# Patient Record
Sex: Male | Born: 1955 | Race: White | Hispanic: No | Smoking: Never smoker
Health system: Southern US, Community
[De-identification: ages and names within clinical notes are randomized; demographics above are authoritative.]

## PROBLEM LIST (undated history)

## (undated) DIAGNOSIS — E119 Type 2 diabetes mellitus without complications: Secondary | ICD-10-CM

## (undated) DIAGNOSIS — K649 Unspecified hemorrhoids: Secondary | ICD-10-CM

## (undated) DIAGNOSIS — I10 Essential (primary) hypertension: Secondary | ICD-10-CM

## (undated) DIAGNOSIS — K219 Gastro-esophageal reflux disease without esophagitis: Secondary | ICD-10-CM

## (undated) DIAGNOSIS — S82244A Nondisplaced spiral fracture of shaft of right tibia, initial encounter for closed fracture: Secondary | ICD-10-CM

## (undated) DIAGNOSIS — F329 Major depressive disorder, single episode, unspecified: Secondary | ICD-10-CM

## (undated) DIAGNOSIS — F32A Depression, unspecified: Secondary | ICD-10-CM

## (undated) DIAGNOSIS — D369 Benign neoplasm, unspecified site: Secondary | ICD-10-CM

## (undated) DIAGNOSIS — D509 Iron deficiency anemia, unspecified: Secondary | ICD-10-CM

## (undated) DIAGNOSIS — H3530 Unspecified macular degeneration: Secondary | ICD-10-CM

## (undated) DIAGNOSIS — C61 Malignant neoplasm of prostate: Secondary | ICD-10-CM

## (undated) DIAGNOSIS — M109 Gout, unspecified: Secondary | ICD-10-CM

## (undated) HISTORY — PX: INGUINAL HERNIA REPAIR: SUR1180

## (undated) HISTORY — DX: Nondisplaced spiral fracture of shaft of right tibia, initial encounter for closed fracture: S82.244A

## (undated) HISTORY — DX: Iron deficiency anemia, unspecified: D50.9

## (undated) HISTORY — DX: Malignant neoplasm of prostate: C61

## (undated) HISTORY — DX: Major depressive disorder, single episode, unspecified: F32.9

## (undated) HISTORY — DX: Benign neoplasm, unspecified site: D36.9

## (undated) HISTORY — DX: Depression, unspecified: F32.A

## (undated) HISTORY — DX: Unspecified hemorrhoids: K64.9

## (undated) HISTORY — DX: Unspecified macular degeneration: H35.30

## (undated) HISTORY — PX: PROSTATE BIOPSY: SHX241

## (undated) HISTORY — DX: Gastro-esophageal reflux disease without esophagitis: K21.9

---

## 1979-03-19 HISTORY — PX: OTHER SURGICAL HISTORY: SHX169

## 1997-07-14 ENCOUNTER — Other Ambulatory Visit: Admission: RE | Admit: 1997-07-14 | Discharge: 1997-07-14 | Payer: Self-pay | Admitting: Family Medicine

## 1997-08-26 ENCOUNTER — Emergency Department (HOSPITAL_COMMUNITY): Admission: EM | Admit: 1997-08-26 | Discharge: 1997-08-26 | Payer: Self-pay

## 1998-02-07 ENCOUNTER — Emergency Department (HOSPITAL_COMMUNITY): Admission: EM | Admit: 1998-02-07 | Discharge: 1998-02-07 | Payer: Self-pay | Admitting: Emergency Medicine

## 1999-05-02 ENCOUNTER — Encounter: Admission: RE | Admit: 1999-05-02 | Discharge: 1999-05-02 | Payer: Self-pay | Admitting: Internal Medicine

## 1999-12-04 ENCOUNTER — Encounter: Admission: RE | Admit: 1999-12-04 | Discharge: 1999-12-04 | Payer: Self-pay | Admitting: Internal Medicine

## 1999-12-24 ENCOUNTER — Encounter: Payer: Self-pay | Admitting: Gastroenterology

## 1999-12-24 ENCOUNTER — Ambulatory Visit (HOSPITAL_COMMUNITY): Admission: RE | Admit: 1999-12-24 | Discharge: 1999-12-24 | Payer: Self-pay | Admitting: Gastroenterology

## 2000-01-12 ENCOUNTER — Emergency Department (HOSPITAL_COMMUNITY): Admission: EM | Admit: 2000-01-12 | Discharge: 2000-01-12 | Payer: Self-pay | Admitting: *Deleted

## 2000-04-01 ENCOUNTER — Inpatient Hospital Stay (HOSPITAL_COMMUNITY): Admission: EM | Admit: 2000-04-01 | Discharge: 2000-04-03 | Payer: Self-pay | Admitting: Internal Medicine

## 2000-04-02 ENCOUNTER — Encounter: Payer: Self-pay | Admitting: Internal Medicine

## 2000-06-17 ENCOUNTER — Encounter: Payer: Self-pay | Admitting: Family Medicine

## 2000-06-17 ENCOUNTER — Encounter: Admission: RE | Admit: 2000-06-17 | Discharge: 2000-06-17 | Payer: Self-pay | Admitting: Family Medicine

## 2000-08-14 ENCOUNTER — Ambulatory Visit (HOSPITAL_COMMUNITY): Admission: RE | Admit: 2000-08-14 | Discharge: 2000-08-14 | Payer: Self-pay | Admitting: Internal Medicine

## 2000-09-29 ENCOUNTER — Emergency Department (HOSPITAL_COMMUNITY): Admission: EM | Admit: 2000-09-29 | Discharge: 2000-09-29 | Payer: Self-pay | Admitting: Emergency Medicine

## 2000-09-30 ENCOUNTER — Emergency Department (HOSPITAL_COMMUNITY): Admission: EM | Admit: 2000-09-30 | Discharge: 2000-09-30 | Payer: Self-pay | Admitting: Emergency Medicine

## 2000-11-11 ENCOUNTER — Ambulatory Visit (HOSPITAL_COMMUNITY): Admission: RE | Admit: 2000-11-11 | Discharge: 2000-11-11 | Payer: Self-pay | Admitting: Internal Medicine

## 2000-11-11 ENCOUNTER — Encounter: Payer: Self-pay | Admitting: Internal Medicine

## 2001-03-29 ENCOUNTER — Encounter: Payer: Self-pay | Admitting: Emergency Medicine

## 2001-03-29 ENCOUNTER — Emergency Department (HOSPITAL_COMMUNITY): Admission: EM | Admit: 2001-03-29 | Discharge: 2001-03-29 | Payer: Self-pay | Admitting: Emergency Medicine

## 2001-05-26 ENCOUNTER — Emergency Department (HOSPITAL_COMMUNITY): Admission: EM | Admit: 2001-05-26 | Discharge: 2001-05-26 | Payer: Self-pay

## 2001-06-01 ENCOUNTER — Emergency Department (HOSPITAL_COMMUNITY): Admission: EM | Admit: 2001-06-01 | Discharge: 2001-06-01 | Payer: Self-pay | Admitting: Emergency Medicine

## 2001-08-09 ENCOUNTER — Inpatient Hospital Stay (HOSPITAL_COMMUNITY): Admission: EM | Admit: 2001-08-09 | Discharge: 2001-08-11 | Payer: Self-pay | Admitting: Emergency Medicine

## 2001-08-09 ENCOUNTER — Encounter: Payer: Self-pay | Admitting: Emergency Medicine

## 2001-10-29 ENCOUNTER — Emergency Department (HOSPITAL_COMMUNITY): Admission: EM | Admit: 2001-10-29 | Discharge: 2001-10-29 | Payer: Self-pay | Admitting: Emergency Medicine

## 2001-11-24 ENCOUNTER — Emergency Department (HOSPITAL_COMMUNITY): Admission: EM | Admit: 2001-11-24 | Discharge: 2001-11-24 | Payer: Self-pay | Admitting: Emergency Medicine

## 2001-11-24 ENCOUNTER — Encounter: Payer: Self-pay | Admitting: Emergency Medicine

## 2001-12-07 ENCOUNTER — Encounter: Payer: Self-pay | Admitting: Emergency Medicine

## 2001-12-07 ENCOUNTER — Emergency Department (HOSPITAL_COMMUNITY): Admission: EM | Admit: 2001-12-07 | Discharge: 2001-12-07 | Payer: Self-pay | Admitting: Emergency Medicine

## 2002-01-06 ENCOUNTER — Emergency Department (HOSPITAL_COMMUNITY): Admission: EM | Admit: 2002-01-06 | Discharge: 2002-01-06 | Payer: Self-pay | Admitting: Emergency Medicine

## 2002-05-05 ENCOUNTER — Emergency Department (HOSPITAL_COMMUNITY): Admission: EM | Admit: 2002-05-05 | Discharge: 2002-05-05 | Payer: Self-pay | Admitting: Emergency Medicine

## 2002-09-04 ENCOUNTER — Encounter: Payer: Self-pay | Admitting: Emergency Medicine

## 2002-09-04 ENCOUNTER — Emergency Department (HOSPITAL_COMMUNITY): Admission: EM | Admit: 2002-09-04 | Discharge: 2002-09-04 | Payer: Self-pay | Admitting: Emergency Medicine

## 2002-10-20 ENCOUNTER — Emergency Department (HOSPITAL_COMMUNITY): Admission: EM | Admit: 2002-10-20 | Discharge: 2002-10-20 | Payer: Self-pay | Admitting: Emergency Medicine

## 2002-12-08 ENCOUNTER — Emergency Department (HOSPITAL_COMMUNITY): Admission: EM | Admit: 2002-12-08 | Discharge: 2002-12-08 | Payer: Self-pay | Admitting: Emergency Medicine

## 2002-12-09 ENCOUNTER — Encounter: Payer: Self-pay | Admitting: Emergency Medicine

## 2002-12-09 ENCOUNTER — Emergency Department (HOSPITAL_COMMUNITY): Admission: AD | Admit: 2002-12-09 | Discharge: 2002-12-09 | Payer: Self-pay | Admitting: Emergency Medicine

## 2003-01-20 ENCOUNTER — Encounter: Admission: RE | Admit: 2003-01-20 | Discharge: 2003-01-20 | Payer: Self-pay | Admitting: Family Medicine

## 2003-01-24 ENCOUNTER — Encounter: Admission: RE | Admit: 2003-01-24 | Discharge: 2003-01-24 | Payer: Self-pay | Admitting: Family Medicine

## 2004-09-06 ENCOUNTER — Ambulatory Visit: Payer: Self-pay | Admitting: Nurse Practitioner

## 2005-10-25 ENCOUNTER — Emergency Department (HOSPITAL_COMMUNITY): Admission: EM | Admit: 2005-10-25 | Discharge: 2005-10-25 | Payer: Self-pay | Admitting: Emergency Medicine

## 2006-04-24 ENCOUNTER — Emergency Department (HOSPITAL_COMMUNITY): Admission: EM | Admit: 2006-04-24 | Discharge: 2006-04-24 | Payer: Self-pay | Admitting: Emergency Medicine

## 2007-06-18 ENCOUNTER — Ambulatory Visit: Payer: Self-pay | Admitting: Family Medicine

## 2007-06-18 LAB — CONVERTED CEMR LAB
ALT: 45 units/L (ref 0–53)
AST: 27 units/L (ref 0–37)
Albumin: 4.8 g/dL (ref 3.5–5.2)
Alkaline Phosphatase: 89 units/L (ref 39–117)
BUN: 17 mg/dL (ref 6–23)
Basophils Absolute: 0 10*3/uL (ref 0.0–0.1)
Basophils Relative: 0 % (ref 0–1)
CO2: 21 meq/L (ref 19–32)
Calcium: 9.9 mg/dL (ref 8.4–10.5)
Chlamydia, Swab/Urine, PCR: NEGATIVE
Chloride: 101 meq/L (ref 96–112)
Creatinine, Ser: 0.96 mg/dL (ref 0.40–1.50)
Eosinophils Absolute: 0.2 10*3/uL (ref 0.0–0.7)
Eosinophils Relative: 2 % (ref 0–5)
GC Probe Amp, Urine: NEGATIVE
Glucose, Bld: 105 mg/dL — ABNORMAL HIGH (ref 70–99)
HCT: 52 % (ref 39.0–52.0)
Hemoglobin: 17.9 g/dL — ABNORMAL HIGH (ref 13.0–17.0)
Lymphocytes Relative: 25 % (ref 12–46)
Lymphs Abs: 2 10*3/uL (ref 0.7–4.0)
MCHC: 34.4 g/dL (ref 30.0–36.0)
MCV: 85.1 fL (ref 78.0–100.0)
Monocytes Absolute: 0.7 10*3/uL (ref 0.1–1.0)
Monocytes Relative: 9 % (ref 3–12)
Neutro Abs: 5.1 10*3/uL (ref 1.7–7.7)
Neutrophils Relative %: 64 % (ref 43–77)
PSA: 4.29 ng/mL — ABNORMAL HIGH (ref 0.10–4.00)
Platelets: 269 10*3/uL (ref 150–400)
Potassium: 3.9 meq/L (ref 3.5–5.3)
RBC: 6.11 M/uL — ABNORMAL HIGH (ref 4.22–5.81)
RDW: 13.2 % (ref 11.5–15.5)
Sodium: 137 meq/L (ref 135–145)
Total Bilirubin: 0.7 mg/dL (ref 0.3–1.2)
Total Protein: 7.8 g/dL (ref 6.0–8.3)
WBC: 8 10*3/uL (ref 4.0–10.5)

## 2008-04-18 ENCOUNTER — Inpatient Hospital Stay (HOSPITAL_COMMUNITY): Admission: EM | Admit: 2008-04-18 | Discharge: 2008-04-26 | Payer: Self-pay | Admitting: Emergency Medicine

## 2008-08-18 ENCOUNTER — Ambulatory Visit: Payer: Self-pay | Admitting: Family Medicine

## 2008-08-18 LAB — CONVERTED CEMR LAB
BUN: 18 mg/dL (ref 6–23)
CO2: 25 meq/L (ref 19–32)
Calcium: 9.7 mg/dL (ref 8.4–10.5)
Chloride: 103 meq/L (ref 96–112)
Creatinine, Ser: 0.95 mg/dL (ref 0.40–1.50)
Glucose, Bld: 130 mg/dL — ABNORMAL HIGH (ref 70–99)
Microalb, Ur: 2.18 mg/dL — ABNORMAL HIGH (ref 0.00–1.89)
PSA: 4.6 ng/mL — ABNORMAL HIGH (ref 0.10–4.00)
Potassium: 3.9 meq/L (ref 3.5–5.3)
Sodium: 142 meq/L (ref 135–145)
Vit D, 25-Hydroxy: 56 ng/mL (ref 30–89)

## 2009-07-14 ENCOUNTER — Ambulatory Visit: Payer: Self-pay | Admitting: Family Medicine

## 2009-08-16 DIAGNOSIS — C61 Malignant neoplasm of prostate: Secondary | ICD-10-CM

## 2009-08-16 HISTORY — DX: Malignant neoplasm of prostate: C61

## 2009-09-07 ENCOUNTER — Ambulatory Visit: Payer: Self-pay | Admitting: Family Medicine

## 2010-04-07 ENCOUNTER — Encounter: Payer: Self-pay | Admitting: Family Medicine

## 2010-07-03 LAB — BASIC METABOLIC PANEL
BUN: 10 mg/dL (ref 6–23)
BUN: 9 mg/dL (ref 6–23)
CO2: 29 mEq/L (ref 19–32)
Calcium: 8.9 mg/dL (ref 8.4–10.5)
Chloride: 103 mEq/L (ref 96–112)
Glucose, Bld: 103 mg/dL — ABNORMAL HIGH (ref 70–99)
Glucose, Bld: 95 mg/dL (ref 70–99)
Potassium: 3.9 mEq/L (ref 3.5–5.1)
Potassium: 4 mEq/L (ref 3.5–5.1)
Sodium: 135 mEq/L (ref 135–145)

## 2010-07-03 LAB — URINALYSIS, ROUTINE W REFLEX MICROSCOPIC
Bilirubin Urine: NEGATIVE
Glucose, UA: NEGATIVE mg/dL
Hgb urine dipstick: NEGATIVE
Ketones, ur: NEGATIVE mg/dL
Nitrite: NEGATIVE
Protein, ur: NEGATIVE mg/dL
Specific Gravity, Urine: 1.027 (ref 1.005–1.030)
Urobilinogen, UA: 0.2 mg/dL (ref 0.0–1.0)
pH: 5.5 (ref 5.0–8.0)

## 2010-07-03 LAB — POCT I-STAT, CHEM 8
BUN: 23 mg/dL (ref 6–23)
Calcium, Ion: 1.02 mmol/L — ABNORMAL LOW (ref 1.12–1.32)
Chloride: 107 mEq/L (ref 96–112)
Creatinine, Ser: 0.9 mg/dL (ref 0.4–1.5)
Glucose, Bld: 109 mg/dL — ABNORMAL HIGH (ref 70–99)
HCT: 56 % — ABNORMAL HIGH (ref 39.0–52.0)
Hemoglobin: 19 g/dL — ABNORMAL HIGH (ref 13.0–17.0)
Potassium: 3.5 mEq/L (ref 3.5–5.1)
Sodium: 138 mEq/L (ref 135–145)
TCO2: 23 mmol/L (ref 0–100)

## 2010-07-03 LAB — COMPREHENSIVE METABOLIC PANEL
ALT: 29 U/L (ref 0–53)
AST: 20 U/L (ref 0–37)
Albumin: 3 g/dL — ABNORMAL LOW (ref 3.5–5.2)
Alkaline Phosphatase: 61 U/L (ref 39–117)
BUN: 12 mg/dL (ref 6–23)
CO2: 25 mEq/L (ref 19–32)
Calcium: 8.3 mg/dL — ABNORMAL LOW (ref 8.4–10.5)
Chloride: 106 mEq/L (ref 96–112)
Creatinine, Ser: 0.89 mg/dL (ref 0.4–1.5)
GFR calc Af Amer: >60 mL/min (ref >60)
GFR calc non Af Amer: >60 mL/min (ref >60)
Glucose, Bld: 89 mg/dL (ref 70–99)
Potassium: 3.6 mEq/L (ref 3.5–5.1)
Sodium: 138 mEq/L (ref 135–145)
Total Bilirubin: 0.9 mg/dL (ref 0.3–1.2)
Total Protein: 5.5 g/dL — ABNORMAL LOW (ref 6.0–8.3)

## 2010-07-03 LAB — ETHANOL: Alcohol, Ethyl (B): 6 mg/dL (ref 0–10)

## 2010-07-03 LAB — CBC
HCT: 42.8 % (ref 39.0–52.0)
HCT: 51.8 % (ref 39.0–52.0)
Hemoglobin: 14.4 g/dL (ref 13.0–17.0)
Hemoglobin: 17.6 g/dL — ABNORMAL HIGH (ref 13.0–17.0)
MCHC: 33.8 g/dL (ref 30.0–36.0)
MCHC: 34 g/dL (ref 30.0–36.0)
MCV: 83.4 fL (ref 78.0–100.0)
MCV: 84.3 fL (ref 78.0–100.0)
Platelets: 215 10*3/uL (ref 150–400)
Platelets: 220 10*3/uL (ref 150–400)
RBC: 5.08 MIL/uL (ref 4.22–5.81)
RBC: 6.21 MIL/uL — ABNORMAL HIGH (ref 4.22–5.81)
RDW: 13.9 % (ref 11.5–15.5)
RDW: 14.4 % (ref 11.5–15.5)
WBC: 6.6 10*3/uL (ref 4.0–10.5)
WBC: 6.6 10*3/uL (ref 4.0–10.5)

## 2010-07-03 LAB — PROTIME-INR
INR: 0.9 (ref 0.00–1.49)
Prothrombin Time: 12.7 seconds (ref 11.6–15.2)

## 2010-07-03 LAB — TYPE AND SCREEN
ABO/RH(D): O POS
Antibody Screen: NEGATIVE

## 2010-07-03 LAB — POCT CARDIAC MARKERS
CKMB, poc: 3.6 ng/mL (ref 1.0–8.0)
Myoglobin, poc: 71.4 ng/mL (ref 12–200)
Troponin i, poc: <0.05 ng/mL (ref 0.00–0.09)

## 2010-07-03 LAB — RETICULOCYTES
RBC.: 5.26 MIL/uL (ref 4.22–5.81)
Retic Count, Absolute: 68.4 10*3/uL (ref 19.0–186.0)
Retic Ct Pct: 1.3 % (ref 0.4–3.1)

## 2010-07-03 LAB — RAPID URINE DRUG SCREEN, HOSP PERFORMED
Amphetamines: NOT DETECTED
Barbiturates: NOT DETECTED
Benzodiazepines: POSITIVE — AB
Cocaine: NOT DETECTED
Opiates: NOT DETECTED
Tetrahydrocannabinol: NOT DETECTED

## 2010-07-03 LAB — LIPID PANEL
Cholesterol: 156 mg/dL (ref 0–200)
HDL: 25 mg/dL — ABNORMAL LOW (ref >39)
LDL Cholesterol: 105 mg/dL — ABNORMAL HIGH (ref 0–99)
Total CHOL/HDL Ratio: 6.2 RATIO
Triglycerides: 129 mg/dL (ref <150)
VLDL: 26 mg/dL (ref 0–40)

## 2010-07-03 LAB — T4, FREE: Free T4: 0.85 ng/dL — ABNORMAL LOW (ref 0.89–1.80)

## 2010-07-03 LAB — ABO/RH: ABO/RH(D): O POS
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
## 2010-07-31 NOTE — Discharge Summary (Signed)
[name], [name]                  ACCOUNT NO.:  [id number]   MEDICAL RECORD NO.:  [id number]          PATIENT TYPE:  INP   LOCATION:  3030                         FACILITY:  [facility]   PHYSICIAN:  Monte Fantasia, MD  DATE OF BIRTH:  [birth year]   DATE OF ADMISSION:  04/18/2008  DATE OF DISCHARGE:                               DISCHARGE SUMMARY   ADDENDUM:  Please refer to the prior discharge summary dictated on  April 22, 2008.  This is an addendum to the same.   PRIMARY CARE PHYSICIAN:  Unassigned.   DISCHARGE DIAGNOSES:  1. Syncope.  2. Status post fall.  3. Post-concussion disorder.  4. Chronic headaches.  5. History of polio of the right leg and spasticity of the left leg.  6. Dizziness secondary to post-concussion.  7. Memory dysfunction .  8. Bipolar affective disorder.  9. Bleeding secondary to concussion.   DISCHARGE MEDICATIONS:  1. Metoprolol 50 mg p.o. b.i.d.  2. Benicar 40 mg p.o. daily.  3. Vicodin 5/325 mg 2 tablets p.o. q.i.d. p.r.n. same.  4. Hold Ativan 1-2 mg p.o. t.i.d. p.r.n. agitation.   HOSPITAL COURSE:  Please refer to the prior discharge summary dictated  on April 22, 2008 for the course during the hospital stay.  This is an  addendum to the same.  Over the past 3 days, the patient has been  awaiting for skilled nursing home placement.  The patient received  physical therapy and speech therapy.  The patient's the mobility and  pain has improved over the past 3 days.  The patient at present is  medically stable to be discharged to a skilled nursing facility and we  will discharge the same.   PHYSICAL EXAMINATION:  VITAL SIGNS:  Temperature 97.6, pulse of 51,  respirations 20, blood pressure 119/72.  NECK:  Supple.  HEENT:  Pupils are equal and reacting to light.  No pallor, no  lymphadenopathy.  RESPIRATORY:  Air entry is bilaterally equal.  No  rales or rhonchi.  CARDIOVASCULAR:  S1 and S2, regular rate and rhythm.  ABDOMEN:  Soft.  No  guarding.  No rigidity.  No tenderness.  EXTREMITIES:  No edema of feet.   LABORATORY DATA:  No new labs.   DIAGNOSTICS:  Please refer to the prior discharge summary dictated for  the same.   DISPOSITION:  The patient is stable to be discharged to the skilled  nursing facility.  As per the Neurology recommendations, would avoid  narcotics long term and use of nonsteroidal anti-inflammatory agents.  Recommend heating pad for lower back pain control and physical therapy  with rehab.  The patient is medically stable to be discharged to the  nursing home and is awaiting discharge.      Monte Fantasia, MD  Electronically Signed     MP/MEDQ  D:  04/26/2008  T:  04/26/2008  Job:  [id number]
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
## 2010-08-03 NOTE — Cardiovascular Report (Signed)
[place]. [facility]  Patient:    [name], [name]                    MRN: [id number] Proc. Date: 04/03/00 Adm. Date:  [id number] Disc. Date: [id number] Attending:  Gwynneth Aliment CC:         Velna Hatchet, M.D.  CP aboratory  Madaline Savage, M.D.   Cardiac Catheterization  PROCEDURE:  Retrograde central aortic catheterization, selective coronary angiography by Judkins technique, left ventricular angiogram by RAO and LAO projections, subselective left internal mammary artery, abdominal aortic angiogram, mid stream pulmonary artery projection, hand injection, Perclose 6 French arterial closure.  DESCRIPTION OF PROCEDURE:  The above procedure was done with the patient in the postabsorptive state after 5 mg of Valium p.o. premedication at [facility] lab #1.  The RFA was entered with a single anterior puncture using 1% Xylocaine as anesthesia and modified Seldinger technique with an 18 thin-walled needle and insertion of a short 6 French side-arm sheath.  Versed 2 mg were given for sedation.  The patient was quite talkative but cooperative and oriented throughout the procedure which he tolerated well.  Catheters were 6 French 4 cm taper, Cordis preformed coronary and pigtail catheters using Hexabrix dye throughout the procedure.  LV angiogram was done in the RAO and LAO projection at 25 cc, 14 cc/sec., 20 cc at 12 cc/sec., respectively, with pullback pressure of the CA showing no gradient across the aortic valve.  Subselective LIMA was done with the right coronary catheter. Hand injection above the level of the renal arteries was done with the pigtail catheter.  The catheter was removed.  Side-arm sheath was flushed. Arteriotomy was closed successfully with a 6 [place] single staged Perclose device.  The patient was transferred to the holding area for postoperative care in stable condition and will be transferred back to [facility] for postoperative care and  monitoring.  He tolerated the procedure well.  PRESSURES:  LV:  130/0, LVEDP 14-16 mmHg. CA:  130/90 mmHg.  There was no gradient across the aortic valve on catheter pullback.  FLUOROSCOPY:  Fluoroscopy did not reveal any coronary ______ intracardiac or valvular calcification.  The LIMA was widely patent with normal subclavian and normal antegrade left vertebral flow.  The renal arteries were single bilaterally and normal and the suprarenal and infrarenal aorta appeared normal.  LEFT VENTRICULAR ANGIOGRAM:  LV angiogram in the RAO and LAO projections showed a vigorously contracting ventricle with EF greater than 55%.  No segmental wall motion abnormality with ______ to speech and mild angiographic LVH.  There was mitral regurgitation.  The main left coronary was normal.  The left anterior descending artery was widely patent, smooth and normal, slightly tapered in the distal third.  There was one area of approximately 30% concentric narrowing of the LAD just beyond the second septal perforator branch near the junction of the mid LAD.  This was used in multiple projections, did not change after nitroglycerin and was considered a mild narrowing with normal flow.  The large first diagonal branch was widely patent, smooth, and normal, bifurcated and arose before the first septal perforator.  The circumflex was nondominant, of moderate size, comprised of two large tortuous marginal branches that were normal and normal atrial and ABG branch.  The right coronary was a large dominant vessel that was widely patent, smooth and normal throughout its course with normal PDA and PLA.  DISCUSSION:  A 55 year old, single, disabled gentleman who has  a history of anxiety and depression, macular degeneration and systemic hypertension.  He is a non smoker.  He was admitted to the hospital with chest pain without myocardial infarction.  There was strong risk factors including a strong family  history of coronary disease, along with the patients hypertension.  He was seen in consultation by Dr. Elsie Lincoln who recommended diagnostic catheterization for further evaluation.  Fortunately, the patient has essentially normal coronary arteries and LV and he is a non smoker.  He does have some very minor disease in the junction of the proximal and mid-third of the LAD where there is some mild concentric narrowing that does not appear to be hemodynamically significant, but may be early atherosclerotic lesion.  I would recommend medical therapy of this.  In view of this, and his hypertension and the patients resting tachycardia, I would use beta blockade therapy if possible and careful attention to a lipid-lowering therapy.  CATHETERIZATION DIAGNOSES: 1. Chest pain, etiology not determined. 2. Possible gastroesophageal reflux disease. 3. Anxiety and depression. 4. Systemic hypertension. 5. Essentially normal coronary arteries (mild left anterior descending    disease). 6. Possible manic depressive disorder. 7. Lipid status to be determined. DD:  04/03/00 TD:  04/04/00 Job: [id number]

## 2010-08-03 NOTE — Cardiovascular Report (Signed)
[place]. [facility]  Patient:    [name], [name] Visit Number: [id number] MRN: [id number]          Service Type: MED Location: [phone] Attending Physician:  Netta Cedars Dictated by:   Delrae Rend, M.D. Proc. Date: 08/11/01 Admit Date:  08/09/2001 Discharge Date: 08/11/2001   CC:         Cala Bradford R. Renae Gloss, M.D.   Cardiac Catheterization  REFERRING PHYSICIAN: Merlene Laughter. Renae Gloss, M.D.  PROCEDURES PERFORMED: 1. Left ventriculography. 2. Selective right and left coronary arteriography. 3. Bilateral selective renal arteriography. 4. Right femoral angiography and closure of the right femoral artery access    with Perclose.  INDICATIONS: The patient is a 55 year old gentleman with a known history of coronary artery disease with 30% mid LAD stenosis, presents to the hospital with chest pain. The chest pain was suggestive of unstable angina. Given this, he was admitted to the hospital and brought back to the cardiac catheterization lab to evaluate his coronary anatomy.  Renal arteriography was performed to evaluate for renovascular hypertension as he had uncontrolled hypertension in spite of aggressive medical therapy.  HEMODYNAMIC DATA: The LV pressures were 154/6 with an end-diastolic pressure of 15 mmHg. The central aortic pressure 142/96 with a mean of 119 mmHg. There was no significant pressure gradient across the aortic valve.  LEFT VENTRICLE:  The left ventricular systolic function is preserved at 60%. There is no wall motion abnormalities. There is no mitral regurgitation.  CORONARY ARTERIES:  Right coronary artery: Right coronary artery is a large caliber vessel and dominant vessel. It gives origin to a large PLV and a larger PDA branch. They are disease free.  Left main coronary artery: Left main coronary artery is a large caliber vessel. It is a long segment vessel and gives origin to LAD and circumflex coronary  artery.  Left anterior descending artery. Left anterior descending artery is a large caliber vessel.  It gives origin to a very large diagonal #1, which acts likes an optional LAD. The LAD entered the apex. It gives origin to two large septal perforators. Just after the origin of the optional LAD (diagonal #1, there is a 20-30% smooth narrowing noted, which may represent negative remodeling). There is no obvious plaque noted.  Circumflex coronary artery:  Circumflex coronary artery is a nondominant vessel; however, a large caliber vessel. It gives origin to a very large obtuse marginal #1 and obtuse marginal #2 and continues in the AV groove circumflex, which is very small.  Right and left selective renal arteriography. The renal arteriography revealed widely patent renal arteries with no evidence of renal artery stenosis.  Right femoral angiography.  Good arterial axis site.  The artery axis site was attempted to close with Perclose, but there was inability to properly close, and hence the procedure was unsuccessful. Manual pressure was held in the cardiac catheterization lab. Adequate hemostasis was obtained. The patient was transferred to the ward in stable condition.  TECHNIQUE OF THE PROCEDURE: Under the usual sterile precautions using a 6 French left femoral artery access, a 6 [place] multipurpose B2 catheter was advanced in the ascending aorta over a 0.035 mm J wire. The catheter was gently advanced in the left ventricle and left ventricular pressures were monitored. Hand contrast injections of the left ventricle was performed both in LAO and RAO projections. The catheter was flushed with saline, pulled back in the ascending aorta and pressure gradient of the ______ valve was monitored. The right  coronary artery was selective engaged and arteriography was performed. In a similar fashion, the left main coronary artery was selectively engaged and arteriography was performed. Then  the catheter was pulled back in the abdominal aorta. Selective right and left renal arteriography was performed and the catheter was pulled out of the body in the usual fashion. Right femoral angiography was performed through the arterial axis sheath, and after confirming the good axis, Perclose was attempted. Because of inability to close, manual pressure was held. Adequate hemostasis obtained. The patient was transferred to the ward in stable condition. Dictated by:   Delrae Rend, M.D. Attending Physician:  Netta Cedars DD:  08/11/01 TD:  08/12/01 Job: [id number]

## 2010-08-03 NOTE — Discharge Summary (Signed)
[name]. [facility]  Patient:    [name], [name] Visit Number: [id number] MRN: [id number]          Service Type: MED Location: [phone] Attending Physician:  Netta Cedars Dictated by:   Halford Decamp Delanna Ahmadi, R.N., N.P. Admit Date:  08/09/2001 Discharge Date: 08/11/2001   CC:         Cala Bradford R. Renae Gloss, M.D.   Discharge Summary  HISTORY OF PRESENT ILLNESS:  The patient is a 55 year old white single male who came to the emergency room by EMS because of chest pain. He stated he awakened yesterday with soreness in his chest and felt maybe he slept wrong and thus he went about his usual day. He drank some coffee at home, went to [facility] and to [place]. He was watching the race. He felt his chest suddenly become heavy, sharp with the pain. It was worse with increased respirations. He had no radiation. He did not have any symptoms of diaphoresis, shortness of breath or nausea or vomiting. He has had this discomfort off and on but not this bad.  He walked around in [employer]. He had no change, so 911 was called and the paramedics came and gave him sublingual nitroglycerin x 2 and he had decreased pain to 1/2; he rated it 9/10 initially. Thus he was seen in the emergency room by Dr. Arna Snipe who thought he should be admitted, ruled out for an MI, and with possible catheterization. He continued to have some intermittent chest pain, thus it was decided that he should undergo cardiac catheterization on Aug 11, 2001. He underwent catheterization. He was found only to have a 30% LAD, unchanged from a catheterization on March 29, 2000. Thus he was discharged to home.  LABORATORY DATA:  His hemoglobin was 13.6, hematocrit 39.8, WBCs 5.8, platelets 247. Sodium 137, potassium 4.2, BUN 8, glucose 105, creatinine 0.9. CK-MBs were negative x 2, troponin was negative x 2. Total cholesterol was 180, triglycerides 380, HDL 30, LDL 74. Urine was negative  for any growth.  DISCHARGE DIAGNOSES 1. Chest pain, not ischemic coronary artery disease related, status post    catheterization with only a very mild 30% LAD stenosis, unchanged from one    year ago. 2. Hypertension. 3. Anxiety, depression, bipolar disorder. 4. Macular degeneration. 5. Hyperlipidemia. 6. Premature family history of heart disease.  DISCHARGE MEDICATIONS 1. Accupril 60 mg 1 q.d. 2. Metoprolol 50 mg b.i.d. 3. Depakote 250 mg b.i.d. 4. Aspirin 81 mg 1 q.d. 5. Vioxx 25 mg q.d. x 10 days. 6. Prevacid 30 mg q.d.  DISCHARGE INSTRUCTIONS:  Activity as tolerated.  FOLLOWUP:  He is to follow up with the nurse practitioner in the office to check his groin. Otherwise he can follow up with his primary care doctor. There is no need for cardiology follow up on a long-term basis, because he basically has no significant coronary artery disease at this time. Dictated by:   Halford Decamp Delanna Ahmadi, R.N., N.P. Attending Physician:  Netta Cedars DD:  09/02/01 TD:  09/03/01 Job: [id number]

## 2010-08-03 NOTE — Discharge Summary (Signed)
[facility]  Patient:    [name], [name]                    MRN: [id number] Adm. Date:  04/01/00 Disc. Date: 04/03/00 Attending:  Ruta Hinds CC:         Lenise Herald, M.D.                           Discharge Summary  CHIEF COMPLAINT:  "I have chest pain."  HISTORY OF PRESENT ILLNESS:  This is a 55 year old male with a past medical history significant for hypertension, depression, and anxiety who presented to the office with substernal sharp stabbing chest pain. He states that at time it radiates to the epigastric area. Associated symptoms include diaphoresis and nausea. There is on shortness of breath or palpitations. The patient denied prior workup for his complaint. He denies previous history of coronary artery disease and angina. The pain is currently 5/10, no relief with Aleve, however, or Aleve with BC powder. There are no aggravating factors. The patient was transferred to [facility] for chest pain.  PAST MEDICAL HISTORY:  Anxiety, depression, hypertension.  PAST SURGICAL HISTORY:  Left ankle surgery 1982, umbilical hernia repair at age 66.  MEDICATIONS:  Norvasc 10 q.d., Depakote 250 b.i.d., Aceon 4 q.d., and Xanax 0.5 mg p.o. b.i.d.  ALLERGIES:  No known allergies.  FAMILY HISTORY:  Significant for father at 37 who had an MI at 80 and a CABG at 63. Hypertension, diabetes. Mother deceased of lung cancer. One brother and two sisters, their health is unknown.  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 Denies tobacco use. He has a history of alcohol abuse; however, he stopped drinking about 12 years ago and he denied illicit drug use.  REVIEW OF SYSTEMS:  Negative for headache, visual disturbances, ear or mouth pain, upper extremity or lower extremity weakness, or paresthesias. Abdominal pain, nausea, vomiting, diarrhea, constipation.  PHYSICAL EXAMINATION:  VITAL  SIGNS:  Temperature 99.1, heart rate 108, height 5 foot 6, ______ pounds. Blood pressure on the left 148/102, on the right 152/100. Respiratory rate of 18.  GENERAL:  Well-developed male appears agitated.  HEENT:  Normocephalic, atraumatic. ______ clear. Clear tympanic membranes, full extraocular movement without nystagmus. No oral pharyngeal lesions.  NECK:  Supple. No JVD. Normal carotid upstrokes without carotid bruits.  CHEST:  Clear to auscultation, no wheezes.  CARDIOVASCULAR:  Tachycardic without murmur. Normal S1 and S2. Questionable S4.  ABDOMEN:  Soft, nontender, nondistended with normal abdominal bowel sounds. No bruits appreciated.  RECTAL/GU:  Deferred.  EXTREMITIES:  No edema. Distal pulses palpable.  NEUROLOGIC:  He was awake and alert, oriented x 3, however, he appeared agitated. Cranial nerves II-XII are grossly intact. He moves all extremities. Had a normal gait and speaks with a lisp.  LABORATORY DATA:  Initially it EKG of normal sinus rhythm at 74 beats per minute. No acute ST/T wave changes. Initial labs in the hospital showed a cholesterol of 211, triglycerides 157. HDL 138, LDL of 142. CK 165 with an MB of 3.6. Negative troponin. White count 8.7, hemoglobin 16.5, hematocrit 45.2, platelets 312. Normal BMP and normal LFTs.  PLAN:  Admit this 55 year old male with cardiac risk factors including family history and high cholesterol with a sedentary lifestyle and uncontrolled hypertension. Telemetry to rule out myocardial infarction. He was admitted to telemetry. Cardiology was consulted to see the  patient. He was started on aspirin and a Beta blocker to decrease his heart rate. For his depression and  anxiety, Depakote and Xanax was continued. Hypertension was uncontrolled on admission. Norvasc and Zestril were continued. Norvasc 10 q.d. and Zestril 20 q.d. Lopressor was added to help control the blood pressure and the heart rate. On the next day, January  16, the patient was comfortable without chest pain overnight. He was then seen by cardiology who recommended that the patient have a cardiac cath. It was scheduled for January 17 and the patient was found to have mild coronary artery disease that could be medically managed. He was discharged on the 17th in stable condition. He was to follow-up with Dr. Allyne Gee on January 24 at 11:15 a.m. and cardiology as needed. He was discharged in stable condition.  DISCHARGE MEDICATIONS: 1. Depakote 250 p.o. b.i.d. 2. Xanax 0.5 mg p.o. b.i.d. 3. Lopressor 50 mg p.o. b.i.d. 4. Norvasc 10 mg p.o. q.d. 5. Zestril 20 mg p.o. q.d. 6. Nexium 40 mg p.o. q.d. 7. Aspirin 81 mg p.o. q.d. 8. Lipitor was added for his hypercholesterolemia at 10 mg p.o. q.d.  DISCHARGE DIAGNOSES: 1. Chest pain. 2. Urinary retention. 3. Hypertension. 4. Depression. 5. Anxiety. 6. Hyperlipidemia. DD:  05/14/00 TD:  05/15/00 Job: [id number]

## 2011-04-02 DIAGNOSIS — C61 Malignant neoplasm of prostate: Secondary | ICD-10-CM | POA: Diagnosis not present

## 2011-04-02 DIAGNOSIS — F329 Major depressive disorder, single episode, unspecified: Secondary | ICD-10-CM | POA: Diagnosis not present

## 2011-04-02 DIAGNOSIS — F411 Generalized anxiety disorder: Secondary | ICD-10-CM | POA: Diagnosis not present

## 2011-04-02 DIAGNOSIS — Z0389 Encounter for observation for other suspected diseases and conditions ruled out: Secondary | ICD-10-CM | POA: Diagnosis not present

## 2011-04-02 DIAGNOSIS — I10 Essential (primary) hypertension: Secondary | ICD-10-CM | POA: Diagnosis not present

## 2011-04-03 DIAGNOSIS — Z0000 Encounter for general adult medical examination without abnormal findings: Secondary | ICD-10-CM | POA: Diagnosis not present

## 2011-04-03 DIAGNOSIS — E559 Vitamin D deficiency, unspecified: Secondary | ICD-10-CM | POA: Diagnosis not present

## 2011-04-03 DIAGNOSIS — C61 Malignant neoplasm of prostate: Secondary | ICD-10-CM | POA: Diagnosis not present

## 2011-04-03 DIAGNOSIS — F411 Generalized anxiety disorder: Secondary | ICD-10-CM | POA: Diagnosis not present

## 2011-04-03 DIAGNOSIS — Z79899 Other long term (current) drug therapy: Secondary | ICD-10-CM | POA: Diagnosis not present

## 2011-04-03 DIAGNOSIS — I10 Essential (primary) hypertension: Secondary | ICD-10-CM | POA: Diagnosis not present

## 2011-04-18 DIAGNOSIS — L570 Actinic keratosis: Secondary | ICD-10-CM | POA: Diagnosis not present

## 2011-04-18 DIAGNOSIS — L259 Unspecified contact dermatitis, unspecified cause: Secondary | ICD-10-CM | POA: Diagnosis not present

## 2011-04-18 DIAGNOSIS — L219 Seborrheic dermatitis, unspecified: Secondary | ICD-10-CM | POA: Diagnosis not present

## 2011-04-18 DIAGNOSIS — D235 Other benign neoplasm of skin of trunk: Secondary | ICD-10-CM | POA: Diagnosis not present

## 2011-06-24 DIAGNOSIS — I739 Peripheral vascular disease, unspecified: Secondary | ICD-10-CM | POA: Diagnosis not present

## 2011-06-24 DIAGNOSIS — L84 Corns and callosities: Secondary | ICD-10-CM | POA: Diagnosis not present

## 2011-06-24 DIAGNOSIS — L608 Other nail disorders: Secondary | ICD-10-CM | POA: Diagnosis not present

## 2011-06-26 DIAGNOSIS — C61 Malignant neoplasm of prostate: Secondary | ICD-10-CM | POA: Diagnosis not present

## 2011-06-26 DIAGNOSIS — N529 Male erectile dysfunction, unspecified: Secondary | ICD-10-CM | POA: Diagnosis not present

## 2011-07-10 DIAGNOSIS — C61 Malignant neoplasm of prostate: Secondary | ICD-10-CM | POA: Diagnosis not present

## 2011-07-10 DIAGNOSIS — Z09 Encounter for follow-up examination after completed treatment for conditions other than malignant neoplasm: Secondary | ICD-10-CM | POA: Diagnosis not present

## 2011-07-12 DIAGNOSIS — R972 Elevated prostate specific antigen [PSA]: Secondary | ICD-10-CM | POA: Diagnosis not present

## 2011-07-18 DIAGNOSIS — N529 Male erectile dysfunction, unspecified: Secondary | ICD-10-CM | POA: Diagnosis not present

## 2011-07-18 DIAGNOSIS — N401 Enlarged prostate with lower urinary tract symptoms: Secondary | ICD-10-CM | POA: Diagnosis not present

## 2011-07-18 DIAGNOSIS — R339 Retention of urine, unspecified: Secondary | ICD-10-CM | POA: Diagnosis not present

## 2011-07-18 DIAGNOSIS — C61 Malignant neoplasm of prostate: Secondary | ICD-10-CM | POA: Diagnosis not present

## 2011-08-29 DIAGNOSIS — D485 Neoplasm of uncertain behavior of skin: Secondary | ICD-10-CM | POA: Diagnosis not present

## 2011-08-29 DIAGNOSIS — L570 Actinic keratosis: Secondary | ICD-10-CM | POA: Diagnosis not present

## 2011-09-25 DIAGNOSIS — R21 Rash and other nonspecific skin eruption: Secondary | ICD-10-CM | POA: Diagnosis not present

## 2011-09-27 DIAGNOSIS — L570 Actinic keratosis: Secondary | ICD-10-CM | POA: Diagnosis not present

## 2011-09-27 DIAGNOSIS — L299 Pruritus, unspecified: Secondary | ICD-10-CM | POA: Diagnosis not present

## 2011-09-27 DIAGNOSIS — L909 Atrophic disorder of skin, unspecified: Secondary | ICD-10-CM | POA: Diagnosis not present

## 2011-09-27 DIAGNOSIS — L919 Hypertrophic disorder of the skin, unspecified: Secondary | ICD-10-CM | POA: Diagnosis not present

## 2011-09-30 DIAGNOSIS — L608 Other nail disorders: Secondary | ICD-10-CM | POA: Diagnosis not present

## 2011-09-30 DIAGNOSIS — I739 Peripheral vascular disease, unspecified: Secondary | ICD-10-CM | POA: Diagnosis not present

## 2011-09-30 DIAGNOSIS — L84 Corns and callosities: Secondary | ICD-10-CM | POA: Diagnosis not present

## 2011-11-19 DIAGNOSIS — R972 Elevated prostate specific antigen [PSA]: Secondary | ICD-10-CM | POA: Diagnosis not present

## 2011-11-22 DIAGNOSIS — N529 Male erectile dysfunction, unspecified: Secondary | ICD-10-CM | POA: Diagnosis not present

## 2011-11-22 DIAGNOSIS — C61 Malignant neoplasm of prostate: Secondary | ICD-10-CM | POA: Diagnosis not present

## 2011-11-22 DIAGNOSIS — N401 Enlarged prostate with lower urinary tract symptoms: Secondary | ICD-10-CM | POA: Diagnosis not present

## 2011-12-06 DIAGNOSIS — D485 Neoplasm of uncertain behavior of skin: Secondary | ICD-10-CM | POA: Diagnosis not present

## 2011-12-06 DIAGNOSIS — L570 Actinic keratosis: Secondary | ICD-10-CM | POA: Diagnosis not present

## 2011-12-26 DIAGNOSIS — C61 Malignant neoplasm of prostate: Secondary | ICD-10-CM | POA: Diagnosis not present

## 2011-12-26 DIAGNOSIS — Z23 Encounter for immunization: Secondary | ICD-10-CM | POA: Diagnosis not present

## 2011-12-26 DIAGNOSIS — D649 Anemia, unspecified: Secondary | ICD-10-CM | POA: Diagnosis not present

## 2011-12-26 DIAGNOSIS — M549 Dorsalgia, unspecified: Secondary | ICD-10-CM | POA: Diagnosis not present

## 2011-12-26 DIAGNOSIS — I10 Essential (primary) hypertension: Secondary | ICD-10-CM | POA: Diagnosis not present

## 2012-01-02 DIAGNOSIS — L84 Corns and callosities: Secondary | ICD-10-CM | POA: Diagnosis not present

## 2012-01-02 DIAGNOSIS — I739 Peripheral vascular disease, unspecified: Secondary | ICD-10-CM | POA: Diagnosis not present

## 2012-01-02 DIAGNOSIS — L608 Other nail disorders: Secondary | ICD-10-CM | POA: Diagnosis not present

## 2012-01-03 DIAGNOSIS — C61 Malignant neoplasm of prostate: Secondary | ICD-10-CM | POA: Diagnosis not present

## 2012-01-03 DIAGNOSIS — Z09 Encounter for follow-up examination after completed treatment for conditions other than malignant neoplasm: Secondary | ICD-10-CM | POA: Diagnosis not present

## 2012-01-03 DIAGNOSIS — Z808 Family history of malignant neoplasm of other organs or systems: Secondary | ICD-10-CM | POA: Diagnosis not present

## 2012-01-03 DIAGNOSIS — F411 Generalized anxiety disorder: Secondary | ICD-10-CM | POA: Diagnosis not present

## 2012-01-03 DIAGNOSIS — I10 Essential (primary) hypertension: Secondary | ICD-10-CM | POA: Diagnosis not present

## 2012-01-03 DIAGNOSIS — Z79899 Other long term (current) drug therapy: Secondary | ICD-10-CM | POA: Diagnosis not present

## 2012-01-03 DIAGNOSIS — F329 Major depressive disorder, single episode, unspecified: Secondary | ICD-10-CM | POA: Diagnosis not present

## 2012-01-10 DIAGNOSIS — D485 Neoplasm of uncertain behavior of skin: Secondary | ICD-10-CM | POA: Diagnosis not present

## 2012-01-10 DIAGNOSIS — L570 Actinic keratosis: Secondary | ICD-10-CM | POA: Diagnosis not present

## 2012-01-23 DIAGNOSIS — I10 Essential (primary) hypertension: Secondary | ICD-10-CM | POA: Diagnosis not present

## 2012-01-23 DIAGNOSIS — F329 Major depressive disorder, single episode, unspecified: Secondary | ICD-10-CM | POA: Diagnosis not present

## 2012-01-23 DIAGNOSIS — R51 Headache: Secondary | ICD-10-CM | POA: Diagnosis not present

## 2012-01-23 DIAGNOSIS — C61 Malignant neoplasm of prostate: Secondary | ICD-10-CM | POA: Diagnosis not present

## 2012-01-24 DIAGNOSIS — C61 Malignant neoplasm of prostate: Secondary | ICD-10-CM | POA: Diagnosis not present

## 2012-01-24 DIAGNOSIS — M549 Dorsalgia, unspecified: Secondary | ICD-10-CM | POA: Diagnosis not present

## 2012-01-24 DIAGNOSIS — D649 Anemia, unspecified: Secondary | ICD-10-CM | POA: Diagnosis not present

## 2012-01-24 DIAGNOSIS — Z6832 Body mass index (BMI) 32.0-32.9, adult: Secondary | ICD-10-CM | POA: Diagnosis not present

## 2012-01-28 ENCOUNTER — Telehealth: Payer: Self-pay | Admitting: Internal Medicine

## 2012-01-28 NOTE — Telephone Encounter (Signed)
I scheduled this patient with Dr. Russella Dar, but apparently he saw Dr. Juanda Chance back in 2002. He is on the schedule for tomorrow. Is there any way he can see the P.A around the same time?

## 2012-01-28 NOTE — Telephone Encounter (Signed)
[name], this patient has to be called and see if he wants to see an extender instead. Cannot just switch him without his approval.

## 2012-01-28 NOTE — Telephone Encounter (Signed)
I spoke with Maralyn Sago at Dr. Gwenette Greet office she says its okay to schedule with the PA. Will it be the same time? I will have to notify the patient if not.

## 2012-01-28 NOTE — Telephone Encounter (Signed)
[name], he is in for tomorrow at 2:30 PM with Willette Cluster, NP

## 2012-01-29 ENCOUNTER — Encounter: Payer: Self-pay | Admitting: *Deleted

## 2012-01-29 ENCOUNTER — Ambulatory Visit: Payer: Self-pay | Admitting: Nurse Practitioner

## 2012-02-12 ENCOUNTER — Ambulatory Visit: Payer: Self-pay | Admitting: Physician Assistant

## 2012-02-12 ENCOUNTER — Encounter: Payer: Self-pay | Admitting: Physician Assistant

## 2012-02-17 ENCOUNTER — Encounter: Payer: Self-pay | Admitting: Physician Assistant

## 2012-02-17 ENCOUNTER — Ambulatory Visit (INDEPENDENT_AMBULATORY_CARE_PROVIDER_SITE_OTHER): Payer: Medicare Other | Admitting: Physician Assistant

## 2012-02-17 VITALS — BP 110/68 | HR 84 | Ht 64.25 in | Wt 202.2 lb

## 2012-02-17 DIAGNOSIS — C61 Malignant neoplasm of prostate: Secondary | ICD-10-CM | POA: Diagnosis not present

## 2012-02-17 DIAGNOSIS — F329 Major depressive disorder, single episode, unspecified: Secondary | ICD-10-CM | POA: Insufficient documentation

## 2012-02-17 DIAGNOSIS — K649 Unspecified hemorrhoids: Secondary | ICD-10-CM | POA: Diagnosis not present

## 2012-02-17 DIAGNOSIS — Z8612 Personal history of poliomyelitis: Secondary | ICD-10-CM | POA: Insufficient documentation

## 2012-02-17 DIAGNOSIS — K625 Hemorrhage of anus and rectum: Secondary | ICD-10-CM

## 2012-02-17 DIAGNOSIS — D509 Iron deficiency anemia, unspecified: Secondary | ICD-10-CM | POA: Diagnosis not present

## 2012-02-17 DIAGNOSIS — I10 Essential (primary) hypertension: Secondary | ICD-10-CM | POA: Insufficient documentation

## 2012-02-17 MED ORDER — MOVIPREP 100 G PO SOLR: 1.0000 | Freq: Once | ORAL | Status: AC

## 2012-02-17 NOTE — Progress Notes (Signed)
Subjective:    Patient ID: [name], male    DOB: [birth year], 56 y.o.   MRN: [id number]  HPI [name] is a 56 year old white male referred today by [facility]Dr Bouska's office for evaluation of microcytic anemia. He is known remotely to Dr. Lina Sar from colonoscopy done in May of 2002 which was a normal exam with exception of internal and external hemorrhoids. Patient states that he also believes that he had a colonoscopy in 2007 while he was living in [place], and may have had upper endoscopy at that same time period On further questioning he believes he was anemic at that time and received an iron infusion , by  his description. He does not think that anything was found on either of these procedures. He was recently evaluated for complaints of intermittent rectal bleeding and had labs done in October of 2013 showing a hemoglobin of 8.6, repeat labs 01/27/2012 showing hemoglobin of 8.9 hematocrit of 31 MCV of 58 platelet count within normal limits WBC 7.6. He has been placed on iron supplementation. He was told that he had prostate cancer about a year and a half ago, he says this is early stage and he has not had any treatment as yet  Because  he has been delaying it. He has some abdominal pain changes in his bowel habits, no nausea vomiting heartburn indigestion or dysphagia. His appetite has been good and his weight has been stable area He has not noted any melena but has seen mild amount of bright red blood on the tissue and occasionally with dripping into the commode with the bowel movement. He says he has hemorrhoids that are generally not painful. He takes a baby aspirin once daily denies any other NSAID use   Review of Systems  Constitutional: Negative.   HENT: Negative.   Eyes: Negative.   Cardiovascular: Negative.   Gastrointestinal: Positive for anal bleeding.  Genitourinary: Negative.   Musculoskeletal: Positive for gait problem.  Neurological: Negative.     Hematological: Negative.   Psychiatric/Behavioral: Negative.    Outpatient Prescriptions Prior to Visit  Medication Sig Dispense Refill  . divalproex (DEPAKOTE) 500 MG DR tablet Take 500 mg by mouth 3 (three) times daily. Take 1 tab      . famotidine (PEPCID) 20 MG tablet Take 20 mg by mouth 2 (two) times daily. Take 1 tab as needed.      Marland Kitchen albuterol (PROVENTIL HFA;VENTOLIN HFA) 108 (90 BASE) MCG/ACT inhaler Inhale 2 puffs into the lungs every 4 (four) hours as needed.      . Diltiazem HCl Coated Beads (DILTIAZEM HCL ER COATED BEADS PO) Take 1 tablet by mouth.      . fluticasone (VERAMYST) 27.5 MCG/SPRAY nasal spray Place 2 sprays into the nose daily.      . metoprolol succinate (TOPROL-XL) 25 MG 24 hr tablet Take 25 mg by mouth daily.        No Known Allergies    Patient Active Problem List  Diagnosis  . Microcytic anemia  . Hemorrhoids  . Prostate cancer  . H/O post-polio syndrome  . HTN (hypertension)  . Depressive disorder   History  Substance Use Topics  . Smoking status: Never Smoker   . Smokeless tobacco: Never Used  . Alcohol Use: Yes     Comment: Occasional     Objective:   Physical Exam Well-developed obese white male in no acute distress. Blood pressure 110/68 pulse 84 height 5 foot 4 weight 202. HEENT; nontraumatic  normocephalic EOMI PERRLA sclera anicteric, Supple no JVD, Cardiovascular; regular rate and rhythm with S1-S2 no murmur or gallop, Pulmonary; clear bilaterally, Abdomen; soft nontender nondistended bowel sounds are active there is no palpable mass or hepatosplenomegaly him Rectal ;exam not done, Extremities; no clubbing cyanosis or edema skin warm and dry, Psych;affect odd  but appropriate       Assessment & Plan:  #25 56 year old male with significant microcytic anemia, probably iron deficient. Etiology is not clear, will need to rule out occult GI malignancy, AVMs, malabsorptive process. #2 low volume hematochezia; likely secondary previously  documented internal and external  hemorrhoids, and unlikely to be the cause of his anemia #3 hypertension #4 prostate cancer-no treatment to date #5 depressive disorder  Plan; check iron studies today Continue fergon 325 mg  twice daily Schedule for colonoscopy and upper endoscopy with possible small bowel biopsies with Dr. Lina Sar, procedures were discussed in detail with the patient and he is agreeable to proceed. Further workup pending findings of above

## 2012-02-17 NOTE — Patient Instructions (Addendum)
You have been scheduled for an endoscopy and colonoscopy with propofol. Please follow the written instructions given to you at your visit today. Please pick up your prep at the pharmacy within the next 1-3 days. If you use inhalers (even only as needed) or a CPAP machine, please bring them with you on the day of your procedure. 

## 2012-02-17 NOTE — Progress Notes (Signed)
Reviewed, will proceed with EGD/colon and depending on the esults, will consider SBCE.

## 2012-02-18 ENCOUNTER — Telehealth: Payer: Self-pay | Admitting: Internal Medicine

## 2012-02-18 NOTE — Telephone Encounter (Signed)
Spoke with patient and reviewed.instructions for procedure. He understands that he must have someone with him the entire procedure.

## 2012-02-19 ENCOUNTER — Other Ambulatory Visit: Payer: Self-pay | Admitting: *Deleted

## 2012-02-19 ENCOUNTER — Telehealth: Payer: Self-pay | Admitting: *Deleted

## 2012-02-19 DIAGNOSIS — D649 Anemia, unspecified: Secondary | ICD-10-CM

## 2012-02-19 NOTE — Telephone Encounter (Signed)
Spoke to the pt on his cell phone.  I explained for him to go to the lab for blood work at 1:00 PM. Then after the lab draw he is to go to the 4th floor to be there for 1:30 PM.  I also went over the prep one more time in detail.  He had more questions.  I think he understood.

## 2012-02-19 NOTE — Telephone Encounter (Signed)
Called home number [phone]. Number has been disconnected. Called mobile number, [phone] , voice mail not set up yet.

## 2012-02-20 ENCOUNTER — Other Ambulatory Visit (INDEPENDENT_AMBULATORY_CARE_PROVIDER_SITE_OTHER): Payer: Medicare Other

## 2012-02-20 ENCOUNTER — Ambulatory Visit (AMBULATORY_SURGERY_CENTER): Payer: Medicare Other | Admitting: Internal Medicine

## 2012-02-20 ENCOUNTER — Encounter: Payer: Self-pay | Admitting: Internal Medicine

## 2012-02-20 VITALS — BP 146/93 | HR 71 | Temp 98.1°F | Resp 15 | Ht 64.0 in | Wt 202.0 lb

## 2012-02-20 DIAGNOSIS — D126 Benign neoplasm of colon, unspecified: Secondary | ICD-10-CM

## 2012-02-20 DIAGNOSIS — I10 Essential (primary) hypertension: Secondary | ICD-10-CM | POA: Diagnosis not present

## 2012-02-20 DIAGNOSIS — D509 Iron deficiency anemia, unspecified: Secondary | ICD-10-CM | POA: Diagnosis not present

## 2012-02-20 DIAGNOSIS — K625 Hemorrhage of anus and rectum: Secondary | ICD-10-CM

## 2012-02-20 DIAGNOSIS — K219 Gastro-esophageal reflux disease without esophagitis: Secondary | ICD-10-CM | POA: Diagnosis not present

## 2012-02-20 DIAGNOSIS — F411 Generalized anxiety disorder: Secondary | ICD-10-CM | POA: Diagnosis not present

## 2012-02-20 DIAGNOSIS — D1330 Benign neoplasm of unspecified part of small intestine: Secondary | ICD-10-CM

## 2012-02-20 DIAGNOSIS — K2100 Gastro-esophageal reflux disease with esophagitis, without bleeding: Secondary | ICD-10-CM

## 2012-02-20 DIAGNOSIS — D649 Anemia, unspecified: Secondary | ICD-10-CM

## 2012-02-20 DIAGNOSIS — K319 Disease of stomach and duodenum, unspecified: Secondary | ICD-10-CM | POA: Diagnosis not present

## 2012-02-20 LAB — FERRITIN: Ferritin: 55.5 ng/mL (ref 22.0–322.0)

## 2012-02-20 MED ORDER — SODIUM CHLORIDE 0.9 % IV SOLN: 500.0000 mL | INTRAVENOUS | Status: DC

## 2012-02-20 NOTE — Progress Notes (Signed)
1536 pleased report to [facility]

## 2012-02-20 NOTE — Op Note (Signed)
[facility] [address] [place], [address]   ENDOSCOPY PROCEDURE REPORT  PATIENT: [name], [name]  MR#: [id number] BIRTHDATE: 18-Jun-1955 , 56  yrs. old GENDER: Male ENDOSCOPIST: Hart Carwin, MD REFERRED BY:  Kellie Shropshire, M.D. PROCEDURE DATE:  02/20/2012 PROCEDURE:  EGD w/ biopsy ASA CLASS:     Class II INDICATIONS:  Iron deficiency anemia.   Epigastric pain. MEDICATIONS: MAC sedation, administered by CRNA and Propofol (Diprivan) 120 mg IV TOPICAL ANESTHETIC: none  DESCRIPTION OF PROCEDURE: After the risks benefits and alternatives of the procedure were thoroughly explained, informed consent was obtained.  The LB GIF-H180 [id number] endoscope was introduced through the mouth and advanced to the second portion of the duodenum. Without limitations.  The instrument was slowly withdrawn as the mucosa was fully examined.      there were several linear erosions at the GE junction and slight increase in the fibrous tissue. There was no definite esophageal stricture. biopsies were taken from esophageal erosions .Endo scope passed into the stomach without difficulty. There was marked antral gastritis with the mucosal hemorrhages and coffee ground specks covering the mucosa . Pyloric antrum  was normal . Biopsies  were taken from gastric antrum to rule out H. pyloric. Duodenal biopsies were taken to rule out villous atrophy      Retroflexed views revealed no abnormalities.     The scope was then withdrawn from the patient and the procedure completed.  COMPLICATIONS: There were no complications. ENDOSCOPIC IMPRESSION: grade 1 esophagitis Hemorrhagic gastritis possible source of bleeding status post biopsies Normal duodenum status post biopsies to rule out villous atrophy RECOMMENDATIONS: Await pathology results colonoscopy PPI Iron supplement avoid NSAID's  REPEAT EXAM: no recall  eSigned:  Hart Carwin, MD 02/20/2012 3:40 PM   CC:  PATIENT NAME:   Jiovanny, Burdell MR#: [id number]

## 2012-02-20 NOTE — Progress Notes (Signed)
No complaints noted in the recovery room. Maw  Patient did not experience any of the following events: a burn prior to discharge; a fall within the facility; wrong site/side/patient/procedure/implant event; or a hospital transfer or hospital admission upon discharge from the facility. (G8907) Patient did not have preoperative order for IV antibiotic SSI prophylaxis. (G8918)  

## 2012-02-20 NOTE — Patient Instructions (Addendum)
Handouts were given to your care partner on gastritis, esophagitis and polyps.  Per Dr. Juanda Chance continue your acid reducing medication and iron supplement.  Avoid NSAID, aspirin, aspirin products or any anti-inflammatroy medication.  You may resume your other current medication today.  The thrid floor nurse for Dr. Juanda Chance will call you to set up an appointment for a small bowel capsule endoscopy.  Please call if any questions or concerns.    YOU HAD AN ENDOSCOPIC PROCEDURE TODAY AT THE [facility]: Refer to the procedure report that was given to you for any specific questions about what was found during the examination.  If the procedure report does not answer your questions, please call your gastroenterologist to clarify.  If you requested that your care partner not be given the details of your procedure findings, then the procedure report has been included in a sealed envelope for you to review at your convenience later.  YOU SHOULD EXPECT: Some feelings of bloating in the abdomen. Passage of more gas than usual.  Walking can help get rid of the air that was put into your GI tract during the procedure and reduce the bloating. If you had a lower endoscopy (such as a colonoscopy or flexible sigmoidoscopy) you may notice spotting of blood in your stool or on the toilet paper. If you underwent a bowel prep for your procedure, then you may not have a normal bowel movement for a few days.  DIET: Your first meal following the procedure should be a light meal and then it is ok to progress to your normal diet.  A half-sandwich or bowl of soup is an example of a good first meal.  Heavy or fried foods are harder to digest and may make you feel nauseous or bloated.  Likewise meals heavy in dairy and vegetables can cause extra gas to form and this can also increase the bloating.  Drink plenty of fluids but you should avoid alcoholic beverages for 24 hours.  ACTIVITY: Your care partner should take you home  directly after the procedure.  You should plan to take it easy, moving slowly for the rest of the day.  You can resume normal activity the day after the procedure however you should NOT DRIVE or use heavy machinery for 24 hours (because of the sedation medicines used during the test).    SYMPTOMS TO REPORT IMMEDIATELY: A gastroenterologist can be reached at any hour.  During normal business hours, 8:30 AM to 5:00 PM Monday through Friday, call [phone].  After hours and on weekends, please call the GI answering service at [phone] who will take a message and have the physician on call contact you.   Following lower endoscopy (colonoscopy or flexible sigmoidoscopy):  Excessive amounts of blood in the stool  Significant tenderness or worsening of abdominal pains  Swelling of the abdomen that is new, acute  Fever of 100F or higher  Following upper endoscopy (EGD)  Vomiting of blood or coffee ground material  New chest pain or pain under the shoulder blades  Painful or persistently difficult swallowing  New shortness of breath  Fever of 100F or higher  Black, tarry-looking stools  FOLLOW UP: If any biopsies were taken you will be contacted by phone or by letter within the next 1-3 weeks.  Call your gastroenterologist if you have not heard about the biopsies in 3 weeks.  Our staff will call the home number listed on your records the next business day following your  procedure to check on you and address any questions or concerns that you may have at that time regarding the information given to you following your procedure. This is a courtesy call and so if there is no answer at the home number and we have not heard from you through the emergency physician on call, we will assume that you have returned to your regular daily activities without incident.  SIGNATURES/CONFIDENTIALITY: You and/or your care partner have signed paperwork which will be entered into your electronic medical  record.  These signatures attest to the fact that that the information above on your After Visit Summary has been reviewed and is understood.  Full responsibility of the confidentiality of this discharge information lies with you and/or your care-partner.

## 2012-02-20 NOTE — Op Note (Signed)
[facility] [address] [place], [address]   COLONOSCOPY PROCEDURE REPORT  PATIENT: [name], [name]  MR#: [id number] BIRTHDATE: 24-Jan-1956 , 56  yrs. old GENDER: Male ENDOSCOPIST: Hart Carwin, MD REFERRED BY:  Kellie Shropshire, M.D. PROCEDURE DATE:  02/20/2012 PROCEDURE:   Colonoscopy with snare polypectomy ASA CLASS:   Class II INDICATIONS:Iron Deficiency Anemia and last colon 2002- hemorrhoids.  MEDICATIONS: MAC sedation, administered by CRNA, Propofol (Diprivan), and Propofol (Diprivan) 220 mg IV  DESCRIPTION OF PROCEDURE:   After the risks and benefits and of the procedure were explained, informed consent was obtained.  A digital rectal exam revealed no abnormalities of the rectum.    The LB CF-H180AL [id number]  endoscope was introduced through the anus and advanced to the cecum, which was identified by both the appendix and ileocecal valve .  The quality of the prep was good, using MoviPrep .  The instrument was then slowly withdrawn as the colon was fully examined.     COLON FINDINGS: A smooth sessile polyp ranging between 5-27mm in size was found in the descending colon.  A polypectomy was performed with a cold snare.  The resection was complete and the polyp tissue was completely retrieved.     Retroflexed views revealed no abnormalities.     The scope was then withdrawn from the patient and the procedure completed.  COMPLICATIONS: There were no complications. ENDOSCOPIC IMPRESSION: Sessile polyp ranging between 5-26mm in size was found in the descending colon; polypectomy was performed with a cold snare  RECOMMENDATIONS: Await pathology results iron supplements SBCE to r/o small bowl source of bleeding   REPEAT EXAM: In 5 year(s)  for Colonoscopy.  cc:  _______________________________ eSignedHart Carwin, MD 02/20/2012 3:45 PM

## 2012-02-21 ENCOUNTER — Telehealth: Payer: Self-pay | Admitting: *Deleted

## 2012-02-21 NOTE — Telephone Encounter (Signed)
Unable to reach patient to schedule SBCE. Will try again later.

## 2012-02-21 NOTE — Telephone Encounter (Signed)
  Follow up Call-  Call back number 02/20/2012  Post procedure Call Back phone  # [phone]  Permission to leave phone message Yes     Patient questions:  Do you have a fever, pain , or abdominal swelling? no Pain Score  0 *  Have you tolerated food without any problems? yes  Have you been able to return to your normal activities? yes  Do you have any questions about your discharge instructions: Diet   no Medications  no Follow up visit  no  Do you have questions or concerns about your Care? no  Actions: * If pain score is 4 or above: No action needed, pain <4.

## 2012-02-24 NOTE — Telephone Encounter (Signed)
Home number disconnected. Unable to reach patient at cell number.

## 2012-02-25 ENCOUNTER — Encounter: Payer: Self-pay | Admitting: Internal Medicine

## 2012-02-25 NOTE — Telephone Encounter (Signed)
Spoke with patient and scheduled for nurse visit on 02/28/12 at 2:00 PM.

## 2012-02-28 ENCOUNTER — Telehealth: Payer: Self-pay | Admitting: *Deleted

## 2012-02-28 ENCOUNTER — Other Ambulatory Visit: Payer: Self-pay | Admitting: *Deleted

## 2012-02-28 DIAGNOSIS — D649 Anemia, unspecified: Secondary | ICD-10-CM

## 2012-02-28 NOTE — Telephone Encounter (Signed)
Patient arrived for capsule endoscopy teaching. Patient given verbal and written instructions. Patient verbalizes understanding of procedure and pre for procedure. Handouts given. Patient scheduled for procedure on 03/09/12 at 8:30 AM.

## 2012-03-04 ENCOUNTER — Telehealth: Payer: Self-pay | Admitting: Internal Medicine

## 2012-03-04 NOTE — Telephone Encounter (Signed)
Patient wants to reschedule after Christmas. Rescheduled to 03/16/12.

## 2012-03-13 ENCOUNTER — Encounter (HOSPITAL_COMMUNITY): Payer: Self-pay | Admitting: Emergency Medicine

## 2012-03-13 ENCOUNTER — Emergency Department (HOSPITAL_COMMUNITY)
Admission: EM | Admit: 2012-03-13 | Discharge: 2012-03-13 | Disposition: A | Discharge status: Home / Self Care | Payer: Medicare Other | Attending: Emergency Medicine | Admitting: Emergency Medicine

## 2012-03-13 DIAGNOSIS — C61 Malignant neoplasm of prostate: Secondary | ICD-10-CM | POA: Diagnosis not present

## 2012-03-13 DIAGNOSIS — K219 Gastro-esophageal reflux disease without esophagitis: Secondary | ICD-10-CM | POA: Insufficient documentation

## 2012-03-13 DIAGNOSIS — H3530 Unspecified macular degeneration: Secondary | ICD-10-CM | POA: Insufficient documentation

## 2012-03-13 DIAGNOSIS — Z8659 Personal history of other mental and behavioral disorders: Secondary | ICD-10-CM | POA: Diagnosis not present

## 2012-03-13 DIAGNOSIS — S00451A Superficial foreign body of right ear, initial encounter: Secondary | ICD-10-CM

## 2012-03-13 DIAGNOSIS — Z79899 Other long term (current) drug therapy: Secondary | ICD-10-CM | POA: Insufficient documentation

## 2012-03-13 DIAGNOSIS — Z9889 Other specified postprocedural states: Secondary | ICD-10-CM | POA: Diagnosis not present

## 2012-03-13 DIAGNOSIS — T169XXA Foreign body in ear, unspecified ear, initial encounter: Secondary | ICD-10-CM | POA: Insufficient documentation

## 2012-03-13 DIAGNOSIS — I10 Essential (primary) hypertension: Secondary | ICD-10-CM | POA: Insufficient documentation

## 2012-03-13 DIAGNOSIS — Y93E8 Activity, other personal hygiene: Secondary | ICD-10-CM | POA: Insufficient documentation

## 2012-03-13 DIAGNOSIS — D649 Anemia, unspecified: Secondary | ICD-10-CM | POA: Insufficient documentation

## 2012-03-13 DIAGNOSIS — IMO0002 Reserved for concepts with insufficient information to code with codable children: Secondary | ICD-10-CM | POA: Insufficient documentation

## 2012-03-13 DIAGNOSIS — Y929 Unspecified place or not applicable: Secondary | ICD-10-CM | POA: Insufficient documentation

## 2012-03-13 MED ORDER — CIPROFLOXACIN-DEXAMETHASONE 0.3-0.1 % OT SUSP
4.0000 [drp] | Freq: Once | OTIC | Status: AC
  Administered 2012-03-13: 4 [drp] via OTIC
  Filled 2012-03-13: qty 7.5

## 2012-03-13 NOTE — ED Provider Notes (Signed)
History     CSN: [id number]  Arrival date & time 03/13/12  4098   First MD Initiated Contact with Patient 03/13/12 [phone]      No chief complaint on file.   (Consider location/radiation/quality/duration/timing/severity/associated sxs/prior treatment) HPI  56 year old male presents for evaluation of R ear discomfort.  Pt reports he was using Q-tip to clean his R ear and noticed that the tip broke off in ear.  This happened 3 days ago.  Denies no significant pain, hearing changes, discharge or rash.  No fever, headache, jaw pain, or trouble swallowing.  Does c/o mild irritation to R ear only.    Past Medical History  Diagnosis Date  . Anemia   . Prostate cancer 08/2009  . GERD (gastroesophageal reflux disease)   . Macular degeneration   . Hypertension   . Depression     Past Surgical History  Procedure Date  . Prostate biopsy     x 2  . Left ankle joint fusion 1981  . Inguinal hernia repair     Family History  Problem Relation Age of Onset  . Lung cancer Mother     Deceased  . Throat cancer Brother   . Pancreatic cancer Father   . Heart disease Father     Deceased  . Lung cancer Maternal Uncle     nephew  . Diabetes Other     History  Substance Use Topics  . Smoking status: Never Smoker   . Smokeless tobacco: Never Used  . Alcohol Use: Yes     Comment: Occasional      Review of Systems  Constitutional: Negative for fever.  HENT: Negative for hearing loss and ear pain.   Skin: Negative for rash.  Neurological: Negative for numbness.    Allergies  Review of patient's allergies indicates no known allergies.  Home Medications   Current Outpatient Rx  Name  Route  Sig  Dispense  Refill  . ALPRAZOLAM 1 MG PO TABS   Oral   Take 1 mg by mouth at bedtime as needed.          Marland Kitchen BENICAR HCT 40-25 MG PO TABS   Oral   Take 1 tablet by mouth every morning.          Marland Kitchen DIVALPROEX SODIUM 500 MG PO TBEC   Oral   Take 500 mg by mouth 3 (three) times  daily. Take 1 tab         . FAMOTIDINE 20 MG PO TABS   Oral   Take 20 mg by mouth 2 (two) times daily. Take 1 tab as needed.         Marland Kitchen FERROUS GLUCONATE 325 MG PO TABS   Oral   Take 325 mg by mouth 2 (two) times daily.         Marland Kitchen HYDROCODONE-ACETAMINOPHEN 5-500 MG PO TABS   Oral   Take 1 tablet by mouth as needed.          Marland Kitchen METOPROLOL TARTRATE 50 MG PO TABS   Oral   Take 50 mg by mouth daily.           BP 131/84  Pulse 61  Temp 98 F (36.7 C) (Oral)  Resp 20  SpO2 100%  Physical Exam  Nursing note and vitals reviewed. Constitutional: He is oriented to person, place, and time. He appears well-developed and well-nourished.  HENT:  Head: Normocephalic and atraumatic.  Left Ear: External ear normal.  Nose: Nose normal.  Mouth/Throat: Oropharynx is clear and moist. No oropharyngeal exudate.       R ear: cotton ball of Q-tip noted in ear canal. TM obscured by fb.  No tenderness to ear lobe.  No discharge.    Eyes: Conjunctivae normal are normal.  Neck: Neck supple.  Neurological: He is alert and oriented to person, place, and time.  Skin: No rash noted.  Psychiatric: He has a normal mood and affect.    ED Course  FOREIGN BODY REMOVAL Date/Time: 03/13/2012 10:21 AM Performed by: Fayrene Helper Authorized by: Fayrene Helper Consent: Verbal consent obtained. Risks and benefits: risks, benefits and alternatives were discussed Consent given by: patient Patient understanding: patient states understanding of the procedure being performed Patient consent: the patient's understanding of the procedure matches consent given Patient identity confirmed: verbally with patient Body area: ear Location details: right ear Localization method: ENT speculum Removal mechanism: forceps Complexity: simple 1 objects recovered. Objects recovered: cotton tip (Q-tip) Post-procedure assessment: foreign body removed Patient tolerance: Patient tolerated the procedure well with no  immediate complications.   (including critical care time)  Labs Reviewed - No data to display No results found.   No diagnosis found.  1. fb in R ear  MDM  Pt has cotton ball from Q-tip in his R ear canal.  I was able to remove it succesfully using forcep.  Pt tolerates procedure well. TM visualized and intact.  Mild irritation noted to ear canal without edema.  ciprodex ordered.  Recommend refrain from using Qtip to clean ear.    BP 131/84  Pulse 61  Temp 98 F (36.7 C) (Oral)  Resp 20  SpO2 100%  I have reviewed nursing notes and vital signs.   I reviewed available ER/hospitalization records thought the EMR         Fayrene Helper, [place] 03/13/12 1024

## 2012-03-13 NOTE — ED Notes (Signed)
Since this am Pt has part of Q-Tip stuck in right ear. No Pain. Pt describes as "irritating". Pt also dx with prostate cancer 1.5 years ago.

## 2012-03-14 NOTE — ED Provider Notes (Signed)
Medical screening examination/treatment/procedure(s) were performed by non-physician practitioner and as supervising physician I was immediately available for consultation/collaboration.   Micheale Schlack E Jakalyn Kratky, MD 03/14/12 0733 

## 2012-03-16 ENCOUNTER — Ambulatory Visit (INDEPENDENT_AMBULATORY_CARE_PROVIDER_SITE_OTHER): Payer: Medicare Other | Admitting: Gastroenterology

## 2012-03-16 DIAGNOSIS — D509 Iron deficiency anemia, unspecified: Secondary | ICD-10-CM

## 2012-03-17 NOTE — Progress Notes (Signed)
Patient here for capsule endoscopy.  Lot # [id number] exp 03/2013.  Capsule ID [id number].

## 2012-03-31 ENCOUNTER — Telehealth: Payer: Self-pay | Admitting: Internal Medicine

## 2012-03-31 DIAGNOSIS — D649 Anemia, unspecified: Secondary | ICD-10-CM

## 2012-03-31 NOTE — Telephone Encounter (Signed)
Patient was instructed to take Miralax 105 grams(7 capfuls) in 32 oz. Of gator aid at 6 PM night before procedure. He states he took 7 tablespoons of Miralax in 32 oz. Gator aid. He is asking if there is anything he can do besides the SBCE again. Please, advise.

## 2012-03-31 NOTE — Telephone Encounter (Signed)
His SBCE was inadequate due to poor prep. To much bowl content in distal small bowl. Only proxima SB visualized. Do you know what prep they have to take? He will have to double prep unless he admits that he did not take the prep.like he was supposed to.

## 2012-03-31 NOTE — Telephone Encounter (Signed)
I think it would be acceptable for [place] to wait with the SBCE and follow his CHC and iron level monthly x 6 months. If he maintains his blood count, we may not have to do SBCE.Marland Kitchen Please check CBC, Fe, TIBC 4 weeks and monthly x 6 months. OV 6 months.

## 2012-03-31 NOTE — Telephone Encounter (Signed)
Patient is asking about his capsule endo results. Please, advise.

## 2012-04-01 NOTE — Telephone Encounter (Signed)
Labs in EPIC. Patient notified of Dr. Regino Schultze recommendations. Patient states that after he thought about it he thinks he would rather do the SBCE again just to get it over with. Please, advise.

## 2012-04-01 NOTE — Telephone Encounter (Signed)
OK, so reschedule the SBCE with complete prep

## 2012-04-02 DIAGNOSIS — C61 Malignant neoplasm of prostate: Secondary | ICD-10-CM | POA: Diagnosis not present

## 2012-04-02 NOTE — Telephone Encounter (Signed)
Scheduled patient for SBCE teaching on 04/06/12 and procedure on 04/13/12.

## 2012-04-03 DIAGNOSIS — C61 Malignant neoplasm of prostate: Secondary | ICD-10-CM | POA: Diagnosis not present

## 2012-04-03 DIAGNOSIS — I10 Essential (primary) hypertension: Secondary | ICD-10-CM | POA: Diagnosis not present

## 2012-04-03 DIAGNOSIS — F411 Generalized anxiety disorder: Secondary | ICD-10-CM | POA: Diagnosis not present

## 2012-04-03 DIAGNOSIS — F329 Major depressive disorder, single episode, unspecified: Secondary | ICD-10-CM | POA: Diagnosis not present

## 2012-04-03 DIAGNOSIS — Z09 Encounter for follow-up examination after completed treatment for conditions other than malignant neoplasm: Secondary | ICD-10-CM | POA: Diagnosis not present

## 2012-04-03 DIAGNOSIS — Z808 Family history of malignant neoplasm of other organs or systems: Secondary | ICD-10-CM | POA: Diagnosis not present

## 2012-04-06 ENCOUNTER — Telehealth: Payer: Self-pay | Admitting: Internal Medicine

## 2012-04-06 NOTE — Telephone Encounter (Signed)
Spoke with patient and rescheduled him to 04/07/12 for capsule teaching.

## 2012-04-07 ENCOUNTER — Ambulatory Visit (INDEPENDENT_AMBULATORY_CARE_PROVIDER_SITE_OTHER): Payer: Medicare Other | Admitting: Physician Assistant

## 2012-04-07 DIAGNOSIS — D500 Iron deficiency anemia secondary to blood loss (chronic): Secondary | ICD-10-CM

## 2012-04-08 NOTE — Progress Notes (Signed)
Late entry   04/07/12  Pt here for teaching for the Capsule Endo. Pt had one schedule previously, but did not do the correct prep. Today, he was given the correct prep and provided 7 packs of Miralax, plus an extra one per Dr Juanda Chance, for him to use. Discussed prep instructions again, several times and pt stated understanding with mixing the solution. We discussed the diet and prep instructions several times and each time pt interrupted me with discussions of cancer. Each time I tried to reassure pt we do not know if he has cancer, his other procedures did not reveal any cause of bleeding. After 30 minutes of discussion, pt was able to agree and tell me of the prep instructions and he was instructed to call us for any questions.

## 2012-04-09 DIAGNOSIS — F411 Generalized anxiety disorder: Secondary | ICD-10-CM | POA: Diagnosis not present

## 2012-04-09 DIAGNOSIS — M545 Low back pain, unspecified: Secondary | ICD-10-CM | POA: Diagnosis not present

## 2012-04-09 DIAGNOSIS — Z79899 Other long term (current) drug therapy: Secondary | ICD-10-CM | POA: Diagnosis not present

## 2012-04-09 DIAGNOSIS — E559 Vitamin D deficiency, unspecified: Secondary | ICD-10-CM | POA: Diagnosis not present

## 2012-04-09 DIAGNOSIS — Z0000 Encounter for general adult medical examination without abnormal findings: Secondary | ICD-10-CM | POA: Diagnosis not present

## 2012-04-09 DIAGNOSIS — I10 Essential (primary) hypertension: Secondary | ICD-10-CM | POA: Diagnosis not present

## 2012-04-13 ENCOUNTER — Ambulatory Visit (INDEPENDENT_AMBULATORY_CARE_PROVIDER_SITE_OTHER): Payer: Medicare Other | Admitting: Gastroenterology

## 2012-04-13 DIAGNOSIS — D500 Iron deficiency anemia secondary to blood loss (chronic): Secondary | ICD-10-CM | POA: Diagnosis not present

## 2012-04-13 NOTE — Progress Notes (Signed)
Pt in this am and stated he did the prep correct this time. We had provided him with 8 packs of Miralax to be mixed in 32 oz of Gatorade. He states he had BM's and thinks everything worked this time. He denies eating or drinking anything since Midnight last night. Pt swalowed the Capsule, LOT [id number], w/o difficulty and lay on his side while bing monitored for 30 minutes. He was given discharge instructions with diet and he stated understanding. Pt arrived back 8 hours later and stated he had no problems. He was discharged home and computer module was connected to the computer.

## 2012-04-20 ENCOUNTER — Encounter: Payer: Self-pay | Admitting: Internal Medicine

## 2012-04-22 DIAGNOSIS — R972 Elevated prostate specific antigen [PSA]: Secondary | ICD-10-CM | POA: Diagnosis not present

## 2012-04-22 DIAGNOSIS — C61 Malignant neoplasm of prostate: Secondary | ICD-10-CM | POA: Diagnosis not present

## 2012-05-04 ENCOUNTER — Telehealth: Payer: Self-pay | Admitting: *Deleted

## 2012-05-04 DIAGNOSIS — D649 Anemia, unspecified: Secondary | ICD-10-CM

## 2012-05-04 NOTE — Telephone Encounter (Signed)
Message copied by Daphine Deutscher on Mon May 04, 2012  9:35 AM ------      Message from: Hart Carwin      Created: Thu Apr 30, 2012  3:51 PM      Regarding: SBCE       Hello Rene Kocher. Could you, please, call Mr Grunder, I could not get hold of him .His SBCE on 1/27 showed  Gastritis and duodenitis- the same finding as on EGD, He ought to be taking his Pepcid and Iron. He needs to have a CBC , the one on 04/01/2012 was canceled?, may be he had one at his PCP, but he needs a follow up CBC to make sure his blood count has corrected. ------

## 2012-05-04 NOTE — Telephone Encounter (Signed)
Patient given results and recommendations. He will continue medications and will come for labs next.

## 2012-05-07 DIAGNOSIS — L608 Other nail disorders: Secondary | ICD-10-CM | POA: Diagnosis not present

## 2012-05-07 DIAGNOSIS — L84 Corns and callosities: Secondary | ICD-10-CM | POA: Diagnosis not present

## 2012-05-07 DIAGNOSIS — I739 Peripheral vascular disease, unspecified: Secondary | ICD-10-CM | POA: Diagnosis not present

## 2012-05-12 DIAGNOSIS — D485 Neoplasm of uncertain behavior of skin: Secondary | ICD-10-CM | POA: Diagnosis not present

## 2012-05-12 DIAGNOSIS — D1801 Hemangioma of skin and subcutaneous tissue: Secondary | ICD-10-CM | POA: Diagnosis not present

## 2012-05-12 DIAGNOSIS — L570 Actinic keratosis: Secondary | ICD-10-CM | POA: Diagnosis not present

## 2012-05-14 ENCOUNTER — Telehealth: Payer: Self-pay | Admitting: *Deleted

## 2012-05-14 NOTE — Telephone Encounter (Signed)
Spoke with patient and he will come for labs. He has had a foot problem and tooth problem but he will come soon.

## 2012-05-14 NOTE — Telephone Encounter (Signed)
Message copied by Daphine Deutscher on Thu May 14, 2012 10:27 AM ------      Message from: Daphine Deutscher      Created: Mon May 04, 2012  9:40 AM       Did patient have CBC for DB ------

## 2012-05-21 ENCOUNTER — Encounter: Payer: Self-pay | Admitting: *Deleted

## 2012-05-21 ENCOUNTER — Telehealth: Payer: Self-pay | Admitting: *Deleted

## 2012-05-21 NOTE — Telephone Encounter (Signed)
Patient has not had repeat CBC requested by Dr. Juanda Chance. Patient has been called twice. Letter mailed to patient requesting he come for labs.

## 2012-06-10 ENCOUNTER — Other Ambulatory Visit: Payer: Self-pay | Admitting: *Deleted

## 2012-06-10 ENCOUNTER — Other Ambulatory Visit (INDEPENDENT_AMBULATORY_CARE_PROVIDER_SITE_OTHER): Payer: Medicare Other

## 2012-06-10 DIAGNOSIS — D649 Anemia, unspecified: Secondary | ICD-10-CM | POA: Diagnosis not present

## 2012-06-10 LAB — CBC WITH DIFFERENTIAL/PLATELET
Basophils Relative: 0.6 % (ref 0.0–3.0)
Eosinophils Absolute: 0.2 10*3/uL (ref 0.0–0.7)
Eosinophils Relative: 2.5 % (ref 0.0–5.0)
Hemoglobin: 15.5 g/dL (ref 13.0–17.0)
Lymphocytes Relative: 15.2 % (ref 12.0–46.0)
MCHC: 34 g/dL (ref 30.0–36.0)
Monocytes Relative: 8.3 % (ref 3.0–12.0)
Neutro Abs: 5.4 10*3/uL (ref 1.4–7.7)
Neutrophils Relative %: 73.4 % (ref 43.0–77.0)
RBC: 5.34 Mil/uL (ref 4.22–5.81)
WBC: 7.4 10*3/uL (ref 4.5–10.5)

## 2012-06-17 DIAGNOSIS — I10 Essential (primary) hypertension: Secondary | ICD-10-CM | POA: Diagnosis not present

## 2012-06-17 DIAGNOSIS — F411 Generalized anxiety disorder: Secondary | ICD-10-CM | POA: Diagnosis not present

## 2012-06-17 DIAGNOSIS — Z808 Family history of malignant neoplasm of other organs or systems: Secondary | ICD-10-CM | POA: Diagnosis not present

## 2012-06-17 DIAGNOSIS — Z09 Encounter for follow-up examination after completed treatment for conditions other than malignant neoplasm: Secondary | ICD-10-CM | POA: Diagnosis not present

## 2012-06-17 DIAGNOSIS — C61 Malignant neoplasm of prostate: Secondary | ICD-10-CM | POA: Diagnosis not present

## 2012-06-17 DIAGNOSIS — F329 Major depressive disorder, single episode, unspecified: Secondary | ICD-10-CM | POA: Diagnosis not present

## 2012-08-17 DIAGNOSIS — L84 Corns and callosities: Secondary | ICD-10-CM | POA: Diagnosis not present

## 2012-08-17 DIAGNOSIS — L608 Other nail disorders: Secondary | ICD-10-CM | POA: Diagnosis not present

## 2012-08-17 DIAGNOSIS — I739 Peripheral vascular disease, unspecified: Secondary | ICD-10-CM | POA: Diagnosis not present

## 2012-08-19 DIAGNOSIS — N401 Enlarged prostate with lower urinary tract symptoms: Secondary | ICD-10-CM | POA: Diagnosis not present

## 2012-08-19 DIAGNOSIS — C61 Malignant neoplasm of prostate: Secondary | ICD-10-CM | POA: Diagnosis not present

## 2012-09-10 ENCOUNTER — Encounter: Payer: Self-pay | Admitting: *Deleted

## 2012-09-10 ENCOUNTER — Telehealth: Payer: Self-pay | Admitting: *Deleted

## 2012-09-10 NOTE — Telephone Encounter (Signed)
Unable to reach patient at either number in chart. Letter mailed to patient.

## 2012-09-10 NOTE — Telephone Encounter (Signed)
Unable to reach patient. Home number disconnected. Cell number busy.

## 2012-09-10 NOTE — Telephone Encounter (Signed)
Message copied by Daphine Deutscher on Thu Sep 10, 2012  9:28 AM ------      Message from: Daphine Deutscher      Created: Wed Jun 10, 2012  4:00 PM       Call and remind patient due for CBC, Iron panel on 09/14/12 DB ------

## 2012-09-14 DIAGNOSIS — L253 Unspecified contact dermatitis due to other chemical products: Secondary | ICD-10-CM | POA: Diagnosis not present

## 2012-09-14 DIAGNOSIS — L819 Disorder of pigmentation, unspecified: Secondary | ICD-10-CM | POA: Diagnosis not present

## 2012-09-14 DIAGNOSIS — D239 Other benign neoplasm of skin, unspecified: Secondary | ICD-10-CM | POA: Diagnosis not present

## 2012-09-14 DIAGNOSIS — L570 Actinic keratosis: Secondary | ICD-10-CM | POA: Diagnosis not present

## 2012-09-22 ENCOUNTER — Telehealth: Payer: Self-pay | Admitting: *Deleted

## 2012-09-22 NOTE — Telephone Encounter (Signed)
Message copied by Daphine Deutscher on Tue Sep 22, 2012  9:07 AM ------      Message from: Daphine Deutscher      Created: Thu Sep 10, 2012  2:11 PM       Did patient have labs for DB. Letter mailed ------

## 2012-09-22 NOTE — Telephone Encounter (Signed)
Spoke with patient and he states he got our letter and he will come for lab work.

## 2012-10-02 ENCOUNTER — Telehealth: Payer: Self-pay | Admitting: *Deleted

## 2012-10-02 NOTE — Telephone Encounter (Signed)
Patient is due for repeat labs. Spoke with patient and mailed him a letter requesting he come for labs. Patient has not had labs done.

## 2012-10-08 DIAGNOSIS — L570 Actinic keratosis: Secondary | ICD-10-CM | POA: Diagnosis not present

## 2012-10-08 DIAGNOSIS — D485 Neoplasm of uncertain behavior of skin: Secondary | ICD-10-CM | POA: Diagnosis not present

## 2012-10-22 ENCOUNTER — Telehealth: Payer: Self-pay | Admitting: Internal Medicine

## 2012-10-22 ENCOUNTER — Other Ambulatory Visit (INDEPENDENT_AMBULATORY_CARE_PROVIDER_SITE_OTHER): Payer: Medicare Other

## 2012-10-22 DIAGNOSIS — D649 Anemia, unspecified: Secondary | ICD-10-CM

## 2012-10-22 LAB — CBC WITH DIFFERENTIAL/PLATELET
Basophils Relative: 0.9 % (ref 0.0–3.0)
Eosinophils Absolute: 0.2 10*3/uL (ref 0.0–0.7)
HCT: 42.9 % (ref 39.0–52.0)
Hemoglobin: 13.3 g/dL (ref 13.0–17.0)
Lymphocytes Relative: 17.6 % (ref 12.0–46.0)
Lymphs Abs: 1.3 10*3/uL (ref 0.7–4.0)
MCHC: 31.1 g/dL (ref 30.0–36.0)
Monocytes Relative: 7 % (ref 3.0–12.0)
Neutro Abs: 5.4 10*3/uL (ref 1.4–7.7)
RBC: 5.88 Mil/uL — ABNORMAL HIGH (ref 4.22–5.81)
RDW: 16 % — ABNORMAL HIGH (ref 11.5–14.6)

## 2012-10-22 LAB — IBC PANEL: Saturation Ratios: 6 % — ABNORMAL LOW (ref 20.0–50.0)

## 2012-10-23 ENCOUNTER — Other Ambulatory Visit: Payer: Self-pay | Admitting: *Deleted

## 2012-10-23 DIAGNOSIS — D509 Iron deficiency anemia, unspecified: Secondary | ICD-10-CM

## 2012-10-23 MED ORDER — OMEPRAZOLE 40 MG PO CPDR: 40.0000 mg | DELAYED_RELEASE_CAPSULE | Freq: Every day | ORAL | Status: DC

## 2012-10-23 NOTE — Telephone Encounter (Signed)
See results note. 

## 2012-10-29 ENCOUNTER — Encounter: Payer: Self-pay | Admitting: *Deleted

## 2012-10-30 ENCOUNTER — Encounter (HOSPITAL_COMMUNITY): Payer: Self-pay | Admitting: Emergency Medicine

## 2012-10-30 ENCOUNTER — Emergency Department (HOSPITAL_COMMUNITY)
Admission: EM | Admit: 2012-10-30 | Discharge: 2012-10-30 | Disposition: A | Discharge status: Home / Self Care | Payer: Medicare Other | Attending: Emergency Medicine | Admitting: Emergency Medicine

## 2012-10-30 DIAGNOSIS — C61 Malignant neoplasm of prostate: Secondary | ICD-10-CM | POA: Insufficient documentation

## 2012-10-30 DIAGNOSIS — K047 Periapical abscess without sinus: Secondary | ICD-10-CM | POA: Diagnosis not present

## 2012-10-30 DIAGNOSIS — K0381 Cracked tooth: Secondary | ICD-10-CM | POA: Insufficient documentation

## 2012-10-30 DIAGNOSIS — Z79899 Other long term (current) drug therapy: Secondary | ICD-10-CM | POA: Insufficient documentation

## 2012-10-30 DIAGNOSIS — H3530 Unspecified macular degeneration: Secondary | ICD-10-CM | POA: Diagnosis not present

## 2012-10-30 DIAGNOSIS — K029 Dental caries, unspecified: Secondary | ICD-10-CM | POA: Insufficient documentation

## 2012-10-30 DIAGNOSIS — R609 Edema, unspecified: Secondary | ICD-10-CM | POA: Insufficient documentation

## 2012-10-30 DIAGNOSIS — K219 Gastro-esophageal reflux disease without esophagitis: Secondary | ICD-10-CM | POA: Diagnosis not present

## 2012-10-30 MED ORDER — HYDROCODONE-ACETAMINOPHEN 5-325 MG PO TABS: 1.0000 | ORAL_TABLET | ORAL | Status: DC | PRN

## 2012-10-30 MED ORDER — PENICILLIN V POTASSIUM 500 MG PO TABS: 500.0000 mg | ORAL_TABLET | Freq: Four times a day (QID) | ORAL | Status: DC

## 2012-10-30 NOTE — ED Provider Notes (Signed)
CSN: [id number]     Arrival date & time 10/30/12  1213 History     First MD Initiated Contact with Patient 10/30/12 1235     Chief Complaint  Patient presents with  . Dental Pain   (Consider location/radiation/quality/duration/timing/severity/associated sxs/prior Treatment) Patient is a 57 y.o. male presenting with tooth pain. The history is provided by the patient and medical records.  Dental Pain  Patient presents to the ED for right lower dental pain and abscess. Patient states he broke his right lower molar approximately 3 years ago, and has had intermittent pain and abscesses since then. Patient states 2 days ago pain has gotten more intense, worse with chewing on affected side.  Pt states there has been some right sided facial swelling which has improved somewhat. Patient denies any difficulty breathing or swallowing.  Patient does have a dentist he sees regularly, has not contacted them yet.  Has been taking OTC ibuprofen for pain with mild relief.  Denies any numbness or paresthesias of face. No recent fevers, sweats, or chills.  Past Medical History  Diagnosis Date  . Microcytic anemia   . Prostate cancer 08/2009  . GERD (gastroesophageal reflux disease)   . Macular degeneration   . Hypertension   . Depression   . Hemorrhoids   . Tubular adenoma    Past Surgical History  Procedure Laterality Date  . Prostate biopsy      x 2  . Left ankle joint fusion  1981  . Inguinal hernia repair     Family History  Problem Relation Age of Onset  . Lung cancer Mother     Deceased  . Throat cancer Brother   . Pancreatic cancer Father   . Heart disease Father     Deceased  . Lung cancer Maternal Uncle     nephew  . Diabetes Other    History  Substance Use Topics  . Smoking status: Never Smoker   . Smokeless tobacco: Never Used  . Alcohol Use: Yes     Comment: Occasional    Review of Systems  HENT: Positive for dental problem.   All other systems reviewed and are  negative.    Allergies  Review of patient's allergies indicates no known allergies.  Home Medications   Current Outpatient Rx  Name  Route  Sig  Dispense  Refill  . ALPRAZolam (XANAX) 1 MG tablet   Oral   Take 1 mg by mouth at bedtime as needed.          Marland Kitchen BENICAR HCT 40-25 MG per tablet   Oral   Take 1 tablet by mouth every morning.          . divalproex (DEPAKOTE) 500 MG DR tablet   Oral   Take 500 mg by mouth 3 (three) times daily. Take 1 tab         . ferrous gluconate (FERGON) 325 MG tablet   Oral   Take 325 mg by mouth 2 (two) times daily.         Marland Kitchen HYDROcodone-acetaminophen (VICODIN) 5-500 MG per tablet   Oral   Take 1 tablet by mouth as needed.          . metoprolol (LOPRESSOR) 50 MG tablet   Oral   Take 50 mg by mouth daily.         Marland Kitchen omeprazole (PRILOSEC) 40 MG capsule   Oral   Take 1 capsule (40 mg total) by mouth daily.   90 capsule  3    BP 139/79  Pulse 74  Temp(Src) 99.4 F (37.4 C) (Oral)  Resp 18  SpO2 100%  Physical Exam  Nursing note and vitals reviewed. Constitutional: He is oriented to person, place, and time. He appears well-developed and well-nourished. No distress.  HENT:  Head: Normocephalic and atraumatic.  Mouth/Throat: Uvula is midline, oropharynx is clear and moist and mucous membranes are normal. No oral lesions. No trismus in the jaw. Abnormal dentition. Dental abscesses and dental caries present. No oropharyngeal exudate, posterior oropharyngeal edema, posterior oropharyngeal erythema or tonsillar abscesses.    Teeth largely in poor dentition,  Right lower molar broken and flush with gum, surrounding gingiva discolored and swollen consistent with dental abscess, handling secretions appropriately, no trismus  Eyes: Conjunctivae and EOM are normal. Pupils are equal, round, and reactive to light.  Neck: Normal range of motion. Neck supple.  Cardiovascular: Normal rate, regular rhythm and normal heart sounds.     Pulmonary/Chest: Effort normal and breath sounds normal. No respiratory distress. He has no wheezes.  Musculoskeletal: Normal range of motion.  Neurological: He is alert and oriented to person, place, and time.  Skin: Skin is warm and dry. He is not diaphoretic.  Psychiatric: He has a normal mood and affect.    ED Course   Procedures (including critical care time)  Labs Reviewed - No data to display No results found. 1. Abscess, dental     MDM   Right lower molar broken with dental abscess.  Patient without airway compromise, handling secretions appropriately, NAD. Patient has a personal to dentist he will followup with. Rx penicillin and Vicodin. Discussed plan with patient and he agreed. Return precautions advised.  Garlon Hatchet, PA-C 10/30/12 1348

## 2012-10-30 NOTE — ED Notes (Signed)
Per pt broke lower right tooth 3 years ago, increased pain since yesterday, thinks it is abscessed

## 2012-11-01 NOTE — ED Provider Notes (Signed)
Medical screening examination/treatment/procedure(s) were performed by non-physician practitioner and as supervising physician I was immediately available for consultation/collaboration.    Yoona Ishii D Trustin Chapa, MD 11/01/12 0353 

## 2012-11-10 DIAGNOSIS — D485 Neoplasm of uncertain behavior of skin: Secondary | ICD-10-CM | POA: Diagnosis not present

## 2012-11-10 DIAGNOSIS — L570 Actinic keratosis: Secondary | ICD-10-CM | POA: Diagnosis not present

## 2012-11-19 ENCOUNTER — Telehealth: Payer: Self-pay | Admitting: *Deleted

## 2012-11-19 ENCOUNTER — Encounter: Payer: Self-pay | Admitting: *Deleted

## 2012-11-19 NOTE — Telephone Encounter (Signed)
Message copied by Daphine Deutscher on Thu Nov 19, 2012  8:21 AM ------      Message from: Daphine Deutscher      Created: Thu Nov 12, 2012  9:32 AM       Did patient do stool cards for DB ------

## 2012-11-19 NOTE — Telephone Encounter (Signed)
Patient has not completed stool cards. Letter mailed.

## 2012-11-20 ENCOUNTER — Ambulatory Visit: Payer: Medicare Other | Admitting: Internal Medicine

## 2012-12-07 ENCOUNTER — Telehealth: Payer: Self-pay | Admitting: *Deleted

## 2012-12-07 NOTE — Telephone Encounter (Signed)
Message copied by Daphine Deutscher on Mon Dec 07, 2012  8:37 AM ------      Message from: Daphine Deutscher      Created: Thu Nov 19, 2012  8:22 AM       Did patient do stool cards for DB ------

## 2012-12-07 NOTE — Telephone Encounter (Signed)
Patient was given hemoccult cards to complete. Did not receive them back. Patient was mailed a letter to remind him to complete the cards or call for [place] to mail new cards. Patient has not completed or contacted [place].

## 2012-12-15 ENCOUNTER — Telehealth: Payer: Self-pay | Admitting: Internal Medicine

## 2012-12-15 ENCOUNTER — Ambulatory Visit: Payer: Medicare Other | Admitting: Internal Medicine

## 2012-12-15 NOTE — Telephone Encounter (Signed)
Unable to reach patient. Will try again later. 

## 2012-12-16 NOTE — Telephone Encounter (Signed)
Unable to reach patient. Will try again later. 

## 2012-12-17 NOTE — Telephone Encounter (Signed)
Unable to reach patient.

## 2012-12-21 NOTE — Telephone Encounter (Signed)
Unable to reach patient and no way to leave a message.

## 2012-12-28 DIAGNOSIS — Z79899 Other long term (current) drug therapy: Secondary | ICD-10-CM | POA: Diagnosis not present

## 2012-12-28 DIAGNOSIS — I10 Essential (primary) hypertension: Secondary | ICD-10-CM | POA: Diagnosis not present

## 2012-12-28 DIAGNOSIS — F411 Generalized anxiety disorder: Secondary | ICD-10-CM | POA: Diagnosis not present

## 2012-12-28 DIAGNOSIS — M545 Low back pain, unspecified: Secondary | ICD-10-CM | POA: Diagnosis not present

## 2012-12-30 DIAGNOSIS — Z808 Family history of malignant neoplasm of other organs or systems: Secondary | ICD-10-CM | POA: Diagnosis not present

## 2012-12-30 DIAGNOSIS — I10 Essential (primary) hypertension: Secondary | ICD-10-CM | POA: Diagnosis not present

## 2012-12-30 DIAGNOSIS — F329 Major depressive disorder, single episode, unspecified: Secondary | ICD-10-CM | POA: Diagnosis not present

## 2012-12-30 DIAGNOSIS — C61 Malignant neoplasm of prostate: Secondary | ICD-10-CM | POA: Diagnosis not present

## 2013-01-05 ENCOUNTER — Ambulatory Visit (INDEPENDENT_AMBULATORY_CARE_PROVIDER_SITE_OTHER): Payer: Medicare Other | Admitting: Internal Medicine

## 2013-01-05 ENCOUNTER — Encounter: Payer: Self-pay | Admitting: Internal Medicine

## 2013-01-05 ENCOUNTER — Other Ambulatory Visit (INDEPENDENT_AMBULATORY_CARE_PROVIDER_SITE_OTHER): Payer: Medicare Other

## 2013-01-05 VITALS — BP 110/60 | HR 72 | Ht 65.0 in | Wt 208.2 lb

## 2013-01-05 DIAGNOSIS — R195 Other fecal abnormalities: Secondary | ICD-10-CM | POA: Diagnosis not present

## 2013-01-05 DIAGNOSIS — D509 Iron deficiency anemia, unspecified: Secondary | ICD-10-CM

## 2013-01-05 LAB — CBC WITH DIFFERENTIAL/PLATELET
Eosinophils Relative: 4.6 % (ref 0.0–5.0)
HCT: 49 % (ref 39.0–52.0)
Hemoglobin: 16.2 g/dL (ref 13.0–17.0)
Lymphs Abs: 1.4 10*3/uL (ref 0.7–4.0)
MCV: 80.2 fl (ref 78.0–100.0)
Monocytes Absolute: 1.3 10*3/uL — ABNORMAL HIGH (ref 0.1–1.0)
Monocytes Relative: 17 % — ABNORMAL HIGH (ref 3.0–12.0)
Neutro Abs: 4.6 10*3/uL (ref 1.4–7.7)
WBC: 7.7 10*3/uL (ref 4.5–10.5)

## 2013-01-05 MED ORDER — FUSION PLUS PO CAPS: 1.0000 | ORAL_CAPSULE | Freq: Every day | ORAL | Status: DC

## 2013-01-05 MED ORDER — HYDROCORTISONE ACETATE 25 MG RE SUPP: 25.0000 mg | Freq: Every day | RECTAL | Status: DC

## 2013-01-05 NOTE — Progress Notes (Signed)
[name] 1956/01/18 MRN [id number]  History of Present Illness:  This is a 57 year old white male with a history of severe iron deficiency anemia. His last appointment with [place] was in December 2013. He had a complete GI evaluation for GI blood loss in 2002 and again in December 2013 which included an upper endoscopy which showed gastritis, a negative colonoscopy except for hemorrhoids and a negative small bowel capsule endoscopy in January 2014. He was briefly on iron supplements but discontinued it. His last hemoglobin in August 2014 was 13.3, hematocrit 42.9 with an MCV of 77 and an iron saturation of 6%. He since then has restarted iron . He denies any heartburn or abdominal pain but has small volume rectal bleeding which is bright red blood. He usually has blood with bowel movements. He also has some staining on his underpants. He has prostate cancer.    Past Medical History  Diagnosis Date  . Microcytic anemia   . Prostate cancer 08/2009  . GERD (gastroesophageal reflux disease)   . Macular degeneration   . Hypertension   . Depression   . Hemorrhoids   . Tubular adenoma    Past Surgical History  Procedure Laterality Date  . Prostate biopsy      x 2  . Left ankle joint fusion  1981  . Inguinal hernia repair      reports that he has never smoked. He has never used smokeless tobacco. He reports that he drinks alcohol. He reports that he does not use illicit drugs. family history includes Diabetes in his other; Heart disease in his father; Lung cancer in his maternal uncle and mother; Pancreatic cancer in his father; Throat cancer in his brother. No Known Allergies      Review of Systems: Negative for dysphagia abdominal pain weight loss  The remainder of the 10 point ROS is negative except as outlined in H&P   Physical Exam: General appearance  Well developed, in no distress. Eyes- non icteric. HEENT nontraumatic, normocephalic. Mouth no lesions, tongue papillated, no  cheilosis. Neck supple without adenopathy, thyroid not enlarged, no carotid bruits, no JVD. Lungs Clear to auscultation bilaterally. Cor normal S1, normal S2, regular rhythm, no murmur,  quiet precordium. Abdomen: Obese soft nontender with normoactive bowel sounds. No tympany. Rectal: Soft Hemoccult positive stool. Extremities no pedal edema. Skin no lesions. Neurological alert and oriented x 3. Psychological normal mood and affect.  Assessment and Plan:  Problem #13 57 year old white male with a negative GI workup for GI blood loss. He is having low-volume hematochezia likely from hemorrhoids. He will resume his iron supplements and stay on them indefinitely. We will check his iron studies and CBC today. If the iron remains low, he will be scheduled for an iron infusion. I will consider doing a CT scan of the abdomen as part of the evaluation of the iron deficiency anemia before considering repeat endoscopic studies which would be his third GI work up.   01/05/2013 Lina Sar

## 2013-01-05 NOTE — Patient Instructions (Signed)
We have sent the following medications to your pharmacy for you to pick up at your convenience: Anusol HC -Insert 1 suppository into rectum every night x 12 nights. Fusion Plus-Take 1 capsule by mouth once daily (this is an iron supplement)  Your physician has requested that you go to the basement for the following lab work before leaving today: CBC, IBC  CC: Dr Renae Gloss

## 2013-01-08 DIAGNOSIS — L538 Other specified erythematous conditions: Secondary | ICD-10-CM | POA: Diagnosis not present

## 2013-01-08 DIAGNOSIS — L259 Unspecified contact dermatitis, unspecified cause: Secondary | ICD-10-CM | POA: Diagnosis not present

## 2013-01-08 DIAGNOSIS — L570 Actinic keratosis: Secondary | ICD-10-CM | POA: Diagnosis not present

## 2013-01-12 DIAGNOSIS — L988 Other specified disorders of the skin and subcutaneous tissue: Secondary | ICD-10-CM | POA: Diagnosis not present

## 2013-01-12 DIAGNOSIS — B351 Tinea unguium: Secondary | ICD-10-CM | POA: Diagnosis not present

## 2013-01-12 DIAGNOSIS — D2370 Other benign neoplasm of skin of unspecified lower limb, including hip: Secondary | ICD-10-CM | POA: Diagnosis not present

## 2013-01-12 DIAGNOSIS — M79609 Pain in unspecified limb: Secondary | ICD-10-CM | POA: Diagnosis not present

## 2013-02-08 DIAGNOSIS — C61 Malignant neoplasm of prostate: Secondary | ICD-10-CM | POA: Diagnosis not present

## 2013-02-08 DIAGNOSIS — N401 Enlarged prostate with lower urinary tract symptoms: Secondary | ICD-10-CM | POA: Diagnosis not present

## 2013-02-25 DIAGNOSIS — B356 Tinea cruris: Secondary | ICD-10-CM | POA: Diagnosis not present

## 2013-02-25 DIAGNOSIS — F341 Dysthymic disorder: Secondary | ICD-10-CM | POA: Diagnosis not present

## 2013-02-25 DIAGNOSIS — I10 Essential (primary) hypertension: Secondary | ICD-10-CM | POA: Diagnosis not present

## 2013-02-25 DIAGNOSIS — D649 Anemia, unspecified: Secondary | ICD-10-CM | POA: Diagnosis not present

## 2013-04-19 DIAGNOSIS — F411 Generalized anxiety disorder: Secondary | ICD-10-CM | POA: Diagnosis not present

## 2013-04-19 DIAGNOSIS — Z79899 Other long term (current) drug therapy: Secondary | ICD-10-CM | POA: Diagnosis not present

## 2013-04-19 DIAGNOSIS — M545 Low back pain, unspecified: Secondary | ICD-10-CM | POA: Diagnosis not present

## 2013-04-19 DIAGNOSIS — C61 Malignant neoplasm of prostate: Secondary | ICD-10-CM | POA: Diagnosis not present

## 2013-04-19 DIAGNOSIS — E559 Vitamin D deficiency, unspecified: Secondary | ICD-10-CM | POA: Diagnosis not present

## 2013-04-19 DIAGNOSIS — I10 Essential (primary) hypertension: Secondary | ICD-10-CM | POA: Diagnosis not present

## 2013-04-19 DIAGNOSIS — Z0000 Encounter for general adult medical examination without abnormal findings: Secondary | ICD-10-CM | POA: Diagnosis not present

## 2013-05-18 DIAGNOSIS — B351 Tinea unguium: Secondary | ICD-10-CM | POA: Diagnosis not present

## 2013-05-18 DIAGNOSIS — L988 Other specified disorders of the skin and subcutaneous tissue: Secondary | ICD-10-CM | POA: Diagnosis not present

## 2013-05-18 DIAGNOSIS — L84 Corns and callosities: Secondary | ICD-10-CM | POA: Diagnosis not present

## 2013-05-18 DIAGNOSIS — M79609 Pain in unspecified limb: Secondary | ICD-10-CM | POA: Diagnosis not present

## 2013-05-18 DIAGNOSIS — I739 Peripheral vascular disease, unspecified: Secondary | ICD-10-CM | POA: Diagnosis not present

## 2013-06-01 DIAGNOSIS — R972 Elevated prostate specific antigen [PSA]: Secondary | ICD-10-CM | POA: Diagnosis not present

## 2013-06-04 DIAGNOSIS — L570 Actinic keratosis: Secondary | ICD-10-CM | POA: Diagnosis not present

## 2013-06-04 DIAGNOSIS — L219 Seborrheic dermatitis, unspecified: Secondary | ICD-10-CM | POA: Diagnosis not present

## 2013-06-04 DIAGNOSIS — D485 Neoplasm of uncertain behavior of skin: Secondary | ICD-10-CM | POA: Diagnosis not present

## 2013-06-08 DIAGNOSIS — R21 Rash and other nonspecific skin eruption: Secondary | ICD-10-CM | POA: Diagnosis not present

## 2013-06-08 DIAGNOSIS — N529 Male erectile dysfunction, unspecified: Secondary | ICD-10-CM | POA: Diagnosis not present

## 2013-06-08 DIAGNOSIS — Z0000 Encounter for general adult medical examination without abnormal findings: Secondary | ICD-10-CM | POA: Diagnosis not present

## 2013-06-08 DIAGNOSIS — I10 Essential (primary) hypertension: Secondary | ICD-10-CM | POA: Diagnosis not present

## 2013-06-08 DIAGNOSIS — F341 Dysthymic disorder: Secondary | ICD-10-CM | POA: Diagnosis not present

## 2013-06-08 DIAGNOSIS — A630 Anogenital (venereal) warts: Secondary | ICD-10-CM | POA: Diagnosis not present

## 2013-06-08 DIAGNOSIS — Z7251 High risk heterosexual behavior: Secondary | ICD-10-CM | POA: Diagnosis not present

## 2013-07-07 DIAGNOSIS — N401 Enlarged prostate with lower urinary tract symptoms: Secondary | ICD-10-CM | POA: Diagnosis not present

## 2013-07-07 DIAGNOSIS — C61 Malignant neoplasm of prostate: Secondary | ICD-10-CM | POA: Diagnosis not present

## 2013-07-07 DIAGNOSIS — N529 Male erectile dysfunction, unspecified: Secondary | ICD-10-CM | POA: Diagnosis not present

## 2013-08-11 ENCOUNTER — Encounter (HOSPITAL_COMMUNITY): Payer: Self-pay | Admitting: Emergency Medicine

## 2013-08-11 ENCOUNTER — Emergency Department (HOSPITAL_COMMUNITY)
Admission: EM | Admit: 2013-08-11 | Discharge: 2013-08-11 | Disposition: A | Discharge status: Home / Self Care | Payer: Medicare Other | Attending: Emergency Medicine | Admitting: Emergency Medicine

## 2013-08-11 DIAGNOSIS — Z79899 Other long term (current) drug therapy: Secondary | ICD-10-CM | POA: Diagnosis not present

## 2013-08-11 DIAGNOSIS — D509 Iron deficiency anemia, unspecified: Secondary | ICD-10-CM | POA: Insufficient documentation

## 2013-08-11 DIAGNOSIS — K219 Gastro-esophageal reflux disease without esophagitis: Secondary | ICD-10-CM | POA: Insufficient documentation

## 2013-08-11 DIAGNOSIS — Z8669 Personal history of other diseases of the nervous system and sense organs: Secondary | ICD-10-CM | POA: Insufficient documentation

## 2013-08-11 DIAGNOSIS — F3289 Other specified depressive episodes: Secondary | ICD-10-CM | POA: Diagnosis not present

## 2013-08-11 DIAGNOSIS — I10 Essential (primary) hypertension: Secondary | ICD-10-CM | POA: Diagnosis not present

## 2013-08-11 DIAGNOSIS — F329 Major depressive disorder, single episode, unspecified: Secondary | ICD-10-CM | POA: Diagnosis not present

## 2013-08-11 DIAGNOSIS — Z765 Malingerer [conscious simulation]: Secondary | ICD-10-CM | POA: Diagnosis not present

## 2013-08-11 DIAGNOSIS — K029 Dental caries, unspecified: Secondary | ICD-10-CM | POA: Diagnosis not present

## 2013-08-11 DIAGNOSIS — K047 Periapical abscess without sinus: Secondary | ICD-10-CM | POA: Diagnosis not present

## 2013-08-11 DIAGNOSIS — Z8546 Personal history of malignant neoplasm of prostate: Secondary | ICD-10-CM | POA: Diagnosis not present

## 2013-08-11 MED ORDER — PENICILLIN V POTASSIUM 500 MG PO TABS: 500.0000 mg | ORAL_TABLET | Freq: Four times a day (QID) | ORAL | Status: DC

## 2013-08-11 MED ORDER — HYDROCODONE-ACETAMINOPHEN 5-325 MG PO TABS
1.0000 | ORAL_TABLET | Freq: Once | ORAL | Status: AC
  Administered 2013-08-11: 1 via ORAL
  Filled 2013-08-11: qty 1

## 2013-08-11 MED ORDER — PENICILLIN V POTASSIUM 500 MG PO TABS
500.0000 mg | ORAL_TABLET | Freq: Once | ORAL | Status: AC
  Administered 2013-08-11: 500 mg via ORAL
  Filled 2013-08-11: qty 1

## 2013-08-11 MED ORDER — HYDROCODONE-ACETAMINOPHEN 5-325 MG PO TABS: 1.0000 | ORAL_TABLET | Freq: Four times a day (QID) | ORAL | Status: DC | PRN

## 2013-08-11 NOTE — ED Notes (Signed)
Pt c/o dental pain for week but gotten progressively worse in the past couple of days.  Pt states that he needs hydrocodone but stronger than 5-325 bc "they just dont help my pain".

## 2013-08-11 NOTE — ED Provider Notes (Addendum)
CSN: [id number]     Arrival date & time 08/11/13  0254 History   First MD Initiated Contact with Patient 08/11/13 0831     Chief Complaint  Patient presents with  . Dental Pain     (Consider location/radiation/quality/duration/timing/severity/associated sxs/prior Treatment) HPI Patient reports he has a broken tooth on his right lower molars that has been that way for almost a year. He was seen in the ED last August when it got infected. He states over the past 3 days he started getting some puffiness of the face and swelling and yesterday the tooth got painful. He states chewing and cold liquids make the pain worse. Air does not make it  hurt more. Nothing makes it to feel better. He denies fever, difficulty breathing or swallowing. He states he has a Pharmacist, [facility] [address]. that he has not seen for a couple years. He states he is saving money for possible crown.  PCP Dr Karlton Lemon  Past Medical History  Diagnosis Date  . Microcytic anemia   . Prostate cancer 08/2009  . GERD (gastroesophageal reflux disease)   . Macular degeneration   . Hypertension   . Depression   . Hemorrhoids   . Tubular adenoma    Past Surgical History  Procedure Laterality Date  . Prostate biopsy      x 2  . Left ankle joint fusion  1981  . Inguinal hernia repair     Family History  Problem Relation Age of Onset  . Lung cancer Mother     Deceased  . Throat cancer Brother   . Pancreatic cancer Father   . Heart disease Father     Deceased  . Lung cancer Maternal Uncle     nephew  . Diabetes Other    History  Substance Use Topics  . Smoking status: Never Smoker   . Smokeless tobacco: Never Used  . Alcohol Use: Yes     Comment: Occasional    Review of Systems  All other systems reviewed and are negative.     Allergies  Review of patient's allergies indicates no known allergies.  Home Medications   Prior to Admission medications   Medication Sig Start Date End Date Taking? Authorizing  Provider  ALPRAZolam Duanne Moron) 1 MG tablet Take 1 mg by mouth at bedtime as needed.  01/17/12   Historical Provider, MD  BENICAR HCT 40-25 MG per tablet Take 1 tablet by mouth every morning.  12/30/11   Historical Provider, MD  divalproex (DEPAKOTE) 500 MG DR tablet Take 500 mg by mouth 3 (three) times daily. Take 1 tab    Historical Provider, MD  ferrous gluconate (FERGON) 325 MG tablet Take 325 mg by mouth 2 (two) times daily.    Historical Provider, MD  HYDROcodone-acetaminophen (NORCO/VICODIN) 5-325 MG per tablet Take 1 tablet by mouth every 4 (four) hours as needed for pain. 10/30/12   Larene Pickett, PA-C  HYDROcodone-acetaminophen (NORCO/VICODIN) 5-325 MG per tablet Take 1-2 tablets by mouth every 6 (six) hours as needed for moderate pain. 08/11/13   Janice Norrie, MD  HYDROcodone-acetaminophen (VICODIN) 5-500 MG per tablet Take 1 tablet by mouth as needed.  01/23/12   Historical Provider, MD  hydrocortisone (ANUSOL-HC) 25 MG suppository Place 1 suppository (25 mg total) rectally at bedtime. 01/05/13   Lafayette Dragon, MD  hydrOXYzine (ATARAX/VISTARIL) 25 MG tablet  01/05/13   Historical Provider, MD  Iron-FA-B Cmp-C-Biot-Probiotic (FUSION PLUS) CAPS Take 1 capsule by mouth daily. 01/05/13  Lafayette Dragon, MD  metoprolol (LOPRESSOR) 50 MG tablet Take 50 mg by mouth daily.    Historical Provider, MD  omeprazole (PRILOSEC) 40 MG capsule Take 1 capsule (40 mg total) by mouth daily. 10/23/12   Lafayette Dragon, MD  Phenyleph-CPM-DM-Aspirin (ALKA-SELTZER PLUS COLD & COUGH PO) Take by mouth. Dissolve in water    Historical Provider, MD   BP 142/101  Pulse 60  Temp(Src) 97.7 F (36.5 C) (Oral)  Resp 20  SpO2 96%  Vital signs normal except hypertension  Physical Exam  Nursing note and vitals reviewed. Constitutional: He is oriented to person, place, and time. He appears well-developed and well-nourished.  Non-toxic appearance. He does not appear ill. No distress.  HENT:  Head: Normocephalic and  atraumatic.  Right Ear: External ear normal.  Left Ear: External ear normal.  Nose: Nose normal. No mucosal edema or rhinorrhea.  Mouth/Throat: Oropharynx is clear and moist and mucous membranes are normal. No dental abscesses or uvula swelling.    Eyes: Conjunctivae and EOM are normal. Pupils are equal, round, and reactive to light.  Neck: Normal range of motion and full passive range of motion without pain. Neck supple.  Pulmonary/Chest: Effort normal and breath sounds normal. No respiratory distress. He has no rhonchi. He exhibits no crepitus.  Abdominal: Soft. Normal appearance and bowel sounds are normal.  Musculoskeletal: Normal range of motion. He exhibits no edema and no tenderness.  Moves all extremities well.   Lymphadenopathy:    He has no cervical adenopathy.  Neurological: He is alert and oriented to person, place, and time. He has normal strength. No cranial nerve deficit.  Skin: Skin is warm, dry and intact. No rash noted. No erythema. No pallor.  Psychiatric: He has a normal mood and affect. His speech is normal and behavior is normal. His mood appears not anxious.    ED Course  Procedures (including critical care time)  Medications  penicillin v potassium (VEETID) tablet 500 mg (500 mg Oral Given 08/11/13 0903)  HYDROcodone-acetaminophen (NORCO/VICODIN) 5-325 MG per tablet 1 tablet (1 tablet Oral Given 08/11/13 0903)    Patient was advised that too much of the tooth is gone to consider having a crown done. Because some much the tooth is gone he may need oral surgery to extract the tooth by excising gums. We discussed that he needed definitive treatment of his tooth, antibiotics were only temporary solution for the acute infection.  Nurse reports patient is very insistent on getting 7.5 mg of hydrocodone. He was explained to several times that he got in the 5 mg in the ED in August and November when he was seen. This prompted me to look at the [facility].  Patient has been getting #80 hydrocodone 5/325 on a monthly basis by his PCP. His last prescription was filled May 23 for #80 tablets which is only 4 days ago.  Pt has already left the ED, hopefully his pharmacy will call back so the prescription can be cancelled and he can be arrested.   Labs Review Labs Reviewed - No data to display  Imaging Review No results found.   EKG Interpretation None      MDM   Final diagnoses:  Abscessed tooth  Dental caries  Drug-seeking behavior   Discharge Medication List as of 08/11/2013  8:51 AM    START taking these medications   Details  !! HYDROcodone-acetaminophen (NORCO/VICODIN) 5-325 MG per tablet Take 1-2 tablets by mouth every 6 (six) hours as  needed for moderate pain., Starting 08/11/2013, Until Discontinued, Print     !! - Potential duplicate medications found. Please discuss with provider.    Pen VK 500 mg QID # 40  Plan discharge   Rolland Porter, MD, Alanson Aly, MD 08/11/13 [place], MD 08/11/13 2130  Janice Norrie, MD 08/11/13 0930

## 2013-08-11 NOTE — ED Notes (Signed)
Notified MD that pt continues to be upset about amount of meds.  Explained to pt that we are not increasing dose.  Pt states he needed meds until antibiotics finished.  Made statement that he was not doing to have the tooth taken out.  That once antibiotic kicks in, he does not have problems with his tooth for a while.   Notified Dr. Tomi Bamberger.

## 2013-08-11 NOTE — ED Notes (Signed)
Pt questioning amount of dosage of vicodin.  Pt wanted 7.5 mg hydrocodone.  Also questioned quantity.  Stated that the meds do not do anything at that low of a dose.

## 2013-08-11 NOTE — Discharge Instructions (Signed)
Warmth to your face.  Take the penicillin until gone. Take the hydrocodone for pain with ibuprofen 600 mg 4 times a day. Your tooth has almost decayed the whole crown (solid portion of the tooth) to the roots. You will need to see an oral surgeon, most likely, to have it removed Dental Abscess A dental abscess is a collection of infected fluid (pus) from a bacterial infection in the inner part of the tooth (pulp). It usually occurs at the end of the tooth's root.  CAUSES   Severe tooth decay.  Trauma to the tooth that allows bacteria to enter into the pulp, such as a broken or chipped tooth. SYMPTOMS   Severe pain in and around the infected tooth.  Swelling and redness around the abscessed tooth or in the mouth or face.  Tenderness.  Pus drainage.  Bad breath.  Bitter taste in the mouth.  Difficulty swallowing.  Difficulty opening the mouth.  Nausea.  Vomiting.  Chills.  Swollen neck glands. DIAGNOSIS   A medical and dental history will be taken.  An examination will be performed by tapping on the abscessed tooth.  X-rays may be taken of the tooth to identify the abscess. TREATMENT The goal of treatment is to eliminate the infection. You may be prescribed antibiotic medicine to stop the infection from spreading. A root canal may be performed to save the tooth. If the tooth cannot be saved, it may be pulled (extracted) and the abscess may be drained.  HOME CARE INSTRUCTIONS  Only take over-the-counter or prescription medicines for pain, fever, or discomfort as directed by your caregiver.  Rinse your mouth (gargle) often with salt water ( tsp salt in 8 oz [250 ml] of warm water) to relieve pain or swelling.  Do not drive after taking pain medicine (narcotics).  Do not apply heat to the outside of your face.  Return to your dentist for further treatment as directed. SEEK MEDICAL CARE IF:  Your pain is not helped by medicine.  Your pain is getting worse instead  of better. SEEK IMMEDIATE MEDICAL CARE IF:  You have a fever or persistent symptoms for more than 2 3 days.  You have a fever and your symptoms suddenly get worse.  You have chills or a very bad headache.  You have problems breathing or swallowing.  You have trouble opening your mouth.  You have swelling in the neck or around the eye. Document Released: 03/04/2005 Document Revised: 11/27/2011 Document Reviewed: 06/12/2010 [facility] Patient Information 2014 [place], [place].  Dental Caries  Dental caries (also called tooth decay) is the most common oral disease. It can occur at any age, but is more common in children and young adults.  HOW DENTAL CARIES DEVELOPS  The process of decay begins when bacteria and foods (particularly sugars and starches) combine in your mouth to produce plaque. Plaque is a substance that sticks to the hard, outer surface of a tooth (enamel). The bacteria in plaque produce acids that attack enamel. These acids may also attack the root surface of a tooth (cementum) if it is exposed. Repeated attacks dissolve these surfaces and create holes in the tooth (cavities). If left untreated, the acids destroy the other layers of the tooth.  RISK FACTORS  Frequent sipping of sugary beverages.   Frequent snacking on sugary and starchy foods, especially those that easily get stuck in the teeth.   Poor oral hygiene.   Dry mouth.   Substance abuse such as methamphetamine abuse.   Broken  or poor-fitting dental restorations.   Eating disorders.   Gastroesophageal reflux disease (GERD).   Certain radiation treatments to the head and neck. SYMPTOMS In the early stages of dental caries, symptoms are seldom present. Sometimes white, chalky areas may be seen on the enamel or other tooth layers. In later stages, symptoms may include:  Pits and holes on the enamel.  Toothache after sweet, hot, or cold foods or drinks are consumed.  Pain around the  tooth.  Swelling around the tooth. DIAGNOSIS  Most of the time, dental caries is detected during a regular dental checkup. A diagnosis is made after a thorough medical and dental history is taken and the surfaces of your teeth are checked for signs of dental caries. Sometimes special instruments, such as lasers, are used to check for dental caries. Dental X-ray exams may be taken so that areas not visible to the eye (such as between the contact areas of the teeth) can be checked for cavities.  TREATMENT  If dental caries is in its early stages, it may be reversed with a fluoride treatment or an application of a remineralizing agent at the dental office. Thorough brushing and flossing at home is needed to aid these treatments. If it is in its later stages, treatment depends on the location and extent of tooth destruction:   If a small area of the tooth has been destroyed, the destroyed area will be removed and cavities will be filled with a material such as gold, silver amalgam, or composite resin.   If a large area of the tooth has been destroyed, the destroyed area will be removed and a cap (crown) will be fitted over the remaining tooth structure.   If the center part of the tooth (pulp) is affected, a procedure called a root canal will be needed before a filling or crown can be placed.   If most of the tooth has been destroyed, the tooth may need to be pulled (extracted). HOME CARE INSTRUCTIONS You can prevent, stop, or reverse dental caries at home by practicing good oral hygiene. Good oral hygiene includes:  Thoroughly cleaning your teeth at least twice a day with a toothbrush and dental floss.   Using a fluoride toothpaste. A fluoride mouth rinse may also be used if recommended by your dentist or health care provider.   Restricting the amount of sugary and starchy foods and sugary liquids you consume.   Avoiding frequent snacking on these foods and sipping of these liquids.    Keeping regular visits with a dentist for checkups and cleanings. PREVENTION   Practice good oral hygiene.  Consider a dental sealant. A dental sealant is a coating material that is applied by your dentist to the pits and grooves of teeth. The sealant prevents food from being trapped in them. It may protect the teeth for several years.  Ask about fluoride supplements if you live in a community without fluorinated water or with water that has a low fluoride content. Use fluoride supplements as directed by your dentist or health care provider.  Allow fluoride varnish applications to teeth if directed by your dentist or health care provider. Document Released: 11/24/2001 Document Revised: 11/04/2012 Document Reviewed: 03/06/2012 [facility] Patient Information 2014 [place]. Marland Kitchen   Return to the ED if you get a fever, have difficulty swallowing or breathing.

## 2013-09-03 DIAGNOSIS — B351 Tinea unguium: Secondary | ICD-10-CM | POA: Diagnosis not present

## 2013-09-03 DIAGNOSIS — L608 Other nail disorders: Secondary | ICD-10-CM | POA: Diagnosis not present

## 2013-09-03 DIAGNOSIS — M79609 Pain in unspecified limb: Secondary | ICD-10-CM | POA: Diagnosis not present

## 2013-09-03 DIAGNOSIS — L84 Corns and callosities: Secondary | ICD-10-CM | POA: Diagnosis not present

## 2013-09-03 DIAGNOSIS — I739 Peripheral vascular disease, unspecified: Secondary | ICD-10-CM | POA: Diagnosis not present

## 2013-10-11 DIAGNOSIS — I10 Essential (primary) hypertension: Secondary | ICD-10-CM | POA: Diagnosis not present

## 2013-10-11 DIAGNOSIS — M545 Low back pain, unspecified: Secondary | ICD-10-CM | POA: Diagnosis not present

## 2013-10-11 DIAGNOSIS — Z79899 Other long term (current) drug therapy: Secondary | ICD-10-CM | POA: Diagnosis not present

## 2013-10-11 DIAGNOSIS — F411 Generalized anxiety disorder: Secondary | ICD-10-CM | POA: Diagnosis not present

## 2013-12-02 DIAGNOSIS — L608 Other nail disorders: Secondary | ICD-10-CM | POA: Diagnosis not present

## 2013-12-02 DIAGNOSIS — I739 Peripheral vascular disease, unspecified: Secondary | ICD-10-CM | POA: Diagnosis not present

## 2013-12-02 DIAGNOSIS — L84 Corns and callosities: Secondary | ICD-10-CM | POA: Diagnosis not present

## 2013-12-09 DIAGNOSIS — M545 Low back pain, unspecified: Secondary | ICD-10-CM | POA: Diagnosis not present

## 2013-12-09 DIAGNOSIS — E669 Obesity, unspecified: Secondary | ICD-10-CM | POA: Diagnosis not present

## 2013-12-09 DIAGNOSIS — I10 Essential (primary) hypertension: Secondary | ICD-10-CM | POA: Diagnosis not present

## 2013-12-09 DIAGNOSIS — R51 Headache: Secondary | ICD-10-CM | POA: Diagnosis not present

## 2013-12-09 DIAGNOSIS — R7309 Other abnormal glucose: Secondary | ICD-10-CM | POA: Diagnosis not present

## 2014-01-05 DIAGNOSIS — E668 Other obesity: Secondary | ICD-10-CM | POA: Diagnosis not present

## 2014-01-05 DIAGNOSIS — I10 Essential (primary) hypertension: Secondary | ICD-10-CM | POA: Diagnosis not present

## 2014-01-05 DIAGNOSIS — F341 Dysthymic disorder: Secondary | ICD-10-CM | POA: Diagnosis not present

## 2014-01-05 DIAGNOSIS — F411 Generalized anxiety disorder: Secondary | ICD-10-CM | POA: Diagnosis not present

## 2014-01-28 DIAGNOSIS — N528 Other male erectile dysfunction: Secondary | ICD-10-CM | POA: Diagnosis not present

## 2014-01-28 DIAGNOSIS — N400 Enlarged prostate without lower urinary tract symptoms: Secondary | ICD-10-CM | POA: Diagnosis not present

## 2014-01-28 DIAGNOSIS — C61 Malignant neoplasm of prostate: Secondary | ICD-10-CM | POA: Diagnosis not present

## 2014-02-01 DIAGNOSIS — L570 Actinic keratosis: Secondary | ICD-10-CM | POA: Diagnosis not present

## 2014-02-01 DIAGNOSIS — D485 Neoplasm of uncertain behavior of skin: Secondary | ICD-10-CM | POA: Diagnosis not present

## 2014-02-01 DIAGNOSIS — D239 Other benign neoplasm of skin, unspecified: Secondary | ICD-10-CM | POA: Diagnosis not present

## 2014-02-04 DIAGNOSIS — Z634 Disappearance and death of family member: Secondary | ICD-10-CM | POA: Diagnosis not present

## 2014-02-04 DIAGNOSIS — F411 Generalized anxiety disorder: Secondary | ICD-10-CM | POA: Diagnosis not present

## 2014-02-04 DIAGNOSIS — Z79899 Other long term (current) drug therapy: Secondary | ICD-10-CM | POA: Diagnosis not present

## 2014-02-04 DIAGNOSIS — I10 Essential (primary) hypertension: Secondary | ICD-10-CM | POA: Diagnosis not present

## 2014-02-25 DIAGNOSIS — C61 Malignant neoplasm of prostate: Secondary | ICD-10-CM | POA: Diagnosis not present

## 2014-03-23 DIAGNOSIS — C61 Malignant neoplasm of prostate: Secondary | ICD-10-CM | POA: Diagnosis not present

## 2014-03-24 DIAGNOSIS — E119 Type 2 diabetes mellitus without complications: Secondary | ICD-10-CM | POA: Diagnosis not present

## 2014-03-24 DIAGNOSIS — F411 Generalized anxiety disorder: Secondary | ICD-10-CM | POA: Diagnosis not present

## 2014-03-24 DIAGNOSIS — E668 Other obesity: Secondary | ICD-10-CM | POA: Diagnosis not present

## 2014-03-24 DIAGNOSIS — I10 Essential (primary) hypertension: Secondary | ICD-10-CM | POA: Diagnosis not present

## 2014-04-15 DIAGNOSIS — N401 Enlarged prostate with lower urinary tract symptoms: Secondary | ICD-10-CM | POA: Diagnosis not present

## 2014-04-15 DIAGNOSIS — C61 Malignant neoplasm of prostate: Secondary | ICD-10-CM | POA: Diagnosis not present

## 2014-04-20 DIAGNOSIS — R300 Dysuria: Secondary | ICD-10-CM | POA: Diagnosis not present

## 2014-04-20 DIAGNOSIS — C61 Malignant neoplasm of prostate: Secondary | ICD-10-CM | POA: Diagnosis not present

## 2014-04-20 DIAGNOSIS — K4020 Bilateral inguinal hernia, without obstruction or gangrene, not specified as recurrent: Secondary | ICD-10-CM | POA: Diagnosis not present

## 2014-04-26 DIAGNOSIS — I10 Essential (primary) hypertension: Secondary | ICD-10-CM | POA: Diagnosis not present

## 2014-04-26 DIAGNOSIS — E668 Other obesity: Secondary | ICD-10-CM | POA: Diagnosis not present

## 2014-04-26 DIAGNOSIS — E119 Type 2 diabetes mellitus without complications: Secondary | ICD-10-CM | POA: Diagnosis not present

## 2014-04-26 DIAGNOSIS — F411 Generalized anxiety disorder: Secondary | ICD-10-CM | POA: Diagnosis not present

## 2014-04-26 DIAGNOSIS — Z5181 Encounter for therapeutic drug level monitoring: Secondary | ICD-10-CM | POA: Diagnosis not present

## 2014-04-26 DIAGNOSIS — Z125 Encounter for screening for malignant neoplasm of prostate: Secondary | ICD-10-CM | POA: Diagnosis not present

## 2014-04-26 DIAGNOSIS — Z79899 Other long term (current) drug therapy: Secondary | ICD-10-CM | POA: Diagnosis not present

## 2014-04-26 DIAGNOSIS — M545 Low back pain: Secondary | ICD-10-CM | POA: Diagnosis not present

## 2014-04-26 DIAGNOSIS — Z0000 Encounter for general adult medical examination without abnormal findings: Secondary | ICD-10-CM | POA: Diagnosis not present

## 2014-05-16 DIAGNOSIS — D492 Neoplasm of unspecified behavior of bone, soft tissue, and skin: Secondary | ICD-10-CM | POA: Diagnosis not present

## 2014-05-16 DIAGNOSIS — I739 Peripheral vascular disease, unspecified: Secondary | ICD-10-CM | POA: Diagnosis not present

## 2014-05-16 DIAGNOSIS — L603 Nail dystrophy: Secondary | ICD-10-CM | POA: Diagnosis not present

## 2014-05-27 DIAGNOSIS — F411 Generalized anxiety disorder: Secondary | ICD-10-CM | POA: Diagnosis not present

## 2014-05-27 DIAGNOSIS — E668 Other obesity: Secondary | ICD-10-CM | POA: Diagnosis not present

## 2014-05-27 DIAGNOSIS — E119 Type 2 diabetes mellitus without complications: Secondary | ICD-10-CM | POA: Diagnosis not present

## 2014-05-27 DIAGNOSIS — Z79899 Other long term (current) drug therapy: Secondary | ICD-10-CM | POA: Diagnosis not present

## 2014-05-27 DIAGNOSIS — I10 Essential (primary) hypertension: Secondary | ICD-10-CM | POA: Diagnosis not present

## 2014-06-27 DIAGNOSIS — I10 Essential (primary) hypertension: Secondary | ICD-10-CM | POA: Diagnosis not present

## 2014-06-27 DIAGNOSIS — F411 Generalized anxiety disorder: Secondary | ICD-10-CM | POA: Diagnosis not present

## 2014-06-27 DIAGNOSIS — Z79899 Other long term (current) drug therapy: Secondary | ICD-10-CM | POA: Diagnosis not present

## 2014-06-27 DIAGNOSIS — M545 Low back pain: Secondary | ICD-10-CM | POA: Diagnosis not present

## 2014-06-30 DIAGNOSIS — L309 Dermatitis, unspecified: Secondary | ICD-10-CM | POA: Diagnosis not present

## 2014-08-11 DIAGNOSIS — I10 Essential (primary) hypertension: Secondary | ICD-10-CM | POA: Diagnosis not present

## 2014-08-11 DIAGNOSIS — F064 Anxiety disorder due to known physiological condition: Secondary | ICD-10-CM | POA: Diagnosis not present

## 2014-08-11 DIAGNOSIS — Z5181 Encounter for therapeutic drug level monitoring: Secondary | ICD-10-CM | POA: Diagnosis not present

## 2014-08-11 DIAGNOSIS — E119 Type 2 diabetes mellitus without complications: Secondary | ICD-10-CM | POA: Diagnosis not present

## 2014-08-11 DIAGNOSIS — Z79899 Other long term (current) drug therapy: Secondary | ICD-10-CM | POA: Diagnosis not present

## 2014-08-11 DIAGNOSIS — F329 Major depressive disorder, single episode, unspecified: Secondary | ICD-10-CM | POA: Diagnosis not present

## 2014-08-25 DIAGNOSIS — B351 Tinea unguium: Secondary | ICD-10-CM | POA: Diagnosis not present

## 2014-08-25 DIAGNOSIS — I739 Peripheral vascular disease, unspecified: Secondary | ICD-10-CM | POA: Diagnosis not present

## 2014-08-25 DIAGNOSIS — M79609 Pain in unspecified limb: Secondary | ICD-10-CM | POA: Diagnosis not present

## 2014-08-25 DIAGNOSIS — L84 Corns and callosities: Secondary | ICD-10-CM | POA: Diagnosis not present

## 2014-09-08 DIAGNOSIS — E119 Type 2 diabetes mellitus without complications: Secondary | ICD-10-CM | POA: Diagnosis not present

## 2014-09-08 DIAGNOSIS — F419 Anxiety disorder, unspecified: Secondary | ICD-10-CM | POA: Diagnosis not present

## 2014-09-08 DIAGNOSIS — I10 Essential (primary) hypertension: Secondary | ICD-10-CM | POA: Diagnosis not present

## 2014-09-08 DIAGNOSIS — Z79899 Other long term (current) drug therapy: Secondary | ICD-10-CM | POA: Diagnosis not present

## 2014-09-08 DIAGNOSIS — M545 Low back pain: Secondary | ICD-10-CM | POA: Diagnosis not present

## 2014-09-08 DIAGNOSIS — C61 Malignant neoplasm of prostate: Secondary | ICD-10-CM | POA: Diagnosis not present

## 2014-09-08 DIAGNOSIS — Z5181 Encounter for therapeutic drug level monitoring: Secondary | ICD-10-CM | POA: Diagnosis not present

## 2014-09-08 DIAGNOSIS — F411 Generalized anxiety disorder: Secondary | ICD-10-CM | POA: Diagnosis not present

## 2014-10-06 DIAGNOSIS — Z7982 Long term (current) use of aspirin: Secondary | ICD-10-CM | POA: Diagnosis not present

## 2014-10-06 DIAGNOSIS — I10 Essential (primary) hypertension: Secondary | ICD-10-CM | POA: Diagnosis not present

## 2014-10-06 DIAGNOSIS — K088 Other specified disorders of teeth and supporting structures: Secondary | ICD-10-CM | POA: Diagnosis not present

## 2014-10-06 DIAGNOSIS — F411 Generalized anxiety disorder: Secondary | ICD-10-CM | POA: Diagnosis not present

## 2014-11-09 DIAGNOSIS — K08409 Partial loss of teeth, unspecified cause, unspecified class: Secondary | ICD-10-CM | POA: Diagnosis not present

## 2014-11-09 DIAGNOSIS — I10 Essential (primary) hypertension: Secondary | ICD-10-CM | POA: Diagnosis not present

## 2014-11-09 DIAGNOSIS — F411 Generalized anxiety disorder: Secondary | ICD-10-CM | POA: Diagnosis not present

## 2014-11-09 DIAGNOSIS — M545 Low back pain: Secondary | ICD-10-CM | POA: Diagnosis not present

## 2014-11-17 DIAGNOSIS — I739 Peripheral vascular disease, unspecified: Secondary | ICD-10-CM | POA: Diagnosis not present

## 2014-11-17 DIAGNOSIS — L84 Corns and callosities: Secondary | ICD-10-CM | POA: Diagnosis not present

## 2014-11-17 DIAGNOSIS — L603 Nail dystrophy: Secondary | ICD-10-CM | POA: Diagnosis not present

## 2014-12-08 DIAGNOSIS — Z79899 Other long term (current) drug therapy: Secondary | ICD-10-CM | POA: Diagnosis not present

## 2014-12-08 DIAGNOSIS — F411 Generalized anxiety disorder: Secondary | ICD-10-CM | POA: Diagnosis not present

## 2014-12-08 DIAGNOSIS — H6121 Impacted cerumen, right ear: Secondary | ICD-10-CM | POA: Diagnosis not present

## 2014-12-08 DIAGNOSIS — M545 Low back pain: Secondary | ICD-10-CM | POA: Diagnosis not present

## 2014-12-08 DIAGNOSIS — I10 Essential (primary) hypertension: Secondary | ICD-10-CM | POA: Diagnosis not present

## 2014-12-08 DIAGNOSIS — E119 Type 2 diabetes mellitus without complications: Secondary | ICD-10-CM | POA: Diagnosis not present

## 2014-12-08 DIAGNOSIS — J069 Acute upper respiratory infection, unspecified: Secondary | ICD-10-CM | POA: Diagnosis not present

## 2015-01-10 DIAGNOSIS — K08409 Partial loss of teeth, unspecified cause, unspecified class: Secondary | ICD-10-CM | POA: Diagnosis not present

## 2015-01-10 DIAGNOSIS — E119 Type 2 diabetes mellitus without complications: Secondary | ICD-10-CM | POA: Diagnosis not present

## 2015-01-10 DIAGNOSIS — I10 Essential (primary) hypertension: Secondary | ICD-10-CM | POA: Diagnosis not present

## 2015-01-10 DIAGNOSIS — F411 Generalized anxiety disorder: Secondary | ICD-10-CM | POA: Diagnosis not present

## 2015-05-01 DIAGNOSIS — D2372 Other benign neoplasm of skin of left lower limb, including hip: Secondary | ICD-10-CM | POA: Diagnosis not present

## 2015-05-01 DIAGNOSIS — B353 Tinea pedis: Secondary | ICD-10-CM | POA: Diagnosis not present

## 2015-05-24 DIAGNOSIS — Z9114 Patient's other noncompliance with medication regimen: Secondary | ICD-10-CM | POA: Diagnosis not present

## 2015-05-24 DIAGNOSIS — K0889 Other specified disorders of teeth and supporting structures: Secondary | ICD-10-CM | POA: Diagnosis not present

## 2015-05-24 DIAGNOSIS — E119 Type 2 diabetes mellitus without complications: Secondary | ICD-10-CM | POA: Diagnosis not present

## 2015-05-24 DIAGNOSIS — Z9111 Patient's noncompliance with dietary regimen: Secondary | ICD-10-CM | POA: Diagnosis not present

## 2015-05-26 DIAGNOSIS — D2372 Other benign neoplasm of skin of left lower limb, including hip: Secondary | ICD-10-CM | POA: Diagnosis not present

## 2015-06-15 DIAGNOSIS — N529 Male erectile dysfunction, unspecified: Secondary | ICD-10-CM | POA: Diagnosis not present

## 2015-06-15 DIAGNOSIS — N401 Enlarged prostate with lower urinary tract symptoms: Secondary | ICD-10-CM | POA: Diagnosis not present

## 2015-06-15 DIAGNOSIS — N138 Other obstructive and reflux uropathy: Secondary | ICD-10-CM | POA: Diagnosis not present

## 2015-06-15 DIAGNOSIS — C61 Malignant neoplasm of prostate: Secondary | ICD-10-CM | POA: Diagnosis not present

## 2015-07-03 DIAGNOSIS — C61 Malignant neoplasm of prostate: Secondary | ICD-10-CM | POA: Diagnosis not present

## 2015-07-05 DIAGNOSIS — N454 Abscess of epididymis or testis: Secondary | ICD-10-CM | POA: Diagnosis not present

## 2015-07-06 DIAGNOSIS — L304 Erythema intertrigo: Secondary | ICD-10-CM | POA: Diagnosis not present

## 2015-07-06 DIAGNOSIS — L570 Actinic keratosis: Secondary | ICD-10-CM | POA: Diagnosis not present

## 2015-07-11 DIAGNOSIS — N50819 Testicular pain, unspecified: Secondary | ICD-10-CM | POA: Diagnosis not present

## 2015-07-11 DIAGNOSIS — M549 Dorsalgia, unspecified: Secondary | ICD-10-CM | POA: Diagnosis not present

## 2015-07-11 DIAGNOSIS — N453 Epididymo-orchitis: Secondary | ICD-10-CM | POA: Diagnosis not present

## 2015-07-31 DIAGNOSIS — L603 Nail dystrophy: Secondary | ICD-10-CM | POA: Diagnosis not present

## 2015-07-31 DIAGNOSIS — L84 Corns and callosities: Secondary | ICD-10-CM | POA: Diagnosis not present

## 2015-07-31 DIAGNOSIS — I739 Peripheral vascular disease, unspecified: Secondary | ICD-10-CM | POA: Diagnosis not present

## 2015-08-01 DIAGNOSIS — C61 Malignant neoplasm of prostate: Secondary | ICD-10-CM | POA: Diagnosis not present

## 2015-08-01 DIAGNOSIS — N138 Other obstructive and reflux uropathy: Secondary | ICD-10-CM | POA: Diagnosis not present

## 2015-08-01 DIAGNOSIS — N401 Enlarged prostate with lower urinary tract symptoms: Secondary | ICD-10-CM | POA: Diagnosis not present

## 2015-08-07 DIAGNOSIS — L649 Androgenic alopecia, unspecified: Secondary | ICD-10-CM | POA: Diagnosis not present

## 2015-08-07 DIAGNOSIS — L821 Other seborrheic keratosis: Secondary | ICD-10-CM | POA: Diagnosis not present

## 2015-08-07 DIAGNOSIS — L814 Other melanin hyperpigmentation: Secondary | ICD-10-CM | POA: Diagnosis not present

## 2015-08-07 DIAGNOSIS — L298 Other pruritus: Secondary | ICD-10-CM | POA: Diagnosis not present

## 2015-08-07 DIAGNOSIS — L570 Actinic keratosis: Secondary | ICD-10-CM | POA: Diagnosis not present

## 2015-08-07 DIAGNOSIS — L245 Irritant contact dermatitis due to other chemical products: Secondary | ICD-10-CM | POA: Diagnosis not present

## 2015-08-15 DIAGNOSIS — C61 Malignant neoplasm of prostate: Secondary | ICD-10-CM | POA: Diagnosis not present

## 2015-10-02 DIAGNOSIS — N138 Other obstructive and reflux uropathy: Secondary | ICD-10-CM | POA: Diagnosis not present

## 2015-10-02 DIAGNOSIS — C61 Malignant neoplasm of prostate: Secondary | ICD-10-CM | POA: Diagnosis not present

## 2015-10-02 DIAGNOSIS — N401 Enlarged prostate with lower urinary tract symptoms: Secondary | ICD-10-CM | POA: Diagnosis not present

## 2015-10-02 DIAGNOSIS — N529 Male erectile dysfunction, unspecified: Secondary | ICD-10-CM | POA: Diagnosis not present

## 2015-10-23 DIAGNOSIS — L84 Corns and callosities: Secondary | ICD-10-CM | POA: Diagnosis not present

## 2015-10-23 DIAGNOSIS — L603 Nail dystrophy: Secondary | ICD-10-CM | POA: Diagnosis not present

## 2015-10-23 DIAGNOSIS — I739 Peripheral vascular disease, unspecified: Secondary | ICD-10-CM | POA: Diagnosis not present

## 2015-10-27 DIAGNOSIS — E119 Type 2 diabetes mellitus without complications: Secondary | ICD-10-CM | POA: Diagnosis not present

## 2015-10-27 DIAGNOSIS — Z79899 Other long term (current) drug therapy: Secondary | ICD-10-CM | POA: Diagnosis not present

## 2015-10-27 DIAGNOSIS — I10 Essential (primary) hypertension: Secondary | ICD-10-CM | POA: Diagnosis not present

## 2015-10-27 DIAGNOSIS — K0381 Cracked tooth: Secondary | ICD-10-CM | POA: Diagnosis not present

## 2015-10-27 DIAGNOSIS — Z9119 Patient's noncompliance with other medical treatment and regimen: Secondary | ICD-10-CM | POA: Diagnosis not present

## 2015-11-01 DIAGNOSIS — H3553 Other dystrophies primarily involving the sensory retina: Secondary | ICD-10-CM | POA: Diagnosis not present

## 2015-11-14 DIAGNOSIS — D225 Melanocytic nevi of trunk: Secondary | ICD-10-CM | POA: Diagnosis not present

## 2015-11-14 DIAGNOSIS — L280 Lichen simplex chronicus: Secondary | ICD-10-CM | POA: Diagnosis not present

## 2016-01-04 DIAGNOSIS — K0381 Cracked tooth: Secondary | ICD-10-CM | POA: Diagnosis not present

## 2016-01-04 DIAGNOSIS — L84 Corns and callosities: Secondary | ICD-10-CM | POA: Diagnosis not present

## 2016-01-04 DIAGNOSIS — E119 Type 2 diabetes mellitus without complications: Secondary | ICD-10-CM | POA: Diagnosis not present

## 2016-01-04 DIAGNOSIS — C61 Malignant neoplasm of prostate: Secondary | ICD-10-CM | POA: Diagnosis not present

## 2016-01-04 DIAGNOSIS — I10 Essential (primary) hypertension: Secondary | ICD-10-CM | POA: Diagnosis not present

## 2016-01-05 DIAGNOSIS — C61 Malignant neoplasm of prostate: Secondary | ICD-10-CM | POA: Diagnosis not present

## 2016-01-05 DIAGNOSIS — I10 Essential (primary) hypertension: Secondary | ICD-10-CM | POA: Diagnosis not present

## 2016-01-05 DIAGNOSIS — E119 Type 2 diabetes mellitus without complications: Secondary | ICD-10-CM | POA: Diagnosis not present

## 2016-01-05 DIAGNOSIS — K0381 Cracked tooth: Secondary | ICD-10-CM | POA: Diagnosis not present

## 2016-01-25 DIAGNOSIS — H548 Legal blindness, as defined in USA: Secondary | ICD-10-CM | POA: Diagnosis not present

## 2016-01-25 DIAGNOSIS — H3589 Other specified retinal disorders: Secondary | ICD-10-CM | POA: Diagnosis not present

## 2016-01-25 DIAGNOSIS — H3553 Other dystrophies primarily involving the sensory retina: Secondary | ICD-10-CM | POA: Diagnosis not present

## 2016-02-20 ENCOUNTER — Emergency Department (HOSPITAL_COMMUNITY)
Admission: EM | Admit: 2016-02-20 | Discharge: 2016-02-21 | Disposition: A | Discharge status: Home / Self Care | Payer: Medicare Other | Attending: Emergency Medicine | Admitting: Emergency Medicine

## 2016-02-20 ENCOUNTER — Encounter (HOSPITAL_COMMUNITY): Payer: Self-pay | Admitting: Emergency Medicine

## 2016-02-20 DIAGNOSIS — Z79899 Other long term (current) drug therapy: Secondary | ICD-10-CM | POA: Diagnosis not present

## 2016-02-20 DIAGNOSIS — I10 Essential (primary) hypertension: Secondary | ICD-10-CM | POA: Insufficient documentation

## 2016-02-20 DIAGNOSIS — R10812 Left upper quadrant abdominal tenderness: Secondary | ICD-10-CM | POA: Diagnosis not present

## 2016-02-20 DIAGNOSIS — Z8546 Personal history of malignant neoplasm of prostate: Secondary | ICD-10-CM | POA: Diagnosis not present

## 2016-02-20 DIAGNOSIS — Z5321 Procedure and treatment not carried out due to patient leaving prior to being seen by health care provider: Secondary | ICD-10-CM | POA: Diagnosis not present

## 2016-02-20 DIAGNOSIS — K2970 Gastritis, unspecified, without bleeding: Secondary | ICD-10-CM | POA: Diagnosis not present

## 2016-02-20 DIAGNOSIS — R1084 Generalized abdominal pain: Secondary | ICD-10-CM | POA: Diagnosis not present

## 2016-02-20 LAB — CBC
HCT: 51.6 % (ref 39.0–52.0)
Hemoglobin: 17.6 g/dL — ABNORMAL HIGH (ref 13.0–17.0)
MCH: 29.3 pg (ref 26.0–34.0)
MCHC: 34.1 g/dL (ref 30.0–36.0)
MCV: 85.9 fL (ref 78.0–100.0)
PLATELETS: 213 10*3/uL (ref 150–400)
RBC: 6.01 MIL/uL — ABNORMAL HIGH (ref 4.22–5.81)
RDW: 12.9 % (ref 11.5–15.5)
WBC: 11.6 10*3/uL — ABNORMAL HIGH (ref 4.0–10.5)

## 2016-02-20 NOTE — ED Notes (Signed)
Patient continues to be irritated with current plan of care.  Patient raising his voice with RN after multiple attempts to redirect and asking the patient to lower his voice.  Patient is currently naked and refusing to get dressed or go to lobby.  Pt states "I could have stayed at home and hurt if you are going to make me wait out there".  This RN apologized and attempted to explain attempts already made for patient's care (blood, vitals, etc.).  Pt continued to refuse to go to lobby.  This RN asked if patient could at least get dressed, patient yelled stating he could not fit in his clothes.  This RN left room to obtain scrubs, call charge RN and security

## 2016-02-20 NOTE — ED Notes (Signed)
Attempted to provide patient with scrubs.  Patient fully dressed when RN came back.  Pt upset that security is present.  This RN attempted to explain security is always paged when a patient yells at staff.  Patient continues to raise his voice and is rude to security, this RN and other triage staff.  Charge RN spoke to pt and pt went to lobby.

## 2016-02-20 NOTE — ED Triage Notes (Signed)
Per EMS:  Pt presents to ED with mid and lower centralized abdominal pain that radiates to his left side and flank.  Pt tender to palpation in mid abdomen with some distention noted.  Pt sts historical norm for him.  Pt denies any nausea, vomiting, blood in stool/urine, difficulty with bowel movements or changes in urination.  Pt being short and frustrated with RN in triage.  Pt sts he wants a taxi called and does not want to be taken to the waiting room.  Pt unhappy that phlebotomist asked him to move his arm for blood draw.

## 2016-02-20 NOTE — ED Notes (Addendum)
Charge RN called to triage room for patient, per report from triage nurse patient was raising voice and refusing to comply with triage team requests. Pt upset that triage RN had called security, states that he isn't a trouble maker. States that he is hurting so bad that he cannot sit or move. He is upset that triage staff was asking him to move in order to triage him. Pt stating multiple times that he is not a trouble maker and does not need security to be called. Pt agreed that he raised his voice at triage staff but did so because he was hurting. This RN informed pt that staff had called security out of concern for their safety because he had raised his voice. Pt informed that he can be seen in department but he would have to be willing to wait in lobby to be seen. Pt states he's hurting too bad to wait and wants to go home. Offered wheelchair. Pt refused, escorted to front desk and assisted patient in calling a cab. This RN apologized multiple times throughout this exchange with patient for miscommunication and offered to escort patient to be seen by MD; pt insisting that he needs to go home and sit.

## 2016-02-21 LAB — COMPREHENSIVE METABOLIC PANEL
ALK PHOS: 86 U/L (ref 38–126)
ALT: 40 U/L (ref 17–63)
ANION GAP: 13 (ref 5–15)
AST: 29 U/L (ref 15–41)
Albumin: 4.2 g/dL (ref 3.5–5.0)
BILIRUBIN TOTAL: 0.7 mg/dL (ref 0.3–1.2)
BUN: 14 mg/dL (ref 6–20)
CALCIUM: 10 mg/dL (ref 8.9–10.3)
CHLORIDE: 101 mmol/L (ref 101–111)
CO2: 24 mmol/L (ref 22–32)
Creatinine, Ser: 1.12 mg/dL (ref 0.61–1.24)
GFR calc Af Amer: >60 mL/min (ref >60)
GLUCOSE: 245 mg/dL — AB (ref 65–99)
POTASSIUM: 4.2 mmol/L (ref 3.5–5.1)
Sodium: 138 mmol/L (ref 135–145)
Total Protein: 7.7 g/dL (ref 6.5–8.1)

## 2016-02-21 LAB — LIPASE, BLOOD: Lipase: 18 U/L (ref 11–51)

## 2016-02-21 NOTE — ED Notes (Addendum)
Pt called hospital back and requested to speak to administrator regarding his care tonight. Charge RN spoke to patient again and apologized for earlier event. Pt insists he is no trouble maker and staff had no right to call security on him. RN attempted to explain that staff had the right to call security if they did not feel safe. Pt verbally states that he did not feel that this was necessary and had concerns regarding professionalism of staff at triage. Explained procedure to patient again and apologized for confusion. Pt seemed content with this and apologized for raising his voice to staff. States that he will be glad to return if he needs to be seen again.

## 2016-03-13 DIAGNOSIS — L84 Corns and callosities: Secondary | ICD-10-CM | POA: Diagnosis not present

## 2016-03-13 DIAGNOSIS — I739 Peripheral vascular disease, unspecified: Secondary | ICD-10-CM | POA: Diagnosis not present

## 2016-03-13 DIAGNOSIS — L603 Nail dystrophy: Secondary | ICD-10-CM | POA: Diagnosis not present

## 2016-03-22 ENCOUNTER — Inpatient Hospital Stay (HOSPITAL_COMMUNITY): Payer: Medicare Other

## 2016-03-22 ENCOUNTER — Emergency Department (HOSPITAL_COMMUNITY): Payer: Medicare Other

## 2016-03-22 ENCOUNTER — Encounter (HOSPITAL_COMMUNITY): Payer: Self-pay

## 2016-03-22 ENCOUNTER — Inpatient Hospital Stay (HOSPITAL_COMMUNITY)
Admission: EM | Admit: 2016-03-22 | Discharge: 2016-03-26 | DRG: 563 | Disposition: A | Discharge status: Skilled Nursing Facility | Payer: Medicare Other | Attending: Orthopedic Surgery | Admitting: Orthopedic Surgery

## 2016-03-22 DIAGNOSIS — T148XXA Other injury of unspecified body region, initial encounter: Secondary | ICD-10-CM | POA: Diagnosis not present

## 2016-03-22 DIAGNOSIS — Z7984 Long term (current) use of oral hypoglycemic drugs: Secondary | ICD-10-CM | POA: Diagnosis not present

## 2016-03-22 DIAGNOSIS — Z8249 Family history of ischemic heart disease and other diseases of the circulatory system: Secondary | ICD-10-CM | POA: Diagnosis not present

## 2016-03-22 DIAGNOSIS — M109 Gout, unspecified: Secondary | ICD-10-CM | POA: Diagnosis present

## 2016-03-22 DIAGNOSIS — W000XXA Fall on same level due to ice and snow, initial encounter: Secondary | ICD-10-CM | POA: Diagnosis present

## 2016-03-22 DIAGNOSIS — S89101A Unspecified physeal fracture of lower end of right tibia, initial encounter for closed fracture: Secondary | ICD-10-CM | POA: Diagnosis not present

## 2016-03-22 DIAGNOSIS — Z833 Family history of diabetes mellitus: Secondary | ICD-10-CM | POA: Diagnosis not present

## 2016-03-22 DIAGNOSIS — S82246A Nondisplaced spiral fracture of shaft of unspecified tibia, initial encounter for closed fracture: Secondary | ICD-10-CM | POA: Diagnosis not present

## 2016-03-22 DIAGNOSIS — Z8546 Personal history of malignant neoplasm of prostate: Secondary | ICD-10-CM | POA: Diagnosis not present

## 2016-03-22 DIAGNOSIS — M6281 Muscle weakness (generalized): Secondary | ICD-10-CM | POA: Diagnosis not present

## 2016-03-22 DIAGNOSIS — F329 Major depressive disorder, single episode, unspecified: Secondary | ICD-10-CM | POA: Diagnosis present

## 2016-03-22 DIAGNOSIS — Z801 Family history of malignant neoplasm of trachea, bronchus and lung: Secondary | ICD-10-CM

## 2016-03-22 DIAGNOSIS — S89301A Unspecified physeal fracture of lower end of right fibula, initial encounter for closed fracture: Secondary | ICD-10-CM | POA: Diagnosis not present

## 2016-03-22 DIAGNOSIS — G8929 Other chronic pain: Secondary | ICD-10-CM | POA: Diagnosis not present

## 2016-03-22 DIAGNOSIS — K5900 Constipation, unspecified: Secondary | ICD-10-CM | POA: Diagnosis not present

## 2016-03-22 DIAGNOSIS — R51 Headache: Secondary | ICD-10-CM | POA: Diagnosis not present

## 2016-03-22 DIAGNOSIS — I10 Essential (primary) hypertension: Secondary | ICD-10-CM | POA: Diagnosis not present

## 2016-03-22 DIAGNOSIS — Z79899 Other long term (current) drug therapy: Secondary | ICD-10-CM

## 2016-03-22 DIAGNOSIS — Z01818 Encounter for other preprocedural examination: Secondary | ICD-10-CM | POA: Diagnosis not present

## 2016-03-22 DIAGNOSIS — S82391A Other fracture of lower end of right tibia, initial encounter for closed fracture: Secondary | ICD-10-CM | POA: Diagnosis not present

## 2016-03-22 DIAGNOSIS — S82244A Nondisplaced spiral fracture of shaft of right tibia, initial encounter for closed fracture: Secondary | ICD-10-CM | POA: Diagnosis not present

## 2016-03-22 DIAGNOSIS — H3530 Unspecified macular degeneration: Secondary | ICD-10-CM | POA: Diagnosis present

## 2016-03-22 DIAGNOSIS — Z9181 History of falling: Secondary | ICD-10-CM | POA: Diagnosis not present

## 2016-03-22 DIAGNOSIS — Z800 Family history of malignant neoplasm of digestive organs: Secondary | ICD-10-CM

## 2016-03-22 DIAGNOSIS — Z808 Family history of malignant neoplasm of other organs or systems: Secondary | ICD-10-CM

## 2016-03-22 DIAGNOSIS — S82244D Nondisplaced spiral fracture of shaft of right tibia, subsequent encounter for closed fracture with routine healing: Secondary | ICD-10-CM | POA: Diagnosis not present

## 2016-03-22 DIAGNOSIS — E119 Type 2 diabetes mellitus without complications: Secondary | ICD-10-CM | POA: Diagnosis not present

## 2016-03-22 DIAGNOSIS — M25571 Pain in right ankle and joints of right foot: Secondary | ICD-10-CM | POA: Diagnosis not present

## 2016-03-22 DIAGNOSIS — K219 Gastro-esophageal reflux disease without esophagitis: Secondary | ICD-10-CM | POA: Diagnosis present

## 2016-03-22 DIAGNOSIS — M1000 Idiopathic gout, unspecified site: Secondary | ICD-10-CM | POA: Diagnosis not present

## 2016-03-22 DIAGNOSIS — S82201A Unspecified fracture of shaft of right tibia, initial encounter for closed fracture: Secondary | ICD-10-CM | POA: Diagnosis present

## 2016-03-22 DIAGNOSIS — R278 Other lack of coordination: Secondary | ICD-10-CM | POA: Diagnosis not present

## 2016-03-22 DIAGNOSIS — R262 Difficulty in walking, not elsewhere classified: Secondary | ICD-10-CM

## 2016-03-22 DIAGNOSIS — R531 Weakness: Secondary | ICD-10-CM | POA: Diagnosis not present

## 2016-03-22 DIAGNOSIS — F411 Generalized anxiety disorder: Secondary | ICD-10-CM | POA: Diagnosis not present

## 2016-03-22 DIAGNOSIS — M79604 Pain in right leg: Secondary | ICD-10-CM | POA: Diagnosis not present

## 2016-03-22 DIAGNOSIS — S82831A Other fracture of upper and lower end of right fibula, initial encounter for closed fracture: Secondary | ICD-10-CM | POA: Diagnosis not present

## 2016-03-22 DIAGNOSIS — S82112A Displaced fracture of left tibial spine, initial encounter for closed fracture: Secondary | ICD-10-CM | POA: Diagnosis not present

## 2016-03-22 LAB — CBC WITH DIFFERENTIAL/PLATELET
BASOS ABS: 0 10*3/uL (ref 0.0–0.1)
Basophils Relative: 0 %
EOS PCT: 0 %
Eosinophils Absolute: 0 10*3/uL (ref 0.0–0.7)
HCT: 51.8 % (ref 39.0–52.0)
Hemoglobin: 17.9 g/dL — ABNORMAL HIGH (ref 13.0–17.0)
LYMPHS PCT: 13 %
Lymphs Abs: 2.2 10*3/uL (ref 0.7–4.0)
MCH: 29.4 pg (ref 26.0–34.0)
MCHC: 34.6 g/dL (ref 30.0–36.0)
MCV: 85.2 fL (ref 78.0–100.0)
MONO ABS: 0.6 10*3/uL (ref 0.1–1.0)
Monocytes Relative: 4 %
Neutro Abs: 13.9 10*3/uL — ABNORMAL HIGH (ref 1.7–7.7)
Neutrophils Relative %: 83 %
PLATELETS: 263 10*3/uL (ref 150–400)
RBC: 6.08 MIL/uL — ABNORMAL HIGH (ref 4.22–5.81)
RDW: 13.1 % (ref 11.5–15.5)
WBC: 16.8 10*3/uL — ABNORMAL HIGH (ref 4.0–10.5)

## 2016-03-22 LAB — BASIC METABOLIC PANEL
ANION GAP: 14 (ref 5–15)
BUN: 17 mg/dL (ref 6–20)
CALCIUM: 10 mg/dL (ref 8.9–10.3)
CO2: 22 mmol/L (ref 22–32)
CREATININE: 1.05 mg/dL (ref 0.61–1.24)
Chloride: 102 mmol/L (ref 101–111)
GFR calc Af Amer: >60 mL/min (ref >60)
GLUCOSE: 219 mg/dL — AB (ref 65–99)
Potassium: 4.3 mmol/L (ref 3.5–5.1)
Sodium: 138 mmol/L (ref 135–145)

## 2016-03-22 MED ORDER — METOPROLOL TARTRATE 50 MG PO TABS
50.0000 mg | ORAL_TABLET | Freq: Every day | ORAL | Status: DC
  Administered 2016-03-23 – 2016-03-26 (×4): 50 mg via ORAL
  Filled 2016-03-22 (×3): qty 1

## 2016-03-22 MED ORDER — INSULIN ASPART 100 UNIT/ML ~~LOC~~ SOLN
0.0000 [IU] | Freq: Three times a day (TID) | SUBCUTANEOUS | Status: DC
  Administered 2016-03-23 (×3): 3 [IU] via SUBCUTANEOUS
  Administered 2016-03-24: 2 [IU] via SUBCUTANEOUS
  Administered 2016-03-24 – 2016-03-25 (×3): 3 [IU] via SUBCUTANEOUS
  Administered 2016-03-25: 2 [IU] via SUBCUTANEOUS
  Administered 2016-03-25: 3 [IU] via SUBCUTANEOUS
  Administered 2016-03-26 (×2): 2 [IU] via SUBCUTANEOUS

## 2016-03-22 MED ORDER — ASPIRIN EC 325 MG PO TBEC
325.0000 mg | DELAYED_RELEASE_TABLET | Freq: Every day | ORAL | Status: DC
  Administered 2016-03-23 – 2016-03-26 (×4): 325 mg via ORAL
  Filled 2016-03-22 (×4): qty 1

## 2016-03-22 MED ORDER — METHOCARBAMOL 500 MG PO TABS
500.0000 mg | ORAL_TABLET | Freq: Four times a day (QID) | ORAL | Status: DC | PRN
  Administered 2016-03-22 – 2016-03-26 (×11): 500 mg via ORAL
  Filled 2016-03-22 (×11): qty 1

## 2016-03-22 MED ORDER — OXYCODONE HCL 5 MG PO TABS
5.0000 mg | ORAL_TABLET | ORAL | Status: DC | PRN
  Administered 2016-03-22 – 2016-03-26 (×19): 10 mg via ORAL
  Filled 2016-03-22 (×19): qty 2

## 2016-03-22 MED ORDER — SODIUM CHLORIDE 0.9 % IV SOLN
INTRAVENOUS | Status: DC
  Administered 2016-03-22: 22:00:00 via INTRAVENOUS

## 2016-03-22 MED ORDER — MORPHINE SULFATE (PF) 2 MG/ML IV SOLN
0.5000 mg | INTRAVENOUS | Status: DC | PRN
  Administered 2016-03-22 – 2016-03-24 (×9): 0.5 mg via INTRAVENOUS
  Filled 2016-03-22 (×9): qty 1

## 2016-03-22 MED ORDER — IRBESARTAN 300 MG PO TABS
300.0000 mg | ORAL_TABLET | Freq: Every day | ORAL | Status: DC
  Administered 2016-03-23 – 2016-03-26 (×4): 300 mg via ORAL
  Filled 2016-03-22 (×4): qty 1

## 2016-03-22 MED ORDER — METHOCARBAMOL 1000 MG/10ML IJ SOLN
500.0000 mg | Freq: Four times a day (QID) | INTRAVENOUS | Status: DC | PRN
  Filled 2016-03-22: qty 5

## 2016-03-22 MED ORDER — HYDROCHLOROTHIAZIDE 25 MG PO TABS
25.0000 mg | ORAL_TABLET | Freq: Every day | ORAL | Status: DC
  Administered 2016-03-23 – 2016-03-26 (×4): 25 mg via ORAL
  Filled 2016-03-22 (×4): qty 1

## 2016-03-22 MED ORDER — OLMESARTAN MEDOXOMIL-HCTZ 40-25 MG PO TABS: 1.0000 | ORAL_TABLET | Freq: Every day | ORAL | Status: DC

## 2016-03-22 MED ORDER — METFORMIN HCL 500 MG PO TABS
1000.0000 mg | ORAL_TABLET | Freq: Two times a day (BID) | ORAL | Status: DC
  Administered 2016-03-23 – 2016-03-26 (×6): 1000 mg via ORAL
  Filled 2016-03-22 (×6): qty 2

## 2016-03-22 MED ORDER — HYDROMORPHONE HCL 2 MG/ML IJ SOLN
0.5000 mg | Freq: Once | INTRAMUSCULAR | Status: AC
  Administered 2016-03-22: 0.5 mg via INTRAVENOUS
  Filled 2016-03-22: qty 1

## 2016-03-22 MED ORDER — HYDROMORPHONE HCL 2 MG/ML IJ SOLN
1.0000 mg | Freq: Once | INTRAMUSCULAR | Status: AC
  Administered 2016-03-22: 1 mg via INTRAVENOUS
  Filled 2016-03-22: qty 1

## 2016-03-22 NOTE — ED Triage Notes (Signed)
[facility]- pt slipped on ice injuring his right ankle. Pt reports severe pain. Some bruising and slight deformity noted. Pt unsure of LOC. Pt alert and oriented on arrival. 161mcg of fentanyl given PTA.

## 2016-03-22 NOTE — ED Provider Notes (Signed)
[facility] DEPT Provider Note   CSN: [id number] Arrival date & time: 03/22/16  1628     History   Chief Complaint Chief Complaint  Patient presents with  . Fall  . Ankle Injury    HPI [name] is a 61 y.o. male.  HPI Patient presents to the emergency department after ice today and reports severe pain in his right lower leg and ankle. He does report that he struck the back of his head without loss of consciousness. Patient denies neck pain. No chest pain shortness breath. Denies weakness of his arms or legs. She reports severe pain in his right lower leg is unable to stand or bear weight on his right leg. Pain is moderate to severe in severity at this time are worse with movement and palpation. He has a externally rotated   Past Medical History:  Diagnosis Date  . Depression   . GERD (gastroesophageal reflux disease)   . Hemorrhoids   . Hypertension   . Macular degeneration   . Microcytic anemia   . Prostate cancer ([place]) 08/2009  . Tubular adenoma     Patient Active Problem List   Diagnosis Date Noted  . Right tibial fracture 03/22/2016  . Microcytic anemia 02/17/2012  . Hemorrhoids 02/17/2012  . Prostate cancer ([place]) 02/17/2012  . 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 HTN (hypertension) 02/17/2012  . Depressive disorder 02/17/2012    Past Surgical History:  Procedure Laterality Date  . INGUINAL HERNIA REPAIR    . Left ankle joint fusion  1981  . PROSTATE BIOPSY     x 2       Home Medications    Prior to Admission medications   Medication Sig Start Date End Date Taking? Authorizing Provider  acetaminophen (TYLENOL) 325 MG tablet Take 325 mg by mouth every 6 (six) hours as needed.    Historical Provider, MD  ALPRAZolam Duanne Moron) 1 MG tablet Take 1 mg by mouth at bedtime as needed.  01/17/12   Historical Provider, MD  BENICAR HCT 40-25 MG per tablet Take 1 tablet by mouth every morning.  12/30/11   Historical Provider, MD  divalproex (DEPAKOTE) 500 MG  DR tablet Take 500 mg by mouth 3 (three) times daily.     Historical Provider, MD  ferrous gluconate (FERGON) 325 MG tablet Take 325 mg by mouth 2 (two) times daily.    Historical Provider, MD  HYDROcodone-acetaminophen (NORCO/VICODIN) 5-325 MG per tablet Take 1-2 tablets by mouth every 6 (six) hours as needed for moderate pain. 08/11/13   Rolland Porter, MD  HYDROcodone-acetaminophen (NORCO/VICODIN) 5-325 MG per tablet Take 1 tablet by mouth every 6 (six) hours as needed for moderate pain (gets # 80 monthly from Dr Ezequiel Essex).    Historical Provider, MD  metoprolol (LOPRESSOR) 50 MG tablet Take 50 mg by mouth daily.    Historical Provider, MD  penicillin v potassium (VEETID) 500 MG tablet Take 1 tablet (500 mg total) by mouth 4 (four) times daily. 08/11/13   Rolland Porter, MD    Family History Family History  Problem Relation Age of Onset  . Lung cancer Mother     Deceased  . Throat cancer Brother   . Pancreatic cancer Father   . Heart disease Father     Deceased  . Lung cancer Maternal Uncle     nephew  . Diabetes Other     Social History Social History  Substance Use Topics  . Smoking status: Never Smoker  . Smokeless  tobacco: Never Used  . Alcohol use Yes     Comment: Occasional     Allergies   Patient has no known allergies.   Review of Systems Review of Systems  All other systems reviewed and are negative.    Physical Exam Updated Vital Signs BP 156/99   Pulse 93   Temp 97.6 F (36.4 C) (Oral)   Resp 16   SpO2 93%   Physical Exam  Constitutional: He is oriented to person, place, and time. He appears well-developed and well-nourished.  HENT:  Head: Normocephalic and atraumatic.  Eyes: EOM are normal. Pupils are equal, round, and reactive to light.  Neck: Normal range of motion.  C-spine nontender.  Cardiovascular: Normal rate.   Pulmonary/Chest: Effort normal.  Abdominal: He exhibits no distension.  Musculoskeletal:  Pain with range of motion of the right ankle  and top old deformity of the right distal tibia. Normal pulses the right foot. Compartments of the right lower extremity are soft. Full range of motion of her knee and right hip.  Neurological: He is alert and oriented to person, place, and time.  Psychiatric: He has a normal mood and affect.  Nursing note and vitals reviewed.    ED Treatments / Results  Labs (all labs ordered are listed, but only abnormal results are displayed) Labs Reviewed  CBC WITH DIFFERENTIAL/PLATELET  BASIC METABOLIC PANEL    EKG  EKG Interpretation  Date/Time:  Friday March 22 2016 19:28:01 EST Ventricular Rate:  96 PR Interval:    QRS Duration: 112 QT Interval:  265 QTC Calculation: 335 R Axis:   -6 Text Interpretation:  Sinus rhythm Inferior infarct, age indeterminate Lateral leads are also involved Baseline wander in lead(s) V2 V3 V4 V5 V6 No significant change was found Confirmed by Coriann Brouhard  MD, Lennette Bihari ([id number]) on 03/22/2016 8:12:19 PM       Radiology Dg Tibia/fibula Right  Result Date: 03/22/2016 CLINICAL DATA:  Right lower leg injury and pain due to a slip and fall on ice today. Initial encounter. EXAM: RIGHT TIBIA AND FIBULA - 2 VIEW COMPARISON:  None. FINDINGS: The patient has a spiral fracture of the distal diaphysis of the right fibula with slight lateral displacement of the distal fragment. Oblique fracture of the distal diaphysis of the fibula shows slight posterior displacement of the distal fragment. No other acute bony or joint abnormality. Atherosclerosis noted. IMPRESSION: Acute distal diaphyseal fractures of the tibia and fibula. Electronically Signed   By: Inge Rise M.D.   On: 03/22/2016 18:43   Dg Ankle Complete Right  Result Date: 03/22/2016 CLINICAL DATA:  Right ankle pain due to an injury secondary to a slip and fall on ice today. Initial encounter. EXAM: RIGHT ANKLE - COMPLETE 3+ VIEW COMPARISON:  None. FINDINGS: The patient has a spiral fracture through the distal diaphysis of  the tibia. The fracture shows slight lateral displacement of the distal fragment. There is also an oblique fracture of the distal diaphysis of the fibula with slight posterior displacement of the distal fragment. IMPRESSION: Acute distal diaphyseal fractures of the right tibia and fibula as above. Electronically Signed   By: Inge Rise M.D.   On: 03/22/2016 18:41   Ct Head Wo Contrast  Result Date: 03/22/2016 CLINICAL DATA:  [place] [address] and hit head today.  Headache. EXAM: CT HEAD WITHOUT CONTRAST TECHNIQUE: Contiguous axial images were obtained from the base of the skull through the vertex without intravenous contrast. COMPARISON:  CT head 04/18/2008 FINDINGS: Brain: Moderate atrophy  with progression. Chronic microvascular ischemic changes throughout the white matter with progression. Negative for acute infarct.  Negative for acute hemorrhage or mass. Vascular: No hyperdense vessel or unexpected calcification. Skull: Negative for fracture. Sinuses/Orbits: Negative Other: None IMPRESSION: No acute intracranial abnormality Atrophy and chronic microvascular ischemia change have progressed since 2010. Electronically Signed   By: Franchot Gallo M.D.   On: 03/22/2016 18:19    Procedures Procedures (including critical care time)   ++++++++++++++++++++++++++++++++++++++++++  SPLINT APPLICATION Authorized by: Hoy Morn Consent: Verbal consent obtained. Risks and benefits: risks, benefits and alternatives were discussed Consent given by: patient Splint applied by: orthopedic technician Location details: right lower leg Splint type: Short leg with stirrup  Supplies used: Ortho-Glass  Post-procedure: The splinted body part was neurovascularly unchanged following the procedure. Patient tolerance: Patient tolerated the procedure well with no immediate complications.  ++++++++++++++++++++++++++++++++++++++++++++++++    Medications Ordered in ED Medications  HYDROmorphone (DILAUDID) injection 1 mg  (1 mg Intravenous Given 03/22/16 1722)  HYDROmorphone (DILAUDID) injection 0.5 mg (0.5 mg Intravenous Given 03/22/16 2001)     Initial Impression / Assessment and Plan / ED Course  I have reviewed the triage vital signs and the nursing notes.  Pertinent labs & imaging results that were available during my care of the patient were reviewed by me and considered in my medical decision making (see chart for details).  Clinical Course     I spoke with [name] regarding the patient's fracture independent fact patient has no transportation and lives upstairs to be admitted to the hospital overnight for plan for operative care tomorrow. Splinted. No other injury. Pain treated. Preoperative labs obtained.  Final Clinical Impressions(s) / ED Diagnoses   Final diagnoses:  Nondisplaced spiral fracture of shaft of unspecified tibia, initial encounter for closed fracture    New Prescriptions New Prescriptions   No medications on file     Jola Schmidt, MD 03/22/16 2013

## 2016-03-22 NOTE — Progress Notes (Signed)
Orthopedic Tech Progress Note Patient Details:  [name] 12/03/1955 [id number]  Ortho Devices Type of Ortho Device: Short leg splint Ortho Device/Splint Location: Applied Short leg Splint to pt Right Leg/Ankle.  Doctors Assisted.  Doctor stated pt will be admitted and no longer needs crutches.  pt is scheduled for admission and surgery.  Pt tolerated well after application of splint.  Ortho Device/Splint Interventions: Application   Kristopher Oppenheim 03/22/2016, 8:13 PM

## 2016-03-23 ENCOUNTER — Encounter (HOSPITAL_COMMUNITY): Payer: Self-pay

## 2016-03-23 DIAGNOSIS — S82244A Nondisplaced spiral fracture of shaft of right tibia, initial encounter for closed fracture: Secondary | ICD-10-CM

## 2016-03-23 DIAGNOSIS — S82246A Nondisplaced spiral fracture of shaft of unspecified tibia, initial encounter for closed fracture: Secondary | ICD-10-CM

## 2016-03-23 LAB — GLUCOSE, CAPILLARY
Glucose-Capillary: 189 mg/dL — ABNORMAL HIGH (ref 65–99)
Glucose-Capillary: 183 mg/dL — ABNORMAL HIGH (ref 65–99)
Glucose-Capillary: 156 mg/dL — ABNORMAL HIGH (ref 65–99)
Glucose-Capillary: 185 mg/dL — ABNORMAL HIGH (ref 65–99)

## 2016-03-23 NOTE — H&P (Signed)
[name] is an 61 y.o. male.   Chief Complaint: Right leg pain HPI: Patient is a 61 year old gentleman who had a ground-level fall when he slipped on ice sustaining a twisting spiral fracture to the right distal tibia  Past Medical History:  Diagnosis Date  . Depression   . GERD (gastroesophageal reflux disease)   . Hemorrhoids   . Hypertension   . Macular degeneration   . Microcytic anemia   . Prostate cancer ([place]) 08/2009  . Tubular adenoma     Past Surgical History:  Procedure Laterality Date  . INGUINAL HERNIA REPAIR    . Left ankle joint fusion  1981  . PROSTATE BIOPSY     x 2    Family History  Problem Relation Age of Onset  . Lung cancer Mother     Deceased  . Throat cancer Brother   . Pancreatic cancer Father   . Heart disease Father     Deceased  . Lung cancer Maternal Uncle     nephew  . Diabetes Other    Social History:  reports that he has never smoked. He has never used smokeless tobacco. He reports that he drinks alcohol. He reports that he does not use drugs.  Allergies: No Known Allergies  Medications Prior to Admission  Medication Sig Dispense Refill  . metFORMIN (GLUCOPHAGE) 1000 MG tablet Take 1,000 mg by mouth 2 (two) times daily with a meal.    . metoprolol (LOPRESSOR) 50 MG tablet Take 50 mg by mouth daily.    Marland Kitchen olmesartan-hydrochlorothiazide (BENICAR HCT) 40-25 MG tablet Take 1 tablet by mouth daily.    Marland Kitchen HYDROcodone-acetaminophen (NORCO/VICODIN) 5-325 MG per tablet Take 1-2 tablets by mouth every 6 (six) hours as needed for moderate pain. (Patient not taking: Reported on 03/22/2016) 12 tablet 0    Results for orders placed or performed during the hospital encounter of 03/22/16 (from the past 48 hour(s))  CBC with Differential/Platelet     Status: Abnormal   Collection Time: 03/22/16  7:05 PM  Result Value Ref Range   WBC 16.8 (H) 4.0 - 10.5 K/uL   RBC 6.08 (H) 4.22 - 5.81 MIL/uL   Hemoglobin 17.9 (H) 13.0 - 17.0 g/dL   HCT 51.8 39.0 -  52.0 %   MCV 85.2 78.0 - 100.0 fL   MCH 29.4 26.0 - 34.0 pg   MCHC 34.6 30.0 - 36.0 g/dL   RDW 13.1 11.5 - 15.5 %   Platelets 263 150 - 400 K/uL   Neutrophils Relative % 83 %   Neutro Abs 13.9 (H) 1.7 - 7.7 K/uL   Lymphocytes Relative 13 %   Lymphs Abs 2.2 0.7 - 4.0 K/uL   Monocytes Relative 4 %   Monocytes Absolute 0.6 0.1 - 1.0 K/uL   Eosinophils Relative 0 %   Eosinophils Absolute 0.0 0.0 - 0.7 K/uL   Basophils Relative 0 %   Basophils Absolute 0.0 0.0 - 0.1 K/uL  Basic metabolic panel     Status: Abnormal   Collection Time: 03/22/16  7:05 PM  Result Value Ref Range   Sodium 138 135 - 145 mmol/L   Potassium 4.3 3.5 - 5.1 mmol/L   Chloride 102 101 - 111 mmol/L   CO2 22 22 - 32 mmol/L   Glucose, Bld 219 (H) 65 - 99 mg/dL   BUN 17 6 - 20 mg/dL   Creatinine, Ser 1.05 0.61 - 1.24 mg/dL   Calcium 10.0 8.9 - 10.3 mg/dL   GFR  calc non Af Amer >60 >60 mL/min   GFR calc Af Amer >60 >60 mL/min    Comment: (NOTE) The eGFR has been calculated using the CKD EPI equation. This calculation has not been validated in all clinical situations. eGFR's persistently <60 mL/min signify possible Chronic Kidney Disease.    Anion gap 14 5 - 15   Dg Chest 2 View  Result Date: 03/22/2016 CLINICAL DATA:  61 year old male under preoperative evaluation prior to tibial surgery. EXAM: CHEST  2 VIEW COMPARISON:  Chest x-ray 04/18/2008. FINDINGS: Lung volumes are normal. No consolidative airspace disease. No pleural effusions. No pneumothorax. No pulmonary nodule or mass noted. Pulmonary vasculature and the cardiomediastinal silhouette are within normal limits. IMPRESSION: No radiographic evidence of acute cardiopulmonary disease. Electronically Signed   By: Vinnie Langton M.D.   On: 03/22/2016 21:14   Dg Tibia/fibula Right  Result Date: 03/22/2016 CLINICAL DATA:  Right lower leg injury and pain due to a slip and fall on ice today. Initial encounter. EXAM: RIGHT TIBIA AND FIBULA - 2 VIEW COMPARISON:  None.  FINDINGS: The patient has a spiral fracture of the distal diaphysis of the right fibula with slight lateral displacement of the distal fragment. Oblique fracture of the distal diaphysis of the fibula shows slight posterior displacement of the distal fragment. No other acute bony or joint abnormality. Atherosclerosis noted. IMPRESSION: Acute distal diaphyseal fractures of the tibia and fibula. Electronically Signed   By: Inge Rise M.D.   On: 03/22/2016 18:43   Dg Ankle Complete Right  Result Date: 03/22/2016 CLINICAL DATA:  Right ankle pain due to an injury secondary to a slip and fall on ice today. Initial encounter. EXAM: RIGHT ANKLE - COMPLETE 3+ VIEW COMPARISON:  None. FINDINGS: The patient has a spiral fracture through the distal diaphysis of the tibia. The fracture shows slight lateral displacement of the distal fragment. There is also an oblique fracture of the distal diaphysis of the fibula with slight posterior displacement of the distal fragment. IMPRESSION: Acute distal diaphyseal fractures of the right tibia and fibula as above. Electronically Signed   By: Inge Rise M.D.   On: 03/22/2016 18:41   Ct Head Wo Contrast  Result Date: 03/22/2016 CLINICAL DATA:  [place] [address] and hit head today.  Headache. EXAM: CT HEAD WITHOUT CONTRAST TECHNIQUE: Contiguous axial images were obtained from the base of the skull through the vertex without intravenous contrast. COMPARISON:  CT head 04/18/2008 FINDINGS: Brain: Moderate atrophy with progression. Chronic microvascular ischemic changes throughout the white matter with progression. Negative for acute infarct.  Negative for acute hemorrhage or mass. Vascular: No hyperdense vessel or unexpected calcification. Skull: Negative for fracture. Sinuses/Orbits: Negative Other: None IMPRESSION: No acute intracranial abnormality Atrophy and chronic microvascular ischemia change have progressed since 2010. Electronically Signed   By: Franchot Gallo M.D.   On: 03/22/2016  18:19    Review of Systems  All other systems reviewed and are negative.   Blood pressure 133/66, pulse 83, temperature 98.1 F (36.7 C), temperature source Oral, resp. rate 18, SpO2 90 %. Physical Exam  On examination patient is alert oriented no adenopathy well-dressed normal affect normal respiratory effort he cannot weight-bear on the right leg. There is no open wound. No open fracture. Foot is neurovascularly intact. Radiographs shows a nondisplaced spiral fracture at the junction the middle and distal third right tibia. Alignment is straight in both AP and lateral planes with no varus or valgus malalignment. Assessment/Plan Assessment: Nondisplaced spiral right distal tibia fracture.  Plan: Discussed with the patient that we can proceed with nonoperative treatment. Will have patient work with physical therapy with nonweightbearing on the right. Patient may require internal fixation if displacement develops.  Newt Minion, MD 03/23/2016, 7:36 AM

## 2016-03-23 NOTE — Evaluation (Signed)
Physical Therapy Evaluation Patient Details Name: [name] MRN: [id number] DOB: 08/07/1955 Today's Date: 03/23/2016   History of Present Illness  61 yo male admitted on 03/22/16 following a fall on the ice resulting in a right tibial fracture. Surgeon has decided not to proceed with a surgical repair. PMH significant for GERD, Depression, HTN, Macular degeneration, Prostate CA.  Clinical Impression  Pt presents with the above diagnosis for PT evaluation. Prior to admission, pt was living alone and was completely independent with all ADLs and IADLs. Pt is agitated and impulsive this session requiring increased cues and reminders to wait for clinician before trying to move. Pt wants to stand up at chair and informed him that he cannot stand without assistance at this time due to his high risk for falls. Pt will benefit from being seen acutely in order to address below deficits and will benefit from SNF placement due to high risk for falls and to maintain NWB on RLE.     Follow Up Recommendations SNF    Equipment Recommendations  None recommended by PT;Other (comment) (defer to next venue)    Recommendations for Other Services       Precautions / Restrictions Precautions Precautions: Fall Precaution Comments: Pt fell prior to surgery and does not listen well to instructions and is impulsive.   Restrictions Weight Bearing Restrictions: Yes RLE Weight Bearing: Non weight bearing      Mobility  Bed Mobility Overal bed mobility: Needs Assistance Bed Mobility: Supine to Sit     Supine to sit: HOB elevated;Min assist     General bed mobility comments: relies on railings and requires Min A to bring RLE EOB  Transfers Overall transfer level: Needs assistance Equipment used: Rolling walker (2 wheeled) Transfers: Sit to/from Stand Sit to Stand: Min assist         General transfer comment: Min A from EOB.   Ambulation/Gait Ambulation/Gait assistance: Min assist Ambulation  Distance (Feet): 15 Feet Assistive device: Rolling walker (2 wheeled) Gait Pattern/deviations: Step-to pattern Gait velocity: decreased Gait velocity interpretation: Below normal speed for age/gender General Gait Details: hop to pattern on RLE. Pt performs short distance gait to chair, stands for about 1 minute and then performs gait to bathroom and back to EOB with cues for sequencing.   Stairs            Wheelchair Mobility    Modified Rankin (Stroke Patients Only)       Balance Overall balance assessment: Needs assistance Sitting-balance support: No upper extremity supported;Feet supported Sitting balance-Leahy Scale: Good Sitting balance - Comments: sitting EOB with no back support   Standing balance support: Bilateral upper extremity supported Standing balance-Leahy Scale: Poor Standing balance comment: relies on Rw for stability in standing                             Pertinent Vitals/Pain Pain Assessment: 0-10 Pain Score: 8  Pain Location: right ankle Pain Descriptors / Indicators: Sharp Pain Intervention(s): Monitored during session;Repositioned    Home Living Family/patient expects to be discharged to:: Skilled nursing facility Living Arrangements: Alone                    Prior Function Level of Independence: Independent         Comments: was completely independent, not working at this time.      Hand Dominance   Dominant Hand: Right    Extremity/Trunk Assessment  Upper Extremity Assessment Upper Extremity Assessment: Overall WFL for tasks assessed    Lower Extremity Assessment Lower Extremity Assessment: RLE deficits/detail RLE Deficits / Details: grossly 3/5 knee and hip.  RLE: Unable to fully assess due to immobilization       Communication   Communication: No difficulties  Cognition Arousal/Alertness: Awake/alert Behavior During Therapy: Agitated;Impulsive Overall Cognitive Status: Within Functional Limits for  tasks assessed                 General Comments: pt is agitated and moves before clinician can give instructions.     General Comments General comments (skin integrity, edema, etc.): pt is impulsive and moves at his own pace regardless of instructions. Pt moved before clincian could maneuver IV and popped off cap of IV. Rn notified. Pt does not want to sit initially and insists on standing up.     Exercises     Assessment/Plan    PT Assessment Patient needs continued PT services  PT Problem List Decreased strength;Decreased activity tolerance;Decreased balance;Decreased mobility;Decreased knowledge of use of DME          PT Treatment Interventions DME instruction;Gait training;Functional mobility training;Therapeutic activities;Therapeutic exercise;Balance training    PT Goals (Current goals can be found in the Care Plan section)  Acute Rehab PT Goals Patient Stated Goal: to stand up without help PT Goal Formulation: With patient Time For Goal Achievement: 03/30/16 Potential to Achieve Goals: Good    Frequency Min 3X/week   Barriers to discharge        Co-evaluation               End of Session Equipment Utilized During Treatment: Gait belt Activity Tolerance: Patient tolerated treatment well Patient left: in bed;with call bell/phone within reach;with bed alarm set Nurse Communication: Mobility status         Time: [id number] PT Time Calculation (min) (ACUTE ONLY): 37 min   Charges:   PT Evaluation $PT Eval Moderate Complexity: 1 Procedure PT Treatments $Gait Training: 8-22 mins   PT G Codes:        Scheryl Marten PT, DPT  [phone]  03/23/2016, 4:49 PM

## 2016-03-23 NOTE — Progress Notes (Signed)
Patient ID: [name], male   DOB: 10/31/1955, 61 y.o.   MRN: [id number] Patient with difficulty with physical therapy. Patient will need discharge to skilled nursing in order to maintain nonweightbearing on the right lower extremity. Discussed that with weightbearing or with falling patient could displace the fracture and require surgical intervention. With patient's diabetes he is an increased risk of complications with surgery.

## 2016-03-24 LAB — GLUCOSE, CAPILLARY
Glucose-Capillary: 156 mg/dL — ABNORMAL HIGH (ref 65–99)
Glucose-Capillary: 155 mg/dL — ABNORMAL HIGH (ref 65–99)
Glucose-Capillary: 145 mg/dL — ABNORMAL HIGH (ref 65–99)
Glucose-Capillary: 131 mg/dL — ABNORMAL HIGH (ref 65–99)

## 2016-03-24 LAB — HEMOGLOBIN A1C
HEMOGLOBIN A1C: 8.2 % — AB (ref 4.8–5.6)
Hgb A1c MFr Bld: 8.2 % — ABNORMAL HIGH (ref 4.8–5.6)
MEAN PLASMA GLUCOSE: 189 mg/dL
Mean Plasma Glucose: 189 mg/dL

## 2016-03-24 MED ORDER — BISACODYL 10 MG RE SUPP
10.0000 mg | Freq: Every day | RECTAL | Status: DC | PRN
  Administered 2016-03-24 – 2016-03-25 (×2): 10 mg via RECTAL
  Filled 2016-03-24 (×2): qty 1

## 2016-03-24 MED ORDER — SENNOSIDES-DOCUSATE SODIUM 8.6-50 MG PO TABS
1.0000 | ORAL_TABLET | Freq: Every evening | ORAL | Status: DC | PRN
  Administered 2016-03-24: 1 via ORAL
  Filled 2016-03-24: qty 1

## 2016-03-24 MED ORDER — POLYETHYLENE GLYCOL 3350 17 G PO PACK: 17.0000 g | PACK | Freq: Every day | ORAL | Status: DC | PRN

## 2016-03-24 MED ORDER — DOCUSATE SODIUM 100 MG PO CAPS
100.0000 mg | ORAL_CAPSULE | Freq: Two times a day (BID) | ORAL | Status: DC
  Administered 2016-03-24 – 2016-03-26 (×5): 100 mg via ORAL
  Filled 2016-03-24 (×5): qty 1

## 2016-03-24 NOTE — Clinical Social Work Note (Signed)
Clinical Social Work Assessment  Patient Details  Name: [name] MRN: [id number] Date of Birth: Mar 11, 1956  Date of referral:  03/24/16               Reason for consult:  Facility Placement                Permission sought to share information with:  Family Supports Permission granted to share information::  Yes, Verbal Permission Granted  Name::     Shona Simpson  Relationship::  sister  Contact Information:  [phone]  Housing/Transportation Living arrangements for the past 2 months:  Apartment Source of Information:  Patient Patient Interpreter Needed:  None Criminal Activity/Legal Involvement Pertinent to Current Situation/Hospitalization:  No - Comment as needed Significant Relationships:  Siblings Lives with:  Self Do you feel safe going back to the place where you live?  Yes Need for family participation in patient care:  Yes (Comment)  Care giving concerns:  No family/friends at bedside.  Patient does not express concerns on family behalf at this time.   Social Worker assessment / plan:  Holiday representative met with patient at bedside to offer support and discuss patient needs at discharge.  Patient states that he lives at home alone in a second story apartment.  Patient has not had a previous stay in rehab, but is agreeable.  CSW to initiate SNF search and follow up with patient regarding available bed offers.  Patient plans to update family and friends of discharge plans.  Patient plans to transport by ambulance to facility at time of discharge.  CSW remains available for support and to facilitate patient discharge needs.  Employment status:  Disabled (Comment on whether or not currently receiving Disability) Insurance information:  Medicare, Medicaid In [place] PT Recommendations:  [place] / Referral to community resources:  [place]  Patient/Family's Response to care:  Patient verbalized understanding of CSW role and  appreciation of support and involvement.  Patient is agreeable with ST-SNF placement and is open to facilities - would like to discuss bed offers with MD prior to making a decision.  Patient/Family's Understanding of and Emotional Response to Diagnosis, Current Treatment, and Prognosis:  Patient understanding of injury and limitations with return home at discharge.  Patient with good optimism and room for continued progress.    Emotional Assessment Appearance:  Appears older than stated age Attitude/Demeanor/Rapport:  Inconsistent, Guarded Affect (typically observed):  Accepting, Guarded, Calm Orientation:  Oriented to Self, Oriented to Situation, Oriented to Place, Oriented to  Time Alcohol / Substance use:  Not Applicable Psych involvement (Current and /or in the community):  No (Comment)  Discharge Needs  Concerns to be addressed:  Discharge Planning Concerns Readmission within the last 30 days:  No Current discharge risk:  Dependent with Mobility Barriers to Discharge:  Continued Medical Work up  [employer], [place]

## 2016-03-24 NOTE — Progress Notes (Signed)
Patient ID: [name], male   DOB: 07/01/1955, 61 y.o.   MRN: [id number] Patient is not safe with physical therapy will require discharge to skilled nursing. Anticipate possible discharge on Monday to skilled nursing.

## 2016-03-24 NOTE — Clinical Social Work Placement (Addendum)
   CLINICAL SOCIAL WORK PLACEMENT  NOTE  Date:  03/24/2016  Patient Details  Name: [name] MRN: [id number] Date of Birth: 10-Jul-1955  Clinical Social Work is seeking post-discharge placement for this patient at the [place] level of care (*CSW will initial, date and re-position this form in  chart as items are completed):  Yes   Patient/family provided with [place] Work Department's list of facilities offering this level of care within the geographic area requested by the patient (or if unable, by the patient's family).  Yes   Patient/family informed of their freedom to choose among providers that offer the needed level of care, that participate in Medicare, Medicaid or managed care program needed by the patient, have an available bed and are willing to accept the patient.  Yes   Patient/family informed of [place]'s ownership interest in [facility] and [facility], as well as of the fact that they are under no obligation to receive care at these facilities.  PASRR submitted to EDS on       PASRR number received on       Existing PASRR number confirmed on 03/24/16     FL2 transmitted to all facilities in geographic area requested by pt/family on 03/24/16     FL2 transmitted to all facilities within larger geographic area on       Patient informed that his/her managed care company has contracts with or will negotiate with certain facilities, including the following:            Patient/family informed of bed offers received.  Patient [facility] bed [facility]     Physician recommends and patient chooses bed at      Patient to be transferred to [facility] on 03/26/16.  Patient to be transferred to facility by [facility]     Patient family notified on 03/26/16 of transfer.  Name of family member notified:        PHYSICIAN Please sign FL2     Additional Comment:   Barbette Or, [place],  [place]

## 2016-03-24 NOTE — NC FL2 (Signed)
[place] LEVEL OF CARE SCREENING TOOL     IDENTIFICATION  Patient Name: [name] Birthdate: 10-30-1955 Sex: male Admission Date (Current Location): 03/22/2016  [facility] and [place] Number:  Herbalist and Address:  [facility], [place] [address] [place], [place] [address]      Provider Number: [id number]  Attending Physician Name and Address:  Newt Minion, MD  Relative Name and Phone Number:       Current Level of Care: Hospital Recommended Level of Care: [place] Prior Approval Number:    Date Approved/Denied:   PASRR Number: [id number]  Discharge Plan: SNF    Current Diagnoses: Patient Active Problem List   Diagnosis Date Noted  . Nondisplaced spiral fracture of shaft of unspecified tibia, initial encounter for closed fracture   . Right tibial fracture 03/22/2016  . Microcytic anemia 02/17/2012  . Hemorrhoids 02/17/2012  . Prostate cancer ([place]) 02/17/2012  . 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 HTN (hypertension) 02/17/2012  . Depressive disorder 02/17/2012    Orientation RESPIRATION BLADDER Height & Weight     Self, Time, Situation, Place  Normal Continent Weight:   Height:     BEHAVIORAL SYMPTOMS/MOOD NEUROLOGICAL BOWEL NUTRITION STATUS      Continent Diet (Carb Modified / Thin Liquid)  AMBULATORY STATUS COMMUNICATION OF NEEDS Skin   Limited Assist Verbally Normal                       Personal Care Assistance Level of Assistance  Bathing, Feeding, Dressing Bathing Assistance: Limited assistance Feeding assistance: Independent Dressing Assistance: Limited assistance     Functional Limitations Info  Sight, Hearing, Speech Sight Info: Adequate Hearing Info: Adequate Speech Info: Adequate    SPECIAL CARE FACTORS FREQUENCY  PT (By licensed PT), OT (By licensed OT)     PT Frequency: 3x week OT Frequency: 3x week            Contractures Contractures Info: Not present     Additional Factors Info  Code Status, Allergies, Insulin Sliding Scale Code Status Info: Full Code Allergies Info: No Known Allergies   Insulin Sliding Scale Info: Novolog 3x times daily with meals       Current Medications (03/24/2016):  This is the current hospital active medication list Current Facility-Administered Medications  Medication Dose Route Frequency Provider Last Rate Last Dose  . 0.9 %  sodium chloride infusion   Intravenous Continuous Newt Minion, MD 10 mL/hr at 03/22/16 2226    . aspirin EC tablet 325 mg  325 mg Oral Daily Newt Minion, MD   325 mg at 03/24/16 0805  . bisacodyl (DULCOLAX) suppository 10 mg  10 mg Rectal Daily PRN Newt Minion, MD      . docusate sodium (COLACE) capsule 100 mg  100 mg Oral BID Newt Minion, MD      . irbesartan (AVAPRO) tablet 300 mg  300 mg Oral Daily Newt Minion, MD   300 mg at 03/24/16 0804   And  . hydrochlorothiazide (HYDRODIURIL) tablet 25 mg  25 mg Oral Daily Newt Minion, MD   25 mg at 03/24/16 0804  . insulin aspart (novoLOG) injection 0-15 Units  0-15 Units Subcutaneous TID WC Newt Minion, MD   3 Units at 03/24/16 [phone]  . metFORMIN (GLUCOPHAGE) tablet 1,000 mg  1,000 mg Oral BID WC Newt Minion, MD   1,000 mg at 03/24/16  0805  . methocarbamol (ROBAXIN) tablet 500 mg  500 mg Oral Q6H PRN Newt Minion, MD   500 mg at 03/24/16 [id number]   Or  . methocarbamol (ROBAXIN) 500 mg in dextrose 5 % 50 mL IVPB  500 mg Intravenous Q6H PRN Newt Minion, MD      . metoprolol (LOPRESSOR) tablet 50 mg  50 mg Oral Daily Newt Minion, MD   50 mg at 03/24/16 0804  . morphine 2 MG/ML injection 0.5 mg  0.5 mg Intravenous Q2H PRN Newt Minion, MD   0.5 mg at 03/23/16 2004  . oxyCODONE (Oxy IR/ROXICODONE) immediate release tablet 5-10 mg  5-10 mg Oral Q4H PRN Newt Minion, MD   10 mg at 03/24/16 1020  . polyethylene glycol (MIRALAX / GLYCOLAX) packet 17 g  17 g Oral Daily PRN Newt Minion, MD      . senna-docusate (Senokot-S) tablet 1  tablet  1 tablet Oral QHS PRN Newt Minion, MD         Discharge Medications: Please see discharge summary for a list of discharge medications.  Relevant Imaging Results:  Relevant Lab Results:   Additional Information SSN [id number]   Barbette Or, [place]

## 2016-03-25 LAB — GLUCOSE, CAPILLARY
GLUCOSE-CAPILLARY: 182 mg/dL — AB (ref 65–99)
Glucose-Capillary: 151 mg/dL — ABNORMAL HIGH (ref 65–99)
Glucose-Capillary: 141 mg/dL — ABNORMAL HIGH (ref 65–99)
Glucose-Capillary: 140 mg/dL — ABNORMAL HIGH (ref 65–99)

## 2016-03-25 LAB — URIC ACID: Uric Acid, Serum: 8 mg/dL — ABNORMAL HIGH (ref 4.4–7.6)

## 2016-03-25 LAB — SURGICAL PCR SCREEN
MRSA, PCR: NEGATIVE
STAPHYLOCOCCUS AUREUS: NEGATIVE

## 2016-03-25 MED ORDER — ASPIRIN 325 MG PO TBEC: 325.0000 mg | DELAYED_RELEASE_TABLET | Freq: Every day | ORAL | 0 refills | Status: DC

## 2016-03-25 MED ORDER — OXYCODONE-ACETAMINOPHEN 5-325 MG PO TABS: 1.0000 | ORAL_TABLET | ORAL | 0 refills | Status: DC | PRN

## 2016-03-25 MED ORDER — METHOCARBAMOL 500 MG PO TABS: 500.0000 mg | ORAL_TABLET | Freq: Four times a day (QID) | ORAL | 0 refills | Status: DC | PRN

## 2016-03-25 NOTE — Discharge Summary (Signed)
Discharge Diagnoses:  Active Problems:   Right tibial fracture   Nondisplaced spiral fracture of shaft of unspecified tibia, initial encounter for closed fracture   Surgeries:  on     Consultants: Treatment Team:  Newt Minion, MD  Discharged Condition: Improved  Hospital Course: [name] is an 61 y.o. male who was admitted 03/22/2016 with a chief complaint of right leg pain status post slipping on the ice, with a final diagnosis of a nondisplaced spiral fracture shaft right tibia.  Patient underwent closed treatment with immobilization..    Patient was given perioperative antibiotics: Anti-infectives    None    .  Patient was given sequential compression devices, early ambulation, and aspirin for DVT prophylaxis.  Recent vital signs: Patient Vitals for the past 24 hrs:  BP Temp Temp src Pulse Resp SpO2  03/25/16 0524 (!) 104/45 98.2 F (36.8 C) Oral 71 16 96 %  03/24/16 2039 121/69 98.7 F (37.1 C) Oral 68 - 97 %  03/24/16 1722 (!) 108/59 - - - - -  03/24/16 1157 118/73 97.8 F (36.6 C) Oral 65 17 98 %  03/24/16 0801 127/75 - - 84 - 98 %  03/24/16 0644 121/75 97.9 F (36.6 C) - 65 18 98 %  .  Recent laboratory studies: No results found.  Discharge Medications:   Allergies as of 03/25/2016   No Known Allergies     Medication List    STOP taking these medications   HYDROcodone-acetaminophen 5-325 MG tablet Commonly known as:  NORCO/VICODIN     TAKE these medications   aspirin 325 MG EC tablet Take 1 tablet (325 mg total) by mouth daily.   metFORMIN 1000 MG tablet Commonly known as:  GLUCOPHAGE Take 1,000 mg by mouth 2 (two) times daily with a meal.   methocarbamol 500 MG tablet Commonly known as:  ROBAXIN Take 1 tablet (500 mg total) by mouth every 6 (six) hours as needed for muscle spasms.   metoprolol 50 MG tablet Commonly known as:  LOPRESSOR Take 50 mg by mouth daily.   olmesartan-hydrochlorothiazide 40-25 MG tablet Commonly known as:  BENICAR  HCT Take 1 tablet by mouth daily.   oxyCODONE-acetaminophen 5-325 MG tablet Commonly known as:  PERCOCET/ROXICET Take 1 tablet by mouth every 4 (four) hours as needed for severe pain.       Diagnostic Studies: Dg Chest 2 View  Result Date: 03/22/2016 CLINICAL DATA:  61 year old male under preoperative evaluation prior to tibial surgery. EXAM: CHEST  2 VIEW COMPARISON:  Chest x-ray 04/18/2008. FINDINGS: Lung volumes are normal. No consolidative airspace disease. No pleural effusions. No pneumothorax. No pulmonary nodule or mass noted. Pulmonary vasculature and the cardiomediastinal silhouette are within normal limits. IMPRESSION: No radiographic evidence of acute cardiopulmonary disease. Electronically Signed   By: Vinnie Langton M.D.   On: 03/22/2016 21:14   Dg Tibia/fibula Right  Result Date: 03/22/2016 CLINICAL DATA:  Right lower leg injury and pain due to a slip and fall on ice today. Initial encounter. EXAM: RIGHT TIBIA AND FIBULA - 2 VIEW COMPARISON:  None. FINDINGS: The patient has a spiral fracture of the distal diaphysis of the right fibula with slight lateral displacement of the distal fragment. Oblique fracture of the distal diaphysis of the fibula shows slight posterior displacement of the distal fragment. No other acute bony or joint abnormality. Atherosclerosis noted. IMPRESSION: Acute distal diaphyseal fractures of the tibia and fibula. Electronically Signed   By: Inge Rise M.D.   On:  03/22/2016 18:43   Dg Ankle Complete Right  Result Date: 03/22/2016 CLINICAL DATA:  Right ankle pain due to an injury secondary to a slip and fall on ice today. Initial encounter. EXAM: RIGHT ANKLE - COMPLETE 3+ VIEW COMPARISON:  None. FINDINGS: The patient has a spiral fracture through the distal diaphysis of the tibia. The fracture shows slight lateral displacement of the distal fragment. There is also an oblique fracture of the distal diaphysis of the fibula with slight posterior displacement  of the distal fragment. IMPRESSION: Acute distal diaphyseal fractures of the right tibia and fibula as above. Electronically Signed   By: Inge Rise M.D.   On: 03/22/2016 18:41   Ct Head Wo Contrast  Result Date: 03/22/2016 CLINICAL DATA:  [place] and hit head today.  Headache. EXAM: CT HEAD WITHOUT CONTRAST TECHNIQUE: Contiguous axial images were obtained from the base of the skull through the vertex without intravenous contrast. COMPARISON:  CT head 04/18/2008 FINDINGS: Brain: Moderate atrophy with progression. Chronic microvascular ischemic changes throughout the white matter with progression. Negative for acute infarct.  Negative for acute hemorrhage or mass. Vascular: No hyperdense vessel or unexpected calcification. Skull: Negative for fracture. Sinuses/Orbits: Negative Other: None IMPRESSION: No acute intracranial abnormality Atrophy and chronic microvascular ischemia change have progressed since 2010. Electronically Signed   By: Franchot Gallo M.D.   On: 03/22/2016 18:19    Patient benefited maximally from their hospital stay and there were no complications.     Disposition: 01-Home or Self Care Discharge Instructions    Call MD / Call 911    Complete by:  As directed    If you experience chest pain or shortness of breath, CALL 911 and be transported to the hospital emergency room.  If you develope a fever above 101 F, pus (white drainage) or increased drainage or redness at the wound, or calf pain, call your surgeon's office.   Constipation Prevention    Complete by:  As directed    Drink plenty of fluids.  Prune juice may be helpful.  You may use a stool softener, such as Colace (over the counter) 100 mg twice a day.  Use MiraLax (over the counter) for constipation as needed.   Diet - low sodium heart healthy    Complete by:  As directed    Elevate operative extremity    Complete by:  As directed    Increase activity slowly as tolerated    Complete by:  As directed    Non weight  bearing    Complete by:  As directed    Laterality:  right   Extremity:  Lower     Follow-up Information    Newt Minion, MD Follow up in 2 week(s).   Specialty:  Orthopedic Surgery Contact information: [place] [address] [phone]            Signed: Newt Minion 03/25/2016, 6:42 AM

## 2016-03-25 NOTE — Progress Notes (Signed)
Patient complains of pain in the great toe on the left foot. MD notifed

## 2016-03-25 NOTE — Progress Notes (Signed)
Physical Therapy Treatment Patient Details Name: [name] MRN: [id number] DOB: [birth year] Today's Date: 03/25/2016    History of Present Illness 61 yo male admitted on 03/22/16 following a fall on the ice resulting in a right tibial fracture. Surgeon has decided not to proceed with a surgical repair. PMH significant for GERD, Depression, HTN, Macular degeneration, Prostate CA.    PT Comments    Patient limited by c/o pain and tolerated short gait distance in room. Pt educated on benefits of OOB mobility and encouraged to increase mobility and sitting OOB in recliner throughout the day. Continue to progress as tolerated with anticipated d/c to SNF for further skilled PT services.    Follow Up Recommendations  SNF     Equipment Recommendations  None recommended by PT;Other (comment) (defer to next venue)    Recommendations for Other Services       Precautions / Restrictions Precautions Precautions: Fall Precaution Comments: pt continues to be impulsive and follows instructions ~50% time Restrictions Weight Bearing Restrictions: Yes RLE Weight Bearing: Non weight bearing    Mobility  Bed Mobility Overal bed mobility: Needs Assistance Bed Mobility: Supine to Sit     Supine to sit: HOB elevated;Min assist     General bed mobility comments: relies on railings and requires Min A to bring RLE EOB  Transfers Overall transfer level: Needs assistance Equipment used: Rolling walker (2 wheeled) Transfers: Sit to/from Stand Sit to Stand: Min assist         General transfer comment: assist to lower R LE when coming into standing and to weight shift when transitioning hands to RW  Ambulation/Gait Ambulation/Gait assistance: Min assist Ambulation Distance (Feet): 16 Feet Assistive device: Rolling walker (2 wheeled) Gait Pattern/deviations: Step-to pattern Gait velocity: decreased   General Gait Details: slow gait; cues for safe use of AD as pt tends to lean on RW when  fatigued; cues for proximity of RW and sequencing; pt declined further mobility despite encouragement   Stairs            Wheelchair Mobility    Modified Rankin (Stroke Patients Only)       Balance Overall balance assessment: Needs assistance Sitting-balance support: No upper extremity supported;Feet supported Sitting balance-Leahy Scale: Good Sitting balance - Comments: sitting EOB with no back support   Standing balance support: Bilateral upper extremity supported Standing balance-Leahy Scale: Poor Standing balance comment: relies on Rw for stability in standing                    Cognition Arousal/Alertness: Awake/alert Behavior During Therapy: Agitated;Impulsive Overall Cognitive Status: No family/caregiver present to determine baseline cognitive functioning                 General Comments: pt becomes agitated when given cues and very much likes to do things his own way    Exercises      General Comments General comments (skin integrity, edema, etc.): encouraged pt to sit up and OOB bed in recliner and pt agreed to sit up until his linens are changed--NT aware; pt unwilling to sit back in chair and/or elevate bilat LE despite education and encouragement      Pertinent Vitals/Pain Pain Assessment: Faces Faces Pain Scale: Hurts little more Pain Location: right ankle Pain Descriptors / Indicators: Aching;Guarding;Throbbing Pain Intervention(s): Limited activity within patient's tolerance;Monitored during session;Premedicated before session;Repositioned    Home Living  Prior Function            PT Goals (current goals can now be found in the care plan section) Acute Rehab PT Goals Patient Stated Goal: go to rehab Progress towards PT goals: Progressing toward goals    Frequency    Min 3X/week      PT Plan Current plan remains appropriate    Co-evaluation             End of Session Equipment  Utilized During Treatment: Gait belt Activity Tolerance: Patient limited by pain Patient left: in chair;with call bell/phone within reach;Other (comment) (no chair alarms present on unit--RN aware)     Time: [id number] PT Time Calculation (min) (ACUTE ONLY): 34 min  Charges:  $Gait Training: 8-22 mins $Therapeutic Activity: 8-22 mins                    G Codes:      [place] April, PTA Pager: [phone]   03/25/2016, 3:35 PM

## 2016-03-26 DIAGNOSIS — S82244D Nondisplaced spiral fracture of shaft of right tibia, subsequent encounter for closed fracture with routine healing: Secondary | ICD-10-CM | POA: Diagnosis not present

## 2016-03-26 DIAGNOSIS — K5900 Constipation, unspecified: Secondary | ICD-10-CM | POA: Diagnosis not present

## 2016-03-26 DIAGNOSIS — E119 Type 2 diabetes mellitus without complications: Secondary | ICD-10-CM | POA: Diagnosis not present

## 2016-03-26 DIAGNOSIS — S82112A Displaced fracture of left tibial spine, initial encounter for closed fracture: Secondary | ICD-10-CM | POA: Diagnosis not present

## 2016-03-26 DIAGNOSIS — S82246A Nondisplaced spiral fracture of shaft of unspecified tibia, initial encounter for closed fracture: Secondary | ICD-10-CM | POA: Diagnosis not present

## 2016-03-26 DIAGNOSIS — F411 Generalized anxiety disorder: Secondary | ICD-10-CM | POA: Diagnosis not present

## 2016-03-26 DIAGNOSIS — I10 Essential (primary) hypertension: Secondary | ICD-10-CM | POA: Diagnosis not present

## 2016-03-26 DIAGNOSIS — S82209A Unspecified fracture of shaft of unspecified tibia, initial encounter for closed fracture: Secondary | ICD-10-CM | POA: Diagnosis not present

## 2016-03-26 DIAGNOSIS — B379 Candidiasis, unspecified: Secondary | ICD-10-CM | POA: Diagnosis not present

## 2016-03-26 DIAGNOSIS — Z9181 History of falling: Secondary | ICD-10-CM | POA: Diagnosis not present

## 2016-03-26 DIAGNOSIS — R278 Other lack of coordination: Secondary | ICD-10-CM | POA: Diagnosis not present

## 2016-03-26 DIAGNOSIS — G8929 Other chronic pain: Secondary | ICD-10-CM | POA: Diagnosis not present

## 2016-03-26 DIAGNOSIS — R531 Weakness: Secondary | ICD-10-CM | POA: Diagnosis not present

## 2016-03-26 DIAGNOSIS — M1000 Idiopathic gout, unspecified site: Secondary | ICD-10-CM | POA: Diagnosis not present

## 2016-03-26 DIAGNOSIS — H3530 Unspecified macular degeneration: Secondary | ICD-10-CM | POA: Diagnosis not present

## 2016-03-26 DIAGNOSIS — R21 Rash and other nonspecific skin eruption: Secondary | ICD-10-CM | POA: Diagnosis not present

## 2016-03-26 DIAGNOSIS — M6281 Muscle weakness (generalized): Secondary | ICD-10-CM | POA: Diagnosis not present

## 2016-03-26 LAB — GLUCOSE, CAPILLARY
Glucose-Capillary: 140 mg/dL — ABNORMAL HIGH (ref 65–99)
Glucose-Capillary: 150 mg/dL — ABNORMAL HIGH (ref 65–99)

## 2016-03-26 MED ORDER — COLCHICINE 0.6 MG PO TABS
0.6000 mg | ORAL_TABLET | Freq: Two times a day (BID) | ORAL | Status: DC
  Administered 2016-03-26: 0.6 mg via ORAL
  Filled 2016-03-26: qty 1

## 2016-03-26 MED ORDER — COLCHICINE 0.6 MG PO CAPS: 0.6000 mg | ORAL_CAPSULE | Freq: Two times a day (BID) | ORAL | 1 refills | Status: DC | PRN

## 2016-03-26 MED ORDER — ALLOPURINOL 100 MG PO TABS: 100.0000 mg | ORAL_TABLET | Freq: Two times a day (BID) | ORAL | 3 refills | Status: DC

## 2016-03-26 MED ORDER — COLCHICINE 0.6 MG PO TABS: 0.6000 mg | ORAL_TABLET | Freq: Two times a day (BID) | ORAL | Status: DC

## 2016-03-26 NOTE — Plan of Care (Signed)
Problem: Pain Managment: Goal: General experience of comfort will improve Outcome: Progressing Denies pain this shift  Problem: Physical Regulation: Goal: Will remain free from infection Outcome: Progressing No s/s of infection noted  Problem: Tissue Perfusion: Goal: Risk factors for ineffective tissue perfusion will decrease Outcome: Progressing Denies S/S of DVT  Problem: Activity: Goal: Risk for activity intolerance will decrease Outcome: Progressing Tolerates activities well  Problem: Bowel/Gastric: Goal: Will not experience complications related to bowel motility Outcome: Progressing No gastric or bowel issues reported this shift, patient had a BM on 03/25/2016

## 2016-03-26 NOTE — Progress Notes (Signed)
Patient ID: [name], male   DOB: 1955-08-16, 60 y.o.   MRN: [id number] Patient with redness and pain left great toe. Uric acid 8.0. Patient with acute gout. Orders written for colchicine as an inpatient and orders written for colchicine and allopurinol for discharge. Plan for discharge to skilled nursing today. History and physical and orders updated

## 2016-03-26 NOTE — Clinical Social Work Note (Signed)
Clinical Social Worker facilitated patient discharge including contacting patient family and facility to confirm patient discharge plans.  Clinical information faxed to facility and family agreeable with plan.  CSW arranged ambulance transport via [place] to [facility].  RN to call for report prior to discharge.  Clinical Social Worker will sign off for now as social work intervention is no longer needed. Please consult [place] again if new need arises.  [address], [place]

## 2016-03-26 NOTE — Progress Notes (Signed)
Reviewed discharge instructions/medications with patient.  Answered his questions.  Called report to [facility] and was connected to voicemail.  I left a message, however will try calling them again shortly.  Patient's transport has been set up for 2pm.

## 2016-03-26 NOTE — Discharge Summary (Signed)
Discharge Diagnoses:  Active Problems:   Right tibial fracture   Nondisplaced spiral fracture of shaft of unspecified tibia, initial encounter for closed fracture  Acute gout left great toe  Surgeries:  Patient treated with closed reduction right tibia and fibular fracture   Consultants: Treatment Team:  Newt Minion, MD  Discharged Condition: Improved  Hospital Course: [name] is an 61 y.o. male who was admitted 03/22/2016 with a chief complaint of right leg pain., with a final diagnosis of a nondisplaced right tibial fracture..  Patient underwent closed treatment for the right tibia fracture. During his hospital stay developed left great toe pain uric acid was obtained and this was 8.0. Patient was started on colchicine and was discharged with prescriptions for colchicine and allopurinol.  Patient was given perioperative antibiotics:  Anti-infectives    None    .  Patient was given sequential compression devices, early ambulation, and aspirin for DVT prophylaxis.  Recent vital signs:  Patient Vitals for the past 24 hrs:  BP Temp Temp src Pulse Resp SpO2  03/25/16 2023 110/61 98.7 F (37.1 C) Oral 87 16 96 %  03/25/16 1300 112/72 97.5 F (36.4 C) Oral 87 16 98 %  03/25/16 1051 118/75 97.7 F (36.5 C) Oral 95 16 -  .  Recent laboratory studies: No results found.  Discharge Medications:   Allergies as of 03/26/2016   No Known Allergies     Medication List    STOP taking these medications   HYDROcodone-acetaminophen 5-325 MG tablet Commonly known as:  NORCO/VICODIN     TAKE these medications   allopurinol 100 MG tablet Commonly known as:  ZYLOPRIM Take 1 tablet (100 mg total) by mouth 2 (two) times daily.   aspirin 325 MG EC tablet Take 1 tablet (325 mg total) by mouth daily.   Colchicine 0.6 MG Caps Take 0.6 mg by mouth 2 (two) times daily as needed.   colchicine 0.6 MG tablet Take 1 tablet (0.6 mg total) by mouth 2 (two) times daily.   metFORMIN 1000  MG tablet Commonly known as:  GLUCOPHAGE Take 1,000 mg by mouth 2 (two) times daily with a meal.   methocarbamol 500 MG tablet Commonly known as:  ROBAXIN Take 1 tablet (500 mg total) by mouth every 6 (six) hours as needed for muscle spasms.   metoprolol 50 MG tablet Commonly known as:  LOPRESSOR Take 50 mg by mouth daily.   olmesartan-hydrochlorothiazide 40-25 MG tablet Commonly known as:  BENICAR HCT Take 1 tablet by mouth daily.   oxyCODONE-acetaminophen 5-325 MG tablet Commonly known as:  PERCOCET/ROXICET Take 1 tablet by mouth every 4 (four) hours as needed for severe pain.       Diagnostic Studies: Dg Chest 2 View  Result Date: 03/22/2016 CLINICAL DATA:  61 year old male under preoperative evaluation prior to tibial surgery. EXAM: CHEST  2 VIEW COMPARISON:  Chest x-ray 04/18/2008. FINDINGS: Lung volumes are normal. No consolidative airspace disease. No pleural effusions. No pneumothorax. No pulmonary nodule or mass noted. Pulmonary vasculature and the cardiomediastinal silhouette are within normal limits. IMPRESSION: No radiographic evidence of acute cardiopulmonary disease. Electronically Signed   By: Vinnie Langton M.D.   On: 03/22/2016 21:14   Dg Tibia/fibula Right  Result Date: 03/22/2016 CLINICAL DATA:  Right lower leg injury and pain due to a slip and fall on ice today. Initial encounter. EXAM: RIGHT TIBIA AND FIBULA - 2 VIEW COMPARISON:  None. FINDINGS: The patient has a spiral fracture of the distal  diaphysis of the right fibula with slight lateral displacement of the distal fragment. Oblique fracture of the distal diaphysis of the fibula shows slight posterior displacement of the distal fragment. No other acute bony or joint abnormality. Atherosclerosis noted. IMPRESSION: Acute distal diaphyseal fractures of the tibia and fibula. Electronically Signed   By: Inge Rise M.D.   On: 03/22/2016 18:43   Dg Ankle Complete Right  Result Date: 03/22/2016 CLINICAL DATA:   Right ankle pain due to an injury secondary to a slip and fall on ice today. Initial encounter. EXAM: RIGHT ANKLE - COMPLETE 3+ VIEW COMPARISON:  None. FINDINGS: The patient has a spiral fracture through the distal diaphysis of the tibia. The fracture shows slight lateral displacement of the distal fragment. There is also an oblique fracture of the distal diaphysis of the fibula with slight posterior displacement of the distal fragment. IMPRESSION: Acute distal diaphyseal fractures of the right tibia and fibula as above. Electronically Signed   By: Inge Rise M.D.   On: 03/22/2016 18:41   Ct Head Wo Contrast  Result Date: 03/22/2016 CLINICAL DATA:  [place] and hit head today.  Headache. EXAM: CT HEAD WITHOUT CONTRAST TECHNIQUE: Contiguous axial images were obtained from the base of the skull through the vertex without intravenous contrast. COMPARISON:  CT head 04/18/2008 FINDINGS: Brain: Moderate atrophy with progression. Chronic microvascular ischemic changes throughout the white matter with progression. Negative for acute infarct.  Negative for acute hemorrhage or mass. Vascular: No hyperdense vessel or unexpected calcification. Skull: Negative for fracture. Sinuses/Orbits: Negative Other: None IMPRESSION: No acute intracranial abnormality Atrophy and chronic microvascular ischemia change have progressed since 2010. Electronically Signed   By: Franchot Gallo M.D.   On: 03/22/2016 18:19    Patient benefited maximally from their hospital stay and there were no complications.     Disposition: 01-Home or Self Care Discharge Instructions    Call MD / Call 911    Complete by:  As directed    If you experience chest pain or shortness of breath, CALL 911 and be transported to the hospital emergency room.  If you develope a fever above 101 F, pus (white drainage) or increased drainage or redness at the wound, or calf pain, call your surgeon's office.   Call MD / Call 911    Complete by:  As directed     If you experience chest pain or shortness of breath, CALL 911 and be transported to the hospital emergency room.  If you develope a fever above 101 F, pus (white drainage) or increased drainage or redness at the wound, or calf pain, call your surgeon's office.   Constipation Prevention    Complete by:  As directed    Drink plenty of fluids.  Prune juice may be helpful.  You may use a stool softener, such as Colace (over the counter) 100 mg twice a day.  Use MiraLax (over the counter) for constipation as needed.   Constipation Prevention    Complete by:  As directed    Drink plenty of fluids.  Prune juice may be helpful.  You may use a stool softener, such as Colace (over the counter) 100 mg twice a day.  Use MiraLax (over the counter) for constipation as needed.   Diet - low sodium heart healthy    Complete by:  As directed    Diet - low sodium heart healthy    Complete by:  As directed    Elevate operative extremity  Complete by:  As directed    Increase activity slowly as tolerated    Complete by:  As directed    Increase activity slowly as tolerated    Complete by:  As directed    Non weight bearing    Complete by:  As directed    Laterality:  right   Extremity:  Lower     Follow-up Information    Newt Minion, MD Follow up in 2 week(s).   Specialty:  Orthopedic Surgery Contact information: [place] [address] [phone]            Signed: Newt Minion 03/26/2016, 6:56 AM

## 2016-03-26 NOTE — Care Management Important Message (Signed)
Important Message  Patient Details  Name: [name] MRN: [id number] Date of Birth: [birth year]   Medicare Important Message Given:  Yes    [name] [name] 03/26/2016, 11:05 AM

## 2016-03-27 DIAGNOSIS — E119 Type 2 diabetes mellitus without complications: Secondary | ICD-10-CM | POA: Diagnosis not present

## 2016-03-27 DIAGNOSIS — I10 Essential (primary) hypertension: Secondary | ICD-10-CM | POA: Diagnosis not present

## 2016-04-01 DIAGNOSIS — R21 Rash and other nonspecific skin eruption: Secondary | ICD-10-CM | POA: Diagnosis not present

## 2016-04-17 ENCOUNTER — Ambulatory Visit (INDEPENDENT_AMBULATORY_CARE_PROVIDER_SITE_OTHER): Payer: Medicare Other | Admitting: Orthopedic Surgery

## 2016-04-17 ENCOUNTER — Ambulatory Visit (INDEPENDENT_AMBULATORY_CARE_PROVIDER_SITE_OTHER): Payer: No Typology Code available for payment source

## 2016-04-17 DIAGNOSIS — S82246A Nondisplaced spiral fracture of shaft of unspecified tibia, initial encounter for closed fracture: Secondary | ICD-10-CM

## 2016-04-17 NOTE — Progress Notes (Addendum)
Office Visit Note   Patient: [name]           Date of Birth: 08-13-1955           MRN: [id number] Visit Date: 04/17/2016              Requested by: No referring provider defined for this encounter. PCP: Pcp Not In System   Assessment & Plan: Visit Diagnoses:  1. Nondisplaced spiral fracture of shaft of unspecified tibia, initial encounter for closed fracture     Plan: Apply short leg cast today continue nonweightbearing on the right follow-up in the office in 3 weeks with repeat 2 view radiographs of the right tibia and fibula out of plaster.  Follow-Up Instructions: Return in about 3 weeks (around 05/08/2016).   Orders:  Orders Placed This Encounter  Procedures  . XR Tibia/Fibula Right   No orders of the defined types were placed in this encounter.     Procedures: No procedures performed   Clinical Data: No additional findings.   Subjective: No chief complaint on file.   Patient is here for follow up status post closed reduction of right tibia/fibular fracture on 03/22/2016. He states that he is doing much better. He is nonweightbearing in the splint. He describes "awful" pain x first two weeks, but that has gotten better. In the hospital, his uric acid was elevated and he was put on Colchicine. Patient is taking Oxycodone 5/325 and Methacarbamol for pain. He presently resides at [facility].     Review of Systems   Objective: Vital Signs: There were no vitals taken for this visit.  Physical Exam on examination patient is alert oriented no adenopathy well-dressed, affect normal respiratory effort is nonweightbearing on the right. Examination the skin is intact there is no blisters no skin breakdown no open wounds no signs of infection.  Ortho Exam  Specialty Comments:  No specialty comments available.  Imaging: Xr Tibia/fibula Right  Result Date: 04/17/2016 Two-view radiographs of the right tibia and fibula show stable alignment in both AP and  lateral planes there is no extension in the lateral view no shortening in the AP view there is some mild callus formation.    PMFS History: Patient Active Problem List   Diagnosis Date Noted  . Nondisplaced spiral fracture of shaft of unspecified tibia, initial encounter for closed fracture   . Right tibial fracture 03/22/2016  . Microcytic anemia 02/17/2012  . Hemorrhoids 02/17/2012  . Prostate cancer ([place]) 02/17/2012  . 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 HTN (hypertension) 02/17/2012  . Depressive disorder 02/17/2012   Past Medical History:  Diagnosis Date  . Depression   . GERD (gastroesophageal reflux disease)   . Hemorrhoids   . Hypertension   . Macular degeneration   . Microcytic anemia   . Prostate cancer ([place]) 08/2009  . Tubular adenoma     Family History  Problem Relation Age of Onset  . Lung cancer Mother     Deceased  . Throat cancer Brother   . Pancreatic cancer Father   . Heart disease Father     Deceased  . Lung cancer Maternal Uncle     nephew  . Diabetes Other     Past Surgical History:  Procedure Laterality Date  . INGUINAL HERNIA REPAIR    . Left ankle joint fusion  1981  . PROSTATE BIOPSY     x 2   Social History   Occupational History  . Not on file.  Social History Main Topics  . Smoking status: Never Smoker  . Smokeless tobacco: Never Used  . Alcohol use Yes     Comment: Occasional  . Drug use: No  . Sexual activity: Not Currently

## 2016-04-25 DIAGNOSIS — E119 Type 2 diabetes mellitus without complications: Secondary | ICD-10-CM | POA: Diagnosis not present

## 2016-04-25 DIAGNOSIS — B379 Candidiasis, unspecified: Secondary | ICD-10-CM | POA: Diagnosis not present

## 2016-04-25 DIAGNOSIS — I10 Essential (primary) hypertension: Secondary | ICD-10-CM | POA: Diagnosis not present

## 2016-04-25 DIAGNOSIS — S82209A Unspecified fracture of shaft of unspecified tibia, initial encounter for closed fracture: Secondary | ICD-10-CM | POA: Diagnosis not present

## 2016-05-08 ENCOUNTER — Ambulatory Visit (INDEPENDENT_AMBULATORY_CARE_PROVIDER_SITE_OTHER): Payer: Medicare Other | Admitting: Orthopedic Surgery

## 2016-05-16 ENCOUNTER — Telehealth (INDEPENDENT_AMBULATORY_CARE_PROVIDER_SITE_OTHER): Payer: Self-pay | Admitting: Orthopedic Surgery

## 2016-05-16 NOTE — Telephone Encounter (Signed)
Pt requested a call back to discuss his cast. He wants to put weight on it since it feels better.  [phone]

## 2016-05-17 NOTE — Telephone Encounter (Signed)
I called and advised patient against walking. He needs to be nonweightbearing with cast until he get repeat xrays feeling better or not. He expressed understanding.

## 2016-05-21 ENCOUNTER — Telehealth (INDEPENDENT_AMBULATORY_CARE_PROVIDER_SITE_OTHER): Payer: Self-pay | Admitting: *Deleted

## 2016-05-21 NOTE — Telephone Encounter (Signed)
Pt has an appt tomorrow but is calling asking about a form for more rehab.

## 2016-05-21 NOTE — Telephone Encounter (Signed)
Will discuss with MD at appointment tomorrow.

## 2016-05-22 ENCOUNTER — Encounter (INDEPENDENT_AMBULATORY_CARE_PROVIDER_SITE_OTHER): Payer: Self-pay | Admitting: Orthopedic Surgery

## 2016-05-22 ENCOUNTER — Ambulatory Visit (INDEPENDENT_AMBULATORY_CARE_PROVIDER_SITE_OTHER): Payer: Medicare Other | Admitting: Orthopedic Surgery

## 2016-05-22 ENCOUNTER — Ambulatory Visit (INDEPENDENT_AMBULATORY_CARE_PROVIDER_SITE_OTHER): Payer: Medicare Other

## 2016-05-22 ENCOUNTER — Ambulatory Visit (INDEPENDENT_AMBULATORY_CARE_PROVIDER_SITE_OTHER): Payer: Self-pay

## 2016-05-22 DIAGNOSIS — S82241G Displaced spiral fracture of shaft of right tibia, subsequent encounter for closed fracture with delayed healing: Secondary | ICD-10-CM

## 2016-05-22 DIAGNOSIS — M79661 Pain in right lower leg: Secondary | ICD-10-CM | POA: Diagnosis not present

## 2016-05-22 DIAGNOSIS — M25571 Pain in right ankle and joints of right foot: Secondary | ICD-10-CM

## 2016-05-22 MED ORDER — HYDROCODONE-ACETAMINOPHEN 5-325 MG PO TABS: 1.0000 | ORAL_TABLET | Freq: Four times a day (QID) | ORAL | 0 refills | Status: DC | PRN

## 2016-05-22 NOTE — Progress Notes (Signed)
Office Visit Note   Patient: [name]           Date of Birth: 06/04/1955           MRN: [id number] Visit Date: 05/22/2016              Requested by: No referring provider defined for this encounter. PCP: Pcp Not In System  Chief Complaint  Patient presents with  . Right Ankle - Follow-up    HPI: Patient is a 61 y.o male who presents today for follow up right leg fracture. Patient had short leg cast removed in office. His medical doctor is working on placement to another rehab facility where he can also get physical therapy. He complains of tightness. He is requesting prescription of hydrocodone. Maxcine Ham, RT  Patient has a history of polio involving the right lower extremity his leg is small and atrophic. Patient also has type 2 diabetes and hypertension.  Assessment & Plan: Visit Diagnoses:  1. Pain in right ankle and joints of right foot   2. Pain in right lower leg   3. Closed displaced spiral fracture of shaft of right tibia with delayed healing     Plan: The fracture site is stable. And there is no gross motion at the fracture site. Discussed with patient is increased risk of the wound not healing due to his severe peripheral vascular disease discussed the risk of potential amputation if we proceed with surgery. Patient states he would like to continue with casting plan for 3 additional weeks of short leg cast follow-up with 2 view radiographs of the right tibia out of plaster. Prescription provided for Vicodin for pain.  Follow-Up Instructions: Return in about 3 weeks (around 06/12/2016).   Ortho Exam Examination there is no gross clinical varus alignment. Patient does have external rotation of right lower extremity which she states has been chronic secondary to his polio. The leg is thin and atrophic. He does not have a palpable pulse. The fracture site is nontender to palpation. ROS: Complete review of systems negative except as mentioned in the history  present illness Imaging: Xr Tibia/fibula Right  Result Date: 05/22/2016 2 view radiographs of the right tibia and ankle show some varus displacement. Patient states that he has been weightbearing. There is also displacement of the medial malleolar fracture as well. Radiographs do show calcification of popliteal vessels all the way down to the ankle.   Labs: Lab Results  Component Value Date   HGBA1C 8.2 (H) 03/23/2016   HGBA1C 8.2 (H) 03/23/2016   LABURIC 8.0 (H) 03/25/2016    Orders:  Orders Placed This Encounter  Procedures  . XR Tibia/Fibula Right   Meds ordered this encounter  Medications  . HYDROcodone-acetaminophen (NORCO/VICODIN) 5-325 MG tablet    Sig: Take 1 tablet by mouth every 6 (six) hours as needed for moderate pain.    Dispense:  30 tablet    Refill:  0     Procedures: No procedures performed  Clinical Data: No additional findings.  Subjective: Review of Systems  Objective: Vital Signs: There were no vitals taken for this visit.  Specialty Comments:  No specialty comments available.  PMFS History: Patient Active Problem List   Diagnosis Date Noted  . Nondisplaced spiral fracture of shaft of unspecified tibia, initial encounter for closed fracture   . Right tibial fracture 03/22/2016  . Microcytic anemia 02/17/2012  . Hemorrhoids 02/17/2012  . Prostate cancer ([place]) 02/17/2012  . H/O post-polio syndrome  02/17/2012  . HTN (hypertension) 02/17/2012  . Depressive disorder 02/17/2012   Past Medical History:  Diagnosis Date  . Depression   . GERD (gastroesophageal reflux disease)   . Hemorrhoids   . Hypertension   . Macular degeneration   . Microcytic anemia   . Prostate cancer ([place]) 08/2009  . Tubular adenoma     Family History  Problem Relation Age of Onset  . Lung cancer Mother     Deceased  . Throat cancer Brother   . Pancreatic cancer Father   . Heart disease Father     Deceased  . Lung cancer Maternal Uncle     nephew  .  Diabetes Other     Past Surgical History:  Procedure Laterality Date  . INGUINAL HERNIA REPAIR    . Left ankle joint fusion  1981  . PROSTATE BIOPSY     x 2   Social History   Occupational History  . Not on file.   Social History Main Topics  . Smoking status: Never Smoker  . Smokeless tobacco: Never Used  . Alcohol use Yes     Comment: Occasional  . Drug use: No  . Sexual activity: Not Currently

## 2016-05-29 ENCOUNTER — Non-Acute Institutional Stay (SKILLED_NURSING_FACILITY): Payer: Medicare Other | Admitting: Adult Health

## 2016-05-29 ENCOUNTER — Telehealth (INDEPENDENT_AMBULATORY_CARE_PROVIDER_SITE_OTHER): Payer: Self-pay | Admitting: *Deleted

## 2016-05-29 ENCOUNTER — Encounter: Payer: Self-pay | Admitting: Adult Health

## 2016-05-29 DIAGNOSIS — S82255D Nondisplaced comminuted fracture of shaft of left tibia, subsequent encounter for closed fracture with routine healing: Secondary | ICD-10-CM | POA: Diagnosis not present

## 2016-05-29 DIAGNOSIS — E119 Type 2 diabetes mellitus without complications: Secondary | ICD-10-CM | POA: Diagnosis not present

## 2016-05-29 DIAGNOSIS — S82244S Nondisplaced spiral fracture of shaft of right tibia, sequela: Secondary | ICD-10-CM

## 2016-05-29 DIAGNOSIS — E1169 Type 2 diabetes mellitus with other specified complication: Secondary | ICD-10-CM | POA: Diagnosis not present

## 2016-05-29 DIAGNOSIS — E785 Hyperlipidemia, unspecified: Secondary | ICD-10-CM

## 2016-05-29 DIAGNOSIS — I10 Essential (primary) hypertension: Secondary | ICD-10-CM | POA: Diagnosis not present

## 2016-05-29 NOTE — Telephone Encounter (Signed)
Called and advised pt is non weight bearing.

## 2016-05-29 NOTE — Progress Notes (Signed)
Location:   [facility] Room Number: [place] of Service:  SNF (31)   CODE STATUS: Full Code  No Known Allergies   Chief Complaint  Patient presents with  . Acute Visit    follow up transfer     HPI:  He has had a right tib/fib fracture in Jan of this year. He was seen by ortho yesterday; and his fracture is not improving. He is here for short term rehab. His goal is to return home after he completes his therapy. He does have ankle pain. He has had numerous medications stopped per the documentation of his medications; from his fl2 form.    Past Medical History:  Diagnosis Date  . Depression   . GERD (gastroesophageal reflux disease)   . Hemorrhoids   . Hypertension   . Macular degeneration   . Microcytic anemia   . Prostate cancer ([place]) 08/2009  . Tubular adenoma     Past Surgical History:  Procedure Laterality Date  . INGUINAL HERNIA REPAIR    . Left ankle joint fusion  1981  . PROSTATE BIOPSY     x 2    Social History   Social History  . Marital status: Divorced    Spouse name: N/A  . Number of children: 0  . Years of education: N/A   Occupational History  . Not on file.   Social History Main Topics  . Smoking status: Never Smoker  . Smokeless tobacco: Never Used  . Alcohol use Yes     Comment: Occasional  . Drug use: No  . Sexual activity: Not Currently   Other Topics Concern  . Not on file   Social History Narrative  . No narrative on file   Family History  Problem Relation Age of Onset  . Lung cancer Mother     Deceased  . Throat cancer Brother   . Pancreatic cancer Father   . Heart disease Father     Deceased  . Lung cancer Maternal Uncle     nephew  . Diabetes Other       VITAL SIGNS BP 128/72   Pulse 68   Temp 98.8 F (37.1 C)   Resp 16   Ht 5\' 6"  (1.676 m)   SpO2 98%   Patient's Medications  New Prescriptions   No medications on file  Previous Medications   ASPIRIN EC 81 MG TABLET    Take 81 mg by  mouth daily.   ATORVASTATIN (LIPITOR) 20 MG TABLET    Take 20 mg by mouth daily.   METFORMIN (GLUCOPHAGE) 1000 MG TABLET    Take 1,000 mg by mouth 2 (two) times daily with a meal.   METOPROLOL (LOPRESSOR) 50 MG TABLET    Take 50 mg by mouth daily.   MULTIPLE VITAMIN (MULTIVITAMIN) TABLET    Take 1 tablet by mouth daily.   OLMESARTAN-HYDROCHLOROTHIAZIDE (BENICAR HCT) 40-25 MG TABLET    Take 1 tablet by mouth daily.  Modified Medications   No medications on file  Discontinued Medications     SIGNIFICANT DIAGNOSTIC EXAMS  03-22-16: right ankle x-ray: Acute distal diaphyseal fractures of the right tibia and fibula as  Above.  03-22-16: right tibia/fibula x-ray: Acute distal diaphyseal fractures of the tibia and fibula.  04-17-16: right tibia/fibula x-ray: Two-view radiographs of the right tibia and fibula show stable alignment  in both AP and lateral planes there is no extension in the lateral view no  shortening in the AP view  there is some mild callus formation.  05-22-16: right tibia/fibula x-ray: 2 view radiographs of the right tibia and ankle show some varus  displacement. Patient states that he has been weightbearing. There is also  displacement of the medial malleolar fracture as well. Radiographs do show  calcification of popliteal vessels all the way down to the ankle.   LABS REVIEWED:   03-22-16: wbc 16.8; hgb 17.9; hct 51.8; mcv 85.2; plt 263; glucose 219; bun 17; creat 1.05; k+ 4.3; na++ 138; hgb a1c 8.2 03-25-16: uric acid 8.0   Review of Systems  Constitutional: Negative for malaise/fatigue.  Respiratory: Negative for cough and shortness of breath.   Cardiovascular: Negative for chest pain, palpitations and leg swelling.  Gastrointestinal: Negative for abdominal pain, constipation and heartburn.  Musculoskeletal: Positive for joint pain. Negative for back pain and myalgias.       Right ankle pain   Skin: Negative.   Neurological: Negative for dizziness.  Psychiatric/Behavioral:  The patient is not nervous/anxious.     Physical Exam  Constitutional: He is oriented to person, place, and time. No distress.  Eyes: Conjunctivae are normal.  Neck: Neck supple. No JVD present. No thyromegaly present.  Cardiovascular: Normal rate, regular rhythm and intact distal pulses.   Respiratory: Effort normal and breath sounds normal. No respiratory distress. He has no wheezes.  GI: Soft. Bowel sounds are normal. He exhibits no distension. There is no tenderness.  Musculoskeletal: He exhibits no edema.  Able to move all extremities  Right ankle in hard cast   Lymphadenopathy:    He has no cervical adenopathy.  Neurological: He is alert and oriented to person, place, and time.  Skin: Skin is warm and dry. He is not diaphoretic.  Psychiatric: He has a normal mood and affect.      ASSESSMENT/ PLAN:  1. Hypertension: will continue lopressor 50 mg daily benicar hct 40/25 mg daily and asa 81 mg daily   2. Dyslipidemia: will continue lipitor 20 mg daily   3. Diabetes: hgb a1c 8.2; will continue metformin 1 gm twice daily   4. Spiral fracture of shaft of tibia: is followed by ortho. Will continue therapy as directed.  Will begin vicodin 5/325 mg 1 or 2 tabs every 6 hours as needed. Will monitor   Time spent with patient   50 minutes >50% time spent counseling; reviewing medical record; tests; labs; and developing future plan of care   MD is aware of resident's narcotic use and is in agreement with current plan of care. We will attempt to wean resident as apropriate   Ok Edwards NP [facility]  Contact [phone] Monday through Friday 8am- 5pm  After hours call [phone]

## 2016-05-29 NOTE — Telephone Encounter (Signed)
James calling for weight baring status. CB: [phone] Ok to leave message on voice mail

## 2016-05-30 DIAGNOSIS — S82255D Nondisplaced comminuted fracture of shaft of left tibia, subsequent encounter for closed fracture with routine healing: Secondary | ICD-10-CM | POA: Diagnosis not present

## 2016-05-31 ENCOUNTER — Encounter: Payer: Self-pay | Admitting: Internal Medicine

## 2016-05-31 ENCOUNTER — Non-Acute Institutional Stay (SKILLED_NURSING_FACILITY): Payer: Medicare Other | Admitting: Internal Medicine

## 2016-05-31 DIAGNOSIS — E1169 Type 2 diabetes mellitus with other specified complication: Secondary | ICD-10-CM

## 2016-05-31 DIAGNOSIS — F329 Major depressive disorder, single episode, unspecified: Secondary | ICD-10-CM

## 2016-05-31 DIAGNOSIS — B372 Candidiasis of skin and nail: Secondary | ICD-10-CM | POA: Diagnosis not present

## 2016-05-31 DIAGNOSIS — I10 Essential (primary) hypertension: Secondary | ICD-10-CM

## 2016-05-31 DIAGNOSIS — E785 Hyperlipidemia, unspecified: Secondary | ICD-10-CM | POA: Diagnosis not present

## 2016-05-31 DIAGNOSIS — S82255D Nondisplaced comminuted fracture of shaft of left tibia, subsequent encounter for closed fracture with routine healing: Secondary | ICD-10-CM | POA: Diagnosis not present

## 2016-05-31 DIAGNOSIS — F32A Depression, unspecified: Secondary | ICD-10-CM

## 2016-05-31 DIAGNOSIS — E119 Type 2 diabetes mellitus without complications: Secondary | ICD-10-CM | POA: Diagnosis not present

## 2016-05-31 DIAGNOSIS — C61 Malignant neoplasm of prostate: Secondary | ICD-10-CM | POA: Diagnosis not present

## 2016-05-31 DIAGNOSIS — S82246A Nondisplaced spiral fracture of shaft of unspecified tibia, initial encounter for closed fracture: Secondary | ICD-10-CM

## 2016-05-31 NOTE — Progress Notes (Signed)
Patient ID: [name], male   DOB: 11-14-1955, 61 y.o.   MRN: [id number]  Provider:  Rexene Edison. Mariea Clonts, D.O., C.M.D. Location:  [place] Room Number: [place] of Service:  SNF (31)  PCP: [name] Patient Care Team: [name] as PCP - General (General Practice)  Extended Emergency Contact Information Primary Emergency Contact: Clonch,Janet  [place] Phone: [phone] Relation: Sister  Code Status: full code Goals of Care: Advanced Directive information Advanced Directives 05/31/2016  Does Patient Have a Medical Advance Directive? No  Would patient like information on creating a medical advance directive? No - Patient declined    Chief Complaint  Patient presents with  . New Admit To SNF    HPI: Patient is a 61 y.o. male seen today for admission to SNF at [facility]. He has a h/o depression, anxiety, gerd, hemorrhoids, macular dystrophy/Stangardt's disease (poor vision), cataracts, prostate cancer (considering xrt vs surgery but needs f/u with his urologist), microcytic anemia, cataracts, DMII, htn, hyperlipidemia and obesity.  He was admitted here after hospitalization for right leg fracture (distal diaphysis of tib-fib).  He is in a cast as per [name].  He remains nonweightbearing and is using a walker to get around the facility or his wheelchair for longer distances.  He's doing PT, OT.  He was started on hydrocodone for his pain and reports needing two tablets at times when his pain is bad first thing in the am or after his leg is hanging down for a while.    He also admits to significant anxiety at times that interferes with his sleep.  He was on lorazepam at one time and would like a bedtime dose to help with his nerves and sleep.  He also has a rash in his groin and on his scrotum which is itchy, tender and raw.    His diabetes has been under good control, he reports, but hba1c was 8.2 last check in Jan.    RE: his  prostate cancer, he's had an increase in the number of positive areas for cancer on his latest biopsy, but has not yet followed up due to his leg fracture--plans to go back and consider prostatectomy vs. XRT.    BP is well controlled with benicar hct and lopressor 50mg  daily.  Lipids have been at goal with lipitor.    Re: his vision, he reports that his ophthalmologist at [facility] has plans to perform a procedure on his eyes that may restore his vision.    Past Medical History:  Diagnosis Date  . Depression   . GERD (gastroesophageal reflux disease)   . Hemorrhoids   . Hypertension   . Macular degeneration   . Microcytic anemia   . Prostate cancer ([place]) 08/2009  . Tubular adenoma    Past Surgical History:  Procedure Laterality Date  . INGUINAL HERNIA REPAIR    . Left ankle joint fusion  1981  . PROSTATE BIOPSY     x 2    Social History   Social History  . Marital status: Divorced    Spouse name: N/A  . Number of children: 0  . Years of education: N/A   Social History Main Topics  . Smoking status: Never Smoker  . Smokeless tobacco: Never Used  . Alcohol use Yes     Comment: Occasional  . Drug use: No  . Sexual activity: Not Currently   Other Topics Concern  . None   Social  History Narrative  . None    reports that he has never smoked. He has never used smokeless tobacco. He reports that he drinks alcohol. He reports that he does not use drugs.  Functional Status Survey:  was living independently before his fall on ice outside of his apt, has to go to the second floor at his home  Family History  Problem Relation Age of Onset  . Lung cancer Mother     Deceased  . Throat cancer Brother   . Pancreatic cancer Father   . Heart disease Father     Deceased  . Lung cancer Maternal Uncle     nephew  . Diabetes Other     Health Maintenance  Topic Date Due  . PNEUMOCOCCAL POLYSACCHARIDE VACCINE (1) 01/21/1956  . FOOT EXAM  01/21/1964  . OPHTHALMOLOGY EXAM   01/21/1964  . INFLUENZA VACCINE  05/29/2017 (Originally 10/17/2015)  . TETANUS/TDAP  05/29/2017 (Originally 01/20/1973)  . Hepatitis C Screening  05/29/2017 (Originally [birth year])  . HIV Screening  05/29/2017 (Originally 01/20/1969)  . HEMOGLOBIN A1C  09/20/2016  . COLONOSCOPY  02/19/2017    No Known Allergies  Allergies as of 05/31/2016   No Known Allergies     Medication List       Accurate as of 05/31/16  4:16 PM. Always use your most recent med list.          aspirin EC 81 MG tablet Take 81 mg by mouth daily.   atorvastatin 20 MG tablet Commonly known as:  LIPITOR Take 20 mg by mouth daily.   HYDROcodone-acetaminophen 5-325 MG tablet Commonly known as:  NORCO/VICODIN Take 1-2 tablets by mouth every 6 (six) hours as needed for moderate pain.   metFORMIN 1000 MG tablet Commonly known as:  GLUCOPHAGE Take 1,000 mg by mouth 2 (two) times daily with a meal.   metoprolol 50 MG tablet Commonly known as:  LOPRESSOR Take 50 mg by mouth daily.   multivitamin tablet Take 1 tablet by mouth daily.   olmesartan-hydrochlorothiazide 40-25 MG tablet Commonly known as:  BENICAR HCT Take 1 tablet by mouth daily.       Review of Systems  Constitutional: Negative for chills, fever and malaise/fatigue.  HENT: Negative for hearing loss.   Eyes: Positive for blurred vision.  Respiratory: Negative for cough and shortness of breath.   Cardiovascular: Negative for chest pain, palpitations, orthopnea and leg swelling.  Gastrointestinal: Negative for abdominal pain, blood in stool, constipation, diarrhea, melena, nausea and vomiting.  Genitourinary: Negative for dysuria.       Prostate cancer  Musculoskeletal: Positive for falls.       S/p right tib/fib distal fx in cast  Skin: Positive for itching and rash.       Bilateral groin  Neurological: Negative for dizziness, tingling, sensory change, loss of consciousness and weakness.  Endo/Heme/Allergies: Does not bruise/bleed easily.    Psychiatric/Behavioral: Negative for depression and memory loss. The patient is nervous/anxious and has insomnia.     Vitals:   05/31/16 1501  BP: (!) 144/88  Pulse: 68  Resp: 16  Temp: 98.8 F (37.1 C)  TempSrc: Oral  SpO2: 98%  Height: 5\' 6"  (1.676 m)   There is no height or weight on file to calculate BMI. Physical Exam  Constitutional: He is oriented to person, place, and time. He appears well-developed and well-nourished. No distress.  HENT:  Head: Normocephalic and atraumatic.  Right Ear: External ear normal.  Left Ear: External ear normal.  Nose:  Nose normal.  Mouth/Throat: Oropharynx is clear and moist.  Eyes: Conjunctivae and EOM are normal. Pupils are equal, round, and reactive to light. No scleral icterus.  Neck: Normal range of motion. Neck supple. No JVD present. No thyromegaly present.  Cardiovascular: Normal rate, regular rhythm, normal heart sounds and intact distal pulses.   Pulmonary/Chest: Effort normal and breath sounds normal. No respiratory distress.  Abdominal: Soft. Bowel sounds are normal. He exhibits no distension. There is no tenderness.  Musculoskeletal: Normal range of motion. He exhibits no tenderness.  Right lower leg in cast  Lymphadenopathy:    He has no cervical adenopathy.  Neurological: He is alert and oriented to person, place, and time. No cranial nerve deficit.  Skin: Skin is warm and dry. There is erythema.  Excoriation, erythema of groin and scrotum  Psychiatric: He has a normal mood and affect.    Labs reviewed: Basic Metabolic Panel:  Recent Labs  02/20/16 2313 03/22/16 1905  NA 138 138  K 4.2 4.3  CL 101 102  CO2 24 22  GLUCOSE 245* 219*  BUN 14 17  CREATININE 1.12 1.05  CALCIUM 10.0 10.0   Liver Function Tests:  Recent Labs  02/20/16 2313  AST 29  ALT 40  ALKPHOS 86  BILITOT 0.7  PROT 7.7  ALBUMIN 4.2    Recent Labs  02/20/16 2313  LIPASE 18   No results for input(s): AMMONIA in the last 8760  hours. CBC:  Recent Labs  02/20/16 2313 03/22/16 1905  WBC 11.6* 16.8*  NEUTROABS  --  13.9*  HGB 17.6* 17.9*  HCT 51.6 51.8  MCV 85.9 85.2  PLT 213 263   Cardiac Enzymes: No results for input(s): CKTOTAL, CKMB, CKMBINDEX, TROPONINI in the last 8760 hours. BNP: Invalid input(s): POCBNP Lab Results  Component Value Date   HGBA1C 8.2 (H) 03/23/2016  No results found for: VITAMINB12 No results found for: FOLATE Lab Results  Component Value Date   IRON 54 01/05/2013   FERRITIN 55.5 02/20/2012    Imaging and Procedures obtained prior to SNF admission: Dg Chest 2 View  Result Date: 03/22/2016 CLINICAL DATA:  61 year old male under preoperative evaluation prior to tibial surgery. EXAM: CHEST  2 VIEW COMPARISON:  Chest x-ray 04/18/2008. FINDINGS: Lung volumes are normal. No consolidative airspace disease. No pleural effusions. No pneumothorax. No pulmonary nodule or mass noted. Pulmonary vasculature and the cardiomediastinal silhouette are within normal limits. IMPRESSION: No radiographic evidence of acute cardiopulmonary disease. Electronically Signed   By: Vinnie Langton M.D.   On: 03/22/2016 21:14   Dg Tibia/fibula Right  Result Date: 03/22/2016 CLINICAL DATA:  Right lower leg injury and pain due to a slip and fall on ice today. Initial encounter. EXAM: RIGHT TIBIA AND FIBULA - 2 VIEW COMPARISON:  None. FINDINGS: The patient has a spiral fracture of the distal diaphysis of the right fibula with slight lateral displacement of the distal fragment. Oblique fracture of the distal diaphysis of the fibula shows slight posterior displacement of the distal fragment. No other acute bony or joint abnormality. Atherosclerosis noted. IMPRESSION: Acute distal diaphyseal fractures of the tibia and fibula. Electronically Signed   By: Inge Rise M.D.   On: 03/22/2016 18:43   Dg Ankle Complete Right  Result Date: 03/22/2016 CLINICAL DATA:  Right ankle pain due to an injury secondary to a slip  and fall on ice today. Initial encounter. EXAM: RIGHT ANKLE - COMPLETE 3+ VIEW COMPARISON:  None. FINDINGS: The patient has a spiral fracture  through the distal diaphysis of the tibia. The fracture shows slight lateral displacement of the distal fragment. There is also an oblique fracture of the distal diaphysis of the fibula with slight posterior displacement of the distal fragment. IMPRESSION: Acute distal diaphyseal fractures of the right tibia and fibula as above. Electronically Signed   By: Inge Rise M.D.   On: 03/22/2016 18:41   Ct Head Wo Contrast  Result Date: 03/22/2016 CLINICAL DATA:  [place] and hit head today.  Headache. EXAM: CT HEAD WITHOUT CONTRAST TECHNIQUE: Contiguous axial images were obtained from the base of the skull through the vertex without intravenous contrast. COMPARISON:  CT head 04/18/2008 FINDINGS: Brain: Moderate atrophy with progression. Chronic microvascular ischemic changes throughout the white matter with progression. Negative for acute infarct.  Negative for acute hemorrhage or mass. Vascular: No hyperdense vessel or unexpected calcification. Skull: Negative for fracture. Sinuses/Orbits: Negative Other: None IMPRESSION: No acute intracranial abnormality Atrophy and chronic microvascular ischemia change have progressed since 2010. Electronically Signed   By: Franchot Gallo M.D.   On: 03/22/2016 18:19    Assessment/Plan 1. Nondisplaced spiral fracture of shaft of unspecified tibia, initial encounter for closed fracture -continue on hydrocodone -cont PT  -keep f/u with [name] at the end of the month when hopefully he will be able to bear weight again  2. Type 2 diabetes mellitus without complication, without long-term current use of insulin (HCC) -cont metformin 1 g po bid with meals  3. Dyslipidemia associated with type 2 diabetes mellitus ([place]) -cont lipitor therapy  4. Benign hypertension -cont benicar-hct, lopressor  5. Prostate cancer [facility]) -f/u with  urology after he leaves rehab   6. Depressive disorder -begin lorazepam 0.5mg  po qhs prn anxiety and to help him sleep as well (reports he is lying awake thinking about things and being anxious)  7. Candidal skin infection -begin nystatin cream qid for 14 days  Family/ staff Communication: discussed with NP and pt  Labs/tests ordered:  No new at this time  Larosa Rhines L. Anaaya Fuster, D.O. [facility] [address], [place] [address] Cell Phone (Mon-Fri 8am-5pm):  [phone] On Call:  [phone] & follow prompts after 5pm & weekends Office Phone:  [phone] Office Fax:  [phone]

## 2016-06-03 DIAGNOSIS — S82255D Nondisplaced comminuted fracture of shaft of left tibia, subsequent encounter for closed fracture with routine healing: Secondary | ICD-10-CM | POA: Diagnosis not present

## 2016-06-04 ENCOUNTER — Ambulatory Visit (INDEPENDENT_AMBULATORY_CARE_PROVIDER_SITE_OTHER): Payer: Medicare Other | Admitting: Orthopedic Surgery

## 2016-06-04 DIAGNOSIS — S82255D Nondisplaced comminuted fracture of shaft of left tibia, subsequent encounter for closed fracture with routine healing: Secondary | ICD-10-CM | POA: Diagnosis not present

## 2016-06-05 DIAGNOSIS — S82255D Nondisplaced comminuted fracture of shaft of left tibia, subsequent encounter for closed fracture with routine healing: Secondary | ICD-10-CM | POA: Diagnosis not present

## 2016-06-06 DIAGNOSIS — S82255D Nondisplaced comminuted fracture of shaft of left tibia, subsequent encounter for closed fracture with routine healing: Secondary | ICD-10-CM | POA: Diagnosis not present

## 2016-06-07 DIAGNOSIS — S82255D Nondisplaced comminuted fracture of shaft of left tibia, subsequent encounter for closed fracture with routine healing: Secondary | ICD-10-CM | POA: Diagnosis not present

## 2016-06-10 DIAGNOSIS — S82255D Nondisplaced comminuted fracture of shaft of left tibia, subsequent encounter for closed fracture with routine healing: Secondary | ICD-10-CM | POA: Diagnosis not present

## 2016-06-11 DIAGNOSIS — S82255D Nondisplaced comminuted fracture of shaft of left tibia, subsequent encounter for closed fracture with routine healing: Secondary | ICD-10-CM | POA: Diagnosis not present

## 2016-06-12 ENCOUNTER — Ambulatory Visit (INDEPENDENT_AMBULATORY_CARE_PROVIDER_SITE_OTHER): Payer: Medicare Other | Admitting: Family

## 2016-06-12 ENCOUNTER — Ambulatory Visit (INDEPENDENT_AMBULATORY_CARE_PROVIDER_SITE_OTHER): Payer: Medicare Other | Admitting: Orthopedic Surgery

## 2016-06-12 DIAGNOSIS — S82255D Nondisplaced comminuted fracture of shaft of left tibia, subsequent encounter for closed fracture with routine healing: Secondary | ICD-10-CM | POA: Diagnosis not present

## 2016-06-13 ENCOUNTER — Ambulatory Visit (INDEPENDENT_AMBULATORY_CARE_PROVIDER_SITE_OTHER): Payer: No Typology Code available for payment source

## 2016-06-13 ENCOUNTER — Ambulatory Visit (INDEPENDENT_AMBULATORY_CARE_PROVIDER_SITE_OTHER): Payer: Medicare Other | Admitting: Orthopedic Surgery

## 2016-06-13 DIAGNOSIS — S82241G Displaced spiral fracture of shaft of right tibia, subsequent encounter for closed fracture with delayed healing: Secondary | ICD-10-CM

## 2016-06-13 DIAGNOSIS — S82255D Nondisplaced comminuted fracture of shaft of left tibia, subsequent encounter for closed fracture with routine healing: Secondary | ICD-10-CM | POA: Diagnosis not present

## 2016-06-13 DIAGNOSIS — S82246A Nondisplaced spiral fracture of shaft of unspecified tibia, initial encounter for closed fracture: Secondary | ICD-10-CM

## 2016-06-13 NOTE — Progress Notes (Signed)
Office Visit Note   Patient: [name]           Date of Birth: 1955/05/26           MRN: [id number] Visit Date: 06/13/2016              Requested by: [name] [address] [place], [place] [address] PCP: [name]  Chief Complaint  Patient presents with  . Right Leg - Fracture      HPI: Patient has delayed union of the right tib-fib fracture with peripheral vascular disease. Patient presents after follow-up in a short leg cast.  Assessment & Plan: Visit Diagnoses:  1. Nondisplaced spiral fracture of shaft of unspecified tibia, initial encounter for closed fracture   2. Closed displaced spiral fracture of shaft of right tibia with delayed healing     Plan: We'll place him back in a short leg cast for 3 weeks he may be touchdown weightbearing follow-up in 3 weeks with repeat 2 view radiographs of the right tib-fib at which time we will place him in a fracture boot. At follow-up patient will need to aggressively start working on ankle range of motion.  Follow-Up Instructions: Return in about 3 weeks (around 07/04/2016).   Ortho Exam  Patient is alert, oriented, no adenopathy, well-dressed, normal affect, normal respiratory effort. Patient is ambulating in a wheelchair. Examination the fracture site is nontender to palpation there is no gross motion with attempted distraction. Is no redness no cellulitis there is no tenting of the skin no deformity.  Imaging: Xr Tibia/fibula Right  Result Date: 06/13/2016 Two-view radiographs of the right tibia and fibula shows interval healing he still has calcification of the vessels in his leg there is small amount of callus formation at the fracture sites.   Labs: Lab Results  Component Value Date   HGBA1C 8.2 (H) 03/23/2016   HGBA1C 8.2 (H) 03/23/2016   LABURIC 8.0 (H) 03/25/2016    Orders:  Orders Placed This Encounter  Procedures  . XR Tibia/Fibula Right   No orders of the defined types were placed in  this encounter.    Procedures: No procedures performed  Clinical Data: No additional findings.  ROS:  All other systems negative, except as noted in the HPI. Review of Systems  Objective: Vital Signs: There were no vitals taken for this visit.  Specialty Comments:  No specialty comments available.  PMFS History: Patient Active Problem List   Diagnosis Date Noted  . Type 2 diabetes mellitus without complication, without long-term current use of insulin ([place]) 05/29/2016  . Dyslipidemia associated with type 2 diabetes mellitus ([place]) 05/29/2016  . Nondisplaced spiral fracture of shaft of unspecified tibia, initial encounter for closed fracture   . Right tibial fracture 03/22/2016  . Microcytic anemia 02/17/2012  . Prostate cancer ([place]) 02/17/2012  . 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 Benign hypertension 02/17/2012  . Depressive disorder 02/17/2012   Past Medical History:  Diagnosis Date  . Depression   . GERD (gastroesophageal reflux disease)   . Hemorrhoids   . Hypertension   . Macular degeneration   . Microcytic anemia   . Prostate cancer ([place]) 08/2009  . Tubular adenoma     Family History  Problem Relation Age of Onset  . Lung cancer Mother     Deceased  . Throat cancer Brother   . Pancreatic cancer Father   . Heart disease Father     Deceased  . Lung cancer Maternal Uncle  nephew  . Diabetes Other     Past Surgical History:  Procedure Laterality Date  . INGUINAL HERNIA REPAIR    . Left ankle joint fusion  1981  . PROSTATE BIOPSY     x 2   Social History   Occupational History  . Not on file.   Social History Main Topics  . Smoking status: Never Smoker  . Smokeless tobacco: Never Used  . Alcohol use Yes     Comment: Occasional  . Drug use: No  . Sexual activity: Not Currently

## 2016-06-14 DIAGNOSIS — S82255D Nondisplaced comminuted fracture of shaft of left tibia, subsequent encounter for closed fracture with routine healing: Secondary | ICD-10-CM | POA: Diagnosis not present

## 2016-06-17 DIAGNOSIS — S82255D Nondisplaced comminuted fracture of shaft of left tibia, subsequent encounter for closed fracture with routine healing: Secondary | ICD-10-CM | POA: Diagnosis not present

## 2016-06-18 DIAGNOSIS — S82255D Nondisplaced comminuted fracture of shaft of left tibia, subsequent encounter for closed fracture with routine healing: Secondary | ICD-10-CM | POA: Diagnosis not present

## 2016-06-19 DIAGNOSIS — S82255D Nondisplaced comminuted fracture of shaft of left tibia, subsequent encounter for closed fracture with routine healing: Secondary | ICD-10-CM | POA: Diagnosis not present

## 2016-06-20 DIAGNOSIS — S82255D Nondisplaced comminuted fracture of shaft of left tibia, subsequent encounter for closed fracture with routine healing: Secondary | ICD-10-CM | POA: Diagnosis not present

## 2016-06-21 DIAGNOSIS — F419 Anxiety disorder, unspecified: Secondary | ICD-10-CM | POA: Diagnosis not present

## 2016-06-21 DIAGNOSIS — F329 Major depressive disorder, single episode, unspecified: Secondary | ICD-10-CM | POA: Diagnosis not present

## 2016-06-21 DIAGNOSIS — S82255D Nondisplaced comminuted fracture of shaft of left tibia, subsequent encounter for closed fracture with routine healing: Secondary | ICD-10-CM | POA: Diagnosis not present

## 2016-06-24 DIAGNOSIS — S82255D Nondisplaced comminuted fracture of shaft of left tibia, subsequent encounter for closed fracture with routine healing: Secondary | ICD-10-CM | POA: Diagnosis not present

## 2016-06-25 ENCOUNTER — Encounter: Payer: Self-pay | Admitting: Adult Health

## 2016-06-25 ENCOUNTER — Non-Acute Institutional Stay (SKILLED_NURSING_FACILITY): Payer: Medicare Other | Admitting: Adult Health

## 2016-06-25 DIAGNOSIS — E119 Type 2 diabetes mellitus without complications: Secondary | ICD-10-CM

## 2016-06-25 DIAGNOSIS — S82255D Nondisplaced comminuted fracture of shaft of left tibia, subsequent encounter for closed fracture with routine healing: Secondary | ICD-10-CM | POA: Diagnosis not present

## 2016-06-25 DIAGNOSIS — E785 Hyperlipidemia, unspecified: Secondary | ICD-10-CM

## 2016-06-25 DIAGNOSIS — I10 Essential (primary) hypertension: Secondary | ICD-10-CM

## 2016-06-25 DIAGNOSIS — E1169 Type 2 diabetes mellitus with other specified complication: Secondary | ICD-10-CM | POA: Diagnosis not present

## 2016-06-25 DIAGNOSIS — S82246A Nondisplaced spiral fracture of shaft of unspecified tibia, initial encounter for closed fracture: Secondary | ICD-10-CM | POA: Diagnosis not present

## 2016-06-25 NOTE — Progress Notes (Signed)
Location:   [place] Room Number: 120 A Place of Service:  SNF (31)   CODE STATUS: Full Code  No Known Allergies  Chief Complaint  Patient presents with  . Medical Management of Chronic Issues    1 month follow up    HPI:  He is a short term resident of this facility being seen for the management of his chronic illnesses. His blood pressure is elevated and will require further treatment. He has had behavioral issues; getting physically inappropriate with staff members. We have had a discussion about being discharged as he could possibly continue his therapy at home. He has assured the staff here that he will not act out any further.     Past Medical History:  Diagnosis Date  . Depression   . GERD (gastroesophageal reflux disease)   . Hemorrhoids   . Hypertension   . Macular degeneration   . Microcytic anemia   . Prostate cancer ([place]) 08/2009  . Tubular adenoma     Past Surgical History:  Procedure Laterality Date  . INGUINAL HERNIA REPAIR    . Left ankle joint fusion  1981  . PROSTATE BIOPSY     x 2    Social History   Social History  . Marital status: Divorced    Spouse name: N/A  . Number of children: 0  . Years of education: N/A   Occupational History  . Not on file.   Social History Main Topics  . Smoking status: Never Smoker  . Smokeless tobacco: Never Used  . Alcohol use Yes     Comment: Occasional  . Drug use: No  . Sexual activity: Not Currently   Other Topics Concern  . Not on file   Social History Narrative  . No narrative on file   Family History  Problem Relation Age of Onset  . Lung cancer Mother     Deceased  . Throat cancer Brother   . Pancreatic cancer Father   . Heart disease Father     Deceased  . Lung cancer Maternal Uncle     nephew  . Diabetes Other       VITAL SIGNS BP (!) 179/100   Pulse 70   Temp 97.9 F (36.6 C)   Resp 16   Ht 5\' 6"  (1.676 m)   SpO2 98%   Patient's Medications  New  Prescriptions   No medications on file  Previous Medications   ASPIRIN EC 81 MG TABLET    Take 81 mg by mouth daily.   ATORVASTATIN (LIPITOR) 20 MG TABLET    Take 20 mg by mouth daily.   HYDROCODONE-ACETAMINOPHEN (NORCO/VICODIN) 5-325 MG TABLET    Take 1-2 tablets by mouth every 6 (six) hours as needed for moderate pain.   LORAZEPAM (ATIVAN) 0.5 MG TABLET    Take 0.5 mg by mouth daily as needed for anxiety.   LOSARTAN-HYDROCHLOROTHIAZIDE (HYZAAR) 100-25 MG TABLET    Take 1 tablet by mouth daily.   METFORMIN (GLUCOPHAGE) 1000 MG TABLET    Take 1,000 mg by mouth 2 (two) times daily with a meal.   METOPROLOL (LOPRESSOR) 50 MG TABLET    Take 50 mg by mouth daily.   MULTIPLE VITAMIN (MULTIVITAMIN) TABLET    Take 1 tablet by mouth daily.   SERTRALINE (ZOLOFT) 25 MG TABLET    Take 1 tablet by mouth in the evening for anxiety   SERTRALINE (ZOLOFT) 50 MG TABLET    Take 1 tablet by mouth  in the evening for anxiety  Modified Medications   No medications on file  Discontinued Medications   OLMESARTAN-HYDROCHLOROTHIAZIDE (BENICAR HCT) 40-25 MG TABLET    Take 1 tablet by mouth daily.     SIGNIFICANT DIAGNOSTIC EXAMS  03-22-16: right ankle x-ray: Acute distal diaphyseal fractures of the right tibia and fibula as  Above.  03-22-16: right tibia/fibula x-ray: Acute distal diaphyseal fractures of the tibia and fibula.  04-17-16: right tibia/fibula x-ray: Two-view radiographs of the right tibia and fibula show stable alignment  in both AP and lateral planes there is no extension in the lateral view no  shortening in the AP view there is some mild callus formation.  05-22-16: right tibia/fibula x-ray: 2 view radiographs of the right tibia and ankle show some varus  displacement. Patient states that he has been weightbearing. There is also  displacement of the medial malleolar fracture as well. Radiographs do show  calcification of popliteal vessels all the way down to the ankle.   LABS REVIEWED:   03-22-16: wbc  16.8; hgb 17.9; hct 51.8; mcv 85.2; plt 263; glucose 219; bun 17; creat 1.05; k+ 4.3; na++ 138; hgb a1c 8.2 03-25-16: uric acid 8.0   Review of Systems  Constitutional: Negative for malaise/fatigue.  Respiratory: Negative for cough and shortness of breath.   Cardiovascular: Negative for chest pain, palpitations and leg swelling.  Gastrointestinal: Negative for abdominal pain, constipation and heartburn.  Musculoskeletal: Positive for joint pain. Negative for back pain and myalgias.       Right ankle pain   Skin: Negative.   Neurological: Negative for dizziness.  Psychiatric/Behavioral: The patient is not nervous/anxious.     Physical Exam  Constitutional: He is oriented to person, place, and time. No distress.  Eyes: Conjunctivae are normal.  Neck: Neck supple. No JVD present. No thyromegaly present.  Cardiovascular: Normal rate, regular rhythm and intact distal pulses.   Respiratory: Effort normal and breath sounds normal. No respiratory distress. He has no wheezes.  GI: Soft. Bowel sounds are normal. He exhibits no distension. There is no tenderness.  Musculoskeletal: He exhibits no edema.  Able to move all extremities  Right ankle in hard cast   Lymphadenopathy:    He has no cervical adenopathy.  Neurological: He is alert and oriented to person, place, and time.  Skin: Skin is warm and dry. He is not diaphoretic.  Psychiatric: He has a normal mood and affect.      ASSESSMENT/ PLAN:  1. Hypertension: will continue lopressor 50 mg daily benicar hct 40/25 mg daily and asa 81 mg daily   Will begin norvasc 5 mg daily and will monitor his status.   2. Dyslipidemia: will continue lipitor 20 mg daily   3. Diabetes: hgb a1c 8.2; will continue metformin 1 gm twice daily   4. Spiral fracture of shaft of tibia: is followed by ortho. Will continue therapy as directed.  Will continue  vicodin 5/325 mg 1 or 2 tabs every 6 hours as needed. Will monitor   Will check hgb a1c      Ok Edwards NP [facility]  Contact [phone] Monday through Friday 8am- 5pm  After hours call [phone]

## 2016-06-26 DIAGNOSIS — S82255D Nondisplaced comminuted fracture of shaft of left tibia, subsequent encounter for closed fracture with routine healing: Secondary | ICD-10-CM | POA: Diagnosis not present

## 2016-06-26 LAB — HEMOGLOBIN A1C
HEMOGLOBIN A1C: 7.6
Hemoglobin A1C: 7.6

## 2016-06-27 DIAGNOSIS — S82255D Nondisplaced comminuted fracture of shaft of left tibia, subsequent encounter for closed fracture with routine healing: Secondary | ICD-10-CM | POA: Diagnosis not present

## 2016-06-28 DIAGNOSIS — S82255D Nondisplaced comminuted fracture of shaft of left tibia, subsequent encounter for closed fracture with routine healing: Secondary | ICD-10-CM | POA: Diagnosis not present

## 2016-07-01 ENCOUNTER — Other Ambulatory Visit: Payer: Self-pay | Admitting: *Deleted

## 2016-07-01 DIAGNOSIS — S82255D Nondisplaced comminuted fracture of shaft of left tibia, subsequent encounter for closed fracture with routine healing: Secondary | ICD-10-CM | POA: Diagnosis not present

## 2016-07-01 MED ORDER — HYDROCODONE-ACETAMINOPHEN 5-325 MG PO TABS: ORAL_TABLET | ORAL | 0 refills | Status: DC

## 2016-07-01 NOTE — Telephone Encounter (Signed)
[facility] #[phone] Fax:[phone]  

## 2016-07-02 DIAGNOSIS — S82255D Nondisplaced comminuted fracture of shaft of left tibia, subsequent encounter for closed fracture with routine healing: Secondary | ICD-10-CM | POA: Diagnosis not present

## 2016-07-03 ENCOUNTER — Ambulatory Visit (INDEPENDENT_AMBULATORY_CARE_PROVIDER_SITE_OTHER): Payer: Medicare Other | Admitting: Orthopedic Surgery

## 2016-07-03 DIAGNOSIS — S82255D Nondisplaced comminuted fracture of shaft of left tibia, subsequent encounter for closed fracture with routine healing: Secondary | ICD-10-CM | POA: Diagnosis not present

## 2016-07-03 DIAGNOSIS — F419 Anxiety disorder, unspecified: Secondary | ICD-10-CM | POA: Diagnosis not present

## 2016-07-03 DIAGNOSIS — F329 Major depressive disorder, single episode, unspecified: Secondary | ICD-10-CM | POA: Diagnosis not present

## 2016-07-04 ENCOUNTER — Encounter (INDEPENDENT_AMBULATORY_CARE_PROVIDER_SITE_OTHER): Payer: Self-pay | Admitting: Orthopedic Surgery

## 2016-07-04 ENCOUNTER — Ambulatory Visit (INDEPENDENT_AMBULATORY_CARE_PROVIDER_SITE_OTHER): Payer: Medicare Other | Admitting: Orthopedic Surgery

## 2016-07-04 ENCOUNTER — Ambulatory Visit (INDEPENDENT_AMBULATORY_CARE_PROVIDER_SITE_OTHER): Payer: Medicare Other

## 2016-07-04 ENCOUNTER — Ambulatory Visit (INDEPENDENT_AMBULATORY_CARE_PROVIDER_SITE_OTHER): Payer: Medicare Other | Admitting: Family

## 2016-07-04 VITALS — Ht 66.0 in | Wt 190.0 lb

## 2016-07-04 DIAGNOSIS — S82244D Nondisplaced spiral fracture of shaft of right tibia, subsequent encounter for closed fracture with routine healing: Secondary | ICD-10-CM | POA: Diagnosis not present

## 2016-07-04 DIAGNOSIS — S82255D Nondisplaced comminuted fracture of shaft of left tibia, subsequent encounter for closed fracture with routine healing: Secondary | ICD-10-CM | POA: Diagnosis not present

## 2016-07-04 NOTE — Progress Notes (Signed)
Office Visit Note   Patient: [name]           Date of Birth: Jul 24, 1955           MRN: [id number] Visit Date: 07/04/2016              Requested by: [name] [address] [place], [place] [address] PCP: [name]  Chief Complaint  Patient presents with  . Right Leg - Follow-up    Right nondisplaced spiral tib-fib fracture      HPI: The patient is a 61 year old gentleman who presents today in follow-up for spiral tib-fib fracture on the right. He has been in a cast for the last 3 weeks. States he has been nonweightbearing. Is residing in skilled nursing. Is nonweightbearing in a wheelchair. No complaints of pain or irritation from the cast.  Assessment & Plan: Visit Diagnoses:  1. Closed nondisplaced spiral fracture of shaft of right tibia with routine healing, subsequent encounter     Plan: We'll place him in a cam walking boot today. We'll be nonweightbearing in the fracture boot. Follow-up in office in 3 more weeks with repeat radiographs.  Follow-Up Instructions: Return in about 3 weeks (around 07/25/2016).   Ortho Exam  Patient is alert, oriented, no adenopathy, well-dressed, normal affect, normal respiratory effort. Examination of the right lower extremity. There is atrophy. The skin is intact there is no erythema no swelling.  Imaging: No results found.  Labs: Lab Results  Component Value Date   HGBA1C 7.6 06/26/2016   HGBA1C 7.6 06/26/2016   HGBA1C 8.2 (H) 03/23/2016   LABURIC 8.0 (H) 03/25/2016    Orders:  Orders Placed This Encounter  Procedures  . XR Tibia/Fibula Right   No orders of the defined types were placed in this encounter.    Procedures: No procedures performed  Clinical Data: No additional findings.  ROS:  All other systems negative, except as noted in the HPI. Review of Systems  Constitutional: Negative for chills and fever.  Cardiovascular: Negative for leg swelling.    Objective: Vital Signs: Ht 5'  6" (1.676 m)   Wt 190 lb (86.2 kg)   BMI 30.67 kg/m   Specialty Comments:  No specialty comments available.  PMFS History: Patient Active Problem List   Diagnosis Date Noted  . Type 2 diabetes mellitus without complication, without long-term current use of insulin ([place]) 05/29/2016  . Dyslipidemia associated with type 2 diabetes mellitus ([place]) 05/29/2016  . Nondisplaced spiral fracture of shaft of unspecified tibia, initial encounter for closed fracture   . Right tibial fracture 03/22/2016  . Microcytic anemia 02/17/2012  . Prostate cancer ([place]) 02/17/2012  . 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 Benign hypertension 02/17/2012  . Depressive disorder 02/17/2012   Past Medical History:  Diagnosis Date  . Depression   . GERD (gastroesophageal reflux disease)   . Hemorrhoids   . Hypertension   . Macular degeneration   . Microcytic anemia   . Prostate cancer ([place]) 08/2009  . Tubular adenoma     Family History  Problem Relation Age of Onset  . Lung cancer Mother     Deceased  . Throat cancer Brother   . Pancreatic cancer Father   . Heart disease Father     Deceased  . Lung cancer Maternal Uncle     nephew  . Diabetes Other     Past Surgical History:  Procedure Laterality Date  . INGUINAL HERNIA REPAIR    . Left  ankle joint fusion  1981  . PROSTATE BIOPSY     x 2   Social History   Occupational History  . Not on file.   Social History Main Topics  . Smoking status: Never Smoker  . Smokeless tobacco: Never Used  . Alcohol use Yes     Comment: Occasional  . Drug use: No  . Sexual activity: Not Currently

## 2016-07-05 DIAGNOSIS — S82255D Nondisplaced comminuted fracture of shaft of left tibia, subsequent encounter for closed fracture with routine healing: Secondary | ICD-10-CM | POA: Diagnosis not present

## 2016-07-08 DIAGNOSIS — S82255D Nondisplaced comminuted fracture of shaft of left tibia, subsequent encounter for closed fracture with routine healing: Secondary | ICD-10-CM | POA: Diagnosis not present

## 2016-07-09 DIAGNOSIS — S82255D Nondisplaced comminuted fracture of shaft of left tibia, subsequent encounter for closed fracture with routine healing: Secondary | ICD-10-CM | POA: Diagnosis not present

## 2016-07-10 DIAGNOSIS — S82255D Nondisplaced comminuted fracture of shaft of left tibia, subsequent encounter for closed fracture with routine healing: Secondary | ICD-10-CM | POA: Diagnosis not present

## 2016-07-11 ENCOUNTER — Telehealth (INDEPENDENT_AMBULATORY_CARE_PROVIDER_SITE_OTHER): Payer: Self-pay | Admitting: Orthopedic Surgery

## 2016-07-11 NOTE — Telephone Encounter (Signed)
Pt called and wanted to know if he can start walking a little earlier and if so would it do any major damage. Requested a returned call please.  [phone]

## 2016-07-12 NOTE — Telephone Encounter (Signed)
IC patient and LMVM to continue NWB in fx boot as directed at last visit.

## 2016-07-17 ENCOUNTER — Telehealth (INDEPENDENT_AMBULATORY_CARE_PROVIDER_SITE_OTHER): Payer: Self-pay | Admitting: Family

## 2016-07-17 NOTE — Telephone Encounter (Signed)
Patient called asked if he can put any weight on his Rt leg since he has the walking boot. Patient said he is still non-weight bearing... Patient is concerned about the Fx line in his leg. The number to contact patient is [phone]

## 2016-07-17 NOTE — Telephone Encounter (Signed)
I called and spoke with patient to advise that he is to remain nonweightbearing until his follow up appointment on 07/25/16. Weightbearing can cause worsening of his current fracture and to please remain with the boot on nonweightbearing. Patient expressed understanding.

## 2016-07-24 ENCOUNTER — Encounter: Payer: Self-pay | Admitting: Adult Health

## 2016-07-24 ENCOUNTER — Non-Acute Institutional Stay (SKILLED_NURSING_FACILITY): Payer: Medicare Other | Admitting: Adult Health

## 2016-07-24 DIAGNOSIS — I10 Essential (primary) hypertension: Secondary | ICD-10-CM

## 2016-07-24 DIAGNOSIS — S82244S Nondisplaced spiral fracture of shaft of right tibia, sequela: Secondary | ICD-10-CM

## 2016-07-24 DIAGNOSIS — F329 Major depressive disorder, single episode, unspecified: Secondary | ICD-10-CM | POA: Diagnosis not present

## 2016-07-24 DIAGNOSIS — E785 Hyperlipidemia, unspecified: Secondary | ICD-10-CM

## 2016-07-24 DIAGNOSIS — E1169 Type 2 diabetes mellitus with other specified complication: Secondary | ICD-10-CM | POA: Diagnosis not present

## 2016-07-24 DIAGNOSIS — E1165 Type 2 diabetes mellitus with hyperglycemia: Secondary | ICD-10-CM | POA: Diagnosis not present

## 2016-07-24 DIAGNOSIS — F32A Depression, unspecified: Secondary | ICD-10-CM

## 2016-07-24 NOTE — Progress Notes (Signed)
Location:   [name] Room Number: 120 A Place of Service:  SNF (31)   CODE STATUS: Full Code  No Known Allergies  Chief Complaint  Patient presents with  . Medical Management of Chronic Issues    1 month follow up    HPI:  He is a short term resident of this facility being seen for the management of his chorinc illnesses. His cast is off and he is now in a camboot. He tells me that he is feeling good and has no complaints. There are no nursing concerns at this time.    Past Medical History:  Diagnosis Date  . Depression   . GERD (gastroesophageal reflux disease)   . Hemorrhoids   . Hypertension   . Macular degeneration   . Microcytic anemia   . Prostate cancer ([place]) 08/2009  . Tubular adenoma     Past Surgical History:  Procedure Laterality Date  . CYSTOSCOPY WITH RETROGRADE PYELOGRAM, URETEROSCOPY AND STENT PLACEMENT Right 07/31/2016   Procedure: CYSTOSCOPY WITH RIGHT  RETROGRADE PYELOGRAM, URETEROSCOPY;  Surgeon: [name], MD;  Location: [facility];  Service: Urology;  Laterality: Right;  . INGUINAL HERNIA REPAIR    . Left ankle joint fusion  1981  . PROSTATE BIOPSY     x 2  . URETERAL REIMPLANTION  07/31/2016   Procedure: URETERAL REIMPLANT, right boari flap, right psoas hitch;  Surgeon: [name], MD;  Location: [facility];  Service: Urology;;    Social History   Social History  . Marital status: Divorced    Spouse name: N/A  . Number of children: 0  . Years of education: N/A   Occupational History  . Not on file.   Social History Main Topics  . Smoking status: Never Smoker  . Smokeless tobacco: Never Used  . Alcohol use Yes     Comment: Occasional  . Drug use: No  . Sexual activity: Not Currently   Other Topics Concern  . Not on file   Social History Narrative  . No narrative on file   Family History  Problem Relation Age of Onset  . Lung cancer Mother        Deceased  . Throat cancer Brother   . Pancreatic  cancer Father   . Heart disease Father        Deceased  . Lung cancer Maternal Uncle        nephew  . Diabetes Other       VITAL SIGNS BP (!) 151/100   Pulse (!) 104   Temp 98 F (36.7 C)   Resp 20   Ht 5\' 6"  (1.676 m)   Wt 190 lb (86.2 kg)   SpO2 97%   BMI 30.67 kg/m   Patient's Medications  New Prescriptions   No medications on file  Previous Medications   AMLODIPINE (NORVASC) 5 MG TABLET    Take 5 mg by mouth daily.   ASPIRIN EC 81 MG TABLET    Take 81 mg by mouth daily.   ATORVASTATIN (LIPITOR) 20 MG TABLET    Take 20 mg by mouth daily.   HYDROCODONE-ACETAMINOPHEN (NORCO/VICODIN) 5-325 MG TABLET    Take one to two tablets by mouth every 6 hours as needed for pain   LORAZEPAM (ATIVAN) 0.5 MG TABLET    Take 0.5 mg by mouth at bedtime as needed for anxiety.    LOSARTAN-HYDROCHLOROTHIAZIDE (HYZAAR) 100-25 MG TABLET    Take 1 tablet by mouth daily.  METFORMIN (GLUCOPHAGE) 1000 MG TABLET    Take 1,000 mg by mouth 2 (two) times daily with a meal.   METOPROLOL (LOPRESSOR) 50 MG TABLET    Take 50 mg by mouth daily.   MULTIPLE VITAMIN (MULTIVITAMIN) TABLET    Take 1 tablet by mouth daily.   SERTRALINE (ZOLOFT) 50 MG TABLET    Take 1 tablet by mouth in the evening for anxiety  Modified Medications   No medications on file  Discontinued Medications   SERTRALINE (ZOLOFT) 25 MG TABLET    Take 1 tablet by mouth in the evening for anxiety     SIGNIFICANT DIAGNOSTIC EXAMS  03-22-16: right ankle x-ray: Acute distal diaphyseal fractures of the right tibia and fibula as  Above.  03-22-16: right tibia/fibula x-ray: Acute distal diaphyseal fractures of the tibia and fibula.  04-17-16: right tibia/fibula x-ray: Two-view radiographs of the right tibia and fibula show stable alignment  in both AP and lateral planes there is no extension in the lateral view no  shortening in the AP view there is some mild callus formation.  05-22-16: right tibia/fibula x-ray: 2 view radiographs of the right  tibia and ankle show some varus  displacement. Patient states that he has been weightbearing. There is also  displacement of the medial malleolar fracture as well. Radiographs do show  calcification of popliteal vessels all the way down to the ankle.   LABS REVIEWED:   03-22-16: wbc 16.8; hgb 17.9; hct 51.8; mcv 85.2; plt 263; glucose 219; bun 17; creat 1.05; k+ 4.3; na++ 138; hgb a1c 8.2 03-25-16: uric acid 8.0 06-26-16: hgb a1c 7.6    Review of Systems  Constitutional: Negative for malaise/fatigue.  Respiratory: Negative for cough and shortness of breath.   Cardiovascular: Negative for chest pain, palpitations and leg swelling.  Gastrointestinal: Negative for abdominal pain, constipation and heartburn.  Musculoskeletal: Positive for joint pain. Negative for back pain and myalgias.       Right ankle pain is management    Skin: Negative.   Neurological: Negative for dizziness.  Psychiatric/Behavioral: The patient is not nervous/anxious.     Physical Exam  Constitutional: He is oriented to person, place, and time. No distress.  Eyes: Conjunctivae are normal.  Neck: Neck supple. No JVD present. No thyromegaly present.  Cardiovascular: Normal rate, regular rhythm and intact distal pulses.   Respiratory: Effort normal and breath sounds normal. No respiratory distress. He has no wheezes.  GI: Soft. Bowel sounds are normal. He exhibits no distension. There is no tenderness.  Musculoskeletal: He exhibits no edema.  Able to move all extremities  Right ankle in camboot  Lymphadenopathy:    He has no cervical adenopathy.  Neurological: He is alert and oriented to person, place, and time.  Skin: Skin is warm and dry. He is not diaphoretic.  Psychiatric: He has a normal mood and affect.      ASSESSMENT/ PLAN:  1. Hypertension: will continue lopressor 50 mg daily benicar hct 40/25 mg daily and asa 81 mg daily   norvasc  5 mg daily and will monitor his status.   2. Dyslipidemia: will  continue lipitor 20 mg daily   3. Diabetes: hgb a1c 8.2; will continue metformin 1 gm twice daily   4. Spiral fracture of shaft of tibia: is followed by ortho. Will continue therapy as directed.  Will continue  vicodin 5/325 mg 1 or 2 tabs every 6 hours as needed. Will monitor   5. Depression will continue zoloft 50 mg  daily   6. 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 We will attempt to wean resident as apropriate     Ok Edwards NP [facility]  Contact [phone] Monday through Friday 8am- 5pm  After hours call [phone]

## 2016-07-25 ENCOUNTER — Ambulatory Visit (INDEPENDENT_AMBULATORY_CARE_PROVIDER_SITE_OTHER): Payer: Medicare Other

## 2016-07-25 ENCOUNTER — Encounter (INDEPENDENT_AMBULATORY_CARE_PROVIDER_SITE_OTHER): Payer: Self-pay | Admitting: Orthopedic Surgery

## 2016-07-25 ENCOUNTER — Ambulatory Visit (INDEPENDENT_AMBULATORY_CARE_PROVIDER_SITE_OTHER): Payer: Medicare Other | Admitting: Orthopedic Surgery

## 2016-07-25 VITALS — Ht 66.0 in | Wt 190.0 lb

## 2016-07-25 DIAGNOSIS — M79604 Pain in right leg: Secondary | ICD-10-CM | POA: Diagnosis not present

## 2016-07-25 DIAGNOSIS — S82244D Nondisplaced spiral fracture of shaft of right tibia, subsequent encounter for closed fracture with routine healing: Secondary | ICD-10-CM | POA: Diagnosis not present

## 2016-07-25 NOTE — Progress Notes (Signed)
Office Visit Note   Patient: [name]           Date of Birth: 13-Sep-1955           MRN: [id number] Visit Date: 07/25/2016              Requested by: [name] [address] [place], [place] [address] PCP: [name]  Chief Complaint  Patient presents with  . Right Leg - Follow-up, Fracture      HPI: Patient presents in follow-up status post spiral nondisplaced right tib-fib fracture. Patient has no complaints with weight-bearing with his fracture boot at this time he is using a walker.  Assessment & Plan: Visit Diagnoses:  1. Pain in right leg   2. Closed nondisplaced spiral fracture of shaft of right tibia with routine healing, subsequent encounter     Plan: Recommended he slowly increase his weightbearing as tolerated using his walker. If he has increased pain he needs to back off and go back to touchdown weightbearing.  Follow-up in 4 weeks with repeat 2 view radiographs of the right tibia and fibula.  Follow-Up Instructions: Return in about 4 weeks (around 08/22/2016).   Ortho Exam  Patient is alert, oriented, no adenopathy, well-dressed, normal affect, normal respiratory effort. Examination patient is currently ambulating with a walker touchdown weightbearing. Examination the fracture site is nontender to palpation there is no skin breakdown no cellulitis no open wounds.  Imaging: Xr Tibia/fibula Right  Result Date: 07/25/2016 2 view radiographs of the right tibia and fibula shows no further displacement there is callus around the fracture site. The fracture lines are less defined showing interval healing.   Labs: Lab Results  Component Value Date   HGBA1C 7.6 06/26/2016   HGBA1C 7.6 06/26/2016   HGBA1C 8.2 (H) 03/23/2016   LABURIC 8.0 (H) 03/25/2016    Orders:  Orders Placed This Encounter  Procedures  . XR Tibia/Fibula Right   No orders of the defined types were placed in this encounter.    Procedures: No procedures  performed  Clinical Data: No additional findings.  ROS:  All other systems negative, except as noted in the HPI. Review of Systems  Objective: Vital Signs: Ht 5\' 6"  (1.676 m)   Wt 190 lb (86.2 kg)   BMI 30.67 kg/m   Specialty Comments:  No specialty comments available.  PMFS History: Patient Active Problem List   Diagnosis Date Noted  . Type 2 diabetes mellitus without complication, without long-term current use of insulin ([place]) 05/29/2016  . Dyslipidemia associated with type 2 diabetes mellitus ([place]) 05/29/2016  . Closed nondisplaced spiral fracture of shaft of right tibia   . Right tibial fracture 03/22/2016  . Microcytic anemia 02/17/2012  . Prostate cancer ([place]) 02/17/2012  . 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 Benign hypertension 02/17/2012  . Depressive disorder 02/17/2012   Past Medical History:  Diagnosis Date  . Depression   . GERD (gastroesophageal reflux disease)   . Hemorrhoids   . Hypertension   . Macular degeneration   . Microcytic anemia   . Prostate cancer ([place]) 08/2009  . Tubular adenoma     Family History  Problem Relation Age of Onset  . Lung cancer Mother        Deceased  . Throat cancer Brother   . Pancreatic cancer Father   . Heart disease Father        Deceased  . Lung cancer Maternal Uncle  nephew  . Diabetes Other     Past Surgical History:  Procedure Laterality Date  . INGUINAL HERNIA REPAIR    . Left ankle joint fusion  1981  . PROSTATE BIOPSY     x 2   Social History   Occupational History  . Not on file.   Social History Main Topics  . Smoking status: Never Smoker  . Smokeless tobacco: Never Used  . Alcohol use Yes     Comment: Occasional  . Drug use: No  . Sexual activity: Not Currently

## 2016-07-26 ENCOUNTER — Telehealth (INDEPENDENT_AMBULATORY_CARE_PROVIDER_SITE_OTHER): Payer: Self-pay | Admitting: Orthopedic Surgery

## 2016-07-26 DIAGNOSIS — S82255D Nondisplaced comminuted fracture of shaft of left tibia, subsequent encounter for closed fracture with routine healing: Secondary | ICD-10-CM | POA: Diagnosis not present

## 2016-07-26 NOTE — Telephone Encounter (Signed)
[name] (PT) with [facility] called needing clarification on PT orders. The number to contact Sharyn Lull is [phone]

## 2016-07-26 NOTE — Telephone Encounter (Signed)
Called and sw PT to advise that he can be weightbearing with walker and if he is having pain then to decrease activity and go back to touch down weightbearing .

## 2016-07-29 DIAGNOSIS — S82255D Nondisplaced comminuted fracture of shaft of left tibia, subsequent encounter for closed fracture with routine healing: Secondary | ICD-10-CM | POA: Diagnosis not present

## 2016-07-30 DIAGNOSIS — S82255D Nondisplaced comminuted fracture of shaft of left tibia, subsequent encounter for closed fracture with routine healing: Secondary | ICD-10-CM | POA: Diagnosis not present

## 2016-07-31 ENCOUNTER — Emergency Department (HOSPITAL_COMMUNITY): Payer: Medicare Other

## 2016-07-31 ENCOUNTER — Inpatient Hospital Stay (HOSPITAL_COMMUNITY)
Admission: EM | Admit: 2016-07-31 | Discharge: 2016-08-08 | DRG: 660 | Disposition: A | Discharge status: Skilled Nursing Facility | Payer: Medicare Other | Attending: Urology | Admitting: Urology

## 2016-07-31 ENCOUNTER — Observation Stay (HOSPITAL_COMMUNITY): Payer: Medicare Other

## 2016-07-31 ENCOUNTER — Encounter (HOSPITAL_COMMUNITY): Payer: Self-pay | Admitting: Nurse Practitioner

## 2016-07-31 ENCOUNTER — Observation Stay (HOSPITAL_COMMUNITY): Payer: Medicare Other | Admitting: Anesthesiology

## 2016-07-31 ENCOUNTER — Encounter (HOSPITAL_COMMUNITY)
Admission: EM | Disposition: A | Discharge status: Skilled Nursing Facility | Payer: Self-pay | Source: Home / Self Care | Attending: Urology

## 2016-07-31 DIAGNOSIS — E1165 Type 2 diabetes mellitus with hyperglycemia: Secondary | ICD-10-CM | POA: Diagnosis present

## 2016-07-31 DIAGNOSIS — E669 Obesity, unspecified: Secondary | ICD-10-CM | POA: Diagnosis not present

## 2016-07-31 DIAGNOSIS — I10 Essential (primary) hypertension: Secondary | ICD-10-CM | POA: Diagnosis present

## 2016-07-31 DIAGNOSIS — C61 Malignant neoplasm of prostate: Secondary | ICD-10-CM | POA: Diagnosis not present

## 2016-07-31 DIAGNOSIS — Z79899 Other long term (current) drug therapy: Secondary | ICD-10-CM

## 2016-07-31 DIAGNOSIS — Y838 Other surgical procedures as the cause of abnormal reaction of the patient, or of later complication, without mention of misadventure at the time of the procedure: Secondary | ICD-10-CM | POA: Diagnosis not present

## 2016-07-31 DIAGNOSIS — N200 Calculus of kidney: Secondary | ICD-10-CM

## 2016-07-31 DIAGNOSIS — S3719XA Other injury of ureter, initial encounter: Secondary | ICD-10-CM | POA: Diagnosis not present

## 2016-07-31 DIAGNOSIS — R935 Abnormal findings on diagnostic imaging of other abdominal regions, including retroperitoneum: Secondary | ICD-10-CM

## 2016-07-31 DIAGNOSIS — D72829 Elevated white blood cell count, unspecified: Secondary | ICD-10-CM | POA: Diagnosis not present

## 2016-07-31 DIAGNOSIS — D509 Iron deficiency anemia, unspecified: Secondary | ICD-10-CM | POA: Diagnosis present

## 2016-07-31 DIAGNOSIS — Z6832 Body mass index (BMI) 32.0-32.9, adult: Secondary | ICD-10-CM

## 2016-07-31 DIAGNOSIS — N202 Calculus of kidney with calculus of ureter: Secondary | ICD-10-CM | POA: Diagnosis not present

## 2016-07-31 DIAGNOSIS — N135 Crossing vessel and stricture of ureter without hydronephrosis: Secondary | ICD-10-CM | POA: Diagnosis not present

## 2016-07-31 DIAGNOSIS — Z833 Family history of diabetes mellitus: Secondary | ICD-10-CM

## 2016-07-31 DIAGNOSIS — R1031 Right lower quadrant pain: Secondary | ICD-10-CM | POA: Diagnosis not present

## 2016-07-31 DIAGNOSIS — D62 Acute posthemorrhagic anemia: Secondary | ICD-10-CM | POA: Diagnosis not present

## 2016-07-31 DIAGNOSIS — K625 Hemorrhage of anus and rectum: Secondary | ICD-10-CM | POA: Diagnosis present

## 2016-07-31 DIAGNOSIS — K9189 Other postprocedural complications and disorders of digestive system: Secondary | ICD-10-CM | POA: Diagnosis not present

## 2016-07-31 DIAGNOSIS — N2889 Other specified disorders of kidney and ureter: Secondary | ICD-10-CM | POA: Diagnosis not present

## 2016-07-31 DIAGNOSIS — N201 Calculus of ureter: Secondary | ICD-10-CM | POA: Insufficient documentation

## 2016-07-31 DIAGNOSIS — K219 Gastro-esophageal reflux disease without esophagitis: Secondary | ICD-10-CM | POA: Diagnosis present

## 2016-07-31 DIAGNOSIS — N209 Urinary calculus, unspecified: Secondary | ICD-10-CM | POA: Diagnosis present

## 2016-07-31 DIAGNOSIS — Z8249 Family history of ischemic heart disease and other diseases of the circulatory system: Secondary | ICD-10-CM

## 2016-07-31 DIAGNOSIS — K567 Ileus, unspecified: Secondary | ICD-10-CM | POA: Diagnosis not present

## 2016-07-31 DIAGNOSIS — Z801 Family history of malignant neoplasm of trachea, bronchus and lung: Secondary | ICD-10-CM

## 2016-07-31 DIAGNOSIS — H3530 Unspecified macular degeneration: Secondary | ICD-10-CM | POA: Diagnosis present

## 2016-07-31 DIAGNOSIS — N1330 Unspecified hydronephrosis: Secondary | ICD-10-CM

## 2016-07-31 DIAGNOSIS — R1030 Lower abdominal pain, unspecified: Secondary | ICD-10-CM | POA: Diagnosis not present

## 2016-07-31 DIAGNOSIS — Y92234 Operating room of hospital as the place of occurrence of the external cause: Secondary | ICD-10-CM | POA: Diagnosis not present

## 2016-07-31 DIAGNOSIS — N132 Hydronephrosis with renal and ureteral calculous obstruction: Secondary | ICD-10-CM | POA: Diagnosis not present

## 2016-07-31 DIAGNOSIS — Z8546 Personal history of malignant neoplasm of prostate: Secondary | ICD-10-CM

## 2016-07-31 DIAGNOSIS — E785 Hyperlipidemia, unspecified: Secondary | ICD-10-CM | POA: Diagnosis present

## 2016-07-31 DIAGNOSIS — R112 Nausea with vomiting, unspecified: Secondary | ICD-10-CM | POA: Diagnosis not present

## 2016-07-31 DIAGNOSIS — K5900 Constipation, unspecified: Secondary | ICD-10-CM | POA: Diagnosis present

## 2016-07-31 DIAGNOSIS — Z7984 Long term (current) use of oral hypoglycemic drugs: Secondary | ICD-10-CM

## 2016-07-31 DIAGNOSIS — Z808 Family history of malignant neoplasm of other organs or systems: Secondary | ICD-10-CM

## 2016-07-31 DIAGNOSIS — K649 Unspecified hemorrhoids: Secondary | ICD-10-CM | POA: Diagnosis present

## 2016-07-31 DIAGNOSIS — K922 Gastrointestinal hemorrhage, unspecified: Secondary | ICD-10-CM | POA: Diagnosis not present

## 2016-07-31 DIAGNOSIS — Z7982 Long term (current) use of aspirin: Secondary | ICD-10-CM

## 2016-07-31 DIAGNOSIS — S3710XD Unspecified injury of ureter, subsequent encounter: Secondary | ICD-10-CM

## 2016-07-31 DIAGNOSIS — F329 Major depressive disorder, single episode, unspecified: Secondary | ICD-10-CM | POA: Diagnosis present

## 2016-07-31 DIAGNOSIS — R05 Cough: Secondary | ICD-10-CM | POA: Diagnosis not present

## 2016-07-31 DIAGNOSIS — Z800 Family history of malignant neoplasm of digestive organs: Secondary | ICD-10-CM

## 2016-07-31 HISTORY — PX: CYSTOSCOPY WITH RETROGRADE PYELOGRAM, URETEROSCOPY AND STENT PLACEMENT: SHX5789

## 2016-07-31 HISTORY — DX: Hemorrhage of anus and rectum: K62.5

## 2016-07-31 HISTORY — PX: URETERAL REIMPLANTION: SHX2611

## 2016-07-31 LAB — CBC
HEMATOCRIT: 41.2 % (ref 39.0–52.0)
HEMATOCRIT: 44.5 % (ref 39.0–52.0)
HEMOGLOBIN: 13.6 g/dL (ref 13.0–17.0)
HEMOGLOBIN: 15.8 g/dL (ref 13.0–17.0)
MCH: 30.3 pg (ref 26.0–34.0)
MCH: 28.5 pg (ref 26.0–34.0)
MCHC: 35.5 g/dL (ref 30.0–36.0)
MCHC: 33 g/dL (ref 30.0–36.0)
MCV: 85.2 fL (ref 78.0–100.0)
MCV: 86.4 fL (ref 78.0–100.0)
PLATELETS: 288 10*3/uL (ref 150–400)
Platelets: 249 10*3/uL (ref 150–400)
RBC: 5.22 MIL/uL (ref 4.22–5.81)
RBC: 4.77 MIL/uL (ref 4.22–5.81)
RDW: 13.7 % (ref 11.5–15.5)
RDW: 13.7 % (ref 11.5–15.5)
WBC: 12.9 10*3/uL — AB (ref 4.0–10.5)
WBC: 9.1 10*3/uL (ref 4.0–10.5)

## 2016-07-31 LAB — COMPREHENSIVE METABOLIC PANEL
ALT: 43 U/L (ref 17–63)
AST: 34 U/L (ref 15–41)
Albumin: 4.2 g/dL (ref 3.5–5.0)
Alkaline Phosphatase: 95 U/L (ref 38–126)
Anion gap: 13 (ref 5–15)
BUN: 22 mg/dL — ABNORMAL HIGH (ref 6–20)
CHLORIDE: 100 mmol/L — AB (ref 101–111)
CO2: 23 mmol/L (ref 22–32)
Calcium: 9.8 mg/dL (ref 8.9–10.3)
Creatinine, Ser: 1.22 mg/dL (ref 0.61–1.24)
Glucose, Bld: 253 mg/dL — ABNORMAL HIGH (ref 65–99)
POTASSIUM: 3.8 mmol/L (ref 3.5–5.1)
SODIUM: 136 mmol/L (ref 135–145)
Total Bilirubin: 0.6 mg/dL (ref 0.3–1.2)
Total Protein: 8.1 g/dL (ref 6.5–8.1)

## 2016-07-31 LAB — BASIC METABOLIC PANEL
ANION GAP: 8 (ref 5–15)
BUN: 20 mg/dL (ref 6–20)
CHLORIDE: 105 mmol/L (ref 101–111)
CO2: 25 mmol/L (ref 22–32)
Calcium: 8.8 mg/dL — ABNORMAL LOW (ref 8.9–10.3)
Creatinine, Ser: 1.37 mg/dL — ABNORMAL HIGH (ref 0.61–1.24)
GFR calc Af Amer: >60 mL/min (ref >60)
GFR, EST NON AFRICAN AMERICAN: 54 mL/min — AB (ref >60)
GLUCOSE: 148 mg/dL — AB (ref 65–99)
POTASSIUM: 3.8 mmol/L (ref 3.5–5.1)
SODIUM: 138 mmol/L (ref 135–145)

## 2016-07-31 LAB — URINALYSIS, ROUTINE W REFLEX MICROSCOPIC
BACTERIA UA: NONE SEEN
BILIRUBIN URINE: NEGATIVE
Glucose, UA: >=500 mg/dL — AB
HGB URINE DIPSTICK: NEGATIVE
Ketones, ur: NEGATIVE mg/dL
LEUKOCYTES UA: NEGATIVE
NITRITE: NEGATIVE
PH: 6 (ref 5.0–8.0)
Protein, ur: NEGATIVE mg/dL
SPECIFIC GRAVITY, URINE: 1.016 (ref 1.005–1.030)

## 2016-07-31 LAB — APTT: aPTT: 33 seconds (ref 24–36)

## 2016-07-31 LAB — POC OCCULT BLOOD, ED: Fecal Occult Bld: POSITIVE — AB

## 2016-07-31 LAB — PROTIME-INR
INR: 0.94
Prothrombin Time: 12.6 seconds (ref 11.4–15.2)

## 2016-07-31 LAB — ABO/RH: ABO/RH(D): O POS

## 2016-07-31 LAB — GLUCOSE, CAPILLARY
GLUCOSE-CAPILLARY: 133 mg/dL — AB (ref 65–99)
GLUCOSE-CAPILLARY: 134 mg/dL — AB (ref 65–99)

## 2016-07-31 LAB — TYPE AND SCREEN
ABO/RH(D): O POS
Antibody Screen: NEGATIVE

## 2016-07-31 LAB — CBG MONITORING, ED: GLUCOSE-CAPILLARY: 212 mg/dL — AB (ref 65–99)

## 2016-07-31 LAB — SURGICAL PCR SCREEN
MRSA, PCR: NEGATIVE
Staphylococcus aureus: NEGATIVE

## 2016-07-31 SURGERY — CYSTOURETEROSCOPY, WITH RETROGRADE PYELOGRAM AND STENT INSERTION
Anesthesia: General | Site: Ureter | Laterality: Right

## 2016-07-31 MED ORDER — SUGAMMADEX SODIUM 200 MG/2ML IV SOLN
INTRAVENOUS | Status: AC
  Filled 2016-07-31: qty 2

## 2016-07-31 MED ORDER — LACTATED RINGERS IV SOLN
INTRAVENOUS | Status: DC | PRN
  Administered 2016-07-31 (×2): via INTRAVENOUS

## 2016-07-31 MED ORDER — HYDROMORPHONE HCL 1 MG/ML IJ SOLN
0.5000 mg | Freq: Once | INTRAMUSCULAR | Status: AC
  Administered 2016-07-31: 0.5 mg via INTRAVENOUS
  Filled 2016-07-31: qty 1

## 2016-07-31 MED ORDER — KETOROLAC TROMETHAMINE 30 MG/ML IJ SOLN
INTRAMUSCULAR | Status: AC
  Filled 2016-07-31: qty 1

## 2016-07-31 MED ORDER — ONDANSETRON HCL 4 MG/2ML IJ SOLN
INTRAMUSCULAR | Status: AC
  Filled 2016-07-31: qty 2

## 2016-07-31 MED ORDER — INSULIN ASPART 100 UNIT/ML ~~LOC~~ SOLN
0.0000 [IU] | SUBCUTANEOUS | Status: DC
  Administered 2016-07-31 (×2): 1 [IU] via SUBCUTANEOUS
  Administered 2016-07-31: 3 [IU] via SUBCUTANEOUS
  Administered 2016-08-01: 5 [IU] via SUBCUTANEOUS
  Filled 2016-07-31: qty 1

## 2016-07-31 MED ORDER — FENTANYL CITRATE (PF) 100 MCG/2ML IJ SOLN
INTRAMUSCULAR | Status: AC
  Filled 2016-07-31: qty 2

## 2016-07-31 MED ORDER — ACETAMINOPHEN 650 MG RE SUPP: 650.0000 mg | Freq: Four times a day (QID) | RECTAL | Status: DC | PRN

## 2016-07-31 MED ORDER — ONDANSETRON HCL 4 MG/2ML IJ SOLN
4.0000 mg | Freq: Once | INTRAMUSCULAR | Status: AC
  Administered 2016-07-31: 4 mg via INTRAVENOUS
  Filled 2016-07-31: qty 2

## 2016-07-31 MED ORDER — PROPOFOL 10 MG/ML IV BOLUS
INTRAVENOUS | Status: AC
  Filled 2016-07-31: qty 20

## 2016-07-31 MED ORDER — ROCURONIUM BROMIDE 50 MG/5ML IV SOSY
PREFILLED_SYRINGE | INTRAVENOUS | Status: AC
  Filled 2016-07-31: qty 5

## 2016-07-31 MED ORDER — CEFAZOLIN SODIUM-DEXTROSE 2-4 GM/100ML-% IV SOLN
INTRAVENOUS | Status: AC
  Filled 2016-07-31: qty 100

## 2016-07-31 MED ORDER — SODIUM CHLORIDE 0.9 % IV BOLUS (SEPSIS)
500.0000 mL | Freq: Once | INTRAVENOUS | Status: AC
  Administered 2016-07-31: 500 mL via INTRAVENOUS

## 2016-07-31 MED ORDER — PROMETHAZINE HCL 25 MG/ML IJ SOLN: 6.2500 mg | INTRAMUSCULAR | Status: DC | PRN

## 2016-07-31 MED ORDER — FENTANYL CITRATE (PF) 250 MCG/5ML IJ SOLN
INTRAMUSCULAR | Status: AC
  Filled 2016-07-31: qty 5

## 2016-07-31 MED ORDER — SODIUM CHLORIDE 0.9 % IV SOLN
INTRAVENOUS | Status: DC | PRN
  Administered 2016-07-31: 20:00:00

## 2016-07-31 MED ORDER — MEPERIDINE HCL 50 MG/ML IJ SOLN: 6.2500 mg | INTRAMUSCULAR | Status: DC | PRN

## 2016-07-31 MED ORDER — PHENYLEPHRINE HCL 10 MG/ML IJ SOLN
INTRAMUSCULAR | Status: DC | PRN
  Administered 2016-07-31 (×2): 40 ug via INTRAVENOUS

## 2016-07-31 MED ORDER — SODIUM CHLORIDE 0.9 % IV SOLN
INTRAVENOUS | Status: DC
  Administered 2016-07-31 – 2016-08-02 (×5): via INTRAVENOUS

## 2016-07-31 MED ORDER — CEFAZOLIN SODIUM-DEXTROSE 2-3 GM-% IV SOLR
INTRAVENOUS | Status: DC | PRN
  Administered 2016-07-31: 2 g via INTRAVENOUS

## 2016-07-31 MED ORDER — CEFTRIAXONE SODIUM 1 G IJ SOLR
1.0000 g | Freq: Once | INTRAMUSCULAR | Status: AC
  Administered 2016-07-31: 1 g via INTRAVENOUS
  Filled 2016-07-31: qty 10

## 2016-07-31 MED ORDER — ONDANSETRON HCL 4 MG PO TABS: 4.0000 mg | ORAL_TABLET | Freq: Four times a day (QID) | ORAL | Status: DC | PRN

## 2016-07-31 MED ORDER — DEXTROSE 5 % IV SOLN
INTRAVENOUS | Status: AC
  Filled 2016-07-31: qty 10

## 2016-07-31 MED ORDER — 0.9 % SODIUM CHLORIDE (POUR BTL) OPTIME
TOPICAL | Status: DC | PRN
  Administered 2016-07-31: 3000 mL

## 2016-07-31 MED ORDER — ACETAMINOPHEN 325 MG PO TABS: 650.0000 mg | ORAL_TABLET | Freq: Four times a day (QID) | ORAL | Status: DC | PRN

## 2016-07-31 MED ORDER — PROPOFOL 10 MG/ML IV BOLUS
INTRAVENOUS | Status: DC | PRN
  Administered 2016-07-31: 100 mg via INTRAVENOUS
  Administered 2016-07-31: 200 mg via INTRAVENOUS

## 2016-07-31 MED ORDER — MIDAZOLAM HCL 5 MG/5ML IJ SOLN
INTRAMUSCULAR | Status: DC | PRN
  Administered 2016-07-31: 2 mg via INTRAVENOUS

## 2016-07-31 MED ORDER — FENTANYL CITRATE (PF) 100 MCG/2ML IJ SOLN
25.0000 ug | Freq: Once | INTRAMUSCULAR | Status: DC
  Filled 2016-07-31: qty 2

## 2016-07-31 MED ORDER — FENTANYL CITRATE (PF) 100 MCG/2ML IJ SOLN
INTRAMUSCULAR | Status: DC | PRN
  Administered 2016-07-31 (×4): 100 ug via INTRAVENOUS
  Administered 2016-07-31: 50 ug via INTRAVENOUS

## 2016-07-31 MED ORDER — HYDRALAZINE HCL 20 MG/ML IJ SOLN: 10.0000 mg | INTRAMUSCULAR | Status: DC | PRN

## 2016-07-31 MED ORDER — ONDANSETRON HCL 4 MG/2ML IJ SOLN
4.0000 mg | Freq: Four times a day (QID) | INTRAMUSCULAR | Status: DC | PRN
  Administered 2016-07-31 (×2): 4 mg via INTRAVENOUS
  Filled 2016-07-31: qty 2

## 2016-07-31 MED ORDER — MIDAZOLAM HCL 2 MG/2ML IJ SOLN
INTRAMUSCULAR | Status: AC
  Filled 2016-07-31: qty 2

## 2016-07-31 MED ORDER — PHENYLEPHRINE 40 MCG/ML (10ML) SYRINGE FOR IV PUSH (FOR BLOOD PRESSURE SUPPORT)
PREFILLED_SYRINGE | INTRAVENOUS | Status: DC | PRN
  Administered 2016-07-31: 80 ug via INTRAVENOUS

## 2016-07-31 MED ORDER — FENTANYL CITRATE (PF) 100 MCG/2ML IJ SOLN
50.0000 ug | Freq: Once | INTRAMUSCULAR | Status: AC
  Administered 2016-07-31: 50 ug via INTRAVENOUS
  Filled 2016-07-31: qty 2

## 2016-07-31 MED ORDER — BUPIVACAINE LIPOSOME 1.3 % IJ SUSP
20.0000 mL | INTRAMUSCULAR | Status: DC
  Filled 2016-07-31: qty 20

## 2016-07-31 MED ORDER — ROCURONIUM BROMIDE 10 MG/ML (PF) SYRINGE
PREFILLED_SYRINGE | INTRAVENOUS | Status: DC | PRN
  Administered 2016-07-31: 20 mg via INTRAVENOUS
  Administered 2016-07-31: 10 mg via INTRAVENOUS
  Administered 2016-07-31: 30 mg via INTRAVENOUS
  Administered 2016-07-31: 20 mg via INTRAVENOUS

## 2016-07-31 MED ORDER — KETOROLAC TROMETHAMINE 30 MG/ML IJ SOLN
30.0000 mg | Freq: Once | INTRAMUSCULAR | Status: AC
  Administered 2016-07-31: 30 mg via INTRAVENOUS
  Filled 2016-07-31: qty 1

## 2016-07-31 MED ORDER — HYDROMORPHONE HCL 1 MG/ML IJ SOLN
INTRAMUSCULAR | Status: AC
Start: 2016-07-31 — End: 2016-08-01
  Filled 2016-07-31: qty 0.5

## 2016-07-31 MED ORDER — SODIUM CHLORIDE 0.9 % IV BOLUS (SEPSIS)
1000.0000 mL | Freq: Once | INTRAVENOUS | Status: AC
  Administered 2016-07-31: 1000 mL via INTRAVENOUS

## 2016-07-31 MED ORDER — FENTANYL CITRATE (PF) 100 MCG/2ML IJ SOLN
25.0000 ug | INTRAMUSCULAR | Status: DC | PRN
  Administered 2016-07-31 (×4): 25 ug via INTRAVENOUS
  Filled 2016-07-31 (×3): qty 2

## 2016-07-31 MED ORDER — HYDROMORPHONE HCL 1 MG/ML IJ SOLN
0.2500 mg | INTRAMUSCULAR | Status: DC | PRN
  Administered 2016-08-01 (×2): 0.5 mg via INTRAVENOUS

## 2016-07-31 MED ORDER — KETOROLAC TROMETHAMINE 30 MG/ML IJ SOLN: 30.0000 mg | Freq: Once | INTRAMUSCULAR | Status: DC | PRN

## 2016-07-31 MED ORDER — BUPIVACAINE LIPOSOME 1.3 % IJ SUSP
INTRAMUSCULAR | Status: DC | PRN
  Administered 2016-07-31: 20 mL

## 2016-07-31 MED ORDER — SUCCINYLCHOLINE CHLORIDE 200 MG/10ML IV SOSY
PREFILLED_SYRINGE | INTRAVENOUS | Status: DC | PRN
  Administered 2016-07-31: 180 mg via INTRAVENOUS

## 2016-07-31 MED ORDER — LIDOCAINE 2% (20 MG/ML) 5 ML SYRINGE
INTRAMUSCULAR | Status: DC | PRN
  Administered 2016-07-31: 50 mg via INTRAVENOUS

## 2016-07-31 MED ORDER — METOPROLOL TARTRATE 5 MG/5ML IV SOLN
5.0000 mg | Freq: Three times a day (TID) | INTRAVENOUS | Status: DC
  Administered 2016-07-31 – 2016-08-01 (×2): 5 mg via INTRAVENOUS
  Filled 2016-07-31 (×2): qty 5

## 2016-07-31 SURGICAL SUPPLY — 90 items
BAG URINE DRAINAGE (UROLOGICAL SUPPLIES) ×3 IMPLANT
BAG URO CATCHER STRL LF (MISCELLANEOUS) ×3 IMPLANT
BASKET DAKOTA 1.9FR 11X120 (BASKET) IMPLANT
BASKET ZERO TIP NITINOL 2.4FR (BASKET) IMPLANT
BLADE EXTENDED COATED 6.5IN (ELECTRODE) ×3 IMPLANT
BLADE HEX COATED 2.75 (ELECTRODE) ×3 IMPLANT
BSKT STON RTRVL ZERO TP 2.4FR (BASKET)
CATH FOLEY 2WAY SLVR  5CC 18FR (CATHETERS) ×1
CATH FOLEY 2WAY SLVR 5CC 18FR (CATHETERS) ×2 IMPLANT
CATH FOLEY LATEX FREE 16FR (CATHETERS) ×1 IMPLANT
CATH IMAGER II 65CM (CATHETERS) IMPLANT
CATH URET 5FR 28IN OPEN ENDED (CATHETERS) ×3 IMPLANT
CHLORAPREP W/TINT 26ML (MISCELLANEOUS) ×3 IMPLANT
CLIP LIGATING HEM O LOK PURPLE (MISCELLANEOUS) ×8 IMPLANT
CLIP LIGATING HEMO O LOK GREEN (MISCELLANEOUS) ×8 IMPLANT
CLIP TI LARGE 6 (CLIP) IMPLANT
CLOTH BEACON ORANGE TIMEOUT ST (SAFETY) ×3 IMPLANT
COVER SURGICAL LIGHT HANDLE (MISCELLANEOUS) ×3 IMPLANT
DECANTER SPIKE VIAL GLASS SM (MISCELLANEOUS) IMPLANT
DEVICE SECURE STRAP 25 ABSORB (INSTRUMENTS) ×1 IMPLANT
DISSECTOR ROUND CHERRY 3/8 STR (MISCELLANEOUS) ×2 IMPLANT
DRAIN CHANNEL 10F 3/8 F FF (DRAIN) ×3 IMPLANT
DRAIN CHANNEL 19F RND (DRAIN) ×1 IMPLANT
DRAPE LAPAROSCOPIC ABDOMINAL (DRAPES) ×1 IMPLANT
DRAPE LAPAROTOMY TRNSV 102X78 (DRAPE) ×2 IMPLANT
DRAPE WARM FLUID 44X44 (DRAPE) ×3 IMPLANT
ELECT LIGASURE SHORT 9 REUSE (ELECTRODE) ×1 IMPLANT
ELECT REM PT RETURN 15FT ADLT (MISCELLANEOUS) ×3 IMPLANT
EVACUATOR SILICONE 100CC (DRAIN) ×4 IMPLANT
EXTRACTOR STONE NITINOL NGAGE (UROLOGICAL SUPPLIES) IMPLANT
GAUZE SPONGE 4X4 12PLY STRL (GAUZE/BANDAGES/DRESSINGS) ×3 IMPLANT
GAUZE SPONGE 4X4 16PLY XRAY LF (GAUZE/BANDAGES/DRESSINGS) ×3 IMPLANT
GLOVE BIOGEL M STRL SZ7.5 (GLOVE) ×7 IMPLANT
GOWN STRL REUS W/TWL LRG LVL3 (GOWN DISPOSABLE) ×3 IMPLANT
GOWN STRL REUS W/TWL XL LVL3 (GOWN DISPOSABLE) ×6 IMPLANT
GUIDEWIRE ANG ZIPWIRE 038X150 (WIRE) IMPLANT
GUIDEWIRE STR DUAL SENSOR (WIRE) ×7 IMPLANT
IV SOD CHL 0.9% 1000ML (IV SOLUTION) ×3 IMPLANT
KIT BASIN OR (CUSTOM PROCEDURE TRAY) ×3 IMPLANT
LOOP VESSEL MAXI BLUE (MISCELLANEOUS) IMPLANT
MANIFOLD NEPTUNE II (INSTRUMENTS) ×3 IMPLANT
NEEDLE HYPO 22GX1.5 SAFETY (NEEDLE) ×1 IMPLANT
NS IRRIG 1000ML POUR BTL (IV SOLUTION) ×7 IMPLANT
PACK CYSTO (CUSTOM PROCEDURE TRAY) ×3 IMPLANT
PACK GENERAL/GYN (CUSTOM PROCEDURE TRAY) ×3 IMPLANT
PLUG CATH AND CAP STER (CATHETERS) ×3 IMPLANT
SET IRRIG Y TYPE TUR BLADDER L (SET/KITS/TRAYS/PACK) ×3 IMPLANT
SHEATH ACCESS URETERAL 24CM (SHEATH) IMPLANT
SHEATH ACCESS URETERAL 38CM (SHEATH) IMPLANT
SHEATH ACCESS URETERAL 54CM (SHEATH) IMPLANT
SPONGE LAP 18X18 X RAY DECT (DISPOSABLE) ×5 IMPLANT
SPONGE LAP 4X18 X RAY DECT (DISPOSABLE) IMPLANT
STAPLER VISISTAT 35W (STAPLE) ×1 IMPLANT
STENT URET 6FRX24 CONTOUR (STENTS) ×1 IMPLANT
STRIP CLOSURE SKIN 1/2X4 (GAUZE/BANDAGES/DRESSINGS) IMPLANT
SUCTION FRAZIER HANDLE 10FR (MISCELLANEOUS)
SUCTION TUBE FRAZIER 10FR DISP (MISCELLANEOUS) IMPLANT
SUT CHROMIC 0 UR 5 27 (SUTURE) IMPLANT
SUT CHROMIC 2 0 UR 5 27 (SUTURE) IMPLANT
SUT CHROMIC 3 0 SH 27 (SUTURE) ×1 IMPLANT
SUT ETHILON 4 0 PS 2 18 (SUTURE) IMPLANT
SUT PDS AB 1 CTX 36 (SUTURE) ×6 IMPLANT
SUT SILK 0 (SUTURE)
SUT SILK 0 30XBRD TIE 6 (SUTURE) IMPLANT
SUT SILK 2 0 (SUTURE)
SUT SILK 2 0 SH CR/8 (SUTURE) ×1 IMPLANT
SUT SILK 2-0 18XBRD TIE 12 (SUTURE) IMPLANT
SUT SILK 3 0 12 30 (SUTURE) IMPLANT
SUT SILK 3 0 SH CR/8 (SUTURE) ×1 IMPLANT
SUT VIC AB 0 CT1 27 (SUTURE)
SUT VIC AB 0 CT1 27XBRD ANTBC (SUTURE) IMPLANT
SUT VIC AB 2-0 CT2 27 (SUTURE) IMPLANT
SUT VIC AB 2-0 SH 27 (SUTURE) ×12
SUT VIC AB 2-0 SH 27X BRD (SUTURE) IMPLANT
SUT VIC AB 3-0 SH 27 (SUTURE) ×15
SUT VIC AB 3-0 SH 27XBRD (SUTURE) IMPLANT
SUT VIC AB 4-0 RB1 27 (SUTURE) ×12
SUT VIC AB 4-0 RB1 27XBRD (SUTURE) IMPLANT
SUT VIC AB 4-0 SH 18 (SUTURE) IMPLANT
SYR 10ML LL (SYRINGE) ×3 IMPLANT
SYR CONTROL 10ML LL (SYRINGE) ×1 IMPLANT
SYRINGE IRR TOOMEY STRL 70CC (SYRINGE) ×1 IMPLANT
TAPE CLOTH SURG 4X10 WHT LF (GAUZE/BANDAGES/DRESSINGS) ×1 IMPLANT
TOWEL BLUE STERILE X RAY DET (MISCELLANEOUS) ×3 IMPLANT
TOWEL OR 17X26 10 PK STRL BLUE (TOWEL DISPOSABLE) ×3 IMPLANT
TUBE FEEDING 5FR 36IN KANGAROO (TUBING) IMPLANT
TUBING CONNECTING 10 (TUBING) ×3 IMPLANT
WATER STERILE IRR 1500ML POUR (IV SOLUTION) ×2 IMPLANT
WIRE COONS/BENSON .038X145CM (WIRE) IMPLANT
YANKAUER SUCT BULB TIP 10FT TU (MISCELLANEOUS) ×1 IMPLANT

## 2016-07-31 NOTE — ED Notes (Signed)
MADE FIRST REQUEST FOR A URINE SAMPLE, PT UNABLE TO PROVIDE ONE AT THIS TIME

## 2016-07-31 NOTE — Consult Note (Signed)
I have been asked to see the patient by Dr. Gean Birchwood, for evaluation and management of obstructing right sided ureteral stones.  History of present illness: 61 year old male presented to the emergency department with right-sided abdominal pain and blood per rectum.He has associated nausea and vomiting and pain rating to the right flank. In the ER there was no noticeable bleeding. The patient does have a history of both internal and external hemorrhoids. His hemoglobin was stable. His vital signs are stable. He did undergo a CT scan which demonstrated 3 right sided midureteral stones.The patient had a mildly elevated white blood cell count and his creatinine was normal. A urinalysis has not been sent at this time.  The patient is being admitted for observation by the hospitalist service for GI bleed, we will consult and for further management and evaluation of his mid ureteral stones.  Review of systems: A 12 point comprehensive review of systems was obtained and is negative unless otherwise stated in the history of present illness.  Patient Active Problem List   Diagnosis Date Noted  . Rectal bleeding 07/31/2016  . Uncontrolled type 2 diabetes mellitus with hyperglycemia ([place]) 07/31/2016  . Urolithiasis 07/31/2016  . Right ureteral stone   . Type 2 diabetes mellitus without complication, without long-term current use of insulin ([place]) 05/29/2016  . Dyslipidemia associated with type 2 diabetes mellitus ([place]) 05/29/2016  . Closed nondisplaced spiral fracture of shaft of right tibia   . Right tibial fracture 03/22/2016  . Microcytic anemia 02/17/2012  . Prostate cancer ([place]) 02/17/2012  . 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 Benign hypertension 02/17/2012  . Depressive disorder 02/17/2012    No current facility-administered medications on file prior to encounter.    Current Outpatient Prescriptions on File Prior to Encounter  Medication Sig Dispense Refill  . amLODipine (NORVASC)  5 MG tablet Take 5 mg by mouth daily.    Marland Kitchen aspirin EC 81 MG tablet Take 81 mg by mouth daily.    Marland Kitchen atorvastatin (LIPITOR) 20 MG tablet Take 20 mg by mouth daily.    Marland Kitchen HYDROcodone-acetaminophen (NORCO/VICODIN) 5-325 MG tablet Take one to two tablets by mouth every 6 hours as needed for pain 240 tablet 0  . LORazepam (ATIVAN) 0.5 MG tablet Take 0.5 mg by mouth at bedtime as needed for anxiety.     Marland Kitchen losartan-hydrochlorothiazide (HYZAAR) 100-25 MG tablet Take 1 tablet by mouth daily.    . metFORMIN (GLUCOPHAGE) 1000 MG tablet Take 1,000 mg by mouth 2 (two) times daily with a meal.    . metoprolol (LOPRESSOR) 50 MG tablet Take 50 mg by mouth daily.    . Multiple Vitamin (MULTIVITAMIN) tablet Take 1 tablet by mouth daily.    . sertraline (ZOLOFT) 50 MG tablet Take 1 tablet by mouth in the evening for anxiety      Past Medical History:  Diagnosis Date  . Depression   . GERD (gastroesophageal reflux disease)   . Hemorrhoids   . Hypertension   . Macular degeneration   . Microcytic anemia   . Prostate cancer ([place]) 08/2009  . Tubular adenoma     Past Surgical History:  Procedure Laterality Date  . INGUINAL HERNIA REPAIR    . Left ankle joint fusion  1981  . PROSTATE BIOPSY     x 2    Social History  Substance Use Topics  . Smoking status: Never Smoker  . Smokeless tobacco: Never Used  . Alcohol use Yes     Comment: Occasional  Family History  Problem Relation Age of Onset  . Lung cancer Mother        Deceased  . Throat cancer Brother   . Pancreatic cancer Father   . Heart disease Father        Deceased  . Lung cancer Maternal Uncle        nephew  . Diabetes Other     PE: Vitals:   07/31/16 0800 07/31/16 0910 07/31/16 0930 07/31/16 0939  BP: (!) 137/96 (!) 150/98 132/78 136/85  Pulse: 95 85 89 92  Resp: 19 14  16   Temp:  97.6 F (36.4 C)    TempSrc:  Oral  Oral  SpO2: 91% 94% 93% 95%  Weight:      Height:       Patient appears to be in no acute distress   patient is alert and oriented x3 Atraumatic normocephalic head No cervical or supraclavicular lymphadenopathy appreciated No increased work of breathing, no audible wheezes/rhonchi Regular sinus rhythm/rate Abdomen is soft, nontender, nondistended, Right-sided lower abdominal pain and flank pain Lower extremities are symmetric without appreciable edema Grossly neurologically intact No identifiable skin lesions   Recent Labs  07/31/16 0037  WBC 12.9*  HGB 15.8  HCT 44.5    Recent Labs  07/31/16 0037  NA 136  K 3.8  CL 100*  CO2 23  GLUCOSE 253*  BUN 22*  CREATININE 1.22  CALCIUM 9.8    Recent Labs  07/31/16 0129  INR 0.94   No results for input(s): LABURIN in the last 72 hours. Results for orders placed or performed during the hospital encounter of 03/22/16  Surgical PCR screen     Status: None   Collection Time: 03/25/16 12:16 PM  Result Value Ref Range Status   MRSA, PCR NEGATIVE NEGATIVE Final   Staphylococcus aureus NEGATIVE NEGATIVE Final    Comment:        The Xpert SA Assay (FDA approved for NASAL specimens in patients over 35 years of age), is one component of a comprehensive surveillance program.  Test performance has been validated by [facility] for patients greater than or equal to 42 year old. It is not intended to diagnose infection nor to guide or monitor treatment.     Imaging: I have independently reviewed the patient's CT scan demonstrating 3 small ureteral stones causing severe proximal hydroureteronephrosis.  Imp: The patient has symptomatic obstructing right ureteral stones. He also initially presented with GI bleed. Our plan at this point is to monitor his hemoglobin in the morning, and treat his stones this afternoon if his hemoglobin remains stable.  Recommendations: I discussed the treatment options with the patient including conservative management, or proceeding to the operating room for definitive stone management. The  patient's pain is severe and he is anxious to get relief. As such, we'll plan to proceed to the operating room for cystoscopy, right retrograde pyelogram, right ureteroscopy, laser lithotripsy and stent placement. I discussed the surgery with the patient detail including the risks and the benefits, he agreed to proceed.  We will follow-up the patient's hemoglobin and as long as it stable, we will plan to do the patient around 2:00 PM. The patient will need a urinalysis performed today prior.   Louis Meckel W

## 2016-07-31 NOTE — Progress Notes (Signed)
   Follow Up Note  HPI: 61 year old male with past mental history of hypertension and diabetes mellitus presented to the emergency room with complains of rectal bleeding. His had this before felt to be second to hemorrhoids and possibly diverticulosis. Hemoglobin on admission stable however CT scan of abdomen and pelvis noted right-sided hydronephrosis with 3 ureteric stones. Seen by urology who plan to take patient this afternoon for stent placement. UA unrevealing Pt admitted earlier this morning.  Seen after arrived to floor.    Exam: CV: Regular rate and rhythm, S1-S2 Lungs: Clear auscultation bilaterally Abd: Soft, nontender, nontender, positive bowel sounds Ext: No clubbing or cyanosis or edema  Present on Admission: . Rectal bleeding: Follow-up hemoglobin at 13. Initial hemoglobin likely from hemoconcentration. Suspect hemorrhoidal or even diverticular. Stable. No reason for GI consult at this time. Repeat hemoglobin the morning. No further bleeding. . Benign hypertension . Uncontrolled type 2 diabetes mellitus with hyperglycemia ([place]): Nothing by mouth plus sliding-scale . Urolithiasis with right-sided hydronephrosis: Seen by urology. Plan for stent placement. Urinalysis unrevealing   Disposition: If patient continues to be stable, anticipate discharge tomorrow

## 2016-07-31 NOTE — ED Notes (Signed)
Patient transported to CT 

## 2016-07-31 NOTE — ED Notes (Signed)
ADMISSION RN KIM PRESENT  

## 2016-07-31 NOTE — ED Notes (Signed)
DR Louis Meckel UROLOGY PRESENT TO EVALUATE THIS PT. DR. FEELS HIS CONCERN APPEARS TO BE COMING FROM HIS KIDNEY STONE AND DR. HERRICK PLANS TO PERFORM A PROCEDURE THIS AFTERNOON. PT IS NOW NPO

## 2016-07-31 NOTE — ED Notes (Signed)
Patient has internal and external hemorrhoids assessed on occult sampling.

## 2016-07-31 NOTE — ED Provider Notes (Signed)
[facility] DEPT Provider Note   CSN: [id number] Arrival date & time: 07/31/16  4098  By signing my name below, I, Collene Leyden, attest that this documentation has been prepared under the direction and in the presence of Rolland Porter, MD. Electronically Signed: Collene Leyden, [place]. 07/31/16. 1:09 AM.  Time seen 01:00 AM  History   Chief Complaint Chief Complaint  Patient presents with  . Abdominal Pain  . Rectal Bleeding    HPI Comments: [name] is a 61 y.o. male with a history of GERD, HTN, DM, and prostate cancer, who presents to the Emergency Department by ambulance, complaining of sudden-onset, constant RLQ abdominal pain that began yesterday evening, May 15 at 6 PM. Patient was brought in from [facility] after complaining to staff of abdominal pain and rectal bleeding. Patient states he noticed that he was bleeding from his rectum x2 episodes, began having progressively worsening abdominal pain prior to this episode. Patient reports progressively worsening abdominal pain. Patient has not had similar symptoms in the past. Patient reports associated mild back pain, nausea, and vomiting x1 episode. No modifying factors indicated. Patient currently ambulates with a walker. Patient denies any abdominal surgery history or blood thinner use. Patient denies any dysuria, hematuria, fever, chills, numbness, or weakness. He has never had this pain before. States he can't get comfortable. Nothing makes the pain worse, nothing makes it feel better.  EMS vital signs: BP 158/72, HR 100, 94%RA, cbg 258.   The history is provided by the patient. No language interpreter was used.    PCP [name]   Past Medical History:  Diagnosis Date  . Depression   . GERD (gastroesophageal reflux disease)   . Hemorrhoids   . Hypertension   . Macular degeneration   . Microcytic anemia   . Prostate cancer ([place]) 08/2009  . Tubular adenoma     Patient Active Problem List   Diagnosis Date Noted  . Type 2 diabetes mellitus without complication, without long-term current use of insulin ([place]) 05/29/2016  . Dyslipidemia associated with type 2 diabetes mellitus ([place]) 05/29/2016  . Closed nondisplaced spiral fracture of shaft of right tibia   . Right tibial fracture 03/22/2016  . Microcytic anemia 02/17/2012  . Prostate cancer ([place]) 02/17/2012  . 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 Benign hypertension 02/17/2012  . Depressive disorder 02/17/2012    Past Surgical History:  Procedure Laterality Date  . INGUINAL HERNIA REPAIR    . Left ankle joint fusion  1981  . PROSTATE BIOPSY     x 2       Home Medications    Prior to Admission medications   Medication Sig Start Date End Date Taking? Authorizing Provider  amLODipine (NORVASC) 5 MG tablet Take 5 mg by mouth daily.   Yes [provider]  aspirin EC 81 MG tablet Take 81 mg by mouth daily.   Yes [provider]  atorvastatin (LIPITOR) 20 MG tablet Take 20 mg by mouth daily.   Yes [provider]  HYDROcodone-acetaminophen (NORCO/VICODIN) 5-325 MG tablet Take one to two tablets by mouth every 6 hours as needed for pain 07/01/16  Yes Reed, Tiffany L, DO  LORazepam (ATIVAN) 0.5 MG tablet Take 0.5 mg by mouth at bedtime as needed for anxiety.    Yes [provider]  losartan-hydrochlorothiazide (HYZAAR) 100-25 MG tablet Take 1 tablet by mouth daily.   Yes [provider]  metFORMIN (GLUCOPHAGE) 1000 MG tablet Take 1,000 mg by mouth 2 (  two) times daily with a meal.   Yes [provider]  metoprolol (LOPRESSOR) 50 MG tablet Take 50 mg by mouth daily.   Yes [provider]  Multiple Vitamin (MULTIVITAMIN) tablet Take 1 tablet by mouth daily.   Yes [provider]  sertraline (ZOLOFT) 50 MG tablet Take 1 tablet by mouth in the evening for anxiety 06/27/16  Yes [provider]    Family History Family History  Problem Relation Age of  Onset  . Lung cancer Mother        Deceased  . Throat cancer Brother   . Pancreatic cancer Father   . Heart disease Father        Deceased  . Lung cancer Maternal Uncle        nephew  . Diabetes Other     Social History Social History  Substance Use Topics  . Smoking status: Never Smoker  . Smokeless tobacco: Never Used  . Alcohol use Yes     Comment: Occasional  Patient has been in a rehabilitation facility 5 weeks after a fall in January when he broke his leg in 3 places. He states he's currently in a walker. He usually lives home alone.   Allergies   Patient has no known allergies.   Review of Systems Review of Systems  Constitutional: Negative for chills and fever.  Gastrointestinal: Positive for abdominal pain, anal bleeding, nausea and vomiting.  Genitourinary: Negative for dysuria and hematuria.  Neurological: Negative for weakness and numbness.  All other systems reviewed and are negative.    Physical Exam Updated Vital Signs BP (!) 141/91 (BP Location: Right Arm)   Pulse (!) 103   Temp 98 F (36.7 C) (Oral)   Resp 20   Ht 5\' 6"  (1.676 m)   Wt 190 lb (86.2 kg)   SpO2 99%   BMI 30.67 kg/m   Vital signs normal except for tachycardia   Physical Exam  Constitutional: He is oriented to person, place, and time. He appears well-developed and well-nourished.  Non-toxic appearance. He does not appear ill. No distress.  Uncomfortable appearing.  HENT:  Head: Normocephalic and atraumatic.  Right Ear: External ear normal.  Left Ear: External ear normal.  Nose: Nose normal. No mucosal edema or rhinorrhea.  Mouth/Throat: Oropharynx is clear and moist and mucous membranes are normal. No dental abscesses or uvula swelling.  Eyes: Conjunctivae and EOM are normal. Pupils are equal, round, and reactive to light.  Neck: Normal range of motion and full passive range of motion without pain. Neck supple.  Cardiovascular: Normal rate, regular rhythm and normal heart  sounds.  Exam reveals no gallop and no friction rub.   No murmur heard. Pulmonary/Chest: Effort normal and breath sounds normal. No respiratory distress. He has no wheezes. He has no rhonchi. He has no rales. He exhibits no tenderness and no crepitus.  Abdominal: Soft. Normal appearance and bowel sounds are normal. He exhibits no distension. There is tenderness. There is no rebound and no guarding.  RLQ tenderness.   Musculoskeletal: Normal range of motion. He exhibits no edema or tenderness.  Moves all extremities well.   Neurological: He is alert and oriented to person, place, and time. He has normal strength. No cranial nerve deficit.  Skin: Skin is warm, dry and intact. No rash noted. No erythema. No pallor.  Psychiatric: He has a normal mood and affect. His speech is normal and behavior is normal. His mood appears not anxious.  Nursing note and  vitals reviewed.    ED Treatments / Results    Results for orders placed or performed during the hospital encounter of 07/31/16  Comprehensive metabolic panel  Result Value Ref Range   Sodium 136 135 - 145 mmol/L   Potassium 3.8 3.5 - 5.1 mmol/L   Chloride 100 (L) 101 - 111 mmol/L   CO2 23 22 - 32 mmol/L   Glucose, Bld 253 (H) 65 - 99 mg/dL   BUN 22 (H) 6 - 20 mg/dL   Creatinine, Ser 1.22 0.61 - 1.24 mg/dL   Calcium 9.8 8.9 - 10.3 mg/dL   Total Protein 8.1 6.5 - 8.1 g/dL   Albumin 4.2 3.5 - 5.0 g/dL   AST 34 15 - 41 U/L   ALT 43 17 - 63 U/L   Alkaline Phosphatase 95 38 - 126 U/L   Total Bilirubin 0.6 0.3 - 1.2 mg/dL   GFR calc non Af Amer >60 >60 mL/min   GFR calc Af Amer >60 >60 mL/min   Anion gap 13 5 - 15  CBC  Result Value Ref Range   WBC 12.9 (H) 4.0 - 10.5 K/uL   RBC 5.22 4.22 - 5.81 MIL/uL   Hemoglobin 15.8 13.0 - 17.0 g/dL   HCT 44.5 39.0 - 52.0 %   MCV 85.2 78.0 - 100.0 fL   MCH 30.3 26.0 - 34.0 pg   MCHC 35.5 30.0 - 36.0 g/dL   RDW 13.7 11.5 - 15.5 %   Platelets 288 150 - 400 K/uL  Protime-INR  Result Value Ref  Range   Prothrombin Time 12.6 11.4 - 15.2 seconds   INR 0.94   APTT  Result Value Ref Range   aPTT 33 24 - 36 seconds  POC occult blood, ED  Result Value Ref Range   Fecal Occult Bld POSITIVE (A) NEGATIVE  Type and screen [place]  Result Value Ref Range   ABO/RH(D) O POS    Antibody Screen NEG    Sample Expiration 08/03/2016   ABO/Rh  Result Value Ref Range   ABO/RH(D) O POS    Laboratory interpretation all normal except + Hemoccult without anemia, elevated BUN consistent with GI bleed    EKG  EKG Interpretation None       Radiology Ct Renal Stone Study  Result Date: 07/31/2016 CLINICAL DATA:  Right lower quadrant pain radiating into right flank. Rectal bleeding tonight. EXAM: CT ABDOMEN AND PELVIS WITHOUT CONTRAST TECHNIQUE: Multidetector CT imaging of the abdomen and pelvis was performed following the standard protocol without IV contrast. COMPARISON:  None. FINDINGS: Lower chest: Bronchial thickening with areas of bronchial filling in the right bronchus intermedius with atelectasis and tree in bud opacities in the right lower and right middle lobes. Heart is normal in size. Coronary artery calcifications. Hepatobiliary: Decreased hepatic density consistent with steatosis. Focal fatty sparing adjacent gallbladder fossa. No evidence of focal lesion. Gallbladder physiologically distended, no calcified stone. No biliary dilatation. Pancreas: Parenchymal atrophy. No ductal dilatation or inflammation. Spleen: Normal in size without focal abnormality. Adrenals/Urinary Tract: Moderate right hydronephrosis. There are 2 obstructing stones in the mid ureter measuring 4 and 3 mm respectively at the level of L3-L4. Aim punctate stone is in the ureter slightly more distally. Additional punctate nonobstructing stone in the lower right kidney. Mild right hydronephrosis. Cyst in the lower pole the right kidney measured approximately 4.1 cm. Probable additional cortical cyst  in the upper kidney. No left hydronephrosis. No left urolithiasis. Exophytic cyst from the lower  left kidney measures 5 cm. The adrenal glands are normal. Urinary bladder is physiologically distended, no definite wall thickening. Stomach/Bowel: Stomach is decompressed. No small bowel inflammation, wall thickening or obstruction. The appendix is normal. Small to moderate stool burden throughout the colon. Vascular/Lymphatic: Mild aortic atherosclerosis without aneurysm. Few prominent porta hepatis nodes are likely reactive. Reproductive: Prostate gland is enlarged measuring 5.7 x 5.0 cm causing mass effect on the bladder base. Other: Calcified over density in the central pelvis may be calcified lymph node or sequela of prior fat necrosis. There is fat within both inguinal canals. Tiny fat containing umbilical hernia. Musculoskeletal: There are no acute or suspicious osseous abnormalities. IMPRESSION: 1. Right hydronephrosis. There are 3 stones in the right mid ureter, 2 adjacent measuring 4 and 3 mm, with a slightly more distal punctate stone. 2. Additional nonobstructing right renal calculi. 3. Hepatic steatosis. 4. Lung base evaluation demonstrates bronchial thickening of the right bronchus intermedius, lower and middle lobes. Associated atelectasis and tree in bud opacities. Findings suggest bronchial filling process, aspiration or mucoid plugging with adjacent postobstructive atelectasis and bronchiolitis. 5. Additional incidental findings include bilateral renal cysts and enlarged prostate gland. Aortic atherosclerosis and coronary artery calcifications. Electronically Signed   By: Jeb Levering M.D.   On: 07/31/2016 03:17    Procedures Procedures (including critical care time)  Medications Ordered in ED Medications  0.9 %  sodium chloride infusion ( Intravenous New Bag/Given 07/31/16 0311)  fentaNYL (SUBLIMAZE) injection 50 mcg (50 mcg Intravenous Given 07/31/16 0146)  sodium chloride 0.9 % bolus  1,000 mL (0 mLs Intravenous Stopped 07/31/16 0245)  sodium chloride 0.9 % bolus 500 mL (0 mLs Intravenous Stopped 07/31/16 0215)  ondansetron (ZOFRAN) injection 4 mg (4 mg Intravenous Given 07/31/16 0142)  ketorolac (TORADOL) 30 MG/ML injection 30 mg (30 mg Intravenous Given 07/31/16 0422)  fentaNYL (SUBLIMAZE) injection 50 mcg (50 mcg Intravenous Given 07/31/16 0425)     Initial Impression / Assessment and Plan / ED Course  I have reviewed the triage vital signs and the nursing notes.  Pertinent labs & imaging results that were available during my care of the patient were reviewed by me and considered in my medical decision making (see chart for details).      DIAGNOSTIC STUDIES: Oxygen Saturation is 99% on RA, normal by my interpretation.    COORDINATION OF CARE: 1:08 AM Discussed treatment plan with pt at bedside and pt agreed to plan, which includes pain medication and a Renal CT scan of the abdomen and pelvis. Patient is acting like he has a stone, he appears to have difficulty lying still. The pain starts in his right lower quadrant and radiates into his back. He's had vomiting twice. However this would not explain the rectal bleeding. He is not on a blood thinner and he's never been told he had diverticulosis.  Nurses report patient had one episode of rectal bleeding while in the ED.   After reviewing his CT scan he was given an additional dose of fentanyl and ketorolac 30 mg IV.  5 AM I talked to the patient about his test results. Right now his pain seems to be controlled. We discussed being admitted for observation to make sure the rectal bleeding resolves. Patient is agreeable. Patient's pain is currently controlled.  05:09 AM Dr Hal Hope, hospitalist, will see patient.    Final Clinical Impressions(s) / ED Diagnoses   Final diagnoses:  Rectal bleeding  Right ureteral stone    Plan admission  Rolland Porter,  MD, FACEP   I personally performed the services described in  this documentation, which was scribed in my presence. The recorded information has been reviewed and considered.  Rolland Porter, MD, Barbette Or, MD 07/31/16 [phone]

## 2016-07-31 NOTE — ED Triage Notes (Signed)
Pt brought in via EMS from [facility] facility. Per ems patient notified staff there that he was having some abdominal pain with some rectal bleeding. Per staff he had RLQ with palpation. Per patient he takes metformin bid and last dose was at 7pm. Denies pain except for palpation. Is verfied with staff not on blood thinners. Denies any recent abdominal injuries. VS: 158/72, 100HR, 20, 94%RA, cbg 258.

## 2016-07-31 NOTE — Anesthesia Preprocedure Evaluation (Addendum)
Anesthesia Evaluation  Patient identified by MRN, date of birth, ID band Patient awake    Reviewed: Allergy & Precautions, NPO status , Patient's Chart, lab work & pertinent test results, reviewed documented beta blocker date and time   Airway Mallampati: III       Dental no notable dental hx.    Pulmonary    Pulmonary exam normal        Cardiovascular hypertension, Pt. on medications and Pt. on home beta blockers Normal cardiovascular exam Rhythm:Regular Rate:Normal     Neuro/Psych PSYCHIATRIC DISORDERS negative neurological ROS     GI/Hepatic   Endo/Other  diabetes, Type 2  Renal/GU      Musculoskeletal   Abdominal Normal abdominal exam  (+)   Peds  Hematology   Anesthesia Other Findings   Reproductive/Obstetrics                            Anesthesia Physical Anesthesia Plan  ASA: II  Anesthesia Plan: General   Post-op Pain Management:    Induction: Intravenous  Airway Management Planned: Oral ETT  Additional Equipment:   Intra-op Plan:   Post-operative Plan: Extubation in OR  Informed Consent: I have reviewed the patients History and Physical, chart, labs and discussed the procedure including the risks, benefits and alternatives for the proposed anesthesia with the patient or authorized representative who has indicated his/her understanding and acceptance.     Plan Discussed with: CRNA and Surgeon  Anesthesia Plan Comments:         Anesthesia Quick Evaluation

## 2016-07-31 NOTE — Progress Notes (Signed)
Formal note to follow.  Patient with severe right flank/adominal pain and some rectal bleeding.  CT scan demonstrated three obstructing stones in the right ureter.  He has a history of internal and external hemorrhoids.  Hbg in ED was normal.  VS otherwise normal.  Plan to trend hemoglobin this AM and if stable with no bleeding proceed this PM to the OR for removal of stones.  Patient should remain NPO.

## 2016-07-31 NOTE — H&P (Signed)
History and Physical    [name] CHE:[id number] DOB: 1956-03-05 DOA: 07/31/2016  PCP: [name]  Patient coming from: Home.  Chief Complaint: Rectal bleeding.  HPI: [name] is a 61 y.o. male with history of hypertension, hyperlipidemia, diabetes mellitus type 2 presents to the ER with complaints of rectal bleeding. Patient states since last evening patient had 3 episodes of frank rectal bleeding. Patient also started developing right flank pain radiating to the groin at the same time. Has been having some nausea. Denies any chest pain or shortness of breath.   ED Course: In the ER patient had one small episode of rectal bleeding. CT renal stone study shows right-sided hydronephrosis with 3 ureteric stones. Stool for occult blood was positive. Patient is mildly tachycardic. Patient is being admitted for further management of rectal bleeding and ureteric stones.  Review of Systems: As per HPI, rest all negative.   Past Medical History:  Diagnosis Date  . Depression   . GERD (gastroesophageal reflux disease)   . Hemorrhoids   . Hypertension   . Macular degeneration   . Microcytic anemia   . Prostate cancer ([place]) 08/2009  . Tubular adenoma     Past Surgical History:  Procedure Laterality Date  . INGUINAL HERNIA REPAIR    . Left ankle joint fusion  1981  . PROSTATE BIOPSY     x 2     reports that he has never smoked. He has never used smokeless tobacco. He reports that he drinks alcohol. He reports that he does not use drugs.  No Known Allergies  Family History  Problem Relation Age of Onset  . Lung cancer Mother        Deceased  . Throat cancer Brother   . Pancreatic cancer Father   . Heart disease Father        Deceased  . Lung cancer Maternal Uncle        nephew  . Diabetes Other     Prior to Admission medications   Medication Sig Start Date End Date Taking? Authorizing Provider  amLODipine (NORVASC) 5 MG tablet Take 5 mg by mouth daily.   Yes  [provider]  aspirin EC 81 MG tablet Take 81 mg by mouth daily.   Yes [provider]  atorvastatin (LIPITOR) 20 MG tablet Take 20 mg by mouth daily.   Yes [provider]  HYDROcodone-acetaminophen (NORCO/VICODIN) 5-325 MG tablet Take one to two tablets by mouth every 6 hours as needed for pain 07/01/16  Yes Reed, Tiffany L, DO  LORazepam (ATIVAN) 0.5 MG tablet Take 0.5 mg by mouth at bedtime as needed for anxiety.    Yes [provider]  losartan-hydrochlorothiazide (HYZAAR) 100-25 MG tablet Take 1 tablet by mouth daily.   Yes [provider]  metFORMIN (GLUCOPHAGE) 1000 MG tablet Take 1,000 mg by mouth 2 (two) times daily with a meal.   Yes [provider]  metoprolol (LOPRESSOR) 50 MG tablet Take 50 mg by mouth daily.   Yes [provider]  Multiple Vitamin (MULTIVITAMIN) tablet Take 1 tablet by mouth daily.   Yes [provider]  sertraline (ZOLOFT) 50 MG tablet Take 1 tablet by mouth in the evening for anxiety 06/27/16  Yes [provider]    Physical Exam: Vitals:   07/31/16 0100 07/31/16 0225 07/31/16 0300 07/31/16 0500  BP: 133/83 122/84 138/86 (!) 154/89  Pulse: (!) 102 (!) 101 (!) 101 (!) 102  Resp: 17 18 (!)  21 13  Temp:      TempSrc:      SpO2: 94% 96% 92% 94%  Weight:      Height:          Constitutional: Moderately built and nourished. Vitals:   07/31/16 0100 07/31/16 0225 07/31/16 0300 07/31/16 0500  BP: 133/83 122/84 138/86 (!) 154/89  Pulse: (!) 102 (!) 101 (!) 101 (!) 102  Resp: 17 18 (!) 21 13  Temp:      TempSrc:      SpO2: 94% 96% 92% 94%  Weight:      Height:       Eyes: Anicteric no pallor. ENMT: No discharge from the ears eyes nose and mouth. Neck: No JVD appreciated no mass felt. Respiratory: No rhonchi or crepitations. Cardiovascular: S1 and S2 heard no murmurs appreciated. Abdomen: Soft nontender bowel sounds present. Musculoskeletal: No edema. No joint  effusion. Skin: No rash. Skin is warm. Neurologic: Alert awake oriented to time place and person. Moves all extremities. Psychiatric: Appears normal. Normal affect.   Labs on Admission: I have personally reviewed following labs and imaging studies  CBC:  Recent Labs Lab 07/31/16 0037  WBC 12.9*  HGB 15.8  HCT 44.5  MCV 85.2  PLT 287   Basic Metabolic Panel:  Recent Labs Lab 07/31/16 0037  NA 136  K 3.8  CL 100*  CO2 23  GLUCOSE 253*  BUN 22*  CREATININE 1.22  CALCIUM 9.8   GFR: Estimated Creatinine Clearance: 64.6 mL/min (by C-G formula based on SCr of 1.22 mg/dL). Liver Function Tests:  Recent Labs Lab 07/31/16 0037  AST 34  ALT 43  ALKPHOS 95  BILITOT 0.6  PROT 8.1  ALBUMIN 4.2   No results for input(s): LIPASE, AMYLASE in the last 168 hours. No results for input(s): AMMONIA in the last 168 hours. Coagulation Profile:  Recent Labs Lab 07/31/16 0129  INR 0.94   Cardiac Enzymes: No results for input(s): CKTOTAL, CKMB, CKMBINDEX, TROPONINI in the last 168 hours. BNP (last 3 results) No results for input(s): PROBNP in the last 8760 hours. HbA1C: No results for input(s): HGBA1C in the last 72 hours. CBG: No results for input(s): GLUCAP in the last 168 hours. Lipid Profile: No results for input(s): CHOL, HDL, LDLCALC, TRIG, CHOLHDL, LDLDIRECT in the last 72 hours. Thyroid Function Tests: No results for input(s): TSH, T4TOTAL, FREET4, T3FREE, THYROIDAB in the last 72 hours. Anemia Panel: No results for input(s): VITAMINB12, FOLATE, FERRITIN, TIBC, IRON, RETICCTPCT in the last 72 hours. Urine analysis:    Component Value Date/Time   COLORURINE YELLOW 04/18/2008 [place] 04/18/2008 1153   LABSPEC 1.027 04/18/2008 1153   PHURINE 5.5 04/18/2008 1153   GLUCOSEU NEGATIVE 04/18/2008 1153   HGBUR NEGATIVE 04/18/2008 1153   BILIRUBINUR NEGATIVE 04/18/2008 1153   KETONESUR NEGATIVE 04/18/2008 1153   PROTEINUR NEGATIVE 04/18/2008 1153    UROBILINOGEN 0.2 04/18/2008 1153   NITRITE NEGATIVE 04/18/2008 1153   LEUKOCYTESUR  04/18/2008 1153    NEGATIVE MICROSCOPIC NOT DONE ON URINES WITH NEGATIVE PROTEIN, BLOOD, LEUKOCYTES, NITRITE, OR GLUCOSE <1000 mg/dL.   Sepsis Labs: @LABRCNTIP (procalcitonin:4,lacticidven:4) )No results found for this or any previous visit (from the past 240 hour(s)).   Radiological Exams on Admission: Ct Renal Stone Study  Result Date: 07/31/2016 CLINICAL DATA:  Right lower quadrant pain radiating into right flank. Rectal bleeding tonight. EXAM: CT ABDOMEN AND PELVIS WITHOUT CONTRAST TECHNIQUE: Multidetector CT imaging of the abdomen and pelvis was performed following the  standard protocol without IV contrast. COMPARISON:  None. FINDINGS: Lower chest: Bronchial thickening with areas of bronchial filling in the right bronchus intermedius with atelectasis and tree in bud opacities in the right lower and right middle lobes. Heart is normal in size. Coronary artery calcifications. Hepatobiliary: Decreased hepatic density consistent with steatosis. Focal fatty sparing adjacent gallbladder fossa. No evidence of focal lesion. Gallbladder physiologically distended, no calcified stone. No biliary dilatation. Pancreas: Parenchymal atrophy. No ductal dilatation or inflammation. Spleen: Normal in size without focal abnormality. Adrenals/Urinary Tract: Moderate right hydronephrosis. There are 2 obstructing stones in the mid ureter measuring 4 and 3 mm respectively at the level of L3-L4. Aim punctate stone is in the ureter slightly more distally. Additional punctate nonobstructing stone in the lower right kidney. Mild right hydronephrosis. Cyst in the lower pole the right kidney measured approximately 4.1 cm. Probable additional cortical cyst in the upper kidney. No left hydronephrosis. No left urolithiasis. Exophytic cyst from the lower left kidney measures 5 cm. The adrenal glands are normal. Urinary bladder is physiologically  distended, no definite wall thickening. Stomach/Bowel: Stomach is decompressed. No small bowel inflammation, wall thickening or obstruction. The appendix is normal. Small to moderate stool burden throughout the colon. Vascular/Lymphatic: Mild aortic atherosclerosis without aneurysm. Few prominent porta hepatis nodes are likely reactive. Reproductive: Prostate gland is enlarged measuring 5.7 x 5.0 cm causing mass effect on the bladder base. Other: Calcified over density in the central pelvis may be calcified lymph node or sequela of prior fat necrosis. There is fat within both inguinal canals. Tiny fat containing umbilical hernia. Musculoskeletal: There are no acute or suspicious osseous abnormalities. IMPRESSION: 1. Right hydronephrosis. There are 3 stones in the right mid ureter, 2 adjacent measuring 4 and 3 mm, with a slightly more distal punctate stone. 2. Additional nonobstructing right renal calculi. 3. Hepatic steatosis. 4. Lung base evaluation demonstrates bronchial thickening of the right bronchus intermedius, lower and middle lobes. Associated atelectasis and tree in bud opacities. Findings suggest bronchial filling process, aspiration or mucoid plugging with adjacent postobstructive atelectasis and bronchiolitis. 5. Additional incidental findings include bilateral renal cysts and enlarged prostate gland. Aortic atherosclerosis and coronary artery calcifications. Electronically Signed   By: Jeb Levering M.D.   On: 07/31/2016 03:17    Assessment/Plan Active Problems:   Benign hypertension   Rectal bleeding   Uncontrolled type 2 diabetes mellitus with hyperglycemia ([place])   Urolithiasis    1. Rectal bleeding - likely lower GI. Patient states he does not take any aspirin (though mentioned in his medication list) or any NSAIDs. Patient has had EGD and colonoscopy in 2013 by Dr. Olevia Perches which was showing sessile polyp in the colonoscopy and hemorrhagic gastritis in the EGD. Will keep patient nothing  by mouth and check frequent CBCs. Type and screen. Consult gastroenterologist in a.m. 2. Right-sided multiple ureteric stones with hydronephrosis - UA is pending. I have discussed with Dr. Louis Meckel urologist on call who will be seeing patient in consult. If the UA shows signs of infection patient will need to be on antibiotics. Patient will be continued on IV fluids. Pain relieving medications. 3. Diabetes mellitus type 2 with hyperglycemia - since patient is nothing by mouth I will keep patient on sliding scale coverage. Closely follow CBG trends. 4. Hypertension - I have placed patient on scheduled dose of IV metoprolol since patient takes oral metoprolol.. On IV hydralazine for systolic blood pressure more than 160. 5. Acute blood loss anemia - follow CBC closely. 6. Hyperlipidemia -  continue statins when patient can take orally.  Since patient's CT scan was showing abnormalities in the chest I have ordered a two-view chest x-ray. Patient at this time is not in respiratory distress.   DVT prophylaxis: SCDs. Code Status: Full code.  Family Communication: Discussed with patient.  Disposition Plan: Home.  Consults called: Urologist.  Admission status: Observation.    Rise Patience MD [facility] Pager [phone].  If 7PM-7AM, please contact night-coverage [email or web address] Password [facility]  07/31/2016, 6:27 AM

## 2016-07-31 NOTE — Clinical Social Work Note (Signed)
Clinical Social Work Assessment  Patient Details  Name: [name] MRN: [id number] Date of Birth: Mar 14, 1956  Date of referral:  07/31/16               Reason for consult:  Discharge Planning                Permission sought to share information with:  Chartered certified accountant granted to share information::  Yes, Verbal Permission Granted  Name::        Agency::  [facility]  Relationship::     Contact Information:     Housing/Transportation Living arrangements for the past 2 months:  [place] (has been in rehab x 5weeks, prior to that living independently) Source of Information:  Patient Patient Interpreter Needed:  None Criminal Activity/Legal Involvement Pertinent to Current Situation/Hospitalization:  No - Comment as needed Significant Relationships:  Warehouse manager, Siblings Lives with:  Self Do you feel safe going back to the place where you live?  Yes Need for family participation in patient care:  No (Coment)  Care giving concerns: pt from [facility] SNF. Reports he has been in rehab x 5 weeks, was admitted from community after leg injury for rehab. States he is working with PT there and making gradual improvement. Is receiving assistance with bathing/ dressing, eats independently. Hopes to return at [place] to continue rehab prior to eventually going back home.    Social Worker assessment / plan: CSW consulted as pt is from SNF. Met with pt at bedside, explained role. Pt receptive and plans to return to [place] to continue rehab at [place] contacted facility. Will provide information including FL2 through the [facility].   Plan: return to [facility] SNF.   Employment status:  Retired Forensic scientist:  Information systems manager, Medicaid In [place] PT Recommendations:  Not assessed at this time Information / Referral to community resources:     Patient/Family's Response to care:  Pt appreciative of care.  Patient/Family's Understanding of  and Emotional Response to Diagnosis, Current Treatment, and Prognosis:  Pt demonstrates adequate understanding of status and plan. States he is encouraged with the progress he has made in rehab and hopes to continue and return home soon.   Emotional Assessment Appearance:  Appears stated age Attitude/Demeanor/Rapport:   (pleasant) Affect (typically observed):  Calm Orientation:  Oriented to Self, Oriented to Place, Oriented to  Time, Oriented to Situation Alcohol / Substance use:  Not Applicable Psych involvement (Current and /or in the community):  No (Comment)  Discharge Needs  Concerns to be addressed:  Discharge Planning Concerns Readmission within the last 30 days:  No Current discharge risk:  None Barriers to Discharge:  Continued Medical Work up   [employer], LCSW 07/31/2016, 1:45 PM  [phone]

## 2016-07-31 NOTE — Anesthesia Procedure Notes (Signed)
Procedure Name: Intubation Date/Time: 07/31/2016 7:55 PM Performed by: Anne Fu Pre-anesthesia Checklist: Patient identified, Emergency Drugs available, Suction available, Patient being monitored and Timeout performed Patient Re-evaluated:Patient Re-evaluated prior to inductionOxygen Delivery Method: Circle system utilized Preoxygenation: Pre-oxygenation with 100% oxygen Intubation Type: IV induction Ventilation: Mask ventilation without difficulty Laryngoscope Size: Mac and 4 Grade View: Grade I Tube type: Oral Tube size: 7.5 mm Number of attempts: 1 Airway Equipment and Method: Stylet Placement Confirmation: ETT inserted through vocal cords under direct vision,  positive ETCO2,  CO2 detector and breath sounds checked- equal and bilateral Secured at: 24 cm Tube secured with: Tape Dental Injury: Teeth and Oropharynx as per pre-operative assessment

## 2016-08-01 ENCOUNTER — Encounter (HOSPITAL_COMMUNITY): Payer: Self-pay | Admitting: Urology

## 2016-08-01 DIAGNOSIS — H3530 Unspecified macular degeneration: Secondary | ICD-10-CM | POA: Diagnosis present

## 2016-08-01 DIAGNOSIS — E785 Hyperlipidemia, unspecified: Secondary | ICD-10-CM | POA: Diagnosis not present

## 2016-08-01 DIAGNOSIS — Z8249 Family history of ischemic heart disease and other diseases of the circulatory system: Secondary | ICD-10-CM | POA: Diagnosis not present

## 2016-08-01 DIAGNOSIS — E669 Obesity, unspecified: Secondary | ICD-10-CM | POA: Diagnosis not present

## 2016-08-01 DIAGNOSIS — Y838 Other surgical procedures as the cause of abnormal reaction of the patient, or of later complication, without mention of misadventure at the time of the procedure: Secondary | ICD-10-CM | POA: Diagnosis not present

## 2016-08-01 DIAGNOSIS — F411 Generalized anxiety disorder: Secondary | ICD-10-CM | POA: Diagnosis not present

## 2016-08-01 DIAGNOSIS — D72829 Elevated white blood cell count, unspecified: Secondary | ICD-10-CM | POA: Diagnosis not present

## 2016-08-01 DIAGNOSIS — Z800 Family history of malignant neoplasm of digestive organs: Secondary | ICD-10-CM | POA: Diagnosis not present

## 2016-08-01 DIAGNOSIS — S3710XD Unspecified injury of ureter, subsequent encounter: Secondary | ICD-10-CM | POA: Diagnosis not present

## 2016-08-01 DIAGNOSIS — K5900 Constipation, unspecified: Secondary | ICD-10-CM | POA: Diagnosis present

## 2016-08-01 DIAGNOSIS — K625 Hemorrhage of anus and rectum: Secondary | ICD-10-CM | POA: Diagnosis not present

## 2016-08-01 DIAGNOSIS — K9189 Other postprocedural complications and disorders of digestive system: Secondary | ICD-10-CM | POA: Diagnosis not present

## 2016-08-01 DIAGNOSIS — N132 Hydronephrosis with renal and ureteral calculous obstruction: Secondary | ICD-10-CM | POA: Diagnosis not present

## 2016-08-01 DIAGNOSIS — N139 Obstructive and reflux uropathy, unspecified: Secondary | ICD-10-CM | POA: Diagnosis not present

## 2016-08-01 DIAGNOSIS — I10 Essential (primary) hypertension: Secondary | ICD-10-CM | POA: Diagnosis not present

## 2016-08-01 DIAGNOSIS — K219 Gastro-esophageal reflux disease without esophagitis: Secondary | ICD-10-CM | POA: Diagnosis present

## 2016-08-01 DIAGNOSIS — K922 Gastrointestinal hemorrhage, unspecified: Secondary | ICD-10-CM | POA: Diagnosis not present

## 2016-08-01 DIAGNOSIS — S3719XA Other injury of ureter, initial encounter: Secondary | ICD-10-CM | POA: Diagnosis not present

## 2016-08-01 DIAGNOSIS — K567 Ileus, unspecified: Secondary | ICD-10-CM | POA: Diagnosis not present

## 2016-08-01 DIAGNOSIS — Z6832 Body mass index (BMI) 32.0-32.9, adult: Secondary | ICD-10-CM | POA: Diagnosis not present

## 2016-08-01 DIAGNOSIS — C61 Malignant neoplasm of prostate: Secondary | ICD-10-CM | POA: Diagnosis not present

## 2016-08-01 DIAGNOSIS — F329 Major depressive disorder, single episode, unspecified: Secondary | ICD-10-CM | POA: Diagnosis present

## 2016-08-01 DIAGNOSIS — N201 Calculus of ureter: Secondary | ICD-10-CM | POA: Diagnosis not present

## 2016-08-01 DIAGNOSIS — E119 Type 2 diabetes mellitus without complications: Secondary | ICD-10-CM | POA: Diagnosis not present

## 2016-08-01 DIAGNOSIS — D62 Acute posthemorrhagic anemia: Secondary | ICD-10-CM | POA: Diagnosis not present

## 2016-08-01 DIAGNOSIS — N200 Calculus of kidney: Secondary | ICD-10-CM | POA: Diagnosis not present

## 2016-08-01 DIAGNOSIS — Z801 Family history of malignant neoplasm of trachea, bronchus and lung: Secondary | ICD-10-CM | POA: Diagnosis not present

## 2016-08-01 DIAGNOSIS — S82255D Nondisplaced comminuted fracture of shaft of left tibia, subsequent encounter for closed fracture with routine healing: Secondary | ICD-10-CM | POA: Diagnosis not present

## 2016-08-01 DIAGNOSIS — Y92234 Operating room of hospital as the place of occurrence of the external cause: Secondary | ICD-10-CM | POA: Diagnosis not present

## 2016-08-01 DIAGNOSIS — Z8546 Personal history of malignant neoplasm of prostate: Secondary | ICD-10-CM | POA: Diagnosis not present

## 2016-08-01 DIAGNOSIS — E1165 Type 2 diabetes mellitus with hyperglycemia: Secondary | ICD-10-CM | POA: Diagnosis not present

## 2016-08-01 DIAGNOSIS — K649 Unspecified hemorrhoids: Secondary | ICD-10-CM | POA: Diagnosis present

## 2016-08-01 DIAGNOSIS — D509 Iron deficiency anemia, unspecified: Secondary | ICD-10-CM | POA: Diagnosis not present

## 2016-08-01 DIAGNOSIS — Z833 Family history of diabetes mellitus: Secondary | ICD-10-CM | POA: Diagnosis not present

## 2016-08-01 DIAGNOSIS — Z808 Family history of malignant neoplasm of other organs or systems: Secondary | ICD-10-CM | POA: Diagnosis not present

## 2016-08-01 LAB — CBC
HEMATOCRIT: 42.4 % (ref 39.0–52.0)
Hemoglobin: 14 g/dL (ref 13.0–17.0)
MCH: 29 pg (ref 26.0–34.0)
MCHC: 33 g/dL (ref 30.0–36.0)
MCV: 87.8 fL (ref 78.0–100.0)
Platelets: 251 10*3/uL (ref 150–400)
RBC: 4.83 MIL/uL (ref 4.22–5.81)
RDW: 14 % (ref 11.5–15.5)
WBC: 18.2 10*3/uL — ABNORMAL HIGH (ref 4.0–10.5)

## 2016-08-01 LAB — GLUCOSE, CAPILLARY
GLUCOSE-CAPILLARY: 176 mg/dL — AB (ref 65–99)
GLUCOSE-CAPILLARY: 177 mg/dL — AB (ref 65–99)
GLUCOSE-CAPILLARY: 261 mg/dL — AB (ref 65–99)
GLUCOSE-CAPILLARY: 284 mg/dL — AB (ref 65–99)
Glucose-Capillary: 255 mg/dL — ABNORMAL HIGH (ref 65–99)
Glucose-Capillary: 266 mg/dL — ABNORMAL HIGH (ref 65–99)
Glucose-Capillary: 174 mg/dL — ABNORMAL HIGH (ref 65–99)

## 2016-08-01 LAB — BASIC METABOLIC PANEL
Anion gap: 14 (ref 5–15)
BUN: 17 mg/dL (ref 6–20)
CHLORIDE: 104 mmol/L (ref 101–111)
CO2: 19 mmol/L — AB (ref 22–32)
CREATININE: 1.4 mg/dL — AB (ref 0.61–1.24)
Calcium: 8.3 mg/dL — ABNORMAL LOW (ref 8.9–10.3)
GFR calc Af Amer: >60 mL/min (ref >60)
GFR calc non Af Amer: 52 mL/min — ABNORMAL LOW (ref >60)
Glucose, Bld: 315 mg/dL — ABNORMAL HIGH (ref 65–99)
POTASSIUM: 4.4 mmol/L (ref 3.5–5.1)
Sodium: 137 mmol/L (ref 135–145)

## 2016-08-01 LAB — HIV ANTIBODY (ROUTINE TESTING W REFLEX): HIV Screen 4th Generation wRfx: NONREACTIVE

## 2016-08-01 MED ORDER — SUGAMMADEX SODIUM 200 MG/2ML IV SOLN
INTRAVENOUS | Status: DC | PRN
  Administered 2016-08-01: 200 mg via INTRAVENOUS

## 2016-08-01 MED ORDER — METFORMIN HCL 500 MG PO TABS
1000.0000 mg | ORAL_TABLET | Freq: Two times a day (BID) | ORAL | Status: DC
  Administered 2016-08-01: 1000 mg via ORAL
  Filled 2016-08-01 (×2): qty 2

## 2016-08-01 MED ORDER — OXYCODONE HCL 5 MG PO TABS
5.0000 mg | ORAL_TABLET | ORAL | Status: DC | PRN
  Administered 2016-08-01 – 2016-08-02 (×2): 5 mg via ORAL
  Filled 2016-08-01 (×2): qty 1

## 2016-08-01 MED ORDER — DIPHENHYDRAMINE HCL 12.5 MG/5ML PO ELIX: 12.5000 mg | ORAL_SOLUTION | Freq: Four times a day (QID) | ORAL | Status: DC | PRN

## 2016-08-01 MED ORDER — SERTRALINE HCL 50 MG PO TABS
50.0000 mg | ORAL_TABLET | Freq: Every day | ORAL | Status: DC
  Administered 2016-08-01 – 2016-08-06 (×6): 50 mg via ORAL
  Filled 2016-08-01 (×6): qty 1

## 2016-08-01 MED ORDER — ASPIRIN EC 81 MG PO TBEC
81.0000 mg | DELAYED_RELEASE_TABLET | Freq: Every day | ORAL | Status: DC
  Administered 2016-08-01 – 2016-08-08 (×8): 81 mg via ORAL
  Filled 2016-08-01 (×8): qty 1

## 2016-08-01 MED ORDER — ONDANSETRON HCL 4 MG/2ML IJ SOLN: 4.0000 mg | Freq: Four times a day (QID) | INTRAMUSCULAR | Status: DC | PRN

## 2016-08-01 MED ORDER — DIPHENHYDRAMINE HCL 50 MG/ML IJ SOLN: 12.5000 mg | Freq: Four times a day (QID) | INTRAMUSCULAR | Status: DC | PRN

## 2016-08-01 MED ORDER — HEPARIN SODIUM (PORCINE) 5000 UNIT/ML IJ SOLN
5000.0000 [IU] | Freq: Three times a day (TID) | INTRAMUSCULAR | Status: DC
  Administered 2016-08-01 – 2016-08-08 (×22): 5000 [IU] via SUBCUTANEOUS
  Filled 2016-08-01 (×22): qty 1

## 2016-08-01 MED ORDER — HYDROMORPHONE HCL 1 MG/ML IJ SOLN
INTRAMUSCULAR | Status: AC
  Filled 2016-08-01: qty 0.5

## 2016-08-01 MED ORDER — FENTANYL CITRATE (PF) 100 MCG/2ML IJ SOLN
50.0000 ug | INTRAMUSCULAR | Status: DC | PRN
  Administered 2016-08-01 (×2): 50 ug via INTRAVENOUS
  Filled 2016-08-01 (×2): qty 2

## 2016-08-01 MED ORDER — LORAZEPAM 0.5 MG PO TABS
0.5000 mg | ORAL_TABLET | Freq: Every evening | ORAL | Status: DC | PRN
  Administered 2016-08-01: 0.5 mg via ORAL
  Filled 2016-08-01: qty 1

## 2016-08-01 MED ORDER — LOSARTAN POTASSIUM 50 MG PO TABS
100.0000 mg | ORAL_TABLET | Freq: Every day | ORAL | Status: DC
  Administered 2016-08-01 – 2016-08-08 (×8): 100 mg via ORAL
  Filled 2016-08-01 (×8): qty 2

## 2016-08-01 MED ORDER — NALOXONE HCL 0.4 MG/ML IJ SOLN: 0.4000 mg | INTRAMUSCULAR | Status: DC | PRN

## 2016-08-01 MED ORDER — AMLODIPINE BESYLATE 5 MG PO TABS
5.0000 mg | ORAL_TABLET | Freq: Every day | ORAL | Status: DC
  Administered 2016-08-02 – 2016-08-08 (×7): 5 mg via ORAL
  Filled 2016-08-01 (×7): qty 1

## 2016-08-01 MED ORDER — BELLADONNA ALKALOIDS-OPIUM 16.2-60 MG RE SUPP
1.0000 | Freq: Every day | RECTAL | Status: DC
  Administered 2016-08-01: 1 via RECTAL

## 2016-08-01 MED ORDER — METOPROLOL TARTRATE 50 MG PO TABS
50.0000 mg | ORAL_TABLET | Freq: Every day | ORAL | Status: DC
  Administered 2016-08-01 – 2016-08-07 (×7): 50 mg via ORAL
  Filled 2016-08-01 (×6): qty 1
  Filled 2016-08-01: qty 2

## 2016-08-01 MED ORDER — ACETAMINOPHEN 10 MG/ML IV SOLN
INTRAVENOUS | Status: AC
  Filled 2016-08-01: qty 100

## 2016-08-01 MED ORDER — ONDANSETRON HCL 4 MG/2ML IJ SOLN
4.0000 mg | Freq: Four times a day (QID) | INTRAMUSCULAR | Status: DC | PRN
  Administered 2016-08-04: 4 mg via INTRAVENOUS

## 2016-08-01 MED ORDER — HYDROMORPHONE 1 MG/ML IV SOLN
INTRAVENOUS | Status: DC
  Administered 2016-08-01: 2.7 mg via INTRAVENOUS
  Administered 2016-08-01: 3.2 mg via INTRAVENOUS
  Administered 2016-08-01: 25 mg via INTRAVENOUS
  Administered 2016-08-01: 0.9 mg via INTRAVENOUS
  Administered 2016-08-02: 2.4 mg via INTRAVENOUS
  Administered 2016-08-02: 25 mg via INTRAVENOUS
  Administered 2016-08-02: 2.1 mg via INTRAVENOUS
  Administered 2016-08-02: 3 mg via INTRAVENOUS
  Administered 2016-08-02: 2.1 mg via INTRAVENOUS
  Administered 2016-08-02: 3.6 mg via INTRAVENOUS
  Administered 2016-08-02: 2.4 mg via INTRAVENOUS
  Administered 2016-08-03: 2.7 mg via INTRAVENOUS
  Administered 2016-08-03 (×2): 0.9 mg via INTRAVENOUS
  Administered 2016-08-03: 2.1 mg via INTRAVENOUS
  Administered 2016-08-03: 2.4 mg via INTRAVENOUS
  Administered 2016-08-03: 1.5 mg via INTRAVENOUS
  Administered 2016-08-04: 3 mg via INTRAVENOUS
  Administered 2016-08-04: 0.6 mg via INTRAVENOUS
  Administered 2016-08-04: 1.5 mg via INTRAVENOUS
  Filled 2016-08-01 (×2): qty 25

## 2016-08-01 MED ORDER — BELLADONNA ALKALOIDS-OPIUM 16.2-60 MG RE SUPP
RECTAL | Status: AC
  Filled 2016-08-01: qty 1

## 2016-08-01 MED ORDER — DIPHENHYDRAMINE HCL 50 MG/ML IJ SOLN
12.5000 mg | Freq: Four times a day (QID) | INTRAMUSCULAR | Status: DC | PRN
  Administered 2016-08-03: 12.5 mg via INTRAVENOUS
  Filled 2016-08-01: qty 1

## 2016-08-01 MED ORDER — INSULIN ASPART 100 UNIT/ML ~~LOC~~ SOLN
0.0000 [IU] | SUBCUTANEOUS | Status: DC
  Administered 2016-08-01 (×2): 3 [IU] via SUBCUTANEOUS
  Administered 2016-08-02 (×2): 2 [IU] via SUBCUTANEOUS
  Administered 2016-08-02 (×3): 3 [IU] via SUBCUTANEOUS
  Administered 2016-08-02: 5 [IU] via SUBCUTANEOUS
  Administered 2016-08-03: 2 [IU] via SUBCUTANEOUS
  Administered 2016-08-03: 5 [IU] via SUBCUTANEOUS
  Administered 2016-08-03 (×3): 2 [IU] via SUBCUTANEOUS
  Administered 2016-08-03 – 2016-08-04 (×2): 3 [IU] via SUBCUTANEOUS
  Administered 2016-08-04 (×2): 2 [IU] via SUBCUTANEOUS
  Administered 2016-08-04: 5 [IU] via SUBCUTANEOUS
  Administered 2016-08-05: 3 [IU] via SUBCUTANEOUS
  Administered 2016-08-05 (×4): 2 [IU] via SUBCUTANEOUS
  Administered 2016-08-06 (×3): 3 [IU] via SUBCUTANEOUS
  Administered 2016-08-06: 2 [IU] via SUBCUTANEOUS
  Administered 2016-08-06: 3 [IU] via SUBCUTANEOUS
  Administered 2016-08-06 – 2016-08-07 (×3): 2 [IU] via SUBCUTANEOUS
  Administered 2016-08-07: 3 [IU] via SUBCUTANEOUS
  Administered 2016-08-07: 2 [IU] via SUBCUTANEOUS
  Administered 2016-08-08: 3 [IU] via SUBCUTANEOUS
  Administered 2016-08-08 (×2): 2 [IU] via SUBCUTANEOUS

## 2016-08-01 MED ORDER — OXYCODONE HCL 5 MG PO TABS
5.0000 mg | ORAL_TABLET | ORAL | Status: DC | PRN
  Administered 2016-08-01: 5 mg via ORAL
  Filled 2016-08-01: qty 1

## 2016-08-01 MED ORDER — INSULIN GLARGINE 100 UNIT/ML ~~LOC~~ SOLN
12.0000 [IU] | Freq: Every day | SUBCUTANEOUS | Status: DC
  Administered 2016-08-01 – 2016-08-07 (×7): 12 [IU] via SUBCUTANEOUS
  Filled 2016-08-01 (×8): qty 0.12

## 2016-08-01 MED ORDER — INSULIN ASPART 100 UNIT/ML ~~LOC~~ SOLN
0.0000 [IU] | SUBCUTANEOUS | Status: DC
  Administered 2016-08-01: 5 [IU] via SUBCUTANEOUS
  Administered 2016-08-01: 2 [IU] via SUBCUTANEOUS

## 2016-08-01 MED ORDER — SODIUM CHLORIDE 0.9% FLUSH: 9.0000 mL | INTRAVENOUS | Status: DC | PRN

## 2016-08-01 MED ORDER — ACETAMINOPHEN 10 MG/ML IV SOLN
INTRAVENOUS | Status: DC | PRN
  Administered 2016-08-01: 1000 mg via INTRAVENOUS

## 2016-08-01 MED ORDER — LOSARTAN POTASSIUM-HCTZ 100-25 MG PO TABS: 1.0000 | ORAL_TABLET | Freq: Every day | ORAL | Status: DC

## 2016-08-01 MED ORDER — HYDROCHLOROTHIAZIDE 25 MG PO TABS
25.0000 mg | ORAL_TABLET | Freq: Every day | ORAL | Status: DC
  Administered 2016-08-01 – 2016-08-08 (×8): 25 mg via ORAL
  Filled 2016-08-01 (×7): qty 1

## 2016-08-01 NOTE — NC FL2 (Signed)
[place] LEVEL OF CARE SCREENING TOOL     IDENTIFICATION  Patient Name: [name] Birthdate: Mar 09, 1956 Sex: male Admission Date (Current Location): 07/31/2016  [facility] and [place] Number:  Herbalist and Address:  [facility],  [facility] [place], [place]      Provider Number: [id number]  Attending Physician Name and Address:  [name], MD  Relative Name and Phone Number:       Current Level of Care: Hospital Recommended Level of Care: [place] Prior Approval Number:    Date Approved/Denied:   PASRR Number: [id number]  Discharge Plan: SNF    Current Diagnoses: Patient Active Problem List   Diagnosis Date Noted  . Nephrolithiasis 08/01/2016  . Ureteral trauma, subsequent encounter 08/01/2016  . Rectal bleeding 07/31/2016  . Uncontrolled type 2 diabetes mellitus with hyperglycemia ([place]) 07/31/2016  . Urolithiasis 07/31/2016  . Right ureteral stone   . Type 2 diabetes mellitus without complication, without long-term current use of insulin ([place]) 05/29/2016  . Dyslipidemia associated with type 2 diabetes mellitus ([place]) 05/29/2016  . Closed nondisplaced spiral fracture of shaft of right tibia   . Right tibial fracture 03/22/2016  . Microcytic anemia 02/17/2012  . Prostate cancer ([place]) 02/17/2012  . 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 Benign hypertension 02/17/2012  . Depressive disorder 02/17/2012    Orientation RESPIRATION BLADDER Height & Weight     Self, Time, Situation, Place  O2 (3L) Indwelling catheter Weight: 190 lb (86.2 kg) Height:  5\' 6"  (167.6 cm)  BEHAVIORAL SYMPTOMS/MOOD NEUROLOGICAL BOWEL NUTRITION STATUS      Incontinent Diet (see DC summary)  AMBULATORY STATUS COMMUNICATION OF NEEDS Skin   Extensive Assist Verbally Surgical wounds                       Personal Care Assistance Level of Assistance  Bathing, Feeding, Dressing Bathing Assistance: Limited  assistance Feeding assistance: Independent Dressing Assistance: Limited assistance     Functional Limitations Info  Sight, Hearing, Speech Sight Info: Adequate Hearing Info: Adequate Speech Info: Adequate    SPECIAL CARE FACTORS FREQUENCY  PT (By licensed PT), OT (By licensed OT)     PT Frequency: 5x OT Frequency: 5x            Contractures Contractures Info: Not present    Additional Factors Info  Code Status, Allergies Code Status Info: full Allergies Info: nka           Current Medications (08/01/2016):  This is the current hospital active medication list Current Facility-Administered Medications  Medication Dose Route Frequency Provider Last Rate Last Dose  . 0.9 %  sodium chloride infusion   Intravenous Continuous Rolland Porter, MD 100 mL/hr at 08/01/16 1006    . amLODipine (NORVASC) tablet 5 mg  5 mg Oral Daily [name], MD      . aspirin EC tablet 81 mg  81 mg Oral Daily [name], MD   81 mg at 08/01/16 0956  . diphenhydrAMINE (BENADRYL) injection 12.5 mg  12.5 mg Intravenous Q6H PRN [name], MD       Or  . diphenhydrAMINE (BENADRYL) 12.5 MG/5ML elixir 12.5 mg  12.5 mg Oral Q6H PRN [name], MD      . diphenhydrAMINE (BENADRYL) injection 12.5 mg  12.5 mg Intravenous Q6H PRN [name], MD       Or  . diphenhydrAMINE (BENADRYL) 12.5 MG/5ML elixir  12.5 mg  12.5 mg Oral Q6H PRN [name], MD      . heparin injection 5,000 Units  5,000 Units Subcutaneous Q8H [name], MD   5,000 Units at 08/01/16 [phone]  . hydrALAZINE (APRESOLINE) injection 10 mg  10 mg Intravenous Q4H PRN Rise Patience, MD      . losartan (COZAAR) tablet 100 mg  100 mg Oral Daily [name], MD       And  . hydrochlorothiazide (HYDRODIURIL) tablet 25 mg  25 mg Oral Daily [name], MD      . HYDROmorphone (DILAUDID) 1 mg/mL PCA injection   Intravenous Q4H [name], MD   25 mg at 08/01/16 [phone]   . insulin aspart (novoLOG) injection 0-15 Units  0-15 Units Subcutaneous Q4H Annita Brod, MD      . LORazepam (ATIVAN) tablet 0.5 mg  0.5 mg Oral QHS PRN [name], MD      . metoprolol tartrate (LOPRESSOR) tablet 50 mg  50 mg Oral Daily [name], MD   50 mg at 08/01/16 0955  . naloxone [facility]) injection 0.4 mg  0.4 mg Intravenous PRN [name], MD       And  . sodium chloride flush (NS) 0.9 % injection 9 mL  9 mL Intravenous PRN [name], MD      . naloxone [facility]) injection 0.4 mg  0.4 mg Intravenous PRN [name], MD       And  . sodium chloride flush (NS) 0.9 % injection 9 mL  9 mL Intravenous PRN [name], MD      . ondansetron [facility]) tablet 4 mg  4 mg Oral Q6H PRN Rise Patience, MD       Or  . ondansetron [facility]) injection 4 mg  4 mg Intravenous Q6H PRN Rise Patience, MD   4 mg at 07/31/16 2312  . ondansetron (ZOFRAN) injection 4 mg  4 mg Intravenous Q6H PRN [name], MD      . oxyCODONE (Oxy IR/ROXICODONE) immediate release tablet 5-10 mg  5-10 mg Oral Q4H PRN [name], MD      . sertraline (ZOLOFT) tablet 50 mg  50 mg Oral Daily [name], MD   50 mg at 08/01/16 0347     Discharge Medications: Please see discharge summary for a list of discharge medications.  Relevant Imaging Results:  Relevant Lab Results:   Additional Information SSN [id number]  Nila Nephew, LCSW

## 2016-08-01 NOTE — Care Management Note (Signed)
Case Management Note  Patient Details  Name: [name] MRN: [id number] Date of Birth: 03-May-1955  Subjective/Objective:             hydroureteronephrosis with obstructing kidney stones       Action/Plan: Date:  Aug 01, 2016  Chart reviewed for concurrent status and case management needs.  Will continue to follow patient progress.  Discharge Planning: following for needs  Expected discharge date: [id number]  Velva Harman, BSN, [place], [place]   Expected Discharge Date:   (unknown)               Expected Discharge Plan:  Home/Self Care  In-House Referral:     Discharge planning Services  CM Consult  Post Acute Care Choice:    Choice offered to:     DME Arranged:    DME Agency:     HH Arranged:    [facility] Agency:     Status of Service:  Completed, signed off  If discussed at [employer] of Stay Meetings, dates discussed:    Additional Comments:  Leeroy Cha, RN 08/01/2016, 9:07 AM

## 2016-08-01 NOTE — Progress Notes (Signed)
PROGRESS NOTE  [name] XHB:[id number] DOB: 1956/01/21 DOA: 07/31/2016 PCP: [name]  HPI/Recap of past 24 hours:  HPI: 61 year old male with past mental history of hypertension and diabetes mellitus presented to the emergency room with complains of rectal bleeding. His had this before felt to be second to hemorrhoids and possibly diverticulosis. Hemoglobin on admission stable however CT scan of abdomen and pelvis noted right-sided hydronephrosis with 3 ureteric stones.  Patient was seen by urology who took patient for cystoscopy with planned stent placement. Unfortunately, it was discovered during a procedure that patient had avulsed ureter and required open emergency surgery to repair. Following, patient transferred to step down unit.  This morning, patient complains of some abdominal pain and soreness, mildly managed by PCA pump. Otherwise, he is stable, alert and oriented. Does not complain of shortness of breath, but taking deep breaths does cause abdominal pain.  Assessment/Plan: Active Problems:   Rectal bleeding in patient with history of Microcytic anemia: Hemoglobin stable. Likely hemorrhoidal   Benign hypertension: Stable    Uncontrolled type 2 diabetes mellitus with hyperglycemia [facility]): Appreciate diabetes coordinator help. We'll add Lantus and increase NovoLog.    Nephrolithiasis causing right-sided hydronephrosis and then Ureteral trauma, subsequent encounter: Stones removed. Ureter repaired. Patient overall stable. Appreciate everything urology has done. Continue Foley. Diet slowly advanced as per urology  Leukocytosis: Likely stress margination in the setting of emergency surgery. No fever currently. Not on antibiotics currently. Repeat labs in the morning. Monitor for fever  Code Status: Full code   Family Communication: Left message with sister   Disposition Plan: Improving. Transfer to floor    Consultants:  Urology   Procedures:  Initial cystoscopy with  plans for stent placement, changed to emergency surgery with ureter repair done 5/16   Antimicrobials:  IV Rocephin 15/16   DVT prophylaxis:  SCDs   Objective: Vitals:   08/01/16 1000 08/01/16 1100 08/01/16 1200 08/01/16 1206  BP: 116/64 136/65    Pulse:      Resp: 17 17 18    Temp:    98.4 F (36.9 C)  TempSrc:    Oral  SpO2: 94% 94% 96%   Weight:      Height:        Intake/Output Summary (Last 24 hours) at 08/01/16 1311 Last data filed at 08/01/16 0842  Gross per 24 hour  Intake             3920 ml  Output             2270 ml  Net             1650 ml   Filed Weights   07/31/16 0036  Weight: 86.2 kg (190 lb)    Exam:   General:  Alert and oriented 3, mild distress from pain   Cardiovascular: Regular rate and rhythm, S1-S2   Respiratory: Moderate inspiratory effort, otherwise clear to auscultation bilaterally   Abdomen: Soft, distended, wound covered with a drain, few bowel sounds   Musculoskeletal: No clubbing or cyanosis or edema   Skin: Other than side of abdominal surgery, no skin breaks, tears or lesions  Psychiatry: Patient is appropriate, no evidence of psychoses    Data Reviewed: CBC:  Recent Labs Lab 07/31/16 0037 07/31/16 1154 08/01/16 0324  WBC 12.9* 9.1 18.2*  HGB 15.8 13.6 14.0  HCT 44.5 41.2 42.4  MCV 85.2 86.4 87.8  PLT 288 249 810   Basic Metabolic Panel:  Recent Labs Lab 07/31/16  0037 07/31/16 1154 08/01/16 0324  NA 136 138 137  K 3.8 3.8 4.4  CL 100* 105 104  CO2 23 25 19*  GLUCOSE 253* 148* 315*  BUN 22* 20 17  CREATININE 1.22 1.37* 1.40*  CALCIUM 9.8 8.8* 8.3*   GFR: Estimated Creatinine Clearance: 56.3 mL/min (A) (by C-G formula based on SCr of 1.4 mg/dL (H)). Liver Function Tests:  Recent Labs Lab 07/31/16 0037  AST 34  ALT 43  ALKPHOS 95  BILITOT 0.6  PROT 8.1  ALBUMIN 4.2   No results for input(s): LIPASE, AMYLASE in the last 168 hours. No results for input(s): AMMONIA in the last 168  hours. Coagulation Profile:  Recent Labs Lab 07/31/16 0129  INR 0.94   Cardiac Enzymes: No results for input(s): CKTOTAL, CKMB, CKMBINDEX, TROPONINI in the last 168 hours. BNP (last 3 results) No results for input(s): PROBNP in the last 8760 hours. HbA1C: No results for input(s): HGBA1C in the last 72 hours. CBG:  Recent Labs Lab 07/31/16 1655 08/01/16 0101 08/01/16 0640 08/01/16 0736 08/01/16 0937  GLUCAP 134* 284* 261* 255* 266*   Lipid Profile: No results for input(s): CHOL, HDL, LDLCALC, TRIG, CHOLHDL, LDLDIRECT in the last 72 hours. Thyroid Function Tests: No results for input(s): TSH, T4TOTAL, FREET4, T3FREE, THYROIDAB in the last 72 hours. Anemia Panel: No results for input(s): VITAMINB12, FOLATE, FERRITIN, TIBC, IRON, RETICCTPCT in the last 72 hours. Urine analysis:    Component Value Date/Time   COLORURINE YELLOW 07/31/2016 0625   APPEARANCEUR CLEAR 07/31/2016 0625   LABSPEC 1.016 07/31/2016 0625   PHURINE 6.0 07/31/2016 0625   GLUCOSEU >=500 (A) 07/31/2016 0625   HGBUR NEGATIVE 07/31/2016 0625   BILIRUBINUR NEGATIVE 07/31/2016 0625   KETONESUR NEGATIVE 07/31/2016 0625   PROTEINUR NEGATIVE 07/31/2016 0625   UROBILINOGEN 0.2 04/18/2008 1153   NITRITE NEGATIVE 07/31/2016 0625   LEUKOCYTESUR NEGATIVE 07/31/2016 0625   Sepsis Labs: @LABRCNTIP (procalcitonin:4,lacticidven:4)  ) Recent Results (from the past 240 hour(s))  Surgical PCR screen     Status: None   Collection Time: 07/31/16  1:25 PM  Result Value Ref Range Status   MRSA, PCR NEGATIVE NEGATIVE Final   Staphylococcus aureus NEGATIVE NEGATIVE Final    Comment:        The Xpert SA Assay (FDA approved for NASAL specimens in patients over 2 years of age), is one component of a comprehensive surveillance program.  Test performance has been validated by [facility] for patients greater than or equal to 51 year old. It is not intended to diagnose infection nor to guide or monitor treatment.        Studies: Dg C-arm 1-60 Min-no Report  Result Date: 07/31/2016 Fluoroscopy was utilized by the requesting physician.  No radiographic interpretation.    Scheduled Meds: . aspirin EC  81 mg Oral Daily  . heparin  5,000 Units Subcutaneous Q8H  . HYDROmorphone      . HYDROmorphone   Intravenous Q4H  . insulin aspart  0-9 Units Subcutaneous Q4H  . metFORMIN  1,000 mg Oral BID WC  . metoprolol tartrate  5 mg Intravenous Q8H  . metoprolol tartrate  50 mg Oral Daily  . sertraline  50 mg Oral Daily    Continuous Infusions: . sodium chloride 100 mL/hr at 08/01/16 1006     LOS: 0 days    Annita Brod, MD [facility] Pager [phone]  If 7PM-7AM, please contact night-coverage [email or web address] Password [email or web address] 08/01/2016, 1:11 PM

## 2016-08-01 NOTE — Progress Notes (Signed)
Urology Inpatient Progress Report  Rectal bleeding [K62.5] Abnormal CT of the abdomen [R93.5] Right ureteral stone [N20.1]  Procedure(s): CYSTOSCOPY WITH RIGHT  RETROGRADE PYELOGRAM, URETEROSCOPY URETERAL REIMPLANT, right boari flap, right psoas hitch  1 Day Post-Op   Intv/Subj: Emergent exploratory surgery and ureteral reconstruction for avulsed ureter at time of ureterscopy. Stable overnight Complaining of abdominal pain, otherwise doing well. Denies nausea   Active Problems:   Benign hypertension   Rectal bleeding   Uncontrolled type 2 diabetes mellitus with hyperglycemia ([place])   Urolithiasis   Nephrolithiasis  Current Facility-Administered Medications  Medication Dose Route Frequency Provider Last Rate Last Dose  . 0.9 %  sodium chloride infusion   Intravenous Continuous Rolland Porter, MD 100 mL/hr at 08/01/16 1006    . acetaminophen (OFIRMEV) 10 MG/ML IV           . aspirin EC tablet 81 mg  81 mg Oral Daily [name], MD   81 mg at 08/01/16 0956  . diphenhydrAMINE (BENADRYL) injection 12.5 mg  12.5 mg Intravenous Q6H PRN [name], MD       Or  . diphenhydrAMINE (BENADRYL) 12.5 MG/5ML elixir 12.5 mg  12.5 mg Oral Q6H PRN [name], MD      . diphenhydrAMINE (BENADRYL) injection 12.5 mg  12.5 mg Intravenous Q6H PRN [name], MD       Or  . diphenhydrAMINE (BENADRYL) 12.5 MG/5ML elixir 12.5 mg  12.5 mg Oral Q6H PRN [name], MD      . heparin injection 5,000 Units  5,000 Units Subcutaneous Q8H [name], MD   5,000 Units at 08/01/16 [phone]  . hydrALAZINE (APRESOLINE) injection 10 mg  10 mg Intravenous Q4H PRN Rise Patience, MD      . HYDROmorphone (DILAUDID) 1 MG/ML injection           . HYDROmorphone (DILAUDID) 1 MG/ML injection           . HYDROmorphone (DILAUDID) 1 mg/mL PCA injection   Intravenous Q4H [name], MD   25 mg at 08/01/16 [phone]  . insulin aspart (novoLOG) injection 0-9 Units  0-9  Units Subcutaneous Q4H [name], MD   5 Units at 08/01/16 [phone]  . LORazepam (ATIVAN) tablet 0.5 mg  0.5 mg Oral QHS PRN [name], MD      . metFORMIN (GLUCOPHAGE) tablet 1,000 mg  1,000 mg Oral BID WC [name], MD   1,000 mg at 08/01/16 0801  . metoprolol tartrate (LOPRESSOR) injection 5 mg  5 mg Intravenous Q8H Rise Patience, MD   5 mg at 08/01/16 [phone]  . metoprolol tartrate (LOPRESSOR) tablet 50 mg  50 mg Oral Daily [name], MD   50 mg at 08/01/16 0955  . naloxone [facility]) injection 0.4 mg  0.4 mg Intravenous PRN [name], MD       And  . sodium chloride flush (NS) 0.9 % injection 9 mL  9 mL Intravenous PRN [name], MD      . naloxone [facility]) injection 0.4 mg  0.4 mg Intravenous PRN [name], MD       And  . sodium chloride flush (NS) 0.9 % injection 9 mL  9 mL Intravenous PRN [name], MD      . ondansetron [facility]) tablet 4 mg  4 mg Oral Q6H PRN Rise Patience, MD       Or  . ondansetron (  ZOFRAN) injection 4 mg  4 mg Intravenous Q6H PRN Rise Patience, MD   4 mg at 07/31/16 2312  . ondansetron (ZOFRAN) injection 4 mg  4 mg Intravenous Q6H PRN [name], MD      . ondansetron [facility]) injection 4 mg  4 mg Intravenous Q6H PRN [name], MD      . oxyCODONE (Oxy IR/ROXICODONE) immediate release tablet 5 mg  5 mg Oral Q4H PRN Gardiner Barefoot, NP   5 mg at 08/01/16 0801  . sertraline (ZOLOFT) tablet 50 mg  50 mg Oral Daily [name], MD   50 mg at 08/01/16 0956     Objective: Vital: Vitals:   08/01/16 0842 08/01/16 0900 08/01/16 0955 08/01/16 1000  BP:  122/69 (!) 144/95 116/64  Pulse:   89   Resp: (!) 21 18  17   Temp:      TempSrc:      SpO2: 95% 94%  94%  Weight:      Height:       I/Os: I/O last 3 completed shifts: In: 3920 [I.V.:3920] Out: 1150 [Urine:700; Drains:50; Blood:400]  Physical Exam:  General: Patient is in no apparent  distress Lungs: Normal respiratory effort, chest expands symmetrically. GI: Incision is dressed -  c/d/i. Abdomen is soft JP drain with serosanguinous drainage Foley: blood tinged urine Ext: lower extremities symmetric  Lab Results:  Recent Labs  07/31/16 0037 07/31/16 1154 08/01/16 0324  WBC 12.9* 9.1 18.2*  HGB 15.8 13.6 14.0  HCT 44.5 41.2 42.4    Recent Labs  07/31/16 0037 07/31/16 1154 08/01/16 0324  NA 136 138 137  K 3.8 3.8 4.4  CL 100* 105 104  CO2 23 25 19*  GLUCOSE 253* 148* 315*  BUN 22* 20 17  CREATININE 1.22 1.37* 1.40*  CALCIUM 9.8 8.8* 8.3*    Recent Labs  07/31/16 0129  INR 0.94   No results for input(s): LABURIN in the last 72 hours. Results for orders placed or performed during the hospital encounter of 07/31/16  Surgical PCR screen     Status: None   Collection Time: 07/31/16  1:25 PM  Result Value Ref Range Status   MRSA, PCR NEGATIVE NEGATIVE Final   Staphylococcus aureus NEGATIVE NEGATIVE Final    Comment:        The Xpert SA Assay (FDA approved for NASAL specimens in patients over 33 years of age), is one component of a comprehensive surveillance program.  Test performance has been validated by [facility] for patients greater than or equal to 48 year old. It is not intended to diagnose infection nor to guide or monitor treatment.     Studies/Results: Dg Chest 2 View  Result Date: 07/31/2016 CLINICAL DATA:  Cough.  Abnormal CT. EXAM: CHEST  2 VIEW COMPARISON:  CT 07/31/2016.  Chest x-ray 03/22/2016. FINDINGS: Mediastinum hilar structures normal. Right base atelectasis and infiltrate. Mild left base subsegmental atelectasis and or scarring. No significant pleural effusion. No pneumothorax. Cardiomegaly. No pulmonary venous congestion. No acute bony abnormality. IMPRESSION: Right base atelectasis and infiltrate. This is new from 03/22/2016. Findings correlate with CT findings of 07/31/2016. Electronically Signed   By: Marcello Moores   Register   On: 07/31/2016 07:02   Dg C-arm 1-60 Min-no Report  Result Date: 07/31/2016 Fluoroscopy was utilized by the requesting physician.  No radiographic interpretation.   Ct Renal Stone Study  Result Date: 07/31/2016 CLINICAL DATA:  Right lower quadrant pain radiating into right flank. Rectal  bleeding tonight. EXAM: CT ABDOMEN AND PELVIS WITHOUT CONTRAST TECHNIQUE: Multidetector CT imaging of the abdomen and pelvis was performed following the standard protocol without IV contrast. COMPARISON:  None. FINDINGS: Lower chest: Bronchial thickening with areas of bronchial filling in the right bronchus intermedius with atelectasis and tree in bud opacities in the right lower and right middle lobes. Heart is normal in size. Coronary artery calcifications. Hepatobiliary: Decreased hepatic density consistent with steatosis. Focal fatty sparing adjacent gallbladder fossa. No evidence of focal lesion. Gallbladder physiologically distended, no calcified stone. No biliary dilatation. Pancreas: Parenchymal atrophy. No ductal dilatation or inflammation. Spleen: Normal in size without focal abnormality. Adrenals/Urinary Tract: Moderate right hydronephrosis. There are 2 obstructing stones in the mid ureter measuring 4 and 3 mm respectively at the level of L3-L4. Aim punctate stone is in the ureter slightly more distally. Additional punctate nonobstructing stone in the lower right kidney. Mild right hydronephrosis. Cyst in the lower pole the right kidney measured approximately 4.1 cm. Probable additional cortical cyst in the upper kidney. No left hydronephrosis. No left urolithiasis. Exophytic cyst from the lower left kidney measures 5 cm. The adrenal glands are normal. Urinary bladder is physiologically distended, no definite wall thickening. Stomach/Bowel: Stomach is decompressed. No small bowel inflammation, wall thickening or obstruction. The appendix is normal. Small to moderate stool burden throughout the colon.  Vascular/Lymphatic: Mild aortic atherosclerosis without aneurysm. Few prominent porta hepatis nodes are likely reactive. Reproductive: Prostate gland is enlarged measuring 5.7 x 5.0 cm causing mass effect on the bladder base. Other: Calcified over density in the central pelvis may be calcified lymph node or sequela of prior fat necrosis. There is fat within both inguinal canals. Tiny fat containing umbilical hernia. Musculoskeletal: There are no acute or suspicious osseous abnormalities. IMPRESSION: 1. Right hydronephrosis. There are 3 stones in the right mid ureter, 2 adjacent measuring 4 and 3 mm, with a slightly more distal punctate stone. 2. Additional nonobstructing right renal calculi. 3. Hepatic steatosis. 4. Lung base evaluation demonstrates bronchial thickening of the right bronchus intermedius, lower and middle lobes. Associated atelectasis and tree in bud opacities. Findings suggest bronchial filling process, aspiration or mucoid plugging with adjacent postobstructive atelectasis and bronchiolitis. 5. Additional incidental findings include bilateral renal cysts and enlarged prostate gland. Aortic atherosclerosis and coronary artery calcifications. Electronically Signed   By: Jeb Levering M.D.   On: 07/31/2016 03:17    Assessment: Procedure(s): CYSTOSCOPY WITH RIGHT  RETROGRADE PYELOGRAM, URETEROSCOPY URETERAL REIMPLANT, right boari flap, right psoas hitch, 1 Day Post-Op  doing well.  Plan: PCA for pain along with scheduled IV tylenol, avoid toradol for now, continue to monitor rectal bleeding Clear liquid diet  Resume home meds as able, sliding scale insulin, Hydrazine PRN Aggressive bowel regimen given history of constipation/hemorrhoids/rectal bleeding PT/OT SQH, PPI, SSI Patient likely to be in the hospital for the next 4-5 days.    Louis Meckel, MD Urology 08/01/2016, 10:36 AM

## 2016-08-01 NOTE — Progress Notes (Signed)
Inpatient Diabetes Program Recommendations  AACE/ADA: New Consensus Statement on Inpatient Glycemic Control (2015)  Target Ranges:  Prepandial:   less than 140 mg/dL      Peak postprandial:   less than 180 mg/dL (1-2 hours)      Critically ill patients:  140 - 180 mg/dL   Lab Results  Component Value Date   GLUCAP 266 (H) 08/01/2016   HGBA1C 7.6 06/26/2016   HGBA1C 7.6 06/26/2016    Review of Glycemic Control  Diabetes history: DM2 Outpatient Diabetes medications: metformin 1000 mg bid Current orders for Inpatient glycemic control: Novolog 0-9 units Q4H, metformin 1000 mg bid  AG - 14. CO2 - 19 Needs basal insulin. Urine glucose - > 500  Needs tight control for healing.  Inpatient Diabetes Program Recommendations:    Add Lantus 12 units Q24H Increase Novolog to 0-15 units Q4H while CL or NPO  Will follow closely.  Thank you. Lorenda Peck, RD, LDN, CDE Inpatient Diabetes Coordinator [phone]

## 2016-08-01 NOTE — Anesthesia Postprocedure Evaluation (Addendum)
Anesthesia Post Note  Patient: LONGINO TREFZ  Procedure(s) Performed: Procedure(s) (LRB): CYSTOSCOPY WITH RIGHT  RETROGRADE PYELOGRAM, URETEROSCOPY (Right) URETERAL REIMPLANT  Patient location during evaluation: PACU Anesthesia Type: General Level of consciousness: awake and patient uncooperative Pain management: pain level controlled Vital Signs Assessment: post-procedure vital signs reviewed and stable Cardiovascular status: stable Postop Assessment: no signs of nausea or vomiting Anesthetic complications: no        Last Vitals:  Vitals:   07/31/16 1616 08/01/16 0034  BP: (!) 152/92   Pulse: 81   Resp: 18   Temp: 37 C 37.2 C    Last Pain:  Vitals:   07/31/16 1735  TempSrc:   PainSc: 7    Pain Goal: Patients Stated Pain Goal: 3 (07/31/16 1735)               Latiesha Harada JR,JOHN Mateo Flow

## 2016-08-01 NOTE — Op Note (Addendum)
Preoperative diagnosis:  1. Right ureteral stones  2. Right ureteral avulsion  Postoperative diagnosis:  1. same   Procedure: 1. Right retrograde pyelogram 2. Right ureteroscopy 3. Right boari flap, right psoas hitch  Surgeon: [name], MD Assistant: Raynelle Bring, MD  Anesthesia: General  Complications:  Distal ureteral avulsion - treated with emergent right boari flap/psoas hitch  Intraoperative findings:  #1 Right retrograde pyelogram demonstrated a filling defect within the mid to proximal ureter consistent with the patient's stones. The patient had mild hydroureteronephrosis. #2: The patient had an obstructing prostate. The prostate was very firm and made cannulating the right ureteral orifice challenging. Further, the patient had a very narrow mid ureter. When performing ureteroscopy with the rigid ureteroscope in the mid/proximal ureter in attempt to locate the stones I noted a sudden pop. At this point I had made the decision to place a stent and plan for follow-up ureteroscopy in the near future. Upon removing the scope, I was unable to get the scope out the patient's urethra.  I pushed the patient's penis into his scrotum and noted that there was ureter on the end of the scope. At this point, I quickly realized that the distal ureter had been avulsed. #3: A 5-6 cm Boari flap was created with a psoas hitch which allowed enough length to connect the proximal ureter into the bladder. #4: 24cm x 17F JJ stent in right ureter  EBL: 400  Specimens: None  Indication: [name] is a 61 y.o. patient who presented to the emergency department with right abdominal pain and some mild rectal bleeding. He was admitted to the hospital and is hemoglobin trended for bleeding. He was noted to be stable. In the emergency department however, he did note severe abdominal pain and a CT scan was obtained demonstrating 3 obstructing ureteral stones..  After reviewing the management options  for treatment, he elected to proceed with the above surgical procedure(s). We have discussed the potential benefits and risks of the procedure, side effects of the proposed treatment, the likelihood of the patient achieving the goals of the procedure, and any potential problems that might occur during the procedure or recuperation. Informed consent has been obtained.  Description of procedure:  The patient was taken to the operating room and general anesthesia was induced.  The patient was placed in the dorsal lithotomy position, prepped and draped in the usual sterile fashion, and preoperative antibiotics were administered. A preoperative time-out was performed.   A 21 French 30 cystoscope was then gently passed to the patient's urethra and into the bladder. A 360 cystoscopic evaluation was then performed and no significant outer volume noted. The prostate was noted to be obstructing with a high median bar. Ureters were orthotopic.  I then used a 5 [place] open-ended ureteral catheter and performed a retrograde pyelogram in the right collecting system with the above findings. I then advanced a 0.038 sensor wire through the open-ended catheter and advanced it up into the right renal pelvis. I then exchanged the cystoscope for a rigid ureteroscope. The ureteroscope was advanced into the patient's bladder under visual guidance. I then used a second sensor wire to cannulate the right ureteral orifice and a railroad the scope into the patient's distal ureter. Once into the ureter I was able to get the scope into the patient's mid ureter, although was tight. I continued to apply pressure to the scope to advance into the proximal ureter so that I could get access to the stones.  At this point is when I felt a pop. I did make a decision at this point that I would place a stent and come back once the ureter and been dilated. However, upon removing the ureteroscope and noted that the distal ureter had avulsed and was  around the ureteroscope.  I then spoke to the patient's sister who is listed as an emergency contact explained the circumstances. After going over the circumstances, the sister agreed to allow me to proceed with emergent ureteral reconstruction. The patient was then repositioned in the supine position, his abdomen which shaved, and he was prepped and draped in the routine sterile fashion. Dr. Alinda Money then joined the surgery as it was necessary for him to assist me to help facilitate the surgery. I then made a midline incision from several centimeters below the xiphoid process around the umbilicus down to the pubic bone. The incision was taken down to the fascia with cautery. The abdomen was entered in the lower midline and the urachus was identified. We then entered the abdomen in the upper midline and took this down around the umbilicus. We then isolated the urachus and took this down to the bladder sidewalls. At this point we placed a Bookwalter retractor and small body wall retractors to retract the incision open. The patient's intestines were then packed away to the patient's left and the retroperitoneum was opened. The colon was reflected medially and the kidney was dissected out with Rose fashion. The proximal holes ureter was identified and marked with a chromic stitch. The ureter was then dissected proximally to the renal pelvis. We then continued dissecting the retroperitoneal space down to the so as muscle and carried this down to the pelvis and into the bladder. We then freed the bladder and opened up the space of Retzius as well as the lateral pedicles of the bladder. The superior vesical arteries were not taken. This point we inserted a three-way Foley catheter and instilled approximately 300 mL of normal saline into the patient's bladder identifying the bladder margins. Realizing that with mobilizing the bladder and with a Boari flap we would be able to connect the bladder to the urethra. As such, we  made a approximately 6 cm times 2 cm Boari flap on the anterior/dome of the bladder wall. This was pexied to the so as muscle. We then tunneled the ureter through the bladder flap and secured it to the flap with 4 oh Vicryls in an interrupted fashion after it was widely spatulated.  A 24cm x 6 F JJ stent was then advanced into the right renal pelvis using a sensor wire.  The wire was removed and the distal end of the stent was pushed into the bladder.  We then closed the Boari flap with 3-0 Vicryl in a running fashion along with the cystotomy. We closed these in 2 layers with the second layer being a 2-0 Vicryl. We then filled the bladder up and noted a small leak at the junction of the Boari flap and the cystotomy which was reinforced. There was also a leak noted within the Boari flap which was reinforced. Again filling up the bladder these leaks had largely resolved. We did place a third layer of suture closing the perivesical fat as well as the Tamera Stands does down over the Boari flap.  We then unpacked the bowel and inspected all the intestines to ensure there were no skin ache or retraction injuries. We then placed a 19 [place] Blake drain in the right  lower quadrant placing the drain around the ureterovesical junction. I put in a final 18 French  Foley catheter. I closed the fascia with 0.0 PDS starting at both in the venous incision and pulling it to the middle. Experal was injected into the incision. The wound was then irrigated and the skin closed with staples.  At the end of the case all sponges, laps, needles, and instruments had been accounted for, the count was correct times 2.  The patient was extubated and returned the PACU in stable condition.  [name], M.D.

## 2016-08-01 NOTE — Transfer of Care (Signed)
Immediate Anesthesia Transfer of Care Note  Patient: [name]  Procedure(s) Performed: Procedure(s): CYSTOSCOPY WITH RIGHT  RETROGRADE PYELOGRAM, URETEROSCOPY (Right) URETERAL REIMPLANT  Patient Location: PACU  Anesthesia Type:General  Level of Consciousness:  sedated, patient cooperative and responds to stimulation  Airway & Oxygen Therapy:Patient Spontanous Breathing and Patient connected to face mask oxgen  Post-op Assessment:  Report given to PACU RN and Post -op Vital signs reviewed and stable  Post vital signs:  Reviewed and stable  Last Vitals:  Vitals:   07/31/16 1022 07/31/16 1616  BP: (!) 154/92 (!) 152/92  Pulse: 93 81  Resp: 17 18  Temp: 84.7 C 37 C    Complications: No apparent anesthesia complications

## 2016-08-02 DIAGNOSIS — I10 Essential (primary) hypertension: Secondary | ICD-10-CM

## 2016-08-02 DIAGNOSIS — N200 Calculus of kidney: Secondary | ICD-10-CM

## 2016-08-02 DIAGNOSIS — S3710XD Unspecified injury of ureter, subsequent encounter: Secondary | ICD-10-CM

## 2016-08-02 LAB — BASIC METABOLIC PANEL
Anion gap: 8 (ref 5–15)
BUN: 12 mg/dL (ref 6–20)
CHLORIDE: 106 mmol/L (ref 101–111)
CO2: 25 mmol/L (ref 22–32)
CREATININE: 0.99 mg/dL (ref 0.61–1.24)
Calcium: 7.6 mg/dL — ABNORMAL LOW (ref 8.9–10.3)
GFR calc Af Amer: >60 mL/min (ref >60)
GFR calc non Af Amer: >60 mL/min (ref >60)
GLUCOSE: 148 mg/dL — AB (ref 65–99)
POTASSIUM: 3.3 mmol/L — AB (ref 3.5–5.1)
SODIUM: 139 mmol/L (ref 135–145)

## 2016-08-02 LAB — CBC
HCT: 37.8 % — ABNORMAL LOW (ref 39.0–52.0)
HEMOGLOBIN: 11.9 g/dL — AB (ref 13.0–17.0)
MCH: 28.8 pg (ref 26.0–34.0)
MCHC: 31.5 g/dL (ref 30.0–36.0)
MCV: 91.5 fL (ref 78.0–100.0)
Platelets: 236 10*3/uL (ref 150–400)
RBC: 4.13 MIL/uL — AB (ref 4.22–5.81)
RDW: 14.1 % (ref 11.5–15.5)
WBC: 10 10*3/uL (ref 4.0–10.5)

## 2016-08-02 LAB — GLUCOSE, CAPILLARY
GLUCOSE-CAPILLARY: 209 mg/dL — AB (ref 65–99)
Glucose-Capillary: 184 mg/dL — ABNORMAL HIGH (ref 65–99)
Glucose-Capillary: 164 mg/dL — ABNORMAL HIGH (ref 65–99)
Glucose-Capillary: 125 mg/dL — ABNORMAL HIGH (ref 65–99)
Glucose-Capillary: 183 mg/dL — ABNORMAL HIGH (ref 65–99)

## 2016-08-02 MED ORDER — POTASSIUM CHLORIDE CRYS ER 20 MEQ PO TBCR
40.0000 meq | EXTENDED_RELEASE_TABLET | Freq: Once | ORAL | Status: AC
  Administered 2016-08-02: 40 meq via ORAL
  Filled 2016-08-02: qty 2

## 2016-08-02 MED ORDER — KETOROLAC TROMETHAMINE 15 MG/ML IJ SOLN
15.0000 mg | Freq: Four times a day (QID) | INTRAMUSCULAR | Status: DC | PRN
  Administered 2016-08-02: 15 mg via INTRAVENOUS
  Filled 2016-08-02: qty 1

## 2016-08-02 MED ORDER — POTASSIUM CHLORIDE 20 MEQ PO PACK: 40.0000 meq | PACK | Freq: Two times a day (BID) | ORAL | Status: DC

## 2016-08-02 MED ORDER — FUROSEMIDE 10 MG/ML IJ SOLN
20.0000 mg | Freq: Once | INTRAMUSCULAR | Status: AC
  Administered 2016-08-02: 20 mg via INTRAVENOUS
  Filled 2016-08-02: qty 2

## 2016-08-02 MED ORDER — POTASSIUM CHLORIDE 2 MEQ/ML IV SOLN: INTRAVENOUS | Status: DC

## 2016-08-02 MED ORDER — BISACODYL 10 MG RE SUPP
10.0000 mg | Freq: Once | RECTAL | Status: AC
  Administered 2016-08-02: 10 mg via RECTAL
  Filled 2016-08-02: qty 1

## 2016-08-02 MED ORDER — KCL IN DEXTROSE-NACL 20-5-0.45 MEQ/L-%-% IV SOLN
INTRAVENOUS | Status: DC
  Administered 2016-08-02 (×2): via INTRAVENOUS
  Filled 2016-08-02 (×3): qty 1000

## 2016-08-02 MED ORDER — DOCUSATE SODIUM 100 MG PO CAPS
100.0000 mg | ORAL_CAPSULE | Freq: Two times a day (BID) | ORAL | Status: DC
  Administered 2016-08-02 (×2): 100 mg via ORAL
  Filled 2016-08-02 (×2): qty 1

## 2016-08-02 MED ORDER — ACETAMINOPHEN 10 MG/ML IV SOLN
1000.0000 mg | Freq: Four times a day (QID) | INTRAVENOUS | Status: AC
  Administered 2016-08-02 – 2016-08-03 (×4): 1000 mg via INTRAVENOUS
  Filled 2016-08-02 (×4): qty 100

## 2016-08-02 MED ORDER — BISACODYL 10 MG RE SUPP: 10.0000 mg | Freq: Every day | RECTAL | Status: DC | PRN

## 2016-08-02 MED ORDER — OXYCODONE HCL 5 MG PO TABS
5.0000 mg | ORAL_TABLET | ORAL | Status: DC | PRN
  Administered 2016-08-03 (×2): 5 mg via ORAL
  Administered 2016-08-06 (×2): 10 mg via ORAL
  Administered 2016-08-07 (×4): 15 mg via ORAL
  Administered 2016-08-07: 5 mg via ORAL
  Administered 2016-08-08: 15 mg via ORAL
  Administered 2016-08-08: 10 mg via ORAL
  Filled 2016-08-02: qty 3
  Filled 2016-08-02: qty 1
  Filled 2016-08-02 (×2): qty 2
  Filled 2016-08-02: qty 1
  Filled 2016-08-02: qty 3
  Filled 2016-08-02: qty 2
  Filled 2016-08-02: qty 3
  Filled 2016-08-02: qty 2
  Filled 2016-08-02 (×2): qty 3

## 2016-08-02 NOTE — Progress Notes (Signed)
Urology Inpatient Progress Report  Rectal bleeding [K62.5] Abnormal CT of the abdomen [R93.5] Right ureteral stone [N20.1]  Procedure(s): CYSTOSCOPY WITH RIGHT  RETROGRADE PYELOGRAM, URETEROSCOPY URETERAL REIMPLANT, right boari flap, right psoas hitch  2 Days Post-Op   Intv/Subj: Transferred to the floor  Complaining of abdominal pain Tolerating clear liquids without nausea - no flatus. Has not been out of bed.   Active Problems:   Microcytic anemia   Benign hypertension   Rectal bleeding   Uncontrolled type 2 diabetes mellitus with hyperglycemia ([place])   Urolithiasis   Nephrolithiasis   Ureteral trauma, subsequent encounter  Current Facility-Administered Medications  Medication Dose Route Frequency Provider Last Rate Last Dose  . acetaminophen (OFIRMEV) IV 1,000 mg  1,000 mg Intravenous Q6H [name], MD   Stopped at 08/02/16 1125  . amLODipine (NORVASC) tablet 5 mg  5 mg Oral Daily [name], MD   5 mg at 08/02/16 1113  . aspirin EC tablet 81 mg  81 mg Oral Daily [name], MD   81 mg at 08/02/16 1114  . bisacodyl (DULCOLAX) suppository 10 mg  10 mg Rectal Daily PRN [name], MD      . dextrose 5 % and 0.45 % NaCl with KCl 20 mEq/L infusion   Intravenous Continuous [name], MD 100 mL/hr at 08/02/16 0849    . diphenhydrAMINE (BENADRYL) injection 12.5 mg  12.5 mg Intravenous Q6H PRN [name], MD       Or  . diphenhydrAMINE (BENADRYL) 12.5 MG/5ML elixir 12.5 mg  12.5 mg Oral Q6H PRN [name], MD      . diphenhydrAMINE (BENADRYL) injection 12.5 mg  12.5 mg Intravenous Q6H PRN [name], MD       Or  . diphenhydrAMINE (BENADRYL) 12.5 MG/5ML elixir 12.5 mg  12.5 mg Oral Q6H PRN [name], MD      . docusate sodium (COLACE) capsule 100 mg  100 mg Oral BID [name], MD   100 mg at 08/02/16 1113  . heparin injection 5,000 Units  5,000 Units Subcutaneous Q8H [name],  MD   5,000 Units at 08/02/16 0998  . hydrALAZINE (APRESOLINE) injection 10 mg  10 mg Intravenous Q4H PRN Rise Patience, MD      . losartan (COZAAR) tablet 100 mg  100 mg Oral Daily [name], MD   100 mg at 08/02/16 1112   And  . hydrochlorothiazide (HYDRODIURIL) tablet 25 mg  25 mg Oral Daily [name], MD   25 mg at 08/02/16 1113  . HYDROmorphone (DILAUDID) 1 mg/mL PCA injection   Intravenous Q4H [name], MD   25 mg at 08/01/16 [phone]  . insulin aspart (novoLOG) injection 0-15 Units  0-15 Units Subcutaneous Q4H Annita Brod, MD   3 Units at 08/02/16 1303  . insulin glargine (LANTUS) injection 12 Units  12 Units Subcutaneous QHS [name], MD   12 Units at 08/01/16 2157  . ketorolac (TORADOL) 15 MG/ML injection 15 mg  15 mg Intravenous Q6H PRN [name], MD   15 mg at 08/02/16 1304  . LORazepam (ATIVAN) tablet 0.5 mg  0.5 mg Oral QHS PRN [name], MD   0.5 mg at 08/01/16 1954  . metoprolol tartrate (LOPRESSOR) tablet 50 mg  50 mg Oral Daily [name], MD   50 mg at 08/02/16 1113  . naloxone [facility]) injection 0.4 mg  0.4  mg Intravenous PRN [name], MD       And  . sodium chloride flush (NS) 0.9 % injection 9 mL  9 mL Intravenous PRN [name], MD      . naloxone [facility]) injection 0.4 mg  0.4 mg Intravenous PRN [name], MD       And  . sodium chloride flush (NS) 0.9 % injection 9 mL  9 mL Intravenous PRN [name], MD      . ondansetron [facility]) tablet 4 mg  4 mg Oral Q6H PRN Rise Patience, MD       Or  . ondansetron [facility]) injection 4 mg  4 mg Intravenous Q6H PRN Rise Patience, MD   4 mg at 07/31/16 2312  . ondansetron (ZOFRAN) injection 4 mg  4 mg Intravenous Q6H PRN [name], MD      . oxyCODONE (Oxy IR/ROXICODONE) immediate release tablet 5-15 mg  5-15 mg Oral Q4H PRN [name], MD      . sertraline (ZOLOFT) tablet 50 mg  50 mg  Oral Daily [name], MD   50 mg at 08/02/16 1113     Objective: Vital: Vitals:   08/02/16 0407 08/02/16 0800 08/02/16 0945 08/02/16 1200  BP: 113/67  127/66   Pulse: 95  (!) 102   Resp: 12 12  12   Temp: 98.3 F (36.8 C)  98.3 F (36.8 C)   TempSrc: Oral  Oral   SpO2: 91% 93% (!) 86% 95%  Weight: 95.6 kg (210 lb 12.2 oz)     Height:       I/Os: I/O last 3 completed shifts: In: 4050 [I.V.:4050] Out: 2975 [Urine:2450; Drains:125; Blood:400]  Physical Exam:  General: Patient is in no apparent distress Lungs: Normal respiratory effort, chest expands symmetrically. GI: Incision is dressed -  c/d/i. Abdomen is soft JP drain with serosanguinous drainage Foley: blood tinged urine Ext: lower extremities symmetric  Lab Results:  Recent Labs  07/31/16 1154 08/01/16 0324 08/02/16 0551  WBC 9.1 18.2* 10.0  HGB 13.6 14.0 11.9*  HCT 41.2 42.4 37.8*    Recent Labs  07/31/16 1154 08/01/16 0324 08/02/16 0551  NA 138 137 139  K 3.8 4.4 3.3*  CL 105 104 106  CO2 25 19* 25  GLUCOSE 148* 315* 148*  BUN 20 17 12   CREATININE 1.37* 1.40* 0.99  CALCIUM 8.8* 8.3* 7.6*    Recent Labs  07/31/16 0129  INR 0.94   No results for input(s): LABURIN in the last 72 hours. Results for orders placed or performed during the hospital encounter of 07/31/16  Surgical PCR screen     Status: None   Collection Time: 07/31/16  1:25 PM  Result Value Ref Range Status   MRSA, PCR NEGATIVE NEGATIVE Final   Staphylococcus aureus NEGATIVE NEGATIVE Final    Comment:        The Xpert SA Assay (FDA approved for NASAL specimens in patients over 71 years of age), is one component of a comprehensive surveillance program.  Test performance has been validated by [facility] for patients greater than or equal to 80 year old. It is not intended to diagnose infection nor to guide or monitor treatment.     Studies/Results: Dg C-arm 1-60 Min-no Report  Result Date:  07/31/2016 Fluoroscopy was utilized by the requesting physician.  No radiographic interpretation.    Assessment: Procedure(s): CYSTOSCOPY WITH RIGHT  RETROGRADE PYELOGRAM, URETEROSCOPY URETERAL REIMPLANT, right boari flap,  right psoas hitch, 2 Days Post-Op  doing well.  Plan: Urine was slightly more bloody this AM - gave him 20mg  IV lasix to flush out bladder/catheter. Restarted IV APAP and added Toradol for better pain control. Needs to be out of bed to chair and working with PT. Advanced diet to regular diet as tolerated. Switched to MIVF and repleted K. Started patient on aggressive bowel regimen given his history of severe constipation. Patient on most HTN/DM home medications as well as PRN hyrdralazine and SSI. SQH, ASA and PPI  Will be here over the weekend most likely.  Hopefully he should return to [facility].    Louis Meckel, MD Urology 08/02/2016, 1:11 PM

## 2016-08-02 NOTE — Progress Notes (Signed)
PROGRESS NOTE    [name]  XHB:[id number] DOB: [birth year] DOA: 07/31/2016 PCP: [name]     Brief Narrative:  61 y/o man admitted to the hospital on 5/16 from home due to rectal bleeding. CT scan showed right-sided hydronephrosis and 3 ureter stones. Seen by GU who performed a ureteroscopy with initial plans for stent placement, however ended up requiring emergent exploratory surgery and ureteral reconstruction for avulsed ureter at time of ureteroscopy. Is on PCA pump for pain management.   Assessment & Plan:   Active Problems:   Microcytic anemia   Benign hypertension   Rectal bleeding   Uncontrolled type 2 diabetes mellitus with hyperglycemia ([facility])   Urolithiasis   Nephrolithiasis   Ureteral trauma, subsequent encounter   Rectal Bleeding -No further bleeding since admission. -Hb has been stable.  Nephrolithiasis with Right Sided Hydronephrosis -With ureter trauma and avulsion at time of ureteroscopy s/p repair. -Appreciate urology assistance. -Remains on PCA for pain management.  HTN -BP low normal. -Continue BB, ARB, HCTZ.  DM II -Fair control on current regimen. -Continue to monitor and adjust regimen as needed.   DVT prophylaxis: SQ heparin Code Status: full code Family Communication: patient only Disposition Plan: as per urology; no active medical issues.  Consultants:   Urology, Dr. Louis Meckel  Procedures:   As above  Antimicrobials:  Anti-infectives    Start     Dose/Rate Route Frequency Ordered Stop   07/31/16 2153  dextrose 5 % with cefTRIAXone (ROCEPHIN) ADS Med  Status:  Discontinued    Comments:  Cochran, Ron   : cabinet override      07/31/16 2153 08/01/16 0050   07/31/16 2130  cefTRIAXone (ROCEPHIN) 1 g in dextrose 5 % 50 mL IVPB     1 g 120 mL/hr over 30 Minutes Intravenous  Once 07/31/16 2120 07/31/16 2211   07/31/16 1937  ceFAZolin (ANCEF) 2-4 GM/100ML-% IVPB    Comments:  Delena Bali   : cabinet override      07/31/16  1937 08/01/16 0744       Subjective: Still with some abdominal pain  Objective: Vitals:   08/02/16 0800 08/02/16 0945 08/02/16 1200 08/02/16 1359  BP:  127/66  (!) 97/59  Pulse:  (!) 102  63  Resp: 12  12   Temp:  98.3 F (36.8 C)  97.5 F (36.4 C)  TempSrc:  Oral  Oral  SpO2: 93% (!) 86% 95% 96%  Weight:      Height:        Intake/Output Summary (Last 24 hours) at 08/02/16 1454 Last data filed at 08/02/16 0800  Gross per 24 hour  Intake             1650 ml  Output             1215 ml  Net              435 ml   Filed Weights   07/31/16 0036 08/02/16 0407  Weight: 86.2 kg (190 lb) 95.6 kg (210 lb 12.2 oz)    Examination:  General exam: Alert, awake, oriented x 3 Respiratory system: Clear to auscultation. Respiratory effort normal. Cardiovascular system:RRR. No murmurs, rubs, gallops. Gastrointestinal system: Abdomen is distended, soft and nontender. No organomegaly or masses felt. Normal bowel sounds heard. Central nervous system: Alert and oriented. No focal neurological deficits. Extremities: No C/C/E, +pedal pulses Skin: No rashes, lesions or ulcers Psychiatry: Judgement and insight appear normal. Mood & affect appropriate.  Data Reviewed: I have personally reviewed following labs and imaging studies  CBC:  Recent Labs Lab 07/31/16 0037 07/31/16 1154 08/01/16 0324 08/02/16 0551  WBC 12.9* 9.1 18.2* 10.0  HGB 15.8 13.6 14.0 11.9*  HCT 44.5 41.2 42.4 37.8*  MCV 85.2 86.4 87.8 91.5  PLT 288 249 251 010   Basic Metabolic Panel:  Recent Labs Lab 07/31/16 0037 07/31/16 1154 08/01/16 0324 08/02/16 0551  NA 136 138 137 139  K 3.8 3.8 4.4 3.3*  CL 100* 105 104 106  CO2 23 25 19* 25  GLUCOSE 253* 148* 315* 148*  BUN 22* 20 17 12   CREATININE 1.22 1.37* 1.40* 0.99  CALCIUM 9.8 8.8* 8.3* 7.6*   GFR: Estimated Creatinine Clearance: 83.7 mL/min (by C-G formula based on SCr of 0.99 mg/dL). Liver Function Tests:  Recent Labs Lab 07/31/16 0037   AST 34  ALT 43  ALKPHOS 95  BILITOT 0.6  PROT 8.1  ALBUMIN 4.2   No results for input(s): LIPASE, AMYLASE in the last 168 hours. No results for input(s): AMMONIA in the last 168 hours. Coagulation Profile:  Recent Labs Lab 07/31/16 0129  INR 0.94   Cardiac Enzymes: No results for input(s): CKTOTAL, CKMB, CKMBINDEX, TROPONINI in the last 168 hours. BNP (last 3 results) No results for input(s): PROBNP in the last 8760 hours. HbA1C: No results for input(s): HGBA1C in the last 72 hours. CBG:  Recent Labs Lab 08/01/16 2002 08/01/16 2357 08/02/16 0405 08/02/16 0745 08/02/16 1145  GLUCAP 177* 176* 164* 125* 183*   Lipid Profile: No results for input(s): CHOL, HDL, LDLCALC, TRIG, CHOLHDL, LDLDIRECT in the last 72 hours. Thyroid Function Tests: No results for input(s): TSH, T4TOTAL, FREET4, T3FREE, THYROIDAB in the last 72 hours. Anemia Panel: No results for input(s): VITAMINB12, FOLATE, FERRITIN, TIBC, IRON, RETICCTPCT in the last 72 hours. Urine analysis:    Component Value Date/Time   COLORURINE YELLOW 07/31/2016 0625   APPEARANCEUR CLEAR 07/31/2016 0625   LABSPEC 1.016 07/31/2016 0625   PHURINE 6.0 07/31/2016 0625   GLUCOSEU >=500 (A) 07/31/2016 0625   HGBUR NEGATIVE 07/31/2016 0625   BILIRUBINUR NEGATIVE 07/31/2016 0625   KETONESUR NEGATIVE 07/31/2016 0625   PROTEINUR NEGATIVE 07/31/2016 0625   UROBILINOGEN 0.2 04/18/2008 1153   NITRITE NEGATIVE 07/31/2016 0625   LEUKOCYTESUR NEGATIVE 07/31/2016 0625   Sepsis Labs: @LABRCNTIP (procalcitonin:4,lacticidven:4)  ) Recent Results (from the past 240 hour(s))  Surgical PCR screen     Status: None   Collection Time: 07/31/16  1:25 PM  Result Value Ref Range Status   MRSA, PCR NEGATIVE NEGATIVE Final   Staphylococcus aureus NEGATIVE NEGATIVE Final    Comment:        The Xpert SA Assay (FDA approved for NASAL specimens in patients over 2 years of age), is one component of a comprehensive  surveillance program.  Test performance has been validated by [facility] for patients greater than or equal to 5 year old. It is not intended to diagnose infection nor to guide or monitor treatment.          Radiology Studies: Dg C-arm 1-60 Min-no Report  Result Date: 07/31/2016 Fluoroscopy was utilized by the requesting physician.  No radiographic interpretation.        Scheduled Meds: . amLODipine  5 mg Oral Daily  . aspirin EC  81 mg Oral Daily  . docusate sodium  100 mg Oral BID  . heparin  5,000 Units Subcutaneous Q8H  . losartan  100 mg Oral Daily  And  . hydrochlorothiazide  25 mg Oral Daily  . HYDROmorphone   Intravenous Q4H  . insulin aspart  0-15 Units Subcutaneous Q4H  . insulin glargine  12 Units Subcutaneous QHS  . metoprolol tartrate  50 mg Oral Daily  . sertraline  50 mg Oral Daily   Continuous Infusions: . acetaminophen Stopped (08/02/16 1125)  . dextrose 5 % and 0.45 % NaCl with KCl 20 mEq/L 100 mL/hr at 08/02/16 0849     LOS: 1 day    Time spent: 25 minutes. Greater than 50% of this time was spent in direct contact with the patient coordinating care.     Lelon Frohlich, MD [facility] Pager [phone]  If 7PM-7AM, please contact night-coverage [email or web address] Password [email or web address] 08/02/2016, 2:54 PM

## 2016-08-02 NOTE — Evaluation (Signed)
Physical Therapy Evaluation Patient Details Name: [name] MRN: [id number] DOB: Aug 11, 1955 Today's Date: 08/02/2016   History of Present Illness  61 yo male admitted with rectal bleeding. S/P R pyelogram, ureteroscopy, ureteral reconstruction (boari flap, psoas hitch) 08/01/16. Hx of R tib/fib fx 03/2016, HTN, DM, mac deg, prostate cancer.     Clinical Impression  On eval, pt required Mod assist for mobility. Mod encouragement for participation on today. He was able to pivot from bed to recliner. Pt declined ambulation on today due to weakness and pain. He refused to wear CAM boot on R LE for transfer to recliner (despite max encouragement and explanation that he needs to adhere to ortho MD's recommendations). He did agree to wear if for ambulation once he progresses with mobility. Recommend return to SNF for continued PT at discharge. Will follow.     Follow Up Recommendations SNF    Equipment Recommendations  None recommended by PT    Recommendations for Other Services       Precautions / Restrictions Precautions Precautions: Fall Precaution Comments: R jp drain; abdominal incision Required Braces or Orthoses: Other Brace/Splint Other Brace/Splint: R CAM boot-per Dr Sharol Given progress note 07/25/16-can slowly increase WB as tolerated with boot and RW      Mobility  Bed Mobility Overal bed mobility: Needs Assistance Bed Mobility: Supine to Sit     Supine to sit: HOB elevated;Mod assist     General bed mobility comments: Assist for trunk and LEs. Mod multimodal cues for technique and encouragement. Utilized bedpad to aid with scooting, positioning at EOB. Increased time.   Transfers Overall transfer level: Needs assistance   Transfers: Squat Pivot Transfers;Sit to/from Stand Sit to Stand: Min assist;From elevated surface   Squat pivot transfers: Min assist     General transfer comment: Stood x 1 with RW for ~5 seconds before pt sat back down. Encouragement required for pt  to proceed with transfer. He declined to stand fully and preferred to partially stand and hold on to recliner armrests to pivot.   Ambulation/Gait             General Gait Details: NT-pt unwilling to attempt on today due to pain  Stairs            Wheelchair Mobility    Modified Rankin (Stroke Patients Only)       Balance Overall balance assessment: Needs assistance;History of Falls           Standing balance-Leahy Scale: Poor                               Pertinent Vitals/Pain Pain Assessment: Faces Faces Pain Scale: Hurts whole lot Pain Location: abdomen Pain Descriptors / Indicators: Operative site guarding;Grimacing;Tender;Discomfort Pain Intervention(s): PCA encouraged    Home Living Family/patient expects to be discharged to:: Skilled nursing facility                      Prior Function Level of Independence: Needs assistance   Gait / Transfers Assistance Needed: ambulatory with CAM boot and RW           Hand Dominance        Extremity/Trunk Assessment   Upper Extremity Assessment Upper Extremity Assessment: Generalized weakness    Lower Extremity Assessment Lower Extremity Assessment: Generalized weakness (note R LE external rotation)    Cervical / Trunk Assessment Cervical / Trunk Assessment: Normal  Communication  Communication: No difficulties  Cognition Arousal/Alertness: Awake/alert Behavior During Therapy: WFL for tasks assessed/performed Overall Cognitive Status: No family/caregiver present to determine baseline cognitive functioning                                 General Comments: appears somewhat confused      General Comments      Exercises     Assessment/Plan    PT Assessment Patient needs continued PT services  PT Problem List Decreased strength;Decreased mobility;Decreased activity tolerance;Decreased balance;Decreased knowledge of use of DME;Pain       PT Treatment  Interventions DME instruction;Gait training;Therapeutic activities;Therapeutic exercise;Patient/family education;Balance training;Functional mobility training    PT Goals (Current goals can be found in the Care Plan section)  Acute Rehab PT Goals Patient Stated Goal: less pain PT Goal Formulation: With patient Time For Goal Achievement: 08/16/16 Potential to Achieve Goals: Good    Frequency Min 3X/week   Barriers to discharge        Co-evaluation               AM-PAC PT "6 Clicks" Daily Activity  Outcome Measure Difficulty turning over in bed (including adjusting bedclothes, sheets and blankets)?: A Little Difficulty moving from lying on back to sitting on the side of the bed? : A Little Difficulty sitting down on and standing up from a chair with arms (e.g., wheelchair, bedside commode, etc,.)?: A Little Help needed moving to and from a bed to chair (including a wheelchair)?: A Little Help needed walking in hospital room?: A Lot Help needed climbing 3-5 steps with a railing? : A Lot 6 Click Score: 16    End of Session Equipment Utilized During Treatment: Oxygen (pt refused to wear CAM boot despite Max encouragement from therapist) Activity Tolerance: Patient limited by fatigue;Patient limited by pain Patient left: in chair;with call bell/phone within reach;with chair alarm set   PT Visit Diagnosis: Muscle weakness (generalized) (M62.81);Difficulty in walking, not elsewhere classified (R26.2)    Time: 9937-1696 PT Time Calculation (min) (ACUTE ONLY): 43 min   Charges:   PT Evaluation $PT Eval Low Complexity: 1 Procedure PT Treatments $Therapeutic Activity: 8-22 mins   PT G Codes:          Weston Anna, MPT Pager: [phone]

## 2016-08-02 NOTE — Progress Notes (Signed)
2 MG of PCA IV dilaudid wasted with Tye Savoy, RN.  Pt urine bright red, MD aware.  Will continue to monitor closely.

## 2016-08-03 LAB — BASIC METABOLIC PANEL
Anion gap: 6 (ref 5–15)
BUN: 13 mg/dL (ref 6–20)
CO2: 30 mmol/L (ref 22–32)
Calcium: 8.1 mg/dL — ABNORMAL LOW (ref 8.9–10.3)
Chloride: 100 mmol/L — ABNORMAL LOW (ref 101–111)
Creatinine, Ser: 1.24 mg/dL (ref 0.61–1.24)
Glucose, Bld: 165 mg/dL — ABNORMAL HIGH (ref 65–99)
POTASSIUM: 4.4 mmol/L (ref 3.5–5.1)
SODIUM: 136 mmol/L (ref 135–145)

## 2016-08-03 LAB — GLUCOSE, CAPILLARY
GLUCOSE-CAPILLARY: 141 mg/dL — AB (ref 65–99)
GLUCOSE-CAPILLARY: 149 mg/dL — AB (ref 65–99)
GLUCOSE-CAPILLARY: 149 mg/dL — AB (ref 65–99)
Glucose-Capillary: 132 mg/dL — ABNORMAL HIGH (ref 65–99)
Glucose-Capillary: 170 mg/dL — ABNORMAL HIGH (ref 65–99)
Glucose-Capillary: 208 mg/dL — ABNORMAL HIGH (ref 65–99)

## 2016-08-03 LAB — CBC
HCT: 37.2 % — ABNORMAL LOW (ref 39.0–52.0)
Hemoglobin: 11.5 g/dL — ABNORMAL LOW (ref 13.0–17.0)
MCH: 28.5 pg (ref 26.0–34.0)
MCHC: 30.9 g/dL (ref 30.0–36.0)
MCV: 92.3 fL (ref 78.0–100.0)
PLATELETS: 232 10*3/uL (ref 150–400)
RBC: 4.03 MIL/uL — AB (ref 4.22–5.81)
RDW: 13.7 % (ref 11.5–15.5)
WBC: 10.1 10*3/uL (ref 4.0–10.5)

## 2016-08-03 MED ORDER — ACETAMINOPHEN 500 MG PO TABS
1000.0000 mg | ORAL_TABLET | Freq: Three times a day (TID) | ORAL | Status: DC
  Administered 2016-08-03 – 2016-08-08 (×15): 1000 mg via ORAL
  Filled 2016-08-03 (×15): qty 2

## 2016-08-03 MED ORDER — SODIUM CHLORIDE 0.9 % IV SOLN
INTRAVENOUS | Status: DC
  Administered 2016-08-03 – 2016-08-04 (×2): via INTRAVENOUS

## 2016-08-03 MED ORDER — SENNOSIDES-DOCUSATE SODIUM 8.6-50 MG PO TABS
1.0000 | ORAL_TABLET | Freq: Two times a day (BID) | ORAL | Status: DC
  Administered 2016-08-03 – 2016-08-08 (×11): 1 via ORAL
  Filled 2016-08-03 (×11): qty 1

## 2016-08-03 NOTE — Progress Notes (Signed)
PROGRESS NOTE    [name]  SNK:[id number] DOB: December 07, 1955 DOA: 07/31/2016 PCP: [name]     Brief Narrative:  61 y/o man admitted to the hospital on 5/16 from home due to rectal bleeding. CT scan showed right-sided hydronephrosis and 3 ureter stones. Seen by GU who performed a ureteroscopy with initial plans for stent placement, however ended up requiring emergent exploratory surgery and ureteral reconstruction for avulsed ureter at time of ureteroscopy. Is on PCA pump for pain management. Still in significant pain.   Assessment & Plan:   Active Problems:   Microcytic anemia   Benign hypertension   Rectal bleeding   Uncontrolled type 2 diabetes mellitus with hyperglycemia (HCC)   Urolithiasis   Nephrolithiasis   Ureteral trauma, subsequent encounter   Rectal Bleeding -No further bleeding since admission. -Hb has been stable.  Nephrolithiasis with Right Sided Hydronephrosis -With ureter trauma and avulsion at time of ureteroscopy s/p repair. -Appreciate urology assistance. -Remains on PCA for pain management. Still with significant pain. Will try and transition off PCA pump over next 1-2 days.  HTN -BP low normal. -Continue BB, ARB, HCTZ.  DM II -Fair control on current regimen. -Continue to monitor and adjust regimen as needed.   DVT prophylaxis: SQ heparin Code Status: full code Family Communication: patient only Disposition Plan: as per urology; no active medical issues.  Consultants:   Urology  Procedures:   As above  Antimicrobials:  Anti-infectives    Start     Dose/Rate Route Frequency Ordered Stop   07/31/16 2153  dextrose 5 % with cefTRIAXone (ROCEPHIN) ADS Med  Status:  Discontinued    Comments:  Cochran, Ron   : cabinet override      07/31/16 2153 08/01/16 0050   07/31/16 2130  cefTRIAXone (ROCEPHIN) 1 g in dextrose 5 % 50 mL IVPB     1 g 120 mL/hr over 30 Minutes Intravenous  Once 07/31/16 2120 07/31/16 2211   07/31/16 1937   ceFAZolin (ANCEF) 2-4 GM/100ML-% IVPB    Comments:  Delena Bali   : cabinet override      07/31/16 1937 08/01/16 0744       Subjective: Sitting in chair, irritated, with significant abdominal pain  Objective: Vitals:   08/03/16 0444 08/03/16 0445 08/03/16 0821 08/03/16 1200  BP:  114/66    Pulse:  68    Resp:  12 12 13   Temp:  98.5 F (36.9 C)    TempSrc:  Oral    SpO2:  98% 95% 95%  Weight: 96.8 kg (213 lb 6.5 oz)     Height:        Intake/Output Summary (Last 24 hours) at 08/03/16 1351 Last data filed at 08/03/16 1120  Gross per 24 hour  Intake          2858.34 ml  Output             1500 ml  Net          1358.34 ml   Filed Weights   07/31/16 0036 08/02/16 0407 08/03/16 0444  Weight: 86.2 kg (190 lb) 95.6 kg (210 lb 12.2 oz) 96.8 kg (213 lb 6.5 oz)    Examination:  General exam: Alert, awake, oriented x 3 Respiratory system: Clear to auscultation. Respiratory effort normal. Cardiovascular system:RRR. No murmurs, rubs, gallops. Gastrointestinal system: Abdomen is distended, soft and tender to palpation. No organomegaly or masses felt. Normal bowel sounds heard. Central nervous system: Alert and oriented. No focal neurological deficits. Extremities:  No C/C/E, +pedal pulses Skin: No rashes, lesions or ulcers Psychiatry: Judgement and insight appear normal. Mood & affect appropriate.       Data Reviewed: I have personally reviewed following labs and imaging studies  CBC:  Recent Labs Lab 07/31/16 0037 07/31/16 1154 08/01/16 0324 08/02/16 0551 08/03/16 0536  WBC 12.9* 9.1 18.2* 10.0 10.1  HGB 15.8 13.6 14.0 11.9* 11.5*  HCT 44.5 41.2 42.4 37.8* 37.2*  MCV 85.2 86.4 87.8 91.5 92.3  PLT 288 249 251 236 628   Basic Metabolic Panel:  Recent Labs Lab 07/31/16 0037 07/31/16 1154 08/01/16 0324 08/02/16 0551 08/03/16 0536  NA 136 138 137 139 136  K 3.8 3.8 4.4 3.3* 4.4  CL 100* 105 104 106 100*  CO2 23 25 19* 25 30  GLUCOSE 253* 148* 315*  148* 165*  BUN 22* 20 17 12 13   CREATININE 1.22 1.37* 1.40* 0.99 1.24  CALCIUM 9.8 8.8* 8.3* 7.6* 8.1*   GFR: Estimated Creatinine Clearance: 67.3 mL/min (by C-G formula based on SCr of 1.24 mg/dL). Liver Function Tests:  Recent Labs Lab 07/31/16 0037  AST 34  ALT 43  ALKPHOS 95  BILITOT 0.6  PROT 8.1  ALBUMIN 4.2   No results for input(s): LIPASE, AMYLASE in the last 168 hours. No results for input(s): AMMONIA in the last 168 hours. Coagulation Profile:  Recent Labs Lab 07/31/16 0129  INR 0.94   Cardiac Enzymes: No results for input(s): CKTOTAL, CKMB, CKMBINDEX, TROPONINI in the last 168 hours. BNP (last 3 results) No results for input(s): PROBNP in the last 8760 hours. HbA1C: No results for input(s): HGBA1C in the last 72 hours. CBG:  Recent Labs Lab 08/02/16 1758 08/03/16 0105 08/03/16 0500 08/03/16 0735 08/03/16 1157  GLUCAP 209* 141* 149* 132* 149*   Lipid Profile: No results for input(s): CHOL, HDL, LDLCALC, TRIG, CHOLHDL, LDLDIRECT in the last 72 hours. Thyroid Function Tests: No results for input(s): TSH, T4TOTAL, FREET4, T3FREE, THYROIDAB in the last 72 hours. Anemia Panel: No results for input(s): VITAMINB12, FOLATE, FERRITIN, TIBC, IRON, RETICCTPCT in the last 72 hours. Urine analysis:    Component Value Date/Time   COLORURINE YELLOW 07/31/2016 0625   APPEARANCEUR CLEAR 07/31/2016 0625   LABSPEC 1.016 07/31/2016 0625   PHURINE 6.0 07/31/2016 0625   GLUCOSEU >=500 (A) 07/31/2016 0625   HGBUR NEGATIVE 07/31/2016 0625   BILIRUBINUR NEGATIVE 07/31/2016 0625   KETONESUR NEGATIVE 07/31/2016 0625   PROTEINUR NEGATIVE 07/31/2016 0625   UROBILINOGEN 0.2 04/18/2008 1153   NITRITE NEGATIVE 07/31/2016 0625   LEUKOCYTESUR NEGATIVE 07/31/2016 0625   Sepsis Labs: @LABRCNTIP (procalcitonin:4,lacticidven:4)  ) Recent Results (from the past 240 hour(s))  Surgical PCR screen     Status: None   Collection Time: 07/31/16  1:25 PM  Result Value Ref Range  Status   MRSA, PCR NEGATIVE NEGATIVE Final   Staphylococcus aureus NEGATIVE NEGATIVE Final    Comment:        The Xpert SA Assay (FDA approved for NASAL specimens in patients over 72 years of age), is one component of a comprehensive surveillance program.  Test performance has been validated by [facility] for patients greater than or equal to 15 year old. It is not intended to diagnose infection nor to guide or monitor treatment.          Radiology Studies: No results found.      Scheduled Meds: . acetaminophen  1,000 mg Oral Q8H  . amLODipine  5 mg Oral Daily  . aspirin EC  81 mg Oral Daily  . heparin  5,000 Units Subcutaneous Q8H  . losartan  100 mg Oral Daily   And  . hydrochlorothiazide  25 mg Oral Daily  . HYDROmorphone   Intravenous Q4H  . insulin aspart  0-15 Units Subcutaneous Q4H  . insulin glargine  12 Units Subcutaneous QHS  . metoprolol tartrate  50 mg Oral Daily  . senna-docusate  1 tablet Oral BID  . sertraline  50 mg Oral Daily   Continuous Infusions: . sodium chloride 50 mL/hr at 08/03/16 0818     LOS: 2 days    Time spent: 25 minutes. Greater than 50% of this time was spent in direct contact with the patient coordinating care.     Lelon Frohlich, MD [facility] Pager [phone]  If 7PM-7AM, please contact night-coverage [email or web address] Password [email or web address] 08/03/2016, 1:51 PM

## 2016-08-03 NOTE — Progress Notes (Signed)
3 Days Post-Op   Subjective/Chief Complaint:  1 - Right Ureteral Stone / Ureteral Injury - s/p RIGHT ureteroscopy then ureteral reconstruction with bladder flap 08/01/16 for obstructing ureteral stone.   2 - Pain Control - PCA initially post-op with scheduled thylenl / ketorolac.   3 - Post-op Ileus - initially npo, then advanced to clears. Carb modified diet 5/19 as tolerating clears and some flatus.   4 - Disposition / Rehab - pt independent at baseline. Has been slow to ambulate post-op. PT eval ordered 5/19.   Today "[name]" is stable. JP output remins low. Still some abd distension and pain. Has only been out of bed once since surgery 2 days ago.    Objective: Vital signs in last 24 hours: Temp:  [97.5 F (36.4 C)-98.5 F (36.9 C)] 98.5 F (36.9 C) (05/19 0445) Pulse Rate:  [60-102] 68 (05/19 0445) Resp:  [11-18] 12 (05/19 0445) BP: (97-132)/(57-70) 114/66 (05/19 0445) SpO2:  [86 %-98 %] 98 % (05/19 0445) Weight:  [96.8 kg (213 lb 6.5 oz)] 96.8 kg (213 lb 6.5 oz) (05/19 0444) Last BM Date: 08/01/16  Intake/Output from previous day: 05/18 0701 - 05/19 0700 In: 3178.3 [P.O.:660; I.V.:2118.3; IV Piggyback:400] Out: 1410 [LKGMW:1027; Drains:40] Intake/Output this shift: No intake/output data recorded.  General appearance: alert, cooperative and appears stated age Eyes: negative Nose: Nares normal. Septum midline. Mucosa normal. No drainage or sinus tenderness. Throat: lips, mucosa, and tongue normal; teeth and gums normal Neck: supple, symmetrical, trachea midline Back: symmetric, no curvature. ROM normal. No CVA tenderness. Resp: non-labored on minimal [place] O2 Cardio: Nl rate GI: moderate truncal obesity. NO rebound / guarding.  Male genitalia: normal, foley in place wtih thin tea-colored urine.  Extremities: extremities normal, atraumatic, no cyanosis or edema Pulses: 2+ and symmetric Skin: Skin color, texture, turgor normal. No rashes or lesions Lymph nodes: Cervical,  supraclavicular, and axillary nodes normal. Incision/Wound: midline incision c/d/i with staples. JP with minimal serosanguinous output.   Lab Results:   Recent Labs  08/02/16 0551 08/03/16 0536  WBC 10.0 10.1  HGB 11.9* 11.5*  HCT 37.8* 37.2*  PLT 236 232   BMET  Recent Labs  08/01/16 0324 08/02/16 0551  NA 137 139  K 4.4 3.3*  CL 104 106  CO2 19* 25  GLUCOSE 315* 148*  BUN 17 12  CREATININE 1.40* 0.99  CALCIUM 8.3* 7.6*   PT/INR No results for input(s): LABPROT, INR in the last 72 hours. ABG No results for input(s): PHART, HCO3 in the last 72 hours.  Invalid input(s): PCO2, PO2  Studies/Results: No results found.  Anti-infectives: Anti-infectives    Start     Dose/Rate Route Frequency Ordered Stop   07/31/16 2153  dextrose 5 % with cefTRIAXone (ROCEPHIN) ADS Med  Status:  Discontinued    Comments:  Cochran, Ron   : cabinet override      07/31/16 2153 08/01/16 0050   07/31/16 2130  cefTRIAXone (ROCEPHIN) 1 g in dextrose 5 % 50 mL IVPB     1 g 120 mL/hr over 30 Minutes Intravenous  Once 07/31/16 2120 07/31/16 2211   07/31/16 1937  ceFAZolin (ANCEF) 2-4 GM/100ML-% IVPB    Comments:  Delena Bali   : cabinet override      07/31/16 1937 08/01/16 0744      Assessment/Plan:  1 - Right Ureteral Stone / Ureteral Injury - doing well POD 2. Remain in house with current tubes / drains.   2 - Pain Control - continue PCA  for now, add scheduedl tylenol to reduce opiod req.   3 - Post-op Ileus - adv to carb modified.   4 - Disposition / Rehab - PT eval today as he may even require HHPT or rehab stay given current functional capacity.   [facility], Sandar Krinke 08/03/2016

## 2016-08-04 LAB — GLUCOSE, CAPILLARY
GLUCOSE-CAPILLARY: 121 mg/dL — AB (ref 65–99)
GLUCOSE-CAPILLARY: 143 mg/dL — AB (ref 65–99)
GLUCOSE-CAPILLARY: 203 mg/dL — AB (ref 65–99)
GLUCOSE-CAPILLARY: 86 mg/dL (ref 65–99)
Glucose-Capillary: 103 mg/dL — ABNORMAL HIGH (ref 65–99)
Glucose-Capillary: 176 mg/dL — ABNORMAL HIGH (ref 65–99)

## 2016-08-04 MED ORDER — MORPHINE SULFATE ER 30 MG PO TBCR
75.0000 mg | EXTENDED_RELEASE_TABLET | Freq: Two times a day (BID) | ORAL | Status: DC
  Administered 2016-08-04 – 2016-08-05 (×4): 75 mg via ORAL
  Filled 2016-08-04 (×4): qty 1

## 2016-08-04 MED ORDER — HYDROMORPHONE HCL 1 MG/ML IJ SOLN
0.5000 mg | INTRAMUSCULAR | Status: DC | PRN
Start: 2016-08-04 — End: 2016-08-04
  Administered 2016-08-04: 0.5 mg via INTRAVENOUS
  Filled 2016-08-04: qty 0.5

## 2016-08-04 MED ORDER — BISACODYL 10 MG RE SUPP
10.0000 mg | Freq: Once | RECTAL | Status: AC
  Administered 2016-08-04: 10 mg via RECTAL
  Filled 2016-08-04: qty 1

## 2016-08-04 MED ORDER — MORPHINE SULFATE 15 MG PO TABS: 15.0000 mg | ORAL_TABLET | ORAL | Status: DC | PRN

## 2016-08-04 MED ORDER — POLYETHYLENE GLYCOL 3350 17 G PO PACK
17.0000 g | PACK | Freq: Every day | ORAL | Status: DC
  Administered 2016-08-04 – 2016-08-05 (×2): 17 g via ORAL
  Filled 2016-08-04 (×2): qty 1

## 2016-08-04 NOTE — Progress Notes (Signed)
Wasted 83ml of dilaudid for PCA discontinuation. Witnessed by Gar Ponto, RN.  Barbee Shropshire. Brigitte Pulse, RN

## 2016-08-04 NOTE — Progress Notes (Signed)
4 Days Post-Op   Subjective/Chief Complaint:  1 - Right Ureteral Stone / Ureteral Injury - s/p RIGHT ureteroscopy then ureteral reconstruction with bladder flap 08/01/16 for obstructing ureteral stone.   2 - Pain Control - PCA initially post-op with scheduled thylenl / ketorolac. PCA DC'd 5/20.   3 - Post-op Ileus - initially npo, then advanced to clears. Carb modified diet 5/19 as tolerating clears and some flatus.   4 - Disposition / Rehab - pt independent at baseline. Has been slow to ambulate post-op. PT eval ordered 5/19.   Today "[name]" is stable. JP output remins low. Tollerating carb mod diet w/o emesis. Feel like he needs some help to have BM.    Objective: Vital signs in last 24 hours: Temp:  [97.2 F (36.2 C)-98 F (36.7 C)] 97.8 F (36.6 C) (05/20 0427) Pulse Rate:  [60-70] 60 (05/20 0427) Resp:  [10-18] 18 (05/20 0428) BP: (112-130)/(63-80) 112/64 (05/20 0427) SpO2:  [94 %-98 %] 98 % (05/20 0428) Weight:  [97.1 kg (214 lb 1.1 oz)] 97.1 kg (214 lb 1.1 oz) (05/20 0433) Last BM Date: 08/01/16  Intake/Output from previous day: 05/19 0701 - 05/20 0700 In: 805 [P.O.:120; I.V.:685] Out: 2536 [UYQIH:4742; Drains:70] Intake/Output this shift: No intake/output data recorded.  General appearance: alert, cooperative and appears stated age Eyes: negative Nose: Nares normal. Septum midline. Mucosa normal. No drainage or sinus tenderness. Throat: lips, mucosa, and tongue normal; teeth and gums normal Neck: supple, symmetrical, trachea midline Back: symmetric, no curvature. ROM normal. No CVA tenderness. Resp: non-labored on minimal [place] O2 Cardio: Nl rate GI: soft, non-tender; bowel sounds normal; no masses,  no organomegaly Male genitalia: normal, catheter in place wtih tea colored urine that is non-foul.  Extremities: extremities normal, atraumatic, no cyanosis or edema Pulses: 2+ and symmetric Skin: Skin color, texture, turgor normal. No rashes or lesions Lymph nodes:  Cervical, supraclavicular, and axillary nodes normal. Neurologic: Grossly normal Incision/Wound: midline incision c/d/i with staples. [place] abd distension / obesity, no rebound / guarding. JP with scant serosanguinous output.   Lab Results:   Recent Labs  08/02/16 0551 08/03/16 0536  WBC 10.0 10.1  HGB 11.9* 11.5*  HCT 37.8* 37.2*  PLT 236 232   BMET  Recent Labs  08/02/16 0551 08/03/16 0536  NA 139 136  K 3.3* 4.4  CL 106 100*  CO2 25 30  GLUCOSE 148* 165*  BUN 12 13  CREATININE 0.99 1.24  CALCIUM 7.6* 8.1*   PT/INR No results for input(s): LABPROT, INR in the last 72 hours. ABG No results for input(s): PHART, HCO3 in the last 72 hours.  Invalid input(s): PCO2, PO2  Studies/Results: No results found.  Anti-infectives: Anti-infectives    Start     Dose/Rate Route Frequency Ordered Stop   07/31/16 2153  dextrose 5 % with cefTRIAXone (ROCEPHIN) ADS Med  Status:  Discontinued    Comments:  Cochran, [name]   : cabinet override      07/31/16 2153 08/01/16 0050   07/31/16 2130  cefTRIAXone (ROCEPHIN) 1 g in dextrose 5 % 50 mL IVPB     1 g 120 mL/hr over 30 Minutes Intravenous  Once 07/31/16 2120 07/31/16 2211   07/31/16 1937  ceFAZolin (ANCEF) 2-4 GM/100ML-% IVPB    Comments:  [name]   : cabinet override      07/31/16 1937 08/01/16 0744      Assessment/Plan:  1 - Right Ureteral Stone / Ureteral Injury - doing well POD 3. Remain in  house with current tubes / drains.   2 - Pain Control - DC PCA, transition to PRN's and continue scheduedl tylenol to reduce opiod req.   3 - Post-op Ileus - Continue carb mod diet and proph senna. Ducolax SSP x 1 today.   4 - Disposition / Rehab - PT eval today as he may even require HHPT or rehab stay given current functional capacity.   [facility], [name] 08/04/2016

## 2016-08-04 NOTE — Progress Notes (Signed)
PROGRESS NOTE    KENDEL Black  GUR:[id number] DOB: February 27, 1956 DOA: 07/31/2016 PCP: [name]     Brief Narrative:  61 y/o man admitted to the hospital on 5/16 from home due to rectal bleeding. CT scan showed right-sided hydronephrosis and 3 ureter stones. Seen by GU who performed a ureteroscopy with initial plans for stent placement, however ended up requiring emergent exploratory surgery and ureteral reconstruction for avulsed ureter at time of ureteroscopy. GU has D/C'd PCA pump. Will transition over to PO pain meds based on PCA requirements.  Assessment & Plan:   Active Problems:   Microcytic anemia   Benign hypertension   Rectal bleeding   Uncontrolled type 2 diabetes mellitus with hyperglycemia (HCC)   Urolithiasis   Nephrolithiasis   Ureteral trauma, subsequent encounter   Rectal Bleeding -No further bleeding since admission. -Hb has been stable.  Nephrolithiasis with Right Sided Hydronephrosis -With ureter trauma and avulsion at time of ureteroscopy s/p repair. -Appreciate urology assistance. -Will transition to PO pain meds for ease of administration and facilitation of DC. -Used 11.4 mg of IV hydromorphone via PCA past 24 hours, which is equivalent to 171 mg of PO Morphine. Will start MS Contin 75 mg BID with MSIR 15 mg q 3 hrs PRN for breakthru pain. -Will add miralax to bisacodyl to augment bowel regimen.  HTN -Good control. -Continue BB, ARB, HCTZ.  DM II -Fair control on current regimen. -Continue to monitor and adjust regimen as needed.   DVT prophylaxis: SQ heparin Code Status: full code Family Communication: patient only Disposition Plan: as per urology; no active medical issues.  Consultants:   Urology  Procedures:   As above  Antimicrobials:  Anti-infectives    Start     Dose/Rate Route Frequency Ordered Stop   07/31/16 2153  dextrose 5 % with cefTRIAXone (ROCEPHIN) ADS Med  Status:  Discontinued    Comments:  Cochran, Ron   : cabinet  override      07/31/16 2153 08/01/16 0050   07/31/16 2130  cefTRIAXone (ROCEPHIN) 1 g in dextrose 5 % 50 mL IVPB     1 g 120 mL/hr over 30 Minutes Intravenous  Once 07/31/16 2120 07/31/16 2211   07/31/16 1937  ceFAZolin (ANCEF) 2-4 GM/100ML-% IVPB    Comments:  Delena Bali   : cabinet override      07/31/16 1937 08/01/16 0744       Subjective: In bed, still complains of significant abdominal pain. Last BM was 3 days ago.  Objective: Vitals:   08/04/16 0427 08/04/16 0428 08/04/16 0433 08/04/16 0822  BP: 112/64     Pulse: 60     Resp: 18 18  15   Temp: 97.8 F (36.6 C)     TempSrc: Oral     SpO2: 98% 98%  98%  Weight:   97.1 kg (214 lb 1.1 oz)   Height:        Intake/Output Summary (Last 24 hours) at 08/04/16 1114 Last data filed at 08/04/16 0428  Gross per 24 hour  Intake              805 ml  Output             1445 ml  Net             -640 ml   Filed Weights   08/02/16 0407 08/03/16 0444 08/04/16 0433  Weight: 95.6 kg (210 lb 12.2 oz) 96.8 kg (213 lb 6.5 oz) 97.1 kg (214 lb  1.1 oz)    Examination:  General exam: Alert, awake, oriented x 3 Respiratory system: Clear to auscultation. Respiratory effort normal. Cardiovascular system:RRR. No murmurs, rubs, gallops. Gastrointestinal system: Abdomen is distended, soft and nontender. Hypoactive bowel sounds. Central nervous system: Alert and oriented. No focal neurological deficits. Extremities: No C/C/E, +pedal pulses Skin: No rashes, lesions or ulcers Psychiatry: Judgement and insight appear normal. Mood & affect appropriate.        Data Reviewed: I have personally reviewed following labs and imaging studies  CBC:  Recent Labs Lab 07/31/16 0037 07/31/16 1154 08/01/16 0324 08/02/16 0551 08/03/16 0536  WBC 12.9* 9.1 18.2* 10.0 10.1  HGB 15.8 13.6 14.0 11.9* 11.5*  HCT 44.5 41.2 42.4 37.8* 37.2*  MCV 85.2 86.4 87.8 91.5 92.3  PLT 288 249 251 236 102   Basic Metabolic Panel:  Recent Labs Lab  07/31/16 0037 07/31/16 1154 08/01/16 0324 08/02/16 0551 08/03/16 0536  NA 136 138 137 139 136  K 3.8 3.8 4.4 3.3* 4.4  CL 100* 105 104 106 100*  CO2 23 25 19* 25 30  GLUCOSE 253* 148* 315* 148* 165*  BUN 22* 20 17 12 13   CREATININE 1.22 1.37* 1.40* 0.99 1.24  CALCIUM 9.8 8.8* 8.3* 7.6* 8.1*   GFR: Estimated Creatinine Clearance: 67.4 mL/min (by C-G formula based on SCr of 1.24 mg/dL). Liver Function Tests:  Recent Labs Lab 07/31/16 0037  AST 34  ALT 43  ALKPHOS 95  BILITOT 0.6  PROT 8.1  ALBUMIN 4.2   No results for input(s): LIPASE, AMYLASE in the last 168 hours. No results for input(s): AMMONIA in the last 168 hours. Coagulation Profile:  Recent Labs Lab 07/31/16 0129  INR 0.94   Cardiac Enzymes: No results for input(s): CKTOTAL, CKMB, CKMBINDEX, TROPONINI in the last 168 hours. BNP (last 3 results) No results for input(s): PROBNP in the last 8760 hours. HbA1C: No results for input(s): HGBA1C in the last 72 hours. CBG:  Recent Labs Lab 08/03/16 1622 08/03/16 2001 08/04/16 0014 08/04/16 0431 08/04/16 0743  GLUCAP 170* 208* 143* 86 103*   Lipid Profile: No results for input(s): CHOL, HDL, LDLCALC, TRIG, CHOLHDL, LDLDIRECT in the last 72 hours. Thyroid Function Tests: No results for input(s): TSH, T4TOTAL, FREET4, T3FREE, THYROIDAB in the last 72 hours. Anemia Panel: No results for input(s): VITAMINB12, FOLATE, FERRITIN, TIBC, IRON, RETICCTPCT in the last 72 hours. Urine analysis:    Component Value Date/Time   COLORURINE YELLOW 07/31/2016 0625   APPEARANCEUR CLEAR 07/31/2016 0625   LABSPEC 1.016 07/31/2016 0625   PHURINE 6.0 07/31/2016 0625   GLUCOSEU >=500 (A) 07/31/2016 0625   HGBUR NEGATIVE 07/31/2016 0625   BILIRUBINUR NEGATIVE 07/31/2016 0625   KETONESUR NEGATIVE 07/31/2016 0625   PROTEINUR NEGATIVE 07/31/2016 0625   UROBILINOGEN 0.2 04/18/2008 1153   NITRITE NEGATIVE 07/31/2016 0625   LEUKOCYTESUR NEGATIVE 07/31/2016 0625   Sepsis  Labs: @LABRCNTIP (procalcitonin:4,lacticidven:4)  ) Recent Results (from the past 240 hour(s))  Surgical PCR screen     Status: None   Collection Time: 07/31/16  1:25 PM  Result Value Ref Range Status   MRSA, PCR NEGATIVE NEGATIVE Final   Staphylococcus aureus NEGATIVE NEGATIVE Final    Comment:        The Xpert SA Assay (FDA approved for NASAL specimens in patients over 65 years of age), is one component of a comprehensive surveillance program.  Test performance has been validated by [facility] for patients greater than or equal to 84 year old. It is  not intended to diagnose infection nor to guide or monitor treatment.          Radiology Studies: No results found.      Scheduled Meds: . acetaminophen  1,000 mg Oral Q8H  . amLODipine  5 mg Oral Daily  . aspirin EC  81 mg Oral Daily  . heparin  5,000 Units Subcutaneous Q8H  . losartan  100 mg Oral Daily   And  . hydrochlorothiazide  25 mg Oral Daily  . insulin aspart  0-15 Units Subcutaneous Q4H  . insulin glargine  12 Units Subcutaneous QHS  . metoprolol tartrate  50 mg Oral Daily  . morphine  75 mg Oral Q12H  . polyethylene glycol  17 g Oral Daily  . senna-docusate  1 tablet Oral BID  . sertraline  50 mg Oral Daily   Continuous Infusions:    LOS: 3 days    Time spent: 25 minutes. Greater than 50% of this time was spent in direct contact with the patient coordinating care.     Lelon Frohlich, MD [facility] Pager [phone]  If 7PM-7AM, please contact night-coverage [email or web address] Password [email or web address] 08/04/2016, 11:14 AM

## 2016-08-05 LAB — GLUCOSE, CAPILLARY
GLUCOSE-CAPILLARY: 180 mg/dL — AB (ref 65–99)
GLUCOSE-CAPILLARY: 150 mg/dL — AB (ref 65–99)
GLUCOSE-CAPILLARY: 155 mg/dL — AB (ref 65–99)
Glucose-Capillary: 121 mg/dL — ABNORMAL HIGH (ref 65–99)
Glucose-Capillary: 127 mg/dL — ABNORMAL HIGH (ref 65–99)
Glucose-Capillary: 143 mg/dL — ABNORMAL HIGH (ref 65–99)
Glucose-Capillary: 143 mg/dL — ABNORMAL HIGH (ref 65–99)
Glucose-Capillary: 148 mg/dL — ABNORMAL HIGH (ref 65–99)

## 2016-08-05 LAB — CREATININE, FLUID (PLEURAL, PERITONEAL, JP DRAINAGE): Creat, Fluid: 23.3 mg/dL

## 2016-08-05 MED ORDER — BISACODYL 10 MG RE SUPP
10.0000 mg | Freq: Two times a day (BID) | RECTAL | Status: DC
  Administered 2016-08-05 – 2016-08-07 (×4): 10 mg via RECTAL
  Filled 2016-08-05 (×4): qty 1

## 2016-08-05 NOTE — Care Management Important Message (Signed)
Important Message  Patient Details  Name: [name] MRN: [id number] Date of Birth: 08-01-1955   Medicare Important Message Given:  Yes    Kerin Salen 08/05/2016, 10:32 AMImportant Message  Patient Details  Name: [name] MRN: [id number] Date of Birth: [birth year]   Medicare Important Message Given:  Yes    Kerin Salen 08/05/2016, 10:32 AM

## 2016-08-05 NOTE — Progress Notes (Signed)
PROGRESS NOTE    [name]  HYI:[id number] DOB: 06-Oct-1955 DOA: 07/31/2016 PCP: [name]     Brief Narrative:  61 y/o man admitted to the hospital on 5/16 from home due to rectal bleeding. CT scan showed right-sided hydronephrosis and 3 ureter stones. Seen by GU who performed a ureteroscopy with initial plans for stent placement, however ended up requiring emergent exploratory surgery and ureteral reconstruction for avulsed ureter at time of ureteroscopy. Pain well controlled on current regimen. GU to probably remove JP drain today.  Assessment & Plan:   Active Problems:   Microcytic anemia   Benign hypertension   Rectal bleeding   Uncontrolled type 2 diabetes mellitus with hyperglycemia (HCC)   Urolithiasis   Nephrolithiasis   Ureteral trauma, subsequent encounter   Rectal Bleeding -No further bleeding since admission. -Hb has been stable.  Nephrolithiasis with Right Sided Hydronephrosis -With ureter trauma and avulsion at time of ureteroscopy s/p repair. -Appreciate urology assistance. -Has been transitioned to PO pain meds based on PCA requirements: MS COntin 75 mg BID. Has not required any MSIR past 24 hours, so will not further adjust MS Contin today. -Will add miralax to bisacodyl to augment bowel regimen. -Per GU, foley to remain in place for 2 weeks.  HTN -Good control. -Continue BB, ARB, HCTZ.  DM II -Fair control on current regimen. -Continue to monitor and adjust regimen as needed.   DVT prophylaxis: SQ heparin Code Status: full code Family Communication: patient only Disposition Plan: anticipate DC SNF in 24 hours.  Consultants:   Urology  Procedures:   As above  Antimicrobials:  Anti-infectives    Start     Dose/Rate Route Frequency Ordered Stop   07/31/16 2153  dextrose 5 % with cefTRIAXone (ROCEPHIN) ADS Med  Status:  Discontinued    Comments:  Cochran, Ron   : cabinet override      07/31/16 2153 08/01/16 0050   07/31/16 2130   cefTRIAXone (ROCEPHIN) 1 g in dextrose 5 % 50 mL IVPB     1 g 120 mL/hr over 30 Minutes Intravenous  Once 07/31/16 2120 07/31/16 2211   07/31/16 1937  ceFAZolin (ANCEF) 2-4 GM/100ML-% IVPB    Comments:  Delena Bali   : cabinet override      07/31/16 1937 08/01/16 0744       Subjective: In bed, pain is much improved.   Objective: Vitals:   08/04/16 0822 08/04/16 1524 08/04/16 2005 08/05/16 0425  BP:  129/74 (!) 112/58 113/60  Pulse:  70 73 79  Resp: 15 18 18 18   Temp:  97.7 F (36.5 C) 98 F (36.7 C) 97.8 F (36.6 C)  TempSrc:  Oral Oral Oral  SpO2: 98% 98% 95% 96%  Weight:    98.3 kg (216 lb 11.4 oz)  Height:        Intake/Output Summary (Last 24 hours) at 08/05/16 1122 Last data filed at 08/05/16 0428  Gross per 24 hour  Intake                0 ml  Output             1445 ml  Net            -1445 ml   Filed Weights   08/03/16 0444 08/04/16 0433 08/05/16 0425  Weight: 96.8 kg (213 lb 6.5 oz) 97.1 kg (214 lb 1.1 oz) 98.3 kg (216 lb 11.4 oz)    Examination:  General exam: Alert, awake, oriented x  3 Respiratory system: Clear to auscultation. Respiratory effort normal. Cardiovascular system:RRR. No murmurs, rubs, gallops. Gastrointestinal system: Abdomen is nondistended, soft and slightly tender to palpation. . Normal bowel sounds heard. Central nervous system: Alert and oriented. No focal neurological deficits. Extremities: No C/C/E, +pedal pulses Skin: No rashes, lesions or ulcers Psychiatry: Judgement and insight appear normal. Mood & affect appropriate.         Data Reviewed: I have personally reviewed following labs and imaging studies  CBC:  Recent Labs Lab 07/31/16 0037 07/31/16 1154 08/01/16 0324 08/02/16 0551 08/03/16 0536  WBC 12.9* 9.1 18.2* 10.0 10.1  HGB 15.8 13.6 14.0 11.9* 11.5*  HCT 44.5 41.2 42.4 37.8* 37.2*  MCV 85.2 86.4 87.8 91.5 92.3  PLT 288 249 251 236 423   Basic Metabolic Panel:  Recent Labs Lab 07/31/16 0037  07/31/16 1154 08/01/16 0324 08/02/16 0551 08/03/16 0536  NA 136 138 137 139 136  K 3.8 3.8 4.4 3.3* 4.4  CL 100* 105 104 106 100*  CO2 23 25 19* 25 30  GLUCOSE 253* 148* 315* 148* 165*  BUN 22* 20 17 12 13   CREATININE 1.22 1.37* 1.40* 0.99 1.24  CALCIUM 9.8 8.8* 8.3* 7.6* 8.1*   GFR: Estimated Creatinine Clearance: 67.8 mL/min (by C-G formula based on SCr of 1.24 mg/dL). Liver Function Tests:  Recent Labs Lab 07/31/16 0037  AST 34  ALT 43  ALKPHOS 95  BILITOT 0.6  PROT 8.1  ALBUMIN 4.2   No results for input(s): LIPASE, AMYLASE in the last 168 hours. No results for input(s): AMMONIA in the last 168 hours. Coagulation Profile:  Recent Labs Lab 07/31/16 0129  INR 0.94   Cardiac Enzymes: No results for input(s): CKTOTAL, CKMB, CKMBINDEX, TROPONINI in the last 168 hours. BNP (last 3 results) No results for input(s): PROBNP in the last 8760 hours. HbA1C: No results for input(s): HGBA1C in the last 72 hours. CBG:  Recent Labs Lab 08/04/16 1730 08/04/16 2008 08/05/16 0052 08/05/16 0422 08/05/16 0811  GLUCAP 203* 176* 121* 150* 143*   Lipid Profile: No results for input(s): CHOL, HDL, LDLCALC, TRIG, CHOLHDL, LDLDIRECT in the last 72 hours. Thyroid Function Tests: No results for input(s): TSH, T4TOTAL, FREET4, T3FREE, THYROIDAB in the last 72 hours. Anemia Panel: No results for input(s): VITAMINB12, FOLATE, FERRITIN, TIBC, IRON, RETICCTPCT in the last 72 hours. Urine analysis:    Component Value Date/Time   COLORURINE YELLOW 07/31/2016 0625   APPEARANCEUR CLEAR 07/31/2016 0625   LABSPEC 1.016 07/31/2016 0625   PHURINE 6.0 07/31/2016 0625   GLUCOSEU >=500 (A) 07/31/2016 0625   HGBUR NEGATIVE 07/31/2016 0625   BILIRUBINUR NEGATIVE 07/31/2016 0625   KETONESUR NEGATIVE 07/31/2016 0625   PROTEINUR NEGATIVE 07/31/2016 0625   UROBILINOGEN 0.2 04/18/2008 1153   NITRITE NEGATIVE 07/31/2016 0625   LEUKOCYTESUR NEGATIVE 07/31/2016 0625   Sepsis  Labs: @LABRCNTIP (procalcitonin:4,lacticidven:4)  ) Recent Results (from the past 240 hour(s))  Surgical PCR screen     Status: None   Collection Time: 07/31/16  1:25 PM  Result Value Ref Range Status   MRSA, PCR NEGATIVE NEGATIVE Final   Staphylococcus aureus NEGATIVE NEGATIVE Final    Comment:        The Xpert SA Assay (FDA approved for NASAL specimens in patients over 13 years of age), is one component of a comprehensive surveillance program.  Test performance has been validated by [facility] for patients greater than or equal to 3 year old. It is not intended to diagnose infection nor to  guide or monitor treatment.          Radiology Studies: No results found.      Scheduled Meds: . acetaminophen  1,000 mg Oral Q8H  . amLODipine  5 mg Oral Daily  . aspirin EC  81 mg Oral Daily  . bisacodyl  10 mg Rectal BID  . heparin  5,000 Units Subcutaneous Q8H  . losartan  100 mg Oral Daily   And  . hydrochlorothiazide  25 mg Oral Daily  . insulin aspart  0-15 Units Subcutaneous Q4H  . insulin glargine  12 Units Subcutaneous QHS  . metoprolol tartrate  50 mg Oral Daily  . morphine  75 mg Oral Q12H  . polyethylene glycol  17 g Oral Daily  . senna-docusate  1 tablet Oral BID  . sertraline  50 mg Oral Daily   Continuous Infusions:    LOS: 4 days    Time spent: 25 minutes. Greater than 50% of this time was spent in direct contact with the patient coordinating care.     Lelon Frohlich, MD [facility] Pager [phone]  If 7PM-7AM, please contact night-coverage [email or web address] Password [email or web address] 08/05/2016, 11:22 AM

## 2016-08-05 NOTE — Progress Notes (Signed)
Urology Inpatient Progress Report  Rectal bleeding [K62.5] Abnormal CT of the abdomen [R93.5] Right ureteral stone [N20.1]  Procedure(s): CYSTOSCOPY WITH RIGHT  RETROGRADE PYELOGRAM, URETEROSCOPY URETERAL REIMPLANT, right boari flap, right psoas hitch  5 Days Post-Op   Intv/Subj: Doing well Mild nausea - tolerating small amts of carb controlled diet Passing gas, no bowel movement since surgery Able to get up and move around - not yet independently Pain well controlled on oral medications   Active Problems:   Microcytic anemia   Benign hypertension   Rectal bleeding   Uncontrolled type 2 diabetes mellitus with hyperglycemia ([place])   Urolithiasis   Nephrolithiasis   Ureteral trauma, subsequent encounter  Current Facility-Administered Medications  Medication Dose Route Frequency Provider Last Rate Last Dose  . acetaminophen (TYLENOL) tablet 1,000 mg  1,000 mg Oral Q8H Alexis Frock, MD   1,000 mg at 08/05/16 0539  . amLODipine (NORVASC) tablet 5 mg  5 mg Oral Daily [name], MD   5 mg at 08/04/16 0830  . aspirin EC tablet 81 mg  81 mg Oral Daily [name], MD   81 mg at 08/04/16 7673  . heparin injection 5,000 Units  5,000 Units Subcutaneous Q8H [name], MD   5,000 Units at 08/05/16 [phone]  . hydrALAZINE (APRESOLINE) injection 10 mg  10 mg Intravenous Q4H PRN Rise Patience, MD      . losartan (COZAAR) tablet 100 mg  100 mg Oral Daily [name], MD   100 mg at 08/04/16 0831   And  . hydrochlorothiazide (HYDRODIURIL) tablet 25 mg  25 mg Oral Daily [name], MD   25 mg at 08/04/16 0830  . insulin aspart (novoLOG) injection 0-15 Units  0-15 Units Subcutaneous Q4H Annita Brod, MD   2 Units at 08/05/16 0509  . insulin glargine (LANTUS) injection 12 Units  12 Units Subcutaneous QHS [name], MD   12 Units at 08/04/16 2133  . LORazepam (ATIVAN) tablet 0.5 mg  0.5 mg Oral QHS PRN [name], MD   0.5  mg at 08/01/16 1954  . metoprolol tartrate (LOPRESSOR) tablet 50 mg  50 mg Oral Daily [name], MD   50 mg at 08/04/16 0830  . morphine (MS CONTIN) 12 hr tablet 75 mg  75 mg Oral Q12H Isaac Bliss, Rayford Halsted, MD   75 mg at 08/04/16 2149  . morphine (MSIR) tablet 15 mg  15 mg Oral Q3H PRN Isaac Bliss, Rayford Halsted, MD      . naloxone [facility]) injection 0.4 mg  0.4 mg Intravenous PRN [name], MD       And  . sodium chloride flush (NS) 0.9 % injection 9 mL  9 mL Intravenous PRN [name], MD      . ondansetron [facility]) tablet 4 mg  4 mg Oral Q6H PRN Rise Patience, MD       Or  . ondansetron [facility]) injection 4 mg  4 mg Intravenous Q6H PRN Rise Patience, MD   4 mg at 07/31/16 2312  . ondansetron (ZOFRAN) injection 4 mg  4 mg Intravenous Q6H PRN [name], MD   4 mg at 08/04/16 0855  . oxyCODONE (Oxy IR/ROXICODONE) immediate release tablet 5-15 mg  5-15 mg Oral Q4H PRN [name], MD   5 mg at 08/03/16 1922  . polyethylene glycol (MIRALAX / GLYCOLAX) packet 17 g  17 g Oral Daily Isaac Bliss, [place]  Y, MD   17 g at 08/04/16 1751  . senna-docusate (Senokot-S) tablet 1 tablet  1 tablet Oral BID Alexis Frock, MD   1 tablet at 08/04/16 2133  . sertraline (ZOLOFT) tablet 50 mg  50 mg Oral Daily [name], MD   50 mg at 08/04/16 0831     Objective: Vital: Vitals:   08/04/16 0822 08/04/16 1524 08/04/16 2005 08/05/16 0425  BP:  129/74 (!) 112/58 113/60  Pulse:  70 73 79  Resp: 15 18 18 18   Temp:  97.7 F (36.5 C) 98 F (36.7 C) 97.8 F (36.6 C)  TempSrc:  Oral Oral Oral  SpO2: 98% 98% 95% 96%  Weight:    98.3 kg (216 lb 11.4 oz)  Height:       I/Os: I/O last 3 completed shifts: In: 81 [I.V.:685] Out: 1610 [Urine:1700; Drains:150]  Physical Exam:  General: Patient is in no apparent distress Lungs: Normal respiratory effort, chest expands symmetrically. GI: Incision is dressed -  c/d/i. Abdomen is  soft JP drain with serosanguinous drainage Foley: blood tinged urine Ext: lower extremities symmetric  Lab Results:  Recent Labs  08/03/16 0536  WBC 10.1  HGB 11.5*  HCT 37.2*    Recent Labs  08/03/16 0536  NA 136  K 4.4  CL 100*  CO2 30  GLUCOSE 165*  BUN 13  CREATININE 1.24  CALCIUM 8.1*   No results for input(s): LABPT, INR in the last 72 hours. No results for input(s): LABURIN in the last 72 hours. Results for orders placed or performed during the hospital encounter of 07/31/16  Surgical PCR screen     Status: None   Collection Time: 07/31/16  1:25 PM  Result Value Ref Range Status   MRSA, PCR NEGATIVE NEGATIVE Final   Staphylococcus aureus NEGATIVE NEGATIVE Final    Comment:        The Xpert SA Assay (FDA approved for NASAL specimens in patients over 93 years of age), is one component of a comprehensive surveillance program.  Test performance has been validated by [facility] for patients greater than or equal to 22 year old. It is not intended to diagnose infection nor to guide or monitor treatment.     Studies/Results: No results found.  Assessment: Procedure(s): CYSTOSCOPY WITH RIGHT  RETROGRADE PYELOGRAM, URETEROSCOPY URETERAL REIMPLANT, right boari flap, right psoas hitch, 5 Days Post-Op  doing well.  Plan: Needs to be up and out of bed most of the day - continue to work with PT and staff to get more ambulatory. Sending JP for creatinine - would like to remove drain prior to discharge. Foley will remain for ~ 2 weeks. Labs tomorrow. Appreciate hospitalist helping out with chronic medical problems that are currently well managed.  Likely home in 2 days.    Louis Meckel, MD Urology 08/05/2016, 7:54 AM

## 2016-08-05 NOTE — Progress Notes (Signed)
CSW following for disposition/DC planning. Pt from [place] for short term rehab and plan to return at DC. PT eval 5/18 recommends pt return to SNF at DC for continued therapy.  Contacted facility with update. Prepared to have pt return when ready.  Will continue following to assist with DC.  Sharren Bridge, MSW, LCSW Clinical Social Work 08/05/2016 [phone]

## 2016-08-06 ENCOUNTER — Encounter (HOSPITAL_COMMUNITY): Payer: Self-pay | Admitting: Urology

## 2016-08-06 LAB — GLUCOSE, CAPILLARY
Glucose-Capillary: 123 mg/dL — ABNORMAL HIGH (ref 65–99)
Glucose-Capillary: 171 mg/dL — ABNORMAL HIGH (ref 65–99)
Glucose-Capillary: 171 mg/dL — ABNORMAL HIGH (ref 65–99)
Glucose-Capillary: 198 mg/dL — ABNORMAL HIGH (ref 65–99)
Glucose-Capillary: 167 mg/dL — ABNORMAL HIGH (ref 65–99)

## 2016-08-06 LAB — CBC
HEMATOCRIT: 35.8 % — AB (ref 39.0–52.0)
HEMOGLOBIN: 11.7 g/dL — AB (ref 13.0–17.0)
MCH: 29.4 pg (ref 26.0–34.0)
MCHC: 32.7 g/dL (ref 30.0–36.0)
MCV: 89.9 fL (ref 78.0–100.0)
Platelets: 326 10*3/uL (ref 150–400)
RBC: 3.98 MIL/uL — AB (ref 4.22–5.81)
RDW: 13.6 % (ref 11.5–15.5)
WBC: 8.8 10*3/uL (ref 4.0–10.5)

## 2016-08-06 LAB — BASIC METABOLIC PANEL
ANION GAP: 10 (ref 5–15)
BUN: 17 mg/dL (ref 6–20)
CALCIUM: 8.8 mg/dL — AB (ref 8.9–10.3)
CO2: 27 mmol/L (ref 22–32)
Chloride: 101 mmol/L (ref 101–111)
Creatinine, Ser: 1.41 mg/dL — ABNORMAL HIGH (ref 0.61–1.24)
GFR calc Af Amer: >60 mL/min (ref >60)
GFR, EST NON AFRICAN AMERICAN: 52 mL/min — AB (ref >60)
Glucose, Bld: 132 mg/dL — ABNORMAL HIGH (ref 65–99)
POTASSIUM: 3.4 mmol/L — AB (ref 3.5–5.1)
Sodium: 138 mmol/L (ref 135–145)

## 2016-08-06 MED ORDER — POTASSIUM CHLORIDE 20 MEQ/15ML (10%) PO SOLN
20.0000 meq | Freq: Two times a day (BID) | ORAL | Status: AC
  Administered 2016-08-06 (×2): 20 meq via ORAL
  Filled 2016-08-06 (×3): qty 15

## 2016-08-06 MED ORDER — SODIUM CHLORIDE 0.45 % IV SOLN
INTRAVENOUS | Status: DC
  Administered 2016-08-06 – 2016-08-07 (×3): via INTRAVENOUS

## 2016-08-06 MED ORDER — POLYETHYLENE GLYCOL 3350 17 G PO PACK
34.0000 g | PACK | Freq: Every day | ORAL | Status: DC
  Administered 2016-08-07 – 2016-08-08 (×2): 34 g via ORAL
  Filled 2016-08-06 (×3): qty 2

## 2016-08-06 NOTE — Consult Note (Signed)
   [facility] CM Inpatient Consult   08/06/2016  [name] 12/28/1955 [id number]     Patient screened for potential [facility] Care Management services. Chart reviewed. Noted current discharge plan is to return to SNF.  There are no identifiable [facility] Care Management needs at this time. If patient's post hospital needs change, please place a [facility] Care Management consult. For questions please contact:  Marthenia Rolling, [place], RN,BSN [facility] Liaison [phone]

## 2016-08-06 NOTE — Progress Notes (Signed)
Urology Inpatient Progress Report  Rectal bleeding [K62.5] Abnormal CT of the abdomen [R93.5] Right ureteral stone [N20.1]  Procedure(s): CYSTOSCOPY WITH RIGHT  RETROGRADE PYELOGRAM, URETEROSCOPY URETERAL REIMPLANT, right boari flap, right psoas hitch  6 Days Post-Op   Intv/Subj: Eating small amounts - without nausea, passing gas. No pain Walking some with PT   Active Problems:   Microcytic anemia   Benign hypertension   Rectal bleeding   Uncontrolled type 2 diabetes mellitus with hyperglycemia ([place])   Urolithiasis   Nephrolithiasis   Ureteral trauma, subsequent encounter  Current Facility-Administered Medications  Medication Dose Route Frequency Provider Last Rate Last Dose  . 0.45 % sodium chloride infusion   Intravenous Continuous [name], MD      . acetaminophen (TYLENOL) tablet 1,000 mg  1,000 mg Oral Marella Chimes, MD   1,000 mg at 08/06/16 0525  . amLODipine (NORVASC) tablet 5 mg  5 mg Oral Daily [name], MD   5 mg at 08/05/16 0857  . aspirin EC tablet 81 mg  81 mg Oral Daily [name], MD   81 mg at 08/05/16 1610  . bisacodyl (DULCOLAX) suppository 10 mg  10 mg Rectal BID [name], MD   10 mg at 08/05/16 2126  . heparin injection 5,000 Units  5,000 Units Subcutaneous Q8H [name], MD   5,000 Units at 08/06/16 9604  . hydrALAZINE (APRESOLINE) injection 10 mg  10 mg Intravenous Q4H PRN Rise Patience, MD      . losartan (COZAAR) tablet 100 mg  100 mg Oral Daily [name], MD   100 mg at 08/05/16 5409   And  . hydrochlorothiazide (HYDRODIURIL) tablet 25 mg  25 mg Oral Daily [name], MD   25 mg at 08/05/16 0858  . insulin aspart (novoLOG) injection 0-15 Units  0-15 Units Subcutaneous Q4H Annita Brod, MD   2 Units at 08/06/16 0524  . insulin glargine (LANTUS) injection 12 Units  12 Units Subcutaneous QHS [name], MD   12 Units at 08/05/16 2135  . LORazepam (ATIVAN)  tablet 0.5 mg  0.5 mg Oral QHS PRN [name], MD   0.5 mg at 08/01/16 1954  . metoprolol tartrate (LOPRESSOR) tablet 50 mg  50 mg Oral Daily [name], MD   50 mg at 08/05/16 0857  . naloxone [facility]) injection 0.4 mg  0.4 mg Intravenous PRN [name], MD       And  . sodium chloride flush (NS) 0.9 % injection 9 mL  9 mL Intravenous PRN [name], MD      . ondansetron [facility]) tablet 4 mg  4 mg Oral Q6H PRN Rise Patience, MD       Or  . ondansetron [facility]) injection 4 mg  4 mg Intravenous Q6H PRN Rise Patience, MD   4 mg at 07/31/16 2312  . ondansetron (ZOFRAN) injection 4 mg  4 mg Intravenous Q6H PRN [name], MD   4 mg at 08/04/16 0855  . oxyCODONE (Oxy IR/ROXICODONE) immediate release tablet 5-15 mg  5-15 mg Oral Q4H PRN [name], MD   5 mg at 08/03/16 1922  . polyethylene glycol (MIRALAX / GLYCOLAX) packet 34 g  34 g Oral Daily [name], MD      . potassium chloride 20 MEQ/15ML (10%) solution 20 mEq  20 mEq Oral BID [name], MD      .  senna-docusate (Senokot-S) tablet 1 tablet  1 tablet Oral BID Alexis Frock, MD   1 tablet at 08/05/16 2126  . sertraline (ZOLOFT) tablet 50 mg  50 mg Oral Daily [name], MD   50 mg at 08/05/16 0857     Objective: Vital: Vitals:   08/05/16 0425 08/05/16 1703 08/05/16 2048 08/06/16 0450  BP: 113/60 (!) 103/58 121/62 122/70  Pulse: 79 63 76 79  Resp: 18 18 17 18   Temp: 97.8 F (36.6 C) 98.9 F (37.2 C) 97.5 F (36.4 C) 97.5 F (36.4 C)  TempSrc: Oral Oral Oral Oral  SpO2: 96% 94% 90% 90%  Weight: 98.3 kg (216 lb 11.4 oz)   92.6 kg (204 lb 2.3 oz)  Height:       I/Os: I/O last 3 completed shifts: In: -  Out: 665 [Urine:525; Drains:140]  Physical Exam:  General: Patient is in no apparent distress Lungs: Normal respiratory effort, chest expands symmetrically. GI: c/d/i. Abdomen is softly distended JP drain with serosanguinous  drainage Foley: blood tinged urine Ext: lower extremities symmetric  Lab Results:  Recent Labs  08/06/16 0502  WBC 8.8  HGB 11.7*  HCT 35.8*    Recent Labs  08/06/16 0502  NA 138  K 3.4*  CL 101  CO2 27  GLUCOSE 132*  BUN 17  CREATININE 1.41*  CALCIUM 8.8*   No results for input(s): LABPT, INR in the last 72 hours. No results for input(s): LABURIN in the last 72 hours. Results for orders placed or performed during the hospital encounter of 07/31/16  Surgical PCR screen     Status: None   Collection Time: 07/31/16  1:25 PM  Result Value Ref Range Status   MRSA, PCR NEGATIVE NEGATIVE Final   Staphylococcus aureus NEGATIVE NEGATIVE Final    Comment:        The Xpert SA Assay (FDA approved for NASAL specimens in patients over 36 years of age), is one component of a comprehensive surveillance program.  Test performance has been validated by [facility] for patients greater than or equal to 47 year old. It is not intended to diagnose infection nor to guide or monitor treatment.     Studies/Results: No results found.  Assessment: Procedure(s): CYSTOSCOPY WITH RIGHT  RETROGRADE PYELOGRAM, URETEROSCOPY URETERAL REIMPLANT, right boari flap, right psoas hitch, 6 Days Post-Op  doing well. No intake recorded for past two days, although he is eating small amts. JP drain demonstrates on-going urinary tract leak, will need to stay in for short term, likely to be discharged with drain. No bowel movement since admission - narcotic ileus?  Plan: Restarted IVF Double miralax Discontinued MS contin, placed patient on Oxycodone PRN Continue with aggressive PT Home with Foley and JP drain. Appreciate the help with chronic medical issues from hospitalist. Likely home in 2 days.    Randy Meckel, MD Urology 08/06/2016, 7:53 AM

## 2016-08-06 NOTE — Progress Notes (Signed)
PT Cancellation Note  Patient Details Name: [name] MRN: [id number] DOB: 1955/07/29   Cancelled Treatment:     pt declined all efforts for any OOB activity using multiple reasons.  Pt has been evaluated and plans to return to SNF.   Rica Koyanagi  PTA [facility]  Acute  Rehab Pager      [phone]

## 2016-08-06 NOTE — Progress Notes (Signed)
PROGRESS NOTE    [name]  ZTI:[id number] DOB: Jul 19, 1955 DOA: 07/31/2016 PCP: [name]     Brief Narrative:  61 y/o man admitted to the hospital on 5/16 from home due to rectal bleeding. CT scan showed right-sided hydronephrosis and 3 ureter stones. Seen by GU who performed a ureteroscopy with initial plans for stent placement, however ended up requiring emergent exploratory surgery and ureteral reconstruction for avulsed ureter at time of ureteroscopy.   Assessment & Plan:   Active Problems:   Microcytic anemia   Benign hypertension   Rectal bleeding   Uncontrolled type 2 diabetes mellitus with hyperglycemia ([facility])   Urolithiasis   Nephrolithiasis   Ureteral trauma, subsequent encounter   Rectal Bleeding -No further bleeding since admission. -Hb has been stable.  Nephrolithiasis with Right Sided Hydronephrosis -With ureter trauma and avulsion at time of ureteroscopy s/p repair. -Appreciate urology assistance. -GU has changed pain regimen today. Will defer further management of post-op pain to them. -Will add miralax to bisacodyl to augment bowel regimen. -Per GU, foley to remain in place for 2 weeks.  HTN -Good control. -Continue BB, ARB, HCTZ.  DM II -Fair control on current regimen. -Continue to monitor and adjust regimen as needed.   DVT prophylaxis: SQ heparin Code Status: full code Family Communication: patient only Disposition Plan: anticipate DC SNF in 24-48 hours.  Consultants:   Urology  Procedures:   As above  Antimicrobials:  Anti-infectives    Start     Dose/Rate Route Frequency Ordered Stop   07/31/16 2153  dextrose 5 % with cefTRIAXone (ROCEPHIN) ADS Med  Status:  Discontinued    Comments:  Cochran, Ron   : cabinet override      07/31/16 2153 08/01/16 0050   07/31/16 2130  cefTRIAXone (ROCEPHIN) 1 g in dextrose 5 % 50 mL IVPB     1 g 120 mL/hr over 30 Minutes Intravenous  Once 07/31/16 2120 07/31/16 2211   07/31/16 1937   ceFAZolin (ANCEF) 2-4 GM/100ML-% IVPB    Comments:  Delena Bali   : cabinet override      07/31/16 1937 08/01/16 0744       Subjective: In bed, pain is much improved.   Objective: Vitals:   08/05/16 0425 08/05/16 1703 08/05/16 2048 08/06/16 0450  BP: 113/60 (!) 103/58 121/62 122/70  Pulse: 79 63 76 79  Resp: 18 18 17 18   Temp: 97.8 F (36.6 C) 98.9 F (37.2 C) 97.5 F (36.4 C) 97.5 F (36.4 C)  TempSrc: Oral Oral Oral Oral  SpO2: 96% 94% 90% 90%  Weight: 98.3 kg (216 lb 11.4 oz)   92.6 kg (204 lb 2.3 oz)  Height:        Intake/Output Summary (Last 24 hours) at 08/06/16 1456 Last data filed at 08/06/16 1235  Gross per 24 hour  Intake              180 ml  Output              710 ml  Net             -530 ml   Filed Weights   08/04/16 0433 08/05/16 0425 08/06/16 0450  Weight: 97.1 kg (214 lb 1.1 oz) 98.3 kg (216 lb 11.4 oz) 92.6 kg (204 lb 2.3 oz)    Examination:  General exam: Alert, awake, oriented x 3 Respiratory system: Clear to auscultation. Respiratory effort normal. Cardiovascular system:RRR. No murmurs, rubs, gallops. Gastrointestinal system: Abdomen is nondistended, soft  and slightly tender to palpation. No organomegaly or masses felt. Normal bowel sounds heard. Central nervous system: Alert and oriented. No focal neurological deficits. Extremities: No C/C/E, +pedal pulses Skin: No rashes, lesions or ulcers Psychiatry: Judgement and insight appear normal. Mood & affect appropriate.       Data Reviewed: I have personally reviewed following labs and imaging studies  CBC:  Recent Labs Lab 07/31/16 1154 08/01/16 0324 08/02/16 0551 08/03/16 0536 08/06/16 0502  WBC 9.1 18.2* 10.0 10.1 8.8  HGB 13.6 14.0 11.9* 11.5* 11.7*  HCT 41.2 42.4 37.8* 37.2* 35.8*  MCV 86.4 87.8 91.5 92.3 89.9  PLT 249 251 236 232 250   Basic Metabolic Panel:  Recent Labs Lab 07/31/16 1154 08/01/16 0324 08/02/16 0551 08/03/16 0536 08/06/16 0502  NA 138 137 139  136 138  K 3.8 4.4 3.3* 4.4 3.4*  CL 105 104 106 100* 101  CO2 25 19* 25 30 27   GLUCOSE 148* 315* 148* 165* 132*  BUN 20 17 12 13 17   CREATININE 1.37* 1.40* 0.99 1.24 1.41*  CALCIUM 8.8* 8.3* 7.6* 8.1* 8.8*   GFR: Estimated Creatinine Clearance: 57.9 mL/min (A) (by C-G formula based on SCr of 1.41 mg/dL (H)). Liver Function Tests:  Recent Labs Lab 07/31/16 0037  AST 34  ALT 43  ALKPHOS 95  BILITOT 0.6  PROT 8.1  ALBUMIN 4.2   No results for input(s): LIPASE, AMYLASE in the last 168 hours. No results for input(s): AMMONIA in the last 168 hours. Coagulation Profile:  Recent Labs Lab 07/31/16 0129  INR 0.94   Cardiac Enzymes: No results for input(s): CKTOTAL, CKMB, CKMBINDEX, TROPONINI in the last 168 hours. BNP (last 3 results) No results for input(s): PROBNP in the last 8760 hours. HbA1C: No results for input(s): HGBA1C in the last 72 hours. CBG:  Recent Labs Lab 08/05/16 2032 08/05/16 2332 08/06/16 0356 08/06/16 0835 08/06/16 1155  GLUCAP 127* 143* 123* 171* 171*   Lipid Profile: No results for input(s): CHOL, HDL, LDLCALC, TRIG, CHOLHDL, LDLDIRECT in the last 72 hours. Thyroid Function Tests: No results for input(s): TSH, T4TOTAL, FREET4, T3FREE, THYROIDAB in the last 72 hours. Anemia Panel: No results for input(s): VITAMINB12, FOLATE, FERRITIN, TIBC, IRON, RETICCTPCT in the last 72 hours. Urine analysis:    Component Value Date/Time   COLORURINE YELLOW 07/31/2016 0625   APPEARANCEUR CLEAR 07/31/2016 0625   LABSPEC 1.016 07/31/2016 0625   PHURINE 6.0 07/31/2016 0625   GLUCOSEU >=500 (A) 07/31/2016 0625   HGBUR NEGATIVE 07/31/2016 0625   BILIRUBINUR NEGATIVE 07/31/2016 0625   KETONESUR NEGATIVE 07/31/2016 0625   PROTEINUR NEGATIVE 07/31/2016 0625   UROBILINOGEN 0.2 04/18/2008 1153   NITRITE NEGATIVE 07/31/2016 0625   LEUKOCYTESUR NEGATIVE 07/31/2016 0625   Sepsis Labs: @LABRCNTIP (procalcitonin:4,lacticidven:4)  ) Recent Results (from the past  240 hour(s))  Surgical PCR screen     Status: None   Collection Time: 07/31/16  1:25 PM  Result Value Ref Range Status   MRSA, PCR NEGATIVE NEGATIVE Final   Staphylococcus aureus NEGATIVE NEGATIVE Final    Comment:        The Xpert SA Assay (FDA approved for NASAL specimens in patients over 66 years of age), is one component of a comprehensive surveillance program.  Test performance has been validated by [facility] for patients greater than or equal to 14 year old. It is not intended to diagnose infection nor to guide or monitor treatment.          Radiology Studies: No results  found.      Scheduled Meds: . acetaminophen  1,000 mg Oral Q8H  . amLODipine  5 mg Oral Daily  . aspirin EC  81 mg Oral Daily  . bisacodyl  10 mg Rectal BID  . heparin  5,000 Units Subcutaneous Q8H  . losartan  100 mg Oral Daily   And  . hydrochlorothiazide  25 mg Oral Daily  . insulin aspart  0-15 Units Subcutaneous Q4H  . insulin glargine  12 Units Subcutaneous QHS  . metoprolol tartrate  50 mg Oral Daily  . polyethylene glycol  34 g Oral Daily  . potassium chloride  20 mEq Oral BID  . senna-docusate  1 tablet Oral BID  . sertraline  50 mg Oral Daily   Continuous Infusions: . sodium chloride 100 mL/hr at 08/06/16 0918     LOS: 5 days    Time spent: 25 minutes. Greater than 50% of this time was spent in direct contact with the patient coordinating care.     Lelon Frohlich, MD [facility] Pager [phone]  If 7PM-7AM, please contact night-coverage [email or web address] Password [facility] 08/06/2016, 2:56 PM

## 2016-08-07 DIAGNOSIS — D509 Iron deficiency anemia, unspecified: Secondary | ICD-10-CM

## 2016-08-07 LAB — GLUCOSE, CAPILLARY
GLUCOSE-CAPILLARY: 104 mg/dL — AB (ref 65–99)
GLUCOSE-CAPILLARY: 127 mg/dL — AB (ref 65–99)
GLUCOSE-CAPILLARY: 133 mg/dL — AB (ref 65–99)
GLUCOSE-CAPILLARY: 137 mg/dL — AB (ref 65–99)
GLUCOSE-CAPILLARY: 171 mg/dL — AB (ref 65–99)
Glucose-Capillary: 114 mg/dL — ABNORMAL HIGH (ref 65–99)
Glucose-Capillary: 123 mg/dL — ABNORMAL HIGH (ref 65–99)

## 2016-08-07 LAB — BASIC METABOLIC PANEL
ANION GAP: 12 (ref 5–15)
BUN: 14 mg/dL (ref 6–20)
CO2: 24 mmol/L (ref 22–32)
Calcium: 8.7 mg/dL — ABNORMAL LOW (ref 8.9–10.3)
Chloride: 99 mmol/L — ABNORMAL LOW (ref 101–111)
Creatinine, Ser: 1.09 mg/dL (ref 0.61–1.24)
GFR calc Af Amer: >60 mL/min (ref >60)
GLUCOSE: 151 mg/dL — AB (ref 65–99)
POTASSIUM: 4.1 mmol/L (ref 3.5–5.1)
SODIUM: 135 mmol/L (ref 135–145)

## 2016-08-07 LAB — CBC
HEMATOCRIT: 36.3 % — AB (ref 39.0–52.0)
HEMOGLOBIN: 12.2 g/dL — AB (ref 13.0–17.0)
MCH: 29.5 pg (ref 26.0–34.0)
MCHC: 33.6 g/dL (ref 30.0–36.0)
MCV: 87.7 fL (ref 78.0–100.0)
Platelets: 313 10*3/uL (ref 150–400)
RBC: 4.14 MIL/uL — ABNORMAL LOW (ref 4.22–5.81)
RDW: 13.5 % (ref 11.5–15.5)
WBC: 10.4 10*3/uL (ref 4.0–10.5)

## 2016-08-07 MED ORDER — METOPROLOL TARTRATE 5 MG/5ML IV SOLN: 5.0000 mg | Freq: Four times a day (QID) | INTRAVENOUS | Status: DC | PRN

## 2016-08-07 MED ORDER — METOPROLOL TARTRATE 50 MG PO TABS
50.0000 mg | ORAL_TABLET | Freq: Every day | ORAL | Status: DC
  Administered 2016-08-08: 50 mg via ORAL
  Filled 2016-08-07: qty 1

## 2016-08-07 MED ORDER — SODIUM CHLORIDE 0.9 % IV SOLN
INTRAVENOUS | Status: DC
  Administered 2016-08-07 (×2): via INTRAVENOUS

## 2016-08-07 NOTE — Progress Notes (Signed)
Urology Inpatient Progress Report  Rectal bleeding [K62.5] Abnormal CT of the abdomen [R93.5] Right ureteral stone [N20.1]  Procedure(s): CYSTOSCOPY WITH RIGHT  RETROGRADE PYELOGRAM, URETEROSCOPY URETERAL REIMPLANT, right boari flap, right psoas hitch  7 Days Post-Op   Intv/Subj: Bowel movements x 2 Slow to move around He is complaining abdominal pain Denies nausea  Active Problems:   Microcytic anemia   Benign hypertension   Rectal bleeding   Uncontrolled type 2 diabetes mellitus with hyperglycemia ([place])   Urolithiasis   Nephrolithiasis   Ureteral trauma, subsequent encounter  Current Facility-Administered Medications  Medication Dose Route Frequency Provider Last Rate Last Dose  . 0.45 % sodium chloride infusion   Intravenous Continuous [name], MD 100 mL/hr at 08/07/16 0415    . acetaminophen (TYLENOL) tablet 1,000 mg  1,000 mg Oral Q8H Alexis Frock, MD   1,000 mg at 08/07/16 0422  . amLODipine (NORVASC) tablet 5 mg  5 mg Oral Daily [name], MD   5 mg at 08/06/16 1015  . aspirin EC tablet 81 mg  81 mg Oral Daily [name], MD   81 mg at 08/06/16 1015  . bisacodyl (DULCOLAX) suppository 10 mg  10 mg Rectal BID [name], MD   10 mg at 08/06/16 1015  . heparin injection 5,000 Units  5,000 Units Subcutaneous Q8H [name], MD   5,000 Units at 08/07/16 0426  . hydrALAZINE (APRESOLINE) injection 10 mg  10 mg Intravenous Q4H PRN Rise Patience, MD      . losartan (COZAAR) tablet 100 mg  100 mg Oral Daily [name], MD   100 mg at 08/06/16 1015   And  . hydrochlorothiazide (HYDRODIURIL) tablet 25 mg  25 mg Oral Daily [name], MD   25 mg at 08/06/16 1015  . insulin aspart (novoLOG) injection 0-15 Units  0-15 Units Subcutaneous Q4H Annita Brod, MD   3 Units at 08/06/16 2126  . insulin glargine (LANTUS) injection 12 Units  12 Units Subcutaneous QHS [name], MD   12 Units at  08/06/16 2204  . LORazepam (ATIVAN) tablet 0.5 mg  0.5 mg Oral QHS PRN [name], MD   0.5 mg at 08/01/16 1954  . metoprolol tartrate (LOPRESSOR) tablet 50 mg  50 mg Oral Daily [name], MD   50 mg at 08/06/16 1015  . naloxone [facility]) injection 0.4 mg  0.4 mg Intravenous PRN [name], MD       And  . sodium chloride flush (NS) 0.9 % injection 9 mL  9 mL Intravenous PRN [name], MD      . ondansetron [facility]) tablet 4 mg  4 mg Oral Q6H PRN Rise Patience, MD       Or  . ondansetron [facility]) injection 4 mg  4 mg Intravenous Q6H PRN Rise Patience, MD   4 mg at 07/31/16 2312  . ondansetron (ZOFRAN) injection 4 mg  4 mg Intravenous Q6H PRN [name], MD   4 mg at 08/04/16 0855  . oxyCODONE (Oxy IR/ROXICODONE) immediate release tablet 5-15 mg  5-15 mg Oral Q4H PRN [name], MD   5 mg at 08/07/16 0422  . polyethylene glycol (MIRALAX / GLYCOLAX) packet 34 g  34 g Oral Daily [name], MD      . senna-docusate (Senokot-S) tablet 1 tablet  1 tablet Oral BID Alexis Frock, MD   1 tablet at 08/06/16  2204  . sertraline (ZOLOFT) tablet 50 mg  50 mg Oral Daily [name], MD   50 mg at 08/06/16 1015     Objective: Vital: Vitals:   08/05/16 2048 08/06/16 0450 08/06/16 2055 08/07/16 0416  BP: 121/62 122/70 136/69 (!) 155/79  Pulse: 76 79 63 72  Resp: 17 18 17 16   Temp: 97.5 F (36.4 C) 97.5 F (36.4 C) 98 F (36.7 C) 98.3 F (36.8 C)  TempSrc: Oral Oral Oral Oral  SpO2: 90% 90% 95% 95%  Weight:  92.6 kg (204 lb 2.3 oz)  92.6 kg (204 lb 2.3 oz)  Height:       I/Os: I/O last 3 completed shifts: In: 21 [P.O.:380; I.V.:2070] Out: 1575 [Urine:1350; Drains:225]  Physical Exam:  General: Patient is in no apparent distress Lungs: Normal respiratory effort, chest expands symmetrically. GI: c/d/i. Abdomen is soft, appropriately tender JP drain with serosanguinous drainage Foley: straw colored Ext:  lower extremities symmetric  Lab Results:  Recent Labs  08/06/16 0502  WBC 8.8  HGB 11.7*  HCT 35.8*    Recent Labs  08/06/16 0502  NA 138  K 3.4*  CL 101  CO2 27  GLUCOSE 132*  BUN 17  CREATININE 1.41*  CALCIUM 8.8*   No results for input(s): LABPT, INR in the last 72 hours. No results for input(s): LABURIN in the last 72 hours. Results for orders placed or performed during the hospital encounter of 07/31/16  Surgical PCR screen     Status: None   Collection Time: 07/31/16  1:25 PM  Result Value Ref Range Status   MRSA, PCR NEGATIVE NEGATIVE Final   Staphylococcus aureus NEGATIVE NEGATIVE Final    Comment:        The Xpert SA Assay (FDA approved for NASAL specimens in patients over 42 years of age), is one component of a comprehensive surveillance program.  Test performance has been validated by [name] for patients greater than or equal to 56 year old. It is not intended to diagnose infection nor to guide or monitor treatment.     Studies/Results: No results found.  Assessment: Procedure(s): CYSTOSCOPY WITH RIGHT  RETROGRADE PYELOGRAM, URETEROSCOPY URETERAL REIMPLANT, right boari flap, right psoas hitch, 7 Days Post-Op  doing well.  JP drain demonstrates on-going urinary tract leak, will need to stay in for short term, likely to be discharged with drain.  Progressing towards discharge.  Plan: Continue IVF Double miralax Oxycodone PRN Continue with aggressive PT Home with Foley and JP drain. Appreciate the help with chronic medical issues from hospitalist. Plan for discharge tomorrow.   Louis Meckel, MD Urology 08/07/2016, 7:14 AM

## 2016-08-07 NOTE — Progress Notes (Signed)
[facility] PROGRESS NOTE    Progress Note  [name]  XTG:[id number] DOB: 10-20-1955 DOA: 07/31/2016 PCP: [name]     Brief Narrative:   [name] is an 61 y.o. male admitted to the hospital on 516 due to rectal bleeding, CT scan showed right-sided hydronephrosis and 3 ureteral stones, seen by urology who performed a ureteroscopy with initial plans for stent placement, however ended up requiring emergent exploratory surgery and ureteral reconstruction for avulsed ureter at time of ureteroscopy  Assessment/Plan:   Rectal bleeding: No further bleeding since admission his hemoglobin has remained stable.  Nephrolithiasis with right-sided hydronephrosis: Status post repair to the recent trauma. Pain regimen management per urology. We'll continue MiraLAX to avoid constipation. Per urology Foley needs to remain in place for 2 weeks Will go home with a JP drain. Change fluids to normal saline and sodium and chloride is trending down  Essential hypertension: Good control continue beta blocker, ARB and HCTZ.  Diabetes mellitus type 2: Good control continue sliding scale insulin plus long-acting.  Essential hypertension: Good control continue current regimen. Next  Diabetes mellitus type 2: Fair control, continue to titrate as needed.    DVT prophylaxis: lovenox Family Communication:none Disposition Plan/Barrier to D/C: home in am Code Status:     Code Status Orders        Start     Ordered   08/01/16 0156  Full code  Continuous     08/01/16 0155    Code Status History    Date Active Date Inactive Code Status Order ID Comments User Context   07/31/2016  6:27 AM 08/01/2016  1:55 AM Full Code [id number]  Rise Patience, MD ED   03/22/2016 10:13 PM 03/26/2016  6:39 PM Full Code [id number]  Newt Minion, MD Inpatient    Advance Directive Documentation     Most Recent Value  Type of Advance Directive  Out of facility DNR (pink MOST or yellow form)    Pre-existing out of facility DNR order (yellow form or pink MOST form)  Pink MOST form placed in chart (order not valid for inpatient use)  "MOST" Form in Place?  -        IV Access:    Peripheral IV   Procedures and diagnostic studies:   No results found.   Medical Consultants:    None.  Anti-Infectives:  None  Subjective:    [name] no new complaints he relates his abdominal pain is improved.  Objective:    Vitals:   08/05/16 2048 08/06/16 0450 08/06/16 2055 08/07/16 0416  BP: 121/62 122/70 136/69 (!) 155/79  Pulse: 76 79 63 72  Resp: 17 18 17 16   Temp: 97.5 F (36.4 C) 97.5 F (36.4 C) 98 F (36.7 C) 98.3 F (36.8 C)  TempSrc: Oral Oral Oral Oral  SpO2: 90% 90% 95% 95%  Weight:  92.6 kg (204 lb 2.3 oz)  92.6 kg (204 lb 2.3 oz)  Height:        Intake/Output Summary (Last 24 hours) at 08/07/16 0924 Last data filed at 08/07/16 0600  Gross per 24 hour  Intake             2390 ml  Output             1125 ml  Net             1265 ml   Filed Weights   08/05/16 0425 08/06/16 0450 08/07/16 0416  Weight: 98.3  kg (216 lb 11.4 oz) 92.6 kg (204 lb 2.3 oz) 92.6 kg (204 lb 2.3 oz)    Exam: General exam: In no acute distress Respiratory system: good Air movement clear to auscultation. Cardiovascular system: Rate and rhythm with positive S1-S2 Gastrointestinal system: Soft nontender positive bowel sounds Central nervous system: Weekly and oriented 3 Extremities: No pedal edema Skin: No rashes or ulcerations.   Data Reviewed:    Labs: Basic Metabolic Panel:  Recent Labs Lab 07/31/16 1154 08/01/16 0324 08/02/16 0551 08/03/16 0536 08/06/16 0502  NA 138 137 139 136 138  K 3.8 4.4 3.3* 4.4 3.4*  CL 105 104 106 100* 101  CO2 25 19* 25 30 27   GLUCOSE 148* 315* 148* 165* 132*  BUN 20 17 12 13 17   CREATININE 1.37* 1.40* 0.99 1.24 1.41*  CALCIUM 8.8* 8.3* 7.6* 8.1* 8.8*   GFR Estimated Creatinine Clearance: 57.9 mL/min (A) (by C-G formula  based on SCr of 1.41 mg/dL (H)). Liver Function Tests: No results for input(s): AST, ALT, ALKPHOS, BILITOT, PROT, ALBUMIN in the last 168 hours. No results for input(s): LIPASE, AMYLASE in the last 168 hours. No results for input(s): AMMONIA in the last 168 hours. Coagulation profile No results for input(s): INR, PROTIME in the last 168 hours.  CBC:  Recent Labs Lab 07/31/16 1154 08/01/16 0324 08/02/16 0551 08/03/16 0536 08/06/16 0502  WBC 9.1 18.2* 10.0 10.1 8.8  HGB 13.6 14.0 11.9* 11.5* 11.7*  HCT 41.2 42.4 37.8* 37.2* 35.8*  MCV 86.4 87.8 91.5 92.3 89.9  PLT 249 251 236 232 326   Cardiac Enzymes: No results for input(s): CKTOTAL, CKMB, CKMBINDEX, TROPONINI in the last 168 hours. BNP (last 3 results) No results for input(s): PROBNP in the last 8760 hours. CBG:  Recent Labs Lab 08/06/16 1706 08/06/16 2008 08/07/16 0003 08/07/16 0331 08/07/16 0741  GLUCAP 198* 167* 114* 104* 123*   D-Dimer: No results for input(s): DDIMER in the last 72 hours. Hgb A1c: No results for input(s): HGBA1C in the last 72 hours. Lipid Profile: No results for input(s): CHOL, HDL, LDLCALC, TRIG, CHOLHDL, LDLDIRECT in the last 72 hours. Thyroid function studies: No results for input(s): TSH, T4TOTAL, T3FREE, THYROIDAB in the last 72 hours.  Invalid input(s): FREET3 Anemia work up: No results for input(s): VITAMINB12, FOLATE, FERRITIN, TIBC, IRON, RETICCTPCT in the last 72 hours. Sepsis Labs:  Recent Labs Lab 08/01/16 0324 08/02/16 0551 08/03/16 0536 08/06/16 0502  WBC 18.2* 10.0 10.1 8.8   Microbiology Recent Results (from the past 240 hour(s))  Surgical PCR screen     Status: None   Collection Time: 07/31/16  1:25 PM  Result Value Ref Range Status   MRSA, PCR NEGATIVE NEGATIVE Final   Staphylococcus aureus NEGATIVE NEGATIVE Final    Comment:        The Xpert SA Assay (FDA approved for NASAL specimens in patients over 18 years of age), is one component of a comprehensive  surveillance program.  Test performance has been validated by [facility] for patients greater than or equal to 26 year old. It is not intended to diagnose infection nor to guide or monitor treatment.      Medications:   . acetaminophen  1,000 mg Oral Q8H  . amLODipine  5 mg Oral Daily  . aspirin EC  81 mg Oral Daily  . bisacodyl  10 mg Rectal BID  . heparin  5,000 Units Subcutaneous Q8H  . losartan  100 mg Oral Daily   And  .  hydrochlorothiazide  25 mg Oral Daily  . insulin aspart  0-15 Units Subcutaneous Q4H  . insulin glargine  12 Units Subcutaneous QHS  . metoprolol tartrate  50 mg Oral Daily  . polyethylene glycol  34 g Oral Daily  . senna-docusate  1 tablet Oral BID  . sertraline  50 mg Oral Daily   Continuous Infusions: . sodium chloride 100 mL/hr at 08/07/16 0415    Time spent: 25 min   LOS: 6 days   Charlynne Cousins  [facility] Pager [phone]  *Please refer to [email or web address], password [email or web address] to get updated schedule on who will round on this patient, as hospitalists switch teams weekly. If 7PM-7AM, please contact night-coverage at [email or web address], password [email or web address] for any overnight needs.  08/07/2016, 9:24 AM

## 2016-08-07 NOTE — Progress Notes (Signed)
PT Cancellation Note  Patient Details Name: [name] MRN: [id number] DOB: [birth year]   Cancelled Treatment:     pt declined any OOB activity.  Pt plans to return to SNF.   Rica Koyanagi  PTA [facility]  Acute  Rehab Pager      [phone]

## 2016-08-08 DIAGNOSIS — S82244S Nondisplaced spiral fracture of shaft of right tibia, sequela: Secondary | ICD-10-CM | POA: Diagnosis not present

## 2016-08-08 DIAGNOSIS — N139 Obstructive and reflux uropathy, unspecified: Secondary | ICD-10-CM | POA: Diagnosis not present

## 2016-08-08 DIAGNOSIS — S3710XA Unspecified injury of ureter, initial encounter: Secondary | ICD-10-CM | POA: Diagnosis not present

## 2016-08-08 DIAGNOSIS — E1169 Type 2 diabetes mellitus with other specified complication: Secondary | ICD-10-CM | POA: Diagnosis not present

## 2016-08-08 DIAGNOSIS — F418 Other specified anxiety disorders: Secondary | ICD-10-CM | POA: Diagnosis not present

## 2016-08-08 DIAGNOSIS — F419 Anxiety disorder, unspecified: Secondary | ICD-10-CM | POA: Diagnosis not present

## 2016-08-08 DIAGNOSIS — E785 Hyperlipidemia, unspecified: Secondary | ICD-10-CM | POA: Diagnosis not present

## 2016-08-08 DIAGNOSIS — R112 Nausea with vomiting, unspecified: Secondary | ICD-10-CM | POA: Diagnosis not present

## 2016-08-08 DIAGNOSIS — N3000 Acute cystitis without hematuria: Secondary | ICD-10-CM | POA: Diagnosis not present

## 2016-08-08 DIAGNOSIS — R509 Fever, unspecified: Secondary | ICD-10-CM | POA: Diagnosis not present

## 2016-08-08 DIAGNOSIS — F411 Generalized anxiety disorder: Secondary | ICD-10-CM | POA: Diagnosis not present

## 2016-08-08 DIAGNOSIS — R2689 Other abnormalities of gait and mobility: Secondary | ICD-10-CM | POA: Diagnosis not present

## 2016-08-08 DIAGNOSIS — N200 Calculus of kidney: Secondary | ICD-10-CM | POA: Diagnosis not present

## 2016-08-08 DIAGNOSIS — N390 Urinary tract infection, site not specified: Secondary | ICD-10-CM | POA: Diagnosis not present

## 2016-08-08 DIAGNOSIS — E119 Type 2 diabetes mellitus without complications: Secondary | ICD-10-CM | POA: Diagnosis not present

## 2016-08-08 DIAGNOSIS — E1165 Type 2 diabetes mellitus with hyperglycemia: Secondary | ICD-10-CM | POA: Diagnosis not present

## 2016-08-08 DIAGNOSIS — F329 Major depressive disorder, single episode, unspecified: Secondary | ICD-10-CM | POA: Diagnosis not present

## 2016-08-08 DIAGNOSIS — S82255D Nondisplaced comminuted fracture of shaft of left tibia, subsequent encounter for closed fracture with routine healing: Secondary | ICD-10-CM | POA: Diagnosis not present

## 2016-08-08 DIAGNOSIS — L03311 Cellulitis of abdominal wall: Secondary | ICD-10-CM | POA: Diagnosis not present

## 2016-08-08 DIAGNOSIS — C61 Malignant neoplasm of prostate: Secondary | ICD-10-CM | POA: Diagnosis not present

## 2016-08-08 DIAGNOSIS — K625 Hemorrhage of anus and rectum: Secondary | ICD-10-CM | POA: Diagnosis not present

## 2016-08-08 DIAGNOSIS — D509 Iron deficiency anemia, unspecified: Secondary | ICD-10-CM | POA: Diagnosis not present

## 2016-08-08 DIAGNOSIS — I10 Essential (primary) hypertension: Secondary | ICD-10-CM | POA: Diagnosis not present

## 2016-08-08 DIAGNOSIS — S3710XD Unspecified injury of ureter, subsequent encounter: Secondary | ICD-10-CM | POA: Diagnosis not present

## 2016-08-08 DIAGNOSIS — N201 Calculus of ureter: Secondary | ICD-10-CM | POA: Diagnosis not present

## 2016-08-08 DIAGNOSIS — Z8612 Personal history of poliomyelitis: Secondary | ICD-10-CM | POA: Diagnosis not present

## 2016-08-08 LAB — GLUCOSE, CAPILLARY
GLUCOSE-CAPILLARY: 118 mg/dL — AB (ref 65–99)
Glucose-Capillary: 126 mg/dL — ABNORMAL HIGH (ref 65–99)
Glucose-Capillary: 169 mg/dL — ABNORMAL HIGH (ref 65–99)

## 2016-08-08 MED ORDER — ACETAMINOPHEN 500 MG PO TABS: 1000.0000 mg | ORAL_TABLET | Freq: Four times a day (QID) | ORAL | 0 refills | Status: DC | PRN

## 2016-08-08 MED ORDER — SENNOSIDES-DOCUSATE SODIUM 8.6-50 MG PO TABS: 1.0000 | ORAL_TABLET | Freq: Every day | ORAL | 99 refills | Status: DC

## 2016-08-08 MED ORDER — HYDROCODONE-ACETAMINOPHEN 5-325 MG PO TABS: ORAL_TABLET | ORAL | 0 refills | Status: DC

## 2016-08-08 MED ORDER — OXYCODONE HCL 5 MG PO TABS: 5.0000 mg | ORAL_TABLET | ORAL | 0 refills | Status: DC | PRN

## 2016-08-08 MED ORDER — POLYETHYLENE GLYCOL 3350 17 G PO PACK: 17.0000 g | PACK | Freq: Every day | ORAL | 99 refills | Status: DC

## 2016-08-08 NOTE — Discharge Instructions (Signed)
JP drain instructions: The drain should be "charged" or kept on suction.  It should be emptied every shift.  The volumes should be recorded daily.  The volume diary will be reviewed at his follow-up appointment and used to assess whether the drain can be removed.   Activity:  You are encouraged to ambulate frequently (about every hour during waking hours) to help prevent blood clots from forming in your legs or lungs.  However, you should not engage in any heavy lifting (> 10-15 lbs), strenuous activity, or straining.   Diet: You should advance your diet as instructed by your physician.  It will be normal to have some bloating, nausea, and abdominal discomfort intermittently.   Prescriptions:  You will be provided a prescription for pain medication to take as needed.  If your pain is not severe enough to require the prescription pain medication, you may take extra strength Tylenol instead which will have less side effects.  You should also take a prescribed stool softener to avoid straining with bowel movements as the prescription pain medication may constipate you.   Incisions: You may remove your dressing bandages 48 hours after surgery if not removed in the hospital.  You will either have some small staples or special tissue glue at each of the incision sites. Once the bandages are removed (if present), the incisions may stay open to air.  You may start showering (but not soaking or bathing in water) the 2nd day after surgery and the incisions simply need to be patted dry after the shower.  No additional care is needed.   What to call us about: You should call the office [phone]) if you develop fever > 101 or develop persistent vomiting. Activity:  You are encouraged to ambulate frequently (about every hour during waking hours) to help prevent blood clots from forming in your legs or lungs.  However, you should not engage in any heavy lifting (> 10-15 lbs), strenuous activity, or  straining.    Foley Catheter Care, Adult A soft, flexible tube (Foley catheter) has been placed in your bladder. This may be done to temporarily help with urine drainage after an operation or to relieve blockage from an enlarged prostate gland. HOME CARE INSTRUCTIONS  If you are going home with a Foley catheter in place, follow these instructions: Taking Care of the Catheter:  Keep the area where the catheter leaves your body clean.  Attach the catheter to the leg so there is no tension on the catheter.  Keep the drainage bag below the level of the bladder, but keep it OFF the floor.  Do not take long soaking baths.   Wash your hands before touching ANYTHING related to the catheter or bag.  Using mild soap and warm water on a washcloth:  Clean the area closest to the catheter insertion site using a circular motion around the catheter.  Clean the catheter itself by wiping AWAY from the insertion site for several inches down the tube.  NEVER wipe upward as this could sweep bacteria up into the urethra (tube in your body that normally drains the bladder) and cause infection. Taking Care of the Drainage Bags:  Two drainage bags will be taken home: a large overnight drainage bag, and a smaller leg bag which fits underneath clothing.  It is okay to wear the overnight bag at any time, but NEVER wear the smaller leg bag at night.  Keep the drainage bag well below the level of your bladder. This  prevents backflow of urine into the bladder and allows the urine to drain freely.  Anchor the tubing to your leg to prevent pulling or tension on the catheter. Use tape or a leg strap provided by the hospital.  Empty the drainage bag when it is  to  full. Wash your hands before and after touching the bag.  Periodically check the tubing for kinks to make sure there is no pressure on the tubing which could restrict the flow of urine. Changing the Drainage Bags:  Cleanse both ends of the clean  bag with alcohol before changing.  Pinch off the rubber catheter to avoid urine spillage during the disconnection.  Disconnect the dirty bag and connect the clean one.  Empty the dirty bag carefully to avoid a urine spill.  Attach the new bag to the leg with tape or a leg strap. Cleaning the Drainage Bags:  Whenever a drainage bag is disconnected, it must be cleaned quickly so it is ready for the next use.  Wash the bag in warm, soapy water.  Rinse the bag thoroughly with warm water.  Soak the bag for 30 minutes in a solution of white vinegar and water (1 cup vinegar to 1 quart warm water).  Rinse with warm water. SEEK MEDICAL CARE IF:   Some pain develops in the kidney (lower back) area.  The urine is cloudy or smells bad.  There is some blood in the urine.  The catheter becomes clogged and/or there is no urine drainage. SEEK IMMEDIATE MEDICAL CARE IF:   You have moderate or severe pain in the kidney region.  You start to throw up (vomit).  Blood fills the tube.  Worsening belly (abdominal) pain develops.  You have a fever. MAKE SURE YOU:   Understand these instructions.  Will watch your condition.  Will get help right away if you are not doing well or get worse.  Call [facility] if you have any questions or concerns: [phone]

## 2016-08-08 NOTE — Progress Notes (Addendum)
Bed alarm sounded, 2 RN's ran to room.  Pt found out of bed sitting on the floor.  Pt states he did not hit his head.  He stated he was going to walk to the sink.  Nero check negative.  Notified MD (Dr. Venetia Constable).  Pt does complain of increased pain to JP site, drain is still in place.  Will notify family and put in A SZP.BP (!) 170/74 (BP Location: Left Arm)   Pulse 74   Temp 97.5 F (36.4 C) (Oral)   Resp 19   Ht 5\' 6"  (1.676 m)   Wt 91.7 kg (202 lb 2.6 oz)   SpO2 97%   BMI 32.63 kg/m    Notified pt sister Shona Simpson.

## 2016-08-08 NOTE — Progress Notes (Signed)
Urology Inpatient Progress Report  Rectal bleeding [K62.5] Abnormal CT of the abdomen [R93.5] Right ureteral stone [N20.1]  Procedure(s): CYSTOSCOPY WITH RIGHT  RETROGRADE PYELOGRAM, URETEROSCOPY URETERAL REIMPLANT, right boari flap, right psoas hitch  8 Days Post-Op   Intv/Subj: Slipped to floor from bedside - no injuries. Tolerating food without nausea. Complaining of abdominal pain and weakness. Several bowel movements in past 24 hours. Slightly confused - not sleeping well.  Active Problems:   Microcytic anemia   Benign hypertension   Rectal bleeding   Uncontrolled type 2 diabetes mellitus with hyperglycemia ([place])   Urolithiasis   Nephrolithiasis   Ureteral trauma, subsequent encounter  Current Facility-Administered Medications  Medication Dose Route Frequency Provider Last Rate Last Dose  . acetaminophen (TYLENOL) tablet 1,000 mg  1,000 mg Oral Q8H Alexis Frock, MD   1,000 mg at 08/08/16 0438  . amLODipine (NORVASC) tablet 5 mg  5 mg Oral Daily [name], MD   5 mg at 08/07/16 9381  . aspirin EC tablet 81 mg  81 mg Oral Daily [name], MD   81 mg at 08/07/16 0175  . bisacodyl (DULCOLAX) suppository 10 mg  10 mg Rectal BID [name], MD   10 mg at 08/07/16 2126  . heparin injection 5,000 Units  5,000 Units Subcutaneous Q8H [name], MD   5,000 Units at 08/08/16 1025  . hydrALAZINE (APRESOLINE) injection 10 mg  10 mg Intravenous Q4H PRN Rise Patience, MD      . losartan (COZAAR) tablet 100 mg  100 mg Oral Daily [name], MD   100 mg at 08/07/16 8527   And  . hydrochlorothiazide (HYDRODIURIL) tablet 25 mg  25 mg Oral Daily [name], MD   25 mg at 08/07/16 7824  . insulin aspart (novoLOG) injection 0-15 Units  0-15 Units Subcutaneous Q4H Annita Brod, MD   2 Units at 08/08/16 0037  . insulin glargine (LANTUS) injection 12 Units  12 Units Subcutaneous QHS [name], MD   12 Units at  08/07/16 2126  . LORazepam (ATIVAN) tablet 0.5 mg  0.5 mg Oral QHS PRN [name], MD   0.5 mg at 08/01/16 1954  . metoprolol tartrate (LOPRESSOR) tablet 50 mg  50 mg Oral Daily Charlynne Cousins, MD      . naloxone [facility]) injection 0.4 mg  0.4 mg Intravenous PRN [name], MD       And  . sodium chloride flush (NS) 0.9 % injection 9 mL  9 mL Intravenous PRN [name], MD      . ondansetron [facility]) tablet 4 mg  4 mg Oral Q6H PRN Rise Patience, MD       Or  . ondansetron [facility]) injection 4 mg  4 mg Intravenous Q6H PRN Rise Patience, MD   4 mg at 07/31/16 2312  . ondansetron (ZOFRAN) injection 4 mg  4 mg Intravenous Q6H PRN [name], MD   4 mg at 08/04/16 0855  . oxyCODONE (Oxy IR/ROXICODONE) immediate release tablet 5-15 mg  5-15 mg Oral Q4H PRN [name], MD   15 mg at 08/08/16 0439  . polyethylene glycol (MIRALAX / GLYCOLAX) packet 34 g  34 g Oral Daily [name], MD   34 g at 08/07/16 1731  . senna-docusate (Senokot-S) tablet 1 tablet  1 tablet Oral BID Alexis Frock, MD   1 tablet at 08/07/16 2125  . sertraline (ZOLOFT) tablet  50 mg  50 mg Oral Daily [name], MD   50 mg at 08/06/16 1015     Objective: Vital: Vitals:   08/07/16 2111 08/08/16 0544 08/08/16 0811 08/08/16 0841  BP: (!) 143/74 131/71 (!) 170/74 (!) 143/64  Pulse: 65 70 74 83  Resp: 18  19 19   Temp: 98.6 F (37 C) 97.6 F (36.4 C) 97.5 F (36.4 C) 99.3 F (37.4 C)  TempSrc: Oral Oral Oral Oral  SpO2: 95% 93% 97% 96%  Weight:  91.7 kg (202 lb 2.6 oz)    Height:       I/Os: I/O last 3 completed shifts: In: 2700 [P.O.:200; I.V.:2500] Out: 3995 [Urine:3850; Drains:145]  Physical Exam:  General: Patient is in no apparent distress Lungs: Normal respiratory effort, chest expands symmetrically. GI: c/d/i. Abdomen is soft, appropriately tender JP drain with serosanguinous drainage Foley: straw colored Ext: lower  extremities symmetric  Lab Results:  Recent Labs  08/06/16 0502 08/07/16 0908  WBC 8.8 10.4  HGB 11.7* 12.2*  HCT 35.8* 36.3*    Recent Labs  08/06/16 0502 08/07/16 0908  NA 138 135  K 3.4* 4.1  CL 101 99*  CO2 27 24  GLUCOSE 132* 151*  BUN 17 14  CREATININE 1.41* 1.09  CALCIUM 8.8* 8.7*   No results for input(s): LABPT, INR in the last 72 hours. No results for input(s): LABURIN in the last 72 hours. Results for orders placed or performed during the hospital encounter of 07/31/16  Surgical PCR screen     Status: None   Collection Time: 07/31/16  1:25 PM  Result Value Ref Range Status   MRSA, PCR NEGATIVE NEGATIVE Final   Staphylococcus aureus NEGATIVE NEGATIVE Final    Comment:        The Xpert SA Assay (FDA approved for NASAL specimens in patients over 63 years of age), is one component of a comprehensive surveillance program.  Test performance has been validated by [facility] for patients greater than or equal to 58 year old. It is not intended to diagnose infection nor to guide or monitor treatment.     Studies/Results: No results found.  Assessment: Procedure(s): CYSTOSCOPY WITH RIGHT  RETROGRADE PYELOGRAM, URETEROSCOPY URETERAL REIMPLANT, right boari flap, right psoas hitch, 8 Days Post-Op  doing well.  JP drain demonstrates on-going urinary tract leak, will need to stay in for short term, likely to be discharged with drain.  Plan: Discharge back to his SNF today.  Will need continued PT and Skilled nursing.    Louis Meckel, MD Urology 08/08/2016, 9:02 AM

## 2016-08-08 NOTE — Progress Notes (Signed)
[facility] PROGRESS NOTE    Progress Note  [name]  NUU:[id number] DOB: 05/08/1955 DOA: 07/31/2016 PCP: [name]     Brief Narrative:   [name] is an 61 y.o. male admitted to the hospital on 516 due to rectal bleeding, CT scan showed right-sided hydronephrosis and 3 ureteral stones, seen by urology who performed a ureteroscopy with initial plans for stent placement, however ended up requiring emergent exploratory surgery and ureteral reconstruction for avulsed ureter at time of ureteroscopy  Assessment/Plan:   Rectal bleeding: Mild streaks of blood in stool, this is likely hemorrhoidal bleeding.  Nephrolithiasis with right-sided hydronephrosis: Further management per urology.  Essential hypertension: Good control continue beta blocker, ARB and HCTZ.  Diabetes mellitus type 2: Good control continue sliding scale insulin plus long-acting.  Essential hypertension: Good control continue current regimen. Next  Diabetes mellitus type 2: Fair control, continue to titrate as needed.    DVT prophylaxis: lovenox Family Communication:none Disposition Plan/Barrier to D/C: home today Code Status:     Code Status Orders        Start     Ordered   08/01/16 0156  Full code  Continuous     08/01/16 0155    Code Status History    Date Active Date Inactive Code Status Order ID Comments User Context   07/31/2016  6:27 AM 08/01/2016  1:55 AM Full Code [id number]  Rise Patience, MD ED   03/22/2016 10:13 PM 03/26/2016  6:39 PM Full Code [id number]  Newt Minion, MD Inpatient    Advance Directive Documentation     Most Recent Value  Type of Advance Directive  Out of facility DNR (pink MOST or yellow form)  Pre-existing out of facility DNR order (yellow form or pink MOST form)  Pink MOST form placed in chart (order not valid for inpatient use)  "MOST" Form in Place?  -        IV Access:    Peripheral IV   Procedures and diagnostic studies:   No  results found.   Medical Consultants:    None.  Anti-Infectives:  None  Subjective:    [name] no new complains he relates his abdominal pain is improved.  Objective:    Vitals:   08/07/16 2111 08/08/16 0544 08/08/16 0811 08/08/16 0841  BP: (!) 143/74 131/71 (!) 170/74 (!) 143/64  Pulse: 65 70 74 83  Resp: 18  19 19   Temp: 98.6 F (37 C) 97.6 F (36.4 C) 97.5 F (36.4 C) 99.3 F (37.4 C)  TempSrc: Oral Oral Oral Oral  SpO2: 95% 93% 97% 96%  Weight:  91.7 kg (202 lb 2.6 oz)    Height:        Intake/Output Summary (Last 24 hours) at 08/08/16 1112 Last data filed at 08/08/16 0942  Gross per 24 hour  Intake             1100 ml  Output             3130 ml  Net            -2030 ml   Filed Weights   08/06/16 0450 08/07/16 0416 08/08/16 0544  Weight: 92.6 kg (204 lb 2.3 oz) 92.6 kg (204 lb 2.3 oz) 91.7 kg (202 lb 2.6 oz)    Exam: General exam: In no acute distress Respiratory system:  good air movement encouraged auscultation Cardiovascular system: Regular rate and rhythm positive S1-S2 Gastrointestinal system: Bowel sounds soft nondistended  Central nervous system: Awake alert and oriented 3 Extremities: No pedal edema.   Data Reviewed:    Labs: Basic Metabolic Panel:  Recent Labs Lab 08/02/16 0551 08/03/16 0536 08/06/16 0502 08/07/16 0908  NA 139 136 138 135  K 3.3* 4.4 3.4* 4.1  CL 106 100* 101 99*  CO2 25 30 27 24   GLUCOSE 148* 165* 132* 151*  BUN 12 13 17 14   CREATININE 0.99 1.24 1.41* 1.09  CALCIUM 7.6* 8.1* 8.8* 8.7*   GFR Estimated Creatinine Clearance: 74.5 mL/min (by C-G formula based on SCr of 1.09 mg/dL). Liver Function Tests: No results for input(s): AST, ALT, ALKPHOS, BILITOT, PROT, ALBUMIN in the last 168 hours. No results for input(s): LIPASE, AMYLASE in the last 168 hours. No results for input(s): AMMONIA in the last 168 hours. Coagulation profile No results for input(s): INR, PROTIME in the last 168  hours.  CBC:  Recent Labs Lab 08/02/16 0551 08/03/16 0536 08/06/16 0502 08/07/16 0908  WBC 10.0 10.1 8.8 10.4  HGB 11.9* 11.5* 11.7* 12.2*  HCT 37.8* 37.2* 35.8* 36.3*  MCV 91.5 92.3 89.9 87.7  PLT 236 232 326 313   Cardiac Enzymes: No results for input(s): CKTOTAL, CKMB, CKMBINDEX, TROPONINI in the last 168 hours. BNP (last 3 results) No results for input(s): PROBNP in the last 8760 hours. CBG:  Recent Labs Lab 08/07/16 1608 08/07/16 2030 08/07/16 2358 08/08/16 0356 08/08/16 0820  GLUCAP 133* 171* 137* 118* 126*   D-Dimer: No results for input(s): DDIMER in the last 72 hours. Hgb A1c: No results for input(s): HGBA1C in the last 72 hours. Lipid Profile: No results for input(s): CHOL, HDL, LDLCALC, TRIG, CHOLHDL, LDLDIRECT in the last 72 hours. Thyroid function studies: No results for input(s): TSH, T4TOTAL, T3FREE, THYROIDAB in the last 72 hours.  Invalid input(s): FREET3 Anemia work up: No results for input(s): VITAMINB12, FOLATE, FERRITIN, TIBC, IRON, RETICCTPCT in the last 72 hours. Sepsis Labs:  Recent Labs Lab 08/02/16 0551 08/03/16 0536 08/06/16 0502 08/07/16 0908  WBC 10.0 10.1 8.8 10.4   Microbiology Recent Results (from the past 240 hour(s))  Surgical PCR screen     Status: None   Collection Time: 07/31/16  1:25 PM  Result Value Ref Range Status   MRSA, PCR NEGATIVE NEGATIVE Final   Staphylococcus aureus NEGATIVE NEGATIVE Final    Comment:        The Xpert SA Assay (FDA approved for NASAL specimens in patients over 39 years of age), is one component of a comprehensive surveillance program.  Test performance has been validated by [facility] for patients greater than or equal to 8 year old. It is not intended to diagnose infection nor to guide or monitor treatment.      Medications:   . acetaminophen  1,000 mg Oral Q8H  . amLODipine  5 mg Oral Daily  . aspirin EC  81 mg Oral Daily  . bisacodyl  10 mg Rectal BID  . heparin   5,000 Units Subcutaneous Q8H  . losartan  100 mg Oral Daily   And  . hydrochlorothiazide  25 mg Oral Daily  . insulin aspart  0-15 Units Subcutaneous Q4H  . insulin glargine  12 Units Subcutaneous QHS  . metoprolol tartrate  50 mg Oral Daily  . polyethylene glycol  34 g Oral Daily  . senna-docusate  1 tablet Oral BID  . sertraline  50 mg Oral Daily   Continuous Infusions:   Time spent: 15 min   LOS: 7 days  Charlynne Cousins  [facility] Pager [phone]  *Please refer to [email or web address], password [email or web address] to get updated schedule on who will round on this patient, as hospitalists switch teams weekly. If 7PM-7AM, please contact night-coverage at [email or web address], password [email or web address] for any overnight needs.  08/08/2016, 11:12 AM

## 2016-08-08 NOTE — Progress Notes (Signed)
LCSW following for discharge planning/disposition:  Pt returning to [place] rehab at [facility].  Patient will transport by [place] completed medical necessity form and called for transportation.   Family notified regarding discharge - pt declines and states he will inform friends/family All information sent to facility by [place] Report number for RN:  [phone]  No other needs at this time   Plan: DC to [place].  Sharren Bridge, MSW, LCSW Clinical Social Work 08/08/2016 [phone]

## 2016-08-08 NOTE — Discharge Summary (Signed)
Date of admission: 07/31/2016  Date of discharge: 08/08/2016  Admission diagnosis:  #1) right obstructing ureteral stones #2) rectal bleeding #3) Diabetes mellitus #4) Constipation  Discharge diagnosis:  As above Right ureteral injury, requiring emergent reconstruction, with stent placement Deconditioning   Secondary diagnoses:  Patient Active Problem List   Diagnosis Date Noted  . Nephrolithiasis 08/01/2016  . Ureteral trauma, subsequent encounter 08/01/2016  . Rectal bleeding 07/31/2016  . Uncontrolled type 2 diabetes mellitus with hyperglycemia ([place]) 07/31/2016  . Urolithiasis 07/31/2016  . Right ureteral stone   . Type 2 diabetes mellitus without complication, without long-term current use of insulin ([place]) 05/29/2016  . Dyslipidemia associated with type 2 diabetes mellitus ([place]) 05/29/2016  . Closed nondisplaced spiral fracture of shaft of right tibia   . Right tibial fracture 03/22/2016  . Microcytic anemia 02/17/2012  . Prostate cancer ([place]) 02/17/2012  . 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 Benign hypertension 02/17/2012  . Depressive disorder 02/17/2012    Procedures performed: Procedure(s): CYSTOSCOPY WITH RIGHT  RETROGRADE PYELOGRAM, URETEROSCOPY URETERAL REIMPLANT, right boari flap, right psoas hitch, right ureteral stent placement  History and Physical: For full details, please see admission history and physical. Briefly, [name] is a 61 y.o. year old patient with obstructing right ureteral stones and some minor rectal bleeding.  He was taking to the operating room for ureteroscopy and stone removal and intraoperatively had a ureteral avulsion injury.  He subsequently had emergent reconstruction.   Hospital Course: The patient was admitted to the hospital from the ED.  Serial hemoglobins were done initially ensuring that his rectal bleeding was clinically insignificant.  Late on HD#1 we was taken to the OR for removal of his stones.  The surgery was  complicated by a ureteral injury and the ureter ureter was subsequently reconstructed emergently.  Patient tolerated the procedure well.  He was then transferred to the floor after an uneventful PACU stay.  His hospital course was uncomplicated, albeit slow.  He was slow to start moving and had a mild post-operative ileus.  His recovery was otherwise uneventful.    On POD# 8 he had met discharge criteria: was eating a regular diet, was up and ambulating with assistance,  pain was well controlled on oral pain medications, and he was deemed appropriate for return back to his skilled nursing facility.  He refused to work with PT the last several days in the hospital but was up and walking around his room with the assistance of the nursing staff. He was eating a carb modified diet without significant nausea/vomitting.  His blood glucose was well controlled with his home regimen. His blood pressure remained WNL and did not require adjustments. He was started Miralax and Senna for his constipation issues.  He was having daily bowel movements at the time of discharge.  He is being discharged with a foley catheter and JP drain.  The foley catheter will likely be removed at his first follow-up visit in one week along with his staples.  His JP drain will likely be removed sometime after the catheter.  Instructions for drain care have been provided.   Laboratory values:   Recent Labs  08/06/16 0502 08/07/16 0908  WBC 8.8 10.4  HGB 11.7* 12.2*  HCT 35.8* 36.3*    Recent Labs  08/06/16 0502 08/07/16 0908  NA 138 135  K 3.4* 4.1  CL 101 99*  CO2 27 24  GLUCOSE 132* 151*  BUN 17 14  CREATININE 1.41* 1.09  CALCIUM  8.8* 8.7*   No results for input(s): LABPT, INR in the last 72 hours. No results for input(s): LABURIN in the last 72 hours. Results for orders placed or performed during the hospital encounter of 07/31/16  Surgical PCR screen     Status: None   Collection Time: 07/31/16  1:25 PM   Result Value Ref Range Status   MRSA, PCR NEGATIVE NEGATIVE Final   Staphylococcus aureus NEGATIVE NEGATIVE Final    Comment:        The Xpert SA Assay (FDA approved for NASAL specimens in patients over 20 years of age), is one component of a comprehensive surveillance program.  Test performance has been validated by [facility] for patients greater than or equal to 50 year old. It is not intended to diagnose infection nor to guide or monitor treatment.     Disposition: Home  Discharge instruction: The patient was instructed to be ambulatory but told to refrain from heavy lifting, strenuous activity, or driving.    Discharge medications:  Allergies as of 08/08/2016   No Known Allergies     Medication List    TAKE these medications   acetaminophen 500 MG tablet Commonly known as:  TYLENOL Take 2 tablets (1,000 mg total) by mouth every 6 (six) hours as needed.   amLODipine 5 MG tablet Commonly known as:  NORVASC Take 5 mg by mouth daily.   aspirin EC 81 MG tablet Take 81 mg by mouth daily.   atorvastatin 20 MG tablet Commonly known as:  LIPITOR Take 20 mg by mouth daily.   HYDROcodone-acetaminophen 5-325 MG tablet Commonly known as:  NORCO/VICODIN Take one to two tablets by mouth every 4 hours as needed for pain What changed:  additional instructions   LORazepam 0.5 MG tablet Commonly known as:  ATIVAN Take 0.5 mg by mouth at bedtime as needed for anxiety.   losartan-hydrochlorothiazide 100-25 MG tablet Commonly known as:  HYZAAR Take 1 tablet by mouth daily.   metFORMIN 1000 MG tablet Commonly known as:  GLUCOPHAGE Take 1,000 mg by mouth 2 (two) times daily with a meal.   metoprolol tartrate 50 MG tablet Commonly known as:  LOPRESSOR Take 50 mg by mouth daily.   multivitamin tablet Take 1 tablet by mouth daily.   oxyCODONE 5 MG immediate release tablet Commonly known as:  Oxy IR/ROXICODONE Take 1 tablet (5 mg total) by mouth every 4 (four) hours  as needed for moderate pain or breakthrough pain.   polyethylene glycol packet Commonly known as:  MIRALAX / GLYCOLAX Take 17 g by mouth daily. Stop taking if having more than 2 soft/loose bowel movements daily.   senna-docusate 8.6-50 MG tablet Commonly known as:  Senokot-S Take 1 tablet by mouth at bedtime.   sertraline 50 MG tablet Commonly known as:  ZOLOFT Take 1 tablet by mouth in the evening for anxiety       Followup:   Contact information for follow-up providers    [name], MD. Go in 1 week(s).   Specialty:  Urology Contact information: [place] [address] [phone]            Contact information for after-discharge care    Destination    [facility] Follow up.   Specialty:  [place] information: [address] [place] [phone]

## 2016-08-09 ENCOUNTER — Other Ambulatory Visit: Payer: Self-pay

## 2016-08-09 ENCOUNTER — Non-Acute Institutional Stay (SKILLED_NURSING_FACILITY): Payer: Medicare Other | Admitting: Adult Health

## 2016-08-09 ENCOUNTER — Encounter: Payer: Self-pay | Admitting: Adult Health

## 2016-08-09 DIAGNOSIS — S82244S Nondisplaced spiral fracture of shaft of right tibia, sequela: Secondary | ICD-10-CM

## 2016-08-09 DIAGNOSIS — E119 Type 2 diabetes mellitus without complications: Secondary | ICD-10-CM

## 2016-08-09 DIAGNOSIS — E1169 Type 2 diabetes mellitus with other specified complication: Secondary | ICD-10-CM | POA: Diagnosis not present

## 2016-08-09 DIAGNOSIS — Z8612 Personal history of poliomyelitis: Secondary | ICD-10-CM

## 2016-08-09 DIAGNOSIS — E785 Hyperlipidemia, unspecified: Secondary | ICD-10-CM | POA: Diagnosis not present

## 2016-08-09 DIAGNOSIS — N201 Calculus of ureter: Secondary | ICD-10-CM | POA: Diagnosis not present

## 2016-08-09 DIAGNOSIS — S3710XD Unspecified injury of ureter, subsequent encounter: Secondary | ICD-10-CM | POA: Diagnosis not present

## 2016-08-09 DIAGNOSIS — I10 Essential (primary) hypertension: Secondary | ICD-10-CM

## 2016-08-09 MED ORDER — HYDROCODONE-ACETAMINOPHEN 5-325 MG PO TABS: ORAL_TABLET | ORAL | 0 refills | Status: DC

## 2016-08-09 NOTE — Telephone Encounter (Signed)
Refill request from [facility]

## 2016-08-09 NOTE — Progress Notes (Signed)
Location:   [place] Room Number: 132 A Place of Service:  SNF (31)   CODE STATUS: Full Code  No Known Allergies  Chief Complaint  Patient presents with  . Hospitalization Follow-up    hospital follow up    HPI:  He had been hospitalized for a right tibia/fibula fracture. He had returned to the hospital for rectal bleeding. He did have a ureteroscopy and stone removal and intraoperatively had a ureteral avulsion injury.  He did have a post ileus. He is here for short term rehab with his goal to return back home.    Past Medical History:  Diagnosis Date  . Depression   . GERD (gastroesophageal reflux disease)   . Hemorrhoids   . Hypertension   . Macular degeneration   . Microcytic anemia   . Prostate cancer ([place]) 08/2009  . Tubular adenoma     Past Surgical History:  Procedure Laterality Date  . CYSTOSCOPY WITH RETROGRADE PYELOGRAM, URETEROSCOPY AND STENT PLACEMENT Right 07/31/2016   Procedure: CYSTOSCOPY WITH RIGHT  RETROGRADE PYELOGRAM, URETEROSCOPY;  Surgeon: [name], MD;  Location: [facility]S;  Service: Urology;  Laterality: Right;  . INGUINAL HERNIA REPAIR    . Left ankle joint fusion  1981  . PROSTATE BIOPSY     x 2  . URETERAL REIMPLANTION  07/31/2016   Procedure: URETERAL REIMPLANT, right boari flap, right psoas hitch;  Surgeon: [name], MD;  Location: [facility];  Service: Urology;;    Social History   Social History  . Marital status: Divorced    Spouse name: N/A  . Number of children: 0  . Years of education: N/A   Occupational History  . Not on file.   Social History Main Topics  . Smoking status: Never Smoker  . Smokeless tobacco: Never Used  . Alcohol use Yes     Comment: Occasional  . Drug use: No  . Sexual activity: Not Currently   Other Topics Concern  . Not on file   Social History Narrative  . No narrative on file   Family History  Problem Relation Age of Onset  . Lung cancer Mother        Deceased   . Throat cancer Brother   . Pancreatic cancer Father   . Heart disease Father        Deceased  . Lung cancer Maternal Uncle        nephew  . Diabetes Other       VITAL SIGNS BP (!) 177/97   Pulse 68   Ht 5\' 6"  (1.676 m)   Wt 202 lb 4.8 oz (91.8 kg)   BMI 32.65 kg/m     Patient's Medications  New Prescriptions   No medications on file  Previous Medications   ACETAMINOPHEN (TYLENOL) 500 MG TABLET    Take 2 tablets (1,000 mg total) by mouth every 6 (six) hours as needed.   AMLODIPINE (NORVASC) 5 MG TABLET    Take 5 mg by mouth daily.   ASPIRIN EC 81 MG TABLET    Take 81 mg by mouth daily.   ATORVASTATIN (LIPITOR) 20 MG TABLET    Take 20 mg by mouth daily.   HYDROCODONE-ACETAMINOPHEN (NORCO/VICODIN) 5-325 MG TABLET    Take one  tablet by mouth every 4 hours as needed for pain   LORAZEPAM (ATIVAN) 0.5 MG TABLET    Take 0.5 mg by mouth at bedtime as needed for anxiety.    LOSARTAN-HYDROCHLOROTHIAZIDE (HYZAAR) 100-25 MG  TABLET    Take 1 tablet by mouth daily.   METFORMIN (GLUCOPHAGE) 1000 MG TABLET    Take 1,000 mg by mouth 2 (two) times daily with a meal.   METOPROLOL (LOPRESSOR) 50 MG TABLET    Take 50 mg by mouth daily.   MULTIPLE VITAMIN (MULTIVITAMIN) TABLET    Take 1 tablet by mouth daily.   OXYCODONE (OXY IR/ROXICODONE) 5 MG IMMEDIATE RELEASE TABLET    Take 1 tablet (5 mg total) by mouth every 4 (four) hours as needed for moderate pain or breakthrough pain.   POLYETHYLENE GLYCOL (MIRALAX / GLYCOLAX) PACKET    Take 17 g by mouth daily. Stop taking if having more than 2 soft/loose bowel movements daily.   SENNA-DOCUSATE (SENOKOT-S) 8.6-50 MG TABLET    Take 1 tablet by mouth at bedtime.  Modified Medications   No medications on file  Discontinued Medications   SERTRALINE (ZOLOFT) 50 MG TABLET    Take 1 tablet by mouth in the evening for anxiety     SIGNIFICANT DIAGNOSTIC EXAMS  03-22-16: right ankle x-ray: Acute distal diaphyseal fractures of the right tibia and fibula as   Above.  03-22-16: right tibia/fibula x-ray: Acute distal diaphyseal fractures of the tibia and fibula.  04-17-16: right tibia/fibula x-ray: Two-view radiographs of the right tibia and fibula show stable alignment  in both AP and lateral planes there is no extension in the lateral view no  shortening in the AP view there is some mild callus formation.  05-22-16: right tibia/fibula x-ray: 2 view radiographs of the right tibia and ankle show some varus  displacement. Patient states that he has been weightbearing. There is also  displacement of the medial malleolar fracture as well. Radiographs do show  calcification of popliteal vessels all the way down to the ankle.  07-31-16: chest x-ray: Right base atelectasis and infiltrate. This is new from 03/22/2016. Findings correlate with CT findings of 07/31/2016.  07-31-16: ct of abdomen and pelvis: renal study: 1. Right hydronephrosis. There are 3 stones in the right mid ureter, 2 adjacent measuring 4 and 3 mm, with a slightly more distal punctate stone. 2. Additional nonobstructing right renal calculi. 3. Hepatic steatosis. 4. Lung base evaluation demonstrates bronchial thickening of the right bronchus intermedius, lower and middle lobes. Associated atelectasis and tree in bud opacities. Findings suggest bronchial filling process, aspiration or mucoid plugging with adjacent postobstructive atelectasis and bronchiolitis. 5. Additional incidental findings include bilateral renal cysts and enlarged prostate gland. Aortic atherosclerosis and coronary artery calcifications.     LABS REVIEWED:   03-22-16: wbc 16.8; hgb 17.9; hct 51.8; mcv 85.2; plt 263; glucose 219; bun 17; creat 1.05; k+ 4.3; na++ 138; hgb a1c 8.2 03-25-16: uric acid 8.0 06-26-16: hgb a1c 7.6  07-31-16: wbc 12.9; hgb 15.8; hct 44.5; mcv 85.2; plt 288; glucose 253; bun 22; creat 1.22; k+ 3.8; na++ 136; ca 9.8; liver normal albumin 4.2; HIV: nr; FOBT +  08-02-16: wbc 10; hgb 1.9; hct 37,8; mcv 91.5;  plt 236; glucose 148; bun 12; creat 0.99; k+ 3.3; na+ +139; na 7.6 08-06-16: wbc 8.8; hgb 11.7 ;hct 35.8; mcv 89.9; plt 326; glucose 132; bun 17; creat 1.41 ;k+ 3.4 ;na++ 138; ca 8.8 08-07-16: wbc 10.4; hgb 12.2; hct 36.3 ;mcv 87.7; plt 313; glucose 151; bun 14; creat 1.09; k+ 4.1 ;na++ 135; ca 8.7    Review of Systems  Constitutional: Negative for malaise/fatigue.  Respiratory: Negative for cough and shortness of breath.   Cardiovascular: Negative for chest  pain, palpitations and leg swelling.  Gastrointestinal: Negative for abdominal pain, constipation and heartburn.  Musculoskeletal:       Has post surgical pain and has soreness present.   Skin: Negative.        Has abdominal incision Has drain  Neurological: Negative for dizziness.  Psychiatric/Behavioral: The patient is not nervous/anxious.      Physical Exam  Constitutional: He is oriented to person, place, and time. He appears well-developed and well-nourished. No distress.  Eyes: Conjunctivae are normal.  Neck: Neck supple. No JVD present. No thyromegaly present.  Cardiovascular: Normal rate, regular rhythm and intact distal pulses.   Respiratory: Effort normal and breath sounds normal. No respiratory distress. He has no wheezes.  GI: Soft. Bowel sounds are normal. He exhibits distension. There is tenderness.  JP drain with bloody drainage  Slight distention present  Has tenderness around incisional area   Genitourinary:  Genitourinary Comments: Has foley   Musculoskeletal: He exhibits no edema.  Able to move all extremities   Lymphadenopathy:    He has no cervical adenopathy.  Neurological: He is alert and oriented to person, place, and time.  Skin: Skin is warm and dry. He is not diaphoretic.  Abdominal incision line staples in place without signs of infection present.   Psychiatric: He has a normal mood and affect.      ASSESSMENT/ PLAN:  1. Hypertension:b/p177/97  will continue lopressor 50 mg daily hyzaar  100/25 mg daily asa 81 mg daily   Will begin norvasc 10 mg daily and will have nursing check blood pressure tqice daily for one week and report  2. Dyslipidemia: will continue lipitor 20 mg daily   3. Diabetes: hgb a1c 87.6; will continue metformin 1 gm twice daily   4. Spiral fracture of shaft of tibia: is followed by ortho. Will continue therapy as directed. Will continue oxycodone 5 mg every 4 hours as needed   5. Depression will continue ativan 0.5 mg nightly as needed for anxiety    6. 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 Constipation: did have a mild post ileus: will continue miralax daily and senna s nightly   8. Right ureteral stone status post ureteral trauma: has JP drain;  He will follow up with urology; will continue oxycodone 5 mg every 4 hours as needed   Will need to follow up with Dr. Louis Meckel (urology) in one week Has appointment with [name] on 08-22-16.  Will stop the vicodin; he as oxycodone   Time spent with patient  50   minutes >50% time spent counseling; reviewing medical record; tests; labs; and developing future plan of care    MD is aware of resident's narcotic use and is in agreement with current plan of care. We will attempt to wean resident as apropriate     Ok Edwards NP [facility]  Contact [phone] Monday through Friday 8am- 5pm  After hours call [phone]

## 2016-08-15 DIAGNOSIS — S3710XA Unspecified injury of ureter, initial encounter: Secondary | ICD-10-CM | POA: Diagnosis not present

## 2016-08-19 ENCOUNTER — Encounter: Payer: Self-pay | Admitting: Internal Medicine

## 2016-08-19 ENCOUNTER — Non-Acute Institutional Stay (SKILLED_NURSING_FACILITY): Payer: Medicare Other | Admitting: Internal Medicine

## 2016-08-19 DIAGNOSIS — S82244S Nondisplaced spiral fracture of shaft of right tibia, sequela: Secondary | ICD-10-CM | POA: Diagnosis not present

## 2016-08-19 DIAGNOSIS — I10 Essential (primary) hypertension: Secondary | ICD-10-CM

## 2016-08-19 DIAGNOSIS — S3710XD Unspecified injury of ureter, subsequent encounter: Secondary | ICD-10-CM

## 2016-08-19 DIAGNOSIS — E1169 Type 2 diabetes mellitus with other specified complication: Secondary | ICD-10-CM | POA: Diagnosis not present

## 2016-08-19 DIAGNOSIS — C61 Malignant neoplasm of prostate: Secondary | ICD-10-CM

## 2016-08-19 DIAGNOSIS — E1165 Type 2 diabetes mellitus with hyperglycemia: Secondary | ICD-10-CM

## 2016-08-19 DIAGNOSIS — F418 Other specified anxiety disorders: Secondary | ICD-10-CM | POA: Diagnosis not present

## 2016-08-19 DIAGNOSIS — E785 Hyperlipidemia, unspecified: Secondary | ICD-10-CM | POA: Diagnosis not present

## 2016-08-19 NOTE — Progress Notes (Signed)
Patient ID: [name], male   DOB: [birth year], 61 y.o.   MRN: [id number]    HISTORY AND PHYSICAL   DATE: 08/19/2016  Location:    [facility] Room Number: 132 A Place of Service: SNF (31)   Extended Emergency Contact Information Primary Emergency Contact: Clonch,Janet  [place] Phone: [phone] Relation: Sister  Advanced Directive information Does Patient Have a Medical Advance Directive?: Yes, Type of Advance Directive: Out of facility DNR (pink MOST or yellow form), Pre-existing out of facility DNR order (yellow form or pink MOST form): Pink MOST form placed in chart (order not valid for inpatient use) (Attempt CPR), Does patient want to make changes to medical advance directive?: No - Patient declined  Chief Complaint  Patient presents with  . Readmit To SNF    Readmission    HPI:  61 yo male seen today as a readmission into SNF following hospital stay for rectal bleeding, urolithiasis, nephrolithiasis, ureteral trauma, microcytic anemia, HTN, DM, depression, hx prostate CA. He presented to the ED with rectal bleeding. CT stone study revealed right hydronephrosis with 3 stones in ureter. Urology consulted for right ureteral stone removal. While in the OR, ureteral stone removal complicated by ureteral injury and he had to have emergent ureteral reconstruction. He experienced mild post op ileus. CXR showed right basilar infiltrate. Pain controlled with OxyIR and norco. Cr peaked 1.41-->1.09; glucose peaked at 315; K dropped to 3.3-->4.1; Hgb dropped to 11.9-->12.2; WBC peaked at 18.2K-->10.4K. He presents to SNF for short term rehab.  Today he reports increased anxiety and difficulty sleeping. He takes lorazepam 0.5mg  qHS. No longer has bladder spasms since catheter removed by Urology last week. He is scheduled to have JP drain pulled this week by urology. Urine stream still split but no dysuria, hematuria or flank pain. He has abdominal discomfort  but controlled with meds. No f/c. No N/V. No nursing concerns.  Hypertension - suboptimally controlled on lopressor 50 mg daily; hyzaar 100/25 mg daily; amlodipine 10mg  daily. He also takes ASA 81 mg daily. meds adjusted 4 days ago     Dyslipidemia - stable on lipitor 20 mg daily   DM - borderline controlled. A1c 7.6%. No low BS reactions. He takes metformin 1 gm twice daily. He has hyperlipidemia and takes statin  Spiral fracture of shaft of right tibia -  followed by ortho. Pain controlled on oxycodone 5 mg every 4 hours prn. He is tolerating therapy  Depression/anxiety - anxiety uncontrolled on ativan 0.5 mg nightly as needed  Post-polio syndrome - stable   Constipation - stable on miralax daily and senna s nightly   Hx prostate CA - followed by urology  Past Medical History:  Diagnosis Date  . Depression   . GERD (gastroesophageal reflux disease)   . Hemorrhoids   . Hypertension   . Macular degeneration   . Microcytic anemia   . Prostate cancer ([place]) 08/2009  . Tubular adenoma     Past Surgical History:  Procedure Laterality Date  . CYSTOSCOPY WITH RETROGRADE PYELOGRAM, URETEROSCOPY AND STENT PLACEMENT Right 07/31/2016   Procedure: CYSTOSCOPY WITH RIGHT  RETROGRADE PYELOGRAM, URETEROSCOPY;  Surgeon: [name], MD;  Location: [facility];  Service: Urology;  Laterality: Right;  . INGUINAL HERNIA REPAIR    . Left ankle joint fusion  1981  . PROSTATE BIOPSY     x 2  . URETERAL REIMPLANTION  07/31/2016   Procedure: URETERAL REIMPLANT, right boari flap, right psoas hitch;  Surgeon: [name], MD;  Location: [facility];  Service: Urology;;    Patient Care Team: [name] as PCP - General (General Practice)  Social History   Social History  . Marital status: Divorced    Spouse name: N/A  . Number of children: 0  . Years of education: N/A   Occupational History  . Not on file.   Social History Main Topics  . Smoking status: Never Smoker  . Smokeless  tobacco: Never Used  . Alcohol use Yes     Comment: Occasional  . Drug use: No  . Sexual activity: Not Currently   Other Topics Concern  . Not on file   Social History Narrative  . No narrative on file     reports that he has never smoked. He has never used smokeless tobacco. He reports that he drinks alcohol. He reports that he does not use drugs.  Family History  Problem Relation Age of Onset  . Lung cancer Mother        Deceased  . Throat cancer Brother   . Pancreatic cancer Father   . Heart disease Father        Deceased  . Lung cancer Maternal Uncle        nephew  . Diabetes Other    Family Status  Relation Status  . Mother (Not Specified)  . Brother (Not Specified)  . Father (Not Specified)  . Mat Uncle (Not Specified)  . Other (Not Specified)    Immunization History  Administered Date(s) Administered  . Influenza Split 12/04/2009  . PPD Test 05/29/2016    No Known Allergies  Medications: Patient's Medications  New Prescriptions   No medications on file  Previous Medications   ACETAMINOPHEN (TYLENOL) 500 MG TABLET    Take 2 tablets (1,000 mg total) by mouth every 6 (six) hours as needed.   AMLODIPINE (NORVASC) 10 MG TABLET    Take 10 mg by mouth daily.    ASPIRIN EC 81 MG TABLET    Take 81 mg by mouth daily.   ATORVASTATIN (LIPITOR) 20 MG TABLET    Take 20 mg by mouth daily.   LORAZEPAM (ATIVAN) 0.5 MG TABLET    Take 0.5 mg by mouth at bedtime.    LOSARTAN-HYDROCHLOROTHIAZIDE (HYZAAR) 100-25 MG TABLET    Take 1 tablet by mouth daily.   METFORMIN (GLUCOPHAGE) 1000 MG TABLET    Take 1,000 mg by mouth 2 (two) times daily with a meal.   METOPROLOL (LOPRESSOR) 50 MG TABLET    Take 50 mg by mouth daily.   MULTIPLE VITAMIN (MULTIVITAMIN) TABLET    Take 1 tablet by mouth daily.   OXYCODONE (OXY IR/ROXICODONE) 5 MG IMMEDIATE RELEASE TABLET    Take 1 tablet (5 mg total) by mouth every 4 (four) hours as needed for moderate pain or breakthrough pain.    POLYETHYLENE GLYCOL (MIRALAX / GLYCOLAX) PACKET    Take 17 g by mouth daily. Stop taking if having more than 2 soft/loose bowel movements daily.   SENNA-DOCUSATE (SENOKOT-S) 8.6-50 MG TABLET    Take 1 tablet by mouth at bedtime.  Modified Medications   No medications on file  Discontinued Medications   HYDROCODONE-ACETAMINOPHEN (NORCO/VICODIN) 5-325 MG TABLET    Take one  tablet by mouth every 4 hours as needed for pain    Review of Systems  Musculoskeletal: Positive for gait problem.  Skin: Positive for wound.  Psychiatric/Behavioral: Positive for sleep disturbance. The patient is nervous/anxious.  All other systems reviewed and are negative.   Vitals:   08/19/16 1157  BP: (!) 166/84  Pulse: 80  Resp: 18  Temp: 98.6 F (37 C)  TempSrc: Oral  SpO2: 98%  Weight: 198 lb (89.8 kg)  Height: 5\' 6"  (1.676 m)   Body mass index is 31.96 kg/m.  Physical Exam  Constitutional: He is oriented to person, place, and time. He appears well-developed and well-nourished.  Sitting up in bed in NAD, looks tired  HENT:  Mouth/Throat: Oropharynx is clear and moist.  Eyes: Pupils are equal, round, and reactive to light. No scleral icterus.  Neck: Neck supple. No JVD present. Carotid bruit is not present. No thyromegaly present.  Cardiovascular: Normal rate, regular rhythm and intact distal pulses.  Exam reveals no gallop and no friction rub.   Murmur (1/6 SEM) heard. Distal REL boot intact. No LLE edema or calf TTP. Distal pulses intact  Pulmonary/Chest: Effort normal and breath sounds normal. He has no wheezes. He has no rales. He exhibits no tenderness.  Abdominal: Soft. Bowel sounds are normal. He exhibits distension. He exhibits no abdominal bruit, no pulsatile midline mass and no mass. There is no hepatomegaly. There is no tenderness. There is no rebound and no guarding.  Obese; midline surgical incision intact without dehiscence, redness, d/c or secondary signs of infection. Min TTP. Steri  strips intact. JP drain intact in RLQ with serosanguinous fluid in bulb. No secondary signs of infection at insertion site of JP drain  Musculoskeletal: He exhibits edema.  RLE distal boot intact with FROM toes and excellent capillary refill.   Lymphadenopathy:    He has no cervical adenopathy.  Neurological: He is alert and oriented to person, place, and time. He has normal reflexes.  Skin: Skin is warm and dry. No rash noted.  Psychiatric: His behavior is normal. Judgment and thought content normal. His mood appears anxious.     Labs reviewed: Admission on 07/31/2016, Discharged on 08/08/2016  No results displayed because visit has over 200 results.  CBC Latest Ref Rng & Units 08/07/2016 08/06/2016 08/03/2016  WBC 4.0 - 10.5 K/uL 10.4 8.8 10.1  Hemoglobin 13.0 - 17.0 g/dL 12.2(L) 11.7(L) 11.5(L)  Hematocrit 39.0 - 52.0 % 36.3(L) 35.8(L) 37.2(L)  Platelets 150 - 400 K/uL 313 326 232   CMP Latest Ref Rng & Units 08/07/2016 08/06/2016 08/03/2016  Glucose 65 - 99 mg/dL 151(H) 132(H) 165(H)  BUN 6 - 20 mg/dL 14 17 13   Creatinine 0.61 - 1.24 mg/dL 1.09 1.41(H) 1.24  Sodium 135 - 145 mmol/L 135 138 136  Potassium 3.5 - 5.1 mmol/L 4.1 3.4(L) 4.4  Chloride 101 - 111 mmol/L 99(L) 101 100(L)  CO2 22 - 32 mmol/L 24 27 30   Calcium 8.9 - 10.3 mg/dL 8.7(L) 8.8(L) 8.1(L)  Total Protein 6.5 - 8.1 g/dL - - -  Total Bilirubin 0.3 - 1.2 mg/dL - - -  Alkaline Phos 38 - 126 U/L - - -  AST 15 - 41 U/L - - -  ALT 17 - 63 U/L - - -     Abstract on 06/28/2016  Component Date Value Ref Range Status  . Hemoglobin A1C 06/26/2016 7.6   Final  Nursing Home on 06/25/2016  Component Date Value Ref Range Status  . Hemoglobin A1C 06/26/2016 7.6   Final    Dg Chest 2 View  Result Date: 07/31/2016 CLINICAL DATA:  Cough.  Abnormal CT. EXAM: CHEST  2 VIEW COMPARISON:  CT 07/31/2016.  Chest x-ray 03/22/2016.  FINDINGS: Mediastinum hilar structures normal. Right base atelectasis and infiltrate. Mild left base  subsegmental atelectasis and or scarring. No significant pleural effusion. No pneumothorax. Cardiomegaly. No pulmonary venous congestion. No acute bony abnormality. IMPRESSION: Right base atelectasis and infiltrate. This is new from 03/22/2016. Findings correlate with CT findings of 07/31/2016. Electronically Signed   By: Marcello Moores  Register   On: 07/31/2016 07:02   Dg C-arm 1-60 Min-no Report  Result Date: 07/31/2016 Fluoroscopy was utilized by the requesting physician.  No radiographic interpretation.   Ct Renal Stone Study  Result Date: 07/31/2016 CLINICAL DATA:  Right lower quadrant pain radiating into right flank. Rectal bleeding tonight. EXAM: CT ABDOMEN AND PELVIS WITHOUT CONTRAST TECHNIQUE: Multidetector CT imaging of the abdomen and pelvis was performed following the standard protocol without IV contrast. COMPARISON:  None. FINDINGS: Lower chest: Bronchial thickening with areas of bronchial filling in the right bronchus intermedius with atelectasis and tree in bud opacities in the right lower and right middle lobes. Heart is normal in size. Coronary artery calcifications. Hepatobiliary: Decreased hepatic density consistent with steatosis. Focal fatty sparing adjacent gallbladder fossa. No evidence of focal lesion. Gallbladder physiologically distended, no calcified stone. No biliary dilatation. Pancreas: Parenchymal atrophy. No ductal dilatation or inflammation. Spleen: Normal in size without focal abnormality. Adrenals/Urinary Tract: Moderate right hydronephrosis. There are 2 obstructing stones in the mid ureter measuring 4 and 3 mm respectively at the level of L3-L4. Aim punctate stone is in the ureter slightly more distally. Additional punctate nonobstructing stone in the lower right kidney. Mild right hydronephrosis. Cyst in the lower pole the right kidney measured approximately 4.1 cm. Probable additional cortical cyst in the upper kidney. No left hydronephrosis. No left urolithiasis. Exophytic  cyst from the lower left kidney measures 5 cm. The adrenal glands are normal. Urinary bladder is physiologically distended, no definite wall thickening. Stomach/Bowel: Stomach is decompressed. No small bowel inflammation, wall thickening or obstruction. The appendix is normal. Small to moderate stool burden throughout the colon. Vascular/Lymphatic: Mild aortic atherosclerosis without aneurysm. Few prominent porta hepatis nodes are likely reactive. Reproductive: Prostate gland is enlarged measuring 5.7 x 5.0 cm causing mass effect on the bladder base. Other: Calcified over density in the central pelvis may be calcified lymph node or sequela of prior fat necrosis. There is fat within both inguinal canals. Tiny fat containing umbilical hernia. Musculoskeletal: There are no acute or suspicious osseous abnormalities. IMPRESSION: 1. Right hydronephrosis. There are 3 stones in the right mid ureter, 2 adjacent measuring 4 and 3 mm, with a slightly more distal punctate stone. 2. Additional nonobstructing right renal calculi. 3. Hepatic steatosis. 4. Lung base evaluation demonstrates bronchial thickening of the right bronchus intermedius, lower and middle lobes. Associated atelectasis and tree in bud opacities. Findings suggest bronchial filling process, aspiration or mucoid plugging with adjacent postobstructive atelectasis and bronchiolitis. 5. Additional incidental findings include bilateral renal cysts and enlarged prostate gland. Aortic atherosclerosis and coronary artery calcifications. Electronically Signed   By: Jeb Levering M.D.   On: 07/31/2016 03:17   Xr Tibia/fibula Right  Result Date: 07/25/2016 2 view radiographs of the right tibia and fibula shows no further displacement there is callus around the fracture site. The fracture lines are less defined showing interval healing.    Assessment/Plan   ICD-10-CM   1. Situational anxiety - moderate exacerbation F41.8   2. Essential hypertension -  suboptimally controlled I10   3. Uncontrolled type 2 diabetes mellitus with hyperglycemia, without long-term current use of insulin (HCC) E11.65  4. Ureteral trauma, subsequent encounter S37.10XD    s/p emergent reconstruction  5. Closed nondisplaced spiral fracture of shaft of right tibia, sequela S82.244S   6. Dyslipidemia associated with type 2 diabetes mellitus (HCC) E11.69    E78.5   7. Prostate cancer ([place]) C61      Increase lorazepam to 1.5 tabs qhs x 7 days then reduce back down to 1 tab qhs to better control anxiety  Cont other meds as ordered  F/u with Ortho as scheduled  Wound care as ordered  JP drain care as indicated  BP and HR check BID x 2 weeks and record  F/u with specialists as scheduled  GOAL: short term rehab and d/c home when medically appropriate. Communicated with pt and nursing.  Will follow  Gola Bribiesca S. Perlie Gold  [facility] [address] [place], [place] [address] [phone] Cell (Monday-Friday 8 AM - 5 PM) [phone] After 5 PM and follow prompts

## 2016-08-21 ENCOUNTER — Encounter: Payer: Self-pay | Admitting: Adult Health

## 2016-08-21 ENCOUNTER — Non-Acute Institutional Stay (SKILLED_NURSING_FACILITY): Payer: Medicare Other | Admitting: Adult Health

## 2016-08-21 DIAGNOSIS — N390 Urinary tract infection, site not specified: Secondary | ICD-10-CM

## 2016-08-21 DIAGNOSIS — R509 Fever, unspecified: Secondary | ICD-10-CM

## 2016-08-21 DIAGNOSIS — L03311 Cellulitis of abdominal wall: Secondary | ICD-10-CM | POA: Diagnosis not present

## 2016-08-21 NOTE — Progress Notes (Signed)
Location:   [facility] Room Number: 132 A Place of Service:  SNF (31)   CODE STATUS: Full Code  No Known Allergies  Chief Complaint  Patient presents with  . Acute Visit    Fever    HPI:  He has a temp of 102. He denies any cough or shortness of breath. He complains of  Dysuria and his JP  site is inflamed. He tells me that he is feeling bad all over. He tells me that his appetite has decreased.   Past Medical History:  Diagnosis Date  . Depression   . GERD (gastroesophageal reflux disease)   . Hemorrhoids   . Hypertension   . Macular degeneration   . Microcytic anemia   . Prostate cancer ([place]) 08/2009  . Tubular adenoma     Past Surgical History:  Procedure Laterality Date  . CYSTOSCOPY WITH RETROGRADE PYELOGRAM, URETEROSCOPY AND STENT PLACEMENT Right 07/31/2016   Procedure: CYSTOSCOPY WITH RIGHT  RETROGRADE PYELOGRAM, URETEROSCOPY;  Surgeon: [name], MD;  Location: [facility];  Service: Urology;  Laterality: Right;  . INGUINAL HERNIA REPAIR    . Left ankle joint fusion  1981  . PROSTATE BIOPSY     x 2  . URETERAL REIMPLANTION  07/31/2016   Procedure: URETERAL REIMPLANT, right boari flap, right psoas hitch;  Surgeon: [name], MD;  Location: [facility];  Service: Urology;;    Social History   Social History  . Marital status: Divorced    Spouse name: N/A  . Number of children: 0  . Years of education: N/A   Occupational History  . Not on file.   Social History Main Topics  . Smoking status: Never Smoker  . Smokeless tobacco: Never Used  . Alcohol use Yes     Comment: Occasional  . Drug use: No  . Sexual activity: Not Currently   Other Topics Concern  . Not on file   Social History Narrative  . No narrative on file   Family History  Problem Relation Age of Onset  . Lung cancer Mother        Deceased  . Throat cancer Brother   . Pancreatic cancer Father   . Heart disease Father        Deceased  . Lung cancer  Maternal Uncle        nephew  . Diabetes Other       VITAL SIGNS BP 138/80   Pulse 82   Temp (!) 102 F (38.9 C)   Resp 17   Ht 5\' 6"  (1.676 m)   Wt 198 lb (89.8 kg)   SpO2 98%   BMI 31.96 kg/m   Patient's Medications  New Prescriptions   No medications on file  Previous Medications   ACETAMINOPHEN (TYLENOL) 500 MG TABLET    Take 500 mg by mouth every 6 (six) hours as needed for mild pain or moderate pain.   AMLODIPINE (NORVASC) 10 MG TABLET    Take 10 mg by mouth daily.    ASPIRIN EC 81 MG TABLET    Take 81 mg by mouth daily.   ATORVASTATIN (LIPITOR) 20 MG TABLET    Take 20 mg by mouth daily.   LORAZEPAM (ATIVAN) 0.5 MG TABLET    Give 1 tablet by mouth every 12 hours as needed and 1.5 tablet by mouth at bedtime for anxiety for 7 days   LOSARTAN-HYDROCHLOROTHIAZIDE (HYZAAR) 100-25 MG TABLET    Take 1 tablet by mouth daily.  METFORMIN (GLUCOPHAGE) 1000 MG TABLET    Take 1,000 mg by mouth 2 (two) times daily with a meal.   METOPROLOL (LOPRESSOR) 50 MG TABLET    Take 50 mg by mouth daily.   MULTIPLE VITAMIN (MULTIVITAMIN) TABLET    Take 1 tablet by mouth daily.   OXYCODONE (OXY IR/ROXICODONE) 5 MG IMMEDIATE RELEASE TABLET    Take 1 tablet (5 mg total) by mouth every 4 (four) hours as needed for moderate pain or breakthrough pain.   POLYETHYLENE GLYCOL (MIRALAX / GLYCOLAX) PACKET    Take 17 g by mouth daily. Stop taking if having more than 2 soft/loose bowel movements daily.   SENNA-DOCUSATE (SENOKOT-S) 8.6-50 MG TABLET    Take 1 tablet by mouth at bedtime.  Modified Medications   No medications on file  Discontinued Medications   ACETAMINOPHEN (TYLENOL) 500 MG TABLET    Take 2 tablets (1,000 mg total) by mouth every 6 (six) hours as needed.     SIGNIFICANT DIAGNOSTIC EXAMS  03-22-16: right ankle x-ray: Acute distal diaphyseal fractures of the right tibia and fibula as  Above.  03-22-16: right tibia/fibula x-ray: Acute distal diaphyseal fractures of the tibia and  fibula.  04-17-16: right tibia/fibula x-ray: Two-view radiographs of the right tibia and fibula show stable alignment  in both AP and lateral planes there is no extension in the lateral view no  shortening in the AP view there is some mild callus formation.  05-22-16: right tibia/fibula x-ray: 2 view radiographs of the right tibia and ankle show some varus  displacement. Patient states that he has been weightbearing. There is also  displacement of the medial malleolar fracture as well. Radiographs do show  calcification of popliteal vessels all the way down to the ankle.  07-31-16: chest x-ray: Right base atelectasis and infiltrate. This is new from 03/22/2016. Findings correlate with CT findings of 07/31/2016.  07-31-16: ct of abdomen and pelvis: renal study: 1. Right hydronephrosis. There are 3 stones in the right mid ureter, 2 adjacent measuring 4 and 3 mm, with a slightly more distal punctate stone. 2. Additional nonobstructing right renal calculi. 3. Hepatic steatosis. 4. Lung base evaluation demonstrates bronchial thickening of the right bronchus intermedius, lower and middle lobes. Associated atelectasis and tree in bud opacities. Findings suggest bronchial filling process, aspiration or mucoid plugging with adjacent postobstructive atelectasis and bronchiolitis. 5. Additional incidental findings include bilateral renal cysts and enlarged prostate gland. Aortic atherosclerosis and coronary artery calcifications.     LABS REVIEWED:   03-22-16: wbc 16.8; hgb 17.9; hct 51.8; mcv 85.2; plt 263; glucose 219; bun 17; creat 1.05; k+ 4.3; na++ 138; hgb a1c 8.2 03-25-16: uric acid 8.0 06-26-16: hgb a1c 7.6  07-31-16: wbc 12.9; hgb 15.8; hct 44.5; mcv 85.2; plt 288; glucose 253; bun 22; creat 1.22; k+ 3.8; na++ 136; ca 9.8; liver normal albumin 4.2; HIV: nr; FOBT +  08-02-16: wbc 10; hgb 1.9; hct 37,8; mcv 91.5; plt 236; glucose 148; bun 12; creat 0.99; k+ 3.3; na+ +139; na 7.6 08-06-16: wbc 8.8; hgb 11.7  ;hct 35.8; mcv 89.9; plt 326; glucose 132; bun 17; creat 1.41 ;k+ 3.4 ;na++ 138; ca 8.8 08-07-16: wbc 10.4; hgb 12.2; hct 36.3 ;mcv 87.7; plt 313; glucose 151; bun 14; creat 1.09; k+ 4.1 ;na++ 135; ca 8.7    Review of Systems  Constitutional: Negative for malaise/fatigue.  Respiratory: Negative for cough and shortness of breath.   Cardiovascular: Negative for chest pain, palpitations and leg swelling.  Gastrointestinal: Negative  for abdominal pain, constipation and heartburn.  Has dysuria and decreased urin output  Musculoskeletal:       Has post surgical pain and has soreness present.   Skin: Negative.        Has abdominal incision Has drain  Neurological: Negative for dizziness.  Psychiatric/Behavioral: The patient is not nervous/anxious.      Physical Exam  Constitutional: He is oriented to person, place, and time. He appears well-developed and well-nourished. No distress.  Eyes: Conjunctivae are normal.  Neck: Neck supple. No JVD present. No thyromegaly present.  Cardiovascular: Normal rate, regular rhythm and intact distal pulses.   Respiratory: Effort normal and breath sounds normal. No respiratory distress. He has no wheezes.  GI: Soft. Bowel sounds are normal. He exhibits distension. There is tenderness.  JP drain with inflammation present.   Slight distention present  Has tenderness around incisional area   Genitourinary:  Genitourinary Comments: Has foley   Musculoskeletal: He exhibits no edema.  Able to move all extremities   Lymphadenopathy:    He has no cervical adenopathy.  Neurological: He is alert and oriented to person, place, and time.  Skin: Skin is warm and dry. He is not diaphoretic.  Abdominal incision line  without signs of infection present.   Psychiatric: He has a normal mood and affect.      ASSESSMENT/ PLAN:  1. Fever 2. UTI 3. JP site inflammation  Will get cbc cmp ua/c&s Will begin keflex 500 mg twice daily for 2 weeks with  florastor Will monitor    MD is aware of resident's narcotic use and is in agreement with current plan of care. We will attempt to wean resident as apropriate     Ok Edwards NP [facility]  Contact [phone] Monday through Friday 8am- 5pm  After hours call [phone]

## 2016-08-22 ENCOUNTER — Ambulatory Visit (INDEPENDENT_AMBULATORY_CARE_PROVIDER_SITE_OTHER): Payer: Medicare Other | Admitting: Orthopedic Surgery

## 2016-08-22 DIAGNOSIS — S3710XA Unspecified injury of ureter, initial encounter: Secondary | ICD-10-CM | POA: Diagnosis not present

## 2016-08-22 DIAGNOSIS — N200 Calculus of kidney: Secondary | ICD-10-CM | POA: Diagnosis not present

## 2016-08-22 LAB — HEPATIC FUNCTION PANEL
ALT: 19 (ref 10–40)
AST: 17 (ref 14–40)
Alkaline Phosphatase: 131 — AB (ref 25–125)
BILIRUBIN, TOTAL: 0.6

## 2016-08-22 LAB — BASIC METABOLIC PANEL
BUN: 20 (ref 4–21)
Creatinine: 1.5 — AB (ref 0.6–1.3)
GLUCOSE: 159
POTASSIUM: 3.7 (ref 3.4–5.3)
SODIUM: 138 (ref 137–147)

## 2016-08-22 LAB — CBC AND DIFFERENTIAL
HEMATOCRIT: 39 — AB (ref 41–53)
HEMOGLOBIN: 12.7 — AB (ref 13.5–17.5)
NEUTROS ABS: 11
Platelets: 361 (ref 150–399)
WBC: 13.3

## 2016-08-23 NOTE — Addendum Note (Signed)
Addendum  created 08/23/16 1241 by Lyn Hollingshead, MD   Sign clinical note

## 2016-08-27 DIAGNOSIS — R2689 Other abnormalities of gait and mobility: Secondary | ICD-10-CM | POA: Diagnosis not present

## 2016-08-28 ENCOUNTER — Non-Acute Institutional Stay (SKILLED_NURSING_FACILITY): Payer: Medicare Other | Admitting: Adult Health

## 2016-08-28 ENCOUNTER — Encounter: Payer: Self-pay | Admitting: Adult Health

## 2016-08-28 DIAGNOSIS — S82244S Nondisplaced spiral fracture of shaft of right tibia, sequela: Secondary | ICD-10-CM | POA: Diagnosis not present

## 2016-08-28 DIAGNOSIS — E119 Type 2 diabetes mellitus without complications: Secondary | ICD-10-CM | POA: Diagnosis not present

## 2016-08-28 DIAGNOSIS — I10 Essential (primary) hypertension: Secondary | ICD-10-CM | POA: Diagnosis not present

## 2016-08-28 DIAGNOSIS — F329 Major depressive disorder, single episode, unspecified: Secondary | ICD-10-CM | POA: Diagnosis not present

## 2016-08-28 DIAGNOSIS — F419 Anxiety disorder, unspecified: Secondary | ICD-10-CM | POA: Diagnosis not present

## 2016-08-28 DIAGNOSIS — E785 Hyperlipidemia, unspecified: Secondary | ICD-10-CM | POA: Diagnosis not present

## 2016-08-28 DIAGNOSIS — E1169 Type 2 diabetes mellitus with other specified complication: Secondary | ICD-10-CM | POA: Diagnosis not present

## 2016-08-28 NOTE — Progress Notes (Signed)
Location:   [facility] Room Number: 132 A Place of Service:  SNF (31)    CODE STATUS: Full Code  No Known Allergies  Chief Complaint  Patient presents with  . Discharge Note    Discharging to Home    HPI:  He is being discharged to home with home health for pt/ot/rn/cna. He will need a standard wheelchair, 3:1 commode, and tub bench. He will need his prescriptions written and will need to follow up with his medical provider.  He was originally hospitalized for a right tibia fracture. He returned to the hospital for renal stone removal with complications requiring surgical intervention. He was admitted to this facility for short term rehab.   Past Medical History:  Diagnosis Date  . Depression   . GERD (gastroesophageal reflux disease)   . Hemorrhoids   . Hypertension   . Macular degeneration   . Microcytic anemia   . Prostate cancer ([place]) 08/2009  . Tubular adenoma     Past Surgical History:  Procedure Laterality Date  . CYSTOSCOPY WITH RETROGRADE PYELOGRAM, URETEROSCOPY AND STENT PLACEMENT Right 07/31/2016   Procedure: CYSTOSCOPY WITH RIGHT  RETROGRADE PYELOGRAM, URETEROSCOPY;  Surgeon: [name], MD;  Location: [facility];  Service: Urology;  Laterality: Right;  . INGUINAL HERNIA REPAIR    . Left ankle joint fusion  1981  . PROSTATE BIOPSY     x 2  . URETERAL REIMPLANTION  07/31/2016   Procedure: URETERAL REIMPLANT, right boari flap, right psoas hitch;  Surgeon: [name], MD;  Location: [facility];  Service: Urology;;    Social History   Social History  . Marital status: Divorced    Spouse name: N/A  . Number of children: 0  . Years of education: N/A   Occupational History  . Not on file.   Social History Main Topics  . Smoking status: Never Smoker  . Smokeless tobacco: Never Used  . Alcohol use Yes     Comment: Occasional  . Drug use: No  . Sexual activity: Not Currently   Other Topics Concern  . Not on file   Social  History Narrative  . No narrative on file   Family History  Problem Relation Age of Onset  . Lung cancer Mother        Deceased  . Throat cancer Brother   . Pancreatic cancer Father   . Heart disease Father        Deceased  . Lung cancer Maternal Uncle        nephew  . Diabetes Other     VITAL SIGNS BP 128/78   Pulse 78   Temp 98.7 F (37.1 C)   Resp 18   Ht 5\' 6"  (1.676 m)   Wt 192 lb 12.8 oz (87.5 kg)   SpO2 95%   BMI 31.12 kg/m   Patient's Medications  New Prescriptions   No medications on file  Previous Medications   ACETAMINOPHEN (TYLENOL) 500 MG TABLET    Take 500 mg by mouth every 6 (six) hours as needed for mild pain or moderate pain.   AMLODIPINE (NORVASC) 10 MG TABLET    Take 10 mg by mouth daily.    ASPIRIN EC 81 MG TABLET    Take 81 mg by mouth daily.   ATORVASTATIN (LIPITOR) 20 MG TABLET    Take 20 mg by mouth daily.   HYDROCODONE-ACETAMINOPHEN (NORCO/VICODIN) 5-325 MG TABLET    Take 1 tablet by mouth every 6 (six) hours  as needed for moderate pain (Per Dr. Brett Fairy).   LEVOFLOXACIN (LEVAQUIN) 500 MG TABLET    Take 500 mg by mouth daily.   LORAZEPAM (ATIVAN) 0.5 MG TABLET    Give 0.5 mg nightly and every 12 hours as needed    LOSARTAN-HYDROCHLOROTHIAZIDE (HYZAAR) 100-25 MG TABLET    Take 1 tablet by mouth daily.   METFORMIN (GLUCOPHAGE) 1000 MG TABLET    Take 1,000 mg by mouth 2 (two) times daily with a meal.   METOPROLOL (LOPRESSOR) 50 MG TABLET    Take 50 mg by mouth daily.   MULTIPLE VITAMIN (MULTIVITAMIN) TABLET    Take 1 tablet by mouth daily.   POLYETHYLENE GLYCOL (MIRALAX / GLYCOLAX) PACKET    Take 17 g by mouth daily. Stop taking if having more than 2 soft/loose bowel movements daily.   PROMETHAZINE (PHENERGAN) 25 MG TABLET    Take 25 mg by mouth every 8 (eight) hours as needed for nausea or vomiting.   SENNA-DOCUSATE (SENOKOT-S) 8.6-50 MG TABLET    Take 1 tablet by mouth at bedtime.   SERTRALINE (ZOLOFT) 25 MG TABLET    Take 25 mg by mouth daily.  Give 1 tablet by mouth daily until 09-04-16 09-04-16 Give 2 (50 mg) tablets by mouth daily  Modified Medications   No medications on file  Discontinued Medications   OXYCODONE (OXY IR/ROXICODONE) 5 MG IMMEDIATE RELEASE TABLET    Take 1 tablet (5 mg total) by mouth every 4 (four) hours as needed for moderate pain or breakthrough pain.     SIGNIFICANT DIAGNOSTIC EXAMS   03-22-16: right ankle x-ray: Acute distal diaphyseal fractures of the right tibia and fibula as  Above.  03-22-16: right tibia/fibula x-ray: Acute distal diaphyseal fractures of the tibia and fibula.  04-17-16: right tibia/fibula x-ray: Two-view radiographs of the right tibia and fibula show stable alignment  in both AP and lateral planes there is no extension in the lateral view no  shortening in the AP view there is some mild callus formation.  05-22-16: right tibia/fibula x-ray: 2 view radiographs of the right tibia and ankle show some varus  displacement. Patient states that he has been weightbearing. There is also  displacement of the medial malleolar fracture as well. Radiographs do show  calcification of popliteal vessels all the way down to the ankle.  07-31-16: chest x-ray: Right base atelectasis and infiltrate. This is new from 03/22/2016. Findings correlate with CT findings of 07/31/2016.  07-31-16: ct of abdomen and pelvis: renal study: 1. Right hydronephrosis. There are 3 stones in the right mid ureter, 2 adjacent measuring 4 and 3 mm, with a slightly more distal punctate stone. 2. Additional nonobstructing right renal calculi. 3. Hepatic steatosis. 4. Lung base evaluation demonstrates bronchial thickening of the right bronchus intermedius, lower and middle lobes. Associated atelectasis and tree in bud opacities. Findings suggest bronchial filling process, aspiration or mucoid plugging with adjacent postobstructive atelectasis and bronchiolitis. 5. Additional incidental findings include bilateral renal cysts and enlarged  prostate gland. Aortic atherosclerosis and coronary artery calcifications.     LABS REVIEWED:   03-22-16: wbc 16.8; hgb 17.9; hct 51.8; mcv 85.2; plt 263; glucose 219; bun 17; creat 1.05; k+ 4.3; na++ 138; hgb a1c 8.2 03-25-16: uric acid 8.0 06-26-16: hgb a1c 7.6  07-31-16: wbc 12.9; hgb 15.8; hct 44.5; mcv 85.2; plt 288; glucose 253; bun 22; creat 1.22; k+ 3.8; na++ 136; ca 9.8; liver normal albumin 4.2; HIV: nr; FOBT +  08-02-16: wbc 10; hgb 1.9;  hct 37,8; mcv 91.5; plt 236; glucose 148; bun 12; creat 0.99; k+ 3.3; na+ +139; na 7.6 08-06-16: wbc 8.8; hgb 11.7 ;hct 35.8; mcv 89.9; plt 326; glucose 132; bun 17; creat 1.41 ;k+ 3.4 ;na++ 138; ca 8.8 08-07-16: wbc 10.4; hgb 12.2; hct 36.3 ;mcv 87.7; plt 313; glucose 151; bun 14; creat 1.09; k+ 4.1 ;na++ 135; ca 8.7    Review of Systems  Constitutional: Negative for malaise/fatigue.  Respiratory: Negative for cough and shortness of breath.   Cardiovascular: Negative for chest pain, palpitations and leg swelling.  Gastrointestinal: Negative for abdominal pain, constipation and heartburn.  Musculoskeletal:       Has post surgical pain and has soreness present.   Skin: Negative.        Has abdominal incisionn neurological: Negative for dizziness.  Psychiatric/Behavioral: The patient is not nervous/anxious.      Physical Exam  Constitutional: He is oriented to person, place, and time. He appears well-developed and well-nourished. No distress.  Eyes: Conjunctivae are normal.  Neck: Neck supple. No JVD present. No thyromegaly present.  Cardiovascular: Normal rate, regular rhythm and intact distal pulses.   Respiratory: Effort normal and breath sounds normal. No respiratory distress. He has no wheezes.  GI: Soft. Bowel sounds are normal. He exhibits distension. There is tenderness.   Slight distention present  Has mild  tenderness around incisional area   Genitourinary:  Genitourinary Comments: foley has been removed Musculoskeletal: He exhibits  no edema.  Able to move all extremities   Lymphadenopathy:    He has no cervical adenopathy.  Neurological: He is alert and oriented to person, place, and time.  Skin: Skin is warm and dry. He is not diaphoretic.  Abdominal incision line  without signs of infection present.   Psychiatric: He has a normal mood and affect.     ASSESSMENT/ PLAN:  Patient is being discharged with the following home health services:  Pt/ot/rn/cna: to evaluate and treat as indicated for gait balance strength adl training medication management and adl care   Patient is being discharged with the following durable medical equipment:  3:1 commode tub bench. He required a standard wheelchair with cushion anti-tippers; leg rests and brake extensions  In order to allow him to maintain his current level of independence with his ald's which cannot be achieved with a walker; he can self propel  Patient has been advised to f/u with their PCP in 1-2 weeks to bring them up to date on their rehab stay.  Social services at facility was responsible for arranging this appointment.  Pt was provided with a 30 day supply of prescriptions for medications and refills must be obtained from their PCP.  For controlled substances, a more limited supply may be provided adequate until PCP appointment only. #30 vicodin 5/325 mg tabs #30 ativan 0.5 mg tabs    Time spent with patient  45  minutes >50% time spent counseling; reviewing medical record; tests; labs; and developing future plan of care   Ok Edwards NP [facility]  Contact [phone] Monday through Friday 8am- 5pm  After hours call [phone]

## 2016-08-29 DIAGNOSIS — R2689 Other abnormalities of gait and mobility: Secondary | ICD-10-CM | POA: Diagnosis not present

## 2016-08-30 ENCOUNTER — Other Ambulatory Visit: Payer: Self-pay

## 2016-08-30 NOTE — Patient Outreach (Signed)
[facility]) Care Management  08/30/2016  [name] January 19, 1956 [id number]     Transition of Care Referral  Referral Date: 08/29/16 Referral Source: [place] [facility] Referral Reason: "needs SW" Date of Admission: unknown Diagnosis:  Right tibia fracture, renal stone removal s/p surgery Date of Discharge: 08/30/16 Facility: [facility]: Medicare & Medicaid    Outreach attempt # 1 to patient. No answer. RN CM left HIPAA compliant message along with contact info.       Plan: RN CM will make outreach attempt to patient within one business day if no return call from patient.   Enzo Montgomery, RN,BSN,CCM [place] Management Telephonic Care Management Coordinator Direct Phone: [phone] Toll Free: [phone] Fax: [phone]

## 2016-08-31 DIAGNOSIS — Z48816 Encounter for surgical aftercare following surgery on the genitourinary system: Secondary | ICD-10-CM | POA: Diagnosis not present

## 2016-08-31 DIAGNOSIS — E1165 Type 2 diabetes mellitus with hyperglycemia: Secondary | ICD-10-CM | POA: Diagnosis not present

## 2016-08-31 DIAGNOSIS — S3710XD Unspecified injury of ureter, subsequent encounter: Secondary | ICD-10-CM | POA: Diagnosis not present

## 2016-08-31 DIAGNOSIS — I2510 Atherosclerotic heart disease of native coronary artery without angina pectoris: Secondary | ICD-10-CM | POA: Diagnosis not present

## 2016-08-31 DIAGNOSIS — S82244D Nondisplaced spiral fracture of shaft of right tibia, subsequent encounter for closed fracture with routine healing: Secondary | ICD-10-CM | POA: Diagnosis not present

## 2016-08-31 DIAGNOSIS — I10 Essential (primary) hypertension: Secondary | ICD-10-CM | POA: Diagnosis not present

## 2016-09-02 ENCOUNTER — Other Ambulatory Visit: Payer: Self-pay

## 2016-09-02 ENCOUNTER — Encounter: Payer: Self-pay | Admitting: *Deleted

## 2016-09-02 ENCOUNTER — Other Ambulatory Visit: Payer: Self-pay | Admitting: *Deleted

## 2016-09-02 NOTE — Patient Outreach (Signed)
[facility]) Care Management  09/02/2016  TRAVEION RUDDOCK 1955-03-29 [id number]   CSW made an initial attempt to try and contact patient today to perform phone assessment, as well as assess and assist with social needs and services, without success.  A HIPAA compliant message was left for patient on voicemail.  CSW is currently awaiting a return call. CSW will make a second outreach attempt within the two weeks, when CSW returns to the office from vacation, if CSW does not receive a return call from patient in the meantime. Nat Christen, BSW, MSW, LCSW  Licensed Education officer, [facility]  Mailing [place] [address], [place], [place] [address] Physical Address-[address], [place], [place] [address] Toll Free Main # [phone] Fax # [phone] Cell # [phone]  Office # [phone] [email or web address]

## 2016-09-02 NOTE — Patient Outreach (Signed)
[facility]) Care Management  09/02/2016  [name] 21-Dec-1955 [id number]    Transition of Care Referral  Referral Date: 08/29/16 Referral Source: [place] [facility] Referral Reason: "needs SW" Date of Admission: unknown Diagnosis:  Right tibia fracture, renal stone removal s/p surgery Date of Discharge: 08/30/16 Facility: [facility]: Medicare & Medicaid   Outreach attempt #2 to patient. Spoke with patient. States he is feeling bad and just "threw up" his breakfast. He requested that RN CM call him back another day.     Plan: RN CM will make outreach attempt to patient within three business days.   Enzo Montgomery, RN,BSN,CCM [place] Management Telephonic Care Management Coordinator Direct Phone: [phone] Toll Free: [phone] Fax: [phone]

## 2016-09-03 ENCOUNTER — Other Ambulatory Visit: Payer: Self-pay

## 2016-09-03 NOTE — Patient Outreach (Signed)
[facility]) Care Management  09/03/2016  TREMON SAINVIL 01-Apr-1955 [id number]   Transition of Care Referral  Referral Date: 08/29/16 Referral Source: [place] [facility] Referral Reason: "needs SW" Date of Admission: unknown Diagnosis: Right tibia fracture, renal stone removal s/p surgery Date of Discharge: 08/30/16 Facility: [facility]: Medicare & Medicaid     Outreach attempt #3 to patient. No answer at present and RN CM unable to leave message.    Plan: RN CM will send unsuccessful outreach letter to patient and close case if no response within 10 business days.   Enzo Montgomery, RN,BSN,CCM [place] Management Telephonic Care Management Coordinator Direct Phone: [phone] Toll Free: [phone] Fax: [phone] '

## 2016-09-04 ENCOUNTER — Other Ambulatory Visit: Payer: Self-pay

## 2016-09-04 DIAGNOSIS — I2510 Atherosclerotic heart disease of native coronary artery without angina pectoris: Secondary | ICD-10-CM | POA: Diagnosis not present

## 2016-09-04 DIAGNOSIS — E1165 Type 2 diabetes mellitus with hyperglycemia: Secondary | ICD-10-CM | POA: Diagnosis not present

## 2016-09-04 DIAGNOSIS — Z48816 Encounter for surgical aftercare following surgery on the genitourinary system: Secondary | ICD-10-CM | POA: Diagnosis not present

## 2016-09-04 DIAGNOSIS — I10 Essential (primary) hypertension: Secondary | ICD-10-CM | POA: Diagnosis not present

## 2016-09-04 DIAGNOSIS — S82244D Nondisplaced spiral fracture of shaft of right tibia, subsequent encounter for closed fracture with routine healing: Secondary | ICD-10-CM | POA: Diagnosis not present

## 2016-09-04 DIAGNOSIS — S3710XD Unspecified injury of ureter, subsequent encounter: Secondary | ICD-10-CM | POA: Diagnosis not present

## 2016-09-04 NOTE — Patient Outreach (Signed)
[facility]) Care Management  09/04/2016  [name] Feb 03, 1956 [id number]   Transition of Care Referral  Referral Date: 08/29/16 Referral Source: [place] [facility] Referral Reason: "needs SW" Date of Admission: unknown Diagnosis: Right tibia fracture, renal stone removal s/p surgery Date of Discharge: 08/30/16 Facility: [facility]: Medicare & Medicaid   Incoming all from staff at [facility]. She advised that she received call from nurse form [facility] who had been out to see patient. She reported that patient was declining and not doing well, BP low and patient barely able to walk. HHRN recommended that patient be readmitted to facility. She states she needs [facility] to fill out FL2 so that they can take patient back. Advised that RN CM along with [facility] SW have both been trying to reach patient without success and patient is currently not enrolled in [facility]. Based on patient's declining status and urgency in getting placed advised her that she should contact PCP office and provided with contact number to MD office.    Plan: RN CM will provide update to assigned [facility] SW via in basket message.   Enzo Montgomery, RN,BSN,CCM [place] Management Telephonic Care Management Coordinator Direct Phone: [phone] Toll Free: [phone] Fax: [phone]

## 2016-09-06 ENCOUNTER — Inpatient Hospital Stay (HOSPITAL_COMMUNITY)
Admission: EM | Admit: 2016-09-06 | Discharge: 2016-09-11 | DRG: 690 | Disposition: A | Discharge status: Skilled Nursing Facility | Payer: Medicare Other | Attending: Urology | Admitting: Urology

## 2016-09-06 ENCOUNTER — Emergency Department (HOSPITAL_COMMUNITY): Payer: Medicare Other

## 2016-09-06 ENCOUNTER — Other Ambulatory Visit: Payer: Self-pay | Admitting: *Deleted

## 2016-09-06 ENCOUNTER — Encounter (HOSPITAL_COMMUNITY): Payer: Self-pay | Admitting: *Deleted

## 2016-09-06 DIAGNOSIS — B952 Enterococcus as the cause of diseases classified elsewhere: Secondary | ICD-10-CM | POA: Diagnosis present

## 2016-09-06 DIAGNOSIS — Z8612 Personal history of poliomyelitis: Secondary | ICD-10-CM | POA: Diagnosis not present

## 2016-09-06 DIAGNOSIS — N3289 Other specified disorders of bladder: Secondary | ICD-10-CM | POA: Diagnosis present

## 2016-09-06 DIAGNOSIS — N12 Tubulo-interstitial nephritis, not specified as acute or chronic: Secondary | ICD-10-CM

## 2016-09-06 DIAGNOSIS — N390 Urinary tract infection, site not specified: Secondary | ICD-10-CM | POA: Diagnosis present

## 2016-09-06 DIAGNOSIS — K219 Gastro-esophageal reflux disease without esophagitis: Secondary | ICD-10-CM | POA: Diagnosis present

## 2016-09-06 DIAGNOSIS — K625 Hemorrhage of anus and rectum: Secondary | ICD-10-CM | POA: Diagnosis not present

## 2016-09-06 DIAGNOSIS — S3710XD Unspecified injury of ureter, subsequent encounter: Secondary | ICD-10-CM | POA: Diagnosis not present

## 2016-09-06 DIAGNOSIS — N139 Obstructive and reflux uropathy, unspecified: Secondary | ICD-10-CM | POA: Diagnosis present

## 2016-09-06 DIAGNOSIS — Z8546 Personal history of malignant neoplasm of prostate: Secondary | ICD-10-CM | POA: Diagnosis not present

## 2016-09-06 DIAGNOSIS — H3530 Unspecified macular degeneration: Secondary | ICD-10-CM | POA: Diagnosis present

## 2016-09-06 DIAGNOSIS — F329 Major depressive disorder, single episode, unspecified: Secondary | ICD-10-CM | POA: Diagnosis not present

## 2016-09-06 DIAGNOSIS — M6281 Muscle weakness (generalized): Secondary | ICD-10-CM | POA: Diagnosis not present

## 2016-09-06 DIAGNOSIS — I10 Essential (primary) hypertension: Secondary | ICD-10-CM | POA: Diagnosis present

## 2016-09-06 DIAGNOSIS — N10 Acute tubulo-interstitial nephritis: Secondary | ICD-10-CM | POA: Diagnosis not present

## 2016-09-06 DIAGNOSIS — S82201D Unspecified fracture of shaft of right tibia, subsequent encounter for closed fracture with routine healing: Secondary | ICD-10-CM | POA: Diagnosis not present

## 2016-09-06 DIAGNOSIS — E1165 Type 2 diabetes mellitus with hyperglycemia: Secondary | ICD-10-CM | POA: Diagnosis not present

## 2016-09-06 DIAGNOSIS — D509 Iron deficiency anemia, unspecified: Secondary | ICD-10-CM | POA: Diagnosis not present

## 2016-09-06 DIAGNOSIS — Z79899 Other long term (current) drug therapy: Secondary | ICD-10-CM

## 2016-09-06 DIAGNOSIS — N281 Cyst of kidney, acquired: Secondary | ICD-10-CM | POA: Diagnosis not present

## 2016-09-06 DIAGNOSIS — R627 Adult failure to thrive: Secondary | ICD-10-CM | POA: Diagnosis present

## 2016-09-06 DIAGNOSIS — R1031 Right lower quadrant pain: Secondary | ICD-10-CM | POA: Diagnosis not present

## 2016-09-06 DIAGNOSIS — E1169 Type 2 diabetes mellitus with other specified complication: Secondary | ICD-10-CM | POA: Diagnosis not present

## 2016-09-06 DIAGNOSIS — R109 Unspecified abdominal pain: Secondary | ICD-10-CM

## 2016-09-06 DIAGNOSIS — R2681 Unsteadiness on feet: Secondary | ICD-10-CM | POA: Diagnosis not present

## 2016-09-06 DIAGNOSIS — F339 Major depressive disorder, recurrent, unspecified: Secondary | ICD-10-CM | POA: Diagnosis not present

## 2016-09-06 DIAGNOSIS — E785 Hyperlipidemia, unspecified: Secondary | ICD-10-CM | POA: Diagnosis not present

## 2016-09-06 DIAGNOSIS — N200 Calculus of kidney: Secondary | ICD-10-CM | POA: Diagnosis not present

## 2016-09-06 HISTORY — DX: Urinary tract infection, site not specified: N39.0

## 2016-09-06 LAB — CBC WITH DIFFERENTIAL/PLATELET
Basophils Absolute: 0 10*3/uL (ref 0.0–0.1)
Basophils Relative: 0 %
Eosinophils Absolute: 0.1 10*3/uL (ref 0.0–0.7)
Eosinophils Relative: 1 %
HCT: 37.7 % — ABNORMAL LOW (ref 39.0–52.0)
Hemoglobin: 12.6 g/dL — ABNORMAL LOW (ref 13.0–17.0)
Lymphocytes Relative: 9 %
Lymphs Abs: 1.1 10*3/uL (ref 0.7–4.0)
MCH: 28.2 pg (ref 26.0–34.0)
MCHC: 33.4 g/dL (ref 30.0–36.0)
MCV: 84.3 fL (ref 78.0–100.0)
Monocytes Absolute: 1 10*3/uL (ref 0.1–1.0)
Monocytes Relative: 8 %
Neutro Abs: 10.3 10*3/uL — ABNORMAL HIGH (ref 1.7–7.7)
Neutrophils Relative %: 82 %
Platelets: 328 10*3/uL (ref 150–400)
RBC: 4.47 MIL/uL (ref 4.22–5.81)
RDW: 13.2 % (ref 11.5–15.5)
WBC: 12.5 10*3/uL — ABNORMAL HIGH (ref 4.0–10.5)

## 2016-09-06 LAB — URINALYSIS, ROUTINE W REFLEX MICROSCOPIC
Bilirubin Urine: NEGATIVE
Glucose, UA: NEGATIVE mg/dL
Ketones, ur: 20 mg/dL — AB
Nitrite: NEGATIVE
Protein, ur: 30 mg/dL — AB
Specific Gravity, Urine: 1.015 (ref 1.005–1.030)
pH: 5 (ref 5.0–8.0)

## 2016-09-06 LAB — COMPREHENSIVE METABOLIC PANEL
ALT: 18 U/L (ref 17–63)
AST: 19 U/L (ref 15–41)
Albumin: 3.5 g/dL (ref 3.5–5.0)
Alkaline Phosphatase: 108 U/L (ref 38–126)
Anion gap: 14 (ref 5–15)
BUN: 46 mg/dL — ABNORMAL HIGH (ref 6–20)
CO2: 20 mmol/L — ABNORMAL LOW (ref 22–32)
Calcium: 8.8 mg/dL — ABNORMAL LOW (ref 8.9–10.3)
Chloride: 105 mmol/L (ref 101–111)
Creatinine, Ser: 2.25 mg/dL — ABNORMAL HIGH (ref 0.61–1.24)
GFR calc Af Amer: 34 mL/min — ABNORMAL LOW (ref >60)
GFR calc non Af Amer: 30 mL/min — ABNORMAL LOW (ref >60)
Glucose, Bld: 114 mg/dL — ABNORMAL HIGH (ref 65–99)
Potassium: 3.9 mmol/L (ref 3.5–5.1)
Sodium: 139 mmol/L (ref 135–145)
Total Bilirubin: 0.7 mg/dL (ref 0.3–1.2)
Total Protein: 8 g/dL (ref 6.5–8.1)

## 2016-09-06 MED ORDER — ACETAMINOPHEN 325 MG PO TABS
650.0000 mg | ORAL_TABLET | ORAL | Status: DC | PRN
  Filled 2016-09-06: qty 2

## 2016-09-06 MED ORDER — ZOLPIDEM TARTRATE 5 MG PO TABS
5.0000 mg | ORAL_TABLET | Freq: Every evening | ORAL | Status: DC | PRN
  Administered 2016-09-09: 5 mg via ORAL
  Filled 2016-09-06 (×2): qty 1

## 2016-09-06 MED ORDER — DEXTROSE 5 % IV SOLN: 1.0000 g | INTRAVENOUS | Status: DC

## 2016-09-06 MED ORDER — ONDANSETRON HCL 4 MG/2ML IJ SOLN
4.0000 mg | INTRAMUSCULAR | Status: DC | PRN
  Administered 2016-09-06 – 2016-09-10 (×4): 4 mg via INTRAVENOUS
  Filled 2016-09-06 (×4): qty 2

## 2016-09-06 MED ORDER — BELLADONNA ALKALOIDS-OPIUM 16.2-60 MG RE SUPP
1.0000 | Freq: Four times a day (QID) | RECTAL | Status: DC | PRN
  Administered 2016-09-10 (×2): 1 via RECTAL
  Filled 2016-09-06 (×2): qty 1

## 2016-09-06 MED ORDER — DIPHENHYDRAMINE HCL 12.5 MG/5ML PO ELIX: 12.5000 mg | ORAL_SOLUTION | Freq: Four times a day (QID) | ORAL | Status: DC | PRN

## 2016-09-06 MED ORDER — VANCOMYCIN HCL IN DEXTROSE 1-5 GM/200ML-% IV SOLN
1000.0000 mg | INTRAVENOUS | Status: DC
  Administered 2016-09-07 – 2016-09-08 (×2): 1000 mg via INTRAVENOUS
  Filled 2016-09-06 (×2): qty 200

## 2016-09-06 MED ORDER — OXYCODONE-ACETAMINOPHEN 5-325 MG PO TABS
1.0000 | ORAL_TABLET | ORAL | Status: DC | PRN
  Administered 2016-09-07: 1 via ORAL
  Administered 2016-09-07 – 2016-09-09 (×8): 2 via ORAL
  Administered 2016-09-09: 1 via ORAL
  Administered 2016-09-10 (×3): 2 via ORAL
  Administered 2016-09-11: 1 via ORAL
  Filled 2016-09-06 (×5): qty 2
  Filled 2016-09-06: qty 1
  Filled 2016-09-06 (×3): qty 2
  Filled 2016-09-06: qty 1
  Filled 2016-09-06 (×5): qty 2

## 2016-09-06 MED ORDER — DIPHENHYDRAMINE HCL 50 MG/ML IJ SOLN: 12.5000 mg | Freq: Four times a day (QID) | INTRAMUSCULAR | Status: DC | PRN

## 2016-09-06 MED ORDER — SODIUM CHLORIDE 0.9 % IV BOLUS (SEPSIS)
1000.0000 mL | Freq: Once | INTRAVENOUS | Status: AC
  Administered 2016-09-06: 1000 mL via INTRAVENOUS

## 2016-09-06 MED ORDER — PIPERACILLIN-TAZOBACTAM 3.375 G IVPB: 3.3750 g | Freq: Three times a day (TID) | INTRAVENOUS | Status: DC

## 2016-09-06 MED ORDER — SODIUM CHLORIDE 0.9 % IV SOLN
INTRAVENOUS | Status: DC
  Administered 2016-09-06 – 2016-09-09 (×8): via INTRAVENOUS

## 2016-09-06 MED ORDER — VANCOMYCIN HCL IN DEXTROSE 1-5 GM/200ML-% IV SOLN
1000.0000 mg | Freq: Once | INTRAVENOUS | Status: AC
  Administered 2016-09-06: 1000 mg via INTRAVENOUS
  Filled 2016-09-06: qty 200

## 2016-09-06 MED ORDER — PIPERACILLIN-TAZOBACTAM 3.375 G IVPB
3.3750 g | Freq: Three times a day (TID) | INTRAVENOUS | Status: DC
Start: 2016-09-07 — End: 2016-09-09
  Administered 2016-09-07 – 2016-09-09 (×9): 3.375 g via INTRAVENOUS
  Filled 2016-09-06 (×10): qty 50

## 2016-09-06 MED ORDER — HYDROMORPHONE HCL 1 MG/ML IJ SOLN
0.5000 mg | INTRAMUSCULAR | Status: DC | PRN
  Administered 2016-09-06: 1 mg via INTRAVENOUS
  Administered 2016-09-06: 0.5 mg via INTRAVENOUS
  Administered 2016-09-07 (×5): 1 mg via INTRAVENOUS
  Filled 2016-09-06 (×7): qty 1

## 2016-09-06 MED ORDER — MORPHINE SULFATE (PF) 4 MG/ML IV SOLN
4.0000 mg | Freq: Once | INTRAVENOUS | Status: AC
  Administered 2016-09-06: 4 mg via INTRAVENOUS
  Filled 2016-09-06: qty 1

## 2016-09-06 MED ORDER — PIPERACILLIN-TAZOBACTAM 3.375 G IVPB 30 MIN
3.3750 g | Freq: Once | INTRAVENOUS | Status: AC
  Administered 2016-09-06: 3.375 g via INTRAVENOUS
  Filled 2016-09-06: qty 50

## 2016-09-06 NOTE — H&P (Signed)
Urology Admission H&P  Chief Complaint: abdominal pain  History of Present Illness: Mr Binette is a 61yo with a  Hx of nephrolithiasis who is s/p R URS complicated by ureteral avulsion requiring right ureteral reimplant with Boari flap and psoas hitch. Since discharge had has had progressively worsening right flank pain with associated urinary urgency, frequency, and dysuria. He has been having fevers to 101 at home. For the past week he has had worsening nausea, vomiting a fatigue. He presented to the ER and a CT scan was obtained which showed right perinephric stranding consistent with pyelonephritis. No abscess or urinoma. He has presumed fat necrosis under his rectus. Right ureteral stent in good position. Creatinine is elevated at 2.25. WBC 12.5. UA shows many bacteria.   Past Medical History:  Diagnosis Date  . Depression   . GERD (gastroesophageal reflux disease)   . Hemorrhoids   . Hypertension   . Macular degeneration   . Microcytic anemia   . Prostate cancer ([place]) 08/2009  . Tubular adenoma    Past Surgical History:  Procedure Laterality Date  . CYSTOSCOPY WITH RETROGRADE PYELOGRAM, URETEROSCOPY AND STENT PLACEMENT Right 07/31/2016   Procedure: CYSTOSCOPY WITH RIGHT  RETROGRADE PYELOGRAM, URETEROSCOPY;  Surgeon: [name], MD;  Location: [facility];  Service: Urology;  Laterality: Right;  . INGUINAL HERNIA REPAIR    . Left ankle joint fusion  1981  . PROSTATE BIOPSY     x 2  . URETERAL REIMPLANTION  07/31/2016   Procedure: URETERAL REIMPLANT, right boari flap, right psoas hitch;  Surgeon: [name], MD;  Location: [facility];  Service: Urology;;    Home Medications:   (Not in a hospital admission) Allergies: No Known Allergies  Family History  Problem Relation Age of Onset  . Lung cancer Mother        Deceased  . Throat cancer Brother   . Pancreatic cancer Father   . Heart disease Father        Deceased  . Lung cancer Maternal Uncle        nephew  .  Diabetes Other    Social History:  reports that he has never smoked. He has never used smokeless tobacco. He reports that he drinks alcohol. He reports that he does not use drugs.  Review of Systems  Constitutional: Positive for chills, fever and malaise/fatigue.  Gastrointestinal: Positive for nausea and vomiting.  Genitourinary: Positive for dysuria, flank pain, frequency and urgency.  Neurological: Positive for weakness.  All other systems reviewed and are negative.   Physical Exam:  Vital signs in last 24 hours: Temp:  [97.9 F (36.6 C)-98.2 F (36.8 C)] 97.9 F (36.6 C) (06/22 1737) Pulse Rate:  [88-103] 88 (06/22 1737) Resp:  [14-18] 14 (06/22 1737) BP: (101-122)/(63-76) 122/76 (06/22 1737) SpO2:  [95 %-98 %] 98 % (06/22 1737) Physical Exam  Constitutional: He is oriented to person, place, and time. He appears well-developed and well-nourished.  HENT:  Head: Normocephalic and atraumatic.  Eyes: EOM are normal. Pupils are equal, round, and reactive to light.  Neck: Normal range of motion. No thyromegaly present.  Cardiovascular: Normal rate and regular rhythm.   Respiratory: Effort normal and breath sounds normal.  GI: Soft. He exhibits distension. He exhibits no mass. There is tenderness. There is no rebound and no guarding.  Genitourinary: Testes normal and penis normal.  Musculoskeletal: Normal range of motion. He exhibits no edema.  Neurological: He is alert and oriented to person, place, and time.  Skin: Skin is warm and dry.  Psychiatric: He has a normal mood and affect. His behavior is normal. Judgment and thought content normal.    Laboratory Data:  Results for orders placed or performed during the hospital encounter of 09/06/16 (from the past 24 hour(s))  CBC with Differential     Status: Abnormal   Collection Time: 09/06/16  3:40 PM  Result Value Ref Range   WBC 12.5 (H) 4.0 - 10.5 K/uL   RBC 4.47 4.22 - 5.81 MIL/uL   Hemoglobin 12.6 (L) 13.0 - 17.0 g/dL    HCT 37.7 (L) 39.0 - 52.0 %   MCV 84.3 78.0 - 100.0 fL   MCH 28.2 26.0 - 34.0 pg   MCHC 33.4 30.0 - 36.0 g/dL   RDW 13.2 11.5 - 15.5 %   Platelets 328 150 - 400 K/uL   Neutrophils Relative % 82 %   Neutro Abs 10.3 (H) 1.7 - 7.7 K/uL   Lymphocytes Relative 9 %   Lymphs Abs 1.1 0.7 - 4.0 K/uL   Monocytes Relative 8 %   Monocytes Absolute 1.0 0.1 - 1.0 K/uL   Eosinophils Relative 1 %   Eosinophils Absolute 0.1 0.0 - 0.7 K/uL   Basophils Relative 0 %   Basophils Absolute 0.0 0.0 - 0.1 K/uL  Comprehensive metabolic panel     Status: Abnormal   Collection Time: 09/06/16  3:40 PM  Result Value Ref Range   Sodium 139 135 - 145 mmol/L   Potassium 3.9 3.5 - 5.1 mmol/L   Chloride 105 101 - 111 mmol/L   CO2 20 (L) 22 - 32 mmol/L   Glucose, Bld 114 (H) 65 - 99 mg/dL   BUN 46 (H) 6 - 20 mg/dL   Creatinine, Ser 2.25 (H) 0.61 - 1.24 mg/dL   Calcium 8.8 (L) 8.9 - 10.3 mg/dL   Total Protein 8.0 6.5 - 8.1 g/dL   Albumin 3.5 3.5 - 5.0 g/dL   AST 19 15 - 41 U/L   ALT 18 17 - 63 U/L   Alkaline Phosphatase 108 38 - 126 U/L   Total Bilirubin 0.7 0.3 - 1.2 mg/dL   GFR calc non Af Amer 30 (L) >60 mL/min   GFR calc Af Amer 34 (L) >60 mL/min   Anion gap 14 5 - 15  Urinalysis, Routine w reflex microscopic     Status: Abnormal   Collection Time: 09/06/16  6:03 PM  Result Value Ref Range   Color, Urine YELLOW YELLOW   APPearance CLOUDY (A) CLEAR   Specific Gravity, Urine 1.015 1.005 - 1.030   pH 5.0 5.0 - 8.0   Glucose, UA NEGATIVE NEGATIVE mg/dL   Hgb urine dipstick LARGE (A) NEGATIVE   Bilirubin Urine NEGATIVE NEGATIVE   Ketones, ur 20 (A) NEGATIVE mg/dL   Protein, ur 30 (A) NEGATIVE mg/dL   Nitrite NEGATIVE NEGATIVE   Leukocytes, UA LARGE (A) NEGATIVE   RBC / HPF TOO NUMEROUS TO COUNT 0 - 5 RBC/hpf   WBC, UA TOO NUMEROUS TO COUNT 0 - 5 WBC/hpf   Bacteria, UA MANY (A) NONE SEEN   Squamous Epithelial / LPF 0-5 (A) NONE SEEN   Mucous PRESENT    No results found for this or any previous visit  (from the past 240 hour(s)). Creatinine:  Recent Labs  09/06/16 1540  CREATININE 2.25*   Baseline Creatinine: 1.5  Impression/Assessment:  60yo with pyelonephritis  Plan:  1. Pt will be admitted for broad spectrum antibiotics pending urine culture  Nicolette Bang 09/06/2016, 7:14 PM

## 2016-09-06 NOTE — ED Provider Notes (Signed)
[facility] DEPT Provider Note   CSN: [id number] Arrival date & time: 09/06/16  1421     History   Chief Complaint Chief Complaint  Patient presents with  . Abdominal Pain    HPI [name] is a 61 y.o. male.  HPI   61 year old male presented today with complaints of abdominal pain nausea and vomiting.  Patient notes that he was diagnosed with kidney stones and underwent procedure to have them removed.  He notes the procedure was complicated with the ureteral injury requiring emergent reconstruction with stent placement.  Patient notes that shortly after the surgery he was feeling well and was discharged on 08/08/2016.  He notes after being home for approximately one week he started to develop right lower abdominal discomfort with nausea and vomiting.  He notes that over the last week he is unable to tolerate p.o. with frequent episodes of vomiting.  Patient denies any recent fevers, reports he still passing gas.  Patient feels extremely tired.  Patient also reports that he has had burning with urination, and very small urine output.  Past Medical History:  Diagnosis Date  . Depression   . GERD (gastroesophageal reflux disease)   . Hemorrhoids   . Hypertension   . Macular degeneration   . Microcytic anemia   . Prostate cancer ([place]) 08/2009  . Tubular adenoma     Patient Active Problem List   Diagnosis Date Noted  . Nephrolithiasis 08/01/2016  . Ureteral trauma, subsequent encounter 08/01/2016  . Rectal bleeding 07/31/2016  . Uncontrolled type 2 diabetes mellitus with hyperglycemia ([place]) 07/31/2016  . Urolithiasis 07/31/2016  . Right ureteral stone   . Type 2 diabetes mellitus without complication, without long-term current use of insulin ([place]) 05/29/2016  . Dyslipidemia associated with type 2 diabetes mellitus ([place]) 05/29/2016  . Closed nondisplaced spiral fracture of shaft of right tibia   . Right tibial fracture 03/22/2016  . Microcytic anemia 02/17/2012  .  Prostate cancer ([place]) 02/17/2012  . 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 Benign hypertension 02/17/2012  . Depressive disorder 02/17/2012    Past Surgical History:  Procedure Laterality Date  . CYSTOSCOPY WITH RETROGRADE PYELOGRAM, URETEROSCOPY AND STENT PLACEMENT Right 07/31/2016   Procedure: CYSTOSCOPY WITH RIGHT  RETROGRADE PYELOGRAM, URETEROSCOPY;  Surgeon: [name], MD;  Location: [facility];  Service: Urology;  Laterality: Right;  . INGUINAL HERNIA REPAIR    . Left ankle joint fusion  1981  . PROSTATE BIOPSY     x 2  . URETERAL REIMPLANTION  07/31/2016   Procedure: URETERAL REIMPLANT, right boari flap, right psoas hitch;  Surgeon: [name], MD;  Location: [facility];  Service: Urology;;       Home Medications    Prior to Admission medications   Medication Sig Start Date End Date Taking? Authorizing Provider  acetaminophen (TYLENOL) 500 MG tablet Take 500 mg by mouth every 6 (six) hours as needed for mild pain or moderate pain.    [provider]  amLODipine (NORVASC) 10 MG tablet Take 10 mg by mouth daily.     [provider]  aspirin EC 81 MG tablet Take 81 mg by mouth daily.    [provider]  atorvastatin (LIPITOR) 20 MG tablet Take 20 mg by mouth daily.    [provider]  HYDROcodone-acetaminophen (NORCO/VICODIN) 5-325 MG tablet Take 1 tablet by mouth every 6 (six) hours as needed for moderate pain (Per Dr. Brett Fairy).    [provider]  LORazepam (ATIVAN)  0.5 MG tablet Give 1 tablet by mouth every 12 hours as needed and 1.5 tablet by mouth at bedtime for anxiety for 7 days    [provider]  losartan-hydrochlorothiazide (HYZAAR) 100-25 MG tablet Take 1 tablet by mouth daily.    [provider]  metFORMIN (GLUCOPHAGE) 1000 MG tablet Take 1,000 mg by mouth 2 (two) times daily with a meal.    [provider]  metoprolol (LOPRESSOR) 50 MG tablet Take 50 mg by mouth daily.    [provider]  Multiple Vitamin (MULTIVITAMIN) tablet Take 1 tablet by mouth daily.    [provider]  polyethylene glycol (MIRALAX / GLYCOLAX) packet Take 17 g by mouth daily. Stop taking if having more than 2 soft/loose bowel movements daily. 08/08/16   [name], MD  promethazine (PHENERGAN) 25 MG tablet Take 25 mg by mouth every 8 (eight) hours as needed for nausea or vomiting.    [provider]  senna-docusate (SENOKOT-S) 8.6-50 MG tablet Take 1 tablet by mouth at bedtime. 08/08/16   [name], MD  sertraline (ZOLOFT) 25 MG tablet Take 25 mg by mouth daily. Give 1 tablet by mouth daily until 09-04-16 09-04-16 Give 2 (50 mg) tablets by mouth daily    [provider]    Family History Family History  Problem Relation Age of Onset  . Lung cancer Mother        Deceased  . Throat cancer Brother   . Pancreatic cancer Father   . Heart disease Father        Deceased  . Lung cancer Maternal Uncle        nephew  . Diabetes Other     Social History Social History  Substance Use Topics  . Smoking status: Never Smoker  . Smokeless tobacco: Never Used  . Alcohol use Yes     Comment: Occasional     Allergies   Patient has no known allergies.   Review of Systems Review of Systems  All other systems reviewed and are negative.    Physical Exam Updated Vital Signs BP 122/76 (BP Location: Left Arm)   Pulse 88   Temp 97.9 F (36.6 C) (Oral)   Resp 14   SpO2 98%   Physical Exam  Constitutional: He is oriented to person, place, and time. He appears well-developed and well-nourished.  HENT:  Head: Normocephalic and atraumatic.  Eyes: Conjunctivae are normal. Pupils are equal, round, and reactive to light. Right eye exhibits no discharge. Left eye exhibits no discharge. No scleral icterus.  Neck: Normal range of motion. No JVD present. No tracheal deviation present.  Pulmonary/Chest: Effort normal. No stridor.  Abdominal:  Ventral  incision clean with no signs of surrounding infection.  Tenderness to palpation over the incision and right lower mid abdomen  Neurological: He is alert and oriented to person, place, and time. Coordination normal.  Psychiatric: He has a normal mood and affect. His behavior is normal. Judgment and thought content normal.  Nursing note and vitals reviewed.    ED Treatments / Results  Labs (all labs ordered are listed, but only abnormal results are displayed) Labs Reviewed  CBC WITH DIFFERENTIAL/PLATELET - Abnormal; Notable for the following:       Result Value   WBC 12.5 (*)    Hemoglobin 12.6 (*)    HCT 37.7 (*)    Neutro Abs 10.3 (*)    All other components within normal limits  COMPREHENSIVE METABOLIC PANEL -  Abnormal; Notable for the following:    CO2 20 (*)    Glucose, Bld 114 (*)    BUN 46 (*)    Creatinine, Ser 2.25 (*)    Calcium 8.8 (*)    GFR calc non Af Amer 30 (*)    GFR calc Af Amer 34 (*)    All other components within normal limits  URINALYSIS, ROUTINE W REFLEX MICROSCOPIC - Abnormal; Notable for the following:    APPearance CLOUDY (*)    Hgb urine dipstick LARGE (*)    Ketones, ur 20 (*)    Protein, ur 30 (*)    Leukocytes, UA LARGE (*)    Bacteria, UA MANY (*)    Squamous Epithelial / LPF 0-5 (*)    All other components within normal limits  URINE CULTURE    EKG  EKG Interpretation None       Radiology Ct Abdomen Pelvis Wo Contrast  Result Date: 09/06/2016 CLINICAL DATA:  Nausea, vomiting and worsening abdominal pain today, had 3 kidney stones removed 1 month ago, RIGHT-side pain worse with palpation, has a 4-5 inch incision in the mid abdomen ; history prostate cancer, GERD, hypertension, prior RIGHT ureteral reimplantation EXAM: CT ABDOMEN AND PELVIS WITHOUT CONTRAST TECHNIQUE: Multidetector CT imaging of the abdomen and pelvis was performed following the standard protocol without IV contrast. Sagittal and coronal MPR images reconstructed from axial  data set. Oral contrast was not administered COMPARISON:  08/22/2016 FINDINGS: Lower chest: Mild peribronchial thickening at lower lungs. Minimal RIGHT basilar atelectasis. Hepatobiliary: Gallbladder and liver unremarkable Pancreas: Normal appearance Spleen: Normal appearance Adrenals/Urinary Tract: Adrenal glands normal appearance. Large cyst inferior pole LEFT kidney 4.9 x 4.3 cm image 45. Large cyst inferior pole RIGHT kidney 4.9 x 4.2 cm image 47. Small nonobstructing RIGHT renal calculi. RIGHT ureteral stent extends from upper pole renal collecting system into urinary bladder. Persistent mild RIGHT hydronephrosis and ureteral dilatation. Thickening of the wall of the mid RIGHT ureter which could reflect prior instrumentation or surgery/reimplantation. Mild surrounding edema at this portion of the mid ureter also noted. Bladder unremarkable. Stomach/Bowel: Mild edematous changes are seen adjacent to the appendix though the epicenter of this process appears more related to the RIGHT ureter, question secondary changes. Appendix normal caliber. Stomach and bowel loops otherwise unremarkable. Vascular/Lymphatic: Atherosclerotic calcifications aorta without aneurysm. No adenopathy. Minimal coronary trial calcification. Reproductive: Prostatic enlargement gland 5.2 x 4.5 cm image 86. Other: BILATERAL inguinal hernias containing fat. No free air or free fluid. Persistent area of infiltrative change involving the fat in the anterior RIGHT mid abdomen, of could be related to prior surgery or fat necrosis, area 4.8 x 1.8 cm image 61 extending at least 7 cm length. Musculoskeletal: Unremarkable IMPRESSION: Persistent mild RIGHT hydronephrosis and ureteral dilatation despite ureteral stent. Persistent thickening of the mid RIGHT ureter with surrounding infiltrative changes question related to prior instrumentation or surgery/reimplantation unchanged. Area potential fat necrosis in the anterior RIGHT mid abdomen. BILATERAL  renal cysts and nonobstructing RIGHT renal calculi. BILATERAL inguinal hernias. Prostatic enlargement. Electronically Signed   By: Lavonia Dana M.D.   On: 09/06/2016 17:29    Procedures Procedures (including critical care time)  Medications Ordered in ED Medications  morphine 4 MG/ML injection 4 mg (not administered)  sodium chloride 0.9 % bolus 1,000 mL (1,000 mLs Intravenous New Bag/Given 09/06/16 1546)     Initial Impression / Assessment and Plan / ED Course  I have reviewed the triage vital signs and the nursing notes.  Pertinent labs & imaging results that were available during my care of the patient were reviewed by me and considered in my medical decision making (see chart for details).      Final Clinical Impressions(s) / ED Diagnoses   Final diagnoses:  Pyelonephritis    Labs: Urinalysis, urine culture, CBC, CMP  Imaging: CT abdomen pelvis without contrast  Consults: Urology  Therapeutics: Vancomycin, Zosyn, morphine  Discharge Meds:   Assessment/Plan: 61 year old male presents today with complaints of abdominal pain.  He is status post urologic procedure for kidney stones.  Patient had to have reconstructive surgery complains of worsening abdominal pain nausea vomiting.  Patient's workup here consistent with urinary tract infection question pyelonephritis.  He has an AK I2.25 from 1.5.  Neurology was consulted who would evaluate the patient here in the ED and admit for ongoing management.    New Prescriptions New Prescriptions   No medications on file     Francee Gentile 09/06/16 1925    Charlesetta Shanks, MD 09/27/16 [phone]

## 2016-09-06 NOTE — Progress Notes (Signed)
Pharmacy Antibiotic Note  [name] is a 61 y.o. male admitted on 09/06/2016 with abdominal pian with nausea and vomiting.  Recent kidney stone removal with complications.  Pharmacy has been consulted for vancomycin and zosyn dosing.  Plan: Zosyn 3.375g IV Q8H infused over 4hrs. Vancomycin 1gm x 1 then continue same dose q24h Daily Scr Follow cultures and clinical course   Weight: 192 lb (87.1 kg)  Temp (24hrs), Avg:98.1 F (36.7 C), Min:97.9 F (36.6 C), Max:98.2 F (36.8 C)   Recent Labs Lab 09/06/16 1540  WBC 12.5*  CREATININE 2.25*    Estimated Creatinine Clearance: 35.2 mL/min (A) (by C-G formula based on SCr of 2.25 mg/dL (H)).    No Known Allergies  Antimicrobials this admission: 6/22 ceftriaxone x 1 6/22 vanc >> 6/22 zosyn >> Microbiology results:  6/22 UCx: sent  Thank you for allowing pharmacy to be a part of this patient's care.  Dolly Rias RPh 09/06/2016, 7:49 PM Pager [phone]

## 2016-09-06 NOTE — ED Notes (Signed)
Bed: WA07 Expected date:  Expected time:  Means of arrival:  Comments: EMS/n/v 

## 2016-09-06 NOTE — Progress Notes (Signed)
Patient has been accepted to [facility] if stable for discharge.  Kingsley Spittle, LCSWA Clinical Social Worker [phone]

## 2016-09-06 NOTE — Patient Outreach (Signed)
[facility]) Care Management  09/06/2016  [name] 1955-07-25 [id number]   CSW made a second attempt to try and contact patient today to perform phone assessment, as well as assess and assist with social needs and services, without success.  A HIPAA compliant message was left for patient on voicemail.  CSW is currently awaiting a return call. CSW will make a third and final outreach attempt within the next two weeks, upon CSW's return to the office from vacation, if CSW does not receive a return call from patient in the meantime. After thorough review of patient's EMR (Electronic Medical Record) in [facility], CSW noted that patient presented to the [facility] Emergency Department today with complaints of abdominal pain.  Patient's discharge plan will be to go to [facility], [place], where patient will receive short-term rehabilitative services, per patient's request.  CSW will continue to follow along. [name], BSW, MSW, LCSW  Licensed Education officer, [facility]  [address], [place], [place] [address] Physical Address-[address], [place],  [address] Toll Free Main # [phone] Fax # [phone] Cell # [phone]  Office # [phone] [name]

## 2016-09-06 NOTE — ED Triage Notes (Signed)
Per EMS, pt has had nausea, emesis, worsening pain today. Pt had 3 kidney stones removed 1 month ago. Pt has 4-5 inch incision to mid abdomen. Pain is located center to right side. Pain is worse with palpation. Pt received 4mg  zofran and 500cc NS.     CBG 117 BP 115/76 HR 110

## 2016-09-06 NOTE — Consult Note (Signed)
   [name] CM Inpatient Consult   09/06/2016  JGUADALUPE OPIELA 1955/04/07 [id number]    Made aware of patient outreach by [place]. Please see patient outreach notes for further details under chart review tab.  Patient is currently in ED at [facility]. Chart reviewed and noted Mr. Mcginty discharge plan is SNF when stable.  Confirmed dc plans with Junie Panning, ED LCSW.  Marthenia Rolling, MSN-Ed, RN,BSN [facility] Liaison [phone]

## 2016-09-07 LAB — CBC
HEMATOCRIT: 35.2 % — AB (ref 39.0–52.0)
HEMOGLOBIN: 11.3 g/dL — AB (ref 13.0–17.0)
MCH: 27.4 pg (ref 26.0–34.0)
MCHC: 32.1 g/dL (ref 30.0–36.0)
MCV: 85.2 fL (ref 78.0–100.0)
Platelets: 293 10*3/uL (ref 150–400)
RBC: 4.13 MIL/uL — ABNORMAL LOW (ref 4.22–5.81)
RDW: 13.1 % (ref 11.5–15.5)
WBC: 10.9 10*3/uL — ABNORMAL HIGH (ref 4.0–10.5)

## 2016-09-07 LAB — BASIC METABOLIC PANEL
Anion gap: 12 (ref 5–15)
BUN: 33 mg/dL — ABNORMAL HIGH (ref 6–20)
CHLORIDE: 103 mmol/L (ref 101–111)
CO2: 24 mmol/L (ref 22–32)
Calcium: 8.4 mg/dL — ABNORMAL LOW (ref 8.9–10.3)
Creatinine, Ser: 1.98 mg/dL — ABNORMAL HIGH (ref 0.61–1.24)
GFR calc Af Amer: 40 mL/min — ABNORMAL LOW (ref >60)
GFR calc non Af Amer: 34 mL/min — ABNORMAL LOW (ref >60)
GLUCOSE: 134 mg/dL — AB (ref 65–99)
Potassium: 3.4 mmol/L — ABNORMAL LOW (ref 3.5–5.1)
Sodium: 139 mmol/L (ref 135–145)

## 2016-09-07 MED ORDER — PHENAZOPYRIDINE HCL 200 MG PO TABS: 200.0000 mg | ORAL_TABLET | Freq: Three times a day (TID) | ORAL | Status: DC

## 2016-09-07 MED ORDER — POLYETHYLENE GLYCOL 3350 17 G PO PACK
17.0000 g | PACK | Freq: Every day | ORAL | Status: DC
  Administered 2016-09-07 – 2016-09-10 (×4): 17 g via ORAL
  Filled 2016-09-07 (×3): qty 1

## 2016-09-07 MED ORDER — PHENAZOPYRIDINE HCL 200 MG PO TABS
200.0000 mg | ORAL_TABLET | Freq: Three times a day (TID) | ORAL | Status: AC | PRN
  Administered 2016-09-07 – 2016-09-08 (×3): 200 mg via ORAL
  Filled 2016-09-07 (×3): qty 1

## 2016-09-07 MED ORDER — HYDROMORPHONE HCL 1 MG/ML IJ SOLN
0.5000 mg | INTRAMUSCULAR | Status: DC | PRN
  Administered 2016-09-07 – 2016-09-08 (×3): 1 mg via INTRAVENOUS
  Administered 2016-09-08 (×3): 0.5 mg via INTRAVENOUS
  Administered 2016-09-09 – 2016-09-10 (×6): 1 mg via INTRAVENOUS
  Filled 2016-09-07 (×3): qty 0.5
  Filled 2016-09-07 (×6): qty 1
  Filled 2016-09-07: qty 0.5
  Filled 2016-09-07 (×5): qty 1

## 2016-09-07 NOTE — Progress Notes (Signed)
Patient has continuously called out for prn pain meds IV Dilaudid. Does not want po pain meds, refuses them. At times has been yelling out in hall for "nurse". Giving meds per prn schedule. Eulas Post, RN

## 2016-09-08 LAB — CREATININE, SERUM
Creatinine, Ser: 1.44 mg/dL — ABNORMAL HIGH (ref 0.61–1.24)
GFR calc Af Amer: 59 mL/min — ABNORMAL LOW (ref >60)
GFR, EST NON AFRICAN AMERICAN: 51 mL/min — AB (ref >60)

## 2016-09-08 NOTE — Progress Notes (Addendum)
Subjective: Patient reports he feels much better. Flank pain and dysuria much improved. Urine cx growing > 100k unidentified organism. On Vanc /Zosyn. No trouble voiding. + flatus and BM last night.   Objective: Vital signs in last 24 hours: Temp:  [97.5 F (36.4 C)-97.7 F (36.5 C)] 97.5 F (36.4 C) (06/24 0630) Pulse Rate:  [76-94] 76 (06/24 0633) Resp:  [17-20] 18 (06/24 1601) BP: (132-142)/(79-97) 132/79 (06/24 0932) SpO2:  [94 %-96 %] 96 % (06/24 3557)  Intake/Output from previous day: 06/23 0701 - 06/24 0700 In: 3465 [P.O.:240; I.V.:2875; IV Piggyback:350] Out: 1600 [Urine:1600] Intake/Output this shift: Total I/O In: 220 [P.O.:220] Out: 250 [Urine:250]  Physical Exam:  NAD - sitting up in bed eating lunch (tuna fish sandwich) Alert and O x 3 CV - RRResp - regular effort and depth Abd - soft, distended, non-tender, good BS, incision well-healed  GU - condom cath - urine light pink   Lab Results:  Recent Labs  09/06/16 1540 09/07/16 0514  HGB 12.6* 11.3*  HCT 37.7* 35.2*   BMET  Recent Labs  09/06/16 1540 09/07/16 0514 09/08/16 0514  NA 139 139  --   K 3.9 3.4*  --   CL 105 103  --   CO2 20* 24  --   GLUCOSE 114* 134*  --   BUN 46* 33*  --   CREATININE 2.25* 1.98* 1.44*  CALCIUM 8.8* 8.4*  --    No results for input(s): LABPT, INR in the last 72 hours. No results for input(s): LABURIN in the last 72 hours. Results for orders placed or performed during the hospital encounter of 09/06/16  Urine culture     Status: Abnormal (Preliminary result)   Collection Time: 09/06/16  6:03 PM  Result Value Ref Range Status   Specimen Description URINE, CLEAN CATCH  Final   Special Requests NONE  Final   Culture (A)  Final    >=100,000 COLONIES/mL UNIDENTIFIED ORGANISM IDENTIFICATION TO FOLLOW Performed at [facility], [address], [place],  [address]    Report Status PENDING  Incomplete    Studies/Results: Ct Abdomen Pelvis Wo  Contrast  Result Date: 09/06/2016 CLINICAL DATA:  Nausea, vomiting and worsening abdominal pain today, had 3 kidney stones removed 1 month ago, RIGHT-side pain worse with palpation, has a 4-5 inch incision in the mid abdomen ; history prostate cancer, GERD, hypertension, prior RIGHT ureteral reimplantation EXAM: CT ABDOMEN AND PELVIS WITHOUT CONTRAST TECHNIQUE: Multidetector CT imaging of the abdomen and pelvis was performed following the standard protocol without IV contrast. Sagittal and coronal MPR images reconstructed from axial data set. Oral contrast was not administered COMPARISON:  08/22/2016 FINDINGS: Lower chest: Mild peribronchial thickening at lower lungs. Minimal RIGHT basilar atelectasis. Hepatobiliary: Gallbladder and liver unremarkable Pancreas: Normal appearance Spleen: Normal appearance Adrenals/Urinary Tract: Adrenal glands normal appearance. Large cyst inferior pole LEFT kidney 4.9 x 4.3 cm image 45. Large cyst inferior pole RIGHT kidney 4.9 x 4.2 cm image 47. Small nonobstructing RIGHT renal calculi. RIGHT ureteral stent extends from upper pole renal collecting system into urinary bladder. Persistent mild RIGHT hydronephrosis and ureteral dilatation. Thickening of the wall of the mid RIGHT ureter which could reflect prior instrumentation or surgery/reimplantation. Mild surrounding edema at this portion of the mid ureter also noted. Bladder unremarkable. Stomach/Bowel: Mild edematous changes are seen adjacent to the appendix though the epicenter of this process appears more related to the RIGHT ureter, question secondary changes. Appendix normal caliber. Stomach and bowel loops  otherwise unremarkable. Vascular/Lymphatic: Atherosclerotic calcifications aorta without aneurysm. No adenopathy. Minimal coronary trial calcification. Reproductive: Prostatic enlargement gland 5.2 x 4.5 cm image 86. Other: BILATERAL inguinal hernias containing fat. No free air or free fluid. Persistent area of  infiltrative change involving the fat in the anterior RIGHT mid abdomen, of could be related to prior surgery or fat necrosis, area 4.8 x 1.8 cm image 61 extending at least 7 cm length. Musculoskeletal: Unremarkable IMPRESSION: Persistent mild RIGHT hydronephrosis and ureteral dilatation despite ureteral stent. Persistent thickening of the mid RIGHT ureter with surrounding infiltrative changes question related to prior instrumentation or surgery/reimplantation unchanged. Area potential fat necrosis in the anterior RIGHT mid abdomen. BILATERAL renal cysts and nonobstructing RIGHT renal calculi. BILATERAL inguinal hernias. Prostatic enlargement. Electronically Signed   By: Lavonia Dana M.D.   On: 09/06/2016 17:29    Assessment/Plan: S/p Right ureteral reimplant with boari flap and now UTI - afebrile with better Cr and WBC today -cont vanc, zosyn - urine Cx growing > 100K enterococcus - spoke to pharmacy - we'll keep both abx for now and wean when sensitivities come back  -discussed foley to max drain - pt declined -on regular diet -pt also reports he has a "score of 6" prostate Ca for several years followed in [place] and wanted to discuss management options. Will notify Dr. Louis Meckel.    LOS: 2 days   Curlie Sittner 09/08/2016, 1:10 PM

## 2016-09-08 NOTE — Progress Notes (Signed)
Nutrition Brief Note  Patient identified on the Malnutrition Screening Tool (MST) Report  Suspect weight from 6/22 an error as the weight recorded earlier on 6/22 was 192 lb (87.1kg). Pt eating 100% of meals today.   Wt Readings from Last 15 Encounters:  09/06/16 179 lb 0.2 oz (81.2 kg)  08/28/16 192 lb 12.8 oz (87.5 kg)  08/21/16 198 lb (89.8 kg)  08/19/16 198 lb (89.8 kg)  08/09/16 202 lb 4.8 oz (91.8 kg)  08/08/16 202 lb 2.6 oz (91.7 kg)  07/25/16 190 lb (86.2 kg)  07/24/16 190 lb (86.2 kg)  07/04/16 190 lb (86.2 kg)  02/20/16 190 lb (86.2 kg)  01/05/13 208 lb 4 oz (94.5 kg)  02/20/12 202 lb (91.6 kg)  02/17/12 202 lb 4 oz (91.7 kg)    Body mass index is 28.89 kg/m. Patient meets criteria for overweight based on current BMI.   Current diet order is regular, patient is consuming approximately 100% of meals at this time. Labs and medications reviewed.   No nutrition interventions warranted at this time. If nutrition issues arise, please consult RD.   Randy Bibles, MS, RD, LDN Pager: [phone] After Hours Pager: [phone]

## 2016-09-08 NOTE — Progress Notes (Signed)
Patient has been using his call bell to call out at least every 30 minutes this shift- at times patient calls out every 3 minutes. Patient continuously asking for pain medication and Pyridium but appears to be resting comfortably in bed. Pain medication given as allowed when requested. VSS. C/o his right abdominal pain is radiating to his right flank and his pain remains between 7-9. Began yelling "nurse!" loudly into the hallway when RN did not respond quickly enough. Pt reminded that RN has 4-5 other patients to care for and cannot be in his room constantly. Patient complained that the secretary was "sarcastic" and asked why the RN is "tormenting him." Also told RN, "If you want to throw me out of here, just do it." RN was calm and reassuring. Pt apologizes for "not being patient" repetitively but continues to press call bell constantly. RN makes goals with patient such as "I will be back in 30 minutes" but patient cannot wait that long without calling out and asking for RN.

## 2016-09-09 ENCOUNTER — Other Ambulatory Visit: Payer: Self-pay

## 2016-09-09 LAB — CREATININE, SERUM
CREATININE: 1.31 mg/dL — AB (ref 0.61–1.24)
GFR calc Af Amer: >60 mL/min (ref >60)
GFR, EST NON AFRICAN AMERICAN: 57 mL/min — AB (ref >60)

## 2016-09-09 LAB — URINE CULTURE: Culture: >=100000 — AB

## 2016-09-09 MED ORDER — KETOROLAC TROMETHAMINE 15 MG/ML IJ SOLN
15.0000 mg | Freq: Four times a day (QID) | INTRAMUSCULAR | Status: AC
  Administered 2016-09-09 (×3): 15 mg via INTRAVENOUS
  Filled 2016-09-09 (×3): qty 1

## 2016-09-09 MED ORDER — TAMSULOSIN HCL 0.4 MG PO CAPS
0.4000 mg | ORAL_CAPSULE | Freq: Every day | ORAL | Status: DC
  Administered 2016-09-09 – 2016-09-11 (×3): 0.4 mg via ORAL
  Filled 2016-09-09 (×3): qty 1

## 2016-09-09 MED ORDER — AMOXICILLIN-POT CLAVULANATE 875-125 MG PO TABS
1.0000 | ORAL_TABLET | Freq: Two times a day (BID) | ORAL | Status: DC
  Administered 2016-09-09 – 2016-09-11 (×4): 1 via ORAL
  Filled 2016-09-09 (×4): qty 1

## 2016-09-09 NOTE — Evaluation (Signed)
Physical Therapy Evaluation Patient Details Name: [name] MRN: [id number] DOB: June 17, 1955 Today's Date: 09/09/2016   History of Present Illness  61 y/o male admitted with pyleonephritis.  PMH includes: ureteral reconstructions (boari flap, psoas hitch) in 5/18, R tib/fib fx 1/18, HTN, DM, mac deg, prostate CA  Clinical Impression  Pt admitted with above diagnosis. Pt currently with functional limitations due to the deficits listed below (see PT Problem List). Pt will benefit from skilled PT to increase their independence and safety with mobility to allow discharge to the venue listed below.  Pt participated minimally in PT eval, only agreeing to roll in the bed due to pain (pt did have pain meds prior to session) and perform LE therex.  Pt would like to go home, but question pt's insight into safety and ability to care for self due to short term memory deficits noted during evaluation.  At this time recommend SNF, but will continue to assess.  Pt did state that he hopes he can get all his antibiotics in the hospital, because if he has to take them at home he may forget to take them.   Follow Up Recommendations SNF    Equipment Recommendations  None recommended by PT    Recommendations for Other Services       Precautions / Restrictions Precautions Precautions: Fall Required Braces or Orthoses: Other Brace/Splint Other Brace/Splint: CAM boot  Restrictions Weight Bearing Restrictions: No Other Position/Activity Restrictions: Pt with CAM boot in room.  Pt states he sometimes wears it with gait.  He was unable to clarify its use.      Mobility  Bed Mobility Overal bed mobility: Needs Assistance Bed Mobility: Rolling Rolling: Supervision         General bed mobility comments: Rolled with S and rails with difficulty getting L arm across body to A with rolling. Pt refused to sit EOB.  Transfers                 General transfer comment: Pt refused  Ambulation/Gait                Stairs            Wheelchair Mobility    Modified Rankin (Stroke Patients Only)       Balance                                             Pertinent Vitals/Pain Pain Assessment: 0-10 Pain Score: 7  Pain Location: R stomach and back Pain Descriptors / Indicators: Sharp;Aching Pain Intervention(s): Premedicated before session    [place] expects to be discharged to:: Private residence Living Arrangements: Alone Available Help at Discharge: Friend(s);Available PRN/intermittently Type of Home: Apartment Home Access: Stairs to enter   Entrance Stairs-Number of Steps: 2 Home Layout: One level Home Equipment: Walker - 2 wheels      Prior Function Level of Independence: Independent               Hand Dominance        Extremity/Trunk Assessment        Lower Extremity Assessment Lower Extremity Assessment: Overall WFL for tasks assessed (limited assessment)       Communication   Communication: No difficulties  Cognition Arousal/Alertness: Awake/alert Behavior During Therapy: WFL for tasks assessed/performed Overall Cognitive Status: No family/caregiver present to determine baseline cognitive functioning  Area of Impairment: Memory;Problem solving;Safety/judgement                     Memory: Decreased short-term memory   Safety/Judgement: Decreased awareness of deficits;Decreased awareness of safety     General Comments: Pt with short term memory deficits and decreased insight and understanding.      General Comments      Exercises General Exercises - Lower Extremity Ankle Circles/Pumps: AROM;Both Heel Slides: AROM;Both;10 reps Straight Leg Raises: Both;5 reps   Assessment/Plan    PT Assessment Patient needs continued PT services  PT Problem List Decreased activity tolerance;Decreased mobility;Decreased cognition       PT Treatment Interventions DME instruction;Gait  training;Functional mobility training;Therapeutic activities;Therapeutic exercise;Balance training    PT Goals (Current goals can be found in the Care Plan section)  Acute Rehab PT Goals Patient Stated Goal: to go home PT Goal Formulation: With patient Time For Goal Achievement: 09/23/16 Potential to Achieve Goals: Good    Frequency Min 3X/week   Barriers to discharge Decreased caregiver support      Co-evaluation               AM-PAC PT "6 Clicks" Daily Activity  Outcome Measure Difficulty turning over in bed (including adjusting bedclothes, sheets and blankets)?: A Little Difficulty moving from lying on back to sitting on the side of the bed? : Total Difficulty sitting down on and standing up from a chair with arms (e.g., wheelchair, bedside commode, etc,.)?: Total Help needed moving to and from a bed to chair (including a wheelchair)?: Total Help needed walking in hospital room?: Total Help needed climbing 3-5 steps with a railing? : Total 6 Click Score: 8    End of Session   Activity Tolerance: Patient limited by pain Patient left: in bed Nurse Communication: Mobility status PT Visit Diagnosis: Other abnormalities of gait and mobility (R26.89)    Time: 0277-4128 PT Time Calculation (min) (ACUTE ONLY): 19 min   Charges:   PT Evaluation $PT Eval Low Complexity: 1 Procedure     PT G Codes:        [name], [place] Pager [phone] 09/09/2016   Galen Manila 09/09/2016, 11:19 AM

## 2016-09-09 NOTE — Patient Outreach (Signed)
[facility]) Care Management  09/09/2016  TADAN SHILL Oct 10, 1955 [id number]   Transition of Care Referral  Referral Date: 08/29/16 Referral Source: [facility] Referral Reason: "needs SW" Date of Admission: unknown Diagnosis: Right tibia fracture, renal stone removal s/p surgery Date of Discharge: 08/30/16 Facility: [address]: Medicare & Medicaid    Noted in patient's record patient readmitted to hospital on 09/06/16. Discharge plans is for patient to go to [facility], [place]. [facility] telephonic nurses do not follow patient in SNF. RN CM will close case out at this time. [facility] CSW will continue to follow patient.     Plan: RN CM will notify assigned [facility] SW that RN CM closing out case.    Enzo Montgomery, RN,BSN,CCM [place] [facility] Telephonic Care Management Coordinator Direct Phone: [phone] Toll Free: [phone] Fax: [phone]

## 2016-09-09 NOTE — Progress Notes (Signed)
Urology Inpatient Progress Report  Pyelonephritis [N12] Abdominal pain [R10.9]  Intv/Subj: No acute events overnight. Continues to have pain - requesting pain medications frequently Complaining of urgency and weak stream - limited mobility due to h/o fractured right leg and recent surgery, managed by condom catheter.  Active Problems:   Pyelonephritis  Current Facility-Administered Medications  Medication Dose Route Frequency Provider Last Rate Last Dose  . 0.9 %  sodium chloride infusion   Intravenous Continuous Cleon Gustin, MD 125 mL/hr at 09/09/16 0129    . acetaminophen (TYLENOL) tablet 650 mg  650 mg Oral Q4H PRN Cleon Gustin, MD      . diphenhydrAMINE (BENADRYL) injection 12.5-25 mg  12.5-25 mg Intravenous Q6H PRN Cleon Gustin, MD       Or  . diphenhydrAMINE (BENADRYL) 12.5 MG/5ML elixir 12.5-25 mg  12.5-25 mg Oral Q6H PRN Cleon Gustin, MD      . HYDROmorphone (DILAUDID) injection 0.5-1 mg  0.5-1 mg Intravenous Q2H PRN Cleon Gustin, MD   1 mg at 09/08/16 2018  . ondansetron (ZOFRAN) injection 4 mg  4 mg Intravenous Q4H PRN Cleon Gustin, MD   4 mg at 09/07/16 0735  . opium-belladonna (B&O SUPPRETTES) 16.2-60 MG suppository 1 suppository  1 suppository Rectal Q6H PRN Cleon Gustin, MD      . oxyCODONE-acetaminophen (PERCOCET/ROXICET) 5-325 MG per tablet 1-2 tablet  1-2 tablet Oral Q4H PRN Cleon Gustin, MD   2 tablet at 09/09/16 0129  . phenazopyridine (PYRIDIUM) tablet 200 mg  200 mg Oral TID PRN Cleon Gustin, MD   200 mg at 09/08/16 2114  . piperacillin-tazobactam (ZOSYN) IVPB 3.375 g  3.375 g Intravenous Q8H McKenzie, Candee Furbish, MD   Stopped at 09/09/16 [phone]  . polyethylene glycol (MIRALAX / GLYCOLAX) packet 17 g  17 g Oral Daily Cleon Gustin, MD   17 g at 09/08/16 1133  . tamsulosin (FLOMAX) capsule 0.4 mg  0.4 mg Oral Daily [name], MD      . vancomycin (VANCOCIN) IVPB 1000 mg/200 mL premix  1,000 mg  Intravenous Q24H Cleon Gustin, MD   Stopped at 09/08/16 2202  . zolpidem (AMBIEN) tablet 5 mg  5 mg Oral QHS PRN,MR X 1 McKenzie, Candee Furbish, MD   5 mg at 09/09/16 0010     Objective: Vital: Vitals:   09/08/16 0633 09/08/16 1324 09/08/16 2105 09/09/16 0525  BP: 132/79 140/78 (!) 149/81 (!) 165/94  Pulse: 76 70 83 93  Resp: 18 17 18 18   Temp: 97.5 F (36.4 C) 97.5 F (36.4 C) 98.7 F (37.1 C) 97.9 F (36.6 C)  TempSrc: Oral Oral Oral Oral  SpO2: 96% 98% 97% 99%  Weight:      Height:       I/Os: I/O last 3 completed shifts: In: 7472.5 [P.O.:460; I.V.:6312.5; IV Piggyback:700] Out: 2300 [Urine:2300]  Physical Exam:  General: Patient is in no apparent distress Lungs: Normal respiratory effort, chest expands symmetrically. GI: Incisions are c/d/i. the abdomen is soft and nontender without mass. Foley: straw colored  Ext: lower extremities symmetric  Lab Results:  Recent Labs  09/06/16 1540 09/07/16 0514  WBC 12.5* 10.9*  HGB 12.6* 11.3*  HCT 37.7* 35.2*    Recent Labs  09/06/16 1540 09/07/16 0514 09/08/16 0514 09/09/16 0727  NA 139 139  --   --   K 3.9 3.4*  --   --   CL 105 103  --   --  CO2 20* 24  --   --   GLUCOSE 114* 134*  --   --   BUN 46* 33*  --   --   CREATININE 2.25* 1.98* 1.44* 1.31*  CALCIUM 8.8* 8.4*  --   --    No results for input(s): LABPT, INR in the last 72 hours. No results for input(s): LABURIN in the last 72 hours. Results for orders placed or performed during the hospital encounter of 09/06/16  Urine culture     Status: Abnormal (Preliminary result)   Collection Time: 09/06/16  6:03 PM  Result Value Ref Range Status   Specimen Description URINE, CLEAN CATCH  Final   Special Requests NONE  Final   Culture (A)  Final    >=100,000 COLONIES/mL ENTEROCOCCUS FAECALIS SUSCEPTIBILITIES TO FOLLOW Performed at [facility], [address], [place], [place] [id number]    Report Status PENDING  Incomplete     Studies/Results: CT scan shows no urinoma or fluid collection  Assessment: Readmitted with pyelonephritis,   but improving.  Plan: Continue IV abx until sensitivities return - hopefully we can transition him this PM. Started flomax, continue condom catheter PRN PT/OT while inhouse Return to SNF Will remove stent after completing at least one week of ABX.  I will reschedule his appointment.   Louis Meckel, MD Urology 09/09/2016, 8:03 AM

## 2016-09-09 NOTE — Progress Notes (Signed)
PT Cancellation Note  Patient Details Name: [name] MRN: [id number] DOB: [birth year]   Cancelled Treatment:    Reason Eval/Treat Not Completed: Patient declined, no reason specified. Pt initially agreed to sit EOB, but then declined.  RN in to give meds, will check back.  Pt adamant that he wants to go home instead of SNF.  Explained purpose of PT and need to assess mobility to make sure he was safe if he wanted to go home.  Pt did not appear too receptive to what PT was saying.     Galen Manila 09/09/2016, 9:43 AM

## 2016-09-10 LAB — CBC
HCT: 35.1 % — ABNORMAL LOW (ref 39.0–52.0)
Hemoglobin: 11.5 g/dL — ABNORMAL LOW (ref 13.0–17.0)
MCH: 27.8 pg (ref 26.0–34.0)
MCHC: 32.8 g/dL (ref 30.0–36.0)
MCV: 84.8 fL (ref 78.0–100.0)
PLATELETS: 355 10*3/uL (ref 150–400)
RBC: 4.14 MIL/uL — ABNORMAL LOW (ref 4.22–5.81)
RDW: 13.5 % (ref 11.5–15.5)
WBC: 9 10*3/uL (ref 4.0–10.5)

## 2016-09-10 LAB — BASIC METABOLIC PANEL
Anion gap: 10 (ref 5–15)
BUN: 12 mg/dL (ref 6–20)
CO2: 22 mmol/L (ref 22–32)
CREATININE: 1.42 mg/dL — AB (ref 0.61–1.24)
Calcium: 8.3 mg/dL — ABNORMAL LOW (ref 8.9–10.3)
Chloride: 114 mmol/L — ABNORMAL HIGH (ref 101–111)
GFR, EST AFRICAN AMERICAN: 60 mL/min — AB (ref >60)
GFR, EST NON AFRICAN AMERICAN: 51 mL/min — AB (ref >60)
Glucose, Bld: 127 mg/dL — ABNORMAL HIGH (ref 65–99)
Potassium: 3.4 mmol/L — ABNORMAL LOW (ref 3.5–5.1)
SODIUM: 146 mmol/L — AB (ref 135–145)

## 2016-09-10 MED ORDER — MIRABEGRON ER 25 MG PO TB24
50.0000 mg | ORAL_TABLET | Freq: Every day | ORAL | Status: DC
  Administered 2016-09-10 – 2016-09-11 (×2): 50 mg via ORAL
  Filled 2016-09-10 (×2): qty 2

## 2016-09-10 MED ORDER — ACETAMINOPHEN 500 MG PO TABS: 1000.0000 mg | ORAL_TABLET | Freq: Four times a day (QID) | ORAL | Status: DC | PRN

## 2016-09-10 NOTE — Clinical Social Work Note (Signed)
Clinical Social Work Assessment  Patient Details  Name: [name] MRN: [id number] Date of Birth: [birth year]  Date of referral:  09/10/16               Reason for consult:  Facility Placement                Permission sought to share information with:  Facility Art therapist granted to share information::  Yes, Verbal Permission Granted  Name::        Agency::     Relationship::     Contact Information:     Housing/Transportation Living arrangements for the past 2 months:  Apartment, [place] (Patient discharged home from [facility] on 08/28/16.) Source of Information:  Patient, Facility Patient Interpreter Needed:  None Criminal Activity/Legal Involvement Pertinent to Current Situation/Hospitalization:  No - Comment as needed Significant Relationships:  Siblings, Friend Lives with:  Self, Facility Resident Do you feel safe going back to the place where you live?  Yes Need for family participation in patient care:  No (Coment)  Care giving concerns: PT recommending SNF for ST rehab. Per chart review patient discharged from [facility] SNF on 08/28/16.   Social Worker assessment / plan: Per chart review, patient accepted to [facility] SNF from the community. CSW contacted and confirmed patient's bed at [facility]. Staff member Tammy reported that patient was accepted from community and that the facility already had a FL2 and didn't need a new one. CSW and staff from SNF spoke with patient at bedside. CSW and patient discussed PT recommendation for SNF. Patient agreeable to SNF for ST rehab. CSW will continue to follow and assist with discharging planning.    Employment status:    Insurance information:  Medicare PT Recommendations:  [facility] / Referral to community resources:  [place]  Patient/Family's Response to care: Patient agreeable to SNF for ST rehab. Patient appreciative of SW assistance.    Patient/Family's Understanding of and Emotional Response to Diagnosis, Current Treatment, and Prognosis:  Patient reported that his goal is to return home after ST rehab at [facility]. Patient hopeful that he can return home and live independently. Patient verbalized understanding of plan to discharge to SNF for ST rehab.   Emotional Assessment Appearance:    Attitude/Demeanor/Rapport:    Affect (typically observed):  Calm Orientation:  Oriented to Self, Oriented to Place, Oriented to  Time, Oriented to Situation Alcohol / Substance use:  Not Applicable Psych involvement (Current and /or in the community):  No (Comment)  Discharge Needs  Concerns to be addressed:  No discharge needs identified Readmission within the last 30 days:  No Current discharge risk:  None Barriers to Discharge:  No Barriers Identified   Burnis Medin, LCSW 09/10/2016, 1:29 PM

## 2016-09-10 NOTE — Consult Note (Addendum)
   [facility] CM Inpatient Consult   09/10/2016  [name] 1955-10-22 [id number]    Attempts have been made by [place] Management team to reach patient prior to admit.   Please see patient outreach for further details.   Chart reviewed. Also spoke with inpatient LCSW to confirm discharge plans remains SNF- [facility] at discharge.   Spoke with [name] to discuss [place] Management program. He is agreeable and written consent obtained. Discussed that he will have follow up from [facility] at SNF. He endorses his plan will be for short term stay at SNF.   Confirmed best contact number as [phone].  [name] lives alone. Friend takes him to MD appointments. He denies having any concerns with obtaining medications.  Will alert [place] LCSW of [name] pending dc for short term to [facility].   [facility] Care Management packet and contact information provided.   Marthenia Rolling, MSN-Ed, RN,BSN [facility] Liaison [phone]

## 2016-09-10 NOTE — Care Management Important Message (Signed)
Important Message  Patient Details  Name: [name] MRN: [id number] Date of Birth: [birth year]   Medicare Important Message Given:  Yes    Kerin Salen 09/10/2016, 11:07 AM

## 2016-09-10 NOTE — Progress Notes (Addendum)
Urology Inpatient Progress Report  Pyelonephritis [N12] Abdominal pain [R10.9]  Intv/Subj: No acute events overnight. Not feeling well today - complaining of spasms and dysuria Had some nausea throughout day yesterday Refusing to participate in PT Slept poorly  Active Problems:   Pyelonephritis  Current Facility-Administered Medications  Medication Dose Route Frequency Provider Last Rate Last Dose  . acetaminophen (TYLENOL) tablet 1,000 mg  1,000 mg Oral Q6H PRN [name], MD      . amoxicillin-clavulanate (AUGMENTIN) 875-125 MG per tablet 1 tablet  1 tablet Oral Q12H [name], MD   1 tablet at 09/09/16 2113  . diphenhydrAMINE (BENADRYL) injection 12.5-25 mg  12.5-25 mg Intravenous Q6H PRN Cleon Gustin, MD       Or  . diphenhydrAMINE (BENADRYL) 12.5 MG/5ML elixir 12.5-25 mg  12.5-25 mg Oral Q6H PRN Cleon Gustin, MD      . HYDROmorphone (DILAUDID) injection 0.5-1 mg  0.5-1 mg Intravenous Q2H PRN Cleon Gustin, MD   1 mg at 09/09/16 2313  . mirabegron ER (MYRBETRIQ) tablet 50 mg  50 mg Oral Daily [name], MD      . ondansetron [facility]) injection 4 mg  4 mg Intravenous Q4H PRN Cleon Gustin, MD   4 mg at 09/07/16 0735  . opium-belladonna (B&O SUPPRETTES) 16.2-60 MG suppository 1 suppository  1 suppository Rectal Q6H PRN Cleon Gustin, MD      . oxyCODONE-acetaminophen (PERCOCET/ROXICET) 5-325 MG per tablet 1-2 tablet  1-2 tablet Oral Q4H PRN Cleon Gustin, MD   2 tablet at 09/10/16 0323  . polyethylene glycol (MIRALAX / GLYCOLAX) packet 17 g  17 g Oral Daily Cleon Gustin, MD   17 g at 09/09/16 0349  . tamsulosin (FLOMAX) capsule 0.4 mg  0.4 mg Oral Daily [name], MD   0.4 mg at 09/09/16 1791  . zolpidem (AMBIEN) tablet 5 mg  5 mg Oral QHS PRN,MR X 1 McKenzie, Candee Furbish, MD   5 mg at 09/09/16 0010     Objective: Vital: Vitals:   09/09/16 0525 09/09/16 1425 09/09/16 2136 09/10/16 0500  BP: (!)  165/94 (!) 159/81 138/80 (!) 154/84  Pulse: 93 90 70 72  Resp: 18 18 17 16   Temp: 97.9 F (36.6 C) 98.1 F (36.7 C) 97.8 F (36.6 C) 97.6 F (36.4 C)  TempSrc: Oral Oral Oral Oral  SpO2: 99% 98% 97% 98%  Weight:      Height:       I/Os: I/O last 3 completed shifts: In: 4800 [I.V.:4500; IV Piggyback:300] Out: 5056 [Urine:1650]  Physical Exam:  General: Patient is in no apparent distress Lungs: Normal respiratory effort, chest expands symmetrically. GI: Incisions are c/d/i. the abdomen is soft and nontender without mass. Foley: straw colored  Ext: lower extremities symmetric  Lab Results:  Recent Labs  09/10/16 0445  WBC 9.0  HGB 11.5*  HCT 35.1*    Recent Labs  09/08/16 0514 09/09/16 0727 09/10/16 0445  NA  --   --  146*  K  --   --  3.4*  CL  --   --  114*  CO2  --   --  22  GLUCOSE  --   --  127*  BUN  --   --  12  CREATININE 1.44* 1.31* 1.42*  CALCIUM  --   --  8.3*   No results for input(s): LABPT, INR in the last 72 hours. No results for input(s): LABURIN in  the last 72 hours. Results for orders placed or performed during the hospital encounter of 09/06/16  Urine culture     Status: Abnormal   Collection Time: 09/06/16  6:03 PM  Result Value Ref Range Status   Specimen Description URINE, CLEAN CATCH  Final   Special Requests NONE  Final   Culture >=100,000 COLONIES/mL ENTEROCOCCUS FAECALIS (A)  Final   Report Status 09/09/2016 FINAL  Final   Organism ID, Bacteria ENTEROCOCCUS FAECALIS (A)  Final      Susceptibility   Enterococcus faecalis - MIC*    AMPICILLIN <=2 SENSITIVE Sensitive     LEVOFLOXACIN >=8 RESISTANT Resistant     NITROFURANTOIN <=16 SENSITIVE Sensitive     VANCOMYCIN 1 SENSITIVE Sensitive     * >=100,000 COLONIES/mL ENTEROCOCCUS FAECALIS    Studies/Results: CT scan shows no urinoma or fluid collection  Assessment: Readmitted with pyelonephritis,   but improving. Bladder spasms and dysuria.  Plan: Transitioned to  Augmentin PT/OT while inhouse - told patient that he needs to participate for appropriate evaluation Started Myrbetriq for spasms - has B&O suppository Flomax for obstructive voiding symptoms Not likely to discharge patient today - given questions around tolerance of abx, poorly controlled pain/spasm and potential for bounce back.  Instead will try and optimize for discharge tomorrow. Return to SNF Will remove stent after completing at least one week of ABX.  I will reschedule his appointment.   Louis Meckel, MD Urology 09/10/2016, 7:54 AM

## 2016-09-11 DIAGNOSIS — S3710XA Unspecified injury of ureter, initial encounter: Secondary | ICD-10-CM | POA: Diagnosis not present

## 2016-09-11 DIAGNOSIS — Z8612 Personal history of poliomyelitis: Secondary | ICD-10-CM | POA: Diagnosis not present

## 2016-09-11 DIAGNOSIS — N209 Urinary calculus, unspecified: Secondary | ICD-10-CM | POA: Diagnosis not present

## 2016-09-11 DIAGNOSIS — M6281 Muscle weakness (generalized): Secondary | ICD-10-CM | POA: Diagnosis not present

## 2016-09-11 DIAGNOSIS — E785 Hyperlipidemia, unspecified: Secondary | ICD-10-CM | POA: Diagnosis not present

## 2016-09-11 DIAGNOSIS — E1165 Type 2 diabetes mellitus with hyperglycemia: Secondary | ICD-10-CM | POA: Diagnosis not present

## 2016-09-11 DIAGNOSIS — R2681 Unsteadiness on feet: Secondary | ICD-10-CM | POA: Diagnosis not present

## 2016-09-11 DIAGNOSIS — N10 Acute tubulo-interstitial nephritis: Secondary | ICD-10-CM | POA: Diagnosis not present

## 2016-09-11 DIAGNOSIS — F339 Major depressive disorder, recurrent, unspecified: Secondary | ICD-10-CM | POA: Diagnosis not present

## 2016-09-11 DIAGNOSIS — S3710XD Unspecified injury of ureter, subsequent encounter: Secondary | ICD-10-CM | POA: Diagnosis not present

## 2016-09-11 DIAGNOSIS — N200 Calculus of kidney: Secondary | ICD-10-CM | POA: Diagnosis not present

## 2016-09-11 DIAGNOSIS — Z8546 Personal history of malignant neoplasm of prostate: Secondary | ICD-10-CM | POA: Diagnosis not present

## 2016-09-11 DIAGNOSIS — E1169 Type 2 diabetes mellitus with other specified complication: Secondary | ICD-10-CM | POA: Diagnosis not present

## 2016-09-11 DIAGNOSIS — D509 Iron deficiency anemia, unspecified: Secondary | ICD-10-CM | POA: Diagnosis not present

## 2016-09-11 DIAGNOSIS — K625 Hemorrhage of anus and rectum: Secondary | ICD-10-CM | POA: Diagnosis not present

## 2016-09-11 DIAGNOSIS — R7309 Other abnormal glucose: Secondary | ICD-10-CM | POA: Diagnosis not present

## 2016-09-11 DIAGNOSIS — N12 Tubulo-interstitial nephritis, not specified as acute or chronic: Secondary | ICD-10-CM | POA: Diagnosis not present

## 2016-09-11 DIAGNOSIS — F329 Major depressive disorder, single episode, unspecified: Secondary | ICD-10-CM | POA: Diagnosis not present

## 2016-09-11 DIAGNOSIS — S82201D Unspecified fracture of shaft of right tibia, subsequent encounter for closed fracture with routine healing: Secondary | ICD-10-CM | POA: Diagnosis not present

## 2016-09-11 DIAGNOSIS — I10 Essential (primary) hypertension: Secondary | ICD-10-CM | POA: Diagnosis not present

## 2016-09-11 MED ORDER — MIRABEGRON ER 50 MG PO TB24: 50.0000 mg | ORAL_TABLET | Freq: Every day | ORAL | 0 refills | Status: DC

## 2016-09-11 MED ORDER — AMOXICILLIN-POT CLAVULANATE 875-125 MG PO TABS: 1.0000 | ORAL_TABLET | Freq: Two times a day (BID) | ORAL | 0 refills | Status: AC

## 2016-09-11 MED ORDER — HYDROCODONE-ACETAMINOPHEN 5-325 MG PO TABS: 1.0000 | ORAL_TABLET | Freq: Four times a day (QID) | ORAL | 0 refills | Status: DC | PRN

## 2016-09-11 MED ORDER — TAMSULOSIN HCL 0.4 MG PO CAPS: 0.4000 mg | ORAL_CAPSULE | Freq: Every day | ORAL | 0 refills | Status: DC

## 2016-09-11 NOTE — Discharge Instructions (Signed)
Patient should participate fully in physical therapy, he has no restrictions from surgical standpoint.  Take all new medications as prescribed.  Contact Dr. Carlton Adam office for concerns of infection, worsening pain, hematuria or any additional questions or concerns.

## 2016-09-11 NOTE — Discharge Summary (Signed)
Date of admission: 09/06/2016  Date of discharge: 09/11/2016  Admission diagnosis: complicated UTI, failure to thrive, nausea/vomitting  Discharge diagnosis: as above  Secondary diagnoses:  Patient Active Problem List   Diagnosis Date Noted  . Pyelonephritis 09/06/2016  . Nephrolithiasis 08/01/2016  . Ureteral trauma, subsequent encounter 08/01/2016  . Rectal bleeding 07/31/2016  . Uncontrolled type 2 diabetes mellitus with hyperglycemia ([place]) 07/31/2016  . Urolithiasis 07/31/2016  . Right ureteral stone   . Type 2 diabetes mellitus without complication, without long-term current use of insulin ([place]) 05/29/2016  . Dyslipidemia associated with type 2 diabetes mellitus ([place]) 05/29/2016  . Closed nondisplaced spiral fracture of shaft of right tibia   . Right tibial fracture 03/22/2016  . Microcytic anemia 02/17/2012  . Prostate cancer ([place]) 02/17/2012  . 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 Benign hypertension 02/17/2012  . Depressive disorder 02/17/2012    Procedures performed:   History and Physical: For full details, please see admission history and physical. Briefly, [name] is a 61 y.o. year old patient with right ureteral Reconstruction due to ureteral injury during ureteroscopy for obstructing ureteral stones on 08/01/16. His postoperative course is been relatively unremarkable, but he did over the last several weeks to develop worsening urinary tract symptoms with associated lower abdominal pain. He was subjectively febrile at his skilled nursing facility, there were no fevers documented in the emergency department. He was admitted for antibiotics and rehydration.   Hospital Course: The patient was admitted to the hospital from the emergency department and placed on broad-spectrum antibiotics. A urine culture was sent and returned with enterococcus. Once the split sensitivities of the culture returned he was placed on Augmentin. He was monitored for 36 hours after being  placed on Augmentin with no subsequent fevers.  Throughout the patient's hospitalization he was having some lower abdominal pain with some bladder spasms. He was having frequent urination and some urge incontinence. He was started on myrbetriq as well as Flomax for his symptoms.  In addition, the patient was able to tolerate his meals without significant nausea and vomiting. The symptoms seem to have resolved over the course of his hospitalization.  The patient did have a PT evaluation, although it was limited because the patient was unwilling to work with them in any meaningful way. Laboratory values:   Recent Labs  09/10/16 0445  WBC 9.0  HGB 11.5*  HCT 35.1*    Recent Labs  09/09/16 0727 09/10/16 0445  NA  --  146*  K  --  3.4*  CL  --  114*  CO2  --  22  GLUCOSE  --  127*  BUN  --  12  CREATININE 1.31* 1.42*  CALCIUM  --  8.3*   No results for input(s): LABPT, INR in the last 72 hours. No results for input(s): LABURIN in the last 72 hours. Results for orders placed or performed during the hospital encounter of 09/06/16  Urine culture     Status: Abnormal   Collection Time: 09/06/16  6:03 PM  Result Value Ref Range Status   Specimen Description URINE, CLEAN CATCH  Final   Special Requests NONE  Final   Culture >=100,000 COLONIES/mL ENTEROCOCCUS FAECALIS (A)  Final   Report Status 09/09/2016 FINAL  Final   Organism ID, Bacteria ENTEROCOCCUS FAECALIS (A)  Final      Susceptibility   Enterococcus faecalis - MIC*    AMPICILLIN <=2 SENSITIVE Sensitive     LEVOFLOXACIN >=8 RESISTANT Resistant  NITROFURANTOIN <=16 SENSITIVE Sensitive     VANCOMYCIN 1 SENSITIVE Sensitive     * >=100,000 COLONIES/mL ENTEROCOCCUS FAECALIS    Disposition: Home  Discharge instruction: The patient was instructed to be ambulatory but told to refrain from heavy lifting, strenuous activity, or driving.   Discharge medications: Allergies as of 09/11/2016   No Known Allergies      Medication List    STOP taking these medications   levofloxacin 500 MG tablet Commonly known as:  LEVAQUIN     TAKE these medications   acetaminophen 500 MG tablet Commonly known as:  TYLENOL Take 500 mg by mouth every 6 (six) hours as needed for mild pain or moderate pain.   amLODipine 10 MG tablet Commonly known as:  NORVASC Take 10 mg by mouth daily.   amoxicillin-clavulanate 875-125 MG tablet Commonly known as:  AUGMENTIN Take 1 tablet by mouth every 12 (twelve) hours.   aspirin EC 81 MG tablet Take 81 mg by mouth daily.   atorvastatin 20 MG tablet Commonly known as:  LIPITOR Take 20 mg by mouth daily.   HYDROcodone-acetaminophen 5-325 MG tablet Commonly known as:  NORCO/VICODIN Take 1-2 tablets by mouth every 6 (six) hours as needed for moderate pain (Per Dr. Brett Fairy). What changed:  how much to take   LORazepam 0.5 MG tablet Commonly known as:  ATIVAN Take 0.5 mg by mouth 2 (two) times daily as needed for anxiety.   losartan-hydrochlorothiazide 100-25 MG tablet Commonly known as:  HYZAAR Take 1 tablet by mouth daily.   metFORMIN 1000 MG tablet Commonly known as:  GLUCOPHAGE Take 1,000 mg by mouth 2 (two) times daily with a meal.   metoprolol tartrate 25 MG tablet Commonly known as:  LOPRESSOR Take 50 mg by mouth daily.   mirabegron ER 50 MG Tb24 tablet Commonly known as:  MYRBETRIQ Take 1 tablet (50 mg total) by mouth daily.   multivitamin tablet Take 1 tablet by mouth daily.   polyethylene glycol packet Commonly known as:  MIRALAX / GLYCOLAX Take 17 g by mouth daily. Stop taking if having more than 2 soft/loose bowel movements daily.   promethazine 25 MG tablet Commonly known as:  PHENERGAN Take 25 mg by mouth every 8 (eight) hours as needed for nausea or vomiting.   senna-docusate 8.6-50 MG tablet Commonly known as:  Senokot-S Take 1 tablet by mouth at bedtime.   sertraline 25 MG tablet Commonly known as:  ZOLOFT Take 50 mg by mouth  daily.   tamsulosin 0.4 MG Caps capsule Commonly known as:  FLOMAX Take 1 capsule (0.4 mg total) by mouth daily.       Followup:  Follow-up Information    [name], MD On 09/16/2016.   Specialty:  Urology Why:  Stent removal - patient's facility will be contacted with the time of appointment, likely around 12pm. Contact information: [place] [address] [phone]

## 2016-09-11 NOTE — Progress Notes (Signed)
Urology Inpatient Progress Report  Pyelonephritis [N12] Abdominal pain [R10.9]  Intv/Subj: No acute events overnight. Still having trouble making it to the bathroom ontime Feels lousy - nauseous, but tolerating food.  Active Problems:   Pyelonephritis  Current Facility-Administered Medications  Medication Dose Route Frequency Provider Last Rate Last Dose  . acetaminophen (TYLENOL) tablet 1,000 mg  1,000 mg Oral Q6H PRN [name], MD      . amoxicillin-clavulanate (AUGMENTIN) 875-125 MG per tablet 1 tablet  1 tablet Oral Q12H [name], MD   1 tablet at 09/10/16 2141  . diphenhydrAMINE (BENADRYL) injection 12.5-25 mg  12.5-25 mg Intravenous Q6H PRN Cleon Gustin, MD       Or  . diphenhydrAMINE (BENADRYL) 12.5 MG/5ML elixir 12.5-25 mg  12.5-25 mg Oral Q6H PRN Cleon Gustin, MD      . HYDROmorphone (DILAUDID) injection 0.5-1 mg  0.5-1 mg Intravenous Q2H PRN Cleon Gustin, MD   1 mg at 09/10/16 2141  . mirabegron ER (MYRBETRIQ) tablet 50 mg  50 mg Oral Daily [name], MD   50 mg at 09/10/16 1000  . ondansetron (ZOFRAN) injection 4 mg  4 mg Intravenous Q4H PRN Cleon Gustin, MD   4 mg at 09/10/16 1637  . opium-belladonna (B&O SUPPRETTES) 16.2-60 MG suppository 1 suppository  1 suppository Rectal Q6H PRN Cleon Gustin, MD   1 suppository at 09/10/16 1515  . oxyCODONE-acetaminophen (PERCOCET/ROXICET) 5-325 MG per tablet 1-2 tablet  1-2 tablet Oral Q4H PRN Cleon Gustin, MD   2 tablet at 09/10/16 1515  . polyethylene glycol (MIRALAX / GLYCOLAX) packet 17 g  17 g Oral Daily Cleon Gustin, MD   17 g at 09/10/16 1000  . tamsulosin (FLOMAX) capsule 0.4 mg  0.4 mg Oral Daily [name], MD   0.4 mg at 09/10/16 0853  . zolpidem (AMBIEN) tablet 5 mg  5 mg Oral QHS PRN,MR X 1 McKenzie, Candee Furbish, MD   5 mg at 09/09/16 0010     Objective: Vital: Vitals:   09/10/16 0500 09/10/16 1500 09/10/16 2104 09/11/16 0452  BP:  (!) 154/84 135/77 (!) 142/80 (!) 161/90  Pulse: 72 85 64 77  Resp: 16 18 18 18   Temp: 97.6 F (36.4 C) 97.8 F (36.6 C) 97.7 F (36.5 C) 97.9 F (36.6 C)  TempSrc: Oral Oral Oral Oral  SpO2: 98% 98% 97% 99%  Weight:      Height:       I/Os: I/O last 3 completed shifts: In: 3057.5 [P.O.:120; I.V.:2937.5] Out: 5638 [Urine:1650]  Physical Exam:  General: Patient is in no apparent distress Lungs: Normal respiratory effort, chest expands symmetrically. GI: Incisions are c/d/i. the abdomen is soft and nontender without mass. Ext: lower extremities symmetric  Lab Results:  Recent Labs  09/10/16 0445  WBC 9.0  HGB 11.5*  HCT 35.1*    Recent Labs  09/09/16 0727 09/10/16 0445  NA  --  146*  K  --  3.4*  CL  --  114*  CO2  --  22  GLUCOSE  --  127*  BUN  --  12  CREATININE 1.31* 1.42*  CALCIUM  --  8.3*   No results for input(s): LABPT, INR in the last 72 hours. No results for input(s): LABURIN in the last 72 hours. Results for orders placed or performed during the hospital encounter of 09/06/16  Urine culture     Status: Abnormal   Collection Time: 09/06/16  6:03 PM  Result Value Ref Range Status   Specimen Description URINE, CLEAN CATCH  Final   Special Requests NONE  Final   Culture >=100,000 COLONIES/mL ENTEROCOCCUS FAECALIS (A)  Final   Report Status 09/09/2016 FINAL  Final   Organism ID, Bacteria ENTEROCOCCUS FAECALIS (A)  Final      Susceptibility   Enterococcus faecalis - MIC*    AMPICILLIN <=2 SENSITIVE Sensitive     LEVOFLOXACIN >=8 RESISTANT Resistant     NITROFURANTOIN <=16 SENSITIVE Sensitive     VANCOMYCIN 1 SENSITIVE Sensitive     * >=100,000 COLONIES/mL ENTEROCOCCUS FAECALIS    Studies/Results: CT scan shows no urinoma or fluid collection  Assessment: Readmitted with complicated UTI and failure to thrive,   but improving. Bladder spasms and dysuria.  Plan: I am planning to discharge Tirth back to his SNF. F/u to be scheduled on  Monday   Louis Meckel, MD Urology 09/11/2016, 5:59 AM

## 2016-09-11 NOTE — Progress Notes (Signed)
Patient accepted to [facility] from community. Patient will discharge to [facility]. Patient reported that his friend (Mel Laurin Coder [phone]) will pick him up and take him to SNF. CSW contacted patient's friend and confirmed that he will transport patient to SNF, patient's friend reported that he would pick patient up in 20-30 minutes. Patient's RN can call report to [phone], patient going to room 158B. CSW signing off, no other needs identified at this time. Please consult if new needs arise.   Abundio Miu, [place] Social Worker [facility] Cell#: [phone]

## 2016-09-11 NOTE — Progress Notes (Addendum)
PT discharged with friend to [facility]. Educated friend to give DC envelope with medication list to nurse at [facility]. Verbalized understanding. Pt apologetic for earlier behavior or name calling to staff. Pt left with no s/sx distress. Pain is 3/10 and tolerable. Left with all belongings.  Called and gave report to nurse [place] at [facility]

## 2016-09-16 DIAGNOSIS — S3710XA Unspecified injury of ureter, initial encounter: Secondary | ICD-10-CM | POA: Diagnosis not present

## 2016-09-17 ENCOUNTER — Other Ambulatory Visit: Payer: Self-pay | Admitting: *Deleted

## 2016-09-17 DIAGNOSIS — N12 Tubulo-interstitial nephritis, not specified as acute or chronic: Secondary | ICD-10-CM | POA: Diagnosis not present

## 2016-09-17 DIAGNOSIS — R7309 Other abnormal glucose: Secondary | ICD-10-CM | POA: Diagnosis not present

## 2016-09-17 DIAGNOSIS — N209 Urinary calculus, unspecified: Secondary | ICD-10-CM | POA: Diagnosis not present

## 2016-09-17 DIAGNOSIS — E1165 Type 2 diabetes mellitus with hyperglycemia: Secondary | ICD-10-CM | POA: Diagnosis not present

## 2016-09-19 ENCOUNTER — Encounter: Payer: Self-pay | Admitting: *Deleted

## 2016-09-19 NOTE — Patient Outreach (Signed)
[facility]) Care Management  09/19/2016  [name] 08-24-1955 [id number]   CSW was able to meet with patient today at [facility], [place] where patient currently resides to receive short-term rehabilitative services.  CSW introduced self, explained role and types of services provided through [facility].  CSW was able to obtain two HIPAA compliant identifiers from patient, which included patient's name and date of birth. Patient did not appear to be in good spirits today, not wanting to meet with CSW or have CSW complete the assessment with him. Patient was somewhat argumentative, indicating that he simply plans to return home to live in his apartment at time of discharge from the skilled facility. CSW agreed to assist patient with discharge planning needs, but patient did not appear to be receptive. CSW will meet with patient for a routine visit on Thursday, July 19th, at which time CSW will assist patient with deciding upon a home health agency of choice, for both home health services, as well as durable medical equipment.  Patient voiced understanding and was agreeable to this plan. Nat Christen, BSW, MSW, LCSW  Licensed Education officer, [facility]  M[address], [place], [place] [address] Physical Address-[address], [place], [place] [address] Toll Free Main # [phone] Fax # [phone] Cell # [phone]  Office # [phone] [email or web address]

## 2016-09-25 DIAGNOSIS — E785 Hyperlipidemia, unspecified: Secondary | ICD-10-CM | POA: Diagnosis not present

## 2016-09-25 DIAGNOSIS — S82201D Unspecified fracture of shaft of right tibia, subsequent encounter for closed fracture with routine healing: Secondary | ICD-10-CM | POA: Diagnosis not present

## 2016-09-25 DIAGNOSIS — Z7984 Long term (current) use of oral hypoglycemic drugs: Secondary | ICD-10-CM | POA: Diagnosis not present

## 2016-09-25 DIAGNOSIS — F339 Major depressive disorder, recurrent, unspecified: Secondary | ICD-10-CM | POA: Diagnosis not present

## 2016-09-25 DIAGNOSIS — I10 Essential (primary) hypertension: Secondary | ICD-10-CM | POA: Diagnosis not present

## 2016-09-25 DIAGNOSIS — R32 Unspecified urinary incontinence: Secondary | ICD-10-CM | POA: Diagnosis not present

## 2016-09-25 DIAGNOSIS — Z9181 History of falling: Secondary | ICD-10-CM | POA: Diagnosis not present

## 2016-09-25 DIAGNOSIS — E119 Type 2 diabetes mellitus without complications: Secondary | ICD-10-CM | POA: Diagnosis not present

## 2016-09-25 DIAGNOSIS — Z8546 Personal history of malignant neoplasm of prostate: Secondary | ICD-10-CM | POA: Diagnosis not present

## 2016-09-25 DIAGNOSIS — Z8512 Personal history of malignant neoplasm of trachea: Secondary | ICD-10-CM | POA: Diagnosis not present

## 2016-09-25 DIAGNOSIS — D509 Iron deficiency anemia, unspecified: Secondary | ICD-10-CM | POA: Diagnosis not present

## 2016-09-25 DIAGNOSIS — Z7982 Long term (current) use of aspirin: Secondary | ICD-10-CM | POA: Diagnosis not present

## 2016-09-30 ENCOUNTER — Emergency Department (HOSPITAL_COMMUNITY)
Admission: EM | Admit: 2016-09-30 | Discharge: 2016-10-01 | Disposition: A | Discharge status: Home / Self Care | Payer: Medicare Other | Attending: Emergency Medicine | Admitting: Emergency Medicine

## 2016-09-30 ENCOUNTER — Emergency Department (HOSPITAL_COMMUNITY): Payer: Medicare Other

## 2016-09-30 ENCOUNTER — Encounter (HOSPITAL_COMMUNITY): Payer: Self-pay | Admitting: Obstetrics and Gynecology

## 2016-09-30 DIAGNOSIS — R319 Hematuria, unspecified: Secondary | ICD-10-CM | POA: Insufficient documentation

## 2016-09-30 DIAGNOSIS — N134 Hydroureter: Secondary | ICD-10-CM | POA: Insufficient documentation

## 2016-09-30 DIAGNOSIS — Z87442 Personal history of urinary calculi: Secondary | ICD-10-CM | POA: Insufficient documentation

## 2016-09-30 DIAGNOSIS — N131 Hydronephrosis with ureteral stricture, not elsewhere classified: Secondary | ICD-10-CM | POA: Diagnosis not present

## 2016-09-30 DIAGNOSIS — Z79899 Other long term (current) drug therapy: Secondary | ICD-10-CM | POA: Diagnosis not present

## 2016-09-30 DIAGNOSIS — E119 Type 2 diabetes mellitus without complications: Secondary | ICD-10-CM | POA: Diagnosis not present

## 2016-09-30 DIAGNOSIS — N201 Calculus of ureter: Secondary | ICD-10-CM

## 2016-09-30 DIAGNOSIS — I10 Essential (primary) hypertension: Secondary | ICD-10-CM | POA: Diagnosis not present

## 2016-09-30 DIAGNOSIS — Z7984 Long term (current) use of oral hypoglycemic drugs: Secondary | ICD-10-CM | POA: Diagnosis not present

## 2016-09-30 DIAGNOSIS — R100 Acute abdomen: Secondary | ICD-10-CM | POA: Diagnosis not present

## 2016-09-30 DIAGNOSIS — N200 Calculus of kidney: Secondary | ICD-10-CM | POA: Diagnosis not present

## 2016-09-30 DIAGNOSIS — Z8546 Personal history of malignant neoplasm of prostate: Secondary | ICD-10-CM | POA: Insufficient documentation

## 2016-09-30 DIAGNOSIS — R1031 Right lower quadrant pain: Secondary | ICD-10-CM | POA: Diagnosis present

## 2016-09-30 DIAGNOSIS — R52 Pain, unspecified: Secondary | ICD-10-CM | POA: Diagnosis not present

## 2016-09-30 LAB — URINALYSIS, ROUTINE W REFLEX MICROSCOPIC
Bacteria, UA: NONE SEEN
Bilirubin Urine: NEGATIVE
GLUCOSE, UA: NEGATIVE mg/dL
Ketones, ur: NEGATIVE mg/dL
NITRITE: NEGATIVE
PH: 5 (ref 5.0–8.0)
PROTEIN: 30 mg/dL — AB
Specific Gravity, Urine: 1.01 (ref 1.005–1.030)
Squamous Epithelial / HPF: NONE SEEN

## 2016-09-30 LAB — CBC WITH DIFFERENTIAL/PLATELET
BASOS ABS: 0 10*3/uL (ref 0.0–0.1)
BASOS PCT: 0 %
EOS ABS: 0.3 10*3/uL (ref 0.0–0.7)
Eosinophils Relative: 3 %
HCT: 29.6 % — ABNORMAL LOW (ref 39.0–52.0)
HEMOGLOBIN: 10.2 g/dL — AB (ref 13.0–17.0)
Lymphocytes Relative: 15 %
Lymphs Abs: 1.6 10*3/uL (ref 0.7–4.0)
MCH: 27.7 pg (ref 26.0–34.0)
MCHC: 34.5 g/dL (ref 30.0–36.0)
MCV: 80.4 fL (ref 78.0–100.0)
Monocytes Absolute: 1.1 10*3/uL — ABNORMAL HIGH (ref 0.1–1.0)
Monocytes Relative: 10 %
NEUTROS PCT: 72 %
Neutro Abs: 7.9 10*3/uL — ABNORMAL HIGH (ref 1.7–7.7)
Platelets: 336 10*3/uL (ref 150–400)
RBC: 3.68 MIL/uL — AB (ref 4.22–5.81)
RDW: 14.1 % (ref 11.5–15.5)
WBC: 10.9 10*3/uL — AB (ref 4.0–10.5)

## 2016-09-30 LAB — BASIC METABOLIC PANEL
ANION GAP: 13 (ref 5–15)
BUN: 23 mg/dL — ABNORMAL HIGH (ref 6–20)
CALCIUM: 8.6 mg/dL — AB (ref 8.9–10.3)
CO2: 25 mmol/L (ref 22–32)
CREATININE: 1.91 mg/dL — AB (ref 0.61–1.24)
Chloride: 102 mmol/L (ref 101–111)
GFR calc non Af Amer: 36 mL/min — ABNORMAL LOW (ref >60)
GFR, EST AFRICAN AMERICAN: 42 mL/min — AB (ref >60)
Glucose, Bld: 163 mg/dL — ABNORMAL HIGH (ref 65–99)
Potassium: 3.2 mmol/L — ABNORMAL LOW (ref 3.5–5.1)
SODIUM: 140 mmol/L (ref 135–145)

## 2016-09-30 MED ORDER — ONDANSETRON HCL 4 MG/2ML IJ SOLN
4.0000 mg | Freq: Once | INTRAMUSCULAR | Status: AC
  Administered 2016-09-30: 4 mg via INTRAVENOUS
  Filled 2016-09-30: qty 2

## 2016-09-30 MED ORDER — MORPHINE SULFATE (PF) 4 MG/ML IV SOLN
4.0000 mg | Freq: Once | INTRAVENOUS | Status: AC
  Administered 2016-09-30: 4 mg via INTRAVENOUS
  Filled 2016-09-30: qty 1

## 2016-09-30 MED ORDER — SODIUM CHLORIDE 0.9 % IV BOLUS (SEPSIS)
1000.0000 mL | Freq: Once | INTRAVENOUS | Status: AC
  Administered 2016-09-30: 1000 mL via INTRAVENOUS

## 2016-09-30 NOTE — ED Notes (Signed)
Bed: WA02 Expected date:  Expected time:  Means of arrival:  Comments: EMS 61 yo male from home-possible UTI/kidney stone-right flank pain since 1300-hx of same-dark urine with burning

## 2016-10-01 ENCOUNTER — Other Ambulatory Visit: Payer: Self-pay | Admitting: *Deleted

## 2016-10-01 DIAGNOSIS — E119 Type 2 diabetes mellitus without complications: Secondary | ICD-10-CM | POA: Diagnosis not present

## 2016-10-01 DIAGNOSIS — F339 Major depressive disorder, recurrent, unspecified: Secondary | ICD-10-CM | POA: Diagnosis not present

## 2016-10-01 DIAGNOSIS — D509 Iron deficiency anemia, unspecified: Secondary | ICD-10-CM | POA: Diagnosis not present

## 2016-10-01 DIAGNOSIS — E785 Hyperlipidemia, unspecified: Secondary | ICD-10-CM | POA: Diagnosis not present

## 2016-10-01 DIAGNOSIS — I10 Essential (primary) hypertension: Secondary | ICD-10-CM | POA: Diagnosis not present

## 2016-10-01 DIAGNOSIS — N201 Calculus of ureter: Secondary | ICD-10-CM | POA: Diagnosis not present

## 2016-10-01 DIAGNOSIS — S82201D Unspecified fracture of shaft of right tibia, subsequent encounter for closed fracture with routine healing: Secondary | ICD-10-CM | POA: Diagnosis not present

## 2016-10-01 MED ORDER — HYDROCODONE-ACETAMINOPHEN 5-325 MG PO TABS
2.0000 | ORAL_TABLET | Freq: Once | ORAL | Status: AC
  Administered 2016-10-01: 2 via ORAL
  Filled 2016-10-01: qty 2

## 2016-10-01 MED ORDER — SODIUM CHLORIDE 0.9 % IV BOLUS (SEPSIS)
1000.0000 mL | Freq: Once | INTRAVENOUS | Status: AC
  Administered 2016-10-01: 1000 mL via INTRAVENOUS

## 2016-10-01 MED ORDER — HYDROCODONE-ACETAMINOPHEN 5-325 MG PO TABS: 1.0000 | ORAL_TABLET | ORAL | 0 refills | Status: DC | PRN

## 2016-10-01 NOTE — ED Provider Notes (Signed)
[facility] DEPT Provider Note   CSN: [id number] Arrival date & time: 09/30/16  1928     History   Chief Complaint Chief Complaint  Patient presents with  . Flank Pain    HPI JR MILLIRON is a 61 y.o. male.  HPI  61 year old male with a history of prior ureteral stones with recent complicated course including recent cystoscopy that then turned into a full laparotomy with ureteral replacement subsequently followed by pyelonephritis presents with left flank pain. He states this started at noon today. He states it feels similar to prior kidney stones. His prior issues were on the right side. This pain is essentially constant although it does seem to get better and worse and he can't find a comfortable position. The pain is a severe, 9/10. It is mostly in his back. He vomited once when it first started, no vomiting since. He has noticed blood in his urine. He's had difficulty urinating ever since the original procedure but this has not been new or different. No dysuria. Denies any fevers.  Past Medical History:  Diagnosis Date  . Depression   . GERD (gastroesophageal reflux disease)   . Hemorrhoids   . Hypertension   . Macular degeneration   . Microcytic anemia   . Prostate cancer ([place]) 08/2009  . Tubular adenoma     Patient Active Problem List   Diagnosis Date Noted  . Complicated urinary tract infection 09/06/2016  . Nephrolithiasis 08/01/2016  . Ureteral trauma, subsequent encounter 08/01/2016  . Rectal bleeding 07/31/2016  . Uncontrolled type 2 diabetes mellitus with hyperglycemia ([place]) 07/31/2016  . Urolithiasis 07/31/2016  . Right ureteral stone   . Type 2 diabetes mellitus without complication, without long-term current use of insulin ([facility]) 05/29/2016  . Dyslipidemia associated with type 2 diabetes mellitus ([place]) 05/29/2016  . Closed nondisplaced spiral fracture of shaft of right tibia   . Right tibial fracture 03/22/2016  . Microcytic anemia 02/17/2012  .  Prostate cancer ([facility]) 02/17/2012  . 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 Benign hypertension 02/17/2012  . Depressive disorder 02/17/2012    Past Surgical History:  Procedure Laterality Date  . CYSTOSCOPY WITH RETROGRADE PYELOGRAM, URETEROSCOPY AND STENT PLACEMENT Right 07/31/2016   Procedure: CYSTOSCOPY WITH RIGHT  RETROGRADE PYELOGRAM, URETEROSCOPY;  Surgeon: [name], MD;  Location: [facility];  Service: Urology;  Laterality: Right;  . INGUINAL HERNIA REPAIR    . Left ankle joint fusion  1981  . PROSTATE BIOPSY     x 2  . URETERAL REIMPLANTION  07/31/2016   Procedure: URETERAL REIMPLANT, right boari flap, right psoas hitch;  Surgeon: [name], MD;  Location: [facility];  Service: Urology;;       Home Medications    Prior to Admission medications   Medication Sig Start Date End Date Taking? Authorizing Provider  aspirin EC 81 MG tablet Take 81 mg by mouth daily.   Yes [provider]  LORazepam (ATIVAN) 0.5 MG tablet Take 0.5-1 mg by mouth 2 (two) times daily as needed for anxiety.    Yes [provider]  metFORMIN (GLUCOPHAGE) 1000 MG tablet Take 1,000 mg by mouth 2 (two) times daily with a meal.   Yes [provider]  metoprolol tartrate (LOPRESSOR) 50 MG tablet Take 50 mg by mouth 2 (two) times daily.   Yes [provider]  Multiple Vitamin (MULTIVITAMIN) tablet Take 1 tablet by mouth daily.   Yes [provider]  olmesartan-hydrochlorothiazide (BENICAR HCT) 40-25 MG tablet Take  1 tablet by mouth daily.   Yes [provider]  polyethylene glycol (MIRALAX / GLYCOLAX) packet Take 17 g by mouth daily. Stop taking if having more than 2 soft/loose bowel movements daily. Patient taking differently: Take 17 g by mouth daily as needed for moderate constipation. Stop taking if having more than 2 soft/loose bowel movements daily. 08/08/16  Yes [name], MD  tamsulosin (FLOMAX) 0.4 MG CAPS capsule Take 1 capsule  (0.4 mg total) by mouth daily. 09/11/16  Yes [name], MD  acetaminophen (TYLENOL) 500 MG tablet Take 500 mg by mouth every 6 (six) hours as needed for mild pain or moderate pain.    [provider]  HYDROcodone-acetaminophen (NORCO) 5-325 MG tablet Take 1-2 tablets by mouth every 4 (four) hours as needed for severe pain. 10/01/16   Sherwood Gambler, MD  mirabegron ER (MYRBETRIQ) 50 MG TB24 tablet Take 1 tablet (50 mg total) by mouth daily. Patient not taking: Reported on 09/30/2016 09/11/16   [name], MD  promethazine (PHENERGAN) 25 MG tablet Take 25 mg by mouth every 8 (eight) hours as needed for nausea or vomiting.    [provider]  senna-docusate (SENOKOT-S) 8.6-50 MG tablet Take 1 tablet by mouth at bedtime. Patient not taking: Reported on 09/30/2016 08/08/16   [name], MD    Family History Family History  Problem Relation Age of Onset  . Lung cancer Mother        Deceased  . Throat cancer Brother   . Pancreatic cancer Father   . Heart disease Father        Deceased  . Lung cancer Maternal Uncle        nephew  . Diabetes Other     Social History Social History  Substance Use Topics  . Smoking status: Never Smoker  . Smokeless tobacco: Never Used  . Alcohol use Yes     Comment: Occasional     Allergies   Patient has no known allergies.   Review of Systems Review of Systems  Constitutional: Negative for fever.  Gastrointestinal: Positive for abdominal pain. Negative for nausea.  Genitourinary: Positive for flank pain and hematuria. Negative for dysuria.  All other systems reviewed and are negative.    Physical Exam Updated Vital Signs BP (!) 151/91   Pulse 81   Temp 98.5 F (36.9 C) (Oral)   Resp 18   SpO2 98%   Physical Exam  Constitutional: He is oriented to person, place, and time. He appears well-developed and well-nourished. He appears distressed (in pain).  HENT:  Head: Normocephalic and atraumatic.    Right Ear: External ear normal.  Left Ear: External ear normal.  Nose: Nose normal.  Eyes: Right eye exhibits no discharge. Left eye exhibits no discharge.  Neck: Neck supple.  Cardiovascular: Normal rate, regular rhythm and normal heart sounds.   Pulmonary/Chest: Effort normal and breath sounds normal.  Abdominal: Soft. He exhibits no distension. There is no tenderness.  Left cva tenderness Healed laparotomy scar  Musculoskeletal: He exhibits no edema.  Neurological: He is alert and oriented to person, place, and time.  Skin: Skin is warm and dry.  Nursing note and vitals reviewed.    ED Treatments / Results  Labs (all labs ordered are listed, but only abnormal results are displayed) Labs Reviewed  URINALYSIS, ROUTINE W REFLEX MICROSCOPIC - Abnormal; Notable for the following:       Result Value   APPearance HAZY (*)  Hgb urine dipstick LARGE (*)    Protein, ur 30 (*)    Leukocytes, UA LARGE (*)    All other components within normal limits  BASIC METABOLIC PANEL - Abnormal; Notable for the following:    Potassium 3.2 (*)    Glucose, Bld 163 (*)    BUN 23 (*)    Creatinine, Ser 1.91 (*)    Calcium 8.6 (*)    GFR calc non Af Amer 36 (*)    GFR calc Af Amer 42 (*)    All other components within normal limits  CBC WITH DIFFERENTIAL/PLATELET - Abnormal; Notable for the following:    WBC 10.9 (*)    RBC 3.68 (*)    Hemoglobin 10.2 (*)    HCT 29.6 (*)    Neutro Abs 7.9 (*)    Monocytes Absolute 1.1 (*)    All other components within normal limits  URINE CULTURE    EKG  EKG Interpretation None       Radiology Ct Renal Stone Study  Result Date: 09/30/2016 CLINICAL DATA:  Status post right ureteral reimplantation 07/31/2016 with boari flap and psoas hitch. Patient presents with acute left flank pain. EXAM: CT ABDOMEN AND PELVIS WITHOUT CONTRAST TECHNIQUE: Multidetector CT imaging of the abdomen and pelvis was performed following the standard protocol without IV  contrast. COMPARISON:  09/06/2016 unenhanced CT abdomen/pelvis. FINDINGS: Lower chest: No acute abnormality at the lung bases. Hepatobiliary: Diffuse hepatic steatosis. No definite liver surface irregularity. No discrete liver mass. Normal gallbladder with no radiopaque cholelithiasis. No biliary ductal dilatation. Pancreas: Heterogeneous fatty infiltration of the otherwise normal pancreas, with no pancreatic mass or duct dilation. Spleen: Normal size. No mass. Adrenals/Urinary Tract: Normal adrenals. Obstructing 2 mm distal left ureteral stone (located approximately 1.5 cm above the left ureterovesical junction) with mild left hydroureteronephrosis. No additional left renal or left ureteral stones. Interval removal of the right nephroureteral stent. Mild to moderate right hydroureteronephrosis to the level of the right ureterovesical re- implantation site, mildly worsened since 09/06/2016. Nonobstructing 5 mm lower right renal stone. No additional right renal stones. No right ureteral stones. Stable simple 4.6 cm lower right renal cyst. Stable simple 4.4 cm lower left renal cyst. Stable simple 1.2 cm posterior upper left renal cyst. No new contour deforming renal lesions. Postsurgical changes from right ureteral reimplantation with slightly decreased fat stranding surrounding the right bladder flap. Stable mild diffuse bladder wall thickening. Tiny 3 mm dependent bladder stone. Stomach/Bowel: Grossly normal stomach. Normal caliber small bowel with no small bowel wall thickening. Normal appendix. Normal large bowel with no diverticulosis, large bowel wall thickening or pericolonic fat stranding. Vascular/Lymphatic: Atherosclerotic nonaneurysmal abdominal aorta. No pathologically enlarged lymph nodes in the abdomen or pelvis. Reproductive: Stable mildly enlarged prostate. Other: Decreased focus of fat necrosis in the ventral right abdominal wall measuring 3.4 x 1.4 cm, previously 4.8 x 1.9 cm. No pneumoperitoneum. No  ascites. No focal fluid collection. Musculoskeletal: No aggressive appearing focal osseous lesions. Mild thoracolumbar spondylosis. IMPRESSION: 1. New obstructing 2 mm distal left ureteral stone with mild left hydroureteronephrosis. 2. Interval removal of right nephroureteral stent. Mild to moderate right hydroureteronephrosis is mildly worsened in the interval. Otherwise improving postsurgical fat stranding surrounding the right bladder flap. 3. Nonobstructing 5 mm lower right renal stone. Solitary layering 3 mm bladder stone. 4. Stable mild prostatomegaly. 5. Focus of fat necrosis in the ventral right abdominal wall is decreased in size . 6. Diffuse hepatic steatosis. 7.  Aortic Atherosclerosis (ICD10-I70.0). Electronically Signed  By: Ilona Sorrel M.D.   On: 09/30/2016 22:11    Procedures Procedures (including critical care time)  Medications Ordered in ED Medications  morphine 4 MG/ML injection 4 mg (4 mg Intravenous Given 09/30/16 2154)  sodium chloride 0.9 % bolus 1,000 mL (0 mLs Intravenous Stopped 09/30/16 2332)  ondansetron (ZOFRAN) injection 4 mg (4 mg Intravenous Given 09/30/16 2154)  morphine 4 MG/ML injection 4 mg (4 mg Intravenous Given 09/30/16 2327)  HYDROcodone-acetaminophen (NORCO/VICODIN) 5-325 MG per tablet 2 tablet (2 tablets Oral Given 10/01/16 0010)  sodium chloride 0.9 % bolus 1,000 mL (1,000 mLs Intravenous New Bag/Given 10/01/16 0010)     Initial Impression / Assessment and Plan / ED Course  I have reviewed the triage vital signs and the nursing notes.  Pertinent labs & imaging results that were available during my care of the patient were reviewed by me and considered in my medical decision making (see chart for details).     Patient's workup shows a distal left ureteral stone of 2 mm. His pain is getting better with IV morphine. His creatinine on discharge with last hospitalization was around 1.4. Today it is 1.9. I discussed his CT findings with urology on call, Dr.  Pilar Jarvis. He reviewed the patient's chart. Given his pain is improving, he feels it is stable for him to go home despite the increased creatinine and the essentially bilateral hydronephrosis. However his right Hydro is not obstructive necessarily and thus he should be okay. He is not vomiting. His pain is better. He is comfortable going home and will be given a strainer. He is R [place] on [employer]. I will hold off on NSAIDs given the worsening renal function. However he is still urinating plenty in the ER. Will discharge home with strict return precautions, follow up with his urologist, Dr. Louis Meckel this week. No UTI symptoms, urine will be sent for culture.  Final Clinical Impressions(s) / ED Diagnoses   Final diagnoses:  Left ureteral stone    New Prescriptions New Prescriptions   HYDROCODONE-ACETAMINOPHEN (NORCO) 5-325 MG TABLET    Take 1-2 tablets by mouth every 4 (four) hours as needed for severe pain.     Sherwood Gambler, MD 10/01/16 [phone]

## 2016-10-01 NOTE — Patient Outreach (Signed)
[facility]) Care Management  10/01/2016  [name] Nov 21, 1955 [id number]   CSW noted that patient presented to the emergency department at [facility] on Monday, July 16th with complaints of severe right Flank Pain.  Patient was found to have a left urethral stone, was given intravenous pain medications and has since been discharged home, with strict follow-up orders.  CSW will make contact with patient within the next 48 hours to assess and assist with possible discharge planning needs and services. Nat Christen, BSW, MSW, LCSW  Licensed Education officer, environmental Health System  Mailing [place] [address] [place], [place] [address] Physical Address-[address], [place], [place] [address] Toll Free Main # [phone] Fax # [phone] Cell # [phone]  Office # [phone] [email or web address]

## 2016-10-02 ENCOUNTER — Other Ambulatory Visit: Payer: Self-pay | Admitting: *Deleted

## 2016-10-02 DIAGNOSIS — S82201D Unspecified fracture of shaft of right tibia, subsequent encounter for closed fracture with routine healing: Secondary | ICD-10-CM | POA: Diagnosis not present

## 2016-10-02 DIAGNOSIS — F339 Major depressive disorder, recurrent, unspecified: Secondary | ICD-10-CM | POA: Diagnosis not present

## 2016-10-02 DIAGNOSIS — E785 Hyperlipidemia, unspecified: Secondary | ICD-10-CM | POA: Diagnosis not present

## 2016-10-02 DIAGNOSIS — E119 Type 2 diabetes mellitus without complications: Secondary | ICD-10-CM | POA: Diagnosis not present

## 2016-10-02 DIAGNOSIS — D509 Iron deficiency anemia, unspecified: Secondary | ICD-10-CM | POA: Diagnosis not present

## 2016-10-02 DIAGNOSIS — I10 Essential (primary) hypertension: Secondary | ICD-10-CM | POA: Diagnosis not present

## 2016-10-02 LAB — URINE CULTURE

## 2016-10-02 NOTE — Patient Outreach (Signed)
[facility]) Care Management  10/02/2016  RANDIE TALLARICO 27-Jun-1955 [id number]   CSW made an attempt to try and contact patient today to follow-up regarding social work services and resources; however, patient was unavailable at the time of CSW's call.  A HIPAA compliant message was left for patient on voicemail.  CSW is currently awaiting a return call.  CSW will make a second outreach attempt within the next week, if CSW does not receive a return call from patient in the meantime. Nat Christen, BSW, MSW, LCSW  Licensed Education officer, environmental Health System  Mailing [place] [address] [place], [place] [address] Physical Address-[address], [place], [place] [address] Toll Free Main # [phone] Fax # [phone] Cell # [phone]  Office # [phone] [email or web address]

## 2016-10-03 ENCOUNTER — Ambulatory Visit: Payer: Self-pay | Admitting: *Deleted

## 2016-10-03 DIAGNOSIS — S82201D Unspecified fracture of shaft of right tibia, subsequent encounter for closed fracture with routine healing: Secondary | ICD-10-CM | POA: Diagnosis not present

## 2016-10-03 DIAGNOSIS — D509 Iron deficiency anemia, unspecified: Secondary | ICD-10-CM | POA: Diagnosis not present

## 2016-10-03 DIAGNOSIS — F339 Major depressive disorder, recurrent, unspecified: Secondary | ICD-10-CM | POA: Diagnosis not present

## 2016-10-03 DIAGNOSIS — E785 Hyperlipidemia, unspecified: Secondary | ICD-10-CM | POA: Diagnosis not present

## 2016-10-03 DIAGNOSIS — E119 Type 2 diabetes mellitus without complications: Secondary | ICD-10-CM | POA: Diagnosis not present

## 2016-10-03 DIAGNOSIS — I10 Essential (primary) hypertension: Secondary | ICD-10-CM | POA: Diagnosis not present

## 2016-10-08 ENCOUNTER — Encounter: Payer: Self-pay | Admitting: *Deleted

## 2016-10-08 ENCOUNTER — Other Ambulatory Visit: Payer: Self-pay | Admitting: *Deleted

## 2016-10-08 DIAGNOSIS — F339 Major depressive disorder, recurrent, unspecified: Secondary | ICD-10-CM | POA: Diagnosis not present

## 2016-10-08 DIAGNOSIS — I10 Essential (primary) hypertension: Secondary | ICD-10-CM | POA: Diagnosis not present

## 2016-10-08 DIAGNOSIS — S82201D Unspecified fracture of shaft of right tibia, subsequent encounter for closed fracture with routine healing: Secondary | ICD-10-CM | POA: Diagnosis not present

## 2016-10-08 DIAGNOSIS — D509 Iron deficiency anemia, unspecified: Secondary | ICD-10-CM | POA: Diagnosis not present

## 2016-10-08 DIAGNOSIS — E119 Type 2 diabetes mellitus without complications: Secondary | ICD-10-CM | POA: Diagnosis not present

## 2016-10-08 DIAGNOSIS — E785 Hyperlipidemia, unspecified: Secondary | ICD-10-CM | POA: Diagnosis not present

## 2016-10-08 NOTE — Patient Outreach (Signed)
[facility]) Care Management  10/08/2016  [name] October 24, 1955 [id number]   CSW was able to make contact with patient today to follow-up regarding social work services and resources.  Patient was able to satisfy two HIPAA compliant identifiers, which included name an date of birth.  Patient reports that he is getting along well at home, stating "I ain't perfect, but I can sure try". Patient is receiving home health physical therapy, occupational therapy, nursing and an aide and all durable medical equipment has been ordered and already delivered to patient's home.  Patient reports being able to afford his prescription medications, perform activities of daily living independently and that he is taking his medications exactly as prescribed.  Patient denied wanting to complete Advanced Directives (Living Will and [place] documents) and reports having transportation to and from all physician appointments.  Patient denies having any additional social work needs at present. CSW will perform a case closure on patient, as all goals of treatment have been met from social work standpoint and no additional social work needs have been identified at this time.  CSW will fax an update to patient's Primary Care Physician, Dr. [name] to ensure that they are aware of CSW's involvement with patient's plan of care.  CSW will submit a case closure request to Verlon Setting, Care Management Assistant with [facility], in the form of an [employer].   Nat Christen, BSW, MSW, LCSW  Licensed Education officer, environmental Health System  Mailing [place] [address], [place], [place] [address] Physical Address[address], [place], [place] [address] Toll Free Main # [phone] Fax # [phone] Cell # [phone]  Office # [phone] [email or web address]

## 2016-10-08 NOTE — Patient Outreach (Signed)
[facility]) Care Management  10/08/2016  [name] 1956/02/28 [id number]   CSW made a second attempt to try and contact patient today to perform phone assessment, as well as assess and assist with social needs and services, without success.  A HIPAA compliant message was left for patient on voicemail.  CSW is currently awaiting a return call. CSW will make a third and final outreach attempt within the next week, if CSW does not receive a return call from patient in the meantime. Nat Christen, BSW, MSW, LCSW  Licensed Education officer, environmental Health System  Mailing [place] [address] [place], [place] [address] Physical Address[address], [place],  [address] Toll Free Main # [phone] Fax # [phone] Cell # [phone]  Office # [phone] [email or web address]

## 2016-10-09 ENCOUNTER — Ambulatory Visit: Payer: Self-pay | Admitting: *Deleted

## 2016-10-09 DIAGNOSIS — F339 Major depressive disorder, recurrent, unspecified: Secondary | ICD-10-CM | POA: Diagnosis not present

## 2016-10-09 DIAGNOSIS — S82201D Unspecified fracture of shaft of right tibia, subsequent encounter for closed fracture with routine healing: Secondary | ICD-10-CM | POA: Diagnosis not present

## 2016-10-09 DIAGNOSIS — D509 Iron deficiency anemia, unspecified: Secondary | ICD-10-CM | POA: Diagnosis not present

## 2016-10-09 DIAGNOSIS — I10 Essential (primary) hypertension: Secondary | ICD-10-CM | POA: Diagnosis not present

## 2016-10-09 DIAGNOSIS — E119 Type 2 diabetes mellitus without complications: Secondary | ICD-10-CM | POA: Diagnosis not present

## 2016-10-09 DIAGNOSIS — E785 Hyperlipidemia, unspecified: Secondary | ICD-10-CM | POA: Diagnosis not present

## 2016-10-11 DIAGNOSIS — F339 Major depressive disorder, recurrent, unspecified: Secondary | ICD-10-CM | POA: Diagnosis not present

## 2016-10-11 DIAGNOSIS — I10 Essential (primary) hypertension: Secondary | ICD-10-CM | POA: Diagnosis not present

## 2016-10-11 DIAGNOSIS — E119 Type 2 diabetes mellitus without complications: Secondary | ICD-10-CM | POA: Diagnosis not present

## 2016-10-11 DIAGNOSIS — S82201D Unspecified fracture of shaft of right tibia, subsequent encounter for closed fracture with routine healing: Secondary | ICD-10-CM | POA: Diagnosis not present

## 2016-10-11 DIAGNOSIS — D509 Iron deficiency anemia, unspecified: Secondary | ICD-10-CM | POA: Diagnosis not present

## 2016-10-11 DIAGNOSIS — E785 Hyperlipidemia, unspecified: Secondary | ICD-10-CM | POA: Diagnosis not present

## 2016-10-15 ENCOUNTER — Ambulatory Visit: Payer: Self-pay | Admitting: *Deleted

## 2016-10-15 DIAGNOSIS — S82201D Unspecified fracture of shaft of right tibia, subsequent encounter for closed fracture with routine healing: Secondary | ICD-10-CM | POA: Diagnosis not present

## 2016-10-15 DIAGNOSIS — I10 Essential (primary) hypertension: Secondary | ICD-10-CM | POA: Diagnosis not present

## 2016-10-15 DIAGNOSIS — E119 Type 2 diabetes mellitus without complications: Secondary | ICD-10-CM | POA: Diagnosis not present

## 2016-10-15 DIAGNOSIS — D509 Iron deficiency anemia, unspecified: Secondary | ICD-10-CM | POA: Diagnosis not present

## 2016-10-15 DIAGNOSIS — F339 Major depressive disorder, recurrent, unspecified: Secondary | ICD-10-CM | POA: Diagnosis not present

## 2016-10-15 DIAGNOSIS — E785 Hyperlipidemia, unspecified: Secondary | ICD-10-CM | POA: Diagnosis not present

## 2016-10-16 ENCOUNTER — Ambulatory Visit (INDEPENDENT_AMBULATORY_CARE_PROVIDER_SITE_OTHER): Payer: Medicare Other | Admitting: Orthopedic Surgery

## 2016-10-17 DIAGNOSIS — E785 Hyperlipidemia, unspecified: Secondary | ICD-10-CM | POA: Diagnosis not present

## 2016-10-17 DIAGNOSIS — D509 Iron deficiency anemia, unspecified: Secondary | ICD-10-CM | POA: Diagnosis not present

## 2016-10-17 DIAGNOSIS — F339 Major depressive disorder, recurrent, unspecified: Secondary | ICD-10-CM | POA: Diagnosis not present

## 2016-10-17 DIAGNOSIS — S82201D Unspecified fracture of shaft of right tibia, subsequent encounter for closed fracture with routine healing: Secondary | ICD-10-CM | POA: Diagnosis not present

## 2016-10-17 DIAGNOSIS — I10 Essential (primary) hypertension: Secondary | ICD-10-CM | POA: Diagnosis not present

## 2016-10-17 DIAGNOSIS — E119 Type 2 diabetes mellitus without complications: Secondary | ICD-10-CM | POA: Diagnosis not present

## 2016-10-18 DIAGNOSIS — D509 Iron deficiency anemia, unspecified: Secondary | ICD-10-CM | POA: Diagnosis not present

## 2016-10-18 DIAGNOSIS — F339 Major depressive disorder, recurrent, unspecified: Secondary | ICD-10-CM | POA: Diagnosis not present

## 2016-10-18 DIAGNOSIS — E119 Type 2 diabetes mellitus without complications: Secondary | ICD-10-CM | POA: Diagnosis not present

## 2016-10-18 DIAGNOSIS — E785 Hyperlipidemia, unspecified: Secondary | ICD-10-CM | POA: Diagnosis not present

## 2016-10-18 DIAGNOSIS — S82201D Unspecified fracture of shaft of right tibia, subsequent encounter for closed fracture with routine healing: Secondary | ICD-10-CM | POA: Diagnosis not present

## 2016-10-18 DIAGNOSIS — I10 Essential (primary) hypertension: Secondary | ICD-10-CM | POA: Diagnosis not present

## 2016-10-21 ENCOUNTER — Ambulatory Visit (INDEPENDENT_AMBULATORY_CARE_PROVIDER_SITE_OTHER): Payer: Medicare Other | Admitting: Orthopedic Surgery

## 2016-10-23 ENCOUNTER — Ambulatory Visit (INDEPENDENT_AMBULATORY_CARE_PROVIDER_SITE_OTHER): Payer: Medicare Other

## 2016-10-23 ENCOUNTER — Encounter (INDEPENDENT_AMBULATORY_CARE_PROVIDER_SITE_OTHER): Payer: Self-pay | Admitting: Orthopedic Surgery

## 2016-10-23 ENCOUNTER — Ambulatory Visit (INDEPENDENT_AMBULATORY_CARE_PROVIDER_SITE_OTHER): Payer: Medicare Other | Admitting: Orthopedic Surgery

## 2016-10-23 VITALS — Ht 66.0 in | Wt 179.0 lb

## 2016-10-23 DIAGNOSIS — S82244S Nondisplaced spiral fracture of shaft of right tibia, sequela: Secondary | ICD-10-CM

## 2016-10-23 DIAGNOSIS — S82201D Unspecified fracture of shaft of right tibia, subsequent encounter for closed fracture with routine healing: Secondary | ICD-10-CM | POA: Diagnosis not present

## 2016-10-23 DIAGNOSIS — F339 Major depressive disorder, recurrent, unspecified: Secondary | ICD-10-CM | POA: Diagnosis not present

## 2016-10-23 DIAGNOSIS — D509 Iron deficiency anemia, unspecified: Secondary | ICD-10-CM | POA: Diagnosis not present

## 2016-10-23 DIAGNOSIS — E785 Hyperlipidemia, unspecified: Secondary | ICD-10-CM | POA: Diagnosis not present

## 2016-10-23 DIAGNOSIS — E119 Type 2 diabetes mellitus without complications: Secondary | ICD-10-CM | POA: Diagnosis not present

## 2016-10-23 DIAGNOSIS — I10 Essential (primary) hypertension: Secondary | ICD-10-CM | POA: Diagnosis not present

## 2016-10-23 MED ORDER — HYDROCODONE-ACETAMINOPHEN 5-325 MG PO TABS: 1.0000 | ORAL_TABLET | Freq: Four times a day (QID) | ORAL | 0 refills | Status: DC | PRN

## 2016-10-23 NOTE — Progress Notes (Signed)
Office Visit Note   Patient: [name]           Date of Birth: [birth year]           MRN: [id number] Visit Date: 10/23/2016              Requested by: Jonathon Jordan, MD [place], [place] [address] PCP: Jonathon Jordan, MD  Chief Complaint  Patient presents with  . Right Leg - Fracture    Right tibia spiral fracture      HPI: Patient is a 61 year old gentleman who is about 7 months status post a closed nondisplaced tib-fib fracture on the right. Patient states he's been hospitalized 3 times in the past 6 months he most recently has been hospitalized for a kidney stone which patient states he had to undergo emergent surgery with an extensive open procedure. The patient states he has not been using his walker. He states he does have pain with walking with the fracture boot he states he does have physical therapy coming to his house.  Assessment & Plan: Visit Diagnoses:  1. Closed nondisplaced spiral fracture of shaft of right tibia, sequela     Plan: We will have him continue with the fracture boot until he is asymptomatic. Discussed if there is a nonunion we would need to consider intramedullary nail fixation. He may advance to a hiking boot once he is asymptomatic in the fracture boot. We will provide a prescription for Vicodin for breakthrough pain patient states he has ulcerative disease and has been having increasing pain with using nonsteroidal anti-inflammatories.  Two-view radiographs of the right tibia and fibula at follow-up.  Follow-Up Instructions: Return in about 4 weeks (around 11/20/2016).   Ortho Exam  Patient is alert, oriented, no adenopathy, well-dressed, normal affect, normal respiratory effort. Examination patient has an antalgic gait he has tenderness to palpation over the tibia. There is no skin breakdown no signs of infection he has good circulation with no ischemic changes.  Imaging: Xr Tibia/fibula Right  Result Date:  10/23/2016 2 view radiographs of the right tibia and fibula show some persistent lucency with a delayed union of the nondisplaced tibia and fibular fracture.   Labs: Lab Results  Component Value Date   HGBA1C 7.6 06/26/2016   HGBA1C 7.6 06/26/2016   HGBA1C 8.2 (H) 03/23/2016   LABURIC 8.0 (H) 03/25/2016   REPTSTATUS 10/02/2016 FINAL 09/30/2016   CULT MULTIPLE SPECIES PRESENT, SUGGEST RECOLLECTION (A) 09/30/2016   LABORGA ENTEROCOCCUS FAECALIS (A) 09/06/2016    Orders:  Orders Placed This Encounter  Procedures  . XR Tibia/Fibula Right   No orders of the defined types were placed in this encounter.    Procedures: No procedures performed  Clinical Data: No additional findings.  ROS:  All other systems negative, except as noted in the HPI. Review of Systems  Objective: Vital Signs: Ht 5\' 6"  (1.676 m)   Wt 179 lb (81.2 kg)   BMI 28.89 kg/m   Specialty Comments:  No specialty comments available.  PMFS History: Patient Active Problem List   Diagnosis Date Noted  . Complicated urinary tract infection 09/06/2016  . Nephrolithiasis 08/01/2016  . Ureteral trauma, subsequent encounter 08/01/2016  . Rectal bleeding 07/31/2016  . Uncontrolled type 2 diabetes mellitus with hyperglycemia ([place]) 07/31/2016  . Urolithiasis 07/31/2016  . Right ureteral stone   . Type 2 diabetes mellitus without complication, without long-term current use of insulin ([place]) 05/29/2016  . Dyslipidemia associated with type 2  diabetes mellitus ([facility]) 05/29/2016  . Closed nondisplaced spiral fracture of shaft of right tibia   . Right tibial fracture 03/22/2016  . Microcytic anemia 02/17/2012  . Prostate cancer ([place] [facility]) 02/17/2012  . 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 Benign hypertension 02/17/2012  . Depressive disorder 02/17/2012   Past Medical History:  Diagnosis Date  . Depression   . GERD (gastroesophageal reflux disease)   . Hemorrhoids   . Hypertension   . Macular degeneration   .  Microcytic anemia   . Prostate cancer ([place]) 08/2009  . Tubular adenoma     Family History  Problem Relation Age of Onset  . Lung cancer Mother        Deceased  . Throat cancer Brother   . Pancreatic cancer Father   . Heart disease Father        Deceased  . Lung cancer Maternal Uncle        nephew  . Diabetes Other     Past Surgical History:  Procedure Laterality Date  . CYSTOSCOPY WITH RETROGRADE PYELOGRAM, URETEROSCOPY AND STENT PLACEMENT Right 07/31/2016   Procedure: CYSTOSCOPY WITH RIGHT  RETROGRADE PYELOGRAM, URETEROSCOPY;  Surgeon: [name], MD;  Location: [facility];  Service: Urology;  Laterality: Right;  . INGUINAL HERNIA REPAIR    . Left ankle joint fusion  1981  . PROSTATE BIOPSY     x 2  . URETERAL REIMPLANTION  07/31/2016   Procedure: URETERAL REIMPLANT, right boari flap, right psoas hitch;  Surgeon: [name], MD;  Location: [facility];  Service: Urology;;   Social History   Occupational History  . Not on file.   Social History Main Topics  . Smoking status: Never Smoker  . Smokeless tobacco: Never Used  . Alcohol use Yes     Comment: Occasional  . Drug use: No  . Sexual activity: Not Currently

## 2016-10-29 DIAGNOSIS — I10 Essential (primary) hypertension: Secondary | ICD-10-CM | POA: Diagnosis not present

## 2016-10-29 DIAGNOSIS — S82201D Unspecified fracture of shaft of right tibia, subsequent encounter for closed fracture with routine healing: Secondary | ICD-10-CM | POA: Diagnosis not present

## 2016-10-29 DIAGNOSIS — E785 Hyperlipidemia, unspecified: Secondary | ICD-10-CM | POA: Diagnosis not present

## 2016-10-29 DIAGNOSIS — D509 Iron deficiency anemia, unspecified: Secondary | ICD-10-CM | POA: Diagnosis not present

## 2016-10-29 DIAGNOSIS — E119 Type 2 diabetes mellitus without complications: Secondary | ICD-10-CM | POA: Diagnosis not present

## 2016-10-29 DIAGNOSIS — F339 Major depressive disorder, recurrent, unspecified: Secondary | ICD-10-CM | POA: Diagnosis not present

## 2016-10-30 DIAGNOSIS — S82201D Unspecified fracture of shaft of right tibia, subsequent encounter for closed fracture with routine healing: Secondary | ICD-10-CM | POA: Diagnosis not present

## 2016-10-30 DIAGNOSIS — F339 Major depressive disorder, recurrent, unspecified: Secondary | ICD-10-CM | POA: Diagnosis not present

## 2016-10-30 DIAGNOSIS — D509 Iron deficiency anemia, unspecified: Secondary | ICD-10-CM | POA: Diagnosis not present

## 2016-10-30 DIAGNOSIS — I10 Essential (primary) hypertension: Secondary | ICD-10-CM | POA: Diagnosis not present

## 2016-10-30 DIAGNOSIS — E119 Type 2 diabetes mellitus without complications: Secondary | ICD-10-CM | POA: Diagnosis not present

## 2016-10-30 DIAGNOSIS — E785 Hyperlipidemia, unspecified: Secondary | ICD-10-CM | POA: Diagnosis not present

## 2016-11-01 DIAGNOSIS — E119 Type 2 diabetes mellitus without complications: Secondary | ICD-10-CM | POA: Diagnosis not present

## 2016-11-01 DIAGNOSIS — I10 Essential (primary) hypertension: Secondary | ICD-10-CM | POA: Diagnosis not present

## 2016-11-01 DIAGNOSIS — F339 Major depressive disorder, recurrent, unspecified: Secondary | ICD-10-CM | POA: Diagnosis not present

## 2016-11-01 DIAGNOSIS — S82201D Unspecified fracture of shaft of right tibia, subsequent encounter for closed fracture with routine healing: Secondary | ICD-10-CM | POA: Diagnosis not present

## 2016-11-01 DIAGNOSIS — E785 Hyperlipidemia, unspecified: Secondary | ICD-10-CM | POA: Diagnosis not present

## 2016-11-01 DIAGNOSIS — D509 Iron deficiency anemia, unspecified: Secondary | ICD-10-CM | POA: Diagnosis not present

## 2016-11-06 DIAGNOSIS — D509 Iron deficiency anemia, unspecified: Secondary | ICD-10-CM | POA: Diagnosis not present

## 2016-11-06 DIAGNOSIS — F339 Major depressive disorder, recurrent, unspecified: Secondary | ICD-10-CM | POA: Diagnosis not present

## 2016-11-06 DIAGNOSIS — S82201D Unspecified fracture of shaft of right tibia, subsequent encounter for closed fracture with routine healing: Secondary | ICD-10-CM | POA: Diagnosis not present

## 2016-11-06 DIAGNOSIS — E785 Hyperlipidemia, unspecified: Secondary | ICD-10-CM | POA: Diagnosis not present

## 2016-11-06 DIAGNOSIS — I10 Essential (primary) hypertension: Secondary | ICD-10-CM | POA: Diagnosis not present

## 2016-11-06 DIAGNOSIS — E119 Type 2 diabetes mellitus without complications: Secondary | ICD-10-CM | POA: Diagnosis not present

## 2016-11-08 DIAGNOSIS — E119 Type 2 diabetes mellitus without complications: Secondary | ICD-10-CM | POA: Diagnosis not present

## 2016-11-08 DIAGNOSIS — D509 Iron deficiency anemia, unspecified: Secondary | ICD-10-CM | POA: Diagnosis not present

## 2016-11-08 DIAGNOSIS — I10 Essential (primary) hypertension: Secondary | ICD-10-CM | POA: Diagnosis not present

## 2016-11-08 DIAGNOSIS — E785 Hyperlipidemia, unspecified: Secondary | ICD-10-CM | POA: Diagnosis not present

## 2016-11-08 DIAGNOSIS — F339 Major depressive disorder, recurrent, unspecified: Secondary | ICD-10-CM | POA: Diagnosis not present

## 2016-11-08 DIAGNOSIS — S82201D Unspecified fracture of shaft of right tibia, subsequent encounter for closed fracture with routine healing: Secondary | ICD-10-CM | POA: Diagnosis not present

## 2016-11-12 DIAGNOSIS — S82201D Unspecified fracture of shaft of right tibia, subsequent encounter for closed fracture with routine healing: Secondary | ICD-10-CM | POA: Diagnosis not present

## 2016-11-12 DIAGNOSIS — E785 Hyperlipidemia, unspecified: Secondary | ICD-10-CM | POA: Diagnosis not present

## 2016-11-12 DIAGNOSIS — I10 Essential (primary) hypertension: Secondary | ICD-10-CM | POA: Diagnosis not present

## 2016-11-12 DIAGNOSIS — D509 Iron deficiency anemia, unspecified: Secondary | ICD-10-CM | POA: Diagnosis not present

## 2016-11-12 DIAGNOSIS — F339 Major depressive disorder, recurrent, unspecified: Secondary | ICD-10-CM | POA: Diagnosis not present

## 2016-11-12 DIAGNOSIS — E119 Type 2 diabetes mellitus without complications: Secondary | ICD-10-CM | POA: Diagnosis not present

## 2016-11-20 ENCOUNTER — Ambulatory Visit (INDEPENDENT_AMBULATORY_CARE_PROVIDER_SITE_OTHER): Payer: Medicare Other | Admitting: Orthopedic Surgery

## 2016-11-21 DIAGNOSIS — I10 Essential (primary) hypertension: Secondary | ICD-10-CM | POA: Diagnosis not present

## 2016-11-21 DIAGNOSIS — D509 Iron deficiency anemia, unspecified: Secondary | ICD-10-CM | POA: Diagnosis not present

## 2016-11-21 DIAGNOSIS — E119 Type 2 diabetes mellitus without complications: Secondary | ICD-10-CM | POA: Diagnosis not present

## 2016-11-21 DIAGNOSIS — S82201D Unspecified fracture of shaft of right tibia, subsequent encounter for closed fracture with routine healing: Secondary | ICD-10-CM | POA: Diagnosis not present

## 2016-11-21 DIAGNOSIS — F339 Major depressive disorder, recurrent, unspecified: Secondary | ICD-10-CM | POA: Diagnosis not present

## 2016-11-21 DIAGNOSIS — E785 Hyperlipidemia, unspecified: Secondary | ICD-10-CM | POA: Diagnosis not present

## 2016-11-22 DIAGNOSIS — E785 Hyperlipidemia, unspecified: Secondary | ICD-10-CM | POA: Diagnosis not present

## 2016-11-22 DIAGNOSIS — F339 Major depressive disorder, recurrent, unspecified: Secondary | ICD-10-CM | POA: Diagnosis not present

## 2016-11-22 DIAGNOSIS — I10 Essential (primary) hypertension: Secondary | ICD-10-CM | POA: Diagnosis not present

## 2016-11-22 DIAGNOSIS — E119 Type 2 diabetes mellitus without complications: Secondary | ICD-10-CM | POA: Diagnosis not present

## 2016-11-22 DIAGNOSIS — D509 Iron deficiency anemia, unspecified: Secondary | ICD-10-CM | POA: Diagnosis not present

## 2016-11-22 DIAGNOSIS — S82201D Unspecified fracture of shaft of right tibia, subsequent encounter for closed fracture with routine healing: Secondary | ICD-10-CM | POA: Diagnosis not present

## 2016-12-02 DIAGNOSIS — N200 Calculus of kidney: Secondary | ICD-10-CM | POA: Diagnosis not present

## 2016-12-02 DIAGNOSIS — I10 Essential (primary) hypertension: Secondary | ICD-10-CM | POA: Diagnosis not present

## 2016-12-02 DIAGNOSIS — M79604 Pain in right leg: Secondary | ICD-10-CM | POA: Diagnosis not present

## 2016-12-02 DIAGNOSIS — E119 Type 2 diabetes mellitus without complications: Secondary | ICD-10-CM | POA: Diagnosis not present

## 2016-12-04 DIAGNOSIS — B351 Tinea unguium: Secondary | ICD-10-CM | POA: Diagnosis not present

## 2016-12-04 DIAGNOSIS — L03032 Cellulitis of left toe: Secondary | ICD-10-CM | POA: Diagnosis not present

## 2016-12-04 DIAGNOSIS — L03031 Cellulitis of right toe: Secondary | ICD-10-CM | POA: Diagnosis not present

## 2016-12-05 ENCOUNTER — Ambulatory Visit (INDEPENDENT_AMBULATORY_CARE_PROVIDER_SITE_OTHER): Payer: Medicare Other

## 2016-12-05 ENCOUNTER — Encounter (INDEPENDENT_AMBULATORY_CARE_PROVIDER_SITE_OTHER): Payer: Self-pay | Admitting: Orthopedic Surgery

## 2016-12-05 ENCOUNTER — Ambulatory Visit (INDEPENDENT_AMBULATORY_CARE_PROVIDER_SITE_OTHER): Payer: Medicare Other | Admitting: Orthopedic Surgery

## 2016-12-05 DIAGNOSIS — M79604 Pain in right leg: Secondary | ICD-10-CM

## 2016-12-05 DIAGNOSIS — S82244S Nondisplaced spiral fracture of shaft of right tibia, sequela: Secondary | ICD-10-CM | POA: Diagnosis not present

## 2016-12-05 MED ORDER — HYDROCODONE-ACETAMINOPHEN 5-325 MG PO TABS: 1.0000 | ORAL_TABLET | Freq: Two times a day (BID) | ORAL | 0 refills | Status: DC | PRN

## 2016-12-05 NOTE — Progress Notes (Signed)
Office Visit Note   Patient: [name]           Date of Birth: 05-Sep-1955           MRN: [id number] Visit Date: 12/05/2016              Requested by: Jonathon Jordan, MD [place], [place] [address] PCP: Jonathon Jordan, MD  Chief Complaint  Patient presents with  . Right Leg - Follow-up      HPI: Patient presents for follow-up status post closed nondisplaced spiral fracture of the right tibia and fibula. Patient has ambulated with regular shoewear around the house without any significant symptoms he states most of time he uses the fracture boot.  Assessment & Plan: Visit Diagnoses:  1. Pain in right leg   2. Closed nondisplaced spiral fracture of shaft of right tibia, sequela     Plan: Patient was given a note that he can use ground-level housing instead of walking up a flight of stairs he is given a prescription for Vicodin for pain he will continue to increase his activities as tolerated.  Follow-Up Instructions: Return if symptoms worsen or fail to improve.   Ortho Exam  Patient is alert, oriented, no adenopathy, well-dressed, normal affect, normal respiratory effort. Examination patient's fracture site is nontender to palpation and is able to weight-bear with an antalgic gait does limp but does not have pain with walking.  Imaging: Xr Tibia/fibula Right  Result Date: 12/05/2016 Two-view radiographs of the right tibia shows good callus formation with good interval healing no complicating features.  No images are attached to the encounter.  Labs: Lab Results  Component Value Date   HGBA1C 7.6 06/26/2016   HGBA1C 7.6 06/26/2016   HGBA1C 8.2 (H) 03/23/2016   LABURIC 8.0 (H) 03/25/2016   REPTSTATUS 10/02/2016 FINAL 09/30/2016   CULT MULTIPLE SPECIES PRESENT, SUGGEST RECOLLECTION (A) 09/30/2016   LABORGA ENTEROCOCCUS FAECALIS (A) 09/06/2016    Orders:  Orders Placed This Encounter  Procedures  . XR Tibia/Fibula Right   Meds  ordered this encounter  Medications  . HYDROcodone-acetaminophen (NORCO/VICODIN) 5-325 MG tablet    Sig: Take 1 tablet by mouth 2 (two) times daily as needed for moderate pain.    Dispense:  20 tablet    Refill:  0     Procedures: No procedures performed  Clinical Data: No additional findings.  ROS:  All other systems negative, except as noted in the HPI. Review of Systems  Objective: Vital Signs: There were no vitals taken for this visit.  Specialty Comments:  No specialty comments available.  PMFS History: Patient Active Problem List   Diagnosis Date Noted  . Complicated urinary tract infection 09/06/2016  . Nephrolithiasis 08/01/2016  . Ureteral trauma, subsequent encounter 08/01/2016  . Rectal bleeding 07/31/2016  . Uncontrolled type 2 diabetes mellitus with hyperglycemia ([place]) 07/31/2016  . Urolithiasis 07/31/2016  . Right ureteral stone   . Type 2 diabetes mellitus without complication, without long-term current use of insulin ([place]) 05/29/2016  . Dyslipidemia associated with type 2 diabetes mellitus ([place]) 05/29/2016  . Closed nondisplaced spiral fracture of shaft of right tibia   . Right tibial fracture 03/22/2016  . Microcytic anemia 02/17/2012  . Prostate cancer ([place]) 02/17/2012  . 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 Benign hypertension 02/17/2012  . Depressive disorder 02/17/2012   Past Medical History:  Diagnosis Date  . Depression   . GERD (gastroesophageal reflux disease)   . Hemorrhoids   .  Hypertension   . Macular degeneration   . Microcytic anemia   . Prostate cancer ([place]) 08/2009  . Tubular adenoma     Family History  Problem Relation Age of Onset  . Lung cancer Mother        Deceased  . Throat cancer Brother   . Pancreatic cancer Father   . Heart disease Father        Deceased  . Lung cancer Maternal Uncle        nephew  . Diabetes Other     Past Surgical History:  Procedure Laterality Date  . CYSTOSCOPY WITH RETROGRADE  PYELOGRAM, URETEROSCOPY AND STENT PLACEMENT Right 07/31/2016   Procedure: CYSTOSCOPY WITH RIGHT  RETROGRADE PYELOGRAM, URETEROSCOPY;  Surgeon: [name], MD;  Location: [facility];  Service: Urology;  Laterality: Right;  . INGUINAL HERNIA REPAIR    . Left ankle joint fusion  1981  . PROSTATE BIOPSY     x 2  . URETERAL REIMPLANTION  07/31/2016   Procedure: URETERAL REIMPLANT, right boari flap, right psoas hitch;  Surgeon: [name], MD;  Location: [facility];  Service: Urology;;   Social History   Occupational History  . Not on file.   Social History Main Topics  . Smoking status: Never Smoker  . Smokeless tobacco: Never Used  . Alcohol use Yes     Comment: Occasional  . Drug use: No  . Sexual activity: Not Currently

## 2016-12-18 ENCOUNTER — Ambulatory Visit (INDEPENDENT_AMBULATORY_CARE_PROVIDER_SITE_OTHER): Payer: Medicare Other | Admitting: Orthopedic Surgery

## 2016-12-30 DIAGNOSIS — R8290 Unspecified abnormal findings in urine: Secondary | ICD-10-CM | POA: Diagnosis not present

## 2016-12-30 DIAGNOSIS — N138 Other obstructive and reflux uropathy: Secondary | ICD-10-CM | POA: Diagnosis not present

## 2016-12-30 DIAGNOSIS — C61 Malignant neoplasm of prostate: Secondary | ICD-10-CM | POA: Diagnosis not present

## 2016-12-30 DIAGNOSIS — N529 Male erectile dysfunction, unspecified: Secondary | ICD-10-CM | POA: Diagnosis not present

## 2016-12-30 DIAGNOSIS — N401 Enlarged prostate with lower urinary tract symptoms: Secondary | ICD-10-CM | POA: Diagnosis not present

## 2017-01-28 ENCOUNTER — Emergency Department (HOSPITAL_COMMUNITY)
Admission: EM | Admit: 2017-01-28 | Discharge: 2017-01-28 | Disposition: A | Discharge status: Home / Self Care | Payer: Medicare Other | Attending: Emergency Medicine | Admitting: Emergency Medicine

## 2017-01-28 ENCOUNTER — Encounter (HOSPITAL_COMMUNITY): Payer: Self-pay

## 2017-01-28 ENCOUNTER — Other Ambulatory Visit: Payer: Self-pay

## 2017-01-28 ENCOUNTER — Emergency Department (HOSPITAL_COMMUNITY): Payer: Medicare Other

## 2017-01-28 ENCOUNTER — Telehealth (INDEPENDENT_AMBULATORY_CARE_PROVIDER_SITE_OTHER): Payer: Self-pay | Admitting: Orthopedic Surgery

## 2017-01-28 DIAGNOSIS — I10 Essential (primary) hypertension: Secondary | ICD-10-CM | POA: Diagnosis not present

## 2017-01-28 DIAGNOSIS — S82201A Unspecified fracture of shaft of right tibia, initial encounter for closed fracture: Secondary | ICD-10-CM | POA: Diagnosis not present

## 2017-01-28 DIAGNOSIS — Z7984 Long term (current) use of oral hypoglycemic drugs: Secondary | ICD-10-CM | POA: Insufficient documentation

## 2017-01-28 DIAGNOSIS — W19XXXA Unspecified fall, initial encounter: Secondary | ICD-10-CM

## 2017-01-28 DIAGNOSIS — M79604 Pain in right leg: Secondary | ICD-10-CM | POA: Diagnosis not present

## 2017-01-28 DIAGNOSIS — E119 Type 2 diabetes mellitus without complications: Secondary | ICD-10-CM | POA: Insufficient documentation

## 2017-01-28 DIAGNOSIS — G8911 Acute pain due to trauma: Secondary | ICD-10-CM | POA: Diagnosis not present

## 2017-01-28 DIAGNOSIS — Z7982 Long term (current) use of aspirin: Secondary | ICD-10-CM | POA: Diagnosis not present

## 2017-01-28 DIAGNOSIS — Z8546 Personal history of malignant neoplasm of prostate: Secondary | ICD-10-CM | POA: Insufficient documentation

## 2017-01-28 DIAGNOSIS — S8990XA Unspecified injury of unspecified lower leg, initial encounter: Secondary | ICD-10-CM | POA: Diagnosis not present

## 2017-01-28 MED ORDER — HYDROCODONE-ACETAMINOPHEN 5-325 MG PO TABS: 2.0000 | ORAL_TABLET | ORAL | 0 refills | Status: DC | PRN

## 2017-01-28 MED ORDER — OXYCODONE-ACETAMINOPHEN 5-325 MG PO TABS
1.0000 | ORAL_TABLET | Freq: Once | ORAL | Status: AC
  Administered 2017-01-28: 1 via ORAL
  Filled 2017-01-28: qty 1

## 2017-01-28 NOTE — ED Triage Notes (Signed)
Patient BIB [facility] from restaurant - patient slipped on water and fell. Patient broke his right ankle in January and was wearing his boot PRN for support. Patient is complaining today to Right ankle 8/10. Patient has history of HTN, DM. Patient denies LOC. Patient denies neck pain.   EMS BP170/90, CBG 173, HR 65, O2:98% RA

## 2017-01-28 NOTE — Telephone Encounter (Signed)
Patient returned call asked for a call back states the call got disconnected.

## 2017-01-28 NOTE — Telephone Encounter (Signed)
Patient is currently sitting in the waiting room at the hospital, he said he was at [employer] this morning and steped in a puddle of water walking in and did a split and injured his previously broken leg. He said he has been waiting for a few hours now for an xray and was wondering if there was anything you could do to speed up the process of getting him back. CB # [phone]

## 2017-01-28 NOTE — Telephone Encounter (Signed)
I called and spoke with patient once. I had placed him on hold to have [name] look over his xrays. He phone number now says he is not accepting calls at this time. Per MD, xrays look fine, no evidence of new fracture. Advised to place in fx boot and have in follow up in the office in 1-2 weeks. Weightbearing as tolerated in boot, can use crutches if needed. Tried calling hospital but operator was busy numerous times.

## 2017-01-28 NOTE — ED Provider Notes (Signed)
[facility] DEPT Provider Note   CSN: [id number] Arrival date & time: 01/28/17  1307     History   Chief Complaint Chief Complaint  Patient presents with  . Fall  . Ankle Pain    right    HPI [name] is a 61 y.o. male.  HPI  Mr. Zielke is a 61yo with a history of hypertension, type 2 diabetes, depression, prostate cancer who presents the emergency department for evaluation of right lower leg pain.  Per record, patient sustained a closed, nondisplaced spiral fracture of the right tibia 03/2016.  It was treated nonsurgically with splinting and closed reduction. Patient states that on his last checkup September, 2018 with [name] he was told that healing was nearly complete and he no longer needed to wear his boot.  Since that time he has been wearing his boot intermittently for comfort purposes.  States that today his right foot slipped out in front of him on a puddle of water at [employer].  He fell backwards onto his buttocks.  Denies hitting his head or loss of consciousness.  Since that time he reports moderate pain over the medial aspect of the left lower leg which is constant and "throbbing" in nature.  His symptoms are worsened with weightbearing, or moving the ankle in any direction. He has not taken any over-the-counter medications for his pain.  Denies numbness, weakness, fever, injury elsewhere.  Past Medical History:  Diagnosis Date  . Depression   . GERD (gastroesophageal reflux disease)   . Hemorrhoids   . Hypertension   . Macular degeneration   . Microcytic anemia   . Prostate cancer ([place]) 08/2009  . Tubular adenoma     Patient Active Problem List   Diagnosis Date Noted  . Complicated urinary tract infection 09/06/2016  . Nephrolithiasis 08/01/2016  . Ureteral trauma, subsequent encounter 08/01/2016  . Rectal bleeding 07/31/2016  . Uncontrolled type 2 diabetes mellitus with hyperglycemia ([place]) 07/31/2016  . Urolithiasis  07/31/2016  . Right ureteral stone   . Type 2 diabetes mellitus without complication, without long-term current use of insulin ([place]) 05/29/2016  . Dyslipidemia associated with type 2 diabetes mellitus ([place]) 05/29/2016  . Closed nondisplaced spiral fracture of shaft of right tibia   . Right tibial fracture 03/22/2016  . Microcytic anemia 02/17/2012  . Prostate cancer ([place]) 02/17/2012  . 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 Benign hypertension 02/17/2012  . Depressive disorder 02/17/2012    Past Surgical History:  Procedure Laterality Date  . INGUINAL HERNIA REPAIR    . Left ankle joint fusion  1981  . PROSTATE BIOPSY     x 2       Home Medications    Prior to Admission medications   Medication Sig Start Date End Date Taking? Authorizing Provider  acetaminophen (TYLENOL) 500 MG tablet Take 500 mg by mouth every 6 (six) hours as needed for mild pain or moderate pain.    [provider]  aspirin EC 81 MG tablet Take 81 mg by mouth daily.    [provider]  HYDROcodone-acetaminophen (NORCO) 5-325 MG tablet Take 1 tablet by mouth every 6 (six) hours as needed for severe pain. 10/23/16   Newt Minion, MD  HYDROcodone-acetaminophen (NORCO/VICODIN) 5-325 MG tablet Take 1 tablet by mouth 2 (two) times daily as needed for moderate pain. 12/05/16   Newt Minion, MD  HYDROcodone-acetaminophen (NORCO/VICODIN) 5-325 MG tablet Take 2 tablets every 4 (four) hours as needed  by mouth. 01/28/17   Glyn Ade, PA-C  LORazepam (ATIVAN) 0.5 MG tablet Take 0.5-1 mg by mouth 2 (two) times daily as needed for anxiety.     [provider]  metFORMIN (GLUCOPHAGE) 1000 MG tablet Take 1,000 mg by mouth 2 (two) times daily with a meal.    [provider]  metoprolol tartrate (LOPRESSOR) 50 MG tablet Take 50 mg by mouth 2 (two) times daily.    [provider]  mirabegron ER (MYRBETRIQ) 50 MG TB24 tablet Take 1 tablet (50 mg total) by mouth daily. 09/11/16    [name], MD  Multiple Vitamin (MULTIVITAMIN) tablet Take 1 tablet by mouth daily.    [provider]  olmesartan-hydrochlorothiazide (BENICAR HCT) 40-25 MG tablet Take 1 tablet by mouth daily.    [provider]  polyethylene glycol (MIRALAX / GLYCOLAX) packet Take 17 g by mouth daily. Stop taking if having more than 2 soft/loose bowel movements daily. Patient taking differently: Take 17 g by mouth daily as needed for moderate constipation. Stop taking if having more than 2 soft/loose bowel movements daily. 08/08/16   [name], MD  promethazine (PHENERGAN) 25 MG tablet Take 25 mg by mouth every 8 (eight) hours as needed for nausea or vomiting.    [provider]  senna-docusate (SENOKOT-S) 8.6-50 MG tablet Take 1 tablet by mouth at bedtime. 08/08/16   [name], MD  tamsulosin (FLOMAX) 0.4 MG CAPS capsule Take 1 capsule (0.4 mg total) by mouth daily. 09/11/16   [name], MD    Family History Family History  Problem Relation Age of Onset  . Lung cancer Mother        Deceased  . Throat cancer Brother   . Pancreatic cancer Father   . Heart disease Father        Deceased  . Lung cancer Maternal Uncle        nephew  . Diabetes Other     Social History Social History   Tobacco Use  . Smoking status: Never Smoker  . Smokeless tobacco: Never Used  Substance Use Topics  . Alcohol use: Yes    Comment: Occasional  . Drug use: No     Allergies   Patient has no known allergies.   Review of Systems Review of Systems  Constitutional: Negative for fever.  Musculoskeletal: Positive for arthralgias (right medial lower leg) and gait problem (painful). Negative for joint swelling.  Skin: Negative for color change and wound.  Neurological: Negative for weakness and numbness.     Physical Exam Updated Vital Signs BP (!) 150/100 (BP Location: Left Arm)   Pulse 74   Temp 98.3 F (36.8 C) (Oral)   Resp 20   SpO2 100%     Physical Exam  Constitutional: He is oriented to person, place, and time. He appears well-developed and well-nourished. No distress.  HENT:  Head: Normocephalic and atraumatic.  Eyes: Right eye exhibits no discharge. Left eye exhibits no discharge.  Pulmonary/Chest: Effort normal. No respiratory distress.  Musculoskeletal:  Right medial shin with mild tenderness over the distal aspect. No deformity appreciated. No overlying erythema, edema, ecchymosis, warmth. No break in skin. No tenderness over the lateral or medial malleolus. Full active ROM of the knee and ankle joint. No pain to fifth metatarsal area or navicular region. Achilles intact. Good pedal pulse and cap refill of toes. Sensation grossly intact.   Neurological: He is alert and oriented to person, place, and time.  Coordination normal.  Skin: Skin is warm and dry. Capillary refill takes less than 2 seconds. He is not diaphoretic.  Psychiatric: He has a normal mood and affect. His behavior is normal.  Nursing note and vitals reviewed.    ED Treatments / Results  Labs (all labs ordered are listed, but only abnormal results are displayed) Labs Reviewed - No data to display  EKG  EKG Interpretation None       Radiology Dg Ankle Complete Right  Result Date: 01/28/2017 CLINICAL DATA:  Fall.  Prior ankle fracture. EXAM: RIGHT ANKLE - COMPLETE 3+ VIEW COMPARISON:  12/05/2016 FINDINGS: Mildly comminuted distal tibial diaphyseal fracture with at least some regions of nonunion. There is some faint linear lucency along the upper margin of the original fracture, less of wide than before and probably due to healing response. On the frontal projection this seems to raise the possibility of an acute superimposed nondisplaced fracture along the upper margin of the original fracture plane, but given the underlying nonunion I am not convinced that there is an acute superimposed fracture. Healing fracture of the distal fibular diaphysis  with some bony bridging. Bony demineralization.  Vascular calcifications. IMPRESSION: 1. Further healing response of distal tibial diaphyseal fracture with at least some nonunion along the comminuted fracture site. Linear low-density along the upper margin of the original fracture site is probably related to the original fracture rather than an acute re- fracture. If the patient has a significant change in ability to weight bear then CT may be warranted to differentiate acute nondisplaced re- fracture from nonunion. 2. The fibular fracture demonstrates bony bridging and expected healing response without nonunion. 3. Vascular calcification. Electronically Signed   By: Van Clines M.D.   On: 01/28/2017 15:25    Procedures Procedures (including critical care time)  Medications Ordered in ED Medications  oxyCODONE-acetaminophen (PERCOCET/ROXICET) 5-325 MG per tablet 1 tablet (1 tablet Oral Given 01/28/17 1551)     Initial Impression / Assessment and Plan / ED Course  I have reviewed the triage vital signs and the nursing notes.  Pertinent labs & imaging results that were available during my care of the patient were reviewed by me and considered in my medical decision making (see chart for details).      Xray reveals healing response to previous distal tibial fracture, no obvious new fracture. Patient is able to bear weight on this foot despite pain. Will have patient continue to wear the boot until he can get a follow up appointment with his orthopedist, [name] for potential need for follow up imaging. Leg is neurovascularly intact. Have sent him home with a short course of Norco, checked the [place] database and he has not had a script for this since 9/20. Discussed that this medicine can make him drowsy and he should avoid driving, working or drinking alcohol while taking it. Counselled him on RICE protocol and patient agrees. His blood pressure was elevated in the ER today, counseled him to  have this rechecked at his primary care office.  Discussed return precautions.  Patient agrees and voiced understanding to the above plan.  Discussed patient with Dr. Roderic Palau who agrees with plan.  Final Clinical Impressions(s) / ED Diagnoses   Final diagnoses:  Fall, initial encounter  Right leg pain    ED Discharge Orders        Ordered    HYDROcodone-acetaminophen (NORCO/VICODIN) 5-325 MG tablet  Every 4 hours PRN     01/28/17 1612  Glyn Ade, PA-C 01/28/17 1635    Milton Ferguson, MD 01/28/17 2317

## 2017-01-28 NOTE — Discharge Instructions (Signed)
These call and schedule appointment with [name] for follow-up on your fall today.  Continue to wear the boot on her right leg until you see [name].  Elevate your right leg and apply ice when you get home.   I wrote you a prescription for pain medicine for the next several days.  This medicine can make you drowsy, please do not drive while taking this medicine.  Return to the emergency department if you develop numbness in which you can no longer feel your feet or toes or have any new or worsening symptoms.

## 2017-01-29 ENCOUNTER — Telehealth (INDEPENDENT_AMBULATORY_CARE_PROVIDER_SITE_OTHER): Payer: Self-pay | Admitting: Orthopedic Surgery

## 2017-01-29 NOTE — Telephone Encounter (Signed)
error 

## 2017-01-29 NOTE — Telephone Encounter (Signed)
Patient was called back three times yesterday. Message already complete.

## 2017-01-31 ENCOUNTER — Telehealth (INDEPENDENT_AMBULATORY_CARE_PROVIDER_SITE_OTHER): Payer: Self-pay | Admitting: Orthopedic Surgery

## 2017-01-31 NOTE — Telephone Encounter (Signed)
Pt just needs some advice on what he is allowed to do as far as walking since he fell and reinjured his leg.   Please call patient back to assist    # [phone]

## 2017-01-31 NOTE — Telephone Encounter (Signed)
I called and lm on vm to advise that he should limit weight bearing on limb until eval in office especially if having pain. To call with any questions.

## 2017-02-10 ENCOUNTER — Ambulatory Visit (INDEPENDENT_AMBULATORY_CARE_PROVIDER_SITE_OTHER): Payer: Medicare Other | Admitting: Orthopedic Surgery

## 2017-02-18 ENCOUNTER — Ambulatory Visit (INDEPENDENT_AMBULATORY_CARE_PROVIDER_SITE_OTHER): Payer: Medicare Other | Admitting: Orthopedic Surgery

## 2017-02-19 ENCOUNTER — Ambulatory Visit (INDEPENDENT_AMBULATORY_CARE_PROVIDER_SITE_OTHER): Payer: Medicare Other | Admitting: Family

## 2017-03-17 ENCOUNTER — Ambulatory Visit (INDEPENDENT_AMBULATORY_CARE_PROVIDER_SITE_OTHER): Payer: Medicare Other | Admitting: Family

## 2017-03-20 ENCOUNTER — Encounter: Payer: Self-pay | Admitting: Gastroenterology

## 2017-03-24 ENCOUNTER — Ambulatory Visit (INDEPENDENT_AMBULATORY_CARE_PROVIDER_SITE_OTHER): Payer: Medicare Other | Admitting: Family

## 2017-05-09 ENCOUNTER — Telehealth (INDEPENDENT_AMBULATORY_CARE_PROVIDER_SITE_OTHER): Payer: Self-pay | Admitting: Orthopedic Surgery

## 2017-05-09 NOTE — Telephone Encounter (Signed)
I called and sw pt. He was questioning if he possibly could have re injured his ankle after a fall and if swelling and pain could just mean that he sprained his ankle. I advised pt that we would have to evaluate in the office to know conclusively what has happened. Offered appts and pt will come in 3/4 at 1:45

## 2017-05-09 NOTE — Telephone Encounter (Signed)
Patient would like to ask you a question.  Please call patient.  CB#[phone].  Thank  You.

## 2017-05-19 ENCOUNTER — Ambulatory Visit (INDEPENDENT_AMBULATORY_CARE_PROVIDER_SITE_OTHER): Payer: Medicare Other | Admitting: Orthopedic Surgery

## 2017-05-26 DIAGNOSIS — C61 Malignant neoplasm of prostate: Secondary | ICD-10-CM | POA: Diagnosis not present

## 2017-06-04 ENCOUNTER — Ambulatory Visit (INDEPENDENT_AMBULATORY_CARE_PROVIDER_SITE_OTHER): Payer: Medicare Other | Admitting: Orthopedic Surgery

## 2017-06-04 DIAGNOSIS — C61 Malignant neoplasm of prostate: Secondary | ICD-10-CM | POA: Diagnosis not present

## 2017-06-04 DIAGNOSIS — N529 Male erectile dysfunction, unspecified: Secondary | ICD-10-CM | POA: Diagnosis not present

## 2017-06-04 DIAGNOSIS — N401 Enlarged prostate with lower urinary tract symptoms: Secondary | ICD-10-CM | POA: Diagnosis not present

## 2017-06-04 DIAGNOSIS — R8290 Unspecified abnormal findings in urine: Secondary | ICD-10-CM | POA: Diagnosis not present

## 2017-06-04 DIAGNOSIS — N138 Other obstructive and reflux uropathy: Secondary | ICD-10-CM | POA: Diagnosis not present

## 2017-06-11 DIAGNOSIS — C61 Malignant neoplasm of prostate: Secondary | ICD-10-CM | POA: Diagnosis not present

## 2017-06-20 ENCOUNTER — Telehealth (INDEPENDENT_AMBULATORY_CARE_PROVIDER_SITE_OTHER): Payer: Self-pay | Admitting: Orthopedic Surgery

## 2017-06-20 DIAGNOSIS — I10 Essential (primary) hypertension: Secondary | ICD-10-CM | POA: Diagnosis not present

## 2017-06-20 DIAGNOSIS — E119 Type 2 diabetes mellitus without complications: Secondary | ICD-10-CM | POA: Diagnosis not present

## 2017-06-20 DIAGNOSIS — E559 Vitamin D deficiency, unspecified: Secondary | ICD-10-CM | POA: Diagnosis not present

## 2017-06-20 DIAGNOSIS — F411 Generalized anxiety disorder: Secondary | ICD-10-CM | POA: Diagnosis not present

## 2017-06-20 DIAGNOSIS — R369 Urethral discharge, unspecified: Secondary | ICD-10-CM | POA: Diagnosis not present

## 2017-06-20 NOTE — Telephone Encounter (Signed)
Patient called wanting to ask you a few questions, wouldn't go into much detail. Just whenever you get a chance give him a call at [phone] Thank you

## 2017-06-23 NOTE — Telephone Encounter (Signed)
I tried calling the pt and I could hear the pt talking but he was not responding to me he was talking to someone else. I will hold message and try and reach pt again later.

## 2017-06-24 NOTE — Telephone Encounter (Signed)
I called and sw pt and he states that he has been having pain in his ankle and difficulty walking and that he has been wearing his fx boot and that it has not been hurting as badly. I advised the pt that he should come in for an appt and this was sch for Thursday of next week 9:15

## 2017-07-03 ENCOUNTER — Ambulatory Visit (INDEPENDENT_AMBULATORY_CARE_PROVIDER_SITE_OTHER): Payer: Medicare Other | Admitting: Orthopedic Surgery

## 2017-07-03 ENCOUNTER — Ambulatory Visit (INDEPENDENT_AMBULATORY_CARE_PROVIDER_SITE_OTHER): Payer: Medicare Other

## 2017-07-03 ENCOUNTER — Encounter (INDEPENDENT_AMBULATORY_CARE_PROVIDER_SITE_OTHER): Payer: Self-pay | Admitting: Orthopedic Surgery

## 2017-07-03 DIAGNOSIS — S82244S Nondisplaced spiral fracture of shaft of right tibia, sequela: Secondary | ICD-10-CM

## 2017-07-03 DIAGNOSIS — M79604 Pain in right leg: Secondary | ICD-10-CM

## 2017-07-03 NOTE — Progress Notes (Signed)
Office Visit Note   Patient: [name]           Date of Birth: 1955-10-02           MRN: [id number] Visit Date: 07/03/2017              Requested by: Jonathon Jordan, MD [place], [place] [address] PCP: Jonathon Jordan, MD  Chief Complaint  Patient presents with  . Right Leg - Pain      HPI: Patient is a 62 year old gentleman who presents status post a slipping injury on black ice back in January.  He states that he was at [employer] he stepped on black ice sustained a sprain to the medial gastrocnemius muscle of the right leg.  Patient is concerned that his leg may be weaker due to the fracture and is concerned that the fracture may have been injured by the slipping.  Assessment & Plan: Visit Diagnoses:  1. Pain in right leg   2. Closed nondisplaced spiral fracture of shaft of right tibia, sequela     Plan: Radiographs shows excellent healing his fracture site is actually stronger than the rest of his leg he has no restrictions at this time.  Patient was given instructions for exercising on a stationary bike to help improve his aerobic function and improve his microcirculation.  Discussed that the sprain of the gastrocnemius muscle is healed nicely and requires no intervention.  Follow-Up Instructions: Return if symptoms worsen or fail to improve.   Ortho Exam  Patient is alert, oriented, no adenopathy, well-dressed, normal affect, normal respiratory effort. Examination patient has an antalgic gait he is got good pulses he has good ankle and range of motion of the knee.  He is minimally tender to palpation over the muscular tendinous junction of the gastrocnemius muscle medial head consistent with a previous sprain.  The tibia is nontender to palpation.  There are no skin breakdown no lesions.  Imaging: Xr Tibia/fibula Right  Result Date: 07/03/2017 2 view radiographs of the right tibia and fibula shows calcification of the anterior tibial  artery the fracture site is well healed with good callus formation with no complicating features no malalignment.  No images are attached to the encounter.  Labs: Lab Results  Component Value Date   HGBA1C 7.6 06/26/2016   HGBA1C 7.6 06/26/2016   HGBA1C 8.2 (H) 03/23/2016   LABURIC 8.0 (H) 03/25/2016   REPTSTATUS 10/02/2016 FINAL 09/30/2016   CULT MULTIPLE SPECIES PRESENT, SUGGEST RECOLLECTION (A) 09/30/2016   LABORGA ENTEROCOCCUS FAECALIS (A) 09/06/2016    @LABSALLVALUES (HGBA1)@  There is no height or weight on file to calculate BMI.  Orders:  Orders Placed This Encounter  Procedures  . XR Tibia/Fibula Right   No orders of the defined types were placed in this encounter.    Procedures: No procedures performed  Clinical Data: No additional findings.  ROS:  All other systems negative, except as noted in the HPI. Review of Systems  Objective: Vital Signs: There were no vitals taken for this visit.  Specialty Comments:  No specialty comments available.  PMFS History: Patient Active Problem List   Diagnosis Date Noted  . Complicated urinary tract infection 09/06/2016  . Nephrolithiasis 08/01/2016  . Ureteral trauma, subsequent encounter 08/01/2016  . Rectal bleeding 07/31/2016  . Uncontrolled type 2 diabetes mellitus with hyperglycemia ([place]) 07/31/2016  . Urolithiasis 07/31/2016  . Right ureteral stone   . Type 2 diabetes mellitus without complication, without long-term current  use of insulin ([place]) 05/29/2016  . Dyslipidemia associated with type 2 diabetes mellitus ([place]) 05/29/2016  . Closed nondisplaced spiral fracture of shaft of right tibia   . Right tibial fracture 03/22/2016  . Microcytic anemia 02/17/2012  . Prostate cancer ([place]) 02/17/2012  . 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 Benign hypertension 02/17/2012  . Depressive disorder 02/17/2012   Past Medical History:  Diagnosis Date  . Depression   . GERD (gastroesophageal reflux disease)   .  Hemorrhoids   . Hypertension   . Macular degeneration   . Microcytic anemia   . Prostate cancer ([place]) 08/2009  . Tubular adenoma     Family History  Problem Relation Age of Onset  . Lung cancer Mother        Deceased  . Throat cancer Brother   . Pancreatic cancer Father   . Heart disease Father        Deceased  . Lung cancer Maternal Uncle        nephew  . Diabetes Other     Past Surgical History:  Procedure Laterality Date  . CYSTOSCOPY WITH RETROGRADE PYELOGRAM, URETEROSCOPY AND STENT PLACEMENT Right 07/31/2016   Procedure: CYSTOSCOPY WITH RIGHT  RETROGRADE PYELOGRAM, URETEROSCOPY;  Surgeon: [name], MD;  Location: [facility];  Service: Urology;  Laterality: Right;  . INGUINAL HERNIA REPAIR    . Left ankle joint fusion  1981  . PROSTATE BIOPSY     x 2  . URETERAL REIMPLANTION  07/31/2016   Procedure: URETERAL REIMPLANT, right boari flap, right psoas hitch;  Surgeon: [name], MD;  Location: [facility];  Service: Urology;;   Social History   Occupational History  . Not on file  Tobacco Use  . Smoking status: Never Smoker  . Smokeless tobacco: Never Used  Substance and Sexual Activity  . Alcohol use: Yes    Comment: Occasional  . Drug use: No  . Sexual activity: Not Currently

## 2017-07-14 DIAGNOSIS — C61 Malignant neoplasm of prostate: Secondary | ICD-10-CM | POA: Diagnosis not present

## 2017-07-14 DIAGNOSIS — R8290 Unspecified abnormal findings in urine: Secondary | ICD-10-CM | POA: Diagnosis not present

## 2017-07-14 DIAGNOSIS — N401 Enlarged prostate with lower urinary tract symptoms: Secondary | ICD-10-CM | POA: Diagnosis not present

## 2017-07-14 DIAGNOSIS — N138 Other obstructive and reflux uropathy: Secondary | ICD-10-CM | POA: Diagnosis not present

## 2017-07-15 DIAGNOSIS — E1142 Type 2 diabetes mellitus with diabetic polyneuropathy: Secondary | ICD-10-CM | POA: Diagnosis not present

## 2017-07-22 DIAGNOSIS — N138 Other obstructive and reflux uropathy: Secondary | ICD-10-CM | POA: Diagnosis not present

## 2017-07-22 DIAGNOSIS — C61 Malignant neoplasm of prostate: Secondary | ICD-10-CM | POA: Diagnosis not present

## 2017-07-22 DIAGNOSIS — Z192 Hormone resistant malignancy status: Secondary | ICD-10-CM | POA: Diagnosis not present

## 2017-07-22 DIAGNOSIS — N401 Enlarged prostate with lower urinary tract symptoms: Secondary | ICD-10-CM | POA: Diagnosis not present

## 2017-07-22 DIAGNOSIS — N529 Male erectile dysfunction, unspecified: Secondary | ICD-10-CM | POA: Diagnosis not present

## 2017-09-29 DIAGNOSIS — E119 Type 2 diabetes mellitus without complications: Secondary | ICD-10-CM | POA: Diagnosis not present

## 2017-09-29 DIAGNOSIS — I10 Essential (primary) hypertension: Secondary | ICD-10-CM | POA: Diagnosis not present

## 2017-09-29 DIAGNOSIS — H6123 Impacted cerumen, bilateral: Secondary | ICD-10-CM | POA: Diagnosis not present

## 2017-09-29 DIAGNOSIS — C61 Malignant neoplasm of prostate: Secondary | ICD-10-CM | POA: Diagnosis not present

## 2017-10-07 ENCOUNTER — Telehealth (INDEPENDENT_AMBULATORY_CARE_PROVIDER_SITE_OTHER): Payer: Self-pay | Admitting: Orthopedic Surgery

## 2017-10-07 NOTE — Telephone Encounter (Signed)
Talked with patient and patient stated that he has an appointment on 10/15/2017 to see [name] for ankle pain.  Advised patient to call back if ankle does not get any better.

## 2017-10-07 NOTE — Telephone Encounter (Signed)
Patient called and would like someone to give him a call.  CB#[phone].  Thank you.

## 2017-10-11 ENCOUNTER — Other Ambulatory Visit: Payer: Self-pay

## 2017-10-11 ENCOUNTER — Encounter (HOSPITAL_COMMUNITY): Payer: Self-pay | Admitting: Emergency Medicine

## 2017-10-11 ENCOUNTER — Emergency Department (HOSPITAL_COMMUNITY): Payer: Medicare Other

## 2017-10-11 ENCOUNTER — Emergency Department (HOSPITAL_COMMUNITY)
Admission: EM | Admit: 2017-10-11 | Discharge: 2017-10-11 | Disposition: A | Discharge status: Home / Self Care | Payer: Medicare Other | Attending: Emergency Medicine | Admitting: Emergency Medicine

## 2017-10-11 DIAGNOSIS — Z7982 Long term (current) use of aspirin: Secondary | ICD-10-CM | POA: Insufficient documentation

## 2017-10-11 DIAGNOSIS — D075 Carcinoma in situ of prostate: Secondary | ICD-10-CM | POA: Insufficient documentation

## 2017-10-11 DIAGNOSIS — Z7984 Long term (current) use of oral hypoglycemic drugs: Secondary | ICD-10-CM | POA: Insufficient documentation

## 2017-10-11 DIAGNOSIS — R52 Pain, unspecified: Secondary | ICD-10-CM | POA: Diagnosis not present

## 2017-10-11 DIAGNOSIS — M25571 Pain in right ankle and joints of right foot: Secondary | ICD-10-CM | POA: Diagnosis not present

## 2017-10-11 DIAGNOSIS — E119 Type 2 diabetes mellitus without complications: Secondary | ICD-10-CM | POA: Diagnosis not present

## 2017-10-11 DIAGNOSIS — I10 Essential (primary) hypertension: Secondary | ICD-10-CM | POA: Insufficient documentation

## 2017-10-11 MED ORDER — HYDROCODONE-ACETAMINOPHEN 5-325 MG PO TABS
1.0000 | ORAL_TABLET | Freq: Once | ORAL | Status: AC
  Administered 2017-10-11: 1 via ORAL
  Filled 2017-10-11: qty 1

## 2017-10-11 MED ORDER — DICLOFENAC SODIUM 50 MG PO TBEC: 50.0000 mg | DELAYED_RELEASE_TABLET | Freq: Two times a day (BID) | ORAL | 0 refills | Status: DC

## 2017-10-11 MED ORDER — KETOROLAC TROMETHAMINE 15 MG/ML IJ SOLN
15.0000 mg | Freq: Once | INTRAMUSCULAR | Status: AC
  Administered 2017-10-11: 15 mg via INTRAMUSCULAR
  Filled 2017-10-11: qty 1

## 2017-10-11 NOTE — ED Provider Notes (Signed)
[facility] DEPT Provider Note   CSN: [id number] Arrival date & time: 10/11/17  1306     History   Chief Complaint Chief Complaint  Patient presents with  . Ankle Pain    R    HPI [name] is a 62 y.o. male who presents to the ED with right ankle pain. Patient reports that he broke his lower leg January 2018 and has had pain off and on since then. Patient reports that he twisted his ankle about 3 weeks ago but did not think he hurt it that bad. He has an appointment for f/u with [name] (orthopedic) in 4 days. Patient reports that over the past fever days the pain in the right ankle has increased and it is painful to bear weight. Patient reports this is the same pain and some place as when he saw [name] in the past.  HPI  Past Medical History:  Diagnosis Date  . Depression   . GERD (gastroesophageal reflux disease)   . Hemorrhoids   . Hypertension   . Macular degeneration   . Microcytic anemia   . Prostate cancer ([place]) 08/2009  . Tubular adenoma     Patient Active Problem List   Diagnosis Date Noted  . Complicated urinary tract infection 09/06/2016  . Nephrolithiasis 08/01/2016  . Ureteral trauma, subsequent encounter 08/01/2016  . Rectal bleeding 07/31/2016  . Uncontrolled type 2 diabetes mellitus with hyperglycemia ([place]) 07/31/2016  . Urolithiasis 07/31/2016  . Right ureteral stone   . Type 2 diabetes mellitus without complication, without long-term current use of insulin ([place]) 05/29/2016  . Dyslipidemia associated with type 2 diabetes mellitus ([place]) 05/29/2016  . Closed nondisplaced spiral fracture of shaft of right tibia   . Right tibial fracture 03/22/2016  . Microcytic anemia 02/17/2012  . Prostate cancer ([place]) 02/17/2012  . 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 Benign hypertension 02/17/2012  . Depressive disorder 02/17/2012    Past Surgical History:  Procedure Laterality Date  . CYSTOSCOPY WITH RETROGRADE PYELOGRAM,  URETEROSCOPY AND STENT PLACEMENT Right 07/31/2016   Procedure: CYSTOSCOPY WITH RIGHT  RETROGRADE PYELOGRAM, URETEROSCOPY;  Surgeon: [name], MD;  Location: [facility];  Service: Urology;  Laterality: Right;  . INGUINAL HERNIA REPAIR    . Left ankle joint fusion  1981  . PROSTATE BIOPSY     x 2  . URETERAL REIMPLANTION  07/31/2016   Procedure: URETERAL REIMPLANT, right boari flap, right psoas hitch;  Surgeon: [name], MD;  Location: [facility];  Service: Urology;;        Home Medications    Prior to Admission medications   Medication Sig Start Date End Date Taking? Authorizing Provider  acetaminophen (TYLENOL) 500 MG tablet Take 500 mg by mouth every 6 (six) hours as needed for mild pain or moderate pain.    [provider]  aspirin EC 81 MG tablet Take 81 mg by mouth daily.    [provider]  diclofenac (VOLTAREN) 50 MG EC tablet Take 1 tablet (50 mg total) by mouth 2 (two) times daily. 10/11/17   [name], NP  HYDROcodone-acetaminophen (NORCO/VICODIN) 5-325 MG tablet Take 2 tablets every 4 (four) hours as needed by mouth. 01/28/17   [name], [name], PA-C  LORazepam (ATIVAN) 0.5 MG tablet Take 0.5-1 mg by mouth 2 (two) times daily as needed for anxiety.     [provider]  metFORMIN (GLUCOPHAGE) 1000 MG tablet Take 1,000 mg by mouth 2 (two)  times daily with a meal.    [provider]  metoprolol tartrate (LOPRESSOR) 50 MG tablet Take 50 mg by mouth 2 (two) times daily.    [provider]  mirabegron ER (MYRBETRIQ) 50 MG TB24 tablet Take 1 tablet (50 mg total) by mouth daily. 09/11/16   [name], MD  Multiple Vitamin (MULTIVITAMIN) tablet Take 1 tablet by mouth daily.    [provider]  olmesartan-hydrochlorothiazide (BENICAR HCT) 40-25 MG tablet Take 1 tablet by mouth daily.    [provider]  polyethylene glycol (MIRALAX / GLYCOLAX) packet Take 17 g by mouth daily. Stop taking if having more  than 2 soft/loose bowel movements daily. Patient taking differently: Take 17 g by mouth daily as needed for moderate constipation. Stop taking if having more than 2 soft/loose bowel movements daily. 08/08/16   [name], MD  promethazine (PHENERGAN) 25 MG tablet Take 25 mg by mouth every 8 (eight) hours as needed for nausea or vomiting.    [provider]  senna-docusate (SENOKOT-S) 8.6-50 MG tablet Take 1 tablet by mouth at bedtime. 08/08/16   [name], MD  tamsulosin (FLOMAX) 0.4 MG CAPS capsule Take 1 capsule (0.4 mg total) by mouth daily. 09/11/16   [name], MD    Family History Family History  Problem Relation Age of Onset  . Lung cancer Mother        Deceased  . Throat cancer Brother   . Pancreatic cancer Father   . Heart disease Father        Deceased  . Lung cancer Maternal Uncle        nephew  . Diabetes Other     Social History Social History   Tobacco Use  . Smoking status: Never Smoker  . Smokeless tobacco: Never Used  Substance Use Topics  . Alcohol use: Yes    Comment: Occasional  . Drug use: No     Allergies   Patient has no known allergies.   Review of Systems Review of Systems  Musculoskeletal: Positive for arthralgias.       Right ankle pain  All other systems reviewed and are negative.    Physical Exam Updated Vital Signs BP (!) 153/106 (BP Location: Right Arm)   Pulse 72   Temp 98.1 F (36.7 C) (Oral)   SpO2 98%   Physical Exam  Constitutional: He appears well-developed and well-nourished. No distress.  HENT:  Head: Normocephalic.  Eyes: EOM are normal.  Neck: Neck supple.  Cardiovascular: Normal rate.  Pulmonary/Chest: Effort normal.  Musculoskeletal:       Right ankle: He exhibits normal range of motion and normal pulse. Swelling: minimal. Tenderness. Medial malleolus tenderness found.  There is an area of ecchymosis to the medial aspect of the right ankle. The area is tender with palpation.  Pedal pulse 2+, adequate circulation. Pain with rang of motion of the ankle.  There is no calf tenderness or upper leg tenderness. There is no erythema or increased warmth.  Neurological: He is alert.  Skin: Skin is warm and dry.  Psychiatric: He has a normal mood and affect.  Nursing note and vitals reviewed.    ED Treatments / Results  Labs (all labs ordered are listed, but only abnormal results are displayed) Labs Reviewed - No data to display  Radiology Dg Ankle Complete Right  Result Date: 10/11/2017 CLINICAL DATA:  Pain for 1 week. History of fracture 1 and half years ago. EXAM: RIGHT ANKLE -  COMPLETE 3+ VIEW COMPARISON:  July 03, 2017 FINDINGS: There is a healed fracture in the distal tibial metaphysis, similar in the interval. There is a healed fracture of the distal fibular diaphysis, also similar in the interval. No acute fracture is seen. Vascular calcifications are noted. No other acute abnormalities. IMPRESSION: Healed fractures of the distal tibia and fibula. No acute fracture noted. Electronically Signed   By: Dorise Bullion III M.D   On: 10/11/2017 14:09    Procedures Procedures (including critical care time)  Medications Ordered in ED Medications  HYDROcodone-acetaminophen (NORCO/VICODIN) 5-325 MG per tablet 1 tablet (1 tablet Oral Given 10/11/17 1449)  ketorolac (TORADOL) 15 MG/ML injection 15 mg (15 mg Intramuscular Given 10/11/17 1450)   Dr. Sedonia Small in to see the patient. Patient concerned about possible DVT. Dr. Sedonia Small discussed with the patient signs and symptoms to look for.   Initial Impression / Assessment and Plan / ED Course  I have reviewed the triage vital signs and the nursing notes. Offered patient ASO but he declined. Offered ace wrap and patient states he will do that at home. Patient reports he just wants something for pain until he sees [name]. Patient stable for d/c without focal neuro deficits and no signs of DVT at this time.   Final Clinical  Impressions(s) / ED Diagnoses   Final diagnoses:  Right ankle pain, unspecified chronicity    ED Discharge Orders        Ordered    diclofenac (VOLTAREN) 50 MG EC tablet  2 times daily     10/11/17 1501       Janit Bern [place], [place] 10/11/17 1505    Maudie Flakes, MD 10/11/17 1635

## 2017-10-11 NOTE — Discharge Instructions (Addendum)
Keep your appointment with [name]. Return here as needed.

## 2017-10-11 NOTE — ED Notes (Signed)
Pt refused ASO ankle, offered ace wrap, patient refused.

## 2017-10-11 NOTE — ED Triage Notes (Signed)
Pt with R ankle injury 3 weeks ago, pt states swelling and pain has worsened over the past several days.

## 2017-10-15 ENCOUNTER — Ambulatory Visit (INDEPENDENT_AMBULATORY_CARE_PROVIDER_SITE_OTHER): Payer: Medicare Other | Admitting: Orthopedic Surgery

## 2017-10-21 ENCOUNTER — Ambulatory Visit (INDEPENDENT_AMBULATORY_CARE_PROVIDER_SITE_OTHER): Payer: Medicare Other | Admitting: Orthopedic Surgery

## 2017-10-29 DIAGNOSIS — H6123 Impacted cerumen, bilateral: Secondary | ICD-10-CM | POA: Diagnosis not present

## 2017-11-03 DIAGNOSIS — E1151 Type 2 diabetes mellitus with diabetic peripheral angiopathy without gangrene: Secondary | ICD-10-CM | POA: Diagnosis not present

## 2017-11-03 DIAGNOSIS — I739 Peripheral vascular disease, unspecified: Secondary | ICD-10-CM | POA: Diagnosis not present

## 2017-11-03 DIAGNOSIS — L84 Corns and callosities: Secondary | ICD-10-CM | POA: Diagnosis not present

## 2017-11-03 DIAGNOSIS — L603 Nail dystrophy: Secondary | ICD-10-CM | POA: Diagnosis not present

## 2017-11-07 DIAGNOSIS — H900 Conductive hearing loss, bilateral: Secondary | ICD-10-CM | POA: Insufficient documentation

## 2017-11-07 DIAGNOSIS — Z7289 Other problems related to lifestyle: Secondary | ICD-10-CM | POA: Diagnosis not present

## 2017-11-07 DIAGNOSIS — H938X3 Other specified disorders of ear, bilateral: Secondary | ICD-10-CM | POA: Diagnosis not present

## 2017-11-07 DIAGNOSIS — L299 Pruritus, unspecified: Secondary | ICD-10-CM | POA: Diagnosis not present

## 2017-11-07 DIAGNOSIS — H6123 Impacted cerumen, bilateral: Secondary | ICD-10-CM | POA: Diagnosis not present

## 2017-11-26 DIAGNOSIS — L570 Actinic keratosis: Secondary | ICD-10-CM | POA: Diagnosis not present

## 2017-11-26 DIAGNOSIS — L821 Other seborrheic keratosis: Secondary | ICD-10-CM | POA: Diagnosis not present

## 2017-11-26 DIAGNOSIS — D485 Neoplasm of uncertain behavior of skin: Secondary | ICD-10-CM | POA: Diagnosis not present

## 2017-11-26 DIAGNOSIS — C4442 Squamous cell carcinoma of skin of scalp and neck: Secondary | ICD-10-CM | POA: Diagnosis not present

## 2017-12-22 DIAGNOSIS — Z85828 Personal history of other malignant neoplasm of skin: Secondary | ICD-10-CM | POA: Diagnosis not present

## 2017-12-22 DIAGNOSIS — C4442 Squamous cell carcinoma of skin of scalp and neck: Secondary | ICD-10-CM | POA: Diagnosis not present

## 2017-12-25 ENCOUNTER — Encounter: Payer: Self-pay | Admitting: Nurse Practitioner

## 2017-12-25 DIAGNOSIS — H6123 Impacted cerumen, bilateral: Secondary | ICD-10-CM

## 2017-12-30 ENCOUNTER — Telehealth: Payer: Self-pay

## 2017-12-30 NOTE — Telephone Encounter (Signed)
Patient stated he has another doctors appointment on the day of his appointment here so he would like to reschedule it for a month out. Appt cancelled/rescheduled. YRL,RMA

## 2017-12-31 ENCOUNTER — Ambulatory Visit: Payer: Medicare Other | Admitting: Nurse Practitioner

## 2018-01-30 ENCOUNTER — Ambulatory Visit: Payer: Self-pay | Admitting: Nurse Practitioner

## 2018-03-02 ENCOUNTER — Telehealth: Payer: Self-pay

## 2018-03-02 NOTE — Telephone Encounter (Signed)
Returned pt call and rescheduled his appointment as requested due to his leg being injured. YRL,RMA

## 2018-03-04 ENCOUNTER — Ambulatory Visit: Payer: Medicare Other | Admitting: Nurse Practitioner

## 2018-03-22 ENCOUNTER — Other Ambulatory Visit: Payer: Self-pay | Admitting: Nurse Practitioner

## 2018-03-26 ENCOUNTER — Encounter: Payer: Self-pay | Admitting: Nurse Practitioner

## 2018-03-26 ENCOUNTER — Ambulatory Visit (INDEPENDENT_AMBULATORY_CARE_PROVIDER_SITE_OTHER): Payer: Medicare Other | Admitting: Nurse Practitioner

## 2018-03-26 VITALS — BP 140/82 | HR 76 | Temp 97.7°F | Ht 64.2 in | Wt 202.6 lb

## 2018-03-26 DIAGNOSIS — I129 Hypertensive chronic kidney disease with stage 1 through stage 4 chronic kidney disease, or unspecified chronic kidney disease: Secondary | ICD-10-CM | POA: Diagnosis not present

## 2018-03-26 DIAGNOSIS — N183 Chronic kidney disease, stage 3 unspecified: Secondary | ICD-10-CM | POA: Insufficient documentation

## 2018-03-26 DIAGNOSIS — I10 Essential (primary) hypertension: Secondary | ICD-10-CM | POA: Insufficient documentation

## 2018-03-26 DIAGNOSIS — N289 Disorder of kidney and ureter, unspecified: Secondary | ICD-10-CM | POA: Diagnosis not present

## 2018-03-26 DIAGNOSIS — R3915 Urgency of urination: Secondary | ICD-10-CM | POA: Diagnosis not present

## 2018-03-26 DIAGNOSIS — E1121 Type 2 diabetes mellitus with diabetic nephropathy: Secondary | ICD-10-CM | POA: Diagnosis not present

## 2018-03-26 LAB — POCT UA - MICROALBUMIN
Albumin/Creatinine Ratio, Urine, POC: 300
CREATININE, POC: 200 mg/dL
MICROALBUMIN (UR) POC: 150 mg/L

## 2018-03-26 LAB — POCT URINALYSIS DIPSTICK
Bilirubin, UA: NEGATIVE
Glucose, UA: POSITIVE — AB
Ketones, UA: NEGATIVE
Nitrite, UA: NEGATIVE
Protein, UA: POSITIVE — AB
Spec Grav, UA: 1.02 (ref 1.010–1.025)
Urobilinogen, UA: 0.2 E.U./dL
pH, UA: 5.5 (ref 5.0–8.0)

## 2018-03-26 MED ORDER — OLMESARTAN MEDOXOMIL 40 MG PO TABS: 40.0000 mg | ORAL_TABLET | Freq: Every day | ORAL | 11 refills | Status: DC

## 2018-03-26 NOTE — Progress Notes (Addendum)
Subjective:     Patient ID: [name] , male    DOB: 1955-08-18 , 63 y.o.   MRN: [id number]   Chief Complaint  Patient presents with  . Diabetes  . Hypertension    HPI  Diabetes  He presents for his follow-up diabetic visit. He has type 2 diabetes mellitus. His disease course has been worsening. Pertinent negatives for hypoglycemia include no confusion, dizziness, headaches or pallor. Pertinent negatives for diabetes include no blurred vision, no chest pain, no polydipsia, no polyphagia and no polyuria. Symptoms are worsening. Risk factors for coronary artery disease include male sex, obesity and sedentary lifestyle. Current diabetic treatment includes oral agent (monotherapy). He is compliant with treatment most of the time. He is following a generally unhealthy diet. He has not had a previous visit with a dietitian. He rarely participates in exercise. His overall blood glucose range is 180-200 mg/dl. An ACE inhibitor/angiotensin II receptor blocker is not being taken. Eye exam is not current (he plans to go this Spring).  Hypertension  This is a chronic problem. The current episode started more than 1 year ago. The problem is unchanged. Pertinent negatives include no anxiety, blurred vision, chest pain, headaches or palpitations. There are no associated agents to hypertension. Risk factors for coronary artery disease include sedentary lifestyle, obesity and male gender. The current treatment provides no improvement. There is no history of angina.     Past Medical History:  Diagnosis Date  . Depression   . GERD (gastroesophageal reflux disease)   . Hemorrhoids   . Hypertension   . Macular degeneration   . Microcytic anemia   . Prostate cancer ([place]) 08/2009  . Tubular adenoma      Family History  Problem Relation Age of Onset  . Lung cancer Mother        Deceased  . Throat cancer Brother   . Pancreatic cancer Father   . Heart disease Father        Deceased  . Lung cancer  Maternal Uncle        nephew  . Diabetes Other      Current Outpatient Medications:  .  acetaminophen (TYLENOL) 500 MG tablet, Take 500 mg by mouth every 6 (six) hours as needed for mild pain or moderate pain., Disp: , Rfl:  .  aspirin EC 81 MG tablet, Take 81 mg by mouth daily., Disp: , Rfl:  .  atorvastatin (LIPITOR) 20 MG tablet, Take 20 mg by mouth daily., Disp: , Rfl:  .  diclofenac (VOLTAREN) 50 MG EC tablet, Take 1 tablet (50 mg total) by mouth 2 (two) times daily., Disp: 15 tablet, Rfl: 0 .  linagliptin (TRADJENTA) 5 MG TABS tablet, Take 5 mg by mouth daily., Disp: , Rfl:  .  metFORMIN (GLUCOPHAGE) 1000 MG tablet, Take 1,000 mg by mouth 2 (two) times daily with a meal., Disp: , Rfl:  .  metoprolol tartrate (LOPRESSOR) 50 MG tablet, Take 50 mg by mouth 2 (two) times daily., Disp: , Rfl:  .  Multiple Vitamin (MULTIVITAMIN) tablet, Take 1 tablet by mouth daily., Disp: , Rfl:  .  olmesartan-hydrochlorothiazide (BENICAR HCT) 40-25 MG tablet, Take 1 tablet by mouth daily., Disp: , Rfl:  .  polyethylene glycol (MIRALAX / GLYCOLAX) packet, Take 17 g by mouth daily. Stop taking if having more than 2 soft/loose bowel movements daily. (Patient taking differently: Take 17 g by mouth daily as needed for moderate constipation. Stop taking if having more than 2 soft/loose bowel  movements daily.), Disp: 30 packet, Rfl: PRN .  zolpidem (AMBIEN) 5 MG tablet, Take 5 mg by mouth at bedtime., Disp: , Rfl:  .  mirabegron ER (MYRBETRIQ) 50 MG TB24 tablet, Take 1 tablet (50 mg total) by mouth daily. (Patient not taking: Reported on 03/26/2018), Disp: 30 tablet, Rfl: 0   No Known Allergies   Review of Systems  Constitutional: Negative.   Eyes: Negative for blurred vision and photophobia.  Respiratory: Negative.  Negative for cough.   Cardiovascular: Negative.  Negative for chest pain, palpitations and leg swelling.  Endocrine: Negative for polydipsia, polyphagia and polyuria.  Genitourinary: Positive for  urgency. Negative for frequency, hematuria, penile pain, penile swelling and scrotal swelling.  Skin: Negative for pallor.  Neurological: Negative for dizziness and headaches.  Psychiatric/Behavioral: Negative for confusion.     Today's Vitals   03/26/18 0912  BP: 140/82  Pulse: 76  Temp: 97.7 F (36.5 C)  TempSrc: Oral  SpO2: 97%  Weight: 202 lb 9.6 oz (91.9 kg)  Height: 5' 4.2" (1.631 m)  PainSc: 0-No pain   Body mass index is 34.56 kg/m.   Objective:  Physical Exam Vitals signs reviewed.  Constitutional:      Appearance: Normal appearance.  Cardiovascular:     Rate and Rhythm: Normal rate.     Pulses: Normal pulses.     Heart sounds: Normal heart sounds. No murmur.  Pulmonary:     Effort: Pulmonary effort is normal.     Breath sounds: Normal breath sounds.  Musculoskeletal:        General: No tenderness.  Skin:    General: Skin is warm and dry.  Neurological:     Mental Status: He is alert.         Assessment And Plan:     1. Essential hypertension . B/P is controlled.  . CMP ordered to check renal function.  . The importance of regular exercise and dietary modification was stressed to the patient.  . Stressed importance of losing ten percent of her body weight to help with B/P control.  . The weight loss would help with decreasing cardiac and cancer risk as well.  - olmesartan (BENICAR) 40 MG tablet; Take 1 tablet (40 mg total) by mouth daily.  Dispense: 30 tablet; Refill: 11 - BMP8+eGFR  2. Type 2 diabetes mellitus with diabetic nephropathy, without long-term current use of insulin (HCC)  Chronic, fair control  Continue with current medications he is to take tradjenta and hold the metformin for now due to his kidney disease  Encouraged to limit intake of sugary foods and drinks  Encouraged to increase physical activity to 150 minutes per week - Hemoglobin A1c - Lipid Profile  3. Urinary urgency  Will check for UTI - small leukocytes, glucose  and protein is present in urine  I will send for urine culture  Advised him to discuss with Urology further about this concern he feels has been occurring since he had his recent surgery on his bladder for the kidney stones  4. Nephropathy  Being followed by Nephrology  5. Chronic kidney disease (CKD), stage III (moderate) ([place])  Being followed by Nephrology  Encouraged to stay well hydrated and avoid NSAIDs      [name], [name]

## 2018-03-27 LAB — LIPID PANEL
CHOLESTEROL TOTAL: 150 mg/dL (ref 100–199)
Chol/HDL Ratio: 4.3 ratio (ref 0.0–5.0)
HDL: 35 mg/dL — ABNORMAL LOW (ref >39)
LDL Calculated: 81 mg/dL (ref 0–99)
TRIGLYCERIDES: 172 mg/dL — AB (ref 0–149)
VLDL Cholesterol Cal: 34 mg/dL (ref 5–40)

## 2018-03-27 LAB — BMP8+EGFR
BUN/Creatinine Ratio: 16 (ref 10–24)
BUN: 24 mg/dL (ref 8–27)
CO2: 24 mmol/L (ref 20–29)
Calcium: 9.7 mg/dL (ref 8.6–10.2)
Chloride: 101 mmol/L (ref 96–106)
Creatinine, Ser: 1.51 mg/dL — ABNORMAL HIGH (ref 0.76–1.27)
GFR calc Af Amer: 56 mL/min/{1.73_m2} — ABNORMAL LOW (ref >59)
GFR calc non Af Amer: 48 mL/min/{1.73_m2} — ABNORMAL LOW (ref >59)
GLUCOSE: 273 mg/dL — AB (ref 65–99)
Potassium: 5.3 mmol/L — ABNORMAL HIGH (ref 3.5–5.2)
Sodium: 140 mmol/L (ref 134–144)

## 2018-03-27 LAB — HEMOGLOBIN A1C
ESTIMATED AVERAGE GLUCOSE: 194 mg/dL
Hgb A1c MFr Bld: 8.4 % — ABNORMAL HIGH (ref 4.8–5.6)

## 2018-04-14 ENCOUNTER — Encounter: Payer: Self-pay | Admitting: Nurse Practitioner

## 2018-04-21 DIAGNOSIS — L821 Other seborrheic keratosis: Secondary | ICD-10-CM | POA: Diagnosis not present

## 2018-04-21 DIAGNOSIS — L570 Actinic keratosis: Secondary | ICD-10-CM | POA: Diagnosis not present

## 2018-04-21 DIAGNOSIS — Z85828 Personal history of other malignant neoplasm of skin: Secondary | ICD-10-CM | POA: Diagnosis not present

## 2018-04-21 DIAGNOSIS — L918 Other hypertrophic disorders of the skin: Secondary | ICD-10-CM | POA: Diagnosis not present

## 2018-05-01 DIAGNOSIS — L600 Ingrowing nail: Secondary | ICD-10-CM | POA: Diagnosis not present

## 2018-05-01 DIAGNOSIS — M71572 Other bursitis, not elsewhere classified, left ankle and foot: Secondary | ICD-10-CM | POA: Diagnosis not present

## 2018-06-24 ENCOUNTER — Ambulatory Visit: Payer: Medicare Other | Admitting: Nurse Practitioner

## 2018-07-08 ENCOUNTER — Ambulatory Visit: Payer: Medicare Other

## 2018-07-14 ENCOUNTER — Other Ambulatory Visit: Payer: Self-pay | Admitting: Nurse Practitioner

## 2018-07-20 ENCOUNTER — Telehealth: Payer: Self-pay | Admitting: Nurse Practitioner

## 2018-07-20 NOTE — Telephone Encounter (Signed)
I left a message asking the patient to call me at [phone] to schedule AWV with [place]. VDM (DD) °

## 2018-07-28 ENCOUNTER — Telehealth: Payer: Self-pay | Admitting: Nurse Practitioner

## 2018-07-28 NOTE — Telephone Encounter (Signed)
I left a message asking the patient to call me at [phone] to schedule appointment. VDM (DD)

## 2018-08-25 ENCOUNTER — Telehealth: Payer: Self-pay

## 2018-08-25 ENCOUNTER — Other Ambulatory Visit: Payer: Self-pay

## 2018-08-25 ENCOUNTER — Ambulatory Visit (INDEPENDENT_AMBULATORY_CARE_PROVIDER_SITE_OTHER): Payer: Medicare Other

## 2018-08-25 VITALS — Ht 65.5 in | Wt 194.0 lb

## 2018-08-25 DIAGNOSIS — Z0000 Encounter for general adult medical examination without abnormal findings: Secondary | ICD-10-CM | POA: Diagnosis not present

## 2018-08-25 DIAGNOSIS — E1121 Type 2 diabetes mellitus with diabetic nephropathy: Secondary | ICD-10-CM | POA: Diagnosis not present

## 2018-08-25 NOTE — Patient Instructions (Signed)
[name] , Thank you for taking time to come for your Medicare Wellness Visit. I appreciate your ongoing commitment to your health goals. Please review the following plan we discussed and let me know if I can assist you in the future.   Screening recommendations/referrals: Colonoscopy: 02/2012 Recommended yearly ophthalmology/optometry visit for glaucoma screening and checkup Recommended yearly dental visit for hygiene and checkup  Vaccinations: Influenza vaccine: declines Pneumococcal vaccine: declines Tdap vaccine: declines Shingles vaccine: discussed    Advanced directives: copy in chart  Conditions/risks identified: obesity  Next appointment:   Preventive Care 40-64 Years, Male Preventive care refers to lifestyle choices and visits with your health care provider that can promote health and wellness. What does preventive care include?  A yearly physical exam. This is also called an annual well check.  Dental exams once or twice a year.  Routine eye exams. Ask your health care provider how often you should have your eyes checked.  Personal lifestyle choices, including:  Daily care of your teeth and gums.  Regular physical activity.  Eating a healthy diet.  Avoiding tobacco and drug use.  Limiting alcohol use.  Practicing safe sex.  Taking low-dose aspirin every day starting at age 76. What happens during an annual well check? The services and screenings done by your health care provider during your annual well check will depend on your age, overall health, lifestyle risk factors, and family history of disease. Counseling  Your health care provider may ask you questions about your:  Alcohol use.  Tobacco use.  Drug use.  Emotional well-being.  Home and relationship well-being.  Sexual activity.  Eating habits.  Work and work Statistician. Screening  You may have the following tests or measurements:  Height, weight, and BMI.  Blood pressure.   Lipid and cholesterol levels. These may be checked every 5 years, or more frequently if you are over 27 years old.  Skin check.  Lung cancer screening. You may have this screening every year starting at age 1 if you have a 30-pack-year history of smoking and currently smoke or have quit within the past 15 years.  Fecal occult blood test (FOBT) of the stool. You may have this test every year starting at age 59.  Flexible sigmoidoscopy or colonoscopy. You may have a sigmoidoscopy every 5 years or a colonoscopy every 10 years starting at age 33.  Prostate cancer screening. Recommendations will vary depending on your family history and other risks.  Hepatitis C blood test.  Hepatitis B blood test.  Sexually transmitted disease (STD) testing.  Diabetes screening. This is done by checking your blood sugar (glucose) after you have not eaten for a while (fasting). You may have this done every 1-3 years. Discuss your test results, treatment options, and if necessary, the need for more tests with your health care provider. Vaccines  Your health care provider may recommend certain vaccines, such as:  Influenza vaccine. This is recommended every year.  Tetanus, diphtheria, and acellular pertussis (Tdap, Td) vaccine. You may need a Td booster every 10 years.  Zoster vaccine. You may need this after age 84.  Pneumococcal 13-valent conjugate (PCV13) vaccine. You may need this if you have certain conditions and have not been vaccinated.  Pneumococcal polysaccharide (PPSV23) vaccine. You may need one or two doses if you smoke cigarettes or if you have certain conditions. Talk to your health care provider about which screenings and vaccines you need and how often you need them. This information is not  intended to replace advice given to you by your health care provider. Make sure you discuss any questions you have with your health care provider. Document Released: 03/31/2015 Document Revised:  11/22/2015 Document Reviewed: 01/03/2015 Elsevier Interactive Patient Education  2017 [place] Prevention in the Home Falls can cause injuries. They can happen to people of all ages. There are many things you can do to make your home safe and to help prevent falls. What can I do on the outside of my home?  Regularly fix the edges of walkways and driveways and fix any cracks.  Remove anything that might make you trip as you walk through a door, such as a raised step or threshold.  Trim any bushes or trees on the path to your home.  Use bright outdoor lighting.  Clear any walking paths of anything that might make someone trip, such as rocks or tools.  Regularly check to see if handrails are loose or broken. Make sure that both sides of any steps have handrails.  Any raised decks and porches should have guardrails on the edges.  Have any leaves, snow, or ice cleared regularly.  Use sand or salt on walking paths during winter.  Clean up any spills in your garage right away. This includes oil or grease spills. What can I do in the bathroom?  Use night lights.  Install grab bars by the toilet and in the tub and shower. Do not use towel bars as grab bars.  Use non-skid mats or decals in the tub or shower.  If you need to sit down in the shower, use a plastic, non-slip stool.  Keep the floor dry. Clean up any water that spills on the floor as soon as it happens.  Remove soap buildup in the tub or shower regularly.  Attach bath mats securely with double-sided non-slip rug tape.  Do not have throw rugs and other things on the floor that can make you trip. What can I do in the bedroom?  Use night lights.  Make sure that you have a light by your bed that is easy to reach.  Do not use any sheets or blankets that are too big for your bed. They should not hang down onto the floor.  Have a firm chair that has side arms. You can use this for support while you get  dressed.  Do not have throw rugs and other things on the floor that can make you trip. What can I do in the kitchen?  Clean up any spills right away.  Avoid walking on wet floors.  Keep items that you use a lot in easy-to-reach places.  If you need to reach something above you, use a strong step stool that has a grab bar.  Keep electrical cords out of the way.  Do not use floor polish or wax that makes floors slippery. If you must use wax, use non-skid floor wax.  Do not have throw rugs and other things on the floor that can make you trip. What can I do with my stairs?  Do not leave any items on the stairs.  Make sure that there are handrails on both sides of the stairs and use them. Fix handrails that are broken or loose. Make sure that handrails are as long as the stairways.  Check any carpeting to make sure that it is firmly attached to the stairs. Fix any carpet that is loose or worn.  Avoid having throw rugs at the  top or bottom of the stairs. If you do have throw rugs, attach them to the floor with carpet tape.  Make sure that you have a light switch at the top of the stairs and the bottom of the stairs. If you do not have them, ask someone to add them for you. What else can I do to help prevent falls?  Wear shoes that:  Do not have high heels.  Have rubber bottoms.  Are comfortable and fit you well.  Are closed at the toe. Do not wear sandals.  If you use a stepladder:  Make sure that it is fully opened. Do not climb a closed stepladder.  Make sure that both sides of the stepladder are locked into place.  Ask someone to hold it for you, if possible.  Clearly mark and make sure that you can see:  Any grab bars or handrails.  First and last steps.  Where the edge of each step is.  Use tools that help you move around (mobility aids) if they are needed. These include:  Canes.  Walkers.  Scooters.  Crutches.  Turn on the lights when you go into a  dark area. Replace any light bulbs as soon as they burn out.  Set up your furniture so you have a clear path. Avoid moving your furniture around.  If any of your floors are uneven, fix them.  If there are any pets around you, be aware of where they are.  Review your medicines with your doctor. Some medicines can make you feel dizzy. This can increase your chance of falling. Ask your doctor what other things that you can do to help prevent falls. This information is not intended to replace advice given to you by your health care provider. Make sure you discuss any questions you have with your health care provider. Document Released: 12/29/2008 Document Revised: 08/10/2015 Document Reviewed: 04/08/2014 Elsevier Interactive Patient Education  2017 [employer].

## 2018-08-25 NOTE — Telephone Encounter (Signed)
Patient was supposed to have a 3 month f/u for his diabetes but he is unable to come to the office due to transportation issues. I have scheduled pt for virtual so he can get a refill on Ambien and in the meantime pt is to find out if the [facility] bus is running so we can schedule him an appointment to come into the office. YRL,RMA

## 2018-08-25 NOTE — Progress Notes (Addendum)
Subjective:   [name] is a 63 y.o. male who presents for Medicare Annual/Subsequent preventive examination.  This visit type was conducted due to national recommendations for restrictions regarding the COVID-19 Pandemic (e.g. social distancing). This format is felt to be most appropriate for this patient at this time. All issues noted in this document were discussed and addressed. No physical exam was performed (except for noted visual exam findings with Video Visits). This patient, [name], has given permission to perform this visit via telephone. Vital signs may be absent or patient reported.  Patient location:  At home  Nurse location:  [place] office     Review of Systems:  n/a Cardiac Risk Factors include: advanced age (>14men, >2 women);male gender;hypertension;diabetes mellitus;obesity (BMI >30kg/m2)     Objective:    Vitals: Ht 5' 5.5" (1.664 m) Comment: per patient  Wt 194 lb (88 kg) Comment: per patient  BMI 31.79 kg/m   Body mass index is 31.79 kg/m.  Advanced Directives 08/25/2018 10/11/2017 09/30/2016 09/19/2016 09/06/2016 08/28/2016 08/21/2016  Does Patient Have a Medical Advance Directive? Yes No No No No Yes Yes  Type of Advance Directive Out of facility DNR (pink MOST or yellow form) - - Out of facility DNR (pink MOST or yellow form) - Out of facility DNR (pink MOST or yellow form) Out of facility DNR (pink MOST or yellow form)  Does patient want to make changes to medical advance directive? (No Data) - No - Patient declined No - Patient declined No - Patient declined No - Patient declined No - Patient declined  Would patient like information on creating a medical advance directive? - No - Patient declined - No - Patient declined No - Patient declined - -  Pre-existing out of facility DNR order (yellow form or pink MOST form) - - - Pink MOST form placed in chart (order not valid for inpatient use) - Pink MOST form placed in chart (order not valid for inpatient use)  Pink MOST form placed in chart (order not valid for inpatient use)    Tobacco Social History   Tobacco Use  Smoking Status Never Smoker  Smokeless Tobacco Never Used     Counseling given: Not Answered   Clinical Intake:  Pre-visit preparation completed: Yes  Pain : No/denies pain Pain Score: 0-No pain     Nutritional Status: BMI > 30  Obese Nutritional Risks: None Diabetes: Yes CBG done?: No Did pt. bring in CBG monitor from home?: No  How often do you need to have someone help you when you read instructions, pamphlets, or other written materials from your doctor or pharmacy?: 1 - Never What is the last grade level you completed in school?: 10th grade  Interpreter Needed?: No  Information entered by :: NAllen LPN  Past Medical History:  Diagnosis Date  . Depression   . GERD (gastroesophageal reflux disease)   . Hemorrhoids   . Hypertension   . Macular degeneration   . Microcytic anemia   . Prostate cancer ([place]) 08/2009  . Tubular adenoma    Past Surgical History:  Procedure Laterality Date  . CYSTOSCOPY WITH RETROGRADE PYELOGRAM, URETEROSCOPY AND STENT PLACEMENT Right 07/31/2016   Procedure: CYSTOSCOPY WITH RIGHT  RETROGRADE PYELOGRAM, URETEROSCOPY;  Surgeon: [name], MD;  Location: [facility];  Service: Urology;  Laterality: Right;  . INGUINAL HERNIA REPAIR    . Left ankle joint fusion  1981  . PROSTATE BIOPSY     x 2  .  URETERAL REIMPLANTION  07/31/2016   Procedure: URETERAL REIMPLANT, right boari flap, right psoas hitch;  Surgeon: [name], MD;  Location: [facility];  Service: Urology;;   Family History  Problem Relation Age of Onset  . Lung cancer Mother        Deceased  . Throat cancer Brother   . Pancreatic cancer Father   . Heart disease Father        Deceased  . Lung cancer Maternal Uncle        nephew  . Diabetes Other    Social History   Socioeconomic History  . Marital status: Divorced    Spouse name: Not on file  .  Number of children: 0  . Years of education: Not on file  . Highest education level: Not on file  Occupational History  . Occupation: retired  Scientific laboratory technician  . Financial resource strain: Not on file  . Food insecurity:    Worry: Never true    Inability: Never true  . Transportation needs:    Medical: Yes    Non-medical: Yes  Tobacco Use  . Smoking status: Never Smoker  . Smokeless tobacco: Never Used  Substance and Sexual Activity  . Alcohol use: Yes    Comment: beer 2-3 times a week  . Drug use: No  . Sexual activity: Yes  Lifestyle  . Physical activity:    Days per week: 7 days    Minutes per session: 10 min  . Stress: Not at all  Relationships  . Social connections:    Talks on phone: Not on file    Gets together: Not on file    Attends religious service: Not on file    Active member of club or organization: Not on file    Attends meetings of clubs or organizations: Not on file    Relationship status: Not on file  Other Topics Concern  . Not on file  Social History Narrative  . Not on file    Outpatient Encounter Medications as of 08/25/2018  Medication Sig  . acetaminophen (TYLENOL) 500 MG tablet Take 500 mg by mouth every 6 (six) hours as needed for mild pain or moderate pain.  Marland Kitchen aspirin EC 81 MG tablet Take 81 mg by mouth daily.  Marland Kitchen atorvastatin (LIPITOR) 20 MG tablet TAKE 1 TABLET BY ORAL ROUTE EVERY DAY ** ONLY 30 DAY SUPPLY UNTIL MAKES APPT  . diclofenac (VOLTAREN) 50 MG EC tablet Take 1 tablet (50 mg total) by mouth 2 (two) times daily.  . metoprolol tartrate (LOPRESSOR) 50 MG tablet TAKE 1 TABLET BY MOUTH EVERY DAY  . Multiple Vitamin (MULTIVITAMIN) tablet Take 1 tablet by mouth daily.  Marland Kitchen olmesartan (BENICAR) 40 MG tablet Take 1 tablet (40 mg total) by mouth daily.  . polyethylene glycol (MIRALAX / GLYCOLAX) packet Take 17 g by mouth daily. Stop taking if having more than 2 soft/loose bowel movements daily. (Patient taking differently: Take 17 g by mouth daily  as needed for moderate constipation. Stop taking if having more than 2 soft/loose bowel movements daily.)  . TRADJENTA 5 MG TABS tablet TAKE 1 TABLET BY MOUTH EVERY DAY  . zolpidem (AMBIEN) 5 MG tablet Take 5 mg by mouth at bedtime.  . mirabegron ER (MYRBETRIQ) 50 MG TB24 tablet Take 1 tablet (50 mg total) by mouth daily. (Patient not taking: Reported on 03/26/2018)  . olmesartan-hydrochlorothiazide (BENICAR HCT) 40-25 MG tablet TAKE 1 TABLET BY MOUTH EVERY DAY **ONLY 30 DAY SUPPLY UNTIL APPT (  Patient not taking: Reported on 08/25/2018)   No facility-administered encounter medications on file as of 08/25/2018.     Activities of Daily Living In your present state of health, do you have any difficulty performing the following activities: 08/25/2018  Hearing? N  Vision? Y  Comment things look blurry  Difficulty concentrating or making decisions? N  Walking or climbing stairs? Y  Comment has aide that helps him climb stairs  Dressing or bathing? N  Doing errands, shopping? Y  Comment issues due Copywriter, advertising and eating ? Y  Comment has an aide that prepares  Using the Toilet? N  In the past six months, have you accidently leaked urine? Y  Do you have problems with loss of bowel control? N  Managing your Medications? N  Managing your Finances? N  Housekeeping or managing your Housekeeping? N  Some recent data might be hidden    Patient Care Team: [name], [name] as PCP - General (General Practice)   Assessment:   This is a routine wellness examination for [name].  Exercise Activities and Dietary recommendations Current Exercise Habits: Home exercise routine, Type of exercise: walking, Time (Minutes): 10, Frequency (Times/Week): 7, Weekly Exercise (Minutes/Week): 70  Goals    . Patient Stated     Needs to see urologist       Fall Risk Fall Risk  08/25/2018 03/26/2018 09/19/2016  Falls in the past year? 0 0 Yes  Number falls in past yr: - - 2 or more  Injury with Fall?  - - Yes  Risk Factor Category  - - High Fall Risk  Risk for fall due to : Impaired vision;Medication side effect - History of fall(s);Impaired balance/gait;Impaired mobility  Follow up Education provided;Falls prevention discussed - Education provided;Falls prevention discussed   Is the patient's home free of loose throw rugs in walkways, pet beds, electrical cords, etc?   yes      Grab bars in the bathroom? yes      Handrails on the stairs?   yes      Adequate lighting?   yes  Timed Get Up and Go Performed: n/a  Depression Screen PHQ 2/9 Scores 08/25/2018 03/26/2018 09/19/2016  PHQ - 2 Score 0 0 0  PHQ- 9 Score 0 - -    Cognitive Function     6CIT Screen 08/25/2018  What Year? 0 points  What month? 0 points  What time? 0 points  Count back from 20 0 points  Months in reverse 0 points  Repeat phrase 0 points  Total Score 0    Immunization History  Administered Date(s) Administered  . Influenza Split 12/04/2009  . PPD Test 05/29/2016    Qualifies for Shingles Vaccine? yes  Screening Tests Health Maintenance  Topic Date Due  . Hepatitis C Screening  1955-10-10  . FOOT EXAM  01/21/1964  . OPHTHALMOLOGY EXAM  01/21/1964  . COLONOSCOPY  02/19/2017  . TETANUS/TDAP  08/25/2019 (Originally 01/20/1973)  . HEMOGLOBIN A1C  09/24/2018  . INFLUENZA VACCINE  10/17/2018  . HIV Screening  Completed   Cancer Screenings: Lung: Low Dose CT Chest recommended if Age 27-80 years, 30 pack-year currently smoking OR have quit w/in 15years. Patient does not qualify. Colorectal: up to date  Additional Screenings: Hepatitis C Screening:due      Plan:   Needs to see urologist. Doesn't get vaccines.  I have personally reviewed and noted the following in the patient's chart:   . Medical and social history . Use  of alcohol, tobacco or illicit drugs  . Current medications and supplements . Functional ability and status . Nutritional status . Physical activity . Advanced directives . List  of other physicians . Hospitalizations, surgeries, and ER visits in previous 12 months . Vitals . Screenings to include cognitive, depression, and falls . Referrals and appointments  In addition, I have reviewed and discussed with patient certain preventive protocols, quality metrics, and best practice recommendations. A written personalized care plan for preventive services as well as general preventive health recommendations were provided to patient.     Kellie Simmering, LPN  09/18/4035

## 2018-08-26 ENCOUNTER — Encounter: Payer: Self-pay | Admitting: Nurse Practitioner

## 2018-08-26 ENCOUNTER — Other Ambulatory Visit: Payer: Self-pay

## 2018-08-26 ENCOUNTER — Telehealth: Payer: Self-pay

## 2018-08-26 ENCOUNTER — Ambulatory Visit (INDEPENDENT_AMBULATORY_CARE_PROVIDER_SITE_OTHER): Payer: Medicare Other | Admitting: Nurse Practitioner

## 2018-08-26 VITALS — Wt 194.0 lb

## 2018-08-26 DIAGNOSIS — I10 Essential (primary) hypertension: Secondary | ICD-10-CM | POA: Diagnosis not present

## 2018-08-26 DIAGNOSIS — E1121 Type 2 diabetes mellitus with diabetic nephropathy: Secondary | ICD-10-CM

## 2018-08-26 DIAGNOSIS — R3915 Urgency of urination: Secondary | ICD-10-CM | POA: Diagnosis not present

## 2018-08-26 DIAGNOSIS — G4700 Insomnia, unspecified: Secondary | ICD-10-CM

## 2018-08-26 MED ORDER — TRAZODONE HCL 50 MG PO TABS: 50.0000 mg | ORAL_TABLET | Freq: Every day | ORAL | 2 refills | Status: DC

## 2018-08-26 NOTE — Progress Notes (Signed)
Virtual Visit via Telephone   This visit type was conducted due to national recommendations for restrictions regarding the COVID-19 Pandemic (e.g. social distancing) in an effort to limit this patient's exposure and mitigate transmission in our community.  Patients identity confirmed using two different identifiers.  This format is felt to be most appropriate for this patient at this time.  All issues noted in this document were discussed and addressed.  No physical exam was performed (except for noted visual exam findings with Video Visits).    Date:  09/06/2018   ID:  [name], DOB September 07, 1955, MRN [id number]  Patient Location:  Home - spoke with [name]  Provider location:   Office    Chief Complaint:  Diabetes follow up  History of Present Illness:    [name] is a 63 y.o. male who presents via video conferencing for a telehealth visit today.    The patient does not have symptoms concerning for COVID-19 infection (fever, chills, cough, or new shortness of breath).   Insomnia   Diabetes  He presents for his follow-up diabetic visit. He has type 2 diabetes mellitus. His disease course has been worsening. There are no hypoglycemic associated symptoms. Pertinent negatives for hypoglycemia include no dizziness. There are no diabetic associated symptoms. There are no diabetic complications. Risk factors for coronary artery disease include obesity, sedentary lifestyle and male sex. Current diabetic treatment includes oral agent (monotherapy). He is compliant with treatment most of the time. When asked about meal planning, he reported none. He has not had a previous visit with a dietitian. He rarely participates in exercise. (Blood sugars have been in the 150-190 ) He does not see a podiatrist. Urinary Frequency   This is a chronic problem. The current episode started more than 1 year ago. He is not sexually active. There is no history of pyelonephritis. Associated symptoms  include frequency and urgency. Pertinent negatives include no nausea. He has tried nothing for the symptoms. There is no history of catheterization or recurrent UTIs.     Past Medical History:  Diagnosis Date   Depression    GERD (gastroesophageal reflux disease)    Hemorrhoids    Hypertension    Macular degeneration    Microcytic anemia    Prostate cancer ([place]) 08/2009   Tubular adenoma    Past Surgical History:  Procedure Laterality Date   CYSTOSCOPY WITH RETROGRADE PYELOGRAM, URETEROSCOPY AND STENT PLACEMENT Right 07/31/2016   Procedure: CYSTOSCOPY WITH RIGHT  RETROGRADE PYELOGRAM, URETEROSCOPY;  Surgeon: [name], MD;  Location: [facility];  Service: Urology;  Laterality: Right;   INGUINAL HERNIA REPAIR     Left ankle joint fusion  1981   PROSTATE BIOPSY     x 2   URETERAL REIMPLANTION  07/31/2016   Procedure: URETERAL REIMPLANT, right boari flap, right psoas hitch;  Surgeon: [name], MD;  Location: [facility];  Service: Urology;;     Current Meds  Medication Sig   acetaminophen (TYLENOL) 500 MG tablet Take 500 mg by mouth every 6 (six) hours as needed for mild pain or moderate pain.   aspirin EC 81 MG tablet Take 81 mg by mouth daily.   atorvastatin (LIPITOR) 20 MG tablet TAKE 1 TABLET BY ORAL ROUTE EVERY DAY ** ONLY 30 DAY SUPPLY UNTIL MAKES APPT   diclofenac (VOLTAREN) 50 MG EC tablet Take 1 tablet (50 mg total) by mouth 2 (two) times daily.   metoprolol tartrate (LOPRESSOR) 50 MG tablet TAKE  1 TABLET BY MOUTH EVERY DAY   Multiple Vitamin (MULTIVITAMIN) tablet Take 1 tablet by mouth daily.   olmesartan-hydrochlorothiazide (BENICAR HCT) 40-25 MG tablet TAKE 1 TABLET BY MOUTH EVERY DAY **ONLY 30 DAY SUPPLY UNTIL APPT   polyethylene glycol (MIRALAX / GLYCOLAX) packet Take 17 g by mouth daily. Stop taking if having more than 2 soft/loose bowel movements daily. (Patient taking differently: Take 17 g by mouth daily as needed for moderate  constipation. Stop taking if having more than 2 soft/loose bowel movements daily.)   TRADJENTA 5 MG TABS tablet TAKE 1 TABLET BY MOUTH EVERY DAY   zolpidem (AMBIEN) 5 MG tablet Take 5 mg by mouth at bedtime.     Allergies:   Patient has no known allergies.   Social History   Tobacco Use   Smoking status: Never Smoker   Smokeless tobacco: Never Used  Substance Use Topics   Alcohol use: Yes    Comment: beer 2-3 times a week   Drug use: No     Family Hx: The patient's family history includes Diabetes in an other family member; Heart disease in his father; Lung cancer in his maternal uncle and mother; Pancreatic cancer in his father; Throat cancer in his brother.  ROS:   Please see the history of present illness.    Review of Systems  Constitutional: Negative.   Respiratory: Negative.  Negative for cough.   Cardiovascular: Negative.   Gastrointestinal: Negative for nausea.  Genitourinary: Positive for frequency and urgency.  Neurological: Negative for dizziness and tingling.  Psychiatric/Behavioral: The patient has insomnia.     All other systems reviewed and are negative.   Labs/Other Tests and Data Reviewed:    Recent Labs: 09/03/2018: ALT 27; BUN 54; Creatinine, Ser 2.23; Potassium 5.3; Sodium 130   Recent Lipid Panel Lab Results  Component Value Date/Time   CHOL 150 03/26/2018 09:53 AM   TRIG 172 (H) 03/26/2018 09:53 AM   HDL 35 (L) 03/26/2018 09:53 AM   CHOLHDL 4.3 03/26/2018 09:53 AM   CHOLHDL 6.2 04/19/2008 04:30 AM   LDLCALC 81 03/26/2018 09:53 AM    Wt Readings from Last 3 Encounters:  09/03/18 199 lb 12.8 oz (90.6 kg)  08/26/18 194 lb (88 kg)  08/25/18 194 lb (88 kg)     Exam:    Vital Signs:  Wt 194 lb (88 kg)    BMI 31.79 kg/m     Physical Exam  Constitutional: He is oriented to person, place, and time. No distress.  Neurological: He is alert and oriented to person, place, and time.  Psychiatric: Mood, memory, affect and judgment normal.      ASSESSMENT & PLAN:    1. Essential hypertension  No blood pressure this visit.  CMP ordered to check renal function.   The importance of regular exercise and dietary modification was stressed to the patient.  - CMP14 + Anion Gap; Future  2. Type 2 diabetes mellitus with diabetic nephropathy, without long-term current use of insulin (HCC)  Chronic, poorly controlled  Continue with current medications  Encouraged to limit intake of sugary foods and drinks - Hemoglobin A1c; Future - CMP14 + Anion Gap; Future  3. Insomnia, unspecified type  Chronic,   Will try him on trazodone instead of Ambien - traZODone (DESYREL) 50 MG tablet; Take 1 tablet (50 mg total) by mouth at bedtime.  Dispense: 30 tablet; Refill: 2  4. Urinary urgency  Will have him to get a urine sample when he comes to  the office  He is being followed by Urology     COVID-19 Education: The signs and symptoms of COVID-19 were discussed with the patient and how to seek care for testing (follow up with PCP or arrange E-visit).  The importance of social distancing was discussed today.  Patient Risk:   After full review of this patients clinical status, I feel that they are at least moderate risk at this time.  Time:   Today, I have spent 12.36 minutes/ seconds with the patient with telehealth technology discussing above diagnoses.     Medication Adjustments/Labs and Tests Ordered: Current medicines are reviewed at length with the patient today.  Concerns regarding medicines are outlined above.   Tests Ordered: Orders Placed This Encounter  Procedures   Hemoglobin A1c   CMP14 + Anion Gap    Medication Changes: Meds ordered this encounter  Medications   traZODone (DESYREL) 50 MG tablet    Sig: Take 1 tablet (50 mg total) by mouth at bedtime.    Dispense:  30 tablet    Refill:  2    Disposition:  Follow up in 3 month(s)  Signed, [name], [name]

## 2018-08-26 NOTE — Telephone Encounter (Signed)
08/26/2018 Spoke with Ivonne Andrew, a representative at [facility]. She will send an email today to have the patient's assessment updated in the next 24 hours. Ivonne Andrew said to have the patient call within the next day or so to schedule transportation. Spoke with patient and relayed information. MA

## 2018-08-28 ENCOUNTER — Telehealth: Payer: Self-pay

## 2018-08-28 NOTE — Telephone Encounter (Signed)
08/28/2018 Called [name] to see if he was able to contact the [facility]. Left message on patient's voicemail. MA

## 2018-09-02 ENCOUNTER — Telehealth: Payer: Self-pay

## 2018-09-02 NOTE — Telephone Encounter (Signed)
09/01/2018 Spoke with [name] his assessment has been updated in the system and he understands he will need to call 3 days in advance to schedule transportation to his appointments .MA

## 2018-09-03 ENCOUNTER — Encounter: Payer: Self-pay | Admitting: Nurse Practitioner

## 2018-09-03 ENCOUNTER — Ambulatory Visit (INDEPENDENT_AMBULATORY_CARE_PROVIDER_SITE_OTHER): Payer: Medicare Other | Admitting: Nurse Practitioner

## 2018-09-03 ENCOUNTER — Other Ambulatory Visit: Payer: Medicare Other | Admitting: Nurse Practitioner

## 2018-09-03 ENCOUNTER — Other Ambulatory Visit: Payer: Self-pay

## 2018-09-03 VITALS — BP 122/80 | HR 71 | Temp 97.6°F | Ht 64.2 in | Wt 199.8 lb

## 2018-09-03 DIAGNOSIS — R32 Unspecified urinary incontinence: Secondary | ICD-10-CM

## 2018-09-03 DIAGNOSIS — R300 Dysuria: Secondary | ICD-10-CM | POA: Diagnosis not present

## 2018-09-03 DIAGNOSIS — E1121 Type 2 diabetes mellitus with diabetic nephropathy: Secondary | ICD-10-CM | POA: Diagnosis not present

## 2018-09-03 DIAGNOSIS — R350 Frequency of micturition: Secondary | ICD-10-CM

## 2018-09-03 DIAGNOSIS — I10 Essential (primary) hypertension: Secondary | ICD-10-CM | POA: Diagnosis not present

## 2018-09-03 LAB — POCT URINALYSIS DIPSTICK
Bilirubin, UA: NEGATIVE
Glucose, UA: POSITIVE — AB
Ketones, UA: NEGATIVE
Nitrite, UA: NEGATIVE
Protein, UA: POSITIVE — AB
Spec Grav, UA: 1.015 (ref 1.010–1.025)
Urobilinogen, UA: 0.2 E.U./dL
pH, UA: 5.5 (ref 5.0–8.0)

## 2018-09-03 MED ORDER — CIPROFLOXACIN HCL 500 MG PO TABS: 500.0000 mg | ORAL_TABLET | Freq: Two times a day (BID) | ORAL | 0 refills | Status: AC

## 2018-09-03 MED ORDER — MIRABEGRON ER 25 MG PO TB24: 25.0000 mg | ORAL_TABLET | Freq: Every day | ORAL | 2 refills | Status: DC

## 2018-09-03 NOTE — Progress Notes (Signed)
Subjective:     Patient ID: [name] , male    DOB: 11-10-1955 , 63 y.o.   MRN: [id number]   Chief Complaint  Patient presents with  . Dysuria    patient states he has had the urge and freuqency to urinate and when he does it burns a lot. it has been going on for the past month    HPI  Dysuria  This is a recurrent problem. The current episode started in the past 7 days. The problem occurs every urination. The quality of the pain is described as burning. There has been no fever. He is not sexually active. There is no history of pyelonephritis. Associated symptoms include frequency, hematuria and hesitancy. Pertinent negatives include no chills or discharge. He has tried nothing for the symptoms. His past medical history is significant for recurrent UTIs. There is no history of catheterization.     Past Medical History:  Diagnosis Date  . Depression   . GERD (gastroesophageal reflux disease)   . Hemorrhoids   . Hypertension   . Macular degeneration   . Microcytic anemia   . Prostate cancer ([place]) 08/2009  . Tubular adenoma      Family History  Problem Relation Age of Onset  . Lung cancer Mother        Deceased  . Throat cancer Brother   . Pancreatic cancer Father   . Heart disease Father        Deceased  . Lung cancer Maternal Uncle        nephew  . Diabetes Other      Current Outpatient Medications:  .  acetaminophen (TYLENOL) 500 MG tablet, Take 500 mg by mouth every 6 (six) hours as needed for mild pain or moderate pain., Disp: , Rfl:  .  aspirin EC 81 MG tablet, Take 81 mg by mouth daily., Disp: , Rfl:  .  atorvastatin (LIPITOR) 20 MG tablet, TAKE 1 TABLET BY ORAL ROUTE EVERY DAY ** ONLY 30 DAY SUPPLY UNTIL MAKES APPT, Disp: 90 tablet, Rfl: 1 .  diclofenac (VOLTAREN) 50 MG EC tablet, Take 1 tablet (50 mg total) by mouth 2 (two) times daily., Disp: 15 tablet, Rfl: 0 .  metoprolol tartrate (LOPRESSOR) 50 MG tablet, TAKE 1 TABLET BY MOUTH EVERY DAY, Disp: 90 tablet,  Rfl: 1 .  mirabegron ER (MYRBETRIQ) 50 MG TB24 tablet, Take 1 tablet (50 mg total) by mouth daily. (Patient not taking: Reported on 03/26/2018), Disp: 30 tablet, Rfl: 0 .  Multiple Vitamin (MULTIVITAMIN) tablet, Take 1 tablet by mouth daily., Disp: , Rfl:  .  olmesartan-hydrochlorothiazide (BENICAR HCT) 40-25 MG tablet, TAKE 1 TABLET BY MOUTH EVERY DAY **ONLY 30 DAY SUPPLY UNTIL APPT, Disp: 90 tablet, Rfl: 1 .  polyethylene glycol (MIRALAX / GLYCOLAX) packet, Take 17 g by mouth daily. Stop taking if having more than 2 soft/loose bowel movements daily. (Patient taking differently: Take 17 g by mouth daily as needed for moderate constipation. Stop taking if having more than 2 soft/loose bowel movements daily.), Disp: 30 packet, Rfl: PRN .  TRADJENTA 5 MG TABS tablet, TAKE 1 TABLET BY MOUTH EVERY DAY, Disp: 90 tablet, Rfl: 0 .  traZODone (DESYREL) 50 MG tablet, Take 1 tablet (50 mg total) by mouth at bedtime., Disp: 30 tablet, Rfl: 2 .  zolpidem (AMBIEN) 5 MG tablet, Take 5 mg by mouth at bedtime., Disp: , Rfl:    No Known Allergies   Review of Systems  Constitutional: Negative for chills.  Eyes: Negative.   Respiratory: Negative.   Cardiovascular: Negative.  Negative for chest pain, palpitations and leg swelling.  Endocrine: Negative for polydipsia, polyphagia and polyuria.  Genitourinary: Positive for dysuria, frequency, hematuria and hesitancy.  Neurological: Negative for dizziness and headaches.     Today's Vitals   09/03/18 0853  BP: 122/80  Pulse: 71  Temp: 97.6 F (36.4 C)  TempSrc: Oral  Weight: 199 lb 12.8 oz (90.6 kg)  Height: 5' 4.2" (1.631 m)  PainSc: 0-No pain   Body mass index is 34.08 kg/m.   Objective:  Physical Exam Constitutional:      Appearance: Normal appearance.  Cardiovascular:     Rate and Rhythm: Normal rate and regular rhythm.  Pulmonary:     Effort: Pulmonary effort is normal.     Breath sounds: Normal breath sounds.  Genitourinary:    Comments: His  pants are saturated with urine Skin:    General: Skin is warm and dry.     Capillary Refill: Capillary refill takes less than 2 seconds.  Neurological:     General: No focal deficit present.     Mental Status: He is alert.  Psychiatric:        Mood and Affect: Mood normal.        Behavior: Behavior normal.        Thought Content: Thought content normal.        Judgment: Judgment normal.         Assessment And Plan:     1. Dysuria  Positive blood in urine and small white cells, will treat empirically for UTI while pending urine culture - POCT Urinalysis Dipstick (81002) - ciprofloxacin (CIPRO) 500 MG tablet; Take 1 tablet (500 mg total) by mouth 2 (two) times daily for 10 days.  Dispense: 10 tablet; Refill: 0  2. Urinary frequency   will be treating for possible UTI - Culture, Urine - mirabegron ER (MYRBETRIQ) 25 MG TB24 tablet; Take 1 tablet (25 mg total) by mouth daily.  Dispense: 30 tablet; Refill: 2  3. Urinary incontinence, unspecified type  He had been on myrbetriq in the past and stopped taking due to an improvement  He is scheduled to see the urologist in 6 weeks, will restart his myrbetriq and advised him to not discontinue unless a provider advises him to do so.    One sample given to him in the office as well.  - mirabegron ER (MYRBETRIQ) 25 MG TB24 tablet; Take 1 tablet (25 mg total) by mouth daily.  Dispense: 30 tablet; Refill: 2   [name], [name]    THE PATIENT IS ENCOURAGED TO PRACTICE SOCIAL DISTANCING DUE TO THE COVID-19 PANDEMIC.

## 2018-09-04 LAB — CMP14 + ANION GAP
ALT: 27 IU/L (ref 0–44)
AST: 16 IU/L (ref 0–40)
Albumin/Globulin Ratio: 1.4 (ref 1.2–2.2)
Albumin: 4.3 g/dL (ref 3.8–4.8)
Alkaline Phosphatase: 178 IU/L — ABNORMAL HIGH (ref 39–117)
Anion Gap: 16 mmol/L (ref 10.0–18.0)
BUN/Creatinine Ratio: 24 (ref 10–24)
BUN: 54 mg/dL — ABNORMAL HIGH (ref 8–27)
Bilirubin Total: 0.3 mg/dL (ref 0.0–1.2)
CO2: 18 mmol/L — ABNORMAL LOW (ref 20–29)
Calcium: 9.8 mg/dL (ref 8.6–10.2)
Chloride: 96 mmol/L (ref 96–106)
Creatinine, Ser: 2.23 mg/dL — ABNORMAL HIGH (ref 0.76–1.27)
GFR calc Af Amer: 35 mL/min/{1.73_m2} — ABNORMAL LOW (ref >59)
GFR calc non Af Amer: 30 mL/min/{1.73_m2} — ABNORMAL LOW (ref >59)
Globulin, Total: 3 g/dL (ref 1.5–4.5)
Glucose: 421 mg/dL — ABNORMAL HIGH (ref 65–99)
Potassium: 5.3 mmol/L — ABNORMAL HIGH (ref 3.5–5.2)
Sodium: 130 mmol/L — ABNORMAL LOW (ref 134–144)
Total Protein: 7.3 g/dL (ref 6.0–8.5)

## 2018-09-04 LAB — HEMOGLOBIN A1C
Est. average glucose Bld gHb Est-mCnc: 243 mg/dL
Hgb A1c MFr Bld: 10.1 % — ABNORMAL HIGH (ref 4.8–5.6)

## 2018-09-21 ENCOUNTER — Other Ambulatory Visit: Payer: Self-pay | Admitting: Nurse Practitioner

## 2018-09-21 ENCOUNTER — Telehealth: Payer: Self-pay

## 2018-09-21 DIAGNOSIS — N183 Chronic kidney disease, stage 3 unspecified: Secondary | ICD-10-CM

## 2018-09-21 NOTE — Progress Notes (Signed)
He needs to allow some time for the myrbetriq and I am referring him to a kidney specialist.

## 2018-09-21 NOTE — Telephone Encounter (Signed)
Patient called stating he is still having problems with his bladder and he would like some medicine.  RETURNED PT CALL AND LEFT HIM A V/M TO CALL OFFICE I WANTED TO NOTIFY HIM OF HIS LABS AND ALSO SEE IF HE SCHEDULED AN APPOINTMENT WITH HIS UROLOGIST. Lonia Mad

## 2018-09-24 DIAGNOSIS — L603 Nail dystrophy: Secondary | ICD-10-CM | POA: Diagnosis not present

## 2018-09-24 DIAGNOSIS — L84 Corns and callosities: Secondary | ICD-10-CM | POA: Diagnosis not present

## 2018-09-24 DIAGNOSIS — I739 Peripheral vascular disease, unspecified: Secondary | ICD-10-CM | POA: Diagnosis not present

## 2018-09-24 DIAGNOSIS — E1151 Type 2 diabetes mellitus with diabetic peripheral angiopathy without gangrene: Secondary | ICD-10-CM | POA: Diagnosis not present

## 2018-09-25 ENCOUNTER — Other Ambulatory Visit: Payer: Self-pay

## 2018-09-25 DIAGNOSIS — R350 Frequency of micturition: Secondary | ICD-10-CM

## 2018-09-25 DIAGNOSIS — R32 Unspecified urinary incontinence: Secondary | ICD-10-CM

## 2018-09-25 MED ORDER — MIRABEGRON ER 25 MG PO TB24: 25.0000 mg | ORAL_TABLET | Freq: Every day | ORAL | 1 refills | Status: DC

## 2018-10-02 DIAGNOSIS — N138 Other obstructive and reflux uropathy: Secondary | ICD-10-CM | POA: Diagnosis not present

## 2018-10-02 DIAGNOSIS — N401 Enlarged prostate with lower urinary tract symptoms: Secondary | ICD-10-CM | POA: Diagnosis not present

## 2018-10-02 DIAGNOSIS — C61 Malignant neoplasm of prostate: Secondary | ICD-10-CM | POA: Diagnosis not present

## 2018-10-02 DIAGNOSIS — N529 Male erectile dysfunction, unspecified: Secondary | ICD-10-CM | POA: Diagnosis not present

## 2018-10-02 DIAGNOSIS — N3941 Urge incontinence: Secondary | ICD-10-CM | POA: Diagnosis not present

## 2018-10-02 DIAGNOSIS — R8290 Unspecified abnormal findings in urine: Secondary | ICD-10-CM | POA: Diagnosis not present

## 2018-10-05 ENCOUNTER — Ambulatory Visit: Payer: Medicare Other | Admitting: Nurse Practitioner

## 2018-10-07 ENCOUNTER — Other Ambulatory Visit: Payer: Self-pay | Admitting: Nurse Practitioner

## 2018-10-09 ENCOUNTER — Telehealth: Payer: Self-pay

## 2018-10-09 NOTE — Telephone Encounter (Signed)
I called pt to notify him Dr.Sanford his Kidney Doctor wants him to stop taking diclofenac. Pt was out at the moment I advised him to give me a call back so I can tell him exactly which medication he needs to stop taking because he was unsure. YRL,RMA

## 2018-10-10 ENCOUNTER — Other Ambulatory Visit: Payer: Self-pay | Admitting: Nurse Practitioner

## 2018-10-12 ENCOUNTER — Encounter: Payer: Self-pay | Admitting: Nurse Practitioner

## 2018-10-12 ENCOUNTER — Ambulatory Visit (INDEPENDENT_AMBULATORY_CARE_PROVIDER_SITE_OTHER): Payer: Medicare Other | Admitting: Nurse Practitioner

## 2018-10-12 ENCOUNTER — Other Ambulatory Visit: Payer: Self-pay

## 2018-10-12 VITALS — BP 124/80 | HR 78 | Temp 98.2°F | Ht 64.2 in | Wt 195.2 lb

## 2018-10-12 DIAGNOSIS — R32 Unspecified urinary incontinence: Secondary | ICD-10-CM

## 2018-10-12 NOTE — Progress Notes (Signed)
Subjective:     Patient ID: [name] , male    DOB: [birth year] , 63 y.o.   MRN: [id number]   Chief Complaint  Patient presents with  . MED CHECK    HPI  Here to follow up with the myrbetriq - feels like doing better than before.  Urologist also put him on rapoflo. He is to follow up with urology in 1 month.    He has an appt with nephrologist on Friday.      Past Medical History:  Diagnosis Date  . Depression   . GERD (gastroesophageal reflux disease)   . Hemorrhoids   . Hypertension   . Macular degeneration   . Microcytic anemia   . Prostate cancer ([place]) 08/2009  . Tubular adenoma      Family History  Problem Relation Age of Onset  . Lung cancer Mother        Deceased  . Throat cancer Brother   . Pancreatic cancer Father   . Heart disease Father        Deceased  . Lung cancer Maternal Uncle        nephew  . Diabetes Other      Current Outpatient Medications:  .  acetaminophen (TYLENOL) 500 MG tablet, Take 500 mg by mouth every 6 (six) hours as needed for mild pain or moderate pain., Disp: , Rfl:  .  aspirin EC 81 MG tablet, Take 81 mg by mouth daily., Disp: , Rfl:  .  atorvastatin (LIPITOR) 20 MG tablet, TAKE 1 TABLET BY MOUTH EVERY DAY, Disp: 90 tablet, Rfl: 1 .  metoprolol tartrate (LOPRESSOR) 50 MG tablet, TAKE 1 TABLET BY MOUTH EVERY DAY, Disp: 90 tablet, Rfl: 1 .  mirabegron ER (MYRBETRIQ) 25 MG TB24 tablet, Take 1 tablet (25 mg total) by mouth daily., Disp: 30 tablet, Rfl: 1 .  Multiple Vitamin (MULTIVITAMIN) tablet, Take 1 tablet by mouth daily., Disp: , Rfl:  .  olmesartan-hydrochlorothiazide (BENICAR HCT) 40-25 MG tablet, TAKE 1 TABLET BY MOUTH EVERY DAY **ONLY 30 DAY SUPPLY UNTIL APPT, Disp: 90 tablet, Rfl: 1 .  TRADJENTA 5 MG TABS tablet, TAKE 1 TABLET BY MOUTH EVERY DAY, Disp: 90 tablet, Rfl: 0 .  traZODone (DESYREL) 50 MG tablet, Take 1 tablet (50 mg total) by mouth at bedtime., Disp: 30 tablet, Rfl: 2 .  polyethylene glycol (MIRALAX / GLYCOLAX)  packet, Take 17 g by mouth daily. Stop taking if having more than 2 soft/loose bowel movements daily. (Patient not taking: Reported on 10/12/2018), Disp: 30 packet, Rfl: PRN   No Known Allergies   Review of Systems  Constitutional: Negative.   Respiratory: Negative.   Cardiovascular: Negative.   Skin: Negative.   Neurological: Negative for dizziness and headaches.     Today's Vitals   10/12/18 1145  BP: 124/80  Pulse: 78  Temp: 98.2 F (36.8 C)  TempSrc: Oral  Weight: 195 lb 3.2 oz (88.5 kg)  Height: 5' 4.2" (1.631 m)  PainSc: 0-No pain   Body mass index is 33.3 kg/m.   Objective:  Physical Exam Vitals signs reviewed.  Constitutional:      Appearance: Normal appearance.  Cardiovascular:     Rate and Rhythm: Normal rate and regular rhythm.     Pulses: Normal pulses.     Heart sounds: Normal heart sounds. No murmur.  Pulmonary:     Effort: Pulmonary effort is normal.     Breath sounds: Normal breath sounds.  Skin:    General: Skin  is warm and dry.     Capillary Refill: Capillary refill takes less than 2 seconds.  Neurological:     General: No focal deficit present.     Mental Status: He is alert and oriented to person, place, and time.  Psychiatric:        Mood and Affect: Mood normal.        Behavior: Behavior normal.        Thought Content: Thought content normal.        Judgment: Judgment normal.         Assessment And Plan:     1. Urinary incontinence, unspecified type  Doing well on myrbetriq, the urologist also added rapaflo  He has a follow up in 1 month with urology and will repeat PSA at that time.   [name], [name]    THE PATIENT IS ENCOURAGED TO PRACTICE SOCIAL DISTANCING DUE TO THE COVID-19 PANDEMIC.

## 2018-10-12 NOTE — Patient Instructions (Signed)
Drink at least 6 - 16 oz bottles of water a day, if out in sun need to drink more

## 2018-10-16 DIAGNOSIS — N390 Urinary tract infection, site not specified: Secondary | ICD-10-CM | POA: Diagnosis not present

## 2018-10-16 DIAGNOSIS — I129 Hypertensive chronic kidney disease with stage 1 through stage 4 chronic kidney disease, or unspecified chronic kidney disease: Secondary | ICD-10-CM | POA: Diagnosis not present

## 2018-10-16 DIAGNOSIS — Z87442 Personal history of urinary calculi: Secondary | ICD-10-CM | POA: Diagnosis not present

## 2018-10-16 DIAGNOSIS — N189 Chronic kidney disease, unspecified: Secondary | ICD-10-CM | POA: Diagnosis not present

## 2018-10-16 DIAGNOSIS — D631 Anemia in chronic kidney disease: Secondary | ICD-10-CM | POA: Diagnosis not present

## 2018-10-16 DIAGNOSIS — N183 Chronic kidney disease, stage 3 (moderate): Secondary | ICD-10-CM | POA: Diagnosis not present

## 2018-10-30 ENCOUNTER — Other Ambulatory Visit: Payer: Self-pay | Admitting: Nephrology

## 2018-10-30 DIAGNOSIS — N183 Chronic kidney disease, stage 3 unspecified: Secondary | ICD-10-CM

## 2018-11-17 ENCOUNTER — Other Ambulatory Visit: Payer: Self-pay | Admitting: Nurse Practitioner

## 2018-11-17 DIAGNOSIS — R350 Frequency of micturition: Secondary | ICD-10-CM

## 2018-11-17 DIAGNOSIS — R32 Unspecified urinary incontinence: Secondary | ICD-10-CM

## 2018-11-27 ENCOUNTER — Other Ambulatory Visit: Payer: Self-pay

## 2018-11-27 ENCOUNTER — Encounter (HOSPITAL_COMMUNITY): Payer: Self-pay

## 2018-11-27 ENCOUNTER — Emergency Department (HOSPITAL_COMMUNITY): Payer: Medicare Other

## 2018-11-27 ENCOUNTER — Emergency Department (HOSPITAL_COMMUNITY)
Admission: EM | Admit: 2018-11-27 | Discharge: 2018-11-27 | Disposition: A | Discharge status: Home / Self Care | Payer: Medicare Other | Attending: Emergency Medicine | Admitting: Emergency Medicine

## 2018-11-27 DIAGNOSIS — Z7982 Long term (current) use of aspirin: Secondary | ICD-10-CM | POA: Insufficient documentation

## 2018-11-27 DIAGNOSIS — Z8546 Personal history of malignant neoplasm of prostate: Secondary | ICD-10-CM | POA: Insufficient documentation

## 2018-11-27 DIAGNOSIS — I129 Hypertensive chronic kidney disease with stage 1 through stage 4 chronic kidney disease, or unspecified chronic kidney disease: Secondary | ICD-10-CM | POA: Diagnosis not present

## 2018-11-27 DIAGNOSIS — E119 Type 2 diabetes mellitus without complications: Secondary | ICD-10-CM | POA: Diagnosis not present

## 2018-11-27 DIAGNOSIS — Z79899 Other long term (current) drug therapy: Secondary | ICD-10-CM | POA: Insufficient documentation

## 2018-11-27 DIAGNOSIS — M109 Gout, unspecified: Secondary | ICD-10-CM

## 2018-11-27 DIAGNOSIS — R609 Edema, unspecified: Secondary | ICD-10-CM | POA: Diagnosis not present

## 2018-11-27 DIAGNOSIS — M79672 Pain in left foot: Secondary | ICD-10-CM | POA: Diagnosis not present

## 2018-11-27 DIAGNOSIS — N183 Chronic kidney disease, stage 3 (moderate): Secondary | ICD-10-CM | POA: Diagnosis not present

## 2018-11-27 DIAGNOSIS — R52 Pain, unspecified: Secondary | ICD-10-CM | POA: Diagnosis not present

## 2018-11-27 DIAGNOSIS — M7989 Other specified soft tissue disorders: Secondary | ICD-10-CM | POA: Diagnosis not present

## 2018-11-27 DIAGNOSIS — R5381 Other malaise: Secondary | ICD-10-CM | POA: Diagnosis not present

## 2018-11-27 HISTORY — DX: Type 2 diabetes mellitus without complications: E11.9

## 2018-11-27 HISTORY — DX: Gout, unspecified: M10.9

## 2018-11-27 HISTORY — DX: Essential (primary) hypertension: I10

## 2018-11-27 MED ORDER — TRAMADOL HCL 50 MG PO TABS
50.0000 mg | ORAL_TABLET | Freq: Once | ORAL | Status: AC
  Administered 2018-11-27: 50 mg via ORAL
  Filled 2018-11-27: qty 1

## 2018-11-27 MED ORDER — TRAMADOL HCL 50 MG PO TABS: 50.0000 mg | ORAL_TABLET | Freq: Four times a day (QID) | ORAL | 0 refills | Status: DC | PRN

## 2018-11-27 MED ORDER — PREDNISONE 20 MG PO TABS: 20.0000 mg | ORAL_TABLET | Freq: Every day | ORAL | 0 refills | Status: DC

## 2018-11-27 NOTE — Discharge Instructions (Signed)
Return here as needed. Follow up with your doctor. °

## 2018-11-27 NOTE — ED Triage Notes (Signed)
Pt arrives [facility] for injury/pain to left foot. Pt reports that his foot has been hurting for 2-6 days

## 2018-12-01 NOTE — ED Provider Notes (Signed)
[facility] DEPT Provider Note   CSN: [id number] Arrival date & time: 11/27/18  0908     History   Chief Complaint Chief Complaint  Patient presents with  . Foot Injury    Left    HPI [name] is a 63 y.o. male.     HPI Patient presents to the emergency department with left foot pain over the great toe region mainly.  Patient states the pain started about 4 to 5 days ago.  The patient states that he did not take any medications prior to arrival for his symptoms.  The patient states that nothing seems to make the condition better but certain movements and palpation make the pain worse.  Patient states that he has had a history of gout in the past.  Patient denies any leg swelling, nausea, vomiting, fever, wounds, back pain or numbness. Past Medical History:  Diagnosis Date  . Depression   . Diabetes mellitus without complication ([place])   . GERD (gastroesophageal reflux disease)   . Gout    right foot  . Hemorrhoids   . Hypertension   . Hypertension   . Macular degeneration   . Microcytic anemia   . Prostate cancer ([place]) 08/2009  . Tubular adenoma     Patient Active Problem List   Diagnosis Date Noted  . Insomnia 08/26/2018  . Nephropathy 03/26/2018  . Urinary urgency 03/26/2018  . Type 2 diabetes mellitus with diabetic nephropathy, without long-term current use of insulin ([place]) 03/26/2018  . Essential hypertension 03/26/2018  . Chronic kidney disease (CKD), stage III (moderate) ([place]) 03/26/2018  . Impacted cerumen of both ears 12/25/2017  . Complicated urinary tract infection 09/06/2016  . Rectal bleeding 07/31/2016  . Uncontrolled type 2 diabetes mellitus with hyperglycemia ([place]) 07/31/2016  . Type 2 diabetes mellitus without complication, without long-term current use of insulin ([place]) 05/29/2016  . Dyslipidemia associated with type 2 diabetes mellitus ([place]) 05/29/2016  . Closed nondisplaced spiral fracture of shaft of right tibia    . Right tibial fracture 03/22/2016  . Microcytic anemia 02/17/2012  . Prostate cancer ([place]) 02/17/2012  . 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 Benign hypertension 02/17/2012  . Depressive disorder 02/17/2012    Past Surgical History:  Procedure Laterality Date  . CYSTOSCOPY WITH RETROGRADE PYELOGRAM, URETEROSCOPY AND STENT PLACEMENT Right 07/31/2016   Procedure: CYSTOSCOPY WITH RIGHT  RETROGRADE PYELOGRAM, URETEROSCOPY;  Surgeon: [name], MD;  Location: [facility];  Service: Urology;  Laterality: Right;  . INGUINAL HERNIA REPAIR    . Left ankle joint fusion  1981  . PROSTATE BIOPSY     x 2  . URETERAL REIMPLANTION  07/31/2016   Procedure: URETERAL REIMPLANT, right boari flap, right psoas hitch;  Surgeon: [name], MD;  Location: [facility];  Service: Urology;;        Home Medications    Prior to Admission medications   Medication Sig Start Date End Date Taking? Authorizing Provider  acetaminophen (TYLENOL) 500 MG tablet Take 500 mg by mouth every 6 (six) hours as needed for mild pain or moderate pain.   Yes [provider]  aspirin EC 81 MG tablet Take 81 mg by mouth daily.   Yes [provider]  atorvastatin (LIPITOR) 20 MG tablet TAKE 1 TABLET BY MOUTH EVERY DAY Patient taking differently: Take 20 mg by mouth daily.  10/12/18  Yes [name], [name]  metoprolol tartrate (LOPRESSOR) 50 MG tablet TAKE 1 TABLET BY MOUTH EVERY DAY  Patient taking differently: Take 50 mg by mouth daily. (BETA BLOCKER) 10/12/18  Yes [name], [name]  Multiple Vitamin (MULTIVITAMIN) tablet Take 1 tablet by mouth daily.   Yes [provider]  MYRBETRIQ 25 MG TB24 tablet TAKE 1 TABLET BY MOUTH EVERY DAY Patient taking differently: Take 25 mg by mouth daily.  11/17/18  Yes [name], [name]  olmesartan-hydrochlorothiazide (BENICAR HCT) 40-25 MG tablet TAKE 1 TABLET BY MOUTH EVERY DAY **ONLY 30 DAY SUPPLY UNTIL APPT Patient taking differently: Take 1 tablet by  mouth daily.  10/12/18  Yes [name], [name]  TRADJENTA 5 MG TABS tablet TAKE 1 TABLET BY MOUTH EVERY DAY Patient taking differently: Take 5 mg by mouth daily.  10/07/18  Yes [name], [name]  traZODone (DESYREL) 50 MG tablet Take 1 tablet (50 mg total) by mouth at bedtime. 08/26/18  Yes [name], [name]  polyethylene glycol (MIRALAX / GLYCOLAX) packet Take 17 g by mouth daily. Stop taking if having more than 2 soft/loose bowel movements daily. Patient not taking: Reported on 10/12/2018 08/08/16   [name], MD  predniSONE (DELTASONE) 20 MG tablet Take 1 tablet (20 mg total) by mouth daily. 11/27/18   Alecxis Baltzell, Harrell Gave, PA-C  traMADol (ULTRAM) 50 MG tablet Take 1 tablet (50 mg total) by mouth every 6 (six) hours as needed for severe pain. 11/27/18   Dalia Heading, PA-C    Family History Family History  Problem Relation Age of Onset  . Lung cancer Mother        Deceased  . Throat cancer Brother   . Pancreatic cancer Father   . Heart disease Father        Deceased  . Lung cancer Maternal Uncle        nephew  . Diabetes Other     Social History Social History   Tobacco Use  . Smoking status: Never Smoker  . Smokeless tobacco: Never Used  Substance Use Topics  . Alcohol use: Yes    Comment: beer 2-3 times a week  . Drug use: No     Allergies   Patient has no known allergies.   Review of Systems Review of Systems  All other systems negative except as documented in the HPI. All pertinent positives and negatives as reviewed in the HPI. Physical Exam Updated Vital Signs BP (!) 160/110   Pulse 69   Temp 98.7 F (37.1 C) (Oral)   Resp 18   SpO2 100%   Physical Exam Vitals signs and nursing note reviewed.  Constitutional:      General: He is not in acute distress.    Appearance: He is well-developed.  HENT:     Head: Normocephalic and atraumatic.  Eyes:     Pupils: Pupils are equal, round, and reactive to light.  Pulmonary:     Effort: Pulmonary  effort is normal.  Musculoskeletal:       Feet:  Skin:    General: Skin is warm and dry.  Neurological:     Mental Status: He is alert and oriented to person, place, and time.      ED Treatments / Results  Labs (all labs ordered are listed, but only abnormal results are displayed) Labs Reviewed - No data to display  EKG None  Radiology No results found.  Procedures Procedures (including critical care time)  Medications Ordered in ED Medications  traMADol (ULTRAM) tablet 50 mg (50 mg Oral Given 11/27/18 1224)     Initial Impression / Assessment and  Plan / ED Course  I have reviewed the triage vital signs and the nursing notes.  Pertinent labs & imaging results that were available during my care of the patient were reviewed by me and considered in my medical decision making (see chart for details).        I feel that with the patient significant tenderness increased warmth and slight redness there is most likely a gout flare that is occurring.  Patient is advised this could be cellulitis but seems more likely gout based on his severe pain with just light palpation.  Final Clinical Impressions(s) / ED Diagnoses   Final diagnoses:  Acute gout involving toe of left foot, unspecified cause    ED Discharge Orders         Ordered    traMADol (ULTRAM) 50 MG tablet  Every 6 hours PRN     11/27/18 1212    predniSONE (DELTASONE) 20 MG tablet  Daily     11/27/18 1212           Dalia Heading, PA-C 12/01/18 [place], Wenda Overland, MD 12/01/18 1024

## 2018-12-02 ENCOUNTER — Ambulatory Visit: Payer: Medicare Other | Admitting: Family

## 2018-12-04 DIAGNOSIS — C61 Malignant neoplasm of prostate: Secondary | ICD-10-CM | POA: Diagnosis not present

## 2018-12-04 DIAGNOSIS — N401 Enlarged prostate with lower urinary tract symptoms: Secondary | ICD-10-CM | POA: Diagnosis not present

## 2018-12-04 DIAGNOSIS — N529 Male erectile dysfunction, unspecified: Secondary | ICD-10-CM | POA: Diagnosis not present

## 2018-12-04 DIAGNOSIS — N138 Other obstructive and reflux uropathy: Secondary | ICD-10-CM | POA: Diagnosis not present

## 2018-12-04 DIAGNOSIS — R8290 Unspecified abnormal findings in urine: Secondary | ICD-10-CM | POA: Diagnosis not present

## 2018-12-14 ENCOUNTER — Inpatient Hospital Stay: Admission: RE | Admit: 2018-12-14 | Payer: Medicare Other | Source: Ambulatory Visit

## 2018-12-21 ENCOUNTER — Other Ambulatory Visit: Payer: Self-pay | Admitting: Nurse Practitioner

## 2018-12-21 DIAGNOSIS — G4700 Insomnia, unspecified: Secondary | ICD-10-CM

## 2018-12-23 ENCOUNTER — Telehealth: Payer: Self-pay

## 2018-12-23 NOTE — Telephone Encounter (Signed)
Patient called stating he needed a call back about his appointment I have returned pt call and left him a v/m to call the office. YRL,RMA

## 2018-12-28 DIAGNOSIS — N200 Calculus of kidney: Secondary | ICD-10-CM | POA: Diagnosis not present

## 2018-12-28 DIAGNOSIS — R351 Nocturia: Secondary | ICD-10-CM | POA: Diagnosis not present

## 2018-12-28 DIAGNOSIS — C61 Malignant neoplasm of prostate: Secondary | ICD-10-CM | POA: Diagnosis not present

## 2018-12-28 DIAGNOSIS — N401 Enlarged prostate with lower urinary tract symptoms: Secondary | ICD-10-CM | POA: Diagnosis not present

## 2018-12-29 ENCOUNTER — Other Ambulatory Visit: Payer: Medicare Other

## 2019-01-05 ENCOUNTER — Other Ambulatory Visit: Payer: Self-pay | Admitting: Nurse Practitioner

## 2019-01-06 ENCOUNTER — Other Ambulatory Visit: Payer: Medicare Other

## 2019-01-12 ENCOUNTER — Ambulatory Visit: Payer: Medicare Other | Admitting: Nurse Practitioner

## 2019-01-12 DIAGNOSIS — L570 Actinic keratosis: Secondary | ICD-10-CM | POA: Diagnosis not present

## 2019-01-12 DIAGNOSIS — Z85828 Personal history of other malignant neoplasm of skin: Secondary | ICD-10-CM | POA: Diagnosis not present

## 2019-01-12 DIAGNOSIS — L918 Other hypertrophic disorders of the skin: Secondary | ICD-10-CM | POA: Diagnosis not present

## 2019-01-12 DIAGNOSIS — L738 Other specified follicular disorders: Secondary | ICD-10-CM | POA: Diagnosis not present

## 2019-01-12 DIAGNOSIS — D2261 Melanocytic nevi of right upper limb, including shoulder: Secondary | ICD-10-CM | POA: Diagnosis not present

## 2019-01-12 DIAGNOSIS — L218 Other seborrheic dermatitis: Secondary | ICD-10-CM | POA: Diagnosis not present

## 2019-01-12 DIAGNOSIS — D1801 Hemangioma of skin and subcutaneous tissue: Secondary | ICD-10-CM | POA: Diagnosis not present

## 2019-01-12 DIAGNOSIS — D225 Melanocytic nevi of trunk: Secondary | ICD-10-CM | POA: Diagnosis not present

## 2019-01-12 DIAGNOSIS — L821 Other seborrheic keratosis: Secondary | ICD-10-CM | POA: Diagnosis not present

## 2019-01-12 DIAGNOSIS — L298 Other pruritus: Secondary | ICD-10-CM | POA: Diagnosis not present

## 2019-01-14 ENCOUNTER — Ambulatory Visit: Payer: Self-pay | Admitting: Internal Medicine

## 2019-01-15 DIAGNOSIS — N183 Chronic kidney disease, stage 3 unspecified: Secondary | ICD-10-CM | POA: Diagnosis not present

## 2019-01-15 DIAGNOSIS — Z87442 Personal history of urinary calculi: Secondary | ICD-10-CM | POA: Diagnosis not present

## 2019-01-15 DIAGNOSIS — I129 Hypertensive chronic kidney disease with stage 1 through stage 4 chronic kidney disease, or unspecified chronic kidney disease: Secondary | ICD-10-CM | POA: Diagnosis not present

## 2019-01-15 DIAGNOSIS — D631 Anemia in chronic kidney disease: Secondary | ICD-10-CM | POA: Diagnosis not present

## 2019-01-19 ENCOUNTER — Other Ambulatory Visit: Payer: Self-pay | Admitting: Nurse Practitioner

## 2019-01-19 DIAGNOSIS — G4700 Insomnia, unspecified: Secondary | ICD-10-CM

## 2019-01-19 NOTE — Telephone Encounter (Signed)
Please refill patient's med

## 2019-01-20 ENCOUNTER — Ambulatory Visit: Payer: Medicare Other | Admitting: Nurse Practitioner

## 2019-01-21 ENCOUNTER — Ambulatory Visit: Payer: Self-pay | Admitting: Internal Medicine

## 2019-01-21 ENCOUNTER — Other Ambulatory Visit: Payer: Self-pay | Admitting: Nurse Practitioner

## 2019-01-21 DIAGNOSIS — R350 Frequency of micturition: Secondary | ICD-10-CM

## 2019-01-21 DIAGNOSIS — R32 Unspecified urinary incontinence: Secondary | ICD-10-CM

## 2019-01-25 ENCOUNTER — Ambulatory Visit
Admission: RE | Admit: 2019-01-25 | Discharge: 2019-01-25 | Disposition: A | Discharge status: Home / Self Care | Payer: Medicare Other | Source: Ambulatory Visit | Attending: Nephrology | Admitting: Nephrology

## 2019-01-25 DIAGNOSIS — N189 Chronic kidney disease, unspecified: Secondary | ICD-10-CM | POA: Diagnosis not present

## 2019-01-25 DIAGNOSIS — N183 Chronic kidney disease, stage 3 unspecified: Secondary | ICD-10-CM

## 2019-01-27 ENCOUNTER — Other Ambulatory Visit: Payer: Self-pay | Admitting: Urology

## 2019-01-27 DIAGNOSIS — C61 Malignant neoplasm of prostate: Secondary | ICD-10-CM

## 2019-02-02 ENCOUNTER — Ambulatory Visit (INDEPENDENT_AMBULATORY_CARE_PROVIDER_SITE_OTHER): Payer: Medicare Other | Admitting: Nurse Practitioner

## 2019-02-02 ENCOUNTER — Other Ambulatory Visit: Payer: Self-pay

## 2019-02-02 ENCOUNTER — Encounter: Payer: Self-pay | Admitting: Nurse Practitioner

## 2019-02-02 VITALS — BP 110/76 | HR 70 | Temp 98.4°F | Ht 64.2 in | Wt 197.0 lb

## 2019-02-02 DIAGNOSIS — E1121 Type 2 diabetes mellitus with diabetic nephropathy: Secondary | ICD-10-CM

## 2019-02-02 DIAGNOSIS — N1832 Chronic kidney disease, stage 3b: Secondary | ICD-10-CM | POA: Diagnosis not present

## 2019-02-02 DIAGNOSIS — I10 Essential (primary) hypertension: Secondary | ICD-10-CM

## 2019-02-02 MED ORDER — RYBELSUS 7 MG PO TABS: 1.0000 | ORAL_TABLET | Freq: Every day | ORAL | 0 refills | Status: DC

## 2019-02-02 MED ORDER — OLMESARTAN MEDOXOMIL-HCTZ 40-25 MG PO TABS: 1.0000 | ORAL_TABLET | Freq: Every day | ORAL | 1 refills | Status: DC

## 2019-02-02 NOTE — Progress Notes (Signed)
Subjective:     Patient ID: [name] , male    DOB: 07/21/1955 , 63 y.o.   MRN: [id number]   Chief Complaint  Patient presents with  . Diabetes    patient's blood sugar is 226    HPI  Diabetes He presents for his follow-up diabetic visit. He has type 2 diabetes mellitus. His disease course has been stable. There are no hypoglycemic associated symptoms. Pertinent negatives for hypoglycemia include no dizziness, headaches or nervousness/anxiousness. Pertinent negatives for diabetes include no chest pain, no polydipsia, no polyphagia and no polyuria. There are no hypoglycemic complications. Symptoms are worsening. There are no diabetic complications. Risk factors for coronary artery disease include obesity, male sex, sedentary lifestyle and diabetes mellitus. Current diabetic treatment includes oral agent (dual therapy). He is compliant with treatment all of the time. He is following a generally unhealthy diet. When asked about meal planning, he reported none. He has not had a previous visit with a dietitian. (226 today in office)     Past Medical History:  Diagnosis Date  . Depression   . Diabetes mellitus without complication ([facility])   . GERD (gastroesophageal reflux disease)   . Gout    right foot  . Hemorrhoids   . Hypertension   . Hypertension   . Macular degeneration   . Microcytic anemia   . Prostate cancer ([facility]) 08/2009  . Tubular adenoma      Family History  Problem Relation Age of Onset  . Lung cancer Mother        Deceased  . Throat cancer Brother   . Pancreatic cancer Father   . Heart disease Father        Deceased  . Lung cancer Maternal Uncle        nephew  . Diabetes Other      Current Outpatient Medications:  .  acetaminophen (TYLENOL) 500 MG tablet, Take 500 mg by mouth every 6 (six) hours as needed for mild pain or moderate pain., Disp: , Rfl:  .  aspirin EC 81 MG tablet, Take 81 mg by mouth daily., Disp: , Rfl:  .  atorvastatin (LIPITOR) 20 MG  tablet, TAKE 1 TABLET BY MOUTH EVERY DAY (Patient taking differently: Take 20 mg by mouth daily. ), Disp: 90 tablet, Rfl: 1 .  metoprolol tartrate (LOPRESSOR) 50 MG tablet, TAKE 1 TABLET BY MOUTH EVERY DAY (Patient taking differently: Take 50 mg by mouth daily. (BETA BLOCKER)), Disp: 90 tablet, Rfl: 1 .  Multiple Vitamin (MULTIVITAMIN) tablet, Take 1 tablet by mouth daily., Disp: , Rfl:  .  MYRBETRIQ 25 MG TB24 tablet, TAKE 1 TABLET BY MOUTH EVERY DAY, Disp: 30 tablet, Rfl: 1 .  olmesartan-hydrochlorothiazide (BENICAR HCT) 40-25 MG tablet, TAKE 1 TABLET BY MOUTH EVERY DAY **ONLY 30 DAY SUPPLY UNTIL APPT (Patient taking differently: Take 1 tablet by mouth daily. ), Disp: 90 tablet, Rfl: 1 .  TRADJENTA 5 MG TABS tablet, TAKE 1 TABLET BY MOUTH EVERY DAY, Disp: 90 tablet, Rfl: 0 .  traMADol (ULTRAM) 50 MG tablet, Take 1 tablet (50 mg total) by mouth every 6 (six) hours as needed for severe pain., Disp: 15 tablet, Rfl: 0 .  traZODone (DESYREL) 50 MG tablet, TAKE 1 TABLET BY MOUTH EVERYDAY AT BEDTIME, Disp: 30 tablet, Rfl: 0   No Known Allergies   Review of Systems  Constitutional: Negative.   Respiratory: Negative.   Cardiovascular: Negative for chest pain, palpitations and leg swelling.  Endocrine: Negative for polydipsia, polyphagia and   polyuria.  Neurological: Negative for dizziness and headaches.  Psychiatric/Behavioral: Negative.  The patient is not nervous/anxious.      Today's Vitals   02/02/19 0941  BP: 110/76  Pulse: 70  Temp: 98.4 F (36.9 C)  TempSrc: Oral  Weight: 197 lb (89.4 kg)  Height: 5' 4.2" (1.631 m)  PainSc: 6   PainLoc: Back   Body mass index is 33.6 kg/m.   Objective:  Physical Exam Constitutional:      Appearance: Normal appearance.  Cardiovascular:     Rate and Rhythm: Normal rate and regular rhythm.     Pulses: Normal pulses.     Heart sounds: Normal heart sounds. No murmur.  Pulmonary:     Effort: Pulmonary effort is normal. No respiratory distress.      Breath sounds: Normal breath sounds.  Skin:    Capillary Refill: Capillary refill takes less than 2 seconds.  Neurological:     General: No focal deficit present.     Mental Status: He is alert and oriented to person, place, and time.  Psychiatric:        Mood and Affect: Mood normal.        Behavior: Behavior normal.        Thought Content: Thought content normal.        Judgment: Judgment normal.         Assessment And Plan:     1. Stage 3b chronic kidney disease  Likely related to poorly controlled diabetes - CCM Nurse - Semaglutide (RYBELSUS) 7 MG TABS; Take 1 tablet by mouth daily. Take 30 minutes before breakfast  Dispense: 30 tablet; Refill: 0  2. Type 2 diabetes mellitus with diabetic nephropathy, without long-term current use of insulin ([facility])  Chronic, poorly controlled  Will start him on Rybelsus  I will also refer him to CCM Nurse to help with education of disease process - CCM Nurse - Hemoglobin A1c - CMP14+EGFR - Semaglutide (RYBELSUS) 7 MG TABS; Take 1 tablet by mouth daily. Take 30 minutes before breakfast  Dispense: 30 tablet; Refill: 0    , [name]    THE PATIENT IS ENCOURAGED TO PRACTICE SOCIAL DISTANCING DUE TO THE COVID-19 PANDEMIC.   

## 2019-02-03 LAB — CMP14+EGFR
ALT: 39 IU/L (ref 0–44)
AST: 23 IU/L (ref 0–40)
Albumin/Globulin Ratio: 1.4 (ref 1.2–2.2)
Albumin: 4.2 g/dL (ref 3.8–4.8)
Alkaline Phosphatase: 162 IU/L — ABNORMAL HIGH (ref 39–117)
BUN/Creatinine Ratio: 19 (ref 10–24)
BUN: 32 mg/dL — ABNORMAL HIGH (ref 8–27)
Bilirubin Total: 0.3 mg/dL (ref 0.0–1.2)
CO2: 23 mmol/L (ref 20–29)
Calcium: 9.5 mg/dL (ref 8.6–10.2)
Chloride: 101 mmol/L (ref 96–106)
Creatinine, Ser: 1.69 mg/dL — ABNORMAL HIGH (ref 0.76–1.27)
GFR calc Af Amer: 48 mL/min/{1.73_m2} — ABNORMAL LOW (ref >59)
GFR calc non Af Amer: 42 mL/min/{1.73_m2} — ABNORMAL LOW (ref >59)
Globulin, Total: 2.9 g/dL (ref 1.5–4.5)
Glucose: 193 mg/dL — ABNORMAL HIGH (ref 65–99)
Potassium: 4.8 mmol/L (ref 3.5–5.2)
Sodium: 138 mmol/L (ref 134–144)
Total Protein: 7.1 g/dL (ref 6.0–8.5)

## 2019-02-03 LAB — HEMOGLOBIN A1C
Est. average glucose Bld gHb Est-mCnc: 223 mg/dL
Hgb A1c MFr Bld: 9.4 % — ABNORMAL HIGH (ref 4.8–5.6)

## 2019-02-08 DIAGNOSIS — L84 Corns and callosities: Secondary | ICD-10-CM | POA: Diagnosis not present

## 2019-02-08 DIAGNOSIS — I739 Peripheral vascular disease, unspecified: Secondary | ICD-10-CM | POA: Diagnosis not present

## 2019-02-08 DIAGNOSIS — L603 Nail dystrophy: Secondary | ICD-10-CM | POA: Diagnosis not present

## 2019-02-08 DIAGNOSIS — E1151 Type 2 diabetes mellitus with diabetic peripheral angiopathy without gangrene: Secondary | ICD-10-CM | POA: Diagnosis not present

## 2019-02-09 ENCOUNTER — Ambulatory Visit: Payer: Self-pay

## 2019-02-09 DIAGNOSIS — N1832 Chronic kidney disease, stage 3b: Secondary | ICD-10-CM

## 2019-02-09 DIAGNOSIS — E1121 Type 2 diabetes mellitus with diabetic nephropathy: Secondary | ICD-10-CM

## 2019-02-09 DIAGNOSIS — I10 Essential (primary) hypertension: Secondary | ICD-10-CM

## 2019-02-09 NOTE — Chronic Care Management (AMB) (Signed)
  Chronic Care Management   Outreach Note  02/09/2019 Name: [name] MRN: [id number] DOB: [birth year]  Referred by: [name], [name] Reason for referral : Care Coordination   An unsuccessful telephone outreach was attempted today. The patient was referred to the case management team by for assistance with care management and care coordination.   Follow Up Plan: A HIPPA compliant phone message was left for the patient providing contact information and requesting a return call.  The care management team will reach out to the patient again over the next 21 days.   [name], BSW, CDP Social Worker, Certified Dementia Practitioner [place] [facility] [phone]

## 2019-02-22 ENCOUNTER — Other Ambulatory Visit: Payer: Self-pay

## 2019-02-22 ENCOUNTER — Ambulatory Visit
Admission: RE | Admit: 2019-02-22 | Discharge: 2019-02-22 | Disposition: A | Discharge status: Home / Self Care | Payer: Medicare Other | Source: Ambulatory Visit | Attending: Urology | Admitting: Urology

## 2019-02-22 DIAGNOSIS — C61 Malignant neoplasm of prostate: Secondary | ICD-10-CM | POA: Diagnosis not present

## 2019-02-22 MED ORDER — GADOBENATE DIMEGLUMINE 529 MG/ML IV SOLN
9.0000 mL | Freq: Once | INTRAVENOUS | Status: AC | PRN
  Administered 2019-02-22: 9 mL via INTRAVENOUS

## 2019-02-24 ENCOUNTER — Ambulatory Visit: Payer: Self-pay

## 2019-02-24 DIAGNOSIS — E1121 Type 2 diabetes mellitus with diabetic nephropathy: Secondary | ICD-10-CM

## 2019-02-24 DIAGNOSIS — N1832 Chronic kidney disease, stage 3b: Secondary | ICD-10-CM

## 2019-02-24 NOTE — Chronic Care Management (AMB) (Signed)
  Chronic Care Management   Outreach Note  02/24/2019 Name: [name] MRN: [id number] DOB: 09-Oct-1955  Referred by: [name], [name] Reason for referral : Care Coordination   A second unsuccessful telephone outreach was attempted today. The patient was referred to the case management team for assistance with care management and care coordination.   Follow Up Plan: A HIPPA compliant phone message was left for the patient providing contact information and requesting a return call.  The care management team will reach out to the patient again over the next 14 days.   [name], BSW, CDP Social Worker, Certified Dementia Practitioner [place] [facility] [place] [phone]

## 2019-03-05 ENCOUNTER — Ambulatory Visit: Payer: Self-pay

## 2019-03-05 DIAGNOSIS — I10 Essential (primary) hypertension: Secondary | ICD-10-CM

## 2019-03-05 DIAGNOSIS — N1832 Chronic kidney disease, stage 3b: Secondary | ICD-10-CM

## 2019-03-05 DIAGNOSIS — E1121 Type 2 diabetes mellitus with diabetic nephropathy: Secondary | ICD-10-CM

## 2019-03-05 NOTE — Chronic Care Management (AMB) (Signed)
  Care Management Note   [name] is a 63 y.o. year old male who is a primary care patient of [name], [place] . The CM team was consulted for assistance with care coordination.   Review of patient status, including review of consultants reports, rand collaboration with appropriate care team members and the patient's provider was performed as part of comprehensive patient evaluation and provision of care management services. Telephone outreach to patient today to introduce CM services.   SW placed a successful outbound call to the patient to discuss recent referral to CM program. SW did not conduct an SDOH screen today as the patient stated "I will wait for the nurse to call me and if I need something I will have her tell you".  Outpatient Encounter Medications as of 03/05/2019  Medication Sig  . acetaminophen (TYLENOL) 500 MG tablet Take 500 mg by mouth every 6 (six) hours as needed for mild pain or moderate pain.  Marland Kitchen aspirin EC 81 MG tablet Take 81 mg by mouth daily.  Marland Kitchen atorvastatin (LIPITOR) 20 MG tablet TAKE 1 TABLET BY MOUTH EVERY DAY (Patient taking differently: Take 20 mg by mouth daily. )  . metoprolol tartrate (LOPRESSOR) 50 MG tablet TAKE 1 TABLET BY MOUTH EVERY DAY (Patient taking differently: Take 50 mg by mouth daily. (BETA BLOCKER))  . Multiple Vitamin (MULTIVITAMIN) tablet Take 1 tablet by mouth daily.  Marland Kitchen MYRBETRIQ 25 MG TB24 tablet TAKE 1 TABLET BY MOUTH EVERY DAY  . olmesartan-hydrochlorothiazide (BENICAR HCT) 40-25 MG tablet Take 1 tablet by mouth daily.  . Semaglutide (RYBELSUS) 7 MG TABS Take 1 tablet by mouth daily. Take 30 minutes before breakfast  . TRADJENTA 5 MG TABS tablet TAKE 1 TABLET BY MOUTH EVERY DAY  . traMADol (ULTRAM) 50 MG tablet Take 1 tablet (50 mg total) by mouth every 6 (six) hours as needed for severe pain.  . traZODone (DESYREL) 50 MG tablet TAKE 1 TABLET BY MOUTH EVERYDAY AT BEDTIME   No facility-administered encounter medications on file as of  03/05/2019.    I reached out to [name] by phone today.   Mr. Nickolson was given information about Chronic Care Management services today including:  1. CCM service includes personalized support from designated clinical staff supervised by his physician, including individualized plan of care and coordination with other care providers 2. 24/7 contact phone numbers for assistance for urgent and routine care needs. 3. Service will only be billed when office clinical staff spend 20 minutes or more in a month to coordinate care. 4. Only one practitioner may furnish and bill the service in a calendar month. 5. The patient may stop CCM services at any time (effective at the end of the month) by phone call to the office staff. 6. The patient will be responsible for cost sharing (co-pay) of up to 20% of the service fee (after annual deductible is met).   Patient agreed to services and verbal consent obtained.    Follow Up Plan: Collaboration with RN Case Manager regarding enrollment. RN Case Manager will contact the patient over the next 6 weeks.   [name], BSW, CDP Social Worker, Certified Dementia Practitioner [place] [facility] [phone]

## 2019-03-19 DIAGNOSIS — N401 Enlarged prostate with lower urinary tract symptoms: Secondary | ICD-10-CM | POA: Diagnosis present

## 2019-03-25 ENCOUNTER — Other Ambulatory Visit: Payer: Self-pay

## 2019-03-25 ENCOUNTER — Telehealth (INDEPENDENT_AMBULATORY_CARE_PROVIDER_SITE_OTHER): Payer: Medicare Other | Admitting: Nurse Practitioner

## 2019-03-25 ENCOUNTER — Encounter: Payer: Self-pay | Admitting: Nurse Practitioner

## 2019-03-25 DIAGNOSIS — R0989 Other specified symptoms and signs involving the circulatory and respiratory systems: Secondary | ICD-10-CM

## 2019-03-25 MED ORDER — GUAIFENESIN ER 600 MG PO TB12: 600.0000 mg | ORAL_TABLET | Freq: Two times a day (BID) | ORAL | 2 refills | Status: AC

## 2019-03-25 NOTE — Progress Notes (Signed)
Virtual Visit via Telephone   This visit type was conducted due to national recommendations for restrictions regarding the COVID-19 Pandemic (e.g. social distancing) in an effort to limit this patient's exposure and mitigate transmission in our community.  Due to his co-morbid illnesses, this patient is at least at moderate risk for complications without adequate follow up.  This format is felt to be most appropriate for this patient at this time.  All issues noted in this document were discussed and addressed.  A limited physical exam was performed with this format.    This visit type was conducted due to national recommendations for restrictions regarding the COVID-19 Pandemic (e.g. social distancing) in an effort to limit this patient's exposure and mitigate transmission in our community.  Patients identity confirmed using two different identifiers.  This format is felt to be most appropriate for this patient at this time.  All issues noted in this document were discussed and addressed.  No physical exam was performed (except for noted visual exam findings with Video Visits).    Date:  04/02/2019   ID:  [name], DOB 1955-07-17, MRN [id number]  Patient Location:  Home - spoke with [name]  Provider location:   Office    Chief Complaint:  Wheezing last week  History of Present Illness:    [name] is a 64 y.o. male who presents via video conferencing for a telehealth visit today.    The patient does have symptoms concerning for COVID-19 infection (fever, chills, cough, or new shortness of breath).   He feels like it is clearing up now.  No fever.  He is taking triaminic.  He has chest congestion.  Denies shortness of breath.  He is still eating okay.    Cough This is a new problem. The current episode started in the past 7 days. The cough is non-productive. Associated symptoms include nasal congestion. Pertinent negatives include no chest pain, chills, ear congestion,  fever, headaches or sore throat. He has tried nothing for the symptoms. The treatment provided no relief. There is no history of asthma.  Diabetes He presents for his follow-up diabetic visit. He has type 2 diabetes mellitus. His disease course has been stable. Pertinent negatives for hypoglycemia include no dizziness or headaches. Pertinent negatives for diabetes include no chest pain.     Past Medical History:  Diagnosis Date  . Closed nondisplaced spiral fracture of shaft of right tibia   . Complicated urinary tract infection 09/06/2016  . Depression   . Diabetes mellitus without complication ([place])   . GERD (gastroesophageal reflux disease)   . Gout    right foot  . Hemorrhoids   . Hypertension   . Hypertension   . Macular degeneration   . Microcytic anemia   . Prostate cancer ([place]) 08/2009  . Rectal bleeding 07/31/2016  . Tubular adenoma    Past Surgical History:  Procedure Laterality Date  . CYSTOSCOPY WITH RETROGRADE PYELOGRAM, URETEROSCOPY AND STENT PLACEMENT Right 07/31/2016   Procedure: CYSTOSCOPY WITH RIGHT  RETROGRADE PYELOGRAM, URETEROSCOPY;  Surgeon: [name], MD;  Location: [facility];  Service: Urology;  Laterality: Right;  . INGUINAL HERNIA REPAIR    . Left ankle joint fusion  1981  . PROSTATE BIOPSY     x 2  . URETERAL REIMPLANTION  07/31/2016   Procedure: URETERAL REIMPLANT, right boari flap, right psoas hitch;  Surgeon: [name], MD;  Location: [facility];  Service: Urology;;     Current  Meds  Medication Sig  . acetaminophen (TYLENOL) 500 MG tablet Take 500 mg by mouth every 6 (six) hours as needed for mild pain or moderate pain.  Marland Kitchen aspirin EC 81 MG tablet Take 81 mg by mouth daily.  . metoprolol tartrate (LOPRESSOR) 50 MG tablet TAKE 1 TABLET BY MOUTH EVERY DAY (Patient taking differently: Take 50 mg by mouth daily. (BETA BLOCKER))  . Multiple Vitamin (MULTIVITAMIN) tablet Take 1 tablet by mouth daily.  Marland Kitchen MYRBETRIQ 25 MG TB24 tablet TAKE 1 TABLET  BY MOUTH EVERY DAY  . olmesartan-hydrochlorothiazide (BENICAR HCT) 40-25 MG tablet Take 1 tablet by mouth daily.  . Semaglutide (RYBELSUS) 7 MG TABS Take 1 tablet by mouth daily. Take 30 minutes before breakfast  . traMADol (ULTRAM) 50 MG tablet Take 1 tablet (50 mg total) by mouth every 6 (six) hours as needed for severe pain.  . traZODone (DESYREL) 50 MG tablet TAKE 1 TABLET BY MOUTH EVERYDAY AT BEDTIME  . [DISCONTINUED] atorvastatin (LIPITOR) 20 MG tablet TAKE 1 TABLET BY MOUTH EVERY DAY (Patient taking differently: Take 20 mg by mouth daily. )  . [DISCONTINUED] TRADJENTA 5 MG TABS tablet TAKE 1 TABLET BY MOUTH EVERY DAY     Allergies:   Patient has no known allergies.   Social History   Tobacco Use  . Smoking status: Never Smoker  . Smokeless tobacco: Never Used  Substance Use Topics  . Alcohol use: Yes    Comment: beer 2-3 times a week  . Drug use: No     Family Hx: The patient's family history includes Diabetes in an other family member; Heart disease in his father; Lung cancer in his maternal uncle and mother; Pancreatic cancer in his father; Throat cancer in his brother.  ROS:   Please see the history of present illness.    Review of Systems  Constitutional: Negative for chills and fever.  HENT: Negative for sore throat.   Respiratory: Positive for cough.   Cardiovascular: Negative for chest pain.  Neurological: Negative for dizziness and headaches.  Psychiatric/Behavioral: Negative.     All other systems reviewed and are negative.   Labs/Other Tests and Data Reviewed:    Recent Labs: 02/02/2019: ALT 39; BUN 32; Creatinine, Ser 1.69; Potassium 4.8; Sodium 138   Recent Lipid Panel Lab Results  Component Value Date/Time   CHOL 150 03/26/2018 09:53 AM   TRIG 172 (H) 03/26/2018 09:53 AM   HDL 35 (L) 03/26/2018 09:53 AM   CHOLHDL 4.3 03/26/2018 09:53 AM   CHOLHDL 6.2 04/19/2008 04:30 AM   LDLCALC 81 03/26/2018 09:53 AM    Wt Readings from Last 3 Encounters:    02/02/19 197 lb (89.4 kg)  10/12/18 195 lb 3.2 oz (88.5 kg)  09/03/18 199 lb 12.8 oz (90.6 kg)     Exam:    Vital Signs:  There were no vitals taken for this visit.    Physical Exam  Constitutional: He is oriented to person, place, and time. No distress.  Pulmonary/Chest: No respiratory distress.  Neurological: He is alert and oriented to person, place, and time.  Psychiatric: Mood, memory, affect and judgment normal.    ASSESSMENT & PLAN:    1. Chest congestion  Has had chest congestion for more than 2 weeks, he feels is clearing up  Will call in mucinex to use as needed  If symptoms worsen or shortness of breath he is advised to seek ER medical attention. - guaiFENesin (MUCINEX) 600 MG 12 hr tablet; Take 1 tablet (  600 mg total) by mouth 2 (two) times daily.  Dispense: 60 tablet; Refill: 2    COVID-19 Education: The signs and symptoms of COVID-19 were discussed with the patient and how to seek care for testing (follow up with PCP or arrange E-visit).  The importance of social distancing was discussed today.  Patient Risk:   After full review of this patients clinical status, I feel that they are at least moderate risk at this time.  Time:   Today, I have spent 10 minutes/ seconds with the patient with telehealth technology discussing above diagnoses.     Medication Adjustments/Labs and Tests Ordered: Current medicines are reviewed at length with the patient today.  Concerns regarding medicines are outlined above.   Tests Ordered: No orders of the defined types were placed in this encounter.   Medication Changes: Meds ordered this encounter  Medications  . guaiFENesin (MUCINEX) 600 MG 12 hr tablet    Sig: Take 1 tablet (600 mg total) by mouth 2 (two) times daily.    Dispense:  60 tablet    Refill:  2    Disposition:  Follow up prn  Signed, [name], [name]

## 2019-03-30 ENCOUNTER — Other Ambulatory Visit: Payer: Self-pay | Admitting: Nurse Practitioner

## 2019-03-31 ENCOUNTER — Other Ambulatory Visit: Payer: Self-pay | Admitting: Nurse Practitioner

## 2019-04-02 ENCOUNTER — Encounter: Payer: Self-pay | Admitting: Nurse Practitioner

## 2019-04-07 ENCOUNTER — Other Ambulatory Visit: Payer: Self-pay | Admitting: Nurse Practitioner

## 2019-04-07 DIAGNOSIS — E1121 Type 2 diabetes mellitus with diabetic nephropathy: Secondary | ICD-10-CM

## 2019-04-07 DIAGNOSIS — N1832 Chronic kidney disease, stage 3b: Secondary | ICD-10-CM

## 2019-04-13 ENCOUNTER — Other Ambulatory Visit: Payer: Self-pay | Admitting: Nurse Practitioner

## 2019-04-16 ENCOUNTER — Other Ambulatory Visit: Payer: Self-pay | Admitting: Nurse Practitioner

## 2019-04-16 DIAGNOSIS — R32 Unspecified urinary incontinence: Secondary | ICD-10-CM

## 2019-04-16 DIAGNOSIS — R350 Frequency of micturition: Secondary | ICD-10-CM

## 2019-04-19 ENCOUNTER — Telehealth: Payer: Self-pay

## 2019-04-22 ENCOUNTER — Other Ambulatory Visit: Payer: Self-pay

## 2019-04-22 ENCOUNTER — Ambulatory Visit (INDEPENDENT_AMBULATORY_CARE_PROVIDER_SITE_OTHER): Payer: Medicare (Managed Care)

## 2019-04-22 ENCOUNTER — Telehealth: Payer: Medicare Other

## 2019-04-22 DIAGNOSIS — N1832 Chronic kidney disease, stage 3b: Secondary | ICD-10-CM

## 2019-04-22 DIAGNOSIS — I10 Essential (primary) hypertension: Secondary | ICD-10-CM

## 2019-04-22 DIAGNOSIS — E1121 Type 2 diabetes mellitus with diabetic nephropathy: Secondary | ICD-10-CM

## 2019-04-26 NOTE — Chronic Care Management (AMB) (Signed)
Chronic Care Management   Initial Visit Note  04/22/2019 Name: [name] MRN: [id number] DOB: Aug 11, 1955  Referred by: [name], [name] Reason for referral : Chronic Care Management (1st Attempt [place] Telephone )   [name] is a 64 y.o. year old male who is a primary care patient of [name], [place]. The CCM team was consulted for assistance with chronic disease management and care coordination needs related to HTN, DMII and CKD Stage III  Review of patient status, including review of consultants reports, relevant laboratory and other test results, and collaboration with appropriate care team members and the patient's provider was performed as part of comprehensive patient evaluation and provision of chronic care management services.    SDOH (Social Determinants of Health) screening performed today: None. See Care Plan for related entries.   Placed outbound initial CCM RN CM call to patient to assess for CCM RN CM needs and a care plan was established.   Medications: Outpatient Encounter Medications as of 04/22/2019  Medication Sig  . acetaminophen (TYLENOL) 500 MG tablet Take 500 mg by mouth every 6 (six) hours as needed for mild pain or moderate pain.  Marland Kitchen aspirin EC 81 MG tablet Take 81 mg by mouth daily.  Marland Kitchen atorvastatin (LIPITOR) 20 MG tablet TAKE 1 TABLET BY MOUTH EVERY DAY  . guaiFENesin (MUCINEX) 600 MG 12 hr tablet Take 1 tablet (600 mg total) by mouth 2 (two) times daily.  . metoprolol tartrate (LOPRESSOR) 50 MG tablet TAKE 1 TABLET BY MOUTH EVERY DAY  . Multiple Vitamin (MULTIVITAMIN) tablet Take 1 tablet by mouth daily.  Marland Kitchen MYRBETRIQ 25 MG TB24 tablet TAKE 1 TABLET BY MOUTH EVERY DAY  . olmesartan-hydrochlorothiazide (BENICAR HCT) 40-25 MG tablet Take 1 tablet by mouth daily.  . RYBELSUS 7 MG TABS TAKE 1 TABLET BY MOUTH DAILY. TAKE 30 MINUTES BEFORE BREAKFAST  . TRADJENTA 5 MG TABS tablet TAKE 1 TABLET BY MOUTH EVERY DAY  . traMADol (ULTRAM) 50 MG tablet Take 1  tablet (50 mg total) by mouth every 6 (six) hours as needed for severe pain.  . traZODone (DESYREL) 50 MG tablet TAKE 1 TABLET BY MOUTH EVERYDAY AT BEDTIME   No facility-administered encounter medications on file as of 04/22/2019.     Objective:  Lab Results  Component Value Date   HGBA1C 9.4 (H) 02/02/2019   HGBA1C 10.1 (H) 09/03/2018   HGBA1C 8.4 (H) 03/26/2018   Lab Results  Component Value Date   MICROALBUR 150 03/26/2018   LDLCALC 81 03/26/2018   CREATININE 1.69 (H) 02/02/2019   BP Readings from Last 3 Encounters:  02/02/19 110/76  11/27/18 (!) 160/110  10/12/18 124/80    Goals Addressed      Patient Stated   . "I would like to keep my BP under good control" (pt-stated)       Current Barriers:  Marland Kitchen Knowledge Deficits related to disease process and Self Health management of HTN . Chronic Disease Management support and education needs related to HTN, DMII, CKDIII  Nurse Case Manager Clinical Goal(s):  Marland Kitchen Over the next 90 days, patient will work with the [place] CM and PCP to address needs related to disease education and support to maintain BP <130/80  Interventions:  . Evaluation of current treatment plan related to Hypertension and patient's adherence to plan as established by provider. . Provided education to patient re: target BP <130/80; discussed importance of self monitoring BP at home to check for consistency with  BP; education provided about the importance of restricting Sodium and taking BP medications exactly as prescribed; education provided related to how uncontrolled HTN may cause kidney damage  . Reviewed medications with patient and discussed indication, dosage and frequency of BP meds and patient is adhering to taking his medications exactly as prescribed w/o missed doses . Discussed plans with patient for ongoing care management follow up and provided patient with direct contact information for care management team . Advised patient, providing education and  rationale, to monitor blood pressure daily and record, calling the CCM team and or PCP for findings outside established parameters.  . Provided patient with printed educational materials related to Why Should I Restrict Sodium? Life's Simple 7  Patient Self Care Activities:  . Self administers medications as prescribed . Attends all scheduled provider appointments . Calls pharmacy for medication refills . Calls provider office for new concerns or questions   Initial goal documentation     . "I would like to lower my A1C" (pt-stated)       Current Barriers:  Marland Kitchen Knowledge Deficits related to disease process and Self Health management of DM . Chronic Disease Management support and education needs related to DMII, HTN, CKDIII  Nurse Case Manager Clinical Goal(s):  Marland Kitchen Over the next 90 days, patient will work with the [place] CM and PCP to address needs related to disease management and support for improved Self Health management of Diabetes . Over the next 90 days, patient will lower his A1C <9.4  Interventions:  . Evaluation of current treatment plan related to Diabetes and patient's adherence to plan as established by provider. . Provided education to patient re: target A1C of <7.0; reviewed and discussed most recent A1C of 9.4 obtained on 02/02/19; education provided on how to achieve this goal, including medication adherence, diet adherence, implementation of daily/weekly exercise . Reviewed medications with patient and discussed indication , dosage and frequency of diabetic medications and patient verbalizes understanding, he reports having good adherence without missed doses . Collaborated with Lottie Dawson Pharm D via in basket message requesting outreach to patient to review his current diabetic regimen to ensure no further recommendations are needed . Discussed plans with patient for ongoing care management follow up and provided patient with direct contact information for care management  team . Provided patient with printed educational materials related to Diabetes Disease Management with Meal Planning, Counting Carbs, Diabetes Zone Safety Tool, signs/symptoms of hypo/hyperglycemia . Advised patient, providing education and rationale, to check cbg daily before meals and record, calling the PCP and or CCM team for findings outside established parameters.    Patient Self Care Activities:  . Attends all scheduled provider appointments . Calls pharmacy for medication refills . Calls provider office for new concerns or questions   Initial goal documentation     . "I would like to save my kidneys" (pt-stated)       Current Barriers:  Marland Kitchen Knowledge Deficits related to disease process and Self Health management of CKD . Chronic Disease Management support and education needs related to CKDIII, HTN, DMII  Nurse Case Manager Clinical Goal(s):  Marland Kitchen Over the next 90 days, patient will work with CCM RN and Nephrologist to address needs related to disease education and support to improve Self Health management of CKD  Interventions:  . Evaluation of current treatment plan related to CKD and patient's adherence to plan as established by provider. . Provided education to patient re: potential complications from uncontrolled diabetes and  High Blood Pressure resulting in kidney damage; educated patient on the stages of CKD and determined patient is being following by a Nephrologist for management and monitoring of this condition  . Discussed plans with patient for ongoing care management follow up and provided patient with direct contact information for care management team . Provided patient with printed educational materials related to the stages of Chronic Kidney Disease  Patient Self Care Activities:  . Self administers medications as prescribed . Attends all scheduled provider appointments . Calls pharmacy for medication refills . Calls provider office for new concerns or questions   Initial goal documentation        Plan:   Telephone follow up appointment with care management team member scheduled for: 06/04/19  Barb Merino, RN, BSN, CCM Care Management Coordinator [place] [facility]  Direct Phone: [phone]

## 2019-04-26 NOTE — Patient Instructions (Signed)
Visit Information  Goals Addressed      Patient Stated   . "I would like to keep my BP under good control" (pt-stated)       Current Barriers:  Marland Kitchen Knowledge Deficits related to disease process and Self Health management of HTN . Chronic Disease Management support and education needs related to HTN, DMII, CKDIII  Nurse Case Manager Clinical Goal(s):  Marland Kitchen Over the next 90 days, patient will work with the [place] CM and PCP to address needs related to disease education and support to maintain BP <130/80  Interventions:  . Evaluation of current treatment plan related to Hypertension and patient's adherence to plan as established by provider. . Provided education to patient re: target BP <130/80; discussed importance of self monitoring BP at home to check for consistency with BP; education provided about the importance of restricting Sodium and taking BP medications exactly as prescribed; education provided related to how uncontrolled HTN may cause kidney damage  . Reviewed medications with patient and discussed indication, dosage and frequency of BP meds and patient is adhering to taking his medications exactly as prescribed w/o missed doses . Discussed plans with patient for ongoing care management follow up and provided patient with direct contact information for care management team . Advised patient, providing education and rationale, to monitor blood pressure daily and record, calling the CCM team and or PCP for findings outside established parameters.  . Provided patient with printed educational materials related to Why Should I Restrict Sodium? Life's Simple 7  Patient Self Care Activities:  . Self administers medications as prescribed . Attends all scheduled provider appointments . Calls pharmacy for medication refills . Calls provider office for new concerns or questions   Initial goal documentation     . "I would like to lower my A1C" (pt-stated)       Current Barriers:   Marland Kitchen Knowledge Deficits related to disease process and Self Health management of DM . Chronic Disease Management support and education needs related to DMII, HTN, CKDIII  Nurse Case Manager Clinical Goal(s):  Marland Kitchen Over the next 90 days, patient will work with the [place] CM and PCP to address needs related to disease management and support for improved Self Health management of Diabetes . Over the next 90 days, patient will lower his A1C <9.4  Interventions:  . Evaluation of current treatment plan related to Diabetes and patient's adherence to plan as established by provider. . Provided education to patient re: target A1C of <7.0; reviewed and discussed most recent A1C of 9.4 obtained on 02/02/19; education provided on how to achieve this goal, including medication adherence, diet adherence, implementation of daily/weekly exercise . Reviewed medications with patient and discussed indication , dosage and frequency of diabetic medications and patient verbalizes understanding, he reports having good adherence without missed doses . Collaborated with Lottie Dawson Pharm D via in basket message requesting outreach to patient to review his current diabetic regimen to ensure no further recommendations are needed . Discussed plans with patient for ongoing care management follow up and provided patient with direct contact information for care management team . Provided patient with printed educational materials related to Diabetes Disease Management with Meal Planning, Counting Carbs, Diabetes Zone Safety Tool, signs/symptoms of hypo/hyperglycemia . Advised patient, providing education and rationale, to check cbg daily before meals and record, calling the PCP and or CCM team for findings outside established parameters.    Patient Self Care Activities:  . Attends all scheduled provider  appointments . Calls pharmacy for medication refills . Calls provider office for new concerns or questions   Initial goal  documentation     . "I would like to save my kidneys" (pt-stated)       Current Barriers:  Marland Kitchen Knowledge Deficits related to disease process and Self Health management of CKD . Chronic Disease Management support and education needs related to CKDIII, HTN, DMII  Nurse Case Manager Clinical Goal(s):  Marland Kitchen Over the next 90 days, patient will work with CCM RN and Nephrologist to address needs related to disease education and support to improve Self Health management of CKD  Interventions:  . Evaluation of current treatment plan related to CKD and patient's adherence to plan as established by provider. . Provided education to patient re: potential complications from uncontrolled diabetes and High Blood Pressure resulting in kidney damage; educated patient on the stages of CKD and determined patient is being following by a Nephrologist for management and monitoring of this condition  . Discussed plans with patient for ongoing care management follow up and provided patient with direct contact information for care management team . Provided patient with printed educational materials related to the stages of Chronic Kidney Disease  Patient Self Care Activities:  . Self administers medications as prescribed . Attends all scheduled provider appointments . Calls pharmacy for medication refills . Calls provider office for new concerns or questions  Initial goal documentation        The patient verbalized understanding of instructions provided today and declined a print copy of patient instruction materials.   Telephone follow up appointment with care management team member scheduled for: 06/04/19  Barb Merino, RN, BSN, CCM Care Management Coordinator [place] [facility]  Direct Phone: [phone]

## 2019-05-03 ENCOUNTER — Ambulatory Visit: Payer: Self-pay | Admitting: Pharmacist

## 2019-05-03 DIAGNOSIS — E1121 Type 2 diabetes mellitus with diabetic nephropathy: Secondary | ICD-10-CM

## 2019-05-05 ENCOUNTER — Other Ambulatory Visit: Payer: Self-pay

## 2019-05-05 ENCOUNTER — Ambulatory Visit (INDEPENDENT_AMBULATORY_CARE_PROVIDER_SITE_OTHER): Payer: Medicare (Managed Care) | Admitting: Nurse Practitioner

## 2019-05-05 ENCOUNTER — Encounter: Payer: Self-pay | Admitting: Nurse Practitioner

## 2019-05-05 VITALS — BP 122/84 | HR 66 | Temp 98.6°F | Ht 64.2 in | Wt 197.2 lb

## 2019-05-05 DIAGNOSIS — E1121 Type 2 diabetes mellitus with diabetic nephropathy: Secondary | ICD-10-CM

## 2019-05-05 DIAGNOSIS — I10 Essential (primary) hypertension: Secondary | ICD-10-CM

## 2019-05-05 DIAGNOSIS — Z1159 Encounter for screening for other viral diseases: Secondary | ICD-10-CM

## 2019-05-05 DIAGNOSIS — N529 Male erectile dysfunction, unspecified: Secondary | ICD-10-CM | POA: Diagnosis not present

## 2019-05-05 DIAGNOSIS — I129 Hypertensive chronic kidney disease with stage 1 through stage 4 chronic kidney disease, or unspecified chronic kidney disease: Secondary | ICD-10-CM

## 2019-05-05 DIAGNOSIS — L299 Pruritus, unspecified: Secondary | ICD-10-CM

## 2019-05-05 DIAGNOSIS — N1832 Chronic kidney disease, stage 3b: Secondary | ICD-10-CM

## 2019-05-05 DIAGNOSIS — Z1211 Encounter for screening for malignant neoplasm of colon: Secondary | ICD-10-CM

## 2019-05-05 MED ORDER — SILDENAFIL CITRATE 20 MG PO TABS: ORAL_TABLET | ORAL | 0 refills | Status: DC

## 2019-05-05 MED ORDER — RYBELSUS 7 MG PO TABS: 1.0000 | ORAL_TABLET | Freq: Every day | ORAL | 3 refills | Status: DC

## 2019-05-05 NOTE — Progress Notes (Signed)
Subjective:     Patient ID: [name] , male    DOB: April 23, 1955 , 64 y.o.   MRN: [id number]   Chief Complaint  Patient presents with  . Diabetes    HPI  Reports having a problem with initiating intercourse and maintaining   Diabetes He presents for his follow-up diabetic visit. He has type 2 diabetes mellitus. His disease course has been stable. There are no hypoglycemic associated symptoms. Pertinent negatives for hypoglycemia include no dizziness, headaches or nervousness/anxiousness. Pertinent negatives for diabetes include no chest pain, no polydipsia, no polyphagia and no polyuria. There are no hypoglycemic complications. Symptoms are worsening. There are no diabetic complications. Risk factors for coronary artery disease include obesity, male sex, sedentary lifestyle and diabetes mellitus. Current diabetic treatment includes oral agent (dual therapy) (tolerating Rybelsus and Tradjenta well. ). He is compliant with treatment all of the time. He is following a generally unhealthy diet. When asked about meal planning, he reported none. He has not had a previous visit with a dietitian. He rarely participates in exercise. (Average blood sugar of 180's) An ACE inhibitor/angiotensin II receptor blocker is being taken. He does not see a podiatrist.Eye exam is not current.      Past Medical History:  Diagnosis Date  . Closed nondisplaced spiral fracture of shaft of right tibia   . Complicated urinary tract infection 09/06/2016  . Depression   . Diabetes mellitus without complication ([place])   . GERD (gastroesophageal reflux disease)   . Gout    right foot  . Hemorrhoids   . Hypertension   . Hypertension   . Macular degeneration   . Microcytic anemia   . Prostate cancer ([place]) 08/2009  . Rectal bleeding 07/31/2016  . Tubular adenoma      Family History  Problem Relation Age of Onset  . Lung cancer Mother        Deceased  . Throat cancer Brother   . Pancreatic cancer Father   .  Heart disease Father        Deceased  . Lung cancer Maternal Uncle        nephew  . Diabetes Other      Current Outpatient Medications:  .  acetaminophen (TYLENOL) 500 MG tablet, Take 500 mg by mouth every 6 (six) hours as needed for mild pain or moderate pain., Disp: , Rfl:  .  atorvastatin (LIPITOR) 20 MG tablet, TAKE 1 TABLET BY MOUTH EVERY DAY, Disp: 90 tablet, Rfl: 1 .  guaiFENesin (MUCINEX) 600 MG 12 hr tablet, Take 1 tablet (600 mg total) by mouth 2 (two) times daily., Disp: 60 tablet, Rfl: 2 .  metoprolol tartrate (LOPRESSOR) 50 MG tablet, TAKE 1 TABLET BY MOUTH EVERY DAY, Disp: 90 tablet, Rfl: 1 .  Multiple Vitamin (MULTIVITAMIN) tablet, Take 1 tablet by mouth daily., Disp: , Rfl:  .  MYRBETRIQ 25 MG TB24 tablet, TAKE 1 TABLET BY MOUTH EVERY DAY, Disp: 30 tablet, Rfl: 1 .  olmesartan-hydrochlorothiazide (BENICAR HCT) 40-25 MG tablet, Take 1 tablet by mouth daily., Disp: 90 tablet, Rfl: 1 .  RYBELSUS 7 MG TABS, TAKE 1 TABLET BY MOUTH DAILY. TAKE 30 MINUTES BEFORE BREAKFAST, Disp: 30 tablet, Rfl: 0 .  TRADJENTA 5 MG TABS tablet, TAKE 1 TABLET BY MOUTH EVERY DAY, Disp: 90 tablet, Rfl: 0 .  traMADol (ULTRAM) 50 MG tablet, Take 1 tablet (50 mg total) by mouth every 6 (six) hours as needed for severe pain., Disp: 15 tablet, Rfl: 0 .  traZODone (  DESYREL) 50 MG tablet, TAKE 1 TABLET BY MOUTH EVERYDAY AT BEDTIME, Disp: 90 tablet, Rfl: 1 .  aspirin EC 81 MG tablet, Take 81 mg by mouth daily., Disp: , Rfl:    No Known Allergies   Review of Systems  Constitutional: Negative.   Respiratory: Negative.  Negative for cough.   Cardiovascular: Negative for chest pain, palpitations and leg swelling.  Endocrine: Negative for polydipsia, polyphagia and polyuria.  Neurological: Negative for dizziness and headaches.  Psychiatric/Behavioral: Negative.  The patient is not nervous/anxious.      Today's Vitals   05/05/19 0931  BP: 122/84  Pulse: 66  Temp: 98.6 F (37 C)  TempSrc: Oral  Weight:  197 lb 3.2 oz (89.4 kg)  Height: 5' 4.2" (1.631 m)   Body mass index is 33.64 kg/m.   Objective:  Physical Exam Constitutional:      General: He is not in acute distress.    Appearance: Normal appearance. He is obese.  HENT:     Right Ear: Tympanic membrane, ear canal and external ear normal. There is no impacted cerumen.     Left Ear: Tympanic membrane, ear canal and external ear normal. There is no impacted cerumen.  Cardiovascular:     Rate and Rhythm: Normal rate and regular rhythm.     Pulses: Normal pulses.     Heart sounds: Normal heart sounds. No murmur.  Pulmonary:     Effort: Pulmonary effort is normal. No respiratory distress.     Breath sounds: Normal breath sounds.  Skin:    Capillary Refill: Capillary refill takes less than 2 seconds.  Neurological:     General: No focal deficit present.     Mental Status: He is alert and oriented to person, place, and time.  Psychiatric:        Mood and Affect: Mood normal.        Behavior: Behavior normal.        Thought Content: Thought content normal.        Judgment: Judgment normal.         Assessment And Plan:     1. Type 2 diabetes mellitus with diabetic nephropathy, without long-term current use of insulin (HCC)  Reports he is tolerating rybelsus well  Will check HgbA1c. - Lipid panel - Hemoglobin A1c - Semaglutide (RYBELSUS) 7 MG TABS; Take 1 tablet by mouth daily. Take 30 minutes before breakfast  Dispense: 30 tablet; Refill: 3  2. Stage 3b chronic kidney disease  Continue avoiding medications to affect kidney functions  Stay well hydrated with water daily  - CMP14+EGFR - Hemoglobin A1c - Semaglutide (RYBELSUS) 7 MG TABS; Take 1 tablet by mouth daily. Take 30 minutes before breakfast  Dispense: 30 tablet; Refill: 3  3. Essential hypertension . B/P is controlled.  . CMP ordered to check renal function.  . The importance of regular exercise and dietary modification was stressed to the patient.  -  CMP14+EGFR  4. Encounter for hepatitis C screening test for low risk patient  Will check for Hepatitis C screening due to being born between the years 48-1967 - Hepatitis C antibody  5. Erectile dysfunction, unspecified erectile dysfunction type  Will send for sildenafil pending insurance approval  Discussed if has chest pain he is to inform me or another provider - sildenafil (REVATIO) 20 MG tablet; Take 1 tablet by mouth daily  Dispense: 30 tablet; Refill: 0  6. Ear itching  No abnormalities noted in his ear  Encouraged to use mineral  oil to ear as  needed  7. Encounter for screening colonoscopy  According to USPTF Colorectal cancer Screening guidelines. Colonoscopy is recommended every 10 years, starting at age 64years.  Will refer to GI for colon cancer screening. - Ambulatory referral to Gastroenterology'        [name], [name]    THE PATIENT IS ENCOURAGED TO PRACTICE SOCIAL DISTANCING DUE TO THE COVID-19 PANDEMIC.

## 2019-05-06 ENCOUNTER — Other Ambulatory Visit: Payer: Self-pay | Admitting: Nurse Practitioner

## 2019-05-06 DIAGNOSIS — E1121 Type 2 diabetes mellitus with diabetic nephropathy: Secondary | ICD-10-CM

## 2019-05-06 DIAGNOSIS — N1832 Chronic kidney disease, stage 3b: Secondary | ICD-10-CM

## 2019-05-06 LAB — LIPID PANEL
Chol/HDL Ratio: 4.1 ratio (ref 0.0–5.0)
Cholesterol, Total: 147 mg/dL (ref 100–199)
HDL: 36 mg/dL — ABNORMAL LOW (ref >39)
LDL Chol Calc (NIH): 75 mg/dL (ref 0–99)
Triglycerides: 215 mg/dL — ABNORMAL HIGH (ref 0–149)
VLDL Cholesterol Cal: 36 mg/dL (ref 5–40)

## 2019-05-06 LAB — CMP14+EGFR
ALT: 46 IU/L — ABNORMAL HIGH (ref 0–44)
AST: 27 IU/L (ref 0–40)
Albumin/Globulin Ratio: 1.4 (ref 1.2–2.2)
Albumin: 4.2 g/dL (ref 3.8–4.8)
Alkaline Phosphatase: 159 IU/L — ABNORMAL HIGH (ref 39–117)
BUN/Creatinine Ratio: 16 (ref 10–24)
BUN: 27 mg/dL (ref 8–27)
Bilirubin Total: 0.4 mg/dL (ref 0.0–1.2)
CO2: 22 mmol/L (ref 20–29)
Calcium: 9.4 mg/dL (ref 8.6–10.2)
Chloride: 98 mmol/L (ref 96–106)
Creatinine, Ser: 1.68 mg/dL — ABNORMAL HIGH (ref 0.76–1.27)
GFR calc Af Amer: 49 mL/min/{1.73_m2} — ABNORMAL LOW (ref >59)
GFR calc non Af Amer: 42 mL/min/{1.73_m2} — ABNORMAL LOW (ref >59)
Globulin, Total: 3 g/dL (ref 1.5–4.5)
Glucose: 316 mg/dL — ABNORMAL HIGH (ref 65–99)
Potassium: 4.4 mmol/L (ref 3.5–5.2)
Sodium: 137 mmol/L (ref 134–144)
Total Protein: 7.2 g/dL (ref 6.0–8.5)

## 2019-05-06 LAB — HEMOGLOBIN A1C
Est. average glucose Bld gHb Est-mCnc: 217 mg/dL
Hgb A1c MFr Bld: 9.2 % — ABNORMAL HIGH (ref 4.8–5.6)

## 2019-05-06 LAB — HEPATITIS C ANTIBODY: Hep C Virus Ab: 0.1 s/co ratio (ref 0.0–0.9)

## 2019-05-06 MED ORDER — RYBELSUS 14 MG PO TABS: 1.0000 | ORAL_TABLET | Freq: Every day | ORAL | 3 refills | Status: DC

## 2019-05-06 NOTE — Progress Notes (Signed)
I am increasing his Rybelsus not Trulicity.

## 2019-05-06 NOTE — Progress Notes (Signed)
No I meant to say Rybelsus

## 2019-05-10 ENCOUNTER — Telehealth: Payer: Self-pay

## 2019-05-10 ENCOUNTER — Ambulatory Visit: Payer: Self-pay | Admitting: Pharmacist

## 2019-05-10 DIAGNOSIS — E1121 Type 2 diabetes mellitus with diabetic nephropathy: Secondary | ICD-10-CM

## 2019-05-10 DIAGNOSIS — I10 Essential (primary) hypertension: Secondary | ICD-10-CM

## 2019-05-10 NOTE — Telephone Encounter (Signed)
Patient's PA for sildenafil has been submitted through covermymeds and we are waiting for the determination from his ins. YRL,RMA

## 2019-05-10 NOTE — Progress Notes (Signed)
  Chronic Care Management   Outreach Note  05/03/2019 Name: [name] MRN: [id number] DOB: Jul 25, 1955  Referred by: [name], [name] Reason for referral : Chronic Care Management   An unsuccessful telephone outreach was attempted today. The patient was referred to the case management team for assistance with care management and care coordination.   Follow Up Plan: A HIPPA compliant phone message was left for the patient providing contact information and requesting a return call.  The care management team will reach out to the patient again over the next 7 days.   SIGNATURE Regina Eck, PharmD, BCPS Clinical Pharmacist, [facility] [place]: [phone]

## 2019-05-10 NOTE — Telephone Encounter (Signed)
Patient called stating the pharmacy said he sildenafil needs a PA I advised him that it has been submitted and we are just waiting for a response from his insurance. YRL,RMA

## 2019-05-11 DIAGNOSIS — N138 Other obstructive and reflux uropathy: Secondary | ICD-10-CM | POA: Diagnosis not present

## 2019-05-11 DIAGNOSIS — N529 Male erectile dysfunction, unspecified: Secondary | ICD-10-CM | POA: Diagnosis not present

## 2019-05-11 DIAGNOSIS — N401 Enlarged prostate with lower urinary tract symptoms: Secondary | ICD-10-CM | POA: Diagnosis not present

## 2019-05-11 DIAGNOSIS — C61 Malignant neoplasm of prostate: Secondary | ICD-10-CM | POA: Diagnosis not present

## 2019-05-11 DIAGNOSIS — R8290 Unspecified abnormal findings in urine: Secondary | ICD-10-CM | POA: Diagnosis not present

## 2019-05-12 ENCOUNTER — Telehealth: Payer: Self-pay

## 2019-05-12 NOTE — Telephone Encounter (Signed)
Patient's sildenafil has been denied he needs to schedule an appointment with his urologist to see if they can get the medication covered. I have left  Pt a v/m to call the office [facility]

## 2019-05-13 NOTE — Telephone Encounter (Signed)
Patient called in and stated a PA needs to be processed for sildenafil advised patient of information below, patient has a appt with Urologist next week.

## 2019-05-14 NOTE — Patient Instructions (Signed)
Visit Information  Goals Addressed            This Visit's Progress     Patient Stated   . "I would like to lower my A1C" (pt-stated)       Current Barriers:  Marland Kitchen Knowledge Deficits related to disease process and Self Health management of DM . Chronic Disease Management support and education needs related to DMII, HTN, CKDIII  Nurse Case Manager Clinical Goal(s):  Marland Kitchen Over the next 90 days, patient will work with the [place] CM and PCP to address needs related to disease management and support for improved Self Health management of Diabetes . Over the next 90 days, patient will lower his A1C <9.4  Interventions:  . Evaluation of current treatment plan related to Diabetes and patient's adherence to plan as established by provider. . Provided education to patient re: target A1C of <7.0; reviewed and discussed most recent A1C of 9.4 obtained on 02/02/19; education provided on how to achieve this goal, including medication adherence, diet adherence, implementation of daily/weekly exercise . Reviewed medications with patient and discussed indication , dosage and frequency of diabetic medications and patient verbalizes understanding, he reports having good adherence without missed doses . Collaborated with Lottie Dawson Pharm D via in basket message requesting outreach to patient to review his current diabetic regimen to ensure no further recommendations are needed . Discussed plans with patient for ongoing care management follow up and provided patient with direct contact information for care management team . Provided patient with printed educational materials related to Diabetes Disease Management with Meal Planning, Counting Carbs, Diabetes Zone Safety Tool, signs/symptoms of hypo/hyperglycemia . Advised patient, providing education and rationale, to check cbg daily before meals and record, calling the PCP and or CCM team for findings outside established parameters.    PharmD  interventions: Completed call with patient on 05/10/19 -Patient started on GLP-1 BY MOUTH (Rybelsus).  He is to take this with a full glass of water in the AM 2-3 hrs separate from other medications.  -Patient is not checking Bgs at home.  I strongly encouraged patient to check Bgs at home in order for [place] to be able to adjust his medications for optimal outcomes -Metformin has been discontinued by nephrologist due to kidneys -He remains on Tradjenta for diabetes (this medication does not require renal adjustments)  Patient Self Care Activities:  . Attends all scheduled provider appointments . Calls pharmacy for medication refills . Calls provider office for new concerns or questions   Please see past updates related to this goal by clicking on the "Past Updates" button in the selected goal         The patient verbalized understanding of instructions provided today and declined a print copy of patient instruction materials.   The care management team will reach out to the patient again over the next 30 days.   SIGNATURE Regina Eck, PharmD, BCPS Clinical Pharmacist, [facility] [place]: [phone]

## 2019-05-14 NOTE — Progress Notes (Signed)
Chronic Care Management   Visit Note  05/10/2019 Name: [name] MRN: [id number] DOB: Aug 05, 1955  Referred by: Randy Brine, [name] Reason for referral : Chronic Care Management (Diabetes)   [name] is a 64 y.o. year old male who is a primary care patient of [name], [place]. The CCM team was consulted for assistance with chronic disease management and care coordination needs related to DMII  Review of patient status, including review of consultants reports, relevant laboratory and other test results, and collaboration with appropriate care team members and the patient's provider was performed as part of comprehensive patient evaluation and provision of chronic care management services.    SDOH (Social Determinants of Health) assessments performed: No See Care Plan activities for detailed interventions related to SDOH)     Medications: Outpatient Encounter Medications as of 05/10/2019  Medication Sig  . acetaminophen (TYLENOL) 500 MG tablet Take 500 mg by mouth every 6 (six) hours as needed for mild pain or moderate pain.  Marland Kitchen aspirin EC 81 MG tablet Take 81 mg by mouth daily.  Marland Kitchen atorvastatin (LIPITOR) 20 MG tablet TAKE 1 TABLET BY MOUTH EVERY DAY  . guaiFENesin (MUCINEX) 600 MG 12 hr tablet Take 1 tablet (600 mg total) by mouth 2 (two) times daily.  . metoprolol tartrate (LOPRESSOR) 50 MG tablet TAKE 1 TABLET BY MOUTH EVERY DAY  . Multiple Vitamin (MULTIVITAMIN) tablet Take 1 tablet by mouth daily.  Marland Kitchen MYRBETRIQ 25 MG TB24 tablet TAKE 1 TABLET BY MOUTH EVERY DAY  . olmesartan-hydrochlorothiazide (BENICAR HCT) 40-25 MG tablet Take 1 tablet by mouth daily.  . Semaglutide (RYBELSUS) 14 MG TABS Take 1 tablet by mouth daily. 30 minutes before breakfast  . sildenafil (REVATIO) 20 MG tablet Take 1 tablet by mouth daily  . TRADJENTA 5 MG TABS tablet TAKE 1 TABLET BY MOUTH EVERY DAY  . traMADol (ULTRAM) 50 MG tablet Take 1 tablet (50 mg total) by mouth every 6 (six) hours as needed for  severe pain.  . traZODone (DESYREL) 50 MG tablet TAKE 1 TABLET BY MOUTH EVERYDAY AT BEDTIME   No facility-administered encounter medications on file as of 05/10/2019.     Objective:   Goals Addressed            This Visit's Progress     Patient Stated   . "I would like to lower my A1C" (pt-stated)       Current Barriers:  Marland Kitchen Knowledge Deficits related to disease process and Self Health management of DM . Chronic Disease Management support and education needs related to DMII, HTN, CKDIII  Nurse Case Manager Clinical Goal(s):  Marland Kitchen Over the next 90 days, patient will work with the [place] CM and PCP to address needs related to disease management and support for improved Self Health management of Diabetes . Over the next 90 days, patient will lower his A1C <9.4  Interventions:  . Evaluation of current treatment plan related to Diabetes and patient's adherence to plan as established by provider. . Provided education to patient re: target A1C of <7.0; reviewed and discussed most recent A1C of 9.4 obtained on 02/02/19; education provided on how to achieve this goal, including medication adherence, diet adherence, implementation of daily/weekly exercise . Reviewed medications with patient and discussed indication , dosage and frequency of diabetic medications and patient verbalizes understanding, he reports having good adherence without missed doses . Collaborated with [name] Pharm D via in basket message requesting outreach to patient to review his  current diabetic regimen to ensure no further recommendations are needed . Discussed plans with patient for ongoing care management follow up and provided patient with direct contact information for care management team . Provided patient with printed educational materials related to Diabetes Disease Management with Meal Planning, Counting Carbs, Diabetes Zone Safety Tool, signs/symptoms of hypo/hyperglycemia . Advised patient, providing  education and rationale, to check cbg daily before meals and record, calling the PCP and or CCM team for findings outside established parameters.    PharmD interventions: Completed call with patient on 05/10/19 -Patient started on GLP-1 BY MOUTH (Rybelsus).  He is to take this with a full glass of water in the AM 2-3 hrs separate from other medications.  -Patient is not checking Bgs at home.  I strongly encouraged patient to check Bgs at home in order for [name] to be able to adjust his medications for optimal outcomes -Metformin has been discontinued by nephrologist due to kidneys -He remains on Tradjenta for diabetes (this medication does not require renal adjustments)  Patient Self Care Activities:  . Attends all scheduled provider appointments . Calls pharmacy for medication refills . Calls provider office for new concerns or questions   Please see past updates related to this goal by clicking on the "Past Updates" button in the selected goal          Plan:   The care management team will reach out to the patient again over the next 30 days.   Provider Signature Regina Eck, PharmD, BCPS Clinical Pharmacist, [facility] [place]: [phone]

## 2019-06-04 ENCOUNTER — Telehealth: Payer: Self-pay

## 2019-06-07 DIAGNOSIS — N130 Hydronephrosis with ureteropelvic junction obstruction: Secondary | ICD-10-CM | POA: Diagnosis not present

## 2019-06-11 DIAGNOSIS — L649 Androgenic alopecia, unspecified: Secondary | ICD-10-CM | POA: Diagnosis not present

## 2019-06-11 DIAGNOSIS — L298 Other pruritus: Secondary | ICD-10-CM | POA: Diagnosis not present

## 2019-06-11 DIAGNOSIS — L308 Other specified dermatitis: Secondary | ICD-10-CM | POA: Diagnosis not present

## 2019-06-11 DIAGNOSIS — L570 Actinic keratosis: Secondary | ICD-10-CM | POA: Diagnosis not present

## 2019-06-11 DIAGNOSIS — L304 Erythema intertrigo: Secondary | ICD-10-CM | POA: Diagnosis not present

## 2019-06-11 DIAGNOSIS — Z85828 Personal history of other malignant neoplasm of skin: Secondary | ICD-10-CM | POA: Diagnosis not present

## 2019-06-12 ENCOUNTER — Other Ambulatory Visit: Payer: Self-pay | Admitting: Nurse Practitioner

## 2019-06-12 DIAGNOSIS — N529 Male erectile dysfunction, unspecified: Secondary | ICD-10-CM

## 2019-06-14 ENCOUNTER — Other Ambulatory Visit: Payer: Self-pay | Admitting: Nurse Practitioner

## 2019-06-14 DIAGNOSIS — R32 Unspecified urinary incontinence: Secondary | ICD-10-CM

## 2019-06-14 DIAGNOSIS — R350 Frequency of micturition: Secondary | ICD-10-CM

## 2019-06-29 ENCOUNTER — Other Ambulatory Visit: Payer: Self-pay | Admitting: Nurse Practitioner

## 2019-07-02 ENCOUNTER — Other Ambulatory Visit: Payer: Self-pay | Admitting: Nurse Practitioner

## 2019-07-02 DIAGNOSIS — N529 Male erectile dysfunction, unspecified: Secondary | ICD-10-CM

## 2019-07-02 NOTE — Telephone Encounter (Signed)
Sildenafil refill  

## 2019-07-12 ENCOUNTER — Other Ambulatory Visit: Payer: Self-pay | Admitting: Nurse Practitioner

## 2019-07-14 ENCOUNTER — Other Ambulatory Visit: Payer: Self-pay | Admitting: Adult Health

## 2019-07-14 ENCOUNTER — Other Ambulatory Visit (HOSPITAL_COMMUNITY): Payer: Self-pay | Admitting: Adult Health

## 2019-07-14 ENCOUNTER — Telehealth: Payer: Self-pay | Admitting: Nurse Practitioner

## 2019-07-14 DIAGNOSIS — N1330 Unspecified hydronephrosis: Secondary | ICD-10-CM

## 2019-07-14 NOTE — Chronic Care Management (AMB) (Signed)
  Care Management   Note  07/14/2019 Name: [name] MRN: [id number] DOB: [birth year]  [name] is a 63 y.o. year old male who is a primary care patient of [name], [place] and is actively engaged with the care management team. I reached out to [name] by phone today to assist with scheduling an initial visit with the Pharmacist in response to referral sent by [employer] health plan.  Follow up plan: Telephone appointment with care management team member scheduled for:07/22/2019.  [place], [place] [address] Direct Dial: [phone] [email or web address]: [email or web address]

## 2019-07-15 ENCOUNTER — Telehealth: Payer: Self-pay | Admitting: Nurse Practitioner

## 2019-07-15 NOTE — Chronic Care Management (AMB) (Signed)
  Care Management   Note  07/15/2019 Name: [name] MRN: [id number] DOB: October 13, 1955  [name] is a 64 y.o. year old male who is a primary care patient of [name], [place] and is actively engaged with the care management team. I reached out to [name] by phone today to assist with re-scheduling an initial visit with the Pharmacist  Follow up plan: Telephone appointment with care management team member scheduled for:07/27/2019  [place], [place] Management  [place], [place] [address] Direct Dial: [email or web address] Website: [email or web address]

## 2019-07-20 ENCOUNTER — Other Ambulatory Visit: Payer: Self-pay | Admitting: Nurse Practitioner

## 2019-07-20 DIAGNOSIS — N529 Male erectile dysfunction, unspecified: Secondary | ICD-10-CM

## 2019-07-22 ENCOUNTER — Telehealth: Payer: Medicare (Managed Care)

## 2019-07-26 ENCOUNTER — Encounter: Payer: Self-pay | Admitting: Gastroenterology

## 2019-07-27 ENCOUNTER — Other Ambulatory Visit: Payer: Self-pay

## 2019-07-27 ENCOUNTER — Ambulatory Visit: Payer: Medicare (Managed Care)

## 2019-07-27 DIAGNOSIS — E1121 Type 2 diabetes mellitus with diabetic nephropathy: Secondary | ICD-10-CM

## 2019-07-27 DIAGNOSIS — I10 Essential (primary) hypertension: Secondary | ICD-10-CM

## 2019-07-27 NOTE — Chronic Care Management (AMB) (Signed)
Chronic Care Management Pharmacy  Name: [name]  MRN: [id number] DOB: 1955/09/05  Chief Complaint/ HPI  [name],  64 y.o. , male presents for their Initial CCM visit with the clinical pharmacist via telephone due to COVID-19 Pandemic.  PCP : [name], [name]  Their chronic conditions include: Hypertension, Diabetes, CKD, Dyslipidemia, Depressive disorder, Insomnia   Office Visits: 05/05/19 OV: Follow up diabetic visit. Labs ordered (HgbA1c, Lipid panel, CMP14+EGFR). Recommended to remain hydrated to help kidney function. Diet and exercise education provided. Started sildenafil '20mg'$  daily for erectile dysfunction.  03/25/19 Telemed visit: Presented for wheezing/non-productive cough over the past week. Started Mucinex as needed for chest congestion  02/02/19 OV: Follow up diabetic visit. BG was 226 in office. Diabetes is poorly controlled, started on Rybelsus '7mg'$  daily. Labs ordered (HgbA1c, CMP14+EGFR). Referred to CCM RN for diabetes education. Stage 3b CKD  Consult Visit: 05/12/19 Telephone call: Sildenafil PA was denied by the insurance  05/11/19 OV w/ Urology w/ Dr. 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 Restart/ Continue alfuzosin '10mg'$  daily. PSA (13.03 H)and urine culture ordered. Follow up in 4 months.Previously on Rapaflo for LUTS. Myrbetriq started for urgency and urge incontinence.   CCM Encounters: 05/10/19 PharmD: Encouraged pt to check BG at home. Metformin was d/c by nephrologist due to kidney function. Counseled pt on correct administration of Rybelsus.   04/22/19 RN: Care plan established for chronic disease states.   03/05/19 SW: CCM consent obtained  Medications: Outpatient Encounter Medications as of 07/27/2019  Medication Sig  . acetaminophen (TYLENOL) 500 MG tablet Take 500 mg by mouth every 6 (six) hours as needed for mild pain or moderate pain.  Marland Kitchen aspirin EC 81 MG tablet Take 81 mg by  mouth daily.  Marland Kitchen atorvastatin (LIPITOR) 20 MG tablet TAKE 1 TABLET BY MOUTH EVERY DAY  . guaiFENesin (MUCINEX) 600 MG 12 hr tablet Take 1 tablet (600 mg total) by mouth 2 (two) times daily.  . metoprolol tartrate (LOPRESSOR) 50 MG tablet TAKE 1 TABLET BY MOUTH EVERY DAY  . Multiple Vitamin (MULTIVITAMIN) tablet Take 1 tablet by mouth daily.  Marland Kitchen olmesartan-hydrochlorothiazide (BENICAR HCT) 40-25 MG tablet TAKE 1 TABLET BY MOUTH EVERY DAY  . Semaglutide (RYBELSUS) 14 MG TABS Take 1 tablet by mouth daily. 30 minutes before breakfast  . sildenafil (REVATIO) 20 MG tablet TAKE 1 TABLET BY MOUTH EVERY DAY (PRIOR AUTHORIZATION DENIED)  . TRADJENTA 5 MG TABS tablet TAKE 1 TABLET BY MOUTH EVERY DAY  . traMADol (ULTRAM) 50 MG tablet Take 1 tablet (50 mg total) by mouth every 6 (six) hours as needed for severe pain.  . traZODone (DESYREL) 50 MG tablet TAKE 1 TABLET BY MOUTH EVERYDAY AT BEDTIME  . [DISCONTINUED] MYRBETRIQ 25 MG TB24 tablet TAKE 1 TABLET BY MOUTH EVERY DAY   No facility-administered encounter medications on file as of 07/27/2019.   Current Diagnosis/Assessment: SDOH Interventions     Most Recent Value  SDOH Interventions  Financial Strain Interventions  Other (Comment) [Will help with patient assistance as needed]      Goals Addressed            This Visit's Progress   . Pharmacy Care Plan       CARE PLAN ENTRY  Current Barriers:  . Chronic Disease Management support, education, and care coordination needs related to Hypertension, Hyperlipidemia, Diabetes, and Chronic Kidney Disease   Hypertension . Pharmacist Clinical Goal(s): o Over the next 90 days,  patient will work with PharmD and providers to maintain BP goal <130/80 . Current regimen:  o Metoprolol tartrate '50mg'$  daily o Olmesartan/ HCTZ 40/'25mg'$  daily . Interventions: o Provided dietary and exercise recommendations . Patient self care activities - Over the next 90 days, patient will: o Check BP weekly, document, and  provide at future appointments o Ensure daily salt intake < 2300 mg/day o Choose low-sodium/no-sodium canned goods o Exercise 30 minutes 5 times weekly  Diabetes . Pharmacist Clinical Goal(s): o Over the next 90 days, patient will work with PharmD and providers to achieve A1c goal <7% . Current regimen:  o Rybelsus '14mg'$  daily 30 minutes before breakfast o Tradjenta '5mg'$  daily . Interventions: o Discuss with PCP changing Tradjenta to [place] for additional kidney protective benefits and blood glucose lower . Patient self care activities - Over the next 90 days, patient will: o Check blood sugar once daily, document, and provide at future appointments o Contact provider with any episodes of hypoglycemia  Medication management . Pharmacist Clinical Goal(s): o Over the next 90 days, patient will work with PharmD and providers to achieve optimal medication adherence . Current [facility] . Interventions o Comprehensive medication review performed. o Continue current medication management strategy . Patient self care activities - Over the next 90 days, patient will: o Focus on medication adherence by using pill box o Consider medication synchronization and pill packaging program as outlined be PharmD o Take medications as prescribed o Report any questions or concerns to PharmD and/or provider(s)  Initial goal documentation        Diabetes   Recent Relevant Labs: Lab Results  Component Value Date/Time   HGBA1C 9.2 (H) 05/05/2019 10:28 AM   HGBA1C 9.4 (H) 02/02/2019 10:40 AM   HGBA1C 7.6 06/26/2016 12:00 AM   HGBA1C 7.6 06/26/2016 12:00 AM   MICROALBUR 150 03/26/2018 11:57 AM   MICROALBUR 2.18 (H) 08/18/2008 09:21 PM   Kidney Function Lab Results  Component Value Date/Time   CREATININE 1.68 (H) 05/05/2019 10:28 AM   CREATININE 1.69 (H) 02/02/2019 10:40 AM   GFRNONAA 42 (L) 05/05/2019 10:28 AM   GFRAA 49 (L) 05/05/2019 10:28 AM  Stage 3b CKD   Checking BG: Twice  Weekly  Recent FBG Readings: 180-200 Patient has failed these meds in past: Metformin Patient is currently uncontrolled on the following medications:  -Rybelsus '14mg'$  daily 30 minutes before breakfast -Tradjenta '5mg'$  daily  Last diabetic Foot exam: Not on file Last diabetic Eye exam: 06/17/19, no retinopathy, macular atrophy  We discussed:  -Diet extensively  Breakfast: cereal, egg sandwich, coffee, bacon  Other meals: Pork chop, broccoli, brown rice, mixed vegetables,  turkey/lettuce/tomato sandwich, canned soup  Pt reports drinking 3-4 bottles of water daily, drinks no sweet tea  Recommend pt increase water consumption to 64oz daily -Exercise extensively  Pt reports exercising a couple days a week  Recommend 30 minutes of moderate- intensity exercise 5 times weekly  Silver Sneakers at the [facility] would be a good option -Pt reports losing 8-9 lbs after starting Rybelsus -Pt agreed to check BG daily for the next 4 weeks -Consider Farxiga as a replacement for Palmer -Continue current medications  -Discuss changing Tradjenta to [place] for additional kidney protective benefits and BG lowering  Hypertension   Office blood pressures are  BP Readings from Last 3 Encounters:  05/05/19 122/84  02/02/19 110/76  11/27/18 (!) 160/110   Patient has failed these meds in the past: Amlodipine, diltiazem ER, losartan/HCTZ, Metoprolol succinate,  olmesartan Patient is currently controlled on the following medications:  -Metoprolol tartrate '50mg'$  daily -Olmesartan/ HCTZ 40/'25mg'$  daily  Patient checks BP at home infrequently  Patient home BP readings are ranging: 120/84  We discussed: -Diet and exercise extensively  Pt states that he does not add extra salt to food  Recommend pt use low sodium/no sodium added canned goods -Pt states that he has been unable to obtain one of his BP meds from [facility] and has been out for a few days. Offered to call [facility] to coordinate refill, but pt stated he  would call  Plan -Continue current medications   Hyperlipidemia   Lipid Panel     Component Value Date/Time   CHOL 147 05/05/2019 1028   TRIG 215 (H) 05/05/2019 1028   HDL 36 (L) 05/05/2019 1028   CHOLHDL 4.1 05/05/2019 1028   CHOLHDL 6.2 04/19/2008 0430   VLDL 26 04/19/2008 0430   LDLCALC 75 05/05/2019 1028   LABVLDL 36 05/05/2019 1028     The 10-year ASCVD risk score Mikey Bussing DC Jr., et al., 2013) is: 33.6%   Values used to calculate the score:     Age: 13 years     Sex: Male     Is Non-Hispanic African American: No     Diabetic: Yes     Tobacco smoker: No     Systolic Blood Pressure: 014 mmHg     Is BP treated: Yes     HDL Cholesterol: 36 mg/dL     Total Cholesterol: 147 mg/dL   Patient has failed these meds in past: N/A Patient is currently uncontrolled on the following medications:  -Atorvastatin '20mg'$  daily -Aspirin 81 mg daily  We discussed:   -Diet and exercise extensively  Plan -Continue current medications  Insomnia   Patient has failed these meds in past: Zolpidem Patient is currently controlled on the following medications:  -Trazodone '50mg'$  daily at bedtime  We discussed: N/A   Plan -Continue current medications  Overactive Bladder    Patient has failed these meds in past: N/A Patient is currently controlled on the following medications:  -Myrbetriq '25mg'$  daily  We discussed: N/A  Plan -Continue current medications   Vaccines   Plan Will review vaccine history at follow up  Medication Management   [facility] Uses pill box? No - Will discuss at follow up Pt endorses 100% compliance  We discussed:  -Importance of medication adherence -Will perform comprehensive medication review at follow up, pt did not have medications with him today  Plan -Continue current medication management strategy   Follow up: 1 month phone visit  Jannette Fogo, PharmD Clinical Pharmacist [facility] [phone]

## 2019-07-28 ENCOUNTER — Ambulatory Visit (HOSPITAL_COMMUNITY)
Admission: RE | Admit: 2019-07-28 | Discharge: 2019-07-28 | Disposition: A | Discharge status: Home / Self Care | Payer: Medicare (Managed Care) | Source: Ambulatory Visit | Attending: Adult Health | Admitting: Adult Health

## 2019-07-28 DIAGNOSIS — N1330 Unspecified hydronephrosis: Secondary | ICD-10-CM | POA: Diagnosis not present

## 2019-07-28 MED ORDER — TECHNETIUM TC 99M MERTIATIDE
5.0200 | Freq: Once | INTRAVENOUS | Status: AC
  Administered 2019-07-28: 5.02 via INTRAVENOUS

## 2019-07-28 MED ORDER — FUROSEMIDE 10 MG/ML IJ SOLN: 45.0000 mg | Freq: Once | INTRAMUSCULAR | Status: AC

## 2019-07-28 MED ORDER — FUROSEMIDE 10 MG/ML IJ SOLN
INTRAMUSCULAR | Status: AC
  Administered 2019-07-28: 45 mg via INTRAVENOUS
  Filled 2019-07-28: qty 8

## 2019-08-02 ENCOUNTER — Other Ambulatory Visit (HOSPITAL_COMMUNITY): Payer: Self-pay | Admitting: Urology

## 2019-08-02 DIAGNOSIS — N130 Hydronephrosis with ureteropelvic junction obstruction: Secondary | ICD-10-CM

## 2019-08-02 DIAGNOSIS — N200 Calculus of kidney: Secondary | ICD-10-CM

## 2019-08-09 ENCOUNTER — Ambulatory Visit (HOSPITAL_COMMUNITY): Payer: Medicare (Managed Care)

## 2019-08-12 ENCOUNTER — Other Ambulatory Visit: Payer: Self-pay | Admitting: Nurse Practitioner

## 2019-08-12 DIAGNOSIS — N529 Male erectile dysfunction, unspecified: Secondary | ICD-10-CM

## 2019-08-13 ENCOUNTER — Other Ambulatory Visit: Payer: Self-pay | Admitting: Nurse Practitioner

## 2019-08-13 DIAGNOSIS — R32 Unspecified urinary incontinence: Secondary | ICD-10-CM

## 2019-08-13 DIAGNOSIS — R350 Frequency of micturition: Secondary | ICD-10-CM

## 2019-08-15 NOTE — Patient Instructions (Signed)
Visit Information  Goals Addressed            This Visit's Progress   . Pharmacy Care Plan       CARE PLAN ENTRY  Current Barriers:  . Chronic Disease Management support, education, and care coordination needs related to Hypertension, Hyperlipidemia, Diabetes, and Chronic Kidney Disease   Hypertension . Pharmacist Clinical Goal(s): o Over the next 90 days, patient will work with PharmD and providers to maintain BP goal <130/80 . Current regimen:  o Metoprolol tartrate 50mg  daily o Olmesartan/ HCTZ 40/25mg  daily . Interventions: o Provided dietary and exercise recommendations . Patient self care activities - Over the next 90 days, patient will: o Check BP weekly, document, and provide at future appointments o Ensure daily salt intake < 2300 mg/day o Choose low-sodium/no-sodium canned goods o Exercise 30 minutes 5 times weekly  Diabetes . Pharmacist Clinical Goal(s): o Over the next 90 days, patient will work with PharmD and providers to achieve A1c goal <7% . Current regimen:  o Rybelsus 14mg  daily 30 minutes before breakfast o Tradjenta 5mg  daily . Interventions: o Discuss with PCP changing Tradjenta to [place] for additional kidney protective benefits and blood glucose lower . Patient self care activities - Over the next 90 days, patient will: o Check blood sugar once daily, document, and provide at future appointments o Contact provider with any episodes of hypoglycemia  Medication management . Pharmacist Clinical Goal(s): o Over the next 90 days, patient will work with PharmD and providers to achieve optimal medication adherence . [facility] . Interventions o Comprehensive medication review performed. o Continue current medication management strategy . Patient self care activities - Over the next 90 days, patient will: o Focus on medication adherence by using pill box o Consider medication synchronization and pill packaging program as outlined be  PharmD o Take medications as prescribed o Report any questions or concerns to PharmD and/or provider(s)  Initial goal documentation        [name] was given information about Chronic Care Management services today including:  1. CCM service includes personalized support from designated clinical staff supervised by his physician, including individualized plan of care and coordination with other care providers 2. 24/7 contact phone numbers for assistance for urgent and routine care needs. 3. Standard insurance, coinsurance, copays and deductibles apply for chronic care management only during months in which we provide at least 20 minutes of these services. Most insurances cover these services at 100%, however patients may be responsible for any copay, coinsurance and/or deductible if applicable. This service may help you avoid the need for more expensive face-to-face services. 4. Only one practitioner may furnish and bill the service in a calendar month. 5. The patient may stop CCM services at any time (effective at the end of the month) by phone call to the office staff.  Patient agreed to services and verbal consent obtained.   The patient verbalized understanding of instructions provided today and agreed to receive a mailed copy of patient instruction and/or educational materials. Telephone follow up appointment with pharmacy team member scheduled for: 09/09/19 @ 3:45 PM  Jannette Fogo, PharmD Clinical Pharmacist [facility] [phone]  Diabetes Mellitus and Nutrition, Adult When you have diabetes (diabetes mellitus), it is very important to have healthy eating habits because your blood sugar (glucose) levels are greatly affected by what you eat and drink. Eating healthy foods in the appropriate amounts, at about the same times every day, can help you:  Control your blood glucose.  Lower your risk of heart disease.  Improve your blood pressure.  Reach  or maintain a healthy weight. Every person with diabetes is different, and each person has different needs for a meal plan. Your health care provider may recommend that you work with a diet and nutrition specialist (dietitian) to make a meal plan that is best for you. Your meal plan may vary depending on factors such as:  The calories you need.  The medicines you take.  Your weight.  Your blood glucose, blood pressure, and cholesterol levels.  Your activity level.  Other health conditions you have, such as heart or kidney disease. How do carbohydrates affect me? Carbohydrates, also called carbs, affect your blood glucose level more than any other type of food. Eating carbs naturally raises the amount of glucose in your blood. Carb counting is a method for keeping track of how many carbs you eat. Counting carbs is important to keep your blood glucose at a healthy level, especially if you use insulin or take certain oral diabetes medicines. It is important to know how many carbs you can safely have in each meal. This is different for every person. Your dietitian can help you calculate how many carbs you should have at each meal and for each snack. Foods that contain carbs include:  Bread, cereal, rice, pasta, and crackers.  Potatoes and corn.  Peas, beans, and lentils.  Milk and yogurt.  Fruit and juice.  Desserts, such as cakes, cookies, ice cream, and candy. How does alcohol affect me? Alcohol can cause a sudden decrease in blood glucose (hypoglycemia), especially if you use insulin or take certain oral diabetes medicines. Hypoglycemia can be a life-threatening condition. Symptoms of hypoglycemia (sleepiness, dizziness, and confusion) are similar to symptoms of having too much alcohol. If your health care provider says that alcohol is safe for you, follow these guidelines:  Limit alcohol intake to no more than 1 drink per day for nonpregnant women and 2 drinks per day for men. One  drink equals 12 oz of beer, 5 oz of wine, or 1 oz of hard liquor.  Do not drink on an empty stomach.  Keep yourself hydrated with water, diet soda, or unsweetened iced tea.  Keep in mind that regular soda, juice, and other mixers may contain a lot of sugar and must be counted as carbs. What are tips for following this plan?  Reading food labels  Start by checking the serving size on the "Nutrition Facts" label of packaged foods and drinks. The amount of calories, carbs, fats, and other nutrients listed on the label is based on one serving of the item. Many items contain more than one serving per package.  Check the total grams (g) of carbs in one serving. You can calculate the number of servings of carbs in one serving by dividing the total carbs by 15. For example, if a food has 30 g of total carbs, it would be equal to 2 servings of carbs.  Check the number of grams (g) of saturated and trans fats in one serving. Choose foods that have low or no amount of these fats.  Check the number of milligrams (mg) of salt (sodium) in one serving. Most people should limit total sodium intake to less than 2,300 mg per day.  Always check the nutrition information of foods labeled as "low-fat" or "nonfat". These foods may be higher in added sugar or refined carbs and should be avoided.  Talk  to your dietitian to identify your daily goals for nutrients listed on the label. Shopping  Avoid buying canned, premade, or processed foods. These foods tend to be high in fat, sodium, and added sugar.  Shop around the outside edge of the grocery store. This includes fresh fruits and vegetables, bulk grains, fresh meats, and fresh dairy. Cooking  Use low-heat cooking methods, such as baking, instead of high-heat cooking methods like deep frying.  Cook using healthy oils, such as olive, canola, or sunflower oil.  Avoid cooking with butter, cream, or high-fat meats. Meal planning  Eat meals and snacks  regularly, preferably at the same times every day. Avoid going long periods of time without eating.  Eat foods high in fiber, such as fresh fruits, vegetables, beans, and whole grains. Talk to your dietitian about how many servings of carbs you can eat at each meal.  Eat 4-6 ounces (oz) of lean protein each day, such as lean meat, chicken, fish, eggs, or tofu. One oz of lean protein is equal to: ? 1 oz of meat, chicken, or fish. ? 1 egg. ?  cup of tofu.  Eat some foods each day that contain healthy fats, such as avocado, nuts, seeds, and fish. Lifestyle  Check your blood glucose regularly.  Exercise regularly as told by your health care provider. This may include: ? 150 minutes of moderate-intensity or vigorous-intensity exercise each week. This could be brisk walking, biking, or water aerobics. ? Stretching and doing strength exercises, such as yoga or weightlifting, at least 2 times a week.  Take medicines as told by your health care provider.  Do not use any products that contain nicotine or tobacco, such as cigarettes and e-cigarettes. If you need help quitting, ask your health care provider.  Work with a Social worker or diabetes educator to identify strategies to manage stress and any emotional and social challenges. Questions to ask a health care provider  Do I need to meet with a diabetes educator?  Do I need to meet with a dietitian?  What number can I call if I have questions?  When are the best times to check my blood glucose? Where to find more information:  American Diabetes Association: diabetes.org  Academy of Nutrition and Dietetics: www.eatright.[employer] of Diabetes and Digestive and Kidney Diseases (NIH): [employer] Summary  A healthy meal plan will help you control your blood glucose and maintain a healthy lifestyle.  Working with a diet and nutrition specialist (dietitian) can help you make a meal plan that is best for you.  Keep in  mind that carbohydrates (carbs) and alcohol have immediate effects on your blood glucose levels. It is important to count carbs and to use alcohol carefully. This information is not intended to replace advice given to you by your health care provider. Make sure you discuss any questions you have with your health care provider. Document Revised: 02/14/2017 Document Reviewed: 04/08/2016 Elsevier Patient Education  2020 [employer].

## 2019-08-17 ENCOUNTER — Telehealth: Payer: Self-pay

## 2019-08-18 ENCOUNTER — Ambulatory Visit (HOSPITAL_COMMUNITY): Admission: RE | Admit: 2019-08-18 | Payer: Medicare (Managed Care) | Source: Ambulatory Visit

## 2019-08-18 ENCOUNTER — Other Ambulatory Visit: Payer: Self-pay | Admitting: Nurse Practitioner

## 2019-08-18 ENCOUNTER — Encounter (HOSPITAL_COMMUNITY): Payer: Self-pay

## 2019-08-18 DIAGNOSIS — N529 Male erectile dysfunction, unspecified: Secondary | ICD-10-CM

## 2019-08-26 ENCOUNTER — Ambulatory Visit (INDEPENDENT_AMBULATORY_CARE_PROVIDER_SITE_OTHER): Payer: Medicare (Managed Care) | Admitting: Nurse Practitioner

## 2019-08-26 ENCOUNTER — Other Ambulatory Visit: Payer: Self-pay

## 2019-08-26 ENCOUNTER — Ambulatory Visit (INDEPENDENT_AMBULATORY_CARE_PROVIDER_SITE_OTHER): Payer: Medicare (Managed Care)

## 2019-08-26 ENCOUNTER — Encounter: Payer: Self-pay | Admitting: Nurse Practitioner

## 2019-08-26 VITALS — BP 132/90 | HR 76 | Temp 98.3°F | Ht 66.0 in | Wt 190.8 lb

## 2019-08-26 VITALS — BP 132/90 | HR 76 | Temp 98.3°F | Ht 66.0 in | Wt 190.7 lb

## 2019-08-26 DIAGNOSIS — E1121 Type 2 diabetes mellitus with diabetic nephropathy: Secondary | ICD-10-CM

## 2019-08-26 DIAGNOSIS — I10 Essential (primary) hypertension: Secondary | ICD-10-CM | POA: Diagnosis not present

## 2019-08-26 DIAGNOSIS — N529 Male erectile dysfunction, unspecified: Secondary | ICD-10-CM

## 2019-08-26 DIAGNOSIS — N1831 Chronic kidney disease, stage 3a: Secondary | ICD-10-CM | POA: Diagnosis not present

## 2019-08-26 DIAGNOSIS — Z0000 Encounter for general adult medical examination without abnormal findings: Secondary | ICD-10-CM | POA: Diagnosis not present

## 2019-08-26 DIAGNOSIS — C61 Malignant neoplasm of prostate: Secondary | ICD-10-CM

## 2019-08-26 DIAGNOSIS — R21 Rash and other nonspecific skin eruption: Secondary | ICD-10-CM

## 2019-08-26 LAB — POCT URINALYSIS DIPSTICK
Bilirubin, UA: NEGATIVE
Blood, UA: NEGATIVE
Glucose, UA: POSITIVE — AB
Ketones, UA: NEGATIVE
Leukocytes, UA: NEGATIVE
Nitrite, UA: NEGATIVE
Protein, UA: POSITIVE — AB
Spec Grav, UA: 1.025 (ref 1.010–1.025)
Urobilinogen, UA: 0.2 E.U./dL
pH, UA: 5 (ref 5.0–8.0)

## 2019-08-26 LAB — POCT UA - MICROALBUMIN
Creatinine, POC: 200 mg/dL
Microalbumin Ur, POC: 80 mg/L

## 2019-08-26 MED ORDER — FLUCONAZOLE 100 MG PO TABS: 100.0000 mg | ORAL_TABLET | Freq: Every day | ORAL | 0 refills | Status: DC

## 2019-08-26 MED ORDER — NYSTATIN 100000 UNIT/GM EX POWD: 1.0000 "application " | Freq: Three times a day (TID) | CUTANEOUS | 0 refills | Status: DC

## 2019-08-26 NOTE — Progress Notes (Signed)
This visit occurred during the SARS-CoV-2 public health emergency.  Safety protocols were in place, including screening questions prior to the visit, additional usage of staff PPE, and extensive cleaning of exam room while observing appropriate contact time as indicated for disinfecting solutions.  Subjective:     Patient ID: [name] , male    DOB: 1955-09-22 , 64 y.o.   MRN: [id number]   Chief Complaint  Patient presents with  . Diabetes    HPI   Wt Readings from Last 3 Encounters: 08/26/19 : 190 lb 11.2 oz (86.5 kg) 08/26/19 : 190 lb 12.8 oz (86.5 kg) 05/05/19 : 197 lb 3.2 oz (89.4 kg)  He is planning to go to [facility] - [phone] and fax [phone]    Diabetes He presents for his follow-up diabetic visit. He has type 2 diabetes mellitus. His disease course has been stable. There are no hypoglycemic associated symptoms. Pertinent negatives for hypoglycemia include no dizziness, headaches or nervousness/anxiousness. Pertinent negatives for diabetes include no chest pain, no fatigue, no polydipsia, no polyphagia and no polyuria. There are no hypoglycemic complications. Symptoms are worsening. There are no diabetic complications. Risk factors for coronary artery disease include obesity, male sex, sedentary lifestyle and diabetes mellitus. Current diabetic treatment includes oral agent (dual therapy) (tolerating Rybelsus and Tradjenta well. ). He is compliant with treatment all of the time. He is following a generally unhealthy diet. When asked about meal planning, he reported none. He has not had a previous visit with a dietitian. He rarely participates in exercise. His home blood glucose trend is decreasing steadily. (Average blood sugar of 120 - 140) An ACE inhibitor/angiotensin II receptor blocker is being taken. He does not see a podiatrist.Eye exam is not current.     Past Medical History:  Diagnosis Date  . Closed nondisplaced spiral fracture of shaft of right tibia    . Complicated urinary tract infection 09/06/2016  . Depression   . Diabetes mellitus without complication ([place])   . GERD (gastroesophageal reflux disease)   . Gout    right foot  . Hemorrhoids   . Hypertension   . Hypertension   . Macular degeneration   . Microcytic anemia   . Prostate cancer ([place]) 08/2009  . Rectal bleeding 07/31/2016  . Tubular adenoma      Family History  Problem Relation Age of Onset  . Lung cancer Mother        Deceased  . Throat cancer Brother   . Pancreatic cancer Father   . Heart disease Father        Deceased  . Lung cancer Maternal Uncle        nephew  . Diabetes Other      Current Outpatient Medications:  .  acetaminophen (TYLENOL) 500 MG tablet, Take 500 mg by mouth every 6 (six) hours as needed for mild pain or moderate pain., Disp: , Rfl:  .  aspirin EC 81 MG tablet, Take 81 mg by mouth daily., Disp: , Rfl:  .  atorvastatin (LIPITOR) 20 MG tablet, TAKE 1 TABLET BY MOUTH EVERY DAY, Disp: 90 tablet, Rfl: 1 .  guaiFENesin (MUCINEX) 600 MG 12 hr tablet, Take 1 tablet (600 mg total) by mouth 2 (two) times daily., Disp: 60 tablet, Rfl: 2 .  metoprolol tartrate (LOPRESSOR) 50 MG tablet, TAKE 1 TABLET BY MOUTH EVERY DAY, Disp: 90 tablet, Rfl: 1 .  Multiple Vitamin (MULTIVITAMIN) tablet, Take 1 tablet by mouth daily., Disp: , Rfl:  .  MYRBETRIQ 25 MG TB24 tablet, TAKE 1 TABLET BY MOUTH EVERY DAY, Disp: 90 tablet, Rfl: 1 .  olmesartan-hydrochlorothiazide (BENICAR HCT) 40-25 MG tablet, TAKE 1 TABLET BY MOUTH EVERY DAY, Disp: 90 tablet, Rfl: 1 .  Semaglutide (RYBELSUS) 14 MG TABS, Take 1 tablet by mouth daily. 30 minutes before breakfast, Disp: 30 tablet, Rfl: 3 .  sildenafil (REVATIO) 20 MG tablet, TAKE 1 TABLET BY MOUTH EVERY DAY (PRIOR AUTHORIZATION DENIED), Disp: 30 tablet, Rfl: 0 .  TRADJENTA 5 MG TABS tablet, TAKE 1 TABLET BY MOUTH EVERY DAY, Disp: 90 tablet, Rfl: 0 .  traMADol (ULTRAM) 50 MG tablet, Take 1 tablet (50 mg total) by mouth every 6 (six)  hours as needed for severe pain., Disp: 15 tablet, Rfl: 0 .  traZODone (DESYREL) 50 MG tablet, TAKE 1 TABLET BY MOUTH EVERYDAY AT BEDTIME, Disp: 90 tablet, Rfl: 1   No Known Allergies   Review of Systems  Constitutional: Negative for fatigue.  Respiratory: Negative.   Cardiovascular: Negative for chest pain, palpitations and leg swelling.  Endocrine: Negative for polydipsia, polyphagia and polyuria.  Skin:       Reporting a rash to his right groin arear  Neurological: Negative for dizziness and headaches.  Psychiatric/Behavioral: Negative for agitation. The patient is not nervous/anxious.      Today's Vitals   08/26/19 1418  BP: 132/90  Pulse: 76  Temp: 98.3 F (36.8 C)  TempSrc: Oral  Weight: 190 lb 11.2 oz (86.5 kg)  Height: 5\' 6"  (1.676 m)   Body mass index is 30.78 kg/m.   Objective:  Physical Exam Vitals reviewed.  Constitutional:      General: He is not in acute distress.    Appearance: Normal appearance. He is obese.  Cardiovascular:     Rate and Rhythm: Normal rate and regular rhythm.     Pulses: Normal pulses.     Heart sounds: Normal heart sounds. No murmur heard.   Pulmonary:     Effort: Pulmonary effort is normal. No respiratory distress.     Breath sounds: Normal breath sounds.  Musculoskeletal:        General: No swelling.  Skin:    General: Skin is warm and dry.     Capillary Refill: Capillary refill takes less than 2 seconds.     Findings: No rash (no obvious rash to the groin however his groin area does have erythema present throughout).  Neurological:     General: No focal deficit present.     Mental Status: He is alert and oriented to person, place, and time.  Psychiatric:        Mood and Affect: Mood normal.        Behavior: Behavior normal.        Thought Content: Thought content normal.        Judgment: Judgment normal.         Assessment And Plan:     1. Type 2 diabetes mellitus with diabetic nephropathy, without long-term current  use of insulin (HCC)  chronic,   He is tolerating rybelsus and tradjenta well  Will check his HgbA1c today  2. Essential hypertension  Chronic, fair control  Continue with current medications  3. Stage 3b chronic kidney disease  Encouraged to stay well hydrated and to avoid NSAIDs  4. Erectile dysfunction, unspecified erectile dysfunction type  Refill of sildenafil   5. Rash and nonspecific skin eruption  Erythema present to groin area, no vesicles  Will treat with diflucan and nystatin  powder to use due to sweating  - nystatin (NYSTATIN) powder; Apply 1 application topically 3 (three) times daily.  Dispense: 15 g; Refill: 0 - fluconazole (DIFLUCAN) 100 MG tablet; Take 1 tablet (100 mg total) by mouth daily. Take 1 tablet by mouth now repeat in 5 days  Dispense: 2 tablet; Refill: 0  6. Prostate cancer [facility])  He would like to be seen at [facility] for a second opinion - Ambulatory referral to Urology  [name], [name]    THE PATIENT IS ENCOURAGED TO PRACTICE SOCIAL DISTANCING DUE TO THE COVID-19 [place].

## 2019-08-26 NOTE — Patient Instructions (Signed)
[name] , Thank you for taking time to come for your Medicare Wellness Visit. I appreciate your ongoing commitment to your health goals. Please review the following plan we discussed and let me know if I can assist you in the future.   Screening recommendations/referrals: Colonoscopy: having in July Recommended yearly ophthalmology/optometry visit for glaucoma screening and checkup Recommended yearly dental visit for hygiene and checkup  Vaccinations: Influenza vaccine: decline Pneumococcal vaccine: decline Tdap vaccine: decline Shingles vaccine: decline    Advanced directives: copy in chart  Conditions/risks identified: obesity  Next appointment:   Preventive Care 67 Years and Older, Male Preventive care refers to lifestyle choices and visits with your health care provider that can promote health and wellness. What does preventive care include?  A yearly physical exam. This is also called an annual well check.  Dental exams once or twice a year.  Routine eye exams. Ask your health care provider how often you should have your eyes checked.  Personal lifestyle choices, including:  Daily care of your teeth and gums.  Regular physical activity.  Eating a healthy diet.  Avoiding tobacco and drug use.  Limiting alcohol use.  Practicing safe sex.  Taking low doses of aspirin every day.  Taking vitamin and mineral supplements as recommended by your health care provider. What happens during an annual well check? The services and screenings done by your health care provider during your annual well check will depend on your age, overall health, lifestyle risk factors, and family history of disease. Counseling  Your health care provider may ask you questions about your:  Alcohol use.  Tobacco use.  Drug use.  Emotional well-being.  Home and relationship well-being.  Sexual activity.  Eating habits.  History of falls.  Memory and ability to understand  (cognition).  Work and work Statistician. Screening  You may have the following tests or measurements:  Height, weight, and BMI.  Blood pressure.  Lipid and cholesterol levels. These may be checked every 5 years, or more frequently if you are over 67 years old.  Skin check.  Lung cancer screening. You may have this screening every year starting at age 63 if you have a 30-pack-year history of smoking and currently smoke or have quit within the past 15 years.  Fecal occult blood test (FOBT) of the stool. You may have this test every year starting at age 11.  Flexible sigmoidoscopy or colonoscopy. You may have a sigmoidoscopy every 5 years or a colonoscopy every 10 years starting at age 55.  Prostate cancer screening. Recommendations will vary depending on your family history and other risks.  Hepatitis C blood test.  Hepatitis B blood test.  Sexually transmitted disease (STD) testing.  Diabetes screening. This is done by checking your blood sugar (glucose) after you have not eaten for a while (fasting). You may have this done every 1-3 years.  Abdominal aortic aneurysm (AAA) screening. You may need this if you are a current or former smoker.  Osteoporosis. You may be screened starting at age 54 if you are at high risk. Talk with your health care provider about your test results, treatment options, and if necessary, the need for more tests. Vaccines  Your health care provider may recommend certain vaccines, such as:  Influenza vaccine. This is recommended every year.  Tetanus, diphtheria, and acellular pertussis (Tdap, Td) vaccine. You may need a Td booster every 10 years.  Zoster vaccine. You may need this after age 54.  Pneumococcal 13-valent conjugate (  PCV13) vaccine. One dose is recommended after age 70.  Pneumococcal polysaccharide (PPSV23) vaccine. One dose is recommended after age 59. Talk to your health care provider about which screenings and vaccines you need and  how often you need them. This information is not intended to replace advice given to you by your health care provider. Make sure you discuss any questions you have with your health care provider. Document Released: 03/31/2015 Document Revised: 11/22/2015 Document Reviewed: 01/03/2015 Elsevier Interactive Patient Education  2017 [place] Prevention in the Home Falls can cause injuries. They can happen to people of all ages. There are many things you can do to make your home safe and to help prevent falls. What can I do on the outside of my home?  Regularly fix the edges of walkways and driveways and fix any cracks.  Remove anything that might make you trip as you walk through a door, such as a raised step or threshold.  Trim any bushes or trees on the path to your home.  Use bright outdoor lighting.  Clear any walking paths of anything that might make someone trip, such as rocks or tools.  Regularly check to see if handrails are loose or broken. Make sure that both sides of any steps have handrails.  Any raised decks and porches should have guardrails on the edges.  Have any leaves, snow, or ice cleared regularly.  Use sand or salt on walking paths during winter.  Clean up any spills in your garage right away. This includes oil or grease spills. What can I do in the bathroom?  Use night lights.  Install grab bars by the toilet and in the tub and shower. Do not use towel bars as grab bars.  Use non-skid mats or decals in the tub or shower.  If you need to sit down in the shower, use a plastic, non-slip stool.  Keep the floor dry. Clean up any water that spills on the floor as soon as it happens.  Remove soap buildup in the tub or shower regularly.  Attach bath mats securely with double-sided non-slip rug tape.  Do not have throw rugs and other things on the floor that can make you trip. What can I do in the bedroom?  Use night lights.  Make sure that you  have a light by your bed that is easy to reach.  Do not use any sheets or blankets that are too big for your bed. They should not hang down onto the floor.  Have a firm chair that has side arms. You can use this for support while you get dressed.  Do not have throw rugs and other things on the floor that can make you trip. What can I do in the kitchen?  Clean up any spills right away.  Avoid walking on wet floors.  Keep items that you use a lot in easy-to-reach places.  If you need to reach something above you, use a strong step stool that has a grab bar.  Keep electrical cords out of the way.  Do not use floor polish or wax that makes floors slippery. If you must use wax, use non-skid floor wax.  Do not have throw rugs and other things on the floor that can make you trip. What can I do with my stairs?  Do not leave any items on the stairs.  Make sure that there are handrails on both sides of the stairs and use them. Fix handrails that  are broken or loose. Make sure that handrails are as long as the stairways.  Check any carpeting to make sure that it is firmly attached to the stairs. Fix any carpet that is loose or worn.  Avoid having throw rugs at the top or bottom of the stairs. If you do have throw rugs, attach them to the floor with carpet tape.  Make sure that you have a light switch at the top of the stairs and the bottom of the stairs. If you do not have them, ask someone to add them for you. What else can I do to help prevent falls?  Wear shoes that:  Do not have high heels.  Have rubber bottoms.  Are comfortable and fit you well.  Are closed at the toe. Do not wear sandals.  If you use a stepladder:  Make sure that it is fully opened. Do not climb a closed stepladder.  Make sure that both sides of the stepladder are locked into place.  Ask someone to hold it for you, if possible.  Clearly mark and make sure that you can see:  Any grab bars or  handrails.  First and last steps.  Where the edge of each step is.  Use tools that help you move around (mobility aids) if they are needed. These include:  Canes.  Walkers.  Scooters.  Crutches.  Turn on the lights when you go into a dark area. Replace any light bulbs as soon as they burn out.  Set up your furniture so you have a clear path. Avoid moving your furniture around.  If any of your floors are uneven, fix them.  If there are any pets around you, be aware of where they are.  Review your medicines with your doctor. Some medicines can make you feel dizzy. This can increase your chance of falling. Ask your doctor what other things that you can do to help prevent falls. This information is not intended to replace advice given to you by your health care provider. Make sure you discuss any questions you have with your health care provider. Document Released: 12/29/2008 Document Revised: 08/10/2015 Document Reviewed: 04/08/2014 Elsevier Interactive Patient Education  2017 [employer].

## 2019-08-26 NOTE — Progress Notes (Signed)
This visit occurred during the SARS-CoV-2 public health emergency.  Safety protocols were in place, including screening questions prior to the visit, additional usage of staff PPE, and extensive cleaning of exam room while observing appropriate contact time as indicated for disinfecting solutions.  Subjective:   CLOYDE OREGEL is a 64 y.o. male who presents for Medicare Annual/Subsequent preventive examination.  Review of Systems:  n/a Cardiac Risk Factors include: advanced age (>18men, >68 women);diabetes mellitus;hypertension;male gender;obesity (BMI >30kg/m2);sedentary lifestyle     Objective:    Vitals: BP 132/90 (BP Location: Left Arm, Patient Position: Sitting, Cuff Size: Normal)   Pulse 76   Temp 98.3 F (36.8 C) (Oral)   Ht 5\' 6"  (1.676 m)   Wt 190 lb 12.8 oz (86.5 kg)   SpO2 97%   BMI 30.80 kg/m   Body mass index is 30.8 kg/m.  Advanced Directives 08/26/2019 08/25/2018 10/11/2017 09/30/2016 09/19/2016 09/06/2016 08/28/2016  Does Patient Have a Medical Advance Directive? Yes Yes No No No No Yes  Type of Advance Directive Out of facility DNR (pink MOST or yellow form) Out of facility DNR (pink MOST or yellow form) - - Out of facility DNR (pink MOST or yellow form) - Out of facility DNR (pink MOST or yellow form)  Does patient want to make changes to medical advance directive? - (No Data) - No - Patient declined No - Patient declined No - Patient declined No - Patient declined  Would patient like information on creating a medical advance directive? - - No - Patient declined - No - Patient declined No - Patient declined -  Pre-existing out of facility DNR order (yellow form or pink MOST form) - - - - Pink MOST form placed in chart (order not valid for inpatient use) - Pink MOST form placed in chart (order not valid for inpatient use)    Tobacco Social History   Tobacco Use  Smoking Status Never Smoker  Smokeless Tobacco Never Used     Counseling given: Not Answered   Clinical  Intake:  Pre-visit preparation completed: Yes  Pain : 0-10 Pain Score: 5  Pain Type: Chronic pain Pain Location: Back Pain Orientation: Lower, Right Pain Descriptors / Indicators: Dull Pain Onset: More than a month ago Pain Frequency: Constant Pain Relieving Factors: heating pad helps  Pain Relieving Factors: heating pad helps  Nutritional Status: BMI > 30  Obese Nutritional Risks: None Diabetes: Yes  How often do you need to have someone help you when you read instructions, pamphlets, or other written materials from your doctor or pharmacy?: 1 - Never What is the last grade level you completed in school?: 10th grade  Interpreter Needed?: No  Information entered by :: NAllen LPN  Past Medical History:  Diagnosis Date  . Closed nondisplaced spiral fracture of shaft of right tibia   . Complicated urinary tract infection 09/06/2016  . Depression   . Diabetes mellitus without complication ([place])   . GERD (gastroesophageal reflux disease)   . Gout    right foot  . Hemorrhoids   . Hypertension   . Hypertension   . Macular degeneration   . Microcytic anemia   . Prostate cancer ([place]) 08/2009  . Rectal bleeding 07/31/2016  . Tubular adenoma    Past Surgical History:  Procedure Laterality Date  . CYSTOSCOPY WITH RETROGRADE PYELOGRAM, URETEROSCOPY AND STENT PLACEMENT Right 07/31/2016   Procedure: CYSTOSCOPY WITH RIGHT  RETROGRADE PYELOGRAM, URETEROSCOPY;  Surgeon: [name], MD;  Location: [facility];  Service:  Urology;  Laterality: Right;  . INGUINAL HERNIA REPAIR    . Left ankle joint fusion  1981  . PROSTATE BIOPSY     x 2  . URETERAL REIMPLANTION  07/31/2016   Procedure: URETERAL REIMPLANT, right boari flap, right psoas hitch;  Surgeon: [name], MD;  Location: [facility];  Service: Urology;;   Family History  Problem Relation Age of Onset  . Lung cancer Mother        Deceased  . Throat cancer Brother   . Pancreatic cancer Father   . Heart disease Father         Deceased  . Lung cancer Maternal Uncle        nephew  . Diabetes Other    Social History   Socioeconomic History  . Marital status: Divorced    Spouse name: Not on file  . Number of children: 0  . Years of education: Not on file  . Highest education level: Not on file  Occupational History  . Occupation: retired  Tobacco Use  . Smoking status: Never Smoker  . Smokeless tobacco: Never Used  Vaping Use  . Vaping Use: Never used  Substance and Sexual Activity  . Alcohol use: Yes    Comment: ocassionally  . Drug use: No  . Sexual activity: Yes  Other Topics Concern  . Not on file  Social History Narrative  . Not on file   Social Determinants of Health   Financial Resource Strain: Low Risk   . Difficulty of Paying Living Expenses: Not hard at all  Food Insecurity: No Food Insecurity  . Worried About Charity fundraiser in the Last Year: Never true  . Ran Out of Food in the Last Year: Never true  Transportation Needs: No Transportation Needs  . Lack of Transportation (Medical): No  . Lack of Transportation (Non-Medical): No  Physical Activity: Insufficiently Active  . Days of Exercise per Week: 3 days  . Minutes of Exercise per Session: 10 min  Stress: No Stress Concern Present  . Feeling of Stress : Only a little  Social Connections:   . Frequency of Communication with Friends and Family:   . Frequency of Social Gatherings with Friends and Family:   . Attends Religious Services:   . Active Member of Clubs or Organizations:   . Attends Archivist Meetings:   Marland Kitchen Marital Status:     Outpatient Encounter Medications as of 08/26/2019  Medication Sig  . acetaminophen (TYLENOL) 500 MG tablet Take 500 mg by mouth every 6 (six) hours as needed for mild pain or moderate pain.  Marland Kitchen aspirin EC 81 MG tablet Take 81 mg by mouth daily.  Marland Kitchen atorvastatin (LIPITOR) 20 MG tablet TAKE 1 TABLET BY MOUTH EVERY DAY  . guaiFENesin (MUCINEX) 600 MG 12 hr tablet Take 1 tablet  (600 mg total) by mouth 2 (two) times daily.  . metoprolol tartrate (LOPRESSOR) 50 MG tablet TAKE 1 TABLET BY MOUTH EVERY DAY  . Multiple Vitamin (MULTIVITAMIN) tablet Take 1 tablet by mouth daily.  Marland Kitchen MYRBETRIQ 25 MG TB24 tablet TAKE 1 TABLET BY MOUTH EVERY DAY  . olmesartan-hydrochlorothiazide (BENICAR HCT) 40-25 MG tablet TAKE 1 TABLET BY MOUTH EVERY DAY  . Semaglutide (RYBELSUS) 14 MG TABS Take 1 tablet by mouth daily. 30 minutes before breakfast  . sildenafil (REVATIO) 20 MG tablet TAKE 1 TABLET BY MOUTH EVERY DAY (PRIOR AUTHORIZATION DENIED)  . TRADJENTA 5 MG TABS tablet TAKE 1 TABLET BY MOUTH EVERY  DAY  . traMADol (ULTRAM) 50 MG tablet Take 1 tablet (50 mg total) by mouth every 6 (six) hours as needed for severe pain.  . traZODone (DESYREL) 50 MG tablet TAKE 1 TABLET BY MOUTH EVERYDAY AT BEDTIME   No facility-administered encounter medications on file as of 08/26/2019.    Activities of Daily Living In your present state of health, do you have any difficulty performing the following activities: 08/26/2019  Hearing? N  Vision? Y  Comment macular degeneration  Difficulty concentrating or making decisions? N  Walking or climbing stairs? Y  Dressing or bathing? Y  Comment getting out of bath tub  Doing errands, shopping? N  Preparing Food and eating ? Y  Comment has an Engineer, production  Using the Toilet? N  In the past six months, have you accidently leaked urine? Y  Do you have problems with loss of bowel control? N  Managing your Medications? Y  Comment aide sets up, but knows what they are  Managing your Finances? N  Housekeeping or managing your Housekeeping? Y  Comment has an aide  Some recent data might be hidden    Patient Care Team: [name], [place] as PCP - General ([place]) Rex Kras, Claudette Stapler, RN as Case Manager [place], Kennieth Francois, [facility] (Pharmacist)   Assessment:   This is a routine wellness examination for [place].  Exercise Activities and Dietary  recommendations Current Exercise Habits: Home exercise routine, Type of exercise: walking, Time (Minutes): 10, Frequency (Times/Week): 3, Weekly Exercise (Minutes/Week): 30  Goals    .  "I would like to keep my BP under good control" (pt-stated)      Current Barriers:  Marland Kitchen Knowledge Deficits related to disease process and Self Health management of HTN . Chronic Disease Management support and education needs related to HTN, DMII, CKDIII  Nurse Case Manager Clinical Goal(s):  Marland Kitchen Over the next 90 days, patient will work with the  CM and PCP to address needs related to disease education and support to maintain BP <130/80  Interventions:  . Evaluation of current treatment plan related to Hypertension and patient's adherence to plan as established by provider. . Provided education to patient re: target BP <130/80; discussed importance of self monitoring BP at home to check for consistency with BP; education provided about the importance of restricting Sodium and taking BP medications exactly as prescribed; education provided related to how uncontrolled HTN may cause kidney damage  . Reviewed medications with patient and discussed indication, dosage and frequency of BP meds and patient is adhering to taking his medications exactly as prescribed w/o missed doses . Discussed plans with patient for ongoing care management follow up and provided patient with direct contact information for care management team . Advised patient, providing education and rationale, to monitor blood pressure daily and record, calling the CCM team and or PCP for findings outside established parameters.  . Provided patient with printed educational materials related to Why Should I Restrict Sodium? Life's Simple 7  Patient Self Care Activities:  . Self administers medications as prescribed . Attends all scheduled provider appointments . Calls pharmacy for medication refills . Calls provider office for new concerns or  questions   Initial goal documentation     .  "I would like to lower my A1C" (pt-stated)      Current Barriers:  Marland Kitchen Knowledge Deficits related to disease process and Self Health management of DM . Chronic Disease Management support and education needs related to DMII, HTN, CKDIII  Nurse Case Manager Clinical Goal(s):  Marland Kitchen Over the next 90 days, patient will work with the [place] CM and PCP to address needs related to disease management and support for improved Self Health management of Diabetes . Over the next 90 days, patient will lower his A1C <9.4  Interventions:  . Evaluation of current treatment plan related to Diabetes and patient's adherence to plan as established by provider. . Provided education to patient re: target A1C of <7.0; reviewed and discussed most recent A1C of 9.4 obtained on 02/02/19; education provided on how to achieve this goal, including medication adherence, diet adherence, implementation of daily/weekly exercise . Reviewed medications with patient and discussed indication , dosage and frequency of diabetic medications and patient verbalizes understanding, he reports having good adherence without missed doses . Collaborated with Lottie Dawson Pharm D via in basket message requesting outreach to patient to review his current diabetic regimen to ensure no further recommendations are needed . Discussed plans with patient for ongoing care management follow up and provided patient with direct contact information for care management team . Provided patient with printed educational materials related to Diabetes Disease Management with Meal Planning, Counting Carbs, Diabetes Zone Safety Tool, signs/symptoms of hypo/hyperglycemia . Advised patient, providing education and rationale, to check cbg daily before meals and record, calling the PCP and or CCM team for findings outside established parameters.    PharmD interventions: Completed call with patient on 05/10/19 -Patient  started on GLP-1 BY MOUTH (Rybelsus).  He is to take this with a full glass of water in the AM 2-3 hrs separate from other medications.  -Patient is not checking Bgs at home.  I strongly encouraged patient to check Bgs at home in order for [place] to be able to adjust his medications for optimal outcomes -Metformin has been discontinued by nephrologist due to kidneys -He remains on Tradjenta for diabetes (this medication does not require renal adjustments)  Patient Self Care Activities:  . Attends all scheduled provider appointments . Calls pharmacy for medication refills . Calls provider office for new concerns or questions   Please see past updates related to this goal by clicking on the "Past Updates" button in the selected goal      .  "I would like to save my kidneys" (pt-stated)      Current Barriers:  Marland Kitchen Knowledge Deficits related to disease process and Self Health management of CKD . Chronic Disease Management support and education needs related to CKDIII, HTN, DMII  Nurse Case Manager Clinical Goal(s):  Marland Kitchen Over the next 90 days, patient will work with CCM RN and Nephrologist to address needs related to disease education and support to improve Self Health management of CKD  Interventions:  . Evaluation of current treatment plan related to CKD and patient's adherence to plan as established by provider. . Provided education to patient re: potential complications from uncontrolled diabetes and High Blood Pressure resulting in kidney damage; educated patient on the stages of CKD and determined patient is being following by a Nephrologist for management and monitoring of this condition  . Discussed plans with patient for ongoing care management follow up and provided patient with direct contact information for care management team . Provided patient with printed educational materials related to the stages of Chronic Kidney Disease  Patient Self Care Activities:  . Self administers  medications as prescribed . Attends all scheduled provider appointments . Calls pharmacy for medication refills . Calls provider office for new concerns or questions  Initial goal documentation     .  Patient Stated      Needs to see urologist    .  Patient Stated      08/26/2019, would like to save kidney    .  Pharmacy Care Plan      CARE PLAN ENTRY  Current Barriers:  . Chronic Disease Management support, education, and care coordination needs related to Hypertension, Hyperlipidemia, Diabetes, and Chronic Kidney Disease   Hypertension . Pharmacist Clinical Goal(s): o Over the next 90 days, patient will work with PharmD and providers to maintain BP goal <130/80 . Current regimen:  o Metoprolol tartrate 50mg  daily o Olmesartan/ HCTZ 40/25mg  daily . Interventions: o Provided dietary and exercise recommendations . Patient self care activities - Over the next 90 days, patient will: o Check BP weekly, document, and provide at future appointments o Ensure daily salt intake < 2300 mg/day o Choose low-sodium/no-sodium canned goods o Exercise 30 minutes 5 times weekly  Diabetes . Pharmacist Clinical Goal(s): o Over the next 90 days, patient will work with PharmD and providers to achieve A1c goal <7% . Current regimen:  o Rybelsus 14mg  daily 30 minutes before breakfast o Tradjenta 5mg  daily . Interventions: o Discuss with PCP changing Tradjenta to [place] for additional kidney protective benefits and blood glucose lower . Patient self care activities - Over the next 90 days, patient will: o Check blood sugar once daily, document, and provide at future appointments o Contact provider with any episodes of hypoglycemia  Medication management . Pharmacist Clinical Goal(s): o Over the next 90 days, patient will work with PharmD and providers to achieve optimal medication adherence . Current pharmacy: [facility] . Interventions o Comprehensive medication review performed. o Continue  current medication management strategy . Patient self care activities - Over the next 90 days, patient will: o Focus on medication adherence by using pill box o Consider medication synchronization and pill packaging program as outlined be PharmD o Take medications as prescribed o Report any questions or concerns to PharmD and/or provider(s)  Initial goal documentation        Fall Risk Fall Risk  08/26/2019 02/02/2019 10/12/2018 08/25/2018 03/26/2018  Falls in the past year? 1 0 0 0 0  Comment leg gives away - - - -  Number falls in past yr: 1 - - - -  Injury with Fall? 0 - - - -  Risk Factor Category  - - - - -  Risk for fall due to : History of fall(s);Medication side effect - - Impaired vision;Medication side effect -  Follow up Falls evaluation completed;Education provided;Falls prevention discussed - - Education provided;Falls prevention discussed -   Is the patient's home free of loose throw rugs in walkways, pet beds, electrical cords, etc?   yes      Grab bars in the bathroom? yes      Handrails on the stairs?   yes      Adequate lighting?   yes  Timed Get Up and Go Performed: n/a  Depression Screen PHQ 2/9 Scores 08/26/2019 02/02/2019 10/12/2018 08/25/2018  PHQ - 2 Score 2 0 4 0  PHQ- 9 Score 4 - 7 0    Cognitive Function     6CIT Screen 08/26/2019 08/25/2018  What Year? 0 points 0 points  What month? 0 points 0 points  What time? 0 points 0 points  Count back from 20 0 points 0 points  Months in reverse 0 points 0 points  Repeat phrase  2 points 0 points  Total Score 2 0    Immunization History  Administered Date(s) Administered  . Influenza Split 12/04/2009  . PPD Test 05/29/2016    Qualifies for Shingles Vaccine? yes  Screening Tests Health Maintenance  Topic Date Due  . OPHTHALMOLOGY EXAM  Never done  . COVID-19 Vaccine (1) Never done  . COLONOSCOPY  02/19/2017  . PNA vac Low Risk Adult (1 of 2 - PCV13) 05/04/2020 (Originally 01/21/2019)  . TETANUS/TDAP   08/25/2020 (Originally 01/20/1975)  . INFLUENZA VACCINE  10/17/2019  . HEMOGLOBIN A1C  11/02/2019  . FOOT EXAM  05/04/2020  . Hepatitis C Screening  Completed  . HIV Screening  Completed   Cancer Screenings: Lung: Low Dose CT Chest recommended if Age 53-80 years, 30 pack-year currently smoking OR have quit w/in 15years. Patient does not qualify. Colorectal: 02/2012  Additional Screenings:  Hepatitis C Screening:05/05/2019      Plan:    Patient would like to get a second opinion about his kidneys.  I have personally reviewed and noted the following in the patient's chart:   . Medical and social history . Use of alcohol, tobacco or illicit drugs  . Current medications and supplements . Functional ability and status . Nutritional status . Physical activity . Advanced directives . List of other physicians . Hospitalizations, surgeries, and ER visits in previous 12 months . Vitals . Screenings to include cognitive, depression, and falls . Referrals and appointments  In addition, I have reviewed and discussed with patient certain preventive protocols, quality metrics, and best practice recommendations. A written personalized care plan for preventive services as well as general preventive health recommendations were provided to patient.     Kellie Simmering, LPN  [id number]

## 2019-08-27 LAB — CMP14+EGFR
ALT: 26 IU/L (ref 0–44)
AST: 19 IU/L (ref 0–40)
Albumin/Globulin Ratio: 1.4 (ref 1.2–2.2)
Albumin: 4.3 g/dL (ref 3.8–4.8)
Alkaline Phosphatase: 129 IU/L — ABNORMAL HIGH (ref 48–121)
BUN/Creatinine Ratio: 20 (ref 10–24)
BUN: 35 mg/dL — ABNORMAL HIGH (ref 8–27)
Bilirubin Total: 0.4 mg/dL (ref 0.0–1.2)
CO2: 17 mmol/L — ABNORMAL LOW (ref 20–29)
Calcium: 9.7 mg/dL (ref 8.6–10.2)
Chloride: 104 mmol/L (ref 96–106)
Creatinine, Ser: 1.76 mg/dL — ABNORMAL HIGH (ref 0.76–1.27)
GFR calc Af Amer: 46 mL/min/{1.73_m2} — ABNORMAL LOW (ref >59)
GFR calc non Af Amer: 40 mL/min/{1.73_m2} — ABNORMAL LOW (ref >59)
Globulin, Total: 3.1 g/dL (ref 1.5–4.5)
Glucose: 188 mg/dL — ABNORMAL HIGH (ref 65–99)
Potassium: 4.8 mmol/L (ref 3.5–5.2)
Sodium: 138 mmol/L (ref 134–144)
Total Protein: 7.4 g/dL (ref 6.0–8.5)

## 2019-08-27 LAB — LIPID PANEL
Chol/HDL Ratio: 5.5 ratio — ABNORMAL HIGH (ref 0.0–5.0)
Cholesterol, Total: 187 mg/dL (ref 100–199)
HDL: 34 mg/dL — ABNORMAL LOW (ref >39)
LDL Chol Calc (NIH): 106 mg/dL — ABNORMAL HIGH (ref 0–99)
Triglycerides: 270 mg/dL — ABNORMAL HIGH (ref 0–149)
VLDL Cholesterol Cal: 47 mg/dL — ABNORMAL HIGH (ref 5–40)

## 2019-08-27 LAB — HEMOGLOBIN A1C
Est. average glucose Bld gHb Est-mCnc: 177 mg/dL
Hgb A1c MFr Bld: 7.8 % — ABNORMAL HIGH (ref 4.8–5.6)

## 2019-09-09 ENCOUNTER — Ambulatory Visit: Payer: Self-pay

## 2019-09-09 ENCOUNTER — Other Ambulatory Visit: Payer: Self-pay | Admitting: Nurse Practitioner

## 2019-09-09 ENCOUNTER — Other Ambulatory Visit: Payer: Self-pay

## 2019-09-09 DIAGNOSIS — N529 Male erectile dysfunction, unspecified: Secondary | ICD-10-CM

## 2019-09-09 DIAGNOSIS — E1121 Type 2 diabetes mellitus with diabetic nephropathy: Secondary | ICD-10-CM

## 2019-09-09 DIAGNOSIS — N1831 Chronic kidney disease, stage 3a: Secondary | ICD-10-CM

## 2019-09-09 DIAGNOSIS — I10 Essential (primary) hypertension: Secondary | ICD-10-CM

## 2019-09-09 NOTE — Telephone Encounter (Signed)
Sildenafil refill  

## 2019-09-09 NOTE — Chronic Care Management (AMB) (Signed)
Chronic Care Management Pharmacy  Name: [name]  MRN: [id number] DOB: [birth year]  Chief Complaint/ HPI  [name],  64 y.o. , male presents for their Follow-Up CCM visit with the clinical pharmacist via telephone due to COVID-19 Pandemic.  PCP : [name], [name]  Their chronic conditions include: Hypertension, Diabetes, CKD, Dyslipidemia, Depressive disorder, Insomnia   Office Visits: 08/26/19 AWV and OV: Presented for DM follow up. Pt reported rash to right groin area. Pt tolerating Rybelsus and Tradjenta well. Labs ordered (HgbA1c). Pt encouraged to stay well hydrated and avoid NSAIDs. Sildenafil refilled. Started nystatin powder and oral fluconazole '100mg'$  daily for 5 days for erythema present to groin area. Referred to [facility] for second opinion regarding prostate cancer.   05/05/19 OV: Follow up diabetic visit. Labs ordered (HgbA1c, Lipid panel, CMP14+EGFR). Recommended to remain hydrated to help kidney function. Diet and exercise education provided. Started sildenafil '20mg'$  daily for erectile dysfunction.  03/25/19 Telemed visit: Presented for wheezing/non-productive cough over the past week. Started Mucinex as needed for chest congestion  02/02/19 OV: Follow up diabetic visit. BG was 226 in office. Diabetes is poorly controlled, started on Rybelsus '7mg'$  daily. Labs ordered (HgbA1c, CMP14+EGFR). Referred to CCM RN for diabetes education. Stage 3b CKD  Consult Visits: 09/07/19 Dermatology OV w/ Dr. Shaaron Adler: Presented as new pt for itching and burning on chest/back/groin. Pt has been going to tanning bed TIW which he thinks has helped. Used gold bond powder on groin area. Uses Ivory soap and Tide Free and Clear detergent. Decrease light therapy to 5 minutes since pt is very tan. Started gabapentin '100mg'$  at bedtime for itching and burning.  05/12/19 Telephone call: Sildenafil PA was denied by the insurance  05/11/19 OV w/ Urology w/ Dr. 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 Restart/ Continue alfuzosin '10mg'$  daily. PSA (13.03 H)and urine culture ordered. Follow up in 4 months.Previously on Rapaflo for LUTS. Myrbetriq started for urgency and urge incontinence.   CCM Encounters: 05/10/19 PharmD: Encouraged pt to check BG at home. Metformin was d/c by nephrologist due to kidney function. Counseled pt on correct administration of Rybelsus.   04/22/19 RN: Care plan established for chronic disease states.   03/05/19 SW: CCM consent obtained  Medications: No facility-administered encounter medications on file as of 09/09/2019.   Outpatient Encounter Medications as of 09/09/2019  Medication Sig  . acetaminophen (TYLENOL) 500 MG tablet Take 500 mg by mouth every 6 (six) hours as needed for mild pain or moderate pain.  Marland Kitchen aspirin EC 81 MG tablet Take 81 mg by mouth daily.  . fluconazole (DIFLUCAN) 100 MG tablet Take 1 tablet (100 mg total) by mouth daily. Take 1 tablet by mouth now repeat in 5 days (Patient not taking: Reported on 09/28/2019)  . guaiFENesin (MUCINEX) 600 MG 12 hr tablet Take 1 tablet (600 mg total) by mouth 2 (two) times daily. (Patient not taking: Reported on 09/28/2019)  . metoprolol tartrate (LOPRESSOR) 50 MG tablet TAKE 1 TABLET BY MOUTH EVERY DAY  . Multiple Vitamin (MULTIVITAMIN) tablet Take 1 tablet by mouth daily.  Marland Kitchen MYRBETRIQ 25 MG TB24 tablet TAKE 1 TABLET BY MOUTH EVERY DAY  . nystatin (NYSTATIN) powder Apply 1 application topically 3 (three) times daily.  Marland Kitchen olmesartan-hydrochlorothiazide (BENICAR HCT) 40-25 MG tablet TAKE 1 TABLET BY MOUTH EVERY DAY  . traMADol (ULTRAM) 50 MG tablet Take 1 tablet (50 mg total) by mouth every 6 (six) hours as needed for  severe pain. (Patient not taking: Reported on 09/28/2019)  . traZODone (DESYREL) 50 MG tablet TAKE 1 TABLET BY MOUTH EVERYDAY AT BEDTIME (Patient not taking: Reported on 09/28/2019)  . [DISCONTINUED] atorvastatin (LIPITOR) 20 MG tablet TAKE 1  TABLET BY MOUTH EVERY DAY  . [DISCONTINUED] Semaglutide (RYBELSUS) 14 MG TABS Take 1 tablet by mouth daily. 30 minutes before breakfast  . [DISCONTINUED] sildenafil (REVATIO) 20 MG tablet TAKE 1 TABLET BY MOUTH EVERY DAY (PRIOR AUTHORIZATION DENIED)  . [DISCONTINUED] TRADJENTA 5 MG TABS tablet TAKE 1 TABLET BY MOUTH EVERY DAY   Current Diagnosis/Assessment: SDOH Interventions     Most Recent Value  SDOH Interventions  Financial Strain Interventions Intervention Not Indicated      Goals Addressed            This Visit's Progress   . Pharmacy Care Plan       CARE PLAN ENTRY  Current Barriers:  . Chronic Disease Management support, education, and care coordination needs related to Hypertension, Hyperlipidemia, Diabetes, and Chronic Kidney Disease   Hypertension . Pharmacist Clinical Goal(s): o Over the next 90 days, patient will work with PharmD and providers to maintain BP goal <130/80 . Current regimen:  o Metoprolol tartrate '50mg'$  daily o Olmesartan/ HCTZ 40/'25mg'$  daily . Interventions: o Provided dietary and exercise recommendations . Patient self care activities - Over the next 90 days, patient will: o Check BP weekly, document, and provide at future appointments o Ensure daily salt intake < 2300 mg/day o Choose low-sodium/no-sodium canned goods o Exercise 30 minutes 5 times weekly  Diabetes . Pharmacist Clinical Goal(s): o Over the next 90 days, patient will work with PharmD and providers to achieve A1c goal <7% . Current regimen:  o Rybelsus '14mg'$  daily 30 minutes before breakfast o Tradjenta '5mg'$  daily . Interventions: o Discussed with PCP changing Tradjenta to [place] for additional kidney protective benefits and blood glucose lowering. PCP to discuss with patient o Advised patient to check blood sugar first thing in the morning before breakfast . Patient self care activities - Over the next 90 days, patient will: o Check blood sugar once daily, document, and  provide at future appointments o Contact provider with any episodes of hypoglycemia o Exercise 30 minutes 5 times weekly  Hyperlipidemia Lab Results  Component Value Date/Time   LDLCALC 106 (H) 08/26/2019 03:42 PM   . Pharmacist Clinical Goal(s): o Over the next 90 days, patient will work with PharmD and providers to achieve LDL goal < 70 . Current regimen:  o Atorvastatin '20mg'$  daily . Interventions: o Provided dietary and exercise recommendations o Discuss increasing atorvastatin '20mg'$  to atorvastatin '40mg'$  daily with PCP due to LDL >100 and ASCVD risk . Patient self care activities - Over the next 90 days, patient will: o Take cholesterol medication daily as directed o Exercise 30 minutes 5 times weekly  Medication management . Pharmacist Clinical Goal(s): o Over the next 90 days, patient will work with PharmD and providers to achieve optimal medication adherence . [facility] . Interventions o Comprehensive medication review performed. o Continue current medication management strategy . Patient self care activities - Over the next 90 days, patient will: o Focus on medication adherence by using pill box o Consider medication synchronization and pill packaging program as outlined be PharmD o Take medications as prescribed o Report any questions or concerns to PharmD and/or provider(s)  Please see past updates related to this goal by clicking on the "Past Updates" button in the selected goal  Diabetes   Recent Relevant Labs: Lab Results  Component Value Date/Time   HGBA1C 7.8 (H) 08/26/2019 03:42 PM   HGBA1C 9.2 (H) 05/05/2019 10:28 AM   HGBA1C 7.6 06/26/2016 12:00 AM   HGBA1C 7.6 06/26/2016 12:00 AM   MICROALBUR 80 08/26/2019 03:15 PM   MICROALBUR 150 03/26/2018 11:57 AM   Kidney Function Lab Results  Component Value Date/Time   CREATININE 1.62 (H) 10/03/2019 01:59 AM   CREATININE 1.71 (H) 10/02/2019 07:10 PM   GFRNONAA 44 (L) 10/03/2019 01:59  AM   GFRAA 51 (L) 10/03/2019 01:59 AM  Stage 3b CKD   Checking BG: Every other day  Recent Random BG Readings: 180s Patient has failed these meds in past: Metformin Patient is currently uncontrolled but improved on the following medications:   Rybelsus '14mg'$  daily 30 minutes before breakfast  Tradjenta '5mg'$  daily  Last diabetic Foot exam: Not on file Last diabetic Eye exam: 06/17/19, no retinopathy, macular atrophy  We discussed:   Diet extensively  Has cut way back on sweets  Exercise extensively  Pt reports walking 3 days a week for 10-15 minutes  Recommend 30 minutes of moderate- intensity exercise 5 times weekly  Discussed gradually increasing exercise  Pt states his new insurance has fitness benefit  Pt HgbA1c improved on Tradjenta and Rybelsus  Take BG first thing in the morning before eating  Plan -Continue current medications  -Would still consider Farxiga instead of Tradjenta for kidney protective benefits and BG lowering.Have discussed with PCP via in basket message. PCP may address at office visit  Hypertension   Office blood pressures are  BP Readings from Last 3 Encounters:  10/03/19 139/90  08/26/19 132/90  08/26/19 132/90   Patient has failed these meds in the past: Amlodipine, diltiazem ER, losartan/HCTZ, Metoprolol succinate, olmesartan Patient is currently controlled on the following medications:   Metoprolol tartrate '50mg'$  daily  Olmesartan/ HCTZ 40/'25mg'$  daily  Patient checks BP at home infrequently  Patient home BP readings are ranging: 130-160/80-90  Plan -Continue current medications   Hyperlipidemia   Lipid Panel     Component Value Date/Time   CHOL 187 08/26/2019 1542   TRIG 270 (H) 08/26/2019 1542   HDL 34 (L) 08/26/2019 1542   CHOLHDL 5.5 (H) 08/26/2019 1542   CHOLHDL 6.2 04/19/2008 0430   VLDL 26 04/19/2008 0430   LDLCALC 106 (H) 08/26/2019 1542   LABVLDL 47 (H) 08/26/2019 1542     The 10-year ASCVD risk score Mikey Bussing  DC Jr., et al., 2013) is: 35%   Values used to calculate the score:     Age: 41 years     Sex: Male     Is Non-Hispanic African American: No     Diabetic: Yes     Tobacco smoker: No     Systolic Blood Pressure: 841 mmHg     Is BP treated: Yes     HDL Cholesterol: 34 mg/dL     Total Cholesterol: 187 mg/dL   Patient has failed these meds in past: N/A Patient is currently uncontrolled on the following medications:  -Atorvastatin '20mg'$  daily -Aspirin 81 mg daily  We discussed:    Diet and exercise extensively  Exercising and decreasing red meat can decrease LDL  Plan -Continue current medications  -Consider increasing atorvastatin to '40mg'$  daily to help reduce LDL  Insomnia   Patient has failed these meds in past: Zolpidem Patient is currently controlled on the following medications:   Trazodone '50mg'$  daily at bedtime  We discussed:  Has been sleeping pretty good  Only takes trazodone as needed when he feels restlessness  Plan -Continue current medications  Overactive Bladder    Patient has failed these meds in past: N/A Patient is currently controlled on the following medications:   Myrbetriq '25mg'$  daily  We discussed:   Myrbetriq is helping. Bladder is able to get full before he goes to the bathroom. Doesn't have to go frequently anymore.   Plan -Continue current medications   CKD   Kidney Function Lab Results  Component Value Date/Time   CREATININE 1.62 (H) 10/03/2019 01:59 AM   CREATININE 1.71 (H) 10/02/2019 07:10 PM   GFRNONAA 44 (L) 10/03/2019 01:59 AM   GFRAA 51 (L) 10/03/2019 01:59 AM   K 4.9 10/03/2019 01:59 AM   K 5.1 10/02/2019 07:10 PM   Patient has failed these meds in past:  Patient is currently uncontrolled on the following medications: N/A  We discussed:  Pt states that he has 15% function in right kidney, but that doctors did not recommend surgery to remove scar tissue because of minimum benefit  Pt states that left kidney is doing  good  Plan Consider starting Farxiga for CKD and also diabetes  Medication Management   [facility] Uses pill box? No - Pt has them organized in cabinet and that works for him Pt endorses 100% compliance  We discussed:   Importance of medication adherence  Pt has new insurance beginning 09/16/19, will perform medication cost review at next follow up  Benefits of medication synchronization and adherence packaging  Plan -Continue current medication management strategy   Follow up: 2 month phone visit  Jannette Fogo, PharmD Clinical Pharmacist [facility] [phone]

## 2019-09-13 ENCOUNTER — Telehealth: Payer: Self-pay

## 2019-09-13 ENCOUNTER — Other Ambulatory Visit: Payer: Self-pay

## 2019-09-13 ENCOUNTER — Ambulatory Visit (INDEPENDENT_AMBULATORY_CARE_PROVIDER_SITE_OTHER): Payer: Medicare (Managed Care)

## 2019-09-13 DIAGNOSIS — I10 Essential (primary) hypertension: Secondary | ICD-10-CM

## 2019-09-13 DIAGNOSIS — E1121 Type 2 diabetes mellitus with diabetic nephropathy: Secondary | ICD-10-CM | POA: Diagnosis not present

## 2019-09-13 DIAGNOSIS — N1831 Chronic kidney disease, stage 3a: Secondary | ICD-10-CM

## 2019-09-17 NOTE — Chronic Care Management (AMB) (Signed)
Chronic Care Management   Follow Up Note   10/14/2019 Name: [name] MRN: [id number] DOB: November 10, 1955  Referred by: Randy Brine, [name] Reason for referral : Chronic Care Management (FU  RNCM Telephone Follow up-patient ed mats)   [name] is a 64 y.o. year old male who is a primary care patient of [name], [place]. The CCM team was consulted for assistance with chronic disease management and care coordination needs.    Review of patient status, including review of consultants reports, relevant laboratory and other test results, and collaboration with appropriate care team members and the patient's provider was performed as part of comprehensive patient evaluation and provision of chronic care management services.    SDOH (Social Determinants of Health) assessments performed: Yes - see care plan  See Care Plan activities for detailed interventions related to [place])   Placed outbound call to patient for a CCM RN CM follow up call.     Outpatient Encounter Medications as of 09/13/2019  Medication Sig  . acetaminophen (TYLENOL) 500 MG tablet Take 500 mg by mouth every 6 (six) hours as needed for mild pain or moderate pain.  Marland Kitchen aspirin EC 81 MG tablet Take 81 mg by mouth daily.  Marland Kitchen atorvastatin (LIPITOR) 20 MG tablet TAKE 1 TABLET BY MOUTH EVERY DAY  . fluconazole (DIFLUCAN) 100 MG tablet Take 1 tablet (100 mg total) by mouth daily. Take 1 tablet by mouth now repeat in 5 days  . guaiFENesin (MUCINEX) 600 MG 12 hr tablet Take 1 tablet (600 mg total) by mouth 2 (two) times daily.  . metoprolol tartrate (LOPRESSOR) 50 MG tablet TAKE 1 TABLET BY MOUTH EVERY DAY  . Multiple Vitamin (MULTIVITAMIN) tablet Take 1 tablet by mouth daily.  Marland Kitchen MYRBETRIQ 25 MG TB24 tablet TAKE 1 TABLET BY MOUTH EVERY DAY  . nystatin (NYSTATIN) powder Apply 1 application topically 3 (three) times daily.  Marland Kitchen olmesartan-hydrochlorothiazide (BENICAR HCT) 40-25 MG tablet TAKE 1 TABLET BY MOUTH EVERY DAY  . Semaglutide  (RYBELSUS) 14 MG TABS Take 1 tablet by mouth daily. 30 minutes before breakfast  . sildenafil (REVATIO) 20 MG tablet TAKE 1 TABLET BY MOUTH EVERY DAY (PRIOR AUTHORIZATION DENIED)  . TRADJENTA 5 MG TABS tablet TAKE 1 TABLET BY MOUTH EVERY DAY  . traMADol (ULTRAM) 50 MG tablet Take 1 tablet (50 mg total) by mouth every 6 (six) hours as needed for severe pain.  . traZODone (DESYREL) 50 MG tablet TAKE 1 TABLET BY MOUTH EVERYDAY AT BEDTIME   No facility-administered encounter medications on file as of 09/13/2019.     Objective:  Lab Results  Component Value Date   HGBA1C 7.8 (H) 08/26/2019   HGBA1C 9.2 (H) 05/05/2019   HGBA1C 9.4 (H) 02/02/2019   Lab Results  Component Value Date   MICROALBUR 80 08/26/2019   LDLCALC 106 (H) 08/26/2019   CREATININE 1.76 (H) 08/26/2019   BP Readings from Last 3 Encounters:  08/26/19 132/90  08/26/19 132/90  05/05/19 122/84    Goals Addressed      Patient Stated   .  "I need help getting into an assisted living facility" (pt-stated)        [place] (see longitudinal plan of care for additional care plan information)  Current Barriers:  Marland Kitchen Knowledge Deficits related to process for admission into an ALF . Chronic Disease Management support and education needs related to CKD III, DM II, HTN  Nurse Case Manager Clinical Goal(s):  Marland Kitchen Over the next  90 days, patient will work with embedded BSW and PCP to address needs related to assistance for admission into an ALF  CCM RN CM Interventions:  . Inter-disciplinary care team collaboration (see longitudinal plan of care) . Determined patient would like to move into an ALF and would like assistance from the embedded BSW and PCP . Collaborated with embedded BSW [name] regarding patient's request for assistance with admission into an ALF . Discussed plans with patient for ongoing care management follow up and provided patient with direct contact information for care management team  Patient Self  Care Activities:  . Self administers medications as prescribed . Attends all scheduled provider appointments . Calls pharmacy for medication refills . Calls provider office for new concerns or questions  Initial goal documentation     .  "I would like to lower my A1C" (pt-stated)   On track     Current Barriers:  Marland Kitchen Knowledge Deficits related to disease process and Self Health management of DM . Chronic Disease Management support and education needs related to DMII, HTN, CKDIII  Nurse Case Manager Clinical Goal(s):  Marland Kitchen Over the next 90 days, patient will work with the [place] CM and PCP to address needs related to disease management and support for improved Self Health management of Diabetes . Over the next 90 days, patient will lower his A1C <9.4  CCM RN CM Interventions:  09/14/19 call completed with patient  . Evaluation of current treatment plan related to Diabetes and patient's adherence to plan as established by provider. . Provided education to patient re: A1c is down from 9.4 to 7.8 % obtained on 08/26/19; Reinforced the target A1C is <7.0; Education provided on how to achieve this goal, including medication adherence, diet adherence, implementation of daily/weekly exercise . Reviewed medications with patient and discussed indication , dosage and frequency of diabetic medications and patient verbalizes understanding, he reports having good adherence without missed doses . Sent Pharm D referral for evaluation of medication management and adherence for chronic care conditions . Advised patient, providing education and rationale, to check cbg daily before meals and record, calling the PCP and or CCM team for findings outside established parameters . Discussed plans with patient for ongoing care management follow up and provided patient with direct contact information for care management team  PharmD interventions: Completed call with patient on 05/10/19 -Patient started on GLP-1 BY MOUTH  (Rybelsus).  He is to take this with a full glass of water in the AM 2-3 hrs separate from other medications.  -Patient is not checking Bgs at home.  I strongly encouraged patient to check Bgs at home in order for [place] to be able to adjust his medications for optimal outcomes -Metformin has been discontinued by nephrologist due to kidneys -He remains on Tradjenta for diabetes (this medication does not require renal adjustments)  Patient Self Care Activities:  . Attends all scheduled provider appointments . Calls pharmacy for medication refills . Calls provider office for new concerns or questions  Please see past updates related to this goal by clicking on the "Past Updates" button in the selected goal      .  "I would like to save my kidneys" (pt-stated)   On track     Current Barriers:  Marland Kitchen Knowledge Deficits related to disease process and Self Health management of CKD . Chronic Disease Management support and education needs related to CKDIII, HTN, DMII  Nurse Case Manager Clinical Goal(s):  Marland Kitchen Over the next  90 days, patient will work with CCM RN and Nephrologist to address needs related to disease education and support to improve Self Health management of CKD  CCM RN CM Interventions:  09/14/19 call completed with patient  . Evaluation of current treatment plan related to CKD and patient's adherence to plan as established by provider. . Reinforced education provided to patient re: potential complications from uncontrolled diabetes and High Blood Pressure resulting in kidney damage; educated patient on the stages of CKD and determined patient is being following by a Nephrologist for management and monitoring of this condition  . 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 Determined patient is making efforts by  adhering to his prescribed treatment plan to improve the Self Health management of his CKD and has lowered his A1c  . Discussed plans with patient for ongoing care management follow up and provided patient with direct contact information for care management team . Provided patient with printed educational materials related to [place] for Kidney patients; Living with Diabetes and Kidney Disease; Eating Right with Kidney Disease  Patient Self Care Activities:  . Self administers medications as prescribed . Attends all scheduled provider appointments . Calls pharmacy for medication refills . Calls provider office for new concerns or questions  Please see past updates related to this goal by clicking on the "Past Updates" button in the selected goal         Plan:   Telephone follow up appointment with care management team member scheduled for: 10/28/19  Barb Merino, RN, BSN, CCM Care Management Coordinator [place] [facility]  Direct Phone: [phone]

## 2019-09-17 NOTE — Patient Instructions (Signed)
Visit Information  Goals Addressed      Patient Stated   .  "I need help getting into an assisted living facility" (pt-stated)        [name] (see longitudinal plan of care for additional care plan information)  Current Barriers:  Marland Kitchen Knowledge Deficits related to process for admission into an ALF . Chronic Disease Management support and education needs related to CKD III, DM II, HTN  Nurse Case Manager Clinical Goal(s):  Marland Kitchen Over the next 90 days, patient will work with embedded BSW and PCP to address needs related to assistance for admission into an ALF  CCM RN CM Interventions:  . Inter-disciplinary care team collaboration (see longitudinal plan of care) . Determined patient would like to move into an ALF and would like assistance from the embedded BSW and PCP . Collaborated with embedded BSW [name] regarding patient's request for assistance with admission into an ALF . Discussed plans with patient for ongoing care management follow up and provided patient with direct contact information for care management team  Patient Self Care Activities:  . Self administers medications as prescribed . Attends all scheduled provider appointments . Calls pharmacy for medication refills . Calls provider office for new concerns or questions  Initial goal documentation     .  "I would like to lower my A1C" (pt-stated)   On track     Current Barriers:  Marland Kitchen Knowledge Deficits related to disease process and Self Health management of DM . Chronic Disease Management support and education needs related to DMII, HTN, CKDIII  Nurse Case Manager Clinical Goal(s):  Marland Kitchen Over the next 90 days, patient will work with the [place] CM and PCP to address needs related to disease management and support for improved Self Health management of Diabetes . Over the next 90 days, patient will lower his A1C <9.4  CCM RN CM Interventions:  09/14/19 call completed with patient  . Evaluation of current  treatment plan related to Diabetes and patient's adherence to plan as established by provider. . Provided education to patient re: A1c is down from 9.4 to 7.8 % obtained on 08/26/19; Reinforced the target A1C is <7.0; Education provided on how to achieve this goal, including medication adherence, diet adherence, implementation of daily/weekly exercise . Reviewed medications with patient and discussed indication , dosage and frequency of diabetic medications and patient verbalizes understanding, he reports having good adherence without missed doses . Sent Pharm D referral for evaluation of medication management and adherence for chronic care conditions . Advised patient, providing education and rationale, to check cbg daily before meals and record, calling the PCP and or CCM team for findings outside established parameters . Discussed plans with patient for ongoing care management follow up and provided patient with direct contact information for care management team  PharmD interventions: Completed call with patient on 05/10/19 -Patient started on GLP-1 BY MOUTH (Rybelsus).  He is to take this with a full glass of water in the AM 2-3 hrs separate from other medications.  -Patient is not checking Bgs at home.  I strongly encouraged patient to check Bgs at home in order for [place] to be able to adjust his medications for optimal outcomes -Metformin has been discontinued by nephrologist due to kidneys -He remains on Tradjenta for diabetes (this medication does not require renal adjustments)  Patient Self Care Activities:  . Attends all scheduled provider appointments . Calls pharmacy for medication refills . Calls provider office for new  concerns or questions   Please see past updates related to this goal by clicking on the "Past Updates" button in the selected goal      .  "I would like to save my kidneys" (pt-stated)   On track     Current Barriers:  Marland Kitchen Knowledge Deficits related to disease process  and Self Health management of CKD . Chronic Disease Management support and education needs related to CKDIII, HTN, DMII  Nurse Case Manager Clinical Goal(s):  Marland Kitchen Over the next 90 days, patient will work with CCM RN and Nephrologist to address needs related to disease education and support to improve Self Health management of CKD  CCM RN CM Interventions:  09/14/19 call completed with patient  . Evaluation of current treatment plan related to CKD and patient's adherence to plan as established by provider. . Reinforced education provided to patient re: potential complications from uncontrolled diabetes and High Blood Pressure resulting in kidney damage; educated patient on the stages of CKD and determined patient is being following by a Nephrologist for management and monitoring of this condition  . 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 Determined patient is making efforts by adhering to his prescribed treatment plan to improve the Self Health management of his CKD and has lowered his A1c  . Discussed plans with patient for ongoing care management follow up and provided patient with direct contact information for care management team . Provided patient with printed educational materials related to [place] for Kidney patients; Living with Diabetes and Kidney Disease; Eating Right with Kidney Disease  Patient Self Care Activities:  . Self administers medications as prescribed . Attends all scheduled provider appointments . Calls pharmacy for medication refills . Calls provider office for new concerns or questions  Please see past updates related to this goal by clicking on the "Past Updates" button in the selected goal        Patient verbalizes understanding of instructions provided today.   Telephone  follow up appointment with care management team member scheduled for: 10/28/19  Barb Merino, RN, BSN, CCM Care Management Coordinator [facility]  Direct Phone: [phone]

## 2019-09-18 DIAGNOSIS — Z23 Encounter for immunization: Secondary | ICD-10-CM | POA: Diagnosis not present

## 2019-09-21 ENCOUNTER — Telehealth: Payer: Self-pay | Admitting: *Deleted

## 2019-09-21 NOTE — Chronic Care Management (AMB) (Signed)
°  Chronic Care Management   Note  09/21/2019 Name: [name] MRN: [id number] DOB: [birth year]  [name] is a 64 y.o. year old male who is a primary care patient of [name], [place] and is actively engaged with the care management team. I reached out to [name] by phone today to assist with scheduling a follow up visit with the Pharmacist for mid August.   Follow up plan: Unsuccessful telephone outreach attempt made. A HIPPA compliant phone message was left for the patient providing contact information and requesting a return call. The care management team will reach out to the patient again over the next 7 days. If patient returns call to provider office, please advise to call [place] at [phone].  [place], [place] [address] Direct Dial: [phone] [email or web address]: [email or web address]

## 2019-09-22 ENCOUNTER — Other Ambulatory Visit: Payer: Self-pay

## 2019-09-22 ENCOUNTER — Ambulatory Visit (INDEPENDENT_AMBULATORY_CARE_PROVIDER_SITE_OTHER): Payer: Medicare Other

## 2019-09-22 ENCOUNTER — Telehealth: Payer: Medicare Other

## 2019-09-22 ENCOUNTER — Ambulatory Visit: Payer: Self-pay

## 2019-09-22 DIAGNOSIS — E1121 Type 2 diabetes mellitus with diabetic nephropathy: Secondary | ICD-10-CM | POA: Diagnosis not present

## 2019-09-22 DIAGNOSIS — N1831 Chronic kidney disease, stage 3a: Secondary | ICD-10-CM

## 2019-09-22 DIAGNOSIS — I10 Essential (primary) hypertension: Secondary | ICD-10-CM

## 2019-09-22 NOTE — Patient Instructions (Signed)
Social Worker Visit Information  Goals we discussed today:  Goals Addressed              This Visit's Progress     Patient Stated   .  "I need help getting into an assisted living facility" (pt-stated)   On track     [name] (see longitudinal plan of care for additional care plan information)  Current Barriers:  Marland Kitchen Knowledge Deficits related to process for admission into an ALF . Chronic Disease Management support and education needs related to CKD III, DM II, HTN  Nurse Case Manager Clinical Goal(s):  Marland Kitchen Over the next 90 days, patient will work with embedded BSW and PCP to address needs related to assistance for admission into an ALF  CCM SW Interventions: Completed 09/22/19 . Collaboration with RN Care Manager who reports patient interested in being placed in an ALF . Performed chart review to note patient has dual coverage . Successful outbound call placed to the patient who reports interest in [facility] o Patient reports he has contacted this facility to confirm beds are available o Patient reports he was informed he would have to received COVID 19 vaccine in order to move into facility; patient received first dose of Moderna Friday 7/2 and has second vaccine scheduled for 7/31 . Advised the patient SW would outreach [facility] to discuss next steps for the patient to be placed . Successful outbound call placed to [facility], spoke with Stanton Kidney who requests FL-2 and updated H&P be faxed for review to [phone] . Collaboration with the patients primary provider to request assistance with paperwork  o Initiated Fl-2 form for provider completion . Scheduled follow up to the patient over the next 3 days  CCM RN CM Interventions:  . Inter-disciplinary care team collaboration (see longitudinal plan of care) . Determined patient would like to move into an ALF and would like assistance from the embedded BSW and PCP . Collaborated with embedded BSW [name]  regarding patient's request for assistance with admission into an ALF . Discussed plans with patient for ongoing care management follow up and provided patient with direct contact information for care management team  Patient Self Care Activities:  . Self administers medications as prescribed . Attends all scheduled provider appointments . Calls pharmacy for medication refills . Calls provider office for new concerns or questions  Please see past updates related to this goal by clicking on the "Past Updates" button in the selected goal        Follow Up Plan: SW will follow up with patient by phone over the next 3 days.   [name], BSW, CDP Social Worker, Certified Dementia Practitioner [place] [facility] [phone]

## 2019-09-22 NOTE — Chronic Care Management (AMB) (Signed)
Chronic Care Management   Follow Up Note   09/22/2019 Name: [name] MRN: [id number] DOB: 04/24/1955  Referred by: [name], [name] Reason for referral : Chronic Care Management (FU RN CM Call )   [name] is a 64 y.o. year old male who is a primary care patient of [name], [place]. The CCM team was consulted for assistance with chronic disease management and care coordination needs.    Review of patient status, including review of consultants reports, relevant laboratory and other test results, and collaboration with appropriate care team members and the patient's provider was performed as part of comprehensive patient evaluation and provision of chronic care management services.    SDOH (Social Determinants of Health) assessments performed: No See Care Plan activities for detailed interventions related to [place])   Placed outbound CCM RN CM call to patient to follow up on his c/o nausea, vomiting and severe fatigue.     Outpatient Encounter Medications as of 09/22/2019  Medication Sig  . acetaminophen (TYLENOL) 500 MG tablet Take 500 mg by mouth every 6 (six) hours as needed for mild pain or moderate pain.  Marland Kitchen aspirin EC 81 MG tablet Take 81 mg by mouth daily.  Marland Kitchen atorvastatin (LIPITOR) 20 MG tablet TAKE 1 TABLET BY MOUTH EVERY DAY  . fluconazole (DIFLUCAN) 100 MG tablet Take 1 tablet (100 mg total) by mouth daily. Take 1 tablet by mouth now repeat in 5 days  . guaiFENesin (MUCINEX) 600 MG 12 hr tablet Take 1 tablet (600 mg total) by mouth 2 (two) times daily.  . metoprolol tartrate (LOPRESSOR) 50 MG tablet TAKE 1 TABLET BY MOUTH EVERY DAY  . Multiple Vitamin (MULTIVITAMIN) tablet Take 1 tablet by mouth daily.  Marland Kitchen MYRBETRIQ 25 MG TB24 tablet TAKE 1 TABLET BY MOUTH EVERY DAY  . nystatin (NYSTATIN) powder Apply 1 application topically 3 (three) times daily.  Marland Kitchen olmesartan-hydrochlorothiazide (BENICAR HCT) 40-25 MG tablet TAKE 1 TABLET BY MOUTH EVERY DAY  . Semaglutide (RYBELSUS)  14 MG TABS Take 1 tablet by mouth daily. 30 minutes before breakfast  . sildenafil (REVATIO) 20 MG tablet TAKE 1 TABLET BY MOUTH EVERY DAY (PRIOR AUTHORIZATION DENIED)  . TRADJENTA 5 MG TABS tablet TAKE 1 TABLET BY MOUTH EVERY DAY  . traMADol (ULTRAM) 50 MG tablet Take 1 tablet (50 mg total) by mouth every 6 (six) hours as needed for severe pain.  . traZODone (DESYREL) 50 MG tablet TAKE 1 TABLET BY MOUTH EVERYDAY AT BEDTIME   No facility-administered encounter medications on file as of 09/22/2019.     Objective:  Lab Results  Component Value Date   HGBA1C 7.8 (H) 08/26/2019   HGBA1C 9.2 (H) 05/05/2019   HGBA1C 9.4 (H) 02/02/2019   Lab Results  Component Value Date   MICROALBUR 80 08/26/2019   LDLCALC 106 (H) 08/26/2019   CREATININE 1.76 (H) 08/26/2019   BP Readings from Last 3 Encounters:  08/26/19 132/90  08/26/19 132/90  05/05/19 122/84    Goals Addressed      Patient Stated   .  "to feel better and stop vomiting" (pt-stated)        [place] (see longitudinal plan of care for additional care plan information)  Current Barriers:  Marland Kitchen Knowledge Deficits related to diagnosis and treatment of acute symptoms of nausea, vomiting and fatigue . Chronic Disease Management support and education needs related to CKD III, DM II, HTN  Nurse Case Manager Clinical Goal(s):  Marland Kitchen Over the next  7 days, patient will work with the CCM team and PCP to address needs related to acute symptoms of nausea, vomiting and fatigue  CCM RN CM Interventions:  09/22/19 call completed with patient  . Inter-disciplinary care team collaboration (see longitudinal plan of care) . Evaluation of current treatment plan related to nausea, vomiting and fatigue and patient's adherence to plan as established by provider . Determined patient received his 1st COVID 19 vaccine on 09/17/19 and by the following day he developed nausea, intermittent vomiting, decreased appetite and severe fatigue, these symptoms have  been persistent over the past 5 days, patient states, "I just don't feel good at all" . Advised patient to go to the nearest ED for evaluation and treatment of nausea, vomiting and fatigue . Provided education to patient re: potential complications secondary to uncontrolled and untreated persistent vomiting . Determined patient will ask his friend who is stopping by to visit to take him to the ED . Collaborated with PCP provider [name], [name] regarding patient's c/o persistent nausea, vomiting and fatigue x 5 days; Advised Mr. Guidice reports these symptoms started after receiving his 1st COVID 19 vaccine  . Discussed plans with patient for ongoing care management follow up and provided patient with direct contact information for care management team  Patient Self Care Activities:  . Self administers medications as prescribed . Attends all scheduled provider appointments . Calls pharmacy for medication refills . Calls provider office for new concerns or questions  Initial goal documentation       Plan:   Telephone follow up appointment with care management team member scheduled for: 09/23/19  Barb Merino, RN, BSN, CCM Care Management Coordinator [facility] [facility]  Direct Phone: [phone]

## 2019-09-22 NOTE — Chronic Care Management (AMB) (Signed)
Chronic Care Management    Social Work Follow Up Note  09/22/2019 Name: [name] MRN: [id number] DOB: 1956-01-10  [name] is a 63 y.o. year old male who is a primary care patient of [name], [place]. The CCM team was consulted for assistance with care coordination.   Review of patient status, including review of consultants reports, other relevant assessments, and collaboration with appropriate care team members and the patient's provider was performed as part of comprehensive patient evaluation and provision of chronic care management services.    SDOH (Social Determinants of Health) assessments performed: No    Outpatient Encounter Medications as of 09/22/2019  Medication Sig  . acetaminophen (TYLENOL) 500 MG tablet Take 500 mg by mouth every 6 (six) hours as needed for mild pain or moderate pain.  Marland Kitchen aspirin EC 81 MG tablet Take 81 mg by mouth daily.  Marland Kitchen atorvastatin (LIPITOR) 20 MG tablet TAKE 1 TABLET BY MOUTH EVERY DAY  . fluconazole (DIFLUCAN) 100 MG tablet Take 1 tablet (100 mg total) by mouth daily. Take 1 tablet by mouth now repeat in 5 days  . guaiFENesin (MUCINEX) 600 MG 12 hr tablet Take 1 tablet (600 mg total) by mouth 2 (two) times daily.  . metoprolol tartrate (LOPRESSOR) 50 MG tablet TAKE 1 TABLET BY MOUTH EVERY DAY  . Multiple Vitamin (MULTIVITAMIN) tablet Take 1 tablet by mouth daily.  Marland Kitchen MYRBETRIQ 25 MG TB24 tablet TAKE 1 TABLET BY MOUTH EVERY DAY  . nystatin (NYSTATIN) powder Apply 1 application topically 3 (three) times daily.  Marland Kitchen olmesartan-hydrochlorothiazide (BENICAR HCT) 40-25 MG tablet TAKE 1 TABLET BY MOUTH EVERY DAY  . Semaglutide (RYBELSUS) 14 MG TABS Take 1 tablet by mouth daily. 30 minutes before breakfast  . sildenafil (REVATIO) 20 MG tablet TAKE 1 TABLET BY MOUTH EVERY DAY (PRIOR AUTHORIZATION DENIED)  . TRADJENTA 5 MG TABS tablet TAKE 1 TABLET BY MOUTH EVERY DAY  . traMADol (ULTRAM) 50 MG tablet Take 1 tablet (50 mg total) by mouth every 6 (six)  hours as needed for severe pain.  . traZODone (DESYREL) 50 MG tablet TAKE 1 TABLET BY MOUTH EVERYDAY AT BEDTIME   No facility-administered encounter medications on file as of 09/22/2019.     Goals Addressed              This Visit's Progress     Patient Stated   .  "I need help getting into an assisted living facility" (pt-stated)   On track     [place] (see longitudinal plan of care for additional care plan information)  Current Barriers:  Marland Kitchen Knowledge Deficits related to process for admission into an ALF . Chronic Disease Management support and education needs related to CKD III, DM II, HTN  Nurse Case Manager Clinical Goal(s):  Marland Kitchen Over the next 90 days, patient will work with embedded BSW and PCP to address needs related to assistance for admission into an ALF  CCM SW Interventions: Completed 09/22/19 . Collaboration with RN Care Manager who reports patient interested in being placed in an ALF . Performed chart review to note patient has dual coverage . Successful outbound call placed to the patient who reports interest in [facility] o Patient reports he has contacted this facility to confirm beds are available o Patient reports he was informed he would have to received COVID 19 vaccine in order to move into facility; patient received first dose of Moderna Friday 7/2 and has second vaccine scheduled for 7/31 .  Advised the patient SW would outreach [facility] to discuss next steps for the patient to be placed . Successful outbound call placed to [facility], spoke with Stanton Kidney who requests FL-2 and updated H&P be faxed for review to [phone] . Collaboration with the patients primary provider to request assistance with paperwork  o Initiated Fl-2 form for provider completion . Scheduled follow up to the patient over the next 3 days  CCM RN CM Interventions:  . Inter-disciplinary care team collaboration (see longitudinal plan of care) . Determined patient would  like to move into an ALF and would like assistance from the embedded BSW and PCP . Collaborated with embedded BSW [name] regarding patient's request for assistance with admission into an ALF . Discussed plans with patient for ongoing care management follow up and provided patient with direct contact information for care management team  Patient Self Care Activities:  . Self administers medications as prescribed . Attends all scheduled provider appointments . Calls pharmacy for medication refills . Calls provider office for new concerns or questions  Please see past updates related to this goal by clicking on the "Past Updates" button in the selected goal          Follow Up Plan: Patient reports concern with nausea and vomiting since receiving COVID 19 vaccine. SW collaborated with PCP and Consulting civil engineer who plans to contact the patient. SW will follow up with the patient to discuss placement progress over the next 3 days.   [name], BSW, CDP Social Worker, Certified Dementia Practitioner [place] [facility] [place] [facility] [phone]  Total time spent performing care coordination and/or care management activities with the patient by phone or face to face = 45 minutes.

## 2019-09-22 NOTE — Patient Instructions (Signed)
Visit Information  Goals Addressed      Patient Stated   .  "to feel better and stop vomiting" (pt-stated)        CARE PLAN ENTRY (see longitudinal plan of care for additional care plan information)  Current Barriers:  Marland Kitchen Knowledge Deficits related to diagnosis and treatment of acute symptoms of nausea, vomiting and fatigue . Chronic Disease Management support and education needs related to CKD III, DM II, HTN  Nurse Case Manager Clinical Goal(s):  Marland Kitchen Over the next 7 days, patient will work with the CCM team and PCP to address needs related to acute symptoms of nausea, vomiting and fatigue  CCM RN CM Interventions:  09/22/19 call completed with patient  . Inter-disciplinary care team collaboration (see longitudinal plan of care) . Evaluation of current treatment plan related to nausea, vomiting and fatigue and patient's adherence to plan as established by provider . Determined patient received his 1st COVID 19 vaccine on 09/17/19 and by the following day he developed nausea, intermittent vomiting, decreased appetite and severe fatigue, these symptoms have been persistent over the past 5 days, patient states, "I just don't feel good at all" . Advised patient to go to the nearest ED for evaluation and treatment of nausea, vomiting and fatigue . Provided education to patient re: potential complications secondary to uncontrolled and untreated persistent vomiting . Determined patient will ask his friend who is stopping by to visit to take him to the ED . Collaborated with PCP provider [name], [name] regarding patient's c/o persistent nausea, vomiting and fatigue x 5 days; Advised Mr. Beitler reports these symptoms started after receiving his 1st COVID 19 vaccine  . Discussed plans with patient for ongoing care management follow up and provided patient with direct contact information for care management team  Patient Self Care Activities:  . Self administers medications as prescribed . Attends all  scheduled provider appointments . Calls pharmacy for medication refills . Calls provider office for new concerns or questions  Initial goal documentation       Patient verbalizes understanding of instructions provided today.   Telephone follow up appointment with care management team member scheduled for: 09/23/19  Barb Merino, RN, BSN, CCM Care Management Coordinator [place] [facility]  Direct Phone: [phone]

## 2019-09-23 ENCOUNTER — Telehealth: Payer: Medicare Other

## 2019-09-23 ENCOUNTER — Other Ambulatory Visit: Payer: Self-pay | Admitting: Nurse Practitioner

## 2019-09-23 ENCOUNTER — Ambulatory Visit: Payer: Self-pay

## 2019-09-23 DIAGNOSIS — E1121 Type 2 diabetes mellitus with diabetic nephropathy: Secondary | ICD-10-CM

## 2019-09-23 DIAGNOSIS — N1831 Chronic kidney disease, stage 3a: Secondary | ICD-10-CM

## 2019-09-23 DIAGNOSIS — I10 Essential (primary) hypertension: Secondary | ICD-10-CM

## 2019-09-23 NOTE — Chronic Care Management (AMB) (Signed)
  Chronic Care Management   Note  09/23/2019 Name: CLIF SERIO MRN: [id number] DOB: 01/23/1956  VEDANSH KERSTETTER is a 64 y.o. year old male who is a primary care patient of [name], [place] and is actively engaged with the care management team. I reached out to [name] by phone today to assist with scheduling a follow up visit with the Pharmacist.  Follow up plan: Telephone appointment with care management team member scheduled for: 11/04/2019.  [place], [place] [address] Direct Dial: [phone] [email or web address]: [email or web address]

## 2019-09-23 NOTE — Chronic Care Management (AMB) (Signed)
  Chronic Care Management   Outreach Note  09/23/2019 Name: [name] MRN: [id number] DOB: Jan 11, 1956  Referred by: [name], [name] Reason for referral : Chronic Care Management (FU RN CM Call )   An unsuccessful telephone outreach was attempted today. The patient was referred to the case management team for assistance with care management and care coordination.   Follow Up Plan: Telephone follow up appointment with care management team member scheduled for: 09/24/19  Barb Merino, RN, BSN, CCM Care Management Coordinator [place] [facility]  Direct Phone: [phone]

## 2019-09-24 ENCOUNTER — Encounter: Payer: Medicare (Managed Care) | Admitting: Gastroenterology

## 2019-09-24 ENCOUNTER — Ambulatory Visit: Payer: Medicare Other

## 2019-09-24 DIAGNOSIS — N1831 Chronic kidney disease, stage 3a: Secondary | ICD-10-CM

## 2019-09-24 DIAGNOSIS — E1121 Type 2 diabetes mellitus with diabetic nephropathy: Secondary | ICD-10-CM

## 2019-09-24 DIAGNOSIS — I10 Essential (primary) hypertension: Secondary | ICD-10-CM

## 2019-09-24 NOTE — Chronic Care Management (AMB) (Signed)
Chronic Care Management    Social Work Follow Up Note  09/24/2019 Name: [name] MRN: [id number] DOB: 08-Feb-1956  [name] is a 64 y.o. year old male who is a primary care patient of [name], [name]. The CCM team was consulted for assistance with care coordination.   Review of patient status, including review of consultants reports, other relevant assessments, and collaboration with appropriate care team members and the patient's provider was performed as part of comprehensive patient evaluation and provision of chronic care management services.    SDOH (Social Determinants of Health) assessments performed: No    Outpatient Encounter Medications as of 09/24/2019  Medication Sig  . acetaminophen (TYLENOL) 500 MG tablet Take 500 mg by mouth every 6 (six) hours as needed for mild pain or moderate pain.  Marland Kitchen aspirin EC 81 MG tablet Take 81 mg by mouth daily.  Marland Kitchen atorvastatin (LIPITOR) 20 MG tablet TAKE 1 TABLET BY MOUTH EVERY DAY  . fluconazole (DIFLUCAN) 100 MG tablet Take 1 tablet (100 mg total) by mouth daily. Take 1 tablet by mouth now repeat in 5 days  . guaiFENesin (MUCINEX) 600 MG 12 hr tablet Take 1 tablet (600 mg total) by mouth 2 (two) times daily.  . metoprolol tartrate (LOPRESSOR) 50 MG tablet TAKE 1 TABLET BY MOUTH EVERY DAY  . Multiple Vitamin (MULTIVITAMIN) tablet Take 1 tablet by mouth daily.  Marland Kitchen MYRBETRIQ 25 MG TB24 tablet TAKE 1 TABLET BY MOUTH EVERY DAY  . nystatin (NYSTATIN) powder Apply 1 application topically 3 (three) times daily.  Marland Kitchen olmesartan-hydrochlorothiazide (BENICAR HCT) 40-25 MG tablet TAKE 1 TABLET BY MOUTH EVERY DAY  . RYBELSUS 14 MG TABS TAKE 1 TABLET BY MOUTH DAILY. 30 MINUTES BEFORE BREAKFAST  . sildenafil (REVATIO) 20 MG tablet TAKE 1 TABLET BY MOUTH EVERY DAY (PRIOR AUTHORIZATION DENIED)  . TRADJENTA 5 MG TABS tablet TAKE 1 TABLET BY MOUTH EVERY DAY  . traMADol (ULTRAM) 50 MG tablet Take 1 tablet (50 mg total) by mouth every 6 (six) hours as needed  for severe pain.  . traZODone (DESYREL) 50 MG tablet TAKE 1 TABLET BY MOUTH EVERYDAY AT BEDTIME   No facility-administered encounter medications on file as of 09/24/2019.     Goals Addressed              This Visit's Progress     Patient Stated   .  "I need help getting into an assisted living facility" (pt-stated)        [place] (see longitudinal plan of care for additional care plan information)  Current Barriers:  Marland Kitchen Knowledge Deficits related to process for admission into an ALF . Chronic Disease Management support and education needs related to CKD III, DM II, HTN  Nurse Case Manager Clinical Goal(s):  Marland Kitchen Over the next 90 days, patient will work with embedded BSW and PCP to address needs related to assistance for admission into an ALF  CCM SW Interventions: Completed 09/24/19 . Successful outbound call placed to the patient to advise paperwork has been sent to provider for completion . Determined the patient is no longer wanting to move into [facility] and is considering SNF versus ALF . Educated the patient on the difference in levels of care . Determined the patient feels he is most appropriate for ALF but would rather look elsewhere . Advised the patient SW would send patient Fl2 to local ALF who accept special assistance to seek bed offer . Collaboration with patient primary provider  to request completed paperwork be sent directly to SW to send out seeking bed offers . SW to continue to follow  CCM RN CM Interventions:  . Inter-disciplinary care team collaboration (see longitudinal plan of care) . Determined patient would like to move into an ALF and would like assistance from the embedded BSW and PCP . Collaborated with embedded BSW [name] regarding patient's request for assistance with admission into an ALF . Discussed plans with patient for ongoing care management follow up and provided patient with direct contact information for care management  team  Patient Self Care Activities:  . Self administers medications as prescribed . Attends all scheduled provider appointments . Calls pharmacy for medication refills . Calls provider office for new concerns or questions  Please see past updates related to this goal by clicking on the "Past Updates" button in the selected goal      .  "to feel better and stop vomiting" (pt-stated)        [place] (see longitudinal plan of care for additional care plan information)  Current Barriers:  Marland Kitchen Knowledge Deficits related to diagnosis and treatment of acute symptoms of nausea, vomiting and fatigue . Chronic Disease Management support and education needs related to CKD III, DM II, HTN  Nurse Case Manager Clinical Goal(s):  Marland Kitchen Over the next 7 days, patient will work with the CCM team and PCP to address needs related to acute symptoms of nausea, vomiting and fatigue  CCM RN CM Interventions:  09/22/19 call completed with patient  . Inter-disciplinary care team collaboration (see longitudinal plan of care) . Evaluation of current treatment plan related to nausea, vomiting and fatigue and patient's adherence to plan as established by provider . Determined patient received his 1st COVID 19 vaccine on 09/17/19 and by the following day he developed nausea, intermittent vomiting, decreased appetite and severe fatigue, these symptoms have been persistent over the past 5 days, patient states, "I just don't feel good at all" . Advised patient to go to the nearest ED for evaluation and treatment of nausea, vomiting and fatigue . Provided education to patient re: potential complications secondary to uncontrolled and untreated persistent vomiting . Determined patient will ask his friend who is stopping by to visit to take him to the ED . Collaborated with PCP provider Randy Brine, [name] regarding patient's c/o persistent nausea, vomiting and fatigue x 5 days; Advised [name] reports these symptoms started  after receiving his 1st COVID 19 vaccine  . Discussed plans with patient for ongoing care management follow up and provided patient with direct contact information for care management team   CCM SW Interventions: Completed 09/24/19 . Successful outbound call placed to the patient to assess progression of patient goal . Determined the patient has yet to be seen in ED but "may go soon if congestion does not improve" . Patient reports his friend took him to [facility] today where he was able to speak with the pharmacist o The pharmacist informed the patient to check with his medical doctor to determine if the second COVID 19 vaccine would be appropriate to take . Encouraged the patient to seek medical care either by urgent care or ED over the weekend if cough/congestion worsen or do not improve  Patient Self Care Activities:  . Self administers medications as prescribed . Attends all scheduled provider appointments . Calls pharmacy for medication refills . Calls provider office for new concerns or questions  Please see past updates related to this  goal by clicking on the "Past Updates" button in the selected goal          Follow Up Plan: SW will follow up with patient by phone over the next 5 days.   [name], BSW, CDP Social Worker, Certified Dementia Practitioner [place] [facility] [place] [facility] [phone]  Total time spent performing care coordination and/or care management activities with the patient by phone or face to face = 40 minutes.

## 2019-09-24 NOTE — Patient Instructions (Signed)
Social Worker Visit Information  Goals we discussed today:  Goals Addressed              This Visit's Progress     Patient Stated     "I need help getting into an assisted living facility" (pt-stated)        CARE PLAN ENTRY (see longitudinal plan of care for additional care plan information)  Current Barriers:   Knowledge Deficits related to process for admission into an ALF  Chronic Disease Management support and education needs related to CKD III, DM II, HTN  Nurse Case Manager Clinical Goal(s):   Over the next 90 days, patient will work with embedded BSW and PCP to address needs related to assistance for admission into an ALF  CCM SW Interventions: Completed 09/24/19  Successful outbound call placed to the patient to advise paperwork has been sent to provider for completion  Determined the patient is no longer wanting to move into [facility] and is considering SNF versus ALF  Educated the patient on the difference in levels of care  Determined the patient feels he is most appropriate for ALF but would rather look elsewhere  Advised the patient SW would send patient Fl2 to local ALF who accept special assistance to seek bed offer  Collaboration with patient primary provider to request completed paperwork be sent directly to SW to send out seeking bed offers  SW to continue to follow  CCM RN CM Interventions:   Inter-disciplinary care team collaboration (see longitudinal plan of care)  Determined patient would like to move into an ALF and would like assistance from the embedded BSW and PCP  Collaborated with embedded BSW [name] regarding patient's request for assistance with admission into an ALF  Discussed plans with patient for ongoing care management follow up and provided patient with direct contact information for care management team  Patient Self Care Activities:   Self administers medications as prescribed  Attends all scheduled provider  appointments  Calls pharmacy for medication refills  Calls provider office for new concerns or questions  Please see past updates related to this goal by clicking on the "Past Updates" button in the selected goal        "to feel better and stop vomiting" (pt-stated)        CARE PLAN ENTRY (see longitudinal plan of care for additional care plan information)  Current Barriers:   Knowledge Deficits related to diagnosis and treatment of acute symptoms of nausea, vomiting and fatigue  Chronic Disease Management support and education needs related to CKD III, DM II, HTN  Nurse Case Manager Clinical Goal(s):   Over the next 7 days, patient will work with the CCM team and PCP to address needs related to acute symptoms of nausea, vomiting and fatigue  CCM RN CM Interventions:  09/22/19 call completed with patient   Inter-disciplinary care team collaboration (see longitudinal plan of care)  Evaluation of current treatment plan related to nausea, vomiting and fatigue and patient's adherence to plan as established by provider  Determined patient received his 1st COVID 19 vaccine on 09/17/19 and by the following day he developed nausea, intermittent vomiting, decreased appetite and severe fatigue, these symptoms have been persistent over the past 5 days, patient states, "I just don't feel good at all"  Advised patient to go to the nearest ED for evaluation and treatment of nausea, vomiting and fatigue  Provided education to patient re: potential complications secondary to uncontrolled and untreated persistent vomiting  Determined patient will ask his friend who is stopping by to visit to take him to the ED  Collaborated with PCP provider [name], [name] regarding patient's c/o persistent nausea, vomiting and fatigue x 5 days; Advised Mr. Gillentine reports these symptoms started after receiving his 1st COVID 19 vaccine   Discussed plans with patient for ongoing care management follow up and  provided patient with direct contact information for care management team   CCM SW Interventions: Completed 09/24/19  Successful outbound call placed to the patient to assess progression of patient goal  Determined the patient has yet to be seen in ED but "may go soon if congestion does not improve"  Patient reports his friend took him to [facility] today where he was able to speak with the pharmacist o The pharmacist informed the patient to check with his medical doctor to determine if the second COVID 19 vaccine would be appropriate to take  Encouraged the patient to seek medical care either by urgent care or ED over the weekend if cough/congestion worsen or do not improve  Patient Self Care Activities:   Self administers medications as prescribed  Attends all scheduled provider appointments  Calls pharmacy for medication refills  Calls provider office for new concerns or questions  Please see past updates related to this goal by clicking on the "Past Updates" button in the selected goal          Materials Provided: Verbal education about levels of care provided by phone  Follow Up Plan: SW will follow up with patient by phone over the next 5 days.   [name], BSW, CDP Social Worker, Certified Dementia Practitioner [place] [facility] [place] [facility] [phone]

## 2019-09-28 ENCOUNTER — Emergency Department (HOSPITAL_COMMUNITY): Payer: Medicare Other

## 2019-09-28 ENCOUNTER — Other Ambulatory Visit: Payer: Self-pay

## 2019-09-28 ENCOUNTER — Encounter (HOSPITAL_COMMUNITY): Payer: Self-pay | Admitting: Emergency Medicine

## 2019-09-28 ENCOUNTER — Ambulatory Visit: Payer: Self-pay

## 2019-09-28 ENCOUNTER — Inpatient Hospital Stay (HOSPITAL_COMMUNITY)
Admission: EM | Admit: 2019-09-28 | Discharge: 2019-10-05 | DRG: 177 | Disposition: A | Discharge status: Home Health | Payer: Medicare Other | Attending: Internal Medicine | Admitting: Internal Medicine

## 2019-09-28 ENCOUNTER — Inpatient Hospital Stay (HOSPITAL_COMMUNITY): Payer: Medicare Other

## 2019-09-28 DIAGNOSIS — E1169 Type 2 diabetes mellitus with other specified complication: Secondary | ICD-10-CM | POA: Diagnosis present

## 2019-09-28 DIAGNOSIS — E785 Hyperlipidemia, unspecified: Secondary | ICD-10-CM | POA: Diagnosis present

## 2019-09-28 DIAGNOSIS — N200 Calculus of kidney: Secondary | ICD-10-CM | POA: Diagnosis not present

## 2019-09-28 DIAGNOSIS — K219 Gastro-esophageal reflux disease without esophagitis: Secondary | ICD-10-CM | POA: Diagnosis present

## 2019-09-28 DIAGNOSIS — E871 Hypo-osmolality and hyponatremia: Secondary | ICD-10-CM | POA: Diagnosis present

## 2019-09-28 DIAGNOSIS — I129 Hypertensive chronic kidney disease with stage 1 through stage 4 chronic kidney disease, or unspecified chronic kidney disease: Secondary | ICD-10-CM | POA: Diagnosis present

## 2019-09-28 DIAGNOSIS — E1122 Type 2 diabetes mellitus with diabetic chronic kidney disease: Secondary | ICD-10-CM | POA: Diagnosis present

## 2019-09-28 DIAGNOSIS — M160 Bilateral primary osteoarthritis of hip: Secondary | ICD-10-CM | POA: Diagnosis not present

## 2019-09-28 DIAGNOSIS — J1282 Pneumonia due to coronavirus disease 2019: Secondary | ICD-10-CM | POA: Diagnosis not present

## 2019-09-28 DIAGNOSIS — R109 Unspecified abdominal pain: Secondary | ICD-10-CM

## 2019-09-28 DIAGNOSIS — R6883 Chills (without fever): Secondary | ICD-10-CM | POA: Diagnosis present

## 2019-09-28 DIAGNOSIS — N1330 Unspecified hydronephrosis: Secondary | ICD-10-CM

## 2019-09-28 DIAGNOSIS — I10 Essential (primary) hypertension: Secondary | ICD-10-CM | POA: Diagnosis not present

## 2019-09-28 DIAGNOSIS — E872 Acidosis: Secondary | ICD-10-CM | POA: Diagnosis not present

## 2019-09-28 DIAGNOSIS — R112 Nausea with vomiting, unspecified: Secondary | ICD-10-CM | POA: Diagnosis not present

## 2019-09-28 DIAGNOSIS — R001 Bradycardia, unspecified: Secondary | ICD-10-CM | POA: Diagnosis not present

## 2019-09-28 DIAGNOSIS — E1121 Type 2 diabetes mellitus with diabetic nephropathy: Secondary | ICD-10-CM | POA: Diagnosis present

## 2019-09-28 DIAGNOSIS — N401 Enlarged prostate with lower urinary tract symptoms: Secondary | ICD-10-CM | POA: Diagnosis present

## 2019-09-28 DIAGNOSIS — M109 Gout, unspecified: Secondary | ICD-10-CM | POA: Diagnosis not present

## 2019-09-28 DIAGNOSIS — N1832 Chronic kidney disease, stage 3b: Secondary | ICD-10-CM | POA: Diagnosis present

## 2019-09-28 DIAGNOSIS — N179 Acute kidney failure, unspecified: Secondary | ICD-10-CM

## 2019-09-28 DIAGNOSIS — R29810 Facial weakness: Secondary | ICD-10-CM | POA: Diagnosis not present

## 2019-09-28 DIAGNOSIS — Z808 Family history of malignant neoplasm of other organs or systems: Secondary | ICD-10-CM

## 2019-09-28 DIAGNOSIS — Z7982 Long term (current) use of aspirin: Secondary | ICD-10-CM

## 2019-09-28 DIAGNOSIS — J9601 Acute respiratory failure with hypoxia: Secondary | ICD-10-CM | POA: Diagnosis not present

## 2019-09-28 DIAGNOSIS — E875 Hyperkalemia: Secondary | ICD-10-CM | POA: Diagnosis not present

## 2019-09-28 DIAGNOSIS — K5900 Constipation, unspecified: Secondary | ICD-10-CM | POA: Diagnosis not present

## 2019-09-28 DIAGNOSIS — Z801 Family history of malignant neoplasm of trachea, bronchus and lung: Secondary | ICD-10-CM

## 2019-09-28 DIAGNOSIS — U071 COVID-19: Secondary | ICD-10-CM | POA: Diagnosis not present

## 2019-09-28 DIAGNOSIS — N1831 Chronic kidney disease, stage 3a: Secondary | ICD-10-CM | POA: Diagnosis not present

## 2019-09-28 DIAGNOSIS — E1165 Type 2 diabetes mellitus with hyperglycemia: Secondary | ICD-10-CM | POA: Diagnosis present

## 2019-09-28 DIAGNOSIS — Z743 Need for continuous supervision: Secondary | ICD-10-CM | POA: Diagnosis not present

## 2019-09-28 DIAGNOSIS — Z436 Encounter for attention to other artificial openings of urinary tract: Secondary | ICD-10-CM | POA: Diagnosis not present

## 2019-09-28 DIAGNOSIS — Z7984 Long term (current) use of oral hypoglycemic drugs: Secondary | ICD-10-CM

## 2019-09-28 DIAGNOSIS — R000 Tachycardia, unspecified: Secondary | ICD-10-CM

## 2019-09-28 DIAGNOSIS — E860 Dehydration: Secondary | ICD-10-CM | POA: Diagnosis not present

## 2019-09-28 DIAGNOSIS — R1031 Right lower quadrant pain: Secondary | ICD-10-CM | POA: Diagnosis not present

## 2019-09-28 DIAGNOSIS — I700 Atherosclerosis of aorta: Secondary | ICD-10-CM | POA: Diagnosis not present

## 2019-09-28 DIAGNOSIS — R0902 Hypoxemia: Secondary | ICD-10-CM | POA: Diagnosis not present

## 2019-09-28 DIAGNOSIS — N189 Chronic kidney disease, unspecified: Secondary | ICD-10-CM | POA: Diagnosis present

## 2019-09-28 DIAGNOSIS — N281 Cyst of kidney, acquired: Secondary | ICD-10-CM | POA: Diagnosis not present

## 2019-09-28 DIAGNOSIS — R4781 Slurred speech: Secondary | ICD-10-CM | POA: Diagnosis not present

## 2019-09-28 DIAGNOSIS — E1151 Type 2 diabetes mellitus with diabetic peripheral angiopathy without gangrene: Secondary | ICD-10-CM | POA: Diagnosis not present

## 2019-09-28 DIAGNOSIS — Z8249 Family history of ischemic heart disease and other diseases of the circulatory system: Secondary | ICD-10-CM

## 2019-09-28 DIAGNOSIS — N320 Bladder-neck obstruction: Secondary | ICD-10-CM | POA: Diagnosis not present

## 2019-09-28 DIAGNOSIS — N3289 Other specified disorders of bladder: Secondary | ICD-10-CM | POA: Diagnosis not present

## 2019-09-28 DIAGNOSIS — Z800 Family history of malignant neoplasm of digestive organs: Secondary | ICD-10-CM

## 2019-09-28 DIAGNOSIS — Z8546 Personal history of malignant neoplasm of prostate: Secondary | ICD-10-CM

## 2019-09-28 DIAGNOSIS — R531 Weakness: Secondary | ICD-10-CM | POA: Diagnosis not present

## 2019-09-28 DIAGNOSIS — Z79899 Other long term (current) drug therapy: Secondary | ICD-10-CM

## 2019-09-28 DIAGNOSIS — H3530 Unspecified macular degeneration: Secondary | ICD-10-CM | POA: Diagnosis not present

## 2019-09-28 HISTORY — DX: Chronic kidney disease, unspecified: N17.9

## 2019-09-28 HISTORY — DX: Acute kidney failure, unspecified: N18.9

## 2019-09-28 LAB — CBC WITH DIFFERENTIAL/PLATELET
Abs Immature Granulocytes: 0.05 10*3/uL (ref 0.00–0.07)
Basophils Absolute: 0 10*3/uL (ref 0.0–0.1)
Basophils Relative: 0 %
Eosinophils Absolute: 0.1 10*3/uL (ref 0.0–0.5)
Eosinophils Relative: 1 %
HCT: 52 % (ref 39.0–52.0)
Hemoglobin: 17.1 g/dL — ABNORMAL HIGH (ref 13.0–17.0)
Immature Granulocytes: 1 %
Lymphocytes Relative: 8 %
Lymphs Abs: 0.6 10*3/uL — ABNORMAL LOW (ref 0.7–4.0)
MCH: 27.4 pg (ref 26.0–34.0)
MCHC: 32.9 g/dL (ref 30.0–36.0)
MCV: 83.3 fL (ref 80.0–100.0)
Monocytes Absolute: 0.6 10*3/uL (ref 0.1–1.0)
Monocytes Relative: 7 %
Neutro Abs: 6.2 10*3/uL (ref 1.7–7.7)
Neutrophils Relative %: 83 %
Platelets: 208 10*3/uL (ref 150–400)
RBC: 6.24 MIL/uL — ABNORMAL HIGH (ref 4.22–5.81)
RDW: 13.2 % (ref 11.5–15.5)
WBC: 7.5 10*3/uL (ref 4.0–10.5)
nRBC: 0 % (ref 0.0–0.2)

## 2019-09-28 LAB — HIV ANTIBODY (ROUTINE TESTING W REFLEX): HIV Screen 4th Generation wRfx: NONREACTIVE

## 2019-09-28 LAB — CBG MONITORING, ED
Glucose-Capillary: 122 mg/dL — ABNORMAL HIGH (ref 70–99)
Glucose-Capillary: 124 mg/dL — ABNORMAL HIGH (ref 70–99)

## 2019-09-28 LAB — COMPREHENSIVE METABOLIC PANEL
ALT: 26 U/L (ref 0–44)
AST: 19 U/L (ref 15–41)
Albumin: 3.3 g/dL — ABNORMAL LOW (ref 3.5–5.0)
Alkaline Phosphatase: 100 U/L (ref 38–126)
Anion gap: 17 — ABNORMAL HIGH (ref 5–15)
BUN: 59 mg/dL — ABNORMAL HIGH (ref 8–23)
CO2: 14 mmol/L — ABNORMAL LOW (ref 22–32)
Calcium: 9.2 mg/dL (ref 8.9–10.3)
Chloride: 101 mmol/L (ref 98–111)
Creatinine, Ser: 2.12 mg/dL — ABNORMAL HIGH (ref 0.61–1.24)
GFR calc Af Amer: 37 mL/min — ABNORMAL LOW (ref >60)
GFR calc non Af Amer: 32 mL/min — ABNORMAL LOW (ref >60)
Glucose, Bld: 208 mg/dL — ABNORMAL HIGH (ref 70–99)
Potassium: 4.4 mmol/L (ref 3.5–5.1)
Sodium: 132 mmol/L — ABNORMAL LOW (ref 135–145)
Total Bilirubin: 1.5 mg/dL — ABNORMAL HIGH (ref 0.3–1.2)
Total Protein: 7.9 g/dL (ref 6.5–8.1)

## 2019-09-28 LAB — TROPONIN I (HIGH SENSITIVITY)
Troponin I (High Sensitivity): 8 ng/L (ref <18)
Troponin I (High Sensitivity): 9 ng/L (ref <18)

## 2019-09-28 LAB — URINALYSIS, ROUTINE W REFLEX MICROSCOPIC
Bilirubin Urine: NEGATIVE
Glucose, UA: 50 mg/dL — AB
Ketones, ur: 5 mg/dL — AB
Leukocytes,Ua: NEGATIVE
Nitrite: NEGATIVE
Protein, ur: 30 mg/dL — AB
RBC / HPF: >50 RBC/hpf — ABNORMAL HIGH (ref 0–5)
Specific Gravity, Urine: 1.014 (ref 1.005–1.030)
pH: 5 (ref 5.0–8.0)

## 2019-09-28 LAB — CBC
HCT: 53 % — ABNORMAL HIGH (ref 39.0–52.0)
Hemoglobin: 17.3 g/dL — ABNORMAL HIGH (ref 13.0–17.0)
MCH: 27.2 pg (ref 26.0–34.0)
MCHC: 32.6 g/dL (ref 30.0–36.0)
MCV: 83.5 fL (ref 80.0–100.0)
Platelets: 201 10*3/uL (ref 150–400)
RBC: 6.35 MIL/uL — ABNORMAL HIGH (ref 4.22–5.81)
RDW: 13.2 % (ref 11.5–15.5)
WBC: 7.4 10*3/uL (ref 4.0–10.5)
nRBC: 0 % (ref 0.0–0.2)

## 2019-09-28 LAB — LACTATE DEHYDROGENASE: LDH: 138 U/L (ref 98–192)

## 2019-09-28 LAB — LACTIC ACID, PLASMA: Lactic Acid, Venous: 1.2 mmol/L (ref 0.5–1.9)

## 2019-09-28 LAB — CREATININE, SERUM
Creatinine, Ser: 2.08 mg/dL — ABNORMAL HIGH (ref 0.61–1.24)
GFR calc Af Amer: 38 mL/min — ABNORMAL LOW (ref >60)
GFR calc non Af Amer: 32 mL/min — ABNORMAL LOW (ref >60)

## 2019-09-28 LAB — SARS CORONAVIRUS 2 BY RT PCR (HOSPITAL ORDER, PERFORMED IN ~~LOC~~ HOSPITAL LAB): SARS Coronavirus 2: POSITIVE — AB

## 2019-09-28 LAB — PROTIME-INR
INR: 1 (ref 0.8–1.2)
Prothrombin Time: 13.1 seconds (ref 11.4–15.2)

## 2019-09-28 LAB — RAPID URINE DRUG SCREEN, HOSP PERFORMED
Amphetamines: NOT DETECTED
Barbiturates: NOT DETECTED
Benzodiazepines: NOT DETECTED
Cocaine: NOT DETECTED
Opiates: NOT DETECTED
Tetrahydrocannabinol: NOT DETECTED

## 2019-09-28 LAB — PROCALCITONIN: Procalcitonin: 0.13 ng/mL

## 2019-09-28 LAB — PHOSPHORUS: Phosphorus: 2.7 mg/dL (ref 2.5–4.6)

## 2019-09-28 LAB — TSH: TSH: 0.146 u[IU]/mL — ABNORMAL LOW (ref 0.350–4.500)

## 2019-09-28 LAB — HEPATITIS B SURFACE ANTIGEN: Hepatitis B Surface Ag: NONREACTIVE

## 2019-09-28 LAB — D-DIMER, QUANTITATIVE: D-Dimer, Quant: 0.89 ug/mL-FEU — ABNORMAL HIGH (ref 0.00–0.50)

## 2019-09-28 LAB — BRAIN NATRIURETIC PEPTIDE: B Natriuretic Peptide: 31.6 pg/mL (ref 0.0–100.0)

## 2019-09-28 LAB — MAGNESIUM: Magnesium: 2.6 mg/dL — ABNORMAL HIGH (ref 1.7–2.4)

## 2019-09-28 LAB — FIBRINOGEN: Fibrinogen: 792 mg/dL — ABNORMAL HIGH (ref 210–475)

## 2019-09-28 LAB — ETHANOL: Alcohol, Ethyl (B): <10 mg/dL (ref <10)

## 2019-09-28 LAB — FERRITIN: Ferritin: 240 ng/mL (ref 24–336)

## 2019-09-28 LAB — LIPASE, BLOOD: Lipase: 30 U/L (ref 11–51)

## 2019-09-28 LAB — C-REACTIVE PROTEIN: CRP: 4.3 mg/dL — ABNORMAL HIGH (ref <1.0)

## 2019-09-28 MED ORDER — INSULIN ASPART 100 UNIT/ML ~~LOC~~ SOLN
0.0000 [IU] | Freq: Every day | SUBCUTANEOUS | Status: DC
  Administered 2019-09-30 – 2019-10-04 (×3): 2 [IU] via SUBCUTANEOUS

## 2019-09-28 MED ORDER — ACETAMINOPHEN 650 MG RE SUPP: 650.0000 mg | Freq: Four times a day (QID) | RECTAL | Status: DC | PRN

## 2019-09-28 MED ORDER — SODIUM CHLORIDE 0.9 % IV BOLUS
500.0000 mL | Freq: Once | INTRAVENOUS | Status: AC
  Administered 2019-09-28: 500 mL via INTRAVENOUS

## 2019-09-28 MED ORDER — INSULIN ASPART 100 UNIT/ML ~~LOC~~ SOLN
0.0000 [IU] | Freq: Three times a day (TID) | SUBCUTANEOUS | Status: DC
  Administered 2019-09-28: 2 [IU] via SUBCUTANEOUS
  Administered 2019-09-29: 3 [IU] via SUBCUTANEOUS
  Administered 2019-09-29 – 2019-09-30 (×2): 2 [IU] via SUBCUTANEOUS
  Administered 2019-09-30 – 2019-10-01 (×3): 3 [IU] via SUBCUTANEOUS
  Administered 2019-10-01: 6 [IU] via SUBCUTANEOUS
  Administered 2019-10-02: 5 [IU] via SUBCUTANEOUS
  Administered 2019-10-02: 3 [IU] via SUBCUTANEOUS
  Administered 2019-10-02: 5 [IU] via SUBCUTANEOUS
  Administered 2019-10-03: 3 [IU] via SUBCUTANEOUS
  Administered 2019-10-03: 8 [IU] via SUBCUTANEOUS
  Administered 2019-10-03: 3 [IU] via SUBCUTANEOUS
  Administered 2019-10-04: 5 [IU] via SUBCUTANEOUS
  Administered 2019-10-04 (×2): 2 [IU] via SUBCUTANEOUS
  Administered 2019-10-05 (×2): 3 [IU] via SUBCUTANEOUS

## 2019-09-28 MED ORDER — ACETAMINOPHEN 325 MG PO TABS
650.0000 mg | ORAL_TABLET | Freq: Four times a day (QID) | ORAL | Status: DC | PRN
  Administered 2019-09-28 – 2019-10-04 (×7): 650 mg via ORAL
  Filled 2019-09-28 (×8): qty 2

## 2019-09-28 MED ORDER — ONDANSETRON HCL 4 MG PO TABS
4.0000 mg | ORAL_TABLET | Freq: Four times a day (QID) | ORAL | Status: DC | PRN
  Filled 2019-09-28: qty 1

## 2019-09-28 MED ORDER — ONDANSETRON HCL 4 MG/2ML IJ SOLN
4.0000 mg | Freq: Four times a day (QID) | INTRAMUSCULAR | Status: DC | PRN
  Administered 2019-09-28 – 2019-10-05 (×14): 4 mg via INTRAVENOUS
  Filled 2019-09-28 (×13): qty 2

## 2019-09-28 MED ORDER — SODIUM CHLORIDE 0.9 % IV BOLUS
1000.0000 mL | Freq: Once | INTRAVENOUS | Status: AC
  Administered 2019-09-28: 1000 mL via INTRAVENOUS

## 2019-09-28 MED ORDER — ZINC SULFATE 220 (50 ZN) MG PO CAPS
220.0000 mg | ORAL_CAPSULE | Freq: Every day | ORAL | Status: DC
  Administered 2019-09-28 – 2019-10-05 (×8): 220 mg via ORAL
  Filled 2019-09-28 (×8): qty 1

## 2019-09-28 MED ORDER — SODIUM CHLORIDE 0.9 % IV SOLN
1.0000 g | INTRAVENOUS | Status: DC
  Administered 2019-09-28: 1 g via INTRAVENOUS
  Filled 2019-09-28: qty 10

## 2019-09-28 MED ORDER — SODIUM CHLORIDE 0.9 % IV SOLN: INTRAVENOUS | Status: DC

## 2019-09-28 MED ORDER — SODIUM CHLORIDE 0.9 % IV SOLN
100.0000 mg | Freq: Every day | INTRAVENOUS | Status: AC
  Administered 2019-09-29 – 2019-10-02 (×4): 100 mg via INTRAVENOUS
  Filled 2019-09-28 (×5): qty 20

## 2019-09-28 MED ORDER — ENOXAPARIN SODIUM 40 MG/0.4ML ~~LOC~~ SOLN
40.0000 mg | SUBCUTANEOUS | Status: DC
  Administered 2019-09-28 – 2019-10-03 (×6): 40 mg via SUBCUTANEOUS
  Filled 2019-09-28 (×6): qty 0.4

## 2019-09-28 MED ORDER — SODIUM CHLORIDE 0.9 % IV SOLN
200.0000 mg | Freq: Once | INTRAVENOUS | Status: AC
  Administered 2019-09-28: 200 mg via INTRAVENOUS
  Filled 2019-09-28: qty 40

## 2019-09-28 MED ORDER — GUAIFENESIN-DM 100-10 MG/5ML PO SYRP
10.0000 mL | ORAL_SOLUTION | ORAL | Status: DC | PRN
  Administered 2019-09-29 – 2019-10-02 (×10): 10 mL via ORAL
  Filled 2019-09-28 (×11): qty 10

## 2019-09-28 MED ORDER — HYDRALAZINE HCL 20 MG/ML IJ SOLN
10.0000 mg | Freq: Four times a day (QID) | INTRAMUSCULAR | Status: DC | PRN
  Administered 2019-09-28 – 2019-10-02 (×4): 10 mg via INTRAVENOUS
  Filled 2019-09-28 (×5): qty 1

## 2019-09-28 MED ORDER — ASPIRIN EC 81 MG PO TBEC
81.0000 mg | DELAYED_RELEASE_TABLET | Freq: Every day | ORAL | Status: DC
  Administered 2019-09-28 – 2019-10-05 (×8): 81 mg via ORAL
  Filled 2019-09-28 (×8): qty 1

## 2019-09-28 MED ORDER — ATORVASTATIN CALCIUM 10 MG PO TABS
20.0000 mg | ORAL_TABLET | Freq: Every day | ORAL | Status: DC
  Administered 2019-09-29 – 2019-10-05 (×7): 20 mg via ORAL
  Filled 2019-09-28 (×7): qty 2

## 2019-09-28 MED ORDER — ASCORBIC ACID 500 MG PO TABS
500.0000 mg | ORAL_TABLET | Freq: Every day | ORAL | Status: DC
  Administered 2019-09-28 – 2019-10-05 (×8): 500 mg via ORAL
  Filled 2019-09-28 (×8): qty 1

## 2019-09-28 MED ORDER — DEXAMETHASONE SODIUM PHOSPHATE 10 MG/ML IJ SOLN: 6.0000 mg | INTRAMUSCULAR | Status: DC

## 2019-09-28 MED ORDER — HYDROCOD POLST-CPM POLST ER 10-8 MG/5ML PO SUER
5.0000 mL | Freq: Two times a day (BID) | ORAL | Status: DC | PRN
  Administered 2019-09-29 – 2019-09-30 (×2): 5 mL via ORAL
  Filled 2019-09-28 (×3): qty 5

## 2019-09-28 MED ORDER — METOPROLOL TARTRATE 50 MG PO TABS
50.0000 mg | ORAL_TABLET | Freq: Every day | ORAL | Status: DC
  Administered 2019-09-29 – 2019-10-05 (×7): 50 mg via ORAL
  Filled 2019-09-28: qty 2
  Filled 2019-09-28 (×6): qty 1

## 2019-09-28 NOTE — ED Provider Notes (Signed)
[name]   CSN: [id number] Arrival date & time: 09/28/19  1210     History Chief Complaint  Patient presents with  . Emesis  . Nausea    [name] is a 64 y.o. male.  65 year old male with prior medical history as detailed below presents for evaluation of weakness with associated nausea and vomiting.  Patient reports poor p.o. intake over the last 3 to 4 days.  Patient reports multiple rounds of emesis.  He denies bloody emesis.  He denies bloody stool.  He does report history of chronic renal insufficiency.  He is concerned that his current state is affecting his kidneys.  He is complaining of feeling dehydrated.  The history is provided by the patient.  Emesis Severity:  Moderate Timing:  Constant Progression:  Unchanged Chronicity:  New Relieved by:  Nothing Worsened by:  Nothing Ineffective treatments:  None tried Associated symptoms: no abdominal pain        Past Medical History:  Diagnosis Date  . Closed nondisplaced spiral fracture of shaft of right tibia   . Complicated urinary tract infection 09/06/2016  . Depression   . Diabetes mellitus without complication ([place])   . GERD (gastroesophageal reflux disease)   . Gout    right foot  . Hemorrhoids   . Hypertension   . Hypertension   . Macular degeneration   . Microcytic anemia   . Prostate cancer ([place]) 08/2009  . Rectal bleeding 07/31/2016  . Tubular adenoma     Patient Active Problem List   Diagnosis Date Noted  . Acute on chronic kidney failure ([place]) 09/28/2019  . Hyponatremia 09/28/2019  . Sinus tachycardia 09/28/2019  . Insomnia 08/26/2018  . Nephropathy 03/26/2018  . Urinary urgency 03/26/2018  . Type 2 diabetes mellitus with diabetic nephropathy, without long-term current use of insulin ([place]) 03/26/2018  . Essential hypertension 03/26/2018  . Chronic kidney disease (CKD), stage III (moderate) 03/26/2018  . Conductive hearing loss,  bilateral 11/07/2017  . Uncontrolled type 2 diabetes mellitus with hyperglycemia ([place]) 07/31/2016  . Dyslipidemia associated with type 2 diabetes mellitus ([place]) 05/29/2016  . Microcytic anemia 02/17/2012  . 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 Benign hypertension 02/17/2012  . Depressive disorder 02/17/2012    Past Surgical History:  Procedure Laterality Date  . CYSTOSCOPY WITH RETROGRADE PYELOGRAM, URETEROSCOPY AND STENT PLACEMENT Right 07/31/2016   Procedure: CYSTOSCOPY WITH RIGHT  RETROGRADE PYELOGRAM, URETEROSCOPY;  Surgeon: [name], MD;  Location: [facility];  Service: Urology;  Laterality: Right;  . INGUINAL HERNIA REPAIR    . Left ankle joint fusion  1981  . PROSTATE BIOPSY     x 2  . URETERAL REIMPLANTION  07/31/2016   Procedure: URETERAL REIMPLANT, right boari flap, right psoas hitch;  Surgeon: [name], MD;  Location: [facility];  Service: Urology;;       Family History  Problem Relation Age of Onset  . Lung cancer Mother        Deceased  . Throat cancer Brother   . Pancreatic cancer Father   . Heart disease Father        Deceased  . Lung cancer Maternal Uncle        nephew  . Diabetes Other     Social History   Tobacco Use  . Smoking status: Never Smoker  . Smokeless tobacco: Never Used  Vaping Use  . Vaping Use: Never used  Substance Use Topics  . Alcohol use:  Yes    Comment: ocassionally  . Drug use: No    Home Medications Prior to Admission medications   Medication Sig Start Date End Date Taking? Authorizing Provider  acetaminophen (TYLENOL) 500 MG tablet Take 500 mg by mouth every 6 (six) hours as needed for mild pain or moderate pain.   Yes [provider]  aspirin EC 81 MG tablet Take 81 mg by mouth daily.   Yes [provider]  atorvastatin (LIPITOR) 20 MG tablet TAKE 1 TABLET BY MOUTH EVERY DAY 03/31/19  Yes [name], [name]  metoprolol tartrate (LOPRESSOR) 50 MG tablet TAKE 1 TABLET BY MOUTH EVERY DAY 04/13/19   Yes [name], [name]  Multiple Vitamin (MULTIVITAMIN) tablet Take 1 tablet by mouth daily.   Yes [provider]  MYRBETRIQ 25 MG TB24 tablet TAKE 1 TABLET BY MOUTH EVERY DAY 08/13/19  Yes [name], [name]  nystatin (NYSTATIN) powder Apply 1 application topically 3 (three) times daily. 08/26/19  Yes [name], [name]  olmesartan-hydrochlorothiazide (BENICAR HCT) 40-25 MG tablet TAKE 1 TABLET BY MOUTH EVERY DAY 07/12/19  Yes [name], [name]  RYBELSUS 14 MG TABS TAKE 1 TABLET BY MOUTH DAILY. [place] 09/23/19  Yes [name], [name]  sildenafil (REVATIO) 20 MG tablet TAKE 1 TABLET BY MOUTH EVERY DAY (PRIOR AUTHORIZATION DENIED) 09/09/19  Yes [name], [name]  TRADJENTA 5 MG TABS tablet TAKE 1 TABLET BY MOUTH EVERY DAY 09/23/19  Yes [name], [name]  fluconazole (DIFLUCAN) 100 MG tablet Take 1 tablet (100 mg total) by mouth daily. Take 1 tablet by mouth now repeat in 5 days Patient not taking: Reported on 09/28/2019 08/26/19   [name], [name]  guaiFENesin (MUCINEX) 600 MG 12 hr tablet Take 1 tablet (600 mg total) by mouth 2 (two) times daily. Patient not taking: Reported on 09/28/2019 03/25/19 03/24/20  [name], [name]  traMADol (ULTRAM) 50 MG tablet Take 1 tablet (50 mg total) by mouth every 6 (six) hours as needed for severe pain. Patient not taking: Reported on 09/28/2019 11/27/18   Dalia Heading, PA-C  traZODone (DESYREL) 50 MG tablet TAKE 1 TABLET BY MOUTH EVERYDAY AT BEDTIME Patient not taking: Reported on 09/28/2019 02/03/19   [name], [name]    Allergies    Patient has no known allergies.  Review of Systems   Review of Systems  Gastrointestinal: Positive for vomiting. Negative for abdominal pain.  All other systems reviewed and are negative.   Physical Exam Updated Vital Signs BP (!) 150/96   Pulse (!) 113   Temp 98 F (36.7 C) (Oral)   Resp 14   Ht 5\' 6"  (1.676 m)   Wt 77.1 kg   SpO2 95%   BMI 27.44 kg/m   Physical Exam Vitals and  nursing Black reviewed.  Constitutional:      General: He is not in acute distress.    Appearance: He is well-developed.  HENT:     Head: Normocephalic and atraumatic.     Mouth/Throat:     Mouth: Mucous membranes are dry.  Eyes:     Conjunctiva/sclera: Conjunctivae normal.     Pupils: Pupils are equal, round, and reactive to light.  Cardiovascular:     Rate and Rhythm: Normal rate and regular rhythm.     Heart sounds: Normal heart sounds.  Pulmonary:     Effort: Pulmonary effort is normal. No respiratory distress.     Breath sounds: Normal breath sounds.  Abdominal:     General:  There is no distension.     Palpations: Abdomen is soft.     Tenderness: There is no abdominal tenderness.  Musculoskeletal:        General: No deformity. Normal range of motion.     Cervical back: Normal range of motion and neck supple.  Skin:    General: Skin is warm and dry.  Neurological:     General: No focal deficit present.     Mental Status: He is alert and oriented to person, place, and time.     ED Results / Procedures / Treatments   Labs (all labs ordered are listed, but only abnormal results are displayed) Labs Reviewed  COMPREHENSIVE METABOLIC PANEL - Abnormal; Notable for the following components:      Result Value   Sodium 132 (*)    CO2 14 (*)    Glucose, Bld 208 (*)    BUN 59 (*)    Creatinine, Ser 2.12 (*)    Albumin 3.3 (*)    Total Bilirubin 1.5 (*)    GFR calc non Af Amer 32 (*)    GFR calc Af Amer 37 (*)    Anion gap 17 (*)    All other components within normal limits  CBC WITH DIFFERENTIAL/PLATELET - Abnormal; Notable for the following components:   RBC 6.24 (*)    Hemoglobin 17.1 (*)    Lymphs Abs 0.6 (*)    All other components within normal limits  SARS CORONAVIRUS 2 BY RT PCR (HOSPITAL ORDER, [facility])  ETHANOL  PROTIME-INR  URINALYSIS, ROUTINE W REFLEX MICROSCOPIC  RAPID URINE DRUG SCREEN, HOSP PERFORMED  HIV ANTIBODY (ROUTINE  TESTING W REFLEX)  CBC  CREATININE, SERUM  MAGNESIUM  PHOSPHORUS  TSH  URINALYSIS, ROUTINE W REFLEX MICROSCOPIC  LIPASE, BLOOD  LACTIC ACID, PLASMA  LACTIC ACID, PLASMA    EKG EKG Interpretation  Date/Time:  Tuesday September 28 2019 12:16:23 EDT Ventricular Rate:  115 PR Interval:    QRS Duration: 81 QT Interval:  301 QTC Calculation: 417 R Axis:   -22 Text Interpretation: Sinus tachycardia Borderline left axis deviation Consider anterior infarct Confirmed by Dene Gentry [phone]) on 09/28/2019 12:21:49 PM   Radiology CT Head Wo Contrast  Result Date: 09/28/2019 CLINICAL DATA:  Altered mental status with nausea and vomiting EXAM: CT HEAD WITHOUT CONTRAST TECHNIQUE: Contiguous axial images were obtained from the base of the skull through the vertex without intravenous contrast. COMPARISON:  March 22, 2016 FINDINGS: Brain: Moderate diffuse atrophy is stable. Prominence of the cisterna magna is an anatomic variant. There is no intracranial mass, hemorrhage, extra-axial fluid collection, or midline shift. There is small vessel disease in the centra semiovale bilaterally, stable from prior study. No acute infarct evident. Vascular: No hyperdense vessel. There is calcification in each distal vertebral and carotid siphon region. Skull: Bony calvarium appears intact. Sinuses/Orbits: There is opacification in the left and to a lesser extent right sphenoid sinus regions. There is opacification in multiple ethmoid air cells. There is also mucosal thickening in visualized maxillary antra, more on the left than the right. There is mucosal thickening in the inferior left frontal sinus. Orbits appear symmetric bilaterally. Other: Mastoid air cells are clear. IMPRESSION: Stable atrophy with periventricular small vessel disease. No acute infarct evident. No appreciable mass or hemorrhage. There are foci of arterial vascular calcification. There are multiple foci of paranasal sinus disease. Electronically  Signed   By: Lowella Grip III M.D.   On: 09/28/2019 13:02  DG Chest Port 1 View  Result Date: 09/28/2019 CLINICAL DATA:  Weakness EXAM: PORTABLE CHEST 1 VIEW COMPARISON:  2018 FINDINGS: Low lung volumes. Patchy density at the right lung base. Mild chronic interstitial prominence. No pleural effusion or pneumothorax. Heart size is normal for portable technique. IMPRESSION: Right lung base patchy atelectasis/consolidation. Patchy density was also present on 2018 study therefore scarring is also a consideration. Electronically Signed   By: Macy Mis M.D.   On: 09/28/2019 12:57    Procedures Procedures (including critical care time)  Medications Ordered in ED Medications  sodium chloride 0.9 % bolus 1,000 mL (has no administration in time range)  enoxaparin (LOVENOX) injection 40 mg (has no administration in time range)  acetaminophen (TYLENOL) tablet 650 mg (has no administration in time range)    Or  acetaminophen (TYLENOL) suppository 650 mg (has no administration in time range)  0.9 %  sodium chloride infusion (has no administration in time range)  ondansetron (ZOFRAN) tablet 4 mg (has no administration in time range)    Or  ondansetron (ZOFRAN) injection 4 mg (has no administration in time range)  aspirin EC tablet 81 mg (has no administration in time range)  atorvastatin (LIPITOR) tablet 20 mg (has no administration in time range)  metoprolol tartrate (LOPRESSOR) tablet 50 mg (has no administration in time range)  insulin aspart (novoLOG) injection 0-15 Units (has no administration in time range)  insulin aspart (novoLOG) injection 0-5 Units (has no administration in time range)  sodium chloride 0.9 % bolus 500 mL (500 mLs Intravenous New Bag/Given 09/28/19 1230)    ED Course  I have reviewed the triage vital signs and the nursing notes.  Pertinent labs & imaging results that were available during my care of the patient were reviewed by me and considered in my medical  decision making (see chart for details).    MDM Rules/Calculators/A&P                          MDM  Screen complete  [name] was evaluated in Emergency Department on 09/28/2019 for the symptoms described in the history of present illness. He was evaluated in the context of the global COVID-19 pandemic, which necessitated consideration that the patient might be at risk for infection with the SARS-CoV-2 virus that causes COVID-19. Institutional protocols and algorithms that pertain to the evaluation of patients at risk for COVID-19 are in a state of rapid change based on information released by regulatory bodies including the CDC and federal and state organizations. These policies and algorithms were followed during the patient's care in the ED.  Patient is presented for evaluation of nausea, vomiting, and suspected dehydration.  Exam suggest dehydration.  Work-up reveals acute kidney injury.  Patient will require admission for further IV fluids and additional work-up and treatment.  Hospitalist service is aware of case and will evaluate for admission.   Final Clinical Impression(s) / ED Diagnoses Final diagnoses:  AKI (acute kidney injury) [facility])    Rx / DC Orders ED Discharge Orders    None       Valarie Merino, MD 09/28/19 1431

## 2019-09-28 NOTE — Patient Instructions (Signed)
Social Worker Visit Information  Goals we discussed today:  Goals Addressed              This Visit's Progress     Patient Stated   .  "I need help getting into an assisted living facility" (pt-stated)        [place] (see longitudinal plan of care for additional care plan information)  Current Barriers:  Marland Kitchen Knowledge Deficits related to process for admission into an ALF . Chronic Disease Management support and education needs related to CKD III, DM II, HTN  Nurse Case Manager Clinical Goal(s):  Marland Kitchen Over the next 90 days, patient will work with embedded BSW and PCP to address needs related to assistance for admission into an ALF  CCM SW Interventions: Completed 09/28/19 . Collaboration with patient primary care provider who reports she has yet to complete Fl2 at this time . SW advised patient PCP SW has been provided an updated Fl2 form from the state of [place]  . Provided updated Fl2 form to patient primary care provider for completion  Completed 09/24/19 . Successful outbound call placed to the patient to advise paperwork has been sent to provider for completion . Determined the patient is no longer wanting to move into [facility] and is considering SNF versus ALF . Educated the patient on the difference in levels of care . Determined the patient feels he is most appropriate for ALF but would rather look elsewhere . Advised the patient SW would send patient Fl2 to local ALF who accept special assistance to seek bed offer . Collaboration with patient primary provider to request completed paperwork be sent directly to SW to send out seeking bed offers . SW to continue to follow  CCM RN CM Interventions:  . Inter-disciplinary care team collaboration (see longitudinal plan of care) . Determined patient would like to move into an ALF and would like assistance from the embedded BSW and PCP . Collaborated with embedded BSW [name] regarding patient's request for assistance  with admission into an ALF . Discussed plans with patient for ongoing care management follow up and provided patient with direct contact information for care management team  Patient Self Care Activities:  . Self administers medications as prescribed . Attends all scheduled provider appointments . Calls pharmacy for medication refills . Calls provider office for new concerns or questions  Please see past updates related to this goal by clicking on the "Past Updates" button in the selected goal          Follow Up Plan: SW will follow up with patient by phone over the next two weeks.  [name], BSW, CDP Social Worker, Certified Dementia Practitioner [facility] [phone]

## 2019-09-28 NOTE — ED Notes (Signed)
Patient transported to CT 

## 2019-09-28 NOTE — ED Triage Notes (Signed)
Pt BIB [facility] from home. Per EMS, pt received covid shot 5 days ago and has not felt right since. EMS noted horizontal gaze and left sided facial droop. VSS. NAD.

## 2019-09-28 NOTE — H&P (Addendum)
History and Physical    [name] BSJ:[id number] DOB: March 01, 1956 DOA: 09/28/2019  PCP: [name], [name]  Patient coming from: Home I have personally briefly reviewed patient's old medical records in [place]  Chief Complaint: " I am feeling bad since 1 week"  HPI: [name] is a 64 y.o. male with medical history significant of hypertension, hyperlipidemia, diabetes mellitus, GERD presents to emergency department with generalized weakness, nausea and vomiting since 1 week.  Patient tells me that his symptoms started 1 week ago with generalized weakness, lethargic, nausea and vomiting, lightheadedness, dizziness and unable to keep anything down.  Patient tells me that he is not feeling well, has no energy to walk.  He tells me that he has vomited more than 10 times in 1 week which is nonbloody and he is unable to keep anything down.  Reports difficulty in urinating, lower abdominal and right flank pain, chills, dry cough as well.  No dysuria, hematuria, foul-smelling urine.  Has chronic back pain.  Denies headache, blurry vision, slurred speech, numbness tingling sensation in bilateral upper or lower extremities, diarrhea, melena, or weight changes.  He lives alone.  No history of smoking, alcohol, illicit drug use.  ED Course: Upon arrival to ED: Patient tachycardic, afebrile with no leukocytosis, CMP shows worsening kidney function and sodium of 132, anion gap: 72, blood glucose: 208, ethanol, INR: WNL, patient tested positive for COVID-19.  CT head was obtained as there was a concern for facial drop and lower extremity weakness which came back negative.  Chest x-ray shows right lung base patchy atelectasis/consolidation.  Patchy density was also present on 2018 study therefore scaring is also a consideration.  Patient received 1500 cc IV fluid bolus in ED.   Review of Systems: As per HPI otherwise negative.    Past Medical History:  Diagnosis Date  . Closed nondisplaced  spiral fracture of shaft of right tibia   . Complicated urinary tract infection 09/06/2016  . Depression   . Diabetes mellitus without complication ([place])   . GERD (gastroesophageal reflux disease)   . Gout    right foot  . Hemorrhoids   . Hypertension   . Hypertension   . Macular degeneration   . Microcytic anemia   . Prostate cancer ([place]) 08/2009  . Rectal bleeding 07/31/2016  . Tubular adenoma     Past Surgical History:  Procedure Laterality Date  . CYSTOSCOPY WITH RETROGRADE PYELOGRAM, URETEROSCOPY AND STENT PLACEMENT Right 07/31/2016   Procedure: CYSTOSCOPY WITH RIGHT  RETROGRADE PYELOGRAM, URETEROSCOPY;  Surgeon: [name], MD;  Location: [facility];  Service: Urology;  Laterality: Right;  . INGUINAL HERNIA REPAIR    . Left ankle joint fusion  1981  . PROSTATE BIOPSY     x 2  . URETERAL REIMPLANTION  07/31/2016   Procedure: URETERAL REIMPLANT, right boari flap, right psoas hitch;  Surgeon: [name], MD;  Location: [facility];  Service: Urology;;     reports that he has never smoked. He has never used smokeless tobacco. He reports current alcohol use. He reports that he does not use drugs.  No Known Allergies  Family History  Problem Relation Age of Onset  . Lung cancer Mother        Deceased  . Throat cancer Brother   . Pancreatic cancer Father   . Heart disease Father        Deceased  . Lung cancer Maternal Uncle  nephew  . Diabetes Other     Prior to Admission medications   Medication Sig Start Date End Date Taking? Authorizing Provider  acetaminophen (TYLENOL) 500 MG tablet Take 500 mg by mouth every 6 (six) hours as needed for mild pain or moderate pain.   Yes [provider]  aspirin EC 81 MG tablet Take 81 mg by mouth daily.   Yes [provider]  atorvastatin (LIPITOR) 20 MG tablet TAKE 1 TABLET BY MOUTH EVERY DAY 03/31/19  Yes [name], [name]  metoprolol tartrate (LOPRESSOR) 50 MG tablet TAKE 1 TABLET BY MOUTH EVERY  DAY 04/13/19  Yes [name], [name]  Multiple Vitamin (MULTIVITAMIN) tablet Take 1 tablet by mouth daily.   Yes [provider]  MYRBETRIQ 25 MG TB24 tablet TAKE 1 TABLET BY MOUTH EVERY DAY 08/13/19  Yes [name], [name]  nystatin (NYSTATIN) powder Apply 1 application topically 3 (three) times daily. 08/26/19  Yes [name], [name]  olmesartan-hydrochlorothiazide (BENICAR HCT) 40-25 MG tablet TAKE 1 TABLET BY MOUTH EVERY DAY 07/12/19  Yes [name], [name]  RYBELSUS 14 MG TABS TAKE 1 TABLET BY MOUTH DAILY. [place] 09/23/19  Yes [name], [name]  sildenafil (REVATIO) 20 MG tablet TAKE 1 TABLET BY MOUTH EVERY DAY (PRIOR AUTHORIZATION DENIED) 09/09/19  Yes [name], [name]  TRADJENTA 5 MG TABS tablet TAKE 1 TABLET BY MOUTH EVERY DAY 09/23/19  Yes [name], [name]  fluconazole (DIFLUCAN) 100 MG tablet Take 1 tablet (100 mg total) by mouth daily. Take 1 tablet by mouth now repeat in 5 days Patient not taking: Reported on 09/28/2019 08/26/19   [name], [name]  guaiFENesin (MUCINEX) 600 MG 12 hr tablet Take 1 tablet (600 mg total) by mouth 2 (two) times daily. Patient not taking: Reported on 09/28/2019 03/25/19 03/24/20  [name], [name]  traMADol (ULTRAM) 50 MG tablet Take 1 tablet (50 mg total) by mouth every 6 (six) hours as needed for severe pain. Patient not taking: Reported on 09/28/2019 11/27/18   Dalia Heading, PA-C  traZODone (DESYREL) 50 MG tablet TAKE 1 TABLET BY MOUTH EVERYDAY AT BEDTIME Patient not taking: Reported on 09/28/2019 02/03/19   [name], [name]    Physical Exam: Vitals:   09/28/19 1230 09/28/19 1315 09/28/19 1345 09/28/19 1402  BP: (!) 146/93 137/82 (!) 146/83 (!) 150/96  Pulse: (!) 134 (!) 119 (!) 109 (!) 113  Resp: (!) 22 (!) 21 16 14   Temp:      TempSrc:      SpO2: 96% 95% 96% 95%  Weight:      Height:        Constitutional: NAD, calm, comfortable appears weak and lethargic, on room air Eyes: PERRL, lids and conjunctivae  normal ENMT: Mucous membranes are moist. Posterior pharynx clear of any exudate or lesions.Normal dentition.  Neck: normal, supple, no masses, no thyromegaly Respiratory: clear to auscultation bilaterally, no wheezing, no crackles. Normal respiratory effort. No accessory muscle use.  Cardiovascular: Regular rate and rhythm, no murmurs / rubs / gallops. No extremity edema. 2+ pedal pulses. No carotid bruits.  Abdomen: Right flank and lower abdominal tenderness positive, no masses palpated. No hepatosplenomegaly. Bowel sounds positive.  Musculoskeletal: no clubbing / cyanosis. No joint deformity upper and lower extremities. Good ROM, no contractures. Normal muscle tone.  Skin: no rashes, lesions, ulcers. No induration Neurologic: CN 2-12 grossly intact. Sensation intact, DTR normal. Strength 5/5 in all 4.  Psychiatric: Normal judgment and insight. Alert and oriented x 3.  Normal mood.    Labs on Admission: I have personally reviewed following labs and imaging studies  CBC: Recent Labs  Lab 09/28/19 1220  WBC 7.5  NEUTROABS 6.2  HGB 17.1*  HCT 52.0  MCV 83.3  PLT 277   Basic Metabolic Panel: Recent Labs  Lab 09/28/19 1220  NA 132*  K 4.4  CL 101  CO2 14*  GLUCOSE 208*  BUN 59*  CREATININE 2.12*  CALCIUM 9.2   GFR: Estimated Creatinine Clearance: 34 mL/min (A) (by C-G formula based on SCr of 2.12 mg/dL (H)). Liver Function Tests: Recent Labs  Lab 09/28/19 1220  AST 19  ALT 26  ALKPHOS 100  BILITOT 1.5*  PROT 7.9  ALBUMIN 3.3*   No results for input(s): LIPASE, AMYLASE in the last 168 hours. No results for input(s): AMMONIA in the last 168 hours. Coagulation Profile: Recent Labs  Lab 09/28/19 1220  INR 1.0   Cardiac Enzymes: No results for input(s): CKTOTAL, CKMB, CKMBINDEX, TROPONINI in the last 168 hours. BNP (last 3 results) No results for input(s): PROBNP in the last 8760 hours. HbA1C: No results for input(s): HGBA1C in the last 72 hours. CBG: No  results for input(s): GLUCAP in the last 168 hours. Lipid Profile: No results for input(s): CHOL, HDL, LDLCALC, TRIG, CHOLHDL, LDLDIRECT in the last 72 hours. Thyroid Function Tests: No results for input(s): TSH, T4TOTAL, FREET4, T3FREE, THYROIDAB in the last 72 hours. Anemia Panel: No results for input(s): VITAMINB12, FOLATE, FERRITIN, TIBC, IRON, RETICCTPCT in the last 72 hours. Urine analysis:    Component Value Date/Time   COLORURINE YELLOW 09/30/2016 2108   APPEARANCEUR HAZY (A) 09/30/2016 2108   LABSPEC 1.010 09/30/2016 2108   PHURINE 5.0 09/30/2016 2108   GLUCOSEU NEGATIVE 09/30/2016 2108   HGBUR LARGE (A) 09/30/2016 2108   BILIRUBINUR negative 08/26/2019 1516   KETONESUR NEGATIVE 09/30/2016 2108   PROTEINUR Positive (A) 08/26/2019 1516   PROTEINUR 30 (A) 09/30/2016 2108   UROBILINOGEN 0.2 08/26/2019 1516   UROBILINOGEN 0.2 04/18/2008 1153   NITRITE negative 08/26/2019 1516   NITRITE NEGATIVE 09/30/2016 2108   LEUKOCYTESUR Negative 08/26/2019 1516    Radiological Exams on Admission: CT Head Wo Contrast  Result Date: 09/28/2019 CLINICAL DATA:  Altered mental status with nausea and vomiting EXAM: CT HEAD WITHOUT CONTRAST TECHNIQUE: Contiguous axial images were obtained from the base of the skull through the vertex without intravenous contrast. COMPARISON:  March 22, 2016 FINDINGS: Brain: Moderate diffuse atrophy is stable. Prominence of the cisterna magna is an anatomic variant. There is no intracranial mass, hemorrhage, extra-axial fluid collection, or midline shift. There is small vessel disease in the centra semiovale bilaterally, stable from prior study. No acute infarct evident. Vascular: No hyperdense vessel. There is calcification in each distal vertebral and carotid siphon region. Skull: Bony calvarium appears intact. Sinuses/Orbits: There is opacification in the left and to a lesser extent right sphenoid sinus regions. There is opacification in multiple ethmoid air cells.  There is also mucosal thickening in visualized maxillary antra, more on the left than the right. There is mucosal thickening in the inferior left frontal sinus. Orbits appear symmetric bilaterally. Other: Mastoid air cells are clear. IMPRESSION: Stable atrophy with periventricular small vessel disease. No acute infarct evident. No appreciable mass or hemorrhage. There are foci of arterial vascular calcification. There are multiple foci of paranasal sinus disease. Electronically Signed   By: Lowella Grip III M.D.   On: 09/28/2019 13:02   DG Chest [facility]  Result Date: 09/28/2019 CLINICAL DATA:  Weakness EXAM: PORTABLE CHEST 1 VIEW COMPARISON:  2018 FINDINGS: Low lung volumes. Patchy density at the right lung base. Mild chronic interstitial prominence. No pleural effusion or pneumothorax. Heart size is normal for portable technique. IMPRESSION: Right lung base patchy atelectasis/consolidation. Patchy density was also present on 2018 study therefore scarring is also a consideration. Electronically Signed   By: Macy Mis M.D.   On: 09/28/2019 12:57    EKG: Independently reviewed.  Sinus tachycardia, borderline left axis deviation.  No ST elevation or depression noted.  Assessment/Plan Principal Problem:   Acute on chronic kidney failure (HCC) Active Problems:   Dyslipidemia associated with type 2 diabetes mellitus (HCC)   Type 2 diabetes mellitus with diabetic nephropathy, without long-term current use of insulin (HCC)   Essential hypertension   Hyponatremia   Sinus tachycardia   Acute on chronic kidney failure: -Secondary to dehydration due to nausea and vomiting since 1 week -Creatinine: 2.12, GFR: 32 (baseline creatinine: 1.76, GFR: 40) -Lipase, lactic acid: WNL -Patient received 1500 cc of IV fluid bolus in the ED. -Admit patient on the floor. -Hold nephrotoxic medication.  We will keep him n.p.o.  Advance diet as tolerated.  Zofran as needed for nausea and vomiting -Monitor  vitals closely  Right flank/lower abdominal pain with urinary symptoms: -Patient reports difficulty in passing urine-we will get bladder scan and in and out cath.  Check UA and UC. -Empirically start on IV Rocephin.  Consider changing antibiotics based on culture result. -We will get CT abdomen/pelvis to check for pyelonephritis  COVID-19 infection: -Patient is maintaining oxygen saturation more than 94% on room air.  Reviewed chest x-ray. -Start patient on remdesivir as per pharmacy.   -Check inflammatory markers.  Repeat inflammatory markers tomorrow AM. -Start on antitussive, p.o. vitamins -Consult PT/OT for generalized weakness -On fall precautions  Sinus tachycardia: -Likely secondary to dehydration -On telemetry.  Check TSH  Type 2 diabetes mellitus: Check A1c -Hold Tradjenta and Rybelsus -Start on sliding scale insulin and monitor blood sugar closely  Hypertension: Continue metoprolol -Hold olmesartan-HCTZ due to AKI.  Monitor blood pressure closely -Hydralazine as needed for blood pressure more than 160/100.  Hyponatremia: Sodium of 132 -Sodium correction for hyperglycemia is 134 -Repeat BMP tomorrow a.m.  DVT prophylaxis: Lovenox/SCD Code Status: Full code  family Communication: None present at bedside.  Plan of care discussed with patient in length and he verbalized understanding and agreed with it. Disposition Plan: Home and 2 to 3 days Consults called: None Admission status: Inpatient   Mckinley Jewel MD [facility]  If 7PM-7AM, please contact night-coverage [email or web address] Password [facility]  09/28/2019, 2:23 PM

## 2019-09-28 NOTE — Chronic Care Management (AMB) (Signed)
Chronic Care Management    Social Work Follow Up Note  09/28/2019 Name: [name] MRN: [id number] DOB: [birth year]  [name] is a 64 y.o. year old male who is a primary care patient of [name], [place]. The CCM team was consulted for assistance with care coordination.   Review of patient status, including review of consultants reports, other relevant assessments, and collaboration with appropriate care team members and the patient's provider was performed as part of comprehensive patient evaluation and provision of chronic care management services.    SDOH (Social Determinants of Health) assessments performed: No    Facility-Administered Encounter Medications as of 09/28/2019  Medication  . acetaminophen (TYLENOL) tablet 650 mg   Or  . acetaminophen (TYLENOL) suppository 650 mg  . aspirin EC tablet 81 mg  . [START ON 09/29/2019] atorvastatin (LIPITOR) tablet 20 mg  . cefTRIAXone (ROCEPHIN) 1 g in sodium chloride 0.9 % 100 mL IVPB  . enoxaparin (LOVENOX) injection 40 mg  . insulin aspart (novoLOG) injection 0-15 Units  . insulin aspart (novoLOG) injection 0-5 Units  . [START ON 09/29/2019] metoprolol tartrate (LOPRESSOR) tablet 50 mg  . ondansetron (ZOFRAN) tablet 4 mg   Or  . ondansetron (ZOFRAN) injection 4 mg   Outpatient Encounter Medications as of 09/28/2019  Medication Sig  . acetaminophen (TYLENOL) 500 MG tablet Take 500 mg by mouth every 6 (six) hours as needed for mild pain or moderate pain.  Marland Kitchen aspirin EC 81 MG tablet Take 81 mg by mouth daily.  Marland Kitchen atorvastatin (LIPITOR) 20 MG tablet TAKE 1 TABLET BY MOUTH EVERY DAY  . fluconazole (DIFLUCAN) 100 MG tablet Take 1 tablet (100 mg total) by mouth daily. Take 1 tablet by mouth now repeat in 5 days (Patient not taking: Reported on 09/28/2019)  . guaiFENesin (MUCINEX) 600 MG 12 hr tablet Take 1 tablet (600 mg total) by mouth 2 (two) times daily. (Patient not taking: Reported on 09/28/2019)  . metoprolol tartrate (LOPRESSOR) 50  MG tablet TAKE 1 TABLET BY MOUTH EVERY DAY  . Multiple Vitamin (MULTIVITAMIN) tablet Take 1 tablet by mouth daily.  Marland Kitchen MYRBETRIQ 25 MG TB24 tablet TAKE 1 TABLET BY MOUTH EVERY DAY  . nystatin (NYSTATIN) powder Apply 1 application topically 3 (three) times daily.  Marland Kitchen olmesartan-hydrochlorothiazide (BENICAR HCT) 40-25 MG tablet TAKE 1 TABLET BY MOUTH EVERY DAY  . RYBELSUS 14 MG TABS TAKE 1 TABLET BY MOUTH DAILY. 30 MINUTES BEFORE BREAKFAST  . sildenafil (REVATIO) 20 MG tablet TAKE 1 TABLET BY MOUTH EVERY DAY (PRIOR AUTHORIZATION DENIED)  . TRADJENTA 5 MG TABS tablet TAKE 1 TABLET BY MOUTH EVERY DAY  . traMADol (ULTRAM) 50 MG tablet Take 1 tablet (50 mg total) by mouth every 6 (six) hours as needed for severe pain. (Patient not taking: Reported on 09/28/2019)  . traZODone (DESYREL) 50 MG tablet TAKE 1 TABLET BY MOUTH EVERYDAY AT BEDTIME (Patient not taking: Reported on 09/28/2019)     Goals Addressed              This Visit's Progress     Patient Stated   .  "I need help getting into an assisted living facility" (pt-stated)        [place] (see longitudinal plan of care for additional care plan information)  Current Barriers:  Marland Kitchen Knowledge Deficits related to process for admission into an ALF . Chronic Disease Management support and education needs related to CKD III, DM II, HTN  Nurse Case Manager Clinical Goal(s):  .  Over the next 90 days, patient will work with embedded BSW and PCP to address needs related to assistance for admission into an ALF  CCM SW Interventions: Completed 09/28/19 . Collaboration with patient primary care provider who reports she has yet to complete Fl2 at this time . SW advised patient PCP SW has been provided an updated Fl2 form from the state of [place]  . Provided updated Fl2 form to patient primary care provider for completion  Completed 09/24/19 . Successful outbound call placed to the patient to advise paperwork has been sent to provider for  completion . Determined the patient is no longer wanting to [facility] into [facility] and is considering SNF versus ALF . Educated the patient on the difference in levels of care . Determined the patient feels he is most appropriate for ALF but would rather look elsewhere . Advised the patient SW would send patient Fl2 to local ALF who accept special assistance to seek bed offer . Collaboration with patient primary provider to request completed paperwork be sent directly to SW to send out seeking bed offers . SW to continue to follow  CCM RN CM Interventions:  . Inter-disciplinary care team collaboration (see longitudinal plan of care) . Determined patient would like to move into an ALF and would like assistance from the embedded BSW and PCP . Collaborated with embedded BSW [name] regarding patient's request for assistance with admission into an ALF . Discussed plans with patient for ongoing care management follow up and provided patient with direct contact information for care management team  Patient Self Care Activities:  . Self administers medications as prescribed . Attends all scheduled provider appointments . Calls pharmacy for medication refills . Calls provider office for new concerns or questions  Please see past updates related to this goal by clicking on the "Past Updates" button in the selected goal          Follow Up Plan: SW will follow up with patient by phone over the next two weeks.   [name], BSW, CDP Social Worker, Certified Dementia Practitioner [place] [facility] [place] [facility] [phone]  Total time spent performing care coordination and/or care management activities with the patient by phone or face to face = 7 minutes.

## 2019-09-29 ENCOUNTER — Encounter (HOSPITAL_COMMUNITY): Payer: Self-pay | Admitting: Internal Medicine

## 2019-09-29 ENCOUNTER — Telehealth: Payer: Medicare Other

## 2019-09-29 DIAGNOSIS — I10 Essential (primary) hypertension: Secondary | ICD-10-CM

## 2019-09-29 DIAGNOSIS — E871 Hypo-osmolality and hyponatremia: Secondary | ICD-10-CM | POA: Diagnosis not present

## 2019-09-29 DIAGNOSIS — N179 Acute kidney failure, unspecified: Secondary | ICD-10-CM

## 2019-09-29 DIAGNOSIS — N1831 Chronic kidney disease, stage 3a: Secondary | ICD-10-CM | POA: Diagnosis not present

## 2019-09-29 DIAGNOSIS — J1282 Pneumonia due to coronavirus disease 2019: Secondary | ICD-10-CM | POA: Diagnosis present

## 2019-09-29 DIAGNOSIS — U071 COVID-19: Secondary | ICD-10-CM | POA: Diagnosis present

## 2019-09-29 LAB — CBG MONITORING, ED
Glucose-Capillary: 100 mg/dL — ABNORMAL HIGH (ref 70–99)
Glucose-Capillary: 147 mg/dL — ABNORMAL HIGH (ref 70–99)

## 2019-09-29 LAB — COMPREHENSIVE METABOLIC PANEL
ALT: 24 U/L (ref 0–44)
AST: 17 U/L (ref 15–41)
Albumin: 3.1 g/dL — ABNORMAL LOW (ref 3.5–5.0)
Alkaline Phosphatase: 94 U/L (ref 38–126)
Anion gap: 14 (ref 5–15)
BUN: 48 mg/dL — ABNORMAL HIGH (ref 8–23)
CO2: 19 mmol/L — ABNORMAL LOW (ref 22–32)
Calcium: 9 mg/dL (ref 8.9–10.3)
Chloride: 106 mmol/L (ref 98–111)
Creatinine, Ser: 1.76 mg/dL — ABNORMAL HIGH (ref 0.61–1.24)
GFR calc Af Amer: 46 mL/min — ABNORMAL LOW (ref >60)
GFR calc non Af Amer: 40 mL/min — ABNORMAL LOW (ref >60)
Glucose, Bld: 94 mg/dL (ref 70–99)
Potassium: 5 mmol/L (ref 3.5–5.1)
Sodium: 139 mmol/L (ref 135–145)
Total Bilirubin: 0.6 mg/dL (ref 0.3–1.2)
Total Protein: 7.3 g/dL (ref 6.5–8.1)

## 2019-09-29 LAB — CBC
HCT: 50.1 % (ref 39.0–52.0)
Hemoglobin: 16.4 g/dL (ref 13.0–17.0)
MCH: 27.4 pg (ref 26.0–34.0)
MCHC: 32.7 g/dL (ref 30.0–36.0)
MCV: 83.6 fL (ref 80.0–100.0)
Platelets: 205 10*3/uL (ref 150–400)
RBC: 5.99 MIL/uL — ABNORMAL HIGH (ref 4.22–5.81)
RDW: 13.2 % (ref 11.5–15.5)
WBC: 6.3 10*3/uL (ref 4.0–10.5)
nRBC: 0 % (ref 0.0–0.2)

## 2019-09-29 LAB — D-DIMER, QUANTITATIVE: D-Dimer, Quant: 1.18 ug/mL-FEU — ABNORMAL HIGH (ref 0.00–0.50)

## 2019-09-29 LAB — C-REACTIVE PROTEIN: CRP: 3.9 mg/dL — ABNORMAL HIGH (ref <1.0)

## 2019-09-29 LAB — GLUCOSE, CAPILLARY
Glucose-Capillary: 151 mg/dL — ABNORMAL HIGH (ref 70–99)
Glucose-Capillary: 154 mg/dL — ABNORMAL HIGH (ref 70–99)

## 2019-09-29 LAB — FERRITIN: Ferritin: 254 ng/mL (ref 24–336)

## 2019-09-29 LAB — MAGNESIUM: Magnesium: 2.4 mg/dL (ref 1.7–2.4)

## 2019-09-29 LAB — PHOSPHORUS: Phosphorus: 2.8 mg/dL (ref 2.5–4.6)

## 2019-09-29 NOTE — ED Notes (Signed)
Patient given gingerale and Tylenol for nausea and pain.

## 2019-09-29 NOTE — ED Notes (Signed)
Report given to [facility] nurse

## 2019-09-29 NOTE — Progress Notes (Signed)
ASANI MCBURNEY [id number] Admission Data: 09/29/2019 3:55 PM Attending Provider: Jennye Boroughs, MD  WGN:FAOZH, [name], [place] Consults/ Treatment Team:   MONTANA FASSNACHT is a 64 y.o. male patient admitted from ED awake, alert  & orientated  X 3,  Full Code, VSS - Blood pressure 136/79, pulse 69, temperature 99.7 F (37.6 C), temperature source Axillary, resp. rate 17, height 5\' 6"  (1.676 m), weight 77.1 kg, SpO2 97 %., O2  no c/o shortness of breath, no c/o chest pain, no distress noted. Tele # 21 placed and pt is currently running:normal sinus rhythm.   IV site WDL:  wrist right, condition patent and no redness and left, condition patent and no redness and antecubital right, condition patent and no redness and left, condition patent and no redness with a transparent dsg that's clean dry and intact.  Allergies:  No Known Allergies   Past Medical History:  Diagnosis Date  . Closed nondisplaced spiral fracture of shaft of right tibia   . Complicated urinary tract infection 09/06/2016  . Depression   . Diabetes mellitus without complication ([place])   . GERD (gastroesophageal reflux disease)   . Gout    right foot  . Hemorrhoids   . Hypertension   . Hypertension   . Macular degeneration   . Microcytic anemia   . Prostate cancer ([place]) 08/2009  . Rectal bleeding 07/31/2016  . Tubular adenoma      Pt orientation to unit, room and routine. Information packet given to patient/family and safety video watched.  Admission INP armband ID verified with patient/family, and in place. SR up x 2, fall risk assessment complete with Patient and family verbalizing understanding of risks associated with falls. Pt verbalizes an understanding of how to use the call bell and to call for help before getting out of bed.  Skin, clean-dry- intact without evidence of bruising, or skin tears.   No evidence of skin break down noted on exam.    Will cont to monitor and assist as needed.  Viktorya Arguijo Shelda Pal,  RN 09/29/2019 3:55 PM

## 2019-09-29 NOTE — ED Notes (Signed)
Patient now with chills and nausea.

## 2019-09-29 NOTE — Progress Notes (Addendum)
Progress Note    [name]  PYP:[id number] DOB: 03-06-1956  DOA: 09/28/2019 PCP: [name], [name]      Brief Narrative:    Medical records reviewed and are as summarized below:  [name] is a 64 y.o. male  with medical history significant of  BPH with obstruction, prostate cancer on active surveillance, hypertension, hyperlipidemia, diabetes mellitus, GERD presented to emergency department with generalized weakness, nausea and vomiting for about 1 week duration.      Assessment/Plan:   Principal Problem:   Pneumonia due to COVID-19 virus Active Problems:   Dyslipidemia associated with type 2 diabetes mellitus ([place])   Type 2 diabetes mellitus with diabetic nephropathy, without long-term current use of insulin (HCC)   Essential hypertension   Acute on chronic kidney failure (HCC)   Hyponatremia   Sinus tachycardia  COVID-19 infection/pneumonia: Continue IV remdesivir.  He is tolerating room air.  No steroids for now.  Antiemetics as needed for nausea and vomiting.  He said he got 1 dose of [place] Covid vaccine 2 weeks prior to admission and was scheduled to have his second dose on October 16, 2019.  AKI on CKD stage IIIa with metabolic acidosis: Bicarbonate level is improving.  Monitor creatinine closely.  Prostate cancer, BPH with obstruction, moderate to severe right hydronephrosis, moderate to severe prostatic enlargement with indentation of bladder base: Patient said he is aware of these chronic medical problems when he follows up with a urologist as an outpatient.  Hyponatremia: Improved  No evidence of UTI: Discontinue IV Rocephin.  Body mass index is 27.44 kg/m.  Diet Order            Diet Carb Modified Fluid consistency: Thin; Room service appropriate? Yes  Diet effective now                       Medications:   . vitamin C  500 mg Oral Daily  . aspirin EC  81 mg Oral Daily  . atorvastatin  20 mg Oral Daily  . enoxaparin (LOVENOX)  injection  40 mg Subcutaneous Q24H  . insulin aspart  0-15 Units Subcutaneous TID WC  . insulin aspart  0-5 Units Subcutaneous QHS  . metoprolol tartrate  50 mg Oral Daily  . zinc sulfate  220 mg Oral Daily   Continuous Infusions: . cefTRIAXone (ROCEPHIN)  IV Stopped (09/28/19 1626)  . remdesivir 100 mg in NS 100 mL 100 mg (09/29/19 1132)     Anti-infectives (From admission, onward)   Start     Dose/Rate Route Frequency Ordered Stop   09/29/19 1000  remdesivir 100 mg in sodium chloride 0.9 % 100 mL IVPB     Discontinue    "Followed by" Linked Group Details   100 mg 200 mL/hr over 30 Minutes Intravenous Daily 09/28/19 1624 10/03/19 0959   09/28/19 1630  remdesivir 200 mg in sodium chloride 0.9% 250 mL IVPB       "Followed by" Linked Group Details   200 mg 580 mL/hr over 30 Minutes Intravenous Once 09/28/19 1624 09/28/19 1915   09/28/19 1500  cefTRIAXone (ROCEPHIN) 1 g in sodium chloride 0.9 % 100 mL IVPB     Discontinue     1 g 200 mL/hr over 30 Minutes Intravenous Every 24 hours 09/28/19 1448               Family Communication/Anticipated D/C date and plan/Code Status   DVT prophylaxis: enoxaparin (LOVENOX) injection  40 mg Start: 09/28/19 1430 SCDs Start: 09/28/19 1417     Code Status: Full Code  Family Communication: Plan discussed with patient Disposition Plan:    Status is: Inpatient  Remains inpatient appropriate because:IV treatments appropriate due to intensity of illness or inability to take PO and Inpatient level of care appropriate due to severity of illness   Dispo: The patient is from: Home              Anticipated d/c is to: Home              Anticipated d/c date is: 3 days              Patient currently is not medically stable to d/c.           Subjective:   C/o cough with congestion, generalized weakness and fatigue.  No shortness of breath, abdominal pain or pelvic pain.  He is able to produce urine without any problems.  Objective:     Vitals:   09/29/19 0700 09/29/19 0900 09/29/19 0935 09/29/19 1130  BP: (!) 133/94 (!) 147/94  (!) 144/94  Pulse: 77 77  70  Resp: 19 19  16   Temp:   (!) 97.5 F (36.4 C)   TempSrc:   Oral   SpO2: 95% 95%  96%  Weight:      Height:       No data found.   Intake/Output Summary (Last 24 hours) at 09/29/2019 1208 Last data filed at 09/28/2019 1915 Gross per 24 hour  Intake 1850 ml  Output 400 ml  Net 1450 ml   Filed Weights   09/28/19 1214  Weight: 77.1 kg    Exam:  GEN: NAD SKIN: No rash EYES: EOMI ENT: MMM CV: RRR PULM: Basilar rales. no wheezing heard ABD: soft, obese, NT, +BS CNS: AAO x 3, non focal EXT: No edema or tenderness   Data Reviewed:   I have personally reviewed following labs and imaging studies:  Labs: Labs show the following:   Basic Metabolic Panel: Recent Labs  Lab 09/28/19 1220 09/28/19 1432 09/29/19 0436  NA 132*  --  139  K 4.4  --  5.0  CL 101  --  106  CO2 14*  --  19*  GLUCOSE 208*  --  94  BUN 59*  --  48*  CREATININE 2.12* 2.08* 1.76*  CALCIUM 9.2  --  9.0  MG  --  2.6* 2.4  PHOS  --  2.7 2.8   GFR Estimated Creatinine Clearance: 40.9 mL/min (A) (by C-G formula based on SCr of 1.76 mg/dL (H)). Liver Function Tests: Recent Labs  Lab 09/28/19 1220 09/29/19 0436  AST 19 17  ALT 26 24  ALKPHOS 100 94  BILITOT 1.5* 0.6  PROT 7.9 7.3  ALBUMIN 3.3* 3.1*   Recent Labs  Lab 09/28/19 1432  LIPASE 30   No results for input(s): AMMONIA in the last 168 hours. Coagulation profile Recent Labs  Lab 09/28/19 1220  INR 1.0    CBC: Recent Labs  Lab 09/28/19 1220 09/28/19 1432 09/29/19 0436  WBC 7.5 7.4 6.3  NEUTROABS 6.2  --   --   HGB 17.1* 17.3* 16.4  HCT 52.0 53.0* 50.1  MCV 83.3 83.5 83.6  PLT 208 201 205   Cardiac Enzymes: No results for input(s): CKTOTAL, CKMB, CKMBINDEX, TROPONINI in the last 168 hours. BNP (last 3 results) No results for input(s): PROBNP in the last 8760 hours. CBG: Recent  Labs  Lab 09/28/19 1711 09/28/19 2206 09/29/19 0827 09/29/19 1130  GLUCAP 122* 124* 100* 147*   D-Dimer: Recent Labs    09/28/19 1700 09/29/19 0436  DDIMER 0.89* 1.18*   Hgb A1c: No results for input(s): HGBA1C in the last 72 hours. Lipid Profile: No results for input(s): CHOL, HDL, LDLCALC, TRIG, CHOLHDL, LDLDIRECT in the last 72 hours. Thyroid function studies: Recent Labs    09/28/19 1432  TSH 0.146*   Anemia work up: Recent Labs    09/28/19 1700 09/29/19 0436  FERRITIN 240 254   Sepsis Labs: Recent Labs  Lab 09/28/19 1220 09/28/19 1432 09/28/19 1700 09/29/19 0436  PROCALCITON  --   --  0.13  --   WBC 7.5 7.4  --  6.3  LATICACIDVEN  --  1.2  --   --     Microbiology Recent Results (from the past 240 hour(s))  SARS Coronavirus 2 by RT PCR (hospital order, performed in [place] hospital lab) Nasopharyngeal Nasopharyngeal Swab     Status: Abnormal   Collection Time: 09/28/19 12:20 PM   Specimen: Nasopharyngeal Swab  Result Value Ref Range Status   SARS Coronavirus 2 POSITIVE (A) NEGATIVE Final    Comment: RESULT CALLED TO, READ BACK BY AND VERIFIED WITH: RN M. COCHRANE [id number] FCP (NOTE) SARS-CoV-2 target nucleic acids are DETECTED  SARS-CoV-2 RNA is generally detectable in upper respiratory specimens  during the acute phase of infection.  Positive results are indicative  of the presence of the identified virus, but do not rule out bacterial infection or co-infection with other pathogens not detected by the test.  Clinical correlation with patient history and  other diagnostic information is necessary to determine patient infection status.  The expected result is negative.  Fact Sheet for Patients:   StrictlyIdeas.no   Fact Sheet for Healthcare Providers:   BankingDealers.co.za    This test is not yet approved or cleared by the [place] FDA and  has been authorized for detection and/or  diagnosis of SARS-CoV-2 by FDA under an Emergency Use Authorization (EUA).  This EUA will remain in effect (meaning this test c an be used) for the duration of  the COVID-19 declaration under Section 564(b)(1) of the Act, 21 U.S.C. section 360-bbb-3(b)(1), unless the authorization is terminated or revoked sooner.  Performed at [facility], [place] [address], [place], [place] [address]     Procedures and diagnostic studies:  CT ABDOMEN PELVIS WO CONTRAST  Result Date: 09/28/2019 CLINICAL DATA:  Flank pain, nausea, acute renal failure EXAM: CT ABDOMEN AND PELVIS WITHOUT CONTRAST TECHNIQUE: Multidetector CT imaging of the abdomen and pelvis was performed following the standard protocol without IV contrast. COMPARISON:  09/30/2016 FINDINGS: Lower chest: There are asymmetric areas of peripheral ground-glass infiltrate and pulmonary consolidation within the visualized lung bases most in keeping with changes of atypical infection and suggestive of acute COVID-19 pneumonia. No pleural effusion. Moderate to severe coronary artery calcification. Global cardiac size within normal limits. Hepatobiliary: Liver and gallbladder are unremarkable. Pancreas: Unremarkable. Spleen: Unremarkable. Adrenals/Urinary Tract: The adrenal glands are unremarkable. The kidneys are normal in position. Surgical changes of right psoas hitch are identified. Since the prior examination, there has developed marked asymmetric right renal cortical atrophy. Moderate to severe right hydronephrosis and hydroureter is seen extending to the level of the ureteral anastomosis within the right retroperitoneum. 3 mm nonobstructing calculus is seen within the lower pole of the right kidney. Multiple simple cortical cysts are for identified within  the left kidney. No renal or ureteral calculi on the left. No hydronephrosis on the left. The bladder is mildly thick wall suggesting changes of chronic bladder outlet obstruction. Stomach/Bowel: The  large and small bowel are unremarkable. Appendix normal. The stomach is unremarkable. No free intraperitoneal gas or fluid. Vascular/Lymphatic: There is no pathologic abdominal or pelvic lymphadenopathy. The abdominal vasculature is age-appropriate on this noncontrast examination. Reproductive: The prostate gland is moderate to severely enlarged with indentation of the base of the bladder. Other: Small bilateral fat containing inguinal hernias are present. The rectum is unremarkable. Musculoskeletal: No acute bone abnormality. IMPRESSION: Bibasilar pulmonary infiltrates most in keeping with atypical infection and most suggestive of acute COVID-19 pneumonia. Surgical changes of right psoas hitch with apparent obstruction at the surgical anastomosis and moderate to severe right hydronephrosis. This is likely long-standing with interval development of a severe right renal cortical atrophy. Moderate to severe prosthetic enlargement with indentation of the bladder base. Chronic bladder outlet obstruction suspected with mild circumferential bladder wall thickening noted. Minimal nonobstructing right nephrolithiasis. Electronically Signed   By: Fidela Salisbury MD   On: 09/28/2019 18:05   CT Head Wo Contrast  Result Date: 09/28/2019 CLINICAL DATA:  Altered mental status with nausea and vomiting EXAM: CT HEAD WITHOUT CONTRAST TECHNIQUE: Contiguous axial images were obtained from the base of the skull through the vertex without intravenous contrast. COMPARISON:  March 22, 2016 FINDINGS: Brain: Moderate diffuse atrophy is stable. Prominence of the cisterna magna is an anatomic variant. There is no intracranial mass, hemorrhage, extra-axial fluid collection, or midline shift. There is small vessel disease in the centra semiovale bilaterally, stable from prior study. No acute infarct evident. Vascular: No hyperdense vessel. There is calcification in each distal vertebral and carotid siphon region. Skull: Bony calvarium  appears intact. Sinuses/Orbits: There is opacification in the left and to a lesser extent right sphenoid sinus regions. There is opacification in multiple ethmoid air cells. There is also mucosal thickening in visualized maxillary antra, more on the left than the right. There is mucosal thickening in the inferior left frontal sinus. Orbits appear symmetric bilaterally. Other: Mastoid air cells are clear. IMPRESSION: Stable atrophy with periventricular small vessel disease. No acute infarct evident. No appreciable mass or hemorrhage. There are foci of arterial vascular calcification. There are multiple foci of paranasal sinus disease. Electronically Signed   By: Lowella Grip III M.D.   On: 09/28/2019 13:02   DG Chest Port 1 View  Result Date: 09/28/2019 CLINICAL DATA:  Weakness EXAM: PORTABLE CHEST 1 VIEW COMPARISON:  2018 FINDINGS: Low lung volumes. Patchy density at the right lung base. Mild chronic interstitial prominence. No pleural effusion or pneumothorax. Heart size is normal for portable technique. IMPRESSION: Right lung base patchy atelectasis/consolidation. Patchy density was also present on 2018 study therefore scarring is also a consideration. Electronically Signed   By: Macy Mis M.D.   On: 09/28/2019 12:57               LOS: 1 day   Tashi Andujo  [facility]     09/29/2019, 12:08 PM

## 2019-09-29 NOTE — ED Notes (Signed)
Patient complaining of back pain at this time.  He states that he would like some tylenol for the pain.

## 2019-09-29 NOTE — ED Notes (Signed)
Pt appears to be resting comfortable.  No signs of distress at this time.

## 2019-09-30 ENCOUNTER — Other Ambulatory Visit: Payer: Self-pay | Admitting: Nurse Practitioner

## 2019-09-30 DIAGNOSIS — J1282 Pneumonia due to Coronavirus disease 2019: Secondary | ICD-10-CM | POA: Diagnosis not present

## 2019-09-30 DIAGNOSIS — I10 Essential (primary) hypertension: Secondary | ICD-10-CM | POA: Diagnosis not present

## 2019-09-30 DIAGNOSIS — U071 COVID-19: Secondary | ICD-10-CM | POA: Diagnosis not present

## 2019-09-30 LAB — GLUCOSE, CAPILLARY
Glucose-Capillary: 134 mg/dL — ABNORMAL HIGH (ref 70–99)
Glucose-Capillary: 95 mg/dL (ref 70–99)
Glucose-Capillary: 176 mg/dL — ABNORMAL HIGH (ref 70–99)
Glucose-Capillary: 220 mg/dL — ABNORMAL HIGH (ref 70–99)

## 2019-09-30 LAB — URINE CULTURE: Culture: NO GROWTH

## 2019-09-30 LAB — D-DIMER, QUANTITATIVE: D-Dimer, Quant: 0.97 ug/mL-FEU — ABNORMAL HIGH (ref 0.00–0.50)

## 2019-09-30 LAB — MAGNESIUM: Magnesium: 2.3 mg/dL (ref 1.7–2.4)

## 2019-09-30 LAB — C-REACTIVE PROTEIN: CRP: 2.5 mg/dL — ABNORMAL HIGH (ref <1.0)

## 2019-09-30 LAB — PHOSPHORUS: Phosphorus: 3 mg/dL (ref 2.5–4.6)

## 2019-09-30 LAB — FERRITIN: Ferritin: 257 ng/mL (ref 24–336)

## 2019-09-30 MED ORDER — DEXAMETHASONE SODIUM PHOSPHATE 10 MG/ML IJ SOLN
6.0000 mg | INTRAMUSCULAR | Status: DC
  Administered 2019-09-30 – 2019-10-01 (×2): 6 mg via INTRAVENOUS
  Filled 2019-09-30 (×2): qty 1

## 2019-09-30 MED ORDER — HYDROCORTISONE ACETATE 25 MG RE SUPP
25.0000 mg | Freq: Two times a day (BID) | RECTAL | Status: DC
  Administered 2019-09-30 – 2019-10-04 (×6): 25 mg via RECTAL
  Filled 2019-09-30 (×7): qty 1

## 2019-09-30 NOTE — Progress Notes (Signed)
Requested hemorrhoid cream from provider

## 2019-09-30 NOTE — Evaluation (Signed)
Physical Therapy Evaluation Patient Details Name: [name] MRN: [id number] DOB: Jul 16, 1955 Today's Date: 09/30/2019   History of Present Illness  [name] is a 64 y.o. male with medical history significant of BPH with obstruction, prostate cancer on active surveillance, hypertension, hyperlipidemia, diabetes mellitus, GERD presented to emergency department with generalized weakness, nausea and vomiting for about 1 week duration.Pt dx with COVID 19 with acute hypoxic respiratory failure, AKI on CKD stage III, and hyponatremia.    Clinical Impression  Pt is unsteady on his feet requiring min assist for several LOB during gait.  He refused to use the RW for stability and does not have his R AFO with him that I could find.  He reports stumbles at baseline and is willing to try a round of home therapy if he qualifies to help with gait training and reducing fall risk.   PT to follow acutely for deficits listed below.      Follow Up Recommendations Home health PT    Equipment Recommendations  Rolling walker with 5" wheels    Recommendations for Other Services       Precautions / Restrictions Precautions Precautions: Fall Precaution Comments: R foot drop, h/o falls Required Braces or Orthoses: Other Brace Other Brace: Reports he has an AFO, but I cannot find it in his room.  Restrictions Weight Bearing Restrictions: No      Mobility  Bed Mobility Overal bed mobility: Modified Independent                Transfers Overall transfer level: Needs assistance   Transfers: Sit to/from Stand Sit to Stand: Min guard         General transfer comment: Min guard assist mostly for balance.   Ambulation/Gait Ambulation/Gait assistance: Min assist Gait Distance (Feet): 40 Feet (20'x2 with seated rest d/t reports of weakness)   Gait Pattern/deviations: Step-through pattern;Decreased dorsiflexion - right;Steppage     General Gait Details: Pt with unsteady gait pattern,  compenasting for lack of DF R ankle, unstable at his right knee (which AFO likely helps with).  He did not want to use a walker when I offered it for more stability.  Stairs            Wheelchair Mobility    Modified Rankin (Stroke Patients Only)       Balance Overall balance assessment: Needs assistance Sitting-balance support: Feet supported;No upper extremity supported Sitting balance-Leahy Scale: Fair     Standing balance support: Single extremity supported Standing balance-Leahy Scale: Poor Standing balance comment: needs external support in standing.                              Pertinent Vitals/Pain Pain Assessment: No/denies pain    Home Living Family/patient expects to be discharged to:: Private residence Living Arrangements: Alone Available Help at Discharge: Personal care attendant;Friend(s);Available PRN/intermittently (4xs/wk 3 hours per day) Type of Home: Apartment Home Access: Level entry     Home Layout: Two level Home Equipment: Grab bars - tub/shower;Shower seat      Prior Function Level of Independence: Needs assistance   Gait / Transfers Assistance Needed: reports independent ambulation, h/o stumbles and one recent fall     Comments: likes to swim in his friend's pool     Hand Dominance   Dominant Hand: Right    Extremity/Trunk Assessment   Upper Extremity Assessment Upper Extremity Assessment: Defer to OT evaluation    Lower  Extremity Assessment Lower Extremity Assessment: RLE deficits/detail RLE Deficits / Details: at baseline R LE has foot drop for which he wears and AFO.  AFO not present at time of eval, but would have likely made him a bit more stable on his feet. Pt reports foot drop is from back issues, not stroke    Cervical / Trunk Assessment Cervical / Trunk Assessment: Other exceptions Cervical / Trunk Exceptions: h/o low back issues leading to R foot drop  Communication   Communication: No difficulties   Cognition Arousal/Alertness: Awake/alert Behavior During Therapy: Flat affect Overall Cognitive Status: No family/caregiver present to determine baseline cognitive functioning                                 General Comments: I am not sure of pt's baseline, however, he is slow to process, does not make great eye contact.       General Comments General comments (skin integrity, edema, etc.): Despite reporting weakness and dizziness VSS throughout on RA during gait and during seated rest between walks.  BP was mildly elevated, not low. 150s/70s    Exercises     Assessment/Plan    PT Assessment Patient needs continued PT services  PT Problem List Decreased strength;Decreased activity tolerance;Decreased balance;Decreased mobility;Decreased cognition;Decreased knowledge of use of DME;Decreased safety awareness;Decreased knowledge of precautions;Cardiopulmonary status limiting activity       PT Treatment Interventions DME instruction;Gait training;Stair training;Functional mobility training;Therapeutic activities;Therapeutic exercise;Balance training;Patient/family education;Cognitive remediation    PT Goals (Current goals can be found in the Care Plan section)  Acute Rehab PT Goals Patient Stated Goal: to go back home when he can PT Goal Formulation: With patient Time For Goal Achievement: 10/14/19 Potential to Achieve Goals: Good    Frequency Min 3X/week   Barriers to discharge        Co-evaluation               AM-PAC PT "6 Clicks" Mobility  Outcome Measure Help needed turning from your back to your side while in a flat bed without using bedrails?: None Help needed moving from lying on your back to sitting on the side of a flat bed without using bedrails?: None Help needed moving to and from a bed to a chair (including a wheelchair)?: A Little Help needed standing up from a chair using your arms (e.g., wheelchair or bedside chair)?: A Little Help needed  to walk in hospital room?: A Little Help needed climbing 3-5 steps with a railing? : A Little 6 Click Score: 20    End of Session   Activity Tolerance: Patient limited by fatigue Patient left: in bed;with call bell/phone within reach;with bed alarm set Nurse Communication: Mobility status PT Visit Diagnosis: Muscle weakness (generalized) (M62.81);Difficulty in walking, not elsewhere classified (R26.2)    Time: 8889-1694 PT Time Calculation (min) (ACUTE ONLY): 35 min   Charges:   PT Evaluation $PT Eval Moderate Complexity: 1 Mod PT Treatments $Gait Training: 8-22 mins      Verdene Lennert, PT, DPT  Acute Rehabilitation [phone] pager [phone]) [phone] office

## 2019-09-30 NOTE — Progress Notes (Addendum)
Occupational Therapy Evaluation Patient Details Name: [name] MRN: [id number] DOB: 01-16-1956 Today's Date: 09/30/2019    History of Present Illness [name] is a 64 y.o. male with medical history significant of BPH with obstruction, prostate cancer on active surveillance, hypertension, hyperlipidemia, diabetes mellitus, GERD presented to emergency department with generalized weakness, nausea and vomiting for about 1 week duration.   Clinical Impression   Patient lives in an apartment alone and has assist from aide 4days/week.  Gets help with home management and shower transfers.  Today patient presenting with decreased cognition, strength, and balance.  Patient is generally "foggy" and processing is slow.  Required cueing for sequencing and using items correctly during ADLs.  Patient on RA and SpO2 remained >95. Performed UB ADLs with min assist and LB with mod.  Patient able to stand with min guard but walking is unsteady, would likely benefit from RW.  Patient needing more assist than he currently has at home to be safe.  Will continue to follow with OT acutely to address the deficits listed below.    Follow Up Recommendations  SNF;Supervision/Assistance - 24 hour;Other (comment) (If patient can recieve more assist at home then [facility])    Equipment Recommendations  Other (comment) (RW)    Recommendations for Other Services       Precautions / Restrictions        Mobility Bed Mobility               General bed mobility comments: sitting EOB on arrival  Transfers Overall transfer level: Needs assistance Equipment used: 1 person hand held assist Transfers: Sit to/from Stand Sit to Stand: Min guard              Balance Overall balance assessment: Needs assistance Sitting-balance support: No upper extremity supported;Feet supported Sitting balance-Leahy Scale: Fair     Standing balance support: Single extremity supported Standing balance-Leahy Scale:  Poor Standing balance comment: Reaching for surfaces and using one hand assist from therapist                           ADL either performed or assessed with clinical judgement   ADL Overall ADL's : Needs assistance/impaired Eating/Feeding: Independent;Sitting   Grooming: Minimal assistance;Cueing for sequencing;Cueing for safety;Standing Grooming Details (indicate cue type and reason): Supervision for standing but needing assist with sequencing and figuring out how to use tools/items Upper Body Bathing: Minimal assistance;Standing   Lower Body Bathing: Moderate assistance;Sit to/from stand   Upper Body Dressing : Minimal assistance;Sitting   Lower Body Dressing: Moderate assistance;Sit to/from stand Lower Body Dressing Details (indicate cue type and reason): Limited ROM L knee and not able to don sock Toilet Transfer: Minimal assistance;Ambulation;Regular Toilet   Toileting- Clothing Manipulation and Hygiene: Minimal assistance;Sitting/lateral lean       Functional mobility during ADLs: Minimal assistance General ADL Comments: Unsteady with mobility and cognition limiting independence     Vision Patient Visual Report: No change from baseline       Perception     Praxis      Pertinent Vitals/Pain Pain Assessment: Faces Faces Pain Scale: Hurts little more Pain Location: Chronic back pain Pain Descriptors / Indicators: Aching;Discomfort Pain Intervention(s): Limited activity within patient's tolerance;Monitored during session;Repositioned     Hand Dominance Right   Extremity/Trunk Assessment Upper Extremity Assessment Upper Extremity Assessment: Generalized weakness   Lower Extremity Assessment Lower Extremity Assessment: Generalized weakness  Communication Communication Communication: No difficulties   Cognition Arousal/Alertness: Awake/alert Behavior During Therapy: Flat affect Overall Cognitive Status: No family/caregiver present to  determine baseline cognitive functioning Area of Impairment: Memory;Following commands;Problem solving;Safety/judgement;Awareness                     Memory: Decreased short-term memory Following Commands: Follows one step commands with increased time Safety/Judgement: Decreased awareness of safety Awareness: Intellectual Problem Solving: Slow processing;Decreased initiation;Difficulty sequencing;Requires verbal cues;Requires tactile cues General Comments: Very foggy. Slow processing, difficulty answering simple home set up/history questions.  Often chnged answers when asked multiple times or stted he was not sure and then later answered.  Unsure how to get soap out of bottle.  Rubbed soap bottle on washcloth like it was a bar of soap   General Comments  During bathing at sink patient had bleeding from hemrhoids, significant enough that it was dripping on floor.  RN alerted    Exercises     Shoulder Instructions      Home Living Family/patient expects to be discharged to:: Private residence Living Arrangements: Alone Available Help at Discharge: Personal care attendant (4 days/week, 3hours/day) Type of Home: Apartment Home Access: Level entry     Home Layout: Two level Alternate Level Stairs-Number of Steps: 2 flights ~20 steps Alternate Level Stairs-Rails: Left Bathroom Shower/Tub: Tub/shower unit         Home Equipment: Grab bars - tub/shower;Shower seat          Prior Functioning/Environment Level of Independence: Needs assistance    ADL's / Homemaking Assistance Needed: Aide helps with home management and getting in/out of shower            OT Problem List: Decreased strength;Decreased activity tolerance;Impaired balance (sitting and/or standing);Decreased cognition;Decreased safety awareness;Decreased knowledge of use of DME or AE;Pain      OT Treatment/Interventions: Self-care/ADL training;Therapeutic exercise;Energy conservation;DME and/or AE  instruction;Therapeutic activities;Cognitive remediation/compensation;Patient/family education;Balance training    OT Goals(Current goals can be found in the care plan section) Acute Rehab OT Goals Patient Stated Goal: to get clean OT Goal Formulation: With patient Time For Goal Achievement: 10/14/19 Potential to Achieve Goals: Good  OT Frequency: Min 2X/week   Barriers to D/C: Decreased caregiver support  Only has aide 3 hours/day       Co-evaluation              AM-PAC OT "6 Clicks" Daily Activity     Outcome Measure Help from another person eating meals?: None Help from another person taking care of personal grooming?: A Little Help from another person toileting, which includes using toliet, bedpan, or urinal?: A Little Help from another person bathing (including washing, rinsing, drying)?: A Lot Help from another person to put on and taking off regular upper body clothing?: A Little Help from another person to put on and taking off regular lower body clothing?: A Lot 6 Click Score: 17   End of Session Nurse Communication: Mobility status;Other (comment) (bleeding)  Activity Tolerance: Patient tolerated treatment well Patient left: in bed;with bed alarm set;with call bell/phone within reach  OT Visit Diagnosis: Unsteadiness on feet (R26.81);Muscle weakness (generalized) (M62.81);Other symptoms and signs involving cognitive function;Pain Pain - part of body:  (back)                Time: 2440-1027 OT Time Calculation (min): 31 min Charges:  OT General Charges $OT Visit: 1 Visit OT Evaluation $OT Eval Moderate Complexity: 1 Mod OT Treatments $Self Care/Home Management :  8-22 mins  August Luz, OTR/L   Phylliss Bob 09/30/2019, 9:34 AM

## 2019-09-30 NOTE — Progress Notes (Addendum)
Progress Note    IBRAHEEM VORIS  DSK:[id number] DOB: March 20, 1955  DOA: 09/28/2019 PCP: [name], [name]      Brief Narrative:    Medical records reviewed and are as summarized below:  JEBIDIAH BAGGERLY is a 64 y.o. male  with medical history significant of  BPH with obstruction, prostate cancer on active surveillance, hypertension, hyperlipidemia, diabetes mellitus, GERD presented to emergency department with generalized weakness, nausea and vomiting for about 1 week duration.      Assessment/Plan:   Principal Problem:   Pneumonia due to COVID-19 virus Active Problems:   Dyslipidemia associated with type 2 diabetes mellitus ([place])   Type 2 diabetes mellitus with diabetic nephropathy, without long-term current use of insulin ([facility])   Essential hypertension   Acute on chronic kidney failure ([facility])   Hyponatremia   Sinus tachycardia  COVID-19 infection/pneumonia with acute hypoxemic respiratory failure: Start IV dexamethasone.  Continue IV remdesivir.  Antiemetics as needed for nausea and vomiting.  Oxygen via nasal cannula as needed.  He said he got 1 dose of [place] Covid vaccine 2 weeks prior to admission and was scheduled to have his second dose on October 16, 2019.  AKI on CKD stage IIIa with metabolic acidosis: Bicarbonate level is improving.  Repeat BMP tomorrow.  Prostate cancer, BPH with obstruction, moderate to severe right hydronephrosis, moderate to severe prostatic enlargement with indentation of bladder base: Patient said he is aware of these chronic medical problems when he follows up with a urologist as an outpatient.  Hyponatremia: Improved  No evidence of UTI: Rocephin was discontinued on 09/29/2019.  Body mass index is 28.15 kg/m.  Diet Order            Diet Carb Modified Fluid consistency: Thin; Room service appropriate? Yes  Diet effective now                       Medications:   . vitamin C  500 mg Oral Daily  . aspirin EC  81 mg Oral Daily   . atorvastatin  20 mg Oral Daily  . dexamethasone (DECADRON) injection  6 mg Intravenous Q24H  . enoxaparin (LOVENOX) injection  40 mg Subcutaneous Q24H  . insulin aspart  0-15 Units Subcutaneous TID WC  . insulin aspart  0-5 Units Subcutaneous QHS  . metoprolol tartrate  50 mg Oral Daily  . zinc sulfate  220 mg Oral Daily   Continuous Infusions: . remdesivir 100 mg in NS 100 mL 100 mg (09/30/19 0820)     Anti-infectives (From admission, onward)   Start     Dose/Rate Route Frequency Ordered Stop   09/29/19 1000  remdesivir 100 mg in sodium chloride 0.9 % 100 mL IVPB     Discontinue    "Followed by" Linked Group Details   100 mg 200 mL/hr over 30 Minutes Intravenous Daily 09/28/19 1624 10/03/19 0959   09/28/19 1630  remdesivir 200 mg in sodium chloride 0.9% 250 mL IVPB       "Followed by" Linked Group Details   200 mg 580 mL/hr over 30 Minutes Intravenous Once 09/28/19 1624 09/28/19 1915   09/28/19 1500  cefTRIAXone (ROCEPHIN) 1 g in sodium chloride 0.9 % 100 mL IVPB  Status:  Discontinued        1 g 200 mL/hr over 30 Minutes Intravenous Every 24 hours 09/28/19 1448 09/29/19 1209             Family Communication/Anticipated D/C date  and plan/Code Status   DVT prophylaxis: enoxaparin (LOVENOX) injection 40 mg Start: 09/28/19 1430 SCDs Start: 09/28/19 1417     Code Status: Full Code  Family Communication: Plan discussed with patient Disposition Plan:    Status is: Inpatient  Remains inpatient appropriate because:IV treatments appropriate due to intensity of illness or inability to take PO and Inpatient level of care appropriate due to severity of illness   Dispo: The patient is from: Home              Anticipated d/c is to: Home              Anticipated d/c date is: 3 days              Patient currently is not medically stable to d/c.           Subjective:   He complains of cough, fatigue and generalized weakness.  No shortness of breath, chest pain  or abdominal pain.  Oxygen saturation has been dropping intermittently into the upper 80s, down to 86% on room air especially with mild activity or while he is talking.  Objective:    Vitals:   09/29/19 1537 09/29/19 2047 09/30/19 0409 09/30/19 0800  BP: 136/79 129/87 (!) 145/90   Pulse: 69 70 66   Resp: 17 17 12    Temp: 99.7 F (37.6 C) 99.2 F (37.3 C) 99.5 F (37.5 C) 98 F (36.7 C)  TempSrc: Axillary Oral Oral Oral  SpO2: 97% 95% 98%   Weight:   79.1 kg   Height:   5\' 6"  (1.676 m)    No data found.   Intake/Output Summary (Last 24 hours) at 09/30/2019 1107 Last data filed at 09/30/2019 0411 Gross per 24 hour  Intake 100 ml  Output 550 ml  Net -450 ml   Filed Weights   09/28/19 1214 09/30/19 0409  Weight: 77.1 kg 79.1 kg    Exam:  GEN: NAD SKIN: No rash EYES: EOMI ENT: MMM CV: RRR PULM: Basilar rales. no wheezing heard ABD: soft, obese, NT, +BS CNS: AAO x 3, non focal EXT: No edema or tenderness   Data Reviewed:   I have personally reviewed following labs and imaging studies:  Labs: Labs show the following:   Basic Metabolic Panel: Recent Labs  Lab 09/28/19 1220 09/28/19 1432 09/29/19 0436 09/30/19 0352  NA 132*  --  139  --   K 4.4  --  5.0  --   CL 101  --  106  --   CO2 14*  --  19*  --   GLUCOSE 208*  --  94  --   BUN 59*  --  48*  --   CREATININE 2.12* 2.08* 1.76*  --   CALCIUM 9.2  --  9.0  --   MG  --  2.6* 2.4 2.3  PHOS  --  2.7 2.8 3.0   GFR Estimated Creatinine Clearance: 41.4 mL/min (A) (by C-G formula based on SCr of 1.76 mg/dL (H)). Liver Function Tests: Recent Labs  Lab 09/28/19 1220 09/29/19 0436  AST 19 17  ALT 26 24  ALKPHOS 100 94  BILITOT 1.5* 0.6  PROT 7.9 7.3  ALBUMIN 3.3* 3.1*   Recent Labs  Lab 09/28/19 1432  LIPASE 30   No results for input(s): AMMONIA in the last 168 hours. Coagulation profile Recent Labs  Lab 09/28/19 1220  INR 1.0    CBC: Recent Labs  Lab 09/28/19 1220 09/28/19 1432  09/29/19  0436  WBC 7.5 7.4 6.3  NEUTROABS 6.2  --   --   HGB 17.1* 17.3* 16.4  HCT 52.0 53.0* 50.1  MCV 83.3 83.5 83.6  PLT 208 201 205   Cardiac Enzymes: No results for input(s): CKTOTAL, CKMB, CKMBINDEX, TROPONINI in the last 168 hours. BNP (last 3 results) No results for input(s): PROBNP in the last 8760 hours. CBG: Recent Labs  Lab 09/29/19 0827 09/29/19 1130 09/29/19 1604 09/29/19 2044 09/30/19 0752  GLUCAP 100* 147* 151* 154* 134*   D-Dimer: Recent Labs    09/29/19 0436 09/30/19 0352  DDIMER 1.18* 0.97*   Hgb A1c: No results for input(s): HGBA1C in the last 72 hours. Lipid Profile: No results for input(s): CHOL, HDL, LDLCALC, TRIG, CHOLHDL, LDLDIRECT in the last 72 hours. Thyroid function studies: Recent Labs    09/28/19 1432  TSH 0.146*   Anemia work up: Recent Labs    09/29/19 0436 09/30/19 0352  FERRITIN 254 257   Sepsis Labs: Recent Labs  Lab 09/28/19 1220 09/28/19 1432 09/28/19 1700 09/29/19 0436  PROCALCITON  --   --  0.13  --   WBC 7.5 7.4  --  6.3  LATICACIDVEN  --  1.2  --   --     Microbiology Recent Results (from the past 240 hour(s))  SARS Coronavirus 2 by RT PCR (hospital order, performed in [place] hospital lab) Nasopharyngeal Nasopharyngeal Swab     Status: Abnormal   Collection Time: 09/28/19 12:20 PM   Specimen: Nasopharyngeal Swab  Result Value Ref Range Status   SARS Coronavirus 2 POSITIVE (A) NEGATIVE Final    Comment: RESULT CALLED TO, READ BACK BY AND VERIFIED WITH: RN M. COCHRANE [id number] FCP (NOTE) SARS-CoV-2 target nucleic acids are DETECTED  SARS-CoV-2 RNA is generally detectable in upper respiratory specimens  during the acute phase of infection.  Positive results are indicative  of the presence of the identified virus, but do not rule out bacterial infection or co-infection with other pathogens not detected by the test.  Clinical correlation with patient history and  other diagnostic information is  necessary to determine patient infection status.  The expected result is negative.  Fact Sheet for Patients:   StrictlyIdeas.no   Fact Sheet for Healthcare Providers:   BankingDealers.co.za    This test is not yet approved or cleared by the [place] FDA and  has been authorized for detection and/or diagnosis of SARS-CoV-2 by FDA under an Emergency Use Authorization (EUA).  This EUA will remain in effect (meaning this test c an be used) for the duration of  the COVID-19 declaration under Section 564(b)(1) of the Act, 21 U.S.C. section 360-bbb-3(b)(1), unless the authorization is terminated or revoked sooner.  Performed at [facility], [place] [address], [place], [place] [address]   Culture, Urine     Status: None   Collection Time: 09/28/19  4:25 PM   Specimen: Urine, Catheterized  Result Value Ref Range Status   Specimen Description URINE, CATHETERIZED  Final   Special Requests NONE  Final   Culture   Final    NO GROWTH Performed at [facility], [address] [place], [place] [address]    Report Status 09/30/2019 FINAL  Final    Procedures and diagnostic studies:  CT ABDOMEN PELVIS WO CONTRAST  Result Date: 09/28/2019 CLINICAL DATA:  Flank pain, nausea, acute renal failure EXAM: CT ABDOMEN AND PELVIS WITHOUT CONTRAST TECHNIQUE: Multidetector CT imaging of the abdomen and pelvis  was performed following the standard protocol without IV contrast. COMPARISON:  09/30/2016 FINDINGS: Lower chest: There are asymmetric areas of peripheral ground-glass infiltrate and pulmonary consolidation within the visualized lung bases most in keeping with changes of atypical infection and suggestive of acute COVID-19 pneumonia. No pleural effusion. Moderate to severe coronary artery calcification. Global cardiac size within normal limits. Hepatobiliary: Liver and gallbladder are unremarkable. Pancreas: Unremarkable. Spleen: Unremarkable.  Adrenals/Urinary Tract: The adrenal glands are unremarkable. The kidneys are normal in position. Surgical changes of right psoas hitch are identified. Since the prior examination, there has developed marked asymmetric right renal cortical atrophy. Moderate to severe right hydronephrosis and hydroureter is seen extending to the level of the ureteral anastomosis within the right retroperitoneum. 3 mm nonobstructing calculus is seen within the lower pole of the right kidney. Multiple simple cortical cysts are for identified within the left kidney. No renal or ureteral calculi on the left. No hydronephrosis on the left. The bladder is mildly thick wall suggesting changes of chronic bladder outlet obstruction. Stomach/Bowel: The large and small bowel are unremarkable. Appendix normal. The stomach is unremarkable. No free intraperitoneal gas or fluid. Vascular/Lymphatic: There is no pathologic abdominal or pelvic lymphadenopathy. The abdominal vasculature is age-appropriate on this noncontrast examination. Reproductive: The prostate gland is moderate to severely enlarged with indentation of the base of the bladder. Other: Small bilateral fat containing inguinal hernias are present. The rectum is unremarkable. Musculoskeletal: No acute bone abnormality. IMPRESSION: Bibasilar pulmonary infiltrates most in keeping with atypical infection and most suggestive of acute COVID-19 pneumonia. Surgical changes of right psoas hitch with apparent obstruction at the surgical anastomosis and moderate to severe right hydronephrosis. This is likely long-standing with interval development of a severe right renal cortical atrophy. Moderate to severe prosthetic enlargement with indentation of the bladder base. Chronic bladder outlet obstruction suspected with mild circumferential bladder wall thickening noted. Minimal nonobstructing right nephrolithiasis. Electronically Signed   By: Fidela Salisbury MD   On: 09/28/2019 18:05   CT Head Wo  Contrast  Result Date: 09/28/2019 CLINICAL DATA:  Altered mental status with nausea and vomiting EXAM: CT HEAD WITHOUT CONTRAST TECHNIQUE: Contiguous axial images were obtained from the base of the skull through the vertex without intravenous contrast. COMPARISON:  March 22, 2016 FINDINGS: Brain: Moderate diffuse atrophy is stable. Prominence of the cisterna magna is an anatomic variant. There is no intracranial mass, hemorrhage, extra-axial fluid collection, or midline shift. There is small vessel disease in the centra semiovale bilaterally, stable from prior study. No acute infarct evident. Vascular: No hyperdense vessel. There is calcification in each distal vertebral and carotid siphon region. Skull: Bony calvarium appears intact. Sinuses/Orbits: There is opacification in the left and to a lesser extent right sphenoid sinus regions. There is opacification in multiple ethmoid air cells. There is also mucosal thickening in visualized maxillary antra, more on the left than the right. There is mucosal thickening in the inferior left frontal sinus. Orbits appear symmetric bilaterally. Other: Mastoid air cells are clear. IMPRESSION: Stable atrophy with periventricular small vessel disease. No acute infarct evident. No appreciable mass or hemorrhage. There are foci of arterial vascular calcification. There are multiple foci of paranasal sinus disease. Electronically Signed   By: Lowella Grip III M.D.   On: 09/28/2019 13:02   DG Chest Port 1 View  Result Date: 09/28/2019 CLINICAL DATA:  Weakness EXAM: PORTABLE CHEST 1 VIEW COMPARISON:  2018 FINDINGS: Low lung volumes. Patchy density at the right lung base. Mild chronic interstitial  prominence. No pleural effusion or pneumothorax. Heart size is normal for portable technique. IMPRESSION: Right lung base patchy atelectasis/consolidation. Patchy density was also present on 2018 study therefore scarring is also a consideration. Electronically Signed   By: Macy Mis M.D.   On: 09/28/2019 12:57               LOS: 2 days   Linkin Vizzini  [facility]     09/30/2019, 11:07 AM

## 2019-09-30 NOTE — Progress Notes (Signed)
Patient is resting in bed at this time.  Gave suppository as well as insulin coverage.

## 2019-09-30 NOTE — Progress Notes (Signed)
PT was in room with patient getting him to ambulate to the door.  Patient states that he feels weak and his legs.  Patient complaining of feeling nauseous at this time as well.  Patient sitting on side of bed with tray table in front of him

## 2019-10-01 DIAGNOSIS — J1282 Pneumonia due to Coronavirus disease 2019: Secondary | ICD-10-CM | POA: Diagnosis not present

## 2019-10-01 DIAGNOSIS — R000 Tachycardia, unspecified: Secondary | ICD-10-CM

## 2019-10-01 DIAGNOSIS — U071 COVID-19: Secondary | ICD-10-CM | POA: Diagnosis not present

## 2019-10-01 DIAGNOSIS — I10 Essential (primary) hypertension: Secondary | ICD-10-CM | POA: Diagnosis not present

## 2019-10-01 LAB — GLUCOSE, CAPILLARY
Glucose-Capillary: 273 mg/dL — ABNORMAL HIGH (ref 70–99)
Glucose-Capillary: 198 mg/dL — ABNORMAL HIGH (ref 70–99)
Glucose-Capillary: 202 mg/dL — ABNORMAL HIGH (ref 70–99)
Glucose-Capillary: 194 mg/dL — ABNORMAL HIGH (ref 70–99)

## 2019-10-01 LAB — CBC WITH DIFFERENTIAL/PLATELET
Abs Immature Granulocytes: 0.11 10*3/uL — ABNORMAL HIGH (ref 0.00–0.07)
Basophils Absolute: 0 10*3/uL (ref 0.0–0.1)
Basophils Relative: 0 %
Eosinophils Absolute: 0 10*3/uL (ref 0.0–0.5)
Eosinophils Relative: 0 %
HCT: 56.1 % — ABNORMAL HIGH (ref 39.0–52.0)
Hemoglobin: 18.2 g/dL — ABNORMAL HIGH (ref 13.0–17.0)
Immature Granulocytes: 1 %
Lymphocytes Relative: 7 %
Lymphs Abs: 0.7 10*3/uL (ref 0.7–4.0)
MCH: 27 pg (ref 26.0–34.0)
MCHC: 32.4 g/dL (ref 30.0–36.0)
MCV: 83.4 fL (ref 80.0–100.0)
Monocytes Absolute: 0.6 10*3/uL (ref 0.1–1.0)
Monocytes Relative: 6 %
Neutro Abs: 9.7 10*3/uL — ABNORMAL HIGH (ref 1.7–7.7)
Neutrophils Relative %: 86 %
Platelets: 334 10*3/uL (ref 150–400)
RBC: 6.73 MIL/uL — ABNORMAL HIGH (ref 4.22–5.81)
RDW: 13.6 % (ref 11.5–15.5)
WBC: 11.1 10*3/uL — ABNORMAL HIGH (ref 4.0–10.5)
nRBC: 0 % (ref 0.0–0.2)

## 2019-10-01 LAB — BASIC METABOLIC PANEL
Anion gap: 13 (ref 5–15)
BUN: 46 mg/dL — ABNORMAL HIGH (ref 8–23)
CO2: 14 mmol/L — ABNORMAL LOW (ref 22–32)
Calcium: 9.6 mg/dL (ref 8.9–10.3)
Chloride: 108 mmol/L (ref 98–111)
Creatinine, Ser: 1.81 mg/dL — ABNORMAL HIGH (ref 0.61–1.24)
GFR calc Af Amer: 44 mL/min — ABNORMAL LOW (ref >60)
GFR calc non Af Amer: 38 mL/min — ABNORMAL LOW (ref >60)
Glucose, Bld: 263 mg/dL — ABNORMAL HIGH (ref 70–99)
Potassium: 5.7 mmol/L — ABNORMAL HIGH (ref 3.5–5.1)
Sodium: 135 mmol/L (ref 135–145)

## 2019-10-01 LAB — MAGNESIUM: Magnesium: 2 mg/dL (ref 1.7–2.4)

## 2019-10-01 LAB — PHOSPHORUS: Phosphorus: 3.6 mg/dL (ref 2.5–4.6)

## 2019-10-01 LAB — C-REACTIVE PROTEIN: CRP: 1.3 mg/dL — ABNORMAL HIGH (ref <1.0)

## 2019-10-01 LAB — D-DIMER, QUANTITATIVE: D-Dimer, Quant: 0.92 ug/mL-FEU — ABNORMAL HIGH (ref 0.00–0.50)

## 2019-10-01 LAB — FERRITIN: Ferritin: 285 ng/mL (ref 24–336)

## 2019-10-01 MED ORDER — SODIUM ZIRCONIUM CYCLOSILICATE 10 G PO PACK
10.0000 g | PACK | Freq: Two times a day (BID) | ORAL | Status: AC
  Administered 2019-10-01 (×2): 10 g via ORAL
  Filled 2019-10-01 (×2): qty 1

## 2019-10-01 MED ORDER — SODIUM BICARBONATE 650 MG PO TABS
1300.0000 mg | ORAL_TABLET | Freq: Three times a day (TID) | ORAL | Status: DC
  Administered 2019-10-01 – 2019-10-05 (×12): 1300 mg via ORAL
  Filled 2019-10-01 (×12): qty 2

## 2019-10-01 NOTE — Progress Notes (Signed)
   10/01/19 1955  Assess: MEWS Score  Temp 97.8 F (36.6 C)  BP (!) 113/59  Pulse Rate 70  ECG Heart Rate 70  Resp 20  Level of Consciousness Alert  SpO2 94 %  O2 Device Room Air  Patient Activity (if Appropriate) In bed  Assess: MEWS Score  MEWS Temp 0  MEWS Systolic 0  MEWS Pulse 0  MEWS RR 0  MEWS LOC 0  MEWS Score 0  MEWS Score Color Green  Assess: if the MEWS score is Yellow or Red  Were vital signs taken at a resting state? Yes  Focused Assessment Documented focused assessment  Early Detection of Sepsis Score *See Row Information* Low  MEWS guidelines implemented *See Row Information* No, other (Comment) (no acute changes)  Document  Progress note created (see row info) Yes

## 2019-10-01 NOTE — Plan of Care (Signed)
  Problem: Education: Goal: Ability to describe self-care measures that may prevent or decrease complications (Diabetes Survival Skills Education) will improve Outcome: Progressing Goal: Individualized Educational Video(s) Outcome: Progressing   Problem: Coping: Goal: Ability to adjust to condition or change in health will improve Outcome: Progressing   Problem: Fluid Volume: Goal: Ability to maintain a balanced intake and output will improve Outcome: Progressing   Problem: Health Behavior/Discharge Planning: Goal: Ability to identify and utilize available resources and services will improve Outcome: Progressing Goal: Ability to manage health-related needs will improve Outcome: Progressing   Problem: Metabolic: Goal: Ability to maintain appropriate glucose levels will improve Outcome: Progressing   Problem: Nutritional: Goal: Maintenance of adequate nutrition will improve Outcome: Progressing Goal: Progress toward achieving an optimal weight will improve Outcome: Progressing   Problem: Skin Integrity: Goal: Risk for impaired skin integrity will decrease Outcome: Progressing   Problem: Tissue Perfusion: Goal: Adequacy of tissue perfusion will improve Outcome: Progressing   Problem: Education: Goal: Knowledge of General Education information will improve Description: Including pain rating scale, medication(s)/side effects and non-pharmacologic comfort measures Outcome: Progressing   Problem: Health Behavior/Discharge Planning: Goal: Ability to manage health-related needs will improve Outcome: Progressing   Problem: Clinical Measurements: Goal: Ability to maintain clinical measurements within normal limits will improve Outcome: Progressing Goal: Will remain free from infection Outcome: Progressing Goal: Diagnostic test results will improve Outcome: Progressing Goal: Respiratory complications will improve Outcome: Progressing Goal: Cardiovascular complication will  be avoided Outcome: Progressing   Problem: Activity: Goal: Risk for activity intolerance will decrease Outcome: Progressing   Problem: Nutrition: Goal: Adequate nutrition will be maintained Outcome: Progressing   Problem: Coping: Goal: Level of anxiety will decrease Outcome: Progressing   Problem: Elimination: Goal: Will not experience complications related to bowel motility Outcome: Progressing Goal: Will not experience complications related to urinary retention Outcome: Progressing   Problem: Pain Managment: Goal: General experience of comfort will improve Outcome: Progressing   Problem: Safety: Goal: Ability to remain free from injury will improve Outcome: Progressing   Problem: Skin Integrity: Goal: Risk for impaired skin integrity will decrease Outcome: Progressing   Problem: Education: Goal: Knowledge of risk factors and measures for prevention of condition will improve Outcome: Progressing   Problem: Coping: Goal: Psychosocial and spiritual needs will be supported Outcome: Progressing   Problem: Respiratory: Goal: Will maintain a patent airway Outcome: Progressing Goal: Complications related to the disease process, condition or treatment will be avoided or minimized Outcome: Progressing   

## 2019-10-01 NOTE — Progress Notes (Signed)
Patient is resting in bed in no acute distress.   Patient watching television at this time.

## 2019-10-01 NOTE — Progress Notes (Signed)
Progress Note    ORRIN YURKOVICH  VEL:[id number] DOB: 1955-04-10  DOA: 09/28/2019 PCP: [name], [name]      Brief Narrative:    Medical records reviewed and are as summarized below:  SHALON COUNCILMAN is a 64 y.o. male  with medical history significant of  BPH with obstruction, prostate cancer on active surveillance, hypertension, hyperlipidemia, diabetes mellitus, GERD presented to emergency department with generalized weakness, nausea and vomiting for about 1 week duration.      Assessment/Plan:   Principal Problem:   Pneumonia due to COVID-19 virus Active Problems:   Dyslipidemia associated with type 2 diabetes mellitus ([place])   Type 2 diabetes mellitus with diabetic nephropathy, without long-term current use of insulin (HCC)   Essential hypertension   Acute on chronic kidney failure (HCC)   Hyponatremia   Sinus tachycardia  COVID-19 infection/pneumonia with acute hypoxemic respiratory failure: Continue IV dexamethasone and IV remdesivir.  Antiemetics as needed for nausea and vomiting.  Oxygen via nasal cannula as needed (patient has intermittent hypoxemia with oxygen saturation dropping to 87% on room air).  He said he got 1 dose of [place] Covid vaccine 2 weeks prior to admission and was scheduled to have his second dose on October 16, 2019.  Sinus tachycardia: This is due to underlying infection.  Continue to monitor.  CKD stage IIIb with metabolic acidosis, hyperkalemia: Treat with sodium bicarbonate and Lokelma.  Repeat BMP tomorrow.  Chart review shows  CKD stage 3B rather than AKI.  Outpatient follow-up with nephrologist.  Prostate cancer, BPH with obstruction, moderate to severe right hydronephrosis, moderate to severe prostatic enlargement with indentation of bladder base: Patient said he is aware of these chronic medical problems and he follows up with a urologist as an outpatient.  Hyponatremia: Improved  No evidence of UTI: Rocephin was discontinued on  09/29/2019.  Body mass index is 28.68 kg/m.  Diet Order            Diet Carb Modified Fluid consistency: Thin; Room service appropriate? Yes  Diet effective now                       Medications:   . vitamin C  500 mg Oral Daily  . aspirin EC  81 mg Oral Daily  . atorvastatin  20 mg Oral Daily  . dexamethasone (DECADRON) injection  6 mg Intravenous Q24H  . enoxaparin (LOVENOX) injection  40 mg Subcutaneous Q24H  . hydrocortisone  25 mg Rectal BID  . insulin aspart  0-15 Units Subcutaneous TID WC  . insulin aspart  0-5 Units Subcutaneous QHS  . metoprolol tartrate  50 mg Oral Daily  . sodium bicarbonate  1,300 mg Oral TID  . sodium zirconium cyclosilicate  10 g Oral BID  . zinc sulfate  220 mg Oral Daily   Continuous Infusions: . remdesivir 100 mg in NS 100 mL 100 mg (10/01/19 0917)     Anti-infectives (From admission, onward)   Start     Dose/Rate Route Frequency Ordered Stop   09/29/19 1000  remdesivir 100 mg in sodium chloride 0.9 % 100 mL IVPB     Discontinue    "Followed by" Linked Group Details   100 mg 200 mL/hr over 30 Minutes Intravenous Daily 09/28/19 1624 10/03/19 0959   09/28/19 1630  remdesivir 200 mg in sodium chloride 0.9% 250 mL IVPB       "Followed by" Linked Group Details   200 mg 580  mL/hr over 30 Minutes Intravenous Once 09/28/19 1624 09/28/19 1915   09/28/19 1500  cefTRIAXone (ROCEPHIN) 1 g in sodium chloride 0.9 % 100 mL IVPB  Status:  Discontinued        1 g 200 mL/hr over 30 Minutes Intravenous Every 24 hours 09/28/19 1448 09/29/19 1209             Family Communication/Anticipated D/C date and plan/Code Status   DVT prophylaxis: enoxaparin (LOVENOX) injection 40 mg Start: 09/28/19 1430 SCDs Start: 09/28/19 1417     Code Status: Full Code  Family Communication: Plan discussed with patient Disposition Plan:    Status is: Inpatient  Remains inpatient appropriate because:IV treatments appropriate due to intensity of  illness or inability to take PO and Inpatient level of care appropriate due to severity of illness   Dispo: The patient is from: Home              Anticipated d/c is to: Home              Anticipated d/c date is: 3 days              Patient currently is not medically stable to d/c.           Subjective:   He said he had a rough night.  He had coughing spells overnight and that kept him awake.  He feels congested.  He still feels weak and tired.  Objective:     Vitals:   09/30/19 2110 10/01/19 0513 10/01/19 0536 10/01/19 0545  BP: (!) 141/87 (!) 165/108 (!) 162/111 (!) 166/104  Pulse: 72 90 82 95  Resp: 19 16 15 18   Temp: 98.1 F (36.7 C) 97.9 F (36.6 C)    TempSrc: Oral Oral    SpO2: 92% 94% 96% 96%  Weight:  80.6 kg    Height:       No data found.   Intake/Output Summary (Last 24 hours) at 10/01/2019 1222 Last data filed at 10/01/2019 1012 Gross per 24 hour  Intake 600 ml  Output 475 ml  Net 125 ml   Filed Weights   09/28/19 1214 09/30/19 0409 10/01/19 0513  Weight: 77.1 kg 79.1 kg 80.6 kg    Exam:  GEN: No acute distress SKIN: No rash EYES: EOMI ENT: MMM CV: RRR PULM: Basilar rales. no wheezing heard ABD: soft, obese, NT, +BS CNS: AAO x 3, non focal EXT: No edema or tenderness   Data Reviewed:   I have personally reviewed following labs and imaging studies:  Labs: Labs show the following:   Basic Metabolic Panel: Recent Labs  Lab 09/28/19 1220 09/28/19 1220 09/28/19 1432 09/29/19 0436 09/30/19 0352 10/01/19 0519 10/01/19 0918  NA 132*  --   --  139  --   --  135  K 4.4   < >  --  5.0  --   --  5.7*  CL 101  --   --  106  --   --  108  CO2 14*  --   --  19*  --   --  14*  GLUCOSE 208*  --   --  94  --   --  263*  BUN 59*  --   --  48*  --   --  46*  CREATININE 2.12*  --  2.08* 1.76*  --   --  1.81*  CALCIUM 9.2  --   --  9.0  --   --  9.6  MG  --   --  2.6* 2.4 2.3 2.0  --   PHOS  --   --  2.7 2.8 3.0 3.6  --    < > = values  in this interval not displayed.   GFR Estimated Creatinine Clearance: 40.6 mL/min (A) (by C-G formula based on SCr of 1.81 mg/dL (H)). Liver Function Tests: Recent Labs  Lab 09/28/19 1220 09/29/19 0436  AST 19 17  ALT 26 24  ALKPHOS 100 94  BILITOT 1.5* 0.6  PROT 7.9 7.3  ALBUMIN 3.3* 3.1*   Recent Labs  Lab 09/28/19 1432  LIPASE 30   No results for input(s): AMMONIA in the last 168 hours. Coagulation profile Recent Labs  Lab 09/28/19 1220  INR 1.0    CBC: Recent Labs  Lab 09/28/19 1220 09/28/19 1432 09/29/19 0436 10/01/19 0918  WBC 7.5 7.4 6.3 11.1*  NEUTROABS 6.2  --   --  9.7*  HGB 17.1* 17.3* 16.4 18.2*  HCT 52.0 53.0* 50.1 56.1*  MCV 83.3 83.5 83.6 83.4  PLT 208 201 205 334   Cardiac Enzymes: No results for input(s): CKTOTAL, CKMB, CKMBINDEX, TROPONINI in the last 168 hours. BNP (last 3 results) No results for input(s): PROBNP in the last 8760 hours. CBG: Recent Labs  Lab 09/30/19 1247 09/30/19 1726 09/30/19 2107 10/01/19 0733 10/01/19 1140  GLUCAP 95 176* 220* 273* 198*   D-Dimer: Recent Labs    09/30/19 0352 10/01/19 0519  DDIMER 0.97* 0.92*   Hgb A1c: No results for input(s): HGBA1C in the last 72 hours. Lipid Profile: No results for input(s): CHOL, HDL, LDLCALC, TRIG, CHOLHDL, LDLDIRECT in the last 72 hours. Thyroid function studies: Recent Labs    09/28/19 1432  TSH 0.146*   Anemia work up: Recent Labs    09/30/19 0352 10/01/19 0519  FERRITIN 257 285   Sepsis Labs: Recent Labs  Lab 09/28/19 1220 09/28/19 1432 09/28/19 1700 09/29/19 0436 10/01/19 0918  PROCALCITON  --   --  0.13  --   --   WBC 7.5 7.4  --  6.3 11.1*  LATICACIDVEN  --  1.2  --   --   --     Microbiology Recent Results (from the past 240 hour(s))  SARS Coronavirus 2 by RT PCR (hospital order, performed in [facility] hospital lab) Nasopharyngeal Nasopharyngeal Swab     Status: Abnormal   Collection Time: 09/28/19 12:20 PM   Specimen:  Nasopharyngeal Swab  Result Value Ref Range Status   SARS Coronavirus 2 POSITIVE (A) NEGATIVE Final    Comment: RESULT CALLED TO, READ BACK BY AND VERIFIED WITH: RN M. COCHRANE [id number] FCP (NOTE) SARS-CoV-2 target nucleic acids are DETECTED  SARS-CoV-2 RNA is generally detectable in upper respiratory specimens  during the acute phase of infection.  Positive results are indicative  of the presence of the identified virus, but do not rule out bacterial infection or co-infection with other pathogens not detected by the test.  Clinical correlation with patient history and  other diagnostic information is necessary to determine patient infection status.  The expected result is negative.  Fact Sheet for Patients:   StrictlyIdeas.no   Fact Sheet for Healthcare Providers:   BankingDealers.co.za    This test is not yet approved or cleared by the [place] FDA and  has been authorized for detection and/or diagnosis of SARS-CoV-2 by FDA under an Emergency Use Authorization (EUA).  This EUA will remain in effect (meaning this test c an be used)  for the duration of  the COVID-19 declaration under Section 564(b)(1) of the Act, 21 U.S.C. section 360-bbb-3(b)(1), unless the authorization is terminated or revoked sooner.  Performed at [facility], [place] [address], [place], [place] [address]   Culture, Urine     Status: None   Collection Time: 09/28/19  4:25 PM   Specimen: Urine, Catheterized  Result Value Ref Range Status   Specimen Description URINE, CATHETERIZED  Final   Special Requests NONE  Final   Culture   Final    NO GROWTH Performed at [facility], [address], [place], [place] [address]    Report Status 09/30/2019 FINAL  Final    Procedures and diagnostic studies:  No results found.             LOS: 3 days   Herman Fiero  [facility]     10/01/2019, 12:22 PM

## 2019-10-02 ENCOUNTER — Inpatient Hospital Stay (HOSPITAL_COMMUNITY): Payer: Medicare Other

## 2019-10-02 DIAGNOSIS — J1282 Pneumonia due to coronavirus disease 2019: Secondary | ICD-10-CM

## 2019-10-02 DIAGNOSIS — U071 COVID-19: Principal | ICD-10-CM

## 2019-10-02 LAB — CBC
HCT: 53.3 % — ABNORMAL HIGH (ref 39.0–52.0)
Hemoglobin: 17.2 g/dL — ABNORMAL HIGH (ref 13.0–17.0)
MCH: 27.2 pg (ref 26.0–34.0)
MCHC: 32.3 g/dL (ref 30.0–36.0)
MCV: 84.2 fL (ref 80.0–100.0)
Platelets: 287 10*3/uL (ref 150–400)
RBC: 6.33 MIL/uL — ABNORMAL HIGH (ref 4.22–5.81)
RDW: 13.6 % (ref 11.5–15.5)
WBC: 11 10*3/uL — ABNORMAL HIGH (ref 4.0–10.5)
nRBC: 0 % (ref 0.0–0.2)

## 2019-10-02 LAB — URINALYSIS, ROUTINE W REFLEX MICROSCOPIC
Bacteria, UA: NONE SEEN
Bilirubin Urine: NEGATIVE
Glucose, UA: >=500 mg/dL — AB
Hgb urine dipstick: NEGATIVE
Ketones, ur: NEGATIVE mg/dL
Leukocytes,Ua: NEGATIVE
Nitrite: NEGATIVE
Protein, ur: 30 mg/dL — AB
Specific Gravity, Urine: 1.02 (ref 1.005–1.030)
pH: 5 (ref 5.0–8.0)

## 2019-10-02 LAB — D-DIMER, QUANTITATIVE: D-Dimer, Quant: 1.08 ug/mL-FEU — ABNORMAL HIGH (ref 0.00–0.50)

## 2019-10-02 LAB — COMPREHENSIVE METABOLIC PANEL
ALT: 90 U/L — ABNORMAL HIGH (ref 0–44)
AST: 39 U/L (ref 15–41)
Albumin: 3.2 g/dL — ABNORMAL LOW (ref 3.5–5.0)
Alkaline Phosphatase: 97 U/L (ref 38–126)
Anion gap: 11 (ref 5–15)
BUN: 50 mg/dL — ABNORMAL HIGH (ref 8–23)
CO2: 19 mmol/L — ABNORMAL LOW (ref 22–32)
Calcium: 9.7 mg/dL (ref 8.9–10.3)
Chloride: 109 mmol/L (ref 98–111)
Creatinine, Ser: 1.71 mg/dL — ABNORMAL HIGH (ref 0.61–1.24)
GFR calc Af Amer: 48 mL/min — ABNORMAL LOW (ref >60)
GFR calc non Af Amer: 41 mL/min — ABNORMAL LOW (ref >60)
Glucose, Bld: 222 mg/dL — ABNORMAL HIGH (ref 70–99)
Potassium: 5.1 mmol/L (ref 3.5–5.1)
Sodium: 139 mmol/L (ref 135–145)
Total Bilirubin: 0.6 mg/dL (ref 0.3–1.2)
Total Protein: 7.1 g/dL (ref 6.5–8.1)

## 2019-10-02 LAB — GLUCOSE, CAPILLARY
Glucose-Capillary: 163 mg/dL — ABNORMAL HIGH (ref 70–99)
Glucose-Capillary: 227 mg/dL — ABNORMAL HIGH (ref 70–99)
Glucose-Capillary: 241 mg/dL — ABNORMAL HIGH (ref 70–99)
Glucose-Capillary: 244 mg/dL — ABNORMAL HIGH (ref 70–99)

## 2019-10-02 LAB — LIPASE, BLOOD: Lipase: 41 U/L (ref 11–51)

## 2019-10-02 LAB — PROTIME-INR
INR: 1.1 (ref 0.8–1.2)
Prothrombin Time: 14.2 seconds (ref 11.4–15.2)

## 2019-10-02 LAB — TYPE AND SCREEN
ABO/RH(D): O POS
Antibody Screen: NEGATIVE

## 2019-10-02 LAB — BASIC METABOLIC PANEL
Anion gap: 13 (ref 5–15)
BUN: 50 mg/dL — ABNORMAL HIGH (ref 8–23)
CO2: 19 mmol/L — ABNORMAL LOW (ref 22–32)
Calcium: 9.9 mg/dL (ref 8.9–10.3)
Chloride: 106 mmol/L (ref 98–111)
Creatinine, Ser: 1.82 mg/dL — ABNORMAL HIGH (ref 0.61–1.24)
GFR calc Af Amer: 44 mL/min — ABNORMAL LOW (ref >60)
GFR calc non Af Amer: 38 mL/min — ABNORMAL LOW (ref >60)
Glucose, Bld: 155 mg/dL — ABNORMAL HIGH (ref 70–99)
Potassium: 4.7 mmol/L (ref 3.5–5.1)
Sodium: 138 mmol/L (ref 135–145)

## 2019-10-02 LAB — PROCALCITONIN: Procalcitonin: <0.1 ng/mL

## 2019-10-02 LAB — C-REACTIVE PROTEIN: CRP: 0.7 mg/dL (ref <1.0)

## 2019-10-02 LAB — LACTIC ACID, PLASMA: Lactic Acid, Venous: 1.1 mmol/L (ref 0.5–1.9)

## 2019-10-02 LAB — BRAIN NATRIURETIC PEPTIDE: B Natriuretic Peptide: 58.9 pg/mL (ref 0.0–100.0)

## 2019-10-02 LAB — MAGNESIUM: Magnesium: 1.9 mg/dL (ref 1.7–2.4)

## 2019-10-02 MED ORDER — PANTOPRAZOLE SODIUM 40 MG IV SOLR
40.0000 mg | Freq: Two times a day (BID) | INTRAVENOUS | Status: DC
  Administered 2019-10-02 – 2019-10-05 (×6): 40 mg via INTRAVENOUS
  Filled 2019-10-02 (×6): qty 40

## 2019-10-02 MED ORDER — OXYCODONE-ACETAMINOPHEN 5-325 MG PO TABS
1.0000 | ORAL_TABLET | ORAL | Status: DC | PRN
  Administered 2019-10-04 – 2019-10-05 (×5): 1 via ORAL
  Filled 2019-10-02 (×5): qty 1

## 2019-10-02 MED ORDER — POLYETHYLENE GLYCOL 3350 17 G PO PACK
17.0000 g | PACK | Freq: Two times a day (BID) | ORAL | Status: DC
  Administered 2019-10-02 – 2019-10-04 (×5): 17 g via ORAL
  Filled 2019-10-02 (×5): qty 1

## 2019-10-02 MED ORDER — HYDROMORPHONE HCL 1 MG/ML IJ SOLN: 1.0000 mg | INTRAMUSCULAR | Status: DC | PRN

## 2019-10-02 MED ORDER — BISACODYL 10 MG RE SUPP
10.0000 mg | Freq: Every day | RECTAL | Status: DC
  Administered 2019-10-03 – 2019-10-04 (×3): 10 mg via RECTAL
  Filled 2019-10-02 (×3): qty 1

## 2019-10-02 MED ORDER — LACTATED RINGERS IV SOLN: INTRAVENOUS | Status: DC

## 2019-10-02 MED ORDER — ALUM & MAG HYDROXIDE-SIMETH 200-200-20 MG/5ML PO SUSP
30.0000 mL | Freq: Three times a day (TID) | ORAL | Status: AC
  Administered 2019-10-02 – 2019-10-03 (×3): 30 mL via ORAL
  Filled 2019-10-02 (×3): qty 30

## 2019-10-02 MED ORDER — MORPHINE SULFATE (PF) 2 MG/ML IV SOLN: 2.0000 mg | INTRAVENOUS | Status: DC | PRN

## 2019-10-02 MED ORDER — SODIUM CHLORIDE 0.9 % IV SOLN
1.0000 g | INTRAVENOUS | Status: DC
  Administered 2019-10-02 – 2019-10-04 (×3): 1 g via INTRAVENOUS
  Filled 2019-10-02 (×3): qty 10

## 2019-10-02 MED ORDER — DOCUSATE SODIUM 100 MG PO CAPS
200.0000 mg | ORAL_CAPSULE | Freq: Two times a day (BID) | ORAL | Status: DC
  Administered 2019-10-02 – 2019-10-04 (×5): 200 mg via ORAL
  Filled 2019-10-02 (×5): qty 2

## 2019-10-02 MED ORDER — DEXAMETHASONE SODIUM PHOSPHATE 4 MG/ML IJ SOLN
4.0000 mg | INTRAMUSCULAR | Status: DC
  Administered 2019-10-02: 4 mg via INTRAVENOUS
  Filled 2019-10-02: qty 1

## 2019-10-02 NOTE — Progress Notes (Signed)
Transport taking patient to CT

## 2019-10-02 NOTE — Progress Notes (Addendum)
Progress Note    [name]  ERD:[id number] DOB: 1955/09/20  DOA: 09/28/2019 PCP: [name], [name]      Brief Narrative:    Medical records reviewed and are as summarized below:  [name] is a 64 y.o. male  with medical history significant of  BPH with obstruction, prostate cancer on active surveillance, hypertension, hyperlipidemia, diabetes mellitus, GERD presented to emergency department with generalized weakness, nausea and vomiting for about 1 week duration.   Assessment/Plan:   COVID-19 infection/pneumonia with acute hypoxemic respiratory failure: He has mild to moderate disease, got 1 dose of the Modern[place] than a vaccine few weeks ago, he has been started on steroids and remdesivir which will be continued and finish the course.  Advance activity.  Encouraged to sit up in chair use I-S and flutter valve for pulmonary toiletry.  Monitor inflammatory markers if stable likely discharge on 10/03/2019.    Recent Labs  Lab 09/28/19 1220 09/28/19 1432 09/28/19 1700 09/29/19 0436 09/30/19 0352 10/01/19 0519 10/02/19 0831  CRP  --   --  4.3* 3.9* 2.5* 1.3* 0.7  DDIMER  --   --  0.89* 1.18* 0.97* 0.92* 1.08*  BNP  --   --  31.6  --   --   --  58.9  PROCALCITON  --   --  0.13  --   --   --   --   LATICACIDVEN  --  1.2  --   --   --   --   --   SARSCOV2NAA POSITIVE*  --   --   --   --   --   --        Sinus tachycardia: This is due to underlying infection.  Continue to monitor.  CKD stage IIIb with metabolic acidosis, hyperkalemia: No function now close to baseline creatinine of around 1.9-2, hyperkalemia has resolved outpatient follow-up with PCP and urology.  Prostate cancer, BPH with obstruction, moderate to severe right hydronephrosis, moderate to severe prostatic enlargement with indentation of bladder base: Patient said he is aware of these chronic medical problems and he follows up with a urologist as an outpatient.   No evidence of UTI: Rocephin was  discontinued on 09/29/2019.   Addendum  4.45 pm - has developed R. Sided Abd pain over the last hour, bladder scan stable, some tenderness as well, repeat CT stat, Urology may need to see, have paged on call.  CT reviewed with Radiology and Dr Alyson Ingles [facility] - Urology will see, GI Rx also ordered, may need IR for Neph.tube, will have Urology opine.    Body mass index is 28.68 kg/m.  Diet Order            Diet Carb Modified Fluid consistency: Thin; Room service appropriate? Yes  Diet effective now                  Medications:   . vitamin C  500 mg Oral Daily  . aspirin EC  81 mg Oral Daily  . atorvastatin  20 mg Oral Daily  . dexamethasone (DECADRON) injection  4 mg Intravenous Q24H  . enoxaparin (LOVENOX) injection  40 mg Subcutaneous Q24H  . hydrocortisone  25 mg Rectal BID  . insulin aspart  0-15 Units Subcutaneous TID WC  . insulin aspart  0-5 Units Subcutaneous QHS  . metoprolol tartrate  50 mg Oral Daily  . sodium bicarbonate  1,300 mg Oral TID  . zinc sulfate  220  mg Oral Daily   Continuous Infusions:    Anti-infectives (From admission, onward)   Start     Dose/Rate Route Frequency Ordered Stop   09/29/19 1000  remdesivir 100 mg in sodium chloride 0.9 % 100 mL IVPB       "Followed by" Linked Group Details   100 mg 200 mL/hr over 30 Minutes Intravenous Daily 09/28/19 1624 10/02/19 1120   09/28/19 1630  remdesivir 200 mg in sodium chloride 0.9% 250 mL IVPB       "Followed by" Linked Group Details   200 mg 580 mL/hr over 30 Minutes Intravenous Once 09/28/19 1624 09/28/19 1915   09/28/19 1500  cefTRIAXone (ROCEPHIN) 1 g in sodium chloride 0.9 % 100 mL IVPB  Status:  Discontinued        1 g 200 mL/hr over 30 Minutes Intravenous Every 24 hours 09/28/19 1448 09/29/19 1209       Family Communication/Anticipated D/C date and plan/Code Status   DVT prophylaxis: enoxaparin (LOVENOX) injection 40 mg Start: 09/28/19 1430 SCDs Start: 09/28/19 1417     Code  Status: Full Code  Family Communication: Plan discussed with patient Disposition Plan:    Status is: Inpatient  Remains inpatient appropriate because:IV treatments appropriate due to intensity of illness or inability to take PO and Inpatient level of care appropriate due to severity of illness   Dispo: The patient is from: Home              Anticipated d/c is to: Home              Anticipated d/c date is: 3 days              Patient currently is not medically stable to d/c.   Subjective:   Patient in bed, appears comfortable, denies any headache, no fever, no chest pain or pressure, no shortness of breath , no abdominal pain. No focal weakness.   Objective:     Vitals:   10/01/19 0545 10/01/19 1323 10/01/19 1955 10/02/19 0513  BP: (!) 166/104 99/62 (!) 113/59 129/78  Pulse: 95 77 70 64  Resp: 18 18 20 18   Temp:  98.2 F (36.8 C) 97.8 F (36.6 C) 97.9 F (36.6 C)  TempSrc:  Oral Oral Oral  SpO2: 96% 92% 94% 94%  Weight:      Height:       No data found.   Intake/Output Summary (Last 24 hours) at 10/02/2019 1204 Last data filed at 10/02/2019 1100 Gross per 24 hour  Intake 720 ml  Output 875 ml  Net -155 ml   Filed Weights   09/28/19 1214 09/30/19 0409 10/01/19 0513  Weight: 77.1 kg 79.1 kg 80.6 kg    Exam:  Awake Alert, No new F.N deficits, Normal affect [place].AT,PERRAL Supple Neck,No JVD, No cervical lymphadenopathy appriciated.  Symmetrical Chest wall movement, Good air movement bilaterally, CTAB RRR,No Gallops, Rubs or new Murmurs, No Parasternal Heave +ve B.Sounds, Abd Soft, No tenderness, No organomegaly appriciated, No rebound - guarding or rigidity. No Cyanosis, Clubbing or edema, No new Rash or bruise    Data Reviewed:   I have personally reviewed following labs and imaging studies:  Labs: Labs show the following:   Basic Metabolic Panel: Recent Labs  Lab 09/28/19 1220 09/28/19 1220 09/28/19 1432 09/29/19 0436 09/29/19 0436  09/30/19 0352 10/01/19 0519 10/01/19 0918 10/02/19 0831  NA 132*  --   --  139  --   --   --  135 138  K 4.4   < >  --  5.0   < >  --   --  5.7* 4.7  CL 101  --   --  106  --   --   --  108 106  CO2 14*  --   --  19*  --   --   --  14* 19*  GLUCOSE 208*  --   --  94  --   --   --  263* 155*  BUN 59*  --   --  48*  --   --   --  46* 50*  CREATININE 2.12*  --  2.08* 1.76*  --   --   --  1.81* 1.82*  CALCIUM 9.2  --   --  9.0  --   --   --  9.6 9.9  MG  --   --  2.6* 2.4  --  2.3 2.0  --  1.9  PHOS  --   --  2.7 2.8  --  3.0 3.6  --   --    < > = values in this interval not displayed.   GFR Estimated Creatinine Clearance: 40.4 mL/min (A) (by C-G formula based on SCr of 1.82 mg/dL (H)). Liver Function Tests: Recent Labs  Lab 09/28/19 1220 09/29/19 0436  AST 19 17  ALT 26 24  ALKPHOS 100 94  BILITOT 1.5* 0.6  PROT 7.9 7.3  ALBUMIN 3.3* 3.1*   Recent Labs  Lab 09/28/19 1432  LIPASE 30   No results for input(s): AMMONIA in the last 168 hours. Coagulation profile Recent Labs  Lab 09/28/19 1220  INR 1.0    CBC: Recent Labs  Lab 09/28/19 1220 09/28/19 1432 09/29/19 0436 10/01/19 0918  WBC 7.5 7.4 6.3 11.1*  NEUTROABS 6.2  --   --  9.7*  HGB 17.1* 17.3* 16.4 18.2*  HCT 52.0 53.0* 50.1 56.1*  MCV 83.3 83.5 83.6 83.4  PLT 208 201 205 334   Cardiac Enzymes: No results for input(s): CKTOTAL, CKMB, CKMBINDEX, TROPONINI in the last 168 hours. BNP (last 3 results) No results for input(s): PROBNP in the last 8760 hours. CBG: Recent Labs  Lab 10/01/19 1140 10/01/19 1616 10/01/19 2115 10/02/19 0727 10/02/19 1144  GLUCAP 198* 202* 194* 163* 227*   D-Dimer: Recent Labs    10/01/19 0519 10/02/19 0831  DDIMER 0.92* 1.08*   Hgb A1c: No results for input(s): HGBA1C in the last 72 hours. Lipid Profile: No results for input(s): CHOL, HDL, LDLCALC, TRIG, CHOLHDL, LDLDIRECT in the last 72 hours. Thyroid function studies: No results for input(s): TSH, T4TOTAL,  T3FREE, THYROIDAB in the last 72 hours.  Invalid input(s): FREET3 Anemia work up: Recent Labs    09/30/19 0352 10/01/19 0519  FERRITIN 257 285   Sepsis Labs: Recent Labs  Lab 09/28/19 1220 09/28/19 1432 09/28/19 1700 09/29/19 0436 10/01/19 0918  PROCALCITON  --   --  0.13  --   --   WBC 7.5 7.4  --  6.3 11.1*  LATICACIDVEN  --  1.2  --   --   --     Microbiology Recent Results (from the past 240 hour(s))  SARS Coronavirus 2 by RT PCR (hospital order, performed in [facility] hospital lab) Nasopharyngeal Nasopharyngeal Swab     Status: Abnormal   Collection Time: 09/28/19 12:20 PM   Specimen: Nasopharyngeal Swab  Result Value Ref Range Status   SARS Coronavirus 2 POSITIVE (A) NEGATIVE Final  Comment: RESULT CALLED TO, READ BACK BY AND VERIFIED WITH: RN M. COCHRANE [id number] FCP (NOTE) SARS-CoV-2 target nucleic acids are DETECTED  SARS-CoV-2 RNA is generally detectable in upper respiratory specimens  during the acute phase of infection.  Positive results are indicative  of the presence of the identified virus, but do not rule out bacterial infection or co-infection with other pathogens not detected by the test.  Clinical correlation with patient history and  other diagnostic information is necessary to determine patient infection status.  The expected result is negative.  Fact Sheet for Patients:   StrictlyIdeas.no   Fact Sheet for Healthcare Providers:   BankingDealers.co.za    This test is not yet approved or cleared by the [place] FDA and  has been authorized for detection and/or diagnosis of SARS-CoV-2 by FDA under an Emergency Use Authorization (EUA).  This EUA will remain in effect (meaning this test c an be used) for the duration of  the COVID-19 declaration under Section 564(b)(1) of the Act, 21 U.S.C. section 360-bbb-3(b)(1), unless the authorization is terminated or revoked sooner.  Performed at  [facility], [place] [address], [place], [place] [address]   Culture, Urine     Status: None   Collection Time: 09/28/19  4:25 PM   Specimen: Urine, Catheterized  Result Value Ref Range Status   Specimen Description URINE, CATHETERIZED  Final   Special Requests NONE  Final   Culture   Final    NO GROWTH Performed at [facility], [address] [place], [place] [address]    Report Status 09/30/2019 FINAL  Final    Procedures and diagnostic studies:  Signature  Lala Lund M.D on 10/02/2019 at 12:04 PM   -  To page go to [email or web address]     '

## 2019-10-02 NOTE — Consult Note (Signed)
Urology Consult  Referring physician: Dr. Candiss Norse Reason for referral: Right hydronephrosis and right flank pain  Chief Complaint: right flank pain  History of Present Illness: [name] is a 64yo with a hx of DMII, Nephrolithiasis admitted with COVID on 7/13. At the time of admission he was having mild right flank pain. The pain resolved until this afternoon when the right flank pain became worse. He has a hx of a right ureteral reimplant with psoas hitch for a right ureteral avulsion in 2018. He has had decline of his right renal function and CT on admission showed right cortical atrophy with moderate right hydroureteronephrosis to the ureteral anastomosis. He underwent renogram in 07/2019 which showed 12% function of his right kidney. Currently he has sharp, intermittent, moderate nonraditing right flank pain with no other associated symptoms. No exacerbating events. Pain improves with narcotics. UA was not concerning for infection. No fevers  Past Medical History:  Diagnosis Date  . Closed nondisplaced spiral fracture of shaft of right tibia   . Complicated urinary tract infection 09/06/2016  . Depression   . Diabetes mellitus without complication ([place])   . GERD (gastroesophageal reflux disease)   . Gout    right foot  . Hemorrhoids   . Hypertension   . Hypertension   . Macular degeneration   . Microcytic anemia   . Prostate cancer ([place]) 08/2009  . Rectal bleeding 07/31/2016  . Tubular adenoma    Past Surgical History:  Procedure Laterality Date  . CYSTOSCOPY WITH RETROGRADE PYELOGRAM, URETEROSCOPY AND STENT PLACEMENT Right 07/31/2016   Procedure: CYSTOSCOPY WITH RIGHT  RETROGRADE PYELOGRAM, URETEROSCOPY;  Surgeon: [name], MD;  Location: [facility];  Service: Urology;  Laterality: Right;  . INGUINAL HERNIA REPAIR    . Left ankle joint fusion  1981  . PROSTATE BIOPSY     x 2  . URETERAL REIMPLANTION  07/31/2016   Procedure: URETERAL REIMPLANT, right boari flap, right psoas  hitch;  Surgeon: [name], MD;  Location: [facility];  Service: Urology;;    Medications: I have reviewed the patient's current medications. Allergies: No Known Allergies  Family History  Problem Relation Age of Onset  . Lung cancer Mother        Deceased  . Throat cancer Brother   . Pancreatic cancer Father   . Heart disease Father        Deceased  . Lung cancer Maternal Uncle        nephew  . Diabetes Other    Social History:  reports that he has never smoked. He has never used smokeless tobacco. He reports current alcohol use. He reports that he does not use drugs.  Review of Systems  Respiratory: Positive for cough and shortness of breath.   Genitourinary: Positive for flank pain.  All other systems reviewed and are negative.   Physical Exam:  Vital signs in last 24 hours: Temp:  [97.9 F (36.6 C)-98.7 F (37.1 C)] 97.9 F (36.6 C) (07/17 1926) Pulse Rate:  [64-74] 70 (07/17 1926) Resp:  [16-19] 19 (07/17 1926) BP: (129-157)/(73-93) 145/93 (07/17 1926) SpO2:  [90 %-100 %] 90 % (07/17 1926) Physical Exam Vitals reviewed.  Constitutional:      Appearance: Normal appearance.  HENT:     Head: Normocephalic and atraumatic.     Nose: Nose normal.  Eyes:     Extraocular Movements: Extraocular movements intact.     Pupils: Pupils are equal, round, and reactive to light.  Cardiovascular:  Rate and Rhythm: Normal rate and regular rhythm.  Pulmonary:     Effort: Pulmonary effort is normal. No respiratory distress.  Abdominal:     General: Abdomen is flat. There is no distension.  Musculoskeletal:        General: No swelling. Normal range of motion.     Cervical back: Normal range of motion and neck supple.  Skin:    General: Skin is warm and dry.  Neurological:     General: No focal deficit present.     Mental Status: He is alert and oriented to person, place, and time.  Psychiatric:        Mood and Affect: Mood normal.        Behavior: Behavior  normal.        Thought Content: Thought content normal.        Judgment: Judgment normal.     Laboratory Data:  Results for orders placed or performed during the hospital encounter of 09/28/19 (from the past 72 hour(s))  C-reactive protein     Status: Abnormal   Collection Time: 09/30/19  3:52 AM  Result Value Ref Range   CRP 2.5 (H) <1.0 mg/dL    Comment: Performed at [facility], [place] [address] [place], [place] [address]  D-dimer, quantitative (not at [facility])     Status: Abnormal   Collection Time: 09/30/19  3:52 AM  Result Value Ref Range   D-Dimer, Quant 0.97 (H) 0.00 - 0.50 ug/mL-FEU    Comment: (NOTE) At the manufacturer cut-off of 0.50 ug/mL FEU, this assay has been documented to exclude PE with a sensitivity and negative predictive value of 97 to 99%.  At this time, this assay has not been approved by the FDA to exclude DVT/VTE. Results should be correlated with clinical presentation. Performed at [facility], [place] [address], [place], [place] [address]   Ferritin     Status: None   Collection Time: 09/30/19  3:52 AM  Result Value Ref Range   Ferritin 257 24 - 336 ng/mL    Comment: Performed at [facility], [place] [address] [place], [place] [address]  Magnesium     Status: None   Collection Time: 09/30/19  3:52 AM  Result Value Ref Range   Magnesium 2.3 1.7 - 2.4 mg/dL    Comment: Performed at [place] [address], [place], [place] [address]  Phosphorus     Status: None   Collection Time: 09/30/19  3:52 AM  Result Value Ref Range   Phosphorus 3.0 2.5 - 4.6 mg/dL    Comment: Performed at [place] [address] [place], [place] [address]  Glucose, capillary     Status: Abnormal   Collection Time: 09/30/19  7:52 AM  Result Value Ref Range   Glucose-Capillary 134 (H) 70 - 99 mg/dL    Comment: Glucose reference range applies only to samples taken after fasting for at least 8 hours.  Glucose, capillary     Status: None    Collection Time: 09/30/19 12:47 PM  Result Value Ref Range   Glucose-Capillary 95 70 - 99 mg/dL    Comment: Glucose reference range applies only to samples taken after fasting for at least 8 hours.  Glucose, capillary     Status: Abnormal   Collection Time: 09/30/19  5:26 PM  Result Value Ref Range   Glucose-Capillary 176 (H) 70 - 99 mg/dL    Comment: Glucose reference range applies  only to samples taken after fasting for at least 8 hours.  Glucose, capillary     Status: Abnormal   Collection Time: 09/30/19  9:07 PM  Result Value Ref Range   Glucose-Capillary 220 (H) 70 - 99 mg/dL    Comment: Glucose reference range applies only to samples taken after fasting for at least 8 hours.  C-reactive protein     Status: Abnormal   Collection Time: 10/01/19  5:19 AM  Result Value Ref Range   CRP 1.3 (H) <1.0 mg/dL    Comment: Performed at [place] [address] [place], [place] [address]  D-dimer, quantitative (not at [facility])     Status: Abnormal   Collection Time: 10/01/19  5:19 AM  Result Value Ref Range   D-Dimer, Quant 0.92 (H) 0.00 - 0.50 ug/mL-FEU    Comment: (NOTE) At the manufacturer cut-off of 0.50 ug/mL FEU, this assay has been documented to exclude PE with a sensitivity and negative predictive value of 97 to 99%.  At this time, this assay has not been approved by the FDA to exclude DVT/VTE. Results should be correlated with clinical presentation. Performed at [facility], [place] [address], [place], [place] [address]   Ferritin     Status: None   Collection Time: 10/01/19  5:19 AM  Result Value Ref Range   Ferritin 285 24 - 336 ng/mL    Comment: Performed at [facility], [place] [address] [place] [address] [place], [place] [address]  Magnesium     Status: None   Collection Time: 10/01/19  5:19 AM  Result Value Ref Range   Magnesium 2.0 1.7 - 2.4 mg/dL    Comment: Performed at [place] [address] [place], [place] [address]  Phosphorus     Status: None    Collection Time: 10/01/19  5:19 AM  Result Value Ref Range   Phosphorus 3.6 2.5 - 4.6 mg/dL    Comment: Performed at [facility] [address] [place], [place] [address]  Glucose, capillary     Status: Abnormal   Collection Time: 10/01/19  7:33 AM  Result Value Ref Range   Glucose-Capillary 273 (H) 70 - 99 mg/dL    Comment: Glucose reference range applies only to samples taken after fasting for at least 8 hours.  Basic metabolic panel     Status: Abnormal   Collection Time: 10/01/19  9:18 AM  Result Value Ref Range   Sodium 135 135 - 145 mmol/L   Potassium 5.7 (H) 3.5 - 5.1 mmol/L   Chloride 108 98 - 111 mmol/L   CO2 14 (L) 22 - 32 mmol/L   Glucose, Bld 263 (H) 70 - 99 mg/dL    Comment: Glucose reference range applies only to samples taken after fasting for at least 8 hours.   BUN 46 (H) 8 - 23 mg/dL   Creatinine, Ser 1.81 (H) 0.61 - 1.24 mg/dL   Calcium 9.6 8.9 - 10.3 mg/dL   GFR calc non Af Amer 38 (L) >60 mL/min   GFR calc Af Amer 44 (L) >60 mL/min   Anion gap 13 5 - 15    Comment: Performed at [facility] [address] [place],  [address]  CBC with Differential/Platelet     Status: Abnormal   Collection Time: 10/01/19  9:18 AM  Result Value Ref Range   WBC 11.1 (H) 4.0 - 10.5 K/uL   RBC 6.73 (H) 4.22 - 5.81 MIL/uL  Hemoglobin 18.2 (H) 13.0 - 17.0 g/dL   HCT 56.1 (H) 39 - 52 %   MCV 83.4 80.0 - 100.0 fL   MCH 27.0 26.0 - 34.0 pg   MCHC 32.4 30.0 - 36.0 g/dL   RDW 13.6 11.5 - 15.5 %   Platelets 334 150 - 400 K/uL   nRBC 0.0 0.0 - 0.2 %   Neutrophils Relative % 86 %   Neutro Abs 9.7 (H) 1.7 - 7.7 K/uL   Lymphocytes Relative 7 %   Lymphs Abs 0.7 0.7 - 4.0 K/uL   Monocytes Relative 6 %   Monocytes Absolute 0.6 0 - 1 K/uL   Eosinophils Relative 0 %   Eosinophils Absolute 0.0 0 - 0 K/uL   Basophils Relative 0 %   Basophils Absolute 0.0 0 - 0 K/uL   Immature Granulocytes 1 %   Abs Immature Granulocytes 0.11 (H) 0.00 - 0.07 K/uL    Comment:  Performed at [facility] [address] [place], [place] [address]  Glucose, capillary     Status: Abnormal   Collection Time: 10/01/19 11:40 AM  Result Value Ref Range   Glucose-Capillary 198 (H) 70 - 99 mg/dL    Comment: Glucose reference range applies only to samples taken after fasting for at least 8 hours.  Glucose, capillary     Status: Abnormal   Collection Time: 10/01/19  4:16 PM  Result Value Ref Range   Glucose-Capillary 202 (H) 70 - 99 mg/dL    Comment: Glucose reference range applies only to samples taken after fasting for at least 8 hours.  Glucose, capillary     Status: Abnormal   Collection Time: 10/01/19  9:15 PM  Result Value Ref Range   Glucose-Capillary 194 (H) 70 - 99 mg/dL    Comment: Glucose reference range applies only to samples taken after fasting for at least 8 hours.  Glucose, capillary     Status: Abnormal   Collection Time: 10/02/19  7:27 AM  Result Value Ref Range   Glucose-Capillary 163 (H) 70 - 99 mg/dL    Comment: Glucose reference range applies only to samples taken after fasting for at least 8 hours.  C-reactive protein     Status: None   Collection Time: 10/02/19  8:31 AM  Result Value Ref Range   CRP 0.7 <1.0 mg/dL    Comment: Performed at [facility], [place] [address], [place], [place] [address]  D-dimer, quantitative (not at [facility])     Status: Abnormal   Collection Time: 10/02/19  8:31 AM  Result Value Ref Range   D-Dimer, Quant 1.08 (H) 0.00 - 0.50 ug/mL-FEU    Comment: (NOTE) At the manufacturer cut-off of 0.50 ug/mL FEU, this assay has been documented to exclude PE with a sensitivity and negative predictive value of 97 to 99%.  At this time, this assay has not been approved by the FDA to exclude DVT/VTE. Results should be correlated with clinical presentation. Performed at [facility], [place] [address], [place], [place] [address]   Magnesium     Status: None   Collection Time: 10/02/19  8:31 AM  Result Value Ref  Range   Magnesium 1.9 1.7 - 2.4 mg/dL    Comment: Performed at [facility] [address] [place], [place] [address]  Brain natriuretic peptide     Status: None   Collection Time: 10/02/19  8:31 AM  Result Value Ref Range   B Natriuretic Peptide 58.9  0.0 - 100.0 pg/mL    Comment: Performed at [facility], [place] [address], [place], [place] [address]  Basic metabolic panel     Status: Abnormal   Collection Time: 10/02/19  8:31 AM  Result Value Ref Range   Sodium 138 135 - 145 mmol/L   Potassium 4.7 3.5 - 5.1 mmol/L   Chloride 106 98 - 111 mmol/L   CO2 19 (L) 22 - 32 mmol/L   Glucose, Bld 155 (H) 70 - 99 mg/dL    Comment: Glucose reference range applies only to samples taken after fasting for at least 8 hours.   BUN 50 (H) 8 - 23 mg/dL   Creatinine, Ser 1.82 (H) 0.61 - 1.24 mg/dL   Calcium 9.9 8.9 - 10.3 mg/dL   GFR calc non Af Amer 38 (L) >60 mL/min   GFR calc Af Amer 44 (L) >60 mL/min   Anion gap 13 5 - 15    Comment: Performed at [facility] [place] [address] [place], [place] [address]  Glucose, capillary     Status: Abnormal   Collection Time: 10/02/19 11:44 AM  Result Value Ref Range   Glucose-Capillary 227 (H) 70 - 99 mg/dL    Comment: Glucose reference range applies only to samples taken after fasting for at least 8 hours.  Glucose, capillary     Status: Abnormal   Collection Time: 10/02/19  3:56 PM  Result Value Ref Range   Glucose-Capillary 241 (H) 70 - 99 mg/dL    Comment: Glucose reference range applies only to samples taken after fasting for at least 8 hours.  CBC     Status: Abnormal   Collection Time: 10/02/19  7:10 PM  Result Value Ref Range   WBC 11.0 (H) 4.0 - 10.5 K/uL   RBC 6.33 (H) 4.22 - 5.81 MIL/uL   Hemoglobin 17.2 (H) 13.0 - 17.0 g/dL   HCT 53.3 (H) 39 - 52 %   MCV 84.2 80.0 - 100.0 fL   MCH 27.2 26.0 - 34.0 pg   MCHC 32.3 30.0 - 36.0 g/dL   RDW 13.6 11.5 - 15.5 %   Platelets 287 150 - 400 K/uL   nRBC 0.0 0.0 - 0.2 %    Comment:  Performed at [facility], [place] [address] [place],  [address]  Comprehensive metabolic panel     Status: Abnormal   Collection Time: 10/02/19  7:10 PM  Result Value Ref Range   Sodium 139 135 - 145 mmol/L   Potassium 5.1 3.5 - 5.1 mmol/L   Chloride 109 98 - 111 mmol/L   CO2 19 (L) 22 - 32 mmol/L   Glucose, Bld 222 (H) 70 - 99 mg/dL    Comment: Glucose reference range applies only to samples taken after fasting for at least 8 hours.   BUN 50 (H) 8 - 23 mg/dL   Creatinine, Ser 1.71 (H) 0.61 - 1.24 mg/dL   Calcium 9.7 8.9 - 10.3 mg/dL   Total Protein 7.1 6.5 - 8.1 g/dL   Albumin 3.2 (L) 3.5 - 5.0 g/dL   AST 39 15 - 41 U/L   ALT 90 (H) 0 - 44 U/L   Alkaline Phosphatase 97 38 - 126 U/L   Total Bilirubin 0.6 0.3 - 1.2 mg/dL   GFR calc non Af Amer 41 (L) >60 mL/min   GFR calc Af Amer 48 (L) >60 mL/min   Anion gap 11 5 - 15    Comment: Performed  at [facility], [place] [address], [place], [place] [address]  Lipase, blood     Status: None   Collection Time: 10/02/19  7:10 PM  Result Value Ref Range   Lipase 41 11 - 51 U/L    Comment: Performed at [facility], [place] [address], [place], [place] [address]  Protime-INR     Status: None   Collection Time: 10/02/19  7:10 PM  Result Value Ref Range   Prothrombin Time 14.2 11.4 - 15.2 seconds   INR 1.1 0.8 - 1.2    Comment: (NOTE) INR goal varies based on device and disease states. Performed at [facility], [place] [address] [place], [place] [address]   Type and screen     Status: None   Collection Time: 10/02/19  7:18 PM  Result Value Ref Range   ABO/RH(D) O POS    Antibody Screen NEG    Sample Expiration      10/05/2019,2359 Performed at [facility], [place] [address] [place], [place] [address]   Lactic acid, plasma     Status: None   Collection Time: 10/02/19  7:33 PM  Result Value Ref Range   Lactic Acid, Venous 1.1 0.5 - 1.9 mmol/L    Comment: Performed at [facility] [place] [address], [place],  [address]  Procalcitonin - Baseline     Status: None   Collection Time: 10/02/19  7:33 PM  Result Value Ref Range   Procalcitonin <0.10 ng/mL    Comment:        Interpretation: PCT (Procalcitonin) <= 0.5 ng/mL: Systemic infection (sepsis) is not likely. Local bacterial infection is possible. (NOTE)       Sepsis PCT Algorithm           Lower Respiratory Tract                                      Infection PCT Algorithm    ----------------------------     ----------------------------         PCT < 0.25 ng/mL                PCT < 0.10 ng/mL          Strongly encourage             Strongly discourage   discontinuation of antibiotics    initiation of antibiotics    ----------------------------     -----------------------------       PCT 0.25 - 0.50 ng/mL            PCT 0.10 - 0.25 ng/mL               OR       >80% decrease in PCT            Discourage initiation of                                            antibiotics      Encourage discontinuation           of antibiotics    ----------------------------     -----------------------------         PCT >= 0.50 ng/mL  PCT 0.26 - 0.50 ng/mL               AND        <80% decrease in PCT             Encourage initiation of                                             antibiotics       Encourage continuation           of antibiotics    ----------------------------     -----------------------------        PCT >= 0.50 ng/mL                  PCT > 0.50 ng/mL               AND         increase in PCT                  Strongly encourage                                      initiation of antibiotics    Strongly encourage escalation           of antibiotics                                     -----------------------------                                           PCT <= 0.25 ng/mL                                                 OR                                        > 80% decrease in PCT                                       Discontinue / Do not initiate                                             antibiotics  Performed at [facility], [address], [place], [place] [address]   Urinalysis, Routine w reflex microscopic     Status: Abnormal   Collection Time: 10/02/19  8:12 PM  Result Value Ref Range   Color, Urine YELLOW YELLOW   APPearance CLOUDY (A) CLEAR   Specific Gravity, Urine 1.020 1.005 - 1.030   pH 5.0 5.0 - 8.0   Glucose, UA >=500 (A) NEGATIVE mg/dL   Hgb urine dipstick  NEGATIVE NEGATIVE   Bilirubin Urine NEGATIVE NEGATIVE   Ketones, ur NEGATIVE NEGATIVE mg/dL   Protein, ur 30 (A) NEGATIVE mg/dL   Nitrite NEGATIVE NEGATIVE   Leukocytes,Ua NEGATIVE NEGATIVE   WBC, UA 0-5 0 - 5 WBC/hpf   Bacteria, UA NONE SEEN NONE SEEN   Mucus PRESENT    Uric Acid Crys, UA PRESENT     Comment: Performed at [facility] [address] [place], [place] [address]  Glucose, capillary     Status: Abnormal   Collection Time: 10/02/19  9:35 PM  Result Value Ref Range   Glucose-Capillary 244 (H) 70 - 99 mg/dL    Comment: Glucose reference range applies only to samples taken after fasting for at least 8 hours.   Recent Results (from the past 240 hour(s))  SARS Coronavirus 2 by RT PCR (hospital order, performed in [facility] hospital lab) Nasopharyngeal Nasopharyngeal Swab     Status: Abnormal   Collection Time: 09/28/19 12:20 PM   Specimen: Nasopharyngeal Swab  Result Value Ref Range Status   SARS Coronavirus 2 POSITIVE (A) NEGATIVE Final    Comment: RESULT CALLED TO, READ BACK BY AND VERIFIED WITH: RN M. COCHRANE [id number] FCP (NOTE) SARS-CoV-2 target nucleic acids are DETECTED  SARS-CoV-2 RNA is generally detectable in upper respiratory specimens  during the acute phase of infection.  Positive results are indicative  of the presence of the identified virus, but do not rule out bacterial infection or co-infection with other pathogens not detected by the test.  Clinical  correlation with patient history and  other diagnostic information is necessary to determine patient infection status.  The expected result is negative.  Fact Sheet for Patients:   StrictlyIdeas.no   Fact Sheet for Healthcare Providers:   BankingDealers.co.za    This test is not yet approved or cleared by the [place] FDA and  has been authorized for detection and/or diagnosis of SARS-CoV-2 by FDA under an Emergency Use Authorization (EUA).  This EUA will remain in effect (meaning this test c an be used) for the duration of  the COVID-19 declaration under Section 564(b)(1) of the Act, 21 U.S.C. section 360-bbb-3(b)(1), unless the authorization is terminated or revoked sooner.  Performed at [facility], [place] [address] [place], [place] [address]   Culture, Urine     Status: None   Collection Time: 09/28/19  4:25 PM   Specimen: Urine, Catheterized  Result Value Ref Range Status   Specimen Description URINE, CATHETERIZED  Final   Special Requests NONE  Final   Culture   Final    NO GROWTH Performed at [facility], [address], [place],  [address]    Report Status 09/30/2019 FINAL  Final   Creatinine: Recent Labs    09/28/19 1220 09/28/19 1432 09/29/19 0436 10/01/19 0918 10/02/19 0831 10/02/19 1910  CREATININE 2.12* 2.08* 1.76* 1.81* 1.82* 1.71*   Baseline Creatinine: 2  Impression/Assessment:  63yo with right chronically obstructed kindey, right flank pain  Plan:  1. 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 Since he has had a ureteral reimplant with psoas hitch retorgrade ureteral stent placement would not be possible. If the patient continues to have significant flank pain in the morning we will pursue right nephrostomy tube placement.   Nicolette Bang 10/02/2019, 9:42 PM

## 2019-10-02 NOTE — Progress Notes (Signed)
X ray at bedside.  Contacted CT and they are sending someone at this time to take patient to have abdomen CT

## 2019-10-03 DIAGNOSIS — J1282 Pneumonia due to Coronavirus disease 2019: Secondary | ICD-10-CM | POA: Diagnosis not present

## 2019-10-03 DIAGNOSIS — U071 COVID-19: Secondary | ICD-10-CM | POA: Diagnosis not present

## 2019-10-03 LAB — D-DIMER, QUANTITATIVE: D-Dimer, Quant: 0.75 ug/mL-FEU — ABNORMAL HIGH (ref 0.00–0.50)

## 2019-10-03 LAB — URINE CULTURE: Culture: <10000 — AB

## 2019-10-03 LAB — BASIC METABOLIC PANEL
Anion gap: 12 (ref 5–15)
BUN: 49 mg/dL — ABNORMAL HIGH (ref 8–23)
CO2: 22 mmol/L (ref 22–32)
Calcium: 9.4 mg/dL (ref 8.9–10.3)
Chloride: 103 mmol/L (ref 98–111)
Creatinine, Ser: 1.62 mg/dL — ABNORMAL HIGH (ref 0.61–1.24)
GFR calc Af Amer: 51 mL/min — ABNORMAL LOW (ref >60)
GFR calc non Af Amer: 44 mL/min — ABNORMAL LOW (ref >60)
Glucose, Bld: 145 mg/dL — ABNORMAL HIGH (ref 70–99)
Potassium: 4.9 mmol/L (ref 3.5–5.1)
Sodium: 137 mmol/L (ref 135–145)

## 2019-10-03 LAB — GLUCOSE, CAPILLARY
Glucose-Capillary: 159 mg/dL — ABNORMAL HIGH (ref 70–99)
Glucose-Capillary: 182 mg/dL — ABNORMAL HIGH (ref 70–99)
Glucose-Capillary: 228 mg/dL — ABNORMAL HIGH (ref 70–99)
Glucose-Capillary: 154 mg/dL — ABNORMAL HIGH (ref 70–99)

## 2019-10-03 LAB — MAGNESIUM: Magnesium: 1.8 mg/dL (ref 1.7–2.4)

## 2019-10-03 LAB — CBC
HCT: 51.8 % (ref 39.0–52.0)
Hemoglobin: 16.8 g/dL (ref 13.0–17.0)
MCH: 27.8 pg (ref 26.0–34.0)
MCHC: 32.4 g/dL (ref 30.0–36.0)
MCV: 85.6 fL (ref 80.0–100.0)
Platelets: 299 10*3/uL (ref 150–400)
RBC: 6.05 MIL/uL — ABNORMAL HIGH (ref 4.22–5.81)
RDW: 13.7 % (ref 11.5–15.5)
WBC: 11.5 10*3/uL — ABNORMAL HIGH (ref 4.0–10.5)
nRBC: 0 % (ref 0.0–0.2)

## 2019-10-03 LAB — C-REACTIVE PROTEIN: CRP: 0.7 mg/dL (ref <1.0)

## 2019-10-03 LAB — PROCALCITONIN: Procalcitonin: <0.1 ng/mL

## 2019-10-03 MED ORDER — AMLODIPINE BESYLATE 10 MG PO TABS
10.0000 mg | ORAL_TABLET | Freq: Every day | ORAL | Status: DC
  Administered 2019-10-03 – 2019-10-05 (×3): 10 mg via ORAL
  Filled 2019-10-03 (×3): qty 1

## 2019-10-03 MED ORDER — LACTATED RINGERS IV SOLN: INTRAVENOUS | Status: AC

## 2019-10-03 NOTE — Plan of Care (Signed)
  Problem: Education: Goal: Ability to describe self-care measures that may prevent or decrease complications (Diabetes Survival Skills Education) will improve Outcome: Progressing Goal: Individualized Educational Video(s) Outcome: Progressing   Problem: Coping: Goal: Ability to adjust to condition or change in health will improve Outcome: Progressing   Problem: Fluid Volume: Goal: Ability to maintain a balanced intake and output will improve Outcome: Progressing   Problem: Health Behavior/Discharge Planning: Goal: Ability to identify and utilize available resources and services will improve Outcome: Progressing Goal: Ability to manage health-related needs will improve Outcome: Progressing   Problem: Metabolic: Goal: Ability to maintain appropriate glucose levels will improve Outcome: Progressing   Problem: Nutritional: Goal: Maintenance of adequate nutrition will improve Outcome: Progressing Goal: Progress toward achieving an optimal weight will improve Outcome: Progressing   Problem: Skin Integrity: Goal: Risk for impaired skin integrity will decrease Outcome: Progressing   Problem: Tissue Perfusion: Goal: Adequacy of tissue perfusion will improve Outcome: Progressing   Problem: Education: Goal: Knowledge of General Education information will improve Description: Including pain rating scale, medication(s)/side effects and non-pharmacologic comfort measures Outcome: Progressing   Problem: Health Behavior/Discharge Planning: Goal: Ability to manage health-related needs will improve Outcome: Progressing   Problem: Clinical Measurements: Goal: Ability to maintain clinical measurements within normal limits will improve Outcome: Progressing Goal: Will remain free from infection Outcome: Progressing Goal: Diagnostic test results will improve Outcome: Progressing Goal: Respiratory complications will improve Outcome: Progressing Goal: Cardiovascular complication will  be avoided Outcome: Progressing   Problem: Activity: Goal: Risk for activity intolerance will decrease Outcome: Progressing   Problem: Nutrition: Goal: Adequate nutrition will be maintained Outcome: Progressing   Problem: Coping: Goal: Level of anxiety will decrease Outcome: Progressing   Problem: Elimination: Goal: Will not experience complications related to bowel motility Outcome: Progressing Goal: Will not experience complications related to urinary retention Outcome: Progressing   Problem: Pain Managment: Goal: General experience of comfort will improve Outcome: Progressing   Problem: Safety: Goal: Ability to remain free from injury will improve Outcome: Progressing   Problem: Skin Integrity: Goal: Risk for impaired skin integrity will decrease Outcome: Progressing   Problem: Education: Goal: Knowledge of risk factors and measures for prevention of condition will improve Outcome: Progressing   Problem: Coping: Goal: Psychosocial and spiritual needs will be supported Outcome: Progressing   Problem: Respiratory: Goal: Will maintain a patent airway Outcome: Progressing Goal: Complications related to the disease process, condition or treatment will be avoided or minimized Outcome: Progressing   

## 2019-10-03 NOTE — Progress Notes (Signed)
Progress Note    [name]  JSH:[id number] DOB: 03/29/1955  DOA: 09/28/2019 PCP: [name], [name]      Brief Narrative:    Medical records reviewed and are as summarized below:  [name] is a 64 y.o. male  with medical history significant of  BPH with obstruction, prostate cancer on active surveillance, hypertension, hyperlipidemia, diabetes mellitus, GERD presented to emergency department with generalized weakness, nausea and vomiting for about 1 week duration.   Assessment/Plan:   COVID-19 infection/pneumonia with acute hypoxemic respiratory failure: He has mild to moderate disease, got 1 dose of the Moderna than a vaccine few weeks ago, he will finish his steroids and Remdesivir course.  Advance activity.  Encouraged to sit up in chair use I-S and flutter valve for pulmonary toiletry.  Monitor inflammatory markers if stable likely discharge on 10/03/2019.    Recent Labs  Lab 09/28/19 1220 09/28/19 1432 09/28/19 1700 09/28/19 1700 09/29/19 0436 09/30/19 0352 10/01/19 0519 10/02/19 0831 10/02/19 1933 10/03/19 0159  CRP  --   --  4.3*   < > 3.9* 2.5* 1.3* 0.7  --  0.7  DDIMER  --   --  0.89*   < > 1.18* 0.97* 0.92* 1.08*  --  0.75*  BNP  --   --  31.6  --   --   --   --  58.9  --   --   PROCALCITON  --   --  0.13  --   --   --   --   --  <0.10 <0.10  LATICACIDVEN  --  1.2  --   --   --   --   --   --  1.1  --   SARSCOV2NAA POSITIVE*  --   --   --   --   --   --   --   --   --    < > = values in this interval not displayed.       Sinus tachycardia: This is due to underlying infection.  Continue to monitor.  CKD stage IIIb with metabolic acidosis, hyperkalemia: No function now close to baseline creatinine of around 1.9-2, hyperkalemia has resolved outpatient follow-up with PCP and urology.  Prostate cancer, BPH with obstruction, moderate to severe right hydronephrosis, moderate to severe prostatic enlargement with indentation of bladder base: Now with right  lower abdominal pain and discomfort starting evening of 10/02/2019, patient states off and has been going on intermittently for several weeks.  Urology consulted who recommends right-sided nephrostomy tube, IR will place it likely Sunday or Monday.  Empiric Rocephin till then will be continued.  Continue pain control.  Constipation.  Placed on bowel regimen.  Hypertension.  Pain control and blood pressure medication Norvasc added to beta-blocker.   Body mass index is 28.47 kg/m.  Diet Order            Diet clear liquid Room service appropriate? Yes; Fluid consistency: Thin  Diet effective now                  Medications:   . alum & mag hydroxide-simeth  30 mL Oral TID  . vitamin C  500 mg Oral Daily  . aspirin EC  81 mg Oral Daily  . atorvastatin  20 mg Oral Daily  . bisacodyl  10 mg Rectal Daily  . dexamethasone (DECADRON) injection  4 mg Intravenous Q24H  . docusate sodium  200 mg Oral BID  .  enoxaparin (LOVENOX) injection  40 mg Subcutaneous Q24H  . hydrocortisone  25 mg Rectal BID  . insulin aspart  0-15 Units Subcutaneous TID WC  . insulin aspart  0-5 Units Subcutaneous QHS  . metoprolol tartrate  50 mg Oral Daily  . pantoprazole (PROTONIX) IV  40 mg Intravenous Q12H  . polyethylene glycol  17 g Oral BID  . sodium bicarbonate  1,300 mg Oral TID  . zinc sulfate  220 mg Oral Daily   Continuous Infusions: . cefTRIAXone (ROCEPHIN)  IV Stopped (10/02/19 1900)  . lactated ringers 75 mL/hr at 10/03/19 0700     Anti-infectives (From admission, onward)   Start     Dose/Rate Route Frequency Ordered Stop   10/02/19 1800  cefTRIAXone (ROCEPHIN) 1 g in sodium chloride 0.9 % 100 mL IVPB     Discontinue     1 g 200 mL/hr over 30 Minutes Intravenous Every 24 hours 10/02/19 1732     09/29/19 1000  remdesivir 100 mg in sodium chloride 0.9 % 100 mL IVPB       "Followed by" Linked Group Details   100 mg 200 mL/hr over 30 Minutes Intravenous Daily 09/28/19 1624 10/02/19 1900    09/28/19 1630  remdesivir 200 mg in sodium chloride 0.9% 250 mL IVPB       "Followed by" Linked Group Details   200 mg 580 mL/hr over 30 Minutes Intravenous Once 09/28/19 1624 09/28/19 1915   09/28/19 1500  cefTRIAXone (ROCEPHIN) 1 g in sodium chloride 0.9 % 100 mL IVPB  Status:  Discontinued        1 g 200 mL/hr over 30 Minutes Intravenous Every 24 hours 09/28/19 1448 09/29/19 1209       Family Communication/Anticipated D/C date and plan/Code Status   DVT prophylaxis: enoxaparin (LOVENOX) injection 40 mg Start: 09/28/19 1430 SCDs Start: 09/28/19 1417     Code Status: Full Code  Family Communication: Plan discussed with patient Disposition Plan:    Status is: Inpatient  Remains inpatient appropriate because:IV treatments appropriate due to intensity of illness or inability to take PO and Inpatient level of care appropriate due to severity of illness   Dispo: The patient is from: Home              Anticipated d/c is to: Home              Anticipated d/c date is: 3 days              Patient currently is not medically stable to d/c.  Consults.  Urology, IR   Subjective:   Patient in bed, appears comfortable, denies any headache, no fever, no chest pain or pressure, no shortness of breath , continues to have some right lower abdominal discomfort and abdominal pain. No focal weakness.   Objective:     Vitals:   10/02/19 2144 10/03/19 0422 10/03/19 0426 10/03/19 0517  BP: 130/90   (!) 156/86  Pulse: 69 71 69 64  Resp: 16 16 17 15   Temp: 98 F (36.7 C)   97.7 F (36.5 C)  TempSrc: Oral   Oral  SpO2: 92% (!) 86% 95% 95%  Weight:    80 kg  Height:       No data found.   Intake/Output Summary (Last 24 hours) at 10/03/2019 1029 Last data filed at 10/03/2019 0952 Gross per 24 hour  Intake 3160.21 ml  Output 900 ml  Net 2260.21 ml   Filed Weights   09/30/19 0409  10/01/19 0513 10/03/19 0517  Weight: 79.1 kg 80.6 kg 80 kg    Exam:  Awake Alert, No new F.N  deficits, Normal affect [place].AT,PERRAL Supple Neck,No JVD, No cervical lymphadenopathy appriciated.  Symmetrical Chest wall movement, Good air movement bilaterally, CTAB RRR,No Gallops, Rubs or new Murmurs, No Parasternal Heave +ve B.Sounds, Abd Soft, Mild R lower tenderness, No organomegaly appriciated, No rebound - guarding or rigidity. No Cyanosis, Clubbing or edema, No new Rash or bruise   Data Reviewed:   I have personally reviewed following labs and imaging studies:  Labs: Labs show the following:   Basic Metabolic Panel: Recent Labs  Lab 09/28/19 1220 09/28/19 1432 09/29/19 0436 09/29/19 0436 09/30/19 0352 10/01/19 0519 10/01/19 0918 10/01/19 0918 10/02/19 0831 10/02/19 0831 10/02/19 1910 10/03/19 0159  NA   < >  --  139  --   --   --  135  --  138  --  139 137  K   < >  --  5.0   < >  --   --  5.7*   < > 4.7   < > 5.1 4.9  CL   < >  --  106  --   --   --  108  --  106  --  109 103  CO2   < >  --  19*  --   --   --  14*  --  19*  --  19* 22  GLUCOSE   < >  --  94  --   --   --  263*  --  155*  --  222* 145*  BUN   < >  --  48*  --   --   --  46*  --  50*  --  50* 49*  CREATININE   < > 2.08* 1.76*  --   --   --  1.81*  --  1.82*  --  1.71* 1.62*  CALCIUM   < >  --  9.0  --   --   --  9.6  --  9.9  --  9.7 9.4  MG   < > 2.6* 2.4  --  2.3 2.0  --   --  1.9  --   --  1.8  PHOS  --  2.7 2.8  --  3.0 3.6  --   --   --   --   --   --    < > = values in this interval not displayed.   GFR Estimated Creatinine Clearance: 45.2 mL/min (A) (by C-G formula based on SCr of 1.62 mg/dL (H)). Liver Function Tests: Recent Labs  Lab 09/28/19 1220 09/29/19 0436 10/02/19 1910  AST 19 17 39  ALT 26 24 90*  ALKPHOS 100 94 97  BILITOT 1.5* 0.6 0.6  PROT 7.9 7.3 7.1  ALBUMIN 3.3* 3.1* 3.2*   Recent Labs  Lab 09/28/19 1432 10/02/19 1910  LIPASE 30 41   No results for input(s): AMMONIA in the last 168 hours. Coagulation profile Recent Labs  Lab 09/28/19 1220  10/02/19 1910  INR 1.0 1.1    CBC: Recent Labs  Lab 09/28/19 1220 09/28/19 1220 09/28/19 1432 09/29/19 0436 10/01/19 0918 10/02/19 1910 10/03/19 0159  WBC 7.5   < > 7.4 6.3 11.1* 11.0* 11.5*  NEUTROABS 6.2  --   --   --  9.7*  --   --   HGB 17.1*   < > 17.3* 16.4  18.2* 17.2* 16.8  HCT 52.0   < > 53.0* 50.1 56.1* 53.3* 51.8  MCV 83.3   < > 83.5 83.6 83.4 84.2 85.6  PLT 208   < > 201 205 334 287 299   < > = values in this interval not displayed.   Cardiac Enzymes: No results for input(s): CKTOTAL, CKMB, CKMBINDEX, TROPONINI in the last 168 hours. BNP (last 3 results) No results for input(s): PROBNP in the last 8760 hours. CBG: Recent Labs  Lab 10/02/19 0727 10/02/19 1144 10/02/19 1556 10/02/19 2135 10/03/19 0726  GLUCAP 163* 227* 241* 244* 159*   D-Dimer: Recent Labs    10/02/19 0831 10/03/19 0159  DDIMER 1.08* 0.75*   Hgb A1c: No results for input(s): HGBA1C in the last 72 hours. Lipid Profile: No results for input(s): CHOL, HDL, LDLCALC, TRIG, CHOLHDL, LDLDIRECT in the last 72 hours. Thyroid function studies: No results for input(s): TSH, T4TOTAL, T3FREE, THYROIDAB in the last 72 hours.  Invalid input(s): FREET3 Anemia work up: Recent Labs    10/01/19 [address]   Sepsis Labs: Recent Labs  Lab 09/28/19 1432 09/28/19 1432 09/28/19 1700 09/29/19 0436 10/01/19 0918 10/02/19 1910 10/02/19 1933 10/03/19 0159  PROCALCITON  --   --  0.13  --   --   --  <0.10 <0.10  WBC 7.4   < >  --  6.3 11.1* 11.0*  --  11.5*  LATICACIDVEN 1.2  --   --   --   --   --  1.1  --    < > = values in this interval not displayed.    Microbiology Recent Results (from the past 240 hour(s))  SARS Coronavirus 2 by RT PCR (hospital order, performed in [facility] hospital lab) Nasopharyngeal Nasopharyngeal Swab     Status: Abnormal   Collection Time: 09/28/19 12:20 PM   Specimen: Nasopharyngeal Swab  Result Value Ref Range Status   SARS Coronavirus 2 POSITIVE (A)  NEGATIVE Final    Comment: RESULT CALLED TO, READ BACK BY AND VERIFIED WITH: RN M. COCHRANE [id number] FCP (NOTE) SARS-CoV-2 target nucleic acids are DETECTED  SARS-CoV-2 RNA is generally detectable in upper respiratory specimens  during the acute phase of infection.  Positive results are indicative  of the presence of the identified virus, but do not rule out bacterial infection or co-infection with other pathogens not detected by the test.  Clinical correlation with patient history and  other diagnostic information is necessary to determine patient infection status.  The expected result is negative.  Fact Sheet for Patients:   StrictlyIdeas.no   Fact Sheet for Healthcare Providers:   BankingDealers.co.za    This test is not yet approved or cleared by the [place] FDA and  has been authorized for detection and/or diagnosis of SARS-CoV-2 by FDA under an Emergency Use Authorization (EUA).  This EUA will remain in effect (meaning this test c an be used) for the duration of  the COVID-19 declaration under Section 564(b)(1) of the Act, 21 U.S.C. section 360-bbb-3(b)(1), unless the authorization is terminated or revoked sooner.  Performed at [facility], [place] [address], [place], [place] [address]   Culture, Urine     Status: None   Collection Time: 09/28/19  4:25 PM   Specimen: Urine, Catheterized  Result Value Ref Range Status   Specimen Description URINE, CATHETERIZED  Final   Special Requests NONE  Final   Culture   Final    NO GROWTH Performed  at [facility], [place] [address], [place], [place] [address]    Report Status 09/30/2019 FINAL  Final    Procedures and diagnostic studies:  Signature  Lala Lund M.D on 10/03/2019 at 10:29 AM   -  To page go to [email or web address]

## 2019-10-03 NOTE — Progress Notes (Signed)
Patient had a small hard BM with some separate blood noted. Patient does have hemorrhoids. Residue of the suppositories noted in [facility]. Patient still does not want Soap Suds Enema at this time, states he will try to have another BM in an hour or so. Will continue to monitor.

## 2019-10-03 NOTE — Progress Notes (Signed)
   10/02/19 1921  Assess: MEWS Score  ECG Heart Rate (!) 157  Level of Consciousness Alert  Assess: MEWS Score  MEWS Temp 0  MEWS Systolic 0  MEWS Pulse 3  MEWS RR 0  MEWS LOC 0  MEWS Score 3  MEWS Score Color Yellow  Assess: if the MEWS score is Yellow or Red  Were vital signs taken at a resting state? Yes  Focused Assessment Documented focused assessment  Early Detection of Sepsis Score *See Row Information* Low  MEWS guidelines implemented *See Row Information* No, vital signs rechecked  Notify: Provider  Provider Wardell Honour, NP  Date Provider Notified 10/02/19  Time Provider Notified 1940  Notification Type Page  Notification Reason Other (Comment) (15 sec SVT per tele. Patient asymptomatic.)  Response No new orders   Patient denies chest pain, denies SOB. Patient resting in bed, no distress noted. Will continue to monitor.

## 2019-10-03 NOTE — Patient Instructions (Addendum)
Visit Information  Goals Addressed            This Visit's Progress   . Pharmacy Care Plan       CARE PLAN ENTRY  Current Barriers:  . Chronic Disease Management support, education, and care coordination needs related to Hypertension, Hyperlipidemia, Diabetes, and Chronic Kidney Disease   Hypertension . Pharmacist Clinical Goal(s): o Over the next 90 days, patient will work with PharmD and providers to maintain BP goal <130/80 . Current regimen:  o Metoprolol tartrate 50mg  daily o Olmesartan/ HCTZ 40/25mg  daily . Interventions: o Provided dietary and exercise recommendations . Patient self care activities - Over the next 90 days, patient will: o Check BP weekly, document, and provide at future appointments o Ensure daily salt intake < 2300 mg/day o Choose low-sodium/no-sodium canned goods o Exercise 30 minutes 5 times weekly  Diabetes . Pharmacist Clinical Goal(s): o Over the next 90 days, patient will work with PharmD and providers to achieve A1c goal <7% . Current regimen:  o Rybelsus 14mg  daily 30 minutes before breakfast o Tradjenta 5mg  daily . Interventions: o Discussed with PCP changing Tradjenta to [place] for additional kidney protective benefits and blood glucose lowering. PCP to discuss with patient o Advised patient to check blood sugar first thing in the morning before breakfast . Patient self care activities - Over the next 90 days, patient will: o Check blood sugar once daily, document, and provide at future appointments o Contact provider with any episodes of hypoglycemia o Exercise 30 minutes 5 times weekly  Hyperlipidemia Lab Results  Component Value Date/Time   LDLCALC 106 (H) 08/26/2019 03:42 PM   . Pharmacist Clinical Goal(s): o Over the next 90 days, patient will work with PharmD and providers to achieve LDL goal < 70 . Current regimen:  o Atorvastatin 20mg  daily . Interventions: o Provided dietary and exercise recommendations o Discuss  increasing atorvastatin 20mg  to atorvastatin 40mg  daily with PCP due to LDL >100 and ASCVD risk . Patient self care activities - Over the next 90 days, patient will: o Take cholesterol medication daily as directed o Exercise 30 minutes 5 times weekly  Medication management . Pharmacist Clinical Goal(s): o Over the next 90 days, patient will work with PharmD and providers to achieve optimal medication adherence . Current pharmacy: [facility] . Interventions o Comprehensive medication review performed. o Continue current medication management strategy . Patient self care activities - Over the next 90 days, patient will: o Focus on medication adherence by using pill box o Consider medication synchronization and pill packaging program as outlined be PharmD o Take medications as prescribed o Report any questions or concerns to PharmD and/or provider(s)  Please see past updates related to this goal by clicking on the "Past Updates" button in the selected goal         The patient verbalized understanding of instructions provided today and agreed to receive a mailed copy of patient instruction and/or educational materials.  Telephone follow up appointment with pharmacy team member scheduled for: 11/04/19 @ 1 PM  Jannette Fogo, PharmD Clinical Pharmacist [facility] [phone]    Chronic Kidney Disease, Adult Chronic kidney disease (CKD) occurs when the kidneys become damaged slowly over a long period of time. The kidneys are a pair of organs that do many important jobs in the body, including:  Removing waste and extra fluid from the blood to make urine.  Making hormones that maintain the amount of fluid in tissues and blood vessels.  Maintaining the right amount of fluids and chemicals in the body. A small amount of kidney damage may not cause problems, but a large amount of damage may make it hard or impossible for the kidneys to work the way they should. If  steps are not taken to slow down kidney damage or to stop it from getting worse, the kidneys may stop working permanently (end-stage renal disease or ESRD). Most of the time, CKD does not go away, but it can often be controlled. People who have CKD are usually able to live normal lives. What are the causes? The most common causes of this condition are diabetes and high blood pressure (hypertension). Other causes include:  Heart and blood vessel (cardiovascular) disease.  Kidney diseases, such as: ? Glomerulonephritis. ? Interstitial nephritis. ? Polycystic kidney disease. ? Renal vascular disease.  Diseases that affect the immune system.  Genetic diseases.  Medicines that damage the kidneys, such as anti-inflammatory medicines.  Being around or being in contact with poisonous (toxic) substances.  A kidney or urinary infection that occurs again and again (recurs).  Vasculitis. This is swelling or inflammation of the blood vessels.  A problem with urine flow that may be caused by: ? Cancer. ? Having kidney stones more than one time. ? An enlarged prostate, in males. What increases the risk? You are more likely to develop this condition if you:  Are older than age 39.  Are male.  Are African-American, Hispanic, Asian, [place], or American [place].  Are a current or former smoker.  Are obese.  Have a family history of kidney disease or failure.  Often take medicines that are damaging to the kidneys. What are the signs or symptoms? Symptoms of this condition include:  Swelling (edema) of the face, legs, ankles, or feet.  Tiredness (lethargy) and having less energy.  Nausea or vomiting.  Confusion or trouble concentrating.  Problems with urination, such as: ? Painful or burning feeling during urination. ? Decreased urine production. ? Frequent urination, especially at night. ? Bloody urine.  Muscle twitches and cramps, especially in the  legs.  Shortness of breath.  Weakness.  Loss of appetite.  Metallic taste in the mouth.  Trouble sleeping.  Dry, itchy skin.  A low blood count (anemia).  Pale lining of the eyelids and surface of the eye (conjunctiva). Symptoms develop slowly and may not be obvious until the kidney damage becomes severe. It is possible to have kidney disease for years without having any symptoms. How is this diagnosed? This condition may be diagnosed based on:  Blood tests.  Urine tests.  Imaging tests, such as an ultrasound or CT scan.  A test in which a sample of tissue is removed from the kidneys to be examined under a microscope (kidney biopsy). These test results will help your health care provider determine how serious the CKD is. How is this treated? There is no cure for most cases of this condition, but treatment usually relieves symptoms and prevents or slows the progression of the disease. Treatment may include:  Making diet changes, which may require you to avoid alcohol, salty foods (sodium), and foods that are high in potassium, calcium, and protein.  Medicines: ? To lower blood pressure. ? To control blood glucose. ? To relieve anemia. ? To relieve swelling. ? To protect your bones. ? To improve the balance of electrolytes in your blood.  Removing toxic waste from the body through types of dialysis, if the kidneys can no longer do  their job (kidney failure).  Managing any other conditions that are causing your CKD or making it worse. Follow these instructions at home: Medicines  Take over-the-counter and prescription medicines only as told by your health care provider. The dose of some medicines that you take may need to be adjusted.  Do not take any new medicines unless approved by your health care provider. Many medicines can worsen your kidney damage.  Do not take any vitamin and mineral supplements unless approved by your health care provider. Many nutritional  supplements can worsen your kidney damage. General instructions  Follow your prescribed diet as told by your health care provider.  Do not use any products that contain nicotine or tobacco, such as cigarettes and e-cigarettes. If you need help quitting, ask your health care provider.  Monitor and track your blood pressure at home. Report changes in your blood pressure as told by your health care provider.  If you are being treated for diabetes, monitor and track your blood sugar (blood glucose) levels as told by your health care provider.  Maintain a healthy weight. If you need help with this, ask your health care provider.  Start or continue an exercise plan. Exercise at least 30 minutes a day, 5 days a week.  Keep your immunizations up to date as told by your health care provider.  Keep all follow-up visits as told by your health care provider. This is important. Where to find more information  American Association of Kidney Patients: BombTimer.gl  National Kidney Foundation: www.kidney.Bureau: https://mathis.com/  Life Options Rehabilitation Program: www.lifeoptions.org and www.kidneyschool.org Contact a health care provider if:  Your symptoms get worse.  You develop new symptoms. Get help right away if:  You develop symptoms of ESRD, which include: ? Headaches. ? Numbness in the hands or feet. ? Easy bruising. ? Frequent hiccups. ? Chest pain. ? Shortness of breath. ? Lack of menstruation, in women.  You have a fever.  You have decreased urine production.  You have pain or bleeding when you urinate. Summary  Chronic kidney disease (CKD) occurs when the kidneys become damaged slowly over a long period of time.  The most common causes of this condition are diabetes and high blood pressure (hypertension).  There is no cure for most cases of this condition, but treatment usually relieves symptoms and prevents or slows the progression of the disease.  Treatment may include a combination of medicines and lifestyle changes. This information is not intended to replace advice given to you by your health care provider. Make sure you discuss any questions you have with your health care provider. Document Revised: 02/14/2017 Document Reviewed: 04/11/2016 Elsevier Patient Education  2020 [employer].

## 2019-10-03 NOTE — Progress Notes (Signed)
   Dr Cain Saupe has spoken to Dr Earleen Newport about this pt IR aware of R PCN need  Covid + 09/28/19  Rt flank pain He has a hx of a right ureteral reimplant with psoas hitch for a right ureteral avulsion in 2018. He has had decline of his right renal function and CT on admission showed right cortical atrophy with moderate right hydroureteronephrosis to the ureteral anastomosis  CT yesterday:  IMPRESSION: 1. No acute finding in the abdomen or pelvis on a noncontrast scan. 2. Chronic right renal atrophy with moderate/severe right hydronephrosis. There is severe distal hydroureter extending from the bladder up to the surgical anastomosis at the psoas hitch, also similar to prior. 3. Nonobstructing small calculus in the inferior right kidney. 4. Bilateral right greater than left pulmonary opacities in the lung bases, decreased from prior.  Request for non emergent R PCN Will likely be performed in IR early next week Dr Alyson Ingles aware and agreeable  IR PA will see pt Mon or Tues for procedure

## 2019-10-03 NOTE — Progress Notes (Signed)
This RN attempted to administer Dulcolax suppository and Soap Suds Enema several times earlier. Patient wanted to attempt to have BM before medications given, then patient stated that his buttocks area was too sore for suppositories and enema. Patient took Miralax and Colace as ordered. Patient repeatedly stating "maybe later" for suppository and enema. Patient now agrees to suppository. Dulcolax and Anusol suppositories administered. Patient resting in bed. Patient wants to wait and see if suppository works before given enema. Will continue to monitor.

## 2019-10-04 ENCOUNTER — Inpatient Hospital Stay (HOSPITAL_COMMUNITY): Payer: Medicare Other

## 2019-10-04 DIAGNOSIS — J1282 Pneumonia due to Coronavirus disease 2019: Secondary | ICD-10-CM | POA: Diagnosis not present

## 2019-10-04 DIAGNOSIS — U071 COVID-19: Secondary | ICD-10-CM | POA: Diagnosis not present

## 2019-10-04 LAB — CBC WITH DIFFERENTIAL/PLATELET
Abs Immature Granulocytes: 0.09 10*3/uL — ABNORMAL HIGH (ref 0.00–0.07)
Basophils Absolute: 0 10*3/uL (ref 0.0–0.1)
Basophils Relative: 0 %
Eosinophils Absolute: 0.2 10*3/uL (ref 0.0–0.5)
Eosinophils Relative: 2 %
HCT: 50.6 % (ref 39.0–52.0)
Hemoglobin: 16.6 g/dL (ref 13.0–17.0)
Immature Granulocytes: 1 %
Lymphocytes Relative: 14 %
Lymphs Abs: 1.6 10*3/uL (ref 0.7–4.0)
MCH: 28 pg (ref 26.0–34.0)
MCHC: 32.8 g/dL (ref 30.0–36.0)
MCV: 85.3 fL (ref 80.0–100.0)
Monocytes Absolute: 0.9 10*3/uL (ref 0.1–1.0)
Monocytes Relative: 9 %
Neutro Abs: 8.1 10*3/uL — ABNORMAL HIGH (ref 1.7–7.7)
Neutrophils Relative %: 74 %
Platelets: 271 10*3/uL (ref 150–400)
RBC: 5.93 MIL/uL — ABNORMAL HIGH (ref 4.22–5.81)
RDW: 13.4 % (ref 11.5–15.5)
WBC: 10.9 10*3/uL — ABNORMAL HIGH (ref 4.0–10.5)
nRBC: 0 % (ref 0.0–0.2)

## 2019-10-04 LAB — COMPREHENSIVE METABOLIC PANEL
ALT: 77 U/L — ABNORMAL HIGH (ref 0–44)
AST: 30 U/L (ref 15–41)
Albumin: 2.9 g/dL — ABNORMAL LOW (ref 3.5–5.0)
Alkaline Phosphatase: 83 U/L (ref 38–126)
Anion gap: 9 (ref 5–15)
BUN: 41 mg/dL — ABNORMAL HIGH (ref 8–23)
CO2: 25 mmol/L (ref 22–32)
Calcium: 9.2 mg/dL (ref 8.9–10.3)
Chloride: 104 mmol/L (ref 98–111)
Creatinine, Ser: 1.75 mg/dL — ABNORMAL HIGH (ref 0.61–1.24)
GFR calc Af Amer: 46 mL/min — ABNORMAL LOW (ref >60)
GFR calc non Af Amer: 40 mL/min — ABNORMAL LOW (ref >60)
Glucose, Bld: 142 mg/dL — ABNORMAL HIGH (ref 70–99)
Potassium: 5 mmol/L (ref 3.5–5.1)
Sodium: 138 mmol/L (ref 135–145)
Total Bilirubin: 0.9 mg/dL (ref 0.3–1.2)
Total Protein: 6.4 g/dL — ABNORMAL LOW (ref 6.5–8.1)

## 2019-10-04 LAB — GLUCOSE, CAPILLARY
Glucose-Capillary: 146 mg/dL — ABNORMAL HIGH (ref 70–99)
Glucose-Capillary: 244 mg/dL — ABNORMAL HIGH (ref 70–99)
Glucose-Capillary: 133 mg/dL — ABNORMAL HIGH (ref 70–99)
Glucose-Capillary: 234 mg/dL — ABNORMAL HIGH (ref 70–99)

## 2019-10-04 LAB — C-REACTIVE PROTEIN: CRP: 0.6 mg/dL (ref <1.0)

## 2019-10-04 LAB — D-DIMER, QUANTITATIVE: D-Dimer, Quant: 0.58 ug/mL-FEU — ABNORMAL HIGH (ref 0.00–0.50)

## 2019-10-04 LAB — BRAIN NATRIURETIC PEPTIDE: B Natriuretic Peptide: 66.7 pg/mL (ref 0.0–100.0)

## 2019-10-04 LAB — PROCALCITONIN: Procalcitonin: 0.1 ng/mL

## 2019-10-04 LAB — MAGNESIUM: Magnesium: 1.6 mg/dL — ABNORMAL LOW (ref 1.7–2.4)

## 2019-10-04 MED ORDER — MAGNESIUM SULFATE 2 GM/50ML IV SOLN
2.0000 g | Freq: Once | INTRAVENOUS | Status: AC
  Administered 2019-10-04: 2 g via INTRAVENOUS
  Filled 2019-10-04: qty 50

## 2019-10-04 MED ORDER — MIDAZOLAM HCL 2 MG/2ML IJ SOLN
INTRAMUSCULAR | Status: AC
  Filled 2019-10-04: qty 4

## 2019-10-04 MED ORDER — FENTANYL CITRATE (PF) 100 MCG/2ML IJ SOLN
INTRAMUSCULAR | Status: AC
  Filled 2019-10-04: qty 4

## 2019-10-04 MED ORDER — HYDRALAZINE HCL 50 MG PO TABS
50.0000 mg | ORAL_TABLET | Freq: Three times a day (TID) | ORAL | Status: DC
  Administered 2019-10-04 – 2019-10-05 (×2): 50 mg via ORAL
  Filled 2019-10-04 (×4): qty 1

## 2019-10-04 NOTE — Sedation Documentation (Signed)
After speaking with NP and Dr Annamaria Boots, pt has decided to abort procedure at this time. He would like to discuss options with his urologist.

## 2019-10-04 NOTE — Progress Notes (Signed)
Occupational Therapy Treatment Patient Details Name: [name] MRN: [id number] DOB: 24-Aug-1955 Today's Date: 10/04/2019    History of present illness [name] is a 64 y.o. male with medical history significant of BPH with obstruction, prostate cancer on active surveillance, hypertension, hyperlipidemia, diabetes mellitus, GERD presented to emergency department with generalized weakness, nausea and vomiting for about 1 week duration.Pt dx with COVID 19 with acute hypoxic respiratory failure, AKI on CKD stage III, and hyponatremia.     OT comments  Patient requesting assist with bath on arrival.  Able to stand with min guard and walk to sink with one hand assist.  Patient still leaning and reaching for surfaces and not wanting to use RW.  Completed full body wash up at sink with min assist for UB and mod assist for LB.  Patient still needing significant verbal cueing for safety and sequencing.  Patient is very repetitive and asks for assistance with simple things (like rinsing wash cloth).  Seems unsure about his movements and how to complete task.  Will continue to follow with OT acutely to address the deficits listed below.    Follow Up Recommendations  Supervision/Assistance - 24 hour;Other (comment);SNF (If patient can recieve more assist at home then [facility])    Equipment Recommendations       Recommendations for Other Services      Precautions / Restrictions Precautions Precautions: Fall Precaution Comments: R foot drop, h/o falls Required Braces or Orthoses: Other Brace Other Brace: reports he has AFO        Mobility Bed Mobility Overal bed mobility: Modified Independent                Transfers Overall transfer level: Needs assistance Equipment used: None Transfers: Sit to/from Stand Sit to Stand: Min guard              Balance Overall balance assessment: Needs assistance Sitting-balance support: Feet supported;No upper extremity supported Sitting  balance-Leahy Scale: Fair     Standing balance support: Single extremity supported Standing balance-Leahy Scale: Poor Standing balance comment: needs external support in standing.                            ADL either performed or assessed with clinical judgement   ADL Overall ADL's : Needs assistance/impaired     Grooming: Minimal assistance;Cueing for sequencing;Cueing for safety;Standing Grooming Details (indicate cue type and reason): Supervision for standing but needing assist with sequencing and figuring out how to use tools/items Upper Body Bathing: Minimal assistance;Standing   Lower Body Bathing: Moderate assistance;Sit to/from stand   Upper Body Dressing : Minimal assistance;Sitting                   Functional mobility during ADLs: Minimal assistance General ADL Comments: Unsteady with mobility and cognition limiting independence     Vision       Perception     Praxis      Cognition Arousal/Alertness: Awake/alert Behavior During Therapy: Flat affect Overall Cognitive Status: No family/caregiver present to determine baseline cognitive functioning Area of Impairment: Memory;Following commands;Problem solving;Safety/judgement;Awareness                     Memory: Decreased short-term memory Following Commands: Follows one step commands with increased time Safety/Judgement: Decreased awareness of safety;Decreased awareness of deficits Awareness: Intellectual Problem Solving: Slow processing;Decreased initiation;Difficulty sequencing;Requires verbal cues;Requires tactile cues General Comments: Patient still very foggy.  Unsure of baseline and if this is normal.  Poor safety awareness and problem solving skills.  Needs max verbal cueing for sequencing and safety.        Exercises     Shoulder Instructions       General Comments      Pertinent Vitals/ Pain       Pain Assessment: No/denies pain  Home Living                                           Prior Functioning/Environment              Frequency  Min 2X/week        Progress Toward Goals  OT Goals(current goals can now be found in the care plan section)  Progress towards OT goals: Progressing toward goals  Acute Rehab OT Goals Patient Stated Goal: To wash up OT Goal Formulation: With patient Time For Goal Achievement: 10/14/19 Potential to Achieve Goals: Good  Plan Discharge plan remains appropriate    Co-evaluation                 AM-PAC OT "6 Clicks" Daily Activity     Outcome Measure   Help from another person eating meals?: None Help from another person taking care of personal grooming?: A Little Help from another person toileting, which includes using toliet, bedpan, or urinal?: A Little Help from another person bathing (including washing, rinsing, drying)?: A Lot Help from another person to put on and taking off regular upper body clothing?: A Little Help from another person to put on and taking off regular lower body clothing?: A Lot 6 Click Score: 17    End of Session    OT Visit Diagnosis: Unsteadiness on feet (R26.81);Muscle weakness (generalized) (M62.81);Other symptoms and signs involving cognitive function   Activity Tolerance Patient tolerated treatment well   Patient Left in bed;with bed alarm set;with call bell/phone within reach   Nurse Communication Mobility status        Time: 0076-2263 OT Time Calculation (min): 26 min  Charges: OT General Charges $OT Visit: 1 Visit OT Treatments $Self Care/Home Management : 23-37 mins  August Luz, OTR/L    Phylliss Bob 10/04/2019, 11:29 AM

## 2019-10-04 NOTE — Progress Notes (Signed)
Spoke with pt RN. Informed her that we would like to do this pt procedure this afternoon after 3p if IR schedule allows. Informed her of new orders from radiology PA; NPO now and hold lovenox. She verbalized understanding and was appreciative of call.

## 2019-10-04 NOTE — Progress Notes (Signed)
Physical Therapy Treatment Patient Details Name: [name] MRN: [id number] DOB: 1956-03-03 Today's Date: 10/04/2019    History of Present Illness [name] is a 64 y.o. male with medical history significant of BPH with obstruction, prostate cancer on active surveillance, hypertension, hyperlipidemia, diabetes mellitus, GERD presented to emergency department with generalized weakness, nausea and vomiting for about 1 week duration.Pt dx with COVID 19 with acute hypoxic respiratory failure, AKI on CKD stage III, and hyponatremia.      PT Comments    Patient continues to move slowly, in part due to lack of his Rt AFO for foot drop and knee support. His vital signs were stable on room air while walking 100 Ft with min-guard to min assist. Initiated exercises he can do between sessions. Will benefit from an exercise handout.     Follow Up Recommendations  Home health PT     Equipment Recommendations  Rolling walker with 5" wheels    Recommendations for Other Services       Precautions / Restrictions Precautions Precautions: Fall Precaution Comments: R foot drop, h/o falls Required Braces or Orthoses: Other Brace Other Brace: reports he has AFO     Mobility  Bed Mobility Overal bed mobility: Modified Independent             General bed mobility comments: sitting EOB on arrival  Transfers Overall transfer level: Needs assistance Equipment used: None Transfers: Sit to/from Stand Sit to Stand: Min guard         General transfer comment: Min guard assist for safety with lines/tubes and balance    Ambulation/Gait Ambulation/Gait assistance: Min guard Gait Distance (Feet): 100 Feet Assistive device: IV Pole;None Gait Pattern/deviations: Step-through pattern;Decreased dorsiflexion - right;Shuffle Gait velocity: decr   General Gait Details: No AFO found in room. Pt reaching for handrail in hallway and offered to hold onto IV pole. Assisted pt with steering IV pole.  Pt dragging/sliding rt foot on floor   Stairs             Wheelchair Mobility    Modified Rankin (Stroke Patients Only)       Balance Overall balance assessment: Needs assistance Sitting-balance support: Feet supported;No upper extremity supported Sitting balance-Leahy Scale: Fair     Standing balance support: No upper extremity supported Standing balance-Leahy Scale: Fair Standing balance comment: needs external support for dynamic standing activities                            Cognition Arousal/Alertness: Awake/alert Behavior During Therapy: Flat affect Overall Cognitive Status: No family/caregiver present to determine baseline cognitive functioning Area of Impairment: Following commands;Problem solving                     Memory: Decreased short-term memory Following Commands: Follows one step commands with increased time Safety/Judgement: Decreased awareness of safety;Decreased awareness of deficits Awareness: Intellectual Problem Solving: Slow processing;Decreased initiation;Requires verbal cues General Comments: Patient still very foggy.  Unsure of baseline       Exercises General Exercises - Lower Extremity Ankle Circles/Pumps: AROM;Both;5 reps Long Arc Quad: AROM;Both;5 reps Hip Flexion/Marching: AROM;Both;5 reps;Seated Other Exercises Other Exercises: sit to stand with slow descent x 5 reps    General Comments General comments (skin integrity, edema, etc.): pt to go for either ureter stent or drain this pm      Pertinent Vitals/Pain Pain Assessment: Faces Faces Pain Scale: Hurts little more Pain Location: Chronic  back pain Pain Descriptors / Indicators: Aching;Discomfort Pain Intervention(s): Limited activity within patient's tolerance;Monitored during session    Home Living                      Prior Function            PT Goals (current goals can now be found in the care plan section) Acute Rehab PT  Goals Patient Stated Goal: keep his strength up Time For Goal Achievement: 10/14/19 Potential to Achieve Goals: Good Progress towards PT goals: Progressing toward goals    Frequency    Min 3X/week      PT Plan Current plan remains appropriate    Co-evaluation              AM-PAC PT "6 Clicks" Mobility   Outcome Measure  Help needed turning from your back to your side while in a flat bed without using bedrails?: None Help needed moving from lying on your back to sitting on the side of a flat bed without using bedrails?: None Help needed moving to and from a bed to a chair (including a wheelchair)?: A Little Help needed standing up from a chair using your arms (e.g., wheelchair or bedside chair)?: A Little Help needed to walk in hospital room?: A Little Help needed climbing 3-5 steps with a railing? : A Little 6 Click Score: 20    End of Session   Activity Tolerance: Patient limited by fatigue Patient left: with call bell/phone within reach;in chair;with nursing/sitter in room Nurse Communication: Mobility status PT Visit Diagnosis: Muscle weakness (generalized) (M62.81);Difficulty in walking, not elsewhere classified (R26.2)     Time: 0981-1914 PT Time Calculation (min) (ACUTE ONLY): 42 min  Charges:  $Gait Training: 8-22 mins $Therapeutic Exercise: 23-37 mins                      Arby Barrette, PT Pager [phone]    Rexanne Mano 10/04/2019, 1:53 PM

## 2019-10-04 NOTE — Consult Note (Signed)
Addendum: 7.19.21 after direct discussion with patient regarding nephrostomy tube placement the Patient declines procedure at this time. Patient states that he was in discussion with his primary urologist as an OP about possible stent placement. Patient would prefer to further evaluation other options that do not consist of an the use of an external bag. Patient made aware of possible risks associated with declining the procedure at this time. Team made aware.   Chief Complaint: Right sided hydronephrosis with decline in kidney function and right flank pain. Request is for Nephrostomy tube placement - right.  Referring Physician(s): Dr, Thera Flake  Supervising Physician: Daryll Brod  Patient Status: [facility] [address] - In-pt  History of Present Illness: [name] is a 65 y.o. male History of Prostate cancer s/p cystoscopy with right ureter reimplantment, right boari flap and right psoas hitch on 5.16.2018, chronic right hydronephrosis, BPH, HTN, HLD, DM. Presented to this facility with weakness, nausea, vomiting and RUQ pain x 1 week. Found to be COVID +  with acute hypoxic respiratory failure and AKI. CT abd pelvis from 7.17.21 shows moderate to severe right hydronephrosis with hydroureter extending from the bladder to the surgical anastomosis. Renogram from May 2021 shows 12% function of the right kidney. Team is requesting nephrostomy tube placement right sided   Past Medical History:  Diagnosis Date   Closed nondisplaced spiral fracture of shaft of right tibia    Complicated urinary tract infection 09/06/2016   Depression    Diabetes mellitus without complication (HCC)    GERD (gastroesophageal reflux disease)    Gout    right foot   Hemorrhoids    Hypertension    Hypertension    Macular degeneration    Microcytic anemia    Prostate cancer ([place]) 08/2009   Rectal bleeding 07/31/2016   Tubular adenoma     Past Surgical History:  Procedure Laterality Date    CYSTOSCOPY WITH RETROGRADE PYELOGRAM, URETEROSCOPY AND STENT PLACEMENT Right 07/31/2016   Procedure: CYSTOSCOPY WITH RIGHT  RETROGRADE PYELOGRAM, URETEROSCOPY;  Surgeon: [name], MD;  Location: [facility];  Service: Urology;  Laterality: Right;   INGUINAL HERNIA REPAIR     Left ankle joint fusion  1981   PROSTATE BIOPSY     x 2   URETERAL REIMPLANTION  07/31/2016   Procedure: URETERAL REIMPLANT, right boari flap, right psoas hitch;  Surgeon: [name], MD;  Location: [facility];  Service: Urology;;    Allergies: Patient has no known allergies.  Medications: Prior to Admission medications   Medication Sig Start Date End Date Taking? Authorizing Provider  acetaminophen (TYLENOL) 500 MG tablet Take 500 mg by mouth every 6 (six) hours as needed for mild pain or moderate pain.   Yes [provider]  aspirin EC 81 MG tablet Take 81 mg by mouth daily.   Yes [provider]  metoprolol tartrate (LOPRESSOR) 50 MG tablet TAKE 1 TABLET BY MOUTH EVERY DAY 04/13/19  Yes [name], [name]  Multiple Vitamin (MULTIVITAMIN) tablet Take 1 tablet by mouth daily.   Yes [provider]  MYRBETRIQ 25 MG TB24 tablet TAKE 1 TABLET BY MOUTH EVERY DAY 08/13/19  Yes [name], [name]  nystatin (NYSTATIN) powder Apply 1 application topically 3 (three) times daily. 08/26/19  Yes [name], [name]  olmesartan-hydrochlorothiazide (BENICAR HCT) 40-25 MG tablet TAKE 1 TABLET BY MOUTH EVERY DAY 07/12/19  Yes [name], [name]  RYBELSUS 14 MG TABS TAKE 1 TABLET BY MOUTH DAILY. [place] 09/23/19  Yes [name], [name]  sildenafil (REVATIO) 20 MG tablet TAKE 1 TABLET BY MOUTH EVERY DAY (PRIOR AUTHORIZATION DENIED) 09/09/19  Yes [name], [name]  TRADJENTA 5 MG TABS tablet TAKE 1 TABLET BY MOUTH EVERY DAY 09/23/19  Yes [name], [name]  atorvastatin (LIPITOR) 20 MG tablet TAKE 1 TABLET BY MOUTH EVERY DAY 09/30/19   [name], [name]  fluconazole (DIFLUCAN) 100 MG  tablet Take 1 tablet (100 mg total) by mouth daily. Take 1 tablet by mouth now repeat in 5 days Patient not taking: Reported on 09/28/2019 08/26/19   [name], [name]  guaiFENesin (MUCINEX) 600 MG 12 hr tablet Take 1 tablet (600 mg total) by mouth 2 (two) times daily. Patient not taking: Reported on 09/28/2019 03/25/19 03/24/20  [name], [name]  traMADol (ULTRAM) 50 MG tablet Take 1 tablet (50 mg total) by mouth every 6 (six) hours as needed for severe pain. Patient not taking: Reported on 09/28/2019 11/27/18   Dalia Heading, PA-C  traZODone (DESYREL) 50 MG tablet TAKE 1 TABLET BY MOUTH EVERYDAY AT BEDTIME Patient not taking: Reported on 09/28/2019 02/03/19   [name], [name]     Family History  Problem Relation Age of Onset   Lung cancer Mother        Deceased   Throat cancer Brother    Pancreatic cancer Father    Heart disease Father        Deceased   Lung cancer Maternal Uncle        nephew   Diabetes Other     Social History   Socioeconomic History   Marital status: Divorced    Spouse name: Not on file   Number of children: 0   Years of education: Not on file   Highest education level: Not on file  Occupational History   Occupation: retired  Tobacco Use   Smoking status: Never Smoker   Smokeless tobacco: Never Used  Scientific laboratory technician Use: Never used  Substance and Sexual Activity   Alcohol use: Yes    Comment: ocassionally   Drug use: No   Sexual activity: Yes  Other Topics Concern   Not on file  Social History Narrative   Not on file   Social Determinants of Health   Financial Resource Strain: Low Risk    Difficulty of Paying Living Expenses: Not very hard  Food Insecurity: No Food Insecurity   Worried About Charity fundraiser in the Last Year: Never true   [place] in the Last Year: Never true  Transportation Needs: No Transportation Needs   Lack of Transportation (Medical): No   Lack of Transportation (Non-Medical):  No  Physical Activity: Insufficiently Active   Days of Exercise per Week: 3 days   Minutes of Exercise per Session: 10 min  Stress: No Stress Concern Present   Feeling of Stress : Only a little  Social Connections:    Frequency of Communication with Friends and Family:    Frequency of Social Gatherings with Friends and Family:    Attends Religious Services:    Active Member of Clubs or Organizations:    Attends Archivist Meetings:    Marital Status:     Review of Systems: A 12 point ROS discussed and pertinent positives are indicated in the HPI above.  All other systems are negative.  Review of Systems  Constitutional: Negative for fever.  HENT: Negative for congestion.   Respiratory: Negative for cough and shortness of breath.  Cardiovascular: Negative for chest pain.  Gastrointestinal: Positive for abdominal pain ( right flank).  Neurological: Negative for headaches.  Psychiatric/Behavioral: Negative for agitation, behavioral problems, confusion and hallucinations.    Vital Signs: BP 121/90    Pulse 69    Temp 98.3 F (36.8 C) (Oral)    Resp 12    Ht 5\' 6"  (1.676 m)    Wt 176 lb 5.9 oz (80 kg)    SpO2 94%    BMI 28.47 kg/m   Physical Exam Vitals and nursing note reviewed.  Constitutional:      Appearance: He is well-developed.  HENT:     Head: Normocephalic.  Pulmonary:     Effort: Pulmonary effort is normal.  Musculoskeletal:        General: Normal range of motion.     Cervical back: Normal range of motion.  Skin:    General: Skin is dry.  Neurological:     Mental Status: He is alert and oriented to person, place, and time.     Imaging: CT ABDOMEN PELVIS WO CONTRAST  Result Date: 10/02/2019 CLINICAL DATA:  Right-sided abdominal pain. History of chronic right hydronephrosis. EXAM: CT ABDOMEN AND PELVIS WITHOUT CONTRAST TECHNIQUE: Multidetector CT imaging of the abdomen and pelvis was performed following the standard protocol without IV  contrast. COMPARISON:  None. FINDINGS: Lower chest: Bilateral right greater than left heterogeneous opacities, decreased from prior. Evaluation of the abdominal viscera somewhat limited by the lack of IV contrast. Hepatobiliary: No focal liver abnormality is seen. No gallstones, gallbladder wall thickening, or biliary dilatation. Pancreas: Unremarkable. No surrounding inflammatory changes. Spleen: Normal in size without focal abnormality. Adrenals/Urinary Tract: Adrenal glands are unremarkable. Asymmetric right renal atrophy. There is moderate/severe right hydronephrosis similar to prior. There is severe distal hydroureter extending from the bladder up to the surgical anastomosis at the psoas hitch, also similar to prior. Nonobstructing small calculus in the inferior right kidney. No left hydronephrosis or renal calculi. Multiple simple cysts within the left kidney. The urinary bladder is mildly thick-walled similar to prior. Stomach/Bowel: Stomach is within normal limits. Appendix appears normal. No evidence of bowel wall thickening, distention, or inflammatory changes. Stool is noted throughout the colon. There is retained enteric contrast within the colon. Vascular/Lymphatic: Mild aortic atherosclerosis without aneurysm. Vascular patency cannot be assessed in the absence of IV contrast. No lymphadenopathy. Reproductive: The prostate is enlarged. Other: Small bilateral fat containing inguinal hernias. Musculoskeletal: No acute or significant osseous findings. IMPRESSION: 1. No acute finding in the abdomen or pelvis on a noncontrast scan. 2. Chronic right renal atrophy with moderate/severe right hydronephrosis. There is severe distal hydroureter extending from the bladder up to the surgical anastomosis at the psoas hitch, also similar to prior. 3. Nonobstructing small calculus in the inferior right kidney. 4. Bilateral right greater than left pulmonary opacities in the lung bases, decreased from prior. 5. Enlarged  prostate. 6. Aortic atherosclerosis. Aortic Atherosclerosis (ICD10-I70.0). These results were discussed by telephone at the time of interpretation on 10/02/2019 at 6:50 pm to provider [facility]. Electronically Signed   By: Audie Pinto M.D.   On: 10/02/2019 18:57   CT ABDOMEN PELVIS WO CONTRAST  Result Date: 09/28/2019 CLINICAL DATA:  Flank pain, nausea, acute renal failure EXAM: CT ABDOMEN AND PELVIS WITHOUT CONTRAST TECHNIQUE: Multidetector CT imaging of the abdomen and pelvis was performed following the standard protocol without IV contrast. COMPARISON:  09/30/2016 FINDINGS: Lower chest: There are asymmetric areas of peripheral ground-glass infiltrate and pulmonary consolidation within the  visualized lung bases most in keeping with changes of atypical infection and suggestive of acute COVID-19 pneumonia. No pleural effusion. Moderate to severe coronary artery calcification. Global cardiac size within normal limits. Hepatobiliary: Liver and gallbladder are unremarkable. Pancreas: Unremarkable. Spleen: Unremarkable. Adrenals/Urinary Tract: The adrenal glands are unremarkable. The kidneys are normal in position. Surgical changes of right psoas hitch are identified. Since the prior examination, there has developed marked asymmetric right renal cortical atrophy. Moderate to severe right hydronephrosis and hydroureter is seen extending to the level of the ureteral anastomosis within the right retroperitoneum. 3 mm nonobstructing calculus is seen within the lower pole of the right kidney. Multiple simple cortical cysts are for identified within the left kidney. No renal or ureteral calculi on the left. No hydronephrosis on the left. The bladder is mildly thick wall suggesting changes of chronic bladder outlet obstruction. Stomach/Bowel: The large and small bowel are unremarkable. Appendix normal. The stomach is unremarkable. No free intraperitoneal gas or fluid. Vascular/Lymphatic: There is no pathologic  abdominal or pelvic lymphadenopathy. The abdominal vasculature is age-appropriate on this noncontrast examination. Reproductive: The prostate gland is moderate to severely enlarged with indentation of the base of the bladder. Other: Small bilateral fat containing inguinal hernias are present. The rectum is unremarkable. Musculoskeletal: No acute bone abnormality. IMPRESSION: Bibasilar pulmonary infiltrates most in keeping with atypical infection and most suggestive of acute COVID-19 pneumonia. Surgical changes of right psoas hitch with apparent obstruction at the surgical anastomosis and moderate to severe right hydronephrosis. This is likely long-standing with interval development of a severe right renal cortical atrophy. Moderate to severe prosthetic enlargement with indentation of the bladder base. Chronic bladder outlet obstruction suspected with mild circumferential bladder wall thickening noted. Minimal nonobstructing right nephrolithiasis. Electronically Signed   By: Fidela Salisbury MD   On: 09/28/2019 18:05   CT Head Wo Contrast  Result Date: 09/28/2019 CLINICAL DATA:  Altered mental status with nausea and vomiting EXAM: CT HEAD WITHOUT CONTRAST TECHNIQUE: Contiguous axial images were obtained from the base of the skull through the vertex without intravenous contrast. COMPARISON:  March 22, 2016 FINDINGS: Brain: Moderate diffuse atrophy is stable. Prominence of the cisterna magna is an anatomic variant. There is no intracranial mass, hemorrhage, extra-axial fluid collection, or midline shift. There is small vessel disease in the centra semiovale bilaterally, stable from prior study. No acute infarct evident. Vascular: No hyperdense vessel. There is calcification in each distal vertebral and carotid siphon region. Skull: Bony calvarium appears intact. Sinuses/Orbits: There is opacification in the left and to a lesser extent right sphenoid sinus regions. There is opacification in multiple ethmoid air  cells. There is also mucosal thickening in visualized maxillary antra, more on the left than the right. There is mucosal thickening in the inferior left frontal sinus. Orbits appear symmetric bilaterally. Other: Mastoid air cells are clear. IMPRESSION: Stable atrophy with periventricular small vessel disease. No acute infarct evident. No appreciable mass or hemorrhage. There are foci of arterial vascular calcification. There are multiple foci of paranasal sinus disease. Electronically Signed   By: Lowella Grip III M.D.   On: 09/28/2019 13:02   DG Chest Port 1 View  Result Date: 09/28/2019 CLINICAL DATA:  Weakness EXAM: PORTABLE CHEST 1 VIEW COMPARISON:  2018 FINDINGS: Low lung volumes. Patchy density at the right lung base. Mild chronic interstitial prominence. No pleural effusion or pneumothorax. Heart size is normal for portable technique. IMPRESSION: Right lung base patchy atelectasis/consolidation. Patchy density was also present on 2018 study therefore  scarring is also a consideration. Electronically Signed   By: Macy Mis M.D.   On: 09/28/2019 12:57   DG Abd Portable 1V  Result Date: 10/02/2019 CLINICAL DATA:  Abdominal pain, nausea and emesis EXAM: PORTABLE ABDOMEN - 1 VIEW COMPARISON:  CT 09/28/2019 FINDINGS: Paucity of mid abdominal bowel gas is nonspecific. Air and stool projects over the colon with some high density colonic material which could reflect some inspissation or ingestion of high attenuation material such as bismuth containing products. No evidence of free air though limited assessment on this supine only film. Patchy opacities again seen in the lung bases, partially included in the level of imaging. Telemetry leads overlie the abdomen. No acute or suspicious osseous abnormalities. Mild degenerative changes in the spine, hips and pelvis. IMPRESSION: 1. Paucity of mid abdominal bowel gas is nonspecific. No definitive high-grade obstructive pattern. 2. High density colonic  material which could reflect some inspissation or ingestion of high attenuation material such as bismuth containing products. Correlate for constipation. Electronically Signed   By: Lovena Le M.D.   On: 10/02/2019 17:36    Labs:  CBC: Recent Labs    10/01/19 0918 10/02/19 1910 10/03/19 0159 10/04/19 0426  WBC 11.1* 11.0* 11.5* 10.9*  HGB 18.2* 17.2* 16.8 16.6  HCT 56.1* 53.3* 51.8 50.6  PLT 334 287 299 271    COAGS: Recent Labs    09/28/19 1220 10/02/19 1910  INR 1.0 1.1    BMP: Recent Labs    10/02/19 0831 10/02/19 1910 10/03/19 0159 10/04/19 0426  NA 138 139 137 138  K 4.7 5.1 4.9 5.0  CL 106 109 103 104  CO2 19* 19* 22 25  GLUCOSE 155* 222* 145* 142*  BUN 50* 50* 49* 41*  CALCIUM 9.9 9.7 9.4 9.2  CREATININE 1.82* 1.71* 1.62* 1.75*  GFRNONAA 38* 41* 44* 40*  GFRAA 44* 48* 51* 46*    LIVER FUNCTION TESTS: Recent Labs    09/28/19 1220 09/29/19 0436 10/02/19 1910 10/04/19 0426  BILITOT 1.5* 0.6 0.6 0.9  AST 19 17 39 30  ALT 26 24 90* 77*  ALKPHOS 100 94 97 83  PROT 7.9 7.3 7.1 6.4*  ALBUMIN 3.3* 3.1* 3.2* 2.9*    Assessment and Plan:  64 y.o, male inpatient. History of Prostate cancer s/p cystoscopy with right ureter reimplantment, right boari flap and right psoas hitch on 5.16.2018, chronic right hydronephrosis, BPH, HTN, HLD, DM. Presented to this facility with weakness, nausea, vomiting and RUQ pain x 1 week. Found to be COVID +  with acute hypoxic respiratory failure and AKI. CT abd pelvis from 7.17.21 shows moderate to severe right hydronephrosis with hydroureter extending from the bladder to the surgical anastomosis. Renogram from May 2021 shows 12% function of the right kidney. Team is requesting nephrostomy tube placement right sided  WBC is 10.9, BUN 41, Cr 1.75 All labs and medications are within acceptable parameters.  Patient is afebrile.   Thank you for this interesting consult.  I greatly enjoyed meeting [name] and look forward  to participating in their care.  A copy of this report was sent to the requesting provider on this date.  Electronically Signed: Jacqualine Mau, NP 10/04/2019, 4:54 PM   I spent a total of 40 Minutes  in face to face in clinical consultation, greater than 50% of which was counseling/coordinating care for nephrostomy tube placement right sided

## 2019-10-04 NOTE — Progress Notes (Signed)
Progress Note    [name]  JOI:[id number] DOB: 03/20/1955  DOA: 09/28/2019 PCP: [name], [name]    Brief Narrative:    Medical records reviewed and are as summarized below:  [name] is a 64 y.o. male  with medical history significant of  BPH with obstruction, prostate cancer on active surveillance, hypertension, hyperlipidemia, diabetes mellitus, GERD presented to emergency department with generalized weakness, nausea and vomiting for about 1 week duration.   Assessment/Plan:   COVID-19 infection/pneumonia with acute hypoxemic respiratory failure: He has mild to moderate disease, got 1 dose of the Moderna than a vaccine few weeks ago, he will finish his steroids and Remdesivir course.  Advance activity.  Encouraged to sit up in chair use I-S and flutter valve for pulmonary toiletry.  Monitor inflammatory markers if stable likely discharge on 10/03/2019.    Recent Labs  Lab 09/28/19 1220 09/28/19 1432 09/28/19 1700 09/29/19 0436 09/30/19 0352 10/01/19 0519 10/02/19 0831 10/02/19 1933 10/03/19 0159 10/04/19 0426  CRP  --   --  4.3*   < > 2.5* 1.3* 0.7  --  0.7 0.6  DDIMER  --   --  0.89*   < > 0.97* 0.92* 1.08*  --  0.75* 0.58*  BNP  --   --  31.6  --   --   --  58.9  --   --  66.7  PROCALCITON  --   --  0.13  --   --   --   --  <0.10 <0.10 0.10  LATICACIDVEN  --  1.2  --   --   --   --   --  1.1  --   --   SARSCOV2NAA POSITIVE*  --   --   --   --   --   --   --   --   --    < > = values in this interval not displayed.       Sinus tachycardia: This is due to underlying infection.  Continue to monitor.  CKD stage IIIb with metabolic acidosis, hyperkalemia: No function now close to baseline creatinine of around 1.9-2, hyperkalemia has resolved outpatient follow-up with PCP and urology.  Prostate cancer, BPH with obstruction, moderate to severe right hydronephrosis, moderate to severe prostatic enlargement with indentation of bladder base: Now with right lower  abdominal pain and discomfort starting evening of 10/02/2019, patient states off and has been going on intermittently for several weeks.  Urology consulted who recommends right-sided nephrostomy tube, IR will place likely on 10/04/2019 afternoon.  Empiric Rocephin till then will be continued.  Continue pain control.  Constipation.  Placed on bowel regimen.  Hypertension.  Pain control, on Norvasc and beta-blocker added hydralazine also for better control.   Body mass index is 28.47 kg/m.  Diet Order            DIET SOFT Room service appropriate? Yes; Fluid consistency: Thin  Diet effective now                  Medications:   . amLODipine  10 mg Oral Daily  . vitamin C  500 mg Oral Daily  . aspirin EC  81 mg Oral Daily  . atorvastatin  20 mg Oral Daily  . bisacodyl  10 mg Rectal Daily  . docusate sodium  200 mg Oral BID  . enoxaparin (LOVENOX) injection  40 mg Subcutaneous Q24H  . hydrALAZINE  50 mg Oral Q8H  .  hydrocortisone  25 mg Rectal BID  . insulin aspart  0-15 Units Subcutaneous TID WC  . insulin aspart  0-5 Units Subcutaneous QHS  . metoprolol tartrate  50 mg Oral Daily  . pantoprazole (PROTONIX) IV  40 mg Intravenous Q12H  . polyethylene glycol  17 g Oral BID  . sodium bicarbonate  1,300 mg Oral TID  . zinc sulfate  220 mg Oral Daily   Continuous Infusions: . cefTRIAXone (ROCEPHIN)  IV Stopped (10/03/19 1815)  . lactated ringers 50 mL/hr at 10/04/19 0654     Anti-infectives (From admission, onward)   Start     Dose/Rate Route Frequency Ordered Stop   10/02/19 1800  cefTRIAXone (ROCEPHIN) 1 g in sodium chloride 0.9 % 100 mL IVPB     Discontinue     1 g 200 mL/hr over 30 Minutes Intravenous Every 24 hours 10/02/19 1732     09/29/19 1000  remdesivir 100 mg in sodium chloride 0.9 % 100 mL IVPB       "Followed by" Linked Group Details   100 mg 200 mL/hr over 30 Minutes Intravenous Daily 09/28/19 1624 10/02/19 1900   09/28/19 1630  remdesivir 200 mg in sodium  chloride 0.9% 250 mL IVPB       "Followed by" Linked Group Details   200 mg 580 mL/hr over 30 Minutes Intravenous Once 09/28/19 1624 09/28/19 1915   09/28/19 1500  cefTRIAXone (ROCEPHIN) 1 g in sodium chloride 0.9 % 100 mL IVPB  Status:  Discontinued        1 g 200 mL/hr over 30 Minutes Intravenous Every 24 hours 09/28/19 1448 09/29/19 1209       Family Communication/Anticipated D/C date and plan/Code Status   DVT prophylaxis: enoxaparin (LOVENOX) injection 40 mg Start: 09/28/19 1430 SCDs Start: 09/28/19 1417     Code Status: Full Code  Family Communication: Plan discussed with patient Disposition Plan:    Status is: Inpatient  Remains inpatient appropriate because:IV treatments appropriate due to intensity of illness or inability to take PO and Inpatient level of care appropriate due to severity of illness   Dispo: The patient is from: Home              Anticipated d/c is to: Home              Anticipated d/c date is: 3 days              Patient currently is not medically stable to d/c.  Consults.  Urology, IR   Subjective:   Patient in bed, appears comfortable, denies any headache, no fever, no chest pain or pressure, no shortness of breath , mild R. sided abdominal pain. No focal weakness.  Objective:     Vitals:   10/03/19 1342 10/03/19 2042 10/04/19 0610 10/04/19 0801  BP: 139/90 115/79 106/87 (!) 156/108  Pulse: 65 (!) 58 77 91  Resp: 20 20 13 16   Temp: (!) 97.5 F (36.4 C) 97.8 F (36.6 C) 98.1 F (36.7 C) 98.2 F (36.8 C)  TempSrc: Oral Oral Oral Oral  SpO2: 98% 96% 94% 96%  Weight:      Height:       No data found.   Intake/Output Summary (Last 24 hours) at 10/04/2019 0848 Last data filed at 10/04/2019 0744 Gross per 24 hour  Intake 1460 ml  Output 2825 ml  Net -1365 ml   Filed Weights   09/30/19 0409 10/01/19 0513 10/03/19 0517  Weight: 79.1 kg 80.6 kg 80  kg    Exam:  Awake Alert, No new F.N deficits, Normal  affect [place].AT,PERRAL Supple Neck,No JVD, No cervical lymphadenopathy appriciated.  Symmetrical Chest wall movement, Good air movement bilaterally, CTAB RRR,No Gallops, Rubs or new Murmurs, No Parasternal Heave +ve B.Sounds, Abd Soft, No tenderness, No organomegaly appriciated, No rebound - guarding or rigidity. No Cyanosis, Clubbing or edema, No new Rash or bruise   Data Reviewed:   I have personally reviewed following labs and imaging studies:  Labs: Labs show the following:   Basic Metabolic Panel: Recent Labs  Lab 09/28/19 1220 09/28/19 1432 09/29/19 0436 09/29/19 0436 09/30/19 0352 10/01/19 0519 10/01/19 0918 10/01/19 0918 10/02/19 0831 10/02/19 0831 10/02/19 1910 10/02/19 1910 10/03/19 0159 10/04/19 0426  NA   < >  --  139   < >  --   --  135  --  138  --  139  --  137 138  K   < >  --  5.0   < >  --   --  5.7*   < > 4.7   < > 5.1   < > 4.9 5.0  CL   < >  --  106   < >  --   --  108  --  106  --  109  --  103 104  CO2   < >  --  19*   < >  --   --  14*  --  19*  --  19*  --  22 25  GLUCOSE   < >  --  94   < >  --   --  263*  --  155*  --  222*  --  145* 142*  BUN   < >  --  48*   < >  --   --  46*  --  50*  --  50*  --  49* 41*  CREATININE   < > 2.08* 1.76*   < >  --   --  1.81*  --  1.82*  --  1.71*  --  1.62* 1.75*  CALCIUM   < >  --  9.0   < >  --   --  9.6  --  9.9  --  9.7  --  9.4 9.2  MG   < > 2.6* 2.4   < > 2.3 2.0  --   --  1.9  --   --   --  1.8 1.6*  PHOS  --  2.7 2.8  --  3.0 3.6  --   --   --   --   --   --   --   --    < > = values in this interval not displayed.   GFR Estimated Creatinine Clearance: 41.8 mL/min (A) (by C-G formula based on SCr of 1.75 mg/dL (H)). Liver Function Tests: Recent Labs  Lab 09/28/19 1220 09/29/19 0436 10/02/19 1910 10/04/19 0426  AST 19 17 39 30  ALT 26 24 90* 77*  ALKPHOS 100 94 97 83  BILITOT 1.5* 0.6 0.6 0.9  PROT 7.9 7.3 7.1 6.4*  ALBUMIN 3.3* 3.1* 3.2* 2.9*   Recent Labs  Lab 09/28/19 1432  10/02/19 1910  LIPASE 30 41   No results for input(s): AMMONIA in the last 168 hours. Coagulation profile Recent Labs  Lab 09/28/19 1220 10/02/19 1910  INR 1.0 1.1    CBC: Recent Labs  Lab 09/28/19 1220 09/28/19 1432  09/29/19 0436 10/01/19 0918 10/02/19 1910 10/03/19 0159 10/04/19 0426  WBC 7.5   < > 6.3 11.1* 11.0* 11.5* 10.9*  NEUTROABS 6.2  --   --  9.7*  --   --  8.1*  HGB 17.1*   < > 16.4 18.2* 17.2* 16.8 16.6  HCT 52.0   < > 50.1 56.1* 53.3* 51.8 50.6  MCV 83.3   < > 83.6 83.4 84.2 85.6 85.3  PLT 208   < > 205 334 287 299 271   < > = values in this interval not displayed.   Cardiac Enzymes: No results for input(s): CKTOTAL, CKMB, CKMBINDEX, TROPONINI in the last 168 hours. BNP (last 3 results) No results for input(s): PROBNP in the last 8760 hours. CBG: Recent Labs  Lab 10/03/19 0726 10/03/19 1140 10/03/19 1606 10/03/19 2043 10/04/19 0752  GLUCAP 159* 182* 228* 154* 146*   D-Dimer: Recent Labs    10/03/19 0159 10/04/19 0426  DDIMER 0.75* 0.58*   Hgb A1c: No results for input(s): HGBA1C in the last 72 hours. Lipid Profile: No results for input(s): CHOL, HDL, LDLCALC, TRIG, CHOLHDL, LDLDIRECT in the last 72 hours. Thyroid function studies: No results for input(s): TSH, T4TOTAL, T3FREE, THYROIDAB in the last 72 hours.  Invalid input(s): FREET3 Anemia work up: No results for input(s): VITAMINB12, FOLATE, FERRITIN, TIBC, IRON, RETICCTPCT in the last 72 hours. Sepsis Labs: Recent Labs  Lab 09/28/19 1432 09/28/19 1700 09/29/19 0436 10/01/19 0918 10/02/19 1910 10/02/19 1933 10/03/19 0159 10/04/19 0426  PROCALCITON  --  0.13  --   --   --  <0.10 <0.10 0.10  WBC 7.4  --    < > 11.1* 11.0*  --  11.5* 10.9*  LATICACIDVEN 1.2  --   --   --   --  1.1  --   --    < > = values in this interval not displayed.    Microbiology Recent Results (from the past 240 hour(s))  SARS Coronavirus 2 by RT PCR (hospital order, performed in [facility] hospital  lab) Nasopharyngeal Nasopharyngeal Swab     Status: Abnormal   Collection Time: 09/28/19 12:20 PM   Specimen: Nasopharyngeal Swab  Result Value Ref Range Status   SARS Coronavirus 2 POSITIVE (A) NEGATIVE Final    Comment: RESULT CALLED TO, READ BACK BY AND VERIFIED WITH: RN M. COCHRANE [id number] FCP (NOTE) SARS-CoV-2 target nucleic acids are DETECTED  SARS-CoV-2 RNA is generally detectable in upper respiratory specimens  during the acute phase of infection.  Positive results are indicative  of the presence of the identified virus, but do not rule out bacterial infection or co-infection with other pathogens not detected by the test.  Clinical correlation with patient history and  other diagnostic information is necessary to determine patient infection status.  The expected result is negative.  Fact Sheet for Patients:   StrictlyIdeas.no   Fact Sheet for Healthcare Providers:   BankingDealers.co.za    This test is not yet approved or cleared by the [place] FDA and  has been authorized for detection and/or diagnosis of SARS-CoV-2 by FDA under an Emergency Use Authorization (EUA).  This EUA will remain in effect (meaning this test c an be used) for the duration of  the COVID-19 declaration under Section 564(b)(1) of the Act, 21 U.S.C. section 360-bbb-3(b)(1), unless the authorization is terminated or revoked sooner.  Performed at [facility], [place] [address], [place], [place] [address]   Culture, Urine  Status: None   Collection Time: 09/28/19  4:25 PM   Specimen: Urine, Catheterized  Result Value Ref Range Status   Specimen Description URINE, CATHETERIZED  Final   Special Requests NONE  Final   Culture   Final    NO GROWTH Performed at [facility], [address], [place], [place] [address]    Report Status 09/30/2019 FINAL  Final  Culture, Urine     Status: Abnormal   Collection Time: 10/02/19  6:32 PM    Specimen: Urine, Random  Result Value Ref Range Status   Specimen Description URINE, RANDOM  Final   Special Requests NONE  Final   Culture (A)  Final    <10,000 COLONIES/mL INSIGNIFICANT GROWTH Performed at [facility], [place] [address], [place],  [address]    Report Status 10/03/2019 FINAL  Final    Procedures and diagnostic studies:  Signature  Lala Lund M.D on 10/04/2019 at 8:48 AM   -  To page go to [email or web address]

## 2019-10-04 NOTE — Plan of Care (Signed)
  Problem: Education: Goal: Ability to describe self-care measures that may prevent or decrease complications (Diabetes Survival Skills Education) will improve Outcome: Progressing Goal: Individualized Educational Video(s) Outcome: Progressing   Problem: Coping: Goal: Ability to adjust to condition or change in health will improve Outcome: Progressing   Problem: Fluid Volume: Goal: Ability to maintain a balanced intake and output will improve Outcome: Progressing   Problem: Health Behavior/Discharge Planning: Goal: Ability to identify and utilize available resources and services will improve Outcome: Progressing Goal: Ability to manage health-related needs will improve Outcome: Progressing   Problem: Metabolic: Goal: Ability to maintain appropriate glucose levels will improve Outcome: Progressing   Problem: Nutritional: Goal: Maintenance of adequate nutrition will improve Outcome: Progressing Goal: Progress toward achieving an optimal weight will improve Outcome: Progressing   Problem: Skin Integrity: Goal: Risk for impaired skin integrity will decrease Outcome: Progressing   Problem: Tissue Perfusion: Goal: Adequacy of tissue perfusion will improve Outcome: Progressing   Problem: Education: Goal: Knowledge of General Education information will improve Description: Including pain rating scale, medication(s)/side effects and non-pharmacologic comfort measures Outcome: Progressing   Problem: Health Behavior/Discharge Planning: Goal: Ability to manage health-related needs will improve Outcome: Progressing   Problem: Clinical Measurements: Goal: Ability to maintain clinical measurements within normal limits will improve Outcome: Progressing Goal: Will remain free from infection Outcome: Progressing Goal: Diagnostic test results will improve Outcome: Progressing Goal: Respiratory complications will improve Outcome: Progressing Goal: Cardiovascular complication will  be avoided Outcome: Progressing   Problem: Activity: Goal: Risk for activity intolerance will decrease Outcome: Progressing   Problem: Nutrition: Goal: Adequate nutrition will be maintained Outcome: Progressing   Problem: Coping: Goal: Level of anxiety will decrease Outcome: Progressing   Problem: Elimination: Goal: Will not experience complications related to bowel motility Outcome: Progressing Goal: Will not experience complications related to urinary retention Outcome: Progressing   Problem: Pain Managment: Goal: General experience of comfort will improve Outcome: Progressing   Problem: Safety: Goal: Ability to remain free from injury will improve Outcome: Progressing   Problem: Skin Integrity: Goal: Risk for impaired skin integrity will decrease Outcome: Progressing   Problem: Education: Goal: Knowledge of risk factors and measures for prevention of condition will improve Outcome: Progressing   Problem: Coping: Goal: Psychosocial and spiritual needs will be supported Outcome: Progressing   Problem: Respiratory: Goal: Will maintain a patent airway Outcome: Progressing Goal: Complications related to the disease process, condition or treatment will be avoided or minimized Outcome: Progressing   

## 2019-10-05 ENCOUNTER — Inpatient Hospital Stay (HOSPITAL_COMMUNITY): Payer: Medicare Other

## 2019-10-05 DIAGNOSIS — J1282 Pneumonia due to Coronavirus disease 2019: Secondary | ICD-10-CM | POA: Diagnosis not present

## 2019-10-05 DIAGNOSIS — U071 COVID-19: Secondary | ICD-10-CM | POA: Diagnosis not present

## 2019-10-05 LAB — CBC WITH DIFFERENTIAL/PLATELET
Abs Immature Granulocytes: 0.07 10*3/uL (ref 0.00–0.07)
Basophils Absolute: 0.1 10*3/uL (ref 0.0–0.1)
Basophils Relative: 0 %
Eosinophils Absolute: 0.2 10*3/uL (ref 0.0–0.5)
Eosinophils Relative: 2 %
HCT: 51.3 % (ref 39.0–52.0)
Hemoglobin: 16.6 g/dL (ref 13.0–17.0)
Immature Granulocytes: 1 %
Lymphocytes Relative: 12 %
Lymphs Abs: 1.4 10*3/uL (ref 0.7–4.0)
MCH: 27.9 pg (ref 26.0–34.0)
MCHC: 32.4 g/dL (ref 30.0–36.0)
MCV: 86.2 fL (ref 80.0–100.0)
Monocytes Absolute: 1 10*3/uL (ref 0.1–1.0)
Monocytes Relative: 9 %
Neutro Abs: 8.5 10*3/uL — ABNORMAL HIGH (ref 1.7–7.7)
Neutrophils Relative %: 76 %
Platelets: 276 10*3/uL (ref 150–400)
RBC: 5.95 MIL/uL — ABNORMAL HIGH (ref 4.22–5.81)
RDW: 13.5 % (ref 11.5–15.5)
WBC: 11.2 10*3/uL — ABNORMAL HIGH (ref 4.0–10.5)
nRBC: 0 % (ref 0.0–0.2)

## 2019-10-05 LAB — COMPREHENSIVE METABOLIC PANEL
ALT: 53 U/L — ABNORMAL HIGH (ref 0–44)
AST: 18 U/L (ref 15–41)
Albumin: 3 g/dL — ABNORMAL LOW (ref 3.5–5.0)
Alkaline Phosphatase: 86 U/L (ref 38–126)
Anion gap: 7 (ref 5–15)
BUN: 29 mg/dL — ABNORMAL HIGH (ref 8–23)
CO2: 30 mmol/L (ref 22–32)
Calcium: 9.1 mg/dL (ref 8.9–10.3)
Chloride: 99 mmol/L (ref 98–111)
Creatinine, Ser: 1.69 mg/dL — ABNORMAL HIGH (ref 0.61–1.24)
GFR calc Af Amer: 48 mL/min — ABNORMAL LOW (ref >60)
GFR calc non Af Amer: 42 mL/min — ABNORMAL LOW (ref >60)
Glucose, Bld: 195 mg/dL — ABNORMAL HIGH (ref 70–99)
Potassium: 4.6 mmol/L (ref 3.5–5.1)
Sodium: 136 mmol/L (ref 135–145)
Total Bilirubin: 0.8 mg/dL (ref 0.3–1.2)
Total Protein: 6.5 g/dL (ref 6.5–8.1)

## 2019-10-05 LAB — MAGNESIUM: Magnesium: 2 mg/dL (ref 1.7–2.4)

## 2019-10-05 LAB — D-DIMER, QUANTITATIVE: D-Dimer, Quant: 0.59 ug/mL-FEU — ABNORMAL HIGH (ref 0.00–0.50)

## 2019-10-05 LAB — BRAIN NATRIURETIC PEPTIDE: B Natriuretic Peptide: 39 pg/mL (ref 0.0–100.0)

## 2019-10-05 LAB — C-REACTIVE PROTEIN: CRP: 1.4 mg/dL — ABNORMAL HIGH (ref <1.0)

## 2019-10-05 LAB — GLUCOSE, CAPILLARY
Glucose-Capillary: 169 mg/dL — ABNORMAL HIGH (ref 70–99)
Glucose-Capillary: 189 mg/dL — ABNORMAL HIGH (ref 70–99)

## 2019-10-05 MED ORDER — CIPROFLOXACIN IN D5W 400 MG/200ML IV SOLN: 400.0000 mg | Freq: Once | INTRAVENOUS | Status: DC

## 2019-10-05 MED ORDER — MIDAZOLAM HCL 2 MG/2ML IJ SOLN
INTRAMUSCULAR | Status: AC
  Filled 2019-10-05: qty 2

## 2019-10-05 MED ORDER — FENTANYL CITRATE (PF) 100 MCG/2ML IJ SOLN
INTRAMUSCULAR | Status: AC
  Filled 2019-10-05: qty 2

## 2019-10-05 MED ORDER — ALUM & MAG HYDROXIDE-SIMETH 200-200-20 MG/5ML PO SUSP
30.0000 mL | Freq: Once | ORAL | Status: AC
  Administered 2019-10-05: 30 mL via ORAL
  Filled 2019-10-05: qty 30

## 2019-10-05 MED ORDER — ENOXAPARIN SODIUM 40 MG/0.4ML ~~LOC~~ SOLN: 40.0000 mg | SUBCUTANEOUS | Status: DC

## 2019-10-05 MED ORDER — TRAMADOL HCL 50 MG PO TABS: 50.0000 mg | ORAL_TABLET | Freq: Four times a day (QID) | ORAL | 0 refills | Status: DC | PRN

## 2019-10-05 NOTE — TOC Initial Note (Signed)
Transition of Care [facility]) - Initial/Assessment Note    Patient Details  Name: [name] MRN: [id number] Date of Birth: January 24, 1956  [facility] CM/SW Contact:    Curlene Labrum, RN Phone Number: 10/05/2019, 4:42 PM  Clinical Narrative:                 Case management spoke with the patient on the phone and his primary nurse for transitions plans for home - patient lives alone but is having a friend check on him at home provide transportation.  I offered the patient home health choice for PT and patient did not have a preference - Alvis Lemmings was called and the patient was set up with [facility] PT.  Discharged instructions were noted.  No other needs at this time.  I called the sister and she will followup with the patient as well for care needs.  Expected Discharge Plan: [place] Barriers to Discharge: No Barriers Identified   Patient Goals and CMS Choice Patient states their goals for this hospitalization and ongoing recovery are:: Plans to go home. CMS Medicare.gov Compare Post Acute Care list provided to:: Patient Choice offered to / list presented to : Patient  Expected Discharge Plan and Services Expected Discharge Plan: [place]   Discharge Planning Services: CM Consult Post Acute Care Choice: [place] arrangements for the past 2 months: Apartment Expected Discharge Date: 10/05/19                         [facility] Arranged: PT HH Agency: [place] Date [place]: 10/05/19 Time [place]: [id number] Representative spoke with at [place]: Tommi Rumps  Prior Living Arrangements/Services Living arrangements for the past 2 months: Apartment Lives with:: Self Patient language and need for interpreter reviewed:: Yes Do you feel safe going back to the place where you live?: Yes      Need for Family Participation in Patient Care: Yes (Comment) Care giver support system in place?: Yes  (comment) Current home services:  (has rolling walker at home.) Criminal Activity/Legal Involvement Pertinent to Current Situation/Hospitalization: No - Comment as needed  Activities of Daily Living Home Assistive Devices/Equipment: None ADL Screening (condition at time of admission) Patient's cognitive ability adequate to safely complete daily activities?: Yes Is the patient deaf or have difficulty hearing?: No Does the patient have difficulty seeing, even when wearing glasses/contacts?: No Does the patient have difficulty concentrating, remembering, or making decisions?: No Patient able to express need for assistance with ADLs?: Yes Does the patient have difficulty dressing or bathing?: No Independently performs ADLs?: Yes (appropriate for developmental age) Does the patient have difficulty walking or climbing stairs?: Yes Weakness of Legs: Both Weakness of Arms/Hands: None  Permission Sought/Granted Permission sought to share information with : Case Manager       Permission granted to share info w AGENCY: home health  Permission granted to share info w Relationship: sister     Emotional Assessment Appearance:: Appears older than stated age Attitude/Demeanor/Rapport: Gracious Affect (typically observed): Accepting Orientation: : Oriented to Self, Oriented to Place, Oriented to  Time, Oriented to Situation Alcohol / Substance Use: Not Applicable Psych Involvement: No (comment)  Admission diagnosis:  Acute on chronic kidney failure (HCC) [N17.9, N18.9] AKI (acute kidney injury) ([place]) [N17.9] Patient Active Problem List   Diagnosis Date Noted  . Pneumonia due to COVID-19 virus 09/29/2019  . Acute on chronic  kidney failure ([place]) 09/28/2019  . Hyponatremia 09/28/2019  . Sinus tachycardia 09/28/2019  . Insomnia 08/26/2018  . Nephropathy 03/26/2018  . Urinary urgency 03/26/2018  . Type 2 diabetes mellitus with diabetic nephropathy, without long-term current use of insulin  ([place]) 03/26/2018  . Essential hypertension 03/26/2018  . Chronic kidney disease (CKD), stage III (moderate) 03/26/2018  . Conductive hearing loss, bilateral 11/07/2017  . Uncontrolled type 2 diabetes mellitus with hyperglycemia ([place]) 07/31/2016  . Dyslipidemia associated with type 2 diabetes mellitus ([place]) 05/29/2016  . Microcytic anemia 02/17/2012  . 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 Benign hypertension 02/17/2012  . Depressive disorder 02/17/2012   PCP:  [name], [name] Pharmacy:   [id number] - [place], [place] [address] Phone: [phone] Fax: [phone]     Social Determinants of Health ([place]) Interventions    Readmission Risk Interventions Readmission Risk Prevention Plan 10/05/2019  Transportation Screening Complete  PCP or Specialist Appt within 5-7 Days Complete  Home Care Screening Complete  Medication Review (RN CM) Complete  Some recent data might be hidden

## 2019-10-05 NOTE — Progress Notes (Signed)
Progress Note    [name]  TWS:[id number] DOB: Jul 24, 1955  DOA: 09/28/2019 PCP: [name], [name]    Brief Narrative:    Medical records reviewed and are as summarized below:  [name] is a 64 y.o. male  with medical history significant of  BPH with obstruction, prostate cancer on active surveillance, hypertension, hyperlipidemia, diabetes mellitus, GERD presented to emergency department with generalized weakness, nausea and vomiting for about 1 week duration.   Assessment/Plan:   COVID-19 infection/pneumonia with acute hypoxemic respiratory failure: He has mild to moderate disease, got 1 dose of the Moderna than a vaccine few weeks ago, he will finish his steroids and Remdesivir course.  Advance activity.  Encouraged to sit up in chair use I-S and flutter valve for pulmonary toiletry.  Monitor inflammatory markers if stable likely discharge on 10/03/2019.    Recent Labs  Lab 09/28/19 1220 09/28/19 1432 09/28/19 1700 09/29/19 0436 10/01/19 0519 10/02/19 0831 10/02/19 1933 10/03/19 0159 10/04/19 0426 10/05/19 0209  CRP  --   --  4.3*   < > 1.3* 0.7  --  0.7 0.6 1.4*  DDIMER  --   --  0.89*   < > 0.92* 1.08*  --  0.75* 0.58* 0.59*  BNP  --   --  31.6  --   --  58.9  --   --  66.7 39.0  PROCALCITON  --   --  0.13  --   --   --  <0.10 <0.10 0.10  --   LATICACIDVEN  --  1.2  --   --   --   --  1.1  --   --   --   SARSCOV2NAA POSITIVE*  --   --   --   --   --   --   --   --   --    < > = values in this interval not displayed.       Sinus tachycardia: This is due to underlying infection.  Continue to monitor.  CKD stage IIIb with metabolic acidosis, hyperkalemia: No function now close to baseline creatinine of around 1.9-2, hyperkalemia has resolved outpatient follow-up with PCP and urology.  Prostate cancer, BPH with obstruction, moderate to severe right hydronephrosis, moderate to severe prostatic enlargement with indentation of bladder base: Now with right lower  abdominal pain and discomfort starting evening of 10/02/2019, patient states off and has been going on intermittently for several weeks.  Urology consulted who recommends right-sided nephrostomy tube, was scheduled for nephrostomy tube placement on 10/04/2019 but he refused the procedure, now has changed his mind again and will get it placed on 10/05/2019.  Have talked to IR myself..  Empiric Rocephin till then will be continued.  Continue pain control.  Constipation.  Placed on bowel regimen.  Hypertension.  Pain control, on Norvasc and beta-blocker added hydralazine also for better control.   Body mass index is 28.47 kg/m.  Diet Order            Diet NPO time specified  Diet effective now                  Medications:   . amLODipine  10 mg Oral Daily  . vitamin C  500 mg Oral Daily  . aspirin EC  81 mg Oral Daily  . atorvastatin  20 mg Oral Daily  . bisacodyl  10 mg Rectal Daily  . docusate sodium  200 mg Oral BID  .  enoxaparin (LOVENOX) injection  40 mg Subcutaneous Q24H  . hydrALAZINE  50 mg Oral Q8H  . insulin aspart  0-15 Units Subcutaneous TID WC  . insulin aspart  0-5 Units Subcutaneous QHS  . metoprolol tartrate  50 mg Oral Daily  . pantoprazole (PROTONIX) IV  40 mg Intravenous Q12H  . polyethylene glycol  17 g Oral BID  . sodium bicarbonate  1,300 mg Oral TID  . zinc sulfate  220 mg Oral Daily   Continuous Infusions: . cefTRIAXone (ROCEPHIN)  IV Stopped (10/04/19 2055)     Anti-infectives (From admission, onward)   Start     Dose/Rate Route Frequency Ordered Stop   10/02/19 1800  cefTRIAXone (ROCEPHIN) 1 g in sodium chloride 0.9 % 100 mL IVPB     Discontinue     1 g 200 mL/hr over 30 Minutes Intravenous Every 24 hours 10/02/19 1732     09/29/19 1000  remdesivir 100 mg in sodium chloride 0.9 % 100 mL IVPB       "Followed by" Linked Group Details   100 mg 200 mL/hr over 30 Minutes Intravenous Daily 09/28/19 1624 10/02/19 1900   09/28/19 1630  remdesivir 200 mg  in sodium chloride 0.9% 250 mL IVPB       "Followed by" Linked Group Details   200 mg 580 mL/hr over 30 Minutes Intravenous Once 09/28/19 1624 09/28/19 1915   09/28/19 1500  cefTRIAXone (ROCEPHIN) 1 g in sodium chloride 0.9 % 100 mL IVPB  Status:  Discontinued        1 g 200 mL/hr over 30 Minutes Intravenous Every 24 hours 09/28/19 1448 09/29/19 1209       Family Communication/Anticipated D/C date and plan/Code Status   DVT prophylaxis: enoxaparin (LOVENOX) injection 40 mg Start: 09/28/19 1430 SCDs Start: 09/28/19 1417     Code Status: Full Code  Family Communication: Plan discussed with patient Disposition Plan:    Status is: Inpatient  Remains inpatient appropriate because:IV treatments appropriate due to intensity of illness or inability to take PO and Inpatient level of care appropriate due to severity of illness   Dispo: The patient is from: Home              Anticipated d/c is to: Home              Anticipated d/c date is: 3 days              Patient currently is not medically stable to d/c.  Consults.  Urology, IR   Subjective:   Patient in bed, appears comfortable, denies any headache, no fever, no chest pain or pressure, no shortness of breath , is having right-sided abdominal pain and now wants to get the nephrostomy tube placed. No focal weakness.   Objective:     Vitals:   10/04/19 1600 10/04/19 1722 10/04/19 2015 10/05/19 0409  BP: 120/81 120/81 115/71 110/73  Pulse: 69 72 64 (!) 59  Resp: 19 11 20 14   Temp: 98.4 F (36.9 C)  97.9 F (36.6 C) 97.6 F (36.4 C)  TempSrc: Oral   Oral  SpO2: 95% 97% 99% 94%  Weight:      Height:       No data found.   Intake/Output Summary (Last 24 hours) at 10/05/2019 0948 Last data filed at 10/05/2019 0117 Gross per 24 hour  Intake 100 ml  Output 770 ml  Net -670 ml   Filed Weights   09/30/19 0409 10/01/19 0513 10/03/19 0517  Weight: 79.1 kg 80.6 kg 80 kg    Exam:  Awake Alert, No new F.N deficits,  Normal affect [place].AT,PERRAL Supple Neck,No JVD, No cervical lymphadenopathy appriciated.  Symmetrical Chest wall movement, Good air movement bilaterally, CTAB RRR,No Gallops, Rubs or new Murmurs, No Parasternal Heave +ve B.Sounds, Abd Soft, No tenderness, No organomegaly appriciated, No rebound - guarding or rigidity. No Cyanosis, Clubbing or edema, No new Rash or bruise   Data Reviewed:   I have personally reviewed following labs and imaging studies:  Labs: Labs show the following:   Basic Metabolic Panel: Recent Labs  Lab 09/28/19 1220 09/28/19 1432 09/29/19 0436 09/29/19 0436 09/30/19 0352 09/30/19 0352 10/01/19 0519 10/01/19 0918 10/02/19 0831 10/02/19 0831 10/02/19 1910 10/02/19 1910 10/03/19 0159 10/03/19 0159 10/04/19 0426 10/05/19 0209  NA   < >  --  139  --   --   --   --    < > 138  --  139  --  137  --  138 136  K   < >  --  5.0  --   --   --   --    < > 4.7   < > 5.1   < > 4.9   < > 5.0 4.6  CL   < >  --  106  --   --   --   --    < > 106  --  109  --  103  --  104 99  CO2   < >  --  19*  --   --   --   --    < > 19*  --  19*  --  22  --  25 30  GLUCOSE   < >  --  94  --   --   --   --    < > 155*  --  222*  --  145*  --  142* 195*  BUN   < >  --  48*  --   --   --   --    < > 50*  --  50*  --  49*  --  41* 29*  CREATININE   < > 2.08* 1.76*  --   --   --   --    < > 1.82*  --  1.71*  --  1.62*  --  1.75* 1.69*  CALCIUM   < >  --  9.0  --   --   --   --    < > 9.9  --  9.7  --  9.4  --  9.2 9.1  MG   < > 2.6* 2.4   < > 2.3   < > 2.0  --  1.9  --   --   --  1.8  --  1.6* 2.0  PHOS  --  2.7 2.8  --  3.0  --  3.6  --   --   --   --   --   --   --   --   --    < > = values in this interval not displayed.   GFR Estimated Creatinine Clearance: 43.3 mL/min (A) (by C-G formula based on SCr of 1.69 mg/dL (H)). Liver Function Tests: Recent Labs  Lab 09/28/19 1220 09/29/19 0436 10/02/19 1910 10/04/19 0426 10/05/19 0209  AST 19 17 39 30 18  ALT 26 24 90* 77*  53*  ALKPHOS 100  94 97 83 86  BILITOT 1.5* 0.6 0.6 0.9 0.8  PROT 7.9 7.3 7.1 6.4* 6.5  ALBUMIN 3.3* 3.1* 3.2* 2.9* 3.0*   Recent Labs  Lab 09/28/19 1432 10/02/19 1910  LIPASE 30 41   No results for input(s): AMMONIA in the last 168 hours. Coagulation profile Recent Labs  Lab 09/28/19 1220 10/02/19 1910  INR 1.0 1.1    CBC: Recent Labs  Lab 09/28/19 1220 09/28/19 1432 10/01/19 0918 10/02/19 1910 10/03/19 0159 10/04/19 0426 10/05/19 0209  WBC 7.5   < > 11.1* 11.0* 11.5* 10.9* 11.2*  NEUTROABS 6.2  --  9.7*  --   --  8.1* 8.5*  HGB 17.1*   < > 18.2* 17.2* 16.8 16.6 16.6  HCT 52.0   < > 56.1* 53.3* 51.8 50.6 51.3  MCV 83.3   < > 83.4 84.2 85.6 85.3 86.2  PLT 208   < > 334 287 299 271 276   < > = values in this interval not displayed.   Cardiac Enzymes: No results for input(s): CKTOTAL, CKMB, CKMBINDEX, TROPONINI in the last 168 hours. BNP (last 3 results) No results for input(s): PROBNP in the last 8760 hours. CBG: Recent Labs  Lab 10/04/19 0752 10/04/19 1227 10/04/19 1659 10/04/19 2034 10/05/19 0741  GLUCAP 146* 244* 133* 234* 169*   D-Dimer: Recent Labs    10/04/19 0426 10/05/19 0209  DDIMER 0.58* 0.59*   Hgb A1c: No results for input(s): HGBA1C in the last 72 hours. Lipid Profile: No results for input(s): CHOL, HDL, LDLCALC, TRIG, CHOLHDL, LDLDIRECT in the last 72 hours. Thyroid function studies: No results for input(s): TSH, T4TOTAL, T3FREE, THYROIDAB in the last 72 hours.  Invalid input(s): FREET3 Anemia work up: No results for input(s): VITAMINB12, FOLATE, FERRITIN, TIBC, IRON, RETICCTPCT in the last 72 hours. Sepsis Labs: Recent Labs  Lab 09/28/19 1432 09/28/19 1700 09/29/19 0436 10/02/19 1910 10/02/19 1933 10/03/19 0159 10/04/19 0426 10/05/19 0209  PROCALCITON  --  0.13  --   --  <0.10 <0.10 0.10  --   WBC 7.4  --    < > 11.0*  --  11.5* 10.9* 11.2*  LATICACIDVEN 1.2  --   --   --  1.1  --   --   --    < > = values in this  interval not displayed.    Microbiology Recent Results (from the past 240 hour(s))  SARS Coronavirus 2 by RT PCR (hospital order, performed in [facility] hospital lab) Nasopharyngeal Nasopharyngeal Swab     Status: Abnormal   Collection Time: 09/28/19 12:20 PM   Specimen: Nasopharyngeal Swab  Result Value Ref Range Status   SARS Coronavirus 2 POSITIVE (A) NEGATIVE Final    Comment: RESULT CALLED TO, READ BACK BY AND VERIFIED WITH: RN M. COCHRANE [id number] FCP (NOTE) SARS-CoV-2 target nucleic acids are DETECTED  SARS-CoV-2 RNA is generally detectable in upper respiratory specimens  during the acute phase of infection.  Positive results are indicative  of the presence of the identified virus, but do not rule out bacterial infection or co-infection with other pathogens not detected by the test.  Clinical correlation with patient history and  other diagnostic information is necessary to determine patient infection status.  The expected result is negative.  Fact Sheet for Patients:   StrictlyIdeas.no   Fact Sheet for Healthcare Providers:   BankingDealers.co.za    This test is not yet approved or cleared by the [place] FDA and  has been authorized  for detection and/or diagnosis of SARS-CoV-2 by FDA under an Emergency Use Authorization (EUA).  This EUA will remain in effect (meaning this test c an be used) for the duration of  the COVID-19 declaration under Section 564(b)(1) of the Act, 21 U.S.C. section 360-bbb-3(b)(1), unless the authorization is terminated or revoked sooner.  Performed at [facility], [place] [address] [place], [place] [address]   Culture, Urine     Status: None   Collection Time: 09/28/19  4:25 PM   Specimen: Urine, Catheterized  Result Value Ref Range Status   Specimen Description URINE, CATHETERIZED  Final   Special Requests NONE  Final   Culture   Final    NO GROWTH Performed at [facility], [address] [place], [place] [address]    Report Status 09/30/2019 FINAL  Final  Culture, Urine     Status: Abnormal   Collection Time: 10/02/19  6:32 PM   Specimen: Urine, Random  Result Value Ref Range Status   Specimen Description URINE, RANDOM  Final   Special Requests NONE  Final   Culture (A)  Final    <10,000 COLONIES/mL INSIGNIFICANT GROWTH Performed at [facility], [place] [address], [place], [place] [address]    Report Status 10/03/2019 FINAL  Final    Procedures and diagnostic studies:  Signature  Lala Lund M.D on 10/05/2019 at 9:48 AM   -  To page go to [email or web address]

## 2019-10-05 NOTE — Discharge Summary (Signed)
[name] OHY:[id number] DOB: Jul 11, 1955 DOA: 09/28/2019  PCP: [name], [name]  Admit date: 09/28/2019  Discharge date: 10/05/2019  Admitted From: Home   Disposition:  Home   Recommendations for Outpatient Follow-up:   Follow up with PCP in 1-2 weeks  PCP Please obtain BMP/CBC, 2 view CXR in 1week,  (see Discharge instructions)   PCP Please follow up on the following pending results:    Home Health: None   Equipment/Devices: None  Consultations: Urology, IR Discharge Condition: Stable    CODE STATUS: Full    Diet Recommendation: Heart Healthy Low Acrb    Chief Complaint  Patient presents with  . Emesis  . Nausea     Brief history of present illness from the day of admission and additional interim summary    [name] is a 64 y.o. male with medical history significant of BPH with obstruction, prostate cancer on active surveillance, hypertension, hyperlipidemia, diabetes mellitus, GERD presented to emergency department with generalized weakness, nausea and vomiting for about 1 week duration.                                                                 Hospital Course  COVID-19 infection/pneumonia with acute hypoxemic respiratory failure: He had mild to moderate disease, got 1 dose of the Moderna vaccine few weeks ago, he has finished his steroids and Remdesivir course.    Is completely symptom-free on room air and will be discharged home with PCP follow-up.    Recent Labs  Lab 09/28/19 1700 09/28/19 1700 09/29/19 0436 09/29/19 0436 09/30/19 0352 09/30/19 0352 10/01/19 0519 10/02/19 0831 10/02/19 1933 10/03/19 0159 10/04/19 0426 10/05/19 0209  CRP 4.3*   < > 3.9*   < > 2.5*   < > 1.3* 0.7  --  0.7 0.6 1.4*  DDIMER 0.89*   < > 1.18*   < > 0.97*   < > 0.92* 1.08*  --  0.75* 0.58*  0.59*  FERRITIN 240  --  254  --  257  --  285  --   --   --   --   --   BNP 31.6  --   --   --   --   --   --  58.9  --   --  66.7 39.0  PROCALCITON 0.13  --   --   --   --   --   --   --  <0.10 <0.10 0.10  --    < > = values in this interval not displayed.    Hepatic Function Latest Ref Rng & Units 10/05/2019 10/04/2019 10/02/2019  Total Protein 6.5 - 8.1 g/dL 6.5 6.4(L) 7.1  Albumin 3.5 - 5.0 g/dL 3.0(L) 2.9(L) 3.2(L)  AST 15 - 41 U/L 18 30 39  ALT 0 - 44 U/L 53(H) 77(H) 90(H)  Alk Phosphatase 38 - 126 U/L 86 83 97  Total Bilirubin 0.3 - 1.2 mg/dL 0.8 0.9 0.6    Prostate cancer, BPH with obstruction, moderate to severe right hydronephrosis, moderate to severe prostatic enlargement with indentation of bladder base: Now with right lower abdominal pain and discomfort starting evening of 10/02/2019, patient states off and has been going on intermittently for several weeks.    [facility] IR were consulted and he was scheduled for nephrostomy tube placement by IR placed unfortunately both times patient refused the procedure at the last moment.  I discussed his case with his urologist Dr. Headache again who said that patient has a longstanding history of changing his mind and the last time and not cooperating with recommendations.  He recommended that he discharged the patient home on some pain medications and have him follow with urology in a week in the office.  My suspicion is patient will be back with pain to the hospital pretty soon.  I have warned the patient that this can result in disability, infection and death, he has [facility] all responsibility for his decision.  Constipation.  Placed on bowel regimen and now better..  Hypertension.    Continue home regimen.   Discharge diagnosis     Principal Problem:   Pneumonia due to COVID-19 virus Active Problems:   Dyslipidemia associated with type 2 diabetes mellitus (HCC)   Type 2 diabetes mellitus with diabetic nephropathy, without long-term  current use of insulin (HCC)   Essential hypertension   Acute on chronic kidney failure (HCC)   Hyponatremia   Sinus tachycardia    Discharge instructions    Discharge Instructions    Discharge instructions   Complete by: As directed    Follow with Primary MD [name], [name] and your Urologist in 7 days   Get CBC, CMP  checked next visit within 1 week by Primary MD    Activity: As tolerated with Full fall precautions use walker/cane & assistance as needed  Disposition Home   Diet: Heart Healthy  Low Carb  Special Instructions: If you have smoked or chewed Tobacco  in the last 2 yrs please stop smoking, stop any regular Alcohol  and or any Recreational drug use.  On your next visit with your primary care physician please Get Medicines reviewed and adjusted.  Please request your Prim.MD to go over all Hospital Tests and Procedure/Radiological results at the follow up, please get all Hospital records sent to your Prim MD by signing hospital release before you go home.  If you experience worsening of your admission symptoms, develop shortness of breath, life threatening emergency, suicidal or homicidal thoughts you must seek medical attention immediately by calling 911 or calling your MD immediately  if symptoms less severe.  You Must read complete instructions/literature along with all the possible adverse reactions/side effects for all the Medicines you take and that have been prescribed to you. Take any new Medicines after you have completely understood and accpet all the possible adverse reactions/side effects.   Increase activity slowly   Complete by: As directed    MyChart COVID-19 home monitoring program   Complete by: Oct 05, 2019    Is the patient willing to use the [facility] for home monitoring?: Yes   Temperature monitoring   Complete by: Oct 05, 2019    After how many days would you like to receive a notification of this patient's flowsheet entries?: 1  Discharge Medications   Allergies as of 10/05/2019   No Known Allergies     Medication List    STOP taking these medications   olmesartan-hydrochlorothiazide 40-25 MG tablet Commonly known as: BENICAR HCT     TAKE these medications   acetaminophen 500 MG tablet Commonly known as: TYLENOL Take 500 mg by mouth every 6 (six) hours as needed for mild pain or moderate pain.   aspirin EC 81 MG tablet Take 81 mg by mouth daily.   atorvastatin 20 MG tablet Commonly known as: LIPITOR TAKE 1 TABLET BY MOUTH EVERY DAY   fluconazole 100 MG tablet Commonly known as: Diflucan Take 1 tablet (100 mg total) by mouth daily. Take 1 tablet by mouth now repeat in 5 days   guaiFENesin 600 MG 12 hr tablet Commonly known as: Mucinex Take 1 tablet (600 mg total) by mouth 2 (two) times daily.   metoprolol tartrate 50 MG tablet Commonly known as: LOPRESSOR TAKE 1 TABLET BY MOUTH EVERY DAY   multivitamin tablet Take 1 tablet by mouth daily.   Myrbetriq 25 MG Tb24 tablet Generic drug: mirabegron ER TAKE 1 TABLET BY MOUTH EVERY DAY   nystatin powder Commonly known as: nystatin Apply 1 application topically 3 (three) times daily.   Rybelsus 14 MG Tabs Generic drug: Semaglutide TAKE 1 TABLET BY MOUTH DAILY. 30 MINUTES BEFORE BREAKFAST   sildenafil 20 MG tablet Commonly known as: REVATIO TAKE 1 TABLET BY MOUTH EVERY DAY (PRIOR AUTHORIZATION DENIED)   Tradjenta 5 MG Tabs tablet Generic drug: linagliptin TAKE 1 TABLET BY MOUTH EVERY DAY   traMADol 50 MG tablet Commonly known as: ULTRAM Take 1 tablet (50 mg total) by mouth every 6 (six) hours as needed for severe pain.   traZODone 50 MG tablet Commonly known as: DESYREL TAKE 1 TABLET BY MOUTH EVERYDAY AT BEDTIME        Follow-up Information    [name], [name]. Schedule an appointment as soon as possible for a visit in 1 week(s).   Specialty: General Practice Contact information: [address] [place] [address] [phone]        [name], MD. Schedule an appointment as soon as possible for a visit in 5 day(s).   Specialty: Urology Contact information: [place] [address] [phone]               Major procedures and Radiology Reports - PLEASE review detailed and final reports thoroughly  -       CT ABDOMEN PELVIS WO CONTRAST  Result Date: 10/02/2019 CLINICAL DATA:  Right-sided abdominal pain. History of chronic right hydronephrosis. EXAM: CT ABDOMEN AND PELVIS WITHOUT CONTRAST TECHNIQUE: Multidetector CT imaging of the abdomen and pelvis was performed following the standard protocol without IV contrast. COMPARISON:  None. FINDINGS: Lower chest: Bilateral right greater than left heterogeneous opacities, decreased from prior. Evaluation of the abdominal viscera somewhat limited by the lack of IV contrast. Hepatobiliary: No focal liver abnormality is seen. No gallstones, gallbladder wall thickening, or biliary dilatation. Pancreas: Unremarkable. No surrounding inflammatory changes. Spleen: Normal in size without focal abnormality. Adrenals/Urinary Tract: Adrenal glands are unremarkable. Asymmetric right renal atrophy. There is moderate/severe right hydronephrosis similar to prior. There is severe distal hydroureter extending from the bladder up to the surgical anastomosis at the psoas hitch, also similar to prior. Nonobstructing small calculus in the inferior right kidney. No left hydronephrosis or renal calculi. Multiple simple cysts within the left kidney. The urinary  bladder is mildly thick-walled similar to prior. Stomach/Bowel: Stomach is within normal limits. Appendix appears normal. No evidence of bowel wall thickening, distention, or inflammatory changes. Stool is noted throughout the colon. There is retained enteric contrast within the colon. Vascular/Lymphatic: Mild aortic atherosclerosis without aneurysm. Vascular patency cannot be assessed in the  absence of IV contrast. No lymphadenopathy. Reproductive: The prostate is enlarged. Other: Small bilateral fat containing inguinal hernias. Musculoskeletal: No acute or significant osseous findings. IMPRESSION: 1. No acute finding in the abdomen or pelvis on a noncontrast scan. 2. Chronic right renal atrophy with moderate/severe right hydronephrosis. There is severe distal hydroureter extending from the bladder up to the surgical anastomosis at the psoas hitch, also similar to prior. 3. Nonobstructing small calculus in the inferior right kidney. 4. Bilateral right greater than left pulmonary opacities in the lung bases, decreased from prior. 5. Enlarged prostate. 6. Aortic atherosclerosis. Aortic Atherosclerosis (ICD10-I70.0). These results were discussed by telephone at the time of interpretation on 10/02/2019 at 6:50 pm to provider [facility]. Electronically Signed   By: Audie Pinto M.D.   On: 10/02/2019 18:57   CT ABDOMEN PELVIS WO CONTRAST  Result Date: 09/28/2019 CLINICAL DATA:  Flank pain, nausea, acute renal failure EXAM: CT ABDOMEN AND PELVIS WITHOUT CONTRAST TECHNIQUE: Multidetector CT imaging of the abdomen and pelvis was performed following the standard protocol without IV contrast. COMPARISON:  09/30/2016 FINDINGS: Lower chest: There are asymmetric areas of peripheral ground-glass infiltrate and pulmonary consolidation within the visualized lung bases most in keeping with changes of atypical infection and suggestive of acute COVID-19 pneumonia. No pleural effusion. Moderate to severe coronary artery calcification. Global cardiac size within normal limits. Hepatobiliary: Liver and gallbladder are unremarkable. Pancreas: Unremarkable. Spleen: Unremarkable. Adrenals/Urinary Tract: The adrenal glands are unremarkable. The kidneys are normal in position. Surgical changes of right psoas hitch are identified. Since the prior examination, there has developed marked asymmetric right renal cortical  atrophy. Moderate to severe right hydronephrosis and hydroureter is seen extending to the level of the ureteral anastomosis within the right retroperitoneum. 3 mm nonobstructing calculus is seen within the lower pole of the right kidney. Multiple simple cortical cysts are for identified within the left kidney. No renal or ureteral calculi on the left. No hydronephrosis on the left. The bladder is mildly thick wall suggesting changes of chronic bladder outlet obstruction. Stomach/Bowel: The large and small bowel are unremarkable. Appendix normal. The stomach is unremarkable. No free intraperitoneal gas or fluid. Vascular/Lymphatic: There is no pathologic abdominal or pelvic lymphadenopathy. The abdominal vasculature is age-appropriate on this noncontrast examination. Reproductive: The prostate gland is moderate to severely enlarged with indentation of the base of the bladder. Other: Small bilateral fat containing inguinal hernias are present. The rectum is unremarkable. Musculoskeletal: No acute bone abnormality. IMPRESSION: Bibasilar pulmonary infiltrates most in keeping with atypical infection and most suggestive of acute COVID-19 pneumonia. Surgical changes of right psoas hitch with apparent obstruction at the surgical anastomosis and moderate to severe right hydronephrosis. This is likely long-standing with interval development of a severe right renal cortical atrophy. Moderate to severe prosthetic enlargement with indentation of the bladder base. Chronic bladder outlet obstruction suspected with mild circumferential bladder wall thickening noted. Minimal nonobstructing right nephrolithiasis. Electronically Signed   By: Fidela Salisbury MD   On: 09/28/2019 18:05   CT Head Wo Contrast  Result Date: 09/28/2019 CLINICAL DATA:  Altered mental status with nausea and vomiting EXAM: CT HEAD WITHOUT CONTRAST TECHNIQUE: Contiguous axial images were obtained from  the base of the skull through the vertex without  intravenous contrast. COMPARISON:  March 22, 2016 FINDINGS: Brain: Moderate diffuse atrophy is stable. Prominence of the cisterna magna is an anatomic variant. There is no intracranial mass, hemorrhage, extra-axial fluid collection, or midline shift. There is small vessel disease in the centra semiovale bilaterally, stable from prior study. No acute infarct evident. Vascular: No hyperdense vessel. There is calcification in each distal vertebral and carotid siphon region. Skull: Bony calvarium appears intact. Sinuses/Orbits: There is opacification in the left and to a lesser extent right sphenoid sinus regions. There is opacification in multiple ethmoid air cells. There is also mucosal thickening in visualized maxillary antra, more on the left than the right. There is mucosal thickening in the inferior left frontal sinus. Orbits appear symmetric bilaterally. Other: Mastoid air cells are clear. IMPRESSION: Stable atrophy with periventricular small vessel disease. No acute infarct evident. No appreciable mass or hemorrhage. There are foci of arterial vascular calcification. There are multiple foci of paranasal sinus disease. Electronically Signed   By: Lowella Grip III M.D.   On: 09/28/2019 13:02   DG Chest Port 1 View  Result Date: 09/28/2019 CLINICAL DATA:  Weakness EXAM: PORTABLE CHEST 1 VIEW COMPARISON:  2018 FINDINGS: Low lung volumes. Patchy density at the right lung base. Mild chronic interstitial prominence. No pleural effusion or pneumothorax. Heart size is normal for portable technique. IMPRESSION: Right lung base patchy atelectasis/consolidation. Patchy density was also present on 2018 study therefore scarring is also a consideration. Electronically Signed   By: Macy Mis M.D.   On: 09/28/2019 12:57   DG Abd Portable 1V  Result Date: 10/02/2019 CLINICAL DATA:  Abdominal pain, nausea and emesis EXAM: PORTABLE ABDOMEN - 1 VIEW COMPARISON:  CT 09/28/2019 FINDINGS: Paucity of mid abdominal  bowel gas is nonspecific. Air and stool projects over the colon with some high density colonic material which could reflect some inspissation or ingestion of high attenuation material such as bismuth containing products. No evidence of free air though limited assessment on this supine only film. Patchy opacities again seen in the lung bases, partially included in the level of imaging. Telemetry leads overlie the abdomen. No acute or suspicious osseous abnormalities. Mild degenerative changes in the spine, hips and pelvis. IMPRESSION: 1. Paucity of mid abdominal bowel gas is nonspecific. No definitive high-grade obstructive pattern. 2. High density colonic material which could reflect some inspissation or ingestion of high attenuation material such as bismuth containing products. Correlate for constipation. Electronically Signed   By: Lovena Le M.D.   On: 10/02/2019 17:36    Micro Results     Recent Results (from the past 240 hour(s))  SARS Coronavirus 2 by RT PCR (hospital order, performed in [facility] hospital lab) Nasopharyngeal Nasopharyngeal Swab     Status: Abnormal   Collection Time: 09/28/19 12:20 PM   Specimen: Nasopharyngeal Swab  Result Value Ref Range Status   SARS Coronavirus 2 POSITIVE (A) NEGATIVE Final    Comment: RESULT CALLED TO, READ BACK BY AND VERIFIED WITH: RN M. COCHRANE [id number] FCP (NOTE) SARS-CoV-2 target nucleic acids are DETECTED  SARS-CoV-2 RNA is generally detectable in upper respiratory specimens  during the acute phase of infection.  Positive results are indicative  of the presence of the identified virus, but do not rule out bacterial infection or co-infection with other pathogens not detected by the test.  Clinical correlation with patient history and  other diagnostic information is necessary to determine patient infection status.  The expected result is negative.  Fact Sheet for Patients:   StrictlyIdeas.no   Fact Sheet  for Healthcare Providers:   BankingDealers.co.za    This test is not yet approved or cleared by the [place] FDA and  has been authorized for detection and/or diagnosis of SARS-CoV-2 by FDA under an Emergency Use Authorization (EUA).  This EUA will remain in effect (meaning this test c an be used) for the duration of  the COVID-19 declaration under Section 564(b)(1) of the Act, 21 U.S.C. section 360-bbb-3(b)(1), unless the authorization is terminated or revoked sooner.  Performed at [facility], [place] [address], [place], [place] [address]   Culture, Urine     Status: None   Collection Time: 09/28/19  4:25 PM   Specimen: Urine, Catheterized  Result Value Ref Range Status   Specimen Description URINE, CATHETERIZED  Final   Special Requests NONE  Final   Culture   Final    NO GROWTH Performed at [facility], [address] [place], [place] [address]    Report Status 09/30/2019 FINAL  Final  Culture, Urine     Status: Abnormal   Collection Time: 10/02/19  6:32 PM   Specimen: Urine, Random  Result Value Ref Range Status   Specimen Description URINE, RANDOM  Final   Special Requests NONE  Final   Culture (A)  Final    <10,000 COLONIES/mL INSIGNIFICANT GROWTH Performed at [facility], [place] [address] [place], [place] [address]    Report Status 10/03/2019 FINAL  Final    Today   Subjective    Lennin Osmond today has no headache,no chest pain, mild Abd pain, no new weakness tingling or numbness, feels much better wants to go home today.     Objective   Blood pressure 120/71, pulse 66, temperature 98 F (36.7 C), temperature source Oral, resp. rate 16, height _0  (1.676 m), weight 80 kg, SpO2 92%   Intake/Output Summary (Last 24 hours) at 10/05/2019 1507 Last data filed at 10/05/2019 1300 Gross per 24 hour  Intake 220 ml  Output 320 ml  Net -100 ml    Exam  Awake Alert, No new F.N deficits,   [place].AT,PERRAL Supple Neck,No  JVD, No cervical lymphadenopathy appriciated.  Symmetrical Chest wall movement, Good air movement bilaterally, CTAB RRR,No Gallops,Rubs or new Murmurs, No Parasternal Heave +ve B.Sounds, Abd Soft, Non tender, No organomegaly appriciated, No rebound -guarding or rigidity. No Cyanosis, Clubbing or edema, No new Rash or bruise   Data Review   CBC w Diff:  Lab Results  Component Value Date   WBC 11.2 (H) 10/05/2019   HGB 16.6 10/05/2019   HCT 51.3 10/05/2019   PLT 276 10/05/2019   LYMPHOPCT 12 10/05/2019   MONOPCT 9 10/05/2019   EOSPCT 2 10/05/2019   BASOPCT 0 10/05/2019    CMP:  Lab Results  Component Value Date   NA 136 10/05/2019   NA 138 08/26/2019   K 4.6 10/05/2019   CL 99 10/05/2019   CO2 30 10/05/2019   BUN 29 (H) 10/05/2019   BUN 35 (H) 08/26/2019   CREATININE 1.69 (H) 10/05/2019   GLU 159 08/22/2016   PROT 6.5 10/05/2019   PROT 7.4 08/26/2019   ALBUMIN 3.0 (L) 10/05/2019   ALBUMIN 4.3 08/26/2019   BILITOT 0.8 10/05/2019   BILITOT 0.4 08/26/2019   ALKPHOS 86 10/05/2019   AST 18 10/05/2019   ALT 53 (H) 10/05/2019  .   Total  Time in preparing paper work, data evaluation and todays exam - 29 minutes  Lala Lund M.D on 10/05/2019 at 3:07 PM  [facility]   Office  [phone]

## 2019-10-05 NOTE — Plan of Care (Signed)
  Problem: Clinical Measurements: Goal: Respiratory complications will improve Outcome: Progressing   

## 2019-10-05 NOTE — Discharge Instructions (Signed)
Follow with Primary MD [name], [name] and your Urologist in 7 days   Get CBC, CMP  checked next visit within 1 week by Primary MD    Activity: As tolerated with Full fall precautions use walker/cane & assistance as needed  Disposition Home   Diet: Heart Healthy  Low Carb  Special Instructions: If you have smoked or chewed Tobacco  in the last 2 yrs please stop smoking, stop any regular Alcohol  and or any Recreational drug use.  On your next visit with your primary care physician please Get Medicines reviewed and adjusted.  Please request your Prim.MD to go over all Hospital Tests and Procedure/Radiological results at the follow up, please get all Hospital records sent to your Prim MD by signing hospital release before you go home.  If you experience worsening of your admission symptoms, develop shortness of breath, life threatening emergency, suicidal or homicidal thoughts you must seek medical attention immediately by calling 911 or calling your MD immediately  if symptoms less severe.  You Must read complete instructions/literature along with all the possible adverse reactions/side effects for all the Medicines you take and that have been prescribed to you. Take any new Medicines after you have completely understood and accpet all the possible adverse reactions/side effects.

## 2019-10-05 NOTE — Progress Notes (Addendum)
Addendum 7.20.21. Patient refuses to sign the consent if he may have to have a nephrostomy tube with drainage bag. Attending at bedside during the discussion with the Patient.   After further evaluation and discussion with Team the Patient is now amenable to procedure.   IR consulted for possible Double JJ possible nephrostomy tube - right. Case has been reviewed and procedure approved by Dr. Vernard Gambles.  Patient tentatively scheduled for 7.20.21.  Team instructed to: Keep Patient to be NPO after midnight Hold prophylactic anticoagulation 24 hours prior to scheduled procedure.   IR will call patient when ready.

## 2019-10-06 ENCOUNTER — Telehealth: Payer: Self-pay

## 2019-10-06 NOTE — Telephone Encounter (Signed)
Transition Care Management Follow-up Telephone Call  Date of discharge and from where: [facility]  How have you been since you were released from the hospital? Feeling as bad  Any questions or concerns? Yes   Items Reviewed:  Did the pt receive and understand the discharge instructions provided? Yes   Medications obtained and verified? Yes   Any new allergies since your discharge? No   Dietary orders reviewed? Yes  Do you have support at home? No   Functional Questionnaire: (I = Independent and D = Dependent) ADLs: I  Bathing/Dressing- I  Meal Prep- I  Eating- I  Maintaining continence- I  Transferring/Ambulation- I  Managing Meds- I  Follow up appointments reviewed:   PCP Hospital f/u appt confirmed? Yes  Scheduled to see [name] on 10/07/2019 @ 10:30..  Are transportation arrangements needed? No   If their condition worsens, is the pt aware to call PCP or go to the Emergency Dept.? Yes  Was the patient provided with contact information for the PCP's office or ED? Yes  Was to pt encouraged to call back with questions or concerns? Yes

## 2019-10-07 ENCOUNTER — Telehealth (INDEPENDENT_AMBULATORY_CARE_PROVIDER_SITE_OTHER): Payer: Medicare Other | Admitting: Nurse Practitioner

## 2019-10-07 ENCOUNTER — Encounter: Payer: Self-pay | Admitting: Nurse Practitioner

## 2019-10-07 ENCOUNTER — Other Ambulatory Visit: Payer: Self-pay | Admitting: Nurse Practitioner

## 2019-10-07 ENCOUNTER — Other Ambulatory Visit: Payer: Self-pay

## 2019-10-07 DIAGNOSIS — R109 Unspecified abdominal pain: Secondary | ICD-10-CM | POA: Diagnosis not present

## 2019-10-07 DIAGNOSIS — N179 Acute kidney failure, unspecified: Secondary | ICD-10-CM | POA: Diagnosis not present

## 2019-10-07 DIAGNOSIS — I10 Essential (primary) hypertension: Secondary | ICD-10-CM | POA: Diagnosis not present

## 2019-10-07 DIAGNOSIS — E1121 Type 2 diabetes mellitus with diabetic nephropathy: Secondary | ICD-10-CM | POA: Diagnosis not present

## 2019-10-07 DIAGNOSIS — U071 COVID-19: Secondary | ICD-10-CM | POA: Diagnosis not present

## 2019-10-07 DIAGNOSIS — J1282 Pneumonia due to coronavirus disease 2019: Secondary | ICD-10-CM

## 2019-10-07 DIAGNOSIS — N1831 Chronic kidney disease, stage 3a: Secondary | ICD-10-CM

## 2019-10-07 MED ORDER — HYDROCODONE-ACETAMINOPHEN 5-325 MG PO TABS: 1.0000 | ORAL_TABLET | Freq: Four times a day (QID) | ORAL | 0 refills | Status: DC | PRN

## 2019-10-07 NOTE — Progress Notes (Signed)
Virtual Visit via Telephone   This visit type was conducted due to national recommendations for restrictions regarding the COVID-19 Pandemic (e.g. social distancing) in an effort to limit this patient's exposure and mitigate transmission in our community.  Due to his co-morbid illnesses, this patient is at least at moderate risk for complications without adequate follow up.  This format is felt to be most appropriate for this patient at this time.  All issues noted in this document were discussed and addressed.  A limited physical exam was performed with this format.    This visit type was conducted due to national recommendations for restrictions regarding the COVID-19 Pandemic (e.g. social distancing) in an effort to limit this patient's exposure and mitigate transmission in our community.  Patients identity confirmed using two different identifiers.  This format is felt to be most appropriate for this patient at this time.  All issues noted in this document were discussed and addressed.  No physical exam was performed (except for noted visual exam findings with Video Visits).    Date:  10/08/2019   ID:  MARCELLIUS MONTAGNA, DOB 06-Jan-1956, MRN [id number]  Patient Location:  Home - spoke with [name]  Provider location:   Office    Chief Complaint:  Hospital admission follow up for Covid Pneumonia  History of Present Illness:    VINOD MIKESELL is a 64 y.o. male who presents via video conferencing for a telehealth visit today.    The patient does not have symptoms concerning for COVID-19 infection (fever, chills, cough, or new shortness of breath).   Telephone visit for hospital admission on 7/13-7/20 after presenting to the emergency department with a one week history of generalized weakness, nausea and vomiting.  He was found to have Covid Pneumonia treated with steroids and remdesivir.  According to his discharge summary he had mild disease, he had his 1st Moderna vaccine about one week  prior to being sick.  He was also found to have a moderate to severe hydronephrosis in which Urology wanted to place a nephrostomy tube however the patient refused.  He states "I will call my Urologist to see what he wants to do because he has said there is not much they can do to fix my kidney, I only have 10% function." Per the hospital notes there was concern if he did not have the nephrostomy tube placed he would be back at the hospital.    Patient continues to have low back pain near his kidney area. He had been taking pain medications while in the hospital.    He does report he is tired and fatigued. He would like to have PT to come out and evaluate for strengthening.        Past Medical History:  Diagnosis Date  . Closed nondisplaced spiral fracture of shaft of right tibia   . Complicated urinary tract infection 09/06/2016  . Depression   . Diabetes mellitus without complication ([place])   . GERD (gastroesophageal reflux disease)   . Gout    right foot  . Hemorrhoids   . Hypertension   . Hypertension   . Macular degeneration   . Microcytic anemia   . Prostate cancer ([place]) 08/2009  . Rectal bleeding 07/31/2016  . Tubular adenoma    Past Surgical History:  Procedure Laterality Date  . CYSTOSCOPY WITH RETROGRADE PYELOGRAM, URETEROSCOPY AND STENT PLACEMENT Right 07/31/2016   Procedure: CYSTOSCOPY WITH RIGHT  RETROGRADE PYELOGRAM, URETEROSCOPY;  Surgeon: [name], MD;  Location: [facility];  Service: Urology;  Laterality: Right;  . INGUINAL HERNIA REPAIR    . Left ankle joint fusion  1981  . PROSTATE BIOPSY     x 2  . URETERAL REIMPLANTION  07/31/2016   Procedure: URETERAL REIMPLANT, right boari flap, right psoas hitch;  Surgeon: [name], MD;  Location: [facility];  Service: Urology;;     Current Meds  Medication Sig  . acetaminophen (TYLENOL) 500 MG tablet Take 500 mg by mouth every 6 (six) hours as needed for mild pain or moderate pain.  Marland Kitchen aspirin EC 81 MG tablet  Take 81 mg by mouth daily.  Marland Kitchen atorvastatin (LIPITOR) 20 MG tablet TAKE 1 TABLET BY MOUTH EVERY DAY  . fluconazole (DIFLUCAN) 100 MG tablet Take 1 tablet (100 mg total) by mouth daily. Take 1 tablet by mouth now repeat in 5 days  . guaiFENesin (MUCINEX) 600 MG 12 hr tablet Take 1 tablet (600 mg total) by mouth 2 (two) times daily.  . metoprolol tartrate (LOPRESSOR) 50 MG tablet TAKE 1 TABLET BY MOUTH EVERY DAY  . Multiple Vitamin (MULTIVITAMIN) tablet Take 1 tablet by mouth daily.  Marland Kitchen MYRBETRIQ 25 MG TB24 tablet TAKE 1 TABLET BY MOUTH EVERY DAY  . nystatin (NYSTATIN) powder Apply 1 application topically 3 (three) times daily.  . RYBELSUS 14 MG TABS TAKE 1 TABLET BY MOUTH DAILY. 30 MINUTES BEFORE BREAKFAST  . sildenafil (REVATIO) 20 MG tablet TAKE 1 TABLET BY MOUTH EVERY DAY (PRIOR AUTHORIZATION DENIED)  . TRADJENTA 5 MG TABS tablet TAKE 1 TABLET BY MOUTH EVERY DAY  . traMADol (ULTRAM) 50 MG tablet Take 1 tablet (50 mg total) by mouth every 6 (six) hours as needed for severe pain.  . traZODone (DESYREL) 50 MG tablet TAKE 1 TABLET BY MOUTH EVERYDAY AT BEDTIME     Allergies:   Patient has no known allergies.   Social History   Tobacco Use  . Smoking status: Never Smoker  . Smokeless tobacco: Never Used  Vaping Use  . Vaping Use: Never used  Substance Use Topics  . Alcohol use: Yes    Comment: ocassionally  . Drug use: No     Family Hx: The patient's family history includes Diabetes in an other family member; Heart disease in his father; Lung cancer in his maternal uncle and mother; Pancreatic cancer in his father; Throat cancer in his brother.  ROS:   Please see the history of present illness.    Review of Systems  Constitutional: Positive for malaise/fatigue.  Respiratory: Negative.   Cardiovascular: Negative.   Gastrointestinal: Negative for abdominal pain, diarrhea, nausea and vomiting.  Neurological: Negative for dizziness and headaches.  Psychiatric/Behavioral: Negative.       All other systems reviewed and are negative.   Labs/Other Tests and Data Reviewed:    Recent Labs: 09/28/2019: TSH 0.146 10/05/2019: ALT 53; B Natriuretic Peptide 39.0; BUN 29; Creatinine, Ser 1.69; Hemoglobin 16.6; Magnesium 2.0; Platelets 276; Potassium 4.6; Sodium 136   Recent Lipid Panel Lab Results  Component Value Date/Time   CHOL 187 08/26/2019 03:42 PM   TRIG 270 (H) 08/26/2019 03:42 PM   HDL 34 (L) 08/26/2019 03:42 PM   CHOLHDL 5.5 (H) 08/26/2019 03:42 PM   CHOLHDL 6.2 04/19/2008 04:30 AM   LDLCALC 106 (H) 08/26/2019 03:42 PM    Wt Readings from Last 3 Encounters:  10/03/19 176 lb 5.9 oz (80 kg)  08/26/19 190 lb 11.2 oz (86.5 kg)  08/26/19 190 lb 12.8 oz (  86.5 kg)     Exam:    Vital Signs:  There were no vitals taken for this visit.    Physical Exam Pulmonary:     Effort: No respiratory distress.  Skin:    Capillary Refill: Capillary refill takes less than 2 seconds.  Neurological:     General: No focal deficit present.     Mental Status: He is oriented to person, place, and time.  Psychiatric:        Mood and Affect: Mood normal.        Behavior: Behavior normal.        Thought Content: Thought content normal.        Judgment: Judgment normal.     ASSESSMENT & PLAN:     1. Pneumonia due to COVID-19 virus TCM Performed. A member of the clinical team spoke with the patient upon dischare. Discharge summary was reviewed in full detail during the visit. Meds reconciled and compared to discharge meds. Medication list is updated and reviewed with the patient.  Greater than 50% face to face time was spent in counseling an coordination of care.  All questions were answered to the satisfaction of the patient.   Treated with steroids and remdisivir He is feeling better.   Will order PT for strength and endurance Will have [facility] to contact patient to visit Will Recheck CXR on Aug 9th - Ambulatory referral to [facility] with Differential/Platelet;  Future  2. Type 2 diabetes mellitus with diabetic nephropathy, without long-term current use of insulin (HCC)  Chronic, patient reports is doing well  3. Essential hypertension  Chronic, no blood pressure this visit, this is a telephone visit  4. Right flank pain  Likely related to his hydronephrosis  I have advised him to contact his Urologist to see what the next steps are for him  I have provided a limited amount of norco  Reviewed PDMP - HYDROcodone-acetaminophen (NORCO) 5-325 MG tablet; Take 1 tablet by mouth every 6 (six) hours as needed for moderate pain.  Dispense: 30 tablet; Refill: 0  5. Acute renal failure superimposed on stage 3a chronic kidney disease, unspecified acute renal failure type ([place])  Will recheck kidney functions  He is encouraged to stay well hydrated with water - BMP8+eGFR; Future    COVID-19 Education: The signs and symptoms of COVID-19 were discussed with the patient and how to seek care for testing (follow up with PCP or arrange E-visit).  The importance of social distancing was discussed today.  Patient Risk:   After full review of this patients clinical status, I feel that they are at least moderate risk at this time.  Time:   Today, I have spent 20 minutes/ seconds with the patient with telehealth technology discussing above diagnoses.     Medication Adjustments/Labs and Tests Ordered: Current medicines are reviewed at length with the patient today.  Concerns regarding medicines are outlined above.   Tests Ordered: Orders Placed This Encounter  Procedures  . BMP8+eGFR  . CBC with Differential/Platelet  . Ambulatory referral to Home Health    Medication Changes: Meds ordered this encounter  Medications  . HYDROcodone-acetaminophen (NORCO) 5-325 MG tablet    Sig: Take 1 tablet by mouth every 6 (six) hours as needed for moderate pain.    Dispense:  30 tablet    Refill:  0    Disposition:  Follow up prn  I personally spent 30  minutes face-to-face and non-face-to-face in the care of this  patient, which includes all pre-, intra-, and post visit time on the date of service.  Signed, [name], [name]

## 2019-10-10 ENCOUNTER — Other Ambulatory Visit: Payer: Self-pay | Admitting: Nurse Practitioner

## 2019-10-10 DIAGNOSIS — N529 Male erectile dysfunction, unspecified: Secondary | ICD-10-CM

## 2019-10-11 ENCOUNTER — Encounter: Payer: Self-pay | Admitting: Gastroenterology

## 2019-10-11 ENCOUNTER — Telehealth: Payer: Self-pay

## 2019-10-11 ENCOUNTER — Ambulatory Visit: Payer: Medicare Other

## 2019-10-11 DIAGNOSIS — E1121 Type 2 diabetes mellitus with diabetic nephropathy: Secondary | ICD-10-CM

## 2019-10-11 DIAGNOSIS — N1832 Chronic kidney disease, stage 3b: Secondary | ICD-10-CM

## 2019-10-11 DIAGNOSIS — I10 Essential (primary) hypertension: Secondary | ICD-10-CM | POA: Diagnosis not present

## 2019-10-11 DIAGNOSIS — N1831 Chronic kidney disease, stage 3a: Secondary | ICD-10-CM | POA: Diagnosis not present

## 2019-10-11 NOTE — Telephone Encounter (Signed)
Can we call to see if we need to make a referral?

## 2019-10-11 NOTE — Chronic Care Management (AMB) (Signed)
Chronic Care Management    Social Work Follow Up Note  10/11/2019 Name: [name] MRN: [id number] DOB: February 02, 1956  [name] is a 64 y.o. year old male who is a primary care patient of [name], [place]. The CCM team was consulted for assistance with care coordination.   Review of patient status, including review of consultants reports, other relevant assessments, and collaboration with appropriate care team members and the patient's provider was performed as part of comprehensive patient evaluation and provision of chronic care management services.    SDOH (Social Determinants of Health) assessments performed: No    Outpatient Encounter Medications as of 10/11/2019  Medication Sig   acetaminophen (TYLENOL) 500 MG tablet Take 500 mg by mouth every 6 (six) hours as needed for mild pain or moderate pain.   aspirin EC 81 MG tablet Take 81 mg by mouth daily.   atorvastatin (LIPITOR) 20 MG tablet TAKE 1 TABLET BY MOUTH EVERY DAY   fluconazole (DIFLUCAN) 100 MG tablet Take 1 tablet (100 mg total) by mouth daily. Take 1 tablet by mouth now repeat in 5 days   guaiFENesin (MUCINEX) 600 MG 12 hr tablet Take 1 tablet (600 mg total) by mouth 2 (two) times daily.   HYDROcodone-acetaminophen (NORCO) 5-325 MG tablet Take 1 tablet by mouth every 6 (six) hours as needed for moderate pain.   metoprolol tartrate (LOPRESSOR) 50 MG tablet TAKE 1 TABLET BY MOUTH EVERY DAY   Multiple Vitamin (MULTIVITAMIN) tablet Take 1 tablet by mouth daily.   MYRBETRIQ 25 MG TB24 tablet TAKE 1 TABLET BY MOUTH EVERY DAY   nystatin (NYSTATIN) powder Apply 1 application topically 3 (three) times daily.   RYBELSUS 14 MG TABS TAKE 1 TABLET BY MOUTH DAILY. 30 MINUTES BEFORE BREAKFAST   sildenafil (REVATIO) 20 MG tablet TAKE 1 TABLET BY MOUTH EVERY DAY (PRIOR AUTHORIZATION DENIED)   TRADJENTA 5 MG TABS tablet TAKE 1 TABLET BY MOUTH EVERY DAY   traMADol (ULTRAM) 50 MG tablet Take 1 tablet (50 mg total) by mouth  every 6 (six) hours as needed for severe pain.   traZODone (DESYREL) 50 MG tablet TAKE 1 TABLET BY MOUTH EVERYDAY AT BEDTIME   No facility-administered encounter medications on file as of 10/11/2019.     Goals Addressed              This Visit's Progress     Patient Stated     "I need help getting into an assisted living facility" (pt-stated)        CARE PLAN ENTRY (see longitudinal plan of care for additional care plan information)  Current Barriers:   Knowledge Deficits related to process for admission into an ALF  Chronic Disease Management support and education needs related to CKD III, DM II, HTN  Nurse Case Manager Clinical Goal(s):   Over the next 90 days, patient will work with embedded BSW and PCP to address needs related to assistance for admission into an ALF  CCM SW Interventions: Completed 10/11/19  Collaboration with Glenna Durand, LPN who indicates patient would like a return call from [place] regarding goal progression  Performed chart review to note patient recently hospitalized 7/13 - 7/20 due to COVID Pneumonia and AKI  Patient participated in a virtaul office visit with [name], [name] on 7/22 for a post hospital follow up o PCP ordered home health and Remote Health Services  Successful outbound call placed to the patient  o The patient reports he has been in contact  with both home health and remote health with visits planned later in the week  Reviewed patients continued desire for ALF placement o The patient reports he is hoping to be placed in the next 1-2 months  Discussed process involved to locate a bed including physician paperwork and admission requirements such as TB skin testing and possible need for COVID 19 testing  Advised the patient SW has yet to receive completed Fl2 form from the patients provider due to the provider awaiting to complete until hospital follow up visit was complete  Collaboration with [name], [name] to request  update once Fl2 is completed o Advised the patient SW will fax out completed document to surrounding ALF's in [place] to locate a bed offer o Patient reports once a bed offer is made he would like his friend to tour the facility prior to accepting the bed offer  Scheduled follow up over the next 10 days to assess goal progression  CCM RN CM Interventions:   Inter-disciplinary care team collaboration (see longitudinal plan of care)  Determined patient would like to move into an ALF and would like assistance from the embedded BSW and PCP  Collaborated with embedded BSW [name] regarding patient's request for assistance with admission into an ALF  Discussed plans with patient for ongoing care management follow up and provided patient with direct contact information for care management team  Patient Self Care Activities:   Self administers medications as prescribed  Attends all scheduled provider appointments  Calls pharmacy for medication refills  Calls provider office for new concerns or questions  Please see past updates related to this goal by clicking on the "Past Updates" button in the selected goal          Follow Up Plan: SW will follow up with patient by phone over the next 10 days.   [name], BSW, CDP Social Worker, Certified Dementia Practitioner [place] [facility] [place] [facility] [phone]  Total time spent performing care coordination and/or care management activities with the patient by phone or face to face = 23 minutes.

## 2019-10-11 NOTE — Telephone Encounter (Signed)
Cindy from [facility] called stating they are unable to draw blood on patient due to him not being a [facility] paitent [phone] [facility]

## 2019-10-11 NOTE — Telephone Encounter (Signed)
She said she doesn't know she is unsure of how it works. YL,RMA

## 2019-10-11 NOTE — Patient Instructions (Signed)
Social Worker Visit Information  Goals we discussed today:  Goals Addressed              This Visit's Progress     Patient Stated     "I need help getting into an assisted living facility" (pt-stated)        CARE PLAN ENTRY (see longitudinal plan of care for additional care plan information)  Current Barriers:   Knowledge Deficits related to process for admission into an ALF  Chronic Disease Management support and education needs related to CKD III, DM II, HTN  Nurse Case Manager Clinical Goal(s):   Over the next 90 days, patient will work with embedded BSW and PCP to address needs related to assistance for admission into an ALF  CCM SW Interventions: Completed 10/11/19  Collaboration with Glenna Durand, LPN who indicates patient would like a return call from [place] regarding goal progression  Performed chart review to note patient recently hospitalized 7/13 - 7/20 due to COVID Pneumonia and AKI  Patient participated in a virtaul office visit with [name], [name] on 7/22 for a post hospital follow up o PCP ordered home health and Remote Health Services  Successful outbound call placed to the patient  o The patient reports he has been in contact with both home health and remote health with visits planned later in the week  Reviewed patients continued desire for ALF placement o The patient reports he is hoping to be placed in the next 1-2 months  Discussed process involved to locate a bed including physician paperwork and admission requirements such as TB skin testing and possible need for COVID 19 testing  Advised the patient SW has yet to receive completed Fl2 form from the patients provider due to the provider awaiting to complete until hospital follow up visit was complete  Collaboration with [name], [name] to request update once Fl2 is completed o Advised the patient SW will fax out completed document to surrounding ALF's in [place] to locate a bed  offer o Patient reports once a bed offer is made he would like his friend to tour the facility prior to accepting the bed offer  Scheduled follow up over the next 10 days to assess goal progression  CCM RN CM Interventions:   Inter-disciplinary care team collaboration (see longitudinal plan of care)  Determined patient would like to move into an ALF and would like assistance from the embedded BSW and PCP  Collaborated with embedded BSW [name] regarding patient's request for assistance with admission into an ALF  Discussed plans with patient for ongoing care management follow up and provided patient with direct contact information for care management team  Patient Self Care Activities:   Self administers medications as prescribed  Attends all scheduled provider appointments  Calls pharmacy for medication refills  Calls provider office for new concerns or questions  Please see past updates related to this goal by clicking on the "Past Updates" button in the selected goal          Materials Provided: Verbal education about placement process provided by phone  Follow Up Plan: SW will follow up with patient by phone over the next 10 days.   [name], BSW, CDP Social Worker, Certified Dementia Practitioner [place] [facility] [place] [facility] [phone]

## 2019-10-19 ENCOUNTER — Other Ambulatory Visit: Payer: Self-pay

## 2019-10-19 ENCOUNTER — Ambulatory Visit: Payer: Medicare Other

## 2019-10-19 DIAGNOSIS — E1121 Type 2 diabetes mellitus with diabetic nephropathy: Secondary | ICD-10-CM

## 2019-10-19 DIAGNOSIS — N1832 Chronic kidney disease, stage 3b: Secondary | ICD-10-CM

## 2019-10-19 NOTE — Chronic Care Management (AMB) (Signed)
Chronic Care Management    Social Work Follow Up Note  10/19/2019 Name: [name] MRN: [id number] DOB: April 25, 1955  [name] is a 64 y.o. year old male who is a primary care patient of [name], [place]. The CCM team was consulted for assistance with care coordination.   Review of patient status, including review of consultants reports, other relevant assessments, and collaboration with appropriate care team members and the patient's provider was performed as part of comprehensive patient evaluation and provision of chronic care management services.    SDOH (Social Determinants of Health) assessments performed: No    Outpatient Encounter Medications as of 10/19/2019  Medication Sig  . acetaminophen (TYLENOL) 500 MG tablet Take 500 mg by mouth every 6 (six) hours as needed for mild pain or moderate pain.  Marland Kitchen aspirin EC 81 MG tablet Take 81 mg by mouth daily.  Marland Kitchen atorvastatin (LIPITOR) 20 MG tablet TAKE 1 TABLET BY MOUTH EVERY DAY  . fluconazole (DIFLUCAN) 100 MG tablet Take 1 tablet (100 mg total) by mouth daily. Take 1 tablet by mouth now repeat in 5 days  . guaiFENesin (MUCINEX) 600 MG 12 hr tablet Take 1 tablet (600 mg total) by mouth 2 (two) times daily.  Marland Kitchen HYDROcodone-acetaminophen (NORCO) 5-325 MG tablet Take 1 tablet by mouth every 6 (six) hours as needed for moderate pain.  . metoprolol tartrate (LOPRESSOR) 50 MG tablet TAKE 1 TABLET BY MOUTH EVERY DAY  . Multiple Vitamin (MULTIVITAMIN) tablet Take 1 tablet by mouth daily.  Marland Kitchen MYRBETRIQ 25 MG TB24 tablet TAKE 1 TABLET BY MOUTH EVERY DAY  . nystatin (NYSTATIN) powder Apply 1 application topically 3 (three) times daily.  . RYBELSUS 14 MG TABS TAKE 1 TABLET BY MOUTH DAILY. 30 MINUTES BEFORE BREAKFAST  . sildenafil (REVATIO) 20 MG tablet TAKE 1 TABLET BY MOUTH EVERY DAY (PRIOR AUTHORIZATION DENIED)  . TRADJENTA 5 MG TABS tablet TAKE 1 TABLET BY MOUTH EVERY DAY  . traMADol (ULTRAM) 50 MG tablet Take 1 tablet (50 mg total) by mouth  every 6 (six) hours as needed for severe pain.  . traZODone (DESYREL) 50 MG tablet TAKE 1 TABLET BY MOUTH EVERYDAY AT BEDTIME   No facility-administered encounter medications on file as of 10/19/2019.     Goals Addressed              This Visit's Progress     Patient Stated   .  "I need help getting into an assisted living facility" (pt-stated)        [place] (see longitudinal plan of care for additional care plan information)  Current Barriers:  Marland Kitchen Knowledge Deficits related to process for admission into an ALF . Chronic Disease Management support and education needs related to CKD III, DM II, HTN  Nurse Case Manager Clinical Goal(s):  Marland Kitchen Over the next 90 days, patient will work with embedded BSW and PCP to address needs related to assistance for admission into an ALF  CCM SW Interventions: Completed 10/19/19 . Provided the patients primary provider, [name], [name] with updated Fl2 form for review and signature . Scheduled follow up over the next week to begin seeking bed offers once Fl2 completed  Completed 10/11/19 . Collaboration with Glenna Durand, LPN who indicates patient would like a return call from [facility] regarding goal progression . Performed chart review to note patient recently hospitalized 7/13 - 7/20 due to COVID Pneumonia and AKI . Patient participated in a virtaul office visit with [name],  [name] on 7/22 for a post hospital follow up o PCP ordered home health and Remote Health Services . Successful outbound call placed to the patient  o The patient reports he has been in contact with both home health and remote health with visits planned later in the week . Reviewed patients continued desire for ALF placement o The patient reports he is hoping to be placed in the next 1-2 months . Discussed process involved to locate a bed including physician paperwork and admission requirements such as TB skin testing and possible need for COVID 19 testing . Advised the  patient SW has yet to receive completed Fl2 form from the patients provider due to the provider awaiting to complete until hospital follow up visit was complete . Collaboration with [name], [name] to request update once Fl2 is completed o Advised the patient SW will fax out completed document to surrounding ALF's in [place] to locate a bed offer o Patient reports once a bed offer is made he would like his friend to tour the facility prior to accepting the bed offer . Scheduled follow up over the next 10 days to assess goal progression  CCM RN CM Interventions:  . Inter-disciplinary care team collaboration (see longitudinal plan of care) . Determined patient would like to move into an ALF and would like assistance from the embedded BSW and PCP . Collaborated with embedded BSW [name] regarding patient's request for assistance with admission into an ALF . Discussed plans with patient for ongoing care management follow up and provided patient with direct contact information for care management team  Patient Self Care Activities:  . Self administers medications as prescribed . Attends all scheduled provider appointments . Calls pharmacy for medication refills . Calls provider office for new concerns or questions  Please see past updates related to this goal by clicking on the "Past Updates" button in the selected goal          Follow Up Plan: SW will follow up over the next week to begin seeking plaement options for the patient.   [name], BSW, CDP Social Worker, Certified Dementia Practitioner [place] [facility] [place] [facility] [phone]  Total time spent performing care coordination and/or care management activities with the patient by phone or face to face = 15 minutes.

## 2019-10-19 NOTE — Patient Instructions (Signed)
Social Worker Visit Information  Goals we discussed today:  Goals Addressed              This Visit's Progress     Patient Stated     "I need help getting into an assisted living facility" (pt-stated)        [facility] (see longitudinal plan of care for additional care plan information)  Current Barriers:   Knowledge Deficits related to process for admission into an ALF  Chronic Disease Management support and education needs related to CKD III, DM II, HTN  Nurse Case Manager Clinical Goal(s):   Over the next 90 days, patient will work with embedded BSW and PCP to address needs related to assistance for admission into an ALF  CCM SW Interventions: Completed 10/19/19  Provided the patients primary provider, [name], [name] with updated Fl2 form for review and signature  Scheduled follow up over the next week to begin seeking bed offers once Fl2 completed  Completed 10/11/19  Collaboration with Glenna Durand, LPN who indicates patient would like a return call from [place] regarding goal progression  Performed chart review to note patient recently hospitalized 7/13 - 7/20 due to COVID Pneumonia and AKI  Patient participated in a virtaul office visit with [name], [name] on 7/22 for a post hospital follow up o PCP ordered home health and Remote Health Services  Successful outbound call placed to the patient  o The patient reports he has been in contact with both home health and remote health with visits planned later in the week  Reviewed patients continued desire for ALF placement o The patient reports he is hoping to be placed in the next 1-2 months  Discussed process involved to locate a bed including physician paperwork and admission requirements such as TB skin testing and possible need for COVID 19 testing  Advised the patient SW has yet to receive completed Fl2 form from the patients provider due to the provider awaiting to complete until hospital follow up  visit was complete  Collaboration with [name], [name] to request update once Fl2 is completed o Advised the patient SW will fax out completed document to surrounding ALF's in [place] to locate a bed offer o Patient reports once a bed offer is made he would like his friend to tour the facility prior to accepting the bed offer  Scheduled follow up over the next 10 days to assess goal progression  CCM RN CM Interventions:   Inter-disciplinary care team collaboration (see longitudinal plan of care)  Determined patient would like to move into an ALF and would like assistance from the embedded BSW and PCP  Collaborated with embedded BSW [name] regarding patient's request for assistance with admission into an ALF  Discussed plans with patient for ongoing care management follow up and provided patient with direct contact information for care management team  Patient Self Care Activities:   Self administers medications as prescribed  Attends all scheduled provider appointments  Calls pharmacy for medication refills  Calls provider office for new concerns or questions  Please see past updates related to this goal by clicking on the "Past Updates" button in the selected goal          Follow Up Plan: SW will follow up over the next week to begin seeking placement options.  [name], BSW, CDP Social Worker, Certified Dementia Practitioner [place] [facility] [place] [facility] [phone]

## 2019-10-22 ENCOUNTER — Telehealth: Payer: Self-pay | Admitting: *Deleted

## 2019-10-22 NOTE — Chronic Care Management (AMB) (Signed)
  Chronic Care Management   Note  10/22/2019 Name: [name] MRN: [id number] DOB: 07/14/1955  [name] is a 64 y.o. year old male who is a primary care patient of [name], [place] and is actively engaged with the care management team. I reached out to [name] by phone today to assist with re-scheduling a follow up visit with the Pharmacist  Follow up plan: Telephone appointment with care management team member scheduled for: 11/05/2019  [place] [place] [facility]  [place], [place] [address] Direct Dial: [email or web address] Website: [email or web address]

## 2019-10-24 DIAGNOSIS — E785 Hyperlipidemia, unspecified: Secondary | ICD-10-CM | POA: Diagnosis not present

## 2019-10-24 DIAGNOSIS — K219 Gastro-esophageal reflux disease without esophagitis: Secondary | ICD-10-CM | POA: Diagnosis not present

## 2019-10-24 DIAGNOSIS — E1169 Type 2 diabetes mellitus with other specified complication: Secondary | ICD-10-CM | POA: Diagnosis not present

## 2019-10-24 DIAGNOSIS — M109 Gout, unspecified: Secondary | ICD-10-CM | POA: Diagnosis not present

## 2019-10-24 DIAGNOSIS — K5900 Constipation, unspecified: Secondary | ICD-10-CM | POA: Diagnosis not present

## 2019-10-24 DIAGNOSIS — E871 Hypo-osmolality and hyponatremia: Secondary | ICD-10-CM | POA: Diagnosis not present

## 2019-10-24 DIAGNOSIS — J1282 Pneumonia due to coronavirus disease 2019: Secondary | ICD-10-CM | POA: Diagnosis not present

## 2019-10-24 DIAGNOSIS — Z79899 Other long term (current) drug therapy: Secondary | ICD-10-CM | POA: Diagnosis not present

## 2019-10-24 DIAGNOSIS — I129 Hypertensive chronic kidney disease with stage 1 through stage 4 chronic kidney disease, or unspecified chronic kidney disease: Secondary | ICD-10-CM | POA: Diagnosis not present

## 2019-10-24 DIAGNOSIS — U071 COVID-19: Secondary | ICD-10-CM | POA: Diagnosis not present

## 2019-10-24 DIAGNOSIS — E1140 Type 2 diabetes mellitus with diabetic neuropathy, unspecified: Secondary | ICD-10-CM | POA: Diagnosis not present

## 2019-10-24 DIAGNOSIS — E1122 Type 2 diabetes mellitus with diabetic chronic kidney disease: Secondary | ICD-10-CM | POA: Diagnosis not present

## 2019-10-24 DIAGNOSIS — N1832 Chronic kidney disease, stage 3b: Secondary | ICD-10-CM | POA: Diagnosis not present

## 2019-10-24 DIAGNOSIS — D509 Iron deficiency anemia, unspecified: Secondary | ICD-10-CM | POA: Diagnosis not present

## 2019-10-26 ENCOUNTER — Ambulatory Visit (INDEPENDENT_AMBULATORY_CARE_PROVIDER_SITE_OTHER): Payer: Medicare Other

## 2019-10-26 DIAGNOSIS — U071 COVID-19: Secondary | ICD-10-CM | POA: Diagnosis not present

## 2019-10-26 DIAGNOSIS — I10 Essential (primary) hypertension: Secondary | ICD-10-CM

## 2019-10-26 DIAGNOSIS — E1169 Type 2 diabetes mellitus with other specified complication: Secondary | ICD-10-CM | POA: Diagnosis not present

## 2019-10-26 DIAGNOSIS — J1282 Pneumonia due to coronavirus disease 2019: Secondary | ICD-10-CM | POA: Diagnosis not present

## 2019-10-26 DIAGNOSIS — E785 Hyperlipidemia, unspecified: Secondary | ICD-10-CM | POA: Diagnosis not present

## 2019-10-26 DIAGNOSIS — E1140 Type 2 diabetes mellitus with diabetic neuropathy, unspecified: Secondary | ICD-10-CM | POA: Diagnosis not present

## 2019-10-26 DIAGNOSIS — D509 Iron deficiency anemia, unspecified: Secondary | ICD-10-CM | POA: Diagnosis not present

## 2019-10-26 DIAGNOSIS — E1122 Type 2 diabetes mellitus with diabetic chronic kidney disease: Secondary | ICD-10-CM | POA: Diagnosis not present

## 2019-10-26 DIAGNOSIS — K5900 Constipation, unspecified: Secondary | ICD-10-CM | POA: Diagnosis not present

## 2019-10-26 DIAGNOSIS — N1832 Chronic kidney disease, stage 3b: Secondary | ICD-10-CM | POA: Diagnosis not present

## 2019-10-26 DIAGNOSIS — K219 Gastro-esophageal reflux disease without esophagitis: Secondary | ICD-10-CM | POA: Diagnosis not present

## 2019-10-26 DIAGNOSIS — E871 Hypo-osmolality and hyponatremia: Secondary | ICD-10-CM | POA: Diagnosis not present

## 2019-10-26 DIAGNOSIS — M109 Gout, unspecified: Secondary | ICD-10-CM | POA: Diagnosis not present

## 2019-10-26 DIAGNOSIS — Z79899 Other long term (current) drug therapy: Secondary | ICD-10-CM | POA: Diagnosis not present

## 2019-10-26 DIAGNOSIS — I129 Hypertensive chronic kidney disease with stage 1 through stage 4 chronic kidney disease, or unspecified chronic kidney disease: Secondary | ICD-10-CM | POA: Diagnosis not present

## 2019-10-26 DIAGNOSIS — E1121 Type 2 diabetes mellitus with diabetic nephropathy: Secondary | ICD-10-CM

## 2019-10-26 NOTE — Chronic Care Management (AMB) (Signed)
Chronic Care Management    Social Work Follow Up Note  10/26/2019 Name: [name] MRN: [id number] DOB: April 20, 1955  [name] is a 64 y.o. year old male who is a primary care patient of [name], [place]. The CCM team was consulted for assistance with care coordination.   Review of patient status, including review of consultants reports, other relevant assessments, and collaboration with appropriate care team members and the patient's provider was performed as part of comprehensive patient evaluation and provision of chronic care management services.      Outpatient Encounter Medications as of 10/26/2019  Medication Sig  . acetaminophen (TYLENOL) 500 MG tablet Take 500 mg by mouth every 6 (six) hours as needed for mild pain or moderate pain.  Marland Kitchen aspirin EC 81 MG tablet Take 81 mg by mouth daily.  Marland Kitchen atorvastatin (LIPITOR) 20 MG tablet TAKE 1 TABLET BY MOUTH EVERY DAY  . fluconazole (DIFLUCAN) 100 MG tablet Take 1 tablet (100 mg total) by mouth daily. Take 1 tablet by mouth now repeat in 5 days  . guaiFENesin (MUCINEX) 600 MG 12 hr tablet Take 1 tablet (600 mg total) by mouth 2 (two) times daily.  Marland Kitchen HYDROcodone-acetaminophen (NORCO) 5-325 MG tablet Take 1 tablet by mouth every 6 (six) hours as needed for moderate pain.  . metoprolol tartrate (LOPRESSOR) 50 MG tablet TAKE 1 TABLET BY MOUTH EVERY DAY  . Multiple Vitamin (MULTIVITAMIN) tablet Take 1 tablet by mouth daily.  Marland Kitchen MYRBETRIQ 25 MG TB24 tablet TAKE 1 TABLET BY MOUTH EVERY DAY  . nystatin (NYSTATIN) powder Apply 1 application topically 3 (three) times daily.  . RYBELSUS 14 MG TABS TAKE 1 TABLET BY MOUTH DAILY. 30 MINUTES BEFORE BREAKFAST  . sildenafil (REVATIO) 20 MG tablet TAKE 1 TABLET BY MOUTH EVERY DAY (PRIOR AUTHORIZATION DENIED)  . TRADJENTA 5 MG TABS tablet TAKE 1 TABLET BY MOUTH EVERY DAY  . traMADol (ULTRAM) 50 MG tablet Take 1 tablet (50 mg total) by mouth every 6 (six) hours as needed for severe pain.  . traZODone  (DESYREL) 50 MG tablet TAKE 1 TABLET BY MOUTH EVERYDAY AT BEDTIME   No facility-administered encounter medications on file as of 10/26/2019.     Goals Addressed              This Visit's Progress     Patient Stated   .  "I need help getting into an assisted living facility" (pt-stated)   On track     [place] (see longitudinal plan of care for additional care plan information)  Current Barriers:  Marland Kitchen Knowledge Deficits related to process for admission into an ALF . Chronic Disease Management support and education needs related to CKD III, DM II, HTN  Nurse Case Manager Clinical Goal(s):  Marland Kitchen Over the next 90 days, patient will work with embedded BSW and PCP to address needs related to assistance for admission into an ALF  CCM SW Interventions: Completed 10/26/19 . Obtained completed Fl2 from provider . Collaboration with all [facility] ALF's that accept special assistance Medicaid to seek a bed offer . Inbound call received from [facility] with [place] [phone]) who reports open bed available at this time o Advised [name] SW would review opportunity with the patient to assess interest . Inbound call received from [employer] with [facility] [phone]) who reports ability to make a bed offer o Patient able to come in for a tour with a negative COVID test o  Advised Mrs. Melony Overly SW will review bed offer with the patient to assess interest . Unsuccessful outbound call placed to the patient, unable to leave a voice message due to mailbox being full . Planned follow up with the patient on 10/27/19  CCM RN CM Interventions:  . Inter-disciplinary care team collaboration (see longitudinal plan of care) . Determined patient would like to move into an ALF and would like assistance from the embedded BSW and PCP . Collaborated with embedded BSW [name] regarding patient's request for assistance with admission into an ALF . Discussed  plans with patient for ongoing care management follow up and provided patient with direct contact information for care management team  Patient Self Care Activities:  . Self administers medications as prescribed . Attends all scheduled provider appointments . Calls pharmacy for medication refills . Calls provider office for new concerns or questions  Please see past updates related to this goal by clicking on the "Past Updates" button in the selected goal          Follow Up Plan: SW will follow up with patient by phone over the next day.  [name], BSW, CDP Social Worker, Certified Dementia Practitioner [place] [facility] [place] [facility] [phone]  Total time spent performing care coordination and/or care management activities with the patient by phone or face to face = 35 minutes.

## 2019-10-26 NOTE — Patient Instructions (Signed)
Social Worker Visit Information  Goals we discussed today:  Goals Addressed              This Visit's Progress     Patient Stated   .  "I need help getting into an assisted living facility" (pt-stated)   On track     [place] (see longitudinal plan of care for additional care plan information)  Current Barriers:  Marland Kitchen Knowledge Deficits related to process for admission into an ALF . Chronic Disease Management support and education needs related to CKD III, DM II, HTN  Nurse Case Manager Clinical Goal(s):  Marland Kitchen Over the next 90 days, patient will work with embedded BSW and PCP to address needs related to assistance for admission into an ALF  CCM SW Interventions: Completed 10/26/19 . Obtained completed Fl2 from provider . Collaboration with all [facility] ALF's that accept special assistance Medicaid to seek a bed offer . Inbound call received from [facility] with [place] [phone]) who reports open bed available at this time o Advised [name] SW would review opportunity with the patient to assess interest . Inbound call received from [employer] with [facility] [phone]) who reports ability to make a bed offer o Patient able to come in for a tour with a negative COVID test o Advised Mrs. Melony Overly SW will review bed offer with the patient to assess interest . Unsuccessful outbound call placed to the patient, unable to leave a voice message due to mailbox being full . Planned follow up with the patient on 10/27/19  Completed 10/19/19 . Provided the patients primary provider, [name], [name] with updated Fl2 form for review and signature . Scheduled follow up over the next week to begin seeking bed offers once Fl2 completed  Completed 10/11/19 . Collaboration with Glenna Durand, LPN who indicates patient would like a return call from [facility] regarding goal progression . Performed chart review to note patient recently hospitalized 7/13 -  7/20 due to COVID Pneumonia and AKI . Patient participated in a virtaul office visit with [name], [name] on 7/22 for a post hospital follow up o PCP ordered home health and Remote Health Services . Successful outbound call placed to the patient  o The patient reports he has been in contact with both home health and remote health with visits planned later in the week . Reviewed patients continued desire for ALF placement o The patient reports he is hoping to be placed in the next 1-2 months . Discussed process involved to locate a bed including physician paperwork and admission requirements such as TB skin testing and possible need for COVID 19 testing . Advised the patient SW has yet to receive completed Fl2 form from the patients provider due to the provider awaiting to complete until hospital follow up visit was complete . Collaboration with [name], [name] to request update once Fl2 is completed o Advised the patient SW will fax out completed document to surrounding ALF's in [place] to locate a bed offer o Patient reports once a bed offer is made he would like his friend to tour the facility prior to accepting the bed offer . Scheduled follow up over the next 10 days to assess goal progression  CCM RN CM Interventions:  . Inter-disciplinary care team collaboration (see longitudinal plan of care) . Determined patient would like to move into an ALF and would like assistance from the embedded BSW and PCP . Collaborated with embedded BSW  [name] regarding patient's request for assistance with admission into an ALF . Discussed plans with patient for ongoing care management follow up and provided patient with direct contact information for care management team  Patient Self Care Activities:  . Self administers medications as prescribed . Attends all scheduled provider appointments . Calls pharmacy for medication refills . Calls provider office for new concerns or  questions  Please see past updates related to this goal by clicking on the "Past Updates" button in the selected goal          Materials Provided: No. Patient not reached.  Follow Up Plan: SW will follow up with patient by phone over the next day.   [name], BSW, CDP Social Worker, Certified Dementia Practitioner [place] [facility] [place] [facility] [phone]

## 2019-10-27 ENCOUNTER — Ambulatory Visit: Payer: Medicare Other

## 2019-10-27 DIAGNOSIS — N1832 Chronic kidney disease, stage 3b: Secondary | ICD-10-CM

## 2019-10-27 DIAGNOSIS — E1121 Type 2 diabetes mellitus with diabetic nephropathy: Secondary | ICD-10-CM

## 2019-10-27 DIAGNOSIS — I10 Essential (primary) hypertension: Secondary | ICD-10-CM

## 2019-10-27 NOTE — Chronic Care Management (AMB) (Signed)
Chronic Care Management    Social Work Follow Up Note  10/27/2019 Name: [name] MRN: [id number] DOB: [birth year]  [name] is a 64 y.o. year old male who is a primary care patient of [name], [place]. The CCM team was consulted for assistance with care coordination.   Review of patient status, including review of consultants reports, other relevant assessments, and collaboration with appropriate care team members and the patient's provider was performed as part of comprehensive patient evaluation and provision of chronic care management services.    SDOH (Social Determinants of Health) assessments performed: No    Outpatient Encounter Medications as of 10/27/2019  Medication Sig  . acetaminophen (TYLENOL) 500 MG tablet Take 500 mg by mouth every 6 (six) hours as needed for mild pain or moderate pain.  Marland Kitchen aspirin EC 81 MG tablet Take 81 mg by mouth daily.  Marland Kitchen atorvastatin (LIPITOR) 20 MG tablet TAKE 1 TABLET BY MOUTH EVERY DAY  . fluconazole (DIFLUCAN) 100 MG tablet Take 1 tablet (100 mg total) by mouth daily. Take 1 tablet by mouth now repeat in 5 days  . guaiFENesin (MUCINEX) 600 MG 12 hr tablet Take 1 tablet (600 mg total) by mouth 2 (two) times daily.  Marland Kitchen HYDROcodone-acetaminophen (NORCO) 5-325 MG tablet Take 1 tablet by mouth every 6 (six) hours as needed for moderate pain.  . metoprolol tartrate (LOPRESSOR) 50 MG tablet TAKE 1 TABLET BY MOUTH EVERY DAY  . Multiple Vitamin (MULTIVITAMIN) tablet Take 1 tablet by mouth daily.  Marland Kitchen MYRBETRIQ 25 MG TB24 tablet TAKE 1 TABLET BY MOUTH EVERY DAY  . nystatin (NYSTATIN) powder Apply 1 application topically 3 (three) times daily.  . RYBELSUS 14 MG TABS TAKE 1 TABLET BY MOUTH DAILY. 30 MINUTES BEFORE BREAKFAST  . sildenafil (REVATIO) 20 MG tablet TAKE 1 TABLET BY MOUTH EVERY DAY (PRIOR AUTHORIZATION DENIED)  . TRADJENTA 5 MG TABS tablet TAKE 1 TABLET BY MOUTH EVERY DAY  . traMADol (ULTRAM) 50 MG tablet Take 1 tablet (50 mg total) by mouth  every 6 (six) hours as needed for severe pain.  . traZODone (DESYREL) 50 MG tablet TAKE 1 TABLET BY MOUTH EVERYDAY AT BEDTIME   No facility-administered encounter medications on file as of 10/27/2019.     Goals Addressed              This Visit's Progress     Patient Stated   .  "I need help getting into an assisted living facility" (pt-stated)        [place] (see longitudinal plan of care for additional care plan information)  Current Barriers:  Marland Kitchen Knowledge Deficits related to process for admission into an ALF . Chronic Disease Management support and education needs related to CKD III, DM II, HTN  Nurse Case Manager Clinical Goal(s):  Marland Kitchen Over the next 90 days, patient will work with embedded BSW and PCP to address needs related to assistance for admission into an ALF  CCM SW Interventions: Completed 10/27/19 . Successful outbound call placed to the patient to review bed offers . Discussed patient has received a bed offer at both [place] and [place] . Determined the patient is interested in contacting [place] to request more information about their community o Provided the patient with contact information . Discussed the patient has yet to receive second dose of COVID vaccine and is not sure he will based on receiving one dose and recovering from [place] he  feels a second dose is not needed o Advised the patient SW is unable to confirm placement will be available without him being fully vaccinated o Collaboration with [name], [name] for further guidance . Collaboration with RN Care Manager [place] to discuss patient current bed offers o Discussed concern surrounding star ratings of both facilities . Successful outbound call placed to the patient to review star ratings and deficiencies on last county monitoring visit o Determined the patient prefers to move into an ALF that is 3 stars or greater o The patient reports he  is currently on the wait list at [place] patient states he is not interested in [facility] due to neighborhood the community is in . SW will follow up with the patient over the next week  CCM RN CM Interventions:  . Inter-disciplinary care team collaboration (see longitudinal plan of care) . Determined patient would like to move into an ALF and would like assistance from the embedded BSW and PCP . Collaborated with embedded BSW [name] regarding patient's request for assistance with admission into an ALF . Discussed plans with patient for ongoing care management follow up and provided patient with direct contact information for care management team  Patient Self Care Activities:  . Self administers medications as prescribed . Attends all scheduled provider appointments . Calls pharmacy for medication refills . Calls provider office for new concerns or questions  Please see past updates related to this goal by clicking on the "Past Updates" button in the selected goal          Follow Up Plan: SW will follow up with patient by phone over the next week.   [name], BSW, CDP Social Worker, Certified Dementia Practitioner [place] [facility] [place] [facility] [phone]  Total time spent performing care coordination and/or care management activities with the patient by phone or face to face = 30 minutes.

## 2019-10-27 NOTE — Patient Instructions (Signed)
Social Worker Visit Information  Goals we discussed today:  Goals Addressed              This Visit's Progress     Patient Stated   .  "I need help getting into an assisted living facility" (pt-stated)        [place] (see longitudinal plan of care for additional care plan information)  Current Barriers:  Marland Kitchen Knowledge Deficits related to process for admission into an ALF . Chronic Disease Management support and education needs related to CKD III, DM II, HTN  Nurse Case Manager Clinical Goal(s):  Marland Kitchen Over the next 90 days, patient will work with embedded BSW and PCP to address needs related to assistance for admission into an ALF  CCM SW Interventions: Completed 10/27/19 . Successful outbound call placed to the patient to review bed offers . Discussed patient has received a bed offer at both [place] and [place] . Determined the patient is interested in contacting [place] to request more information about their community o Provided the patient with contact information . Discussed the patient has yet to receive second dose of COVID vaccine and is not sure he will based on receiving one dose and recovering from [place] he feels a second dose is not needed o Advised the patient SW is unable to confirm placement will be available without him being fully vaccinated o Collaboration with [name], [name] for further guidance . Collaboration with RN Care Manager [place] to discuss patient current bed offers o Discussed concern surrounding star ratings of both facilities . Successful outbound call placed to the patient to review star ratings and deficiencies on last county monitoring visit o Determined the patient prefers to move into an ALF that is 3 stars or greater o The patient reports he is currently on the wait list at [facility] patient states he is not interested in [facility] due to neighborhood the community is  in . SW will follow up with the patient over the next week  CCM RN CM Interventions:  . Inter-disciplinary care team collaboration (see longitudinal plan of care) . Determined patient would like to move into an ALF and would like assistance from the embedded BSW and PCP . Collaborated with embedded BSW [name] regarding patient's request for assistance with admission into an ALF . Discussed plans with patient for ongoing care management follow up and provided patient with direct contact information for care management team  Patient Self Care Activities:  . Self administers medications as prescribed . Attends all scheduled provider appointments . Calls pharmacy for medication refills . Calls provider office for new concerns or questions  Please see past updates related to this goal by clicking on the "Past Updates" button in the selected goal         Follow Up Plan: SW will follow up with patient by phone over the next week   [name], BSW, CDP Social Worker, Certified Dementia Practitioner [place] [facility] [place] [facility] [phone]

## 2019-10-28 ENCOUNTER — Telehealth: Payer: Medicare Other

## 2019-10-28 DIAGNOSIS — E785 Hyperlipidemia, unspecified: Secondary | ICD-10-CM | POA: Diagnosis not present

## 2019-10-28 DIAGNOSIS — J1282 Pneumonia due to coronavirus disease 2019: Secondary | ICD-10-CM | POA: Diagnosis not present

## 2019-10-28 DIAGNOSIS — Z79899 Other long term (current) drug therapy: Secondary | ICD-10-CM | POA: Diagnosis not present

## 2019-10-28 DIAGNOSIS — E1169 Type 2 diabetes mellitus with other specified complication: Secondary | ICD-10-CM | POA: Diagnosis not present

## 2019-10-28 DIAGNOSIS — U071 COVID-19: Secondary | ICD-10-CM | POA: Diagnosis not present

## 2019-10-28 DIAGNOSIS — K219 Gastro-esophageal reflux disease without esophagitis: Secondary | ICD-10-CM | POA: Diagnosis not present

## 2019-10-28 DIAGNOSIS — K5900 Constipation, unspecified: Secondary | ICD-10-CM | POA: Diagnosis not present

## 2019-10-28 DIAGNOSIS — E1140 Type 2 diabetes mellitus with diabetic neuropathy, unspecified: Secondary | ICD-10-CM | POA: Diagnosis not present

## 2019-10-28 DIAGNOSIS — E1122 Type 2 diabetes mellitus with diabetic chronic kidney disease: Secondary | ICD-10-CM | POA: Diagnosis not present

## 2019-10-28 DIAGNOSIS — E871 Hypo-osmolality and hyponatremia: Secondary | ICD-10-CM | POA: Diagnosis not present

## 2019-10-28 DIAGNOSIS — N1832 Chronic kidney disease, stage 3b: Secondary | ICD-10-CM | POA: Diagnosis not present

## 2019-10-28 DIAGNOSIS — D509 Iron deficiency anemia, unspecified: Secondary | ICD-10-CM | POA: Diagnosis not present

## 2019-10-28 DIAGNOSIS — M109 Gout, unspecified: Secondary | ICD-10-CM | POA: Diagnosis not present

## 2019-10-28 DIAGNOSIS — I129 Hypertensive chronic kidney disease with stage 1 through stage 4 chronic kidney disease, or unspecified chronic kidney disease: Secondary | ICD-10-CM | POA: Diagnosis not present

## 2019-10-29 ENCOUNTER — Ambulatory Visit: Payer: Medicare Other

## 2019-10-29 DIAGNOSIS — L603 Nail dystrophy: Secondary | ICD-10-CM | POA: Diagnosis not present

## 2019-10-29 DIAGNOSIS — L84 Corns and callosities: Secondary | ICD-10-CM | POA: Diagnosis not present

## 2019-10-29 DIAGNOSIS — I739 Peripheral vascular disease, unspecified: Secondary | ICD-10-CM | POA: Diagnosis not present

## 2019-10-29 DIAGNOSIS — E1151 Type 2 diabetes mellitus with diabetic peripheral angiopathy without gangrene: Secondary | ICD-10-CM | POA: Diagnosis not present

## 2019-10-29 DIAGNOSIS — N1832 Chronic kidney disease, stage 3b: Secondary | ICD-10-CM

## 2019-10-29 DIAGNOSIS — E1121 Type 2 diabetes mellitus with diabetic nephropathy: Secondary | ICD-10-CM

## 2019-10-29 DIAGNOSIS — I10 Essential (primary) hypertension: Secondary | ICD-10-CM

## 2019-10-29 NOTE — Patient Instructions (Signed)
Social Worker Visit Information  Goals we discussed today:  Goals Addressed              This Visit's Progress     Patient Stated     "I need help getting into an assisted living facility" (pt-stated)   Not on track     [name] (see longitudinal plan of care for additional care plan information)  Current Barriers:   Knowledge Deficits related to process for admission into an ALF  Chronic Disease Management support and education needs related to CKD III, DM II, HTN  Nurse Case Manager Clinical Goal(s):   Over the next 90 days, patient will work with embedded BSW and PCP to address needs related to assistance for admission into an ALF  CCM SW Interventions: Completed 10/29/19  Collaboration with patient primary provider who reports one dose of Moderna is approximately 80% effective and recommendation for patient to receive second Moderna dose of COVID 19 vaccine to better protect the patient prior to placement  Successful outbound call placed to the patient to relay information including approximate efficacy and recommendation for the patient to receive the second dose prior to placement  Determined the patient is not considering a second dose at this time due to side effects from first vaccine and the concern that two doses are not 100% effective in preventing COVID 19 infection o SW discussed the patient's reaction may have been due to vaccine and/or the COVID 19 infection the patient contracted shortly after vaccination o Advised the patient that due to plans to move into an ALF, risk of infection would be higher in communal living due to inability to social distance  Advised the patient SW is unclear if an ALF will allow placement with only one dose of the vaccine o Patient reported understanding and still reports plan to decline second dose at this time  Discussed the patient still only has two current bed offers from [facility] and [facility] o The patient is choosing to decline both bed offers due to star rating and neighborhood facilities are located in o The patient reports he is on a waiting list for [facility] which is his first choice  Scheduled follow up call over the next week to reach out to communities to confirm receipt of Fl2 and inquire bed availability  Collaboration with Randy Brine, [name] and [name] to inform on plan  Completed 10/27/19  Successful outbound call placed to the patient to review bed offers  Discussed patient has received a bed offer at both [place] and [place] the patient is interested in contacting [place] to request more information about their community o Provided the patient with contact information  Discussed the patient has yet to receive second dose of COVID vaccine and is not sure he will based on receiving one dose and recovering from [name] he feels a second dose is not needed o Advised the patient SW is unable to confirm placement will be available without him being fully vaccinated o Collaboration with Randy Brine, [name] for further guidance  Collaboration with [name] to discuss patient current bed offers o Discussed concern surrounding star ratings of both facilities  Successful outbound call placed to the patient to review star ratings and deficiencies on last county monitoring visit o Determined the patient prefers to move into an ALF that is 3 stars or greater o  The patient reports he is currently on the wait list at [place] patient states he is not interested in [facility] due to neighborhood the community is in  SW will follow up with the patient over the next week  CCM RN CM Interventions:   Inter-disciplinary care team collaboration (see longitudinal plan of care)  Determined patient would like to move into an ALF and would like assistance  from the embedded BSW and PCP  Collaborated with embedded BSW [name] regarding patient's request for assistance with admission into an ALF  Discussed plans with patient for ongoing care management follow up and provided patient with direct contact information for care management team  Patient Self Care Activities:   Self administers medications as prescribed  Attends all scheduled provider appointments  Calls pharmacy for medication refills  Calls provider office for new concerns or questions  Please see past updates related to this goal by clicking on the "Past Updates" button in the selected goal          Materials Provided: Verbal education about covid 19 vaccine and placement provided by phone  Follow Up Plan: SW will follow up with placement options over the next week,   [name], BSW, CDP Social Worker, Certified Dementia Practitioner [place] [facility] [place] [facility] [phone]

## 2019-10-29 NOTE — Chronic Care Management (AMB) (Signed)
Chronic Care Management    Social Work Follow Up Note  10/29/2019 Name: [name] MRN: [id number] DOB: 11-Aug-1955  [name] is a 64 y.o. year old male who is a primary care patient of [name], [place]. The CCM team was consulted for assistance with care coordination.   Review of patient status, including review of consultants reports, other relevant assessments, and collaboration with appropriate care team members and the patient's provider was performed as part of comprehensive patient evaluation and provision of chronic care management services.    SDOH (Social Determinants of Health) assessments performed: No    Outpatient Encounter Medications as of 10/29/2019  Medication Sig  . acetaminophen (TYLENOL) 500 MG tablet Take 500 mg by mouth every 6 (six) hours as needed for mild pain or moderate pain.  Marland Kitchen aspirin EC 81 MG tablet Take 81 mg by mouth daily.  Marland Kitchen atorvastatin (LIPITOR) 20 MG tablet TAKE 1 TABLET BY MOUTH EVERY DAY  . fluconazole (DIFLUCAN) 100 MG tablet Take 1 tablet (100 mg total) by mouth daily. Take 1 tablet by mouth now repeat in 5 days  . guaiFENesin (MUCINEX) 600 MG 12 hr tablet Take 1 tablet (600 mg total) by mouth 2 (two) times daily.  Marland Kitchen HYDROcodone-acetaminophen (NORCO) 5-325 MG tablet Take 1 tablet by mouth every 6 (six) hours as needed for moderate pain.  . metoprolol tartrate (LOPRESSOR) 50 MG tablet TAKE 1 TABLET BY MOUTH EVERY DAY  . Multiple Vitamin (MULTIVITAMIN) tablet Take 1 tablet by mouth daily.  Marland Kitchen MYRBETRIQ 25 MG TB24 tablet TAKE 1 TABLET BY MOUTH EVERY DAY  . nystatin (NYSTATIN) powder Apply 1 application topically 3 (three) times daily.  . RYBELSUS 14 MG TABS TAKE 1 TABLET BY MOUTH DAILY. 30 MINUTES BEFORE BREAKFAST  . sildenafil (REVATIO) 20 MG tablet TAKE 1 TABLET BY MOUTH EVERY DAY (PRIOR AUTHORIZATION DENIED)  . TRADJENTA 5 MG TABS tablet TAKE 1 TABLET BY MOUTH EVERY DAY  . traMADol (ULTRAM) 50 MG tablet Take 1 tablet (50 mg total) by mouth  every 6 (six) hours as needed for severe pain.  . traZODone (DESYREL) 50 MG tablet TAKE 1 TABLET BY MOUTH EVERYDAY AT BEDTIME   No facility-administered encounter medications on file as of 10/29/2019.     Goals Addressed              This Visit's Progress     Patient Stated   .  "I need help getting into an assisted living facility" (pt-stated)   Not on track     [place] (see longitudinal plan of care for additional care plan information)  Current Barriers:  Marland Kitchen Knowledge Deficits related to process for admission into an ALF . Chronic Disease Management support and education needs related to CKD III, DM II, HTN  Nurse Case Manager Clinical Goal(s):  Marland Kitchen Over the next 90 days, patient will work with embedded BSW and PCP to address needs related to assistance for admission into an ALF  CCM SW Interventions: Completed 10/29/19 . Collaboration with patient primary provider who reports one dose of Moderna is approximately 80% effective and recommendation for patient to receive second Moderna dose of COVID 19 vaccine to better protect the patient prior to placement . Successful outbound call placed to the patient to relay information including approximate efficacy and recommendation for the patient to receive the second dose prior to placement . Determined the patient is not considering a second dose at this time due to side effects from  first vaccine and the concern that two doses are not 100% effective in preventing COVID 19 infection o SW discussed the patient's reaction may have been due to vaccine and/or the COVID 19 infection the patient contracted shortly after vaccination o Advised the patient that due to plans to move into an ALF, risk of infection would be higher in communal living due to inability to social distance . Advised the patient SW is unclear if an ALF will allow placement with only one dose of the vaccine o Patient reported understanding and still reports plan to  decline second dose at this time . Discussed the patient still only has two current bed offers from [facility] and [facility] o The patient is choosing to decline both bed offers due to star rating and neighborhood facilities are located in o The patient reports he is on a waiting list for [facility] which is his first choice . Scheduled follow up call over the next week to reach out to communities to confirm receipt of Fl2 and inquire bed availability . Collaboration with [name], [name] and [place] to inform on plan  Completed 10/27/19 . Successful outbound call placed to the patient to review bed offers . Discussed patient has received a bed offer at both [place] and [place] . Determined the patient is interested in contacting [place] to request more information about their community o Provided the patient with contact information . Discussed the patient has yet to receive second dose of COVID vaccine and is not sure he will based on receiving one dose and recovering from [place] he feels a second dose is not needed o Advised the patient SW is unable to confirm placement will be available without him being fully vaccinated o Collaboration with [name], [name] for further guidance . Collaboration with RN Care Manager [place] to discuss patient current bed offers o Discussed concern surrounding star ratings of both facilities . Successful outbound call placed to the patient to review star ratings and deficiencies on last county monitoring visit o Determined the patient prefers to move into an ALF that is 3 stars or greater o The patient reports he is currently on the wait list at [facility] patient states he is not interested in [facility] due to neighborhood the community is in . SW will follow up with the patient over the next week  CCM RN CM Interventions:  . Inter-disciplinary  care team collaboration (see longitudinal plan of care) . Determined patient would like to move into an ALF and would like assistance from the embedded BSW and PCP . Collaborated with embedded BSW [name] regarding patient's request for assistance with admission into an ALF . Discussed plans with patient for ongoing care management follow up and provided patient with direct contact information for care management team  Patient Self Care Activities:  . Self administers medications as prescribed . Attends all scheduled provider appointments . Calls pharmacy for medication refills . Calls provider office for new concerns or questions  Please see past updates related to this goal by clicking on the "Past Updates" button in the selected goal          Follow Up Plan: SW will follow up with placement options over the next week.   [name], BSW, CDP Social Worker, Certified Dementia Practitioner [place] / [place] [facility] [phone]  Total time spent performing care coordination and/or  care management activities with the patient by phone or face to face = 25 minutes.

## 2019-11-01 DIAGNOSIS — M109 Gout, unspecified: Secondary | ICD-10-CM | POA: Diagnosis not present

## 2019-11-01 DIAGNOSIS — E1122 Type 2 diabetes mellitus with diabetic chronic kidney disease: Secondary | ICD-10-CM | POA: Diagnosis not present

## 2019-11-01 DIAGNOSIS — J1282 Pneumonia due to coronavirus disease 2019: Secondary | ICD-10-CM | POA: Diagnosis not present

## 2019-11-01 DIAGNOSIS — K5900 Constipation, unspecified: Secondary | ICD-10-CM | POA: Diagnosis not present

## 2019-11-01 DIAGNOSIS — K219 Gastro-esophageal reflux disease without esophagitis: Secondary | ICD-10-CM | POA: Diagnosis not present

## 2019-11-01 DIAGNOSIS — D509 Iron deficiency anemia, unspecified: Secondary | ICD-10-CM | POA: Diagnosis not present

## 2019-11-01 DIAGNOSIS — N1832 Chronic kidney disease, stage 3b: Secondary | ICD-10-CM | POA: Diagnosis not present

## 2019-11-01 DIAGNOSIS — E871 Hypo-osmolality and hyponatremia: Secondary | ICD-10-CM | POA: Diagnosis not present

## 2019-11-01 DIAGNOSIS — I129 Hypertensive chronic kidney disease with stage 1 through stage 4 chronic kidney disease, or unspecified chronic kidney disease: Secondary | ICD-10-CM | POA: Diagnosis not present

## 2019-11-01 DIAGNOSIS — E785 Hyperlipidemia, unspecified: Secondary | ICD-10-CM | POA: Diagnosis not present

## 2019-11-01 DIAGNOSIS — U071 COVID-19: Secondary | ICD-10-CM | POA: Diagnosis not present

## 2019-11-01 DIAGNOSIS — Z79899 Other long term (current) drug therapy: Secondary | ICD-10-CM | POA: Diagnosis not present

## 2019-11-01 DIAGNOSIS — E1140 Type 2 diabetes mellitus with diabetic neuropathy, unspecified: Secondary | ICD-10-CM | POA: Diagnosis not present

## 2019-11-01 DIAGNOSIS — E1169 Type 2 diabetes mellitus with other specified complication: Secondary | ICD-10-CM | POA: Diagnosis not present

## 2019-11-03 ENCOUNTER — Ambulatory Visit: Payer: Medicare Other

## 2019-11-03 DIAGNOSIS — E1121 Type 2 diabetes mellitus with diabetic nephropathy: Secondary | ICD-10-CM

## 2019-11-03 DIAGNOSIS — E871 Hypo-osmolality and hyponatremia: Secondary | ICD-10-CM | POA: Diagnosis not present

## 2019-11-03 DIAGNOSIS — E1122 Type 2 diabetes mellitus with diabetic chronic kidney disease: Secondary | ICD-10-CM | POA: Diagnosis not present

## 2019-11-03 DIAGNOSIS — J1282 Pneumonia due to coronavirus disease 2019: Secondary | ICD-10-CM | POA: Diagnosis not present

## 2019-11-03 DIAGNOSIS — E1140 Type 2 diabetes mellitus with diabetic neuropathy, unspecified: Secondary | ICD-10-CM | POA: Diagnosis not present

## 2019-11-03 DIAGNOSIS — E1169 Type 2 diabetes mellitus with other specified complication: Secondary | ICD-10-CM | POA: Diagnosis not present

## 2019-11-03 DIAGNOSIS — N1832 Chronic kidney disease, stage 3b: Secondary | ICD-10-CM | POA: Diagnosis not present

## 2019-11-03 DIAGNOSIS — K219 Gastro-esophageal reflux disease without esophagitis: Secondary | ICD-10-CM | POA: Diagnosis not present

## 2019-11-03 DIAGNOSIS — I129 Hypertensive chronic kidney disease with stage 1 through stage 4 chronic kidney disease, or unspecified chronic kidney disease: Secondary | ICD-10-CM | POA: Diagnosis not present

## 2019-11-03 DIAGNOSIS — K5900 Constipation, unspecified: Secondary | ICD-10-CM | POA: Diagnosis not present

## 2019-11-03 DIAGNOSIS — U071 COVID-19: Secondary | ICD-10-CM | POA: Diagnosis not present

## 2019-11-03 DIAGNOSIS — E785 Hyperlipidemia, unspecified: Secondary | ICD-10-CM | POA: Diagnosis not present

## 2019-11-03 DIAGNOSIS — Z79899 Other long term (current) drug therapy: Secondary | ICD-10-CM | POA: Diagnosis not present

## 2019-11-03 DIAGNOSIS — M109 Gout, unspecified: Secondary | ICD-10-CM | POA: Diagnosis not present

## 2019-11-03 DIAGNOSIS — D509 Iron deficiency anemia, unspecified: Secondary | ICD-10-CM | POA: Diagnosis not present

## 2019-11-03 NOTE — Patient Instructions (Signed)
Social Worker Visit Information  Goals we discussed today:  Goals Addressed              This Visit's Progress     Patient Stated   .  "I need help getting into an assisted living facility" (pt-stated)        [place] (see longitudinal plan of care for additional care plan information)  Current Barriers:  Marland Kitchen Knowledge Deficits related to process for admission into an ALF . Chronic Disease Management support and education needs related to CKD III, DM II, HTN  Nurse Case Manager Clinical Goal(s):  Marland Kitchen Over the next 90 days, patient will work with embedded BSW and PCP to address needs related to assistance for admission into an ALF  CCM SW Interventions: Completed 11/03/19 . Successful outbound call placed to the patient to advise no other bed offers have been made at this time . Discussed opportunity to stay in the home and await a bed a [facility], which the patient previously identified as his top choice o The patient reports he is not sure if he would like to wait on placement at [place] patient indicates he is interested in [place] . Successful outbound call placed to [place] with Joycelyn Schmid who confirms SA beds are still available o Unfortunately, tours are not permitted at this time in person but [name] reports she is happy to participate in a zoom call o Provided [name] with the patients contact number to discuss a zoom tour . Scheduled follow up call over the next week to assess goal progression  Completed 10/29/19 . Collaboration with patient primary provider who reports one dose of Moderna is approximately 80% effective and recommendation for patient to receive second Moderna dose of COVID 19 vaccine to better protect the patient prior to placement . Successful outbound call placed to the patient to relay information including approximate efficacy and recommendation for the patient to  receive the second dose prior to placement . Determined the patient is not considering a second dose at this time due to side effects from first vaccine and the concern that two doses are not 100% effective in preventing COVID 19 infection o SW discussed the patient's reaction may have been due to vaccine and/or the COVID 19 infection the patient contracted shortly after vaccination o Advised the patient that due to plans to move into an ALF, risk of infection would be higher in communal living due to inability to social distance . Advised the patient SW is unclear if an ALF will allow placement with only one dose of the vaccine o Patient reported understanding and still reports plan to decline second dose at this time . Discussed the patient still only has two current bed offers from [facility] and [facility] o The patient is choosing to decline both bed offers due to star rating and neighborhood facilities are located in o The patient reports he is on a waiting list for [facility] which is his first choice . Scheduled follow up call over the next week to reach out to communities to confirm receipt of Fl2 and inquire bed availability . Collaboration with [name], [name] and [place] to inform on plan  Completed 10/27/19 . Successful outbound call placed to the patient to review bed offers . Discussed patient has received a bed offer at both [facility] and  [facility] . Determined the patient is interested in contacting [place] to request more information about their community o Provided the patient with contact information . Discussed the patient has yet to receive second dose of COVID vaccine and is not sure he will based on receiving one dose and recovering from [place] he feels a second dose is not needed o Advised the patient SW is unable to confirm placement will be available without him being fully  vaccinated o Collaboration with [name], [name] for further guidance . Collaboration with RN Care Manager [place] to discuss patient current bed offers o Discussed concern surrounding star ratings of both facilities . Successful outbound call placed to the patient to review star ratings and deficiencies on last county monitoring visit o Determined the patient prefers to move into an ALF that is 3 stars or greater o The patient reports he is currently on the wait list at [facility] patient states he is not interested in [facility] due to neighborhood the community is in . SW will follow up with the patient over the next week  CCM RN CM Interventions:  . Inter-disciplinary care team collaboration (see longitudinal plan of care) . Determined patient would like to move into an ALF and would like assistance from the embedded BSW and PCP . Collaborated with embedded BSW [name] regarding patient's request for assistance with admission into an ALF . Discussed plans with patient for ongoing care management follow up and provided patient with direct contact information for care management team  Patient Self Care Activities:  . Self administers medications as prescribed . Attends all scheduled provider appointments . Calls pharmacy for medication refills . Calls provider office for new concerns or questions  Please see past updates related to this goal by clicking on the "Past Updates" button in the selected goal          Follow Up Plan: SW will follow up with patient by phone over the next week.   [name], BSW, CDP Social Worker, Certified Dementia Practitioner [place] [facility] [phone]

## 2019-11-03 NOTE — Chronic Care Management (AMB) (Signed)
Chronic Care Management    Social Work Follow Up Note  11/03/2019 Name: [name] MRN: [id number] DOB: 1956-02-15  [name] is a 64 y.o. year old male who is a primary care patient of [name], [name]. The CCM team was consulted for assistance with care coordination.   Review of patient status, including review of consultants reports, other relevant assessments, and collaboration with appropriate care team members and the patient's provider was performed as part of comprehensive patient evaluation and provision of chronic care management services.    SDOH (Social Determinants of Health) assessments performed: No    Outpatient Encounter Medications as of 11/03/2019  Medication Sig   acetaminophen (TYLENOL) 500 MG tablet Take 500 mg by mouth every 6 (six) hours as needed for mild pain or moderate pain.   aspirin EC 81 MG tablet Take 81 mg by mouth daily.   atorvastatin (LIPITOR) 20 MG tablet TAKE 1 TABLET BY MOUTH EVERY DAY   fluconazole (DIFLUCAN) 100 MG tablet Take 1 tablet (100 mg total) by mouth daily. Take 1 tablet by mouth now repeat in 5 days   guaiFENesin (MUCINEX) 600 MG 12 hr tablet Take 1 tablet (600 mg total) by mouth 2 (two) times daily.   HYDROcodone-acetaminophen (NORCO) 5-325 MG tablet Take 1 tablet by mouth every 6 (six) hours as needed for moderate pain.   metoprolol tartrate (LOPRESSOR) 50 MG tablet TAKE 1 TABLET BY MOUTH EVERY DAY   Multiple Vitamin (MULTIVITAMIN) tablet Take 1 tablet by mouth daily.   MYRBETRIQ 25 MG TB24 tablet TAKE 1 TABLET BY MOUTH EVERY DAY   nystatin (NYSTATIN) powder Apply 1 application topically 3 (three) times daily.   RYBELSUS 14 MG TABS TAKE 1 TABLET BY MOUTH DAILY. 30 MINUTES BEFORE BREAKFAST   sildenafil (REVATIO) 20 MG tablet TAKE 1 TABLET BY MOUTH EVERY DAY (PRIOR AUTHORIZATION DENIED)   TRADJENTA 5 MG TABS tablet TAKE 1 TABLET BY MOUTH EVERY DAY   traMADol (ULTRAM) 50 MG tablet Take 1 tablet (50 mg total) by mouth  every 6 (six) hours as needed for severe pain.   traZODone (DESYREL) 50 MG tablet TAKE 1 TABLET BY MOUTH EVERYDAY AT BEDTIME   No facility-administered encounter medications on file as of 11/03/2019.     Goals Addressed              This Visit's Progress     Patient Stated     "I need help getting into an assisted living facility" (pt-stated)        [facility] (see longitudinal plan of care for additional care plan information)  Current Barriers:   Knowledge Deficits related to process for admission into an ALF  Chronic Disease Management support and education needs related to CKD III, DM II, HTN  Nurse Case Manager Clinical Goal(s):   Over the next 90 days, patient will work with embedded BSW and PCP to address needs related to assistance for admission into an ALF  CCM SW Interventions: Completed 11/03/19  Successful outbound call placed to the patient to advise no other bed offers have been made at this time  Discussed opportunity to stay in the home and await a bed a [facility], which the patient previously identified as his top choice o The patient reports he is not sure if he would like to wait on placement at [place] patient indicates he is interested in [place]  Successful outbound call placed to [employer]  of [place] with [facility] who confirms SA beds are still available o Unfortunately, tours are not permitted at this time in person but [name] reports she is happy to participate in a zoom call o Provided [name] with the patients contact number to discuss a zoom tour  Scheduled follow up call over the next week to assess goal progression  Completed 10/29/19  Collaboration with patient primary provider who reports one dose of Moderna is approximately 80% effective and recommendation for patient to receive second Moderna dose of COVID 19 vaccine to better protect the patient prior to  placement  Successful outbound call placed to the patient to relay information including approximate efficacy and recommendation for the patient to receive the second dose prior to placement  Determined the patient is not considering a second dose at this time due to side effects from first vaccine and the concern that two doses are not 100% effective in preventing COVID 19 infection o SW discussed the patient's reaction may have been due to vaccine and/or the COVID 19 infection the patient contracted shortly after vaccination o Advised the patient that due to plans to move into an ALF, risk of infection would be higher in communal living due to inability to social distance  Advised the patient SW is unclear if an ALF will allow placement with only one dose of the vaccine o Patient reported understanding and still reports plan to decline second dose at this time  Discussed the patient still only has two current bed offers from [facility] and [place] o The patient is choosing to decline both bed offers due to star rating and neighborhood facilities are located in o The patient reports he is on a waiting list for [facility] which is his first choice  Scheduled follow up call over the next week to reach out to communities to confirm receipt of Fl2 and inquire bed availability  Collaboration with Randy Brine, [name] and [name] to inform on plan  Completed 10/27/19  Successful outbound call placed to the patient to review bed offers  Discussed patient has received a bed offer at both [place] and [place] the patient is interested in contacting [place] to request more information about their community o Provided the patient with contact information  Discussed the patient has yet to receive second dose of COVID vaccine and is not sure he will based on receiving one dose and recovering from  [name] he feels a second dose is not needed o Advised the patient SW is unable to confirm placement will be available without him being fully vaccinated o Collaboration with Randy Brine, [name] for further guidance  Collaboration with [place] to discuss patient current bed offers o Discussed concern surrounding star ratings of both facilities  Successful outbound call placed to the patient to review star ratings and deficiencies on last county monitoring visit o Determined the patient prefers to move into an ALF that is 3 stars or greater o The patient reports he is currently on the wait list at [place] patient states he is not interested in [place] due to neighborhood the community is in  SW will follow up with the patient over the next week  CCM RN CM Interventions:   Inter-disciplinary care team collaboration (see longitudinal plan of care)  Determined patient would like to move into an ALF  and would like assistance from the embedded BSW and PCP  Collaborated with embedded [place] regarding patient's request for assistance with admission into an ALF  Discussed plans with patient for ongoing care management follow up and provided patient with direct contact information for care management team  Patient Self Care Activities:   Self administers medications as prescribed  Attends all scheduled provider appointments  Calls pharmacy for medication refills  Calls provider office for new concerns or questions  Please see past updates related to this goal by clicking on the "Past Updates" button in the selected goal          Follow Up Plan: SW will follow up with patient by phone over the next week.   [name], BSW, CDP Social Worker, Certified Dementia Practitioner [place] [facility] [place] [facility] [phone]  Total time spent performing care coordination and/or care management activities with the patient by phone or face to  face = 14 minutes.

## 2019-11-04 ENCOUNTER — Telehealth: Payer: Medicare Other

## 2019-11-05 ENCOUNTER — Other Ambulatory Visit: Payer: Self-pay

## 2019-11-05 ENCOUNTER — Ambulatory Visit: Payer: Medicare Other

## 2019-11-05 DIAGNOSIS — E1121 Type 2 diabetes mellitus with diabetic nephropathy: Secondary | ICD-10-CM

## 2019-11-05 DIAGNOSIS — I10 Essential (primary) hypertension: Secondary | ICD-10-CM

## 2019-11-05 DIAGNOSIS — N130 Hydronephrosis with ureteropelvic junction obstruction: Secondary | ICD-10-CM | POA: Diagnosis not present

## 2019-11-05 NOTE — Chronic Care Management (AMB) (Signed)
Chronic Care Management Pharmacy  Name: [name]  MRN: [id number] DOB: 27-Dec-1955  Chief Complaint/ HPI  [name],  64 y.o. , male presents for their Follow-Up CCM visit with the clinical pharmacist via telephone due to COVID-19 Pandemic. Today's call was cut short due to another appointment patient had. Will contact follow up again in 2 weeks.  PCP : Randy Brine, [name]  Their chronic conditions include: Hypertension, Diabetes, CKD, Dyslipidemia, Depressive disorder, Insomnia   Office Visits: 10/07/19 TV: Pt recently discharged from hospital after COVID pneumonia. Also found to have moderate to severe hydronephrosis, but refused nephrostomy tube. Ordered PT for strength and endurance. Have remote health contact pt. Recheck chest Xray August 9th. Advised pt to contact Urologist. Provided limited amount of Norco for right flank pain. Encouraged to stay well hydrated.   08/26/19 AWV and OV: Presented for DM follow up. Pt reported rash to right groin area. Pt tolerating Rybelsus and Tradjenta well. Labs ordered (HgbA1c). Pt encouraged to stay well hydrated and avoid NSAIDs. Sildenafil refilled. Started nystatin powder and oral fluconazole 100mg  daily for 5 days for erythema present to groin area. Referred to [facility] for second opinion regarding prostate cancer.   05/05/19 OV: Follow up diabetic visit. Labs ordered (HgbA1c, Lipid panel, CMP14+EGFR). Recommended to remain hydrated to help kidney function. Diet and exercise education provided. Started sildenafil 20mg  daily for erectile dysfunction.  03/25/19 Telemed visit: Presented for wheezing/non-productive cough over the past week. Started Mucinex as needed for chest congestion  02/02/19 OV: Follow up diabetic visit. BG was 226 in office. Diabetes is poorly controlled, started on Rybelsus 7mg  daily. Labs ordered (HgbA1c, CMP14+EGFR). Referred to CCM RN for diabetes education. Stage 3b CKD  Consult Visits: 09/28/19-10/05/19 ED  admission: Presented with weakness, nausea and vomiting for about 1 week. Found to have mild/moderate COVID/pneumonia. Completed steroids and Remdesivir course. Discharged symptom free. Pt refused nephrostomy tube placement in hospital. F/U with PCP and Urologist in 7 days.   09/07/19 Dermatology OV w/ Dr. Shaaron Black: Presented as new pt for itching and burning on chest/back/groin. Pt has been going to tanning bed TIW which he thinks has helped. Used gold bond powder on groin area. Uses Ivory soap and Tide Free and Clear detergent. Decrease light therapy to 5 minutes since pt is very tan. Started gabapentin 100mg  at bedtime for itching and burning.  05/12/19 Telephone call: Sildenafil PA was denied by the insurance  05/11/19 OV w/ Urology w/ Dr. 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 Restart/ Continue alfuzosin 10mg  daily. PSA (13.03 H)and urine culture ordered. Follow up in 4 months.Previously on Rapaflo for LUTS. Myrbetriq started for urgency and urge incontinence.   CCM Encounters: 11/03/19 SW: No bed offers have been made at this time. Advised pt he could stay in home and wait for bed at [facility]. Pt states interested in [facility]. SW contact [facility], not currently doing tours in person, but willing to do a zoom call with patient.   10/29/19 SW: Per PCP, recommend pt get 2nd dose of Moderna COVID vaccine; pt states he is not considering at this time due to side effects from the first shot. ALF may not allow placement if not fully vaccinated. Pt declined bed offer from [facility] and [facility] right now. On waiting list for [facility] (1st choice).  10/27/19 SW: Reviewed current bed offers with [name] [place] [facility])  09/24/19 SW:  Contacted pt to notify of paperwork sent to provider for [facility] placement. Pt states he no longer wants to move into [place]. Pt would like to  consider other ALFs.   09/22/19 RN and SW: Pt reported nausea, vomiting and fatigue for past 5 days after receiving first COVID vaccine. Collaborate/notify PCP. Outreach to [facility] assisted living facility to discuss steps for placement. Collaborate with PCP for completion of paperwork.   09/13/19 RN: Evaluated current treatment plans with patient. Provided pt education  05/10/19 PharmD: Encouraged pt to check BG at home. Metformin was d/c by nephrologist due to kidney function. Counseled pt on correct administration of Rybelsus.   04/22/19 RN: Care plan established for chronic disease states.   03/05/19 SW: CCM consent obtained  Medications: Outpatient Encounter Medications as of 11/05/2019  Medication Sig  . alfuzosin (UROXATRAL) 10 MG 24 hr tablet Take 10 mg by mouth every morning.  Marland Kitchen atorvastatin (LIPITOR) 20 MG tablet TAKE 1 TABLET BY MOUTH EVERY DAY  . metoprolol tartrate (LOPRESSOR) 50 MG tablet TAKE 1 TABLET BY MOUTH EVERY DAY  . MYRBETRIQ 25 MG TB24 tablet TAKE 1 TABLET BY MOUTH EVERY DAY  . olmesartan-hydrochlorothiazide (BENICAR HCT) 40-25 MG tablet Take 1 tablet by mouth daily.  . RYBELSUS 14 MG TABS TAKE 1 TABLET BY MOUTH DAILY. 30 MINUTES BEFORE BREAKFAST  . sildenafil (REVATIO) 20 MG tablet TAKE 1 TABLET BY MOUTH EVERY DAY (PRIOR AUTHORIZATION DENIED)  . TRADJENTA 5 MG TABS tablet TAKE 1 TABLET BY MOUTH EVERY DAY  . traZODone (DESYREL) 50 MG tablet TAKE 1 TABLET BY MOUTH EVERYDAY AT BEDTIME  . acetaminophen (TYLENOL) 500 MG tablet Take 500 mg by mouth every 6 (six) hours as needed for mild pain or moderate pain.  Marland Kitchen aspirin EC 81 MG tablet Take 81 mg by mouth daily.  . fluconazole (DIFLUCAN) 100 MG tablet Take 1 tablet (100 mg total) by mouth daily. Take 1 tablet by mouth now repeat in 5 days  . gabapentin (NEURONTIN) 100 MG capsule Take 100 mg by mouth at bedtime.  Marland Kitchen guaiFENesin (MUCINEX) 600 MG 12 hr tablet Take 1 tablet (600 mg total) by mouth 2 (two) times daily.  Marland Kitchen  HYDROcodone-acetaminophen (NORCO) 5-325 MG tablet Take 1 tablet by mouth every 6 (six) hours as needed for moderate pain.  . Multiple Vitamin (MULTIVITAMIN) tablet Take 1 tablet by mouth daily.  Marland Kitchen nystatin (NYSTATIN) powder Apply 1 application topically 3 (three) times daily.  . traMADol (ULTRAM) 50 MG tablet Take 1 tablet (50 mg total) by mouth every 6 (six) hours as needed for severe pain.   No facility-administered encounter medications on file as of 11/05/2019.   Current Diagnosis/Assessment: SDOH Interventions     Most Recent Value  SDOH Interventions  Financial Strain Interventions Intervention Not Indicated      Goals Addressed            This Visit's Progress   . Pharmacy Care Plan       CARE PLAN ENTRY  Current Barriers:  . Chronic Disease Management support, education, and care coordination needs related to Hypertension, Hyperlipidemia, Diabetes, and Chronic Kidney Disease   Hypertension . Pharmacist Clinical Goal(s): o Over the next 90 days, patient will work with PharmD and providers to maintain BP goal <130/80 . Current regimen:  o Metoprolol tartrate 50mg  daily o Olmesartan/ HCTZ 40/25mg  daily . Interventions: o Provided dietary and exercise recommendations . Patient self care activities - Over the next 90 days, patient will: o Check  BP weekly, document, and provide at future appointments o Ensure daily salt intake < 2300 mg/day o Choose low-sodium/no-sodium canned goods o Exercise 30 minutes 5 times weekly  Diabetes . Pharmacist Clinical Goal(s): o Over the next 90 days, patient will work with PharmD and providers to achieve A1c goal <7% . Current regimen:  o Rybelsus 14mg  daily 30 minutes before breakfast o Tradjenta 5mg  daily . Interventions: o Provided dietary and exercise recommendations o Advised patient to check blood sugar first thing in the morning before breakfast . Patient self care activities - Over the next 90 days, patient will: o Check  blood sugar once daily, document, and provide at future appointments o Contact provider with any episodes of hypoglycemia o Exercise 30 minutes 5 times weekly  Hyperlipidemia Lab Results  Component Value Date/Time   LDLCALC 106 (H) 08/26/2019 03:42 PM   . Pharmacist Clinical Goal(s): o Over the next 90 days, patient will work with PharmD and providers to achieve LDL goal < 70 . Current regimen:  o Atorvastatin 20mg  daily . Interventions: o Provided dietary and exercise recommendations o Will discuss increasing atorvastatin 20mg  to atorvastatin 40mg  daily with PCP due to LDL >100 and ASCVD risk . Patient self care activities - Over the next 90 days, patient will: o Take cholesterol medication daily as directed o Exercise 30 minutes 5 times weekly  Medication management . Pharmacist Clinical Goal(s): o Over the next 90 days, patient will work with PharmD and providers to achieve optimal medication adherence . [facility] . Interventions o Comprehensive medication review performed. o Continue current medication management strategy o Call cut short, will follow up in 2 weeks . Patient self care activities - Over the next 90 days, patient will: o Focus on medication adherence by using pill box o Consider medication synchronization and pill packaging program as outlined be PharmD o Take medications as prescribed o Report any questions or concerns to PharmD and/or provider(s)  Please see past updates related to this goal by clicking on the "Past Updates" button in the selected goal         Diabetes   Recent Relevant Labs: Lab Results  Component Value Date/Time   HGBA1C 7.8 (H) 08/26/2019 03:42 PM   HGBA1C 9.2 (H) 05/05/2019 10:28 AM   HGBA1C 7.6 06/26/2016 12:00 AM   HGBA1C 7.6 06/26/2016 12:00 AM   MICROALBUR 80 08/26/2019 03:15 PM   MICROALBUR 150 03/26/2018 11:57 AM   Kidney Function Lab Results  Component Value Date/Time   CREATININE 1.69 (H) 10/05/2019  02:09 AM   CREATININE 1.75 (H) 10/04/2019 04:26 AM   GFRNONAA 42 (L) 10/05/2019 02:09 AM   GFRAA 48 (L) 10/05/2019 02:09 AM  Stage 3b CKD   Checking BG: Every other day  Recent Random BG Readings: 120-160 Patient has failed these meds in past: Metformin Patient is currently uncontrolled but improved on the following medications:   Rybelsus 14mg  daily 30 minutes before breakfast  Tradjenta 5mg  daily  Last diabetic Foot exam: Not on file Last diabetic Eye exam: 06/17/19, no retinopathy, macular atrophy  We discussed:   Pt back on Rybelsus and Tradjenta after hospital discharge  Doesn't check BG every day, but checks at various times during the day  No BG to provide today, due to not being at home, but provided a range  Diet extensively  Has cut way back on sweets  Pt HgbA1c improved on Tradjenta and Rybelsus  Take BG first thing in the morning before eating  Call cut short due to pt having an another appointment  Plan Continue current medications  Discuss BG log, diet, and exercise in more depth at follow up  Hypertension   Office blood pressures are  BP Readings from Last 3 Encounters:  10/05/19 120/71  08/26/19 132/90  08/26/19 132/90   Patient has failed these meds in the past: Amlodipine, diltiazem ER, losartan/HCTZ, Metoprolol succinate, olmesartan Patient is currently controlled on the following medications:   Metoprolol tartrate 50mg  daily  Olmesartan/ HCTZ 40/25mg  daily  Patient checks BP at home infrequently  Patient home BP readings are ranging: 110/82 (last time it was taken by home health aide)  We discussed:  Pt says he does not have any trouble with his blood pressure now  Plan Continue current medications   Hyperlipidemia   LDL goal <70  Lipid Panel     Component Value Date/Time   CHOL 187 08/26/2019 1542   TRIG 270 (H) 08/26/2019 1542   HDL 34 (L) 08/26/2019 1542   CHOLHDL 5.5 (H) 08/26/2019 1542   CHOLHDL 6.2 04/19/2008 0430     VLDL 26 04/19/2008 0430   LDLCALC 106 (H) 08/26/2019 1542   LABVLDL 47 (H) 08/26/2019 1542     The 10-year ASCVD risk score Mikey Bussing DC Jr., et al., 2013) is: 31%   Values used to calculate the score:     Age: 75 years     Sex: Male     Is Non-Hispanic African American: No     Diabetic: Yes     Tobacco smoker: No     Systolic Blood Pressure: 213 mmHg     Is BP treated: Yes     HDL Cholesterol: 34 mg/dL     Total Cholesterol: 187 mg/dL   Patient has failed these meds in past: N/A Patient is currently uncontrolled on the following medications:   Atorvastatin 20mg  daily  Aspirin 81 mg daily  Plan Continue current medications  Discuss cholesterol at next follow up visit Consider increasing atorvastatin to 40mg  daily to help reduce LDL  Insomnia   Patient has failed these meds in past: Zolpidem Patient is currently controlled on the following medications:   Trazodone 50mg  daily at bedtime  We discussed:   Has been sleeping pretty good  Only takes trazodone as needed when he feels restlessness  Plan Continue current medications   BPH   Patient has failed these meds in past: Flomax Patient is currently controlled on the following medications:   Alfuzosin 10mg  every morning  We discussed:    Prescribed by urologist  Plan Continue current medications  Neuropathy   Patient has failed these meds in past: N/A Patient is currently controlled on the following medications:   Gabapentin 100mg  at bedtime  Plan Continue current medications  Overactive Bladder    Patient has failed these meds in past: N/A Patient is currently controlled on the following medications:   Myrbetriq 25mg  daily  We discussed:   Myrbetriq is helping. Bladder is able to get full before he goes to the bathroom. Doesn't have to go frequently anymore.   Plan Continue current medications   CKD   Kidney Function Lab Results  Component Value Date/Time   CREATININE 1.69 (H)  10/05/2019 02:09 AM   CREATININE 1.75 (H) 10/04/2019 04:26 AM   GFRNONAA 42 (L) 10/05/2019 02:09 AM   GFRAA 48 (L) 10/05/2019 02:09 AM   K 4.6 10/05/2019 02:09 AM   K 5.0 10/04/2019 04:26 AM   Patient has failed these  meds in past:  Patient is currently uncontrolled on the following medications: N/A  We discussed:  Pt declined nephrostomy in hospital, wanted to see his regular nephrologist  Pt has appointment with urologist today  Pt states that he has 15% function in right kidney, but that doctors did not   Pt states that left kidney is doing good, but right kidney function is minimal  Pt states that plan is not to perform surgery due to minimal benefit and pt can live with only 1 kidney  Pt is limiting coffee to a couple of cups as week as recommended by his doctor  Plan Continue care with Nephrologist  Vaccines   Reviewed and discussed patient's vaccination history.    Immunization History  Administered Date(s) Administered  . Influenza Split 12/04/2009  . PPD Test 05/29/2016   We discussed:  Pt has only had 1 dose of COVID vaccine and soon after got sick with COVID and was hospitalized  Pt mentioned he thought he might have caught COVID from the vaccine but that his doctors told him this was not possible  Reaffirmed that COVID vaccine did not give pt COVID  Plan Discuss at follow up visit  Medication Management   [facility] Uses pill box? No - Pt has them organized in cabinet and that works for him Pt endorses 100% compliance  We discussed:   Importance of medication adherence  Plan Continue current medication management strategy  Review new insurance and pharmacy preferred status, pt would benefit from adherence packaging  Follow up: 2 week phone visit  Jannette Fogo, PharmD Clinical Pharmacist [facility] [phone]

## 2019-11-05 NOTE — Patient Instructions (Addendum)
Visit Information  Goals Addressed            This Visit's Progress   . Pharmacy Care Plan       CARE PLAN ENTRY  Current Barriers:  . Chronic Disease Management support, education, and care coordination needs related to Hypertension, Hyperlipidemia, Diabetes, and Chronic Kidney Disease   Hypertension . Pharmacist Clinical Goal(s): o Over the next 90 days, patient will work with PharmD and providers to maintain BP goal <130/80 . Current regimen:  o Metoprolol tartrate 50mg  daily o Olmesartan/ HCTZ 40/25mg  daily . Interventions: o Provided dietary and exercise recommendations . Patient self care activities - Over the next 90 days, patient will: o Check BP weekly, document, and provide at future appointments o Ensure daily salt intake < 2300 mg/day o Choose low-sodium/no-sodium canned goods o Exercise 30 minutes 5 times weekly  Diabetes . Pharmacist Clinical Goal(s): o Over the next 90 days, patient will work with PharmD and providers to achieve A1c goal <7% . Current regimen:  o Rybelsus 14mg  daily 30 minutes before breakfast o Tradjenta 5mg  daily . Interventions: o Provided dietary and exercise recommendations o Advised patient to check blood sugar first thing in the morning before breakfast . Patient self care activities - Over the next 90 days, patient will: o Check blood sugar once daily, document, and provide at future appointments o Contact provider with any episodes of hypoglycemia o Exercise 30 minutes 5 times weekly  Hyperlipidemia Lab Results  Component Value Date/Time   LDLCALC 106 (H) 08/26/2019 03:42 PM   . Pharmacist Clinical Goal(s): o Over the next 90 days, patient will work with PharmD and providers to achieve LDL goal < 70 . Current regimen:  o Atorvastatin 20mg  daily . Interventions: o Provided dietary and exercise recommendations o Will discuss increasing atorvastatin 20mg  to atorvastatin 40mg  daily with PCP due to LDL >100 and ASCVD  risk . Patient self care activities - Over the next 90 days, patient will: o Take cholesterol medication daily as directed o Exercise 30 minutes 5 times weekly  Medication management . Pharmacist Clinical Goal(s): o Over the next 90 days, patient will work with PharmD and providers to achieve optimal medication adherence . Current pharmacy: [facility] . Interventions o Comprehensive medication review performed. o Continue current medication management strategy o Call cut short, will follow up in 2 weeks . Patient self care activities - Over the next 90 days, patient will: o Focus on medication adherence by using pill box o Consider medication synchronization and pill packaging program as outlined be PharmD o Take medications as prescribed o Report any questions or concerns to PharmD and/or provider(s)  Please see past updates related to this goal by clicking on the "Past Updates" button in the selected goal         The patient verbalized understanding of instructions provided today and agreed to receive a mailed copy of patient instruction and/or educational materials.  Telephone follow up appointment with pharmacy team member scheduled for: 11/19/19 @ 11:30 AM  Jannette Fogo, PharmD Clinical Pharmacist [facility] [phone]   Diabetes Mellitus and Nutrition, Adult When you have diabetes (diabetes mellitus), it is very important to have healthy eating habits because your blood sugar (glucose) levels are greatly affected by what you eat and drink. Eating healthy foods in the appropriate amounts, at about the same times every day, can help you:  Control your blood glucose.  Lower your risk of heart disease.  Improve your blood pressure.  Reach or maintain a healthy weight. Every person with diabetes is different, and each person has different needs for a meal plan. Your health care provider may recommend that you work with a diet and nutrition specialist  (dietitian) to make a meal plan that is best for you. Your meal plan may vary depending on factors such as:  The calories you need.  The medicines you take.  Your weight.  Your blood glucose, blood pressure, and cholesterol levels.  Your activity level.  Other health conditions you have, such as heart or kidney disease. How do carbohydrates affect me? Carbohydrates, also called carbs, affect your blood glucose level more than any other type of food. Eating carbs naturally raises the amount of glucose in your blood. Carb counting is a method for keeping track of how many carbs you eat. Counting carbs is important to keep your blood glucose at a healthy level, especially if you use insulin or take certain oral diabetes medicines. It is important to know how many carbs you can safely have in each meal. This is different for every person. Your dietitian can help you calculate how many carbs you should have at each meal and for each snack. Foods that contain carbs include:  Bread, cereal, rice, pasta, and crackers.  Potatoes and corn.  Peas, beans, and lentils.  Milk and yogurt.  Fruit and juice.  Desserts, such as cakes, cookies, ice cream, and candy. How does alcohol affect me? Alcohol can cause a sudden decrease in blood glucose (hypoglycemia), especially if you use insulin or take certain oral diabetes medicines. Hypoglycemia can be a life-threatening condition. Symptoms of hypoglycemia (sleepiness, dizziness, and confusion) are similar to symptoms of having too much alcohol. If your health care provider says that alcohol is safe for you, follow these guidelines:  Limit alcohol intake to no more than 1 drink per day for nonpregnant women and 2 drinks per day for men. One drink equals 12 oz of beer, 5 oz of wine, or 1 oz of hard liquor.  Do not drink on an empty stomach.  Keep yourself hydrated with water, diet soda, or unsweetened iced tea.  Keep in mind that regular soda,  juice, and other mixers may contain a lot of sugar and must be counted as carbs. What are tips for following this plan?  Reading food labels  Start by checking the serving size on the "Nutrition Facts" label of packaged foods and drinks. The amount of calories, carbs, fats, and other nutrients listed on the label is based on one serving of the item. Many items contain more than one serving per package.  Check the total grams (g) of carbs in one serving. You can calculate the number of servings of carbs in one serving by dividing the total carbs by 15. For example, if a food has 30 g of total carbs, it would be equal to 2 servings of carbs.  Check the number of grams (g) of saturated and trans fats in one serving. Choose foods that have low or no amount of these fats.  Check the number of milligrams (mg) of salt (sodium) in one serving. Most people should limit total sodium intake to less than 2,300 mg per day.  Always check the nutrition information of foods labeled as "low-fat" or "nonfat". These foods may be higher in added sugar or refined carbs and should be avoided.  Talk to your dietitian to identify your daily goals for nutrients listed on the label. Shopping  Avoid  buying canned, premade, or processed foods. These foods tend to be high in fat, sodium, and added sugar.  Shop around the outside edge of the grocery store. This includes fresh fruits and vegetables, bulk grains, fresh meats, and fresh dairy. Cooking  Use low-heat cooking methods, such as baking, instead of high-heat cooking methods like deep frying.  Cook using healthy oils, such as olive, canola, or sunflower oil.  Avoid cooking with butter, cream, or high-fat meats. Meal planning  Eat meals and snacks regularly, preferably at the same times every day. Avoid going long periods of time without eating.  Eat foods high in fiber, such as fresh fruits, vegetables, beans, and whole grains. Talk to your dietitian about  how many servings of carbs you can eat at each meal.  Eat 4-6 ounces (oz) of lean protein each day, such as lean meat, chicken, fish, eggs, or tofu. One oz of lean protein is equal to: ? 1 oz of meat, chicken, or fish. ? 1 egg. ?  cup of tofu.  Eat some foods each day that contain healthy fats, such as avocado, nuts, seeds, and fish. Lifestyle  Check your blood glucose regularly.  Exercise regularly as told by your health care provider. This may include: ? 150 minutes of moderate-intensity or vigorous-intensity exercise each week. This could be brisk walking, biking, or water aerobics. ? Stretching and doing strength exercises, such as yoga or weightlifting, at least 2 times a week.  Take medicines as told by your health care provider.  Do not use any products that contain nicotine or tobacco, such as cigarettes and e-cigarettes. If you need help quitting, ask your health care provider.  Work with a Social worker or diabetes educator to identify strategies to manage stress and any emotional and social challenges. Questions to ask a health care provider  Do I need to meet with a diabetes educator?  Do I need to meet with a dietitian?  What number can I call if I have questions?  When are the best times to check my blood glucose? Where to find more information:  American Diabetes Association: diabetes.org  Academy of Nutrition and Dietetics: www.eatright.[employer] of Diabetes and Digestive and Kidney Diseases (NIH): [employer] Summary  A healthy meal plan will help you control your blood glucose and maintain a healthy lifestyle.  Working with a diet and nutrition specialist (dietitian) can help you make a meal plan that is best for you.  Keep in mind that carbohydrates (carbs) and alcohol have immediate effects on your blood glucose levels. It is important to count carbs and to use alcohol carefully. This information is not intended to replace advice given  to you by your health care provider. Make sure you discuss any questions you have with your health care provider. Document Revised: 02/14/2017 Document Reviewed: 04/08/2016 Elsevier Patient Education  2020 [employer].

## 2019-11-09 DIAGNOSIS — E1140 Type 2 diabetes mellitus with diabetic neuropathy, unspecified: Secondary | ICD-10-CM | POA: Diagnosis not present

## 2019-11-09 DIAGNOSIS — E1122 Type 2 diabetes mellitus with diabetic chronic kidney disease: Secondary | ICD-10-CM | POA: Diagnosis not present

## 2019-11-09 DIAGNOSIS — E785 Hyperlipidemia, unspecified: Secondary | ICD-10-CM | POA: Diagnosis not present

## 2019-11-09 DIAGNOSIS — K5900 Constipation, unspecified: Secondary | ICD-10-CM | POA: Diagnosis not present

## 2019-11-09 DIAGNOSIS — E1169 Type 2 diabetes mellitus with other specified complication: Secondary | ICD-10-CM | POA: Diagnosis not present

## 2019-11-09 DIAGNOSIS — Z79899 Other long term (current) drug therapy: Secondary | ICD-10-CM | POA: Diagnosis not present

## 2019-11-09 DIAGNOSIS — U071 COVID-19: Secondary | ICD-10-CM | POA: Diagnosis not present

## 2019-11-09 DIAGNOSIS — K219 Gastro-esophageal reflux disease without esophagitis: Secondary | ICD-10-CM | POA: Diagnosis not present

## 2019-11-09 DIAGNOSIS — N1832 Chronic kidney disease, stage 3b: Secondary | ICD-10-CM | POA: Diagnosis not present

## 2019-11-09 DIAGNOSIS — I129 Hypertensive chronic kidney disease with stage 1 through stage 4 chronic kidney disease, or unspecified chronic kidney disease: Secondary | ICD-10-CM | POA: Diagnosis not present

## 2019-11-09 DIAGNOSIS — E871 Hypo-osmolality and hyponatremia: Secondary | ICD-10-CM | POA: Diagnosis not present

## 2019-11-09 DIAGNOSIS — M109 Gout, unspecified: Secondary | ICD-10-CM | POA: Diagnosis not present

## 2019-11-09 DIAGNOSIS — D509 Iron deficiency anemia, unspecified: Secondary | ICD-10-CM | POA: Diagnosis not present

## 2019-11-09 DIAGNOSIS — J1282 Pneumonia due to coronavirus disease 2019: Secondary | ICD-10-CM | POA: Diagnosis not present

## 2019-11-10 ENCOUNTER — Other Ambulatory Visit: Payer: Self-pay | Admitting: Nurse Practitioner

## 2019-11-10 ENCOUNTER — Ambulatory Visit: Payer: Medicare Other

## 2019-11-10 DIAGNOSIS — N1832 Chronic kidney disease, stage 3b: Secondary | ICD-10-CM

## 2019-11-10 DIAGNOSIS — E1121 Type 2 diabetes mellitus with diabetic nephropathy: Secondary | ICD-10-CM

## 2019-11-10 DIAGNOSIS — N529 Male erectile dysfunction, unspecified: Secondary | ICD-10-CM

## 2019-11-10 DIAGNOSIS — I10 Essential (primary) hypertension: Secondary | ICD-10-CM

## 2019-11-10 NOTE — Patient Instructions (Signed)
Social Worker Visit Information  Goals we discussed today:  Goals Addressed              This Visit's Progress     Patient Stated   .  "I need help getting into an assisted living facility" (pt-stated)   On track     [facility] (see longitudinal plan of care for additional care plan information)  Current Barriers:  Marland Kitchen Knowledge Deficits related to process for admission into an ALF . Chronic Disease Management support and education needs related to CKD III, DM II, HTN  Nurse Case Manager Clinical Goal(s):  Marland Kitchen Over the next 90 days, patient will work with embedded BSW and PCP to address needs related to assistance for admission into an ALF  CCM SW Interventions: Completed 11/10/19 . Successful outbound call placed to the patient to assess goal progression . Determined the patient has been in contact with [facility] and was informed he may enter the facility to complete a tour o The patient reports his back is painful this week bu the hopes to visit the community over the next week to complete the tour . Scheduled follow up call over the next two weeks to assess goal progression  Completed 11/03/19 . Successful outbound call placed to the patient to advise no other bed offers have been made at this time . Discussed opportunity to stay in the home and await a bed a [facility], which the patient previously identified as his top choice o The patient reports he is not sure if he would like to wait on placement at [facility] patient indicates he is interested in [place] . Successful outbound call placed to [place] with Joycelyn Schmid who confirms SA beds are still available o Unfortunately, tours are not permitted at this time in person but [name] reports she is happy to participate in a zoom call o Provided [name] with the patients contact number to discuss a zoom tour . Scheduled follow  up call over the next week to assess goal progression  Completed 10/29/19 . Collaboration with patient primary provider who reports one dose of Moderna is approximately 80% effective and recommendation for patient to receive second Moderna dose of COVID 19 vaccine to better protect the patient prior to placement . Successful outbound call placed to the patient to relay information including approximate efficacy and recommendation for the patient to receive the second dose prior to placement . Determined the patient is not considering a second dose at this time due to side effects from first vaccine and the concern that two doses are not 100% effective in preventing COVID 19 infection o SW discussed the patient's reaction may have been due to vaccine and/or the COVID 19 infection the patient contracted shortly after vaccination o Advised the patient that due to plans to move into an ALF, risk of infection would be higher in communal living due to inability to social distance . Advised the patient SW is unclear if an ALF will allow placement with only one dose of the vaccine o Patient reported understanding and still reports plan to decline second dose at this time . Discussed the patient still only has two current bed offers from [facility] and [facility] o The patient is choosing to decline both bed offers due to star rating and neighborhood facilities are located in o The patient reports he is on  a waiting list for [facility] which is his first choice . Scheduled follow up call over the next week to reach out to communities to confirm receipt of [facility] and inquire bed availability . Collaboration with [name], [name] and [place] to inform on plan  Completed 10/27/19 . Successful outbound call placed to the patient to review bed offers . Discussed patient has received a bed offer at both [facility] and [facility] . Determined the  patient is interested in contacting [place] to request more information about their community o Provided the patient with contact information . Discussed the patient has yet to receive second dose of COVID vaccine and is not sure he will based on receiving one dose and recovering from [place] he feels a second dose is not needed o Advised the patient SW is unable to confirm placement will be available without him being fully vaccinated o Collaboration with [name], [name] for further guidance . Collaboration with RN Care Manager [place] to discuss patient current bed offers o Discussed concern surrounding star ratings of both facilities . Successful outbound call placed to the patient to review star ratings and deficiencies on last county monitoring visit o Determined the patient prefers to move into an ALF that is 3 stars or greater o The patient reports he is currently on the wait list at [facility] patient states he is not interested in [facility] due to neighborhood the community is in . SW will follow up with the patient over the next week  CCM RN CM Interventions:  . Inter-disciplinary care team collaboration (see longitudinal plan of care) . Determined patient would like to move into an ALF and would like assistance from the embedded BSW and PCP . Collaborated with embedded BSW [name] regarding patient's request for assistance with admission into an ALF . Discussed plans with patient for ongoing care management follow up and provided patient with direct contact information for care management team  Patient Self Care Activities:  . Self administers medications as prescribed . Attends all scheduled provider appointments . Calls pharmacy for medication refills . Calls provider office for new concerns or questions  Please see past updates related to this goal by clicking on the "Past Updates" button in the selected goal          Follow  Up Plan: SW will follow up with patient by phone over the next two weeks.   [name], BSW, CDP Social Worker, Certified Dementia Practitioner [place] [facility] [place] Management [phone]

## 2019-11-10 NOTE — Chronic Care Management (AMB) (Signed)
Chronic Care Management    Social Work Follow Up Note  11/10/2019 Name: [name] MRN: [id number] DOB: Oct 18, 1955  [name] is a 64 y.o. year old male who is a primary care patient of [name], [place]. The CCM team was consulted for assistance with care coordination.   Review of patient status, including review of consultants reports, other relevant assessments, and collaboration with appropriate care team members and the patient's provider was performed as part of comprehensive patient evaluation and provision of chronic care management services.    SDOH (Social Determinants of Health) assessments performed: No    Outpatient Encounter Medications as of 11/10/2019  Medication Sig  . acetaminophen (TYLENOL) 500 MG tablet Take 500 mg by mouth every 6 (six) hours as needed for mild pain or moderate pain.  Marland Kitchen alfuzosin (UROXATRAL) 10 MG 24 hr tablet Take 10 mg by mouth every morning.  Marland Kitchen aspirin EC 81 MG tablet Take 81 mg by mouth daily.  Marland Kitchen atorvastatin (LIPITOR) 20 MG tablet TAKE 1 TABLET BY MOUTH EVERY DAY  . fluconazole (DIFLUCAN) 100 MG tablet Take 1 tablet (100 mg total) by mouth daily. Take 1 tablet by mouth now repeat in 5 days  . gabapentin (NEURONTIN) 100 MG capsule Take 100 mg by mouth at bedtime.  Marland Kitchen guaiFENesin (MUCINEX) 600 MG 12 hr tablet Take 1 tablet (600 mg total) by mouth 2 (two) times daily.  Marland Kitchen HYDROcodone-acetaminophen (NORCO) 5-325 MG tablet Take 1 tablet by mouth every 6 (six) hours as needed for moderate pain.  . metoprolol tartrate (LOPRESSOR) 50 MG tablet TAKE 1 TABLET BY MOUTH EVERY DAY  . Multiple Vitamin (MULTIVITAMIN) tablet Take 1 tablet by mouth daily.  Marland Kitchen MYRBETRIQ 25 MG TB24 tablet TAKE 1 TABLET BY MOUTH EVERY DAY  . nystatin (NYSTATIN) powder Apply 1 application topically 3 (three) times daily.  Marland Kitchen olmesartan-hydrochlorothiazide (BENICAR HCT) 40-25 MG tablet Take 1 tablet by mouth daily.  . RYBELSUS 14 MG TABS TAKE 1 TABLET BY MOUTH DAILY. 30 MINUTES  BEFORE BREAKFAST  . sildenafil (REVATIO) 20 MG tablet TAKE 1 TABLET BY MOUTH EVERY DAY (PRIOR AUTHORIZATION DENIED)  . TRADJENTA 5 MG TABS tablet TAKE 1 TABLET BY MOUTH EVERY DAY  . traMADol (ULTRAM) 50 MG tablet Take 1 tablet (50 mg total) by mouth every 6 (six) hours as needed for severe pain.  . traZODone (DESYREL) 50 MG tablet TAKE 1 TABLET BY MOUTH EVERYDAY AT BEDTIME   No facility-administered encounter medications on file as of 11/10/2019.     Goals Addressed              This Visit's Progress     Patient Stated   .  "I need help getting into an assisted living facility" (pt-stated)   On track     [name] (see longitudinal plan of care for additional care plan information)  Current Barriers:  Marland Kitchen Knowledge Deficits related to process for admission into an ALF . Chronic Disease Management support and education needs related to CKD III, DM II, HTN  Nurse Case Manager Clinical Goal(s):  Marland Kitchen Over the next 90 days, patient will work with embedded BSW and PCP to address needs related to assistance for admission into an ALF  CCM SW Interventions: Completed 11/10/19 . Successful outbound call placed to the patient to assess goal progression . Determined the patient has been in contact with [facility] and was informed he may enter the facility to complete a tour o The  patient reports his back is painful this week bu the hopes to visit the community over the next week to complete the tour . Scheduled follow up call over the next two weeks to assess goal progression  Completed 11/03/19 . Successful outbound call placed to the patient to advise no other bed offers have been made at this time . Discussed opportunity to stay in the home and await a bed a [facility], which the patient previously identified as his top choice o The patient reports he is not sure if he would like to wait on placement at [facility] patient indicates he is interested in  [place] . Successful outbound call placed to [facility] with [name] who confirms SA beds are still available o Unfortunately, tours are not permitted at this time in person but Mrs. [name] reports she is happy to participate in a zoom call o Provided [name] with the patients contact number to discuss a zoom tour . Scheduled follow up call over the next week to assess goal progression  Completed 10/29/19 . Collaboration with patient primary provider who reports one dose of [name] is approximately 80% effective and recommendation for patient to receive second [name] dose of COVID 19 vaccine to better protect the patient prior to placement . Successful outbound call placed to the patient to relay information including approximate efficacy and recommendation for the patient to receive the second dose prior to placement . Determined the patient is not considering a second dose at this time due to side effects from first vaccine and the concern that two doses are not 100% effective in preventing COVID 19 infection o SW discussed the patient's reaction may have been due to vaccine and/or the COVID 19 infection the patient contracted shortly after vaccination o Advised the patient that due to plans to move into an ALF, risk of infection would be higher in communal living due to inability to social distance . Advised the patient SW is unclear if an ALF will allow placement with only one dose of the vaccine o Patient reported understanding and still reports plan to decline second dose at this time . Discussed the patient still only has two current bed offers from [facility] and [facility] o The patient is choosing to decline both bed offers due to star rating and neighborhood facilities are located in o The patient reports he is on a waiting list for [facility] which is his first choice . Scheduled follow up call over  the next week to reach out to communities to confirm receipt of Fl[facility] and inquire bed availability . Collaboration with Randy Brine, [name] and [place] to inform on plan  Completed 10/27/19 . Successful outbound call placed to the patient to review bed offers . Discussed patient has received a bed offer at both [facility] and [facility] . Determined the patient is interested in contacting [facility] to request more information about their community o Provided the patient with contact information . Discussed the patient has yet to receive second dose of COVID vaccine and is not sure he will based on receiving one dose and recovering from [place] he feels a second dose is not needed o Advised the patient SW is unable to confirm placement will be available without him being fully vaccinated o Collaboration with Randy Brine, [name] for further guidance . Collaboration with RN  Care Manager [place] to discuss patient current bed offers o Discussed concern surrounding star ratings of both facilities . Successful outbound call placed to the patient to review star ratings and deficiencies on last county monitoring visit o Determined the patient prefers to move into an ALF that is 3 stars or greater o The patient reports he is currently on the wait list at [facility] patient states he is not interested in [facility] due to neighborhood the community is in . SW will follow up with the patient over the next week  CCM RN CM Interventions:  . Inter-disciplinary care team collaboration (see longitudinal plan of care) . Determined patient would like to move into an ALF and would like assistance from the embedded BSW and PCP . Collaborated with embedded BSW [name] regarding patient's request for assistance with admission into an ALF . Discussed plans with patient for ongoing care management follow up and provided patient with direct  contact information for care management team  Patient Self Care Activities:  . Self administers medications as prescribed . Attends all scheduled provider appointments . Calls pharmacy for medication refills . Calls provider office for new concerns or questions  Please see past updates related to this goal by clicking on the "Past Updates" button in the selected goal          Follow Up Plan: SW will follow up with patient by phone over the next two weeks.   [name], BSW, CDP Social Worker, Certified Dementia Practitioner [place] [facility] [place] Management [phone]  Total time spent performing care coordination and/or care management activities with the patient by phone or face to face = 10 minutes.

## 2019-11-11 ENCOUNTER — Ambulatory Visit: Payer: Medicare Other | Admitting: Orthopedic Surgery

## 2019-11-11 DIAGNOSIS — N1832 Chronic kidney disease, stage 3b: Secondary | ICD-10-CM | POA: Diagnosis not present

## 2019-11-11 DIAGNOSIS — M109 Gout, unspecified: Secondary | ICD-10-CM | POA: Diagnosis not present

## 2019-11-11 DIAGNOSIS — K5900 Constipation, unspecified: Secondary | ICD-10-CM | POA: Diagnosis not present

## 2019-11-11 DIAGNOSIS — Z79899 Other long term (current) drug therapy: Secondary | ICD-10-CM | POA: Diagnosis not present

## 2019-11-11 DIAGNOSIS — K219 Gastro-esophageal reflux disease without esophagitis: Secondary | ICD-10-CM | POA: Diagnosis not present

## 2019-11-11 DIAGNOSIS — E871 Hypo-osmolality and hyponatremia: Secondary | ICD-10-CM | POA: Diagnosis not present

## 2019-11-11 DIAGNOSIS — E1169 Type 2 diabetes mellitus with other specified complication: Secondary | ICD-10-CM | POA: Diagnosis not present

## 2019-11-11 DIAGNOSIS — U071 COVID-19: Secondary | ICD-10-CM | POA: Diagnosis not present

## 2019-11-11 DIAGNOSIS — E785 Hyperlipidemia, unspecified: Secondary | ICD-10-CM | POA: Diagnosis not present

## 2019-11-11 DIAGNOSIS — E1140 Type 2 diabetes mellitus with diabetic neuropathy, unspecified: Secondary | ICD-10-CM | POA: Diagnosis not present

## 2019-11-11 DIAGNOSIS — D509 Iron deficiency anemia, unspecified: Secondary | ICD-10-CM | POA: Diagnosis not present

## 2019-11-11 DIAGNOSIS — J1282 Pneumonia due to coronavirus disease 2019: Secondary | ICD-10-CM | POA: Diagnosis not present

## 2019-11-11 DIAGNOSIS — E1122 Type 2 diabetes mellitus with diabetic chronic kidney disease: Secondary | ICD-10-CM | POA: Diagnosis not present

## 2019-11-11 DIAGNOSIS — I129 Hypertensive chronic kidney disease with stage 1 through stage 4 chronic kidney disease, or unspecified chronic kidney disease: Secondary | ICD-10-CM | POA: Diagnosis not present

## 2019-11-12 NOTE — Chronic Care Management (AMB) (Signed)
Chronic Care Management Pharmacy  Name: [name]  MRN: [id number] DOB: 1955/10/02  Chief Complaint/ HPI  [name],  64 y.o. , male presents for their Follow-Up CCM visit with the clinical pharmacist via telephone due to COVID-19 Pandemic.   PCP : [name], [name]  Their chronic conditions include: Hypertension, Diabetes, CKD, Dyslipidemia, Depressive disorder, Insomnia   Office Visits: 10/07/19 TV: Pt recently discharged from hospital after COVID pneumonia. Also found to have moderate to severe hydronephrosis, but refused nephrostomy tube. Ordered PT for strength and endurance. Have remote health contact pt. Recheck chest Xray August 9th. Advised pt to contact Urologist. Provided limited amount of Norco for right flank pain. Encouraged to stay well hydrated.   08/26/19 AWV and OV: Presented for DM follow up. Pt reported rash to right groin area. Pt tolerating Rybelsus and Tradjenta well. Labs ordered (HgbA1c). Pt encouraged to stay well hydrated and avoid NSAIDs. Sildenafil refilled. Started nystatin powder and oral fluconazole 144m daily for 5 days for erythema present to groin area. [facility].   05/05/19 OV: Follow up diabetic visit. Labs ordered (HgbA1c, Lipid panel, CMP14+EGFR). Recommended to remain hydrated to help kidney function. Diet and exercise education provided. Started sildenafil 254mdaily for erectile dysfunction.  03/25/19 Telemed visit: Presented for wheezing/non-productive cough over the past week. Started Mucinex as needed for chest congestion  02/02/19 OV: Follow up diabetic visit. BG was 226 in office. Diabetes is poorly controlled, started on Rybelsus 52m39maily. Labs ordered (HgbA1c, CMP14+EGFR). Referred to CCM RN for diabetes education. Stage 3b CKD  Consult Visits: 09/28/19-10/05/19 ED admission: Presented with weakness, nausea and vomiting for about 1 week. Found to have mild/moderate  COVID/pneumonia. Completed steroids and Remdesivir course. Discharged symptom free. Pt refused nephrostomy tube placement in hospital. F/U with PCP and Urologist in 7 days.   09/07/19 Dermatology OV w/ Dr. StrShaaron Adlerresented as new pt for itching and burning on chest/back/groin. Pt has been going to tanning bed TIW which he thinks has helped. Used gold bond powder on groin area. Uses Ivory soap and Tide Free and Clear detergent. Decrease light therapy to 5 minutes since pt is very tan. Started gabapentin 100m69m bedtime for itching and burning.  05/12/19 Telephone call: Sildenafil PA was denied by the insurance  05/11/19 OV w/ Urology w/ Dr. 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 Restart/ Continue alfuzosin 10mg3mly. PSA (13.03 H)and urine culture ordered. Follow up in 4 months.Previously on Rapaflo for LUTS. Myrbetriq started for urgency and urge incontinence.   CCM Encounters: 11/03/19 SW: No bed offers have been made at this time. Advised pt he could stay in home and [facility], not currently doing tours in person, but willing to do a zoom call with patient.   10/29/19 SW: Per PCP, recommend pt get 2nd dose of Moderna COVID vaccine; pt states he is not considering at this time due to side effects from the first shot. ALF may not allow placement if not fully vaccinated. [facility]  10/27/19 SW: Reviewed [facility] SW: Contacted pt to notify of [facility]. Pt states he no longer  wants to move into [facility]. Pt would like to consider other ALFs.   09/22/19 RN and SW: Pt reported nausea, vomiting and fatigue for past 5 days  after receiving first COVID vaccine. Collaborate/notify PCP. Outreach to [facility] assisted [facility] facility to discuss steps for placement. Collaborate with PCP for completion of paperwork.   09/13/19 RN: Evaluated current treatment plans with patient. Provided pt education  05/10/19 PharmD: Encouraged pt to check BG at home. Metformin was d/c by nephrologist due to kidney function. Counseled pt on correct administration of Rybelsus.   04/22/19 RN: Care plan established for chronic disease states.   03/05/19 SW: CCM consent obtained  Medications: Outpatient Encounter Medications as of 11/19/2019  Medication Sig  . acetaminophen (TYLENOL) 500 MG tablet Take 500 mg by mouth every 6 (six) hours as needed for mild pain or moderate pain.  . alfuzosin (UROXATRAL) 10 MG 24 hr tablet Take 10 mg by mouth every morning.  . aspirin EC 81 MG tablet Take 81 mg by mouth daily.  . atorvastatin (LIPITOR) 20 MG tablet TAKE 1 TABLET BY MOUTH EVERY DAY  . fluconazole (DIFLUCAN) 100 MG tablet Take 1 tablet (100 mg total) by mouth daily. Take 1 tablet by mouth now repeat in 5 days  . gabapentin (NEURONTIN) 100 MG capsule Take 100 mg by mouth at bedtime.  . guaiFENesin (MUCINEX) 600 MG 12 hr tablet Take 1 tablet (600 mg total) by mouth 2 (two) times daily.  . HYDROcodone-acetaminophen (NORCO) 5-325 MG tablet Take 1 tablet by mouth every 6 (six) hours as needed for moderate pain.  . metoprolol tartrate (LOPRESSOR) 50 MG tablet TAKE 1 TABLET BY MOUTH EVERY DAY  . Multiple Vitamin (MULTIVITAMIN) tablet Take 1 tablet by mouth daily.  . MYRBETRIQ 25 MG TB24 tablet TAKE 1 TABLET BY MOUTH EVERY DAY  . nystatin (NYSTATIN) powder Apply 1 application topically 3 (three) times daily.  . olmesartan-hydrochlorothiazide (BENICAR HCT) 40-25 MG tablet Take 1 tablet by mouth daily.  . RYBELSUS 14 MG TABS TAKE 1 TABLET BY MOUTH DAILY. 30 MINUTES BEFORE BREAKFAST  . sildenafil (REVATIO) 20 MG tablet TAKE 1 TABLET BY MOUTH EVERY  DAY (PRIOR AUTHORIZATION DENIED)  . TRADJENTA 5 MG TABS tablet TAKE 1 TABLET BY MOUTH EVERY DAY  . traMADol (ULTRAM) 50 MG tablet Take 1 tablet (50 mg total) by mouth every 6 (six) hours as needed for severe pain.  . traZODone (DESYREL) 50 MG tablet TAKE 1 TABLET BY MOUTH EVERYDAY AT BEDTIME   No facility-administered encounter medications on file as of 11/19/2019.   Current Diagnosis/Assessment: SDOH Interventions     Most Recent Value  SDOH Interventions  Financial Strain Interventions Intervention Not Indicated      Goals Addressed            This Visit's Progress   . Pharmacy Care Plan       CARE PLAN ENTRY  Current Barriers:  . Chronic Disease Management support, education, and care coordination needs related to Hypertension, Hyperlipidemia, Diabetes, and Chronic Kidney Disease   Hypertension . Pharmacist Clinical Goal(s): o Over the next 90 days, patient will work with PharmD and providers to maintain BP goal <130/80 . Current regimen:  o Metoprolol tartrate 50mg daily o Olmesartan/ HCTZ 40/25mg daily . Interventions: o Provided dietary and exercise recommendations . Patient self care activities - Over the next 90 days, patient will: o Check BP weekly, document, and provide at future appointments o Ensure daily salt intake < 2300 mg/day o Choose   low-sodium/no-sodium canned goods o Exercise 30 minutes 5 times weekly  Diabetes . Pharmacist Clinical Goal(s): o Over the next 90 days, patient will work with PharmD and providers to achieve A1c goal <7% . Current regimen:  o Rybelsus 14mg daily 30 minutes before breakfast o Tradjenta 5mg daily . Interventions: o Provided dietary and exercise recommendations - Discussed diet and exercise in detail o Advised patient to check blood sugar first thing in the morning before breakfast o Discussed appropriate goal for Hemoglobin A1c less than 7% . Patient self care activities - Over the next 90 days, patient will: o Check  blood sugar once daily, document, and provide at future appointments o Contact provider with any episodes of hypoglycemia o Increase exercise with goal of 30 minutes 5 times weekly o Increase water intake to 64 ounces daily  Hyperlipidemia Lab Results  Component Value Date/Time   LDLCALC 106 (H) 08/26/2019 03:42 PM   . Pharmacist Clinical Goal(s): o Over the next 90 days, patient will work with PharmD and providers to achieve LDL goal < 70 . Current regimen:  o Atorvastatin 20mg daily . Interventions: o Provided dietary and exercise recommendations o Recommend to PCP increasing atorvastatin 20mg to atorvastatin 40mg daily due to LDL >100, ASCVD risk, diabetes, and CKD . Patient self care activities - Over the next 90 days, patient will: o Take cholesterol medication daily as directed o Increase exercise with goal of 30 minutes 5 times weekly  Medication management . Pharmacist Clinical Goal(s): o Over the next 90 days, patient will work with PharmD and providers to achieve optimal medication adherence . [facility] . Interventions o Comprehensive medication review performed. o Continue current medication management strategy o Discussed medication synchronization , adherence packaging and delivery available with [facility] pharmacy . Patient self care activities - Over the next 90 days, patient will: o Focus on medication adherence by using pill box o Consider medication synchronization and pill packaging program as outlined be PharmD and notify PharmD if interested o Take medications as prescribed o Report any questions or concerns to PharmD and/or provider(s)  Please see past updates related to this goal by clicking on the "Past Updates" button in the selected goal         Diabetes   Recent Relevant Labs: Lab Results  Component Value Date/Time   HGBA1C 7.8 (H) 08/26/2019 03:42 PM   HGBA1C 9.2 (H) 05/05/2019 10:28 AM   HGBA1C 7.6 06/26/2016 12:00 AM   HGBA1C  7.6 06/26/2016 12:00 AM   MICROALBUR 80 08/26/2019 03:15 PM   MICROALBUR 150 03/26/2018 11:57 AM   Kidney Function Lab Results  Component Value Date/Time   CREATININE 1.69 (H) 10/05/2019 02:09 AM   CREATININE 1.75 (H) 10/04/2019 04:26 AM   GFRNONAA 42 (L) 10/05/2019 02:09 AM   GFRAA 48 (L) 10/05/2019 02:09 AM  Stage 3b CKD   Checking BG: Daily  Recent Random BG Readings: 120-130 Patient has failed these meds in past: Metformin Patient is currently uncontrolled but improved on the following medications:   Rybelsus 14mg daily 30 minutes before breakfast  Tradjenta 5mg daily  Last diabetic Foot exam: Not on file Last diabetic Eye exam: 06/17/19, no retinopathy, macular atrophy  We discussed:  . Diet extensively o Scrambled eggs, whole wheat toast, and turkey bacon o Sandwiches, peanut butter and crackers o Sugar-free wafers and sherbet o Chicken,beef, vegetables, brown rice o Drinks 3-4 bottles of water daily (Recommend pt drink 64 ounces of water daily) . Exercise extensively o   Pt has been able to walk some (short walks) 20-30 minutes - Limited due to back pain . Goal HgbA1c less than 7%  Plan Continue current medications   Hypertension   Office blood pressures are  BP Readings from Last 3 Encounters:  10/05/19 120/71  08/26/19 132/90  08/26/19 132/90   Patient has failed these meds in the past: Amlodipine, diltiazem ER, losartan/HCTZ, Metoprolol succinate, olmesartan Patient is currently controlled on the following medications:   Metoprolol tartrate 20m daily  Olmesartan/ HCTZ 40/268mdaily  Patient checks BP at home infrequently  Patient home BP readings are ranging: 110/74 (last time it was taken by home health aide)  We discussed:  Pt said BP has been good  Plan Continue current medications   Hyperlipidemia   LDL goal <70  Lipid Panel     Component Value Date/Time   CHOL 187 08/26/2019 1542   TRIG 270 (H) 08/26/2019 1542   HDL 34 (L)  08/26/2019 1542   CHOLHDL 5.5 (H) 08/26/2019 1542   CHOLHDL 6.2 04/19/2008 0430   VLDL 26 04/19/2008 0430   LDLCALC 106 (H) 08/26/2019 1542   LABVLDL 47 (H) 08/26/2019 1542     The 10-year ASCVD risk score (GMikey BussingC Jr., et al., 2013) is: 33.5%   Values used to calculate the score:     Age: [age]     Sex: Male     Is Non-Hispanic African American: No     Diabetic: Yes     Tobacco smoker: No     Systolic Blood Pressure: 13939mHg     Is BP treated: Yes     HDL Cholesterol: 34 mg/dL     Total Cholesterol: 187 mg/dL   Patient has failed these meds in past: N/A Patient is currently uncontrolled on the following medications:   Atorvastatin 204maily  Aspirin 81 mg daily  We discussed: . Diet and exercise extensively . Goals for LDL (<70), HDL (>40) and triglycerides (<150) . Possibility of increasing to atorvastatin 88m36mily if PCP approves to reduce LDL  Plan Continue current medications  Recommend increasing to atorvastatin 88mg41mly to PCP  Insomnia   Patient has failed these meds in past: Zolpidem Patient is currently controlled on the following medications:   Trazodone 50mg 25my at bedtime  Plan Continue current medications   BPH   Patient has failed these meds in past: Flomax Patient is currently controlled on the following medications:   Alfuzosin 10mg e44m morning  Plan Continue current medications  Neuropathy   Patient has failed these meds in past: N/A Patient is currently controlled on the following medications:   Gabapentin 100mg at35mtime  Plan Continue current medications  Overactive Bladder    Patient has failed these meds in past: N/A Patient is currently controlled on the following medications:   Myrbetriq 25mg dai67mPlan Continue current medications   CKD   Kidney Function Lab Results  Component Value Date/Time   CREATININE 1.69 (H) 10/05/2019 02:09 AM   CREATININE 1.75 (H) 10/04/2019 04:26 AM   GFRNONAA 42 (L)  10/05/2019 02:09 AM   GFRAA 48 (L) 10/05/2019 02:09 AM   K 4.6 10/05/2019 02:09 AM   K 5.0 10/04/2019 04:26 AM   Plan Continue care with Nephrologist  Vaccines   Reviewed and discussed patient's vaccination history.  No [facility] records available aside from 2013 influenza vaccine.  Immunization History  Administered Date(s) Administered  . Influenza Split 12/04/2009  . PPD Test 05/29/2016  Plan Discuss at follow up visit  Medication Management   [facility] Uses pill box? No - Pt has them organized in cabinet and that works for him Pt endorses 100% compliance  We discussed:   Importance of medication adherence  Pt interested in [facility] pharmacy; states he will let us know after Labor Day about pharmacy  Plan Continue current medication management strategy  Discuss [facility] pharmacy services at follow up visit  Follow up: 8 week phone visit   , PharmD Clinical Pharmacist [facility] [phone]              

## 2019-11-15 DIAGNOSIS — M109 Gout, unspecified: Secondary | ICD-10-CM | POA: Diagnosis not present

## 2019-11-15 DIAGNOSIS — E1122 Type 2 diabetes mellitus with diabetic chronic kidney disease: Secondary | ICD-10-CM | POA: Diagnosis not present

## 2019-11-15 DIAGNOSIS — K219 Gastro-esophageal reflux disease without esophagitis: Secondary | ICD-10-CM | POA: Diagnosis not present

## 2019-11-15 DIAGNOSIS — K5900 Constipation, unspecified: Secondary | ICD-10-CM | POA: Diagnosis not present

## 2019-11-15 DIAGNOSIS — I129 Hypertensive chronic kidney disease with stage 1 through stage 4 chronic kidney disease, or unspecified chronic kidney disease: Secondary | ICD-10-CM | POA: Diagnosis not present

## 2019-11-15 DIAGNOSIS — E871 Hypo-osmolality and hyponatremia: Secondary | ICD-10-CM | POA: Diagnosis not present

## 2019-11-15 DIAGNOSIS — E785 Hyperlipidemia, unspecified: Secondary | ICD-10-CM | POA: Diagnosis not present

## 2019-11-15 DIAGNOSIS — U071 COVID-19: Secondary | ICD-10-CM | POA: Diagnosis not present

## 2019-11-15 DIAGNOSIS — E1169 Type 2 diabetes mellitus with other specified complication: Secondary | ICD-10-CM | POA: Diagnosis not present

## 2019-11-15 DIAGNOSIS — Z79899 Other long term (current) drug therapy: Secondary | ICD-10-CM | POA: Diagnosis not present

## 2019-11-15 DIAGNOSIS — E1140 Type 2 diabetes mellitus with diabetic neuropathy, unspecified: Secondary | ICD-10-CM | POA: Diagnosis not present

## 2019-11-15 DIAGNOSIS — J1282 Pneumonia due to coronavirus disease 2019: Secondary | ICD-10-CM | POA: Diagnosis not present

## 2019-11-15 DIAGNOSIS — N1832 Chronic kidney disease, stage 3b: Secondary | ICD-10-CM | POA: Diagnosis not present

## 2019-11-15 DIAGNOSIS — D509 Iron deficiency anemia, unspecified: Secondary | ICD-10-CM | POA: Diagnosis not present

## 2019-11-18 ENCOUNTER — Telehealth: Payer: Self-pay

## 2019-11-18 NOTE — Chronic Care Management (AMB) (Signed)
Chronic Care Management Pharmacy Assistant   Name: [name]  MRN: [id number] DOB: September 02, 1955  Reason for Encounter: Follow up  PCP : [name], [name]  Allergies:  No Known Allergies  Medications: Outpatient Encounter Medications as of 11/18/2019  Medication Sig  . acetaminophen (TYLENOL) 500 MG tablet Take 500 mg by mouth every 6 (six) hours as needed for mild pain or moderate pain.  Marland Kitchen alfuzosin (UROXATRAL) 10 MG 24 hr tablet Take 10 mg by mouth every morning.  Marland Kitchen aspirin EC 81 MG tablet Take 81 mg by mouth daily.  Marland Kitchen atorvastatin (LIPITOR) 20 MG tablet TAKE 1 TABLET BY MOUTH EVERY DAY  . fluconazole (DIFLUCAN) 100 MG tablet Take 1 tablet (100 mg total) by mouth daily. Take 1 tablet by mouth now repeat in 5 days  . gabapentin (NEURONTIN) 100 MG capsule Take 100 mg by mouth at bedtime.  Marland Kitchen guaiFENesin (MUCINEX) 600 MG 12 hr tablet Take 1 tablet (600 mg total) by mouth 2 (two) times daily.  Marland Kitchen HYDROcodone-acetaminophen (NORCO) 5-325 MG tablet Take 1 tablet by mouth every 6 (six) hours as needed for moderate pain.  . metoprolol tartrate (LOPRESSOR) 50 MG tablet TAKE 1 TABLET BY MOUTH EVERY DAY  . Multiple Vitamin (MULTIVITAMIN) tablet Take 1 tablet by mouth daily.  Marland Kitchen MYRBETRIQ 25 MG TB24 tablet TAKE 1 TABLET BY MOUTH EVERY DAY  . nystatin (NYSTATIN) powder Apply 1 application topically 3 (three) times daily.  Marland Kitchen olmesartan-hydrochlorothiazide (BENICAR HCT) 40-25 MG tablet Take 1 tablet by mouth daily.  . RYBELSUS 14 MG TABS TAKE 1 TABLET BY MOUTH DAILY. 30 MINUTES BEFORE BREAKFAST  . sildenafil (REVATIO) 20 MG tablet TAKE 1 TABLET BY MOUTH EVERY DAY (PRIOR AUTHORIZATION DENIED)  . TRADJENTA 5 MG TABS tablet TAKE 1 TABLET BY MOUTH EVERY DAY  . traMADol (ULTRAM) 50 MG tablet Take 1 tablet (50 mg total) by mouth every 6 (six) hours as needed for severe pain.  . traZODone (DESYREL) 50 MG tablet TAKE 1 TABLET BY MOUTH EVERYDAY AT BEDTIME   No facility-administered encounter medications on  file as of 11/18/2019.    Current Diagnosis: Patient Active Problem List   Diagnosis Date Noted  . Pneumonia due to COVID-19 virus 09/29/2019  . Acute on chronic kidney failure ([place]) 09/28/2019  . Hyponatremia 09/28/2019  . Sinus tachycardia 09/28/2019  . Insomnia 08/26/2018  . Nephropathy 03/26/2018  . Urinary urgency 03/26/2018  . Type 2 diabetes mellitus with diabetic nephropathy, without long-term current use of insulin ([place]) 03/26/2018  . Essential hypertension 03/26/2018  . Chronic kidney disease (CKD), stage III (moderate) 03/26/2018  . Conductive hearing loss, bilateral 11/07/2017  . Uncontrolled type 2 diabetes mellitus with hyperglycemia ([place]) 07/31/2016  . Dyslipidemia associated with type 2 diabetes mellitus ([place]) 05/29/2016  . Microcytic anemia 02/17/2012  . 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 Benign hypertension 02/17/2012  . Depressive disorder 02/17/2012    Follow-Up:  Coordination of Enhanced Pharmacy Services and Pharmacist Review- Spoke with patient, called to remind of follow up call with Pharmacist on 11/19/2019 at 1130 am. Asked patient to have all his medication near during visit and inquired if he was having any trouble getting his medications. Patient states he wasn't and has his friend pick his medications up at the pharmacy and his insurance [facility] pays for his medications. Inquired if he would like to have his medications brought out to his home [facility] and he would not have to worry about sending anyone out or  even worry about his refills or running out of medications. Patient states he is considering changing to this program, he will think about it further. Jannette Fogo, CPP advised. Onboard sheet will be prepped in case patient decides to commit.  [name], [place] Pharmacist Assistant [phone]

## 2019-11-19 ENCOUNTER — Ambulatory Visit: Payer: Self-pay

## 2019-11-19 ENCOUNTER — Other Ambulatory Visit: Payer: Self-pay

## 2019-11-19 DIAGNOSIS — E785 Hyperlipidemia, unspecified: Secondary | ICD-10-CM | POA: Diagnosis not present

## 2019-11-19 DIAGNOSIS — Z79899 Other long term (current) drug therapy: Secondary | ICD-10-CM | POA: Diagnosis not present

## 2019-11-19 DIAGNOSIS — E871 Hypo-osmolality and hyponatremia: Secondary | ICD-10-CM | POA: Diagnosis not present

## 2019-11-19 DIAGNOSIS — E1140 Type 2 diabetes mellitus with diabetic neuropathy, unspecified: Secondary | ICD-10-CM | POA: Diagnosis not present

## 2019-11-19 DIAGNOSIS — J1282 Pneumonia due to coronavirus disease 2019: Secondary | ICD-10-CM | POA: Diagnosis not present

## 2019-11-19 DIAGNOSIS — E1122 Type 2 diabetes mellitus with diabetic chronic kidney disease: Secondary | ICD-10-CM | POA: Diagnosis not present

## 2019-11-19 DIAGNOSIS — I10 Essential (primary) hypertension: Secondary | ICD-10-CM

## 2019-11-19 DIAGNOSIS — K219 Gastro-esophageal reflux disease without esophagitis: Secondary | ICD-10-CM | POA: Diagnosis not present

## 2019-11-19 DIAGNOSIS — E1169 Type 2 diabetes mellitus with other specified complication: Secondary | ICD-10-CM | POA: Diagnosis not present

## 2019-11-19 DIAGNOSIS — K5900 Constipation, unspecified: Secondary | ICD-10-CM | POA: Diagnosis not present

## 2019-11-19 DIAGNOSIS — U071 COVID-19: Secondary | ICD-10-CM | POA: Diagnosis not present

## 2019-11-19 DIAGNOSIS — M109 Gout, unspecified: Secondary | ICD-10-CM | POA: Diagnosis not present

## 2019-11-19 DIAGNOSIS — I129 Hypertensive chronic kidney disease with stage 1 through stage 4 chronic kidney disease, or unspecified chronic kidney disease: Secondary | ICD-10-CM | POA: Diagnosis not present

## 2019-11-19 DIAGNOSIS — N1832 Chronic kidney disease, stage 3b: Secondary | ICD-10-CM | POA: Diagnosis not present

## 2019-11-19 DIAGNOSIS — E1121 Type 2 diabetes mellitus with diabetic nephropathy: Secondary | ICD-10-CM

## 2019-11-19 DIAGNOSIS — D509 Iron deficiency anemia, unspecified: Secondary | ICD-10-CM | POA: Diagnosis not present

## 2019-11-23 ENCOUNTER — Ambulatory Visit: Payer: Medicare Other

## 2019-11-23 DIAGNOSIS — I10 Essential (primary) hypertension: Secondary | ICD-10-CM

## 2019-11-23 DIAGNOSIS — E1121 Type 2 diabetes mellitus with diabetic nephropathy: Secondary | ICD-10-CM

## 2019-11-23 DIAGNOSIS — N1832 Chronic kidney disease, stage 3b: Secondary | ICD-10-CM

## 2019-11-23 NOTE — Patient Instructions (Signed)
Social Worker Visit Information  Goals we discussed today:  Goals Addressed              This Visit's Progress     Patient Stated   .  "I need help getting into an assisted living facility" (pt-stated)   Not on track     [place] [place] (see longitudinal plan of care for additional care plan information)  Current Barriers:  Marland Kitchen Knowledge Deficits related to process for admission into an ALF . Chronic Disease Management support and education needs related to CKD III, DM II, HTN  Nurse Case Manager Clinical Goal(s):  Marland Kitchen Over the next 90 days, patient will work with embedded BSW and PCP to address needs related to assistance for admission into an ALF  CCM SW Interventions: Completed 11/23/19 . Successful outbound call placed to the patient to assess goal progression . Determined the patient has yet to [place] but states he "hopes to go Thursday" . Scheduled follow up call over the next two weeks . Advised patient to contact SW directly as needed prior to next scheduled call  Completed 11/10/19 . Successful outbound call placed to the patient to assess goal progression . Determined the patient has been in contact with [facility] and was informed he may enter the facility to complete a tour o The patient reports his back is painful this week bu the hopes to visit the community over the next week to complete the tour . Scheduled follow up call over the next two weeks to assess goal progression  Completed 11/03/19 . Successful outbound call placed to the patient to advise no other bed offers have been made at this time . Discussed opportunity to stay in the home and await a bed a [facility], which the patient previously identified as his top choice o The patient reports he is not sure if he would like to wait on placement at [facility] patient indicates he is interested in [facility] . Successful outbound call  placed to [place] with Joycelyn Schmid who confirms SA beds are still available o Unfortunately, tours are not permitted at this time in person but [name] reports she is happy to participate in a zoom call o Provided [name] with the patients contact number to discuss a zoom tour . Scheduled follow up call over the next week to assess goal progression  Completed 10/29/19 . Collaboration with patient primary provider who reports one dose of Moderna is approximately 80% effective and recommendation for patient to receive second Moderna dose of COVID 19 vaccine to better protect the patient prior to placement . Successful outbound call placed to the patient to relay information including approximate efficacy and recommendation for the patient to receive the second dose prior to placement . Determined the patient is not considering a second dose at this time due to side effects from first vaccine and the concern that two doses are not 100% effective in preventing COVID 19 infection o SW discussed the patient's reaction may have been due to vaccine and/or the COVID 19 infection the patient contracted shortly after vaccination o Advised the patient that due to plans to move into an ALF, risk of infection would be higher in communal living due to inability to social distance . Advised the patient SW is unclear if an ALF will allow placement with only one dose of the vaccine o Patient  reported understanding and still reports plan to decline second dose at this time . Discussed the patient still only has two current bed offers from [facility] and [facility] o The patient is choosing to decline both bed offers due to star rating and neighborhood facilities are located in o The patient reports he is on a waiting list for [facility] which is his first choice . Scheduled follow up call over the next week to reach out to communities to confirm receipt of  Fl2 and inquire bed availability . Collaboration with [name], [name] and [place] to inform on plan  Completed 10/27/19 . Successful outbound call placed to the patient to review bed offers . Discussed patient has received a bed offer at both [facility] and [facility] . Determined the patient is interested in contacting [place] to request more information about their community o Provided the patient with contact information . Discussed the patient has yet to receive second dose of COVID vaccine and is not sure he will based on receiving one dose and recovering from [place] he feels a second dose is not needed o Advised the patient SW is unable to confirm placement will be available without him being fully vaccinated o Collaboration with [name], [name] for further guidance . Collaboration with RN Care Manager [place] to discuss patient current bed offers o Discussed concern surrounding star ratings of both facilities . Successful outbound call placed to the patient to review star ratings and deficiencies on last county monitoring visit o Determined the patient prefers to move into an ALF that is 3 stars or greater o The patient reports he is currently on the wait list at [facility] patient states he is not interested in [facility] due to neighborhood the community is in . SW will follow up with the patient over the next week  CCM RN CM Interventions:  . Inter-disciplinary care team collaboration (see longitudinal plan of care) . Determined patient would like to move into an ALF and would like assistance from the embedded BSW and PCP . Collaborated with embedded BSW [name] regarding patient's request for assistance with admission into an ALF . Discussed plans with patient for ongoing care management follow up and provided patient with direct contact information for care management team  Patient Self Care  Activities:  . Self administers medications as prescribed . Attends all scheduled provider appointments . Calls pharmacy for medication refills . Calls provider office for new concerns or questions  Please see past updates related to this goal by clicking on the "Past Updates" button in the selected goal          Follow Up Plan: SW will follow up with patient by phone over the next two weeks.   [name], BSW, CDP Social Worker, Certified Dementia Practitioner [place] / [place] Management [phone]

## 2019-11-23 NOTE — Chronic Care Management (AMB) (Signed)
Chronic Care Management    Social Work Follow Up Note  11/23/2019 Name: [name] MRN: [id number] DOB: [birth year]  [name] is a 64 y.o. year old male who is a primary care patient of [name], [place]. The CCM team was consulted for assistance with care coordination.   Review of patient status, including review of consultants reports, other relevant assessments, and collaboration with appropriate care team members and the patient's provider was performed as part of comprehensive patient evaluation and provision of chronic care management services.    SDOH (Social Determinants of Health) assessments performed: No    Outpatient Encounter Medications as of 11/23/2019  Medication Sig  . acetaminophen (TYLENOL) 500 MG tablet Take 500 mg by mouth every 6 (six) hours as needed for mild pain or moderate pain.  [name] alfuzosin (UROXATRAL) 10 MG 24 hr tablet Take 10 mg by mouth every morning.  [name] aspirin EC 81 MG tablet Take 81 mg by mouth daily.  [name] atorvastatin (LIPITOR) 20 MG tablet TAKE 1 TABLET BY MOUTH EVERY DAY  . fluconazole (DIFLUCAN) 100 MG tablet Take 1 tablet (100 mg total) by mouth daily. Take 1 tablet by mouth now repeat in 5 days  . gabapentin (NEURONTIN) 100 MG capsule Take 100 mg by mouth at bedtime.  [name] guaiFENesin (MUCINEX) 600 MG 12 hr tablet Take 1 tablet (600 mg total) by mouth 2 (two) times daily.  [name] HYDROcodone-acetaminophen (NORCO) 5-325 MG tablet Take 1 tablet by mouth every 6 (six) hours as needed for moderate pain.  . metoprolol tartrate (LOPRESSOR) 50 MG tablet TAKE 1 TABLET BY MOUTH EVERY DAY  . Multiple Vitamin (MULTIVITAMIN) tablet Take 1 tablet by mouth daily.  [name] MYRBETRIQ 25 MG TB24 tablet TAKE 1 TABLET BY MOUTH EVERY DAY  . nystatin (NYSTATIN) powder Apply 1 application topically 3 (three) times daily.  [name] olmesartan-hydrochlorothiazide (BENICAR HCT) 40-25 MG tablet Take 1 tablet by mouth daily.  . RYBELSUS 14 MG TABS TAKE 1 TABLET BY MOUTH DAILY. 30 MINUTES BEFORE  BREAKFAST  . sildenafil (REVATIO) 20 MG tablet TAKE 1 TABLET BY MOUTH EVERY DAY (PRIOR AUTHORIZATION DENIED)  . TRADJENTA 5 MG TABS tablet TAKE 1 TABLET BY MOUTH EVERY DAY  . traMADol (ULTRAM) 50 MG tablet Take 1 tablet (50 mg total) by mouth every 6 (six) hours as needed for severe pain.  . traZODone (DESYREL) 50 MG tablet TAKE 1 TABLET BY MOUTH EVERYDAY AT BEDTIME   No facility-administered encounter medications on file as of 11/23/2019.     Goals Addressed              This Visit's Progress     Patient Stated   .  "I need help getting into an assisted living facility" (pt-stated)   Not on track     [place] (see longitudinal plan of care for additional care plan information)  Current Barriers:  [name] Knowledge Deficits related to process for admission into an ALF . Chronic Disease Management support and education needs related to CKD III, DM II, HTN  Nurse Case Manager Clinical Goal(s):  [name] Over the next 90 days, patient will work with embedded BSW and PCP to address needs related to assistance for admission into an ALF  CCM SW Interventions: Completed 11/23/19 . Successful outbound call placed to the patient to assess goal progression . Determined the patient has yet to [place] but states he "hopes to go Thursday" . Scheduled follow up call over the  next two weeks . Advised patient to contact SW directly as needed prior to next scheduled call  Completed 11/10/19 . Successful outbound call placed to the patient to assess goal progression . Determined the patient has been in contact with [facility] and was informed he may enter the facility to complete a tour o The patient reports his back is painful this week bu the hopes to visit the community over the next week to complete the tour . Scheduled follow up call over the next two weeks to assess goal progression  Completed 11/03/19 . Successful outbound call placed to the patient to  advise no other bed offers have been made at this time . Discussed opportunity to stay in the home and await a bed a [facility], which the patient previously identified as his top choice o The patient reports he is not sure if he would like to wait on placement at [facility] patient indicates he is interested in [place] . Successful outbound call placed to [place] with Joycelyn Schmid who confirms SA beds are still available o Unfortunately, tours are not permitted at this time in person but [name] reports she is happy to participate in a zoom call o Provided [name] with the patients contact number to discuss a zoom tour . Scheduled follow up call over the next week to assess goal progression  Completed 10/29/19 . Collaboration with patient primary provider who reports one dose of Moderna is approximately 80% effective and recommendation for patient to receive second Moderna dose of COVID 19 vaccine to better protect the patient prior to placement . Successful outbound call placed to the patient to relay information including approximate efficacy and recommendation for the patient to receive the second dose prior to placement . Determined the patient is not considering a second dose at this time due to side effects from first vaccine and the concern that two doses are not 100% effective in preventing COVID 19 infection o SW discussed the patient's reaction may have been due to vaccine and/or the COVID 19 infection the patient contracted shortly after vaccination o Advised the patient that due to plans to move into an ALF, risk of infection would be higher in communal living due to inability to social distance . Advised the patient SW is unclear if an ALF will allow placement with only one dose of the vaccine o Patient reported understanding and still reports plan to decline second dose at this time . Discussed the patient  still only has two current bed offers from [facility] and [facility] o The patient is choosing to decline both bed offers due to star rating and neighborhood facilities are located in o The patient reports he is on a waiting list for [facility] which is his first choice . Scheduled follow up call over the next week to reach out to communities to confirm receipt of Fl2 and inquire bed availability . Collaboration with [name], [name] and [place] to inform on plan  Completed 10/27/19 . Successful outbound call placed to the patient to review bed offers . Discussed patient has received a bed offer at both [facility] and [facility] . Determined the patient is interested in contacting [facility] to request more information about their community o Provided the patient with contact information . Discussed the patient has yet to receive  second dose of COVID vaccine and is not sure he will based on receiving one dose and recovering from [place] he feels a second dose is not needed o Advised the patient SW is unable to confirm placement will be available without him being fully vaccinated o Collaboration with [name], [name] for further guidance . Collaboration with RN Care Manager [place] to discuss patient current bed offers o Discussed concern surrounding star ratings of both facilities . Successful outbound call placed to the patient to review star ratings and deficiencies on last county monitoring visit o Determined the patient prefers to move into an ALF that is 3 stars or greater o The patient reports he is currently on the wait list at [facility] patient states he is not interested in [facility] due to neighborhood the community is in . SW will follow up with the patient over the next week  CCM RN CM Interventions:  . Inter-disciplinary care team collaboration (see longitudinal plan of  care) . Determined patient would like to move into an ALF and would like assistance from the embedded BSW and PCP . Collaborated with embedded BSW [name] regarding patient's request for assistance with admission into an ALF . Discussed plans with patient for ongoing care management follow up and provided patient with direct contact information for care management team  Patient Self Care Activities:  . Self administers medications as prescribed . Attends all scheduled provider appointments . Calls pharmacy for medication refills . Calls provider office for new concerns or questions  Please see past updates related to this goal by clicking on the "Past Updates" button in the selected goal          Follow Up Plan: SW will follow up with patient by phone over the next two weeks.   [name], BSW, CDP Social Worker, Certified Dementia Practitioner [place] / [place] Management [phone]  Total time spent performing care coordination and/or care management activities with the patient by phone or face to face = 6 minutes.

## 2019-11-24 DIAGNOSIS — U071 COVID-19: Secondary | ICD-10-CM | POA: Diagnosis not present

## 2019-11-24 DIAGNOSIS — H3553 Other dystrophies primarily involving the sensory retina: Secondary | ICD-10-CM | POA: Diagnosis not present

## 2019-11-24 DIAGNOSIS — E1122 Type 2 diabetes mellitus with diabetic chronic kidney disease: Secondary | ICD-10-CM | POA: Diagnosis not present

## 2019-11-24 DIAGNOSIS — Z79899 Other long term (current) drug therapy: Secondary | ICD-10-CM | POA: Diagnosis not present

## 2019-11-24 DIAGNOSIS — D509 Iron deficiency anemia, unspecified: Secondary | ICD-10-CM | POA: Diagnosis not present

## 2019-11-24 DIAGNOSIS — K219 Gastro-esophageal reflux disease without esophagitis: Secondary | ICD-10-CM | POA: Diagnosis not present

## 2019-11-24 DIAGNOSIS — E871 Hypo-osmolality and hyponatremia: Secondary | ICD-10-CM | POA: Diagnosis not present

## 2019-11-24 DIAGNOSIS — M109 Gout, unspecified: Secondary | ICD-10-CM | POA: Diagnosis not present

## 2019-11-24 DIAGNOSIS — E1140 Type 2 diabetes mellitus with diabetic neuropathy, unspecified: Secondary | ICD-10-CM | POA: Diagnosis not present

## 2019-11-24 DIAGNOSIS — H541132 Blindness right eye category 3, low vision left eye category 2: Secondary | ICD-10-CM | POA: Diagnosis not present

## 2019-11-24 DIAGNOSIS — N1832 Chronic kidney disease, stage 3b: Secondary | ICD-10-CM | POA: Diagnosis not present

## 2019-11-24 DIAGNOSIS — K5900 Constipation, unspecified: Secondary | ICD-10-CM | POA: Diagnosis not present

## 2019-11-24 DIAGNOSIS — E785 Hyperlipidemia, unspecified: Secondary | ICD-10-CM | POA: Diagnosis not present

## 2019-11-24 DIAGNOSIS — I129 Hypertensive chronic kidney disease with stage 1 through stage 4 chronic kidney disease, or unspecified chronic kidney disease: Secondary | ICD-10-CM | POA: Diagnosis not present

## 2019-11-24 DIAGNOSIS — E1169 Type 2 diabetes mellitus with other specified complication: Secondary | ICD-10-CM | POA: Diagnosis not present

## 2019-11-24 DIAGNOSIS — J1282 Pneumonia due to coronavirus disease 2019: Secondary | ICD-10-CM | POA: Diagnosis not present

## 2019-11-30 DIAGNOSIS — E1140 Type 2 diabetes mellitus with diabetic neuropathy, unspecified: Secondary | ICD-10-CM | POA: Diagnosis not present

## 2019-11-30 DIAGNOSIS — U071 COVID-19: Secondary | ICD-10-CM | POA: Diagnosis not present

## 2019-11-30 DIAGNOSIS — N1832 Chronic kidney disease, stage 3b: Secondary | ICD-10-CM | POA: Diagnosis not present

## 2019-11-30 DIAGNOSIS — I129 Hypertensive chronic kidney disease with stage 1 through stage 4 chronic kidney disease, or unspecified chronic kidney disease: Secondary | ICD-10-CM | POA: Diagnosis not present

## 2019-11-30 DIAGNOSIS — E1122 Type 2 diabetes mellitus with diabetic chronic kidney disease: Secondary | ICD-10-CM | POA: Diagnosis not present

## 2019-11-30 DIAGNOSIS — E871 Hypo-osmolality and hyponatremia: Secondary | ICD-10-CM | POA: Diagnosis not present

## 2019-11-30 DIAGNOSIS — K219 Gastro-esophageal reflux disease without esophagitis: Secondary | ICD-10-CM | POA: Diagnosis not present

## 2019-11-30 DIAGNOSIS — E785 Hyperlipidemia, unspecified: Secondary | ICD-10-CM | POA: Diagnosis not present

## 2019-11-30 DIAGNOSIS — E1169 Type 2 diabetes mellitus with other specified complication: Secondary | ICD-10-CM | POA: Diagnosis not present

## 2019-11-30 DIAGNOSIS — M109 Gout, unspecified: Secondary | ICD-10-CM | POA: Diagnosis not present

## 2019-11-30 DIAGNOSIS — Z79899 Other long term (current) drug therapy: Secondary | ICD-10-CM | POA: Diagnosis not present

## 2019-11-30 DIAGNOSIS — D509 Iron deficiency anemia, unspecified: Secondary | ICD-10-CM | POA: Diagnosis not present

## 2019-11-30 DIAGNOSIS — J1282 Pneumonia due to coronavirus disease 2019: Secondary | ICD-10-CM | POA: Diagnosis not present

## 2019-11-30 DIAGNOSIS — K5900 Constipation, unspecified: Secondary | ICD-10-CM | POA: Diagnosis not present

## 2019-12-01 DIAGNOSIS — Z85828 Personal history of other malignant neoplasm of skin: Secondary | ICD-10-CM | POA: Diagnosis not present

## 2019-12-01 DIAGNOSIS — L280 Lichen simplex chronicus: Secondary | ICD-10-CM | POA: Diagnosis not present

## 2019-12-01 DIAGNOSIS — L219 Seborrheic dermatitis, unspecified: Secondary | ICD-10-CM | POA: Diagnosis not present

## 2019-12-02 ENCOUNTER — Telehealth: Payer: Self-pay

## 2019-12-02 ENCOUNTER — Ambulatory Visit: Payer: Medicare (Managed Care) | Admitting: Nurse Practitioner

## 2019-12-02 ENCOUNTER — Encounter: Payer: Self-pay | Admitting: Nurse Practitioner

## 2019-12-02 NOTE — Telephone Encounter (Signed)
Mel Almond with [facility] called requesting an order for pt to have OT. He is needing assistance with showering and getting in and out of the shower please fax order to [phone] if you have any questions you can call me at [phone]. YL,RMA

## 2019-12-03 DIAGNOSIS — N183 Chronic kidney disease, stage 3 unspecified: Secondary | ICD-10-CM | POA: Diagnosis not present

## 2019-12-03 DIAGNOSIS — E1121 Type 2 diabetes mellitus with diabetic nephropathy: Secondary | ICD-10-CM | POA: Diagnosis not present

## 2019-12-03 DIAGNOSIS — N179 Acute kidney failure, unspecified: Secondary | ICD-10-CM | POA: Diagnosis not present

## 2019-12-06 ENCOUNTER — Telehealth: Payer: Self-pay

## 2019-12-06 ENCOUNTER — Telehealth: Payer: Medicare Other

## 2019-12-06 DIAGNOSIS — K219 Gastro-esophageal reflux disease without esophagitis: Secondary | ICD-10-CM | POA: Diagnosis not present

## 2019-12-06 DIAGNOSIS — Z79899 Other long term (current) drug therapy: Secondary | ICD-10-CM | POA: Diagnosis not present

## 2019-12-06 DIAGNOSIS — E871 Hypo-osmolality and hyponatremia: Secondary | ICD-10-CM | POA: Diagnosis not present

## 2019-12-06 DIAGNOSIS — N1832 Chronic kidney disease, stage 3b: Secondary | ICD-10-CM | POA: Diagnosis not present

## 2019-12-06 DIAGNOSIS — M109 Gout, unspecified: Secondary | ICD-10-CM | POA: Diagnosis not present

## 2019-12-06 DIAGNOSIS — I129 Hypertensive chronic kidney disease with stage 1 through stage 4 chronic kidney disease, or unspecified chronic kidney disease: Secondary | ICD-10-CM | POA: Diagnosis not present

## 2019-12-06 DIAGNOSIS — E785 Hyperlipidemia, unspecified: Secondary | ICD-10-CM | POA: Diagnosis not present

## 2019-12-06 DIAGNOSIS — E1122 Type 2 diabetes mellitus with diabetic chronic kidney disease: Secondary | ICD-10-CM | POA: Diagnosis not present

## 2019-12-06 DIAGNOSIS — E1140 Type 2 diabetes mellitus with diabetic neuropathy, unspecified: Secondary | ICD-10-CM | POA: Diagnosis not present

## 2019-12-06 DIAGNOSIS — K5900 Constipation, unspecified: Secondary | ICD-10-CM | POA: Diagnosis not present

## 2019-12-06 DIAGNOSIS — D509 Iron deficiency anemia, unspecified: Secondary | ICD-10-CM | POA: Diagnosis not present

## 2019-12-06 DIAGNOSIS — J1282 Pneumonia due to coronavirus disease 2019: Secondary | ICD-10-CM | POA: Diagnosis not present

## 2019-12-06 DIAGNOSIS — U071 COVID-19: Secondary | ICD-10-CM | POA: Diagnosis not present

## 2019-12-06 DIAGNOSIS — E1169 Type 2 diabetes mellitus with other specified complication: Secondary | ICD-10-CM | POA: Diagnosis not present

## 2019-12-06 NOTE — Telephone Encounter (Signed)
Patient stated he is doing PT once a week but he doesn't feel it is really working he is still having pain. He wants to know if you think he needs to go to an inpatient therapy or something a little more aggressive for a couple of weeks. YL,RMA

## 2019-12-06 NOTE — Telephone Encounter (Cosign Needed)
  Chronic Care Management   Outreach Note  12/06/2019 Name: [name] MRN: [id number] DOB: Dec 14, 1955  Referred by: [name], [name] Reason for referral : Chronic Care Management (FU RN CM Call )   An unsuccessful telephone outreach was attempted today. The patient was referred to the case management team for assistance with care management and care coordination.   Follow Up Plan: A HIPAA compliant phone message was left for the patient providing contact information and requesting a return call.  Telephone follow up appointment with care management team member scheduled for: 01/11/20  Barb Merino, RN, BSN, CCM Care Management Coordinator [place] [facility]  Direct Phone: [phone]

## 2019-12-07 ENCOUNTER — Ambulatory Visit: Payer: Medicare Other

## 2019-12-07 DIAGNOSIS — N261 Atrophy of kidney (terminal): Secondary | ICD-10-CM | POA: Diagnosis not present

## 2019-12-07 DIAGNOSIS — N138 Other obstructive and reflux uropathy: Secondary | ICD-10-CM | POA: Diagnosis not present

## 2019-12-07 DIAGNOSIS — E1121 Type 2 diabetes mellitus with diabetic nephropathy: Secondary | ICD-10-CM

## 2019-12-07 DIAGNOSIS — I10 Essential (primary) hypertension: Secondary | ICD-10-CM

## 2019-12-07 DIAGNOSIS — N1832 Chronic kidney disease, stage 3b: Secondary | ICD-10-CM

## 2019-12-07 NOTE — Telephone Encounter (Signed)
I feel like he needs to see a specialist such as an orthopedic if he is still having pain and they can make a decision about therapy

## 2019-12-08 NOTE — Chronic Care Management (AMB) (Signed)
Chronic Care Management    Social Work Follow Up Note  12/07/2019 Name: [name] MRN: [id number] DOB: 10-Sep-1955  [name] is a 64 y.o. year old male who is a primary care patient of [name], [place]. The CCM team was consulted for assistance with care coordination.   Review of patient status, including review of consultants reports, other relevant assessments, and collaboration with appropriate care team members and the patient's provider was performed as part of comprehensive patient evaluation and provision of chronic care management services.    SDOH (Social Determinants of Health) assessments performed: No    Outpatient Encounter Medications as of 12/07/2019  Medication Sig   acetaminophen (TYLENOL) 500 MG tablet Take 500 mg by mouth every 6 (six) hours as needed for mild pain or moderate pain.   alfuzosin (UROXATRAL) 10 MG 24 hr tablet Take 10 mg by mouth every morning.   aspirin EC 81 MG tablet Take 81 mg by mouth daily.   atorvastatin (LIPITOR) 20 MG tablet TAKE 1 TABLET BY MOUTH EVERY DAY   fluconazole (DIFLUCAN) 100 MG tablet Take 1 tablet (100 mg total) by mouth daily. Take 1 tablet by mouth now repeat in 5 days   gabapentin (NEURONTIN) 100 MG capsule Take 100 mg by mouth at bedtime.   guaiFENesin (MUCINEX) 600 MG 12 hr tablet Take 1 tablet (600 mg total) by mouth 2 (two) times daily.   HYDROcodone-acetaminophen (NORCO) 5-325 MG tablet Take 1 tablet by mouth every 6 (six) hours as needed for moderate pain.   metoprolol tartrate (LOPRESSOR) 50 MG tablet TAKE 1 TABLET BY MOUTH EVERY DAY   Multiple Vitamin (MULTIVITAMIN) tablet Take 1 tablet by mouth daily.   MYRBETRIQ 25 MG TB24 tablet TAKE 1 TABLET BY MOUTH EVERY DAY   nystatin (NYSTATIN) powder Apply 1 application topically 3 (three) times daily.   olmesartan-hydrochlorothiazide (BENICAR HCT) 40-25 MG tablet Take 1 tablet by mouth daily.   RYBELSUS 14 MG TABS TAKE 1 TABLET BY MOUTH DAILY. 30 MINUTES  BEFORE BREAKFAST   sildenafil (REVATIO) 20 MG tablet TAKE 1 TABLET BY MOUTH EVERY DAY (PRIOR AUTHORIZATION DENIED)   TRADJENTA 5 MG TABS tablet TAKE 1 TABLET BY MOUTH EVERY DAY   traMADol (ULTRAM) 50 MG tablet Take 1 tablet (50 mg total) by mouth every 6 (six) hours as needed for severe pain.   traZODone (DESYREL) 50 MG tablet TAKE 1 TABLET BY MOUTH EVERYDAY AT BEDTIME   No facility-administered encounter medications on file as of 12/07/2019.     Goals Addressed              This Visit's Progress     Patient Stated     "I need help getting into an assisted living facility" (pt-stated)   Not on track     [place] (see longitudinal plan of care for additional care plan information)  Current Barriers:   Knowledge Deficits related to process for admission into an ALF  Chronic Disease Management support and education needs related to CKD III, DM II, HTN  Nurse Case Manager Clinical Goal(s):   Over the next 90 days, patient will work with embedded BSW and PCP to address needs related to assistance for admission into an ALF  CCM SW Interventions: Completed 12/07/19  Successful outbound call placed to the patient to assess goal progression  Determined the patient has yet to complete or scheduled a tour at [facility] o "I may go later next week"  Assessed  for barriers to tour completion; no barriers indicated at this time  Discussed the patient remains on the wait list for [facility] which is his top choice  Scheduled follow up call over the next month; SW will plan to close goal during next call if the patient has not yet completed the tour due to inability to progress   Completed 11/23/19  Successful outbound call placed to the patient to assess goal progression  Determined the patient has yet to [place] but states he "hopes to go Thursday"  Scheduled follow up call over the next two weeks  Advised patient to contact  SW directly as needed prior to next scheduled call  Completed 11/10/19  Successful outbound call placed to the patient to assess goal progression  Determined the patient has been in contact with [place] and was informed he may enter the facility to complete a tour o The patient reports his back is painful this week bu the hopes to visit the community over the next week to complete the tour  Scheduled follow up call over the next two weeks to assess goal progression  Completed 11/03/19  Successful outbound call placed to the patient to advise no other bed offers have been made at this time  Discussed opportunity to stay in the home and await a bed a [facility], which the patient previously identified as his top choice o The patient reports he is not sure if he would like to wait on placement at [place] patient indicates he is interested in [place]  Successful outbound call placed to [place] with Joycelyn Schmid who confirms SA beds are still available o Unfortunately, tours are not permitted at this time in person but [name] reports she is happy to participate in a zoom call o Provided [name] with the patients contact number to discuss a zoom tour  Scheduled follow up call over the next week to assess goal progression  Completed 10/29/19  Collaboration with patient primary provider who reports one dose of Moderna is approximately 80% effective and recommendation for patient to receive second Moderna dose of COVID 19 vaccine to better protect the patient prior to placement  Successful outbound call placed to the patient to relay information including approximate efficacy and recommendation for the patient to receive the second dose prior to placement  Determined the patient is not considering a second dose at this time due to side effects from first vaccine and the concern that two doses  are not 100% effective in preventing COVID 19 infection o SW discussed the patient's reaction may have been due to vaccine and/or the COVID 19 infection the patient contracted shortly after vaccination o Advised the patient that due to plans to move into an ALF, risk of infection would be higher in communal living due to inability to social distance  Advised the patient SW is unclear if an ALF will allow placement with only one dose of the vaccine o Patient reported understanding and still reports plan to decline second dose at this time  Discussed the patient still only has two current bed offers from [facility] and [place] o The patient is choosing to decline both bed offers due to star rating and neighborhood facilities are located in o The patient reports he is on a waiting list for [facility] which is his first choice  Scheduled follow  up call over the next week to reach out to communities to confirm receipt of Fl2 and inquire bed availability  Collaboration with [name], [name] and [place] to inform on plan  Completed 10/27/19  Successful outbound call placed to the patient to review bed offers  Discussed patient has received a bed offer at both [place] and [place] the patient is interested in contacting [place] to request more information about their community o Provided the patient with contact information  Discussed the patient has yet to receive second dose of COVID vaccine and is not sure he will based on receiving one dose and recovering from [place] he feels a second dose is not needed o Advised the patient SW is unable to confirm placement will be available without him being fully vaccinated o Collaboration with [name], [name] for further guidance  Collaboration with [place] to discuss patient current bed offers o Discussed concern surrounding  star ratings of both facilities  Successful outbound call placed to the patient to review star ratings and deficiencies on last county monitoring visit o Determined the patient prefers to move into an ALF that is 3 stars or greater o The patient reports he is currently on the wait list at [place] patient states he is not interested in [place] due to neighborhood the community is in  SW will follow up with the patient over the next week  CCM RN CM Interventions:   Inter-disciplinary care team collaboration (see longitudinal plan of care)  Determined patient would like to move into an ALF and would like assistance from the embedded BSW and PCP  Collaborated with embedded BSW [name] regarding patient's request for assistance with admission into an ALF  Discussed plans with patient for ongoing care management follow up and provided patient with direct contact information for care management team  Patient Self Care Activities:   Self administers medications as prescribed  Attends all scheduled provider appointments  Calls pharmacy for medication refills  Calls provider office for new concerns or questions  Please see past updates related to this goal by clicking on the "Past Updates" button in the selected goal        COMPLETED: "to feel better and stop vomiting" (pt-stated)        CARE PLAN ENTRY (see longitudinal plan of care for additional care plan information)  Current Barriers:   Knowledge Deficits related to diagnosis and treatment of acute symptoms of nausea, vomiting and fatigue  Chronic Disease Management support and education needs related to CKD III, DM II, HTN  Nurse Case Manager Clinical Goal(s):   Over the next 7 days, patient will work with the CCM team and PCP to address needs related to acute symptoms of nausea, vomiting and fatigue  12/07/19- Goal closed; patient no longer symptomatic  CCM RN CM Interventions:  09/22/19 call  completed with patient   Inter-disciplinary care team collaboration (see longitudinal plan of care)  Evaluation of current treatment plan related to nausea, vomiting and fatigue and patient's adherence to plan as established by provider  Determined patient received his 1st COVID 19 vaccine on 09/17/19 and by the following day he developed nausea, intermittent vomiting, decreased appetite and severe fatigue, these symptoms have been persistent over the past 5 days, patient states, "I just don't feel good at all"  Advised patient to go to the nearest ED for evaluation  and treatment of nausea, vomiting and fatigue  Provided education to patient re: potential complications secondary to uncontrolled and untreated persistent vomiting  Determined patient will ask his friend who is stopping by to visit to take him to the ED  Collaborated with PCP provider [name], [name] regarding patient's c/o persistent nausea, vomiting and fatigue x 5 days; Advised Mr. Mckinnon reports these symptoms started after receiving his 1st COVID 19 vaccine   Discussed plans with patient for ongoing care management follow up and provided patient with direct contact information for care management team   CCM SW Interventions: Completed 09/24/19  Successful outbound call placed to the patient to assess progression of patient goal  Determined the patient has yet to be seen in ED but "may go soon if congestion does not improve"  Patient reports his friend took him to [facility] today where he was able to speak with the pharmacist o The pharmacist informed the patient to check with his medical doctor to determine if the second COVID 19 vaccine would be appropriate to take  Encouraged the patient to seek medical care either by urgent care or ED over the weekend if cough/congestion worsen or do not improve  Patient Self Care Activities:   Self administers medications as prescribed  Attends all scheduled provider  appointments  Calls pharmacy for medication refills  Calls provider office for new concerns or questions  Please see past updates related to this goal by clicking on the "Past Updates" button in the selected goal          Follow Up Plan: SW will follow up with patient by phone over the next month.   [name], BSW, CDP Social Worker, Certified Dementia Practitioner [place] [facility] [place] [facility] [phone]  Total time spent performing care coordination and/or care management activities with the patient by phone or face to face = 10 minutes.

## 2019-12-08 NOTE — Telephone Encounter (Signed)
Patient is ok with you placing the referral he use to go see Dr.Duda [facility]

## 2019-12-08 NOTE — Patient Instructions (Signed)
Social Worker Visit Information  Goals we discussed today:  Goals Addressed              This Visit's Progress     Patient Stated     "I need help getting into an assisted living facility" (pt-stated)   Not on track     [place] (see longitudinal plan of care for additional care plan information)  Current Barriers:   Knowledge Deficits related to process for admission into an ALF  Chronic Disease Management support and education needs related to CKD III, DM II, HTN  Nurse Case Manager Clinical Goal(s):   Over the next 90 days, patient will work with embedded BSW and PCP to address needs related to assistance for admission into an ALF  CCM SW Interventions: Completed 12/07/19  Successful outbound call placed to the patient to assess goal progression  Determined the patient has yet to complete or scheduled a tour at [facility] o "I may go later next week"  Assessed for barriers to tour completion; no barriers indicated at this time  Discussed the patient remains on the wait list for [facility] which is his top choice  Scheduled follow up call over the next month; SW will plan to close goal during next call if the patient has not yet completed the tour due to inability to progress   Completed 11/23/19  Successful outbound call placed to the patient to assess goal progression  Determined the patient has yet to [place] but states he "hopes to go Thursday"  Scheduled follow up call over the next two weeks  Advised patient to contact SW directly as needed prior to next scheduled call  Completed 11/10/19  Successful outbound call placed to the patient to assess goal progression  Determined the patient has been in contact with [place] and was informed he may enter the facility to complete a tour o The patient reports his back is painful this week bu the hopes to visit the community over the next week  to complete the tour  Scheduled follow up call over the next two weeks to assess goal progression  Completed 11/03/19  Successful outbound call placed to the patient to advise no other bed offers have been made at this time  Discussed opportunity to stay in the home and await a bed a [facility], which the patient previously identified as his top choice o The patient reports he is not sure if he would like to wait on placement at [place] patient indicates he is interested in [place]  Successful outbound call placed to [place] with Joycelyn Schmid who confirms SA beds are still available o Unfortunately, tours are not permitted at this time in person but [name] reports she is happy to participate in a zoom call o Provided [name] with the patients contact number to discuss a zoom tour  Scheduled follow up call over the next week to assess goal progression  Completed 10/29/19  Collaboration with patient primary provider who reports one dose of Moderna is approximately 80% effective and recommendation for patient to receive second Moderna dose of COVID 19 vaccine to better protect the patient prior to placement  Successful outbound call placed to the patient to relay information including approximate efficacy and recommendation for the patient to receive the second dose prior to placement  Determined the patient is not  considering a second dose at this time due to side effects from first vaccine and the concern that two doses are not 100% effective in preventing COVID 19 infection o SW discussed the patient's reaction may have been due to vaccine and/or the COVID 19 infection the patient contracted shortly after vaccination o Advised the patient that due to plans to move into an ALF, risk of infection would be higher in communal living due to inability to social distance  Advised the patient SW is unclear if an  ALF will allow placement with only one dose of the vaccine o Patient reported understanding and still reports plan to decline second dose at this time  Discussed the patient still only has two current bed offers from [facility] and [place] o The patient is choosing to decline both bed offers due to star rating and neighborhood facilities are located in o The patient reports he is on a waiting list for [facility] which is his first choice  Scheduled follow up call over the next week to reach out to communities to confirm receipt of Fl2 and inquire bed availability  Collaboration with [name], [name] and [place] to inform on plan  Completed 10/27/19  Successful outbound call placed to the patient to review bed offers  Discussed patient has received a bed offer at both [place] and [place] the patient is interested in contacting [place] to request more information about their community o Provided the patient with contact information  Discussed the patient has yet to receive second dose of COVID vaccine and is not sure he will based on receiving one dose and recovering from [place] he feels a second dose is not needed o Advised the patient SW is unable to confirm placement will be available without him being fully vaccinated o Collaboration with [name], [name] for further guidance  Collaboration with [place] to discuss patient current bed offers o Discussed concern surrounding star ratings of both facilities  Successful outbound call placed to the patient to review star ratings and deficiencies on last county monitoring visit o Determined the patient prefers to move into an ALF that is 3 stars or greater o The patient reports he is currently on the wait list at [place] patient states he is not interested in [place] due to neighborhood the  community is in  SW will follow up with the patient over the next week  CCM RN CM Interventions:   Inter-disciplinary care team collaboration (see longitudinal plan of care)  Determined patient would like to move into an ALF and would like assistance from the embedded BSW and PCP  Collaborated with embedded BSW [name] regarding patient's request for assistance with admission into an ALF  Discussed plans with patient for ongoing care management follow up and provided patient with direct contact information for care management team  Patient Self Care Activities:   Self administers medications as prescribed  Attends all scheduled provider appointments  Calls pharmacy for medication refills  Calls provider office for new concerns or questions  Please see past updates related to this goal by clicking on the "Past Updates" button in the selected goal        COMPLETED: "to feel better and stop vomiting" (pt-stated)        CARE PLAN ENTRY (see longitudinal plan of care for additional care plan information)  Current Barriers:   Knowledge Deficits related to diagnosis and treatment of acute symptoms of nausea, vomiting and fatigue  Chronic Disease Management support and education needs related to CKD III, DM II, HTN  Nurse Case Manager Clinical Goal(s):   Over the next 7 days, patient will work with the CCM team and PCP to address needs related to acute symptoms of nausea, vomiting and fatigue  12/07/19- Goal closed; patient no longer symptomatic  CCM RN CM Interventions:  09/22/19 call completed with patient   Inter-disciplinary care team collaboration (see longitudinal plan of care)  Evaluation of current treatment plan related to nausea, vomiting and fatigue and patient's adherence to plan as established by provider  Determined patient received his 1st COVID 19 vaccine on 09/17/19 and by the following day he developed nausea, intermittent vomiting, decreased appetite  and severe fatigue, these symptoms have been persistent over the past 5 days, patient states, "I just don't feel good at all"  Advised patient to go to the nearest ED for evaluation and treatment of nausea, vomiting and fatigue  Provided education to patient re: potential complications secondary to uncontrolled and untreated persistent vomiting  Determined patient will ask his friend who is stopping by to visit to take him to the ED  Collaborated with PCP provider [name], [name] regarding patient's c/o persistent nausea, vomiting and fatigue x 5 days; Advised Mr. Bounds reports these symptoms started after receiving his 1st COVID 19 vaccine   Discussed plans with patient for ongoing care management follow up and provided patient with direct contact information for care management team   CCM SW Interventions: Completed 09/24/19  Successful outbound call placed to the patient to assess progression of patient goal  Determined the patient has yet to be seen in ED but "may go soon if congestion does not improve"  Patient reports his friend took him to [facility] today where he was able to speak with the pharmacist o The pharmacist informed the patient to check with his medical doctor to determine if the second COVID 19 vaccine would be appropriate to take  Encouraged the patient to seek medical care either by urgent care or ED over the weekend if cough/congestion worsen or do not improve  Patient Self Care Activities:   Self administers medications as prescribed  Attends all scheduled provider appointments  Calls pharmacy for medication refills  Calls provider office for new concerns or questions  Please see past updates related to this goal by clicking on the "Past Updates" button in the selected goal          Follow Up Plan: SW will follow up with patient by phone over the next month.   [name], BSW, CDP Social Worker, Certified Dementia Practitioner [place] [facility] [place]  [facility] [phone]

## 2019-12-10 NOTE — Patient Instructions (Addendum)
Visit Information  Goals Addressed            This Visit's Progress   . Pharmacy Care Plan       CARE PLAN ENTRY  Current Barriers:  . Chronic Disease Management support, education, and care coordination needs related to Hypertension, Hyperlipidemia, Diabetes, and Chronic Kidney Disease   Hypertension . Pharmacist Clinical Goal(s): o Over the next 90 days, patient will work with PharmD and providers to maintain BP goal <130/80 . Current regimen:  o Metoprolol tartrate 50mg  daily o Olmesartan/ HCTZ 40/25mg  daily . Interventions: o Provided dietary and exercise recommendations . Patient self care activities - Over the next 90 days, patient will: o Check BP weekly, document, and provide at future appointments o Ensure daily salt intake < 2300 mg/day o Choose low-sodium/no-sodium canned goods o Exercise 30 minutes 5 times weekly  Diabetes . Pharmacist Clinical Goal(s): o Over the next 90 days, patient will work with PharmD and providers to achieve A1c goal <7% . Current regimen:  o Rybelsus 14mg  daily 30 minutes before breakfast o Tradjenta 5mg  daily . Interventions: o Provided dietary and exercise recommendations - Discussed diet and exercise in detail o Advised patient to check blood sugar first thing in the morning before breakfast o Discussed appropriate goal for Hemoglobin A1c less than 7% . Patient self care activities - Over the next 90 days, patient will: o Check blood sugar once daily, document, and provide at future appointments o Contact provider with any episodes of hypoglycemia o Increase exercise with goal of 30 minutes 5 times weekly o Increase water intake to 64 ounces daily  Hyperlipidemia Lab Results  Component Value Date/Time   LDLCALC 106 (H) 08/26/2019 03:42 PM   . Pharmacist Clinical Goal(s): o Over the next 90 days, patient will work with PharmD and providers to achieve LDL goal < 70 . Current regimen:  o Atorvastatin 20mg   daily . Interventions: o Provided dietary and exercise recommendations o Recommend to PCP increasing atorvastatin 20mg  to atorvastatin 40mg  daily due to LDL >100, ASCVD risk, diabetes, and CKD . Patient self care activities - Over the next 90 days, patient will: o Take cholesterol medication daily as directed o Increase exercise with goal of 30 minutes 5 times weekly  Medication management . Pharmacist Clinical Goal(s): o Over the next 90 days, patient will work with PharmD and providers to achieve optimal medication adherence . Current pharmacy: [facility] . Interventions o Comprehensive medication review performed. o Continue current medication management strategy o Discussed medication synchronization , adherence packaging and delivery available with [facility] . Patient self care activities - Over the next 90 days, patient will: o Focus on medication adherence by using pill box o Consider medication synchronization and pill packaging program as outlined be PharmD and notify PharmD if interested o Take medications as prescribed o Report any questions or concerns to PharmD and/or provider(s)  Please see past updates related to this goal by clicking on the "Past Updates" button in the selected goal         The patient verbalized understanding of instructions provided today and agreed to receive a mailed copy of patient instruction and/or educational materials.  Telephone follow up appointment with pharmacy team member scheduled for: 01/17/20 @ 4:15 PM  Jannette Fogo, PharmD Clinical Pharmacist [facility] [phone]   Diabetes Mellitus and Nutrition, Adult When you have diabetes (diabetes mellitus), it is very important to have healthy eating habits because your blood sugar (glucose) levels are  greatly affected by what you eat and drink. Eating healthy foods in the appropriate amounts, at about the same times every day, can help you:  Control your  blood glucose.  Lower your risk of heart disease.  Improve your blood pressure.  Reach or maintain a healthy weight. Every person with diabetes is different, and each person has different needs for a meal plan. Your health care provider may recommend that you work with a diet and nutrition specialist (dietitian) to make a meal plan that is best for you. Your meal plan may vary depending on factors such as:  The calories you need.  The medicines you take.  Your weight.  Your blood glucose, blood pressure, and cholesterol levels.  Your activity level.  Other health conditions you have, such as heart or kidney disease. How do carbohydrates affect me? Carbohydrates, also called carbs, affect your blood glucose level more than any other type of food. Eating carbs naturally raises the amount of glucose in your blood. Carb counting is a method for keeping track of how many carbs you eat. Counting carbs is important to keep your blood glucose at a healthy level, especially if you use insulin or take certain oral diabetes medicines. It is important to know how many carbs you can safely have in each meal. This is different for every person. Your dietitian can help you calculate how many carbs you should have at each meal and for each snack. Foods that contain carbs include:  Bread, cereal, rice, pasta, and crackers.  Potatoes and corn.  Peas, beans, and lentils.  Milk and yogurt.  Fruit and juice.  Desserts, such as cakes, cookies, ice cream, and candy. How does alcohol affect me? Alcohol can cause a sudden decrease in blood glucose (hypoglycemia), especially if you use insulin or take certain oral diabetes medicines. Hypoglycemia can be a life-threatening condition. Symptoms of hypoglycemia (sleepiness, dizziness, and confusion) are similar to symptoms of having too much alcohol. If your health care provider says that alcohol is safe for you, follow these guidelines:  Limit alcohol  intake to no more than 1 drink per day for nonpregnant women and 2 drinks per day for men. One drink equals 12 oz of beer, 5 oz of wine, or 1 oz of hard liquor.  Do not drink on an empty stomach.  Keep yourself hydrated with water, diet soda, or unsweetened iced tea.  Keep in mind that regular soda, juice, and other mixers may contain a lot of sugar and must be counted as carbs. What are tips for following this plan?  Reading food labels  Start by checking the serving size on the "Nutrition Facts" label of packaged foods and drinks. The amount of calories, carbs, fats, and other nutrients listed on the label is based on one serving of the item. Many items contain more than one serving per package.  Check the total grams (g) of carbs in one serving. You can calculate the number of servings of carbs in one serving by dividing the total carbs by 15. For example, if a food has 30 g of total carbs, it would be equal to 2 servings of carbs.  Check the number of grams (g) of saturated and trans fats in one serving. Choose foods that have low or no amount of these fats.  Check the number of milligrams (mg) of salt (sodium) in one serving. Most people should limit total sodium intake to less than 2,300 mg per day.  Always check the  nutrition information of foods labeled as "low-fat" or "nonfat". These foods may be higher in added sugar or refined carbs and should be avoided.  Talk to your dietitian to identify your daily goals for nutrients listed on the label. Shopping  Avoid buying canned, premade, or processed foods. These foods tend to be high in fat, sodium, and added sugar.  Shop around the outside edge of the grocery store. This includes fresh fruits and vegetables, bulk grains, fresh meats, and fresh dairy. Cooking  Use low-heat cooking methods, such as baking, instead of high-heat cooking methods like deep frying.  Cook using healthy oils, such as olive, canola, or sunflower  oil.  Avoid cooking with butter, cream, or high-fat meats. Meal planning  Eat meals and snacks regularly, preferably at the same times every day. Avoid going long periods of time without eating.  Eat foods high in fiber, such as fresh fruits, vegetables, beans, and whole grains. Talk to your dietitian about how many servings of carbs you can eat at each meal.  Eat 4-6 ounces (oz) of lean protein each day, such as lean meat, chicken, fish, eggs, or tofu. One oz of lean protein is equal to: ? 1 oz of meat, chicken, or fish. ? 1 egg. ?  cup of tofu.  Eat some foods each day that contain healthy fats, such as avocado, nuts, seeds, and fish. Lifestyle  Check your blood glucose regularly.  Exercise regularly as told by your health care provider. This may include: ? 150 minutes of moderate-intensity or vigorous-intensity exercise each week. This could be brisk walking, biking, or water aerobics. ? Stretching and doing strength exercises, such as yoga or weightlifting, at least 2 times a week.  Take medicines as told by your health care provider.  Do not use any products that contain nicotine or tobacco, such as cigarettes and e-cigarettes. If you need help quitting, ask your health care provider.  Work with a Social worker or diabetes educator to identify strategies to manage stress and any emotional and social challenges. Questions to ask a health care provider  Do I need to meet with a diabetes educator?  Do I need to meet with a dietitian?  What number can I call if I have questions?  When are the best times to check my blood glucose? Where to find more information:  American Diabetes Association: diabetes.org  Academy of Nutrition and Dietetics: www.eatright.[employer] of Diabetes and Digestive and Kidney Diseases (NIH): [employer] Summary  A healthy meal plan will help you control your blood glucose and maintain a healthy lifestyle.  Working with a diet  and nutrition specialist (dietitian) can help you make a meal plan that is best for you.  Keep in mind that carbohydrates (carbs) and alcohol have immediate effects on your blood glucose levels. It is important to count carbs and to use alcohol carefully. This information is not intended to replace advice given to you by your health care provider. Make sure you discuss any questions you have with your health care provider. Document Revised: 02/14/2017 Document Reviewed: 04/08/2016 Elsevier Patient Education  2020 [employer].

## 2019-12-13 ENCOUNTER — Encounter: Payer: Self-pay | Admitting: Nurse Practitioner

## 2019-12-13 ENCOUNTER — Other Ambulatory Visit: Payer: Self-pay

## 2019-12-13 ENCOUNTER — Ambulatory Visit (INDEPENDENT_AMBULATORY_CARE_PROVIDER_SITE_OTHER): Payer: Medicare Other | Admitting: Nurse Practitioner

## 2019-12-13 VITALS — BP 126/84 | HR 98 | Temp 97.5°F | Ht 66.0 in | Wt 193.4 lb

## 2019-12-13 DIAGNOSIS — E1122 Type 2 diabetes mellitus with diabetic chronic kidney disease: Secondary | ICD-10-CM | POA: Diagnosis not present

## 2019-12-13 DIAGNOSIS — I700 Atherosclerosis of aorta: Secondary | ICD-10-CM | POA: Diagnosis not present

## 2019-12-13 DIAGNOSIS — N529 Male erectile dysfunction, unspecified: Secondary | ICD-10-CM

## 2019-12-13 DIAGNOSIS — I129 Hypertensive chronic kidney disease with stage 1 through stage 4 chronic kidney disease, or unspecified chronic kidney disease: Secondary | ICD-10-CM

## 2019-12-13 DIAGNOSIS — N183 Chronic kidney disease, stage 3 unspecified: Secondary | ICD-10-CM | POA: Diagnosis not present

## 2019-12-13 DIAGNOSIS — I10 Essential (primary) hypertension: Secondary | ICD-10-CM | POA: Diagnosis not present

## 2019-12-13 DIAGNOSIS — C61 Malignant neoplasm of prostate: Secondary | ICD-10-CM

## 2019-12-13 DIAGNOSIS — G4700 Insomnia, unspecified: Secondary | ICD-10-CM

## 2019-12-13 DIAGNOSIS — E1121 Type 2 diabetes mellitus with diabetic nephropathy: Secondary | ICD-10-CM | POA: Diagnosis not present

## 2019-12-13 MED ORDER — SILDENAFIL CITRATE 20 MG PO TABS: ORAL_TABLET | ORAL | 2 refills | Status: DC

## 2019-12-13 MED ORDER — TRAZODONE HCL 50 MG PO TABS: 50.0000 mg | ORAL_TABLET | Freq: Every evening | ORAL | 1 refills | Status: DC | PRN

## 2019-12-13 NOTE — Progress Notes (Signed)
I,Katawbba Wiggins,acting as a Neurosurgeon for [employer], [name].,have documented all relevant documentation on the behalf of [name], [name],as directed by  [name], [name] while in the presence of [name], [name].  This visit occurred during the SARS-CoV-2 public health emergency.  Safety protocols were in place, including screening questions prior to the visit, additional usage of staff PPE, and extensive cleaning of exam room while observing appropriate contact time as indicated for disinfecting solutions.  Subjective:     Patient ID: [name] , male    DOB: January 19, 1956 , 64 y.o.   MRN: [id number]   Chief Complaint  Patient presents with  . Diabetes    HPI  The patient is here today for a follow-up on his diabetes and blood pressure.  Wt Readings from Last 3 Encounters: 12/13/19 : 193 lb 6.4 oz (87.7 kg) 10/03/19 : 176 lb 5.9 oz (80 kg) 08/26/19 : 190 lb 11.2 oz (86.5 kg)  He is scheduled for an appt with Dr. Pete Glatter at [facility].  He is scheduled to have a biopsy for his prostate cancer and possible tube placement.    He has not had the second covid vaccine   Diabetes He presents for his follow-up diabetic visit. He has type 2 diabetes mellitus. His disease course has been stable. There are no hypoglycemic associated symptoms. Pertinent negatives for hypoglycemia include no dizziness, headaches or nervousness/anxiousness. Pertinent negatives for diabetes include no chest pain, no fatigue, no polydipsia, no polyphagia and no polyuria. There are no hypoglycemic complications. Symptoms are worsening. There are no diabetic complications. Risk factors for coronary artery disease include obesity, male sex, sedentary lifestyle and diabetes mellitus. Current diabetic treatment includes oral agent (dual therapy) (tolerating Rybelsus and Tradjenta well. ). He is compliant with treatment all of the time. He is following a generally unhealthy diet. When asked  about meal planning, he reported none. He has not had a previous visit with a dietitian. He rarely participates in exercise. His home blood glucose trend is decreasing steadily. (Last time he checked his blood sugar was 140 this was after eating a piece of cake.  He is trying to cut out on his sweets.) An ACE inhibitor/angiotensin II receptor blocker is being taken. He does not see a podiatrist.Eye exam is not current.     Past Medical History:  Diagnosis Date  . Closed nondisplaced spiral fracture of shaft of right tibia   . Complicated urinary tract infection 09/06/2016  . Depression   . Diabetes mellitus without complication (HCC)   . GERD (gastroesophageal reflux disease)   . Gout    right foot  . Hemorrhoids   . Hypertension   . Hypertension   . Macular degeneration   . Microcytic anemia   . Prostate cancer (HCC) 08/2009  . Rectal bleeding 07/31/2016  . Tubular adenoma      Family History  Problem Relation Age of Onset  . Lung cancer Mother        Deceased  . Throat cancer Brother   . Pancreatic cancer Father   . Heart disease Father        Deceased  . Lung cancer Maternal Uncle        nephew  . Diabetes Other      Current Outpatient Medications:  .  acetaminophen (TYLENOL) 500 MG tablet, Take 500 mg by mouth every 6 (six) hours as needed for mild pain or moderate pain., Disp: , Rfl:  .  alfuzosin (UROXATRAL)  10 MG 24 hr tablet, Take 10 mg by mouth every morning., Disp: , Rfl:  .  aspirin EC 81 MG tablet, Take 81 mg by mouth daily., Disp: , Rfl:  .  atorvastatin (LIPITOR) 20 MG tablet, TAKE 1 TABLET BY MOUTH EVERY DAY, Disp: 90 tablet, Rfl: 1 .  gabapentin (NEURONTIN) 100 MG capsule, Take 100 mg by mouth at bedtime., Disp: , Rfl:  .  guaiFENesin (MUCINEX) 600 MG 12 hr tablet, Take 1 tablet (600 mg total) by mouth 2 (two) times daily., Disp: 60 tablet, Rfl: 2 .  metoprolol tartrate (LOPRESSOR) 50 MG tablet, TAKE 1 TABLET BY MOUTH EVERY DAY, Disp: 90 tablet, Rfl: 1 .   Multiple Vitamin (MULTIVITAMIN) tablet, Take 1 tablet by mouth daily., Disp: , Rfl:  .  MYRBETRIQ 25 MG TB24 tablet, TAKE 1 TABLET BY MOUTH EVERY DAY, Disp: 90 tablet, Rfl: 1 .  nystatin (NYSTATIN) powder, Apply 1 application topically 3 (three) times daily., Disp: 15 g, Rfl: 0 .  olmesartan-hydrochlorothiazide (BENICAR HCT) 40-25 MG tablet, Take 1 tablet by mouth daily., Disp: , Rfl:  .  RYBELSUS 14 MG TABS, TAKE 1 TABLET BY MOUTH DAILY. [place], Disp: 30 tablet, Rfl: 3 .  sildenafil (REVATIO) 20 MG tablet, Take 1 tablet by mouth daily as needed, Disp: 10 tablet, Rfl: 2 .  TRADJENTA 5 MG TABS tablet, TAKE 1 TABLET BY MOUTH EVERY DAY, Disp: 90 tablet, Rfl: 0 .  traMADol (ULTRAM) 50 MG tablet, Take 1 tablet (50 mg total) by mouth every 6 (six) hours as needed for severe pain., Disp: 20 tablet, Rfl: 0 .  traZODone (DESYREL) 50 MG tablet, Take 1 tablet (50 mg total) by mouth at bedtime as needed for sleep., Disp: 90 tablet, Rfl: 1 .  HYDROcodone-acetaminophen (NORCO) 5-325 MG tablet, Take 1 tablet by mouth every 6 (six) hours as needed for moderate pain. (Patient not taking: Reported on 12/13/2019), Disp: 30 tablet, Rfl: 0   No Known Allergies   Review of Systems  Constitutional: Negative.  Negative for fatigue.  Respiratory: Negative.  Negative for shortness of breath.   Cardiovascular: Negative.  Negative for chest pain, palpitations and leg swelling.  Gastrointestinal: Negative.   Endocrine: Negative for polydipsia, polyphagia and polyuria.  Musculoskeletal: Positive for back pain (currently in physical therapy at home - 2 more sessions would like more intense physical therapy).  Neurological: Negative for dizziness and headaches.  Psychiatric/Behavioral: Negative.  The patient is not nervous/anxious.   All other systems reviewed and are negative.    Today's Vitals   12/13/19 1407  BP: 126/84  Pulse: 98  Temp: (!) 97.5 F (36.4 C)  TempSrc: Oral  Weight: 193 lb 6.4  oz (87.7 kg)  Height: $Remove'5\' 6"'[id number]$  (1.676 m)  PainSc: 6   PainLoc: Back   Body mass index is 31.22 kg/m.  Wt Readings from Last 3 Encounters:  12/13/19 193 lb 6.4 oz (87.7 kg)  10/03/19 176 lb 5.9 oz (80 kg)  08/26/19 190 lb 11.2 oz (86.5 kg)   Objective:  Physical Exam Vitals and nursing note reviewed.  Constitutional:      General: He is not in acute distress.    Appearance: Normal appearance. He is obese.  HENT:     Head: Normocephalic and atraumatic.  Cardiovascular:     Rate and Rhythm: Normal rate and regular rhythm.     Pulses: Normal pulses.     Heart sounds: Normal heart sounds.  Pulmonary:     Effort:  Pulmonary effort is normal. No respiratory distress.     Breath sounds: Normal breath sounds. No wheezing.  Skin:    General: Skin is warm and dry.  Neurological:     General: No focal deficit present.     Mental Status: He is alert and oriented to person, place, and time.  Psychiatric:        Mood and Affect: Mood normal.        Behavior: Behavior normal.        Thought Content: Thought content normal.        Judgment: Judgment normal.         Assessment And Plan:     1. Type 2 diabetes mellitus with diabetic nephropathy, without long-term current use of insulin (HCC)  Chronic,improving at last visit   Continue with current medications, tolerating well  Encouraged to limit intake of sugary foods and drinks - Hemoglobin A1c - Lipid panel  2. Insomnia, unspecified type  Chronic, controlled  Continue with trazodone as needed - traZODone (DESYREL) 50 MG tablet; Take 1 tablet (50 mg total) by mouth at bedtime as needed for sleep.  Dispense: 90 tablet; Refill: 1  3. Hypertension associated with stage 3 chronic kidney disease due to type 2 diabetes mellitus ([place]) . B/P is well controlled.  . CMP ordered to check renal function.  . Continue with current medications  . Continue follow up with Nephrology - CMP14+EGFR  4. Erectile dysfunction, unspecified  erectile dysfunction type - sildenafil (REVATIO) 20 MG tablet; Take 1 tablet by mouth daily as needed  Dispense: 10 tablet; Refill: 2  6. Prostate cancer [facility])  He is to be seen at [facility] for another opinion  It has bee recommended for him to have treatment for this  7. Atherosclerosis of aorta (HCC)  Will check his lipid panel  He is to also continue atorvastatin  - Lipid panel   I discussed with him again at great detail the importance to have the second Covid vaccination and he continues to decline and state he has natural antibodies.   Patient was given opportunity to ask questions. Patient verbalized understanding of the plan and was able to repeat key elements of the plan. All questions were answered to their satisfaction.   Teola Bradley, [name], have reviewed all documentation for this visit. The documentation on 12/26/19 for the exam, diagnosis, procedures, and orders are all accurate and complete.   THE PATIENT IS ENCOURAGED TO PRACTICE SOCIAL DISTANCING DUE TO THE COVID-19 PANDEMIC.

## 2019-12-14 LAB — CMP14+EGFR
ALT: 33 IU/L (ref 0–44)
AST: 25 IU/L (ref 0–40)
Albumin/Globulin Ratio: 1.6 (ref 1.2–2.2)
Albumin: 4.5 g/dL (ref 3.8–4.8)
Alkaline Phosphatase: 137 IU/L — ABNORMAL HIGH (ref 44–121)
BUN/Creatinine Ratio: 23 (ref 10–24)
BUN: 33 mg/dL — ABNORMAL HIGH (ref 8–27)
Bilirubin Total: 0.3 mg/dL (ref 0.0–1.2)
CO2: 18 mmol/L — ABNORMAL LOW (ref 20–29)
Calcium: 9.8 mg/dL (ref 8.6–10.2)
Chloride: 102 mmol/L (ref 96–106)
Creatinine, Ser: 1.41 mg/dL — ABNORMAL HIGH (ref 0.76–1.27)
GFR calc Af Amer: 60 mL/min/1.73
GFR calc non Af Amer: 52 mL/min/1.73 — ABNORMAL LOW
Globulin, Total: 2.8 g/dL (ref 1.5–4.5)
Glucose: 153 mg/dL — ABNORMAL HIGH (ref 65–99)
Potassium: 4.8 mmol/L (ref 3.5–5.2)
Sodium: 140 mmol/L (ref 134–144)
Total Protein: 7.3 g/dL (ref 6.0–8.5)

## 2019-12-14 LAB — LIPID PANEL
Chol/HDL Ratio: 5.9 ratio — ABNORMAL HIGH (ref 0.0–5.0)
Cholesterol, Total: 220 mg/dL — ABNORMAL HIGH (ref 100–199)
HDL: 37 mg/dL — ABNORMAL LOW (ref >39)
LDL Chol Calc (NIH): 132 mg/dL — ABNORMAL HIGH (ref 0–99)
Triglycerides: 282 mg/dL — ABNORMAL HIGH (ref 0–149)
VLDL Cholesterol Cal: 51 mg/dL — ABNORMAL HIGH (ref 5–40)

## 2019-12-14 LAB — HEMOGLOBIN A1C
Est. average glucose Bld gHb Est-mCnc: 174 mg/dL
Hgb A1c MFr Bld: 7.7 % — ABNORMAL HIGH (ref 4.8–5.6)

## 2019-12-16 ENCOUNTER — Encounter: Payer: Medicare Other | Admitting: Gastroenterology

## 2019-12-18 ENCOUNTER — Other Ambulatory Visit: Payer: Self-pay | Admitting: Nurse Practitioner

## 2019-12-18 DIAGNOSIS — N529 Male erectile dysfunction, unspecified: Secondary | ICD-10-CM

## 2019-12-22 ENCOUNTER — Telehealth: Payer: Self-pay

## 2019-12-22 DIAGNOSIS — Z79899 Other long term (current) drug therapy: Secondary | ICD-10-CM | POA: Diagnosis not present

## 2019-12-22 DIAGNOSIS — E1140 Type 2 diabetes mellitus with diabetic neuropathy, unspecified: Secondary | ICD-10-CM | POA: Diagnosis not present

## 2019-12-22 DIAGNOSIS — E1169 Type 2 diabetes mellitus with other specified complication: Secondary | ICD-10-CM | POA: Diagnosis not present

## 2019-12-22 DIAGNOSIS — E1122 Type 2 diabetes mellitus with diabetic chronic kidney disease: Secondary | ICD-10-CM | POA: Diagnosis not present

## 2019-12-22 DIAGNOSIS — J1282 Pneumonia due to coronavirus disease 2019: Secondary | ICD-10-CM | POA: Diagnosis not present

## 2019-12-22 DIAGNOSIS — M109 Gout, unspecified: Secondary | ICD-10-CM | POA: Diagnosis not present

## 2019-12-22 DIAGNOSIS — E785 Hyperlipidemia, unspecified: Secondary | ICD-10-CM | POA: Diagnosis not present

## 2019-12-22 DIAGNOSIS — I129 Hypertensive chronic kidney disease with stage 1 through stage 4 chronic kidney disease, or unspecified chronic kidney disease: Secondary | ICD-10-CM | POA: Diagnosis not present

## 2019-12-22 DIAGNOSIS — D509 Iron deficiency anemia, unspecified: Secondary | ICD-10-CM | POA: Diagnosis not present

## 2019-12-22 DIAGNOSIS — K5900 Constipation, unspecified: Secondary | ICD-10-CM | POA: Diagnosis not present

## 2019-12-22 DIAGNOSIS — U071 COVID-19: Secondary | ICD-10-CM | POA: Diagnosis not present

## 2019-12-22 DIAGNOSIS — E871 Hypo-osmolality and hyponatremia: Secondary | ICD-10-CM | POA: Diagnosis not present

## 2019-12-22 DIAGNOSIS — K219 Gastro-esophageal reflux disease without esophagitis: Secondary | ICD-10-CM | POA: Diagnosis not present

## 2019-12-22 DIAGNOSIS — N1832 Chronic kidney disease, stage 3b: Secondary | ICD-10-CM | POA: Diagnosis not present

## 2019-12-22 NOTE — Chronic Care Management (AMB) (Signed)
Chronic Care Management Pharmacy Assistant   Name: [name]  MRN: [id number] DOB: September 17, 1955  Reason for Encounter: Disease State -  Diabetes, Hypertension and Lipids Adherence Call.  Patient Questions:  1.  Have you seen any other providers since your last visit?  12/13/2019 - Janece Moore,[name] - [facility]   2.  Any changes in your medicines or health? No   PCP : [name], [name]  Allergies:  No Known Allergies  Medications: Outpatient Encounter Medications as of 12/22/2019  Medication Sig  . acetaminophen (TYLENOL) 500 MG tablet Take 500 mg by mouth every 6 (six) hours as needed for mild pain or moderate pain.  Marland Kitchen alfuzosin (UROXATRAL) 10 MG 24 hr tablet Take 10 mg by mouth every morning.  Marland Kitchen aspirin EC 81 MG tablet Take 81 mg by mouth daily.  Marland Kitchen atorvastatin (LIPITOR) 20 MG tablet TAKE 1 TABLET BY MOUTH EVERY DAY  . gabapentin (NEURONTIN) 100 MG capsule Take 100 mg by mouth at bedtime.  Marland Kitchen guaiFENesin (MUCINEX) 600 MG 12 hr tablet Take 1 tablet (600 mg total) by mouth 2 (two) times daily.  Marland Kitchen HYDROcodone-acetaminophen (NORCO) 5-325 MG tablet Take 1 tablet by mouth every 6 (six) hours as needed for moderate pain. (Patient not taking: Reported on 12/13/2019)  . metoprolol tartrate (LOPRESSOR) 50 MG tablet TAKE 1 TABLET BY MOUTH EVERY DAY  . Multiple Vitamin (MULTIVITAMIN) tablet Take 1 tablet by mouth daily.  Marland Kitchen MYRBETRIQ 25 MG TB24 tablet TAKE 1 TABLET BY MOUTH EVERY DAY  . nystatin (NYSTATIN) powder Apply 1 application topically 3 (three) times daily.  Marland Kitchen olmesartan-hydrochlorothiazide (BENICAR HCT) 40-25 MG tablet Take 1 tablet by mouth daily.  . RYBELSUS 14 MG TABS TAKE 1 TABLET BY MOUTH DAILY. 30 MINUTES BEFORE BREAKFAST  . sildenafil (REVATIO) 20 MG tablet Take 1 tablet by mouth daily as needed  . TRADJENTA 5 MG TABS tablet TAKE 1 TABLET BY MOUTH EVERY DAY  . traMADol (ULTRAM) 50 MG tablet Take 1 tablet (50 mg total) by mouth every 6 (six) hours as needed for severe  pain.  . traZODone (DESYREL) 50 MG tablet Take 1 tablet (50 mg total) by mouth at bedtime as needed for sleep.   No facility-administered encounter medications on file as of 12/22/2019.    Current Diagnosis: Patient Active Problem List   Diagnosis Date Noted  . Pneumonia due to COVID-19 virus 09/29/2019  . Acute on chronic kidney failure ([place]) 09/28/2019  . Hyponatremia 09/28/2019  . Sinus tachycardia 09/28/2019  . Insomnia 08/26/2018  . Nephropathy 03/26/2018  . Urinary urgency 03/26/2018  . Type 2 diabetes mellitus with diabetic nephropathy, without long-term current use of insulin ([place]) 03/26/2018  . Essential hypertension 03/26/2018  . Chronic kidney disease (CKD), stage III (moderate) ([place]) 03/26/2018  . Conductive hearing loss, bilateral 11/07/2017  . Uncontrolled type 2 diabetes mellitus with hyperglycemia ([place]) 07/31/2016  . Dyslipidemia associated with type 2 diabetes mellitus ([place]) 05/29/2016  . Microcytic anemia 02/17/2012  . 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 Benign hypertension 02/17/2012  . Depressive disorder 02/17/2012    Recent Relevant Labs: Lab Results  Component Value Date/Time   HGBA1C 7.7 (H) 12/13/2019 03:13 PM   HGBA1C 7.8 (H) 08/26/2019 03:42 PM   HGBA1C 7.6 06/26/2016 12:00 AM   HGBA1C 7.6 06/26/2016 12:00 AM   MICROALBUR 80 08/26/2019 03:15 PM   MICROALBUR 150 03/26/2018 11:57 AM    Kidney Function Lab Results  Component Value Date/Time   CREATININE 1.41 (H) 12/13/2019 03:13  PM   CREATININE 1.69 (H) 10/05/2019 02:09 AM   GFRNONAA 52 (L) 12/13/2019 03:13 PM   GFRAA 60 12/13/2019 03:13 PM    . Current antihyperglycemic regimen:   o Rybelsus 14 mg daily 30 minutes before breakfast o Tradjenta 5 mg daily  . What recent interventions/DTPs have been made to improve glycemic control:  o Patient states he takes his medication as directed by provider  . Have there been any recent hospitalizations or ED visits since last visit with CPP?   No  . Patient denies hypoglycemic symptoms, including Pale, Sweaty, Shaky, Hungry and Vision changes . Patient denies hyperglycemic symptoms, including blurry vision, excessive thirst, fatigue, polyuria and weakness   . How often are you checking your blood sugar? Patient states he takes blood sugar once a day.  . What are your blood sugars ranging? Patient states his blood sugars have been good.  o Fasting: none o Before meals: none o After meals: 12/23/2019 - 130 o Bedtime: none  . During the week, how often does your blood glucose drop below 70? No  . Are you checking your feet daily/regularly? Patient denies any sores, numbness or tingling or pain.  Adherence Review: Is the patient currently on a STATIN medication? Yes. Atorvastatin 20 mg daily Is the patient currently on ACE/ARB medication? Yes. Olmesartan Does the patient have >5 day gap between last estimated fill dates? No   Reviewed chart prior to disease state call. Spoke with patient regarding BP  Recent Office Vitals: BP Readings from Last 3 Encounters:  12/13/19 126/84  10/05/19 120/71  08/26/19 132/90   Pulse Readings from Last 3 Encounters:  12/13/19 98  10/05/19 66  08/26/19 76    Wt Readings from Last 3 Encounters:  12/13/19 193 lb 6.4 oz (87.7 kg)  10/03/19 176 lb 5.9 oz (80 kg)  08/26/19 190 lb 11.2 oz (86.5 kg)     Kidney Function Lab Results  Component Value Date/Time   CREATININE 1.41 (H) 12/13/2019 03:13 PM   CREATININE 1.69 (H) 10/05/2019 02:09 AM   GFRNONAA 52 (L) 12/13/2019 03:13 PM   GFRAA 60 12/13/2019 03:13 PM    BMP Latest Ref Rng & Units 12/13/2019 10/05/2019 10/04/2019  Glucose 65 - 99 mg/dL 153(H) 195(H) 142(H)  BUN 8 - 27 mg/dL 33(H) 29(H) 41(H)  Creatinine 0.76 - 1.27 mg/dL 1.41(H) 1.69(H) 1.75(H)  BUN/Creat Ratio 10 - 24 23 - -  Sodium 134 - 144 mmol/L 140 136 138  Potassium 3.5 - 5.2 mmol/L 4.8 4.6 5.0  Chloride 96 - 106 mmol/L 102 99 104  CO2 20 - 29 mmol/L 18(L) 30 25   Calcium 8.6 - 10.2 mg/dL 9.8 9.1 9.2    . Current antihypertensive regimen:   o Metoprolol Tartrate 50 mg dialy o Olmesartan / HCTZ 40/25 mg daily  . How often are you checking your Blood Pressure? Once a day   . Current home BP readings: 12/23/2019 130/87  . What recent interventions/DTPs have been made by any provider to improve Blood Pressure control since last CPP Visit: Patient states he does take his medications as directed by provider.  . Any recent hospitalizations or ED visits since last visit with CPP? No  . What diet changes have been made to improve Blood Pressure Control?  o Patient states he has cut back on red meats / fried foods / He states he eats  chicken, tuna and green vegetables. . What exercise is being done to improve your Blood Pressure Control?  Patient states he walks 15 minutes a day.  Adherence Review:  Is the patient currently on ACE/ARB medication? Yes, Olmsartan  Does the patient have >5 day gap between last estimated fill dates? No   Comprehensive medication review performed; Spoke to patient regarding cholesterol  Lipid Panel    Component Value Date/Time   CHOL 220 (H) 12/13/2019 1536   TRIG 282 (H) 12/13/2019 1536   HDL 37 (L) 12/13/2019 1536   LDLCALC 132 (H) 12/13/2019 1536    10-year ASCVD risk score: The 10-year ASCVD risk score Mikey Bussing DC Brooke Bonito., et al., 2013) is: 32%   Values used to calculate the score:     Age: 77 years     Sex: Male     Is Non-Hispanic African American: No     Diabetic: Yes     Tobacco smoker: No     Systolic Blood Pressure: 132 mmHg     Is BP treated: Yes     HDL Cholesterol: 37 mg/dL     Total Cholesterol: 220 mg/dL  . Current antihyperlipidemic regimen:  o Atorvastatin 20 mg daily  . Previous antihyperlipidemic medications tried: none  . ASCVD risk enhancing conditions: age >81, DM, HTN, CKD, CHF, current smoker  . What recent interventions/DTPs have been made by any provider to improve  Cholesterol control since last CPP Visit: Patient states he takes his medications as directed.   . Any recent hospitalizations or ED visits since last visit with CPP? No  . What diet changes have been made to improve Cholesterol?          Patient states he has cut back on red meats / fried foods. He states he eats  chicken, tuna and green vegetables.                   . What exercise is being done to improve Cholesterol?  o Patient states  he walks about 15 minutes a day.  Adherence Review: Does the patient have >5 day gap between last estimated fill dates? No        Goals Addressed            This Visit's Progress   . Pharmacy Care Plan   On track    CARE PLAN ENTRY  Current Barriers:  . Chronic Disease Management support, education, and care coordination needs related to Hypertension, Hyperlipidemia, Diabetes, and Chronic Kidney Disease   Hypertension . Pharmacist Clinical Goal(s): o Over the next 90 days, patient will work with PharmD and providers to maintain BP goal <130/80 . Current regimen:  o Metoprolol tartrate 50mg  daily o Olmesartan/ HCTZ 40/25mg  daily . Interventions: o Provided dietary and exercise recommendations . Patient self care activities - Over the next 90 days, patient will: o Check BP weekly, document, and provide at future appointments o Ensure daily salt intake < 2300 mg/day o Choose low-sodium/no-sodium canned goods o Exercise 30 minutes 5 times weekly  Diabetes . Pharmacist Clinical Goal(s): o Over the next 90 days, patient will work with PharmD and providers to achieve A1c goal <7% . Current regimen:  o Rybelsus 14mg  daily 30 minutes before breakfast o Tradjenta 5mg  daily . Interventions: o Provided dietary and exercise recommendations - Discussed diet and exercise in detail o Advised patient to check blood sugar first thing in the morning before breakfast o Discussed appropriate goal for Hemoglobin A1c less than 7% . Patient self  care activities - Over the next 90 days, patient will: o Check blood sugar  once daily, document, and provide at future appointments o Contact provider with any episodes of hypoglycemia o Increase exercise with goal of 30 minutes 5 times weekly o Increase water intake to 64 ounces daily  Hyperlipidemia Lab Results  Component Value Date/Time   LDLCALC 106 (H) 08/26/2019 03:42 PM   . Pharmacist Clinical Goal(s): o Over the next 90 days, patient will work with PharmD and providers to achieve LDL goal < 70 . Current regimen:  o Atorvastatin 20mg  daily . Interventions: o Provided dietary and exercise recommendations o Recommend to PCP increasing atorvastatin 20mg  to atorvastatin 40mg  daily due to LDL >100, ASCVD risk, diabetes, and CKD . Patient self care activities - Over the next 90 days, patient will: o Take cholesterol medication daily as directed o Increase exercise with goal of 30 minutes 5 times weekly  Medication management . Pharmacist Clinical Goal(s): o Over the next 90 days, patient will work with PharmD and providers to achieve optimal medication adherence . Current pharmacy: [facility] . Interventions o Comprehensive medication review performed. o Continue current medication management strategy o Discussed medication synchronization , adherence packaging and delivery available with [facility] . Patient self care activities - Over the next 90 days, patient will: o Focus on medication adherence by using pill box o Consider medication synchronization and pill packaging program as outlined be PharmD and notify PharmD if interested o Take medications as prescribed o Report any questions or concerns to PharmD and/or provider(s)  Please see past updates related to this goal by clicking on the "Past Updates" button in the selected goal         Follow-Up:  Pharmacist Review -   Beverly Milch, CPP. Notified  Judithann Sheen, [place] Pharmacist  Assistant [phone]

## 2019-12-23 ENCOUNTER — Telehealth: Payer: Self-pay

## 2019-12-23 NOTE — Telephone Encounter (Signed)
[name] with [facility] called stating pt has been discharged from home health with goals met. If you have any questions for her we can call her at [phone] [facility]

## 2019-12-27 DIAGNOSIS — L304 Erythema intertrigo: Secondary | ICD-10-CM | POA: Diagnosis not present

## 2019-12-27 DIAGNOSIS — L814 Other melanin hyperpigmentation: Secondary | ICD-10-CM | POA: Diagnosis not present

## 2019-12-27 DIAGNOSIS — L578 Other skin changes due to chronic exposure to nonionizing radiation: Secondary | ICD-10-CM | POA: Diagnosis not present

## 2019-12-27 DIAGNOSIS — L209 Atopic dermatitis, unspecified: Secondary | ICD-10-CM | POA: Diagnosis not present

## 2019-12-27 DIAGNOSIS — L570 Actinic keratosis: Secondary | ICD-10-CM | POA: Diagnosis not present

## 2019-12-27 DIAGNOSIS — D2271 Melanocytic nevi of right lower limb, including hip: Secondary | ICD-10-CM | POA: Diagnosis not present

## 2019-12-29 ENCOUNTER — Other Ambulatory Visit: Payer: Self-pay | Admitting: Nurse Practitioner

## 2020-01-04 ENCOUNTER — Telehealth: Payer: Medicare Other

## 2020-01-04 ENCOUNTER — Telehealth: Payer: Self-pay

## 2020-01-04 NOTE — Telephone Encounter (Signed)
  Chronic Care Management   Outreach Note  01/04/2020 Name: [name] MRN: [id number] DOB: March 23, 1955  Referred by: [name], [name] Reason for referral : Care Coordination   An unsuccessful telephone outreach was attempted today. The patient was referred to the case management team for assistance with care management and care coordination.   Follow Up Plan: A HIPAA compliant phone message was left for the patient providing contact information and requesting a return call.  The care management team will reach out to the patient again over the next 21 days.   [name], BSW, CDP Social Worker, Certified Dementia Practitioner [place] [facility] [place] [facility] [phone]

## 2020-01-05 DIAGNOSIS — N261 Atrophy of kidney (terminal): Secondary | ICD-10-CM | POA: Diagnosis not present

## 2020-01-05 DIAGNOSIS — N138 Other obstructive and reflux uropathy: Secondary | ICD-10-CM | POA: Diagnosis not present

## 2020-01-11 ENCOUNTER — Telehealth: Payer: Medicare Other

## 2020-01-11 ENCOUNTER — Telehealth: Payer: Self-pay

## 2020-01-11 NOTE — Telephone Encounter (Cosign Needed)
°  Chronic Care Management   Outreach Note  01/11/2020 Name: [name] MRN: [id number] DOB: 1955-08-16  Referred by: [name], [name] Reason for referral : Chronic Care Management (CCM RNCM FU Call )   A second unsuccessful telephone outreach was attempted today. The patient was referred to the case management team for assistance with care management and care coordination.   Follow Up Plan: A HIPAA compliant phone message was left for the patient providing contact information and requesting a return call.  Telephone follow up appointment with care management team member scheduled for: 02/22/20  Barb Merino, RN, BSN, CCM Care Management Coordinator [place] [facility]  Direct Phone: [phone]

## 2020-01-17 ENCOUNTER — Telehealth: Payer: Self-pay

## 2020-01-17 NOTE — Chronic Care Management (AMB) (Deleted)
Chronic Care Management Pharmacy  Name: [name]  MRN: [id number] DOB: Jun 26, 1955  Chief Complaint/ HPI  [name],  64 y.o. , male presents for their Follow-Up CCM visit with the clinical pharmacist via telephone due to COVID-19 Pandemic.   PCP : [name], [name]  Their chronic conditions include: Hypertension, Diabetes, CKD, Dyslipidemia, Depressive disorder, Insomnia   Office Visits: 10/07/19 TV: Pt recently discharged from hospital after COVID pneumonia. Also found to have moderate to severe hydronephrosis, but refused nephrostomy tube. Ordered PT for strength and endurance. Have remote health contact pt. Recheck chest Xray August 9th. Advised pt to contact Urologist. Provided limited amount of Norco for right flank pain. Encouraged to stay well hydrated.   08/26/19 AWV and OV: Presented for DM follow up. Pt reported rash to right groin area. Pt tolerating Rybelsus and Tradjenta well. Labs ordered (HgbA1c). Pt encouraged to stay well hydrated and avoid NSAIDs. Sildenafil refilled. Started nystatin powder and oral fluconazole 100mg  daily for 5 days for erythema present to groin area. Referred to [facility] for second opinion regarding prostate cancer.   05/05/19 OV: Follow up diabetic visit. Labs ordered (HgbA1c, Lipid panel, CMP14+EGFR). Recommended to remain hydrated to help kidney function. Diet and exercise education provided. Started sildenafil 20mg  daily for erectile dysfunction.  03/25/19 Telemed visit: Presented for wheezing/non-productive cough over the past week. Started Mucinex as needed for chest congestion  02/02/19 OV: Follow up diabetic visit. BG was 226 in office. Diabetes is poorly controlled, started on Rybelsus 7mg  daily. Labs ordered (HgbA1c, CMP14+EGFR). Referred to CCM RN for diabetes education. Stage 3b CKD  Consult Visits: 09/28/19-10/05/19 ED admission: Presented with weakness, nausea and vomiting for about 1 week. Found to have mild/moderate  COVID/pneumonia. Completed steroids and Remdesivir course. Discharged symptom free. Pt refused nephrostomy tube placement in hospital. F/U with PCP and Urologist in 7 days.   09/07/19 Dermatology OV w/ Dr. Shaaron Adler: Presented as new pt for itching and burning on chest/back/groin. Pt has been going to tanning bed TIW which he thinks has helped. Used gold bond powder on groin area. Uses Ivory soap and Tide Free and Clear detergent. Decrease light therapy to 5 minutes since pt is very tan. Started gabapentin 100mg  at bedtime for itching and burning.  05/12/19 Telephone call: Sildenafil PA was denied by the insurance  05/11/19 OV w/ Urology w/ Dr. 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 Restart/ Continue alfuzosin 10mg  daily. PSA (13.03 H)and urine culture ordered. Follow up in 4 months.Previously on Rapaflo for LUTS. Myrbetriq started for urgency and urge incontinence.   CCM Encounters: 11/03/19 SW: No bed offers have been made at this time. Advised pt he could stay in home and wait for bed at [facility]. Pt states interested in [facility]. SW contact [facility], not currently doing tours in person, but willing to do a zoom call with patient.   10/29/19 SW: Per PCP, recommend pt get 2nd dose of Moderna COVID vaccine; pt states he is not considering at this time due to side effects from the first shot. ALF may not allow placement if not fully vaccinated. Pt declined bed offer from [facility] and [facility] right now. On waiting list for [facility] (1st choice).  10/27/19 SW: Reviewed current bed offers with patient [facility] and [facility])  09/24/19 SW: Contacted pt to notify of paperwork sent to provider for [facility] placement. Pt states he no longer  wants to move into [facility]. Pt would like to consider other ALFs.   09/22/19 RN and SW: Pt reported nausea, vomiting and fatigue for past 5 days  after receiving first COVID vaccine. Collaborate/notify PCP. Outreach to [facility] assisted living facility to discuss steps for placement. Collaborate with PCP for completion of paperwork.   09/13/19 RN: Evaluated current treatment plans with patient. Provided pt education  05/10/19 PharmD: Encouraged pt to check BG at home. Metformin was d/c by nephrologist due to kidney function. Counseled pt on correct administration of Rybelsus.   04/22/19 RN: Care plan established for chronic disease states.   03/05/19 SW: CCM consent obtained  Medications: Outpatient Encounter Medications as of 01/17/2020  Medication Sig   acetaminophen (TYLENOL) 500 MG tablet Take 500 mg by mouth every 6 (six) hours as needed for mild pain or moderate pain.   alfuzosin (UROXATRAL) 10 MG 24 hr tablet Take 10 mg by mouth every morning.   aspirin EC 81 MG tablet Take 81 mg by mouth daily.   atorvastatin (LIPITOR) 20 MG tablet TAKE 1 TABLET BY MOUTH EVERY DAY   gabapentin (NEURONTIN) 100 MG capsule Take 100 mg by mouth at bedtime.   guaiFENesin (MUCINEX) 600 MG 12 hr tablet Take 1 tablet (600 mg total) by mouth 2 (two) times daily.   HYDROcodone-acetaminophen (NORCO) 5-325 MG tablet Take 1 tablet by mouth every 6 (six) hours as needed for moderate pain. (Patient not taking: Reported on 12/13/2019)   metoprolol tartrate (LOPRESSOR) 50 MG tablet TAKE 1 TABLET BY MOUTH EVERY DAY   Multiple Vitamin (MULTIVITAMIN) tablet Take 1 tablet by mouth daily.   MYRBETRIQ 25 MG TB24 tablet TAKE 1 TABLET BY MOUTH EVERY DAY   nystatin (NYSTATIN) powder Apply 1 application topically 3 (three) times daily.   olmesartan-hydrochlorothiazide (BENICAR HCT) 40-25 MG tablet Take 1 tablet by mouth daily.   RYBELSUS 14 MG TABS TAKE 1 TABLET BY MOUTH DAILY. 30 MINUTES BEFORE BREAKFAST   sildenafil (REVATIO) 20 MG tablet Take 1 tablet by mouth daily as needed   TRADJENTA 5 MG TABS tablet TAKE 1 TABLET BY MOUTH EVERY DAY   traMADol  (ULTRAM) 50 MG tablet Take 1 tablet (50 mg total) by mouth every 6 (six) hours as needed for severe pain.   traZODone (DESYREL) 50 MG tablet Take 1 tablet (50 mg total) by mouth at bedtime as needed for sleep.   No facility-administered encounter medications on file as of 01/17/2020.   Current Diagnosis/Assessment:   Goals Addressed   None     Diabetes   Recent Relevant Labs: Lab Results  Component Value Date/Time   HGBA1C 7.7 (H) 12/13/2019 03:13 PM   HGBA1C 7.8 (H) 08/26/2019 03:42 PM   HGBA1C 7.6 06/26/2016 12:00 AM   HGBA1C 7.6 06/26/2016 12:00 AM   MICROALBUR 80 08/26/2019 03:15 PM   MICROALBUR 150 03/26/2018 11:57 AM   Kidney Function Lab Results  Component Value Date/Time   CREATININE 1.41 (H) 12/13/2019 03:13 PM   CREATININE 1.69 (H) 10/05/2019 02:09 AM   GFRNONAA 52 (L) 12/13/2019 03:13 PM   GFRAA 60 12/13/2019 03:13 PM  Stage 3b CKD   Checking BG: Daily  Recent Random BG Readings:  Patient has failed these meds in past: Metformin Patient is currently uncontrolled but improved on the following medications:   Rybelsus $RemoveB'14mg'uvKdnMwm$  daily 30 minutes before breakfast  Tradjenta $RemoveBe'5mg'LLUdMpCOY$  daily  We discussed:   Diet extensively o Scrambled eggs, whole wheat toast, and [place] bacon o Sandwiches, peanut butter  and crackers o Sugar-free wafers and sherbet o Chicken,beef, vegetables, brown rice o Drinks 3-4 bottles of water daily (Recommend pt drink 64 ounces of water daily)  Exercise extensively o Pt has been able to walk some (short walks) 20-30 minutes - Limited due to back pain  Goal HgbA1c less than 7%  Plan Continue current medications   Hypertension   Office blood pressures are  BP Readings from Last 3 Encounters:  12/13/19 126/84  10/05/19 120/71  08/26/19 132/90   Patient has failed these meds in the past: Amlodipine, diltiazem ER, losartan/HCTZ, Metoprolol succinate, olmesartan Patient is currently controlled on the following medications:    Metoprolol tartrate 50mg  daily  Olmesartan/ HCTZ 40/25mg  daily  Patient checks BP at home infrequently  Patient home BP readings are ranging:  We discussed:  Pt said BP has been good  Plan Continue current medications   Hyperlipidemia   LDL goal <70  Lipid Panel     Component Value Date/Time   CHOL 220 (H) 12/13/2019 1536   TRIG 282 (H) 12/13/2019 1536   HDL 37 (L) 12/13/2019 1536   CHOLHDL 5.9 (H) 12/13/2019 1536   CHOLHDL 6.2 04/19/2008 0430   VLDL 26 04/19/2008 0430   LDLCALC 132 (H) 12/13/2019 1536   LABVLDL 51 (H) 12/13/2019 1536     The 10-year ASCVD risk score Mikey Bussing DC Jr., et al., 2013) is: 34.3%   Values used to calculate the score:     Age: 62 years     Sex: Male     Is Non-Hispanic African American: No     Diabetic: Yes     Tobacco smoker: No     Systolic Blood Pressure: 962 mmHg     Is BP treated: Yes     HDL Cholesterol: 37 mg/dL     Total Cholesterol: 220 mg/dL   Patient has failed these meds in past: N/A Patient is currently uncontrolled on the following medications:   Atorvastatin 20mg  daily  Aspirin 81 mg daily  We discussed:  Diet and exercise extensively  Goals for LDL (<70), HDL (>40) and triglycerides (<150)  Possibility of increasing to atorvastatin 40mg  daily if PCP approves to reduce LDL  Plan Continue current medications  Recommend increasing to atorvastatin 40mg  daily to PCP  Insomnia   Patient has failed these meds in past: Zolpidem Patient is currently controlled on the following medications:   Trazodone 50mg  daily at bedtime  Plan Continue current medications   BPH   Patient has failed these meds in past: Flomax Patient is currently controlled on the following medications:   Alfuzosin 10mg  every morning  Plan Continue current medications  Neuropathy   Patient has failed these meds in past: N/A Patient is currently controlled on the following medications:   Gabapentin 100mg  at  bedtime  Plan Continue current medications  Overactive Bladder    Patient has failed these meds in past: N/A Patient is currently controlled on the following medications:   Myrbetriq 25mg  daily  Plan Continue current medications   CKD   Kidney Function Lab Results  Component Value Date/Time   CREATININE 1.41 (H) 12/13/2019 03:13 PM   CREATININE 1.69 (H) 10/05/2019 02:09 AM   GFRNONAA 52 (L) 12/13/2019 03:13 PM   GFRAA 60 12/13/2019 03:13 PM   K 4.8 12/13/2019 03:13 PM   K 4.6 10/05/2019 02:09 AM   Plan Continue care with Nephrologist  Vaccines   Reviewed and discussed patient's vaccination history.  No [facility] records available aside from 2013  influenza vaccine.  Immunization History  Administered Date(s) Administered   Influenza Split 12/04/2009   PPD Test 05/29/2016   Plan Discuss at follow up visit  Medication Management   Pt uses [facility] for all medications Uses pill box? No - Pt has them organized in cabinet and that works for him Pt endorses 100% compliance  We discussed:   Importance of medication adherence  Pt interested in [facility]; states he will let us know after Labor Day about pharmacy  Plan Continue current medication management strategy  Discuss [facility] pharmacy services at follow up visit  Follow up: 8 week phone visit  Jannette Fogo, PharmD Clinical Pharmacist [facility] [phone]

## 2020-01-18 ENCOUNTER — Telehealth: Payer: Self-pay

## 2020-01-18 ENCOUNTER — Other Ambulatory Visit: Payer: Self-pay | Admitting: Nurse Practitioner

## 2020-01-18 DIAGNOSIS — N529 Male erectile dysfunction, unspecified: Secondary | ICD-10-CM

## 2020-01-18 NOTE — Telephone Encounter (Signed)
I returned pt call and left him a voicemail letting him know that his refill of sildenafil has been sent to the pharmacy. YL,RMA

## 2020-01-19 DIAGNOSIS — L570 Actinic keratosis: Secondary | ICD-10-CM | POA: Diagnosis not present

## 2020-01-19 DIAGNOSIS — L304 Erythema intertrigo: Secondary | ICD-10-CM | POA: Diagnosis not present

## 2020-01-19 DIAGNOSIS — L821 Other seborrheic keratosis: Secondary | ICD-10-CM | POA: Diagnosis not present

## 2020-01-19 DIAGNOSIS — Z85828 Personal history of other malignant neoplasm of skin: Secondary | ICD-10-CM | POA: Diagnosis not present

## 2020-01-19 DIAGNOSIS — L853 Xerosis cutis: Secondary | ICD-10-CM | POA: Diagnosis not present

## 2020-01-21 ENCOUNTER — Telehealth: Payer: Self-pay

## 2020-01-21 ENCOUNTER — Telehealth: Payer: Medicare Other

## 2020-01-21 NOTE — Telephone Encounter (Signed)
°  Chronic Care Management   Outreach Note  01/21/2020 Name: [name] MRN: [id number] DOB: [birth year]  Referred by: [name], [name] Reason for referral : Care Coordination   An unsuccessful telephone outreach was attempted today. The patient was referred to the case management team for assistance with care management and care coordination.   Follow Up Plan: A HIPAA compliant phone message was left for the patient providing contact information and requesting a return call.  The care management team will reach out to the patient again over the next 28 days.   [name], BSW, CDP Social Worker, Certified Dementia Practitioner [facility] [phone]

## 2020-01-25 ENCOUNTER — Telehealth: Payer: Medicare Other

## 2020-01-26 DIAGNOSIS — N261 Atrophy of kidney (terminal): Secondary | ICD-10-CM | POA: Diagnosis not present

## 2020-01-26 DIAGNOSIS — N138 Other obstructive and reflux uropathy: Secondary | ICD-10-CM | POA: Diagnosis not present

## 2020-02-10 ENCOUNTER — Emergency Department (HOSPITAL_COMMUNITY): Payer: Medicare Other

## 2020-02-10 ENCOUNTER — Other Ambulatory Visit: Payer: Self-pay

## 2020-02-10 ENCOUNTER — Inpatient Hospital Stay (HOSPITAL_COMMUNITY)
Admission: EM | Admit: 2020-02-10 | Discharge: 2020-02-12 | DRG: 684 | Disposition: A | Discharge status: Home Health | Payer: Medicare Other | Attending: Internal Medicine | Admitting: Internal Medicine

## 2020-02-10 ENCOUNTER — Encounter (HOSPITAL_COMMUNITY): Payer: Self-pay | Admitting: Internal Medicine

## 2020-02-10 DIAGNOSIS — R1084 Generalized abdominal pain: Secondary | ICD-10-CM | POA: Diagnosis not present

## 2020-02-10 DIAGNOSIS — N179 Acute kidney failure, unspecified: Secondary | ICD-10-CM | POA: Diagnosis not present

## 2020-02-10 DIAGNOSIS — Z9119 Patient's noncompliance with other medical treatment and regimen: Secondary | ICD-10-CM

## 2020-02-10 DIAGNOSIS — Z79899 Other long term (current) drug therapy: Secondary | ICD-10-CM | POA: Diagnosis not present

## 2020-02-10 DIAGNOSIS — M109 Gout, unspecified: Secondary | ICD-10-CM | POA: Diagnosis present

## 2020-02-10 DIAGNOSIS — Z801 Family history of malignant neoplasm of trachea, bronchus and lung: Secondary | ICD-10-CM | POA: Diagnosis not present

## 2020-02-10 DIAGNOSIS — I129 Hypertensive chronic kidney disease with stage 1 through stage 4 chronic kidney disease, or unspecified chronic kidney disease: Secondary | ICD-10-CM | POA: Diagnosis not present

## 2020-02-10 DIAGNOSIS — N189 Chronic kidney disease, unspecified: Secondary | ICD-10-CM | POA: Diagnosis not present

## 2020-02-10 DIAGNOSIS — H3530 Unspecified macular degeneration: Secondary | ICD-10-CM | POA: Diagnosis present

## 2020-02-10 DIAGNOSIS — Z833 Family history of diabetes mellitus: Secondary | ICD-10-CM | POA: Diagnosis not present

## 2020-02-10 DIAGNOSIS — C61 Malignant neoplasm of prostate: Secondary | ICD-10-CM | POA: Diagnosis not present

## 2020-02-10 DIAGNOSIS — I499 Cardiac arrhythmia, unspecified: Secondary | ICD-10-CM | POA: Diagnosis not present

## 2020-02-10 DIAGNOSIS — R112 Nausea with vomiting, unspecified: Secondary | ICD-10-CM | POA: Diagnosis not present

## 2020-02-10 DIAGNOSIS — N1831 Chronic kidney disease, stage 3a: Secondary | ICD-10-CM | POA: Diagnosis present

## 2020-02-10 DIAGNOSIS — R531 Weakness: Secondary | ICD-10-CM | POA: Diagnosis not present

## 2020-02-10 DIAGNOSIS — Z8616 Personal history of COVID-19: Secondary | ICD-10-CM | POA: Diagnosis present

## 2020-02-10 DIAGNOSIS — K219 Gastro-esophageal reflux disease without esophagitis: Secondary | ICD-10-CM | POA: Diagnosis not present

## 2020-02-10 DIAGNOSIS — R0902 Hypoxemia: Secondary | ICD-10-CM | POA: Diagnosis not present

## 2020-02-10 DIAGNOSIS — Z8249 Family history of ischemic heart disease and other diseases of the circulatory system: Secondary | ICD-10-CM | POA: Diagnosis not present

## 2020-02-10 DIAGNOSIS — R000 Tachycardia, unspecified: Secondary | ICD-10-CM | POA: Diagnosis not present

## 2020-02-10 DIAGNOSIS — N1330 Unspecified hydronephrosis: Secondary | ICD-10-CM | POA: Diagnosis present

## 2020-02-10 DIAGNOSIS — Z800 Family history of malignant neoplasm of digestive organs: Secondary | ICD-10-CM | POA: Diagnosis not present

## 2020-02-10 DIAGNOSIS — E860 Dehydration: Secondary | ICD-10-CM | POA: Diagnosis not present

## 2020-02-10 DIAGNOSIS — Z743 Need for continuous supervision: Secondary | ICD-10-CM | POA: Diagnosis not present

## 2020-02-10 DIAGNOSIS — N1339 Other hydronephrosis: Secondary | ICD-10-CM | POA: Diagnosis not present

## 2020-02-10 DIAGNOSIS — Z7984 Long term (current) use of oral hypoglycemic drugs: Secondary | ICD-10-CM | POA: Diagnosis not present

## 2020-02-10 DIAGNOSIS — R109 Unspecified abdominal pain: Secondary | ICD-10-CM

## 2020-02-10 DIAGNOSIS — E1121 Type 2 diabetes mellitus with diabetic nephropathy: Secondary | ICD-10-CM | POA: Diagnosis not present

## 2020-02-10 DIAGNOSIS — J90 Pleural effusion, not elsewhere classified: Secondary | ICD-10-CM | POA: Diagnosis not present

## 2020-02-10 DIAGNOSIS — N132 Hydronephrosis with renal and ureteral calculous obstruction: Secondary | ICD-10-CM | POA: Diagnosis present

## 2020-02-10 DIAGNOSIS — E1122 Type 2 diabetes mellitus with diabetic chronic kidney disease: Secondary | ICD-10-CM | POA: Diagnosis present

## 2020-02-10 DIAGNOSIS — R29810 Facial weakness: Secondary | ICD-10-CM | POA: Diagnosis not present

## 2020-02-10 DIAGNOSIS — Z7982 Long term (current) use of aspirin: Secondary | ICD-10-CM

## 2020-02-10 DIAGNOSIS — Z808 Family history of malignant neoplasm of other organs or systems: Secondary | ICD-10-CM | POA: Diagnosis not present

## 2020-02-10 DIAGNOSIS — N261 Atrophy of kidney (terminal): Secondary | ICD-10-CM | POA: Diagnosis not present

## 2020-02-10 DIAGNOSIS — R0602 Shortness of breath: Secondary | ICD-10-CM | POA: Diagnosis not present

## 2020-02-10 DIAGNOSIS — Z20822 Contact with and (suspected) exposure to covid-19: Secondary | ICD-10-CM | POA: Diagnosis present

## 2020-02-10 HISTORY — DX: Chronic kidney disease, unspecified: N18.9

## 2020-02-10 HISTORY — DX: Chronic kidney disease, unspecified: N17.9

## 2020-02-10 LAB — URINALYSIS, ROUTINE W REFLEX MICROSCOPIC
Bacteria, UA: NONE SEEN
Bilirubin Urine: NEGATIVE
Glucose, UA: 50 mg/dL — AB
Ketones, ur: 5 mg/dL — AB
Leukocytes,Ua: NEGATIVE
Nitrite: NEGATIVE
Protein, ur: NEGATIVE mg/dL
Specific Gravity, Urine: 1.016 (ref 1.005–1.030)
pH: 5 (ref 5.0–8.0)

## 2020-02-10 LAB — COMPREHENSIVE METABOLIC PANEL WITH GFR
ALT: 18 U/L (ref 0–44)
AST: 20 U/L (ref 15–41)
Albumin: 3.9 g/dL (ref 3.5–5.0)
Alkaline Phosphatase: 74 U/L (ref 38–126)
Anion gap: 14 (ref 5–15)
BUN: 41 mg/dL — ABNORMAL HIGH (ref 8–23)
CO2: 19 mmol/L — ABNORMAL LOW (ref 22–32)
Calcium: 9.1 mg/dL (ref 8.9–10.3)
Chloride: 102 mmol/L (ref 98–111)
Creatinine, Ser: 2.2 mg/dL — ABNORMAL HIGH (ref 0.61–1.24)
GFR, Estimated: 32 mL/min — ABNORMAL LOW
Glucose, Bld: 157 mg/dL — ABNORMAL HIGH (ref 70–99)
Potassium: 4.1 mmol/L (ref 3.5–5.1)
Sodium: 135 mmol/L (ref 135–145)
Total Bilirubin: 1.3 mg/dL — ABNORMAL HIGH (ref 0.3–1.2)
Total Protein: 6.8 g/dL (ref 6.5–8.1)

## 2020-02-10 LAB — CBC WITH DIFFERENTIAL/PLATELET
Abs Immature Granulocytes: 0.03 K/uL (ref 0.00–0.07)
Basophils Absolute: 0 K/uL (ref 0.0–0.1)
Basophils Relative: 1 %
Eosinophils Absolute: 0.1 K/uL (ref 0.0–0.5)
Eosinophils Relative: 1 %
HCT: 52.6 % — ABNORMAL HIGH (ref 39.0–52.0)
Hemoglobin: 17 g/dL (ref 13.0–17.0)
Immature Granulocytes: 0 %
Lymphocytes Relative: 9 %
Lymphs Abs: 0.7 K/uL (ref 0.7–4.0)
MCH: 28.4 pg (ref 26.0–34.0)
MCHC: 32.3 g/dL (ref 30.0–36.0)
MCV: 87.8 fL (ref 80.0–100.0)
Monocytes Absolute: 0.6 K/uL (ref 0.1–1.0)
Monocytes Relative: 8 %
Neutro Abs: 6.3 K/uL (ref 1.7–7.7)
Neutrophils Relative %: 81 %
Platelets: 171 K/uL (ref 150–400)
RBC: 5.99 MIL/uL — ABNORMAL HIGH (ref 4.22–5.81)
RDW: 12.9 % (ref 11.5–15.5)
WBC: 7.7 K/uL (ref 4.0–10.5)
nRBC: 0 % (ref 0.0–0.2)

## 2020-02-10 LAB — GLUCOSE, CAPILLARY: Glucose-Capillary: 112 mg/dL — ABNORMAL HIGH (ref 70–99)

## 2020-02-10 LAB — RESP PANEL BY RT-PCR (FLU A&B, COVID) ARPGX2
Influenza A by PCR: NEGATIVE
Influenza B by PCR: NEGATIVE
SARS Coronavirus 2 by RT PCR: NEGATIVE

## 2020-02-10 MED ORDER — TETRAHYDROZOLINE HCL 0.05 % OP SOLN
1.0000 [drp] | Freq: Every day | OPHTHALMIC | Status: DC | PRN
  Filled 2020-02-10: qty 15

## 2020-02-10 MED ORDER — DROPERIDOL 2.5 MG/ML IJ SOLN
1.2500 mg | Freq: Once | INTRAMUSCULAR | Status: AC
  Administered 2020-02-10: 1.25 mg via INTRAVENOUS
  Filled 2020-02-10: qty 2

## 2020-02-10 MED ORDER — ASPIRIN EC 81 MG PO TBEC
81.0000 mg | DELAYED_RELEASE_TABLET | Freq: Every day | ORAL | Status: DC
  Administered 2020-02-11 – 2020-02-12 (×2): 81 mg via ORAL
  Filled 2020-02-10 (×2): qty 1

## 2020-02-10 MED ORDER — HEPARIN SODIUM (PORCINE) 5000 UNIT/ML IJ SOLN
5000.0000 [IU] | Freq: Three times a day (TID) | INTRAMUSCULAR | Status: DC
  Administered 2020-02-10 – 2020-02-11 (×2): 5000 [IU] via SUBCUTANEOUS
  Filled 2020-02-10 (×2): qty 1

## 2020-02-10 MED ORDER — TRAZODONE HCL 50 MG PO TABS
50.0000 mg | ORAL_TABLET | Freq: Every evening | ORAL | Status: DC | PRN
  Administered 2020-02-10 – 2020-02-11 (×2): 50 mg via ORAL
  Filled 2020-02-10 (×2): qty 1

## 2020-02-10 MED ORDER — MORPHINE SULFATE (PF) 4 MG/ML IV SOLN
4.0000 mg | Freq: Once | INTRAVENOUS | Status: AC
  Administered 2020-02-10: 4 mg via INTRAVENOUS
  Filled 2020-02-10: qty 1

## 2020-02-10 MED ORDER — NYSTATIN 100000 UNIT/GM EX POWD
1.0000 "application " | Freq: Two times a day (BID) | CUTANEOUS | Status: DC | PRN
  Filled 2020-02-10: qty 15

## 2020-02-10 MED ORDER — ONDANSETRON HCL 4 MG/2ML IJ SOLN
4.0000 mg | Freq: Once | INTRAMUSCULAR | Status: AC
  Administered 2020-02-10: 4 mg via INTRAVENOUS
  Filled 2020-02-10: qty 2

## 2020-02-10 MED ORDER — ATORVASTATIN CALCIUM 10 MG PO TABS
20.0000 mg | ORAL_TABLET | Freq: Every day | ORAL | Status: DC
  Administered 2020-02-11 – 2020-02-12 (×2): 20 mg via ORAL
  Filled 2020-02-10 (×2): qty 2

## 2020-02-10 MED ORDER — SODIUM CHLORIDE 0.9 % IV BOLUS
1000.0000 mL | Freq: Once | INTRAVENOUS | Status: AC
  Administered 2020-02-10: 1000 mL via INTRAVENOUS

## 2020-02-10 MED ORDER — ACETAMINOPHEN 650 MG RE SUPP
650.0000 mg | Freq: Four times a day (QID) | RECTAL | Status: DC | PRN
  Filled 2020-02-10: qty 1

## 2020-02-10 MED ORDER — MIRABEGRON ER 25 MG PO TB24
25.0000 mg | ORAL_TABLET | Freq: Every day | ORAL | Status: DC
  Administered 2020-02-11 – 2020-02-12 (×2): 25 mg via ORAL
  Filled 2020-02-10 (×3): qty 1

## 2020-02-10 MED ORDER — INSULIN ASPART 100 UNIT/ML ~~LOC~~ SOLN
0.0000 [IU] | Freq: Three times a day (TID) | SUBCUTANEOUS | Status: DC
  Administered 2020-02-11 – 2020-02-12 (×2): 1 [IU] via SUBCUTANEOUS

## 2020-02-10 MED ORDER — LINAGLIPTIN 5 MG PO TABS
5.0000 mg | ORAL_TABLET | Freq: Every day | ORAL | Status: DC
  Administered 2020-02-11 – 2020-02-12 (×2): 5 mg via ORAL
  Filled 2020-02-10 (×2): qty 1

## 2020-02-10 MED ORDER — GUAIFENESIN ER 600 MG PO TB12
600.0000 mg | ORAL_TABLET | Freq: Two times a day (BID) | ORAL | Status: DC
  Administered 2020-02-10 – 2020-02-12 (×4): 600 mg via ORAL
  Filled 2020-02-10 (×4): qty 1

## 2020-02-10 MED ORDER — SCOPOLAMINE 1 MG/3DAYS TD PT72
1.0000 | MEDICATED_PATCH | TRANSDERMAL | Status: DC
  Administered 2020-02-10: 1.5 mg via TRANSDERMAL
  Filled 2020-02-10 (×2): qty 1

## 2020-02-10 MED ORDER — ALFUZOSIN HCL ER 10 MG PO TB24
10.0000 mg | ORAL_TABLET | Freq: Every day | ORAL | Status: DC
  Administered 2020-02-11 – 2020-02-12 (×2): 10 mg via ORAL
  Filled 2020-02-10 (×3): qty 1

## 2020-02-10 MED ORDER — SODIUM CHLORIDE 0.9 % IV SOLN: INTRAVENOUS | Status: DC

## 2020-02-10 MED ORDER — ONDANSETRON HCL 4 MG/2ML IJ SOLN: 4.0000 mg | Freq: Four times a day (QID) | INTRAMUSCULAR | Status: DC | PRN

## 2020-02-10 MED ORDER — PROMETHAZINE HCL 25 MG/ML IJ SOLN
12.5000 mg | Freq: Once | INTRAMUSCULAR | Status: AC
  Administered 2020-02-10: 12.5 mg via INTRAVENOUS
  Filled 2020-02-10: qty 1

## 2020-02-10 MED ORDER — ACETAMINOPHEN 325 MG PO TABS
650.0000 mg | ORAL_TABLET | Freq: Four times a day (QID) | ORAL | Status: DC | PRN
  Administered 2020-02-10 – 2020-02-12 (×6): 650 mg via ORAL
  Filled 2020-02-10 (×7): qty 2

## 2020-02-10 MED ORDER — SODIUM CHLORIDE 0.9% FLUSH
3.0000 mL | Freq: Two times a day (BID) | INTRAVENOUS | Status: DC
  Administered 2020-02-10 – 2020-02-12 (×4): 3 mL via INTRAVENOUS

## 2020-02-10 MED ORDER — METOPROLOL TARTRATE 50 MG PO TABS
50.0000 mg | ORAL_TABLET | Freq: Every day | ORAL | Status: DC
  Administered 2020-02-12: 50 mg via ORAL
  Filled 2020-02-10: qty 1

## 2020-02-10 MED ORDER — ALBUTEROL SULFATE (2.5 MG/3ML) 0.083% IN NEBU: 2.5000 mg | INHALATION_SOLUTION | Freq: Four times a day (QID) | RESPIRATORY_TRACT | Status: DC | PRN

## 2020-02-10 NOTE — H&P (Addendum)
History and Physical    [name] ZOX:[id number] DOB: July 15, 1955 DOA: 02/10/2020  Referring MD/NP/PA: Isla Pence, MD PCP: [name], [place]  Patient coming from: Home  Chief Complaint: Feel bad  I have personally briefly reviewed patient's old medical records in [place]   HPI: [name] is a 64 y.o. male with medical history significant of hypertension, diabetes 2, COVID-19 in 09/2019, nephrolithiasis, BPH, and prostate cancer presents with complaints of not feeling well for couple weeks now.  He complains of feeling sick on his stomach with persistent nausea and vomiting and right lower back pain.  Notes that he is unable to keep any food or liquids down.  He complained of feeling similar to when he had been diagnosed with COVID-19 earlier this year.  Patient had been being followed by Dr. Felipa Eth of urology in regards to his back pain.  Plan was for right nephrostomy tube followed by antegrade nephrostogram and possible repeat Lasix renogram from care everywhere records from 11/10.  However patient was unable to make the follow-up appointment on the 16th due to lack of transportation.  ED Course: Upon admission into the emergency department patient was seen to be afebrile, pulse 64-102, respiration 12-28, and all other vital signs stable.  Labs significant for BUN 41 and creatinine 2.2.  Chest x-ray showed no acute disease.  Influenza and COVID-19 screening were negative.  CT renal stone study revealed no acute findings and chronic right renal atrophy with moderate to severe hydronephrosis and hydroureter.  Urinalysis was positive for moderate hemoglobin without significant signs of infection patient had been given antiemetics, 2 L of IV fluids, and morphine without improvement in symptoms.  TRH called to admit.  Review of Systems  Constitutional: Positive for malaise/fatigue. Negative for fever.  HENT: Negative for congestion and nosebleeds.   Eyes: Negative for  photophobia and pain.  Respiratory: Negative for cough and shortness of breath.   Cardiovascular: Negative for chest pain and leg swelling.  Gastrointestinal: Positive for abdominal pain, nausea and vomiting.  Genitourinary: Positive for flank pain. Negative for dysuria.  Musculoskeletal: Negative for falls and myalgias.  Skin: Negative for itching.  Neurological: Positive for weakness. Negative for speech change and focal weakness.  Endo/Heme/Allergies: Negative for environmental allergies.  Psychiatric/Behavioral: Negative for memory loss and substance abuse.    Past Medical History:  Diagnosis Date  . Closed nondisplaced spiral fracture of shaft of right tibia   . Complicated urinary tract infection 09/06/2016  . Depression   . Diabetes mellitus without complication ([place])   . GERD (gastroesophageal reflux disease)   . Gout    right foot  . Hemorrhoids   . Hypertension   . Hypertension   . Macular degeneration   . Microcytic anemia   . Prostate cancer ([place]) 08/2009  . Rectal bleeding 07/31/2016  . Tubular adenoma     Past Surgical History:  Procedure Laterality Date  . CYSTOSCOPY WITH RETROGRADE PYELOGRAM, URETEROSCOPY AND STENT PLACEMENT Right 07/31/2016   Procedure: CYSTOSCOPY WITH RIGHT  RETROGRADE PYELOGRAM, URETEROSCOPY;  Surgeon: [name], MD;  Location: [facility];  Service: Urology;  Laterality: Right;  . INGUINAL HERNIA REPAIR    . Left ankle joint fusion  1981  . PROSTATE BIOPSY     x 2  . URETERAL REIMPLANTION  07/31/2016   Procedure: URETERAL REIMPLANT, right boari flap, right psoas hitch;  Surgeon: [name], MD;  Location: [facility];  Service: Urology;;     reports that  he has never smoked. He has never used smokeless tobacco. He reports current alcohol use. He reports that he does not use drugs.  No Known Allergies  Family History  Problem Relation Age of Onset  . Lung cancer Mother        Deceased  . Throat cancer Brother   . Pancreatic  cancer Father   . Heart disease Father        Deceased  . Lung cancer Maternal Uncle        nephew  . Diabetes Other     Prior to Admission medications   Medication Sig Start Date End Date Taking? Authorizing Provider  alfuzosin (UROXATRAL) 10 MG 24 hr tablet Take 10 mg by mouth every morning. 09/27/19   [provider]  aspirin EC 81 MG tablet Take 81 mg by mouth daily.    [provider]  atorvastatin (LIPITOR) 20 MG tablet TAKE 1 TABLET BY MOUTH EVERY DAY 09/30/19   [name], [name]  guaiFENesin (MUCINEX) 600 MG 12 hr tablet Take 1 tablet (600 mg total) by mouth 2 (two) times daily. 03/25/19 03/24/20  [name], [name]  HYDROcodone-acetaminophen (NORCO) 5-325 MG tablet Take 1 tablet by mouth every 6 (six) hours as needed for moderate pain. Patient not taking: Reported on 12/13/2019 10/07/19   [name], [name]  metoprolol tartrate (LOPRESSOR) 50 MG tablet TAKE 1 TABLET BY MOUTH EVERY DAY 10/07/19   [name], [name]  MYRBETRIQ 25 MG TB24 tablet TAKE 1 TABLET BY MOUTH EVERY DAY 08/13/19   [name], [name]  nystatin (NYSTATIN) powder Apply 1 application topically 3 (three) times daily. 08/26/19   [name], [name]  olmesartan-hydrochlorothiazide (BENICAR HCT) 40-25 MG tablet Take 1 tablet by mouth daily. 10/26/19   [provider]  RYBELSUS 14 MG TABS TAKE 1 TABLET BY MOUTH DAILY. [place] 09/23/19   [name], [name]  sildenafil (REVATIO) 20 MG tablet TAKE 1 TABLET BY MOUTH EVERY DAY (PRIOR AUTHORIZATION DENIED) 01/18/20   [name], [name]  TRADJENTA 5 MG TABS tablet TAKE 1 TABLET BY MOUTH EVERY DAY 12/29/19   [name], [name]  traMADol (ULTRAM) 50 MG tablet Take 1 tablet (50 mg total) by mouth every 6 (six) hours as needed for severe pain. 10/05/19   Thurnell Lose, MD  traZODone (DESYREL) 50 MG tablet Take 1 tablet (50 mg total) by mouth at bedtime as needed for sleep. 12/13/19   [name], [name]    Physical Exam:  Constitutional:  Elderly male who appears ill Vitals:   02/10/20 1445 02/10/20 1500 02/10/20 1515 02/10/20 1545  BP: 112/69 107/61 93/60 119/82  Pulse: 69 65 68 65  Resp: 12 13 14    Temp:      TempSrc:      SpO2: 96% 95% 94% 96%  Weight:      Height:       Eyes: PERRL, lids and conjunctivae normal ENMT: Mucous membranes are dry. Posterior pharynx clear of any exudate or lesions.  Neck: normal, supple, no masses, no thyromegaly Respiratory: clear to auscultation bilaterally, no wheezing, no crackles. Normal respiratory effort. No accessory muscle use.  Cardiovascular: Regular rate and rhythm, no murmurs / rubs / gallops. No extremity edema. 2+ pedal pulses. No carotid bruits.  Abdomen: Positive right-sided CVA tenderness appreciated.  Bowel sounds present  Musculoskeletal: no clubbing / cyanosis. No joint deformity upper and lower extremities. Good ROM, no contractures. Normal muscle tone.  Skin: no rashes, lesions, ulcers. No induration Neurologic:  CN 2-12 grossly intact. Sensation intact, DTR normal. Strength 5/5 in all 4.  Psychiatric: Normal judgment and insight. Alert and oriented x 3. Normal mood.     Labs on Admission: I have personally reviewed following labs and imaging studies  CBC: Recent Labs  Lab 02/10/20 0912  WBC 7.7  NEUTROABS 6.3  HGB 17.0  HCT 52.6*  MCV 87.8  PLT 458   Basic Metabolic Panel: Recent Labs  Lab 02/10/20 0912  NA 135  K 4.1  CL 102  CO2 19*  GLUCOSE 157*  BUN 41*  CREATININE 2.20*  CALCIUM 9.1   GFR: Estimated Creatinine Clearance: 33.1 mL/min (A) (by C-G formula based on SCr of 2.2 mg/dL (H)). Liver Function Tests: Recent Labs  Lab 02/10/20 0912  AST 20  ALT 18  ALKPHOS 74  BILITOT 1.3*  PROT 6.8  ALBUMIN 3.9   No results for input(s): LIPASE, AMYLASE in the last 168 hours. No results for input(s): AMMONIA in the last 168 hours. Coagulation Profile: No results for input(s): INR, PROTIME in the last 168 hours. Cardiac Enzymes: No  results for input(s): CKTOTAL, CKMB, CKMBINDEX, TROPONINI in the last 168 hours. BNP (last 3 results) No results for input(s): PROBNP in the last 8760 hours. HbA1C: No results for input(s): HGBA1C in the last 72 hours. CBG: No results for input(s): GLUCAP in the last 168 hours. Lipid Profile: No results for input(s): CHOL, HDL, LDLCALC, TRIG, CHOLHDL, LDLDIRECT in the last 72 hours. Thyroid Function Tests: No results for input(s): TSH, T4TOTAL, FREET4, T3FREE, THYROIDAB in the last 72 hours. Anemia Panel: No results for input(s): VITAMINB12, FOLATE, FERRITIN, TIBC, IRON, RETICCTPCT in the last 72 hours. Urine analysis:    Component Value Date/Time   COLORURINE YELLOW 02/10/2020 0912   APPEARANCEUR CLEAR 02/10/2020 0912   LABSPEC 1.016 02/10/2020 0912   PHURINE 5.0 02/10/2020 0912   GLUCOSEU 50 (A) 02/10/2020 0912   HGBUR MODERATE (A) 02/10/2020 0912   BILIRUBINUR NEGATIVE 02/10/2020 0912   BILIRUBINUR negative 08/26/2019 1516   KETONESUR 5 (A) 02/10/2020 0912   PROTEINUR NEGATIVE 02/10/2020 0912   UROBILINOGEN 0.2 08/26/2019 1516   UROBILINOGEN 0.2 04/18/2008 1153   NITRITE NEGATIVE 02/10/2020 0912   LEUKOCYTESUR NEGATIVE 02/10/2020 0912   Sepsis Labs: Recent Results (from the past 240 hour(s))  Resp Panel by RT-PCR (Flu A&B, Covid) Nasopharyngeal Swab     Status: None   Collection Time: 02/10/20  9:12 AM   Specimen: Nasopharyngeal Swab; Nasopharyngeal(NP) swabs in vial transport medium  Result Value Ref Range Status   SARS Coronavirus 2 by RT PCR NEGATIVE NEGATIVE Final    Comment: (NOTE) SARS-CoV-2 target nucleic acids are NOT DETECTED.  The SARS-CoV-2 RNA is generally detectable in upper respiratory specimens during the acute phase of infection. The lowest concentration of SARS-CoV-2 viral copies this assay can detect is 138 copies/mL. A negative result does not preclude SARS-Cov-2 infection and should not be used as the sole basis for treatment or other patient  management decisions. A negative result may occur with  improper specimen collection/handling, submission of specimen other than nasopharyngeal swab, presence of viral mutation(s) within the areas targeted by this assay, and inadequate number of viral copies(<138 copies/mL). A negative result must be combined with clinical observations, patient history, and epidemiological information. The expected result is Negative.  Fact Sheet for Patients:  EntrepreneurPulse.com.au  Fact Sheet for Healthcare Providers:  IncredibleEmployment.be  This test is no t yet approved or cleared by the [place] and  has been authorized for detection and/or diagnosis of SARS-CoV-2 by FDA under an Emergency Use Authorization (EUA). This EUA will remain  in effect (meaning this test can be used) for the duration of the COVID-19 declaration under Section 564(b)(1) of the Act, 21 U.S.C.section 360bbb-3(b)(1), unless the authorization is terminated  or revoked sooner.       Influenza A by PCR NEGATIVE NEGATIVE Final   Influenza B by PCR NEGATIVE NEGATIVE Final    Comment: (NOTE) The Xpert Xpress SARS-CoV-2/FLU/RSV plus assay is intended as an aid in the diagnosis of influenza from Nasopharyngeal swab specimens and should not be used as a sole basis for treatment. Nasal washings and aspirates are unacceptable for Xpert Xpress SARS-CoV-2/FLU/RSV testing.  Fact Sheet for Patients: EntrepreneurPulse.com.au  Fact Sheet for Healthcare Providers: IncredibleEmployment.be  This test is not yet approved or cleared by the [place] FDA and has been authorized for detection and/or diagnosis of SARS-CoV-2 by FDA under an Emergency Use Authorization (EUA). This EUA will remain in effect (meaning this test can be used) for the duration of the COVID-19 declaration under Section 564(b)(1) of the Act, 21 U.S.C. section 360bbb-3(b)(1),  unless the authorization is terminated or revoked.  Performed at [facility], [place] [address], [place], [place] [address]      Radiological Exams on Admission: DG Chest Portable 1 View  Result Date: 02/10/2020 CLINICAL DATA:  Shortness of breath and weakness. EXAM: PORTABLE CHEST 1 VIEW COMPARISON:  09/28/2019 FINDINGS: 0919 hours. The lungs are clear without focal pneumonia, edema, pneumothorax or pleural effusion. Stable blunting cardiophrenic angle at the left base. Patchy basilar airspace disease noted right lung base previously has resolved. The cardiopericardial silhouette is within normal limits for size. The visualized bony structures of the thorax show no acute abnormality. Telemetry leads overlie the chest. IMPRESSION: No active disease. Electronically Signed   By: Misty Stanley M.D.   On: 02/10/2020 09:30   CT Renal Stone Study  Result Date: 02/10/2020 CLINICAL DATA:  Flank pain.  Evaluate for a kidney stone. EXAM: CT ABDOMEN AND PELVIS WITHOUT CONTRAST TECHNIQUE: Multidetector CT imaging of the abdomen and pelvis was performed following the standard protocol without IV contrast. COMPARISON:  10/02/2019 FINDINGS: Lower chest: No acute abnormality. Hepatobiliary: No focal liver abnormality is seen. No gallstones, gallbladder wall thickening, or biliary dilatation. Pancreas: Unremarkable. No pancreatic ductal dilatation or surrounding inflammatory changes. Spleen: Normal in size without focal abnormality. Adrenals/Urinary Tract: Normal adrenal glands. Bilateral kidney cysts are again noted, incompletely characterized without IV contrast. The largest arises from the inferior pole of the right kidney measuring 4.3 cm. Asymmetric right renal atrophy. Moderate to severe right hydronephrosis is similar to the previous exam. Marked hydronephrosis involving the distal ureter extending from the bladder up to the surgical anastomosis at the psoas hitch. This is unchanged from 09/22/2019. No  right ureteral calculi. A small stone is noted within the inferior pole collecting system of the right kidney measuring 3 mm, image 43/3. No left renal calculi, left hydronephrosis, or left ureteral calculi. No stones identified within the urinary bladder. Stomach/Bowel: Stomach is within normal limits. Appendix appears normal. No evidence of bowel wall thickening, distention, or inflammatory changes. Vascular/Lymphatic: Aortic atherosclerosis. No aneurysm. No abdominopelvic adenopathy. Reproductive: Prostate gland enlargement. Other: No free fluid or fluid collections. Musculoskeletal: No acute or significant osseous findings. IMPRESSION: 1. No acute findings identified within the abdomen or pelvis. No obstructing calculi identified. 2. Chronic right renal atrophy with moderate to severe hydronephrosis and  hydroureter. Severe right hydroureter is noted distally from the bladder up to the surgical anastomosis at the psoas hitch. Unchanged from previous exam. 3. Nonobstructing inferior pole right kidney stone. 4. Aortic atherosclerosis. 5. Prostate gland enlargement. Aortic Atherosclerosis (ICD10-I70.0). Electronically Signed   By: Kerby Moors M.D.   On: 02/10/2020 10:12    EKG: Independently reviewed.  Sinus rhythm at 97 bpm  Assessment/Plan Acute kidney injury superimposed on chronic kidney disease stage III: Patient presents with creatinine 2.2 with BUN 41.  At baseline patient creatinine around 1.4-1.6.  The elevated BUN to creatinine ratio suggest a prerenal cause of symptoms. -Admit to a medical telemetry bed -Strict intake and output -Avoid nephrotoxic agents -Normal saline IV fluids at 75 mL/hour  Right-sided hydroureteronephrosis: Chronic.  Imaging studies showed chronic right renal atrophy with moderate to severe hydronephrosis and hydroureter.  Discussed case with Dr. Alyson Ingles urology on-call who recommended placing IR consult. -IR consult placed for right nephrostomy tube placed -N.p.o.  after midnight  Nausea and vomiting: Acute.  Patient reports having persistent nausea and vomiting to the point where it is unable to keep any significant food or liquids down.  Question if patient symptoms could be related to gastroparesis given his long history of uncontrolled diabetes. -Scopolamine patch  -Zofran IV as needed -Advance diet as tolerated -May warrant further work-up including gastric emptying study and/or addition Reglan  Diabetes mellitus type 2, uncontrolled: Patient glucose is mildly elevated 157.  Last hemoglobin A1c noted to be 7.7 on 12/13/2019.  Home medications include Rybelsus 40 mg every morning and Tradjenta 5 mg daily with breakfast. -Hypoglycemic protocols -Hold Rybelsus 40 mg -CBGs before every meal with sensitive SSI  Essential hypertension: Home blood pressure medications include metoprolol tartrate 50 mg daily and olmesartan-hydrochlorothiazide 40-25 mg daily. -Hold olmesartan-hydrochlorothiazide due to acute kidney injury -Continue metoprolol  History of COVID-19: Diagnosed back in July 2021  Prostate cancer: Patient previously diagnosed with prostate cancer back in 2011.  Review of records shows intermittent compliance with medication regimens and issues with follow-up in the past. -Continue outpatient follow-up with Dr. Felipa Eth  DVT prophylaxis: Heparin Code Status: Full Family Communication: No family updated at this time Disposition Plan: Hopefully discharge home once medically stable Consults called: Case discussed over the phone with Dr. Alyson Ingles of urology Admission status: Observation  Norval Morton MD [facility] Pager [phone]   If 7PM-7AM, please contact night-coverage [email or web address] Password [facility]  02/10/2020, 3:54 PM

## 2020-02-10 NOTE — ED Triage Notes (Signed)
BIB [facility] w/ complaints of feeling weak and tired over the last 2 days. Pt reports not being able to hold any food/ liquids down when eating, feeling nauseated and has had intermittent vomiting. Pt complaining of R sided lower back pain. V/S per EMS 158/76- BP HR-110 Resp-18 SpO2- 97% CBG- 141. Temp- 99.

## 2020-02-10 NOTE — ED Notes (Signed)
Patient transported to CT 

## 2020-02-10 NOTE — ED Notes (Signed)
Dinner Trays Ordered @ [phone].

## 2020-02-10 NOTE — ED Provider Notes (Signed)
[facility] EMERGENCY DEPARTMENT Provider Note   CSN: [id number] Arrival date & time: 02/10/20  2956     History Chief Complaint  Patient presents with  . Weakness    [name] is a 64 y.o. male.  Pt presents to the ED today with n/v and weakness.  Pt said he has not felt well for 2 days.  He has right lower back pain.  He has been unable to keep anything down.  Pt denies any sob or cough.  He feels like he did when he had Covid in July.  No f/c.        Past Medical History:  Diagnosis Date  . Closed nondisplaced spiral fracture of shaft of right tibia   . Complicated urinary tract infection 09/06/2016  . Depression   . Diabetes mellitus without complication ([place])   . GERD (gastroesophageal reflux disease)   . Gout    right foot  . Hemorrhoids   . Hypertension   . Hypertension   . Macular degeneration   . Microcytic anemia   . Prostate cancer ([place]) 08/2009  . Rectal bleeding 07/31/2016  . Tubular adenoma     Patient Active Problem List   Diagnosis Date Noted  . Pneumonia due to COVID-19 virus 09/29/2019  . Acute on chronic kidney failure ([place]) 09/28/2019  . Hyponatremia 09/28/2019  . Sinus tachycardia 09/28/2019  . Insomnia 08/26/2018  . Nephropathy 03/26/2018  . Urinary urgency 03/26/2018  . Type 2 diabetes mellitus with diabetic nephropathy, without long-term current use of insulin ([place]) 03/26/2018  . Essential hypertension 03/26/2018  . Chronic kidney disease (CKD), stage III (moderate) ([place]) 03/26/2018  . Conductive hearing loss, bilateral 11/07/2017  . Uncontrolled type 2 diabetes mellitus with hyperglycemia ([place]) 07/31/2016  . Dyslipidemia associated with type 2 diabetes mellitus ([place]) 05/29/2016  . Microcytic anemia 02/17/2012  . 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 Benign hypertension 02/17/2012  . Depressive disorder 02/17/2012    Past Surgical History:  Procedure Laterality Date  . CYSTOSCOPY WITH RETROGRADE PYELOGRAM,  URETEROSCOPY AND STENT PLACEMENT Right 07/31/2016   Procedure: CYSTOSCOPY WITH RIGHT  RETROGRADE PYELOGRAM, URETEROSCOPY;  Surgeon: [name], MD;  Location: [facility];  Service: Urology;  Laterality: Right;  . INGUINAL HERNIA REPAIR    . Left ankle joint fusion  1981  . PROSTATE BIOPSY     x 2  . URETERAL REIMPLANTION  07/31/2016   Procedure: URETERAL REIMPLANT, right boari flap, right psoas hitch;  Surgeon: [name], MD;  Location: [facility];  Service: Urology;;       Family History  Problem Relation Age of Onset  . Lung cancer Mother        Deceased  . Throat cancer Brother   . Pancreatic cancer Father   . Heart disease Father        Deceased  . Lung cancer Maternal Uncle        nephew  . Diabetes Other     Social History   Tobacco Use  . Smoking status: Never Smoker  . Smokeless tobacco: Never Used  Vaping Use  . Vaping Use: Never used  Substance Use Topics  . Alcohol use: Yes    Comment: ocassionally  . Drug use: No    Home Medications Prior to Admission medications   Medication Sig Start Date End Date Taking? Authorizing Provider  alfuzosin (UROXATRAL) 10 MG 24 hr tablet Take 10 mg by mouth daily with breakfast.  09/27/19  Yes [provider]  aspirin EC 81 MG tablet Take 81 mg by mouth daily with breakfast.    Yes [provider]  atorvastatin (LIPITOR) 20 MG tablet TAKE 1 TABLET BY MOUTH EVERY DAY Patient taking differently: Take 20 mg by mouth daily with breakfast.  09/30/19  Yes [name], [name]  guaiFENesin (MUCINEX) 600 MG 12 hr tablet Take 1 tablet (600 mg total) by mouth 2 (two) times daily. 03/25/19 03/24/20 Yes [name], [name]  metoprolol tartrate (LOPRESSOR) 50 MG tablet TAKE 1 TABLET BY MOUTH EVERY DAY Patient taking differently: Take 50 mg by mouth daily with breakfast. (BETA BLOCKER) 10/07/19  Yes [name], [name]  Multiple Vitamin (MULTIVITAMIN WITH MINERALS) TABS tablet Take 1 tablet by mouth daily with breakfast.    Yes [provider]  MYRBETRIQ 25 MG TB24 tablet TAKE 1 TABLET BY MOUTH EVERY DAY Patient taking differently: Take 25 mg by mouth daily with breakfast.  08/13/19  Yes [name], [name]  nystatin (NYSTATIN) powder Apply 1 application topically 3 (three) times daily. Patient taking differently: Apply 1 application topically 2 (two) times daily as needed (itching/rash).  08/26/19  Yes [name], [name]  olmesartan-hydrochlorothiazide (BENICAR HCT) 40-25 MG tablet Take 1 tablet by mouth daily with breakfast.  10/26/19  Yes [provider]  RYBELSUS 14 MG TABS TAKE 1 TABLET BY MOUTH DAILY. [place] Patient taking differently: Take 14 mg by mouth daily before breakfast. 30 minutes before breakfast 09/23/19  Yes [name], [name]  sildenafil (REVATIO) 20 MG tablet TAKE 1 TABLET BY MOUTH EVERY DAY (PRIOR AUTHORIZATION DENIED) Patient taking differently: Take 20 mg by mouth daily as needed (erectile dysfunction).  01/18/20  Yes [name], [name]  Tetrahydrozoline HCl (VISINE OP) Place 1 drop into both eyes daily as needed (itching/irritation).   Yes [provider]  TRADJENTA 5 MG TABS tablet TAKE 1 TABLET BY MOUTH EVERY DAY Patient taking differently: Take 5 mg by mouth daily with breakfast.  12/29/19  Yes [name], [name]  traMADol (ULTRAM) 50 MG tablet Take 1 tablet (50 mg total) by mouth every 6 (six) hours as needed for severe pain. 10/05/19  Yes Thurnell Lose, MD  traZODone (DESYREL) 50 MG tablet Take 1 tablet (50 mg total) by mouth at bedtime as needed for sleep. 12/13/19  Yes [name], [name]  HYDROcodone-acetaminophen (NORCO) 5-325 MG tablet Take 1 tablet by mouth every 6 (six) hours as needed for moderate pain. Patient not taking: Reported on 12/13/2019 10/07/19   [name], [place]    Allergies    Patient has no known allergies.  Review of Systems   Review of Systems  Constitutional: Positive for fatigue.  Gastrointestinal: Positive for  nausea and vomiting.  Genitourinary: Positive for flank pain.  All other systems reviewed and are negative.   Physical Exam Updated Vital Signs BP 119/82   Pulse 65   Temp 98.1 F (36.7 C) (Oral)   Resp 14   Ht 5\' 6"  (1.676 m)   Wt 81.6 kg   SpO2 96%   BMI 29.05 kg/m   Physical Exam Vitals and nursing note reviewed.  Constitutional:      Appearance: Normal appearance.  HENT:     Head: Normocephalic and atraumatic.     Right Ear: External ear normal.     Left Ear: External ear normal.     Nose: Nose normal.     Mouth/Throat:     Mouth: Mucous membranes are dry.  Eyes:  Extraocular Movements: Extraocular movements intact.     Conjunctiva/sclera: Conjunctivae normal.     Pupils: Pupils are equal, round, and reactive to light.  Cardiovascular:     Rate and Rhythm: Regular rhythm. Tachycardia present.     Pulses: Normal pulses.     Heart sounds: Normal heart sounds.  Pulmonary:     Effort: Pulmonary effort is normal.     Breath sounds: Normal breath sounds.  Abdominal:     General: Abdomen is flat. Bowel sounds are normal.     Palpations: Abdomen is soft.  Musculoskeletal:        General: Normal range of motion.     Cervical back: Normal range of motion and neck supple.  Skin:    General: Skin is warm.     Capillary Refill: Capillary refill takes less than 2 seconds.  Neurological:     General: No focal deficit present.     Mental Status: He is alert and oriented to person, place, and time.  Psychiatric:        Mood and Affect: Mood normal.        Behavior: Behavior normal.        Thought Content: Thought content normal.        Judgment: Judgment normal.     ED Results / Procedures / Treatments   Labs (all labs ordered are listed, but only abnormal results are displayed) Labs Reviewed  COMPREHENSIVE METABOLIC PANEL - Abnormal; Notable for the following components:      Result Value   CO2 19 (*)    Glucose, Bld 157 (*)    BUN 41 (*)    Creatinine, Ser  2.20 (*)    Total Bilirubin 1.3 (*)    GFR, Estimated 32 (*)    All other components within normal limits  CBC WITH DIFFERENTIAL/PLATELET - Abnormal; Notable for the following components:   RBC 5.99 (*)    HCT 52.6 (*)    All other components within normal limits  URINALYSIS, ROUTINE W REFLEX MICROSCOPIC - Abnormal; Notable for the following components:   Glucose, UA 50 (*)    Hgb urine dipstick MODERATE (*)    Ketones, ur 5 (*)    All other components within normal limits  RESP PANEL BY RT-PCR (FLU A&B, COVID) [id number]    EKG EKG Interpretation  Date/Time:  Thursday February 10 2020 09:05:18 EST Ventricular Rate:  98 PR Interval:    QRS Duration: 83 QT Interval:  337 QTC Calculation: 431 R Axis:   -28 Text Interpretation: Sinus rhythm Consider left atrial enlargement Inferior infarct, old Consider anterior infarct No significant change since last tracing Confirmed by Isla Pence [phone]) on 02/10/2020 9:28:59 AM   Radiology DG Chest Portable 1 View  Result Date: 02/10/2020 CLINICAL DATA:  Shortness of breath and weakness. EXAM: PORTABLE CHEST 1 VIEW COMPARISON:  09/28/2019 FINDINGS: 0919 hours. The lungs are clear without focal pneumonia, edema, pneumothorax or pleural effusion. Stable blunting cardiophrenic angle at the left base. Patchy basilar airspace disease noted right lung base previously has resolved. The cardiopericardial silhouette is within normal limits for size. The visualized bony structures of the thorax show no acute abnormality. Telemetry leads overlie the chest. IMPRESSION: No active disease. Electronically Signed   By: Misty Stanley M.D.   On: 02/10/2020 09:30   CT Renal Stone Study  Result Date: 02/10/2020 CLINICAL DATA:  Flank pain.  Evaluate for a kidney stone. EXAM: CT ABDOMEN AND PELVIS WITHOUT CONTRAST TECHNIQUE: Multidetector CT imaging of  the abdomen and pelvis was performed following the standard protocol without IV contrast. COMPARISON:  10/02/2019  FINDINGS: Lower chest: No acute abnormality. Hepatobiliary: No focal liver abnormality is seen. No gallstones, gallbladder wall thickening, or biliary dilatation. Pancreas: Unremarkable. No pancreatic ductal dilatation or surrounding inflammatory changes. Spleen: Normal in size without focal abnormality. Adrenals/Urinary Tract: Normal adrenal glands. Bilateral kidney cysts are again noted, incompletely characterized without IV contrast. The largest arises from the inferior pole of the right kidney measuring 4.3 cm. Asymmetric right renal atrophy. Moderate to severe right hydronephrosis is similar to the previous exam. Marked hydronephrosis involving the distal ureter extending from the bladder up to the surgical anastomosis at the psoas hitch. This is unchanged from 09/22/2019. No right ureteral calculi. A small stone is noted within the inferior pole collecting system of the right kidney measuring 3 mm, image 43/3. No left renal calculi, left hydronephrosis, or left ureteral calculi. No stones identified within the urinary bladder. Stomach/Bowel: Stomach is within normal limits. Appendix appears normal. No evidence of bowel wall thickening, distention, or inflammatory changes. Vascular/Lymphatic: Aortic atherosclerosis. No aneurysm. No abdominopelvic adenopathy. Reproductive: Prostate gland enlargement. Other: No free fluid or fluid collections. Musculoskeletal: No acute or significant osseous findings. IMPRESSION: 1. No acute findings identified within the abdomen or pelvis. No obstructing calculi identified. 2. Chronic right renal atrophy with moderate to severe hydronephrosis and hydroureter. Severe right hydroureter is noted distally from the bladder up to the surgical anastomosis at the psoas hitch. Unchanged from previous exam. 3. Nonobstructing inferior pole right kidney stone. 4. Aortic atherosclerosis. 5. Prostate gland enlargement. Aortic Atherosclerosis (ICD10-I70.0). Electronically Signed   By: Kerby Moors M.D.   On: 02/10/2020 10:12    Procedures Procedures (including critical care time)  Medications Ordered in ED Medications  sodium chloride 0.9 % bolus 1,000 mL (has no administration in time range)  sodium chloride 0.9 % bolus 1,000 mL (1,000 mLs Intravenous New Bag/Given 02/10/20 0927)  ondansetron (ZOFRAN) injection 4 mg (4 mg Intravenous Given 02/10/20 0924)  promethazine (PHENERGAN) injection 12.5 mg (12.5 mg Intravenous Given 02/10/20 1013)  morphine 4 MG/ML injection 4 mg (4 mg Intravenous Given 02/10/20 1200)  droperidol (INAPSINE) 2.5 MG/ML injection 1.25 mg (1.25 mg Intravenous Given 02/10/20 1355)    ED Course  I have reviewed the triage vital signs and the nursing notes.  Pertinent labs & imaging results that were available during my care of the patient were reviewed by me and considered in my medical decision making (see chart for details).    MDM Rules/Calculators/A&P                          Pt has been given zofran, phenergan, and inapsine.  He stills feels nauseous and "bad."  He is unable to tolerate po fluids. His IV is in his AC fossa and he keeps bending his arm, so fluids are going slowly.  However, his bp is worsening instead of improving.  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  He saw Dr. Felipa Eth (urology) on 11/10.  He planned to do an IR tube placement and a right nephrostomy on 11/16.  However, this was cancelled due to lack of transportation.  Pt d/w Dr. Tamala Julian ([facility]) for admission. Final Clinical Impression(s) / ED Diagnoses Final diagnoses:  Dehydration  Intractable nausea and vomiting  Right flank pain    Rx / DC Orders ED Discharge Orders  None       Isla Pence, MD 02/10/20 1600

## 2020-02-11 ENCOUNTER — Observation Stay (HOSPITAL_COMMUNITY): Payer: Medicare Other

## 2020-02-11 DIAGNOSIS — N1831 Chronic kidney disease, stage 3a: Secondary | ICD-10-CM | POA: Diagnosis present

## 2020-02-11 DIAGNOSIS — H3530 Unspecified macular degeneration: Secondary | ICD-10-CM | POA: Diagnosis present

## 2020-02-11 DIAGNOSIS — Z833 Family history of diabetes mellitus: Secondary | ICD-10-CM | POA: Diagnosis not present

## 2020-02-11 DIAGNOSIS — Z801 Family history of malignant neoplasm of trachea, bronchus and lung: Secondary | ICD-10-CM | POA: Diagnosis not present

## 2020-02-11 DIAGNOSIS — Z808 Family history of malignant neoplasm of other organs or systems: Secondary | ICD-10-CM | POA: Diagnosis not present

## 2020-02-11 DIAGNOSIS — N179 Acute kidney failure, unspecified: Secondary | ICD-10-CM | POA: Diagnosis not present

## 2020-02-11 DIAGNOSIS — Z7984 Long term (current) use of oral hypoglycemic drugs: Secondary | ICD-10-CM | POA: Diagnosis not present

## 2020-02-11 DIAGNOSIS — Z20822 Contact with and (suspected) exposure to covid-19: Secondary | ICD-10-CM | POA: Diagnosis present

## 2020-02-11 DIAGNOSIS — E860 Dehydration: Secondary | ICD-10-CM | POA: Diagnosis present

## 2020-02-11 DIAGNOSIS — Z79899 Other long term (current) drug therapy: Secondary | ICD-10-CM | POA: Diagnosis not present

## 2020-02-11 DIAGNOSIS — Z9119 Patient's noncompliance with other medical treatment and regimen: Secondary | ICD-10-CM | POA: Diagnosis not present

## 2020-02-11 DIAGNOSIS — Z8616 Personal history of COVID-19: Secondary | ICD-10-CM | POA: Diagnosis not present

## 2020-02-11 DIAGNOSIS — M109 Gout, unspecified: Secondary | ICD-10-CM | POA: Diagnosis present

## 2020-02-11 DIAGNOSIS — C61 Malignant neoplasm of prostate: Secondary | ICD-10-CM | POA: Diagnosis present

## 2020-02-11 DIAGNOSIS — K219 Gastro-esophageal reflux disease without esophagitis: Secondary | ICD-10-CM | POA: Diagnosis present

## 2020-02-11 DIAGNOSIS — Z8249 Family history of ischemic heart disease and other diseases of the circulatory system: Secondary | ICD-10-CM | POA: Diagnosis not present

## 2020-02-11 DIAGNOSIS — Z800 Family history of malignant neoplasm of digestive organs: Secondary | ICD-10-CM | POA: Diagnosis not present

## 2020-02-11 DIAGNOSIS — I129 Hypertensive chronic kidney disease with stage 1 through stage 4 chronic kidney disease, or unspecified chronic kidney disease: Secondary | ICD-10-CM | POA: Diagnosis present

## 2020-02-11 DIAGNOSIS — E1122 Type 2 diabetes mellitus with diabetic chronic kidney disease: Secondary | ICD-10-CM | POA: Diagnosis present

## 2020-02-11 DIAGNOSIS — Z7982 Long term (current) use of aspirin: Secondary | ICD-10-CM | POA: Diagnosis not present

## 2020-02-11 DIAGNOSIS — N189 Chronic kidney disease, unspecified: Secondary | ICD-10-CM | POA: Diagnosis not present

## 2020-02-11 DIAGNOSIS — N132 Hydronephrosis with renal and ureteral calculous obstruction: Secondary | ICD-10-CM | POA: Diagnosis not present

## 2020-02-11 LAB — CBC
HCT: 49.5 % (ref 39.0–52.0)
Hemoglobin: 16.1 g/dL (ref 13.0–17.0)
MCH: 28.5 pg (ref 26.0–34.0)
MCHC: 32.5 g/dL (ref 30.0–36.0)
MCV: 87.6 fL (ref 80.0–100.0)
Platelets: 169 10*3/uL (ref 150–400)
RBC: 5.65 MIL/uL (ref 4.22–5.81)
RDW: 13.2 % (ref 11.5–15.5)
WBC: 7 10*3/uL (ref 4.0–10.5)
nRBC: 0 % (ref 0.0–0.2)

## 2020-02-11 LAB — GLUCOSE, CAPILLARY
Glucose-Capillary: 106 mg/dL — ABNORMAL HIGH (ref 70–99)
Glucose-Capillary: 97 mg/dL (ref 70–99)
Glucose-Capillary: 128 mg/dL — ABNORMAL HIGH (ref 70–99)
Glucose-Capillary: 178 mg/dL — ABNORMAL HIGH (ref 70–99)

## 2020-02-11 LAB — BASIC METABOLIC PANEL
Anion gap: 10 (ref 5–15)
BUN: 36 mg/dL — ABNORMAL HIGH (ref 8–23)
CO2: 20 mmol/L — ABNORMAL LOW (ref 22–32)
Calcium: 8.9 mg/dL (ref 8.9–10.3)
Chloride: 107 mmol/L (ref 98–111)
Creatinine, Ser: 1.75 mg/dL — ABNORMAL HIGH (ref 0.61–1.24)
GFR, Estimated: 42 mL/min — ABNORMAL LOW (ref >60)
Glucose, Bld: 90 mg/dL (ref 70–99)
Potassium: 4.1 mmol/L (ref 3.5–5.1)
Sodium: 137 mmol/L (ref 135–145)

## 2020-02-11 MED ORDER — SODIUM CHLORIDE 0.9 % IV SOLN: INTRAVENOUS | Status: AC

## 2020-02-11 MED ORDER — HYDROMORPHONE HCL 1 MG/ML IJ SOLN
1.0000 mg | INTRAMUSCULAR | Status: DC | PRN
  Administered 2020-02-11 (×2): 1 mg via INTRAVENOUS
  Filled 2020-02-11 (×3): qty 1

## 2020-02-11 MED ORDER — ADULT MULTIVITAMIN W/MINERALS CH
1.0000 | ORAL_TABLET | Freq: Every day | ORAL | Status: DC
  Administered 2020-02-11 – 2020-02-12 (×2): 1 via ORAL
  Filled 2020-02-11 (×2): qty 1

## 2020-02-11 MED ORDER — SODIUM CHLORIDE 0.9 % IV BOLUS: 500.0000 mL | Freq: Once | INTRAVENOUS | Status: DC

## 2020-02-11 MED ORDER — LIDOCAINE-EPINEPHRINE 1 %-1:100000 IJ SOLN
INTRAMUSCULAR | Status: AC
  Filled 2020-02-11: qty 1

## 2020-02-11 MED ORDER — ENSURE ENLIVE PO LIQD
237.0000 mL | Freq: Three times a day (TID) | ORAL | Status: DC
  Administered 2020-02-11 – 2020-02-12 (×3): 237 mL via ORAL

## 2020-02-11 NOTE — Progress Notes (Signed)
PROGRESS NOTE    [name]  WEX:[id number] DOB: 1955/09/11 DOA: 02/10/2020 PCP: [name], [name]  Brief Narrative: 64 year old male with history of prostate cancer, prior nephrolithiasis and chronic right hydronephrosis followed by urologist in [place], obesity, macular degeneration, type 2 diabetes mellitus was being followed by Dr. Felipa Eth at [facility] urology with regards to his hydronephrosis and prostate cancer, patient was due to have a right nephrostomy drain placed on the 16th however he missed his appointment reportedly due to transportation issues. -Presented to the ER yesterday with fatigue, nausea, chronic right flank and abdominal pain and an episode of vomiting. -In the ED work-up was notable for worsening of creatinine up to 2.2 from baseline, CT renal protocol revealed no acute findings, chronic renal atrophy with moderate to severe right hydronephrosis and hydroureter which is unchanged from prior, urinalysis without signs of infection   Assessment & Plan:    Acute kidney injury on chronic kidney disease stage IIIa Chronic right hydronephrosis -Baseline creatinine around 1.6, creatinine was 2.2 on admission likely multifactorial from previous renal losses and chronic hydronephrosis -Improving with hydration continue normal saline today, holding ARB -Case was discussed with Dr. Alyson Ingles urology on-call yesterday who recommended IR consult for right nephrostomy drain placement, this was due to be completed 10 days ago [facility] which he unfortunately did not follow-up for. -IR consulted  -Will need follow-up back with his urologist Dr. Felipa Eth in few weeks  Nausea and vomiting  -Improving -No signs of infection at this time, supportive care, monitor   Type 2 diabetes mellitus -Last hemoglobin A1c was 7.7 -Oral hypoglycemics on hold, continue sliding scale insulin  Essential hypertension -Continue metoprolol, holding ARB in the setting of AKI  Prostate  cancer -Follow-up with urology Dr. Felipa Eth  DVT prophylaxis: Hold heparin subcutaneous today Code Status: Full code Family Communication: Discussed patient in detail, no family at bedside Disposition Plan:  Status is: Observation  The patient will require care spanning > 2 midnights and should be moved to inpatient because: Inpatient level of care appropriate due to severity of illness  Dispo: The patient is from: Home              Anticipated d/c is to: SNF              Anticipated d/c date is: 2 days              Patient currently is not medically stable to d/c.  Consultants:   Discussed with urology Dr. Louis Meckel  IR   Procedures:   Antimicrobials:    Subjective: -Complains of feeling weak, reports lack of energy, has chronic right flank pain  Objective: Vitals:   02/10/20 1822 02/10/20 2230 02/11/20 0506 02/11/20 0957  BP: (!) 141/91 135/81 112/69 125/76  Pulse: 78 72 70 71  Resp: 18 18 18 18   Temp: 97.7 F (36.5 C) 98.1 F (36.7 C) 98.4 F (36.9 C) 98.1 F (36.7 C)  TempSrc: Oral Oral Oral   SpO2: 100% 100% 100% 98%  Weight: 79.7 kg 79.7 kg    Height: 5\' 6"  (1.676 m)       Intake/Output Summary (Last 24 hours) at 02/11/2020 1100 Last data filed at 02/11/2020 1044 Gross per 24 hour  Intake 1691.23 ml  Output 300 ml  Net 1391.23 ml   Filed Weights   02/10/20 0909 02/10/20 1822 02/10/20 2230  Weight: 81.6 kg 79.7 kg 79.7 kg    Examination:  General exam: Chronically ill male sitting up in  bed, awake alert oriented x3, cognitive deficits and decreased patient CVS: S1-S2, regular rate rhythm Lungs: Decreased breath sounds in the right Abdomen: Soft, right flank tenderness, abdomen is obese, soft, mildly distended , GU: Mild erythema to scrotum Extremities: No edema neuro: Moves all extremities, no localizing signs Psych: Poor insight and judgment   Data Reviewed:   CBC: Recent Labs  Lab 02/10/20 0912 02/11/20 0133  WBC 7.7 7.0  NEUTROABS  6.3  --   HGB 17.0 16.1  HCT 52.6* 49.5  MCV 87.8 87.6  PLT 171 283   Basic Metabolic Panel: Recent Labs  Lab 02/10/20 0912 02/11/20 0133  NA 135 137  K 4.1 4.1  CL 102 107  CO2 19* 20*  GLUCOSE 157* 90  BUN 41* 36*  CREATININE 2.20* 1.75*  CALCIUM 9.1 8.9   GFR: Estimated Creatinine Clearance: 41.2 mL/min (A) (by C-G formula based on SCr of 1.75 mg/dL (H)). Liver Function Tests: Recent Labs  Lab 02/10/20 0912  AST 20  ALT 18  ALKPHOS 74  BILITOT 1.3*  PROT 6.8  ALBUMIN 3.9   No results for input(s): LIPASE, AMYLASE in the last 168 hours. No results for input(s): AMMONIA in the last 168 hours. Coagulation Profile: No results for input(s): INR, PROTIME in the last 168 hours. Cardiac Enzymes: No results for input(s): CKTOTAL, CKMB, CKMBINDEX, TROPONINI in the last 168 hours. BNP (last 3 results) No results for input(s): PROBNP in the last 8760 hours. HbA1C: No results for input(s): HGBA1C in the last 72 hours. CBG: Recent Labs  Lab 02/10/20 2106 02/11/20 0644  GLUCAP 112* 106*   Lipid Profile: No results for input(s): CHOL, HDL, LDLCALC, TRIG, CHOLHDL, LDLDIRECT in the last 72 hours. Thyroid Function Tests: No results for input(s): TSH, T4TOTAL, FREET4, T3FREE, THYROIDAB in the last 72 hours. Anemia Panel: No results for input(s): VITAMINB12, FOLATE, FERRITIN, TIBC, IRON, RETICCTPCT in the last 72 hours. Urine analysis:    Component Value Date/Time   COLORURINE YELLOW 02/10/2020 0912   APPEARANCEUR CLEAR 02/10/2020 0912   LABSPEC 1.016 02/10/2020 0912   PHURINE 5.0 02/10/2020 0912   GLUCOSEU 50 (A) 02/10/2020 0912   HGBUR MODERATE (A) 02/10/2020 0912   BILIRUBINUR NEGATIVE 02/10/2020 0912   BILIRUBINUR negative 08/26/2019 1516   KETONESUR 5 (A) 02/10/2020 0912   PROTEINUR NEGATIVE 02/10/2020 0912   UROBILINOGEN 0.2 08/26/2019 1516   UROBILINOGEN 0.2 04/18/2008 1153   NITRITE NEGATIVE 02/10/2020 0912   LEUKOCYTESUR NEGATIVE 02/10/2020 0912    Sepsis Labs: @LABRCNTIP (procalcitonin:4,lacticidven:4)  ) Recent Results (from the past 240 hour(s))  Resp Panel by RT-PCR (Flu A&B, Covid) Nasopharyngeal Swab     Status: None   Collection Time: 02/10/20  9:12 AM   Specimen: Nasopharyngeal Swab; Nasopharyngeal(NP) swabs in vial transport medium  Result Value Ref Range Status   SARS Coronavirus 2 by RT PCR NEGATIVE NEGATIVE Final    Comment: (NOTE) SARS-CoV-2 target nucleic acids are NOT DETECTED.  The SARS-CoV-2 RNA is generally detectable in upper respiratory specimens during the acute phase of infection. The lowest concentration of SARS-CoV-2 viral copies this assay can detect is 138 copies/mL. A negative result does not preclude SARS-Cov-2 infection and should not be used as the sole basis for treatment or other patient management decisions. A negative result may occur with  improper specimen collection/handling, submission of specimen other than nasopharyngeal swab, presence of viral mutation(s) within the areas targeted by this assay, and inadequate number of viral copies(<138 copies/mL). A negative result must be combined with  clinical observations, patient history, and epidemiological information. The expected result is Negative.  Fact Sheet for Patients:  EntrepreneurPulse.com.au  Fact Sheet for Healthcare Providers:  IncredibleEmployment.be  This test is no t yet approved or cleared by the [place] FDA and  has been authorized for detection and/or diagnosis of SARS-CoV-2 by FDA under an Emergency Use Authorization (EUA). This EUA will remain  in effect (meaning this test can be used) for the duration of the COVID-19 declaration under Section 564(b)(1) of the Act, 21 U.S.C.section 360bbb-3(b)(1), unless the authorization is terminated  or revoked sooner.       Influenza A by PCR NEGATIVE NEGATIVE Final   Influenza B by PCR NEGATIVE NEGATIVE Final    Comment: (NOTE) The  Xpert Xpress SARS-CoV-2/FLU/RSV plus assay is intended as an aid in the diagnosis of influenza from Nasopharyngeal swab specimens and should not be used as a sole basis for treatment. Nasal washings and aspirates are unacceptable for Xpert Xpress SARS-CoV-2/FLU/RSV testing.  Fact Sheet for Patients: EntrepreneurPulse.com.au  Fact Sheet for Healthcare Providers: IncredibleEmployment.be  This test is not yet approved or cleared by the [place] FDA and has been authorized for detection and/or diagnosis of SARS-CoV-2 by FDA under an Emergency Use Authorization (EUA). This EUA will remain in effect (meaning this test can be used) for the duration of the COVID-19 declaration under Section 564(b)(1) of the Act, 21 U.S.C. section 360bbb-3(b)(1), unless the authorization is terminated or revoked.  Performed at [facility], [place] [address] [place], [place] [address]     Radiology Studies: DG Chest Portable 1 View  Result Date: 02/10/2020 CLINICAL DATA:  Shortness of breath and weakness. EXAM: PORTABLE CHEST 1 VIEW COMPARISON:  09/28/2019 FINDINGS: 0919 hours. The lungs are clear without focal pneumonia, edema, pneumothorax or pleural effusion. Stable blunting cardiophrenic angle at the left base. Patchy basilar airspace disease noted right lung base previously has resolved. The cardiopericardial silhouette is within normal limits for size. The visualized bony structures of the thorax show no acute abnormality. Telemetry leads overlie the chest. IMPRESSION: No active disease. Electronically Signed   By: Misty Stanley M.D.   On: 02/10/2020 09:30   CT Renal Stone Study  Result Date: 02/10/2020 CLINICAL DATA:  Flank pain.  Evaluate for a kidney stone. EXAM: CT ABDOMEN AND PELVIS WITHOUT CONTRAST TECHNIQUE: Multidetector CT imaging of the abdomen and pelvis was performed following the standard protocol without IV contrast. COMPARISON:  10/02/2019  FINDINGS: Lower chest: No acute abnormality. Hepatobiliary: No focal liver abnormality is seen. No gallstones, gallbladder wall thickening, or biliary dilatation. Pancreas: Unremarkable. No pancreatic ductal dilatation or surrounding inflammatory changes. Spleen: Normal in size without focal abnormality. Adrenals/Urinary Tract: Normal adrenal glands. Bilateral kidney cysts are again noted, incompletely characterized without IV contrast. The largest arises from the inferior pole of the right kidney measuring 4.3 cm. Asymmetric right renal atrophy. Moderate to severe right hydronephrosis is similar to the previous exam. Marked hydronephrosis involving the distal ureter extending from the bladder up to the surgical anastomosis at the psoas hitch. This is unchanged from 09/22/2019. No right ureteral calculi. A small stone is noted within the inferior pole collecting system of the right kidney measuring 3 mm, image 43/3. No left renal calculi, left hydronephrosis, or left ureteral calculi. No stones identified within the urinary bladder. Stomach/Bowel: Stomach is within normal limits. Appendix appears normal. No evidence of bowel wall thickening, distention, or inflammatory changes. Vascular/Lymphatic: Aortic atherosclerosis. No aneurysm. No abdominopelvic adenopathy. Reproductive: Prostate gland  enlargement. Other: No free fluid or fluid collections. Musculoskeletal: No acute or significant osseous findings. IMPRESSION: 1. No acute findings identified within the abdomen or pelvis. No obstructing calculi identified. 2. Chronic right renal atrophy with moderate to severe hydronephrosis and hydroureter. Severe right hydroureter is noted distally from the bladder up to the surgical anastomosis at the psoas hitch. Unchanged from previous exam. 3. Nonobstructing inferior pole right kidney stone. 4. Aortic atherosclerosis. 5. Prostate gland enlargement. Aortic Atherosclerosis (ICD10-I70.0). Electronically Signed   By: Kerby Moors M.D.   On: 02/10/2020 10:12        Scheduled Meds: . alfuzosin  10 mg Oral Q breakfast  . aspirin EC  81 mg Oral Q breakfast  . atorvastatin  20 mg Oral Q breakfast  . guaiFENesin  600 mg Oral BID  . insulin aspart  0-9 Units Subcutaneous TID WC  . lidocaine-EPINEPHrine      . linagliptin  5 mg Oral Q breakfast  . metoprolol tartrate  50 mg Oral Q breakfast  . mirabegron ER  25 mg Oral Q breakfast  . scopolamine  1 patch Transdermal Q72H  . sodium chloride flush  3 mL Intravenous Q12H   Continuous Infusions: . sodium chloride 75 mL/hr at 02/11/20 0543     LOS: 0 days    Time spent:83min  Domenic Polite, MD [facility]  02/11/2020, 11:00 AM

## 2020-02-11 NOTE — Progress Notes (Signed)
PT Cancellation Note  Patient Details Name: [name] MRN: [id number] DOB: 03/26/1955   Cancelled Treatment:    Reason Eval/Treat Not Completed: Other (comment).  Standing without an AD in his room working on bathing.  Will retry at another time.   Ramond Dial 02/11/2020, 10:23 AM   Mee Hives, PT MS Acute Rehab Dept. Number: [place]

## 2020-02-11 NOTE — Progress Notes (Signed)
Patient non complaint with using call bell when needing assitance and follow safety protocl, patient repeatedly gets out of bed on his own and is non compliant with wearing heart rate monitor.

## 2020-02-11 NOTE — Consult Note (Signed)
Chief Complaint: Patient was seen in consultation today for right nephrostomy placement.  Referring Physician(s): Fuller Plan  Supervising Physician: Ruthann Cancer  Patient Status: [facility] - In-pt  History of Present Illness: MACDONALD RIGOR is a 64 y.o. male with a past medical history significant for depression, gout, GERD, HTN, HLD, DM, prostate cancer, BPH, nephrolithiasis and right sided hydroureteronephrosis followed by Dr. Felipa Eth seen today for a right nephrostomy placement. Mr. Sunde presented to [facility] ED yesterday with complaints of n/v, lower right back pain and weakness x 2 days. CT renal stone study was performed which showed chronic right renal atrophy with moderate to severe hydronephrosis and hydroureter, severe right hydroureter noted distally from the bladder up to the surgical anastomosis at the psoas hitch, nonobstructing inferior pole right kidney stone. He was admitted for further evaluation and management. It was noted that he had been seen by Dr. Felipa Eth on 11/10 for chronic ureteral obstruction and had been planned for a right nephrostomy placement on 11/16 however this was cancelled due to a lack of transportation. On-call urologist, Dr. Alyson Ingles, was consulted and it was recommended to proceed for a right nephrostomy tube placement.   Mr. Aber seen in IR holding, he continues to have some back pain but reports that his nausea has improved. He is asking for a urinal. He is wondering why a stent can't be placed through his urethra as he was previously told. He is concerned about having to carry a bag around. We discussed the rationale for the procedure today and all questions were answered - he states understanding and is agreeable to proceed as planned.  Past Medical History:  Diagnosis Date  . Closed nondisplaced spiral fracture of shaft of right tibia   . Complicated urinary tract infection 09/06/2016  . Depression   . Diabetes mellitus without complication ([place])   .  GERD (gastroesophageal reflux disease)   . Gout    right foot  . Hemorrhoids   . Hypertension   . Hypertension   . Macular degeneration   . Microcytic anemia   . Prostate cancer ([place]) 08/2009  . Rectal bleeding 07/31/2016  . Tubular adenoma     Past Surgical History:  Procedure Laterality Date  . CYSTOSCOPY WITH RETROGRADE PYELOGRAM, URETEROSCOPY AND STENT PLACEMENT Right 07/31/2016   Procedure: CYSTOSCOPY WITH RIGHT  RETROGRADE PYELOGRAM, URETEROSCOPY;  Surgeon: [name], MD;  Location: [facility] ORS;  Service: Urology;  Laterality: Right;  . INGUINAL HERNIA REPAIR    . Left ankle joint fusion  1981  . PROSTATE BIOPSY     x 2  . URETERAL REIMPLANTION  07/31/2016   Procedure: URETERAL REIMPLANT, right boari flap, right psoas hitch;  Surgeon: [name], MD;  Location: [facility] ORS;  Service: Urology;;    Allergies: Patient has no known allergies.  Medications: Prior to Admission medications   Medication Sig Start Date End Date Taking? Authorizing Provider  alfuzosin (UROXATRAL) 10 MG 24 hr tablet Take 10 mg by mouth daily with breakfast.  09/27/19  Yes [provider]  aspirin EC 81 MG tablet Take 81 mg by mouth daily with breakfast.    Yes [provider]  atorvastatin (LIPITOR) 20 MG tablet TAKE 1 TABLET BY MOUTH EVERY DAY Patient taking differently: Take 20 mg by mouth daily with breakfast.  09/30/19  Yes [name], [name]  guaiFENesin (MUCINEX) 600 MG 12 hr tablet Take 1 tablet (600 mg total) by mouth 2 (two) times daily. 03/25/19 03/24/20 Yes [name],  [name]  metoprolol tartrate (LOPRESSOR) 50 MG tablet TAKE 1 TABLET BY MOUTH EVERY DAY Patient taking differently: Take 50 mg by mouth daily with breakfast. (BETA BLOCKER) 10/07/19  Yes [name], [name]  Multiple Vitamin (MULTIVITAMIN WITH MINERALS) TABS tablet Take 1 tablet by mouth daily with breakfast.   Yes [provider]  MYRBETRIQ 25 MG TB24 tablet TAKE 1 TABLET BY MOUTH EVERY DAY Patient  taking differently: Take 25 mg by mouth daily with breakfast.  08/13/19  Yes [name], [name]  nystatin (NYSTATIN) powder Apply 1 application topically 3 (three) times daily. Patient taking differently: Apply 1 application topically 2 (two) times daily as needed (itching/rash).  08/26/19  Yes [name], [name]  olmesartan-hydrochlorothiazide (BENICAR HCT) 40-25 MG tablet Take 1 tablet by mouth daily with breakfast.  10/26/19  Yes [provider]  RYBELSUS 14 MG TABS TAKE 1 TABLET BY MOUTH DAILY. [place] Patient taking differently: Take 14 mg by mouth daily before breakfast. 30 minutes before breakfast 09/23/19  Yes [name], [name]  sildenafil (REVATIO) 20 MG tablet TAKE 1 TABLET BY MOUTH EVERY DAY (PRIOR AUTHORIZATION DENIED) Patient taking differently: Take 20 mg by mouth daily as needed (erectile dysfunction).  01/18/20  Yes [name], [name]  Tetrahydrozoline HCl (VISINE OP) Place 1 drop into both eyes daily as needed (itching/irritation).   Yes [provider]  TRADJENTA 5 MG TABS tablet TAKE 1 TABLET BY MOUTH EVERY DAY Patient taking differently: Take 5 mg by mouth daily with breakfast.  12/29/19  Yes [name], [name]  traMADol (ULTRAM) 50 MG tablet Take 1 tablet (50 mg total) by mouth every 6 (six) hours as needed for severe pain. 10/05/19  Yes Thurnell Lose, MD  traZODone (DESYREL) 50 MG tablet Take 1 tablet (50 mg total) by mouth at bedtime as needed for sleep. 12/13/19  Yes [name], [name]  HYDROcodone-acetaminophen (NORCO) 5-325 MG tablet Take 1 tablet by mouth every 6 (six) hours as needed for moderate pain. Patient not taking: Reported on 12/13/2019 10/07/19   [name], [name]     Family History  Problem Relation Age of Onset  . Lung cancer Mother        Deceased  . Throat cancer Brother   . Pancreatic cancer Father   . Heart disease Father        Deceased  . Lung cancer Maternal Uncle        nephew  . Diabetes Other     Social  History   Socioeconomic History  . Marital status: Divorced    Spouse name: Not on file  . Number of children: 0  . Years of education: Not on file  . Highest education level: Not on file  Occupational History  . Occupation: retired  Tobacco Use  . Smoking status: Never Smoker  . Smokeless tobacco: Never Used  Vaping Use  . Vaping Use: Never used  Substance and Sexual Activity  . Alcohol use: Yes    Comment: ocassionally  . Drug use: No  . Sexual activity: Yes  Other Topics Concern  . Not on file  Social History Narrative  . Not on file   Social Determinants of Health   Financial Resource Strain: Low Risk   . Difficulty of Paying Living Expenses: Not hard at all  Food Insecurity: No Food Insecurity  . Worried About Charity fundraiser in the Last Year: Never true  . Ran Out of Food in the Last Year: Never true  Transportation Needs: No Transportation Needs  . Lack of Transportation (Medical): No  . Lack of Transportation (Non-Medical): No  Physical Activity: Insufficiently Active  . Days of Exercise per Week: 3 days  . Minutes of Exercise per Session: 10 min  Stress: No Stress Concern Present  . Feeling of Stress : Only a little  Social Connections:   . Frequency of Communication with Friends and Family: Not on file  . Frequency of Social Gatherings with Friends and Family: Not on file  . Attends Religious Services: Not on file  . Active Member of Clubs or Organizations: Not on file  . Attends Archivist Meetings: Not on file  . Marital Status: Not on file     Review of Systems: A 12 point ROS discussed and pertinent positives are indicated in the HPI above.  All other systems are negative.  Review of Systems  Constitutional: Negative for chills and fever.  Respiratory: Negative for cough and shortness of breath.   Cardiovascular: Negative for chest pain.  Gastrointestinal: Negative for abdominal pain, nausea and vomiting.  Genitourinary: Positive  for flank pain.  Musculoskeletal: Positive for back pain.  Neurological: Negative for headaches.    Vital Signs: BP 125/76 (BP Location: Left Arm)   Pulse 71   Temp 98.1 F (36.7 C)   Resp 18   Ht 5\' 6"  (1.676 m)   Wt 175 lb 11.3 oz (79.7 kg)   SpO2 98%   BMI 28.36 kg/m   Physical Exam Vitals and nursing note reviewed.  Constitutional:      General: He is not in acute distress. HENT:     Head: Normocephalic.     Mouth/Throat:     Mouth: Mucous membranes are moist.     Pharynx: Oropharynx is clear. No oropharyngeal exudate or posterior oropharyngeal erythema.  Cardiovascular:     Rate and Rhythm: Normal rate and regular rhythm.  Pulmonary:     Effort: Pulmonary effort is normal.     Breath sounds: Normal breath sounds.  Abdominal:     General: There is no distension.     Palpations: Abdomen is soft.     Tenderness: There is no abdominal tenderness.  Skin:    General: Skin is warm and dry.  Neurological:     Mental Status: He is alert and oriented to person, place, and time.  Psychiatric:        Mood and Affect: Mood normal.        Behavior: Behavior normal.        Thought Content: Thought content normal.        Judgment: Judgment normal.      MD Evaluation Airway: WNL Heart: WNL Abdomen: WNL Chest/ Lungs: WNL ASA  Classification: 3 Mallampati/Airway Score: Two   Imaging: DG Chest Portable 1 View  Result Date: 02/10/2020 CLINICAL DATA:  Shortness of breath and weakness. EXAM: PORTABLE CHEST 1 VIEW COMPARISON:  09/28/2019 FINDINGS: 0919 hours. The lungs are clear without focal pneumonia, edema, pneumothorax or pleural effusion. Stable blunting cardiophrenic angle at the left base. Patchy basilar airspace disease noted right lung base previously has resolved. The cardiopericardial silhouette is within normal limits for size. The visualized bony structures of the thorax show no acute abnormality. Telemetry leads overlie the chest. IMPRESSION: No active disease.  Electronically Signed   By: Misty Stanley M.D.   On: 02/10/2020 09:30   CT Renal Stone Study  Result Date: 02/10/2020 CLINICAL DATA:  Flank pain.  Evaluate for a kidney  stone. EXAM: CT ABDOMEN AND PELVIS WITHOUT CONTRAST TECHNIQUE: Multidetector CT imaging of the abdomen and pelvis was performed following the standard protocol without IV contrast. COMPARISON:  10/02/2019 FINDINGS: Lower chest: No acute abnormality. Hepatobiliary: No focal liver abnormality is seen. No gallstones, gallbladder wall thickening, or biliary dilatation. Pancreas: Unremarkable. No pancreatic ductal dilatation or surrounding inflammatory changes. Spleen: Normal in size without focal abnormality. Adrenals/Urinary Tract: Normal adrenal glands. Bilateral kidney cysts are again noted, incompletely characterized without IV contrast. The largest arises from the inferior pole of the right kidney measuring 4.3 cm. Asymmetric right renal atrophy. Moderate to severe right hydronephrosis is similar to the previous exam. Marked hydronephrosis involving the distal ureter extending from the bladder up to the surgical anastomosis at the psoas hitch. This is unchanged from 09/22/2019. No right ureteral calculi. A small stone is noted within the inferior pole collecting system of the right kidney measuring 3 mm, image 43/3. No left renal calculi, left hydronephrosis, or left ureteral calculi. No stones identified within the urinary bladder. Stomach/Bowel: Stomach is within normal limits. Appendix appears normal. No evidence of bowel wall thickening, distention, or inflammatory changes. Vascular/Lymphatic: Aortic atherosclerosis. No aneurysm. No abdominopelvic adenopathy. Reproductive: Prostate gland enlargement. Other: No free fluid or fluid collections. Musculoskeletal: No acute or significant osseous findings. IMPRESSION: 1. No acute findings identified within the abdomen or pelvis. No obstructing calculi identified. 2. Chronic right renal atrophy  with moderate to severe hydronephrosis and hydroureter. Severe right hydroureter is noted distally from the bladder up to the surgical anastomosis at the psoas hitch. Unchanged from previous exam. 3. Nonobstructing inferior pole right kidney stone. 4. Aortic atherosclerosis. 5. Prostate gland enlargement. Aortic Atherosclerosis (ICD10-I70.0). Electronically Signed   By: Kerby Moors M.D.   On: 02/10/2020 10:12    Labs:  CBC: Recent Labs    10/04/19 0426 10/05/19 0209 02/10/20 0912 02/11/20 0133  WBC 10.9* 11.2* 7.7 7.0  HGB 16.6 16.6 17.0 16.1  HCT 50.6 51.3 52.6* 49.5  PLT 271 276 171 169    COAGS: Recent Labs    09/28/19 1220 10/02/19 1910  INR 1.0 1.1    BMP: Recent Labs    10/03/19 0159 10/03/19 0159 10/04/19 0426 10/04/19 0426 10/05/19 0209 12/13/19 1513 02/10/20 0912 02/11/20 0133  NA 137   < > 138   < > 136 140 135 137  K 4.9   < > 5.0   < > 4.6 4.8 4.1 4.1  CL 103   < > 104   < > 99 102 102 107  CO2 22   < > 25   < > 30 18* 19* 20*  GLUCOSE 145*   < > 142*   < > 195* 153* 157* 90  BUN 49*   < > 41*   < > 29* 33* 41* 36*  CALCIUM 9.4   < > 9.2   < > 9.1 9.8 9.1 8.9  CREATININE 1.62*   < > 1.75*   < > 1.69* 1.41* 2.20* 1.75*  GFRNONAA 44*   < > 40*   < > 42* 52* 32* 42*  GFRAA 51*  --  46*  --  48* 60  --   --    < > = values in this interval not displayed.    LIVER FUNCTION TESTS: Recent Labs    10/04/19 0426 10/05/19 0209 12/13/19 1513 02/10/20 0912  BILITOT 0.9 0.8 0.3 1.3*  AST 30 18 25 20   ALT 77* 53* 33 18  ALKPHOS  83 86 137* 74  PROT 6.4* 6.5 7.3 6.8  ALBUMIN 2.9* 3.0* 4.5 3.9    TUMOR MARKERS: No results for input(s): AFPTM, CEA, CA199, CHROMGRNA in the last 8760 hours.  Assessment and Plan:  64 y/o M with history of nephrolithiasis and right sided hydroureteronephrosis who presented to the ED yesterday with complaints of n/v and flank pain. He had previously been planned for right PCN placement as an outpatient per the request of  Dr. Felipa Eth however this was cancelled due to transportation issues. IR has been asked to place a right PCN during this admission for symptom management/decompression.  Patient has been NPO since at least midnight, last dose of heparin SQ 0542 today. Afebrile, WBC 7.0, hgb 16.1, plt 169, creatinine 2.20.  Risks and benefits of right PCN placement was discussed with the patient including, but not limited to, infection, bleeding, significant bleeding causing loss or decrease in renal function or damage to adjacent structures.   All of the patient's questions were answered, patient is agreeable to proceed.  Consent signed and in chart.  Thank you for this interesting consult.  I greatly enjoyed meeting CAYTON CUEVAS and look forward to participating in their care.  A copy of this report was sent to the requesting provider on this date.  Electronically Signed: Joaquim Nam, PA-C 02/11/2020, 10:41 AM   I spent a total of 40 Minutes in face to face in clinical consultation, greater than 50% of which was counseling/coordinating care for right PCN placement.

## 2020-02-11 NOTE — Progress Notes (Signed)
Pt continues to attempt to get out of bed unassisted. Placed call light in pt hand and pt does not utilize to call light. Pt continues to sway when first standing, verbalizes that he just wants to stand up because of his back pain. Remained with patient for extended amount of time to allow pt to ambulate assisted in room. Provided patient with heat pads, assisted back to bed. Once in bed pt again attempted to stand unassisted. At this time pt refusing safety protocols. Will continue to educate pt on fall safety measures. Will continue to monitor

## 2020-02-11 NOTE — Progress Notes (Signed)
Initial Nutrition Assessment  DOCUMENTATION CODES:   Not applicable  INTERVENTION:  Ensure Enlive po TID, each supplement provides 350 kcal and 20 grams of protein  MVI daily   NUTRITION DIAGNOSIS:   Inadequate oral intake related to poor appetite as evidenced by per patient/family report.    GOAL:   Patient will meet greater than or equal to 90% of their needs    MONITOR:   Labs, Weight trends, PO intake, Supplement acceptance, I & O's  REASON FOR ASSESSMENT:   Malnutrition Screening Tool    ASSESSMENT:   Pt admitted with acute kidney injury superimposed on chronic kidney disease stage III. PMH includes chronic R-sided hydroureteronephrosis, prostate cancer, macular degeneration, type 2 DM.  Pt unavailable at time of RD visit. Per H&P, pt reports persistent N/V to the point where he has been unable to keep down any significant food or liquids. MD questioning if this could be related to gastroparesis given pt's long history of uncontrolled type 2 DM.   IR consulted to place R PCN during this admission.  No PO intake documented.  UOP: 362ml documented x24 hours  Labs: CBGs 97-106 Medications: ss novolog, tradjenta 5mg  daily  Diet Order:   Diet Order            Diet Carb Modified Fluid consistency: Thin; Room service appropriate? Yes  Diet effective now                 EDUCATION NEEDS:   No education needs have been identified at this time  Skin:  Skin Assessment: Reviewed RN Assessment  Last BM:  11/23  Height:   Ht Readings from Last 1 Encounters:  02/10/20 5\' 6"  (1.676 m)    Weight:   Wt Readings from Last 1 Encounters:  02/10/20 79.7 kg   BMI:  Body mass index is 28.36 kg/m.  Estimated Nutritional Needs:   Kcal:  2000-2200  Protein:  100-115 grams  Fluid:  >/=2L/d    Larkin Ina, MS, RD, LDN RD pager number and weekend/on-call pager number located in [place].

## 2020-02-11 NOTE — Progress Notes (Signed)
Pt states he does not want to have procedure done at this time MD notified, Dr Broadus John who spoke with pt on the phone report called to [phone].

## 2020-02-11 NOTE — Evaluation (Signed)
Physical Therapy Evaluation Patient Details Name: [name] MRN: [id number] DOB: [birth year] Today's Date: 02/11/2020   History of Present Illness  64 yo male with onset of SOB and weakness was brought to ED, now noted to have AKI, R hydronephrosis, N&V.  Scheduled for nephrostomy tube but pt refused at last minute.  PMHx:  prostate CA, nephrolithiasis, chronic R hydronephrosis, obseity, macular degeneration, DM,   Clinical Impression  Pt was seen for mobility on hallway, initially refusing to use a RW.  HHA required use of hallway rail for stability, and was not successful.  Returned to room and used RW with better stability but pt persisted in trying to walk away from it.  Follow for PT goals as planned, working on balance and quality of movement to incorporate into safer gait and esp stairs.  Reinforce safety precautions, such as hand placement with transfers and asking for help to move.    Follow Up Recommendations CIR    Equipment Recommendations  Hospital bed    Recommendations for Other Services Rehab consult     Precautions / Restrictions Precautions Precautions: Fall Precaution Comments: impulsive Restrictions Weight Bearing Restrictions: No Other Position/Activity Restrictions: monitor for safety with gait, tending to leave walker behind      Mobility  Bed Mobility Overal bed mobility: Needs Assistance Bed Mobility: Supine to Sit;Sit to Supine     Supine to sit: Min assist Sit to supine: Min assist        Transfers Overall transfer level: Needs assistance Equipment used: Rolling walker (2 wheeled);1 person hand held assist Transfers: Sit to/from Stand Sit to Stand: Min assist         General transfer comment: min assist for safety and to power up  Ambulation/Gait Ambulation/Gait assistance: Min guard;Min assist Gait Distance (Feet): 80 Feet Assistive device: 1 person hand held assist Gait Pattern/deviations: Step-through pattern;Step-to  pattern;Decreased stride length;Wide base of support Gait velocity: reduced Gait velocity interpretation: <1.31 ft/sec, indicative of household ambulator    Stairs            Wheelchair Mobility    Modified Rankin (Stroke Patients Only)       Balance Overall balance assessment: Needs assistance Sitting-balance support: Feet supported Sitting balance-Leahy Scale: Fair     Standing balance support: Bilateral upper extremity supported;During functional activity Standing balance-Leahy Scale: Good                               Pertinent Vitals/Pain Pain Assessment: No/denies pain    Home Living Family/patient expects to be discharged to:: Private residence Living Arrangements: Non-relatives/Friends Available Help at Discharge: Friend(s) Type of Home: Apartment       Home Layout: Two level Home Equipment: Grab bars - tub/shower;Shower seat;Walker - 2 wheels Additional Comments: pt is unwilling to commit to walker and is unsafe with HHA and wall rail from PT    Prior Function Level of Independence: Needs assistance   Gait / Transfers Assistance Needed: had falls but walked without AD  ADL's / Homemaking Assistance Needed: aide for housework and self care        Hand Dominance   Dominant Hand: Right    Extremity/Trunk Assessment   Upper Extremity Assessment Upper Extremity Assessment: Overall WFL for tasks assessed    Lower Extremity Assessment Lower Extremity Assessment: Generalized weakness    Cervical / Trunk Assessment Cervical / Trunk Assessment: Kyphotic  Communication   Communication: No difficulties  Cognition Arousal/Alertness: Awake/alert Behavior During Therapy: Impulsive Overall Cognitive Status: Impaired/Different from baseline Area of Impairment: Safety/judgement;Awareness;Problem solving;Following commands;Attention                   Current Attention Level: Selective   Following Commands: Follows one step  commands inconsistently;Follows one step commands with increased time Safety/Judgement: Decreased awareness of safety;Decreased awareness of deficits Awareness: Intellectual Problem Solving: Slow processing;Requires verbal cues;Requires tactile cues General Comments: pt is convinced he does not need an AD and will try to walk away without it      General Comments General comments (skin integrity, edema, etc.): Pt demonstrates a struggle to decide to use AD, and with HHA was attempted with poor quality gait resulting.  HHA on wall rail and HHA with PT    Exercises     Assessment/Plan    PT Assessment Patient needs continued PT services  PT Problem List Decreased strength;Decreased range of motion;Decreased activity tolerance;Decreased balance;Decreased mobility;Decreased coordination;Decreased cognition;Decreased knowledge of use of DME;Decreased safety awareness;Decreased knowledge of precautions;Cardiopulmonary status limiting activity       PT Treatment Interventions DME instruction;Gait training;Stair training;Functional mobility training;Therapeutic activities;Therapeutic exercise;Balance training;Neuromuscular re-education;Patient/family education    PT Goals (Current goals can be found in the Care Plan section)  Acute Rehab PT Goals Patient Stated Goal: to go home PT Goal Formulation: With patient Time For Goal Achievement: 02/25/20 Potential to Achieve Goals: Good    Frequency Min 3X/week   Barriers to discharge Inaccessible home environment has stairs to enter hosue    Co-evaluation               AM-PAC PT "6 Clicks" Mobility  Outcome Measure Help needed turning from your back to your side while in a flat bed without using bedrails?: A Little Help needed moving from lying on your back to sitting on the side of a flat bed without using bedrails?: A Lot Help needed moving to and from a bed to a chair (including a wheelchair)?: A Lot Help needed standing up from a  chair using your arms (e.g., wheelchair or bedside chair)?: A Little Help needed to walk in hospital room?: A Little Help needed climbing 3-5 steps with a railing? : A Lot 6 Click Score: 15    End of Session Equipment Utilized During Treatment: Gait belt Activity Tolerance: Patient tolerated treatment well;Treatment limited secondary to medical complications (Comment) Patient left: in bed;with call bell/phone within reach;with bed alarm set Nurse Communication: Mobility status PT Visit Diagnosis: Unsteadiness on feet (R26.81);Muscle weakness (generalized) (M62.81);Difficulty in walking, not elsewhere classified (R26.2);Adult, failure to thrive (R62.7)    Time: 3532-9924 PT Time Calculation (min) (ACUTE ONLY): 31 min   Charges:   PT Evaluation $PT Eval Moderate Complexity: 1 Mod PT Treatments $Gait Training: 8-22 mins       Ramond Dial 02/11/2020, 6:10 PM   Mee Hives, PT MS Acute Rehab Dept. Number: [place]

## 2020-02-12 DIAGNOSIS — N179 Acute kidney failure, unspecified: Secondary | ICD-10-CM | POA: Diagnosis not present

## 2020-02-12 DIAGNOSIS — N189 Chronic kidney disease, unspecified: Secondary | ICD-10-CM | POA: Diagnosis not present

## 2020-02-12 LAB — BASIC METABOLIC PANEL
Anion gap: 12 (ref 5–15)
BUN: 23 mg/dL (ref 8–23)
CO2: 19 mmol/L — ABNORMAL LOW (ref 22–32)
Calcium: 9.5 mg/dL (ref 8.9–10.3)
Chloride: 107 mmol/L (ref 98–111)
Creatinine, Ser: 1.41 mg/dL — ABNORMAL HIGH (ref 0.61–1.24)
GFR, Estimated: 55 mL/min — ABNORMAL LOW (ref >60)
Glucose, Bld: 149 mg/dL — ABNORMAL HIGH (ref 70–99)
Potassium: 4.1 mmol/L (ref 3.5–5.1)
Sodium: 138 mmol/L (ref 135–145)

## 2020-02-12 LAB — GLUCOSE, CAPILLARY
Glucose-Capillary: 145 mg/dL — ABNORMAL HIGH (ref 70–99)
Glucose-Capillary: 126 mg/dL — ABNORMAL HIGH (ref 70–99)

## 2020-02-12 MED ORDER — ACETAMINOPHEN 325 MG PO TABS
650.0000 mg | ORAL_TABLET | Freq: Four times a day (QID) | ORAL | Status: DC | PRN
Start: 2020-02-12 — End: 2021-09-11

## 2020-02-12 NOTE — TOC Initial Note (Signed)
Transition of Care [facility]) - Initial/Assessment Note    Patient Details  Name: [name] MRN: [id number] Date of Birth: May 22, 1955  Transition of Care [facility]) CM/SW Contact:    Bartholomew Crews, RN Phone Number: [phone] 02/12/2020, 10:59 AM  Clinical Narrative:                  Patient to transition home today. Spoke with patient at the bedside. He stated that a friend will pick him up at 2pm. He usually usess a cane, but does have a RW at home. Stated he is followed by NP at [facility] for primary care. Discussed HH PT recommendation. Patient agreeable. Agency choice offered. Pending referral. TOC following for transition needs.   Expected Discharge Plan: [name]     Patient Goals and CMS Choice Patient states their goals for this hospitalization and ongoing recovery are:: return home CMS Medicare.gov Compare Post Acute Care list provided to:: Patient Choice offered to / list presented to : Patient  Expected Discharge Plan and Services Expected Discharge Plan: [place] In-house Referral: NA Discharge Planning Services: CM Consult Post Acute Care Choice: [place] arrangements for the past 2 months: Apartment Expected Discharge Date: 02/12/20               DME Arranged: N/A DME Agency: NA                  Prior Living Arrangements/Services Living arrangements for the past 2 months: Apartment Lives with:: Self Patient language and need for interpreter reviewed:: Yes Do you feel safe going back to the place where you live?: Yes      Need for Family Participation in Patient Care: Yes (Comment) Care giver support system in place?: Yes (comment) Current home services: DME (cane, walker) Criminal Activity/Legal Involvement Pertinent to Current Situation/Hospitalization: No - Comment as needed  Activities of Daily Living Home Assistive Devices/Equipment: None ADL Screening (condition at time of  admission) Patient's cognitive ability adequate to safely complete daily activities?: Yes Is the patient deaf or have difficulty hearing?: No Does the patient have difficulty seeing, even when wearing glasses/contacts?: No Does the patient have difficulty concentrating, remembering, or making decisions?: No Patient able to express need for assistance with ADLs?: Yes Does the patient have difficulty dressing or bathing?: No Independently performs ADLs?: Yes (appropriate for developmental age) Does the patient have difficulty walking or climbing stairs?: Yes Weakness of Legs: Both Weakness of Arms/Hands: Both  Permission Sought/Granted                  Emotional Assessment Appearance:: Appears stated age Attitude/Demeanor/Rapport: Engaged Affect (typically observed): Accepting Orientation: : Oriented to Self, Oriented to Place, Oriented to  Time, Oriented to Situation Alcohol / Substance Use: Not Applicable Psych Involvement: No (comment)  Admission diagnosis:  Dehydration [E86.0] Right flank pain [R10.9] Intractable nausea and vomiting [R11.2] Acute kidney injury superimposed on chronic kidney disease ([place]) [N17.9, N18.9] AKI (acute kidney injury) ([place]) [N17.9] Patient Active Problem List   Diagnosis Date Noted  . AKI (acute kidney injury) ([place]) 02/11/2020  . Acute kidney injury superimposed on chronic kidney disease ([place]) 02/10/2020  . Hydroureteronephrosis 02/10/2020  . Nausea and vomiting 02/10/2020  . History of COVID-19 02/10/2020  . Pneumonia due to COVID-19 virus 09/29/2019  . Acute on chronic kidney failure ([place]) 09/28/2019  . Hyponatremia 09/28/2019  . Sinus tachycardia 09/28/2019  . Insomnia 08/26/2018  . Nephropathy 03/26/2018  .  Urinary urgency 03/26/2018  . Type 2 diabetes mellitus with diabetic nephropathy, without long-term current use of insulin ([place]) 03/26/2018  . Essential hypertension 03/26/2018  . Chronic kidney disease (CKD), stage III (moderate)  ([place]) 03/26/2018  . Conductive hearing loss, bilateral 11/07/2017  . Uncontrolled type 2 diabetes mellitus with hyperglycemia ([place]) 07/31/2016  . Dyslipidemia associated with type 2 diabetes mellitus ([place]) 05/29/2016  . Microcytic anemia 02/17/2012  . 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 Benign hypertension 02/17/2012  . Depressive disorder 02/17/2012   PCP:  [name], [name] Pharmacy:   [id number] [place], [place] [address] Phone: [phone] Fax: [phone]     Social Determinants of Health ([place]) Interventions    Readmission Risk Interventions Readmission Risk Prevention Plan 10/05/2019  Transportation Screening Complete  PCP or Specialist Appt within 5-7 Days Complete  Home Care Screening Complete  Medication Review (RN CM) Complete  Some recent data might be hidden

## 2020-02-12 NOTE — TOC Transition Note (Signed)
Transition of Care [facility]) - CM/SW Discharge Note   Patient Details  Name: [name] MRN: [id number] Date of Birth: [birth year]  Transition of Care [facility]) CM/SW Contact:  Bartholomew Crews, RN Phone Number: [phone] 02/12/2020, 4:46 PM   Clinical Narrative:     Referral for [facility] PT accepted by [facility] for start of care on Monday. No further TOC needs identified.   Final next level of care: [place] Barriers to Discharge: No Barriers Identified   Patient Goals and CMS Choice Patient states their goals for this hospitalization and ongoing recovery are:: return home CMS Medicare.gov Compare Post Acute Care list provided to:: Patient Choice offered to / list presented to : Patient  Discharge Placement                       Discharge Plan and Services In-house Referral: NA Discharge Planning Services: CM Consult Post Acute Care Choice: Home Health          DME Arranged: N/A DME Agency: NA       HH Arranged: PT HH Agency: [facility] Date [place]: 02/12/20 Time [place]: 0076 Representative spoke with at [place]: Rolly Pancake  Social Determinants of Health ([place]) Interventions     Readmission Risk Interventions Readmission Risk Prevention Plan 10/05/2019  Transportation Screening Complete  PCP or Specialist Appt within 5-7 Days Complete  Home Care Screening Complete  Medication Review (RN CM) Complete  Some recent data might be hidden

## 2020-02-12 NOTE — Discharge Summary (Signed)
Physician Discharge Summary  [name] PYP:[id number] DOB: 03/01/1956 DOA: 02/10/2020  PCP: [name], [name]  Admit date: 02/10/2020 Discharge date: 02/12/2020  Time spent: 35 minutes  Recommendations for Outpatient Follow-up:  PCP [name] in 1 week Urology Dr. Felipa Eth in 1 to 2 weeks Home health PT  Discharge Diagnoses:  Principal Problem:   Acute kidney injury superimposed on chronic kidney disease 3a(HCC) Chronic right hydronephrosis  Noncompliance   Type 2 diabetes mellitus with diabetic nephropathy, without long-term current use of insulin (HCC)   Hydroureteronephrosis   Nausea and vomiting   History of COVID-19   AKI (acute kidney injury) [facility])  Discharge Condition: Stable  Diet recommendation: Diabetic, heart healthy  Filed Weights   02/10/20 0909 02/10/20 1822 02/10/20 2230  Weight: 81.6 kg 79.7 kg 79.7 kg    History of present illness:  64 year old male with history of prostate cancer, prior nephrolithiasis and chronic right hydronephrosis followed by urologist in [place], obesity, macular degeneration, type 2 diabetes mellitus was being followed by Dr. Felipa Eth at [facility] urology with regards to his hydronephrosis and prostate cancer, patient was due to have a right nephrostomy drain placed on the 16th however he missed his appointment reportedly due to transportation issues. -Presented to the ER yesterday with fatigue, nausea, chronic right flank pain -In the ED work-up was notable for worsening of creatinine up to 2.2 from baseline, CT renal protocol revealed no acute findings, chronic renal atrophy with moderate to severe right hydronephrosis and hydroureter which is unchanged from prior, urinalysis without signs of infection  Hospital Course:    Acute kidney injury on chronic kidney disease stage IIIa Chronic right hydronephrosis -Baseline creatinine around 1.6, creatinine was 2.2 on admission likely multifactorial from previous renal losses,  ongoing ARB use and chronic hydronephrosis -Patient is followed by Dr. Felipa Eth at [facility] urology for his chronic right hydronephrosis and prostate cancer and was due to have a right nephrostomy drain placed on the 16th of this month however he did not show up for the appointment -Creatinine improved with hydration back to 1.4 today which is better than his recent baseline -Case was discussedwith Dr. Alyson Ingles urology on-call  on admission who recommended IR consult for right nephrostomy drain placement,  which was was due to be completed 10 days ago [facility] anyway. -IR consulted, patient was taken to the radiology suite for nephrostomy placement, on arrival there he refused the procedure, quoted anxiety and social/girlfriend as main issues -Since his creatinine has improved and this is a chronic problem he is advised to follow-up with his urologist Dr. Felipa Eth to reschedule this, advised against delaying nephrostomy drain placement in order to protect his kidneys  Nausea  -No signs of infection at this time, resolved, eating all his meals   Type 2 diabetes mellitus -Last hemoglobin A1c was 7.7 -Resumed oral hypoglycemics at discharge  Essential hypertension -Continue metoprolol, discontinued in the setting of AKI  Prostate cancer -Follow-up with urology Dr. Felipa Eth  Noncompliance -Patient exhibits significant impulsivity, limited insight and judgment -OT recommended SNF which patient declines, he will be discharged home with home health PT services  Discharge Exam: Vitals:   02/12/20 0550 02/12/20 1022  BP: 118/83 (!) 142/88  Pulse: 79 73  Resp: 17 20  Temp: 98.2 F (36.8 C) 97.7 F (36.5 C)  SpO2: 96% 98%    General: Awake alert oriented x3 Cardiovascular: S1-S2, regular rate rhythm Respiratory: Clear  Discharge Instructions   Discharge Instructions    Diet - low  sodium heart healthy   Complete by: As directed    Diet Carb Modified   Complete by: As  directed    Increase activity slowly   Complete by: As directed      Allergies as of 02/12/2020   No Known Allergies     Medication List    STOP taking these medications   HYDROcodone-acetaminophen 5-325 MG tablet Commonly known as: Norco   olmesartan-hydrochlorothiazide 40-25 MG tablet Commonly known as: BENICAR HCT     TAKE these medications   acetaminophen 325 MG tablet Commonly known as: TYLENOL Take 2 tablets (650 mg total) by mouth every 6 (six) hours as needed for mild pain (or Fever >/= 101).   alfuzosin 10 MG 24 hr tablet Commonly known as: UROXATRAL Take 10 mg by mouth daily with breakfast.   aspirin EC 81 MG tablet Take 81 mg by mouth daily with breakfast.   atorvastatin 20 MG tablet Commonly known as: LIPITOR TAKE 1 TABLET BY MOUTH EVERY DAY What changed: when to take this   guaiFENesin 600 MG 12 hr tablet Commonly known as: Mucinex Take 1 tablet (600 mg total) by mouth 2 (two) times daily.   metoprolol tartrate 50 MG tablet Commonly known as: LOPRESSOR TAKE 1 TABLET BY MOUTH EVERY DAY What changed:   when to take this  additional instructions   multivitamin with minerals Tabs tablet Take 1 tablet by mouth daily with breakfast.   Myrbetriq 25 MG Tb24 tablet Generic drug: mirabegron ER TAKE 1 TABLET BY MOUTH EVERY DAY What changed:   how much to take  when to take this   nystatin powder Commonly known as: nystatin Apply 1 application topically 3 (three) times daily. What changed:   when to take this  reasons to take this   Rybelsus 14 MG Tabs Generic drug: Semaglutide TAKE 1 TABLET BY MOUTH DAILY. [place] What changed:   how much to take  when to take this   sildenafil 20 MG tablet Commonly known as: REVATIO TAKE 1 TABLET BY MOUTH EVERY DAY (PRIOR AUTHORIZATION DENIED) What changed:   how much to take  how to take this  when to take this  reasons to take this  additional instructions    Tradjenta 5 MG Tabs tablet Generic drug: linagliptin TAKE 1 TABLET BY MOUTH EVERY DAY What changed:   how much to take  when to take this   traMADol 50 MG tablet Commonly known as: ULTRAM Take 1 tablet (50 mg total) by mouth every 6 (six) hours as needed for severe pain.   traZODone 50 MG tablet Commonly known as: DESYREL Take 1 tablet (50 mg total) by mouth at bedtime as needed for sleep.   VISINE OP Place 1 drop into both eyes daily as needed (itching/irritation).      No Known Allergies  Follow-up Information    [place], Reece Leader., MD. Schedule an appointment as soon as possible for a visit in 1 week(s).   Specialty: Urology Contact information: [address] [place] [address] [phone]                The results of significant diagnostics from this hospitalization (including imaging, microbiology, ancillary and laboratory) are listed below for reference.    Significant Diagnostic Studies: DG Chest Portable 1 View  Result Date: 02/10/2020 CLINICAL DATA:  Shortness of breath and weakness. EXAM: PORTABLE CHEST 1 VIEW COMPARISON:  09/28/2019 FINDINGS: 0919 hours. The lungs are clear without focal pneumonia,  edema, pneumothorax or pleural effusion. Stable blunting cardiophrenic angle at the left base. Patchy basilar airspace disease noted right lung base previously has resolved. The cardiopericardial silhouette is within normal limits for size. The visualized bony structures of the thorax show no acute abnormality. Telemetry leads overlie the chest. IMPRESSION: No active disease. Electronically Signed   By: Misty Stanley M.D.   On: 02/10/2020 09:30   CT Renal Stone Study  Result Date: 02/10/2020 CLINICAL DATA:  Flank pain.  Evaluate for a kidney stone. EXAM: CT ABDOMEN AND PELVIS WITHOUT CONTRAST TECHNIQUE: Multidetector CT imaging of the abdomen and pelvis was performed following the standard protocol without IV contrast. COMPARISON:  10/02/2019  FINDINGS: Lower chest: No acute abnormality. Hepatobiliary: No focal liver abnormality is seen. No gallstones, gallbladder wall thickening, or biliary dilatation. Pancreas: Unremarkable. No pancreatic ductal dilatation or surrounding inflammatory changes. Spleen: Normal in size without focal abnormality. Adrenals/Urinary Tract: Normal adrenal glands. Bilateral kidney cysts are again noted, incompletely characterized without IV contrast. The largest arises from the inferior pole of the right kidney measuring 4.3 cm. Asymmetric right renal atrophy. Moderate to severe right hydronephrosis is similar to the previous exam. Marked hydronephrosis involving the distal ureter extending from the bladder up to the surgical anastomosis at the psoas hitch. This is unchanged from 09/22/2019. No right ureteral calculi. A small stone is noted within the inferior pole collecting system of the right kidney measuring 3 mm, image 43/3. No left renal calculi, left hydronephrosis, or left ureteral calculi. No stones identified within the urinary bladder. Stomach/Bowel: Stomach is within normal limits. Appendix appears normal. No evidence of bowel wall thickening, distention, or inflammatory changes. Vascular/Lymphatic: Aortic atherosclerosis. No aneurysm. No abdominopelvic adenopathy. Reproductive: Prostate gland enlargement. Other: No free fluid or fluid collections. Musculoskeletal: No acute or significant osseous findings. IMPRESSION: 1. No acute findings identified within the abdomen or pelvis. No obstructing calculi identified. 2. Chronic right renal atrophy with moderate to severe hydronephrosis and hydroureter. Severe right hydroureter is noted distally from the bladder up to the surgical anastomosis at the psoas hitch. Unchanged from previous exam. 3. Nonobstructing inferior pole right kidney stone. 4. Aortic atherosclerosis. 5. Prostate gland enlargement. Aortic Atherosclerosis (ICD10-I70.0). Electronically Signed   By: Kerby Moors M.D.   On: 02/10/2020 10:12    Microbiology: Recent Results (from the past 240 hour(s))  Resp Panel by RT-PCR (Flu A&B, Covid) Nasopharyngeal Swab     Status: None   Collection Time: 02/10/20  9:12 AM   Specimen: Nasopharyngeal Swab; Nasopharyngeal(NP) swabs in vial transport medium  Result Value Ref Range Status   SARS Coronavirus 2 by RT PCR NEGATIVE NEGATIVE Final    Comment: (NOTE) SARS-CoV-2 target nucleic acids are NOT DETECTED.  The SARS-CoV-2 RNA is generally detectable in upper respiratory specimens during the acute phase of infection. The lowest concentration of SARS-CoV-2 viral copies this assay can detect is 138 copies/mL. A negative result does not preclude SARS-Cov-2 infection and should not be used as the sole basis for treatment or other patient management decisions. A negative result may occur with  improper specimen collection/handling, submission of specimen other than nasopharyngeal swab, presence of viral mutation(s) within the areas targeted by this assay, and inadequate number of viral copies(<138 copies/mL). A negative result must be combined with clinical observations, patient history, and epidemiological information. The expected result is Negative.  Fact Sheet for Patients:  EntrepreneurPulse.com.au  Fact Sheet for Healthcare Providers:  IncredibleEmployment.be  This test is no t yet approved or cleared  by the [place] and  has been authorized for detection and/or diagnosis of SARS-CoV-2 by FDA under an Emergency Use Authorization (EUA). This EUA will remain  in effect (meaning this test can be used) for the duration of the COVID-19 declaration under Section 564(b)(1) of the Act, 21 U.S.C.section 360bbb-3(b)(1), unless the authorization is terminated  or revoked sooner.       Influenza A by PCR NEGATIVE NEGATIVE Final   Influenza B by PCR NEGATIVE NEGATIVE Final    Comment: (NOTE) The Xpert  Xpress SARS-CoV-2/FLU/RSV plus assay is intended as an aid in the diagnosis of influenza from Nasopharyngeal swab specimens and should not be used as a sole basis for treatment. Nasal washings and aspirates are unacceptable for Xpert Xpress SARS-CoV-2/FLU/RSV testing.  Fact Sheet for Patients: EntrepreneurPulse.com.au  Fact Sheet for Healthcare Providers: IncredibleEmployment.be  This test is not yet approved or cleared by the [place] FDA and has been authorized for detection and/or diagnosis of SARS-CoV-2 by FDA under an Emergency Use Authorization (EUA). This EUA will remain in effect (meaning this test can be used) for the duration of the COVID-19 declaration under Section 564(b)(1) of the Act, 21 U.S.C. section 360bbb-3(b)(1), unless the authorization is terminated or revoked.  Performed at [facility], [place] [address] [place], [place] [address]      Labs: Basic Metabolic Panel: Recent Labs  Lab 02/10/20 0912 02/11/20 0133 02/12/20 0746  NA 135 137 138  K 4.1 4.1 4.1  CL 102 107 107  CO2 19* 20* 19*  GLUCOSE 157* 90 149*  BUN 41* 36* 23  CREATININE 2.20* 1.75* 1.41*  CALCIUM 9.1 8.9 9.5   Liver Function Tests: Recent Labs  Lab 02/10/20 0912  AST 20  ALT 18  ALKPHOS 74  BILITOT 1.3*  PROT 6.8  ALBUMIN 3.9   No results for input(s): LIPASE, AMYLASE in the last 168 hours. No results for input(s): AMMONIA in the last 168 hours. CBC: Recent Labs  Lab 02/10/20 0912 02/11/20 0133  WBC 7.7 7.0  NEUTROABS 6.3  --   HGB 17.0 16.1  HCT 52.6* 49.5  MCV 87.8 87.6  PLT 171 169   Cardiac Enzymes: No results for input(s): CKTOTAL, CKMB, CKMBINDEX, TROPONINI in the last 168 hours. BNP: BNP (last 3 results) Recent Labs    10/02/19 0831 10/04/19 0426 10/05/19 0209  BNP 58.9 66.7 39.0    ProBNP (last 3 results) No results for input(s): PROBNP in the last 8760 hours.  CBG: Recent Labs  Lab 02/11/20 1244  02/11/20 1607 02/11/20 2104 02/12/20 0643 02/12/20 1140  GLUCAP 97 128* 178* 145* 126*   Signed:  Domenic Polite MD.  [facility] 02/12/2020, 2:40 PM

## 2020-02-12 NOTE — Progress Notes (Signed)
Occupational Therapy Evaluation Patient Details Name: [name] MRN: [id number] DOB: 12/12/1955 Today's Date: 02/12/2020    History of Present Illness 64 yo male with onset of SOB and weakness was brought to ED, now noted to have AKI, R hydronephrosis, N&V.  Scheduled for nephrostomy tube but pt refused at last minute.  PMHx:  prostate CA, nephrolithiasis, chronic R hydronephrosis, obseity, macular degeneration, DM,    Clinical Impression   Pt slightly poor historian 2/2 confusion, agitation, and impulsivity. No family present to confirm PLOF/home setup/AE/DME. Pt reports living alone in a 2 story townhouse apartment with no STE but ~20 steps up to 2nd level with hand railing (where main bedroom/bathroom are located). Pt reports turning his first floor family room into a bedroom and using the 1st floor bathroom recently. Pt reports having PRN S/A from an aide (a few times a week) and friends. The aide assists with cleaning, self-care, cooking and the friends help with grocery shopping and other errands.   Today, pt received standing at sink attempting to sponge bathe without staff, pt agreeable to OT eval. Pt demonstrated intact strength/ROM to extremities x4 and c/o back pain (nurse notified at beginning of eval). Pt presents with impulsivity, unsteadiness, and restlessness (requiring mod cues for sequencing and safety). Pt required min guard-hand held assist for functional mobility/transfers within room (without RW- however OT encouraged use of RW to promote safety), min guard for LB self-care, and setup-mod I for UB self-care. Educated pt on activity pacing, safety awareness, AE/DME, adaptive ADL strategies. Highly recommend SNF placement 2/2 fall risk, impulsivity, and confusion. If pt declines SNF placement, highly recommend [facility] 24/7 S/A with HHOT. Pt would benefit from continued skilled acute care OT services to maximize independence with ADLs/ADL mobility.     Follow Up Recommendations   SNF;Supervision/Assistance - 24 hour;Home health OT    Equipment Recommendations  None recommended by OT (pt confirms having AE/DME for home already)    Recommendations for Other Services  (None)     Precautions / Restrictions Precautions Precautions: Fall Precaution Comments: impulsive Restrictions Weight Bearing Restrictions: No Other Position/Activity Restrictions: monitor for safety with gait, prefers to not use RW      Mobility Bed Mobility Overal bed mobility: Needs Assistance Bed Mobility: Sit to Supine       Sit to supine: Supervision   General bed mobility comments: standing near sink upon arrival, placed back in supine at end of eval    Transfers Overall transfer level: Needs assistance Equipment used: 1 person hand held assist Transfers: Sit to/from Stand Sit to Stand: Min guard         General transfer comment: min assist for safety and to power up, hand held assist for ambulating within room without RW, encouraged pt to use RW    Balance Overall balance assessment: Needs assistance Sitting-balance support: Feet supported Sitting balance-Leahy Scale: Good     Standing balance support: Single extremity supported;During functional activity Standing balance-Leahy Scale: Fair Standing balance comment: min guard/hand held assist for ambulation/functional transfers without room without RW      ADL either performed or assessed with clinical judgement   ADL Overall ADL's : Needs assistance/impaired Eating/Feeding: Modified independent   Grooming: Wash/dry hands;Wash/dry face;Brushing hair;Sitting;Bed level Grooming Details (indicate cue type and reason): washed hands and face sitting, combed hair at bed level Upper Body Bathing: Minimal assistance;Min guard;Cueing for safety;Cueing for sequencing;Sitting;Standing Upper Body Bathing Details (indicate cue type and reason): pt sat and stood to sponge bathe  in front of sink, requiring assistance to reach  back and cues for thoroughness/sequencing Lower Body Bathing: Set up;Min guard;Cueing for safety;Cueing for sequencing;Sitting/lateral leans;Sit to/from stand Lower Body Bathing Details (indicate cue type and reason): pt sat and stood to sponge bathe in front of sink, requiring assistance to reach buttocks and cues for thoroughness/sequencing Upper Body Dressing : Set up;Sitting Upper Body Dressing Details (indicate cue type and reason): donned new hospital gown sitting EOB Lower Body Dressing: Supervision/safety;Sitting/lateral leans Lower Body Dressing Details (indicate cue type and reason): for sock management sitting EOB Toilet Transfer: Min guard;Cueing for safety;Cueing for sequencing;Ambulation;Regular Toilet (simulated toilet transfer) Toilet Transfer Details (indicate cue type and reason): ambuated from bed>toilet to urinate in standing (unsuccessful) with CGA/hand held assist; CGA/hand held assist to ambulate from chair at sink to bed simulating toilet transfer (multiple sit>stands from chair and bed surface) Toileting- Clothing Manipulation and Hygiene: Min guard;Cueing for safety;Cueing for sequencing;Sitting/lateral lean;Sit to/from stand Toileting - Clothing Manipulation Details (indicate cue type and reason): for perianal and perineal care in sitting/standing  Tub/ Shower Transfer:  (not assessed) Tub/Shower Transfer Details (indicate cue type and reason): N/A Functional mobility during ADLs: Min guard;Cueing for safety;Cueing for sequencing General ADL Comments: min guard with hand held assist for bedroom mobility for OOB ADLs; encouraged pt to use RW next time to prevent fall risk     Vision Baseline Vision/History: Wears glasses Wears Glasses: At all times (not available during eval) Patient Visual Report: Blurring of vision (slightly blurred vision 2/2 not having glasses here) Vision Assessment?: No apparent visual deficits (needs glasses to fix blurriness)      Pertinent  Vitals/Pain Pain Assessment: 0-10 Pain Score: 7  Pain Location: back Pain Descriptors / Indicators: Aching Pain Intervention(s): Monitored during session;Repositioned;Patient requesting pain meds-RN notified;Relaxation     Hand Dominance Right   Extremity/Trunk Assessment Upper Extremity Assessment Upper Extremity Assessment: Overall WFL for tasks assessed   Lower Extremity Assessment Lower Extremity Assessment: Overall WFL for tasks assessed   Cervical / Trunk Assessment Cervical / Trunk Assessment: Normal   Communication Communication Communication: No difficulties   Cognition Arousal/Alertness: Awake/alert Behavior During Therapy: Flat affect;Impulsive (anxious) Overall Cognitive Status: Impaired/Different from baseline Area of Impairment: Safety/judgement;Awareness;Problem solving;Following commands;Attention     Current Attention Level: Selective   Following Commands: Follows one step commands inconsistently;Follows one step commands with increased time Safety/Judgement: Decreased awareness of safety;Decreased awareness of deficits Awareness: Intellectual Problem Solving: Slow processing;Requires verbal cues;Requires tactile cues General Comments: pt requires mod-max cues for safety, encouraged pt to use RW for support   General Comments  pt slightly anxious and confused, impulsive and does not understand severity of deficits, mod cues for safety/sequencing; skin intact     Home Living Family/patient expects to be discharged to:: Private residence Living Arrangements: Non-relatives/Friends Available Help at Discharge: Friend(s);Other (Comment) (aide) Type of Home: Apartment (townhouse) Home Access: Level entry     Home Layout: Two level;Other (Comment) (able to live on 1st floor, made family room into bedroom ) Alternate Level Stairs-Number of Steps: 2 flights ~20 steps to second floor where main bedroom/bathroom is located Alternate Level Stairs-Rails:  Left Bathroom Shower/Tub: Teacher, early years/pre: Standard Bathroom Accessibility: Yes How Accessible: Accessible via walker Home Equipment: Shower seat;Walker - 2 wheels;Bedside commode;Grab bars - tub/shower;Grab bars - toilet   Additional Comments: pt prefers to ambulate without RW however pt is unsteady; educated pt on fall prevention and importance of using RW for stability  Prior Functioning/Environment Level of Independence: Needs assistance  Gait / Transfers Assistance Needed: had falls but walked without AD ADL's / Homemaking Assistance Needed: aide for housework and self care; few times a week, cleaning/cooking/showering   Comments: likes to swim in his friend's pool        OT Problem List: Decreased activity tolerance;Impaired balance (sitting and/or standing);Decreased cognition;Decreased safety awareness;Decreased knowledge of use of DME or AE;Pain      OT Treatment/Interventions: Self-care/ADL training;Therapeutic exercise;Neuromuscular education;Energy conservation;DME and/or AE instruction;Therapeutic activities;Cognitive remediation/compensation;Patient/family education;Balance training    OT Goals(Current goals can be found in the care plan section) Acute Rehab OT Goals Patient Stated Goal: to go home OT Goal Formulation: With patient Time For Goal Achievement: 02/26/20 Potential to Achieve Goals: Good  OT Frequency: Min 2X/week   Barriers to D/C: Decreased caregiver support  pt unsafe cognitively and physically; pt is impulsive and unsteady    AM-PAC OT "6 Clicks" Daily Activity     Outcome Measure Help from another person eating meals?: None Help from another person taking care of personal grooming?: A Little Help from another person toileting, which includes using toliet, bedpan, or urinal?: A Little Help from another person bathing (including washing, rinsing, drying)?: A Little Help from another person to put on and taking off regular  upper body clothing?: A Little Help from another person to put on and taking off regular lower body clothing?: A Little 6 Click Score: 19   End of Session Equipment Utilized During Treatment: Gait belt Nurse Communication: Mobility status  Activity Tolerance: Patient tolerated treatment well Patient left: in bed;with call bell/phone within reach;with bed alarm set  OT Visit Diagnosis: Unsteadiness on feet (R26.81);Other abnormalities of gait and mobility (R26.89);History of falling (Z91.81);Pain Pain - Right/Left:  (back) Pain - part of body:  (back)                Time: 1245-8099 OT Time Calculation (min): 41 min Charges:  OT General Charges $OT Visit: 1 Visit OT Evaluation $OT Eval Moderate Complexity: 1 Mod OT Treatments $Self Care/Home Management : 8-22 mins  Michel Bickers, OTR/L [facility] [phone]  Francesca Jewett 02/12/2020, 11:19 AM

## 2020-02-12 NOTE — Progress Notes (Signed)
Inpatient Rehab Admissions Coordinator Note:   Per therapy recommendations, pt was screened for CIR candidacy by Clemens Catholic, [place] CCC-SLP. Pt. Does not demonstrate medical necessity for CIR admit and is currently ambulating with min guard to min A. I will not pursue CIR consult at this time. Please contact me with any questions.   Clemens Catholic, [place], [place] Admissions Coordinator  [phone] ([place]) [phone] (office)

## 2020-02-12 NOTE — Progress Notes (Signed)
DISCHARGE NOTE HOME [name] to be discharged Home per MD order. Discussed prescriptions and follow up appointments with the patient.  Medication list explained in detail. Patient verbalized understanding.  Skin clean, dry and intact without evidence of skin break down, no evidence of skin tears noted. IV catheter discontinued intact. Site without signs and symptoms of complications. Dressing and pressure applied. Pt denies pain at the site currently. No complaints noted.  Patient free of lines, drains, and wounds.   An After Visit Summary (AVS) was printed and given to the patient. Patient escorted via wheelchair, and discharged home via private auto.  Vira Agar, RN

## 2020-02-14 ENCOUNTER — Telehealth: Payer: Self-pay

## 2020-02-14 NOTE — Telephone Encounter (Signed)
Transition Care Management Follow-up Telephone Call  Date of discharge and from where: [facility]/12/2020 [facility]  How have you been since you were released from the hospital? [facility], feeling a little bit down  Any questions or concerns? No  Items Reviewed:  Did the pt receive and understand the discharge instructions provided? Yes   Medications obtained and verified? Yes   Other? No   Any new allergies since your discharge? No   Dietary orders reviewed? Yes Do you have support at home? Yes  has friend that checks in [place] and Equipment/Supplies: Were home health services ordered? yes If so, what is the name of the agency? Doesn't know name  Has the agency set up a time to come to the patient's home? no Were any new equipment or medical supplies ordered?  No What is the name of the medical supply agency? n/a Were you able to get the supplies/equipment? not applicable Do you have any questions related to the use of the equipment or supplies? No  Functional Questionnaire: (I = Independent and D = Dependent) ADLs: I  Bathing/Dressing- I  Meal Prep- I  Eating- I  Maintaining continence- I  Transferring/Ambulation- I  Managing Meds- I  Follow up appointments reviewed:   PCP Hospital f/u appt confirmed? No  office to call to make appointment  Are transportation arrangements needed? No   If their condition worsens, is the pt aware to call PCP or go to the Emergency Dept.? Yes  Was the patient provided with contact information for the PCP's office or ED? Yes  Was to pt encouraged to call back with questions or concerns? Yes

## 2020-02-15 ENCOUNTER — Ambulatory Visit: Payer: Medicare Other

## 2020-02-15 DIAGNOSIS — E1121 Type 2 diabetes mellitus with diabetic nephropathy: Secondary | ICD-10-CM

## 2020-02-15 DIAGNOSIS — N1831 Chronic kidney disease, stage 3a: Secondary | ICD-10-CM

## 2020-02-15 NOTE — Chronic Care Management (AMB) (Signed)
Chronic Care Management    Social Work Follow Up Note  02/15/2020 Name: [name] MRN: [id number] DOB: 11/25/1955  [name] is a 64 y.o. year old male who is a primary care patient of [name], [place]. The CCM team was consulted for assistance with care coordination.   Review of patient status, including review of consultants reports, other relevant assessments, and collaboration with appropriate care team members and the patient's provider was performed as part of comprehensive patient evaluation and provision of chronic care management services.    SDOH (Social Determinants of Health) assessments performed: No    Outpatient Encounter Medications as of 02/15/2020  Medication Sig Note   acetaminophen (TYLENOL) 325 MG tablet Take 2 tablets (650 mg total) by mouth every 6 (six) hours as needed for mild pain (or Fever >/= 101).    alfuzosin (UROXATRAL) 10 MG 24 hr tablet Take 10 mg by mouth daily with breakfast.     aspirin EC 81 MG tablet Take 81 mg by mouth daily with breakfast.     atorvastatin (LIPITOR) 20 MG tablet TAKE 1 TABLET BY MOUTH EVERY DAY (Patient taking differently: Take 20 mg by mouth daily with breakfast. )    guaiFENesin (MUCINEX) 600 MG 12 hr tablet Take 1 tablet (600 mg total) by mouth 2 (two) times daily.    metoprolol tartrate (LOPRESSOR) 50 MG tablet TAKE 1 TABLET BY MOUTH EVERY DAY (Patient taking differently: Take 50 mg by mouth daily with breakfast. (BETA BLOCKER))    Multiple Vitamin (MULTIVITAMIN WITH MINERALS) TABS tablet Take 1 tablet by mouth daily with breakfast.    MYRBETRIQ 25 MG TB24 tablet TAKE 1 TABLET BY MOUTH EVERY DAY (Patient taking differently: Take 25 mg by mouth daily with breakfast. )    nystatin (NYSTATIN) powder Apply 1 application topically 3 (three) times daily. (Patient taking differently: Apply 1 application topically 2 (two) times daily as needed (itching/rash). )    RYBELSUS 14 MG TABS TAKE 1 TABLET BY MOUTH DAILY. [place] (Patient taking differently: Take 14 mg by mouth daily before breakfast. 30 minutes before breakfast)    sildenafil (REVATIO) 20 MG tablet TAKE 1 TABLET BY MOUTH EVERY DAY (PRIOR AUTHORIZATION DENIED) (Patient taking differently: Take 20 mg by mouth daily as needed (erectile dysfunction). )    Tetrahydrozoline HCl (VISINE OP) Place 1 drop into both eyes daily as needed (itching/irritation).    TRADJENTA 5 MG TABS tablet TAKE 1 TABLET BY MOUTH EVERY DAY (Patient taking differently: Take 5 mg by mouth daily with breakfast. )    traMADol (ULTRAM) 50 MG tablet Take 1 tablet (50 mg total) by mouth every 6 (six) hours as needed for severe pain. 02/10/2020: #20 filled 10/07/2019   traZODone (DESYREL) 50 MG tablet Take 1 tablet (50 mg total) by mouth at bedtime as needed for sleep.    No facility-administered encounter medications on file as of 02/15/2020.     Goals Addressed              This Visit's Progress     Patient Stated     "I need help getting into an assisted living facility" (pt-stated)        CARE PLAN ENTRY (see longitudinal plan of care for additional care plan information)  Current Barriers:   Knowledge Deficits related to process for admission into an ALF  Chronic Disease Management support and education needs related to CKD III, DM II, HTN  Nurse Case Manager Clinical  Goal(s):   Over the next 90 days, patient will work with embedded BSW and PCP to address needs related to assistance for admission into an ALF  CCM SW Interventions: Completed 02/15/20  Successful outbound call placed to the patient to assist with care coordination needs  Discussed the patient continues to live in his apartment and is "not sure" he is ready for placement at this time  The patient requests SW outreach sometime in January to review patient care needs  Performed chart review to note recent admission - patient reports plans to follow up with urology over the  next several weeks o Patient denies transportation resource needs at this time - patient approved for [facility]   Scheduled follow up call over the next 60 days  Completed 12/07/19  Successful outbound call placed to the patient to assess goal progression  Determined the patient has yet to complete or scheduled a tour at [facility] o "I may go later next week"  Assessed for barriers to tour completion; no barriers indicated at this time  Discussed the patient remains on the wait list for [facility] which is his top choice  Scheduled follow up call over the next month; SW will plan to close goal during next call if the patient has not yet completed the tour due to inability to progress   Completed 11/23/19  Successful outbound call placed to the patient to assess goal progression  Determined the patient has yet to [place] but states he "hopes to go Thursday"  Scheduled follow up call over the next two weeks  Advised patient to contact SW directly as needed prior to next scheduled call  Completed 11/10/19  Successful outbound call placed to the patient to assess goal progression  Determined the patient has been in contact with [facility] and was informed he may enter the facility to complete a tour o The patient reports his back is painful this week bu the hopes to visit the community over the next week to complete the tour  Scheduled follow up call over the next two weeks to assess goal progression  Completed 11/03/19  Successful outbound call placed to the patient to advise no other bed offers have been made at this time  Discussed opportunity to stay in the home and await a bed a [facility], which the patient previously identified as his top choice o The patient reports he is not sure if he would like to wait on placement at [facility] patient indicates he is interested in [facility]  Successful outbound call placed to [place] with [facility] Schmid who confirms [facility] beds are still available o Unfortunately, tours are not permitted at this time in person but [name] reports she is happy to participate in a zoom call o Provided [name] with the patients contact number to discuss a zoom tour  Scheduled follow up call over the next week to assess goal progression  Completed 10/29/19  Collaboration with patient primary provider who reports one dose of Moderna is approximately 80% effective and recommendation for patient to receive second Moderna dose of COVID 19 vaccine to better protect the patient prior to placement  Successful outbound call placed to the patient to relay information including approximate efficacy and recommendation for the patient to receive the second dose prior to placement  Determined the patient is not considering a second dose  at this time due to side effects from first vaccine and the concern that two doses are not 100% effective in preventing COVID 19 infection o SW discussed the patient's reaction may have been due to vaccine and/or the COVID 19 infection the patient contracted shortly after vaccination o Advised the patient that due to plans to move into an ALF, risk of infection would be higher in communal living due to inability to social distance  Advised the patient SW is unclear if an ALF will allow placement with only one dose of the vaccine o Patient reported understanding and still reports plan to decline second dose at this time  Discussed the patient still only has two current bed offers from [facility] and [facility] o The patient is choosing to decline both bed offers due to star rating and neighborhood facilities are located in o The patient reports he is on a waiting list for [facility] which is his first choice  Scheduled follow up call over the next week to reach  out to communities to confirm receipt of Fl2 and inquire bed availability  Collaboration with [name], [name] and [place] to inform on plan  Completed 10/27/19  Successful outbound call placed to the patient to review bed offers  Discussed patient has received a bed offer at both [facility] and [facility] the patient is interested in contacting [place] to request more information about their community o Provided the patient with contact information  Discussed the patient has yet to receive second dose of COVID vaccine and is not sure he will based on receiving one dose and recovering from [place] he feels a second dose is not needed o Advised the patient SW is unable to confirm placement will be available without him being fully vaccinated o Collaboration with [name], [name] for further guidance  Collaboration with [place] to discuss patient current bed offers o Discussed concern surrounding star ratings of both facilities  Successful outbound call placed to the patient to review star ratings and deficiencies on last county monitoring visit o Determined the patient prefers to move into an ALF that is 3 stars or greater o The patient reports he is currently on the wait list at [facility] patient states he is not interested in [facility] due to neighborhood the community is in  SW will follow up with the patient over the next week  CCM RN CM Interventions:   Inter-disciplinary care team collaboration (see longitudinal plan of care)  Determined patient would like to move into an ALF and would like assistance from the embedded BSW and PCP  Collaborated with embedded BSW [name] regarding patient's request for assistance with admission into an ALF  Discussed plans with patient for ongoing care management follow up and provided patient with direct contact information for  care management team  Patient Self Care Activities:   Self administers medications as prescribed  Attends all scheduled provider appointments  Calls pharmacy for medication refills  Calls provider office for new concerns or questions  Please see past updates related to this goal by clicking on the "Past Updates" button in the selected goal          Follow Up Plan: SW will follow up with patient by phone over the next 60 days.   [name], BSW, CDP Social Worker, Certified Dementia Practitioner [place] [facility] [place]  Management [phone]  Total time spent performing care coordination and/or care management activities with the patient by phone or face to face = 15 minutes.

## 2020-02-15 NOTE — Patient Instructions (Signed)
Social Worker Visit Information  Goals we discussed today:  Goals Addressed              This Visit's Progress     Patient Stated   .  "I need help getting into an assisted living facility" (pt-stated)        [facility] (see longitudinal plan of care for additional care plan information)  Current Barriers:  Marland Kitchen Knowledge Deficits related to process for admission into an ALF . Chronic Disease Management support and education needs related to CKD III, DM II, HTN  Nurse Case Manager Clinical Goal(s):  Marland Kitchen Over the next 90 days, patient will work with embedded BSW and PCP to address needs related to assistance for admission into an ALF  CCM SW Interventions: Completed 02/15/20 . Successful outbound call placed to the patient to assist with care coordination needs . Discussed the patient continues to live in his apartment and is "not sure" he is ready for placement at this time . The patient requests SW outreach sometime in January to review patient care needs . Performed chart review to note recent admission - patient reports plans to follow up with urology over the next several weeks o Patient denies transportation resource needs at this time - patient approved for [facility]  . Scheduled follow up call over the next 60 days  Completed 12/07/19 . Successful outbound call placed to the patient to assess goal progression . Determined the patient has yet to complete or scheduled a tour at [facility] o "I may go later next week" . Assessed for barriers to tour completion; no barriers indicated at this time . Discussed the patient remains on the wait list for [facility] which is his top choice . Scheduled follow up call over the next month; SW will plan to close goal during next call if the patient has not yet completed the tour due to inability to progress   Completed 11/23/19 . Successful outbound call placed to the patient to assess goal progression . Determined the  patient has yet to [facility] but states he "hopes to go Thursday" . Scheduled follow up call over the next two weeks . Advised patient to contact SW directly as needed prior to next scheduled call  Completed 11/10/19 . Successful outbound call placed to the patient to assess goal progression . Determined the patient has been in contact with [facility] and was informed he may enter the facility to complete a tour o The patient reports his back is painful this week bu the hopes to visit the community over the next week to complete the tour . Scheduled follow up call over the next two weeks to assess goal progression  Completed 11/03/19 . Successful outbound call placed to the patient to advise no other bed offers have been made at this time . Discussed opportunity to stay in the home and await a bed a [facility], which the patient previously identified as his top choice o The patient reports he is not sure if he would like to wait on placement at [place] patient indicates he is interested in [place] . Successful outbound call placed to [place] with [facility] Sch[facility] who confirms [facility] beds are still available o Unfortunately, tours are not permitted at this time in person but [name] reports she is happy to participate in a zoom call o Provided  [name] with the patients contact number to discuss a zoom tour . Scheduled follow up call over the next week to assess goal progression  Completed 10/29/19 . Collaboration with patient primary provider who reports one dose of Moderna is approximately 80% effective and recommendation for patient to receive second Moderna dose of COVID 19 vaccine to better protect the patient prior to placement . Successful outbound call placed to the patient to relay information including approximate efficacy and recommendation for the patient to receive the  second dose prior to placement . Determined the patient is not considering a second dose at this time due to side effects from first vaccine and the concern that two doses are not 100% effective in preventing COVID 19 infection o SW discussed the patient's reaction may have been due to vaccine and/or the COVID 19 infection the patient contracted shortly after vaccination o Advised the patient that due to plans to move into an ALF, risk of infection would be higher in communal living due to inability to social distance . Advised the patient SW is unclear if an ALF will allow placement with only one dose of the vaccine o Patient reported understanding and still reports plan to decline second dose at this time . Discussed the patient still only has two current bed offers from [facility] and [facility] o The patient is choosing to decline both bed offers due to star rating and neighborhood facilities are located in o The patient reports he is on a waiting list for [facility] which is his first choice . Scheduled follow up call over the next week to reach out to communities to confirm receipt of [facility] and inquire bed availability . Collaboration with [name], [name] and [place] to inform on plan  Completed 10/27/19 . Successful outbound call placed to the patient to review bed offers . Discussed patient has received a bed offer at both [facility] and [facility] . Determined the patient is interested in contacting [place] to request more information about their community o Provided the patient with contact information . Discussed the patient has yet to receive second dose of COVID vaccine and is not sure he will based on receiving one dose and recovering from [place] he feels a second dose is not needed o Advised the patient SW is unable to confirm placement will be available without him being fully  vaccinated o Collaboration with [name], [name] for further guidance . Collaboration with RN Care Manager [place] to discuss patient current bed offers o Discussed concern surrounding star ratings of both facilities . Successful outbound call placed to the patient to review star ratings and deficiencies on last county monitoring visit o Determined the patient prefers to move into an ALF that is 3 stars or greater o The patient reports he is currently on the wait list at [facility] patient states he is not interested in [facility] due to neighborhood the community is in . SW will follow up with the patient over the next week  CCM RN CM Interventions:  . Inter-disciplinary care team collaboration (see longitudinal plan of care) . Determined patient would like to move into an ALF and would like assistance from the embedded BSW and PCP . Collaborated with embedded BSW [name] regarding patient's request for assistance with admission into an ALF . Discussed plans with patient for ongoing care management follow up and provided  patient with direct contact information for care management team  Patient Self Care Activities:  . Self administers medications as prescribed . Attends all scheduled provider appointments . Calls pharmacy for medication refills . Calls provider office for new concerns or questions  Please see past updates related to this goal by clicking on the "Past Updates" button in the selected goal          Follow Up Plan: SW will follow up with patient by phone over the next 60 days.   [name], BSW, CDP Social Worker, Certified Dementia Practitioner [place] [facility] [phone]

## 2020-02-16 DIAGNOSIS — N1330 Unspecified hydronephrosis: Secondary | ICD-10-CM | POA: Diagnosis not present

## 2020-02-16 DIAGNOSIS — E1122 Type 2 diabetes mellitus with diabetic chronic kidney disease: Secondary | ICD-10-CM | POA: Diagnosis not present

## 2020-02-16 DIAGNOSIS — M5459 Other low back pain: Secondary | ICD-10-CM | POA: Diagnosis not present

## 2020-02-16 DIAGNOSIS — M6281 Muscle weakness (generalized): Secondary | ICD-10-CM | POA: Diagnosis not present

## 2020-02-16 DIAGNOSIS — E785 Hyperlipidemia, unspecified: Secondary | ICD-10-CM | POA: Diagnosis not present

## 2020-02-16 DIAGNOSIS — S37009D Unspecified injury of unspecified kidney, subsequent encounter: Secondary | ICD-10-CM

## 2020-02-16 DIAGNOSIS — Z9181 History of falling: Secondary | ICD-10-CM | POA: Diagnosis not present

## 2020-02-16 DIAGNOSIS — R262 Difficulty in walking, not elsewhere classified: Secondary | ICD-10-CM | POA: Diagnosis not present

## 2020-02-16 DIAGNOSIS — N183 Chronic kidney disease, stage 3 unspecified: Secondary | ICD-10-CM | POA: Diagnosis not present

## 2020-02-16 DIAGNOSIS — E119 Type 2 diabetes mellitus without complications: Secondary | ICD-10-CM | POA: Diagnosis not present

## 2020-02-21 DIAGNOSIS — E119 Type 2 diabetes mellitus without complications: Secondary | ICD-10-CM | POA: Diagnosis not present

## 2020-02-21 DIAGNOSIS — Z9181 History of falling: Secondary | ICD-10-CM | POA: Diagnosis not present

## 2020-02-21 DIAGNOSIS — M6281 Muscle weakness (generalized): Secondary | ICD-10-CM | POA: Diagnosis not present

## 2020-02-21 DIAGNOSIS — N183 Chronic kidney disease, stage 3 unspecified: Secondary | ICD-10-CM | POA: Diagnosis not present

## 2020-02-21 DIAGNOSIS — M5459 Other low back pain: Secondary | ICD-10-CM | POA: Diagnosis not present

## 2020-02-21 DIAGNOSIS — R262 Difficulty in walking, not elsewhere classified: Secondary | ICD-10-CM | POA: Diagnosis not present

## 2020-02-21 DIAGNOSIS — S37009D Unspecified injury of unspecified kidney, subsequent encounter: Secondary | ICD-10-CM | POA: Diagnosis not present

## 2020-02-21 DIAGNOSIS — E785 Hyperlipidemia, unspecified: Secondary | ICD-10-CM | POA: Diagnosis not present

## 2020-02-21 DIAGNOSIS — N1330 Unspecified hydronephrosis: Secondary | ICD-10-CM | POA: Diagnosis not present

## 2020-02-22 ENCOUNTER — Telehealth: Payer: Medicare Other

## 2020-02-22 ENCOUNTER — Telehealth: Payer: Self-pay

## 2020-02-22 NOTE — Telephone Encounter (Cosign Needed)
  Chronic Care Management   Outreach Note  02/22/2020 Name: [name] MRN: [id number] DOB: 24-Nov-1955  Referred by: [name], [name] Reason for referral : Chronic Care Management (RNCM FU Call )   ALEXAVIER TSUTSUI is enrolled in a Managed Medicaid Health Plan: No  Third unsuccessful telephone outreach was attempted today. The patient was referred to the case management team for assistance with care management and care coordination. The patient's primary care provider has been notified of our unsuccessful attempts to make or maintain contact with the patient. The care management team is pleased to engage with this patient at any time in the future should he/she be interested in assistance from the care management team.   Follow Up Plan: A HIPAA compliant phone message was left for the patient providing contact information and requesting a return call.   Telephone follow up appointment with care management embedded BSW scheduled for: 03/30/20  Barb Merino, RN, BSN, CCM Care Management Coordinator [place] [facility]  Direct Phone: [phone]

## 2020-02-23 DIAGNOSIS — R262 Difficulty in walking, not elsewhere classified: Secondary | ICD-10-CM | POA: Diagnosis not present

## 2020-02-23 DIAGNOSIS — M6281 Muscle weakness (generalized): Secondary | ICD-10-CM | POA: Diagnosis not present

## 2020-02-23 DIAGNOSIS — E119 Type 2 diabetes mellitus without complications: Secondary | ICD-10-CM | POA: Diagnosis not present

## 2020-02-23 DIAGNOSIS — N183 Chronic kidney disease, stage 3 unspecified: Secondary | ICD-10-CM | POA: Diagnosis not present

## 2020-02-23 DIAGNOSIS — S37009D Unspecified injury of unspecified kidney, subsequent encounter: Secondary | ICD-10-CM | POA: Diagnosis not present

## 2020-02-23 DIAGNOSIS — M5459 Other low back pain: Secondary | ICD-10-CM | POA: Diagnosis not present

## 2020-02-23 DIAGNOSIS — Z9181 History of falling: Secondary | ICD-10-CM | POA: Diagnosis not present

## 2020-02-23 DIAGNOSIS — E785 Hyperlipidemia, unspecified: Secondary | ICD-10-CM | POA: Diagnosis not present

## 2020-02-23 DIAGNOSIS — N1330 Unspecified hydronephrosis: Secondary | ICD-10-CM | POA: Diagnosis not present

## 2020-02-24 ENCOUNTER — Other Ambulatory Visit: Payer: Self-pay | Admitting: Nurse Practitioner

## 2020-02-28 DIAGNOSIS — R262 Difficulty in walking, not elsewhere classified: Secondary | ICD-10-CM | POA: Diagnosis not present

## 2020-02-28 DIAGNOSIS — E119 Type 2 diabetes mellitus without complications: Secondary | ICD-10-CM | POA: Diagnosis not present

## 2020-02-28 DIAGNOSIS — Z9181 History of falling: Secondary | ICD-10-CM | POA: Diagnosis not present

## 2020-02-28 DIAGNOSIS — M6281 Muscle weakness (generalized): Secondary | ICD-10-CM | POA: Diagnosis not present

## 2020-02-28 DIAGNOSIS — N1330 Unspecified hydronephrosis: Secondary | ICD-10-CM | POA: Diagnosis not present

## 2020-02-28 DIAGNOSIS — M5459 Other low back pain: Secondary | ICD-10-CM | POA: Diagnosis not present

## 2020-02-28 DIAGNOSIS — S37009D Unspecified injury of unspecified kidney, subsequent encounter: Secondary | ICD-10-CM | POA: Diagnosis not present

## 2020-02-28 DIAGNOSIS — N183 Chronic kidney disease, stage 3 unspecified: Secondary | ICD-10-CM | POA: Diagnosis not present

## 2020-02-28 DIAGNOSIS — E785 Hyperlipidemia, unspecified: Secondary | ICD-10-CM | POA: Diagnosis not present

## 2020-02-29 DIAGNOSIS — M5459 Other low back pain: Secondary | ICD-10-CM | POA: Diagnosis not present

## 2020-02-29 DIAGNOSIS — Z9181 History of falling: Secondary | ICD-10-CM | POA: Diagnosis not present

## 2020-02-29 DIAGNOSIS — N1330 Unspecified hydronephrosis: Secondary | ICD-10-CM | POA: Diagnosis not present

## 2020-02-29 DIAGNOSIS — E119 Type 2 diabetes mellitus without complications: Secondary | ICD-10-CM | POA: Diagnosis not present

## 2020-02-29 DIAGNOSIS — S37009D Unspecified injury of unspecified kidney, subsequent encounter: Secondary | ICD-10-CM | POA: Diagnosis not present

## 2020-02-29 DIAGNOSIS — R262 Difficulty in walking, not elsewhere classified: Secondary | ICD-10-CM | POA: Diagnosis not present

## 2020-02-29 DIAGNOSIS — N183 Chronic kidney disease, stage 3 unspecified: Secondary | ICD-10-CM | POA: Diagnosis not present

## 2020-02-29 DIAGNOSIS — E785 Hyperlipidemia, unspecified: Secondary | ICD-10-CM | POA: Diagnosis not present

## 2020-02-29 DIAGNOSIS — M6281 Muscle weakness (generalized): Secondary | ICD-10-CM | POA: Diagnosis not present

## 2020-03-01 DIAGNOSIS — E119 Type 2 diabetes mellitus without complications: Secondary | ICD-10-CM | POA: Diagnosis not present

## 2020-03-01 DIAGNOSIS — Z9181 History of falling: Secondary | ICD-10-CM | POA: Diagnosis not present

## 2020-03-01 DIAGNOSIS — N1330 Unspecified hydronephrosis: Secondary | ICD-10-CM | POA: Diagnosis not present

## 2020-03-01 DIAGNOSIS — M5459 Other low back pain: Secondary | ICD-10-CM | POA: Diagnosis not present

## 2020-03-01 DIAGNOSIS — N183 Chronic kidney disease, stage 3 unspecified: Secondary | ICD-10-CM | POA: Diagnosis not present

## 2020-03-01 DIAGNOSIS — R262 Difficulty in walking, not elsewhere classified: Secondary | ICD-10-CM | POA: Diagnosis not present

## 2020-03-01 DIAGNOSIS — E785 Hyperlipidemia, unspecified: Secondary | ICD-10-CM | POA: Diagnosis not present

## 2020-03-01 DIAGNOSIS — N130 Hydronephrosis with ureteropelvic junction obstruction: Secondary | ICD-10-CM | POA: Diagnosis not present

## 2020-03-01 DIAGNOSIS — R3911 Hesitancy of micturition: Secondary | ICD-10-CM | POA: Diagnosis not present

## 2020-03-01 DIAGNOSIS — M6281 Muscle weakness (generalized): Secondary | ICD-10-CM | POA: Diagnosis not present

## 2020-03-01 DIAGNOSIS — S37009D Unspecified injury of unspecified kidney, subsequent encounter: Secondary | ICD-10-CM | POA: Diagnosis not present

## 2020-03-07 ENCOUNTER — Telehealth: Payer: Self-pay

## 2020-03-07 DIAGNOSIS — M5459 Other low back pain: Secondary | ICD-10-CM | POA: Diagnosis not present

## 2020-03-07 DIAGNOSIS — E119 Type 2 diabetes mellitus without complications: Secondary | ICD-10-CM | POA: Diagnosis not present

## 2020-03-07 DIAGNOSIS — Z9181 History of falling: Secondary | ICD-10-CM | POA: Diagnosis not present

## 2020-03-07 DIAGNOSIS — E785 Hyperlipidemia, unspecified: Secondary | ICD-10-CM | POA: Diagnosis not present

## 2020-03-07 DIAGNOSIS — R262 Difficulty in walking, not elsewhere classified: Secondary | ICD-10-CM | POA: Diagnosis not present

## 2020-03-07 DIAGNOSIS — N1330 Unspecified hydronephrosis: Secondary | ICD-10-CM | POA: Diagnosis not present

## 2020-03-07 DIAGNOSIS — N183 Chronic kidney disease, stage 3 unspecified: Secondary | ICD-10-CM | POA: Diagnosis not present

## 2020-03-07 DIAGNOSIS — M6281 Muscle weakness (generalized): Secondary | ICD-10-CM | POA: Diagnosis not present

## 2020-03-07 DIAGNOSIS — S37009D Unspecified injury of unspecified kidney, subsequent encounter: Secondary | ICD-10-CM | POA: Diagnosis not present

## 2020-03-07 NOTE — Chronic Care Management (AMB) (Signed)
Chronic Care Management Pharmacy Assistant   Name: [name]  MRN: [id number] DOB: Sep 03, 1955  Reason for Encounter: Disease State Hypertension, Diabetes and Lipid Adherence Call.  Patient Questions:  1.  Have you seen any other providers since your last visit?  Yes. 01/05/2020 / 1110/2021  -  Randy Kindle, MD (Urology)- Procedure Visit. 02/10/2020 ED to Hospital Admission - Dehydration - Acute kidney injury superimposed on chronic kidney disease.               2.  Any changes in your medicines or health? N0 .  PCP : Randy Felts, [name]  Allergies:  No Known Allergies  Medications: Outpatient Encounter Medications as of 03/07/2020  Medication Sig Note  . acetaminophen (TYLENOL) 325 MG tablet Take 2 tablets (650 mg total) by mouth every 6 (six) hours as needed for mild pain (or Fever >/= 101).   Marland Kitchen alfuzosin (UROXATRAL) 10 MG 24 hr tablet Take 10 mg by mouth daily with breakfast.    . aspirin EC 81 MG tablet Take 81 mg by mouth daily with breakfast.    . atorvastatin (LIPITOR) 20 MG tablet TAKE 1 TABLET BY MOUTH EVERY DAY (Patient taking differently: Take 20 mg by mouth daily with breakfast. )   . guaiFENesin (MUCINEX) 600 MG 12 hr tablet Take 1 tablet (600 mg total) by mouth 2 (two) times daily.   . metoprolol tartrate (LOPRESSOR) 50 MG tablet TAKE 1 TABLET BY MOUTH EVERY DAY (Patient taking differently: Take 50 mg by mouth daily with breakfast. (BETA BLOCKER))   . Multiple Vitamin (MULTIVITAMIN WITH MINERALS) TABS tablet Take 1 tablet by mouth daily with breakfast.   . MYRBETRIQ 25 MG TB24 tablet TAKE 1 TABLET BY MOUTH EVERY DAY (Patient taking differently: Take 25 mg by mouth daily with breakfast. )   . nystatin (NYSTATIN) powder Apply 1 application topically 3 (three) times daily. (Patient taking differently: Apply 1 application topically 2 (two) times daily as needed (itching/rash). )   . RYBELSUS 14 MG TABS TAKE 1 TABLET BY MOUTH DAILY. 30 MINUTES BEFORE BREAKFAST  (Patient taking differently: Take 14 mg by mouth daily before breakfast. 30 minutes before breakfast)   . sildenafil (REVATIO) 20 MG tablet TAKE 1 TABLET BY MOUTH EVERY DAY (PRIOR AUTHORIZATION DENIED) (Patient taking differently: Take 20 mg by mouth daily as needed (erectile dysfunction). )   . Tetrahydrozoline HCl (VISINE OP) Place 1 drop into both eyes daily as needed (itching/irritation).   . TRADJENTA 5 MG TABS tablet TAKE 1 TABLET BY MOUTH EVERY DAY (Patient taking differently: Take 5 mg by mouth daily with breakfast. )   . traMADol (ULTRAM) 50 MG tablet Take 1 tablet (50 mg total) by mouth every 6 (six) hours as needed for severe pain. 02/10/2020: #20 filled 10/07/2019  . traZODone (DESYREL) 50 MG tablet Take 1 tablet (50 mg total) by mouth at bedtime as needed for sleep.    No facility-administered encounter medications on file as of 03/07/2020.    Current Diagnosis: Patient Active Problem List   Diagnosis Date Noted  . AKI (acute kidney injury) (HCC) 02/11/2020  . Acute kidney injury superimposed on chronic kidney disease (HCC) 02/10/2020  . Hydroureteronephrosis 02/10/2020  . Nausea and vomiting 02/10/2020  . History of COVID-19 02/10/2020  . Pneumonia due to COVID-19 virus 09/29/2019  . Acute on chronic kidney failure (HCC) 09/28/2019  . Hyponatremia 09/28/2019  . Sinus tachycardia 09/28/2019  . Insomnia 08/26/2018  . Nephropathy 03/26/2018  . Urinary urgency  03/26/2018  . Type 2 diabetes mellitus with diabetic nephropathy, without long-term current use of insulin ([place]) 03/26/2018  . Essential hypertension 03/26/2018  . Chronic kidney disease (CKD), stage III (moderate) ([place]) 03/26/2018  . Conductive hearing loss, bilateral 11/07/2017  . Uncontrolled type 2 diabetes mellitus with hyperglycemia ([place]) 07/31/2016  . Dyslipidemia associated with type 2 diabetes mellitus ([place]) 05/29/2016  . Microcytic anemia 02/17/2012  . 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 Benign hypertension  02/17/2012  . Depressive disorder 02/17/2012    . Current antihyperglycemic regimen:  Rybelsus 14 mg daily 30 minutes before breakfast Tradjenta 5 mg daily . What recent interventions/DTPs have been made to improve glycemic control:  o Patient states he has been taking his medications as directed by provider  . Have there been any recent hospitalizations or ED visits since last visit with CPP? Yes  . Patient denies hypoglycemic symptoms, including Pale, Sweaty, Shaky, Hungry, Nervous/irritable and Vision changes . Patient denies hyperglycemic symptoms, including blurry vision, excessive thirst, fatigue, polyuria and weakness  . How often are you checking your blood sugar? Twice a week  . What are your blood sugars ranging? Between 140- 180 fasting o Fasting: None o Before meals: None o After meals: None o Bedtime: None . During the week, how often does your blood glucose drop below 70? No   . Are you checking your feet daily/regularly? Yes , no open sores , or numbness, tingling or pain . States that he has an appointment next month with Podiatrists for ingrown toe nails.   Adherence Review: Is the patient currently on a STATIN medication? Yes Is the patient currently on ACE/ARB medication? Yes Does the patient have >5 day gap between last estimated fill dates? No  Reviewed chart prior to disease state call. Spoke with patient regarding BP  Recent Office Vitals: BP Readings from Last 3 Encounters:  02/12/20 (!) 142/88  12/13/19 126/84  10/05/19 120/71   Pulse Readings from Last 3 Encounters:  02/12/20 73  12/13/19 98  10/05/19 66    Wt Readings from Last 3 Encounters:  02/10/20 175 lb 11.3 oz (79.7 kg)  12/13/19 193 lb 6.4 oz (87.7 kg)  10/03/19 176 lb 5.9 oz (80 kg)     Kidney Function Lab Results  Component Value Date/Time   CREATININE 1.41 (H) 02/12/2020 07:46 AM   CREATININE 1.75 (H) 02/11/2020 01:33 AM   GFRNONAA 55 (L) 02/12/2020 07:46 AM   GFRAA 60  12/13/2019 03:13 PM    BMP Latest Ref Rng & Units 02/12/2020 02/11/2020 02/10/2020  Glucose 70 - 99 mg/dL 149(H) 90 157(H)  BUN 8 - 23 mg/dL 23 36(H) 41(H)  Creatinine 0.61 - 1.24 mg/dL 1.41(H) 1.75(H) 2.20(H)  BUN/Creat Ratio 10 - 24 - - -  Sodium 135 - 145 mmol/L 138 137 135  Potassium 3.5 - 5.1 mmol/L 4.1 4.1 4.1  Chloride 98 - 111 mmol/L 107 107 102  CO2 22 - 32 mmol/L 19(L) 20(L) 19(L)  Calcium 8.9 - 10.3 mg/dL 9.5 8.9 9.1    . Current antihypertensive regimen:  Metoprolol tartrate 50 mg daily Olmesartan/ HCTZ 40/25 mg daily  . How often are you checking your Blood Pressure?              Goal - Check BP weekly        Patient states he does check his blood pressure but not every day.  . Current home BP readings: None  . What recent interventions/DTPs have been made by any  provider to improve Blood Pressure control since last CPP Visit: Patient states he has been taking his medications as directed by provider.  . Any recent hospitalizations or ED visits since last visit with CPP? Yes  . What diet changes have been made to improve Blood Pressure Control?  Goals - Ensure daily salt intake < 2300 mg/day / Choose low-sodium/no-sodium canned goods Patient states he has cut back on salt intake /states that he has loss weight - 173 pounds is weight today. . What exercise is being done to improve your Blood Pressure Control?  o Goals - Exercise 30 minutes 5 times weekly  o   Patient states he has been walking about 20 minutes daily.  Adherence Review: Is the patient currently on ACE/ARB medication? Yes  Does the patient have >5 day gap between last estimated fill dates? No . Comprehensive medication review performed; Spoke to patient regarding cholesterol  Lipid Panel    Component Value Date/Time   CHOL 220 (H) 12/13/2019 1536   TRIG 282 (H) 12/13/2019 1536   HDL 37 (L) 12/13/2019 1536   LDLCALC 132 (H) 12/13/2019 1536    10-year ASCVD risk score: The 10-year ASCVD risk  score Mikey Bussing DC Brooke Bonito., et al., 2013) is: 40%   Values used to calculate the score:     Age: 64 years     Sex: Male     Is Non-Hispanic African American: No     Diabetic: Yes     Tobacco smoker: No     Systolic Blood Pressure: [id number] mmHg     Is BP treated: Yes     HDL Cholesterol: 37 mg/dL     Total Cholesterol: 220 mg/dL  . Current antihyperlipidemic regimen:  o Atorvastatin 20 mg one tablet a day.  . Previous antihyperlipidemic medications tried: None  . ASCVD risk enhancing conditions: age >20, DM and HTN   . What recent interventions/DTPs have been made by any provider to improve Cholesterol control since last CPP Visit: Patient states he has been eating healthier..  . Any recent hospitalizations or ED visits since last visit with CPP? Yes  . What diet changes have been made to improve Cholesterol?  o Patient states that he has been watching his fats and he states he has been eating healthier. o  . What exercise is being done to improve Cholesterol?  Patient states that he has been walking about 20 minutes a day.  Adherence Review: Does the patient have >5 day gap between last estimated fill dates? No      Follow-Up:  Pharmacist Review - Patient states that he would like someone to call him to schedule an appointment with Orlando Penner. Patient states, he is still eating pies, not checking his sugars daily and not taking his blood pressure as directed. Patient states he has been doing better , states he has had some weight loss.  Vallie Pearson,CPP . Notified  Judithann Sheen, [place] Pharmacist Assistant [phone]

## 2020-03-10 ENCOUNTER — Other Ambulatory Visit: Payer: Self-pay | Admitting: Nurse Practitioner

## 2020-03-13 ENCOUNTER — Ambulatory Visit (INDEPENDENT_AMBULATORY_CARE_PROVIDER_SITE_OTHER): Payer: Medicare Other | Admitting: Nurse Practitioner

## 2020-03-13 ENCOUNTER — Other Ambulatory Visit: Payer: Self-pay

## 2020-03-13 ENCOUNTER — Encounter: Payer: Self-pay | Admitting: Nurse Practitioner

## 2020-03-13 VITALS — BP 136/74 | HR 82 | Temp 97.6°F | Ht 66.0 in | Wt 181.6 lb

## 2020-03-13 DIAGNOSIS — I129 Hypertensive chronic kidney disease with stage 1 through stage 4 chronic kidney disease, or unspecified chronic kidney disease: Secondary | ICD-10-CM

## 2020-03-13 DIAGNOSIS — C61 Malignant neoplasm of prostate: Secondary | ICD-10-CM

## 2020-03-13 DIAGNOSIS — H3553 Other dystrophies primarily involving the sensory retina: Secondary | ICD-10-CM | POA: Diagnosis not present

## 2020-03-13 DIAGNOSIS — N529 Male erectile dysfunction, unspecified: Secondary | ICD-10-CM

## 2020-03-13 DIAGNOSIS — E1121 Type 2 diabetes mellitus with diabetic nephropathy: Secondary | ICD-10-CM | POA: Diagnosis not present

## 2020-03-13 DIAGNOSIS — Z1211 Encounter for screening for malignant neoplasm of colon: Secondary | ICD-10-CM

## 2020-03-13 DIAGNOSIS — N1832 Chronic kidney disease, stage 3b: Secondary | ICD-10-CM

## 2020-03-13 DIAGNOSIS — I10 Essential (primary) hypertension: Secondary | ICD-10-CM | POA: Diagnosis not present

## 2020-03-13 LAB — BMP8+EGFR
BUN/Creatinine Ratio: 21 (ref 10–24)
BUN: 35 mg/dL — ABNORMAL HIGH (ref 8–27)
CO2: 21 mmol/L (ref 20–29)
Calcium: 10 mg/dL (ref 8.6–10.2)
Chloride: 99 mmol/L (ref 96–106)
Creatinine, Ser: 1.7 mg/dL — ABNORMAL HIGH (ref 0.76–1.27)
GFR calc Af Amer: 48 mL/min/{1.73_m2} — ABNORMAL LOW (ref >59)
GFR calc non Af Amer: 41 mL/min/{1.73_m2} — ABNORMAL LOW (ref >59)
Glucose: 211 mg/dL — ABNORMAL HIGH (ref 65–99)
Potassium: 4.9 mmol/L (ref 3.5–5.2)
Sodium: 137 mmol/L (ref 134–144)

## 2020-03-13 LAB — LIPID PANEL
Chol/HDL Ratio: 5.2 ratio — ABNORMAL HIGH (ref 0.0–5.0)
Cholesterol, Total: 198 mg/dL (ref 100–199)
HDL: 38 mg/dL — ABNORMAL LOW (ref >39)
LDL Chol Calc (NIH): 115 mg/dL — ABNORMAL HIGH (ref 0–99)
Triglycerides: 255 mg/dL — ABNORMAL HIGH (ref 0–149)
VLDL Cholesterol Cal: 45 mg/dL — ABNORMAL HIGH (ref 5–40)

## 2020-03-13 LAB — HEMOGLOBIN A1C
Est. average glucose Bld gHb Est-mCnc: 160 mg/dL
Hgb A1c MFr Bld: 7.2 % — ABNORMAL HIGH (ref 4.8–5.6)

## 2020-03-13 MED ORDER — ATORVASTATIN CALCIUM 20 MG PO TABS: 20.0000 mg | ORAL_TABLET | Freq: Every day | ORAL | 1 refills | Status: DC

## 2020-03-13 NOTE — Patient Instructions (Signed)

## 2020-03-13 NOTE — Progress Notes (Signed)
I,Yamilka Roman [employer] as a Education administrator for [employer], [name].,have documented all relevant documentation on the behalf of [name], [name],as directed by  [name], [name] while in the presence of [name], [place]. This visit occurred during the SARS-CoV-2 public health emergency.  Safety protocols were in place, including screening questions prior to the visit, additional usage of staff PPE, and extensive cleaning of exam room while observing appropriate contact time as indicated for disinfecting solutions.  Subjective:     Patient ID: [name] , male    DOB: April 01, 1955 , 64 y.o.   MRN: [id number]   Chief Complaint  Patient presents with  . Hypertension  . Diabetes    HPI  Patient here for a f/u on his diabetes and blood pressure  Wt Readings from Last 3 Encounters: 03/13/20 : 181 lb 9.6 oz (82.4 kg) 02/10/20 : 175 lb 11.3 oz (79.7 kg) 12/13/19 : 193 lb 6.4 oz (87.7 kg)  Admits to not checking his blood sugar as much as he had been.  Reports he is not been eating as many sweets.  He is to go to [facility] to evaluate his eyes for a possible new treatment.   Diabetes He presents for his follow-up diabetic visit. He has type 2 diabetes mellitus. His disease course has been stable. There are no hypoglycemic associated symptoms. Pertinent negatives for hypoglycemia include no dizziness, headaches or nervousness/anxiousness. Pertinent negatives for diabetes include no chest pain, no fatigue, no polydipsia, no polyphagia and no polyuria. There are no hypoglycemic complications. Symptoms are worsening. There are no diabetic complications. Risk factors for coronary artery disease include obesity, male sex, sedentary lifestyle and diabetes mellitus. Current diabetic treatment includes oral agent (dual therapy) (tolerating Rybelsus and Tradjenta well. ). He is compliant with treatment all of the time. He is following a generally unhealthy diet. When asked about meal planning, he  reported none. He has not had a previous visit with a dietitian. He rarely participates in exercise. His home blood glucose trend is decreasing steadily. (Last time he checked his blood sugar was 140 this was after eating a piece of cake.  He is trying to cut out on his sweets.) An ACE inhibitor/angiotensin II receptor blocker is being taken. He does not see a podiatrist.Eye exam is not current.     Past Medical History:  Diagnosis Date  . Closed nondisplaced spiral fracture of shaft of right tibia   . Complicated urinary tract infection 09/06/2016  . Depression   . Diabetes mellitus without complication ([place])   . GERD (gastroesophageal reflux disease)   . Gout    right foot  . Hemorrhoids   . Hypertension   . Hypertension   . Macular degeneration   . Microcytic anemia   . Prostate cancer ([place]) 08/2009  . Rectal bleeding 07/31/2016  . Tubular adenoma      Family History  Problem Relation Age of Onset  . Lung cancer Mother        Deceased  . Throat cancer Brother   . Pancreatic cancer Father   . Heart disease Father        Deceased  . Lung cancer Maternal Uncle        nephew  . Diabetes Other      Current Outpatient Medications:  .  acetaminophen (TYLENOL) 325 MG tablet, Take 2 tablets (650 mg total) by mouth every 6 (six) hours as needed for mild pain (or Fever >/= 101)., Disp: , Rfl:  .  alfuzosin (UROXATRAL) 10 MG 24 hr tablet, Take 10 mg by mouth daily with breakfast. , Disp: , Rfl:  .  aspirin EC 81 MG tablet, Take 81 mg by mouth daily with breakfast. , Disp: , Rfl:  .  metoprolol tartrate (LOPRESSOR) 50 MG tablet, TAKE 1 TABLET BY MOUTH EVERY DAY, Disp: 90 tablet, Rfl: 1 .  Multiple Vitamin (MULTIVITAMIN WITH MINERALS) TABS tablet, Take 1 tablet by mouth daily with breakfast., Disp: , Rfl:  .  MYRBETRIQ 25 MG TB24 tablet, TAKE 1 TABLET BY MOUTH EVERY DAY (Patient taking differently: Take 25 mg by mouth daily with breakfast.), Disp: 90 tablet, Rfl: 1 .  nystatin  (NYSTATIN) powder, Apply 1 application topically 3 (three) times daily. (Patient taking differently: Apply 1 application topically 2 (two) times daily as needed (itching/rash).), Disp: 15 g, Rfl: 0 .  RYBELSUS 14 MG TABS, TAKE 1 TABLET BY MOUTH DAILY. 30 MINUTES BEFORE BREAKFAST, Disp: 30 tablet, Rfl: 3 .  traMADol (ULTRAM) 50 MG tablet, Take 1 tablet (50 mg total) by mouth every 6 (six) hours as needed for severe pain., Disp: 20 tablet, Rfl: 0 .  traZODone (DESYREL) 50 MG tablet, Take 1 tablet (50 mg total) by mouth at bedtime as needed for sleep., Disp: 90 tablet, Rfl: 1 .  atorvastatin (LIPITOR) 20 MG tablet, Take 1 tablet (20 mg total) by mouth daily., Disp: 90 tablet, Rfl: 1 .  guaiFENesin (MUCINEX) 600 MG 12 hr tablet, Take 1 tablet (600 mg total) by mouth 2 (two) times daily. (Patient not taking: Reported on 03/13/2020), Disp: 60 tablet, Rfl: 2 .  sildenafil (REVATIO) 20 MG tablet, TAKE 1 TABLET BY MOUTH EVERY DAY (PRIOR AUTHORIZATION DENIED) (Patient not taking: Reported on 03/13/2020), Disp: 30 tablet, Rfl: 5 .  Tetrahydrozoline HCl (VISINE OP), Place 1 drop into both eyes daily as needed (itching/irritation)., Disp: , Rfl:  .  TRADJENTA 5 MG TABS tablet, TAKE 1 TABLET BY MOUTH EVERY DAY (Patient taking differently: Take 5 mg by mouth daily with breakfast. ), Disp: 90 tablet, Rfl: 2   No Known Allergies   Review of Systems  Constitutional: Negative.  Negative for fatigue.  HENT: Negative.   Eyes: Negative.   Respiratory: Negative.   Cardiovascular: Negative.  Negative for chest pain, palpitations and leg swelling.  Gastrointestinal: Negative.   Endocrine: Negative.  Negative for polydipsia, polyphagia and polyuria.  Genitourinary: Negative.   Musculoskeletal: Negative.   Skin: Negative.   Neurological: Negative.  Negative for dizziness and headaches.  Hematological: Negative.   Psychiatric/Behavioral: Negative.  The patient is not nervous/anxious.      Today's Vitals   03/13/20  0958  BP: 136/74  Pulse: 82  Temp: 97.6 F (36.4 C)  TempSrc: Oral  Weight: 181 lb 9.6 oz (82.4 kg)  Height: $Remove'5\' 6"'CUQJkcy$  (1.676 m)  PainSc: 6   PainLoc: Back   Body mass index is 29.31 kg/m.   Objective:  Physical Exam Vitals reviewed.  Constitutional:      General: He is not in acute distress.    Appearance: Normal appearance.  Cardiovascular:     Rate and Rhythm: Normal rate and regular rhythm.     Pulses: Normal pulses.     Heart sounds: Normal heart sounds. No murmur heard.   Pulmonary:     Effort: Pulmonary effort is normal. No respiratory distress.     Breath sounds: Normal breath sounds. No wheezing.  Skin:    General: Skin is warm and dry.     Coloration:  Skin is not jaundiced.  Neurological:     General: No focal deficit present.     Mental Status: He is alert and oriented to person, place, and time.     Cranial Nerves: No cranial nerve deficit.  Psychiatric:        Mood and Affect: Mood normal.        Behavior: Behavior normal.        Thought Content: Thought content normal.        Judgment: Judgment normal.         Assessment And Plan:     1. Type 2 diabetes mellitus with diabetic nephropathy, without long-term current use of insulin (HCC)  Chronic, stable  Continue with tradjenta and rybelsus and tolerating well - Lipid panel - BMP8+eGFR - Hemoglobin A1c - atorvastatin (LIPITOR) 20 MG tablet; Take 1 tablet (20 mg total) by mouth daily.  Dispense: 90 tablet; Refill: 1  2. Stage 3b chronic kidney disease ([place]) Continue follow up with Nephrology   3. Essential hypertension  Chronic, overall well controlled  Continue with current medications, he states he is taking two medications but I only see one medication he is to call back to the office with the name. - BMP8+eGFR  4. Stargardt's disease  He is going to [facility] care for treatment in January  5. Encounter for screening colonoscopy  According to USPTF Colorectal cancer Screening  guidelines. Colonoscopy is recommended every 10 years, starting at age 72years.  Will refer to GI for colon cancer screening.  He was due to have a repeat in 2018 but says he had multiple health problems causing him to not go  Referral made to [facility] - Ambulatory referral to Gastroenterology  6. Prostate cancer ([place])  Continues to see Urology  He is awaiting a PA for his generic viagra  7. Erectile dysfunction, unspecified erectile dysfunction type  He is being followed by urology  I have talked to him again about getting his 2nd covid vaccine, he states "he has natural antibodies", explained to patient the antibodies from the actual disease may not last longer than 3 months, he will think about getting the 2nd vaccine.   Patient was given opportunity to ask questions. Patient verbalized understanding of the plan and was able to repeat key elements of the plan. All questions were answered to their satisfaction.  [name], [name]   I, [name], [name], have reviewed all documentation for this visit. The documentation on 03/13/20 for the exam, diagnosis, procedures, and orders are all accurate and complete.   THE PATIENT IS ENCOURAGED TO PRACTICE SOCIAL DISTANCING DUE TO THE COVID-19 PANDEMIC.

## 2020-03-14 DIAGNOSIS — R262 Difficulty in walking, not elsewhere classified: Secondary | ICD-10-CM | POA: Diagnosis not present

## 2020-03-14 DIAGNOSIS — M6281 Muscle weakness (generalized): Secondary | ICD-10-CM | POA: Diagnosis not present

## 2020-03-14 DIAGNOSIS — E785 Hyperlipidemia, unspecified: Secondary | ICD-10-CM | POA: Diagnosis not present

## 2020-03-14 DIAGNOSIS — M5459 Other low back pain: Secondary | ICD-10-CM | POA: Diagnosis not present

## 2020-03-14 DIAGNOSIS — N183 Chronic kidney disease, stage 3 unspecified: Secondary | ICD-10-CM | POA: Diagnosis not present

## 2020-03-14 DIAGNOSIS — N1330 Unspecified hydronephrosis: Secondary | ICD-10-CM | POA: Diagnosis not present

## 2020-03-14 DIAGNOSIS — S37009D Unspecified injury of unspecified kidney, subsequent encounter: Secondary | ICD-10-CM | POA: Diagnosis not present

## 2020-03-14 DIAGNOSIS — Z9181 History of falling: Secondary | ICD-10-CM | POA: Diagnosis not present

## 2020-03-14 DIAGNOSIS — E119 Type 2 diabetes mellitus without complications: Secondary | ICD-10-CM | POA: Diagnosis not present

## 2020-03-16 DIAGNOSIS — M6281 Muscle weakness (generalized): Secondary | ICD-10-CM | POA: Diagnosis not present

## 2020-03-16 DIAGNOSIS — E785 Hyperlipidemia, unspecified: Secondary | ICD-10-CM | POA: Diagnosis not present

## 2020-03-16 DIAGNOSIS — N183 Chronic kidney disease, stage 3 unspecified: Secondary | ICD-10-CM | POA: Diagnosis not present

## 2020-03-16 DIAGNOSIS — E119 Type 2 diabetes mellitus without complications: Secondary | ICD-10-CM | POA: Diagnosis not present

## 2020-03-16 DIAGNOSIS — N1330 Unspecified hydronephrosis: Secondary | ICD-10-CM | POA: Diagnosis not present

## 2020-03-16 DIAGNOSIS — S37009D Unspecified injury of unspecified kidney, subsequent encounter: Secondary | ICD-10-CM | POA: Diagnosis not present

## 2020-03-16 DIAGNOSIS — R262 Difficulty in walking, not elsewhere classified: Secondary | ICD-10-CM | POA: Diagnosis not present

## 2020-03-16 DIAGNOSIS — Z9181 History of falling: Secondary | ICD-10-CM | POA: Diagnosis not present

## 2020-03-16 DIAGNOSIS — M5459 Other low back pain: Secondary | ICD-10-CM | POA: Diagnosis not present

## 2020-03-22 DIAGNOSIS — H3554 Dystrophies primarily involving the retinal pigment epithelium: Secondary | ICD-10-CM | POA: Diagnosis not present

## 2020-03-22 DIAGNOSIS — H547 Unspecified visual loss: Secondary | ICD-10-CM | POA: Diagnosis not present

## 2020-03-22 DIAGNOSIS — H3552 Pigmentary retinal dystrophy: Secondary | ICD-10-CM | POA: Diagnosis not present

## 2020-03-22 DIAGNOSIS — R94111 Abnormal electroretinogram [ERG]: Secondary | ICD-10-CM | POA: Diagnosis not present

## 2020-03-22 DIAGNOSIS — H3553 Other dystrophies primarily involving the sensory retina: Secondary | ICD-10-CM | POA: Diagnosis not present

## 2020-03-22 DIAGNOSIS — Z135 Encounter for screening for eye and ear disorders: Secondary | ICD-10-CM | POA: Diagnosis not present

## 2020-03-22 DIAGNOSIS — H3589 Other specified retinal disorders: Secondary | ICD-10-CM | POA: Diagnosis not present

## 2020-03-27 DIAGNOSIS — L603 Nail dystrophy: Secondary | ICD-10-CM | POA: Diagnosis not present

## 2020-03-27 DIAGNOSIS — E1151 Type 2 diabetes mellitus with diabetic peripheral angiopathy without gangrene: Secondary | ICD-10-CM | POA: Diagnosis not present

## 2020-03-27 DIAGNOSIS — L84 Corns and callosities: Secondary | ICD-10-CM | POA: Diagnosis not present

## 2020-03-27 DIAGNOSIS — I739 Peripheral vascular disease, unspecified: Secondary | ICD-10-CM | POA: Diagnosis not present

## 2020-03-29 ENCOUNTER — Other Ambulatory Visit: Payer: Self-pay | Admitting: Nurse Practitioner

## 2020-03-29 DIAGNOSIS — R32 Unspecified urinary incontinence: Secondary | ICD-10-CM

## 2020-03-29 DIAGNOSIS — R350 Frequency of micturition: Secondary | ICD-10-CM

## 2020-03-30 ENCOUNTER — Ambulatory Visit: Payer: Medicare Other

## 2020-03-30 DIAGNOSIS — N1832 Chronic kidney disease, stage 3b: Secondary | ICD-10-CM

## 2020-03-30 DIAGNOSIS — E1121 Type 2 diabetes mellitus with diabetic nephropathy: Secondary | ICD-10-CM

## 2020-03-30 NOTE — Chronic Care Management (AMB) (Signed)
Chronic Care Management    Social Work Note  03/30/2020 Name: [name] MRN: [id number] DOB: 23-Oct-1955  [name] is a 65 y.o. year old male who is a primary care patient of [name], [place]. The CCM team was consulted to assist the patient with chronic disease management and/or care coordination needs related to: Level of Care Concerns.   Engaged with patient by telephone for follow up visit in response to provider referral for social work chronic care management and care coordination services.   Consent to Services:  The patient was given information about Chronic Care Management services, agreed to services, and gave verbal consent prior to initiation of services.  Please see initial visit note for detailed documentation.   Patient agreed to services and consent obtained.   Assessment: Review of patient past medical history, allergies, medications, and health status, including review of relevant consultants reports was performed today as part of a comprehensive evaluation and provision of chronic care management and care coordination services.     SDOH (Social Determinants of Health) assessments and interventions performed: No  Advanced Directives Status: See Vynca application for related entries.  CCM Care Plan  No Known Allergies  Outpatient Encounter Medications as of 03/30/2020  Medication Sig Note  . acetaminophen (TYLENOL) 325 MG tablet Take 2 tablets (650 mg total) by mouth every 6 (six) hours as needed for mild pain (or Fever >/= 101).   Marland Kitchen alfuzosin (UROXATRAL) 10 MG 24 hr tablet Take 10 mg by mouth daily with breakfast.    . aspirin EC 81 MG tablet Take 81 mg by mouth daily with breakfast.    . atorvastatin (LIPITOR) 20 MG tablet Take 1 tablet (20 mg total) by mouth daily.   . metoprolol tartrate (LOPRESSOR) 50 MG tablet TAKE 1 TABLET BY MOUTH EVERY DAY   . Multiple Vitamin (MULTIVITAMIN WITH MINERALS) TABS tablet Take 1 tablet by mouth daily with breakfast.   .  MYRBETRIQ 25 MG TB24 tablet TAKE 1 TABLET BY MOUTH EVERY DAY   . nystatin (NYSTATIN) powder Apply 1 application topically 3 (three) times daily. (Patient taking differently: Apply 1 application topically 2 (two) times daily as needed (itching/rash).)   . RYBELSUS 14 MG TABS TAKE 1 TABLET BY MOUTH DAILY. 30 MINUTES BEFORE BREAKFAST   . sildenafil (REVATIO) 20 MG tablet TAKE 1 TABLET BY MOUTH EVERY DAY (PRIOR AUTHORIZATION DENIED) (Patient not taking: Reported on 03/13/2020)   . Tetrahydrozoline HCl (VISINE OP) Place 1 drop into both eyes daily as needed (itching/irritation).   . TRADJENTA 5 MG TABS tablet TAKE 1 TABLET BY MOUTH EVERY DAY (Patient taking differently: Take 5 mg by mouth daily with breakfast. )   . traMADol (ULTRAM) 50 MG tablet Take 1 tablet (50 mg total) by mouth every 6 (six) hours as needed for severe pain. 02/10/2020: #20 filled 10/07/2019  . traZODone (DESYREL) 50 MG tablet Take 1 tablet (50 mg total) by mouth at bedtime as needed for sleep.    No facility-administered encounter medications on file as of 03/30/2020.    Patient Active Problem List   Diagnosis Date Noted  . AKI (acute kidney injury) ([place]) 02/11/2020  . Acute kidney injury superimposed on chronic kidney disease ([place]) 02/10/2020  . Hydroureteronephrosis 02/10/2020  . Nausea and vomiting 02/10/2020  . History of COVID-19 02/10/2020  . Pneumonia due to COVID-19 virus 09/29/2019  . Acute on chronic kidney failure ([place]) 09/28/2019  . Hyponatremia 09/28/2019  . Sinus tachycardia 09/28/2019  . Insomnia  08/26/2018  . Nephropathy 03/26/2018  . Urinary urgency 03/26/2018  . Type 2 diabetes mellitus with diabetic nephropathy, without long-term current use of insulin ([place]) 03/26/2018  . Essential hypertension 03/26/2018  . Chronic kidney disease (CKD), stage III (moderate) ([place]) 03/26/2018  . Conductive hearing loss, bilateral 11/07/2017  . Uncontrolled type 2 diabetes mellitus with hyperglycemia ([place]) 07/31/2016  .  Dyslipidemia associated with type 2 diabetes mellitus ([place]) 05/29/2016  . Microcytic anemia 02/17/2012  . 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 Benign hypertension 02/17/2012  . Depressive disorder 02/17/2012    Conditions to be addressed/monitored: DMII and CKD Stage III; Level of care concerns  Care Plan : Social Work SDOH Plan of Care  Updates made by [name] since 03/30/2020 12:00 AM    Problem: Long-Term Care Planning     Long-Range Goal: Effective Long-Term Care Planning   Start Date: 03/30/2020  Expected End Date: 07/28/2020  Priority: Medium  Note:   Current Barriers:  . Limited social support . Level of care concerns . Chronic conditions including CKD III and DM II which put patient at increased risk of hospitalization  Social Work Clinical Goal(s):  Marland Kitchen Over the next 120 days, patient will work with SW to address concerns related to long term care planning  Interventions: . 1:1 collaboration with [name], [place] regarding development and update of comprehensive plan of care as evidenced by provider attestation and co-signature . Inter-disciplinary care team collaboration (see longitudinal plan of care) . Successful outbound call placed to the patient to discuss long term care plans . Discussed the patient remains on the wait list for [facility] . Determined the patient does not have interest in locating alternative placement options and would rather wait on a bed with [facility] . Scheduled follow up to the patient over the next 60 days . Advised the patient to contact SW as needed prior to next scheduled call  Patient Goals/Self-Care Activities Over the next 90 days, patient will:   - Patient will self administer medications as prescribed Patient will attend all scheduled provider appointments Patient will call provider office for new concerns or questions Maintain contact with [facility] to follow up on bed availability Contact SW as needed prior  to next scheduled call  Follow up Plan: SW will follow up with patient by phone over the next 60 days       Follow Up Plan: SW will follow up with patient by phone over the next 60 days      [name], BSW, CDP Social Worker, Certified Dementia Practitioner [place] [facility] [place] [facility] [phone]  Total time spent performing care coordination and/or care management activities with the patient by phone or face to face = 10 minutes.

## 2020-03-30 NOTE — Patient Instructions (Signed)
  Goals we discussed today:  Goals Addressed    Other   .  Identify long term care plan        Timeframe:  Long-Range Goal Priority:  Medium Start Date:  1.13.22                           Expected End Date: 5.13.22                      Next planned outreach: 3.22.22  Patient Goals/Self-Care Activities Over the next 90 days, patient will:   - Patient will self administer medications as prescribed Patient will attend all scheduled provider appointments Patient will call provider office for new concerns or questions Maintain contact with [facility] to follow up on bed availability Contact SW as needed prior to next scheduled call

## 2020-04-06 ENCOUNTER — Telehealth: Payer: Medicare Other

## 2020-04-06 ENCOUNTER — Other Ambulatory Visit: Payer: Self-pay

## 2020-04-06 ENCOUNTER — Ambulatory Visit: Payer: Self-pay

## 2020-04-06 DIAGNOSIS — N1832 Chronic kidney disease, stage 3b: Secondary | ICD-10-CM

## 2020-04-06 DIAGNOSIS — E1121 Type 2 diabetes mellitus with diabetic nephropathy: Secondary | ICD-10-CM

## 2020-04-06 DIAGNOSIS — I10 Essential (primary) hypertension: Secondary | ICD-10-CM

## 2020-04-06 NOTE — Patient Instructions (Signed)
Goals Addressed      Other   .  Monitor and Manage My Blood Sugar-Diabetes Type 2   On track     Timeframe:  Long-Range Goal Priority:  Medium Start Date:  04/06/20                          Expected End Date:  07/05/20                      Follow Up Date: 06/14/20   - check blood sugar at prescribed times - check blood sugar before and after exercise - check blood sugar if I feel it is too high or too low - take the blood sugar log to all doctor visits - take the blood sugar meter to all doctor visits    Why is this important?    Checking your blood sugar at home helps to keep it from getting very high or very low.   Writing the results in a diary or log helps the doctor know how to care for you.   Your blood sugar log should have the time, date and the results.   Also, write down the amount of insulin or other medicine that you take.   Other information, like what you ate, exercise done and how you were feeling, will also be helpful.     Notes:     .  Prevent disease progression related to chronic kidney disease   On track     Timeframe:  Long-Range Goal Priority:  Medium Start Date: 04/06/20                           Expected End Date:  07/05/20  Follow Up Date:  06/14/20  Over the next 90 days, patient will Self administers oral medications as prescribed Attends all scheduled provider appointments Increase daily water intake to 64 oz  Call Nephrologist to schedule a follow up appointment                         .  Track and Manage My Blood Pressure-Hypertension   On track     Timeframe:  Long-Range Goal Priority:  Medium Start Date: 04/06/20                           Expected End Date: 07/05/20                   Follow Up Date  06/14/20   write blood pressure results in a log or diary  Self administers medications as prescribed Attends all scheduled provider appointments Calls provider office for new concerns, questions, or BP outside discussed parameters Checks BP  and records as discussed Follows a low sodium diet/DASH diet   Why is this important?    You won't feel high blood pressure, but it can still hurt your blood vessels.   High blood pressure can cause heart or kidney problems. It can also cause a stroke.   Making lifestyle changes like losing a Hermen Mario weight or eating less salt will help.   Checking your blood pressure at home and at different times of the day can help to control blood pressure.   If the doctor prescribes medicine remember to take it the way the doctor ordered.   Call the office if you cannot afford  the medicine or if there are questions about it.     Notes:

## 2020-04-06 NOTE — Chronic Care Management (AMB) (Signed)
Chronic Care Management   CCM RN Visit Note  04/06/2020 Name: [name] MRN: [id number] DOB: 09-29-1955  Subjective: [name] is a 65 y.o. year old male who is a primary care patient of [name], [name]. The care management team was consulted for assistance with disease management and care coordination needs.    Engaged with patient by telephone for follow up visit in response to provider referral for case management and/or care coordination services.   Consent to Services:  The patient was given information about Chronic Care Management services, agreed to services, and gave verbal consent prior to initiation of services.  Please see initial visit note for detailed documentation.   Patient agreed to services and verbal consent obtained.   Assessment: Review of patient past medical history, allergies, medications, health status, including review of consultants reports, laboratory and other test data, was performed as part of comprehensive evaluation and provision of chronic care management services.   SDOH (Social Determinants of Health) assessments and interventions performed:    CCM Care Plan  No Known Allergies  Outpatient Encounter Medications as of 04/06/2020  Medication Sig Note  . acetaminophen (TYLENOL) 325 MG tablet Take 2 tablets (650 mg total) by mouth every 6 (six) hours as needed for mild pain (or Fever >/= 101).   Marland Kitchen alfuzosin (UROXATRAL) 10 MG 24 hr tablet Take 10 mg by mouth daily with breakfast.    . aspirin EC 81 MG tablet Take 81 mg by mouth daily with breakfast.    . atorvastatin (LIPITOR) 20 MG tablet Take 1 tablet (20 mg total) by mouth daily.   . metoprolol tartrate (LOPRESSOR) 50 MG tablet TAKE 1 TABLET BY MOUTH EVERY DAY   . Multiple Vitamin (MULTIVITAMIN WITH MINERALS) TABS tablet Take 1 tablet by mouth daily with breakfast.   . MYRBETRIQ 25 MG TB24 tablet TAKE 1 TABLET BY MOUTH EVERY DAY   . nystatin (NYSTATIN) powder Apply 1 application topically 3  (three) times daily. (Patient taking differently: Apply 1 application topically 2 (two) times daily as needed (itching/rash).)   . RYBELSUS 14 MG TABS TAKE 1 TABLET BY MOUTH DAILY. 30 MINUTES BEFORE BREAKFAST   . sildenafil (REVATIO) 20 MG tablet TAKE 1 TABLET BY MOUTH EVERY DAY (PRIOR AUTHORIZATION DENIED) (Patient not taking: Reported on 03/13/2020)   . Tetrahydrozoline HCl (VISINE OP) Place 1 drop into both eyes daily as needed (itching/irritation).   . TRADJENTA 5 MG TABS tablet TAKE 1 TABLET BY MOUTH EVERY DAY (Patient taking differently: Take 5 mg by mouth daily with breakfast. )   . traMADol (ULTRAM) 50 MG tablet Take 1 tablet (50 mg total) by mouth every 6 (six) hours as needed for severe pain. 02/10/2020: #20 filled 10/07/2019  . traZODone (DESYREL) 50 MG tablet Take 1 tablet (50 mg total) by mouth at bedtime as needed for sleep.    No facility-administered encounter medications on file as of 04/06/2020.    Patient Active Problem List   Diagnosis Date Noted  . AKI (acute kidney injury) (HCC) 02/11/2020  . Acute kidney injury superimposed on chronic kidney disease (HCC) 02/10/2020  . Hydroureteronephrosis 02/10/2020  . Nausea and vomiting 02/10/2020  . History of COVID-19 02/10/2020  . Pneumonia due to COVID-19 virus 09/29/2019  . Acute on chronic kidney failure (HCC) 09/28/2019  . Hyponatremia 09/28/2019  . Sinus tachycardia 09/28/2019  . Insomnia 08/26/2018  . Nephropathy 03/26/2018  . Urinary urgency 03/26/2018  . Type 2 diabetes mellitus with diabetic nephropathy, without long-term  current use of insulin ([place]) 03/26/2018  . Essential hypertension 03/26/2018  . Chronic kidney disease (CKD), stage III (moderate) ([place]) 03/26/2018  . Conductive hearing loss, bilateral 11/07/2017  . Uncontrolled type 2 diabetes mellitus with hyperglycemia ([place]) 07/31/2016  . Dyslipidemia associated with type 2 diabetes mellitus ([place]) 05/29/2016  . Microcytic anemia 02/17/2012  . H/O post-polio  syndrome 02/17/2012  . Benign hypertension 02/17/2012  . Depressive disorder 02/17/2012    Conditions to be addressed/monitored:CKD III, DM II, HTN  Care Plan : Chronic Kidney (Adult)  Updates made by Lynne Logan, RN since 04/06/2020 12:00 AM    Problem: Disease Progression   Priority: Medium    Long-Range Goal: Disease Progression Prevented or Minimized   Start Date: 04/06/2020  Expected End Date: 07/05/2020  This Visit's Progress: On track  Priority: Medium  Note:   Current Barriers:   Ineffective Self Health Maintenance  Currently UNABLE TO independently self manage needs related to chronic health conditions.   Knowledge Deficits related to short term plan for care coordination needs and long term plans for chronic disease management needs Case Manager Clinical Goal(s):  Marland Kitchen Collaboration with [name], [name] regarding development and update of comprehensive plan of care as evidenced by provider attestation and co-signature . Inter-disciplinary care team collaboration (see longitudinal plan of care)  Over the next 90 days, patient will work with care management team to address care coordination and chronic disease management needs related to Disease Management  Educational Needs  Care Coordination  Medication Management and Education  Psychosocial Support   Interventions:   Re-educated patient about stages of chronic kidney disease; Reviewed most current GFR with patient and discussed current stage of renal decline  Educated on dietary recommendations and stressed the importance of staying well hydrated with at least 64 oz of water per day   Educated on symptoms suggestive of renal decline and when to call the doctor if needed  Discussed patient will plan to call his Nephrologist to schedule a follow up appointment   Discussed plans with patient for ongoing care management follow up and provided patient with direct contact information for care management  team Patient Goal/Self-Care Activities:  Over the next 90 days, patient will  Self administers oral medications as prescribed Attends all scheduled provider appointments Increase daily water intake to 64 oz  Call Nephrologist to schedule a follow up appointment                      Follow Up Plan: Telephone follow up appointment with care management team member scheduled for: 06/14/20   Care Plan : Diabetes Type 2 (Adult)  Updates made by Lynne Logan, RN since 04/06/2020 12:00 AM    Problem: Disease Progression (Diabetes, Type 2)   Priority: Medium    Long-Range Goal: Disease Progression Prevented or Minimized   Start Date: 04/06/2020  Expected End Date: 07/05/2020  This Visit's Progress: On track  Priority: Medium  Note:   Objective:  Lab Results  Component Value Date   HGBA1C 7.2 (H) 03/13/2020 .   Lab Results  Component Value Date   CREATININE 1.70 (H) 03/13/2020   CREATININE 1.41 (H) 02/12/2020   CREATININE 1.75 (H) 02/11/2020 .   Marland Kitchen No results found for: EGFR Current Barriers:  Marland Kitchen Knowledge Deficits related to basic Diabetes pathophysiology and self care/management . Knowledge Deficits related to medications used for management of diabetes Case Manager Clinical Goal(s):  Marland Kitchen Collaboration with [name], [name] regarding development  and update of comprehensive plan of care as evidenced by provider attestation and co-signature . Inter-disciplinary care team collaboration (see longitudinal plan of care) . Over the next 90 days, patient will demonstrate improved adherence to prescribed treatment plan for diabetes self care/management as evidenced by:  . daily monitoring and recording of CBG  . adherence to ADA/ carb modified diet . exercise 5 days/week . adherence to prescribed medication regimen Interventions:  . Provided education to patient about basic DM disease process . Reviewed medications with patient and discussed importance of medication adherence . Educated  patient on daily glycemic control, FBS 80-130, <180 after meals  . Educated on dietary and exercise recommendations, patient admits he is eating better but sometimes indulges in [place] cups and cake, he walking 20 minutes everyday at his local gym  . Advised patient, providing education and rationale, to check cbg before meals and at bedtime and record, calling the CCM team and or PCP for findings outside established parameters.   . Review of patient status, including review of consultants reports, relevant laboratory and other test results, and medications completed. . Discussed plans with patient for ongoing care management follow up and provided patient with direct contact information for care management team Patient Goals/Self-Care Activities . Over the next 90 days, patient will:  Self administers oral medications as prescribed Attends all scheduled provider appointments Checks blood sugars as prescribed and utilize hyper and hypoglycemia protocol as needed Adheres to prescribed ADA/carb modified  Follow Up Plan: Telephone follow up appointment with care management team member scheduled for: 06/14/20   Care Plan : Hypertension (Adult)  Updates made by Lynne Logan, RN since 04/06/2020 12:00 AM    Problem: Disease Progression (Hypertension)   Priority: Medium    Long-Range Goal: Disease Progression Prevented or Minimized   Start Date: 04/06/2020  Expected End Date: 07/05/2020  This Visit's Progress: On track  Priority: Medium  Note:   Objective:  . Last practice recorded BP readings:  BP Readings from Last 3 Encounters:  03/13/20 136/74  02/12/20 (!) 142/88  12/13/19 126/84 .   Marland Kitchen Most recent eGFR/CrCl: No results found for: EGFR  No components found for: CRCL Current Barriers:  Marland Kitchen Knowledge Deficits related to basic understanding of hypertension pathophysiology and self care management . Knowledge Deficits related to understanding of medications prescribed for management of  hypertension Case Manager Clinical Goal(s):  Marland Kitchen Over the next 90 days, patient will demonstrate improved health management independence as evidenced by checking blood pressure as directed and notifying PCP if SBP>140 or DBP > 90, taking all medications as prescribe, and adhering to a low sodium diet as discussed. Interventions:  . Collaboration with [name], [name] regarding development and update of comprehensive plan of care as evidenced by provider attestation and co-signature . Inter-disciplinary care team collaboration (see longitudinal plan of care) . Evaluation of current treatment plan related to hypertension self management and patient's adherence to plan as established by provider. . Reviewed medications with patient and discussed importance of compliance . Reviewed scheduled/upcoming provider appointments including: PCP scheduled for 06/12/20 $[email or web address]'@[email or web address]$ :45 am  . Discussed plans with patient for ongoing care management follow up and provided patient with direct contact information for care management team Patient Goals/Self-Care Activities . Over the next 90 days, patient will:  Self administers medications as prescribed Attends all scheduled provider appointments Calls provider office for new concerns, questions, or BP outside discussed parameters Checks BP and records as discussed Follows a low sodium diet/DASH diet  Follow Up Plan: Telephone follow up appointment with care management team member scheduled for: 06/14/20     Plan:Telephone follow up appointment with care management team member scheduled for:  06/14/20  Barb Merino, RN, BSN, CCM Care Management Coordinator [facility]  Direct Phone: [phone]

## 2020-04-07 ENCOUNTER — Telehealth: Payer: Self-pay | Admitting: *Deleted

## 2020-04-07 NOTE — Chronic Care Management (AMB) (Signed)
  Chronic Care Management   Note  04/07/2020 Name: [name] MRN: [id number] DOB: Feb 08, 1956  [name] is a 65 y.o. year old male who is a primary care patient of [name], [place]. [name] is currently enrolled in care management services. An additional referral for Pharmacy was placed.   Follow up plan: Unsuccessful telephone outreach attempt made. A HIPAA compliant phone message was left for the patient providing contact information and requesting a return call. The care management team will reach out to the patient again over the next 7 days. If patient returns call to provider office, please advise to call [place] at [phone].  [facility]

## 2020-04-07 NOTE — Addendum Note (Signed)
Addended by: Lynne Logan on: 04/07/2020 10:06 AM   Modules accepted: Orders

## 2020-04-10 NOTE — Chronic Care Management (AMB) (Signed)
  Chronic Care Management   Note  04/10/2020 Name: [name] MRN: [id number] DOB: October 03, 1955  KAMRYN GAUTHIER is a 65 y.o. year old male who is a primary care patient of [name], [place]. RIGLEY NIESS is currently enrolled in care management services. An additional referral for Pharmacy was placed.    Mr. Plemons was given information about Care Management services today including:  1. CCM service includes personalized support from designated clinical staff supervised by the physician, including individualized plan of care and coordination with other care providers 2. 24/7 contact phone numbers for assistance for urgent and routine care needs. 3. Service will only be billed when office clinical staff spend 20 minutes or more in a month to coordinate care. 4. Only one practitioner may furnish and bill the service in a calendar month. 5. The patient may stop CCM services at amy time (effective at the end of the month) by phone call to the office staff. 6. The patient will be responsible for cost sharing (co-pay) or up to 20% of the service fee (after annual deductible is met)  Patient agreed to services and verbal consent obtained.  Follow up plan: Telephone appointment with care management team member scheduled for: 05/04/2020    [facility]  Direct Dial: [phone]

## 2020-04-27 ENCOUNTER — Telehealth: Payer: Self-pay

## 2020-04-27 NOTE — Chronic Care Management (AMB) (Signed)
Chronic Care Management Pharmacy Assistant   Name: [name]  MRN: [id number] DOB: 1955-06-22  Reason for Encounter: Medication Review/Initial Questions for Pharmacist visit on 05/04/20.  Patient Questions:  1.  Have you seen any other providers since your last visit? Yes, 04/07/20-Little, Claudette Stapler, RN (CCM). 03/30/20-Humble, Kendra (CCM). 03/22/20-Lannaccone, Nils Pyle (Consult). 03/13/20-Moore, [name], [name] (OV).   2.  Any changes in your medicines or health? No     PCP : [name], [name]  Allergies:  No Known Allergies  Medications: Outpatient Encounter Medications as of 04/27/2020  Medication Sig Note   acetaminophen (TYLENOL) 325 MG tablet Take 2 tablets (650 mg total) by mouth every 6 (six) hours as needed for mild pain (or Fever >/= 101).    alfuzosin (UROXATRAL) 10 MG 24 hr tablet Take 10 mg by mouth daily with breakfast.     aspirin EC 81 MG tablet Take 81 mg by mouth daily with breakfast.     atorvastatin (LIPITOR) 20 MG tablet Take 1 tablet (20 mg total) by mouth daily.    metoprolol tartrate (LOPRESSOR) 50 MG tablet TAKE 1 TABLET BY MOUTH EVERY DAY    Multiple Vitamin (MULTIVITAMIN WITH MINERALS) TABS tablet Take 1 tablet by mouth daily with breakfast.    MYRBETRIQ 25 MG TB24 tablet TAKE 1 TABLET BY MOUTH EVERY DAY    nystatin (NYSTATIN) powder Apply 1 application topically 3 (three) times daily. (Patient taking differently: Apply 1 application topically 2 (two) times daily as needed (itching/rash).)    RYBELSUS 14 MG TABS TAKE 1 TABLET BY MOUTH DAILY. 30 MINUTES BEFORE BREAKFAST    sildenafil (REVATIO) 20 MG tablet TAKE 1 TABLET BY MOUTH EVERY DAY (PRIOR AUTHORIZATION DENIED) (Patient not taking: Reported on 03/13/2020)    Tetrahydrozoline HCl (VISINE OP) Place 1 drop into both eyes daily as needed (itching/irritation).    TRADJENTA 5 MG TABS tablet TAKE 1 TABLET BY MOUTH EVERY DAY (Patient taking differently: Take 5 mg by mouth daily with breakfast. )     traMADol (ULTRAM) 50 MG tablet Take 1 tablet (50 mg total) by mouth every 6 (six) hours as needed for severe pain. 02/10/2020: #20 filled 10/07/2019   traZODone (DESYREL) 50 MG tablet Take 1 tablet (50 mg total) by mouth at bedtime as needed for sleep.    No facility-administered encounter medications on file as of 04/27/2020.    Current Diagnosis: Patient Active Problem List   Diagnosis Date Noted   AKI (acute kidney injury) ([place]) 02/11/2020   Acute kidney injury superimposed on chronic kidney disease ([place]) 02/10/2020   Hydroureteronephrosis 02/10/2020   Nausea and vomiting 02/10/2020   History of COVID-19 02/10/2020   Pneumonia due to COVID-19 virus 09/29/2019   Acute on chronic kidney failure ([place]) 09/28/2019   Hyponatremia 09/28/2019   Sinus tachycardia 09/28/2019   Insomnia 08/26/2018   Nephropathy 03/26/2018   Urinary urgency 03/26/2018   Type 2 diabetes mellitus with diabetic nephropathy, without long-term current use of insulin ([place]) 03/26/2018   Essential hypertension 03/26/2018   Chronic kidney disease (CKD), stage III (moderate) ([place]) 03/26/2018   Conductive hearing loss, bilateral 11/07/2017   Uncontrolled type 2 diabetes mellitus with hyperglycemia ([place]) 07/31/2016   Dyslipidemia associated with type 2 diabetes mellitus ([place]) 05/29/2016   Microcytic anemia 02/17/2012   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   Benign hypertension 02/17/2012   Depressive disorder 02/17/2012   Have you seen any other providers since your last visit? Patient reports no.  Any changes in your medications  or health? No.  Any side effects from any medications? No.  Do you have an symptoms or problems not managed by your medications? No.  Any concerns about your health right now? No  Has your provider asked that you check blood pressure, blood sugar, or follow special diet at home? Patient reports he was directed to check his blood sugar and checking 3 times a  week.  Do you get any type of exercise on a regular basis? Patient reports he walk about 20 minutes a day around the block.  Can you think of a goal you would like to reach for your health? Will like to lose weight, maintain blood pressure readings, and control his A1c.  Do you have any problems getting your medications? No.  Is there anything that you would like to discuss during the appointment? No.  Please bring medications and supplements to appointment. Patient aware to have medications and supplements during call.    Follow-Up:  Pharmacist Review   Orlando Penner, CPP Notified.  Raynelle Highland, [place] Pharmacist Assistant [phone] CCM Total Time: 16 minutes

## 2020-05-04 ENCOUNTER — Telehealth: Payer: Medicare Other

## 2020-05-04 NOTE — Progress Notes (Incomplete)
Chronic Care Management Pharmacy Note  05/04/2020 Name:  [name] MRN:  [id number] DOB:  June 14, 1955  Subjective: [name] is an 65 y.o. year old male who is a primary patient of [name], [place].  The CCM team was consulted for assistance with disease management and care coordination needs.    {CCMTELEPHONEFACETOFACE:21091510} for {CCMINITIALFOLLOWUPCHOICE:21091511} in response to provider referral for pharmacy case management and/or care coordination services.   Consent to Services:  {CCMCONSENTOPTIONS:25074}  Patient Care Team: Randy Brine, [name] as PCP - General (General Practice) Rex Kras, Claudette Stapler, RN as Case Manager [name] as Social Worker Damaya Channing, Sharyn Blitz, [facility] (Pharmacist)  Recent office visits: ***  Recent consult visits: [facility] visits: {Hospital DC Yes/No:25215}  Objective:  Lab Results  Component Value Date   CREATININE 1.70 (H) 03/13/2020   BUN 35 (H) 03/13/2020   GFRNONAA 41 (L) 03/13/2020   GFRAA 48 (L) 03/13/2020   NA 137 03/13/2020   K 4.9 03/13/2020   CALCIUM 10.0 03/13/2020   CO2 21 03/13/2020    Lab Results  Component Value Date/Time   HGBA1C 7.2 (H) 03/13/2020 11:39 AM   HGBA1C 7.7 (H) 12/13/2019 03:13 PM   HGBA1C 7.6 06/26/2016 12:00 AM   HGBA1C 7.6 06/26/2016 12:00 AM   MICROALBUR 80 08/26/2019 03:15 PM   MICROALBUR 150 03/26/2018 11:57 AM    Last diabetic Eye exam: No results found for: HMDIABEYEEXA  Last diabetic Foot exam: No results found for: HMDIABFOOTEX   Lab Results  Component Value Date   CHOL 198 03/13/2020   HDL 38 (L) 03/13/2020   LDLCALC 115 (H) 03/13/2020   TRIG 255 (H) 03/13/2020   CHOLHDL 5.2 (H) 03/13/2020    Hepatic Function Latest Ref Rng & Units 02/10/2020 12/13/2019 10/05/2019  Total Protein 6.5 - 8.1 g/dL 6.8 7.3 6.5  Albumin 3.5 - 5.0 g/dL 3.9 4.5 3.0(L)  AST 15 - 41 U/L $Remo'20 25 18  'fYTRm$ ALT 0 - 44 U/L 18 33 53(H)  Alk Phosphatase 38 - 126 U/L 74 137(H) 86  Total Bilirubin 0.3 - 1.2 mg/dL  1.3(H) 0.3 0.8    Lab Results  Component Value Date/Time   TSH 0.146 (L) 09/28/2019 02:32 PM   TSH 1.461 ***Test methodology is 3rd generation TSH*** 04/19/2008 04:30 AM   FREET4 0.85 (L) 04/19/2008 04:30 AM    CBC Latest Ref Rng & Units 02/11/2020 02/10/2020 10/05/2019  WBC 4.0 - 10.5 K/uL 7.0 7.7 11.2(H)  Hemoglobin 13.0 - 17.0 g/dL 16.1 17.0 16.6  Hematocrit 39.0 - 52.0 % 49.5 52.6(H) 51.3  Platelets 150 - 400 K/uL 169 171 276    Lab Results  Component Value Date/Time   VD25OH 56 08/18/2008 09:21 PM    Clinical ASCVD: {YES/NO:21197} The 10-year ASCVD risk score Mikey Bussing DC Jr., et al., 2013) is: 35.2%   Values used to calculate the score:     Age: 76 years     Sex: Male     Is Non-Hispanic African American: No     Diabetic: Yes     Tobacco smoker: No     Systolic Blood Pressure: 060 mmHg     Is BP treated: Yes     HDL Cholesterol: 38 mg/dL     Total Cholesterol: 198 mg/dL    Depression screen [facility] 2/9 08/26/2019 02/02/2019 10/12/2018  Decreased Interest 0 0 3  Down, Depressed, Hopeless 2 0 1  PHQ - 2 Score 2 0 4  Altered sleeping 2 - 0  Tired, decreased  energy 0 - 3  Change in appetite 0 - 0  Feeling bad or failure about yourself  0 - 0  Trouble concentrating 0 - 0  Moving slowly or fidgety/restless 0 - 0  Suicidal thoughts 0 - 0  PHQ-9 Score 4 - 7  Difficult doing work/chores Not difficult at all - Not difficult at all  Some recent data might be hidden     ***Other: (CHADS2VASc if Afib, MMRC or CAT for COPD, ACT, DEXA)  Social History   Tobacco Use  Smoking Status Never Smoker  Smokeless Tobacco Never Used   BP Readings from Last 3 Encounters:  03/13/20 136/74  02/12/20 (!) 142/88  12/13/19 126/84   Pulse Readings from Last 3 Encounters:  03/13/20 82  02/12/20 73  12/13/19 98   Wt Readings from Last 3 Encounters:  03/13/20 181 lb 9.6 oz (82.4 kg)  02/10/20 175 lb 11.3 oz (79.7 kg)  12/13/19 193 lb 6.4 oz (87.7 kg)    Assessment/Interventions:  Review of patient past medical history, allergies, medications, health status, including review of consultants reports, laboratory and other test data, was performed as part of comprehensive evaluation and provision of chronic care management services.   SDOH:  (Social Determinants of Health) assessments and interventions performed: {yes/no:20286}   CCM Care Plan  No Known Allergies  Medications Reviewed Today    Reviewed by Randy Brine, [name] (Family Nurse Practitioner) on 03/13/20 at 1032  Med List Status: <None>  Medication Order Taking? Sig Documenting Provider Last Dose Status Informant  acetaminophen (TYLENOL) 325 MG tablet [id number] Yes Take 2 tablets (650 mg total) by mouth every 6 (six) hours as needed for mild pain (or Fever >/= 101). Domenic Polite, MD Taking Active   alfuzosin (UROXATRAL) 10 MG 24 hr tablet [id number] Yes Take 10 mg by mouth daily with breakfast.  [provider] Taking Active Multiple Informants  aspirin EC 81 MG tablet [id number] Yes Take 81 mg by mouth daily with breakfast.  [provider] Taking Active Multiple Informants  atorvastatin (LIPITOR) 20 MG tablet [id number] Yes TAKE 1 TABLET BY MOUTH EVERY DAY  Patient taking differently: Take 20 mg by mouth daily with breakfast.   Randy Brine, [name] Taking Active Multiple Informants  guaiFENesin (MUCINEX) 600 MG 12 hr tablet [id number] No Take 1 tablet (600 mg total) by mouth 2 (two) times daily.  Patient not taking: Reported on 03/13/2020   Randy Brine, [name] Not Taking Active Multiple Informants  metoprolol tartrate (LOPRESSOR) 50 MG tablet [id number] Yes TAKE 1 TABLET BY MOUTH EVERY DAY [place] Chard, MD Taking Active   Multiple Vitamin (MULTIVITAMIN WITH MINERALS) TABS tablet [id number] Yes Take 1 tablet by mouth daily with breakfast. [provider] Taking Active Self  MYRBETRIQ 25 MG TB24 tablet [id number] Yes TAKE 1 TABLET BY MOUTH EVERY DAY  Patient taking differently: Take 25 mg by  mouth daily with breakfast.   Randy Brine, [name] Taking Active Multiple Informants  nystatin (NYSTATIN) powder [id number] Yes Apply 1 application topically 3 (three) times daily.  Patient taking differently: Apply 1 application topically 2 (two) times daily as needed (itching/rash).   Randy Brine, [name] Taking Active Multiple Informants  RYBELSUS 14 MG TABS [id number] Yes TAKE 1 TABLET BY MOUTH DAILY. [place] Sanders, Robyn, MD Taking Active   sildenafil (REVATIO) 20 MG tablet [id number] No TAKE 1 TABLET BY MOUTH EVERY DAY (PRIOR AUTHORIZATION DENIED)  Patient not taking: Reported on 03/13/2020   [name], [place] Not Taking Active  Multiple Informants  Tetrahydrozoline HCl (VISINE OP) [id number]  Place 1 drop into both eyes daily as needed (itching/irritation). [provider]  Active Self  TRADJENTA 5 MG TABS tablet [id number]  TAKE 1 TABLET BY MOUTH EVERY DAY  Patient taking differently: Take 5 mg by mouth daily with breakfast.    Randy Brine, [name]  Active Multiple Informants  traMADol (ULTRAM) 50 MG tablet [id number] Yes Take 1 tablet (50 mg total) by mouth every 6 (six) hours as needed for severe pain. Thurnell Lose, MD Taking Active Multiple Informants           Med Note Benay Pike Feb 10, 2020  3:59 PM) #20 filled 10/07/2019  traZODone (DESYREL) 50 MG tablet [id number] Yes Take 1 tablet (50 mg total) by mouth at bedtime as needed for sleep. Randy Brine, [name] Taking Active Multiple Informants          Patient Active Problem List   Diagnosis Date Noted  . AKI (acute kidney injury) ([place]) 02/11/2020  . Acute kidney injury superimposed on chronic kidney disease ([place]) 02/10/2020  . Hydroureteronephrosis 02/10/2020  . Nausea and vomiting 02/10/2020  . History of COVID-19 02/10/2020  . Pneumonia due to COVID-19 virus 09/29/2019  . Acute on chronic kidney failure ([place]) 09/28/2019  . Hyponatremia 09/28/2019  . Sinus tachycardia 09/28/2019  .  Insomnia 08/26/2018  . Nephropathy 03/26/2018  . Urinary urgency 03/26/2018  . Type 2 diabetes mellitus with diabetic nephropathy, without long-term current use of insulin ([place]) 03/26/2018  . Essential hypertension 03/26/2018  . Chronic kidney disease (CKD), stage III (moderate) ([place]) 03/26/2018  . Conductive hearing loss, bilateral 11/07/2017  . Uncontrolled type 2 diabetes mellitus with hyperglycemia ([place]) 07/31/2016  . Dyslipidemia associated with type 2 diabetes mellitus ([place]) 05/29/2016  . Microcytic anemia 02/17/2012  . 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 Benign hypertension 02/17/2012  . Depressive disorder 02/17/2012    Immunization History  Administered Date(s) Administered  . Influenza Split 12/04/2009  . PPD Test 05/29/2016    Conditions to be addressed/monitored:  {USCCMDZASSESSMENTOPTIONS:23563}  There are no care plans that you recently modified to display for this patient.    Medication Assistance: {MEDASSISTANCEINFO:25044}  Patient's preferred pharmacy is:  [id number] - [place], [place] [address] Phone: [phone] Fax: [phone]  Uses pill box? {Yes or If no, why not?:20788} Pt endorses ***% compliance  We discussed: {Pharmacy options:24294} Patient decided to: {US Pharmacy Plan:23885}  Care Plan and Follow Up Patient Decision:  {FOLLOWUP:24991}  Plan: {CM FOLLOW UP PLAN:25073}  *** Current Barriers:  . {pharmacybarriers:24917} . ***  Pharmacist Clinical Goal(s):  Marland Kitchen Over the next *** days, patient will {PHARMACYGOALCHOICES:24921} through collaboration with PharmD and provider.  . ***  Interventions: . 1:1 collaboration with Randy Brine, [name] regarding development and update of comprehensive plan of care as evidenced by provider attestation and co-signature . Inter-disciplinary care team collaboration (see longitudinal plan of  care) . Comprehensive medication review performed; medication list updated in electronic medical record  Hypertension (BP goal {CHL HP UPSTREAM Pharmacist BP [phone]}) -{CHL Controlled/Uncontrolled:[phone]} -Current treatment: . *** -Medications previously tried: ***  -Current home readings: *** -Current dietary habits: *** -Current exercise habits: *** -{ACTIONS;DENIES/REPORTS:21021675::"Denies"} hypotensive/hypertensive symptoms -Educated on {CCM BP Counseling:25124} -Counseled to monitor BP at home ***, document, and provide log at future appointments -{CCMPHARMDINTERVENTION:25122}  Hyperlipidemia: (LDL goal < ***) -{CHL Controlled/Uncontrolled:[phone]} -Current treatment: . *** -Medications previously  tried: ***  -Current dietary patterns: *** -Current exercise habits: *** -Educated on {CCM HLD Counseling:25126} -{CCMPHARMDINTERVENTION:25122}  Diabetes (A1c goal {A1c goals:23924}) -{CHL Controlled/Uncontrolled:[phone]} -Current medications: . *** -Medications previously tried: ***  -Current home glucose readings . fasting glucose: *** . post prandial glucose: *** -{ACTIONS;DENIES/REPORTS:21021675::"Denies"} hypoglycemic/hyperglycemic symptoms -Current meal patterns:  . breakfast: ***  . lunch: ***  . dinner: *** . snacks: *** . drinks: *** -Current exercise: *** -Educated on{CCM DM COUNSELING:25123} -Counseled to check feet daily and get yearly eye exams -{CCMPHARMDINTERVENTION:25122}  *** (Goal: ***) -{CHL Controlled/Uncontrolled:[phone]} -Current treatment  . *** -Medications previously tried: ***  -{CCMPHARMDINTERVENTION:25122}  *** (Goal: ***) -{CHL Controlled/Uncontrolled:[phone]} -Current treatment  . *** -Medications previously tried: ***  -{CCMPHARMDINTERVENTION:25122}   Health Maintenance -Vaccine gaps: *** -Current therapy:  . *** -Educated on {ccm supplement counseling:25128} -{CCM Patient  satisfied:25129} -{CCMPHARMDINTERVENTION:25122}   Patient Goals/Self-Care Activities . Over the next *** days, patient will:  - {pharmacypatientgoals:24919}  Follow Up Plan: {CM FOLLOW UP LZJQ:73419}

## 2020-05-15 ENCOUNTER — Other Ambulatory Visit: Payer: Self-pay | Admitting: Nurse Practitioner

## 2020-05-15 DIAGNOSIS — N452 Orchitis: Secondary | ICD-10-CM | POA: Diagnosis not present

## 2020-05-15 DIAGNOSIS — R1030 Lower abdominal pain, unspecified: Secondary | ICD-10-CM | POA: Diagnosis not present

## 2020-05-16 ENCOUNTER — Other Ambulatory Visit: Payer: Self-pay | Admitting: Nurse Practitioner

## 2020-05-17 ENCOUNTER — Other Ambulatory Visit: Payer: Self-pay | Admitting: Nurse Practitioner

## 2020-05-18 ENCOUNTER — Other Ambulatory Visit: Payer: Self-pay

## 2020-05-18 MED ORDER — OLMESARTAN MEDOXOMIL-HCTZ 40-25 MG PO TABS
1.0000 | ORAL_TABLET | Freq: Every day | ORAL | 2 refills | Status: DC
Start: 2020-05-18 — End: 2020-08-15

## 2020-05-25 ENCOUNTER — Telehealth: Payer: Self-pay

## 2020-05-25 NOTE — Chronic Care Management (AMB) (Signed)
Chronic Care Management Pharmacy Assistant   Name: [name]  MRN: [id number] DOB: 10/22/1955   Reason for Encounter: Hypertension Adherence Call    Recent office visits:  None  Recent consult visits:  None  Hospital visits: None in previous 6 months.  Medications: Outpatient Encounter Medications as of 05/25/2020  Medication Sig Note  . acetaminophen (TYLENOL) 325 MG tablet Take 2 tablets (650 mg total) by mouth every 6 (six) hours as needed for mild pain (or Fever >/= 101).   Marland Kitchen alfuzosin (UROXATRAL) 10 MG 24 hr tablet Take 10 mg by mouth daily with breakfast.    . aspirin EC 81 MG tablet Take 81 mg by mouth daily with breakfast.    . atorvastatin (LIPITOR) 20 MG tablet Take 1 tablet (20 mg total) by mouth daily.   . metoprolol tartrate (LOPRESSOR) 50 MG tablet TAKE 1 TABLET BY MOUTH EVERY DAY   . Multiple Vitamin (MULTIVITAMIN WITH MINERALS) TABS tablet Take 1 tablet by mouth daily with breakfast.   . MYRBETRIQ 25 MG TB24 tablet TAKE 1 TABLET BY MOUTH EVERY DAY   . nystatin (NYSTATIN) powder Apply 1 application topically 3 (three) times daily. (Patient taking differently: Apply 1 application topically 2 (two) times daily as needed (itching/rash).)   . olmesartan-hydrochlorothiazide (BENICAR HCT) 40-25 MG tablet Take 1 tablet by mouth daily.   . RYBELSUS 14 MG TABS TAKE 1 TABLET BY MOUTH DAILY. 30 MINUTES BEFORE BREAKFAST   . sildenafil (REVATIO) 20 MG tablet TAKE 1 TABLET BY MOUTH EVERY DAY (PRIOR AUTHORIZATION DENIED) (Patient not taking: Reported on 03/13/2020)   . Tetrahydrozoline HCl (VISINE OP) Place 1 drop into both eyes daily as needed (itching/irritation).   . TRADJENTA 5 MG TABS tablet TAKE 1 TABLET BY MOUTH EVERY DAY (Patient taking differently: Take 5 mg by mouth daily with breakfast. )   . traMADol (ULTRAM) 50 MG tablet Take 1 tablet (50 mg total) by mouth every 6 (six) hours as needed for severe pain. 02/10/2020: #20 filled 10/07/2019  . traZODone (DESYREL) 50 MG  tablet Take 1 tablet (50 mg total) by mouth at bedtime as needed for sleep.    No facility-administered encounter medications on file as of 05/25/2020.   Reviewed chart prior to disease state call. Spoke with patient regarding BP  Recent Office Vitals: BP Readings from Last 3 Encounters:  03/13/20 136/74  02/12/20 (!) 142/88  12/13/19 126/84   Pulse Readings from Last 3 Encounters:  03/13/20 82  02/12/20 73  12/13/19 98    Wt Readings from Last 3 Encounters:  03/13/20 181 lb 9.6 oz (82.4 kg)  02/10/20 175 lb 11.3 oz (79.7 kg)  12/13/19 193 lb 6.4 oz (87.7 kg)     Kidney Function Lab Results  Component Value Date/Time   CREATININE 1.70 (H) 03/13/2020 11:39 AM   CREATININE 1.41 (H) 02/12/2020 07:46 AM   GFRNONAA 41 (L) 03/13/2020 11:39 AM   GFRNONAA 55 (L) 02/12/2020 07:46 AM   GFRAA 48 (L) 03/13/2020 11:39 AM    BMP Latest Ref Rng & Units 03/13/2020 02/12/2020 02/11/2020  Glucose 65 - 99 mg/dL 211(H) 149(H) 90  BUN 8 - 27 mg/dL 35(H) 23 36(H)  Creatinine 0.76 - 1.27 mg/dL 1.70(H) 1.41(H) 1.75(H)  BUN/Creat Ratio 10 - 24 21 - -  Sodium 134 - 144 mmol/L 137 138 137  Potassium 3.5 - 5.2 mmol/L 4.9 4.1 4.1  Chloride 96 - 106 mmol/L 99 107 107  CO2 20 - 29 mmol/L 21 19(L)  20(L)  Calcium 8.6 - 10.2 mg/dL 10.0 9.5 8.9    . Current antihypertensive regimen:  o Metoprolol Tartrate 50 MG- Take 1 tablet by mouth every day. o Olmesartan/HCTZ 40/25mg -Take 1 tablet daily.  sildenafil (REVATIO) 20 MG tablet-Take 1 tablet daily.  . How often are you checking your Blood Pressure? daily   . Current home BP readings:  - Patient reports blood pressure is running at 160/85.  . What recent interventions/DTPs have been made by any provider to improve Blood Pressure control since last CPP Visit:  - Patient reports he is taking his medications as directed.  . Any recent hospitalizations or ED visits since last visit with CPP? No   . What diet changes have been made to improve Blood  Pressure Control?  o Patient stopped eating salt and fatty foods and eating more vegetables. Patient states he is drinking 5 glasses of water.   . What exercise is being done to improve your Blood Pressure Control?  o Patient reports he does some walking on his treadmill every other a day for about 20 minutes.  Adherence Review: Is the patient currently on ACE/ARB medication? No Does the patient have >5 day gap between last estimated fill dates? No   Star Rating Drugs: Atorvastatin 20 MG: #90 DS, last filled on 03/13/20 at [facility].  05/25/20-Attempted to outreach the patient regarding his blood pressure monitoring. No answer, left a voicemail.  05/26/20-Second attempt to outreach the patient regarding his blood pressure monitoring. No answer. Left a voicemail.  05/29/20-Successful outreach to the patient regarding his blood pressure monitoring. The patient reported he has some concerns with his blood pressure that occasionally elevates when his girlfriend attends his house over the weekends. Patient stated he has been taking 4 tablets of a generic Viagra 20 mg when being sexually active with his girlfriend. Patient also voiced he noticed his blood pressure has been elevating while being sexually active as well. Patient voiced he will like to speak with Orlando Penner, CPP to determine if this is a normal situation. I was able to find availability for this month for the patient to speak with Orlando Penner, CPP. The patient confirmed June 07, 2020 at 1:45 PM for a CCM Call visit follow-up appointment.  Orlando Penner, CPP Notified.  Follow-up:Pharmacist Review   Raynelle Highland, [place] Pharmacist Assistant [phone] CCM Total Time: 31 minutes

## 2020-06-05 ENCOUNTER — Other Ambulatory Visit: Payer: Self-pay | Admitting: Nurse Practitioner

## 2020-06-05 DIAGNOSIS — G4700 Insomnia, unspecified: Secondary | ICD-10-CM

## 2020-06-05 NOTE — Telephone Encounter (Signed)
Please refill patient's prescription YL,RMA 

## 2020-06-06 ENCOUNTER — Telehealth: Payer: Self-pay

## 2020-06-06 ENCOUNTER — Ambulatory Visit (INDEPENDENT_AMBULATORY_CARE_PROVIDER_SITE_OTHER): Payer: Medicare Other

## 2020-06-06 DIAGNOSIS — E1121 Type 2 diabetes mellitus with diabetic nephropathy: Secondary | ICD-10-CM

## 2020-06-06 DIAGNOSIS — I10 Essential (primary) hypertension: Secondary | ICD-10-CM

## 2020-06-06 DIAGNOSIS — N1832 Chronic kidney disease, stage 3b: Secondary | ICD-10-CM

## 2020-06-06 NOTE — Chronic Care Management (AMB) (Signed)
Chronic Care Management    Social Work Note  06/06/2020 Name: [name] MRN: [id number] DOB: 11-02-1955  [name] is a 65 y.o. year old male who is a primary care patient of [name], [place]. The CCM team was consulted to assist the patient with chronic disease management and/or care coordination needs related to: Level of Care Concerns.   Engaged with patient by telephone for follow up visit in response to provider referral for social work chronic care management and care coordination services.   Consent to Services:  The patient was given information about Chronic Care Management services, agreed to services, and gave verbal consent prior to initiation of services.  Please see initial visit note for detailed documentation.   Patient agreed to services and consent obtained.   Assessment: Review of patient past medical history, allergies, medications, and health status, including review of relevant consultants reports was performed today as part of a comprehensive evaluation and provision of chronic care management and care coordination services.     SDOH (Social Determinants of Health) assessments and interventions performed:    Advanced Directives Status: Not addressed in this encounter.  CCM Care Plan  No Known Allergies  Outpatient Encounter Medications as of 06/06/2020  Medication Sig Note  . acetaminophen (TYLENOL) 325 MG tablet Take 2 tablets (650 mg total) by mouth every 6 (six) hours as needed for mild pain (or Fever >/= 101).   Marland Kitchen alfuzosin (UROXATRAL) 10 MG 24 hr tablet Take 10 mg by mouth daily with breakfast.    . aspirin EC 81 MG tablet Take 81 mg by mouth daily with breakfast.    . atorvastatin (LIPITOR) 20 MG tablet Take 1 tablet (20 mg total) by mouth daily.   . metoprolol tartrate (LOPRESSOR) 50 MG tablet TAKE 1 TABLET BY MOUTH EVERY DAY   . Multiple Vitamin (MULTIVITAMIN WITH MINERALS) TABS tablet Take 1 tablet by mouth daily with breakfast.   . MYRBETRIQ 25  MG TB24 tablet TAKE 1 TABLET BY MOUTH EVERY DAY   . nystatin (NYSTATIN) powder Apply 1 application topically 3 (three) times daily. (Patient taking differently: Apply 1 application topically 2 (two) times daily as needed (itching/rash).)   . olmesartan-hydrochlorothiazide (BENICAR HCT) 40-25 MG tablet Take 1 tablet by mouth daily.   . RYBELSUS 14 MG TABS TAKE 1 TABLET BY MOUTH DAILY. 30 MINUTES BEFORE BREAKFAST   . sildenafil (REVATIO) 20 MG tablet TAKE 1 TABLET BY MOUTH EVERY DAY (PRIOR AUTHORIZATION DENIED) (Patient not taking: Reported on 03/13/2020)   . Tetrahydrozoline HCl (VISINE OP) Place 1 drop into both eyes daily as needed (itching/irritation).   . TRADJENTA 5 MG TABS tablet TAKE 1 TABLET BY MOUTH EVERY DAY (Patient taking differently: Take 5 mg by mouth daily with breakfast. )   . traMADol (ULTRAM) 50 MG tablet Take 1 tablet (50 mg total) by mouth every 6 (six) hours as needed for severe pain. 02/10/2020: #20 filled 10/07/2019  . traZODone (DESYREL) 50 MG tablet TAKE 1 TABLET (50 MG TOTAL) BY MOUTH AT BEDTIME AS NEEDED FOR SLEEP.    No facility-administered encounter medications on file as of 06/06/2020.    Patient Active Problem List   Diagnosis Date Noted  . AKI (acute kidney injury) ([place]) 02/11/2020  . Acute kidney injury superimposed on chronic kidney disease ([place]) 02/10/2020  . Hydroureteronephrosis 02/10/2020  . Nausea and vomiting 02/10/2020  . History of COVID-19 02/10/2020  . Pneumonia due to COVID-19 virus 09/29/2019  . Acute on chronic kidney  failure ([place]) 09/28/2019  . Hyponatremia 09/28/2019  . Sinus tachycardia 09/28/2019  . Insomnia 08/26/2018  . Nephropathy 03/26/2018  . Urinary urgency 03/26/2018  . Type 2 diabetes mellitus with diabetic nephropathy, without long-term current use of insulin ([place]) 03/26/2018  . Essential hypertension 03/26/2018  . Chronic kidney disease (CKD), stage III (moderate) ([place]) 03/26/2018  . Conductive hearing loss, bilateral 11/07/2017   . Uncontrolled type 2 diabetes mellitus with hyperglycemia ([place]) 07/31/2016  . Dyslipidemia associated with type 2 diabetes mellitus ([place]) 05/29/2016  . Microcytic anemia 02/17/2012  . 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 Benign hypertension 02/17/2012  . Depressive disorder 02/17/2012    Conditions to be addressed/monitored: DMII and CKD Stage III; Level of care concerns  Care Plan : Social Work SDOH Plan of Care  Updates made by [name] since 06/06/2020 12:00 AM  Completed 06/06/2020  Problem: Long-Term Care Planning Resolved 06/06/2020    Long-Range Goal: Effective Long-Term Care Planning Completed 06/06/2020  Start Date: 03/30/2020  Expected End Date: 07/28/2020  Priority: Medium  Note:   Current Barriers:  . Limited social support . Level of care concerns . Chronic conditions including CKD III and DM II which put patient at increased risk of hospitalization  Social Work Clinical Goal(s):  Marland Kitchen Over the next 120 days, patient will work with SW to address concerns related to long term care planning  Interventions: . 1:1 collaboration with [name], [place] regarding development and update of comprehensive plan of care as evidenced by provider attestation and co-signature . Inter-disciplinary care team collaboration (see longitudinal plan of care) . Successful outbound call placed to the patient to follow up on long term care planning needs . Discussed the patient remains on the wait list for [facility] . Assessed for acute placement needs - patient declines stating he is doing well in the home at this time . No follow up SW needs identified  Patient Goals/Self-Care Activities   - Patient will self administer medications as prescribed Patient will attend all scheduled provider appointments Patient will call provider office for new concerns or questions Maintain contact with [facility] to follow up on bed availability Contact SW as needed with future SW  needs  Follow up Plan: No SW follow up planned at this time. The patient will remain active with RN Care Manager and embedded PharmD.       Follow Up Plan: No SW follow up planned at this time. The patient will remain engaged with RN Care Manager and embedded PharmD.      [name], BSW, CDP Social Worker, Certified Dementia Practitioner [place] [facility] [place] Management [phone]  Total time spent performing care coordination and/or care management activities with the patient by phone or face to face = 20 minutes.

## 2020-06-06 NOTE — Progress Notes (Signed)
06/06/20-Called and spoke with the patient to remind him of CCM Call Visit appointment tomorrow 06/07/20 at 1:45 PM with Orlando Penner, CPP. I informed the patient to have medications and supplements near during phone visit. The patient voiced understanding.   Orlando Penner, CPP Notified.  Raynelle Highland, [place] Pharmacist Assistant [phone] CCM Total Time: 11 minutes

## 2020-06-06 NOTE — Patient Instructions (Signed)
Social Worker Visit Information  Goals we discussed today:  Goals Addressed            This Visit's Progress   . COMPLETED: Identify long term care plan       Timeframe:  Long-Range Goal Priority:  Medium Start Date:  1.13.22                           Expected End Date: 5.13.22                      Goal met 3.22.22  Patient will self administer medications as prescribed Patient will attend all scheduled provider appointments Patient will call provider office for new concerns or questions Maintain contact with [facility] to follow up on bed availability Contact SW as needed         Follow Up Plan: No SW follow up planned at this time   [name], BSW, CDP Social Worker, Certified Dementia Practitioner [facility] [phone]

## 2020-06-07 ENCOUNTER — Ambulatory Visit: Payer: Medicare Other

## 2020-06-07 DIAGNOSIS — I10 Essential (primary) hypertension: Secondary | ICD-10-CM

## 2020-06-07 DIAGNOSIS — E785 Hyperlipidemia, unspecified: Secondary | ICD-10-CM | POA: Diagnosis not present

## 2020-06-07 DIAGNOSIS — E1169 Type 2 diabetes mellitus with other specified complication: Secondary | ICD-10-CM

## 2020-06-07 DIAGNOSIS — E1121 Type 2 diabetes mellitus with diabetic nephropathy: Secondary | ICD-10-CM

## 2020-06-07 NOTE — Progress Notes (Unsigned)
Chronic Care Management Pharmacy Note  06/15/2020 Name:  [name] MRN:  [id number] DOB:  April 27, 1955  Subjective: [name] is an 65 y.o. year old male who is a primary patient of [name], [place].  The CCM team was consulted for assistance with disease management and care coordination needs.  Patient reports that his right kidney is only functioning at 5% and his left kidney is functioning at 90%. He reports that he does a lot of walking.   Engaged with patient by telephone for follow up visit in response to provider referral for pharmacy case management and/or care coordination services.   Consent to Services:  The patient was given information about Chronic Care Management services, agreed to services, and gave verbal consent prior to initiation of services.  Please see initial visit note for detailed documentation.   Patient Care Team: [name], [place] as PCP - General ([place]) Rex Kras, Claudette Stapler, RN as Case Manager [name] as Social Worker Mayford Knife, [facility] (Pharmacist)  Recent office visits:  03/13/2020 PCP OV- atorvastatin 20 mg tablet daily   Recent consult visits: 03/22/2020 Opthamology consult for [facility] visits: 02/10/2020 Hospital Visit for acute kidney injury    Objective:  Lab Results  Component Value Date   CREATININE 1.70 (H) 03/13/2020   BUN 35 (H) 03/13/2020   GFRNONAA 41 (L) 03/13/2020   GFRAA 48 (L) 03/13/2020   NA 137 03/13/2020   K 4.9 03/13/2020   CALCIUM 10.0 03/13/2020   CO2 21 03/13/2020   GLUCOSE 211 (H) 03/13/2020    Lab Results  Component Value Date/Time   HGBA1C 7.2 (H) 03/13/2020 11:39 AM   HGBA1C 7.7 (H) 12/13/2019 03:13 PM   HGBA1C 7.6 06/26/2016 12:00 AM   HGBA1C 7.6 06/26/2016 12:00 AM   MICROALBUR 80 08/26/2019 03:15 PM   MICROALBUR 150 03/26/2018 11:57 AM    Last diabetic Eye exam: No results found for: HMDIABEYEEXA  Last diabetic Foot exam: No results found for: HMDIABFOOTEX    Lab Results  Component Value Date   CHOL 198 03/13/2020   HDL 38 (L) 03/13/2020   LDLCALC 115 (H) 03/13/2020   TRIG 255 (H) 03/13/2020   CHOLHDL 5.2 (H) 03/13/2020    Hepatic Function Latest Ref Rng & Units 02/10/2020 12/13/2019 10/05/2019  Total Protein 6.5 - 8.1 g/dL 6.8 7.3 6.5  Albumin 3.5 - 5.0 g/dL 3.9 4.5 3.0(L)  AST 15 - 41 U/L _0 ALT 0 - 44 U/L 18 33 53(H)  Alk Phosphatase 38 - 126 U/L 74 137(H) 86  Total Bilirubin 0.3 - 1.2 mg/dL 1.3(H) 0.3 0.8    Lab Results  Component Value Date/Time   TSH 0.146 (L) 09/28/2019 02:32 PM   TSH 1.461 Test methodology is 3rd generation TSH 04/19/2008 04:30 AM   FREET4 0.85 (L) 04/19/2008 04:30 AM    CBC Latest Ref Rng & Units 02/11/2020 02/10/2020 10/05/2019  WBC 4.0 - 10.5 K/uL 7.0 7.7 11.2(H)  Hemoglobin 13.0 - 17.0 g/dL 16.1 17.0 16.6  Hematocrit 39.0 - 52.0 % 49.5 52.6(H) 51.3  Platelets 150 - 400 K/uL 169 171 276    Lab Results  Component Value Date/Time   VD25OH 56 08/18/2008 09:21 PM    Clinical ASCVD: No  The 10-year ASCVD risk score Mikey Bussing DC Jr., et al., 2013) is: 35.2%   Values used to calculate the score:     Age: 21 years     Sex: Male  Is Non-Hispanic African American: No     Diabetic: Yes     Tobacco smoker: No     Systolic Blood Pressure: 109 mmHg     Is BP treated: Yes     HDL Cholesterol: 38 mg/dL     Total Cholesterol: 198 mg/dL    Depression screen [facility] 2/9 08/26/2019 02/02/2019 10/12/2018  Decreased Interest 0 0 3  Down, Depressed, Hopeless 2 0 1  PHQ - 2 Score 2 0 4  Altered sleeping 2 - 0  Tired, decreased energy 0 - 3  Change in appetite 0 - 0  Feeling bad or failure about yourself  0 - 0  Trouble concentrating 0 - 0  Moving slowly or fidgety/restless 0 - 0  Suicidal thoughts 0 - 0  PHQ-9 Score 4 - 7  Difficult doing work/chores Not difficult at all - Not difficult at all  Some recent data might be hidden      Social History   Tobacco Use  Smoking Status Never Smoker   Smokeless Tobacco Never Used   BP Readings from Last 3 Encounters:  03/13/20 136/74  02/12/20 (!) 142/88  12/13/19 126/84   Pulse Readings from Last 3 Encounters:  03/13/20 82  02/12/20 73  12/13/19 98   Wt Readings from Last 3 Encounters:  03/13/20 181 lb 9.6 oz (82.4 kg)  02/10/20 175 lb 11.3 oz (79.7 kg)  12/13/19 193 lb 6.4 oz (87.7 kg)   BMI Readings from Last 3 Encounters:  03/13/20 29.31 kg/m  02/10/20 28.36 kg/m  12/13/19 31.22 kg/m    Assessment/Interventions: Review of patient past medical history, allergies, medications, health status, including review of consultants reports, laboratory and other test data, was performed as part of comprehensive evaluation and provision of chronic care management services.   SDOH:  (Social Determinants of Health) assessments and interventions performed: No   CCM Care Plan  No Known Allergies  Medications Reviewed Today    Reviewed by Mayford Knife, RPH (Pharmacist) on 06/07/20 at 1417  Med List Status: <None>  Medication Order Taking? Sig Documenting Provider Last Dose Status Informant  acetaminophen (TYLENOL) 325 MG tablet [id number] Yes Take 2 tablets (650 mg total) by mouth every 6 (six) hours as needed for mild pain (or Fever >/= 101). Domenic Polite, MD Taking Active   alfuzosin (UROXATRAL) 10 MG 24 hr tablet [id number] Yes Take 10 mg by mouth daily with breakfast.  [provider] Taking Active Multiple Informants  aspirin EC 81 MG tablet [id number] Yes Take 81 mg by mouth daily with breakfast.  [provider] Taking Active Multiple Informants  atorvastatin (LIPITOR) 20 MG tablet [id number] Yes Take 1 tablet (20 mg total) by mouth daily. [name], [name] Taking Active   metoprolol tartrate (LOPRESSOR) 50 MG tablet [id number] Yes TAKE 1 TABLET BY MOUTH EVERY DAY [place] Chard, MD Taking Active   Multiple Vitamin (MULTIVITAMIN WITH MINERALS) TABS tablet [id number] Yes Take 1 tablet by mouth daily with  breakfast. [provider] Taking Active Self  MYRBETRIQ 25 MG TB24 tablet [id number] Yes TAKE 1 TABLET BY MOUTH EVERY DAY [name], [name] Taking Active   nystatin (NYSTATIN) powder [id number] Yes Apply 1 application topically 3 (three) times daily.  Patient taking differently: Apply 1 application topically 2 (two) times daily as needed (itching/rash).   [name], [name] Taking Active   olmesartan-hydrochlorothiazide [facility] HCT) 40-25 MG tablet [id number] Yes Take 1 tablet by mouth daily. [name], [name] Taking Active   RYBELSUS 14 MG  TABS [id number] Yes TAKE 1 TABLET BY MOUTH DAILY. [place] Sanders, Robyn, MD Taking Active   sildenafil (REVATIO) 20 MG tablet [id number] Yes TAKE 1 TABLET BY MOUTH EVERY DAY (PRIOR AUTHORIZATION DENIED) [name], [name] Taking Active   Tetrahydrozoline HCl (VISINE OP) [id number] Yes Place 1 drop into both eyes daily as needed (itching/irritation). [provider] Taking Active Self  TRADJENTA 5 MG TABS tablet [id number] Yes TAKE 1 TABLET BY MOUTH EVERY DAY  Patient taking differently: Take 5 mg by mouth daily with breakfast.   [name], [name] Taking Active Multiple Informants  traMADol (ULTRAM) 50 MG tablet [id number] Yes Take 1 tablet (50 mg total) by mouth every 6 (six) hours as needed for severe pain. Thurnell Lose, MD Taking Active Multiple Informants           Med Note Benay Pike Feb 10, 2020  3:59 PM) #20 filled 10/07/2019  traZODone (DESYREL) 50 MG tablet [id number] Yes TAKE 1 TABLET (50 MG TOTAL) BY MOUTH AT BEDTIME AS NEEDED FOR SLEEP. [name], [name] Taking Active           Patient Active Problem List   Diagnosis Date Noted  . AKI (acute kidney injury) ([place]) 02/11/2020  . Acute kidney injury superimposed on chronic kidney disease ([place]) 02/10/2020  . Hydroureteronephrosis 02/10/2020  . Nausea and vomiting 02/10/2020  . History of COVID-19 02/10/2020  . Pneumonia due to COVID-19 virus  09/29/2019  . Acute on chronic kidney failure ([place]) 09/28/2019  . Hyponatremia 09/28/2019  . Sinus tachycardia 09/28/2019  . Insomnia 08/26/2018  . Nephropathy 03/26/2018  . Urinary urgency 03/26/2018  . Type 2 diabetes mellitus with diabetic nephropathy, without long-term current use of insulin ([place]) 03/26/2018  . Essential hypertension 03/26/2018  . Chronic kidney disease (CKD), stage III (moderate) ([place]) 03/26/2018  . Conductive hearing loss, bilateral 11/07/2017  . Uncontrolled type 2 diabetes mellitus with hyperglycemia ([place]) 07/31/2016  . Dyslipidemia associated with type 2 diabetes mellitus ([place]) 05/29/2016  . Microcytic anemia 02/17/2012  . 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 Benign hypertension 02/17/2012  . Depressive disorder 02/17/2012    Immunization History  Administered Date(s) Administered  . Influenza Split 12/04/2009  . PPD Test 05/29/2016    Conditions to be addressed/monitored:  Hypertension, Hyperlipidemia and Diabetes  Care Plan : [place]  Updates made by Mayford Knife, RPH since 06/15/2020 12:00 AM    Problem: HTN, HLD, DM II   Priority: High    Long-Range Goal: Disease Management   This Visit's Progress: On track  Priority: High  Note:    Current Barriers:  . Unable to achieve control of Diabetes    Pharmacist Clinical Goal(s):  Marland Kitchen Patient will achieve adherence to monitoring guidelines and medication adherence to achieve therapeutic efficacy through collaboration with PharmD and provider.   Interventions: . 1:1 collaboration with [name], [name] regarding development and update of comprehensive plan of care as evidenced by provider attestation and co-signature . Inter-disciplinary care team collaboration (see longitudinal plan of care) . Comprehensive medication review performed; medication list updated in electronic medical record  Hypertension (BP goal <130/80) -Uncontrolled -Current treatment: . Metoprolol  Tartrate 50 mg tablet once a day  . Olmesartan - Hydrochlorothiazide 40-25 mg tablet once per day  o Patient ran out of medication  - Filled BP medication about a week ago.  -Current home readings: patient reports his BP is usually <130/80 -Current dietary habits: Patient is not  eating as much fast food, he is buying fresh vegetables, steak, fish or chicken breast  -Current exercise habits:walks about a mile every day  -Denies hypotensive/hypertensive symptoms -Educated on Proper BP monitoring technique; -Counseled to monitor BP at home at least once per day, document, and provide log at future appointments -Counseled on diet and exercise extensively Recommended to continue current medication  Hyperlipidemia: (LDL goal < 70) -Uncontrolled -Current treatment: . Atorvastatin 20 mg tablet once per day o Patient reports that he does not take every day, he is going to try to take his medicine every day, he has 45 pills left.  -Current exercise habits: walks about a mile every day  -Educated on Cholesterol goals;  Benefits of statin for ASCVD risk reduction; Importance of limiting foods high in cholesterol; Exercise goal of 150 minutes per week; -Recommended to continue current medication Recommended patient place a reminder system for him to take his medication.   Diabetes (A1c goal <7%) -Uncontrolled -Current medications: Marland Kitchen Rybelsus 14 mg  Taking 1 tablet by mouth daily . Tradjenta 5 mg Taking 1 tablet by mouth daily  -Current home glucose readings . post prandial glucose:110-140  -Denies hypoglycemic/hyperglycemic symptoms -Current meal patterns:  . breakfast: bowl of cornflakes, cheerios or an egg sandwich with a bowl of grits  . lunch: Mcchicken sandwich with a bit of lettuce  . dinner: sometimes steak,chicken breast, slaw/salad or a piece of fish, sometimes a protein shake  . snacks: patient reports that he is eating more healthy . drinks: water, and in the evening a protein  shake, unsweet orange, grapefruit juice, unsweet tea -Current exercise: does a lot of walking around  -Educated on A1c and blood sugar goals; Complications of diabetes including kidney damage, retinal damage, and cardiovascular disease; Exercise goal of 150 minutes per week; Prevention and management of hypoglycemic episodes; Benefits of routine self-monitoring of blood sugar; Carbohydrate counting and/or plate method -Counseled to check feet daily and get yearly eye exams -Counseled on diet and exercise extensively Recommended to continue current medication  Health Maintenance -Vaccine gaps: COVID -19 ( patient is not fully vaccinated), Pneumovax, Prevnar   -Educated on the importance of vaccines. -Patient is satisfied with current therapy and denies issues -Recommended patient have vaccinations completed.    Patient Goals/Self-Care Activities . Patient will:  - focus on medication adherence by taking it at the same time each day.   Follow Up Plan: Telephone follow up appointment with care management team member scheduled for: The patient has been provided with contact information for the care management team and has been advised to call with any health related questions or concerns.       Medication Assistance: None required.  Patient affirms current coverage meets needs.  Patient's preferred pharmacy is:  [id number] [place] [id number]: [phone]: [phone] Uses pill box? Yes Pt endorses 80% compliance  We discussed: Benefits of medication synchronization, packaging and delivery as well as enhanced pharmacist oversight with [employer]. Patient decided to: Continue current medication management strategy  Care Plan and Follow Up Patient Decision:  Patient agrees to Care Plan and Follow-up.  Plan: Telephone follow up appointment with care management  team member scheduled for:  09/05/2020.  VOrlando Penner PharmD Clinical Pharmacist [facility] [phone]

## 2020-06-12 ENCOUNTER — Ambulatory Visit: Payer: Medicare Other | Admitting: Nurse Practitioner

## 2020-06-14 ENCOUNTER — Telehealth: Payer: Medicare Other

## 2020-06-15 NOTE — Patient Instructions (Addendum)
Visit Information It was great speaking with you today!  Please let me know if you have any questions about our visit.  Goals Addressed            This Visit's Progress   . Manage My Medicine       Timeframe:  Long-Range Goal Priority:  High Start Date:                             Expected End Date:                       Follow Up Date 09/05/2020   - call for medicine refill 2 or 3 days before it runs out - call if I am sick and can't take my medicine - keep a list of all the medicines I take; vitamins and herbals too - use a pillbox to sort medicine - use an alarm clock or phone to remind me to take my medicine    Why is this important?   . These steps will help you keep on track with your medicines.        Current Barriers:   Unable to achieve control of Diabetes    Pharmacist Clinical Goal(s):   Patient will achieve adherence to monitoring guidelines and medication adherence to achieve therapeutic efficacy through collaboration with PharmD and provider.   Interventions:  1:1 collaboration with [name], [name] regarding development and update of comprehensive plan of care as evidenced by provider attestation and co-signature  Inter-disciplinary care team collaboration (see longitudinal plan of care)  Comprehensive medication review performed; medication list updated in electronic medical record  Hypertension (BP goal <130/80) -Uncontrolled -Current treatment:  Metoprolol Tartrate 50 mg tablet once a day   Olmesartan - Hydrochlorothiazide 40-25 mg tablet once per day  ? Patient ran out of medication   Filled BP medication about a week ago.  -Current home readings: patient reports his BP is usually <130/80 -Current dietary habits: Patient is not eating as much fast food, he is buying fresh vegetables, steak, fish or chicken breast  -Current exercise habits:walks about a mile every day  -Denies hypotensive/hypertensive symptoms -Educated on Proper BP  monitoring technique; -Counseled to monitor BP at home at least once per day, document, and provide log at future appointments -Counseled on diet and exercise extensively Recommended to continue current medication  Hyperlipidemia: (LDL goal < 70) -Uncontrolled -Current treatment:  Atorvastatin 20 mg tablet once per day ? Patient reports that he does not take every day, he is going to try to take his medicine every day, he has 45 pills left.  -Current exercise habits: walks about a mile every day  -Educated on Cholesterol goals;  Benefits of statin for ASCVD risk reduction; Importance of limiting foods high in cholesterol; Exercise goal of 150 minutes per week; -Recommended to continue current medication Recommended patient place a reminder system for him to take his medication.   Diabetes (A1c goal <7%) -Uncontrolled -Current medications:  Rybelsus 14 mg  Taking 1 tablet by mouth daily  Tradjenta 5 mg Taking 1 tablet by mouth daily  -Current home glucose readings  post prandial glucose:110-140  -Denies hypoglycemic/hyperglycemic symptoms -Current meal patterns:   breakfast: bowl of cornflakes, cheerios or an egg sandwich with a bowl of grits   lunch: Mcchicken sandwich with a bit of lettuce   dinner: sometimes steak,chicken breast, slaw/salad or a piece of fish, sometimes a  protein shake   snacks: patient reports that he is eating more healthy  drinks: water, and in the evening a protein shake, unsweet orange, grapefruit juice, unsweet tea -Current exercise: does a lot of walking around  -Educated on A1c and blood sugar goals; Complications of diabetes including kidney damage, retinal damage, and cardiovascular disease; Exercise goal of 150 minutes per week; Prevention and management of hypoglycemic episodes; Benefits of routine self-monitoring of blood sugar; Carbohydrate counting and/or plate method -Counseled to check feet daily and get yearly eye  exams -Counseled on diet and exercise extensively Recommended to continue current medication  Health Maintenance -Vaccine gaps: COVID -19 ( patient is not fully vaccinated), Pneumovax, Prevnar   -Educated on the importance of vaccines. -Patient is satisfied with current therapy and denies issues -Recommended patient have vaccinations completed.     Patient agreed to services and verbal consent obtained.   The patient verbalized understanding of instructions, educational materials, and care plan provided today and agreed to receive a mailed copy of patient instructions, educational materials, and care plan.   Orlando Penner, PharmD Clinical Pharmacist [facility] [phone]

## 2020-06-26 ENCOUNTER — Telehealth: Payer: Self-pay

## 2020-06-26 NOTE — Telephone Encounter (Signed)
Patient called to confirm his appt on 04/18 at 11:30[facility]

## 2020-07-03 ENCOUNTER — Ambulatory Visit: Payer: Medicare Other | Admitting: Nurse Practitioner

## 2020-07-05 DIAGNOSIS — L84 Corns and callosities: Secondary | ICD-10-CM | POA: Diagnosis not present

## 2020-07-05 DIAGNOSIS — I739 Peripheral vascular disease, unspecified: Secondary | ICD-10-CM | POA: Diagnosis not present

## 2020-07-05 DIAGNOSIS — E1151 Type 2 diabetes mellitus with diabetic peripheral angiopathy without gangrene: Secondary | ICD-10-CM | POA: Diagnosis not present

## 2020-07-05 DIAGNOSIS — L603 Nail dystrophy: Secondary | ICD-10-CM | POA: Diagnosis not present

## 2020-07-07 DIAGNOSIS — L570 Actinic keratosis: Secondary | ICD-10-CM | POA: Diagnosis not present

## 2020-07-07 DIAGNOSIS — L559 Sunburn, unspecified: Secondary | ICD-10-CM | POA: Diagnosis not present

## 2020-07-07 DIAGNOSIS — L280 Lichen simplex chronicus: Secondary | ICD-10-CM | POA: Diagnosis not present

## 2020-07-07 DIAGNOSIS — Z85828 Personal history of other malignant neoplasm of skin: Secondary | ICD-10-CM | POA: Diagnosis not present

## 2020-07-07 DIAGNOSIS — L503 Dermatographic urticaria: Secondary | ICD-10-CM | POA: Diagnosis not present

## 2020-07-17 ENCOUNTER — Other Ambulatory Visit: Payer: Self-pay

## 2020-07-17 ENCOUNTER — Encounter: Payer: Self-pay | Admitting: Nurse Practitioner

## 2020-07-17 ENCOUNTER — Ambulatory Visit (INDEPENDENT_AMBULATORY_CARE_PROVIDER_SITE_OTHER): Payer: Medicare Other | Admitting: Nurse Practitioner

## 2020-07-17 VITALS — BP 130/78 | HR 62 | Temp 98.6°F | Ht 66.0 in | Wt 184.2 lb

## 2020-07-17 DIAGNOSIS — I10 Essential (primary) hypertension: Secondary | ICD-10-CM | POA: Diagnosis not present

## 2020-07-17 DIAGNOSIS — E1169 Type 2 diabetes mellitus with other specified complication: Secondary | ICD-10-CM

## 2020-07-17 DIAGNOSIS — E1121 Type 2 diabetes mellitus with diabetic nephropathy: Secondary | ICD-10-CM | POA: Diagnosis not present

## 2020-07-17 DIAGNOSIS — I129 Hypertensive chronic kidney disease with stage 1 through stage 4 chronic kidney disease, or unspecified chronic kidney disease: Secondary | ICD-10-CM | POA: Diagnosis not present

## 2020-07-17 DIAGNOSIS — E785 Hyperlipidemia, unspecified: Secondary | ICD-10-CM

## 2020-07-17 DIAGNOSIS — N1831 Chronic kidney disease, stage 3a: Secondary | ICD-10-CM

## 2020-07-17 DIAGNOSIS — N4830 Priapism, unspecified: Secondary | ICD-10-CM

## 2020-07-17 MED ORDER — RYBELSUS 14 MG PO TABS: 1.0000 | ORAL_TABLET | Freq: Every day | ORAL | 1 refills | Status: DC

## 2020-07-17 MED ORDER — CLOTRIMAZOLE-BETAMETHASONE 1-0.05 % EX CREA: 1.0000 "application " | TOPICAL_CREAM | Freq: Two times a day (BID) | CUTANEOUS | 2 refills | Status: DC

## 2020-07-17 NOTE — Progress Notes (Signed)
I,Yamilka Roman [employer] as a Education administrator for [employer], [name].,have documented all relevant documentation on the behalf of [name], [name],as directed by  [name], [name] while in the presence of [name], [place]. This visit occurred during the SARS-CoV-2 public health emergency.  Safety protocols were in place, including screening questions prior to the visit, additional usage of staff PPE, and extensive cleaning of exam room while observing appropriate contact time as indicated for disinfecting solutions.  Subjective:     Patient ID: [name] , male    DOB: 1956/03/07 , 65 y.o.   MRN: [id number]   Chief Complaint  Patient presents with  . Diabetes  . Hypertension  . Depression    HPI  Patient here for a f/u on his diabetes and blood pressure. He states " I feel down and depressed with me and my lady friend". Denies suicidal ideations. He has not taken any medication for depression in quite some time.  Reports having burning sensation to his scrotal area. He goes to the tanning bed 4 days a week, states he wears underwear.  Reports having an episode where he had an erection after his significant other left for quite some time, this was uncomfortable   Wt Readings from Last 3 Encounters: 07/17/20 : 184 lb 3.2 oz (83.6 kg) 03/13/20 : 181 lb 9.6 oz (82.4 kg) 02/10/20 : 175 lb 11.3 oz (79.7 kg)  Diabetes He presents for his follow-up diabetic visit. He has type 2 diabetes mellitus. His disease course has been stable. There are no hypoglycemic associated symptoms. Pertinent negatives for hypoglycemia include no dizziness, headaches or nervousness/anxiousness. Pertinent negatives for diabetes include no chest pain, no fatigue, no polydipsia, no polyphagia and no polyuria. There are no hypoglycemic complications. Symptoms are worsening. There are no diabetic complications. Risk factors for coronary artery disease include obesity, male sex, sedentary lifestyle and diabetes mellitus.  Current diabetic treatment includes oral agent (dual therapy) (tolerating Rybelsus and Tradjenta well. ). He is compliant with treatment all of the time. He is following a generally unhealthy diet. When asked about meal planning, he reported none. He has not had a previous visit with a dietitian. He rarely participates in exercise. His home blood glucose trend is decreasing steadily. (Last time he checked his blood sugar was 140 has been up to 180 a few times ) An ACE inhibitor/angiotensin II receptor blocker is being taken. He does not see a podiatrist.Eye exam is not current.  Hypertension This is a chronic problem. The current episode started more than 1 year ago. The problem is controlled. Pertinent negatives include no chest pain, headaches or palpitations.     Past Medical History:  Diagnosis Date  . Closed nondisplaced spiral fracture of shaft of right tibia   . Complicated urinary tract infection 09/06/2016  . Depression   . Diabetes mellitus without complication ([place])   . GERD (gastroesophageal reflux disease)   . Gout    right foot  . Hemorrhoids   . Hypertension   . Hypertension   . Macular degeneration   . Microcytic anemia   . Prostate cancer ([place]) 08/2009  . Rectal bleeding 07/31/2016  . Tubular adenoma      Family History  Problem Relation Age of Onset  . Lung cancer Mother        Deceased  . Throat cancer Brother   . Pancreatic cancer Father   . Heart disease Father        Deceased  . Lung cancer  Maternal Uncle        nephew  . Diabetes Other      Current Outpatient Medications:  .  acetaminophen (TYLENOL) 325 MG tablet, Take 2 tablets (650 mg total) by mouth every 6 (six) hours as needed for mild pain (or Fever >/= 101)., Disp: , Rfl:  .  aspirin EC 81 MG tablet, Take 81 mg by mouth daily with breakfast. , Disp: , Rfl:  .  atorvastatin (LIPITOR) 20 MG tablet, Take 1 tablet (20 mg total) by mouth daily., Disp: 90 tablet, Rfl: 1 .  clotrimazole-betamethasone  (LOTRISONE) cream, Apply 1 application topically 2 (two) times daily. (Patient taking differently: Apply 1 application topically 2 (two) times daily as needed (rash/itching).), Disp: 30 g, Rfl: 2 .  MYRBETRIQ 25 MG TB24 tablet, TAKE 1 TABLET BY MOUTH EVERY DAY (Patient taking differently: Take 25 mg by mouth daily.), Disp: 90 tablet, Rfl: 1 .  nystatin (NYSTATIN) powder, Apply 1 application topically 3 (three) times daily. (Patient taking differently: Apply 1 application topically 2 (two) times daily as needed (itching/rash).), Disp: 15 g, Rfl: 0 .  olmesartan-hydrochlorothiazide (BENICAR HCT) 40-25 MG tablet, Take 1 tablet by mouth daily., Disp: 30 tablet, Rfl: 2 .  sildenafil (REVATIO) 20 MG tablet, TAKE 1 TABLET BY MOUTH EVERY DAY (PRIOR AUTHORIZATION DENIED) (Patient taking differently: Take 20 mg by mouth daily as needed (ed).), Disp: 30 tablet, Rfl: 5 .  Tetrahydrozoline HCl (VISINE OP), Place 1 drop into both eyes daily as needed (itching/irritation)., Disp: , Rfl:  .  traZODone (DESYREL) 50 MG tablet, TAKE 1 TABLET (50 MG TOTAL) BY MOUTH AT BEDTIME AS NEEDED FOR SLEEP., Disp: 90 tablet, Rfl: 1 .  diphenhydrAMINE (BENADRYL) 25 MG tablet, Take 25 mg by mouth every 6 (six) hours as needed for allergies., Disp: , Rfl:  .  metoprolol tartrate (LOPRESSOR) 50 MG tablet, Take 1 tablet (50 mg total) by mouth 2 (two) times daily., Disp: , Rfl:  .  Semaglutide (RYBELSUS) 14 MG TABS, Take 1 tablet by mouth daily. 30 minutes before breakfast (Patient taking differently: Take 14 mg by mouth daily. 30 minutes before breakfast), Disp: 90 tablet, Rfl: 1 .  traMADol (ULTRAM) 50 MG tablet, Take 1 tablet (50 mg total) by mouth every 6 (six) hours as needed for severe pain., Disp: 10 tablet, Rfl: 0   No Known Allergies   Review of Systems  Constitutional: Negative for fatigue.  Respiratory: Negative.  Negative for wheezing.   Cardiovascular: Negative for chest pain, palpitations and leg swelling.  Endocrine:  Negative for polydipsia, polyphagia and polyuria.  Neurological: Negative for dizziness and headaches.  Psychiatric/Behavioral: The patient is not nervous/anxious.      Today's Vitals   07/17/20 1411  BP: 130/78  Pulse: 62  Temp: 98.6 F (37 C)  TempSrc: Oral  Weight: 184 lb 3.2 oz (83.6 kg)  Height: $Remove'5\' 6"'BnanOih$  (1.676 m)  PainSc: 0-No pain   Body mass index is 29.73 kg/m.   Objective:  Physical Exam Vitals reviewed.  Constitutional:      General: He is not in acute distress.    Appearance: Normal appearance.  Cardiovascular:     Rate and Rhythm: Normal rate and regular rhythm.     Pulses: Normal pulses.     Heart sounds: Normal heart sounds. No murmur heard.   Pulmonary:     Effort: Pulmonary effort is normal. No respiratory distress.     Breath sounds: Normal breath sounds. No wheezing.  Skin:  General: Skin is warm and dry.     Capillary Refill: Capillary refill takes less than 2 seconds.     Coloration: Skin is not jaundiced.  Neurological:     General: No focal deficit present.     Mental Status: He is alert and oriented to person, place, and time.     Cranial Nerves: No cranial nerve deficit.     Motor: No weakness.  Psychiatric:        Mood and Affect: Mood normal.        Behavior: Behavior normal.        Thought Content: Thought content normal.        Judgment: Judgment normal.         Assessment And Plan:     1. Type 2 diabetes mellitus with diabetic nephropathy, without long-term current use of insulin (HCC)  Chronic, fairly controlled  Continue with current medications  Encouraged to limit intake of sugary foods and drinks  Encouraged to increase physical activity to 150 minutes per week - Semaglutide (RYBELSUS) 14 MG TABS; Take 1 tablet by mouth daily. 30 minutes before breakfast (Patient taking differently: Take 14 mg by mouth daily. 30 minutes before breakfast)  Dispense: 90 tablet; Refill: 1 - CMP14+EGFR  2. Essential hypertension . B/P is  controlled.  . CMP ordered to check renal function.  . The importance of regular exercise and dietary modification was stressed to the patient.  - CMP14+EGFR  3. Stage 3a chronic kidney disease ([place])  4. Priapism  I have advised him to not take multiple doses of his sildenafil due to having an erection for an extended period of time. None currently.  I have also advised him to follow up with Urology  5. Dyslipidemia associated with type 2 diabetes mellitus (HCC)  Chronic, stable  Continue with current medications - CMP14+EGFR - Hemoglobin A1c     Patient was given opportunity to ask questions. Patient verbalized understanding of the plan and was able to repeat key elements of the plan. All questions were answered to their satisfaction.  [name], [name]   I, [name], [name], have reviewed all documentation for this visit. The documentation on 07/17/20 for the exam, diagnosis, procedures, and orders are all accurate and complete.   IF YOU HAVE BEEN REFERRED TO A SPECIALIST, IT MAY TAKE 1-2 WEEKS TO SCHEDULE/PROCESS THE REFERRAL. IF YOU HAVE NOT HEARD FROM US/SPECIALIST IN TWO WEEKS, PLEASE GIVE [place] A CALL AT [phone].   THE PATIENT IS ENCOURAGED TO PRACTICE SOCIAL DISTANCING DUE TO THE COVID-19 PANDEMIC.

## 2020-07-17 NOTE — Patient Instructions (Signed)

## 2020-07-18 LAB — CMP14+EGFR
ALT: 27 IU/L (ref 0–44)
AST: 15 IU/L (ref 0–40)
Albumin/Globulin Ratio: 1.6 (ref 1.2–2.2)
Albumin: 4.2 g/dL (ref 3.8–4.8)
Alkaline Phosphatase: 133 IU/L — ABNORMAL HIGH (ref 44–121)
BUN/Creatinine Ratio: 15 (ref 10–24)
BUN: 24 mg/dL (ref 8–27)
Bilirubin Total: 0.6 mg/dL (ref 0.0–1.2)
CO2: 23 mmol/L (ref 20–29)
Calcium: 9.3 mg/dL (ref 8.6–10.2)
Chloride: 98 mmol/L (ref 96–106)
Creatinine, Ser: 1.6 mg/dL — ABNORMAL HIGH (ref 0.76–1.27)
Globulin, Total: 2.6 g/dL (ref 1.5–4.5)
Glucose: 178 mg/dL — ABNORMAL HIGH (ref 65–99)
Potassium: 4.3 mmol/L (ref 3.5–5.2)
Sodium: 138 mmol/L (ref 134–144)
Total Protein: 6.8 g/dL (ref 6.0–8.5)
eGFR: 47 mL/min/{1.73_m2} — ABNORMAL LOW (ref >59)

## 2020-07-18 LAB — HEMOGLOBIN A1C
Est. average glucose Bld gHb Est-mCnc: 186 mg/dL
Hgb A1c MFr Bld: 8.1 % — ABNORMAL HIGH (ref 4.8–5.6)

## 2020-07-19 ENCOUNTER — Telehealth: Payer: Medicare Other

## 2020-07-21 ENCOUNTER — Telehealth: Payer: Self-pay

## 2020-07-21 NOTE — Telephone Encounter (Signed)
-----   Message from [name], [place] sent at 07/21/2020 12:32 AM EDT ----- Your kidney functions are stable. Liver functions are normal. HgbA1c is up to 8.1 from 7.2, have you been eating more junk food, breads, pastas or rice/potatoes? Have you been without any of your diabetes medications?

## 2020-07-21 NOTE — Telephone Encounter (Signed)
Left the patient a message to call back for lab results. 

## 2020-07-28 ENCOUNTER — Telehealth: Payer: Self-pay

## 2020-07-28 NOTE — Chronic Care Management (AMB) (Signed)
Chronic Care Management Pharmacy Assistant   Name: [name]  MRN: [id number] DOB: February 21, 1956  Reason for Encounter: Disease State/ Hypertension    Recent office visits:  07-17-2020 [name], [place]. STOP Tradjenta 5mg . START LOTRISONE cream apply twice daily.  Recent consult visits:  07-07-2020 Michail Jewels, PA-C(Dermatology)  Hospital visits:  None in previous 6 months  Medications: Outpatient Encounter Medications as of 07/28/2020  Medication Sig Note  . acetaminophen (TYLENOL) 325 MG tablet Take 2 tablets (650 mg total) by mouth every 6 (six) hours as needed for mild pain (or Fever >/= 101).   Marland Kitchen alfuzosin (UROXATRAL) 10 MG 24 hr tablet Take 10 mg by mouth daily with breakfast.    . aspirin EC 81 MG tablet Take 81 mg by mouth daily with breakfast.    . atorvastatin (LIPITOR) 20 MG tablet Take 1 tablet (20 mg total) by mouth daily.   . clotrimazole-betamethasone (LOTRISONE) cream Apply 1 application topically 2 (two) times daily.   . metoprolol tartrate (LOPRESSOR) 50 MG tablet TAKE 1 TABLET BY MOUTH EVERY DAY   . Multiple Vitamin (MULTIVITAMIN WITH MINERALS) TABS tablet Take 1 tablet by mouth daily with breakfast.   . MYRBETRIQ 25 MG TB24 tablet TAKE 1 TABLET BY MOUTH EVERY DAY   . nystatin (NYSTATIN) powder Apply 1 application topically 3 (three) times daily. (Patient taking differently: Apply 1 application topically 2 (two) times daily as needed (itching/rash).)   . olmesartan-hydrochlorothiazide (BENICAR HCT) 40-25 MG tablet Take 1 tablet by mouth daily.   . Semaglutide (RYBELSUS) 14 MG TABS Take 1 tablet by mouth daily. 30 minutes before breakfast   . sildenafil (REVATIO) 20 MG tablet TAKE 1 TABLET BY MOUTH EVERY DAY (PRIOR AUTHORIZATION DENIED)   . Tetrahydrozoline HCl (VISINE OP) Place 1 drop into both eyes daily as needed (itching/irritation).   . traMADol (ULTRAM) 50 MG tablet Take 1 tablet (50 mg total) by mouth every 6 (six) hours as needed for  severe pain. 02/10/2020: #20 filled 10/07/2019  . traZODone (DESYREL) 50 MG tablet TAKE 1 TABLET (50 MG TOTAL) BY MOUTH AT BEDTIME AS NEEDED FOR SLEEP.    No facility-administered encounter medications on file as of 07/28/2020.   Reviewed chart prior to disease state call. Spoke with patient regarding BP  Recent Office Vitals: BP Readings from Last 3 Encounters:  07/17/20 130/78  03/13/20 136/74  02/12/20 (!) 142/88   Pulse Readings from Last 3 Encounters:  07/17/20 62  03/13/20 82  02/12/20 73    Wt Readings from Last 3 Encounters:  07/17/20 184 lb 3.2 oz (83.6 kg)  03/13/20 181 lb 9.6 oz (82.4 kg)  02/10/20 175 lb 11.3 oz (79.7 kg)     Kidney Function Lab Results  Component Value Date/Time   CREATININE 1.60 (H) 07/17/2020 02:50 PM   CREATININE 1.70 (H) 03/13/2020 11:39 AM   GFRNONAA 41 (L) 03/13/2020 11:39 AM   GFRNONAA 55 (L) 02/12/2020 07:46 AM   GFRAA 48 (L) 03/13/2020 11:39 AM    BMP Latest Ref Rng & Units 07/17/2020 03/13/2020 02/12/2020  Glucose 65 - 99 mg/dL 178(H) 211(H) 149(H)  BUN 8 - 27 mg/dL 24 35(H) 23  Creatinine 0.76 - 1.27 mg/dL 1.60(H) 1.70(H) 1.41(H)  BUN/Creat Ratio 10 - 24 15 21  -  Sodium 134 - 144 mmol/L 138 137 138  Potassium 3.5 - 5.2 mmol/L 4.3 4.9 4.1  Chloride 96 - 106 mmol/L 98 99 107  CO2 20 - 29 mmol/L 23 21 19(L)  Calcium 8.6 - 10.2 mg/dL 9.3 10.0 9.5    . Current antihypertensive regimen:   Metoprolol Tartrate 50 mg tablet once a day   Olmesartan - Hydrochlorothiazide 40-25 mg tablet once per day   . How often are you checking your Blood Pressure? 1-2x per week   . Current home BP readings: 07-26-20 130/84  . What recent interventions/DTPs have been made by any provider to improve Blood Pressure control since last CPP Visit: Patient states he is doing well managing his medications.   . Any recent hospitalizations or ED visits since last visit with CPP? No   . What diet changes have been made to improve Blood Pressure Control?   o Patient states he doesn't eat much fast food. Patient states he still buys fresh vegetables with steak (once every 2 months) fish and chicken. Patient states this morning he had a small piece of liver pudding with egg and toast.  . What exercise is being done to improve your Blood Pressure Control?  o Patient states he walks about a mile daily and works out at [employer] walking on the treadmill and lifting weights lightly due to shoulder and back pain.  Adherence Review: Is the patient currently on ACE/ARB medication? Yes Does the patient have >5 day gap between last estimated fill dates? No  NOTES: Patient states his kidney doctor instructed him to try and keep his blood pressure under control. He plans to start checking blood pressure daily and keeping a log. Patient states he is working on eating healthier. Reminded patient of appointments coming up soon.  Star Rating Drugs: Rybelsus 14mg - Last [facility]18-2020 [facility] Olmesartan-HCTZ 40-25mg - Last filled 05-18-2020 [facility] Atorvastatin 20mg - Last filled 07-25-2020 [facility]  [place]  [phone]

## 2020-07-30 ENCOUNTER — Inpatient Hospital Stay (HOSPITAL_COMMUNITY)
Admission: EM | Admit: 2020-07-30 | Discharge: 2020-08-03 | DRG: 074 | Disposition: A | Discharge status: Skilled Nursing Facility | Payer: Medicare Other | Attending: Internal Medicine | Admitting: Internal Medicine

## 2020-07-30 ENCOUNTER — Encounter (HOSPITAL_COMMUNITY): Payer: Self-pay | Admitting: Emergency Medicine

## 2020-07-30 ENCOUNTER — Emergency Department (HOSPITAL_COMMUNITY): Payer: Medicare Other

## 2020-07-30 DIAGNOSIS — Z87442 Personal history of urinary calculi: Secondary | ICD-10-CM

## 2020-07-30 DIAGNOSIS — E1122 Type 2 diabetes mellitus with diabetic chronic kidney disease: Secondary | ICD-10-CM | POA: Diagnosis not present

## 2020-07-30 DIAGNOSIS — M109 Gout, unspecified: Secondary | ICD-10-CM | POA: Diagnosis present

## 2020-07-30 DIAGNOSIS — N179 Acute kidney failure, unspecified: Secondary | ICD-10-CM | POA: Diagnosis not present

## 2020-07-30 DIAGNOSIS — K219 Gastro-esophageal reflux disease without esophagitis: Secondary | ICD-10-CM | POA: Diagnosis present

## 2020-07-30 DIAGNOSIS — I10 Essential (primary) hypertension: Secondary | ICD-10-CM | POA: Diagnosis not present

## 2020-07-30 DIAGNOSIS — Z808 Family history of malignant neoplasm of other organs or systems: Secondary | ICD-10-CM

## 2020-07-30 DIAGNOSIS — R270 Ataxia, unspecified: Secondary | ICD-10-CM | POA: Diagnosis present

## 2020-07-30 DIAGNOSIS — R079 Chest pain, unspecified: Secondary | ICD-10-CM

## 2020-07-30 DIAGNOSIS — N1831 Chronic kidney disease, stage 3a: Secondary | ICD-10-CM | POA: Diagnosis present

## 2020-07-30 DIAGNOSIS — Z833 Family history of diabetes mellitus: Secondary | ICD-10-CM

## 2020-07-30 DIAGNOSIS — M47812 Spondylosis without myelopathy or radiculopathy, cervical region: Secondary | ICD-10-CM | POA: Diagnosis present

## 2020-07-30 DIAGNOSIS — M25559 Pain in unspecified hip: Secondary | ICD-10-CM | POA: Diagnosis not present

## 2020-07-30 DIAGNOSIS — Z800 Family history of malignant neoplasm of digestive organs: Secondary | ICD-10-CM | POA: Diagnosis not present

## 2020-07-30 DIAGNOSIS — E1165 Type 2 diabetes mellitus with hyperglycemia: Secondary | ICD-10-CM | POA: Diagnosis not present

## 2020-07-30 DIAGNOSIS — H3530 Unspecified macular degeneration: Secondary | ICD-10-CM | POA: Diagnosis present

## 2020-07-30 DIAGNOSIS — Z743 Need for continuous supervision: Secondary | ICD-10-CM | POA: Diagnosis not present

## 2020-07-30 DIAGNOSIS — Z7982 Long term (current) use of aspirin: Secondary | ICD-10-CM

## 2020-07-30 DIAGNOSIS — E1140 Type 2 diabetes mellitus with diabetic neuropathy, unspecified: Secondary | ICD-10-CM | POA: Diagnosis not present

## 2020-07-30 DIAGNOSIS — R42 Dizziness and giddiness: Secondary | ICD-10-CM

## 2020-07-30 DIAGNOSIS — E785 Hyperlipidemia, unspecified: Secondary | ICD-10-CM | POA: Diagnosis not present

## 2020-07-30 DIAGNOSIS — R0789 Other chest pain: Secondary | ICD-10-CM | POA: Diagnosis present

## 2020-07-30 DIAGNOSIS — R6889 Other general symptoms and signs: Secondary | ICD-10-CM | POA: Diagnosis not present

## 2020-07-30 DIAGNOSIS — Z8249 Family history of ischemic heart disease and other diseases of the circulatory system: Secondary | ICD-10-CM

## 2020-07-30 DIAGNOSIS — M25552 Pain in left hip: Secondary | ICD-10-CM | POA: Diagnosis present

## 2020-07-30 DIAGNOSIS — N183 Chronic kidney disease, stage 3 unspecified: Secondary | ICD-10-CM | POA: Diagnosis present

## 2020-07-30 DIAGNOSIS — Z8546 Personal history of malignant neoplasm of prostate: Secondary | ICD-10-CM

## 2020-07-30 DIAGNOSIS — R292 Abnormal reflex: Secondary | ICD-10-CM | POA: Diagnosis not present

## 2020-07-30 DIAGNOSIS — Z20822 Contact with and (suspected) exposure to covid-19: Secondary | ICD-10-CM | POA: Diagnosis not present

## 2020-07-30 DIAGNOSIS — J9811 Atelectasis: Secondary | ICD-10-CM | POA: Diagnosis not present

## 2020-07-30 DIAGNOSIS — I6522 Occlusion and stenosis of left carotid artery: Secondary | ICD-10-CM | POA: Diagnosis present

## 2020-07-30 DIAGNOSIS — R0602 Shortness of breath: Secondary | ICD-10-CM | POA: Diagnosis not present

## 2020-07-30 DIAGNOSIS — Z801 Family history of malignant neoplasm of trachea, bronchus and lung: Secondary | ICD-10-CM

## 2020-07-30 DIAGNOSIS — Z87440 Personal history of urinary (tract) infections: Secondary | ICD-10-CM

## 2020-07-30 DIAGNOSIS — R404 Transient alteration of awareness: Secondary | ICD-10-CM | POA: Diagnosis not present

## 2020-07-30 DIAGNOSIS — N1832 Chronic kidney disease, stage 3b: Secondary | ICD-10-CM | POA: Diagnosis not present

## 2020-07-30 DIAGNOSIS — E1121 Type 2 diabetes mellitus with diabetic nephropathy: Secondary | ICD-10-CM

## 2020-07-30 DIAGNOSIS — G319 Degenerative disease of nervous system, unspecified: Secondary | ICD-10-CM | POA: Diagnosis not present

## 2020-07-30 DIAGNOSIS — I6501 Occlusion and stenosis of right vertebral artery: Secondary | ICD-10-CM | POA: Diagnosis present

## 2020-07-30 DIAGNOSIS — I129 Hypertensive chronic kidney disease with stage 1 through stage 4 chronic kidney disease, or unspecified chronic kidney disease: Secondary | ICD-10-CM | POA: Diagnosis not present

## 2020-07-30 DIAGNOSIS — E860 Dehydration: Secondary | ICD-10-CM | POA: Diagnosis not present

## 2020-07-30 DIAGNOSIS — Z79899 Other long term (current) drug therapy: Secondary | ICD-10-CM

## 2020-07-30 LAB — LIPASE, BLOOD: Lipase: 39 U/L (ref 11–51)

## 2020-07-30 LAB — COMPREHENSIVE METABOLIC PANEL
ALT: 29 U/L (ref 0–44)
AST: 25 U/L (ref 15–41)
Albumin: 4 g/dL (ref 3.5–5.0)
Alkaline Phosphatase: 111 U/L (ref 38–126)
Anion gap: 11 (ref 5–15)
BUN: 27 mg/dL — ABNORMAL HIGH (ref 8–23)
CO2: 23 mmol/L (ref 22–32)
Calcium: 9.7 mg/dL (ref 8.9–10.3)
Chloride: 102 mmol/L (ref 98–111)
Creatinine, Ser: 1.77 mg/dL — ABNORMAL HIGH (ref 0.61–1.24)
GFR, Estimated: 42 mL/min — ABNORMAL LOW (ref >60)
Glucose, Bld: 259 mg/dL — ABNORMAL HIGH (ref 70–99)
Potassium: 4.3 mmol/L (ref 3.5–5.1)
Sodium: 136 mmol/L (ref 135–145)
Total Bilirubin: 0.9 mg/dL (ref 0.3–1.2)
Total Protein: 7.5 g/dL (ref 6.5–8.1)

## 2020-07-30 LAB — CBC WITH DIFFERENTIAL/PLATELET
Abs Immature Granulocytes: 0.03 10*3/uL (ref 0.00–0.07)
Basophils Absolute: 0.1 10*3/uL (ref 0.0–0.1)
Basophils Relative: 1 %
Eosinophils Absolute: 0.3 10*3/uL (ref 0.0–0.5)
Eosinophils Relative: 3 %
HCT: 55.8 % — ABNORMAL HIGH (ref 39.0–52.0)
Hemoglobin: 18.4 g/dL — ABNORMAL HIGH (ref 13.0–17.0)
Immature Granulocytes: 0 %
Lymphocytes Relative: 16 %
Lymphs Abs: 1.2 10*3/uL (ref 0.7–4.0)
MCH: 28.4 pg (ref 26.0–34.0)
MCHC: 33 g/dL (ref 30.0–36.0)
MCV: 86.2 fL (ref 80.0–100.0)
Monocytes Absolute: 0.8 10*3/uL (ref 0.1–1.0)
Monocytes Relative: 10 %
Neutro Abs: 5.6 10*3/uL (ref 1.7–7.7)
Neutrophils Relative %: 70 %
Platelets: 202 10*3/uL (ref 150–400)
RBC: 6.47 MIL/uL — ABNORMAL HIGH (ref 4.22–5.81)
RDW: 13.2 % (ref 11.5–15.5)
WBC: 7.9 10*3/uL (ref 4.0–10.5)
nRBC: 0 % (ref 0.0–0.2)

## 2020-07-30 LAB — URINALYSIS, ROUTINE W REFLEX MICROSCOPIC
Bacteria, UA: NONE SEEN
Bilirubin Urine: NEGATIVE
Glucose, UA: >=500 mg/dL — AB
Hgb urine dipstick: NEGATIVE
Ketones, ur: NEGATIVE mg/dL
Leukocytes,Ua: NEGATIVE
Nitrite: NEGATIVE
Protein, ur: 100 mg/dL — AB
Specific Gravity, Urine: 1.012 (ref 1.005–1.030)
pH: 5 (ref 5.0–8.0)

## 2020-07-30 LAB — RESP PANEL BY RT-PCR (FLU A&B, COVID) ARPGX2
Influenza A by PCR: NEGATIVE
Influenza B by PCR: NEGATIVE
SARS Coronavirus 2 by RT PCR: NEGATIVE

## 2020-07-30 LAB — ETHANOL: Alcohol, Ethyl (B): <10 mg/dL (ref <10)

## 2020-07-30 LAB — TROPONIN I (HIGH SENSITIVITY)
Troponin I (High Sensitivity): 6 ng/L (ref <18)
Troponin I (High Sensitivity): 5 ng/L (ref <18)

## 2020-07-30 MED ORDER — SODIUM CHLORIDE 0.9 % IV BOLUS
1000.0000 mL | Freq: Once | INTRAVENOUS | Status: AC
  Administered 2020-07-30: 1000 mL via INTRAVENOUS

## 2020-07-30 MED ORDER — ALUM & MAG HYDROXIDE-SIMETH 200-200-20 MG/5ML PO SUSP
30.0000 mL | Freq: Once | ORAL | Status: AC
  Administered 2020-07-30: 30 mL via ORAL
  Filled 2020-07-30: qty 30

## 2020-07-30 MED ORDER — FAMOTIDINE 20 MG PO TABS
20.0000 mg | ORAL_TABLET | Freq: Once | ORAL | Status: AC
  Administered 2020-07-30: 20 mg via ORAL
  Filled 2020-07-30: qty 1

## 2020-07-30 MED ORDER — ACETAMINOPHEN 500 MG PO TABS
1000.0000 mg | ORAL_TABLET | Freq: Once | ORAL | Status: AC
  Administered 2020-07-30: 1000 mg via ORAL
  Filled 2020-07-30: qty 2

## 2020-07-30 NOTE — ED Triage Notes (Signed)
Pt BIB [facility] [place] a restaurant. C/o feeling unwell for a few weeks now, states sometimes he feels like he is intoxicated, but he isn't. C/o left sided chest pain, states it is constant, but worsens at times. Denies shortness of breath, but states he feels tight. Given 324mg  asa.

## 2020-07-30 NOTE — ED Provider Notes (Signed)
[place] EMERGENCY DEPARTMENT Provider Note   CSN: [id number] Arrival date & time: 07/30/20  1815     History Chief Complaint  Patient presents with  . Chest Pain    [name] is a 64 y.o. male.  Patient indicates he generally hadn't felt well in past couple days, and then states since awakening this AM felt 'even worse'. States general weakness, feels dizzy at times, and then noted mild mid chest tightness all day today. Symptoms at rest, constant, persistent, moderate. Denies focal or unilateral numbness or weakness. No problems w speech or vision. States he does feel woozy at times, and as if balance is off - states at other times feels as if drinking etoh but that he hasnt had anything to drink. Denies ear pain, tinnitus, or hearing loss. No nasal or sinus congestion. No sore throat. Mild non prod cough. No known covid/flu exposure. No fever or chills. Chest pain at rest, constant, not pleuritic, non radiating. No neck or back pain. No associated sob, nv or diaphoresis. No chest wall injury or strain. Denies hx cad or fam hx premature cad. No leg pain or swelling. No recent surgery, travel or immobility, no hemoptysis - no hx dvt or pe. States has been eating and drinking including earlier today. No wt change. No headache. No recent change in meds.   The history is provided by the patient.  Chest Pain Associated symptoms: cough and dizziness   Associated symptoms: no abdominal pain, no back pain, no dysphagia, no fever, no headache, no palpitations, no shortness of breath and no vomiting        Past Medical History:  Diagnosis Date  . Closed nondisplaced spiral fracture of shaft of right tibia   . Complicated urinary tract infection 09/06/2016  . Depression   . Diabetes mellitus without complication ([place])   . GERD (gastroesophageal reflux disease)   . Gout    right foot  . Hemorrhoids   . Hypertension   . Hypertension   . Macular degeneration   .  Microcytic anemia   . Prostate cancer ([place]) 08/2009  . Rectal bleeding 07/31/2016  . Tubular adenoma     Patient Active Problem List   Diagnosis Date Noted  . AKI (acute kidney injury) ([place]) 02/11/2020  . Acute kidney injury superimposed on chronic kidney disease ([place]) 02/10/2020  . Hydroureteronephrosis 02/10/2020  . Nausea and vomiting 02/10/2020  . History of COVID-19 02/10/2020  . Pneumonia due to COVID-19 virus 09/29/2019  . Acute on chronic kidney failure ([place]) 09/28/2019  . Hyponatremia 09/28/2019  . Sinus tachycardia 09/28/2019  . Insomnia 08/26/2018  . Nephropathy 03/26/2018  . Urinary urgency 03/26/2018  . Type 2 diabetes mellitus with diabetic nephropathy, without long-term current use of insulin ([place]) 03/26/2018  . Essential hypertension 03/26/2018  . Chronic kidney disease (CKD), stage III (moderate) ([place]) 03/26/2018  . Conductive hearing loss, bilateral 11/07/2017  . Uncontrolled type 2 diabetes mellitus with hyperglycemia ([place]) 07/31/2016  . Dyslipidemia associated with type 2 diabetes mellitus ([place]) 05/29/2016  . Microcytic anemia 02/17/2012  . 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 Benign hypertension 02/17/2012  . Depressive disorder 02/17/2012    Past Surgical History:  Procedure Laterality Date  . CYSTOSCOPY WITH RETROGRADE PYELOGRAM, URETEROSCOPY AND STENT PLACEMENT Right 07/31/2016   Procedure: CYSTOSCOPY WITH RIGHT  RETROGRADE PYELOGRAM, URETEROSCOPY;  Surgeon: [name], MD;  Location: [facility];  Service: Urology;  Laterality: Right;  . INGUINAL HERNIA REPAIR    .  Left ankle joint fusion  1981  . PROSTATE BIOPSY     x 2  . URETERAL REIMPLANTION  07/31/2016   Procedure: URETERAL REIMPLANT, right boari flap, right psoas hitch;  Surgeon: [name], MD;  Location: [facility];  Service: Urology;;       Family History  Problem Relation Age of Onset  . Lung cancer Mother        Deceased  . Throat cancer Brother   . Pancreatic cancer Father    . Heart disease Father        Deceased  . Lung cancer Maternal Uncle        nephew  . Diabetes Other     Social History   Tobacco Use  . Smoking status: Never Smoker  . Smokeless tobacco: Never Used  Vaping Use  . Vaping Use: Never used  Substance Use Topics  . Alcohol use: Yes    Comment: ocassionally  . Drug use: No    Home Medications Prior to Admission medications   Medication Sig Start Date End Date Taking? Authorizing Provider  acetaminophen (TYLENOL) 325 MG tablet Take 2 tablets (650 mg total) by mouth every 6 (six) hours as needed for mild pain (or Fever >/= 101). 02/12/20   Domenic Polite, MD  alfuzosin (UROXATRAL) 10 MG 24 hr tablet Take 10 mg by mouth daily with breakfast.  09/27/19   [provider]  aspirin EC 81 MG tablet Take 81 mg by mouth daily with breakfast.     [provider]  atorvastatin (LIPITOR) 20 MG tablet Take 1 tablet (20 mg total) by mouth daily. 03/13/20   [name], [name]  clotrimazole-betamethasone (LOTRISONE) cream Apply 1 application topically 2 (two) times daily. 07/17/20   [name], [name]  metoprolol tartrate (LOPRESSOR) 50 MG tablet TAKE 1 TABLET BY MOUTH EVERY DAY 03/13/20   Glendale Chard, MD  Multiple Vitamin (MULTIVITAMIN WITH MINERALS) TABS tablet Take 1 tablet by mouth daily with breakfast.    [provider]  MYRBETRIQ 25 MG TB24 tablet TAKE 1 TABLET BY MOUTH EVERY DAY 03/29/20   [name], [name]  nystatin (NYSTATIN) powder Apply 1 application topically 3 (three) times daily. Patient taking differently: Apply 1 application topically 2 (two) times daily as needed (itching/rash). 08/26/19   [name], [name]  olmesartan-hydrochlorothiazide (BENICAR HCT) 40-25 MG tablet Take 1 tablet by mouth daily. 05/18/20   [name], [name]  Semaglutide (RYBELSUS) 14 MG TABS Take 1 tablet by mouth daily. 30 minutes before breakfast 07/17/20   [name], [name]  sildenafil (REVATIO) 20 MG tablet TAKE 1 TABLET BY MOUTH  EVERY DAY (PRIOR AUTHORIZATION DENIED) 01/18/20   [name], [name]  Tetrahydrozoline HCl (VISINE OP) Place 1 drop into both eyes daily as needed (itching/irritation).    [provider]  traMADol (ULTRAM) 50 MG tablet Take 1 tablet (50 mg total) by mouth every 6 (six) hours as needed for severe pain. 10/05/19   Thurnell Lose, MD  traZODone (DESYREL) 50 MG tablet TAKE 1 TABLET (50 MG TOTAL) BY MOUTH AT BEDTIME AS NEEDED FOR SLEEP. 06/05/20   [name], [name]    Allergies    Patient has no known allergies.  Review of Systems   Review of Systems  Constitutional: Negative for chills and fever.  HENT: Negative for sore throat and trouble swallowing.   Eyes: Negative for pain and visual disturbance.  Respiratory: Positive for cough. Negative for shortness of breath.   Cardiovascular: Positive for chest pain.  Negative for palpitations and leg swelling.  Gastrointestinal: Negative for abdominal pain, diarrhea and vomiting.  Genitourinary: Negative for dysuria and flank pain.  Musculoskeletal: Negative for back pain and neck pain.  Skin: Negative for rash.  Neurological: Positive for dizziness and light-headedness. Negative for speech difficulty and headaches.  Hematological: Does not bruise/bleed easily.  Psychiatric/Behavioral: Negative for confusion.    Physical Exam Updated Vital Signs BP (!) 163/104   Pulse (!) 121   Temp 98.3 F (36.8 C) (Oral)   Resp 20   Ht 1.676 m (5\' 6" )   Wt 78.9 kg   SpO2 100%   BMI 28.08 kg/m   Physical Exam Vitals and nursing note reviewed.  Constitutional:      Appearance: Normal appearance. He is well-developed.  HENT:     Head: Atraumatic.     Right Ear: Tympanic membrane normal.     Left Ear: Tympanic membrane normal.     Nose: Nose normal.     Mouth/Throat:     Mouth: Mucous membranes are moist.     Pharynx: Oropharynx is clear.  Eyes:     General: No scleral icterus.    Conjunctiva/sclera: Conjunctivae normal.     Pupils:  Pupils are equal, round, and reactive to light.  Neck:     Vascular: No carotid bruit.     Trachea: No tracheal deviation.     Comments: Trachea midline. Thyroid not grossly enlarged or tender.  Cardiovascular:     Rate and Rhythm: Normal rate and regular rhythm.     Pulses: Normal pulses.     Heart sounds: Normal heart sounds. No murmur heard. No friction rub. No gallop.   Pulmonary:     Effort: Pulmonary effort is normal. No accessory muscle usage or respiratory distress.     Breath sounds: Normal breath sounds.  Abdominal:     General: Bowel sounds are normal. There is no distension.     Palpations: Abdomen is soft. There is no mass.     Tenderness: There is no abdominal tenderness. There is no guarding or rebound.     Hernia: No hernia is present.  Genitourinary:    Comments: No cva tenderness. Musculoskeletal:        General: No swelling or tenderness.     Cervical back: Normal range of motion and neck supple. No rigidity.     Right lower leg: No edema.     Left lower leg: No edema.  Skin:    General: Skin is warm and dry.     Findings: No rash.  Neurological:     Mental Status: He is alert.     Comments: Alert, speech clear. Oriented. Motor intact bil, stre 5/5. Sens grossly intact.   Psychiatric:        Mood and Affect: Mood normal.     ED Results / Procedures / Treatments   Labs (all labs ordered are listed, but only abnormal results are displayed) Results for orders placed or performed during the hospital encounter of 07/30/20  Resp Panel by RT-PCR (Flu A&B, Covid) Nasopharyngeal Swab   Specimen: Nasopharyngeal Swab; Nasopharyngeal(NP) swabs in vial transport medium  Result Value Ref Range   SARS Coronavirus 2 by RT PCR NEGATIVE NEGATIVE   Influenza A by PCR NEGATIVE NEGATIVE   Influenza B by PCR NEGATIVE NEGATIVE  CBC with Differential  Result Value Ref Range   WBC 7.9 4.0 - 10.5 K/uL   RBC 6.47 (H) 4.22 - 5.81 MIL/uL   Hemoglobin 18.4 (  H) 13.0 - 17.0 g/dL    HCT 55.8 (H) 39.0 - 52.0 %   MCV 86.2 80.0 - 100.0 fL   MCH 28.4 26.0 - 34.0 pg   MCHC 33.0 30.0 - 36.0 g/dL   RDW 13.2 11.5 - 15.5 %   Platelets 202 150 - 400 K/uL   nRBC 0.0 0.0 - 0.2 %   Neutrophils Relative % 70 %   Neutro Abs 5.6 1.7 - 7.7 K/uL   Lymphocytes Relative 16 %   Lymphs Abs 1.2 0.7 - 4.0 K/uL   Monocytes Relative 10 %   Monocytes Absolute 0.8 0.1 - 1.0 K/uL   Eosinophils Relative 3 %   Eosinophils Absolute 0.3 0.0 - 0.5 K/uL   Basophils Relative 1 %   Basophils Absolute 0.1 0.0 - 0.1 K/uL   Immature Granulocytes 0 %   Abs Immature Granulocytes 0.03 0.00 - 0.07 K/uL  Comprehensive metabolic panel  Result Value Ref Range   Sodium 136 135 - 145 mmol/L   Potassium 4.3 3.5 - 5.1 mmol/L   Chloride 102 98 - 111 mmol/L   CO2 23 22 - 32 mmol/L   Glucose, Bld 259 (H) 70 - 99 mg/dL   BUN 27 (H) 8 - 23 mg/dL   Creatinine, Ser 1.77 (H) 0.61 - 1.24 mg/dL   Calcium 9.7 8.9 - 10.3 mg/dL   Total Protein 7.5 6.5 - 8.1 g/dL   Albumin 4.0 3.5 - 5.0 g/dL   AST 25 15 - 41 U/L   ALT 29 0 - 44 U/L   Alkaline Phosphatase 111 38 - 126 U/L   Total Bilirubin 0.9 0.3 - 1.2 mg/dL   GFR, Estimated 42 (L) >60 mL/min   Anion gap 11 5 - 15  Lipase, blood  Result Value Ref Range   Lipase 39 11 - 51 U/L  Ethanol  Result Value Ref Range   Alcohol, Ethyl (B) <10 <10 mg/dL  Troponin I (High Sensitivity)  Result Value Ref Range   Troponin I (High Sensitivity) 6 <18 ng/L  Troponin I (High Sensitivity)  Result Value Ref Range   Troponin I (High Sensitivity) 5 <18 ng/L    EKG EKG Interpretation  Date/Time:  Sunday Jul 30 2020 18:19:47 EDT Ventricular Rate:  101 PR Interval:  134 QRS Duration: 72 QT Interval:  334 QTC Calculation: 433 R Axis:   5 Text Interpretation: Sinus tachycardia Non-specific ST-t changes Baseline wander Confirmed by Lajean Saver [phone]) on 07/30/2020 6:47:56 PM   Radiology DG Chest 2 View  Result Date: 07/30/2020 CLINICAL DATA:  Chest pain shortness of  breath EXAM: CHEST - 2 VIEW COMPARISON:  February 10, 2020 FINDINGS: The heart size and mediastinal contours are within normal limits. Low lung volumes with bibasilar atelectasis. Stable blunting of the cardiophrenic angle at the left lung base. No visible pleural effusion or pneumothorax. The visualized skeletal structures are unremarkable. IMPRESSION: No active cardiopulmonary disease. Electronically Signed   By: Dahlia Bailiff MD   On: 07/30/2020 21:26    Procedures Procedures   Medications Ordered in ED Medications - No data to display  ED Course  I have reviewed the triage vital signs and the nursing notes.  Pertinent labs & imaging results that were available during my care of the patient were reviewed by me and considered in my medical decision making (see chart for details).    MDM Rules/Calculators/A&P  Iv ns bolus. Continuous pulse ox and cardiac monitoring. Stat labs. Imaging.   Initial ecg of relatively poor quality - repeat ecg obtained as soon as in treatment room - no acute st/t changes.   Reviewed nursing notes and prior charts for additional history.   Labs reviewed/interpreted by me - wbc normal.   CXR reviewed/interpreted by me - no pna.  On recheck, ambulating patient, pt very unsteady. States last seemed more at baseline last night prior to bed, but since awakening today, very dizzy. Denies current room spinning or nv.   MRI is pending.   2315, signed out to Dr Wyvonnia Dusky to check MRI when resulted, recheck pt, and dispo appropriately.      Final Clinical Impression(s) / ED Diagnoses Final diagnoses:  None    Rx / DC Orders ED Discharge Orders    None       Lajean Saver, MD 07/30/20 2318

## 2020-07-30 NOTE — ED Notes (Signed)
Pt given food and water

## 2020-07-30 NOTE — ED Notes (Signed)
Patient transported to MRI 

## 2020-07-30 NOTE — ED Notes (Signed)
Pt ambulated with a lot of assistance. Pt was jerking with ambulation. This tech discontinued ambulation due to safety precautions.

## 2020-07-30 NOTE — ED Notes (Signed)
Notified by security that pt had fallen out of his wheelchair. Pt found sitting beside chair, states he felt lightheaded. Pt assisted back to chair and taken to treatment room.

## 2020-07-30 NOTE — ED Provider Notes (Addendum)
Emergency Medicine Provider Triage Evaluation Note  [name] , a 65 y.o. male  was evaluated in triage.  Pt complains of chest pain beginning around 1 PM this afternoon. Left-sided chest pain, sometimes radiating towards the left lateral chest, moderate to severe, waxing and waning, vague description.  When asked about shortness of breath he states his chest feels tight, but "I would not exactly call it shortness of breath." "Maybe abdominal pain as well."  EMS administered 324 mg aspirin.   Review of Systems  Positive: Chest pain Negative: Cough, extremity edema, vomiting, syncope, neurologic deficits  Physical Exam  BP (!) 183/118 (BP Location: Right Arm)   Pulse (!) 105   Temp 98.3 F (36.8 C) (Oral)   Resp 20   Ht 5\' 6"  (1.676 m)   Wt 78.9 kg   SpO2 100%   BMI 28.08 kg/m  Gen:   Awake, appears uncomfortable. Resp:  Normal effort  MSK:   Moves extremities without difficulty  Other:    Medical Decision Making  Medically screening exam initiated at 6:24 PM.  Appropriate orders placed.  NAOMI FITTON was informed that the remainder of the evaluation will be completed by another provider, this initial triage assessment does not replace that evaluation, and the importance of remaining in the ED until their evaluation is complete.  I think this patient needs to be brought to a treatment room sooner than later, especially considering his appearance, blood pressure, and persistent chest pain that may also involve the abdomen.  This was communicated to triage nurse [facility] and the message was passed along to charge nurse Santiago Glad.   Lorayne Bender, PA-C 07/30/20 1831    Lorayne Bender, PA-C 07/30/20 1834    Tegeler, Gwenyth Allegra, MD 07/30/20 2036

## 2020-07-31 ENCOUNTER — Observation Stay (HOSPITAL_COMMUNITY): Payer: Medicare Other

## 2020-07-31 ENCOUNTER — Emergency Department (HOSPITAL_COMMUNITY): Payer: Medicare Other

## 2020-07-31 ENCOUNTER — Telehealth: Payer: Self-pay

## 2020-07-31 DIAGNOSIS — R079 Chest pain, unspecified: Secondary | ICD-10-CM | POA: Diagnosis not present

## 2020-07-31 DIAGNOSIS — R42 Dizziness and giddiness: Secondary | ICD-10-CM | POA: Diagnosis not present

## 2020-07-31 DIAGNOSIS — I6501 Occlusion and stenosis of right vertebral artery: Secondary | ICD-10-CM | POA: Diagnosis not present

## 2020-07-31 DIAGNOSIS — I6522 Occlusion and stenosis of left carotid artery: Secondary | ICD-10-CM | POA: Diagnosis not present

## 2020-07-31 DIAGNOSIS — M25552 Pain in left hip: Secondary | ICD-10-CM | POA: Diagnosis not present

## 2020-07-31 DIAGNOSIS — M25559 Pain in unspecified hip: Secondary | ICD-10-CM

## 2020-07-31 LAB — CBG MONITORING, ED
Glucose-Capillary: 157 mg/dL — ABNORMAL HIGH (ref 70–99)
Glucose-Capillary: 221 mg/dL — ABNORMAL HIGH (ref 70–99)

## 2020-07-31 LAB — RAPID URINE DRUG SCREEN, HOSP PERFORMED
Amphetamines: NOT DETECTED
Barbiturates: NOT DETECTED
Benzodiazepines: NOT DETECTED
Cocaine: NOT DETECTED
Opiates: NOT DETECTED
Tetrahydrocannabinol: NOT DETECTED

## 2020-07-31 LAB — GLUCOSE, CAPILLARY
Glucose-Capillary: 151 mg/dL — ABNORMAL HIGH (ref 70–99)
Glucose-Capillary: 152 mg/dL — ABNORMAL HIGH (ref 70–99)

## 2020-07-31 LAB — TROPONIN I (HIGH SENSITIVITY): Troponin I (High Sensitivity): 8 ng/L (ref <18)

## 2020-07-31 MED ORDER — INSULIN ASPART 100 UNIT/ML IJ SOLN
0.0000 [IU] | Freq: Three times a day (TID) | INTRAMUSCULAR | Status: DC
Start: 2020-07-31 — End: 2020-08-03
  Administered 2020-07-31: 3 [IU] via SUBCUTANEOUS
  Administered 2020-07-31 – 2020-08-01 (×3): 2 [IU] via SUBCUTANEOUS
  Administered 2020-08-01: 1 [IU] via SUBCUTANEOUS
  Administered 2020-08-02: 3 [IU] via SUBCUTANEOUS
  Administered 2020-08-02: 5 [IU] via SUBCUTANEOUS
  Administered 2020-08-02 – 2020-08-03 (×2): 2 [IU] via SUBCUTANEOUS

## 2020-07-31 MED ORDER — DIPHENHYDRAMINE HCL 25 MG PO CAPS
25.0000 mg | ORAL_CAPSULE | Freq: Four times a day (QID) | ORAL | Status: DC | PRN
  Administered 2020-07-31 – 2020-08-01 (×2): 25 mg via ORAL
  Filled 2020-07-31 (×2): qty 1

## 2020-07-31 MED ORDER — ACETAMINOPHEN 650 MG RE SUPP: 650.0000 mg | Freq: Four times a day (QID) | RECTAL | Status: DC | PRN

## 2020-07-31 MED ORDER — ACETAMINOPHEN 325 MG PO TABS
650.0000 mg | ORAL_TABLET | Freq: Four times a day (QID) | ORAL | Status: DC | PRN
  Administered 2020-07-31 – 2020-08-01 (×2): 650 mg via ORAL
  Filled 2020-07-31 (×2): qty 2

## 2020-07-31 MED ORDER — GADOBUTROL 1 MMOL/ML IV SOLN
8.0000 mL | Freq: Once | INTRAVENOUS | Status: AC | PRN
  Administered 2020-07-31: 8 mL via INTRAVENOUS

## 2020-07-31 MED ORDER — ATORVASTATIN CALCIUM 10 MG PO TABS
20.0000 mg | ORAL_TABLET | Freq: Every day | ORAL | Status: DC
  Administered 2020-07-31 – 2020-08-03 (×4): 20 mg via ORAL
  Filled 2020-07-31 (×4): qty 2

## 2020-07-31 MED ORDER — TRAZODONE HCL 50 MG PO TABS
50.0000 mg | ORAL_TABLET | Freq: Every evening | ORAL | Status: DC | PRN
  Administered 2020-08-01 – 2020-08-02 (×3): 50 mg via ORAL
  Filled 2020-07-31 (×3): qty 1

## 2020-07-31 MED ORDER — CLOTRIMAZOLE-BETAMETHASONE 1-0.05 % EX CREA
1.0000 "application " | TOPICAL_CREAM | Freq: Two times a day (BID) | CUTANEOUS | Status: DC
  Administered 2020-07-31 – 2020-08-03 (×6): 1 via TOPICAL
  Filled 2020-07-31: qty 15

## 2020-07-31 MED ORDER — LACTATED RINGERS IV SOLN: INTRAVENOUS | Status: DC

## 2020-07-31 MED ORDER — ENOXAPARIN SODIUM 40 MG/0.4ML IJ SOSY
40.0000 mg | PREFILLED_SYRINGE | INTRAMUSCULAR | Status: DC
  Administered 2020-07-31 – 2020-08-02 (×2): 40 mg via SUBCUTANEOUS
  Filled 2020-07-31 (×3): qty 0.4

## 2020-07-31 MED ORDER — MIRABEGRON ER 25 MG PO TB24
25.0000 mg | ORAL_TABLET | Freq: Every day | ORAL | Status: DC
  Administered 2020-07-31 – 2020-08-03 (×4): 25 mg via ORAL
  Filled 2020-07-31 (×4): qty 1

## 2020-07-31 MED ORDER — TRAMADOL HCL 50 MG PO TABS
50.0000 mg | ORAL_TABLET | Freq: Four times a day (QID) | ORAL | Status: DC | PRN
  Administered 2020-08-01: 50 mg via ORAL
  Filled 2020-07-31: qty 1

## 2020-07-31 MED ORDER — INSULIN ASPART 100 UNIT/ML IJ SOLN
0.0000 [IU] | Freq: Every day | INTRAMUSCULAR | Status: DC
  Administered 2020-08-02: 2 [IU] via SUBCUTANEOUS

## 2020-07-31 MED ORDER — ASPIRIN EC 81 MG PO TBEC
81.0000 mg | DELAYED_RELEASE_TABLET | Freq: Every day | ORAL | Status: DC
  Administered 2020-07-31 – 2020-08-03 (×4): 81 mg via ORAL
  Filled 2020-07-31 (×4): qty 1

## 2020-07-31 NOTE — ED Notes (Signed)
Attempted to ambulate pt. Pt unsteady and legs shaking when trying to stand. RN stopped ambulation attempt to due safety concerns.

## 2020-07-31 NOTE — Telephone Encounter (Signed)
  Care Management    Consult Note  07/31/2020 Name: [name] MRN: [id number] DOB: Jan 04, 1956  Care management team received notification of patient's current emergency department visit related to dizziness and chest pain.Based on review of health record, [name] is currently active in the embedded care coordination program.  Review of patient status, including review of consultants reports, relevant laboratory and other test results, and collaboration with appropriate care team members and the patient's provider was performed as part of comprehensive patient evaluation and provision of chronic care management services.     Plan: SW collaboration with RN Care Manager to inform of patients current disposition. The Care Management team will continue to follow to assist with care coordination needs post discharge.   [name], BSW, CDP Social Worker, Certified Dementia Practitioner [place] [facility] [phone]

## 2020-07-31 NOTE — Evaluation (Signed)
Occupational Therapy Evaluation Patient Details Name: [name] MRN: [id number] DOB: 1956/02/19 Today's Date: 07/31/2020    History of Present Illness Pt is a 65 y/o male admitted 5/15 secondary to dizziness and gait instability. MRI negative but showed microvascular changes. PMH includes HTN, prostate cancer, CKD, DM, gout, and macular degeneration.   Clinical Impression   Pt was living independently prior to admission. He does not drive an relies on friends for transportation. Pt presents with dizziness, with and without position changes, unsteadiness in standing, difficulty with visual tracking and stabilization and impaired cognition. He requires mod assist +2 for safety with ambulation and set up to max assist for ADL. Recommending SNF for further rehab prior to return home.     Follow Up Recommendations  SNF;Supervision/Assistance - 24 hour    Equipment Recommendations  3 in 1 bedside commode    Recommendations for Other Services       Precautions / Restrictions Precautions Precautions: Fall Precaution Comments: Has had 2 falls since presenting to the ED Restrictions Weight Bearing Restrictions: No      Mobility Bed Mobility Overal bed mobility: Needs Assistance Bed Mobility: Supine to Sit     Supine to sit: Min assist     General bed mobility comments: Min A for trunk elevation to come to sitting.    Transfers Overall transfer level: Needs assistance Equipment used: Rolling walker (2 wheeled) Transfers: Sit to/from Stand Sit to Stand: Min assist;+2 physical assistance         General transfer comment: min A for lift assist and steadying.    Balance Overall balance assessment: Needs assistance Sitting-balance support: No upper extremity supported;Feet supported Sitting balance-Leahy Scale: Fair Sitting balance - Comments: gets dizzy when looking down   Standing balance support: Bilateral upper extremity supported;During functional activity Standing  balance-Leahy Scale: Poor Standing balance comment: Reliant on UE and external support                           ADL either performed or assessed with clinical judgement   ADL Overall ADL's : Needs assistance/impaired Eating/Feeding: Sitting;Independent   Grooming: Set up;Sitting   Upper Body Bathing: Minimal assistance;Sitting   Lower Body Bathing: Maximal assistance;Sit to/from stand   Upper Body Dressing : Minimal assistance;Sitting   Lower Body Dressing: Maximal assistance;Sit to/from stand   Toilet Transfer: Minimal assistance;+2 for safety/equipment;RW   Toileting- Clothing Manipulation and Hygiene: Minimal assistance;Sit to/from stand       Functional mobility during ADLs: Rolling walker;+2 for safety/equipment;Minimal assistance       Vision Baseline Vision/History: Wears glasses Wears Glasses: Reading only Patient Visual Report: No change from baseline Vision Assessment?: Yes Ocular Range of Motion: Within Functional Limits Tracking/Visual Pursuits: Decreased smoothness of horizontal tracking Saccades: Impaired - to be further tested in functional context Additional Comments: 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     Perception     Praxis      Pertinent Vitals/Pain Pain Assessment: No/denies pain     Hand Dominance Right   Extremity/Trunk Assessment Upper Extremity Assessment Upper Extremity Assessment: Generalized weakness (could not comply to requirements of MMT)   Lower Extremity Assessment Lower Extremity Assessment: Defer to PT evaluation   Cervical / Trunk Assessment Cervical / Trunk Assessment: Normal   Communication Communication Communication: No difficulties   Cognition Arousal/Alertness: Awake/alert Behavior During Therapy: Flat affect Overall Cognitive Status: No family/caregiver present to determine baseline cognitive functioning Area of  Impairment: Attention;Following commands;Awareness;Problem solving                    Current Attention Level: Focused   Following Commands: Follows one step commands with increased time   Awareness: Intellectual Problem Solving: Slow processing;Requires verbal cues General Comments: requires constant cues to attend when educating in visual stabilization   General Comments       Exercises     Shoulder Instructions      Home Living Family/patient expects to be discharged to:: Private residence Living Arrangements: Alone Available Help at Discharge: Friend(s);Available PRN/intermittently Type of Home: Apartment Home Access: Stairs to enter Entrance Stairs-Number of Steps: 2 flights Entrance Stairs-Rails: Right;Left Home Layout: One level     Bathroom Shower/Tub: Occupational psychologist: Standard     Home Equipment: Clinical cytogeneticist - 2 wheels;Bedside commode;Grab bars - tub/shower;Grab bars - toilet   Additional Comments: pt reports living in same home as when he was admitted in November, but description of home he reports today does not match prior environment.      Prior Functioning/Environment Level of Independence: Needs assistance  Gait / Transfers Assistance Needed: Walks without AD ADL's / Homemaking Assistance Needed: Reports "lady friend" drives pt to appointments and helps with getting groceries, etc.            OT Problem List: Decreased strength;Decreased activity tolerance;Impaired balance (sitting and/or standing);Decreased cognition;Decreased safety awareness;Decreased knowledge of use of DME or AE      OT Treatment/Interventions: Self-care/ADL training;DME and/or AE instruction;Therapeutic activities;Cognitive remediation/compensation;Patient/family education;Balance training    OT Goals(Current goals can be found in the care plan section) Acute Rehab OT Goals Patient Stated Goal: none stated OT Goal Formulation: With patient Time For Goal Achievement: 08/14/20 Potential to Achieve Goals:  Good ADL Goals Pt Will Perform Grooming: with min guard assist;standing Pt Will Perform Lower Body Bathing: sit to/from stand;with min assist Pt Will Perform Lower Body Dressing: with min assist;sit to/from stand Pt Will Transfer to Toilet: with min guard assist;ambulating;bedside commode (over toilet) Pt Will Perform Toileting - Clothing Manipulation and hygiene: with min guard assist;sit to/from stand Additional ADL Goal #1: Pt will demonstrate sustained attention to task during ADL. Additional ADL Goal #2: Pt will use vision stabilization techniques with minimal verbal cues.  OT Frequency: Min 2X/week   Barriers to D/C: Decreased caregiver support          Co-evaluation PT/OT/SLP Co-Evaluation/Treatment: Yes Reason for Co-Treatment: For patient/therapist safety   OT goals addressed during session: ADL's and self-care;Strengthening/ROM      AM-PAC OT "6 Clicks" Daily Activity     Outcome Measure Help from another person eating meals?: None Help from another person taking care of personal grooming?: A Little Help from another person toileting, which includes using toliet, bedpan, or urinal?: A Little Help from another person bathing (including washing, rinsing, drying)?: A Lot Help from another person to put on and taking off regular upper body clothing?: A Little Help from another person to put on and taking off regular lower body clothing?: A Lot 6 Click Score: 17   End of Session Equipment Utilized During Treatment: Rolling walker;Gait belt  Activity Tolerance: Patient tolerated treatment well Patient left: in bed;with call bell/phone within reach;with nursing/sitter in room  OT Visit Diagnosis: Unsteadiness on feet (R26.81);Other abnormalities of gait and mobility (R26.89);Muscle weakness (generalized) (M62.81);Other symptoms and signs involving cognitive function  Time: 5374-8270 OT Time Calculation (min): 23 min Charges:  OT General Charges $OT Visit: 1  Visit OT Evaluation $OT Eval Moderate Complexity: 1 Mod  [name], OTR/L Acute Rehabilitation Services Pager: [phone] Office: [phone]  Malka So 07/31/2020, 3:18 PM

## 2020-07-31 NOTE — ED Notes (Addendum)
This RN found patient sitting on ground in room at 0240. Pt states "I tried to get up to use the urinal." Urinal and call bell was at bedside in pt's reach. Pt denies hitting head and only complaining of hip pain at the moment. MD notified and at bedside. VSS at this time. No neuro changes. Condom cath applied. Education provided to pt about importance of using call bell and not getting up without assistance.

## 2020-07-31 NOTE — ED Provider Notes (Signed)
MRI pending to evaluate for dizzy feeling off balance for the past 2 weeks.  No acute infarct seen.  Results discussed with Dr. Jeannine Boga of radiology.  Irregular flow void within the proximal right V4 segment, which could be related to slow flow and/or occlusion.  Discussed with Dr. Leonel Ramsay of neurology.  He has seen patient.  He is concerned patient could still be having a stroke despite negative MRI or possibly demyelinating disease.  He recommends further imaging with MRI with contrast and MRA. Patient will need admission given his unsteady gait.  He did have 1 fall in the ED while trying to use the urinal and landed on his left hip.  X-ray will be obtained.  Denies hitting his head.  D/w Dr. Marlowe Sax.   Ezequiel Essex, MD 07/31/20 [phone]

## 2020-07-31 NOTE — Progress Notes (Signed)
Patient admitted to the hospital earlier this morning by Dr. Marlowe Sax  Patient seen and examined. He feels that overall dizziness may be a little better than when he first arrived. He reports that when he was walking, it felt to him like he was "drunk". He had not had any recurrence of chest pain. Strength is equal bilaterally in upper and lower extremities  A/P:  Dizziness/ataxia -Initial MRI brain unremarkable -Neurology following -further MRI brain studies have been ordered -will check b12 -alcohol level was negative -UDS negative -seen by PT/OT with recommendations for SNF   Chest pain -atypical -cardiac enzymes negative -no EKG changes -currently chest pain free  Hip pain -xrays are negative for injury  Type 2 DM, non insulin dependent, uncontrolled with hyperglycemia -A1C 8.1 -continue on SSI  HTN -antihypertensives not continued until stroke has been ruled out  HLD -continue statin  CKD 3 -creatinine currently at baseline.  -continue to monitor  [employer]

## 2020-07-31 NOTE — Evaluation (Signed)
Physical Therapy Evaluation Patient Details Name: [name] MRN: [id number] DOB: [birth year] Today's Date: 07/31/2020   History of Present Illness  Pt is a 65 y/o male admitted 5/15 secondary to dizziness and gait instability. MRI negative but showed microvascular changes. PMH includes HTN, prostate cancer, CKD, DM, gout, and macular degeneration.  Clinical Impression  Pt admitted secondary to problem above with deficits below. Pt reports feeling "drunk" throughout mobility tasks. Multiple posterior LOB noted and requiring mod A for ambulation with chair follow. Pt with difficulty performing visual tracking tests during session. Pt currently lives alone and feel he is at increased risk for falls. Feel he would benefit from SNF level therapies at d/c. Will continue to follow acutely.     Follow Up Recommendations SNF;Supervision/Assistance - 24 hour    Equipment Recommendations  Wheelchair cushion (measurements PT);Wheelchair (measurements PT)    Recommendations for Other Services       Precautions / Restrictions Precautions Precautions: Fall Precaution Comments: Has had 2 falls since presenting to the ED Restrictions Weight Bearing Restrictions: No      Mobility  Bed Mobility Overal bed mobility: Needs Assistance Bed Mobility: Supine to Sit     Supine to sit: Min assist     General bed mobility comments: Min A for trunk elevation to come to sitting.    Transfers Overall transfer level: Needs assistance Equipment used: Rolling walker (2 wheeled) Transfers: Sit to/from Stand Sit to Stand: Min assist;+2 physical assistance         General transfer comment: min A for lift assist and steadying.  Ambulation/Gait Ambulation/Gait assistance: Mod assist;Min assist;+2 physical assistance (chair follow) Gait Distance (Feet): 25 Feet Assistive device: Rolling walker (2 wheeled) Gait Pattern/deviations: Step-through pattern;Decreased stride length Gait velocity:  Decreased   General Gait Details: Pt with multiple LOB posteriorly during ambulation, requiring mod A for steadying. Educated about looking at stationary object, but pt unable to attend to task and kept looking down. Reported feeling "drunk" throughout.  Stairs            Wheelchair Mobility    Modified Rankin (Stroke Patients Only) Modified Rankin (Stroke Patients Only) Pre-Morbid Rankin Score: No significant disability Modified Rankin: Moderately severe disability     Balance Overall balance assessment: Needs assistance Sitting-balance support: No upper extremity supported;Feet supported Sitting balance-Leahy Scale: Fair     Standing balance support: Bilateral upper extremity supported;During functional activity Standing balance-Leahy Scale: Poor Standing balance comment: Reliant on UE and external support                             Pertinent Vitals/Pain Pain Assessment: No/denies pain    Home Living Family/patient expects to be discharged to:: Private residence Living Arrangements: Alone Available Help at Discharge: Friend(s);Available PRN/intermittently Type of Home: Apartment Home Access: Stairs to enter Entrance Stairs-Rails: Right;Left Entrance Stairs-Number of Steps: 2 flights Home Layout: One level Home Equipment: Clinical cytogeneticist - 2 wheels;Bedside commode;Grab bars - tub/shower;Grab bars - toilet Additional Comments: pt reports living in same home as when he was admitted in November, but description of home he reports today does not match prior environment.    Prior Function Level of Independence: Needs assistance   Gait / Transfers Assistance Needed: Walks without AD  ADL's / Homemaking Assistance Needed: Reports "lady friend" drives pt to appointments and helps with getting groceries, etc.        Hand Dominance  Extremity/Trunk Assessment   Upper Extremity Assessment Upper Extremity Assessment: Defer to OT evaluation     Lower Extremity Assessment Lower Extremity Assessment: Generalized weakness    Cervical / Trunk Assessment Cervical / Trunk Assessment: Normal  Communication   Communication: No difficulties  Cognition Arousal/Alertness: Awake/alert Behavior During Therapy: Flat affect Overall Cognitive Status: No family/caregiver present to determine baseline cognitive functioning                                 General Comments: Difficulty attending to task and required constant cues. Slow processing and decreased safety awareness.      General Comments      Exercises     Assessment/Plan    PT Assessment Patient needs continued PT services  PT Problem List Decreased strength;Decreased balance;Decreased mobility;Decreased activity tolerance;Decreased knowledge of use of DME;Decreased knowledge of precautions       PT Treatment Interventions DME instruction;Gait training;Functional mobility training;Therapeutic activities;Therapeutic exercise;Balance training;Patient/family education;Cognitive remediation;Neuromuscular re-education    PT Goals (Current goals can be found in the Care Plan section)  Acute Rehab PT Goals Patient Stated Goal: none stated PT Goal Formulation: With patient Time For Goal Achievement: 08/14/20 Potential to Achieve Goals: Fair    Frequency Min 3X/week   Barriers to discharge        Co-evaluation               AM-PAC PT "6 Clicks" Mobility  Outcome Measure Help needed turning from your back to your side while in a flat bed without using bedrails?: A Little Help needed moving from lying on your back to sitting on the side of a flat bed without using bedrails?: A Little Help needed moving to and from a bed to a chair (including a wheelchair)?: A Lot Help needed standing up from a chair using your arms (e.g., wheelchair or bedside chair)?: A Little Help needed to walk in hospital room?: A Lot Help needed climbing 3-5 steps with a  railing? : A Lot 6 Click Score: 15    End of Session Equipment Utilized During Treatment: Gait belt Activity Tolerance: Patient tolerated treatment well Patient left: in chair;with call bell/phone within reach;with nursing/sitter in room (on stretcher in ED) Nurse Communication: Mobility status PT Visit Diagnosis: Unsteadiness on feet (R26.81);Repeated falls (R29.6);History of falling (Z91.81)    Time: 7939-0300 PT Time Calculation (min) (ACUTE ONLY): 23 min   Charges:   PT Evaluation $PT Eval Moderate Complexity: 1 Mod          Reuel Derby, PT, DPT  Acute Rehabilitation Services  Pager: [phone] Office: [phone]   Rudean Hitt 07/31/2020, 2:40 PM

## 2020-07-31 NOTE — H&P (Signed)
History and Physical    [name] WVP:[id number] DOB: November 25, 1955 DOA: 07/30/2020  PCP: [name], [name] Patient coming from: Home  Chief Complaint: Chest pain  HPI: [name] is a 65 y.o. male with medical history significant of noninsulin-dependent type 2 diabetes, hypertension, hyperlipidemia, prostate cancer, prior nephrolithiasis and chronic right hydronephrosis followed by urology, CKD stage III presented to the ED via EMS with complaints of chest pain, dizziness, and unsteady gait.  He was given aspirin 324 mg by EMS.  In the ED, patient was very unsteady when ambulating.  He fell while trying to use the urinal and landed on his left hip.  Labs showing WBC 7.9, hemoglobin 18.4 (elevated on previous labs as well), platelet count 202K.  Sodium 136, potassium 4.3, chloride 102, bicarb 23, BUN 27, creatinine 1.7 (at baseline), glucose 259.  Blood ethanol level undetectable.  UDS negative.  High-sensitivity troponin negative x2.  EKG without acute ischemic changes.  COVID and influenza PCR negative.  Chest x-ray showing no active disease.  Brain MRI negative for acute stroke.  Showing irregular flow void within the proximal right V4 segment which could be related to slow flow and/or occlusion.  Major intracranial vascular flow voids otherwise well-preserved.  Hip x-ray pending. Patient was given Tylenol, Maalox, Pepcid, and 1 L fluid bolus.  Neurology was consulted and felt that the patient's dizziness/unsteady gait could be due to possible stroke despite negative MRI.  Recommended further imaging with MRI with contrast and MRA.  Patient reports 3-week history of dizziness, lightheadedness, and unsteady gait. States he feels "drunk" but has not consumed any alcoholic beverages.  His left leg gave out a few times while walking and he recently fell.  He also fell in the ED and is now having left-sided hip pain.  Reports being diagnosed with a "mini stroke" several years ago at an outside hospital.   Patient also reports an episode of left-sided chest tightness which happened yesterday afternoon after he ate at [employer].  Denies GERD symptoms.  He thinks the chest pain might have been due to anxiety.  No associated dyspnea, diaphoresis, or nausea.  He has no other complaints.  Denies fevers, cough, shortness of breath, nausea, vomiting, abdominal pain, or diarrhea.  Review of Systems:  All systems reviewed and apart from history of presenting illness, are negative.  Past Medical History:  Diagnosis Date  . Closed nondisplaced spiral fracture of shaft of right tibia   . Complicated urinary tract infection 09/06/2016  . Depression   . Diabetes mellitus without complication ([place])   . GERD (gastroesophageal reflux disease)   . Gout    right foot  . Hemorrhoids   . Hypertension   . Hypertension   . Macular degeneration   . Microcytic anemia   . Prostate cancer ([place]) 08/2009  . Rectal bleeding 07/31/2016  . Tubular adenoma     Past Surgical History:  Procedure Laterality Date  . CYSTOSCOPY WITH RETROGRADE PYELOGRAM, URETEROSCOPY AND STENT PLACEMENT Right 07/31/2016   Procedure: CYSTOSCOPY WITH RIGHT  RETROGRADE PYELOGRAM, URETEROSCOPY;  Surgeon: [name], MD;  Location: [facility];  Service: Urology;  Laterality: Right;  . INGUINAL HERNIA REPAIR    . Left ankle joint fusion  1981  . PROSTATE BIOPSY     x 2  . URETERAL REIMPLANTION  07/31/2016   Procedure: URETERAL REIMPLANT, right boari flap, right psoas hitch;  Surgeon: [name], MD;  Location: [facility];  Service: Urology;;  reports that he has never smoked. He has never used smokeless tobacco. He reports current alcohol use. He reports that he does not use drugs.  No Known Allergies  Family History  Problem Relation Age of Onset  . Lung cancer Mother        Deceased  . Throat cancer Brother   . Pancreatic cancer Father   . Heart disease Father        Deceased  . Lung cancer Maternal Uncle        nephew   . Diabetes Other     Prior to Admission medications   Medication Sig Start Date End Date Taking? Authorizing Provider  acetaminophen (TYLENOL) 325 MG tablet Take 2 tablets (650 mg total) by mouth every 6 (six) hours as needed for mild pain (or Fever >/= 101). 02/12/20  Yes Zannie Cove, MD  aspirin EC 81 MG tablet Take 81 mg by mouth daily with breakfast.    Yes [provider]  atorvastatin (LIPITOR) 20 MG tablet Take 1 tablet (20 mg total) by mouth daily. 03/13/20  Yes [name], [name]  clotrimazole-betamethasone (LOTRISONE) cream Apply 1 application topically 2 (two) times daily. Patient taking differently: Apply 1 application topically 2 (two) times daily as needed (rash/itching). 07/17/20  Yes [name], [name]  diphenhydrAMINE (BENADRYL) 25 MG tablet Take 25 mg by mouth every 6 (six) hours as needed for allergies.   Yes [provider]  ibuprofen (ADVIL) 200 MG tablet Take 400 mg by mouth every 6 (six) hours as needed for headache or moderate pain.   Yes [provider]  metoprolol tartrate (LOPRESSOR) 50 MG tablet TAKE 1 TABLET BY MOUTH EVERY DAY Patient taking differently: Take 50 mg by mouth 2 (two) times daily. 03/13/20  Yes Dorothyann Peng, MD  MYRBETRIQ 25 MG TB24 tablet TAKE 1 TABLET BY MOUTH EVERY DAY Patient taking differently: Take 25 mg by mouth daily. 03/29/20  Yes [name], [name]  nystatin (NYSTATIN) powder Apply 1 application topically 3 (three) times daily. Patient taking differently: Apply 1 application topically 2 (two) times daily as needed (itching/rash). 08/26/19  Yes [name], [name]  olmesartan-hydrochlorothiazide (BENICAR HCT) 40-25 MG tablet Take 1 tablet by mouth daily. 05/18/20  Yes [name], [name]  Semaglutide (RYBELSUS) 14 MG TABS Take 1 tablet by mouth daily. 30 minutes before breakfast Patient taking differently: Take 14 mg by mouth daily. 30 minutes before breakfast 07/17/20  Yes [name], [name]  sildenafil (REVATIO) 20 MG  tablet TAKE 1 TABLET BY MOUTH EVERY DAY (PRIOR AUTHORIZATION DENIED) Patient taking differently: Take 20 mg by mouth daily as needed (ed). 01/18/20  Yes [name], [name]  Tetrahydrozoline HCl (VISINE OP) Place 1 drop into both eyes daily as needed (itching/irritation).   Yes [provider]  traMADol (ULTRAM) 50 MG tablet Take 1 tablet (50 mg total) by mouth every 6 (six) hours as needed for severe pain. 10/05/19  Yes Leroy Sea, MD  traZODone (DESYREL) 50 MG tablet TAKE 1 TABLET (50 MG TOTAL) BY MOUTH AT BEDTIME AS NEEDED FOR SLEEP. 06/05/20  Yes [name], [name]    Physical Exam: Vitals:   07/30/20 2345 07/31/20 0135 07/31/20 0230 07/31/20 0245  BP: 134/90  (!) 185/108 126/67  Pulse: 61 (!) 59 73 61  Resp: 15 12 (!) 23 10  Temp:    97.6 F (36.4 C)  TempSrc:    Oral  SpO2: 97% 100% 99% 97%  Weight:      Height:  Physical Exam Constitutional:      General: He is not in acute distress. HENT:     Head: Normocephalic and atraumatic.  Eyes:     Extraocular Movements: Extraocular movements intact.     Conjunctiva/sclera: Conjunctivae normal.  Cardiovascular:     Rate and Rhythm: Normal rate and regular rhythm.     Pulses: Normal pulses.  Pulmonary:     Effort: Pulmonary effort is normal. No respiratory distress.     Breath sounds: Normal breath sounds. No wheezing or rales.  Abdominal:     General: Bowel sounds are normal. There is no distension.     Palpations: Abdomen is soft.     Tenderness: There is no abdominal tenderness.  Musculoskeletal:        General: No swelling or tenderness.     Cervical back: Normal range of motion and neck supple.  Skin:    General: Skin is warm and dry.  Neurological:     General: No focal deficit present.     Mental Status: He is alert and oriented to person, place, and time.     Cranial Nerves: No cranial nerve deficit.     Sensory: No sensory deficit.     Motor: No weakness.     Labs on Admission: I have  personally reviewed following labs and imaging studies  CBC: Recent Labs  Lab 07/30/20 1829  WBC 7.9  NEUTROABS 5.6  HGB 18.4*  HCT 55.8*  MCV 86.2  PLT [id number]   Basic Metabolic Panel: Recent Labs  Lab 07/30/20 1829  NA 136  K 4.3  CL 102  CO2 23  GLUCOSE 259*  BUN 27*  CREATININE 1.77*  CALCIUM 9.7   GFR: Estimated Creatinine Clearance: 40.5 mL/min (A) (by C-G formula based on SCr of 1.77 mg/dL (H)). Liver Function Tests: Recent Labs  Lab 07/30/20 1829  AST 25  ALT 29  ALKPHOS 111  BILITOT 0.9  PROT 7.5  ALBUMIN 4.0   Recent Labs  Lab 07/30/20 1829  LIPASE 39   No results for input(s): AMMONIA in the last 168 hours. Coagulation Profile: No results for input(s): INR, PROTIME in the last 168 hours. Cardiac Enzymes: No results for input(s): CKTOTAL, CKMB, CKMBINDEX, TROPONINI in the last 168 hours. BNP (last 3 results) No results for input(s): PROBNP in the last 8760 hours. HbA1C: No results for input(s): HGBA1C in the last 72 hours. CBG: No results for input(s): GLUCAP in the last 168 hours. Lipid Profile: No results for input(s): CHOL, HDL, LDLCALC, TRIG, CHOLHDL, LDLDIRECT in the last 72 hours. Thyroid Function Tests: No results for input(s): TSH, T4TOTAL, FREET4, T3FREE, THYROIDAB in the last 72 hours. Anemia Panel: No results for input(s): VITAMINB12, FOLATE, FERRITIN, TIBC, IRON, RETICCTPCT in the last 72 hours. Urine analysis:    Component Value Date/Time   COLORURINE YELLOW 07/30/2020 2319   APPEARANCEUR CLEAR 07/30/2020 2319   LABSPEC 1.012 07/30/2020 2319   PHURINE 5.0 07/30/2020 2319   GLUCOSEU >=500 (A) 07/30/2020 2319   HGBUR NEGATIVE 07/30/2020 2319   BILIRUBINUR NEGATIVE 07/30/2020 2319   BILIRUBINUR negative 08/26/2019 1516   KETONESUR NEGATIVE 07/30/2020 2319   PROTEINUR 100 (A) 07/30/2020 2319   UROBILINOGEN 0.2 08/26/2019 1516   UROBILINOGEN 0.2 04/18/2008 1153   NITRITE NEGATIVE 07/30/2020 2319   LEUKOCYTESUR NEGATIVE  07/30/2020 2319    Radiological Exams on Admission: DG Chest 2 View  Result Date: 07/30/2020 CLINICAL DATA:  Chest pain shortness of breath EXAM: CHEST - 2 VIEW COMPARISON:  February 10, 2020 FINDINGS: The heart size and mediastinal contours are within normal limits. Low lung volumes with bibasilar atelectasis. Stable blunting of the cardiophrenic angle at the left lung base. No visible pleural effusion or pneumothorax. The visualized skeletal structures are unremarkable. IMPRESSION: No active cardiopulmonary disease. Electronically Signed   By: Dahlia Bailiff MD   On: 07/30/2020 21:26   MR ANGIO HEAD WO CONTRAST  Result Date: 07/31/2020 CLINICAL DATA:  Initial evaluation for neuro deficit, stroke suspected, dizziness. EXAM: MRI HEAD WITH CONTRAST MRA HEAD WITHOUT CONTRAST MRA NECK WITHOUT AND WITH CONTRAST TECHNIQUE: Multiplanar, multi-echo pulse sequences of the brain and surrounding structures were acquired without intravenous contrast. Angiographic images of the Circle of Willis were acquired using MRA technique without intravenous contrast. Angiographic images of the neck were acquired using MRA technique without and with intravenous contrast. Carotid stenosis measurements (when applicable) are obtained utilizing NASCET criteria, using the distal internal carotid diameter as the denominator. CONTRAST:  54mL GADAVIST GADOBUTROL 1 MMOL/ML IV SOLN COMPARISON:  Prior noncontrast MRI from earlier the same day. FINDINGS: MRI HEAD FINDINGS Postcontrast imaging of the brain demonstrates no abnormal or pathologic enhancement. No appreciable mass lesion. Normal intravascular enhancement seen throughout the major intracranial vascular structures. No new finding. MRA HEAD FINDINGS ANTERIOR CIRCULATION: Visualized distal cervical segments of the internal carotid arteries widely patent with antegrade flow. Petrous, cavernous, and supraclinoid segments patent without stenosis or other abnormality. A1 segments patent  bilaterally. Normal anterior communicating artery complex. Anterior cerebral arteries patent to their distal aspects without stenosis. No M1 stenosis or occlusion. Normal MCA bifurcations. Distal MCA branches well perfused and symmetric. POSTERIOR CIRCULATION: V4 segments patent without stenosis or other abnormality. Left PICA origin patent and normal. Right PICA not seen. Basilar widely patent to its distal aspect without stenosis. Superior cerebellar arteries patent bilaterally. Both PCAs primarily supplied via the basilar well perfused to their distal aspects. No intracranial aneurysm. MRA NECK FINDINGS AORTIC ARCH: Visualized aortic arch normal caliber with normal 3 vessel morphology. No hemodynamically significant stenosis seen about the origin of the great vessels. RIGHT CAROTID SYSTEM: Right common carotid artery patent from its origin to the bifurcation without stenosis. No significant atheromatous narrowing about the right bifurcation. Right ICA widely patent distally without stenosis, evidence for dissection or occlusion. LEFT CAROTID SYSTEM: Left CCA patent from its origin to the bifurcation without stenosis. 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 Left ICA patent distally without stenosis, evidence for dissection or occlusion. VERTEBRAL ARTERIES: Both vertebral arteries arise from the subclavian arteries. No proximal subclavian artery stenosis. Left vertebral artery slightly dominant. Vertebral arteries widely patent within the neck without stenosis, evidence for dissection or occlusion. There is a short-segment moderate to severe stenosis involving the right vertebral artery as it crosses the dural reflection into the cranial vault (series 1078, image 9). This accounts for the signal abnormality seen on prior noncontrast MRI. IMPRESSION: MRI HEAD: Negative postcontrast MRI of the head. No abnormal enhancement or other new finding. MRA HEAD:  Negative intracranial MRA with no large vessel occlusion or hemodynamically significant stenosis. MRA NECK: 1. 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 Otherwise wide patency of both carotid artery systems within the neck. 2. Short-segment moderate to severe stenosis involving the right vertebral artery as it crosses the dural reflection into the cranial vault. This accounts for the signal abnormality seen on prior noncontrast MRI. 3. Otherwise  wide patency of both vertebral arteries within the neck. The left vertebral artery is dominant. Electronically Signed   By: Jeannine Boga M.D.   On: 07/31/2020 04:32   MR ANGIO NECK W WO CONTRAST  Result Date: 07/31/2020 CLINICAL DATA:  Initial evaluation for neuro deficit, stroke suspected, dizziness. EXAM: MRI HEAD WITH CONTRAST MRA HEAD WITHOUT CONTRAST MRA NECK WITHOUT AND WITH CONTRAST TECHNIQUE: Multiplanar, multi-echo pulse sequences of the brain and surrounding structures were acquired without intravenous contrast. Angiographic images of the Circle of Willis were acquired using MRA technique without intravenous contrast. Angiographic images of the neck were acquired using MRA technique without and with intravenous contrast. Carotid stenosis measurements (when applicable) are obtained utilizing NASCET criteria, using the distal internal carotid diameter as the denominator. CONTRAST:  35mL GADAVIST GADOBUTROL 1 MMOL/ML IV SOLN COMPARISON:  Prior noncontrast MRI from earlier the same day. FINDINGS: MRI HEAD FINDINGS Postcontrast imaging of the brain demonstrates no abnormal or pathologic enhancement. No appreciable mass lesion. Normal intravascular enhancement seen throughout the major intracranial vascular structures. No new finding. MRA HEAD FINDINGS ANTERIOR CIRCULATION: Visualized distal cervical segments of the internal carotid arteries widely patent with antegrade flow. Petrous, cavernous, and supraclinoid segments  patent without stenosis or other abnormality. A1 segments patent bilaterally. Normal anterior communicating artery complex. Anterior cerebral arteries patent to their distal aspects without stenosis. No M1 stenosis or occlusion. Normal MCA bifurcations. Distal MCA branches well perfused and symmetric. POSTERIOR CIRCULATION: V4 segments patent without stenosis or other abnormality. Left PICA origin patent and normal. Right PICA not seen. Basilar widely patent to its distal aspect without stenosis. Superior cerebellar arteries patent bilaterally. Both PCAs primarily supplied via the basilar well perfused to their distal aspects. No intracranial aneurysm. MRA NECK FINDINGS AORTIC ARCH: Visualized aortic arch normal caliber with normal 3 vessel morphology. No hemodynamically significant stenosis seen about the origin of the great vessels. RIGHT CAROTID SYSTEM: Right common carotid artery patent from its origin to the bifurcation without stenosis. No significant atheromatous narrowing about the right bifurcation. Right ICA widely patent distally without stenosis, evidence for dissection or occlusion. LEFT CAROTID SYSTEM: Left CCA patent from its origin to the bifurcation without stenosis. 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 Left ICA patent distally without stenosis, evidence for dissection or occlusion. VERTEBRAL ARTERIES: Both vertebral arteries arise from the subclavian arteries. No proximal subclavian artery stenosis. Left vertebral artery slightly dominant. Vertebral arteries widely patent within the neck without stenosis, evidence for dissection or occlusion. There is a short-segment moderate to severe stenosis involving the right vertebral artery as it crosses the dural reflection into the cranial vault (series 1078, image 9). This accounts for the signal abnormality seen on prior noncontrast MRI. IMPRESSION: MRI HEAD: Negative postcontrast MRI of the  head. No abnormal enhancement or other new finding. MRA HEAD: Negative intracranial MRA with no large vessel occlusion or hemodynamically significant stenosis. MRA NECK: 1. 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 Otherwise wide patency of both carotid artery systems within the neck. 2. Short-segment moderate to severe stenosis involving the right vertebral artery as it crosses the dural reflection into the cranial vault. This accounts for the signal abnormality seen on prior noncontrast MRI. 3. Otherwise wide patency of both vertebral arteries within the neck. The left vertebral artery is dominant. Electronically Signed   By: Jeannine Boga M.D.   On: 07/31/2020 04:32   MR  BRAIN WO CONTRAST  Result Date: 07/31/2020 CLINICAL DATA:  Initial evaluation for acute dizziness. EXAM: MRI HEAD WITHOUT CONTRAST TECHNIQUE: Multiplanar, multiecho pulse sequences of the brain and surrounding structures were obtained without intravenous contrast. COMPARISON:  Prior CT from 09/28/2019 FINDINGS: Brain: Generalized age-related cerebral atrophy. Patchy and confluent T2/FLAIR hyperintensity within the periventricular deep white matter both cerebral hemispheres most consistent with chronic small vessel ischemic disease, moderate in nature. No abnormal foci of restricted diffusion to suggest acute or subacute ischemia. Gray-white matter differentiation maintained. No areas of remote cortical infarction. No evidence for acute or chronic intracranial hemorrhage. No mass lesion, midline shift or mass effect. Mild ventricular prominence related to global parenchymal volume loss of hydrocephalus. No extra-axial fluid collection. Pituitary gland suprasellar region within normal limits. Midline structures intact. Vascular: Irregular flow void within the proximal right V4 segment, which could be related to slow flow and/or occlusion (series 10, image 1). Well preserved flow void seen distally  within the right V4 segment. Mild FLAIR signal intensity within the right transverse and sigmoid sinuses without definite T1 correlate, likely related to slow/sluggish flow. No other findings to suggest dural sinus thrombosis. Skull and upper cervical spine: Craniocervical junction within normal limits. Bone marrow signal intensity within normal limits. No scalp soft tissue abnormality. Sinuses/Orbits: Globes and orbital soft tissues within normal limits. Paranasal sinuses are largely clear. No significant mastoid effusion. Inner ear structures grossly normal. Other: None. IMPRESSION: 1. No acute intracranial abnormality. 2. Age-related cerebral atrophy with moderate chronic small vessel ischemic disease. 3. Irregular flow void within the proximal right V4 segment, which could be related to slow flow and/or occlusion. Major intracranial vascular flow voids otherwise well preserved. Electronically Signed   By: Jeannine Boga M.D.   On: 07/31/2020 01:14   MR BRAIN W CONTRAST  Result Date: 07/31/2020 CLINICAL DATA:  Initial evaluation for neuro deficit, stroke suspected, dizziness. EXAM: MRI HEAD WITH CONTRAST MRA HEAD WITHOUT CONTRAST MRA NECK WITHOUT AND WITH CONTRAST TECHNIQUE: Multiplanar, multi-echo pulse sequences of the brain and surrounding structures were acquired without intravenous contrast. Angiographic images of the Circle of Willis were acquired using MRA technique without intravenous contrast. Angiographic images of the neck were acquired using MRA technique without and with intravenous contrast. Carotid stenosis measurements (when applicable) are obtained utilizing NASCET criteria, using the distal internal carotid diameter as the denominator. CONTRAST:  26mL GADAVIST GADOBUTROL 1 MMOL/ML IV SOLN COMPARISON:  Prior noncontrast MRI from earlier the same day. FINDINGS: MRI HEAD FINDINGS Postcontrast imaging of the brain demonstrates no abnormal or pathologic enhancement. No appreciable mass  lesion. Normal intravascular enhancement seen throughout the major intracranial vascular structures. No new finding. MRA HEAD FINDINGS ANTERIOR CIRCULATION: Visualized distal cervical segments of the internal carotid arteries widely patent with antegrade flow. Petrous, cavernous, and supraclinoid segments patent without stenosis or other abnormality. A1 segments patent bilaterally. Normal anterior communicating artery complex. Anterior cerebral arteries patent to their distal aspects without stenosis. No M1 stenosis or occlusion. Normal MCA bifurcations. Distal MCA branches well perfused and symmetric. POSTERIOR CIRCULATION: V4 segments patent without stenosis or other abnormality. Left PICA origin patent and normal. Right PICA not seen. Basilar widely patent to its distal aspect without stenosis. Superior cerebellar arteries patent bilaterally. Both PCAs primarily supplied via the basilar well perfused to their distal aspects. No intracranial aneurysm. MRA NECK FINDINGS AORTIC ARCH: Visualized aortic arch normal caliber with normal 3 vessel morphology. No hemodynamically significant stenosis seen about the origin of the great vessels. RIGHT CAROTID  SYSTEM: Right common carotid artery patent from its origin to the bifurcation without stenosis. No significant atheromatous narrowing about the right bifurcation. Right ICA widely patent distally without stenosis, evidence for dissection or occlusion. LEFT CAROTID SYSTEM: Left CCA patent from its origin to the bifurcation without stenosis. 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 Left ICA patent distally without stenosis, evidence for dissection or occlusion. VERTEBRAL ARTERIES: Both vertebral arteries arise from the subclavian arteries. No proximal subclavian artery stenosis. Left vertebral artery slightly dominant. Vertebral arteries widely patent within the neck without stenosis, evidence for dissection or  occlusion. There is a short-segment moderate to severe stenosis involving the right vertebral artery as it crosses the dural reflection into the cranial vault (series 1078, image 9). This accounts for the signal abnormality seen on prior noncontrast MRI. IMPRESSION: MRI HEAD: Negative postcontrast MRI of the head. No abnormal enhancement or other new finding. MRA HEAD: Negative intracranial MRA with no large vessel occlusion or hemodynamically significant stenosis. MRA NECK: 1. 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 Otherwise wide patency of both carotid artery systems within the neck. 2. Short-segment moderate to severe stenosis involving the right vertebral artery as it crosses the dural reflection into the cranial vault. This accounts for the signal abnormality seen on prior noncontrast MRI. 3. Otherwise wide patency of both vertebral arteries within the neck. The left vertebral artery is dominant. Electronically Signed   By: Jeannine Boga M.D.   On: 07/31/2020 04:32    EKG: Independently reviewed.  Sinus rhythm, no acute ischemic changes.  Assessment/Plan Principal Problem:   Dizziness Active Problems:   Benign hypertension   Chronic kidney disease (CKD), stage III (moderate) (HCC)   Chest pain   Hip pain   Dizziness, unsteady gait Ongoing for a few weeks.  Blood ethanol level undetectable.  UDS negative.  Brain MRI negative for acute stroke but showing irregular flow void within the proximal right V4 segment which could be related to slow flow and/or occlusion.  Major intracranial vascular flow voids otherwise well-preserved.  Neurology was consulted and felt that the patient's symptoms could be due to possible stroke despite negative MRI.  Recommended further imaging with MRI with contrast and MRA. -MRI with contrast and MRA pending.  PT/OT eval, fall precautions.  Chest pain Occurred after he ate at a restaurant, ?GERD/dyspepsia.  ACS less  likely given high sensitive troponin negative x2 and EKG without acute ischemic changes.  Currently chest pain-free and comfortable. -Cardiac monitoring  Hip pain Patient fell in the ED while trying to use the urinal and landed on his left hip. -Hip x-ray pending  Non-insulin-dependent type 2 diabetes A1c 8.1 on 07/17/2020. -Sensitive sliding scale insulin  Hypertension -Allow permissive hypertension at this time until stroke is ruled out  Hyperlipidemia -Continue Lipitor  CKD stage III Stable.  Creatinine at baseline.  DVT prophylaxis: Lovenox Code Status: Full code Family Communication: No family at bedside. Disposition Plan: Status is: Observation  The patient remains OBS appropriate and will d/c before 2 midnights.  Dispo: The patient is from: Home              Anticipated d/c is to: Home              Patient currently is not medically stable to d/c.   Difficult to place patient No  Level of care: Level of care: Telemetry Cardiac  The medical decision making on this patient was of high complexity and the patient is at high risk for clinical deterioration, therefore this is a level 3 visit.  Shela Leff MD [facility]  If 7PM-7AM, please contact night-coverage [email or web address]  07/31/2020, 4:42 AM

## 2020-07-31 NOTE — ED Notes (Signed)
Attempted report 

## 2020-07-31 NOTE — ED Notes (Signed)
Patient transported to MRI 

## 2020-07-31 NOTE — Plan of Care (Signed)

## 2020-07-31 NOTE — TOC Initial Note (Signed)
Transition of Care [facility]) - Initial/Assessment Note    Patient Details  Name: [name] MRN: [id number] Date of Birth: [birth year]  Transition of Care [facility]) CM/SW Contact:    Verdell Carmine, RN Phone Number: 07/31/2020, 2:01 PM  Clinical Narrative:                 Admitted 65 yo male for chest pain, dizziness unsteady gait. MRI negative acute findings. He fell on his hip in the ED. No subluxation or fracture noted. OT consulted to evaluate patient. If vertigo, perhaps Epleey maneuvers could assist in decreasing dizziness, now that neuro issues have been ruled out. OT PT will assess this. Patient will DC home, Will look for PTOT recommendations CM will follow for needs.  Expected Discharge Plan: Home/Self Care Barriers to Discharge: Continued Medical Work up   Patient Goals and CMS Choice        Expected Discharge Plan and Services Expected Discharge Plan: Home/Self Care   Discharge Planning Services: CM Consult   Living arrangements for the past 2 months: Apartment                                      Prior Living Arrangements/Services Living arrangements for the past 2 months: Apartment Lives with:: Self Patient language and need for interpreter reviewed:: Yes        Need for Family Participation in Patient Care: Yes (Comment) Care giver support system in place?: Yes (comment) Current home services: DME Criminal Activity/Legal Involvement Pertinent to Current Situation/Hospitalization: No - Comment as needed  Activities of Daily Living      Permission Sought/Granted                  Emotional Assessment       Orientation: : Oriented to Situation,Oriented to  Time,Oriented to Place,Oriented to Self Alcohol / Substance Use: Not Applicable Psych Involvement: No (comment)  Admission diagnosis:  Dizziness [R42] Chest pain, unspecified type [R07.9] Patient Active Problem List   Diagnosis Date Noted  . Dizziness 07/31/2020  . Chest pain  07/31/2020  . Hip pain 07/31/2020  . AKI (acute kidney injury) ([place]) 02/11/2020  . Acute kidney injury superimposed on chronic kidney disease ([place]) 02/10/2020  . Hydroureteronephrosis 02/10/2020  . Nausea and vomiting 02/10/2020  . History of COVID-19 02/10/2020  . Pneumonia due to COVID-19 virus 09/29/2019  . Acute on chronic kidney failure ([place]) 09/28/2019  . Hyponatremia 09/28/2019  . Sinus tachycardia 09/28/2019  . Insomnia 08/26/2018  . Nephropathy 03/26/2018  . Urinary urgency 03/26/2018  . Type 2 diabetes mellitus with diabetic nephropathy, without long-term current use of insulin ([place]) 03/26/2018  . Essential hypertension 03/26/2018  . Chronic kidney disease (CKD), stage III (moderate) ([place]) 03/26/2018  . Conductive hearing loss, bilateral 11/07/2017  . Uncontrolled type 2 diabetes mellitus with hyperglycemia ([place]) 07/31/2016  . Dyslipidemia associated with type 2 diabetes mellitus ([place]) 05/29/2016  . Microcytic anemia 02/17/2012  . 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 Benign hypertension 02/17/2012  . Depressive disorder 02/17/2012   PCP:  [name], [name] Pharmacy:   [id number] - [place], [place] [address] Phone: [phone] Fax: [phone]     Social Determinants of Health (SDOH) Interventions    Readmission Risk Interventions Readmission Risk Prevention Plan 10/05/2019  Transportation Screening Complete  PCP or Specialist  Appt within 5-7 Days Complete  Home Care Screening Complete  Medication Review (RN CM) Complete  Some recent data might be hidden

## 2020-07-31 NOTE — Progress Notes (Signed)
ALRICK CUBBAGE [id number] Admission Data: 07/31/2020 2:29 PM Attending Provider: Kathie Dike, MD  TWK:[id number], [name], [name]  [name] is a 65 y.o. male patient admitted from ED awake, alert  & orientated  X 3,  Full Code, VSS - Blood pressure (!) 179/95, pulse 68, temperature 97.7 F (36.5 C), temperature source Oral, resp. rate 16, height 5\' 6"  (1.676 m), weight 78.9 kg, SpO2 94 %., O2 No c/o shortness of breath, no c/o chest pain, no distress noted. Tele # [id number] placed and pt is currently running:normal sinus rhythm.  Pt orientation to unit, room and routine. Information packet given to patient/family.  Admission INP armband ID verified with patient/family, and in place. SR up x 2, fall risk assessment complete with patient and family verbalizing understanding of risks associated with falls. Pt verbalizes an understanding of how to use the call bell and to call for help before getting out of bed.  Skin, clean-dry- intact without evidence of bruising, or skin tears.     Will continue to monitor and assist as needed.  Hiram Comber, RN 07/31/2020 2:29 PM

## 2020-07-31 NOTE — Consult Note (Signed)
Neurology Consultation Reason for Consult: Gait difficulty Referring Physician: Rancour, S  CC: Gait difficulty  History is obtained from: Patient  HPI: YASSEEN SALLS is a 65 y.o. male with gait difficulty for about 2 weeks now.  He states that it waxes and wanes, but is always present to some degree.  He also complains of some left neck pain that started slightly before the dizziness.  He denies diplopia or vision difficulty.  He thinks that it might be slightly worse than at the beginning, but not significantly.  He describes the feeling as "like he is drunk" and I have the room is spinning around him.  LKW: 2 weeks ago tpa given?: no, outside of window   ROS: A 14 point ROS was performed and is negative except as noted in the HPI.   Past Medical History:  Diagnosis Date  . Closed nondisplaced spiral fracture of shaft of right tibia   . Complicated urinary tract infection 09/06/2016  . Depression   . Diabetes mellitus without complication ([place])   . GERD (gastroesophageal reflux disease)   . Gout    right foot  . Hemorrhoids   . Hypertension   . Hypertension   . Macular degeneration   . Microcytic anemia   . Prostate cancer ([place]) 08/2009  . Rectal bleeding 07/31/2016  . Tubular adenoma      Family History  Problem Relation Age of Onset  . Lung cancer Mother        Deceased  . Throat cancer Brother   . Pancreatic cancer Father   . Heart disease Father        Deceased  . Lung cancer Maternal Uncle        nephew  . Diabetes Other      Social History:  reports that he has never smoked. He has never used smokeless tobacco. He reports current alcohol use. He reports that he does not use drugs.   Exam: Current vital signs: BP (!) 195/91   Pulse (!) 59   Temp 98.3 F (36.8 C) (Oral)   Resp 12   Ht 5\' 6"  (1.676 m)   Wt 78.9 kg   SpO2 100%   BMI 28.08 kg/m  Vital signs in last 24 hours: Temp:  [98.3 F (36.8 C)] 98.3 F (36.8 C) (05/15 1825) Pulse Rate:   [59-121] 59 (05/16 0135) Resp:  [12-21] 12 (05/16 0135) BP: (134-195)/(63-118) 195/91 (05/16 0135) SpO2:  [96 %-100 %] 100 % (05/16 0135) Weight:  [78.9 kg] 78.9 kg (05/15 1825)   Physical Exam  Constitutional: Appears well-developed and well-nourished.  Psych: Affect appropriate to situation Eyes: No scleral injection HENT: No OP obstruction MSK: no joint deformities.  Cardiovascular: Normal rate and regular rhythm.  Respiratory: Effort normal, non-labored breathing GI: Soft.  No distension. There is no tenderness.  Skin: WDI  Neuro: Mental Status: Patient is awake, alert, oriented to person, place, month, year, and situation. Patient is able to give a clear and coherent history. No signs of aphasia or neglect Cranial Nerves: II: Visual Fields are full. Pupils are equal, round, and reactive to light.   III,IV, VI: He is able to saccade fully in all directions, no diplopia.  With smooth pursuit he is unable to pursue past midline to the left. V: Facial sensation is diminished on the right VII: Facial movement is symmetric.  VIII: hearing is intact to voice X: Uvula elevates symmetrically XI: Shoulder shrug is symmetric. XII: tongue is midline without atrophy or  fasciculations.  Motor: Tone is normal. Bulk is normal. 5/5 strength was present in bilateral arms, and the right leg he has 4/5 weakness from a previous injury which is unchanged, 5/5 on the left Sensory: Sensation is diminished in the right leg Deep Tendon Reflexes: He has 3+ reflexes in the left patella and arm, 2+ on the right arm, he guards checking the right leg Cerebellar: Finger-nose-finger is significantly slower on the left than the right.     I have reviewed labs in epic and the results pertinent to this consultation are: UA-negative  I have reviewed the images obtained: MRI brain-negative, but there is concern for decreased flow void in the right vertebral.  Impression: 65 year old male with gait  abnormality for the past couple of weeks.  I do wonder if this is actually more of an acute on chronic issue as opposed to truly acute.  He does have some issues with smooth pursuit which makes me think that this might be a central etiology, but no stroke was found in the initial MRI.  He does have significant subcortical white matter disease which I suspect is microvascular in nature, but given the lack of change on DWI, I think that doing a postcontrast study to look for enhancing lesions would be prudent.  This is especially true given his hyperreflexia on the left.  Recommendations: 1) MRI brain with contrast, will also repeat then cut diffusions. 2) MRA head and neck 3) PT, OT, ST   Roland Rack, MD [facility] [phone]  If 7pm- 7am, please page neurology on call as listed in [place].

## 2020-08-01 ENCOUNTER — Observation Stay (HOSPITAL_COMMUNITY): Payer: Medicare Other

## 2020-08-01 DIAGNOSIS — E1121 Type 2 diabetes mellitus with diabetic nephropathy: Secondary | ICD-10-CM | POA: Diagnosis not present

## 2020-08-01 DIAGNOSIS — E785 Hyperlipidemia, unspecified: Secondary | ICD-10-CM | POA: Diagnosis not present

## 2020-08-01 DIAGNOSIS — R42 Dizziness and giddiness: Secondary | ICD-10-CM | POA: Diagnosis not present

## 2020-08-01 DIAGNOSIS — R262 Difficulty in walking, not elsewhere classified: Secondary | ICD-10-CM | POA: Diagnosis not present

## 2020-08-01 DIAGNOSIS — M47812 Spondylosis without myelopathy or radiculopathy, cervical region: Secondary | ICD-10-CM | POA: Diagnosis present

## 2020-08-01 DIAGNOSIS — M25559 Pain in unspecified hip: Secondary | ICD-10-CM | POA: Diagnosis not present

## 2020-08-01 DIAGNOSIS — Z743 Need for continuous supervision: Secondary | ICD-10-CM | POA: Diagnosis not present

## 2020-08-01 DIAGNOSIS — R292 Abnormal reflex: Secondary | ICD-10-CM | POA: Diagnosis present

## 2020-08-01 DIAGNOSIS — R6889 Other general symptoms and signs: Secondary | ICD-10-CM | POA: Diagnosis not present

## 2020-08-01 DIAGNOSIS — E1140 Type 2 diabetes mellitus with diabetic neuropathy, unspecified: Secondary | ICD-10-CM | POA: Diagnosis present

## 2020-08-01 DIAGNOSIS — R0789 Other chest pain: Secondary | ICD-10-CM | POA: Diagnosis present

## 2020-08-01 DIAGNOSIS — N1831 Chronic kidney disease, stage 3a: Secondary | ICD-10-CM | POA: Diagnosis not present

## 2020-08-01 DIAGNOSIS — R079 Chest pain, unspecified: Secondary | ICD-10-CM | POA: Diagnosis not present

## 2020-08-01 DIAGNOSIS — Z8546 Personal history of malignant neoplasm of prostate: Secondary | ICD-10-CM | POA: Diagnosis not present

## 2020-08-01 DIAGNOSIS — M109 Gout, unspecified: Secondary | ICD-10-CM | POA: Diagnosis present

## 2020-08-01 DIAGNOSIS — Z833 Family history of diabetes mellitus: Secondary | ICD-10-CM | POA: Diagnosis not present

## 2020-08-01 DIAGNOSIS — Z20822 Contact with and (suspected) exposure to covid-19: Secondary | ICD-10-CM | POA: Diagnosis present

## 2020-08-01 DIAGNOSIS — Z800 Family history of malignant neoplasm of digestive organs: Secondary | ICD-10-CM | POA: Diagnosis not present

## 2020-08-01 DIAGNOSIS — R270 Ataxia, unspecified: Secondary | ICD-10-CM | POA: Diagnosis not present

## 2020-08-01 DIAGNOSIS — I6522 Occlusion and stenosis of left carotid artery: Secondary | ICD-10-CM | POA: Diagnosis present

## 2020-08-01 DIAGNOSIS — M4692 Unspecified inflammatory spondylopathy, cervical region: Secondary | ICD-10-CM | POA: Diagnosis not present

## 2020-08-01 DIAGNOSIS — R404 Transient alteration of awareness: Secondary | ICD-10-CM | POA: Diagnosis not present

## 2020-08-01 DIAGNOSIS — N183 Chronic kidney disease, stage 3 unspecified: Secondary | ICD-10-CM | POA: Diagnosis not present

## 2020-08-01 DIAGNOSIS — R2681 Unsteadiness on feet: Secondary | ICD-10-CM | POA: Diagnosis not present

## 2020-08-01 DIAGNOSIS — M50221 Other cervical disc displacement at C4-C5 level: Secondary | ICD-10-CM | POA: Diagnosis not present

## 2020-08-01 DIAGNOSIS — N1832 Chronic kidney disease, stage 3b: Secondary | ICD-10-CM

## 2020-08-01 DIAGNOSIS — I129 Hypertensive chronic kidney disease with stage 1 through stage 4 chronic kidney disease, or unspecified chronic kidney disease: Secondary | ICD-10-CM | POA: Diagnosis present

## 2020-08-01 DIAGNOSIS — R260 Ataxic gait: Secondary | ICD-10-CM | POA: Diagnosis not present

## 2020-08-01 DIAGNOSIS — E1122 Type 2 diabetes mellitus with diabetic chronic kidney disease: Secondary | ICD-10-CM | POA: Diagnosis present

## 2020-08-01 DIAGNOSIS — I10 Essential (primary) hypertension: Secondary | ICD-10-CM | POA: Diagnosis not present

## 2020-08-01 DIAGNOSIS — Z808 Family history of malignant neoplasm of other organs or systems: Secondary | ICD-10-CM | POA: Diagnosis not present

## 2020-08-01 DIAGNOSIS — I6501 Occlusion and stenosis of right vertebral artery: Secondary | ICD-10-CM | POA: Diagnosis present

## 2020-08-01 DIAGNOSIS — M25552 Pain in left hip: Secondary | ICD-10-CM | POA: Diagnosis present

## 2020-08-01 DIAGNOSIS — E860 Dehydration: Secondary | ICD-10-CM | POA: Diagnosis present

## 2020-08-01 DIAGNOSIS — E1165 Type 2 diabetes mellitus with hyperglycemia: Secondary | ICD-10-CM | POA: Diagnosis present

## 2020-08-01 DIAGNOSIS — M50222 Other cervical disc displacement at C5-C6 level: Secondary | ICD-10-CM | POA: Diagnosis not present

## 2020-08-01 DIAGNOSIS — Z801 Family history of malignant neoplasm of trachea, bronchus and lung: Secondary | ICD-10-CM | POA: Diagnosis not present

## 2020-08-01 DIAGNOSIS — K219 Gastro-esophageal reflux disease without esophagitis: Secondary | ICD-10-CM | POA: Diagnosis present

## 2020-08-01 DIAGNOSIS — H3530 Unspecified macular degeneration: Secondary | ICD-10-CM | POA: Diagnosis present

## 2020-08-01 DIAGNOSIS — N179 Acute kidney failure, unspecified: Secondary | ICD-10-CM | POA: Diagnosis present

## 2020-08-01 LAB — RENAL FUNCTION PANEL
Albumin: 3.4 g/dL — ABNORMAL LOW (ref 3.5–5.0)
Anion gap: 9 (ref 5–15)
BUN: 26 mg/dL — ABNORMAL HIGH (ref 8–23)
CO2: 25 mmol/L (ref 22–32)
Calcium: 9.8 mg/dL (ref 8.9–10.3)
Chloride: 103 mmol/L (ref 98–111)
Creatinine, Ser: 1.48 mg/dL — ABNORMAL HIGH (ref 0.61–1.24)
GFR, Estimated: 52 mL/min — ABNORMAL LOW (ref >60)
Glucose, Bld: 151 mg/dL — ABNORMAL HIGH (ref 70–99)
Phosphorus: 3.2 mg/dL (ref 2.5–4.6)
Potassium: 3.9 mmol/L (ref 3.5–5.1)
Sodium: 137 mmol/L (ref 135–145)

## 2020-08-01 LAB — CBC
HCT: 53.7 % — ABNORMAL HIGH (ref 39.0–52.0)
Hemoglobin: 17.9 g/dL — ABNORMAL HIGH (ref 13.0–17.0)
MCH: 28.7 pg (ref 26.0–34.0)
MCHC: 33.3 g/dL (ref 30.0–36.0)
MCV: 86.1 fL (ref 80.0–100.0)
Platelets: 187 10*3/uL (ref 150–400)
RBC: 6.24 MIL/uL — ABNORMAL HIGH (ref 4.22–5.81)
RDW: 13.1 % (ref 11.5–15.5)
WBC: 7.9 10*3/uL (ref 4.0–10.5)
nRBC: 0 % (ref 0.0–0.2)

## 2020-08-01 LAB — VITAMIN B12: Vitamin B-12: 405 pg/mL (ref 180–914)

## 2020-08-01 LAB — GLUCOSE, CAPILLARY
Glucose-Capillary: 147 mg/dL — ABNORMAL HIGH (ref 70–99)
Glucose-Capillary: 134 mg/dL — ABNORMAL HIGH (ref 70–99)
Glucose-Capillary: 191 mg/dL — ABNORMAL HIGH (ref 70–99)

## 2020-08-01 LAB — TSH: TSH: 0.688 u[IU]/mL (ref 0.350–4.500)

## 2020-08-01 MED ORDER — GADOBUTROL 1 MMOL/ML IV SOLN
7.5000 mL | Freq: Once | INTRAVENOUS | Status: AC | PRN
  Administered 2020-08-01: 7.5 mL via INTRAVENOUS

## 2020-08-01 MED ORDER — LORAZEPAM 2 MG/ML IJ SOLN
1.0000 mg | Freq: Four times a day (QID) | INTRAMUSCULAR | Status: DC | PRN
  Administered 2020-08-01: 1 mg via INTRAVENOUS
  Filled 2020-08-01: qty 1

## 2020-08-01 MED ORDER — LACTATED RINGERS IV SOLN: INTRAVENOUS | Status: DC

## 2020-08-01 NOTE — TOC Progression Note (Addendum)
Transition of Care [facility]) - Progression Note    Patient Details  Name: ISABEL FREESE MRN: [id number] Date of Birth: Nov 12, 1955  Transition of Care [facility]) CM/SW [place], LCSW Phone Number: 08/01/2020, 5:12 PM  Clinical Narrative:    CSW received consult for possible SNF placement at time of discharge. CSW spoke with patient. Patient expressed understanding of PT recommendation and is agreeable to SNF placement at time of discharge. CSW discussed insurance authorization process and provided Medicare SNF ratings list. [facility] selected [facility] (has been to [place] and [place] in the past and does not want to return). Patient has received one of the COVID vaccines. CSW left message for [facility] to see if they have a bed available for patient and started insurance authorization (Ref# [id number]).     Expected Discharge Plan: Skilled Nursing Facility Barriers to Discharge: Insurance Authorization  Expected Discharge Plan and Services Expected Discharge Plan: [place] In-house Referral: Clinical Social Work Discharge Planning Services: CM Consult Post Acute Care Choice: [place] Living arrangements for the past 2 months: Apartment                                       Social Determinants of Health (SDOH) Interventions    Readmission Risk Interventions Readmission Risk Prevention Plan 10/05/2019  Transportation Screening Complete  PCP or Specialist Appt within 5-7 Days Complete  Home Care Screening Complete  Medication Review (RN CM) Complete  Some recent data might be hidden

## 2020-08-01 NOTE — NC FL2 (Signed)
[place] LEVEL OF CARE SCREENING TOOL     IDENTIFICATION  Patient Name: [name] Birthdate: 1956-02-24 Sex: male Admission Date (Current Location): 07/30/2020  [facility] and [place] Number:  Herbalist and Address:  [facility] [address], [place] [address], [place], [place] [address]      Provider Number: [id number]  Attending Physician Name and Address:  Kathie Dike, MD  Relative Name and Phone Number:  Marcie Bal, sister, [phone]    Current Level of Care: Hospital Recommended Level of Care: [place] Prior Approval Number:    Date Approved/Denied:   PASRR Number: [id number]  Discharge Plan: SNF    Current Diagnoses: Patient Active Problem List   Diagnosis Date Noted  . Dizziness 07/31/2020  . Chest pain 07/31/2020  . Hip pain 07/31/2020  . AKI (acute kidney injury) ([place]) 02/11/2020  . Acute kidney injury superimposed on chronic kidney disease ([place]) 02/10/2020  . Hydroureteronephrosis 02/10/2020  . Nausea and vomiting 02/10/2020  . History of COVID-19 02/10/2020  . Pneumonia due to COVID-19 virus 09/29/2019  . Acute on chronic kidney failure ([place]) 09/28/2019  . Hyponatremia 09/28/2019  . Sinus tachycardia 09/28/2019  . Insomnia 08/26/2018  . Nephropathy 03/26/2018  . Urinary urgency 03/26/2018  . Type 2 diabetes mellitus with diabetic nephropathy, without long-term current use of insulin ([place]) 03/26/2018  . Essential hypertension 03/26/2018  . Chronic kidney disease (CKD), stage III (moderate) ([place]) 03/26/2018  . Conductive hearing loss, bilateral 11/07/2017  . Uncontrolled type 2 diabetes mellitus with hyperglycemia ([place]) 07/31/2016  . Dyslipidemia associated with type 2 diabetes mellitus ([place]) 05/29/2016  . Microcytic anemia 02/17/2012  . 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 Benign hypertension 02/17/2012  . Depressive disorder 02/17/2012    Orientation RESPIRATION BLADDER Height & Weight      Self,Time,Situation,Place  Normal Continent Weight: 174 lb (78.9 kg) Height:  5\' 6"  (167.6 cm)  BEHAVIORAL SYMPTOMS/MOOD NEUROLOGICAL BOWEL NUTRITION STATUS      Continent Diet (Please see DC Summary)  AMBULATORY STATUS COMMUNICATION OF NEEDS Skin   Limited Assist Verbally Normal                       Personal Care Assistance Level of Assistance  Bathing,Feeding,Dressing Bathing Assistance: Limited assistance Feeding assistance: Independent Dressing Assistance: Limited assistance     Functional Limitations Info  Sight Sight Info: Impaired        SPECIAL CARE FACTORS FREQUENCY  PT (By licensed PT),OT (By licensed OT)     PT Frequency: 5x/week OT Frequency: 5x/week            Contractures Contractures Info: Not present    Additional Factors Info  Code Status,Allergies,Insulin Sliding Scale Code Status Info: Full Allergies Info: NKA   Insulin Sliding Scale Info: See dc summary       Current Medications (08/01/2020):  This is the current hospital active medication list Current Facility-Administered Medications  Medication Dose Route Frequency Provider Last Rate Last Admin  . acetaminophen (TYLENOL) tablet 650 mg  650 mg Oral Q6H PRN Shela Leff, MD   650 mg at 07/31/20 1238   Or  . acetaminophen (TYLENOL) suppository 650 mg  650 mg Rectal Q6H PRN Shela Leff, MD      . aspirin EC tablet 81 mg  81 mg Oral Q breakfast Shela Leff, MD   81 mg at 08/01/20 0916  . atorvastatin (LIPITOR) tablet 20 mg  20 mg Oral Daily Shela Leff,  MD   20 mg at 08/01/20 0916  . clotrimazole-betamethasone (LOTRISONE) cream 1 application  1 application Topical BID Kathie Dike, MD   1 application at 05/39/76 0916  . diphenhydrAMINE (BENADRYL) capsule 25 mg  25 mg Oral Q6H PRN Mansy, Jan A, MD   25 mg at 07/31/20 2337  . enoxaparin (LOVENOX) injection 40 mg  40 mg Subcutaneous Q24H Shela Leff, MD   40 mg at 07/31/20 1355  . insulin aspart  (novoLOG) injection 0-5 Units  0-5 Units Subcutaneous QHS Shela Leff, MD      . insulin aspart (novoLOG) injection 0-9 Units  0-9 Units Subcutaneous TID WC Shela Leff, MD   2 Units at 08/01/20 0917  . lactated ringers infusion   Intravenous Continuous Kathie Dike, MD 75 mL/hr at 08/01/20 0559 New Bag at 08/01/20 0559  . LORazepam (ATIVAN) injection 1 mg  1 mg Intravenous Q6H PRN Kathie Dike, MD   1 mg at 08/01/20 1103  . mirabegron ER (MYRBETRIQ) tablet 25 mg  25 mg Oral Daily Shela Leff, MD   25 mg at 08/01/20 0916  . traMADol (ULTRAM) tablet 50 mg  50 mg Oral Q6H PRN Kathie Dike, MD   50 mg at 08/01/20 1024  . traZODone (DESYREL) tablet 50 mg  50 mg Oral QHS PRN Kathie Dike, MD   50 mg at 08/01/20 0003     Discharge Medications: Please see discharge summary for a list of discharge medications.  Relevant Imaging Results:  Relevant Lab Results:   Additional Information SSN [id number]. Received one Moderna vaccine on 09/18/19  Benard Halsted, LCSW

## 2020-08-01 NOTE — Care Management Obs Status (Signed)
[place] NOTIFICATION   Patient Details  Name: [name] MRN: [id number] Date of Birth: 09/26/1955   Medicare Observation Status Notification Given:  Yes    Pollie Friar, RN 08/01/2020, 2:45 PM

## 2020-08-01 NOTE — Progress Notes (Signed)
Neurology Progress Note   S:// Seen and examined.  Reports feeling somewhat better in terms of his gait. Reports having difficulty maintaining appointments to keep sugars under control.    O:// Current vital signs: BP (!) 161/96 (BP Location: Right Arm)   Pulse 72   Temp 97.6 F (36.4 C) (Oral)   Resp 18   Ht 5\' 6"  (1.676 m)   Wt 78.9 kg   SpO2 96%   BMI 28.08 kg/m  Vital signs in last 24 hours: Temp:  [97.3 F (36.3 C)-98.7 F (37.1 C)] 97.6 F (36.4 C) (05/17 0755) Pulse Rate:  [60-72] 72 (05/17 0755) Resp:  [14-22] 18 (05/17 0755) BP: (141-183)/(79-106) 161/96 (05/17 0755) SpO2:  [92 %-100 %] 96 % (05/17 0357) General: Awake alert in no distress HEENT: Normocephalic/atraumatic Lungs: Clear CVS: Regular rhythm Abdomen soft nondistended nontender Skin warm dry intact Neurological exam Awake alert oriented x3 No dysarthria Aphasia No neglect Cranial nerves: Pupils equal round reactive to light, he has broken smooth pursuit on checking his extraocular movements- somewhat equal in both directions, facial sensation intact, face symmetric, auditory Q-T intact, tongue and palate midline. Motor exam: Symmetric full strength on all fours with the exception of the right leg which has had some minimal weakness due to pain from a prior injury and is at baseline. Sensation is also somewhat diminished on the right leg but that is his baseline. DTRs: Hyperreflexic in the left patella and left biceps and brachioradialis.  2+ on the right.  Difficult to assess on the right knee due to guarding  Medications  Current Facility-Administered Medications:  .  acetaminophen (TYLENOL) tablet 650 mg, 650 mg, Oral, Q6H PRN, 650 mg at 07/31/20 1238 **OR** acetaminophen (TYLENOL) suppository 650 mg, 650 mg, Rectal, Q6H PRN, Shela Leff, MD .  aspirin EC tablet 81 mg, 81 mg, Oral, Q breakfast, Shela Leff, MD, 81 mg at 07/31/20 0857 .  atorvastatin (LIPITOR) tablet 20 mg, 20 mg,  Oral, Daily, Shela Leff, MD, 20 mg at 07/31/20 1108 .  clotrimazole-betamethasone (LOTRISONE) cream 1 application, 1 application, Topical, BID, Kathie Dike, MD, 1 application at 77/82/42 1843 .  diphenhydrAMINE (BENADRYL) capsule 25 mg, 25 mg, Oral, Q6H PRN, Mansy, Jan A, MD, 25 mg at 07/31/20 2337 .  enoxaparin (LOVENOX) injection 40 mg, 40 mg, Subcutaneous, Q24H, Shela Leff, MD, 40 mg at 07/31/20 1355 .  insulin aspart (novoLOG) injection 0-5 Units, 0-5 Units, Subcutaneous, QHS, Rathore, Vasundhra, MD .  insulin aspart (novoLOG) injection 0-9 Units, 0-9 Units, Subcutaneous, TID WC, Shela Leff, MD, 2 Units at 07/31/20 1730 .  lactated ringers infusion, , Intravenous, Continuous, Memon, Jolaine Artist, MD, Last Rate: 75 mL/hr at 08/01/20 0559, New Bag at 08/01/20 0559 .  mirabegron ER (MYRBETRIQ) tablet 25 mg, 25 mg, Oral, Daily, Shela Leff, MD, 25 mg at 07/31/20 1108 .  traMADol (ULTRAM) tablet 50 mg, 50 mg, Oral, Q6H PRN, Kathie Dike, MD .  traZODone (DESYREL) tablet 50 mg, 50 mg, Oral, QHS PRN, Kathie Dike, MD, 50 mg at 08/01/20 0003 Labs CBC    Component Value Date/Time   WBC 7.9 08/01/2020 0245   RBC 6.24 (H) 08/01/2020 0245   HGB 17.9 (H) 08/01/2020 0245   HCT 53.7 (H) 08/01/2020 0245   PLT 187 08/01/2020 0245   MCV 86.1 08/01/2020 0245   MCH 28.7 08/01/2020 0245   MCHC 33.3 08/01/2020 0245   RDW 13.1 08/01/2020 0245   LYMPHSABS 1.2 07/30/2020 1829   MONOABS 0.8 07/30/2020 1829   EOSABS  0.3 07/30/2020 1829   BASOSABS 0.1 07/30/2020 1829    CMP     Component Value Date/Time   NA 137 08/01/2020 0245   NA 138 07/17/2020 1450   K 3.9 08/01/2020 0245   CL 103 08/01/2020 0245   CO2 25 08/01/2020 0245   GLUCOSE 151 (H) 08/01/2020 0245   BUN 26 (H) 08/01/2020 0245   BUN 24 07/17/2020 1450   CREATININE 1.48 (H) 08/01/2020 0245   CALCIUM 9.8 08/01/2020 0245   PROT 7.5 07/30/2020 1829   PROT 6.8 07/17/2020 1450   ALBUMIN 3.4 (L) 08/01/2020  0245   ALBUMIN 4.2 07/17/2020 1450   AST 25 07/30/2020 1829   ALT 29 07/30/2020 1829   ALKPHOS 111 07/30/2020 1829   BILITOT 0.9 07/30/2020 1829   BILITOT 0.6 07/17/2020 1450   GFRNONAA 52 (L) 08/01/2020 0245   GFRAA 48 (L) 03/13/2020 1139   A1c 8.1 in May 2022 [id number] TSH 0.688 Imaging I have reviewed images in epic and the results pertinent to this consultation are: MR brain for some without contrast and additional study with contrast-with no evidence of acute stroke.  Moderate chronic small vessel disease with moderate cerebral atrophy somewhat advanced for age.  No abnormal enhancement MRA of the head negative intracranial MRA with no large occlusion or hemodynamically significant stenosis. 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 Short segment moderate to severe stenosis of the right vertebral artery as it crosses the dural reflection into the cranial vault-corroborates with signal abnormality seen on prior noncontrast MRI.  Wide patency of both vertebral arteries within the neck.  Dominant left vertebral artery.   Assessment:  65 year old with past medical history of uncontrolled diabetes presenting with difficulty walking for the past 1 to 2 weeks. Appears to have issues with tingling and neuropathy in his legs. On examination did have trouble with smooth pursuit as well as was hyperreflexic on the left raising suspicion for something more central. MRI of the brain with and without contrast is unremarkable for stroke or abnormal enhancement. 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 Still does not completely account for the left sided hyperreflexia. The only additional thought/differential I  have on his case is cervical myelopathy given left-sided hyperreflexia.  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  Continue B12 replenishment-he has a current level of 405 and I would recommend keeping levels always above 400.  PT OT speech therapy  Continue aspirin and statin  Strict glucose control with goal A1c less than 7  Outpatient neurology follow-up in 6 to 8 weeks  -- Amie Portland, MD Neurologist [facility] Pager: [phone]

## 2020-08-01 NOTE — Progress Notes (Addendum)
PROGRESS NOTE    [name]  [id number] DOB: 07-14-1955 DOA: 07/30/2020 PCP: [name], [name]    Brief Narrative:  65 y/o male with history of DM, HTN, HLD, CKD 3, presented with dizziness and ataxia for 1-2 weeks. He was noted to he hyperreflexic on the left. MRI brain was negative. Further work up underway by neurology. Seen by PT/OT with recommendations for SNF   Assessment & Plan:   Principal Problem:   Dizziness Active Problems:   Benign hypertension   Chronic kidney disease (CKD), stage III (moderate) (HCC)   Chest pain   Hip pain   Dizziness/ataxia -MRI imaging of brain was unremarkable -Neurology following -MRI C spine has been ordered -b12 405 -alcohol level was negative -UDS negative -seen by PT/OT with recommendations for SNF. Patient is agreeable, will check with TOC -will need outpatient follow up with neurology in 6-8 weeks   Chest pain -atypical -cardiac enzymes negative -no EKG changes -currently chest pain free  Hip pain -xrays are negative for injury  Type 2 DM, non insulin dependent, uncontrolled with hyperglycemia -A1C 8.1 -continue on SSI  HTN -antihypertensives not continued until stroke has been ruled out  HLD -continue statin  CKD 3 -patient was noted to be mildly dehydrated on admission -started on IV fluids -creatinine improved from 1.7 to 1.4 with fluids -will check orthostatics to see if this is contributing to his ataxia -continue IV fluids for now   DVT prophylaxis: enoxaparin (LOVENOX) injection 40 mg Start: 07/31/20 1400  Code Status: full code Family Communication: discussed with patient Disposition Plan: Status is: Observation  The patient remains OBS appropriate and will d/c before 2 midnights.  Dispo: The patient is from: Home              Anticipated d/c is to: SNF              Patient currently is not medically stable to d/c.   Difficult to place patient No         Consultants:    neurology  Procedures:     Antimicrobials:       Subjective: Feels that dizziness is mildly better  Objective: Vitals:   07/31/20 2019 08/01/20 0007 08/01/20 0357 08/01/20 0755  BP: (!) 156/84 (!) 183/95 (!) 150/79 (!) 161/96  Pulse: 62 70 60 72  Resp: 19 14 15 18   Temp: 98.7 F (37.1 C) 98.3 F (36.8 C) 97.7 F (36.5 C) 97.6 F (36.4 C)  TempSrc: Oral Oral Oral Oral  SpO2: 95% 92% 96%   Weight:      Height:        Intake/Output Summary (Last 24 hours) at 08/01/2020 0925 Last data filed at 08/01/2020 0758 Gross per 24 hour  Intake 14.62 ml  Output 1850 ml  Net -1835.38 ml   Filed Weights   07/30/20 1825  Weight: 78.9 kg    Examination:  General exam: Appears calm and comfortable  Respiratory system: Clear to auscultation. Respiratory effort normal. Cardiovascular system: S1 & S2 heard, RRR. No JVD, murmurs, rubs, gallops or clicks. No pedal edema. Gastrointestinal system: Abdomen is nondistended, soft and nontender. No organomegaly or masses felt. Normal bowel sounds heard. Central nervous system: Alert and oriented. No focal neurological deficits. Extremities: Symmetric 5 x 5 power. Skin: No rashes, lesions or ulcers Psychiatry: Judgement and insight appear normal. Mood & affect appropriate.     Data Reviewed: I have personally reviewed following labs and imaging studies  CBC: Recent Labs  Lab 07/30/20 1829 08/01/20 0245  WBC 7.9 7.9  NEUTROABS 5.6  --   HGB 18.4* 17.9*  HCT 55.8* 53.7*  MCV 86.2 86.1  PLT 202 [id number]   Basic Metabolic Panel: Recent Labs  Lab 07/30/20 1829 08/01/20 0245  NA 136 137  K 4.3 3.9  CL 102 103  CO2 23 25  GLUCOSE 259* 151*  BUN 27* 26*  CREATININE 1.77* 1.48*  CALCIUM 9.7 9.8  PHOS  --  3.2   GFR: Estimated Creatinine Clearance: 48.5 mL/min (A) (by C-G formula based on SCr of 1.48 mg/dL (H)). Liver Function Tests: Recent Labs  Lab 07/30/20 1829 08/01/20 0245  AST 25  --   ALT 29  --   ALKPHOS 111   --   BILITOT 0.9  --   PROT 7.5  --   ALBUMIN 4.0 3.4*   Recent Labs  Lab 07/30/20 1829  LIPASE 39   No results for input(s): AMMONIA in the last 168 hours. Coagulation Profile: No results for input(s): INR, PROTIME in the last 168 hours. Cardiac Enzymes: No results for input(s): CKTOTAL, CKMB, CKMBINDEX, TROPONINI in the last 168 hours. BNP (last 3 results) No results for input(s): PROBNP in the last 8760 hours. HbA1C: No results for input(s): HGBA1C in the last 72 hours. CBG: Recent Labs  Lab 07/31/20 0831 07/31/20 1231 07/31/20 1727 07/31/20 2302 08/01/20 0841  GLUCAP 157* 221* 151* 152* 147*   Lipid Profile: No results for input(s): CHOL, HDL, LDLCALC, TRIG, CHOLHDL, LDLDIRECT in the last 72 hours. Thyroid Function Tests: Recent Labs    08/01/20 0749  TSH 0.688   Anemia Panel: Recent Labs    08/01/20 0749  VITAMINB12 405   Sepsis Labs: No results for input(s): PROCALCITON, LATICACIDVEN in the last 168 hours.  Recent Results (from the past 240 hour(s))  Resp Panel by RT-PCR (Flu A&B, Covid) Nasopharyngeal Swab     Status: None   Collection Time: 07/30/20  7:26 PM   Specimen: Nasopharyngeal Swab; Nasopharyngeal(NP) swabs in vial transport medium  Result Value Ref Range Status   SARS Coronavirus 2 by RT PCR NEGATIVE NEGATIVE Final    Comment: (NOTE) SARS-CoV-2 target nucleic acids are NOT DETECTED.  The SARS-CoV-2 RNA is generally detectable in upper respiratory specimens during the acute phase of infection. The lowest concentration of SARS-CoV-2 viral copies this assay can detect is 138 copies/mL. A negative result does not preclude SARS-Cov-2 infection and should not be used as the sole basis for treatment or other patient management decisions. A negative result may occur with  improper specimen collection/handling, submission of specimen other than nasopharyngeal swab, presence of viral mutation(s) within the areas targeted by this assay, and  inadequate number of viral copies(<138 copies/mL). A negative result must be combined with clinical observations, patient history, and epidemiological information. The expected result is Negative.  Fact Sheet for Patients:  EntrepreneurPulse.com.au  Fact Sheet for Healthcare Providers:  IncredibleEmployment.be  This test is no t yet approved or cleared by the [place] FDA and  has been authorized for detection and/or diagnosis of SARS-CoV-2 by FDA under an Emergency Use Authorization (EUA). This EUA will remain  in effect (meaning this test can be used) for the duration of the COVID-19 declaration under Section 564(b)(1) of the Act, 21 U.S.C.section 360bbb-3(b)(1), unless the authorization is terminated  or revoked sooner.       Influenza A by PCR NEGATIVE NEGATIVE Final   Influenza B by PCR NEGATIVE NEGATIVE Final  Comment: (NOTE) The Xpert Xpress SARS-CoV-2/FLU/RSV plus assay is intended as an aid in the diagnosis of influenza from Nasopharyngeal swab specimens and should not be used as a sole basis for treatment. Nasal washings and aspirates are unacceptable for Xpert Xpress SARS-CoV-2/FLU/RSV testing.  Fact Sheet for Patients: EntrepreneurPulse.com.au  Fact Sheet for Healthcare Providers: IncredibleEmployment.be  This test is not yet approved or cleared by the [place] FDA and has been authorized for detection and/or diagnosis of SARS-CoV-2 by FDA under an Emergency Use Authorization (EUA). This EUA will remain in effect (meaning this test can be used) for the duration of the COVID-19 declaration under Section 564(b)(1) of the Act, 21 U.S.C. section 360bbb-3(b)(1), unless the authorization is terminated or revoked.  Performed at [facility], [facility] [address] [place], [place] [address]          Radiology Studies: DG Chest 2 View  Result Date: 07/30/2020 CLINICAL DATA:   Chest pain shortness of breath EXAM: CHEST - 2 VIEW COMPARISON:  February 10, 2020 FINDINGS: The heart size and mediastinal contours are within normal limits. Low lung volumes with bibasilar atelectasis. Stable blunting of the cardiophrenic angle at the left lung base. No visible pleural effusion or pneumothorax. The visualized skeletal structures are unremarkable. IMPRESSION: No active cardiopulmonary disease. Electronically Signed   By: Dahlia Bailiff MD   On: 07/30/2020 21:26   MR ANGIO HEAD WO CONTRAST  Result Date: 07/31/2020 CLINICAL DATA:  Initial evaluation for neuro deficit, stroke suspected, dizziness. EXAM: MRI HEAD WITH CONTRAST MRA HEAD WITHOUT CONTRAST MRA NECK WITHOUT AND WITH CONTRAST TECHNIQUE: Multiplanar, multi-echo pulse sequences of the brain and surrounding structures were acquired without intravenous contrast. Angiographic images of the Circle of Willis were acquired using MRA technique without intravenous contrast. Angiographic images of the neck were acquired using MRA technique without and with intravenous contrast. Carotid stenosis measurements (when applicable) are obtained utilizing NASCET criteria, using the distal internal carotid diameter as the denominator. CONTRAST:  12mL GADAVIST GADOBUTROL 1 MMOL/ML IV SOLN COMPARISON:  Prior noncontrast MRI from earlier the same day. FINDINGS: MRI HEAD FINDINGS Postcontrast imaging of the brain demonstrates no abnormal or pathologic enhancement. No appreciable mass lesion. Normal intravascular enhancement seen throughout the major intracranial vascular structures. No new finding. MRA HEAD FINDINGS ANTERIOR CIRCULATION: Visualized distal cervical segments of the internal carotid arteries widely patent with antegrade flow. Petrous, cavernous, and supraclinoid segments patent without stenosis or other abnormality. A1 segments patent bilaterally. Normal anterior communicating artery complex. Anterior cerebral arteries patent to their distal  aspects without stenosis. No M1 stenosis or occlusion. Normal MCA bifurcations. Distal MCA branches well perfused and symmetric. POSTERIOR CIRCULATION: V4 segments patent without stenosis or other abnormality. Left PICA origin patent and normal. Right PICA not seen. Basilar widely patent to its distal aspect without stenosis. Superior cerebellar arteries patent bilaterally. Both PCAs primarily supplied via the basilar well perfused to their distal aspects. No intracranial aneurysm. MRA NECK FINDINGS AORTIC ARCH: Visualized aortic arch normal caliber with normal 3 vessel morphology. No hemodynamically significant stenosis seen about the origin of the great vessels. RIGHT CAROTID SYSTEM: Right common carotid artery patent from its origin to the bifurcation without stenosis. No significant atheromatous narrowing about the right bifurcation. Right ICA widely patent distally without stenosis, evidence for dissection or occlusion. LEFT CAROTID SYSTEM: Left CCA patent from its origin to the bifurcation without stenosis. 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 Left ICA  patent distally without stenosis, evidence for dissection or occlusion. VERTEBRAL ARTERIES: Both vertebral arteries arise from the subclavian arteries. No proximal subclavian artery stenosis. Left vertebral artery slightly dominant. Vertebral arteries widely patent within the neck without stenosis, evidence for dissection or occlusion. There is a short-segment moderate to severe stenosis involving the right vertebral artery as it crosses the dural reflection into the cranial vault (series 1078, image 9). This accounts for the signal abnormality seen on prior noncontrast MRI. IMPRESSION: MRI HEAD: Negative postcontrast MRI of the head. No abnormal enhancement or other new finding. MRA HEAD: Negative intracranial MRA with no large vessel occlusion or hemodynamically significant stenosis. MRA NECK: 1.  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 Otherwise wide patency of both carotid artery systems within the neck. 2. Short-segment moderate to severe stenosis involving the right vertebral artery as it crosses the dural reflection into the cranial vault. This accounts for the signal abnormality seen on prior noncontrast MRI. 3. Otherwise wide patency of both vertebral arteries within the neck. The left vertebral artery is dominant. Electronically Signed   By: Jeannine Boga M.D.   On: 07/31/2020 04:32   MR ANGIO NECK W WO CONTRAST  Result Date: 07/31/2020 CLINICAL DATA:  Initial evaluation for neuro deficit, stroke suspected, dizziness. EXAM: MRI HEAD WITH CONTRAST MRA HEAD WITHOUT CONTRAST MRA NECK WITHOUT AND WITH CONTRAST TECHNIQUE: Multiplanar, multi-echo pulse sequences of the brain and surrounding structures were acquired without intravenous contrast. Angiographic images of the Circle of Willis were acquired using MRA technique without intravenous contrast. Angiographic images of the neck were acquired using MRA technique without and with intravenous contrast. Carotid stenosis measurements (when applicable) are obtained utilizing NASCET criteria, using the distal internal carotid diameter as the denominator. CONTRAST:  27mL GADAVIST GADOBUTROL 1 MMOL/ML IV SOLN COMPARISON:  Prior noncontrast MRI from earlier the same day. FINDINGS: MRI HEAD FINDINGS Postcontrast imaging of the brain demonstrates no abnormal or pathologic enhancement. No appreciable mass lesion. Normal intravascular enhancement seen throughout the major intracranial vascular structures. No new finding. MRA HEAD FINDINGS ANTERIOR CIRCULATION: Visualized distal cervical segments of the internal carotid arteries widely patent with antegrade flow. Petrous, cavernous, and supraclinoid segments patent without stenosis or other abnormality. A1 segments patent bilaterally. Normal anterior communicating  artery complex. Anterior cerebral arteries patent to their distal aspects without stenosis. No M1 stenosis or occlusion. Normal MCA bifurcations. Distal MCA branches well perfused and symmetric. POSTERIOR CIRCULATION: V4 segments patent without stenosis or other abnormality. Left PICA origin patent and normal. Right PICA not seen. Basilar widely patent to its distal aspect without stenosis. Superior cerebellar arteries patent bilaterally. Both PCAs primarily supplied via the basilar well perfused to their distal aspects. No intracranial aneurysm. MRA NECK FINDINGS AORTIC ARCH: Visualized aortic arch normal caliber with normal 3 vessel morphology. No hemodynamically significant stenosis seen about the origin of the great vessels. RIGHT CAROTID SYSTEM: Right common carotid artery patent from its origin to the bifurcation without stenosis. No significant atheromatous narrowing about the right bifurcation. Right ICA widely patent distally without stenosis, evidence for dissection or occlusion. LEFT CAROTID SYSTEM: Left CCA patent from its origin to the bifurcation without stenosis. 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 Left ICA patent distally without stenosis, evidence for dissection or occlusion. VERTEBRAL ARTERIES: Both vertebral arteries arise from the subclavian arteries. No proximal subclavian artery stenosis. Left vertebral artery slightly dominant. Vertebral arteries widely  patent within the neck without stenosis, evidence for dissection or occlusion. There is a short-segment moderate to severe stenosis involving the right vertebral artery as it crosses the dural reflection into the cranial vault (series 1078, image 9). This accounts for the signal abnormality seen on prior noncontrast MRI. IMPRESSION: MRI HEAD: Negative postcontrast MRI of the head. No abnormal enhancement or other new finding. MRA HEAD: Negative intracranial MRA with no large vessel  occlusion or hemodynamically significant stenosis. MRA NECK: 1. 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 Otherwise wide patency of both carotid artery systems within the neck. 2. Short-segment moderate to severe stenosis involving the right vertebral artery as it crosses the dural reflection into the cranial vault. This accounts for the signal abnormality seen on prior noncontrast MRI. 3. Otherwise wide patency of both vertebral arteries within the neck. The left vertebral artery is dominant. Electronically Signed   By: Jeannine Boga M.D.   On: 07/31/2020 04:32   MR BRAIN WO CONTRAST  Result Date: 07/31/2020 CLINICAL DATA:  Initial evaluation for acute dizziness. EXAM: MRI HEAD WITHOUT CONTRAST TECHNIQUE: Multiplanar, multiecho pulse sequences of the brain and surrounding structures were obtained without intravenous contrast. COMPARISON:  Prior CT from 09/28/2019 FINDINGS: Brain: Generalized age-related cerebral atrophy. Patchy and confluent T2/FLAIR hyperintensity within the periventricular deep white matter both cerebral hemispheres most consistent with chronic small vessel ischemic disease, moderate in nature. No abnormal foci of restricted diffusion to suggest acute or subacute ischemia. Gray-white matter differentiation maintained. No areas of remote cortical infarction. No evidence for acute or chronic intracranial hemorrhage. No mass lesion, midline shift or mass effect. Mild ventricular prominence related to global parenchymal volume loss of hydrocephalus. No extra-axial fluid collection. Pituitary gland suprasellar region within normal limits. Midline structures intact. Vascular: Irregular flow void within the proximal right V4 segment, which could be related to slow flow and/or occlusion (series 10, image 1). Well preserved flow void seen distally within the right V4 segment. Mild FLAIR signal intensity within the right transverse and sigmoid sinuses  without definite T1 correlate, likely related to slow/sluggish flow. No other findings to suggest dural sinus thrombosis. Skull and upper cervical spine: Craniocervical junction within normal limits. Bone marrow signal intensity within normal limits. No scalp soft tissue abnormality. Sinuses/Orbits: Globes and orbital soft tissues within normal limits. Paranasal sinuses are largely clear. No significant mastoid effusion. Inner ear structures grossly normal. Other: None. IMPRESSION: 1. No acute intracranial abnormality. 2. Age-related cerebral atrophy with moderate chronic small vessel ischemic disease. 3. Irregular flow void within the proximal right V4 segment, which could be related to slow flow and/or occlusion. Major intracranial vascular flow voids otherwise well preserved. Electronically Signed   By: Jeannine Boga M.D.   On: 07/31/2020 01:14   MR BRAIN W CONTRAST  Result Date: 07/31/2020 CLINICAL DATA:  Initial evaluation for neuro deficit, stroke suspected, dizziness. EXAM: MRI HEAD WITH CONTRAST MRA HEAD WITHOUT CONTRAST MRA NECK WITHOUT AND WITH CONTRAST TECHNIQUE: Multiplanar, multi-echo pulse sequences of the brain and surrounding structures were acquired without intravenous contrast. Angiographic images of the Circle of Willis were acquired using MRA technique without intravenous contrast. Angiographic images of the neck were acquired using MRA technique without and with intravenous contrast. Carotid stenosis measurements (when applicable) are obtained utilizing NASCET criteria, using the distal internal carotid diameter as the denominator. CONTRAST:  66mL GADAVIST GADOBUTROL 1 MMOL/ML IV SOLN COMPARISON:  Prior noncontrast MRI from earlier the same day. FINDINGS: MRI  HEAD FINDINGS Postcontrast imaging of the brain demonstrates no abnormal or pathologic enhancement. No appreciable mass lesion. Normal intravascular enhancement seen throughout the major intracranial vascular structures. No new  finding. MRA HEAD FINDINGS ANTERIOR CIRCULATION: Visualized distal cervical segments of the internal carotid arteries widely patent with antegrade flow. Petrous, cavernous, and supraclinoid segments patent without stenosis or other abnormality. A1 segments patent bilaterally. Normal anterior communicating artery complex. Anterior cerebral arteries patent to their distal aspects without stenosis. No M1 stenosis or occlusion. Normal MCA bifurcations. Distal MCA branches well perfused and symmetric. POSTERIOR CIRCULATION: V4 segments patent without stenosis or other abnormality. Left PICA origin patent and normal. Right PICA not seen. Basilar widely patent to its distal aspect without stenosis. Superior cerebellar arteries patent bilaterally. Both PCAs primarily supplied via the basilar well perfused to their distal aspects. No intracranial aneurysm. MRA NECK FINDINGS AORTIC ARCH: Visualized aortic arch normal caliber with normal 3 vessel morphology. No hemodynamically significant stenosis seen about the origin of the great vessels. RIGHT CAROTID SYSTEM: Right common carotid artery patent from its origin to the bifurcation without stenosis. No significant atheromatous narrowing about the right bifurcation. Right ICA widely patent distally without stenosis, evidence for dissection or occlusion. LEFT CAROTID SYSTEM: Left CCA patent from its origin to the bifurcation without stenosis. 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 Left ICA patent distally without stenosis, evidence for dissection or occlusion. VERTEBRAL ARTERIES: Both vertebral arteries arise from the subclavian arteries. No proximal subclavian artery stenosis. Left vertebral artery slightly dominant. Vertebral arteries widely patent within the neck without stenosis, evidence for dissection or occlusion. There is a short-segment moderate to severe stenosis involving the right vertebral artery as it  crosses the dural reflection into the cranial vault (series 1078, image 9). This accounts for the signal abnormality seen on prior noncontrast MRI. IMPRESSION: MRI HEAD: Negative postcontrast MRI of the head. No abnormal enhancement or other new finding. MRA HEAD: Negative intracranial MRA with no large vessel occlusion or hemodynamically significant stenosis. MRA NECK: 1. 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 Otherwise wide patency of both carotid artery systems within the neck. 2. Short-segment moderate to severe stenosis involving the right vertebral artery as it crosses the dural reflection into the cranial vault. This accounts for the signal abnormality seen on prior noncontrast MRI. 3. Otherwise wide patency of both vertebral arteries within the neck. The left vertebral artery is dominant. Electronically Signed   By: Jeannine Boga M.D.   On: 07/31/2020 04:32   DG Hip Unilat W or Wo Pelvis 2-3 Views Left  Result Date: 07/31/2020 CLINICAL DATA:  Left hip pain EXAM: DG HIP (WITH OR WITHOUT PELVIS) 2-3V LEFT COMPARISON:  None. FINDINGS: There is no evidence of hip fracture or dislocation. Osteopenia. No significant degenerative changes. IMPRESSION: Negative for fracture. Electronically Signed   By: Monte Fantasia M.D.   On: 07/31/2020 05:35        Scheduled Meds: . aspirin EC  81 mg Oral Q breakfast  . atorvastatin  20 mg Oral Daily  . clotrimazole-betamethasone  1 application Topical BID  . enoxaparin (LOVENOX) injection  40 mg Subcutaneous Q24H  . insulin aspart  0-5 Units Subcutaneous QHS  . insulin aspart  0-9 Units Subcutaneous TID WC  . mirabegron ER  25 mg Oral Daily   Continuous Infusions: . lactated ringers 75 mL/hr at 08/01/20 0559     LOS:  0 days    Time spent: 13mins    Kathie Dike, MD [facility]   If 7PM-7AM, please contact night-coverage [email or web address]  08/01/2020, 9:25 AM

## 2020-08-01 NOTE — Progress Notes (Signed)
Initial Nutrition Assessment  DOCUMENTATION CODES:   Not applicable  INTERVENTION:   -Ensure Max po daily, each supplement provides 150 kcal and 30 grams of protein -MVI with minerals daily  NUTRITION DIAGNOSIS:   Inadequate oral intake related to decreased appetite as evidenced by per patient/family report.  GOAL:   Patient will meet greater than or equal to 90% of their needs  MONITOR:   PO intake,Supplement acceptance,Labs,Weight trends,Skin,I & O's  REASON FOR ASSESSMENT:   Malnutrition Screening Tool    ASSESSMENT:   [name] is a 65 y.o. male with medical history significant of noninsulin-dependent type 2 diabetes, hypertension, hyperlipidemia, prostate cancer, prior nephrolithiasis and chronic right hydronephrosis followed by urology, CKD stage III presented to the ED via EMS with complaints of chest pain, dizziness, and unsteady gait.  Pt admitted with dizziness and unsteady gait.   Reviewed I/O's: -1.4 L x 24 hours and -1 L since admission  UOP: 1.5 L x 24 hours  Spoke with pt at bedside, who reports he is feeling better today. He reports he has a fair appetite; upset that he was not delivered breakfast today, but consumed about 75% of his lunch.   Per pt, he usually eats 2 meals per day (Breakfast: cereal OR eggs, sausage, and grits; Lunch: [place] sandwich and doritos). He also admits to snacking on oatmeal cookies, chips, and popcorn throughout the day. He estimates he consumes 1-2 Premier Protein shakes daily.   Pt reports intentional weight loss of 14 pounds, which he attributes to DM medications and decreasing sugary snacks in his diet. Reviewed wt hx; wt has been stable over the past 6 months.   Medications reviewed and include lactated ringers infusion @ 75 ml/hr.   Lab Results  Component Value Date   HGBA1C 8.1 (H) 07/17/2020   PTA DM medications are 14 mg semaglutide daily. Discussed DM control at home. He denies any difficulty obtaining or  taking medications. He checks CBGS about twice a week and readings usually range between 140-160. Pt shares that he was diagnosed with DM many years ago, but did not take it seriously. He reports that he intends to try and choose healthier food choices and reports "I know my blood sugar is bad and that's what's affected my brain".   Discussed importance of good meal and supplement intake to promote healing.   Labs reviewed: CBGS: 147-152 (inpatient orders for glycemic control are 0-5 units insulin aspart daily at bedtime and 0-9 units insulin aspart TID with meals).   NUTRITION - FOCUSED PHYSICAL EXAM:  Flowsheet Row Most Recent Value  Orbital Region No depletion  Upper Arm Region No depletion  Buccal Region No depletion  Temple Region No depletion  Clavicle Bone Region No depletion  Scapular Bone Region No depletion  Dorsal Hand No depletion  Patellar Region No depletion  Anterior Thigh Region No depletion  Posterior Calf Region No depletion  Edema (RD Assessment) None  Hair Reviewed  Eyes Reviewed  Mouth Reviewed  Skin Reviewed  Nails Reviewed       Diet Order:   Diet Order            Diet heart healthy/carb modified Room service appropriate? No; Fluid consistency: Thin  Diet effective now                 EDUCATION NEEDS:   Education needs have been addressed  Skin:  Skin Assessment: Reviewed RN Assessment  Last BM:  Unknown  Height:   Ht  Readings from Last 1 Encounters:  07/30/20 5\' 6"  (1.676 m)    Weight:   Wt Readings from Last 1 Encounters:  07/30/20 78.9 kg    Ideal Body Weight:  59.1 kg  BMI:  Body mass index is 28.08 kg/m.  Estimated Nutritional Needs:   Kcal:  1950-2150  Protein:  100-115 grams  Fluid:  > 1.9 L    Loistine Chance, RD, LDN, [place] Registered Dietitian II Certified Diabetes Care and Education Specialist Please refer to [facility] for RD and/or RD on-call/weekend/after hours pager

## 2020-08-01 NOTE — Progress Notes (Signed)
MRI C-spine with multilevel DJD and other degenerative changes but no evidence of myelopathy. Recommendations as in the progress note from earlier this morning. Discussed with Dr. Roderic Palau  -- Amie Portland, MD Neurologist [facility] Pager: [phone]

## 2020-08-01 NOTE — Progress Notes (Signed)
Pt refusing assistance, being belligerent about getting baths multiple times a day, insisting that he be left alone during bathing but not actively working on bathing, simply sitting naked in the chair in front of the sink. Wont move back to bed. Wont wash with anyone in the room. Will alert physician.

## 2020-08-02 DIAGNOSIS — N1831 Chronic kidney disease, stage 3a: Secondary | ICD-10-CM | POA: Diagnosis not present

## 2020-08-02 DIAGNOSIS — I10 Essential (primary) hypertension: Secondary | ICD-10-CM

## 2020-08-02 DIAGNOSIS — R262 Difficulty in walking, not elsewhere classified: Secondary | ICD-10-CM | POA: Diagnosis not present

## 2020-08-02 DIAGNOSIS — E1121 Type 2 diabetes mellitus with diabetic nephropathy: Secondary | ICD-10-CM | POA: Diagnosis not present

## 2020-08-02 DIAGNOSIS — R6889 Other general symptoms and signs: Secondary | ICD-10-CM | POA: Diagnosis not present

## 2020-08-02 DIAGNOSIS — R270 Ataxia, unspecified: Secondary | ICD-10-CM

## 2020-08-02 DIAGNOSIS — R404 Transient alteration of awareness: Secondary | ICD-10-CM | POA: Diagnosis not present

## 2020-08-02 DIAGNOSIS — R079 Chest pain, unspecified: Secondary | ICD-10-CM | POA: Diagnosis not present

## 2020-08-02 DIAGNOSIS — R260 Ataxic gait: Secondary | ICD-10-CM | POA: Diagnosis not present

## 2020-08-02 DIAGNOSIS — R42 Dizziness and giddiness: Secondary | ICD-10-CM | POA: Diagnosis not present

## 2020-08-02 DIAGNOSIS — R2681 Unsteadiness on feet: Secondary | ICD-10-CM | POA: Diagnosis not present

## 2020-08-02 DIAGNOSIS — M25559 Pain in unspecified hip: Secondary | ICD-10-CM | POA: Diagnosis not present

## 2020-08-02 DIAGNOSIS — Z743 Need for continuous supervision: Secondary | ICD-10-CM | POA: Diagnosis not present

## 2020-08-02 DIAGNOSIS — N183 Chronic kidney disease, stage 3 unspecified: Secondary | ICD-10-CM | POA: Diagnosis not present

## 2020-08-02 DIAGNOSIS — E785 Hyperlipidemia, unspecified: Secondary | ICD-10-CM | POA: Diagnosis not present

## 2020-08-02 LAB — GLUCOSE, CAPILLARY
Glucose-Capillary: 291 mg/dL — ABNORMAL HIGH (ref 70–99)
Glucose-Capillary: 248 mg/dL — ABNORMAL HIGH (ref 70–99)
Glucose-Capillary: 149 mg/dL — ABNORMAL HIGH (ref 70–99)

## 2020-08-02 LAB — RESP PANEL BY RT-PCR (FLU A&B, COVID) ARPGX2
Influenza A by PCR: NEGATIVE
Influenza B by PCR: NEGATIVE
SARS Coronavirus 2 by RT PCR: NEGATIVE

## 2020-08-02 MED ORDER — IRBESARTAN 300 MG PO TABS
300.0000 mg | ORAL_TABLET | Freq: Every day | ORAL | Status: DC
  Administered 2020-08-02 – 2020-08-03 (×2): 300 mg via ORAL
  Filled 2020-08-02 (×2): qty 1

## 2020-08-02 MED ORDER — METOPROLOL TARTRATE 50 MG PO TABS
50.0000 mg | ORAL_TABLET | Freq: Two times a day (BID) | ORAL | Status: DC
  Administered 2020-08-02 – 2020-08-03 (×3): 50 mg via ORAL
  Filled 2020-08-02 (×3): qty 1

## 2020-08-02 MED ORDER — TRAMADOL HCL 50 MG PO TABS: 50.0000 mg | ORAL_TABLET | Freq: Four times a day (QID) | ORAL | 0 refills | Status: DC | PRN

## 2020-08-02 MED ORDER — OLMESARTAN MEDOXOMIL-HCTZ 40-25 MG PO TABS: 1.0000 | ORAL_TABLET | Freq: Every day | ORAL | Status: DC

## 2020-08-02 MED ORDER — HYDROCHLOROTHIAZIDE 25 MG PO TABS
25.0000 mg | ORAL_TABLET | Freq: Every day | ORAL | Status: DC
  Administered 2020-08-02 – 2020-08-03 (×2): 25 mg via ORAL
  Filled 2020-08-02 (×2): qty 1

## 2020-08-02 MED ORDER — LABETALOL HCL 5 MG/ML IV SOLN
20.0000 mg | INTRAVENOUS | Status: DC | PRN
  Administered 2020-08-02 (×2): 20 mg via INTRAVENOUS
  Filled 2020-08-02 (×2): qty 4

## 2020-08-02 NOTE — Progress Notes (Signed)
Patient's blood pressure is 195/102 and patient is asymptomatic.  Dr. Eugenie Norrie notified via text page and new orders were place in [facility] and carried out.  Nursing staff to continue to monitor.

## 2020-08-02 NOTE — Progress Notes (Signed)
Upon [facility] arrival hey checked the pt's vitals and his systolic blood pressure was in the 200s. [facility] explained to oncoming RN facility would not take pt with an elevated BP. The last BP taken was 193/110. As this was taking place, this RN was calling report to nurse Colletta Maryland and she stated "We will send him right back if his blood pressure is high like that. What else are we going to give him for his BP other than the daily metoprolol?" This RN made her aware that the on call physician would be paged to see what the plan should be now that his blood pressure is significantly elevated. Dr. Tonie Griffith made aware of situation, instructed to check a manual, hold the discharge for tonight and address in the morning. Night shift RN made aware of the plan and will give prn med once new IV is placed.

## 2020-08-02 NOTE — Progress Notes (Signed)
Notified by RN that pt was discharged earlier today but the transport and receiving facility refused to take pt tonight with BP being over 200/100 now.  RN to retake BP manually. Will provide prn antihypertensives as ordered  Discharge held until am

## 2020-08-02 NOTE — Discharge Summary (Signed)
Physician Discharge Summary  [name] IRW:[id number] DOB: 1956/03/03 DOA: 07/30/2020  PCP: [name], [name]  Admit date: 07/30/2020 Discharge date: 08/02/2020  Admitted From: Home Disposition: SNF  Recommendations for Outpatient Follow-up:  1. Follow up with PCP in 1-2 weeks 2. Follow up with Neurology in 6-8 weeks as an outpatient  3. Please obtain CMP/CBC, Mag, Phos in one week 4. Please follow up on the following pending results:  Home Health: No Equipment/Devices: None  Discharge Condition: Stable CODE STATUS: FULL CODE  Diet recommendation: Heart Healthy Carb Modified Diet   Brief/Interim Summary: Patient is a 65 year old overweight male with a past medical history significant for but not limited to non-insulin-dependent diabetes mellitus type 2, hypertension, hyperlipidemia, history of prostate cancer, history of prior nephrolithiasis and chronic right hydronephrosis followed by urology, chronic kidney disease stage IIIa as well as other comorbidities who presented to the ED via EMS with a chief complaint of chest pain, dizziness and unsteady gait.  He is given 324 mg by EMS and in the ED he was very unsteady when ambulating and fell trying to use the urinal and landed on his left hip.  Initial work-up showed a WBC of 7.9 hemoglobin 18.4 albumin/creatinine of 27/1.17.  EKG was without any ischemic changes and a COVID and influenza testing was negative.  He had an MRI of his brain which was negative for acute stroke but did show irregular flow void within the proximal right V4 segment which could be related to slow flow or occlusion.  The major intracranial vascular flow voids otherwise that were well preserved.  Neurology was consulted given his dizziness and unsteady gait and they recommended further imaging with MRA of the head with contrast and MRA of the neck which showed an atheromatous stenosis of 40% at the origin of the left ICA otherwise widely patent both carotid artery  systems within the neck and a short segment moderate to severe stenosis of the right vertebral artery as it crosses the dual section to the cranial vault and corroborates with signal abnormality seen on prior noncontrast MRI.  Patient had both wide patency of both vertebral arteries within the neck and a dominant left vertebral artery.  Patient reportedly had 3 weeks of dizziness and lightheadedness and unsteady gait and states he felt "drunk".  Further work-up was done by neurology and PT OT recommending SNF.  Patient had an MRI of the C-spine which showed multilevel DJD and other degenerative changes but no evidence of myelopathy.  Neurology felt that patient's worsening gait difficulty was likely secondary to sensory neuropathy given his diabetes with complications and recommended continuing B12 replenishment and keeping levels always above 400 and continue aspirin and statin.  They also recommended strict glucose control with a hemoglobin A1c less than 7.  He is deemed medically stable after her neurology cleared him and will need SNF for further PT OT and outpatient follow-up with neurology in 6 to 8 weeks.  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 which is uncontrolled -He had further work-up as above and neurology was consulted  -TSH was 0.688 and U/A Negative  -B12 level was 405 and neurology recommending keeping the B12 levels greater than 400 OS -Ethanol level is negative -UDS negative -PT  OT recommending SNF and awaiting placement  -Patient will need outpatient follow-up with neurology in 6 to 8 weeks  Atypical chest pain rule out ACS -He had cardiac enzymes are negative no EKG changes -Currently is chest pain-free and recommendation is to continue  aspirin and statin  -Outpatient follow-up with cardiology if warranted  Uncontrolled noninsulin-dependent diabetes mellitus type 2 associated with hyperglycemia and neuropathy -Hemoglobin A1c was 8.1 -C/w Sensitive Novolog SSI AC/HS whiile hospitalzied -Holding Semaglutide 14 mg po Daily  -CBG's ranging from 134-291  Hypertension -We will resume antihypertensives now as he takes Metoprolol Tartrate 50 mg po BID and Olmesartan-HCTZ 40-25 mg po DAIly  -C/w Labetolol 20 mg IV q3hprn for SBP>160 or DBP>100 -Continue to Monitor BP per Protocol -Last BP was elevated at 168/91  Hyperlipidemia -Continue with Atorvastatin 20 g p.o. daily  AKI on CKD stage IIIa -Was noted to be mildly dehydrated on admission -Started IV fluid hydration with LR at 75 mL/hr and his BUN/creatinine improved and went from 27/1.77 -> 26/1.48 -Check orthostatics prior to D/C  -We will continue IV fluid hydration until Discharge -Resuming Olmesartan-HCTZ -Avoid further nephrotoxic medications, contrast dyes, hypotension and renally adjust medication -Repeat CMP within 1 week   Left Hip Pain -X-rays were negative for injury or fracture as he fell on admission    Discharge Instructions  Discharge Instructions    Call MD for:  difficulty breathing, headache or visual disturbances   Complete by: As directed    Call MD for:  extreme fatigue   Complete by: As directed    Call MD for:  hives   Complete by: As directed    Call MD for:  persistant dizziness or light-headedness   Complete by: As directed    Call MD for:  persistant nausea and vomiting   Complete by: As directed    Call MD for:  redness, tenderness, or signs of infection (pain, swelling, redness, odor or green/yellow discharge around incision site)   Complete by: As directed    Call MD for:  severe uncontrolled pain   Complete by: As directed    Call MD for:  temperature >100.4   Complete by: As directed    Diet - low sodium heart healthy    Complete by: As directed    Diet Carb Modified   Complete by: As directed    Discharge instructions   Complete by: As directed    You were cared for by a hospitalist during your hospital stay. If you have any questions about your discharge medications or the care you received while you were in the hospital after you are discharged, you can call the unit and ask to speak with the hospitalist on call if the hospitalist that took care of you is not available. Once you are discharged, your primary care physician will handle any further medical issues. Please note that NO REFILLS for any discharge medications will be authorized once you are discharged, as it is imperative that you return to your primary care physician (or establish a relationship with a primary care physician if you do not have one) for your aftercare needs so that they can reassess your need for medications and monitor your lab values.  Follow up with PCP and Neurology within 1-2 weeks. Take all medications as prescribed. If symptoms change or worsen please return to the ED for evaluation   Increase activity slowly   Complete by: As directed      Allergies as of 08/02/2020  No Known Allergies     Medication List    STOP taking these medications   ibuprofen 200 MG tablet Commonly known as: ADVIL     TAKE these medications   acetaminophen 325 MG tablet Commonly known as: TYLENOL Take 2 tablets (650 mg total) by mouth every 6 (six) hours as needed for mild pain (or Fever >/= 101).   aspirin EC 81 MG tablet Take 81 mg by mouth daily with breakfast.   atorvastatin 20 MG tablet Commonly known as: LIPITOR Take 1 tablet (20 mg total) by mouth daily.   clotrimazole-betamethasone cream Commonly known as: LOTRISONE Apply 1 application topically 2 (two) times daily. What changed:   when to take this  reasons to take this   diphenhydrAMINE 25 MG tablet Commonly known as: BENADRYL Take 25 mg by mouth every 6 (six) hours  as needed for allergies.   metoprolol tartrate 50 MG tablet Commonly known as: LOPRESSOR TAKE 1 TABLET BY MOUTH EVERY DAY What changed: when to take this   Myrbetriq 25 MG Tb24 tablet Generic drug: mirabegron ER TAKE 1 TABLET BY MOUTH EVERY DAY What changed: how much to take   nystatin powder Commonly known as: nystatin Apply 1 application topically 3 (three) times daily. What changed:   when to take this  reasons to take this   olmesartan-hydrochlorothiazide 40-25 MG tablet Commonly known as: BENICAR HCT Take 1 tablet by mouth daily.   Rybelsus 14 MG Tabs Generic drug: Semaglutide Take 1 tablet by mouth daily. 30 minutes before breakfast What changed: how much to take   sildenafil 20 MG tablet Commonly known as: REVATIO TAKE 1 TABLET BY MOUTH EVERY DAY (PRIOR AUTHORIZATION DENIED) What changed:   how much to take  how to take this  when to take this  reasons to take this  additional instructions   traMADol 50 MG tablet Commonly known as: ULTRAM Take 1 tablet (50 mg total) by mouth every 6 (six) hours as needed for severe pain.   traZODone 50 MG tablet Commonly known as: DESYREL TAKE 1 TABLET (50 MG TOTAL) BY MOUTH AT BEDTIME AS NEEDED FOR SLEEP.   VISINE OP Place 1 drop into both eyes daily as needed (itching/irritation).       Contact information for after-discharge care    Destination    [facility] Preferred SNF .   Service: Skilled Nursing Contact information: [place] [phone]                 No Known Allergies  Consultations:  Neurology   Procedures/Studies: DG Chest 2 View  Result Date: 07/30/2020 CLINICAL DATA:  Chest pain shortness of breath EXAM: CHEST - 2 VIEW COMPARISON:  February 10, 2020 FINDINGS: The heart size and mediastinal contours are within normal limits. Low lung volumes with bibasilar atelectasis. Stable blunting of the cardiophrenic angle at  the left lung base. No visible pleural effusion or pneumothorax. The visualized skeletal structures are unremarkable. IMPRESSION: No active cardiopulmonary disease. Electronically Signed   By: Dahlia Bailiff MD   On: 07/30/2020 21:26   MR ANGIO HEAD WO CONTRAST  Result Date: 07/31/2020 CLINICAL DATA:  Initial evaluation for neuro deficit, stroke suspected, dizziness. EXAM: MRI HEAD WITH CONTRAST MRA HEAD WITHOUT CONTRAST MRA NECK WITHOUT AND WITH CONTRAST TECHNIQUE: Multiplanar, multi-echo pulse sequences of the brain and surrounding structures were acquired without intravenous contrast. Angiographic images of the Circle of Willis were acquired using MRA technique without intravenous contrast.  Angiographic images of the neck were acquired using MRA technique without and with intravenous contrast. Carotid stenosis measurements (when applicable) are obtained utilizing NASCET criteria, using the distal internal carotid diameter as the denominator. CONTRAST:  63mL GADAVIST GADOBUTROL 1 MMOL/ML IV SOLN COMPARISON:  Prior noncontrast MRI from earlier the same day. FINDINGS: MRI HEAD FINDINGS Postcontrast imaging of the brain demonstrates no abnormal or pathologic enhancement. No appreciable mass lesion. Normal intravascular enhancement seen throughout the major intracranial vascular structures. No new finding. MRA HEAD FINDINGS ANTERIOR CIRCULATION: Visualized distal cervical segments of the internal carotid arteries widely patent with antegrade flow. Petrous, cavernous, and supraclinoid segments patent without stenosis or other abnormality. A1 segments patent bilaterally. Normal anterior communicating artery complex. Anterior cerebral arteries patent to their distal aspects without stenosis. No M1 stenosis or occlusion. Normal MCA bifurcations. Distal MCA branches well perfused and symmetric. POSTERIOR CIRCULATION: V4 segments patent without stenosis or other abnormality. Left PICA origin patent and normal. Right PICA  not seen. Basilar widely patent to its distal aspect without stenosis. Superior cerebellar arteries patent bilaterally. Both PCAs primarily supplied via the basilar well perfused to their distal aspects. No intracranial aneurysm. MRA NECK FINDINGS AORTIC ARCH: Visualized aortic arch normal caliber with normal 3 vessel morphology. No hemodynamically significant stenosis seen about the origin of the great vessels. RIGHT CAROTID SYSTEM: Right common carotid artery patent from its origin to the bifurcation without stenosis. No significant atheromatous narrowing about the right bifurcation. Right ICA widely patent distally without stenosis, evidence for dissection or occlusion. LEFT CAROTID SYSTEM: Left CCA patent from its origin to the bifurcation without stenosis. 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 Left ICA patent distally without stenosis, evidence for dissection or occlusion. VERTEBRAL ARTERIES: Both vertebral arteries arise from the subclavian arteries. No proximal subclavian artery stenosis. Left vertebral artery slightly dominant. Vertebral arteries widely patent within the neck without stenosis, evidence for dissection or occlusion. There is a short-segment moderate to severe stenosis involving the right vertebral artery as it crosses the dural reflection into the cranial vault (series 1078, image 9). This accounts for the signal abnormality seen on prior noncontrast MRI. IMPRESSION: MRI HEAD: Negative postcontrast MRI of the head. No abnormal enhancement or other new finding. MRA HEAD: Negative intracranial MRA with no large vessel occlusion or hemodynamically significant stenosis. MRA NECK: 1. 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 Otherwise wide patency of both carotid artery systems within the neck. 2. Short-segment moderate to severe stenosis involving the right vertebral artery as it crosses the dural  reflection into the cranial vault. This accounts for the signal abnormality seen on prior noncontrast MRI. 3. Otherwise wide patency of both vertebral arteries within the neck. The left vertebral artery is dominant. Electronically Signed   By: Jeannine Boga M.D.   On: 07/31/2020 04:32   MR ANGIO NECK W WO CONTRAST  Result Date: 07/31/2020 CLINICAL DATA:  Initial evaluation for neuro deficit, stroke suspected, dizziness. EXAM: MRI HEAD WITH CONTRAST MRA HEAD WITHOUT CONTRAST MRA NECK WITHOUT AND WITH CONTRAST TECHNIQUE: Multiplanar, multi-echo pulse sequences of the brain and surrounding structures were acquired without intravenous contrast. Angiographic images of the Circle of Willis were acquired using MRA technique without intravenous contrast. Angiographic images of the neck were acquired using MRA technique without and with intravenous contrast. Carotid stenosis measurements (when applicable) are obtained utilizing NASCET criteria, using the distal internal carotid diameter as  the denominator. CONTRAST:  38mL GADAVIST GADOBUTROL 1 MMOL/ML IV SOLN COMPARISON:  Prior noncontrast MRI from earlier the same day. FINDINGS: MRI HEAD FINDINGS Postcontrast imaging of the brain demonstrates no abnormal or pathologic enhancement. No appreciable mass lesion. Normal intravascular enhancement seen throughout the major intracranial vascular structures. No new finding. MRA HEAD FINDINGS ANTERIOR CIRCULATION: Visualized distal cervical segments of the internal carotid arteries widely patent with antegrade flow. Petrous, cavernous, and supraclinoid segments patent without stenosis or other abnormality. A1 segments patent bilaterally. Normal anterior communicating artery complex. Anterior cerebral arteries patent to their distal aspects without stenosis. No M1 stenosis or occlusion. Normal MCA bifurcations. Distal MCA branches well perfused and symmetric. POSTERIOR CIRCULATION: V4 segments patent without stenosis or  other abnormality. Left PICA origin patent and normal. Right PICA not seen. Basilar widely patent to its distal aspect without stenosis. Superior cerebellar arteries patent bilaterally. Both PCAs primarily supplied via the basilar well perfused to their distal aspects. No intracranial aneurysm. MRA NECK FINDINGS AORTIC ARCH: Visualized aortic arch normal caliber with normal 3 vessel morphology. No hemodynamically significant stenosis seen about the origin of the great vessels. RIGHT CAROTID SYSTEM: Right common carotid artery patent from its origin to the bifurcation without stenosis. No significant atheromatous narrowing about the right bifurcation. Right ICA widely patent distally without stenosis, evidence for dissection or occlusion. LEFT CAROTID SYSTEM: Left CCA patent from its origin to the bifurcation without stenosis. 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 Left ICA patent distally without stenosis, evidence for dissection or occlusion. VERTEBRAL ARTERIES: Both vertebral arteries arise from the subclavian arteries. No proximal subclavian artery stenosis. Left vertebral artery slightly dominant. Vertebral arteries widely patent within the neck without stenosis, evidence for dissection or occlusion. There is a short-segment moderate to severe stenosis involving the right vertebral artery as it crosses the dural reflection into the cranial vault (series 1078, image 9). This accounts for the signal abnormality seen on prior noncontrast MRI. IMPRESSION: MRI HEAD: Negative postcontrast MRI of the head. No abnormal enhancement or other new finding. MRA HEAD: Negative intracranial MRA with no large vessel occlusion or hemodynamically significant stenosis. MRA NECK: 1. 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 Otherwise wide patency of both carotid artery systems within the neck. 2. Short-segment moderate to severe  stenosis involving the right vertebral artery as it crosses the dural reflection into the cranial vault. This accounts for the signal abnormality seen on prior noncontrast MRI. 3. Otherwise wide patency of both vertebral arteries within the neck. The left vertebral artery is dominant. Electronically Signed   By: Jeannine Boga M.D.   On: 07/31/2020 04:32   MR BRAIN WO CONTRAST  Result Date: 07/31/2020 CLINICAL DATA:  Initial evaluation for acute dizziness. EXAM: MRI HEAD WITHOUT CONTRAST TECHNIQUE: Multiplanar, multiecho pulse sequences of the brain and surrounding structures were obtained without intravenous contrast. COMPARISON:  Prior CT from 09/28/2019 FINDINGS: Brain: Generalized age-related cerebral atrophy. Patchy and confluent T2/FLAIR hyperintensity within the periventricular deep white matter both cerebral hemispheres most consistent with chronic small vessel ischemic disease, moderate in nature. No abnormal foci of restricted diffusion to suggest acute or subacute ischemia. Gray-white matter differentiation maintained. No areas of remote cortical infarction. No evidence for acute or chronic intracranial hemorrhage. No mass lesion, midline shift or mass effect. Mild ventricular prominence related to global parenchymal volume loss of hydrocephalus. No extra-axial fluid collection. Pituitary gland suprasellar region  within normal limits. Midline structures intact. Vascular: Irregular flow void within the proximal right V4 segment, which could be related to slow flow and/or occlusion (series 10, image 1). Well preserved flow void seen distally within the right V4 segment. Mild FLAIR signal intensity within the right transverse and sigmoid sinuses without definite T1 correlate, likely related to slow/sluggish flow. No other findings to suggest dural sinus thrombosis. Skull and upper cervical spine: Craniocervical junction within normal limits. Bone marrow signal intensity within normal limits. No  scalp soft tissue abnormality. Sinuses/Orbits: Globes and orbital soft tissues within normal limits. Paranasal sinuses are largely clear. No significant mastoid effusion. Inner ear structures grossly normal. Other: None. IMPRESSION: 1. No acute intracranial abnormality. 2. Age-related cerebral atrophy with moderate chronic small vessel ischemic disease. 3. Irregular flow void within the proximal right V4 segment, which could be related to slow flow and/or occlusion. Major intracranial vascular flow voids otherwise well preserved. Electronically Signed   By: Jeannine Boga M.D.   On: 07/31/2020 01:14   MR BRAIN W CONTRAST  Result Date: 07/31/2020 CLINICAL DATA:  Initial evaluation for neuro deficit, stroke suspected, dizziness. EXAM: MRI HEAD WITH CONTRAST MRA HEAD WITHOUT CONTRAST MRA NECK WITHOUT AND WITH CONTRAST TECHNIQUE: Multiplanar, multi-echo pulse sequences of the brain and surrounding structures were acquired without intravenous contrast. Angiographic images of the Circle of Willis were acquired using MRA technique without intravenous contrast. Angiographic images of the neck were acquired using MRA technique without and with intravenous contrast. Carotid stenosis measurements (when applicable) are obtained utilizing NASCET criteria, using the distal internal carotid diameter as the denominator. CONTRAST:  16mL GADAVIST GADOBUTROL 1 MMOL/ML IV SOLN COMPARISON:  Prior noncontrast MRI from earlier the same day. FINDINGS: MRI HEAD FINDINGS Postcontrast imaging of the brain demonstrates no abnormal or pathologic enhancement. No appreciable mass lesion. Normal intravascular enhancement seen throughout the major intracranial vascular structures. No new finding. MRA HEAD FINDINGS ANTERIOR CIRCULATION: Visualized distal cervical segments of the internal carotid arteries widely patent with antegrade flow. Petrous, cavernous, and supraclinoid segments patent without stenosis or other abnormality. A1 segments  patent bilaterally. Normal anterior communicating artery complex. Anterior cerebral arteries patent to their distal aspects without stenosis. No M1 stenosis or occlusion. Normal MCA bifurcations. Distal MCA branches well perfused and symmetric. POSTERIOR CIRCULATION: V4 segments patent without stenosis or other abnormality. Left PICA origin patent and normal. Right PICA not seen. Basilar widely patent to its distal aspect without stenosis. Superior cerebellar arteries patent bilaterally. Both PCAs primarily supplied via the basilar well perfused to their distal aspects. No intracranial aneurysm. MRA NECK FINDINGS AORTIC ARCH: Visualized aortic arch normal caliber with normal 3 vessel morphology. No hemodynamically significant stenosis seen about the origin of the great vessels. RIGHT CAROTID SYSTEM: Right common carotid artery patent from its origin to the bifurcation without stenosis. No significant atheromatous narrowing about the right bifurcation. Right ICA widely patent distally without stenosis, evidence for dissection or occlusion. LEFT CAROTID SYSTEM: Left CCA patent from its origin to the bifurcation without stenosis. 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 Left ICA patent distally without stenosis, evidence for dissection or occlusion. VERTEBRAL ARTERIES: Both vertebral arteries arise from the subclavian arteries. No proximal subclavian artery stenosis. Left vertebral artery slightly dominant. Vertebral arteries widely patent within the neck without stenosis, evidence for dissection or occlusion. There is a short-segment moderate to severe stenosis involving the right vertebral artery as it crosses the dural reflection into  the cranial vault (series 1078, image 9). This accounts for the signal abnormality seen on prior noncontrast MRI. IMPRESSION: MRI HEAD: Negative postcontrast MRI of the head. No abnormal enhancement or other new finding. MRA  HEAD: Negative intracranial MRA with no large vessel occlusion or hemodynamically significant stenosis. MRA NECK: 1. 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 Otherwise wide patency of both carotid artery systems within the neck. 2. Short-segment moderate to severe stenosis involving the right vertebral artery as it crosses the dural reflection into the cranial vault. This accounts for the signal abnormality seen on prior noncontrast MRI. 3. Otherwise wide patency of both vertebral arteries within the neck. The left vertebral artery is dominant. Electronically Signed   By: Jeannine Boga M.D.   On: 07/31/2020 04:32   MR CERVICAL SPINE W WO CONTRAST  Result Date: 08/01/2020 CLINICAL DATA:  Difficulty walking for the past 1-2 weeks. EXAM: MRI CERVICAL SPINE WITHOUT AND WITH CONTRAST TECHNIQUE: Multiplanar and multiecho pulse sequences of the cervical spine, to include the craniocervical junction and cervicothoracic junction, were obtained without and with intravenous contrast. CONTRAST:  7.8mL GADAVIST GADOBUTROL 1 MMOL/ML IV SOLN COMPARISON:  CT cervical spine dated April 18, 2008. FINDINGS: Alignment: Trace anterolisthesis at C4-C5. Vertebrae: No fracture, evidence of discitis, or bone lesion. Cord: Normal signal and morphology.  No intradural enhancement. Posterior Fossa, vertebral arteries, paraspinal tissues: Negative. Disc levels: C2-C3: Negative disc. Mild left facet uncovertebral hypertrophy. No stenosis. C3-C4: Small posterior disc osteophyte complex. Moderate right and mild left facet uncovertebral hypertrophy. Moderate right and mild left neuroforaminal stenosis. C4-C5: Small shallow central disc protrusion. Mild bilateral uncovertebral hypertrophy. Moderate right facet arthropathy with periarticular marrow edema. Mild left facet arthropathy. Mild bilateral neuroforaminal stenosis. C5-C6: Tiny shallow central disc protrusion. Mild bilateral facet  arthropathy. No stenosis. C6-C7:  Negative. C7-T1:  Negative. IMPRESSION: 1. Normal cervical spinal cord. 2. Multilevel cervical spondylosis as described above, worst at C3-C4 where there is moderate right neuroforaminal stenosis. 3. Moderate right facet arthropathy at C4-C5 with acute inflammatory changes. Electronically Signed   By: Titus Dubin M.D.   On: 08/01/2020 13:12   DG Hip Unilat W or Wo Pelvis 2-3 Views Left  Result Date: 07/31/2020 CLINICAL DATA:  Left hip pain EXAM: DG HIP (WITH OR WITHOUT PELVIS) 2-3V LEFT COMPARISON:  None. FINDINGS: There is no evidence of hip fracture or dislocation. Osteopenia. No significant degenerative changes. IMPRESSION: Negative for fracture. Electronically Signed   By: Monte Fantasia M.D.   On: 07/31/2020 05:35    Subjective: Seen and examined at bedside and was feeling well and states he is no longer feeling dizzy or off balance as much.  No chest pain or shortness of breath.  Feels back to his baseline and wanting to get some rehab.  No other concerns or complaints at this time.   Discharge Exam: Vitals:   08/02/20 0557 08/02/20 0732  BP: (!) 195/102 (!) 168/91  Pulse: 64 65  Resp: 17   Temp: 98 F (36.7 C)   SpO2: 99%    Vitals:   08/01/20 0755 08/01/20 2000 08/02/20 0557 08/02/20 0732  BP: (!) 161/96 (!) 166/95 (!) 195/102 (!) 168/91  Pulse: 72 66 64 65  Resp: 18 18 17    Temp: 97.6 F (36.4 C) 97.9 F (36.6 C) 98 F (36.7 C)   TempSrc: Oral Oral Oral   SpO2:  91% 99%   Weight:      Height:  General: Pt is alert, awake, not in acute distress Cardiovascular: RRR, S1/S2 +, no rubs, no gallops Respiratory: Diminished slightly bilaterally, no wheezing, no rhonchi; unlabored breathing and not wearing supplemental oxygen via nasal cannula Abdominal: Soft, NT, distended secondary to body habitus, bowel sounds + Extremities: Very minimal edema, no cyanosis  The results of significant diagnostics from this hospitalization  (including imaging, microbiology, ancillary and laboratory) are listed below for reference.    Microbiology: Recent Results (from the past 240 hour(s))  Resp Panel by RT-PCR (Flu A&B, Covid) Nasopharyngeal Swab     Status: None   Collection Time: 07/30/20  7:26 PM   Specimen: Nasopharyngeal Swab; Nasopharyngeal(NP) swabs in vial transport medium  Result Value Ref Range Status   SARS Coronavirus 2 by RT PCR NEGATIVE NEGATIVE Final    Comment: (NOTE) SARS-CoV-2 target nucleic acids are NOT DETECTED.  The SARS-CoV-2 RNA is generally detectable in upper respiratory specimens during the acute phase of infection. The lowest concentration of SARS-CoV-2 viral copies this assay can detect is 138 copies/mL. A negative result does not preclude SARS-Cov-2 infection and should not be used as the sole basis for treatment or other patient management decisions. A negative result may occur with  improper specimen collection/handling, submission of specimen other than nasopharyngeal swab, presence of viral mutation(s) within the areas targeted by this assay, and inadequate number of viral copies(<138 copies/mL). A negative result must be combined with clinical observations, patient history, and epidemiological information. The expected result is Negative.  Fact Sheet for Patients:  EntrepreneurPulse.com.au  Fact Sheet for Healthcare Providers:  IncredibleEmployment.be  This test is no t yet approved or cleared by the [place] FDA and  has been authorized for detection and/or diagnosis of SARS-CoV-2 by FDA under an Emergency Use Authorization (EUA). This EUA will remain  in effect (meaning this test can be used) for the duration of the COVID-19 declaration under Section 564(b)(1) of the Act, 21 U.S.C.section 360bbb-3(b)(1), unless the authorization is terminated  or revoked sooner.       Influenza A by PCR NEGATIVE NEGATIVE Final   Influenza B by PCR  NEGATIVE NEGATIVE Final    Comment: (NOTE) The Xpert Xpress SARS-CoV-2/FLU/RSV plus assay is intended as an aid in the diagnosis of influenza from Nasopharyngeal swab specimens and should not be used as a sole basis for treatment. Nasal washings and aspirates are unacceptable for Xpert Xpress SARS-CoV-2/FLU/RSV testing.  Fact Sheet for Patients: EntrepreneurPulse.com.au  Fact Sheet for Healthcare Providers: IncredibleEmployment.be  This test is not yet approved or cleared by the [place] FDA and has been authorized for detection and/or diagnosis of SARS-CoV-2 by FDA under an Emergency Use Authorization (EUA). This EUA will remain in effect (meaning this test can be used) for the duration of the COVID-19 declaration under Section 564(b)(1) of the Act, 21 U.S.C. section 360bbb-3(b)(1), unless the authorization is terminated or revoked.  Performed at [facility], [place] [address], [place], [place] [address]     Labs: BNP (last 3 results) Recent Labs    10/02/19 0831 10/04/19 0426 10/05/19 0209  BNP 58.9 66.7 [id number]   Basic Metabolic Panel: Recent Labs  Lab 07/30/20 1829 08/01/20 0245  NA 136 137  K 4.3 3.9  CL 102 103  CO2 23 25  GLUCOSE 259* 151*  BUN 27* 26*  CREATININE 1.77* 1.48*  CALCIUM 9.7 9.8  PHOS  --  3.2   Liver Function Tests: Recent Labs  Lab 07/30/20 1829 08/01/20 0245  AST  25  --   ALT 29  --   ALKPHOS 111  --   BILITOT 0.9  --   PROT 7.5  --   ALBUMIN 4.0 3.4*   Recent Labs  Lab 07/30/20 1829  LIPASE 39   No results for input(s): AMMONIA in the last 168 hours. CBC: Recent Labs  Lab 07/30/20 1829 08/01/20 0245  WBC 7.9 7.9  NEUTROABS 5.6  --   HGB 18.4* 17.9*  HCT 55.8* 53.7*  MCV 86.2 86.1  PLT 202 187   Cardiac Enzymes: No results for input(s): CKTOTAL, CKMB, CKMBINDEX, TROPONINI in the last 168 hours. BNP: Invalid input(s): POCBNP CBG: Recent Labs  Lab 07/31/20 2302  08/01/20 0841 08/01/20 1653 08/01/20 2045 08/02/20 0834  GLUCAP 152* 147* 134* 191* 291*   D-Dimer No results for input(s): DDIMER in the last 72 hours. Hgb A1c No results for input(s): HGBA1C in the last 72 hours. Lipid Profile No results for input(s): CHOL, HDL, LDLCALC, TRIG, CHOLHDL, LDLDIRECT in the last 72 hours. Thyroid function studies Recent Labs    08/01/20 0749  TSH 0.688   Anemia work up Recent Labs    08/01/20 0749  VITAMINB12 405   Urinalysis    Component Value Date/Time   COLORURINE YELLOW 07/30/2020 [place] 07/30/2020 2319   LABSPEC 1.012 07/30/2020 2319   PHURINE 5.0 07/30/2020 2319   GLUCOSEU >=500 (A) 07/30/2020 2319   HGBUR NEGATIVE 07/30/2020 2319   BILIRUBINUR NEGATIVE 07/30/2020 2319   BILIRUBINUR negative 08/26/2019 1516   KETONESUR NEGATIVE 07/30/2020 2319   PROTEINUR 100 (A) 07/30/2020 2319   UROBILINOGEN 0.2 08/26/2019 1516   UROBILINOGEN 0.2 04/18/2008 1153   NITRITE NEGATIVE 07/30/2020 2319   LEUKOCYTESUR NEGATIVE 07/30/2020 2319   Sepsis Labs Invalid input(s): PROCALCITONIN,  WBC,  LACTICIDVEN Microbiology Recent Results (from the past 240 hour(s))  Resp Panel by RT-PCR (Flu A&B, Covid) Nasopharyngeal Swab     Status: None   Collection Time: 07/30/20  7:26 PM   Specimen: Nasopharyngeal Swab; Nasopharyngeal(NP) swabs in vial transport medium  Result Value Ref Range Status   SARS Coronavirus 2 by RT PCR NEGATIVE NEGATIVE Final    Comment: (NOTE) SARS-CoV-2 target nucleic acids are NOT DETECTED.  The SARS-CoV-2 RNA is generally detectable in upper respiratory specimens during the acute phase of infection. The lowest concentration of SARS-CoV-2 viral copies this assay can detect is 138 copies/mL. A negative result does not preclude SARS-Cov-2 infection and should not be used as the sole basis for treatment or other patient management decisions. A negative result may occur with  improper specimen  collection/handling, submission of specimen other than nasopharyngeal swab, presence of viral mutation(s) within the areas targeted by this assay, and inadequate number of viral copies(<138 copies/mL). A negative result must be combined with clinical observations, patient history, and epidemiological information. The expected result is Negative.  Fact Sheet for Patients:  EntrepreneurPulse.com.au  Fact Sheet for Healthcare Providers:  IncredibleEmployment.be  This test is no t yet approved or cleared by the [place] FDA and  has been authorized for detection and/or diagnosis of SARS-CoV-2 by FDA under an Emergency Use Authorization (EUA). This EUA will remain  in effect (meaning this test can be used) for the duration of the COVID-19 declaration under Section 564(b)(1) of the Act, 21 U.S.C.section 360bbb-3(b)(1), unless the authorization is terminated  or revoked sooner.       Influenza A by PCR NEGATIVE NEGATIVE Final   Influenza B by PCR NEGATIVE NEGATIVE  Final    Comment: (NOTE) The Xpert Xpress SARS-CoV-2/FLU/RSV plus assay is intended as an aid in the diagnosis of influenza from Nasopharyngeal swab specimens and should not be used as a sole basis for treatment. Nasal washings and aspirates are unacceptable for Xpert Xpress SARS-CoV-2/FLU/RSV testing.  Fact Sheet for Patients: EntrepreneurPulse.com.au  Fact Sheet for Healthcare Providers: IncredibleEmployment.be  This test is not yet approved or cleared by the [place] FDA and has been authorized for detection and/or diagnosis of SARS-CoV-2 by FDA under an Emergency Use Authorization (EUA). This EUA will remain in effect (meaning this test can be used) for the duration of the COVID-19 declaration under Section 564(b)(1) of the Act, 21 U.S.C. section 360bbb-3(b)(1), unless the authorization is terminated or revoked.  Performed at [facility], [place] [address], [place], [place] [address]    Time coordinating discharge: 35 minutes  SIGNED:  Kerney Elbe, DO [facility] 08/02/2020, 12:16 PM Pager is on [place]  If 7PM-7AM, please contact night-coverage [email or web address]

## 2020-08-02 NOTE — Progress Notes (Signed)
Physical Therapy Treatment Patient Details Name: [name] MRN: [id number] DOB: 12/07/1955 Today's Date: 08/02/2020    History of Present Illness Pt is a 65 y/o male admitted 5/15 secondary to dizziness and gait instability. MRI negative but showed microvascular changes. PMH includes HTN, prostate cancer, CKD, DM, gout, and macular degeneration.    PT Comments    Patient received naked sitting EOB using urinal; impulsively stood up and would have walked through urine dribbled on the floor if PT had not stopped him. Needed constant VC to prevent him from impulsively moving before PT was ready due to restlessness and impulsivity. Doing much better physically, but very confused and with almost no safety awareness. Required Max VC to find his room even when told room number and what side of the hall it was on. Reports that his dizziness is pretty much gone and refused vestibular workup. Left sitting at EOB with all needs met, bed alarm active. Continue to recommend SNF and 24/7A.     Follow Up Recommendations  SNF;Supervision/Assistance - 24 hour     Equipment Recommendations  Rolling walker with 5" wheels    Recommendations for Other Services       Precautions / Restrictions Precautions Precautions: Fall Precaution Comments: Has had 2 falls since presenting to the ED, very confused and impulsive Restrictions Weight Bearing Restrictions: No    Mobility  Bed Mobility               General bed mobility comments: sitting at EOB upon entry    Transfers Overall transfer level: Needs assistance Equipment used: None Transfers: Sit to/from Stand Sit to Stand: Min guard         General transfer comment: min guard for safety, no physical assist given; repeatedly got up and down multiple times during session and needed cues to sit back down for safety as PT was not ready  Ambulation/Gait Ambulation/Gait assistance: Min guard Gait Distance (Feet): 100 Feet Assistive device:  None Gait Pattern/deviations: Step-through pattern;Wide base of support;Drifts right/left;Trunk flexed Gait velocity: Decreased   General Gait Details: slow and mildly unsteady without AD, required min guard for safety as well as max verbal cues for navigating in environment. Unable to locate room even when given room # and told it was on the left. No dizziness.   Stairs             Wheelchair Mobility    Modified Rankin (Stroke Patients Only)       Balance Overall balance assessment: Needs assistance Sitting-balance support: No upper extremity supported;Feet supported Sitting balance-Leahy Scale: Good     Standing balance support: No upper extremity supported;During functional activity Standing balance-Leahy Scale: Fair Standing balance comment: mild unsteadiness but able to maintain balance with min guard                            Cognition Arousal/Alertness: Awake/alert Behavior During Therapy: Flat affect;Impulsive;Restless Overall Cognitive Status: No family/caregiver present to determine baseline cognitive functioning Area of Impairment: Attention;Following commands;Awareness;Problem solving                   Current Attention Level: Sustained   Following Commands: Follows one step commands with increased time   Awareness: Intellectual Problem Solving: Slow processing;Requires verbal cues General Comments: impulsive and with poor safety awareness- was using urinal when I entred, finsihed with urinal and continued to dribble urine onto the floor, would have walked through it had  I not stopped him.      Exercises      General Comments        Pertinent Vitals/Pain Pain Assessment: Faces Faces Pain Scale: No hurt Pain Intervention(s): Limited activity within patient's tolerance;Monitored during session    Home Living                      Prior Function            PT Goals (current goals can now be found in the care plan  section) Acute Rehab PT Goals Patient Stated Goal: none stated PT Goal Formulation: With patient Time For Goal Achievement: 08/14/20 Potential to Achieve Goals: Fair Progress towards PT goals: Progressing toward goals    Frequency    Min 3X/week      PT Plan Equipment recommendations need to be updated    Co-evaluation              AM-PAC PT "6 Clicks" Mobility   Outcome Measure  Help needed turning from your back to your side while in a flat bed without using bedrails?: None Help needed moving from lying on your back to sitting on the side of a flat bed without using bedrails?: None Help needed moving to and from a bed to a chair (including a wheelchair)?: A Little Help needed standing up from a chair using your arms (e.g., wheelchair or bedside chair)?: None Help needed to walk in hospital room?: A Little Help needed climbing 3-5 steps with a railing? : A Lot 6 Click Score: 20    End of Session Equipment Utilized During Treatment: Gait belt Activity Tolerance: Patient tolerated treatment well Patient left: in bed;with call bell/phone within reach;with bed alarm set (sitting at EOB) Nurse Communication: Mobility status PT Visit Diagnosis: Unsteadiness on feet (R26.81);Repeated falls (R29.6);History of falling (Z91.81)     Time: 0932-3557 PT Time Calculation (min) (ACUTE ONLY): 12 min  Charges:  $Gait Training: 8-22 mins                     Windell Norfolk, DPT, PN1   Supplemental Physical Therapist [place]    Pager [phone] Acute Rehab Office [phone]

## 2020-08-02 NOTE — TOC Progression Note (Addendum)
Transition of Care [facility]) - Progression Note    Patient Details  Name: [name] MRN: [id number] Date of Birth: [birth year]  Transition of Care [facility]) CM/SW [place], LCSW Phone Number: 08/02/2020, 10:14 AM  Clinical Narrative:    10am-CSW received call from [facility] stating they have had a COVID outbreak and are unable to accept unvaccinated patients at this time. CSW made patient aware; he would like to speak with his "lady friend" to choose a different facility.   11am-CSW met with patient again. He has selected [facility]. CSW contacted [employer] to change facility choice but they state a new auth has to be submitted. CSW faxed clinicals, new ref# [id number]. Rapid covid test ordered.   1pm-CSW received insurance approval for [facility]: #[id number], effective 08/02/20-08/04/20. Completing paperwork with patient. Will call transport once covid test returns negative.    Expected Discharge Plan: Skilled Nursing Facility Barriers to Discharge: Insurance Authorization  Expected Discharge Plan and Services Expected Discharge Plan: [place] In-house Referral: Clinical Social Work Discharge Planning Services: CM Consult Post Acute Care Choice: [place] Living arrangements for the past 2 months: Apartment                                       Social Determinants of Health (SDOH) Interventions    Readmission Risk Interventions Readmission Risk Prevention Plan 10/05/2019  Transportation Screening Complete  PCP or Specialist Appt within 5-7 Days Complete  Home Care Screening Complete  Medication Review (RN CM) Complete  Some recent data might be hidden

## 2020-08-02 NOTE — Progress Notes (Signed)
Occupational Therapy Treatment Patient Details Name: [name] MRN: [id number] DOB: 1956-01-15 Today's Date: 08/02/2020    History of present illness Pt is a 65 y/o male admitted 5/15 secondary to dizziness and gait instability. MRI negative but showed microvascular changes. PMH includes HTN, prostate cancer, CKD, DM, gout, and macular degeneration.   OT comments  Pt received standing from EOB with no gown, attempting to wash peri area with IV out on floor- alerted RN and assisted pt with safe completion of ADLS. Pt presents with decreased safety awareness, impaired cognition and generalized deconditioning. Pt currently requires supervision for LB ADLS, MIN A for UB ADLS and min guard for ADL transfers with RW. Pt perseverating on needing to wait on a call from his "lady friend." Pt would continue to benefit from skilled occupational therapy while admitted and after d/c to address the below listed limitations in order to improve overall functional mobility and facilitate independence with BADL participation. DC plan remains appropriate, will follow acutely per POC.     Follow Up Recommendations  SNF;Supervision/Assistance - 24 hour    Equipment Recommendations  3 in 1 bedside commode    Recommendations for Other Services      Precautions / Restrictions Precautions Precautions: Fall Precaution Comments: Has had 2 falls since presenting to the ED, very confused and impulsive Restrictions Weight Bearing Restrictions: No       Mobility Bed Mobility               General bed mobility comments: standing in room upon arrival, left pt seated EOB with bed alarm activated at end of session    Transfers Overall transfer level: Needs assistance Equipment used: None Transfers: Sit to/from Stand Sit to Stand: Min guard         General transfer comment: min guard for safety, no physical assist given, min guard for safety    Balance Overall balance assessment: Needs  assistance Sitting-balance support: No upper extremity supported;Feet supported Sitting balance-Leahy Scale: Good     Standing balance support: No upper extremity supported;During functional activity Standing balance-Leahy Scale: Fair Standing balance comment: pt standing for ADLS with no UE support and mild unsteadiness                           ADL either performed or assessed with clinical judgement   ADL Overall ADL's : Needs assistance/impaired             Lower Body Bathing: Supervison/ safety;Set up;Sit to/from stand Lower Body Bathing Details (indicate cue type and reason): simulated via anterior pericare Upper Body Dressing : Minimal assistance;Sitting Upper Body Dressing Details (indicate cue type and reason): to don new gown     Toilet Transfer: Min guard;Ambulation;Regular Glass blower/designer Details (indicate cue type and reason): standing to urinate Toileting- Clothing Manipulation and Hygiene: Supervision/safety;Set up;Sit to/from stand       Functional mobility during ADLs: Min guard;Rolling walker General ADL Comments: pt continues to present with decreased safety awarenesss, cognitive deficits, and decreased activity tolerance     Vision       Perception     Praxis      Cognition Arousal/Alertness: Awake/alert Behavior During Therapy: Flat affect;Impulsive;Restless Overall Cognitive Status: No family/caregiver present to determine baseline cognitive functioning Area of Impairment: Attention;Following commands;Awareness;Problem solving;Memory                   Current Attention Level: Sustained Memory: Decreased short-term memory (  asking whether therapist is from PT/OT even though OTA had already introduced herself) Following Commands: Follows one step commands with increased time   Awareness: Intellectual Problem Solving: Slow processing;Requires verbal cues;Difficulty sequencing General Comments: pt standing up in room  with no gown attempting to wash periarea with IV dislodged with no awareness to safety implications        Exercises     Shoulder Instructions       General Comments pt on RA with SpO2 97% HR 100 bpm    Pertinent Vitals/ Pain       Pain Assessment: No/denies pain Faces Pain Scale: No hurt Pain Intervention(s): Limited activity within patient's tolerance;Monitored during session  Home Living                                          Prior Functioning/Environment              Frequency  Min 2X/week        Progress Toward Goals  OT Goals(current goals can now be found in the care plan section)  Progress towards OT goals: Progressing toward goals  Acute Rehab OT Goals Patient Stated Goal: to talk to his lady friend OT Goal Formulation: With patient Time For Goal Achievement: 08/14/20 Potential to Achieve Goals: Good  Plan Discharge plan remains appropriate;Frequency remains appropriate    Co-evaluation                 AM-PAC OT "6 Clicks" Daily Activity     Outcome Measure   Help from another person eating meals?: None Help from another person taking care of personal grooming?: A Little Help from another person toileting, which includes using toliet, bedpan, or urinal?: A Little Help from another person bathing (including washing, rinsing, drying)?: A Lot Help from another person to put on and taking off regular upper body clothing?: A Little Help from another person to put on and taking off regular lower body clothing?: A Little 6 Click Score: 18    End of Session Equipment Utilized During Treatment: Rolling walker;Gait belt  OT Visit Diagnosis: Unsteadiness on feet (R26.81);Other abnormalities of gait and mobility (R26.89);Muscle weakness (generalized) (M62.81);Other symptoms and signs involving cognitive function   Activity Tolerance Patient tolerated treatment well   Patient Left in bed;with call bell/phone within reach;with  bed alarm set;Other (comment) (sitting EOB)   Nurse Communication Mobility status;Other (comment) (IV came out upon arrival)        Time: 1129-1149 OT Time Calculation (min): 20 min  Charges: OT General Charges $OT Visit: 1 Visit OT Treatments $Self Care/Home Management : 8-22 mins  Randy Alto., COTA/L Acute Rehabilitation Services [phone]    [name] 08/02/2020, 3:08 PM

## 2020-08-02 NOTE — Plan of Care (Signed)
  Problem: Education: Goal: Knowledge of General Education information will improve Description: Including pain rating scale, medication(s)/side effects and non-pharmacologic comfort measures Outcome: Progressing   Problem: Clinical Measurements: Goal: Ability to maintain clinical measurements within normal limits will improve Outcome: Progressing Goal: Will remain free from infection Outcome: Progressing Goal: Diagnostic test results will improve Outcome: Progressing Goal: Respiratory complications will improve Outcome: Progressing Goal: Cardiovascular complication will be avoided Outcome: Progressing   Problem: Activity: Goal: Risk for activity intolerance will decrease Outcome: Progressing   Problem: Elimination: Goal: Will not experience complications related to bowel motility Outcome: Progressing Goal: Will not experience complications related to urinary retention Outcome: Progressing   Problem: Pain Managment: Goal: General experience of comfort will improve Outcome: Progressing   Problem: Safety: Goal: Ability to remain free from injury will improve Outcome: Progressing

## 2020-08-02 NOTE — TOC Transition Note (Signed)
Transition of Care [facility]) - CM/SW Discharge Note   Patient Details  Name: [name] MRN: [id number] Date of Birth: Jul 16, 1955  Transition of [facility]) CM/SW Contact:  Benard Halsted, LCSW Phone Number: 08/02/2020, 3:47 PM   Clinical Narrative:    Patient will DC to: [facility] Anticipated DC date: 08/02/20 Family notified: Patient declined and made his friend aware Transport by: Corey Harold   Per MD patient ready for DC to [facility]. RN to call report prior to discharge [phone]). RN, patient, patient's family, and facility notified of DC. Discharge Summary and FL2 sent to facility along with admission paperwork. DC packet on chart. Ambulance transport requested for patient.   CSW will sign off for now as social work intervention is no longer needed. Please consult [place] again if new needs arise.      Final next level of care: Skilled Nursing Facility Barriers to Discharge: Barriers Resolved   Patient Goals and CMS Choice Patient states their goals for this hospitalization and ongoing recovery are:: Rehab CMS Medicare.gov Compare Post Acute Care list provided to:: Patient Choice offered to / list presented to : Patient  Discharge Placement   Existing PASRR number confirmed : 08/02/20          Patient chooses bed at: [facility] Patient to be transferred to facility by: [place] Name of family member notified: Declined Patient and family notified of of transfer: 08/02/20  Discharge Plan and Services In-house Referral: Clinical Social Work Discharge Planning Services: CM Consult Post Acute Care Choice: [place]                               Social Determinants of Health (SDOH) Interventions     Readmission Risk Interventions Readmission Risk Prevention Plan 10/05/2019  Transportation Screening Complete  PCP or Specialist Appt within 5-7 Days Complete  Home Care Screening Complete  Medication Review (RN CM) Complete  Some  recent data might be hidden

## 2020-08-03 MED ORDER — TRAMADOL HCL 50 MG PO TABS: 50.0000 mg | ORAL_TABLET | Freq: Four times a day (QID) | ORAL | 0 refills | Status: DC | PRN

## 2020-08-03 MED ORDER — METOPROLOL TARTRATE 50 MG PO TABS: 50.0000 mg | ORAL_TABLET | Freq: Two times a day (BID) | ORAL | Status: DC

## 2020-08-03 NOTE — Discharge Summary (Addendum)
Physician Discharge Summary  [name] [id number] DOB: 07-17-1955 DOA: 07/30/2020  PCP: [name], [name]  Admit date: 07/30/2020 Discharge date: 08/03/2020  Admitted From: Home Disposition: SNF  Recommendations for Outpatient Follow-up:  1. Follow up with PCP in 1-2 weeks 2. Follow up with Neurology in 6-8 weeks as an outpatient  3. Please obtain CMP/CBC, Mag, Phos in one week 4. Please follow up on the following pending results:  Home Health: No Equipment/Devices: None  Discharge Condition: Stable CODE STATUS: FULL CODE  Diet recommendation: Heart Healthy Carb Modified Diet   Brief/Interim Summary: Patient is a 65 year old overweight male with a past medical history significant for but not limited to non-insulin-dependent diabetes mellitus type 2, hypertension, hyperlipidemia, history of prostate cancer, history of prior nephrolithiasis and chronic right hydronephrosis followed by urology, chronic kidney disease stage IIIa as well as other comorbidities who presented to the ED via EMS with a chief complaint of chest pain, dizziness and unsteady gait.  He is given 324 mg by EMS and in the ED he was very unsteady when ambulating and fell trying to use the urinal and landed on his left hip.  Initial work-up showed a WBC of 7.9 hemoglobin 18.4 albumin/creatinine of 27/1.17.  EKG was without any ischemic changes and a COVID and influenza testing was negative.  He had an MRI of his brain which was negative for acute stroke but did show irregular flow void within the proximal right V4 segment which could be related to slow flow or occlusion.  The major intracranial vascular flow voids otherwise that were well preserved.  Neurology was consulted given his dizziness and unsteady gait and they recommended further imaging with MRA of the head with contrast and MRA of the neck which showed an atheromatous stenosis of 40% at the origin of the left ICA otherwise widely patent both carotid artery  systems within the neck and a short segment moderate to severe stenosis of the right vertebral artery as it crosses the dual section to the cranial vault and corroborates with signal abnormality seen on prior noncontrast MRI.  Patient had both wide patency of both vertebral arteries within the neck and a dominant left vertebral artery.  Patient reportedly had 3 weeks of dizziness and lightheadedness and unsteady gait and states he felt "drunk".  Further work-up was done by neurology and PT OT recommending SNF.  Patient had an MRI of the C-spine which showed multilevel DJD and other degenerative changes but no evidence of myelopathy.  Neurology felt that patient's worsening gait difficulty was likely secondary to sensory neuropathy given his diabetes with complications and recommended continuing B12 replenishment and keeping levels always above 400 and continue aspirin and statin.  They also recommended strict glucose control with a hemoglobin A1c less than 7.  He is deemed medically stable after her neurology cleared him and will need SNF for further PT OT and outpatient follow-up with neurology in 6 to 8 weeks.  **ADDENDEM 08/03/20 Patient was discharged yesterday and was medically stable be discharged however when transport came to pick him up his blood pressure was checked and was over 200/100.  RN rechecked manually his blood pressure and discharge was held by covering Physician.  His olmesartan and hydrochlorothiazide was resumed yesterday afternoon.   On-call physician was made aware by the nursing staff and the facility nurse was concerned that he was only taking daily metoprolol despite it clearly showing that he was resumed on his Home Regimen.  I advised the Nurse  this morning that he is taking olmesartan and hydrochlorothiazide and this was resumed and on his Discharge MAR.  Blood pressure is much improved today and was last check to be 145/83 and he is medically stable to be discharged at this  time.  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 which is uncontrolled -He had further work-up as above and neurology was consulted  -TSH was 0.688 and U/A Negative  -B12 level was 405 and neurology recommending keeping the B12 levels greater than 400 OS -Ethanol level is negative -UDS negative -PT OT recommending SNF and awaiting placement  -Patient will need outpatient follow-up with neurology in 6 to 8 weeks  Atypical chest pain rule out ACS -He had cardiac enzymes are negative no EKG changes -Currently is chest pain-free and recommendation is to continue aspirin and statin  -Outpatient follow-up with cardiology if warranted  Uncontrolled noninsulin-dependent diabetes mellitus type 2 associated with hyperglycemia and neuropathy -Hemoglobin A1c was 8.1 -C/w Sensitive Novolog SSI AC/HS whiile hospitalzied -Holding Semaglutide 14 mg po Daily  -CBG's ranging from 149-291  Hypertension -We will resume antihypertensives now as he takes Metoprolol Tartrate 50 mg po BID and Olmesartan-HCTZ 40-25 mg po Daily; BP Was elevated yesterday prior to D/C and so D/C was held   -C/w Labetolol 20 mg IV q3hprn for SBP>160 or DBP>100 -Continue to Monitor BP per Protocol -Last BP was improved at 145/83  Hyperlipidemia -Continue with Atorvastatin 20 g p.o. daily  AKI on CKD stage IIIa -Was noted to be mildly dehydrated on admission -Started IV fluid hydration with LR at 75 mL/hr and his BUN/creatinine improved and went from 27/1.77 -> 26/1.48 -Check orthostatics prior to D/C  -We will continue IV fluid hydration until Discharge -Resumed Olmesartan-HCTZ -Avoid further nephrotoxic medications, contrast dyes, hypotension and renally adjust medication -Repeat CMP  within 1 week   Left Hip Pain -X-rays were negative for injury or fracture as he fell on admission    Discharge Instructions  Discharge Instructions    Call MD for:  difficulty breathing, headache or visual disturbances   Complete by: As directed    Call MD for:  extreme fatigue   Complete by: As directed    Call MD for:  hives   Complete by: As directed    Call MD for:  persistant dizziness or light-headedness   Complete by: As directed    Call MD for:  persistant nausea and vomiting   Complete by: As directed    Call MD for:  redness, tenderness, or signs of infection (pain, swelling, redness, odor or green/yellow discharge around incision site)   Complete by: As directed    Call MD for:  severe uncontrolled pain   Complete by: As directed    Call MD for:  temperature >100.4   Complete by: As directed    Diet - low sodium heart healthy   Complete by: As directed    Diet Carb Modified   Complete by: As directed    Discharge instructions   Complete by: As directed    You were cared for by a hospitalist during your hospital stay. If you have any questions about your discharge medications or the care you received while you were in the hospital after you  are discharged, you can call the unit and ask to speak with the hospitalist on call if the hospitalist that took care of you is not available. Once you are discharged, your primary care physician will handle any further medical issues. Please note that NO REFILLS for any discharge medications will be authorized once you are discharged, as it is imperative that you return to your primary care physician (or establish a relationship with a primary care physician if you do not have one) for your aftercare needs so that they can reassess your need for medications and monitor your lab values.  Follow up with PCP and Neurology within 1-2 weeks. Take all medications as prescribed. If symptoms change or worsen please return to the ED for  evaluation   Increase activity slowly   Complete by: As directed      Allergies as of 08/03/2020   No Known Allergies     Medication List    STOP taking these medications   ibuprofen 200 MG tablet Commonly known as: ADVIL     TAKE these medications   acetaminophen 325 MG tablet Commonly known as: TYLENOL Take 2 tablets (650 mg total) by mouth every 6 (six) hours as needed for mild pain (or Fever >/= 101).   aspirin EC 81 MG tablet Take 81 mg by mouth daily with breakfast.   atorvastatin 20 MG tablet Commonly known as: LIPITOR Take 1 tablet (20 mg total) by mouth daily.   clotrimazole-betamethasone cream Commonly known as: LOTRISONE Apply 1 application topically 2 (two) times daily. What changed:   when to take this  reasons to take this   diphenhydrAMINE 25 MG tablet Commonly known as: BENADRYL Take 25 mg by mouth every 6 (six) hours as needed for allergies.   metoprolol tartrate 50 MG tablet Commonly known as: LOPRESSOR Take 1 tablet (50 mg total) by mouth 2 (two) times daily.   Myrbetriq 25 MG Tb24 tablet Generic drug: mirabegron ER TAKE 1 TABLET BY MOUTH EVERY DAY What changed: how much to take   nystatin powder Commonly known as: nystatin Apply 1 application topically 3 (three) times daily. What changed:   when to take this  reasons to take this   olmesartan-hydrochlorothiazide 40-25 MG tablet Commonly known as: BENICAR HCT Take 1 tablet by mouth daily.   Rybelsus 14 MG Tabs Generic drug: Semaglutide Take 1 tablet by mouth daily. 30 minutes before breakfast What changed: how much to take   sildenafil 20 MG tablet Commonly known as: REVATIO TAKE 1 TABLET BY MOUTH EVERY DAY (PRIOR AUTHORIZATION DENIED) What changed:   how much to take  how to take this  when to take this  reasons to take this  additional instructions   traMADol 50 MG tablet Commonly known as: ULTRAM Take 1 tablet (50 mg total) by mouth every 6 (six) hours as needed  for severe pain.   traZODone 50 MG tablet Commonly known as: DESYREL TAKE 1 TABLET (50 MG TOTAL) BY MOUTH AT BEDTIME AS NEEDED FOR SLEEP.   VISINE OP Place 1 drop into both eyes daily as needed (itching/irritation).       Contact information for after-discharge care    Destination    [facility] .   Service: Skilled Nursing Contact information: [place] [phone]                 No Known Allergies  Consultations:  Neurology   Procedures/Studies: DG Chest 2  View  Result Date: 07/30/2020 CLINICAL DATA:  Chest pain shortness of breath EXAM: CHEST - 2 VIEW COMPARISON:  February 10, 2020 FINDINGS: The heart size and mediastinal contours are within normal limits. Low lung volumes with bibasilar atelectasis. Stable blunting of the cardiophrenic angle at the left lung base. No visible pleural effusion or pneumothorax. The visualized skeletal structures are unremarkable. IMPRESSION: No active cardiopulmonary disease. Electronically Signed   By: Dahlia Bailiff MD   On: 07/30/2020 21:26   MR ANGIO HEAD WO CONTRAST  Result Date: 07/31/2020 CLINICAL DATA:  Initial evaluation for neuro deficit, stroke suspected, dizziness. EXAM: MRI HEAD WITH CONTRAST MRA HEAD WITHOUT CONTRAST MRA NECK WITHOUT AND WITH CONTRAST TECHNIQUE: Multiplanar, multi-echo pulse sequences of the brain and surrounding structures were acquired without intravenous contrast. Angiographic images of the Circle of Willis were acquired using MRA technique without intravenous contrast. Angiographic images of the neck were acquired using MRA technique without and with intravenous contrast. Carotid stenosis measurements (when applicable) are obtained utilizing NASCET criteria, using the distal internal carotid diameter as the denominator. CONTRAST:  80mL GADAVIST GADOBUTROL 1 MMOL/ML IV SOLN COMPARISON:  Prior noncontrast MRI from earlier the same  day. FINDINGS: MRI HEAD FINDINGS Postcontrast imaging of the brain demonstrates no abnormal or pathologic enhancement. No appreciable mass lesion. Normal intravascular enhancement seen throughout the major intracranial vascular structures. No new finding. MRA HEAD FINDINGS ANTERIOR CIRCULATION: Visualized distal cervical segments of the internal carotid arteries widely patent with antegrade flow. Petrous, cavernous, and supraclinoid segments patent without stenosis or other abnormality. A1 segments patent bilaterally. Normal anterior communicating artery complex. Anterior cerebral arteries patent to their distal aspects without stenosis. No M1 stenosis or occlusion. Normal MCA bifurcations. Distal MCA branches well perfused and symmetric. POSTERIOR CIRCULATION: V4 segments patent without stenosis or other abnormality. Left PICA origin patent and normal. Right PICA not seen. Basilar widely patent to its distal aspect without stenosis. Superior cerebellar arteries patent bilaterally. Both PCAs primarily supplied via the basilar well perfused to their distal aspects. No intracranial aneurysm. MRA NECK FINDINGS AORTIC ARCH: Visualized aortic arch normal caliber with normal 3 vessel morphology. No hemodynamically significant stenosis seen about the origin of the great vessels. RIGHT CAROTID SYSTEM: Right common carotid artery patent from its origin to the bifurcation without stenosis. No significant atheromatous narrowing about the right bifurcation. Right ICA widely patent distally without stenosis, evidence for dissection or occlusion. LEFT CAROTID SYSTEM: Left CCA patent from its origin to the bifurcation without stenosis. 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 Left ICA patent distally without stenosis, evidence for dissection or occlusion. VERTEBRAL ARTERIES: Both vertebral arteries arise from the subclavian arteries. No proximal subclavian artery stenosis.  Left vertebral artery slightly dominant. Vertebral arteries widely patent within the neck without stenosis, evidence for dissection or occlusion. There is a short-segment moderate to severe stenosis involving the right vertebral artery as it crosses the dural reflection into the cranial vault (series 1078, image 9). This accounts for the signal abnormality seen on prior noncontrast MRI. IMPRESSION: MRI HEAD: Negative postcontrast MRI of the head. No abnormal enhancement or other new finding. MRA HEAD: Negative intracranial MRA with no large vessel occlusion or hemodynamically significant stenosis. MRA NECK: 1. 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 Otherwise wide patency of both carotid artery systems within the neck. 2. Short-segment moderate to severe stenosis involving the right vertebral artery as it crosses  the dural reflection into the cranial vault. This accounts for the signal abnormality seen on prior noncontrast MRI. 3. Otherwise wide patency of both vertebral arteries within the neck. The left vertebral artery is dominant. Electronically Signed   By: Jeannine Boga M.D.   On: 07/31/2020 04:32   MR ANGIO NECK W WO CONTRAST  Result Date: 07/31/2020 CLINICAL DATA:  Initial evaluation for neuro deficit, stroke suspected, dizziness. EXAM: MRI HEAD WITH CONTRAST MRA HEAD WITHOUT CONTRAST MRA NECK WITHOUT AND WITH CONTRAST TECHNIQUE: Multiplanar, multi-echo pulse sequences of the brain and surrounding structures were acquired without intravenous contrast. Angiographic images of the Circle of Willis were acquired using MRA technique without intravenous contrast. Angiographic images of the neck were acquired using MRA technique without and with intravenous contrast. Carotid stenosis measurements (when applicable) are obtained utilizing NASCET criteria, using the distal internal carotid diameter as the denominator. CONTRAST:  8mL GADAVIST GADOBUTROL 1 MMOL/ML IV  SOLN COMPARISON:  Prior noncontrast MRI from earlier the same day. FINDINGS: MRI HEAD FINDINGS Postcontrast imaging of the brain demonstrates no abnormal or pathologic enhancement. No appreciable mass lesion. Normal intravascular enhancement seen throughout the major intracranial vascular structures. No new finding. MRA HEAD FINDINGS ANTERIOR CIRCULATION: Visualized distal cervical segments of the internal carotid arteries widely patent with antegrade flow. Petrous, cavernous, and supraclinoid segments patent without stenosis or other abnormality. A1 segments patent bilaterally. Normal anterior communicating artery complex. Anterior cerebral arteries patent to their distal aspects without stenosis. No M1 stenosis or occlusion. Normal MCA bifurcations. Distal MCA branches well perfused and symmetric. POSTERIOR CIRCULATION: V4 segments patent without stenosis or other abnormality. Left PICA origin patent and normal. Right PICA not seen. Basilar widely patent to its distal aspect without stenosis. Superior cerebellar arteries patent bilaterally. Both PCAs primarily supplied via the basilar well perfused to their distal aspects. No intracranial aneurysm. MRA NECK FINDINGS AORTIC ARCH: Visualized aortic arch normal caliber with normal 3 vessel morphology. No hemodynamically significant stenosis seen about the origin of the great vessels. RIGHT CAROTID SYSTEM: Right common carotid artery patent from its origin to the bifurcation without stenosis. No significant atheromatous narrowing about the right bifurcation. Right ICA widely patent distally without stenosis, evidence for dissection or occlusion. LEFT CAROTID SYSTEM: Left CCA patent from its origin to the bifurcation without stenosis. 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 Left ICA patent distally without stenosis, evidence for dissection or occlusion. VERTEBRAL ARTERIES: Both vertebral arteries arise from the  subclavian arteries. No proximal subclavian artery stenosis. Left vertebral artery slightly dominant. Vertebral arteries widely patent within the neck without stenosis, evidence for dissection or occlusion. There is a short-segment moderate to severe stenosis involving the right vertebral artery as it crosses the dural reflection into the cranial vault (series 1078, image 9). This accounts for the signal abnormality seen on prior noncontrast MRI. IMPRESSION: MRI HEAD: Negative postcontrast MRI of the head. No abnormal enhancement or other new finding. MRA HEAD: Negative intracranial MRA with no large vessel occlusion or hemodynamically significant stenosis. MRA NECK: 1. 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 Otherwise wide patency of both carotid artery systems within the neck. 2. Short-segment moderate to severe stenosis involving the right vertebral artery as it crosses the dural reflection into the cranial vault. This accounts for the signal abnormality seen on prior noncontrast MRI. 3. Otherwise wide patency of both vertebral arteries within the neck. The left vertebral  artery is dominant. Electronically Signed   By: Jeannine Boga M.D.   On: 07/31/2020 04:32   MR BRAIN WO CONTRAST  Result Date: 07/31/2020 CLINICAL DATA:  Initial evaluation for acute dizziness. EXAM: MRI HEAD WITHOUT CONTRAST TECHNIQUE: Multiplanar, multiecho pulse sequences of the brain and surrounding structures were obtained without intravenous contrast. COMPARISON:  Prior CT from 09/28/2019 FINDINGS: Brain: Generalized age-related cerebral atrophy. Patchy and confluent T2/FLAIR hyperintensity within the periventricular deep white matter both cerebral hemispheres most consistent with chronic small vessel ischemic disease, moderate in nature. No abnormal foci of restricted diffusion to suggest acute or subacute ischemia. Gray-white matter differentiation maintained. No areas of remote cortical  infarction. No evidence for acute or chronic intracranial hemorrhage. No mass lesion, midline shift or mass effect. Mild ventricular prominence related to global parenchymal volume loss of hydrocephalus. No extra-axial fluid collection. Pituitary gland suprasellar region within normal limits. Midline structures intact. Vascular: Irregular flow void within the proximal right V4 segment, which could be related to slow flow and/or occlusion (series 10, image 1). Well preserved flow void seen distally within the right V4 segment. Mild FLAIR signal intensity within the right transverse and sigmoid sinuses without definite T1 correlate, likely related to slow/sluggish flow. No other findings to suggest dural sinus thrombosis. Skull and upper cervical spine: Craniocervical junction within normal limits. Bone marrow signal intensity within normal limits. No scalp soft tissue abnormality. Sinuses/Orbits: Globes and orbital soft tissues within normal limits. Paranasal sinuses are largely clear. No significant mastoid effusion. Inner ear structures grossly normal. Other: None. IMPRESSION: 1. No acute intracranial abnormality. 2. Age-related cerebral atrophy with moderate chronic small vessel ischemic disease. 3. Irregular flow void within the proximal right V4 segment, which could be related to slow flow and/or occlusion. Major intracranial vascular flow voids otherwise well preserved. Electronically Signed   By: Jeannine Boga M.D.   On: 07/31/2020 01:14   MR BRAIN W CONTRAST  Result Date: 07/31/2020 CLINICAL DATA:  Initial evaluation for neuro deficit, stroke suspected, dizziness. EXAM: MRI HEAD WITH CONTRAST MRA HEAD WITHOUT CONTRAST MRA NECK WITHOUT AND WITH CONTRAST TECHNIQUE: Multiplanar, multi-echo pulse sequences of the brain and surrounding structures were acquired without intravenous contrast. Angiographic images of the Circle of Willis were acquired using MRA technique without intravenous contrast.  Angiographic images of the neck were acquired using MRA technique without and with intravenous contrast. Carotid stenosis measurements (when applicable) are obtained utilizing NASCET criteria, using the distal internal carotid diameter as the denominator. CONTRAST:  31mL GADAVIST GADOBUTROL 1 MMOL/ML IV SOLN COMPARISON:  Prior noncontrast MRI from earlier the same day. FINDINGS: MRI HEAD FINDINGS Postcontrast imaging of the brain demonstrates no abnormal or pathologic enhancement. No appreciable mass lesion. Normal intravascular enhancement seen throughout the major intracranial vascular structures. No new finding. MRA HEAD FINDINGS ANTERIOR CIRCULATION: Visualized distal cervical segments of the internal carotid arteries widely patent with antegrade flow. Petrous, cavernous, and supraclinoid segments patent without stenosis or other abnormality. A1 segments patent bilaterally. Normal anterior communicating artery complex. Anterior cerebral arteries patent to their distal aspects without stenosis. No M1 stenosis or occlusion. Normal MCA bifurcations. Distal MCA branches well perfused and symmetric. POSTERIOR CIRCULATION: V4 segments patent without stenosis or other abnormality. Left PICA origin patent and normal. Right PICA not seen. Basilar widely patent to its distal aspect without stenosis. Superior cerebellar arteries patent bilaterally. Both PCAs primarily supplied via the basilar well perfused to their distal aspects. No intracranial aneurysm. MRA NECK FINDINGS AORTIC ARCH: Visualized aortic arch normal  caliber with normal 3 vessel morphology. No hemodynamically significant stenosis seen about the origin of the great vessels. RIGHT CAROTID SYSTEM: Right common carotid artery patent from its origin to the bifurcation without stenosis. No significant atheromatous narrowing about the right bifurcation. Right ICA widely patent distally without stenosis, evidence for dissection or occlusion. LEFT CAROTID SYSTEM:  Left CCA patent from its origin to the bifurcation without stenosis. 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 Left ICA patent distally without stenosis, evidence for dissection or occlusion. VERTEBRAL ARTERIES: Both vertebral arteries arise from the subclavian arteries. No proximal subclavian artery stenosis. Left vertebral artery slightly dominant. Vertebral arteries widely patent within the neck without stenosis, evidence for dissection or occlusion. There is a short-segment moderate to severe stenosis involving the right vertebral artery as it crosses the dural reflection into the cranial vault (series 1078, image 9). This accounts for the signal abnormality seen on prior noncontrast MRI. IMPRESSION: MRI HEAD: Negative postcontrast MRI of the head. No abnormal enhancement or other new finding. MRA HEAD: Negative intracranial MRA with no large vessel occlusion or hemodynamically significant stenosis. MRA NECK: 1. 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 Otherwise wide patency of both carotid artery systems within the neck. 2. Short-segment moderate to severe stenosis involving the right vertebral artery as it crosses the dural reflection into the cranial vault. This accounts for the signal abnormality seen on prior noncontrast MRI. 3. Otherwise wide patency of both vertebral arteries within the neck. The left vertebral artery is dominant. Electronically Signed   By: Jeannine Boga M.D.   On: 07/31/2020 04:32   MR CERVICAL SPINE W WO CONTRAST  Result Date: 08/01/2020 CLINICAL DATA:  Difficulty walking for the past 1-2 weeks. EXAM: MRI CERVICAL SPINE WITHOUT AND WITH CONTRAST TECHNIQUE: Multiplanar and multiecho pulse sequences of the cervical spine, to include the craniocervical junction and cervicothoracic junction, were obtained without and with intravenous contrast. CONTRAST:  7.20mL GADAVIST GADOBUTROL 1  MMOL/ML IV SOLN COMPARISON:  CT cervical spine dated April 18, 2008. FINDINGS: Alignment: Trace anterolisthesis at C4-C5. Vertebrae: No fracture, evidence of discitis, or bone lesion. Cord: Normal signal and morphology.  No intradural enhancement. Posterior Fossa, vertebral arteries, paraspinal tissues: Negative. Disc levels: C2-C3: Negative disc. Mild left facet uncovertebral hypertrophy. No stenosis. C3-C4: Small posterior disc osteophyte complex. Moderate right and mild left facet uncovertebral hypertrophy. Moderate right and mild left neuroforaminal stenosis. C4-C5: Small shallow central disc protrusion. Mild bilateral uncovertebral hypertrophy. Moderate right facet arthropathy with periarticular marrow edema. Mild left facet arthropathy. Mild bilateral neuroforaminal stenosis. C5-C6: Tiny shallow central disc protrusion. Mild bilateral facet arthropathy. No stenosis. C6-C7:  Negative. C7-T1:  Negative. IMPRESSION: 1. Normal cervical spinal cord. 2. Multilevel cervical spondylosis as described above, worst at C3-C4 where there is moderate right neuroforaminal stenosis. 3. Moderate right facet arthropathy at C4-C5 with acute inflammatory changes. Electronically Signed   By: Titus Dubin M.D.   On: 08/01/2020 13:12   DG Hip Unilat W or Wo Pelvis 2-3 Views Left  Result Date: 07/31/2020 CLINICAL DATA:  Left hip pain EXAM: DG HIP (WITH OR WITHOUT PELVIS) 2-3V LEFT COMPARISON:  None. FINDINGS: There is no evidence of hip fracture or dislocation. Osteopenia. No significant degenerative changes. IMPRESSION: Negative for fracture. Electronically Signed   By: Monte Fantasia M.D.   On: 07/31/2020 05:35    Subjective: Seen and examined at bedside and was feeling well  and states he is no longer feeling dizzy or off balance as much.  No chest pain or shortness of breath.  Feels back to his baseline and wanting to get some rehab.  No other concerns or complaints at this time.   Discharge Exam: Vitals:    08/03/20 0825 08/03/20 0825  BP: (!) 159/87 (!) 159/87  Pulse: 67 67  Resp: 18 20  Temp: 98.8 F (37.1 C) 98.8 F (37.1 C)  SpO2: 98% 98%   Vitals:   08/03/20 0100 08/03/20 0347 08/03/20 0825 08/03/20 0825  BP: (!) 112/55 (!) 145/83 (!) 159/87 (!) 159/87  Pulse: (!) 54 62 67 67  Resp: 16 18 18 20   Temp:  98.3 F (36.8 C) 98.8 F (37.1 C) 98.8 F (37.1 C)  TempSrc:  Axillary Oral Oral  SpO2:  100% 98% 98%  Weight:      Height:       General: Pt is alert, awake, not in acute distress Cardiovascular: RRR, S1/S2 +, no rubs, no gallops Respiratory: Diminished slightly bilaterally, no wheezing, no rhonchi; unlabored breathing and not wearing supplemental oxygen via nasal cannula Abdominal: Soft, NT, distended secondary to body habitus, bowel sounds + Extremities: Very minimal edema, no cyanosis  The results of significant diagnostics from this hospitalization (including imaging, microbiology, ancillary and laboratory) are listed below for reference.    Microbiology: Recent Results (from the past 240 hour(s))  Resp Panel by RT-PCR (Flu A&B, Covid) Nasopharyngeal Swab     Status: None   Collection Time: 07/30/20  7:26 PM   Specimen: Nasopharyngeal Swab; Nasopharyngeal(NP) swabs in vial transport medium  Result Value Ref Range Status   SARS Coronavirus 2 by RT PCR NEGATIVE NEGATIVE Final    Comment: (NOTE) SARS-CoV-2 target nucleic acids are NOT DETECTED.  The SARS-CoV-2 RNA is generally detectable in upper respiratory specimens during the acute phase of infection. The lowest concentration of SARS-CoV-2 viral copies this assay can detect is 138 copies/mL. A negative result does not preclude SARS-Cov-2 infection and should not be used as the sole basis for treatment or other patient management decisions. A negative result may occur with  improper specimen collection/handling, submission of specimen other than nasopharyngeal swab, presence of viral mutation(s) within the areas  targeted by this assay, and inadequate number of viral copies(<138 copies/mL). A negative result must be combined with clinical observations, patient history, and epidemiological information. The expected result is Negative.  Fact Sheet for Patients:  EntrepreneurPulse.com.au  Fact Sheet for Healthcare Providers:  IncredibleEmployment.be  This test is no t yet approved or cleared by the [place] FDA and  has been authorized for detection and/or diagnosis of SARS-CoV-2 by FDA under an Emergency Use Authorization (EUA). This EUA will remain  in effect (meaning this test can be used) for the duration of the COVID-19 declaration under Section 564(b)(1) of the Act, 21 U.S.C.section 360bbb-3(b)(1), unless the authorization is terminated  or revoked sooner.       Influenza A by PCR NEGATIVE NEGATIVE Final   Influenza B by PCR NEGATIVE NEGATIVE Final    Comment: (NOTE) The Xpert Xpress SARS-CoV-2/FLU/RSV plus assay is intended as an aid in the diagnosis of influenza from Nasopharyngeal swab specimens and should not be used as a sole basis for treatment. Nasal washings and aspirates are unacceptable for Xpert Xpress SARS-CoV-2/FLU/RSV testing.  Fact Sheet for Patients: EntrepreneurPulse.com.au  Fact Sheet for Healthcare Providers: IncredibleEmployment.be  This test is not yet approved or cleared by the [place] and  has been authorized for detection and/or diagnosis of SARS-CoV-2 by FDA under an Emergency Use Authorization (EUA). This EUA will remain in effect (meaning this test can be used) for the duration of the COVID-19 declaration under Section 564(b)(1) of the Act, 21 U.S.C. section 360bbb-3(b)(1), unless the authorization is terminated or revoked.  Performed at [facility], [place] [address] [place], [place] [address]   Resp Panel by RT-PCR (Flu A&B, Covid) Nasopharyngeal Swab      Status: None   Collection Time: 08/02/20  1:00 PM   Specimen: Nasopharyngeal Swab; Nasopharyngeal(NP) swabs in vial transport medium  Result Value Ref Range Status   SARS Coronavirus 2 by RT PCR NEGATIVE NEGATIVE Final    Comment: (NOTE) SARS-CoV-2 target nucleic acids are NOT DETECTED.  The SARS-CoV-2 RNA is generally detectable in upper respiratory specimens during the acute phase of infection. The lowest concentration of SARS-CoV-2 viral copies this assay can detect is 138 copies/mL. A negative result does not preclude SARS-Cov-2 infection and should not be used as the sole basis for treatment or other patient management decisions. A negative result may occur with  improper specimen collection/handling, submission of specimen other than nasopharyngeal swab, presence of viral mutation(s) within the areas targeted by this assay, and inadequate number of viral copies(<138 copies/mL). A negative result must be combined with clinical observations, patient history, and epidemiological information. The expected result is Negative.  Fact Sheet for Patients:  EntrepreneurPulse.com.au  Fact Sheet for Healthcare Providers:  IncredibleEmployment.be  This test is no t yet approved or cleared by the [place] FDA and  has been authorized for detection and/or diagnosis of SARS-CoV-2 by FDA under an Emergency Use Authorization (EUA). This EUA will remain  in effect (meaning this test can be used) for the duration of the COVID-19 declaration under Section 564(b)(1) of the Act, 21 U.S.C.section 360bbb-3(b)(1), unless the authorization is terminated  or revoked sooner.       Influenza A by PCR NEGATIVE NEGATIVE Final   Influenza B by PCR NEGATIVE NEGATIVE Final    Comment: (NOTE) The Xpert Xpress SARS-CoV-2/FLU/RSV plus assay is intended as an aid in the diagnosis of influenza from Nasopharyngeal swab specimens and should not be used as a sole basis  for treatment. Nasal washings and aspirates are unacceptable for Xpert Xpress SARS-CoV-2/FLU/RSV testing.  Fact Sheet for Patients: EntrepreneurPulse.com.au  Fact Sheet for Healthcare Providers: IncredibleEmployment.be  This test is not yet approved or cleared by the [place] FDA and has been authorized for detection and/or diagnosis of SARS-CoV-2 by FDA under an Emergency Use Authorization (EUA). This EUA will remain in effect (meaning this test can be used) for the duration of the COVID-19 declaration under Section 564(b)(1) of the Act, 21 U.S.C. section 360bbb-3(b)(1), unless the authorization is terminated or revoked.  Performed at [facility], [place] [address], [place], [place] [address]     Labs: BNP (last 3 results) Recent Labs    10/02/19 0831 10/04/19 0426 10/05/19 0209  BNP 58.9 66.7 95.2   Basic Metabolic Panel: Recent Labs  Lab 07/30/20 1829 08/01/20 0245  NA 136 137  K 4.3 3.9  CL 102 103  CO2 23 25  GLUCOSE 259* 151*  BUN 27* 26*  CREATININE 1.77* 1.48*  CALCIUM 9.7 9.8  PHOS  --  3.2   Liver Function Tests: Recent Labs  Lab 07/30/20 1829 08/01/20 0245  AST 25  --   ALT 29  --   ALKPHOS 111  --  BILITOT 0.9  --   PROT 7.5  --   ALBUMIN 4.0 3.4*   Recent Labs  Lab 07/30/20 1829  LIPASE 39   No results for input(s): AMMONIA in the last 168 hours. CBC: Recent Labs  Lab 07/30/20 1829 08/01/20 0245  WBC 7.9 7.9  NEUTROABS 5.6  --   HGB 18.4* 17.9*  HCT 55.8* 53.7*  MCV 86.2 86.1  PLT 202 187   Cardiac Enzymes: No results for input(s): CKTOTAL, CKMB, CKMBINDEX, TROPONINI in the last 168 hours. BNP: Invalid input(s): POCBNP CBG: Recent Labs  Lab 08/01/20 1653 08/01/20 2045 08/02/20 0834 08/02/20 1817 08/02/20 2155  GLUCAP 134* 191* 291* 248* 149*   D-Dimer No results for input(s): DDIMER in the last 72 hours. Hgb A1c No results for input(s): HGBA1C in the last 72  hours. Lipid Profile No results for input(s): CHOL, HDL, LDLCALC, TRIG, CHOLHDL, LDLDIRECT in the last 72 hours. Thyroid function studies Recent Labs    08/01/20 0749  TSH 0.688   Anemia work up Recent Labs    08/01/20 0749  VITAMINB12 405   Urinalysis    Component Value Date/Time   COLORURINE YELLOW 07/30/2020 [place] 07/30/2020 2319   LABSPEC 1.012 07/30/2020 2319   PHURINE 5.0 07/30/2020 2319   GLUCOSEU >=500 (A) 07/30/2020 2319   HGBUR NEGATIVE 07/30/2020 2319   BILIRUBINUR NEGATIVE 07/30/2020 2319   BILIRUBINUR negative 08/26/2019 1516   KETONESUR NEGATIVE 07/30/2020 2319   PROTEINUR 100 (A) 07/30/2020 2319   UROBILINOGEN 0.2 08/26/2019 1516   UROBILINOGEN 0.2 04/18/2008 1153   NITRITE NEGATIVE 07/30/2020 2319   LEUKOCYTESUR NEGATIVE 07/30/2020 2319   Sepsis Labs Invalid input(s): PROCALCITONIN,  WBC,  LACTICIDVEN Microbiology Recent Results (from the past 240 hour(s))  Resp Panel by RT-PCR (Flu A&B, Covid) Nasopharyngeal Swab     Status: None   Collection Time: 07/30/20  7:26 PM   Specimen: Nasopharyngeal Swab; Nasopharyngeal(NP) swabs in vial transport medium  Result Value Ref Range Status   SARS Coronavirus 2 by RT PCR NEGATIVE NEGATIVE Final    Comment: (NOTE) SARS-CoV-2 target nucleic acids are NOT DETECTED.  The SARS-CoV-2 RNA is generally detectable in upper respiratory specimens during the acute phase of infection. The lowest concentration of SARS-CoV-2 viral copies this assay can detect is 138 copies/mL. A negative result does not preclude SARS-Cov-2 infection and should not be used as the sole basis for treatment or other patient management decisions. A negative result may occur with  improper specimen collection/handling, submission of specimen other than nasopharyngeal swab, presence of viral mutation(s) within the areas targeted by this assay, and inadequate number of viral copies(<138 copies/mL). A negative result must be  combined with clinical observations, patient history, and epidemiological information. The expected result is Negative.  Fact Sheet for Patients:  EntrepreneurPulse.com.au  Fact Sheet for Healthcare Providers:  IncredibleEmployment.be  This test is no t yet approved or cleared by the [place] FDA and  has been authorized for detection and/or diagnosis of SARS-CoV-2 by FDA under an Emergency Use Authorization (EUA). This EUA will remain  in effect (meaning this test can be used) for the duration of the COVID-19 declaration under Section 564(b)(1) of the Act, 21 U.S.C.section 360bbb-3(b)(1), unless the authorization is terminated  or revoked sooner.       Influenza A by PCR NEGATIVE NEGATIVE Final   Influenza B by PCR NEGATIVE NEGATIVE Final    Comment: (NOTE) The Xpert Xpress SARS-CoV-2/FLU/RSV plus assay is intended as an aid  in the diagnosis of influenza from Nasopharyngeal swab specimens and should not be used as a sole basis for treatment. Nasal washings and aspirates are unacceptable for Xpert Xpress SARS-CoV-2/FLU/RSV testing.  Fact Sheet for Patients: EntrepreneurPulse.com.au  Fact Sheet for Healthcare Providers: IncredibleEmployment.be  This test is not yet approved or cleared by the [place] FDA and has been authorized for detection and/or diagnosis of SARS-CoV-2 by FDA under an Emergency Use Authorization (EUA). This EUA will remain in effect (meaning this test can be used) for the duration of the COVID-19 declaration under Section 564(b)(1) of the Act, 21 U.S.C. section 360bbb-3(b)(1), unless the authorization is terminated or revoked.  Performed at [facility], [place] [address] [place], [place] [address]   Resp Panel by RT-PCR (Flu A&B, Covid) Nasopharyngeal Swab     Status: None   Collection Time: 08/02/20  1:00 PM   Specimen: Nasopharyngeal Swab; Nasopharyngeal(NP) swabs  in vial transport medium  Result Value Ref Range Status   SARS Coronavirus 2 by RT PCR NEGATIVE NEGATIVE Final    Comment: (NOTE) SARS-CoV-2 target nucleic acids are NOT DETECTED.  The SARS-CoV-2 RNA is generally detectable in upper respiratory specimens during the acute phase of infection. The lowest concentration of SARS-CoV-2 viral copies this assay can detect is 138 copies/mL. A negative result does not preclude SARS-Cov-2 infection and should not be used as the sole basis for treatment or other patient management decisions. A negative result may occur with  improper specimen collection/handling, submission of specimen other than nasopharyngeal swab, presence of viral mutation(s) within the areas targeted by this assay, and inadequate number of viral copies(<138 copies/mL). A negative result must be combined with clinical observations, patient history, and epidemiological information. The expected result is Negative.  Fact Sheet for Patients:  EntrepreneurPulse.com.au  Fact Sheet for Healthcare Providers:  IncredibleEmployment.be  This test is no t yet approved or cleared by the [place] FDA and  has been authorized for detection and/or diagnosis of SARS-CoV-2 by FDA under an Emergency Use Authorization (EUA). This EUA will remain  in effect (meaning this test can be used) for the duration of the COVID-19 declaration under Section 564(b)(1) of the Act, 21 U.S.C.section 360bbb-3(b)(1), unless the authorization is terminated  or revoked sooner.       Influenza A by PCR NEGATIVE NEGATIVE Final   Influenza B by PCR NEGATIVE NEGATIVE Final    Comment: (NOTE) The Xpert Xpress SARS-CoV-2/FLU/RSV plus assay is intended as an aid in the diagnosis of influenza from Nasopharyngeal swab specimens and should not be used as a sole basis for treatment. Nasal washings and aspirates are unacceptable for Xpert Xpress  SARS-CoV-2/FLU/RSV testing.  Fact Sheet for Patients: EntrepreneurPulse.com.au  Fact Sheet for Healthcare Providers: IncredibleEmployment.be  This test is not yet approved or cleared by the [place] FDA and has been authorized for detection and/or diagnosis of SARS-CoV-2 by FDA under an Emergency Use Authorization (EUA). This EUA will remain in effect (meaning this test can be used) for the duration of the COVID-19 declaration under Section 564(b)(1) of the Act, 21 U.S.C. section 360bbb-3(b)(1), unless the authorization is terminated or revoked.  Performed at [facility], [place] [address] [place], [place] [address]    Time coordinating discharge: 35 minutes  SIGNED:  Kerney Elbe, DO [facility] 08/03/2020, 10:20 AM Pager is on [place]  If 7PM-7AM, please contact night-coverage [email or web address]

## 2020-08-03 NOTE — Progress Notes (Addendum)
Report given to Maudie Mercury, RN at [facility]. Made RN aware that the patient was very upset at this time that transport is here and he is unable to take a shower before heading to the facility. As a result his BP is elevated at this time but otherwise has been stable since overnight through this morning and I had given him his morning BP meds. All belongings and packet with signed script sent with transport.

## 2020-08-03 NOTE — Consult Note (Signed)
   [facility] Inpatient Consult   08/03/2020  [name] 09-19-1955 [id number]   [place]  Accountable Care Organization [ACO] Patient:  [name]  Patient was followed for  transition of care. Alerted by the [facility] Embedded practice of patient's hospitalization and ongoing needs. Plan: Notification sent to the [facility] Management and made aware patient transitioned to [facility].  Please contact for further questions,  Natividad Brood, RN BSN [facility] Liaison  [phone] business mobile phone Toll free office [phone]  Fax number: [phone] [email or web address]

## 2020-08-03 NOTE — Progress Notes (Signed)
Pt was advised to not get in shower and to complete bath at sink due to being high risk for falls. Pt went into bathroom and undressed to enter into shower by hisself. Sitter attempted to redirect Pt and again suggested to complete bath at sink. Pt continues to refused and continues to get in shower. MD aware and RN aware.

## 2020-08-03 NOTE — Progress Notes (Signed)
Attempted to call report. RN to call back. 

## 2020-08-03 NOTE — TOC Transition Note (Signed)
Transition of Care [facility]) - CM/SW Discharge Note   Patient Details  Name: [name] MRN: [id number] Date of Birth: 1955/10/25  [facility] CM/SW Contact:  Benard Halsted, LCSW Phone Number: 08/03/2020, 8:58 AM   Clinical Narrative:    Patient will DC to: [facility] Anticipated DC date: 08/03/20 Family notified: Pt notified friend Transport by: Corey Harold   Per MD patient ready for DC to [facility]. RN to call report prior to discharge [phone] room [phone]). RN, patient, patient's family, and facility notified of DC. Discharge Summary and FL2 sent to facility. DC packet on chart. Ambulance transport requested for patient.   CSW will sign off for now as social work intervention is no longer needed. Please consult [place] again if new needs arise.      Final next level of care: Skilled Nursing Facility Barriers to Discharge: Barriers Resolved   Patient Goals and CMS Choice Patient states their goals for this hospitalization and ongoing recovery are:: Rehab CMS Medicare.gov Compare Post Acute Care list provided to:: Patient Choice offered to / list presented to : Patient  Discharge Placement   Existing PASRR number confirmed : 08/02/20          Patient [facility] bed at: [facility] Patient to be transferred to facility by: [facility] Name of family member notified: Declined Patient and family notified of of transfer: 08/02/20  Discharge Plan and Services In-house Referral: Clinical Social Work Discharge Planning Services: CM Consult Post Acute Care Choice: [place]                               Social Determinants of Health (SDOH) Interventions     Readmission Risk Interventions Readmission Risk Prevention Plan 10/05/2019  Transportation Screening Complete  PCP or Specialist Appt within 5-7 Days Complete  Home Care Screening Complete  Medication Review (RN CM) Complete  Some recent data might be hidden

## 2020-08-04 LAB — GLUCOSE, CAPILLARY
Glucose-Capillary: 155 mg/dL — ABNORMAL HIGH (ref 70–99)
Glucose-Capillary: 188 mg/dL — ABNORMAL HIGH (ref 70–99)

## 2020-08-07 ENCOUNTER — Telehealth: Payer: Self-pay

## 2020-08-07 NOTE — Telephone Encounter (Signed)
  Care Management    Consult Note  08/07/2020 Name: SHUAIB CORSINO MRN: [id number] DOB: Jul 08, 1955  Care management team received notification of patient's recent inpatient hospitalization related to DM II, Dizziness, and Chest Pain .Based on review of health record, EASON HOUSMAN is currently active in the embedded care coordination program.  Review of patient status, including review of consultants reports, relevant laboratory and other test results, and collaboration with appropriate care team members and the patient's provider was performed as part of comprehensive patient evaluation and provision of chronic care management services.    SW collaboration with hospital liaison who reports patient discharge to [facility] for rehab.  Plan: Scheduled SW follow up with [facility] on 5.25.22 to assess patients progress in rehab.  [name], BSW, CDP Social Worker, Certified Dementia Practitioner [place] / [place] [facility] [phone]

## 2020-08-09 ENCOUNTER — Ambulatory Visit (INDEPENDENT_AMBULATORY_CARE_PROVIDER_SITE_OTHER): Payer: Medicare Other

## 2020-08-09 DIAGNOSIS — E1121 Type 2 diabetes mellitus with diabetic nephropathy: Secondary | ICD-10-CM | POA: Diagnosis not present

## 2020-08-09 DIAGNOSIS — I10 Essential (primary) hypertension: Secondary | ICD-10-CM | POA: Diagnosis not present

## 2020-08-09 DIAGNOSIS — N1831 Chronic kidney disease, stage 3a: Secondary | ICD-10-CM | POA: Diagnosis not present

## 2020-08-09 NOTE — Patient Instructions (Signed)
Social Worker Visit Information  Goals we discussed today:  Goals Addressed            This Visit's Progress   . Quality of Life Maintained       Timeframe:  Long-Range Goal Priority:  Medium Start Date:   5.25.22                          Expected End Date: 8.23.22                      Next planned outreach: 6.7.22  Patient Goals/Self-Care Activities . patient will:   - Discuss PCS benefit with his sister prior to next scheduled call -Contact his primary care provider as needed to schedule an appointment        Materials Provided: Verbal education about health plan benefit provided by phone  Follow Up Plan: Appointment scheduled for SW follow up with client by phone on: 6.7.22   [name], [place], CDP Social Worker, Certified Dementia Practitioner [place] [facility] [place] [facility] [phone]

## 2020-08-09 NOTE — Chronic Care Management (AMB) (Signed)
Chronic Care Management    Social Work Note  08/09/2020 Name: [name] MRN: [id number] DOB: 12-12-1955  [name] is a 65 y.o. year old male who is a primary care patient of [name], [place]. The CCM team was consulted to assist the patient with chronic disease management and/or care coordination needs related to: Level of Care Concerns.   Engaged with patient by telephone for follow up visit in response to provider referral for social work chronic care management and care coordination services.   Consent to Services:  The patient was given information about Chronic Care Management services, agreed to services, and gave verbal consent prior to initiation of services.  Please see initial visit note for detailed documentation.   Patient agreed to services and consent obtained.   Assessment: Review of patient past medical history, allergies, medications, and health status, including review of relevant consultants reports was performed today as part of a comprehensive evaluation and provision of chronic care management and care coordination services.     SDOH (Social Determinants of Health) assessments and interventions performed:    Advanced Directives Status: Not addressed in this encounter.  CCM Care Plan  No Known Allergies  Outpatient Encounter Medications as of 08/09/2020  Medication Sig  . acetaminophen (TYLENOL) 325 MG tablet Take 2 tablets (650 mg total) by mouth every 6 (six) hours as needed for mild pain (or Fever >/= 101).  Marland Kitchen aspirin EC 81 MG tablet Take 81 mg by mouth daily with breakfast.   . atorvastatin (LIPITOR) 20 MG tablet Take 1 tablet (20 mg total) by mouth daily.  . clotrimazole-betamethasone (LOTRISONE) cream Apply 1 application topically 2 (two) times daily. (Patient taking differently: Apply 1 application topically 2 (two) times daily as needed (rash/itching).)  . diphenhydrAMINE (BENADRYL) 25 MG tablet Take 25 mg by mouth every 6 (six) hours as needed for  allergies.  . metoprolol tartrate (LOPRESSOR) 50 MG tablet Take 1 tablet (50 mg total) by mouth 2 (two) times daily.  Marland Kitchen MYRBETRIQ 25 MG TB24 tablet TAKE 1 TABLET BY MOUTH EVERY DAY (Patient taking differently: Take 25 mg by mouth daily.)  . nystatin (NYSTATIN) powder Apply 1 application topically 3 (three) times daily. (Patient taking differently: Apply 1 application topically 2 (two) times daily as needed (itching/rash).)  . olmesartan-hydrochlorothiazide (BENICAR HCT) 40-25 MG tablet Take 1 tablet by mouth daily.  . Semaglutide (RYBELSUS) 14 MG TABS Take 1 tablet by mouth daily. 30 minutes before breakfast (Patient taking differently: Take 14 mg by mouth daily. 30 minutes before breakfast)  . sildenafil (REVATIO) 20 MG tablet TAKE 1 TABLET BY MOUTH EVERY DAY (PRIOR AUTHORIZATION DENIED) (Patient taking differently: Take 20 mg by mouth daily as needed (ed).)  . Tetrahydrozoline HCl (VISINE OP) Place 1 drop into both eyes daily as needed (itching/irritation).  . traMADol (ULTRAM) 50 MG tablet Take 1 tablet (50 mg total) by mouth every 6 (six) hours as needed for severe pain.  . traZODone (DESYREL) 50 MG tablet TAKE 1 TABLET (50 MG TOTAL) BY MOUTH AT BEDTIME AS NEEDED FOR SLEEP.   No facility-administered encounter medications on file as of 08/09/2020.    Patient Active Problem List   Diagnosis Date Noted  . Ataxia 08/01/2020  . Dizziness 07/31/2020  . Chest pain 07/31/2020  . Hip pain 07/31/2020  . AKI (acute kidney injury) ([place]) 02/11/2020  . Acute kidney injury superimposed on chronic kidney disease ([place]) 02/10/2020  . Hydroureteronephrosis 02/10/2020  . Nausea and vomiting 02/10/2020  .  History of COVID-19 02/10/2020  . Pneumonia due to COVID-19 virus 09/29/2019  . Acute on chronic kidney failure ([place]) 09/28/2019  . Hyponatremia 09/28/2019  . Sinus tachycardia 09/28/2019  . Insomnia 08/26/2018  . Nephropathy 03/26/2018  . Urinary urgency 03/26/2018  . Type 2 diabetes mellitus with  diabetic nephropathy, without long-term current use of insulin ([place]) 03/26/2018  . Essential hypertension 03/26/2018  . Chronic kidney disease (CKD), stage III (moderate) ([place]) 03/26/2018  . Conductive hearing loss, bilateral 11/07/2017  . Uncontrolled type 2 diabetes mellitus with hyperglycemia ([place]) 07/31/2016  . Dyslipidemia associated with type 2 diabetes mellitus ([place]) 05/29/2016  . Microcytic anemia 02/17/2012  . 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 Benign hypertension 02/17/2012  . Depressive disorder 02/17/2012    Conditions to be addressed/monitored: HTN, DMII and CKD Stage III; Level of care concerns  Care Plan : Social Work [place]  Updates made by [name] since 08/09/2020 12:00 AM    Problem: Quality of Life (General Plan of Care)     Long-Range Goal: Quality of Life Maintained   Start Date: 08/09/2020  Expected End Date: 11/07/2020  Priority: Medium  Note:   Current Barriers:  . Chronic disease management support and education needs related to HTN, DM, and CKD Stage III   . Limited access to caregiver . Recent inpatient admission   Social Worker Clinical Goal(s):  Marland Kitchen Patient will work with SW to identify and address any acute and/or chronic care coordination needs related to the self health management of HTN, DM, and CKD Stage III   . Patient will work with SW to become more knowledgeable of health plan benefits   SW Interventions:  . Inter-disciplinary care team collaboration (see longitudinal plan of care) . Collaboration with [name], [name] regarding development and update of comprehensive plan of care as evidenced by provider attestation and co-signature . Successful outbound call placed to [facility] to assess patient progress in rehab - determined the patient has signed himself out of rehab and is no longer at the community . Successful outbound call placed to the patient to assess for care coordination needs . The patient  reports he signed himself out of rehab due to the t.v in his room not working . Performed chart review to note the patient was hospitalized from 5/15-5/19 for dizziness, chest pain, and DM II . Patient reports he continues to be unbalanced while walking, feels light-headed or "swimmy headed" and has experienced some bad headaches at night . Discussed the patient was informed he had a narrowing blood vessel in his brain - he is unsure if this is causing his symptoms . Educated the patient on opportunity to request PCS in the home as part of his Medicaid benefit . Assessed for patient interest in applying for PCS- patient would like to speak with his sister prior to deciding . Scheduled follow up call to discuss PCS with the patient on June 7th . Collaboration with primary care provider [name] as well as CCM team to inform the patient has returned home and continues to complain of symptoms including light headedness, unbalanced, and headache  Patient Goals/Self-Care Activities . patient will:   -  Discuss PCS benefit with his sister prior to next scheduled call -Contact his primary care provider as needed to schedule an appointment  Follow Up Plan: Telephone follow up appointment with care management team member scheduled for:6.7.22       Follow Up Plan: Appointment scheduled for SW  follow up with client by phone on: 6.7.22      [name], [place], CDP Social Worker, Certified Dementia Practitioner [place] [facility] [place] [facility] [phone]  Total time spent performing care coordination and/or care management activities with the patient by phone or face to face = 40 minutes.

## 2020-08-10 ENCOUNTER — Telehealth: Payer: Self-pay | Admitting: *Deleted

## 2020-08-10 NOTE — Chronic Care Management (AMB) (Signed)
  Care Management   Note  08/10/2020 Name: [name] MRN: [id number] DOB: 10-Sep-1955  [name] is a 65 y.o. year old male who is a primary care patient of [name], [place] and is actively engaged with the care management team. I reached out to [name] by phone today to assist with re-scheduling a follow up visit with the Pharmacist  Follow up plan: Unsuccessful telephone outreach attempt made. A HIPAA compliant phone message was left for the patient providing contact information and requesting a return call.  The care management team will reach out to the patient again over the next 7 days.  If patient returns call to provider office, please advise to call [place] at [phone].  [place] Management

## 2020-08-13 ENCOUNTER — Other Ambulatory Visit: Payer: Self-pay | Admitting: Nurse Practitioner

## 2020-08-17 NOTE — Chronic Care Management (AMB) (Signed)
  Care Management   Note  08/17/2020 Name: [name] MRN: [id number] DOB: [birth year]  [name] is a 65 y.o. year old male who is a primary care patient of [name], [place] and is actively engaged with the care management team. I reached out to [name] by phone today to assist with re-scheduling a follow up visit with the Pharmacist  Follow up plan: Telephone appointment with care management team member scheduled for:09/05/2020  Ainslee Sou  Care Guide, Embedded Care Coordination [place]  Care Management

## 2020-08-22 ENCOUNTER — Ambulatory Visit (INDEPENDENT_AMBULATORY_CARE_PROVIDER_SITE_OTHER): Payer: Medicare Other

## 2020-08-22 DIAGNOSIS — E1121 Type 2 diabetes mellitus with diabetic nephropathy: Secondary | ICD-10-CM

## 2020-08-22 DIAGNOSIS — N1831 Chronic kidney disease, stage 3a: Secondary | ICD-10-CM | POA: Diagnosis not present

## 2020-08-22 DIAGNOSIS — I10 Essential (primary) hypertension: Secondary | ICD-10-CM | POA: Diagnosis not present

## 2020-08-22 NOTE — Chronic Care Management (AMB) (Signed)
Chronic Care Management    Social Work Note  08/22/2020 Name: [name] MRN: [id number] DOB: [birth year]  [name] is a 65 y.o. year old male who is a primary care patient of [name], [place]. The CCM team was consulted to assist the patient with chronic disease management and/or care coordination needs related to: Level of Care Concerns.   Engaged with patient by telephone for follow up visit in response to provider referral for social work chronic care management and care coordination services.   Consent to Services:  The patient was given information about Chronic Care Management services, agreed to services, and gave verbal consent prior to initiation of services.  Please see initial visit note for detailed documentation.   Patient agreed to services and consent obtained.   Assessment: Review of patient past medical history, allergies, medications, and health status, including review of relevant consultants reports was performed today as part of a comprehensive evaluation and provision of chronic care management and care coordination services.     SDOH (Social Determinants of Health) assessments and interventions performed:    Advanced Directives Status: Not addressed in this encounter.  CCM Care Plan  No Known Allergies  Outpatient Encounter Medications as of 08/22/2020  Medication Sig  . acetaminophen (TYLENOL) 325 MG tablet Take 2 tablets (650 mg total) by mouth every 6 (six) hours as needed for mild pain (or Fever >/= 101).  Marland Kitchen aspirin EC 81 MG tablet Take 81 mg by mouth daily with breakfast.   . atorvastatin (LIPITOR) 20 MG tablet Take 1 tablet (20 mg total) by mouth daily.  . clotrimazole-betamethasone (LOTRISONE) cream Apply 1 application topically 2 (two) times daily. (Patient taking differently: Apply 1 application topically 2 (two) times daily as needed (rash/itching).)  . diphenhydrAMINE (BENADRYL) 25 MG tablet Take 25 mg by mouth every 6 (six) hours as needed for  allergies.  . metoprolol tartrate (LOPRESSOR) 50 MG tablet Take 1 tablet (50 mg total) by mouth 2 (two) times daily.  Marland Kitchen MYRBETRIQ 25 MG TB24 tablet TAKE 1 TABLET BY MOUTH EVERY DAY (Patient taking differently: Take 25 mg by mouth daily.)  . nystatin (NYSTATIN) powder Apply 1 application topically 3 (three) times daily. (Patient taking differently: Apply 1 application topically 2 (two) times daily as needed (itching/rash).)  . olmesartan-hydrochlorothiazide (BENICAR HCT) 40-25 MG tablet TAKE 1 TABLET BY MOUTH EVERY DAY  . Semaglutide (RYBELSUS) 14 MG TABS Take 1 tablet by mouth daily. 30 minutes before breakfast (Patient taking differently: Take 14 mg by mouth daily. 30 minutes before breakfast)  . sildenafil (REVATIO) 20 MG tablet TAKE 1 TABLET BY MOUTH EVERY DAY (PRIOR AUTHORIZATION DENIED) (Patient taking differently: Take 20 mg by mouth daily as needed (ed).)  . Tetrahydrozoline HCl (VISINE OP) Place 1 drop into both eyes daily as needed (itching/irritation).  . traMADol (ULTRAM) 50 MG tablet Take 1 tablet (50 mg total) by mouth every 6 (six) hours as needed for severe pain.  . traZODone (DESYREL) 50 MG tablet TAKE 1 TABLET (50 MG TOTAL) BY MOUTH AT BEDTIME AS NEEDED FOR SLEEP.   No facility-administered encounter medications on file as of 08/22/2020.    Patient Active Problem List   Diagnosis Date Noted  . Ataxia 08/01/2020  . Dizziness 07/31/2020  . Chest pain 07/31/2020  . Hip pain 07/31/2020  . AKI (acute kidney injury) ([place]) 02/11/2020  . Acute kidney injury superimposed on chronic kidney disease ([place]) 02/10/2020  . Hydroureteronephrosis 02/10/2020  . Nausea and vomiting 02/10/2020  .  History of COVID-19 02/10/2020  . Pneumonia due to COVID-19 virus 09/29/2019  . Acute on chronic kidney failure ([place]) 09/28/2019  . Hyponatremia 09/28/2019  . Sinus tachycardia 09/28/2019  . Insomnia 08/26/2018  . Nephropathy 03/26/2018  . Urinary urgency 03/26/2018  . Type 2 diabetes mellitus with  diabetic nephropathy, without long-term current use of insulin ([place]) 03/26/2018  . Essential hypertension 03/26/2018  . Chronic kidney disease (CKD), stage III (moderate) ([place]) 03/26/2018  . Conductive hearing loss, bilateral 11/07/2017  . Uncontrolled type 2 diabetes mellitus with hyperglycemia ([place]) 07/31/2016  . Dyslipidemia associated with type 2 diabetes mellitus ([place]) 05/29/2016  . Microcytic anemia 02/17/2012  . 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 Benign hypertension 02/17/2012  . Depressive disorder 02/17/2012    Conditions to be addressed/monitored: HTN, DMII and CKD Stage III; ADL IADL limitations  Care Plan : Social Work [facility] Care Plan  Updates made by [name] since 08/22/2020 12:00 AM    Problem: Quality of Life (General Plan of Care)     Long-Range Goal: Quality of Life Maintained   Start Date: 08/09/2020  Expected End Date: 11/07/2020  This Visit's Progress: On track  Priority: Medium  Note:   Current Barriers:  . Chronic disease management support and education needs related to HTN, DM, and CKD Stage III   . Limited access to caregiver . Recent inpatient admission   Social Worker Clinical Goal(s):  Marland Kitchen Patient will work with SW to identify and address any acute and/or chronic care coordination needs related to the self health management of HTN, DM, and CKD Stage III   . Patient will work with SW to become more knowledgeable of health plan benefits   SW Interventions:  . Inter-disciplinary care team collaboration (see longitudinal plan of care) . Collaboration with [name], [name] regarding development and update of comprehensive plan of care as evidenced by provider attestation and co-signature . Successful outbound call placed to the patient to discuss Pcs benefit under Medicaid plan . Educated the patient on this service including process with application and what services are offerred in the home . Determined the patient would like to proceed with  initiating a PCS application . Advised the patient SW would begin application and send to his provider for completion . Performed chart review to note patient is scheduled to see his primary care provider on 6.23.22 . Advised the patient the form may not be completed until [place] office visit- patient stated understanding . SW initiated [facility] application, sent to provider office for primary care provider to complete . Collaboration with patients primary care provider to discuss interventions and plan for SW to assist the patient with PCS application if his provider feels it is appropriate . Scheduled follow up call over the next 30 days  Patient Goals/Self-Care Activities . patient will:   -  Attend upcomming office visit -Contact SW as needed prior to next scheduled call  Follow Up Plan:  SW will follow up with the patient over the next 30 days      Follow Up Plan: SW will follow up with patient by phone over the next 30 days      [name], BSW, CDP Social Worker, Certified Dementia Practitioner [place] [facility] [place] [facility] [phone]  Total time spent performing care coordination and/or care management activities with the patient by phone or face to face = 35 minutes.

## 2020-08-22 NOTE — Patient Instructions (Signed)
Social Worker Visit Information  Goals we discussed today:  Goals Addressed            This Visit's Progress   . Quality of Life Maintained       Timeframe:  Long-Range Goal Priority:  Medium Start Date:   5.25.22                          Expected End Date: 8.23.22                      Next planned outreach: 6.29.22  Patient Goals/Self-Care Activities . patient will:   - Attend upcomming office visit -Contact SW as needed prior to next scheduled call        Materials Provided: Verbal education about PCS benefit provided by phone  Follow Up Plan: SW will follow up with patient by phone over the next month   [name], BSW, CDP Social Worker, Certified Dementia Practitioner [place] [facility] [place] [facility] [phone]

## 2020-09-03 ENCOUNTER — Emergency Department (HOSPITAL_COMMUNITY)
Admission: EM | Admit: 2020-09-03 | Discharge: 2020-09-03 | Disposition: A | Discharge status: Home / Self Care | Payer: Medicare Other | Attending: Emergency Medicine | Admitting: Emergency Medicine

## 2020-09-03 ENCOUNTER — Other Ambulatory Visit: Payer: Self-pay

## 2020-09-03 ENCOUNTER — Encounter (HOSPITAL_COMMUNITY): Payer: Self-pay

## 2020-09-03 ENCOUNTER — Emergency Department (HOSPITAL_COMMUNITY): Payer: Medicare Other

## 2020-09-03 DIAGNOSIS — Z79899 Other long term (current) drug therapy: Secondary | ICD-10-CM | POA: Insufficient documentation

## 2020-09-03 DIAGNOSIS — Z7982 Long term (current) use of aspirin: Secondary | ICD-10-CM | POA: Insufficient documentation

## 2020-09-03 DIAGNOSIS — M549 Dorsalgia, unspecified: Secondary | ICD-10-CM | POA: Diagnosis not present

## 2020-09-03 DIAGNOSIS — Z8616 Personal history of COVID-19: Secondary | ICD-10-CM | POA: Insufficient documentation

## 2020-09-03 DIAGNOSIS — Z8546 Personal history of malignant neoplasm of prostate: Secondary | ICD-10-CM | POA: Diagnosis not present

## 2020-09-03 DIAGNOSIS — E1122 Type 2 diabetes mellitus with diabetic chronic kidney disease: Secondary | ICD-10-CM | POA: Insufficient documentation

## 2020-09-03 DIAGNOSIS — N183 Chronic kidney disease, stage 3 unspecified: Secondary | ICD-10-CM | POA: Insufficient documentation

## 2020-09-03 DIAGNOSIS — I129 Hypertensive chronic kidney disease with stage 1 through stage 4 chronic kidney disease, or unspecified chronic kidney disease: Secondary | ICD-10-CM | POA: Diagnosis not present

## 2020-09-03 DIAGNOSIS — M545 Low back pain, unspecified: Secondary | ICD-10-CM | POA: Diagnosis not present

## 2020-09-03 DIAGNOSIS — R6889 Other general symptoms and signs: Secondary | ICD-10-CM | POA: Diagnosis not present

## 2020-09-03 DIAGNOSIS — M5459 Other low back pain: Secondary | ICD-10-CM | POA: Diagnosis not present

## 2020-09-03 DIAGNOSIS — Z743 Need for continuous supervision: Secondary | ICD-10-CM | POA: Diagnosis not present

## 2020-09-03 DIAGNOSIS — R0902 Hypoxemia: Secondary | ICD-10-CM | POA: Diagnosis not present

## 2020-09-03 MED ORDER — LIDOCAINE 5 % EX PTCH: 2.0000 | MEDICATED_PATCH | CUTANEOUS | 0 refills | Status: DC

## 2020-09-03 MED ORDER — LIDOCAINE 5 % EX PTCH: 1.0000 | MEDICATED_PATCH | CUTANEOUS | 0 refills | Status: DC

## 2020-09-03 MED ORDER — DICLOFENAC SODIUM 1 % EX GEL: 4.0000 g | Freq: Four times a day (QID) | CUTANEOUS | 0 refills | Status: DC

## 2020-09-03 NOTE — ED Triage Notes (Signed)
Pt to ED by EMS from with c/o non traumatic back pain which began 3 -4 weeks ago. Arrives A+O, VSS, NADN.

## 2020-09-03 NOTE — ED Notes (Signed)
Pt refusing to be catheterize by a male employee. Pt is apprehensive about being "fondled" by a male employee in a homosexual manner. The policy and procedure was explained that two people would be present during the procedure. Pt is requesting a male staff member to perform the procedure. The pt has been observed several times by male staff masturbating in front of them . Pt also was masturbating the in ambulance enroute to hospital. Pt was informed that male staff was unavailable to perform the procedure. PT has refused and has decided to leave facility against medical advise

## 2020-09-03 NOTE — ED Provider Notes (Signed)
[facility] DEPT Provider Note   CSN: [id number] Arrival date & time: 09/03/20  0254     History Chief Complaint  Patient presents with   Back Pain    [name] is a 65 y.o. male.  The history is provided by the patient.  Back Pain Location:  Sacro-iliac joint Quality:  Cramping Radiates to:  Does not radiate Pain severity:  Severe Pain is:  Same all the time Onset quality:  Gradual Duration: several months. Chronicity:  Chronic Context: not physical stress, not recent illness, not recent injury and not twisting   Relieved by:  Nothing Worsened by:  Nothing Ineffective treatments:  None tried Associated symptoms: no abdominal pain, no abdominal swelling, no bladder incontinence, no bowel incontinence, no chest pain, no dysuria, no fever, no headaches, no leg pain, no numbness, no paresthesias, no pelvic pain, no perianal numbness, no tingling, no weakness and no weight loss   Risk factors: no hx of cancer       Past Medical History:  Diagnosis Date   Closed nondisplaced spiral fracture of shaft of right tibia    Complicated urinary tract infection 09/06/2016   Depression    Diabetes mellitus without complication (HCC)    GERD (gastroesophageal reflux disease)    Gout    right foot   Hemorrhoids    Hypertension    Hypertension    Macular degeneration    Microcytic anemia    Prostate cancer ([place]) 08/2009   Rectal bleeding 07/31/2016   Tubular adenoma     Patient Active Problem List   Diagnosis Date Noted   Ataxia 08/01/2020   Dizziness 07/31/2020   Chest pain 07/31/2020   Hip pain 07/31/2020   AKI (acute kidney injury) ([place]) 02/11/2020   Acute kidney injury superimposed on chronic kidney disease ([place]) 02/10/2020   Hydroureteronephrosis 02/10/2020   Nausea and vomiting 02/10/2020   History of COVID-19 02/10/2020   Pneumonia due to COVID-19 virus 09/29/2019   Acute on chronic kidney failure ([place]) 09/28/2019   Hyponatremia  09/28/2019   Sinus tachycardia 09/28/2019   Insomnia 08/26/2018   Nephropathy 03/26/2018   Urinary urgency 03/26/2018   Type 2 diabetes mellitus with diabetic nephropathy, without long-term current use of insulin ([place]) 03/26/2018   Essential hypertension 03/26/2018   Chronic kidney disease (CKD), stage III (moderate) ([place]) 03/26/2018   Conductive hearing loss, bilateral 11/07/2017   Uncontrolled type 2 diabetes mellitus with hyperglycemia ([place]) 07/31/2016   Dyslipidemia associated with type 2 diabetes mellitus ([place]) 05/29/2016   Microcytic anemia 02/17/2012   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   Benign hypertension 02/17/2012   Depressive disorder 02/17/2012    Past Surgical History:  Procedure Laterality Date   CYSTOSCOPY WITH RETROGRADE PYELOGRAM, URETEROSCOPY AND STENT PLACEMENT Right 07/31/2016   Procedure: CYSTOSCOPY WITH RIGHT  RETROGRADE PYELOGRAM, URETEROSCOPY;  Surgeon: [name], MD;  Location: [facility];  Service: Urology;  Laterality: Right;   INGUINAL HERNIA REPAIR     Left ankle joint fusion  1981   PROSTATE BIOPSY     x 2   URETERAL REIMPLANTION  07/31/2016   Procedure: URETERAL REIMPLANT, right boari flap, right psoas hitch;  Surgeon: [name], MD;  Location: [facility];  Service: Urology;;       Family History  Problem Relation Age of Onset   Lung cancer Mother        Deceased   Throat cancer Brother    Pancreatic cancer Father    Heart disease  Father        Deceased   Lung cancer Maternal Uncle        nephew   Diabetes Other     Social History   Tobacco Use   Smoking status: Never   Smokeless tobacco: Never  Vaping Use   Vaping Use: Never used  Substance Use Topics   Alcohol use: Yes    Comment: ocassionally   Drug use: No    Home Medications Prior to Admission medications   Medication Sig Start Date End Date Taking? Authorizing Provider  diclofenac Sodium (VOLTAREN) 1 % GEL Apply 4 g topically 4 (four) times daily. 09/03/20   Yes Samani Deal, MD  lidocaine (LIDODERM) 5 % Place 2 patches onto the skin daily. Remove & Discard patch within 12 hours or as directed by MD 09/03/20  Yes Rayshon Albaugh, MD  lidocaine (LIDODERM) 5 % Place 1 patch onto the skin daily. Remove & Discard patch within 12 hours or as directed by MD 09/03/20  Yes Jeremia Groot, MD  acetaminophen (TYLENOL) 325 MG tablet Take 2 tablets (650 mg total) by mouth every 6 (six) hours as needed for mild pain (or Fever >/= 101). 02/12/20   Domenic Polite, MD  aspirin EC 81 MG tablet Take 81 mg by mouth daily with breakfast.     [provider]  atorvastatin (LIPITOR) 20 MG tablet Take 1 tablet (20 mg total) by mouth daily. 03/13/20   [name], [name]  clotrimazole-betamethasone (LOTRISONE) cream Apply 1 application topically 2 (two) times daily. Patient taking differently: Apply 1 application topically 2 (two) times daily as needed (rash/itching). 07/17/20   [name], [name]  diphenhydrAMINE (BENADRYL) 25 MG tablet Take 25 mg by mouth every 6 (six) hours as needed for allergies.    [provider]  metoprolol tartrate (LOPRESSOR) 50 MG tablet Take 1 tablet (50 mg total) by mouth 2 (two) times daily. 08/03/20   Sheikh, Omair Latif, DO  MYRBETRIQ 25 MG TB24 tablet TAKE 1 TABLET BY MOUTH EVERY DAY Patient taking differently: Take 25 mg by mouth daily. 03/29/20   [name], [name]  nystatin (NYSTATIN) powder Apply 1 application topically 3 (three) times daily. Patient taking differently: Apply 1 application topically 2 (two) times daily as needed (itching/rash). 08/26/19   [name], [name]  olmesartan-hydrochlorothiazide (BENICAR HCT) 40-25 MG tablet TAKE 1 TABLET BY MOUTH EVERY DAY 08/15/20   [name], [name]  Semaglutide (RYBELSUS) 14 MG TABS Take 1 tablet by mouth daily. 30 minutes before breakfast Patient taking differently: Take 14 mg by mouth daily. 30 minutes before breakfast 07/17/20   [name], [name]  sildenafil (REVATIO) 20 MG  tablet TAKE 1 TABLET BY MOUTH EVERY DAY (PRIOR AUTHORIZATION DENIED) Patient taking differently: Take 20 mg by mouth daily as needed (ed). 01/18/20   [name], [name]  Tetrahydrozoline HCl (VISINE OP) Place 1 drop into both eyes daily as needed (itching/irritation).    [provider]  traMADol (ULTRAM) 50 MG tablet Take 1 tablet (50 mg total) by mouth every 6 (six) hours as needed for severe pain. 08/03/20   Raiford Noble Latif, DO  traZODone (DESYREL) 50 MG tablet TAKE 1 TABLET (50 MG TOTAL) BY MOUTH AT BEDTIME AS NEEDED FOR SLEEP. 06/05/20   [name], [name]    Allergies    Patient has no known allergies.  Review of Systems   Review of Systems  Constitutional:  Negative for fever and weight loss.  HENT:  Negative for drooling.  Eyes:  Negative for redness.  Respiratory:  Negative for wheezing.   Cardiovascular:  Negative for chest pain.  Gastrointestinal:  Negative for abdominal pain and bowel incontinence.  Genitourinary:  Negative for bladder incontinence, difficulty urinating, dysuria, flank pain, frequency, pelvic pain, penile discharge and penile swelling.  Musculoskeletal:  Positive for back pain.  Neurological:  Negative for tingling, weakness, numbness, headaches and paresthesias.  Psychiatric/Behavioral:  Negative for agitation.   All other systems reviewed and are negative.  Physical Exam Updated Vital Signs BP (!) 165/107 (BP Location: Right Arm)   Pulse 61   Temp 98.4 F (36.9 C) (Oral)   Resp 18   Ht 5\' 6"  (1.676 m)   Wt 79.4 kg   SpO2 99%   BMI 28.25 kg/m   Physical Exam Vitals and nursing note reviewed.  Constitutional:      General: He is not in acute distress.    Appearance: Normal appearance.     Comments: Gait witnessed and is normal   HENT:     Head: Normocephalic and atraumatic.     Nose: Nose normal.  Eyes:     Extraocular Movements: Extraocular movements intact.     Pupils: Pupils are equal, round, and reactive to light.   Cardiovascular:     Rate and Rhythm: Normal rate and regular rhythm.     Pulses: Normal pulses.     Heart sounds: Normal heart sounds.  Pulmonary:     Effort: Pulmonary effort is normal.     Breath sounds: Normal breath sounds.  Abdominal:     General: Abdomen is flat. Bowel sounds are normal.     Palpations: Abdomen is soft.     Tenderness: There is no abdominal tenderness. There is no guarding or rebound.     Hernia: No hernia is present.  Musculoskeletal:        General: No tenderness. Normal range of motion.     Cervical back: Normal, normal range of motion and neck supple.     Thoracic back: Normal.     Lumbar back: Normal.     Comments: Intact DTRs intact perineal sensation.  Intact L5/s1  Skin:    General: Skin is warm and dry.     Capillary Refill: Capillary refill takes less than 2 seconds.  Neurological:     General: No focal deficit present.     Mental Status: He is alert and oriented to person, place, and time.     Deep Tendon Reflexes: Reflexes normal.  Psychiatric:        Mood and Affect: Mood normal.        Behavior: Behavior normal.    ED Results / Procedures / Treatments   Labs (all labs ordered are listed, but only abnormal results are displayed) Labs Reviewed  URINE CULTURE  URINALYSIS, ROUTINE W REFLEX MICROSCOPIC    EKG None  Radiology DG Lumbar Spine Complete  Result Date: 09/03/2020 CLINICAL DATA:  Nontraumatic back pain beginning 4 weeks ago. EXAM: LUMBAR SPINE - COMPLETE 4+ VIEW COMPARISON:  None. FINDINGS: Five lumbar type vertebral bodies. Normal alignment. No vertebral compression deformities. No focal bone lesion or bone destruction. Bone cortex appears intact. Mild degenerative changes with intervertebral disc space narrowing and endplate osteophyte formation mainly in the upper lumbar region. Normal alignment of the facet joints. Visualized sacrum appears intact. IMPRESSION: Mild degenerative changes.  No acute displaced fractures  identified. Electronically Signed   By: Lucienne Capers M.D.   On: 09/03/2020 03:42  Procedures Procedures   Medications Ordered in ED Medications - No data to display  ED Course  I have reviewed the triage vital signs and the nursing notes.  Pertinent labs & imaging results that were available during my care of the patient were reviewed by me and considered in my medical decision making (see chart for details).    Normal xrays with normal exam.  No neurologic or vascular compromise.  Seen masturbating in triage.  Stable for discharge with close follow up.  Call your PKMD for ongoing care.    [name] was evaluated in Emergency Department on 09/03/2020 for the symptoms described in the history of present illness. He was evaluated in the context of the global COVID-19 pandemic, which necessitated consideration that the patient might be at risk for infection with the SARS-CoV-2 virus that causes COVID-19. Institutional protocols and algorithms that pertain to the evaluation of patients at risk for COVID-19 are in a state of rapid change based on information released by regulatory bodies including the CDC and federal and state organizations. These policies and algorithms were followed during the patient's care in the ED.  Final Clinical Impression(s) / ED Diagnoses Final diagnoses:  Low back pain without sciatica, unspecified back pain laterality, unspecified chronicity      Return for intractable cough, coughing up blood, fevers > 100.4 unrelieved by medication, shortness of breath, intractable vomiting, chest pain, shortness of breath, weakness, numbness, changes in speech, facial asymmetry, abdominal pain, passing out, Inability to tolerate liquids or food, cough, altered mental status or any concerns. No signs of systemic illness or infection. The patient is nontoxic-appearing on exam and vital signs are within normal limits. I have reviewed the triage vital signs and the nursing  notes. Pertinent labs & imaging results that were available during my care of the patient were reviewed by me and considered in my medical decision making (see chart for details). After history, exam, and medical workup I feel the patient has been appropriately medically screened and is safe for discharge home. Pertinent diagnoses were discussed with the patient. Patient was given return precautions.  Rx / DC Orders ED Discharge Orders          Ordered    lidocaine (LIDODERM) 5 %  Every 24 hours        09/03/20 0337    lidocaine (LIDODERM) 5 %  Every 24 hours        09/03/20 0436    diclofenac Sodium (VOLTAREN) 1 % GEL  4 times daily        09/03/20 [place], Connie Hilgert, MD 09/03/20 [phone]

## 2020-09-04 ENCOUNTER — Ambulatory Visit: Payer: Medicare Other

## 2020-09-04 ENCOUNTER — Telehealth: Payer: Self-pay

## 2020-09-04 DIAGNOSIS — E1121 Type 2 diabetes mellitus with diabetic nephropathy: Secondary | ICD-10-CM

## 2020-09-04 DIAGNOSIS — I10 Essential (primary) hypertension: Secondary | ICD-10-CM

## 2020-09-04 NOTE — Chronic Care Management (AMB) (Signed)
  Patient aware of telephone appointment with Orlando Penner CPP on 09-05-2020 at 3:45. Patient aware to have/bring all medications, supplements, blood pressure and/or blood sugar logs to visit.  Questions: Have you had any recent office visit or specialist visit outside of [facility]? Patient stated No  Are there any concerns you would like to discuss during your office visit? Patient stated No.  Are you having any problems obtaining your medications? Patient stated no  NOTES Patient would like to reschedule appointment with PCP. Sent message to scheduling to contact patient.  Star Rating Drug: Rybelsus 14mg - Last filled 07-18-2020 [facility] Olmesartan-HCTZ 40-25mg - Last filled 05-18-2020 [facility] Atorvastatin 20mg - Last filled 07-25-2020 [facility]  Any gaps in medications fill history? No  [facility] Pharmacist Assistant [phone]

## 2020-09-04 NOTE — Chronic Care Management (AMB) (Signed)
  Care Management    Consult Note  09/04/2020 Name: [name] MRN: [id number] DOB: 17-Aug-1955  Care management team received notification of patient's recent emergency department visit related to  Low Back Pain without Sciatica  .Based on review of health record, [name] is currently active in the embedded care coordination program.  Review of patient status, including review of consultants reports, relevant laboratory and other test results, and collaboration with appropriate care team members and the patient's provider was performed as part of comprehensive patient evaluation and provision of chronic care management services.    SW collaboration with Orlando Penner PharmD who has a scheduled call with the patient on 6.21.22.    Plan:  Patient will be contacted by care management staff as previously scheduled.  [name], BSW, CDP Social Worker, Certified Dementia Practitioner [facility] [phone]  Total time spent performing care coordination and/or care management activities with the patient by phone or face to face = 20 minutes.

## 2020-09-05 ENCOUNTER — Ambulatory Visit: Payer: Self-pay

## 2020-09-05 ENCOUNTER — Telehealth: Payer: Self-pay

## 2020-09-05 NOTE — Telephone Encounter (Signed)
Transition Care Management Follow-up Telephone Call Date of discharge and from where: 09/03/2020 How have you been since you were released from the hospital?  Pt states he is still a little irritant and unhappy from experience of visit.  Any questions or concerns? No  Items Reviewed: Did the pt receive and understand the discharge instructions provided? Yes  Medications obtained and verified? Yes  Other? No  Any new allergies since your discharge? No  Dietary orders reviewed? Yes Do you have support at home? Yes   Home Care and Equipment/Supplies: Were home health services ordered? not applicable If so, what is the name of the agency? No   Has the agency set up a time to come to the patient's home? not applicable Were any new equipment or medical supplies ordered?  No What is the name of the medical supply agency? N/a Were you able to get the supplies/equipment? not applicable Do you have any questions related to the use of the equipment or supplies? No  Functional Questionnaire: (I = Independent and D = Dependent) ADLs: I  Bathing/Dressing- I  Meal Prep- I  Eating- I  Maintaining continence- I  Transferring/Ambulation- I  Managing Meds- I  Follow up appointments reviewed:  PCP Hospital f/u appt confirmed? Yes  Scheduled to see [name] on 09/21/2020 @ [facility] * only date convenient for pt. [facility] f/u appt confirmed?  No Scheduled to see n/a on n/a @ . Are transportation arrangements needed? No  If their condition worsens, is the pt aware to call PCP or go to the Emergency Dept.? Yes Was the patient provided with contact information for the PCP's office or ED? Yes Was to pt encouraged to call back with questions or concerns? Yes

## 2020-09-06 NOTE — Progress Notes (Signed)
A user error has taken place: encounter opened in error, closed for administrative reasons.

## 2020-09-07 ENCOUNTER — Ambulatory Visit: Payer: Medicare (Managed Care) | Admitting: Nurse Practitioner

## 2020-09-07 DIAGNOSIS — M25552 Pain in left hip: Secondary | ICD-10-CM | POA: Diagnosis not present

## 2020-09-07 DIAGNOSIS — M545 Low back pain, unspecified: Secondary | ICD-10-CM | POA: Diagnosis not present

## 2020-09-11 ENCOUNTER — Telehealth: Payer: Self-pay

## 2020-09-11 ENCOUNTER — Telehealth: Payer: Medicare Other

## 2020-09-11 NOTE — Telephone Encounter (Signed)
  Care Management   Follow Up Note   09/11/2020 Name: [name] MRN: [id number] DOB: 12-05-1955   Referred by: [name], [name] Reason for referral : Chronic Care Management (RNCM Follow up call - 1st attempt )   An unsuccessful telephone outreach was attempted today. The patient was referred to the case management team for assistance with care management and care coordination.   Follow Up Plan: Telephone follow up appointment with care management team member scheduled for: 09/25/20  Barb Merino, RN, BSN, CCM Care Management Coordinator [facility]  Direct Phone: [phone]

## 2020-09-13 ENCOUNTER — Telehealth: Payer: Medicare Other

## 2020-09-15 ENCOUNTER — Emergency Department (HOSPITAL_COMMUNITY): Payer: Medicare Other

## 2020-09-15 ENCOUNTER — Encounter (HOSPITAL_COMMUNITY): Payer: Self-pay

## 2020-09-15 ENCOUNTER — Ambulatory Visit (INDEPENDENT_AMBULATORY_CARE_PROVIDER_SITE_OTHER): Payer: Medicare Other

## 2020-09-15 ENCOUNTER — Other Ambulatory Visit: Payer: Self-pay

## 2020-09-15 ENCOUNTER — Observation Stay (HOSPITAL_COMMUNITY)
Admission: EM | Admit: 2020-09-15 | Discharge: 2020-09-17 | Disposition: A | Discharge status: Home / Self Care | Payer: Medicare Other | Attending: Internal Medicine | Admitting: Internal Medicine

## 2020-09-15 DIAGNOSIS — I10 Essential (primary) hypertension: Secondary | ICD-10-CM

## 2020-09-15 DIAGNOSIS — E1122 Type 2 diabetes mellitus with diabetic chronic kidney disease: Secondary | ICD-10-CM | POA: Diagnosis not present

## 2020-09-15 DIAGNOSIS — Z20822 Contact with and (suspected) exposure to covid-19: Secondary | ICD-10-CM | POA: Diagnosis not present

## 2020-09-15 DIAGNOSIS — G319 Degenerative disease of nervous system, unspecified: Secondary | ICD-10-CM | POA: Diagnosis not present

## 2020-09-15 DIAGNOSIS — Z8616 Personal history of COVID-19: Secondary | ICD-10-CM | POA: Insufficient documentation

## 2020-09-15 DIAGNOSIS — E785 Hyperlipidemia, unspecified: Secondary | ICD-10-CM

## 2020-09-15 DIAGNOSIS — E1169 Type 2 diabetes mellitus with other specified complication: Secondary | ICD-10-CM | POA: Diagnosis not present

## 2020-09-15 DIAGNOSIS — F329 Major depressive disorder, single episode, unspecified: Secondary | ICD-10-CM

## 2020-09-15 DIAGNOSIS — Z8546 Personal history of malignant neoplasm of prostate: Secondary | ICD-10-CM | POA: Diagnosis not present

## 2020-09-15 DIAGNOSIS — Z7982 Long term (current) use of aspirin: Secondary | ICD-10-CM | POA: Diagnosis not present

## 2020-09-15 DIAGNOSIS — I6782 Cerebral ischemia: Secondary | ICD-10-CM | POA: Diagnosis not present

## 2020-09-15 DIAGNOSIS — R42 Dizziness and giddiness: Secondary | ICD-10-CM

## 2020-09-15 DIAGNOSIS — Z79899 Other long term (current) drug therapy: Secondary | ICD-10-CM | POA: Diagnosis not present

## 2020-09-15 DIAGNOSIS — N179 Acute kidney failure, unspecified: Secondary | ICD-10-CM | POA: Diagnosis not present

## 2020-09-15 DIAGNOSIS — N433 Hydrocele, unspecified: Secondary | ICD-10-CM

## 2020-09-15 DIAGNOSIS — I129 Hypertensive chronic kidney disease with stage 1 through stage 4 chronic kidney disease, or unspecified chronic kidney disease: Secondary | ICD-10-CM | POA: Insufficient documentation

## 2020-09-15 DIAGNOSIS — E1140 Type 2 diabetes mellitus with diabetic neuropathy, unspecified: Secondary | ICD-10-CM | POA: Diagnosis not present

## 2020-09-15 DIAGNOSIS — Z743 Need for continuous supervision: Secondary | ICD-10-CM | POA: Diagnosis not present

## 2020-09-15 DIAGNOSIS — I499 Cardiac arrhythmia, unspecified: Secondary | ICD-10-CM | POA: Diagnosis not present

## 2020-09-15 DIAGNOSIS — N1831 Chronic kidney disease, stage 3a: Secondary | ICD-10-CM | POA: Insufficient documentation

## 2020-09-15 DIAGNOSIS — E1121 Type 2 diabetes mellitus with diabetic nephropathy: Secondary | ICD-10-CM | POA: Diagnosis not present

## 2020-09-15 DIAGNOSIS — R6889 Other general symptoms and signs: Secondary | ICD-10-CM | POA: Diagnosis not present

## 2020-09-15 DIAGNOSIS — R000 Tachycardia, unspecified: Secondary | ICD-10-CM | POA: Diagnosis not present

## 2020-09-15 DIAGNOSIS — R404 Transient alteration of awareness: Secondary | ICD-10-CM | POA: Diagnosis not present

## 2020-09-15 DIAGNOSIS — R2681 Unsteadiness on feet: Secondary | ICD-10-CM | POA: Diagnosis not present

## 2020-09-15 LAB — URINALYSIS, ROUTINE W REFLEX MICROSCOPIC
Bilirubin Urine: NEGATIVE
Glucose, UA: 150 mg/dL — AB
Hgb urine dipstick: NEGATIVE
Ketones, ur: NEGATIVE mg/dL
Leukocytes,Ua: NEGATIVE
Nitrite: NEGATIVE
Protein, ur: 100 mg/dL — AB
Specific Gravity, Urine: 1.018 (ref 1.005–1.030)
pH: 5 (ref 5.0–8.0)

## 2020-09-15 LAB — COMPREHENSIVE METABOLIC PANEL
ALT: 26 U/L (ref 0–44)
AST: 25 U/L (ref 15–41)
Albumin: 3.8 g/dL (ref 3.5–5.0)
Alkaline Phosphatase: 91 U/L (ref 38–126)
Anion gap: 9 (ref 5–15)
BUN: 29 mg/dL — ABNORMAL HIGH (ref 8–23)
CO2: 24 mmol/L (ref 22–32)
Calcium: 9.6 mg/dL (ref 8.9–10.3)
Chloride: 103 mmol/L (ref 98–111)
Creatinine, Ser: 1.83 mg/dL — ABNORMAL HIGH (ref 0.61–1.24)
GFR, Estimated: 40 mL/min — ABNORMAL LOW (ref >60)
Glucose, Bld: 203 mg/dL — ABNORMAL HIGH (ref 70–99)
Potassium: 4.8 mmol/L (ref 3.5–5.1)
Sodium: 136 mmol/L (ref 135–145)
Total Bilirubin: 1.2 mg/dL (ref 0.3–1.2)
Total Protein: 7.2 g/dL (ref 6.5–8.1)

## 2020-09-15 LAB — TROPONIN I (HIGH SENSITIVITY)
Troponin I (High Sensitivity): 7 ng/L (ref <18)
Troponin I (High Sensitivity): 7 ng/L (ref <18)

## 2020-09-15 LAB — CBC WITH DIFFERENTIAL/PLATELET
Abs Immature Granulocytes: 0.02 10*3/uL (ref 0.00–0.07)
Basophils Absolute: 0.1 10*3/uL (ref 0.0–0.1)
Basophils Relative: 1 %
Eosinophils Absolute: 0.1 10*3/uL (ref 0.0–0.5)
Eosinophils Relative: 2 %
HCT: 54.2 % — ABNORMAL HIGH (ref 39.0–52.0)
Hemoglobin: 18 g/dL — ABNORMAL HIGH (ref 13.0–17.0)
Immature Granulocytes: 0 %
Lymphocytes Relative: 11 %
Lymphs Abs: 1 10*3/uL (ref 0.7–4.0)
MCH: 28.6 pg (ref 26.0–34.0)
MCHC: 33.2 g/dL (ref 30.0–36.0)
MCV: 86.2 fL (ref 80.0–100.0)
Monocytes Absolute: 0.7 10*3/uL (ref 0.1–1.0)
Monocytes Relative: 8 %
Neutro Abs: 6.8 10*3/uL (ref 1.7–7.7)
Neutrophils Relative %: 78 %
Platelets: 226 10*3/uL (ref 150–400)
RBC: 6.29 MIL/uL — ABNORMAL HIGH (ref 4.22–5.81)
RDW: 13.2 % (ref 11.5–15.5)
WBC: 8.6 10*3/uL (ref 4.0–10.5)
nRBC: 0 % (ref 0.0–0.2)

## 2020-09-15 LAB — GLUCOSE, CAPILLARY: Glucose-Capillary: 235 mg/dL — ABNORMAL HIGH (ref 70–99)

## 2020-09-15 MED ORDER — SODIUM CHLORIDE 0.9% FLUSH
3.0000 mL | Freq: Two times a day (BID) | INTRAVENOUS | Status: DC
  Administered 2020-09-15 – 2020-09-17 (×3): 3 mL via INTRAVENOUS

## 2020-09-15 MED ORDER — INSULIN ASPART 100 UNIT/ML IJ SOLN
0.0000 [IU] | Freq: Three times a day (TID) | INTRAMUSCULAR | Status: DC
  Administered 2020-09-16 – 2020-09-17 (×4): 3 [IU] via SUBCUTANEOUS

## 2020-09-15 MED ORDER — ACETAMINOPHEN 325 MG PO TABS: 650.0000 mg | ORAL_TABLET | Freq: Four times a day (QID) | ORAL | Status: DC | PRN

## 2020-09-15 MED ORDER — HYDROCHLOROTHIAZIDE 25 MG PO TABS
25.0000 mg | ORAL_TABLET | Freq: Every day | ORAL | Status: DC
  Administered 2020-09-15 – 2020-09-16 (×2): 25 mg via ORAL
  Filled 2020-09-15 (×2): qty 1

## 2020-09-15 MED ORDER — SODIUM CHLORIDE 0.9 % IV SOLN: INTRAVENOUS | Status: AC

## 2020-09-15 MED ORDER — TETRAHYDROZOLINE HCL 0.05 % OP SOLN: 2.0000 [drp] | Freq: Every day | OPHTHALMIC | Status: DC | PRN

## 2020-09-15 MED ORDER — ACETAMINOPHEN 650 MG RE SUPP: 650.0000 mg | Freq: Four times a day (QID) | RECTAL | Status: DC | PRN

## 2020-09-15 MED ORDER — ENOXAPARIN SODIUM 40 MG/0.4ML IJ SOSY
40.0000 mg | PREFILLED_SYRINGE | INTRAMUSCULAR | Status: DC
  Administered 2020-09-15 – 2020-09-16 (×2): 40 mg via SUBCUTANEOUS
  Filled 2020-09-15 (×3): qty 0.4

## 2020-09-15 MED ORDER — LIDOCAINE 5 % EX PTCH: 2.0000 | MEDICATED_PATCH | CUTANEOUS | Status: DC | PRN

## 2020-09-15 MED ORDER — CYANOCOBALAMIN 500 MCG PO TABS
250.0000 ug | ORAL_TABLET | Freq: Every day | ORAL | Status: DC
  Administered 2020-09-16 – 2020-09-17 (×2): 250 ug via ORAL
  Filled 2020-09-15 (×2): qty 1

## 2020-09-15 MED ORDER — TRAMADOL HCL 50 MG PO TABS
50.0000 mg | ORAL_TABLET | Freq: Four times a day (QID) | ORAL | Status: DC | PRN
  Administered 2020-09-16: 50 mg via ORAL
  Filled 2020-09-15: qty 1

## 2020-09-15 MED ORDER — ASPIRIN EC 81 MG PO TBEC
81.0000 mg | DELAYED_RELEASE_TABLET | Freq: Every day | ORAL | Status: DC
  Administered 2020-09-16 – 2020-09-17 (×2): 81 mg via ORAL
  Filled 2020-09-15 (×2): qty 1

## 2020-09-15 MED ORDER — TRAZODONE HCL 50 MG PO TABS
50.0000 mg | ORAL_TABLET | Freq: Every evening | ORAL | Status: DC | PRN
  Administered 2020-09-15 – 2020-09-16 (×2): 50 mg via ORAL
  Filled 2020-09-15 (×2): qty 1

## 2020-09-15 MED ORDER — INSULIN ASPART 100 UNIT/ML IJ SOLN
0.0000 [IU] | Freq: Every day | INTRAMUSCULAR | Status: DC
  Administered 2020-09-15: 2 [IU] via SUBCUTANEOUS

## 2020-09-15 MED ORDER — OLMESARTAN MEDOXOMIL 20 MG PO TABS
40.0000 mg | ORAL_TABLET | Freq: Every day | ORAL | Status: DC
  Administered 2020-09-15 – 2020-09-16 (×2): 40 mg via ORAL
  Filled 2020-09-15: qty 2
  Filled 2020-09-15: qty 1
  Filled 2020-09-15: qty 2

## 2020-09-15 MED ORDER — SODIUM CHLORIDE 0.9 % IV BOLUS
500.0000 mL | Freq: Once | INTRAVENOUS | Status: AC
  Administered 2020-09-15: 500 mL via INTRAVENOUS

## 2020-09-15 MED ORDER — POLYETHYLENE GLYCOL 3350 17 G PO PACK: 17.0000 g | PACK | Freq: Every day | ORAL | Status: DC | PRN

## 2020-09-15 MED ORDER — ATORVASTATIN CALCIUM 10 MG PO TABS
20.0000 mg | ORAL_TABLET | Freq: Every day | ORAL | Status: DC
  Administered 2020-09-16 – 2020-09-17 (×2): 20 mg via ORAL
  Filled 2020-09-15 (×2): qty 2

## 2020-09-15 MED ORDER — DIPHENHYDRAMINE HCL 25 MG PO CAPS
25.0000 mg | ORAL_CAPSULE | Freq: Three times a day (TID) | ORAL | Status: DC | PRN
  Administered 2020-09-15: 25 mg via ORAL
  Filled 2020-09-15: qty 1

## 2020-09-15 MED ORDER — MECLIZINE HCL 25 MG PO TABS
25.0000 mg | ORAL_TABLET | Freq: Once | ORAL | Status: AC
  Administered 2020-09-15: 25 mg via ORAL
  Filled 2020-09-15: qty 1

## 2020-09-15 MED ORDER — OLMESARTAN MEDOXOMIL-HCTZ 40-25 MG PO TABS: 1.0000 | ORAL_TABLET | Freq: Every day | ORAL | Status: DC

## 2020-09-15 MED ORDER — DICLOFENAC SODIUM 1 % EX GEL
4.0000 g | Freq: Four times a day (QID) | CUTANEOUS | Status: DC | PRN
  Filled 2020-09-15: qty 100

## 2020-09-15 MED ORDER — MIRABEGRON ER 25 MG PO TB24
25.0000 mg | ORAL_TABLET | Freq: Every day | ORAL | Status: DC
  Administered 2020-09-16 – 2020-09-17 (×2): 25 mg via ORAL
  Filled 2020-09-15 (×2): qty 1

## 2020-09-15 NOTE — H&P (Signed)
History and Physical   [name] BJS:[id number] DOB: May 04, 1955 DOA: 09/15/2020  PCP: [name], [name]   Patient coming from: Home  Chief Complaint: Dizziness  HPI: [name] is a 65 y.o. male with medical history significant of CKD 3A, ataxia, hypertension, dizziness, hyperlipidemia, diabetes, GERD, depression, gout, prostate cancer in 2011 who presents with recurrent episodes of dizziness.  Patient was seen for similar symptoms and admitted in May.  Work-up there showed some mild narrowing of intracranial vessels but nothing to explain symptoms.  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  Recommendation at that time was to maintain good glycemic control, continue with B12 with aiming for levels of greater than 400, continue with aspirin and statin.  He was subsequently discharged to a rehab facility and he checked himself out because he did not feel he was getting better there.  He was doing okay until 4 to 5 days ago.  Now he has had 4 to 5 days of dizziness with standing or walking unable to stand or walk any significant distance.  At this time he is also reporting vision changes which are new, these have resolved at this time.  He also reports some scrotal discomfort as well which is new.  He reports significant stress due to the labile move of his "lady friend "which he states gets his heart racing and he feels may be contributing.  ED Course: Vital signs in the ED significant for heart rate in the 50s to 90s, blood pressure in the 607P to 710G systolic.  Lab work-up showed CMP with creatinine of 1.83 elevated from baseline of around 1.45, glucose 203.  CBC showed hemoglobin elevated to 18 possibly hemoconcentration up from baseline around 16 or so.  Troponin negative x2.  Urinalysis with only rare bacteria.  Chest x-ray showed no acute normality, scrotal ultrasound showed trace bilateral hydroceles, MR brain  showed no acute abnormality, MRA showed no large vessel occlusion nor flow-limiting stenosis, stable changes from previous admission.  Patient received 500 cc bolus and a dose of meclizine in the ED.  Review of Systems: As per HPI otherwise all other systems reviewed and are negative.  Past Medical History:  Diagnosis Date   Acute kidney injury superimposed on chronic kidney disease ([place]) 02/10/2020   Acute on chronic kidney failure ([place]) 09/28/2019   Closed nondisplaced spiral fracture of shaft of right tibia    Complicated urinary tract infection 09/06/2016   Depression    Diabetes mellitus without complication (HCC)    GERD (gastroesophageal reflux disease)    Gout    right foot   Hemorrhoids    Hypertension    Hypertension    Macular degeneration    Microcytic anemia    Prostate cancer ([place]) 08/2009   Rectal bleeding 07/31/2016   Tubular adenoma     Past Surgical History:  Procedure Laterality Date   CYSTOSCOPY WITH RETROGRADE PYELOGRAM, URETEROSCOPY AND STENT PLACEMENT Right 07/31/2016   Procedure: CYSTOSCOPY WITH RIGHT  RETROGRADE PYELOGRAM, URETEROSCOPY;  Surgeon: [name], MD;  Location: [facility];  Service: Urology;  Laterality: Right;   INGUINAL HERNIA REPAIR     Left ankle joint fusion  1981   PROSTATE BIOPSY     x 2   URETERAL REIMPLANTION  07/31/2016   Procedure: URETERAL REIMPLANT, right boari flap, right psoas hitch;  Surgeon: [name], MD;  Location: [facility];  Service: Urology;;  Social History  reports that he has never smoked. He has never used smokeless tobacco. He reports current alcohol use. He reports that he does not use drugs.  No Known Allergies  Family History  Problem Relation Age of Onset   Lung cancer Mother        Deceased   Throat cancer Brother    Pancreatic cancer Father    Heart disease Father        Deceased   Lung cancer Maternal Uncle        nephew   Diabetes Other   Reviewed on admission  Prior to Admission  medications   Medication Sig Start Date End Date Taking? Authorizing Provider  acetaminophen (TYLENOL) 325 MG tablet Take 2 tablets (650 mg total) by mouth every 6 (six) hours as needed for mild pain (or Fever >/= 101). 02/12/20   Domenic Polite, MD  aspirin EC 81 MG tablet Take 81 mg by mouth daily with breakfast.     [provider]  atorvastatin (LIPITOR) 20 MG tablet Take 1 tablet (20 mg total) by mouth daily. 03/13/20   [name], [name]  clotrimazole-betamethasone (LOTRISONE) cream Apply 1 application topically 2 (two) times daily. Patient taking differently: Apply 1 application topically 2 (two) times daily as needed (rash/itching). 07/17/20   [name], [name]  diclofenac Sodium (VOLTAREN) 1 % GEL Apply 4 g topically 4 (four) times daily. 09/03/20   Palumbo, April, MD  diphenhydrAMINE (BENADRYL) 25 MG tablet Take 25 mg by mouth every 6 (six) hours as needed for allergies.    [provider]  lidocaine (LIDODERM) 5 % Place 2 patches onto the skin daily. Remove & Discard patch within 12 hours or as directed by MD 09/03/20   Palumbo, April, MD  lidocaine (LIDODERM) 5 % Place 1 patch onto the skin daily. Remove & Discard patch within 12 hours or as directed by MD 09/03/20   Randal Buba, April, MD  metoprolol tartrate (LOPRESSOR) 50 MG tablet Take 1 tablet (50 mg total) by mouth 2 (two) times daily. 08/03/20   Sheikh, Omair Latif, DO  MYRBETRIQ 25 MG TB24 tablet TAKE 1 TABLET BY MOUTH EVERY DAY Patient taking differently: Take 25 mg by mouth daily. 03/29/20   [name], [name]  nystatin (NYSTATIN) powder Apply 1 application topically 3 (three) times daily. Patient taking differently: Apply 1 application topically 2 (two) times daily as needed (itching/rash). 08/26/19   [name], [name]  olmesartan-hydrochlorothiazide (BENICAR HCT) 40-25 MG tablet TAKE 1 TABLET BY MOUTH EVERY DAY 08/15/20   [name], [name]  Semaglutide (RYBELSUS) 14 MG TABS Take 1 tablet by mouth daily. 30 minutes  before breakfast Patient taking differently: Take 14 mg by mouth daily. 30 minutes before breakfast 07/17/20   [name], [name]  sildenafil (REVATIO) 20 MG tablet TAKE 1 TABLET BY MOUTH EVERY DAY (PRIOR AUTHORIZATION DENIED) Patient taking differently: Take 20 mg by mouth daily as needed (ed). 01/18/20   [name], [name]  Tetrahydrozoline HCl (VISINE OP) Place 1 drop into both eyes daily as needed (itching/irritation).    [provider]  traMADol (ULTRAM) 50 MG tablet Take 1 tablet (50 mg total) by mouth every 6 (six) hours as needed for severe pain. 08/03/20   Raiford Noble Latif, DO  traZODone (DESYREL) 50 MG tablet TAKE 1 TABLET (50 MG TOTAL) BY MOUTH AT BEDTIME AS NEEDED FOR SLEEP. 06/05/20   [name], [name]    Physical Exam: Vitals:   09/15/20 1745 09/15/20 1800 09/15/20 1815 09/15/20  1900  BP: 139/74 138/81 (!) 128/92 (!) 167/105  Pulse: 60 (!) 59 (!) 58 65  Resp: 17 12 16 16   Temp:      TempSrc:      SpO2: 98% 98% 98% 98%  Weight:      Height:       Physical Exam Constitutional:      General: He is not in acute distress.    Appearance: Normal appearance.  HENT:     Head: Normocephalic and atraumatic.     Mouth/Throat:     Mouth: Mucous membranes are moist.     Pharynx: Oropharynx is clear.  Eyes:     Extraocular Movements: Extraocular movements intact.     Pupils: Pupils are equal, round, and reactive to light.  Cardiovascular:     Rate and Rhythm: Normal rate and regular rhythm.     Pulses: Normal pulses.     Heart sounds: Normal heart sounds.  Pulmonary:     Effort: Pulmonary effort is normal. No respiratory distress.     Breath sounds: Normal breath sounds.  Abdominal:     General: Bowel sounds are normal. There is no distension.     Palpations: Abdomen is soft.     Tenderness: There is no abdominal tenderness.  Musculoskeletal:        General: No swelling or deformity.  Skin:    General: Skin is warm and dry.  Neurological:     Mental Status:  Mental status is at baseline.     Comments: Negative HINTS exam At times eyes appeared slow to track however when instructions repeated he was able to do so.   Labs on Admission: I have personally reviewed following labs and imaging studies  CBC: Recent Labs  Lab 09/15/20 1240  WBC 8.6  NEUTROABS 6.8  HGB 18.0*  HCT 54.2*  MCV 86.2  PLT 268    Basic Metabolic Panel: Recent Labs  Lab 09/15/20 1240  NA 136  K 4.8  CL 103  CO2 24  GLUCOSE 203*  BUN 29*  CREATININE 1.83*  CALCIUM 9.6    GFR: Estimated Creatinine Clearance: 39.1 mL/min (A) (by C-G formula based on SCr of 1.83 mg/dL (H)).  Liver Function Tests: Recent Labs  Lab 09/15/20 1240  AST 25  ALT 26  ALKPHOS 91  BILITOT 1.2  PROT 7.2  ALBUMIN 3.8    Urine analysis:    Component Value Date/Time   COLORURINE YELLOW 09/15/2020 1241   APPEARANCEUR CLEAR 09/15/2020 1241   LABSPEC 1.018 09/15/2020 1241   PHURINE 5.0 09/15/2020 1241   GLUCOSEU 150 (A) 09/15/2020 1241   HGBUR NEGATIVE 09/15/2020 1241   BILIRUBINUR NEGATIVE 09/15/2020 1241   BILIRUBINUR negative 08/26/2019 1516   KETONESUR NEGATIVE 09/15/2020 1241   PROTEINUR 100 (A) 09/15/2020 1241   UROBILINOGEN 0.2 08/26/2019 1516   UROBILINOGEN 0.2 04/18/2008 1153   NITRITE NEGATIVE 09/15/2020 1241   LEUKOCYTESUR NEGATIVE 09/15/2020 1241    Radiological Exams on Admission: MR ANGIO HEAD WO CONTRAST  Result Date: 09/15/2020 CLINICAL DATA:  Dizziness. EXAM: MRI HEAD WITHOUT CONTRAST MRA HEAD WITHOUT CONTRAST TECHNIQUE: Multiplanar, multi-echo pulse sequences of the brain and surrounding structures were acquired without intravenous contrast. Angiographic images of the Circle of Willis were acquired using MRA technique without intravenous contrast. COMPARISON:  Jul 31, 2020. FINDINGS: MRI HEAD FINDINGS Brain: No acute infarction, hemorrhage, hydrocephalus, extra-axial collection or mass lesion. Similar generalized cerebral volume loss with ex vacuo  ventricular dilation. Similar moderate patchy and confluent T2/FLAIR  hyperintensities within the white matter, nonspecific but most likely related to chronic microvascular ischemic disease given the patient's risk factors (including diabetes, CKD, and hypertension). 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 No significant mass effect. Vascular: See below. Skull and upper cervical spine: Normal marrow signal. Sinuses/Orbits: Mild ethmoid air cell mucosal thickening. Unremarkable orbits. Other: No mastoid effusions. MRA HEAD FINDINGS Anterior circulation: The visualized intracranial ICAs, MCAs, and ACAs are patent without flow limiting proximal stenosis. No aneurysm identified. No substantial change from the prior MRA. Posterior circulation: Bilateral visualized intradural vertebral arteries, basilar artery and posterior cerebral arteries are patent without evidence of flow-limiting proximal stenosis. No significant change from prior MRA. IMPRESSION: MRI: 1. No evidence of acute abnormality. Specifically, no acute infarct. 2. Similar moderate chronic microvascular ischemic disease and atrophy. MRA: No large vessel occlusion or proximal flow limiting stenosis. No significant change from the prior. Electronically Signed   By: Margaretha Sheffield MD   On: 09/15/2020 16:41   MR BRAIN WO CONTRAST  Result Date: 09/15/2020 CLINICAL DATA:  Dizziness. EXAM: MRI HEAD WITHOUT CONTRAST MRA HEAD WITHOUT CONTRAST TECHNIQUE: Multiplanar, multi-echo pulse sequences of the brain and surrounding structures were acquired without intravenous contrast. Angiographic images of the Circle of Willis were acquired using MRA technique without intravenous contrast. COMPARISON:  Jul 31, 2020. FINDINGS: MRI HEAD FINDINGS Brain: No acute infarction, hemorrhage, hydrocephalus, extra-axial collection or mass lesion. Similar generalized cerebral volume loss with ex vacuo ventricular  dilation. Similar moderate patchy and confluent T2/FLAIR hyperintensities within the white matter, nonspecific but most likely related to chronic microvascular ischemic disease given the patient's risk factors (including diabetes, CKD, and hypertension). 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 No significant mass effect. Vascular: See below. Skull and upper cervical spine: Normal marrow signal. Sinuses/Orbits: Mild ethmoid air cell mucosal thickening. Unremarkable orbits. Other: No mastoid effusions. MRA HEAD FINDINGS Anterior circulation: The visualized intracranial ICAs, MCAs, and ACAs are patent without flow limiting proximal stenosis. No aneurysm identified. No substantial change from the prior MRA. Posterior circulation: Bilateral visualized intradural vertebral arteries, basilar artery and posterior cerebral arteries are patent without evidence of flow-limiting proximal stenosis. No significant change from prior MRA. IMPRESSION: MRI: 1. No evidence of acute abnormality. Specifically, no acute infarct. 2. Similar moderate chronic microvascular ischemic disease and atrophy. MRA: No large vessel occlusion or proximal flow limiting stenosis. No significant change from the prior. Electronically Signed   By: Margaretha Sheffield MD   On: 09/15/2020 16:41   DG Chest Port 1 View  Result Date: 09/15/2020 CLINICAL DATA:  Dizziness. EXAM: PORTABLE CHEST 1 VIEW COMPARISON:  07/30/2020 FINDINGS: Mild scarring at the left lung base. Faint peripheral interstitial accentuation appears unchanged. Heart size within normal limits. No blunting of the costophrenic angles. IMPRESSION: 1. No acute findings. Faint peripheral interstitial accentuation in the lungs appears unchanged. Electronically Signed   By: Van Clines M.D.   On: 09/15/2020 13:21   US SCROTUM W/DOPPLER  Result Date: 09/15/2020 CLINICAL DATA:  Left testicular pain for the past month. EXAM:  SCROTAL ULTRASOUND DOPPLER ULTRASOUND OF THE TESTICLES TECHNIQUE: Complete ultrasound examination of the testicles, epididymis, and other scrotal structures was performed. Color and spectral Doppler ultrasound were also utilized to evaluate blood flow to the testicles. COMPARISON:  CT abdomen pelvis dated February 10, 2020. Scrotal ultrasound dated Jul 19, 2019. FINDINGS: Right testicle Measurements: 4.2 x 2.3 x 2.8 cm. No mass or  microlithiasis visualized. Left testicle Measurements: 4.2 x 3.2 x 3.4 cm. No mass or microlithiasis visualized. Right epididymis:  Normal in size and appearance. Left epididymis:  Normal in size and appearance. Hydrocele:  Trace bilateral hydroceles. Varicocele:  None visualized. Pulsed Doppler interrogation of both testes demonstrates normal low resistance arterial and venous waveforms bilaterally. IMPRESSION: 1. Trace bilateral hydroceles. Otherwise normal testicular ultrasound. Electronically Signed   By: Titus Dubin M.D.   On: 09/15/2020 14:23    EKG: Independently reviewed.  Sinus rhythm at 96 bpm.  Similar to previous.  Assessment/Plan Principal Problem:   Acute renal failure superimposed on stage 3a chronic kidney disease (HCC) Active Problems:   Benign hypertension   Dyslipidemia associated with type 2 diabetes mellitus (HCC)   Type 2 diabetes mellitus with diabetic nephropathy, without long-term current use of insulin (HCC)   Dizziness  AKI on CKD 3A > Creatinine elevated to 1.83 from baseline of around 1.45. > May be contributing to dizziness/vertigo as below - Received 500 cc bolus in ED, continue with IV fluids - Avoid nephrotoxic agents - Trend renal function and electrolytes  Dizziness > Patient with similar symptoms during admission in May, now returning for the past 4 to 5 days. > Significant dizziness on standing/walking.  > Previous work-up showed some mild narrowing of intracranial vessels but nothing to explain symptoms.  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  Recommendation at that time was to maintain good glycemic control, continue with B12 with aiming for levels of greater than 400, continue with aspirin and statin. > Dehydration may be contributing he did have AKI at last time as well. > He may benefit from additional stay at rehab as he did not complete his previous 1 > Also may benefit from new consult to neurology if symptoms are persistent again. > MRI today negative and MRA stable. > Has some significant stress at home regarding his "lady friend" that could be contributing. - Continue with IV fluids for dehydration - Continue with B12, check level - Maintain glucose control - Continue aspirin statin - We will hold home metoprolol given he had some episodes of bradycardia  Bilateral hydrocele > Noted some scrotal pain on presentation, found to have bilateral trace hydrocele on scrotal ultrasound. - Likely will not need any acute treatment - May benefit from urology outpatient  Hypertension - Continue home olmesartan-hydrochlorothiazide  Hyperlipidemia - Continue home atorvastatin  Diabetes - SSI   DVT prophylaxis: Lovenox  Code Status:   Full Family Communication:  None on admission Disposition Plan:   Patient is from:  Health  Anticipated DC to:  Home versus SNF  Anticipated DC date:  1 to 3 days  Anticipated DC barriers: None  Consults called:  None  Admission status:  Observation, telemetry   Severity of Illness: The appropriate patient status for this patient is OBSERVATION. Observation status is judged to be reasonable and necessary in order to provide the required intensity of service to ensure the patient's safety. The patient's presenting symptoms, physical exam findings, and initial radiographic and laboratory data in the context of their medical condition is felt to place them at decreased risk for further clinical  deterioration. Furthermore, it is anticipated that the patient will be medically stable for discharge from the hospital within 2 midnights of admission. The following factors support the patient status of observation.   " The patient's presenting symptoms include dizziness, scrotal pain. "  The physical exam findings include stable physical exam, sometimes eyes appeared slow to track.. " The initial radiographic and laboratory data are creatinine 1.832 from baseline of 1.45, hemoglobin 18, trace bilateral hydroceles and scrotal ultrasound.Marcelyn Bruins MD [facility]  How to contact the [facility] Attending or Consulting provider [place] or covering provider during after hours [place], for this patient?   Check the care team in [facility] and look for a) attending/consulting [facility] provider listed and b) the [facility] team listed Log into [email or web address] and use [facility] universal password to access. If you do not have the password, please contact the hospital operator. Locate the [facility] provider you are looking for under [facility] and page to a number that you can be directly reached. If you still have difficulty reaching the provider, please page the [facility] (Director on Call) for the Hospitalists listed on amion for assistance.  09/15/2020, 7:49 PM

## 2020-09-15 NOTE — ED Notes (Signed)
Charted in error (other flowsheet documentation)

## 2020-09-15 NOTE — ED Notes (Signed)
Patient is in MRI.  

## 2020-09-15 NOTE — ED Provider Notes (Signed)
[facility]  EMERGENCY DEPARTMENT Provider Note   CSN: [id number] Arrival date & time: 09/15/20  1220     History Chief Complaint  Patient presents with   Hyperglycemia   Dizziness   Tachycardia   Testicle Pain    Left sided     [name] is a 65 y.o. male.  Patient is a 65 year old male with a history of diabetes, CKD, chronic hydroureteronephrosis, status post prostate cancer, hypertension who is presenting today complaining of dizziness, difficulty walking and falls.  Patient was recently seen in the hospital 07/28/2020 for similar events.  At that time patient was found to have some narrowing in the vessels of his brain but no evidence of an acute stroke.  They thought some of his balance issues were related to his ongoing poorly controlled diabetes.  Patient states he has been taking his medications at home and has not missed any doses.  However over the last 4 to 5 days he started having symptoms again.  He initially was discharged to a rehab facility but he checked himself out because he did not feel like he was getting any better there.  He has been home since that time.  Over the last few days he has not noticed with position changes he has felt very dizzy and today he was having so much difficulty walking he then fell down to his knee and someone had to help him.  He states everything in his vision is moving.  It is worse when he tries to walk.  He denies any headache or neck pain.  No chest pain or shortness of breath.  Also he is noticed over the last 1 week he has had pain in his left testicle and inner groin area.  He is felt like there was a small knot there but has denied any urinary symptoms.  He has not had abdominal pain, fever, vomiting or diarrhea.  EMS reported today that patient was tachycardic, hypertensive and had a blood sugar in the 200s.  He did not receive any medications prior to arrival.  He still complains of the same symptoms upon arrival  here.  The history is provided by the patient.  Hyperglycemia Associated symptoms: dizziness   Dizziness Testicle Pain      Past Medical History:  Diagnosis Date   Closed nondisplaced spiral fracture of shaft of right tibia    Complicated urinary tract infection 09/06/2016   Depression    Diabetes mellitus without complication (HCC)    GERD (gastroesophageal reflux disease)    Gout    right foot   Hemorrhoids    Hypertension    Hypertension    Macular degeneration    Microcytic anemia    Prostate cancer ([place]) 08/2009   Rectal bleeding 07/31/2016   Tubular adenoma     Patient Active Problem List   Diagnosis Date Noted   Ataxia 08/01/2020   Dizziness 07/31/2020   Chest pain 07/31/2020   Hip pain 07/31/2020   AKI (acute kidney injury) ([place]) 02/11/2020   Acute kidney injury superimposed on chronic kidney disease ([place]) 02/10/2020   Hydroureteronephrosis 02/10/2020   Nausea and vomiting 02/10/2020   History of COVID-19 02/10/2020   Pneumonia due to COVID-19 virus 09/29/2019   Acute on chronic kidney failure ([place]) 09/28/2019   Hyponatremia 09/28/2019   Sinus tachycardia 09/28/2019   Insomnia 08/26/2018   Nephropathy 03/26/2018   Urinary urgency 03/26/2018   Type 2 diabetes mellitus with diabetic nephropathy, without long-term current  use of insulin ([place]) 03/26/2018   Essential hypertension 03/26/2018   Chronic kidney disease (CKD), stage III (moderate) ([place]) 03/26/2018   Conductive hearing loss, bilateral 11/07/2017   Uncontrolled type 2 diabetes mellitus with hyperglycemia ([place]) 07/31/2016   Dyslipidemia associated with type 2 diabetes mellitus ([place]) 05/29/2016   Microcytic anemia 02/17/2012   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   Benign hypertension 02/17/2012   Depressive disorder 02/17/2012    Past Surgical History:  Procedure Laterality Date   CYSTOSCOPY WITH RETROGRADE PYELOGRAM, URETEROSCOPY AND STENT PLACEMENT Right 07/31/2016   Procedure: CYSTOSCOPY WITH  RIGHT  RETROGRADE PYELOGRAM, URETEROSCOPY;  Surgeon: [name], MD;  Location: [facility];  Service: Urology;  Laterality: Right;   INGUINAL HERNIA REPAIR     Left ankle joint fusion  1981   PROSTATE BIOPSY     x 2   URETERAL REIMPLANTION  07/31/2016   Procedure: URETERAL REIMPLANT, right boari flap, right psoas hitch;  Surgeon: [name], MD;  Location: [facility];  Service: Urology;;       Family History  Problem Relation Age of Onset   Lung cancer Mother        Deceased   Throat cancer Brother    Pancreatic cancer Father    Heart disease Father        Deceased   Lung cancer Maternal Uncle        nephew   Diabetes Other     Social History   Tobacco Use   Smoking status: Never   Smokeless tobacco: Never  Vaping Use   Vaping Use: Never used  Substance Use Topics   Alcohol use: Yes    Comment: ocassionally   Drug use: No    Home Medications Prior to Admission medications   Medication Sig Start Date End Date Taking? Authorizing Provider  acetaminophen (TYLENOL) 325 MG tablet Take 2 tablets (650 mg total) by mouth every 6 (six) hours as needed for mild pain (or Fever >/= 101). 02/12/20   Domenic Polite, MD  aspirin EC 81 MG tablet Take 81 mg by mouth daily with breakfast.     [provider]  atorvastatin (LIPITOR) 20 MG tablet Take 1 tablet (20 mg total) by mouth daily. 03/13/20   [name], [name]  clotrimazole-betamethasone (LOTRISONE) cream Apply 1 application topically 2 (two) times daily. Patient taking differently: Apply 1 application topically 2 (two) times daily as needed (rash/itching). 07/17/20   [name], [name]  diclofenac Sodium (VOLTAREN) 1 % GEL Apply 4 g topically 4 (four) times daily. 09/03/20   Palumbo, April, MD  diphenhydrAMINE (BENADRYL) 25 MG tablet Take 25 mg by mouth every 6 (six) hours as needed for allergies.    [provider]  lidocaine (LIDODERM) 5 % Place 2 patches onto the skin daily. Remove & Discard patch  within 12 hours or as directed by MD 09/03/20   Palumbo, April, MD  lidocaine (LIDODERM) 5 % Place 1 patch onto the skin daily. Remove & Discard patch within 12 hours or as directed by MD 09/03/20   Randal Buba, April, MD  metoprolol tartrate (LOPRESSOR) 50 MG tablet Take 1 tablet (50 mg total) by mouth 2 (two) times daily. 08/03/20   Sheikh, Omair Latif, DO  MYRBETRIQ 25 MG TB24 tablet TAKE 1 TABLET BY MOUTH EVERY DAY Patient taking differently: Take 25 mg by mouth daily. 03/29/20   [name], [name]  nystatin (NYSTATIN) powder Apply 1 application topically 3 (three) times daily. Patient taking differently: Apply 1 application topically  2 (two) times daily as needed (itching/rash). 08/26/19   [name], [name]  olmesartan-hydrochlorothiazide (BENICAR HCT) 40-25 MG tablet TAKE 1 TABLET BY MOUTH EVERY DAY 08/15/20   [name], [name]  Semaglutide (RYBELSUS) 14 MG TABS Take 1 tablet by mouth daily. 30 minutes before breakfast Patient taking differently: Take 14 mg by mouth daily. 30 minutes before breakfast 07/17/20   [name], [name]  sildenafil (REVATIO) 20 MG tablet TAKE 1 TABLET BY MOUTH EVERY DAY (PRIOR AUTHORIZATION DENIED) Patient taking differently: Take 20 mg by mouth daily as needed (ed). 01/18/20   [name], [name]  Tetrahydrozoline HCl (VISINE OP) Place 1 drop into both eyes daily as needed (itching/irritation).    [provider]  traMADol (ULTRAM) 50 MG tablet Take 1 tablet (50 mg total) by mouth every 6 (six) hours as needed for severe pain. 08/03/20   Raiford Noble Latif, DO  traZODone (DESYREL) 50 MG tablet TAKE 1 TABLET (50 MG TOTAL) BY MOUTH AT BEDTIME AS NEEDED FOR SLEEP. 06/05/20   [name], [name]    Allergies    Patient has no known allergies.  Review of Systems   Review of Systems  Genitourinary:  Positive for testicular pain.  Neurological:  Positive for dizziness.  All other systems reviewed and are negative.  Physical Exam Updated Vital Signs BP (!) 151/102 (BP  Location: Right Arm)   Pulse 90   Temp (!) 97.5 F (36.4 C) (Oral)   Resp 18   Ht 5\' 5"  (1.651 m)   Wt 64.4 kg   SpO2 98%   BMI 23.63 kg/m   Physical Exam Vitals and nursing note reviewed. Exam conducted with a chaperone present.  Constitutional:      General: He is not in acute distress.    Appearance: Normal appearance. He is well-developed. He is obese.  HENT:     Head: Normocephalic and atraumatic.     Mouth/Throat:     Mouth: Mucous membranes are moist.  Eyes:     General: Visual field deficit present.     Extraocular Movements: Extraocular movements intact.     Conjunctiva/sclera: Conjunctivae normal.     Pupils: Pupils are equal, round, and reactive to light.     Comments: No nystagmus  Cardiovascular:     Rate and Rhythm: Normal rate and regular rhythm.     Heart sounds: No murmur heard. Pulmonary:     Effort: Pulmonary effort is normal. No respiratory distress.     Breath sounds: Normal breath sounds. No wheezing or rales.  Abdominal:     General: There is no distension.     Palpations: Abdomen is soft.     Tenderness: There is no abdominal tenderness. There is no guarding or rebound.  Genitourinary:    Testes:        Left: Tenderness present. Mass or swelling not present.     Epididymis:     Left: Inflamed. Tenderness present. No mass.  Musculoskeletal:        General: No tenderness. Normal range of motion.     Cervical back: Normal range of motion and neck supple.  Skin:    General: Skin is warm and dry.     Findings: No erythema or rash.  Neurological:     Mental Status: He is alert and oriented to person, place, and time.     Cranial Nerves: No dysarthria or facial asymmetry.     Sensory: Sensation is intact.     Motor: Weakness present.  Comments: 4/5 weakness in bilateral lower ext.  Visual field cut in the right upper outer field.  Difficulty with finger to nose with both hands.  Due to bilateral leg weakness cannot perform heel to shin testing.   Psychiatric:        Mood and Affect: Mood normal.        Behavior: Behavior normal.    ED Results / Procedures / Treatments   Labs (all labs ordered are listed, but only abnormal results are displayed) Labs Reviewed  CBC WITH DIFFERENTIAL/PLATELET  COMPREHENSIVE METABOLIC PANEL  URINALYSIS, ROUTINE W REFLEX MICROSCOPIC  TROPONIN I (HIGH SENSITIVITY)    EKG None  Radiology No results found.  Procedures Procedures   Medications Ordered in ED Medications  meclizine (ANTIVERT) tablet 25 mg (has no administration in time range)    ED Course  I have reviewed the triage vital signs and the nursing notes.  Pertinent labs & imaging results that were available during my care of the patient were reviewed by me and considered in my medical decision making (see chart for details).    MDM Rules/Calculators/A&P                         65 year old male with multiple medical problems presenting today with recurrent dizziness, difficulty ambulating, neurodeficits on exam.  Patient had similar symptoms in May and at that time had an MRI that was negative for stroke but did show a 40% vessel occlusion.  Did not feel that that was necessarily the cause of his symptoms and thought some of it may be related to his diabetes.  Possibly peripheral vertigo also as he reports his symptoms have returned in the last 4 to 5 days.  However given patient's abnormal neuro exam, known risk factors will repeat MRI/MRA.  Also patient moderately hyperglycemic today in the 200s.  He is otherwise well-appearing.  He is hypertensive but does report taking his medications.  Denies any acute abdominal or chest symptoms but has had new left-sided testicular pain.  There is no evidence of Fournier's or acute cellulitis but will do an ultrasound to ensure no signs of epididymitis.  Patient given meclizine for symptoms.  Otherwise labs and imaging are pending.  Final Clinical Impression(s) / ED Diagnoses Final diagnoses:   None    Rx / DC Orders ED Discharge Orders     None        Blanchie Dessert, MD 09/15/20 1526

## 2020-09-15 NOTE — ED Provider Notes (Signed)
  Physical Exam  BP (!) 128/92   Pulse (!) 58   Temp (!) 97.5 F (36.4 C) (Oral)   Resp 16   Ht 5\' 6"  (1.676 m)   Wt 78.5 kg   SpO2 98%   BMI 27.92 kg/m   Physical Exam  ED Course/Procedures     Procedures  MDM  Patient presented again with vertigo.  Unsteadiness.  Also reportedly had some tachycardia.  Reportedly also testicle pain.  Had been in rehab after visit to the hospital over a month ago.  Back at home and not doing well.  Now has visual field cut on his right upper vision and still off on finger-nose on left and right but particularly right.  MRI done reassuring.  Lab work shows mildly elevated creatinine.  Maybe there is a dehydration component to this.  However still after treatment still feeling too dizzy to ambulate.  Feels the patient would benefit from mission to the hospital for hydration and further work-up and potentially placement again.       Davonna Belling, MD 09/15/20 [phone]

## 2020-09-15 NOTE — ED Triage Notes (Signed)
Patient called EMS from home c/o dizziness on standing. EMS blood glucose was 220. States that he feels like he drank half a bottle of liquor, he had that feeling for about 4-5 days.

## 2020-09-15 NOTE — ED Notes (Signed)
Acuity place in error being triaged by another RN

## 2020-09-15 NOTE — Chronic Care Management (AMB) (Signed)
  Care Management    Consult Note  09/15/2020 Name: ZYKEEM BAUSERMAN MRN: [id number] DOB: November 08, 1955  Care management team received notification of patient's current emergency department visit related to  Dizziness, Tachycardia, Testicle Pain, and Hyperglycemia  .Based on review of health record, DREUX MCGROARTY is currently active in the embedded care coordination program..   Review of patient status, including review of consultants reports, relevant laboratory and other test results, and collaboration with appropriate care team members and the patient's provider was performed as part of comprehensive patient evaluation and provision of chronic care management services.    SW reviewed chart to note patient has presented to the ED three times in the past three months with similar complaints of dizziness. Today's ED note indicates patient reports he feels like he drank a fifth of liquor even though he did not. SW has received similar reports from the patient in the past several months. Collaboration with [place] to review patients complaints as well as discuss his current ED medication Sildenafil. Medication literature indicates rare side effects include dizziness especially when getting up suddenly, behavior change similar to drunkenness, and vision changes.   SW collaboration with patients primary provider [name] [name] as well as [facility] Liaison to notify of patient current disposition. SW also communicated possibility of medication side effect.  Plan:  The Care Management team will continue to follow for discharge needs.  [name], BSW, CDP Social Worker, Certified Dementia Practitioner [place] [facility] [place] [facility] [phone]

## 2020-09-16 DIAGNOSIS — R7989 Other specified abnormal findings of blood chemistry: Secondary | ICD-10-CM | POA: Diagnosis not present

## 2020-09-16 DIAGNOSIS — F32A Depression, unspecified: Secondary | ICD-10-CM

## 2020-09-16 DIAGNOSIS — F419 Anxiety disorder, unspecified: Secondary | ICD-10-CM

## 2020-09-16 DIAGNOSIS — R2681 Unsteadiness on feet: Secondary | ICD-10-CM | POA: Diagnosis not present

## 2020-09-16 DIAGNOSIS — E785 Hyperlipidemia, unspecified: Secondary | ICD-10-CM | POA: Diagnosis not present

## 2020-09-16 DIAGNOSIS — E1121 Type 2 diabetes mellitus with diabetic nephropathy: Secondary | ICD-10-CM | POA: Diagnosis not present

## 2020-09-16 DIAGNOSIS — R42 Dizziness and giddiness: Secondary | ICD-10-CM | POA: Diagnosis not present

## 2020-09-16 DIAGNOSIS — N1831 Chronic kidney disease, stage 3a: Secondary | ICD-10-CM | POA: Diagnosis not present

## 2020-09-16 DIAGNOSIS — E1169 Type 2 diabetes mellitus with other specified complication: Secondary | ICD-10-CM | POA: Diagnosis not present

## 2020-09-16 DIAGNOSIS — I10 Essential (primary) hypertension: Secondary | ICD-10-CM | POA: Diagnosis not present

## 2020-09-16 DIAGNOSIS — N179 Acute kidney failure, unspecified: Secondary | ICD-10-CM | POA: Diagnosis not present

## 2020-09-16 LAB — GLUCOSE, CAPILLARY
Glucose-Capillary: 190 mg/dL — ABNORMAL HIGH (ref 70–99)
Glucose-Capillary: 152 mg/dL — ABNORMAL HIGH (ref 70–99)
Glucose-Capillary: 166 mg/dL — ABNORMAL HIGH (ref 70–99)
Glucose-Capillary: 194 mg/dL — ABNORMAL HIGH (ref 70–99)

## 2020-09-16 LAB — COMPREHENSIVE METABOLIC PANEL
ALT: 19 U/L (ref 0–44)
AST: 17 U/L (ref 15–41)
Albumin: 3.3 g/dL — ABNORMAL LOW (ref 3.5–5.0)
Alkaline Phosphatase: 82 U/L (ref 38–126)
Anion gap: 10 (ref 5–15)
BUN: 25 mg/dL — ABNORMAL HIGH (ref 8–23)
CO2: 25 mmol/L (ref 22–32)
Calcium: 9.3 mg/dL (ref 8.9–10.3)
Chloride: 103 mmol/L (ref 98–111)
Creatinine, Ser: 1.59 mg/dL — ABNORMAL HIGH (ref 0.61–1.24)
GFR, Estimated: 48 mL/min — ABNORMAL LOW (ref >60)
Glucose, Bld: 121 mg/dL — ABNORMAL HIGH (ref 70–99)
Potassium: 3.8 mmol/L (ref 3.5–5.1)
Sodium: 138 mmol/L (ref 135–145)
Total Bilirubin: 1.2 mg/dL (ref 0.3–1.2)
Total Protein: 6.2 g/dL — ABNORMAL LOW (ref 6.5–8.1)

## 2020-09-16 LAB — CBC
HCT: 53.5 % — ABNORMAL HIGH (ref 39.0–52.0)
Hemoglobin: 17.7 g/dL — ABNORMAL HIGH (ref 13.0–17.0)
MCH: 28.6 pg (ref 26.0–34.0)
MCHC: 33.1 g/dL (ref 30.0–36.0)
MCV: 86.4 fL (ref 80.0–100.0)
Platelets: 183 10*3/uL (ref 150–400)
RBC: 6.19 MIL/uL — ABNORMAL HIGH (ref 4.22–5.81)
RDW: 13.3 % (ref 11.5–15.5)
WBC: 6.6 10*3/uL (ref 4.0–10.5)
nRBC: 0 % (ref 0.0–0.2)

## 2020-09-16 LAB — SARS CORONAVIRUS 2 (TAT 6-24 HRS): SARS Coronavirus 2: NEGATIVE

## 2020-09-16 LAB — VITAMIN B12: Vitamin B-12: 416 pg/mL (ref 180–914)

## 2020-09-16 MED ORDER — SODIUM CHLORIDE 0.9 % IV SOLN: INTRAVENOUS | Status: AC

## 2020-09-16 MED ORDER — OLMESARTAN MEDOXOMIL-HCTZ 40-25 MG PO TABS: 1.0000 | ORAL_TABLET | Freq: Every day | ORAL | 1 refills | Status: DC

## 2020-09-16 MED ORDER — CARVEDILOL 6.25 MG PO TABS
6.2500 mg | ORAL_TABLET | Freq: Two times a day (BID) | ORAL | Status: DC
  Administered 2020-09-16 – 2020-09-17 (×2): 6.25 mg via ORAL
  Filled 2020-09-16 (×2): qty 1

## 2020-09-16 NOTE — Evaluation (Addendum)
Occupational Therapy Evaluation Patient Details Name: [name] MRN: [id number] DOB: Apr 18, 1955 Today's Date: 09/16/2020    History of Present Illness 65 y/o male presented to ED on 7/1 with complaints of dizziness on standing, difficulty walking, and falls. Patient recently seen 07/28/20 for similar events and d/c'ed to rehab facility, however checked himself out. MRI showed no acute abnormality. Workup for dizziness still pending. PMH: HTN, prostate cancer, CKD, DM, gout, and macular degeneration.   Clinical Impression   Patient admitted for the diagnosis above.  He did have a LOB with PT earlier in the day, but appears to be doing a little better this afternoon.  He states he feels better, and is not as "aggravated" like he was earlier.  States he was able to talk to his lady friend, and she is not mad at him anymore.  Beyond this, he does present with assumed baseline slowed mentation and safety deficits, but is able to walk in his room without assist, and needs no assist with ADL completion.  At home he does not ascend stairs, and is able to sponge bathe without assist.  He is probably at his baseline.  When OT discussed SNF for post acute rehab, he declined, and stated he did not need any rehab.  His plan is to return home, and given the supports he has, and the fact he does not drive, this is reasonable.  The patient did not experience any dizziness with mobility.  No real OT needs identified in the acute setting.  Recommend continued mobility in the halls with staff.      Follow Up Recommendations  No OT follow up    Equipment Recommendations  Other (comment) (patient declines post acute rehab)    Recommendations for Other Services       Precautions / Restrictions Precautions Precautions: Fall Restrictions Weight Bearing Restrictions: No      Mobility Bed Mobility Overal bed mobility: Independent               Patient Response: Flat affect  Transfers Overall transfer  level: Modified independent   Transfers: Sit to/from Stand;Stand Pivot Transfers Sit to Stand: Modified independent (Device/Increase time) Stand pivot transfers: Modified independent (Device/Increase time)       General transfer comment: patient able to push IV in room without any LOB noted.    Balance Overall balance assessment: Mild deficits observed, not formally tested                                         ADL either performed or assessed with clinical judgement   ADL Overall ADL's : At baseline                                       General ADL Comments: Patient does not need any assist for bathing and dressing.  He requires increased time, but no assist.     Vision Patient Visual Report: No change from baseline       Perception  [facility]   Praxis   Intact   Pertinent Vitals/Pain Pain Assessment: No/denies pain     Hand Dominance Right   Extremity/Trunk Assessment Upper Extremity Assessment Upper Extremity Assessment: Overall WFL for tasks assessed   Lower Extremity Assessment Lower Extremity Assessment: Defer to PT evaluation   Cervical /  Trunk Assessment Cervical / Trunk Assessment: Normal   Communication Communication Communication: No difficulties   Cognition Arousal/Alertness: Awake/alert Behavior During Therapy: Flat affect Overall Cognitive Status: No family/caregiver present to determine baseline cognitive functioning                     Current Attention Level: Sustained   Following Commands: Follows multi-step commands with increased time Safety/Judgement: Decreased awareness of safety;Decreased awareness of deficits Awareness: Intellectual Problem Solving: Slow processing;Requires verbal cues;Difficulty sequencing                      Home Living Family/patient expects to be discharged to:: Private residence Living Arrangements: Alone Available Help at Discharge: Friend(s);Available  PRN/intermittently Type of Home: Apartment Home Access: Stairs to enter Entrance Stairs-Number of Steps: 1 Entrance Stairs-Rails: Right;Left Home Layout: Two level;Able to live on main level with bedroom/bathroom;1/2 bath on main level     Bathroom Shower/Tub: Occupational psychologist: Standard Bathroom Accessibility: Yes How Accessible: Accessible via walker Home Equipment: Shower seat;Walker - 2 wheels;Bedside commode;Grab bars - tub/shower;Grab bars - toilet          Prior Functioning/Environment    Gait / Transfers Assistance Needed: Walks without AD ADL's / Homemaking Assistance Needed: Patient states he sponge bathes on 1st floor in 1/2 bath. has assist with community mobility via "lady friend", but he also takes the bus.            OT Problem List: Impaired balance (sitting and/or standing)      OT Treatment/Interventions:      OT Goals(Current goals can be found in the care plan section) Acute Rehab OT Goals Patient Stated Goal: to go home tomorrow OT Goal Formulation: With patient Time For Goal Achievement: 09/16/20 Potential to Achieve Goals: Fair  OT Frequency:     Barriers to D/C:  None noted          Co-evaluation              AM-PAC OT "6 Clicks" Daily Activity     Outcome Measure Help from another person eating meals?: None Help from another person taking care of personal grooming?: None Help from another person toileting, which includes using toliet, bedpan, or urinal?: None Help from another person bathing (including washing, rinsing, drying)?: None Help from another person to put on and taking off regular upper body clothing?: None Help from another person to put on and taking off regular lower body clothing?: None 6 Click Score: 24   End of Session Equipment Utilized During Treatment: Other (comment) (IV pole)  Activity Tolerance: Patient tolerated treatment well Patient left: in bed;with call bell/phone within reach  OT  Visit Diagnosis: Unsteadiness on feet (R26.81)                Time: 1500-1520 OT Time Calculation (min): 20 min Charges:  OT General Charges $OT Visit: 1 Visit OT Evaluation $OT Eval Moderate Complexity: 1 Mod  09/16/2020  Rich, OTR/L  Acute Rehabilitation Services  Office:  [phone]   Metta Clines 09/16/2020, 3:28 PM

## 2020-09-16 NOTE — TOC Initial Note (Signed)
Transition of Care [facility]) - Initial/Assessment Note    Patient Details  Name: [name] MRN: [id number] Date of Birth: [birth year]  Transition of Care [facility]) CM/SW Contact:    Bartholomew Crews, RN Phone Number: [phone] 09/16/2020, 5:45 PM  Clinical Narrative:                  Spoke with patient at the bedside. PTA home alone. Has significant other who lives near and friends - all check on him and assist as needed. Has RW if needed. Agreeable to [facility] RN, PT - referral pending with [place]. TOC following for transition needs.   Expected Discharge Plan: [place] Barriers to Discharge: Continued Medical Work up   Patient Goals and CMS Choice Patient states their goals for this hospitalization and ongoing recovery are:: return home CMS Medicare.gov Compare Post Acute Care list provided to:: Patient Choice offered to / list presented to : Patient  Expected Discharge Plan and Services Expected Discharge Plan: [place] In-house Referral: NA Discharge Planning Services: CM Consult Post Acute Care Choice: [place] arrangements for the past 2 months: Apartment Expected Discharge Date: 09/16/20               DME Arranged: N/A DME Agency: NA       HH Arranged: PT, RN          Prior Living Arrangements/Services Living arrangements for the past 2 months: Apartment Lives with:: Self Patient language and need for interpreter reviewed:: Yes        Need for Family Participation in Patient Care: Yes (Comment) Care giver support system in place?: Yes (comment) Current home services: DME (RW) Criminal Activity/Legal Involvement Pertinent to Current Situation/Hospitalization: No - Comment as needed  Activities of Daily Living      Permission Sought/Granted                  Emotional Assessment Appearance:: Appears stated age Attitude/Demeanor/Rapport: Engaged Affect (typically observed): Accepting Orientation: :  Oriented to Self, Oriented to  Time, Oriented to Place, Oriented to Situation Alcohol / Substance Use: Not Applicable Psych Involvement: No (comment)  Admission diagnosis:  Acute renal failure superimposed on stage 3a chronic kidney disease ([place]) [N17.9, N18.31] Patient Active Problem List   Diagnosis Date Noted   Acute renal failure superimposed on stage 3a chronic kidney disease ([place]) 09/15/2020   Ataxia 08/01/2020   Dizziness 07/31/2020   Chest pain 07/31/2020   Hip pain 07/31/2020   AKI (acute kidney injury) ([place]) 02/11/2020   Hydroureteronephrosis 02/10/2020   Nausea and vomiting 02/10/2020   History of COVID-19 02/10/2020   Pneumonia due to COVID-19 virus 09/29/2019   Hyponatremia 09/28/2019   Sinus tachycardia 09/28/2019   Insomnia 08/26/2018   Nephropathy 03/26/2018   Urinary urgency 03/26/2018   Type 2 diabetes mellitus with diabetic nephropathy, without long-term current use of insulin ([place]) 03/26/2018   Essential hypertension 03/26/2018   Chronic kidney disease (CKD), stage III (moderate) ([place]) 03/26/2018   Conductive hearing loss, bilateral 11/07/2017   Uncontrolled type 2 diabetes mellitus with hyperglycemia ([place]) 07/31/2016   Dyslipidemia associated with type 2 diabetes mellitus ([place]) 05/29/2016   Microcytic anemia 02/17/2012   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

## 2020-09-16 NOTE — Progress Notes (Signed)
Patient is sitting on the side of the bed. Alarm started to go off. Patient becomes agitated and mad, ordering [place] to cut it off. Refused to turned it back on. Educate pt the importance and safety of alarm on. [place] to turned it off. Will continue to monitor patient.

## 2020-09-16 NOTE — Progress Notes (Signed)
PROGRESS NOTE  [name] ENI:[id number] DOB: 22-Nov-1955   PCP: [name], [name]  Patient is from: Home. Lives alone  DOA: 09/15/2020 LOS: 0  Chief complaints:  Chief Complaint  Patient presents with   Dizziness   Tachycardia   Testicle Pain    Left sided    Hyperglycemia     Brief Narrative / Interim history: 65 year old M with PMH of CKD-3A, ataxia, dizziness, HTN, HLD, DM-2, anxiety, depression, GERD and prostate cancer presenting with recurrent episodes of "dizziness".   Hospitalized for the same in May 2022.  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  He was discharged to SNF but checked himself out because he did not notice significant improvement.  In ED, work-up revealed mild AKI with creatinine to 1.83.  Hgb 18.  Troponin, UA, CXR, MRI brain and MRA head without significant finding.  Received IV bolus, meclizine and admitted for "dizziness" and AKI.  See individual problem list below for more on hospital course.  Subjective: Seen and examined earlier this morning.  Patient was not compliant with bed alarm and fall precaution.  He was standing by the door holding to the drip stand.  He continues to endorse "dizziness" that he describes as feeling like he is drunk.  He reports drinking 2 beers occasionally.  He denies vertigo.  His symptoms are worse when he bends over.  Not alert with standing from sitting.  He denies numbness or tingling, headache, acute vision change, chest pain, palpitation, shortness of breath, GI or UTI symptoms.  He denies recent fall.  Objective: Vitals:   09/16/20 1138 09/16/20 1139 09/16/20 1143 09/16/20 1145  BP: 133/80 (!) 146/78 (!) 139/94 (!) 137/91  Pulse: (!) 59 67 68 68  Resp: 18 17 18 20   Temp:      TempSrc:      SpO2: 99% 97% 91% 99%  Weight:      Height:        Intake/Output Summary (Last 24 hours) at 09/16/2020 1531 Last data filed at 09/16/2020 1300 Gross per 24 hour   Intake 2845.11 ml  Output 550 ml  Net 2295.11 ml   Filed Weights   09/15/20 1231 09/15/20 1232 09/15/20 1301  Weight: 64.4 kg 64.4 kg 78.5 kg    Examination:  GENERAL: No apparent distress.  Nontoxic. HEENT: MMM.  Vision and hearing grossly intact.  NECK: Supple.  No apparent JVD.  RESP: On RA.  No IWOB.  Fair aeration bilaterally. CVS:  RRR. Heart sounds normal.  ABD/GI/GU: BS+. Abd soft, NTND.  MSK/EXT:  Moves extremities. No apparent deformity. No edema.  SKIN: no apparent skin lesion or wound NEURO: Awake, alert and oriented appropriately.  Cranial, motor, sensory and reflexes intact.  Finger-to-nose is slightly off target.  No pronator drift. PSYCH: Calm. Normal affect.   Procedures:  None  Microbiology summarized: COVID-19 PCR negative  Assessment & Plan: "Dizziness"/history of ataxia: He describes his dizziness as feeling drunk.  Denies vertigo or cardiopulmonary symptoms.  Worse when he bends down.  Hospitalized and evaluated by neurology in May 2022.  There was concern about sensory neuropathy due to his uncontrolled diabetes after extensive work-up.  Work-up including B12, MRI brain and MRA head unrevealing.  Seems to have polycythemia.  No history of COPD or OSA.  Also uses Benadryl and sildenafil but denies taking those medications in the last 1 to 2 weeks.  Finger-to-nose is slightly off.  Otherwise, neuro  exam is intact.  Orthostatic vitals negative.  There could be some element of dehydration as evidenced by AKI and polycythemia. -Optimize diabetic control -Last hospitalization, neurology recommended keeping B12 above 400 -Discontinued Benadryl-he reports using this for allergies. -Continue IV NS at 100 cc an hour -Continue aspirin and statin -Continue fall precautions-he has not been compliant with this. -PT/OT eval  AKI on CKD-3A/azotemia: Improved. Recent Labs    12/13/19 1513 02/10/20 0912 02/11/20 0133 02/12/20 0746 03/13/20 1139 07/17/20 1450  07/30/20 1829 08/01/20 0245 09/15/20 1240 09/16/20 0403  BUN 33* 41* 36* 23 35* 24 27* 26* 29* 25*  CREATININE 1.41* 2.20* 1.75* 1.41* 1.70* 1.60* 1.77* 1.48* 1.83* 1.59*  -Continue IV NS at 100 cc an hour -Discontinue Benicar and HCTZ -Recheck renal function in the morning.    Bilateral hydrocele: Pain seems to have resolved. -Monitor   Hypertension: BP within acceptable range. -Hold Benicar and HCTZ in the setting of AKI -Low-dose Coreg instead of metoprolol  Hyperlipidemia -Continue home atorvastatin  Uncontrolled NIDDM-2 with peripheral neuropathy, CKD-3A and hyperglycemia: A1c 8.1% on 5/2.  On semaglutide at home.  Recent Labs  Lab 09/15/20 2104 09/16/20 0634 09/16/20 1148  GLUCAP 235* 190* 152*  -Continue SSI-moderate -Continue home statin.  Anxiety and depression: Seems to be somewhat anxious.  Takes trazodone at home -Continue home trazodone.  Body mass index is 27.92 kg/m.         DVT prophylaxis:  enoxaparin (LOVENOX) injection 40 mg Start: 09/15/20 2100  Code Status: Full code Family Communication: Patient and/or RN. Available if any question.  Level of care: Telemetry Medical Status is: Observation  The patient remains OBS appropriate and will d/c before 2 midnights.  Dispo: The patient is from: Home              Anticipated d/c is to: Home on 7/3              Patient currently is not medically stable to d/c.   Difficult to place patient No            Consultants:  None   Sch Meds:  Scheduled Meds:  aspirin EC  81 mg Oral Q breakfast   atorvastatin  20 mg Oral Daily   enoxaparin (LOVENOX) injection  40 mg Subcutaneous Q24H   insulin aspart  0-15 Units Subcutaneous TID WC   insulin aspart  0-5 Units Subcutaneous QHS   mirabegron ER  25 mg Oral Daily   sodium chloride flush  3 mL Intravenous Q12H   vitamin B-12  250 mcg Oral Daily   Continuous Infusions: PRN Meds:.acetaminophen **OR** acetaminophen, diclofenac Sodium,  lidocaine, polyethylene glycol, traMADol, traZODone  Antimicrobials: Anti-infectives (From admission, onward)    None        I have personally reviewed the following labs and images: CBC: Recent Labs  Lab 09/15/20 1240 09/16/20 0403  WBC 8.6 6.6  NEUTROABS 6.8  --   HGB 18.0* 17.7*  HCT 54.2* 53.5*  MCV 86.2 86.4  PLT 226 183   BMP &GFR Recent Labs  Lab 09/15/20 1240 09/16/20 0403  NA 136 138  K 4.8 3.8  CL 103 103  CO2 24 25  GLUCOSE 203* 121*  BUN 29* 25*  CREATININE 1.83* 1.59*  CALCIUM 9.6 9.3   Estimated Creatinine Clearance: 45.1 mL/min (A) (by C-G formula based on SCr of 1.59 mg/dL (H)). Liver & Pancreas: Recent Labs  Lab 09/15/20 1240 09/16/20 0403  AST 25 17  ALT 26 19  ALKPHOS 91 82  BILITOT 1.2 1.2  PROT 7.2 6.2*  ALBUMIN 3.8 3.3*   No results for input(s): LIPASE, AMYLASE in the last 168 hours. No results for input(s): AMMONIA in the last 168 hours. Diabetic: No results for input(s): HGBA1C in the last 72 hours. Recent Labs  Lab 09/15/20 2104 09/16/20 0634 09/16/20 1148  GLUCAP 235* 190* 152*   Cardiac Enzymes: No results for input(s): CKTOTAL, CKMB, CKMBINDEX, TROPONINI in the last 168 hours. No results for input(s): PROBNP in the last 8760 hours. Coagulation Profile: No results for input(s): INR, PROTIME in the last 168 hours. Thyroid Function Tests: No results for input(s): TSH, T4TOTAL, FREET4, T3FREE, THYROIDAB in the last 72 hours. Lipid Profile: No results for input(s): CHOL, HDL, LDLCALC, TRIG, CHOLHDL, LDLDIRECT in the last 72 hours. Anemia Panel: Recent Labs    09/16/20 0403  VITAMINB12 416   Urine analysis:    Component Value Date/Time   COLORURINE YELLOW 09/15/2020 1241   APPEARANCEUR CLEAR 09/15/2020 1241   LABSPEC 1.018 09/15/2020 1241   PHURINE 5.0 09/15/2020 1241   GLUCOSEU 150 (A) 09/15/2020 1241   HGBUR NEGATIVE 09/15/2020 1241   BILIRUBINUR NEGATIVE 09/15/2020 1241   BILIRUBINUR negative 08/26/2019  1516   KETONESUR NEGATIVE 09/15/2020 1241   PROTEINUR 100 (A) 09/15/2020 1241   UROBILINOGEN 0.2 08/26/2019 1516   UROBILINOGEN 0.2 04/18/2008 1153   NITRITE NEGATIVE 09/15/2020 1241   LEUKOCYTESUR NEGATIVE 09/15/2020 1241   Sepsis Labs: Invalid input(s): PROCALCITONIN, Bernie  Microbiology: Recent Results (from the past 240 hour(s))  SARS CORONAVIRUS 2 (TAT 6-24 HRS) Nasopharyngeal Nasopharyngeal Swab     Status: None   Collection Time: 09/15/20  7:49 PM   Specimen: Nasopharyngeal Swab  Result Value Ref Range Status   SARS Coronavirus 2 NEGATIVE NEGATIVE Final    Comment: (NOTE) SARS-CoV-2 target nucleic acids are NOT DETECTED.  The SARS-CoV-2 RNA is generally detectable in upper and lower respiratory specimens during the acute phase of infection. Negative results do not preclude SARS-CoV-2 infection, do not rule out co-infections with other pathogens, and should not be used as the sole basis for treatment or other patient management decisions. Negative results must be combined with clinical observations, patient history, and epidemiological information. The expected result is Negative.  Fact Sheet for Patients: SugarRoll.be  Fact Sheet for Healthcare Providers: https://www.woods-mathews.com/  This test is not yet approved or cleared by the [place] FDA and  has been authorized for detection and/or diagnosis of SARS-CoV-2 by FDA under an Emergency Use Authorization (EUA). This EUA will remain  in effect (meaning this test can be used) for the duration of the COVID-19 declaration under Se ction 564(b)(1) of the Act, 21 U.S.C. section 360bbb-3(b)(1), unless the authorization is terminated or revoked sooner.  Performed at [facility], [place] [address] [place], [place] [address]     Radiology Studies: MR ANGIO HEAD WO CONTRAST  Result Date: 09/15/2020 CLINICAL DATA:  Dizziness. EXAM: MRI HEAD WITHOUT CONTRAST MRA  HEAD WITHOUT CONTRAST TECHNIQUE: Multiplanar, multi-echo pulse sequences of the brain and surrounding structures were acquired without intravenous contrast. Angiographic images of the Circle of Willis were acquired using MRA technique without intravenous contrast. COMPARISON:  Jul 31, 2020. FINDINGS: MRI HEAD FINDINGS Brain: No acute infarction, hemorrhage, hydrocephalus, extra-axial collection or mass lesion. Similar generalized cerebral volume loss with ex vacuo ventricular dilation. Similar moderate patchy and confluent T2/FLAIR hyperintensities within the white matter, nonspecific but most likely related to chronic microvascular ischemic disease given the patient's  risk factors (including diabetes, CKD, and hypertension). 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 No significant mass effect. Vascular: See below. Skull and upper cervical spine: Normal marrow signal. Sinuses/Orbits: Mild ethmoid air cell mucosal thickening. Unremarkable orbits. Other: No mastoid effusions. MRA HEAD FINDINGS Anterior circulation: The visualized intracranial ICAs, MCAs, and ACAs are patent without flow limiting proximal stenosis. No aneurysm identified. No substantial change from the prior MRA. Posterior circulation: Bilateral visualized intradural vertebral arteries, basilar artery and posterior cerebral arteries are patent without evidence of flow-limiting proximal stenosis. No significant change from prior MRA. IMPRESSION: MRI: 1. No evidence of acute abnormality. Specifically, no acute infarct. 2. Similar moderate chronic microvascular ischemic disease and atrophy. MRA: No large vessel occlusion or proximal flow limiting stenosis. No significant change from the prior. Electronically Signed   By: Margaretha Sheffield MD   On: 09/15/2020 16:41   MR BRAIN WO CONTRAST  Result Date: 09/15/2020 CLINICAL DATA:  Dizziness. EXAM: MRI HEAD WITHOUT CONTRAST MRA HEAD WITHOUT  CONTRAST TECHNIQUE: Multiplanar, multi-echo pulse sequences of the brain and surrounding structures were acquired without intravenous contrast. Angiographic images of the Circle of Willis were acquired using MRA technique without intravenous contrast. COMPARISON:  Jul 31, 2020. FINDINGS: MRI HEAD FINDINGS Brain: No acute infarction, hemorrhage, hydrocephalus, extra-axial collection or mass lesion. Similar generalized cerebral volume loss with ex vacuo ventricular dilation. Similar moderate patchy and confluent T2/FLAIR hyperintensities within the white matter, nonspecific but most likely related to chronic microvascular ischemic disease given the patient's risk factors (including diabetes, CKD, and hypertension). 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 No significant mass effect. Vascular: See below. Skull and upper cervical spine: Normal marrow signal. Sinuses/Orbits: Mild ethmoid air cell mucosal thickening. Unremarkable orbits. Other: No mastoid effusions. MRA HEAD FINDINGS Anterior circulation: The visualized intracranial ICAs, MCAs, and ACAs are patent without flow limiting proximal stenosis. No aneurysm identified. No substantial change from the prior MRA. Posterior circulation: Bilateral visualized intradural vertebral arteries, basilar artery and posterior cerebral arteries are patent without evidence of flow-limiting proximal stenosis. No significant change from prior MRA. IMPRESSION: MRI: 1. No evidence of acute abnormality. Specifically, no acute infarct. 2. Similar moderate chronic microvascular ischemic disease and atrophy. MRA: No large vessel occlusion or proximal flow limiting stenosis. No significant change from the prior. Electronically Signed   By: Margaretha Sheffield MD   On: 09/15/2020 16:41      Maryan Sivak T. [place]  If 7PM-7AM, please contact night-coverage [email or web address] 09/16/2020, 3:31 PM

## 2020-09-16 NOTE — Progress Notes (Signed)
Refused bed alarm.  

## 2020-09-16 NOTE — Progress Notes (Signed)
No matter what and many times explanations to him about his safety, patient is non compliant,refused bed alarm, and refused his recliner.Patient is walking around with IV pole as his support for standing up and short ambulation.

## 2020-09-16 NOTE — Progress Notes (Signed)
New Admission Note:   Arrival Method: via stretcher from ED Mental Orientation: Alert and oriented x4 Telemetry: 5M03, CCMD notified Assessment: to be completed Skin: Intact, warm and dry IV: R Hand, infusing with NS at 150cc/hr Pain: 0/10 Tubes: None Safety Measures: Safety Fall Prevention Plan has been discussed  Admission: to be completed 5 [place] Orientation: Patient has been orientated to the room, unit and staff.   Family: none at bedside  Orders to be reviewed and implemented. Will continue to monitor the patient. Call light has been placed within reach and bed alarm has been activated.

## 2020-09-16 NOTE — Plan of Care (Signed)
  Problem: Education: Goal: Knowledge of General Education information will improve Description: Including pain rating scale, medication(s)/side effects and non-pharmacologic comfort measures Outcome: Not Progressing   Problem: Clinical Measurements: Goal: Will remain free from infection Outcome: Adequate for Discharge

## 2020-09-16 NOTE — Evaluation (Signed)
Physical Therapy Evaluation Patient Details Name: [name] MRN: [id number] DOB: 14-Nov-1955 Today's Date: 09/16/2020   History of Present Illness  65 y/o male presented to ED on 7/1 with complaints of dizziness on standing, difficulty walking, and falls. Patient recently seen 07/28/20 for similar events and d/c'ed to rehab facility, however checked himself out. MRI showed no acute abnormality. Workup for dizziness still pending. PMH: HTN, prostate cancer, CKD, DM, gout, and macular degeneration.  Clinical Impression  PTA, patient lives alone and reports needing assistance with iADLs. Patient reports ambulating without AD and sponge bathing at 1/2 bath on 1st level due to difficulty ascending flight of stairs to 2nd floor. Patient currently presents with generalized weakness, impaired balance, decreased activity tolerance, impaired cognition, poor safety awareness, and poor judgement. Patient currently requiring minA for short ambulation distance for balance and safety. Educated patient on need for assistance due to balance deficits, patient refused. Patient will benefit from skilled PT services during acute stay to address listed deficits. Recommend SNF at discharge to maximize functional independence and safety as patient does not have family support to provide necessary supervision/assistance at discharge and is NOT safe to return home alone at this time due to listed deficits above.      Follow Up Recommendations SNF;Supervision/Assistance - 24 hour    Equipment Recommendations  Other (comment) (TBD)    Recommendations for Other Services       Precautions / Restrictions Precautions Precautions: Fall Restrictions Weight Bearing Restrictions: No      Mobility  Bed Mobility               General bed mobility comments: seated EOB with MD on arrival    Transfers Overall transfer level: Needs assistance Equipment used: None Transfers: Sit to/from Stand Sit to Stand:  Supervision;Min assist         General transfer comment: supervision initially but requires minA to steady upon standing due to minor LOB  Ambulation/Gait Ambulation/Gait assistance: Min assist Gait Distance (Feet): 30 Feet Assistive device: None Gait Pattern/deviations: Step-to pattern;Decreased stride length;Decreased weight shift to right;Decreased weight shift to left;Wide base of support;Staggering left;Staggering right;Drifts right/left Gait velocity: decreased   General Gait Details: very hesitant gait requiring minA for balance due to patient reporting lightheadedness. Poor awareness of deficits and need for assistance for safety. Refused BP check at the time.  Stairs            Wheelchair Mobility    Modified Rankin (Stroke Patients Only)       Balance Overall balance assessment: Needs assistance Sitting-balance support: No upper extremity supported;Feet supported Sitting balance-Leahy Scale: Good     Standing balance support: No upper extremity supported;During functional activity Standing balance-Leahy Scale: Poor Standing balance comment: reliant on external assist                             Pertinent Vitals/Pain Pain Assessment: No/denies pain    Home Living Family/patient expects to be discharged to:: Private residence Living Arrangements: Alone Available Help at Discharge: Friend(s);Available PRN/intermittently Type of Home: Apartment Home Access: Stairs to enter   Entrance Stairs-Number of Steps: 1 Home Layout: Two level;Able to live on main level with bedroom/bathroom;1/2 bath on main level Home Equipment: Shower seat;[name] - 2 wheels;Bedside commode;Grab bars - tub/shower;Grab bars - toilet      Prior Function Level of Independence: Needs assistance   Gait / Transfers Assistance Needed: Walks without AD  ADL's /  Homemaking Assistance Needed: States he sponge bathes on 1st floor in 1/2 bath. Reports "lady friend" drives pt to  appointments and helps with getting groceries, etc.  Comments: lives on main level of home due to difficulty ascending flight of stairs     Hand Dominance        Extremity/Trunk Assessment   Upper Extremity Assessment Upper Extremity Assessment: Generalized weakness    Lower Extremity Assessment Lower Extremity Assessment: Generalized weakness       Communication   Communication: No difficulties  Cognition Arousal/Alertness: Awake/alert Behavior During Therapy: Flat affect Overall Cognitive Status: Impaired/Different from baseline Area of Impairment: Safety/judgement;Attention;Memory;Following commands;Awareness;Problem solving                   Current Attention Level: Sustained Memory: Decreased short-term memory Following Commands: Follows one step commands inconsistently;Follows one step commands with increased time Safety/Judgement: Decreased awareness of safety;Decreased awareness of deficits Awareness: Intellectual Problem Solving: Slow processing;Requires verbal cues;Difficulty sequencing General Comments: Repeatedly asking "who are you?" "what's your job here?" throughout the session. Poor safety awareness and judgement noted.      General Comments      Exercises     Assessment/Plan    PT Assessment Patient needs continued PT services  PT Problem List Decreased strength;Decreased activity tolerance;Decreased balance;Decreased mobility;Decreased cognition;Decreased coordination;Decreased safety awareness;Decreased knowledge of use of DME;Decreased knowledge of precautions       PT Treatment Interventions DME instruction;Gait training;Stair training;Functional mobility training;Therapeutic activities;Therapeutic exercise;Balance training;Cognitive remediation;Patient/family education    PT Goals (Current goals can be found in the Care Plan section)  Acute Rehab PT Goals Patient Stated Goal: to go home PT Goal Formulation: With patient Time For  Goal Achievement: 09/30/20 Potential to Achieve Goals: Fair    Frequency Min 2X/week   Barriers to discharge Decreased caregiver support;Inaccessible home environment      Co-evaluation               AM-PAC PT "6 Clicks" Mobility  Outcome Measure Help needed turning from your back to your side while in a flat bed without using bedrails?: A Little Help needed moving from lying on your back to sitting on the side of a flat bed without using bedrails?: A Little Help needed moving to and from a bed to a chair (including a wheelchair)?: A Little Help needed standing up from a chair using your arms (e.g., wheelchair or bedside chair)?: A Little Help needed to walk in hospital room?: A Little Help needed climbing 3-5 steps with a railing? : A Little 6 Click Score: 18    End of Session Equipment Utilized During Treatment: Gait belt Activity Tolerance: Patient tolerated treatment well Patient left: in bed;with call bell/phone within reach (seated EOB, refused bed alarm to be placed, RN notified) Nurse Communication: Mobility status;Other (comment) (refusal of bed alarm) PT Visit Diagnosis: Unsteadiness on feet (R26.81);Muscle weakness (generalized) (M62.81);History of falling (Z91.81);Difficulty in walking, not elsewhere classified (R26.2);Dizziness and giddiness (R42)    Time: 1035-1100 PT Time Calculation (min) (ACUTE ONLY): 25 min   Charges:   PT Evaluation $PT Eval Moderate Complexity: 1 Mod          Shaindy Reader A. Gilford Rile PT, DPT Acute Rehabilitation Services Pager [phone] Office [phone]   Linna Hoff 09/16/2020, 11:27 AM

## 2020-09-16 NOTE — Care Management Obs Status (Signed)
[place] NOTIFICATION   Patient Details  Name: [name] MRN: [id number] Date of Birth: 1955-09-27   Medicare Observation Status Notification Given:  Yes    Bartholomew Crews, RN 09/16/2020, 4:23 PM

## 2020-09-16 NOTE — Progress Notes (Addendum)
Negative result on new orthostatic assessment, but patient continue to be wobbly as evidence of him trying to grasp/hold the bedside table and still have that slight light headedness and blurred vision. MD made aware

## 2020-09-17 DIAGNOSIS — N1831 Chronic kidney disease, stage 3a: Secondary | ICD-10-CM | POA: Diagnosis not present

## 2020-09-17 DIAGNOSIS — E1121 Type 2 diabetes mellitus with diabetic nephropathy: Secondary | ICD-10-CM | POA: Diagnosis not present

## 2020-09-17 DIAGNOSIS — R42 Dizziness and giddiness: Secondary | ICD-10-CM | POA: Diagnosis not present

## 2020-09-17 DIAGNOSIS — N179 Acute kidney failure, unspecified: Principal | ICD-10-CM

## 2020-09-17 LAB — CBC
HCT: 50.7 % (ref 39.0–52.0)
Hemoglobin: 17 g/dL (ref 13.0–17.0)
MCH: 29.1 pg (ref 26.0–34.0)
MCHC: 33.5 g/dL (ref 30.0–36.0)
MCV: 86.7 fL (ref 80.0–100.0)
Platelets: 167 10*3/uL (ref 150–400)
RBC: 5.85 MIL/uL — ABNORMAL HIGH (ref 4.22–5.81)
RDW: 13.1 % (ref 11.5–15.5)
WBC: 7 10*3/uL (ref 4.0–10.5)
nRBC: 0 % (ref 0.0–0.2)

## 2020-09-17 LAB — GLUCOSE, CAPILLARY
Glucose-Capillary: 153 mg/dL — ABNORMAL HIGH (ref 70–99)
Glucose-Capillary: 167 mg/dL — ABNORMAL HIGH (ref 70–99)

## 2020-09-17 LAB — RENAL FUNCTION PANEL
Albumin: 3.2 g/dL — ABNORMAL LOW (ref 3.5–5.0)
Anion gap: 7 (ref 5–15)
BUN: 23 mg/dL (ref 8–23)
CO2: 23 mmol/L (ref 22–32)
Calcium: 9 mg/dL (ref 8.9–10.3)
Chloride: 106 mmol/L (ref 98–111)
Creatinine, Ser: 1.39 mg/dL — ABNORMAL HIGH (ref 0.61–1.24)
GFR, Estimated: 56 mL/min — ABNORMAL LOW (ref >60)
Glucose, Bld: 165 mg/dL — ABNORMAL HIGH (ref 70–99)
Phosphorus: 3.2 mg/dL (ref 2.5–4.6)
Potassium: 3.8 mmol/L (ref 3.5–5.1)
Sodium: 136 mmol/L (ref 135–145)

## 2020-09-17 LAB — RPR: RPR Ser Ql: NONREACTIVE

## 2020-09-17 LAB — MAGNESIUM: Magnesium: 1.6 mg/dL — ABNORMAL LOW (ref 1.7–2.4)

## 2020-09-17 MED ORDER — MAGNESIUM SULFATE 2 GM/50ML IV SOLN
2.0000 g | Freq: Once | INTRAVENOUS | Status: AC
  Administered 2020-09-17: 2 g via INTRAVENOUS
  Filled 2020-09-17: qty 50

## 2020-09-17 MED ORDER — OLMESARTAN MEDOXOMIL-HCTZ 40-25 MG PO TABS: 1.0000 | ORAL_TABLET | Freq: Every day | ORAL | 1 refills | Status: DC

## 2020-09-17 MED ORDER — AMLODIPINE BESYLATE 5 MG PO TABS
5.0000 mg | ORAL_TABLET | Freq: Every day | ORAL | Status: DC
  Administered 2020-09-17: 5 mg via ORAL
  Filled 2020-09-17: qty 1
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
## 2020-09-17 NOTE — TOC Transition Note (Signed)
Transition of Care [facility]) - CM/SW Discharge Note   Patient Details  Name: [name] MRN: [id number] Date of Birth: [birth year]  Transition [facility]) CM/SW Contact:  Bartholomew Crews, RN Phone Number: [phone] 09/17/2020, 1:24 PM   Clinical Narrative:     Patient to transition home today. Referral for [facility] PT accepted by [facility]. HH order in place per MD. No further TOC needs at this time.   Final next level of care: [place] Barriers to Discharge: No Barriers Identified   Patient Goals and CMS Choice Patient states their goals for this hospitalization and ongoing recovery are:: return home CMS Medicare.gov Compare Post Acute Care list provided to:: Patient Choice offered to / list presented to : Patient  Discharge Placement                       Discharge Plan and Services In-house Referral: NA Discharge Planning Services: CM Consult Post Acute Care Choice: Home Health          DME Arranged: N/A DME Agency: NA       HH Arranged: PT HH Agency: [place] Date [place]: 09/17/20 Time [place]: [id number] Representative spoke with at [place]: [place] Interventions     Readmission Risk Interventions Readmission Risk Prevention Plan 10/05/2019  Transportation Screening Complete  PCP or Specialist Appt within 5-7 Days Complete  Home Care Screening Complete  Medication Review (RN CM) Complete  Some recent data might be hidden

## 2020-09-19 ENCOUNTER — Telehealth: Payer: Self-pay

## 2020-09-19 NOTE — Telephone Encounter (Signed)
Transition Care Management Follow-up Telephone Call Date of discharge and from where: 09/17/2020 [facility] . How have you been since you were released from the hospital? Pt said he is a bit agitated, withe the process and the outcome  Any questions or concerns? No  Items Reviewed: Did the pt receive and understand the discharge instructions provided? Yes  Medications obtained and verified? Yes  Other? No  Any new allergies since your discharge? No  Dietary orders reviewed? Yes Do you have support at home? Yes   Home Care and Equipment/Supplies: Were home health services ordered? not applicable If so, what is the name of the agency? N/a  Has the agency set up a time to come to the patient's home? not applicable Were any new equipment or medical supplies ordered?  No What is the name of the medical supply agency? N/A Were you able to get the supplies/equipment? not applicable Do you have any questions related to the use of the equipment or supplies? No  Functional Questionnaire: (I = Independent and D = Dependent) ADLs: I  Bathing/Dressing- I  Meal Prep- I  Eating- I  Maintaining continence- I  Transferring/Ambulation- I  Managing Meds- I  Follow up appointments reviewed:  PCP Hospital f/u appt confirmed? No  Scheduled to see N/A on N/A @ N/A. [facility] f/u appt confirmed? No  Scheduled to see N/A on N/A @ N/A. Are transportation arrangements needed? No  If their condition worsens, is the pt aware to call PCP or go to the Emergency Dept.? Yes Was the patient provided with contact information for the PCP's office or ED? Yes Was to pt encouraged to call back with questions or concerns? Yes   ** PT DID DECLINE TIME OFFERED FOR 09/26/2020. HE SAID HE WOULD CALL BACK WHEN MOST CONVENIENT FOR HIM.

## 2020-09-21 ENCOUNTER — Telehealth: Payer: Self-pay

## 2020-09-21 ENCOUNTER — Inpatient Hospital Stay: Payer: Self-pay | Admitting: Nurse Practitioner

## 2020-09-21 NOTE — Chronic Care Management (AMB) (Signed)
Chronic Care Management Pharmacy Assistant   Name: [name]  MRN: [id number] DOB: 1955/11/08   Reason for Encounter: Disease State/ Diabetes   Recent office visits:  08-09-2020 [name] (CCM)  08-22-2020 [name] (CCM)  09-04-2020 [name] (CCM)  Recent consult visits:  none  Hospital visits:  Medication Reconciliation was completed by comparing discharge summary, patient's EMR and Pharmacy list, and upon discussion with patient.  Admitted to the hospital on 07-30-2020 due to Dizziness. Discharge date was 08-03-2020. Discharged from [place]?Medications Started at [facility] Discharge:?? None  Medication Changes at Hospital Discharge: None  Medications Discontinued at Hospital Discharge: Ibuprofen 200 mg  Medications that remain the same after Hospital Discharge:??  -All other medications will remain the same.    Hospital visits:  Medication Reconciliation was completed by comparing discharge summary, patient's EMR and Pharmacy list, and upon discussion with patient.  Admitted to the hospital on 09-03-2020 due to low back pain. Discharge date was 09-03-2020. Discharged from [place]?Medications Started at [facility] Discharge:?? Voltaren 1 % gel apply 4 grams 4 time daily. Lidocaine 5% patches Place 2 patches onto the skin daily. Remove & Discard patch within 12 hours or as directed by MD. Place 1 patch onto the skin daily. Remove & Discard patch within 12 hours or as directed by MD  Medication Changes at Hospital Discharge: None  Medications Discontinued at Hospital Discharge: None  Medications that remain the same after Hospital Discharge:??  -All other medications will remain the same.    Hospital visits:  Medication Reconciliation was completed by comparing discharge summary, patient's EMR and Pharmacy list, and upon discussion with patient.  Admitted to the hospital on 09-15-2020 due to Acute  renal failure superimposed on stage 3a chronic kidney disease. Discharge date was 09-17-2020. Discharged from [place]?Medications Started at [facility] Discharge:?? None  Medication Changes at Hospital Discharge: None  Medications Discontinued at Hospital Discharge: Revatio 20 mg and benadryl 25 mg.  Medications that remain the same after Hospital Discharge:??  -All other medications will remain the same.   Medications: Outpatient Encounter Medications as of 09/21/2020  Medication Sig   acetaminophen (TYLENOL) 325 MG tablet Take 2 tablets (650 mg total) by mouth every 6 (six) hours as needed for mild pain (or Fever >/= 101).   aspirin EC 81 MG tablet Take 81 mg by mouth daily with breakfast.    atorvastatin (LIPITOR) 20 MG tablet Take 1 tablet (20 mg total) by mouth daily.   clotrimazole-betamethasone (LOTRISONE) cream Apply 1 application topically 2 (two) times daily.   diclofenac Sodium (VOLTAREN) 1 % GEL Apply 4 g topically 4 (four) times daily. (Patient taking differently: Apply 4 g topically daily as needed.)   metoprolol tartrate (LOPRESSOR) 50 MG tablet Take 1 tablet (50 mg total) by mouth 2 (two) times daily.   MYRBETRIQ 25 MG TB24 tablet TAKE 1 TABLET BY MOUTH EVERY DAY (Patient taking differently: Take 25 mg by mouth daily.)   nystatin (NYSTATIN) powder Apply 1 application topically 3 (three) times daily. (Patient taking differently: Apply 1 application topically 2 (two) times daily as needed (itching/rash).)   olmesartan-hydrochlorothiazide (BENICAR HCT) 40-25 MG tablet Take 1 tablet by mouth daily.   Semaglutide (RYBELSUS) 14 MG TABS Take 1 tablet by mouth daily. 30 minutes before breakfast (Patient taking differently: Take 1 tablet by mouth daily.)   Tetrahydrozoline HCl (VISINE OP) Place 1 drop into  both eyes daily as needed (itching/irritation).   traMADol (ULTRAM) 50 MG tablet Take 1 tablet (50 mg total) by mouth every 6 (six) hours as needed for severe  pain.   traZODone (DESYREL) 50 MG tablet TAKE 1 TABLET (50 MG TOTAL) BY MOUTH AT BEDTIME AS NEEDED FOR SLEEP.   No facility-administered encounter medications on file as of 09/21/2020.   Recent Relevant Labs: Lab Results  Component Value Date/Time   HGBA1C 8.1 (H) 07/17/2020 02:50 PM   HGBA1C 7.2 (H) 03/13/2020 11:39 AM   HGBA1C 7.6 06/26/2016 12:00 AM   HGBA1C 7.6 06/26/2016 12:00 AM   MICROALBUR 80 08/26/2019 03:15 PM   MICROALBUR 150 03/26/2018 11:57 AM    Kidney Function Lab Results  Component Value Date/Time   CREATININE 1.39 (H) 09/17/2020 04:52 AM   CREATININE 1.59 (H) 09/16/2020 04:03 AM   GFRNONAA 56 (L) 09/17/2020 04:52 AM   GFRAA 48 (L) 03/13/2020 11:39 AM    Current antihyperglycemic regimen:  Rybelsus 14 mg daily Tradjenta 5 mg daily  What recent interventions/DTPs have been made to improve glycemic control:  Counseled to check feet daily and get yearly eye exams Counseled on diet and exercise extensively  Have there been any recent hospitalizations or ED visits since last visit with CPP? No  Patient denies hypoglycemic symptoms  Patient denies hyperglycemic symptoms  How often are you checking your blood sugar? Patient stated twice weekly  What are your blood sugars ranging?  Fasting: 140 Before meals: None After meals: None Bedtime: None  During the week, how often does your blood glucose drop below 70? Never  Are you checking your feet daily/regularly? Patient states daily  Adherence Review: Is the patient currently on a STATIN medication? Yes Is the patient currently on ACE/ARB medication? Yes Does the patient have >5 day gap between last estimated fill dates? No  NOTES: Sent scheduling a message to schedule patient with Orlando Penner CPP on 10-26-2020 at 2:45.  Care Gaps: RAF= 1.25 Covid vaccine, Ophthalmology exam, TDAP, Shingrix, Colonoscopy and PCV13 overdue. AWV was canceled on 09-21-2020  Star Rating Drugs: Rybelsus 14 mg- Last  filled 07-17-2020 [facility] Atorvastatin 20 mg- Last filled 08-02-2020 30 DS [facility], 07-25-2020 [facility] Olmesartan-HCTZ 40-25 mg- Last filled 08-15-2020 [facility] Tradjenta 5 mg- Last filled 07-14-2020 [facility]  [place] Clinical Pharmacist Assistant [phone]

## 2020-09-22 ENCOUNTER — Telehealth: Payer: Medicare Other

## 2020-09-22 ENCOUNTER — Telehealth: Payer: Self-pay

## 2020-09-22 NOTE — Telephone Encounter (Signed)
  Care Management   Follow Up Note   09/22/2020 Name: [name] MRN: [id number] DOB: 1955/09/09   Referred by: [name], [name] Reason for referral : Chronic Care Management (Unsuccessful call)   An unsuccessful telephone outreach was attempted today. The patient was referred to the case management team for assistance with care management and care coordination. SW left a HIPAA compliant voice message requesting a return call.  Follow Up Plan: The care management team will reach out to the patient again over the next 30 days.   [name], BSW, CDP Social Worker, Certified Dementia Practitioner [place] [facility] [place] [facility] [phone]

## 2020-09-25 ENCOUNTER — Telehealth: Payer: Medicare Other

## 2020-09-25 ENCOUNTER — Telehealth: Payer: Self-pay

## 2020-09-25 NOTE — Telephone Encounter (Signed)
  Care Management   Follow Up Note   09/25/2020 Name: [name] MRN: [id number] DOB: Jul 03, 1955   Referred by: [name], [name] Reason for referral : Chronic Care Management (RN CM Follow up call - 2nd attempt )   A second unsuccessful telephone outreach was attempted today. The patient was referred to the case management team for assistance with care management and care coordination.   Follow Up Plan: A HIPPA compliant phone message was left for the patient providing contact information and requesting a return call.   Barb Merino, RN, BSN, CCM Care Management Coordinator [place] [facility]  Direct Phone: [phone]

## 2020-09-29 ENCOUNTER — Encounter (HOSPITAL_COMMUNITY): Payer: Self-pay | Admitting: Emergency Medicine

## 2020-09-29 ENCOUNTER — Emergency Department (HOSPITAL_COMMUNITY)
Admission: EM | Admit: 2020-09-29 | Discharge: 2020-09-29 | Disposition: A | Discharge status: Home / Self Care | Payer: Medicare Other | Attending: Emergency Medicine | Admitting: Emergency Medicine

## 2020-09-29 ENCOUNTER — Other Ambulatory Visit: Payer: Self-pay

## 2020-09-29 DIAGNOSIS — R2681 Unsteadiness on feet: Secondary | ICD-10-CM | POA: Insufficient documentation

## 2020-09-29 DIAGNOSIS — R42 Dizziness and giddiness: Secondary | ICD-10-CM | POA: Insufficient documentation

## 2020-09-29 DIAGNOSIS — R404 Transient alteration of awareness: Secondary | ICD-10-CM | POA: Diagnosis not present

## 2020-09-29 DIAGNOSIS — I499 Cardiac arrhythmia, unspecified: Secondary | ICD-10-CM | POA: Diagnosis not present

## 2020-09-29 DIAGNOSIS — Z5321 Procedure and treatment not carried out due to patient leaving prior to being seen by health care provider: Secondary | ICD-10-CM | POA: Insufficient documentation

## 2020-09-29 DIAGNOSIS — Z743 Need for continuous supervision: Secondary | ICD-10-CM | POA: Diagnosis not present

## 2020-09-29 DIAGNOSIS — R6889 Other general symptoms and signs: Secondary | ICD-10-CM | POA: Diagnosis not present

## 2020-09-29 NOTE — ED Triage Notes (Addendum)
Per ems, pt was picked up at [facility] c/o dizziness with an unsteady gait since last night. Same sx have been intermittent for past 3 months. Pt denies pain. Stroke scale negative with ems. EMS EKG unremarkable. CBG 203.

## 2020-09-29 NOTE — ED Notes (Signed)
Pt stated he wasn't coming back to triage and to push him outside. Pt was rolled outside in wheelchair and stated he was going to call himself a taxi.

## 2020-09-29 NOTE — ED Provider Notes (Signed)
Emergency Medicine Provider Triage Evaluation Note  [name] , a 65 y.o. male  was evaluated in triage.  Pt complains of dizziness and lightheadedness.  Patient reports that he has been having the symptoms intermittently over the last 3 months.  Patient reports that after waking this morning sitting on the side of his bed felt dizzy and lightheaded.  The symptoms have persisted throughout the day.  Patient denies any associated chest pain, shortness of breath, facial asymmetry, slurred speech, numbness, weakness.  Patient states that he has developed a headache gradually while sitting in chair and emergency department.  Patient endorses drinking 1 beer earlier today at lunch.  Review of Systems  Positive: Lightheadedness, dizziness Negative: Chest pain, shortness of breath, facial asymmetry, slurred speech, numbness,, abdominal pain, nausea, vomiting  Physical Exam  BP (!) 159/93   Pulse 85   Temp 98 F (36.7 C)   Resp 16   Ht 5\' 6"  (1.676 m)   Wt 81.2 kg   SpO2 97%   BMI 28.89 kg/m  Gen:   Awake, no distress   Resp:  Normal effort  MSK:   Moves extremities without difficulty  Other:  No slurred speech or facial asymmetry noted.  Grip strength equal.  Sensation to light touch intact to bilateral upper and lower extremities.  Abdomen soft, nondistended, nontender, no mass or pulsatile mass.  +2 radial pulse bilaterally.  Medical Decision Making  Medically screening exam initiated at 3:15 PM.  Appropriate orders placed.  KOBE OFALLON was informed that the remainder of the evaluation will be completed by another provider, this initial triage assessment does not replace that evaluation, and the importance of remaining in the ED until their evaluation is complete.  The patient appears stable so that the remainder of the work up may be completed by another provider.      Loni Beckwith, PA-C 09/29/20 [place], DO 09/29/20 1624

## 2020-10-01 ENCOUNTER — Emergency Department (HOSPITAL_COMMUNITY)
Admission: EM | Admit: 2020-10-01 | Discharge: 2020-10-01 | Disposition: A | Discharge status: Home / Self Care | Payer: Medicare Other

## 2020-10-01 ENCOUNTER — Other Ambulatory Visit: Payer: Self-pay

## 2020-10-01 DIAGNOSIS — R52 Pain, unspecified: Secondary | ICD-10-CM | POA: Diagnosis not present

## 2020-10-01 DIAGNOSIS — Z743 Need for continuous supervision: Secondary | ICD-10-CM | POA: Diagnosis not present

## 2020-10-01 DIAGNOSIS — W19XXXA Unspecified fall, initial encounter: Secondary | ICD-10-CM | POA: Diagnosis not present

## 2020-10-01 DIAGNOSIS — M25571 Pain in right ankle and joints of right foot: Secondary | ICD-10-CM | POA: Diagnosis not present

## 2020-10-01 NOTE — ED Notes (Addendum)
Pt stated that he no longer needed to be seen and wanted to call his friend to pick him up. Pt did not want VS taken or to comply with basic triage.  RN explained risk/benefits of leaving prior to being seen. Pt verbalized understanding to return if s/s worsen.

## 2020-10-02 ENCOUNTER — Emergency Department (HOSPITAL_COMMUNITY)
Admission: EM | Admit: 2020-10-02 | Discharge: 2020-10-02 | Discharge status: Against Medical Advice | Payer: Medicare Other

## 2020-10-02 ENCOUNTER — Other Ambulatory Visit: Payer: Self-pay

## 2020-10-02 ENCOUNTER — Telehealth: Payer: Self-pay

## 2020-10-02 DIAGNOSIS — R6889 Other general symptoms and signs: Secondary | ICD-10-CM | POA: Diagnosis not present

## 2020-10-02 DIAGNOSIS — Z743 Need for continuous supervision: Secondary | ICD-10-CM | POA: Diagnosis not present

## 2020-10-02 DIAGNOSIS — R404 Transient alteration of awareness: Secondary | ICD-10-CM | POA: Diagnosis not present

## 2020-10-02 DIAGNOSIS — R0789 Other chest pain: Secondary | ICD-10-CM | POA: Diagnosis not present

## 2020-10-02 DIAGNOSIS — R079 Chest pain, unspecified: Secondary | ICD-10-CM | POA: Diagnosis not present

## 2020-10-02 NOTE — ED Notes (Signed)
Patient decided he did not need to be seen and would see his primary care doctor since he had to wait in the waiting room. Patient well ambulatory with a steady gait and NAD.

## 2020-10-02 NOTE — Telephone Encounter (Signed)
Transition Care Management Unsuccessful Follow-up Telephone Call  Date of discharge and from where:  09/29/2020 , [name].   Attempts:  1st Attempt  Reason for unsuccessful TCM follow-up call:  number invalid.

## 2020-10-03 ENCOUNTER — Other Ambulatory Visit: Payer: Self-pay

## 2020-10-03 ENCOUNTER — Encounter (HOSPITAL_BASED_OUTPATIENT_CLINIC_OR_DEPARTMENT_OTHER): Payer: Self-pay | Admitting: *Deleted

## 2020-10-03 ENCOUNTER — Emergency Department (HOSPITAL_BASED_OUTPATIENT_CLINIC_OR_DEPARTMENT_OTHER)
Admission: EM | Admit: 2020-10-03 | Discharge: 2020-10-03 | Disposition: A | Discharge status: Home / Self Care | Payer: Medicare Other | Attending: Emergency Medicine | Admitting: Emergency Medicine

## 2020-10-03 DIAGNOSIS — Z7982 Long term (current) use of aspirin: Secondary | ICD-10-CM | POA: Diagnosis not present

## 2020-10-03 DIAGNOSIS — Z20822 Contact with and (suspected) exposure to covid-19: Secondary | ICD-10-CM | POA: Insufficient documentation

## 2020-10-03 DIAGNOSIS — Z79899 Other long term (current) drug therapy: Secondary | ICD-10-CM | POA: Insufficient documentation

## 2020-10-03 DIAGNOSIS — Y900 Blood alcohol level of less than 20 mg/100 ml: Secondary | ICD-10-CM | POA: Diagnosis not present

## 2020-10-03 DIAGNOSIS — F418 Other specified anxiety disorders: Secondary | ICD-10-CM | POA: Insufficient documentation

## 2020-10-03 DIAGNOSIS — E1122 Type 2 diabetes mellitus with diabetic chronic kidney disease: Secondary | ICD-10-CM | POA: Diagnosis not present

## 2020-10-03 DIAGNOSIS — Z743 Need for continuous supervision: Secondary | ICD-10-CM | POA: Diagnosis not present

## 2020-10-03 DIAGNOSIS — N1831 Chronic kidney disease, stage 3a: Secondary | ICD-10-CM | POA: Diagnosis not present

## 2020-10-03 DIAGNOSIS — R0789 Other chest pain: Secondary | ICD-10-CM | POA: Diagnosis not present

## 2020-10-03 DIAGNOSIS — Z8616 Personal history of COVID-19: Secondary | ICD-10-CM | POA: Diagnosis not present

## 2020-10-03 DIAGNOSIS — F32A Depression, unspecified: Secondary | ICD-10-CM

## 2020-10-03 DIAGNOSIS — I499 Cardiac arrhythmia, unspecified: Secondary | ICD-10-CM | POA: Diagnosis not present

## 2020-10-03 DIAGNOSIS — F419 Anxiety disorder, unspecified: Secondary | ICD-10-CM | POA: Diagnosis present

## 2020-10-03 DIAGNOSIS — R42 Dizziness and giddiness: Secondary | ICD-10-CM | POA: Insufficient documentation

## 2020-10-03 DIAGNOSIS — I129 Hypertensive chronic kidney disease with stage 1 through stage 4 chronic kidney disease, or unspecified chronic kidney disease: Secondary | ICD-10-CM | POA: Diagnosis not present

## 2020-10-03 DIAGNOSIS — F4323 Adjustment disorder with mixed anxiety and depressed mood: Secondary | ICD-10-CM | POA: Insufficient documentation

## 2020-10-03 DIAGNOSIS — R079 Chest pain, unspecified: Secondary | ICD-10-CM | POA: Diagnosis not present

## 2020-10-03 DIAGNOSIS — R6889 Other general symptoms and signs: Secondary | ICD-10-CM | POA: Diagnosis not present

## 2020-10-03 DIAGNOSIS — R000 Tachycardia, unspecified: Secondary | ICD-10-CM | POA: Diagnosis not present

## 2020-10-03 LAB — CBC WITH DIFFERENTIAL/PLATELET
Abs Immature Granulocytes: 0.03 10*3/uL (ref 0.00–0.07)
Basophils Absolute: 0.1 10*3/uL (ref 0.0–0.1)
Basophils Relative: 1 %
Eosinophils Absolute: 0 10*3/uL (ref 0.0–0.5)
Eosinophils Relative: 0 %
HCT: 56.6 % — ABNORMAL HIGH (ref 39.0–52.0)
Hemoglobin: 18.9 g/dL — ABNORMAL HIGH (ref 13.0–17.0)
Immature Granulocytes: 0 %
Lymphocytes Relative: 7 %
Lymphs Abs: 0.7 10*3/uL (ref 0.7–4.0)
MCH: 28.5 pg (ref 26.0–34.0)
MCHC: 33.4 g/dL (ref 30.0–36.0)
MCV: 85.5 fL (ref 80.0–100.0)
Monocytes Absolute: 0.7 10*3/uL (ref 0.1–1.0)
Monocytes Relative: 7 %
Neutro Abs: 9.5 10*3/uL — ABNORMAL HIGH (ref 1.7–7.7)
Neutrophils Relative %: 85 %
Platelets: 213 10*3/uL (ref 150–400)
RBC: 6.62 MIL/uL — ABNORMAL HIGH (ref 4.22–5.81)
RDW: 14.1 % (ref 11.5–15.5)
WBC: 11.1 10*3/uL — ABNORMAL HIGH (ref 4.0–10.5)
nRBC: 0 % (ref 0.0–0.2)

## 2020-10-03 LAB — COMPREHENSIVE METABOLIC PANEL
ALT: 22 U/L (ref 0–44)
AST: 25 U/L (ref 15–41)
Albumin: 4.2 g/dL (ref 3.5–5.0)
Alkaline Phosphatase: 90 U/L (ref 38–126)
Anion gap: 13 (ref 5–15)
BUN: 34 mg/dL — ABNORMAL HIGH (ref 8–23)
CO2: 24 mmol/L (ref 22–32)
Calcium: 9.7 mg/dL (ref 8.9–10.3)
Chloride: 101 mmol/L (ref 98–111)
Creatinine, Ser: 1.64 mg/dL — ABNORMAL HIGH (ref 0.61–1.24)
GFR, Estimated: 46 mL/min — ABNORMAL LOW (ref >60)
Glucose, Bld: 176 mg/dL — ABNORMAL HIGH (ref 70–99)
Potassium: 4.1 mmol/L (ref 3.5–5.1)
Sodium: 138 mmol/L (ref 135–145)
Total Bilirubin: 1.3 mg/dL — ABNORMAL HIGH (ref 0.3–1.2)
Total Protein: 7.8 g/dL (ref 6.5–8.1)

## 2020-10-03 LAB — RAPID URINE DRUG SCREEN, HOSP PERFORMED
Amphetamines: NOT DETECTED
Barbiturates: NOT DETECTED
Benzodiazepines: NOT DETECTED
Cocaine: NOT DETECTED
Opiates: NOT DETECTED
Tetrahydrocannabinol: NOT DETECTED

## 2020-10-03 LAB — RESP PANEL BY RT-PCR (FLU A&B, COVID) ARPGX2
Influenza A by PCR: NEGATIVE
Influenza B by PCR: NEGATIVE
SARS Coronavirus 2 by RT PCR: NEGATIVE

## 2020-10-03 LAB — ETHANOL: Alcohol, Ethyl (B): <10 mg/dL (ref <10)

## 2020-10-03 MED ORDER — LORAZEPAM 2 MG/ML IJ SOLN
1.0000 mg | Freq: Once | INTRAMUSCULAR | Status: AC
  Administered 2020-10-03: 1 mg via INTRAVENOUS
  Filled 2020-10-03: qty 1

## 2020-10-03 NOTE — ED Notes (Signed)
Pt decided to get a cab voucher. Pt wants to stay at lobby while waiting for his cab arrived
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
## 2020-10-03 NOTE — ED Notes (Signed)
Patient offerred several times a Wheelchair to exit the Department and a Cab Voucher to leave area to destination. Patient refused both services several times with no apparent reason. Patient ambulating towards exit and still refusing services. Patient states he does not wish to be "bothered".

## 2020-10-03 NOTE — Discharge Instructions (Addendum)
You have been seen and discharged from the emergency department.  Follow-up with your primary provider for reevaluation and further care.  You may follow-up at [facility] for further evaluation and treatment of anxiety.  Take home medications as prescribed. If you have any worsening symptoms or further concerns for your health please return to an emergency department for further evaluation.
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
## 2020-10-03 NOTE — ED Notes (Signed)
TTS at bedside to complete assessment via telehealth

## 2020-10-03 NOTE — ED Provider Notes (Signed)
Patient signed out to me by previous provider.  Please refer to their note for full HPI.  Briefly this is a 65 year old male who presented with concern for anxiety and depression.  No SI/HI.  He has been medically cleared for TTS evaluation.  TTS has evaluated the patient, they recommend outpatient psychiatric support.  Patient has been given resources and will be discharged. He continues to deny SI/HI Patient at this time appears safe and stable for discharge and will be treated as an outpatient.  Discharge plan and strict return to ED precautions discussed, patient verbalizes understanding and agreement.   Lorelle Gibbs, DO 10/03/20 2037
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
## 2020-10-04 ENCOUNTER — Telehealth: Payer: Self-pay

## 2020-10-04 DIAGNOSIS — E1121 Type 2 diabetes mellitus with diabetic nephropathy: Secondary | ICD-10-CM

## 2020-10-04 MED ORDER — ATORVASTATIN CALCIUM 40 MG PO TABS: ORAL_TABLET | ORAL | 2 refills | Status: DC

## 2020-10-04 NOTE — Telephone Encounter (Signed)
New rx for atorvastatin sent to the pt's pharmacy .

## 2020-10-07 ENCOUNTER — Emergency Department (HOSPITAL_COMMUNITY)
Admission: EM | Admit: 2020-10-07 | Discharge: 2020-10-07 | Disposition: A | Discharge status: Home / Self Care | Payer: Medicare Other | Attending: Emergency Medicine | Admitting: Emergency Medicine

## 2020-10-07 ENCOUNTER — Other Ambulatory Visit: Payer: Self-pay

## 2020-10-07 DIAGNOSIS — R404 Transient alteration of awareness: Secondary | ICD-10-CM | POA: Diagnosis not present

## 2020-10-07 DIAGNOSIS — R42 Dizziness and giddiness: Secondary | ICD-10-CM | POA: Insufficient documentation

## 2020-10-07 DIAGNOSIS — R319 Hematuria, unspecified: Secondary | ICD-10-CM | POA: Diagnosis not present

## 2020-10-07 DIAGNOSIS — M549 Dorsalgia, unspecified: Secondary | ICD-10-CM | POA: Diagnosis not present

## 2020-10-07 DIAGNOSIS — Z5321 Procedure and treatment not carried out due to patient leaving prior to being seen by health care provider: Secondary | ICD-10-CM | POA: Insufficient documentation

## 2020-10-07 DIAGNOSIS — Z743 Need for continuous supervision: Secondary | ICD-10-CM | POA: Diagnosis not present

## 2020-10-07 DIAGNOSIS — I499 Cardiac arrhythmia, unspecified: Secondary | ICD-10-CM | POA: Diagnosis not present

## 2020-10-07 NOTE — ED Notes (Signed)
Pt refusing blood draw and EGK. Pt states he wants to leave and called someone to come pick him up.

## 2020-10-07 NOTE — ED Notes (Signed)
Pt refused vitals 2x prior to RN assessment.

## 2020-10-07 NOTE — ED Notes (Signed)
Pt stated they were leaving  

## 2020-10-07 NOTE — ED Triage Notes (Signed)
Pt via EMS here for continued dizziness since May. Pt stumbling around in triage room with unsteady gait. No fall. Reports dizziness even when at rest. Seen multiple times for same. Pt also reports hematuria and painful urination x a few weeks.

## 2020-10-09 ENCOUNTER — Telehealth: Payer: Self-pay

## 2020-10-09 ENCOUNTER — Ambulatory Visit: Payer: Self-pay

## 2020-10-09 DIAGNOSIS — R42 Dizziness and giddiness: Secondary | ICD-10-CM

## 2020-10-09 DIAGNOSIS — F32A Depression, unspecified: Secondary | ICD-10-CM

## 2020-10-09 DIAGNOSIS — F329 Major depressive disorder, single episode, unspecified: Secondary | ICD-10-CM

## 2020-10-09 DIAGNOSIS — I10 Essential (primary) hypertension: Secondary | ICD-10-CM

## 2020-10-09 DIAGNOSIS — R404 Transient alteration of awareness: Secondary | ICD-10-CM | POA: Diagnosis not present

## 2020-10-09 DIAGNOSIS — I499 Cardiac arrhythmia, unspecified: Secondary | ICD-10-CM | POA: Diagnosis not present

## 2020-10-09 DIAGNOSIS — E1121 Type 2 diabetes mellitus with diabetic nephropathy: Secondary | ICD-10-CM

## 2020-10-09 DIAGNOSIS — Z743 Need for continuous supervision: Secondary | ICD-10-CM | POA: Diagnosis not present

## 2020-10-09 NOTE — Chronic Care Management (AMB) (Signed)
  Care Management    Consult Note  10/09/2020 Name: [name] MRN: [id number] DOB: 07/19/1955  Care management team received notification of patient's multiple  emergency department visits over the past 10 days related to Anxiety, Depression, and Dizziness  .Based on review of health record, [name] is currently active in the embedded care coordination program.. Unsuccessful outbound call placed to the patient to assist with care coordination needs. The patients contact number is not currently accepting calls at this time.  Review of patient status, including review of consultants reports, relevant laboratory and other test results, and collaboration with appropriate care team members and the patient's provider was performed as part of comprehensive patient evaluation and provision of chronic care management services.    Plan:  Collaboration with RN Care Manager and Pharmacist to inform of recent ED visits. A member of the care management team will attempt to contact the patient over the next 14 days.  [name], BSW, CDP Social Worker, Certified Dementia Practitioner [place] [facility] [phone]

## 2020-10-09 NOTE — Telephone Encounter (Signed)
Transition Care Management Unsuccessful Follow-up Telephone Call  Date of discharge and from where:  10/07/2020 [facility]   Attempts:  1st Attempt  Reason for unsuccessful TCM follow-up call:  No answer/busy

## 2020-10-11 ENCOUNTER — Ambulatory Visit: Payer: Medicare Other

## 2020-10-11 ENCOUNTER — Telehealth: Payer: Self-pay

## 2020-10-11 NOTE — Telephone Encounter (Signed)
This nurse attempted to call patient three times for AWV visit. Called at 1155, 1200, and 1210. The number stated that it was not accepting calls at this time. Unable to leave a message.

## 2020-10-12 ENCOUNTER — Telehealth: Payer: Self-pay

## 2020-10-12 NOTE — Telephone Encounter (Signed)
Transition Care Management Unsuccessful Follow-up Telephone Call  Date of discharge and from where: 10/10/2020 [facility]  Attempts:  1st Attempt  Reason for unsuccessful TCM follow-up call:  No answer/busy

## 2020-10-13 ENCOUNTER — Telehealth: Payer: Medicare Other

## 2020-10-13 ENCOUNTER — Telehealth: Payer: Self-pay

## 2020-10-13 ENCOUNTER — Other Ambulatory Visit: Payer: Self-pay | Admitting: Nurse Practitioner

## 2020-10-13 NOTE — Telephone Encounter (Signed)
  Care Management   Follow Up Note   10/13/2020 Name: [name] MRN: [id number] DOB: 10-16-1955   Referred by: [name], [name] Reason for referral : Chronic Care Management (Unsuccessful call)   An unsuccessful telephone outreach was attempted today. The patient was referred to the case management team for assistance with care management and care coordination. The patients contact number is no longer in service.  Follow Up Plan: Performed chart review to note upcomming appointment with primary provider scheduled for August 4th. Collaboration with care team to discuss concern over patients well being based on number of ED visits this month as well as inability to contact the patient. SW to continue to follow.  [name], BSW, CDP Social Worker, Certified Dementia Practitioner [place] [facility] [place] [facility] [phone]

## 2020-10-18 DIAGNOSIS — L814 Other melanin hyperpigmentation: Secondary | ICD-10-CM | POA: Diagnosis not present

## 2020-10-18 DIAGNOSIS — L821 Other seborrheic keratosis: Secondary | ICD-10-CM | POA: Diagnosis not present

## 2020-10-18 DIAGNOSIS — L550 Sunburn of first degree: Secondary | ICD-10-CM | POA: Diagnosis not present

## 2020-10-18 DIAGNOSIS — L309 Dermatitis, unspecified: Secondary | ICD-10-CM | POA: Diagnosis not present

## 2020-10-18 DIAGNOSIS — Z85828 Personal history of other malignant neoplasm of skin: Secondary | ICD-10-CM | POA: Diagnosis not present

## 2020-10-19 ENCOUNTER — Ambulatory Visit: Payer: Medicare Other | Admitting: Nurse Practitioner

## 2020-10-20 ENCOUNTER — Ambulatory Visit (INDEPENDENT_AMBULATORY_CARE_PROVIDER_SITE_OTHER): Payer: Medicare Other

## 2020-10-20 DIAGNOSIS — N1831 Chronic kidney disease, stage 3a: Secondary | ICD-10-CM

## 2020-10-20 DIAGNOSIS — I10 Essential (primary) hypertension: Secondary | ICD-10-CM

## 2020-10-20 DIAGNOSIS — E1121 Type 2 diabetes mellitus with diabetic nephropathy: Secondary | ICD-10-CM
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
## 2020-10-23 ENCOUNTER — Telehealth: Payer: Medicare Other

## 2020-10-23 ENCOUNTER — Telehealth: Payer: Self-pay

## 2020-10-23 NOTE — Chronic Care Management (AMB) (Signed)
Chronic Care Management Pharmacy Assistant   Name: [name]  MRN: [id number] DOB: 1955/10/22   Reason for Encounter: Disease State/ Diabetes  Recent office visits:  None  Recent consult visits:  None  Hospital visits:  Medication Reconciliation was completed by comparing discharge summary, patient's EMR and Pharmacy list, and upon discussion with patient.  Admitted to the hospital on 09-29-2020 due to Dizziness.  Discharge date was 09-29-2020. Discharged from [facility]?Medications Started at [facility] Discharge:?? None  Medication Changes at Hospital Discharge: None  Medications Discontinued at Hospital Discharge: None  Medications that remain the same after Hospital Discharge:??  -All other medications will remain the same.    Hospital visits:  Medication Reconciliation was completed by comparing discharge summary, patient's EMR and Pharmacy list, and upon discussion with patient.  Admitted to the hospital on 10-02-2020 due to depression.  Discharge date was 10-03-2020. Discharged from [place]?Medications Started at [facility] Discharge:?? None  Medication Changes at Hospital Discharge: None  Medications Discontinued at Hospital Discharge: None  Medications that remain the same after Hospital Discharge:??  -All other medications will remain the same.   Medications: Outpatient Encounter Medications as of 10/23/2020  Medication Sig Note   acetaminophen (TYLENOL) 325 MG tablet Take 2 tablets (650 mg total) by mouth every 6 (six) hours as needed for mild pain (or Fever >/= 101). 10/03/2020: As needed   aspirin EC 81 MG tablet Take 81 mg by mouth daily with breakfast.     atorvastatin (LIPITOR) 40 MG tablet 1 tablet by mouth Monday - Friday    clotrimazole-betamethasone (LOTRISONE) cream Apply 1 application topically 2 (two) times daily.    diclofenac Sodium (VOLTAREN) 1 % GEL Apply 4 g topically 4 (four) times daily.  (Patient taking differently: Apply 4 g topically daily as needed.)    metoprolol tartrate (LOPRESSOR) 50 MG tablet Take 1 tablet (50 mg total) by mouth 2 (two) times daily.    MYRBETRIQ 25 MG TB24 tablet TAKE 1 TABLET BY MOUTH EVERY DAY (Patient taking differently: Take 25 mg by mouth daily.)    nystatin (NYSTATIN) powder Apply 1 application topically 3 (three) times daily. (Patient taking differently: Apply 1 application topically 2 (two) times daily as needed (itching/rash).)    olmesartan-hydrochlorothiazide (BENICAR HCT) 40-25 MG tablet Take 1 tablet by mouth daily.    Semaglutide (RYBELSUS) 14 MG TABS Take 1 tablet by mouth daily. 30 minutes before breakfast (Patient taking differently: Take 1 tablet by mouth daily.)    Tetrahydrozoline HCl (VISINE OP) Place 1 drop into both eyes daily as needed (itching/irritation).    traMADol (ULTRAM) 50 MG tablet Take 1 tablet (50 mg total) by mouth every 6 (six) hours as needed for severe pain.    traZODone (DESYREL) 50 MG tablet TAKE 1 TABLET (50 MG TOTAL) BY MOUTH AT BEDTIME AS NEEDED FOR SLEEP.    No facility-administered encounter medications on file as of 10/23/2020.   Recent Relevant Labs: Lab Results  Component Value Date/Time   HGBA1C 8.1 (H) 07/17/2020 02:50 PM   HGBA1C 7.2 (H) 03/13/2020 11:39 AM   HGBA1C 7.6 06/26/2016 12:00 AM   HGBA1C 7.6 06/26/2016 12:00 AM   MICROALBUR 80 08/26/2019 03:15 PM   MICROALBUR 150 03/26/2018 11:57 AM    Kidney Function Lab Results  Component Value Date/Time   CREATININE 1.64 (H) 10/03/2020 02:04 PM   CREATININE 1.39 (H) 09/17/2020 04:52 AM   GFRNONAA 46 (L) 10/03/2020  02:04 PM   GFRAA 48 (L) 03/13/2020 11:39 AM     10-23-2020: 1st attempt unable to receive calls.  Canceled appointment per Orlando Penner. 10-25-2020: 2nd attempt no VM. Patient is in ED  Care Gaps: Ophthalmology exam overdue Pna vac overdue Tdap overdue Shingrix overdue  Star Rating Drugs: Rybelsus 14 mg- Last filled  09-25-2020 [facility] Olmesartan-HCTZ 40-25 mg- Last filled 08-15-2020 [facility] Atorvastatin 40 mg- Last filled 10-04-2020 [facility]  [place] Clinical Pharmacist Assistant [phone]

## 2020-10-24 ENCOUNTER — Telehealth: Payer: Medicare Other

## 2020-10-25 ENCOUNTER — Encounter (HOSPITAL_COMMUNITY): Payer: Self-pay

## 2020-10-25 ENCOUNTER — Emergency Department (HOSPITAL_COMMUNITY): Payer: Medicare Other

## 2020-10-25 ENCOUNTER — Ambulatory Visit: Payer: Self-pay | Admitting: Urology

## 2020-10-25 ENCOUNTER — Inpatient Hospital Stay (HOSPITAL_COMMUNITY): Payer: Medicare Other

## 2020-10-25 ENCOUNTER — Other Ambulatory Visit: Payer: Self-pay

## 2020-10-25 ENCOUNTER — Inpatient Hospital Stay (HOSPITAL_COMMUNITY)
Admission: EM | Admit: 2020-10-25 | Discharge: 2020-10-30 | DRG: 305 | Discharge status: Against Medical Advice | Payer: Medicare Other | Attending: Internal Medicine | Admitting: Internal Medicine

## 2020-10-25 DIAGNOSIS — E1169 Type 2 diabetes mellitus with other specified complication: Secondary | ICD-10-CM | POA: Diagnosis present

## 2020-10-25 DIAGNOSIS — H3530 Unspecified macular degeneration: Secondary | ICD-10-CM | POA: Diagnosis not present

## 2020-10-25 DIAGNOSIS — F10239 Alcohol dependence with withdrawal, unspecified: Secondary | ICD-10-CM | POA: Diagnosis present

## 2020-10-25 DIAGNOSIS — N1831 Chronic kidney disease, stage 3a: Secondary | ICD-10-CM | POA: Diagnosis not present

## 2020-10-25 DIAGNOSIS — E785 Hyperlipidemia, unspecified: Secondary | ICD-10-CM | POA: Diagnosis present

## 2020-10-25 DIAGNOSIS — E1165 Type 2 diabetes mellitus with hyperglycemia: Secondary | ICD-10-CM

## 2020-10-25 DIAGNOSIS — F4323 Adjustment disorder with mixed anxiety and depressed mood: Secondary | ICD-10-CM | POA: Diagnosis present

## 2020-10-25 DIAGNOSIS — Z79899 Other long term (current) drug therapy: Secondary | ICD-10-CM | POA: Diagnosis not present

## 2020-10-25 DIAGNOSIS — Z8249 Family history of ischemic heart disease and other diseases of the circulatory system: Secondary | ICD-10-CM | POA: Diagnosis not present

## 2020-10-25 DIAGNOSIS — N1330 Unspecified hydronephrosis: Secondary | ICD-10-CM | POA: Diagnosis present

## 2020-10-25 DIAGNOSIS — I129 Hypertensive chronic kidney disease with stage 1 through stage 4 chronic kidney disease, or unspecified chronic kidney disease: Secondary | ICD-10-CM | POA: Diagnosis present

## 2020-10-25 DIAGNOSIS — Z20822 Contact with and (suspected) exposure to covid-19: Secondary | ICD-10-CM | POA: Diagnosis not present

## 2020-10-25 DIAGNOSIS — Z8616 Personal history of COVID-19: Secondary | ICD-10-CM

## 2020-10-25 DIAGNOSIS — R296 Repeated falls: Secondary | ICD-10-CM | POA: Diagnosis present

## 2020-10-25 DIAGNOSIS — M109 Gout, unspecified: Secondary | ICD-10-CM | POA: Diagnosis not present

## 2020-10-25 DIAGNOSIS — M545 Low back pain, unspecified: Secondary | ICD-10-CM | POA: Diagnosis present

## 2020-10-25 DIAGNOSIS — Z7984 Long term (current) use of oral hypoglycemic drugs: Secondary | ICD-10-CM | POA: Diagnosis not present

## 2020-10-25 DIAGNOSIS — Z7982 Long term (current) use of aspirin: Secondary | ICD-10-CM

## 2020-10-25 DIAGNOSIS — G8929 Other chronic pain: Secondary | ICD-10-CM

## 2020-10-25 DIAGNOSIS — K219 Gastro-esophageal reflux disease without esophagitis: Secondary | ICD-10-CM | POA: Diagnosis not present

## 2020-10-25 DIAGNOSIS — R3915 Urgency of urination: Secondary | ICD-10-CM | POA: Diagnosis present

## 2020-10-25 DIAGNOSIS — C61 Malignant neoplasm of prostate: Secondary | ICD-10-CM | POA: Diagnosis present

## 2020-10-25 DIAGNOSIS — G319 Degenerative disease of nervous system, unspecified: Secondary | ICD-10-CM | POA: Diagnosis not present

## 2020-10-25 DIAGNOSIS — R42 Dizziness and giddiness: Secondary | ICD-10-CM | POA: Diagnosis not present

## 2020-10-25 DIAGNOSIS — M5459 Other low back pain: Secondary | ICD-10-CM | POA: Diagnosis not present

## 2020-10-25 DIAGNOSIS — R4587 Impulsiveness: Secondary | ICD-10-CM | POA: Diagnosis present

## 2020-10-25 DIAGNOSIS — R338 Other retention of urine: Secondary | ICD-10-CM | POA: Diagnosis present

## 2020-10-25 DIAGNOSIS — R339 Retention of urine, unspecified: Secondary | ICD-10-CM | POA: Diagnosis not present

## 2020-10-25 DIAGNOSIS — E1122 Type 2 diabetes mellitus with diabetic chronic kidney disease: Secondary | ICD-10-CM | POA: Diagnosis not present

## 2020-10-25 DIAGNOSIS — N179 Acute kidney failure, unspecified: Secondary | ICD-10-CM | POA: Diagnosis not present

## 2020-10-25 DIAGNOSIS — R109 Unspecified abdominal pain: Secondary | ICD-10-CM | POA: Diagnosis not present

## 2020-10-25 DIAGNOSIS — N401 Enlarged prostate with lower urinary tract symptoms: Secondary | ICD-10-CM | POA: Diagnosis present

## 2020-10-25 DIAGNOSIS — R319 Hematuria, unspecified: Secondary | ICD-10-CM | POA: Diagnosis present

## 2020-10-25 DIAGNOSIS — I160 Hypertensive urgency: Secondary | ICD-10-CM | POA: Diagnosis not present

## 2020-10-25 LAB — URINALYSIS, ROUTINE W REFLEX MICROSCOPIC
Bacteria, UA: NONE SEEN
Bilirubin Urine: NEGATIVE
Glucose, UA: >=500 mg/dL — AB
Hgb urine dipstick: NEGATIVE
Ketones, ur: NEGATIVE mg/dL
Leukocytes,Ua: NEGATIVE
Nitrite: NEGATIVE
Protein, ur: 100 mg/dL — AB
Specific Gravity, Urine: 1.015 (ref 1.005–1.030)
pH: 6 (ref 5.0–8.0)

## 2020-10-25 LAB — I-STAT CHEM 8, ED
BUN: 31 mg/dL — ABNORMAL HIGH (ref 8–23)
Calcium, Ion: 1.22 mmol/L (ref 1.15–1.40)
Chloride: 103 mmol/L (ref 98–111)
Creatinine, Ser: 1.6 mg/dL — ABNORMAL HIGH (ref 0.61–1.24)
Glucose, Bld: 159 mg/dL — ABNORMAL HIGH (ref 70–99)
HCT: 56 % — ABNORMAL HIGH (ref 39.0–52.0)
Hemoglobin: 19 g/dL — ABNORMAL HIGH (ref 13.0–17.0)
Potassium: 4.5 mmol/L (ref 3.5–5.1)
Sodium: 141 mmol/L (ref 135–145)
TCO2: 26 mmol/L (ref 22–32)

## 2020-10-25 LAB — RESP PANEL BY RT-PCR (FLU A&B, COVID) ARPGX2
Influenza A by PCR: NEGATIVE
Influenza B by PCR: NEGATIVE
SARS Coronavirus 2 by RT PCR: NEGATIVE

## 2020-10-25 LAB — COMPREHENSIVE METABOLIC PANEL
ALT: 33 U/L (ref 0–44)
AST: 22 U/L (ref 15–41)
Albumin: 4.1 g/dL (ref 3.5–5.0)
Alkaline Phosphatase: 101 U/L (ref 38–126)
Anion gap: 13 (ref 5–15)
BUN: 30 mg/dL — ABNORMAL HIGH (ref 8–23)
CO2: 22 mmol/L (ref 22–32)
Calcium: 9.7 mg/dL (ref 8.9–10.3)
Chloride: 104 mmol/L (ref 98–111)
Creatinine, Ser: 1.72 mg/dL — ABNORMAL HIGH (ref 0.61–1.24)
GFR, Estimated: 43 mL/min — ABNORMAL LOW (ref >60)
Glucose, Bld: 156 mg/dL — ABNORMAL HIGH (ref 70–99)
Potassium: 4.6 mmol/L (ref 3.5–5.1)
Sodium: 139 mmol/L (ref 135–145)
Total Bilirubin: 0.4 mg/dL (ref 0.3–1.2)
Total Protein: 7.6 g/dL (ref 6.5–8.1)

## 2020-10-25 LAB — CBC
HCT: 55.9 % — ABNORMAL HIGH (ref 39.0–52.0)
Hemoglobin: 18.5 g/dL — ABNORMAL HIGH (ref 13.0–17.0)
MCH: 28.5 pg (ref 26.0–34.0)
MCHC: 33.1 g/dL (ref 30.0–36.0)
MCV: 86.3 fL (ref 80.0–100.0)
Platelets: 199 10*3/uL (ref 150–400)
RBC: 6.48 MIL/uL — ABNORMAL HIGH (ref 4.22–5.81)
RDW: 13.6 % (ref 11.5–15.5)
WBC: 11.2 10*3/uL — ABNORMAL HIGH (ref 4.0–10.5)
nRBC: 0 % (ref 0.0–0.2)

## 2020-10-25 LAB — CBC WITH DIFFERENTIAL/PLATELET
Abs Immature Granulocytes: 0.06 10*3/uL (ref 0.00–0.07)
Basophils Absolute: 0.1 10*3/uL (ref 0.0–0.1)
Basophils Relative: 1 %
Eosinophils Absolute: 0.1 10*3/uL (ref 0.0–0.5)
Eosinophils Relative: 1 %
HCT: 57.1 % — ABNORMAL HIGH (ref 39.0–52.0)
Hemoglobin: 18.9 g/dL — ABNORMAL HIGH (ref 13.0–17.0)
Immature Granulocytes: 1 %
Lymphocytes Relative: 9 %
Lymphs Abs: 0.9 10*3/uL (ref 0.7–4.0)
MCH: 28.9 pg (ref 26.0–34.0)
MCHC: 33.1 g/dL (ref 30.0–36.0)
MCV: 87.2 fL (ref 80.0–100.0)
Monocytes Absolute: 0.7 10*3/uL (ref 0.1–1.0)
Monocytes Relative: 7 %
Neutro Abs: 8.3 10*3/uL — ABNORMAL HIGH (ref 1.7–7.7)
Neutrophils Relative %: 81 %
Platelets: 215 10*3/uL (ref 150–400)
RBC: 6.55 MIL/uL — ABNORMAL HIGH (ref 4.22–5.81)
RDW: 13.7 % (ref 11.5–15.5)
WBC: 10.1 10*3/uL (ref 4.0–10.5)
nRBC: 0 % (ref 0.0–0.2)

## 2020-10-25 LAB — TROPONIN I (HIGH SENSITIVITY): Troponin I (High Sensitivity): 5 ng/L (ref <18)

## 2020-10-25 LAB — CREATININE, SERUM
Creatinine, Ser: 1.67 mg/dL — ABNORMAL HIGH (ref 0.61–1.24)
GFR, Estimated: 45 mL/min — ABNORMAL LOW (ref >60)

## 2020-10-25 LAB — LIPASE, BLOOD: Lipase: 32 U/L (ref 11–51)

## 2020-10-25 MED ORDER — ATORVASTATIN CALCIUM 40 MG PO TABS
40.0000 mg | ORAL_TABLET | Freq: Every day | ORAL | Status: DC
  Administered 2020-10-26 – 2020-10-29 (×4): 40 mg via ORAL
  Filled 2020-10-25 (×4): qty 1

## 2020-10-25 MED ORDER — TRAMADOL HCL 50 MG PO TABS
50.0000 mg | ORAL_TABLET | Freq: Four times a day (QID) | ORAL | Status: DC | PRN
  Administered 2020-10-25 – 2020-10-29 (×3): 50 mg via ORAL
  Filled 2020-10-25 (×4): qty 1

## 2020-10-25 MED ORDER — LORAZEPAM 2 MG/ML IJ SOLN
1.0000 mg | INTRAMUSCULAR | Status: DC | PRN
  Administered 2020-10-26 – 2020-10-28 (×5): 1 mg via INTRAVENOUS
  Filled 2020-10-25 (×5): qty 1

## 2020-10-25 MED ORDER — HYDROCODONE-ACETAMINOPHEN 5-325 MG PO TABS
1.0000 | ORAL_TABLET | Freq: Once | ORAL | Status: AC
  Administered 2020-10-25: 1 via ORAL
  Filled 2020-10-25: qty 1

## 2020-10-25 MED ORDER — MIRABEGRON ER 25 MG PO TB24
25.0000 mg | ORAL_TABLET | Freq: Every day | ORAL | Status: DC
  Administered 2020-10-26 – 2020-10-29 (×4): 25 mg via ORAL
  Filled 2020-10-25 (×6): qty 1

## 2020-10-25 MED ORDER — HYDRALAZINE HCL 20 MG/ML IJ SOLN
10.0000 mg | Freq: Once | INTRAMUSCULAR | Status: AC
  Administered 2020-10-25: 10 mg via INTRAVENOUS
  Filled 2020-10-25: qty 1

## 2020-10-25 MED ORDER — ENOXAPARIN SODIUM 40 MG/0.4ML IJ SOSY
40.0000 mg | PREFILLED_SYRINGE | INTRAMUSCULAR | Status: DC
  Administered 2020-10-26 – 2020-10-27 (×2): 40 mg via SUBCUTANEOUS
  Filled 2020-10-25 (×3): qty 0.4

## 2020-10-25 MED ORDER — MORPHINE SULFATE (PF) 4 MG/ML IV SOLN
4.0000 mg | Freq: Once | INTRAVENOUS | Status: AC
Start: 2020-10-25 — End: 2020-10-25
  Administered 2020-10-25: 4 mg via INTRAVENOUS
  Filled 2020-10-25: qty 1

## 2020-10-25 MED ORDER — ACETAMINOPHEN 325 MG PO TABS
650.0000 mg | ORAL_TABLET | Freq: Four times a day (QID) | ORAL | Status: DC | PRN
  Administered 2020-10-26: 650 mg via ORAL
  Filled 2020-10-25: qty 2

## 2020-10-25 MED ORDER — MORPHINE SULFATE (PF) 2 MG/ML IV SOLN
2.0000 mg | INTRAVENOUS | Status: DC | PRN
Start: 2020-10-25 — End: 2020-10-30
  Administered 2020-10-25 – 2020-10-27 (×4): 2 mg via INTRAVENOUS
  Filled 2020-10-25 (×4): qty 1

## 2020-10-25 MED ORDER — DICLOFENAC SODIUM 1 % EX GEL
4.0000 g | Freq: Every day | CUTANEOUS | Status: DC | PRN
  Filled 2020-10-25: qty 100

## 2020-10-25 MED ORDER — LABETALOL HCL 5 MG/ML IV SOLN
10.0000 mg | Freq: Once | INTRAVENOUS | Status: AC
  Administered 2020-10-25: 10 mg via INTRAVENOUS
  Filled 2020-10-25: qty 4

## 2020-10-25 MED ORDER — TRAZODONE HCL 50 MG PO TABS
50.0000 mg | ORAL_TABLET | Freq: Every evening | ORAL | Status: DC | PRN
  Administered 2020-10-25 – 2020-10-26 (×2): 50 mg via ORAL
  Filled 2020-10-25 (×2): qty 1

## 2020-10-25 MED ORDER — OLMESARTAN MEDOXOMIL-HCTZ 40-25 MG PO TABS: 1.0000 | ORAL_TABLET | Freq: Every day | ORAL | Status: DC

## 2020-10-25 MED ORDER — ONDANSETRON HCL 4 MG PO TABS: 4.0000 mg | ORAL_TABLET | Freq: Four times a day (QID) | ORAL | Status: DC | PRN

## 2020-10-25 MED ORDER — NYSTATIN 100000 UNIT/GM EX POWD
1.0000 "application " | Freq: Two times a day (BID) | CUTANEOUS | Status: DC | PRN
  Filled 2020-10-25: qty 15

## 2020-10-25 MED ORDER — SILDENAFIL CITRATE 20 MG PO TABS: 20.0000 mg | ORAL_TABLET | Freq: Every day | ORAL | Status: DC | PRN

## 2020-10-25 MED ORDER — ASPIRIN EC 81 MG PO TBEC
81.0000 mg | DELAYED_RELEASE_TABLET | Freq: Every day | ORAL | Status: DC
  Administered 2020-10-26 – 2020-10-30 (×5): 81 mg via ORAL
  Filled 2020-10-25 (×5): qty 1

## 2020-10-25 MED ORDER — HYDROCHLOROTHIAZIDE 25 MG PO TABS
25.0000 mg | ORAL_TABLET | Freq: Every day | ORAL | Status: DC
  Administered 2020-10-26 – 2020-10-29 (×4): 25 mg via ORAL
  Filled 2020-10-25 (×6): qty 1

## 2020-10-25 MED ORDER — IRBESARTAN 150 MG PO TABS
300.0000 mg | ORAL_TABLET | Freq: Every day | ORAL | Status: DC
  Administered 2020-10-26 – 2020-10-29 (×4): 300 mg via ORAL
  Filled 2020-10-25: qty 2
  Filled 2020-10-25: qty 1
  Filled 2020-10-25 (×4): qty 2

## 2020-10-25 MED ORDER — ONDANSETRON HCL 4 MG/2ML IJ SOLN: 4.0000 mg | Freq: Four times a day (QID) | INTRAMUSCULAR | Status: DC | PRN

## 2020-10-25 MED ORDER — CLOTRIMAZOLE-BETAMETHASONE 1-0.05 % EX CREA
1.0000 "application " | TOPICAL_CREAM | Freq: Two times a day (BID) | CUTANEOUS | Status: DC
  Administered 2020-10-26 – 2020-10-29 (×7): 1 via TOPICAL
  Filled 2020-10-25 (×2): qty 15

## 2020-10-25 MED ORDER — TETRAHYDROZOLINE HCL 0.05 % OP SOLN
1.0000 [drp] | Freq: Every day | OPHTHALMIC | Status: DC | PRN
  Filled 2020-10-25: qty 15

## 2020-10-25 MED ORDER — IOHEXOL 350 MG/ML SOLN
75.0000 mL | Freq: Once | INTRAVENOUS | Status: AC | PRN
  Administered 2020-10-25: 75 mL via INTRAVENOUS

## 2020-10-25 NOTE — ED Notes (Signed)
Nurse answered bed alarm and pt had crawled to the end of the bed. He said he needed to go to the bathroom. Nurse saw pt had pulled out his foley. Nurse asked pt about this. He said he had pulled it out accidentally while twisting it back and forth, which multiple staff have told him not to do. Hospitalist notified.

## 2020-10-25 NOTE — ED Provider Notes (Signed)
[facility] DEPT Provider Note   CSN: [id number] Arrival date & time: 10/25/20  1028     History Chief Complaint  Patient presents with   Back Pain   Gait Problem   Hematuria    [name] is a 65 y.o. male.  The history is provided by the patient and medical records. No language interpreter was used.  Back Pain Hematuria   65 year old male with history of prostate cancers, kidney disease, diabetes, hypertension who presents complaining of back pain.  Patient states he has history of prostate cancer but have not received any specific treatment.  He felt that his prostate cancer may have become more advanced causing him to have recurrent back pain.  He endorsed ongoing lower back pain for the past several months as well as having sensation of dizziness and lightheadedness for the same duration.  He endorsed worsening back pain this morning, as well as inability to urinate.  He states he had the urge to pee but unable to pee.  When he does pee in the past few days he has noticed some blood in it.  Back pain is sharp and achy constant moderate to severe.  No fever chills nausea or vomiting.  No history of kidney stone.  Does have history of hypertension and have been taking his blood pressure medication.  States that his oncologist is in [place].  Past Medical History:  Diagnosis Date   Acute kidney injury superimposed on chronic kidney disease ([place]) 02/10/2020   Acute on chronic kidney failure ([place]) 09/28/2019   Closed nondisplaced spiral fracture of shaft of right tibia    Complicated urinary tract infection 09/06/2016   Depression    Diabetes mellitus without complication (HCC)    GERD (gastroesophageal reflux disease)    Gout    right foot   Hemorrhoids    Hypertension    Hypertension    Macular degeneration    Microcytic anemia    Prostate cancer ([place]) 08/2009   Rectal bleeding 07/31/2016   Tubular adenoma     Patient Active Problem List    Diagnosis Date Noted   Adjustment disorder with mixed anxiety and depressed mood    Acute renal failure superimposed on stage 3a chronic kidney disease ([place]) 09/15/2020   Ataxia 08/01/2020   Dizziness 07/31/2020   Chest pain 07/31/2020   Hip pain 07/31/2020   AKI (acute kidney injury) ([place]) 02/11/2020   Hydroureteronephrosis 02/10/2020   Nausea and vomiting 02/10/2020   History of COVID-19 02/10/2020   Pneumonia due to COVID-19 virus 09/29/2019   Hyponatremia 09/28/2019   Sinus tachycardia 09/28/2019   Insomnia 08/26/2018   Nephropathy 03/26/2018   Urinary urgency 03/26/2018   Type 2 diabetes mellitus with diabetic nephropathy, without long-term current use of insulin ([place]) 03/26/2018   Essential hypertension 03/26/2018   Chronic kidney disease (CKD), stage III (moderate) (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   Benign hypertension 02/17/2012   Depressive disorder 02/17/2012    Past Surgical History:  Procedure Laterality Date   CYSTOSCOPY WITH RETROGRADE PYELOGRAM, URETEROSCOPY AND STENT PLACEMENT Right 07/31/2016   Procedure: CYSTOSCOPY WITH RIGHT  RETROGRADE PYELOGRAM, URETEROSCOPY;  Surgeon: [name], MD;  Location: [facility];  Service: Urology;  Laterality: Right;   INGUINAL HERNIA REPAIR  Left ankle joint fusion  1981   PROSTATE BIOPSY     x 2   URETERAL REIMPLANTION  07/31/2016   Procedure: URETERAL REIMPLANT, right boari flap, right psoas hitch;  Surgeon: [name], MD;  Location: [facility];  Service: Urology;;       Family History  Problem Relation Age of Onset   Lung cancer Mother        Deceased   Throat cancer Brother    Pancreatic cancer Father    Heart disease Father        Deceased   Lung  cancer Maternal Uncle        nephew   Diabetes Other     Social History   Tobacco Use   Smoking status: Never   Smokeless tobacco: Never  Vaping Use   Vaping Use: Never used  Substance Use Topics   Alcohol use: Yes    Comment: ocassionally   Drug use: No    Home Medications Prior to Admission medications   Medication Sig Start Date End Date Taking? Authorizing Provider  acetaminophen (TYLENOL) 325 MG tablet Take 2 tablets (650 mg total) by mouth every 6 (six) hours as needed for mild pain (or Fever >/= 101). 02/12/20   Domenic Polite, MD  aspirin EC 81 MG tablet Take 81 mg by mouth daily with breakfast.     [provider]  atorvastatin (LIPITOR) 40 MG tablet 1 tablet by mouth Monday - Friday 10/04/20   [name], [name]  clotrimazole-betamethasone (LOTRISONE) cream Apply 1 application topically 2 (two) times daily. 07/17/20   [name], [name]  diclofenac Sodium (VOLTAREN) 1 % GEL Apply 4 g topically 4 (four) times daily. Patient taking differently: Apply 4 g topically daily as needed. 09/03/20   Palumbo, April, MD  metoprolol tartrate (LOPRESSOR) 50 MG tablet Take 1 tablet (50 mg total) by mouth 2 (two) times daily. 08/03/20   Sheikh, Omair Latif, DO  MYRBETRIQ 25 MG TB24 tablet TAKE 1 TABLET BY MOUTH EVERY DAY Patient taking differently: Take 25 mg by mouth daily. 03/29/20   [name], [name]  nystatin (NYSTATIN) powder Apply 1 application topically 3 (three) times daily. Patient taking differently: Apply 1 application topically 2 (two) times daily as needed (itching/rash). 08/26/19   [name], [name]  olmesartan-hydrochlorothiazide (BENICAR HCT) 40-25 MG tablet Take 1 tablet by mouth daily. 09/20/20   Geradine Girt, DO  Semaglutide (RYBELSUS) 14 MG TABS Take 1 tablet by mouth daily. 30 minutes before breakfast Patient taking differently: Take 1 tablet by mouth daily. 07/17/20   [name], [name]  Tetrahydrozoline HCl (VISINE OP) Place 1 drop into both eyes daily as  needed (itching/irritation).    [provider]  traMADol (ULTRAM) 50 MG tablet Take 1 tablet (50 mg total) by mouth every 6 (six) hours as needed for severe pain. 08/03/20   Raiford Noble Latif, DO  traZODone (DESYREL) 50 MG tablet TAKE 1 TABLET (50 MG TOTAL) BY MOUTH AT BEDTIME AS NEEDED FOR SLEEP. 06/05/20   [name], [name]    Allergies    Patient has no known allergies.  Review of Systems   Review of Systems  Genitourinary:  Positive for hematuria.  Musculoskeletal:  Positive for back pain.  All other systems reviewed and are negative.  Physical Exam Updated Vital Signs BP (!) 169/111   Pulse 84   Temp 98.3 F (36.8 C) (Oral)   Resp 20   Ht '5\' 6"'$  (1.676 m)   Wt 77  kg   SpO2 97%   BMI 27.40 kg/m   Physical Exam Vitals and nursing note reviewed.  Constitutional:      Appearance: He is well-developed.     Comments: Patient appears uncomfortable but nontoxic  HENT:     Head: Atraumatic.  Eyes:     Conjunctiva/sclera: Conjunctivae normal.  Cardiovascular:     Rate and Rhythm: Normal rate and regular rhythm.     Pulses: Normal pulses.     Heart sounds: Normal heart sounds.  Pulmonary:     Effort: Pulmonary effort is normal.     Breath sounds: Normal breath sounds.  Abdominal:     Palpations: Abdomen is soft.     Tenderness: There is abdominal tenderness (Diffuse tenderness without guarding or rebound tenderness).  Genitourinary:    Comments: Chaperone present during exam.  Normal rectal tone, evidence of mild rectal prolapse.  Normal color stool on glove, no obvious mass palpated. Musculoskeletal:     Cervical back: Neck supple.     Right lower leg: No edema.     Left lower leg: No edema.  Skin:    Findings: No rash.  Neurological:     Mental Status: He is alert. Mental status is at baseline.  Psychiatric:        Mood and Affect: Mood normal.    ED Results / Procedures / Treatments   Labs (all labs ordered are listed, but only abnormal results are  displayed) Labs Reviewed - No data to display  EKG None  Radiology No results found.  Procedures Procedures   Medications Ordered in ED Medications  morphine 4 MG/ML injection 4 mg (4 mg Intravenous Given 10/25/20 1355)  labetalol (NORMODYNE) injection 10 mg (10 mg Intravenous Given 10/25/20 1354)  iohexol (OMNIPAQUE) 350 MG/ML injection 75 mL (75 mLs Intravenous Contrast Given 10/25/20 1454)    ED Course  I have reviewed the triage vital signs and the nursing notes.  Pertinent labs & imaging results that were available during my care of the patient were reviewed by me and considered in my medical decision making (see chart for details).    MDM Rules/Calculators/A&P                           BP (!) 162/99 (BP Location: Left Arm)   Pulse 71   Temp 98.3 F (36.8 C) (Oral)   Resp 18   Ht '5\' 6"'$  (1.676 m)   Wt 77 kg   SpO2 98%   BMI 27.40 kg/m   Final Clinical Impression(s) / ED Diagnoses Final diagnoses:  None    Rx / DC Orders ED Discharge Orders     None      1:45 PM Patient report he has history of prostate cancer that is not being managed appropriately as far as he has not really follow-up with his doctor as he should.  He is here with progressive worsening back pain now having abdominal pain with acute onset of urinary retention.  Initial Bladder scan yield a volume of 140 cc however with patient stating that he cannot urinate, will place a Foley. He patient found to be hypertensive with systolic blood pressure in the 200s. This could be due to ongoing pain. Will obtain abd/pelvis CT for further evaluation.    His complaint of lightheadedness and dizziness is chronic in nature.  Doubt dissection.  3:25 PM FOley placed.  Labs are at baseline.  Abd/pelvis CT is currently pending.  Pt sign out to oncoming team who will f/u on CT result and determine disposition.    Domenic Moras, PA-C 10/25/20 1526    Regan Lemming, MD 10/26/20 [phone]

## 2020-10-25 NOTE — ED Notes (Signed)
Bed alarm placed on pt bed and turned on, pt advised to not get out of bed without calling for assistance, slip socks placed on pt as well. Call bell left within reach of pt. Pt advised he understood.

## 2020-10-25 NOTE — ED Provider Notes (Signed)
Care assumed from Domenic Moras, PA-C at shift change pending CT abdomen.  See his note for full HPI.  In short, patient is a 65 year old male who presents to the ED due to acute on chronic low back pain.  Patient has a history of prostate cancer.  Low back pain has been present for the past several months.  Patient also endorses dizziness and lightheadedness.  Patient has had numerous unexplained falls over the past few months.  Patient also admits to urinary retention.  Upon initial evaluation at shift change, patient's BP significantly elevated.  Patient states he has been having headaches associate with blurry vision that have worsened over the past few weeks.  Patient also endorses intermittent chest pain.  Last episode was yesterday. Patient also endorses scrotal pain.   Physical Exam  BP (!) 162/99 (BP Location: Left Arm)   Pulse 71   Temp 98.3 F (36.8 C) (Oral)   Resp 18   Ht '5\' 6"'$  (1.676 m)   Wt 77 kg   SpO2 98%   BMI 27.40 kg/m   Physical Exam Vitals and nursing note reviewed.  Constitutional:      General: He is not in acute distress.    Appearance: He is not ill-appearing.  HENT:     Head: Normocephalic.  Eyes:     Pupils: Pupils are equal, round, and reactive to light.  Cardiovascular:     Rate and Rhythm: Normal rate and regular rhythm.     Pulses: Normal pulses.     Heart sounds: Normal heart sounds. No murmur heard.   No friction rub. No gallop.  Pulmonary:     Effort: Pulmonary effort is normal.     Breath sounds: Normal breath sounds.  Abdominal:     General: Abdomen is flat. There is no distension.     Palpations: Abdomen is soft.     Tenderness: There is no abdominal tenderness. There is no guarding or rebound.  Genitourinary:    Comments: GU exam performed with chaperone in room.  Severe tenderness to bilateral testicles.  Mild overlying erythema. Musculoskeletal:        General: Normal range of motion.     Cervical back: Neck supple.  Skin:    General:  Skin is warm and dry.  Neurological:     General: No focal deficit present.     Mental Status: He is alert.  Psychiatric:        Mood and Affect: Mood normal.        Behavior: Behavior normal.    ED Course/Procedures    Results for orders placed or performed during the hospital encounter of 10/25/20 (from the past 24 hour(s))  CBC with Differential     Status: Abnormal   Collection Time: 10/25/20  1:50 PM  Result Value Ref Range   WBC 10.1 4.0 - 10.5 K/uL   RBC 6.55 (H) 4.22 - 5.81 MIL/uL   Hemoglobin 18.9 (H) 13.0 - 17.0 g/dL   HCT 57.1 (H) 39.0 - 52.0 %   MCV 87.2 80.0 - 100.0 fL   MCH 28.9 26.0 - 34.0 pg   MCHC 33.1 30.0 - 36.0 g/dL   RDW 13.7 11.5 - 15.5 %   Platelets 215 150 - 400 K/uL   nRBC 0.0 0.0 - 0.2 %   Neutrophils Relative % 81 %   Neutro Abs 8.3 (H) 1.7 - 7.7 K/uL   Lymphocytes Relative 9 %   Lymphs Abs 0.9 0.7 - 4.0 K/uL  Monocytes Relative 7 %   Monocytes Absolute 0.7 0.1 - 1.0 K/uL   Eosinophils Relative 1 %   Eosinophils Absolute 0.1 0.0 - 0.5 K/uL   Basophils Relative 1 %   Basophils Absolute 0.1 0.0 - 0.1 K/uL   Immature Granulocytes 1 %   Abs Immature Granulocytes 0.06 0.00 - 0.07 K/uL  Lipase, blood     Status: None   Collection Time: 10/25/20  1:50 PM  Result Value Ref Range   Lipase 32 11 - 51 U/L  Urinalysis, Routine w reflex microscopic Urine, Catheterized     Status: Abnormal   Collection Time: 10/25/20  1:50 PM  Result Value Ref Range   Color, Urine YELLOW YELLOW   APPearance CLEAR CLEAR   Specific Gravity, Urine 1.015 1.005 - 1.030   pH 6.0 5.0 - 8.0   Glucose, UA >=500 (A) NEGATIVE mg/dL   Hgb urine dipstick NEGATIVE NEGATIVE   Bilirubin Urine NEGATIVE NEGATIVE   Ketones, ur NEGATIVE NEGATIVE mg/dL   Protein, ur 100 (A) NEGATIVE mg/dL   Nitrite NEGATIVE NEGATIVE   Leukocytes,Ua NEGATIVE NEGATIVE   RBC / HPF 0-5 0 - 5 RBC/hpf   WBC, UA 0-5 0 - 5 WBC/hpf   Bacteria, UA NONE SEEN NONE SEEN  Troponin I (High Sensitivity)     Status:  None   Collection Time: 10/25/20  1:50 PM  Result Value Ref Range   Troponin I (High Sensitivity) 5 <18 ng/L  Comprehensive metabolic panel     Status: Abnormal   Collection Time: 10/25/20  1:50 PM  Result Value Ref Range   Sodium 139 135 - 145 mmol/L   Potassium 4.6 3.5 - 5.1 mmol/L   Chloride 104 98 - 111 mmol/L   CO2 22 22 - 32 mmol/L   Glucose, Bld 156 (H) 70 - 99 mg/dL   BUN 30 (H) 8 - 23 mg/dL   Creatinine, Ser 1.72 (H) 0.61 - 1.24 mg/dL   Calcium 9.7 8.9 - 10.3 mg/dL   Total Protein 7.6 6.5 - 8.1 g/dL   Albumin 4.1 3.5 - 5.0 g/dL   AST 22 15 - 41 U/L   ALT 33 0 - 44 U/L   Alkaline Phosphatase 101 38 - 126 U/L   Total Bilirubin 0.4 0.3 - 1.2 mg/dL   GFR, Estimated 43 (L) >60 mL/min   Anion gap 13 5 - 15  I-stat chem 8, ED (not at [facility] or [facility])     Status: Abnormal   Collection Time: 10/25/20  2:24 PM  Result Value Ref Range   Sodium 141 135 - 145 mmol/L   Potassium 4.5 3.5 - 5.1 mmol/L   Chloride 103 98 - 111 mmol/L   BUN 31 (H) 8 - 23 mg/dL   Creatinine, Ser 1.60 (H) 0.61 - 1.24 mg/dL   Glucose, Bld 159 (H) 70 - 99 mg/dL   Calcium, Ion 1.22 1.15 - 1.40 mmol/L   TCO2 26 22 - 32 mmol/L   Hemoglobin 19.0 (H) 13.0 - 17.0 g/dL   HCT 56.0 (H) 39.0 - 52.0 %    Procedures  MDM  Care assumed from Domenic Moras, PA-C at shift change pending CT abdomen. See his note for full MDM.  In short, patient is a 65 year old male who presents to the ED due to numerous complaints of acute on chronic low back pain, urinary retention, significant headaches associated with blurry vision, and unsteady gait for the past few months with numerous falls.  Upon arrival, patient afebrile and  tachycardic at 110.  Patient significantly hypertensive 176/109.  Rectal temperature ordered after shift change due to unexplained tachycardia with no fever. Added troponin, CTA head/neck, scrotal US, EKG due to tachycardia,. Low suspicion for fournier's gangrene due to no evidence on CT abdomen/pelvis of gas. I  also added CMP. Patient continued to be severely hypertensive. Patient given '10mg'$  of labetalol by previous provider. Hydralazine given. Discussed case with Dr. Armandina Gemma who evaluated patient at bedside and agrees with assessment and plan. Patient will require admission for further work-up given unexplained falls and dizziness. COVID test ordered. Foley placed by previous provider for urinary retention. CT abdomen personally reviewed which demonstrates:    IMPRESSION:  Stable moderate prostatic enlargement.     Stable right renal cortical atrophy is noted with associated  moderate hydroureteronephrosis which extends to surgical site  midportion of right ureter. Urinary bladder is decompressed  secondary to Foley catheter.     Small bilateral fat containing inguinal hernias.    CTA head and neck ordered due to unexplained dizziness and numerous recent falls over the past few months.  Received phone call from CT who spoke to radiology who notes that patient is unable to receive any further contrast within the next 24 hours due to renal function and previous CT abdomen with contrast.  Patient also is unable to receive CT head without contrast within the next 4 hours because they would prefer contrast to be eliminate from body prior to scan.   Troponin negative. Low suspicion for ACS. Suspect chest pain, headaches, and blurry vision could be related to elevated blood pressures. Scrotal US demonstrates bilateral  hydroceles. No infection.   6:21 PM discussed case with Dr. Jonelle Sidle with [facility] who agrees to admit patient for further evaluation. MRI brain ordered. COVID pending.        Suzy Bouchard, PA-C 10/25/20 1825    Regan Lemming, MD 10/26/20 [phone]

## 2020-10-25 NOTE — ED Notes (Signed)
Pt given urinal and advised to not get out of the bed without calling for assistance, call bed left within reach of pt.

## 2020-10-25 NOTE — H&P (Signed)
History and Physical   DAMIN EBERLE [id number] DOB: [birth year] DOA: 10/25/2020  Referring MD/NP/PA: Dr. Eulis Foster  PCP: [name], [place]   Outpatient Specialists: None  Patient coming from: Home  Chief Complaint: Back pain  HPI: [name] is a 65 y.o. male with medical history significant of prostate cancer, chronic kidney disease, diabetes, hypertension, hyperlipidemia, macular degeneration, who presents to the ER with low back pain, dizziness and lightheadedness.  Patient has had recurrent falls over the last few months.  Also urinary retention.  Foley was inserted.  Patient appears to have markedly elevated blood pressure.  Associated headache and blurry vision.  Patient also has intermittent chest pain.  He denied any fever or chills.  Denied any falls.  Denied any focal weakness.  Patient was admitted and noted to have markedly elevated blood pressure.  Appears to have hypertensive urgency but also has the dizziness and other symptoms worrisome for CVA.  Patient is now awake.  Appears to have recovered a little and responded to initial treatment in the ER.  Blood pressure still elevated but much better.  We are therefore admitting the patient with hypertensive urgency.  ED Course: Temperature 98.7, blood pressure 202/109, pulse 125 respirate 20 oxygen sats 95% on room air.  Sodium 139 potassium 4.9 chloride 104 CO2 22.  Glucose 156.  BUN of 30 creatinine 1.72 calcium 9.7.  White count is 10.1 hemoglobin 18.9 and platelets 215.  Influenza and COVID-19 negative.  Urinalysis essentially negative.  Urine drug screen pending.  CT abdomen pelvis showed moderate prostate enlargement.  Stable right renal cortical atrophy also noted and small bilateral inguinal hernias.  MRI of the brain done currently results pending  Review of Systems: As per HPI otherwise 10 point review of systems negative.    Past Medical History:  Diagnosis Date   Acute kidney injury superimposed on chronic kidney  disease ([place]) 02/10/2020   Acute on chronic kidney failure ([place]) 09/28/2019   Closed nondisplaced spiral fracture of shaft of right tibia    Complicated urinary tract infection 09/06/2016   Depression    Diabetes mellitus without complication (HCC)    GERD (gastroesophageal reflux disease)    Gout    right foot   Hemorrhoids    Hypertension    Hypertension    Macular degeneration    Microcytic anemia    Prostate cancer ([place]) 08/2009   Rectal bleeding 07/31/2016   Tubular adenoma     Past Surgical History:  Procedure Laterality Date   CYSTOSCOPY WITH RETROGRADE PYELOGRAM, URETEROSCOPY AND STENT PLACEMENT Right 07/31/2016   Procedure: CYSTOSCOPY WITH RIGHT  RETROGRADE PYELOGRAM, URETEROSCOPY;  Surgeon: [name], MD;  Location: [facility];  Service: Urology;  Laterality: Right;   INGUINAL HERNIA REPAIR     Left ankle joint fusion  1981   PROSTATE BIOPSY     x 2   URETERAL REIMPLANTION  07/31/2016   Procedure: URETERAL REIMPLANT, right boari flap, right psoas hitch;  Surgeon: [name], MD;  Location: [facility];  Service: Urology;;     reports that he has never smoked. He has never used smokeless tobacco. He reports current alcohol use. He reports that he does not use drugs.  No Known Allergies  Family History  Problem Relation Age of Onset   Lung cancer Mother        Deceased   Throat cancer Brother    Pancreatic cancer Father    Heart disease Father  Deceased   Lung cancer Maternal Uncle        nephew   Diabetes Other      Prior to Admission medications   Medication Sig Start Date End Date Taking? Authorizing Provider  acetaminophen (TYLENOL) 325 MG tablet Take 2 tablets (650 mg total) by mouth every 6 (six) hours as needed for mild pain (or Fever >/= 101). 02/12/20  Yes Domenic Polite, MD  aspirin EC 81 MG tablet Take 81 mg by mouth daily with breakfast.    Yes [provider]  atorvastatin (LIPITOR) 40 MG tablet 1 tablet by mouth Monday -  Friday Patient taking differently: Take 40 mg by mouth daily. 10/04/20  Yes [name], [name]  clotrimazole-betamethasone (LOTRISONE) cream Apply 1 application topically 2 (two) times daily. 07/17/20  Yes [name], [name]  diclofenac Sodium (VOLTAREN) 1 % GEL Apply 4 g topically 4 (four) times daily. Patient taking differently: Apply 4 g topically daily as needed. 09/03/20  Yes Palumbo, April, MD  linagliptin (TRADJENTA) 5 MG TABS tablet Take 5 mg by mouth daily.   Yes [provider]  MYRBETRIQ 25 MG TB24 tablet TAKE 1 TABLET BY MOUTH EVERY DAY Patient taking differently: Take 25 mg by mouth daily. 03/29/20  Yes [name], [name]  nystatin (NYSTATIN) powder Apply 1 application topically 3 (three) times daily. Patient taking differently: Apply 1 application topically 2 (two) times daily as needed (itching/rash). 08/26/19  Yes [name], [name]  olmesartan-hydrochlorothiazide (BENICAR HCT) 40-25 MG tablet Take 1 tablet by mouth daily. 09/20/20  Yes Vann, Jessica U, DO  sildenafil (REVATIO) 20 MG tablet Take 20-100 mg by mouth daily as needed for erectile dysfunction. 10/09/20  Yes [provider]  Tetrahydrozoline HCl (VISINE OP) Place 1 drop into both eyes daily as needed (itching/irritation).   Yes [provider]  traMADol (ULTRAM) 50 MG tablet Take 1 tablet (50 mg total) by mouth every 6 (six) hours as needed for severe pain. 08/03/20  Yes Sheikh, Omair Latif, DO  traZODone (DESYREL) 50 MG tablet TAKE 1 TABLET (50 MG TOTAL) BY MOUTH AT BEDTIME AS NEEDED FOR SLEEP. 06/05/20  Yes [name], [name]  metoprolol tartrate (LOPRESSOR) 50 MG tablet Take 1 tablet (50 mg total) by mouth 2 (two) times daily. Patient not taking: Reported on 10/25/2020 08/03/20   Raiford Noble Latif, DO  Semaglutide (RYBELSUS) 14 MG TABS Take 1 tablet by mouth daily. 30 minutes before breakfast Patient not taking: No sig reported 07/17/20   [name], [name]    Physical Exam: Vitals:   10/25/20 1800  10/25/20 1830 10/25/20 1845 10/25/20 2008  BP: (!) 166/109 (!) 199/120 (!) 163/109 (!) 160/105  Pulse: 75 93 66 87  Resp: '14 18 15 15  '$ Temp:    98.6 F (37 C)  TempSrc:    Oral  SpO2: 95% 96% 99% 99%  Weight:      Height:          Constitutional: Acutely ill looking, mild agitation Vitals:   10/25/20 1800 10/25/20 1830 10/25/20 1845 10/25/20 2008  BP: (!) 166/109 (!) 199/120 (!) 163/109 (!) 160/105  Pulse: 75 93 66 87  Resp: '14 18 15 15  '$ Temp:    98.6 F (37 C)  TempSrc:    Oral  SpO2: 95% 96% 99% 99%  Weight:      Height:       Eyes: PERRL, lids and conjunctivae normal ENMT: Mucous membranes are dry. Posterior pharynx clear of any exudate or lesions.Normal  dentition.  Neck: normal, supple, no masses, no thyromegaly Respiratory: clear to auscultation bilaterally, no wheezing, no crackles. Normal respiratory effort. No accessory muscle use.  Cardiovascular: Regular rate and rhythm, no murmurs / rubs / gallops. No extremity edema. 2+ pedal pulses. No carotid bruits.  Abdomen: no tenderness, no masses palpated. No hepatosplenomegaly. Bowel sounds positive.  Musculoskeletal: no clubbing / cyanosis. No joint deformity upper and lower extremities. Good ROM, no contractures. Normal muscle tone.  Skin: no rashes, lesions, ulcers. No induration Neurologic: CN 2-12 grossly intact. Sensation intact, DTR normal. Strength 5/5 in all 4.  Psychiatric: Confused, pulling at his Foley.    Labs on Admission: I have personally reviewed following labs and imaging studies  CBC: Recent Labs  Lab 10/25/20 1350 10/25/20 1424 10/25/20 2010  WBC 10.1  --  11.2*  NEUTROABS 8.3*  --   --   HGB 18.9* 19.0* 18.5*  HCT 57.1* 56.0* 55.9*  MCV 87.2  --  86.3  PLT 215  --  [id number]   Basic Metabolic Panel: Recent Labs  Lab 10/25/20 1350 10/25/20 1424 10/25/20 2010  NA 139 141  --   K 4.6 4.5  --   CL 104 103  --   CO2 22  --   --   GLUCOSE 156* 159*  --   BUN 30* 31*  --   CREATININE 1.72*  1.60* 1.67*  CALCIUM 9.7  --   --    GFR: Estimated Creatinine Clearance: 42.5 mL/min (A) (by C-G formula based on SCr of 1.67 mg/dL (H)). Liver Function Tests: Recent Labs  Lab 10/25/20 1350  AST 22  ALT 33  ALKPHOS 101  BILITOT 0.4  PROT 7.6  ALBUMIN 4.1   Recent Labs  Lab 10/25/20 1350  LIPASE 32   No results for input(s): AMMONIA in the last 168 hours. Coagulation Profile: No results for input(s): INR, PROTIME in the last 168 hours. Cardiac Enzymes: No results for input(s): CKTOTAL, CKMB, CKMBINDEX, TROPONINI in the last 168 hours. BNP (last 3 results) No results for input(s): PROBNP in the last 8760 hours. HbA1C: No results for input(s): HGBA1C in the last 72 hours. CBG: No results for input(s): GLUCAP in the last 168 hours. Lipid Profile: No results for input(s): CHOL, HDL, LDLCALC, TRIG, CHOLHDL, LDLDIRECT in the last 72 hours. Thyroid Function Tests: No results for input(s): TSH, T4TOTAL, FREET4, T3FREE, THYROIDAB in the last 72 hours. Anemia Panel: No results for input(s): VITAMINB12, FOLATE, FERRITIN, TIBC, IRON, RETICCTPCT in the last 72 hours. Urine analysis:    Component Value Date/Time   COLORURINE YELLOW 10/25/2020 1350   APPEARANCEUR CLEAR 10/25/2020 1350   LABSPEC 1.015 10/25/2020 1350   PHURINE 6.0 10/25/2020 1350   GLUCOSEU >=500 (A) 10/25/2020 1350   HGBUR NEGATIVE 10/25/2020 1350   BILIRUBINUR NEGATIVE 10/25/2020 1350   BILIRUBINUR negative 08/26/2019 1516   KETONESUR NEGATIVE 10/25/2020 1350   PROTEINUR 100 (A) 10/25/2020 1350   UROBILINOGEN 0.2 08/26/2019 1516   UROBILINOGEN 0.2 04/18/2008 1153   NITRITE NEGATIVE 10/25/2020 1350   LEUKOCYTESUR NEGATIVE 10/25/2020 1350   Sepsis Labs: '@LABRCNTIP'$ (procalcitonin:4,lacticidven:4) ) Recent Results (from the past 240 hour(s))  Resp Panel by RT-PCR (Flu A&B, Covid) Nasopharyngeal Swab     Status: None   Collection Time: 10/25/20  4:51 PM   Specimen: Nasopharyngeal Swab; Nasopharyngeal(NP)  swabs in vial transport medium  Result Value Ref Range Status   SARS Coronavirus 2 by RT PCR NEGATIVE NEGATIVE Final    Comment: (NOTE) SARS-CoV-2 target nucleic  acids are NOT DETECTED.  The SARS-CoV-2 RNA is generally detectable in upper respiratory specimens during the acute phase of infection. The lowest concentration of SARS-CoV-2 viral copies this assay can detect is 138 copies/mL. A negative result does not preclude SARS-Cov-2 infection and should not be used as the sole basis for treatment or other patient management decisions. A negative result may occur with  improper specimen collection/handling, submission of specimen other than nasopharyngeal swab, presence of viral mutation(s) within the areas targeted by this assay, and inadequate number of viral copies(<138 copies/mL). A negative result must be combined with clinical observations, patient history, and epidemiological information. The expected result is Negative.  Fact Sheet for Patients:  EntrepreneurPulse.com.au  Fact Sheet for Healthcare Providers:  IncredibleEmployment.be  This test is no t yet approved or cleared by the [place] FDA and  has been authorized for detection and/or diagnosis of SARS-CoV-2 by FDA under an Emergency Use Authorization (EUA). This EUA will remain  in effect (meaning this test can be used) for the duration of the COVID-19 declaration under Section 564(b)(1) of the Act, 21 U.S.C.section 360bbb-3(b)(1), unless the authorization is terminated  or revoked sooner.       Influenza A by PCR NEGATIVE NEGATIVE Final   Influenza B by PCR NEGATIVE NEGATIVE Final    Comment: (NOTE) The Xpert Xpress SARS-CoV-2/FLU/RSV plus assay is intended as an aid in the diagnosis of influenza from Nasopharyngeal swab specimens and should not be used as a sole basis for treatment. Nasal washings and aspirates are unacceptable for Xpert Xpress  SARS-CoV-2/FLU/RSV testing.  Fact Sheet for Patients: EntrepreneurPulse.com.au  Fact Sheet for Healthcare Providers: IncredibleEmployment.be  This test is not yet approved or cleared by the [place] FDA and has been authorized for detection and/or diagnosis of SARS-CoV-2 by FDA under an Emergency Use Authorization (EUA). This EUA will remain in effect (meaning this test can be used) for the duration of the COVID-19 declaration under Section 564(b)(1) of the Act, 21 U.S.C. section 360bbb-3(b)(1), unless the authorization is terminated or revoked.  Performed at [facility], [place] [address] [place], [place] [address]      Radiological Exams on Admission: CT Abdomen Pelvis W Contrast  Result Date: 10/25/2020 CLINICAL DATA:  Acute generalized abdominal pain. History of prostate cancer. EXAM: CT ABDOMEN AND PELVIS WITH CONTRAST TECHNIQUE: Multidetector CT imaging of the abdomen and pelvis was performed using the standard protocol following bolus administration of intravenous contrast. CONTRAST:  66m OMNIPAQUE IOHEXOL 350 MG/ML SOLN COMPARISON:  February 10, 2020. FINDINGS: Lower chest: No acute abnormality. Hepatobiliary: No focal liver abnormality is seen. No gallstones, gallbladder wall thickening, or biliary dilatation. Pancreas: Unremarkable. No pancreatic ductal dilatation or surrounding inflammatory changes. Spleen: Normal in size without focal abnormality. Adrenals/Urinary Tract: Adrenal glands appear normal. Stable left renal cysts are noted. Stable right renal cortical atrophy is noted with moderate hydroureteronephrosis which extends to surgical site in midportion of right ureter. Urinary bladder is decompressed secondary to Foley catheter. No renal or ureteral calculi are noted. Stomach/Bowel: Stomach is within normal limits. Appendix appears normal. No evidence of bowel wall thickening, distention, or inflammatory changes.  Vascular/Lymphatic: Aortic atherosclerosis. No enlarged abdominal or pelvic lymph nodes. Reproductive: Stable moderate prostatic enlargement is noted. Other: Small bilateral fat containing inguinal hernias are noted. No ascites is noted. Musculoskeletal: No acute or significant osseous findings. IMPRESSION: Stable moderate prostatic enlargement. Stable right renal cortical atrophy is noted with associated moderate hydroureteronephrosis which extends to surgical site midportion of  right ureter. Urinary bladder is decompressed secondary to Foley catheter. Small bilateral fat containing inguinal hernias. Aortic Atherosclerosis (ICD10-I70.0). Electronically Signed   By: Marijo Conception M.D.   On: 10/25/2020 15:40   US SCROTUM W/DOPPLER  Result Date: 10/25/2020 CLINICAL DATA:  Left greater than right scrotal pain EXAM: SCROTAL ULTRASOUND DOPPLER ULTRASOUND OF THE TESTICLES TECHNIQUE: Complete ultrasound examination of the testicles, epididymis, and other scrotal structures was performed. Color and spectral Doppler ultrasound were also utilized to evaluate blood flow to the testicles. COMPARISON:  Ultrasound 09/15/2020 FINDINGS: Right testicle Measurements: 3.6 x 2.5 x 2.7 cm. No mass or microlithiasis visualized. Left testicle Measurements: 4.2 x 2.3 x 3 cm. No mass or microlithiasis visualized. Right epididymis:  Normal in size and appearance. Left epididymis:  Normal in size and appearance. Hydrocele: Small bilateral hydroceles. Particulate debris within the hydroceles. Varicocele:  None visualized. Pulsed Doppler interrogation of both testes demonstrates normal low resistance arterial and venous waveforms bilaterally. IMPRESSION: 1. Small bilateral hydroceles. 2. Negative for testicular torsion Electronically Signed   By: Donavan Foil M.D.   On: 10/25/2020 17:55    EKG: Independently reviewed.  Normal sinus rhythm no significant ST changes  Assessment/Plan Principal Problem:   Hypertensive urgency,  malignant Active Problems:   Dyslipidemia associated with type 2 diabetes mellitus (HCC)   Urinary urgency   Dizziness   Acute renal failure superimposed on stage 3a chronic kidney disease (HCC)   Adjustment disorder with mixed anxiety and depressed mood   Acute urinary retention     #1 hypertensive urgency: Patient will be admitted.  Initiate home regimen.  He was on beta-blocker but stopped.  Will resume beta-blockers as well as his as needed labetalol.  Blood pressure already slowly improving.  #2 urinary retention: Foley catheter in place.  No evidence of UTI.  Patient has removed Foley earlier.  Counseling provided.  Slight agitation.  We will give some Ativan.  #3 adjustment disorder with anxiety: As needed Ativan.  Counseling.  #4 dizziness with lightheadedness: MRI of the brain ordered.  Will follow results.  #5 diabetes: Non-insulin-dependent.  Sliding scale insulin.  #6 acute on chronic kidney disease stage IIIa: Hydrate and follow renal function  #7 hyperlipidemia: Continue statin  #8 history of prostate cancer: Defer treatment to oncology  #9 significant low back pain: No evidence of mets.  Symptomatic treatment.  DVT prophylaxis: Heparin Code Status: Full code Family Communication: No family at bedside Disposition Plan: To be determined Consults called: None Admission status: Inpatient  Severity of Illness: The appropriate patient status for this patient is INPATIENT. Inpatient status is judged to be reasonable and necessary in order to provide the required intensity of service to ensure the patient's safety. The patient's presenting symptoms, physical exam findings, and initial radiographic and laboratory data in the context of their chronic comorbidities is felt to place them at high risk for further clinical deterioration. Furthermore, it is not anticipated that the patient will be medically stable for discharge from the hospital within 2 midnights of admission.  The following factors support the patient status of inpatient.   " The patient's presenting symptoms include urinary symptoms with hematuria. " The worrisome physical exam findings include mild confusion. " The initial radiographic and laboratory data are worrisome because of CT findings of enlarged prostate. " The chronic co-morbidities include history of prostate cancer.   * I certify that at the point of admission it is my clinical judgment that the patient will require inpatient hospital  care spanning beyond 2 midnights from the point of admission due to high intensity of service, high risk for further deterioration and high frequency of surveillance required.Barbette Merino MD [facility] Pager [phone]  If 7PM-7AM, please contact night-coverage [email or web address] Password [email or web address]  10/25/2020, 11:12 PM

## 2020-10-25 NOTE — Discharge Instructions (Signed)
You have been evaluated for your symptoms.  Fortunately your CT scan did not show any acute finding.  Please follow-up closely with urology in the next 3 to 5 days for reassessments of your urinary retention.  Your blood pressure is high today, please have it rechecked by your primary care doctor at your earliest convenience.  Return if you have any concern.

## 2020-10-25 NOTE — ED Notes (Signed)
Bladder scan showed a volume reading of >140.

## 2020-10-25 NOTE — ED Notes (Signed)
Per MD Jonelle Sidle, pt can receive new foley once he is admitted upstairs.

## 2020-10-25 NOTE — ED Notes (Addendum)
Patients bed alarm was going off. Patient was standing at the end of the bed and is refusing to lay back down stating that his back hurts. Hassell Done NT came to help and asked the nurse if patient could get a recliner. RN declined. Patient is sitting on the end of bed. RN then came in with IV supplies and the patient agreed to get back in bed. RN is with patient now.

## 2020-10-25 NOTE — ED Triage Notes (Signed)
Patient complaining of unsteady gain since may. Patient denies fall/dizziness. Patient reports chronic lower back pain and hematuria due to prostate cancer

## 2020-10-25 NOTE — ED Notes (Signed)
Patients bed alarm was going off so the RN and I walked in and patient was on the floor. He stated he slipped and fell while trying to get up and use the bathroom. Patient was assisted getting back in the bed and a new set of vitals were taken. Patient was placed on a [employer], his bed alarm was set again, and his call bell was placed within reach. Patient was advised that he can not get up.

## 2020-10-26 ENCOUNTER — Ambulatory Visit: Payer: Self-pay

## 2020-10-26 ENCOUNTER — Telehealth: Payer: Medicare Other

## 2020-10-26 DIAGNOSIS — R338 Other retention of urine: Secondary | ICD-10-CM

## 2020-10-26 DIAGNOSIS — E1121 Type 2 diabetes mellitus with diabetic nephropathy: Secondary | ICD-10-CM

## 2020-10-26 DIAGNOSIS — I160 Hypertensive urgency: Secondary | ICD-10-CM

## 2020-10-26 DIAGNOSIS — I10 Essential (primary) hypertension: Secondary | ICD-10-CM

## 2020-10-26 LAB — COMPREHENSIVE METABOLIC PANEL
ALT: 26 U/L (ref 0–44)
AST: 23 U/L (ref 15–41)
Albumin: 3.8 g/dL (ref 3.5–5.0)
Alkaline Phosphatase: 90 U/L (ref 38–126)
Anion gap: 11 (ref 5–15)
BUN: 29 mg/dL — ABNORMAL HIGH (ref 8–23)
CO2: 22 mmol/L (ref 22–32)
Calcium: 9.3 mg/dL (ref 8.9–10.3)
Chloride: 103 mmol/L (ref 98–111)
Creatinine, Ser: 1.61 mg/dL — ABNORMAL HIGH (ref 0.61–1.24)
GFR, Estimated: 47 mL/min — ABNORMAL LOW (ref >60)
Glucose, Bld: 217 mg/dL — ABNORMAL HIGH (ref 70–99)
Potassium: 4.5 mmol/L (ref 3.5–5.1)
Sodium: 136 mmol/L (ref 135–145)
Total Bilirubin: 1.1 mg/dL (ref 0.3–1.2)
Total Protein: 7.1 g/dL (ref 6.5–8.1)

## 2020-10-26 LAB — CBC
HCT: 58.2 % — ABNORMAL HIGH (ref 39.0–52.0)
Hemoglobin: 18.4 g/dL — ABNORMAL HIGH (ref 13.0–17.0)
MCH: 28.7 pg (ref 26.0–34.0)
MCHC: 31.6 g/dL (ref 30.0–36.0)
MCV: 90.7 fL (ref 80.0–100.0)
Platelets: 166 10*3/uL (ref 150–400)
RBC: 6.42 MIL/uL — ABNORMAL HIGH (ref 4.22–5.81)
RDW: 14.3 % (ref 11.5–15.5)
WBC: 8.8 10*3/uL (ref 4.0–10.5)
nRBC: 0 % (ref 0.0–0.2)

## 2020-10-26 LAB — GLUCOSE, CAPILLARY
Glucose-Capillary: 178 mg/dL — ABNORMAL HIGH (ref 70–99)
Glucose-Capillary: 225 mg/dL — ABNORMAL HIGH (ref 70–99)
Glucose-Capillary: 158 mg/dL — ABNORMAL HIGH (ref 70–99)
Glucose-Capillary: 239 mg/dL — ABNORMAL HIGH (ref 70–99)

## 2020-10-26 LAB — HIV ANTIBODY (ROUTINE TESTING W REFLEX): HIV Screen 4th Generation wRfx: NONREACTIVE

## 2020-10-26 MED ORDER — INSULIN ASPART 100 UNIT/ML IJ SOLN
0.0000 [IU] | Freq: Every day | INTRAMUSCULAR | Status: DC
Start: 2020-10-26 — End: 2020-10-30
  Administered 2020-10-26: 2 [IU] via SUBCUTANEOUS

## 2020-10-26 MED ORDER — INSULIN ASPART 100 UNIT/ML IJ SOLN
0.0000 [IU] | Freq: Three times a day (TID) | INTRAMUSCULAR | Status: DC
Start: 2020-10-26 — End: 2020-10-30
  Administered 2020-10-26: 2 [IU] via SUBCUTANEOUS
  Administered 2020-10-26: 3 [IU] via SUBCUTANEOUS
  Administered 2020-10-27 (×2): 2 [IU] via SUBCUTANEOUS
  Administered 2020-10-27: 1 [IU] via SUBCUTANEOUS
  Administered 2020-10-28: 2 [IU] via SUBCUTANEOUS
  Administered 2020-10-28: 1 [IU] via SUBCUTANEOUS
  Administered 2020-10-28: 5 [IU] via SUBCUTANEOUS
  Administered 2020-10-29: 1 [IU] via SUBCUTANEOUS
  Administered 2020-10-29: 5 [IU] via SUBCUTANEOUS
  Administered 2020-10-30: 1 [IU] via SUBCUTANEOUS

## 2020-10-26 MED ORDER — CHLORHEXIDINE GLUCONATE CLOTH 2 % EX PADS
6.0000 | MEDICATED_PAD | Freq: Every day | CUTANEOUS | Status: DC
  Administered 2020-10-26 – 2020-10-29 (×4): 6 via TOPICAL

## 2020-10-26 MED ORDER — METOPROLOL TARTRATE 50 MG PO TABS
50.0000 mg | ORAL_TABLET | Freq: Two times a day (BID) | ORAL | Status: DC
  Administered 2020-10-26 – 2020-10-29 (×6): 50 mg via ORAL
  Filled 2020-10-26 (×7): qty 1

## 2020-10-26 NOTE — Progress Notes (Signed)
PROGRESS NOTE    [name]  [id number] DOB: 11-Mar-1956 DOA: 10/25/2020 PCP: [name], [name]    Brief Narrative:  65 year old gentleman with history of prostate cancer with recurrence, intermittent urinary retention, CKD stage III AAA, type 2 diabetes on oral hypoglycemics, hyperlipidemia, macular degeneration and hypertension presented to the emergency room with low back pain, dizziness and lightheadedness.  Also with frequent falls at home.  In the emergency room blood pressure 202/109.  Heart rate 125.  95% room air.  Influenza and COVID-19 negative.  Urinalysis normal.  CT scan abdomen pelvis showed moderate prostate enlargement.  MRI of the brain was done that was essentially normal for any acute findings.  Admitted to hospital with elevated blood pressures and urinary obstruction.   Assessment & Plan:   Principal Problem:   Hypertensive urgency, malignant Active Problems:   Dyslipidemia associated with type 2 diabetes mellitus (HCC)   Urinary urgency   Dizziness   Acute renal failure superimposed on stage 3a chronic kidney disease (HCC)   Adjustment disorder with mixed anxiety and depressed mood   Acute urinary retention  Hypertensive urgency: Presentation blood pressure more than 200.  Treated with as needed medications and resumed his home doses of Benicar and beta-blocker.  Blood pressure already appropriately decreasing.  Will monitor.  Will not need more blood pressure medications. With concern about dizziness, MRI of the brain was done that is incessantly normal.  Recurrent urinary obstruction: Due to prostatomegaly from prostate cancer.  He is doing occasional straight cath at home.  Does not have enough support system.  Failed voiding trial.  We will do a straight cath overnight, if he retains will discharge with Foley catheter.  He is a scheduled to follow-up with his urologist next week.  AKI on CKD3a: Due to #1.  Adequately improved to his baseline.  Creatinine  1.6.  Type 2 diabetes with CKD3a: On semaglutide and Tradjenta at home.  Well controlled.  Currently remains on sliding scale insulin.  Continue.  Anxiety/ depression: Uses trazodone at night.  Continued.  Deconditioning, falls: Patient had multiple episodes of fall due to ongoing deconditioning.  Will work with PT OT before discharge.  All-time fall precautions.  DVT prophylaxis: enoxaparin (LOVENOX) injection 40 mg Start: 10/25/20 2000   Code Status: Full code Family Communication: Sister on the phone Disposition Plan: Status is: Inpatient  Remains inpatient appropriate because:Inpatient level of care appropriate due to severity of illness  Dispo: The patient is from: Home              Anticipated d/c is to: Home              Patient currently is not medically stable to d/c.   Difficult to place patient No         Consultants:  None  Procedures:  None  Antimicrobials:  None   Subjective: Patient seen and examined.  No overnight events.  Poor historian overall.  Tells me that he is having trouble to urinate after removing Foley catheter. He denies any trouble walking but keeps falling at times. Lives by himself, is not able to go home today.  Objective: Vitals:   10/26/20 0356 10/26/20 1115 10/26/20 1153 10/26/20 1337  BP: 99/65 (!) 146/84 (!) 146/84 (!) 150/95  Pulse: (!) 58 84 84 98  Resp: 17   (!) 21  Temp: 98.5 F (36.9 C)   98 F (36.7 C)  TempSrc: Oral   Oral  SpO2: 98%  98%  Weight:      Height:        Intake/Output Summary (Last 24 hours) at 10/26/2020 1637 Last data filed at 10/26/2020 1300 Gross per 24 hour  Intake 360 ml  Output 1150 ml  Net -790 ml   Filed Weights   10/25/20 1038  Weight: 77 kg    Examination:  General exam: Appears calm and comfortable  Not in any distress.  On room air. Respiratory system: Clear to auscultation. Respiratory effort normal. Cardiovascular system: S1 & S2 heard, RRR. No JVD, murmurs, rubs, gallops  or clicks. No pedal edema. Gastrointestinal system: Abdomen is nondistended, soft and nontender. No organomegaly or masses felt. Normal bowel sounds heard. Central nervous system: Alert and oriented. No focal neurological deficits. Extremities: Symmetric 5 x 5 power. Skin: No rashes, lesions or ulcers Psychiatry: Judgement and insight appear normal.  Flat affect.    Data Reviewed: I have personally reviewed following labs and imaging studies  CBC: Recent Labs  Lab 10/25/20 1350 10/25/20 1424 10/25/20 2010 10/26/20 0506  WBC 10.1  --  11.2* 8.8  NEUTROABS 8.3*  --   --   --   HGB 18.9* 19.0* 18.5* 18.4*  HCT 57.1* 56.0* 55.9* 58.2*  MCV 87.2  --  86.3 90.7  PLT 215  --  199 [id number]   Basic Metabolic Panel: Recent Labs  Lab 10/25/20 1350 10/25/20 1424 10/25/20 2010 10/26/20 0506  NA 139 141  --  136  K 4.6 4.5  --  4.5  CL 104 103  --  103  CO2 22  --   --  22  GLUCOSE 156* 159*  --  217*  BUN 30* 31*  --  29*  CREATININE 1.72* 1.60* 1.67* 1.61*  CALCIUM 9.7  --   --  9.3   GFR: Estimated Creatinine Clearance: 44.1 mL/min (A) (by C-G formula based on SCr of 1.61 mg/dL (H)). Liver Function Tests: Recent Labs  Lab 10/25/20 1350 10/26/20 0506  AST 22 23  ALT 33 26  ALKPHOS 101 90  BILITOT 0.4 1.1  PROT 7.6 7.1  ALBUMIN 4.1 3.8   Recent Labs  Lab 10/25/20 1350  LIPASE 32   No results for input(s): AMMONIA in the last 168 hours. Coagulation Profile: No results for input(s): INR, PROTIME in the last 168 hours. Cardiac Enzymes: No results for input(s): CKTOTAL, CKMB, CKMBINDEX, TROPONINI in the last 168 hours. BNP (last 3 results) No results for input(s): PROBNP in the last 8760 hours. HbA1C: No results for input(s): HGBA1C in the last 72 hours. CBG: Recent Labs  Lab 10/26/20 0746 10/26/20 1141 10/26/20 1629  GLUCAP 178* 225* 158*   Lipid Profile: No results for input(s): CHOL, HDL, LDLCALC, TRIG, CHOLHDL, LDLDIRECT in the last 72 hours. Thyroid  Function Tests: No results for input(s): TSH, T4TOTAL, FREET4, T3FREE, THYROIDAB in the last 72 hours. Anemia Panel: No results for input(s): VITAMINB12, FOLATE, FERRITIN, TIBC, IRON, RETICCTPCT in the last 72 hours. Sepsis Labs: No results for input(s): PROCALCITON, LATICACIDVEN in the last 168 hours.  Recent Results (from the past 240 hour(s))  Resp Panel by RT-PCR (Flu A&B, Covid) Nasopharyngeal Swab     Status: None   Collection Time: 10/25/20  4:51 PM   Specimen: Nasopharyngeal Swab; Nasopharyngeal(NP) swabs in vial transport medium  Result Value Ref Range Status   SARS Coronavirus 2 by RT PCR NEGATIVE NEGATIVE Final    Comment: (NOTE) SARS-CoV-2 target nucleic acids are NOT DETECTED.  The SARS-CoV-2 RNA  is generally detectable in upper respiratory specimens during the acute phase of infection. The lowest concentration of SARS-CoV-2 viral copies this assay can detect is 138 copies/mL. A negative result does not preclude SARS-Cov-2 infection and should not be used as the sole basis for treatment or other patient management decisions. A negative result may occur with  improper specimen collection/handling, submission of specimen other than nasopharyngeal swab, presence of viral mutation(s) within the areas targeted by this assay, and inadequate number of viral copies(<138 copies/mL). A negative result must be combined with clinical observations, patient history, and epidemiological information. The expected result is Negative.  Fact Sheet for Patients:  EntrepreneurPulse.com.au  Fact Sheet for Healthcare Providers:  IncredibleEmployment.be  This test is no t yet approved or cleared by the [place] FDA and  has been authorized for detection and/or diagnosis of SARS-CoV-2 by FDA under an Emergency Use Authorization (EUA). This EUA will remain  in effect (meaning this test can be used) for the duration of the COVID-19 declaration under  Section 564(b)(1) of the Act, 21 U.S.C.section 360bbb-3(b)(1), unless the authorization is terminated  or revoked sooner.       Influenza A by PCR NEGATIVE NEGATIVE Final   Influenza B by PCR NEGATIVE NEGATIVE Final    Comment: (NOTE) The Xpert Xpress SARS-CoV-2/FLU/RSV plus assay is intended as an aid in the diagnosis of influenza from Nasopharyngeal swab specimens and should not be used as a sole basis for treatment. Nasal washings and aspirates are unacceptable for Xpert Xpress SARS-CoV-2/FLU/RSV testing.  Fact Sheet for Patients: EntrepreneurPulse.com.au  Fact Sheet for Healthcare Providers: IncredibleEmployment.be  This test is not yet approved or cleared by the [place] FDA and has been authorized for detection and/or diagnosis of SARS-CoV-2 by FDA under an Emergency Use Authorization (EUA). This EUA will remain in effect (meaning this test can be used) for the duration of the COVID-19 declaration under Section 564(b)(1) of the Act, 21 U.S.C. section 360bbb-3(b)(1), unless the authorization is terminated or revoked.  Performed at [facility], [place] [address], [place], [place] [address]          Radiology Studies: MR BRAIN WO CONTRAST  Result Date: 10/26/2020 CLINICAL DATA:  Initial evaluation for acute dizziness. EXAM: MRI HEAD WITHOUT CONTRAST TECHNIQUE: Multiplanar, multiecho pulse sequences of the brain and surrounding structures were obtained without intravenous contrast. COMPARISON:  MRI from 09/15/2020. FINDINGS: Brain: Generalized age-related cerebral atrophy with moderate chronic microvascular ischemic disease, stable. No abnormal foci of restricted diffusion to suggest acute or subacute ischemia. Gray-white matter differentiation maintained. No encephalomalacia to suggest chronic cortical infarction. No evidence for acute or chronic intracranial hemorrhage. No mass lesion, midline shift or mass effect.  Mild ventricular prominence related to global parenchymal volume loss without hydrocephalus. No extra-axial fluid collection. Pituitary gland suprasellar region within normal limits. Midline structures intact. Vascular: Major intracranial vascular flow voids are maintained. Skull and upper cervical spine: Craniocervical junction within normal limits. Bone marrow signal intensity normal. No focal marrow replacing lesion. No scalp soft tissue abnormality. Plagiocephaly noted. Sinuses/Orbits: Globes and orbital soft tissues within normal limits. Paranasal sinuses are largely clear. No mastoid effusion. Inner ear structures grossly normal. Other: None. IMPRESSION: 1. No acute intracranial abnormality. 2. Generalized age-related cerebral atrophy with moderate chronic microvascular ischemic disease, stable. Electronically Signed   By: Jeannine Boga M.D.   On: 10/26/2020 00:04   CT Abdomen Pelvis W Contrast  Result Date: 10/25/2020 CLINICAL DATA:  Acute generalized abdominal pain. History of prostate  cancer. EXAM: CT ABDOMEN AND PELVIS WITH CONTRAST TECHNIQUE: Multidetector CT imaging of the abdomen and pelvis was performed using the standard protocol following bolus administration of intravenous contrast. CONTRAST:  66m OMNIPAQUE IOHEXOL 350 MG/ML SOLN COMPARISON:  February 10, 2020. FINDINGS: Lower chest: No acute abnormality. Hepatobiliary: No focal liver abnormality is seen. No gallstones, gallbladder wall thickening, or biliary dilatation. Pancreas: Unremarkable. No pancreatic ductal dilatation or surrounding inflammatory changes. Spleen: Normal in size without focal abnormality. Adrenals/Urinary Tract: Adrenal glands appear normal. Stable left renal cysts are noted. Stable right renal cortical atrophy is noted with moderate hydroureteronephrosis which extends to surgical site in midportion of right ureter. Urinary bladder is decompressed secondary to Foley catheter. No renal or ureteral calculi are noted.  Stomach/Bowel: Stomach is within normal limits. Appendix appears normal. No evidence of bowel wall thickening, distention, or inflammatory changes. Vascular/Lymphatic: Aortic atherosclerosis. No enlarged abdominal or pelvic lymph nodes. Reproductive: Stable moderate prostatic enlargement is noted. Other: Small bilateral fat containing inguinal hernias are noted. No ascites is noted. Musculoskeletal: No acute or significant osseous findings. IMPRESSION: Stable moderate prostatic enlargement. Stable right renal cortical atrophy is noted with associated moderate hydroureteronephrosis which extends to surgical site midportion of right ureter. Urinary bladder is decompressed secondary to Foley catheter. Small bilateral fat containing inguinal hernias. Aortic Atherosclerosis (ICD10-I70.0). Electronically Signed   By: JMarijo ConceptionM.D.   On: 10/25/2020 15:40   [place]SCROTUM W/DOPPLER  Result Date: 10/25/2020 CLINICAL DATA:  Left greater than right scrotal pain EXAM: SCROTAL ULTRASOUND DOPPLER ULTRASOUND OF THE TESTICLES TECHNIQUE: Complete ultrasound examination of the testicles, epididymis, and other scrotal structures was performed. Color and spectral Doppler ultrasound were also utilized to evaluate blood flow to the testicles. COMPARISON:  Ultrasound 09/15/2020 FINDINGS: Right testicle Measurements: 3.6 x 2.5 x 2.7 cm. No mass or microlithiasis visualized. Left testicle Measurements: 4.2 x 2.3 x 3 cm. No mass or microlithiasis visualized. Right epididymis:  Normal in size and appearance. Left epididymis:  Normal in size and appearance. Hydrocele: Small bilateral hydroceles. Particulate debris within the hydroceles. Varicocele:  None visualized. Pulsed Doppler interrogation of both testes demonstrates normal low resistance arterial and venous waveforms bilaterally. IMPRESSION: 1. Small bilateral hydroceles. 2. Negative for testicular torsion Electronically Signed   By: KDonavan FoilM.D.   On: 10/25/2020 17:55         Scheduled Meds:  aspirin EC  81 mg Oral Q breakfast   atorvastatin  40 mg Oral Daily   Chlorhexidine Gluconate Cloth  6 each Topical Daily   clotrimazole-betamethasone  1 application Topical BID   enoxaparin (LOVENOX) injection  40 mg Subcutaneous Q24H   irbesartan  300 mg Oral Daily   And   hydrochlorothiazide  25 mg Oral Daily   insulin aspart  0-5 Units Subcutaneous QHS   insulin aspart  0-9 Units Subcutaneous TID WC   metoprolol tartrate  50 mg Oral BID   mirabegron ER  25 mg Oral Daily   Continuous Infusions:   LOS: 1 day    Time spent: 25 minutes    KBarb Merino MD [facility] Pager [phone]

## 2020-10-26 NOTE — Plan of Care (Signed)
  Problem: Education: Goal: Knowledge of General Education information will improve Description Including pain rating scale, medication(s)/side effects and non-pharmacologic comfort measures Outcome: Progressing   

## 2020-10-26 NOTE — Progress Notes (Signed)
Patient attempted to get out of bed and fell on his buttocks to the floor. VSS. MD made aware. Will continue to monitor. No injury visible at this time.

## 2020-10-26 NOTE — Progress Notes (Signed)
Patient arrived to unit alert oriented x3 refused care from staff oriented to unit. On call notified new order received. Will continue to monitor

## 2020-10-26 NOTE — Chronic Care Management (AMB) (Signed)
  Chronic Care Management   Inpatient Admit Review Note  10/26/2020 Name: [name] MRN: [id number] DOB: 03/27/1955  TERESA CIANCIOLA is a 65 y.o. year old male who is a primary care patient of [name], [place]. DONIVAN BURAU is actively engaged with the embedded care management team in the primary care practice and is being followed by All embedded care coordination for assistance with disease management and care coordination needs related to HTN, DM, and CKD Stage III .   MANAS MCGUGAN is currently admitted to the hospital for evaluation and treatment of  Acute Urinary Retention and Hypertensive Urgency . Current treatment plan is outlined below.  #1 hypertensive urgency: Patient will be admitted.  Initiate home regimen.  He was on beta-blocker but stopped.  Will resume beta-blockers as well as his as needed labetalol.  Blood pressure already slowly improving.   #2 urinary retention: Foley catheter in place.  No evidence of UTI.  Patient has removed Foley earlier.  Counseling provided.  Slight agitation.  We will give some Ativan.   #3 adjustment disorder with anxiety: As needed Ativan.  Counseling.   #4 dizziness with lightheadedness: MRI of the brain ordered.  Will follow results.   #5 diabetes: Non-insulin-dependent.  Sliding scale insulin.   #6 acute on chronic kidney disease stage IIIa: Hydrate and follow renal function   #7 hyperlipidemia: Continue statin   #8 history of prostate cancer: Defer treatment to oncology   #9 significant low back pain: No evidence of mets.  Symptomatic treatment.  Plan: CM team will collaborate with [facility] and will follow patient post discharge. SW advised hospital liaison of barriers with maintaining contact with the patient due to an invalid number. SW requested an inpatient SW consult to assess patient ability to return to the home safely as well as to obtain a working contact number for care management team to follow post discharge.    [name], BSW, CDP Social Worker, Certified Dementia Practitioner [place] [facility] [place] [facility] [phone]

## 2020-10-26 NOTE — Consult Note (Signed)
[facility] CM Inpatient Consult   10/26/2020  [name] 1956/01/21 [id number]  [place] Organization [ACO] Patient: [name]   Patient was screened for [place] Management services due to extreme risk score for unplanned readmission. This patient has primary care at [facility] with an embedded chronic disease management team.  Notified this AM that patient is currently enrolled with disciplines at [facility] for chronic disease management.   Plan: Per outpatient social worker, there have been unsuccessful attempts to contact patient by telephone for follow up recently. Will continue to monitor for progression and attempt to speak with patient to assess community needs and verify home contact information.   Of note, [facility] Care Management services does not replace or interfere with any services that are arranged by inpatient case management or social work.   Netta Cedars, MSN, [facility] Liaison Nurse Mobile Phone [phone]  Toll free office [phone]

## 2020-10-26 NOTE — Progress Notes (Addendum)
Patient attempted to void after having foley catheter removed and was unable to void. Bladder scan performed and showed that patient had 89 mL in his bladder. Will continue to monitor.

## 2020-10-27 LAB — GLUCOSE, CAPILLARY
Glucose-Capillary: 173 mg/dL — ABNORMAL HIGH (ref 70–99)
Glucose-Capillary: 147 mg/dL — ABNORMAL HIGH (ref 70–99)
Glucose-Capillary: 167 mg/dL — ABNORMAL HIGH (ref 70–99)
Glucose-Capillary: 100 mg/dL — ABNORMAL HIGH (ref 70–99)

## 2020-10-27 LAB — BASIC METABOLIC PANEL
Anion gap: 10 (ref 5–15)
BUN: 46 mg/dL — ABNORMAL HIGH (ref 8–23)
CO2: 25 mmol/L (ref 22–32)
Calcium: 9.5 mg/dL (ref 8.9–10.3)
Chloride: 100 mmol/L (ref 98–111)
Creatinine, Ser: 1.9 mg/dL — ABNORMAL HIGH (ref 0.61–1.24)
GFR, Estimated: 38 mL/min — ABNORMAL LOW (ref >60)
Glucose, Bld: 147 mg/dL — ABNORMAL HIGH (ref 70–99)
Potassium: 4.6 mmol/L (ref 3.5–5.1)
Sodium: 135 mmol/L (ref 135–145)

## 2020-10-27 LAB — PHOSPHORUS: Phosphorus: 4.4 mg/dL (ref 2.5–4.6)

## 2020-10-27 LAB — MAGNESIUM: Magnesium: 2.3 mg/dL (ref 1.7–2.4)

## 2020-10-27 MED ORDER — FOLIC ACID 1 MG PO TABS
1.0000 mg | ORAL_TABLET | Freq: Every day | ORAL | Status: DC
  Administered 2020-10-27 – 2020-10-29 (×3): 1 mg via ORAL
  Filled 2020-10-27 (×3): qty 1

## 2020-10-27 MED ORDER — HALOPERIDOL 5 MG PO TABS
5.0000 mg | ORAL_TABLET | Freq: Three times a day (TID) | ORAL | Status: DC | PRN
  Administered 2020-10-27 – 2020-10-28 (×3): 5 mg via ORAL
  Filled 2020-10-27 (×3): qty 1

## 2020-10-27 MED ORDER — HALOPERIDOL LACTATE 5 MG/ML IJ SOLN
2.0000 mg | Freq: Once | INTRAMUSCULAR | Status: AC
  Administered 2020-10-27: 2 mg via INTRAVENOUS
  Filled 2020-10-27: qty 1

## 2020-10-27 MED ORDER — THIAMINE HCL 100 MG PO TABS
100.0000 mg | ORAL_TABLET | Freq: Every day | ORAL | Status: DC
  Administered 2020-10-27 – 2020-10-29 (×3): 100 mg via ORAL
  Filled 2020-10-27 (×3): qty 1

## 2020-10-27 MED ORDER — THIAMINE HCL 100 MG/ML IJ SOLN: 100.0000 mg | Freq: Every day | INTRAMUSCULAR | Status: DC

## 2020-10-27 MED ORDER — LORAZEPAM 1 MG PO TABS: 1.0000 mg | ORAL_TABLET | ORAL | Status: DC | PRN

## 2020-10-27 MED ORDER — ADULT MULTIVITAMIN W/MINERALS CH
1.0000 | ORAL_TABLET | Freq: Every day | ORAL | Status: DC
  Administered 2020-10-27 – 2020-10-29 (×3): 1 via ORAL
  Filled 2020-10-27 (×3): qty 1

## 2020-10-27 MED ORDER — LORAZEPAM 2 MG/ML IJ SOLN
1.0000 mg | INTRAMUSCULAR | Status: DC | PRN
  Administered 2020-10-27: 2 mg via INTRAVENOUS
  Administered 2020-10-27: 4 mg via INTRAVENOUS
  Administered 2020-10-27 – 2020-10-28 (×2): 2 mg via INTRAVENOUS
  Filled 2020-10-27: qty 2
  Filled 2020-10-27 (×4): qty 1

## 2020-10-27 NOTE — TOC Initial Note (Addendum)
Transition of Care [facility]) - Initial/Assessment Note    Patient Details  Name: [name] MRN: [id number] Date of Birth: 11-Oct-1955  Transition of Care [facility]) CM/SW Contact:    Ross Ludwig, LCSW Phone Number: 10/27/2020, 4:34 PM  Clinical Narrative:                 Patient is a 65 year old male who lives alone, he is alert and oriented x3.  Patient has history of alcohol use and is under CIWA protocol.  Patient does have some mild confusion.  Patient was originally planning to go home with home health, but now he wants to try to go to SNF.  CSW explained to him that because of how he did with PT, he may not get approved by insurance company.  Patient stated that he understands, and would like CSW to try to find placement for rehab for him.  Patient was explained the process and how insurance will pay for stay.  Per patient he expressed understanding, and would still like CSW to begin bed search.  CSW asked him if he has been vaccinated for Covid and he said he has received two Moderna vaccines, but has not received a booster.  CSW asked if he is interested in receiving one booster, because this will give him a better chance to get in a SNF, and he said no he is not interested and does not feel like he needs it.  CSW expressed understanding, and will begin bed search for SNF placement.  Expected Discharge Plan: Skilled Nursing Facility Barriers to Discharge: Insurance Authorization, Continued Medical Work up   Patient Goals and CMS Choice Patient states their goals for this hospitalization and ongoing recovery are:: To go to SNF for short term rehab then return back home. CMS Medicare.gov Compare Post Acute Care list provided to:: Patient Choice offered to / list presented to : Patient  Expected Discharge Plan and Services Expected Discharge Plan: [place] In-house Referral: Clinical Social Work   Post Acute Care Choice: [place] Living arrangements for  the past 2 months: Apartment Expected Discharge Date: 10/27/20                                    Prior Living Arrangements/Services Living arrangements for the past 2 months: Apartment Lives with:: Self Patient language and need for interpreter reviewed:: No Do you feel safe going back to the place where you live?: No   Patient feels he needs SNF placement before he returns back home.  Need for Family Participation in Patient Care: No (Comment) Care giver support system in place?: Yes (comment) Current home services: Homehealth aide Criminal Activity/Legal Involvement Pertinent to Current Situation/Hospitalization: No - Comment as needed  Activities of Daily Living Home Assistive Devices/Equipment: None ADL Screening (condition at time of admission) Patient's cognitive ability adequate to safely complete daily activities?: Yes Is the patient deaf or have difficulty hearing?: No Does the patient have difficulty seeing, even when wearing glasses/contacts?: No Does the patient have difficulty concentrating, remembering, or making decisions?: No Patient able to express need for assistance with ADLs?: Yes Does the patient have difficulty dressing or bathing?: Yes Independently performs ADLs?: No Communication: Independent Does the patient have difficulty walking or climbing stairs?: Yes Weakness of Legs: Both Weakness of Arms/Hands: Both  Permission Sought/Granted Permission sought to share information with : Case Manager, Customer service manager,  Family Supports Permission granted to share information with : Yes, Verbal Permission Granted  Share Information with NAME: Clonch,Janet Sister [phone]  [phone]  Permission granted to share info w AGENCY: SNF admissions        Emotional Assessment Appearance:: Appears stated age     Orientation: : Oriented to Self, Oriented to  Time, Oriented to Place Alcohol / Substance Use: Alcohol Use Psych Involvement:  No (comment)  Admission diagnosis:  Hypertensive urgency [I16.0] Acute urinary retention [R33.8] Hypertensive urgency, malignant [I16.0] Chronic low back pain without sciatica, unspecified back pain laterality [M54.50, G89.29] Patient Active Problem List   Diagnosis Date Noted   Hypertensive urgency, malignant 10/25/2020   Acute urinary retention 10/25/2020   Adjustment disorder with mixed anxiety and depressed mood    Acute renal failure superimposed on stage 3a chronic kidney disease ([place]) 09/15/2020   Ataxia 08/01/2020   Dizziness 07/31/2020   Chest pain 07/31/2020   Hip pain 07/31/2020   AKI (acute kidney injury) ([place]) 02/11/2020   Hydroureteronephrosis 02/10/2020   Nausea and vomiting 02/10/2020   History of COVID-19 02/10/2020   Pneumonia due to COVID-19 virus 09/29/2019   Hyponatremia 09/28/2019   Sinus tachycardia 09/28/2019   Insomnia 08/26/2018   Nephropathy 03/26/2018   Urinary urgency 03/26/2018   Type 2 diabetes mellitus with diabetic nephropathy, without long-term current use of insulin ([place]) 03/26/2018   Essential hypertension 03/26/2018   Chronic kidney disease (CKD), stage III (moderate) ([place]) 03/26/2018   Conductive hearing loss, bilateral 11/07/2017   Uncontrolled type 2 diabetes mellitus with hyperglycemia ([place]) 07/31/2016   Dyslipidemia associated with type 2 diabetes mellitus ([place]) 05/29/2016   Microcytic anemia 02/17/2012   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

## 2020-10-27 NOTE — Plan of Care (Signed)
  Problem: Education: Goal: Knowledge of General Education information will improve Description Including pain rating scale, medication(s)/side effects and non-pharmacologic comfort measures Outcome: Progressing   

## 2020-10-27 NOTE — NC FL2 (Signed)
[place] LEVEL OF CARE SCREENING TOOL     IDENTIFICATION  Patient Name: [name] Birthdate: 1956-02-27 Sex: male Admission Date (Current Location): 10/25/2020  [place] and [place] Number:  [name] [id number] [place] and Address:  [facility],  [address], [place]      Provider Number: [id number]  Attending Physician Name and Address:  Barb Merino, MD  Relative Name and Phone Number:  [name] Sister [phone]  [phone]    Current Level of Care: Hospital Recommended Level of Care: [place] Prior Approval Number:    Date Approved/Denied:   PASRR Number: [id number]  Discharge Plan: SNF    Current Diagnoses: Patient Active Problem List   Diagnosis Date Noted   Hypertensive urgency, malignant 10/25/2020   Acute urinary retention 10/25/2020   Adjustment disorder with mixed anxiety and depressed mood    Acute renal failure superimposed on stage 3a chronic kidney disease ([place]) 09/15/2020   Ataxia 08/01/2020   Dizziness 07/31/2020   Chest pain 07/31/2020   Hip pain 07/31/2020   AKI (acute kidney injury) ([place]) 02/11/2020   Hydroureteronephrosis 02/10/2020   Nausea and vomiting 02/10/2020   History of COVID-19 02/10/2020   Pneumonia due to COVID-19 virus 09/29/2019   Hyponatremia 09/28/2019   Sinus tachycardia 09/28/2019   Insomnia 08/26/2018   Nephropathy 03/26/2018   Urinary urgency 03/26/2018   Type 2 diabetes mellitus with diabetic nephropathy, without long-term current use of insulin ([place]) 03/26/2018   Essential hypertension 03/26/2018   Chronic kidney disease (CKD), stage III (moderate) ([place]) 03/26/2018   Conductive hearing loss, bilateral 11/07/2017   Uncontrolled type 2 diabetes mellitus with hyperglycemia ([place]) 07/31/2016   Dyslipidemia associated with type 2 diabetes mellitus ([place]) 05/29/2016   Microcytic anemia 02/17/2012   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   Benign hypertension  02/17/2012   Depressive disorder 02/17/2012    Orientation RESPIRATION BLADDER Height & Weight     Self, Time, Situation, Place  Normal Continent Weight: 169 lb 12.1 oz (77 kg) Height:  '5\' 6"'$  (167.6 cm)  BEHAVIORAL SYMPTOMS/MOOD NEUROLOGICAL BOWEL NUTRITION STATUS      Continent Diet  AMBULATORY STATUS COMMUNICATION OF NEEDS Skin   Limited Assist Verbally Normal                       Personal Care Assistance Level of Assistance  Bathing, Dressing, Feeding Bathing Assistance: Limited assistance Feeding assistance: Independent Dressing Assistance: Limited assistance     Functional Limitations Info  Sight, Speech, Hearing Sight Info: Adequate Hearing Info: Adequate Speech Info: Adequate    SPECIAL CARE FACTORS FREQUENCY  PT (By licensed PT), OT (By licensed OT)     PT Frequency: Min 5x a week OT Frequency: Min 5x a week            Contractures Contractures Info: Not present    Additional Factors Info  Code Status, Allergies, Insulin Sliding Scale Code Status Info: Full Code Allergies Info: NKA   Insulin Sliding Scale Info: insulin aspart (novoLOG) injection 0-9 Units       Current Medications (10/27/2020):  This is the current hospital active medication list Current Facility-Administered Medications  Medication Dose Route Frequency Provider Last Rate Last Admin   acetaminophen (TYLENOL) tablet 650 mg  650 mg Oral Q6H PRN Elwyn Reach, MD   650 mg at 10/26/20 1300   aspirin EC tablet 81 mg  81 mg Oral Q breakfast Elwyn Reach, MD   81 mg  at 10/27/20 0806   atorvastatin (LIPITOR) tablet 40 mg  40 mg Oral Daily Elwyn Reach, MD   40 mg at 10/27/20 0957   Chlorhexidine Gluconate Cloth 2 % PADS 6 each  6 each Topical Daily Barb Merino, MD   6 each at 10/27/20 [id number]   clotrimazole-betamethasone (LOTRISONE) cream 1 application  1 application Topical BID Elwyn Reach, MD   1 application at [id number]   diclofenac Sodium (VOLTAREN) 1 %  topical gel 4 g  4 g Topical Daily PRN Elwyn Reach, MD       enoxaparin (LOVENOX) injection 40 mg  40 mg Subcutaneous Q24H Gala Romney L, MD   40 mg at 10/26/20 1946   haloperidol (HALDOL) tablet 5 mg  5 mg Oral Q8H PRN Barb Merino, MD   5 mg at 10/27/20 1457   irbesartan (AVAPRO) tablet 300 mg  300 mg Oral Daily Dimple Nanas, RPH   300 mg at 10/27/20 0957   And   hydrochlorothiazide (HYDRODIURIL) tablet 25 mg  25 mg Oral Daily Dimple Nanas, RPH   25 mg at 10/27/20 1007   insulin aspart (novoLOG) injection 0-5 Units  0-5 Units Subcutaneous QHS Barb Merino, MD   2 Units at 10/26/20 2203   insulin aspart (novoLOG) injection 0-9 Units  0-9 Units Subcutaneous TID WC Barb Merino, MD   1 Units at 10/27/20 1238   LORazepam (ATIVAN) injection 1 mg  1 mg Intravenous Q4H PRN Gala Romney L, MD   1 mg at 10/27/20 1359   metoprolol tartrate (LOPRESSOR) tablet 50 mg  50 mg Oral BID Barb Merino, MD   50 mg at 10/27/20 0957   mirabegron ER (MYRBETRIQ) tablet 25 mg  25 mg Oral Daily Gala Romney L, MD   25 mg at 10/27/20 0959   morphine 2 MG/ML injection 2 mg  2 mg Intravenous Q2H PRN Elwyn Reach, MD   2 mg at 10/27/20 1310   nystatin (MYCOSTATIN/NYSTOP) topical powder 1 application  1 application Topical BID PRN Elwyn Reach, MD       ondansetron (ZOFRAN) tablet 4 mg  4 mg Oral Q6H PRN Elwyn Reach, MD       Or   ondansetron (ZOFRAN) injection 4 mg  4 mg Intravenous Q6H PRN Garba, Mohammad L, MD       tetrahydrozoline 0.05 % ophthalmic solution 1 drop  1 drop Both Eyes Daily PRN Elwyn Reach, MD       traMADol (ULTRAM) tablet 50 mg  50 mg Oral Q6H PRN Elwyn Reach, MD   50 mg at 10/26/20 0758   traZODone (DESYREL) tablet 50 mg  50 mg Oral QHS PRN Elwyn Reach, MD   50 mg at 10/26/20 1946     Discharge Medications: Please see discharge summary for a list of discharge medications.  Relevant Imaging Results:  Relevant Lab  Results:   Additional Information SSN [id number]  [name], [place]
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
## 2020-10-27 NOTE — Consult Note (Signed)
[facility] CM Inpatient Consult   10/27/2020  CURLEE ROZZI 06-02-1955 [id number]  [facility] CM Follow up note:   Spoke with patient at bedside. HIPAA verified. Communicated to patient that I received message from PCP office, embedded care management team, that they have been unable to reach him with phone number listed on chart. Confirmed with patient correct contact number is [phone]. While in room, patient requested food, toothbrush, and toothpaste. Provided him with toothpaste and toothbrush. Patient's primary RN provided a basin. Notified patient's primary RN of request for food.  Noted that PT current recommendation is for SNF.  Plan: Continue to monitor for progression.  Of note, [facility] Care Management services does not replace or interfere with any services that are arranged by inpatient case management or social work.   Netta Cedars, MSN, [facility] Liaison Nurse Mobile Phone [phone]  Toll free office [phone]

## 2020-10-27 NOTE — Progress Notes (Signed)
PROGRESS NOTE    [name]  [id number] DOB: 08-Nov-1955 DOA: 10/25/2020 PCP: [name], [name]    Brief Narrative:  65 year old gentleman with history of prostate cancer with recurrence, intermittent urinary retention, CKD stage III AAA, type 2 diabetes on oral hypoglycemics, hyperlipidemia, macular degeneration and hypertension presented to the emergency room with low back pain, dizziness and lightheadedness.  Also with frequent falls at home.  In the emergency room blood pressure 202/109.  Heart rate 125.  95% room air.  Influenza and COVID-19 negative.  Urinalysis normal.  CT scan abdomen pelvis showed moderate prostate enlargement.  MRI of the brain was done that was essentially normal for any acute findings.  Admitted to hospital with elevated blood pressures and urinary obstruction.   Assessment & Plan:   Principal Problem:   Hypertensive urgency, malignant Active Problems:   Dyslipidemia associated with type 2 diabetes mellitus (HCC)   Urinary urgency   Dizziness   Acute renal failure superimposed on stage 3a chronic kidney disease (HCC)   Adjustment disorder with mixed anxiety and depressed mood   Acute urinary retention  Hypertensive urgency: Presentation blood pressure more than 200.  Treated with as needed medications and resumed his home doses of Benicar and beta-blocker.  Blood pressure already appropriately decreasing.  Will monitor.  Will not need more blood pressure medications. With concern about dizziness, MRI of the brain was done that is incessantly normal. He was not taking metoprolol at home.  Will resume.  Recurrent urinary obstruction: Due to prostatomegaly from prostate cancer.  He is doing occasional straight cath at home.  Does not have enough support system.  Failed voiding trial.   Foley catheter inserted.  Discharged with Foley catheter. He does have urologist at [facility] and has an appointment next week.  Discharging with Foley catheter.  He is  aware about catheter care.  AKI on CKD3a: Due to #1.  Adequately improved to his baseline.  Creatinine 1.9.  At about his baseline.  Type 2 diabetes with CKD3a: On semaglutide and Tradjenta at home.  Well controlled.  Currently remains on sliding scale insulin.  Continue.  Resume on discharge.  Anxiety/ depression: Uses trazodone at night.  Continued.  Deconditioning, falls: Patient had multiple episodes of fall due to ongoing deconditioning.  Work with PT OT.  They recommended SNF rehab.  Referral made.  Impulsiveness/intermittent agitation: Patient has intermittent agitation.  Poor historian.  Denies everyday use of alcohol.  Becomes impulsive and walks without support with risk of falling.  We will start patient on CIWA protocol with benzodiazepine.   DVT prophylaxis: enoxaparin (LOVENOX) injection 40 mg Start: 10/25/20 2000   Code Status: Full code Family Communication: Sister on the phone 8/11. Disposition Plan: Status is: Inpatient  Remains inpatient appropriate because:Inpatient level of care appropriate due to severity of illness  Dispo: The patient is from: Home              Anticipated d/c is to: Skilled nursing facility.  If unable will need home health PT OT.              Patient currently i is not medically stable.   Difficult to place patient No    Consultants:  None  Procedures:  None  Antimicrobials:  None   Subjective: Patient seen and examined.  Early morning he asked me to go for Foley catheter and instead of frequent catheterization so that he can follow-up with urology.  He is poor historian.  Slightly  impulsive and does not quite follow instructions.  Intermittently gets agitated and confused. Worked with PT who recommended SNF.  Discharge was canceled.  He is agreeable. Objective: Vitals:   10/27/20 0526 10/27/20 0833 10/27/20 0956 10/27/20 1348  BP: 115/67 130/86 134/68 131/80  Pulse: (!) 56 61 70 (!) 59  Resp: '16 18  18  '$ Temp: 98.1 F (36.7 C)  98 F (36.7 C)  98 F (36.7 C)  TempSrc: Oral Oral  Oral  SpO2: 98% 98%  97%  Weight:      Height:        Intake/Output Summary (Last 24 hours) at 10/27/2020 1610 Last data filed at 10/27/2020 0915 Gross per 24 hour  Intake 340 ml  Output 1048 ml  Net -708 ml   Filed Weights   10/25/20 1038  Weight: 77 kg    Examination:  General exam: Appears calm and comfortable at rest.  Occasionally anxious and impulsive. Not in any distress.  On room air. Respiratory system: Clear to auscultation. Respiratory effort normal. Cardiovascular system: S1 & S2 heard, RRR. No JVD, murmurs, rubs, gallops or clicks. No pedal edema. Gastrointestinal system: Abdomen is nondistended, soft and nontender. No organomegaly or masses felt. Normal bowel sounds heard. Central nervous system: Alert and oriented. No focal neurological deficits. Extremities: Symmetric 5 x 5 power. Skin: No rashes, lesions or ulcers Psychiatry: Judgement and insight appear normal.  Flat affect.    Data Reviewed: I have personally reviewed following labs and imaging studies  CBC: Recent Labs  Lab 10/25/20 1350 10/25/20 1424 10/25/20 2010 10/26/20 0506  WBC 10.1  --  11.2* 8.8  NEUTROABS 8.3*  --   --   --   HGB 18.9* 19.0* 18.5* 18.4*  HCT 57.1* 56.0* 55.9* 58.2*  MCV 87.2  --  86.3 90.7  PLT 215  --  199 [id number]   Basic Metabolic Panel: Recent Labs  Lab 10/25/20 1350 10/25/20 1424 10/25/20 2010 10/26/20 0506 10/27/20 0537  NA 139 141  --  136 135  K 4.6 4.5  --  4.5 4.6  CL 104 103  --  103 100  CO2 22  --   --  22 25  GLUCOSE 156* 159*  --  217* 147*  BUN 30* 31*  --  29* 46*  CREATININE 1.72* 1.60* 1.67* 1.61* 1.90*  CALCIUM 9.7  --   --  9.3 9.5  MG  --   --   --   --  2.3  PHOS  --   --   --   --  4.4   GFR: Estimated Creatinine Clearance: 37.4 mL/min (A) (by C-G formula based on SCr of 1.9 mg/dL (H)). Liver Function Tests: Recent Labs  Lab 10/25/20 1350 10/26/20 0506  AST 22 23  ALT 33 26   ALKPHOS 101 90  BILITOT 0.4 1.1  PROT 7.6 7.1  ALBUMIN 4.1 3.8   Recent Labs  Lab 10/25/20 1350  LIPASE 32   No results for input(s): AMMONIA in the last 168 hours. Coagulation Profile: No results for input(s): INR, PROTIME in the last 168 hours. Cardiac Enzymes: No results for input(s): CKTOTAL, CKMB, CKMBINDEX, TROPONINI in the last 168 hours. BNP (last 3 results) No results for input(s): PROBNP in the last 8760 hours. HbA1C: No results for input(s): HGBA1C in the last 72 hours. CBG: Recent Labs  Lab 10/26/20 1141 10/26/20 1629 10/26/20 2107 10/27/20 0745 10/27/20 1212  GLUCAP 225* 158* 239* 173* 147*   Lipid Profile: No  results for input(s): CHOL, HDL, LDLCALC, TRIG, CHOLHDL, LDLDIRECT in the last 72 hours. Thyroid Function Tests: No results for input(s): TSH, T4TOTAL, FREET4, T3FREE, THYROIDAB in the last 72 hours. Anemia Panel: No results for input(s): VITAMINB12, FOLATE, FERRITIN, TIBC, IRON, RETICCTPCT in the last 72 hours. Sepsis Labs: No results for input(s): PROCALCITON, LATICACIDVEN in the last 168 hours.  Recent Results (from the past 240 hour(s))  Resp Panel by RT-PCR (Flu A&B, Covid) Nasopharyngeal Swab     Status: None   Collection Time: 10/25/20  4:51 PM   Specimen: Nasopharyngeal Swab; Nasopharyngeal(NP) swabs in vial transport medium  Result Value Ref Range Status   SARS Coronavirus 2 by RT PCR NEGATIVE NEGATIVE Final    Comment: (NOTE) SARS-CoV-2 target nucleic acids are NOT DETECTED.  The SARS-CoV-2 RNA is generally detectable in upper respiratory specimens during the acute phase of infection. The lowest concentration of SARS-CoV-2 viral copies this assay can detect is 138 copies/mL. A negative result does not preclude SARS-Cov-2 infection and should not be used as the sole basis for treatment or other patient management decisions. A negative result may occur with  improper specimen collection/handling, submission of specimen other than  nasopharyngeal swab, presence of viral mutation(s) within the areas targeted by this assay, and inadequate number of viral copies(<138 copies/mL). A negative result must be combined with clinical observations, patient history, and epidemiological information. The expected result is Negative.  Fact Sheet for Patients:  EntrepreneurPulse.com.au  Fact Sheet for Healthcare Providers:  IncredibleEmployment.be  This test is no t yet approved or cleared by the [place] FDA and  has been authorized for detection and/or diagnosis of SARS-CoV-2 by FDA under an Emergency Use Authorization (EUA). This EUA will remain  in effect (meaning this test can be used) for the duration of the COVID-19 declaration under Section 564(b)(1) of the Act, 21 U.S.C.section 360bbb-3(b)(1), unless the authorization is terminated  or revoked sooner.       Influenza A by PCR NEGATIVE NEGATIVE Final   Influenza B by PCR NEGATIVE NEGATIVE Final    Comment: (NOTE) The Xpert Xpress SARS-CoV-2/FLU/RSV plus assay is intended as an aid in the diagnosis of influenza from Nasopharyngeal swab specimens and should not be used as a sole basis for treatment. Nasal washings and aspirates are unacceptable for Xpert Xpress SARS-CoV-2/FLU/RSV testing.  Fact Sheet for Patients: EntrepreneurPulse.com.au  Fact Sheet for Healthcare Providers: IncredibleEmployment.be  This test is not yet approved or cleared by the [place] FDA and has been authorized for detection and/or diagnosis of SARS-CoV-2 by FDA under an Emergency Use Authorization (EUA). This EUA will remain in effect (meaning this test can be used) for the duration of the COVID-19 declaration under Section 564(b)(1) of the Act, 21 U.S.C. section 360bbb-3(b)(1), unless the authorization is terminated or revoked.  Performed at [facility], [place] [address] [place], [place] [address]          Radiology Studies: MR BRAIN WO CONTRAST  Result Date: 10/26/2020 CLINICAL DATA:  Initial evaluation for acute dizziness. EXAM: MRI HEAD WITHOUT CONTRAST TECHNIQUE: Multiplanar, multiecho pulse sequences of the brain and surrounding structures were obtained without intravenous contrast. COMPARISON:  MRI from 09/15/2020. FINDINGS: Brain: Generalized age-related cerebral atrophy with moderate chronic microvascular ischemic disease, stable. No abnormal foci of restricted diffusion to suggest acute or subacute ischemia. Gray-white matter differentiation maintained. No encephalomalacia to suggest chronic cortical infarction. No evidence for acute or chronic intracranial hemorrhage. No mass lesion, midline shift or mass effect.  Mild ventricular prominence related to global parenchymal volume loss without hydrocephalus. No extra-axial fluid collection. Pituitary gland suprasellar region within normal limits. Midline structures intact. Vascular: Major intracranial vascular flow voids are maintained. Skull and upper cervical spine: Craniocervical junction within normal limits. Bone marrow signal intensity normal. No focal marrow replacing lesion. No scalp soft tissue abnormality. Plagiocephaly noted. Sinuses/Orbits: Globes and orbital soft tissues within normal limits. Paranasal sinuses are largely clear. No mastoid effusion. Inner ear structures grossly normal. Other: None. IMPRESSION: 1. No acute intracranial abnormality. 2. Generalized age-related cerebral atrophy with moderate chronic microvascular ischemic disease, stable. Electronically Signed   By: Jeannine Boga M.D.   On: 10/26/2020 00:04   US SCROTUM W/DOPPLER  Result Date: 10/25/2020 CLINICAL DATA:  Left greater than right scrotal pain EXAM: SCROTAL ULTRASOUND DOPPLER ULTRASOUND OF THE TESTICLES TECHNIQUE: Complete ultrasound examination of the testicles, epididymis, and other scrotal structures was  performed. Color and spectral Doppler ultrasound were also utilized to evaluate blood flow to the testicles. COMPARISON:  Ultrasound 09/15/2020 FINDINGS: Right testicle Measurements: 3.6 x 2.5 x 2.7 cm. No mass or microlithiasis visualized. Left testicle Measurements: 4.2 x 2.3 x 3 cm. No mass or microlithiasis visualized. Right epididymis:  Normal in size and appearance. Left epididymis:  Normal in size and appearance. Hydrocele: Small bilateral hydroceles. Particulate debris within the hydroceles. Varicocele:  None visualized. Pulsed Doppler interrogation of both testes demonstrates normal low resistance arterial and venous waveforms bilaterally. IMPRESSION: 1. Small bilateral hydroceles. 2. Negative for testicular torsion Electronically Signed   By: Donavan Foil M.D.   On: 10/25/2020 17:55        Scheduled Meds:  aspirin EC  81 mg Oral Q breakfast   atorvastatin  40 mg Oral Daily   Chlorhexidine Gluconate Cloth  6 each Topical Daily   clotrimazole-betamethasone  1 application Topical BID   enoxaparin (LOVENOX) injection  40 mg Subcutaneous [id number]   folic acid  1 mg Oral Daily   irbesartan  300 mg Oral Daily   And   hydrochlorothiazide  25 mg Oral Daily   insulin aspart  0-5 Units Subcutaneous QHS   insulin aspart  0-9 Units Subcutaneous TID WC   metoprolol tartrate  50 mg Oral BID   mirabegron ER  25 mg Oral Daily   multivitamin with minerals  1 tablet Oral Daily   thiamine  100 mg Oral Daily   Or   thiamine  100 mg Intravenous Daily   Continuous Infusions:   LOS: 2 days    Time spent: 25 minutes    Barb Merino, MD [facility] Pager [phone]

## 2020-10-27 NOTE — Progress Notes (Signed)
Patient continues to attempt getting out of bed and ambulating without assistance. Re-educated patient on the importance of ringing call bell and not getting up without staff due to recent fall. Patient argumentative regarding the bed alarm and verbalized frustration with the fall precautions in place at this time. Re-educated patient on the necessity of keeping bed alarm on and keeping fall mats in place.

## 2020-10-27 NOTE — Evaluation (Signed)
Physical Therapy Evaluation Patient Details Name: [name] MRN: [id number] DOB: 08-04-1955 Today's Date: 10/27/2020   History of Present Illness  Pt admitted from home with hypertensive urgency, urinary retention, dizziness and c/o back pain.  Pt with hx of DM, CKD, macular degeneration, prostate CA, L ankle fusion, chronic LBP and adjustment disorder with anxiety.  Clinical Impression  Pt admitted as above and presenting with functional mobility limitations 2* generalized weakness, significant instability with gait at high risk for falls, and questionable safety awareness.  This date, pt up to ambulate with assist in halls with marked instability - pt compensating with widened BOS and constantly reaching for rails on wall to steady - pt refusing to use AD stating he has tried a RW and a cane and they don't help.  Pt states he is willing to consider short-term rehab to address strength/balance issues and maximize IND and safety prior to return home with limited assistance.  However, pt was initially adamant on returning home today so may change mind again.  In which case HHPT recommended for follow up.    Follow Up Recommendations SNF    Equipment Recommendations  None recommended by PT    Recommendations for Other Services OT consult     Precautions / Restrictions Precautions Precautions: Fall Restrictions Weight Bearing Restrictions: No      Mobility  Bed Mobility               General bed mobility comments: Pt OOB with nursing and up in chair    Transfers Overall transfer level: Needs assistance Equipment used: Rolling walker (2 wheeled) Transfers: Sit to/from [facility] Sit to Stand: Supervision Stand pivot transfers: Min guard       General transfer comment: steady assist  Ambulation/Gait Ambulation/Gait assistance: Min assist Gait Distance (Feet): 150 Feet Assistive device: None Gait Pattern/deviations: Step-through pattern;Decreased  step length - right;Decreased step length - left;Shuffle;Antalgic;Trunk flexed;Wide base of support Gait velocity: decr   General Gait Details: Pt ambulated sans AD 2* refusing to use (I have tried those and they don't help).  Pt with instability in all directions, widened BOS to compensate, limited knee flex on swing through and near constant reaching for rails in hall to stabilize.  Stairs            Wheelchair Mobility    Modified Rankin (Stroke Patients Only)       Balance Overall balance assessment: Needs assistance Sitting-balance support: No upper extremity supported;Feet supported Sitting balance-Leahy Scale: Good     Standing balance support: No upper extremity supported Standing balance-Leahy Scale: Fair                               Pertinent Vitals/Pain Pain Assessment: 0-10 Pain Score: 2  Pain Location: back pain Pain Descriptors / Indicators: Aching    Home Living Family/patient expects to be discharged to:: Private residence Living Arrangements: Alone Available Help at Discharge: Friend(s);Available PRN/intermittently Type of Home: Apartment Home Access: Stairs to enter Entrance Stairs-Rails: Right;Left Entrance Stairs-Number of Steps: 1 Home Layout: Two level;Able to live on main level with bedroom/bathroom;1/2 bath on main level Home Equipment: Shower seat;Walker - 2 wheels;Bedside commode;Grab bars - tub/shower;Grab bars - toilet Additional Comments: Pt reports living in same place as last admit.  Pt stated he has an aide (who shows up when she wants to) to assist with housekeeping.    Prior Function Level of Independence: Needs  assistance   Gait / Transfers Assistance Needed: Walks without AD  ADL's / Homemaking Assistance Needed: Patient states he sponge bathes on 1st floor in 1/2 bath. has assist with community mobility via "lady friend", but he also takes the bus.  Comments: lives on main level of home due to difficulty ascending  flight of stairs     Hand Dominance   Dominant Hand: Right    Extremity/Trunk Assessment   Upper Extremity Assessment Upper Extremity Assessment: Generalized weakness    Lower Extremity Assessment Lower Extremity Assessment: Generalized weakness (L ankle fused)       Communication   Communication: No difficulties  Cognition Arousal/Alertness: Awake/alert Behavior During Therapy: WFL for tasks assessed/performed Overall Cognitive Status: Within Functional Limits for tasks assessed                                        General Comments      Exercises     Assessment/Plan    PT Assessment Patient needs continued PT services  PT Problem List Decreased strength;Decreased activity tolerance;Decreased balance;Decreased range of motion;Decreased mobility;Decreased knowledge of use of DME;Obesity;Pain;Decreased safety awareness       PT Treatment Interventions DME instruction;Gait training;Stair training;Functional mobility training;Therapeutic activities;Therapeutic exercise;Balance training;Patient/family education    PT Goals (Current goals can be found in the Care Plan section)  Acute Rehab PT Goals Patient Stated Goal: Regain IND PT Goal Formulation: With patient Time For Goal Achievement: 11/10/20 Potential to Achieve Goals: Good    Frequency Min 3X/week   Barriers to discharge Decreased caregiver support Pt is high fall risk, does not have 24/7    Co-evaluation               AM-PAC PT "6 Clicks" Mobility  Outcome Measure Help needed turning from your back to your side while in a flat bed without using bedrails?: None Help needed moving from lying on your back to sitting on the side of a flat bed without using bedrails?: A Little Help needed moving to and from a bed to a chair (including a wheelchair)?: A Little Help needed standing up from a chair using your arms (e.g., wheelchair or bedside chair)?: A Little Help needed to walk in  hospital room?: A Lot Help needed climbing 3-5 steps with a railing? : A Lot 6 Click Score: 17    End of Session Equipment Utilized During Treatment: Gait belt Activity Tolerance: Patient tolerated treatment well;Patient limited by fatigue Patient left: in chair;with call bell/phone within reach;with chair alarm set;with nursing/sitter in room Nurse Communication: Mobility status PT Visit Diagnosis: Unsteadiness on feet (R26.81);Muscle weakness (generalized) (M62.81);History of falling (Z91.81)    Time: 0935-1001 PT Time Calculation (min) (ACUTE ONLY): 26 min   Charges:   PT Evaluation $PT Eval Low Complexity: 1 Low PT Treatments $Gait Training: 8-22 mins        [place] Pager [phone] Office [phone]   Anadalay Macdonell 10/27/2020, 10:14 AM

## 2020-10-28 LAB — CBC WITH DIFFERENTIAL/PLATELET
Abs Immature Granulocytes: 0.05 10*3/uL (ref 0.00–0.07)
Basophils Absolute: 0 10*3/uL (ref 0.0–0.1)
Basophils Relative: 0 %
Eosinophils Absolute: 0.2 10*3/uL (ref 0.0–0.5)
Eosinophils Relative: 2 %
HCT: 59.1 % — ABNORMAL HIGH (ref 39.0–52.0)
Hemoglobin: 19.4 g/dL — ABNORMAL HIGH (ref 13.0–17.0)
Immature Granulocytes: 1 %
Lymphocytes Relative: 7 %
Lymphs Abs: 0.6 10*3/uL — ABNORMAL LOW (ref 0.7–4.0)
MCH: 28.6 pg (ref 26.0–34.0)
MCHC: 32.8 g/dL (ref 30.0–36.0)
MCV: 87.2 fL (ref 80.0–100.0)
Monocytes Absolute: 0.7 10*3/uL (ref 0.1–1.0)
Monocytes Relative: 8 %
Neutro Abs: 7.1 10*3/uL (ref 1.7–7.7)
Neutrophils Relative %: 82 %
Platelets: 194 10*3/uL (ref 150–400)
RBC: 6.78 MIL/uL — ABNORMAL HIGH (ref 4.22–5.81)
RDW: 14.5 % (ref 11.5–15.5)
WBC: 8.7 10*3/uL (ref 4.0–10.5)
nRBC: 0 % (ref 0.0–0.2)

## 2020-10-28 LAB — COMPREHENSIVE METABOLIC PANEL
ALT: 24 U/L (ref 0–44)
AST: 17 U/L (ref 15–41)
Albumin: 3.9 g/dL (ref 3.5–5.0)
Alkaline Phosphatase: 91 U/L (ref 38–126)
Anion gap: 11 (ref 5–15)
BUN: 36 mg/dL — ABNORMAL HIGH (ref 8–23)
CO2: 23 mmol/L (ref 22–32)
Calcium: 9.6 mg/dL (ref 8.9–10.3)
Chloride: 100 mmol/L (ref 98–111)
Creatinine, Ser: 1.51 mg/dL — ABNORMAL HIGH (ref 0.61–1.24)
GFR, Estimated: 51 mL/min — ABNORMAL LOW (ref >60)
Glucose, Bld: 141 mg/dL — ABNORMAL HIGH (ref 70–99)
Potassium: 4.2 mmol/L (ref 3.5–5.1)
Sodium: 134 mmol/L — ABNORMAL LOW (ref 135–145)
Total Bilirubin: 1 mg/dL (ref 0.3–1.2)
Total Protein: 7.4 g/dL (ref 6.5–8.1)

## 2020-10-28 LAB — GLUCOSE, CAPILLARY
Glucose-Capillary: 148 mg/dL — ABNORMAL HIGH (ref 70–99)
Glucose-Capillary: 161 mg/dL — ABNORMAL HIGH (ref 70–99)
Glucose-Capillary: 274 mg/dL — ABNORMAL HIGH (ref 70–99)
Glucose-Capillary: 97 mg/dL (ref 70–99)

## 2020-10-28 LAB — MAGNESIUM: Magnesium: 1.9 mg/dL (ref 1.7–2.4)

## 2020-10-28 NOTE — Progress Notes (Signed)
PROGRESS NOTE    [name]  [id number] DOB: [birth year] DOA: 10/25/2020 PCP: [name], [name]    Brief Narrative:  65 year old gentleman with history of prostate cancer with recurrence, intermittent urinary retention, CKD stage III AAA, type 2 diabetes on oral hypoglycemics, hyperlipidemia, macular degeneration and hypertension presented to the emergency room with low back pain, dizziness and lightheadedness.  Also with frequent falls at home.  In the emergency room blood pressure 202/109.  Heart rate 125.  95% room air.  Influenza and COVID-19 negative.  Urinalysis normal.  CT scan abdomen pelvis showed moderate prostate enlargement.  MRI of the brain was done that was essentially normal for any acute findings.  Admitted to hospital with elevated blood pressures and urinary obstruction.  8/12, patient developed florid evidence of alcohol withdrawal and confusion.  Discharge discontinued and working up for a skilled nursing facility.   Assessment & Plan:   Principal Problem:   Hypertensive urgency, malignant Active Problems:   Dyslipidemia associated with type 2 diabetes mellitus (HCC)   Urinary urgency   Dizziness   Acute renal failure superimposed on stage 3a chronic kidney disease (HCC)   Adjustment disorder with mixed anxiety and depressed mood   Acute urinary retention  Hypertensive urgency: Presentation blood pressure more than 200.  Treated with as needed medications and resumed his home doses of Benicar and beta-blocker.  Blood pressure already appropriately decreasing.  Will monitor.  Will not need more blood pressure medications. With concern about dizziness, MRI of the brain was done that is incessantly normal. He was not taking metoprolol at home.  Resumed.  Recurrent urinary obstruction: Due to prostatomegaly from prostate cancer.  He is doing occasional straight cath at home.  Does not have enough support system.  Failed voiding trial.   Foley catheter inserted.   Discharged with Foley catheter. He does have urologist at [facility] and has an appointment next week.  Discharging with Foley catheter.  He is aware about catheter care.  AKI on CKD3a: Due to #1.  Adequately improved to his baseline.  Creatinine 1.5.   At about his baseline.  Type 2 diabetes with CKD3a: On semaglutide and Tradjenta at home.  Well controlled.  Currently remains on sliding scale insulin.  Continue.  Resume on discharge.  Anxiety/ depression: Uses trazodone at night.  Continued.  Alcohol abuse with alcohol withdrawal: Patient developed alcohol withdrawal syndrome with confusion, agitation and delirium 8/12. Started on CIWA protocol.  Benzodiazepine.  Fall precautions.  Delirium precautions.  Multivitamins.  Hemodynamically stable. Patient may need soft restraints to avoid from injury trauma and to protect IV lines. Will continue to counsel. Today patient tells me that he drinks 6 pack of beer a day.   DVT prophylaxis: enoxaparin (LOVENOX) injection 40 mg Start: 10/25/20 2000   Code Status: Full code Family Communication: None. Disposition Plan: Status is: Inpatient  Remains inpatient appropriate because:Inpatient level of care appropriate due to severity of illness  Dispo: The patient is from: Home              Anticipated d/c is to: Skilled nursing facility.  If unable will need home health PT OT.              Patient currently i is not medically stable.   Difficult to place patient No    Consultants:  None  Procedures:  None  Antimicrobials:  None   Subjective: Patient seen and examined.  He tells me when he is able to  go home.  He is asking me to remove his restraints so he can walk and able to go home.  He is with pressured speech and anxious but fairly comfortable at rest.  Was recently given 2 mg of Ativan.  No other overnight events.   Objective: Vitals:   10/27/20 2011 10/28/20 0617 10/28/20 0900 10/28/20 1324  BP: (!) 142/92 (!) 141/93 (!)  144/96 121/82  Pulse: 66 83 88 80  Resp: '18 16  17  '$ Temp: (!) 97.1 F (36.2 C) 98 F (36.7 C)  98 F (36.7 C)  TempSrc:    Oral  SpO2: 97% 96%  100%  Weight:      Height:        Intake/Output Summary (Last 24 hours) at 10/28/2020 1350 Last data filed at 10/28/2020 0713 Gross per 24 hour  Intake --  Output 2250 ml  Net -2250 ml   Filed Weights   10/25/20 1038  Weight: 77 kg    Examination:  General exam: In mildly anxious and tremulous.  Not in any distress. Respiratory system: Clear to auscultation. Respiratory effort normal. Cardiovascular system: S1 & S2 heard, RRR. No JVD, murmurs, rubs, gallops or clicks. No pedal edema. Gastrointestinal system: Abdomen is nondistended, soft and nontender. No organomegaly or masses felt. Normal bowel sounds heard. Central nervous system: Alert and oriented x2-3.  No focal logical deficit.  Tremulousness present. Extremities: Symmetric 5 x 5 power. Skin: No rashes, lesions or ulcers Psychiatry: Judgement and insight appear compromised.  Mood is anxious and affect is flat.     Data Reviewed: I have personally reviewed following labs and imaging studies  CBC: Recent Labs  Lab 10/25/20 1350 10/25/20 1424 10/25/20 2010 10/26/20 0506 10/28/20 0712  WBC 10.1  --  11.2* 8.8 8.7  NEUTROABS 8.3*  --   --   --  7.1  HGB 18.9* 19.0* 18.5* 18.4* 19.4*  HCT 57.1* 56.0* 55.9* 58.2* 59.1*  MCV 87.2  --  86.3 90.7 87.2  PLT 215  --  199 166 [id number]   Basic Metabolic Panel: Recent Labs  Lab 10/25/20 1350 10/25/20 1424 10/25/20 2010 10/26/20 0506 10/27/20 0537 10/28/20 0712  NA 139 141  --  136 135 134*  K 4.6 4.5  --  4.5 4.6 4.2  CL 104 103  --  103 100 100  CO2 22  --   --  '22 25 23  '$ GLUCOSE 156* 159*  --  217* 147* 141*  BUN 30* 31*  --  29* 46* 36*  CREATININE 1.72* 1.60* 1.67* 1.61* 1.90* 1.51*  CALCIUM 9.7  --   --  9.3 9.5 9.6  MG  --   --   --   --  2.3 1.9  PHOS  --   --   --   --  4.4  --    GFR: Estimated Creatinine  Clearance: 47 mL/min (A) (by C-G formula based on SCr of 1.51 mg/dL (H)). Liver Function Tests: Recent Labs  Lab 10/25/20 1350 10/26/20 0506 10/28/20 0712  AST '22 23 17  '$ ALT 33 26 24  ALKPHOS 101 90 91  BILITOT 0.4 1.1 1.0  PROT 7.6 7.1 7.4  ALBUMIN 4.1 3.8 3.9   Recent Labs  Lab 10/25/20 1350  LIPASE 32   No results for input(s): AMMONIA in the last 168 hours. Coagulation Profile: No results for input(s): INR, PROTIME in the last 168 hours. Cardiac Enzymes: No results for input(s): CKTOTAL, CKMB, CKMBINDEX, TROPONINI in the last  168 hours. BNP (last 3 results) No results for input(s): PROBNP in the last 8760 hours. HbA1C: No results for input(s): HGBA1C in the last 72 hours. CBG: Recent Labs  Lab 10/27/20 1212 10/27/20 1657 10/27/20 2007 10/28/20 0751 10/28/20 1135  GLUCAP 147* 167* 100* 148* 161*   Lipid Profile: No results for input(s): CHOL, HDL, LDLCALC, TRIG, CHOLHDL, LDLDIRECT in the last 72 hours. Thyroid Function Tests: No results for input(s): TSH, T4TOTAL, FREET4, T3FREE, THYROIDAB in the last 72 hours. Anemia Panel: No results for input(s): VITAMINB12, FOLATE, FERRITIN, TIBC, IRON, RETICCTPCT in the last 72 hours. Sepsis Labs: No results for input(s): PROCALCITON, LATICACIDVEN in the last 168 hours.  Recent Results (from the past 240 hour(s))  Resp Panel by RT-PCR (Flu A&B, Covid) Nasopharyngeal Swab     Status: None   Collection Time: 10/25/20  4:51 PM   Specimen: Nasopharyngeal Swab; Nasopharyngeal(NP) swabs in vial transport medium  Result Value Ref Range Status   SARS Coronavirus 2 by RT PCR NEGATIVE NEGATIVE Final    Comment: (NOTE) SARS-CoV-2 target nucleic acids are NOT DETECTED.  The SARS-CoV-2 RNA is generally detectable in upper respiratory specimens during the acute phase of infection. The lowest concentration of SARS-CoV-2 viral copies this assay can detect is 138 copies/mL. A negative result does not preclude SARS-Cov-2 infection  and should not be used as the sole basis for treatment or other patient management decisions. A negative result may occur with  improper specimen collection/handling, submission of specimen other than nasopharyngeal swab, presence of viral mutation(s) within the areas targeted by this assay, and inadequate number of viral copies(<138 copies/mL). A negative result must be combined with clinical observations, patient history, and epidemiological information. The expected result is Negative.  Fact Sheet for Patients:  EntrepreneurPulse.com.au  Fact Sheet for Healthcare Providers:  IncredibleEmployment.be  This test is no t yet approved or cleared by the [place] FDA and  has been authorized for detection and/or diagnosis of SARS-CoV-2 by FDA under an Emergency Use Authorization (EUA). This EUA will remain  in effect (meaning this test can be used) for the duration of the COVID-19 declaration under Section 564(b)(1) of the Act, 21 U.S.C.section 360bbb-3(b)(1), unless the authorization is terminated  or revoked sooner.       Influenza A by PCR NEGATIVE NEGATIVE Final   Influenza B by PCR NEGATIVE NEGATIVE Final    Comment: (NOTE) The Xpert Xpress SARS-CoV-2/FLU/RSV plus assay is intended as an aid in the diagnosis of influenza from Nasopharyngeal swab specimens and should not be used as a sole basis for treatment. Nasal washings and aspirates are unacceptable for Xpert Xpress SARS-CoV-2/FLU/RSV testing.  Fact Sheet for Patients: EntrepreneurPulse.com.au  Fact Sheet for Healthcare Providers: IncredibleEmployment.be  This test is not yet approved or cleared by the [place] FDA and has been authorized for detection and/or diagnosis of SARS-CoV-2 by FDA under an Emergency Use Authorization (EUA). This EUA will remain in effect (meaning this test can be used) for the duration of the COVID-19 declaration  under Section 564(b)(1) of the Act, 21 U.S.C. section 360bbb-3(b)(1), unless the authorization is terminated or revoked.  Performed at [facility], [place] [address] [place], [place] [address]          Radiology Studies: No results found.      Scheduled Meds:  aspirin EC  81 mg Oral Q breakfast   atorvastatin  40 mg Oral Daily   Chlorhexidine Gluconate Cloth  6 each Topical Daily   clotrimazole-betamethasone  1 application Topical BID   enoxaparin (LOVENOX) injection  40 mg Subcutaneous [id number]   folic acid  1 mg Oral Daily   irbesartan  300 mg Oral Daily   And   hydrochlorothiazide  25 mg Oral Daily   insulin aspart  0-5 Units Subcutaneous QHS   insulin aspart  0-9 Units Subcutaneous TID WC   metoprolol tartrate  50 mg Oral BID   mirabegron ER  25 mg Oral Daily   multivitamin with minerals  1 tablet Oral Daily   thiamine  100 mg Oral Daily   Or   thiamine  100 mg Intravenous Daily   Continuous Infusions:   LOS: 3 days    Time spent: 25 minutes    Barb Merino, MD [facility] Pager [phone]

## 2020-10-28 NOTE — Progress Notes (Signed)
Patient repeating questions and not understanding staff when questions answered. Very confused to where he is and why. Yelling out, pulling off restraints, pulling at foley catheter. Given PRN ativan 0.'5mg'$  with no relief. Given PRN ativan '2mg'$  with pending results. Will continue to monitor patient. Restraints readjusted for comfort, no skin breakdown. Foley catheter in place and patent. Will continue to follow CIWA protocols.

## 2020-10-29 LAB — GLUCOSE, CAPILLARY
Glucose-Capillary: 98 mg/dL (ref 70–99)
Glucose-Capillary: 295 mg/dL — ABNORMAL HIGH (ref 70–99)
Glucose-Capillary: 136 mg/dL — ABNORMAL HIGH (ref 70–99)
Glucose-Capillary: 196 mg/dL — ABNORMAL HIGH (ref 70–99)

## 2020-10-29 LAB — COMPREHENSIVE METABOLIC PANEL
ALT: 23 U/L (ref 0–44)
AST: 20 U/L (ref 15–41)
Albumin: 3.8 g/dL (ref 3.5–5.0)
Alkaline Phosphatase: 108 U/L (ref 38–126)
Anion gap: 13 (ref 5–15)
BUN: 40 mg/dL — ABNORMAL HIGH (ref 8–23)
CO2: 23 mmol/L (ref 22–32)
Calcium: 10 mg/dL (ref 8.9–10.3)
Chloride: 100 mmol/L (ref 98–111)
Creatinine, Ser: 1.69 mg/dL — ABNORMAL HIGH (ref 0.61–1.24)
GFR, Estimated: 44 mL/min — ABNORMAL LOW (ref >60)
Glucose, Bld: 77 mg/dL (ref 70–99)
Potassium: 4.6 mmol/L (ref 3.5–5.1)
Sodium: 136 mmol/L (ref 135–145)
Total Bilirubin: 1 mg/dL (ref 0.3–1.2)
Total Protein: 7.7 g/dL (ref 6.5–8.1)

## 2020-10-29 NOTE — Progress Notes (Signed)
PROGRESS NOTE    [name]  [id number] DOB: [birth year] DOA: 10/25/2020 PCP: [name], [name]    Brief Narrative:  65 year old gentleman with history of prostate cancer with recurrence, intermittent urinary retention, CKD stage III AAA, type 2 diabetes on oral hypoglycemics, hyperlipidemia, macular degeneration and hypertension presented to the emergency room with low back pain, dizziness and lightheadedness.  Also with frequent falls at home.  In the emergency room blood pressure 202/109.  Heart rate 125.  95% room air.  Influenza and COVID-19 negative.  Urinalysis normal.  CT scan abdomen pelvis showed moderate prostate enlargement.  MRI of the brain was done that was essentially normal for any acute findings.  Admitted to hospital with elevated blood pressures and urinary obstruction.  8/12, patient developed florid evidence of alcohol withdrawal and confusion.  Discharge discontinued and working up for a skilled nursing facility.  8/14 pt in hand mittens. Apparently after I saw him, he pulled out his foley. Scored on CIWA protocal last night received 3 mg and haldol too . This am somewhat confused.  Assessment & Plan:   Principal Problem:   Hypertensive urgency, malignant Active Problems:   Dyslipidemia associated with type 2 diabetes mellitus (HCC)   Urinary urgency   Dizziness   Acute renal failure superimposed on stage 3a chronic kidney disease (HCC)   Adjustment disorder with mixed anxiety and depressed mood   Acute urinary retention  Hypertensive urgency: Presentation blood pressure more than 200.  Treated with as needed medications and resumed his home doses of Benicar and beta-blocker.  Blood pressure already appropriately decreasing.  Will monitor.  Will not need more blood pressure medications. With concern about dizziness, MRI of the brain was done that is incessantly normal. 8/14 continue ARB and beta-blockers.  Recurrent urinary obstruction: Due to  prostatomegaly from prostate cancer.  He is doing occasional straight cath at home.  Does not have enough support system.  Failed voiding trial.   Foley catheter inserted.  Discharged with Foley catheter. He does have urologist at [facility] and has an appointment next week.  Discharging with Foley catheter.  8/14 he pulled out Foley catheter today.  We will BladderScan if consistently elevated more than 250 cc x 2 instructed nursing to replace the Foley  AKI on CKD3a: Due to #1.   At baseline  Monitor     Type 2 diabetes with CKD3a: On semaglutide and Tradjenta at home.  Well controlled.   8/14 continue R-ISS  Resume home meds on discharge    Anxiety/ depression: Continue trazodone at night    Alcohol abuse with alcohol withdrawal: Patient developed alcohol withdrawal syndrome with confusion, agitation and delirium 8/12. Started on CIWA protocol.  Benzodiazepine.  Fall precautions.  Delirium precautions.  Multivitamins.  Hemodynamically stable. Patient may need soft restraints to avoid from injury trauma and to protect IV lines. 8/14 was counseled during this hospitalization.  Apparently he told previous hospitalist he drinks 6 pack of beer per day Is scoring on protocal requiring ativan. Continue monitoring on protocal and tx as needed.     DVT prophylaxis: enoxaparin (LOVENOX) injection 40 mg Start: 10/25/20 2000   Code Status: Full code Family Communication: None. Disposition Plan: Status is: Inpatient  Remains inpatient appropriate because:Inpatient level of care appropriate due to severity of illness  Dispo: The patient is from: Home              Anticipated d/c is to: Skilled nursing facility.  If unable will  need home health PT OT.              Patient currently i is not medically stable.   Difficult to place patient No    Consultants:  None  Procedures:  None  Antimicrobials:  None   Subjective: Confused a bit. Denies sob, cp. Wants to go home.     Objective: Vitals:   10/28/20 0900 10/28/20 1324 10/28/20 2024 10/29/20 0429  BP: (!) 144/96 121/82 102/74 (!) 134/93  Pulse: 88 80 65 68  Resp:  '17 16 16  '$ Temp:  98 F (36.7 C) 98.4 F (36.9 C) (!) 97.5 F (36.4 C)  TempSrc:  Oral Oral Oral  SpO2:  100% 94% 100%  Weight:      Height:        Intake/Output Summary (Last 24 hours) at 10/29/2020 0825 Last data filed at 10/29/2020 0436 Gross per 24 hour  Intake 40 ml  Output 725 ml  Net -685 ml   Filed Weights   10/25/20 1038  Weight: 77 kg    Examination: Nad, mildly confused Cta no w/r Regular s1/s 2 no gallop Soft benign positive bowel sounds No edema Aaxox2, not to person. But also had to answer multiple times to get other questions correct Mood and affect appropriate in current setting    Data Reviewed: I have personally reviewed following labs and imaging studies  CBC: Recent Labs  Lab 10/25/20 1350 10/25/20 1424 10/25/20 2010 10/26/20 0506 10/28/20 0712  WBC 10.1  --  11.2* 8.8 8.7  NEUTROABS 8.3*  --   --   --  7.1  HGB 18.9* 19.0* 18.5* 18.4* 19.4*  HCT 57.1* 56.0* 55.9* 58.2* 59.1*  MCV 87.2  --  86.3 90.7 87.2  PLT 215  --  199 166 [id number]   Basic Metabolic Panel: Recent Labs  Lab 10/25/20 1350 10/25/20 1424 10/25/20 2010 10/26/20 0506 10/27/20 0537 10/28/20 0712 10/29/20 0537  NA 139 141  --  136 135 134* 136  K 4.6 4.5  --  4.5 4.6 4.2 4.6  CL 104 103  --  103 100 100 100  CO2 22  --   --  '22 25 23 23  '$ GLUCOSE 156* 159*  --  217* 147* 141* 77  BUN 30* 31*  --  29* 46* 36* 40*  CREATININE 1.72* 1.60* 1.67* 1.61* 1.90* 1.51* 1.69*  CALCIUM 9.7  --   --  9.3 9.5 9.6 10.0  MG  --   --   --   --  2.3 1.9  --   PHOS  --   --   --   --  4.4  --   --    GFR: Estimated Creatinine Clearance: 42 mL/min (A) (by C-G formula based on SCr of 1.69 mg/dL (H)). Liver Function Tests: Recent Labs  Lab 10/25/20 1350 10/26/20 0506 10/28/20 0712 10/29/20 0537  AST '22 23 17 20  '$ ALT 33 '26 24 23   '$ ALKPHOS 101 90 91 108  BILITOT 0.4 1.1 1.0 1.0  PROT 7.6 7.1 7.4 7.7  ALBUMIN 4.1 3.8 3.9 3.8   Recent Labs  Lab 10/25/20 1350  LIPASE 32   No results for input(s): AMMONIA in the last 168 hours. Coagulation Profile: No results for input(s): INR, PROTIME in the last 168 hours. Cardiac Enzymes: No results for input(s): CKTOTAL, CKMB, CKMBINDEX, TROPONINI in the last 168 hours. BNP (last 3 results) No results for input(s): PROBNP in the last 8760 hours. HbA1C: No  results for input(s): HGBA1C in the last 72 hours. CBG: Recent Labs  Lab 10/28/20 0751 10/28/20 1135 10/28/20 1615 10/28/20 2026 10/29/20 0740  GLUCAP 148* 161* 274* 97 98   Lipid Profile: No results for input(s): CHOL, HDL, LDLCALC, TRIG, CHOLHDL, LDLDIRECT in the last 72 hours. Thyroid Function Tests: No results for input(s): TSH, T4TOTAL, FREET4, T3FREE, THYROIDAB in the last 72 hours. Anemia Panel: No results for input(s): VITAMINB12, FOLATE, FERRITIN, TIBC, IRON, RETICCTPCT in the last 72 hours. Sepsis Labs: No results for input(s): PROCALCITON, LATICACIDVEN in the last 168 hours.  Recent Results (from the past 240 hour(s))  Resp Panel by RT-PCR (Flu A&B, Covid) Nasopharyngeal Swab     Status: None   Collection Time: 10/25/20  4:51 PM   Specimen: Nasopharyngeal Swab; Nasopharyngeal(NP) swabs in vial transport medium  Result Value Ref Range Status   SARS Coronavirus 2 by RT PCR NEGATIVE NEGATIVE Final    Comment: (NOTE) SARS-CoV-2 target nucleic acids are NOT DETECTED.  The SARS-CoV-2 RNA is generally detectable in upper respiratory specimens during the acute phase of infection. The lowest concentration of SARS-CoV-2 viral copies this assay can detect is 138 copies/mL. A negative result does not preclude SARS-Cov-2 infection and should not be used as the sole basis for treatment or other patient management decisions. A negative result may occur with  improper specimen collection/handling, submission  of specimen other than nasopharyngeal swab, presence of viral mutation(s) within the areas targeted by this assay, and inadequate number of viral copies(<138 copies/mL). A negative result must be combined with clinical observations, patient history, and epidemiological information. The expected result is Negative.  Fact Sheet for Patients:  EntrepreneurPulse.com.au  Fact Sheet for Healthcare Providers:  IncredibleEmployment.be  This test is no t yet approved or cleared by the [place] FDA and  has been authorized for detection and/or diagnosis of SARS-CoV-2 by FDA under an Emergency Use Authorization (EUA). This EUA will remain  in effect (meaning this test can be used) for the duration of the COVID-19 declaration under Section 564(b)(1) of the Act, 21 U.S.C.section 360bbb-3(b)(1), unless the authorization is terminated  or revoked sooner.       Influenza A by PCR NEGATIVE NEGATIVE Final   Influenza B by PCR NEGATIVE NEGATIVE Final    Comment: (NOTE) The Xpert Xpress SARS-CoV-2/FLU/RSV plus assay is intended as an aid in the diagnosis of influenza from Nasopharyngeal swab specimens and should not be used as a sole basis for treatment. Nasal washings and aspirates are unacceptable for Xpert Xpress SARS-CoV-2/FLU/RSV testing.  Fact Sheet for Patients: EntrepreneurPulse.com.au  Fact Sheet for Healthcare Providers: IncredibleEmployment.be  This test is not yet approved or cleared by the [place] FDA and has been authorized for detection and/or diagnosis of SARS-CoV-2 by FDA under an Emergency Use Authorization (EUA). This EUA will remain in effect (meaning this test can be used) for the duration of the COVID-19 declaration under Section 564(b)(1) of the Act, 21 U.S.C. section 360bbb-3(b)(1), unless the authorization is terminated or revoked.  Performed at [facility], [place]  [address], [place], [place] [address]          Radiology Studies: No results found.      Scheduled Meds:  aspirin EC  81 mg Oral Q breakfast   atorvastatin  40 mg Oral Daily   Chlorhexidine Gluconate Cloth  6 each Topical Daily   clotrimazole-betamethasone  1 application Topical BID   enoxaparin (LOVENOX) injection  40 mg Subcutaneous [id number]   folic  acid  1 mg Oral Daily   irbesartan  300 mg Oral Daily   And   hydrochlorothiazide  25 mg Oral Daily   insulin aspart  0-5 Units Subcutaneous QHS   insulin aspart  0-9 Units Subcutaneous TID WC   metoprolol tartrate  50 mg Oral BID   mirabegron ER  25 mg Oral Daily   multivitamin with minerals  1 tablet Oral Daily   thiamine  100 mg Oral Daily   Or   thiamine  100 mg Intravenous Daily   Continuous Infusions:   LOS: 4 days    Time spent: 35 minutes with more than 50% on [place], MD [facility] Pager [phone]

## 2020-10-30 ENCOUNTER — Ambulatory Visit: Payer: Self-pay

## 2020-10-30 DIAGNOSIS — E1121 Type 2 diabetes mellitus with diabetic nephropathy: Secondary | ICD-10-CM

## 2020-10-30 DIAGNOSIS — I160 Hypertensive urgency: Secondary | ICD-10-CM

## 2020-10-30 DIAGNOSIS — I10 Essential (primary) hypertension: Secondary | ICD-10-CM

## 2020-10-30 LAB — COMPREHENSIVE METABOLIC PANEL
ALT: 24 U/L (ref 0–44)
AST: 18 U/L (ref 15–41)
Albumin: 3.8 g/dL (ref 3.5–5.0)
Alkaline Phosphatase: 102 U/L (ref 38–126)
Anion gap: 9 (ref 5–15)
BUN: 63 mg/dL — ABNORMAL HIGH (ref 8–23)
CO2: 23 mmol/L (ref 22–32)
Calcium: 9.6 mg/dL (ref 8.9–10.3)
Chloride: 102 mmol/L (ref 98–111)
Creatinine, Ser: 2.1 mg/dL — ABNORMAL HIGH (ref 0.61–1.24)
GFR, Estimated: 34 mL/min — ABNORMAL LOW (ref >60)
Glucose, Bld: 129 mg/dL — ABNORMAL HIGH (ref 70–99)
Potassium: 4.4 mmol/L (ref 3.5–5.1)
Sodium: 134 mmol/L — ABNORMAL LOW (ref 135–145)
Total Bilirubin: 1.2 mg/dL (ref 0.3–1.2)
Total Protein: 7.4 g/dL (ref 6.5–8.1)

## 2020-10-30 LAB — GLUCOSE, CAPILLARY: Glucose-Capillary: 129 mg/dL — ABNORMAL HIGH (ref 70–99)

## 2020-10-30 NOTE — Progress Notes (Signed)
Patient was informed by a friend that if there was not court order the hospital and staff could not keep him and that he could sign himself out. Patient informed this nurse that if he there was no court order he wanted to leave. He stated "that his girlfriend is going to come get him and take him home with her and take care of him". I explained to him that if he signed out AMA he would have to walk out of the hospital on his own. He stated that he could do. I consulted w/ Dr. [place] in progression and he stated that patient could leave AMA. Patient signed AMA forms and called his friend and they called a cab to come get him. I removed his IV. Patient walked from room to elevator on his own.

## 2020-10-30 NOTE — Discharge Summary (Signed)
[facility] Physician Discharge Summary  [name] [id number] DOB: 09/05/1955 DOA: 10/25/2020  PCP: [name], [name]  Admit date: 10/25/2020 Discharge date: 10/30/2020  Admitted From: Home Disposition: Left AGAINST MEDICAL ADVICE   History of present illness:  [name] is a 65 year old male with past medical history significant for prostate cancer with recurrence, intermittent urinary retention, CKD stage IIIa, AAA, type 2 diabetes mellitus on oral hypoglycemics, hyperlipidemia, macular degeneration, essential hypertension who presented to [facility] ED with complaint of low back pain, dizziness, lightheadedness.  Also with frequent falls at home.  In the ED, patient was noted to have a BP 202/109, HR 125.  Oxygenating well on room air.  COVID-19 PCR negative.  Influenza A/B PCR negative.  Urinalysis within normal limits.  CT abdomen/pelvis with moderate prostate enlargement.  MRI brain negative for acute findings.  EDP consulted here and for further evaluation management of hypertensive urgency with findings of urinary obstruction.   Hospital course:  Hypertensive urgency Patient presenting with a BP of 202/109 in the setting of headache, dizziness and frequent falls.  MRI brain unrevealing.  Patient was restarted on his home Benicar and beta-blocker with appropriate improvement of his blood pressure.  Unfortunately, patient left AMA before being seen by myself this morning.  Recurrent urinary obstruction Etiology likely secondary to prostamegaly from prostate cancer.  Was prior doing occasional straight caths at home.  He failed voiding trial while inpatient in which Foley catheter was inserted.  Unfortunately patient on 10/29/2020 pulled out his Foley catheter.  Patient left AMA as above and has urologist in [facility] with upcoming appointment.  AKI on CKD stage IIIa Creatinine 2.10 at time of discharge.  Recommend BMP at next specialist/PCP visit.  Type 2 diabetes mellitus On semaglutide  and Tradjenta at home.  Anxiety/depression: Continue trazodone  EtOH use disorder with alcohol withdrawal During hospitalization, patient started developing confusion, agitation delirium on 8/12 and was started on CIWA protocol.  Patient was given as needed benzodiazepines with resolution of symptoms on 10/29/2020.  Patient apparently drinks roughly 6 pack of beer per day.  It was discussed by several providers that he needs assayed from alcohol use; as this is likely a contributing factor to his multitude of symptoms.  Reasonable efforts were made to advise the patient of the benefit of staying for evaluation as well as treatment. The patient had the decision-making capacity to refuse treatment at this time. We discussed the associated risks of leaving the hospital prior to completion of workup and treatment; and the patient voiced understanding. We discussed alternatives to current care plan, and the patient still voiced their decision to refuse treatment. Questions were answered and instructions for need of close follow-up was discussed. The AGAINST MEDICAL ADVICE paperwork was signed prior to departure from the hospital.   Discharge Diagnoses:  Principal Problem:   Hypertensive urgency, malignant Active Problems:   Dyslipidemia associated with type 2 diabetes mellitus ([place])   Urinary urgency   Dizziness   Acute renal failure superimposed on stage 3a chronic kidney disease (HCC)   Adjustment disorder with mixed anxiety and depressed mood   Acute urinary retention    Discharge Instructions  Discharge Instructions     Diet - low sodium heart healthy   Complete by: As directed    Diet Carb Modified   Complete by: As directed    Discharge instructions   Complete by: As directed    Continue to do straight cath at least 2-3 times a  day at home as you are doing. Schedule follow up with your Urology at [place].   Increase activity slowly   Complete by: As directed        Allergies as of 10/30/2020   No Known Allergies      Medication List     STOP taking these medications    Rybelsus 14 MG Tabs Generic drug: Semaglutide       TAKE these medications    acetaminophen 325 MG tablet Commonly known as: TYLENOL Take 2 tablets (650 mg total) by mouth every 6 (six) hours as needed for mild pain (or Fever >/= 101).   aspirin EC 81 MG tablet Take 81 mg by mouth daily with breakfast.   atorvastatin 40 MG tablet Commonly known as: LIPITOR 1 tablet by mouth Monday - Friday What changed:  how much to take how to take this when to take this additional instructions   clotrimazole-betamethasone cream Commonly known as: LOTRISONE Apply 1 application topically 2 (two) times daily.   diclofenac Sodium 1 % Gel Commonly known as: Voltaren Apply 4 g topically 4 (four) times daily. What changed:  when to take this reasons to take this   metoprolol tartrate 50 MG tablet Commonly known as: LOPRESSOR Take 1 tablet (50 mg total) by mouth 2 (two) times daily.   Myrbetriq 25 MG Tb24 tablet Generic drug: mirabegron ER TAKE 1 TABLET BY MOUTH EVERY DAY What changed: how much to take   nystatin powder Commonly known as: nystatin Apply 1 application topically 3 (three) times daily. What changed:  when to take this reasons to take this   olmesartan-hydrochlorothiazide 40-25 MG tablet Commonly known as: BENICAR HCT Take 1 tablet by mouth daily.   sildenafil 20 MG tablet Commonly known as: REVATIO Take 20-100 mg by mouth daily as needed for erectile dysfunction.   Tradjenta 5 MG Tabs tablet Generic drug: linagliptin Take 5 mg by mouth daily.   traMADol 50 MG tablet Commonly known as: ULTRAM Take 1 tablet (50 mg total) by mouth every 6 (six) hours as needed for severe pain.   traZODone 50 MG tablet Commonly known as: DESYREL TAKE 1 TABLET (50 MG TOTAL) BY MOUTH AT BEDTIME AS NEEDED FOR SLEEP.   VISINE OP Place 1 drop into both eyes daily  as needed (itching/irritation).        Follow-up Information     [facility]. Schedule an appointment as soon as possible for a visit .   Why: As needed, If symptoms worsen Contact information: [place] [phone]               No Known Allergies  Consultations: None   Procedures/Studies: MR BRAIN WO CONTRAST  Result Date: 10/26/2020 CLINICAL DATA:  Initial evaluation for acute dizziness. EXAM: MRI HEAD WITHOUT CONTRAST TECHNIQUE: Multiplanar, multiecho pulse sequences of the brain and surrounding structures were obtained without intravenous contrast. COMPARISON:  MRI from 09/15/2020. FINDINGS: Brain: Generalized age-related cerebral atrophy with moderate chronic microvascular ischemic disease, stable. No abnormal foci of restricted diffusion to suggest acute or subacute ischemia. Gray-white matter differentiation maintained. No encephalomalacia to suggest chronic cortical infarction. No evidence for acute or chronic intracranial hemorrhage. No mass lesion, midline shift or mass effect. Mild ventricular prominence related to global parenchymal volume loss without hydrocephalus. No extra-axial fluid collection. Pituitary gland suprasellar region within normal limits. Midline structures intact. Vascular: Major intracranial vascular flow voids are maintained. Skull and upper cervical  spine: Craniocervical junction within normal limits. Bone marrow signal intensity normal. No focal marrow replacing lesion. No scalp soft tissue abnormality. Plagiocephaly noted. Sinuses/Orbits: Globes and orbital soft tissues within normal limits. Paranasal sinuses are largely clear. No mastoid effusion. Inner ear structures grossly normal. Other: None. IMPRESSION: 1. No acute intracranial abnormality. 2. Generalized age-related cerebral atrophy with moderate chronic microvascular ischemic disease, stable. Electronically Signed   By: Jeannine Boga M.D.   On: 10/26/2020 00:04   CT Abdomen Pelvis W Contrast  Result Date: 10/25/2020 CLINICAL DATA:  Acute generalized abdominal pain. History of prostate cancer. EXAM: CT ABDOMEN AND PELVIS WITH CONTRAST TECHNIQUE: Multidetector CT imaging of the abdomen and pelvis was performed using the standard protocol following bolus administration of intravenous contrast. CONTRAST:  1m OMNIPAQUE IOHEXOL 350 MG/ML SOLN COMPARISON:  February 10, 2020. FINDINGS: Lower chest: No acute abnormality. Hepatobiliary: No focal liver abnormality is seen. No gallstones, gallbladder wall thickening, or biliary dilatation. Pancreas: Unremarkable. No pancreatic ductal dilatation or surrounding inflammatory changes. Spleen: Normal in size without focal abnormality. Adrenals/Urinary Tract: Adrenal glands appear normal. Stable left renal cysts are noted. Stable right renal cortical atrophy is noted with moderate hydroureteronephrosis which extends to surgical site in midportion of right ureter. Urinary bladder is decompressed secondary to Foley catheter. No renal or ureteral calculi are noted. Stomach/Bowel: Stomach is within normal limits. Appendix appears normal. No evidence of bowel wall thickening, distention, or inflammatory changes. Vascular/Lymphatic: Aortic atherosclerosis. No enlarged abdominal or pelvic lymph nodes. Reproductive: Stable moderate prostatic enlargement is noted. Other: Small bilateral fat containing inguinal hernias are noted. No ascites is noted. Musculoskeletal: No acute or significant osseous findings. IMPRESSION: Stable moderate prostatic enlargement. Stable right renal cortical atrophy is noted with associated moderate hydroureteronephrosis which extends to surgical site midportion of right ureter. Urinary bladder is decompressed secondary to Foley catheter. Small bilateral fat containing inguinal hernias. Aortic Atherosclerosis (ICD10-I70.0). Electronically Signed   By: JMarijo ConceptionM.D.   On:  10/25/2020 15:40   UK[place]SCROTUM W/DOPPLER  Result Date: 10/25/2020 CLINICAL DATA:  Left greater than right scrotal pain EXAM: SCROTAL ULTRASOUND DOPPLER ULTRASOUND OF THE TESTICLES TECHNIQUE: Complete ultrasound examination of the testicles, epididymis, and other scrotal structures was performed. Color and spectral Doppler ultrasound were also utilized to evaluate blood flow to the testicles. COMPARISON:  Ultrasound 09/15/2020 FINDINGS: Right testicle Measurements: 3.6 x 2.5 x 2.7 cm. No mass or microlithiasis visualized. Left testicle Measurements: 4.2 x 2.3 x 3 cm. No mass or microlithiasis visualized. Right epididymis:  Normal in size and appearance. Left epididymis:  Normal in size and appearance. Hydrocele: Small bilateral hydroceles. Particulate debris within the hydroceles. Varicocele:  None visualized. Pulsed Doppler interrogation of both testes demonstrates normal low resistance arterial and venous waveforms bilaterally. IMPRESSION: 1. Small bilateral hydroceles. 2. Negative for testicular torsion Electronically Signed   By: KDonavan FoilM.D.   On: 10/25/2020 17:55     Subjective: Patient left AGAINST MEDICAL ADVICE this morning.  Discharge Exam: Vitals:   10/29/20 1407 10/29/20 2042  BP: 107/71 99/61  Pulse: (!) 58 (!) 59  Resp: 17 16  Temp: 98 F (36.7 C) 98 F (36.7 C)  SpO2: 95% 97%   Vitals:   10/29/20 0429 10/29/20 0913 10/29/20 1407 10/29/20 2042  BP: (!) 134/93 (!) 141/96 107/71 99/61  Pulse: 68 83 (!) 58 (!) 59  Resp: '16  17 16  '$ Temp: (!) 97.5 F (36.4 C)  98 F (36.7 C) 98 F (36.7 C)  TempSrc: Oral   Oral  SpO2: 100%  95% 97%  Weight:      Height:        No exam was performed as patient left AMA before being seen this a.m.    The results of significant diagnostics from this hospitalization (including imaging, microbiology, ancillary and laboratory) are listed below for reference.     Microbiology: Recent Results (from the past 240 hour(s))  Resp Panel  by RT-PCR (Flu A&B, Covid) Nasopharyngeal Swab     Status: None   Collection Time: 10/25/20  4:51 PM   Specimen: Nasopharyngeal Swab; Nasopharyngeal(NP) swabs in vial transport medium  Result Value Ref Range Status   SARS Coronavirus 2 by RT PCR NEGATIVE NEGATIVE Final    Comment: (NOTE) SARS-CoV-2 target nucleic acids are NOT DETECTED.  The SARS-CoV-2 RNA is generally detectable in upper respiratory specimens during the acute phase of infection. The lowest concentration of SARS-CoV-2 viral copies this assay can detect is 138 copies/mL. A negative result does not preclude SARS-Cov-2 infection and should not be used as the sole basis for treatment or other patient management decisions. A negative result may occur with  improper specimen collection/handling, submission of specimen other than nasopharyngeal swab, presence of viral mutation(s) within the areas targeted by this assay, and inadequate number of viral copies(<138 copies/mL). A negative result must be combined with clinical observations, patient history, and epidemiological information. The expected result is Negative.  Fact Sheet for Patients:  EntrepreneurPulse.com.au  Fact Sheet for Healthcare Providers:  IncredibleEmployment.be  This test is no t yet approved or cleared by the [place] FDA and  has been authorized for detection and/or diagnosis of SARS-CoV-2 by FDA under an Emergency Use Authorization (EUA). This EUA will remain  in effect (meaning this test can be used) for the duration of the COVID-19 declaration under Section 564(b)(1) of the Act, 21 U.S.C.section 360bbb-3(b)(1), unless the authorization is terminated  or revoked sooner.       Influenza A by PCR NEGATIVE NEGATIVE Final   Influenza B by PCR NEGATIVE NEGATIVE Final    Comment: (NOTE) The Xpert Xpress SARS-CoV-2/FLU/RSV plus assay is intended as an aid in the diagnosis of influenza from Nasopharyngeal swab  specimens and should not be used as a sole basis for treatment. Nasal washings and aspirates are unacceptable for Xpert Xpress SARS-CoV-2/FLU/RSV testing.  Fact Sheet for Patients: EntrepreneurPulse.com.au  Fact Sheet for Healthcare Providers: IncredibleEmployment.be  This test is not yet approved or cleared by the [place] FDA and has been authorized for detection and/or diagnosis of SARS-CoV-2 by FDA under an Emergency Use Authorization (EUA). This EUA will remain in effect (meaning this test can be used) for the duration of the COVID-19 declaration under Section 564(b)(1) of the Act, 21 U.S.C. section 360bbb-3(b)(1), unless the authorization is terminated or revoked.  Performed at [facility], [place] [address] [place], [place] [address]      Labs: BNP (last 3 results) No results for input(s): BNP in the last 8760 hours. Basic Metabolic Panel: Recent Labs  Lab 10/26/20 0506 10/27/20 0537 10/28/20 0712 10/29/20 0537 10/30/20 0441  NA 136 135 134* 136 134*  K 4.5 4.6 4.2 4.6 4.4  CL 103 100 100 100 102  CO2 '22 25 23 23 23  '$ GLUCOSE 217* 147* 141* 77 129*  BUN 29* 46* 36* 40* 63*  CREATININE 1.61* 1.90* 1.51* 1.69* 2.10*  CALCIUM 9.3 9.5 9.6 10.0 9.6  MG  --  2.3 1.9  --   --  PHOS  --  4.4  --   --   --    Liver Function Tests: Recent Labs  Lab 10/25/20 1350 10/26/20 0506 10/28/20 0712 10/29/20 0537 10/30/20 0441  AST '22 23 17 20 18  '$ ALT 33 '26 24 23 24  '$ ALKPHOS 101 90 91 108 102  BILITOT 0.4 1.1 1.0 1.0 1.2  PROT 7.6 7.1 7.4 7.7 7.4  ALBUMIN 4.1 3.8 3.9 3.8 3.8   Recent Labs  Lab 10/25/20 1350  LIPASE 32   No results for input(s): AMMONIA in the last 168 hours. CBC: Recent Labs  Lab 10/25/20 1350 10/25/20 1424 10/25/20 2010 10/26/20 0506 10/28/20 0712  WBC 10.1  --  11.2* 8.8 8.7  NEUTROABS 8.3*  --   --   --  7.1  HGB 18.9* 19.0* 18.5* 18.4* 19.4*  HCT 57.1* 56.0* 55.9* 58.2*  59.1*  MCV 87.2  --  86.3 90.7 87.2  PLT 215  --  199 166 194   Cardiac Enzymes: No results for input(s): CKTOTAL, CKMB, CKMBINDEX, TROPONINI in the last 168 hours. BNP: Invalid input(s): POCBNP CBG: Recent Labs  Lab 10/29/20 0740 10/29/20 1223 10/29/20 1627 10/29/20 2044 10/30/20 0729  GLUCAP 98 295* 136* 196* 129*   D-Dimer No results for input(s): DDIMER in the last 72 hours. Hgb A1c No results for input(s): HGBA1C in the last 72 hours. Lipid Profile No results for input(s): CHOL, HDL, LDLCALC, TRIG, CHOLHDL, LDLDIRECT in the last 72 hours. Thyroid function studies No results for input(s): TSH, T4TOTAL, T3FREE, THYROIDAB in the last 72 hours.  Invalid input(s): FREET3 Anemia work up No results for input(s): VITAMINB12, FOLATE, FERRITIN, TIBC, IRON, RETICCTPCT in the last 72 hours. Urinalysis    Component Value Date/Time   COLORURINE YELLOW 10/25/2020 1350   APPEARANCEUR CLEAR 10/25/2020 1350   LABSPEC 1.015 10/25/2020 1350   PHURINE 6.0 10/25/2020 1350   GLUCOSEU >=500 (A) 10/25/2020 1350   HGBUR NEGATIVE 10/25/2020 1350   BILIRUBINUR NEGATIVE 10/25/2020 1350   BILIRUBINUR negative 08/26/2019 1516   KETONESUR NEGATIVE 10/25/2020 1350   PROTEINUR 100 (A) 10/25/2020 1350   UROBILINOGEN 0.2 08/26/2019 1516   UROBILINOGEN 0.2 04/18/2008 1153   NITRITE NEGATIVE 10/25/2020 1350   LEUKOCYTESUR NEGATIVE 10/25/2020 1350   Sepsis Labs Invalid input(s): PROCALCITONIN,  WBC,  LACTICIDVEN Microbiology Recent Results (from the past 240 hour(s))  Resp Panel by RT-PCR (Flu A&B, Covid) Nasopharyngeal Swab     Status: None   Collection Time: 10/25/20  4:51 PM   Specimen: Nasopharyngeal Swab; Nasopharyngeal(NP) swabs in vial transport medium  Result Value Ref Range Status   SARS Coronavirus 2 by RT PCR NEGATIVE NEGATIVE Final    Comment: (NOTE) SARS-CoV-2 target nucleic acids are NOT DETECTED.  The SARS-CoV-2 RNA is generally detectable in upper respiratory specimens  during the acute phase of infection. The lowest concentration of SARS-CoV-2 viral copies this assay can detect is 138 copies/mL. A negative result does not preclude SARS-Cov-2 infection and should not be used as the sole basis for treatment or other patient management decisions. A negative result may occur with  improper specimen collection/handling, submission of specimen other than nasopharyngeal swab, presence of viral mutation(s) within the areas targeted by this assay, and inadequate number of viral copies(<138 copies/mL). A negative result must be combined with clinical observations, patient history, and epidemiological information. The expected result is Negative.  Fact Sheet for Patients:  EntrepreneurPulse.com.au  Fact Sheet for Healthcare Providers:  IncredibleEmployment.be  This test is no t yet approved  or cleared by the [place] and  has been authorized for detection and/or diagnosis of SARS-CoV-2 by FDA under an Emergency Use Authorization (EUA). This EUA will remain  in effect (meaning this test can be used) for the duration of the COVID-19 declaration under Section 564(b)(1) of the Act, 21 U.S.C.section 360bbb-3(b)(1), unless the authorization is terminated  or revoked sooner.       Influenza A by PCR NEGATIVE NEGATIVE Final   Influenza B by PCR NEGATIVE NEGATIVE Final    Comment: (NOTE) The Xpert Xpress SARS-CoV-2/FLU/RSV plus assay is intended as an aid in the diagnosis of influenza from Nasopharyngeal swab specimens and should not be used as a sole basis for treatment. Nasal washings and aspirates are unacceptable for Xpert Xpress SARS-CoV-2/FLU/RSV testing.  Fact Sheet for Patients: EntrepreneurPulse.com.au  Fact Sheet for Healthcare Providers: IncredibleEmployment.be  This test is not yet approved or cleared by the [place] FDA and has been authorized for detection  and/or diagnosis of SARS-CoV-2 by FDA under an Emergency Use Authorization (EUA). This EUA will remain in effect (meaning this test can be used) for the duration of the COVID-19 declaration under Section 564(b)(1) of the Act, 21 U.S.C. section 360bbb-3(b)(1), unless the authorization is terminated or revoked.  Performed at [facility], [place] [address], [place], [place] [address]      Time coordinating discharge: Over 30 minutes  SIGNED:   Dannie Woolen J [place] DO  [facility] 10/30/2020, 7:05 PM

## 2020-10-30 NOTE — Chronic Care Management (AMB) (Signed)
  Care Management    Consult Note  10/30/2020 Name: [name] MRN: [id number] DOB: January 31, 1956  Care management team received notification of patient's recent inpatient hospitalization related to HTN and DM .Based on review of health record, [name] is currently active in the embedded care coordination program.  Review of patient status, including review of consultants reports, relevant laboratory and other test results, and collaboration with appropriate care team members and the patient's provider was performed as part of comprehensive patient evaluation and provision of chronic care management services.    SW reviewed patient chart to note he has left AMA. SW collaboration with patients primary care provider and [place] to advise of patients decision to sign himself out of the hospital.   Plan:  Noted scheduled call with [place] on 8.16.22. SW will plan to follow up with the patient over the next 14 days.  [name], BSW, CDP Social Worker, Certified Dementia Practitioner [place] [facility] [phone]

## 2020-10-31 ENCOUNTER — Other Ambulatory Visit: Payer: Self-pay | Admitting: Nurse Practitioner

## 2020-10-31 ENCOUNTER — Telehealth: Payer: Self-pay | Admitting: Nurse Practitioner

## 2020-10-31 ENCOUNTER — Ambulatory Visit: Payer: Self-pay

## 2020-10-31 ENCOUNTER — Telehealth: Payer: Medicare Other

## 2020-10-31 ENCOUNTER — Telehealth: Payer: Self-pay

## 2020-10-31 ENCOUNTER — Ambulatory Visit: Payer: Medicare Other | Admitting: Nurse Practitioner

## 2020-10-31 DIAGNOSIS — I10 Essential (primary) hypertension: Secondary | ICD-10-CM

## 2020-10-31 DIAGNOSIS — F32A Depression, unspecified: Secondary | ICD-10-CM

## 2020-10-31 DIAGNOSIS — E1169 Type 2 diabetes mellitus with other specified complication: Secondary | ICD-10-CM

## 2020-10-31 DIAGNOSIS — E1121 Type 2 diabetes mellitus with diabetic nephropathy: Secondary | ICD-10-CM

## 2020-10-31 DIAGNOSIS — F329 Major depressive disorder, single episode, unspecified: Secondary | ICD-10-CM

## 2020-10-31 DIAGNOSIS — E785 Hyperlipidemia, unspecified: Secondary | ICD-10-CM

## 2020-10-31 DIAGNOSIS — N1831 Chronic kidney disease, stage 3a: Secondary | ICD-10-CM

## 2020-10-31 NOTE — Chronic Care Management (AMB) (Addendum)
Chronic Care Management    Social Work Note  10/31/2020 Name: [name] MRN: [id number] DOB: 1955/09/02  [name] is a 66 y.o. year old male who is a primary care patient of [name], [place]. The CCM team was consulted to assist the patient with chronic disease management and/or care coordination needs related to:  Mental Health Concerns .   Engaged with patient by telephone for follow up visit in response to provider referral for social work chronic care management and care coordination services.   Consent to Services:  The patient was given information about Chronic Care Management services, agreed to services, and gave verbal consent prior to initiation of services.  Please see initial visit note for detailed documentation.   Patient agreed to services and consent obtained.   Assessment: Review of patient past medical history, allergies, medications, and health status, including review of relevant consultants reports was performed today as part of a comprehensive evaluation and provision of chronic care management and care coordination services.     SDOH (Social Determinants of Health) assessments and interventions performed:    Advanced Directives Status: Not addressed in this encounter.  CCM Care Plan  No Known Allergies  Outpatient Encounter Medications as of 10/31/2020  Medication Sig Note   acetaminophen (TYLENOL) 325 MG tablet Take 2 tablets (650 mg total) by mouth every 6 (six) hours as needed for mild pain (or Fever >/= 101). 10/03/2020: As needed   aspirin EC 81 MG tablet Take 81 mg by mouth daily with breakfast.     atorvastatin (LIPITOR) 40 MG tablet 1 tablet by mouth Monday - Friday    clotrimazole-betamethasone (LOTRISONE) cream Apply 1 application topically 2 (two) times daily.    diclofenac Sodium (VOLTAREN) 1 % GEL Apply 4 g topically 4 (four) times daily.    metoprolol tartrate (LOPRESSOR) 50 MG tablet Take 1 tablet (50 mg total) by mouth 2 (two) times  daily.    MYRBETRIQ 25 MG TB24 tablet TAKE 1 TABLET BY MOUTH EVERY DAY (Patient taking differently: Take 25 mg by mouth daily.)    nystatin (NYSTATIN) powder Apply 1 application topically 3 (three) times daily.    olmesartan-hydrochlorothiazide (BENICAR HCT) 40-25 MG tablet Take 1 tablet by mouth daily.    sildenafil (REVATIO) 20 MG tablet Take 20-100 mg by mouth daily as needed for erectile dysfunction.    Tetrahydrozoline HCl (VISINE OP) Place 1 drop into both eyes daily as needed (itching/irritation).    TRADJENTA 5 MG TABS tablet TAKE 1 TABLET BY MOUTH EVERY DAY    traMADol (ULTRAM) 50 MG tablet Take 1 tablet (50 mg total) by mouth every 6 (six) hours as needed for severe pain.    traZODone (DESYREL) 50 MG tablet TAKE 1 TABLET (50 MG TOTAL) BY MOUTH AT BEDTIME AS NEEDED FOR SLEEP.    No facility-administered encounter medications on file as of 10/31/2020.    Patient Active Problem List   Diagnosis Date Noted   Hypertensive urgency, malignant 10/25/2020   Acute urinary retention 10/25/2020   Adjustment disorder with mixed anxiety and depressed mood    Acute renal failure superimposed on stage 3a chronic kidney disease ([place]) 09/15/2020   Ataxia 08/01/2020   Dizziness 07/31/2020   Chest pain 07/31/2020   Hip pain 07/31/2020   AKI (acute kidney injury) ([place]) 02/11/2020   Hydroureteronephrosis 02/10/2020   Nausea and vomiting 02/10/2020   History of COVID-19 02/10/2020   Pneumonia due to COVID-19 virus 09/29/2019   Hyponatremia 09/28/2019  Sinus tachycardia 09/28/2019   Insomnia 08/26/2018   Nephropathy 03/26/2018   Urinary urgency 03/26/2018   Type 2 diabetes mellitus with diabetic nephropathy, without long-term current use of insulin ([place]) 03/26/2018   Essential hypertension 03/26/2018   Chronic kidney disease (CKD), stage III (moderate) ([place]) 03/26/2018   Conductive hearing loss, bilateral 11/07/2017   Uncontrolled type 2 diabetes mellitus with hyperglycemia ([place]) 07/31/2016    Dyslipidemia associated with type 2 diabetes mellitus ([place]) 05/29/2016   Microcytic anemia 02/17/2012   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   Benign hypertension 02/17/2012   Depressive disorder 02/17/2012    Conditions to be addressed/monitored: HTN, DMII, and CKD Stage III ; Mental Health Concerns   Care Plan : Social Work [place]  Updates made by [name] since 10/31/2020 12:00 AM     Problem: Home and Family Safety (Wellness)      Goal: Patient Safety Concerns   Start Date: 10/20/2020  Expected End Date: 11/19/2020  Priority: High  Note:   Current Barriers:  Chronic disease management support and education needs related to HTN, DM, and CKD Stage III   8 ED visits during the month of July  Majority of which he left AMA - One ED visit he expressed depression due to fight with his girlfriend stating "he cannot be happy without her" per provider documentation the patient was screened and provided with outpatient [place] resources Unable to contact the patient - contact number is no longer working Patient was a no call no show for office visit with primary provider on 8.4.22  Social Worker Clinical Goal(s):  SW will contact [employer] department to request a welfare check based on above barriers SW will mail patient a letter requesting he contact a member of the care management team SW will collaborate with patients primary care team as needed to discuss concerns for patient safety  SW Interventions:  Inter-disciplinary care team collaboration (see longitudinal plan of care) Collaboration with [name], [place] regarding development and update of comprehensive plan of care as evidenced by provider attestation and co-signature Received communication from patients provider [name], [place] who reports a CMA contacted the patient to complete a TOC Call and the patient indicated he did not want to live. Provider requests SW contact mobile crisis. Unfortunately, the provider  is not sure of the patients location  Successful outbound call placed to the patient  to assess for acute needs Patient is tearful on the phone stating "there ain't no help for me. All I live for is heartache"  Patient stated he would like to kill himself because his girlfriend will not speak to him; states he spoke with her yesterday but she has not yet called him today Requested location of the patient, patient refused to tell SW stating "I am [place] and I won't tell you where because you will call the police on me" Assessed for patient access to weapons - patient stated "all of my weapons are at the pawn shop" Patient stated he has thought about walking into traffic in order to hurt himself Attempted to provide resources for mental crisis- patient refused stating "I know who to call" Collaboration with patients provider and  [place] while on call with the patient to advise patient is suicidal and refusing to tell SW location SW requested RN Care Manager contact the patients apartment [place] at [phone] to determine if the patient is in the home - this call was unsuccessful SW collaboration  with RN Care Manager who will contact Police Department due to concern with patients suicidal ideation SW continued to engage with the patient while waiting on police to arrive to patients home Requested the patient tell SW his location several times - patient continued to refuse stating "you are just trying to know this so you can call the police on me" Unfortunately, when police arrived to the patients home, the patient was not there The patient continued to be tearful and discuss his inability to live without his girlfriend Patient became silent on the phone refusing to answer SW SW stayed on the phone for 10 minutes stating "Mr. Haagen are you there" without response so SW ended the call Collaboration with Nathalie who did follow up and contact the  patient (see [place]) Received notification patient agreeable to be seen in clinic at 4:15 pm by primary provider Discussion among SW, [place], and Primary Provider surrounding next steps for the patient and possibility of involuntarily committing the patient SW collaboration with colleagues to obtain document for IVC and discuss process with notary Collaboration with [name] [name] by phone to discuss steps for IVC; provider has indicated at this time she prefers to contact mobile crisis as she feels the patient may not attend appointment and she would like them to contact the patient directly Collaboration with RN Care Manager and Clinical Supervisor to advise of patients providers decision to contact Mobile Crisis at this time SW is available to assist with IVC process as needed Communication received from patients provider [name], [place] who indicates she has spoken with Mobile Crisis and patient refused to engage. Mrs. Moore indicates [employer] has advised that if the patient does come into the office they may call 911 and ask for a CIP officer or ask if the behavioral health team is available. Otherwise IVC is the alternative Determined Mrs. Laurance Flatten would like SW to begin IVC paperwork in the event it is needed Initiation of IVC paperwork, provider to complete if needed  Patient Goals/Self-Care Activities patient will:   -  Attend provider appointment at 4:15 on 8.16.22  Follow Up Plan:  SW will continue to follow       Follow Up Plan:  SW will continue to follow      [name], BSW, CDP Social Worker, Certified Dementia Practitioner [place] [facility] [place] [facility] [phone]

## 2020-10-31 NOTE — Telephone Encounter (Signed)
Called Mobile crisis (spoke with Jenny Reichmann) at [phone] due to the patient verbalizing wanting to harm himself. She will contact him to see if he is willing to be evaluated at [facility].  She will update me once she has made contact.

## 2020-10-31 NOTE — Chronic Care Management (AMB) (Signed)
Chronic Care Management   CCM RN Visit Note  10/31/2020 Name: ATHOL POLLAK MRN: [id number] DOB: 01-19-1956  Subjective: ANSELMO STEGER is a 65 y.o. year old male who is a primary care patient of [name], [place]. The care management team was consulted for assistance with disease management and care coordination needs.    Engaged with patient by telephone for follow up visit in response to provider referral for case management and/or care coordination services.   Consent to Services:  The patient was given information about Chronic Care Management services, agreed to services, and gave verbal consent prior to initiation of services.  Please see initial visit note for detailed documentation.   Patient agreed to services and verbal consent obtained.   Assessment: Review of patient past medical history, allergies, medications, health status, including review of consultants reports, laboratory and other test data, was performed as part of comprehensive evaluation and provision of chronic care management services.   SDOH (Social Determinants of Health) assessments and interventions performed:    CCM Care Plan  No Known Allergies  Outpatient Encounter Medications as of 10/31/2020  Medication Sig Note   acetaminophen (TYLENOL) 325 MG tablet Take 2 tablets (650 mg total) by mouth every 6 (six) hours as needed for mild pain (or Fever >/= 101). 10/03/2020: As needed   aspirin EC 81 MG tablet Take 81 mg by mouth daily with breakfast.     atorvastatin (LIPITOR) 40 MG tablet 1 tablet by mouth Monday - Friday    clotrimazole-betamethasone (LOTRISONE) cream Apply 1 application topically 2 (two) times daily.    diclofenac Sodium (VOLTAREN) 1 % GEL Apply 4 g topically 4 (four) times daily.    metoprolol tartrate (LOPRESSOR) 50 MG tablet Take 1 tablet (50 mg total) by mouth 2 (two) times daily.    MYRBETRIQ 25 MG TB24 tablet TAKE 1 TABLET BY MOUTH EVERY DAY (Patient taking differently: Take 25 mg by mouth  daily.)    nystatin (NYSTATIN) powder Apply 1 application topically 3 (three) times daily.    olmesartan-hydrochlorothiazide (BENICAR HCT) 40-25 MG tablet Take 1 tablet by mouth daily.    sildenafil (REVATIO) 20 MG tablet Take 20-100 mg by mouth daily as needed for erectile dysfunction.    Tetrahydrozoline HCl (VISINE OP) Place 1 drop into both eyes daily as needed (itching/irritation).    TRADJENTA 5 MG TABS tablet TAKE 1 TABLET BY MOUTH EVERY DAY    traMADol (ULTRAM) 50 MG tablet Take 1 tablet (50 mg total) by mouth every 6 (six) hours as needed for severe pain.    traZODone (DESYREL) 50 MG tablet TAKE 1 TABLET (50 MG TOTAL) BY MOUTH AT BEDTIME AS NEEDED FOR SLEEP.    [DISCONTINUED] linagliptin (TRADJENTA) 5 MG TABS tablet Take 5 mg by mouth daily.    No facility-administered encounter medications on file as of 10/31/2020.    Patient Active Problem List   Diagnosis Date Noted   Hypertensive urgency, malignant 10/25/2020   Acute urinary retention 10/25/2020   Adjustment disorder with mixed anxiety and depressed mood    Acute renal failure superimposed on stage 3a chronic kidney disease ([place]) 09/15/2020   Ataxia 08/01/2020   Dizziness 07/31/2020   Chest pain 07/31/2020   Hip pain 07/31/2020   AKI (acute kidney injury) ([place]) 02/11/2020   Hydroureteronephrosis 02/10/2020   Nausea and vomiting 02/10/2020   History of COVID-19 02/10/2020   Pneumonia due to COVID-19 virus 09/29/2019   Hyponatremia 09/28/2019   Sinus tachycardia 09/28/2019  Insomnia 08/26/2018   Nephropathy 03/26/2018   Urinary urgency 03/26/2018   Type 2 diabetes mellitus with diabetic nephropathy, without long-term current use of insulin ([place]) 03/26/2018   Essential hypertension 03/26/2018   Chronic kidney disease (CKD), stage III (moderate) ([place]) 03/26/2018   Conductive hearing loss, bilateral 11/07/2017   Uncontrolled type 2 diabetes mellitus with hyperglycemia ([place]) 07/31/2016   Dyslipidemia associated with type  2 diabetes mellitus ([place]) 05/29/2016   Microcytic anemia 02/17/2012   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   Benign hypertension 02/17/2012   Depressive disorder 02/17/2012    Conditions to be addressed/monitored: CKD III, DM II, HTN, HLD  Care Plan : Depression (Adult)  Updates made by Lynne Logan, RN since 10/31/2020 12:00 AM     Problem: Harm or Injury (Depression)   Priority: High     Goal: Harm or Injury Prevented   Start Date: 10/31/2020  Expected End Date: 12/01/2020  This Visit's Progress: On track  Priority: High  Note:   Current Barriers:  Ineffective Self Health Maintenance in a patient with CKD III, DM II, HTN, HLD Does not attend all scheduled provider appointments Lacks social connections Does not maintain contact with provider office Does not contact provider office for questions/concerns Clinical Goal(s):  Collaboration with [name], [place] regarding development and update of comprehensive plan of care as evidenced by provider attestation and co-signature Inter-disciplinary care team collaboration (see longitudinal plan of care) patient will work with care management team to address care coordination and chronic disease management needs related to Disease Management Educational Needs Care Coordination Medication Management and Education Medication Reconciliation Psychosocial Support   Interventions:  Evaluation of current treatment plan related to CKD III, DM II, HTN, HLD, self-management and patient's adherence to plan as established by provider. Collaboration with [name], [name] regarding development and update of comprehensive plan of care as evidenced by provider attestation       and co-signature Inter-disciplinary care team collaboration (see longitudinal plan of care) Received message from PCP office staff advising Mr. Zurcher was contacted for [facility] call and has expressed having suicidal ideation Collaborated with PCP and embedded BSW regarding  patient's threats to commit self harm, discussed [name] BSW will speak with the patient via telephone This RN CM stayed in contact with Tillie Rung during her call with Mr. Fasciano to determine he continues to be very distraught and is stating he has thought about harming himself in the way of stepping out into traffic Determined Mr. Gamboa feelings were triggered by him not receiving a call from his "girlfriend" this morning which he states has led him to feel like harming himself Attempted an unsuccessful call to apartment [place] [phone] to request a welfare check Called 911 to request a home visit to offer support and help determine if Mr. Zerbe will cooperate with going to the [facility] and or [facility] for evaluation and treatment, spoke with Adam with [facility], agent [phone] Determined Mr. Girgis refuses to state his current location but advised Tillie Rung SW he is not at his home and he will not seek medical attention Received a call from officer Cindee Lame [phone] with the [facility] department who stated he arrived at the patient's apartment and Mr. Lafon does not appear to be at his home  Determined this RN CM will attempt to speak with Mr. Bodkins to offer support and discuss having him seek medical attention  Completed successful outbound call to patient, he would not provide his  location and stated "I am not at my home" Determined Mr. Mcclees continues to feel suicidal and distraught due to feeling he must speak with his "best friend Lisabeth Pick" Attempted an unsuccessful call to friend Lisabeth Pick per patient's request with him on the line with no answer and or voice mail box Discussed having Mr. Justin Mend agree to a Passenger transport manager not to harm himself, he declined stating, "I can't promise that".  Discussed the benefits of having Mr. Web go to the ED for further evaluation and treatment, he declined, stating, "they will want to keep me and I don't want to stay in the  hospital".  Discussed having patient come into the office for a PCP office visit for evaluation and treatment, patient agreed and an appointment was scheduled for this afternoon at 4:45 PM, the patient was advised to arrive at 4:15 PM, he agreed Assessed for transportation needs, patient states, "I have someone who can bring me".  Further collaboration with PCP and embedded BSW regarding consideration of provider initiated IVC Determined PCP will outreach to the Mobile Crisis Unit and ask for further assistance  Discussed plans with patient for ongoing care management follow up and provided patient with direct contact information for care management team Self Care Activities:  Patient verbalizes understanding of plan to keep scheduled PCP visit with [name] [name] today, 10/31/20 '@4'$ :31 PM arriving at 4:15 PM  Patient Goals: - keep MD follow up appointment with PCP scheduled for today 10/31/20 '@4'$ :15PM  Follow Up Plan: Telephone follow up appointment with care management team member scheduled for: 11/01/20     Plan:Telephone follow up appointment with care management team member scheduled for:  11/01/20  Barb Merino, RN, BSN, CCM Care Management Coordinator [place] [facility]  Direct Phone: [phone]

## 2020-10-31 NOTE — Patient Instructions (Signed)
Goals Addressed      Harm or injury prevented   On track    Timeframe:  Short-Term Goal Priority:  High Start Date:  10/31/20                           Expected End Date:  12/01/20       Next Scheduled follow up: 11/01/20      Patient Goals: - keep MD follow up appointment with PCP scheduled for today 10/31/20 '@4'$ :15PM

## 2020-10-31 NOTE — Telephone Encounter (Signed)
Transition Care Management Follow-up Telephone Call Date of discharge and from where: [name] and 10/30/20 How have you been since you were released from the hospital? Pt stated he is not doing well and wish he could die today Any questions or concerns? Yes patient stated he is not doing good he wish he could die and go to hell today. He stated nobody can help him he dont know what doctors are good for and he declined to schedule an appt with [place] and stated he doesn't know what we could do for him. He said if he could get help he would be a new man etc. I informed provider [name] DNP,[name]-BC and she had notified the social worker [name] so she can reach out to him and also send the mobile crisis unit out to the pt home. YL,RMA  Items Reviewed: Did the pt receive and understand the discharge instructions provided?  N/a Medications obtained and verified?  N/a Other?  N/A Any new allergies since your discharge?  N/A Dietary orders reviewed? No Do you have support at home?  N/A  Home Care and Equipment/Supplies: Were home health services ordered? not applicable If so, what is the name of the agency? N/A  Has the agency set up a time to come to the patient's home? not applicable Were any new equipment or medical supplies ordered?  No What is the name of the medical supply agency? N/A Were you able to get the supplies/equipment? not applicable Do you have any questions related to the use of the equipment or supplies? No  Functional Questionnaire: (I = Independent and D = Dependent) ADLs: N/A  Bathing/Dressing- N/A  Meal Prep- N/A  Eating- N/A  Maintaining continence- N/A  Transferring/Ambulation- N/A  Managing Meds- N/A  Follow up appointments reviewed:  PCP Hospital f/u appt confirmed?  PT DELCINED TO SCHEDULE APPT  Scheduled to see N/A on N/A @ N/A. [facility] f/u appt confirmed?  N/A  Scheduled to see N/A on N/A @ N/A. Are transportation arrangements  needed?  N/A If their condition worsens, is the pt aware to call PCP or go to the Emergency Dept.? No Was the patient provided with contact information for the PCP's office or ED? No Was to pt encouraged to call back with questions or concerns? No

## 2020-10-31 NOTE — Progress Notes (Incomplete)
10/31/20 12:46 PM   [name] Jan 11, 1956 [id number]  Referring provider:  [name], [address] [place] [place],  [place] [address] No chief complaint on file.    HPI: [name] is a 65 y.o.male with prostate cancer who presents for a second opinion on his prostate cancer.   He has been seen at [facility] by Dr. Michaelle Birks. He was diagnosed with malignant neoplasm of prostate  with a Gleason of 6. During his last visit in 01/2020 he was currently in the favorable intermediate risk category given his PSA greater that 10 and Gleason grade group 1. [name] was reluctant to proceed with treatment.   He was seen in the ED on 10/25/2020 for back pain.  He felt that his prostate cancer may have become more advanced causing him to have recurrent back pain.  He endorsed ongoing lower back pain for the past several months as well as having sensation of dizziness and lightheadedness for the same duration.  He endorsed worsening back pain this morning, as well as inability to urinate. He also stated when urinating he has noticed blood. He received a ultrasound of scrotum which showed small bilateral hydroceles. CT abdomen and pelvis with contrast showed Stable moderate prostatic enlargement, stable right renal cortical atrophy is noted with associated moderate hydroureteronephrosis which extends to surgical site midportion of right ureter. Urinary bladder is decompressed secondary to Foley catheter. Also showed small bilateral fat containing inguinal hernias.    PMH: Past Medical History:  Diagnosis Date   Acute kidney injury superimposed on chronic kidney disease ([place]) 02/10/2020   Acute on chronic kidney failure ([place]) 09/28/2019   Closed nondisplaced spiral fracture of shaft of right tibia    Complicated urinary tract infection 09/06/2016   Depression    Diabetes mellitus without complication (HCC)    GERD (gastroesophageal reflux disease)    Gout    right foot    Hemorrhoids    Hypertension    Hypertension    Macular degeneration    Microcytic anemia    Prostate cancer ([place]) 08/2009   Rectal bleeding 07/31/2016   Tubular adenoma     Surgical History: Past Surgical History:  Procedure Laterality Date   CYSTOSCOPY WITH RETROGRADE PYELOGRAM, URETEROSCOPY AND STENT PLACEMENT Right 07/31/2016   Procedure: CYSTOSCOPY WITH RIGHT  RETROGRADE PYELOGRAM, URETEROSCOPY;  Surgeon: [name], MD;  Location: [facility];  Service: Urology;  Laterality: Right;   INGUINAL HERNIA REPAIR     Left ankle joint fusion  1981   PROSTATE BIOPSY     x 2   URETERAL REIMPLANTION  07/31/2016   Procedure: URETERAL REIMPLANT, right boari flap, right psoas hitch;  Surgeon: [name], MD;  Location: [facility];  Service: Urology;;    Home Medications:  Allergies as of 11/01/2020   No Known Allergies      Medication List        Accurate as of October 31, 2020 12:46 PM. If you have any questions, ask your nurse or doctor.          acetaminophen 325 MG tablet Commonly known as: TYLENOL Take 2 tablets (650 mg total) by mouth every 6 (six) hours as needed for mild pain (or Fever >/= 101).   aspirin EC 81 MG tablet Take 81 mg by mouth daily with breakfast.   atorvastatin 40 MG tablet Commonly known as: LIPITOR 1 tablet by mouth Monday - Friday   clotrimazole-betamethasone cream Commonly known as: LOTRISONE  Apply 1 application topically 2 (two) times daily.   diclofenac Sodium 1 % Gel Commonly known as: Voltaren Apply 4 g topically 4 (four) times daily.   metoprolol tartrate 50 MG tablet Commonly known as: LOPRESSOR Take 1 tablet (50 mg total) by mouth 2 (two) times daily.   Myrbetriq 25 MG Tb24 tablet Generic drug: mirabegron ER TAKE 1 TABLET BY MOUTH EVERY DAY What changed: how much to take   nystatin powder Commonly known as: nystatin Apply 1 application topically 3 (three) times daily.   olmesartan-hydrochlorothiazide 40-25 MG  tablet Commonly known as: BENICAR HCT Take 1 tablet by mouth daily.   sildenafil 20 MG tablet Commonly known as: REVATIO Take 20-100 mg by mouth daily as needed for erectile dysfunction.   Tradjenta 5 MG Tabs tablet Generic drug: linagliptin Take 5 mg by mouth daily.   traMADol 50 MG tablet Commonly known as: ULTRAM Take 1 tablet (50 mg total) by mouth every 6 (six) hours as needed for severe pain.   traZODone 50 MG tablet Commonly known as: DESYREL TAKE 1 TABLET (50 MG TOTAL) BY MOUTH AT BEDTIME AS NEEDED FOR SLEEP.   VISINE OP Place 1 drop into both eyes daily as needed (itching/irritation).        Allergies: No Known Allergies  Family History: Family History  Problem Relation Age of Onset   Lung cancer Mother        Deceased   Throat cancer Brother    Pancreatic cancer Father    Heart disease Father        Deceased   Lung cancer Maternal Uncle        nephew   Diabetes Other     Social History:  reports that he has never smoked. He has never used smokeless tobacco. He reports current alcohol use. He reports that he does not use drugs.   Physical Exam: There were no vitals taken for this visit.  Constitutional:  Alert and oriented, No acute distress. HEENT: [place] AT, moist mucus membranes.  Trachea midline, no masses. Cardiovascular: No clubbing, cyanosis, or edema. Respiratory: Normal respiratory effort, no increased work of breathing. Skin: No rashes, bruises or suspicious lesions. Neurologic: Grossly intact, no focal deficits, moving all 4 extremities. Psychiatric: Normal mood and affect.  Laboratory Data:  Lab Results  Component Value Date   CREATININE 2.10 (H) 10/30/2020    Lab Results  Component Value Date   PSA 4.60 (H) 08/18/2008   PSA 4.29 (H) 06/18/2007     Lab Results  Component Value Date   HGBA1C 8.1 (H) 07/17/2020    Urinalysis   Pertinent Imaging: CLINICAL DATA:  Left greater than right scrotal pain   EXAM: SCROTAL  ULTRASOUND   DOPPLER ULTRASOUND OF THE TESTICLES   TECHNIQUE: Complete ultrasound examination of the testicles, epididymis, and other scrotal structures was performed. Color and spectral Doppler ultrasound were also utilized to evaluate blood flow to the testicles.   COMPARISON:  Ultrasound 09/15/2020   FINDINGS: Right testicle   Measurements: 3.6 x 2.5 x 2.7 cm. No mass or microlithiasis visualized.   Left testicle   Measurements: 4.2 x 2.3 x 3 cm. No mass or microlithiasis visualized.   Right epididymis:  Normal in size and appearance.   Left epididymis:  Normal in size and appearance.   Hydrocele: Small bilateral hydroceles. Particulate debris within the hydroceles.   Varicocele:  None visualized.   Pulsed Doppler interrogation of both testes demonstrates normal low resistance arterial and venous waveforms bilaterally.  IMPRESSION: 1. Small bilateral hydroceles. 2. Negative for testicular torsion     Electronically Signed   By: Donavan Foil M.D.   On: 10/25/2020 17:55   CLINICAL DATA:  Acute generalized abdominal pain. History of prostate cancer.   EXAM: CT ABDOMEN AND PELVIS WITH CONTRAST   TECHNIQUE: Multidetector CT imaging of the abdomen and pelvis was performed using the standard protocol following bolus administration of intravenous contrast.   CONTRAST:  28m OMNIPAQUE IOHEXOL 350 MG/ML SOLN   COMPARISON:  February 10, 2020.   FINDINGS: Lower chest: No acute abnormality.   Hepatobiliary: No focal liver abnormality is seen. No gallstones, gallbladder wall thickening, or biliary dilatation.   Pancreas: Unremarkable. No pancreatic ductal dilatation or surrounding inflammatory changes.   Spleen: Normal in size without focal abnormality.   Adrenals/Urinary Tract: Adrenal glands appear normal. Stable left renal cysts are noted. Stable right renal cortical atrophy is noted with moderate hydroureteronephrosis which extends to surgical  site in midportion of right ureter. Urinary bladder is decompressed secondary to Foley catheter. No renal or ureteral calculi are noted.   Stomach/Bowel: Stomach is within normal limits. Appendix appears normal. No evidence of bowel wall thickening, distention, or inflammatory changes.   Vascular/Lymphatic: Aortic atherosclerosis. No enlarged abdominal or pelvic lymph nodes.   Reproductive: Stable moderate prostatic enlargement is noted.   Other: Small bilateral fat containing inguinal hernias are noted. No ascites is noted.   Musculoskeletal: No acute or significant osseous findings.   IMPRESSION: Stable moderate prostatic enlargement.   Stable right renal cortical atrophy is noted with associated moderate hydroureteronephrosis which extends to surgical site midportion of right ureter. Urinary bladder is decompressed secondary to Foley catheter.   Small bilateral fat containing inguinal hernias.   Aortic Atherosclerosis (ICD10-I70.0).     Electronically Signed   By: JMarijo ConceptionM.D.   On: 10/25/2020 15:40    Assessment & Plan:     No follow-ups on file.  I,Kailey Littlejohn,acting as a sEducation administratorfor AHollice Espy MD.,have documented all relevant documentation on the behalf of AHollice Espy MD,as directed by  AHollice Espy MD while in the presence of AHollice Espy [place] [place] [phone]

## 2020-10-31 NOTE — Patient Instructions (Signed)
Social Worker Visit Information  Goals we discussed today:   Goals Addressed             This Visit's Progress    Patient Safety Concerns       Timeframe:  Short-Term Goal Priority:  High Start Date:  8.5.22                           Expected End Date: 9.4.22                       Patient Goals/Self-Care Activities patient will:   - Attend provider appointment at 4:15 on 8.16.22          Materials Provided: No: Patient declined  Follow Up Plan:  SW will continue to follow   [name], BSW, CDP Social Worker, Certified Dementia Practitioner [place] [facility] [place] [facility] [phone]

## 2020-10-31 NOTE — Telephone Encounter (Signed)
Spoke with Lambert Keto from [facility] and she has spoken with the patient who declines assistance from [employer]. He did not show up for his appt today that was set for 445p. SW and Case Manager also made aware in the event they speak with the patient.

## 2020-11-01 ENCOUNTER — Telehealth: Payer: Medicare Other

## 2020-11-01 ENCOUNTER — Telehealth: Payer: Self-pay

## 2020-11-01 ENCOUNTER — Ambulatory Visit: Payer: Self-pay | Admitting: Urology

## 2020-11-01 NOTE — Telephone Encounter (Signed)
  Care Management   Follow Up Note   11/01/2020 Name: [name] MRN: [id number] DOB: November 22, 1955   Referred by: [name], [name] Reason for referral : Chronic Care Management (RN CM Follow up call)   An unsuccessful telephone outreach was attempted today. The patient was referred to the case management team for assistance with care management and care coordination.   Follow Up Plan: Telephone follow up appointment with care management team member scheduled for: 11/06/20  Barb Merino, RN, BSN, CCM Care Management Coordinator [place] [facility]  Direct Phone: [phone]

## 2020-11-03 DIAGNOSIS — N200 Calculus of kidney: Secondary | ICD-10-CM | POA: Diagnosis not present

## 2020-11-03 DIAGNOSIS — N138 Other obstructive and reflux uropathy: Secondary | ICD-10-CM | POA: Diagnosis not present

## 2020-11-03 DIAGNOSIS — S3710XD Unspecified injury of ureter, subsequent encounter: Secondary | ICD-10-CM | POA: Diagnosis not present

## 2020-11-03 DIAGNOSIS — R339 Retention of urine, unspecified: Secondary | ICD-10-CM | POA: Diagnosis not present

## 2020-11-05 ENCOUNTER — Other Ambulatory Visit: Payer: Self-pay | Admitting: Nurse Practitioner

## 2020-11-05 DIAGNOSIS — R350 Frequency of micturition: Secondary | ICD-10-CM

## 2020-11-05 DIAGNOSIS — R32 Unspecified urinary incontinence: Secondary | ICD-10-CM

## 2020-11-06 ENCOUNTER — Ambulatory Visit: Payer: Medicare Other

## 2020-11-06 ENCOUNTER — Telehealth: Payer: Self-pay | Admitting: Nurse Practitioner

## 2020-11-06 DIAGNOSIS — N1831 Chronic kidney disease, stage 3a: Secondary | ICD-10-CM

## 2020-11-06 DIAGNOSIS — I10 Essential (primary) hypertension: Secondary | ICD-10-CM

## 2020-11-06 DIAGNOSIS — E1121 Type 2 diabetes mellitus with diabetic nephropathy: Secondary | ICD-10-CM

## 2020-11-06 NOTE — Telephone Encounter (Signed)
Tried calling patient to schedule Medicare Annual Wellness Visit (AWV) either virtually or in office.    No answer  Last AWV 08/26/19  please schedule at anytime with [facility]    This should be a 45 minute visit.
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
## 2020-11-06 NOTE — Patient Instructions (Signed)
Social Worker Visit Information  Goals we discussed today:   Goals Addressed             This Visit's Progress    Patient Safety Concerns       Timeframe:  Short-Term Goal Priority:  High Start Date:  8.5.22                           Expected End Date: 9.4.22                       Patient Goals/Self-Care Activities patient will:   - Contact his provider to schedule an appointment as needed -Contact SW as needed prior to next scheduled call      Quality of Life Maintained       Timeframe:  Long-Range Goal Priority:  Medium Start Date:   5.25.22                                          Next planned outreach: 9.22.22  Patient Goals/Self-Care Activities patient will:   - Contact his provider to schedule an appointment as needed -Contact SW as needed prior to next scheduled call         Follow Up Plan: SW will follow up with patient by phone over the next month   [name], BSW, CDP Social Worker, Certified Dementia Practitioner [place] [facility] [place] [facility] [phone]

## 2020-11-11 ENCOUNTER — Encounter (HOSPITAL_COMMUNITY): Payer: Self-pay

## 2020-11-11 ENCOUNTER — Emergency Department (HOSPITAL_COMMUNITY): Payer: Medicare Other

## 2020-11-11 ENCOUNTER — Observation Stay (HOSPITAL_COMMUNITY)
Admission: EM | Admit: 2020-11-11 | Discharge: 2020-11-11 | Disposition: A | Discharge status: Home / Self Care | Payer: Medicare Other | Attending: Emergency Medicine | Admitting: Emergency Medicine

## 2020-11-11 DIAGNOSIS — Z79899 Other long term (current) drug therapy: Secondary | ICD-10-CM | POA: Insufficient documentation

## 2020-11-11 DIAGNOSIS — Z8616 Personal history of COVID-19: Secondary | ICD-10-CM | POA: Insufficient documentation

## 2020-11-11 DIAGNOSIS — R2681 Unsteadiness on feet: Secondary | ICD-10-CM | POA: Diagnosis not present

## 2020-11-11 DIAGNOSIS — E1122 Type 2 diabetes mellitus with diabetic chronic kidney disease: Secondary | ICD-10-CM | POA: Diagnosis not present

## 2020-11-11 DIAGNOSIS — W19XXXA Unspecified fall, initial encounter: Secondary | ICD-10-CM | POA: Diagnosis present

## 2020-11-11 DIAGNOSIS — Z794 Long term (current) use of insulin: Secondary | ICD-10-CM | POA: Insufficient documentation

## 2020-11-11 DIAGNOSIS — R296 Repeated falls: Secondary | ICD-10-CM

## 2020-11-11 DIAGNOSIS — M549 Dorsalgia, unspecified: Secondary | ICD-10-CM | POA: Diagnosis not present

## 2020-11-11 DIAGNOSIS — I129 Hypertensive chronic kidney disease with stage 1 through stage 4 chronic kidney disease, or unspecified chronic kidney disease: Secondary | ICD-10-CM | POA: Diagnosis not present

## 2020-11-11 DIAGNOSIS — R404 Transient alteration of awareness: Secondary | ICD-10-CM | POA: Diagnosis not present

## 2020-11-11 DIAGNOSIS — Z8546 Personal history of malignant neoplasm of prostate: Secondary | ICD-10-CM | POA: Diagnosis not present

## 2020-11-11 DIAGNOSIS — Z20822 Contact with and (suspected) exposure to covid-19: Secondary | ICD-10-CM | POA: Diagnosis not present

## 2020-11-11 DIAGNOSIS — N183 Chronic kidney disease, stage 3 unspecified: Secondary | ICD-10-CM | POA: Insufficient documentation

## 2020-11-11 DIAGNOSIS — R42 Dizziness and giddiness: Secondary | ICD-10-CM | POA: Diagnosis not present

## 2020-11-11 DIAGNOSIS — Z7982 Long term (current) use of aspirin: Secondary | ICD-10-CM | POA: Insufficient documentation

## 2020-11-11 DIAGNOSIS — R739 Hyperglycemia, unspecified: Secondary | ICD-10-CM | POA: Diagnosis not present

## 2020-11-11 DIAGNOSIS — R6889 Other general symptoms and signs: Secondary | ICD-10-CM | POA: Diagnosis not present

## 2020-11-11 DIAGNOSIS — Z743 Need for continuous supervision: Secondary | ICD-10-CM | POA: Diagnosis not present

## 2020-11-11 LAB — COMPREHENSIVE METABOLIC PANEL
ALT: 44 U/L (ref 0–44)
AST: 37 U/L (ref 15–41)
Albumin: 3.3 g/dL — ABNORMAL LOW (ref 3.5–5.0)
Alkaline Phosphatase: 112 U/L (ref 38–126)
Anion gap: 11 (ref 5–15)
BUN: 33 mg/dL — ABNORMAL HIGH (ref 8–23)
CO2: 21 mmol/L — ABNORMAL LOW (ref 22–32)
Calcium: 9.3 mg/dL (ref 8.9–10.3)
Chloride: 104 mmol/L (ref 98–111)
Creatinine, Ser: 1.82 mg/dL — ABNORMAL HIGH (ref 0.61–1.24)
GFR, Estimated: 40 mL/min — ABNORMAL LOW (ref >60)
Glucose, Bld: 276 mg/dL — ABNORMAL HIGH (ref 70–99)
Potassium: 5.2 mmol/L — ABNORMAL HIGH (ref 3.5–5.1)
Sodium: 136 mmol/L (ref 135–145)
Total Bilirubin: 1 mg/dL (ref 0.3–1.2)
Total Protein: 6.2 g/dL — ABNORMAL LOW (ref 6.5–8.1)

## 2020-11-11 LAB — CBC WITH DIFFERENTIAL/PLATELET
Abs Immature Granulocytes: 0.03 10*3/uL (ref 0.00–0.07)
Basophils Absolute: 0 10*3/uL (ref 0.0–0.1)
Basophils Relative: 1 %
Eosinophils Absolute: 0.2 10*3/uL (ref 0.0–0.5)
Eosinophils Relative: 2 %
HCT: 50.6 % (ref 39.0–52.0)
Hemoglobin: 16.9 g/dL (ref 13.0–17.0)
Immature Granulocytes: 0 %
Lymphocytes Relative: 11 %
Lymphs Abs: 0.9 10*3/uL (ref 0.7–4.0)
MCH: 29 pg (ref 26.0–34.0)
MCHC: 33.4 g/dL (ref 30.0–36.0)
MCV: 86.8 fL (ref 80.0–100.0)
Monocytes Absolute: 0.6 10*3/uL (ref 0.1–1.0)
Monocytes Relative: 7 %
Neutro Abs: 6.6 10*3/uL (ref 1.7–7.7)
Neutrophils Relative %: 79 %
Platelets: 211 10*3/uL (ref 150–400)
RBC: 5.83 MIL/uL — ABNORMAL HIGH (ref 4.22–5.81)
RDW: 13.6 % (ref 11.5–15.5)
WBC: 8.3 10*3/uL (ref 4.0–10.5)
nRBC: 0 % (ref 0.0–0.2)

## 2020-11-11 LAB — URINALYSIS, ROUTINE W REFLEX MICROSCOPIC
Bacteria, UA: NONE SEEN
Bilirubin Urine: NEGATIVE
Glucose, UA: >=500 mg/dL — AB
Hgb urine dipstick: NEGATIVE
Ketones, ur: NEGATIVE mg/dL
Leukocytes,Ua: NEGATIVE
Nitrite: NEGATIVE
Protein, ur: 30 mg/dL — AB
Specific Gravity, Urine: 1.016 (ref 1.005–1.030)
pH: 6 (ref 5.0–8.0)

## 2020-11-11 LAB — RESP PANEL BY RT-PCR (FLU A&B, COVID) ARPGX2
Influenza A by PCR: NEGATIVE
Influenza B by PCR: NEGATIVE
SARS Coronavirus 2 by RT PCR: NEGATIVE

## 2020-11-11 LAB — TROPONIN I (HIGH SENSITIVITY): Troponin I (High Sensitivity): 7 ng/L (ref <18)

## 2020-11-11 NOTE — ED Notes (Addendum)
Pt standing at beside stretcher and was ambulated to bathroom w/ stand by assistance.

## 2020-11-11 NOTE — ED Notes (Signed)
Pt set off posey alarm again and crawled out of bed and removed monitoring equipment. Pt educated to get back in bed and stay in bed for pt safety. Pt is oriented, but continues to be impulsive and not follow safety instructions despite repeated education to stay in bed. Notified MD.

## 2020-11-11 NOTE — ED Provider Notes (Signed)
[facility] EMERGENCY DEPARTMENT Provider Note   CSN: [id number] Arrival date & time: 11/11/20  1259     History Chief Complaint  Patient presents with   Dizziness    [name] is a 65 y.o. male.  He has a history of prostate cancer CKD macular degeneration diabetes.  He is complaining of on and off dizziness unsteadiness since May.  Multiple falls.  He has had MRI MRA of brain along with MRI of C-spine.  Neurology saw him in May and thought this was due to his secondary neuropathy from diabetes.  Had a other fall again today and actually fell while getting out of the stretcher in the emergency department.  Denies any injury.  No headache.  Denies any double vision, focal numbness or weakness although has some numbness in his legs and some generalized weakness in his legs.  No chest pain abdominal pain.  No urinary symptoms.  The history is provided by the patient.  Dizziness Quality:  Imbalance Severity:  Moderate Onset quality:  Gradual Duration:  4 months Timing:  Intermittent Progression:  Worsening Chronicity:  New Context: not with loss of consciousness   Relieved by:  Nothing Ineffective treatments:  None tried Associated symptoms: no chest pain, no diarrhea, no headaches, no nausea, no shortness of breath, no syncope and no vomiting   Risk factors: no new medications       Past Medical History:  Diagnosis Date   Acute kidney injury superimposed on chronic kidney disease ([place]) 02/10/2020   Acute on chronic kidney failure ([place]) 09/28/2019   Closed nondisplaced spiral fracture of shaft of right tibia    Complicated urinary tract infection 09/06/2016   Depression    Diabetes mellitus without complication (HCC)    GERD (gastroesophageal reflux disease)    Gout    right foot   Hemorrhoids    Hypertension    Hypertension    Macular degeneration    Microcytic anemia    Prostate cancer ([place]) 08/2009   Rectal bleeding 07/31/2016   Tubular adenoma      Patient Active Problem List   Diagnosis Date Noted   Hypertensive urgency, malignant 10/25/2020   Acute urinary retention 10/25/2020   Adjustment disorder with mixed anxiety and depressed mood    Acute renal failure superimposed on stage 3a chronic kidney disease ([place]) 09/15/2020   Ataxia 08/01/2020   Dizziness 07/31/2020   Chest pain 07/31/2020   Hip pain 07/31/2020   AKI (acute kidney injury) ([place]) 02/11/2020   Hydroureteronephrosis 02/10/2020   Nausea and vomiting 02/10/2020   History of COVID-19 02/10/2020   Pneumonia due to COVID-19 virus 09/29/2019   Hyponatremia 09/28/2019   Sinus tachycardia 09/28/2019   Insomnia 08/26/2018   Nephropathy 03/26/2018   Urinary urgency 03/26/2018   Type 2 diabetes mellitus with diabetic nephropathy, without long-term current use of insulin ([place]) 03/26/2018   Essential hypertension 03/26/2018   Chronic kidney disease (CKD), stage III (moderate) ([place]) 03/26/2018   Conductive hearing loss, bilateral 11/07/2017   Uncontrolled type 2 diabetes mellitus with hyperglycemia ([place]) 07/31/2016   Dyslipidemia associated with type 2 diabetes mellitus ([place]) 05/29/2016   Microcytic anemia 02/17/2012   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   Benign hypertension 02/17/2012   Depressive disorder 02/17/2012    Past Surgical History:  Procedure Laterality Date   CYSTOSCOPY WITH RETROGRADE PYELOGRAM, URETEROSCOPY AND STENT PLACEMENT Right 07/31/2016   Procedure: CYSTOSCOPY WITH RIGHT  RETROGRADE PYELOGRAM, URETEROSCOPY;  Surgeon: [name], MD;  Location:  [facility];  Service: Urology;  Laterality: Right;   INGUINAL HERNIA REPAIR     Left ankle joint fusion  1981   PROSTATE BIOPSY     x 2   URETERAL REIMPLANTION  07/31/2016   Procedure: URETERAL REIMPLANT, right boari flap, right psoas hitch;  Surgeon: [name], MD;  Location: [facility];  Service: Urology;;       Family History  Problem Relation Age of Onset   Lung cancer Mother         Deceased   Throat cancer Brother    Pancreatic cancer Father    Heart disease Father        Deceased   Lung cancer Maternal Uncle        nephew   Diabetes Other     Social History   Tobacco Use   Smoking status: Never   Smokeless tobacco: Never  Vaping Use   Vaping Use: Never used  Substance Use Topics   Alcohol use: Yes    Comment: ocassionally   Drug use: No    Home Medications Prior to Admission medications   Medication Sig Start Date End Date Taking? Authorizing Provider  acetaminophen (TYLENOL) 325 MG tablet Take 2 tablets (650 mg total) by mouth every 6 (six) hours as needed for mild pain (or Fever >/= 101). 02/12/20   Domenic Polite, MD  aspirin EC 81 MG tablet Take 81 mg by mouth daily with breakfast.     [provider]  atorvastatin (LIPITOR) 40 MG tablet 1 tablet by mouth Monday - Friday 10/04/20   [name], [name]  clotrimazole-betamethasone (LOTRISONE) cream Apply 1 application topically 2 (two) times daily. 07/17/20   [name], [name]  diclofenac Sodium (VOLTAREN) 1 % GEL Apply 4 g topically 4 (four) times daily. 09/03/20   Palumbo, April, MD  metoprolol tartrate (LOPRESSOR) 50 MG tablet Take 1 tablet (50 mg total) by mouth 2 (two) times daily. 08/03/20   Raiford Noble Latif, DO  MYRBETRIQ 25 MG TB24 tablet TAKE 1 TABLET BY MOUTH EVERY DAY 11/06/20   [name], [name]  nystatin (NYSTATIN) powder Apply 1 application topically 3 (three) times daily. 08/26/19   [name], [name]  olmesartan-hydrochlorothiazide (BENICAR HCT) 40-25 MG tablet Take 1 tablet by mouth daily. 09/20/20   Geradine Girt, DO  sildenafil (REVATIO) 20 MG tablet Take 20-100 mg by mouth daily as needed for erectile dysfunction. 10/09/20   [provider]  Tetrahydrozoline HCl (VISINE OP) Place 1 drop into both eyes daily as needed (itching/irritation).    [provider]  TRADJENTA 5 MG TABS tablet TAKE 1 TABLET BY MOUTH EVERY DAY 10/31/20   [name], [name]  traMADol  (ULTRAM) 50 MG tablet Take 1 tablet (50 mg total) by mouth every 6 (six) hours as needed for severe pain. 08/03/20   Raiford Noble Latif, DO  traZODone (DESYREL) 50 MG tablet TAKE 1 TABLET (50 MG TOTAL) BY MOUTH AT BEDTIME AS NEEDED FOR SLEEP. 06/05/20   [name], [name]    Allergies    Patient has no known allergies.  Review of Systems   Review of Systems  Constitutional:  Negative for fever.  HENT:  Negative for sore throat.   Eyes:  Negative for pain.  Respiratory:  Negative for shortness of breath.   Cardiovascular:  Negative for chest pain and syncope.  Gastrointestinal:  Negative for abdominal pain, diarrhea, nausea and vomiting.  Genitourinary:  Negative for dysuria.  Musculoskeletal:  Positive for back pain (  chronic) and gait problem.  Skin:  Negative for rash.  Neurological:  Positive for dizziness. Negative for speech difficulty and headaches.   Physical Exam Updated Vital Signs BP (!) 156/87 (BP Location: Right Arm)   Pulse 85   Temp 97.6 F (36.4 C) (Oral)   Resp 19   Ht '5\' 6"'$  (1.676 m)   Wt 76.7 kg   SpO2 97%   BMI 27.28 kg/m   Physical Exam Vitals and nursing note reviewed.  Constitutional:      Appearance: Normal appearance. He is well-developed.  HENT:     Head: Normocephalic and atraumatic.  Eyes:     Conjunctiva/sclera: Conjunctivae normal.  Cardiovascular:     Rate and Rhythm: Normal rate and regular rhythm.     Heart sounds: No murmur heard. Pulmonary:     Effort: Pulmonary effort is normal. No respiratory distress.     Breath sounds: Normal breath sounds.  Abdominal:     Palpations: Abdomen is soft.     Tenderness: There is no abdominal tenderness. There is no guarding or rebound.  Musculoskeletal:        General: No deformity or signs of injury. Normal range of motion.     Cervical back: Neck supple.  Skin:    General: Skin is warm and dry.  Neurological:     General: No focal deficit present.     Mental Status: He is alert.     Cranial  Nerves: No cranial nerve deficit.    ED Results / Procedures / Treatments   Labs (all labs ordered are listed, but only abnormal results are displayed) Labs Reviewed  URINALYSIS, ROUTINE W REFLEX MICROSCOPIC - Abnormal; Notable for the following components:      Result Value   Glucose, UA >=500 (*)    Protein, ur 30 (*)    All other components within normal limits  CBC WITH DIFFERENTIAL/PLATELET - Abnormal; Notable for the following components:   RBC 5.83 (*)    All other components within normal limits  COMPREHENSIVE METABOLIC PANEL - Abnormal; Notable for the following components:   Potassium 5.2 (*)    CO2 21 (*)    Glucose, Bld 276 (*)    BUN 33 (*)    Creatinine, Ser 1.82 (*)    Total Protein 6.2 (*)    Albumin 3.3 (*)    GFR, Estimated 40 (*)    All other components within normal limits  RESP PANEL BY RT-PCR (FLU A&B, COVID) [id number]  TROPONIN I (HIGH SENSITIVITY)    EKG EKG Interpretation  Date/Time:  Saturday November 11 2020 15:38:35 EDT Ventricular Rate:  68 PR Interval:  127 QRS Duration: 85 QT Interval:  352 QTC Calculation: 375 R Axis:   -35 Text Interpretation: Sinus rhythm Inferior infarct, old Anterior infarct, old No significant change since prior 8/22 Confirmed by Aletta Edouard [phone]) on 11/11/2020 3:50:39 PM  Radiology DG Chest Port 1 View  Result Date: 11/11/2020 CLINICAL DATA:  Dizziness. EXAM: PORTABLE CHEST 1 VIEW COMPARISON:  Chest x-ray dated September 15, 2020. FINDINGS: The heart size and mediastinal contours are within normal limits. Normal pulmonary vascularity. Unchanged mild scarring at the peripheral left lung base. No focal consolidation, pleural effusion, or pneumothorax. No acute osseous abnormality. IMPRESSION: 1. No active disease. Electronically Signed   By: Titus Dubin M.D.   On: 11/11/2020 16:00    Procedures Procedures   Medications Ordered in ED Medications - No data to display  ED Course  I have reviewed  the triage vital  signs and the nursing notes.  Pertinent labs & imaging results that were available during my care of the patient were reviewed by me and considered in my medical decision making (see chart for details).  Clinical Course as of 11/12/20 1055  Sat Nov 11, 2020  1519 MRI brain 10/25/2020 negative for any acute findings [MB]  1712 Discussed with Dr. Curly Shores from neurology.  She reviewed notes of Dr. Leonel Ramsay and Dr. Malen Gauze along with his MRIs both in May and in August.  She did not feel there was much of a neurologic reason he needed to be admitted to the hospital.  She did say he may need PT and OT and may need rehab. [MB]  1948 Patient is climbed out of bed 3 times here since arrival.  He has had multiple redirections but continues to fall down.  Do not feel it safe for discharge.  Discussed with [facility] hospitalist Dr. Cyd Silence who will evaluate for admission. [MB]  2038 Patient told the hospitalist he is making a story about the falls after he had a fight with his girlfriend.  He is ambulating in the department with minimal assistance.  Likely can be appropriate for discharge. [MB]    Clinical Course User Index [MB] Hayden Rasmussen, MD   MDM Rules/Calculators/A&P                          This patient complains of dizziness and frequent falls; this involves an extensive number of treatment Options and is a complaint that carries with it a high risk of complications and Morbidity. The differential includes stroke, peripheral neuropathy, metabolic derangement, anemia,  I ordered, reviewed and interpreted labs, which included CBC with normal white count normal hemoglobin, chemistries fairly normal has some chronic CKD, urinalysis without signs of infection, troponins flat, COVID and flu testing negative I ordered imaging studies which included chest x-ray and I independently    visualized and interpreted imaging which showed no acute findings  Previous records obtained and reviewed in epic, has  had extensive work-up for patient's dizziness and unsteady gait. I consulted curbside Dr. Curly Shores from neurology, also [facility] hospitalist Dr.Shalhoub And discussed lab and imaging findings  Critical Interventions: None  After the interventions stated above, I reevaluated the patient and found patient to be clinically improved.  He is ambulating in the department without any assistance.  He does not have a way to get home so charge nurse is looking into possible Voucher.  Return instructions discussed   Final Clinical Impression(s) / ED Diagnoses Final diagnoses:  Frequent falls  Unsteady gait    Rx / DC Orders ED Discharge Orders     None        Hayden Rasmussen, MD 11/12/20 1058

## 2020-11-11 NOTE — ED Notes (Signed)
Pt stood up on the side of the bed to use the urinal. Pt was very unsteady and almost fell on the bed. Pt was assisted by staff so pt could stand up to use the urinal. Pt states he lives at home alone and has fallen multiple times in the last few days because he gets dizzy standing up and loses his balance. Will continue to monitor.

## 2020-11-11 NOTE — ED Notes (Signed)
Pt set off posey alarm. When staff entered the room, pt was found on the ground at the foot of the bed on his knees. Staffed helped patient back into bed and educated patient on using the call bell when pt needs something and to not ambulate out of the bed for pts safety.

## 2020-11-11 NOTE — ED Notes (Signed)
Posey alarm went off and found pt at the foot of the bed standing naked. Educated pt again about safety concerns d/t dizziness and that he needs to stay in the bed d/t high fall risk. Pt verbalized understanding at this time. Will continue to monitor.

## 2020-11-11 NOTE — Progress Notes (Signed)
HOSPITAL MEDICINE BRIEF ED ASSESSMENT NOTE  Called by ER provider due to patient exhibiting frequent falls in the emergency department with concerns for patient safety.  Initial plan was to place patient in observation, undergo a cursory work-up and perform physical therapy evaluation.  However, upon my evaluation the patient he stated that he fabricated his presentation due to an argument he had with his girlfriend.  He was brought into the emergency department due to feigning severe weakness and falls in an attempt to get her attention and states that he is actually fine other than being somewhat unsteady on his feet on occasion.  On examination patient is awake alert and oriented x3.  Patient is responding with appropriate answers to all of my questions.  Patient is neurologically intact with good strength in all extremities.  I personally observed patient walking up and down the hallway without difficulty in excess of 100 feet.  I have discussed the case with Dr. Melina Copa who additionally observed the patient ambulating and we both believe that the patient is likely safe to be discharged home.  Case management referral placed for patient to receive home health physical therapy assessment.  Patient will likely be discharged from the ED this evening.  Vernelle Emerald  MD [facility]

## 2020-11-11 NOTE — ED Triage Notes (Signed)
On and off weakness and dizziness since May. Pt reports it has been worse in the last 4 days. Fell this morning at home. Pt reports hitting head. Denies LOC. Denies taking blood thinners. Alert and oriented x 4.

## 2020-11-11 NOTE — ED Notes (Addendum)
Pt placed on posey alarm d/t pt keeps getting out of bed despite education to stay in bed and not get up without assistance d/t high fall risk.

## 2020-11-11 NOTE — ED Notes (Signed)
Pt found standing beside stretcher again. Before this RN was able to help pt get back in bed d/t being with another pt in the hallway, pt sat down in the floor. MD and another RN helped pt stand up and get back in bed.

## 2020-11-11 NOTE — ED Notes (Signed)
Posey alarm went off again. ED techs ran to pt room and reported to this RN that pt had fallen on the floor again. Staff assisted pt back to bed prior to this RN being able to come into the room. Soft restraints being obtained for pt safety. Notified Sponseller PA d/t Melina Copa MD being on the phone at the time of notification.

## 2020-11-11 NOTE — ED Notes (Signed)
Pt attempted to get out of bed again. Found pt at foot of bed w/ monitoring equipment removed. Instructed pt that he needs to stay in the bed so that he doesn't fall and get hurt. Pt scooted back in bed.

## 2020-11-11 NOTE — ED Notes (Signed)
Instructed patient to stand up to complete his orthostatic vitals. Patient stated that he felt dizzy and like he was "going to fall". He began to teeter on his feet, orthostatic vitals filed and patient was instructed to sit back in bed.

## 2020-11-11 NOTE — ED Notes (Addendum)
Pt set off posey alarm again. Went to check on pt and pt found in the floor at the foot of the stretcher. Pt reports he got lightheaded and fell in the floor. Pt was in seated position at end of bed in a position which looks like his feet slid out from under him. Pt reports he hit his head on the stretcher. Small bleeding abrasion noted to L forearm. Notified MD about pt fall.

## 2020-11-12 NOTE — TOC Initial Note (Signed)
Transition of Care [facility]) - Initial/Assessment Note    Patient Details  Name: [name] MRN: [id number] Date of Birth: [birth year]  [facility] CM/SW Contact:    Verdell Carmine, RN Phone Number: 11/12/2020, 3:59 PM  Clinical Narrative:                  One of many ED presentations for this 65 year old patient. He has not been consistent with PCP appointments. He came in with a fall by EMS< and then fell again while getting off stretcher, complained of weakness. Later he told MD that he did not fall, he was using as a ploy because he and his girlfriend had a argument. They all agreed to HHPT. Called Levada Dy from [facility] to see if she would accept patient, awaiting a return call. He will be a difficult acceptance due to insurance and his frequent  noncompliance with PCP appointments.        Patient Goals and CMS Choice        Expected Discharge Plan and Services      Home with home health                                          Prior Living Arrangements/Services                       Activities of Daily Living      Permission Sought/Granted                  Emotional Assessment              Admission diagnosis:  Fall [W19.XXXA] Patient Active Problem List   Diagnosis Date Noted   Fall 11/11/2020   Hypertensive urgency, malignant 10/25/2020   Acute urinary retention 10/25/2020   Adjustment disorder with mixed anxiety and depressed mood    Acute renal failure superimposed on stage 3a chronic kidney disease ([place]) 09/15/2020   Ataxia 08/01/2020   Dizziness 07/31/2020   Chest pain 07/31/2020   Hip pain 07/31/2020   AKI (acute kidney injury) ([place]) 02/11/2020   Hydroureteronephrosis 02/10/2020   Nausea and vomiting 02/10/2020   History of COVID-19 02/10/2020   Pneumonia due to COVID-19 virus 09/29/2019   Hyponatremia 09/28/2019   Sinus tachycardia 09/28/2019   Insomnia 08/26/2018   Nephropathy 03/26/2018   Urinary  urgency 03/26/2018   Type 2 diabetes mellitus with diabetic nephropathy, without long-term current use of insulin ([place]) 03/26/2018   Essential hypertension 03/26/2018   Chronic kidney disease (CKD), stage III (moderate) ([place]) 03/26/2018   Conductive hearing loss, bilateral 11/07/2017   Uncontrolled type 2 diabetes mellitus with hyperglycemia ([place]) 07/31/2016   Dyslipidemia associated with type 2 diabetes mellitus ([place]) 05/29/2016   Microcytic anemia 02/17/2012   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

## 2020-11-13 ENCOUNTER — Telehealth: Payer: Self-pay

## 2020-11-13 NOTE — Telephone Encounter (Signed)
Transition Care Management Unsuccessful Follow-up Telephone Call  Date of discharge and from where:  11/11/2020 [facility]   Attempts:  2nd Attempt  Reason for unsuccessful TCM follow-up call:  No answer/busy

## 2020-11-13 NOTE — Telephone Encounter (Signed)
Transition Care Management Unsuccessful Follow-up Telephone Call  Date of discharge and from where:  11/11/2020 [facility]   Attempts:  1st Attempt  Reason for unsuccessful TCM follow-up call:  No answer/busy

## 2020-11-14 NOTE — Progress Notes (Signed)
11/15/20 11:27 AM   [name] Oct 07, 1955 [id number]  Referring provider:  [name], [address] [place],  [place] [address] Chief Complaint  Patient presents with   Prostate Cancer     HPI: [name] is a 65 y.o.male who presents today for further evaluation of prostate cancer.   He has been followed at [facility] by a PA, [place], and a former patient of Dr. Felipa Eth . He has a past medical history significant for prostate cancer with recurrence, intermittent urinary retention, CKD stage IIIa,and right ureteral obstruction.   Prostate cancer:  His prostate biopsy results showed Gleason 3+3 with a PSA over 10. MRI of the prostate from 2/16 showed a focus in the left mid gland potentially representing prostate cancer but indeterminant for malignancy, no suspicious areas in the right gland, possible involvement of the left neurovascular bundle, no adenopathy, no bony metastasis, bladder wall thickening. He was seen by Dr. Pablo Ledger in radiation oncology. Treatment with external beam radiation versus surgery was discussed.  He was evaluated with a prostate MRI which showed a volume of 78 cm and no radiographic evidence of high-grade prostate cancer. He underwent repeat transrectal ultrasound and biopsy of the prostate on 07/22/17. He denied care for this.    No recent PSAs.   BPH with LUTS: He was previously on Rapaflo for LUTS but discontinued this medication. He noted urgency and urge incontinence within the past 4 months and was placed on Myrbetriq 25 mg daily by his PCP. He did not see any improvement in his symptoms. He was not taking alfuzosin. Urine culture from 7/20 grew 10-150K candida. He was treated with fluconazole.   History of Erectile dysfunction:  He has used Cialis with good results  History of right ureteral calculus:   During ureteroscopy, he suffered a right renal ureteral avulsion. This was managed with an exploratory laparotomy, ureteral  reimplant with Boari flap.  Right renal atrophy : He was evaluated with a CT at [facility]. This showed right renal atrophy with moderate to severe right hydronephrosis, severe distal hydroureter extending from the bladder up to the surgical anastomosis at the psoas hitch, nonobstructing calculus in the inferior right kidney, no left hydronephrosis or renal calculi. Renogram from May 2021 showed 12% function of the right kidney, 88% function in the left kidney. Further evaluation with a right nephrostomy tube followed by antegrade nephrostogram and possible repeat Lasix renogram recommended. He canceled the scheduled procedure.  On 10/25/2020 he had CT abdomen pelvis with contrast which showed adrenal glands appearing normal.  Stable left renal cysts are noted. Stable right renal cortical atrophy is noted with moderate hydroureteronephrosis which extends to surgical site in midportion of right ureter. Urinary bladder is decompressed secondary to Foley catheter. No renal or ureteral calculi are noted.Scrotal ultrasound showed small bilateral hydroceles and negative for testicular torsion.   Recent Emergency room visit:  He was seen by Dr. Nolberto Hanlon in ED on 10/29/2020 who stated patient due to prostatomegaly from prostate cancer  is experiencing recurrent urinary obstruction.  He is doing occasional straight cath at home.  He failed voiding trial while inpatient in which Foley catheter was inserted. Unfortunately he on 10/29/2020 pulled out his Foley catheter. Creatinine was checked in ED and at time of discharge was 2.10.   Urinalysis today was negative.   He states today that his bladder has been hurting and he has been experiencing strain.    IPSS  [name] 11/15/20 1000         International Prostate Symptom Score   How often have you had the sensation of not emptying your bladder? Almost always     How often have you had to urinate less than every two hours? More than half the time      How often have you found you stopped and started again several times when you urinated? More than half the time     How often have you found it difficult to postpone urination? More than half the time     How often have you had a weak urinary stream? Almost always     How often have you had to strain to start urination? More than half the time     How many times did you typically get up at night to urinate? 2 Times     Total IPSS Score 28           Quality of Life due to urinary symptoms   If you were to spend the rest of your life with your urinary condition just the way it is now how would you feel about that? Unhappy              Score:  1-7 Mild 8-19 Moderate 20-35 Severe    PMH: Past Medical History:  Diagnosis Date   Acute kidney injury superimposed on chronic kidney disease ([place]) 02/10/2020   Acute on chronic kidney failure ([place]) 09/28/2019   Closed nondisplaced spiral fracture of shaft of right tibia    Complicated urinary tract infection 09/06/2016   Depression    Diabetes mellitus without complication (HCC)    GERD (gastroesophageal reflux disease)    Gout    right foot   Hemorrhoids    Hypertension    Hypertension    Macular degeneration    Microcytic anemia    Prostate cancer ([place]) 08/2009   Rectal bleeding 07/31/2016   Tubular adenoma     Surgical History: Past Surgical History:  Procedure Laterality Date   CYSTOSCOPY WITH RETROGRADE PYELOGRAM, URETEROSCOPY AND STENT PLACEMENT Right 07/31/2016   Procedure: CYSTOSCOPY WITH RIGHT  RETROGRADE PYELOGRAM, URETEROSCOPY;  Surgeon: [name], MD;  Location: [facility];  Service: Urology;  Laterality: Right;   INGUINAL HERNIA REPAIR     Left ankle joint fusion  1981   PROSTATE BIOPSY     x 2   URETERAL REIMPLANTION  07/31/2016   Procedure: URETERAL REIMPLANT, right boari flap, right psoas hitch;  Surgeon: [name], MD;  Location: [facility];  Service: Urology;;    Home Medications:  Allergies as  of 11/15/2020   No Known Allergies      Medication List        Accurate as of November 15, 2020 11:27 AM. If you have any questions, ask your nurse or doctor.          STOP taking these medications    Myrbetriq 25 MG Tb24 tablet Generic drug: mirabegron ER Stopped by: Hollice Espy, MD       TAKE these medications    acetaminophen 325 MG tablet Commonly known as: TYLENOL Take 2 tablets (650 mg total) by mouth every 6 (six) hours as needed for mild pain (or Fever >/= 101).   alfuzosin 10 MG 24 hr tablet Commonly known as: UROXATRAL Take 1 tablet (10 mg total) by mouth daily with breakfast. Started by: Hollice Espy, MD   aspirin EC 81 MG tablet Take 81  mg by mouth daily with breakfast.   atorvastatin 40 MG tablet Commonly known as: LIPITOR 1 tablet by mouth Monday - Friday   clotrimazole-betamethasone cream Commonly known as: LOTRISONE Apply 1 application topically 2 (two) times daily.   diclofenac Sodium 1 % Gel Commonly known as: Voltaren Apply 4 g topically 4 (four) times daily.   metoprolol tartrate 50 MG tablet Commonly known as: LOPRESSOR Take 1 tablet (50 mg total) by mouth 2 (two) times daily.   nystatin powder Commonly known as: nystatin Apply 1 application topically 3 (three) times daily.   olmesartan-hydrochlorothiazide 40-25 MG tablet Commonly known as: BENICAR HCT Take 1 tablet by mouth daily.   sildenafil 20 MG tablet Commonly known as: REVATIO Take 20-100 mg by mouth daily as needed for erectile dysfunction.   Tradjenta 5 MG Tabs tablet Generic drug: linagliptin TAKE 1 TABLET BY MOUTH EVERY DAY   traMADol 50 MG tablet Commonly known as: ULTRAM Take 1 tablet (50 mg total) by mouth every 6 (six) hours as needed for severe pain.   traZODone 50 MG tablet Commonly known as: DESYREL TAKE 1 TABLET (50 MG TOTAL) BY MOUTH AT BEDTIME AS NEEDED FOR SLEEP.   VISINE OP Place 1 drop into both eyes daily as needed (itching/irritation).         Allergies: No Known Allergies  Family History: Family History  Problem Relation Age of Onset   Lung cancer Mother        Deceased   Throat cancer Brother    Pancreatic cancer Father    Heart disease Father        Deceased   Lung cancer Maternal Uncle        nephew   Diabetes Other     Social History:  reports that he has never smoked. He has never used smokeless tobacco. He reports current alcohol use. He reports that he does not use drugs.   Physical Exam: BP (!) 148/90   Pulse 92   Ht '5\' 6"'$  (1.676 m)   Wt 185 lb (83.9 kg)   BMI 29.86 kg/m   Constitutional:  Alert and oriented, No acute distress. HEENT: [place] AT, moist mucus membranes.  Trachea midline, no masses. Cardiovascular: No clubbing, cyanosis, or edema. Respiratory: Normal respiratory effort, no increased work of breathing. Testicles: normal, no masses Rectal: normal sphincter tone, mildly enlarged, no nodules Skin: No rashes, bruises or suspicious lesions. Neurologic: Grossly intact, no focal deficits, moving all 4 extremities. Psychiatric: Normal mood and affect.  Laboratory Data:  Lab Results  Component Value Date   CREATININE 1.82 (H) 11/11/2020    Lab Results  Component Value Date   PSA 4.60 (H) 08/18/2008   PSA 4.29 (H) 06/18/2007    Lab Results  Component Value Date   HGBA1C 8.1 (H) 07/17/2020    Urinalysis   Pertinent Imaging: CLINICAL DATA:  Acute generalized abdominal pain. History of prostate cancer.   EXAM: CT ABDOMEN AND PELVIS WITH CONTRAST   TECHNIQUE: Multidetector CT imaging of the abdomen and pelvis was performed using the standard protocol following bolus administration of intravenous contrast.   CONTRAST:  59m OMNIPAQUE IOHEXOL 350 MG/ML SOLN   COMPARISON:  February 10, 2020.   FINDINGS: Lower chest: No acute abnormality.   Hepatobiliary: No focal liver abnormality is seen. No gallstones, gallbladder wall thickening, or biliary dilatation.    Pancreas: Unremarkable. No pancreatic ductal dilatation or surrounding inflammatory changes.   Spleen: Normal in size without focal abnormality.   Adrenals/Urinary Tract: Adrenal  glands appear normal. Stable left renal cysts are noted. Stable right renal cortical atrophy is noted with moderate hydroureteronephrosis which extends to surgical site in midportion of right ureter. Urinary bladder is decompressed secondary to Foley catheter. No renal or ureteral calculi are noted.   Stomach/Bowel: Stomach is within normal limits. Appendix appears normal. No evidence of bowel wall thickening, distention, or inflammatory changes.   Vascular/Lymphatic: Aortic atherosclerosis. No enlarged abdominal or pelvic lymph nodes.   Reproductive: Stable moderate prostatic enlargement is noted.   Other: Small bilateral fat containing inguinal hernias are noted. No ascites is noted.   Musculoskeletal: No acute or significant osseous findings.   IMPRESSION: Stable moderate prostatic enlargement.   Stable right renal cortical atrophy is noted with associated moderate hydroureteronephrosis which extends to surgical site midportion of right ureter. Urinary bladder is decompressed secondary to Foley catheter.   Small bilateral fat containing inguinal hernias.   Aortic Atherosclerosis (ICD10-I70.0).     Electronically Signed   By: Marijo Conception M.D.   On: 10/25/2020 15:40   CLINICAL DATA:  Left greater than right scrotal pain   EXAM: SCROTAL ULTRASOUND   DOPPLER ULTRASOUND OF THE TESTICLES   TECHNIQUE: Complete ultrasound examination of the testicles, epididymis, and other scrotal structures was performed. Color and spectral Doppler ultrasound were also utilized to evaluate blood flow to the testicles.   COMPARISON:  Ultrasound 09/15/2020   FINDINGS: Right testicle   Measurements: 3.6 x 2.5 x 2.7 cm. No mass or microlithiasis visualized.   Left testicle   Measurements: 4.2 x  2.3 x 3 cm. No mass or microlithiasis visualized.   Right epididymis:  Normal in size and appearance.   Left epididymis:  Normal in size and appearance.   Hydrocele: Small bilateral hydroceles. Particulate debris within the hydroceles.   Varicocele:  None visualized.   Pulsed Doppler interrogation of both testes demonstrates normal low resistance arterial and venous waveforms bilaterally.   IMPRESSION: 1. Small bilateral hydroceles. 2. Negative for testicular torsion     Electronically Signed   By: Donavan Foil M.D.   On: 10/25/2020 17:55   Results for orders placed or performed in visit on 11/15/20  Bladder Scan (Post Void Residual) in office  Result Value Ref Range   Scan Result 123      Assessment & Plan:    Prostate cancer  - intermediate risk, has not been treated for this  - 12.88 PSA on 02/2020. PSA today  - Differed management  until next visit   2. BPH with LUTS  - Prescribed alfuzosin, resume this med in light of worsening symptoms - 40fered ystoscopy to evaluate prostate anatomy and bladder may consider and outlet procedure depending on goals.  - Prostate is 66.7 cc in 12/2019  3. Right renal atrophy  - chronic with poor renal function  - will differ treatment or replacement of nephroterostomy tube unless he has symptoms of infection   4. Chronic kidney disease  - baseline creatine at 1.6 and has remained stable   5. ED  - use sildenafil as needed   6. Testicular pain  - exam today unremarkable  - suspect this is chronic rather than infectious  -supportive care  - No need need for antibiotics urinalysis is negative  Follow-up with PSA results and cystoscopy  I,Kailey Littlejohn,acting as a scribe for AHollice Espy MD.,have documented all relevant documentation on the behalf of AHollice Espy MD,as directed by  AHollice Espy MD while in the presence of AHollice Espy  MD.  I have reviewed the above documentation for accuracy and  completeness, and I agree with the above.   Hollice Espy, MD   [facility] [address], [place], [place] [address] [phone]   I spent 75 total minutes on the day of the encounter including pre-visit review of the medical record, face-to-face time with the patient, and post visit ordering of labs/imaging/tests.  Primary time was spent reviewing the extensive GU history.

## 2020-11-15 ENCOUNTER — Encounter: Payer: Self-pay | Admitting: Urology

## 2020-11-15 ENCOUNTER — Ambulatory Visit: Payer: Self-pay

## 2020-11-15 ENCOUNTER — Ambulatory Visit (INDEPENDENT_AMBULATORY_CARE_PROVIDER_SITE_OTHER): Payer: Medicare Other | Admitting: Urology

## 2020-11-15 ENCOUNTER — Other Ambulatory Visit: Payer: Self-pay

## 2020-11-15 VITALS — BP 148/90 | HR 92 | Ht 66.0 in | Wt 185.0 lb

## 2020-11-15 DIAGNOSIS — R3915 Urgency of urination: Secondary | ICD-10-CM

## 2020-11-15 DIAGNOSIS — F329 Major depressive disorder, single episode, unspecified: Secondary | ICD-10-CM

## 2020-11-15 DIAGNOSIS — E1169 Type 2 diabetes mellitus with other specified complication: Secondary | ICD-10-CM | POA: Diagnosis not present

## 2020-11-15 DIAGNOSIS — N1831 Chronic kidney disease, stage 3a: Secondary | ICD-10-CM

## 2020-11-15 DIAGNOSIS — I160 Hypertensive urgency: Secondary | ICD-10-CM

## 2020-11-15 DIAGNOSIS — E1121 Type 2 diabetes mellitus with diabetic nephropathy: Secondary | ICD-10-CM

## 2020-11-15 DIAGNOSIS — R42 Dizziness and giddiness: Secondary | ICD-10-CM

## 2020-11-15 DIAGNOSIS — I10 Essential (primary) hypertension: Secondary | ICD-10-CM | POA: Diagnosis not present

## 2020-11-15 DIAGNOSIS — C61 Malignant neoplasm of prostate: Secondary | ICD-10-CM

## 2020-11-15 DIAGNOSIS — E785 Hyperlipidemia, unspecified: Secondary | ICD-10-CM

## 2020-11-15 LAB — BLADDER SCAN AMB NON-IMAGING: Scan Result: 123

## 2020-11-15 MED ORDER — ALFUZOSIN HCL ER 10 MG PO TB24: 10.0000 mg | ORAL_TABLET | Freq: Every day | ORAL | 6 refills | Status: DC

## 2020-11-15 NOTE — Chronic Care Management (AMB) (Signed)
  Care Management    Consult Note  11/15/2020 Name: [name] MRN: [id number] DOB: September 16, 1955  Care management team received notification of patient's recent emergency department visit related to  dizziness  .Based on review of health record, [name] is currently active in the embedded care coordination program.. Outbound call placed to the patient to assist with care coordination needs.   Review of patient status, including review of consultants reports, relevant laboratory and other test results, and collaboration with appropriate care team members and the patient's provider was performed as part of comprehensive patient evaluation and provision of chronic care management services.    SW placed a successful outbound call to the patient to assess for care coordination needs. Patient reports he is currently on the way to a urology appointment and requests a call at a later time.     Plan:  SW will contact the patient on 9/2.  [name], BSW, CDP Social Worker, Certified Dementia Practitioner [place] [facility] [place] [facility] [phone]

## 2020-11-15 NOTE — Patient Instructions (Signed)
Cystoscopy Cystoscopy is a procedure that is used to help diagnose and sometimes treat conditions that affect the lower urinary tract. The lower urinary tract includes the bladder and the urethra. The urethra is the tube that drains urine from the bladder. Cystoscopy is done using a thin, tube-shaped instrument with a light and camera at the end (cystoscope). The cystoscope may be hard or flexible, depending on the goal of the procedure. The cystoscope is inserted through the urethra, into the bladder. Cystoscopy may be recommended if you have: Urinary tract infections that keep coming back. Blood in the urine (hematuria). An inability to control when you urinate (urinary incontinence) or an overactive bladder. Unusual cells found in a urine sample. A blockage in the urethra, such as a urinary stone. Painful urination. An abnormality in the bladder found during an intravenous pyelogram (IVP) or CT scan. Cystoscopy may also be done to remove a sample of tissue to be examined under a microscope (biopsy). What are the risks? Generally, this is a safe procedure. However, problems may occur, including: Infection. Bleeding.  What happens during the procedure?  You will be given one or more of the following: A medicine to numb the area (local anesthetic). The area around the opening of your urethra will be cleaned. The cystoscope will be passed through your urethra into your bladder. Germ-free (sterile) fluid will flow through the cystoscope to fill your bladder. The fluid will stretch your bladder so that your health care provider can clearly examine your bladder walls. Your doctor will look at the urethra and bladder. The cystoscope will be removed The procedure may vary among health care providers  What can I expect after the procedure? After the procedure, it is common to have: Some soreness or pain in your abdomen and urethra. Urinary symptoms. These include: Mild pain or burning when you  urinate. Pain should stop within a few minutes after you urinate. This may last for up to 1 week. A small amount of blood in your urine for several days. Feeling like you need to urinate but producing only a small amount of urine. Follow these instructions at home: General instructions Return to your normal activities as told by your health care provider.  Do not drive for 24 hours if you were given a sedative during your procedure. Watch for any blood in your urine. If the amount of blood in your urine increases, call your health care provider. If a tissue sample was removed for testing (biopsy) during your procedure, it is up to you to get your test results. Ask your health care provider, or the department that is doing the test, when your results will be ready. Drink enough fluid to keep your urine pale yellow. Keep all follow-up visits as told by your health care provider. This is important. Contact a health care provider if you: Have pain that gets worse or does not get better with medicine, especially pain when you urinate. Have trouble urinating. Have more blood in your urine. Get help right away if you: Have blood clots in your urine. Have abdominal pain. Have a fever or chills. Are unable to urinate. Summary Cystoscopy is a procedure that is used to help diagnose and sometimes treat conditions that affect the lower urinary tract. Cystoscopy is done using a thin, tube-shaped instrument with a light and camera at the end. After the procedure, it is common to have some soreness or pain in your abdomen and urethra. Watch for any blood in your urine.   If the amount of blood in your urine increases, call your health care provider. If you were prescribed an antibiotic medicine, take it as told by your health care provider. Do not stop taking the antibiotic even if you start to feel better. This information is not intended to replace advice given to you by your health care provider. Make  sure you discuss any questions you have with your health care provider. Document Revised: 02/24/2018 Document Reviewed: 02/24/2018 Elsevier Patient Education  2020 [employer]  

## 2020-11-16 LAB — PSA: Prostate Specific Ag, Serum: 15.6 ng/mL — ABNORMAL HIGH (ref 0.0–4.0)

## 2020-11-17 ENCOUNTER — Ambulatory Visit (INDEPENDENT_AMBULATORY_CARE_PROVIDER_SITE_OTHER): Payer: Medicare Other

## 2020-11-17 DIAGNOSIS — N1831 Chronic kidney disease, stage 3a: Secondary | ICD-10-CM

## 2020-11-17 DIAGNOSIS — L600 Ingrowing nail: Secondary | ICD-10-CM | POA: Diagnosis not present

## 2020-11-17 DIAGNOSIS — B351 Tinea unguium: Secondary | ICD-10-CM | POA: Diagnosis not present

## 2020-11-17 DIAGNOSIS — E1121 Type 2 diabetes mellitus with diabetic nephropathy: Secondary | ICD-10-CM

## 2020-11-17 DIAGNOSIS — I10 Essential (primary) hypertension: Secondary | ICD-10-CM

## 2020-11-17 DIAGNOSIS — L03032 Cellulitis of left toe: Secondary | ICD-10-CM | POA: Diagnosis not present

## 2020-11-17 DIAGNOSIS — L03031 Cellulitis of right toe: Secondary | ICD-10-CM | POA: Diagnosis not present

## 2020-11-17 LAB — URINALYSIS, COMPLETE
Bilirubin, UA: NEGATIVE
Ketones, UA: NEGATIVE
Leukocytes,UA: NEGATIVE
Nitrite, UA: NEGATIVE
Specific Gravity, UA: 1.02 (ref 1.005–1.030)
Urobilinogen, Ur: 0.2 mg/dL (ref 0.2–1.0)
pH, UA: 5.5 (ref 5.0–7.5)

## 2020-11-17 LAB — MICROSCOPIC EXAMINATION
Bacteria, UA: NONE SEEN
Epithelial Cells (non renal): NONE SEEN /hpf (ref 0–10)
RBC, Urine: NONE SEEN /hpf (ref 0–2)

## 2020-11-17 NOTE — Chronic Care Management (AMB) (Signed)
Chronic Care Management    Social Work Note  11/17/2020 Name: [name] MRN: [id number] DOB: Jun 18, 1955  [name] is a 65 y.o. year old male who is a primary care patient of [name], [place]. The CCM team was consulted to assist the patient with chronic disease management and/or care coordination needs related to:  CKD III, DM II, and HTN .   Engaged with patient by telephone for follow up visit in response to provider referral for social work chronic care management and care coordination services.   Consent to Services:  The patient was given information about Chronic Care Management services, agreed to services, and gave verbal consent prior to initiation of services.  Please see initial visit note for detailed documentation.   Patient agreed to services and consent obtained.   Assessment: Review of patient past medical history, allergies, medications, and health status, including review of relevant consultants reports was performed today as part of a comprehensive evaluation and provision of chronic care management and care coordination services.     SDOH (Social Determinants of Health) assessments and interventions performed:    Advanced Directives Status: Not addressed in this encounter.  CCM Care Plan  No Known Allergies  Outpatient Encounter Medications as of 11/17/2020  Medication Sig Note   acetaminophen (TYLENOL) 325 MG tablet Take 2 tablets (650 mg total) by mouth every 6 (six) hours as needed for mild pain (or Fever >/= 101). 10/03/2020: As needed   alfuzosin (UROXATRAL) 10 MG 24 hr tablet Take 1 tablet (10 mg total) by mouth daily with breakfast.    aspirin EC 81 MG tablet Take 81 mg by mouth daily with breakfast.     atorvastatin (LIPITOR) 40 MG tablet 1 tablet by mouth Monday - Friday    clotrimazole-betamethasone (LOTRISONE) cream Apply 1 application topically 2 (two) times daily.    diclofenac Sodium (VOLTAREN) 1 % GEL Apply 4 g topically 4 (four) times daily.     metoprolol tartrate (LOPRESSOR) 50 MG tablet Take 1 tablet (50 mg total) by mouth 2 (two) times daily.    nystatin (NYSTATIN) powder Apply 1 application topically 3 (three) times daily.    olmesartan-hydrochlorothiazide (BENICAR HCT) 40-25 MG tablet Take 1 tablet by mouth daily.    sildenafil (REVATIO) 20 MG tablet Take 20-100 mg by mouth daily as needed for erectile dysfunction.    Tetrahydrozoline HCl (VISINE OP) Place 1 drop into both eyes daily as needed (itching/irritation).    TRADJENTA 5 MG TABS tablet TAKE 1 TABLET BY MOUTH EVERY DAY    traMADol (ULTRAM) 50 MG tablet Take 1 tablet (50 mg total) by mouth every 6 (six) hours as needed for severe pain.    traZODone (DESYREL) 50 MG tablet TAKE 1 TABLET (50 MG TOTAL) BY MOUTH AT BEDTIME AS NEEDED FOR SLEEP.    No facility-administered encounter medications on file as of 11/17/2020.    Patient Active Problem List   Diagnosis Date Noted   Fall 11/11/2020   Hypertensive urgency, malignant 10/25/2020   Acute urinary retention 10/25/2020   Adjustment disorder with mixed anxiety and depressed mood    Acute renal failure superimposed on stage 3a chronic kidney disease ([place]) 09/15/2020   Ataxia 08/01/2020   Dizziness 07/31/2020   Chest pain 07/31/2020   Hip pain 07/31/2020   AKI (acute kidney injury) ([place]) 02/11/2020   Hydroureteronephrosis 02/10/2020   Nausea and vomiting 02/10/2020   History of COVID-19 02/10/2020   Pneumonia due to COVID-19 virus 09/29/2019  Hyponatremia 09/28/2019   Sinus tachycardia 09/28/2019   Insomnia 08/26/2018   Nephropathy 03/26/2018   Urinary urgency 03/26/2018   Type 2 diabetes mellitus with diabetic nephropathy, without long-term current use of insulin ([place]) 03/26/2018   Essential hypertension 03/26/2018   Chronic kidney disease (CKD), stage III (moderate) ([place]) 03/26/2018   Conductive hearing loss, bilateral 11/07/2017   Uncontrolled type 2 diabetes mellitus with hyperglycemia ([place]) 07/31/2016    Dyslipidemia associated with type 2 diabetes mellitus ([place]) 05/29/2016   Microcytic anemia 02/17/2012   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   Benign hypertension 02/17/2012   Depressive disorder 02/17/2012    Conditions to be addressed/monitored: HTN, DMII, and CKD Stage III  Care Plan : Social Work [place]  Updates made by [name] since 11/17/2020 12:00 AM     Problem: Home and Family Safety (Wellness)      Goal: Patient Safety Concerns   Start Date: 10/20/2020  Expected End Date: 11/19/2020  This Visit's Progress: On track  Priority: High  Note:   Current Barriers:  Chronic disease management support and education needs related to HTN, DM, and CKD Stage III   8 ED visits during the month of July  Majority of which he left AMA - One ED visit he expressed depression due to fight with his girlfriend stating "he cannot be happy without her" per provider documentation the patient was screened and provided with outpatient [place] resources Unable to contact the patient - contact number is no longer working Patient was a no call no show for office visit with primary provider on 8.4.22  Social Worker Clinical Goal(s):  SW will contact [employer] department to request a welfare check based on above barriers SW will mail patient a letter requesting he contact a member of the care management team SW will collaborate with patients primary care team as needed to discuss concerns for patient safety  SW Interventions:  Inter-disciplinary care team collaboration (see longitudinal plan of care) Collaboration with [name], [place] regarding development and update of comprehensive plan of care as evidenced by provider attestation and co-signature Successful outbound call placed to the patient to assess for care coordination needs Determined the patient has been spending time today with his lady friend and feels better stating "I think we will work out" Assisted in scheduling the  patient an office visit to see his primary provider on 10/11 Discussed opportunity to have [place] Management NP Deloria Lair make a home visit to assist with patient care needs - patient agreed Advised the patient Mrs. Spinks would contact him directly to schedule this home visit Collaboration with Deloria Lair to advise patient is interested in a home visit Scheduled follow up call over the next 30 days  Patient Goals/Self-Care Activities patient will:   -  Attend upcoming primary care appointment on 10/11 -Engage with Deloria Lair to schedule an in home visit -Contact SW as needed prior to next scheduled call  Follow Up Plan:  SW will continue to follow       Follow Up Plan: SW will follow up with patient by phone over the next month      [name], BSW, CDP Social Worker, Certified Dementia Practitioner [place] / [place] [facility] [phone]

## 2020-11-17 NOTE — Patient Instructions (Signed)
Social Worker Visit Information  Goals we discussed today:   Goals Addressed             This Visit's Progress    Patient Safety Concerns   On track    Timeframe:  Short-Term Goal Priority:  High Start Date:  8.5.22                           Expected End Date: 9.4.22                        Patient Goals/Self-Care Activities patient will:   - Attend upcoming primary care appointment on 10/11 -Engage with Deloria Lair to schedule an in home visit -Contact SW as needed prior to next scheduled call          Follow Up Plan: SW will follow up with patient by phone over the next month   [name], BSW, CDP Social Worker, Certified Dementia Practitioner [place] / [place] Management [phone]

## 2020-11-22 ENCOUNTER — Telehealth: Payer: Self-pay

## 2020-11-22 ENCOUNTER — Telehealth: Payer: Medicare Other

## 2020-11-22 ENCOUNTER — Other Ambulatory Visit: Payer: Self-pay | Admitting: *Deleted

## 2020-11-22 NOTE — Telephone Encounter (Signed)
  Care Management   Follow Up Note   11/22/2020 Name: [name] MRN: [id number] DOB: [birth year]   Referred by: [name], [name] Reason for referral : Chronic Care Management (RN CM Follow up call )   An unsuccessful telephone outreach was attempted today. The patient was referred to the case management team for assistance with care management and care coordination. I spoke with [name] briefly today, unfortunately he states he is not available to speak with me at this time. He is agreeable to a call back in a couple of weeks.   Follow Up Plan: Telephone follow up appointment with care management team member scheduled for: 12/05/20  Barb Merino, RN, BSN, CCM Care Management Coordinator [place] Management/[facility]  Direct Phone: [phone]

## 2020-11-24 ENCOUNTER — Other Ambulatory Visit: Payer: Self-pay | Admitting: *Deleted

## 2020-11-24 NOTE — Patient Outreach (Signed)
[facility]) Care Management  11/24/2020  [name] 1956-01-02 [id number]  Telephone call to pt prior to leaving office to go to pt home for a visit there. Pt answered and reports again that he is not available for the scheduled visit. He states he is across town at a friends home and doesn't feel well.  Advised pt that it is important for him to prioritize this home visit as the NP is acting as a part of his health team and will advocate for him. Encouraged him to commit to a third appt and he says that he will participate.  After review of NP schedule, called pt back to advise can visit at a time convenient to him either Monday or Tuesday next week, but he did not answer his phone and he does not have a mailbox set up. I sent a text, however, I am not certain that he is able to communicate in this way.  NP to call again on Monday to see if we can pin down an appropriate time.  If pt cancels on the third attempt, NP will not pursue the effort to see him.  Randy Pont. Myrtie Neither, MSN, [facility] Gerontological Nurse Practitioner [facility] Care Management [phone]

## 2020-11-27 ENCOUNTER — Other Ambulatory Visit: Payer: Self-pay | Admitting: *Deleted

## 2020-11-27 NOTE — Patient Outreach (Signed)
[place] [facility]) Care Management  11/27/2020  RAVON TSOU 01-Jul-1955 [id number]  Telephone outreach to reschedule 3rd attempt for a home visit. Pt has "NO SHOWED" on prior 2 attempts. No answer and mailbox is not set up. Sent a text to mobile phone but unsure if pt using texting for communication.  Eulah Pont. Myrtie Neither, MSN, [facility] Gerontological Nurse Practitioner [facility] Care Management [phone]

## 2020-11-27 NOTE — Patient Outreach (Signed)
[facility] [facility]) Care Management  11/27/2020  SHOHEI FOGLIA [birth year] [id number]  Called pt to schedule appt. He did answer this evening but he said he would not be available, tomorrow, he has an MD appt. Advised NP will have to call next week to try to schedule. Mr. Stensrud agrees with this. I will call him next Monday, 12/04/20.  Eulah Pont. Myrtie Neither, MSN, [facility] Gerontological Nurse Practitioner [facility] Care Management [phone]

## 2020-11-30 DIAGNOSIS — R3914 Feeling of incomplete bladder emptying: Secondary | ICD-10-CM | POA: Diagnosis not present

## 2020-11-30 DIAGNOSIS — N261 Atrophy of kidney (terminal): Secondary | ICD-10-CM | POA: Diagnosis not present

## 2020-11-30 DIAGNOSIS — N138 Other obstructive and reflux uropathy: Secondary | ICD-10-CM | POA: Diagnosis not present

## 2020-12-04 ENCOUNTER — Ambulatory Visit: Payer: Self-pay

## 2020-12-04 ENCOUNTER — Telehealth: Payer: Self-pay | Admitting: Nurse Practitioner

## 2020-12-04 ENCOUNTER — Other Ambulatory Visit: Payer: Self-pay | Admitting: *Deleted

## 2020-12-04 DIAGNOSIS — E1121 Type 2 diabetes mellitus with diabetic nephropathy: Secondary | ICD-10-CM

## 2020-12-04 DIAGNOSIS — I10 Essential (primary) hypertension: Secondary | ICD-10-CM

## 2020-12-04 DIAGNOSIS — N1831 Chronic kidney disease, stage 3a: Secondary | ICD-10-CM

## 2020-12-04 NOTE — Telephone Encounter (Signed)
Called patient to schedule AWV.  Patient stated he was depressed and suicidal. I reach out through teams to  nickeah @ [facility].  She told me to reach out to [facility].  Patient kept crying wanting to talk to his lady friend.  He wanted someone to talk to his lady friend.  Patient stated he may have to go to hospital to get straightened out.  Tillie Rung called crisis team.  Per patient he did not have call waiting.  I was on phone with patient approx 34 minutes.  Tillie Rung had me hang up so crisis team could call and talk to patient

## 2020-12-04 NOTE — Patient Outreach (Signed)
[facility]) Care Management  12/04/2020  UVALDO RYBACKI Dec 30, 1955 [id number]  Final telephone outreach to schedule a home visit.  NP spoke with Mr. Ferencz today and he says he has too much going on to see me. This was my final attempt to outreach Mr. Hulbert. NP has scheduled two home visits previously. The first NP went to pt home and he was not there. NP called and Mr. Bednarczyk said he was across town and wouldn't be able to meet with me. The second time, NP called pt prior to the visit to ensure he was at home and he was not, but answered the phone and said he was across town and wouldn't be able to meet with me.  NP will advise embedded care team that their request was unable to be met due to the pt's reluctance.  Eulah Pont. Myrtie Neither, MSN, [facility] Gerontological Nurse Practitioner [facility] Care Management [phone]

## 2020-12-05 ENCOUNTER — Telehealth: Payer: Medicare Other

## 2020-12-05 ENCOUNTER — Telehealth: Payer: Self-pay

## 2020-12-05 ENCOUNTER — Ambulatory Visit: Payer: Self-pay

## 2020-12-05 ENCOUNTER — Telehealth: Payer: Self-pay | Admitting: Nurse Practitioner

## 2020-12-05 DIAGNOSIS — E1169 Type 2 diabetes mellitus with other specified complication: Secondary | ICD-10-CM

## 2020-12-05 DIAGNOSIS — F329 Major depressive disorder, single episode, unspecified: Secondary | ICD-10-CM

## 2020-12-05 DIAGNOSIS — E1121 Type 2 diabetes mellitus with diabetic nephropathy: Secondary | ICD-10-CM

## 2020-12-05 DIAGNOSIS — N1831 Chronic kidney disease, stage 3a: Secondary | ICD-10-CM

## 2020-12-05 DIAGNOSIS — F32A Depression, unspecified: Secondary | ICD-10-CM

## 2020-12-05 DIAGNOSIS — I10 Essential (primary) hypertension: Secondary | ICD-10-CM

## 2020-12-05 DIAGNOSIS — C61 Malignant neoplasm of prostate: Secondary | ICD-10-CM

## 2020-12-05 NOTE — Telephone Encounter (Signed)
  Care Management   Follow Up Note   12/05/2020 Name: [name] MRN: [id number] DOB: [birth year]   Referred by: [name], [name] Reason for referral : Chronic Care Management (Unsuccessful call)   An unsuccessful telephone outreach was attempted today. The patient was referred to the case management team for assistance with care management and care coordination.   Follow Up Plan:  Collaboration with RN Care Manager regarding unsuccessful call. RN Care Manager will also attempt to contact the patient today.  [name], BSW, CDP Social Worker, Certified Dementia Practitioner [place] / [place] Management [phone]

## 2020-12-05 NOTE — Telephone Encounter (Signed)
Attempted to return call to patient unable to leave voicemail due to it not being set up.

## 2020-12-05 NOTE — Patient Instructions (Signed)
Social Worker Visit Information  Goals we discussed today:   Goals Addressed             This Visit's Progress    Patient Safety Concerns       Timeframe:  Short-Term Goal Priority:  High Start Date:  8.5.22                           Expected End Date: 9.4.22                        Patient Goals/Self-Care Activities patient will:   - Engage with mobile crisis         Follow Up Plan: SW will follow up with patient by phone over the next 3 days  [name], BSW, CDP Social Worker, Certified Dementia Practitioner [place] / [place] Management [phone]

## 2020-12-05 NOTE — Chronic Care Management (AMB) (Addendum)
Chronic Care Management    Social Work Note  12/05/2020 Name: [name] MRN: [id number] DOB: Jun 13, 1955  [name] is a 65 y.o. year old male who is a primary care patient of [name], [place]. The CCM team was consulted to assist the patient with chronic disease management and/or care coordination needs related to:  CKD III, DM II, HTN, and mental health concerns .   Collaboration with Mobile Crisis  for  concerns with patient mental health needs  in response to provider referral for social work chronic care management and care coordination services.   Consent to Services:  The patient was given information about Chronic Care Management services, agreed to services, and gave verbal consent prior to initiation of services.  Please see initial visit note for detailed documentation.   Patient agreed to services and consent obtained.   Assessment: Review of patient past medical history, allergies, medications, and health status, including review of relevant consultants reports was performed today as part of a comprehensive evaluation and provision of chronic care management and care coordination services.     SDOH (Social Determinants of Health) assessments and interventions performed:    Advanced Directives Status: Not addressed in this encounter.  CCM Care Plan  No Known Allergies  Outpatient Encounter Medications as of 12/04/2020  Medication Sig Note   acetaminophen (TYLENOL) 325 MG tablet Take 2 tablets (650 mg total) by mouth every 6 (six) hours as needed for mild pain (or Fever >/= 101). 10/03/2020: As needed   alfuzosin (UROXATRAL) 10 MG 24 hr tablet Take 1 tablet (10 mg total) by mouth daily with breakfast.    aspirin EC 81 MG tablet Take 81 mg by mouth daily with breakfast.     atorvastatin (LIPITOR) 40 MG tablet 1 tablet by mouth Monday - Friday    clotrimazole-betamethasone (LOTRISONE) cream Apply 1 application topically 2 (two) times daily.    diclofenac Sodium  (VOLTAREN) 1 % GEL Apply 4 g topically 4 (four) times daily.    metoprolol tartrate (LOPRESSOR) 50 MG tablet Take 1 tablet (50 mg total) by mouth 2 (two) times daily.    nystatin (NYSTATIN) powder Apply 1 application topically 3 (three) times daily.    olmesartan-hydrochlorothiazide (BENICAR HCT) 40-25 MG tablet Take 1 tablet by mouth daily.    sildenafil (REVATIO) 20 MG tablet Take 20-100 mg by mouth daily as needed for erectile dysfunction.    Tetrahydrozoline HCl (VISINE OP) Place 1 drop into both eyes daily as needed (itching/irritation).    TRADJENTA 5 MG TABS tablet TAKE 1 TABLET BY MOUTH EVERY DAY    traMADol (ULTRAM) 50 MG tablet Take 1 tablet (50 mg total) by mouth every 6 (six) hours as needed for severe pain.    traZODone (DESYREL) 50 MG tablet TAKE 1 TABLET (50 MG TOTAL) BY MOUTH AT BEDTIME AS NEEDED FOR SLEEP.    No facility-administered encounter medications on file as of 12/04/2020.    Patient Active Problem List   Diagnosis Date Noted   Fall 11/11/2020   Hypertensive urgency, malignant 10/25/2020   Acute urinary retention 10/25/2020   Adjustment disorder with mixed anxiety and depressed mood    Acute renal failure superimposed on stage 3a chronic kidney disease ([place]) 09/15/2020   Ataxia 08/01/2020   Dizziness 07/31/2020   Chest pain 07/31/2020   Hip pain 07/31/2020   AKI (acute kidney injury) ([place]) 02/11/2020   Hydroureteronephrosis 02/10/2020   Nausea and vomiting 02/10/2020   History of COVID-19 02/10/2020  Pneumonia due to COVID-19 virus 09/29/2019   Hyponatremia 09/28/2019   Sinus tachycardia 09/28/2019   Insomnia 08/26/2018   Nephropathy 03/26/2018   Urinary urgency 03/26/2018   Type 2 diabetes mellitus with diabetic nephropathy, without long-term current use of insulin ([place]) 03/26/2018   Essential hypertension 03/26/2018   Chronic kidney disease (CKD), stage III (moderate) ([place]) 03/26/2018   Conductive hearing loss, bilateral 11/07/2017   Uncontrolled type  2 diabetes mellitus with hyperglycemia ([place]) 07/31/2016   Dyslipidemia associated with type 2 diabetes mellitus ([place]) 05/29/2016   Microcytic anemia 02/17/2012   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   Benign hypertension 02/17/2012   Depressive disorder 02/17/2012    Conditions to be addressed/monitored: HTN, DMII, and CKD Stage III ; Mental Health Concerns   Care Plan : Social Work [place]  Updates made by [name] since 12/05/2020 12:00 AM     Problem: Home and Family Safety (Wellness)      Goal: Patient Safety Concerns   Start Date: 10/20/2020  Expected End Date: 11/19/2020  Recent Progress: On track  Priority: High  Note:   Current Barriers:  Chronic disease management support and education needs related to HTN, DM, and CKD Stage III   8 ED visits during the month of July  Majority of which he left AMA - One ED visit he expressed depression due to fight with his girlfriend stating "he cannot be happy without her" per provider documentation the patient was screened and provided with outpatient [place] resources Unable to contact the patient - contact number is no longer working Patient was a no call no show for office visit with primary provider on 8.4.22  Social Worker Clinical Goal(s):  SW will contact [employer] department to request a welfare check based on above barriers SW will mail patient a letter requesting he contact a member of the care management team SW will collaborate with patients primary care team as needed to discuss concerns for patient safety  SW Interventions:  Inter-disciplinary care team collaboration (see longitudinal plan of care) Collaboration with Randy Brine, [name] regarding development and update of comprehensive plan of care as evidenced by provider attestation and co-signature Collaboration with Randy Rink, NP who indicates patient has been a no show and/or cancelled three planned home visits and therefore she is unable to assist with  care management needs Communication received from care guide [name] stating "I have a patient suicidal on the phone" Care Guide indicates patient is at home and upset because he has not spoken to his lady friend Advised care guide to keep the patient on the phone while SW contacts mobile crisis Successful outbound call placed to Mobile Crisis intake - referral completed Communication sent to care guide advising the patient should expect an incoming call in the next several minutes from mobile crisis Inbound call received from [place] with mobile crisis indicating she has attempted to contact the patient x 4 without success - Deb indicates she is attempting to contact the patient to complete a phone assessment prior to arriving to patients home Determined the patient does not have call waiting Requested Mrs. Hayes end the call with the patient to allow mobile crisis to contact him Collaboration with Randy Brine [name] and [place] to advise of intervention and plan for patient to engage with mobile crisis  Patient Goals/Self-Care Activities patient will:  -Engage with Mobile Crisis  Follow Up Plan:  SW will continue to follow  Follow Up Plan: SW will follow up with patient by phone over the next 3 days      [name], BSW, CDP Social Worker, Certified Dementia Practitioner [place] / [place] Management [phone]

## 2020-12-06 ENCOUNTER — Ambulatory Visit (INDEPENDENT_AMBULATORY_CARE_PROVIDER_SITE_OTHER): Payer: Medicare Other | Admitting: Nurse Practitioner

## 2020-12-06 ENCOUNTER — Encounter: Payer: Self-pay | Admitting: Nurse Practitioner

## 2020-12-06 ENCOUNTER — Other Ambulatory Visit: Payer: Self-pay

## 2020-12-06 ENCOUNTER — Telehealth: Payer: Self-pay

## 2020-12-06 VITALS — BP 114/68 | HR 100 | Temp 99.2°F | Ht 63.2 in | Wt 178.6 lb

## 2020-12-06 DIAGNOSIS — R4586 Emotional lability: Secondary | ICD-10-CM

## 2020-12-06 DIAGNOSIS — F331 Major depressive disorder, recurrent, moderate: Secondary | ICD-10-CM

## 2020-12-06 DIAGNOSIS — E1121 Type 2 diabetes mellitus with diabetic nephropathy: Secondary | ICD-10-CM

## 2020-12-06 DIAGNOSIS — Z2821 Immunization not carried out because of patient refusal: Secondary | ICD-10-CM

## 2020-12-06 DIAGNOSIS — Z1211 Encounter for screening for malignant neoplasm of colon: Secondary | ICD-10-CM | POA: Diagnosis not present

## 2020-12-06 DIAGNOSIS — I10 Essential (primary) hypertension: Secondary | ICD-10-CM

## 2020-12-06 DIAGNOSIS — Z23 Encounter for immunization: Secondary | ICD-10-CM

## 2020-12-06 MED ORDER — TETANUS-DIPHTH-ACELL PERTUSSIS 5-2.5-18.5 LF-MCG/0.5 IM SUSP: 0.5000 mL | Freq: Once | INTRAMUSCULAR | 0 refills | Status: AC

## 2020-12-06 MED ORDER — ARIPIPRAZOLE 2 MG PO TABS: 2.0000 mg | ORAL_TABLET | Freq: Every day | ORAL | 0 refills | Status: DC

## 2020-12-06 MED ORDER — METOPROLOL TARTRATE 50 MG PO TABS: 50.0000 mg | ORAL_TABLET | Freq: Two times a day (BID) | ORAL | Status: DC

## 2020-12-06 MED ORDER — RYBELSUS 14 MG PO TABS: 1.0000 | ORAL_TABLET | Freq: Every day | ORAL | 1 refills | Status: DC

## 2020-12-06 MED ORDER — ATORVASTATIN CALCIUM 40 MG PO TABS: ORAL_TABLET | ORAL | 2 refills | Status: DC

## 2020-12-06 MED ORDER — ZOSTER VAC RECOMB ADJUVANTED 50 MCG/0.5ML IM SUSR: 0.5000 mL | Freq: Once | INTRAMUSCULAR | 0 refills | Status: AC

## 2020-12-06 NOTE — Patient Instructions (Signed)
Diabetes Mellitus and Nutrition, Adult When you have diabetes, or diabetes mellitus, it is very important to have healthy eating habits because your blood sugar (glucose) levels are greatly affected by what you eat and drink. Eating healthy foods in the right amounts, at about the same times every day, can help you:  Control your blood glucose.  Lower your risk of heart disease.  Improve your blood pressure.  Reach or maintain a healthy weight. What can affect my meal plan? Every person with diabetes is different, and each person has different needs for a meal plan. Your health care provider may recommend that you work with a dietitian to make a meal plan that is best for you. Your meal plan may vary depending on factors such as:  The calories you need.  The medicines you take.  Your weight.  Your blood glucose, blood pressure, and cholesterol levels.  Your activity level.  Other health conditions you have, such as heart or kidney disease. How do carbohydrates affect me? Carbohydrates, also called carbs, affect your blood glucose level more than any other type of food. Eating carbs naturally raises the amount of glucose in your blood. Carb counting is a method for keeping track of how many carbs you eat. Counting carbs is important to keep your blood glucose at a healthy level, especially if you use insulin or take certain oral diabetes medicines. It is important to know how many carbs you can safely have in each meal. This is different for every person. Your dietitian can help you calculate how many carbs you should have at each meal and for each snack. How does alcohol affect me? Alcohol can cause a sudden decrease in blood glucose (hypoglycemia), especially if you use insulin or take certain oral diabetes medicines. Hypoglycemia can be a life-threatening condition. Symptoms of hypoglycemia, such as sleepiness, dizziness, and confusion, are similar to symptoms of having too much  alcohol.  Do not drink alcohol if: ? Your health care provider tells you not to drink. ? You are pregnant, may be pregnant, or are planning to become pregnant.  If you drink alcohol: ? Do not drink on an empty stomach. ? Limit how much you use to:  0-1 drink a day for women.  0-2 drinks a day for men. ? Be aware of how much alcohol is in your drink. In the [place] one drink equals one 12 oz bottle of beer (355 mL), one 5 oz glass of wine (148 mL), or one 1 oz glass of hard liquor (44 mL). ? Keep yourself hydrated with water, diet soda, or unsweetened iced tea.  Keep in mind that regular soda, juice, and other mixers may contain a lot of sugar and must be counted as carbs. What are tips for following this plan? Reading food labels  Start by checking the serving size on the "Nutrition Facts" label of packaged foods and drinks. The amount of calories, carbs, fats, and other nutrients listed on the label is based on one serving of the item. Many items contain more than one serving per package.  Check the total grams (g) of carbs in one serving. You can calculate the number of servings of carbs in one serving by dividing the total carbs by 15. For example, if a food has 30 g of total carbs per serving, it would be equal to 2 servings of carbs.  Check the number of grams (g) of saturated fats and trans fats in one serving. Choose foods that have   a low amount or none of these fats.  Check the number of milligrams (mg) of salt (sodium) in one serving. Most people should limit total sodium intake to less than 2,300 mg per day.  Always check the nutrition information of foods labeled as "low-fat" or "nonfat." These foods may be higher in added sugar or refined carbs and should be avoided.  Talk to your dietitian to identify your daily goals for nutrients listed on the label. Shopping  Avoid buying canned, pre-made, or processed foods. These foods tend to be high in fat, sodium, and added  sugar.  Shop around the outside edge of the grocery store. This is where you will most often find fresh fruits and vegetables, bulk grains, fresh meats, and fresh dairy. Cooking  Use low-heat cooking methods, such as baking, instead of high-heat cooking methods like deep frying.  Cook using healthy oils, such as olive, canola, or sunflower oil.  Avoid cooking with butter, cream, or high-fat meats. Meal planning  Eat meals and snacks regularly, preferably at the same times every day. Avoid going long periods of time without eating.  Eat foods that are high in fiber, such as fresh fruits, vegetables, beans, and whole grains. Talk with your dietitian about how many servings of carbs you can eat at each meal.  Eat 4-6 oz (112-168 g) of lean protein each day, such as lean meat, chicken, fish, eggs, or tofu. One ounce (oz) of lean protein is equal to: ? 1 oz (28 g) of meat, chicken, or fish. ? 1 egg. ?  cup (62 g) of tofu.  Eat some foods each day that contain healthy fats, such as avocado, nuts, seeds, and fish.   What foods should I eat? Fruits Berries. Apples. Oranges. Peaches. Apricots. Plums. Grapes. Mango. Papaya. Pomegranate. Kiwi. Cherries. Vegetables Lettuce. Spinach. Leafy greens, including kale, chard, collard greens, and mustard greens. Beets. Cauliflower. Cabbage. Broccoli. Carrots. Green beans. Tomatoes. Peppers. Onions. Cucumbers. Brussels sprouts. Grains Whole grains, such as whole-wheat or whole-grain bread, crackers, tortillas, cereal, and pasta. Unsweetened oatmeal. Quinoa. Brown or wild rice. Meats and other proteins Seafood. Poultry without skin. Lean cuts of poultry and beef. Tofu. Nuts. Seeds. Dairy Low-fat or fat-free dairy products such as milk, yogurt, and cheese. The items listed above may not be a complete list of foods and beverages you can eat. Contact a dietitian for more information. What foods should I avoid? Fruits Fruits canned with  syrup. Vegetables Canned vegetables. Frozen vegetables with butter or cream sauce. Grains Refined white flour and flour products such as bread, pasta, snack foods, and cereals. Avoid all processed foods. Meats and other proteins Fatty cuts of meat. Poultry with skin. Breaded or fried meats. Processed meat. Avoid saturated fats. Dairy Full-fat yogurt, cheese, or milk. Beverages Sweetened drinks, such as soda or iced tea. The items listed above may not be a complete list of foods and beverages you should avoid. Contact a dietitian for more information. Questions to ask a health care provider  Do I need to meet with a diabetes educator?  Do I need to meet with a dietitian?  What number can I call if I have questions?  When are the best times to check my blood glucose? Where to find more information:  American Diabetes Association: diabetes.org  Academy of Nutrition and Dietetics: www.eatright.org  National Institute of Diabetes and Digestive and Kidney Diseases: www.niddk.nih.gov  Association of Diabetes Care and Education Specialists: www.diabeteseducator.org Summary  It is important to have healthy eating   habits because your blood sugar (glucose) levels are greatly affected by what you eat and drink.  A healthy meal plan will help you control your blood glucose and maintain a healthy lifestyle.  Your health care provider may recommend that you work with a dietitian to make a meal plan that is best for you.  Keep in mind that carbohydrates (carbs) and alcohol have immediate effects on your blood glucose levels. It is important to count carbs and to use alcohol carefully. This information is not intended to replace advice given to you by your health care provider. Make sure you discuss any questions you have with your health care provider. Document Revised: 02/09/2019 Document Reviewed: 02/09/2019 Elsevier Patient Education  2021 Elsevier Inc.  

## 2020-12-06 NOTE — Progress Notes (Signed)
I,YAMILKA J Llittleton,acting as a Education administrator for [name], [name].,have documented all relevant documentation on the behalf of [name], [name],as directed by  [name], [name] while in the presence of [name], [place].   This visit occurred during the SARS-CoV-2 public health emergency.  Safety protocols were in place, including screening questions prior to the visit, additional usage of staff PPE, and extensive cleaning of exam room while observing appropriate contact time as indicated for disinfecting solutions.  Subjective:     Patient ID: [name] , male    DOB: 07-Jul-1955 , 65 y.o.   MRN: [id number]   Chief Complaint  Patient presents with   Diabetes   Hypertension    HPI  Patient here for a f/u on his diabetes and blood pressure.  States currently feels good since he was able to see his lady friend. "As long as I see terena I do good."  "I have been trying to find a part time job due to getting behind on his bills."    He is seeing a Dealer in [employer] - and he was told by them he could take 5 viagra.   Diabetes He presents for his follow-up diabetic visit. He has type 2 diabetes mellitus. His disease course has been stable. There are no hypoglycemic associated symptoms. Pertinent negatives for hypoglycemia include no dizziness, headaches or nervousness/anxiousness. Pertinent negatives for diabetes include no chest pain, no fatigue, no polydipsia, no polyphagia and no polyuria. There are no hypoglycemic complications. Symptoms are worsening. There are no diabetic complications. Risk factors for coronary artery disease include obesity, male sex, sedentary lifestyle and diabetes mellitus. Current diabetic treatment includes oral agent (dual therapy) (tolerating Rybelsus and Tradjenta well. ). He is compliant with treatment all of the time. He is following a generally unhealthy diet. When asked about meal planning, he reported none. He has not had a previous visit with a  dietitian. He rarely participates in exercise. His home blood glucose trend is decreasing steadily. (Last time he checked his blood sugar was 140 has been up to 180 a few times ) An ACE inhibitor/angiotensin II receptor blocker is being taken. He does not see a podiatrist.Eye exam is not current.  Hypertension This is a chronic problem. The current episode started more than 1 year ago. The problem is controlled. Pertinent negatives include no chest pain, headaches or palpitations.    Past Medical History:  Diagnosis Date   Acute kidney injury superimposed on chronic kidney disease ([place]) 02/10/2020   Acute on chronic kidney failure ([place]) 09/28/2019   Closed nondisplaced spiral fracture of shaft of right tibia    Complicated urinary tract infection 09/06/2016   Depression    Diabetes mellitus without complication (HCC)    GERD (gastroesophageal reflux disease)    Gout    right foot   Hemorrhoids    Hypertension    Hypertension    Macular degeneration    Microcytic anemia    Prostate cancer ([place]) 08/2009   Rectal bleeding 07/31/2016   Tubular adenoma      Family History  Problem Relation Age of Onset   Lung cancer Mother        Deceased   Throat cancer Brother    Pancreatic cancer Father    Heart disease Father        Deceased   Lung cancer Maternal Uncle        nephew   Diabetes Other      Current Outpatient Medications:  acetaminophen (TYLENOL) 325 MG tablet, Take 2 tablets (650 mg total) by mouth every 6 (six) hours as needed for mild pain (or Fever >/= 101)., Disp: , Rfl:    alfuzosin (UROXATRAL) 10 MG 24 hr tablet, Take 1 tablet (10 mg total) by mouth daily with breakfast., Disp: 30 tablet, Rfl: 6   ARIPiprazole (ABILIFY) 2 MG tablet, Take 1 tablet (2 mg total) by mouth daily., Disp: 30 tablet, Rfl: 0   aspirin EC 81 MG tablet, Take 81 mg by mouth daily with breakfast. , Disp: , Rfl:    clotrimazole-betamethasone (LOTRISONE) cream, Apply 1 application topically 2 (two)  times daily., Disp: 30 g, Rfl: 2   diclofenac Sodium (VOLTAREN) 1 % GEL, Apply 4 g topically 4 (four) times daily., Disp: 100 g, Rfl: 0   nystatin (NYSTATIN) powder, Apply 1 application topically 3 (three) times daily., Disp: 15 g, Rfl: 0   olmesartan-hydrochlorothiazide (BENICAR HCT) 40-25 MG tablet, Take 1 tablet by mouth daily., Disp: 90 tablet, Rfl: 1   Semaglutide (RYBELSUS) 14 MG TABS, Take 1 tablet by mouth daily. Take 30 minutes before breakfast, Disp: 90 tablet, Rfl: 1   sildenafil (REVATIO) 20 MG tablet, Take 20-100 mg by mouth daily as needed for erectile dysfunction., Disp: , Rfl:    Tetrahydrozoline HCl (VISINE OP), Place 1 drop into both eyes daily as needed (itching/irritation)., Disp: , Rfl:    traMADol (ULTRAM) 50 MG tablet, Take 1 tablet (50 mg total) by mouth every 6 (six) hours as needed for severe pain., Disp: 10 tablet, Rfl: 0   traZODone (DESYREL) 50 MG tablet, TAKE 1 TABLET (50 MG TOTAL) BY MOUTH AT BEDTIME AS NEEDED FOR SLEEP., Disp: 90 tablet, Rfl: 1   atorvastatin (LIPITOR) 40 MG tablet, 1 tablet by mouth Monday - Friday, Disp: 30 tablet, Rfl: 2   metoprolol tartrate (LOPRESSOR) 50 MG tablet, Take 1 tablet (50 mg total) by mouth 2 (two) times daily., Disp: , Rfl:    No Known Allergies   Review of Systems  Constitutional:  Negative for fatigue.  Cardiovascular:  Negative for chest pain, palpitations and leg swelling.  Endocrine: Negative for polydipsia, polyphagia and polyuria.  Neurological:  Negative for dizziness and headaches.  Psychiatric/Behavioral:  Positive for suicidal ideas (no plan, states "if terena was not in my life I don't think I could go on anymore, I would take it really hard"). The patient is not nervous/anxious.     Today's Vitals   12/06/20 1443  BP: 114/68  Pulse: 100  Temp: 99.2 F (37.3 C)  Weight: 178 lb 9.6 oz (81 kg)  Height: 5' 3.2" (1.605 m)   Body mass index is 31.44 kg/m.   Objective:  Physical Exam Vitals reviewed.   Constitutional:      General: He is not in acute distress.    Appearance: Normal appearance. He is obese.  Cardiovascular:     Rate and Rhythm: Normal rate and regular rhythm.     Pulses: Normal pulses.     Heart sounds: Normal heart sounds. No murmur heard. Pulmonary:     Effort: Pulmonary effort is normal. No respiratory distress.     Breath sounds: Normal breath sounds. No wheezing.  Skin:    General: Skin is warm and dry.     Capillary Refill: Capillary refill takes less than 2 seconds.     Coloration: Skin is not jaundiced.  Neurological:     General: No focal deficit present.     Mental Status: He is  alert and oriented to person, place, and time.     Cranial Nerves: No cranial nerve deficit.     Motor: No weakness.  Psychiatric:        Mood and Affect: Mood is not anxious or depressed (at times). Affect is flat. Affect is not tearful.        Behavior: Behavior normal.        Thought Content: Thought content normal.        Judgment: Judgment normal.        Assessment And Plan:     1. Type 2 diabetes mellitus with diabetic nephropathy, without long-term current use of insulin (HCC) Comments: He is to only take the Rybelsus and not Tradjenta, pending his HgbA1c we may need to add another medication - atorvastatin (LIPITOR) 40 MG tablet; 1 tablet by mouth Monday - Friday  Dispense: 30 tablet; Refill: 2 - Semaglutide (RYBELSUS) 14 MG TABS; Take 1 tablet by mouth daily. Take 30 minutes before breakfast  Dispense: 90 tablet; Refill: 1 - BMP8+eGFR - Hemoglobin A1c - Lipid panel  2. Essential hypertension Comments: Blood pressure is well controlled Continue current medications - metoprolol tartrate (LOPRESSOR) 50 MG tablet; Take 1 tablet (50 mg total) by mouth 2 (two) times daily. - BMP8+eGFR  3. Moderate episode of recurrent major depressive disorder (HCC) He has had several episodes of stating he would like to kill himself and has spoken to mobile crisis. I will start him  on abilify since he is having mood swings and some depression. I will also refer him to psychiatry and counseling. Denies suicidal or homicidal ideations however he did say if he does not speak with his lady friend he does not want to be here, he does not have a plan  - Ambulatory referral to Psychiatry - Ambulatory referral to Psychology - ARIPiprazole (ABILIFY) 2 MG tablet; Take 1 tablet (2 mg total) by mouth daily.  Dispense: 30 tablet; Refill: 0  4. Mood changes - ARIPiprazole (ABILIFY) 2 MG tablet; Take 1 tablet (2 mg total) by mouth daily.  Dispense: 30 tablet; Refill: 0  5. Encounter for screening colonoscopy According to USPTF Colorectal cancer Screening guidelines. Colonoscopy is recommended every 10 years, starting at age 65years. Will refer to GI for colon cancer screening. - Ambulatory referral to Gastroenterology  6. Encounter for immunization - Zoster Vaccine Adjuvanted [facility]) injection; Inject 0.5 mLs into the muscle once for 1 dose.  Dispense: 0.5 mL; Refill: 0 - Tdap (BOOSTRIX) 5-2.5-18.5 LF-MCG/0.5 injection; Inject 0.5 mLs into the muscle once for 1 dose.  Dispense: 0.5 mL; Refill: 0  7. Influenza vaccination declined  Patient declined influenza vaccination at this time. Patient is aware that influenza vaccine prevents illness in 70% of healthy people, and reduces hospitalizations to 30-70% in elderly. This vaccine is recommended annually. Pt is willing to accept risk associated with refusing vaccination.    Patient was given opportunity to ask questions. Patient verbalized understanding of the plan and was able to repeat key elements of the plan. All questions were answered to their satisfaction.  [name], [name]   I, [name], [name], have reviewed all documentation for this visit. The documentation on 12/06/20 for the exam, diagnosis, procedures, and orders are all accurate and complete.   IF YOU HAVE BEEN REFERRED TO A SPECIALIST, IT MAY TAKE 1-2 WEEKS TO  SCHEDULE/PROCESS THE REFERRAL. IF YOU HAVE NOT HEARD FROM US/SPECIALIST IN TWO WEEKS, PLEASE GIVE [place] A CALL AT [phone] X [phone].  THE PATIENT IS ENCOURAGED TO PRACTICE SOCIAL DISTANCING DUE TO THE COVID-19 PANDEMIC.

## 2020-12-07 ENCOUNTER — Telehealth: Payer: Self-pay

## 2020-12-07 ENCOUNTER — Telehealth: Payer: Medicare Other

## 2020-12-07 LAB — BMP8+EGFR
BUN/Creatinine Ratio: 27 — ABNORMAL HIGH (ref 10–24)
BUN: 45 mg/dL — ABNORMAL HIGH (ref 8–27)
CO2: 20 mmol/L (ref 20–29)
Calcium: 9.7 mg/dL (ref 8.6–10.2)
Chloride: 99 mmol/L (ref 96–106)
Creatinine, Ser: 1.67 mg/dL — ABNORMAL HIGH (ref 0.76–1.27)
Glucose: 265 mg/dL — ABNORMAL HIGH (ref 65–99)
Potassium: 4.5 mmol/L (ref 3.5–5.2)
Sodium: 139 mmol/L (ref 134–144)
eGFR: 45 mL/min/{1.73_m2} — ABNORMAL LOW (ref >59)

## 2020-12-07 LAB — LIPID PANEL
Chol/HDL Ratio: 4.9 ratio (ref 0.0–5.0)
Cholesterol, Total: 192 mg/dL (ref 100–199)
HDL: 39 mg/dL — ABNORMAL LOW (ref >39)
LDL Chol Calc (NIH): 115 mg/dL — ABNORMAL HIGH (ref 0–99)
Triglycerides: 218 mg/dL — ABNORMAL HIGH (ref 0–149)
VLDL Cholesterol Cal: 38 mg/dL (ref 5–40)

## 2020-12-07 LAB — HEMOGLOBIN A1C
Est. average glucose Bld gHb Est-mCnc: 194 mg/dL
Hgb A1c MFr Bld: 8.4 % — ABNORMAL HIGH (ref 4.8–5.6)

## 2020-12-07 NOTE — Chronic Care Management (AMB) (Signed)
    Chronic Care Management Pharmacy Assistant   Name: [name]  MRN: [id number] DOB: 02/16/1956  12/06/2020- The [employer] pharmacogenomics process and benefits of testing was discussed with the patient. After the discussion, the patient made an informed consent to testing and the sample was collected and mailed to the external lab.  [name], [place] Pharmacist Assistant [phone]

## 2020-12-07 NOTE — Telephone Encounter (Signed)
  Care Management   Follow Up Note   12/07/2020 Name: [name] MRN: [id number] DOB: 08-10-1955   Referred by: [name], [name] Reason for referral : Chronic Care Management (Unsuccessful call)   An unsuccessful telephone outreach was attempted today. The patient was referred to the case management team for assistance with care management and care coordination. SW unable to leave a voice message due to the patients voice mailbox not being set-up.  Follow Up Plan: The care management team will reach out to the patient again over the next 10 days.   [name], BSW, CDP Social Worker, Certified Dementia Practitioner [place] [facility] [place] [facility] [phone]

## 2020-12-07 NOTE — Telephone Encounter (Signed)
Thank you [name] he has been seen in the office. This week

## 2020-12-10 ENCOUNTER — Ambulatory Visit (HOSPITAL_COMMUNITY)
Admission: EM | Admit: 2020-12-10 | Discharge: 2020-12-10 | Disposition: A | Discharge status: Home / Self Care | Payer: Medicare Other | Attending: Urology | Admitting: Urology

## 2020-12-10 ENCOUNTER — Other Ambulatory Visit: Payer: Self-pay

## 2020-12-10 DIAGNOSIS — F32A Depression, unspecified: Secondary | ICD-10-CM | POA: Diagnosis present

## 2020-12-10 DIAGNOSIS — F330 Major depressive disorder, recurrent, mild: Secondary | ICD-10-CM | POA: Diagnosis not present

## 2020-12-10 NOTE — BH Assessment (Addendum)
Comprehensive Clinical Assessment (CCA) Note  12/10/2020 [name] [id number]  Disposition: [name], NP recommends pt does not meet inpatient treatment criteria and to follow up with Outpatient resources. Resources were provided.   [name] from 12/10/2020 in [facility] from 11/11/2020 in [place] [facility] to Hosp-Admission (Discharged) from 10/25/2020 in [facility] EAST MEDICAL UNIT  C-SSRS RISK CATEGORY No Risk No Risk No Risk      The patient demonstrates the following risk factors for suicide: Chronic risk factors for suicide include: psychiatric disorder of Adjustment Disorder with mixed Anxiety and Depressed mood  . Acute risk factors for suicide include: N/A. Protective factors for this patient include: positive social support. Considering these factors, the overall suicide risk at this point appears to be no risk. Patient is not appropriate for outpatient follow up.  [name] is a 65 year old male who presents voluntary unaccompanied to [place]. Pt began the assessment asking the possible outcomes. Clinician explained the assessment process, the three possible dispositions however pt continued to ask if he could leave even after seeing the psychiatric provider. Clinician asked the pt, "what brought you to the hospital?" Per pt, he's depressed, his friend is upset with him. Per pt, his friend went out and partied on Friday, he called her numerous times she blocked him. Pt reports, he then call his friends phone from other people numbers. Per pt, his friend did not like the fact that he continued calling her from other people phone. Pt reports, a while ago his friend got upset with him and did not want to talk he went over her house to talk, a neighbor called the police and he was told he could not come on her neighborhood for a while. Pt reports, his friend still met with him, he did not go  over her house to try to hurt her he just wanted to talk to her. Pt also reports, finances is a stressor. Pt reports, if he can talk to her he'll feel better. Clinician expressed he should respect his friend space because at this point she does not want to talk to him. Pt denies, SI, HI, AVH, self-injurious behaviors and access to weapons.   Pt denies, substance use. Pt's denies, being linked to OPT resources (medication management and/or counseling.)   Pt presents anxious he asked manu questions over and over ask it relates to if he can leave and the possible recommendations. Pt's mood, affect was anxious, depressed. Pt's insight and judgement was fair.   Diagnosis: Adjustment Disorder with mixed Anxiety and Depressed mood.   *Initially, pt consented for to speak to [name], pt then declined because she may say some thing's that are not true.*   Chief Complaint: No chief complaint on file.  Visit Diagnosis:     CCA Screening, Triage and Referral (STR)  Patient Reported Information How did you hear about [place]? Legal System  What Is the Reason for Your Visit/Call Today? Pt brought in by [employer], per pt he's been depressed lately. Pt reports, he's been having problems with his friend [name]). Per pt, on Friday, his friend went out and partied he called her too much so she blocked him however the pt called her from other numbers which upset her. Per pt, his friend wants space. Pt also reports financial problems.  How Long Has This Been Causing You Problems? 1 wk - 1 month  What  Do You Feel Would Help You the Most Today? Treatment for Depression or other mood problem   Have You Recently Had Any Thoughts About Hurting Yourself? No  Are You Planning to Commit Suicide/Harm Yourself At This time? No   Have you Recently Had Thoughts About [place]? No  Are You Planning to Harm Someone at This Time? No  Explanation: No data recorded  Have You Used Any Alcohol or  Drugs in the Past 24 Hours? No  How Long Ago Did You Use Drugs or Alcohol? No data recorded What Did You Use and How Much? No data recorded  Do You Currently Have a Therapist/Psychiatrist? No  Name of Therapist/Psychiatrist: No data recorded  Have You Been Recently Discharged From Any Office Practice or Programs? No  Explanation of Discharge From Practice/Program: No data recorded    CCA Screening Triage Referral Assessment Type of Contact: Face-to-Face  Telemedicine Service Delivery:   Is this Initial or Reassessment? Initial Assessment  Date Telepsych consult ordered in [facility]:  10/03/20  Time Telepsych consult ordered in [facility]:  1830  Location of Assessment: [facility]  Provider Location: [facility] Assessment Services   Collateral Involvement: Initally pt consented for clinician to call his friend then pt declined.   Does Patient Have a Stage manager Guardian? No data recorded Name and Contact of Legal Guardian: No data recorded If Minor and Not Living with Parent(s), Who has Custody? No data recorded Is CPS involved or ever been involved? Never  Is APS involved or ever been involved? Never   Patient Determined To Be At Risk for Harm To Self or Others Based on Review of Patient Reported Information or Presenting Complaint? No  Method: No data recorded Availability of Means: No data recorded Intent: No data recorded Notification Required: No data recorded Additional Information for Danger to Others Potential: No data recorded Additional Comments for Danger to Others Potential: No data recorded Are There Guns or Other Weapons in Your Home? No data recorded Types of Guns/Weapons: No data recorded Are These Weapons Safely Secured?                            No data recorded Who Could Verify You Are Able To Have These Secured: No data recorded Do You Have any Outstanding Charges, Pending Court Dates, Parole/Probation? No data recorded Contacted To  Inform of Risk of Harm To Self or Others: No data recorded   Does Patient Present under Involuntary Commitment? No  IVC Papers Initial File Date: No data recorded  [place] of Residence: [place]   Patient Currently Receiving the Following Services: Not Receiving Services   Determination of Need: Routine (7 days)   Options For Referral: Outpatient Therapy     CCA Biopsychosocial Patient Reported Schizophrenia/Schizoaffective Diagnosis in Past: No   Strengths: No data recorded  Mental Health Symptoms Depression:   Irritability; Difficulty Concentrating; Hopelessness; Tearfulness; Increase/decrease in appetite; Sleep (too much or little) (Lonely, lost.)   Duration of Depressive symptoms:    Mania:   None   Anxiety:    Worrying; Difficulty concentrating; Restlessness (pt states that his worries are situational)   Psychosis:   None   Duration of Psychotic symptoms:    Trauma:   None   Obsessions:   None   Compulsions:   None   Inattention:   None   Hyperactivity/Impulsivity:   None   Oppositional/Defiant Behaviors:   None  Emotional Irregularity:   Mood lability   Other Mood/Personality Symptoms:  No data recorded   Mental Status Exam Appearance and self-care  Stature:   Average   Weight:   Average weight   Clothing:   Neat/clean   Grooming:   Normal   Cosmetic use:   None   Posture/gait:   Normal   Motor activity:   Not Remarkable   Sensorium  Attention:   Distractible (INitially all patient wanted to do is cry and look at his photos of himself and girlfriend. Pt became more cooperative once assessment began)   Concentration:   Focuses on irrelevancies; Anxiety interferes   Orientation:   X5   Recall/memory:   Normal   Affect and Mood  Affect:   Anxious; Depressed   Mood:   Anxious; Depressed   Relating  Eye contact:   Fleeting   Facial expression:   Depressed; Sad   Attitude toward examiner:    Cooperative   Thought and Language  Speech flow:  Garbled; Soft   Thought content:   Appropriate to Mood and Circumstances   Preoccupation:   Ruminations (girlfriend/breakup)   Hallucinations:   None   Organization:  No data recorded  [employer] of Knowledge:   Fair   Intelligence:   Average   Abstraction:   Normal   Judgement:   Fair   Art therapist:   Variable   Insight:   Fair   Decision Making:   Impulsive   Social Functioning  Social Maturity:   Impulsive   Social Judgement:   -- (UTA)   Stress  Stressors:   Relationship; Financial   Coping Ability:   Deficient supports   Skill Deficits:   None   Supports:   Support needed     Religion: Religion/Spirituality Are You A Religious Person?: No  Leisure/Recreation: Leisure / Recreation Do You Have Hobbies?: No  Exercise/Diet: Exercise/Diet Do You Exercise?: Yes What Type of Exercise Do You Do?: Other (Comment) (Pt goes to a fitness center.) Have You Gained or Lost A Significant Amount of Weight in the Past Six Months?: Yes-Lost Do You Follow a Special Diet?: No Do You Have Any Trouble Sleeping?: No (Pt reports, he takes Trazodone to sleep at night.)   CCA Employment/Education Employment/Work Situation: Employment / Work Situation Employment Situation: Retired Has Patient ever Been in Passenger transport manager?: No  Education: Education Is Patient Currently Attending School?: No Last Grade Completed: 10 Did You Nutritional therapist?: No   CCA Family/Childhood History Family and Relationship History: Family history Marital status: Single Does patient have children?: No  Childhood History:  Childhood History Did patient suffer any verbal/emotional/physical/sexual abuse as a child?: No Has patient ever been sexually abused/assaulted/raped as an adolescent or adult?: No Witnessed domestic violence?: No Has patient been affected by domestic violence as an adult?:   (NA)  Child/Adolescent Assessment:     CCA Substance Use Alcohol/Drug Use: Alcohol / Drug Use Pain Medications: See MAR Prescriptions: See MAR Over the Counter: See MAR History of alcohol / drug use?: No history of alcohol / drug abuse    ASAM's:  Six Dimensions of Multidimensional Assessment  Dimension 1:  Acute Intoxication and/or Withdrawal Potential:      Dimension 2:  Biomedical Conditions and Complications:      Dimension 3:  Emotional, Behavioral, or Cognitive Conditions and Complications:     Dimension 4:  Readiness to Change:     Dimension 5:  Relapse, Continued use, or Continued  Problem Potential:     Dimension 6:  Recovery/Living Environment:     ASAM Severity Score:    ASAM Recommended Level of Treatment:     Substance use Disorder (SUD)    Recommendations for Services/Supports/Treatments: Recommendations for Services/Supports/Treatments Recommendations For Services/Supports/Treatments: Individual Therapy  Discharge Disposition:    DSM5 Diagnoses: Patient Active Problem List   Diagnosis Date Noted   Fall 11/11/2020   Hypertensive urgency, malignant 10/25/2020   Acute urinary retention 10/25/2020   Adjustment disorder with mixed anxiety and depressed mood    Acute renal failure superimposed on stage 3a chronic kidney disease ([place]) 09/15/2020   Ataxia 08/01/2020   Dizziness 07/31/2020   Chest pain 07/31/2020   Hip pain 07/31/2020   AKI (acute kidney injury) ([place]) 02/11/2020   Hydroureteronephrosis 02/10/2020   Nausea and vomiting 02/10/2020   History of COVID-19 02/10/2020   Pneumonia due to COVID-19 virus 09/29/2019   Hyponatremia 09/28/2019   Sinus tachycardia 09/28/2019   Insomnia 08/26/2018   Nephropathy 03/26/2018   Urinary urgency 03/26/2018   Type 2 diabetes mellitus with diabetic nephropathy, without long-term current use of insulin ([place]) 03/26/2018   Essential hypertension 03/26/2018   Chronic kidney disease (CKD), stage III (moderate)  ([place]) 03/26/2018   Conductive hearing loss, bilateral 11/07/2017   Uncontrolled type 2 diabetes mellitus with hyperglycemia ([place]) 07/31/2016   Dyslipidemia associated with type 2 diabetes mellitus ([place]) 05/29/2016   Microcytic anemia 02/17/2012   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  Visit Diagnosis:   Patient Reported Information How did you hear about [place]? Legal System  What Is the Reason for Your Visit/Call Today? Pt brought in by [employer], per pt he's been depressed lately. Pt reports, he's been having problems with his friend [name]). Per pt, on Friday, his friend went out and partied he called her too much so she blocked him however the pt called her from other numbers which upset her. Per pt, his friend wants space. Pt also reports financial problems.  How Long Has This Been Causing You Problems? 1 wk - 1 month  What Do You Feel Would Help You the Most Today? Treatment for Depression or other mood problem   Have You Recently Had Any Thoughts About Hurting Yourself? No  Are You Planning to Commit Suicide/Harm Yourself At This time? No   Have you Recently Had Thoughts About [place]? No  Are You Planning to Harm Someone at This Time? No  Explanation: No data recorded  Have You Used Any Alcohol or Drugs in the Past 24 Hours? No  How Long Ago Did You Use Drugs or Alcohol? No data recorded What Did You Use and How  Much? No data recorded  Do You Currently Have a Therapist/Psychiatrist? No  Name of Therapist/Psychiatrist: No data recorded  Have You Been Recently Discharged From Any Office Practice or Programs? No  Explanation of Discharge From Practice/Program: No data recorded   CCA Screening Triage Referral Assessment Type of Contact: Face-to-Face  Telemedicine Service Delivery:   Is this Initial or Reassessment? Initial Assessment  Date Telepsych consult ordered in [facility]:  10/03/20  Time Telepsych consult ordered in [facility]:  1830  Location of Assessment: [facility] Assessment Services  Provider Location: [facility] Assessment Services   Collateral Involvement: Initally pt consented for clinician to call his friend then pt declined.   Does Patient Have a Stage manager Guardian? No data recorded Name and Contact of Legal Guardian: No data recorded If Minor and Not Living with Parent(s), Who has Custody? No data recorded Is CPS involved or ever been involved? Never  Is APS involved or ever been involved? Never   Patient Determined To Be At Risk for Harm To Self or Others Based on Review of Patient Reported Information or Presenting Complaint? No  Method: No data recorded Availability of Means: No data recorded Intent: No data recorded Notification Required: No data recorded Additional Information for Danger to Others Potential: No data recorded Additional Comments for Danger to Others Potential: No data recorded Are There Guns or Other Weapons in Your Home? No data recorded Types of Guns/Weapons: No data recorded Are These Weapons Safely Secured?                            No data recorded Who Could Verify You Are Able To Have These Secured: No data recorded Do You Have any Outstanding Charges, Pending Court Dates, Parole/Probation? No data recorded Contacted To Inform of Risk of Harm To Self or Others: No data recorded  Does Patient Present under Involuntary Commitment? No  IVC  Papers Initial File Date: No data recorded  [place] of Residence: [place]   Patient Currently Receiving the Following Services: Not Receiving Services   Determination of Need: Routine (7 days)   Options For Referral: Outpatient Therapy   Discharge Disposition:     Vertell Novak, [place], [place], [facility], [facility] Triage Specialist [phone]

## 2020-12-10 NOTE — ED Notes (Signed)
[employer] Dis[employer] called to transport pt home.

## 2020-12-10 NOTE — Progress Notes (Signed)
   12/10/20 2314  Patient Reported Information  How Did You Hear About [place]? Legal System  What Is the Reason for Your Visit/Call Today? Pt brought in by [employer], per pt he's been depressed lately. Pt reports, he's been having problems with his friend [name]). Per pt, on Friday, his friend went out and partied he called her too much so she blocked him however the pt called her from other numbers which upset her. Per pt, his friend wants space. Pt also reports financial problems.  How Long Has This Been Causing You Problems? 1 wk - 1 month  What Do You Feel Would Help You the Most Today? Treatment for Depression or other mood problem  Have You Recently Had Any Thoughts About Hurting Yourself? No  Are You Planning to Commit Suicide/Harm Yourself At This time? No  Have you Recently Had Thoughts About [place]? No  Are You Planning To Harm Someone At This Time? No  Have You Used Any Alcohol or Drugs in the Past 24 Hours? No  Do You Currently Have a Therapist/Psychiatrist? No  CCA Screening Triage Referral Assessment  Type of Contact Face-to-Face  Location of Assessment [facility] Assessment Services  Provider location [facility] Assessment Services  Collateral Involvement Initally pt consented for clinician to call his friend then pt declined.  Does Patient Have a Stage manager Guardian? No  Patient Determined To Be At Risk for Harm To Self or Others Based on Review of Patient Reported Information or Presenting Complaint? No  Does Patient Present under Involuntary Commitment? No  [place] of Residence [place]  Patient Currently Receiving the Following Services: Not Receiving Services  Determination of Need Routine (7 days)  Options For Referral Outpatient Therapy    Determination of need: Routine.    Vertell Novak, [place], [facility], [facility] Triage Specialist [phone]

## 2020-12-10 NOTE — ED Provider Notes (Addendum)
Behavioral Health Urgent Care Medical Screening Exam  Patient Name: [name] MRN: [id number] Date of Evaluation: 12/10/20 Chief Complaint:   Diagnosis:  Final diagnoses:  MDD (major depressive disorder), recurrent episode, mild ([place])    History of Present illness: [name] is a 65 y.o. male with a past psychiatric history of depression and anxiety.  Patient presented to [facility] voluntarily via Event organiser.  Patient presented with a chief complaint of depression.  Patient was seen face-to-face and his chart was reviewed by this provider.  Patient is alert and oriented x4, calm and cooperative.  Patient is speaking in normal tone at moderate rate with good eye contact.  Patient describes his mood as depressed, his affect is congruent with his mood. his thoughts process is coherent.  He endorses depressive symptoms of occasional crying spells, irritability, fatigue, poor sleep, restlessness, hopelessness, and agitation. He denies all medical complaint including chest pain, shortness of breath, dizziness, headaches, visual changes, GI/GU complaints.  Patient reported that he contacted law enforcement due to feeling lonely and depressed. He reports that he has been feeling depressed over that pass several weeks; he says his depression was worsened by a recent argument with his best friend [place]. He reports Hortense Ramal is avoiding contact with him and avoiding his calls. Patient reports feeling sad and depressed due to [place] avoiding his calls.   Patient verbally contracted for safety. He said that he felt better after talking with TTS couselor and this Probation officer. He denies SI/HI/AVH/paranoia and substance abuse. Patient did not appear to be responding to any internal/external stimuli or experiencing delusional thought content during this assessment. He denies past history of suicidal attempt or self harming. He denies access to weapon or history of violence.    Psychiatric Specialty  Exam  Presentation  General Appearance:Appropriate for Environment  Eye Contact:Good  Speech:Clear and Coherent  Speech Volume:Normal  Handedness:Right   Mood and Affect  Mood:Depressed  Affect:Congruent   Thought Process  Thought Processes:Coherent  Descriptions of Associations:Intact  Orientation:Full (Time, Place and Person)  Thought Content:WDL  Diagnosis of Schizophrenia or Schizoaffective disorder in past: No   Hallucinations:None  Ideas of Reference:None  Suicidal Thoughts:No  Homicidal Thoughts:No   Sensorium  Memory:Immediate Good; Recent Good; Remote Fair  Judgment:Fair  Insight:Good   Executive Functions  Concentration:Good  Attention Span:Good  Recall:Good  Fund of Knowledge:Good  Language:Good   Psychomotor Activity  Psychomotor Activity:Normal   Assets  Assets:Desire for Improvement; Armed forces logistics/support/administrative officer; Financial Resources/Insurance; Physical Health; Transportation   Sleep  Sleep: No data recorded Number of hours:  No data recorded  No data recorded  Physical Exam: Physical Exam Vitals and nursing note reviewed.  Constitutional:      Appearance: He is well-developed. He is obese.  HENT:     Head: Normocephalic and atraumatic.  Eyes:     Conjunctiva/sclera: Conjunctivae normal.  Cardiovascular:     Rate and Rhythm: Normal rate.  Pulmonary:     Effort: Pulmonary effort is normal. No respiratory distress.     Breath sounds: Normal breath sounds.  Abdominal:     Palpations: Abdomen is soft.     Tenderness: There is no abdominal tenderness.  Musculoskeletal:        General: Normal range of motion.     Cervical back: Normal range of motion.  Skin:    General: Skin is dry.  Neurological:     Mental Status: He is alert and oriented to person, place, and time.  Psychiatric:  Attention and Perception: Attention and perception normal.        Mood and Affect: Mood is depressed.        Speech: Speech normal.         Behavior: Behavior normal. Behavior is cooperative.        Thought Content: Thought content normal. Thought content is not paranoid or delusional. Thought content does not include homicidal or suicidal ideation. Thought content does not include homicidal or suicidal plan.        Cognition and Memory: Cognition normal.   Review of Systems  Constitutional: Negative.   HENT: Negative.    Eyes: Negative.   Respiratory: Negative.    Cardiovascular: Negative.   Gastrointestinal: Negative.   Genitourinary: Negative.   Musculoskeletal: Negative.   Skin: Negative.   Neurological: Negative.   Endo/Heme/Allergies: Negative.   Psychiatric/Behavioral:  Positive for depression. Negative for hallucinations, memory loss, substance abuse and suicidal ideas. The patient is not nervous/anxious and does not have insomnia.   Blood pressure (!) 154/100, pulse 86, temperature 98.6 F (37 C), temperature source Oral, resp. rate 16, SpO2 98 %. There is no height or weight on file to calculate BMI.  Musculoskeletal: Strength & Muscle Tone: within normal limits Gait & Station: normal Patient leans: Right   [place] [facility] Discharge Disposition for Follow up and Recommendations: Based on my evaluation the patient does not appear to have an emergency medical condition and can be discharged with resources and follow up care in outpatient services for Individual Therapy  Discuss treatment options including medications and talk therapy for depression with patient. Patient decline medication at this time. Patient says he is interested in therapy. Outpatient resources provided to patient.  Open Access information included in AVS  Ahijah Devery A Yevette Knust, NP 12/10/2020, 10:56 PM

## 2020-12-10 NOTE — Discharge Instructions (Addendum)
You are encouraged to follow up with [facility] for outpatient treatment.  Walk in/ Open Access Hours: Monday - Friday 8AM - 11AM (To see provider and therapist) - Arrive around 7 or 7:15 to have a better chance of being seen, as slots fill up.   Friday - 1PM - 4PM (To see therapist only)  [facility] [address] [place], [place]   Discharge recommendations:  Patient is to take medications as prescribed. Please see information for follow-up appointment with psychiatry and therapy. Please follow up with your primary care provider for all medical related needs.   Therapy: We recommend that patient participate in individual therapy to address mental health concerns.  Medications: The parent/guardian is to contact a medical professional and/or outpatient provider to address any new side effects that develop. Parent/guardian should update outpatient providers of any new medications and/or medication changes.   Safety:  The patient should abstain from use of illicit substances/drugs and abuse of any medications. If symptoms worsen or do not continue to improve or if the patient becomes actively suicidal or homicidal then it is recommended that the patient return to the closest hospital emergency department, the [facility], or call 911 for further evaluation and treatment. National Suicide Prevention Lifeline 1-800-SUICIDE or (832)187-9871.  About 988 988 offers 24/7 access to trained crisis counselors who can help people experiencing mental health-related distress. People can call or text 988 or chat 988lifeline.org for themselves or if they are worried about a loved one who may need crisis support.

## 2020-12-11 NOTE — Chronic Care Management (AMB) (Signed)
Chronic Care Management   CCM RN Visit Note  12/05/2020 Name: PAXTEN APPELT MRN: [id number] DOB: [birth year]  Subjective: JAVAN GONZAGA is a 65 y.o. year old male who is a primary care patient of [name], [name]. The care management team was consulted for assistance with disease management and care coordination needs.    Engaged with patient by telephone for follow up visit in response to provider referral for case management and/or care coordination services.   Consent to Services:  The patient was given information about Chronic Care Management services, agreed to services, and gave verbal consent prior to initiation of services.  Please see initial visit note for detailed documentation.   Patient agreed to services and verbal consent obtained.   Assessment: Review of patient past medical history, allergies, medications, health status, including review of consultants reports, laboratory and other test data, was performed as part of comprehensive evaluation and provision of chronic care management services.   SDOH (Social Determinants of Health) assessments and interventions performed:    CCM Care Plan  No Known Allergies  Outpatient Encounter Medications as of 12/05/2020  Medication Sig Note   acetaminophen (TYLENOL) 325 MG tablet Take 2 tablets (650 mg total) by mouth every 6 (six) hours as needed for mild pain (or Fever >/= 101). 10/03/2020: As needed   alfuzosin (UROXATRAL) 10 MG 24 hr tablet Take 1 tablet (10 mg total) by mouth daily with breakfast.    aspirin EC 81 MG tablet Take 81 mg by mouth daily with breakfast.     clotrimazole-betamethasone (LOTRISONE) cream Apply 1 application topically 2 (two) times daily.    diclofenac Sodium (VOLTAREN) 1 % GEL Apply 4 g topically 4 (four) times daily.    nystatin (NYSTATIN) powder Apply 1 application topically 3 (three) times daily.    olmesartan-hydrochlorothiazide (BENICAR HCT) 40-25 MG tablet Take 1 tablet by mouth daily.     sildenafil (REVATIO) 20 MG tablet Take 20-100 mg by mouth daily as needed for erectile dysfunction.    Tetrahydrozoline HCl (VISINE OP) Place 1 drop into both eyes daily as needed (itching/irritation).    traMADol (ULTRAM) 50 MG tablet Take 1 tablet (50 mg total) by mouth every 6 (six) hours as needed for severe pain.    traZODone (DESYREL) 50 MG tablet TAKE 1 TABLET (50 MG TOTAL) BY MOUTH AT BEDTIME AS NEEDED FOR SLEEP.    [DISCONTINUED] atorvastatin (LIPITOR) 40 MG tablet 1 tablet by mouth Monday - Friday    [DISCONTINUED] metoprolol tartrate (LOPRESSOR) 50 MG tablet Take 1 tablet (50 mg total) by mouth 2 (two) times daily.    [DISCONTINUED] TRADJENTA 5 MG TABS tablet TAKE 1 TABLET BY MOUTH EVERY DAY    No facility-administered encounter medications on file as of 12/05/2020.    Patient Active Problem List   Diagnosis Date Noted   Fall 11/11/2020   Hypertensive urgency, malignant 10/25/2020   Acute urinary retention 10/25/2020   Adjustment disorder with mixed anxiety and depressed mood    Acute renal failure superimposed on stage 3a chronic kidney disease (HCC) 09/15/2020   Ataxia 08/01/2020   Dizziness 07/31/2020   Chest pain 07/31/2020   Hip pain 07/31/2020   AKI (acute kidney injury) (HCC) 02/11/2020   Hydroureteronephrosis 02/10/2020   Nausea and vomiting 02/10/2020   History of COVID-19 02/10/2020   Pneumonia due to COVID-19 virus 09/29/2019   Hyponatremia 09/28/2019   Sinus tachycardia 09/28/2019   Insomnia 08/26/2018   Nephropathy 03/26/2018   Urinary urgency 03/26/2018  Type 2 diabetes mellitus with diabetic nephropathy, without long-term current use of insulin ([place]) 03/26/2018   Essential hypertension 03/26/2018   Chronic kidney disease (CKD), stage III (moderate) ([place]) 03/26/2018   Conductive hearing loss, bilateral 11/07/2017   Uncontrolled type 2 diabetes mellitus with hyperglycemia ([place]) 07/31/2016   Dyslipidemia associated with type 2 diabetes mellitus ([place])  05/29/2016   Microcytic anemia 02/17/2012   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   Benign hypertension 02/17/2012   Depressive disorder 02/17/2012    Conditions to be addressed/monitored: CKD III, DM II, HTN, HLD, Depressive disorder, Prostate Cancer   Patient Care Plan: Chronic Kidney (Adult)     Problem Identified: Disease Progression   Priority: Medium     Long-Range Goal: Disease Progression Prevented or Minimized   Start Date: 04/06/2020  Expected End Date: 04/06/2021  Recent Progress: On track  Priority: Medium  Note:   Objective:  Lab Results  Component Value Date   HGBA1C 8.4 (H) 12/06/2020   Lab Results  Component Value Date   CREATININE 1.67 (H) 12/06/2020   CREATININE 1.82 (H) 11/11/2020   CREATININE 2.10 (H) 10/30/2020   Lab Results  Component Value Date   EGFR 45 (L) 12/06/2020  Current Barriers:  Ineffective Self Health Maintenance Currently UNABLE TO independently self manage needs related to chronic health conditions.  Knowledge Deficits related to short term plan for care coordination needs and long term plans for chronic disease management needs Does not adhere to prescribed treatment plan  Does not take medications as prescribed Does not keep MD doctor appointments as directed Does not follow dietary recommendations  Does not call physician for concerns  Case Manager Clinical Goal(s):  Collaboration with [name], [place] regarding development and update of comprehensive plan of care as evidenced by provider attestation and co-signature Inter-disciplinary care team collaboration (see longitudinal plan of care) Patient will work with care management team to address care coordination and chronic disease management needs related to Disease Management Educational Needs Care Coordination Medication Management and Education Psychosocial Support   Interventions:  12/05/20 completed successful outbound call with patient  Collaboration with [name]  [name] regarding development and update of comprehensive plan of care as evidenced by provider attestation and co-signature Inter-disciplinary care team collaboration (see longitudinal plan of care) Provided education to patient about basic disease process related to Chronic Kidney disease Review of patient status, including review of consultant's reports, relevant laboratory and other test results, and medications completed. Reviewed medications with patient and discussed importance of medication adherence Educated on importance of increasing water to 64 oz daily; Educated on the importance to keep BP under good control, discussed target BP <130/80; Educated on the importance to keep DM under good control  Technical sales engineer related to Eating Right with Chronic Kidney disease; 6 Ways to be Water Wise  Discussed plans with patient for ongoing care management follow up and provided patient with direct contact information for care management team Patient Goals:  -Increase daily water intake to 64 oz  -Call Nephrologist to schedule a follow up appointment                      Follow Up Plan: Telephone follow up appointment with care management team member scheduled for: 01/16/21    Patient Care Plan: Diabetes Type 2 (Adult)     Problem Identified: Disease Progression (Diabetes, Type 2)   Priority: Medium     Long-Range Goal: Disease Progression Prevented or Minimized   Start  Date: 04/06/2020  Expected End Date: 04/06/2021  This Visit's Progress: Not on track  Recent Progress: On track  Priority: Medium  Note:   Objective:  Lab Results  Component Value Date   HGBA1C 8.4 (H) 12/06/2020   Lab Results  Component Value Date   CREATININE 1.67 (H) 12/06/2020   CREATININE 1.82 (H) 11/11/2020   CREATININE 2.10 (H) 10/30/2020   Lab Results  Component Value Date   EGFR 45 (L) 12/06/2020   Current Barriers:  Knowledge Deficits related to basic Diabetes pathophysiology and  self care/management Knowledge Deficits related to medications used for management of diabetes Literacy barriers Does not use cbg meter  Limited Social Support Unable to self administer medications as prescribed Does not attend all scheduled provider appointments Does not adhere to prescribed medication regimen Does not adhere to prescribed psychotropic medication regimen Does not contact provider office for questions/concerns Case Manager Clinical Goal(s):  patient will demonstrate improved adherence to prescribed treatment plan for diabetes self care/management as evidenced by: daily monitoring and recording of CBG  adherence to ADA/ carb modified diet exercise 5 days/week adherence to prescribed medication regimen contacting provider for new or worsened symptoms or questions Interventions:  12/05/20 completed successful outbound call with patient  Collaboration with [name], [place] regarding development and update of comprehensive plan of care as evidenced by provider attestation and co-signature Inter-disciplinary care team collaboration (see longitudinal plan of care) Provided education to patient about basic DM disease process Review of patient status, including review of consultant's reports, relevant laboratory and other test results, and medications completed. Reviewed medications with patient and discussed importance of medication adherence Educated patient on dietary and exercise recommendations; daily glycemic control FBS 80-130, <180 after meals;15'15' rule Advised patient, providing education and rationale, to check cbg daily before meals and at bedtime and record, calling the CCM team and or PCP for findings outside established parameters Mailed printed educational materials related to Meal Planning; Diabetes Zone Management Tool; Preventing Complications from Diabetes; Diabetes Care Schedule Discussed plans with patient for ongoing care management follow up and provided  patient with direct contact information for care management team Self-Care Activities - Does not adhere to ADA/carb modified diet  Patient Goals: - check blood sugar at prescribed times - check blood sugar if I feel it is too high or too low - take the blood sugar log to all doctor visits - drink 6 to 8 glasses of water each day - fill half of plate with vegetables - manage portion size - keep appointment with eye doctor - schedule appointment with eye doctor - check feet daily for cuts, sores or redness - do heel pump exercise 2 to 3 times each day - keep feet up while sitting - trim toenails straight across - wash and dry feet carefully every day - wear comfortable, cotton socks - wear comfortable, well-fitting shoes  Follow Up Plan: Telephone follow up appointment with care management team member scheduled for: 01/16/21     Patient Care Plan: Hypertension (Adult)     Problem Identified: Disease Progression (Hypertension)   Priority: Medium     Long-Range Goal: Disease Progression Prevented or Minimized   Start Date: 04/06/2020  Expected End Date: 04/06/2021  Recent Progress: On track  Priority: Medium  Note:   Objective:  Last practice recorded BP readings:  BP Readings from Last 3 Encounters:  12/06/20 114/68  11/15/20 (!) 148/90  11/11/20 (!) 152/95   Most recent eGFR/CrCl:  Lab Results  Component Value  Date   EGFR 45 (L) 12/06/2020    No components found for: CRCL Current Barriers:  Knowledge Deficits related to basic understanding of hypertension pathophysiology and self care management Knowledge Deficits related to understanding of medications prescribed for management of hypertension Does not adhere to provider recommendations re:  Does not attend all scheduled provider appointments Does not adhere to prescribed medication regimen Does not adhere to prescribed psychotropic medication regimen Does not contact provider office for questions/concerns Case  Manager Clinical Goal(s):  patient will verbalize understanding of plan for hypertension management patient will demonstrate improved adherence to prescribed treatment plan for hypertension as evidenced by taking all medications as prescribed, monitoring and recording blood pressure as directed, adhering to low sodium/DASH diet Interventions:  12/05/20 completed unsuccessful outbound call with patient  Collaboration with [name], [place] regarding development and update of comprehensive plan of care as evidenced by provider attestation and co-signature Inter-disciplinary care team collaboration (see longitudinal plan of care) Evaluation of current treatment plan related to hypertension self management and patient's adherence to plan as established by provider. Provided education to patient re: stroke prevention, s/s of heart attack and stroke, DASH diet, complications of uncontrolled blood pressure Reviewed medications with patient and discussed importance of compliance Discussed plans with patient for ongoing care management follow up and provided patient with direct contact information for care management team Self-Care Activities: Self administers medications as prescribed Attends all scheduled provider appointments Calls provider office for new concerns, questions, or BP outside discussed parameters Checks BP and records as discussed Follows a low sodium diet/DASH diet Patient Goals: - learn about high blood pressure  Follow Up Plan: Telephone follow up appointment with care management team member scheduled for: 01/16/21     Patient Care Plan: Depression (Adult)     Problem Identified: Harm or Injury (Depression)   Priority: High     Long-Range Goal: Harm or Injury Prevented   Start Date: 10/31/2020  Expected End Date: 05/03/2021  This Visit's Progress: Not on track  Recent Progress: On track  Priority: High  Note:   Current Barriers:  Ineffective Self Health Maintenance in a  patient with CKD III, DM II, HTN, HLD Does not adhere to provider recommendations re: Psychiatric follow up for depression  Does not attend all scheduled provider appointments Does not adhere to prescribed medication regimen Does not adhere to prescribed psychotropic medication regimen Lacks social connections Does not maintain contact with provider office Does not contact provider office for questions/concerns Clinical Goal(s):  Collaboration with [name], [place] regarding development and update of comprehensive plan of care as evidenced by provider attestation and co-signature Inter-disciplinary care team collaboration (see longitudinal plan of care) patient will work with care management team to address care coordination and chronic disease management needs related to Disease Management Educational Needs Care Coordination Medication Management and Education Medication Reconciliation Psychosocial Support   Interventions:  12/05/20 completed successful outbound call with patient  Evaluation of current treatment plan related to CKD III, DM II, HTN, HLD, self-management and patient's adherence to plan as established by provider. Collaboration with [name], [name] regarding development and update of comprehensive plan of care as evidenced by provider attestation       and co-signature Inter-disciplinary care team collaboration (see longitudinal plan of care) Received securemail from [place] advising Mr. Nies missed several appointments with her for a home visit to evaluate for medical needs  Determined patient is not feeling suicidal today but admits he continues to have  severe depression  Discussed today patient was able to talk to his lady friend and this helped improve his depression Assessed for triggers that may exacerbate patient's depression, he advised "not getting to talk to my lady friend" Educated patient on the basic disease process for depression and treatment  recommendations per PCP Determined patient has tried pharmacological interventions in the past as well as counseling and stated "this does not help me" Determined he declines to retry medication and or counseling although he admits his "lady friend" is his only support person Provided active listening to patient and validated his feelings of hopelessness Attempted to have patient agree to verbal contract not to harm himself, he stated, "I don't feel that way today but I can't promise it won't happen" Discussed missed appointments with PCP and offered to schedule a new appointment in order for PCP to evaluate his depression and offer recommendations Scheduled patient to see PCP provider [name], [name] on 12/06/20 $RemoveBe'@3'[id number]$ :39 PM  Collaborated with embedded BSW [name] and PCP [name] [name] to advise of patient's ongoing severe depression and advised of his scheduled appointment Mailed printed educational material related to Mood and Health Zone Management Tool  Discussed plans with patient for ongoing care management follow up and provided patient with direct contact information for care management team Self Care Activities: - Keep PCP follow up appointment as directed  Patient Goals: - keep MD follow up appointment with PCP scheduled for tomorrow, 12/06/20 $RemoveBeforeDEI'@3'[id number]$ :15 PM  -use positive distractions to help with effective coping when feeling down, sad or hopeless -notify your healthcare team promptly for worsening depression and or suicidal ideation occurs  -use your Mood and Health Tool to help you determine when to call the doctor (will be mailed to your home address)  Follow Up Plan: Telephone follow up appointment with care management team member scheduled for: 12/18/20      Patient Care Plan: Malignant neoplasm of prostate     Problem Identified: Malignant neoplasm of prostate   Priority: High     Long-Range Goal: Malignant neoplasm of prostate complications prevented or minimized    Start Date: 12/05/2020  Expected End Date: 12/05/2021  This Visit's Progress: On track  Priority: High  Note:   Current Barriers:  Ineffective Self Health Maintenance in a patient with CKD III, DM II, HTN, HLD, Depressive disorder, Prostate Cancer   Does not attend all scheduled provider appointments Does not adhere to prescribed medication regimen Does not adhere to prescribed psychotropic medication regimen Lacks social connections Does not contact provider office for questions/concerns Clinical Goal(s):  Collaboration with [name], [place] regarding development and update of comprehensive plan of care as evidenced by provider attestation and co-signature Inter-disciplinary care team collaboration (see longitudinal plan of care) patient will work with care management team to address care coordination and chronic disease management needs related to Disease Management Educational Needs Care Coordination Medication Management and Education Medication Reconciliation Psychosocial Support   Interventions:  12/05/20 completed successful outbound call with patient  Evaluation of current treatment plan related to  Malignant neoplasm of prostate , self-management and patient's adherence to plan as established by provider. Collaboration with [name], [name] regarding development and update of comprehensive plan of care as evidenced by provider attestation       and co-signature Inter-disciplinary care team collaboration (see longitudinal plan of care) Determined patient completed a follow up with Dr. Raynelle Fanning MD with the [facility] on 11/30/20 Reviewed and discussed the following  Assessment/Plan:  HPI:  65 y.o. male with history of LUTS on alfuzosin, nephrolithiasis s/p right ureteroscopy complicated by right ureteral avulsion requiring exploratory laparotomy, ureteral reimplant with Boari flap in 2018 and Gleason 6 prostate cancer on active surveillance who presents  for follow up.  Last seen by Retia Passe, PA-C 11/03/2020 and treated for recurrent epididymitis with 2 weeks of Levaquin. At his last appointment with Dr. Felipa Eth 02/05/2020, patient was reluctant to decide on any curative treatment for prostate cancer.   Mag3 07/2019 showed R:L 12/88 % function. Further evaluation of right renal atrophy with a right nephrostomy tube followed by antegrade nephrostogram and possible repeat Lasix renogram recommended. He canceled the scheduled procedure.   Had recent hospitalization during which he had a Foley catheter placed, presumably for retention and failed a voiding trial for which the catheter was replaced. Unfortunately he removed the Foley catheter.   Saw Dr. Erlene Quan $RemoveBef'[facility]'HltufMaYtd$  [facility]. Alfuzosin was prescribed and cystoscopy offered for obstructive LUTS. 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 Options for treatment were discussed. Discussed active surveillance vs radiation therapy vs surgery. Discussed radiation therapy. This would consist of likely 2 years of androgen deprivation therapy with external beam radiation therapy to start after the ADT has started. The external beam therapy would be done through radiation oncology for 35-40 treatment sessions (weekdays). He would get Space Oar gel and fiducial seed placement prior to initiation of radiation therapy. Discussed surgery which would be robotic radical prostatectomy. Discussed radical prostatectomy with pelvic lymph node dissection in detail, including the risks of bleeding, infection, damage to surrounding structures, anesthetic risks, cardiovascular risks. We discussed that he would most likely need open approach given prior ex  lap also went over the side effects of incontinence (less common) and erectile dysfunction (common). Also discussed dry orgasm, lack of semen production, and inability to father children the normal way. Also went over postoperative course, need for Foley catheterization, and typical convalescence. We also had a long discussion about the options for erectile dysfunction after treatment of prostate cancer including PDE 5 inhibitors, ICI, Muse, penile prosthesis surgery. Will obtain repeat prostate MRI to assess for locally advanced disease, assess the extent of prostate cancer in the prostate, and also for pelvic lymphadenopathy given rising PSA Discussed options for management of LUTS including possible addition of finasteride as well as work-up with cystoscopy to evaluate for potential bladder outlet surgery. He elects to continue alfuzosin for now Continue alfuzosin F/u UCx Check Cr today We will hold off on repeating MAG3 scan or performing intervention for right renal atrophy given patient overall asymptomatic and not desiring any intervention. If creatinine worsening will reconsider. Refilled sildenafil as needed. Good Rx coupon provided We will hold off on antibiotics for possible chemical epididymitis pending urine culture results. Encourage conservative therapy with scrotal elevation and ice. RTC in 1 month to review MRI  PCP:  Kathreen Devoid, PA-C  CC: f/u prostate cancer  Review of patient status, including review of consultant's reports, relevant laboratory and other test results, and medications completed. Reviewed medications with patient and discussed importance of medication adherence Reviewed scheduled/upcoming provider appointments including: follow up with Dr. Hollice Espy MD at [facility] scheduled for 12/12/20 $RemoveBef'@11'TlvqWnwPNj$ :00 AM for a cystoscopy  Discussed plans  with patient for ongoing care management follow up and provided patient with direct contact information for  care management team Self Care Activities:  Patient verbalizes understanding of plan to follow up with Dr. Hollice Espy MD at [facility] on 12/12/20 $RemoveBe'@[email or web address]'[email or web address]$ :00 AM  Patient Goals: - keep scheduled appointment with Dr. Hollice Espy MD on 12/12/20 $RemoveBe'@[email or web address]$ :00 AM   Follow Up Plan: Telephone follow up appointment with care management team member scheduled for: 12/18/20     Plan:Telephone follow up appointment with care management team member scheduled for:  12/18/20  Barb Merino, RN, BSN, CCM Care Management Coordinator [place] [facility]  Direct Phone: [phone]

## 2020-12-11 NOTE — Progress Notes (Signed)
   12/12/2020  CC:  Chief Complaint  Patient presents with   Cysto    HPI: [name] is a 65 y.o. male with a personal history of prostate cancer, BPH with LUTS, history of ED and right ureteral calculus, and right renal atrophy, who presents today for a cystoscopy.   He has very extensive urological history. See previous note.  Since last visit, he is gone back to [facility] to see another urologist that he is previously never seen, Dr. Raynelle Fanning.  He reports that this was a "second opinion" but assures [place] that he would like to have his care primarily in [place].  He is undergoing cystoscopy today for his BPH with LUTS to evaluate prostate anatomy and bladder.   His most recent PSA on 11/15/2020 was 15.6.   He reports today that he still is having trouble obtaining an erection.   Vitals:   12/12/20 1055  BP: (!) 158/97  Pulse: (!) 101  NED. A&Ox3.   No respiratory distress   Abd soft, NT, ND Normal phallus with bilateral descended testicles  Cystoscopy Procedure Note  Patient identification was confirmed, informed consent was obtained, and patient was prepped using Betadine solution.  Lidocaine jelly was administered per urethral meatus.     Pre-Procedure: - Inspection reveals a normal caliber ureteral meatus.  Procedure: The flexible cystoscope was introduced without difficulty - No urethral strictures/lesions are present. - Enlarged prostate  - mildly elevated bladder neck - Bilateral ureteral orifices identified - Bladder mucosa  reveals no ulcers, tumors, or lesions - No bladder stones - moderate trabeculation - Trilobar coaptation of an enlarged prostate  -Diverticulum at the right dome of the bladder consistent with previous Boari flap and ureteral reimplant, able to advance scope to the level which appears to be the flap ureteral anastomosis but not beyond  Retroflexion shows unremarkable    Post-Procedure: - Patient tolerated the procedure  well  Assessment/ Plan:  1.  Prostate cancer  - PSA is rising agree with prostate MRI as recommended by his Lares. He does state he would like to continue pursuing care in [place]  -  prostate MRI; scheduled   2. BPH with outlet obstruction - Consistent with chronic outlet obstruction  - start on finasteride 5 mg to regimen  -Continue alpha-blocker -We will discuss option for outlet procedure pending MRI results/treatment plan for #1  3. Erectile dysfunction  - sildenafil with maximum therapy with little effect  - counseled him on taking this with a full stomach  - will consider injection in the future is symptoms persist   Follow-up after prostate MRI  I,[name],acting as a scribe for Hollice Espy, MD.,have documented all relevant documentation on the behalf of Hollice Espy, MD,as directed by  Hollice Espy, MD while in the presence of Hollice Espy, MD.  I have reviewed the above documentation for accuracy and completeness, and I agree with the above.   Hollice Espy, MD

## 2020-12-11 NOTE — Patient Instructions (Addendum)
Visit Information  PATIENT GOALS:  Goals Addressed      Harm or injury prevented   Not on track    Timeframe:  Short-Term Goal Priority:  High Start Date:  10/31/20                           Expected End Date:  05/03/21       Follow up date: 12/18/20      Patient Goals: - keep MD follow up appointment with PCP scheduled for tomorrow, 12/06/20 @3 :15 PM  -use positive distractions to help with effective coping when feeling down, sad or hopeless -notify your healthcare team promptly for worsening depression and or suicidal ideation occurs     -use your Mood and Health Zone Management Tool to help you determine when to call your doctor (will be mailed to your home address)          Malignant neoplasm of prostate complications prevented or minimized   On track    Timeframe:  Long-Range Goal Priority:  High Start Date:  12/05/20                           Expected End Date: 12/05/21     Follow up date: 12/18/20         Patient Goals: - keep scheduled appointment with Dr. Hollice Espy MD on 12/12/20 @11 :00 AM                 Monitor and Manage My Blood Sugar-Diabetes Type 2   Not on track    Timeframe:  Long-Range Goal Priority:  Medium Start Date:  04/06/20                          Expected End Date:  04/06/21                      Follow Up Date: 01/16/21  - check blood sugar at prescribed times - check blood sugar if I feel it is too high or too low - take the blood sugar log to all doctor visits - drink 6 to 8 glasses of water each day - fill half of plate with vegetables - manage portion size - keep appointment with eye doctor - schedule appointment with eye doctor - check feet daily for cuts, sores or redness - do heel pump exercise 2 to 3 times each day - keep feet up while sitting - trim toenails straight across - wash and dry feet carefully every day - wear comfortable, cotton socks - wear comfortable, well-fitting shoes   Why is this important?   Checking your blood  sugar at home helps to keep it from getting very high or very low.  Writing the results in a diary or log helps the doctor know how to care for you.  Your blood sugar log should have the time, date and the results.  Also, write down the amount of insulin or other medicine that you take.  Other information, like what you ate, exercise done and how you were feeling, will also be helpful.     Notes:      Prevent disease progression related to chronic kidney disease   On track    Timeframe:  Long-Range Goal Priority:  Medium Start Date: 04/06/20  Expected End Date:  04/06/21  Follow Up Date:  01/16/21  Patient Goals:  -Increase daily water intake to 64 oz  -Call Nephrologist to schedule a follow up appointment                           Track and Manage My Blood Pressure-Hypertension   On track    Timeframe:  Long-Range Goal Priority:  Medium Start Date: 04/06/20                           Expected End Date: 04/06/21                   Follow Up Date: 01/16/21   write blood pressure results in a log or diary  Self administers medications as prescribed Attends all scheduled provider appointments Calls provider office for new concerns, questions, or BP outside discussed parameters Checks BP and records as discussed Follows a low sodium diet/DASH diet   Why is this important?   You won't feel high blood pressure, but it can still hurt your blood vessels.  High blood pressure can cause heart or kidney problems. It can also cause a stroke.  Making lifestyle changes like losing a Keishawna Carranza weight or eating less salt will help.  Checking your blood pressure at home and at different times of the day can help to control blood pressure.  If the doctor prescribes medicine remember to take it the way the doctor ordered.  Call the office if you cannot afford the medicine or if there are questions about it.     Notes:         The patient verbalized understanding of  instructions, educational materials, and care plan provided today and declined offer to receive copy of patient instructions, educational materials, and care plan.   Telephone follow up appointment with care management team member scheduled for: 12/18/20  Barb Merino, RN, BSN, CCM Care Management Coordinator [place] [facility]  Direct Phone: [phone]

## 2020-12-12 ENCOUNTER — Other Ambulatory Visit: Payer: Self-pay

## 2020-12-12 ENCOUNTER — Encounter: Payer: Self-pay | Admitting: Urology

## 2020-12-12 ENCOUNTER — Telehealth: Payer: Medicare Other

## 2020-12-12 ENCOUNTER — Ambulatory Visit (INDEPENDENT_AMBULATORY_CARE_PROVIDER_SITE_OTHER): Payer: Medicare Other | Admitting: Urology

## 2020-12-12 ENCOUNTER — Ambulatory Visit: Payer: Self-pay

## 2020-12-12 VITALS — BP 158/97 | HR 101 | Ht 63.2 in | Wt 178.0 lb

## 2020-12-12 DIAGNOSIS — N1831 Chronic kidney disease, stage 3a: Secondary | ICD-10-CM

## 2020-12-12 DIAGNOSIS — R3915 Urgency of urination: Secondary | ICD-10-CM | POA: Diagnosis not present

## 2020-12-12 DIAGNOSIS — E1121 Type 2 diabetes mellitus with diabetic nephropathy: Secondary | ICD-10-CM

## 2020-12-12 DIAGNOSIS — F331 Major depressive disorder, recurrent, moderate: Secondary | ICD-10-CM

## 2020-12-12 DIAGNOSIS — I10 Essential (primary) hypertension: Secondary | ICD-10-CM

## 2020-12-12 DIAGNOSIS — C61 Malignant neoplasm of prostate: Secondary | ICD-10-CM

## 2020-12-12 DIAGNOSIS — F32A Depression, unspecified: Secondary | ICD-10-CM

## 2020-12-12 DIAGNOSIS — E1169 Type 2 diabetes mellitus with other specified complication: Secondary | ICD-10-CM

## 2020-12-12 DIAGNOSIS — F329 Major depressive disorder, single episode, unspecified: Secondary | ICD-10-CM

## 2020-12-12 DIAGNOSIS — E785 Hyperlipidemia, unspecified: Secondary | ICD-10-CM

## 2020-12-12 MED ORDER — FINASTERIDE 5 MG PO TABS: 5.0000 mg | ORAL_TABLET | Freq: Every day | ORAL | 0 refills | Status: DC

## 2020-12-12 NOTE — Chronic Care Management (AMB) (Signed)
Chronic Care Management   CCM [name] Visit Note  12/12/2020 Name: [name] MRN: [id number] DOB: 1955/11/30  Subjective: [name] is a 65 y.o. year old male who is a primary care patient of [name], Black. The care management team was consulted for assistance with disease management and care coordination needs.    Case Review  for  Care Coordination   in response to provider referral for case management and/or care coordination services.   Consent to Services:  The patient was given information about Chronic Care Management services, agreed to services, and gave verbal consent prior to initiation of services.  Please see initial visit note for detailed documentation.   Patient agreed to services and verbal consent obtained.   Assessment: Review of patient past medical history, allergies, medications, health status, including review of consultants reports, laboratory and other test data, was performed as part of comprehensive evaluation and provision of chronic care management services.   SDOH (Social Determinants of Health) assessments and interventions performed:    Randy Care Plan  No Known Allergies  Outpatient Encounter Medications as of 12/12/2020  Medication Sig Note   acetaminophen (TYLENOL) 325 MG tablet Take 2 tablets (650 mg total) by mouth every 6 (six) hours as needed for mild pain (or Fever >/= 101). 10/03/2020: As needed   alfuzosin (UROXATRAL) 10 MG 24 hr tablet Take 1 tablet (10 mg total) by mouth daily with breakfast.    ARIPiprazole (ABILIFY) 2 MG tablet Take 1 tablet (2 mg total) by mouth daily.    aspirin EC 81 MG tablet Take 81 mg by mouth daily with breakfast.     atorvastatin (LIPITOR) 40 MG tablet 1 tablet by mouth Monday - Friday    clotrimazole-betamethasone (LOTRISONE) cream Apply 1 application topically 2 (two) times daily.    diclofenac Sodium (VOLTAREN) 1 % GEL Apply 4 g topically 4 (four) times daily.    finasteride (PROSCAR) 5 MG tablet Take 1  tablet (5 mg total) by mouth daily.    metoprolol tartrate (LOPRESSOR) 50 MG tablet Take 1 tablet (50 mg total) by mouth 2 (two) times daily.    nystatin (NYSTATIN) powder Apply 1 application topically 3 (three) times daily.    olmesartan-hydrochlorothiazide (BENICAR HCT) 40-25 MG tablet Take 1 tablet by mouth daily.    Semaglutide (RYBELSUS) 14 MG TABS Take 1 tablet by mouth daily. Take 30 minutes before breakfast    sildenafil (REVATIO) 20 MG tablet Take by mouth.    Tetrahydrozoline HCl (VISINE OP) Place 1 drop into both eyes daily as needed (itching/irritation).    traMADol (ULTRAM) 50 MG tablet Take 1 tablet (50 mg total) by mouth every 6 (six) hours as needed for severe pain.    traZODone (DESYREL) 50 MG tablet TAKE 1 TABLET (50 MG TOTAL) BY MOUTH AT BEDTIME AS NEEDED FOR SLEEP.    No facility-administered encounter medications on file as of 12/12/2020.    Patient Active Problem List   Diagnosis Date Noted   Fall 11/11/2020   Hypertensive urgency, malignant 10/25/2020   Acute urinary retention 10/25/2020   Adjustment disorder with mixed anxiety and depressed mood    Acute renal failure superimposed on stage 3a chronic kidney disease ([place]) 09/15/2020   Ataxia 08/01/2020   Dizziness 07/31/2020   Chest pain 07/31/2020   Hip pain 07/31/2020   AKI (acute kidney injury) ([place]) 02/11/2020   Hydroureteronephrosis 02/10/2020   Nausea and vomiting 02/10/2020   History of COVID-19 02/10/2020   Pneumonia due  to COVID-19 virus 09/29/2019   Hyponatremia 09/28/2019   Sinus tachycardia 09/28/2019   Insomnia 08/26/2018   Nephropathy 03/26/2018   Urinary urgency 03/26/2018   Type 2 diabetes mellitus with diabetic nephropathy, without long-term current use of insulin ([place]) 03/26/2018   Essential hypertension 03/26/2018   Chronic kidney disease (CKD), stage III (moderate) ([place]) 03/26/2018   Conductive hearing loss, bilateral 11/07/2017   Uncontrolled type 2 diabetes mellitus with hyperglycemia  ([place]) 07/31/2016   Dyslipidemia associated with type 2 diabetes mellitus ([place]) 05/29/2016   Microcytic anemia 02/17/2012   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   Benign hypertension 02/17/2012   Depressive disorder 02/17/2012    Conditions to be addressed/monitored: CKD III, DM II, HTN, HLD, Depressive disorder, Prostate Cancer    Care Plan : Depression (Adult)  Updates made by [name], [name] since 12/12/2020 12:00 AM     Problem: Harm or Injury (Depression)   Priority: High     Long-Range Goal: Harm or Injury Prevented   Start Date: 10/31/2020  Expected End Date: 05/03/2021  This Visit's Progress: On track  Recent Progress: Not on track  Priority: High  Note:   Current Barriers:  Ineffective Self Health Maintenance in a patient with CKD III, DM II, HTN, HLD Does not adhere to provider recommendations re: Psychiatric follow up for depression  Does not attend all scheduled provider appointments Does not adhere to prescribed medication regimen Does not adhere to prescribed psychotropic medication regimen Lacks social connections Does not maintain contact with provider office Does not contact provider office for questions/concerns Clinical Goal(s):  Collaboration with [name], [name] regarding development and update of comprehensive plan of care as evidenced by provider attestation and co-signature Inter-disciplinary care team collaboration (see longitudinal plan of care) patient will work with care management team to address care coordination and chronic disease management needs related to Disease Management Educational Needs Care Coordination Medication Management and Education Medication Reconciliation Psychosocial Support   Interventions:  12/05/20 completed successful outbound call with patient  Evaluation of current treatment plan related to CKD III, DM II, HTN, HLD, self-management and patient's adherence to plan as established by provider. Collaboration with  [name], [name] regarding development and update of comprehensive plan of care as evidenced by provider attestation       and co-signature Inter-disciplinary care team collaboration (see longitudinal plan of care) Received securemail from [place] advising [name] missed several appointments with her for a home visit to evaluate for medical needs  Determined patient is not feeling suicidal today but admits he continues to have severe depression  Discussed today patient was able to talk to his lady friend and this helped improve his depression Assessed for triggers that may exacerbate patient's depression, he advised "not getting to talk to my lady friend" Educated patient on the basic disease process for depression and treatment recommendations per PCP Determined patient has tried pharmacological interventions in the past as well as counseling and stated "this does not help me" Determined he declines to retry medication and or counseling although he admits his "lady friend" is his only support person Provided active listening to patient and validated his feelings of hopelessness Attempted to have patient agree to verbal contract not to harm himself, he stated, "I don't feel that way today but I can't promise it won't happen" Discussed missed appointments with PCP and offered to schedule a new appointment in order for PCP to evaluate his depression and offer recommendations Scheduled patient to see PCP  provider [name], [name] on 12/06/20 @3 :15 PM  Collaborated with embedded BSW [name] and PCP [name] [name] to advise of patient's ongoing severe depression and advised of his scheduled appointment Mailed printed educational material related to Mood and Health Zone Management Tool  Discussed plans with patient for ongoing care management follow up and provided patient with direct contact information for care management team 12/12/20 Chart Review for recent encounter with [facility] completed on 12/10/20 [facility] Discharge Disposition for Follow up and Recommendations: Based on my evaluation the patient does not appear to have an emergency medical condition and can be discharged with resources and follow up care in outpatient services for Individual Therapy Discuss treatment options including medications and talk therapy for depression with patient. Patient decline medication at this time. Patient says he is interested in therapy. Outpatient resources provided to patient.  Open Access information included in AVS Randy Shoulder, NP 12/10/2020, 10:56 PM Sent secure message to PCP provider [name] [name] and embedded BSW [name] with a patient status update  Self Care Activities: - Keep PCP follow up appointment as directed  Patient Goals: - keep MD follow up appointment with PCP scheduled for tomorrow, 12/06/20 @3 :15 PM  -use positive distractions to help with effective coping when feeling down, sad or hopeless -notify your healthcare team promptly for worsening depression and or suicidal ideation occurs  -use your Mood and Health Zone Management Tool to help you determined when to call the doctor (will be mailed to your home address)  Follow Up Plan: Telephone follow up appointment with care management team member scheduled for: 12/18/20     Plan:Telephone follow up appointment with care management team member scheduled for:  12/18/20  Barb Merino, [name], [name], [name] [place] [facility]  Direct Phone: [phone]

## 2020-12-13 ENCOUNTER — Ambulatory Visit: Payer: Medicare Other

## 2020-12-13 DIAGNOSIS — E1121 Type 2 diabetes mellitus with diabetic nephropathy: Secondary | ICD-10-CM

## 2020-12-13 DIAGNOSIS — I10 Essential (primary) hypertension: Secondary | ICD-10-CM

## 2020-12-13 DIAGNOSIS — F331 Major depressive disorder, recurrent, moderate: Secondary | ICD-10-CM

## 2020-12-13 DIAGNOSIS — N1831 Chronic kidney disease, stage 3a: Secondary | ICD-10-CM

## 2020-12-13 LAB — URINALYSIS, COMPLETE
Bilirubin, UA: NEGATIVE
Ketones, UA: NEGATIVE
Leukocytes,UA: NEGATIVE
Nitrite, UA: NEGATIVE
RBC, UA: NEGATIVE
Specific Gravity, UA: 1.02 (ref 1.005–1.030)
Urobilinogen, Ur: 0.2 mg/dL (ref 0.2–1.0)
pH, UA: 5.5 (ref 5.0–7.5)

## 2020-12-13 LAB — MICROSCOPIC EXAMINATION: Bacteria, UA: NONE SEEN

## 2020-12-13 NOTE — Patient Instructions (Signed)
Social Worker Visit Information  Goals we discussed today:   Goals Addressed             This Visit's Progress    Patient Safety Concerns       Timeframe:  Short-Term Goal Priority:  High Start Date:  8.5.22                           Expected End Date: 9.4.22                        Patient Goals/Self-Care Activities patient will:  -Pick up Abilify and take as prescribed         The patient verbalized understanding of instructions, educational materials, and care plan provided today and declined offer to receive copy of patient instructions, educational materials, and care plan.   Follow Up Plan: SW will follow up with patient by phone over the next 10 days   [name], BSW, CDP Social Worker, Certified Dementia Practitioner [place] / [facility] [phone]

## 2020-12-13 NOTE — Chronic Care Management (AMB) (Signed)
Chronic Care Management    Social Work Note  12/13/2020 Name: [name] MRN: [id number] DOB: 08-19-1955  [name] is a 65 y.o. year old male who is a primary care patient of [name], [place]. The CCM team was consulted to assist the patient with chronic disease management and/or care coordination needs related to:  CKD III, DM II, HTN .   Engaged with patient by telephone for follow up visit in response to provider referral for social work chronic care management and care coordination services.   Consent to Services:  The patient was given information about Chronic Care Management services, agreed to services, and gave verbal consent prior to initiation of services.  Please see initial visit note for detailed documentation.   Patient agreed to services and consent obtained.   Assessment: Review of patient past medical history, allergies, medications, and health status, including review of relevant consultants reports was performed today as part of a comprehensive evaluation and provision of chronic care management and care coordination services.     SDOH (Social Determinants of Health) assessments and interventions performed:    Advanced Directives Status: Not addressed in this encounter.  CCM Care Plan  No Known Allergies  Outpatient Encounter Medications as of 12/13/2020  Medication Sig Note   acetaminophen (TYLENOL) 325 MG tablet Take 2 tablets (650 mg total) by mouth every 6 (six) hours as needed for mild pain (or Fever >/= 101). 10/03/2020: As needed   alfuzosin (UROXATRAL) 10 MG 24 hr tablet Take 1 tablet (10 mg total) by mouth daily with breakfast.    ARIPiprazole (ABILIFY) 2 MG tablet Take 1 tablet (2 mg total) by mouth daily.    aspirin EC 81 MG tablet Take 81 mg by mouth daily with breakfast.     atorvastatin (LIPITOR) 40 MG tablet 1 tablet by mouth Monday - Friday    clotrimazole-betamethasone (LOTRISONE) cream Apply 1 application topically 2 (two) times daily.     diclofenac Sodium (VOLTAREN) 1 % GEL Apply 4 g topically 4 (four) times daily.    finasteride (PROSCAR) 5 MG tablet Take 1 tablet (5 mg total) by mouth daily.    metoprolol tartrate (LOPRESSOR) 50 MG tablet Take 1 tablet (50 mg total) by mouth 2 (two) times daily.    nystatin (NYSTATIN) powder Apply 1 application topically 3 (three) times daily.    olmesartan-hydrochlorothiazide (BENICAR HCT) 40-25 MG tablet Take 1 tablet by mouth daily.    Semaglutide (RYBELSUS) 14 MG TABS Take 1 tablet by mouth daily. Take 30 minutes before breakfast    sildenafil (REVATIO) 20 MG tablet Take by mouth.    Tetrahydrozoline HCl (VISINE OP) Place 1 drop into both eyes daily as needed (itching/irritation).    traMADol (ULTRAM) 50 MG tablet Take 1 tablet (50 mg total) by mouth every 6 (six) hours as needed for severe pain.    traZODone (DESYREL) 50 MG tablet TAKE 1 TABLET (50 MG TOTAL) BY MOUTH AT BEDTIME AS NEEDED FOR SLEEP.    No facility-administered encounter medications on file as of 12/13/2020.    Patient Active Problem List   Diagnosis Date Noted   Fall 11/11/2020   Hypertensive urgency, malignant 10/25/2020   Acute urinary retention 10/25/2020   Adjustment disorder with mixed anxiety and depressed mood    Acute renal failure superimposed on stage 3a chronic kidney disease ([place]) 09/15/2020   Ataxia 08/01/2020   Dizziness 07/31/2020   Chest pain 07/31/2020   Hip pain 07/31/2020   AKI (acute  kidney injury) ([place]) 02/11/2020   Hydroureteronephrosis 02/10/2020   Nausea and vomiting 02/10/2020   History of COVID-19 02/10/2020   Pneumonia due to COVID-19 virus 09/29/2019   Hyponatremia 09/28/2019   Sinus tachycardia 09/28/2019   Insomnia 08/26/2018   Nephropathy 03/26/2018   Urinary urgency 03/26/2018   Type 2 diabetes mellitus with diabetic nephropathy, without long-term current use of insulin ([place]) 03/26/2018   Essential hypertension 03/26/2018   Chronic kidney disease (CKD), stage III (moderate)  ([place]) 03/26/2018   Conductive hearing loss, bilateral 11/07/2017   Uncontrolled type 2 diabetes mellitus with hyperglycemia ([place]) 07/31/2016   Dyslipidemia associated with type 2 diabetes mellitus ([place]) 05/29/2016   Microcytic anemia 02/17/2012   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   Benign hypertension 02/17/2012   Depressive disorder 02/17/2012    Conditions to be addressed/monitored: HTN, DMII, CKD Stage III, and Depression  Care Plan : Social Work [place]  Updates made by [name] since 12/13/2020 12:00 AM     Problem: Home and Family Safety (Wellness)      Goal: Patient Safety Concerns   Start Date: 10/20/2020  Expected End Date: 11/19/2020  This Visit's Progress: Not on track  Recent Progress: On track  Priority: High  Note:   Current Barriers:  Chronic disease management support and education needs related to HTN, DM, and CKD Stage III   8 ED visits during the month of July  Majority of which he left AMA - One ED visit he expressed depression due to fight with his girlfriend stating "he cannot be happy without her" per provider documentation the patient was screened and provided with outpatient [place] resources Unable to contact the patient - contact number is no longer working Patient was a no call no show for office visit with primary provider on 8.4.22  Social Worker Clinical Goal(s):  SW will contact [employer] department to request a welfare check based on above barriers SW will mail patient a letter requesting he contact a member of the care management team SW will collaborate with patients primary care team as needed to discuss concerns for patient safety  SW Interventions:  Inter-disciplinary care team collaboration (see longitudinal plan of care) Collaboration with [name], [place] regarding development and update of comprehensive plan of care as evidenced by provider attestation and co-signature Successful outbound call placed to the patient to  assess for care coordination needs Discussed the patient was recently seen by [facility]- patient states he was transported to the center due to law enforcement thinking the patient was suicidal. Patient reports he was assessed and discharged with suggestion to have outpatient counseling Performed chart review to note patient was seen by his primary care provider on 12/06/20 - patient was prescribed Abilify 2mg  as well as referred to both Psychology and Psychiatry Determined the patient has yet to pick up Abilify from the pharmacy and reports he may go "today" Noted the patient was contacted by [place] on 9/27 regarding Psychology referral - patient declined services Patient stated to SW "I don't want to talk to anyone about my problems that won't make it better. The only think that makes me feel good is my lady friend. She is my best therapy" SW encouraged the patient to pick up his prescribed medication and take as recommended by his provider Discussed plans for SW to contact the patient over the next 10 days Collaboration with [name] [name] and [place] to advise of outcome of  today's call  Patient Goals/Self-Care Activities patient will:  -Pick up Abilify and take as prescribed  Follow Up Plan:  SW will follow up with the patient over the next 10 days       Follow Up Plan: SW will follow up with patient by phone over the next 10 days      [name], BSW, CDP Social Worker, Certified Dementia Practitioner [place] [facility] [place] [facility] [phone]

## 2020-12-14 ENCOUNTER — Telehealth: Payer: Self-pay

## 2020-12-14 ENCOUNTER — Ambulatory Visit: Payer: Medicare Other

## 2020-12-14 DIAGNOSIS — R1032 Left lower quadrant pain: Secondary | ICD-10-CM | POA: Diagnosis not present

## 2020-12-14 DIAGNOSIS — R300 Dysuria: Secondary | ICD-10-CM | POA: Diagnosis not present

## 2020-12-14 DIAGNOSIS — E119 Type 2 diabetes mellitus without complications: Secondary | ICD-10-CM | POA: Diagnosis not present

## 2020-12-14 DIAGNOSIS — I10 Essential (primary) hypertension: Secondary | ICD-10-CM | POA: Diagnosis not present

## 2020-12-14 NOTE — Telephone Encounter (Signed)
This nurse called patient in regards to scheduled telephonic AWV. He stated that he did not really feel up to talking right now and wanted to reschedule for another time. Rescheduled to 01/03/2021

## 2020-12-14 NOTE — Telephone Encounter (Signed)
Pt was notified to pickup jardiance samples, take metoprolol 2 times per day as prescribed and keep record of reading and provide the readings to [place], fnp

## 2020-12-15 DIAGNOSIS — E1169 Type 2 diabetes mellitus with other specified complication: Secondary | ICD-10-CM

## 2020-12-15 DIAGNOSIS — I10 Essential (primary) hypertension: Secondary | ICD-10-CM

## 2020-12-15 DIAGNOSIS — C61 Malignant neoplasm of prostate: Secondary | ICD-10-CM | POA: Diagnosis not present

## 2020-12-15 DIAGNOSIS — N1831 Chronic kidney disease, stage 3a: Secondary | ICD-10-CM

## 2020-12-15 DIAGNOSIS — F329 Major depressive disorder, single episode, unspecified: Secondary | ICD-10-CM

## 2020-12-15 DIAGNOSIS — E785 Hyperlipidemia, unspecified: Secondary | ICD-10-CM | POA: Diagnosis not present

## 2020-12-15 DIAGNOSIS — F331 Major depressive disorder, recurrent, moderate: Secondary | ICD-10-CM | POA: Diagnosis not present

## 2020-12-15 DIAGNOSIS — E1121 Type 2 diabetes mellitus with diabetic nephropathy: Secondary | ICD-10-CM | POA: Diagnosis not present

## 2020-12-16 ENCOUNTER — Ambulatory Visit (HOSPITAL_COMMUNITY)
Admission: RE | Admit: 2020-12-16 | Discharge: 2020-12-16 | Disposition: A | Discharge status: Home / Self Care | Payer: 59 | Attending: Emergency Medicine | Admitting: Emergency Medicine

## 2020-12-16 DIAGNOSIS — F419 Anxiety disorder, unspecified: Secondary | ICD-10-CM | POA: Insufficient documentation

## 2020-12-16 NOTE — H&P (Signed)
Behavioral Health Medical Screening Exam  [name] is an 65 y.o. male. Patient presented voluntarily to [facility] with complaint of being heartbroken over his girlfriend not wanting to talk to him. We discussed giving her some space and letting her decide when she is ready to talk. He agreed to this. He denied SI/HI/AVH, paranoia and delusions. He denied drug and alcohol use. He did not appear impaired. He stated he would like some resources for therapy and possibly medications. He is calm and cooperative. Patient does not meet criteria for inpatient hospitalization.   Total Time spent with patient: 20 minutes  Psychiatric Specialty Exam: Physical Exam Vitals and nursing note reviewed.  Constitutional:      Appearance: Normal appearance.  HENT:     Head: Normocephalic.  Pulmonary:     Effort: Pulmonary effort is normal.  Musculoskeletal:        General: Normal range of motion.     Cervical back: Normal range of motion.  Neurological:     General: No focal deficit present.     Mental Status: He is alert and oriented to person, place, and time.  Psychiatric:        Attention and Perception: Attention and perception normal. He does not perceive auditory or visual hallucinations.        Mood and Affect: Mood normal.        Speech: Speech normal.        Behavior: Behavior normal. Behavior is cooperative.        Thought Content: Thought content normal. Thought content is not paranoid or delusional. Thought content does not include homicidal or suicidal ideation. Thought content does not include homicidal or suicidal plan.        Cognition and Memory: Cognition normal.   Review of Systems  Constitutional: Negative.   HENT: Negative.    Respiratory: Negative.    Genitourinary: Negative.   Musculoskeletal: Negative.   Neurological: Negative.    Blood pressure 116/80, pulse 87, temperature 97.7 F (36.5 C), temperature source Oral, resp. rate 18, SpO2 99 %.There is no height or weight on  file to calculate BMI. General Appearance: Casual Eye Contact:  Good Speech:  Clear and Coherent and Normal Rate Volume:  Normal Mood:  Euthymic Affect:  Congruent Thought Process:  Coherent and Linear Orientation:  Full (Time, Place, and Person) Thought Content:  Logical Suicidal Thoughts:  No Homicidal Thoughts:  No Memory:  Immediate;   Good Recent;   Good Remote;   Fair Judgement:  Good Insight:  Good Psychomotor Activity:  Normal Concentration: Concentration: Good and Attention Span: Good Recall:  Good Fund of Knowledge:Good Language: Good Akathisia:  Negative Handed:  Right AIMS (if indicated):    Assets:  Communication Skills Resilience Social Support Sleep:     Musculoskeletal: Strength & Muscle Tone: within normal limits Gait & Station: normal Patient leans: N/A  Blood pressure 116/80, pulse 87, temperature 97.7 F (36.5 C), temperature source Oral, resp. rate 18, SpO2 99 %.  Recommendations: Based on my evaluation the patient does not appear to have an emergency medical condition. Patient provided with outpatient resources for therapy and medication management, if needed  Ethelene Hal, NP 12/16/2020, 5:41 PM

## 2020-12-16 NOTE — BH Assessment (Signed)
Dewane E. Larke is a 65 year old male who presents voluntary unaccompanied to [facility] requesting counseling resources for anxiety associated with a recent relationship he "can't get over." Patient denies any S/I, H/I or AVH. To note as soon as patient was roomed he was asking to be discharged stating "I am going to see my old counselor." Patient states he has been depressed over the breakup of a recent relationship. Patient states he "has been leaving notes on her door" and she has not responded. Patient reports he has attempted to call her numerous times but she has blocked him. Per chart review patient has presented with similar symptoms before (12/10/20 when he presented to [facility]) and did not meet inpatient criteria at that time and was recommended to follow up with OP counseling. Patient states he is seeing a counselor although he cannot recall his name but he "has his number and will call him later today." Clinician expressed he should respect his ex-partners space because at this point she does not want to talk to him. Pt denies, SI, HI, AVH, self-injurious behaviors and access to weapons. Patient denies any substance use. Patient denies being linked to OPT resources (medication management) although was provided with resources and encouraged to follow up. Romilda Garret NP met and evaluated patient and recommended patient be discharged with resources which were provided.

## 2020-12-18 ENCOUNTER — Ambulatory Visit (INDEPENDENT_AMBULATORY_CARE_PROVIDER_SITE_OTHER): Payer: Medicare Other

## 2020-12-18 ENCOUNTER — Ambulatory Visit: Payer: Self-pay

## 2020-12-18 ENCOUNTER — Telehealth: Payer: Medicare Other

## 2020-12-18 DIAGNOSIS — F331 Major depressive disorder, recurrent, moderate: Secondary | ICD-10-CM

## 2020-12-18 DIAGNOSIS — I10 Essential (primary) hypertension: Secondary | ICD-10-CM

## 2020-12-18 DIAGNOSIS — N1831 Chronic kidney disease, stage 3a: Secondary | ICD-10-CM

## 2020-12-18 DIAGNOSIS — E1121 Type 2 diabetes mellitus with diabetic nephropathy: Secondary | ICD-10-CM

## 2020-12-18 DIAGNOSIS — E1169 Type 2 diabetes mellitus with other specified complication: Secondary | ICD-10-CM

## 2020-12-18 DIAGNOSIS — C61 Malignant neoplasm of prostate: Secondary | ICD-10-CM

## 2020-12-18 NOTE — Chronic Care Management (AMB) (Signed)
Chronic Care Management    Social Work Note  12/18/2020 Name: [name] MRN: [id number] DOB: [birth year]  [name] is a 65 y.o. year old male who is a primary care patient of [name], [place]. The CCM team was consulted to assist the patient with chronic disease management and/or care coordination needs related to:  Depression, CKD III, DM II, and HTN .   Engaged with patient by telephone for follow up visit in response to provider referral for social work chronic care management and care coordination services.   Consent to Services:  The patient was given information about Chronic Care Management services, agreed to services, and gave verbal consent prior to initiation of services.  Please see initial visit note for detailed documentation.   Patient agreed to services and consent obtained.   Assessment: Review of patient past medical history, allergies, medications, and health status, including review of relevant consultants reports was performed today as part of a comprehensive evaluation and provision of chronic care management and care coordination services.     SDOH (Social Determinants of Health) assessments and interventions performed:    Advanced Directives Status: Not addressed in this encounter.  CCM Care Plan  No Known Allergies  Outpatient Encounter Medications as of 12/18/2020  Medication Sig Note   acetaminophen (TYLENOL) 325 MG tablet Take 2 tablets (650 mg total) by mouth every 6 (six) hours as needed for mild pain (or Fever >/= 101). 10/03/2020: As needed   alfuzosin (UROXATRAL) 10 MG 24 hr tablet Take 1 tablet (10 mg total) by mouth daily with breakfast.    ARIPiprazole (ABILIFY) 2 MG tablet Take 1 tablet (2 mg total) by mouth daily.    aspirin EC 81 MG tablet Take 81 mg by mouth daily with breakfast.     atorvastatin (LIPITOR) 40 MG tablet 1 tablet by mouth Monday - Friday    clotrimazole-betamethasone (LOTRISONE) cream Apply 1 application topically 2 (two)  times daily.    diclofenac Sodium (VOLTAREN) 1 % GEL Apply 4 g topically 4 (four) times daily.    empagliflozin (JARDIANCE) 10 MG TABS tablet Take 10 mg by mouth daily. Take 1 tablet by mouth daily    finasteride (PROSCAR) 5 MG tablet Take 1 tablet (5 mg total) by mouth daily.    metoprolol tartrate (LOPRESSOR) 50 MG tablet Take 1 tablet (50 mg total) by mouth 2 (two) times daily.    nystatin (NYSTATIN) powder Apply 1 application topically 3 (three) times daily.    olmesartan-hydrochlorothiazide (BENICAR HCT) 40-25 MG tablet Take 1 tablet by mouth daily.    Semaglutide (RYBELSUS) 14 MG TABS Take 1 tablet by mouth daily. Take 30 minutes before breakfast    sildenafil (REVATIO) 20 MG tablet Take by mouth.    Tetrahydrozoline HCl (VISINE OP) Place 1 drop into both eyes daily as needed (itching/irritation).    traMADol (ULTRAM) 50 MG tablet Take 1 tablet (50 mg total) by mouth every 6 (six) hours as needed for severe pain.    traZODone (DESYREL) 50 MG tablet TAKE 1 TABLET (50 MG TOTAL) BY MOUTH AT BEDTIME AS NEEDED FOR SLEEP.    No facility-administered encounter medications on file as of 12/18/2020.    Patient Active Problem List   Diagnosis Date Noted   Fall 11/11/2020   Hypertensive urgency, malignant 10/25/2020   Acute urinary retention 10/25/2020   Adjustment disorder with mixed anxiety and depressed mood    Acute renal failure superimposed on stage 3a chronic kidney disease ([place])  09/15/2020   Ataxia 08/01/2020   Dizziness 07/31/2020   Chest pain 07/31/2020   Hip pain 07/31/2020   AKI (acute kidney injury) ([place]) 02/11/2020   Hydroureteronephrosis 02/10/2020   Nausea and vomiting 02/10/2020   History of COVID-19 02/10/2020   Pneumonia due to COVID-19 virus 09/29/2019   Hyponatremia 09/28/2019   Sinus tachycardia 09/28/2019   Insomnia 08/26/2018   Nephropathy 03/26/2018   Urinary urgency 03/26/2018   Type 2 diabetes mellitus with diabetic nephropathy, without long-term current use  of insulin ([place]) 03/26/2018   Essential hypertension 03/26/2018   Chronic kidney disease (CKD), stage III (moderate) ([place]) 03/26/2018   Conductive hearing loss, bilateral 11/07/2017   Uncontrolled type 2 diabetes mellitus with hyperglycemia ([place]) 07/31/2016   Dyslipidemia associated with type 2 diabetes mellitus ([place]) 05/29/2016   Microcytic anemia 02/17/2012   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   Benign hypertension 02/17/2012   Depressive disorder 02/17/2012    Conditions to be addressed/monitored: HTN, DMII, CKD Stage III, and Depression  Care Plan : Social Work [place]  Updates made by [name] since 12/18/2020 12:00 AM     Problem: Home and Family Safety (Wellness)      Goal: Patient Safety Concerns   Start Date: 10/20/2020  Expected End Date: 11/19/2020  Recent Progress: Not on track  Priority: High  Note:   Current Barriers:  Chronic disease management support and education needs related to HTN, DM, and CKD Stage III   8 ED visits during the month of July  Majority of which he left AMA - One ED visit he expressed depression due to fight with his girlfriend stating "he cannot be happy without her" per provider documentation the patient was screened and provided with outpatient [place] resources Unable to contact the patient - contact number is no longer working Patient was a no call no show for office visit with primary provider on 8.4.22  Social Worker Clinical Goal(s):  SW will contact [employer] department to request a welfare check based on above barriers SW will mail patient a letter requesting he contact a member of the care management team SW will collaborate with patients primary care team as needed to discuss concerns for patient safety  SW Interventions:  Inter-disciplinary care team collaboration (see longitudinal plan of care) Collaboration with [name], [place] regarding development and update of comprehensive plan of care as evidenced by  provider attestation and co-signature Collaboration with [place] who indicates patient would like a call from SW Successful outbound call placed to the patient to assess for care coordination needs Patient very emotional during todays call crying stating he needs to speak with his lady friend in order to feel better. Patient begging SW to contact his lady friend on his behalf to request she contact the patient Patient reports he has not picked up the Abilify but will if SW will contact his lady friend on his behalf Assessed for patient current location - patient reports he is currently at [place] [place] with two officers with him. Patient states "they may take me to behavioral health" SW encouraged the patient to engage with officers for assistance and ended the call Collaboration with primary provider [name] [name] and [place] to advise patient is currently very emotional crying over his lady friend and is in the presence of two officers for assistance Discussed plan for care management team to monitor patient chart in order to collaborate with the ED and/or urgent care  if patient is transported for assistance  Patient Goals/Self-Care Activities patient will:  -Cooperate with law enforcement for transport to behavioral health if warranted  Follow Up Plan:  SW will follow up with the patient over the next 10 days       Follow Up Plan: SW will follow up with patient by phone over the next week      [name], BSW, CDP Social Worker, Certified Dementia Practitioner [place] [facility] [place] [facility] [phone]

## 2020-12-18 NOTE — Patient Instructions (Signed)
Social Worker Visit Information  Goals we discussed today:   Goals Addressed             This Visit's Progress    Patient Safety Concerns       Timeframe:  Short-Term Goal Priority:  High Start Date:  8.5.22                           Expected End Date: 9.4.22                        Patient Goals/Self-Care Activities patient will:  -Cooperate with law enforcement for transport to behavioral health if warranted         The patient verbalized understanding of instructions, educational materials, and care plan provided today and declined offer to receive copy of patient instructions, educational materials, and care plan.   Follow Up Plan: SW will follow up with patient by phone over the next week   [name], BSW, CDP Social Worker, Certified Dementia Practitioner [place] [facility] [place] [facility] [phone]

## 2020-12-20 NOTE — Patient Instructions (Signed)
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 Erlene Quan after completion of prostate MRI as directed             Monitor and Manage My Blood Sugar-Diabetes Type 2   On track    Timeframe:  Long-Range Goal Priority:  Medium Start Date:  04/06/20                          Expected End Date:  04/06/21                      Follow Up Date: 01/16/21  - check blood sugar at prescribed times - check blood sugar if I feel it is too high or too low - take the blood sugar log to all doctor visits - drink 6 to 8 glasses of water each day - fill half of plate with vegetables - manage portion size - keep appointment with eye doctor - schedule appointment with eye doctor - check feet daily for cuts, sores or redness - do heel pump exercise 2 to 3 times each day - keep feet up while sitting - trim toenails straight across - wash and dry feet carefully every day - wear comfortable, cotton socks - wear comfortable, well-fitting shoes   Why is this important?   Checking your blood sugar at home helps to keep it from getting very high or  very low.  Writing the results in a diary or log helps the doctor know how to care for you.  Your blood sugar log should have the time, date and the results.  Also, write down the amount of insulin or other medicine that you take.  Other information, like what you ate, exercise done and how you were feeling, will also be helpful.     Notes:      Prevent disease progression related to chronic kidney disease   On track    Timeframe:  Long-Range Goal Priority:  Medium Start Date: 04/06/20                           Expected End Date:  04/06/21  Follow Up Date:  01/16/21  Patient Goals:  -Increase daily water intake to 64 oz  -Call Nephrologist to schedule a follow up appointment                          The patient verbalized understanding of instructions, educational materials, and care plan provided today and declined offer to receive copy of patient instructions, educational materials, and care plan.   Telephone follow up appointment with care management team member scheduled for: 01/16/21  Barb Merino, RN, BSN, CCM Care Management Coordinator [place] [facility]  Direct Phone: [phone]

## 2020-12-20 NOTE — Chronic Care Management (AMB) (Signed)
Chronic Care Management   CCM RN Visit Note  12/18/2020 Name: [name] MRN: [id number] DOB: 05-Sep-1955  Subjective: [name] is a 65 y.o. year old male who is a primary care patient of [name], [place]. The care management team was consulted for assistance with disease management and care coordination needs.    Engaged with patient by telephone for follow up visit in response to provider referral for case management and/or care coordination services.   Consent to Services:  The patient was given information about Chronic Care Management services, agreed to services, and gave verbal consent prior to initiation of services.  Please see initial visit note for detailed documentation.   Patient agreed to services and verbal consent obtained.   Assessment: Review of patient past medical history, allergies, medications, health status, including review of consultants reports, laboratory and other test data, was performed as part of comprehensive evaluation and provision of chronic care management services.   SDOH (Social Determinants of Health) assessments and interventions performed:  Yes, no acute needs   CCM Care Plan  No Known Allergies  Outpatient Encounter Medications as of 12/18/2020  Medication Sig Note   acetaminophen (TYLENOL) 325 MG tablet Take 2 tablets (650 mg total) by mouth every 6 (six) hours as needed for mild pain (or Fever >/= 101). 10/03/2020: As needed   alfuzosin (UROXATRAL) 10 MG 24 hr tablet Take 1 tablet (10 mg total) by mouth daily with breakfast.    ARIPiprazole (ABILIFY) 2 MG tablet Take 1 tablet (2 mg total) by mouth daily.    aspirin EC 81 MG tablet Take 81 mg by mouth daily with breakfast.     atorvastatin (LIPITOR) 40 MG tablet 1 tablet by mouth Monday - Friday    clotrimazole-betamethasone (LOTRISONE) cream Apply 1 application topically 2 (two) times daily.    diclofenac Sodium (VOLTAREN) 1 % GEL Apply 4 g topically 4 (four) times daily.     empagliflozin (JARDIANCE) 10 MG TABS tablet Take 10 mg by mouth daily. Take 1 tablet by mouth daily    finasteride (PROSCAR) 5 MG tablet Take 1 tablet (5 mg total) by mouth daily.    metoprolol tartrate (LOPRESSOR) 50 MG tablet Take 1 tablet (50 mg total) by mouth 2 (two) times daily.    nystatin (NYSTATIN) powder Apply 1 application topically 3 (three) times daily.    olmesartan-hydrochlorothiazide (BENICAR HCT) 40-25 MG tablet Take 1 tablet by mouth daily.    Semaglutide (RYBELSUS) 14 MG TABS Take 1 tablet by mouth daily. Take 30 minutes before breakfast    sildenafil (REVATIO) 20 MG tablet Take by mouth.    Tetrahydrozoline HCl (VISINE OP) Place 1 drop into both eyes daily as needed (itching/irritation).    traMADol (ULTRAM) 50 MG tablet Take 1 tablet (50 mg total) by mouth every 6 (six) hours as needed for severe pain.    traZODone (DESYREL) 50 MG tablet TAKE 1 TABLET (50 MG TOTAL) BY MOUTH AT BEDTIME AS NEEDED FOR SLEEP.    No facility-administered encounter medications on file as of 12/18/2020.    Patient Active Problem List   Diagnosis Date Noted   Fall 11/11/2020   Hypertensive urgency, malignant 10/25/2020   Acute urinary retention 10/25/2020   Adjustment disorder with mixed anxiety and depressed mood    Acute renal failure superimposed on stage 3a chronic kidney disease ([place]) 09/15/2020   Ataxia 08/01/2020   Dizziness 07/31/2020   Chest pain 07/31/2020   Hip pain 07/31/2020   AKI (  acute kidney injury) ([place]) 02/11/2020   Hydroureteronephrosis 02/10/2020   Nausea and vomiting 02/10/2020   History of COVID-19 02/10/2020   Pneumonia due to COVID-19 virus 09/29/2019   Hyponatremia 09/28/2019   Sinus tachycardia 09/28/2019   Insomnia 08/26/2018   Nephropathy 03/26/2018   Urinary urgency 03/26/2018   Type 2 diabetes mellitus with diabetic nephropathy, without long-term current use of insulin ([place]) 03/26/2018   Essential hypertension 03/26/2018   Chronic kidney disease (CKD),  stage III (moderate) ([place]) 03/26/2018   Conductive hearing loss, bilateral 11/07/2017   Uncontrolled type 2 diabetes mellitus with hyperglycemia ([place]) 07/31/2016   Dyslipidemia associated with type 2 diabetes mellitus ([place]) 05/29/2016   Microcytic anemia 02/17/2012   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   Benign hypertension 02/17/2012   Depressive disorder 02/17/2012    Conditions to be addressed/monitored: CKD III, DM II, HTN, HLD, Depressive disorder, Prostate Cancer    Care Plan : Chronic Kidney (Adult)  Updates made by Lynne Logan, RN since 12/18/2020 12:00 AM     Problem: Disease Progression   Priority: Medium     Long-Range Goal: Disease Progression Prevented or Minimized   Start Date: 04/06/2020  Expected End Date: 04/06/2021  Recent Progress: On track  Priority: Medium  Note:   Objective:  Lab Results  Component Value Date   HGBA1C 8.4 (H) 12/06/2020   Lab Results  Component Value Date   CREATININE 1.67 (H) 12/06/2020   CREATININE 1.82 (H) 11/11/2020   CREATININE 2.10 (H) 10/30/2020   Lab Results  Component Value Date   EGFR 45 (L) 12/06/2020  Current Barriers:  Ineffective Self Health Maintenance Currently UNABLE TO independently self manage needs related to chronic health conditions.  Knowledge Deficits related to short term plan for care coordination needs and long term plans for chronic disease management needs Does not adhere to prescribed treatment plan  Does not take medications as prescribed Does not keep MD doctor appointments as directed Does not follow dietary recommendations  Does not call physician for concerns  Case Manager Clinical Goal(s):  Collaboration with [name], [place] regarding development and update of comprehensive plan of care as evidenced by provider attestation and co-signature Inter-disciplinary care team collaboration (see longitudinal plan of care) Patient will work with care management team to address care coordination and  chronic disease management needs related to Disease Management Educational Needs Care Coordination Medication Management and Education Psychosocial Support   Interventions:  12/18/20 completed successful outbound call with patient  Collaboration with [name] [name] regarding development and update of comprehensive plan of care as evidenced by provider attestation and co-signature Inter-disciplinary care team collaboration (see longitudinal plan of care) Provided education to patient about basic disease process related to Chronic Kidney disease Review of patient status, including review of consultant's reports, relevant laboratory and other test results, and medications completed. Reviewed medications with patient and discussed importance of medication adherence Educated on importance of increasing water to 64 oz daily; Educated on the importance to keep BP under good control, discussed target BP <130/80; Educated on the importance to keep DM under good control  Discussed plans with patient for ongoing care management follow up and provided patient with direct contact information for care management team Patient Goals:  -Increase daily water intake to 64 oz  -Call Nephrologist to schedule a follow up appointment                      Follow Up Plan: Telephone follow up appointment with  care management team member scheduled for: 01/16/21    Care Plan : Diabetes Type 2 (Adult)  Updates made by Lynne Logan, RN since 12/18/2020 12:00 AM     Problem: Disease Progression (Diabetes, Type 2)   Priority: Medium     Long-Range Goal: Disease Progression Prevented or Minimized   Start Date: 04/06/2020  Expected End Date: 04/06/2021  Recent Progress: Not on track  Priority: Medium  Note:   Objective:  Lab Results  Component Value Date   HGBA1C 8.4 (H) 12/06/2020   Lab Results  Component Value Date   CREATININE 1.67 (H) 12/06/2020   CREATININE 1.82 (H) 11/11/2020   CREATININE 2.10 (H)  10/30/2020   Lab Results  Component Value Date   EGFR 45 (L) 12/06/2020   Current Barriers:  Knowledge Deficits related to basic Diabetes pathophysiology and self care/management Knowledge Deficits related to medications used for management of diabetes Literacy barriers Does not use cbg meter  Limited Social Support Unable to self administer medications as prescribed Does not attend all scheduled provider appointments Does not adhere to prescribed medication regimen Does not adhere to prescribed psychotropic medication regimen Does not contact provider office for questions/concerns Case Manager Clinical Goal(s):  patient will demonstrate improved adherence to prescribed treatment plan for diabetes self care/management as evidenced by: daily monitoring and recording of CBG  adherence to ADA/ carb modified diet exercise 5 days/week adherence to prescribed medication regimen contacting provider for new or worsened symptoms or questions Interventions:  12/18/20 completed successful outbound call with patient  Collaboration with [name], [place] regarding development and update of comprehensive plan of care as evidenced by provider attestation and co-signature Inter-disciplinary care team collaboration (see longitudinal plan of care) Provided education to patient about basic DM disease process Review of patient status, including review of consultant's reports, relevant laboratory and other test results, and medications completed. Reviewed medications with patient and discussed importance of medication adherence Educated patient on dietary and exercise recommendations; daily glycemic control FBS 80-130, <180 after meals;15'15' rule Advised patient, providing education and rationale, to check cbg daily before meals and at bedtime and record, calling the CCM team and or PCP for findings outside established parameters Discussed plans with patient for ongoing care management follow up and provided  patient with direct contact information for care management team Self-Care Activities - Does not adhere to ADA/carb modified diet  Patient Goals: - check blood sugar at prescribed times - check blood sugar if I feel it is too high or too low - take the blood sugar log to all doctor visits - drink 6 to 8 glasses of water each day - fill half of plate with vegetables - manage portion size - keep appointment with eye doctor - schedule appointment with eye doctor - check feet daily for cuts, sores or redness - do heel pump exercise 2 to 3 times each day - keep feet up while sitting - trim toenails straight across - wash and dry feet carefully every day - wear comfortable, cotton socks - wear comfortable, well-fitting shoes  Follow Up Plan: Telephone follow up appointment with care management team member scheduled for: 01/16/21     Care Plan : Hypertension (Adult)  Updates made by Lynne Logan, RN since 12/18/2020 12:00 AM     Problem: Disease Progression (Hypertension)   Priority: Medium     Long-Range Goal: Disease Progression Prevented or Minimized   Start Date: 04/06/2020  Expected End Date: 04/06/2021  Recent Progress: On track  Priority: Medium  Note:   Objective:  Last practice recorded BP readings:  BP Readings from Last 3 Encounters:  12/06/20 114/68  11/15/20 (!) 148/90  11/11/20 (!) 152/95   Most recent eGFR/CrCl:  Lab Results  Component Value Date   EGFR 45 (L) 12/06/2020    No components found for: CRCL Current Barriers:  Knowledge Deficits related to basic understanding of hypertension pathophysiology and self care management Knowledge Deficits related to understanding of medications prescribed for management of hypertension Does not adhere to provider recommendations re:  Does not attend all scheduled provider appointments Does not adhere to prescribed medication regimen Does not adhere to prescribed psychotropic medication regimen Does not contact  provider office for questions/concerns Case Manager Clinical Goal(s):  patient will verbalize understanding of plan for hypertension management patient will demonstrate improved adherence to prescribed treatment plan for hypertension as evidenced by taking all medications as prescribed, monitoring and recording blood pressure as directed, adhering to low sodium/DASH diet Interventions:  12/18/20 completed unsuccessful outbound call with patient  Collaboration with [name], [place] regarding development and update of comprehensive plan of care as evidenced by provider attestation and co-signature Inter-disciplinary care team collaboration (see longitudinal plan of care) Evaluation of current treatment plan related to hypertension self management and patient's adherence to plan as established by provider. Provided education to patient re: stroke prevention, s/s of heart attack and stroke, DASH diet, complications of uncontrolled blood pressure Reviewed medications with patient and discussed importance of compliance Discussed plans with patient for ongoing care management follow up and provided patient with direct contact information for care management team Self-Care Activities: Self administers medications as prescribed Attends all scheduled provider appointments Calls provider office for new concerns, questions, or BP outside discussed parameters Checks BP and records as discussed Follows a low sodium diet/DASH diet Patient Goals: - learn about high blood pressure  Follow Up Plan: Telephone follow up appointment with care management team member scheduled for: 01/16/21    Care Plan : Depression (Adult)  Updates made by Lynne Logan, RN since 12/18/2020 12:00 AM     Problem: Harm or Injury (Depression)   Priority: High     Long-Range Goal: Harm or Injury Prevented   Start Date: 10/31/2020  Expected End Date: 05/03/2021  Recent Progress: On track  Priority: High  Note:   Current  Barriers:  Ineffective Self Health Maintenance in a patient with CKD III, DM II, HTN, HLD Does not adhere to provider recommendations re: Psychiatric follow up for depression  Does not attend all scheduled provider appointments Does not adhere to prescribed medication regimen Does not adhere to prescribed psychotropic medication regimen Lacks social connections Does not maintain contact with provider office Does not contact provider office for questions/concerns Clinical Goal(s):  Collaboration with [name], [place] regarding development and update of comprehensive plan of care as evidenced by provider attestation and co-signature Inter-disciplinary care team collaboration (see longitudinal plan of care) patient will work with care management team to address care coordination and chronic disease management needs related to Disease Management Educational Needs Care Coordination Medication Management and Education Medication Reconciliation Psychosocial Support   Interventions:  12/18/20 completed successful outbound call with patient  Evaluation of current treatment plan related to CKD III, DM II, HTN, HLD, self-management and patient's adherence to plan as established by provider. Collaboration with [name], [name] regarding development and update of comprehensive plan of care as evidenced by provider attestation       and co-signature Inter-disciplinary  care team collaboration (see longitudinal plan of care) Determined patient revisited behavioral health on 12/10/20 after getting upset at [facility] when his lady friend would not answer his calls Discussed and reviewed patient was discharged due to verbally agreeing not to harm himself  Determined patient has not started antidepressant prescribed PCP, he states he may consider but is reluctant due to feeling this has not helped him in the past Determined patient does not plan to receive counseling due to finding this to also being  ineffective for him in the past Determined patient is calm at this time due to being able to speak with his lady friend this am and he will remain calm as long as he can visit with her later Provided active listening to patient and patient verbally agrees not to harm himself Educated patient about the disease process related to depression and encouraged patient to consider filling his Rx for the prescribed antidepressant to help with his depression and ineffective coping  Sent secure message to PCP [name] and embedded BSW [name] with a patient status update and informed Tillie Rung patient would like a call back from her to "talk about some things" Discussed plans with patient for ongoing care management follow up and provided patient with direct contact information for care management team 12/12/20 Chart Review for recent encounter with [facility] completed on 12/10/20 [facility] Discharge Disposition for Follow up and Recommendations: Based on my evaluation the patient does not appear to have an emergency medical condition and can be discharged with resources and follow up care in outpatient services for Individual Therapy Discuss treatment options including medications and talk therapy for depression with patient. Patient decline medication at this time. Patient says he is interested in therapy. Outpatient resources provided to patient.  Open Access information included in AVS Ophelia Shoulder, NP 12/10/2020, 10:56 PM Sent secure message to PCP provider [name] [name] and embedded BSW [name] with a patient status update  Self Care Activities: - Keep PCP follow up appointment as directed  Patient Goals: -use positive distractions to help with effective coping when feeling down, sad or hopeless -notify your healthcare team promptly for worsening depression and or suicidal ideation occurs  -use your Mood [facility] Management Tool to help you determined when to  call the doctor (will be mailed to your home address)  Follow Up Plan: Telephone follow up appointment with care management team member scheduled for: 01/16/21     Care Plan : Malignant neoplasm of prostate  Updates made by Lynne Logan, RN since 12/18/2020 12:00 AM     Problem: Malignant neoplasm of prostate   Priority: High     Long-Range Goal: Malignant neoplasm of prostate complications prevented or minimized   Start Date: 12/05/2020  Expected End Date: 12/05/2021  Recent Progress: On track  Priority: High  Note:   Current Barriers:  Ineffective Self Health Maintenance in a patient with CKD III, DM II, HTN, HLD, Depressive disorder, Prostate Cancer   Does not attend all scheduled provider appointments Does not adhere to prescribed medication regimen Does not adhere to prescribed psychotropic medication regimen Lacks social connections Does not contact provider office for questions/concerns Clinical Goal(s):  Collaboration with [name], [place] regarding development and update of comprehensive plan of care as evidenced by provider attestation and co-signature Inter-disciplinary care team collaboration (see longitudinal plan of care) patient will work with care management team to address care coordination and chronic disease management needs  related to Disease Management Educational Needs Care Coordination Medication Management and Education Medication Reconciliation Psychosocial Support   Interventions:  12/18/20 completed successful outbound call with patient  Evaluation of current treatment plan related to  Malignant neoplasm of prostate , self-management and patient's adherence to plan as established by provider. Collaboration with [name], [name] regarding development and update of comprehensive plan of care as evidenced by provider attestation       and co-signature Inter-disciplinary care team collaboration (see longitudinal plan of care) Determined patient  completed a follow up with Dr. Erlene Quan, Urologist on 12/12/20 with the following Assessment/Plan reviewed:  Assessment/ Plan: 1.  Prostate cancer  - PSA is rising agree with prostate MRI as recommended by his [place]. He does state he would like to continue pursuing care in [place]  -  prostate MRI; scheduled  2. BPH with outlet obstruction - Consistent with chronic outlet obstruction  - start on finasteride 5 mg to regimen  -Continue alpha-blocker -We will discuss option for outlet procedure pending MRI results/treatment plan for #1 3. Erectile dysfunction  - sildenafil with maximum therapy with Starlee Corralejo effect  - counseled him on taking this with a full stomach  - will consider injection in the future is symptoms persist  Follow-up after prostate MRI Determined patient verbalizes understanding of his prescribed treatment plan Discussed plans with patient for ongoing care management follow up and provided patient with direct contact information for care management team Self Care Activities:  Patient verbalizes understanding of plan to follow up with Dr. Hollice Espy MD at [facility] on 12/12/20 $[email or web address]'[id number]$ :00 AM  Patient Goals: - keep prostate MRI appointment as directed - follow up with Dr. Erlene Quan after completion of prostate MRI as directed   Follow Up Plan: Telephone follow up appointment with care management team member scheduled for: 01/16/21      Plan:Telephone follow up appointment with care management team member scheduled for:  01/16/21  Barb Merino, RN, BSN, CCM Care Management Coordinator [facility]  Direct Phone: [phone]

## 2020-12-22 ENCOUNTER — Telehealth: Payer: Medicare Other

## 2020-12-22 ENCOUNTER — Ambulatory Visit: Payer: Medicare Other

## 2020-12-22 ENCOUNTER — Ambulatory Visit: Payer: Self-pay

## 2020-12-22 DIAGNOSIS — C61 Malignant neoplasm of prostate: Secondary | ICD-10-CM

## 2020-12-22 DIAGNOSIS — E1121 Type 2 diabetes mellitus with diabetic nephropathy: Secondary | ICD-10-CM

## 2020-12-22 DIAGNOSIS — F331 Major depressive disorder, recurrent, moderate: Secondary | ICD-10-CM

## 2020-12-22 DIAGNOSIS — E1169 Type 2 diabetes mellitus with other specified complication: Secondary | ICD-10-CM

## 2020-12-22 DIAGNOSIS — N1831 Chronic kidney disease, stage 3a: Secondary | ICD-10-CM

## 2020-12-22 DIAGNOSIS — I10 Essential (primary) hypertension: Secondary | ICD-10-CM

## 2020-12-22 NOTE — Chronic Care Management (AMB) (Signed)
Chronic Care Management   CCM RN Visit Note  12/22/2020 Name: [name] MRN: [id number] DOB: 05/20/1955  Subjective: [name] is a 65 y.o. year old male who is a primary care patient of [name], [place]. The care management team was consulted for assistance with disease management and care coordination needs.    Collaboration with embedded BSW [name]  for  Case Collaboration   in response to provider referral for case management and/or care coordination services.   Consent to Services:  The patient was given information about Chronic Care Management services, agreed to services, and gave verbal consent prior to initiation of services.  Please see initial visit note for detailed documentation.   Patient agreed to services and verbal consent obtained.   Assessment: Review of patient past medical history, allergies, medications, health status, including review of consultants reports, laboratory and other test data, was performed as part of comprehensive evaluation and provision of chronic care management services.   SDOH (Social Determinants of Health) assessments and interventions performed:    CCM Care Plan  No Known Allergies  Outpatient Encounter Medications as of 12/22/2020  Medication Sig Note   acetaminophen (TYLENOL) 325 MG tablet Take 2 tablets (650 mg total) by mouth every 6 (six) hours as needed for mild pain (or Fever >/= 101). 10/03/2020: As needed   alfuzosin (UROXATRAL) 10 MG 24 hr tablet Take 1 tablet (10 mg total) by mouth daily with breakfast.    ARIPiprazole (ABILIFY) 2 MG tablet Take 1 tablet (2 mg total) by mouth daily.    aspirin EC 81 MG tablet Take 81 mg by mouth daily with breakfast.     atorvastatin (LIPITOR) 40 MG tablet 1 tablet by mouth Monday - Friday    clotrimazole-betamethasone (LOTRISONE) cream Apply 1 application topically 2 (two) times daily.    diclofenac Sodium (VOLTAREN) 1 % GEL Apply 4 g topically 4 (four) times daily.     empagliflozin (JARDIANCE) 10 MG TABS tablet Take 10 mg by mouth daily. Take 1 tablet by mouth daily    finasteride (PROSCAR) 5 MG tablet Take 1 tablet (5 mg total) by mouth daily.    metoprolol tartrate (LOPRESSOR) 50 MG tablet Take 1 tablet (50 mg total) by mouth 2 (two) times daily.    nystatin (NYSTATIN) powder Apply 1 application topically 3 (three) times daily.    olmesartan-hydrochlorothiazide (BENICAR HCT) 40-25 MG tablet Take 1 tablet by mouth daily.    Semaglutide (RYBELSUS) 14 MG TABS Take 1 tablet by mouth daily. Take 30 minutes before breakfast    sildenafil (REVATIO) 20 MG tablet Take by mouth.    Tetrahydrozoline HCl (VISINE OP) Place 1 drop into both eyes daily as needed (itching/irritation).    traMADol (ULTRAM) 50 MG tablet Take 1 tablet (50 mg total) by mouth every 6 (six) hours as needed for severe pain.    traZODone (DESYREL) 50 MG tablet TAKE 1 TABLET (50 MG TOTAL) BY MOUTH AT BEDTIME AS NEEDED FOR SLEEP.    No facility-administered encounter medications on file as of 12/22/2020.    Patient Active Problem List   Diagnosis Date Noted   Fall 11/11/2020   Hypertensive urgency, malignant 10/25/2020   Acute urinary retention 10/25/2020   Adjustment disorder with mixed anxiety and depressed mood    Acute renal failure superimposed on stage 3a chronic kidney disease ([place]) 09/15/2020   Ataxia 08/01/2020   Dizziness 07/31/2020   Chest pain 07/31/2020   Hip pain 07/31/2020   AKI (  acute kidney injury) ([place]) 02/11/2020   Hydroureteronephrosis 02/10/2020   Nausea and vomiting 02/10/2020   History of COVID-19 02/10/2020   Pneumonia due to COVID-19 virus 09/29/2019   Hyponatremia 09/28/2019   Sinus tachycardia 09/28/2019   Insomnia 08/26/2018   Nephropathy 03/26/2018   Urinary urgency 03/26/2018   Type 2 diabetes mellitus with diabetic nephropathy, without long-term current use of insulin ([place]) 03/26/2018   Essential hypertension 03/26/2018   Chronic kidney disease (CKD),  stage III (moderate) ([place]) 03/26/2018   Conductive hearing loss, bilateral 11/07/2017   Uncontrolled type 2 diabetes mellitus with hyperglycemia ([place]) 07/31/2016   Dyslipidemia associated with type 2 diabetes mellitus ([place]) 05/29/2016   Microcytic anemia 02/17/2012   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   Benign hypertension 02/17/2012   Depressive disorder 02/17/2012    Conditions to be addressed/monitored: CKD III, DM II, HTN, HLD, Depressive disorder, Prostate Cancer     Care Plan : Depression (Adult)  Updates made by Lynne Logan, RN since 12/22/2020 12:00 AM     Problem: Harm or Injury (Depression)   Priority: High     Long-Range Goal: Harm or Injury Prevented   Start Date: 10/31/2020  Expected End Date: 05/03/2021  Recent Progress: On track  Priority: High  Note:   Current Barriers:  Ineffective Self Health Maintenance in a patient with CKD III, DM II, HTN, HLD Does not adhere to provider recommendations re: Psychiatric follow up for depression  Does not attend all scheduled provider appointments Does not adhere to prescribed medication regimen Does not adhere to prescribed psychotropic medication regimen Lacks social connections Does not maintain contact with provider office Does not contact provider office for questions/concerns Clinical Goal(s):  Collaboration with [name], [place] regarding development and update of comprehensive plan of care as evidenced by provider attestation and co-signature Inter-disciplinary care team collaboration (see longitudinal plan of care) patient will work with care management team to address care coordination and chronic disease management needs related to Disease Management Educational Needs Care Coordination Medication Management and Education Medication Reconciliation Psychosocial Support   Interventions:  12/18/20 completed successful outbound call with patient  Evaluation of current treatment plan related to CKD III, DM II,  HTN, HLD, self-management and patient's adherence to plan as established by provider. Collaboration with [name], [name] regarding development and update of comprehensive plan of care as evidenced by provider attestation       and co-signature Inter-disciplinary care team collaboration (see longitudinal plan of care) Determined patient revisited behavioral health on 12/10/20 after getting upset at [facility] when his lady friend would not answer his calls Discussed and reviewed patient was discharged due to verbally agreeing not to harm himself  Determined patient has not started antidepressant prescribed PCP, he states he may consider but is reluctant due to feeling this has not helped him in the past Determined patient does not plan to receive counseling due to finding this to also being ineffective for him in the past Determined patient is calm at this time due to being able to speak with his lady friend this am and he will remain calm as long as he can visit with her later Provided active listening to patient and patient verbally agrees not to harm himself Educated patient about the disease process related to depression and encouraged patient to consider filling his Rx for the prescribed antidepressant to help with his depression and ineffective coping  Sent secure message to PCP [name] and embedded BSW [name] with a patient status  update and informed Tillie Rung patient would like a call back from her to "talk about some things" Discussed plans with patient for ongoing care management follow up and provided patient with direct contact information for care management team 12/12/20 Chart [facility] for [facility] on 12/10/20 [facility] Discharge Disposition for Follow up and Recommendations: Based on my evaluation the patient does not appear to have an emergency medical condition and can be discharged with resources and follow up care in  outpatient services for Individual Therapy Discuss treatment options including medications and talk therapy for depression with patient. Patient decline medication at this time. Patient says he is interested in therapy. Outpatient resources provided to patient.  Open Access information included in AVS Ophelia Shoulder, NP 12/10/2020, 10:56 PM Sent secure message to PCP provider [name] [name] and embedded BSW [name] with a patient status update  12/22/20 Case Collaboration  Discussed patient status per embedded BSW [facility] and determined patient will benefit from ongoing engagement from [place] who will continue to outreach to Mr. Justin Mend for psychosocial needs and support Self Care Activities: - Keep PCP follow up appointment as directed  Patient Goals: -use positive distractions to help with effective coping when feeling down, sad or hopeless -notify your healthcare team promptly for worsening depression and or suicidal ideation occurs  -use your Mood [facility] Management Tool to help you determined when to call the doctor (will be mailed to your home address)  Follow Up Plan: Telephone follow up appointment with care management team member scheduled for: 01/15/21     Plan:Telephone follow up appointment with care management team member scheduled for:  01/15/21  Barb Merino, RN, BSN, CCM Care Management Coordinator [place] [facility]  Direct Phone: [phone]

## 2020-12-22 NOTE — Chronic Care Management (AMB) (Signed)
Chronic Care Management    Social Work Note  12/22/2020 Name: [name] MRN: [id number] DOB: Jul 11, 1955  [name] is a 65 y.o. year old male who is a primary care patient of [name], [place]. The CCM team was consulted to assist the patient with chronic disease management and/or care coordination needs related to:  CKD III, DM II, HTN .   Engaged with patient by telephone for follow up visit in response to provider referral for social work chronic care management and care coordination services.   Consent to Services:  The patient was given information about Chronic Care Management services, agreed to services, and gave verbal consent prior to initiation of services.  Please see initial visit note for detailed documentation.   Patient agreed to services and consent obtained.   Assessment: Review of patient past medical history, allergies, medications, and health status, including review of relevant consultants reports was performed today as part of a comprehensive evaluation and provision of chronic care management and care coordination services.     SDOH (Social Determinants of Health) assessments and interventions performed:    Advanced Directives Status: Not addressed in this encounter.  CCM Care Plan  No Known Allergies  Outpatient Encounter Medications as of 12/22/2020  Medication Sig Note   acetaminophen (TYLENOL) 325 MG tablet Take 2 tablets (650 mg total) by mouth every 6 (six) hours as needed for mild pain (or Fever >/= 101). 10/03/2020: As needed   alfuzosin (UROXATRAL) 10 MG 24 hr tablet Take 1 tablet (10 mg total) by mouth daily with breakfast.    ARIPiprazole (ABILIFY) 2 MG tablet Take 1 tablet (2 mg total) by mouth daily.    aspirin EC 81 MG tablet Take 81 mg by mouth daily with breakfast.     atorvastatin (LIPITOR) 40 MG tablet 1 tablet by mouth Monday - Friday    clotrimazole-betamethasone (LOTRISONE) cream Apply 1 application topically 2 (two) times daily.     diclofenac Sodium (VOLTAREN) 1 % GEL Apply 4 g topically 4 (four) times daily.    empagliflozin (JARDIANCE) 10 MG TABS tablet Take 10 mg by mouth daily. Take 1 tablet by mouth daily    finasteride (PROSCAR) 5 MG tablet Take 1 tablet (5 mg total) by mouth daily.    metoprolol tartrate (LOPRESSOR) 50 MG tablet Take 1 tablet (50 mg total) by mouth 2 (two) times daily.    nystatin (NYSTATIN) powder Apply 1 application topically 3 (three) times daily.    olmesartan-hydrochlorothiazide (BENICAR HCT) 40-25 MG tablet Take 1 tablet by mouth daily.    Semaglutide (RYBELSUS) 14 MG TABS Take 1 tablet by mouth daily. Take 30 minutes before breakfast    sildenafil (REVATIO) 20 MG tablet Take by mouth.    Tetrahydrozoline HCl (VISINE OP) Place 1 drop into both eyes daily as needed (itching/irritation).    traMADol (ULTRAM) 50 MG tablet Take 1 tablet (50 mg total) by mouth every 6 (six) hours as needed for severe pain.    traZODone (DESYREL) 50 MG tablet TAKE 1 TABLET (50 MG TOTAL) BY MOUTH AT BEDTIME AS NEEDED FOR SLEEP.    No facility-administered encounter medications on file as of 12/22/2020.    Patient Active Problem List   Diagnosis Date Noted   Fall 11/11/2020   Hypertensive urgency, malignant 10/25/2020   Acute urinary retention 10/25/2020   Adjustment disorder with mixed anxiety and depressed mood    Acute renal failure superimposed on stage 3a chronic kidney disease ([place]) 09/15/2020  Ataxia 08/01/2020   Dizziness 07/31/2020   Chest pain 07/31/2020   Hip pain 07/31/2020   AKI (acute kidney injury) ([place]) 02/11/2020   Hydroureteronephrosis 02/10/2020   Nausea and vomiting 02/10/2020   History of COVID-19 02/10/2020   Pneumonia due to COVID-19 virus 09/29/2019   Hyponatremia 09/28/2019   Sinus tachycardia 09/28/2019   Insomnia 08/26/2018   Nephropathy 03/26/2018   Urinary urgency 03/26/2018   Type 2 diabetes mellitus with diabetic nephropathy, without long-term current use of insulin ([place])  03/26/2018   Essential hypertension 03/26/2018   Chronic kidney disease (CKD), stage III (moderate) ([place]) 03/26/2018   Conductive hearing loss, bilateral 11/07/2017   Uncontrolled type 2 diabetes mellitus with hyperglycemia ([place]) 07/31/2016   Dyslipidemia associated with type 2 diabetes mellitus ([place]) 05/29/2016   Microcytic anemia 02/17/2012   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   Benign hypertension 02/17/2012   Depressive disorder 02/17/2012    Conditions to be addressed/monitored: HTN, DMII, and CKD Stage III  Care Plan : Social Work [place]  Updates made by [name] since 12/22/2020 12:00 AM     Problem: Quality of Life (General Plan of Care)      Long-Range Goal: Quality of Life Maintained   Start Date: 08/09/2020  Recent Progress: On track  Priority: Medium  Note:   Current Barriers:  Chronic disease management support and education needs related to HTN, DM, and CKD Stage III   Limited access to caregiver Recent inpatient admission   Social Worker Clinical Goal(s):  Patient will work with SW to identify and address any acute and/or chronic care coordination needs related to the self health management of HTN, DM, and CKD Stage III   Patient will work with SW to become more knowledgeable of health plan benefits   SW Interventions:  Inter-disciplinary care team collaboration (see longitudinal plan of care) Collaboration with [name], [place] regarding development and update of comprehensive plan of care as evidenced by provider attestation and co-signature Successful outbound call placed to the patient to assess care coordination needs Patient reports he is still feeling depressed (see alternate goal entry related to this complaint) Discussed the patient intends to go through treatment for prostate cancer Performed chart review to note patient seen by Urologist on 9/27 and an MRI was ordered - patient has yet to complete this imaging Assessed for patient  plans to complete imaging - patient states "I need to follow up with them, what is today's date?" SW provided today's date to the patient, patient changed the subject to inform SW he is shopping for his lady friends daughter who has an upcoming birthday. Patient states he hopes to speak with his lady friend when she gets off work today Unable to refocus patient to plan to complete MRI Collaboration with [place] to inform of patients plan to undergo treatment for prostate cancer  Patient Goals/Self-Care Activities patient will:  -Follow up with Urology   -  Contact his provider to schedule an appointment as needed -Contact SW as needed prior to next scheduled call  Follow Up Plan:  SW will follow up with the patient over the next month    Problem: Home and Family Safety (Wellness)      Goal: Patient Safety Concerns   Start Date: 10/20/2020  Expected End Date: 11/19/2020  This Visit's Progress: Not on track  Recent Progress: Not on track  Priority: High  Note:   Current Barriers:  Chronic disease management support and education needs  related to HTN, DM, and CKD Stage III   8 ED visits during the month of July  Majority of which he left AMA - One ED visit he expressed depression due to fight with his girlfriend stating "he cannot be happy without her" per provider documentation the patient was screened and provided with outpatient [place] resources Unable to contact the patient - contact number is no longer working Patient was a no call no show for office visit with primary provider on 8.4.22  Social Worker Clinical Goal(s):  SW will contact [employer] department to request a welfare check based on above barriers SW will mail patient a letter requesting he contact a member of the care management team SW will collaborate with patients primary care team as needed to discuss concerns for patient safety  SW Interventions:  Inter-disciplinary care team collaboration  (see longitudinal plan of care) Collaboration with [name], [place] regarding development and update of comprehensive plan of care as evidenced by provider attestation and co-signature Collaboration with [place] who indicates patient would like a call from SW Successful outbound call placed to the patient to assess for care coordination needs Patient reports he is feeling okay today as he was able to have dinner with his lady friend last night Patient indicates he continues to feel depressed and is not sure how to feel better Determined the patient has yet to pick up prescription for Abilify stating "that medicine won't make my problems disappear"  Encouraged the patient to pick up medication and take as prescribed by his primary care provider Verbal education provided to the patient explaining how the medication will help lift mood and help with coping through difficulty problems Patient stated "I know but I just don't think I will take any medicine" Encouraged the patient to follow up with his provider to address concern with adding medication  Patient Goals/Self-Care Activities patient will:  -Follow up with primary provider as needed to address concern with depression  Follow Up Plan:  SW will follow up with the patient over the next month       Follow Up Plan: SW will follow up with patient by phone over the next month      [name], BSW, CDP Social Worker, Certified Dementia Practitioner [place] [facility] [place] [facility] [phone]

## 2020-12-22 NOTE — Patient Instructions (Signed)
Social Worker Visit Information  Goals we discussed today:   Goals Addressed             This Visit's Progress    Patient Safety Concerns       Timeframe:  Short-Term Goal Priority:  High Start Date:  8.5.22                           Expected End Date: 9.4.22                        Patient Goals/Self-Care Activities patient will:  -Follow up with primary provider as needed to address concern with depression     Quality of Life Maintained       Timeframe:  Long-Range Goal Priority:  Medium Start Date:   5.25.22                                          Next planned outreach: 11.15.22  Patient Goals/Self-Care Activities patient will:  -Follow up with Urology   - Contact his provider to schedule an appointment as needed -Contact SW as needed prior to next scheduled call         The patient verbalized understanding of instructions, educational materials, and care plan provided today and declined offer to receive copy of patient instructions, educational materials, and care plan.   Follow Up Plan: SW will follow up with patient by phone over the next month   [name], BSW, CDP Social Worker, Certified Dementia Practitioner [place] [facility] [place] [facility] [phone]

## 2020-12-26 ENCOUNTER — Ambulatory Visit: Payer: Medicare Other | Admitting: Nurse Practitioner

## 2020-12-27 DIAGNOSIS — R42 Dizziness and giddiness: Secondary | ICD-10-CM | POA: Diagnosis not present

## 2020-12-27 DIAGNOSIS — R739 Hyperglycemia, unspecified: Secondary | ICD-10-CM | POA: Diagnosis not present

## 2020-12-29 ENCOUNTER — Other Ambulatory Visit: Payer: Self-pay | Admitting: Nurse Practitioner

## 2021-01-03 ENCOUNTER — Telehealth: Payer: Self-pay

## 2021-01-03 ENCOUNTER — Ambulatory Visit: Payer: Medicare Other

## 2021-01-03 ENCOUNTER — Other Ambulatory Visit: Payer: Self-pay | Admitting: Nurse Practitioner

## 2021-01-03 DIAGNOSIS — R4586 Emotional lability: Secondary | ICD-10-CM

## 2021-01-03 DIAGNOSIS — F331 Major depressive disorder, recurrent, moderate: Secondary | ICD-10-CM

## 2021-01-03 NOTE — Telephone Encounter (Signed)
This nurse called patient for scheduled telephonic AWV. Patient states that this is not a good time. Rescheduled to 01/11/2021.

## 2021-01-04 ENCOUNTER — Encounter (HOSPITAL_COMMUNITY): Payer: Self-pay

## 2021-01-04 ENCOUNTER — Observation Stay (HOSPITAL_COMMUNITY)
Admission: EM | Admit: 2021-01-04 | Discharge: 2021-01-08 | Disposition: A | Discharge status: Home / Self Care | Payer: Medicare Other | Attending: Emergency Medicine | Admitting: Emergency Medicine

## 2021-01-04 ENCOUNTER — Emergency Department (HOSPITAL_COMMUNITY): Payer: Medicare Other

## 2021-01-04 DIAGNOSIS — I700 Atherosclerosis of aorta: Secondary | ICD-10-CM | POA: Diagnosis not present

## 2021-01-04 DIAGNOSIS — I129 Hypertensive chronic kidney disease with stage 1 through stage 4 chronic kidney disease, or unspecified chronic kidney disease: Secondary | ICD-10-CM | POA: Insufficient documentation

## 2021-01-04 DIAGNOSIS — R0602 Shortness of breath: Secondary | ICD-10-CM | POA: Insufficient documentation

## 2021-01-04 DIAGNOSIS — Z8546 Personal history of malignant neoplasm of prostate: Secondary | ICD-10-CM | POA: Insufficient documentation

## 2021-01-04 DIAGNOSIS — K8020 Calculus of gallbladder without cholecystitis without obstruction: Secondary | ICD-10-CM | POA: Diagnosis not present

## 2021-01-04 DIAGNOSIS — D45 Polycythemia vera: Secondary | ICD-10-CM | POA: Insufficient documentation

## 2021-01-04 DIAGNOSIS — N1831 Chronic kidney disease, stage 3a: Secondary | ICD-10-CM | POA: Diagnosis not present

## 2021-01-04 DIAGNOSIS — Z79899 Other long term (current) drug therapy: Secondary | ICD-10-CM | POA: Insufficient documentation

## 2021-01-04 DIAGNOSIS — I499 Cardiac arrhythmia, unspecified: Secondary | ICD-10-CM | POA: Diagnosis not present

## 2021-01-04 DIAGNOSIS — R0789 Other chest pain: Secondary | ICD-10-CM | POA: Diagnosis not present

## 2021-01-04 DIAGNOSIS — E1122 Type 2 diabetes mellitus with diabetic chronic kidney disease: Secondary | ICD-10-CM | POA: Diagnosis not present

## 2021-01-04 DIAGNOSIS — Z8616 Personal history of COVID-19: Secondary | ICD-10-CM | POA: Insufficient documentation

## 2021-01-04 DIAGNOSIS — E785 Hyperlipidemia, unspecified: Secondary | ICD-10-CM

## 2021-01-04 DIAGNOSIS — N183 Chronic kidney disease, stage 3 unspecified: Secondary | ICD-10-CM | POA: Diagnosis present

## 2021-01-04 DIAGNOSIS — Z20822 Contact with and (suspected) exposure to covid-19: Secondary | ICD-10-CM | POA: Insufficient documentation

## 2021-01-04 DIAGNOSIS — M549 Dorsalgia, unspecified: Secondary | ICD-10-CM | POA: Diagnosis not present

## 2021-01-04 DIAGNOSIS — E119 Type 2 diabetes mellitus without complications: Secondary | ICD-10-CM

## 2021-01-04 DIAGNOSIS — I10 Essential (primary) hypertension: Secondary | ICD-10-CM | POA: Diagnosis present

## 2021-01-04 DIAGNOSIS — Z7982 Long term (current) use of aspirin: Secondary | ICD-10-CM | POA: Insufficient documentation

## 2021-01-04 DIAGNOSIS — R739 Hyperglycemia, unspecified: Secondary | ICD-10-CM | POA: Diagnosis not present

## 2021-01-04 DIAGNOSIS — R6889 Other general symptoms and signs: Secondary | ICD-10-CM | POA: Diagnosis not present

## 2021-01-04 DIAGNOSIS — N1330 Unspecified hydronephrosis: Secondary | ICD-10-CM | POA: Diagnosis not present

## 2021-01-04 DIAGNOSIS — Z9889 Other specified postprocedural states: Secondary | ICD-10-CM | POA: Diagnosis not present

## 2021-01-04 DIAGNOSIS — R079 Chest pain, unspecified: Secondary | ICD-10-CM | POA: Diagnosis not present

## 2021-01-04 DIAGNOSIS — Z743 Need for continuous supervision: Secondary | ICD-10-CM | POA: Diagnosis not present

## 2021-01-04 DIAGNOSIS — I2510 Atherosclerotic heart disease of native coronary artery without angina pectoris: Secondary | ICD-10-CM | POA: Diagnosis not present

## 2021-01-04 LAB — BASIC METABOLIC PANEL WITH GFR
Anion gap: 12 (ref 5–15)
BUN: 41 mg/dL — ABNORMAL HIGH (ref 8–23)
CO2: 23 mmol/L (ref 22–32)
Calcium: 9.7 mg/dL (ref 8.9–10.3)
Chloride: 100 mmol/L (ref 98–111)
Creatinine, Ser: 1.77 mg/dL — ABNORMAL HIGH (ref 0.61–1.24)
GFR, Estimated: 42 mL/min — ABNORMAL LOW (ref >60)
Glucose, Bld: 217 mg/dL — ABNORMAL HIGH (ref 70–99)
Potassium: 4.6 mmol/L (ref 3.5–5.1)
Sodium: 135 mmol/L (ref 135–145)

## 2021-01-04 LAB — CBC WITH DIFFERENTIAL/PLATELET
Abs Immature Granulocytes: 0.04 10*3/uL (ref 0.00–0.07)
Basophils Absolute: 0.1 10*3/uL (ref 0.0–0.1)
Basophils Relative: 1 %
Eosinophils Absolute: 0 10*3/uL (ref 0.0–0.5)
Eosinophils Relative: 0 %
HCT: 53.8 % — ABNORMAL HIGH (ref 39.0–52.0)
Hemoglobin: 18 g/dL — ABNORMAL HIGH (ref 13.0–17.0)
Immature Granulocytes: 0 %
Lymphocytes Relative: 11 %
Lymphs Abs: 1.1 10*3/uL (ref 0.7–4.0)
MCH: 28.9 pg (ref 26.0–34.0)
MCHC: 33.5 g/dL (ref 30.0–36.0)
MCV: 86.4 fL (ref 80.0–100.0)
Monocytes Absolute: 0.6 10*3/uL (ref 0.1–1.0)
Monocytes Relative: 6 %
Neutro Abs: 8.3 10*3/uL — ABNORMAL HIGH (ref 1.7–7.7)
Neutrophils Relative %: 82 %
Platelets: 223 10*3/uL (ref 150–400)
RBC: 6.23 MIL/uL — ABNORMAL HIGH (ref 4.22–5.81)
RDW: 13.1 % (ref 11.5–15.5)
WBC: 10 10*3/uL (ref 4.0–10.5)
nRBC: 0 % (ref 0.0–0.2)

## 2021-01-04 LAB — TROPONIN I (HIGH SENSITIVITY)
Troponin I (High Sensitivity): 8 ng/L (ref <18)
Troponin I (High Sensitivity): 7 ng/L (ref <18)

## 2021-01-04 LAB — BRAIN NATRIURETIC PEPTIDE: B Natriuretic Peptide: 13.2 pg/mL (ref 0.0–100.0)

## 2021-01-04 MED ORDER — FENTANYL CITRATE PF 50 MCG/ML IJ SOSY
50.0000 ug | PREFILLED_SYRINGE | Freq: Once | INTRAMUSCULAR | Status: AC
  Administered 2021-01-04: 50 ug via INTRAVENOUS
  Filled 2021-01-04: qty 1

## 2021-01-04 MED ORDER — ONDANSETRON HCL 4 MG/2ML IJ SOLN
4.0000 mg | Freq: Once | INTRAMUSCULAR | Status: AC
  Administered 2021-01-04: 4 mg via INTRAVENOUS
  Filled 2021-01-04: qty 2

## 2021-01-04 MED ORDER — SODIUM CHLORIDE 0.9 % IV BOLUS
500.0000 mL | Freq: Once | INTRAVENOUS | Status: AC
  Administered 2021-01-04: 500 mL via INTRAVENOUS

## 2021-01-04 MED ORDER — IOHEXOL 350 MG/ML SOLN
75.0000 mL | Freq: Once | INTRAVENOUS | Status: AC | PRN
  Administered 2021-01-04: 75 mL via INTRAVENOUS

## 2021-01-04 MED ORDER — NITROGLYCERIN 0.4 MG SL SUBL
0.4000 mg | SUBLINGUAL_TABLET | Freq: Once | SUBLINGUAL | Status: AC
  Administered 2021-01-04: 0.4 mg via SUBLINGUAL
  Filled 2021-01-04: qty 1

## 2021-01-04 MED ORDER — HYDROMORPHONE HCL 1 MG/ML IJ SOLN
1.0000 mg | Freq: Once | INTRAMUSCULAR | Status: AC
  Administered 2021-01-04: 1 mg via INTRAVENOUS
  Filled 2021-01-04: qty 1

## 2021-01-04 NOTE — ED Triage Notes (Signed)
CP started last night, radiates to jaw, worse today over time, describes as "hurting". Intermittent SOB that he states is normal. HTN for cardiac Hx, lungs clear, 12 lead ST otherwise normal.

## 2021-01-04 NOTE — ED Notes (Signed)
ED Provider at bedside. 

## 2021-01-04 NOTE — ED Notes (Signed)
Patient transported to X-ray 

## 2021-01-04 NOTE — ED Provider Notes (Signed)
[facility] EMERGENCY DEPARTMENT Provider Note   CSN: [id number] Arrival date & time: 01/04/21  1543     History Chief Complaint  Patient presents with   Chest Pain    [name] is a 65 y.o. male.  Presenting to the emergency room with concern for chest pain.  He reports that he started having pain yesterday while walking.  Did not feel that he was particularly exerting himself.  Seem to linger throughout the evening and ongoing throughout the night.  This morning seem to be worsening.  Described as pressure, mild to 6 out of 10 in severity.  Feels that the pain/pressure makes him feel somewhat short of breath.  Pain radiates up into his jaw.  No episodes of passing out.  He denies having prior history of heart disease.  Has history of CKD, hypertension.  HPI     Past Medical History:  Diagnosis Date   Acute kidney injury superimposed on chronic kidney disease ([place]) 02/10/2020   Acute on chronic kidney failure (HCC) 09/28/2019   Closed nondisplaced spiral fracture of shaft of right tibia    Complicated urinary tract infection 09/06/2016   Depression    Diabetes mellitus without complication (HCC)    GERD (gastroesophageal reflux disease)    Gout    right foot   Hemorrhoids    Hypertension    Hypertension    Macular degeneration    Microcytic anemia    Prostate cancer ([place]) 08/2009   Rectal bleeding 07/31/2016   Tubular adenoma     Patient Active Problem List   Diagnosis Date Noted   Fall 11/11/2020   Hypertensive urgency, malignant 10/25/2020   Acute urinary retention 10/25/2020   Adjustment disorder with mixed anxiety and depressed mood    Acute renal failure superimposed on stage 3a chronic kidney disease ([place]) 09/15/2020   Ataxia 08/01/2020   Dizziness 07/31/2020   Chest pain 07/31/2020   Hip pain 07/31/2020   AKI (acute kidney injury) ([place]) 02/11/2020   Hydroureteronephrosis 02/10/2020   Nausea and vomiting 02/10/2020   History of  COVID-19 02/10/2020   Pneumonia due to COVID-19 virus 09/29/2019   Hyponatremia 09/28/2019   Sinus tachycardia 09/28/2019   Insomnia 08/26/2018   Nephropathy 03/26/2018   Urinary urgency 03/26/2018   Type 2 diabetes mellitus with diabetic nephropathy, without long-term current use of insulin ([place]) 03/26/2018   Essential hypertension 03/26/2018   Chronic kidney disease (CKD), stage III (moderate) ([place]) 03/26/2018   Conductive hearing loss, bilateral 11/07/2017   Uncontrolled type 2 diabetes mellitus with hyperglycemia ([place]) 07/31/2016   Dyslipidemia associated with type 2 diabetes mellitus ([place]) 05/29/2016   Microcytic anemia 02/17/2012   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   Benign hypertension 02/17/2012   Depressive disorder 02/17/2012    Past Surgical History:  Procedure Laterality Date   CYSTOSCOPY WITH RETROGRADE PYELOGRAM, URETEROSCOPY AND STENT PLACEMENT Right 07/31/2016   Procedure: CYSTOSCOPY WITH RIGHT  RETROGRADE PYELOGRAM, URETEROSCOPY;  Surgeon: [name], MD;  Location: [facility];  Service: Urology;  Laterality: Right;   INGUINAL HERNIA REPAIR     Left ankle joint fusion  1981   PROSTATE BIOPSY     x 2   URETERAL REIMPLANTION  07/31/2016   Procedure: URETERAL REIMPLANT, right boari flap, right psoas hitch;  Surgeon: [name], MD;  Location: [facility];  Service: Urology;;       Family History  Problem Relation Age of Onset   Lung cancer Mother  Deceased   Throat cancer Brother    Pancreatic cancer Father    Heart disease Father        Deceased   Lung cancer Maternal Uncle        nephew   Diabetes Other     Social History   Tobacco Use   Smoking status: Never   Smokeless tobacco: Never  Vaping Use   Vaping Use: Never used  Substance Use Topics   Alcohol use: Yes    Comment: ocassionally   Drug use: No    Home Medications Prior to Admission medications   Medication Sig Start Date End Date Taking? Authorizing Provider   acetaminophen (TYLENOL) 325 MG tablet Take 2 tablets (650 mg total) by mouth every 6 (six) hours as needed for mild pain (or Fever >/= 101). 02/12/20   Domenic Polite, MD  alfuzosin (UROXATRAL) 10 MG 24 hr tablet Take 1 tablet (10 mg total) by mouth daily with breakfast. 11/15/20   Hollice Espy, MD  ARIPiprazole (ABILIFY) 2 MG tablet TAKE 1 TABLET BY MOUTH EVERY DAY 01/03/21   [name], [name]  aspirin EC 81 MG tablet Take 81 mg by mouth daily with breakfast.     [provider]  atorvastatin (LIPITOR) 40 MG tablet 1 tablet by mouth Monday - Friday 12/06/20   [name], [name]  clotrimazole-betamethasone (LOTRISONE) cream Apply 1 application topically 2 (two) times daily. 07/17/20   [name], [name]  diclofenac Sodium (VOLTAREN) 1 % GEL Apply 4 g topically 4 (four) times daily. 09/03/20   Palumbo, April, MD  empagliflozin (JARDIANCE) 10 MG TABS tablet Take 10 mg by mouth daily. Take 1 tablet by mouth daily    [provider]  finasteride (PROSCAR) 5 MG tablet Take 1 tablet (5 mg total) by mouth daily. 12/12/20   Hollice Espy, MD  metoprolol tartrate (LOPRESSOR) 50 MG tablet Take 1 tablet (50 mg total) by mouth 2 (two) times daily. 12/06/20   [name], [name]  nystatin (NYSTATIN) powder Apply 1 application topically 3 (three) times daily. 08/26/19   [name], [name]  olmesartan-hydrochlorothiazide (BENICAR HCT) 40-25 MG tablet Take 1 tablet by mouth daily. 09/20/20   Geradine Girt, DO  Semaglutide (RYBELSUS) 14 MG TABS Take 1 tablet by mouth daily. Take 30 minutes before breakfast 12/06/20   [name], [name]  sildenafil (REVATIO) 20 MG tablet Take by mouth. 11/30/20   [provider]  Tetrahydrozoline HCl (VISINE OP) Place 1 drop into both eyes daily as needed (itching/irritation).    [provider]  traMADol (ULTRAM) 50 MG tablet Take 1 tablet (50 mg total) by mouth every 6 (six) hours as needed for severe pain. 08/03/20   Raiford Noble Latif, DO   traZODone (DESYREL) 50 MG tablet TAKE 1 TABLET (50 MG TOTAL) BY MOUTH AT BEDTIME AS NEEDED FOR SLEEP. 06/05/20   [name], [name]    Allergies    Patient has no known allergies.  Review of Systems   Review of Systems  Constitutional:  Positive for fatigue. Negative for chills and fever.  HENT:  Negative for ear pain and sore throat.   Eyes:  Negative for pain and visual disturbance.  Respiratory:  Positive for chest tightness and shortness of breath. Negative for cough.   Cardiovascular:  Positive for chest pain. Negative for palpitations.  Gastrointestinal:  Positive for nausea. Negative for abdominal pain and vomiting.  Genitourinary:  Negative for dysuria and hematuria.  Musculoskeletal:  Negative for arthralgias and back pain.  Skin:  Negative for color change and rash.  Neurological:  Negative for seizures and syncope.  All other systems reviewed and are negative.  Physical Exam Updated Vital Signs BP 127/75 (BP Location: Right Arm)   Pulse 93   Temp 98.1 F (36.7 C) (Oral)   Resp 16   Ht 5\' 6"  (1.676 m)   Wt 79.4 kg   SpO2 96%   BMI 28.25 kg/m   Physical Exam Vitals and nursing note reviewed.  Constitutional:      Appearance: He is well-developed.  HENT:     Head: Normocephalic and atraumatic.  Eyes:     Conjunctiva/sclera: Conjunctivae normal.  Cardiovascular:     Rate and Rhythm: Normal rate and regular rhythm.     Heart sounds: No murmur heard. Pulmonary:     Effort: Pulmonary effort is normal. No respiratory distress.     Breath sounds: Normal breath sounds.  Abdominal:     Palpations: Abdomen is soft.     Tenderness: There is no abdominal tenderness.  Musculoskeletal:     Cervical back: Neck supple.     Right lower leg: No edema.     Left lower leg: No edema.  Skin:    General: Skin is warm and dry.  Neurological:     Mental Status: He is alert.    ED Results / Procedures / Treatments   Labs (all labs ordered are listed, but only abnormal  results are displayed) Labs Reviewed  CBC WITH DIFFERENTIAL/PLATELET - Abnormal; Notable for the following components:      Result Value   RBC 6.23 (*)    Hemoglobin 18.0 (*)    HCT 53.8 (*)    Neutro Abs 8.3 (*)    All other components within normal limits  BASIC METABOLIC PANEL - Abnormal; Notable for the following components:   Glucose, Bld 217 (*)    BUN 41 (*)    Creatinine, Ser 1.77 (*)    GFR, Estimated 42 (*)    All other components within normal limits  BRAIN NATRIURETIC PEPTIDE  TROPONIN I (HIGH SENSITIVITY)  TROPONIN I (HIGH SENSITIVITY)    EKG EKG Interpretation  Date/Time:  Thursday January 04 2021 15:53:00 EDT Ventricular Rate:  110 PR Interval:  129 QRS Duration: 80 QT Interval:  300 QTC Calculation: 406 R Axis:   -38 Text Interpretation: Sinus tachycardia Inferior infarct, old Consider anterior infarct no acute change when compared to prior Confirmed by Madalyn Rob [phone]) on 01/04/2021 3:55:16 PM  Radiology DG Chest 2 View  Result Date: 01/04/2021 CLINICAL DATA:  Chest pain EXAM: CHEST - 2 VIEW COMPARISON:  Chest radiograph 11/11/2020 FINDINGS: The cardiomediastinal silhouette is stable. There is no focal consolidation or pulmonary edema. There is no pleural effusion or pneumothorax. Scarring in the lateral left base is unchanged. There is no acute osseous abnormality. IMPRESSION: No radiographic evidence of acute cardiopulmonary process. Electronically Signed   By: Valetta Mole M.D.   On: 01/04/2021 17:00    Procedures Procedures   Medications Ordered in ED Medications  nitroGLYCERIN (NITROSTAT) SL tablet 0.4 mg (0.4 mg Sublingual Given 01/04/21 1720)  fentaNYL (SUBLIMAZE) injection 50 mcg (50 mcg Intravenous Given 01/04/21 1720)  HYDROmorphone (DILAUDID) injection 1 mg (1 mg Intravenous Given 01/04/21 1904)  ondansetron (ZOFRAN) injection 4 mg (4 mg Intravenous Given 01/04/21 1904)  sodium chloride 0.9 % bolus 500 mL (500 mLs Intravenous New  Bag/Given 01/04/21 1903)    ED Course  I have reviewed the triage vital signs  and the nursing notes.  Pertinent labs & imaging results that were available during my care of the patient were reviewed by me and considered in my medical decision making (see chart for details).    MDM Rules/Calculators/A&P                           65 year old male presents to ER with concern for chest pain.  Patient appeared somewhat uncomfortable but not in distress.  Vital signs were stable.  EKG did not have any obvious ischemic change and troponin x2 was within normal limits.  Due to the severity and constant nature of the pain, checked CTA chest.  No evidence for dissection.  On final reassessment he continues to have some ongoing pain.  Discussed case with cardiology, they recommend admitting to medicine for observation, echo and stress testing in the morning.  Consulted medicine for admit.   Final Clinical Impression(s) / ED Diagnoses Final diagnoses:  Chest pain, unspecified type    Rx / DC Orders ED Discharge Orders     None        Lucrezia Starch, MD 01/04/21 2333

## 2021-01-04 NOTE — ED Notes (Signed)
Patient transported to CT 

## 2021-01-05 ENCOUNTER — Encounter (HOSPITAL_COMMUNITY)
Admission: EM | Disposition: A | Discharge status: Home / Self Care | Payer: Self-pay | Source: Home / Self Care | Attending: Emergency Medicine

## 2021-01-05 ENCOUNTER — Observation Stay (HOSPITAL_BASED_OUTPATIENT_CLINIC_OR_DEPARTMENT_OTHER): Payer: Medicare Other

## 2021-01-05 DIAGNOSIS — E1159 Type 2 diabetes mellitus with other circulatory complications: Secondary | ICD-10-CM

## 2021-01-05 DIAGNOSIS — I200 Unstable angina: Secondary | ICD-10-CM

## 2021-01-05 DIAGNOSIS — R079 Chest pain, unspecified: Secondary | ICD-10-CM

## 2021-01-05 DIAGNOSIS — E782 Mixed hyperlipidemia: Secondary | ICD-10-CM

## 2021-01-05 DIAGNOSIS — N1831 Chronic kidney disease, stage 3a: Secondary | ICD-10-CM | POA: Diagnosis not present

## 2021-01-05 DIAGNOSIS — I10 Essential (primary) hypertension: Secondary | ICD-10-CM | POA: Diagnosis not present

## 2021-01-05 DIAGNOSIS — E785 Hyperlipidemia, unspecified: Secondary | ICD-10-CM

## 2021-01-05 DIAGNOSIS — I2584 Coronary atherosclerosis due to calcified coronary lesion: Secondary | ICD-10-CM

## 2021-01-05 DIAGNOSIS — E119 Type 2 diabetes mellitus without complications: Secondary | ICD-10-CM

## 2021-01-05 LAB — BASIC METABOLIC PANEL
Anion gap: 9 (ref 5–15)
BUN: 36 mg/dL — ABNORMAL HIGH (ref 8–23)
CO2: 23 mmol/L (ref 22–32)
Calcium: 8.9 mg/dL (ref 8.9–10.3)
Chloride: 102 mmol/L (ref 98–111)
Creatinine, Ser: 1.65 mg/dL — ABNORMAL HIGH (ref 0.61–1.24)
GFR, Estimated: 46 mL/min — ABNORMAL LOW (ref >60)
Glucose, Bld: 148 mg/dL — ABNORMAL HIGH (ref 70–99)
Potassium: 4.8 mmol/L (ref 3.5–5.1)
Sodium: 134 mmol/L — ABNORMAL LOW (ref 135–145)

## 2021-01-05 LAB — ECHOCARDIOGRAM COMPLETE
Area-P 1/2: 2.13 cm2
Height: 66 in
S' Lateral: 2.8 cm
Weight: 2800 oz

## 2021-01-05 LAB — CBG MONITORING, ED
Glucose-Capillary: 259 mg/dL — ABNORMAL HIGH (ref 70–99)
Glucose-Capillary: 113 mg/dL — ABNORMAL HIGH (ref 70–99)
Glucose-Capillary: 159 mg/dL — ABNORMAL HIGH (ref 70–99)
Glucose-Capillary: 144 mg/dL — ABNORMAL HIGH (ref 70–99)

## 2021-01-05 LAB — TROPONIN I (HIGH SENSITIVITY): Troponin I (High Sensitivity): 9 ng/L (ref <18)

## 2021-01-05 LAB — GLUCOSE, CAPILLARY
Glucose-Capillary: 133 mg/dL — ABNORMAL HIGH (ref 70–99)
Glucose-Capillary: 268 mg/dL — ABNORMAL HIGH (ref 70–99)

## 2021-01-05 LAB — RESP PANEL BY RT-PCR (FLU A&B, COVID) ARPGX2
Influenza A by PCR: NEGATIVE
Influenza B by PCR: NEGATIVE
SARS Coronavirus 2 by RT PCR: NEGATIVE

## 2021-01-05 SURGERY — LEFT HEART CATH AND CORONARY ANGIOGRAPHY
Anesthesia: LOCAL

## 2021-01-05 MED ORDER — SODIUM CHLORIDE 0.9% FLUSH: 3.0000 mL | INTRAVENOUS | Status: DC | PRN

## 2021-01-05 MED ORDER — FOLIC ACID 1 MG PO TABS
1.0000 mg | ORAL_TABLET | Freq: Every day | ORAL | Status: DC
  Administered 2021-01-05 – 2021-01-08 (×3): 1 mg via ORAL
  Filled 2021-01-05 (×3): qty 1

## 2021-01-05 MED ORDER — LORAZEPAM 2 MG/ML IJ SOLN
1.0000 mg | INTRAMUSCULAR | Status: AC | PRN
  Administered 2021-01-05 – 2021-01-06 (×2): 2 mg via INTRAVENOUS
  Filled 2021-01-05: qty 2
  Filled 2021-01-05 (×2): qty 1

## 2021-01-05 MED ORDER — SODIUM CHLORIDE 0.9 % IV SOLN: 250.0000 mL | INTRAVENOUS | Status: DC | PRN

## 2021-01-05 MED ORDER — LORAZEPAM 1 MG PO TABS
1.0000 mg | ORAL_TABLET | ORAL | Status: AC | PRN
  Administered 2021-01-06 (×2): 2 mg via ORAL
  Filled 2021-01-05: qty 2
  Filled 2021-01-05: qty 3

## 2021-01-05 MED ORDER — FINASTERIDE 5 MG PO TABS
5.0000 mg | ORAL_TABLET | Freq: Every day | ORAL | Status: DC
  Administered 2021-01-05 – 2021-01-08 (×4): 5 mg via ORAL
  Filled 2021-01-05 (×4): qty 1

## 2021-01-05 MED ORDER — INSULIN ASPART 100 UNIT/ML IJ SOLN
0.0000 [IU] | INTRAMUSCULAR | Status: DC
Start: 2021-01-05 — End: 2021-01-08
  Administered 2021-01-05: 1 [IU] via SUBCUTANEOUS
  Administered 2021-01-05: 5 [IU] via SUBCUTANEOUS
  Administered 2021-01-05: 1 [IU] via SUBCUTANEOUS
  Administered 2021-01-05: 5 [IU] via SUBCUTANEOUS
  Administered 2021-01-06 (×2): 2 [IU] via SUBCUTANEOUS
  Administered 2021-01-07: 3 [IU] via SUBCUTANEOUS
  Administered 2021-01-07 – 2021-01-08 (×2): 2 [IU] via SUBCUTANEOUS
  Administered 2021-01-08 (×2): 1 [IU] via SUBCUTANEOUS

## 2021-01-05 MED ORDER — METOPROLOL TARTRATE 25 MG PO TABS
50.0000 mg | ORAL_TABLET | Freq: Two times a day (BID) | ORAL | Status: DC
  Administered 2021-01-05 – 2021-01-08 (×4): 50 mg via ORAL
  Filled 2021-01-05 (×6): qty 2

## 2021-01-05 MED ORDER — SODIUM CHLORIDE 0.9 % WEIGHT BASED INFUSION: 1.0000 mL/kg/h | INTRAVENOUS | Status: DC

## 2021-01-05 MED ORDER — SODIUM CHLORIDE 0.9% FLUSH
3.0000 mL | Freq: Two times a day (BID) | INTRAVENOUS | Status: DC
Start: 2021-01-05 — End: 2021-01-08
  Administered 2021-01-05 – 2021-01-07 (×2): 3 mL via INTRAVENOUS

## 2021-01-05 MED ORDER — ASPIRIN 81 MG PO CHEW
324.0000 mg | CHEWABLE_TABLET | Freq: Once | ORAL | Status: AC
  Administered 2021-01-05: 324 mg via ORAL
  Filled 2021-01-05: qty 4

## 2021-01-05 MED ORDER — HYDROCHLOROTHIAZIDE 25 MG PO TABS: 25.0000 mg | ORAL_TABLET | Freq: Every day | ORAL | Status: DC

## 2021-01-05 MED ORDER — HEPARIN (PORCINE) IN NACL 1000-0.9 UT/500ML-% IV SOLN
INTRAVENOUS | Status: AC
  Filled 2021-01-05: qty 1000

## 2021-01-05 MED ORDER — SODIUM CHLORIDE 0.9 % IV BOLUS
500.0000 mL | Freq: Once | INTRAVENOUS | Status: AC
  Administered 2021-01-05: 500 mL via INTRAVENOUS

## 2021-01-05 MED ORDER — ENOXAPARIN SODIUM 40 MG/0.4ML IJ SOSY: 40.0000 mg | PREFILLED_SYRINGE | Freq: Every day | INTRAMUSCULAR | Status: DC

## 2021-01-05 MED ORDER — ACETAMINOPHEN 650 MG RE SUPP: 650.0000 mg | Freq: Four times a day (QID) | RECTAL | Status: DC | PRN

## 2021-01-05 MED ORDER — SODIUM CHLORIDE 0.9 % WEIGHT BASED INFUSION
1.0000 mL/kg/h | INTRAVENOUS | Status: DC
  Administered 2021-01-05: 1 mL/kg/h via INTRAVENOUS

## 2021-01-05 MED ORDER — ASPIRIN EC 81 MG PO TBEC
81.0000 mg | DELAYED_RELEASE_TABLET | Freq: Every day | ORAL | Status: DC
  Administered 2021-01-06 – 2021-01-08 (×3): 81 mg via ORAL
  Filled 2021-01-05 (×3): qty 1

## 2021-01-05 MED ORDER — THIAMINE HCL 100 MG PO TABS
100.0000 mg | ORAL_TABLET | Freq: Every day | ORAL | Status: DC
  Administered 2021-01-05 – 2021-01-08 (×3): 100 mg via ORAL
  Filled 2021-01-05 (×3): qty 1

## 2021-01-05 MED ORDER — SODIUM CHLORIDE 0.9 % WEIGHT BASED INFUSION
3.0000 mL/kg/h | INTRAVENOUS | Status: DC
Start: 2021-01-06 — End: 2021-01-05

## 2021-01-05 MED ORDER — LIDOCAINE HCL (PF) 1 % IJ SOLN
INTRAMUSCULAR | Status: AC
  Filled 2021-01-05: qty 30

## 2021-01-05 MED ORDER — ATORVASTATIN CALCIUM 40 MG PO TABS
80.0000 mg | ORAL_TABLET | Freq: Every day | ORAL | Status: DC
  Administered 2021-01-05: 80 mg via ORAL
  Filled 2021-01-05: qty 2

## 2021-01-05 MED ORDER — IRBESARTAN 150 MG PO TABS: 300.0000 mg | ORAL_TABLET | Freq: Every day | ORAL | Status: DC

## 2021-01-05 MED ORDER — ARIPIPRAZOLE 2 MG PO TABS
2.0000 mg | ORAL_TABLET | Freq: Every day | ORAL | Status: DC
  Administered 2021-01-05 – 2021-01-08 (×4): 2 mg via ORAL
  Filled 2021-01-05 (×4): qty 1

## 2021-01-05 MED ORDER — ALFUZOSIN HCL ER 10 MG PO TB24
10.0000 mg | ORAL_TABLET | Freq: Every day | ORAL | Status: DC
  Administered 2021-01-08: 10 mg via ORAL
  Filled 2021-01-05 (×3): qty 1

## 2021-01-05 MED ORDER — SODIUM CHLORIDE 0.9 % WEIGHT BASED INFUSION
3.0000 mL/kg/h | INTRAVENOUS | Status: DC
  Administered 2021-01-05: 3 mL/kg/h via INTRAVENOUS

## 2021-01-05 MED ORDER — SEMAGLUTIDE 14 MG PO TABS
1.0000 | ORAL_TABLET | Freq: Every day | ORAL | Status: DC
  Filled 2021-01-05: qty 1

## 2021-01-05 MED ORDER — OLMESARTAN MEDOXOMIL-HCTZ 40-25 MG PO TABS: 1.0000 | ORAL_TABLET | Freq: Every day | ORAL | Status: DC

## 2021-01-05 MED ORDER — HEPARIN SODIUM (PORCINE) 5000 UNIT/ML IJ SOLN
5000.0000 [IU] | Freq: Three times a day (TID) | INTRAMUSCULAR | Status: DC
  Administered 2021-01-05 – 2021-01-08 (×7): 5000 [IU] via SUBCUTANEOUS
  Filled 2021-01-05 (×8): qty 1

## 2021-01-05 MED ORDER — NITROGLYCERIN 0.4 MG SL SUBL
0.4000 mg | SUBLINGUAL_TABLET | SUBLINGUAL | Status: AC | PRN
  Administered 2021-01-05 (×3): 0.4 mg via SUBLINGUAL
  Filled 2021-01-05 (×3): qty 1

## 2021-01-05 MED ORDER — METOPROLOL TARTRATE 25 MG PO TABS
12.5000 mg | ORAL_TABLET | Freq: Two times a day (BID) | ORAL | Status: DC
  Administered 2021-01-05: 12.5 mg via ORAL
  Filled 2021-01-05: qty 1

## 2021-01-05 MED ORDER — EMPAGLIFLOZIN 10 MG PO TABS
10.0000 mg | ORAL_TABLET | Freq: Every day | ORAL | Status: DC
  Administered 2021-01-07 – 2021-01-08 (×2): 10 mg via ORAL
  Filled 2021-01-05 (×3): qty 1

## 2021-01-05 MED ORDER — NITROGLYCERIN 2 % TD OINT
0.5000 [in_us] | TOPICAL_OINTMENT | Freq: Four times a day (QID) | TRANSDERMAL | Status: DC | PRN
  Filled 2021-01-05: qty 30

## 2021-01-05 MED ORDER — THIAMINE HCL 100 MG/ML IJ SOLN: 100.0000 mg | Freq: Every day | INTRAMUSCULAR | Status: DC

## 2021-01-05 MED ORDER — HEPARIN SODIUM (PORCINE) 1000 UNIT/ML IJ SOLN
INTRAMUSCULAR | Status: AC
  Filled 2021-01-05: qty 1

## 2021-01-05 MED ORDER — SEMAGLUTIDE 14 MG PO TABS: 1.0000 | ORAL_TABLET | Freq: Every day | ORAL | Status: DC

## 2021-01-05 MED ORDER — ALFUZOSIN HCL ER 10 MG PO TB24: 10.0000 mg | ORAL_TABLET | Freq: Every day | ORAL | Status: DC

## 2021-01-05 MED ORDER — ADULT MULTIVITAMIN W/MINERALS CH
1.0000 | ORAL_TABLET | Freq: Every day | ORAL | Status: DC
  Administered 2021-01-05 – 2021-01-08 (×3): 1 via ORAL
  Filled 2021-01-05 (×3): qty 1

## 2021-01-05 MED ORDER — ATORVASTATIN CALCIUM 40 MG PO TABS
80.0000 mg | ORAL_TABLET | Freq: Every day | ORAL | Status: DC
  Administered 2021-01-05 – 2021-01-08 (×4): 80 mg via ORAL
  Filled 2021-01-05 (×4): qty 2

## 2021-01-05 MED ORDER — ACETAMINOPHEN 325 MG PO TABS
650.0000 mg | ORAL_TABLET | Freq: Four times a day (QID) | ORAL | Status: DC | PRN
  Administered 2021-01-05: 650 mg via ORAL
  Filled 2021-01-05: qty 2

## 2021-01-05 MED ORDER — VERAPAMIL HCL 2.5 MG/ML IV SOLN
INTRAVENOUS | Status: AC
  Filled 2021-01-05: qty 2

## 2021-01-05 NOTE — ED Notes (Addendum)
ED TO INPATIENT HANDOFF REPORT  ED Nurse Name and Phone #: Darnelle Maffucci [phone]  I Name/Age/Gender [name] 65 y.o. male Room/Bed: 012C/012C  Code Status   Code Status: Full Code  Home/SNF/Other Home Patient oriented to: self, place, and time Is this baseline? Yes   Triage Complete: Triage complete  Chief Complaint Chest pain [R07.9]  Triage Note CP started last night, radiates to jaw, worse today over time, describes as "hurting". Intermittent SOB that he states is normal. HTN for cardiac Hx, lungs clear, 12 lead ST otherwise normal.   Allergies No Known Allergies  Level of Care/Admitting Diagnosis ED Disposition     ED Disposition  Admit   Condition  --   Comment  Hospital Area: [facility] [place] [[id number]]  Level of Care: Telemetry Cardiac [[id number]]  May place patient in observation at [facility] or [place] if equivalent level of care is available:: Yes  Covid Evaluation: Asymptomatic Screening Protocol (No Symptoms)  Diagnosis: Chest pain [097353]  Admitting Physician: Shela Leff [[id number]]  Attending Physician: Shela Leff [[id number]]          B Medical/Surgery History Past Medical History:  Diagnosis Date   Acute kidney injury superimposed on chronic kidney disease ([place]) 02/10/2020   Acute on chronic kidney failure ([place]) 09/28/2019   Closed nondisplaced spiral fracture of shaft of right tibia    Complicated urinary tract infection 09/06/2016   Depression    Diabetes mellitus without complication (HCC)    GERD (gastroesophageal reflux disease)    Gout    right foot   Hemorrhoids    Hypertension    Hypertension    Macular degeneration    Microcytic anemia    Prostate cancer ([place]) 08/2009   Rectal bleeding 07/31/2016   Tubular adenoma    Past Surgical History:  Procedure Laterality Date   CYSTOSCOPY WITH RETROGRADE PYELOGRAM, URETEROSCOPY AND STENT PLACEMENT Right 07/31/2016   Procedure: CYSTOSCOPY WITH RIGHT  RETROGRADE  PYELOGRAM, URETEROSCOPY;  Surgeon: [name], MD;  Location: [facility];  Service: Urology;  Laterality: Right;   INGUINAL HERNIA REPAIR     Left ankle joint fusion  1981   PROSTATE BIOPSY     x 2   URETERAL REIMPLANTION  07/31/2016   Procedure: URETERAL REIMPLANT, right boari flap, right psoas hitch;  Surgeon: [name], MD;  Location: [facility];  Service: Urology;;     A IV Location/Drains/Wounds Patient Lines/Drains/Airways Status     Active Line/Drains/Airways     Name Placement date Placement time Site Days   Peripheral IV 01/04/21 20 G Right Hand 01/04/21  1652  Hand  1   Peripheral IV 01/04/21 20 G Right Antecubital 01/04/21  1938  Antecubital  1            Intake/Output Last 24 hours  Intake/Output Summary (Last 24 hours) at 01/05/2021 1348 Last data filed at 01/05/2021 1334 Gross per 24 hour  Intake 1870.53 ml  Output --  Net 1870.53 ml    Labs/Imaging Results for orders placed or performed during the hospital encounter of 01/04/21 (from the past 48 hour(s))  Brain natriuretic peptide     Status: None   Collection Time: 01/04/21  3:54 PM  Result Value Ref Range   B Natriuretic Peptide 13.2 0.0 - 100.0 pg/mL    Comment: Performed at [facility], [address] [place], [place] [address]  CBC with Differential     Status: Abnormal   Collection Time: 01/04/21  4:50 PM  Result Value Ref Range   WBC 10.0 4.0 - 10.5 K/uL   RBC 6.23 (H) 4.22 - 5.81 MIL/uL   Hemoglobin 18.0 (H) 13.0 - 17.0 g/dL   HCT 53.8 (H) 39.0 - 52.0 %   MCV 86.4 80.0 - 100.0 fL   MCH 28.9 26.0 - 34.0 pg   MCHC 33.5 30.0 - 36.0 g/dL   RDW 13.1 11.5 - 15.5 %   Platelets 223 150 - 400 K/uL   nRBC 0.0 0.0 - 0.2 %   Neutrophils Relative % 82 %   Neutro Abs 8.3 (H) 1.7 - 7.7 K/uL   Lymphocytes Relative 11 %   Lymphs Abs 1.1 0.7 - 4.0 K/uL   Monocytes Relative 6 %   Monocytes Absolute 0.6 0.1 - 1.0 K/uL   Eosinophils Relative 0 %   Eosinophils Absolute 0.0 0.0 - 0.5  K/uL   Basophils Relative 1 %   Basophils Absolute 0.1 0.0 - 0.1 K/uL   Immature Granulocytes 0 %   Abs Immature Granulocytes 0.04 0.00 - 0.07 K/uL    Comment: Performed at [facility], [address], [place], [place] [address]  Basic metabolic panel     Status: Abnormal   Collection Time: 01/04/21  4:50 PM  Result Value Ref Range   Sodium 135 135 - 145 mmol/L   Potassium 4.6 3.5 - 5.1 mmol/L   Chloride 100 98 - 111 mmol/L   CO2 23 22 - 32 mmol/L   Glucose, Bld 217 (H) 70 - 99 mg/dL    Comment: Glucose reference range applies only to samples taken after fasting for at least 8 hours.   BUN 41 (H) 8 - 23 mg/dL   Creatinine, Ser 1.77 (H) 0.61 - 1.24 mg/dL   Calcium 9.7 8.9 - 10.3 mg/dL   GFR, Estimated 42 (L) >60 mL/min    Comment: (NOTE) Calculated using the CKD-EPI Creatinine Equation (2021)    Anion gap 12 5 - 15    Comment: Performed at [facility] [address], [place], [place] [address]  Troponin I (High Sensitivity)     Status: None   Collection Time: 01/04/21  4:50 PM  Result Value Ref Range   Troponin I (High Sensitivity) 8 <18 ng/L    Comment: (NOTE) Elevated high sensitivity troponin I (hsTnI) values and significant  changes across serial measurements may suggest ACS but many other  chronic and acute conditions are known to elevate hsTnI results.  Refer to the "Links" section for chest pain algorithms and additional  guidance. Performed at [facility], [place] [address], [place], [place] [address]   Troponin I (High Sensitivity)     Status: None   Collection Time: 01/04/21  5:54 PM  Result Value Ref Range   Troponin I (High Sensitivity) 7 <18 ng/L    Comment: (NOTE) Elevated high sensitivity troponin I (hsTnI) values and significant  changes across serial measurements may suggest ACS but many other  chronic and acute conditions are known to elevate hsTnI results.  Refer to the "Links" section for chest pain algorithms and additional   guidance. Performed at [facility], [place] [address], [place], [place] [address]   Resp Panel by RT-PCR (Flu A&B, Covid) Nasopharyngeal Swab     Status: None   Collection Time: 01/05/21  1:49 AM   Specimen: Nasopharyngeal Swab; Nasopharyngeal(NP) swabs in vial transport medium  Result Value Ref Range   SARS Coronavirus 2 by RT  PCR NEGATIVE NEGATIVE    Comment: (NOTE) SARS-CoV-2 target nucleic acids are NOT DETECTED.  The SARS-CoV-2 RNA is generally detectable in upper respiratory specimens during the acute phase of infection. The lowest concentration of SARS-CoV-2 viral copies this assay can detect is 138 copies/mL. A negative result does not preclude SARS-Cov-2 infection and should not be used as the sole basis for treatment or other patient management decisions. A negative result may occur with  improper specimen collection/handling, submission of specimen other than nasopharyngeal swab, presence of viral mutation(s) within the areas targeted by this assay, and inadequate number of viral copies(<138 copies/mL). A negative result must be combined with clinical observations, patient history, and epidemiological information. The expected result is Negative.  Fact Sheet for Patients:  EntrepreneurPulse.com.au  Fact Sheet for Healthcare Providers:  IncredibleEmployment.be  This test is no t yet approved or cleared by the [place] FDA and  has been authorized for detection and/or diagnosis of SARS-CoV-2 by FDA under an Emergency Use Authorization (EUA). This EUA will remain  in effect (meaning this test can be used) for the duration of the COVID-19 declaration under Section 564(b)(1) of the Act, 21 U.S.C.section 360bbb-3(b)(1), unless the authorization is terminated  or revoked sooner.       Influenza A by PCR NEGATIVE NEGATIVE   Influenza B by PCR NEGATIVE NEGATIVE    Comment: (NOTE) The Xpert Xpress SARS-CoV-2/FLU/RSV plus assay  is intended as an aid in the diagnosis of influenza from Nasopharyngeal swab specimens and should not be used as a sole basis for treatment. Nasal washings and aspirates are unacceptable for Xpert Xpress SARS-CoV-2/FLU/RSV testing.  Fact Sheet for Patients: EntrepreneurPulse.com.au  Fact Sheet for Healthcare Providers: IncredibleEmployment.be  This test is not yet approved or cleared by the [place] FDA and has been authorized for detection and/or diagnosis of SARS-CoV-2 by FDA under an Emergency Use Authorization (EUA). This EUA will remain in effect (meaning this test can be used) for the duration of the COVID-19 declaration under Section 564(b)(1) of the Act, 21 U.S.C. section 360bbb-3(b)(1), unless the authorization is terminated or revoked.  Performed at [facility], [place] [address] [place], [place] [address]   Troponin I (High Sensitivity)     Status: None   Collection Time: 01/05/21  2:45 AM  Result Value Ref Range   Troponin I (High Sensitivity) 9 <18 ng/L    Comment: (NOTE) Elevated high sensitivity troponin I (hsTnI) values and significant  changes across serial measurements may suggest ACS but many other  chronic and acute conditions are known to elevate hsTnI results.  Refer to the "Links" section for chest pain algorithms and additional  guidance. Performed at [facility], [place] [address], [place], [place] [address]   CBG monitoring, ED     Status: Abnormal   Collection Time: 01/05/21  4:06 AM  Result Value Ref Range   Glucose-Capillary 259 (H) 70 - 99 mg/dL    Comment: Glucose reference range applies only to samples taken after fasting for at least 8 hours.   Comment 1 Notify RN    Comment 2 Document in Chart   CBG monitoring, ED     Status: Abnormal   Collection Time: 01/05/21  8:19 AM  Result Value Ref Range   Glucose-Capillary 113 (H) 70 - 99 mg/dL    Comment: Glucose reference range applies only to  samples taken after fasting for at least 8 hours.  CBG monitoring, ED     Status: Abnormal  Collection Time: 01/05/21  9:15 AM  Result Value Ref Range   Glucose-Capillary 159 (H) 70 - 99 mg/dL    Comment: Glucose reference range applies only to samples taken after fasting for at least 8 hours.  CBG monitoring, ED     Status: Abnormal   Collection Time: 01/05/21 11:19 AM  Result Value Ref Range   Glucose-Capillary 144 (H) 70 - 99 mg/dL    Comment: Glucose reference range applies only to samples taken after fasting for at least 8 hours.   DG Chest 2 View  Result Date: 01/04/2021 CLINICAL DATA:  Chest pain EXAM: CHEST - 2 VIEW COMPARISON:  Chest radiograph 11/11/2020 FINDINGS: The cardiomediastinal silhouette is stable. There is no focal consolidation or pulmonary edema. There is no pleural effusion or pneumothorax. Scarring in the lateral left base is unchanged. There is no acute osseous abnormality. IMPRESSION: No radiographic evidence of acute cardiopulmonary process. Electronically Signed   By: Valetta Mole M.D.   On: 01/04/2021 17:00   CT Angio Chest/Abd/Pel for Dissection W and/or Wo Contrast  Result Date: 01/04/2021 CLINICAL DATA:  Chest and back pain EXAM: CT ANGIOGRAPHY CHEST, ABDOMEN AND PELVIS TECHNIQUE: Non-contrast CT of the chest was initially obtained. Multidetector CT imaging through the chest, abdomen and pelvis was performed using the standard protocol during bolus administration of intravenous contrast. Multiplanar reconstructed images and MIPs were obtained and reviewed to evaluate the vascular anatomy. CONTRAST:  42mL OMNIPAQUE IOHEXOL 350 MG/ML SOLN COMPARISON:  01/06/2021, 10/25/2020 FINDINGS: CTA CHEST FINDINGS Cardiovascular: The heart is unremarkable without pericardial effusion. No evidence of thoracic aortic aneurysm or dissection. No evidence of pulmonary embolus. There is moderate atherosclerosis throughout the LAD distribution of the coronary vasculature.  Mediastinum/Nodes: No enlarged mediastinal, hilar, or axillary lymph nodes. Thyroid gland, trachea, and esophagus demonstrate no significant findings. Lungs/Pleura: No acute airspace disease, effusion, or pneumothorax. Central airways are patent. Musculoskeletal: No acute or destructive bony lesions. Reconstructed images demonstrate no additional findings. Review of the MIP images confirms the above findings. CTA ABDOMEN AND PELVIS FINDINGS VASCULAR Aorta: Normal caliber aorta without aneurysm, dissection, vasculitis or significant stenosis. Celiac: Patent without evidence of aneurysm, dissection, vasculitis or significant stenosis. SMA: Eccentric atheromatous plaque at the origin of the SMA is chronic, with approximate 50% stenosis. No evidence of acute dissection or vasculitis. No evidence of aneurysm. Renals: Both renal arteries are patent without evidence of aneurysm, dissection, vasculitis, fibromuscular dysplasia or significant stenosis. IMA: Patent without evidence of aneurysm, dissection, vasculitis or significant stenosis. Mild narrowing at the origin unchanged. Inflow: Patent without evidence of aneurysm, dissection, vasculitis or significant stenosis. Mild atherosclerosis. Veins: No obvious venous abnormality within the limitations of this arterial phase study. Review of the MIP images confirms the above findings. NON-VASCULAR Hepatobiliary: Small calcified gallstones are identified without cholecystitis. No focal liver abnormality. Pancreas: Unremarkable. No pancreatic ductal dilatation or surrounding inflammatory changes. Spleen: Normal in size without focal abnormality. Adrenals/Urinary Tract: Postsurgical changes are seen from prior right ureteral reimplantation. There is persistent right-sided hydronephrosis and hydroureter, with severe right renal cortical atrophy unchanged. Stable left kidney, with multiple renal cysts again noted. No left-sided obstruction. Bladder is stable. The adrenals are  unremarkable. Stomach/Bowel: No bowel obstruction or ileus. Normal appendix right lower quadrant. Scattered colonic diverticulosis without diverticulitis. Lymphatic: No pathologic adenopathy within the abdomen or pelvis. Reproductive: Stable enlargement of the prostate. Other: No free fluid or free gas. Stable fat containing umbilical hernias. Musculoskeletal: No acute or destructive bony lesions. Reconstructed images demonstrate no additional  findings. Review of the MIP images confirms the above findings. IMPRESSION: 1. No evidence of thoracoabdominal aortic aneurysm or dissection. 2. No evidence of pulmonary embolus. 3. Moderate coronary artery atherosclerosis. 4. Cholelithiasis without cholecystitis. 5. Chronic right renal atrophy with stable right hydronephrosis and hydroureter related to prior right ureteral surgery for reimplantation. 6. Stable enlargement of the prostate. 7.  Aortic Atherosclerosis (ICD10-I70.0). Electronically Signed   By: Randa Ngo M.D.   On: 01/04/2021 21:46    Pending Labs Unresulted Labs (From admission, onward)     Start     Ordered   01/05/21 3235  Basic metabolic panel  ONCE - STAT,   STAT        01/05/21 1012   01/05/21 0856  Erythropoietin  Once,   R        01/05/21 0855            Vitals/Pain Today's Vitals   01/05/21 0820 01/05/21 0925 01/05/21 1102 01/05/21 1309  BP: 128/79 125/83 137/86 (!) 160/93  Pulse: 63 74 68 65  Resp: 14 16 17 17   Temp:   (!) 97.5 F (36.4 C) 97.6 F (36.4 C)  TempSrc:   Oral Oral  SpO2: 100% 98% 98% 99%  Weight:      Height:      PainSc:   3  3     Isolation Precautions No active isolations  Medications Medications  nitroGLYCERIN (NITROSTAT) SL tablet 0.4 mg (0.4 mg Sublingual Given 01/05/21 0821)  acetaminophen (TYLENOL) tablet 650 mg (650 mg Oral Given 01/05/21 0429)    Or  acetaminophen (TYLENOL) suppository 650 mg ( Rectal See Alternative 01/05/21 0429)  insulin aspart (novoLOG) injection 0-9 Units (1  Units Subcutaneous Given 01/05/21 1251)  LORazepam (ATIVAN) tablet 1-4 mg (has no administration in time range)    Or  LORazepam (ATIVAN) injection 1-4 mg (has no administration in time range)  thiamine tablet 100 mg (100 mg Oral Given 01/05/21 1106)    Or  thiamine (B-1) injection 100 mg ( Intravenous See Alternative 57/32/20 2542)  folic acid (FOLVITE) tablet 1 mg (1 mg Oral Given 01/05/21 1104)  multivitamin with minerals tablet 1 tablet (1 tablet Oral Given 01/05/21 1106)  aspirin EC tablet 81 mg (has no administration in time range)  atorvastatin (LIPITOR) tablet 80 mg (80 mg Oral Given 01/05/21 1104)  metoprolol tartrate (LOPRESSOR) tablet 12.5 mg (12.5 mg Oral Given 01/05/21 1105)  nitroGLYCERIN (NITROGLYN) 2 % ointment 0.5 inch (has no administration in time range)  heparin injection 5,000 Units (5,000 Units Subcutaneous Given 01/05/21 0925)  nitroGLYCERIN (NITROSTAT) SL tablet 0.4 mg (0.4 mg Sublingual Given 01/04/21 1720)  fentaNYL (SUBLIMAZE) injection 50 mcg (50 mcg Intravenous Given 01/04/21 1720)  HYDROmorphone (DILAUDID) injection 1 mg (1 mg Intravenous Given 01/04/21 1904)  ondansetron (ZOFRAN) injection 4 mg (4 mg Intravenous Given 01/04/21 1904)  sodium chloride 0.9 % bolus 500 mL (0 mLs Intravenous Stopped 01/04/21 2005)  fentaNYL (SUBLIMAZE) injection 50 mcg (50 mcg Intravenous Given 01/04/21 2015)  iohexol (OMNIPAQUE) 350 MG/ML injection 75 mL (75 mLs Intravenous Contrast Given 01/04/21 2133)  fentaNYL (SUBLIMAZE) injection 50 mcg (50 mcg Intravenous Given 01/04/21 2255)  aspirin chewable tablet 324 mg (324 mg Oral Given 01/05/21 0429)  sodium chloride 0.9 % bolus 500 mL (0 mLs Intravenous Stopped 01/05/21 1120)    Mobility walks Moderate fall risk     R Recommendations: See Admitting Provider Note  Report given to:   Additional Notes: Pt is anxious about a heart  cath and has opted for a stress test tomorrow.  His las CIWA was done around 1300 and I scored  him a one for his anxiety, he had scored a 0 previously.   Chest pain has been a 3/10 and he hasn't wanted any medications for this.

## 2021-01-05 NOTE — Progress Notes (Addendum)
Progress Note  Patient Name: [name] Date of Encounter: 01/05/2021  [facility] Cardiologist: New (Dr. Irish Lack)  Subjective   Patient was admitted overnight with chest pain. Patient states he has been having intermittent chest pain for weeks now. He is somewhat of a difficult historian but it sound like it initially started off as exertional pain and now has been occurring at rest and waking him up from sleep at times. He states he has associated nausea with this and pain radiates to his left jaw. No history of CAD listed in chart but he states he was told he had a "blockage somewhere" many years ago but cannot remember exactly when. He is currently having 8/10 chest pain.  Inpatient Medications    Scheduled Meds:  [START ON 01/06/2021] aspirin EC  81 mg Oral Daily   atorvastatin  80 mg Oral Daily   enoxaparin (LOVENOX) injection  40 mg Subcutaneous Daily   folic acid  1 mg Oral Daily   insulin aspart  0-9 Units Subcutaneous Q4H   metoprolol tartrate  12.5 mg Oral BID   multivitamin with minerals  1 tablet Oral Daily   thiamine  100 mg Oral Daily   Or   thiamine  100 mg Intravenous Daily   Continuous Infusions:  PRN Meds: acetaminophen **OR** acetaminophen, LORazepam **OR** LORazepam, nitroGLYCERIN   Vital Signs    Vitals:   01/04/21 2100 01/05/21 0130 01/05/21 0500 01/05/21 0719  BP: 113/61 128/85 117/73 117/73  Pulse: 70 (!) 58 70 70  Resp: 13 18 17    Temp:      TempSrc:      SpO2: 96% 98% 97%   Weight:      Height:        Intake/Output Summary (Last 24 hours) at 01/05/2021 0731 Last data filed at 01/04/2021 2005 Gross per 24 hour  Intake 1000 ml  Output --  Net 1000 ml   Last 3 Weights 01/04/2021 12/12/2020 12/06/2020  Weight (lbs) 175 lb 178 lb 178 lb 9.6 oz  Weight (kg) 79.379 kg 80.74 kg 81.012 kg      Telemetry    Normal sinus rhythm with rates in the 60s. - Personally Reviewed  ECG    Normal sinus rhythm, rate 67 bpm, with poor R wave  progression in precordial leads and possible Q wave in lead III but no acute ST/T changes. Normal axis. QTc 417 ms. - Personally Reviewed  Physical Exam   GEN: No acute distress.   Neck: No JVD. Cardiac: RRR. No murmurs, rubs, or gallops. Radial pulses 2+ and equal bilaterally. Respiratory: Clear to auscultation bilaterally. No wheezes, rhonchi, or rales. GI: Soft, non-distended, and non-tender. MS: No lower extremity edema. No deformity. Skin: Warm and dry. Neuro:  No focal deficits. Psych: Normal affect. Responds appropriately.  Labs    High Sensitivity Troponin:   Recent Labs  Lab 01/04/21 1650 01/04/21 1754 01/05/21 0245  TROPONINIHS 8 7 9      Chemistry Recent Labs  Lab 01/04/21 1650  NA 135  K 4.6  CL 100  CO2 23  GLUCOSE 217*  BUN 41*  CREATININE 1.77*  CALCIUM 9.7  GFRNONAA 42*  ANIONGAP 12    Lipids No results for input(s): CHOL, TRIG, HDL, LABVLDL, LDLCALC, CHOLHDL in the last 168 hours.  Hematology Recent Labs  Lab 01/04/21 1650  WBC 10.0  RBC 6.23*  HGB 18.0*  HCT 53.8*  MCV 86.4  MCH 28.9  MCHC 33.5  RDW 13.1  PLT  223   Thyroid No results for input(s): TSH, FREET4 in the last 168 hours.  BNP Recent Labs  Lab 01/04/21 1554  BNP 13.2    DDimer No results for input(s): DDIMER in the last 168 hours.   Radiology    DG Chest 2 View  Result Date: 01/04/2021 CLINICAL DATA:  Chest pain EXAM: CHEST - 2 VIEW COMPARISON:  Chest radiograph 11/11/2020 FINDINGS: The cardiomediastinal silhouette is stable. There is no focal consolidation or pulmonary edema. There is no pleural effusion or pneumothorax. Scarring in the lateral left base is unchanged. There is no acute osseous abnormality. IMPRESSION: No radiographic evidence of acute cardiopulmonary process. Electronically Signed   By: Valetta Mole M.D.   On: 01/04/2021 17:00   CT Angio Chest/Abd/Pel for Dissection W and/or Wo Contrast  Result Date: 01/04/2021 CLINICAL DATA:  Chest and back pain  EXAM: CT ANGIOGRAPHY CHEST, ABDOMEN AND PELVIS TECHNIQUE: Non-contrast CT of the chest was initially obtained. Multidetector CT imaging through the chest, abdomen and pelvis was performed using the standard protocol during bolus administration of intravenous contrast. Multiplanar reconstructed images and MIPs were obtained and reviewed to evaluate the vascular anatomy. CONTRAST:  29mL OMNIPAQUE IOHEXOL 350 MG/ML SOLN COMPARISON:  01/06/2021, 10/25/2020 FINDINGS: CTA CHEST FINDINGS Cardiovascular: The heart is unremarkable without pericardial effusion. No evidence of thoracic aortic aneurysm or dissection. No evidence of pulmonary embolus. There is moderate atherosclerosis throughout the LAD distribution of the coronary vasculature. Mediastinum/Nodes: No enlarged mediastinal, hilar, or axillary lymph nodes. Thyroid gland, trachea, and esophagus demonstrate no significant findings. Lungs/Pleura: No acute airspace disease, effusion, or pneumothorax. Central airways are patent. Musculoskeletal: No acute or destructive bony lesions. Reconstructed images demonstrate no additional findings. Review of the MIP images confirms the above findings. CTA ABDOMEN AND PELVIS FINDINGS VASCULAR Aorta: Normal caliber aorta without aneurysm, dissection, vasculitis or significant stenosis. Celiac: Patent without evidence of aneurysm, dissection, vasculitis or significant stenosis. SMA: Eccentric atheromatous plaque at the origin of the SMA is chronic, with approximate 50% stenosis. No evidence of acute dissection or vasculitis. No evidence of aneurysm. Renals: Both renal arteries are patent without evidence of aneurysm, dissection, vasculitis, fibromuscular dysplasia or significant stenosis. IMA: Patent without evidence of aneurysm, dissection, vasculitis or significant stenosis. Mild narrowing at the origin unchanged. Inflow: Patent without evidence of aneurysm, dissection, vasculitis or significant stenosis. Mild atherosclerosis.  Veins: No obvious venous abnormality within the limitations of this arterial phase study. Review of the MIP images confirms the above findings. NON-VASCULAR Hepatobiliary: Small calcified gallstones are identified without cholecystitis. No focal liver abnormality. Pancreas: Unremarkable. No pancreatic ductal dilatation or surrounding inflammatory changes. Spleen: Normal in size without focal abnormality. Adrenals/Urinary Tract: Postsurgical changes are seen from prior right ureteral reimplantation. There is persistent right-sided hydronephrosis and hydroureter, with severe right renal cortical atrophy unchanged. Stable left kidney, with multiple renal cysts again noted. No left-sided obstruction. Bladder is stable. The adrenals are unremarkable. Stomach/Bowel: No bowel obstruction or ileus. Normal appendix right lower quadrant. Scattered colonic diverticulosis without diverticulitis. Lymphatic: No pathologic adenopathy within the abdomen or pelvis. Reproductive: Stable enlargement of the prostate. Other: No free fluid or free gas. Stable fat containing umbilical hernias. Musculoskeletal: No acute or destructive bony lesions. Reconstructed images demonstrate no additional findings. Review of the MIP images confirms the above findings. IMPRESSION: 1. No evidence of thoracoabdominal aortic aneurysm or dissection. 2. No evidence of pulmonary embolus. 3. Moderate coronary artery atherosclerosis. 4. Cholelithiasis without cholecystitis. 5. Chronic right renal atrophy with stable right hydronephrosis and  hydroureter related to prior right ureteral surgery for reimplantation. 6. Stable enlargement of the prostate. 7.  Aortic Atherosclerosis (ICD10-I70.0). Electronically Signed   By: Randa Ngo M.D.   On: 01/04/2021 21:46    Cardiac Studies   Echo pending.  Patient Profile     65 y.o. male with a history of AAA, hypertension, hyperlipidemia, type 2 diabetes mellitus, prostate cancer, intermittent urinary  retention, and CKD stage III who was admitted on 01/04/2021 with chest pain.  Assessment & Plan    Chest Pain - Patient presenting with intermittent chest pain over the last couple of weeks with radiation to left jaw and associated nausea. Currently having 8/10 pain but looks comfortably. Vitals stable. - EKG shows no acute ischemic changes. - High-sensitivity troponin negative x3. - Chest CTA showed no evidence of aortic aneurysm/dissection or PE but did show moderate coronary artery atherosclerosis.  - Echo pending. - Continue aspirin, low dose beta-blocker, and high-intensity statin. - Will order Nitro paste to use if pain does not resolve with sublingual Nitro. - Symptoms concerning for unstable angina. Patient also has multiple CV risk factors. I think he would ultimately benefit from cardiac catheterization. He does have CKD and baseline creatinine 1.5 to 2.1. Creatinine 1.77 on admission and patient had chest CTA overnight. Will repeat BMEt this morning and discuss with MD.  Hypertension - BP well controlled. - Continue Lopressor 12.5mg  twice daily. - Continue to hold home Olmesartan-HCTZ in anticipation for cardiac catheterization.  Hyperlipidemia - Recent lipid panel on 12/06/2020: Total Cholesterol 192, Triglycerides 218, HDL 39, LDL 115.  - On Lipitor 40mg  daily Monday through Friday at home. Increased to 80mg  daily on admission. Agree with this.  Type 2 Diabetes Mellitus  - Hemoglobin A1c 8.4 on 12/06/2020. - Management per primary team.   CKD Stage III - Creatinine 1.77 on admission. Baseline around 1.5 to 2.1.  - Continue to monitor closely.  For questions or updates, please contact [place] Please consult [email or web address] for contact info under        Signed, Darreld Mclean, PA-C  01/05/2021, 7:31 AM    I have examined the patient and reviewed assessment and plan and discussed with patient.  Agree with above as stated.    Called to see patient urgently for  active chest pain.  When I arrived in the room, he was sitting up in the bed with his hand over his chest due to the pain.  It was at about a 7/10 at that point.  He was hoping that the change in position from lying to standing would help relieve the discomfort.  It has not.  Further history shows that he has been having intermittent pain over the last 3 to 4 weeks.  Yesterday, he was at a friend's house and it started and was more continuous.  She called EMS who brought him to the emergency room.  It seems to be getting worse.  Of note, he did have a CT scan which was negative for any dissection but there was evidence of atherosclerosis and calcification in the LAD.  ECG shows poor R wave progression.  The patient does have a history of depression and anxiety.  We spoke about medication compliance.  He does take his medications on a regular basis, but admits to sometimes forgetting his medicines.  I explained to him that if he received a stent, he would have to take Plavix daily and failure to do this could result in stent thrombosis and thus  a heart attack.  I think he will take medicines if a stent is placed.  We further discussed the risks and benefits of cardiac catheterization.  I explained to him about dye potentially causing renal issues.  We will aggressively hydrate him, especially given that he got a CT scan last night.  We will give a 500 cc bolus of normal saline now followed by precath fluids.  Try to minimize dye- no LV gram.  Echocardiogram pending.  Based on the CT scan, he has an atrophied kidney.  He likely has just 1 functioning kidney.  Looking back in the records, his creatinine has ranged anywhere from 1.5-2.1.  He thinks he had a cath many years ago.  He was told he had a minor blockage but nothing that required to be fixed.  He thinks this may have been 20 years ago.  His father died of an MI at age 6.  We discussed stress testing as well but with him having active chest pain, we  cannot do a stress test.  He is getting Nitropaste started as well.  Given that his troponin is negative, I do think we have time to try to hydrate him aggressively.  Further plans based on the cardiac cath result.  He is quite anxious at baseline.  I reassured him that sedation during the procedure would help with that anxiety.  CRITICAL CARE Performed by: Larae Grooms   Total critical care time: 45 minutes  Critical care time was exclusive of separately billable procedures and treating other patients.  Critical care was necessary to treat or prevent imminent or life-threatening deterioration.  Critical care was time spent personally by me on the following activities: development of treatment plan with patient and/or surrogate as well as nursing, discussions with consultants, evaluation of patient's response to treatment, examination of patient, obtaining history from patient or surrogate, ordering and performing treatments and interventions, ordering and review of laboratory studies, ordering and review of radiographic studies, pulse oximetry and re-evaluation of patient's condition.   Larae Grooms

## 2021-01-05 NOTE — ED Notes (Signed)
Pt sitting on the end of the bed, pt has removed all of his monitor leads, replaced leads, pt reports 3/10 chest pain, states that he doesn't want anything for his pain at this time

## 2021-01-05 NOTE — ED Notes (Signed)
Blood drawn from L forearm via 23 gauge butterfly after one unsuccessful attempt

## 2021-01-05 NOTE — ED Notes (Signed)
Cardiology paged to RN per his request

## 2021-01-05 NOTE — Progress Notes (Signed)
   Cath was cancelled today due to patient's concern about contrast dye leading to long-term kidney damage. Patient also stated that if he was found to have a blockage he did not want a stent to be placed because he was not sure if he would be able to remember to take DAPT. This is another reason we should avoid cardiac catheterization. Plan to proceed with [facility] tomorrow. He is NPO at midnight.  Darreld Mclean, PA-C 01/05/2021 4:37 PM

## 2021-01-05 NOTE — ED Notes (Signed)
Pt complaining of 8/10 centralized CP.  It is same pain that has been present since 1900

## 2021-01-05 NOTE — ED Notes (Signed)
Pt in bed with eyes closed, pt arouses to verbal stim, upon arousal pt reports 8/10 chest pain. Resps even and unlabored, pt nsr on monitor.

## 2021-01-05 NOTE — ED Notes (Signed)
Patient transported to CT 

## 2021-01-05 NOTE — Consult Note (Signed)
Cardiology Consultation:   Patient ID: [name] MRN: [id number]; DOB: April 05, 1955  Admit date: 01/04/2021 Date of Consult: 01/05/2021  PCP:  [name], [name]   [facility] Providers Cardiologist:  None        Patient Profile:   [name] is a 65 y.o. male with a hx of prostate cancer with recurrence, intermittent urinary retention, CKD stage IIIa, AAA, type 2 diabetes mellitus on oral hypoglycemics, hyperlipidemia, macular degeneration, essential hypertension who is being seen 01/05/2021 for the evaluation of chest pain at the request of Dr. Marlowe Sax  History of Present Illness:   [name] is a 65 y.o. male with a hx of prostate cancer with recurrence, intermittent urinary retention, CKD stage IIIa, AAA, type 2 diabetes mellitus on oral hypoglycemics, hyperlipidemia, macular degeneration, essential hypertension who is being seen 01/05/2021 for the evaluation of chest pain at the request of Dr. Marlowe Sax. He has had multiple admissions within the past 1-2 years for dizziness, falls etc. He states that he has been having chest pain since many weeks; but they worsened today. The pain radiates to the left jaw; is non exertional. Non-tender. He had episodes of atypical chest pain in prior visits as well. This he claims is different. In the ED, no significant EKG changes. Troponin are flat and normal. He received fentanyl  and morphine in the ED which helped his pain. CT chest showed no PE or dissection but did show coronary calcification. Cardiology was consulted for ACS.   Past Medical History:  Diagnosis Date   Acute kidney injury superimposed on chronic kidney disease ([place]) 02/10/2020   Acute on chronic kidney failure ([place]) 09/28/2019   Closed nondisplaced spiral fracture of shaft of right tibia    Complicated urinary tract infection 09/06/2016   Depression    Diabetes mellitus without complication (HCC)    GERD (gastroesophageal reflux disease)    Gout    right foot    Hemorrhoids    Hypertension    Hypertension    Macular degeneration    Microcytic anemia    Prostate cancer ([place]) 08/2009   Rectal bleeding 07/31/2016   Tubular adenoma     Past Surgical History:  Procedure Laterality Date   CYSTOSCOPY WITH RETROGRADE PYELOGRAM, URETEROSCOPY AND STENT PLACEMENT Right 07/31/2016   Procedure: CYSTOSCOPY WITH RIGHT  RETROGRADE PYELOGRAM, URETEROSCOPY;  Surgeon: [name], MD;  Location: [facility];  Service: Urology;  Laterality: Right;   INGUINAL HERNIA REPAIR     Left ankle joint fusion  1981   PROSTATE BIOPSY     x 2   URETERAL REIMPLANTION  07/31/2016   Procedure: URETERAL REIMPLANT, right boari flap, right psoas hitch;  Surgeon: [name], MD;  Location: [facility];  Service: Urology;;       Inpatient Medications: Scheduled Meds:  Continuous Infusions:  PRN Meds:   Allergies:   No Known Allergies  Social History:   Social History   Socioeconomic History   Marital status: Divorced    Spouse name: Not on file   Number of children: 0   Years of education: Not on file   Highest education level: Not on file  Occupational History   Occupation: retired  Tobacco Use   Smoking status: Never   Smokeless tobacco: Never  Vaping Use   Vaping Use: Never used  Substance and Sexual Activity   Alcohol use: Yes    Comment: ocassionally   Drug use: No   Sexual activity: Yes  Other Topics  Concern   Not on file  Social History Narrative   Not on file   Social Determinants of Health   Financial Resource Strain: Not on file  Food Insecurity: Not on file  Transportation Needs: Not on file  Physical Activity: Not on file  Stress: Not on file  Social Connections: Not on file  Intimate Partner Violence: Not on file     Family History  Problem Relation Age of Onset   Lung cancer Mother        Deceased   Throat cancer Brother    Pancreatic cancer Father    Heart disease Father        Deceased   Lung cancer Maternal Uncle         nephew   Diabetes Other      ROS:  Please see the history of present illness.   All other ROS reviewed and negative.     Physical Exam/Data:   Vitals:   01/04/21 1800 01/04/21 1900 01/04/21 2000 01/04/21 2100  BP: 127/75 (!) 142/93 (!) 148/87 113/61  Pulse: 93 88 81 70  Resp: 16 14 (!) 24 13  Temp:      TempSrc:      SpO2: 96% 95% 97% 96%  Weight:      Height:        Intake/Output Summary (Last 24 hours) at 01/05/2021 0106 Last data filed at 01/04/2021 2005 Gross per 24 hour  Intake 1000 ml  Output --  Net 1000 ml   Last 3 Weights 01/04/2021 12/12/2020 12/06/2020  Weight (lbs) 175 lb 178 lb 178 lb 9.6 oz  Weight (kg) 79.379 kg 80.74 kg 81.012 kg     Body mass index is 28.25 kg/m.  General:  Well nourished, well developed, mild distress HEENT: normal Neck: no JVD Vascular: No carotid bruits; Distal pulses 2+ bilaterally Cardiac:  normal S1, S2; RRR; no murmur  Lungs: Poor airflow Abd: soft, nontender, no hepatomegaly  Ext: no edema Musculoskeletal:  No deformities, BUE and BLE strength normal and equal Skin: warm and dry  Neuro:  CNs 2-12 intact, no focal abnormalities noted Psych:  Normal affect   EKG:  The EKG was personally reviewed and demonstrates: NSR and no significant new changes   Laboratory Data:  High Sensitivity Troponin:   Recent Labs  Lab 01/04/21 1650 01/04/21 1754  TROPONINIHS 8 7     Chemistry Recent Labs  Lab 01/04/21 1650  NA 135  K 4.6  CL 100  CO2 23  GLUCOSE 217*  BUN 41*  CREATININE 1.77*  CALCIUM 9.7  GFRNONAA 42*  ANIONGAP 12    No results for input(s): PROT, ALBUMIN, AST, ALT, ALKPHOS, BILITOT in the last 168 hours. Lipids No results for input(s): CHOL, TRIG, HDL, LABVLDL, LDLCALC, CHOLHDL in the last 168 hours.  Hematology Recent Labs  Lab 01/04/21 1650  WBC 10.0  RBC 6.23*  HGB 18.0*  HCT 53.8*  MCV 86.4  MCH 28.9  MCHC 33.5  RDW 13.1  PLT 223   Thyroid No results for input(s): TSH, FREET4 in  the last 168 hours.  BNP Recent Labs  Lab 01/04/21 1554  BNP 13.2    DDimer No results for input(s): DDIMER in the last 168 hours.   Radiology/Studies:  DG Chest 2 View  Result Date: 01/04/2021 CLINICAL DATA:  Chest pain EXAM: CHEST - 2 VIEW COMPARISON:  Chest radiograph 11/11/2020 FINDINGS: The cardiomediastinal silhouette is stable. There is no focal consolidation or pulmonary edema. There is  no pleural effusion or pneumothorax. Scarring in the lateral left base is unchanged. There is no acute osseous abnormality. IMPRESSION: No radiographic evidence of acute cardiopulmonary process. Electronically Signed   By: Valetta Mole M.D.   On: 01/04/2021 17:00   CT Angio Chest/Abd/Pel for Dissection W and/or Wo Contrast  Result Date: 01/04/2021 CLINICAL DATA:  Chest and back pain EXAM: CT ANGIOGRAPHY CHEST, ABDOMEN AND PELVIS TECHNIQUE: Non-contrast CT of the chest was initially obtained. Multidetector CT imaging through the chest, abdomen and pelvis was performed using the standard protocol during bolus administration of intravenous contrast. Multiplanar reconstructed images and MIPs were obtained and reviewed to evaluate the vascular anatomy. CONTRAST:  60mL OMNIPAQUE IOHEXOL 350 MG/ML SOLN COMPARISON:  01/06/2021, 10/25/2020 FINDINGS: CTA CHEST FINDINGS Cardiovascular: The heart is unremarkable without pericardial effusion. No evidence of thoracic aortic aneurysm or dissection. No evidence of pulmonary embolus. There is moderate atherosclerosis throughout the LAD distribution of the coronary vasculature. Mediastinum/Nodes: No enlarged mediastinal, hilar, or axillary lymph nodes. Thyroid gland, trachea, and esophagus demonstrate no significant findings. Lungs/Pleura: No acute airspace disease, effusion, or pneumothorax. Central airways are patent. Musculoskeletal: No acute or destructive bony lesions. Reconstructed images demonstrate no additional findings. Review of the MIP images confirms the above  findings. CTA ABDOMEN AND PELVIS FINDINGS VASCULAR Aorta: Normal caliber aorta without aneurysm, dissection, vasculitis or significant stenosis. Celiac: Patent without evidence of aneurysm, dissection, vasculitis or significant stenosis. SMA: Eccentric atheromatous plaque at the origin of the SMA is chronic, with approximate 50% stenosis. No evidence of acute dissection or vasculitis. No evidence of aneurysm. Renals: Both renal arteries are patent without evidence of aneurysm, dissection, vasculitis, fibromuscular dysplasia or significant stenosis. IMA: Patent without evidence of aneurysm, dissection, vasculitis or significant stenosis. Mild narrowing at the origin unchanged. Inflow: Patent without evidence of aneurysm, dissection, vasculitis or significant stenosis. Mild atherosclerosis. Veins: No obvious venous abnormality within the limitations of this arterial phase study. Review of the MIP images confirms the above findings. NON-VASCULAR Hepatobiliary: Small calcified gallstones are identified without cholecystitis. No focal liver abnormality. Pancreas: Unremarkable. No pancreatic ductal dilatation or surrounding inflammatory changes. Spleen: Normal in size without focal abnormality. Adrenals/Urinary Tract: Postsurgical changes are seen from prior right ureteral reimplantation. There is persistent right-sided hydronephrosis and hydroureter, with severe right renal cortical atrophy unchanged. Stable left kidney, with multiple renal cysts again noted. No left-sided obstruction. Bladder is stable. The adrenals are unremarkable. Stomach/Bowel: No bowel obstruction or ileus. Normal appendix right lower quadrant. Scattered colonic diverticulosis without diverticulitis. Lymphatic: No pathologic adenopathy within the abdomen or pelvis. Reproductive: Stable enlargement of the prostate. Other: No free fluid or free gas. Stable fat containing umbilical hernias. Musculoskeletal: No acute or destructive bony lesions.  Reconstructed images demonstrate no additional findings. Review of the MIP images confirms the above findings. IMPRESSION: 1. No evidence of thoracoabdominal aortic aneurysm or dissection. 2. No evidence of pulmonary embolus. 3. Moderate coronary artery atherosclerosis. 4. Cholelithiasis without cholecystitis. 5. Chronic right renal atrophy with stable right hydronephrosis and hydroureter related to prior right ureteral surgery for reimplantation. 6. Stable enlargement of the prostate. 7.  Aortic Atherosclerosis (ICD10-I70.0). Electronically Signed   By: Randa Ngo M.D.   On: 01/04/2021 21:46     Assessment and Plan:   # Chest pain # HTN # DM # Possible ACS  -Will repeat EKG. Continue to get serial EKG -Echo in AM to see for any WMA -Load with aspirin 325 followed by aspirin 81 daily -Atorvastatin 80 daily -Metoprolol 12.5 BID -  NPO at midnight for potential LHC (considering his kidney function would avoid it but remains significant chest pain will get LHC) vs stress test (if chest pain gets better) in AM  For questions or updates, please contact [place] Please consult [email or web address] for contact info under    Signed, Jaci Lazier, MD  01/05/2021 1:06 AM

## 2021-01-05 NOTE — ED Notes (Signed)
Pt provided his 3rd and final milk and some water.

## 2021-01-05 NOTE — H&P (Signed)
History and Physical    [name] HKV:[id number] DOB: 06-23-1955 DOA: 01/04/2021  PCP: [name], [name] Patient coming from: Home  Chief Complaint: Chest pain  HPI: [name] is a 65 y.o. male with medical history significant of CKD stage IIIa, AAA, non-insulin-dependent type II diabetes, hypertension, hyperlipidemia, prostate cancer, recurrent urinary obstruction, alcohol abuse presented to the ED with a chief complaint of chest pain.  Hemodynamically stable.  Labs showing WBC 10.0, hemoglobin 18.0 (chronically elevated), platelet count 223k.  Sodium 135, potassium 4.6, chloride 100, bicarb 23, BUN 41, creatinine 1.7 (stable), glucose 217.  EKG done twice without acute ischemic changes and high-sensitivity troponin x2 negative.  COVID and influenza PCR negative.  CTA negative for PE or dissection but did show moderate coronary artery atherosclerosis..  Patient continued to have ongoing chest pain.  Cardiology consulted. Patient was given Dilaudid, sublingual nitroglycerin, Zofran, multiple doses of fentanyl, and 500 cc fluid bolus.  Patient reports intermittent episodes of chest pain for several months.  He describes it as central/left-sided chest pain which radiates to his neck.  Chest pain can occur at rest or with exertion.  Associated with palpitations, dyspnea, and nausea.  Each episode lasts a few seconds to minutes.  Yesterday he had an episode while doing some light yard work.  At night he had another episode of chest pain at rest which prompted him to come into the ED to be evaluated.  States his father died from a heart attack.  Denies fevers or cough.  Continues to have chest pain but sublingual nitroglycerin which he received earlier did help.  Review of Systems:  All systems reviewed and apart from history of presenting illness, are negative.  Past Medical History:  Diagnosis Date   Acute kidney injury superimposed on chronic kidney disease ([name]) 02/10/2020   Acute on chronic  kidney failure ([name]) 09/28/2019   Closed nondisplaced spiral fracture of shaft of right tibia    Complicated urinary tract infection 09/06/2016   Depression    Diabetes mellitus without complication (HCC)    GERD (gastroesophageal reflux disease)    Gout    right foot   Hemorrhoids    Hypertension    Hypertension    Macular degeneration    Microcytic anemia    Prostate cancer ([name]) 08/2009   Rectal bleeding 07/31/2016   Tubular adenoma     Past Surgical History:  Procedure Laterality Date   CYSTOSCOPY WITH RETROGRADE PYELOGRAM, URETEROSCOPY AND STENT PLACEMENT Right 07/31/2016   Procedure: CYSTOSCOPY WITH RIGHT  RETROGRADE PYELOGRAM, URETEROSCOPY;  Surgeon: [name], MD;  Location: [facility];  Service: Urology;  Laterality: Right;   INGUINAL HERNIA REPAIR     Left ankle joint fusion  1981   PROSTATE BIOPSY     x 2   URETERAL REIMPLANTION  07/31/2016   Procedure: URETERAL REIMPLANT, right boari flap, right psoas hitch;  Surgeon: [name], MD;  Location: [facility];  Service: Urology;;     reports that he has never smoked. He has never used smokeless tobacco. He reports current alcohol use. He reports that he does not use drugs.  No Known Allergies  Family History  Problem Relation Age of Onset   Lung cancer Mother        Deceased   Throat cancer Brother    Pancreatic cancer Father    Heart disease Father        Deceased   Lung cancer Maternal Uncle  nephew   Diabetes Other     Prior to Admission medications   Medication Sig Start Date End Date Taking? Authorizing Provider  acetaminophen (TYLENOL) 325 MG tablet Take 2 tablets (650 mg total) by mouth every 6 (six) hours as needed for mild pain (or Fever >/= 101). 02/12/20   Domenic Polite, MD  alfuzosin (UROXATRAL) 10 MG 24 hr tablet Take 1 tablet (10 mg total) by mouth daily with breakfast. 11/15/20   Hollice Espy, MD  ARIPiprazole (ABILIFY) 2 MG tablet TAKE 1 TABLET BY MOUTH EVERY DAY 01/03/21    [name], [name]  aspirin EC 81 MG tablet Take 81 mg by mouth daily with breakfast.     [provider]  atorvastatin (LIPITOR) 40 MG tablet 1 tablet by mouth Monday - Friday 12/06/20   [name], [name]  clotrimazole-betamethasone (LOTRISONE) cream Apply 1 application topically 2 (two) times daily. 07/17/20   [name], [name]  diclofenac Sodium (VOLTAREN) 1 % GEL Apply 4 g topically 4 (four) times daily. 09/03/20   Palumbo, April, MD  empagliflozin (JARDIANCE) 10 MG TABS tablet Take 10 mg by mouth daily. Take 1 tablet by mouth daily    [provider]  finasteride (PROSCAR) 5 MG tablet Take 1 tablet (5 mg total) by mouth daily. 12/12/20   Hollice Espy, MD  metoprolol tartrate (LOPRESSOR) 50 MG tablet Take 1 tablet (50 mg total) by mouth 2 (two) times daily. 12/06/20   [name], [name]  nystatin (NYSTATIN) powder Apply 1 application topically 3 (three) times daily. 08/26/19   [name], [name]  olmesartan-hydrochlorothiazide (BENICAR HCT) 40-25 MG tablet Take 1 tablet by mouth daily. 09/20/20   Geradine Girt, DO  Semaglutide (RYBELSUS) 14 MG TABS Take 1 tablet by mouth daily. Take 30 minutes before breakfast 12/06/20   [name], [name]  sildenafil (REVATIO) 20 MG tablet Take by mouth. 11/30/20   [provider]  Tetrahydrozoline HCl (VISINE OP) Place 1 drop into both eyes daily as needed (itching/irritation).    [provider]  traMADol (ULTRAM) 50 MG tablet Take 1 tablet (50 mg total) by mouth every 6 (six) hours as needed for severe pain. 08/03/20   Raiford Noble Latif, DO  traZODone (DESYREL) 50 MG tablet TAKE 1 TABLET (50 MG TOTAL) BY MOUTH AT BEDTIME AS NEEDED FOR SLEEP. 06/05/20   [name], [name]    Physical Exam: Vitals:   01/05/21 0755 01/05/21 0800 01/05/21 0815 01/05/21 0820  BP: 110/68 116/90 (!) 135/95 128/79  Pulse: 64 67 71 63  Resp: 12 16 17 14   Temp:      TempSrc:      SpO2: 98% 99% 99% 100%  Weight:      Height:        Physical  Exam Constitutional:      General: He is not in acute distress. HENT:     Head: Normocephalic and atraumatic.  Eyes:     Extraocular Movements: Extraocular movements intact.     Conjunctiva/sclera: Conjunctivae normal.  Cardiovascular:     Rate and Rhythm: Normal rate and regular rhythm.     Pulses: Normal pulses.  Pulmonary:     Effort: Pulmonary effort is normal. No respiratory distress.     Breath sounds: Normal breath sounds. No wheezing or rales.  Abdominal:     General: Bowel sounds are normal. There is no distension.     Palpations: Abdomen is soft.     Tenderness: There is no abdominal tenderness.  Musculoskeletal:  General: No swelling or tenderness.     Cervical back: Normal range of motion and neck supple.  Skin:    General: Skin is warm and dry.  Neurological:     General: No focal deficit present.     Mental Status: He is alert and oriented to person, place, and time.     Labs on Admission: I have personally reviewed following labs and imaging studies  CBC: Recent Labs  Lab 01/04/21 1650  WBC 10.0  NEUTROABS 8.3*  HGB 18.0*  HCT 53.8*  MCV 86.4  PLT 263   Basic Metabolic Panel: Recent Labs  Lab 01/04/21 1650  NA 135  K 4.6  CL 100  CO2 23  GLUCOSE 217*  BUN 41*  CREATININE 1.77*  CALCIUM 9.7   GFR: Estimated Creatinine Clearance: 40.6 mL/min (A) (by C-G formula based on SCr of 1.77 mg/dL (H)). Liver Function Tests: No results for input(s): AST, ALT, ALKPHOS, BILITOT, PROT, ALBUMIN in the last 168 hours. No results for input(s): LIPASE, AMYLASE in the last 168 hours. No results for input(s): AMMONIA in the last 168 hours. Coagulation Profile: No results for input(s): INR, PROTIME in the last 168 hours. Cardiac Enzymes: No results for input(s): CKTOTAL, CKMB, CKMBINDEX, TROPONINI in the last 168 hours. BNP (last 3 results) No results for input(s): PROBNP in the last 8760 hours. HbA1C: No results for input(s): HGBA1C in the last 72  hours. CBG: Recent Labs  Lab 01/05/21 0406 01/05/21 0819  GLUCAP 259* 113*   Lipid Profile: No results for input(s): CHOL, HDL, LDLCALC, TRIG, CHOLHDL, LDLDIRECT in the last 72 hours. Thyroid Function Tests: No results for input(s): TSH, T4TOTAL, FREET4, T3FREE, THYROIDAB in the last 72 hours. Anemia Panel: No results for input(s): VITAMINB12, FOLATE, FERRITIN, TIBC, IRON, RETICCTPCT in the last 72 hours. Urine analysis:    Component Value Date/Time   COLORURINE YELLOW 11/11/2020 1325   APPEARANCEUR Clear 12/12/2020 1046   LABSPEC 1.016 11/11/2020 1325   PHURINE 6.0 11/11/2020 1325   GLUCOSEU 2+ (A) 12/12/2020 1046   HGBUR NEGATIVE 11/11/2020 1325   BILIRUBINUR Negative 12/12/2020 1046   KETONESUR NEGATIVE 11/11/2020 1325   PROTEINUR 2+ (A) 12/12/2020 1046   PROTEINUR 30 (A) 11/11/2020 1325   UROBILINOGEN 0.2 08/26/2019 1516   UROBILINOGEN 0.2 04/18/2008 1153   NITRITE Negative 12/12/2020 1046   NITRITE NEGATIVE 11/11/2020 1325   LEUKOCYTESUR Negative 12/12/2020 1046   LEUKOCYTESUR NEGATIVE 11/11/2020 1325    Radiological Exams on Admission: DG Chest 2 View  Result Date: 01/04/2021 CLINICAL DATA:  Chest pain EXAM: CHEST - 2 VIEW COMPARISON:  Chest radiograph 11/11/2020 FINDINGS: The cardiomediastinal silhouette is stable. There is no focal consolidation or pulmonary edema. There is no pleural effusion or pneumothorax. Scarring in the lateral left base is unchanged. There is no acute osseous abnormality. IMPRESSION: No radiographic evidence of acute cardiopulmonary process. Electronically Signed   By: Valetta Mole M.D.   On: 01/04/2021 17:00   CT Angio Chest/Abd/Pel for Dissection W and/or Wo Contrast  Result Date: 01/04/2021 CLINICAL DATA:  Chest and back pain EXAM: CT ANGIOGRAPHY CHEST, ABDOMEN AND PELVIS TECHNIQUE: Non-contrast CT of the chest was initially obtained. Multidetector CT imaging through the chest, abdomen and pelvis was performed using the standard protocol  during bolus administration of intravenous contrast. Multiplanar reconstructed images and MIPs were obtained and reviewed to evaluate the vascular anatomy. CONTRAST:  78mL OMNIPAQUE IOHEXOL 350 MG/ML SOLN COMPARISON:  01/06/2021, 10/25/2020 FINDINGS: CTA CHEST FINDINGS Cardiovascular: The heart is unremarkable  without pericardial effusion. No evidence of thoracic aortic aneurysm or dissection. No evidence of pulmonary embolus. There is moderate atherosclerosis throughout the LAD distribution of the coronary vasculature. Mediastinum/Nodes: No enlarged mediastinal, hilar, or axillary lymph nodes. Thyroid gland, trachea, and esophagus demonstrate no significant findings. Lungs/Pleura: No acute airspace disease, effusion, or pneumothorax. Central airways are patent. Musculoskeletal: No acute or destructive bony lesions. Reconstructed images demonstrate no additional findings. Review of the MIP images confirms the above findings. CTA ABDOMEN AND PELVIS FINDINGS VASCULAR Aorta: Normal caliber aorta without aneurysm, dissection, vasculitis or significant stenosis. Celiac: Patent without evidence of aneurysm, dissection, vasculitis or significant stenosis. SMA: Eccentric atheromatous plaque at the origin of the SMA is chronic, with approximate 50% stenosis. No evidence of acute dissection or vasculitis. No evidence of aneurysm. Renals: Both renal arteries are patent without evidence of aneurysm, dissection, vasculitis, fibromuscular dysplasia or significant stenosis. IMA: Patent without evidence of aneurysm, dissection, vasculitis or significant stenosis. Mild narrowing at the origin unchanged. Inflow: Patent without evidence of aneurysm, dissection, vasculitis or significant stenosis. Mild atherosclerosis. Veins: No obvious venous abnormality within the limitations of this arterial phase study. Review of the MIP images confirms the above findings. NON-VASCULAR Hepatobiliary: Small calcified gallstones are identified  without cholecystitis. No focal liver abnormality. Pancreas: Unremarkable. No pancreatic ductal dilatation or surrounding inflammatory changes. Spleen: Normal in size without focal abnormality. Adrenals/Urinary Tract: Postsurgical changes are seen from prior right ureteral reimplantation. There is persistent right-sided hydronephrosis and hydroureter, with severe right renal cortical atrophy unchanged. Stable left kidney, with multiple renal cysts again noted. No left-sided obstruction. Bladder is stable. The adrenals are unremarkable. Stomach/Bowel: No bowel obstruction or ileus. Normal appendix right lower quadrant. Scattered colonic diverticulosis without diverticulitis. Lymphatic: No pathologic adenopathy within the abdomen or pelvis. Reproductive: Stable enlargement of the prostate. Other: No free fluid or free gas. Stable fat containing umbilical hernias. Musculoskeletal: No acute or destructive bony lesions. Reconstructed images demonstrate no additional findings. Review of the MIP images confirms the above findings. IMPRESSION: 1. No evidence of thoracoabdominal aortic aneurysm or dissection. 2. No evidence of pulmonary embolus. 3. Moderate coronary artery atherosclerosis. 4. Cholelithiasis without cholecystitis. 5. Chronic right renal atrophy with stable right hydronephrosis and hydroureter related to prior right ureteral surgery for reimplantation. 6. Stable enlargement of the prostate. 7.  Aortic Atherosclerosis (ICD10-I70.0). Electronically Signed   By: Randa Ngo M.D.   On: 01/04/2021 21:46    EKG: Independently reviewed.  Sinus rhythm, no acute ischemic changes.  Assessment/Plan Principal Problem:   Chest pain Active Problems:   Benign hypertension   Chronic kidney disease (CKD), stage III (moderate) (HCC)   Type 2 diabetes mellitus (HCC)   HLD (hyperlipidemia)   Chest pain/ unstable angina Has risk factors for CAD and history concerning.  Continues to have chest pain but did  experience intermittent relief with sublingual nitroglycerin.  High-sensitivity troponin negative x3.  EKG x3 negative for acute ischemic changes. CTA negative for PE or dissection but did show moderate coronary artery atherosclerosis. -Cardiology following, appreciate recommendations.  Serial EKGs.  Echocardiogram to assess for wall motion abnormalities. Aspirin 324 mg followed by aspirin 81 mg daily, atorvastatin 80 mg daily, and metoprolol 12.5 mg twice daily.  Sublingual nitroglycerin as needed.  Keep n.p.o. for potential left heart cath versus stress test.  CKD stage IIIa Stable.  Creatinine appears to be at baseline.  Non-insulin-dependent type 2 diabetes -Sliding scale insulin sensitive every 4 hours as patient is currently NPO.  Hold oral hypoglycemic agents.  Hypertension Stable. -Continue metoprolol  Hyperlipidemia -Continue statin  Alcohol abuse No signs of withdrawal at this time. -CIWA protocol; Ativan as needed.  Thiamine, folate, and multivitamin.  Chronic polycythemia -Check erythropoietin level  DVT prophylaxis: Subcutaneous heparin Code Status: Patient wishes to be full code. Family Communication: Diagnostic findings and treatment plan discussed with the patient.  No family available at this time. Disposition Plan: Status is: Observation  The patient remains OBS appropriate and will d/c before 2 midnights.  Level of care: Level of care: Telemetry Cardiac  The medical decision making on this patient was of high complexity and the patient is at high risk for clinical deterioration, therefore this is a level 3 visit.  Shela Leff MD [facility]  If 7PM-7AM, please contact night-coverage [email or web address]  01/05/2021, 8:57 AM

## 2021-01-05 NOTE — ED Provider Notes (Signed)
Blood pressure 113/61, pulse 70, temperature 98.1 F (36.7 C), temperature source Oral, resp. rate 13, height 5\' 6"  (1.676 m), weight 79.4 kg, SpO2 96 %.  Assuming care from Dr. Roslynn Amble.  In short, [name] is a 65 y.o. male with a chief complaint of Chest Pain .  Refer to the original H&P for additional details.  Discussed patient's case with [facility] to request admission. Patient and family (if present) updated with plan. Care transferred to [facility] service.  I reviewed all nursing notes, vitals, pertinent old records, EKGs, labs, imaging (as available).  Spoke with the Cardiology team, Dr. Humphrey Rolls. He will see the patient and leave recommendations for the admit team.    EKG Interpretation  Date/Time:  Thursday January 04 2021 15:53:00 EDT Ventricular Rate:  110 PR Interval:  129 QRS Duration: 80 QT Interval:  300 QTC Calculation: 406 R Axis:   -38 Text Interpretation: Sinus tachycardia Inferior infarct, old Consider anterior infarct no acute change when compared to prior Confirmed by Madalyn Rob [phone]) on 01/04/2021 3:55:16 PM            Christal Lagerstrom, [facility], MD 01/05/21 [phone]

## 2021-01-05 NOTE — ED Notes (Signed)
Pt had an accident with urination that required changing of the bed and gown.  Pt was able to stand at bedside while linens and gown were changed.

## 2021-01-05 NOTE — Progress Notes (Signed)
Patient ID: [name], male   DOB: October 30, 1955, 65 y.o.   MRN: [id number]  PROGRESS NOTE    [name]  MOQ:[id number] DOB: [birth year] DOA: 01/04/2021 PCP: [name], [name]    Brief Narrative:  Patient is 65 year old male with history of T2DM, HTN, HLD, chronic kidney disease stage IIIa, prostate cancer, recurrent urinary obstruction, history of alcohol abuse who presents with significant chest pain.  Patient had negative troponins and no evidence of acute MI but given risk factors was admitted for presumed unstable angina.  Patient had a negative work-up for AAA or dissection, PE.  Assessment & Plan:   Principal Problem:   Chest pain Active Problems:   Benign hypertension   Chronic kidney disease (CKD), stage III (moderate) (HCC)   Type 2 diabetes mellitus (HCC)   HLD (hyperlipidemia)  Chest pain/unstable angina Has risk factors for CAD and history concerning.  Chest pain is now resolved high-sensitivity troponin negative x3.  EKG x3 negative for acute ischemic changes.  CTA negative for PE or dissection but did show moderate coronary artery atherosclerosis. -Cardiology following, appreciate recommendations.  Serial EKGs.  Echocardiogram to assess for wall motion abnormalities. Aspirin 324 mg followed by aspirin 81 mg daily, atorvastatin 80 mg daily, and metoprolol 12.5 mg twice daily.  Sublingual nitroglycerin as needed.   Patient was offered and scheduled for cardiac cath today however he declined to have that done due to the risk of contrast on his kidneys.  He would look for nuclear medicine scan in the morning.   CKD stage IIIa-worsening Stable.  Creatinine appears to be at baseline. Avoid nephrotoxic agents   Non-insulin-dependent type 2 diabetes (stable) -Recent A1c was 8.4 -Resume home medications Sliding scale insulin   Hypertension Stable. -Continue metoprolol   Hyperlipidemia (stable) -Continue statin   Alcohol abuse No signs of withdrawal at this  time. -CIWA protocol; Ativan as needed.  Thiamine, folate, and multivitamin.   Chronic polycythemia (worsening) -Check erythropoietin level   DVT prophylaxis: Heparin SQ Code Status: Full code  Family Communication: Patient at bedside Disposition Plan: Observation patient remains in observation status pending completion of work-up and ability to rule out life-threatening medical problem.   Consultants:  Cardiology  Procedures: None  Antimicrobials: Anti-infectives (From admission, onward)    None       Subjective: Patient reports his chest pain has gone away.  Objective: Vitals:   01/05/21 0815 01/05/21 0820 01/05/21 0925 01/05/21 1102  BP: (!) 135/95 128/79 125/83 137/86  Pulse: 71 63 74 68  Resp: 17 14 16 17   Temp:    (!) 97.5 F (36.4 C)  TempSrc:    Oral  SpO2: 99% 100% 98% 98%  Weight:      Height:        Intake/Output Summary (Last 24 hours) at 01/05/2021 1259 Last data filed at 01/05/2021 1250 Gross per 24 hour  Intake 1813.63 ml  Output --  Net 1813.63 ml   Filed Weights   01/04/21 1551  Weight: 79.4 kg    Examination:  General exam: Appears calm and comfortable  Respiratory system: Clear to auscultation. Respiratory effort normal. Cardiovascular system: S1 & S2 heard, RRR.  Gastrointestinal system: Abdomen is nondistended, soft and nontender.  Central nervous system: Alert and oriented. No focal neurological deficits. Extremities: Symmetric  Skin: No rashes Psychiatry: Judgement and insight appear normal. Mood & affect appropriate.     Data Reviewed: I have personally reviewed following labs and imaging studies  CBC: Recent Labs  Lab 01/04/21 1650  WBC 10.0  NEUTROABS 8.3*  HGB 18.0*  HCT 53.8*  MCV 86.4  PLT 151   Basic Metabolic Panel: Recent Labs  Lab 01/04/21 1650  NA 135  K 4.6  CL 100  CO2 23  GLUCOSE 217*  BUN 41*  CREATININE 1.77*  CALCIUM 9.7    CBG: Recent Labs  Lab 01/05/21 0406 01/05/21 0819  01/05/21 0915 01/05/21 1119  GLUCAP 259* 113* 159* 144*     Recent Results (from the past 240 hour(s))  Resp Panel by RT-PCR (Flu A&B, Covid) Nasopharyngeal Swab     Status: None   Collection Time: 01/05/21  1:49 AM   Specimen: Nasopharyngeal Swab; Nasopharyngeal(NP) swabs in vial transport medium  Result Value Ref Range Status   SARS Coronavirus 2 by RT PCR NEGATIVE NEGATIVE Final    Comment: (NOTE) SARS-CoV-2 target nucleic acids are NOT DETECTED.  The SARS-CoV-2 RNA is generally detectable in upper respiratory specimens during the acute phase of infection. The lowest concentration of SARS-CoV-2 viral copies this assay can detect is 138 copies/mL. A negative result does not preclude SARS-Cov-2 infection and should not be used as the sole basis for treatment or other patient management decisions. A negative result may occur with  improper specimen collection/handling, submission of specimen other than nasopharyngeal swab, presence of viral mutation(s) within the areas targeted by this assay, and inadequate number of viral copies(<138 copies/mL). A negative result must be combined with clinical observations, patient history, and epidemiological information. The expected result is Negative.  Fact Sheet for Patients:  EntrepreneurPulse.com.au  Fact Sheet for Healthcare Providers:  IncredibleEmployment.be  This test is no t yet approved or cleared by the [place] FDA and  has been authorized for detection and/or diagnosis of SARS-CoV-2 by FDA under an Emergency Use Authorization (EUA). This EUA will remain  in effect (meaning this test can be used) for the duration of the COVID-19 declaration under Section 564(b)(1) of the Act, 21 U.S.C.section 360bbb-3(b)(1), unless the authorization is terminated  or revoked sooner.       Influenza A by PCR NEGATIVE NEGATIVE Final   Influenza B by PCR NEGATIVE NEGATIVE Final    Comment: (NOTE) The  Xpert Xpress SARS-CoV-2/FLU/RSV plus assay is intended as an aid in the diagnosis of influenza from Nasopharyngeal swab specimens and should not be used as a sole basis for treatment. Nasal washings and aspirates are unacceptable for Xpert Xpress SARS-CoV-2/FLU/RSV testing.  Fact Sheet for Patients: EntrepreneurPulse.com.au  Fact Sheet for Healthcare Providers: IncredibleEmployment.be  This test is not yet approved or cleared by the [place] FDA and has been authorized for detection and/or diagnosis of SARS-CoV-2 by FDA under an Emergency Use Authorization (EUA). This EUA will remain in effect (meaning this test can be used) for the duration of the COVID-19 declaration under Section 564(b)(1) of the Act, 21 U.S.C. section 360bbb-3(b)(1), unless the authorization is terminated or revoked.  Performed at [facility], [place] [address], [place], [place] [address]       Radiology Studies: DG Chest 2 View  Result Date: 01/04/2021 CLINICAL DATA:  Chest pain EXAM: CHEST - 2 VIEW COMPARISON:  Chest radiograph 11/11/2020 FINDINGS: The cardiomediastinal silhouette is stable. There is no focal consolidation or pulmonary edema. There is no pleural effusion or pneumothorax. Scarring in the lateral left base is unchanged. There is no acute osseous abnormality. IMPRESSION: No radiographic evidence of acute cardiopulmonary process. Electronically Signed   By: Court Joy.D.  On: 01/04/2021 17:00   CT Angio Chest/Abd/Pel for Dissection W and/or Wo Contrast  Result Date: 01/04/2021 CLINICAL DATA:  Chest and back pain EXAM: CT ANGIOGRAPHY CHEST, ABDOMEN AND PELVIS TECHNIQUE: Non-contrast CT of the chest was initially obtained. Multidetector CT imaging through the chest, abdomen and pelvis was performed using the standard protocol during bolus administration of intravenous contrast. Multiplanar reconstructed images and MIPs were obtained and reviewed to  evaluate the vascular anatomy. CONTRAST:  78mL OMNIPAQUE IOHEXOL 350 MG/ML SOLN COMPARISON:  01/06/2021, 10/25/2020 FINDINGS: CTA CHEST FINDINGS Cardiovascular: The heart is unremarkable without pericardial effusion. No evidence of thoracic aortic aneurysm or dissection. No evidence of pulmonary embolus. There is moderate atherosclerosis throughout the LAD distribution of the coronary vasculature. Mediastinum/Nodes: No enlarged mediastinal, hilar, or axillary lymph nodes. Thyroid gland, trachea, and esophagus demonstrate no significant findings. Lungs/Pleura: No acute airspace disease, effusion, or pneumothorax. Central airways are patent. Musculoskeletal: No acute or destructive bony lesions. Reconstructed images demonstrate no additional findings. Review of the MIP images confirms the above findings. CTA ABDOMEN AND PELVIS FINDINGS VASCULAR Aorta: Normal caliber aorta without aneurysm, dissection, vasculitis or significant stenosis. Celiac: Patent without evidence of aneurysm, dissection, vasculitis or significant stenosis. SMA: Eccentric atheromatous plaque at the origin of the SMA is chronic, with approximate 50% stenosis. No evidence of acute dissection or vasculitis. No evidence of aneurysm. Renals: Both renal arteries are patent without evidence of aneurysm, dissection, vasculitis, fibromuscular dysplasia or significant stenosis. IMA: Patent without evidence of aneurysm, dissection, vasculitis or significant stenosis. Mild narrowing at the origin unchanged. Inflow: Patent without evidence of aneurysm, dissection, vasculitis or significant stenosis. Mild atherosclerosis. Veins: No obvious venous abnormality within the limitations of this arterial phase study. Review of the MIP images confirms the above findings. NON-VASCULAR Hepatobiliary: Small calcified gallstones are identified without cholecystitis. No focal liver abnormality. Pancreas: Unremarkable. No pancreatic ductal dilatation or surrounding  inflammatory changes. Spleen: Normal in size without focal abnormality. Adrenals/Urinary Tract: Postsurgical changes are seen from prior right ureteral reimplantation. There is persistent right-sided hydronephrosis and hydroureter, with severe right renal cortical atrophy unchanged. Stable left kidney, with multiple renal cysts again noted. No left-sided obstruction. Bladder is stable. The adrenals are unremarkable. Stomach/Bowel: No bowel obstruction or ileus. Normal appendix right lower quadrant. Scattered colonic diverticulosis without diverticulitis. Lymphatic: No pathologic adenopathy within the abdomen or pelvis. Reproductive: Stable enlargement of the prostate. Other: No free fluid or free gas. Stable fat containing umbilical hernias. Musculoskeletal: No acute or destructive bony lesions. Reconstructed images demonstrate no additional findings. Review of the MIP images confirms the above findings. IMPRESSION: 1. No evidence of thoracoabdominal aortic aneurysm or dissection. 2. No evidence of pulmonary embolus. 3. Moderate coronary artery atherosclerosis. 4. Cholelithiasis without cholecystitis. 5. Chronic right renal atrophy with stable right hydronephrosis and hydroureter related to prior right ureteral surgery for reimplantation. 6. Stable enlargement of the prostate. 7.  Aortic Atherosclerosis (ICD10-I70.0). Electronically Signed   By: Randa Ngo M.D.   On: 01/04/2021 21:46     Scheduled Meds:  [START ON 01/06/2021] aspirin EC  81 mg Oral Daily   atorvastatin  80 mg Oral Daily   folic acid  1 mg Oral Daily   heparin injection (subcutaneous)  5,000 Units Subcutaneous Q8H   insulin aspart  0-9 Units Subcutaneous Q4H   metoprolol tartrate  12.5 mg Oral BID   multivitamin with minerals  1 tablet Oral Daily   thiamine  100 mg Oral Daily   Or   thiamine  100 mg  Intravenous Daily   Continuous Infusions:  sodium chloride 1 mL/kg/hr (01/05/21 1251)     LOS: 0 days    Donnamae Jude,  MD 01/05/2021 12:59 PM [phone] [facility] If 7PM-7AM, please contact night-coverage 01/05/2021, 12:59 PM

## 2021-01-05 NOTE — ED Notes (Signed)
Cath lab called for pt, pt states that he is anxious and doesn't want to get a cath at this time, pt talking on the phone with cards PA, pt states that she doesn't feel he can take medications if a stent is placed.  Pt refused cath at this time.  Ecco at bedside.

## 2021-01-06 ENCOUNTER — Observation Stay (HOSPITAL_COMMUNITY): Payer: Medicare Other

## 2021-01-06 ENCOUNTER — Inpatient Hospital Stay: Admission: RE | Admit: 2021-01-06 | Payer: Medicare Other | Source: Ambulatory Visit

## 2021-01-06 DIAGNOSIS — I129 Hypertensive chronic kidney disease with stage 1 through stage 4 chronic kidney disease, or unspecified chronic kidney disease: Secondary | ICD-10-CM | POA: Diagnosis not present

## 2021-01-06 DIAGNOSIS — R410 Disorientation, unspecified: Secondary | ICD-10-CM

## 2021-01-06 DIAGNOSIS — R079 Chest pain, unspecified: Secondary | ICD-10-CM | POA: Diagnosis not present

## 2021-01-06 DIAGNOSIS — E1122 Type 2 diabetes mellitus with diabetic chronic kidney disease: Secondary | ICD-10-CM | POA: Diagnosis not present

## 2021-01-06 DIAGNOSIS — R0602 Shortness of breath: Secondary | ICD-10-CM | POA: Diagnosis not present

## 2021-01-06 DIAGNOSIS — D45 Polycythemia vera: Secondary | ICD-10-CM | POA: Diagnosis not present

## 2021-01-06 DIAGNOSIS — Z20822 Contact with and (suspected) exposure to covid-19: Secondary | ICD-10-CM | POA: Diagnosis not present

## 2021-01-06 DIAGNOSIS — E1159 Type 2 diabetes mellitus with other circulatory complications: Secondary | ICD-10-CM | POA: Diagnosis not present

## 2021-01-06 DIAGNOSIS — Z79899 Other long term (current) drug therapy: Secondary | ICD-10-CM | POA: Diagnosis not present

## 2021-01-06 DIAGNOSIS — N1831 Chronic kidney disease, stage 3a: Secondary | ICD-10-CM | POA: Diagnosis not present

## 2021-01-06 DIAGNOSIS — Z7982 Long term (current) use of aspirin: Secondary | ICD-10-CM | POA: Diagnosis not present

## 2021-01-06 DIAGNOSIS — Z8616 Personal history of COVID-19: Secondary | ICD-10-CM | POA: Diagnosis not present

## 2021-01-06 LAB — BASIC METABOLIC PANEL
Anion gap: 10 (ref 5–15)
BUN: 30 mg/dL — ABNORMAL HIGH (ref 8–23)
CO2: 21 mmol/L — ABNORMAL LOW (ref 22–32)
Calcium: 9.5 mg/dL (ref 8.9–10.3)
Chloride: 106 mmol/L (ref 98–111)
Creatinine, Ser: 1.52 mg/dL — ABNORMAL HIGH (ref 0.61–1.24)
GFR, Estimated: 50 mL/min — ABNORMAL LOW (ref >60)
Glucose, Bld: 124 mg/dL — ABNORMAL HIGH (ref 70–99)
Potassium: 4.7 mmol/L (ref 3.5–5.1)
Sodium: 137 mmol/L (ref 135–145)

## 2021-01-06 LAB — CBC
HCT: 55 % — ABNORMAL HIGH (ref 39.0–52.0)
Hemoglobin: 18.3 g/dL — ABNORMAL HIGH (ref 13.0–17.0)
MCH: 29.3 pg (ref 26.0–34.0)
MCHC: 33.3 g/dL (ref 30.0–36.0)
MCV: 88.1 fL (ref 80.0–100.0)
Platelets: 196 10*3/uL (ref 150–400)
RBC: 6.24 MIL/uL — ABNORMAL HIGH (ref 4.22–5.81)
RDW: 13.1 % (ref 11.5–15.5)
WBC: 7.1 10*3/uL (ref 4.0–10.5)
nRBC: 0 % (ref 0.0–0.2)

## 2021-01-06 LAB — GLUCOSE, CAPILLARY
Glucose-Capillary: 117 mg/dL — ABNORMAL HIGH (ref 70–99)
Glucose-Capillary: 131 mg/dL — ABNORMAL HIGH (ref 70–99)
Glucose-Capillary: 156 mg/dL — ABNORMAL HIGH (ref 70–99)
Glucose-Capillary: 156 mg/dL — ABNORMAL HIGH (ref 70–99)
Glucose-Capillary: 167 mg/dL — ABNORMAL HIGH (ref 70–99)

## 2021-01-06 LAB — TROPONIN I (HIGH SENSITIVITY)
Troponin I (High Sensitivity): 5 ng/L (ref <18)
Troponin I (High Sensitivity): 6 ng/L (ref <18)

## 2021-01-06 MED ORDER — LORAZEPAM 2 MG/ML IJ SOLN
1.0000 mg | INTRAMUSCULAR | Status: AC
  Filled 2021-01-06: qty 1

## 2021-01-06 MED ORDER — TRAZODONE HCL 50 MG PO TABS: 50.0000 mg | ORAL_TABLET | Freq: Every evening | ORAL | Status: DC | PRN

## 2021-01-06 MED ORDER — PANTOPRAZOLE SODIUM 40 MG PO TBEC
40.0000 mg | DELAYED_RELEASE_TABLET | Freq: Two times a day (BID) | ORAL | Status: DC
  Administered 2021-01-06 – 2021-01-08 (×3): 40 mg via ORAL
  Filled 2021-01-06 (×4): qty 1

## 2021-01-06 MED ORDER — PANTOPRAZOLE SODIUM 40 MG IV SOLR
40.0000 mg | Freq: Two times a day (BID) | INTRAVENOUS | Status: DC
  Filled 2021-01-06: qty 40

## 2021-01-06 MED ORDER — QUETIAPINE FUMARATE 25 MG PO TABS
25.0000 mg | ORAL_TABLET | Freq: Two times a day (BID) | ORAL | Status: DC | PRN
  Administered 2021-01-06 – 2021-01-07 (×2): 25 mg via ORAL
  Filled 2021-01-06 (×2): qty 1

## 2021-01-06 MED ORDER — PANTOPRAZOLE SODIUM 40 MG IV SOLR: 40.0000 mg | Freq: Two times a day (BID) | INTRAVENOUS | Status: DC

## 2021-01-06 NOTE — Progress Notes (Signed)
Pt more calm now and agreeable to stress test. Dr Tyrell Antonio and Dr Quentin Ore notified of loss of IV's. IV team still working to gain new IV access.

## 2021-01-06 NOTE — Progress Notes (Signed)
Attempts oob and became combative with phlebotomist and NT. Pt hit Left arm on Lab cart and opened up a couple scratch marks on his forearm. Arm wrapped with sterile guaze and 4 x 4's. IV team into room attempting new IV.

## 2021-01-06 NOTE — Progress Notes (Addendum)
    Follow up regarding test stressing. This was canceled this morning as staff was unable to maintain IV access despite several attempts. He has also been on CIWA protocol and received several doses of ativan along with requiring sitter supervision. This afternoon, per nursing staff he has been confused/forgetful has repetitive questioning. Security was called just prior to my arrival. He is seen in the doorway, fully dressed with sitter present. Per RN primary to place a psych consult. At this time he is not appropriate to undergo further cardiac work up, unable to appropriately consent. HsTn negative on admission. Would treat medically ASA and statin. Will defer further testing for now and defer ongoing management to primary team. Bedside RN updated as well.   SignedReino Bellis, NP-C 01/06/2021, 3:18 PM Pager: [phone]

## 2021-01-06 NOTE — Progress Notes (Signed)
Progress Note  Patient Name: MARKES SHATSWELL Date of Encounter: 01/06/2021  [facility] Cardiologist: None   Subjective   No acute events overnight.  Left heart catheterization canceled because patient was concerned about IV contrast and was unsure whether or not he could remember to take DAPT.    Inpatient Medications    Scheduled Meds:  alfuzosin  10 mg Oral Q breakfast   ARIPiprazole  2 mg Oral Daily   aspirin EC  81 mg Oral Daily   atorvastatin  80 mg Oral Daily   empagliflozin  10 mg Oral Daily   finasteride  5 mg Oral Daily   folic acid  1 mg Oral Daily   heparin injection (subcutaneous)  5,000 Units Subcutaneous Q8H   insulin aspart  0-9 Units Subcutaneous Q4H   metoprolol tartrate  50 mg Oral BID   multivitamin with minerals  1 tablet Oral Daily   pantoprazole (PROTONIX) IV  40 mg Intravenous Q12H   Semaglutide  1 tablet Oral Daily   sodium chloride flush  3 mL Intravenous Q12H   thiamine  100 mg Oral Daily   Or   thiamine  100 mg Intravenous Daily   Continuous Infusions:  sodium chloride     PRN Meds: sodium chloride, acetaminophen **OR** acetaminophen, LORazepam **OR** LORazepam, nitroGLYCERIN, sodium chloride flush   Vital Signs    Vitals:   01/06/21 0054 01/06/21 0400 01/06/21 0652 01/06/21 0720  BP: (!) 142/85  136/80 137/87  Pulse: (!) 55 (!) 56 (!) 52 (!) 56  Resp:    18  Temp:   97.7 F (36.5 C) 97.8 F (36.6 C)  TempSrc:   Oral Oral  SpO2: 99% 96% 99% 99%  Weight:      Height:        Intake/Output Summary (Last 24 hours) at 01/06/2021 0940 Last data filed at 01/06/2021 6578 Gross per 24 hour  Intake 1110.53 ml  Output 400 ml  Net 710.53 ml   Last 3 Weights 01/04/2021 12/12/2020 12/06/2020  Weight (lbs) 175 lb 178 lb 178 lb 9.6 oz  Weight (kg) 79.379 kg 80.74 kg 81.012 kg      Telemetry    Personally Reviewed  ECG    No new.  Personally Reviewed  Physical Exam   GEN: No acute distress. Neck: No JVD Cardiac: RRR, no  murmurs, rubs, or gallops.  Respiratory: Clear to auscultation bilaterally. GI: Soft, nontender, non-distended  MS: No edema; No deformity. Neuro:  Nonfocal  Psych: Normal affect   Labs    High Sensitivity Troponin:   Recent Labs  Lab 01/04/21 1650 01/04/21 1754 01/05/21 0245  TROPONINIHS 8 7 9      Chemistry Recent Labs  Lab 01/04/21 1650 01/05/21 1130 01/06/21 0758  NA 135 134* 137  K 4.6 4.8 4.7  CL 100 102 106  CO2 23 23 21*  GLUCOSE 217* 148* 124*  BUN 41* 36* 30*  CREATININE 1.77* 1.65* 1.52*  CALCIUM 9.7 8.9 9.5  GFRNONAA 42* 46* 50*  ANIONGAP 12 9 10     Lipids No results for input(s): CHOL, TRIG, HDL, LABVLDL, LDLCALC, CHOLHDL in the last 168 hours.  Hematology Recent Labs  Lab 01/04/21 1650 01/06/21 0758  WBC 10.0 7.1  RBC 6.23* 6.24*  HGB 18.0* 18.3*  HCT 53.8* 55.0*  MCV 86.4 88.1  MCH 28.9 29.3  MCHC 33.5 33.3  RDW 13.1 13.1  PLT 223 196   Thyroid No results for input(s): TSH, FREET4 in the last 168  hours.  BNP Recent Labs  Lab 01/04/21 1554  BNP 13.2    DDimer No results for input(s): DDIMER in the last 168 hours.   Radiology    DG Chest 2 View  Result Date: 01/04/2021 CLINICAL DATA:  Chest pain EXAM: CHEST - 2 VIEW COMPARISON:  Chest radiograph 11/11/2020 FINDINGS: The cardiomediastinal silhouette is stable. There is no focal consolidation or pulmonary edema. There is no pleural effusion or pneumothorax. Scarring in the lateral left base is unchanged. There is no acute osseous abnormality. IMPRESSION: No radiographic evidence of acute cardiopulmonary process. Electronically Signed   By: Valetta Mole M.D.   On: 01/04/2021 17:00   ECHOCARDIOGRAM COMPLETE  Result Date: 01/05/2021    ECHOCARDIOGRAM REPORT   Patient Name:   SAUD BAIL Date of Exam: 01/05/2021 Medical Rec #:  [id number]    Height:       66.0 in Accession #:    [id number]   Weight:       175.0 lb Date of Birth:  1956/03/05    BSA:          1.889 m Patient Age:    65 years      BP:           173/109 mmHg Patient Gender: M            HR:           60 bpm. Exam Location:  Inpatient Procedure: 2D Echo, Cardiac Doppler and Color Doppler Indications:    chest pain  History:        Patient has no prior history of Echocardiogram examinations.                 Risk Factors:Hypertension, Diabetes and Dyslipidemia.  Sonographer:    MB Referring Phys: [id number] [place]  1. Left ventricular ejection fraction, by estimation, is 60 to 65%. The left ventricle has normal function. The left ventricle has no regional wall motion abnormalities. Left ventricular diastolic parameters are consistent with Grade I diastolic dysfunction (impaired relaxation).  2. Right ventricular systolic function is normal. The right ventricular size is normal.  3. Left atrial size was mildly dilated.  4. The mitral valve is normal in structure. Trivial mitral valve regurgitation. No evidence of mitral stenosis.  5. The aortic valve is tricuspid. Aortic valve regurgitation is not visualized. Mild to moderate aortic valve sclerosis/calcification is present, without any evidence of aortic stenosis.  6. The inferior vena cava is normal in size with greater than 50% respiratory variability, suggesting right atrial pressure of 3 mmHg. FINDINGS  Left Ventricle: Left ventricular ejection fraction, by estimation, is 60 to 65%. The left ventricle has normal function. The left ventricle has no regional wall motion abnormalities. The left ventricular internal cavity size was normal in size. There is  no left ventricular hypertrophy. Left ventricular diastolic parameters are consistent with Grade I diastolic dysfunction (impaired relaxation). Right Ventricle: The right ventricular size is normal. Right ventricular systolic function is normal. Left Atrium: Left atrial size was mildly dilated. Right Atrium: Right atrial size was normal in size. Pericardium: There is no evidence of pericardial effusion. Mitral Valve: The  mitral valve is normal in structure. Trivial mitral valve regurgitation. No evidence of mitral valve stenosis. Tricuspid Valve: The tricuspid valve is normal in structure. Tricuspid valve regurgitation is trivial. No evidence of tricuspid stenosis. Aortic Valve: The aortic valve is tricuspid. Aortic valve regurgitation is not visualized. Mild to moderate aortic valve sclerosis/calcification is present,  without any evidence of aortic stenosis. Pulmonic Valve: The pulmonic valve was not well visualized. Pulmonic valve regurgitation is not visualized. No evidence of pulmonic stenosis. Aorta: The aortic root is normal in size and structure. Venous: The inferior vena cava is normal in size with greater than 50% respiratory variability, suggesting right atrial pressure of 3 mmHg. IAS/Shunts: No atrial level shunt detected by color flow Doppler.  LEFT VENTRICLE PLAX 2D LVIDd:         4.30 cm   Diastology LVIDs:         2.80 cm   LV e' medial:    6.31 cm/s LV PW:         0.90 cm   LV E/e' medial:  12.6 LV IVS:        1.00 cm   LV e' lateral:   7.51 cm/s LVOT diam:     1.80 cm   LV E/e' lateral: 10.6 LV SV:         67 LV SV Index:   36 LVOT Area:     2.54 cm  RIGHT VENTRICLE RV S prime:     12.80 cm/s TAPSE (M-mode): 1.6 cm LEFT ATRIUM             Index        RIGHT ATRIUM           Index LA Vol (A2C):   81.6 ml 43.19 ml/m  RA Area:     17.50 cm LA Vol (A4C):   59.7 ml 31.60 ml/m  RA Volume:   54.50 ml  28.84 ml/m LA Biplane Vol: 71.9 ml 38.05 ml/m  AORTIC VALVE LVOT Vmax:   126.00 cm/s LVOT Vmean:  79.600 cm/s LVOT VTI:    0.265 m  AORTA Ao Root diam: 3.10 cm MITRAL VALVE               TRICUSPID VALVE MV Area (PHT): 2.13 cm    TR Peak grad:   18.3 mmHg MV Decel Time: 356 msec    TR Vmax:        214.00 cm/s MV E velocity: 79.30 cm/s MV A velocity: 74.60 cm/s  SHUNTS MV E/A ratio:  1.06        Systemic VTI:  0.26 m                            Systemic Diam: 1.80 cm Kirk Ruths MD Electronically signed by Kirk Ruths MD Signature Date/Time: 01/05/2021/3:24:38 PM    Final    CT Angio Chest/Abd/Pel for Dissection W and/or Wo Contrast  Result Date: 01/04/2021 CLINICAL DATA:  Chest and back pain EXAM: CT ANGIOGRAPHY CHEST, ABDOMEN AND PELVIS TECHNIQUE: Non-contrast CT of the chest was initially obtained. Multidetector CT imaging through the chest, abdomen and pelvis was performed using the standard protocol during bolus administration of intravenous contrast. Multiplanar reconstructed images and MIPs were obtained and reviewed to evaluate the vascular anatomy. CONTRAST:  38mL OMNIPAQUE IOHEXOL 350 MG/ML SOLN COMPARISON:  01/06/2021, 10/25/2020 FINDINGS: CTA CHEST FINDINGS Cardiovascular: The heart is unremarkable without pericardial effusion. No evidence of thoracic aortic aneurysm or dissection. No evidence of pulmonary embolus. There is moderate atherosclerosis throughout the LAD distribution of the coronary vasculature. Mediastinum/Nodes: No enlarged mediastinal, hilar, or axillary lymph nodes. Thyroid gland, trachea, and esophagus demonstrate no significant findings. Lungs/Pleura: No acute airspace disease, effusion, or pneumothorax. Central airways are patent. Musculoskeletal: No acute or destructive bony lesions. Reconstructed  images demonstrate no additional findings. Review of the MIP images confirms the above findings. CTA ABDOMEN AND PELVIS FINDINGS VASCULAR Aorta: Normal caliber aorta without aneurysm, dissection, vasculitis or significant stenosis. Celiac: Patent without evidence of aneurysm, dissection, vasculitis or significant stenosis. SMA: Eccentric atheromatous plaque at the origin of the SMA is chronic, with approximate 50% stenosis. No evidence of acute dissection or vasculitis. No evidence of aneurysm. Renals: Both renal arteries are patent without evidence of aneurysm, dissection, vasculitis, fibromuscular dysplasia or significant stenosis. IMA: Patent without evidence of aneurysm, dissection,  vasculitis or significant stenosis. Mild narrowing at the origin unchanged. Inflow: Patent without evidence of aneurysm, dissection, vasculitis or significant stenosis. Mild atherosclerosis. Veins: No obvious venous abnormality within the limitations of this arterial phase study. Review of the MIP images confirms the above findings. NON-VASCULAR Hepatobiliary: Small calcified gallstones are identified without cholecystitis. No focal liver abnormality. Pancreas: Unremarkable. No pancreatic ductal dilatation or surrounding inflammatory changes. Spleen: Normal in size without focal abnormality. Adrenals/Urinary Tract: Postsurgical changes are seen from prior right ureteral reimplantation. There is persistent right-sided hydronephrosis and hydroureter, with severe right renal cortical atrophy unchanged. Stable left kidney, with multiple renal cysts again noted. No left-sided obstruction. Bladder is stable. The adrenals are unremarkable. Stomach/Bowel: No bowel obstruction or ileus. Normal appendix right lower quadrant. Scattered colonic diverticulosis without diverticulitis. Lymphatic: No pathologic adenopathy within the abdomen or pelvis. Reproductive: Stable enlargement of the prostate. Other: No free fluid or free gas. Stable fat containing umbilical hernias. Musculoskeletal: No acute or destructive bony lesions. Reconstructed images demonstrate no additional findings. Review of the MIP images confirms the above findings. IMPRESSION: 1. No evidence of thoracoabdominal aortic aneurysm or dissection. 2. No evidence of pulmonary embolus. 3. Moderate coronary artery atherosclerosis. 4. Cholelithiasis without cholecystitis. 5. Chronic right renal atrophy with stable right hydronephrosis and hydroureter related to prior right ureteral surgery for reimplantation. 6. Stable enlargement of the prostate. 7.  Aortic Atherosclerosis (ICD10-I70.0). Electronically Signed   By: Randa Ngo M.D.   On: 01/04/2021 21:46     Cardiac Studies   No new    Assessment & Plan    Mr. Gacek is a 65 year old man with CKD 3A, diabetes, hypertension, hyperlipidemia, alcohol abuse, tobacco abuse who was admitted with chest pain.  CTA performed to evaluate for pulmonary embolism showed moderate coronary artery atherosclerosis.  Stress test is planned.  #Chest pain Continue aspirin 81 mg by mouth once daily Continue atorvastatin 80 Continue metoprolol twice daily Stress test has been ordered  #CKD 3A Creatinine 1.5 today which is baseline  #Diabetes Sliding scale insulin  #Alcohol abuse CIWA protocol thiamine     Signed, Vickie Epley, MD  01/06/2021, 9:40 AM

## 2021-01-06 NOTE — Consult Note (Signed)
[facility] Consult   Reason for Consult:  "capacity, history of mental illness, family able to give details" Referring Physician:  Dr. Tyrell Antonio Patient Identification: [name] MRN:  [id number] Principal Diagnosis: Chest pain Diagnosis:  Principal Problem:   Chest pain Active Problems:   Benign hypertension   Chronic kidney disease (CKD), stage III (moderate) (HCC)   Type 2 diabetes mellitus ([place])   HLD (hyperlipidemia)   Total Time spent with patient: 30 minutes  Subjective:   [name] is a 65 y.o. male with a history of reported depression, bipolar patient admitted with chest pain. Patient has been recommended to have a stress test as part of his workup; however, patient is now requesting to leave AMA. Psychiatry was consulted for capacity to leave AMA as patient has been noted to be confused and disoriented by staff members.   Patient seen and chart reviewed. Most recent CIWA 25. Patient is AAO to person, month, year, and situation in interview. He is calm and cooperative. 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 Patient currently denies depression and manic symptoms (DIGFAST). He states that he would like to leave in order to go have dinner with his girlfriend. See below for capacity evaluation to leave AMA  On my interview, patient is in NAD, alert, oriented, calm, cooperative, and attentive, with normal affect, speech, and behavior. Objectively, there is no evidence of psychosis/ mania (able to converse coherently, linear and goal directed thought, no RIS, no distractibility, not pre-occupied, no FOI, etc) nor depression to the point of suicidality (able to concentrate, affect full and reactive, speech normal r/v/t, no psychomotor retardation/agitation, etc).   On chart review, patient has been assessed by psychiatry service before for depression but was found to not meet criteria for  IVC and was discharged.   Past Psychiatric History: Previous Medication Trials: abilify, unable to recall others Previous Psychiatric Hospitalizations: denies Previous Suicide Attempts: denies History of Violence: denies although has been agitated per chart at times  Outpatient psychiatrist: denies, states that psychiatric medications are prescribed by PCP  Social History: Marital Status: not married Children: 0 Source of Income: unemployed Housing Status: states that he has an apartment with "14 roommates" Easy access to gun: denies  Substance Use (with emphasis over the last 12 months) Recreational Drugs: denies Use of Alcohol: reported occasional etoh use; denies recent use although elevated CIWA scores. No etoh ordered on presentation Tobacco Use: denies Rehab History: denies H/O Complicated Withdrawal: denies    Capacity question: Does the patient possess the ability to make an informed decision, regarding the proposed treatment/option for his care?  Capacity for a given decision requires:  Understanding - the ability to state the meaning of the relevant information (eg, diagnosis, risks and benefits of a treatment or procedure, indications, and options of care).  Appreciation - the ability to explain how information (eg, diagnosis, benefit, and risk) applies to oneself.  Reasoning - the ability to compare information and infer consequences of choice.  Expressing a choice - the ability to state a decision.   Assessment of capacity:  patient demonstrates understanding and appreciation of benefits of test and continued workup; however, is unable to express clear understanding about the risk of leaving AMA-which in this situation (after discussing with primary team) would be the risk of leaving AMA without stress test to determine if a stent is needed. Patient repeatedly states that he believes  there is little risk and he will be "fine" as he would like to go have dinner  with his girlfriend . Does not demonstrate appreciation or  understanding that there could be larger risks such as MI and death if he does not undergo continued workup    Based upon my evaluation of [name] regarding his medical decision-making capacity for leaving AMA , I found with reasonable certainty that [name] does not have capacity to leave AMA as he is unable to demonstrate appreciation and understanding of risks.  Case discussed with: Dr. Tyrell Antonio     Risk to Self:  no Risk to Others:  no Prior Inpatient Therapy:  no Prior Outpatient Therapy:  yes  Past Medical History:  Past Medical History:  Diagnosis Date   Acute kidney injury superimposed on chronic kidney disease ([place]) 02/10/2020   Acute on chronic kidney failure ([place]) 09/28/2019   Closed nondisplaced spiral fracture of shaft of right tibia    Complicated urinary tract infection 09/06/2016   Depression    Diabetes mellitus without complication (HCC)    GERD (gastroesophageal reflux disease)    Gout    right foot   Hemorrhoids    Hypertension    Hypertension    Macular degeneration    Microcytic anemia    Prostate cancer ([place]) 08/2009   Rectal bleeding 07/31/2016   Tubular adenoma     Past Surgical History:  Procedure Laterality Date   CYSTOSCOPY WITH RETROGRADE PYELOGRAM, URETEROSCOPY AND STENT PLACEMENT Right 07/31/2016   Procedure: CYSTOSCOPY WITH RIGHT  RETROGRADE PYELOGRAM, URETEROSCOPY;  Surgeon: [name], MD;  Location: [facility];  Service: Urology;  Laterality: Right;   INGUINAL HERNIA REPAIR     Left ankle joint fusion  1981   PROSTATE BIOPSY     x 2   URETERAL REIMPLANTION  07/31/2016   Procedure: URETERAL REIMPLANT, right boari flap, right psoas hitch;  Surgeon: [name], MD;  Location: [facility];  Service: Urology;;   Family History:  Family History  Problem Relation Age of Onset   Lung cancer Mother        Deceased   Throat cancer Brother    Pancreatic cancer Father     Heart disease Father        Deceased   Lung cancer Maternal Uncle        nephew   Diabetes Other    Family Psychiatric  History: unknown Social History:  Social History   Substance and Sexual Activity  Alcohol Use Yes   Comment: ocassionally     Social History   Substance and Sexual Activity  Drug Use No    Social History   Socioeconomic History   Marital status: Divorced    Spouse name: Not on file   Number of children: 0   Years of education: Not on file   Highest education level: Not on file  Occupational History   Occupation: retired  Tobacco Use   Smoking status: Never   Smokeless tobacco: Never  Vaping Use   Vaping Use: Never used  Substance and Sexual Activity   Alcohol use: Yes    Comment: ocassionally   Drug use: No   Sexual activity: Yes  Other Topics Concern   Not on file  Social History Narrative   Not on file   Social Determinants of Health   Financial Resource Strain: Not on file  Food Insecurity: Not on file  Transportation Needs: Not on file  Physical  Activity: Not on file  Stress: Not on file  Social Connections: Not on file   Additional Social History:    Allergies:   Allergies  Allergen Reactions   Haldol [Haloperidol]     Per family cause life threatening complications to patient    Labs:  Results for orders placed or performed during the hospital encounter of 01/04/21 (from the past 48 hour(s))  Resp Panel by RT-PCR (Flu A&B, Covid) Nasopharyngeal Swab     Status: None   Collection Time: 01/05/21  1:49 AM   Specimen: Nasopharyngeal Swab; Nasopharyngeal(NP) swabs in vial transport medium  Result Value Ref Range   SARS Coronavirus 2 by RT PCR NEGATIVE NEGATIVE    Comment: (NOTE) SARS-CoV-2 target nucleic acids are NOT DETECTED.  The SARS-CoV-2 RNA is generally detectable in upper respiratory specimens during the acute phase of infection. The lowest concentration of SARS-CoV-2 viral copies this assay can detect is 138  copies/mL. A negative result does not preclude SARS-Cov-2 infection and should not be used as the sole basis for treatment or other patient management decisions. A negative result may occur with  improper specimen collection/handling, submission of specimen other than nasopharyngeal swab, presence of viral mutation(s) within the areas targeted by this assay, and inadequate number of viral copies(<138 copies/mL). A negative result must be combined with clinical observations, patient history, and epidemiological information. The expected result is Negative.  Fact Sheet for Patients:  EntrepreneurPulse.com.au  Fact Sheet for Healthcare Providers:  IncredibleEmployment.be  This test is no t yet approved or cleared by the [place] FDA and  has been authorized for detection and/or diagnosis of SARS-CoV-2 by FDA under an Emergency Use Authorization (EUA). This EUA will remain  in effect (meaning this test can be used) for the duration of the COVID-19 declaration under Section 564(b)(1) of the Act, 21 U.S.C.section 360bbb-3(b)(1), unless the authorization is terminated  or revoked sooner.       Influenza A by PCR NEGATIVE NEGATIVE   Influenza B by PCR NEGATIVE NEGATIVE    Comment: (NOTE) The Xpert Xpress SARS-CoV-2/FLU/RSV plus assay is intended as an aid in the diagnosis of influenza from Nasopharyngeal swab specimens and should not be used as a sole basis for treatment. Nasal washings and aspirates are unacceptable for Xpert Xpress SARS-CoV-2/FLU/RSV testing.  Fact Sheet for Patients: EntrepreneurPulse.com.au  Fact Sheet for Healthcare Providers: IncredibleEmployment.be  This test is not yet approved or cleared by the [place] FDA and has been authorized for detection and/or diagnosis of SARS-CoV-2 by FDA under an Emergency Use Authorization (EUA). This EUA will remain in effect (meaning this test  can be used) for the duration of the COVID-19 declaration under Section 564(b)(1) of the Act, 21 U.S.C. section 360bbb-3(b)(1), unless the authorization is terminated or revoked.  Performed at [facility], [place] [address] [place], [place] [address]   Troponin I (High Sensitivity)     Status: None   Collection Time: 01/05/21  2:45 AM  Result Value Ref Range   Troponin I (High Sensitivity) 9 <18 ng/L    Comment: (NOTE) Elevated high sensitivity troponin I (hsTnI) values and significant  changes across serial measurements may suggest ACS but many other  chronic and acute conditions are known to elevate hsTnI results.  Refer to the "Links" section for chest pain algorithms and additional  guidance. Performed at [facility], [place] [address], [place], [place] [address]   CBG monitoring, ED     Status: Abnormal  Collection Time: 01/05/21  4:06 AM  Result Value Ref Range   Glucose-Capillary 259 (H) 70 - 99 mg/dL    Comment: Glucose reference range applies only to samples taken after fasting for at least 8 hours.   Comment 1 Notify RN    Comment 2 Document in Chart   CBG monitoring, ED     Status: Abnormal   Collection Time: 01/05/21  8:19 AM  Result Value Ref Range   Glucose-Capillary 113 (H) 70 - 99 mg/dL    Comment: Glucose reference range applies only to samples taken after fasting for at least 8 hours.  CBG monitoring, ED     Status: Abnormal   Collection Time: 01/05/21  9:15 AM  Result Value Ref Range   Glucose-Capillary 159 (H) 70 - 99 mg/dL    Comment: Glucose reference range applies only to samples taken after fasting for at least 8 hours.  CBG monitoring, ED     Status: Abnormal   Collection Time: 01/05/21 11:19 AM  Result Value Ref Range   Glucose-Capillary 144 (H) 70 - 99 mg/dL    Comment: Glucose reference range applies only to samples taken after fasting for at least 8 hours.  Basic metabolic panel     Status: Abnormal   Collection Time: 01/05/21 11:30  AM  Result Value Ref Range   Sodium 134 (L) 135 - 145 mmol/L   Potassium 4.8 3.5 - 5.1 mmol/L   Chloride 102 98 - 111 mmol/L   CO2 23 22 - 32 mmol/L   Glucose, Bld 148 (H) 70 - 99 mg/dL    Comment: Glucose reference range applies only to samples taken after fasting for at least 8 hours.   BUN 36 (H) 8 - 23 mg/dL   Creatinine, Ser 1.65 (H) 0.61 - 1.24 mg/dL   Calcium 8.9 8.9 - 10.3 mg/dL   GFR, Estimated 46 (L) >60 mL/min    Comment: (NOTE) Calculated using the CKD-EPI Creatinine Equation (2021)    Anion gap 9 5 - 15    Comment: Performed at [facility] [address] [place], [place] [address]  Glucose, capillary     Status: Abnormal   Collection Time: 01/05/21  4:46 PM  Result Value Ref Range   Glucose-Capillary 133 (H) 70 - 99 mg/dL    Comment: Glucose reference range applies only to samples taken after fasting for at least 8 hours.  Glucose, capillary     Status: Abnormal   Collection Time: 01/05/21  8:04 PM  Result Value Ref Range   Glucose-Capillary 268 (H) 70 - 99 mg/dL    Comment: Glucose reference range applies only to samples taken after fasting for at least 8 hours.  Glucose, capillary     Status: Abnormal   Collection Time: 01/06/21 12:07 AM  Result Value Ref Range   Glucose-Capillary 156 (H) 70 - 99 mg/dL    Comment: Glucose reference range applies only to samples taken after fasting for at least 8 hours.  Glucose, capillary     Status: Abnormal   Collection Time: 01/06/21  7:22 AM  Result Value Ref Range   Glucose-Capillary 117 (H) 70 - 99 mg/dL    Comment: Glucose reference range applies only to samples taken after fasting for at least 8 hours.  CBC     Status: Abnormal   Collection Time: 01/06/21  7:58 AM  Result Value Ref Range   WBC 7.1 4.0 - 10.5 K/uL   RBC 6.24 (H) 4.22 - 5.81  MIL/uL   Hemoglobin 18.3 (H) 13.0 - 17.0 g/dL   HCT 55.0 (H) 39.0 - 52.0 %   MCV 88.1 80.0 - 100.0 fL   MCH 29.3 26.0 - 34.0 pg   MCHC 33.3 30.0 - 36.0 g/dL   RDW 13.1  11.5 - 15.5 %   Platelets 196 150 - 400 K/uL   nRBC 0.0 0.0 - 0.2 %    Comment: Performed at [facility] [address] [place], [place] [address]  Basic metabolic panel     Status: Abnormal   Collection Time: 01/06/21  7:58 AM  Result Value Ref Range   Sodium 137 135 - 145 mmol/L   Potassium 4.7 3.5 - 5.1 mmol/L   Chloride 106 98 - 111 mmol/L   CO2 21 (L) 22 - 32 mmol/L   Glucose, Bld 124 (H) 70 - 99 mg/dL    Comment: Glucose reference range applies only to samples taken after fasting for at least 8 hours.   BUN 30 (H) 8 - 23 mg/dL   Creatinine, Ser 1.52 (H) 0.61 - 1.24 mg/dL   Calcium 9.5 8.9 - 10.3 mg/dL   GFR, Estimated 50 (L) >60 mL/min    Comment: (NOTE) Calculated using the CKD-EPI Creatinine Equation (2021)    Anion gap 10 5 - 15    Comment: Performed at [facility] [address] [place], [place] [address]  Troponin I (High Sensitivity)     Status: None   Collection Time: 01/06/21  8:07 AM  Result Value Ref Range   Troponin I (High Sensitivity) 5 <18 ng/L    Comment: (NOTE) Elevated high sensitivity troponin I (hsTnI) values and significant  changes across serial measurements may suggest ACS but many other  chronic and acute conditions are known to elevate hsTnI results.  Refer to the "Links" section for chest pain algorithms and additional  guidance. Performed at [facility], [place] [address], [place], [place] [address]   Troponin I (High Sensitivity)     Status: None   Collection Time: 01/06/21 10:31 AM  Result Value Ref Range   Troponin I (High Sensitivity) 6 <18 ng/L    Comment: (NOTE) Elevated high sensitivity troponin I (hsTnI) values and significant  changes across serial measurements may suggest ACS but many other  chronic and acute conditions are known to elevate hsTnI results.  Refer to the "Links" section for chest pain algorithms and additional  guidance. Performed at [facility], [place] [address], [place],  [place] [address]   Glucose, capillary     Status: Abnormal   Collection Time: 01/06/21 11:49 AM  Result Value Ref Range   Glucose-Capillary 167 (H) 70 - 99 mg/dL    Comment: Glucose reference range applies only to samples taken after fasting for at least 8 hours.    Current Facility-Administered Medications  Medication Dose Route Frequency Provider Last Rate Last Admin   0.9 %  sodium chloride infusion  250 mL Intravenous PRN Sande Rives E, PA-C       acetaminophen (TYLENOL) tablet 650 mg  650 mg Oral Q6H PRN Shela Leff, MD   650 mg at 01/05/21 9924   Or   acetaminophen (TYLENOL) suppository 650 mg  650 mg Rectal Q6H PRN Shela Leff, MD       alfuzosin (UROXATRAL) 24 hr tablet 10 mg  10 mg Oral Q breakfast Donnamae Jude, MD       ARIPiprazole (ABILIFY) tablet 2 mg  2 mg Oral Daily Donnamae Jude, MD   2 mg at 01/06/21 1727   aspirin EC tablet 81 mg  81 mg Oral Daily Shela Leff, MD   81 mg at 01/06/21 1726   atorvastatin (LIPITOR) tablet 80 mg  80 mg Oral Daily Donnamae Jude, MD   80 mg at 01/06/21 1727   empagliflozin (JARDIANCE) tablet 10 mg  10 mg Oral Daily Donnamae Jude, MD       finasteride (PROSCAR) tablet 5 mg  5 mg Oral Daily Donnamae Jude, MD   5 mg at 38/88/28 0034   folic acid (FOLVITE) tablet 1 mg  1 mg Oral Daily Shela Leff, MD   1 mg at 01/06/21 1727   heparin injection 5,000 Units  5,000 Units Subcutaneous Q8H Shela Leff, MD   5,000 Units at 01/06/21 9179   insulin aspart (novoLOG) injection 0-9 Units  0-9 Units Subcutaneous Q4H Shela Leff, MD   2 Units at 01/06/21 0037   LORazepam (ATIVAN) injection 1 mg  1 mg Intramuscular STAT Regalado, Belkys A, MD       LORazepam (ATIVAN) tablet 1-4 mg  1-4 mg Oral Q1H PRN Shela Leff, MD   2 mg at 01/06/21 1404   Or   LORazepam (ATIVAN) injection 1-4 mg  1-4 mg Intravenous Q1H PRN Shela Leff, MD   2 mg at 01/06/21 0137   metoprolol tartrate (LOPRESSOR) tablet 50 mg  50 mg  Oral BID Donnamae Jude, MD   50 mg at 01/05/21 2033   multivitamin with minerals tablet 1 tablet  1 tablet Oral Daily Shela Leff, MD   1 tablet at 01/06/21 1726   nitroGLYCERIN (NITROGLYN) 2 % ointment 0.5 inch  0.5 inch Topical Q6H PRN Sande Rives E, PA-C       pantoprazole (PROTONIX) EC tablet 40 mg  40 mg Oral BID Regalado, Belkys A, MD       QUEtiapine (SEROQUEL) tablet 25 mg  25 mg Oral BID PRN Regalado, Belkys A, MD   25 mg at 01/06/21 1726   Semaglutide TABS 1 tablet  1 tablet Oral Daily Jerilynn Birkenhead, RPH       sodium chloride flush (NS) 0.9 % injection 3 mL  3 mL Intravenous Q12H Sande Rives E, PA-C   3 mL at 01/05/21 2106   sodium chloride flush (NS) 0.9 % injection 3 mL  3 mL Intravenous PRN Sande Rives E, PA-C       thiamine tablet 100 mg  100 mg Oral Daily Shela Leff, MD   100 mg at 01/06/21 1727   Or   thiamine (B-1) injection 100 mg  100 mg Intravenous Daily Shela Leff, MD       traZODone (DESYREL) tablet 50 mg  50 mg Oral QHS PRN Regalado, Belkys A, MD        Musculoskeletal: Strength & Muscle Tone: within normal limits Gait & Station: normal Patient leans: N/A            Psychiatric Specialty Exam:  Presentation  General Appearance: Appropriate for Environment  Eye Contact:Good  Speech:Clear and Coherent  Speech Volume:Normal  Handedness:Right   Mood and Affect  Mood:Euthymic  Affect:Congruent; Appropriate   Thought Process  Thought Processes:Coherent; Linear  Descriptions of Associations:Intact  Orientation:Full (Time, Place and Person)  Thought Content:WDL  History of Schizophrenia/Schizoaffective disorder:No  Duration of Psychotic Symptoms:No data recorded Hallucinations:Hallucinations: None Ideas of Reference:None  Suicidal Thoughts:Suicidal Thoughts: No Homicidal Thoughts:Homicidal Thoughts: No  Sensorium  Memory:Immediate Good; Recent Good; Remote  Fair  Judgment:Impaired  Insight:Lacking   Executive Functions  Concentration:Good  Attention Span:Good  Lyn[place]  Language:Good   Psychomotor Activity  Psychomotor Activity:Psychomotor Activity: Normal  Assets  Assets:Communication Skills; Desire for Improvement; Resilience; Housing   Sleep  Sleep:Sleep: Fair  Physical Exam: Physical Exam Constitutional:      Appearance: He is well-developed.  HENT:     Head: Normocephalic and atraumatic.  Neurological:     Mental Status: He is alert.   Review of Systems  Respiratory:  Negative for cough and shortness of breath.   Cardiovascular:  Negative for chest pain.  Psychiatric/Behavioral:  Negative for depression, hallucinations and suicidal ideas.   Blood pressure (!) 155/141, pulse (!) 43, temperature 99 F (37.2 C), temperature source Oral, resp. rate 18, height 5\' 6"  (1.676 m), weight 79.4 kg, SpO2 (!) 88 %. Body mass index is 28.25 kg/m.  Treatment Plan Summary: 65 yo male with a history of AUD and reported bipolar depression who presented to the ED for CP and was admitted for further work up. Patient requesting to leave AMA at this time. Patient was noted to be confused and disoriented by staff members and psychiatry was consulted for assistance. Most recent 73 of 72. On interview patient is AAO x 4 however is unable to demonstrate understanding and appreciation of risks of leaving AMA at this time- see above  Recommendations -continue abilify 2 mg -continue PRN seroquel -agree with  CIWA protocol  -currently lacks capacity to leave [facility]  -psychiatry will follow up tomorrow.   Disposition: No evidence of imminent risk to self or others at present.   Patient does not meet criteria for psychiatric inpatient admission.  Ival Bible, MD 01/06/2021 5:55 PM

## 2021-01-06 NOTE — Progress Notes (Addendum)
PROGRESS NOTE    [name]  BHA:[id number] DOB: 03-13-1956 DOA: 01/04/2021 PCP: [name], [name]   Brief Narrative: 65 year old past medical history significant for diabetes type 2, hypertension, hyperlipidemia, chronic CKD stage IIIa, prostate cancer, recurrent urinary obstruction history of alcohol abuse who presents complaining of chest pain.  He had negative troponin.  CTA negative for PE, moderate coronary artery atherosclerosis.  Plan is for stress test   Assessment & Plan:   Principal Problem:   Chest pain Active Problems:   Benign hypertension   Chronic kidney disease (CKD), stage III (moderate) (HCC)   Type 2 diabetes mellitus (HCC)   HLD (hyperlipidemia)  1-chest pain, unstable angina: Continue with Nitropaste as needed Plan for stress test today but patient was agitated unable to undergo procedure.  Plan for stress test tomorrow Continue with aspirin statins and metoprolol Start PPI Patient wanted to leave AMA, he is oriented to name, fail year . I think he has difficulty understanding importance of having evaluation. Family report he has mental illness, they don't want him to leave hospital. I will consult Psych. I have order seroquel PRN.   2-CKD stage IIIa: Continue to monitor Stable cr 1.5   3-Noninsulin-dependent Diabetes type II Hemoglobin A1c 8.4.  Continue with sliding scale Continue with Jardiance  Hypertension: Continue with metoprolol  Hyperlipidemia continue with statin Alcohol abuse: Continue with thiamine folate and Ativan as needed  Chronic polycythemia: Follow erythropoietin     Estimated body mass index is 28.25 kg/m as calculated from the following:   Height as of this encounter: 5\' 6"  (1.676 m).   Weight as of this encounter: 79.4 kg.   DVT prophylaxis: Heparin Code Status: Full code Family Communication: Disposition Plan:  Status is: Observation  The patient remains OBS appropriate and will d/c before 2  midnights.       Consultants:  Cardiology  Procedures:    Antimicrobials:    Subjective: He is alert oriented to time place and situation.  He has been agitated at times.  He has required Ativan today.  On and off he has been trying to sign out AMA.  Objective: Vitals:   01/06/21 0054 01/06/21 0400 01/06/21 0652 01/06/21 0720  BP: (!) 142/85  136/80 137/87  Pulse: (!) 55 (!) 56 (!) 52 (!) 56  Resp:    18  Temp:   97.7 F (36.5 C) 97.8 F (36.6 C)  TempSrc:   Oral Oral  SpO2: 99% 96% 99% 99%  Weight:      Height:        Intake/Output Summary (Last 24 hours) at 01/06/2021 0807 Last data filed at 01/06/2021 9735 Gross per 24 hour  Intake 1110.53 ml  Output 400 ml  Net 710.53 ml   Filed Weights   01/04/21 1551  Weight: 79.4 kg    Examination:  General exam: Appears calm and comfortable  Respiratory system: Clear to auscultation. Respiratory effort normal. Cardiovascular system: S1 & S2 heard, RRR. No JVD, murmurs, rubs, gallops or clicks. No pedal edema. Gastrointestinal system: Abdomen is nondistended, soft and nontender. No organomegaly or masses felt. Normal bowel sounds heard. Central nervous system: Alert and oriented.  Extremities: Symmetric 5 x 5 power.   Data Reviewed: I have personally reviewed following labs and imaging studies  CBC: Recent Labs  Lab 01/04/21 1650  WBC 10.0  NEUTROABS 8.3*  HGB 18.0*  HCT 53.8*  MCV 86.4  PLT 329   Basic Metabolic Panel: Recent Labs  Lab 01/04/21 1650  01/05/21 1130  NA 135 134*  K 4.6 4.8  CL 100 102  CO2 23 23  GLUCOSE 217* 148*  BUN 41* 36*  CREATININE 1.77* 1.65*  CALCIUM 9.7 8.9   GFR: Estimated Creatinine Clearance: 43.6 mL/min (A) (by C-G formula based on SCr of 1.65 mg/dL (H)). Liver Function Tests: No results for input(s): AST, ALT, ALKPHOS, BILITOT, PROT, ALBUMIN in the last 168 hours. No results for input(s): LIPASE, AMYLASE in the last 168 hours. No results for input(s): AMMONIA  in the last 168 hours. Coagulation Profile: No results for input(s): INR, PROTIME in the last 168 hours. Cardiac Enzymes: No results for input(s): CKTOTAL, CKMB, CKMBINDEX, TROPONINI in the last 168 hours. BNP (last 3 results) No results for input(s): PROBNP in the last 8760 hours. HbA1C: No results for input(s): HGBA1C in the last 72 hours. CBG: Recent Labs  Lab 01/05/21 1119 01/05/21 1646 01/05/21 2004 01/06/21 0007 01/06/21 0722  GLUCAP 144* 133* 268* 156* 117*   Lipid Profile: No results for input(s): CHOL, HDL, LDLCALC, TRIG, CHOLHDL, LDLDIRECT in the last 72 hours. Thyroid Function Tests: No results for input(s): TSH, T4TOTAL, FREET4, T3FREE, THYROIDAB in the last 72 hours. Anemia Panel: No results for input(s): VITAMINB12, FOLATE, FERRITIN, TIBC, IRON, RETICCTPCT in the last 72 hours. Sepsis Labs: No results for input(s): PROCALCITON, LATICACIDVEN in the last 168 hours.  Recent Results (from the past 240 hour(s))  Resp Panel by RT-PCR (Flu A&B, Covid) Nasopharyngeal Swab     Status: None   Collection Time: 01/05/21  1:49 AM   Specimen: Nasopharyngeal Swab; Nasopharyngeal(NP) swabs in vial transport medium  Result Value Ref Range Status   SARS Coronavirus 2 by RT PCR NEGATIVE NEGATIVE Final    Comment: (NOTE) SARS-CoV-2 target nucleic acids are NOT DETECTED.  The SARS-CoV-2 RNA is generally detectable in upper respiratory specimens during the acute phase of infection. The lowest concentration of SARS-CoV-2 viral copies this assay can detect is 138 copies/mL. A negative result does not preclude SARS-Cov-2 infection and should not be used as the sole basis for treatment or other patient management decisions. A negative result may occur with  improper specimen collection/handling, submission of specimen other than nasopharyngeal swab, presence of viral mutation(s) within the areas targeted by this assay, and inadequate number of viral copies(<138 copies/mL). A  negative result must be combined with clinical observations, patient history, and epidemiological information. The expected result is Negative.  Fact Sheet for Patients:  EntrepreneurPulse.com.au  Fact Sheet for Healthcare Providers:  IncredibleEmployment.be  This test is no t yet approved or cleared by the [place] FDA and  has been authorized for detection and/or diagnosis of SARS-CoV-2 by FDA under an Emergency Use Authorization (EUA). This EUA will remain  in effect (meaning this test can be used) for the duration of the COVID-19 declaration under Section 564(b)(1) of the Act, 21 U.S.C.section 360bbb-3(b)(1), unless the authorization is terminated  or revoked sooner.       Influenza A by PCR NEGATIVE NEGATIVE Final   Influenza B by PCR NEGATIVE NEGATIVE Final    Comment: (NOTE) The Xpert Xpress SARS-CoV-2/FLU/RSV plus assay is intended as an aid in the diagnosis of influenza from Nasopharyngeal swab specimens and should not be used as a sole basis for treatment. Nasal washings and aspirates are unacceptable for Xpert Xpress SARS-CoV-2/FLU/RSV testing.  Fact Sheet for Patients: EntrepreneurPulse.com.au  Fact Sheet for Healthcare Providers: IncredibleEmployment.be  This test is not yet approved or cleared by the [place] and  has been authorized for detection and/or diagnosis of SARS-CoV-2 by FDA under an Emergency Use Authorization (EUA). This EUA will remain in effect (meaning this test can be used) for the duration of the COVID-19 declaration under Section 564(b)(1) of the Act, 21 U.S.C. section 360bbb-3(b)(1), unless the authorization is terminated or revoked.  Performed at [facility], [place] [address], [place], [place] [address]          Radiology Studies: DG Chest 2 View  Result Date: 01/04/2021 CLINICAL DATA:  Chest pain EXAM: CHEST - 2 VIEW COMPARISON:  Chest  radiograph 11/11/2020 FINDINGS: The cardiomediastinal silhouette is stable. There is no focal consolidation or pulmonary edema. There is no pleural effusion or pneumothorax. Scarring in the lateral left base is unchanged. There is no acute osseous abnormality. IMPRESSION: No radiographic evidence of acute cardiopulmonary process. Electronically Signed   By: Valetta Mole M.D.   On: 01/04/2021 17:00   ECHOCARDIOGRAM COMPLETE  Result Date: 01/05/2021    ECHOCARDIOGRAM REPORT   Patient Name:   DEMARIAN EPPS Date of Exam: 01/05/2021 Medical Rec #:  [id number]    Height:       66.0 in Accession #:    [id number]   Weight:       175.0 lb Date of Birth:  09/18/1955    BSA:          1.889 m Patient Age:    60 years     BP:           173/109 mmHg Patient Gender: M            HR:           60 bpm. Exam Location:  Inpatient Procedure: 2D Echo, Cardiac Doppler and Color Doppler Indications:    chest pain  History:        Patient has no prior history of Echocardiogram examinations.                 Risk Factors:Hypertension, Diabetes and Dyslipidemia.  Sonographer:    MB Referring Phys: [id number] [place]  1. Left ventricular ejection fraction, by estimation, is 60 to 65%. The left ventricle has normal function. The left ventricle has no regional wall motion abnormalities. Left ventricular diastolic parameters are consistent with Grade I diastolic dysfunction (impaired relaxation).  2. Right ventricular systolic function is normal. The right ventricular size is normal.  3. Left atrial size was mildly dilated.  4. The mitral valve is normal in structure. Trivial mitral valve regurgitation. No evidence of mitral stenosis.  5. The aortic valve is tricuspid. Aortic valve regurgitation is not visualized. Mild to moderate aortic valve sclerosis/calcification is present, without any evidence of aortic stenosis.  6. The inferior vena cava is normal in size with greater than 50% respiratory variability, suggesting  right atrial pressure of 3 mmHg. FINDINGS  Left Ventricle: Left ventricular ejection fraction, by estimation, is 60 to 65%. The left ventricle has normal function. The left ventricle has no regional wall motion abnormalities. The left ventricular internal cavity size was normal in size. There is  no left ventricular hypertrophy. Left ventricular diastolic parameters are consistent with Grade I diastolic dysfunction (impaired relaxation). Right Ventricle: The right ventricular size is normal. Right ventricular systolic function is normal. Left Atrium: Left atrial size was mildly dilated. Right Atrium: Right atrial size was normal in size. Pericardium: There is no evidence of pericardial effusion. Mitral Valve: The mitral valve is normal in structure. Trivial mitral valve regurgitation.  No evidence of mitral valve stenosis. Tricuspid Valve: The tricuspid valve is normal in structure. Tricuspid valve regurgitation is trivial. No evidence of tricuspid stenosis. Aortic Valve: The aortic valve is tricuspid. Aortic valve regurgitation is not visualized. Mild to moderate aortic valve sclerosis/calcification is present, without any evidence of aortic stenosis. Pulmonic Valve: The pulmonic valve was not well visualized. Pulmonic valve regurgitation is not visualized. No evidence of pulmonic stenosis. Aorta: The aortic root is normal in size and structure. Venous: The inferior vena cava is normal in size with greater than 50% respiratory variability, suggesting right atrial pressure of 3 mmHg. IAS/Shunts: No atrial level shunt detected by color flow Doppler.  LEFT VENTRICLE PLAX 2D LVIDd:         4.30 cm   Diastology LVIDs:         2.80 cm   LV e' medial:    6.31 cm/s LV PW:         0.90 cm   LV E/e' medial:  12.6 LV IVS:        1.00 cm   LV e' lateral:   7.51 cm/s LVOT diam:     1.80 cm   LV E/e' lateral: 10.6 LV SV:         67 LV SV Index:   36 LVOT Area:     2.54 cm  RIGHT VENTRICLE RV S prime:     12.80 cm/s TAPSE  (M-mode): 1.6 cm LEFT ATRIUM             Index        RIGHT ATRIUM           Index LA Vol (A2C):   81.6 ml 43.19 ml/m  RA Area:     17.50 cm LA Vol (A4C):   59.7 ml 31.60 ml/m  RA Volume:   54.50 ml  28.84 ml/m LA Biplane Vol: 71.9 ml 38.05 ml/m  AORTIC VALVE LVOT Vmax:   126.00 cm/s LVOT Vmean:  79.600 cm/s LVOT VTI:    0.265 m  AORTA Ao Root diam: 3.10 cm MITRAL VALVE               TRICUSPID VALVE MV Area (PHT): 2.13 cm    TR Peak grad:   18.3 mmHg MV Decel Time: 356 msec    TR Vmax:        214.00 cm/s MV E velocity: 79.30 cm/s MV A velocity: 74.60 cm/s  SHUNTS MV E/A ratio:  1.06        Systemic VTI:  0.26 m                            Systemic Diam: 1.80 cm Kirk Ruths MD Electronically signed by Kirk Ruths MD Signature Date/Time: 01/05/2021/3:24:38 PM    Final    CT Angio Chest/Abd/Pel for Dissection W and/or Wo Contrast  Result Date: 01/04/2021 CLINICAL DATA:  Chest and back pain EXAM: CT ANGIOGRAPHY CHEST, ABDOMEN AND PELVIS TECHNIQUE: Non-contrast CT of the chest was initially obtained. Multidetector CT imaging through the chest, abdomen and pelvis was performed using the standard protocol during bolus administration of intravenous contrast. Multiplanar reconstructed images and MIPs were obtained and reviewed to evaluate the vascular anatomy. CONTRAST:  71mL OMNIPAQUE IOHEXOL 350 MG/ML SOLN COMPARISON:  01/06/2021, 10/25/2020 FINDINGS: CTA CHEST FINDINGS Cardiovascular: The heart is unremarkable without pericardial effusion. No evidence of thoracic aortic aneurysm or dissection. No evidence of pulmonary embolus. There is moderate atherosclerosis  throughout the LAD distribution of the coronary vasculature. Mediastinum/Nodes: No enlarged mediastinal, hilar, or axillary lymph nodes. Thyroid gland, trachea, and esophagus demonstrate no significant findings. Lungs/Pleura: No acute airspace disease, effusion, or pneumothorax. Central airways are patent. Musculoskeletal: No acute or destructive  bony lesions. Reconstructed images demonstrate no additional findings. Review of the MIP images confirms the above findings. CTA ABDOMEN AND PELVIS FINDINGS VASCULAR Aorta: Normal caliber aorta without aneurysm, dissection, vasculitis or significant stenosis. Celiac: Patent without evidence of aneurysm, dissection, vasculitis or significant stenosis. SMA: Eccentric atheromatous plaque at the origin of the SMA is chronic, with approximate 50% stenosis. No evidence of acute dissection or vasculitis. No evidence of aneurysm. Renals: Both renal arteries are patent without evidence of aneurysm, dissection, vasculitis, fibromuscular dysplasia or significant stenosis. IMA: Patent without evidence of aneurysm, dissection, vasculitis or significant stenosis. Mild narrowing at the origin unchanged. Inflow: Patent without evidence of aneurysm, dissection, vasculitis or significant stenosis. Mild atherosclerosis. Veins: No obvious venous abnormality within the limitations of this arterial phase study. Review of the MIP images confirms the above findings. NON-VASCULAR Hepatobiliary: Small calcified gallstones are identified without cholecystitis. No focal liver abnormality. Pancreas: Unremarkable. No pancreatic ductal dilatation or surrounding inflammatory changes. Spleen: Normal in size without focal abnormality. Adrenals/Urinary Tract: Postsurgical changes are seen from prior right ureteral reimplantation. There is persistent right-sided hydronephrosis and hydroureter, with severe right renal cortical atrophy unchanged. Stable left kidney, with multiple renal cysts again noted. No left-sided obstruction. Bladder is stable. The adrenals are unremarkable. Stomach/Bowel: No bowel obstruction or ileus. Normal appendix right lower quadrant. Scattered colonic diverticulosis without diverticulitis. Lymphatic: No pathologic adenopathy within the abdomen or pelvis. Reproductive: Stable enlargement of the prostate. Other: No free fluid  or free gas. Stable fat containing umbilical hernias. Musculoskeletal: No acute or destructive bony lesions. Reconstructed images demonstrate no additional findings. Review of the MIP images confirms the above findings. IMPRESSION: 1. No evidence of thoracoabdominal aortic aneurysm or dissection. 2. No evidence of pulmonary embolus. 3. Moderate coronary artery atherosclerosis. 4. Cholelithiasis without cholecystitis. 5. Chronic right renal atrophy with stable right hydronephrosis and hydroureter related to prior right ureteral surgery for reimplantation. 6. Stable enlargement of the prostate. 7.  Aortic Atherosclerosis (ICD10-I70.0). Electronically Signed   By: Randa Ngo M.D.   On: 01/04/2021 21:46        Scheduled Meds:  alfuzosin  10 mg Oral Q breakfast   ARIPiprazole  2 mg Oral Daily   aspirin EC  81 mg Oral Daily   atorvastatin  80 mg Oral Daily   empagliflozin  10 mg Oral Daily   finasteride  5 mg Oral Daily   folic acid  1 mg Oral Daily   heparin injection (subcutaneous)  5,000 Units Subcutaneous Q8H   insulin aspart  0-9 Units Subcutaneous Q4H   metoprolol tartrate  50 mg Oral BID   multivitamin with minerals  1 tablet Oral Daily   Semaglutide  1 tablet Oral Daily   sodium chloride flush  3 mL Intravenous Q12H   thiamine  100 mg Oral Daily   Or   thiamine  100 mg Intravenous Daily   Continuous Infusions:  sodium chloride       LOS: 0 days    Time spent: 35 minutes    Naz Denunzio A Kyston Gonce, MD [facility]   If 7PM-7AM, please contact night-coverage [email or web address]  01/06/2021, 8:07 AM

## 2021-01-06 NOTE — Progress Notes (Signed)
Pt needing IV access for Stress test;  has been seen/attempted by 2 IV Team RNs, for a total of 4 attempts; unable to thread catheters into veins, even with use of ultrasound;  sitter at bedside during attempts to obtain iv access;  pt remains with no iv .  RN aware

## 2021-01-07 ENCOUNTER — Observation Stay (HOSPITAL_COMMUNITY): Payer: Medicare Other

## 2021-01-07 DIAGNOSIS — R410 Disorientation, unspecified: Secondary | ICD-10-CM | POA: Diagnosis not present

## 2021-01-07 DIAGNOSIS — R42 Dizziness and giddiness: Secondary | ICD-10-CM | POA: Diagnosis not present

## 2021-01-07 DIAGNOSIS — G319 Degenerative disease of nervous system, unspecified: Secondary | ICD-10-CM | POA: Diagnosis not present

## 2021-01-07 DIAGNOSIS — R079 Chest pain, unspecified: Secondary | ICD-10-CM | POA: Diagnosis not present

## 2021-01-07 DIAGNOSIS — E1159 Type 2 diabetes mellitus with other circulatory complications: Secondary | ICD-10-CM | POA: Diagnosis not present

## 2021-01-07 LAB — NM MYOCAR MULTI W/SPECT W/WALL MOTION / EF
Base ST Depression (mm): 0 mm
Exercise duration (min): 5 min
MPHR: 154 {beats}/min
Peak HR: 93 {beats}/min
Percent HR: 60 %
Rest HR: 58 {beats}/min
ST Depression (mm): 0 mm

## 2021-01-07 LAB — GLUCOSE, CAPILLARY
Glucose-Capillary: 138 mg/dL — ABNORMAL HIGH (ref 70–99)
Glucose-Capillary: 199 mg/dL — ABNORMAL HIGH (ref 70–99)
Glucose-Capillary: 238 mg/dL — ABNORMAL HIGH (ref 70–99)
Glucose-Capillary: 179 mg/dL — ABNORMAL HIGH (ref 70–99)

## 2021-01-07 LAB — VITAMIN B12: Vitamin B-12: 823 pg/mL (ref 180–914)

## 2021-01-07 LAB — ERYTHROPOIETIN: Erythropoietin: 7.6 m[IU]/mL (ref 2.6–18.5)

## 2021-01-07 MED ORDER — TECHNETIUM TC 99M TETROFOSMIN IV KIT
10.1000 | PACK | Freq: Once | INTRAVENOUS | Status: AC | PRN
  Administered 2021-01-07: 10.1 via INTRAVENOUS

## 2021-01-07 MED ORDER — AMLODIPINE BESYLATE 5 MG PO TABS: 5.0000 mg | ORAL_TABLET | Freq: Every day | ORAL | 3 refills | Status: DC

## 2021-01-07 MED ORDER — PANTOPRAZOLE SODIUM 40 MG PO TBEC: 40.0000 mg | DELAYED_RELEASE_TABLET | Freq: Two times a day (BID) | ORAL | 0 refills | Status: DC

## 2021-01-07 MED ORDER — REGADENOSON 0.4 MG/5ML IV SOLN
0.4000 mg | Freq: Once | INTRAVENOUS | Status: DC
  Filled 2021-01-07: qty 5

## 2021-01-07 MED ORDER — AMLODIPINE BESYLATE 5 MG PO TABS
5.0000 mg | ORAL_TABLET | Freq: Every day | ORAL | Status: DC
  Administered 2021-01-08: 5 mg via ORAL
  Filled 2021-01-07: qty 1

## 2021-01-07 MED ORDER — REGADENOSON 0.4 MG/5ML IV SOLN
INTRAVENOUS | Status: AC
  Administered 2021-01-07: 0.4 mg
  Filled 2021-01-07: qty 5

## 2021-01-07 MED ORDER — TECHNETIUM TC 99M TETROFOSMIN IV KIT
30.2000 | PACK | Freq: Once | INTRAVENOUS | Status: AC | PRN
  Administered 2021-01-07: 30.2 via INTRAVENOUS

## 2021-01-07 NOTE — Progress Notes (Signed)
PN;  Came to see patient, patient develops dizziness, sat on the floor bathroom, he report left facial numbness. The numbness is on and off for few days now. He report some chronic left leg weakness, and vision problems.  He is able to  move all 4 extremities, upper extremities 5/5, LE chronic Left LE weakness stable 4/5.  Plan to check CBG, B 12, MRI brain. Orthostatic Vitals.  Cancelled discharge.  [name].

## 2021-01-07 NOTE — Discharge Summary (Signed)
Physician Discharge Summary  [name]:[id number] DOB: 02-Nov-1955 DOA: 01/04/2021  PCP: Randy Brine, [name]  Admit date: 01/04/2021 Discharge date: 01/07/2021  Admitted From: Home  Disposition:  Home   Recommendations for Outpatient Follow-up:  Follow up with PCP in 1-2 weeks Please obtain BMP/CBC in one week BP medication management.    Discharge Condition: Stable.  CODE STATUS: FUll code Diet recommendation: Heart Healthy / Carb Modified   Brief/Interim Summary: 65 year old past medical history significant for diabetes type 2, hypertension, hyperlipidemia, chronic CKD stage IIIa, prostate cancer, recurrent urinary obstruction history of alcohol abuse who presents complaining of chest pain.  He had negative troponin.  CTA negative for PE, moderate coronary artery atherosclerosis.  Plan is for stress test     1-Chest pain;  Received Nitropaste as needed Plan for stress test today but patient was agitated unable to undergo procedure.  Plan for stress test tomorrow Continue with aspirin statins and metoprolol Discharge on PPI Patient agree to stay overnight on 10/22. Underwent stress test, which was low risk. Ok to discharge home per cardiology.     2-CKD stage IIIa: Continue to monitor Stable cr 1.5 hold benicar at discharge.    3-Noninsulin-dependent Diabetes type II Hemoglobin A1c 8.4.  Continue with sliding scale Continue with Jardiance   Hypertension: Continue with metoprolol, added norvasc. Discontinue benicar due to CKD   Hyperlipidemia continue with statin Alcohol abuse: per family patient doesn't drink that much alcohol.    Chronic polycythemia: Follow erythropoietin        Discharge Diagnoses:  Principal Problem:   Chest pain Active Problems:   Benign hypertension   Chronic kidney disease (CKD), stage III (moderate) (HCC)   Type 2 diabetes mellitus (HCC)   HLD (hyperlipidemia)    Discharge Instructions  Discharge Instructions     Diet -  low sodium heart healthy   Complete by: As directed    Increase activity slowly   Complete by: As directed       Allergies as of 01/07/2021       Reactions   Haldol [haloperidol]    Per family cause life threatening complications to patient        Medication List     STOP taking these medications    olmesartan-hydrochlorothiazide 40-25 MG tablet Commonly known as: BENICAR HCT   sildenafil 20 MG tablet Commonly known as: REVATIO   traMADol 50 MG tablet Commonly known as: ULTRAM       TAKE these medications    acetaminophen 325 MG tablet Commonly known as: TYLENOL Take 2 tablets (650 mg total) by mouth every 6 (six) hours as needed for mild pain (or Fever >/= 101).   alfuzosin 10 MG 24 hr tablet Commonly known as: UROXATRAL Take 1 tablet (10 mg total) by mouth daily with breakfast.   amLODipine 5 MG tablet Commonly known as: NORVASC Take 1 tablet (5 mg total) by mouth daily.   ARIPiprazole 2 MG tablet Commonly known as: ABILIFY TAKE 1 TABLET BY MOUTH EVERY DAY   aspirin EC 81 MG tablet Take 81 mg by mouth daily with breakfast.   atorvastatin 40 MG tablet Commonly known as: LIPITOR 1 tablet by mouth Monday - Friday What changed:  how much to take how to take this when to take this additional instructions   clotrimazole-betamethasone cream Commonly known as: LOTRISONE Apply 1 application topically 2 (two) times daily.   diclofenac Sodium 1 % Gel Commonly known as: Voltaren Apply 4 g topically 4 (four)  times daily.   empagliflozin 10 MG Tabs tablet Commonly known as: JARDIANCE Take 10 mg by mouth daily.   finasteride 5 MG tablet Commonly known as: PROSCAR Take 1 tablet (5 mg total) by mouth daily.   metoprolol tartrate 50 MG tablet Commonly known as: LOPRESSOR Take 1 tablet (50 mg total) by mouth 2 (two) times daily.   multivitamin with minerals Tabs tablet Take 1 tablet by mouth daily.   nystatin powder Commonly known as:  nystatin Apply 1 application topically 3 (three) times daily.   pantoprazole 40 MG tablet Commonly known as: PROTONIX Take 1 tablet (40 mg total) by mouth 2 (two) times daily.   Rybelsus 14 MG Tabs Generic drug: Semaglutide Take 1 tablet by mouth daily. Take 30 minutes before breakfast   traZODone 50 MG tablet Commonly known as: DESYREL TAKE 1 TABLET (50 MG TOTAL) BY MOUTH AT BEDTIME AS NEEDED FOR SLEEP.   VISINE OP Place 1 drop into both eyes daily as needed (itching/irritation).        Allergies  Allergen Reactions   Haldol [Haloperidol]     Per family cause life threatening complications to patient    Consultations: Cardiology    Procedures/Studies: DG Chest 2 View  Result Date: 01/04/2021 CLINICAL DATA:  Chest pain EXAM: CHEST - 2 VIEW COMPARISON:  Chest radiograph 11/11/2020 FINDINGS: The cardiomediastinal silhouette is stable. There is no focal consolidation or pulmonary edema. There is no pleural effusion or pneumothorax. Scarring in the lateral left base is unchanged. There is no acute osseous abnormality. IMPRESSION: No radiographic evidence of acute cardiopulmonary process. Electronically Signed   By: Valetta Mole M.D.   On: 01/04/2021 17:00   NM Myocar Multi W/Spect W/Wall Motion / EF  Result Date: 01/07/2021 CLINICAL DATA:  65 year old male with history of chest pain. EXAM: MYOCARDIAL IMAGING WITH SPECT (REST AND PHARMACOLOGIC-STRESS) GATED LEFT VENTRICULAR WALL MOTION STUDY LEFT VENTRICULAR EJECTION FRACTION TECHNIQUE: Standard myocardial SPECT imaging was performed after resting intravenous injection of 10.1 mCi Tc-18m sestamibi. Subsequently, intravenous infusion of Lexiscan was performed under the supervision of the Cardiology staff. At peak effect of the drug, 30.2 mCi Tc-69m sestamibi was injected intravenously and standard myocardial SPECT imaging was performed. Quantitative gated imaging was also performed to evaluate left ventricular wall motion, and  estimate left ventricular ejection fraction. COMPARISON:  None. FINDINGS: Perfusion: No decreased activity in the left ventricle on stress imaging to suggest reversible ischemia or infarction. Wall Motion: Normal left ventricular wall motion. No left ventricular dilation. Left Ventricular Ejection Fraction: 58 % End diastolic volume 71 ml End systolic volume 30 ml IMPRESSION: 1. No reversible ischemia or infarction. 2. Normal left ventricular wall motion. 3. Left ventricular ejection fraction 58% 4. Non invasive risk stratification*: Low *2012 Appropriate Use Criteria for Coronary Revascularization Focused Update: J Am Coll Cardiol. 5784;69(6):295-284. http://content.airportbarriers.com.aspx?articleid=[email or web address] Electronically Signed   By: Ruthann Cancer M.D.   On: 01/07/2021 14:52   ECHOCARDIOGRAM COMPLETE  Result Date: 01/05/2021    ECHOCARDIOGRAM REPORT   Patient Name:   [name] Date of Exam: 01/05/2021 Medical Rec #:  [id number]    Height:       66.0 in Accession #:    [id number]   Weight:       175.0 lb Date of Birth:  04/18/1955    BSA:          1.889 m Patient Age:    45 years     BP:  173/109 mmHg Patient Gender: M            HR:           60 bpm. Exam Location:  Inpatient Procedure: 2D Echo, Cardiac Doppler and Color Doppler Indications:    chest pain  History:        Patient has no prior history of Echocardiogram examinations.                 Risk Factors:Hypertension, Diabetes and Dyslipidemia.  Sonographer:    MB Referring Phys: [id number] [place]  1. Left ventricular ejection fraction, by estimation, is 60 to 65%. The left ventricle has normal function. The left ventricle has no regional wall motion abnormalities. Left ventricular diastolic parameters are consistent with Grade I diastolic dysfunction (impaired relaxation).  2. Right ventricular systolic function is normal. The right ventricular size is normal.  3. Left atrial size was mildly dilated.  4. The mitral  valve is normal in structure. Trivial mitral valve regurgitation. No evidence of mitral stenosis.  5. The aortic valve is tricuspid. Aortic valve regurgitation is not visualized. Mild to moderate aortic valve sclerosis/calcification is present, without any evidence of aortic stenosis.  6. The inferior vena cava is normal in size with greater than 50% respiratory variability, suggesting right atrial pressure of 3 mmHg. FINDINGS  Left Ventricle: Left ventricular ejection fraction, by estimation, is 60 to 65%. The left ventricle has normal function. The left ventricle has no regional wall motion abnormalities. The left ventricular internal cavity size was normal in size. There is  no left ventricular hypertrophy. Left ventricular diastolic parameters are consistent with Grade I diastolic dysfunction (impaired relaxation). Right Ventricle: The right ventricular size is normal. Right ventricular systolic function is normal. Left Atrium: Left atrial size was mildly dilated. Right Atrium: Right atrial size was normal in size. Pericardium: There is no evidence of pericardial effusion. Mitral Valve: The mitral valve is normal in structure. Trivial mitral valve regurgitation. No evidence of mitral valve stenosis. Tricuspid Valve: The tricuspid valve is normal in structure. Tricuspid valve regurgitation is trivial. No evidence of tricuspid stenosis. Aortic Valve: The aortic valve is tricuspid. Aortic valve regurgitation is not visualized. Mild to moderate aortic valve sclerosis/calcification is present, without any evidence of aortic stenosis. Pulmonic Valve: The pulmonic valve was not well visualized. Pulmonic valve regurgitation is not visualized. No evidence of pulmonic stenosis. Aorta: The aortic root is normal in size and structure. Venous: The inferior vena cava is normal in size with greater than 50% respiratory variability, suggesting right atrial pressure of 3 mmHg. IAS/Shunts: No atrial level shunt detected by color  flow Doppler.  LEFT VENTRICLE PLAX 2D LVIDd:         4.30 cm   Diastology LVIDs:         2.80 cm   LV e' medial:    6.31 cm/s LV PW:         0.90 cm   LV E/e' medial:  12.6 LV IVS:        1.00 cm   LV e' lateral:   7.51 cm/s LVOT diam:     1.80 cm   LV E/e' lateral: 10.6 LV SV:         67 LV SV Index:   36 LVOT Area:     2.54 cm  RIGHT VENTRICLE RV S prime:     12.80 cm/s TAPSE (M-mode): 1.6 cm LEFT ATRIUM  Index        RIGHT ATRIUM           Index LA Vol (A2C):   81.6 ml 43.19 ml/m  RA Area:     17.50 cm LA Vol (A4C):   59.7 ml 31.60 ml/m  RA Volume:   54.50 ml  28.84 ml/m LA Biplane Vol: 71.9 ml 38.05 ml/m  AORTIC VALVE LVOT Vmax:   126.00 cm/s LVOT Vmean:  79.600 cm/s LVOT VTI:    0.265 m  AORTA Ao Root diam: 3.10 cm MITRAL VALVE               TRICUSPID VALVE MV Area (PHT): 2.13 cm    TR Peak grad:   18.3 mmHg MV Decel Time: 356 msec    TR Vmax:        214.00 cm/s MV E velocity: 79.30 cm/s MV A velocity: 74.60 cm/s  SHUNTS MV E/A ratio:  1.06        Systemic VTI:  0.26 m                            Systemic Diam: 1.80 cm Kirk Ruths MD Electronically signed by Kirk Ruths MD Signature Date/Time: 01/05/2021/3:24:38 PM    Final    CT Angio Chest/Abd/Pel for Dissection W and/or Wo Contrast  Result Date: 01/04/2021 CLINICAL DATA:  Chest and back pain EXAM: CT ANGIOGRAPHY CHEST, ABDOMEN AND PELVIS TECHNIQUE: Non-contrast CT of the chest was initially obtained. Multidetector CT imaging through the chest, abdomen and pelvis was performed using the standard protocol during bolus administration of intravenous contrast. Multiplanar reconstructed images and MIPs were obtained and reviewed to evaluate the vascular anatomy. CONTRAST:  52mL OMNIPAQUE IOHEXOL 350 MG/ML SOLN COMPARISON:  01/06/2021, 10/25/2020 FINDINGS: CTA CHEST FINDINGS Cardiovascular: The heart is unremarkable without pericardial effusion. No evidence of thoracic aortic aneurysm or dissection. No evidence of pulmonary embolus.  There is moderate atherosclerosis throughout the LAD distribution of the coronary vasculature. Mediastinum/Nodes: No enlarged mediastinal, hilar, or axillary lymph nodes. Thyroid gland, trachea, and esophagus demonstrate no significant findings. Lungs/Pleura: No acute airspace disease, effusion, or pneumothorax. Central airways are patent. Musculoskeletal: No acute or destructive bony lesions. Reconstructed images demonstrate no additional findings. Review of the MIP images confirms the above findings. CTA ABDOMEN AND PELVIS FINDINGS VASCULAR Aorta: Normal caliber aorta without aneurysm, dissection, vasculitis or significant stenosis. Celiac: Patent without evidence of aneurysm, dissection, vasculitis or significant stenosis. SMA: Eccentric atheromatous plaque at the origin of the SMA is chronic, with approximate 50% stenosis. No evidence of acute dissection or vasculitis. No evidence of aneurysm. Renals: Both renal arteries are patent without evidence of aneurysm, dissection, vasculitis, fibromuscular dysplasia or significant stenosis. IMA: Patent without evidence of aneurysm, dissection, vasculitis or significant stenosis. Mild narrowing at the origin unchanged. Inflow: Patent without evidence of aneurysm, dissection, vasculitis or significant stenosis. Mild atherosclerosis. Veins: No obvious venous abnormality within the limitations of this arterial phase study. Review of the MIP images confirms the above findings. NON-VASCULAR Hepatobiliary: Small calcified gallstones are identified without cholecystitis. No focal liver abnormality. Pancreas: Unremarkable. No pancreatic ductal dilatation or surrounding inflammatory changes. Spleen: Normal in size without focal abnormality. Adrenals/Urinary Tract: Postsurgical changes are seen from prior right ureteral reimplantation. There is persistent right-sided hydronephrosis and hydroureter, with severe right renal cortical atrophy unchanged. Stable left kidney, with  multiple renal cysts again noted. No left-sided obstruction. Bladder is stable. The adrenals are unremarkable. Stomach/Bowel: No bowel  obstruction or ileus. Normal appendix right lower quadrant. Scattered colonic diverticulosis without diverticulitis. Lymphatic: No pathologic adenopathy within the abdomen or pelvis. Reproductive: Stable enlargement of the prostate. Other: No free fluid or free gas. Stable fat containing umbilical hernias. Musculoskeletal: No acute or destructive bony lesions. Reconstructed images demonstrate no additional findings. Review of the MIP images confirms the above findings. IMPRESSION: 1. No evidence of thoracoabdominal aortic aneurysm or dissection. 2. No evidence of pulmonary embolus. 3. Moderate coronary artery atherosclerosis. 4. Cholelithiasis without cholecystitis. 5. Chronic right renal atrophy with stable right hydronephrosis and hydroureter related to prior right ureteral surgery for reimplantation. 6. Stable enlargement of the prostate. 7.  Aortic Atherosclerosis (ICD10-I70.0). Electronically Signed   By: Randa Ngo M.D.   On: 01/04/2021 21:46   (Echo, Carotid, EGD, Colonoscopy, ERCP)    Subjective: He agrees to have stress test   Discharge Exam: Vitals:   01/07/21 1251 01/07/21 1253  BP: (!) 162/98 (!) 157/107  Pulse:    Resp:    Temp:    SpO2:       General: Pt is alert, awake, not in acute distress Cardiovascular: RRR, S1/S2 +, no rubs, no gallops Respiratory: CTA bilaterally, no wheezing, no rhonchi Abdominal: Soft, NT, ND, bowel sounds + Extremities: no edema, no cyanosis    The results of significant diagnostics from this hospitalization (including imaging, microbiology, ancillary and laboratory) are listed below for reference.     Microbiology: Recent Results (from the past 240 hour(s))  Resp Panel by RT-PCR (Flu A&B, Covid) Nasopharyngeal Swab     Status: None   Collection Time: 01/05/21  1:49 AM   Specimen: Nasopharyngeal Swab;  Nasopharyngeal(NP) swabs in vial transport medium  Result Value Ref Range Status   SARS Coronavirus 2 by RT PCR NEGATIVE NEGATIVE Final    Comment: (NOTE) SARS-CoV-2 target nucleic acids are NOT DETECTED.  The SARS-CoV-2 RNA is generally detectable in upper respiratory specimens during the acute phase of infection. The lowest concentration of SARS-CoV-2 viral copies this assay can detect is 138 copies/mL. A negative result does not preclude SARS-Cov-2 infection and should not be used as the sole basis for treatment or other patient management decisions. A negative result may occur with  improper specimen collection/handling, submission of specimen other than nasopharyngeal swab, presence of viral mutation(s) within the areas targeted by this assay, and inadequate number of viral copies(<138 copies/mL). A negative result must be combined with clinical observations, patient history, and epidemiological information. The expected result is Negative.  Fact Sheet for Patients:  EntrepreneurPulse.com.au  Fact Sheet for Healthcare Providers:  IncredibleEmployment.be  This test is no t yet approved or cleared by the [place] FDA and  has been authorized for detection and/or diagnosis of SARS-CoV-2 by FDA under an Emergency Use Authorization (EUA). This EUA will remain  in effect (meaning this test can be used) for the duration of the COVID-19 declaration under Section 564(b)(1) of the Act, 21 U.S.C.section 360bbb-3(b)(1), unless the authorization is terminated  or revoked sooner.       Influenza A by PCR NEGATIVE NEGATIVE Final   Influenza B by PCR NEGATIVE NEGATIVE Final    Comment: (NOTE) The Xpert Xpress SARS-CoV-2/FLU/RSV plus assay is intended as an aid in the diagnosis of influenza from Nasopharyngeal swab specimens and should not be used as a sole basis for treatment. Nasal washings and aspirates are unacceptable for Xpert Xpress  SARS-CoV-2/FLU/RSV testing.  Fact Sheet for Patients: EntrepreneurPulse.com.au  Fact Sheet for Healthcare Providers: IncredibleEmployment.be  This test is not yet approved or cleared by the [place] and has been authorized for detection and/or diagnosis of SARS-CoV-2 by FDA under an Emergency Use Authorization (EUA). This EUA will remain in effect (meaning this test can be used) for the duration of the COVID-19 declaration under Section 564(b)(1) of the Act, 21 U.S.C. section 360bbb-3(b)(1), unless the authorization is terminated or revoked.  Performed at [facility], [place] [address], [place], [place] [address]      Labs: BNP (last 3 results) Recent Labs    01/04/21 1554  BNP 85.2   Basic Metabolic Panel: Recent Labs  Lab 01/04/21 1650 01/05/21 1130 01/06/21 0758  NA 135 134* 137  K 4.6 4.8 4.7  CL 100 102 106  CO2 23 23 21*  GLUCOSE 217* 148* 124*  BUN 41* 36* 30*  CREATININE 1.77* 1.65* 1.52*  CALCIUM 9.7 8.9 9.5   Liver Function Tests: No results for input(s): AST, ALT, ALKPHOS, BILITOT, PROT, ALBUMIN in the last 168 hours. No results for input(s): LIPASE, AMYLASE in the last 168 hours. No results for input(s): AMMONIA in the last 168 hours. CBC: Recent Labs  Lab 01/04/21 1650 01/06/21 0758  WBC 10.0 7.1  NEUTROABS 8.3*  --   HGB 18.0* 18.3*  HCT 53.8* 55.0*  MCV 86.4 88.1  PLT 223 196   Cardiac Enzymes: No results for input(s): CKTOTAL, CKMB, CKMBINDEX, TROPONINI in the last 168 hours. BNP: Invalid input(s): POCBNP CBG: Recent Labs  Lab 01/06/21 1149 01/06/21 1953 01/06/21 2357 01/07/21 0421 01/07/21 0818  GLUCAP 167* 156* 131* 138* 199*   D-Dimer No results for input(s): DDIMER in the last 72 hours. Hgb A1c No results for input(s): HGBA1C in the last 72 hours. Lipid Profile No results for input(s): CHOL, HDL, LDLCALC, TRIG, CHOLHDL, LDLDIRECT in the last 72 hours. Thyroid function  studies No results for input(s): TSH, T4TOTAL, T3FREE, THYROIDAB in the last 72 hours.  Invalid input(s): FREET3 Anemia work up No results for input(s): VITAMINB12, FOLATE, FERRITIN, TIBC, IRON, RETICCTPCT in the last 72 hours. Urinalysis    Component Value Date/Time   COLORURINE YELLOW 11/11/2020 1325   APPEARANCEUR Clear 12/12/2020 1046   LABSPEC 1.016 11/11/2020 1325   PHURINE 6.0 11/11/2020 1325   GLUCOSEU 2+ (A) 12/12/2020 1046   HGBUR NEGATIVE 11/11/2020 1325   BILIRUBINUR Negative 12/12/2020 1046   KETONESUR NEGATIVE 11/11/2020 1325   PROTEINUR 2+ (A) 12/12/2020 1046   PROTEINUR 30 (A) 11/11/2020 1325   UROBILINOGEN 0.2 08/26/2019 1516   UROBILINOGEN 0.2 04/18/2008 1153   NITRITE Negative 12/12/2020 1046   NITRITE NEGATIVE 11/11/2020 1325   LEUKOCYTESUR Negative 12/12/2020 1046   LEUKOCYTESUR NEGATIVE 11/11/2020 1325   Sepsis Labs Invalid input(s): PROCALCITONIN,  WBC,  LACTICIDVEN Microbiology Recent Results (from the past 240 hour(s))  Resp Panel by RT-PCR (Flu A&B, Covid) Nasopharyngeal Swab     Status: None   Collection Time: 01/05/21  1:49 AM   Specimen: Nasopharyngeal Swab; Nasopharyngeal(NP) swabs in vial transport medium  Result Value Ref Range Status   SARS Coronavirus 2 by RT PCR NEGATIVE NEGATIVE Final    Comment: (NOTE) SARS-CoV-2 target nucleic acids are NOT DETECTED.  The SARS-CoV-2 RNA is generally detectable in upper respiratory specimens during the acute phase of infection. The lowest concentration of SARS-CoV-2 viral copies this assay can detect is 138 copies/mL. A negative result does not preclude SARS-Cov-2 infection and should not be used as the sole basis for treatment or other patient management decisions.  A negative result may occur with  improper specimen collection/handling, submission of specimen other than nasopharyngeal swab, presence of viral mutation(s) within the areas targeted by this assay, and inadequate number of  viral copies(<138 copies/mL). A negative result must be combined with clinical observations, patient history, and epidemiological information. The expected result is Negative.  Fact Sheet for Patients:  EntrepreneurPulse.com.au  Fact Sheet for Healthcare Providers:  IncredibleEmployment.be  This test is no t yet approved or cleared by the [place] FDA and  has been authorized for detection and/or diagnosis of SARS-CoV-2 by FDA under an Emergency Use Authorization (EUA). This EUA will remain  in effect (meaning this test can be used) for the duration of the COVID-19 declaration under Section 564(b)(1) of the Act, 21 U.S.C.section 360bbb-3(b)(1), unless the authorization is terminated  or revoked sooner.       Influenza A by PCR NEGATIVE NEGATIVE Final   Influenza B by PCR NEGATIVE NEGATIVE Final    Comment: (NOTE) The Xpert Xpress SARS-CoV-2/FLU/RSV plus assay is intended as an aid in the diagnosis of influenza from Nasopharyngeal swab specimens and should not be used as a sole basis for treatment. Nasal washings and aspirates are unacceptable for Xpert Xpress SARS-CoV-2/FLU/RSV testing.  Fact Sheet for Patients: EntrepreneurPulse.com.au  Fact Sheet for Healthcare Providers: IncredibleEmployment.be  This test is not yet approved or cleared by the [place] FDA and has been authorized for detection and/or diagnosis of SARS-CoV-2 by FDA under an Emergency Use Authorization (EUA). This EUA will remain in effect (meaning this test can be used) for the duration of the COVID-19 declaration under Section 564(b)(1) of the Act, 21 U.S.C. section 360bbb-3(b)(1), unless the authorization is terminated or revoked.  Performed at [facility], [place] [address] [place], [place] [address]      Time coordinating discharge: 40 minutes  SIGNED:   Elmarie Shiley, MD  [facility]

## 2021-01-07 NOTE — Progress Notes (Addendum)
   [name] presented for a lexiscan cardiolite today.  Initially patient was not agreeable to complete testing. After discussion with myself, and primary MD Dr. Tyrell Antonio he did finally consent. No immediate complications.  Stress imaging is pending at this time.  Reino Bellis, NP 01/07/2021, 12:59 PM

## 2021-01-07 NOTE — Progress Notes (Signed)
Up to BR with stand by assist. C/O dizziness and lightheadedness. Sat on floor in BR w/o fall or syncope. Assisted up to sitting position and back to bed with moderate assist. BP = 156/104, HR = 72 in SR, O2 sats = 95% on RA. C/O left side facial tingling/numbness w/o s/s focal deficit. RRT and Dr Tyrell Antonio notified of pt c/o. Dr Tyrell Antonio into room at this time to examine pt. Orders received. Will hold Amlodipine until pt has results to r/o possible stroke per Dr Tyrell Antonio request.

## 2021-01-07 NOTE — Consult Note (Signed)
[name], [name], Randy able to give details" Referring Physician:  Dr. Tyrell Antonio Patient Identification: CLEARNCE LEJA MRN:  [id number] Principal Diagnosis: Chest pain Diagnosis:  Principal Problem:   Chest pain Active Problems:   Benign hypertension   Chronic kidney disease (CKD), [name])   HLD (hyperlipidemia)   Total Time spent with patient: 30 minutes  Subjective:   JAYMIAN BOGART is a 65 y.o. male with a Randy of reported depression, bipolar patient admitted with chest pain. Patient has been recommended to have a stress test as part of his workup; however, patient is now requesting to leave AMA. Psychiatry was consulted for capacity to leave AMA on 10/22 as patient has been noted to be confused and disoriented by staff members. At that time he was found not to have capacity as he was unable to display understanding and appreciation of risks.    Today,  Patient seen and chart reviewed. He received PRN seroquel 25 mg yesterday evening and early this morning . Most recent CIWA 3. Patient is AAO to person, month, year, and situation in interview. He is calm and cooperative. Upon entering room, patient immediately states "I'm ready to get my stress test". He denies SI/HI/AVH. He reports that he slept well. Discussed stress test again- today, he is able to demonstrate appreciation and understanding of risks and benefits of stress test. (He was unable to display appreciation and udnerstanding of risks of refusal yesterday).   On my interview, patient is in NAD, alert, oriented, calm, cooperative, and attentive, with normal affect, speech, and behavior. Objectively, there is no evidence of psychosis/ mania (able to converse coherently, linear and goal directed thought, no RIS, no distractibility, not pre-occupied, no FOI, etc) nor depression to the point of  suicidality (able to concentrate, affect full and reactive, speech normal r/v/t, no psychomotor retardation/agitation, etc).    Risk to Self:  no Risk to Others:  no Prior Inpatient Therapy:  no Prior Outpatient Therapy:  yes  Past Medical Randy:  Past Medical Randy:  Diagnosis Date   Acute kidney injury superimposed on chronic kidney disease ([place]) 02/10/2020   Acute on chronic kidney failure ([place]) 09/28/2019   Closed nondisplaced spiral fracture of shaft of right tibia    Complicated urinary tract infection 09/06/2016   Depression    Diabetes mellitus without complication (HCC)    GERD (gastroesophageal reflux disease)    Gout    right foot   Hemorrhoids    Hypertension    Hypertension    Macular degeneration    Microcytic anemia    Prostate cancer ([place]) 08/2009   Rectal bleeding 07/31/2016   Tubular adenoma     Past Surgical Randy:  Procedure Laterality Date   CYSTOSCOPY WITH RETROGRADE PYELOGRAM, URETEROSCOPY AND STENT PLACEMENT Right 07/31/2016   Procedure: CYSTOSCOPY WITH RIGHT  RETROGRADE PYELOGRAM, URETEROSCOPY;  Surgeon: [name], MD;  Location: [facility];  Service: Urology;  Laterality: Right;   INGUINAL HERNIA REPAIR     Left ankle joint fusion  1981   PROSTATE BIOPSY     x 2   URETERAL REIMPLANTION  07/31/2016   Procedure: URETERAL REIMPLANT, right boari flap, right psoas hitch;  Surgeon: [name], MD;  Location: [facility];  Service: Urology;;   Randy Randy:  Randy Randy  Problem Relation Age of Onset   Lung cancer Mother  Deceased   Throat cancer Brother    Pancreatic cancer Father    Heart disease Father        Deceased   Lung cancer Maternal Uncle        nephew   Diabetes Other    Randy Psychiatric  Randy: unknown Social Randy:  Social Randy   Substance and Sexual Activity  Alcohol Use Yes   Comment: ocassionally     Social Randy   Substance and Sexual Activity  Drug Use No    Social Randy    Socioeconomic Randy   Marital status: Divorced    Spouse name: Not on file   Number of children: 0   Years of education: Not on file   Highest education level: Not on file  Occupational Randy   Occupation: retired  Tobacco Use   Smoking status: Never   Smokeless tobacco: Never  Vaping Use   Vaping Use: Never used  Substance and Sexual Activity   Alcohol use: Yes    Comment: ocassionally   Drug use: No   Sexual activity: Yes  Other Topics Concern   Not on file  Social Randy Narrative   Not on file   Social Determinants of Health   Financial Resource Strain: Not on file  Food Insecurity: Not on file  Transportation Needs: Not on file  Physical Activity: Not on file  Stress: Not on file  Social Connections: Not on file   Additional Social Randy:    Allergies:   Allergies  Allergen Reactions   Haldol [Haloperidol]     Per Randy cause life threatening complications to patient    Labs:  Results for orders placed or performed during the hospital encounter of 01/04/21 (from the past 48 hour(s))  Glucose, capillary     Status: Abnormal   Collection Time: 01/05/21  4:46 PM  Result Value Ref Range   Glucose-Capillary 133 (H) 70 - 99 mg/dL    Comment: Glucose reference range applies only to samples taken after fasting for at least 8 hours.  Glucose, capillary     Status: Abnormal   Collection Time: 01/05/21  8:04 PM  Result Value Ref Range   Glucose-Capillary 268 (H) 70 - 99 mg/dL    Comment: Glucose reference range applies only to samples taken after fasting for at least 8 hours.  Glucose, capillary     Status: Abnormal   Collection Time: 01/06/21 12:07 AM  Result Value Ref Range   Glucose-Capillary 156 (H) 70 - 99 mg/dL    Comment: Glucose reference range applies only to samples taken after fasting for at least 8 hours.  Glucose, capillary     Status: Abnormal   Collection Time: 01/06/21  7:22 AM  Result Value Ref Range   Glucose-Capillary 117 (H) 70 -  99 mg/dL    Comment: Glucose reference range applies only to samples taken after fasting for at least 8 hours.  CBC     Status: Abnormal   Collection Time: 01/06/21  7:58 AM  Result Value Ref Range   WBC 7.1 4.0 - 10.5 K/uL   RBC 6.24 (H) 4.22 - 5.81 MIL/uL   Hemoglobin 18.3 (H) 13.0 - 17.0 g/dL   HCT 55.0 (H) 39.0 - 52.0 %   MCV 88.1 80.0 - 100.0 fL   MCH 29.3 26.0 - 34.0 pg   MCHC 33.3 30.0 - 36.0 g/dL   RDW 13.1 11.5 - 15.5 %   Platelets 196 150 - 400 K/uL   nRBC 0.0  0.0 - 0.2 %    Comment: Performed at [facility], [place] [address], [place], [place] [address]  Basic metabolic panel     Status: Abnormal   Collection Time: 01/06/21  7:58 AM  Result Value Ref Range   Sodium 137 135 - 145 mmol/L   Potassium 4.7 3.5 - 5.1 mmol/L   Chloride 106 98 - 111 mmol/L   CO2 21 (L) 22 - 32 mmol/L   Glucose, Bld 124 (H) 70 - 99 mg/dL    Comment: Glucose reference range applies only to samples taken after fasting for at least 8 hours.   BUN 30 (H) 8 - 23 mg/dL   Creatinine, Ser 1.52 (H) 0.61 - 1.24 mg/dL   Calcium 9.5 8.9 - 10.3 mg/dL   GFR, Estimated 50 (L) >60 mL/min    Comment: (NOTE) Calculated using the CKD-EPI Creatinine Equation (2021)    Anion gap 10 5 - 15    Comment: Performed at [facility] [address], [place], [place] [address]  Troponin I (High Sensitivity)     Status: None   Collection Time: 01/06/21  8:07 AM  Result Value Ref Range   Troponin I (High Sensitivity) 5 <18 ng/L    Comment: (NOTE) Elevated high sensitivity troponin I (hsTnI) values and significant  changes across serial measurements may suggest ACS but many other  chronic and acute conditions are known to elevate hsTnI results.  Refer to the "Links" section for chest pain algorithms and additional  guidance. Performed at [facility], [place] [address], [place], [place] [address]   Troponin I (High Sensitivity)     Status: None   Collection Time: 01/06/21 10:31 AM  Result Value Ref  Range   Troponin I (High Sensitivity) 6 <18 ng/L    Comment: (NOTE) Elevated high sensitivity troponin I (hsTnI) values and significant  changes across serial measurements may suggest ACS but many other  chronic and acute conditions are known to elevate hsTnI results.  Refer to the "Links" section for chest pain algorithms and additional  guidance. Performed at [facility], [place] [address] [place], [place] [address]   Glucose, capillary     Status: Abnormal   Collection Time: 01/06/21 11:49 AM  Result Value Ref Range   Glucose-Capillary 167 (H) 70 - 99 mg/dL    Comment: Glucose reference range applies only to samples taken after fasting for at least 8 hours.  Glucose, capillary     Status: Abnormal   Collection Time: 01/06/21  7:53 PM  Result Value Ref Range   Glucose-Capillary 156 (H) 70 - 99 mg/dL    Comment: Glucose reference range applies only to samples taken after fasting for at least 8 hours.  Glucose, capillary     Status: Abnormal   Collection Time: 01/06/21 11:57 PM  Result Value Ref Range   Glucose-Capillary 131 (H) 70 - 99 mg/dL    Comment: Glucose reference range applies only to samples taken after fasting for at least 8 hours.  Glucose, capillary     Status: Abnormal   Collection Time: 01/07/21  4:21 AM  Result Value Ref Range   Glucose-Capillary 138 (H) 70 - 99 mg/dL    Comment: Glucose reference range applies only to samples taken after fasting for at least 8 hours.  Glucose, capillary     Status: Abnormal   Collection Time: 01/07/21  8:18 AM  Result Value Ref Range   Glucose-Capillary 199 (H) 70 -  99 mg/dL    Comment: Glucose reference range applies only to samples taken after fasting for at least 8 hours.    Current Facility-Administered Medications  Medication Dose Route Frequency Provider Last Rate Last Admin   0.9 %  sodium chloride infusion  250 mL Intravenous PRN Sande Rives E, PA-C       acetaminophen (TYLENOL) tablet 650 mg  650 mg Oral  Q6H PRN Shela Leff, MD   650 mg at 01/05/21 5102   Or   acetaminophen (TYLENOL) suppository 650 mg  650 mg Rectal Q6H PRN Shela Leff, MD       alfuzosin (UROXATRAL) 24 hr tablet 10 mg  10 mg Oral Q breakfast Donnamae Jude, MD       ARIPiprazole (ABILIFY) tablet 2 mg  2 mg Oral Daily Donnamae Jude, MD   2 mg at 01/07/21 5852   aspirin EC tablet 81 mg  81 mg Oral Daily Shela Leff, MD   81 mg at 01/07/21 0831   atorvastatin (LIPITOR) tablet 80 mg  80 mg Oral Daily Donnamae Jude, MD   80 mg at 01/07/21 0831   empagliflozin (JARDIANCE) tablet 10 mg  10 mg Oral Daily Donnamae Jude, MD   10 mg at 01/07/21 7782   finasteride (PROSCAR) tablet 5 mg  5 mg Oral Daily Donnamae Jude, MD   5 mg at 42/35/36 1443   folic acid (FOLVITE) tablet 1 mg  1 mg Oral Daily Shela Leff, MD   1 mg at 01/06/21 1727   heparin injection 5,000 Units  5,000 Units Subcutaneous Q8H Shela Leff, MD   5,000 Units at 01/07/21 0435   insulin aspart (novoLOG) injection 0-9 Units  0-9 Units Subcutaneous Q4H Shela Leff, MD   2 Units at 01/07/21 0430   LORazepam (ATIVAN) tablet 1-4 mg  1-4 mg Oral Q1H PRN Shela Leff, MD   2 mg at 01/06/21 1404   Or   LORazepam (ATIVAN) injection 1-4 mg  1-4 mg Intravenous Q1H PRN Shela Leff, MD   2 mg at 01/06/21 0137   metoprolol tartrate (LOPRESSOR) tablet 50 mg  50 mg Oral BID Donnamae Jude, MD   50 mg at 01/07/21 1540   multivitamin with minerals tablet 1 tablet  1 tablet Oral Daily Shela Leff, MD   1 tablet at 01/06/21 1726   nitroGLYCERIN (NITROGLYN) 2 % ointment 0.5 inch  0.5 inch Topical Q6H PRN Sande Rives E, PA-C       pantoprazole (PROTONIX) EC tablet 40 mg  40 mg Oral BID Regalado, Belkys A, MD   40 mg at 01/07/21 0832   QUEtiapine (SEROQUEL) tablet 25 mg  25 mg Oral BID PRN Regalado, Belkys A, MD   25 mg at 01/07/21 0431   regadenoson (LEXISCAN) 0.4 MG/5ML injection SOLN            regadenoson (LEXISCAN)  injection SOLN 0.4 mg  0.4 mg Intravenous Once Cheryln Manly, NP       Semaglutide TABS 1 tablet  1 tablet Oral Daily Jerilynn Birkenhead, RPH       sodium chloride flush (NS) 0.9 % injection 3 mL  3 mL Intravenous Q12H Sarajane Jews, Callie E, PA-C   3 mL at 01/07/21 0837   sodium chloride flush (NS) 0.9 % injection 3 mL  3 mL Intravenous PRN Sande Rives E, PA-C       thiamine tablet 100 mg  100 mg Oral Daily Shela Leff, MD  100 mg at 01/06/21 1727   Or   thiamine (B-1) injection 100 mg  100 mg Intravenous Daily Shela Leff, MD       traZODone (DESYREL) tablet 50 mg  50 mg Oral QHS PRN Regalado, Belkys A, MD        Musculoskeletal: Strength & Muscle Tone: within normal limits Gait & Station: normal Patient leans: N/A            Psychiatric Specialty Exam:  Presentation  General Appearance: Appropriate for Environment; Casual  Eye Contact:Good  Speech:Clear and Coherent; Normal Rate  Speech Volume:Normal  Handedness:Right   Mood and Affect  Mood:Euthymic  Affect:Congruent   Thought Process  Thought Processes:Coherent; Goal Directed; Linear  Descriptions of Associations:Intact  Orientation:Full (Time, Place and Person)  Thought Content:WDL; Logical  Randy of Schizophrenia/Schizoaffective disorder:No  Duration of Psychotic Symptoms:No data recorded Hallucinations:Hallucinations: None Ideas of Reference:None  Suicidal Thoughts:Suicidal Thoughts: No Homicidal Thoughts:Homicidal Thoughts: No  Sensorium  Memory:Immediate Good; Recent Good; Remote Fair  Judgment:Fair  Insight:Fair   Executive Functions  Concentration:Good  Attention Span:Good  Petronila of Knowledge:Good  Language:Good   Psychomotor Activity  Psychomotor Activity:Psychomotor Activity: Normal  Assets  Assets:Communication Skills; Desire for Improvement; Resilience; Housing   Sleep  Sleep:Sleep: Fair  Physical Exam: Physical  Exam Constitutional:      Appearance: He is well-developed.  HENT:     Head: Normocephalic and atraumatic.  Neurological:     Mental Status: He is alert.   Review of Systems  Respiratory:  Negative for cough and shortness of breath.   Cardiovascular:  Negative for chest pain.  Psychiatric/Behavioral:  Negative for depression, hallucinations and suicidal ideas.   Blood pressure (!) 160/90, pulse 65, temperature 97.7 F (36.5 C), temperature source Oral, resp. rate 16, height 5\' 6"  (1.676 m), weight 79.4 kg, SpO2 98 %. Body mass index is 28.25 kg/m.  Treatment Plan Summary: 65 yo male with a Randy of AUD and reported bipolar depression who presented to the ED for CP and was admitted for further work up. Patient requesting to leave AMA at this time. Patient was noted to be confused and disoriented by staff members and psychiatry was consulted for assistance. Most recent 54 of 91. On interview patient is AAO x 4 however is unable to demonstrate understanding and appreciation of risks of leaving AMA at this time  10/23 Today patient is AAO x 4. He is overall more calm and able to demonstrate understanding and appreciation of leaving AMA. Currently, patient is agreeable to stress test; however, should patient change his mind, he would currently have capacity to leave AMA.   Recommendations -continue abilify 2 mg -agree with PRN seroquel -agree with  CIWA protocol  -currently has capacity to leave [facility]  -psychiatry will sign off at this time, no further recommendations  -discussed recommendations with Dr. Sherryl Manges via epic secure chat.    Disposition: No evidence of imminent risk to self or others at present.   Patient does not meet criteria for psychiatric inpatient admission.  Ival Bible, MD 01/07/2021 12:26 PM

## 2021-01-07 NOTE — Progress Notes (Signed)
Progress Note  Patient Name: MARKICE TORBERT Date of Encounter: 01/07/2021  [facility] Cardiologist: None   Subjective   No acute events overnight.   Patient agitated yesterday so stress cancelled. Psychiatry consulted and found him to NOT have capacity to leave AMA.  Inpatient Medications    Scheduled Meds:  alfuzosin  10 mg Oral Q breakfast   ARIPiprazole  2 mg Oral Daily   aspirin EC  81 mg Oral Daily   atorvastatin  80 mg Oral Daily   empagliflozin  10 mg Oral Daily   finasteride  5 mg Oral Daily   folic acid  1 mg Oral Daily   heparin injection (subcutaneous)  5,000 Units Subcutaneous Q8H   insulin aspart  0-9 Units Subcutaneous Q4H   LORazepam  1 mg Intramuscular STAT   metoprolol tartrate  50 mg Oral BID   multivitamin with minerals  1 tablet Oral Daily   pantoprazole  40 mg Oral BID   Semaglutide  1 tablet Oral Daily   sodium chloride flush  3 mL Intravenous Q12H   thiamine  100 mg Oral Daily   Or   thiamine  100 mg Intravenous Daily   Continuous Infusions:  sodium chloride     PRN Meds: sodium chloride, acetaminophen **OR** acetaminophen, LORazepam **OR** LORazepam, nitroGLYCERIN, QUEtiapine, sodium chloride flush, traZODone   Vital Signs    Vitals:   01/06/21 2010 01/06/21 2030 01/06/21 2358 01/07/21 0423  BP: (!) 144/102 122/89 (!) 108/56 131/84  Pulse: 90  (!) 58   Resp: 18  18 18   Temp: 97.7 F (36.5 C)  98 F (36.7 C) 97.7 F (36.5 C)  TempSrc: Oral  Oral Oral  SpO2: 100%  99%   Weight:      Height:        Intake/Output Summary (Last 24 hours) at 01/07/2021 0832 Last data filed at 01/07/2021 0400 Gross per 24 hour  Intake --  Output 250 ml  Net -250 ml    Last 3 Weights 01/04/2021 12/12/2020 12/06/2020  Weight (lbs) 175 lb 178 lb 178 lb 9.6 oz  Weight (kg) 79.379 kg 80.74 kg 81.012 kg      Telemetry    Personally Reviewed  ECG    No new.  Personally Reviewed  Physical Exam   GEN: No acute distress. Neck: No  JVD Cardiac: RRR, no murmurs, rubs, or gallops.  Respiratory: Clear to auscultation bilaterally. GI: Soft, nontender, non-distended  MS: No edema; No deformity. Neuro:  Nonfocal  Psych: Normal affect   Labs    High Sensitivity Troponin:   Recent Labs  Lab 01/04/21 1650 01/04/21 1754 01/05/21 0245 01/06/21 0807 01/06/21 1031  TROPONINIHS 8 7 9 5 6       Chemistry Recent Labs  Lab 01/04/21 1650 01/05/21 1130 01/06/21 0758  NA 135 134* 137  K 4.6 4.8 4.7  CL 100 102 106  CO2 23 23 21*  GLUCOSE 217* 148* 124*  BUN 41* 36* 30*  CREATININE 1.77* 1.65* 1.52*  CALCIUM 9.7 8.9 9.5  GFRNONAA 42* 46* 50*  ANIONGAP 12 9 10      Lipids No results for input(s): CHOL, TRIG, HDL, LABVLDL, LDLCALC, CHOLHDL in the last 168 hours.  Hematology Recent Labs  Lab 01/04/21 1650 01/06/21 0758  WBC 10.0 7.1  RBC 6.23* 6.24*  HGB 18.0* 18.3*  HCT 53.8* 55.0*  MCV 86.4 88.1  MCH 28.9 29.3  MCHC 33.5 33.3  RDW 13.1 13.1  PLT 223 196  Thyroid No results for input(s): TSH, FREET4 in the last 168 hours.  BNP Recent Labs  Lab 01/04/21 1554  BNP 13.2     DDimer No results for input(s): DDIMER in the last 168 hours.   Radiology    ECHOCARDIOGRAM COMPLETE  Result Date: 01/05/2021    ECHOCARDIOGRAM REPORT   Patient Name:   SHEILA GERVASI Date of Exam: 01/05/2021 Medical Rec #:  [id number]    Height:       66.0 in Accession #:    [id number]   Weight:       175.0 lb Date of Birth:  [birth year]    BSA:          1.889 m Patient Age:    30 years     BP:           173/109 mmHg Patient Gender: M            HR:           60 bpm. Exam Location:  Inpatient Procedure: 2D Echo, Cardiac Doppler and Color Doppler Indications:    chest pain  History:        Patient has no prior history of Echocardiogram examinations.                 Risk Factors:Hypertension, Diabetes and Dyslipidemia.  Sonographer:    MB Referring Phys: [id number] [place]  1. Left ventricular ejection fraction, by  estimation, is 60 to 65%. The left ventricle has normal function. The left ventricle has no regional wall motion abnormalities. Left ventricular diastolic parameters are consistent with Grade I diastolic dysfunction (impaired relaxation).  2. Right ventricular systolic function is normal. The right ventricular size is normal.  3. Left atrial size was mildly dilated.  4. The mitral valve is normal in structure. Trivial mitral valve regurgitation. No evidence of mitral stenosis.  5. The aortic valve is tricuspid. Aortic valve regurgitation is not visualized. Mild to moderate aortic valve sclerosis/calcification is present, without any evidence of aortic stenosis.  6. The inferior vena cava is normal in size with greater than 50% respiratory variability, suggesting right atrial pressure of 3 mmHg. FINDINGS  Left Ventricle: Left ventricular ejection fraction, by estimation, is 60 to 65%. The left ventricle has normal function. The left ventricle has no regional wall motion abnormalities. The left ventricular internal cavity size was normal in size. There is  no left ventricular hypertrophy. Left ventricular diastolic parameters are consistent with Grade I diastolic dysfunction (impaired relaxation). Right Ventricle: The right ventricular size is normal. Right ventricular systolic function is normal. Left Atrium: Left atrial size was mildly dilated. Right Atrium: Right atrial size was normal in size. Pericardium: There is no evidence of pericardial effusion. Mitral Valve: The mitral valve is normal in structure. Trivial mitral valve regurgitation. No evidence of mitral valve stenosis. Tricuspid Valve: The tricuspid valve is normal in structure. Tricuspid valve regurgitation is trivial. No evidence of tricuspid stenosis. Aortic Valve: The aortic valve is tricuspid. Aortic valve regurgitation is not visualized. Mild to moderate aortic valve sclerosis/calcification is present, without any evidence of aortic stenosis.  Pulmonic Valve: The pulmonic valve was not well visualized. Pulmonic valve regurgitation is not visualized. No evidence of pulmonic stenosis. Aorta: The aortic root is normal in size and structure. Venous: The inferior vena cava is normal in size with greater than 50% respiratory variability, suggesting right atrial pressure of 3 mmHg. IAS/Shunts: No atrial level shunt detected by color flow Doppler.  LEFT  VENTRICLE PLAX 2D LVIDd:         4.30 cm   Diastology LVIDs:         2.80 cm   LV e' medial:    6.31 cm/s LV PW:         0.90 cm   LV E/e' medial:  12.6 LV IVS:        1.00 cm   LV e' lateral:   7.51 cm/s LVOT diam:     1.80 cm   LV E/e' lateral: 10.6 LV SV:         67 LV SV Index:   36 LVOT Area:     2.54 cm  RIGHT VENTRICLE RV S prime:     12.80 cm/s TAPSE (M-mode): 1.6 cm LEFT ATRIUM             Index        RIGHT ATRIUM           Index LA Vol (A2C):   81.6 ml 43.19 ml/m  RA Area:     17.50 cm LA Vol (A4C):   59.7 ml 31.60 ml/m  RA Volume:   54.50 ml  28.84 ml/m LA Biplane Vol: 71.9 ml 38.05 ml/m  AORTIC VALVE LVOT Vmax:   126.00 cm/s LVOT Vmean:  79.600 cm/s LVOT VTI:    0.265 m  AORTA Ao Root diam: 3.10 cm MITRAL VALVE               TRICUSPID VALVE MV Area (PHT): 2.13 cm    TR Peak grad:   18.3 mmHg MV Decel Time: 356 msec    TR Vmax:        214.00 cm/s MV E velocity: 79.30 cm/s MV A velocity: 74.60 cm/s  SHUNTS MV E/A ratio:  1.06        Systemic VTI:  0.26 m                            Systemic Diam: 1.80 cm Kirk Ruths MD Electronically signed by Kirk Ruths MD Signature Date/Time: 01/05/2021/3:24:38 PM    Final     Cardiac Studies   No new    Assessment & Plan    Mr. Vastine is a 65 year old man with CKD 3A, diabetes, hypertension, hyperlipidemia, alcohol abuse, tobacco abuse who was admitted with chest pain.  CTA performed to evaluate for pulmonary embolism showed moderate coronary artery atherosclerosis.  Stress test is planned.  #Chest pain Continue aspirin 81 mg by mouth once  daily Continue atorvastatin 80 Continue metoprolol twice daily Stress test has been ordered, scheduled for today  #CKD 3A  #Diabetes Sliding scale insulin  #Alcohol abuse CIWA protocol thiamine Psychiatry following.    Signed, Vickie Epley, MD  01/07/2021, 8:32 AM

## 2021-01-08 ENCOUNTER — Telehealth: Payer: Self-pay

## 2021-01-08 DIAGNOSIS — R079 Chest pain, unspecified: Secondary | ICD-10-CM | POA: Diagnosis not present

## 2021-01-08 DIAGNOSIS — I2510 Atherosclerotic heart disease of native coronary artery without angina pectoris: Secondary | ICD-10-CM | POA: Diagnosis not present

## 2021-01-08 DIAGNOSIS — I10 Essential (primary) hypertension: Secondary | ICD-10-CM | POA: Diagnosis not present

## 2021-01-08 DIAGNOSIS — N1831 Chronic kidney disease, stage 3a: Secondary | ICD-10-CM | POA: Diagnosis not present

## 2021-01-08 DIAGNOSIS — E1159 Type 2 diabetes mellitus with other circulatory complications: Secondary | ICD-10-CM | POA: Diagnosis not present

## 2021-01-08 LAB — GLUCOSE, CAPILLARY
Glucose-Capillary: 142 mg/dL — ABNORMAL HIGH (ref 70–99)
Glucose-Capillary: 148 mg/dL — ABNORMAL HIGH (ref 70–99)
Glucose-Capillary: 172 mg/dL — ABNORMAL HIGH (ref 70–99)

## 2021-01-08 NOTE — Discharge Summary (Signed)
Physician Discharge Summary  FARIS COOLMAN OVF:[id number] DOB: 20-Dec-1955 DOA: 01/04/2021  PCP: [name], [name]  Admit date: 01/04/2021 Discharge date: 01/08/2021  Admitted From: Home  Disposition:  Home   Recommendations for Outpatient Follow-up:  Follow up with PCP in 1-2 weeks Please obtain BMP/CBC in one week BP medication management.  Follow renal function.    Discharge Condition: Stable.  CODE STATUS: FUll code Diet recommendation: Heart Healthy / Carb Modified   Brief/Interim Summary: 65 year old past medical history significant for diabetes type 2, hypertension, hyperlipidemia, chronic CKD stage IIIa, prostate cancer, recurrent urinary obstruction history of alcohol abuse who presents complaining of chest pain.  He had negative troponin.  CTA negative for PE, moderate coronary artery atherosclerosis.  Plan is for stress test     1-Chest pain;  Received Nitropaste as needed Plan for stress test today but patient was agitated unable to undergo procedure.  Plan for stress test tomorrow Continue with aspirin statins and metoprolol Discharge on PPI Patient agree to stay overnight on 10/22. Underwent stress test, which was low risk. Ok to discharge home per cardiology.  Resolved.    2-CKD stage IIIa: Continue to monitor Stable cr 1.5 hold benicar at discharge.    3-Noninsulin-dependent Diabetes type II Hemoglobin A1c 8.4.  Continue with sliding scale Continue with Jardiance   Hypertension: Continue with metoprolol, added norvasc. Discontinue benicar due to CKD   Hyperlipidemia continue with statin Alcohol abuse: per family patient doesn't drink that much alcohol.    Chronic polycythemia: Follow erythropoietin   Dizziness; resolved. MRI negative for stroke, Face numbness; resolved. MRI negative.,   Stable for discharge.   Discharge Diagnoses:  Principal Problem:   Chest pain Active Problems:   Benign hypertension   Chronic kidney disease (CKD), stage III  (moderate) (HCC)   Type 2 diabetes mellitus (HCC)   HLD (hyperlipidemia)    Discharge Instructions  Discharge Instructions     Diet - low sodium heart healthy   Complete by: As directed    Diet - low sodium heart healthy   Complete by: As directed    Increase activity slowly   Complete by: As directed    Increase activity slowly   Complete by: As directed       Allergies as of 01/08/2021       Reactions   Haldol [haloperidol]    Per family cause life threatening complications to patient        Medication List     STOP taking these medications    olmesartan-hydrochlorothiazide 40-25 MG tablet Commonly known as: BENICAR HCT   sildenafil 20 MG tablet Commonly known as: REVATIO   traMADol 50 MG tablet Commonly known as: ULTRAM       TAKE these medications    acetaminophen 325 MG tablet Commonly known as: TYLENOL Take 2 tablets (650 mg total) by mouth every 6 (six) hours as needed for mild pain (or Fever >/= 101).   alfuzosin 10 MG 24 hr tablet Commonly known as: UROXATRAL Take 1 tablet (10 mg total) by mouth daily with breakfast.   amLODipine 5 MG tablet Commonly known as: NORVASC Take 1 tablet (5 mg total) by mouth daily.   ARIPiprazole 2 MG tablet Commonly known as: ABILIFY TAKE 1 TABLET BY MOUTH EVERY DAY   aspirin EC 81 MG tablet Take 81 mg by mouth daily with breakfast.   atorvastatin 40 MG tablet Commonly known as: LIPITOR 1 tablet by mouth Monday - Friday What changed:  how much  to take how to take this when to take this additional instructions   clotrimazole-betamethasone cream Commonly known as: LOTRISONE Apply 1 application topically 2 (two) times daily.   diclofenac Sodium 1 % Gel Commonly known as: Voltaren Apply 4 g topically 4 (four) times daily.   empagliflozin 10 MG Tabs tablet Commonly known as: JARDIANCE Take 10 mg by mouth daily.   finasteride 5 MG tablet Commonly known as: PROSCAR Take 1 tablet (5 mg total) by  mouth daily.   metoprolol tartrate 50 MG tablet Commonly known as: LOPRESSOR Take 1 tablet (50 mg total) by mouth 2 (two) times daily.   multivitamin with minerals Tabs tablet Take 1 tablet by mouth daily.   nystatin powder Commonly known as: nystatin Apply 1 application topically 3 (three) times daily.   pantoprazole 40 MG tablet Commonly known as: PROTONIX Take 1 tablet (40 mg total) by mouth 2 (two) times daily.   Rybelsus 14 MG Tabs Generic drug: Semaglutide Take 1 tablet by mouth daily. Take 30 minutes before breakfast   traZODone 50 MG tablet Commonly known as: DESYREL TAKE 1 TABLET (50 MG TOTAL) BY MOUTH AT BEDTIME AS NEEDED FOR SLEEP.   VISINE OP Place 1 drop into both eyes daily as needed (itching/irritation).        Allergies  Allergen Reactions   Haldol [Haloperidol]     Per family cause life threatening complications to patient    Consultations: Cardiology    Procedures/Studies: DG Chest 2 View  Result Date: 01/04/2021 CLINICAL DATA:  Chest pain EXAM: CHEST - 2 VIEW COMPARISON:  Chest radiograph 11/11/2020 FINDINGS: The cardiomediastinal silhouette is stable. There is no focal consolidation or pulmonary edema. There is no pleural effusion or pneumothorax. Scarring in the lateral left base is unchanged. There is no acute osseous abnormality. IMPRESSION: No radiographic evidence of acute cardiopulmonary process. Electronically Signed   By: Valetta Mole M.D.   On: 01/04/2021 17:00   MR BRAIN WO CONTRAST  Result Date: 01/07/2021 CLINICAL DATA:  Dizziness, nonspecific. EXAM: MRI HEAD WITHOUT CONTRAST TECHNIQUE: Multiplanar, multiecho pulse sequences of the brain and surrounding structures were obtained without intravenous contrast. COMPARISON:  MRI 10/25/2020 FINDINGS: Brain: Diffusion imaging does not show any acute or subacute infarction or other cause of restricted diffusion. No focal abnormality affects the brainstem. Cerebellar volume loss without focal  insult. Cerebral hemispheres show generalized atrophy with moderate to severe chronic small-vessel ischemic changes throughout the white matter. No large vessel territory stroke. No mass lesion, hemorrhage, hydrocephalus or extra-axial collection. No discernible change since the study of August. Vascular: Major vessels at the base of the brain show flow. Skull and upper cervical spine: Negative Sinuses/Orbits: Clear/normal Other: No fluid in the middle ears. Very tiny mastoid effusions, not likely significant. IMPRESSION: No change since August of this year. No acute or reversible finding. Moderate to severe chronic small-vessel ischemic changes of the cerebral hemispheric white matter. Electronically Signed   By: Nelson Chimes M.D.   On: 01/07/2021 18:19   NM Myocar Multi W/Spect W/Wall Motion / EF  Result Date: 01/07/2021 CLINICAL DATA:  65 year old male with history of chest pain. EXAM: MYOCARDIAL IMAGING WITH SPECT (REST AND PHARMACOLOGIC-STRESS) GATED LEFT VENTRICULAR WALL MOTION STUDY LEFT VENTRICULAR EJECTION FRACTION TECHNIQUE: Standard myocardial SPECT imaging was performed after resting intravenous injection of 10.1 mCi Tc-39m sestamibi. Subsequently, intravenous infusion of Lexiscan was performed under the supervision of the Cardiology staff. At peak effect of the drug, 30.2 mCi Tc-23m sestamibi was injected intravenously and  standard myocardial SPECT imaging was performed. Quantitative gated imaging was also performed to evaluate left ventricular wall motion, and estimate left ventricular ejection fraction. COMPARISON:  None. FINDINGS: Perfusion: No decreased activity in the left ventricle on stress imaging to suggest reversible ischemia or infarction. Wall Motion: Normal left ventricular wall motion. No left ventricular dilation. Left Ventricular Ejection Fraction: 58 % End diastolic volume 71 ml End systolic volume 30 ml IMPRESSION: 1. No reversible ischemia or infarction. 2. Normal left  ventricular wall motion. 3. Left ventricular ejection fraction 58% 4. Non invasive risk stratification*: Low *2012 Appropriate Use Criteria for Coronary Revascularization Focused Update: J Am Coll Cardiol. 0932;35(5):732-202. http://content.[email or web address].aspx?articleid=[email or web address] Electronically Signed   By: Ruthann Cancer M.D.   On: 01/07/2021 14:52   ECHOCARDIOGRAM COMPLETE  Result Date: 01/05/2021    ECHOCARDIOGRAM REPORT   Patient Name:   [name] Date of Exam: 01/05/2021 Medical Rec #:  [id number]    Height:       66.0 in Accession #:    [id number]   Weight:       175.0 lb Date of Birth:  07/05/1955    BSA:          1.889 m Patient Age:    65 years     BP:           173/109 mmHg Patient Gender: M            HR:           60 bpm. Exam Location:  Inpatient Procedure: 2D Echo, Cardiac Doppler and Color Doppler Indications:    chest pain  History:        Patient has no prior history of Echocardiogram examinations.                 Risk Factors:Hypertension, Diabetes and Dyslipidemia.  Sonographer:    MB Referring Phys: [id number] [place]  1. Left ventricular ejection fraction, by estimation, is 60 to 65%. The left ventricle has normal function. The left ventricle has no regional wall motion abnormalities. Left ventricular diastolic parameters are consistent with Grade I diastolic dysfunction (impaired relaxation).  2. Right ventricular systolic function is normal. The right ventricular size is normal.  3. Left atrial size was mildly dilated.  4. The mitral valve is normal in structure. Trivial mitral valve regurgitation. No evidence of mitral stenosis.  5. The aortic valve is tricuspid. Aortic valve regurgitation is not visualized. Mild to moderate aortic valve sclerosis/calcification is present, without any evidence of aortic stenosis.  6. The inferior vena cava is normal in size with greater than 50% respiratory variability, suggesting right atrial pressure of 3 mmHg. FINDINGS  Left  Ventricle: Left ventricular ejection fraction, by estimation, is 60 to 65%. The left ventricle has normal function. The left ventricle has no regional wall motion abnormalities. The left ventricular internal cavity size was normal in size. There is  no left ventricular hypertrophy. Left ventricular diastolic parameters are consistent with Grade I diastolic dysfunction (impaired relaxation). Right Ventricle: The right ventricular size is normal. Right ventricular systolic function is normal. Left Atrium: Left atrial size was mildly dilated. Right Atrium: Right atrial size was normal in size. Pericardium: There is no evidence of pericardial effusion. Mitral Valve: The mitral valve is normal in structure. Trivial mitral valve regurgitation. No evidence of mitral valve stenosis. Tricuspid Valve: The tricuspid valve is normal in structure. Tricuspid valve regurgitation is trivial. No evidence of tricuspid stenosis. Aortic Valve: The aortic valve is tricuspid.  Aortic valve regurgitation is not visualized. Mild to moderate aortic valve sclerosis/calcification is present, without any evidence of aortic stenosis. Pulmonic Valve: The pulmonic valve was not well visualized. Pulmonic valve regurgitation is not visualized. No evidence of pulmonic stenosis. Aorta: The aortic root is normal in size and structure. Venous: The inferior vena cava is normal in size with greater than 50% respiratory variability, suggesting right atrial pressure of 3 mmHg. IAS/Shunts: No atrial level shunt detected by color flow Doppler.  LEFT VENTRICLE PLAX 2D LVIDd:         4.30 cm   Diastology LVIDs:         2.80 cm   LV e' medial:    6.31 cm/s LV PW:         0.90 cm   LV E/e' medial:  12.6 LV IVS:        1.00 cm   LV e' lateral:   7.51 cm/s LVOT diam:     1.80 cm   LV E/e' lateral: 10.6 LV SV:         67 LV SV Index:   36 LVOT Area:     2.54 cm  RIGHT VENTRICLE RV S prime:     12.80 cm/s TAPSE (M-mode): 1.6 cm LEFT ATRIUM             Index         RIGHT ATRIUM           Index LA Vol (A2C):   81.6 ml 43.19 ml/m  RA Area:     17.50 cm LA Vol (A4C):   59.7 ml 31.60 ml/m  RA Volume:   54.50 ml  28.84 ml/m LA Biplane Vol: 71.9 ml 38.05 ml/m  AORTIC VALVE LVOT Vmax:   126.00 cm/s LVOT Vmean:  79.600 cm/s LVOT VTI:    0.265 m  AORTA Ao Root diam: 3.10 cm MITRAL VALVE               TRICUSPID VALVE MV Area (PHT): 2.13 cm    TR Peak grad:   18.3 mmHg MV Decel Time: 356 msec    TR Vmax:        214.00 cm/s MV E velocity: 79.30 cm/s MV A velocity: 74.60 cm/s  SHUNTS MV E/A ratio:  1.06        Systemic VTI:  0.26 m                            Systemic Diam: 1.80 cm Kirk Ruths MD Electronically signed by Kirk Ruths MD Signature Date/Time: 01/05/2021/3:24:38 PM    Final    CT Angio Chest/Abd/Pel for Dissection W and/or Wo Contrast  Result Date: 01/04/2021 CLINICAL DATA:  Chest and back pain EXAM: CT ANGIOGRAPHY CHEST, ABDOMEN AND PELVIS TECHNIQUE: Non-contrast CT of the chest was initially obtained. Multidetector CT imaging through the chest, abdomen and pelvis was performed using the standard protocol during bolus administration of intravenous contrast. Multiplanar reconstructed images and MIPs were obtained and reviewed to evaluate the vascular anatomy. CONTRAST:  90mL OMNIPAQUE IOHEXOL 350 MG/ML SOLN COMPARISON:  01/06/2021, 10/25/2020 FINDINGS: CTA CHEST FINDINGS Cardiovascular: The heart is unremarkable without pericardial effusion. No evidence of thoracic aortic aneurysm or dissection. No evidence of pulmonary embolus. There is moderate atherosclerosis throughout the LAD distribution of the coronary vasculature. Mediastinum/Nodes: No enlarged mediastinal, hilar, or axillary lymph nodes. Thyroid gland, trachea, and esophagus demonstrate no significant findings. Lungs/Pleura: No acute airspace disease, effusion,  or pneumothorax. Central airways are patent. Musculoskeletal: No acute or destructive bony lesions. Reconstructed images demonstrate no  additional findings. Review of the MIP images confirms the above findings. CTA ABDOMEN AND PELVIS FINDINGS VASCULAR Aorta: Normal caliber aorta without aneurysm, dissection, vasculitis or significant stenosis. Celiac: Patent without evidence of aneurysm, dissection, vasculitis or significant stenosis. SMA: Eccentric atheromatous plaque at the origin of the SMA is chronic, with approximate 50% stenosis. No evidence of acute dissection or vasculitis. No evidence of aneurysm. Renals: Both renal arteries are patent without evidence of aneurysm, dissection, vasculitis, fibromuscular dysplasia or significant stenosis. IMA: Patent without evidence of aneurysm, dissection, vasculitis or significant stenosis. Mild narrowing at the origin unchanged. Inflow: Patent without evidence of aneurysm, dissection, vasculitis or significant stenosis. Mild atherosclerosis. Veins: No obvious venous abnormality within the limitations of this arterial phase study. Review of the MIP images confirms the above findings. NON-VASCULAR Hepatobiliary: Small calcified gallstones are identified without cholecystitis. No focal liver abnormality. Pancreas: Unremarkable. No pancreatic ductal dilatation or surrounding inflammatory changes. Spleen: Normal in size without focal abnormality. Adrenals/Urinary Tract: Postsurgical changes are seen from prior right ureteral reimplantation. There is persistent right-sided hydronephrosis and hydroureter, with severe right renal cortical atrophy unchanged. Stable left kidney, with multiple renal cysts again noted. No left-sided obstruction. Bladder is stable. The adrenals are unremarkable. Stomach/Bowel: No bowel obstruction or ileus. Normal appendix right lower quadrant. Scattered colonic diverticulosis without diverticulitis. Lymphatic: No pathologic adenopathy within the abdomen or pelvis. Reproductive: Stable enlargement of the prostate. Other: No free fluid or free gas. Stable fat containing umbilical  hernias. Musculoskeletal: No acute or destructive bony lesions. Reconstructed images demonstrate no additional findings. Review of the MIP images confirms the above findings. IMPRESSION: 1. No evidence of thoracoabdominal aortic aneurysm or dissection. 2. No evidence of pulmonary embolus. 3. Moderate coronary artery atherosclerosis. 4. Cholelithiasis without cholecystitis. 5. Chronic right renal atrophy with stable right hydronephrosis and hydroureter related to prior right ureteral surgery for reimplantation. 6. Stable enlargement of the prostate. 7.  Aortic Atherosclerosis (ICD10-I70.0). Electronically Signed   By: Randa Ngo M.D.   On: 01/04/2021 21:46   (Echo, Carotid, EGD, Colonoscopy, ERCP)    Subjective: He agrees to have stress test   Discharge Exam: Vitals:   01/07/21 2044 01/08/21 0456  BP: 123/76 113/60  Pulse: 65 (!) 50  Resp: 18 18  Temp: 98.7 F (37.1 C) 97.9 F (36.6 C)  SpO2: 95% 98%     General: Pt is alert, awake, not in acute distress Cardiovascular: RRR, S1/S2 +, no rubs, no gallops Respiratory: CTA bilaterally, no wheezing, no rhonchi Abdominal: Soft, NT, ND, bowel sounds + Extremities: no edema, no cyanosis    The results of significant diagnostics from this hospitalization (including imaging, microbiology, ancillary and laboratory) are listed below for reference.     Microbiology: Recent Results (from the past 240 hour(s))  Resp Panel by RT-PCR (Flu A&B, Covid) Nasopharyngeal Swab     Status: None   Collection Time: 01/05/21  1:49 AM   Specimen: Nasopharyngeal Swab; Nasopharyngeal(NP) swabs in vial transport medium  Result Value Ref Range Status   SARS Coronavirus 2 by RT PCR NEGATIVE NEGATIVE Final    Comment: (NOTE) SARS-CoV-2 target nucleic acids are NOT DETECTED.  The SARS-CoV-2 RNA is generally detectable in upper respiratory specimens during the acute phase of infection. The lowest concentration of SARS-CoV-2 viral copies this assay can  detect is 138 copies/mL. A negative result does not preclude SARS-Cov-2 infection and should not be used  as the sole basis for treatment or other patient management decisions. A negative result may occur with  improper specimen collection/handling, submission of specimen other than nasopharyngeal swab, presence of viral mutation(s) within the areas targeted by this assay, and inadequate number of viral copies(<138 copies/mL). A negative result must be combined with clinical observations, patient history, and epidemiological information. The expected result is Negative.  Fact Sheet for Patients:  EntrepreneurPulse.com.au  Fact Sheet for Healthcare Providers:  IncredibleEmployment.be  This test is no t yet approved or cleared by the [place] FDA and  has been authorized for detection and/or diagnosis of SARS-CoV-2 by FDA under an Emergency Use Authorization (EUA). This EUA will remain  in effect (meaning this test can be used) for the duration of the COVID-19 declaration under Section 564(b)(1) of the Act, 21 U.S.C.section 360bbb-3(b)(1), unless the authorization is terminated  or revoked sooner.       Influenza A by PCR NEGATIVE NEGATIVE Final   Influenza B by PCR NEGATIVE NEGATIVE Final    Comment: (NOTE) The Xpert Xpress SARS-CoV-2/FLU/RSV plus assay is intended as an aid in the diagnosis of influenza from Nasopharyngeal swab specimens and should not be used as a sole basis for treatment. Nasal washings and aspirates are unacceptable for Xpert Xpress SARS-CoV-2/FLU/RSV testing.  Fact Sheet for Patients: EntrepreneurPulse.com.au  Fact Sheet for Healthcare Providers: IncredibleEmployment.be  This test is not yet approved or cleared by the [place] FDA and has been authorized for detection and/or diagnosis of SARS-CoV-2 by FDA under an Emergency Use Authorization (EUA). This EUA will remain in  effect (meaning this test can be used) for the duration of the COVID-19 declaration under Section 564(b)(1) of the Act, 21 U.S.C. section 360bbb-3(b)(1), unless the authorization is terminated or revoked.  Performed at [facility], [place] [address], [place],  [address]      Labs: BNP (last 3 results) Recent Labs    01/04/21 1554  BNP 67.6   Basic Metabolic Panel: Recent Labs  Lab 01/04/21 1650 01/05/21 1130 01/06/21 0758  NA 135 134* 137  K 4.6 4.8 4.7  CL 100 102 106  CO2 23 23 21*  GLUCOSE 217* 148* 124*  BUN 41* 36* 30*  CREATININE 1.77* 1.65* 1.52*  CALCIUM 9.7 8.9 9.5   Liver Function Tests: No results for input(s): AST, ALT, ALKPHOS, BILITOT, PROT, ALBUMIN in the last 168 hours. No results for input(s): LIPASE, AMYLASE in the last 168 hours. No results for input(s): AMMONIA in the last 168 hours. CBC: Recent Labs  Lab 01/04/21 1650 01/06/21 0758  WBC 10.0 7.1  NEUTROABS 8.3*  --   HGB 18.0* 18.3*  HCT 53.8* 55.0*  MCV 86.4 88.1  PLT 223 196   Cardiac Enzymes: No results for input(s): CKTOTAL, CKMB, CKMBINDEX, TROPONINI in the last 168 hours. BNP: Invalid input(s): POCBNP CBG: Recent Labs  Lab 01/07/21 1615 01/07/21 1952 01/08/21 0029 01/08/21 0502 01/08/21 0756  GLUCAP 238* 179* 172* 142* 148*   D-Dimer No results for input(s): DDIMER in the last 72 hours. Hgb A1c No results for input(s): HGBA1C in the last 72 hours. Lipid Profile No results for input(s): CHOL, HDL, LDLCALC, TRIG, CHOLHDL, LDLDIRECT in the last 72 hours. Thyroid function studies No results for input(s): TSH, T4TOTAL, T3FREE, THYROIDAB in the last 72 hours.  Invalid input(s): FREET3 Anemia work up Recent Labs    01/07/21 1634  VITAMINB12 823   Urinalysis    Component Value Date/Time   COLORURINE YELLOW 11/11/2020  [place] 12/12/2020 1046   LABSPEC 1.016 11/11/2020 1325   PHURINE 6.0 11/11/2020 1325   GLUCOSEU 2+ (A) 12/12/2020 1046    HGBUR NEGATIVE 11/11/2020 1325   BILIRUBINUR Negative 12/12/2020 1046   KETONESUR NEGATIVE 11/11/2020 1325   PROTEINUR 2+ (A) 12/12/2020 1046   PROTEINUR 30 (A) 11/11/2020 1325   UROBILINOGEN 0.2 08/26/2019 1516   UROBILINOGEN 0.2 04/18/2008 1153   NITRITE Negative 12/12/2020 1046   NITRITE NEGATIVE 11/11/2020 1325   LEUKOCYTESUR Negative 12/12/2020 1046   LEUKOCYTESUR NEGATIVE 11/11/2020 1325   Sepsis Labs Invalid input(s): PROCALCITONIN,  WBC,  LACTICIDVEN Microbiology Recent Results (from the past 240 hour(s))  Resp Panel by RT-PCR (Flu A&B, Covid) Nasopharyngeal Swab     Status: None   Collection Time: 01/05/21  1:49 AM   Specimen: Nasopharyngeal Swab; Nasopharyngeal(NP) swabs in vial transport medium  Result Value Ref Range Status   SARS Coronavirus 2 by RT PCR NEGATIVE NEGATIVE Final    Comment: (NOTE) SARS-CoV-2 target nucleic acids are NOT DETECTED.  The SARS-CoV-2 RNA is generally detectable in upper respiratory specimens during the acute phase of infection. The lowest concentration of SARS-CoV-2 viral copies this assay can detect is 138 copies/mL. A negative result does not preclude SARS-Cov-2 infection and should not be used as the sole basis for treatment or other patient management decisions. A negative result may occur with  improper specimen collection/handling, submission of specimen other than nasopharyngeal swab, presence of viral mutation(s) within the areas targeted by this assay, and inadequate number of viral copies(<138 copies/mL). A negative result must be combined with clinical observations, patient history, and epidemiological information. The expected result is Negative.  Fact Sheet for Patients:  EntrepreneurPulse.com.au  Fact Sheet for Healthcare Providers:  IncredibleEmployment.be  This test is no t yet approved or cleared by the [place] FDA and  has been authorized for detection and/or diagnosis of  SARS-CoV-2 by FDA under an Emergency Use Authorization (EUA). This EUA will remain  in effect (meaning this test can be used) for the duration of the COVID-19 declaration under Section 564(b)(1) of the Act, 21 U.S.C.section 360bbb-3(b)(1), unless the authorization is terminated  or revoked sooner.       Influenza A by PCR NEGATIVE NEGATIVE Final   Influenza B by PCR NEGATIVE NEGATIVE Final    Comment: (NOTE) The Xpert Xpress SARS-CoV-2/FLU/RSV plus assay is intended as an aid in the diagnosis of influenza from Nasopharyngeal swab specimens and should not be used as a sole basis for treatment. Nasal washings and aspirates are unacceptable for Xpert Xpress SARS-CoV-2/FLU/RSV testing.  Fact Sheet for Patients: EntrepreneurPulse.com.au  Fact Sheet for Healthcare Providers: IncredibleEmployment.be  This test is not yet approved or cleared by the [place] FDA and has been authorized for detection and/or diagnosis of SARS-CoV-2 by FDA under an Emergency Use Authorization (EUA). This EUA will remain in effect (meaning this test can be used) for the duration of the COVID-19 declaration under Section 564(b)(1) of the Act, 21 U.S.C. section [id number](b)(1), unless the authorization is terminated or revoked.  Performed at [facility], [place] [address], [place], [place] [address]      Time coordinating discharge: 40 minutes  SIGNED:   Elmarie Shiley, MD  [facility]

## 2021-01-08 NOTE — Progress Notes (Signed)
Attempted to complete admission assessment but patient stated he just wanted to go home and did not want to be asked questions

## 2021-01-08 NOTE — Telephone Encounter (Signed)
Called pt trying to complete TCM, pt answered refusing to be asked any questions. He did not " feel like talking had too much going on". Provider notified.

## 2021-01-08 NOTE — Progress Notes (Signed)
Progress Note  Patient Name: [name] Date of Encounter: 01/08/2021  [facility] Cardiologist: None   Subjective   Less than cooperative. Refusing telemetry. Normal nuclear perfusion study.  Inpatient Medications    Scheduled Meds:  alfuzosin  10 mg Oral Q breakfast   amLODipine  5 mg Oral Daily   ARIPiprazole  2 mg Oral Daily   aspirin EC  81 mg Oral Daily   atorvastatin  80 mg Oral Daily   empagliflozin  10 mg Oral Daily   finasteride  5 mg Oral Daily   folic acid  1 mg Oral Daily   heparin injection (subcutaneous)  5,000 Units Subcutaneous Q8H   insulin aspart  0-9 Units Subcutaneous Q4H   metoprolol tartrate  50 mg Oral BID   multivitamin with minerals  1 tablet Oral Daily   pantoprazole  40 mg Oral BID   regadenoson  0.4 mg Intravenous Once   sodium chloride flush  3 mL Intravenous Q12H   thiamine  100 mg Oral Daily   Or   thiamine  100 mg Intravenous Daily   Continuous Infusions:  sodium chloride     PRN Meds: sodium chloride, acetaminophen **OR** acetaminophen, nitroGLYCERIN, QUEtiapine, sodium chloride flush, traZODone   Vital Signs    Vitals:   01/07/21 1603 01/07/21 1830 01/07/21 2044 01/08/21 0456  BP: (!) 159/111 (!) 159/100 123/76 113/60  Pulse: 73 80 65 (!) 50  Resp: 15 16 18 18   Temp:  98.5 F (36.9 C) 98.7 F (37.1 C) 97.9 F (36.6 C)  TempSrc:  Oral Oral Oral  SpO2: 91% 93% 95% 98%  Weight:      Height:        Intake/Output Summary (Last 24 hours) at 01/08/2021 0823 Last data filed at 01/07/2021 2226 Gross per 24 hour  Intake 120 ml  Output 300 ml  Net -180 ml   Last 3 Weights 01/04/2021 12/12/2020 12/06/2020  Weight (lbs) 175 lb 178 lb 178 lb 9.6 oz  Weight (kg) 79.379 kg 80.74 kg 81.012 kg      Telemetry    NSR (when wearing) - Personally Reviewed  ECG   NSR, Q in III and aVF - Personally Reviewed  Physical Exam  Overweight. Not wanting to talk GEN: No acute distress.   Neck: No JVD Cardiac: RRR, no murmurs,  rubs, or gallops.  Respiratory: Clear to auscultation bilaterally. GI: Soft, nontender, non-distended  MS: No edema; No deformity. Neuro:  Nonfocal  Psych: Normal affect   Labs    High Sensitivity Troponin:   Recent Labs  Lab 01/04/21 1650 01/04/21 1754 01/05/21 0245 01/06/21 0807 01/06/21 1031  TROPONINIHS 8 7 9 5 6      Chemistry Recent Labs  Lab 01/04/21 1650 01/05/21 1130 01/06/21 0758  NA 135 134* 137  K 4.6 4.8 4.7  CL 100 102 106  CO2 23 23 21*  GLUCOSE 217* 148* 124*  BUN 41* 36* 30*  CREATININE 1.77* 1.65* 1.52*  CALCIUM 9.7 8.9 9.5  GFRNONAA 42* 46* 50*  ANIONGAP 12 9 10     Lipids No results for input(s): CHOL, TRIG, HDL, LABVLDL, LDLCALC, CHOLHDL in the last 168 hours.  Hematology Recent Labs  Lab 01/04/21 1650 01/06/21 0758  WBC 10.0 7.1  RBC 6.23* 6.24*  HGB 18.0* 18.3*  HCT 53.8* 55.0*  MCV 86.4 88.1  MCH 28.9 29.3  MCHC 33.5 33.3  RDW 13.1 13.1  PLT 223 196   Thyroid No results for input(s): TSH, FREET4  in the last 168 hours.  BNP Recent Labs  Lab 01/04/21 1554  BNP 13.2    DDimer No results for input(s): DDIMER in the last 168 hours.   Radiology    MR BRAIN WO CONTRAST  Result Date: 01/07/2021 CLINICAL DATA:  Dizziness, nonspecific. EXAM: MRI HEAD WITHOUT CONTRAST TECHNIQUE: Multiplanar, multiecho pulse sequences of the brain and surrounding structures were obtained without intravenous contrast. COMPARISON:  MRI 10/25/2020 FINDINGS: Brain: Diffusion imaging does not show any acute or subacute infarction or other cause of restricted diffusion. No focal abnormality affects the brainstem. Cerebellar volume loss without focal insult. Cerebral hemispheres show generalized atrophy with moderate to severe chronic small-vessel ischemic changes throughout the white matter. No large vessel territory stroke. No mass lesion, hemorrhage, hydrocephalus or extra-axial collection. No discernible change since the study of August. Vascular: Major vessels  at the base of the brain show flow. Skull and upper cervical spine: Negative Sinuses/Orbits: Clear/normal Other: No fluid in the middle ears. Very tiny mastoid effusions, not likely significant. IMPRESSION: No change since August of this year. No acute or reversible finding. Moderate to severe chronic small-vessel ischemic changes of the cerebral hemispheric white matter. Electronically Signed   By: Nelson Chimes M.D.   On: 01/07/2021 18:19   NM Myocar Multi W/Spect W/Wall Motion / EF  Result Date: 01/07/2021 CLINICAL DATA:  65 year old male with history of chest pain. EXAM: MYOCARDIAL IMAGING WITH SPECT (REST AND PHARMACOLOGIC-STRESS) GATED LEFT VENTRICULAR WALL MOTION STUDY LEFT VENTRICULAR EJECTION FRACTION TECHNIQUE: Standard myocardial SPECT imaging was performed after resting intravenous injection of 10.1 mCi Tc-54m sestamibi. Subsequently, intravenous infusion of Lexiscan was performed under the supervision of the Cardiology staff. At peak effect of the drug, 30.2 mCi Tc-73m sestamibi was injected intravenously and standard myocardial SPECT imaging was performed. Quantitative gated imaging was also performed to evaluate left ventricular wall motion, and estimate left ventricular ejection fraction. COMPARISON:  None. FINDINGS: Perfusion: No decreased activity in the left ventricle on stress imaging to suggest reversible ischemia or infarction. Wall Motion: Normal left ventricular wall motion. No left ventricular dilation. Left Ventricular Ejection Fraction: 58 % End diastolic volume 71 ml End systolic volume 30 ml IMPRESSION: 1. No reversible ischemia or infarction. 2. Normal left ventricular wall motion. 3. Left ventricular ejection fraction 58% 4. Non invasive risk stratification*: Low *2012 Appropriate Use Criteria for Coronary Revascularization Focused Update: J Am Coll Cardiol. 2542;70(6):237-628. http://content.airportbarriers.com.aspx?articleid=[email or web address] Electronically Signed   By: Ruthann Cancer  M.D.   On: 01/07/2021 14:52    Cardiac Studies  NUCLEAR MYOCARDIAL PERFUSION SCAN IMPRESSION: 1. No reversible ischemia or infarction.   2. Normal left ventricular wall motion.   3. Left ventricular ejection fraction 58%   4. Non invasive risk stratification*: Low    Patient Profile     65 y.o. male with CKD 3A, diabetes, hypertension, hyperlipidemia, alcohol abuse, tobacco abuse who was admitted with chest pain. CTA performed to evaluate for pulmonary embolism showed moderate coronary artery atherosclerosis.    Assessment & Plan    No plan for additional coronary evaluation at this time. Recommend risk factor modification (smoking cessation, LDL<70, BP 130/80 or less, good glycemic control preferably w SGLT@ in or GLP-1 agonists, preferential use of beta blockers for HTN, etc.)  [facility] will sign off.   Medication Recommendations:  continue current meds Other recommendations (labs, testing, etc):  repeat lipids and A1c in a few months Follow up as an outpatient:  prn  For questions or updates, please contact [facility] Please consult [email or web address] for contact info under        Signed, Sanda Klein, MD  01/08/2021, 8:23 AM

## 2021-01-08 NOTE — Telephone Encounter (Signed)
Transition Care Management Unsuccessful Follow-up Telephone Call  Date of discharge and from where:  01/04/2021 [facility]   Attempts:  1st Attempt  Reason for unsuccessful TCM follow-up call:  Unable to reach patient

## 2021-01-08 NOTE — Progress Notes (Signed)
I asked Pt refused to be hook up to the monitor for continuous telemetry monitoring

## 2021-01-10 ENCOUNTER — Emergency Department (HOSPITAL_COMMUNITY)
Admission: EM | Admit: 2021-01-10 | Discharge: 2021-01-10 | Disposition: A | Discharge status: Home / Self Care | Payer: Medicare Other | Attending: Emergency Medicine | Admitting: Emergency Medicine

## 2021-01-10 ENCOUNTER — Ambulatory Visit: Payer: Medicare Other | Admitting: Nurse Practitioner

## 2021-01-10 ENCOUNTER — Telehealth: Payer: Self-pay

## 2021-01-10 ENCOUNTER — Encounter (HOSPITAL_COMMUNITY): Payer: Self-pay | Admitting: *Deleted

## 2021-01-10 ENCOUNTER — Other Ambulatory Visit: Payer: Self-pay

## 2021-01-10 DIAGNOSIS — R0789 Other chest pain: Secondary | ICD-10-CM | POA: Diagnosis not present

## 2021-01-10 DIAGNOSIS — Z743 Need for continuous supervision: Secondary | ICD-10-CM | POA: Diagnosis not present

## 2021-01-10 DIAGNOSIS — Z5321 Procedure and treatment not carried out due to patient leaving prior to being seen by health care provider: Secondary | ICD-10-CM | POA: Insufficient documentation

## 2021-01-10 DIAGNOSIS — R079 Chest pain, unspecified: Secondary | ICD-10-CM | POA: Diagnosis not present

## 2021-01-10 DIAGNOSIS — I499 Cardiac arrhythmia, unspecified: Secondary | ICD-10-CM | POA: Diagnosis not present

## 2021-01-10 DIAGNOSIS — R739 Hyperglycemia, unspecified: Secondary | ICD-10-CM | POA: Diagnosis not present

## 2021-01-10 NOTE — ED Notes (Signed)
Pt stated that he was unable to wait and wanted to leave, ambulated out of triage.

## 2021-01-10 NOTE — ED Triage Notes (Signed)
Pt arrived by [facility] from home. Pt reports recently admitted for chest pain, has increase in stress level and chest pain increased. Hx of alcohol use and noncompliant with medicines. Denies SI or HI.

## 2021-01-10 NOTE — Chronic Care Management (AMB) (Signed)
Chronic Care Management Pharmacy Assistant   Name: [name]  MRN: [id number] DOB: 12-06-1955   Reason for Encounter: Disease State/ Diabetes  Recent office visits:  10-20-2020 [name] (CCM)   10-31-2020 [name] (CCM)  10-31-2020 Randy Logan, RN (CCM)  11-06-2020 [name] (CCM)  Recent consult visits:  11-03-2020 [name] (Radiology). Ultrasound or scrotum.  11-15-2020 Randy Espy, MD (Urology). STOP Myrbetric. START alfuzosin 10 mg daily. PSA= 15.6. Abnormal UA.  Hospital visits:  Medication Reconciliation was completed by comparing discharge summary, patient's EMR and Pharmacy list, and upon discussion with patient.   Admitted to the hospital on 10-02-2020 due to depression.  Discharge date was 10-03-2020. Discharged from [place]?Medications Started at [facility] Discharge:?? None   Medication Changes at Hospital Discharge: None   Medications Discontinued at Hospital Discharge: None   Medications that remain the same after Hospital Discharge:??  -All other medications will remain the same.    Hospital visits:  Medication Reconciliation was completed by comparing discharge summary, patient's EMR and Pharmacy list, and upon discussion with patient.   Admitted to the hospital on 10-25-2020 due to urinary retention.  Discharge date was 10-30-2020. Discharged from [place]?Medications Started at [facility] Discharge:?? None   Medication Changes at Hospital Discharge: None   Medications Discontinued at Hospital Discharge: None   Medications that remain the same after Hospital Discharge:??  -All other medications will remain the same.    Hospital visits:  Medication Reconciliation was completed by comparing discharge summary, patient's EMR and Pharmacy list, and upon discussion with patient.   Admitted to the hospital on 11-11-2020 due to frequent falls.  Discharge  date was 11-11-2020. Discharged from [place]?Medications Started at [facility] Discharge:?? None   Medication Changes at Hospital Discharge: None   Medications Discontinued at Hospital Discharge: None   Medications that remain the same after Hospital Discharge:??  -All other medications will remain the same.     Medications: Outpatient Encounter Medications as of 01/10/2021  Medication Sig   acetaminophen (TYLENOL) 325 MG tablet Take 2 tablets (650 mg total) by mouth every 6 (six) hours as needed for mild pain (or Fever >/= 101).   alfuzosin (UROXATRAL) 10 MG 24 hr tablet Take 1 tablet (10 mg total) by mouth daily with breakfast.   amLODipine (NORVASC) 5 MG tablet Take 1 tablet (5 mg total) by mouth daily.   ARIPiprazole (ABILIFY) 2 MG tablet TAKE 1 TABLET BY MOUTH EVERY DAY (Patient taking differently: Take 2 mg by mouth daily.)   aspirin EC 81 MG tablet Take 81 mg by mouth daily with breakfast.    atorvastatin (LIPITOR) 40 MG tablet 1 tablet by mouth Monday - Friday (Patient taking differently: Take 40 mg by mouth daily.)   clotrimazole-betamethasone (LOTRISONE) cream Apply 1 application topically 2 (two) times daily.   diclofenac Sodium (VOLTAREN) 1 % GEL Apply 4 g topically 4 (four) times daily.   empagliflozin (JARDIANCE) 10 MG TABS tablet Take 10 mg by mouth daily.   finasteride (PROSCAR) 5 MG tablet Take 1 tablet (5 mg total) by mouth daily.   metoprolol tartrate (LOPRESSOR) 50 MG tablet Take 1 tablet (50 mg total) by mouth 2 (two) times daily. (Patient not taking: No sig reported)   Multiple Vitamin (MULTIVITAMIN WITH MINERALS) TABS tablet Take 1 tablet by mouth daily.   nystatin (NYSTATIN) powder Apply 1 application  topically 3 (three) times daily.   pantoprazole (PROTONIX) 40 MG tablet Take 1 tablet (40 mg total) by mouth 2 (two) times daily.   Semaglutide (RYBELSUS) 14 MG TABS Take 1 tablet by mouth daily. Take 30 minutes before breakfast    Tetrahydrozoline HCl (VISINE OP) Place 1 drop into both eyes daily as needed (itching/irritation).   traZODone (DESYREL) 50 MG tablet TAKE 1 TABLET (50 MG TOTAL) BY MOUTH AT BEDTIME AS NEEDED FOR SLEEP.   No facility-administered encounter medications on file as of 01/10/2021.   Recent Relevant Labs: Lab Results  Component Value Date/Time   HGBA1C 8.4 (H) 12/06/2020 03:50 PM   HGBA1C 8.1 (H) 07/17/2020 02:50 PM   HGBA1C 7.6 06/26/2016 12:00 AM   HGBA1C 7.6 06/26/2016 12:00 AM   MICROALBUR 80 08/26/2019 03:15 PM   MICROALBUR 150 03/26/2018 11:57 AM    Kidney Function Lab Results  Component Value Date/Time   CREATININE 1.52 (H) 01/06/2021 07:58 AM   CREATININE 1.65 (H) 01/05/2021 11:30 AM   GFRNONAA 50 (L) 01/06/2021 07:58 AM   GFRAA 48 (L) 03/13/2020 11:39 AM      01-10-2021: 1st attempt for diabetes assessment. Someone picked up phone and was silent. Called patient back and patient stated he is depressed and didn't feel like talking because he just separated with his girlfriend. Asked patient did he need any help from me and he stated yes. Patient was in [place] parking lot pacing back and forth asking for help. Patient stated he doesn't want to harm himself but chest is hurting bad. Contacted [name] and she proceeded to call 911. Stayed on the phone with patient until the police arrived. Patient requested for someone to call his ex girlfriend [name] at [phone].  Care Gaps:  Star Rating Drugs: Rybelsus 14 mg- Last filled 12-07-2020 [facility] Olmesartan-HCTZ 40-25 mg- Last filled 11-30-2020 [facility] Atorvastatin 40 mg- Last filled 12-06-2020 [facility]  [place] Clinical Pharmacist Assistant [phone]

## 2021-01-11 ENCOUNTER — Telehealth: Payer: Self-pay

## 2021-01-11 ENCOUNTER — Telehealth: Payer: Medicare Other

## 2021-01-11 ENCOUNTER — Ambulatory Visit: Payer: Medicare Other

## 2021-01-11 ENCOUNTER — Emergency Department (HOSPITAL_COMMUNITY)
Admission: EM | Admit: 2021-01-11 | Discharge: 2021-01-11 | Disposition: A | Discharge status: Home / Self Care | Payer: Medicare Other | Attending: Physician Assistant | Admitting: Physician Assistant

## 2021-01-11 ENCOUNTER — Other Ambulatory Visit: Payer: Self-pay | Admitting: Nurse Practitioner

## 2021-01-11 ENCOUNTER — Encounter (HOSPITAL_COMMUNITY): Payer: Self-pay

## 2021-01-11 ENCOUNTER — Ambulatory Visit: Payer: Self-pay

## 2021-01-11 DIAGNOSIS — Z5321 Procedure and treatment not carried out due to patient leaving prior to being seen by health care provider: Secondary | ICD-10-CM | POA: Insufficient documentation

## 2021-01-11 DIAGNOSIS — E1169 Type 2 diabetes mellitus with other specified complication: Secondary | ICD-10-CM

## 2021-01-11 DIAGNOSIS — R079 Chest pain, unspecified: Secondary | ICD-10-CM | POA: Insufficient documentation

## 2021-01-11 DIAGNOSIS — E1121 Type 2 diabetes mellitus with diabetic nephropathy: Secondary | ICD-10-CM

## 2021-01-11 DIAGNOSIS — F32A Depression, unspecified: Secondary | ICD-10-CM | POA: Insufficient documentation

## 2021-01-11 DIAGNOSIS — Z743 Need for continuous supervision: Secondary | ICD-10-CM | POA: Diagnosis not present

## 2021-01-11 DIAGNOSIS — I10 Essential (primary) hypertension: Secondary | ICD-10-CM

## 2021-01-11 DIAGNOSIS — R739 Hyperglycemia, unspecified: Secondary | ICD-10-CM | POA: Diagnosis not present

## 2021-01-11 DIAGNOSIS — Z79899 Other long term (current) drug therapy: Secondary | ICD-10-CM

## 2021-01-11 DIAGNOSIS — N1831 Chronic kidney disease, stage 3a: Secondary | ICD-10-CM

## 2021-01-11 DIAGNOSIS — F331 Major depressive disorder, recurrent, moderate: Secondary | ICD-10-CM

## 2021-01-11 DIAGNOSIS — C61 Malignant neoplasm of prostate: Secondary | ICD-10-CM

## 2021-01-11 NOTE — Addendum Note (Signed)
Addended by: [name] F on: 01/11/2021 10:56 PM   Modules accepted: Orders

## 2021-01-11 NOTE — Chronic Care Management (AMB) (Signed)
Chronic Care Management    Social Work Note  01/11/2021 Name: [name] MRN: [id number] DOB: Apr 02, 1955  [name] is a 65 y.o. year old male who is a primary care patient of [name], [place]. The CCM team was consulted to assist the patient with chronic disease management and/or care coordination needs related to:  Depressive Disorder, CKD III, DM II, HTN .   Engaged with patient by telephone for follow up visit in response to provider referral for social work chronic care management and care coordination services.   Consent to Services:  The patient was given information about Chronic Care Management services, agreed to services, and gave verbal consent prior to initiation of services.  Please see initial visit note for detailed documentation.   Patient agreed to services and consent obtained.   Assessment: Review of patient past medical history, allergies, medications, and health status, including review of relevant consultants reports was performed today as part of a comprehensive evaluation and provision of chronic care management and care coordination services.     SDOH (Social Determinants of Health) assessments and interventions performed:    Advanced Directives Status: Not addressed in this encounter.  CCM Care Plan  Allergies  Allergen Reactions   Haldol [Haloperidol]     Per family cause life threatening complications to patient    Outpatient Encounter Medications as of 01/11/2021  Medication Sig   acetaminophen (TYLENOL) 325 MG tablet Take 2 tablets (650 mg total) by mouth every 6 (six) hours as needed for mild pain (or Fever >/= 101).   alfuzosin (UROXATRAL) 10 MG 24 hr tablet Take 1 tablet (10 mg total) by mouth daily with breakfast.   amLODipine (NORVASC) 5 MG tablet Take 1 tablet (5 mg total) by mouth daily.   ARIPiprazole (ABILIFY) 2 MG tablet TAKE 1 TABLET BY MOUTH EVERY DAY (Patient taking differently: Take 2 mg by mouth daily.)   aspirin EC 81 MG tablet  Take 81 mg by mouth daily with breakfast.    atorvastatin (LIPITOR) 40 MG tablet 1 tablet by mouth Monday - Friday (Patient taking differently: Take 40 mg by mouth daily.)   clotrimazole-betamethasone (LOTRISONE) cream Apply 1 application topically 2 (two) times daily.   diclofenac Sodium (VOLTAREN) 1 % GEL Apply 4 g topically 4 (four) times daily.   empagliflozin (JARDIANCE) 10 MG TABS tablet Take 10 mg by mouth daily.   finasteride (PROSCAR) 5 MG tablet Take 1 tablet (5 mg total) by mouth daily.   metoprolol tartrate (LOPRESSOR) 50 MG tablet Take 1 tablet (50 mg total) by mouth 2 (two) times daily. (Patient not taking: No sig reported)   Multiple Vitamin (MULTIVITAMIN WITH MINERALS) TABS tablet Take 1 tablet by mouth daily.   nystatin (NYSTATIN) powder Apply 1 application topically 3 (three) times daily.   pantoprazole (PROTONIX) 40 MG tablet Take 1 tablet (40 mg total) by mouth 2 (two) times daily.   Semaglutide (RYBELSUS) 14 MG TABS Take 1 tablet by mouth daily. Take 30 minutes before breakfast   Tetrahydrozoline HCl (VISINE OP) Place 1 drop into both eyes daily as needed (itching/irritation).   traZODone (DESYREL) 50 MG tablet TAKE 1 TABLET (50 MG TOTAL) BY MOUTH AT BEDTIME AS NEEDED FOR SLEEP.   No facility-administered encounter medications on file as of 01/11/2021.    Patient Active Problem List   Diagnosis Date Noted   Type 2 diabetes mellitus ([place]) 01/05/2021   HLD (hyperlipidemia) 01/05/2021   Fall 11/11/2020   Hypertensive urgency, malignant 10/25/2020  Acute urinary retention 10/25/2020   Adjustment disorder with mixed anxiety and depressed mood    Acute renal failure superimposed on stage 3a chronic kidney disease ([place]) 09/15/2020   Ataxia 08/01/2020   Dizziness 07/31/2020   Chest pain 07/31/2020   Hip pain 07/31/2020   AKI (acute kidney injury) ([place]) 02/11/2020   Hydroureteronephrosis 02/10/2020   Nausea and vomiting 02/10/2020   History of COVID-19 02/10/2020    Pneumonia due to COVID-19 virus 09/29/2019   Hyponatremia 09/28/2019   Sinus tachycardia 09/28/2019   Insomnia 08/26/2018   Nephropathy 03/26/2018   Urinary urgency 03/26/2018   Type 2 diabetes mellitus with diabetic nephropathy, without long-term current use of insulin ([place]) 03/26/2018   Essential hypertension 03/26/2018   Chronic kidney disease (CKD), stage III (moderate) ([place]) 03/26/2018   Conductive hearing loss, bilateral 11/07/2017   Uncontrolled type 2 diabetes mellitus with hyperglycemia ([place]) 07/31/2016   Dyslipidemia associated with type 2 diabetes mellitus ([place]) 05/29/2016   Microcytic anemia 02/17/2012   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   Benign hypertension 02/17/2012   Depressive disorder 02/17/2012    Conditions to be addressed/monitored: HTN, DMII, and Depression; Mental Health Concerns   Care Plan : Social Work [place]  Updates made by [name] since 01/11/2021 12:00 AM     Problem: Home and Family Safety (Wellness)      Goal: Patient Safety Concerns   Start Date: 10/20/2020  Expected End Date: 11/19/2020  Recent Progress: Not on track  Priority: High  Note:   Current Barriers:  Chronic disease management support and education needs related to HTN, DM, and CKD Stage III   8 ED visits during the month of July  Majority of which he left AMA - One ED visit he expressed depression due to fight with his girlfriend stating "he cannot be happy without her" per provider documentation the patient was screened and provided with outpatient [place] [facility] Unable to contact the patient - contact number is no longer working Patient was a no call no show for office visit with primary provider on 8.4.22  Social Worker Clinical Goal(s):  SW will contact [employer] department to request a welfare check based on above barriers SW will mail patient a letter requesting he contact a member of the care management team SW will collaborate with patients primary care  team as needed to discuss concerns for patient safety 01/11/21- Patient will seek care in the emergency department as directed by SW due to chest pain and SI  SW Interventions:  Inter-disciplinary care team collaboration (see longitudinal plan of care) Collaboration with [name], [name] regarding development and update of comprehensive plan of care as evidenced by provider attestation and co-signature Communication received from Glenna Durand, annual wellness visit nurse who indicates patient would not participate during today's virtual appointment stating he was "really depressed" and requested a call from this SW Successful outbound call placed to the patient to assess care coordination needs Simultaneous collaboration with primary care provider [name] [name] and RN Care Manager [place] via secure chat was performed during call to the patient Determined the patient is upset due to his lady friend wanting some time away from him. Patient reports he spoke with her this morning briefly and wants to speak with her again Patient became tearful on the phone stating "I'm very unhappy. I feel suicidal at times" SW assessed if patient is currently suicidal, patient stated "I might do something if I don't hear from her soon. Can you  call her for me" SW advised the patient this SW would like the patient to seek medical care for depression-patient agreed Reviewed primary care providers schedule- no openings until next week Discussed with the patient the opportunity to visit a local behavioral health urgent care which her has visited in the past Patient agreed to visit behavioral health then stated "well actually I would like to call the paramedics because I am having chest pain" Advised the patient SW would request RN Care Manager to call paramedics while the patient stayed on the phone with this Probation officer - patient agreed SW engaged with patient by phone while waiting on paramedics Discussed  importance of being honest in the ED with providers so he may receive the care that he needs - patient in agreement stating "I may need outpatient therapy" Paramedics arrived and patient requested to end the call in order to participate in assessment Encouraged the patient again to remain open and honest when discussing how he has been feeling in order to receive appropriate care SW will continue to monitor chart for patient follow up needs  Patient Goals/Self-Care Activities patient will:  -Seek care in the ED for self reported chest pain and depression with SI  Follow Up Plan:  SW will continue to follow       Follow Up Plan:  SW will continue to follow      [name], BSW, CDP Social Worker, Certified Dementia Practitioner [place] [facility] [phone]

## 2021-01-11 NOTE — Telephone Encounter (Signed)
This nurse called patient for our scheduled telephonic AWV. He stated that he was really depressed right now and did not feel like talking. He asked me to contact [facility] and ask her to call him. He also asked if I could ask Janece's nurse to call him as well. I sent a message  to [place]. She replied that she will try to fit him in today. I let Octavio Manns know about wanted Janece's nurse to call him.

## 2021-01-11 NOTE — ED Notes (Signed)
Pt refusing bloodwork and EKG

## 2021-01-11 NOTE — ED Triage Notes (Signed)
Pt BIBA from [facility]. Pt c/o depression. Pt denies any suicidal thoughts. Pt endorses slight chest pain.

## 2021-01-11 NOTE — ED Provider Notes (Signed)
Emergency Medicine Provider Triage Evaluation Note  [name] , a 65 y.o. male  was evaluated in triage.  Pt complains of depression and chest pain, no si.  Review of Systems  Positive: Depression, chest pain Negative: si  Physical Exam  BP (!) 137/95   Pulse 89   Temp 98.4 F (36.9 C)   Resp 16   Ht 5\' 6"  (1.676 m)   Wt 79.4 kg   SpO2 100%   BMI 28.25 kg/m  Gen:   Awake, no distress   Resp:  Normal effort  MSK:   Moves extremities without difficulty   Medical Decision Making  Medically screening exam initiated at 1:42 PM.  Appropriate orders placed.  [name] was informed that the remainder of the evaluation will be completed by another provider, this initial triage assessment does not replace that evaluation, and the importance of remaining in the ED until their evaluation is complete.     Bishop Dublin 01/11/21 1342    Blanchie Dessert, MD 01/11/21 [phone]

## 2021-01-11 NOTE — Patient Instructions (Signed)
Social Worker Visit Information  Goals we discussed today:   Goals Addressed             This Visit's Progress    Patient Safety Concerns       Timeframe:  Short-Term Goal Priority:  High Start Date:  8.5.22                                       Patient Goals/Self-Care Activities patient will:  -Seek care in the ED for self reported chest pain and depression with SI         The patient verbalized understanding of instructions, educational materials, and care plan provided today and declined offer to receive copy of patient instructions, educational materials, and care plan.   Follow Up Plan:  SW will continue to follow  [name], BSW, CDP Social Worker, Certified Dementia Practitioner [place] [facility] [place] [facility] [phone]

## 2021-01-11 NOTE — Telephone Encounter (Signed)
The pt was called and scheduled an appt for depression, the pt went to the er yesterday, he said he left because he didn't feel like staying in the ER it was to crowded.  The pt would like Tillie Rung to give him a call about going to an assisted living facility.the pt said he feels like if he is around other people he will start feeling better because he will have other people around him all the time.

## 2021-01-11 NOTE — Telephone Encounter (Signed)
Noted  

## 2021-01-12 NOTE — Patient Instructions (Signed)
Visit Information  PATIENT GOALS:  Goals Addressed      Harm or injury prevented   Not on track    Timeframe:  Short-Term Goal Priority:  High Start Date:  10/31/20                           Expected End Date:  05/03/21       Follow up date: 01/15/21      Patient Goals: -use positive distractions to help with effective coping when feeling down, sad or hopeless -notify your healthcare team promptly for worsening depression and or suicidal ideation occurs  -use your Mood and Health Zone Management Tool to help you determined when to call the doctor (will be mailed to your home address)            The patient verbalized understanding of instructions, educational materials, and care plan provided today and declined offer to receive copy of patient instructions, educational materials, and care plan.   Telephone follow up appointment with care management team member scheduled for: 01/15/21  Barb Merino, RN, BSN, CCM Care Management Coordinator [place] [facility]  Direct Phone: [phone]

## 2021-01-12 NOTE — Chronic Care Management (AMB) (Signed)
Chronic Care Management   CCM RN Visit Note  01/11/2021 Name: [name] MRN: [id number] DOB: [birth year]  Subjective: [name] is a 65 y.o. year old male who is a primary care patient of [name], [place]. The care management team was consulted for assistance with disease management and care coordination needs.    Engaged with patient by telephone for follow up visit in response to provider referral for case management and/or care coordination services.   Consent to Services:  The patient was given information about Chronic Care Management services, agreed to services, and gave verbal consent prior to initiation of services.  Please see initial visit note for detailed documentation.   Patient agreed to services and verbal consent obtained.   Assessment: Review of patient past medical history, allergies, medications, health status, including review of consultants reports, laboratory and other test data, was performed as part of comprehensive evaluation and provision of chronic care management services.   SDOH (Social Determinants of Health) assessments and interventions performed:    CCM Care Plan  Allergies  Allergen Reactions   Haldol [Haloperidol]     Per family cause life threatening complications to patient    Outpatient Encounter Medications as of 01/11/2021  Medication Sig   acetaminophen (TYLENOL) 325 MG tablet Take 2 tablets (650 mg total) by mouth every 6 (six) hours as needed for mild pain (or Fever >/= 101).   alfuzosin (UROXATRAL) 10 MG 24 hr tablet Take 1 tablet (10 mg total) by mouth daily with breakfast.   amLODipine (NORVASC) 5 MG tablet Take 1 tablet (5 mg total) by mouth daily.   ARIPiprazole (ABILIFY) 2 MG tablet TAKE 1 TABLET BY MOUTH EVERY DAY (Patient taking differently: Take 2 mg by mouth daily.)   aspirin EC 81 MG tablet Take 81 mg by mouth daily with breakfast.    atorvastatin (LIPITOR) 40 MG tablet 1 tablet by mouth Monday - Friday (Patient taking  differently: Take 40 mg by mouth daily.)   clotrimazole-betamethasone (LOTRISONE) cream Apply 1 application topically 2 (two) times daily.   diclofenac Sodium (VOLTAREN) 1 % GEL Apply 4 g topically 4 (four) times daily.   empagliflozin (JARDIANCE) 10 MG TABS tablet Take 10 mg by mouth daily.   finasteride (PROSCAR) 5 MG tablet Take 1 tablet (5 mg total) by mouth daily.   metoprolol tartrate (LOPRESSOR) 50 MG tablet Take 1 tablet (50 mg total) by mouth 2 (two) times daily. (Patient not taking: No sig reported)   Multiple Vitamin (MULTIVITAMIN WITH MINERALS) TABS tablet Take 1 tablet by mouth daily.   nystatin (NYSTATIN) powder Apply 1 application topically 3 (three) times daily.   pantoprazole (PROTONIX) 40 MG tablet Take 1 tablet (40 mg total) by mouth 2 (two) times daily.   Semaglutide (RYBELSUS) 14 MG TABS Take 1 tablet by mouth daily. Take 30 minutes before breakfast   Tetrahydrozoline HCl (VISINE OP) Place 1 drop into both eyes daily as needed (itching/irritation).   traZODone (DESYREL) 50 MG tablet TAKE 1 TABLET (50 MG TOTAL) BY MOUTH AT BEDTIME AS NEEDED FOR SLEEP.   No facility-administered encounter medications on file as of 01/11/2021.    Patient Active Problem List   Diagnosis Date Noted   Type 2 diabetes mellitus ([place]) 01/05/2021   HLD (hyperlipidemia) 01/05/2021   Fall 11/11/2020   Hypertensive urgency, malignant 10/25/2020   Acute urinary retention 10/25/2020   Adjustment disorder with mixed anxiety and depressed mood    Acute renal failure superimposed on stage 3a  chronic kidney disease ([place]) 09/15/2020   Ataxia 08/01/2020   Dizziness 07/31/2020   Chest pain 07/31/2020   Hip pain 07/31/2020   AKI (acute kidney injury) ([place]) 02/11/2020   Hydroureteronephrosis 02/10/2020   Nausea and vomiting 02/10/2020   History of COVID-19 02/10/2020   Pneumonia due to COVID-19 virus 09/29/2019   Hyponatremia 09/28/2019   Sinus tachycardia 09/28/2019   Insomnia 08/26/2018    Nephropathy 03/26/2018   Urinary urgency 03/26/2018   Type 2 diabetes mellitus with diabetic nephropathy, without long-term current use of insulin ([place]) 03/26/2018   Essential hypertension 03/26/2018   Chronic kidney disease (CKD), stage III (moderate) ([place]) 03/26/2018   Conductive hearing loss, bilateral 11/07/2017   Uncontrolled type 2 diabetes mellitus with hyperglycemia ([place]) 07/31/2016   Dyslipidemia associated with type 2 diabetes mellitus ([place]) 05/29/2016   Microcytic anemia 02/17/2012   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   Benign hypertension 02/17/2012   Depressive disorder 02/17/2012    Conditions to be addressed/monitored: CKD III, DM II, HTN, HLD, Depressive disorder, Prostate Cancer    Care Plan : Depression (Adult)  Updates made by Lynne Logan, RN since 01/11/2021 12:00 AM     Problem: Harm or Injury (Depression)   Priority: High     Long-Range Goal: Harm or Injury Prevented   Start Date: 10/31/2020  Expected End Date: 05/03/2021  Recent Progress: On track  Priority: High  Note:   Current Barriers:  Ineffective Self Health Maintenance in a patient with CKD III, DM II, HTN, HLD Does not adhere to provider recommendations re: Psychiatric follow up for depression  Does not attend all scheduled provider appointments Does not adhere to prescribed medication regimen Does not adhere to prescribed psychotropic medication regimen Lacks social connections Does not maintain contact with provider office Does not contact provider office for questions/concerns Clinical Goal(s):  Collaboration with [name], [place] regarding development and update of comprehensive plan of care as evidenced by provider attestation and co-signature Inter-disciplinary care team collaboration (see longitudinal plan of care) patient will work with care management team to address care coordination and chronic disease management needs related to Disease Management Educational Needs Care  Coordination Medication Management and Education Medication Reconciliation Psychosocial Support   Interventions:  01/11/21 Case Collaboration  Evaluation of current treatment plan related to CKD III, DM II, HTN, HLD, self-management and patient's adherence to plan as established by provider. Collaboration with [name], [name] regarding development and update of comprehensive plan of care as evidenced by provider attestation       and co-signature Inter-disciplinary care team collaboration (see longitudinal plan of care) Received urgent message from embedded BSW [name] requesting assistance with calling 911 for patient due to patient is reporting severe depression with SI and chest pain Determined patient's location is at the [facility] in [place] near [place] outbound call to 911 requesting EMS be dispatched to patient's current location for evaluation of depression with SI and chest pain Determined patient was transported to [facility] for evaluation, unfortunately patient left the facility [place] Activities: - Keep PCP follow up appointment as directed  Patient Goals: -use positive distractions to help with effective coping when feeling down, sad or hopeless -notify your healthcare team promptly for worsening depression and or suicidal ideation occurs  -use your Mood and Health Zone Management Tool to help you determined when to call the doctor (will be mailed to your home address)  Follow Up Plan: Telephone follow up appointment with care  management team member scheduled for: 01/15/21     Plan:Telephone follow up appointment with care management team member scheduled for:  01/15/21  Barb Merino, RN, BSN, CCM Care Management Coordinator [place] [facility]  Direct Phone: [phone]

## 2021-01-15 ENCOUNTER — Telehealth: Payer: Medicare Other

## 2021-01-15 ENCOUNTER — Telehealth: Payer: Self-pay

## 2021-01-15 DIAGNOSIS — N1831 Chronic kidney disease, stage 3a: Secondary | ICD-10-CM

## 2021-01-15 DIAGNOSIS — F331 Major depressive disorder, recurrent, moderate: Secondary | ICD-10-CM | POA: Diagnosis not present

## 2021-01-15 DIAGNOSIS — E785 Hyperlipidemia, unspecified: Secondary | ICD-10-CM | POA: Diagnosis not present

## 2021-01-15 DIAGNOSIS — I10 Essential (primary) hypertension: Secondary | ICD-10-CM

## 2021-01-15 DIAGNOSIS — E1169 Type 2 diabetes mellitus with other specified complication: Secondary | ICD-10-CM | POA: Diagnosis not present

## 2021-01-15 DIAGNOSIS — C61 Malignant neoplasm of prostate: Secondary | ICD-10-CM

## 2021-01-15 DIAGNOSIS — E1121 Type 2 diabetes mellitus with diabetic nephropathy: Secondary | ICD-10-CM

## 2021-01-15 NOTE — Telephone Encounter (Signed)
  Care Management   Follow Up Note   01/15/2021 Name: [name] MRN: [id number] DOB: 1955/07/07   Referred by: [name], [name] Reason for referral : Chronic Care Management (RN CM follow up call )   An unsuccessful telephone outreach was attempted today. The patient was referred to the case management team for assistance with care management and care coordination. I spoke with [name] briefly today, unfortunately he is not available to speak with me but is agreeable to a call back on Thursday or Friday this week.   Follow Up Plan: Telephone follow up appointment with care management team member scheduled for: 01/18/21  Barb Merino, RN, BSN, CCM Care Management Coordinator [place] [facility]  Direct Phone: [phone]

## 2021-01-15 NOTE — Telephone Encounter (Signed)
Called to check on pt after visiting the ED on 01/11/2021. Pt states he is doing okay as of now. He asked to reschedule his appointment he had for 01/17/2021 to mid November. Appointment was rescheduled. Patient also notified if any questions or concerns feel free to give the office a call.

## 2021-01-16 ENCOUNTER — Telehealth: Payer: Medicare Other

## 2021-01-16 ENCOUNTER — Ambulatory Visit: Payer: Medicare Other | Admitting: Urology

## 2021-01-17 ENCOUNTER — Ambulatory Visit: Payer: Medicare Other | Admitting: Nurse Practitioner

## 2021-01-18 ENCOUNTER — Telehealth: Payer: Self-pay

## 2021-01-18 ENCOUNTER — Telehealth: Payer: Medicare Other

## 2021-01-18 NOTE — Telephone Encounter (Signed)
  Care Management   Follow Up Note   01/18/2021 Name: [name] MRN: [id number] DOB: 1955/09/11   Referred by: [name], [name] Reason for referral : Chronic Care Management (RN CM Follow up call - 2nd attempt )   A second unsuccessful telephone outreach was attempted today. The patient was referred to the case management team for assistance with care management and care coordination. I spoke with [name] briefly today, unfortunately he states he is not available to speak with me but is agreeable to call back on Monday.   Follow Up Plan: Telephone follow up appointment with care management team member scheduled for: 01/23/21  Barb Merino, RN, BSN, CCM Care Management Coordinator [place] [facility]  Direct Phone: [phone]

## 2021-01-23 ENCOUNTER — Telehealth: Payer: Medicare Other

## 2021-01-23 ENCOUNTER — Telehealth: Payer: Self-pay

## 2021-01-23 NOTE — Telephone Encounter (Signed)
  Care Management   Follow Up Note   01/23/2021 Name: [name] MRN: [id number] DOB: [birth year]   Referred by: [name], [name] Reason for referral : Chronic Care Management (RN CM Follow up call - 3rd attempt )   Third unsuccessful telephone outreach was attempted today. The patient was referred to the case management team for assistance with care management and care coordination. The patient's primary care provider has been notified of our unsuccessful attempts to make or maintain contact with the patient. The care management team is pleased to engage with this patient at any time in the future should he/she be interested in assistance from the care management team.   Follow Up Plan: Telephone follow up appointment with care management team member scheduled for: 01/30/21  Barb Merino, RN, BSN, CCM Care Management Coordinator [place] [facility]  Direct Phone: [phone]

## 2021-01-24 NOTE — Telephone Encounter (Signed)
Error

## 2021-01-29 ENCOUNTER — Ambulatory Visit: Payer: Medicare Other | Admitting: Nurse Practitioner

## 2021-01-30 ENCOUNTER — Ambulatory Visit (INDEPENDENT_AMBULATORY_CARE_PROVIDER_SITE_OTHER): Payer: Medicare Other

## 2021-01-30 ENCOUNTER — Telehealth: Payer: Medicare Other

## 2021-01-30 DIAGNOSIS — I10 Essential (primary) hypertension: Secondary | ICD-10-CM

## 2021-01-30 DIAGNOSIS — N1831 Chronic kidney disease, stage 3a: Secondary | ICD-10-CM

## 2021-01-30 DIAGNOSIS — E1121 Type 2 diabetes mellitus with diabetic nephropathy: Secondary | ICD-10-CM

## 2021-01-30 NOTE — Chronic Care Management (AMB) (Signed)
Chronic Care Management    Social Work Note - Case Closure  01/30/2021 Name: [name] MRN: [id number] DOB: January 03, 1956  [name] is a 65 y.o. year old male who is a primary care patient of [name], [place]. The CCM team was consulted to assist the patient with chronic disease management and/or care coordination needs related to:  CKD III, DM II, HTN .    Fourth unsuccessful call placed to the patient today   for follow up visit in response to provider referral for social work chronic care management and care coordination services. Collaboration with patients primary care team including Randy Brine [name], [name], and [name] PharmD to discuss inability to maintain contact with the patient including multiple no shows for appointments as well as failure to return calls. SW will perform a case closure.  Consent to Services:  The patient was given information about Chronic Care Management services, agreed to services, and gave verbal consent prior to initiation of services.  Please see initial visit note for detailed documentation.   Patient agreed to services and consent obtained.   Assessment: Review of patient past medical history, allergies, medications, and health status, including review of relevant consultants reports was performed today as part of a comprehensive evaluation and provision of chronic care management and care coordination services.     SDOH (Social Determinants of Health) assessments and interventions performed:    Advanced Directives Status: Not addressed in this encounter.  CCM Care Plan  Allergies  Allergen Reactions   Haldol [Haloperidol]     Per family cause life threatening complications to patient    Outpatient Encounter Medications as of 01/30/2021  Medication Sig   acetaminophen (TYLENOL) 325 MG tablet Take 2 tablets (650 mg total) by mouth every 6 (six) hours as needed for mild pain (or Fever >/= 101).   alfuzosin  (UROXATRAL) 10 MG 24 hr tablet Take 1 tablet (10 mg total) by mouth daily with breakfast.   amLODipine (NORVASC) 5 MG tablet Take 1 tablet (5 mg total) by mouth daily.   ARIPiprazole (ABILIFY) 2 MG tablet TAKE 1 TABLET BY MOUTH EVERY DAY (Patient taking differently: Take 2 mg by mouth daily.)   aspirin EC 81 MG tablet Take 81 mg by mouth daily with breakfast.    atorvastatin (LIPITOR) 40 MG tablet 1 tablet by mouth Monday - Friday (Patient taking differently: Take 40 mg by mouth daily.)   clotrimazole-betamethasone (LOTRISONE) cream Apply 1 application topically 2 (two) times daily.   diclofenac Sodium (VOLTAREN) 1 % GEL Apply 4 g topically 4 (four) times daily.   empagliflozin (JARDIANCE) 10 MG TABS tablet Take 10 mg by mouth daily.   finasteride (PROSCAR) 5 MG tablet Take 1 tablet (5 mg total) by mouth daily.   metoprolol tartrate (LOPRESSOR) 50 MG tablet Take 1 tablet (50 mg total) by mouth 2 (two) times daily. (Patient not taking: No sig reported)   Multiple Vitamin (MULTIVITAMIN WITH MINERALS) TABS tablet Take 1 tablet by mouth daily.   nystatin (NYSTATIN) powder Apply 1 application topically 3 (three) times daily.   pantoprazole (PROTONIX) 40 MG tablet Take 1 tablet (40 mg total) by mouth 2 (two) times daily.   Semaglutide (RYBELSUS) 14 MG TABS Take 1 tablet by mouth daily. Take 30 minutes before breakfast   Tetrahydrozoline HCl (VISINE OP) Place 1 drop into both eyes daily as needed (itching/irritation).   traZODone (DESYREL) 50 MG tablet TAKE 1 TABLET (50 MG TOTAL) BY  MOUTH AT BEDTIME AS NEEDED FOR SLEEP.   No facility-administered encounter medications on file as of 01/30/2021.    Patient Active Problem List   Diagnosis Date Noted   Type 2 diabetes mellitus ([place]) 01/05/2021   HLD (hyperlipidemia) 01/05/2021   Fall 11/11/2020   Hypertensive urgency, malignant 10/25/2020   Acute urinary retention 10/25/2020   Adjustment disorder with mixed anxiety and depressed mood    Acute renal  failure superimposed on stage 3a chronic kidney disease ([place]) 09/15/2020   Ataxia 08/01/2020   Dizziness 07/31/2020   Chest pain 07/31/2020   Hip pain 07/31/2020   AKI (acute kidney injury) ([place]) 02/11/2020   Hydroureteronephrosis 02/10/2020   Nausea and vomiting 02/10/2020   History of COVID-19 02/10/2020   Pneumonia due to COVID-19 virus 09/29/2019   Hyponatremia 09/28/2019   Sinus tachycardia 09/28/2019   Insomnia 08/26/2018   Nephropathy 03/26/2018   Urinary urgency 03/26/2018   Type 2 diabetes mellitus with diabetic nephropathy, without long-term current use of insulin ([place]) 03/26/2018   Essential hypertension 03/26/2018   Chronic kidney disease (CKD), stage III (moderate) ([place]) 03/26/2018   Conductive hearing loss, bilateral 11/07/2017   Uncontrolled type 2 diabetes mellitus with hyperglycemia ([place]) 07/31/2016   Dyslipidemia associated with type 2 diabetes mellitus ([place]) 05/29/2016   Microcytic anemia 02/17/2012   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 SW is closing goals due to an inability for patient to progress.  Conditions to be addressed/monitored: HTN, DMII, and CKD Stage III ; Family and relationship dysfunction  Care Plan : Chronic Kidney (Adult)  Updates made by [name] since 01/30/2021 12:00 AM  Completed 01/30/2021   Problem: Disease Progression Resolved 01/30/2021  Priority: Medium     Long-Range Goal: Disease Progression Prevented or Minimized Completed 01/30/2021  Start Date: 04/06/2020  Expected End Date: 04/06/2021  Recent Progress: On track  Priority: Medium  Note:   Objective:  Lab Results  Component Value Date   HGBA1C 8.4 (H) 12/06/2020   Lab Results  Component Value Date   CREATININE 1.67 (H) 12/06/2020   CREATININE 1.82 (H) 11/11/2020   CREATININE 2.10 (H) 10/30/2020   Lab Results  Component Value Date   EGFR 45 (L) 12/06/2020   Current Barriers:  Ineffective Self Health Maintenance Currently UNABLE TO independently self manage needs related to chronic health conditions.  Knowledge Deficits related to short term plan for care coordination needs and long term plans for chronic disease management needs Does not adhere to prescribed treatment plan  Does not take medications as prescribed Does not keep MD doctor appointments as directed Does not follow dietary recommendations  Does not call physician for concerns  Case Manager Clinical Goal(s):  Collaboration with [name], [place] regarding development and update of comprehensive plan of care as evidenced by provider attestation and co-signature Inter-disciplinary care team collaboration (see longitudinal plan of care) Patient will work with care management team to address care coordination and chronic disease management needs related to Disease Management Educational Needs Care Coordination Medication Management and Education Psychosocial Support   Interventions:  12/18/20 completed successful outbound call with patient  Collaboration with Randy Brine [name] regarding development and update of comprehensive plan of care as evidenced by provider attestation and co-signature Inter-disciplinary care team collaboration (see longitudinal plan of care) Provided education to patient about basic disease process related to Chronic Kidney disease Review of patient status, including review of consultant's  reports, relevant laboratory and other test results, and medications completed. Reviewed medications with patient and discussed importance of medication adherence Educated on importance of increasing water to 64 oz daily; Educated on the importance to keep BP under good control, discussed target BP <130/80; Educated on the importance to keep DM under good control  Discussed plans with patient for ongoing care management follow up and provided patient with direct contact  information for care management team Patient Goals:  -Increase daily water intake to 64 oz  -Call Nephrologist to schedule a follow up appointment                      Follow Up Plan: Telephone follow up appointment with care management team member scheduled for: 01/15/21       Care Plan : Diabetes Type 2 (Adult)  Updates made by [name] since 01/30/2021 12:00 AM  Completed 01/30/2021   Problem: Disease Progression (Diabetes, Type 2) Resolved 01/30/2021  Priority: Medium     Long-Range Goal: Disease Progression Prevented or Minimized Completed 01/30/2021  Start Date: 04/06/2020  Expected End Date: 04/06/2021  Recent Progress: Not on track  Priority: Medium  Note:   Objective:  Lab Results  Component Value Date   HGBA1C 8.4 (H) 12/06/2020   Lab Results  Component Value Date   CREATININE 1.67 (H) 12/06/2020   CREATININE 1.82 (H) 11/11/2020   CREATININE 2.10 (H) 10/30/2020   Lab Results  Component Value Date   EGFR 45 (L) 12/06/2020   Current Barriers:  Knowledge Deficits related to basic Diabetes pathophysiology and self care/management Knowledge Deficits related to medications used for management of diabetes Literacy barriers Does not use cbg meter  Limited Social Support Unable to self administer medications as prescribed Does not attend all scheduled provider appointments Does not adhere to prescribed medication regimen Does not adhere to prescribed psychotropic medication regimen Does not contact provider office for questions/concerns Case Manager Clinical Goal(s):  patient will demonstrate improved adherence to prescribed treatment plan for diabetes self care/management as evidenced by: daily monitoring and recording of CBG  adherence to ADA/ carb modified diet exercise 5 days/week adherence to prescribed medication regimen contacting provider for new or worsened symptoms or questions Interventions:  12/18/20 completed successful outbound call with patient   Collaboration with [name], [place] regarding development and update of comprehensive plan of care as evidenced by provider attestation and co-signature Inter-disciplinary care team collaboration (see longitudinal plan of care) Provided education to patient about basic DM disease process Review of patient status, including review of consultant's reports, relevant laboratory and other test results, and medications completed. Reviewed medications with patient and discussed importance of medication adherence Educated patient on dietary and exercise recommendations; daily glycemic control FBS 80-130, <180 after meals;15'15' rule Advised patient, providing education and rationale, to check cbg daily before meals and at bedtime and record, calling the CCM team and or PCP for findings outside established parameters Discussed plans with patient for ongoing care management follow up and provided patient with direct contact information for care management team Self-Care Activities - Does not adhere to ADA/carb modified diet  Patient Goals: - check blood sugar at prescribed times - check blood sugar if I feel it is too high or too low - take the blood sugar log to all doctor visits - drink 6 to 8 glasses of water each day - fill half of plate with vegetables - manage portion size - keep appointment with eye doctor - schedule  appointment with eye doctor - check feet daily for cuts, sores or redness - do heel pump exercise 2 to 3 times each day - keep feet up while sitting - trim toenails straight across - wash and dry feet carefully every day - wear comfortable, cotton socks - wear comfortable, well-fitting shoes  Follow Up Plan: Telephone follow up appointment with care management team member scheduled for: 01/15/21     Care Plan : Hypertension (Adult)  Updates made by [name] since 01/30/2021 12:00 AM  Completed 01/30/2021   Problem: Disease Progression (Hypertension) Resolved  01/30/2021  Priority: Medium     Long-Range Goal: Disease Progression Prevented or Minimized Completed 01/30/2021  Start Date: 04/06/2020  Expected End Date: 04/06/2021  Recent Progress: On track  Priority: Medium  Note:   Objective:  Last practice recorded BP readings:  BP Readings from Last 3 Encounters:  12/06/20 114/68  11/15/20 (!) 148/90  11/11/20 (!) 152/95   Most recent eGFR/CrCl:  Lab Results  Component Value Date   EGFR 45 (L) 12/06/2020    No components found for: CRCL Current Barriers:  Knowledge Deficits related to basic understanding of hypertension pathophysiology and self care management Knowledge Deficits related to understanding of medications prescribed for management of hypertension Does not adhere to provider recommendations re:  Does not attend all scheduled provider appointments Does not adhere to prescribed medication regimen Does not adhere to prescribed psychotropic medication regimen Does not contact provider office for questions/concerns Case Manager Clinical Goal(s):  patient will verbalize understanding of plan for hypertension management patient will demonstrate improved adherence to prescribed treatment plan for hypertension as evidenced by taking all medications as prescribed, monitoring and recording blood pressure as directed, adhering to low sodium/DASH diet Interventions:  12/18/20 completed unsuccessful outbound call with patient  Collaboration with [name], [place] regarding development and update of comprehensive plan of care as evidenced by provider attestation and co-signature Inter-disciplinary care team collaboration (see longitudinal plan of care) Evaluation of current treatment plan related to hypertension self management and patient's adherence to plan as established by provider. Provided education to patient re: stroke prevention, s/s of heart attack and stroke, DASH diet, complications of uncontrolled blood pressure Reviewed  medications with patient and discussed importance of compliance Discussed plans with patient for ongoing care management follow up and provided patient with direct contact information for care management team Self-Care Activities: Self administers medications as prescribed Attends all scheduled provider appointments Calls provider office for new concerns, questions, or BP outside discussed parameters Checks BP and records as discussed Follows a low sodium diet/DASH diet Patient Goals: - learn about high blood pressure  Follow Up Plan: Telephone follow up appointment with care management team member scheduled for: 01/15/21    Care Plan : [place]  Updates made by [name] since 01/30/2021 12:00 AM  Completed 01/30/2021   Problem: HTN, HLD, DM II Resolved 01/30/2021  Priority: High     Long-Range Goal: Disease Management Completed 01/30/2021  Recent Progress: On track  Priority: High  Note:    Current Barriers:  Unable to achieve control of Diabetes    Pharmacist Clinical Goal(s):  Patient will achieve adherence to monitoring guidelines and medication adherence to achieve therapeutic efficacy through collaboration with PharmD and provider.   Interventions: 1:1 collaboration with Randy Brine, [name] regarding development and update of comprehensive plan of care as evidenced by provider attestation and co-signature Inter-disciplinary care team collaboration (see longitudinal plan of care) Comprehensive medication review performed;  medication list updated in electronic medical record  Hypertension (BP goal <130/80) -Uncontrolled -Current treatment: Metoprolol Tartrate 50 mg tablet once a day  Olmesartan - Hydrochlorothiazide 40-25 mg tablet once per day  Patient ran out of medication  Filled BP medication about a week ago.  -Current home readings: patient reports his BP is usually <130/80 -Current dietary habits: Patient is not eating as much fast food, he  is buying fresh vegetables, steak, fish or chicken breast  -Current exercise habits:walks about a mile every day  -Denies hypotensive/hypertensive symptoms -Educated on Proper BP monitoring technique; -Counseled to monitor BP at home at least once per day, document, and provide log at future appointments -Counseled on diet and exercise extensively Recommended to continue current medication  Hyperlipidemia: (LDL goal < 70) -Uncontrolled -Current treatment: Atorvastatin 20 mg tablet once per day Patient reports that he does not take every day, he is going to try to take his medicine every day, he has 45 pills left.  -Current exercise habits: walks about a mile every day  -Educated on Cholesterol goals;  Benefits of statin for ASCVD risk reduction; Importance of limiting foods high in cholesterol; Exercise goal of 150 minutes per week; -Recommended to continue current medication Recommended patient place a reminder system for him to take his medication.   Diabetes (A1c goal <7%) -Uncontrolled -Current medications: Rybelsus 14 mg  Taking 1 tablet by mouth daily Tradjenta 5 mg Taking 1 tablet by mouth daily  -Current home glucose readings post prandial glucose:110-140  -Denies hypoglycemic/hyperglycemic symptoms -Current meal patterns:  breakfast: bowl of cornflakes, cheerios or an egg sandwich with a bowl of grits  lunch: Mcchicken sandwich with a bit of lettuce  dinner: sometimes steak,chicken breast, slaw/salad or a piece of fish, sometimes a protein shake  snacks: patient reports that he is eating more healthy drinks: water, and in the evening a protein shake, unsweet orange, grapefruit juice, unsweet tea -Current exercise: does a lot of walking around  -Educated on A1c and blood sugar goals; Complications of diabetes including kidney damage, retinal damage, and cardiovascular disease; Exercise goal of 150 minutes per week; Prevention and management of hypoglycemic  episodes; Benefits of routine self-monitoring of blood sugar; Carbohydrate counting and/or plate method -Counseled to check feet daily and get yearly eye exams -Counseled on diet and exercise extensively Recommended to continue current medication  Health Maintenance -Vaccine gaps: COVID -19 ( patient is not fully vaccinated), Pneumovax, Prevnar   -Educated on the importance of vaccines. -Patient is satisfied with current therapy and denies issues -Recommended patient have vaccinations completed.    Patient Goals/Self-Care Activities Patient will:  - focus on medication adherence by taking it at the same time each day.   Follow Up Plan: Telephone follow up appointment with care management team member scheduled for: The patient has been provided with contact information for the care management team and has been advised to call with any health related questions or concerns.     Care Plan : Social Work [place]  Updates made by [name] since 01/30/2021 12:00 AM  Completed 01/30/2021   Problem: Quality of Life (General Plan of Care) Resolved 01/30/2021     Long-Range Goal: Quality of Life Maintained Completed 01/30/2021  Start Date: 08/09/2020  Recent Progress: On track  Priority: Medium  Note:   Current Barriers:  Chronic disease management support and education needs related to HTN, DM, and CKD Stage III   Limited access to caregiver Recent inpatient admission  Social Worker Clinical Goal(s):  Patient will work with SW to identify and address any acute and/or chronic care coordination needs related to the self health management of HTN, DM, and CKD Stage III   Patient will work with SW to become more knowledgeable of health plan benefits   SW Interventions:  Inter-disciplinary care team collaboration (see longitudinal plan of care) Collaboration with [name], [place] regarding development and update of comprehensive plan of care as evidenced by provider  attestation and co-signature Successful outbound call placed to the patient to assess care coordination needs Patient reports he is still feeling depressed (see alternate goal entry related to this complaint) Discussed the patient intends to go through treatment for prostate cancer Performed chart review to note patient seen by Urologist on 9/27 and an MRI was ordered - patient has yet to complete this imaging Assessed for patient plans to complete imaging - patient states "I need to follow up with them, what is today's date?" SW provided today's date to the patient, patient changed the subject to inform SW he is shopping for his lady friends daughter who has an upcoming birthday. Patient states he hopes to speak with his lady friend when she gets off work today Unable to refocus patient to plan to complete MRI Collaboration with [place] to inform of patients plan to undergo treatment for prostate cancer  Patient Goals/Self-Care Activities patient will:  -Follow up with Urology   -  Contact his provider to schedule an appointment as needed -Contact SW as needed prior to next scheduled call  Follow Up Plan:  SW will follow up with the patient over the next month    Problem: Home and Family Safety (Wellness) Resolved 01/30/2021     Goal: Patient Safety Concerns Completed 01/30/2021  Start Date: 10/20/2020  Expected End Date: 11/19/2020  Recent Progress: Not on track  Priority: High  Note:   Current Barriers:  Chronic disease management support and education needs related to HTN, DM, and CKD Stage III   8 ED visits during the month of July  Majority of which he left AMA - One ED visit he expressed depression due to fight with his girlfriend stating "he cannot be happy without her" per provider documentation the patient was screened and provided with outpatient [place] resources Unable to contact the patient - contact number is no longer working Patient was a no call no show  for office visit with primary provider on 8.4.22  Social Worker Clinical Goal(s):  SW will contact [facility] Police department to request a welfare check based on above barriers SW will mail patient a letter requesting he contact a member of the care management team SW will collaborate with patients primary care team as needed to discuss concerns for patient safety 01/11/21- Patient will seek care in the emergency department as directed by SW due to chest pain and SI  SW Interventions:  Inter-disciplinary care team collaboration (see longitudinal plan of care) Collaboration with [name], [place] regarding development and update of comprehensive plan of care as evidenced by provider attestation and co-signature Unsuccessful call placed to the patient without option to leave a voice message Collaboration with primary care team to discuss plan to close patient goals due to inability to keep patient engaged  Patient Goals/Self-Care Activities patient will:  -Seek care in the ED for self reported chest pain and depression with SI  Follow Up Plan:  SW will continue to follow     Care  Plan : Depression (Adult)  Updates made by [name] since 01/30/2021 12:00 AM  Completed 01/30/2021   Problem: Harm or Injury (Depression) Resolved 01/30/2021  Priority: High     Long-Range Goal: Harm or Injury Prevented Completed 01/30/2021  Start Date: 10/31/2020  Expected End Date: 05/03/2021  Recent Progress: On track  Priority: High  Note:   Current Barriers:  Ineffective Self Health Maintenance in a patient with CKD III, DM II, HTN, HLD Does not adhere to provider recommendations re: Psychiatric follow up for depression  Does not attend all scheduled provider appointments Does not adhere to prescribed medication regimen Does not adhere to prescribed psychotropic medication regimen Lacks social connections Does not maintain contact with provider office Does not contact provider office  for questions/concerns Clinical Goal(s):  Collaboration with [name], [place] regarding development and update of comprehensive plan of care as evidenced by provider attestation and co-signature Inter-disciplinary care team collaboration (see longitudinal plan of care) patient will work with care management team to address care coordination and chronic disease management needs related to Disease Management Educational Needs Care Coordination Medication Management and Education Medication Reconciliation Psychosocial Support   Interventions:  01/11/21 Case Collaboration  Evaluation of current treatment plan related to CKD III, DM II, HTN, HLD, self-management and patient's adherence to plan as established by provider. Collaboration with Randy Brine, [name] regarding development and update of comprehensive plan of care as evidenced by provider attestation       and co-signature Inter-disciplinary care team collaboration (see longitudinal plan of care) Received urgent message from embedded BSW [name] requesting assistance with calling 911 for patient due to patient is reporting severe depression with SI and chest pain Determined patient's location is at the [facility] in [place] near [place] outbound call to 911 requesting EMS be dispatched to patient's current location for evaluation of depression with SI and chest pain Determined patient was transported to [facility] for evaluation, unfortunately patient left the facility [place] Activities: - Keep PCP follow up appointment as directed  Patient Goals: -use positive distractions to help with effective coping when feeling down, sad or hopeless -notify your healthcare team promptly for worsening depression and or suicidal ideation occurs  -use your Mood and Health Zone Management Tool to help you determined when to call the doctor (will be mailed to your home address)  Follow Up Plan: Telephone follow up  appointment with care management team member scheduled for: 01/15/21     Care Plan : Malignant neoplasm of prostate  Updates made by [name] since 01/30/2021 12:00 AM  Completed 01/30/2021   Problem: Malignant neoplasm of prostate Resolved 01/30/2021  Priority: High     Long-Range Goal: Malignant neoplasm of prostate complications prevented or minimized Completed 01/30/2021  Start Date: 12/05/2020  Expected End Date: 12/05/2021  Recent Progress: On track  Priority: High  Note:   Current Barriers:  Ineffective Self Health Maintenance in a patient with CKD III, DM II, HTN, HLD, Depressive disorder, Prostate Cancer   Does not attend all scheduled provider appointments Does not adhere to prescribed medication regimen Does not adhere to prescribed psychotropic medication regimen Lacks social connections Does not contact provider office for questions/concerns Clinical Goal(s):  Collaboration with [name], [place] regarding development and update of comprehensive plan of care as evidenced by provider attestation and co-signature Inter-disciplinary care team collaboration (see longitudinal plan of care) patient will work with care management team to address care coordination and chronic  disease management needs related to Disease Management Educational Needs Care Coordination Medication Management and Education Medication Reconciliation Psychosocial Support   Interventions:  12/18/20 completed successful outbound call with patient  Evaluation of current treatment plan related to  Malignant neoplasm of prostate , self-management and patient's adherence to plan as established by provider. Collaboration with Randy Brine, [name] regarding development and update of comprehensive plan of care as evidenced by provider attestation       and co-signature Inter-disciplinary care team collaboration (see longitudinal plan of care) Determined patient completed a follow up with Dr. Erlene Quan,  Urologist on 12/12/20 with the following Assessment/Plan reviewed:  Assessment/ Plan: 1.  Prostate cancer  - PSA is rising agree with prostate MRI as recommended by his [place]. He does state he would like to continue pursuing care in [place]  -  prostate MRI; scheduled  2. BPH with outlet obstruction - Consistent with chronic outlet obstruction  - start on finasteride 5 mg to regimen  -Continue alpha-blocker -We will discuss option for outlet procedure pending MRI results/treatment plan for #1 3. Erectile dysfunction  - sildenafil with maximum therapy with little effect  - counseled him on taking this with a full stomach  - will consider injection in the future is symptoms persist  Follow-up after prostate MRI Determined patient verbalizes understanding of his prescribed treatment plan Discussed plans with patient for ongoing care management follow up and provided patient with direct contact information for care management team Self Care Activities:  Patient verbalizes understanding of plan to follow up with Dr. Hollice Espy MD at [facility] on 12/12/20 _0 :00 AM  Patient Goals: - keep prostate MRI appointment as directed - follow up with Dr. Erlene Quan after completion of prostate MRI as directed   Follow Up Plan: Telephone follow up appointment with care management team member scheduled for: 01/16/21       Follow Up Plan:  No follow up planned at this time due to inability to maintain patient contact.      [name], BSW, CDP Social Worker, Certified Dementia Practitioner [place] [facility] [place] [facility] [phone]

## 2021-01-30 NOTE — Patient Instructions (Signed)
Social Worker Visit Information  Goals we discussed today:   Goals Addressed             This Visit's Progress    COMPLETED: Harm or injury prevented       Timeframe:  Short-Term Goal Priority:  High Start Date:  10/31/20                           Expected End Date:  05/03/21       Follow up date: 01/15/21      Patient Goals: -use positive distractions to help with effective coping when feeling down, sad or hopeless -notify your healthcare team promptly for worsening depression and or suicidal ideation occurs  -use your Mood and Health Zone Management Tool to help you determined when to call the doctor (will be mailed to your home address)            COMPLETED: Malignant neoplasm of prostate complications prevented or minimized       Timeframe:  Long-Range Goal Priority:  High Start Date:  12/05/20                           Expected End Date: 12/05/21     Follow up date: 01/15/21         Patient Goals: - keep prostate MRI appointment as directed - follow up with Dr. Erlene Quan after completion of prostate MRI as directed             COMPLETED: Manage My Medicine       Timeframe:  Long-Range Goal Priority:  High Start Date:                             Expected End Date:                       Follow Up Date 09/05/2020   - call for medicine refill 2 or 3 days before it runs out - call if I am sick and can't take my medicine - keep a list of all the medicines I take; vitamins and herbals too - use a pillbox to sort medicine - use an alarm clock or phone to remind me to take my medicine    Why is this important?   These steps will help you keep on track with your medicines.        COMPLETED: Monitor and Manage My Blood Sugar-Diabetes Type 2       Timeframe:  Long-Range Goal Priority:  Medium Start Date:  04/06/20                          Expected End Date:  04/06/21                      Follow Up Date: 01/15/21  - check blood sugar at prescribed times - check blood  sugar if I feel it is too high or too low - take the blood sugar log to all doctor visits - drink 6 to 8 glasses of water each day - fill half of plate with vegetables - manage portion size - keep appointment with eye doctor - schedule appointment with eye doctor - check feet daily for cuts, sores or redness - do heel pump exercise 2 to 3 times each  day - keep feet up while sitting - trim toenails straight across - wash and dry feet carefully every day - wear comfortable, cotton socks - wear comfortable, well-fitting shoes   Why is this important?   Checking your blood sugar at home helps to keep it from getting very high or very low.  Writing the results in a diary or log helps the doctor know how to care for you.  Your blood sugar log should have the time, date and the results.  Also, write down the amount of insulin or other medicine that you take.  Other information, like what you ate, exercise done and how you were feeling, will also be helpful.     Notes:      COMPLETED: Patient Safety Concerns       Timeframe:  Short-Term Goal Priority:  High Start Date:  8.5.22                                       Patient Goals/Self-Care Activities patient will:  -Seek care in the ED for self reported chest pain and depression with SI      COMPLETED: Prevent disease progression related to chronic kidney disease       Timeframe:  Long-Range Goal Priority:  Medium Start Date: 04/06/20                           Expected End Date:  04/06/21  Follow Up Date:  01/15/21  Patient Goals:  -Increase daily water intake to 64 oz  -Call Nephrologist to schedule a follow up appointment                           COMPLETED: Quality of Life Maintained       Timeframe:  Long-Range Goal Priority:  Medium Start Date:   5.25.22                                          Next planned outreach: 11.15.22  Patient Goals/Self-Care Activities patient will:  -Follow up with Urology   - Contact his  provider to schedule an appointment as needed -Contact SW as needed prior to next scheduled call     COMPLETED: Track and Manage My Blood Pressure-Hypertension       Timeframe:  Long-Range Goal Priority:  Medium Start Date: 04/06/20                           Expected End Date: 04/06/21                   Follow Up Date: 01/15/21   write blood pressure results in a log or diary  Self administers medications as prescribed Attends all scheduled provider appointments Calls provider office for new concerns, questions, or BP outside discussed parameters Checks BP and records as discussed Follows a low sodium diet/DASH diet   Why is this important?   You won't feel high blood pressure, but it can still hurt your blood vessels.  High blood pressure can cause heart or kidney problems. It can also cause a stroke.  Making lifestyle changes like losing a little weight or eating less salt will help.  Checking your blood pressure at home and at different times of the day can help to control blood pressure.  If the doctor prescribes medicine remember to take it the way the doctor ordered.  Call the office if you cannot afford the medicine or if there are questions about it.     Notes:          Materials Provided: No. Patient not reached.   Follow Up Plan:  No follow up planned at this time due to an inability to maintain patient contact.   [name], BSW, CDP Social Worker, Certified Dementia Practitioner [place] [facility] [place] [facility] [phone]

## 2021-01-31 ENCOUNTER — Emergency Department (HOSPITAL_COMMUNITY)
Admission: EM | Admit: 2021-01-31 | Discharge: 2021-01-31 | Discharge status: Against Medical Advice | Payer: Medicare Other | Attending: Emergency Medicine | Admitting: Emergency Medicine

## 2021-01-31 ENCOUNTER — Encounter (HOSPITAL_COMMUNITY): Payer: Self-pay

## 2021-01-31 DIAGNOSIS — Z79899 Other long term (current) drug therapy: Secondary | ICD-10-CM | POA: Insufficient documentation

## 2021-01-31 DIAGNOSIS — F32A Depression, unspecified: Secondary | ICD-10-CM

## 2021-01-31 DIAGNOSIS — N1831 Chronic kidney disease, stage 3a: Secondary | ICD-10-CM | POA: Diagnosis not present

## 2021-01-31 DIAGNOSIS — R079 Chest pain, unspecified: Secondary | ICD-10-CM | POA: Diagnosis not present

## 2021-01-31 DIAGNOSIS — R000 Tachycardia, unspecified: Secondary | ICD-10-CM | POA: Diagnosis not present

## 2021-01-31 DIAGNOSIS — Z8616 Personal history of COVID-19: Secondary | ICD-10-CM | POA: Insufficient documentation

## 2021-01-31 DIAGNOSIS — R112 Nausea with vomiting, unspecified: Secondary | ICD-10-CM | POA: Insufficient documentation

## 2021-01-31 DIAGNOSIS — E1140 Type 2 diabetes mellitus with diabetic neuropathy, unspecified: Secondary | ICD-10-CM | POA: Diagnosis not present

## 2021-01-31 DIAGNOSIS — F329 Major depressive disorder, single episode, unspecified: Secondary | ICD-10-CM | POA: Diagnosis present

## 2021-01-31 DIAGNOSIS — Z7984 Long term (current) use of oral hypoglycemic drugs: Secondary | ICD-10-CM | POA: Insufficient documentation

## 2021-01-31 DIAGNOSIS — Z8546 Personal history of malignant neoplasm of prostate: Secondary | ICD-10-CM | POA: Diagnosis not present

## 2021-01-31 DIAGNOSIS — R739 Hyperglycemia, unspecified: Secondary | ICD-10-CM | POA: Diagnosis not present

## 2021-01-31 DIAGNOSIS — E1122 Type 2 diabetes mellitus with diabetic chronic kidney disease: Secondary | ICD-10-CM | POA: Insufficient documentation

## 2021-01-31 DIAGNOSIS — I129 Hypertensive chronic kidney disease with stage 1 through stage 4 chronic kidney disease, or unspecified chronic kidney disease: Secondary | ICD-10-CM | POA: Diagnosis not present

## 2021-01-31 DIAGNOSIS — R6889 Other general symptoms and signs: Secondary | ICD-10-CM | POA: Diagnosis not present

## 2021-01-31 DIAGNOSIS — R0602 Shortness of breath: Secondary | ICD-10-CM | POA: Diagnosis not present

## 2021-01-31 DIAGNOSIS — Z743 Need for continuous supervision: Secondary | ICD-10-CM | POA: Diagnosis not present

## 2021-01-31 DIAGNOSIS — Z7982 Long term (current) use of aspirin: Secondary | ICD-10-CM | POA: Insufficient documentation

## 2021-01-31 LAB — BASIC METABOLIC PANEL
Anion gap: 11 (ref 5–15)
BUN: 35 mg/dL — ABNORMAL HIGH (ref 8–23)
CO2: 25 mmol/L (ref 22–32)
Calcium: 9.8 mg/dL (ref 8.9–10.3)
Chloride: 100 mmol/L (ref 98–111)
Creatinine, Ser: 1.71 mg/dL — ABNORMAL HIGH (ref 0.61–1.24)
GFR, Estimated: 43 mL/min — ABNORMAL LOW (ref >60)
Glucose, Bld: 295 mg/dL — ABNORMAL HIGH (ref 70–99)
Potassium: 4.2 mmol/L (ref 3.5–5.1)
Sodium: 136 mmol/L (ref 135–145)

## 2021-01-31 LAB — CBC WITH DIFFERENTIAL/PLATELET
Abs Immature Granulocytes: 0.04 10*3/uL (ref 0.00–0.07)
Basophils Absolute: 0 10*3/uL (ref 0.0–0.1)
Basophils Relative: 0 %
Eosinophils Absolute: 0 10*3/uL (ref 0.0–0.5)
Eosinophils Relative: 0 %
HCT: 58.3 % — ABNORMAL HIGH (ref 39.0–52.0)
Hemoglobin: 19.7 g/dL — ABNORMAL HIGH (ref 13.0–17.0)
Immature Granulocytes: 0 %
Lymphocytes Relative: 7 %
Lymphs Abs: 0.7 10*3/uL (ref 0.7–4.0)
MCH: 29.2 pg (ref 26.0–34.0)
MCHC: 33.8 g/dL (ref 30.0–36.0)
MCV: 86.5 fL (ref 80.0–100.0)
Monocytes Absolute: 0.6 10*3/uL (ref 0.1–1.0)
Monocytes Relative: 6 %
Neutro Abs: 8.7 10*3/uL — ABNORMAL HIGH (ref 1.7–7.7)
Neutrophils Relative %: 87 %
Platelets: 251 10*3/uL (ref 150–400)
RBC: 6.74 MIL/uL — ABNORMAL HIGH (ref 4.22–5.81)
RDW: 13.4 % (ref 11.5–15.5)
WBC: 10.1 10*3/uL (ref 4.0–10.5)
nRBC: 0 % (ref 0.0–0.2)

## 2021-01-31 LAB — ETHANOL: Alcohol, Ethyl (B): <10 mg/dL (ref <10)

## 2021-01-31 NOTE — ED Triage Notes (Signed)
Pt arrived via [facility] from parking lot.   Pt called out due to argument with woman friend.   CBG-300 reports he had a large breakfast today but normally compliant with meds.....   Denies alcohol today.  A/Ox4 Vs-ems 188/112 HR-110 Rr-22 100% RA  Also report mild abd. Pain X2 days. Denies N/v or diarrhea. Denies chest pain or shob.

## 2021-01-31 NOTE — ED Notes (Addendum)
Pt told this RN he was leaving and already called his ride. PA made aware. Asked patient to sign out AMA and reviewed risk and benefits of leaving. This RN directed pt back to his room. Pt refused to sign out AMA and states his ride was outside and needs to leave now and states he does not have time to sign. Patient walked outside.

## 2021-01-31 NOTE — ED Provider Notes (Cosign Needed)
Emergency Medicine Provider Triage Evaluation Note  [name] , a 65 y.o. male  was evaluated in triage.  Pt complains of depression. He called EMS after he got into an argument with his significant other. Denies si.  Review of Systems  Positive: Depression, anxiety Negative: si  Physical Exam  There were no vitals taken for this visit. Gen:   Awake, no distress   Resp:  Normal effort  MSK:   Moves extremities without difficulty  Other:  tearful  Medical Decision Making  Medically screening exam initiated at 2:43 PM.  Appropriate orders placed.  LOIS OSTROM was informed that the remainder of the evaluation will be completed by another provider, this initial triage assessment does not replace that evaluation, and the importance of remaining in the ED until their evaluation is complete.     Rodney Booze, PA-C 01/31/21 1444

## 2021-01-31 NOTE — ED Provider Notes (Signed)
[name] Provider Note   CSN: [id number] Arrival date & time: 01/31/21  1434     History Chief Complaint  Patient presents with   Depression    [name] is a 65 y.o. male with PMHx HTN, GERD, Diabetes, CKD who presents to the ED today via EMS for depression.  Patient apparently called EMS from the parking lot after he got into an argument with a woman friend.  He was noted to be tachycardic on arrival.  Patient stated that his heart has been racing for the past week.  He states he is also felt sick including nausea and vomiting.  He states that he has lost approximately 30 pounds in the past 4 to 5 months and has not been evaluated for same.  He also complains of intermittent chest pain and shortness of breath.  Patient denies smoking history.  He denied any SI, HI, AVH.   The history is provided by the patient, medical records and the EMS personnel.      Past Medical History:  Diagnosis Date   Acute kidney injury superimposed on chronic kidney disease ([place]) 02/10/2020   Acute on chronic kidney failure ([place]) 09/28/2019   Closed nondisplaced spiral fracture of shaft of right tibia    Complicated urinary tract infection 09/06/2016   Depression    Diabetes mellitus without complication (HCC)    GERD (gastroesophageal reflux disease)    Gout    right foot   Hemorrhoids    Hypertension    Hypertension    Macular degeneration    Microcytic anemia    Prostate cancer ([place]) 08/2009   Rectal bleeding 07/31/2016   Tubular adenoma     Patient Active Problem List   Diagnosis Date Noted   Type 2 diabetes mellitus ([place]) 01/05/2021   HLD (hyperlipidemia) 01/05/2021   Fall 11/11/2020   Hypertensive urgency, malignant 10/25/2020   Acute urinary retention 10/25/2020   Adjustment disorder with mixed anxiety and depressed mood    Acute renal failure superimposed on stage 3a chronic kidney disease ([place]) 09/15/2020   Ataxia 08/01/2020   Dizziness  07/31/2020   Chest pain 07/31/2020   Hip pain 07/31/2020   AKI (acute kidney injury) ([facility]) 02/11/2020   Hydroureteronephrosis 02/10/2020   Nausea and vomiting 02/10/2020   History of COVID-19 02/10/2020   Pneumonia due to COVID-19 virus 09/29/2019   Hyponatremia 09/28/2019   Sinus tachycardia 09/28/2019   Insomnia 08/26/2018   Nephropathy 03/26/2018   Urinary urgency 03/26/2018   Type 2 diabetes mellitus with diabetic nephropathy, without long-term current use of insulin ([place]) 03/26/2018   Essential hypertension 03/26/2018   Chronic kidney disease (CKD), stage III (moderate) ([place]) 03/26/2018   Conductive hearing loss, bilateral 11/07/2017   Uncontrolled type 2 diabetes mellitus with hyperglycemia ([place]) 07/31/2016   Dyslipidemia associated with type 2 diabetes mellitus ([place]) 05/29/2016   Microcytic anemia 02/17/2012   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   Benign hypertension 02/17/2012   Depressive disorder 02/17/2012    Past Surgical History:  Procedure Laterality Date   CYSTOSCOPY WITH RETROGRADE PYELOGRAM, URETEROSCOPY AND STENT PLACEMENT Right 07/31/2016   Procedure: CYSTOSCOPY WITH RIGHT  RETROGRADE PYELOGRAM, URETEROSCOPY;  Surgeon: [name], MD;  Location: [facility];  Service: Urology;  Laterality: Right;   INGUINAL HERNIA REPAIR     Left ankle joint fusion  1981   PROSTATE BIOPSY     x 2   URETERAL REIMPLANTION  07/31/2016   Procedure: URETERAL REIMPLANT, right boari flap,  right psoas hitch;  Surgeon: [name], MD;  Location: [facility];  Service: Urology;;       Family History  Problem Relation Age of Onset   Lung cancer Mother        Deceased   Throat cancer Brother    Pancreatic cancer Father    Heart disease Father        Deceased   Lung cancer Maternal Uncle        nephew   Diabetes Other     Social History   Tobacco Use   Smoking status: Never   Smokeless tobacco: Never  Vaping Use   Vaping Use: Never used  Substance Use Topics    Alcohol use: Yes    Comment: ocassionally   Drug use: No    Home Medications Prior to Admission medications   Medication Sig Start Date End Date Taking? Authorizing Provider  acetaminophen (TYLENOL) 325 MG tablet Take 2 tablets (650 mg total) by mouth every 6 (six) hours as needed for mild pain (or Fever >/= 101). 02/12/20   Domenic Polite, MD  alfuzosin (UROXATRAL) 10 MG 24 hr tablet Take 1 tablet (10 mg total) by mouth daily with breakfast. 11/15/20   Hollice Espy, MD  amLODipine (NORVASC) 5 MG tablet Take 1 tablet (5 mg total) by mouth daily. 01/07/21   Regalado, Belkys A, MD  ARIPiprazole (ABILIFY) 2 MG tablet TAKE 1 TABLET BY MOUTH EVERY DAY Patient taking differently: Take 2 mg by mouth daily. 01/03/21   [name], [name]  aspirin EC 81 MG tablet Take 81 mg by mouth daily with breakfast.     [provider]  atorvastatin (LIPITOR) 40 MG tablet 1 tablet by mouth Monday - Friday Patient taking differently: Take 40 mg by mouth daily. 12/06/20   [name], [name]  clotrimazole-betamethasone (LOTRISONE) cream Apply 1 application topically 2 (two) times daily. 07/17/20   [name], [name]  diclofenac Sodium (VOLTAREN) 1 % GEL Apply 4 g topically 4 (four) times daily. 09/03/20   Palumbo, April, MD  empagliflozin (JARDIANCE) 10 MG TABS tablet Take 10 mg by mouth daily.    [provider]  finasteride (PROSCAR) 5 MG tablet Take 1 tablet (5 mg total) by mouth daily. 12/12/20   Hollice Espy, MD  metoprolol tartrate (LOPRESSOR) 50 MG tablet Take 1 tablet (50 mg total) by mouth 2 (two) times daily. Patient not taking: No sig reported 12/06/20   [name], [name]  Multiple Vitamin (MULTIVITAMIN WITH MINERALS) TABS tablet Take 1 tablet by mouth daily.    [provider]  nystatin (NYSTATIN) powder Apply 1 application topically 3 (three) times daily. 08/26/19   [name], [name]  pantoprazole (PROTONIX) 40 MG tablet Take 1 tablet (40 mg total) by mouth 2 (two) times  daily. 01/07/21   Regalado, Belkys A, MD  Semaglutide (RYBELSUS) 14 MG TABS Take 1 tablet by mouth daily. Take 30 minutes before breakfast 12/06/20   [name], [name]  Tetrahydrozoline HCl (VISINE OP) Place 1 drop into both eyes daily as needed (itching/irritation).    [provider]  traZODone (DESYREL) 50 MG tablet TAKE 1 TABLET (50 MG TOTAL) BY MOUTH AT BEDTIME AS NEEDED FOR SLEEP. 06/05/20   [name], [name]    Allergies    Haldol [haloperidol]  Review of Systems   Review of Systems  Respiratory:  Positive for shortness of breath.   Cardiovascular:  Positive for chest pain and palpitations.  Gastrointestinal:  Positive for nausea and vomiting.  Psychiatric/Behavioral:  Positive for depression. Negative for hallucinations and suicidal ideas.   All other systems reviewed and are negative.  Physical Exam Updated Vital Signs BP (!) 166/112 (BP Location: Left Arm)   Pulse (!) 123   Temp 98 F (36.7 C) (Oral)   Resp (!) 23   SpO2 98%   Physical Exam Vitals and nursing note reviewed.  Constitutional:      Appearance: He is not ill-appearing or diaphoretic.     Comments: Tearful on exam  HENT:     Head: Normocephalic and atraumatic.  Eyes:     Conjunctiva/sclera: Conjunctivae normal.  Cardiovascular:     Rate and Rhythm: Regular rhythm. Tachycardia present.  Pulmonary:     Effort: Pulmonary effort is normal.     Breath sounds: Normal breath sounds. No wheezing, rhonchi or rales.  Abdominal:     Palpations: Abdomen is soft.     Tenderness: There is no abdominal tenderness. There is no guarding or rebound.  Musculoskeletal:     Cervical back: Neck supple.  Skin:    General: Skin is warm and dry.  Neurological:     Mental Status: He is alert.    ED Results / Procedures / Treatments   Labs (all labs ordered are listed, but only abnormal results are displayed) Labs Reviewed  BASIC METABOLIC PANEL - Abnormal; Notable for the following components:      Result  Value   Glucose, Bld 295 (*)    BUN 35 (*)    Creatinine, Ser 1.71 (*)    GFR, Estimated 43 (*)    All other components within normal limits  ETHANOL  CBC WITH DIFFERENTIAL/PLATELET    EKG EKG Interpretation  Date/Time:  Wednesday January 31 2021 15:18:34 EST Ventricular Rate:  110 PR Interval:  132 QRS Duration: 84 QT Interval:  313 QTC Calculation: 424 R Axis:   -30 Text Interpretation: Sinus tachycardia Probable left atrial enlargement Inferior infarct, old Consider anterior infarct No significant change since last tracing Confirmed by Wandra Arthurs [id number] on 01/31/2021 3:21:33 PM  Radiology No results found.  Procedures Procedures   Medications Ordered in ED Medications - No data to display  ED Course  I have reviewed the triage vital signs and the nursing notes.  Pertinent labs & imaging results that were available during my care of the patient were reviewed by me and considered in my medical decision making (see chart for details).    MDM Rules/Calculators/A&P                           65 year old male who presents to the ED initially today for depression.  He had gotten into a fight with a significant other and called EMS as he was significantly upset.  On arrival to the ED he was noted to be tachycardic and tachypneic however actively tearful on exam.  He had screening labs done including a CBC, BMP, EtOH as well as the EKG due to tachycardia.  Her to labs returning and during my evaluation patient complains of heart palpitations, intermittent chest pain/shortness of breath, nausea, vomiting, decreased weight.  Patient work-up started and as I was in the process of ordering labs nursing staff informed me that patient wanted to leave.  He was advised that he would need to sign out [place] however he refused and walked out the door.  I was unable to talk to him regarding risks of leaving prior to  full work-up.  He did deny any SI, HI, AVH and did not  appear to be a threat to himself or others.  Do not think he needs to be collected by [employer] at this time for IVC placement.   This note was prepared using Dragon voice recognition software and may include unintentional dictation errors due to the inherent limitations of voice recognition software.   Final Clinical Impression(s) / ED Diagnoses Final diagnoses:  Depression, unspecified depression type    Rx / DC Orders ED Discharge Orders     None        Eustaquio Maize, PA-C 01/31/21 1540    Drenda Freeze, MD 01/31/21 [phone]

## 2021-02-01 ENCOUNTER — Emergency Department (HOSPITAL_COMMUNITY)
Admission: EM | Admit: 2021-02-01 | Discharge: 2021-02-01 | Disposition: A | Discharge status: Home / Self Care | Payer: Medicare Other | Attending: Emergency Medicine | Admitting: Emergency Medicine

## 2021-02-01 ENCOUNTER — Other Ambulatory Visit: Payer: Self-pay

## 2021-02-01 ENCOUNTER — Encounter (HOSPITAL_COMMUNITY): Payer: Self-pay | Admitting: Emergency Medicine

## 2021-02-01 DIAGNOSIS — R1032 Left lower quadrant pain: Secondary | ICD-10-CM | POA: Insufficient documentation

## 2021-02-01 DIAGNOSIS — Z8546 Personal history of malignant neoplasm of prostate: Secondary | ICD-10-CM | POA: Diagnosis not present

## 2021-02-01 DIAGNOSIS — K219 Gastro-esophageal reflux disease without esophagitis: Secondary | ICD-10-CM | POA: Insufficient documentation

## 2021-02-01 DIAGNOSIS — N1831 Chronic kidney disease, stage 3a: Secondary | ICD-10-CM | POA: Insufficient documentation

## 2021-02-01 DIAGNOSIS — Z7984 Long term (current) use of oral hypoglycemic drugs: Secondary | ICD-10-CM | POA: Insufficient documentation

## 2021-02-01 DIAGNOSIS — Z7982 Long term (current) use of aspirin: Secondary | ICD-10-CM | POA: Insufficient documentation

## 2021-02-01 DIAGNOSIS — E1122 Type 2 diabetes mellitus with diabetic chronic kidney disease: Secondary | ICD-10-CM | POA: Insufficient documentation

## 2021-02-01 DIAGNOSIS — Z79899 Other long term (current) drug therapy: Secondary | ICD-10-CM | POA: Insufficient documentation

## 2021-02-01 DIAGNOSIS — R109 Unspecified abdominal pain: Secondary | ICD-10-CM | POA: Diagnosis not present

## 2021-02-01 DIAGNOSIS — Z743 Need for continuous supervision: Secondary | ICD-10-CM | POA: Diagnosis not present

## 2021-02-01 DIAGNOSIS — R739 Hyperglycemia, unspecified: Secondary | ICD-10-CM | POA: Diagnosis not present

## 2021-02-01 DIAGNOSIS — Z8616 Personal history of COVID-19: Secondary | ICD-10-CM | POA: Insufficient documentation

## 2021-02-01 DIAGNOSIS — I129 Hypertensive chronic kidney disease with stage 1 through stage 4 chronic kidney disease, or unspecified chronic kidney disease: Secondary | ICD-10-CM | POA: Diagnosis not present

## 2021-02-01 DIAGNOSIS — R1031 Right lower quadrant pain: Secondary | ICD-10-CM | POA: Diagnosis not present

## 2021-02-01 DIAGNOSIS — R10A0 Flank pain, unspecified side: Secondary | ICD-10-CM

## 2021-02-01 DIAGNOSIS — R1030 Lower abdominal pain, unspecified: Secondary | ICD-10-CM | POA: Diagnosis not present

## 2021-02-01 DIAGNOSIS — R6889 Other general symptoms and signs: Secondary | ICD-10-CM | POA: Diagnosis not present

## 2021-02-01 LAB — URINALYSIS, ROUTINE W REFLEX MICROSCOPIC
Bacteria, UA: NONE SEEN
Bilirubin Urine: NEGATIVE
Glucose, UA: >=500 mg/dL — AB
Ketones, ur: NEGATIVE mg/dL
Leukocytes,Ua: NEGATIVE
Nitrite: NEGATIVE
Protein, ur: 100 mg/dL — AB
Specific Gravity, Urine: 1.018 (ref 1.005–1.030)
pH: 5 (ref 5.0–8.0)

## 2021-02-01 MED ORDER — ACETAMINOPHEN 325 MG PO TABS
650.0000 mg | ORAL_TABLET | Freq: Once | ORAL | Status: AC
  Administered 2021-02-01: 10:00:00 650 mg via ORAL
  Filled 2021-02-01: qty 2

## 2021-02-01 NOTE — Discharge Instructions (Signed)
Recheck with your provider, call to schedule an appointment.

## 2021-02-01 NOTE — ED Triage Notes (Signed)
Pt arrives via EMS for [place] in abd pain. Denies vomiting.  BP 160/102, HR 101

## 2021-02-01 NOTE — ED Provider Notes (Signed)
[facility] EMERGENCY DEPARTMENT Provider Note   CSN: [id number] Arrival date & time: 02/01/21  0840     History Chief Complaint  Patient presents with   Abdominal Pain    [name] is a 65 y.o. male.  65 year old male with history of CKD, DM, additional history as listed below, presents with right flank pain, intermittent x several months, onset midnight. Denies any associated nausea, vomiting, changes in bowel or bladder habits. Pain radiates to right groin. Patient was seen in the ER yesterday for depression. No other complaints or concerns.       Past Medical History:  Diagnosis Date   Acute kidney injury superimposed on chronic kidney disease ([place]) 02/10/2020   Acute on chronic kidney failure ([place]) 09/28/2019   Closed nondisplaced spiral fracture of shaft of right tibia    Complicated urinary tract infection 09/06/2016   Depression    Diabetes mellitus without complication (HCC)    GERD (gastroesophageal reflux disease)    Gout    right foot   Hemorrhoids    Hypertension    Hypertension    Macular degeneration    Microcytic anemia    Prostate cancer ([place]) 08/2009   Rectal bleeding 07/31/2016   Tubular adenoma     Patient Active Problem List   Diagnosis Date Noted   Type 2 diabetes mellitus ([place]) 01/05/2021   HLD (hyperlipidemia) 01/05/2021   Fall 11/11/2020   Hypertensive urgency, malignant 10/25/2020   Acute urinary retention 10/25/2020   Adjustment disorder with mixed anxiety and depressed mood    Acute renal failure superimposed on stage 3a chronic kidney disease ([place]) 09/15/2020   Ataxia 08/01/2020   Dizziness 07/31/2020   Chest pain 07/31/2020   Hip pain 07/31/2020   AKI (acute kidney injury) ([place]) 02/11/2020   Hydroureteronephrosis 02/10/2020   Nausea and vomiting 02/10/2020   History of COVID-19 02/10/2020   Pneumonia due to COVID-19 virus 09/29/2019   Hyponatremia 09/28/2019   Sinus tachycardia 09/28/2019   Insomnia  08/26/2018   Nephropathy 03/26/2018   Urinary urgency 03/26/2018   Type 2 diabetes mellitus with diabetic nephropathy, without long-term current use of insulin ([place]) 03/26/2018   Essential hypertension 03/26/2018   Chronic kidney disease (CKD), stage III (moderate) ([place]) 03/26/2018   Conductive hearing loss, bilateral 11/07/2017   Uncontrolled type 2 diabetes mellitus with hyperglycemia ([place]) 07/31/2016   Dyslipidemia associated with type 2 diabetes mellitus ([place]) 05/29/2016   Microcytic anemia 02/17/2012   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   Benign hypertension 02/17/2012   Depressive disorder 02/17/2012    Past Surgical History:  Procedure Laterality Date   CYSTOSCOPY WITH RETROGRADE PYELOGRAM, URETEROSCOPY AND STENT PLACEMENT Right 07/31/2016   Procedure: CYSTOSCOPY WITH RIGHT  RETROGRADE PYELOGRAM, URETEROSCOPY;  Surgeon: [name], MD;  Location: [facility];  Service: Urology;  Laterality: Right;   INGUINAL HERNIA REPAIR     Left ankle joint fusion  1981   PROSTATE BIOPSY     x 2   URETERAL REIMPLANTION  07/31/2016   Procedure: URETERAL REIMPLANT, right boari flap, right psoas hitch;  Surgeon: [name], MD;  Location: [facility];  Service: Urology;;       Family History  Problem Relation Age of Onset   Lung cancer Mother        Deceased   Throat cancer Brother    Pancreatic cancer Father    Heart disease Father        Deceased   Lung cancer Maternal  Uncle        nephew   Diabetes Other     Social History   Tobacco Use   Smoking status: Never   Smokeless tobacco: Never  Vaping Use   Vaping Use: Never used  Substance Use Topics   Alcohol use: Yes    Comment: ocassionally   Drug use: No    Home Medications Prior to Admission medications   Medication Sig Start Date End Date Taking? Authorizing Provider  acetaminophen (TYLENOL) 325 MG tablet Take 2 tablets (650 mg total) by mouth every 6 (six) hours as needed for mild pain (or Fever >/= 101).  02/12/20   Domenic Polite, MD  alfuzosin (UROXATRAL) 10 MG 24 hr tablet Take 1 tablet (10 mg total) by mouth daily with breakfast. 11/15/20   Hollice Espy, MD  amLODipine (NORVASC) 5 MG tablet Take 1 tablet (5 mg total) by mouth daily. 01/07/21   Regalado, Belkys A, MD  ARIPiprazole (ABILIFY) 2 MG tablet TAKE 1 TABLET BY MOUTH EVERY DAY Patient taking differently: Take 2 mg by mouth daily. 01/03/21   [name], [name]  aspirin EC 81 MG tablet Take 81 mg by mouth daily with breakfast.     [provider]  atorvastatin (LIPITOR) 40 MG tablet 1 tablet by mouth Monday - Friday Patient taking differently: Take 40 mg by mouth daily. 12/06/20   [name], [name]  clotrimazole-betamethasone (LOTRISONE) cream Apply 1 application topically 2 (two) times daily. 07/17/20   [name], [name]  diclofenac Sodium (VOLTAREN) 1 % GEL Apply 4 g topically 4 (four) times daily. 09/03/20   Palumbo, April, MD  empagliflozin (JARDIANCE) 10 MG TABS tablet Take 10 mg by mouth daily.    [provider]  finasteride (PROSCAR) 5 MG tablet Take 1 tablet (5 mg total) by mouth daily. 12/12/20   Hollice Espy, MD  metoprolol tartrate (LOPRESSOR) 50 MG tablet Take 1 tablet (50 mg total) by mouth 2 (two) times daily. Patient not taking: No sig reported 12/06/20   [name], [name]  Multiple Vitamin (MULTIVITAMIN WITH MINERALS) TABS tablet Take 1 tablet by mouth daily.    [provider]  nystatin (NYSTATIN) powder Apply 1 application topically 3 (three) times daily. 08/26/19   [name], [name]  pantoprazole (PROTONIX) 40 MG tablet Take 1 tablet (40 mg total) by mouth 2 (two) times daily. 01/07/21   Regalado, Belkys A, MD  Semaglutide (RYBELSUS) 14 MG TABS Take 1 tablet by mouth daily. Take 30 minutes before breakfast 12/06/20   [name], [name]  Tetrahydrozoline HCl (VISINE OP) Place 1 drop into both eyes daily as needed (itching/irritation).    [provider]  traZODone (DESYREL) 50 MG  tablet TAKE 1 TABLET (50 MG TOTAL) BY MOUTH AT BEDTIME AS NEEDED FOR SLEEP. 06/05/20   [name], [name]    Allergies    Haldol [haloperidol]  Review of Systems   Review of Systems  Constitutional:  Negative for chills and fever.  Respiratory:  Negative for shortness of breath.   Cardiovascular:  Negative for chest pain.  Gastrointestinal:  Positive for abdominal pain. Negative for constipation, diarrhea, nausea and vomiting.  Genitourinary:  Positive for flank pain and testicular pain. Negative for difficulty urinating, dysuria, frequency, hematuria and urgency.  Musculoskeletal:  Negative for arthralgias and myalgias.  Skin:  Negative for rash and wound.  Allergic/Immunologic: Positive for immunocompromised state.  Neurological:  Negative for weakness.  Hematological:  Negative for adenopathy.  Psychiatric/Behavioral:  Negative for confusion.  All other systems reviewed and are negative.  Physical Exam Updated Vital Signs BP 103/80   Pulse 69   Temp 98 F (36.7 C) (Oral)   Resp 20   SpO2 95%   Physical Exam Vitals and nursing note reviewed. Exam conducted with a chaperone present.  Constitutional:      General: He is not in acute distress.    Appearance: He is well-developed. He is not diaphoretic.  HENT:     Head: Normocephalic and atraumatic.  Cardiovascular:     Rate and Rhythm: Normal rate and regular rhythm.     Heart sounds: Normal heart sounds.  Pulmonary:     Effort: Pulmonary effort is normal.     Breath sounds: Normal breath sounds.  Abdominal:     General: Bowel sounds are normal. There is no distension.     Palpations: Abdomen is soft.     Tenderness: There is abdominal tenderness in the right lower quadrant and left lower quadrant.     Hernia: No hernia is present.  Genitourinary:    Penis: Normal.      Testes:        Right: Mass, tenderness or swelling not present.        Left: Mass, tenderness or swelling not present.  Skin:    General: Skin is  warm and dry.     Findings: No erythema or rash.  Neurological:     Mental Status: He is alert and oriented to person, place, and time.  Psychiatric:        Behavior: Behavior normal.    ED Results / Procedures / Treatments   Labs (all labs ordered are listed, but only abnormal results are displayed) Labs Reviewed  URINALYSIS, ROUTINE W REFLEX MICROSCOPIC - Abnormal; Notable for the following components:      Result Value   Glucose, UA >=500 (*)    Hgb urine dipstick SMALL (*)    Protein, ur 100 (*)    All other components within normal limits    EKG None  Radiology No results found.  Procedures Procedures   Medications Ordered in ED Medications  acetaminophen (TYLENOL) tablet 650 mg (650 mg Oral Given 02/01/21 1011)    ED Course  I have reviewed the triage vital signs and the nursing notes.  Pertinent labs & imaging results that were available during my care of the patient were reviewed by me and considered in my medical decision making (see chart for details).  Clinical Course as of 02/01/21 1250  Thu Feb 02, 2339  2371 66 year old male with complaint of recurrent right flank to right groin pain. Found to have mild lower abdominal tenderness. Chaperone present for exam, no testicular swelling, redness, significant pain.  Records reviewed patient's prior testicular ultrasounds this year and largely unremarkable. CBC and BMP reviewed from yesterday and compared to previous without significant changes from prior. [LM]  1219 UA returns with elevated glucose, negative for leukocytes and nitrites, doubt UTI. Negative for hgb, do not suspect stone. Specific gravity normal and ketone negative, doubt DKA. Patient is reassured he is not likely in kidney failure today. Agrees to call PCP for follow up.  [LM]    Clinical Course User Index [LM] Roque Lias   MDM Rules/Calculators/A&P                           Final Clinical Impression(s) / ED Diagnoses Final  diagnoses:  Flank pain  Rx / DC Orders ED Discharge Orders     None        Roque Lias 02/01/21 1250    Truddie Hidden, MD 02/01/21 [phone]

## 2021-02-09 ENCOUNTER — Emergency Department (HOSPITAL_COMMUNITY)
Admission: EM | Admit: 2021-02-09 | Discharge: 2021-02-10 | Disposition: A | Discharge status: Home / Self Care | Payer: Medicare Other | Attending: Student | Admitting: Student

## 2021-02-09 ENCOUNTER — Other Ambulatory Visit: Payer: Self-pay

## 2021-02-09 ENCOUNTER — Encounter (HOSPITAL_COMMUNITY): Payer: Self-pay

## 2021-02-09 DIAGNOSIS — R079 Chest pain, unspecified: Secondary | ICD-10-CM | POA: Diagnosis not present

## 2021-02-09 DIAGNOSIS — Z743 Need for continuous supervision: Secondary | ICD-10-CM | POA: Diagnosis not present

## 2021-02-09 DIAGNOSIS — Z5321 Procedure and treatment not carried out due to patient leaving prior to being seen by health care provider: Secondary | ICD-10-CM | POA: Insufficient documentation

## 2021-02-09 DIAGNOSIS — R5383 Other fatigue: Secondary | ICD-10-CM | POA: Diagnosis not present

## 2021-02-09 DIAGNOSIS — R0789 Other chest pain: Secondary | ICD-10-CM | POA: Insufficient documentation

## 2021-02-09 DIAGNOSIS — R739 Hyperglycemia, unspecified: Secondary | ICD-10-CM | POA: Diagnosis not present

## 2021-02-09 DIAGNOSIS — R0902 Hypoxemia: Secondary | ICD-10-CM | POA: Diagnosis not present

## 2021-02-09 LAB — CBC WITH DIFFERENTIAL/PLATELET
Abs Immature Granulocytes: 0.04 10*3/uL (ref 0.00–0.07)
Basophils Absolute: 0 10*3/uL (ref 0.0–0.1)
Basophils Relative: 0 %
Eosinophils Absolute: 0.1 10*3/uL (ref 0.0–0.5)
Eosinophils Relative: 1 %
HCT: 55.4 % — ABNORMAL HIGH (ref 39.0–52.0)
Hemoglobin: 18.6 g/dL — ABNORMAL HIGH (ref 13.0–17.0)
Immature Granulocytes: 0 %
Lymphocytes Relative: 11 %
Lymphs Abs: 1.1 10*3/uL (ref 0.7–4.0)
MCH: 29.2 pg (ref 26.0–34.0)
MCHC: 33.6 g/dL (ref 30.0–36.0)
MCV: 86.8 fL (ref 80.0–100.0)
Monocytes Absolute: 0.6 10*3/uL (ref 0.1–1.0)
Monocytes Relative: 6 %
Neutro Abs: 8 10*3/uL — ABNORMAL HIGH (ref 1.7–7.7)
Neutrophils Relative %: 82 %
Platelets: 214 10*3/uL (ref 150–400)
RBC: 6.38 MIL/uL — ABNORMAL HIGH (ref 4.22–5.81)
RDW: 12.9 % (ref 11.5–15.5)
WBC: 9.7 10*3/uL (ref 4.0–10.5)
nRBC: 0 % (ref 0.0–0.2)

## 2021-02-09 LAB — BASIC METABOLIC PANEL
Anion gap: 13 (ref 5–15)
BUN: 31 mg/dL — ABNORMAL HIGH (ref 8–23)
CO2: 23 mmol/L (ref 22–32)
Calcium: 9.7 mg/dL (ref 8.9–10.3)
Chloride: 100 mmol/L (ref 98–111)
Creatinine, Ser: 1.68 mg/dL — ABNORMAL HIGH (ref 0.61–1.24)
GFR, Estimated: 44 mL/min — ABNORMAL LOW (ref >60)
Glucose, Bld: 273 mg/dL — ABNORMAL HIGH (ref 70–99)
Potassium: 4.4 mmol/L (ref 3.5–5.1)
Sodium: 136 mmol/L (ref 135–145)

## 2021-02-09 LAB — BRAIN NATRIURETIC PEPTIDE: B Natriuretic Peptide: 16.2 pg/mL (ref 0.0–100.0)

## 2021-02-09 LAB — TROPONIN I (HIGH SENSITIVITY): Troponin I (High Sensitivity): 8 ng/L (ref <18)

## 2021-02-09 MED ORDER — NITROGLYCERIN 0.4 MG SL SUBL
0.4000 mg | SUBLINGUAL_TABLET | SUBLINGUAL | Status: DC | PRN
  Administered 2021-02-09: 0.4 mg via SUBLINGUAL
  Filled 2021-02-09: qty 1

## 2021-02-09 NOTE — ED Notes (Signed)
Called pt 3x, no response.

## 2021-02-09 NOTE — ED Provider Notes (Signed)
Emergency Medicine Provider Triage Evaluation Note  FLYNN GWYN , a 65 y.o. male  was evaluated in triage.  Pt complains of central chest pain and tightness for the last 3 weeks worsened in the last 48 hours that radiates to the left neck.  No associated numbness, tingling in the arms, nausea, vomiting, or shortness of breath.  Patient denies any known cardiac history.  On aspirin daily but no other anticoagulation.  Review of Systems  Positive: Chest pain that radiates Negative: Shortness of breath, palpitations, nausea, vomiting, shortness of breath  Physical Exam  BP (!) 139/102 (BP Location: Right Arm)   Pulse 84   Temp 99.4 F (37.4 C)   Resp 16   Ht 5\' 6"  (1.676 m)   Wt 80.3 kg   SpO2 97%   BMI 28.57 kg/m  Gen:   Awake, no distress   Resp:  Normal effort  MSK:   Moves extremities without difficulty  Other:  RRR no M Starkes G.  Lungs CTA B.  Pulses are symmetric.  Medical Decision Making  Medically screening exam initiated at 2:09 PM.  Appropriate orders placed.  UNDRAY ALLMAN was informed that the remainder of the evaluation will be completed by another provider, this initial triage assessment does not replace that evaluation, and the importance of remaining in the ED until their evaluation is complete.  Nitroglycerin ordered, chest pain work-up started.  This chart was dictated using voice recognition software, Dragon. Despite the best efforts of this provider to proofread and correct errors, errors may still occur which can change documentation meaning.    Emeline Darling, PA-C 02/09/21 1412    Jeanell Sparrow, DO 02/10/21 1622

## 2021-02-09 NOTE — ED Triage Notes (Signed)
Pt bib ems for central chest tightness for the past 3 weeks, with some fatigue. Pt a.o  160/100 HR 100 97%  CBG 285

## 2021-02-13 DIAGNOSIS — L853 Xerosis cutis: Secondary | ICD-10-CM | POA: Diagnosis not present

## 2021-02-13 DIAGNOSIS — L308 Other specified dermatitis: Secondary | ICD-10-CM | POA: Diagnosis not present

## 2021-02-13 DIAGNOSIS — Z85828 Personal history of other malignant neoplasm of skin: Secondary | ICD-10-CM | POA: Diagnosis not present

## 2021-02-14 DIAGNOSIS — N1831 Chronic kidney disease, stage 3a: Secondary | ICD-10-CM

## 2021-02-14 DIAGNOSIS — E1121 Type 2 diabetes mellitus with diabetic nephropathy: Secondary | ICD-10-CM

## 2021-02-14 DIAGNOSIS — I10 Essential (primary) hypertension: Secondary | ICD-10-CM

## 2021-02-21 ENCOUNTER — Encounter: Payer: Self-pay | Admitting: Nurse Practitioner

## 2021-02-22 ENCOUNTER — Telehealth: Payer: Self-pay

## 2021-02-22 NOTE — Progress Notes (Signed)
Chronic Care Management Pharmacy Assistant   Name: [name]  MRN: [id number] DOB: September 02, 1955    Reason for Encounter: Disease State/ Hypertension Adherence Call   Conditions to be addressed/monitored:Hypertension    Recent office visits:  01/30/2021 Hiko ( CCM)   01/11/2021 Barb Merino RN ( CCM)   01/11/2021 [name] LSW ( CCM)   12/22/2020 Barb Merino RN ( CCM)  12/22/2020 [name] LSW ( CCM)  12/18/2020 [name] LSW ( CCM0  12/18/2020 Barb Merino RN ( CCM)  12/13/2020 [name] LSW ( CCM)  12/12/2020 Barb Merino RN ( CCM)  12/06/2020 [name] [name] (PCP) start Ariprazole 2 mg oral daily, Semaglutide 14 mg 1 tablet oral daily, take 30 minutes before breakfast, Tetanus-Diphth-Acell Pertussis 5-2.5-18.5 LF-MCG/0.5 0.5 mLs Intramuscular  Once given ,  Zoster Vac Recomb Adjuvanted 50 MCG/0.5ML 0.5 mLs Intramuscular  Once given. Stop Linagliptin 5mg   12/05/2020 Barb Merino RN ( CCM)  12/04/2020 [name] LSW ( CCM)  11/17/2020 [name] LSW ( CCM)  11/15/2020 [name] LSW ( CCM)  11/06/2020 [name] LSW ( CCM)  10/31/2020 [name] LSW ( CCM)  10/30/2020 [name] LSW ( CCM)  10/26/2020 [name] LSW ( CCM)   Recent consult visits:   12/12/2020 Hollice Espy MD ( Urology) start Finasteride 5mg  oral daily  11/30/2020 Sheilah Mins MD ( Urology) start Sildenafil 20mg  take 1 tablet daily as needed   11/15/2020 Hollice Espy MD ( Urology) start Alfuzosin 10mg  daily with breakfast, stop Mirabegron 25mg   11/03/2020 Ernest Mallick PA-C ( Urology) no medication changes noted  Hospital visits:  Medication Reconciliation was completed by comparing discharge summary, patient's EMR and Pharmacy list, and upon discussion with patient.  Admitted to the hospital on 02/09/2021 due to Chest Pain. Discharge date was 02/10/2021. Discharged from [facility].  Patient  Eloped  New?Medications Started at [facility] Discharge:??Nitroglycerin 0.4mg  0.4 mg, Sublingual, Every 5 min PRN, chest pain, Starting on Fri 02/09/21 at 1406  Medication Changes at Hospital Discharge:None  Medications Discontinued at Hospital Discharge: None  Medications that remain the same after Hospital Discharge:??  -All other medications will remain the same.    Medication Reconciliation was completed by comparing discharge summary, patient's EMR and Pharmacy list, and upon discussion with patient.  Admitted to the hospital on 02/01/2021 due to Flank Pain. Discharge date was 02/01/2021. Discharged from [place]?Medications Started at [facility] Discharge:??given Acetaminophen 650mg  1 tablet oral once  Medication Changes at Hospital Discharge: None  Medications Discontinued at Hospital Discharge: None  Medications that remain the same after Hospital Discharge:??  -All other medications will remain the same.    Medication Reconciliation was completed by comparing discharge summary, patient's EMR and Pharmacy list, and upon discussion with patient.  Admitted to the hospital on 01/31/2021 due to Depression, unspecified depression type. Discharge date was 01/31/2021. Discharged from [facility].  Patient left AMA.  New?Medications Started at [facility] Discharge:??None  Medication Changes at Hospital Discharge: None  Medications Discontinued at Hospital Discharge: None  Medications that remain the same after Hospital Discharge:??  -All other medications will remain the same.    Medication Reconciliation was completed by comparing discharge summary, patient's EMR and Pharmacy list, and upon discussion with patient.  Admitted to the hospital on 01/11/2021 due to Depression and Chest Pain. Discharge date was 01/11/2021. Discharged from [facility]  Patient Eloped.  New?Medications Started at [facility]  Discharge:??None  Medication Changes at Hospital Discharge: None  Medications Discontinued at Hospital Discharge: None  Medications that remain the same after Hospital Discharge:??  -All other medications will remain the same.    Medication Reconciliation was completed by comparing discharge summary, patient's EMR and Pharmacy list, and upon discussion with patient. Patient left [facility] after Triage  Admitted to the hospital on 01/10/2021 due to Chest Pain. Discharge date was 01/09/2021. Discharged from [facility].   Patient left [facility] after Triage  New?Medications Started at [facility] Discharge:??None  Medication Changes at Hospital Discharge: None  Medications Discontinued at Hospital Discharge: None  Medications that remain the same after Hospital Discharge:??  -All other medications will remain the same.    Medication Reconciliation was completed by comparing discharge summary, patient's EMR and Pharmacy list, and upon discussion with patient.  Admitted to the hospital on 01/04/2021 due to Chest Pain. Discharge date was 01/08/2021. Discharged from  [place]?Medications Started at [facility] Discharge:?None  Medication Changes at Hospital Discharge: None  Medications Discontinued at Hospital Discharge: -Stopped Benicar HCT, Revatio, Ultram due to Chest Pain  Medications that remain the same after Hospital Discharge:??  -All other medications will remain the same.    Medication Reconciliation was completed by comparing discharge summary, patient's EMR and Pharmacy list, and upon discussion with patient.  Admitted to the hospital on 11/11/2020 due to Frequent falls and unsteady gait. Discharge date was 11/11/2020. Discharged from [place]?Medications Started at [facility] Discharge:??None  Medication Changes at Hospital Discharge: None  Medications Discontinued at Hospital Discharge: None  Medications that  remain the same after Hospital Discharge:??  -All other medications will remain the same.    Medication Reconciliation was completed by comparing discharge summary, patient's EMR and Pharmacy list, and upon discussion with patient.  Admitted to the hospital on 10/25/2020 due to Hypertension urgency, malignant. Discharge date was 10/30/2020. Discharged from [facility].  Patient left against Medical Advice.  New?Medications Started at [facility] Discharge:??None  Medication Changes at Hospital Discharge: None  Medications Discontinued at Hospital Discharge: None  Medications that remain the same after Hospital Discharge:??  -All other medications will remain the same.    Medication Reconciliation was completed by comparing discharge summary, patient's EMR and Pharmacy list, and upon discussion with patient.  Admitted to the hospital on 10/07/2020 due to Dizziness. Discharge date was 10/07/2020. Discharged from [facility]  Patient Eloped.  New?Medications Started at [facility] Discharge:?? None  Medication Changes at Hospital Discharge: None  Medications Discontinued at Hospital Discharge: None  Medications that remain the same after Hospital Discharge:??  -All other medications will remain the same.    Medication Reconciliation was completed by comparing discharge summary, patient's EMR and Pharmacy list, and upon discussion with patient.  Admitted to the hospital on 10/03/2020 due to Depression, unspecified depression type and anxiety. Discharge date was 10/03/2020. Discharged from [place]?Medications Started at [facility] Discharge:??None  Medication Changes at Hospital Discharge: None  Medications Discontinued at Hospital Discharge: None  Medications that remain the same after Hospital Discharge:??  -All other medications will remain the same.    Medication Reconciliation was completed by comparing discharge summary,  patient's EMR and Pharmacy list, and upon discussion with patient.  Admitted to the hospital on 09/29/2020 due to Dizziness. Discharge date was 09/29/2020. Discharged from [facility].  Patient Eloped.  New?Medications Started at [facility] Discharge:??None  Medication Changes at Hospital Discharge: None  Medications Discontinued at Hospital Discharge: None  Medications that remain the same after Hospital Discharge:??  -All other medications will remain the same.    Medication Reconciliation was completed by comparing discharge summary, patient's EMR and Pharmacy list, and upon discussion with patient.  Admitted to the hospital on 09/15/20 due to Acute Renal Failure superimposed on stage 3a chronic kidney disease. Discharge date was 09/17/2020. Discharged from [place]?Medications Started at [facility] Discharge:??None  Medication Changes at Hospital Discharge: None  Medications Discontinued at Hospital Discharge: -Stopped Benadryl, Revatio due to  Acute Renal Failure superimposed on stage 3a chronic kidney disease  Medications that remain the same after Hospital Discharge:??  -All other medications will remain the same.    Medication Reconciliation was completed by comparing discharge summary, patient's EMR and Pharmacy list, and upon discussion with patient.  Admitted to the hospital on 09/03/2020 due to loser back pain without sciatica, unspecified back pain laterality, unspecified chronicity. Discharge date was 09/03/2020. Discharged from [place]?Medications Started at [facility] Discharge:?None  Medication Changes at Hospital Discharge: None  Medications Discontinued at Hospital Discharge: None  Medications that remain the same after Hospital Discharge:??  -All other medications will remain the same.    Medication Reconciliation was completed by comparing discharge summary, patient's EMR and Pharmacy  list, and upon discussion with patient.   Medications: Outpatient Encounter Medications as of 02/22/2021  Medication Sig   acetaminophen (TYLENOL) 325 MG tablet Take 2 tablets (650 mg total) by mouth every 6 (six) hours as needed for mild pain (or Fever >/= 101).   alfuzosin (UROXATRAL) 10 MG 24 hr tablet Take 1 tablet (10 mg total) by mouth daily with breakfast.   amLODipine (NORVASC) 5 MG tablet Take 1 tablet (5 mg total) by mouth daily.   ARIPiprazole (ABILIFY) 2 MG tablet TAKE 1 TABLET BY MOUTH EVERY DAY (Patient taking differently: Take 2 mg by mouth daily.)   aspirin EC 81 MG tablet Take 81 mg by mouth daily with breakfast.    atorvastatin (LIPITOR) 40 MG tablet 1 tablet by mouth Monday - Friday (Patient taking differently: Take 40 mg by mouth daily.)   clotrimazole-betamethasone (LOTRISONE) cream Apply 1 application topically 2 (two) times daily.   diclofenac Sodium (VOLTAREN) 1 % GEL Apply 4 g topically 4 (four) times daily.   empagliflozin (JARDIANCE) 10 MG TABS tablet Take 10 mg by mouth daily.   finasteride (PROSCAR) 5 MG tablet Take 1 tablet (5 mg total) by mouth daily.   metoprolol tartrate (LOPRESSOR) 50 MG tablet Take 1 tablet (50 mg total) by mouth 2 (two) times daily. (Patient not taking: No sig reported)   Multiple Vitamin (MULTIVITAMIN WITH MINERALS) TABS tablet Take 1 tablet by mouth daily.   nystatin (NYSTATIN) powder Apply 1 application topically 3 (three) times daily.   pantoprazole (PROTONIX) 40 MG tablet Take 1 tablet (40 mg total) by mouth 2 (two) times daily.   Semaglutide (RYBELSUS) 14 MG TABS Take 1 tablet by mouth daily. Take 30 minutes before breakfast   Tetrahydrozoline HCl (VISINE OP) Place 1 drop into both eyes daily as needed (itching/irritation).   traZODone (DESYREL) 50 MG tablet TAKE 1 TABLET (50 MG TOTAL) BY MOUTH AT BEDTIME AS NEEDED FOR SLEEP.   No facility-administered encounter medications on file as of 02/22/2021.    Recent Office Vitals: BP  Readings  from Last 3 Encounters:  02/09/21 (!) 139/102  02/01/21 103/80  01/31/21 (!) 166/112   Pulse Readings from Last 3 Encounters:  02/09/21 84  02/01/21 69  01/31/21 (!) 123    Wt Readings from Last 3 Encounters:  02/09/21 177 lb (80.3 kg)  01/11/21 175 lb (79.4 kg)  01/04/21 175 lb (79.4 kg)     Kidney Function Lab Results  Component Value Date/Time   CREATININE 1.68 (H) 02/09/2021 02:07 PM   CREATININE 1.71 (H) 01/31/2021 02:44 PM   GFRNONAA 44 (L) 02/09/2021 02:07 PM   GFRAA 48 (L) 03/13/2020 11:39 AM    BMP Latest Ref Rng & Units 02/09/2021 01/31/2021 01/06/2021  Glucose 70 - 99 mg/dL 273(H) 295(H) 124(H)  BUN 8 - 23 mg/dL 31(H) 35(H) 30(H)  Creatinine 0.61 - 1.24 mg/dL 1.68(H) 1.71(H) 1.52(H)  BUN/Creat Ratio 10 - 24 - - -  Sodium 135 - 145 mmol/L 136 136 137  Potassium 3.5 - 5.1 mmol/L 4.4 4.2 4.7  Chloride 98 - 111 mmol/L 100 100 106  CO2 22 - 32 mmol/L 23 25 21(L)  Calcium 8.9 - 10.3 mg/dL 9.7 9.8 9.5     Current antihypertensive regimen:  Amlodipine 5mg  1 tablet by month daily  Patient verbally confirms he is taking the above medications as directed. Yes  How often are you checking your Blood Pressure? daily  he checks his blood pressure in the morning before taking his medication.  Current home BP readings:   DATE:             BP               PULSE 02/24/21                     126/88                68 02/25/21                      132/82               72 02/26/21                       136/74              74   Wrist or arm cuff: arm cuff Caffeine intake: 4 cups  Salt intake: none OTC medications including pseudoephedrine or NSAIDs? no  Any readings above 180/120? No If yes any symptoms of hypertensive emergency? chest pain and shortness of breath   What recent interventions/DTPs have been made by any provider to improve Blood Pressure control since last CPP Visit: Patient is decreasing sodium intake and increase exercise  Any recent  hospitalizations or ED visits since last visit with CPP? Yes  What diet changes have been made to improve Blood Pressure Control?  Patient is decreasing sodium intake by not adding salt to food.  What exercise is being done to improve your Blood Pressure Control?  Patient is walling 30 minutes a day.  Adherence Review: Is the patient currently on ACE/ARB medication? No Does the patient have >5 day gap between last estimated fill dates? no  Care Gaps  AWV 01/03/2021 Foot exam 5/2/202022 Eye exam- overdue  Star Rating Drugs:  Atorvastastin 40 mg  filled 02/09/2021 [facility] Jardiance 10 mg filled 02/09/2021 [facility]  Cherlyn Labella Clinical Pharmacist Assistant [phone]

## 2021-02-24 ENCOUNTER — Other Ambulatory Visit: Payer: Self-pay | Admitting: Nurse Practitioner

## 2021-02-28 ENCOUNTER — Emergency Department (HOSPITAL_COMMUNITY)
Admission: EM | Admit: 2021-02-28 | Discharge: 2021-02-28 | Disposition: A | Discharge status: Home / Self Care | Payer: Medicare Other | Attending: Emergency Medicine | Admitting: Emergency Medicine

## 2021-02-28 ENCOUNTER — Encounter (HOSPITAL_COMMUNITY): Payer: Self-pay

## 2021-02-28 DIAGNOSIS — R300 Dysuria: Secondary | ICD-10-CM | POA: Diagnosis not present

## 2021-02-28 DIAGNOSIS — R109 Unspecified abdominal pain: Secondary | ICD-10-CM | POA: Insufficient documentation

## 2021-02-28 DIAGNOSIS — Z5321 Procedure and treatment not carried out due to patient leaving prior to being seen by health care provider: Secondary | ICD-10-CM | POA: Insufficient documentation

## 2021-02-28 LAB — COMPREHENSIVE METABOLIC PANEL
ALT: 51 U/L — ABNORMAL HIGH (ref 0–44)
AST: 29 U/L (ref 15–41)
Albumin: 3.9 g/dL (ref 3.5–5.0)
Alkaline Phosphatase: 117 U/L (ref 38–126)
Anion gap: 11 (ref 5–15)
BUN: 34 mg/dL — ABNORMAL HIGH (ref 8–23)
CO2: 20 mmol/L — ABNORMAL LOW (ref 22–32)
Calcium: 9.3 mg/dL (ref 8.9–10.3)
Chloride: 100 mmol/L (ref 98–111)
Creatinine, Ser: 1.7 mg/dL — ABNORMAL HIGH (ref 0.61–1.24)
GFR, Estimated: 44 mL/min — ABNORMAL LOW (ref >60)
Glucose, Bld: 404 mg/dL — ABNORMAL HIGH (ref 70–99)
Potassium: 4.6 mmol/L (ref 3.5–5.1)
Sodium: 131 mmol/L — ABNORMAL LOW (ref 135–145)
Total Bilirubin: 0.8 mg/dL (ref 0.3–1.2)
Total Protein: 7.6 g/dL (ref 6.5–8.1)

## 2021-02-28 LAB — CBC WITH DIFFERENTIAL/PLATELET
Abs Immature Granulocytes: 0.05 10*3/uL (ref 0.00–0.07)
Basophils Absolute: 0 10*3/uL (ref 0.0–0.1)
Basophils Relative: 0 %
Eosinophils Absolute: 0 10*3/uL (ref 0.0–0.5)
Eosinophils Relative: 0 %
HCT: 56.1 % — ABNORMAL HIGH (ref 39.0–52.0)
Hemoglobin: 18.8 g/dL — ABNORMAL HIGH (ref 13.0–17.0)
Immature Granulocytes: 1 %
Lymphocytes Relative: 6 %
Lymphs Abs: 0.6 10*3/uL — ABNORMAL LOW (ref 0.7–4.0)
MCH: 29.1 pg (ref 26.0–34.0)
MCHC: 33.5 g/dL (ref 30.0–36.0)
MCV: 86.7 fL (ref 80.0–100.0)
Monocytes Absolute: 0.5 10*3/uL (ref 0.1–1.0)
Monocytes Relative: 5 %
Neutro Abs: 8.2 10*3/uL — ABNORMAL HIGH (ref 1.7–7.7)
Neutrophils Relative %: 88 %
Platelets: 196 10*3/uL (ref 150–400)
RBC: 6.47 MIL/uL — ABNORMAL HIGH (ref 4.22–5.81)
RDW: 12.7 % (ref 11.5–15.5)
WBC: 9.3 10*3/uL (ref 4.0–10.5)
nRBC: 0 % (ref 0.0–0.2)

## 2021-02-28 LAB — LIPASE, BLOOD: Lipase: 41 U/L (ref 11–51)

## 2021-02-28 NOTE — ED Notes (Signed)
Pt turned in his patient labels and stated he was leaving

## 2021-02-28 NOTE — ED Provider Notes (Signed)
Emergency Medicine Provider Triage Evaluation Note  [name] , a 65 y.o. male  was evaluated in triage.  Pt complains of flank pain.  Review of Systems  Positive: Abd pain, R flank pain, urinary discomfort Negative: Fever, n/v/d, hematuria  Physical Exam  BP (!) 167/104 (BP Location: Left Arm)    Pulse 98    Temp 98 F (36.7 C) (Oral)    Resp 16    SpO2 100%  Gen:   Awake, no distress   Resp:  Normal effort  MSK:   Moves extremities without difficulty  Other:    Medical Decision Making  Medically screening exam initiated at 10:07 AM.  Appropriate orders placed.  VOLLIE BRUNTY was informed that the remainder of the evaluation will be completed by another provider, this initial triage assessment does not replace that evaluation, and the importance of remaining in the ED until their evaluation is complete.  Recurrent R flank and R side abd pain for more than a month.  Some urinary discomfort as well.  Report feeling dehydrated.    Domenic Moras, PA-C 02/28/21 1008    [name], MD 03/02/21 [phone]

## 2021-02-28 NOTE — ED Notes (Signed)
Pt. Attempted to give a urine sample and could not produce any urine at this time.

## 2021-02-28 NOTE — ED Triage Notes (Signed)
Pt presents with c/o right flank pain. Pt reports hx of kidney stones and surgery for said stones. Pt reports the pain is always present but it has been worse the past couple of days.

## 2021-03-08 ENCOUNTER — Other Ambulatory Visit: Payer: Self-pay | Admitting: Nurse Practitioner

## 2021-03-08 DIAGNOSIS — G4700 Insomnia, unspecified: Secondary | ICD-10-CM

## 2021-03-08 MED ORDER — TRAZODONE HCL 50 MG PO TABS: 50.0000 mg | ORAL_TABLET | Freq: Every evening | ORAL | 2 refills | Status: DC | PRN

## 2021-03-08 NOTE — Telephone Encounter (Signed)
I will refill this time, he has been discharged from the practice since 12/7.  I will provide a 30 day supply. We are available to him for 30 days after discharge.

## 2021-03-13 NOTE — Telephone Encounter (Signed)
Yes because he will only be seen for emergencies

## 2021-03-20 ENCOUNTER — Ambulatory Visit: Payer: Medicare Other | Admitting: Nurse Practitioner

## 2021-03-24 ENCOUNTER — Emergency Department (HOSPITAL_COMMUNITY): Payer: Medicare Other

## 2021-03-24 ENCOUNTER — Encounter (HOSPITAL_COMMUNITY): Payer: Self-pay | Admitting: Emergency Medicine

## 2021-03-24 ENCOUNTER — Inpatient Hospital Stay (HOSPITAL_COMMUNITY)
Admission: EM | Admit: 2021-03-24 | Discharge: 2021-03-27 | DRG: 177 | Disposition: A | Discharge status: Home Health | Payer: Medicare Other | Attending: Internal Medicine | Admitting: Internal Medicine

## 2021-03-24 DIAGNOSIS — N179 Acute kidney failure, unspecified: Secondary | ICD-10-CM | POA: Diagnosis present

## 2021-03-24 DIAGNOSIS — R739 Hyperglycemia, unspecified: Secondary | ICD-10-CM | POA: Diagnosis not present

## 2021-03-24 DIAGNOSIS — J189 Pneumonia, unspecified organism: Secondary | ICD-10-CM

## 2021-03-24 DIAGNOSIS — Z9114 Patient's other noncompliance with medication regimen: Secondary | ICD-10-CM | POA: Diagnosis not present

## 2021-03-24 DIAGNOSIS — R059 Cough, unspecified: Secondary | ICD-10-CM | POA: Diagnosis not present

## 2021-03-24 DIAGNOSIS — E1122 Type 2 diabetes mellitus with diabetic chronic kidney disease: Secondary | ICD-10-CM | POA: Diagnosis present

## 2021-03-24 DIAGNOSIS — N138 Other obstructive and reflux uropathy: Secondary | ICD-10-CM | POA: Diagnosis not present

## 2021-03-24 DIAGNOSIS — Z91128 Patient's intentional underdosing of medication regimen for other reason: Secondary | ICD-10-CM

## 2021-03-24 DIAGNOSIS — G4700 Insomnia, unspecified: Secondary | ICD-10-CM | POA: Diagnosis present

## 2021-03-24 DIAGNOSIS — J159 Unspecified bacterial pneumonia: Secondary | ICD-10-CM | POA: Diagnosis present

## 2021-03-24 DIAGNOSIS — M109 Gout, unspecified: Secondary | ICD-10-CM | POA: Diagnosis present

## 2021-03-24 DIAGNOSIS — Z888 Allergy status to other drugs, medicaments and biological substances status: Secondary | ICD-10-CM

## 2021-03-24 DIAGNOSIS — Z8249 Family history of ischemic heart disease and other diseases of the circulatory system: Secondary | ICD-10-CM

## 2021-03-24 DIAGNOSIS — D751 Secondary polycythemia: Secondary | ICD-10-CM | POA: Diagnosis present

## 2021-03-24 DIAGNOSIS — K219 Gastro-esophageal reflux disease without esophagitis: Secondary | ICD-10-CM | POA: Diagnosis not present

## 2021-03-24 DIAGNOSIS — N1832 Chronic kidney disease, stage 3b: Secondary | ICD-10-CM | POA: Diagnosis present

## 2021-03-24 DIAGNOSIS — N401 Enlarged prostate with lower urinary tract symptoms: Secondary | ICD-10-CM | POA: Diagnosis present

## 2021-03-24 DIAGNOSIS — R509 Fever, unspecified: Secondary | ICD-10-CM | POA: Diagnosis not present

## 2021-03-24 DIAGNOSIS — E785 Hyperlipidemia, unspecified: Secondary | ICD-10-CM | POA: Diagnosis not present

## 2021-03-24 DIAGNOSIS — U071 COVID-19: Secondary | ICD-10-CM | POA: Diagnosis not present

## 2021-03-24 DIAGNOSIS — Z7982 Long term (current) use of aspirin: Secondary | ICD-10-CM

## 2021-03-24 DIAGNOSIS — E1165 Type 2 diabetes mellitus with hyperglycemia: Secondary | ICD-10-CM | POA: Diagnosis not present

## 2021-03-24 DIAGNOSIS — N1831 Chronic kidney disease, stage 3a: Secondary | ICD-10-CM | POA: Diagnosis not present

## 2021-03-24 DIAGNOSIS — E1169 Type 2 diabetes mellitus with other specified complication: Secondary | ICD-10-CM | POA: Diagnosis present

## 2021-03-24 DIAGNOSIS — E871 Hypo-osmolality and hyponatremia: Secondary | ICD-10-CM | POA: Diagnosis not present

## 2021-03-24 DIAGNOSIS — R0689 Other abnormalities of breathing: Secondary | ICD-10-CM | POA: Diagnosis not present

## 2021-03-24 DIAGNOSIS — Z91199 Patient's noncompliance with other medical treatment and regimen due to unspecified reason: Secondary | ICD-10-CM | POA: Diagnosis not present

## 2021-03-24 DIAGNOSIS — F331 Major depressive disorder, recurrent, moderate: Secondary | ICD-10-CM

## 2021-03-24 DIAGNOSIS — I10 Essential (primary) hypertension: Secondary | ICD-10-CM | POA: Diagnosis not present

## 2021-03-24 DIAGNOSIS — F32A Depression, unspecified: Secondary | ICD-10-CM | POA: Diagnosis not present

## 2021-03-24 DIAGNOSIS — T383X6A Underdosing of insulin and oral hypoglycemic [antidiabetic] drugs, initial encounter: Secondary | ICD-10-CM | POA: Diagnosis present

## 2021-03-24 DIAGNOSIS — E860 Dehydration: Secondary | ICD-10-CM | POA: Diagnosis not present

## 2021-03-24 DIAGNOSIS — R0602 Shortness of breath: Secondary | ICD-10-CM

## 2021-03-24 DIAGNOSIS — H3530 Unspecified macular degeneration: Secondary | ICD-10-CM | POA: Diagnosis present

## 2021-03-24 DIAGNOSIS — E1121 Type 2 diabetes mellitus with diabetic nephropathy: Secondary | ICD-10-CM

## 2021-03-24 DIAGNOSIS — R4586 Emotional lability: Secondary | ICD-10-CM

## 2021-03-24 DIAGNOSIS — Z743 Need for continuous supervision: Secondary | ICD-10-CM | POA: Diagnosis not present

## 2021-03-24 DIAGNOSIS — C61 Malignant neoplasm of prostate: Secondary | ICD-10-CM

## 2021-03-24 DIAGNOSIS — Z79899 Other long term (current) drug therapy: Secondary | ICD-10-CM | POA: Diagnosis not present

## 2021-03-24 DIAGNOSIS — Z8546 Personal history of malignant neoplasm of prostate: Secondary | ICD-10-CM

## 2021-03-24 DIAGNOSIS — R531 Weakness: Secondary | ICD-10-CM | POA: Diagnosis not present

## 2021-03-24 DIAGNOSIS — Z833 Family history of diabetes mellitus: Secondary | ICD-10-CM

## 2021-03-24 DIAGNOSIS — I129 Hypertensive chronic kidney disease with stage 1 through stage 4 chronic kidney disease, or unspecified chronic kidney disease: Secondary | ICD-10-CM | POA: Diagnosis not present

## 2021-03-24 DIAGNOSIS — R5383 Other fatigue: Secondary | ICD-10-CM | POA: Diagnosis not present

## 2021-03-24 HISTORY — DX: Pneumonia, unspecified organism: J18.9

## 2021-03-24 LAB — CBC
HCT: 51.5 % (ref 39.0–52.0)
Hemoglobin: 17.3 g/dL — ABNORMAL HIGH (ref 13.0–17.0)
MCH: 29.4 pg (ref 26.0–34.0)
MCHC: 33.6 g/dL (ref 30.0–36.0)
MCV: 87.4 fL (ref 80.0–100.0)
Platelets: 137 10*3/uL — ABNORMAL LOW (ref 150–400)
RBC: 5.89 MIL/uL — ABNORMAL HIGH (ref 4.22–5.81)
RDW: 12.6 % (ref 11.5–15.5)
WBC: 6.3 10*3/uL (ref 4.0–10.5)
nRBC: 0 % (ref 0.0–0.2)

## 2021-03-24 LAB — RESPIRATORY PANEL BY PCR

## 2021-03-24 LAB — URINALYSIS, MICROSCOPIC (REFLEX)
Bacteria, UA: NONE SEEN
Squamous Epithelial / HPF: NONE SEEN (ref 0–5)

## 2021-03-24 LAB — CBG MONITORING, ED
Glucose-Capillary: 505 mg/dL (ref 70–99)
Glucose-Capillary: 292 mg/dL — ABNORMAL HIGH (ref 70–99)

## 2021-03-24 LAB — URINALYSIS, ROUTINE W REFLEX MICROSCOPIC
Bilirubin Urine: NEGATIVE
Glucose, UA: >=500 mg/dL — AB
Ketones, ur: NEGATIVE mg/dL
Leukocytes,Ua: NEGATIVE
Nitrite: NEGATIVE
Protein, ur: 30 mg/dL — AB
Specific Gravity, Urine: 1.01 (ref 1.005–1.030)
pH: 5.5 (ref 5.0–8.0)

## 2021-03-24 LAB — RESP PANEL BY RT-PCR (FLU A&B, COVID) ARPGX2
Influenza A by PCR: NEGATIVE
Influenza B by PCR: NEGATIVE
SARS Coronavirus 2 by RT PCR: POSITIVE — AB

## 2021-03-24 LAB — BASIC METABOLIC PANEL
Anion gap: 10 (ref 5–15)
BUN: 42 mg/dL — ABNORMAL HIGH (ref 8–23)
CO2: 23 mmol/L (ref 22–32)
Calcium: 8.8 mg/dL — ABNORMAL LOW (ref 8.9–10.3)
Chloride: 96 mmol/L — ABNORMAL LOW (ref 98–111)
Creatinine, Ser: 2.05 mg/dL — ABNORMAL HIGH (ref 0.61–1.24)
GFR, Estimated: 35 mL/min — ABNORMAL LOW (ref >60)
Glucose, Bld: 531 mg/dL (ref 70–99)
Potassium: 4.7 mmol/L (ref 3.5–5.1)
Sodium: 129 mmol/L — ABNORMAL LOW (ref 135–145)

## 2021-03-24 LAB — PROCALCITONIN: Procalcitonin: 0.54 ng/mL

## 2021-03-24 MED ORDER — INSULIN DETEMIR 100 UNIT/ML ~~LOC~~ SOLN
8.0000 [IU] | Freq: Every day | SUBCUTANEOUS | Status: DC
  Administered 2021-03-24 – 2021-03-27 (×4): 8 [IU] via SUBCUTANEOUS
  Filled 2021-03-24 (×4): qty 0.08

## 2021-03-24 MED ORDER — ARIPIPRAZOLE 2 MG PO TABS
2.0000 mg | ORAL_TABLET | Freq: Every day | ORAL | Status: DC
  Filled 2021-03-24: qty 1

## 2021-03-24 MED ORDER — ACETAMINOPHEN 325 MG PO TABS
650.0000 mg | ORAL_TABLET | Freq: Four times a day (QID) | ORAL | Status: DC | PRN
  Administered 2021-03-26: 650 mg via ORAL
  Filled 2021-03-24: qty 2

## 2021-03-24 MED ORDER — ADULT MULTIVITAMIN W/MINERALS CH
1.0000 | ORAL_TABLET | Freq: Every day | ORAL | Status: DC
  Administered 2021-03-24 – 2021-03-27 (×4): 1 via ORAL
  Filled 2021-03-24 (×4): qty 1

## 2021-03-24 MED ORDER — ZINC SULFATE 220 (50 ZN) MG PO CAPS
220.0000 mg | ORAL_CAPSULE | Freq: Every day | ORAL | Status: DC
  Administered 2021-03-24 – 2021-03-27 (×4): 220 mg via ORAL
  Filled 2021-03-24 (×4): qty 1

## 2021-03-24 MED ORDER — SODIUM CHLORIDE 0.9 % IV SOLN
1.0000 g | Freq: Once | INTRAVENOUS | Status: AC
  Administered 2021-03-24: 1 g via INTRAVENOUS
  Filled 2021-03-24: qty 10

## 2021-03-24 MED ORDER — ACETAMINOPHEN 325 MG PO TABS
650.0000 mg | ORAL_TABLET | Freq: Once | ORAL | Status: AC
  Administered 2021-03-24: 650 mg via ORAL
  Filled 2021-03-24: qty 2

## 2021-03-24 MED ORDER — NIRMATRELVIR/RITONAVIR (PAXLOVID) TABLET (RENAL DOSING)
2.0000 | ORAL_TABLET | Freq: Two times a day (BID) | ORAL | Status: DC
  Administered 2021-03-25 – 2021-03-27 (×5): 2 via ORAL
  Filled 2021-03-24 (×2): qty 20

## 2021-03-24 MED ORDER — SODIUM CHLORIDE 0.9 % IV SOLN
500.0000 mg | INTRAVENOUS | Status: DC
  Administered 2021-03-25: 500 mg via INTRAVENOUS
  Filled 2021-03-24 (×3): qty 5

## 2021-03-24 MED ORDER — SODIUM CHLORIDE 0.9 % IV SOLN
1.0000 g | INTRAVENOUS | Status: DC
  Administered 2021-03-25: 1 g via INTRAVENOUS
  Filled 2021-03-24: qty 10

## 2021-03-24 MED ORDER — ASPIRIN EC 81 MG PO TBEC
81.0000 mg | DELAYED_RELEASE_TABLET | Freq: Every day | ORAL | Status: DC
  Administered 2021-03-25 – 2021-03-27 (×3): 81 mg via ORAL
  Filled 2021-03-24 (×3): qty 1

## 2021-03-24 MED ORDER — ACETAMINOPHEN 650 MG RE SUPP: 650.0000 mg | Freq: Four times a day (QID) | RECTAL | Status: DC | PRN

## 2021-03-24 MED ORDER — SODIUM CHLORIDE 0.9 % IV SOLN: INTRAVENOUS | Status: DC

## 2021-03-24 MED ORDER — INSULIN ASPART 100 UNIT/ML IJ SOLN
8.0000 [IU] | Freq: Once | INTRAMUSCULAR | Status: AC
  Administered 2021-03-24: 8 [IU] via INTRAVENOUS

## 2021-03-24 MED ORDER — TRAZODONE HCL 50 MG PO TABS: 50.0000 mg | ORAL_TABLET | Freq: Every evening | ORAL | Status: DC | PRN

## 2021-03-24 MED ORDER — ONDANSETRON HCL 4 MG/2ML IJ SOLN: 4.0000 mg | Freq: Four times a day (QID) | INTRAMUSCULAR | Status: DC | PRN

## 2021-03-24 MED ORDER — SODIUM CHLORIDE 0.9 % IV SOLN
500.0000 mg | Freq: Once | INTRAVENOUS | Status: AC
  Administered 2021-03-24: 500 mg via INTRAVENOUS
  Filled 2021-03-24: qty 5

## 2021-03-24 MED ORDER — ENOXAPARIN SODIUM 30 MG/0.3ML IJ SOSY
30.0000 mg | PREFILLED_SYRINGE | INTRAMUSCULAR | Status: DC
  Administered 2021-03-24 – 2021-03-25 (×2): 30 mg via SUBCUTANEOUS
  Filled 2021-03-24 (×2): qty 0.3

## 2021-03-24 MED ORDER — ONDANSETRON HCL 4 MG PO TABS: 4.0000 mg | ORAL_TABLET | Freq: Four times a day (QID) | ORAL | Status: DC | PRN

## 2021-03-24 MED ORDER — INSULIN ASPART 100 UNIT/ML IJ SOLN
0.0000 [IU] | Freq: Three times a day (TID) | INTRAMUSCULAR | Status: DC
  Administered 2021-03-24: 5 [IU] via SUBCUTANEOUS
  Administered 2021-03-25: 2 [IU] via SUBCUTANEOUS
  Administered 2021-03-25: 1 [IU] via SUBCUTANEOUS
  Administered 2021-03-25: 5 [IU] via SUBCUTANEOUS
  Administered 2021-03-26: 2 [IU] via SUBCUTANEOUS
  Administered 2021-03-26: 5 [IU] via SUBCUTANEOUS
  Administered 2021-03-26: 1 [IU] via SUBCUTANEOUS
  Administered 2021-03-27: 2 [IU] via SUBCUTANEOUS

## 2021-03-24 MED ORDER — ASCORBIC ACID 500 MG PO TABS
500.0000 mg | ORAL_TABLET | Freq: Every day | ORAL | Status: DC
  Administered 2021-03-24 – 2021-03-27 (×4): 500 mg via ORAL
  Filled 2021-03-24 (×4): qty 1

## 2021-03-24 MED ORDER — GUAIFENESIN-DM 100-10 MG/5ML PO SYRP: 5.0000 mL | ORAL_SOLUTION | ORAL | Status: DC | PRN

## 2021-03-24 MED ORDER — SODIUM CHLORIDE 0.9 % IV BOLUS
1000.0000 mL | Freq: Once | INTRAVENOUS | Status: AC
  Administered 2021-03-24: 1000 mL via INTRAVENOUS

## 2021-03-24 MED ORDER — ALBUTEROL SULFATE HFA 108 (90 BASE) MCG/ACT IN AERS
2.0000 | INHALATION_SPRAY | Freq: Four times a day (QID) | RESPIRATORY_TRACT | Status: DC
  Administered 2021-03-24 – 2021-03-27 (×10): 2 via RESPIRATORY_TRACT
  Filled 2021-03-24 (×4): qty 6.7

## 2021-03-24 NOTE — Assessment & Plan Note (Addendum)
66 year old male presenting with 2 day history of not feeling well found to have hyperglycemia and CXR concerning for pneumonia -admit to medical telemetry -This appears to be most likely community-acquired pneumonia vs. Viral etiology  -Respiratory virus panel ordered to include pertussis/covid and flu still pending>covid +. Will continue abx until procalcitonin and start covid tx below.  -procalcitonin. -strep urinary antigen  -sputum culture -blood cultures -continue rocephin and azithromycin. -IVF and robitussin DM prn

## 2021-03-24 NOTE — Assessment & Plan Note (Addendum)
Noncompliant with medication. No complaints of retention

## 2021-03-24 NOTE — Assessment & Plan Note (Signed)
Continue ASA

## 2021-03-24 NOTE — ED Triage Notes (Signed)
Pt arrives via EMS from [place] with CBG 439. States he feels weak and nauseous. Reports a possible covid exposure. States he has been out of insulin for a week. 20G LFA and 500 cc NS given.

## 2021-03-24 NOTE — Assessment & Plan Note (Signed)
Social work consult to address barriers

## 2021-03-24 NOTE — Assessment & Plan Note (Addendum)
Hold trazodone while on paxlovid.

## 2021-03-24 NOTE — ED Provider Triage Note (Signed)
Emergency Medicine Provider Triage Evaluation Note  [name] , a 66 y.o. male  was evaluated in triage.  Pt complains of hyperglycemia.  States that a week ago he ran out of his insulin and has been having generalized weakness and fatigue since then.  Also endorses generalized body aches without fevers or chills.  Also endorses nausea without vomiting.  Review of Systems  Positive:  Negative: See abov  Physical Exam  BP 140/90    Pulse 86    Temp 99.7 F (37.6 C) (Oral)    Resp 16    SpO2 98%  Gen:   Awake, no distress   Resp:  Normal effort  MSK:   Moves extremities without difficulty  Other:    Medical Decision Making  Medically screening exam initiated at 1:01 PM.  Appropriate orders placed.  [name] was informed that the remainder of the evaluation will be completed by another provider, this initial triage assessment does not replace that evaluation, and the importance of remaining in the ED until their evaluation is complete.  CBG 505   [name] 03/24/21 1302

## 2021-03-24 NOTE — Assessment & Plan Note (Signed)
Baseline appears to be around creatinine 1.5-1.8 Given 1L bolus in ED, continue IVF at 100cc/hour Avoid nephrotoxic drugs UA with no hematuria I/O Trend renal function

## 2021-03-24 NOTE — ED Provider Notes (Signed)
[facility] EMERGENCY DEPARTMENT Provider Note   CSN: [id number] Arrival date & time: 03/24/21  1231     History  Chief Complaint  Patient presents with   Hyperglycemia    [name] is a 66 y.o. male presenting for evaluation of feeling "not well" and high blood sugars.  Patient states for the past 2 days he has had generalized body aches, back pain, generalized weakness.  He states last night he stood up multiple times in his bed, and felt so weak he fell back onto his bed.  He denies any chest pain.  He does report a nonproductive cough.  No known fevers.  No nausea, vomiting, abdominal pain, abnormal bowel movements.  He reports urinary frequency, but no dysuria or hematuria. Per triage note, patient initially reported that he was out of his insulin for this past week, however he tells me that he has been taking it every day including today, and that he has plenty of insulin at home.  He reports recent COVID exposure, states his friend was COVID-positive and he was in the car with him recently.  HPI     Home Medications Prior to Admission medications   Medication Sig Start Date End Date Taking? Authorizing Provider  olmesartan-hydrochlorothiazide (BENICAR HCT) 40-25 MG tablet TAKE 1 TABLET BY MOUTH EVERY DAY 02/26/21   [name], [name]  acetaminophen (TYLENOL) 325 MG tablet Take 2 tablets (650 mg total) by mouth every 6 (six) hours as needed for mild pain (or Fever >/= 101). 02/12/20   Domenic Polite, MD  alfuzosin (UROXATRAL) 10 MG 24 hr tablet Take 1 tablet (10 mg total) by mouth daily with breakfast. 11/15/20   Hollice Espy, MD  amLODipine (NORVASC) 5 MG tablet Take 1 tablet (5 mg total) by mouth daily. 01/07/21   Regalado, Belkys A, MD  ARIPiprazole (ABILIFY) 2 MG tablet TAKE 1 TABLET BY MOUTH EVERY DAY Patient taking differently: Take 2 mg by mouth daily. 01/03/21   [name], [name]  aspirin EC 81 MG tablet Take 81 mg by mouth daily with  breakfast.     [provider]  atorvastatin (LIPITOR) 40 MG tablet 1 tablet by mouth Monday - Friday Patient taking differently: Take 40 mg by mouth daily. 12/06/20   [name], [name]  clotrimazole-betamethasone (LOTRISONE) cream Apply 1 application topically 2 (two) times daily. 07/17/20   [name], [name]  diclofenac Sodium (VOLTAREN) 1 % GEL Apply 4 g topically 4 (four) times daily. 09/03/20   Palumbo, April, MD  empagliflozin (JARDIANCE) 10 MG TABS tablet Take 10 mg by mouth daily.    [provider]  finasteride (PROSCAR) 5 MG tablet Take 1 tablet (5 mg total) by mouth daily. 12/12/20   Hollice Espy, MD  metoprolol tartrate (LOPRESSOR) 50 MG tablet Take 1 tablet (50 mg total) by mouth 2 (two) times daily. Patient not taking: No sig reported 12/06/20   [name], [name]  Multiple Vitamin (MULTIVITAMIN WITH MINERALS) TABS tablet Take 1 tablet by mouth daily.    [provider]  nystatin (NYSTATIN) powder Apply 1 application topically 3 (three) times daily. 08/26/19   [name], [name]  pantoprazole (PROTONIX) 40 MG tablet Take 1 tablet (40 mg total) by mouth 2 (two) times daily. 01/07/21   Regalado, Belkys A, MD  Semaglutide (RYBELSUS) 14 MG TABS Take 1 tablet by mouth daily. Take 30 minutes before breakfast 12/06/20   [name], [name]  Tetrahydrozoline HCl (VISINE OP) Place 1 drop into both  eyes daily as needed (itching/irritation).    [provider]  traZODone (DESYREL) 50 MG tablet Take 1 tablet (50 mg total) by mouth at bedtime as needed for sleep. 03/08/21   [name], [name]      Allergies    Haldol [haloperidol]    Review of Systems   Review of Systems  Respiratory:  Positive for cough.   Endocrine: Positive for polydipsia and polyuria.  Musculoskeletal:  Positive for back pain and myalgias.  Neurological:  Positive for weakness.  All other systems reviewed and are negative.  Physical Exam Updated Vital Signs BP (!) 118/100    Pulse 82     Temp 99.7 F (37.6 C) (Oral)    Resp 16    SpO2 92%  Physical Exam Vitals and nursing note reviewed.  Constitutional:      General: He is not in acute distress.    Appearance: Normal appearance.     Comments: Nontoxic  HENT:     Head: Normocephalic and atraumatic.  Eyes:     Conjunctiva/sclera: Conjunctivae normal.     Pupils: Pupils are equal, round, and reactive to light.  Cardiovascular:     Rate and Rhythm: Normal rate and regular rhythm.     Pulses: Normal pulses.  Pulmonary:     Effort: Pulmonary effort is normal. No respiratory distress.     Breath sounds: Normal breath sounds. No wheezing.     Comments: Speaking in full sentences.  Clear lung sounds in all fields. Dry cough noted on exam Abdominal:     General: There is no distension.     Palpations: Abdomen is soft. There is no mass.     Tenderness: There is no abdominal tenderness. There is no guarding or rebound.  Musculoskeletal:        General: Normal range of motion.     Cervical back: Normal range of motion and neck supple.     Right lower leg: No edema.     Left lower leg: No edema.  Skin:    General: Skin is warm and dry.     Capillary Refill: Capillary refill takes less than 2 seconds.  Neurological:     Mental Status: He is alert and oriented to person, place, and time.  Psychiatric:        Mood and Affect: Mood and affect normal.        Speech: Speech normal.        Behavior: Behavior normal.    ED Results / Procedures / Treatments   Labs (all labs ordered are listed, but only abnormal results are displayed) Labs Reviewed  BASIC METABOLIC PANEL - Abnormal; Notable for the following components:      Result Value   Sodium 129 (*)    Chloride 96 (*)    Glucose, Bld 531 (*)    BUN 42 (*)    Creatinine, Ser 2.05 (*)    Calcium 8.8 (*)    GFR, Estimated 35 (*)    All other components within normal limits  CBC - Abnormal; Notable for the following components:   RBC 5.89 (*)    Hemoglobin 17.3  (*)    Platelets 137 (*)    All other components within normal limits  URINALYSIS, ROUTINE W REFLEX MICROSCOPIC - Abnormal; Notable for the following components:   Glucose, UA >=500 (*)    Hgb urine dipstick SMALL (*)    Protein, ur 30 (*)    All other components within normal limits  CBG MONITORING, ED - Abnormal; Notable for the following components:   Glucose-Capillary 505 (*)    All other components within normal limits  RESP PANEL BY RT-PCR (FLU A&B, COVID) ARPGX2  RESPIRATORY PANEL BY PCR  URINALYSIS, MICROSCOPIC (REFLEX)    EKG EKG Interpretation  Date/Time:  Saturday March 24 2021 14:10:40 EST Ventricular Rate:  80 PR Interval:  136 QRS Duration: 73 QT Interval:  327 QTC Calculation: 378 R Axis:   -38 Text Interpretation: Sinus rhythm Inferior infarct, old No acute changes No significant change since last tracing Confirmed by Varney Biles [phone]) on 03/24/2021 2:35:25 PM  Radiology DG Chest 2 View  Result Date: 03/24/2021 CLINICAL DATA:  Cough for several days. EXAM: CHEST - 2 VIEW COMPARISON:  01/04/2021. FINDINGS: Cardiac silhouette is normal in size. Normal mediastinal and hilar contours. Subtle airspace opacity suggested in the suprahilar right upper lobe. Remainder of the lungs is clear. No pleural effusion or pneumothorax. Skeletal structures are intact. IMPRESSION: 1. Subtle opacity in the central right upper lobe, somewhat equivocal, but suspicious for pneumonia given the patient's symptoms. No other evidence of acute cardiopulmonary disease. Electronically Signed   By: Lajean Manes M.D.   On: 03/24/2021 14:08    Procedures Procedures    Medications Ordered in ED Medications  cefTRIAXone (ROCEPHIN) 1 g in sodium chloride 0.9 % 100 mL IVPB (1 g Intravenous New Bag/Given 03/24/21 1501)  azithromycin (ZITHROMAX) 500 mg in sodium chloride 0.9 % 250 mL IVPB (has no administration in time range)  sodium chloride 0.9 % bolus 1,000 mL (1,000 mLs Intravenous New  Bag/Given 03/24/21 1429)  insulin aspart (novoLOG) injection 8 Units (8 Units Intravenous Given 03/24/21 1502)  acetaminophen (TYLENOL) tablet 650 mg (650 mg Oral Given 03/24/21 1503)    ED Course/ Medical Decision Making/ A&P                           Medical Decision Making   This patient presents to the ED for concern of cough, weakness, hyperglycemia. This involves an extensive number of treatment options, and is a complaint that carries with it a high risk of complications and morbidity.  The differential diagnosis includes viral illness, bacterial illness, hyperglycemia, DKA, anemia, UTI, ACS, arrhythmia, dehydration  Co morbidities: Diabetes, hypertension, CKD, noncompliance   Additional history: Reviewed chart during which it is discussed pt's noncompliance and tendency to elope/leave ama.   Lab Tests:  I ordered, and personally interpreted labs.  The pertinent results include: Hyperglycemia.  However bicarb and gap are normal, no evidence of DKA.  Patient is not confused, doubt HHS.  Urine without ketones.  BUN and creatinine are elevated from baseline, there is likely component of dehydration.  Patient is hyponatremic, however when corrected for hyperglycemia, it normalizes.  No leukocytosis.   Imaging Studies:  I ordered imaging studies including chest x-ray I independently visualized and interpreted imaging which showed R sided pna.  I agree with the radiologist interpretation   Cardiac Monitoring:  The patient was maintained on a cardiac monitor.  I personally viewed and interpreted the cardiac monitored which showed an underlying rhythm of: nsr   Medicines ordered:  I ordered medication including fluids and insulin for hyperglycemia, abd for pna  Dispostion:  After consideration of the diagnostic results and the patients response to treatment, I feel that the patent would benefit from admission for management of his pneumonia and hyperglycemia.  Per patient's port  score, he is moderate  risk and hospitalization is recommended.  Due to his history of noncompliance, his elevated blood sugar and dehydration today, he would likely benefit from close monitoring of his blood sugar and treatment of his pneumonia in the hospital.  Discussed with Dr. Rogers Blocker from [facility] hospitalist service, patient to be admitted.   Final Clinical Impression(s) / ED Diagnoses Final diagnoses:  Pneumonia of right upper lobe due to infectious organism  Hyperglycemia  Dehydration    Rx / DC Orders ED Discharge Orders     None         Franchot Heidelberg, PA-C 03/24/21 [place], MD 03/25/21 1128

## 2021-03-24 NOTE — Assessment & Plan Note (Addendum)
Blood pressures have been acceptable Has not been taking any of his medication (novasc, benicar-hctz, metoprolol) Will hold meds as of now since BP have been decently controlled

## 2021-03-24 NOTE — Assessment & Plan Note (Addendum)
Has not been taking his Abilify. Will continue to hold this in setting of paxlovid. Hold x 5 days after tx course.

## 2021-03-24 NOTE — Assessment & Plan Note (Addendum)
Last a1c in 11/2020 was 8.4 Per outpatient medication on rybelsus and jardiance.  Pneumonia, poor diet and ? Compliance of medication likely driving high BS No evidence of DKA or HHS Given 8 units novolog in ED and 1L bolus Start IVF Starting long acting insulin at 8 units (.2mg /kg), continue with SSI Hold jardiance/rybelsus with AKI (has not been taking anyway)

## 2021-03-24 NOTE — Assessment & Plan Note (Signed)
Continue proscar and uroxatral

## 2021-03-24 NOTE — H&P (Addendum)
History and Physical    [name] ZOX:[id number] DOB: [birth year] DOA: 03/24/2021  PCP: [name], [name] Consultants:  nephrology: [facility]  Patient coming from:  Home - lives alone   Chief Complaint: weakness/fatigue, not feeling well.   HPI: [name] is a 66 y.o. male with medical history significant of T2DM, HTN, HLD, CKD stage III, hx of prostate cancer, recurrent urinary obstruction, depression who presented to ED with complaints of feeling "down and out" for several weeks. He also has been depressed with his girlfriend and has had some hiccups in their relationship.  He states the past 2 days he started to feel really weak and tired. He went to the store and thought he may fall due to his weakness. He then went to [facility] for breakfast and was so weak and so they called 911.   He states he has had a dry cough for the past few days as well. He reports fever to 101 and chills. He denies any congestion, ear pain or sore throat. He denies any shortness of breath. NO chest pain/palpitations, no abdominal pain, N/V/D, dysuria or leg swelling. He has not taken anything over the counter except tylenol. He was around his buddy that may have had covid about 3 days ago.   Covid vaccine x1. Has not had his flu shot.  He does not smoke, occasionally drinks a beer.   ED Course: vitals: temp: 99.4, blood pressure: 139/102, HR: 84, RR: 16, oxygen: 97% RA Pertinent labs: hgb: 17.3, sodium: 129, sugar: 531, creatinine: 2.05, BUN: 42, NO GAP,  CXR: subtle opacity in the central RUL, suspicious for PNA In ED: given 1L bolus, 8 units novolog, azithromycin and rocephin and [facility] was asked to admit.   Review of Systems: As per HPI; otherwise review of systems reviewed and negative.   Ambulatory Status:  Ambulates without assistance   Past Medical History:  Diagnosis Date   Acute kidney injury superimposed on chronic kidney disease ([place]) 02/10/2020   Acute on chronic kidney failure (HCC)  09/28/2019   Closed nondisplaced spiral fracture of shaft of right tibia    Complicated urinary tract infection 09/06/2016   Depression    Diabetes mellitus without complication (HCC)    GERD (gastroesophageal reflux disease)    Gout    right foot   Hemorrhoids    Hypertension    Hypertension    Macular degeneration    Microcytic anemia    Prostate cancer ([place]) 08/2009   Rectal bleeding 07/31/2016   Tubular adenoma     Past Surgical History:  Procedure Laterality Date   CYSTOSCOPY WITH RETROGRADE PYELOGRAM, URETEROSCOPY AND STENT PLACEMENT Right 07/31/2016   Procedure: CYSTOSCOPY WITH RIGHT  RETROGRADE PYELOGRAM, URETEROSCOPY;  Surgeon: [name], MD;  Location: [facility];  Service: Urology;  Laterality: Right;   INGUINAL HERNIA REPAIR     Left ankle joint fusion  1981   PROSTATE BIOPSY     x 2   URETERAL REIMPLANTION  07/31/2016   Procedure: URETERAL REIMPLANT, right boari flap, right psoas hitch;  Surgeon: [name], MD;  Location: [facility];  Service: Urology;;    Social History   Socioeconomic History   Marital status: Divorced    Spouse name: Not on file   Number of children: 0   Years of education: Not on file   Highest education level: Not on file  Occupational History   Occupation: retired  Tobacco Use   Smoking status: Never  Smokeless tobacco: Never  Vaping Use   Vaping Use: Never used  Substance and Sexual Activity   Alcohol use: Yes    Comment: ocassionally   Drug use: No   Sexual activity: Yes  Other Topics Concern   Not on file  Social History Narrative   Not on file   Social Determinants of Health   Financial Resource Strain: Not on file  Food Insecurity: Not on file  Transportation Needs: Not on file  Physical Activity: Not on file  Stress: Not on file  Social Connections: Not on file  Intimate Partner Violence: Not on file    Allergies  Allergen Reactions   Haldol [Haloperidol]     Per family cause life threatening  complications to patient    Family History  Problem Relation Age of Onset   Lung cancer Mother        Deceased   Throat cancer Brother    Pancreatic cancer Father    Heart disease Father        Deceased   Lung cancer Maternal Uncle        nephew   Diabetes Other     Prior to Admission medications   Medication Sig Start Date End Date Taking? Authorizing Provider  olmesartan-hydrochlorothiazide (BENICAR HCT) 40-25 MG tablet TAKE 1 TABLET BY MOUTH EVERY DAY 02/26/21   [name], [name]  acetaminophen (TYLENOL) 325 MG tablet Take 2 tablets (650 mg total) by mouth every 6 (six) hours as needed for mild pain (or Fever >/= 101). 02/12/20   Domenic Polite, MD  alfuzosin (UROXATRAL) 10 MG 24 hr tablet Take 1 tablet (10 mg total) by mouth daily with breakfast. 11/15/20   Hollice Espy, MD  amLODipine (NORVASC) 5 MG tablet Take 1 tablet (5 mg total) by mouth daily. 01/07/21   Regalado, Belkys A, MD  ARIPiprazole (ABILIFY) 2 MG tablet TAKE 1 TABLET BY MOUTH EVERY DAY Patient taking differently: Take 2 mg by mouth daily. 01/03/21   [name], [name]  aspirin EC 81 MG tablet Take 81 mg by mouth daily with breakfast.     [provider]  atorvastatin (LIPITOR) 40 MG tablet 1 tablet by mouth Monday - Friday Patient taking differently: Take 40 mg by mouth daily. 12/06/20   [name], [name]  clotrimazole-betamethasone (LOTRISONE) cream Apply 1 application topically 2 (two) times daily. 07/17/20   [name], [name]  diclofenac Sodium (VOLTAREN) 1 % GEL Apply 4 g topically 4 (four) times daily. 09/03/20   Palumbo, April, MD  empagliflozin (JARDIANCE) 10 MG TABS tablet Take 10 mg by mouth daily.    [provider]  finasteride (PROSCAR) 5 MG tablet Take 1 tablet (5 mg total) by mouth daily. 12/12/20   Hollice Espy, MD  metoprolol tartrate (LOPRESSOR) 50 MG tablet Take 1 tablet (50 mg total) by mouth 2 (two) times daily. Patient not taking: No sig reported 12/06/20   [name],  [name]  Multiple Vitamin (MULTIVITAMIN WITH MINERALS) TABS tablet Take 1 tablet by mouth daily.    [provider]  nystatin (NYSTATIN) powder Apply 1 application topically 3 (three) times daily. 08/26/19   [name], [name]  pantoprazole (PROTONIX) 40 MG tablet Take 1 tablet (40 mg total) by mouth 2 (two) times daily. 01/07/21   Regalado, Belkys A, MD  Semaglutide (RYBELSUS) 14 MG TABS Take 1 tablet by mouth daily. Take 30 minutes before breakfast 12/06/20   [name], [name]  Tetrahydrozoline HCl (VISINE OP) Place 1 drop into both eyes  daily as needed (itching/irritation).    [provider]  traZODone (DESYREL) 50 MG tablet Take 1 tablet (50 mg total) by mouth at bedtime as needed for sleep. 03/08/21   [name], [name]    Physical Exam: Vitals:   03/24/21 1915 03/24/21 1930 03/24/21 2000 03/24/21 2013  BP: 115/75 104/72 104/74   Pulse: 71 72 72   Resp: 16 18 18    Temp:    98.4 F (36.9 C)  TempSrc:    Oral  SpO2: 97% 95% 95%      General:  Appears calm and comfortable and is in NAD Eyes:  PERRL, EOMI, normal lids, iris ENT:  grossly normal hearing, lips & tongue, mmm; appropriate dentition Neck:  no LAD, masses or thyromegaly; no carotid bruits Cardiovascular:  RRR, no m/r/g. No LE edema.  Respiratory:   CTA bilaterally with no wheezes/rales/rhonchi.  Normal respiratory effort. Abdomen:  soft, NT, ND, NABS Back:   normal alignment, no CVAT Skin:  no rash or induration seen on limited exam. Poorly groomed feet and toenails. Mild skin tenting  Musculoskeletal:  grossly normal tone BUE/BLE, good ROM, no bony abnormality Lower extremity:  No LE edema.  Limited foot exam with no ulcerations.  2+ distal pulses. Psychiatric:  grossly normal mood and affect, speech fluent and appropriate, AOx3 Neurologic:  CN 2-12 grossly intact, moves all extremities in coordinated fashion, sensation intact    Radiological Exams on Admission: Independently reviewed - see discussion  in A/P where applicable  DG Chest 2 View  Result Date: 03/24/2021 CLINICAL DATA:  Cough for several days. EXAM: CHEST - 2 VIEW COMPARISON:  01/04/2021. FINDINGS: Cardiac silhouette is normal in size. Normal mediastinal and hilar contours. Subtle airspace opacity suggested in the suprahilar right upper lobe. Remainder of the lungs is clear. No pleural effusion or pneumothorax. Skeletal structures are intact. IMPRESSION: 1. Subtle opacity in the central right upper lobe, somewhat equivocal, but suspicious for pneumonia given the patient's symptoms. No other evidence of acute cardiopulmonary disease. Electronically Signed   By: Lajean Manes M.D.   On: 03/24/2021 14:08    EKG: Independently reviewed.  NSR with rate 80; nonspecific ST changes with no evidence of acute ischemia   Labs on Admission: I have personally reviewed the available labs and imaging studies at the time of the admission.  Pertinent labs:   hgb: 17.3,  sodium: 129,  sugar: 531,  creatinine: 2.05, (1.5-1.8) BUN: 42,  NO GAP,    Assessment/Plan * Community acquired pneumonia- (present on admission) 66 year old male presenting with 2 day history of not feeling well found to have hyperglycemia and CXR concerning for pneumonia -admit to medical telemetry -This appears to be most likely community-acquired pneumonia vs. Viral etiology  -Respiratory virus panel ordered to include pertussis/covid and flu still pending>covid +. Will continue abx until procalcitonin and start covid tx below.  -procalcitonin. -strep urinary antigen  -sputum culture -blood cultures -continue rocephin and azithromycin. -IVF and robitussin DM prn     COVID-19 virus infection- (present on admission) +covid with 2-3 day symptoms around known contact 3 days ago. -not hypoxic, mild symptoms -check covid labs/precautions -robitussin DM, vitamins, albuterol prn  -cycle threshold 14.4, start paxlovid. Pharmacy consult for drug interaction. Renally  dose if okay  -hold norvasc/lipitor although he is not taking them anyway  -IS to bedside   Uncontrolled type 2 diabetes mellitus with hyperglycemia ([place])- (present on admission) Last a1c in 11/2020 was 8.4 Per outpatient medication on rybelsus and  jardiance.  Pneumonia, poor diet and ? Compliance of medication likely driving high BS No evidence of DKA or HHS Given 8 units novolog in ED and 1L bolus Start IVF Starting long acting insulin at 8 units (.2mg /kg), continue with SSI Hold jardiance/rybelsus with AKI (has not been taking anyway)   Acute renal failure superimposed on stage 3a chronic kidney disease ([place])- (present on admission) Baseline appears to be around creatinine 1.5-1.8 Given 1L bolus in ED, continue IVF at 100cc/hour Avoid nephrotoxic drugs UA with no hematuria I/O Trend renal function   Essential hypertension- (present on admission) Blood pressures have been acceptable Has not been taking any of his medication (novasc, benicar-hctz, metoprolol) Will hold meds as of now since BP have been decently controlled   HLD (hyperlipidemia)- (present on admission) Lipid panel 11/2020 Hold lipitor while on paxlovid and 5 days after. No taking medication as prescribed   Depressive disorder- (present on admission) Has not been taking his Abilify. Will continue to hold this in setting of paxlovid. Hold x 5 days after tx course.   Insomnia- (present on admission) Hold trazodone while on paxlovid.   Benign prostatic hyperplasia with lower urinary tract symptoms-resolved as of 03/24/2021, (present on admission) Continue proscar and uroxatral   History of prostate cancer with recurrent urinary obstruction  Noncompliant with medication. No complaints of retention   Polycythemia- (present on admission) Continue ASA   History of noncompliance with medical treatment Social work consult to address barriers       There is no height or weight on file to calculate BMI.   Level  of care: Telemetry Medical DVT prophylaxis:  Lovenox  Code Status:  Full - confirmed with patient Family Communication: None present Disposition Plan:  The patient is from: home  Anticipated d/c is to: home   Patient placed in observation as anticipate less than 2 midnight stay. Requires hospitalization for treatment of covid/pneumonia, IVF, hyperglycemia and close monitoring.     Patient is currently: stable Consults called: none  Admission status:  observation    Orma Flaming MD [facility]   How to contact the [facility] Attending or Consulting provider [place] or covering provider during after hours [place], for this patient?  Check the care team in [facility] and look for a) attending/consulting [facility] provider listed and b) the [facility] team listed Log into [email or web address] and use [place]'s universal password to access. If you do not have the password, please contact the hospital operator. Locate the [facility] provider you are looking for under [facility] and page to a number that you can be directly reached. If you still have difficulty reaching the provider, please page the [facility] (Director on Call) for the Hospitalists listed on amion for assistance.   03/24/2021, 8:57 PM

## 2021-03-24 NOTE — Assessment & Plan Note (Addendum)
Lipid panel 11/2020 Hold lipitor while on paxlovid and 5 days after. No taking medication as prescribed

## 2021-03-24 NOTE — Assessment & Plan Note (Addendum)
+  covid with 2-3 day symptoms around known contact 3 days ago. -not hypoxic, mild symptoms -check covid labs/precautions -robitussin DM, vitamins, albuterol prn  -cycle threshold 14.4, start paxlovid. Pharmacy consult for drug interaction. Renally dose if okay  -hold norvasc/lipitor although he is not taking them anyway  -IS to bedside

## 2021-03-25 ENCOUNTER — Encounter (HOSPITAL_COMMUNITY): Payer: Self-pay | Admitting: Family Medicine

## 2021-03-25 ENCOUNTER — Other Ambulatory Visit: Payer: Self-pay

## 2021-03-25 ENCOUNTER — Observation Stay (HOSPITAL_COMMUNITY): Payer: Medicare Other

## 2021-03-25 DIAGNOSIS — I10 Essential (primary) hypertension: Secondary | ICD-10-CM | POA: Diagnosis not present

## 2021-03-25 DIAGNOSIS — J189 Pneumonia, unspecified organism: Secondary | ICD-10-CM | POA: Diagnosis not present

## 2021-03-25 DIAGNOSIS — N1831 Chronic kidney disease, stage 3a: Secondary | ICD-10-CM

## 2021-03-25 DIAGNOSIS — R059 Cough, unspecified: Secondary | ICD-10-CM | POA: Diagnosis not present

## 2021-03-25 DIAGNOSIS — U071 COVID-19: Secondary | ICD-10-CM | POA: Diagnosis not present

## 2021-03-25 DIAGNOSIS — N179 Acute kidney failure, unspecified: Secondary | ICD-10-CM

## 2021-03-25 DIAGNOSIS — R509 Fever, unspecified: Secondary | ICD-10-CM | POA: Diagnosis not present

## 2021-03-25 LAB — CBC
HCT: 48.8 % (ref 39.0–52.0)
Hemoglobin: 16 g/dL (ref 13.0–17.0)
MCH: 28.7 pg (ref 26.0–34.0)
MCHC: 32.8 g/dL (ref 30.0–36.0)
MCV: 87.5 fL (ref 80.0–100.0)
Platelets: 133 10*3/uL — ABNORMAL LOW (ref 150–400)
RBC: 5.58 MIL/uL (ref 4.22–5.81)
RDW: 13 % (ref 11.5–15.5)
WBC: 4.7 10*3/uL (ref 4.0–10.5)
nRBC: 0 % (ref 0.0–0.2)

## 2021-03-25 LAB — BASIC METABOLIC PANEL
Anion gap: 9 (ref 5–15)
BUN: 31 mg/dL — ABNORMAL HIGH (ref 8–23)
CO2: 21 mmol/L — ABNORMAL LOW (ref 22–32)
Calcium: 8.8 mg/dL — ABNORMAL LOW (ref 8.9–10.3)
Chloride: 104 mmol/L (ref 98–111)
Creatinine, Ser: 1.6 mg/dL — ABNORMAL HIGH (ref 0.61–1.24)
GFR, Estimated: 47 mL/min — ABNORMAL LOW (ref >60)
Glucose, Bld: 141 mg/dL — ABNORMAL HIGH (ref 70–99)
Potassium: 4.1 mmol/L (ref 3.5–5.1)
Sodium: 134 mmol/L — ABNORMAL LOW (ref 135–145)

## 2021-03-25 LAB — CBG MONITORING, ED
Glucose-Capillary: 172 mg/dL — ABNORMAL HIGH (ref 70–99)
Glucose-Capillary: 254 mg/dL — ABNORMAL HIGH (ref 70–99)

## 2021-03-25 LAB — STREP PNEUMONIAE URINARY ANTIGEN: Strep Pneumo Urinary Antigen: NEGATIVE

## 2021-03-25 LAB — GLUCOSE, CAPILLARY
Glucose-Capillary: 207 mg/dL — ABNORMAL HIGH (ref 70–99)
Glucose-Capillary: 149 mg/dL — ABNORMAL HIGH (ref 70–99)
Glucose-Capillary: 166 mg/dL — ABNORMAL HIGH (ref 70–99)

## 2021-03-25 LAB — D-DIMER, QUANTITATIVE
D-Dimer, Quant: 0.92 ug/mL-FEU — ABNORMAL HIGH (ref 0.00–0.50)
D-Dimer, Quant: 0.86 ug/mL-FEU — ABNORMAL HIGH (ref 0.00–0.50)

## 2021-03-25 LAB — C-REACTIVE PROTEIN
CRP: 6.5 mg/dL — ABNORMAL HIGH (ref <1.0)
CRP: 6.6 mg/dL — ABNORMAL HIGH (ref <1.0)

## 2021-03-25 LAB — FERRITIN: Ferritin: 300 ng/mL (ref 24–336)

## 2021-03-25 LAB — FIBRINOGEN: Fibrinogen: 563 mg/dL — ABNORMAL HIGH (ref 210–475)

## 2021-03-25 LAB — LACTATE DEHYDROGENASE: LDH: 356 U/L — ABNORMAL HIGH (ref 98–192)

## 2021-03-25 MED ORDER — ALFUZOSIN HCL ER 10 MG PO TB24
10.0000 mg | ORAL_TABLET | Freq: Every day | ORAL | Status: DC
Start: 2021-03-26 — End: 2021-03-25

## 2021-03-25 NOTE — Evaluation (Signed)
Physical Therapy Evaluation Patient Details Name: [name] MRN: [id number] DOB: Dec 01, 1955 Today's Date: 03/25/2021  History of Present Illness  Pt is a 66 y.o. male who presented 03/24/21 with weakness and depression. Pt with CAP and COVID+. PMH: HTN, prostate cancer, CKD, DM, gout, and macular degeneration.   Clinical Impression  Pt presents with condition above and deficits mentioned below, see PT Problem List. PTA, he was independent without an AD, living alone in an apartment with 2 STE. Currently, pt displays deficits primarily in cognition, such as memory and awareness of his deficits and safety. In addition, he does display some mild deficits in balance with R leg pain causing an antalgic gait pattern. He has a hx of falls also. Pt was able to ambulate around the room at a slow pace, suggestive of risk for falls, without LOB or UE support with supervision. Recommending pt d/c with frequent/constant assistance for safety and with HHPT follow-up to address his deficits in balance and leg pain to reduce his risk for subsequent falls. Will continue to follow acutely.       Recommendations for follow up therapy are one component of a multi-disciplinary discharge planning process, led by the attending physician.  Recommendations may be updated based on patient status, additional functional criteria and insurance authorization.  Follow Up Recommendations Home health PT    Assistance Recommended at Discharge Frequent or constant Supervision/Assistance  Patient can return home with the following  Direct supervision/assist for medications management;Direct supervision/assist for financial management;Assist for transportation;Help with stairs or ramp for entrance    Equipment Recommendations None recommended by PT  Recommendations for Other Services       Functional Status Assessment Patient has had a recent decline in their functional status and demonstrates the ability to make significant  improvements in function in a reasonable and predictable amount of time.     Precautions / Restrictions Precautions Precautions: Fall (low) Restrictions Weight Bearing Restrictions: No      Mobility  Bed Mobility               General bed mobility comments: Pt sitting EOB upon arrival.    Transfers Overall transfer level: Modified independent Equipment used: None Transfers: Sit to/from Stand Sit to Stand: Modified independent (Device/Increase time)           General transfer comment: Pt able to come to stand from EOB safely without LOB.    Ambulation/Gait Ambulation/Gait assistance: Supervision Gait Distance (Feet): 45 Feet Assistive device: None Gait Pattern/deviations: Step-to pattern;Decreased step length - left;Decreased stance time - right;Decreased stride length;Antalgic Gait velocity: reduced Gait velocity interpretation: 1.31 - 2.62 ft/sec, indicative of limited community ambulator   General Gait Details: Pt with slow, antalgic gait pattern due to R leg pain from a prior fall. No LOB, supervision for safety and line management. Pt reports current speed slower than normal.  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 without LOB)                                           Pertinent Vitals/Pain Pain Assessment: Faces Faces Pain Scale: Hurts little more Pain Location: R leg Pain  Descriptors / Indicators: Discomfort;Guarding Pain Intervention(s): Limited activity within patient's tolerance;Monitored during session;Repositioned    Home Living Family/patient expects to be discharged to:: Private residence Living Arrangements: Alone Available Help at Discharge: Friend(s) Type of Home: Apartment Home Access: Stairs to enter Entrance Stairs-Rails: None Entrance  Stairs-Number of Steps: 2 steps   Home Layout: One level Home Equipment: Shower seat      Prior Function Prior Level of Function : Independent/Modified Independent             Mobility Comments: indep, no DME ADLs Comments: Indep, friend takes him to appts and groceries     Hand Dominance   Dominant Hand: Right    Extremity/Trunk Assessment   Upper Extremity Assessment Upper Extremity Assessment: Defer to OT evaluation    Lower Extremity Assessment Lower Extremity Assessment: RLE deficits/detail RLE Deficits / Details: Pain limiting stance time on R, but overall bil legs WFL through functional observation    Cervical / Trunk Assessment Cervical / Trunk Assessment: Normal  Communication   Communication: No difficulties  Cognition Arousal/Alertness: Awake/alert Behavior During Therapy: Flat affect Overall Cognitive Status: No family/caregiver present to determine baseline cognitive functioning Area of Impairment: Attention;Memory;Safety/judgement;Awareness                   Current Attention Level: Selective Memory: Decreased short-term memory   Safety/Judgement: Decreased awareness of deficits;Decreased awareness of safety Awareness: Emergent   General Comments: Pt with flat affect throughout and self-distracts at times. Pt repeating "thank you" almost every ~30 seconds, even when PT not even near pt or talking to pt but rather having conversation with RN at door. One moment says he has not had any falls and the next he reports several falls causing pain in his R leg. Memory deficits along with deficits into self-awareness of his cog deficits.        General Comments General comments (skin integrity, edema, etc.): Educated pt that PT recommends someone stay with him at d/c due to cognitive deficits    Exercises     Assessment/Plan    PT Assessment Patient needs continued PT services  PT Problem List Decreased balance;Decreased mobility;Decreased  cognition;Decreased safety awareness       PT Treatment Interventions DME instruction;Gait training;Stair training;Functional mobility training;Therapeutic activities;Therapeutic exercise;Balance training;Neuromuscular re-education;Cognitive remediation;Patient/family education    PT Goals (Current goals can be found in the Care Plan section)  Acute Rehab PT Goals Patient Stated Goal: to have help at home at d/c PT Goal Formulation: With patient Time For Goal Achievement: 04/08/21 Potential to Achieve Goals: Good    Frequency Min 3X/week     Co-evaluation               AM-PAC PT "6 Clicks" Mobility  Outcome Measure Help needed turning from your back to your side while in a flat bed without using bedrails?: None Help needed moving from lying on your back to sitting on the side of a flat bed without using bedrails?: None Help needed moving to and from a bed to a chair (including a wheelchair)?: None Help needed standing up from a chair using your arms (e.g., wheelchair or bedside chair)?: None Help needed to walk in hospital room?: A Little Help needed climbing 3-5 steps with a railing? : A Little 6 Click Score: 22    End of Session   Activity Tolerance: Patient tolerated treatment well Patient left: in bed;with call bell/phone within reach;with nursing/sitter in room Nurse Communication: Mobility status PT Visit Diagnosis:  Other abnormalities of gait and mobility (R26.89);Unsteadiness on feet (R26.81);Difficulty in walking, not elsewhere classified (R26.2);Pain Pain - Right/Left: Right Pain - part of body: Leg    Time: 4383-8184 PT Time Calculation (min) (ACUTE ONLY): 16 min   Charges:   PT Evaluation $PT Eval Low Complexity: 1 Low          Moishe Spice, PT, DPT Acute Rehabilitation Services  Pager: [phone] Office: [phone]   Orvan Falconer 03/25/2021, 5:40 PM

## 2021-03-25 NOTE — Progress Notes (Addendum)
°  Transition of Care [facility]) Screening Note   Patient Details  Name: [name] Date of Birth: 10-Aug-1955   [facility]) CM/SW Contact:    Alfredia Ferguson, LCSW Phone Number: 03/25/2021, 8:10 AM    Transition of Care Department [facility]) has reviewed patient and no TOC needs have been identified at this time. We will continue to monitor patient advancement through interdisciplinary progression rounds. If new patient transition needs arise, please place a TOC consult.

## 2021-03-25 NOTE — Evaluation (Signed)
Occupational Therapy Evaluation Patient Details Name: [name] MRN: [id number] DOB: [birth year] Today's Date: 03/25/2021   History of Present Illness Pt is a 66 y.o. male who presented 03/24/21 with weakness and depression. Pt with CAP and COVID+. PMH: HTN, prostate cancer, CKD, DM, gout, and macular degeneration.   Clinical Impression   Pt admitted for concerns listed above. PTA pt reported that he was independent with all ADL's and IADL's. At this time, pt is limited by fatigue, weakness, and cognitive deficits. Pt refusing to ambulate further than standing at the bed this session due to fatigue. Pt has a history of falls, however feels that he will be fine to go home. OT provided education to pt about having assist for higher level cognitive task, such as medication management once home. Recommending HHOT to maximize pt's safety and independence. OT will follow up acutely.      Recommendations for follow up therapy are one component of a multi-disciplinary discharge planning process, led by the attending physician.  Recommendations may be updated based on patient status, additional functional criteria and insurance authorization.   Follow Up Recommendations  Home health OT    Assistance Recommended at Discharge Frequent or constant Supervision/Assistance  Patient can return home with the following A little help with walking and/or transfers;A little help with bathing/dressing/bathroom;Direct supervision/assist for medications management;Assistance with cooking/housework;Direct supervision/assist for financial management    Functional Status Assessment  Patient has had a recent decline in their functional status and demonstrates the ability to make significant improvements in function in a reasonable and predictable amount of time.  Equipment Recommendations  BSC/3in1;Other (comment) (RW)    Recommendations for Other Services       Precautions / Restrictions Precautions Precautions:  Fall (low) Restrictions Weight Bearing Restrictions: No      Mobility Bed Mobility Overal bed mobility: Needs Assistance Bed Mobility: Supine to Sit;Sit to Supine     Supine to sit: Min guard Sit to supine: Supervision   General bed mobility comments: No physical assist needed    Transfers Overall transfer level: Needs assistance Equipment used: None Transfers: Sit to/from Stand Sit to Stand: Min guard           General transfer comment: Pt able to come to stand from EOB safely without LOB.      Balance Overall balance assessment: Mild deficits observed, not formally tested                                         ADL either performed or assessed with clinical judgement   ADL Overall ADL's : Needs assistance/impaired                                       General ADL Comments: Pt requiring min guard for all standing ADL's.     Vision Baseline Vision/History: 0 No visual deficits Ability to See in Adequate Light: 0 Adequate Patient Visual Report: No change from baseline Vision Assessment?: No apparent visual deficits     Perception     Praxis      Pertinent Vitals/Pain Pain Assessment: Faces Faces Pain Scale: Hurts little more Pain Location: R leg Pain Descriptors / Indicators: Discomfort;Guarding Pain Intervention(s): Limited activity within patient's tolerance     Hand Dominance Right   Extremity/Trunk Assessment Upper Extremity  Assessment Upper Extremity Assessment: Generalized weakness   Lower Extremity Assessment Lower Extremity Assessment: Defer to PT evaluation RLE Deficits / Details: Pain limiting stance time on R, but overall bil legs WFL through functional observation   Cervical / Trunk Assessment Cervical / Trunk Assessment: Normal   Communication Communication Communication: No difficulties   Cognition Arousal/Alertness: Awake/alert Behavior During Therapy: Flat affect Overall Cognitive Status:  No family/caregiver present to determine baseline cognitive functioning Area of Impairment: Attention;Memory;Safety/judgement;Awareness                   Current Attention Level: Selective Memory: Decreased short-term memory   Safety/Judgement: Decreased awareness of deficits;Decreased awareness of safety Awareness: Emergent   General Comments: Pt with flat affect throughout and self-distracts at times. Pt repeating "thank you" almost every ~30 seconds. Memory deficits along with deficits into self-awareness of his cog deficits.     General Comments  Educated pt to have assist at home due to cognitive deficits.    Exercises     Shoulder Instructions      Home Living Family/patient expects to be discharged to:: Private residence Living Arrangements: Alone Available Help at Discharge: Friend(s) Type of Home: Apartment Home Access: Stairs to enter [employer] of Steps: 2 steps Entrance Stairs-Rails: None Home Layout: One level     Bathroom Shower/Tub: Teacher, early years/pre: Standard     Home Equipment: Shower seat          Prior Functioning/Environment Prior Level of Function : Independent/Modified Independent             Mobility Comments: indep, no DME ADLs Comments: Indep, friend takes him to appts and groceries        OT Problem List: Decreased strength;Decreased range of motion;Decreased activity tolerance;Impaired balance (sitting and/or standing);Decreased cognition;Decreased safety awareness;Decreased knowledge of use of DME or AE      OT Treatment/Interventions: Self-care/ADL training;Therapeutic exercise;Energy conservation;DME and/or AE instruction;Therapeutic activities;Patient/family education;Balance training    OT Goals(Current goals can be found in the care plan section) Acute Rehab OT Goals Patient Stated Goal: to go home OT Goal Formulation: With patient Time For Goal Achievement: 04/08/21 Potential to  Achieve Goals: Fair ADL Goals Pt Will Perform Grooming: with modified independence;standing Pt Will Perform Lower Body Bathing: with modified independence;sitting/lateral leans;sit to/from stand Pt Will Perform Lower Body Dressing: with modified independence;sitting/lateral leans;sit to/from stand Pt Will Transfer to Toilet: with modified independence;ambulating Pt Will Perform Toileting - Clothing Manipulation and hygiene: with modified independence;sitting/lateral leans;sit to/from stand  OT Frequency: Min 2X/week    Co-evaluation              AM-PAC OT "6 Clicks" Daily Activity     Outcome Measure Help from another person eating meals?: A Little Help from another person taking care of personal grooming?: A Little Help from another person toileting, which includes using toliet, bedpan, or urinal?: A Little Help from another person bathing (including washing, rinsing, drying)?: A Little Help from another person to put on and taking off regular upper body clothing?: A Little Help from another person to put on and taking off regular lower body clothing?: A Little 6 Click Score: 18   End of Session Equipment Utilized During Treatment: Gait belt Nurse Communication: Mobility status  Activity Tolerance: Patient limited by fatigue Patient left: in bed;with call bell/phone within reach;with bed alarm set  OT Visit Diagnosis: Unsteadiness on feet (R26.81);Other abnormalities of gait and mobility (R26.89);Muscle weakness (generalized) (M62.81)  Time: 2536-6440 OT Time Calculation (min): 16 min Charges:  OT General Charges $OT Visit: 1 Visit OT Evaluation $OT Eval Moderate Complexity: 1 Mod  Mataio Mele H., OTR/L Acute Rehabilitation  Karlin Heilman Elane Yolanda Bonine 03/25/2021, 8:54 PM

## 2021-03-25 NOTE — Progress Notes (Signed)
PROGRESS NOTE        PATIENT DETAILS Name: [name] Age: 66 y.o. Sex: male Date of Birth: [birth year] Admit Date: 03/24/2021 Admitting Physician Orma Flaming, MD MBW:[id number], Randy Burke, [name]  Brief Narrative: Patient is a 66 y.o. male with history of DM-2, HTN, HLD, CKD stage IIIb, prostate cancer, depression-who was feeling weak/febrile/coughing for past 3 days-presented to the ED and was found to have COVID-19 infection with PNA.  See below for further details.   Subjective:  Feels better-although still weak.  Objective: Vitals: Blood pressure (!) 132/93, pulse 69, temperature 98.4 F (36.9 C), temperature source Oral, resp. rate 15, height 5\' 6"  (1.676 m), weight 79.8 kg, SpO2 96 %.   Exam: Gen Exam:Alert awake-not in any distress HEENT:atraumatic, normocephalic Chest: B/L clear to auscultation anteriorly CVS:S1S2 regular Abdomen:soft non tender, non distended Extremities:no edema Neurology: Non focal Skin: no rash  Pertinent Labs/Radiology: Recent Labs  Lab 03/25/21 0649  WBC 4.7  HGB 16.0  PLT 133*  NA 134*  K 4.1  CREATININE 1.60*    Assessment/Plan: PNA: Reviewed CXR on admission-appears to have a focal infiltrate on right upper lobe area-likely bacterial-and not consistent with COVID-19 PNA which is mostly groundglass opacities.  He feels better-afebrile-still coughing-not hypoxic-plan is to continue IV antibiotics and follow cultures.  COVID-19 infection: Continue Paxlovid.  Unfortunately-he has only received 1 dose of a Moderna vaccine several years back.  AKI on CKD stage IIIb: AKI hemodynamically mediated in the setting of PNA/osmotic diuresis from hyperglycemia-improving with supportive care.  Uncontrolled hyperglycemia (A1c 8.4 on 12/06/2020): Sugars were more than 500 when he first presented-has been started on Levemir 8 units daily along with SSI-with better sugar control.  Reassess CBGs on 1/9 and adjust accordingly.  Recent  Labs    03/24/21 1245 03/24/21 2059 03/25/21 0759  GLUCAP 505* 292* 172*    HTN: BP relatively stable-amlodipine/olmesartan/HCTZ/metoprolol on hold-we will allow BP to come up more before resuming some of his antihypertensives.  HLD: Hold Lipitor as patient is on Paxlovid  Depression: Appears stable-apparently has not been taking his Abilify-this has not been resumed as patient has been started on Paxlovid  BMI Estimated body mass index is 28.41 kg/m as calculated from the following:   Height as of this encounter: 5\' 6"  (1.676 m).   Weight as of this encounter: 79.8 kg.    Procedures: None Consults: None DVT Prophylaxis: Lovenox Code Status:Full code  Family Communication: None at bedside  Time spent: 35 minutes-Greater than 50% of this time was spent in counseling, explanation of diagnosis, planning of further management, and coordination of care.   Disposition Plan: Status is: Observation  The patient will require care spanning > 2 midnights and should be moved to inpatient because: PNA/COVID-19 infection-with severe generalized weakness-needs ongoing IV antibiotics/PT eval before consideration of discharge.   Diet: Diet Order             Diet Carb Modified Fluid consistency: Thin; Room service appropriate? Yes  Diet effective now                     Antimicrobial agents: Anti-infectives (From admission, onward)    Start     Dose/Rate Route Frequency Ordered Stop   03/25/21 1515  cefTRIAXone (ROCEPHIN) 1 g in sodium chloride 0.9 % 100 mL IVPB  1 g 200 mL/hr over 30 Minutes Intravenous Every 24 hours 03/24/21 1513 03/30/21 1514   03/25/21 1445  azithromycin (ZITHROMAX) 500 mg in sodium chloride 0.9 % 250 mL IVPB        500 mg 250 mL/hr over 60 Minutes Intravenous Every 24 hours 03/24/21 1513 03/30/21 1444   03/24/21 2200  nirmatrelvir/ritonavir EUA (renal dosing) (PAXLOVID) 2 tablet        2 tablet Oral 2 times daily 03/24/21 1810 03/29/21 2159    03/24/21 1445  cefTRIAXone (ROCEPHIN) 1 g in sodium chloride 0.9 % 100 mL IVPB        1 g 200 mL/hr over 30 Minutes Intravenous  Once 03/24/21 1442 03/24/21 1631   03/24/21 1445  azithromycin (ZITHROMAX) 500 mg in sodium chloride 0.9 % 250 mL IVPB        500 mg 250 mL/hr over 60 Minutes Intravenous  Once 03/24/21 1442 03/24/21 2315        MEDICATIONS: Scheduled Meds:  albuterol  2 puff Inhalation Q6H   vitamin C  500 mg Oral Daily   aspirin EC  81 mg Oral Q breakfast   enoxaparin (LOVENOX) injection  30 mg Subcutaneous Q24H   insulin aspart  0-9 Units Subcutaneous TID WC   insulin detemir  8 Units Subcutaneous Daily   multivitamin with minerals  1 tablet Oral Daily   nirmatrelvir/ritonavir EUA (renal dosing)  2 tablet Oral BID   zinc sulfate  220 mg Oral Daily   Continuous Infusions:  sodium chloride 100 mL/hr at 03/24/21 2214   azithromycin     cefTRIAXone (ROCEPHIN)  IV     PRN Meds:.acetaminophen **OR** acetaminophen, guaiFENesin-dextromethorphan, ondansetron **OR** ondansetron (ZOFRAN) IV   I have personally reviewed following labs and imaging studies  LABORATORY DATA: CBC: Recent Labs  Lab 03/24/21 1243 03/25/21 0649  WBC 6.3 4.7  HGB 17.3* 16.0  HCT 51.5 48.8  MCV 87.4 87.5  PLT 137* 133*    Basic Metabolic Panel: Recent Labs  Lab 03/24/21 1243 03/25/21 0649  NA 129* 134*  K 4.7 4.1  CL 96* 104  CO2 23 21*  GLUCOSE 531* 141*  BUN 42* 31*  CREATININE 2.05* 1.60*  CALCIUM 8.8* 8.8*    GFR: Estimated Creatinine Clearance: 44.5 mL/min (A) (by C-G formula based on SCr of 1.6 mg/dL (H)).  Liver Function Tests: No results for input(s): AST, ALT, ALKPHOS, BILITOT, PROT, ALBUMIN in the last 168 hours. No results for input(s): LIPASE, AMYLASE in the last 168 hours. No results for input(s): AMMONIA in the last 168 hours.  Coagulation Profile: No results for input(s): INR, PROTIME in the last 168 hours.  Cardiac Enzymes: No results for input(s):  CKTOTAL, CKMB, CKMBINDEX, TROPONINI in the last 168 hours.  BNP (last 3 results) No results for input(s): PROBNP in the last 8760 hours.  Lipid Profile: No results for input(s): CHOL, HDL, LDLCALC, TRIG, CHOLHDL, LDLDIRECT in the last 72 hours.  Thyroid Function Tests: No results for input(s): TSH, T4TOTAL, FREET4, T3FREE, THYROIDAB in the last 72 hours.  Anemia Panel: Recent Labs    03/24/21 0013  FERRITIN 300    Urine analysis:    Component Value Date/Time   COLORURINE YELLOW 03/24/2021 1243   APPEARANCEUR CLEAR 03/24/2021 1243   APPEARANCEUR Clear 12/12/2020 1046   LABSPEC 1.010 03/24/2021 1243   PHURINE 5.5 03/24/2021 1243   GLUCOSEU >=500 (A) 03/24/2021 1243   HGBUR SMALL (A) 03/24/2021 1243   BILIRUBINUR NEGATIVE 03/24/2021 1243   BILIRUBINUR  Negative 12/12/2020 1046   KETONESUR NEGATIVE 03/24/2021 1243   PROTEINUR 30 (A) 03/24/2021 1243   UROBILINOGEN 0.2 08/26/2019 1516   UROBILINOGEN 0.2 04/18/2008 1153   NITRITE NEGATIVE 03/24/2021 1243   LEUKOCYTESUR NEGATIVE 03/24/2021 1243    Sepsis Labs: Lactic Acid, Venous    Component Value Date/Time   LATICACIDVEN 1.1 10/02/2019 1933    MICROBIOLOGY: Recent Results (from the past 240 hour(s))  Resp Panel by RT-PCR (Flu A&B, Covid) Nasopharyngeal Swab     Status: Abnormal   Collection Time: 03/24/21  1:14 PM   Specimen: Nasopharyngeal Swab; Nasopharyngeal(NP) swabs in vial transport medium  Result Value Ref Range Status   SARS Coronavirus 2 by RT PCR POSITIVE (A) NEGATIVE Final    Comment: (NOTE) SARS-CoV-2 target nucleic acids are DETECTED.  The SARS-CoV-2 RNA is generally detectable in upper respiratory specimens during the acute phase of infection. Positive results are indicative of the presence of the identified virus, but do not rule out bacterial infection or co-infection with other pathogens not detected by the test. Clinical correlation with patient history and other diagnostic information is  necessary to determine patient infection status. The expected result is Negative.  Fact Sheet for Patients: EntrepreneurPulse.com.au  Fact Sheet for Healthcare Providers: IncredibleEmployment.be  This test is not yet approved or cleared by the [place] FDA and  has been authorized for detection and/or diagnosis of SARS-CoV-2 by FDA under an Emergency Use Authorization (EUA).  This EUA will remain in effect (meaning this test can be used) for the duration of  the COVID-19 declaration under Section 564(b)(1) of the A ct, 21 U.S.C. section 360bbb-3(b)(1), unless the authorization is terminated or revoked sooner.     Influenza A by PCR NEGATIVE NEGATIVE Final   Influenza B by PCR NEGATIVE NEGATIVE Final    Comment: (NOTE) The Xpert Xpress SARS-CoV-2/FLU/RSV plus assay is intended as an aid in the diagnosis of influenza from Nasopharyngeal swab specimens and should not be used as a sole basis for treatment. Nasal washings and aspirates are unacceptable for Xpert Xpress SARS-CoV-2/FLU/RSV testing.  Fact Sheet for Patients: EntrepreneurPulse.com.au  Fact Sheet for Healthcare Providers: IncredibleEmployment.be  This test is not yet approved or cleared by the [place] FDA and has been authorized for detection and/or diagnosis of SARS-CoV-2 by FDA under an Emergency Use Authorization (EUA). This EUA will remain in effect (meaning this test can be used) for the duration of the COVID-19 declaration under Section 564(b)(1) of the Act, 21 U.S.C. section 360bbb-3(b)(1), unless the authorization is terminated or revoked.  Performed at [facility], [place] [address] [place], [place] [address]   Respiratory (~20 pathogens) panel by PCR     Status: None   Collection Time: 03/24/21  3:02 PM   Specimen: Nasopharyngeal Swab; Respiratory  Result Value Ref Range Status   Adenovirus NOT DETECTED NOT DETECTED  Final   Coronavirus 229E NOT DETECTED NOT DETECTED Final    Comment: (NOTE) The Coronavirus on the Respiratory Panel, DOES NOT test for the novel  Coronavirus (2019 nCoV)    Coronavirus HKU1 NOT DETECTED NOT DETECTED Final   Coronavirus NL63 NOT DETECTED NOT DETECTED Final   Coronavirus OC43 NOT DETECTED NOT DETECTED Final   Metapneumovirus NOT DETECTED NOT DETECTED Final   Rhinovirus / Enterovirus NOT DETECTED NOT DETECTED Final   Influenza A NOT DETECTED NOT DETECTED Final   Influenza B NOT DETECTED NOT DETECTED Final   Parainfluenza Virus 1 NOT DETECTED NOT DETECTED Final   Parainfluenza  Virus 2 NOT DETECTED NOT DETECTED Final   Parainfluenza Virus 3 NOT DETECTED NOT DETECTED Final   Parainfluenza Virus 4 NOT DETECTED NOT DETECTED Final   Respiratory Syncytial Virus NOT DETECTED NOT DETECTED Final   Bordetella pertussis NOT DETECTED NOT DETECTED Final   Bordetella Parapertussis NOT DETECTED NOT DETECTED Final   Chlamydophila pneumoniae NOT DETECTED NOT DETECTED Final   Mycoplasma pneumoniae NOT DETECTED NOT DETECTED Final    Comment: Performed at [facility], [place] [address], [place], [place] [address]  Culture, blood (routine x 2)     Status: None (Preliminary result)   Collection Time: 03/24/21  5:00 PM   Specimen: BLOOD RIGHT HAND  Result Value Ref Range Status   Specimen Description BLOOD RIGHT HAND  Final   Special Requests   Final    BOTTLES DRAWN AEROBIC AND ANAEROBIC Blood Culture adequate volume   Culture   Final    NO GROWTH < 24 HOURS Performed at [facility], [place] [address], [place], [place] [address]    Report Status PENDING  Incomplete  Culture, blood (routine x 2)     Status: None (Preliminary result)   Collection Time: 03/24/21  5:11 PM   Specimen: BLOOD RIGHT ARM  Result Value Ref Range Status   Specimen Description BLOOD RIGHT ARM  Final   Special Requests   Final    BOTTLES DRAWN AEROBIC AND ANAEROBIC Blood Culture results may not be optimal  due to an inadequate volume of blood received in culture bottles   Culture   Final    NO GROWTH < 24 HOURS Performed at [facility], [place] [address], [place],  [address]    Report Status PENDING  Incomplete    RADIOLOGY STUDIES/RESULTS: DG Chest 2 View  Result Date: 03/24/2021 CLINICAL DATA:  Cough for several days. EXAM: CHEST - 2 VIEW COMPARISON:  01/04/2021. FINDINGS: Cardiac silhouette is normal in size. Normal mediastinal and hilar contours. Subtle airspace opacity suggested in the suprahilar right upper lobe. Remainder of the lungs is clear. No pleural effusion or pneumothorax. Skeletal structures are intact. IMPRESSION: 1. Subtle opacity in the central right upper lobe, somewhat equivocal, but suspicious for pneumonia given the patient's symptoms. No other evidence of acute cardiopulmonary disease. Electronically Signed   By: Lajean Manes M.D.   On: 03/24/2021 14:08   DG Chest Port 1V same Day  Result Date: 03/25/2021 CLINICAL DATA:  66 year old male with cough and fever. EXAM: PORTABLE CHEST 1 VIEW COMPARISON:  Chest radiographs 03/24/2021 and earlier. FINDINGS: Portable AP semi upright view at 0719 hours. Normal lung volumes and mediastinal contours. Visualized tracheal air column is within normal limits. Regressed asymmetric vague right upper lung opacity and lung markings now appear to be at baseline. Right nipple shadow. No pneumothorax or pleural effusion. No acute osseous abnormality identified. Paucity of bowel gas in the upper abdomen. IMPRESSION: Regressed vague asymmetric right upper lung opacity. No acute cardiopulmonary abnormality now. Electronically Signed   By: Genevie Ann M.D.   On: 03/25/2021 07:30     LOS: 0 days   Oren Binet, MD  [facility]    To contact the attending provider between 7A-7P or the covering provider during after hours 7P-7A, please log into the web site [email or web address] and access using universal [place] password for that web  site. If you do not have the password, please call the hospital operator.  03/25/2021, 9:41 AM

## 2021-03-25 NOTE — ED Notes (Signed)
Received verbal report from Matt B RN at this time °

## 2021-03-25 NOTE — ED Notes (Signed)
Pt stated he needed to have a bm and that he was able to walk. Pt ambulated to restroom at this time without assistance

## 2021-03-25 NOTE — Progress Notes (Incomplete)
NEW ADMISSION NOTE New Admission Note:   Arrival Method: wheel chair Mental Orientation: A&O x4 Telemetry: yes box 11 Assessment: Completed Skin: intact, caulousis  IV: Pain: Tubes: Safety Measures: Safety Fall Prevention Plan has been given, discussed and signed Admission: Completed [facility] Orientation: Patient has been orientated to the room, unit and staff.  Family:  Orders have been reviewed and implemented. Will continue to monitor the patient. Call light has been placed within reach and bed alarm has been activated.   Berneta Levins, RN

## 2021-03-26 DIAGNOSIS — N1832 Chronic kidney disease, stage 3b: Secondary | ICD-10-CM | POA: Diagnosis present

## 2021-03-26 DIAGNOSIS — E871 Hypo-osmolality and hyponatremia: Secondary | ICD-10-CM | POA: Diagnosis present

## 2021-03-26 DIAGNOSIS — N179 Acute kidney failure, unspecified: Secondary | ICD-10-CM | POA: Diagnosis not present

## 2021-03-26 DIAGNOSIS — J189 Pneumonia, unspecified organism: Secondary | ICD-10-CM

## 2021-03-26 DIAGNOSIS — Z91199 Patient's noncompliance with other medical treatment and regimen due to unspecified reason: Secondary | ICD-10-CM | POA: Diagnosis not present

## 2021-03-26 DIAGNOSIS — Z7982 Long term (current) use of aspirin: Secondary | ICD-10-CM | POA: Diagnosis not present

## 2021-03-26 DIAGNOSIS — N1831 Chronic kidney disease, stage 3a: Secondary | ICD-10-CM | POA: Diagnosis not present

## 2021-03-26 DIAGNOSIS — K219 Gastro-esophageal reflux disease without esophagitis: Secondary | ICD-10-CM | POA: Diagnosis present

## 2021-03-26 DIAGNOSIS — M109 Gout, unspecified: Secondary | ICD-10-CM | POA: Diagnosis present

## 2021-03-26 DIAGNOSIS — D751 Secondary polycythemia: Secondary | ICD-10-CM | POA: Diagnosis present

## 2021-03-26 DIAGNOSIS — N138 Other obstructive and reflux uropathy: Secondary | ICD-10-CM | POA: Diagnosis present

## 2021-03-26 DIAGNOSIS — Z91128 Patient's intentional underdosing of medication regimen for other reason: Secondary | ICD-10-CM | POA: Diagnosis not present

## 2021-03-26 DIAGNOSIS — T383X6A Underdosing of insulin and oral hypoglycemic [antidiabetic] drugs, initial encounter: Secondary | ICD-10-CM | POA: Diagnosis present

## 2021-03-26 DIAGNOSIS — E860 Dehydration: Secondary | ICD-10-CM | POA: Diagnosis present

## 2021-03-26 DIAGNOSIS — U071 COVID-19: Secondary | ICD-10-CM | POA: Diagnosis not present

## 2021-03-26 DIAGNOSIS — I129 Hypertensive chronic kidney disease with stage 1 through stage 4 chronic kidney disease, or unspecified chronic kidney disease: Secondary | ICD-10-CM | POA: Diagnosis present

## 2021-03-26 DIAGNOSIS — G4700 Insomnia, unspecified: Secondary | ICD-10-CM | POA: Diagnosis present

## 2021-03-26 DIAGNOSIS — I10 Essential (primary) hypertension: Secondary | ICD-10-CM | POA: Diagnosis not present

## 2021-03-26 DIAGNOSIS — F32A Depression, unspecified: Secondary | ICD-10-CM | POA: Diagnosis present

## 2021-03-26 DIAGNOSIS — E1122 Type 2 diabetes mellitus with diabetic chronic kidney disease: Secondary | ICD-10-CM | POA: Diagnosis present

## 2021-03-26 DIAGNOSIS — E785 Hyperlipidemia, unspecified: Secondary | ICD-10-CM | POA: Diagnosis present

## 2021-03-26 DIAGNOSIS — E1165 Type 2 diabetes mellitus with hyperglycemia: Secondary | ICD-10-CM | POA: Diagnosis present

## 2021-03-26 DIAGNOSIS — Z9114 Patient's other noncompliance with medication regimen: Secondary | ICD-10-CM | POA: Diagnosis not present

## 2021-03-26 DIAGNOSIS — N401 Enlarged prostate with lower urinary tract symptoms: Secondary | ICD-10-CM | POA: Diagnosis present

## 2021-03-26 DIAGNOSIS — Z79899 Other long term (current) drug therapy: Secondary | ICD-10-CM | POA: Diagnosis not present

## 2021-03-26 DIAGNOSIS — J159 Unspecified bacterial pneumonia: Secondary | ICD-10-CM | POA: Diagnosis present

## 2021-03-26 DIAGNOSIS — H3530 Unspecified macular degeneration: Secondary | ICD-10-CM | POA: Diagnosis present

## 2021-03-26 HISTORY — DX: Pneumonia, unspecified organism: J18.9

## 2021-03-26 LAB — COMPREHENSIVE METABOLIC PANEL
ALT: 125 U/L — ABNORMAL HIGH (ref 0–44)
AST: 82 U/L — ABNORMAL HIGH (ref 15–41)
Albumin: 2.7 g/dL — ABNORMAL LOW (ref 3.5–5.0)
Alkaline Phosphatase: 201 U/L — ABNORMAL HIGH (ref 38–126)
Anion gap: 10 (ref 5–15)
BUN: 30 mg/dL — ABNORMAL HIGH (ref 8–23)
CO2: 27 mmol/L (ref 22–32)
Calcium: 9.3 mg/dL (ref 8.9–10.3)
Chloride: 102 mmol/L (ref 98–111)
Creatinine, Ser: 1.61 mg/dL — ABNORMAL HIGH (ref 0.61–1.24)
GFR, Estimated: 47 mL/min — ABNORMAL LOW (ref >60)
Glucose, Bld: 121 mg/dL — ABNORMAL HIGH (ref 70–99)
Potassium: 4.3 mmol/L (ref 3.5–5.1)
Sodium: 139 mmol/L (ref 135–145)
Total Bilirubin: 0.6 mg/dL (ref 0.3–1.2)
Total Protein: 6 g/dL — ABNORMAL LOW (ref 6.5–8.1)

## 2021-03-26 LAB — C-REACTIVE PROTEIN: CRP: 4.1 mg/dL — ABNORMAL HIGH (ref <1.0)

## 2021-03-26 LAB — GLUCOSE, CAPILLARY
Glucose-Capillary: 150 mg/dL — ABNORMAL HIGH (ref 70–99)
Glucose-Capillary: 241 mg/dL — ABNORMAL HIGH (ref 70–99)
Glucose-Capillary: 190 mg/dL — ABNORMAL HIGH (ref 70–99)
Glucose-Capillary: 291 mg/dL — ABNORMAL HIGH (ref 70–99)
Glucose-Capillary: 246 mg/dL — ABNORMAL HIGH (ref 70–99)

## 2021-03-26 LAB — CBC
HCT: 47.1 % (ref 39.0–52.0)
Hemoglobin: 15.7 g/dL (ref 13.0–17.0)
MCH: 29 pg (ref 26.0–34.0)
MCHC: 33.3 g/dL (ref 30.0–36.0)
MCV: 87.1 fL (ref 80.0–100.0)
Platelets: 145 10*3/uL — ABNORMAL LOW (ref 150–400)
RBC: 5.41 MIL/uL (ref 4.22–5.81)
RDW: 12.9 % (ref 11.5–15.5)
WBC: 3.9 10*3/uL — ABNORMAL LOW (ref 4.0–10.5)
nRBC: 0 % (ref 0.0–0.2)

## 2021-03-26 LAB — D-DIMER, QUANTITATIVE: D-Dimer, Quant: 0.83 ug/mL-FEU — ABNORMAL HIGH (ref 0.00–0.50)

## 2021-03-26 MED ORDER — SODIUM CHLORIDE 0.9 % IV SOLN
2.0000 g | INTRAVENOUS | Status: DC
  Administered 2021-03-26: 2 g via INTRAVENOUS
  Filled 2021-03-26: qty 20

## 2021-03-26 MED ORDER — ENOXAPARIN SODIUM 40 MG/0.4ML IJ SOSY
40.0000 mg | PREFILLED_SYRINGE | INTRAMUSCULAR | Status: DC
  Administered 2021-03-26: 40 mg via SUBCUTANEOUS
  Filled 2021-03-26: qty 0.4

## 2021-03-26 MED ORDER — AZITHROMYCIN 500 MG PO TABS
500.0000 mg | ORAL_TABLET | Freq: Every day | ORAL | Status: DC
  Administered 2021-03-26 – 2021-03-27 (×2): 500 mg via ORAL
  Filled 2021-03-26 (×2): qty 1

## 2021-03-26 MED ORDER — ALPRAZOLAM 0.25 MG PO TABS
0.2500 mg | ORAL_TABLET | Freq: Three times a day (TID) | ORAL | Status: DC | PRN
  Administered 2021-03-26 – 2021-03-27 (×2): 0.25 mg via ORAL
  Filled 2021-03-26 (×2): qty 1

## 2021-03-26 MED ORDER — AMLODIPINE BESYLATE 5 MG PO TABS
5.0000 mg | ORAL_TABLET | Freq: Every day | ORAL | Status: DC
  Administered 2021-03-26 – 2021-03-27 (×2): 5 mg via ORAL
  Filled 2021-03-26 (×2): qty 1

## 2021-03-26 NOTE — Progress Notes (Signed)
PROGRESS NOTE        PATIENT DETAILS Name: [name] Age: 66 y.o. Sex: male Date of Birth: 1956/02/11 Admit Date: 03/24/2021 Admitting Physician Orma Flaming, MD S[facility]:[id number], [name], [name]  Brief Narrative: Patient is a 66 y.o. male with history of DM-2, HTN, HLD, CKD stage IIIb, prostate cancer, depression-who was feeling weak/febrile/coughing for past 3 days-presented to the ED and was found to have COVID-19 infection with PNA.  See below for further details.   Subjective: Feels better-cough/shortness of breath is better.  Still weak.  Objective: Vitals: Blood pressure 136/84, pulse (!) 58, temperature 99.6 F (37.6 C), temperature source Oral, resp. rate 19, height 5\' 6"  (1.676 m), weight 75.7 kg, SpO2 100 %.   Exam: Gen Exam:Alert awake-not in any distress HEENT:atraumatic, normocephalic Chest: B/L clear to auscultation anteriorly CVS:S1S2 regular Abdomen:soft non tender, non distended Extremities:no edema Neurology: Non focal Skin: no rash  Pertinent Labs/Radiology: Recent Labs  Lab 03/26/21 0532  WBC 3.9*  HGB 15.7  PLT 145*  NA 139  K 4.3  CREATININE 1.61*  AST 82*  ALT 125*  ALKPHOS 201*  BILITOT 0.6    Assessment/Plan: PNA: Reviewed CXR on admission-appears to have a focal infiltrate on right upper lobe area-likely bacterial-and not consistent with COVID-19 PNA which is mostly groundglass opacities.  Clinically improving-continue IV antibiotics for another 24 hours before transitioning to oral antimicrobial therapy.  Blood cultures negative so far.  COVID-19 infection: Continue Paxlovid.  Unfortunately-he has only received 1 dose of a Moderna vaccine several years back.  AKI on CKD stage IIIb: AKI hemodynamically mediated in the setting of PNA/osmotic diuresis from hyperglycemia-renal function has significantly improved-and now close to baseline.  Avoid nephrotoxic agents.  Uncontrolled hyperglycemia (A1c 8.4 on 12/06/2020):  Sugars were more than 500 when he first presented-currently stable on 8 units of Levemir along with SSI.  Plan to resume usual home regimen on discharge.  Recent Labs    03/26/21 0646 03/26/21 0922 03/26/21 1142  GLUCAP 150* 241* 190*    HTN: BP creeping up-resume amlodipine-continue to hold olmesartan/HCTZ and metoprolol.  Marland Kitchen  HLD: Hold Lipitor as patient is on Paxlovid  Depression: Appears stable-apparently has not been taking his Abilify-this has not been resumed as patient has been started on Paxlovid  BMI Estimated body mass index is 26.94 kg/m as calculated from the following:   Height as of this encounter: 5\' 6"  (1.676 m).   Weight as of this encounter: 75.7 kg.    Procedures: None Consults: None DVT Prophylaxis: Lovenox Code Status:Full code  Family Communication: None at bedside  Time spent: 25 minutes-Greater than 50% of this time was spent in counseling, explanation of diagnosis, planning of further management, and coordination of care.   Disposition Plan: Status is: Observation  The patient will require care spanning > 2 midnights and should be moved to inpatient because: PNA/COVID-19 infection-with severe generalized weakness-needs ongoing IV antibiotics/PT eval before consideration of discharge.   Diet: Diet Order             Diet Carb Modified Fluid consistency: Thin; Room service appropriate? Yes  Diet effective now                     Antimicrobial agents: Anti-infectives (From admission, onward)    Start     Dose/Rate Route Frequency Ordered Stop  03/26/21 1515  cefTRIAXone (ROCEPHIN) 2 g in sodium chloride 0.9 % 100 mL IVPB        2 g 200 mL/hr over 30 Minutes Intravenous Every 24 hours 03/26/21 0939 03/30/21 1514   03/26/21 1200  azithromycin (ZITHROMAX) tablet 500 mg        500 mg Oral Daily 03/26/21 0937 03/29/21 1159   03/25/21 1515  cefTRIAXone (ROCEPHIN) 1 g in sodium chloride 0.9 % 100 mL IVPB  Status:  Discontinued        1  g 200 mL/hr over 30 Minutes Intravenous Every 24 hours 03/24/21 1513 03/26/21 0939   03/25/21 1445  azithromycin (ZITHROMAX) 500 mg in sodium chloride 0.9 % 250 mL IVPB  Status:  Discontinued        500 mg 250 mL/hr over 60 Minutes Intravenous Every 24 hours 03/24/21 1513 03/26/21 0937   03/24/21 2200  nirmatrelvir/ritonavir EUA (renal dosing) (PAXLOVID) 2 tablet        2 tablet Oral 2 times daily 03/24/21 1810 03/29/21 2159   03/24/21 1445  cefTRIAXone (ROCEPHIN) 1 g in sodium chloride 0.9 % 100 mL IVPB        1 g 200 mL/hr over 30 Minutes Intravenous  Once 03/24/21 1442 03/24/21 1631   03/24/21 1445  azithromycin (ZITHROMAX) 500 mg in sodium chloride 0.9 % 250 mL IVPB        500 mg 250 mL/hr over 60 Minutes Intravenous  Once 03/24/21 1442 03/24/21 2315        MEDICATIONS: Scheduled Meds:  albuterol  2 puff Inhalation Q6H   vitamin C  500 mg Oral Daily   aspirin EC  81 mg Oral Q breakfast   azithromycin  500 mg Oral Q1200   enoxaparin (LOVENOX) injection  30 mg Subcutaneous Q24H   insulin aspart  0-9 Units Subcutaneous TID WC   insulin detemir  8 Units Subcutaneous Daily   multivitamin with minerals  1 tablet Oral Daily   nirmatrelvir/ritonavir EUA (renal dosing)  2 tablet Oral BID   zinc sulfate  220 mg Oral Daily   Continuous Infusions:  cefTRIAXone (ROCEPHIN)  IV     PRN Meds:.acetaminophen **OR** acetaminophen, guaiFENesin-dextromethorphan, ondansetron **OR** ondansetron (ZOFRAN) IV   I have personally reviewed following labs and imaging studies  LABORATORY DATA: CBC: Recent Labs  Lab 03/24/21 1243 03/25/21 0649 03/26/21 0532  WBC 6.3 4.7 3.9*  HGB 17.3* 16.0 15.7  HCT 51.5 48.8 47.1  MCV 87.4 87.5 87.1  PLT 137* 133* 145*    Basic Metabolic Panel: Recent Labs  Lab 03/24/21 1243 03/25/21 0649 03/26/21 0532  NA 129* 134* 139  K 4.7 4.1 4.3  CL 96* 104 102  CO2 23 21* 27  GLUCOSE 531* 141* 121*  BUN 42* 31* 30*  CREATININE 2.05* 1.60* 1.61*   CALCIUM 8.8* 8.8* 9.3    GFR: Estimated Creatinine Clearance: 40.2 mL/min (A) (by C-G formula based on SCr of 1.61 mg/dL (H)).  Liver Function Tests: Recent Labs  Lab 03/26/21 0532  AST 82*  ALT 125*  ALKPHOS 201*  BILITOT 0.6  PROT 6.0*  ALBUMIN 2.7*   No results for input(s): LIPASE, AMYLASE in the last 168 hours. No results for input(s): AMMONIA in the last 168 hours.  Coagulation Profile: No results for input(s): INR, PROTIME in the last 168 hours.  Cardiac Enzymes: No results for input(s): CKTOTAL, CKMB, CKMBINDEX, TROPONINI in the last 168 hours.  BNP (last 3 results) No results for input(s): PROBNP in the  last 8760 hours.  Lipid Profile: No results for input(s): CHOL, HDL, LDLCALC, TRIG, CHOLHDL, LDLDIRECT in the last 72 hours.  Thyroid Function Tests: No results for input(s): TSH, T4TOTAL, FREET4, T3FREE, THYROIDAB in the last 72 hours.  Anemia Panel: Recent Labs    03/24/21 0013  FERRITIN 300    Urine analysis:    Component Value Date/Time   COLORURINE YELLOW 03/24/2021 1243   APPEARANCEUR CLEAR 03/24/2021 1243   APPEARANCEUR Clear 12/12/2020 1046   LABSPEC 1.010 03/24/2021 1243   PHURINE 5.5 03/24/2021 1243   GLUCOSEU >=500 (A) 03/24/2021 1243   HGBUR SMALL (A) 03/24/2021 1243   BILIRUBINUR NEGATIVE 03/24/2021 1243   BILIRUBINUR Negative 12/12/2020 1046   KETONESUR NEGATIVE 03/24/2021 1243   PROTEINUR 30 (A) 03/24/2021 1243   UROBILINOGEN 0.2 08/26/2019 1516   UROBILINOGEN 0.2 04/18/2008 1153   NITRITE NEGATIVE 03/24/2021 1243   LEUKOCYTESUR NEGATIVE 03/24/2021 1243    Sepsis Labs: Lactic Acid, Venous    Component Value Date/Time   LATICACIDVEN 1.1 10/02/2019 1933    MICROBIOLOGY: Recent Results (from the past 240 hour(s))  Resp Panel by RT-PCR (Flu A&B, Covid) Nasopharyngeal Swab     Status: Abnormal   Collection Time: 03/24/21  1:14 PM   Specimen: Nasopharyngeal Swab; Nasopharyngeal(NP) swabs in vial transport medium  Result  Value Ref Range Status   SARS Coronavirus 2 by RT PCR POSITIVE (A) NEGATIVE Final    Comment: (NOTE) SARS-CoV-2 target nucleic acids are DETECTED.  The SARS-CoV-2 RNA is generally detectable in upper respiratory specimens during the acute phase of infection. Positive results are indicative of the presence of the identified virus, but do not rule out bacterial infection or co-infection with other pathogens not detected by the test. Clinical correlation with patient history and other diagnostic information is necessary to determine patient infection status. The expected result is Negative.  Fact Sheet for Patients: EntrepreneurPulse.com.au  Fact Sheet for Healthcare Providers: IncredibleEmployment.be  This test is not yet approved or cleared by the [place] FDA and  has been authorized for detection and/or diagnosis of SARS-CoV-2 by FDA under an Emergency Use Authorization (EUA).  This EUA will remain in effect (meaning this test can be used) for the duration of  the COVID-19 declaration under Section 564(b)(1) of the A ct, 21 U.S.C. section 360bbb-3(b)(1), unless the authorization is terminated or revoked sooner.     Influenza A by PCR NEGATIVE NEGATIVE Final   Influenza B by PCR NEGATIVE NEGATIVE Final    Comment: (NOTE) The Xpert Xpress SARS-CoV-2/FLU/RSV plus assay is intended as an aid in the diagnosis of influenza from Nasopharyngeal swab specimens and should not be used as a sole basis for treatment. Nasal washings and aspirates are unacceptable for Xpert Xpress SARS-CoV-2/FLU/RSV testing.  Fact Sheet for Patients: EntrepreneurPulse.com.au  Fact Sheet for Healthcare Providers: IncredibleEmployment.be  This test is not yet approved or cleared by the [place] FDA and has been authorized for detection and/or diagnosis of SARS-CoV-2 by FDA under an Emergency Use Authorization (EUA). This EUA  will remain in effect (meaning this test can be used) for the duration of the COVID-19 declaration under Section 564(b)(1) of the Act, 21 U.S.C. section 360bbb-3(b)(1), unless the authorization is terminated or revoked.  Performed at [facility], [place] [address], [place], [place] [address]   Respiratory (~20 pathogens) panel by PCR     Status: None   Collection Time: 03/24/21  3:02 PM   Specimen: Nasopharyngeal Swab; Respiratory  Result Value Ref Range  Status   Adenovirus NOT DETECTED NOT DETECTED Final   Coronavirus 229E NOT DETECTED NOT DETECTED Final    Comment: (NOTE) The Coronavirus on the Respiratory Panel, DOES NOT test for the novel  Coronavirus (2019 nCoV)    Coronavirus HKU1 NOT DETECTED NOT DETECTED Final   Coronavirus NL63 NOT DETECTED NOT DETECTED Final   Coronavirus OC43 NOT DETECTED NOT DETECTED Final   Metapneumovirus NOT DETECTED NOT DETECTED Final   Rhinovirus / Enterovirus NOT DETECTED NOT DETECTED Final   Influenza A NOT DETECTED NOT DETECTED Final   Influenza B NOT DETECTED NOT DETECTED Final   Parainfluenza Virus 1 NOT DETECTED NOT DETECTED Final   Parainfluenza Virus 2 NOT DETECTED NOT DETECTED Final   Parainfluenza Virus 3 NOT DETECTED NOT DETECTED Final   Parainfluenza Virus 4 NOT DETECTED NOT DETECTED Final   Respiratory Syncytial Virus NOT DETECTED NOT DETECTED Final   Bordetella pertussis NOT DETECTED NOT DETECTED Final   Bordetella Parapertussis NOT DETECTED NOT DETECTED Final   Chlamydophila pneumoniae NOT DETECTED NOT DETECTED Final   Mycoplasma pneumoniae NOT DETECTED NOT DETECTED Final    Comment: Performed at [facility], [place] [address], [place], [place] [address]  Culture, blood (routine x 2)     Status: None (Preliminary result)   Collection Time: 03/24/21  5:00 PM   Specimen: BLOOD RIGHT HAND  Result Value Ref Range Status   Specimen Description BLOOD RIGHT HAND  Final   Special Requests   Final    BOTTLES DRAWN AEROBIC AND  ANAEROBIC Blood Culture adequate volume   Culture   Final    NO GROWTH 2 DAYS Performed at [facility], [place] [address] [place], [place] [address]    Report Status PENDING  Incomplete  Culture, blood (routine x 2)     Status: None (Preliminary result)   Collection Time: 03/24/21  5:11 PM   Specimen: BLOOD RIGHT ARM  Result Value Ref Range Status   Specimen Description BLOOD RIGHT ARM  Final   Special Requests   Final    BOTTLES DRAWN AEROBIC AND ANAEROBIC Blood Culture results may not be optimal due to an inadequate volume of blood received in culture bottles   Culture   Final    NO GROWTH 2 DAYS Performed at [facility], [place] [address] [place], [place] [address]    Report Status PENDING  Incomplete    RADIOLOGY STUDIES/RESULTS: DG Chest 2 View  Result Date: 03/24/2021 CLINICAL DATA:  Cough for several days. EXAM: CHEST - 2 VIEW COMPARISON:  01/04/2021. FINDINGS: Cardiac silhouette is normal in size. Normal mediastinal and hilar contours. Subtle airspace opacity suggested in the suprahilar right upper lobe. Remainder of the lungs is clear. No pleural effusion or pneumothorax. Skeletal structures are intact. IMPRESSION: 1. Subtle opacity in the central right upper lobe, somewhat equivocal, but suspicious for pneumonia given the patient's symptoms. No other evidence of acute cardiopulmonary disease. Electronically Signed   By: Lajean Manes M.D.   On: 03/24/2021 14:08   DG Chest Port 1V same Day  Result Date: 03/25/2021 CLINICAL DATA:  66 year old male with cough and fever. EXAM: PORTABLE CHEST 1 VIEW COMPARISON:  Chest radiographs 03/24/2021 and earlier. FINDINGS: Portable AP semi upright view at 0719 hours. Normal lung volumes and mediastinal contours. Visualized tracheal air column is within normal limits. Regressed asymmetric vague right upper lung opacity and lung markings now appear to be at baseline. Right nipple shadow. No pneumothorax or pleural effusion. No  acute  osseous abnormality identified. Paucity of bowel gas in the upper abdomen. IMPRESSION: Regressed vague asymmetric right upper lung opacity. No acute cardiopulmonary abnormality now. Electronically Signed   By: Genevie Ann M.D.   On: 03/25/2021 07:30     LOS: 0 days   Oren Binet, MD  [facility]    To contact the attending provider between 7A-7P or the covering provider during after hours 7P-7A, please log into the web site [email or web address] and access using universal [place] password for that web site. If you do not have the password, please call the hospital operator.  03/26/2021, 11:46 AM

## 2021-03-26 NOTE — Plan of Care (Signed)
  Problem: Activity: Goal: Ability to tolerate increased activity will improve Outcome: Progressing   Problem: Respiratory: Goal: Ability to maintain adequate ventilation will improve Outcome: Progressing   

## 2021-03-26 NOTE — Plan of Care (Signed)
  Problem: Activity: Goal: Ability to tolerate increased activity will improve Outcome: Progressing   

## 2021-03-26 NOTE — Care Management Obs Status (Signed)
[place] NOTIFICATION   Patient Details  Name: [name] MRN: [id number] Date of Birth: [birth year]   Medicare Observation Status Notification Given:  Yes    Tom-Johnson, Renea Ee, RN 03/26/2021, 10:07 AM

## 2021-03-26 NOTE — TOC Initial Note (Signed)
Transition of Care [facility]) - Initial/Assessment Note    Patient Details  Name: [name] MRN: [id number] Date of Birth: [birth year]  Transition of Care [facility]) CM/SW Contact:    Tom-Johnson, Renea Ee, RN Phone Number: 03/26/2021, 4:36 PM  Clinical Narrative:                 CM spoke with patient at bedside about needs for post hospital transition. Patient states he lives alone at home. Does not have any children and no immediate family lives close to assist him. Independent with care and drives self prior to hospitalization. PT/OT recommended home health with bedside commode and rolling walker. Home health referral made with [facility] and [facility] voiced acceptance. Bedside commode and walker ordered from [place] to deliver at bedside. CM will continue to follow with needs.  Expected Discharge Plan: [place] Barriers to Discharge: Continued Medical Work up   Patient Goals and CMS Choice Patient states their goals for this hospitalization and ongoing recovery are:: To return home CMS Medicare.gov Compare Post Acute Care list provided to:: Patient Choice offered to / list presented to : Patient  Expected Discharge Plan and Services Expected Discharge Plan: [place]   Discharge Planning Services: CM Consult Post Acute Care Choice: [place] arrangements for the past 2 months: Apartment                 DME Arranged: Bedside commode, Walker rolling DME Agency: [employer] Date DME Agency Contacted: 03/26/21 Time DME Agency Contacted: [phone] Representative spoke with at DME Agency: Freda Munro HH Arranged: PT, OT [place] Agency: [place] Date [place]: 03/26/21 Time [place]: 33 Representative spoke with at [place]: Tommi Rumps  Prior Living Arrangements/Services Living arrangements for the past 2 months: Apartment Lives with:: Self Patient language and need for interpreter reviewed:: Yes Do you  feel safe going back to the place where you live?: Yes      Need for Family Participation in Patient Care: Yes (Comment) Care giver support system in place?: Yes (comment) Current home services: DME Sales executive chair) Criminal Activity/Legal Involvement Pertinent to Current Situation/Hospitalization: No - Comment as needed  Activities of Daily Living Home Assistive Devices/Equipment: None ADL Screening (condition at time of admission) Patient's cognitive ability adequate to safely complete daily activities?: Yes Is the patient deaf or have difficulty hearing?: No Does the patient have difficulty seeing, even when wearing glasses/contacts?: No Does the patient have difficulty concentrating, remembering, or making decisions?: No Patient able to express need for assistance with ADLs?: Yes Does the patient have difficulty dressing or bathing?: No Independently performs ADLs?: Yes (appropriate for developmental age) Does the patient have difficulty walking or climbing stairs?: No Weakness of Legs: None Weakness of Arms/Hands: None  Permission Sought/Granted Permission sought to share information with : Case Manager, Customer service manager, Family Supports Permission granted to share information with : Yes, Verbal Permission Granted              Emotional Assessment Appearance:: Appears stated age Attitude/Demeanor/Rapport: Engaged, Gracious Affect (typically observed): Accepting, Appropriate, Calm, Hopeful Orientation: : Oriented to Self, Oriented to Place, Oriented to  Time, Oriented to Situation Alcohol / Substance Use: Not Applicable Psych Involvement: No (comment)  Admission diagnosis:  Dehydration [E86.0] SOB (shortness of breath) [R06.02] Community acquired pneumonia [J18.9] Hyperglycemia [R73.9] Pneumonia of right upper lobe due to infectious organism [J18.9] PNA (pneumonia) [J18.9] Patient Active Problem List  Diagnosis Date Noted   PNA (pneumonia) 03/26/2021    History of prostate cancer with recurrent urinary obstruction  03/24/2021   Polycythemia 03/24/2021   Community acquired pneumonia 03/24/2021   COVID-19 virus infection 03/24/2021   History of noncompliance with medical treatment 03/24/2021   Type 2 diabetes mellitus ([place]) 01/05/2021   HLD (hyperlipidemia) 01/05/2021   Acute urinary retention 10/25/2020   Adjustment disorder with mixed anxiety and depressed mood    Acute renal failure superimposed on stage 3a chronic kidney disease ([place]) 09/15/2020   Ataxia 08/01/2020   Hydroureteronephrosis 02/10/2020   Nausea and vomiting 02/10/2020   Pneumonia due to COVID-19 virus 09/29/2019   Hyponatremia 09/28/2019   Sinus tachycardia 09/28/2019   Insomnia 08/26/2018   Nephropathy 03/26/2018   Urinary urgency 03/26/2018   Type 2 diabetes mellitus with diabetic nephropathy, without long-term current use of insulin ([place]) 03/26/2018   Essential hypertension 03/26/2018   Chronic kidney disease (CKD), stage III (moderate) ([place]) 03/26/2018   Conductive hearing loss, bilateral 11/07/2017   Uncontrolled type 2 diabetes mellitus with hyperglycemia ([place]) 07/31/2016   Microcytic anemia 02/17/2012   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 Pharmacy:   [id number] - [place], [place] [address] Phone: [phone] Fax: [phone]     Social Determinants of Health ([place]) Interventions    Readmission Risk Interventions Readmission Risk Prevention Plan 10/05/2019  Transportation Screening Complete  PCP or Specialist Appt within 5-7 Days Complete  Home Care Screening Complete  Medication Review (RN CM) Complete  Some recent data might be hidden

## 2021-03-27 ENCOUNTER — Other Ambulatory Visit (HOSPITAL_COMMUNITY): Payer: Self-pay

## 2021-03-27 DIAGNOSIS — N401 Enlarged prostate with lower urinary tract symptoms: Secondary | ICD-10-CM

## 2021-03-27 LAB — GLUCOSE, CAPILLARY
Glucose-Capillary: 157 mg/dL — ABNORMAL HIGH (ref 70–99)
Glucose-Capillary: 156 mg/dL — ABNORMAL HIGH (ref 70–99)
Glucose-Capillary: 231 mg/dL — ABNORMAL HIGH (ref 70–99)

## 2021-03-27 MED ORDER — SILDENAFIL CITRATE 20 MG PO TABS: 20.0000 mg | ORAL_TABLET | Freq: Every day | ORAL | 0 refills | Status: DC | PRN

## 2021-03-27 MED ORDER — CEFDINIR 300 MG PO CAPS
300.0000 mg | ORAL_CAPSULE | Freq: Two times a day (BID) | ORAL | 0 refills | Status: AC
Start: 2021-03-27 — End: 2021-03-29
  Filled 2021-03-27: qty 4, 2d supply, fill #0

## 2021-03-27 MED ORDER — ARIPIPRAZOLE 2 MG PO TABS: 2.0000 mg | ORAL_TABLET | Freq: Every day | ORAL | Status: DC

## 2021-03-27 MED ORDER — FINASTERIDE 5 MG PO TABS: 5.0000 mg | ORAL_TABLET | Freq: Every day | ORAL | 0 refills | Status: DC

## 2021-03-27 MED ORDER — ALFUZOSIN HCL ER 10 MG PO TB24: 10.0000 mg | ORAL_TABLET | Freq: Every day | ORAL | 6 refills | Status: DC

## 2021-03-27 MED ORDER — TRAZODONE HCL 50 MG PO TABS: 50.0000 mg | ORAL_TABLET | Freq: Every evening | ORAL | 2 refills | Status: DC | PRN

## 2021-03-27 MED ORDER — ATORVASTATIN CALCIUM 40 MG PO TABS: 40.0000 mg | ORAL_TABLET | Freq: Every day | ORAL | Status: DC

## 2021-03-27 MED ORDER — NIRMATRELVIR/RITONAVIR (PAXLOVID) TABLET (RENAL DOSING): 2.0000 | ORAL_TABLET | Freq: Two times a day (BID) | ORAL | 0 refills | Status: AC

## 2021-03-27 NOTE — Consult Note (Signed)
[facility] Inpatient Consult   03/27/2021  [name] 04/09/1955 [id number]  [facility] CM)   Patient chart reviewed due to extreme high risk score for unplanned readmission. Per review, patient is currently active with chronic care management team at primary provider office. Patient followed for chronic disease management and care coordination services.  Plan: Will update community CCM team of patient disposition for home.  Of note, [facility] Care Management services does not replace or interfere with any services that are arranged by inpatient case management or social work.   Netta Cedars, MSN, RN [facility] [employer] [phone]  Toll free office [phone]

## 2021-03-27 NOTE — TOC Transition Note (Signed)
Transition of Care [facility]) - CM/SW Discharge Note   Patient Details  Name: ALDAIR RICKEL MRN: [id number] Date of Birth: 19-Nov-1955  Transition of Care [facility] CM/SW Contact:  Tom-Johnson, Renea Ee, RN Phone Number: 03/27/2021, 10:29 AM   Clinical Narrative:     Patient is scheduled for discharge today. Home health PT/OT with [facility]. Patient declines rolling walker and bedside commode. [place] to transport at discharge. No further TOC needs noted.  Final next level of care: [place] Barriers to Discharge: Barriers Resolved   Patient Goals and CMS Choice Patient states their goals for this hospitalization and ongoing recovery are:: To return home CMS Medicare.gov Compare Post Acute Care list provided to:: Patient Choice offered to / list presented to : Patient  Discharge Placement                       Discharge Plan and Services   Discharge Planning Services: CM Consult Post Acute Care Choice: Home Health          DME Arranged: Bedside commode, Walker rolling DME Agency: [employer] Date DME Agency Contacted: 03/26/21 Time DME Agency Contacted: [phone] Representative spoke with at DME Agency: Freda Munro HH Arranged: PT, OT [place] Agency: [place] Date [place]: 03/26/21 Time [place]: [place] Representative spoke with at [place]: [place] ([place]) Interventions     Readmission Risk Interventions Readmission Risk Prevention Plan 10/05/2019  Transportation Screening Complete  PCP or Specialist Appt within 5-7 Days Complete  Home Care Screening Complete  Medication Review (RN CM) Complete  Some recent data might be hidden

## 2021-03-27 NOTE — Consult Note (Signed)
[facility] Inpatient Consult   03/27/2021  [name] May 11, 1955 [id number]  [facility] Follow Up:  1pm: Message received from CCM team member at [facility]. Patient not being followed by chronic care management team at this time.  Will continue to follow for progression.  Netta Cedars, MSN, RN [facility] [employer] [phone]  Toll free office [phone]

## 2021-03-27 NOTE — Discharge Summary (Signed)
PATIENT DETAILS Name: [name] Age: 66 y.o. Sex: male Date of Birth: 10-11-1955 MRN: [id number]. Admitting Physician: Jonetta Osgood, MD PCP:No primary care provider on file.  Admit Date: 03/24/2021 Discharge date: 03/27/2021  Recommendations for Outpatient Follow-up:  Follow up with PCP in 1-2 weeks Please obtain CMP/CBC in one week Please repeat two-view chest x-ray in 4 to 6 weeks to document resolution of PNA. Follow blood cultures until final.  Admitted From:  Home  Disposition: Home with home health services   [place]:  Yes  Equipment/Devices: None  Discharge Condition: Stable  CODE STATUS: FULL CODE  Diet recommendation:  Diet Order             Diet - Black sodium heart healthy           Diet general           Diet Carb Modified Fluid consistency: Thin; Room service appropriate? Yes  Diet effective now                    Brief Summary: Patient is a 66 y.o. male with history of DM-2, HTN, HLD, CKD stage IIIb, prostate cancer, depression-who was feeling weak/febrile/coughing for past 3 days-presented to the ED and was found to have COVID-19 infection with PNA.  See below for further details.   Brief Hospital Course: PNA: Reviewed CXR on admission-appears to have a focal infiltrate on right upper lobe area-likely bacterial-and not consistent with COVID-19 PNA which is mostly groundglass opacities.  Treated with IV antibiotics with significant clinical improvement-culture data negative so far-we will transition to oral antimicrobial therapy for a few more days on discharge.    COVID-19 infection: See above-treated with Paxlovid with- will continue Paxlovid for a total of 5 days.Unfortunately-he has only received 1 dose of a Moderna vaccine several years back.  Mild transaminitis: Probably due to COVID-19 infection-please repeat complete metabolic panel in 1-2 weeks to ensure improvement.   AKI on CKD stage IIIb: AKI hemodynamically mediated in  the setting of PNA/osmotic diuresis from hyperglycemia-renal function has significantly improved-and now close to baseline.  Avoid nephrotoxic agents.   Uncontrolled hyperglycemia (A1c 8.4 on 12/06/2020): Sugars were more than 500 when he first presented-currently stable on 8 units of Levemir along with SSI.  Plan to resume usual home regimen on discharge.  HTN: BP stable with just amlodipine--continue to hold olmesartan/HCTZ/metoprolol-follow with primary care practitioner and resume accordingly.   HLD: Hold Lipitor as patient is on Paxlovid   Depression: Appears stable-apparently has not been taking his Abilify-this has not been resumed as patient has been started on Paxlovid.  Okay to resume once he has completed a course of Paxlovid   BMI Estimated body mass index is 26.94 kg/m as calculated from the following:   Height as of this encounter: 5\' 6"  (1.676 m).   Weight as of this encounter: 75.7 kg.    Procedures None  Discharge Diagnoses:  Principal Problem:   Community acquired pneumonia Active Problems:   Depressive disorder   Uncontrolled type 2 diabetes mellitus with hyperglycemia ([place])   Essential hypertension   Insomnia   Acute renal failure superimposed on stage 3a chronic kidney disease (HCC)   HLD (hyperlipidemia)   History of prostate cancer with recurrent urinary obstruction    Polycythemia   COVID-19 virus infection   History of noncompliance with medical treatment   PNA (pneumonia)   Discharge Instructions:  Activity:  As tolerated with Full fall precautions use walker/cane &  assistance as needed  Discharge Instructions     Call MD for:  difficulty breathing, headache or visual disturbances   Complete by: As directed    Diet - Black sodium heart healthy   Complete by: As directed    Diet general   Complete by: As directed    Discharge instructions   Complete by: As directed    Follow with Primary MD in 1-2 weeks  Please get a complete blood count and  chemistry panel checked by your Primary MD at your next visit, and again as instructed by your Primary MD.  Get Medicines reviewed and adjusted: Please take all your medications with you for your next visit with your Primary MD  Laboratory/radiological data: Please request your Primary MD to go over all hospital tests and procedure/radiological results at the follow up, please ask your Primary MD to get all Hospital records sent to his/her office.  In some cases, they will be blood work, cultures and biopsy results pending at the time of your discharge. Please request that your primary care M.D. follows up on these results.  Also Note the following: If you experience worsening of your admission symptoms, develop shortness of breath, life threatening emergency, suicidal or homicidal thoughts you must seek medical attention immediately by calling 911 or calling your MD immediately  if symptoms less severe.  You must read complete instructions/literature along with all the possible adverse reactions/side effects for all the Medicines you take and that have been prescribed to you. Take any new Medicines after you have completely understood and accpet all the possible adverse reactions/side effects.   Do not drive when taking Pain medications or sleeping medications (Benzodaizepines)  Do not take more than prescribed Pain, Sleep and Anxiety Medications. It is not advisable to combine anxiety,sleep and pain medications without talking with your primary care practitioner  Special Instructions: If you have smoked or chewed Tobacco  in the last 2 yrs please stop smoking, stop any regular Alcohol  and or any Recreational drug use.  Wear Seat belts while driving.  Please note: You were cared for by a hospitalist during your hospital stay. Once you are discharged, your primary care physician will handle any further medical issues. Please note that NO REFILLS for any discharge medications will be  authorized once you are discharged, as it is imperative that you return to your primary care physician (or establish a relationship with a primary care physician if you do not have one) for your post hospital discharge needs so that they can reassess your need for medications and monitor your lab values.   Please ask your primary care practitioner to repeat two-view chest x-ray in 4 to 6 weeks to document resolution of pneumonia   Increase activity slowly   Complete by: As directed       Allergies as of 03/27/2021       Reactions   Haldol [haloperidol]    Per family cause life threatening complications to patient        Medication List     STOP taking these medications    clotrimazole-betamethasone cream Commonly known as: LOTRISONE   diclofenac Sodium 1 % Gel Commonly known as: Voltaren   metoprolol tartrate 50 MG tablet Commonly known as: LOPRESSOR   nystatin powder Commonly known as: nystatin   olmesartan-hydrochlorothiazide 40-25 MG tablet Commonly known as: BENICAR HCT   pantoprazole 40 MG tablet Commonly known as: PROTONIX       TAKE these medications  acetaminophen 325 MG tablet Commonly known as: TYLENOL Take 2 tablets (650 mg total) by mouth every 6 (six) hours as needed for mild pain (or Fever >/= 101).   alfuzosin 10 MG 24 hr tablet Commonly known as: UROXATRAL Take 1 tablet (10 mg total) by mouth daily with breakfast. Start taking on: March 30, 2021 What changed: These instructions start on March 30, 2021. If you are unsure what to do until then, ask your doctor or other care provider.   amLODipine 5 MG tablet Commonly known as: NORVASC Take 1 tablet (5 mg total) by mouth daily.   ARIPiprazole 2 MG tablet Commonly known as: ABILIFY Take 1 tablet (2 mg total) by mouth daily. Start taking on: March 30, 2021 What changed: These instructions start on March 30, 2021. If you are unsure what to do until then, ask your doctor or other care  provider.   aspirin EC 81 MG tablet Take 81 mg by mouth daily with breakfast.   atorvastatin 40 MG tablet Commonly known as: LIPITOR Take 1 tablet (40 mg total) by mouth daily. Start taking on: March 30, 2021 What changed:  how much to take how to take this when to take this additional instructions These instructions start on March 30, 2021. If you are unsure what to do until then, ask your doctor or other care provider.   cefdinir 300 MG capsule Commonly known as: OMNICEF Take 1 capsule (300 mg total) by mouth 2 (two) times daily for 2 days.   finasteride 5 MG tablet Commonly known as: PROSCAR Take 1 tablet (5 mg total) by mouth daily. Start taking on: March 30, 2021 What changed: These instructions start on March 30, 2021. If you are unsure what to do until then, ask your doctor or other care provider.   multivitamin with minerals Tabs tablet Take 1 tablet by mouth daily.   nirmatrelvir/ritonavir EUA (renal dosing) 10 x 150 MG & 10 x 100MG  Tabs Commonly known as: PAXLOVID Take 2 tablets by mouth 2 (two) times daily for 2 days. Patient GFR is 47 Take nirmatrelvir (150 mg) one tablet twice daily for 5 days and ritonavir (100 mg) one tablet twice daily for 5 days-last day would be on 1/12.   Rybelsus 14 MG Tabs Generic drug: Semaglutide Take 1 tablet by mouth daily. Take 30 minutes before breakfast   sildenafil 20 MG tablet Commonly known as: REVATIO Take 1 tablet (20 mg total) by mouth daily as needed. Start taking on: March 30, 2021 What changed: These instructions start on March 30, 2021. If you are unsure what to do until then, ask your doctor or other care provider.   traZODone 50 MG tablet Commonly known as: DESYREL Take 1 tablet (50 mg total) by mouth at bedtime as needed for sleep. Start taking on: March 30, 2021 What changed: These instructions start on March 30, 2021. If you are unsure what to do until then, ask your doctor or other care  provider.   VISINE OP Place 1 drop into both eyes daily as needed (itching/irritation).        Follow-up Information     Primary care practitioner. Schedule an appointment as soon as possible for a visit in 1 week(s).   Why: Repeat Liver Function Tests, Hospital follow up               Allergies  Allergen Reactions   Haldol [Haloperidol]     Per family cause life threatening complications to patient  Consultations:  None   Other Procedures/Studies: DG Chest 2 View  Result Date: 03/24/2021 CLINICAL DATA:  Cough for several days. EXAM: CHEST - 2 VIEW COMPARISON:  01/04/2021. FINDINGS: Cardiac silhouette is normal in size. Normal mediastinal and hilar contours. Subtle airspace opacity suggested in the suprahilar right upper lobe. Remainder of the lungs is clear. No pleural effusion or pneumothorax. Skeletal structures are intact. IMPRESSION: 1. Subtle opacity in the central right upper lobe, somewhat equivocal, but suspicious for pneumonia given the patient's symptoms. No other evidence of acute cardiopulmonary disease. Electronically Signed   By: Lajean Manes M.D.   On: 03/24/2021 14:08   DG Chest Port 1V same Day  Result Date: 03/25/2021 CLINICAL DATA:  66 year old male with cough and fever. EXAM: PORTABLE CHEST 1 VIEW COMPARISON:  Chest radiographs 03/24/2021 and earlier. FINDINGS: Portable AP semi upright view at 0719 hours. Normal lung volumes and mediastinal contours. Visualized tracheal air column is within normal limits. Regressed asymmetric vague right upper lung opacity and lung markings now appear to be at baseline. Right nipple shadow. No pneumothorax or pleural effusion. No acute osseous abnormality identified. Paucity of bowel gas in the upper abdomen. IMPRESSION: Regressed vague asymmetric right upper lung opacity. No acute cardiopulmonary abnormality now. Electronically Signed   By: Genevie Ann M.D.   On: 03/25/2021 07:30     TODAY-DAY OF  DISCHARGE:  Subjective:   [name] today has no headache,no chest abdominal pain,no new weakness tingling or numbness, feels much better wants to go home today.   Objective:   Blood pressure (!) 136/92, pulse 62, temperature 98 F (36.7 C), temperature source Oral, resp. rate 17, height 5\' 6"  (1.676 m), weight 75.7 kg, SpO2 100 %.  Intake/Output Summary (Last 24 hours) at 03/27/2021 1004 Last data filed at 03/27/2021 0700 Gross per 24 hour  Intake 1562 ml  Output 1925 ml  Net -363 ml   Filed Weights   03/25/21 0803 03/25/21 1631  Weight: 79.8 kg 75.7 kg    Exam: Awake Alert, Oriented *3, No new F.N deficits, Normal affect [place].AT,PERRAL Supple Neck,No JVD, No cervical lymphadenopathy appriciated.  Symmetrical Chest wall movement, Good air movement bilaterally, CTAB RRR,No Gallops,Rubs or new Murmurs, No Parasternal Heave +ve B.Sounds, Abd Soft, Non tender, No organomegaly appriciated, No rebound -guarding or rigidity. No Cyanosis, Clubbing or edema, No new Rash or bruise   PERTINENT RADIOLOGIC STUDIES: No results found.   PERTINENT LAB RESULTS: CBC: Recent Labs    03/25/21 0649 03/26/21 0532  WBC 4.7 3.9*  HGB 16.0 15.7  HCT 48.8 47.1  PLT 133* 145*   CMET CMP     Component Value Date/Time   NA 139 03/26/2021 0532   NA 139 12/06/2020 1550   K 4.3 03/26/2021 0532   CL 102 03/26/2021 0532   CO2 27 03/26/2021 0532   GLUCOSE 121 (H) 03/26/2021 0532   BUN 30 (H) 03/26/2021 0532   BUN 45 (H) 12/06/2020 1550   CREATININE 1.61 (H) 03/26/2021 0532   CALCIUM 9.3 03/26/2021 0532   PROT 6.0 (L) 03/26/2021 0532   PROT 6.8 07/17/2020 1450   ALBUMIN 2.7 (L) 03/26/2021 0532   ALBUMIN 4.2 07/17/2020 1450   AST 82 (H) 03/26/2021 0532   ALT 125 (H) 03/26/2021 0532   ALKPHOS 201 (H) 03/26/2021 0532   BILITOT 0.6 03/26/2021 0532   BILITOT 0.6 07/17/2020 1450   GFRNONAA 47 (L) 03/26/2021 0532   GFRAA 48 (L) 03/13/2020 1139    GFR Estimated Creatinine Clearance:  40.2 mL/min (A) (by C-G formula based on SCr of 1.61 mg/dL (H)). No results for input(s): LIPASE, AMYLASE in the last 72 hours. No results for input(s): CKTOTAL, CKMB, CKMBINDEX, TROPONINI in the last 72 hours. Invalid input(s): POCBNP Recent Labs    03/25/21 0730 03/26/21 0532  DDIMER 0.86* 0.83*   No results for input(s): HGBA1C in the last 72 hours. No results for input(s): CHOL, HDL, LDLCALC, TRIG, CHOLHDL, LDLDIRECT in the last 72 hours. No results for input(s): TSH, T4TOTAL, T3FREE, THYROIDAB in the last 72 hours.  Invalid input(s): FREET3 No results for input(s): VITAMINB12, FOLATE, FERRITIN, TIBC, IRON, RETICCTPCT in the last 72 hours. Coags: No results for input(s): INR in the last 72 hours.  Invalid input(s): PT Microbiology: Recent Results (from the past 240 hour(s))  Resp Panel by RT-PCR (Flu A&B, Covid) Nasopharyngeal Swab     Status: Abnormal   Collection Time: 03/24/21  1:14 PM   Specimen: Nasopharyngeal Swab; Nasopharyngeal(NP) swabs in vial transport medium  Result Value Ref Range Status   SARS Coronavirus 2 by RT PCR POSITIVE (A) NEGATIVE Final    Comment: (NOTE) SARS-CoV-2 target nucleic acids are DETECTED.  The SARS-CoV-2 RNA is generally detectable in upper respiratory specimens during the acute phase of infection. Positive results are indicative of the presence of the identified virus, but do not rule out bacterial infection or co-infection with other pathogens not detected by the test. Clinical correlation with patient history and other diagnostic information is necessary to determine patient infection status. The expected result is Negative.  Fact Sheet for Patients: EntrepreneurPulse.com.au  Fact Sheet for Healthcare Providers: IncredibleEmployment.be  This test is not yet approved or cleared by the [place] FDA and  has been authorized for detection and/or diagnosis of SARS-CoV-2 by FDA under an Emergency  Use Authorization (EUA).  This EUA will remain in effect (meaning this test can be used) for the duration of  the COVID-19 declaration under Section 564(b)(1) of the A ct, 21 U.S.C. section 360bbb-3(b)(1), unless the authorization is terminated or revoked sooner.     Influenza A by PCR NEGATIVE NEGATIVE Final   Influenza B by PCR NEGATIVE NEGATIVE Final    Comment: (NOTE) The Xpert Xpress SARS-CoV-2/FLU/RSV plus assay is intended as an aid in the diagnosis of influenza from Nasopharyngeal swab specimens and should not be used as a sole basis for treatment. Nasal washings and aspirates are unacceptable for Xpert Xpress SARS-CoV-2/FLU/RSV testing.  Fact Sheet for Patients: EntrepreneurPulse.com.au  Fact Sheet for Healthcare Providers: IncredibleEmployment.be  This test is not yet approved or cleared by the [place] FDA and has been authorized for detection and/or diagnosis of SARS-CoV-2 by FDA under an Emergency Use Authorization (EUA). This EUA will remain in effect (meaning this test can be used) for the duration of the COVID-19 declaration under Section 564(b)(1) of the Act, 21 U.S.C. section 360bbb-3(b)(1), unless the authorization is terminated or revoked.  Performed at [facility], [place] [address], [place], [place] [address]   Respiratory (~20 pathogens) panel by PCR     Status: None   Collection Time: 03/24/21  3:02 PM   Specimen: Nasopharyngeal Swab; Respiratory  Result Value Ref Range Status   Adenovirus NOT DETECTED NOT DETECTED Final   Coronavirus 229E NOT DETECTED NOT DETECTED Final    Comment: (NOTE) The Coronavirus on the Respiratory Panel, DOES NOT test for the novel  Coronavirus (2019 nCoV)    Coronavirus HKU1 NOT DETECTED NOT DETECTED Final   Coronavirus NL63  NOT DETECTED NOT DETECTED Final   Coronavirus OC43 NOT DETECTED NOT DETECTED Final   Metapneumovirus NOT DETECTED NOT DETECTED Final   Rhinovirus /  Enterovirus NOT DETECTED NOT DETECTED Final   Influenza A NOT DETECTED NOT DETECTED Final   Influenza B NOT DETECTED NOT DETECTED Final   Parainfluenza Virus 1 NOT DETECTED NOT DETECTED Final   Parainfluenza Virus 2 NOT DETECTED NOT DETECTED Final   Parainfluenza Virus 3 NOT DETECTED NOT DETECTED Final   Parainfluenza Virus 4 NOT DETECTED NOT DETECTED Final   Respiratory Syncytial Virus NOT DETECTED NOT DETECTED Final   Bordetella pertussis NOT DETECTED NOT DETECTED Final   Bordetella Parapertussis NOT DETECTED NOT DETECTED Final   Chlamydophila pneumoniae NOT DETECTED NOT DETECTED Final   Mycoplasma pneumoniae NOT DETECTED NOT DETECTED Final    Comment: Performed at [facility], [place] [address], [place], [place] [address]  Culture, blood (routine x 2)     Status: None (Preliminary result)   Collection Time: 03/24/21  5:00 PM   Specimen: BLOOD RIGHT HAND  Result Value Ref Range Status   Specimen Description BLOOD RIGHT HAND  Final   Special Requests   Final    BOTTLES DRAWN AEROBIC AND ANAEROBIC Blood Culture adequate volume   Culture   Final    NO GROWTH 3 DAYS Performed at [facility], [place] [address], [place], [place] [address]    Report Status PENDING  Incomplete  Culture, blood (routine x 2)     Status: None (Preliminary result)   Collection Time: 03/24/21  5:11 PM   Specimen: BLOOD RIGHT ARM  Result Value Ref Range Status   Specimen Description BLOOD RIGHT ARM  Final   Special Requests   Final    BOTTLES DRAWN AEROBIC AND ANAEROBIC Blood Culture results may not be optimal due to an inadequate volume of blood received in culture bottles   Culture   Final    NO GROWTH 3 DAYS Performed at [facility], [place] [address], [place], [place] [address]    Report Status PENDING  Incomplete    FURTHER DISCHARGE INSTRUCTIONS:  Get Medicines reviewed and adjusted: Please take all your medications with you for your next visit with your Primary  MD  Laboratory/radiological data: Please request your Primary MD to go over all hospital tests and procedure/radiological results at the follow up, please ask your Primary MD to get all Hospital records sent to his/her office.  In some cases, they will be blood work, cultures and biopsy results pending at the time of your discharge. Please request that your primary care M.D. goes through all the records of your hospital data and follows up on these results.  Also Note the following: If you experience worsening of your admission symptoms, develop shortness of breath, life threatening emergency, suicidal or homicidal thoughts you must seek medical attention immediately by calling 911 or calling your MD immediately  if symptoms less severe.  You must read complete instructions/literature along with all the possible adverse reactions/side effects for all the Medicines you take and that have been prescribed to you. Take any new Medicines after you have completely understood and accpet all the possible adverse reactions/side effects.   Do not drive when taking Pain medications or sleeping medications (Benzodaizepines)  Do not take more than prescribed Pain, Sleep and Anxiety Medications. It is not advisable to combine anxiety,sleep and pain medications without talking with your primary care practitioner  Special Instructions: If you have  smoked or chewed Tobacco  in the last 2 yrs please stop smoking, stop any regular Alcohol  and or any Recreational drug use.  Wear Seat belts while driving.  Please note: You were cared for by a hospitalist during your hospital stay. Once you are discharged, your primary care physician will handle any further medical issues. Please note that NO REFILLS for any discharge medications will be authorized once you are discharged, as it is imperative that you return to your primary care physician (or establish a relationship with a primary care physician if you do not have  one) for your post hospital discharge needs so that they can reassess your need for medications and monitor your lab values.  Total Time spent coordinating discharge including counseling, education and face to face time equals 45 minutes.  SignedOren Binet 03/27/2021 10:04 AM

## 2021-03-27 NOTE — Progress Notes (Signed)
DISCHARGE NOTE HOME NESBIT MICHON to be discharged Home per MD order. Discussed prescriptions and follow up appointments with the patient. Prescriptions given to patient; medication list explained in detail. Patient verbalized understanding.  Skin clean, dry and intact without evidence of skin break down, no evidence of skin tears noted. IV catheter discontinued intact. Site without signs and symptoms of complications. Dressing and pressure applied. Pt denies pain at the site currently. No complaints noted.  Patient free of lines, drains, and wounds.   An After Visit Summary (AVS) was printed and given to the patient. Patient escorted via wheelchair, and discharged home via private auto.  [name], RN

## 2021-03-27 NOTE — Plan of Care (Signed)
?  Problem: Activity: ?Goal: Ability to tolerate increased activity will improve ?Outcome: Progressing ?  ?Problem: Clinical Measurements: ?Goal: Ability to maintain a body temperature in the normal range will improve ?Outcome: Progressing ?  ?Problem: Respiratory: ?Goal: Ability to maintain a clear airway will improve ?Outcome: Progressing ?  ?

## 2021-03-29 LAB — CULTURE, BLOOD (ROUTINE X 2)
Culture: NO GROWTH
Culture: NO GROWTH
Special Requests: ADEQUATE

## 2021-04-09 ENCOUNTER — Encounter (HOSPITAL_COMMUNITY): Payer: Self-pay

## 2021-04-09 ENCOUNTER — Emergency Department (HOSPITAL_COMMUNITY)
Admission: EM | Admit: 2021-04-09 | Discharge: 2021-04-09 | Disposition: A | Discharge status: Home / Self Care | Payer: Medicare Other | Attending: Emergency Medicine | Admitting: Emergency Medicine

## 2021-04-09 ENCOUNTER — Emergency Department (HOSPITAL_COMMUNITY): Payer: Medicare Other

## 2021-04-09 ENCOUNTER — Other Ambulatory Visit: Payer: Self-pay

## 2021-04-09 DIAGNOSIS — I700 Atherosclerosis of aorta: Secondary | ICD-10-CM | POA: Diagnosis not present

## 2021-04-09 DIAGNOSIS — R110 Nausea: Secondary | ICD-10-CM | POA: Diagnosis not present

## 2021-04-09 DIAGNOSIS — R1011 Right upper quadrant pain: Secondary | ICD-10-CM | POA: Diagnosis not present

## 2021-04-09 DIAGNOSIS — R1031 Right lower quadrant pain: Secondary | ICD-10-CM | POA: Diagnosis not present

## 2021-04-09 DIAGNOSIS — Z7982 Long term (current) use of aspirin: Secondary | ICD-10-CM | POA: Insufficient documentation

## 2021-04-09 LAB — COMPREHENSIVE METABOLIC PANEL
ALT: 25 U/L (ref 0–44)
AST: 17 U/L (ref 15–41)
Albumin: 4.1 g/dL (ref 3.5–5.0)
Alkaline Phosphatase: 105 U/L (ref 38–126)
Anion gap: 8 (ref 5–15)
BUN: 35 mg/dL — ABNORMAL HIGH (ref 8–23)
CO2: 25 mmol/L (ref 22–32)
Calcium: 9.4 mg/dL (ref 8.9–10.3)
Chloride: 103 mmol/L (ref 98–111)
Creatinine, Ser: 1.7 mg/dL — ABNORMAL HIGH (ref 0.61–1.24)
GFR, Estimated: 44 mL/min — ABNORMAL LOW (ref >60)
Glucose, Bld: 170 mg/dL — ABNORMAL HIGH (ref 70–99)
Potassium: 4.1 mmol/L (ref 3.5–5.1)
Sodium: 136 mmol/L (ref 135–145)
Total Bilirubin: 1.4 mg/dL — ABNORMAL HIGH (ref 0.3–1.2)
Total Protein: 7.6 g/dL (ref 6.5–8.1)

## 2021-04-09 LAB — URINALYSIS, ROUTINE W REFLEX MICROSCOPIC
Bacteria, UA: NONE SEEN
Bilirubin Urine: NEGATIVE
Glucose, UA: 50 mg/dL — AB
Hgb urine dipstick: NEGATIVE
Ketones, ur: NEGATIVE mg/dL
Leukocytes,Ua: NEGATIVE
Nitrite: NEGATIVE
Protein, ur: 30 mg/dL — AB
Specific Gravity, Urine: 1.042 — ABNORMAL HIGH (ref 1.005–1.030)
pH: 5 (ref 5.0–8.0)

## 2021-04-09 LAB — LIPASE, BLOOD: Lipase: 27 U/L (ref 11–51)

## 2021-04-09 LAB — CBC
HCT: 52.9 % — ABNORMAL HIGH (ref 39.0–52.0)
Hemoglobin: 17.7 g/dL — ABNORMAL HIGH (ref 13.0–17.0)
MCH: 29.4 pg (ref 26.0–34.0)
MCHC: 33.5 g/dL (ref 30.0–36.0)
MCV: 87.7 fL (ref 80.0–100.0)
Platelets: 250 10*3/uL (ref 150–400)
RBC: 6.03 MIL/uL — ABNORMAL HIGH (ref 4.22–5.81)
RDW: 13.3 % (ref 11.5–15.5)
WBC: 8.8 10*3/uL (ref 4.0–10.5)
nRBC: 0 % (ref 0.0–0.2)

## 2021-04-09 MED ORDER — ONDANSETRON HCL 4 MG/2ML IJ SOLN
4.0000 mg | Freq: Once | INTRAMUSCULAR | Status: AC
  Administered 2021-04-09: 4 mg via INTRAVENOUS
  Filled 2021-04-09: qty 2

## 2021-04-09 MED ORDER — MORPHINE SULFATE (PF) 4 MG/ML IV SOLN
4.0000 mg | Freq: Once | INTRAVENOUS | Status: AC
Start: 2021-04-09 — End: 2021-04-09
  Administered 2021-04-09: 4 mg via INTRAVENOUS
  Filled 2021-04-09: qty 1

## 2021-04-09 MED ORDER — DICYCLOMINE HCL 20 MG PO TABS: 20.0000 mg | ORAL_TABLET | Freq: Two times a day (BID) | ORAL | 0 refills | Status: DC

## 2021-04-09 MED ORDER — SODIUM CHLORIDE 0.9 % IV BOLUS
1000.0000 mL | Freq: Once | INTRAVENOUS | Status: AC
  Administered 2021-04-09: 1000 mL via INTRAVENOUS

## 2021-04-09 MED ORDER — ONDANSETRON 4 MG PO TBDP: ORAL_TABLET | ORAL | 0 refills | Status: DC

## 2021-04-09 MED ORDER — IOHEXOL 300 MG/ML  SOLN
100.0000 mL | Freq: Once | INTRAMUSCULAR | Status: AC | PRN
  Administered 2021-04-09: 80 mL via INTRAVENOUS

## 2021-04-09 MED ORDER — SODIUM CHLORIDE (PF) 0.9 % IJ SOLN
INTRAMUSCULAR | Status: AC
  Filled 2021-04-09: qty 50

## 2021-04-09 NOTE — Discharge Instructions (Addendum)
Your work-up here did not show any acute finding.  This does not mean that nothing is wrong.  Sometimes when your symptoms have lasted longer than a month it is difficult for [place] to find out with wrong with you in the emergency department.  Please follow-up with your family doctor in the office.  Return for worsening pain fever or inability to eat or drink.

## 2021-04-09 NOTE — ED Triage Notes (Signed)
Pt reports generalized abdominal pain and weight loss for a few weeks. He also endorses loss of appetite and nausea over the past 5 days. He reports being admitted for pneumonia and hyperglycemia earlier this month. Denies V/D.

## 2021-04-09 NOTE — ED Provider Notes (Signed)
[facility] DEPT Provider Note   CSN: [id number] Arrival date & time: 04/09/21  1045     History  Chief Complaint  Patient presents with   Abdominal Pain    [name] is a 66 y.o. male.  66 yo M with a chief complaint of right lower quadrant abdominal discomfort.  He tells me this been going on for many months but is worsened over the past few days.  Having trouble eating and drinking.  Denies fevers or chills.  Denies diarrhea or constipation.  He has had some nausea with this.  Was most recently seen in the hospital diagnosed with pneumonia.  He feels like he is recovered from this.  Feels like there are 2 different problems.  The history is provided by the patient.  Abdominal Pain Pain location:  RLQ and RUQ Pain quality: aching and sharp   Pain radiates to:  Does not radiate Pain severity:  Moderate Onset quality:  Gradual Duration:  16 weeks Timing:  Intermittent Progression:  Waxing and waning Chronicity:  Recurrent Relieved by:  Nothing Worsened by:  Eating Ineffective treatments:  None tried Associated symptoms: nausea   Associated symptoms: no chest pain, no chills, no diarrhea, no fever, no shortness of breath and no vomiting       Home Medications Prior to Admission medications   Medication Sig Start Date End Date Taking? Authorizing Provider  dicyclomine (BENTYL) 20 MG tablet Take 1 tablet (20 mg total) by mouth 2 (two) times daily. 04/09/21  Yes Deno Etienne, DO  ondansetron (ZOFRAN-ODT) 4 MG disintegrating tablet 4mg  ODT q4 hours prn nausea/vomit 04/09/21  Yes Deno Etienne, DO  acetaminophen (TYLENOL) 325 MG tablet Take 2 tablets (650 mg total) by mouth every 6 (six) hours as needed for mild pain (or Fever >/= 101). 02/12/20   Domenic Polite, MD  alfuzosin (UROXATRAL) 10 MG 24 hr tablet Take 1 tablet (10 mg total) by mouth daily with breakfast. 03/30/21   Ghimire, Henreitta Leber, MD  amLODipine (NORVASC) 5 MG tablet Take 1 tablet (5  mg total) by mouth daily. 01/07/21   Regalado, Belkys A, MD  ARIPiprazole (ABILIFY) 2 MG tablet Take 1 tablet (2 mg total) by mouth daily. 03/30/21   Ghimire, Henreitta Leber, MD  aspirin EC 81 MG tablet Take 81 mg by mouth daily with breakfast.     [provider]  atorvastatin (LIPITOR) 40 MG tablet Take 1 tablet (40 mg total) by mouth daily. 03/30/21   Ghimire, Henreitta Leber, MD  finasteride (PROSCAR) 5 MG tablet Take 1 tablet (5 mg total) by mouth daily. 03/30/21   Ghimire, Henreitta Leber, MD  Multiple Vitamin (MULTIVITAMIN WITH MINERALS) TABS tablet Take 1 tablet by mouth daily.    [provider]  Semaglutide (RYBELSUS) 14 MG TABS Take 1 tablet by mouth daily. Take 30 minutes before breakfast 12/06/20   [name], [name]  sildenafil (REVATIO) 20 MG tablet Take 1 tablet (20 mg total) by mouth daily as needed. 03/30/21   Ghimire, Henreitta Leber, MD  Tetrahydrozoline HCl (VISINE OP) Place 1 drop into both eyes daily as needed (itching/irritation).    [provider]  traZODone (DESYREL) 50 MG tablet Take 1 tablet (50 mg total) by mouth at bedtime as needed for sleep. 03/30/21   Ghimire, Henreitta Leber, MD      Allergies    Haldol [haloperidol]    Review of Systems   Review of Systems  Constitutional:  Negative for chills and fever.  HENT:  Negative for congestion and facial swelling.   Eyes:  Negative for discharge and visual disturbance.  Respiratory:  Negative for shortness of breath.   Cardiovascular:  Negative for chest pain and palpitations.  Gastrointestinal:  Positive for abdominal pain and nausea. Negative for diarrhea and vomiting.  Musculoskeletal:  Negative for arthralgias and myalgias.  Skin:  Negative for color change and rash.  Neurological:  Negative for tremors, syncope and headaches.  Psychiatric/Behavioral:  Negative for confusion and dysphoric mood.    Physical Exam Updated Vital Signs BP 126/87    Pulse 74    Temp 98.5 F (36.9 C) (Oral)    Resp 18    Ht 5\' 6"   (1.676 m)    Wt 78.9 kg    SpO2 98%    BMI 28.08 kg/m  Physical Exam Vitals and nursing note reviewed.  Constitutional:      Appearance: He is well-developed.  HENT:     Head: Normocephalic and atraumatic.  Eyes:     Pupils: Pupils are equal, round, and reactive to light.  Neck:     Vascular: No JVD.  Cardiovascular:     Rate and Rhythm: Normal rate and regular rhythm.     Heart sounds: No murmur heard.   No friction rub. No gallop.  Pulmonary:     Effort: No respiratory distress.     Breath sounds: No wheezing.  Abdominal:     General: There is no distension.     Tenderness: There is abdominal tenderness. There is guarding. There is no rebound.     Comments: Large midline abdominal scar.  Diffuse abdominal tenderness with guarding.  Musculoskeletal:        General: Normal range of motion.     Cervical back: Normal range of motion and neck supple.  Skin:    Coloration: Skin is not pale.     Findings: No rash.  Neurological:     Mental Status: He is alert and oriented to person, place, and time.  Psychiatric:        Behavior: Behavior normal.    ED Results / Procedures / Treatments   Labs (all labs ordered are listed, but only abnormal results are displayed) Labs Reviewed  COMPREHENSIVE METABOLIC PANEL - Abnormal; Notable for the following components:      Result Value   Glucose, Bld 170 (*)    BUN 35 (*)    Creatinine, Ser 1.70 (*)    Total Bilirubin 1.4 (*)    GFR, Estimated 44 (*)    All other components within normal limits  CBC - Abnormal; Notable for the following components:   RBC 6.03 (*)    Hemoglobin 17.7 (*)    HCT 52.9 (*)    All other components within normal limits  URINALYSIS, ROUTINE W REFLEX MICROSCOPIC - Abnormal; Notable for the following components:   Specific Gravity, Urine 1.042 (*)    Glucose, UA 50 (*)    Protein, ur 30 (*)    All other components within normal limits  LIPASE, BLOOD    EKG None  Radiology CT ABDOMEN PELVIS W  CONTRAST  Result Date: 04/09/2021 CLINICAL DATA:  Right lower quadrant pain, nausea, and weight loss for several weeks. Personal history of prostate carcinoma and prior right ureteral reimplantation. EXAM: CT ABDOMEN AND PELVIS WITH CONTRAST TECHNIQUE: Multidetector CT imaging of the abdomen and pelvis was performed using the standard protocol following bolus administration of intravenous contrast. RADIATION DOSE REDUCTION: This exam was performed according  to the departmental dose-optimization program which includes automated exposure control, adjustment of the mA and/or kV according to patient size and/or use of iterative reconstruction technique. CONTRAST:  31mL OMNIPAQUE IOHEXOL 300 MG/ML  SOLN COMPARISON:  01/04/2021 FINDINGS: Lower Chest: No acute findings. Hepatobiliary: No hepatic masses identified. A few tiny sub-cm calcified gallstones are noted. No evidence of cholecystitis or biliary ductal dilatation. Pancreas:  No mass or inflammatory changes. Spleen: Within normal limits in size and appearance. Adrenals/Urinary Tract: Normal adrenal glands. Several renal cysts are again seen bilaterally. No renal masses are identified. Diffuse right renal parenchymal atrophy is again seen. Stable right hydroureteronephrosis is again demonstrated with stable postop changes from right ureteral reimplantation. Diffuse bladder wall thickening is stable, likely due to chronic bladder outlet obstruction given enlarged prostate. Stomach/Bowel: No evidence of obstruction, inflammatory process or abnormal fluid collections. Normal appendix visualized. Vascular/Lymphatic: No pathologically enlarged lymph nodes. No acute vascular findings. Aortic atherosclerotic calcification noted. Reproductive:  Stable moderately enlarged prostate. Other:  Stable small right inguinal hernia which contains only fat. Musculoskeletal:  No suspicious bone lesions identified. IMPRESSION: No acute findings. Stable chronic right renal atrophy and  hydroureteronephrosis. Stable postop changes from right ureteral reimplantation. Stable moderately enlarged prostate and findings of chronic bladder outlet obstruction. No evidence of metastatic disease Cholelithiasis. No radiographic evidence of cholecystitis. Aortic Atherosclerosis (ICD10-I70.0). Electronically Signed   By: Marlaine Hind M.D.   On: 04/09/2021 13:11    Procedures Procedures    Medications Ordered in ED Medications  sodium chloride 0.9 % bolus 1,000 mL (0 mLs Intravenous Stopped 04/09/21 1546)  morphine 4 MG/ML injection 4 mg (4 mg Intravenous Given 04/09/21 1213)  ondansetron (ZOFRAN) injection 4 mg (4 mg Intravenous Given 04/09/21 1210)  iohexol (OMNIPAQUE) 300 MG/ML solution 100 mL (80 mLs Intravenous Contrast Given 04/09/21 1246)  sodium chloride (PF) 0.9 % injection (  Given by Other 04/09/21 1358)  morphine 4 MG/ML injection 4 mg (4 mg Intravenous Given 04/09/21 1451)  ondansetron (ZOFRAN) injection 4 mg (4 mg Intravenous Given 04/09/21 1450)    ED Course/ Medical Decision Making/ A&P                           Medical Decision Making Amount and/or Complexity of Data Reviewed Labs: ordered. Radiology: ordered.  Risk Prescription drug management.   66 yo M with a chief complaint to right lower quadrant abdominal discomfort.  Going on for months but worsening over the past few days.  On my exam he has diffuse abdominal discomfort with guarding.  Blood work without significant finding.  No LFT elevation no significant leukocytosis.  Will obtain a CT scan.  Treat pain and nausea.  IV narcotics.  LFTs unremarkable lipase negative.  No significant electrolyte abnormality.  No leukocytosis no anemia.  Patient with some ongoing pain on repeat assessment.  CT scan is negative for acute intra-abdominal pathology.  Awaiting UA.  UA is resulted and is unremarkable for infection.  Will discharge home.  PCP follow-up.  4:05 PM:  I have discussed the diagnosis/risks/treatment  options with the patient and believe the pt to be eligible for discharge home to follow-up with PCP. We also discussed returning to the ED immediately if new or worsening sx occur. We discussed the sx which are most concerning (e.g., sudden worsening pain, fever, inability to tolerate by mouth) that necessitate immediate return. Medications administered to the patient during their visit and any new prescriptions provided to the  patient are listed below.  Medications given during this visit Medications  sodium chloride 0.9 % bolus 1,000 mL (0 mLs Intravenous Stopped 04/09/21 1546)  morphine 4 MG/ML injection 4 mg (4 mg Intravenous Given 04/09/21 1213)  ondansetron (ZOFRAN) injection 4 mg (4 mg Intravenous Given 04/09/21 1210)  iohexol (OMNIPAQUE) 300 MG/ML solution 100 mL (80 mLs Intravenous Contrast Given 04/09/21 1246)  sodium chloride (PF) 0.9 % injection (  Given by Other 04/09/21 1358)  morphine 4 MG/ML injection 4 mg (4 mg Intravenous Given 04/09/21 1451)  ondansetron (ZOFRAN) injection 4 mg (4 mg Intravenous Given 04/09/21 1450)     The patient appears reasonably screen and/or stabilized for discharge and I doubt any other medical condition or other [facility] requiring further screening, evaluation, or treatment in the ED at this time prior to discharge.          Final Clinical Impression(s) / ED Diagnoses Final diagnoses:  Right lower quadrant abdominal pain    Rx / DC Orders ED Discharge Orders          Ordered    dicyclomine (BENTYL) 20 MG tablet  2 times daily        04/09/21 1501    ondansetron (ZOFRAN-ODT) 4 MG disintegrating tablet        04/09/21 1501              Deno Etienne, DO 04/09/21 1605

## 2021-04-09 NOTE — ED Notes (Signed)
Safe transport called for transport needs.  

## 2021-04-09 NOTE — ED Provider Triage Note (Signed)
Emergency Medicine Provider Triage Evaluation Note  DHEERAJ HAIL , a 66 y.o. male  was evaluated in triage.  Pt complains of abdominal pain.  Reports pain is located to periumbilical region as well as bilateral lower quadrants.  States that pain has been present for approximately 1 year however has gotten worse recently.  Patient also endorses decreased p.o. intake and nausea.  Denies any illicit drug use or alcohol use.  Review of Systems  Positive: Abdominal pain, nausea, decreased appetite, chills Negative: Fever, dysuria, hematuria, urinary urgency, swelling or tenderness to genitals, constipation, diarrhea  Physical Exam  BP (!) 114/93 (BP Location: Left Arm)    Pulse 93    Temp 98.5 F (36.9 C) (Oral)    Resp 12    SpO2 96%  Gen:   Awake, no distress   Resp:  Normal effort  MSK:   Moves extremities without difficulty  Other:  Abdomen soft, nondistended, tenderness to lower quadrant bilaterally.  No guarding or rebound tenderness.  Medical Decision Making  Medically screening exam initiated at 11:20 AM.  Appropriate orders placed.  TAKAO LIZER was informed that the remainder of the evaluation will be completed by another provider, this initial triage assessment does not replace that evaluation, and the importance of remaining in the ED until their evaluation is complete.     Loni Beckwith, [place] 04/09/21 1121

## 2021-04-10 ENCOUNTER — Emergency Department (HOSPITAL_COMMUNITY)
Admission: EM | Admit: 2021-04-10 | Discharge: 2021-04-10 | Disposition: A | Discharge status: Home / Self Care | Payer: Medicare Other | Attending: Emergency Medicine | Admitting: Emergency Medicine

## 2021-04-10 ENCOUNTER — Other Ambulatory Visit: Payer: Self-pay

## 2021-04-10 ENCOUNTER — Encounter (HOSPITAL_COMMUNITY): Payer: Self-pay

## 2021-04-10 DIAGNOSIS — R5383 Other fatigue: Secondary | ICD-10-CM | POA: Diagnosis not present

## 2021-04-10 DIAGNOSIS — R109 Unspecified abdominal pain: Secondary | ICD-10-CM | POA: Diagnosis not present

## 2021-04-10 DIAGNOSIS — Z5321 Procedure and treatment not carried out due to patient leaving prior to being seen by health care provider: Secondary | ICD-10-CM | POA: Insufficient documentation

## 2021-04-10 DIAGNOSIS — R112 Nausea with vomiting, unspecified: Secondary | ICD-10-CM | POA: Insufficient documentation

## 2021-04-10 DIAGNOSIS — Z20822 Contact with and (suspected) exposure to covid-19: Secondary | ICD-10-CM | POA: Insufficient documentation

## 2021-04-10 DIAGNOSIS — R630 Anorexia: Secondary | ICD-10-CM | POA: Insufficient documentation

## 2021-04-10 LAB — CBC WITH DIFFERENTIAL/PLATELET
Abs Immature Granulocytes: 0.06 10*3/uL (ref 0.00–0.07)
Basophils Absolute: 0 10*3/uL (ref 0.0–0.1)
Basophils Relative: 0 %
Eosinophils Absolute: 0.1 10*3/uL (ref 0.0–0.5)
Eosinophils Relative: 1 %
HCT: 54.6 % — ABNORMAL HIGH (ref 39.0–52.0)
Hemoglobin: 18.1 g/dL — ABNORMAL HIGH (ref 13.0–17.0)
Immature Granulocytes: 1 %
Lymphocytes Relative: 11 %
Lymphs Abs: 1 10*3/uL (ref 0.7–4.0)
MCH: 29.3 pg (ref 26.0–34.0)
MCHC: 33.2 g/dL (ref 30.0–36.0)
MCV: 88.5 fL (ref 80.0–100.0)
Monocytes Absolute: 0.7 10*3/uL (ref 0.1–1.0)
Monocytes Relative: 8 %
Neutro Abs: 7.2 10*3/uL (ref 1.7–7.7)
Neutrophils Relative %: 79 %
Platelets: 263 10*3/uL (ref 150–400)
RBC: 6.17 MIL/uL — ABNORMAL HIGH (ref 4.22–5.81)
RDW: 13.6 % (ref 11.5–15.5)
WBC: 9.1 10*3/uL (ref 4.0–10.5)
nRBC: 0 % (ref 0.0–0.2)

## 2021-04-10 LAB — COMPREHENSIVE METABOLIC PANEL
ALT: 20 U/L (ref 0–44)
AST: 17 U/L (ref 15–41)
Albumin: 4.3 g/dL (ref 3.5–5.0)
Alkaline Phosphatase: 105 U/L (ref 38–126)
Anion gap: 12 (ref 5–15)
BUN: 35 mg/dL — ABNORMAL HIGH (ref 8–23)
CO2: 25 mmol/L (ref 22–32)
Calcium: 9.7 mg/dL (ref 8.9–10.3)
Chloride: 100 mmol/L (ref 98–111)
Creatinine, Ser: 1.77 mg/dL — ABNORMAL HIGH (ref 0.61–1.24)
GFR, Estimated: 42 mL/min — ABNORMAL LOW (ref >60)
Glucose, Bld: 129 mg/dL — ABNORMAL HIGH (ref 70–99)
Potassium: 4.4 mmol/L (ref 3.5–5.1)
Sodium: 137 mmol/L (ref 135–145)
Total Bilirubin: 1.4 mg/dL — ABNORMAL HIGH (ref 0.3–1.2)
Total Protein: 8.1 g/dL (ref 6.5–8.1)

## 2021-04-10 LAB — RESP PANEL BY RT-PCR (FLU A&B, COVID) ARPGX2
Influenza A by PCR: NEGATIVE
Influenza B by PCR: NEGATIVE
SARS Coronavirus 2 by RT PCR: NEGATIVE

## 2021-04-10 LAB — LIPASE, BLOOD: Lipase: 39 U/L (ref 11–51)

## 2021-04-10 NOTE — ED Provider Triage Note (Signed)
Emergency Medicine Provider Triage Evaluation Note  [name] , a 66 y.o. male  was evaluated in triage.  Pt complains of of abdominal pain, decreased appetite, nausea, vomiting, and fatigue.  Patient states "I am real sick."  Patient states that his fatigue has gotten worse since being seen in the emergency department yesterday.  Patient denies any change in abdominal pain from when he was assessed yesterday.  Review of Systems  Positive: Chills, generalized abdominal pain, nausea, vomiting, fatigue, decreased p.o. intake Negative: Fever, shortness of breath, difficulty breathing, chest pain  Physical Exam  BP 121/80    Pulse 89    Temp 97.7 F (36.5 C) (Oral)    Resp 18    Ht 5\' 6"  (1.676 m)    Wt 78.9 kg    SpO2 97%    BMI 28.08 kg/m  Gen:   Awake, no distress   Resp:  Normal effort, lungs clear to auscultation bilaterally MSK:   Moves extremities without difficulty  Other:  Abdomen protuberant, soft, mild generalized tenderness throughout abdomen.  Medical Decision Making  Medically screening exam initiated at 11:24 AM.  Appropriate orders placed.  KANNAN PROIA was informed that the remainder of the evaluation will be completed by another provider, this initial triage assessment does not replace that evaluation, and the importance of remaining in the ED until their evaluation is complete.     Loni Beckwith, [place] 04/10/21 1125

## 2021-04-10 NOTE — ED Triage Notes (Signed)
Patient c/o abdominal pain, loss of appetite, and fatigue x 7-8 days. Patient denies V/D. Patient was seen yesterday for the same.

## 2021-04-11 ENCOUNTER — Emergency Department (HOSPITAL_COMMUNITY)
Admission: EM | Admit: 2021-04-11 | Discharge: 2021-04-11 | Disposition: A | Discharge status: Home / Self Care | Payer: Medicare Other | Attending: Emergency Medicine | Admitting: Emergency Medicine

## 2021-04-11 DIAGNOSIS — Z743 Need for continuous supervision: Secondary | ICD-10-CM | POA: Diagnosis not present

## 2021-04-11 DIAGNOSIS — R1111 Vomiting without nausea: Secondary | ICD-10-CM | POA: Diagnosis not present

## 2021-04-11 DIAGNOSIS — K5900 Constipation, unspecified: Secondary | ICD-10-CM | POA: Insufficient documentation

## 2021-04-11 DIAGNOSIS — Z5321 Procedure and treatment not carried out due to patient leaving prior to being seen by health care provider: Secondary | ICD-10-CM | POA: Diagnosis not present

## 2021-04-11 DIAGNOSIS — R109 Unspecified abdominal pain: Secondary | ICD-10-CM | POA: Insufficient documentation

## 2021-04-11 DIAGNOSIS — R110 Nausea: Secondary | ICD-10-CM | POA: Diagnosis not present

## 2021-04-11 NOTE — ED Triage Notes (Signed)
EMS stated, pt has had abdominal  for 2 weeks and constipated for 7-8 days . Pt has had vomiting for 3 days unable to eat.

## 2021-04-11 NOTE — ED Notes (Signed)
Called pt 3x for triage, no response

## 2021-04-12 ENCOUNTER — Encounter (HOSPITAL_COMMUNITY): Payer: Self-pay

## 2021-04-12 ENCOUNTER — Emergency Department (HOSPITAL_COMMUNITY)
Admission: EM | Admit: 2021-04-12 | Discharge: 2021-04-12 | Disposition: A | Discharge status: Home / Self Care | Payer: Medicare Other | Attending: Emergency Medicine | Admitting: Emergency Medicine

## 2021-04-12 ENCOUNTER — Other Ambulatory Visit: Payer: Self-pay

## 2021-04-12 DIAGNOSIS — R109 Unspecified abdominal pain: Secondary | ICD-10-CM | POA: Insufficient documentation

## 2021-04-12 DIAGNOSIS — Z5321 Procedure and treatment not carried out due to patient leaving prior to being seen by health care provider: Secondary | ICD-10-CM | POA: Diagnosis not present

## 2021-04-12 DIAGNOSIS — R1110 Vomiting, unspecified: Secondary | ICD-10-CM | POA: Diagnosis not present

## 2021-04-12 LAB — CBC
HCT: 54.2 % — ABNORMAL HIGH (ref 39.0–52.0)
Hemoglobin: 18.3 g/dL — ABNORMAL HIGH (ref 13.0–17.0)
MCH: 29.4 pg (ref 26.0–34.0)
MCHC: 33.8 g/dL (ref 30.0–36.0)
MCV: 87 fL (ref 80.0–100.0)
Platelets: 254 10*3/uL (ref 150–400)
RBC: 6.23 MIL/uL — ABNORMAL HIGH (ref 4.22–5.81)
RDW: 13.4 % (ref 11.5–15.5)
WBC: 7.9 10*3/uL (ref 4.0–10.5)
nRBC: 0 % (ref 0.0–0.2)

## 2021-04-12 LAB — COMPREHENSIVE METABOLIC PANEL
ALT: 19 U/L (ref 0–44)
AST: 19 U/L (ref 15–41)
Albumin: 4.2 g/dL (ref 3.5–5.0)
Alkaline Phosphatase: 103 U/L (ref 38–126)
Anion gap: 14 (ref 5–15)
BUN: 39 mg/dL — ABNORMAL HIGH (ref 8–23)
CO2: 23 mmol/L (ref 22–32)
Calcium: 9.3 mg/dL (ref 8.9–10.3)
Chloride: 99 mmol/L (ref 98–111)
Creatinine, Ser: 1.85 mg/dL — ABNORMAL HIGH (ref 0.61–1.24)
GFR, Estimated: 39 mL/min — ABNORMAL LOW (ref >60)
Glucose, Bld: 167 mg/dL — ABNORMAL HIGH (ref 70–99)
Potassium: 4.2 mmol/L (ref 3.5–5.1)
Sodium: 136 mmol/L (ref 135–145)
Total Bilirubin: 1.6 mg/dL — ABNORMAL HIGH (ref 0.3–1.2)
Total Protein: 7.8 g/dL (ref 6.5–8.1)

## 2021-04-12 LAB — LIPASE, BLOOD: Lipase: 27 U/L (ref 11–51)

## 2021-04-12 MED ORDER — ONDANSETRON 4 MG PO TBDP
4.0000 mg | ORAL_TABLET | Freq: Once | ORAL | Status: AC | PRN
  Administered 2021-04-12: 4 mg via ORAL
  Filled 2021-04-12: qty 1

## 2021-04-12 NOTE — ED Triage Notes (Signed)
Patient c/o mid abdominal pain and vomiting since last night.

## 2021-04-17 ENCOUNTER — Encounter: Payer: Self-pay | Admitting: Nurse Practitioner

## 2021-04-20 DIAGNOSIS — I10 Essential (primary) hypertension: Secondary | ICD-10-CM | POA: Diagnosis not present

## 2021-04-20 DIAGNOSIS — S39012A Strain of muscle, fascia and tendon of lower back, initial encounter: Secondary | ICD-10-CM | POA: Diagnosis not present

## 2021-04-20 DIAGNOSIS — E119 Type 2 diabetes mellitus without complications: Secondary | ICD-10-CM | POA: Diagnosis not present

## 2021-04-24 ENCOUNTER — Emergency Department (HOSPITAL_BASED_OUTPATIENT_CLINIC_OR_DEPARTMENT_OTHER)
Admission: EM | Admit: 2021-04-24 | Discharge: 2021-04-24 | Disposition: A | Discharge status: Home / Self Care | Payer: Medicare Other | Attending: Emergency Medicine | Admitting: Emergency Medicine

## 2021-04-24 ENCOUNTER — Encounter (HOSPITAL_BASED_OUTPATIENT_CLINIC_OR_DEPARTMENT_OTHER): Payer: Self-pay

## 2021-04-24 ENCOUNTER — Other Ambulatory Visit: Payer: Self-pay

## 2021-04-24 DIAGNOSIS — E1122 Type 2 diabetes mellitus with diabetic chronic kidney disease: Secondary | ICD-10-CM | POA: Diagnosis not present

## 2021-04-24 DIAGNOSIS — G8929 Other chronic pain: Secondary | ICD-10-CM | POA: Insufficient documentation

## 2021-04-24 DIAGNOSIS — M545 Low back pain, unspecified: Secondary | ICD-10-CM | POA: Diagnosis not present

## 2021-04-24 DIAGNOSIS — N183 Chronic kidney disease, stage 3 unspecified: Secondary | ICD-10-CM | POA: Insufficient documentation

## 2021-04-24 DIAGNOSIS — Z79899 Other long term (current) drug therapy: Secondary | ICD-10-CM | POA: Diagnosis not present

## 2021-04-24 DIAGNOSIS — I129 Hypertensive chronic kidney disease with stage 1 through stage 4 chronic kidney disease, or unspecified chronic kidney disease: Secondary | ICD-10-CM | POA: Insufficient documentation

## 2021-04-24 DIAGNOSIS — R209 Unspecified disturbances of skin sensation: Secondary | ICD-10-CM | POA: Diagnosis not present

## 2021-04-24 DIAGNOSIS — M549 Dorsalgia, unspecified: Secondary | ICD-10-CM | POA: Diagnosis not present

## 2021-04-24 DIAGNOSIS — Z7982 Long term (current) use of aspirin: Secondary | ICD-10-CM | POA: Insufficient documentation

## 2021-04-24 DIAGNOSIS — M546 Pain in thoracic spine: Secondary | ICD-10-CM | POA: Insufficient documentation

## 2021-04-24 DIAGNOSIS — Z8546 Personal history of malignant neoplasm of prostate: Secondary | ICD-10-CM | POA: Insufficient documentation

## 2021-04-24 DIAGNOSIS — W19XXXA Unspecified fall, initial encounter: Secondary | ICD-10-CM | POA: Diagnosis not present

## 2021-04-24 DIAGNOSIS — Z743 Need for continuous supervision: Secondary | ICD-10-CM | POA: Diagnosis not present

## 2021-04-24 NOTE — ED Notes (Signed)
Patient is placed up for discharge. Does not want to wait for AVS or vitals. Patient ambulatory out of department.

## 2021-04-24 NOTE — ED Triage Notes (Signed)
Patient BIB [facility] from [facility] for Back Pain.  Patient has Lower Back Pain that has been present for at least a few months. Endorses  being at [employer] and falling due to Pain.  Also states he has lost approximately 30 lbs across the Span of 8 months.   BIB Stretcher and Ambulatory to Triage Room. A&Ox4. GCS 15. Ambulatory.

## 2021-04-24 NOTE — ED Provider Notes (Incomplete)
[facility] EMERGENCY DEPT Provider Note   CSN: [id number] Arrival date & time: 04/24/21  1632     History {Add pertinent medical, surgical, social history, OB history to HPI:1} Chief Complaint  Patient presents with   Back Pain    [name] is a 66 y.o. male.  HPI     Home Medications Prior to Admission medications   Medication Sig Start Date End Date Taking? Authorizing Provider  acetaminophen (TYLENOL) 325 MG tablet Take 2 tablets (650 mg total) by mouth every 6 (six) hours as needed for mild pain (or Fever >/= 101). 02/12/20   Domenic Polite, MD  alfuzosin (UROXATRAL) 10 MG 24 hr tablet Take 1 tablet (10 mg total) by mouth daily with breakfast. 03/30/21   Ghimire, Henreitta Leber, MD  amLODipine (NORVASC) 5 MG tablet Take 1 tablet (5 mg total) by mouth daily. 01/07/21   Regalado, Belkys A, MD  ARIPiprazole (ABILIFY) 2 MG tablet Take 1 tablet (2 mg total) by mouth daily. 03/30/21   Ghimire, Henreitta Leber, MD  aspirin EC 81 MG tablet Take 81 mg by mouth daily with breakfast.     [provider]  atorvastatin (LIPITOR) 40 MG tablet Take 1 tablet (40 mg total) by mouth daily. 03/30/21   Ghimire, Henreitta Leber, MD  dicyclomine (BENTYL) 20 MG tablet Take 1 tablet (20 mg total) by mouth 2 (two) times daily. 04/09/21   Deno Etienne, DO  finasteride (PROSCAR) 5 MG tablet Take 1 tablet (5 mg total) by mouth daily. 03/30/21   Ghimire, Henreitta Leber, MD  Multiple Vitamin (MULTIVITAMIN WITH MINERALS) TABS tablet Take 1 tablet by mouth daily.    [provider]  ondansetron (ZOFRAN-ODT) 4 MG disintegrating tablet 4mg  ODT q4 hours prn nausea/vomit 04/09/21   Deno Etienne, DO  Semaglutide (RYBELSUS) 14 MG TABS Take 1 tablet by mouth daily. Take 30 minutes before breakfast 12/06/20   [name], [name]  sildenafil (REVATIO) 20 MG tablet Take 1 tablet (20 mg total) by mouth daily as needed. 03/30/21   Ghimire, Henreitta Leber, MD  Tetrahydrozoline HCl (VISINE OP) Place 1 drop into both eyes  daily as needed (itching/irritation).    [provider]  traZODone (DESYREL) 50 MG tablet Take 1 tablet (50 mg total) by mouth at bedtime as needed for sleep. 03/30/21   Ghimire, Henreitta Leber, MD      Allergies    Haldol [haloperidol]    Review of Systems   Review of Systems  Physical Exam Updated Vital Signs BP (!) 149/102    Pulse (!) 102    Temp 98.6 F (37 C)    Resp 18    Ht 5\' 6"  (1.676 m)    Wt 78.9 kg    SpO2 100%    BMI 28.08 kg/m  Physical Exam  ED Results / Procedures / Treatments   Labs (all labs ordered are listed, but only abnormal results are displayed) Labs Reviewed - No data to display  EKG None  Radiology No results found.  Procedures Procedures  {Document cardiac monitor, telemetry assessment procedure when appropriate:1}  Medications Ordered in ED Medications - No data to display  ED Course/ Medical Decision Making/ A&P                           Medical Decision Making  ***  {Document critical care time when appropriate:1} {Document review of labs and clinical decision tools ie heart score, Chads2Vasc2 etc:1}  {Document  your independent review of radiology images, and any outside records:1} {Document your discussion with family members, caretakers, and with consultants:1} {Document social determinants of health affecting pt's care:1} {Document your decision making why or why not admission, treatments were needed:1} Final Clinical Impression(s) / ED Diagnoses Final diagnoses:  None    Rx / DC Orders ED Discharge Orders     None

## 2021-04-24 NOTE — Discharge Instructions (Signed)
You were seen in the ER today for back pain.  You refused any further physical exam and any testing in the emergency department today.  I do recommend you follow-up with your oncologist regarding your weight loss and progressively worsening back pain in the context of your known prostate cancer.  Return to the ER with any new severe symptoms

## 2021-04-24 NOTE — ED Notes (Signed)
Patient requesting [employer] V[employer] at this Time from Waiting Area. Charge RN aware.

## 2021-04-24 NOTE — ED Provider Notes (Signed)
[facility] EMERGENCY DEPT Provider Note   CSN: [id number] Arrival date & time: 04/24/21  1632     History  Chief Complaint  Patient presents with   Back Pain    [name] is a 66 y.o. male with a history of prostate cancer for which he is not currently being treated who presents via EMS after acute exacerbation of his back pain while he was at [employer].  Initial reports of that he fell secondary to back pain but patient states that he did not fall rather lowered himself to the ground and laid on the concrete due to his back pain hurting so severely.  He does endorse intermittent numbness and tingling down his left leg but denies any increased difficulty starting his urine stream, urinary retention, urinary incontinence, saddle anesthesia, or fecal incontinence.  At time of my arrival to the room the patient states that he does not want to be examined nor does he want to stay for any testing.  He seems very anxious and keeps saying that he would like to go home to lie down because his back is hurting.  Additionally he does note that he has lost approximately 30 pounds over the last 8 months.  I personally reviewed his medical records.  In addition to the above listed prostate cancer history of postpolio syndrome, type 2 diabetes, hypertension, CKD stage III, and depression.  He is not anticoagulated.  HPI     Home Medications Prior to Admission medications   Medication Sig Start Date End Date Taking? Authorizing Provider  acetaminophen (TYLENOL) 325 MG tablet Take 2 tablets (650 mg total) by mouth every 6 (six) hours as needed for mild pain (or Fever >/= 101). 02/12/20   Domenic Polite, MD  alfuzosin (UROXATRAL) 10 MG 24 hr tablet Take 1 tablet (10 mg total) by mouth daily with breakfast. 03/30/21   Ghimire, Henreitta Leber, MD  amLODipine (NORVASC) 5 MG tablet Take 1 tablet (5 mg total) by mouth daily. 01/07/21   Regalado, Belkys A, MD  ARIPiprazole (ABILIFY) 2 MG tablet Take  1 tablet (2 mg total) by mouth daily. 03/30/21   Ghimire, Henreitta Leber, MD  aspirin EC 81 MG tablet Take 81 mg by mouth daily with breakfast.     [provider]  atorvastatin (LIPITOR) 40 MG tablet Take 1 tablet (40 mg total) by mouth daily. 03/30/21   Ghimire, Henreitta Leber, MD  dicyclomine (BENTYL) 20 MG tablet Take 1 tablet (20 mg total) by mouth 2 (two) times daily. 04/09/21   Deno Etienne, DO  finasteride (PROSCAR) 5 MG tablet Take 1 tablet (5 mg total) by mouth daily. 03/30/21   Ghimire, Henreitta Leber, MD  Multiple Vitamin (MULTIVITAMIN WITH MINERALS) TABS tablet Take 1 tablet by mouth daily.    [provider]  ondansetron (ZOFRAN-ODT) 4 MG disintegrating tablet 4mg  ODT q4 hours prn nausea/vomit 04/09/21   Deno Etienne, DO  Semaglutide (RYBELSUS) 14 MG TABS Take 1 tablet by mouth daily. Take 30 minutes before breakfast 12/06/20   [name], [name]  sildenafil (REVATIO) 20 MG tablet Take 1 tablet (20 mg total) by mouth daily as needed. 03/30/21   Ghimire, Henreitta Leber, MD  Tetrahydrozoline HCl (VISINE OP) Place 1 drop into both eyes daily as needed (itching/irritation).    [provider]  traZODone (DESYREL) 50 MG tablet Take 1 tablet (50 mg total) by mouth at bedtime as needed for sleep. 03/30/21   Ghimire, Henreitta Leber, MD  Allergies    Haldol [haloperidol]    Review of Systems   Review of Systems  Constitutional:  Positive for fatigue and unexpected weight change.  HENT: Negative.    Respiratory: Negative.    Cardiovascular: Negative.   Gastrointestinal: Negative.   Genitourinary:  Positive for difficulty urinating. Negative for decreased urine volume, dysuria, flank pain and urgency.  Musculoskeletal:  Positive for back pain.  Neurological: Negative.   Hematological: Negative.    Physical Exam Updated Vital Signs BP (!) 149/102    Pulse (!) 102    Temp 98.6 F (37 C)    Resp 18    Ht 5\' 6"  (1.676 m)    Wt 78.9 kg    SpO2 100%    BMI 28.08 kg/m  Physical Exam Vitals  and nursing note reviewed.  Constitutional:      Appearance: He is not ill-appearing or toxic-appearing.  HENT:     Head: Normocephalic and atraumatic.  Eyes:     General: No scleral icterus.       Right eye: No discharge.        Left eye: No discharge.     Conjunctiva/sclera: Conjunctivae normal.  Cardiovascular:     Heart sounds: Normal heart sounds. No murmur heard. Pulmonary:     Effort: Pulmonary effort is normal.     Breath sounds: Normal breath sounds.  Skin:    General: Skin is warm and dry.     Capillary Refill: Capillary refill takes less than 2 seconds.  Neurological:     General: No focal deficit present.     Mental Status: He is alert.     Gait: Gait is intact.  Psychiatric:        Mood and Affect: Mood normal.    ED Results / Procedures / Treatments   Labs (all labs ordered are listed, but only abnormal results are displayed) Labs Reviewed - No data to display  EKG None  Radiology No results found.  Procedures Procedures    Medications Ordered in ED Medications - No data to display  ED Course/ Medical Decision Making/ A&P                           Medical Decision Making 66 year old male with untreated prostate cancer who presents with concern for a months of progressively worsening back pain and 30 pound weight loss unintentionally.  Patient was mildly tachycardic and hypertensive on intake, vital signs otherwise normal.  Cardiopulmonary exam was normal but patient refused any further physical exam he was in the restroom at time of my arrival to the room (bathroom inside of this patient exam room) he was able to stand up from the toilet and ambulate back to the main room after washing his hands without difficulty.  Extensive discussion with the patient regarding concerns given his ongoing weight loss and progressively worsening back pain with intermittent paresthesias down the left leg.  Patient voiced understanding of my concerns, however states  that he did not want any further examination and did not wish to remain in the emergency department any longer or undergo any testing.  He did state he was willing to call his oncologist for follow-up in the outpatient setting and continue to express shows this to be discharged at this time.  Given reassuring cardiopulmonary exam, vital signs, and ambulation in the emergency department patient is hemodynamically stable and safe for discharge so there is ongoing concern for the symptoms he  is describing.  Keylan voiced understanding of his limited medical evaluation.  Return precautions were given.  Patient was hemodynamically stable and ambulated out of the emergency department.  This chart was dictated using voice recognition software, Dragon. Despite the best efforts of this provider to proofread and correct errors, errors may still occur which can change documentation meaning.       Final Clinical Impression(s) / ED Diagnoses Final diagnoses:  None    Rx / DC Orders ED Discharge Orders     None         Aura Dials 04/25/21 1258    Lacretia Leigh, MD 04/26/21 2247

## 2021-04-24 NOTE — ED Notes (Signed)
Upon completing Triage. The Patient states he would not like to stay to be seen by EDP and would like to leave.

## 2021-04-24 NOTE — ED Notes (Signed)
Charge RN made aware of Patient.

## 2021-04-24 NOTE — ED Notes (Signed)
Patient ambulated to Waiting Area stating he will probably attempt to ambulate to [employer].

## 2021-04-24 NOTE — ED Notes (Signed)
Patient came back to nurse desk and asked for discharge paperwork so he could get a cab voucher to go home.

## 2021-05-08 ENCOUNTER — Encounter (HOSPITAL_COMMUNITY): Payer: Self-pay

## 2021-05-08 ENCOUNTER — Emergency Department (HOSPITAL_COMMUNITY)
Admission: EM | Admit: 2021-05-08 | Discharge: 2021-05-08 | Disposition: A | Discharge status: Home / Self Care | Payer: Medicare Other | Attending: Emergency Medicine | Admitting: Emergency Medicine

## 2021-05-08 DIAGNOSIS — R630 Anorexia: Secondary | ICD-10-CM | POA: Diagnosis not present

## 2021-05-08 DIAGNOSIS — R634 Abnormal weight loss: Secondary | ICD-10-CM | POA: Diagnosis not present

## 2021-05-08 DIAGNOSIS — R531 Weakness: Secondary | ICD-10-CM | POA: Insufficient documentation

## 2021-05-08 DIAGNOSIS — R6889 Other general symptoms and signs: Secondary | ICD-10-CM | POA: Diagnosis not present

## 2021-05-08 DIAGNOSIS — E119 Type 2 diabetes mellitus without complications: Secondary | ICD-10-CM | POA: Diagnosis not present

## 2021-05-08 DIAGNOSIS — F32A Depression, unspecified: Secondary | ICD-10-CM | POA: Diagnosis not present

## 2021-05-08 DIAGNOSIS — Z5321 Procedure and treatment not carried out due to patient leaving prior to being seen by health care provider: Secondary | ICD-10-CM | POA: Insufficient documentation

## 2021-05-08 DIAGNOSIS — Z743 Need for continuous supervision: Secondary | ICD-10-CM | POA: Diagnosis not present

## 2021-05-08 DIAGNOSIS — I10 Essential (primary) hypertension: Secondary | ICD-10-CM | POA: Diagnosis not present

## 2021-05-08 DIAGNOSIS — R109 Unspecified abdominal pain: Secondary | ICD-10-CM | POA: Insufficient documentation

## 2021-05-08 NOTE — ED Notes (Signed)
Pt. Refused blood draw due to the long wait in the lobby. Nurse ware.

## 2021-05-08 NOTE — ED Triage Notes (Signed)
Pt arrived via EMS, from dr office. C/o generalized wkn, tearful and upset in triage.

## 2021-05-08 NOTE — ED Provider Triage Note (Signed)
Emergency Medicine Provider Triage Evaluation Note  [name] , a 66 y.o. male  was evaluated in triage.  Pt complains of weakness, decreased appetite, weight loss.  Patient states this issue has been ongoing for weeks.  He feels very weak.  He is not able to eat.  He is having abdominal pain.  No vomiting or diarrhea.  No fevers.  Patient states he has been losing weight  Review of Systems  Positive: Weight loss Negative: Fever  Physical Exam  BP 127/90 (BP Location: Left Arm)    Pulse (!) 109    Temp 97.6 F (36.4 C) (Oral)    Resp (!) 22    SpO2 99%  Gen:   Awake, tearful Resp:  Normal effort  MSK:   Patient is able to ambulate but is somewhat hunched over Other:  Abdomen with generalized tenderness  Medical Decision Making  Medically screening exam initiated at 1:24 PM.  Appropriate orders placed.  [name] was informed that the remainder of the evaluation will be completed by another provider, this initial triage assessment does not replace that evaluation, and the importance of remaining in the ED until their evaluation is complete.  Several recent visits to the ED in January and February.  Patient did have a CT scan of the abdomen pelvis in January of this year that did not show any acute findings.  Patient was noted to have stable postop changes from right ureteral reimplantation.  Stable moderate enlarged prostate.   Dorie Rank, MD 05/08/21 1326

## 2021-05-16 DIAGNOSIS — Z0001 Encounter for general adult medical examination with abnormal findings: Secondary | ICD-10-CM | POA: Diagnosis not present

## 2021-05-16 DIAGNOSIS — M545 Low back pain, unspecified: Secondary | ICD-10-CM | POA: Diagnosis not present

## 2021-05-16 DIAGNOSIS — Z1329 Encounter for screening for other suspected endocrine disorder: Secondary | ICD-10-CM | POA: Diagnosis not present

## 2021-05-16 DIAGNOSIS — I10 Essential (primary) hypertension: Secondary | ICD-10-CM | POA: Diagnosis not present

## 2021-05-16 DIAGNOSIS — E119 Type 2 diabetes mellitus without complications: Secondary | ICD-10-CM | POA: Diagnosis not present

## 2021-05-16 DIAGNOSIS — N1831 Chronic kidney disease, stage 3a: Secondary | ICD-10-CM | POA: Diagnosis not present

## 2021-05-16 DIAGNOSIS — Z131 Encounter for screening for diabetes mellitus: Secondary | ICD-10-CM | POA: Diagnosis not present

## 2021-05-16 DIAGNOSIS — G8929 Other chronic pain: Secondary | ICD-10-CM | POA: Diagnosis not present

## 2021-05-16 DIAGNOSIS — Z136 Encounter for screening for cardiovascular disorders: Secondary | ICD-10-CM | POA: Diagnosis not present

## 2021-05-22 ENCOUNTER — Emergency Department (HOSPITAL_COMMUNITY): Payer: Medicare Other

## 2021-05-22 ENCOUNTER — Other Ambulatory Visit: Payer: Self-pay

## 2021-05-22 ENCOUNTER — Emergency Department (HOSPITAL_COMMUNITY)
Admission: EM | Admit: 2021-05-22 | Discharge: 2021-05-22 | Disposition: A | Discharge status: Home / Self Care | Payer: Medicare Other | Attending: Emergency Medicine | Admitting: Emergency Medicine

## 2021-05-22 ENCOUNTER — Encounter (HOSPITAL_COMMUNITY): Payer: Self-pay

## 2021-05-22 DIAGNOSIS — Z7982 Long term (current) use of aspirin: Secondary | ICD-10-CM | POA: Diagnosis not present

## 2021-05-22 DIAGNOSIS — Z79899 Other long term (current) drug therapy: Secondary | ICD-10-CM | POA: Insufficient documentation

## 2021-05-22 DIAGNOSIS — R634 Abnormal weight loss: Secondary | ICD-10-CM

## 2021-05-22 DIAGNOSIS — R1033 Periumbilical pain: Secondary | ICD-10-CM | POA: Insufficient documentation

## 2021-05-22 DIAGNOSIS — R109 Unspecified abdominal pain: Secondary | ICD-10-CM

## 2021-05-22 DIAGNOSIS — R1012 Left upper quadrant pain: Secondary | ICD-10-CM | POA: Diagnosis not present

## 2021-05-22 DIAGNOSIS — R1032 Left lower quadrant pain: Secondary | ICD-10-CM | POA: Diagnosis not present

## 2021-05-22 DIAGNOSIS — N50812 Left testicular pain: Secondary | ICD-10-CM | POA: Diagnosis not present

## 2021-05-22 LAB — LIPASE, BLOOD: Lipase: 37 U/L (ref 11–51)

## 2021-05-22 LAB — CBC WITH DIFFERENTIAL/PLATELET
Abs Immature Granulocytes: 0.02 10*3/uL (ref 0.00–0.07)
Basophils Absolute: 0 10*3/uL (ref 0.0–0.1)
Basophils Relative: 1 %
Eosinophils Absolute: 0.1 10*3/uL (ref 0.0–0.5)
Eosinophils Relative: 1 %
HCT: 50.8 % (ref 39.0–52.0)
Hemoglobin: 17.4 g/dL — ABNORMAL HIGH (ref 13.0–17.0)
Immature Granulocytes: 0 %
Lymphocytes Relative: 14 %
Lymphs Abs: 1.1 10*3/uL (ref 0.7–4.0)
MCH: 29.7 pg (ref 26.0–34.0)
MCHC: 34.3 g/dL (ref 30.0–36.0)
MCV: 86.8 fL (ref 80.0–100.0)
Monocytes Absolute: 0.5 10*3/uL (ref 0.1–1.0)
Monocytes Relative: 7 %
Neutro Abs: 6.2 10*3/uL (ref 1.7–7.7)
Neutrophils Relative %: 77 %
Platelets: 221 10*3/uL (ref 150–400)
RBC: 5.85 MIL/uL — ABNORMAL HIGH (ref 4.22–5.81)
RDW: 13.3 % (ref 11.5–15.5)
WBC: 8 10*3/uL (ref 4.0–10.5)
nRBC: 0 % (ref 0.0–0.2)

## 2021-05-22 LAB — URINALYSIS, ROUTINE W REFLEX MICROSCOPIC
Bacteria, UA: NONE SEEN
Bilirubin Urine: NEGATIVE
Glucose, UA: >=500 mg/dL — AB
Hgb urine dipstick: NEGATIVE
Ketones, ur: NEGATIVE mg/dL
Leukocytes,Ua: NEGATIVE
Nitrite: NEGATIVE
Protein, ur: 30 mg/dL — AB
Specific Gravity, Urine: 1.016 (ref 1.005–1.030)
pH: 5 (ref 5.0–8.0)

## 2021-05-22 LAB — COMPREHENSIVE METABOLIC PANEL
ALT: 39 U/L (ref 0–44)
AST: 26 U/L (ref 15–41)
Albumin: 4 g/dL (ref 3.5–5.0)
Alkaline Phosphatase: 123 U/L (ref 38–126)
Anion gap: 6 (ref 5–15)
BUN: 35 mg/dL — ABNORMAL HIGH (ref 8–23)
CO2: 27 mmol/L (ref 22–32)
Calcium: 9.3 mg/dL (ref 8.9–10.3)
Chloride: 101 mmol/L (ref 98–111)
Creatinine, Ser: 1.62 mg/dL — ABNORMAL HIGH (ref 0.61–1.24)
GFR, Estimated: 46 mL/min — ABNORMAL LOW (ref >60)
Glucose, Bld: 239 mg/dL — ABNORMAL HIGH (ref 70–99)
Potassium: 4 mmol/L (ref 3.5–5.1)
Sodium: 134 mmol/L — ABNORMAL LOW (ref 135–145)
Total Bilirubin: 0.7 mg/dL (ref 0.3–1.2)
Total Protein: 7.4 g/dL (ref 6.5–8.1)

## 2021-05-22 MED ORDER — LORAZEPAM 1 MG PO TABS
1.0000 mg | ORAL_TABLET | Freq: Once | ORAL | Status: AC
Start: 2021-05-22 — End: 2021-05-22
  Administered 2021-05-22: 1 mg via ORAL
  Filled 2021-05-22: qty 1

## 2021-05-22 MED ORDER — IOHEXOL 300 MG/ML  SOLN: 80.0000 mL | Freq: Once | INTRAMUSCULAR | Status: DC | PRN

## 2021-05-22 NOTE — ED Notes (Signed)
Pt is crying saying he wants to leave ?

## 2021-05-22 NOTE — ED Triage Notes (Signed)
Patient states he is not suicidal and denies chest pain in triage. ? ?Patient is crying and states that "my lady friend and I broke up and I am real lonely." Patient states, "I have been losing weight and I feel bad."  ?Patient states, "I messed things up real bad because I aggravated her all the time." ?

## 2021-05-22 NOTE — ED Notes (Signed)
Pt ambulated to restroom with assistance.  

## 2021-05-22 NOTE — ED Notes (Signed)
Pt in bed crying bc he misses his friend. ?

## 2021-05-22 NOTE — Discharge Instructions (Addendum)
Your work-up today was normal, your labs and CT were without evidence of what could be causing your weight loss and generalized fatigue.  I know that you are under a lot of stress after your break-up, I recommend you follow-up with your primary care physician for further evaluation and for anxiety management. ? ?I will call you if your testicular ultrasound results are abnormal. I hope you feel better soon ?

## 2021-05-22 NOTE — ED Provider Notes (Signed)
[facility] DEPT Provider Note   CSN: [id number] Arrival date & time: 05/22/21  1034     History  Chief Complaint  Patient presents with   tearful    [name] is a 66 y.o. male with PMH significant for acute on chronic kidney failure, rectal bleeding, diabetes, hypertension and GERD who presents to the ED for evaluation of generalized weakness and abdominal pain.  Initially, patient admits that he broke up with his girlfriend a few weeks ago and has since lost about 35 pounds and just feels quite tired and lethargic.  After further questioning, he does note that he has been eating the same amount of food, noting that he "eats quite a bit".  He states that he feels quite weak and tired and endorses an umbilical abdominal pain.  He denies vomiting and diarrhea.  He also endorses left-sided testicle pain and states he has a prior history of epididymitis.  He denies difficulty urinating, hematuria.  He denies chest pain, shortness of breath, weakness and fatigue.  Denies suicidal homicidal ideation.  HPI     Home Medications Prior to Admission medications   Medication Sig Start Date End Date Taking? Authorizing Provider  acetaminophen (TYLENOL) 325 MG tablet Take 2 tablets (650 mg total) by mouth every 6 (six) hours as needed for mild pain (or Fever >/= 101). 02/12/20   Domenic Polite, MD  alfuzosin (UROXATRAL) 10 MG 24 hr tablet Take 1 tablet (10 mg total) by mouth daily with breakfast. 03/30/21   Ghimire, Henreitta Leber, MD  amLODipine (NORVASC) 5 MG tablet Take 1 tablet (5 mg total) by mouth daily. 01/07/21   Regalado, Belkys A, MD  ARIPiprazole (ABILIFY) 2 MG tablet Take 1 tablet (2 mg total) by mouth daily. 03/30/21   Ghimire, Henreitta Leber, MD  aspirin EC 81 MG tablet Take 81 mg by mouth daily with breakfast.     [provider]  atorvastatin (LIPITOR) 40 MG tablet Take 1 tablet (40 mg total) by mouth daily. 03/30/21   Ghimire, Henreitta Leber, MD  dicyclomine  (BENTYL) 20 MG tablet Take 1 tablet (20 mg total) by mouth 2 (two) times daily. 04/09/21   Deno Etienne, DO  finasteride (PROSCAR) 5 MG tablet Take 1 tablet (5 mg total) by mouth daily. 03/30/21   Ghimire, Henreitta Leber, MD  Multiple Vitamin (MULTIVITAMIN WITH MINERALS) TABS tablet Take 1 tablet by mouth daily.    [provider]  ondansetron (ZOFRAN-ODT) 4 MG disintegrating tablet '4mg'$  ODT q4 hours prn nausea/vomit 04/09/21   Deno Etienne, DO  Semaglutide (RYBELSUS) 14 MG TABS Take 1 tablet by mouth daily. Take 30 minutes before breakfast 12/06/20   [name], [name]  sildenafil (REVATIO) 20 MG tablet Take 1 tablet (20 mg total) by mouth daily as needed. 03/30/21   Ghimire, Henreitta Leber, MD  Tetrahydrozoline HCl (VISINE OP) Place 1 drop into both eyes daily as needed (itching/irritation).    [provider]  traZODone (DESYREL) 50 MG tablet Take 1 tablet (50 mg total) by mouth at bedtime as needed for sleep. 03/30/21   Ghimire, Henreitta Leber, MD      Allergies    Haldol [haloperidol]    Review of Systems   Review of Systems  Physical Exam Updated Vital Signs BP (!) 141/104    Pulse 98    Temp 98 F (36.7 C)    Resp 18    Ht '5\' 6"'$  (1.676 m)    Wt 78.9 kg  SpO2 98%    BMI 28.08 kg/m  Physical Exam Vitals and nursing note reviewed.  Constitutional:      General: He is not in acute distress.    Appearance: He is ill-appearing.     Comments: Ill-appearing although nontoxic  HENT:     Head: Atraumatic.     Mouth/Throat:     Mouth: Mucous membranes are dry.  Eyes:     Conjunctiva/sclera: Conjunctivae normal.  Cardiovascular:     Rate and Rhythm: Regular rhythm. Tachycardia present.     Pulses: Normal pulses.     Heart sounds: No murmur heard. Pulmonary:     Effort: Pulmonary effort is normal. No respiratory distress.     Breath sounds: Normal breath sounds.  Abdominal:     General: Abdomen is flat. There is no distension.     Palpations: Abdomen is soft.     Tenderness: There is  abdominal tenderness.     Comments: Abdomen is soft, nondistended, tender to palpation at the umbilical and left upper quadrant region.  Musculoskeletal:        General: Normal range of motion.     Cervical back: Normal range of motion.  Skin:    General: Skin is warm and dry.     Capillary Refill: Capillary refill takes less than 2 seconds.  Neurological:     General: No focal deficit present.     Mental Status: He is alert.  Psychiatric:        Mood and Affect: Mood normal.    ED Results / Procedures / Treatments   Labs (all labs ordered are listed, but only abnormal results are displayed) Labs Reviewed  COMPREHENSIVE METABOLIC PANEL - Abnormal; Notable for the following components:      Result Value   Sodium 134 (*)    Glucose, Bld 239 (*)    BUN 35 (*)    Creatinine, Ser 1.62 (*)    GFR, Estimated 46 (*)    All other components within normal limits  CBC WITH DIFFERENTIAL/PLATELET - Abnormal; Notable for the following components:   RBC 5.85 (*)    Hemoglobin 17.4 (*)    All other components within normal limits  URINALYSIS, ROUTINE W REFLEX MICROSCOPIC - Abnormal; Notable for the following components:   Glucose, UA >=500 (*)    Protein, ur 30 (*)    All other components within normal limits  LIPASE, BLOOD    EKG None  Radiology CT ABDOMEN PELVIS WO CONTRAST  Result Date: 05/22/2021 CLINICAL DATA:  Abdominal pain.  Weight loss. EXAM: CT ABDOMEN AND PELVIS WITHOUT CONTRAST TECHNIQUE: Multidetector CT imaging of the abdomen and pelvis was performed following the standard protocol without IV contrast. RADIATION DOSE REDUCTION: This exam was performed according to the departmental dose-optimization program which includes automated exposure control, adjustment of the mA and/or kV according to patient size and/or use of iterative reconstruction technique. COMPARISON:  04/09/2021 FINDINGS: Lower chest: No acute abnormality.  Coronary artery atherosclerosis. Hepatobiliary: No  focal liver abnormality is seen. No gallstones, gallbladder wall thickening, or biliary dilatation. Pancreas: Unremarkable. No pancreatic ductal dilatation or surrounding inflammatory changes. Spleen: Normal in size without focal abnormality. Adrenals/Urinary Tract: Adrenal glands are unremarkable. 18 mm hypodense, fluid attenuating left upper pole renal mass consistent with a cyst. Stable 4.5 cm left inferior pole renal cyst. No urolithiasis. Severe right hydroureteronephrosis and stable postoperative changes at the right ureteral reimplantation site. No left obstructive uropathy. Bladder wall thickening as can be seen with bladder outlet  obstruction. Stomach/Bowel: Stomach is within normal limits. No evidence of bowel wall thickening, distention, or inflammatory changes. Normal appendix. Moderate amount of stool throughout the colon. Vascular/Lymphatic: Normal caliber abdominal aorta with mild atherosclerosis. No lymphadenopathy. Reproductive: Enlarged prostate gland. Other: No abdominal wall hernia or abnormality. No abdominopelvic ascites. Musculoskeletal: No acute osseous abnormality. No aggressive osseous lesion. IMPRESSION: 1. No acute abdominal or pelvic pathology. 2. Severe right hydroureteronephrosis and stable postoperative changes at the right ureteral reimplantation site. 3. Bladder wall thickening as can be seen with bladder outlet obstruction. 4. Enlarged prostate gland. 5.  Aortic Atherosclerosis (ICD10-I70.0). Electronically Signed   By: Kathreen Devoid M.D.   On: 05/22/2021 14:20    Procedures Procedures   Medications Ordered in ED Medications  LORazepam (ATIVAN) tablet 1 mg (1 mg Oral Given 05/22/21 1254)    ED Course/ Medical Decision Making/ A&P                           Medical Decision Making Amount and/or Complexity of Data Reviewed Labs: ordered. Radiology: ordered. ECG/medicine tests: ordered.  Risk Prescription drug management.   History:  Per HPI Social determinants of  health: none  Initial impression:  This patient presents to the ED for concern of weakness, fatigue and abdominal pain, this involves an extensive number of treatment options, and is a complaint that carries with it a high risk of complications and morbidity.    This is a 66 year old male who presents to the ED for evaluation of weakness and abdominal pain that started approximately 2 weeks ago with a 35 pound weight loss in 3 weeks.  Slowly worsening since onset.  Patient seems to be attributing symptoms anxiety secondary to a break-up with his girlfriend, however he has had no change in appetite or oral intake and has focal tenderness on abdominal exam.  Will prescribe Ativan 1 mg here in the ED for anxiety while obtaining labs and CT abdomen.   Lab Tests and EKG:  I Ordered, reviewed, and interpreted labs and EKG.  The pertinent results include:  CMP and CBC without acute findings Urinalysis unremarkable Lipase normal   Imaging Studies ordered:  I ordered imaging studies including  CT abdomen with previously noted chronic changes, no acute findings Testicular ultrasound Without evidence of torsion or epididymitis, small hydrocele I independently visualized and interpreted imaging and I agree with the radiologist interpretation.    Cardiac Monitoring:  The patient was maintained on a cardiac monitor.  I personally viewed and interpreted the cardiac monitored which showed an underlying rhythm of: NSR   Medicines ordered and prescription drug management:  I ordered medication including: Ativan 1 mg for anxiety Reevaluation of the patient after these medicines showed that the patient stayed the same I have reviewed the patients home medicines and have made adjustments as needed  ED Course: Patient presents with multiple complaints including severe weight loss with abdominal pain and testicular pain additionally, he is quite tearful on the floor secondary to a recent break-up with  his girlfriend.  No improvement with Ativan for the anxiety.  However  Disposition:  After consideration of the diagnostic results, physical exam, history and the patients response to treatment feel that the patent would benefit from discharge.   Anxiety Abdominal pain Weight loss Pain left testicle: No acute findings on imaging and labs that could account for patient's weight loss and abdominal pain.  It is possible that he has had such as sudden  weight loss secondary to acute stress from his recent break-up.  Recommend that he follow-up with his PCP for evaluation and possible anxiety management.  Testicular pain possibly from hydrocele although this is usually painless.  He can follow-up with his primary care doctor about this 2.  Patient expresses understanding and is amenable to plan.  Discharged home in good condition.   Final Clinical Impression(s) / ED Diagnoses Final diagnoses:  Abdominal pain, unspecified abdominal location  Weight loss, abnormal  Pain in left testicle    Rx / DC Orders ED Discharge Orders     None         Tonye Pearson, [place] 05/22/21 1542    Lucrezia Starch, MD 05/23/21 [id number]

## 2021-05-29 ENCOUNTER — Other Ambulatory Visit: Payer: Self-pay

## 2021-05-29 ENCOUNTER — Emergency Department (HOSPITAL_COMMUNITY): Payer: Medicare Other

## 2021-05-29 ENCOUNTER — Encounter (HOSPITAL_COMMUNITY): Payer: Self-pay

## 2021-05-29 ENCOUNTER — Emergency Department (HOSPITAL_COMMUNITY)
Admission: EM | Admit: 2021-05-29 | Discharge: 2021-05-29 | Disposition: A | Discharge status: Home / Self Care | Payer: Medicare Other | Attending: Emergency Medicine | Admitting: Emergency Medicine

## 2021-05-29 DIAGNOSIS — I10 Essential (primary) hypertension: Secondary | ICD-10-CM | POA: Diagnosis not present

## 2021-05-29 DIAGNOSIS — R110 Nausea: Secondary | ICD-10-CM | POA: Diagnosis not present

## 2021-05-29 DIAGNOSIS — R109 Unspecified abdominal pain: Secondary | ICD-10-CM | POA: Insufficient documentation

## 2021-05-29 DIAGNOSIS — R630 Anorexia: Secondary | ICD-10-CM | POA: Diagnosis present

## 2021-05-29 DIAGNOSIS — Z7982 Long term (current) use of aspirin: Secondary | ICD-10-CM | POA: Diagnosis not present

## 2021-05-29 DIAGNOSIS — Z79899 Other long term (current) drug therapy: Secondary | ICD-10-CM | POA: Insufficient documentation

## 2021-05-29 DIAGNOSIS — R1084 Generalized abdominal pain: Secondary | ICD-10-CM

## 2021-05-29 DIAGNOSIS — R0602 Shortness of breath: Secondary | ICD-10-CM | POA: Insufficient documentation

## 2021-05-29 LAB — CBC WITH DIFFERENTIAL/PLATELET
Abs Immature Granulocytes: 0.03 10*3/uL (ref 0.00–0.07)
Basophils Absolute: 0 10*3/uL (ref 0.0–0.1)
Basophils Relative: 1 %
Eosinophils Absolute: 0.1 10*3/uL (ref 0.0–0.5)
Eosinophils Relative: 1 %
HCT: 52.2 % — ABNORMAL HIGH (ref 39.0–52.0)
Hemoglobin: 17.5 g/dL — ABNORMAL HIGH (ref 13.0–17.0)
Immature Granulocytes: 1 %
Lymphocytes Relative: 11 %
Lymphs Abs: 0.7 10*3/uL (ref 0.7–4.0)
MCH: 29.8 pg (ref 26.0–34.0)
MCHC: 33.5 g/dL (ref 30.0–36.0)
MCV: 88.9 fL (ref 80.0–100.0)
Monocytes Absolute: 0.4 10*3/uL (ref 0.1–1.0)
Monocytes Relative: 7 %
Neutro Abs: 5.1 10*3/uL (ref 1.7–7.7)
Neutrophils Relative %: 79 %
Platelets: 199 10*3/uL (ref 150–400)
RBC: 5.87 MIL/uL — ABNORMAL HIGH (ref 4.22–5.81)
RDW: 13.6 % (ref 11.5–15.5)
WBC: 6.3 10*3/uL (ref 4.0–10.5)
nRBC: 0 % (ref 0.0–0.2)

## 2021-05-29 LAB — COMPREHENSIVE METABOLIC PANEL
ALT: 39 U/L (ref 0–44)
AST: 27 U/L (ref 15–41)
Albumin: 3.8 g/dL (ref 3.5–5.0)
Alkaline Phosphatase: 107 U/L (ref 38–126)
Anion gap: 12 (ref 5–15)
BUN: 29 mg/dL — ABNORMAL HIGH (ref 8–23)
CO2: 23 mmol/L (ref 22–32)
Calcium: 9.3 mg/dL (ref 8.9–10.3)
Chloride: 101 mmol/L (ref 98–111)
Creatinine, Ser: 1.28 mg/dL — ABNORMAL HIGH (ref 0.61–1.24)
GFR, Estimated: >60 mL/min (ref >60)
Glucose, Bld: 294 mg/dL — ABNORMAL HIGH (ref 70–99)
Potassium: 4.3 mmol/L (ref 3.5–5.1)
Sodium: 136 mmol/L (ref 135–145)
Total Bilirubin: 0.5 mg/dL (ref 0.3–1.2)
Total Protein: 7.1 g/dL (ref 6.5–8.1)

## 2021-05-29 MED ORDER — OXYCODONE-ACETAMINOPHEN 5-325 MG PO TABS
1.0000 | ORAL_TABLET | Freq: Once | ORAL | Status: AC
  Administered 2021-05-29: 1 via ORAL
  Filled 2021-05-29: qty 1

## 2021-05-29 MED ORDER — ONDANSETRON 4 MG PO TBDP
4.0000 mg | ORAL_TABLET | Freq: Once | ORAL | Status: AC
  Administered 2021-05-29: 4 mg via ORAL
  Filled 2021-05-29: qty 1

## 2021-05-29 NOTE — ED Notes (Signed)
Pt insisting the doctor come see him and tell him what they are going to do for him, writer explained they will be here to talk to him as soon as they can. ?

## 2021-05-29 NOTE — ED Notes (Signed)
Pt unable to provide urine sample after stating he would, EKG obtained, pt talking on phone delaying x-ray at present time. Pt states he needs to go to a hospital that cares about him. He elects not to leave when given opportunity. Care has continued, labs obtained and sent to lab. ?

## 2021-05-29 NOTE — ED Provider Notes (Signed)
?[facility] DEPT ?Provider Note ? ? ?CSN: [id number] ?Arrival date & time: 05/29/21  1287 ? ?  ? ?History ? ?Chief Complaint  ?Patient presents with  ? no appetite  ? Nausea  ? ? ?[name] is a 66 y.o. 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  Patient reports his symptoms have been ongoing for 4+ weeks.  Patient reports that he is had 40 pound weight loss over the last month.  Patient was seen and evaluated around a week ago and received work-up, imaging which did not show any new acute abnormality.  Patient with chronic disease secondary to kidney function.  Patient at this time worried that he may have cancer, worried about leukemia.  He also endorses some shortness of breath.  He denies any chest pain.  Patient also notably with recent break-up from girlfriend, anxiety, stress about this event.  He reports that he eats around 2 meals a day which is normal.  Endorses occasional vomiting but denies diarrhea, constipation. ? ?HPI ? ?  ? ?Home Medications ?Prior to Admission medications   ?Medication Sig Start Date End Date Taking? Authorizing Provider  ?acetaminophen (TYLENOL) 325 MG tablet Take 2 tablets (650 mg total) by mouth every 6 (six) hours as needed for mild pain (or Fever >/= 101). 02/12/20   Domenic Polite, MD  ?alfuzosin (UROXATRAL) 10 MG 24 hr tablet Take 1 tablet (10 mg total) by mouth daily with breakfast. 03/30/21   Ghimire, Henreitta Leber, MD  ?amLODipine (NORVASC) 5 MG tablet Take 1 tablet (5 mg total) by mouth daily. 01/07/21   Regalado, Belkys A, MD  ?ARIPiprazole (ABILIFY) 2 MG tablet Take 1 tablet (2 mg total) by mouth daily. 03/30/21   Ghimire, Henreitta Leber, MD  ?aspirin EC 81 MG tablet Take 81 mg by mouth daily with breakfast.     [provider]  ?atorvastatin (LIPITOR) 40 MG tablet Take 1 tablet (40 mg total) by mouth daily. 03/30/21   Ghimire, Henreitta Leber, MD  ?dicyclomine (BENTYL) 20 MG tablet Take 1 tablet (20 mg total) by mouth 2 (two) times daily. 04/09/21   Deno Etienne, DO  ?finasteride (PROSCAR) 5 MG tablet Take 1 tablet (5 mg total) by mouth daily. 03/30/21   Ghimire, Henreitta Leber, MD  ?Multiple Vitamin (MULTIVITAMIN WITH MINERALS) TABS tablet Take 1 tablet by mouth daily.    [provider]  ?ondansetron (ZOFRAN-ODT) 4 MG disintegrating tablet '4mg'$  ODT q4 hours prn nausea/vomit 04/09/21   Deno Etienne, DO  ?Semaglutide (RYBELSUS) 14 MG TABS Take 1 tablet by mouth daily. Take 30 minutes before breakfast 12/06/20   [name], [name]  ?sildenafil (REVATIO) 20 MG tablet Take 1 tablet (20 mg total) by mouth daily as needed. 03/30/21   Ghimire, Henreitta Leber, MD  ?Tetrahydrozoline HCl (VISINE OP) Place 1 drop into both eyes daily as needed (itching/irritation).    [provider]  ?traZODone (DESYREL) 50 MG tablet Take 1 tablet (50 mg total) by mouth at bedtime as needed for sleep. 03/30/21   Ghimire, Henreitta Leber, MD  ?   ? ?Allergies    ?Haldol [haloperidol]   ? ?Review of Systems   ?Review of Systems  ?Gastrointestinal:  Positive for abdominal pain and nausea.  ?All other systems reviewed and are negative. ? ?Physical Exam ?Updated Vital Signs ?BP Marland Kitchen)  151/88 (BP Location: Right Arm)   Pulse 97   Temp 97.7 ?F (36.5 ?C) (Oral)   Resp 18   Ht '5\' 6"'$  (1.676 m)   Wt 78.9 kg   SpO2 100%   BMI 28.08 kg/m?  ?Physical Exam ?Vitals and nursing note reviewed.  ?Constitutional:   ?   General: He is not in acute distress. ?   Appearance: Normal appearance.  ?HENT:  ?   Head: Normocephalic and atraumatic.  ?Eyes:  ?   General:     ?   Right eye: No discharge.     ?   Left eye: No discharge.  ?Cardiovascular:  ?   Rate and Rhythm: Normal rate and regular rhythm.  ?   Heart sounds: No murmur heard. ?  No friction rub. No gallop.  ?Pulmonary:  ?    Effort: Pulmonary effort is normal.  ?   Breath sounds: Normal breath sounds.  ?Abdominal:  ?   General: Bowel sounds are normal.  ?   Palpations: Abdomen is soft.  ?   Comments: Tenderness to palpation throughout the abdomen without any masses.  Normal bowel sounds throughout.  No rebound, rigidity, guarding.  ?Skin: ?   General: Skin is warm and dry.  ?   Capillary Refill: Capillary refill takes less than 2 seconds.  ?Neurological:  ?   Mental Status: He is alert and oriented to person, place, and time.  ?Psychiatric:     ?   Mood and Affect: Mood normal.  ?   Comments: Anxious pacing behavior, distracted speech without any evidence of hallucinations.  Thought content is overall normal but patient's affect is quite odd.  ? ? ?ED Results / Procedures / Treatments   ?Labs ?(all labs ordered are listed, but only abnormal results are displayed) ?Labs Reviewed  ?CBC WITH DIFFERENTIAL/PLATELET - Abnormal; Notable for the following components:  ?    Result Value  ? RBC 5.87 (*)   ? Hemoglobin 17.5 (*)   ? HCT 52.2 (*)   ? All other components within normal limits  ?COMPREHENSIVE METABOLIC PANEL - Abnormal; Notable for the following components:  ? Glucose, Bld 294 (*)   ? BUN 29 (*)   ? Creatinine, Ser 1.28 (*)   ? All other components within normal limits  ?URINALYSIS, ROUTINE W REFLEX MICROSCOPIC  ? ? ?EKG ?None ? ?Radiology ?DG Chest 2 View ? ?Result Date: 05/29/2021 ?CLINICAL DATA:  Shortness of breath EXAM: CHEST - 2 VIEW COMPARISON:  Previous studies including the examination of 03/25/2021 FINDINGS: The heart size and mediastinal contours are within normal limits. Both lungs are clear. The visualized skeletal structures are unremarkable. IMPRESSION: No active cardiopulmonary disease. Electronically Signed   By: Elmer Picker M.D.   On: 05/29/2021 10:28   ? ?Procedures ?Procedures  ? ? ?Medications Ordered in ED ?Medications  ?oxyCODONE-acetaminophen (PERCOCET/ROXICET) 5-325 MG per tablet 1 tablet (1 tablet Oral  Given 05/29/21 1002)  ?ondansetron (ZOFRAN-ODT) disintegrating tablet 4 mg (4 mg Oral Given 05/29/21 1002)  ? ? ?ED Course/ Medical Decision Making/ A&P ?  ?                        ?Medical Decision Making ?Amount and/or Complexity of Data Reviewed ?Labs: ordered. ?Radiology: ordered. ? ?Risk ?Prescription drug management. ? ? ?This patient presents to the ED for concern of abdominal pain, nausea, weight loss for the last 4 weeks, this involves an extensive number of treatment options, and  is a complaint that carries with it a high risk of complications and morbidity. The emergent differential diagnosis prior to evaluation includes, but is not limited to, metastatic cancer, chronic disease changes, acute intra-abdominal abnormality including cholecystitis, cholangitis, due to appendicitis, mesenteric ischemia.  Considered acute GI bleed, hepatitis, intractable nausea vomiting secondary to gastroparesis, diverticulitis, versus other.  Also considered that there may be a psychologic component to patient's abdominal pain as he was quite anxious, depressed 1 week ago with similar problems due to recent break-up. ? ?This is not an exhaustive differential.  ? 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 ?Additional history: ?External records from outside source obtained and reviewed including multiple previous ED visits. ? ?Physical Exam: ?Physical exam performed. The pertinent findings include: Tenderness palpation of the abdomen, odd affect, somewhat disheveled appearance ? ?Lab Tests: ?I ordered, and personally interpreted labs.  The pertinent results include: Elevated blood glucose at 294 with no evidence of anion gap.  Creatinine elevated 1.28 with elevated BUN, these are stable or improved from recent kidney function.  Creatinine 1.6 to 1-week ago.  Patient with  polycythemia which is a chronic finding for him, no evidence of leukocytosis, leukocytopenia.  Normal platelets.  Do not believe the patient has leukemia or other white blood cell cancer as he was suspicious of.

## 2021-05-29 NOTE — ED Notes (Signed)
Pt refusing to allow VS to be taken or monitor to be placed back on. States he just wants to see the doctor. ?

## 2021-05-29 NOTE — ED Notes (Signed)
Pt come to desk stating, "I'm tired of waiting, I am going to get something to eat, my stomach hurts" Writer explained that if he is having "stomach" pain he should not be eating or drinking until the Dr allows it. He stated "I am tired of waiting, I was the only one here this morning, no one else was in the lobby, I need answers." Writer explained he will have to leave AMA if he goes to the cafeteria or leaves premises is is AMA, Writer pulled up screen to have pt signed AMA, he then decided to stay.   ?

## 2021-05-29 NOTE — ED Triage Notes (Signed)
Patient reports that he feels sick on his stomach and has not had an appetite in weeks. ?

## 2021-05-29 NOTE — ED Notes (Signed)
Pt just walked out of department to go to cafeteria. He will be removed from system as a  ?

## 2021-05-30 ENCOUNTER — Emergency Department (HOSPITAL_COMMUNITY): Payer: Medicare Other

## 2021-05-30 ENCOUNTER — Emergency Department (HOSPITAL_COMMUNITY)
Admission: EM | Admit: 2021-05-30 | Discharge: 2021-05-30 | Disposition: A | Discharge status: Home / Self Care | Payer: Medicare Other | Attending: Emergency Medicine | Admitting: Emergency Medicine

## 2021-05-30 ENCOUNTER — Other Ambulatory Visit: Payer: Self-pay

## 2021-05-30 ENCOUNTER — Ambulatory Visit (HOSPITAL_COMMUNITY)
Admission: EM | Admit: 2021-05-30 | Discharge: 2021-05-30 | Disposition: A | Discharge status: Other Acute Inpatient Hospital | Payer: 59

## 2021-05-30 DIAGNOSIS — Z79899 Other long term (current) drug therapy: Secondary | ICD-10-CM | POA: Insufficient documentation

## 2021-05-30 DIAGNOSIS — R079 Chest pain, unspecified: Secondary | ICD-10-CM | POA: Diagnosis not present

## 2021-05-30 DIAGNOSIS — F32A Depression, unspecified: Secondary | ICD-10-CM

## 2021-05-30 DIAGNOSIS — R110 Nausea: Secondary | ICD-10-CM | POA: Insufficient documentation

## 2021-05-30 DIAGNOSIS — F329 Major depressive disorder, single episode, unspecified: Secondary | ICD-10-CM | POA: Diagnosis present

## 2021-05-30 DIAGNOSIS — I129 Hypertensive chronic kidney disease with stage 1 through stage 4 chronic kidney disease, or unspecified chronic kidney disease: Secondary | ICD-10-CM | POA: Insufficient documentation

## 2021-05-30 DIAGNOSIS — E1122 Type 2 diabetes mellitus with diabetic chronic kidney disease: Secondary | ICD-10-CM | POA: Diagnosis not present

## 2021-05-30 DIAGNOSIS — R748 Abnormal levels of other serum enzymes: Secondary | ICD-10-CM | POA: Diagnosis not present

## 2021-05-30 DIAGNOSIS — Z7982 Long term (current) use of aspirin: Secondary | ICD-10-CM | POA: Diagnosis not present

## 2021-05-30 DIAGNOSIS — N179 Acute kidney failure, unspecified: Secondary | ICD-10-CM | POA: Diagnosis not present

## 2021-05-30 DIAGNOSIS — N189 Chronic kidney disease, unspecified: Secondary | ICD-10-CM | POA: Insufficient documentation

## 2021-05-30 DIAGNOSIS — Z20822 Contact with and (suspected) exposure to covid-19: Secondary | ICD-10-CM | POA: Diagnosis not present

## 2021-05-30 DIAGNOSIS — R0789 Other chest pain: Secondary | ICD-10-CM | POA: Diagnosis not present

## 2021-05-30 LAB — COMPREHENSIVE METABOLIC PANEL
ALT: 35 U/L (ref 0–44)
AST: 23 U/L (ref 15–41)
Albumin: 3.8 g/dL (ref 3.5–5.0)
Alkaline Phosphatase: 113 U/L (ref 38–126)
Anion gap: 13 (ref 5–15)
BUN: 31 mg/dL — ABNORMAL HIGH (ref 8–23)
CO2: 24 mmol/L (ref 22–32)
Calcium: 9.7 mg/dL (ref 8.9–10.3)
Chloride: 99 mmol/L (ref 98–111)
Creatinine, Ser: 1.81 mg/dL — ABNORMAL HIGH (ref 0.61–1.24)
GFR, Estimated: 40 mL/min — ABNORMAL LOW (ref >60)
Glucose, Bld: 278 mg/dL — ABNORMAL HIGH (ref 70–99)
Potassium: 4.6 mmol/L (ref 3.5–5.1)
Sodium: 136 mmol/L (ref 135–145)
Total Bilirubin: 0.8 mg/dL (ref 0.3–1.2)
Total Protein: 7.2 g/dL (ref 6.5–8.1)

## 2021-05-30 LAB — RESP PANEL BY RT-PCR (FLU A&B, COVID) ARPGX2
Influenza A by PCR: NEGATIVE
Influenza B by PCR: NEGATIVE
SARS Coronavirus 2 by RT PCR: NEGATIVE

## 2021-05-30 LAB — CBC WITH DIFFERENTIAL/PLATELET
Abs Immature Granulocytes: 0.02 10*3/uL (ref 0.00–0.07)
Basophils Absolute: 0 10*3/uL (ref 0.0–0.1)
Basophils Relative: 0 %
Eosinophils Absolute: 0.1 10*3/uL (ref 0.0–0.5)
Eosinophils Relative: 1 %
HCT: 53.6 % — ABNORMAL HIGH (ref 39.0–52.0)
Hemoglobin: 17.7 g/dL — ABNORMAL HIGH (ref 13.0–17.0)
Immature Granulocytes: 0 %
Lymphocytes Relative: 12 %
Lymphs Abs: 0.8 10*3/uL (ref 0.7–4.0)
MCH: 29.6 pg (ref 26.0–34.0)
MCHC: 33 g/dL (ref 30.0–36.0)
MCV: 89.8 fL (ref 80.0–100.0)
Monocytes Absolute: 0.4 10*3/uL (ref 0.1–1.0)
Monocytes Relative: 6 %
Neutro Abs: 5.8 10*3/uL (ref 1.7–7.7)
Neutrophils Relative %: 81 %
Platelets: 214 10*3/uL (ref 150–400)
RBC: 5.97 MIL/uL — ABNORMAL HIGH (ref 4.22–5.81)
RDW: 13.5 % (ref 11.5–15.5)
WBC: 7.2 10*3/uL (ref 4.0–10.5)
nRBC: 0 % (ref 0.0–0.2)

## 2021-05-30 LAB — TROPONIN I (HIGH SENSITIVITY)
Troponin I (High Sensitivity): 11 ng/L (ref <18)
Troponin I (High Sensitivity): 7 ng/L (ref <18)

## 2021-05-30 LAB — LIPASE, BLOOD: Lipase: 61 U/L — ABNORMAL HIGH (ref 11–51)

## 2021-05-30 LAB — MAGNESIUM: Magnesium: 1.8 mg/dL (ref 1.7–2.4)

## 2021-05-30 MED ORDER — DIAZEPAM 5 MG/ML IJ SOLN
2.5000 mg | Freq: Once | INTRAMUSCULAR | Status: AC
  Administered 2021-05-30: 2.5 mg via INTRAVENOUS
  Filled 2021-05-30: qty 2

## 2021-05-30 MED ORDER — LACTATED RINGERS IV BOLUS
1000.0000 mL | Freq: Once | INTRAVENOUS | Status: AC
  Administered 2021-05-30: 1000 mL via INTRAVENOUS

## 2021-05-30 NOTE — ED Notes (Signed)
Pt ambulated to BR independently.

## 2021-05-30 NOTE — ED Triage Notes (Signed)
Pt escorted to ED by [employer]. Pt presents voluntarily for psych evaluation. Pt reports chest discomfort onset one week ago. States "I just feel bad." Pt has broken up with girlfriend and has been sad since breakup.  ?

## 2021-05-30 NOTE — ED Notes (Signed)
Pt given purple scrubs to change into. Pt reports he does not and will not stay at hospital for treatment and wants to go home now. MD notified.  ?

## 2021-05-30 NOTE — ED Notes (Signed)
Pt given turkey sandwich and gingerale. 

## 2021-05-30 NOTE — ED Notes (Signed)
Pt moved to room 52 by previous RN. Pt stated he was not SI and wanted to go home. This Probation officer followed up with Dr Doren Custard on Pt POC. DR Doren Custard reported he would come eval. Pt . ?

## 2021-05-30 NOTE — ED Notes (Signed)
Pt ambulatory to restroom with steady gait.

## 2021-05-30 NOTE — ED Notes (Signed)
EDP eval Pt and printed AVS. This writer walked Pt to front lobby ?

## 2021-05-30 NOTE — ED Provider Notes (Signed)
?[facility] ?Provider Note ? ? ?CSN: [id number] ?Arrival date & time: 05/30/21  1100 ? ?  ? ?History ? ?Chief Complaint  ?Patient presents with  ? Depression  ? Chest Pain  ? ? ?[name] is a 67 y.o. male. ? ?HPI ?Patient presenting for chest pain.  Medical history includes anemia, HTN, depression, T2DM, CKD, HLD, polycythemia, gout, GERD.  He was seen in the emergency department yesterday.  At that time, he was endorsing abdominal pain, nausea, and lack of appetite.  Today he reports previous chest pain which has since resolved.  His main complaint is depressive symptoms.  He states that he feels emotionally bad.  He feels that this is secondary to a recent break-up that he has had with his significant other.  He denies any thoughts of suicide or self-harm.  He does state that he called the police earlier today because he felt sad.  He did not endorse any thoughts of self-harm at that time either.  Police came to check on him and brought him to the emergency department.  Patient reports ongoing abdominal pain and loss of appetite. ?  ? ?Home Medications ?Prior to Admission medications   ?Medication Sig Start Date End Date Taking? Authorizing Provider  ?acetaminophen (TYLENOL) 325 MG tablet Take 2 tablets (650 mg total) by mouth every 6 (six) hours as needed for mild pain (or Fever >/= 101). 02/12/20   Domenic Polite, MD  ?alfuzosin (UROXATRAL) 10 MG 24 hr tablet Take 1 tablet (10 mg total) by mouth daily with breakfast. 03/30/21   Ghimire, Henreitta Leber, MD  ?amLODipine (NORVASC) 5 MG tablet Take 1 tablet (5 mg total) by mouth daily. 01/07/21   Regalado, Belkys A, MD  ?ARIPiprazole (ABILIFY) 2 MG tablet Take 1 tablet (2 mg total) by mouth daily. 03/30/21   Ghimire, Henreitta Leber, MD  ?aspirin EC 81 MG tablet Take 81 mg by mouth daily with breakfast.     [provider]  ?atorvastatin (LIPITOR) 40 MG tablet Take 1 tablet (40 mg total) by mouth daily. 03/30/21   Ghimire,  Henreitta Leber, MD  ?dicyclomine (BENTYL) 20 MG tablet Take 1 tablet (20 mg total) by mouth 2 (two) times daily. 04/09/21   Deno Etienne, DO  ?finasteride (PROSCAR) 5 MG tablet Take 1 tablet (5 mg total) by mouth daily. 03/30/21   Ghimire, Henreitta Leber, MD  ?Multiple Vitamin (MULTIVITAMIN WITH MINERALS) TABS tablet Take 1 tablet by mouth daily.    [provider]  ?ondansetron (ZOFRAN-ODT) 4 MG disintegrating tablet '4mg'$  ODT q4 hours prn nausea/vomit 04/09/21   Deno Etienne, DO  ?Semaglutide (RYBELSUS) 14 MG TABS Take 1 tablet by mouth daily. Take 30 minutes before breakfast 12/06/20   [name], [name]  ?sildenafil (REVATIO) 20 MG tablet Take 1 tablet (20 mg total) by mouth daily as needed. 03/30/21   Ghimire, Henreitta Leber, MD  ?Tetrahydrozoline HCl (VISINE OP) Place 1 drop into both eyes daily as needed (itching/irritation).    [provider]  ?traZODone (DESYREL) 50 MG tablet Take 1 tablet (50 mg total) by mouth at bedtime as needed for sleep. 03/30/21   Ghimire, Henreitta Leber, MD  ?   ? ?Allergies    ?Haldol [haloperidol]   ? ?Review of Systems   ?Review of Systems  ?Constitutional:  Positive for appetite change.  ?Cardiovascular:  Positive for chest pain (Resolved).  ?Gastrointestinal:  Positive for abdominal pain (Chronic).  ?Psychiatric/Behavioral:  Positive for dysphoric mood. Negative for hallucinations,  self-injury and suicidal ideas. The patient is nervous/anxious.   ?All other systems reviewed and are negative. ? ?Physical Exam ?Updated Vital Signs ?BP 125/82 (BP Location: Right Arm)   Pulse 94   Temp 97.6 ?F (36.4 ?C) (Oral)   Resp 16   SpO2 97%  ?Physical Exam ?Vitals and nursing note reviewed.  ?Constitutional:   ?   General: He is not in acute distress. ?   Appearance: He is well-developed and normal weight. He is not ill-appearing, toxic-appearing or diaphoretic.  ?HENT:  ?   Head: Normocephalic and atraumatic.  ?Eyes:  ?   Extraocular Movements: Extraocular movements intact.  ?   Conjunctiva/sclera:  Conjunctivae normal.  ?Neck:  ?   Vascular: No JVD.  ?Cardiovascular:  ?   Rate and Rhythm: Normal rate and regular rhythm.  ?   Heart sounds: No murmur heard. ?Pulmonary:  ?   Effort: Pulmonary effort is normal. No tachypnea or respiratory distress.  ?   Breath sounds: Normal breath sounds. No wheezing, rhonchi or rales.  ?Chest:  ?   Chest wall: No tenderness.  ?Abdominal:  ?   Palpations: Abdomen is soft.  ?   Tenderness: There is no abdominal tenderness.  ?Musculoskeletal:     ?   General: No swelling.  ?   Cervical back: Normal range of motion and neck supple.  ?   Right lower leg: No edema.  ?   Left lower leg: No edema.  ?Skin: ?   General: Skin is warm and dry.  ?   Capillary Refill: Capillary refill takes less than 2 seconds.  ?Neurological:  ?   General: No focal deficit present.  ?   Mental Status: He is alert and oriented to person, place, and time.  ?   Cranial Nerves: No cranial nerve deficit.  ?   Motor: No weakness.  ?Psychiatric:     ?   Attention and Perception: Attention normal. He does not perceive auditory or visual hallucinations.     ?   Mood and Affect: Mood is depressed. Affect is tearful.     ?   Speech: Speech normal. Speech is not rapid and pressured, delayed, slurred or tangential.     ?   Behavior: Behavior is withdrawn. Behavior is cooperative.     ?   Thought Content: Thought content normal. Thought content does not include homicidal or suicidal ideation.  ? ? ?ED Results / Procedures / Treatments   ?Labs ?(all labs ordered are listed, but only abnormal results are displayed) ?Labs Reviewed  ?COMPREHENSIVE METABOLIC PANEL - Abnormal; Notable for the following components:  ?    Result Value  ? Glucose, Bld 278 (*)   ? BUN 31 (*)   ? Creatinine, Ser 1.81 (*)   ? GFR, Estimated 40 (*)   ? All other components within normal limits  ?LIPASE, BLOOD - Abnormal; Notable for the following components:  ? Lipase 61 (*)   ? All other components within normal limits  ?CBC WITH  DIFFERENTIAL/PLATELET - Abnormal; Notable for the following components:  ? RBC 5.97 (*)   ? Hemoglobin 17.7 (*)   ? HCT 53.6 (*)   ? All other components within normal limits  ?RESP PANEL BY RT-PCR (FLU A&B, COVID) ARPGX2  ?MAGNESIUM  ?URINALYSIS, ROUTINE W REFLEX MICROSCOPIC  ?TROPONIN I (HIGH SENSITIVITY)  ?TROPONIN I (HIGH SENSITIVITY)  ? ? ?EKG ?EKG Interpretation ? ?Date/Time:  Wednesday May 30 2021 11:19:07 EDT ?Ventricular Rate:  92 ?PR Interval:  128 ?QRS Duration: 75 ?QT Interval:  329 ?QTC Calculation: 407 ?R Axis:   13 ?Text Interpretation: Sinus rhythm Probable left atrial enlargement Confirmed by Godfrey Pick [phone]) on 05/30/2021 11:27:09 AM ? ?Radiology ?DG Chest 2 View ? ?Result Date: 05/29/2021 ?CLINICAL DATA:  Shortness of breath EXAM: CHEST - 2 VIEW COMPARISON:  Previous studies including the examination of 03/25/2021 FINDINGS: The heart size and mediastinal contours are within normal limits. Both lungs are clear. The visualized skeletal structures are unremarkable. IMPRESSION: No active cardiopulmonary disease. Electronically Signed   By: Elmer Picker M.D.   On: 05/29/2021 10:28  ? ?DG Chest Portable 1 View ? ?Result Date: 05/30/2021 ?CLINICAL DATA:  Chest discomfort for 1 week. EXAM: PORTABLE CHEST 1 VIEW COMPARISON:  Chest radiographs 05/29/2021 FINDINGS: The cardiomediastinal silhouette is unchanged with normal heart size. There is mild scarring in the left lung base. The right lung is clear. No pleural effusion or pneumothorax is identified. IMPRESSION: No active disease. Electronically Signed   By: Logan Bores M.D.   On: 05/30/2021 12:23   ? ?Procedures ?Procedures  ? ? ?Medications Ordered in ED ?Medications  ?lactated ringers bolus 1,000 mL (0 mLs Intravenous Stopped 05/30/21 1442)  ?diazepam (VALIUM) injection 2.5 mg (2.5 mg Intravenous Given 05/30/21 1250)  ? ? ?ED Course/ Medical Decision Making/ A&P ?  ?                        ?Medical Decision Making ?Amount and/or Complexity of  Data Reviewed ?Labs: ordered. ?Radiology: ordered. ? ?Risk ?Prescription drug management. ? ? ?This patient presents to the ED for concern of feeling sad, this involves an extensive number of treatment options, and is a complaint th

## 2021-05-30 NOTE — Discharge Instructions (Addendum)
See the attached paperwork for contact information for outpatient psychiatric care.  I do think speaking with a therapist will help you get through the depressive symptoms you are experiencing.  If you develop hopelessness or thoughts of harming yourself, please return to the emergency department.  If you experience any new or worsening physical symptoms, please return to the emergency department.  We are here anytime. ?

## 2021-06-11 DIAGNOSIS — L821 Other seborrheic keratosis: Secondary | ICD-10-CM | POA: Diagnosis not present

## 2021-06-11 DIAGNOSIS — L298 Other pruritus: Secondary | ICD-10-CM | POA: Diagnosis not present

## 2021-06-11 DIAGNOSIS — Z85828 Personal history of other malignant neoplasm of skin: Secondary | ICD-10-CM | POA: Diagnosis not present

## 2021-06-11 DIAGNOSIS — L245 Irritant contact dermatitis due to other chemical products: Secondary | ICD-10-CM | POA: Diagnosis not present

## 2021-07-10 ENCOUNTER — Other Ambulatory Visit: Payer: Self-pay

## 2021-07-10 ENCOUNTER — Encounter (HOSPITAL_COMMUNITY): Payer: Self-pay | Admitting: Emergency Medicine

## 2021-07-10 ENCOUNTER — Emergency Department (HOSPITAL_COMMUNITY)
Admission: EM | Admit: 2021-07-10 | Discharge: 2021-07-10 | Disposition: A | Discharge status: Home / Self Care | Payer: Medicare Other | Attending: Emergency Medicine | Admitting: Emergency Medicine

## 2021-07-10 DIAGNOSIS — D582 Other hemoglobinopathies: Secondary | ICD-10-CM | POA: Insufficient documentation

## 2021-07-10 DIAGNOSIS — Z7982 Long term (current) use of aspirin: Secondary | ICD-10-CM | POA: Diagnosis not present

## 2021-07-10 DIAGNOSIS — E1165 Type 2 diabetes mellitus with hyperglycemia: Secondary | ICD-10-CM | POA: Insufficient documentation

## 2021-07-10 DIAGNOSIS — Z79899 Other long term (current) drug therapy: Secondary | ICD-10-CM | POA: Diagnosis not present

## 2021-07-10 DIAGNOSIS — F32A Depression, unspecified: Secondary | ICD-10-CM | POA: Diagnosis not present

## 2021-07-10 DIAGNOSIS — R5381 Other malaise: Secondary | ICD-10-CM | POA: Diagnosis present

## 2021-07-10 DIAGNOSIS — I129 Hypertensive chronic kidney disease with stage 1 through stage 4 chronic kidney disease, or unspecified chronic kidney disease: Secondary | ICD-10-CM | POA: Insufficient documentation

## 2021-07-10 DIAGNOSIS — R6889 Other general symptoms and signs: Secondary | ICD-10-CM | POA: Diagnosis not present

## 2021-07-10 DIAGNOSIS — R519 Headache, unspecified: Secondary | ICD-10-CM | POA: Diagnosis not present

## 2021-07-10 DIAGNOSIS — R0689 Other abnormalities of breathing: Secondary | ICD-10-CM | POA: Diagnosis not present

## 2021-07-10 DIAGNOSIS — Z7984 Long term (current) use of oral hypoglycemic drugs: Secondary | ICD-10-CM | POA: Diagnosis not present

## 2021-07-10 DIAGNOSIS — N189 Chronic kidney disease, unspecified: Secondary | ICD-10-CM | POA: Insufficient documentation

## 2021-07-10 DIAGNOSIS — R7989 Other specified abnormal findings of blood chemistry: Secondary | ICD-10-CM | POA: Insufficient documentation

## 2021-07-10 DIAGNOSIS — E1122 Type 2 diabetes mellitus with diabetic chronic kidney disease: Secondary | ICD-10-CM | POA: Insufficient documentation

## 2021-07-10 DIAGNOSIS — R739 Hyperglycemia, unspecified: Secondary | ICD-10-CM | POA: Diagnosis not present

## 2021-07-10 LAB — CBG MONITORING, ED: Glucose-Capillary: 364 mg/dL — ABNORMAL HIGH (ref 70–99)

## 2021-07-10 LAB — BASIC METABOLIC PANEL
Anion gap: 10 (ref 5–15)
BUN: 35 mg/dL — ABNORMAL HIGH (ref 8–23)
CO2: 27 mmol/L (ref 22–32)
Calcium: 9.6 mg/dL (ref 8.9–10.3)
Chloride: 96 mmol/L — ABNORMAL LOW (ref 98–111)
Creatinine, Ser: 1.69 mg/dL — ABNORMAL HIGH (ref 0.61–1.24)
GFR, Estimated: 44 mL/min — ABNORMAL LOW (ref >60)
Glucose, Bld: 376 mg/dL — ABNORMAL HIGH (ref 70–99)
Potassium: 4.6 mmol/L (ref 3.5–5.1)
Sodium: 133 mmol/L — ABNORMAL LOW (ref 135–145)

## 2021-07-10 LAB — CBC
HCT: 51.5 % (ref 39.0–52.0)
Hemoglobin: 17.6 g/dL — ABNORMAL HIGH (ref 13.0–17.0)
MCH: 30.5 pg (ref 26.0–34.0)
MCHC: 34.2 g/dL (ref 30.0–36.0)
MCV: 89.3 fL (ref 80.0–100.0)
Platelets: 202 10*3/uL (ref 150–400)
RBC: 5.77 MIL/uL (ref 4.22–5.81)
RDW: 13 % (ref 11.5–15.5)
WBC: 9.1 10*3/uL (ref 4.0–10.5)
nRBC: 0 % (ref 0.0–0.2)

## 2021-07-10 LAB — RAPID URINE DRUG SCREEN, HOSP PERFORMED
Amphetamines: NOT DETECTED
Barbiturates: NOT DETECTED
Benzodiazepines: NOT DETECTED
Cocaine: NOT DETECTED
Opiates: NOT DETECTED
Tetrahydrocannabinol: NOT DETECTED

## 2021-07-10 MED ORDER — SODIUM CHLORIDE 0.9 % IV BOLUS: 1000.0000 mL | Freq: Once | INTRAVENOUS | Status: DC

## 2021-07-10 NOTE — ED Triage Notes (Signed)
Per [facility] pt reports feeling stressed and depressed since January. States his lady friend left him and he feels hopeless. States he bought a pill from a guy at the gas station for $5 to make him feel better. Unsure what pill was. States he was not trying to hurt himself.  ?

## 2021-07-10 NOTE — ED Provider Notes (Signed)
?[facility] ?Provider Note ? ? ?CSN: [id number] ?Arrival date & time: 07/10/21  1146 ? ?  ? ?History ? ?Chief Complaint  ?Patient presents with  ? Depression  ? ? ?[name] is a 66 y.o. male. ? ?66 year old with history of diabetes presents with complaint of feeling generally unwell since his "lady friend" in February, relates feeling poorly to his blood sugar staying in the 400s despite compliance with his PO diabetes medications (denies insulin use), as prescribed by his PCP (not on Epic). Denies CP, SHOB, abdominal pain. Admits he is depressed but denies SI or HI and does not wish to speak with TTS today.  ? ? ?  ? ?Home Medications ?Prior to Admission medications   ?Medication Sig Start Date End Date Taking? Authorizing Provider  ?acetaminophen (TYLENOL) 325 MG tablet Take 2 tablets (650 mg total) by mouth every 6 (six) hours as needed for mild pain (or Fever >/= 101). 02/12/20   Domenic Polite, MD  ?alfuzosin (UROXATRAL) 10 MG 24 hr tablet Take 1 tablet (10 mg total) by mouth daily with breakfast. 03/30/21   Ghimire, Henreitta Leber, MD  ?amLODipine (NORVASC) 5 MG tablet Take 1 tablet (5 mg total) by mouth daily. 01/07/21   Regalado, Belkys A, MD  ?ARIPiprazole (ABILIFY) 2 MG tablet Take 1 tablet (2 mg total) by mouth daily. 03/30/21   Ghimire, Henreitta Leber, MD  ?aspirin EC 81 MG tablet Take 81 mg by mouth daily with breakfast.     [provider]  ?atorvastatin (LIPITOR) 40 MG tablet Take 1 tablet (40 mg total) by mouth daily. 03/30/21   Ghimire, Henreitta Leber, MD  ?dicyclomine (BENTYL) 20 MG tablet Take 1 tablet (20 mg total) by mouth 2 (two) times daily. 04/09/21   Deno Etienne, DO  ?finasteride (PROSCAR) 5 MG tablet Take 1 tablet (5 mg total) by mouth daily. 03/30/21   Ghimire, Henreitta Leber, MD  ?Multiple Vitamin (MULTIVITAMIN WITH MINERALS) TABS tablet Take 1 tablet by mouth daily.    [provider]  ?ondansetron (ZOFRAN-ODT) 4 MG disintegrating tablet '4mg'$  ODT q4  hours prn nausea/vomit 04/09/21   Deno Etienne, DO  ?Semaglutide (RYBELSUS) 14 MG TABS Take 1 tablet by mouth daily. Take 30 minutes before breakfast 12/06/20   [name], [name]  ?sildenafil (REVATIO) 20 MG tablet Take 1 tablet (20 mg total) by mouth daily as needed. 03/30/21   Ghimire, Henreitta Leber, MD  ?Tetrahydrozoline HCl (VISINE OP) Place 1 drop into both eyes daily as needed (itching/irritation).    [provider]  ?traZODone (DESYREL) 50 MG tablet Take 1 tablet (50 mg total) by mouth at bedtime as needed for sleep. 03/30/21   Ghimire, Henreitta Leber, MD  ?   ? ?Allergies    ?Haldol [haloperidol]   ? ?Review of Systems   ?Review of Systems ?Negative except as per HPI ?Physical Exam ?Updated Vital Signs ?BP (!) 163/91   Pulse 79   Temp 98.4 ?F (36.9 ?C) (Oral)   Resp 16   Ht '5\' 6"'$  (1.676 m)   Wt 78.5 kg   SpO2 98%   BMI 27.92 kg/m?  ?Physical Exam ?Vitals and nursing note reviewed.  ?Constitutional:   ?   General: He is not in acute distress. ?   Appearance: He is well-developed. He is not diaphoretic.  ?HENT:  ?   Head: Normocephalic and atraumatic.  ?   Mouth/Throat:  ?   Mouth: Mucous membranes are dry.  ?Eyes:  ?  Conjunctiva/sclera: Conjunctivae normal.  ?   Pupils: Pupils are equal, round, and reactive to light.  ?Cardiovascular:  ?   Rate and Rhythm: Normal rate and regular rhythm.  ?   Heart sounds: Normal heart sounds.  ?Pulmonary:  ?   Effort: Pulmonary effort is normal.  ?   Breath sounds: Normal breath sounds.  ?Musculoskeletal:  ?   Right lower leg: No edema.  ?   Left lower leg: No edema.  ?Skin: ?   General: Skin is warm and dry.  ?   Findings: No erythema or rash.  ?Neurological:  ?   Mental Status: He is alert and oriented to person, place, and time.  ?Psychiatric:     ?   Mood and Affect: Affect is flat.     ?   Behavior: Behavior is withdrawn.     ?   Thought Content: Thought content does not include homicidal or suicidal ideation. Thought content does not include homicidal or suicidal  plan.  ? ? ?ED Results / Procedures / Treatments   ?Labs ?(all labs ordered are listed, but only abnormal results are displayed) ?Labs Reviewed  ?CBC - Abnormal; Notable for the following components:  ?    Result Value  ? Hemoglobin 17.6 (*)   ? All other components within normal limits  ?BASIC METABOLIC PANEL - Abnormal; Notable for the following components:  ? Sodium 133 (*)   ? Chloride 96 (*)   ? Glucose, Bld 376 (*)   ? BUN 35 (*)   ? Creatinine, Ser 1.69 (*)   ? GFR, Estimated 44 (*)   ? All other components within normal limits  ?CBG MONITORING, ED - Abnormal; Notable for the following components:  ? Glucose-Capillary 364 (*)   ? All other components within normal limits  ?RAPID URINE DRUG SCREEN, HOSP PERFORMED  ? ? ?EKG ?EKG Interpretation ? ?Date/Time:  Tuesday July 10 2021 14:17:24 EDT ?Ventricular Rate:  65 ?PR Interval:  136 ?QRS Duration: 76 ?QT Interval:  376 ?QTC Calculation: 391 ?R Axis:   8 ?Text Interpretation: Normal sinus rhythm with sinus arrhythmia Cannot rule out Anterior infarct , age undetermined Abnormal ECG When compared with ECG of 30-May-2021 11:19, PREVIOUS ECG IS PRESENT No significant change since last tracing Confirmed by Isla Pence [phone]) on 07/10/2021 2:36:10 PM ? ?Radiology ?No results found. ? ?Procedures ?Procedures  ? ? ?Medications Ordered in ED ?Medications  ?sodium chloride 0.9 % bolus 1,000 mL (1,000 mLs Intravenous Patient Refused/Not Given 07/10/21 1441)  ? ? ?ED Course/ Medical Decision Making/ A&P ?  ?                        ?Medical Decision Making ? ?This patient presents to the ED for concern of generally feeling unwell x 2 months, hyperglycemia, this involves an extensive number of treatment options, and is a complaint that carries with it a high risk of complications and morbidity.  The differential diagnosis includes dehydration, AKI, hyperglycemia, electrolyte derangement, depression  ? ? ?Co morbidities that complicate the patient evaluation ? ?Diabetes,  HTN, CKD, adjustment disorder, depressive disorder, HLD ? ? ?Additional history obtained: ? ?External records from outside source obtained and reviewed including recent labs from 05/30/21 ? ? ?Lab Tests: ? ?I Ordered, and personally interpreted labs.  The pertinent results include:  UDS negative, CBC with baseline lelvated hgb at 17.6, BMP with baseline Cr at 1.69, elevated glucose at 376, unchanged on CBG at 364 nearly  2 hours later (NPO in the ER).  ? ? ?Problem List / ED Course / Critical interventions / Medication management ? ?66yo male with generalized malaise as above. On going/chronic, likely related to depression, complicated by his poorly managed diabetes. Labs reviewed, given PO fluids and offered IV fluids but pt declines (After asking for IV fluids). Plan is for pt to monitor his BS and recheck with PCP.  ?I ordered medication including IVF  for hyperglycemia   ?Reevaluation of the patient after these medicines showed that the patient  refused treatment.  ?I have reviewed the patients home medicines and have made adjustments as needed ? ? ?Social Determinants of Health: ? ?PCP records not available for review  ? ? ?Test / Admission - Considered: ? ?Offered IV fluids to help manage pts hyperglycemia. Pt declines as he does not want to sit in the hallway chair for another hour and would like to leave. ? ? ? ? ? ? ? ? ?Final Clinical Impression(s) / ED Diagnoses ?Final diagnoses:  ?Hyperglycemia due to diabetes mellitus ([place])  ? ? ?Rx / DC Orders ?ED Discharge Orders   ? ? None  ? ?  ? ? ?  ?Tacy Learn, PA-C ?07/10/21 1449 ? ?  ?Isla Pence, MD ?07/10/21 1457 ? ?

## 2021-07-10 NOTE — Discharge Instructions (Signed)
Home to hydrate, recommend water. Monitor your blood sugar and follow up with your primary care provider.  ?

## 2021-07-10 NOTE — ED Provider Triage Note (Addendum)
Emergency Medicine Provider Triage Evaluation Note ? ?[name] , a 66 y.o. male  was evaluated in triage.  Pt complains of depression since February.  Patient states that on February 14 him and his "lady friend" broke up and since then he has been increasingly depressed.  Patient reports he lives at home alone in an apartment, has no family in the area.  Patient denies any SI/HI.  Patient reports that this morning he was so depressed, he went to a gas station near his house and purchased an unknown pill from an unknown man for $5.  Patient unsure what he took.  Patient here complaining of increased depression, feeling "out of it".  Patient also reports that he took unknown number of sleeping pills prior to arrival.  Patient denies any chest pain, shortness of breath, nausea, vomiting, lightheadedness or dizziness, abdominal pain.  When asked the patient if he wants to try and talk to a therapist or psychiatrist, he states that "none of that will help me".  Patient also reports elevated blood glucose as told to him by EMS.  Patient reports compliance on medication for type 2 diabetes.  Patient denies any excessive thirst, excessive hunger, excessive urination. ? ?Review of Systems  ?Positive:  ?Negative:  ? ?Physical Exam  ?BP (!) 160/94 (BP Location: Left Arm)   Pulse 88   Temp 98.4 ?F (36.9 ?C) (Oral)   Resp 14   Ht '5\' 6"'$  (1.676 m)   Wt 78.5 kg   SpO2 97%   BMI 27.92 kg/m?  ?Gen:   Awake, no distress   ?Resp:  Normal effort  ?MSK:   Moves extremities without difficulty  ?Other:  Patient alert and oriented x4.  Patient neuro exam shows no focal neurodeficits.  Denies SI/HI. ? ?Medical Decision Making  ?Medically screening exam initiated at 12:49 PM.  Appropriate orders placed.  [name] was informed that the remainder of the evaluation will be completed by another provider, this initial triage assessment does not replace that evaluation, and the importance of remaining in the ED until their evaluation  is complete. ? ? ?  ?Azucena Cecil, PA-C ?07/10/21 1251 ? ?  ?Azucena Cecil, PA-C ?07/10/21 1251 ? ?

## 2021-07-11 ENCOUNTER — Other Ambulatory Visit: Payer: Self-pay

## 2021-07-11 ENCOUNTER — Encounter (HOSPITAL_COMMUNITY): Payer: Self-pay | Admitting: Radiology

## 2021-07-11 ENCOUNTER — Emergency Department (HOSPITAL_COMMUNITY)
Admission: EM | Admit: 2021-07-11 | Discharge: 2021-07-11 | Disposition: A | Discharge status: Home / Self Care | Payer: Medicare Other | Attending: Student | Admitting: Student

## 2021-07-11 DIAGNOSIS — R739 Hyperglycemia, unspecified: Secondary | ICD-10-CM

## 2021-07-11 DIAGNOSIS — E1122 Type 2 diabetes mellitus with diabetic chronic kidney disease: Secondary | ICD-10-CM | POA: Insufficient documentation

## 2021-07-11 DIAGNOSIS — Z743 Need for continuous supervision: Secondary | ICD-10-CM | POA: Diagnosis not present

## 2021-07-11 DIAGNOSIS — Z7982 Long term (current) use of aspirin: Secondary | ICD-10-CM | POA: Insufficient documentation

## 2021-07-11 DIAGNOSIS — D631 Anemia in chronic kidney disease: Secondary | ICD-10-CM | POA: Diagnosis not present

## 2021-07-11 DIAGNOSIS — N183 Chronic kidney disease, stage 3 unspecified: Secondary | ICD-10-CM | POA: Insufficient documentation

## 2021-07-11 DIAGNOSIS — Z8546 Personal history of malignant neoplasm of prostate: Secondary | ICD-10-CM | POA: Insufficient documentation

## 2021-07-11 DIAGNOSIS — Z79899 Other long term (current) drug therapy: Secondary | ICD-10-CM | POA: Insufficient documentation

## 2021-07-11 DIAGNOSIS — E1165 Type 2 diabetes mellitus with hyperglycemia: Secondary | ICD-10-CM | POA: Insufficient documentation

## 2021-07-11 DIAGNOSIS — E871 Hypo-osmolality and hyponatremia: Secondary | ICD-10-CM | POA: Insufficient documentation

## 2021-07-11 DIAGNOSIS — R519 Headache, unspecified: Secondary | ICD-10-CM | POA: Insufficient documentation

## 2021-07-11 DIAGNOSIS — I129 Hypertensive chronic kidney disease with stage 1 through stage 4 chronic kidney disease, or unspecified chronic kidney disease: Secondary | ICD-10-CM | POA: Insufficient documentation

## 2021-07-11 DIAGNOSIS — Z8616 Personal history of COVID-19: Secondary | ICD-10-CM | POA: Insufficient documentation

## 2021-07-11 DIAGNOSIS — F32A Depression, unspecified: Secondary | ICD-10-CM | POA: Insufficient documentation

## 2021-07-11 LAB — COMPREHENSIVE METABOLIC PANEL
ALT: 31 U/L (ref 0–44)
AST: 21 U/L (ref 15–41)
Albumin: 3.8 g/dL (ref 3.5–5.0)
Alkaline Phosphatase: 121 U/L (ref 38–126)
Anion gap: 12 (ref 5–15)
BUN: 37 mg/dL — ABNORMAL HIGH (ref 8–23)
CO2: 25 mmol/L (ref 22–32)
Calcium: 9.6 mg/dL (ref 8.9–10.3)
Chloride: 97 mmol/L — ABNORMAL LOW (ref 98–111)
Creatinine, Ser: 1.69 mg/dL — ABNORMAL HIGH (ref 0.61–1.24)
GFR, Estimated: 44 mL/min — ABNORMAL LOW (ref >60)
Glucose, Bld: 456 mg/dL — ABNORMAL HIGH (ref 70–99)
Potassium: 4.5 mmol/L (ref 3.5–5.1)
Sodium: 134 mmol/L — ABNORMAL LOW (ref 135–145)
Total Bilirubin: 0.7 mg/dL (ref 0.3–1.2)
Total Protein: 7 g/dL (ref 6.5–8.1)

## 2021-07-11 LAB — CBC WITH DIFFERENTIAL/PLATELET
Abs Immature Granulocytes: 0.04 10*3/uL (ref 0.00–0.07)
Basophils Absolute: 0 10*3/uL (ref 0.0–0.1)
Basophils Relative: 1 %
Eosinophils Absolute: 0.2 10*3/uL (ref 0.0–0.5)
Eosinophils Relative: 2 %
HCT: 52.3 % — ABNORMAL HIGH (ref 39.0–52.0)
Hemoglobin: 17.8 g/dL — ABNORMAL HIGH (ref 13.0–17.0)
Immature Granulocytes: 1 %
Lymphocytes Relative: 14 %
Lymphs Abs: 1.1 10*3/uL (ref 0.7–4.0)
MCH: 30.5 pg (ref 26.0–34.0)
MCHC: 34 g/dL (ref 30.0–36.0)
MCV: 89.6 fL (ref 80.0–100.0)
Monocytes Absolute: 0.5 10*3/uL (ref 0.1–1.0)
Monocytes Relative: 7 %
Neutro Abs: 5.5 10*3/uL (ref 1.7–7.7)
Neutrophils Relative %: 75 %
Platelets: 204 10*3/uL (ref 150–400)
RBC: 5.84 MIL/uL — ABNORMAL HIGH (ref 4.22–5.81)
RDW: 13.2 % (ref 11.5–15.5)
WBC: 7.3 10*3/uL (ref 4.0–10.5)
nRBC: 0 % (ref 0.0–0.2)

## 2021-07-11 LAB — URINALYSIS, ROUTINE W REFLEX MICROSCOPIC
Bacteria, UA: NONE SEEN
Bilirubin Urine: NEGATIVE
Glucose, UA: >=500 mg/dL — AB
Hgb urine dipstick: NEGATIVE
Ketones, ur: NEGATIVE mg/dL
Leukocytes,Ua: NEGATIVE
Nitrite: NEGATIVE
Protein, ur: 30 mg/dL — AB
Specific Gravity, Urine: 1.023 (ref 1.005–1.030)
pH: 6 (ref 5.0–8.0)

## 2021-07-11 LAB — CBG MONITORING, ED
Glucose-Capillary: 499 mg/dL — ABNORMAL HIGH (ref 70–99)
Glucose-Capillary: 478 mg/dL — ABNORMAL HIGH (ref 70–99)

## 2021-07-11 LAB — BETA-HYDROXYBUTYRIC ACID: Beta-Hydroxybutyric Acid: 0.1 mmol/L (ref 0.05–0.27)

## 2021-07-11 LAB — BLOOD GAS, VENOUS
Acid-Base Excess: 4.6 mmol/L — ABNORMAL HIGH (ref 0.0–2.0)
Bicarbonate: 29.9 mmol/L — ABNORMAL HIGH (ref 20.0–28.0)
O2 Saturation: 79.4 %
Patient temperature: 37
pCO2, Ven: 45 mmHg (ref 44–60)
pH, Ven: 7.43 (ref 7.25–7.43)
pO2, Ven: 44 mmHg (ref 32–45)

## 2021-07-11 LAB — ETHANOL: Alcohol, Ethyl (B): <10 mg/dL (ref <10)

## 2021-07-11 LAB — TROPONIN I (HIGH SENSITIVITY): Troponin I (High Sensitivity): 6 ng/L (ref <18)

## 2021-07-11 MED ORDER — INSULIN ASPART 100 UNIT/ML IJ SOLN
10.0000 [IU] | Freq: Once | INTRAMUSCULAR | Status: DC
  Filled 2021-07-11: qty 0.1

## 2021-07-11 MED ORDER — DIPHENHYDRAMINE HCL 50 MG/ML IJ SOLN
25.0000 mg | Freq: Once | INTRAMUSCULAR | Status: AC
  Administered 2021-07-11: 25 mg via INTRAVENOUS
  Filled 2021-07-11: qty 1

## 2021-07-11 MED ORDER — PROCHLORPERAZINE EDISYLATE 10 MG/2ML IJ SOLN
10.0000 mg | Freq: Once | INTRAMUSCULAR | Status: AC
  Administered 2021-07-11: 10 mg via INTRAVENOUS
  Filled 2021-07-11: qty 2

## 2021-07-11 MED ORDER — LACTATED RINGERS IV BOLUS
1000.0000 mL | Freq: Once | INTRAVENOUS | Status: AC
  Administered 2021-07-11: 1000 mL via INTRAVENOUS

## 2021-07-11 NOTE — ED Notes (Signed)
When this nurse went to room to give insulin and give discharge papers. Pt had taken his own IV out and bleed all over the floor and sink as well as disconnected himself from tele and BP cuff and was no longer in the room. Pt did not receive his insulin or discharge papers prior to leaving. ?

## 2021-07-11 NOTE — ED Provider Notes (Signed)
?[facility] DEPT ?Provider Note ? ?CSN: [id number] ?Arrival date & time: 07/11/21 1505 ? ?Chief Complaint(s) ?Headache and Chest Pain ? ?HPI ?[name] is a 66 y.o. male with PMH T2DM, HTN, CKD who presents emergency department for evaluation of headache and depression.  Patient was seen yesterday for the exact same symptoms.  He states that he has been feeling down since his girlfriend broke up with him.  However, he does not currently endorse any suicidal ideation, homicidal ideation, auditory or visual hallucinations.  He states that he had a headache today and called EMS to bring him to the hospital.  Dates he is concerned that his sugar is high.  There is initial chief complaint of chest pain but the patient is currently not complaining of chest pain.  He denies chest pain, shortness of breath, abdominal pain, nausea, vomiting or other systemic symptoms. ? ? ?Past Medical History ?Past Medical History:  ?Diagnosis Date  ? Acute kidney injury superimposed on chronic kidney disease ([place]) 02/10/2020  ? Acute on chronic kidney failure ([place]) 09/28/2019  ? Closed nondisplaced spiral fracture of shaft of right tibia   ? Complicated urinary tract infection 09/06/2016  ? Depression   ? Diabetes mellitus without complication ([place])   ? GERD (gastroesophageal reflux disease)   ? Gout   ? right foot  ? Hemorrhoids   ? Hypertension   ? Hypertension   ? Macular degeneration   ? Microcytic anemia   ? Prostate cancer ([place]) 08/2009  ? Rectal bleeding 07/31/2016  ? Tubular adenoma   ? ?Patient Active Problem List  ? Diagnosis Date Noted  ? PNA (pneumonia) 03/26/2021  ? History of prostate cancer with recurrent urinary obstruction  03/24/2021  ? Polycythemia 03/24/2021  ? Community acquired pneumonia 03/24/2021  ? COVID-19 virus infection 03/24/2021  ? History of noncompliance with medical treatment 03/24/2021  ? Type 2 diabetes mellitus ([place]) 01/05/2021  ? HLD (hyperlipidemia) 01/05/2021  ? Acute  urinary retention 10/25/2020  ? Adjustment disorder with mixed anxiety and depressed mood   ? Acute renal failure superimposed on stage 3a chronic kidney disease ([place]) 09/15/2020  ? Ataxia 08/01/2020  ? Hydroureteronephrosis 02/10/2020  ? Nausea and vomiting 02/10/2020  ? Pneumonia due to COVID-19 virus 09/29/2019  ? Hyponatremia 09/28/2019  ? Sinus tachycardia 09/28/2019  ? Insomnia 08/26/2018  ? Nephropathy 03/26/2018  ? Urinary urgency 03/26/2018  ? Type 2 diabetes mellitus with diabetic nephropathy, without long-term current use of insulin ([place]) 03/26/2018  ? Essential hypertension 03/26/2018  ? Chronic kidney disease (CKD), stage III (moderate) ([place]) 03/26/2018  ? Conductive hearing loss, bilateral 11/07/2017  ? Uncontrolled type 2 diabetes mellitus with hyperglycemia ([place]) 07/31/2016  ? Microcytic anemia 02/17/2012  ? 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 Benign hypertension 02/17/2012  ? Depressive disorder 02/17/2012  ? ?Home Medication(s) ?Prior to Admission medications   ?Medication Sig Start Date End Date Taking? Authorizing Provider  ?acetaminophen (TYLENOL) 325 MG tablet Take 2 tablets (650 mg total) by mouth every 6 (six) hours as needed for mild pain (or Fever >/= 101). 02/12/20   Domenic Polite, MD  ?alfuzosin (UROXATRAL) 10 MG 24 hr tablet Take 1 tablet (10 mg total) by mouth daily with breakfast. 03/30/21   Ghimire, Henreitta Leber, MD  ?amLODipine (NORVASC) 5 MG tablet Take 1 tablet (5 mg total) by mouth daily. 01/07/21   Regalado, Belkys A, MD  ?ARIPiprazole (ABILIFY) 2 MG tablet Take 1 tablet (2 mg total) by mouth daily. 03/30/21  Jonetta Osgood, MD  ?aspirin EC 81 MG tablet Take 81 mg by mouth daily with breakfast.     [provider]  ?atorvastatin (LIPITOR) 40 MG tablet Take 1 tablet (40 mg total) by mouth daily. 03/30/21   Ghimire, Henreitta Leber, MD  ?dicyclomine (BENTYL) 20 MG tablet Take 1 tablet (20 mg total) by mouth 2 (two) times daily. 04/09/21   Deno Etienne, DO  ?finasteride (PROSCAR)  5 MG tablet Take 1 tablet (5 mg total) by mouth daily. 03/30/21   Ghimire, Henreitta Leber, MD  ?Multiple Vitamin (MULTIVITAMIN WITH MINERALS) TABS tablet Take 1 tablet by mouth daily.    [provider]  ?ondansetron (ZOFRAN-ODT) 4 MG disintegrating tablet '4mg'$  ODT q4 hours prn nausea/vomit 04/09/21   Deno Etienne, DO  ?Semaglutide (RYBELSUS) 14 MG TABS Take 1 tablet by mouth daily. Take 30 minutes before breakfast 12/06/20   [name], [name]  ?sildenafil (REVATIO) 20 MG tablet Take 1 tablet (20 mg total) by mouth daily as needed. 03/30/21   Ghimire, Henreitta Leber, MD  ?Tetrahydrozoline HCl (VISINE OP) Place 1 drop into both eyes daily as needed (itching/irritation).    [provider]  ?traZODone (DESYREL) 50 MG tablet Take 1 tablet (50 mg total) by mouth at bedtime as needed for sleep. 03/30/21   Ghimire, Henreitta Leber, MD  ?                                                                                                                                  ?Past Surgical History ?Past Surgical History:  ?Procedure Laterality Date  ? CYSTOSCOPY WITH RETROGRADE PYELOGRAM, URETEROSCOPY AND STENT PLACEMENT Right 07/31/2016  ? Procedure: CYSTOSCOPY WITH RIGHT  RETROGRADE PYELOGRAM, URETEROSCOPY;  Surgeon: [name], MD;  Location: [facility];  Service: Urology;  Laterality: Right;  ? INGUINAL HERNIA REPAIR    ? Left ankle joint fusion  1981  ? PROSTATE BIOPSY    ? x 2  ? URETERAL REIMPLANTION  07/31/2016  ? Procedure: URETERAL REIMPLANT, right boari flap, right psoas hitch;  Surgeon: [name], MD;  Location: [facility];  Service: Urology;;  ? ?Family History ?Family History  ?Problem Relation Age of Onset  ? Lung cancer Mother   ?     Deceased  ? Pancreatic cancer Father   ? Heart disease Father   ?     Deceased  ? Throat cancer Brother   ? Lung cancer Maternal Uncle   ?     nephew  ? Diabetes Other   ? ? ?Social History ?Social History  ? ?Tobacco Use  ? Smoking status: Never  ? Smokeless tobacco: Never  ?Vaping  Use  ? Vaping Use: Never used  ?Substance Use Topics  ? Alcohol use: Not Currently  ? Drug use: No  ? ?Allergies ?Haldol [haloperidol] ? ?Review of Systems ?Review of Systems  ?Neurological:  Positive for headaches.  ? ?Physical Exam ?Vital  Signs  ?I have reviewed the triage vital signs ?BP 109/71   Pulse 69   Temp 98.1 ?F (36.7 ?C) (Oral)   Resp 15   Ht '5\' 6"'$  (1.676 m)   Wt 78.5 kg   SpO2 96%   BMI 27.92 kg/m?  ? ?Physical Exam ?Vitals and nursing note reviewed.  ?Constitutional:   ?   General: He is not in acute distress. ?   Appearance: He is well-developed.  ?HENT:  ?   Head: Normocephalic and atraumatic.  ?Eyes:  ?   Conjunctiva/sclera: Conjunctivae normal.  ?Cardiovascular:  ?   Rate and Rhythm: Normal rate and regular rhythm.  ?   Heart sounds: No murmur heard. ?Pulmonary:  ?   Effort: Pulmonary effort is normal. No respiratory distress.  ?   Breath sounds: Normal breath sounds.  ?Abdominal:  ?   Palpations: Abdomen is soft.  ?   Tenderness: There is no abdominal tenderness.  ?Musculoskeletal:     ?   General: No swelling.  ?   Cervical back: Neck supple.  ?Skin: ?   General: Skin is warm and dry.  ?   Capillary Refill: Capillary refill takes less than 2 seconds.  ?Neurological:  ?   Mental Status: He is alert.  ?   Cranial Nerves: No cranial nerve deficit or dysarthria.  ?   Sensory: No sensory deficit.  ?   Motor: No weakness.  ?Psychiatric:     ?   Mood and Affect: Mood normal.  ? ? ?ED Results and Treatments ?Labs ?(all labs ordered are listed, but only abnormal results are displayed) ?Labs Reviewed  ?URINALYSIS, ROUTINE W REFLEX MICROSCOPIC - Abnormal; Notable for the following components:  ?    Result Value  ? Color, Urine STRAW (*)   ? Glucose, UA >=500 (*)   ? Protein, ur 30 (*)   ? All other components within normal limits  ?CBC WITH DIFFERENTIAL/PLATELET - Abnormal; Notable for the following components:  ? RBC 5.84 (*)   ? Hemoglobin 17.8 (*)   ? HCT 52.3 (*)   ? All other components within  normal limits  ?COMPREHENSIVE METABOLIC PANEL - Abnormal; Notable for the following components:  ? Sodium 134 (*)   ? Chloride 97 (*)   ? Glucose, Bld 456 (*)   ? BUN 37 (*)   ? Creatinine, Ser 1.69 (*)   ? GFR

## 2021-07-11 NOTE — ED Triage Notes (Signed)
Pt picked up from [employer] with complaints of a headache. Per EMS pt had been drinking but was unsure how much. Pt was seen at [facility] yesterday for the same. ?

## 2021-07-19 ENCOUNTER — Encounter (HOSPITAL_COMMUNITY): Payer: Self-pay | Admitting: Emergency Medicine

## 2021-07-19 ENCOUNTER — Other Ambulatory Visit: Payer: Self-pay

## 2021-07-19 ENCOUNTER — Emergency Department (HOSPITAL_COMMUNITY)
Admission: EM | Admit: 2021-07-19 | Discharge: 2021-07-19 | Disposition: A | Discharge status: Home / Self Care | Payer: Medicare Other | Attending: Emergency Medicine | Admitting: Emergency Medicine

## 2021-07-19 ENCOUNTER — Emergency Department (HOSPITAL_COMMUNITY): Payer: Medicare Other

## 2021-07-19 DIAGNOSIS — Z5321 Procedure and treatment not carried out due to patient leaving prior to being seen by health care provider: Secondary | ICD-10-CM | POA: Insufficient documentation

## 2021-07-19 DIAGNOSIS — R079 Chest pain, unspecified: Secondary | ICD-10-CM | POA: Insufficient documentation

## 2021-07-19 LAB — BASIC METABOLIC PANEL
Anion gap: 12 (ref 5–15)
BUN: 33 mg/dL — ABNORMAL HIGH (ref 8–23)
CO2: 19 mmol/L — ABNORMAL LOW (ref 22–32)
Calcium: 9.5 mg/dL (ref 8.9–10.3)
Chloride: 104 mmol/L (ref 98–111)
Creatinine, Ser: 1.42 mg/dL — ABNORMAL HIGH (ref 0.61–1.24)
GFR, Estimated: 54 mL/min — ABNORMAL LOW (ref >60)
Glucose, Bld: 406 mg/dL — ABNORMAL HIGH (ref 70–99)
Potassium: 4.7 mmol/L (ref 3.5–5.1)
Sodium: 135 mmol/L (ref 135–145)

## 2021-07-19 LAB — TROPONIN I (HIGH SENSITIVITY): Troponin I (High Sensitivity): 6 ng/L (ref <18)

## 2021-07-19 NOTE — ED Triage Notes (Signed)
BIBA ?Per EMS: Pt coming from home w/ c/o chest pain x4 hours. Pt took 9 Viagra over the last 6 hours ?324 ASA given en route  ?Pt refused IV by EMS  ?12 lead normal  ?VSS  ?132/80 ?70HR  ?100% RA  ? ?

## 2021-07-19 NOTE — ED Notes (Signed)
Pt ambulated with steady gait, aox4 out of room. States he is leaving ED. Explained to pt risk of leaving without seeing provider/leaving before treatment. Pt verbalizes understanding and states he would still like to leave. Ambulated out of ED with steady gait, clear speech.  ?

## 2021-07-21 ENCOUNTER — Emergency Department (HOSPITAL_COMMUNITY)
Admission: EM | Admit: 2021-07-21 | Discharge: 2021-07-21 | Discharge status: Against Medical Advice | Payer: Medicare Other | Attending: Emergency Medicine | Admitting: Emergency Medicine

## 2021-07-21 ENCOUNTER — Encounter (HOSPITAL_COMMUNITY): Payer: Self-pay | Admitting: Emergency Medicine

## 2021-07-21 ENCOUNTER — Other Ambulatory Visit: Payer: Self-pay

## 2021-07-21 DIAGNOSIS — Z7982 Long term (current) use of aspirin: Secondary | ICD-10-CM | POA: Diagnosis not present

## 2021-07-21 DIAGNOSIS — R269 Unspecified abnormalities of gait and mobility: Secondary | ICD-10-CM | POA: Insufficient documentation

## 2021-07-21 DIAGNOSIS — R42 Dizziness and giddiness: Secondary | ICD-10-CM | POA: Diagnosis not present

## 2021-07-21 DIAGNOSIS — R251 Tremor, unspecified: Secondary | ICD-10-CM | POA: Diagnosis not present

## 2021-07-21 DIAGNOSIS — Z8546 Personal history of malignant neoplasm of prostate: Secondary | ICD-10-CM | POA: Insufficient documentation

## 2021-07-21 DIAGNOSIS — R739 Hyperglycemia, unspecified: Secondary | ICD-10-CM | POA: Diagnosis not present

## 2021-07-21 DIAGNOSIS — N1831 Chronic kidney disease, stage 3a: Secondary | ICD-10-CM | POA: Diagnosis not present

## 2021-07-21 DIAGNOSIS — R0789 Other chest pain: Secondary | ICD-10-CM | POA: Diagnosis not present

## 2021-07-21 DIAGNOSIS — Z79899 Other long term (current) drug therapy: Secondary | ICD-10-CM | POA: Insufficient documentation

## 2021-07-21 DIAGNOSIS — E1165 Type 2 diabetes mellitus with hyperglycemia: Secondary | ICD-10-CM | POA: Insufficient documentation

## 2021-07-21 DIAGNOSIS — R079 Chest pain, unspecified: Secondary | ICD-10-CM | POA: Diagnosis not present

## 2021-07-21 DIAGNOSIS — I129 Hypertensive chronic kidney disease with stage 1 through stage 4 chronic kidney disease, or unspecified chronic kidney disease: Secondary | ICD-10-CM | POA: Insufficient documentation

## 2021-07-21 DIAGNOSIS — Z743 Need for continuous supervision: Secondary | ICD-10-CM | POA: Diagnosis not present

## 2021-07-21 LAB — BASIC METABOLIC PANEL
Anion gap: 14 (ref 5–15)
BUN: 33 mg/dL — ABNORMAL HIGH (ref 8–23)
CO2: 20 mmol/L — ABNORMAL LOW (ref 22–32)
Calcium: 9.5 mg/dL (ref 8.9–10.3)
Chloride: 99 mmol/L (ref 98–111)
Creatinine, Ser: 1.67 mg/dL — ABNORMAL HIGH (ref 0.61–1.24)
GFR, Estimated: 45 mL/min — ABNORMAL LOW (ref >60)
Glucose, Bld: 367 mg/dL — ABNORMAL HIGH (ref 70–99)
Potassium: 4.3 mmol/L (ref 3.5–5.1)
Sodium: 133 mmol/L — ABNORMAL LOW (ref 135–145)

## 2021-07-21 LAB — CBC
HCT: 51.5 % (ref 39.0–52.0)
Hemoglobin: 17.8 g/dL — ABNORMAL HIGH (ref 13.0–17.0)
MCH: 30.3 pg (ref 26.0–34.0)
MCHC: 34.6 g/dL (ref 30.0–36.0)
MCV: 87.7 fL (ref 80.0–100.0)
Platelets: 195 10*3/uL (ref 150–400)
RBC: 5.87 MIL/uL — ABNORMAL HIGH (ref 4.22–5.81)
RDW: 12.5 % (ref 11.5–15.5)
WBC: 8.7 10*3/uL (ref 4.0–10.5)
nRBC: 0 % (ref 0.0–0.2)

## 2021-07-21 LAB — TROPONIN I (HIGH SENSITIVITY): Troponin I (High Sensitivity): 6 ng/L (ref <18)

## 2021-07-21 MED ORDER — SODIUM CHLORIDE 0.9 % IV BOLUS: 1000.0000 mL | Freq: Once | INTRAVENOUS | Status: DC

## 2021-07-21 NOTE — ED Notes (Signed)
Went over [employer] instructions w the pt. Pt verbalized understanding.  ?

## 2021-07-21 NOTE — ED Triage Notes (Signed)
Pt to triage via [facility] from street.  Reports chest pain x 2 days.  Seen at [facility] 2 days ago for chest pain and left prior to EDP exam.  Refused IV and EKG.   ? ?CBG 405 ?

## 2021-07-21 NOTE — ED Provider Notes (Signed)
?[facility] ?Provider Note ? ? ?CSN: [id number] ?Arrival date & time: 07/21/21  1509 ? ?  ? ?History ? ?Chief Complaint  ?Patient presents with  ? Chest Pain  ? ? ?[name] is a 66 y.o. male. ? ? ?Chest Pain ?Associated symptoms: dizziness   ?Associated symptoms: no fever, no shortness of breath and no weakness   ? ?66 year old male present emergency department with 3-day history of chest tightness.  He says that the pain is constant in nature.  It is not exacerbated with physical activity and no associated shortness of breath.  Patient also complains of dizziness/lightheadedness that has been going on for months.  He also notes ringing in his ears that is occurred over the past 2 weeks.  Patient denies chest pain, shortness of breath abdominal pain, nausea/vomiting/diarrhea, fever, chills, night sweats, changes in vision/taste/smell/hearing, eyes/ear pain, urinary symptoms, change in bowel habits. ? ?Patient is back to his past medical history significant for hypertension, microcytic anemia, uncontrolled diabetes mellitus, prostate cancer, chronic kidney failure stage IIIa, GERD ? ?Home Medications ?Prior to Admission medications   ?Medication Sig Start Date End Date Taking? Authorizing Provider  ?acetaminophen (TYLENOL) 325 MG tablet Take 2 tablets (650 mg total) by mouth every 6 (six) hours as needed for mild pain (or Fever >/= 101). 02/12/20   Domenic Polite, MD  ?alfuzosin (UROXATRAL) 10 MG 24 hr tablet Take 1 tablet (10 mg total) by mouth daily with breakfast. 03/30/21   Ghimire, Henreitta Leber, MD  ?amLODipine (NORVASC) 5 MG tablet Take 1 tablet (5 mg total) by mouth daily. 01/07/21   Regalado, Belkys A, MD  ?ARIPiprazole (ABILIFY) 2 MG tablet Take 1 tablet (2 mg total) by mouth daily. 03/30/21   Ghimire, Henreitta Leber, MD  ?aspirin EC 81 MG tablet Take 81 mg by mouth daily with breakfast.     [provider]  ?atorvastatin (LIPITOR) 40 MG tablet Take 1 tablet (40 mg  total) by mouth daily. 03/30/21   Ghimire, Henreitta Leber, MD  ?dicyclomine (BENTYL) 20 MG tablet Take 1 tablet (20 mg total) by mouth 2 (two) times daily. 04/09/21   Deno Etienne, DO  ?finasteride (PROSCAR) 5 MG tablet Take 1 tablet (5 mg total) by mouth daily. 03/30/21   Ghimire, Henreitta Leber, MD  ?Multiple Vitamin (MULTIVITAMIN WITH MINERALS) TABS tablet Take 1 tablet by mouth daily.    [provider]  ?ondansetron (ZOFRAN-ODT) 4 MG disintegrating tablet '4mg'$  ODT q4 hours prn nausea/vomit 04/09/21   Deno Etienne, DO  ?Semaglutide (RYBELSUS) 14 MG TABS Take 1 tablet by mouth daily. Take 30 minutes before breakfast 12/06/20   [name], [name]  ?sildenafil (REVATIO) 20 MG tablet Take 1 tablet (20 mg total) by mouth daily as needed. 03/30/21   Ghimire, Henreitta Leber, MD  ?Tetrahydrozoline HCl (VISINE OP) Place 1 drop into both eyes daily as needed (itching/irritation).    [provider]  ?traZODone (DESYREL) 50 MG tablet Take 1 tablet (50 mg total) by mouth at bedtime as needed for sleep. 03/30/21   Ghimire, Henreitta Leber, MD  ?   ? ?Allergies    ?Haldol [haloperidol]   ? ?Review of Systems   ?Review of Systems  ?Constitutional:  Negative for chills and fever.  ?HENT:  Negative for congestion.   ?Eyes:  Negative for pain.  ?Respiratory:  Negative for shortness of breath and wheezing.   ?Cardiovascular:  Positive for chest pain.  ?Gastrointestinal:  Negative for diarrhea.  ?Genitourinary:  Negative  for dysuria and urgency.  ?Musculoskeletal:  Negative for arthralgias.  ?Neurological:  Positive for dizziness and light-headedness. Negative for seizures, syncope and weakness.  ?All other systems reviewed and are negative. ? ?Physical Exam ?Updated Vital Signs ?BP (!) 148/81   Pulse 81   Temp 98.5 ?F (36.9 ?C) (Oral)   Resp 20   SpO2 99%  ?Physical Exam ?Vitals and nursing note reviewed.  ?Constitutional:   ?   General: He is not in acute distress. ?   Appearance: He is well-developed. He is obese. He is not ill-appearing,  toxic-appearing or diaphoretic.  ?HENT:  ?   Head: Normocephalic and atraumatic.  ?Eyes:  ?   General: Visual field deficit present.  ?   Pupils: Pupils are equal, round, and reactive to light.  ?   Comments: He remains nonintact.  Patient seems to have visual field deficits in the periphery bilaterally both upper and lower.  ?Cardiovascular:  ?   Rate and Rhythm: Normal rate and regular rhythm.  ?   Heart sounds: Normal heart sounds. No murmur heard. ?Pulmonary:  ?   Effort: Pulmonary effort is normal.  ?   Breath sounds: Normal breath sounds.  ?Chest:  ?   Chest wall: No tenderness.  ?Abdominal:  ?   General: Bowel sounds are normal.  ?   Palpations: Abdomen is soft.  ?Musculoskeletal:     ?   General: Normal range of motion.  ?   Cervical back: Normal range of motion and neck supple.  ?Skin: ?   General: Skin is warm and dry.  ?   Capillary Refill: Capillary refill takes less than 2 seconds.  ?Neurological:  ?   Mental Status: He is alert.  ?   Cranial Nerves: No dysarthria or facial asymmetry.  ?   Sensory: Sensation is intact.  ?   Motor: Tremor present. No weakness, atrophy, seizure activity or pronator drift.  ?   Coordination: Romberg sign negative. Coordination normal. Finger-Nose-Finger Test abnormal. Heel to Shin Test normal.  ?   Gait: Gait abnormal.  ?   Deep Tendon Reflexes: Reflexes are normal and symmetric.  ?   Comments: Cranial nerves III through XII grossly intact.  No sensory deficits along major nerve distributions of upper and lower extremities.  Radial posterior tibial pulses equal full bilaterally.  DTRs symmetric and equal.  Muscle strength 5 out of 5 for bilateral upper extremities including: Grip strength, elbow flexion extension; lower extremities: Ankle dorsi/plantarflexion, knee flexion and extension. ? ?Patient elicited abnormal finger-nose when tested in periphery. ? ?Resting tremor noted bilaterally lower extremities-pill-rolling in nature.  Patient was also elicited shuffling gait  when standing up to assess gait.  ?Psychiatric:     ?   Mood and Affect: Mood normal.     ?   Behavior: Behavior normal.  ? ? ?ED Results / Procedures / Treatments   ?Labs ?(all labs ordered are listed, but only abnormal results are displayed) ?Labs Reviewed  ?BASIC METABOLIC PANEL - Abnormal; Notable for the following components:  ?    Result Value  ? Sodium 133 (*)   ? CO2 20 (*)   ? Glucose, Bld 367 (*)   ? BUN 33 (*)   ? Creatinine, Ser 1.67 (*)   ? GFR, Estimated 45 (*)   ? All other components within normal limits  ?CBC - Abnormal; Notable for the following components:  ? RBC 5.87 (*)   ? Hemoglobin 17.8 (*)   ? All other  components within normal limits  ?TROPONIN I (HIGH SENSITIVITY)  ? ? ?EKG ?EKG Interpretation ? ?Date/Time:  Saturday Jul 21 2021 15:17:10 EDT ?Ventricular Rate:  93 ?PR Interval:  138 ?QRS Duration: 66 ?QT Interval:  328 ?QTC Calculation: 407 ?R Axis:   -7 ?Text Interpretation:  Poor data quality, interpretation may be adversely affected Sinus rhythm with Fusion complexes Possible Anterior infarct , age undetermined Abnormal ECG When compared with ECG of 19-Jul-2021 12:05, PREVIOUS ECG IS PRESENT since last tracing no significant change Confirmed by Noemi Chapel [phone]) on 07/21/2021 4:52:33 PM ? ?Radiology ?No results found. ? ?Procedures ?Procedures  ? ? ?Medications Ordered in ED ?Medications  ?sodium chloride 0.9 % bolus 1,000 mL (has no administration in time range)  ? ? ?ED Course/ Medical Decision Making/ A&P ?  ?                        ?Medical Decision Making ?Amount and/or Complexity of Data Reviewed ?Labs: ordered. ?Radiology: ordered. ? ? ?This patient presents to the ED for concern of chest pain, this involves an extensive number of treatment options, and is a complaint that carries with it a high risk of complications and morbidity.  The differential diagnosis includes ACS, pancreatitis, pneumothorax, aortic dissection, pneumonia, pleurisy, pericarditis, thoracic aortic  aneurysm ? ? ?Co morbidities that complicate the patient evaluation ? ?hypertension, microcytic anemia, uncontrolled diabetes mellitus, prostate cancer, chronic kidney failure stage IIIa, GERD ? ? ?Additional histor

## 2021-07-24 DIAGNOSIS — G319 Degenerative disease of nervous system, unspecified: Secondary | ICD-10-CM | POA: Diagnosis not present

## 2021-07-24 DIAGNOSIS — R059 Cough, unspecified: Secondary | ICD-10-CM | POA: Diagnosis not present

## 2021-07-24 DIAGNOSIS — R3915 Urgency of urination: Secondary | ICD-10-CM | POA: Diagnosis not present

## 2021-07-24 DIAGNOSIS — R5383 Other fatigue: Secondary | ICD-10-CM | POA: Diagnosis not present

## 2021-07-24 DIAGNOSIS — R2689 Other abnormalities of gait and mobility: Secondary | ICD-10-CM | POA: Diagnosis not present

## 2021-07-24 DIAGNOSIS — R6889 Other general symptoms and signs: Secondary | ICD-10-CM | POA: Diagnosis not present

## 2021-07-24 DIAGNOSIS — R739 Hyperglycemia, unspecified: Secondary | ICD-10-CM | POA: Diagnosis not present

## 2021-07-24 DIAGNOSIS — E1142 Type 2 diabetes mellitus with diabetic polyneuropathy: Secondary | ICD-10-CM | POA: Diagnosis not present

## 2021-07-24 DIAGNOSIS — G9389 Other specified disorders of brain: Secondary | ICD-10-CM | POA: Diagnosis not present

## 2021-07-24 DIAGNOSIS — R531 Weakness: Secondary | ICD-10-CM | POA: Diagnosis not present

## 2021-07-24 DIAGNOSIS — Z79899 Other long term (current) drug therapy: Secondary | ICD-10-CM | POA: Diagnosis not present

## 2021-07-24 DIAGNOSIS — R9082 White matter disease, unspecified: Secondary | ICD-10-CM | POA: Diagnosis not present

## 2021-07-24 DIAGNOSIS — K5900 Constipation, unspecified: Secondary | ICD-10-CM | POA: Diagnosis not present

## 2021-07-24 DIAGNOSIS — R109 Unspecified abdominal pain: Secondary | ICD-10-CM | POA: Diagnosis not present

## 2021-07-24 DIAGNOSIS — R269 Unspecified abnormalities of gait and mobility: Secondary | ICD-10-CM | POA: Diagnosis not present

## 2021-07-24 DIAGNOSIS — R930 Abnormal findings on diagnostic imaging of skull and head, not elsewhere classified: Secondary | ICD-10-CM | POA: Diagnosis not present

## 2021-07-24 DIAGNOSIS — R404 Transient alteration of awareness: Secondary | ICD-10-CM | POA: Diagnosis not present

## 2021-07-24 DIAGNOSIS — R42 Dizziness and giddiness: Secondary | ICD-10-CM | POA: Diagnosis not present

## 2021-07-24 DIAGNOSIS — Z743 Need for continuous supervision: Secondary | ICD-10-CM | POA: Diagnosis not present

## 2021-07-25 DIAGNOSIS — R3915 Urgency of urination: Secondary | ICD-10-CM | POA: Diagnosis not present

## 2021-07-25 DIAGNOSIS — E1142 Type 2 diabetes mellitus with diabetic polyneuropathy: Secondary | ICD-10-CM | POA: Diagnosis not present

## 2021-07-25 DIAGNOSIS — R930 Abnormal findings on diagnostic imaging of skull and head, not elsewhere classified: Secondary | ICD-10-CM | POA: Diagnosis not present

## 2021-07-25 DIAGNOSIS — G9389 Other specified disorders of brain: Secondary | ICD-10-CM | POA: Diagnosis not present

## 2021-07-25 DIAGNOSIS — R9082 White matter disease, unspecified: Secondary | ICD-10-CM | POA: Diagnosis not present

## 2021-07-25 DIAGNOSIS — G319 Degenerative disease of nervous system, unspecified: Secondary | ICD-10-CM | POA: Diagnosis not present

## 2021-07-25 DIAGNOSIS — R269 Unspecified abnormalities of gait and mobility: Secondary | ICD-10-CM | POA: Diagnosis not present

## 2021-07-26 DIAGNOSIS — R3915 Urgency of urination: Secondary | ICD-10-CM | POA: Diagnosis not present

## 2021-07-26 DIAGNOSIS — R269 Unspecified abnormalities of gait and mobility: Secondary | ICD-10-CM | POA: Diagnosis not present

## 2021-07-26 DIAGNOSIS — R930 Abnormal findings on diagnostic imaging of skull and head, not elsewhere classified: Secondary | ICD-10-CM | POA: Diagnosis not present

## 2021-07-26 DIAGNOSIS — E1142 Type 2 diabetes mellitus with diabetic polyneuropathy: Secondary | ICD-10-CM | POA: Diagnosis not present

## 2021-07-26 DIAGNOSIS — G9389 Other specified disorders of brain: Secondary | ICD-10-CM | POA: Diagnosis not present

## 2021-07-27 DIAGNOSIS — R930 Abnormal findings on diagnostic imaging of skull and head, not elsewhere classified: Secondary | ICD-10-CM | POA: Diagnosis not present

## 2021-07-27 DIAGNOSIS — E1142 Type 2 diabetes mellitus with diabetic polyneuropathy: Secondary | ICD-10-CM | POA: Diagnosis not present

## 2021-07-27 DIAGNOSIS — R3915 Urgency of urination: Secondary | ICD-10-CM | POA: Diagnosis not present

## 2021-07-27 DIAGNOSIS — R269 Unspecified abnormalities of gait and mobility: Secondary | ICD-10-CM | POA: Diagnosis not present

## 2021-07-27 DIAGNOSIS — G9389 Other specified disorders of brain: Secondary | ICD-10-CM | POA: Diagnosis not present

## 2021-08-03 DIAGNOSIS — L570 Actinic keratosis: Secondary | ICD-10-CM | POA: Diagnosis not present

## 2021-08-03 DIAGNOSIS — L821 Other seborrheic keratosis: Secondary | ICD-10-CM | POA: Diagnosis not present

## 2021-08-03 DIAGNOSIS — Z85828 Personal history of other malignant neoplasm of skin: Secondary | ICD-10-CM | POA: Diagnosis not present

## 2021-08-03 DIAGNOSIS — L298 Other pruritus: Secondary | ICD-10-CM | POA: Diagnosis not present

## 2021-09-01 ENCOUNTER — Inpatient Hospital Stay (HOSPITAL_COMMUNITY)
Admission: EM | Admit: 2021-09-01 | Discharge: 2021-09-11 | DRG: 074 | Disposition: A | Discharge status: Skilled Nursing Facility | Payer: Medicare Other | Attending: Internal Medicine | Admitting: Internal Medicine

## 2021-09-01 ENCOUNTER — Emergency Department (HOSPITAL_COMMUNITY): Payer: Medicare Other

## 2021-09-01 ENCOUNTER — Encounter (HOSPITAL_COMMUNITY): Payer: Self-pay | Admitting: Internal Medicine

## 2021-09-01 DIAGNOSIS — Z7982 Long term (current) use of aspirin: Secondary | ICD-10-CM

## 2021-09-01 DIAGNOSIS — R9082 White matter disease, unspecified: Secondary | ICD-10-CM | POA: Diagnosis not present

## 2021-09-01 DIAGNOSIS — I10 Essential (primary) hypertension: Secondary | ICD-10-CM | POA: Diagnosis present

## 2021-09-01 DIAGNOSIS — F419 Anxiety disorder, unspecified: Secondary | ICD-10-CM | POA: Diagnosis present

## 2021-09-01 DIAGNOSIS — I129 Hypertensive chronic kidney disease with stage 1 through stage 4 chronic kidney disease, or unspecified chronic kidney disease: Secondary | ICD-10-CM | POA: Diagnosis present

## 2021-09-01 DIAGNOSIS — M109 Gout, unspecified: Secondary | ICD-10-CM | POA: Diagnosis present

## 2021-09-01 DIAGNOSIS — N183 Chronic kidney disease, stage 3 unspecified: Secondary | ICD-10-CM | POA: Diagnosis present

## 2021-09-01 DIAGNOSIS — W19XXXA Unspecified fall, initial encounter: Secondary | ICD-10-CM | POA: Diagnosis not present

## 2021-09-01 DIAGNOSIS — Z8546 Personal history of malignant neoplasm of prostate: Secondary | ICD-10-CM

## 2021-09-01 DIAGNOSIS — J811 Chronic pulmonary edema: Secondary | ICD-10-CM | POA: Diagnosis not present

## 2021-09-01 DIAGNOSIS — Z8249 Family history of ischemic heart disease and other diseases of the circulatory system: Secondary | ICD-10-CM

## 2021-09-01 DIAGNOSIS — I672 Cerebral atherosclerosis: Secondary | ICD-10-CM | POA: Diagnosis not present

## 2021-09-01 DIAGNOSIS — D509 Iron deficiency anemia, unspecified: Secondary | ICD-10-CM | POA: Diagnosis present

## 2021-09-01 DIAGNOSIS — E785 Hyperlipidemia, unspecified: Secondary | ICD-10-CM | POA: Diagnosis present

## 2021-09-01 DIAGNOSIS — H3530 Unspecified macular degeneration: Secondary | ICD-10-CM | POA: Diagnosis present

## 2021-09-01 DIAGNOSIS — Z515 Encounter for palliative care: Secondary | ICD-10-CM

## 2021-09-01 DIAGNOSIS — N1831 Chronic kidney disease, stage 3a: Secondary | ICD-10-CM | POA: Diagnosis present

## 2021-09-01 DIAGNOSIS — K219 Gastro-esophageal reflux disease without esophagitis: Secondary | ICD-10-CM | POA: Diagnosis present

## 2021-09-01 DIAGNOSIS — Z91148 Patient's other noncompliance with medication regimen for other reason: Secondary | ICD-10-CM

## 2021-09-01 DIAGNOSIS — I6523 Occlusion and stenosis of bilateral carotid arteries: Secondary | ICD-10-CM | POA: Diagnosis not present

## 2021-09-01 DIAGNOSIS — E1140 Type 2 diabetes mellitus with diabetic neuropathy, unspecified: Principal | ICD-10-CM | POA: Diagnosis present

## 2021-09-01 DIAGNOSIS — G4700 Insomnia, unspecified: Secondary | ICD-10-CM | POA: Diagnosis present

## 2021-09-01 DIAGNOSIS — F0394 Unspecified dementia, unspecified severity, with anxiety: Secondary | ICD-10-CM | POA: Diagnosis present

## 2021-09-01 DIAGNOSIS — E669 Obesity, unspecified: Secondary | ICD-10-CM | POA: Diagnosis present

## 2021-09-01 DIAGNOSIS — N189 Chronic kidney disease, unspecified: Secondary | ICD-10-CM

## 2021-09-01 DIAGNOSIS — R55 Syncope and collapse: Secondary | ICD-10-CM | POA: Diagnosis not present

## 2021-09-01 DIAGNOSIS — D751 Secondary polycythemia: Secondary | ICD-10-CM | POA: Diagnosis present

## 2021-09-01 DIAGNOSIS — Z6826 Body mass index (BMI) 26.0-26.9, adult: Secondary | ICD-10-CM

## 2021-09-01 DIAGNOSIS — F32A Depression, unspecified: Secondary | ICD-10-CM | POA: Diagnosis present

## 2021-09-01 DIAGNOSIS — G904 Autonomic dysreflexia: Secondary | ICD-10-CM | POA: Diagnosis present

## 2021-09-01 DIAGNOSIS — Z8612 Personal history of poliomyelitis: Secondary | ICD-10-CM | POA: Diagnosis present

## 2021-09-01 DIAGNOSIS — R2681 Unsteadiness on feet: Secondary | ICD-10-CM

## 2021-09-01 DIAGNOSIS — R42 Dizziness and giddiness: Secondary | ICD-10-CM

## 2021-09-01 DIAGNOSIS — E1121 Type 2 diabetes mellitus with diabetic nephropathy: Secondary | ICD-10-CM | POA: Diagnosis present

## 2021-09-01 DIAGNOSIS — G14 Postpolio syndrome: Secondary | ICD-10-CM | POA: Diagnosis present

## 2021-09-01 DIAGNOSIS — R296 Repeated falls: Secondary | ICD-10-CM | POA: Diagnosis present

## 2021-09-01 DIAGNOSIS — Z888 Allergy status to other drugs, medicaments and biological substances status: Secondary | ICD-10-CM

## 2021-09-01 DIAGNOSIS — E1165 Type 2 diabetes mellitus with hyperglycemia: Secondary | ICD-10-CM | POA: Diagnosis present

## 2021-09-01 DIAGNOSIS — G319 Degenerative disease of nervous system, unspecified: Secondary | ICD-10-CM | POA: Diagnosis not present

## 2021-09-01 DIAGNOSIS — Z79899 Other long term (current) drug therapy: Secondary | ICD-10-CM

## 2021-09-01 LAB — CBC WITH DIFFERENTIAL/PLATELET
Abs Immature Granulocytes: 0.05 10*3/uL (ref 0.00–0.07)
Basophils Absolute: 0.1 10*3/uL (ref 0.0–0.1)
Basophils Relative: 1 %
Eosinophils Absolute: 0.2 10*3/uL (ref 0.0–0.5)
Eosinophils Relative: 2 %
HCT: 53.6 % — ABNORMAL HIGH (ref 39.0–52.0)
Hemoglobin: 18.1 g/dL — ABNORMAL HIGH (ref 13.0–17.0)
Immature Granulocytes: 1 %
Lymphocytes Relative: 20 %
Lymphs Abs: 1.7 10*3/uL (ref 0.7–4.0)
MCH: 29.7 pg (ref 26.0–34.0)
MCHC: 33.8 g/dL (ref 30.0–36.0)
MCV: 88 fL (ref 80.0–100.0)
Monocytes Absolute: 0.8 10*3/uL (ref 0.1–1.0)
Monocytes Relative: 9 %
Neutro Abs: 5.6 10*3/uL (ref 1.7–7.7)
Neutrophils Relative %: 67 %
Platelets: 235 10*3/uL (ref 150–400)
RBC: 6.09 MIL/uL — ABNORMAL HIGH (ref 4.22–5.81)
RDW: 12.7 % (ref 11.5–15.5)
WBC: 8.4 10*3/uL (ref 4.0–10.5)
nRBC: 0 % (ref 0.0–0.2)

## 2021-09-01 LAB — COMPREHENSIVE METABOLIC PANEL
ALT: 23 U/L (ref 0–44)
AST: 18 U/L (ref 15–41)
Albumin: 4 g/dL (ref 3.5–5.0)
Alkaline Phosphatase: 114 U/L (ref 38–126)
Anion gap: 16 — ABNORMAL HIGH (ref 5–15)
BUN: 28 mg/dL — ABNORMAL HIGH (ref 8–23)
CO2: 23 mmol/L (ref 22–32)
Calcium: 9.6 mg/dL (ref 8.9–10.3)
Chloride: 98 mmol/L (ref 98–111)
Creatinine, Ser: 1.84 mg/dL — ABNORMAL HIGH (ref 0.61–1.24)
GFR, Estimated: 40 mL/min — ABNORMAL LOW (ref >60)
Glucose, Bld: 246 mg/dL — ABNORMAL HIGH (ref 70–99)
Potassium: 4 mmol/L (ref 3.5–5.1)
Sodium: 137 mmol/L (ref 135–145)
Total Bilirubin: 0.8 mg/dL (ref 0.3–1.2)
Total Protein: 7.4 g/dL (ref 6.5–8.1)

## 2021-09-01 LAB — TROPONIN I (HIGH SENSITIVITY)
Troponin I (High Sensitivity): 5 ng/L (ref <18)
Troponin I (High Sensitivity): 7 ng/L (ref <18)

## 2021-09-01 MED ORDER — MECLIZINE HCL 25 MG PO TABS
12.5000 mg | ORAL_TABLET | Freq: Once | ORAL | Status: AC
  Administered 2021-09-01: 12.5 mg via ORAL
  Filled 2021-09-01: qty 1

## 2021-09-01 MED ORDER — THIAMINE HCL 100 MG/ML IJ SOLN: 100.0000 mg | Freq: Once | INTRAMUSCULAR | Status: DC

## 2021-09-01 MED ORDER — LACTATED RINGERS IV BOLUS
1000.0000 mL | Freq: Once | INTRAVENOUS | Status: AC
  Administered 2021-09-01: 1000 mL via INTRAVENOUS

## 2021-09-01 NOTE — ED Triage Notes (Signed)
Pt c/o dizziness x 4-5 days. Advises he was seen in [place] for same, states he has "spaces" in his brain." States he feels "close to blacking out," & walks w a stagger. Denies N/V

## 2021-09-01 NOTE — Discharge Instructions (Signed)
Your lab work and MRI today did not show evidence of a stroke or other issues. You need to establish care with a primary doctor and see a neurologist for these symptoms. If you develop any worsening of your symptoms, please return to the emergency department. We hope you feel better soon.

## 2021-09-01 NOTE — ED Provider Notes (Signed)
[facility] EMERGENCY DEPARTMENT Provider Note   CSN: [id number] Arrival date & time: 09/01/21  1609     History  Chief Complaint  Patient presents with   Dizziness    [name] is a 66 y.o. male.  66 year old male with a past medical history of hyperlipidemia, hypertension, CKD presents to the ED with 5 days of intermittent dizziness described as lightheadedness.  He reports "feeling like I am drunk", though denies daily alcohol use.  Patient states that he has intermittently passed out from these episodes and thinks that he may have hit his head several days ago.  He states that he has had similar issues in the past as well as a hospitalization for this at [facility] in May 2023.  At that time, he was told that he had "spaces in his brain" but has not followed up for this or taken any medications.  He returns to the ED today due to difficulty ambulating as well as severe headache.  He also states that he has had some diffuse nonradiating chest pain without SOB. Denies fevers, nausea, or vomiting.  The history is provided by the patient and medical records.       Home Medications Prior to Admission medications   Medication Sig Start Date End Date Taking? Authorizing Provider  acetaminophen (TYLENOL) 325 MG tablet Take 2 tablets (650 mg total) by mouth every 6 (six) hours as needed for mild pain (or Fever >/= 101). 02/12/20   Domenic Polite, MD  alfuzosin (UROXATRAL) 10 MG 24 hr tablet Take 1 tablet (10 mg total) by mouth daily with breakfast. 03/30/21   Ghimire, Henreitta Leber, MD  amLODipine (NORVASC) 5 MG tablet Take 1 tablet (5 mg total) by mouth daily. 01/07/21   Regalado, Belkys A, MD  ARIPiprazole (ABILIFY) 2 MG tablet Take 1 tablet (2 mg total) by mouth daily. 03/30/21   Ghimire, Henreitta Leber, MD  aspirin EC 81 MG tablet Take 81 mg by mouth daily with breakfast.     [provider]  atorvastatin (LIPITOR) 40 MG tablet Take 1 tablet (40 mg total) by mouth  daily. 03/30/21   Ghimire, Henreitta Leber, MD  dicyclomine (BENTYL) 20 MG tablet Take 1 tablet (20 mg total) by mouth 2 (two) times daily. 04/09/21   Deno Etienne, DO  finasteride (PROSCAR) 5 MG tablet Take 1 tablet (5 mg total) by mouth daily. 03/30/21   Ghimire, Henreitta Leber, MD  Multiple Vitamin (MULTIVITAMIN WITH MINERALS) TABS tablet Take 1 tablet by mouth daily.    [provider]  ondansetron (ZOFRAN-ODT) 4 MG disintegrating tablet '4mg'$  ODT q4 hours prn nausea/vomit 04/09/21   Deno Etienne, DO  Semaglutide (RYBELSUS) 14 MG TABS Take 1 tablet by mouth daily. Take 30 minutes before breakfast 12/06/20   [name], [name]  sildenafil (REVATIO) 20 MG tablet Take 1 tablet (20 mg total) by mouth daily as needed. 03/30/21   Ghimire, Henreitta Leber, MD  Tetrahydrozoline HCl (VISINE OP) Place 1 drop into both eyes daily as needed (itching/irritation).    [provider]  traZODone (DESYREL) 50 MG tablet Take 1 tablet (50 mg total) by mouth at bedtime as needed for sleep. 03/30/21   Ghimire, Henreitta Leber, MD      Allergies    Haldol [haloperidol]    Review of Systems   Review of Systems  Constitutional:  Negative for fever.  Respiratory:  Negative for shortness of breath.   Cardiovascular:  Positive for chest pain.  Gastrointestinal:  Negative for nausea and vomiting.  Neurological:  Positive for dizziness, syncope, light-headedness and headaches.    Physical Exam Updated Vital Signs BP 137/90   Pulse (!) 56   Temp 98.7 F (37.1 C) (Oral)   Resp 17   SpO2 93%  Physical Exam Vitals and nursing note reviewed.  Constitutional:      General: He is not in acute distress.    Appearance: He is well-developed. He is obese. He is ill-appearing (chronically).  HENT:     Head: Normocephalic.  Eyes:     Conjunctiva/sclera: Conjunctivae normal.     Pupils: Pupils are equal, round, and reactive to light.     Visual Fields:     Right eye: ABS in the lower temporal quadrant. ABS in the lower nasal  quadrant.     Comments: Pupils 30m bilaterally, no APD. No obvious nystagmus. No abnormalities on HINTS testing, though patient is poorly cooperative with exam. Double counting fingers to the periphery OS and superior temporal/nasal aspects OD.  Cardiovascular:     Rate and Rhythm: Normal rate and regular rhythm.     Heart sounds: No murmur heard. Pulmonary:     Effort: Pulmonary effort is normal. No respiratory distress.     Breath sounds: Normal breath sounds.  Abdominal:     Palpations: Abdomen is soft.     Tenderness: There is no abdominal tenderness.  Musculoskeletal:        General: No swelling.  Skin:    General: Skin is warm and dry.  Neurological:     Mental Status: He is alert.     Cranial Nerves: No dysarthria or facial asymmetry.     Motor: Tremor present.     Coordination: Finger-Nose-Finger Test abnormal.     Gait: Gait abnormal.     Comments: No overt dysarthria.  Cranial nerves III-XII are intact.  He does endorse subjective diminished light touch sensation on the left V1-V3 distribution.  Strength is 5/5 to the proximal and distal bilateral upper and lower extremities.  He has extensive past-pointing on finger-to-nose in both the periphery and central vision.  He also is noted to have a tremor present throughout finger-to-nose.  On ambulation, he has a wide-based and shuffling gait.  He intermittently will lose his balance, requiring assistance from staff members.     ED Results / Procedures / Treatments   Labs (all labs ordered are listed, but only abnormal results are displayed) Labs Reviewed  CBC WITH DIFFERENTIAL/PLATELET - Abnormal; Notable for the following components:      Result Value   RBC 6.09 (*)    Hemoglobin 18.1 (*)    HCT 53.6 (*)    All other components within normal limits  COMPREHENSIVE METABOLIC PANEL - Abnormal; Notable for the following components:   Glucose, Bld 246 (*)    BUN 28 (*)    Creatinine, Ser 1.84 (*)    GFR, Estimated 40 (*)     Anion gap 16 (*)    All other components within normal limits  VITAMIN B12  FOLATE  RAPID URINE DRUG SCREEN, HOSP PERFORMED  URINALYSIS, ROUTINE W REFLEX MICROSCOPIC  RPR  CBG MONITORING, ED  TROPONIN I (HIGH SENSITIVITY)  TROPONIN I (HIGH SENSITIVITY)    EKG EKG Interpretation  Date/Time:  Saturday September 01 2021 18:43:59 EDT Ventricular Rate:  70 PR Interval:  156 QRS Duration: 80 QT Interval:  356 QTC Calculation: 385 R Axis:   -2 Text Interpretation: Sinus rhythm Low voltage, precordial leads  Consider anterior infarct No significant change was found Confirmed by Ezequiel Essex [phone]) on 09/01/2021 6:47:34 PM  Radiology MR BRAIN WO CONTRAST  Result Date: 09/01/2021 CLINICAL DATA:  Initial evaluation for acute dizziness. EXAM: MRI HEAD WITHOUT CONTRAST TECHNIQUE: Multiplanar, multiecho pulse sequences of the brain and surrounding structures were obtained without intravenous contrast. COMPARISON:  Prior CT from earlier the same day. FINDINGS: Brain: Generalized age-related cerebral atrophy. Patchy and confluent T2/FLAIR hyperintensity involving the periventricular deep white matter both cerebral hemispheres, most consistent with chronic small vessel ischemic disease, moderately advanced in nature. No evidence for acute or subacute infarct. Gray-white matter differentiation maintained. No areas of chronic cortical infarction. No acute or chronic intracranial blood products. No mass lesion, midline shift or mass effect. Mild ventricular prominence related global parenchymal volume loss of hydrocephalus. No extra-axial fluid collection. Pituitary gland and suprasellar region normal. Vascular: Major intracranial vascular flow voids are maintained. Skull and upper cervical spine: Craniocervical junction normal. Bone marrow signal intensity within normal limits. No scalp soft tissue abnormality 5 Sinuses/Orbits: Globes and orbital soft tissues within normal limits. Paranasal sinuses are largely  clear. No mastoid effusion. Other: None. IMPRESSION: 1. No acute intracranial abnormality. 2. Moderately advanced chronic microvascular ischemic disease for age. Electronically Signed   By: Jeannine Boga M.D.   On: 09/01/2021 23:06   DG Chest Portable 1 View  Result Date: 09/01/2021 CLINICAL DATA:  Evaluate for pulmonary edema. EXAM: PORTABLE CHEST 1 VIEW COMPARISON:  Chest radiograph dated 05/30/2021. FINDINGS: No focal consolidation, pleural effusion, pneumothorax. The cardiac silhouette is within limits. No acute osseous pathology. IMPRESSION: No active disease. Electronically Signed   By: Anner Crete M.D.   On: 09/01/2021 19:36   CT HEAD WO CONTRAST (5MM)  Result Date: 09/01/2021 CLINICAL DATA:  Dizziness, persistent/recurrent, cardiac or vascular cause suspected. EXAM: CT HEAD WITHOUT CONTRAST TECHNIQUE: Contiguous axial images were obtained from the base of the skull through the vertex without intravenous contrast. RADIATION DOSE REDUCTION: This exam was performed according to the departmental dose-optimization program which includes automated exposure control, adjustment of the mA and/or kV according to patient size and/or use of iterative reconstruction technique. COMPARISON:  CT head without contrast 09/28/2019 FINDINGS: Brain: Moderate atrophy and white matter disease has progressed. No acute infarct, hemorrhage, or mass lesion is present. The ventricles are proportionate to the degree of atrophy. No significant extraaxial fluid collection is present. The brainstem and cerebellum are within normal limits. Vascular: Atherosclerotic calcifications are present within the cavernous internal carotid arteries. Additional calcifications are present at the dural margin of the vertebral arteries. No hyperdense vessel is present. Skull: Calvarium is intact. No focal lytic or blastic lesions are present. No significant extracranial soft tissue lesion is present. Sinuses/Orbits: The paranasal  sinuses and mastoid air cells are clear. The globes and orbits are within normal limits. IMPRESSION: 1. Progressive atrophy and white matter disease. This likely reflects the sequela of chronic microvascular ischemia. 2. No acute intracranial abnormality. Electronically Signed   By: San Morelle M.D.   On: 09/01/2021 18:10    Procedures Procedures    Medications Ordered in ED Medications  thiamine (B-1) injection 100 mg (has no administration in time range)  lactated ringers bolus 1,000 mL (1,000 mLs Intravenous New Bag/Given 09/01/21 1856)  meclizine (ANTIVERT) tablet 12.5 mg (12.5 mg Oral Given 09/01/21 1844)    ED Course/ Medical Decision Making/ A&P Clinical Course as of 09/02/21 0004  Sat Sep 01, 2021  2340 Patient re-evaluated, attempted to ambulate in  the ED. Unfortunately, patient staggered into the wall and required assistance by myself and an EMT to prevent him from falling. He continues to report feeling poorly and unstable. [JC]    Clinical Course User Index [JC] Christien Berthelot, [place], MD                           Medical Decision Making Amount and/or Complexity of Data Reviewed External Data Reviewed: labs and notes. Labs: ordered. Radiology: ordered. ECG/medicine tests: ordered.  Risk Prescription drug management. Decision regarding hospitalization.   66 year old male with a history as above presents today with recurrent dizziness and difficulty ambulating.  I performed an extensive chart review which was notable for an admission in May 2023 to [facility] for similar presentation.  At that time, he had an MRI that showed evidence of ventricular prominence without overt hydrocephalus.  It was suspected that his gait abnormalities and dizziness were secondary to diabetic neuropathy and poorly controlled hyperglycemia.  Unfortunately, he left AMA prior to completing his work-up.  He does not follow with anyone as an outpatient.  Initial emergent differential includes posterior  circulation insufficiency, space-occupying lesion, ischemic stroke, metabolic abnormalities, NPH hydrocephalus.  Patient denies a history of daily alcohol use, though Wernicke's encephalopathy is an additional consideration.   On exam, he has evidence of a wide-based shuffling gait with episodes of instability.  He also has significant past-pointing on exam, concerning for underlying cerebellar dysfunction.  Reviewed and interpreted the patient's labs which are largely reassuring.  He has no significant leukocytosis or leukopenia that support an underlying systemic or infectious process at this time.  CMP with mild hyperglycemia without evidence of metabolic acidosis.  Kidney functions at baseline.  Troponin stable at 5 -> 7, low suspicion for ACS at this time.  CT head and MRI brain were both obtained and negative for evidence of ischemic stroke or posterior circulation etiology.  Of hydrocephalus or space-occupying lesions.  He does have evidence of chronic microvascular disease, but this is nonspecific.  No intra-cranial hemorrhage.   On reevaluation following IV fluids and meclizine, patient reported continued dizziness and feelings of instability.  Attempted to ambulate the patient without success.  He was noted to be staggering and briefly fell into a wall requiring assistance from staff.  He did not fall to the floor or hit his head.  Given his gait instability with recurrent reported syncopal episodes without a clear etiology, will plan for admission for further medical management.  I spoke with the inpatient hospital team who is agreeable to accept the patient to their service.  Plan of care was discussed with the patient who verbalized understanding.  At this time, patient is stable for transfer to the floor.  Final Clinical Impression(s) / ED Diagnoses Final diagnoses:  Dizziness  Chronic kidney disease, unspecified CKD stage  Recurrent syncope  Gait instability    Rx / DC Orders ED  Discharge Orders     None         Rivers Gassmann, [place], MD 09/02/21 Lynnell Catalan    Ezequiel Essex, MD 09/02/21 1028

## 2021-09-02 ENCOUNTER — Observation Stay (HOSPITAL_COMMUNITY): Payer: Medicare Other

## 2021-09-02 ENCOUNTER — Encounter (HOSPITAL_COMMUNITY): Payer: Self-pay | Admitting: Internal Medicine

## 2021-09-02 DIAGNOSIS — F32A Depression, unspecified: Secondary | ICD-10-CM

## 2021-09-02 DIAGNOSIS — I10 Essential (primary) hypertension: Secondary | ICD-10-CM | POA: Diagnosis not present

## 2021-09-02 DIAGNOSIS — R42 Dizziness and giddiness: Secondary | ICD-10-CM

## 2021-09-02 DIAGNOSIS — E1121 Type 2 diabetes mellitus with diabetic nephropathy: Secondary | ICD-10-CM | POA: Diagnosis not present

## 2021-09-02 DIAGNOSIS — R55 Syncope and collapse: Secondary | ICD-10-CM | POA: Diagnosis not present

## 2021-09-02 DIAGNOSIS — N1831 Chronic kidney disease, stage 3a: Secondary | ICD-10-CM

## 2021-09-02 DIAGNOSIS — Z8612 Personal history of poliomyelitis: Secondary | ICD-10-CM

## 2021-09-02 LAB — URINALYSIS, ROUTINE W REFLEX MICROSCOPIC
Bacteria, UA: NONE SEEN
Bilirubin Urine: NEGATIVE
Glucose, UA: >=500 mg/dL — AB
Hgb urine dipstick: NEGATIVE
Ketones, ur: NEGATIVE mg/dL
Leukocytes,Ua: NEGATIVE
Nitrite: NEGATIVE
Protein, ur: 30 mg/dL — AB
Specific Gravity, Urine: 1.02 (ref 1.005–1.030)
pH: 5 (ref 5.0–8.0)

## 2021-09-02 LAB — COMPREHENSIVE METABOLIC PANEL
ALT: 19 U/L (ref 0–44)
AST: 15 U/L (ref 15–41)
Albumin: 3.3 g/dL — ABNORMAL LOW (ref 3.5–5.0)
Alkaline Phosphatase: 93 U/L (ref 38–126)
Anion gap: 9 (ref 5–15)
BUN: 27 mg/dL — ABNORMAL HIGH (ref 8–23)
CO2: 24 mmol/L (ref 22–32)
Calcium: 9.1 mg/dL (ref 8.9–10.3)
Chloride: 103 mmol/L (ref 98–111)
Creatinine, Ser: 1.66 mg/dL — ABNORMAL HIGH (ref 0.61–1.24)
GFR, Estimated: 45 mL/min — ABNORMAL LOW (ref >60)
Glucose, Bld: 256 mg/dL — ABNORMAL HIGH (ref 70–99)
Potassium: 3.6 mmol/L (ref 3.5–5.1)
Sodium: 136 mmol/L (ref 135–145)
Total Bilirubin: 0.8 mg/dL (ref 0.3–1.2)
Total Protein: 6.5 g/dL (ref 6.5–8.1)

## 2021-09-02 LAB — CBC
HCT: 53.6 % — ABNORMAL HIGH (ref 39.0–52.0)
Hemoglobin: 17.6 g/dL — ABNORMAL HIGH (ref 13.0–17.0)
MCH: 29.4 pg (ref 26.0–34.0)
MCHC: 32.8 g/dL (ref 30.0–36.0)
MCV: 89.5 fL (ref 80.0–100.0)
Platelets: 194 10*3/uL (ref 150–400)
RBC: 5.99 MIL/uL — ABNORMAL HIGH (ref 4.22–5.81)
RDW: 12.8 % (ref 11.5–15.5)
WBC: 7.6 10*3/uL (ref 4.0–10.5)
nRBC: 0 % (ref 0.0–0.2)

## 2021-09-02 LAB — FOLATE: Folate: 23.1 ng/mL (ref >5.9)

## 2021-09-02 LAB — RAPID URINE DRUG SCREEN, HOSP PERFORMED
Amphetamines: NOT DETECTED
Barbiturates: NOT DETECTED
Benzodiazepines: NOT DETECTED
Cocaine: NOT DETECTED
Opiates: NOT DETECTED
Tetrahydrocannabinol: NOT DETECTED

## 2021-09-02 LAB — RPR: RPR Ser Ql: NONREACTIVE

## 2021-09-02 LAB — VITAMIN B12: Vitamin B-12: 544 pg/mL (ref 180–914)

## 2021-09-02 LAB — MAGNESIUM: Magnesium: 1.9 mg/dL (ref 1.7–2.4)

## 2021-09-02 MED ORDER — ENOXAPARIN SODIUM 40 MG/0.4ML IJ SOSY
40.0000 mg | PREFILLED_SYRINGE | Freq: Every day | INTRAMUSCULAR | Status: DC
  Administered 2021-09-02 – 2021-09-11 (×10): 40 mg via SUBCUTANEOUS
  Filled 2021-09-02 (×10): qty 0.4

## 2021-09-02 MED ORDER — POLYETHYLENE GLYCOL 3350 17 G PO PACK: 17.0000 g | PACK | Freq: Every day | ORAL | Status: DC | PRN

## 2021-09-02 MED ORDER — ACETAMINOPHEN 650 MG RE SUPP: 650.0000 mg | Freq: Four times a day (QID) | RECTAL | Status: DC | PRN

## 2021-09-02 MED ORDER — SODIUM CHLORIDE 0.9% FLUSH
3.0000 mL | Freq: Two times a day (BID) | INTRAVENOUS | Status: DC
  Administered 2021-09-02 – 2021-09-11 (×17): 3 mL via INTRAVENOUS

## 2021-09-02 MED ORDER — FINASTERIDE 5 MG PO TABS
5.0000 mg | ORAL_TABLET | Freq: Every day | ORAL | Status: DC
Start: 2021-09-02 — End: 2021-09-11
  Administered 2021-09-02 – 2021-09-11 (×10): 5 mg via ORAL
  Filled 2021-09-02 (×10): qty 1

## 2021-09-02 MED ORDER — ATORVASTATIN CALCIUM 40 MG PO TABS
40.0000 mg | ORAL_TABLET | Freq: Every day | ORAL | Status: DC
  Administered 2021-09-02 – 2021-09-11 (×10): 40 mg via ORAL
  Filled 2021-09-02 (×10): qty 1

## 2021-09-02 MED ORDER — ACETAMINOPHEN 325 MG PO TABS
650.0000 mg | ORAL_TABLET | Freq: Four times a day (QID) | ORAL | Status: DC | PRN
  Administered 2021-09-02 – 2021-09-10 (×6): 650 mg via ORAL
  Filled 2021-09-02 (×7): qty 2

## 2021-09-02 NOTE — Evaluation (Signed)
Occupational Therapy Evaluation Patient Details Name: [name] MRN: [id number] DOB: 1955/07/15 Today's Date: 09/02/2021   History of Present Illness 66 y.o. male presents to [facility] hospital on 09/01/2021 with dizziness and recent syncopal episodes. CT and MRI unremakrable. PMH includes DMII, CKD, HLD, HTN   Clinical Impression   [name] was evaluated s/p the above admission list, he is generally indep at baseline however has had a few falls recently and now presents with a painful R knee. Upon evaluation pt had functional limitations due to dizziness, unsteady gait, 1 LOB, general weakness and insight to safety. He required min for mobility with hand hold assist, and up to min A for Adls. He will benefit from OT acutely. Recommend d/c to home with [place].    Recommendations for follow up therapy are one component of a multi-disciplinary discharge planning process, led by the attending physician.  Recommendations may be updated based on patient status, additional functional criteria and insurance authorization.   Follow Up Recommendations  Home health OT    Assistance Recommended at Discharge Frequent or constant Supervision/Assistance  Patient can return home with the following A little help with walking and/or transfers;A little help with bathing/dressing/bathroom;Assistance with cooking/housework;Assist for transportation;Help with stairs or ramp for entrance    Functional Status Assessment  Patient has had a recent decline in their functional status and demonstrates the ability to make significant improvements in function in a reasonable and predictable amount of time.  Equipment Recommendations  Tub/shower seat    Recommendations for Other Services       Precautions / Restrictions Precautions Precautions: Fall Restrictions Weight Bearing Restrictions: No      Mobility Bed Mobility Overal bed mobility: Needs Assistance Bed Mobility: Supine to Sit     Supine to sit: Min assist           Transfers Overall transfer level: Needs assistance Equipment used: 1 person hand held assist Transfers: Sit to/from Stand Sit to Stand: Min guard                  Balance Overall balance assessment: Needs assistance Sitting-balance support: No upper extremity supported, Feet unsupported Sitting balance-Leahy Scale: Fair     Standing balance support: Single extremity supported, During functional activity Standing balance-Leahy Scale: Poor Standing balance comment: 1 LOB needed assist to correct                           ADL either performed or assessed with clinical judgement   ADL Overall ADL's : Needs assistance/impaired Eating/Feeding: Independent;Sitting   Grooming: Set up;Sitting   Upper Body Bathing: Set up;Sitting   Lower Body Bathing: Minimal assistance;Sit to/from stand   Upper Body Dressing : Set up;Sitting   Lower Body Dressing: Minimal assistance;Sit to/from stand   Toilet Transfer: Minimal assistance;Ambulation   Toileting- Clothing Manipulation and Hygiene: Supervision/safety;Sitting/lateral lean       Functional mobility during ADLs: Minimal assistance General ADL Comments: pt had minor LOB with short ambulation, generally weak. Reported mild dizziness after short ambulation     Vision Baseline Vision/History: 0 No visual deficits Vision Assessment?: No apparent visual deficits     Perception     Praxis      Pertinent Vitals/Pain Pain Assessment Pain Assessment: Faces Faces Pain Scale: Hurts a little bit Pain Location: generalized with movement Pain Descriptors / Indicators: Discomfort, Grimacing, Guarding Pain Intervention(s): Limited activity within patient's tolerance, Monitored during session     Hand  Dominance Right   Extremity/Trunk Assessment Upper Extremity Assessment Upper Extremity Assessment: Overall WFL for tasks assessed   Lower Extremity Assessment Lower Extremity Assessment: Defer to PT  evaluation   Cervical / Trunk Assessment Cervical / Trunk Assessment: Kyphotic   Communication Communication Communication: No difficulties   Cognition Arousal/Alertness: Awake/alert Behavior During Therapy: Flat affect Overall Cognitive Status: No family/caregiver present to determine baseline cognitive functioning                                 General Comments: overall WFL     General Comments  VSS on RA    Exercises     Shoulder Instructions      Home Living Family/patient expects to be discharged to:: Private residence Living Arrangements: Alone Available Help at Discharge: Family;Available PRN/intermittently Type of Home: Apartment Home Access: Stairs to enter Entrance Stairs-Number of Steps: 2 steps Entrance Stairs-Rails: Right Home Layout: One level     Bathroom Shower/Tub: Teacher, early years/pre: Standard Bathroom Accessibility: Yes   Home Equipment: Shower seat          Prior Functioning/Environment Prior Level of Function : Independent/Modified Independent             Mobility Comments: indep, no DME ADLs Comments: Indep, friend takes him to appts and groceries        OT Problem List: Decreased strength;Decreased activity tolerance;Decreased range of motion;Impaired balance (sitting and/or standing);Decreased safety awareness;Decreased knowledge of precautions      OT Treatment/Interventions: Self-care/ADL training;Therapeutic exercise;DME and/or AE instruction;Balance training;Patient/family education;Therapeutic activities    OT Goals(Current goals can be found in the care plan section) Acute Rehab OT Goals Patient Stated Goal: home OT Goal Formulation: With patient Time For Goal Achievement: 09/16/21 Potential to Achieve Goals: Good ADL Goals Pt Will Perform Grooming: Independently;standing Pt Will Perform Lower Body Bathing: Independently;sit to/from stand Pt Will Perform Lower Body Dressing:  Independently;sit to/from stand Pt Will Transfer to Toilet: Independently;ambulating  OT Frequency: Min 2X/week    Co-evaluation              AM-PAC OT "6 Clicks" Daily Activity     Outcome Measure Help from another person eating meals?: None Help from another person taking care of personal grooming?: A Little Help from another person toileting, which includes using toliet, bedpan, or urinal?: A Little Help from another person bathing (including washing, rinsing, drying)?: A Little Help from another person to put on and taking off regular upper body clothing?: None Help from another person to put on and taking off regular lower body clothing?: A Little 6 Click Score: 20   End of Session Equipment Utilized During Treatment: Gait belt Nurse Communication: Mobility status  Activity Tolerance: Patient tolerated treatment well Patient left: in bed;with call bell/phone within reach  OT Visit Diagnosis: Unsteadiness on feet (R26.81);Other abnormalities of gait and mobility (R26.89);Muscle weakness (generalized) (M62.81)                Time: 3785-8850 OT Time Calculation (min): 17 min Charges:  OT General Charges $OT Visit: 1 Visit OT Evaluation $OT Eval Moderate Complexity: 1 Mod   Shonda Mandarino A Alice Vitelli 09/02/2021, 10:32 AM

## 2021-09-02 NOTE — Evaluation (Signed)
Physical Therapy Evaluation Patient Details Name: [name] MRN: [id number] DOB: 06/07/1955 Today's Date: 09/02/2021  History of Present Illness  66 y.o. male presents to [facility] hospital on 09/01/2021 with dizziness and recent syncopal episodes. CT and MRI unremakrable. PMH includes DMII, CKD, HLD, HTN.  Clinical Impression  Pt presents to PT with deficits in activity tolerance, balance, gait, endurance. Pt reports a recent history of lightheadedness and periods of blurred vision. Pt denies dizziness and spinning sensation. Pt reports being under a lot of stress recently and refuses further vestibular assessment other than what is documented below. Pt is able to mobilize for short household distances without physical assistance at this time. Pt will benefit from continued acute PT services in an effort to better assess mobility.       Recommendations for follow up therapy are one component of a multi-disciplinary discharge planning process, led by the attending physician.  Recommendations may be updated based on patient status, additional functional criteria and insurance authorization.  Follow Up Recommendations Home health PT    Assistance Recommended at Discharge Intermittent Supervision/Assistance  Patient can return home with the following  A little help with bathing/dressing/bathroom;Assistance with cooking/housework;Direct supervision/assist for medications management;Direct supervision/assist for financial management;Assist for transportation;Help with stairs or ramp for entrance    Equipment Recommendations None recommended by PT  Recommendations for Other Services       Functional Status Assessment Patient has had a recent decline in their functional status and demonstrates the ability to make significant improvements in function in a reasonable and predictable amount of time.     Precautions / Restrictions Precautions Precautions: Fall Restrictions Weight Bearing Restrictions:  No      Mobility  Bed Mobility Overal bed mobility: Needs Assistance Bed Mobility: Supine to Sit, Sit to Supine     Supine to sit: Supervision Sit to supine: Supervision        Transfers Overall transfer level: Needs assistance Equipment used: None Transfers: Sit to/from Stand Sit to Stand: Supervision                Ambulation/Gait Ambulation/Gait assistance: Supervision Gait Distance (Feet): 20 Feet Assistive device: None Gait Pattern/deviations: Step-through pattern, Decreased stance time - right Gait velocity: reduced Gait velocity interpretation: <1.31 ft/sec, indicative of household ambulator   General Gait Details: pt with slowed step-through gait, reduced stance time on RLE, pt declines further mobility due to feeling unwell and stressed  Stairs            Wheelchair Mobility    Modified Rankin (Stroke Patients Only)       Balance Overall balance assessment: Needs assistance Sitting-balance support: No upper extremity supported, Feet supported Sitting balance-Leahy Scale: Good     Standing balance support: No upper extremity supported, During functional activity Standing balance-Leahy Scale: Fair                               Pertinent Vitals/Pain Pain Assessment Pain Assessment: Faces Faces Pain Scale: Hurts a little bit Pain Location: generalized with movement Pain Descriptors / Indicators: Discomfort, Grimacing Pain Intervention(s): Monitored during session    Home Living Family/patient expects to be discharged to:: Private residence Living Arrangements: Alone Available Help at Discharge: Available PRN/intermittently;Friend(s) Type of Home: Apartment Home Access: Stairs to enter Entrance Stairs-Rails: None Entrance Stairs-Number of Steps: 2   Home Layout: One level Home Equipment: Advice worker (2 wheels)      Prior  Function Prior Level of Function : Independent/Modified Independent              Mobility Comments: independent without device, does report multiple recent falls ADLs Comments: Indep, friend takes him to appts and groceries     Hand Dominance   Dominant Hand: Right    Extremity/Trunk Assessment   Upper Extremity Assessment Upper Extremity Assessment: Overall WFL for tasks assessed    Lower Extremity Assessment Lower Extremity Assessment: Generalized weakness    Cervical / Trunk Assessment Cervical / Trunk Assessment: Kyphotic  Communication   Communication: No difficulties  Cognition Arousal/Alertness: Awake/alert Behavior During Therapy: Flat affect Overall Cognitive Status: No family/caregiver present to determine baseline cognitive functioning                                 General Comments: slowed processing        General Comments General comments (skin integrity, edema, etc.): pt off monitor on PT arrival, reports feeling unwell, denies dizziness. PT attempts vestibular assessment, pt with slowed pursuits and saccades, unable to perform horizontal VOR, declines further evaluation    Exercises     Assessment/Plan    PT Assessment Patient needs continued PT services  PT Problem List Decreased activity tolerance;Decreased balance;Decreased mobility       PT Treatment Interventions DME instruction;Gait training;Stair training;Therapeutic activities;Therapeutic exercise;Neuromuscular re-education;Balance training;Patient/family education    PT Goals (Current goals can be found in the Care Plan section)  Acute Rehab PT Goals Patient Stated Goal: to reduce feelings of lightheadedness and reduce falls risk PT Goal Formulation: With patient Time For Goal Achievement: 09/16/21 Potential to Achieve Goals: Fair Additional Goals Additional Goal #1: Pt will score >19/24 on the DGI to indicate a reduced risk for falls    Frequency Min 3X/week     Co-evaluation               AM-PAC PT "6 Clicks" Mobility  Outcome  Measure Help needed turning from your back to your side while in a flat bed without using bedrails?: A Little Help needed moving from lying on your back to sitting on the side of a flat bed without using bedrails?: A Little Help needed moving to and from a bed to a chair (including a wheelchair)?: A Little Help needed standing up from a chair using your arms (e.g., wheelchair or bedside chair)?: A Little Help needed to walk in hospital room?: A Little Help needed climbing 3-5 steps with a railing? : A Little 6 Click Score: 18    End of Session   Activity Tolerance: Patient limited by fatigue Patient left: in bed;with call bell/phone within reach Nurse Communication: Mobility status PT Visit Diagnosis: Other abnormalities of gait and mobility (R26.89);Dizziness and giddiness (R42)    Time: 4967-5916 PT Time Calculation (min) (ACUTE ONLY): 12 min   Charges:   PT Evaluation $PT Eval Low Complexity: 1 Low          Zenaida Niece, PT, DPT Acute Rehabilitation Office [phone]   Zenaida Niece 09/02/2021, 2:57 PM

## 2021-09-02 NOTE — Progress Notes (Signed)
[name] is a 66 y.o. male with medical history significant of anemia, history of postpolio syndrome, hypertension, depression, anxiety, insomnia, diabetes, CKD 3, hyperlipidemia, prostate cancer, GERD, macular degeneration presenting with dizziness. .  CT head showed no acute abnormality but demonstrate progressive atrophy and white matter disease consistent with chronic microvascular disease.  MRI of the brain showed no acute abnormality,.  He was admitted for evaluation of syncope and dizziness.    Plan:  Echocardiogram.  Orthostatic vital signs,  Vestibular evaluation by PT.  Follow up RPR and vit b12  levels.  Continue to monitor.    Hosie Poisson MD

## 2021-09-02 NOTE — H&P (Signed)
History and Physical   MAUDE GLOOR POE:[id number] DOB: 1955-08-25 DOA: 09/01/2021  PCP: Pcp, No   Patient coming from: Home  Chief Complaint: Dizziness  HPI: [name] is a 66 y.o. male with medical history significant of anemia, history of postpolio syndrome, hypertension, depression, anxiety, insomnia, diabetes, CKD 3, hyperlipidemia, prostate cancer, GERD, macular degeneration presenting with dizziness.  Patient reports ongoing dizziness for the past 4 to 5 days.  States at times it feels like he could blackout and indeed reports that at times he has syncopized and thinks he may have even hit his head.  He states he staggers whenever he walks and it feels like he is drunk but has not had anything to drink.  States he had similar symptoms and was admitted to [facility] in May and was told he "had spaces in his brain "has not had any follow-up.  Chart review showed patient was admitted to [facility] with similar symptoms thought to be possibly related to neuropathy and poor diabetic control however he left AMA prior to completion of work-up.  He denies fevers, chills, chest pain, shortness of breath, abdominal pain, constipation, diarrhea, nausea, vomiting.  ED Course: Vital signs in the ED significant for heart rate in the 50s to 110s.  Lab work-up included CMP with creatinine at 1.84 near baseline of around 1.5-1.6.  Glucose 246, gap of 16.  CBC with erythrocytosis to 18.1 near baseline of 1718.  Troponin normal x2.  B12, folate, UDS, UA, RPR pending.  Chest x-ray showed no acute abnormality.  CT head showed no acute abnormality but demonstrate progressive atrophy and white matter disease consistent with chronic microvascular disease.  MRI of the brain showed no acute abnormality and did also show the chronic microvascular changes.  Patient received dose of medicines including, B1, liter fluids in the ED.  Attempted to walk him and he was very unstable and almost fell into the wall.  Review of  Systems: As per HPI otherwise all other systems reviewed and are negative.  Past Medical History:  Diagnosis Date   Acute kidney injury superimposed on chronic kidney disease ([place]) 02/10/2020   Acute on chronic kidney failure (HCC) 09/28/2019   Closed nondisplaced spiral fracture of shaft of right tibia    Community acquired pneumonia 05/16/5398   Complicated urinary tract infection 09/06/2016   Depression    Diabetes mellitus without complication (HCC)    GERD (gastroesophageal reflux disease)    Gout    right foot   Hemorrhoids    Hypertension    Hypertension    Macular degeneration    Microcytic anemia    PNA (pneumonia) 03/26/2021   Prostate cancer ([place]) 08/2009   Rectal bleeding 07/31/2016   Tubular adenoma     Past Surgical History:  Procedure Laterality Date   CYSTOSCOPY WITH RETROGRADE PYELOGRAM, URETEROSCOPY AND STENT PLACEMENT Right 07/31/2016   Procedure: CYSTOSCOPY WITH RIGHT  RETROGRADE PYELOGRAM, URETEROSCOPY;  Surgeon: [name], MD;  Location: [facility];  Service: Urology;  Laterality: Right;   INGUINAL HERNIA REPAIR     Left ankle joint fusion  1981   PROSTATE BIOPSY     x 2   URETERAL REIMPLANTION  07/31/2016   Procedure: URETERAL REIMPLANT, right boari flap, right psoas hitch;  Surgeon: [name], MD;  Location: [facility];  Service: Urology;;    Social History  reports that he has never smoked. He has never used smokeless tobacco. He reports that he does not currently use alcohol.  He reports that he does not use drugs.  Allergies  Allergen Reactions   Haldol [Haloperidol]     Per family cause life threatening complications to patient    Family History  Problem Relation Age of Onset   Lung cancer Mother        Deceased   Pancreatic cancer Father    Heart disease Father        Deceased   Throat cancer Brother    Lung cancer Maternal Uncle        nephew   Diabetes Other   Reviewed on admission  Prior to Admission medications    Medication Sig Start Date End Date Taking? Authorizing Provider  acetaminophen (TYLENOL) 325 MG tablet Take 2 tablets (650 mg total) by mouth every 6 (six) hours as needed for mild pain (or Fever >/= 101). 02/12/20   Domenic Polite, MD  alfuzosin (UROXATRAL) 10 MG 24 hr tablet Take 1 tablet (10 mg total) by mouth daily with breakfast. 03/30/21   Ghimire, Henreitta Leber, MD  amLODipine (NORVASC) 5 MG tablet Take 1 tablet (5 mg total) by mouth daily. 01/07/21   Regalado, Belkys A, MD  ARIPiprazole (ABILIFY) 2 MG tablet Take 1 tablet (2 mg total) by mouth daily. 03/30/21   Ghimire, Henreitta Leber, MD  aspirin EC 81 MG tablet Take 81 mg by mouth daily with breakfast.     [provider]  atorvastatin (LIPITOR) 40 MG tablet Take 1 tablet (40 mg total) by mouth daily. 03/30/21   Ghimire, Henreitta Leber, MD  dicyclomine (BENTYL) 20 MG tablet Take 1 tablet (20 mg total) by mouth 2 (two) times daily. 04/09/21   Deno Etienne, DO  finasteride (PROSCAR) 5 MG tablet Take 1 tablet (5 mg total) by mouth daily. 03/30/21   Ghimire, Henreitta Leber, MD  Multiple Vitamin (MULTIVITAMIN WITH MINERALS) TABS tablet Take 1 tablet by mouth daily.    [provider]  ondansetron (ZOFRAN-ODT) 4 MG disintegrating tablet '4mg'$  ODT q4 hours prn nausea/vomit 04/09/21   Deno Etienne, DO  Semaglutide (RYBELSUS) 14 MG TABS Take 1 tablet by mouth daily. Take 30 minutes before breakfast 12/06/20   [name], [name]  sildenafil (REVATIO) 20 MG tablet Take 1 tablet (20 mg total) by mouth daily as needed. 03/30/21   Ghimire, Henreitta Leber, MD  Tetrahydrozoline HCl (VISINE OP) Place 1 drop into both eyes daily as needed (itching/irritation).    [provider]  traZODone (DESYREL) 50 MG tablet Take 1 tablet (50 mg total) by mouth at bedtime as needed for sleep. 03/30/21   Jonetta Osgood, MD    Physical Exam: Vitals:   09/01/21 1638 09/01/21 1854 09/01/21 1900  BP: (!) 138/98 (!) 147/98 137/90  Pulse: (!) 111 61 (!) 56  Resp: '18 16 17   '$ Temp: 98.7 F (37.1 C)    TempSrc: Oral    SpO2: 98% 100% 93%    Physical Exam Constitutional:      General: He is not in acute distress.    Appearance: Normal appearance.  HENT:     Head: Normocephalic and atraumatic.     Mouth/Throat:     Mouth: Mucous membranes are moist.     Pharynx: Oropharynx is clear.  Eyes:     Extraocular Movements: Extraocular movements intact.     Pupils: Pupils are equal, round, and reactive to light.  Cardiovascular:     Rate and Rhythm: Normal rate and regular rhythm.     Pulses: Normal pulses.  Heart sounds: Normal heart sounds.  Pulmonary:     Effort: Pulmonary effort is normal. No respiratory distress.     Breath sounds: Normal breath sounds.  Abdominal:     General: Bowel sounds are normal. There is no distension.     Palpations: Abdomen is soft.     Tenderness: There is no abdominal tenderness.  Musculoskeletal:        General: No swelling or deformity.  Skin:    General: Skin is warm and dry.  Neurological:     General: No focal deficit present.     Mental Status: Mental status is at baseline.     Gait: Gait abnormal (Reported, not observed).    Labs on Admission: I have personally reviewed following labs and imaging studies  CBC: Recent Labs  Lab 09/01/21 1657  WBC 8.4  NEUTROABS 5.6  HGB 18.1*  HCT 53.6*  MCV 88.0  PLT 831    Basic Metabolic Panel: Recent Labs  Lab 09/01/21 1657  NA 137  K 4.0  CL 98  CO2 23  GLUCOSE 246*  BUN 28*  CREATININE 1.84*  CALCIUM 9.6    GFR: CrCl cannot be calculated (Unknown ideal weight.).  Liver Function Tests: Recent Labs  Lab 09/01/21 1657  AST 18  ALT 23  ALKPHOS 114  BILITOT 0.8  PROT 7.4  ALBUMIN 4.0    Urine analysis:    Component Value Date/Time   COLORURINE STRAW (A) 07/11/2021 1549   APPEARANCEUR CLEAR 07/11/2021 1549   APPEARANCEUR Clear 12/12/2020 1046   LABSPEC 1.023 07/11/2021 1549   PHURINE 6.0 07/11/2021 1549   GLUCOSEU >=500 (A)  07/11/2021 1549   HGBUR NEGATIVE 07/11/2021 1549   BILIRUBINUR NEGATIVE 07/11/2021 1549   BILIRUBINUR Negative 12/12/2020 1046   KETONESUR NEGATIVE 07/11/2021 1549   PROTEINUR 30 (A) 07/11/2021 1549   UROBILINOGEN 0.2 08/26/2019 1516   UROBILINOGEN 0.2 04/18/2008 1153   NITRITE NEGATIVE 07/11/2021 1549   LEUKOCYTESUR NEGATIVE 07/11/2021 1549    Radiological Exams on Admission: MR BRAIN WO CONTRAST  Result Date: 09/01/2021 CLINICAL DATA:  Initial evaluation for acute dizziness. EXAM: MRI HEAD WITHOUT CONTRAST TECHNIQUE: Multiplanar, multiecho pulse sequences of the brain and surrounding structures were obtained without intravenous contrast. COMPARISON:  Prior CT from earlier the same day. FINDINGS: Brain: Generalized age-related cerebral atrophy. Patchy and confluent T2/FLAIR hyperintensity involving the periventricular deep white matter both cerebral hemispheres, most consistent with chronic small vessel ischemic disease, moderately advanced in nature. No evidence for acute or subacute infarct. Gray-white matter differentiation maintained. No areas of chronic cortical infarction. No acute or chronic intracranial blood products. No mass lesion, midline shift or mass effect. Mild ventricular prominence related global parenchymal volume loss of hydrocephalus. No extra-axial fluid collection. Pituitary gland and suprasellar region normal. Vascular: Major intracranial vascular flow voids are maintained. Skull and upper cervical spine: Craniocervical junction normal. Bone marrow signal intensity within normal limits. No scalp soft tissue abnormality 5 Sinuses/Orbits: Globes and orbital soft tissues within normal limits. Paranasal sinuses are largely clear. No mastoid effusion. Other: None. IMPRESSION: 1. No acute intracranial abnormality. 2. Moderately advanced chronic microvascular ischemic disease for age. Electronically Signed   By: Jeannine Boga M.D.   On: 09/01/2021 23:06   DG Chest Portable 1  View  Result Date: 09/01/2021 CLINICAL DATA:  Evaluate for pulmonary edema. EXAM: PORTABLE CHEST 1 VIEW COMPARISON:  Chest radiograph dated 05/30/2021. FINDINGS: No focal consolidation, pleural effusion, pneumothorax. The cardiac silhouette is within limits. No acute osseous pathology.  IMPRESSION: No active disease. Electronically Signed   By: Anner Crete M.D.   On: 09/01/2021 19:36   CT HEAD WO CONTRAST (5MM)  Result Date: 09/01/2021 CLINICAL DATA:  Dizziness, persistent/recurrent, cardiac or vascular cause suspected. EXAM: CT HEAD WITHOUT CONTRAST TECHNIQUE: Contiguous axial images were obtained from the base of the skull through the vertex without intravenous contrast. RADIATION DOSE REDUCTION: This exam was performed according to the departmental dose-optimization program which includes automated exposure control, adjustment of the mA and/or kV according to patient size and/or use of iterative reconstruction technique. COMPARISON:  CT head without contrast 09/28/2019 FINDINGS: Brain: Moderate atrophy and white matter disease has progressed. No acute infarct, hemorrhage, or mass lesion is present. The ventricles are proportionate to the degree of atrophy. No significant extraaxial fluid collection is present. The brainstem and cerebellum are within normal limits. Vascular: Atherosclerotic calcifications are present within the cavernous internal carotid arteries. Additional calcifications are present at the dural margin of the vertebral arteries. No hyperdense vessel is present. Skull: Calvarium is intact. No focal lytic or blastic lesions are present. No significant extracranial soft tissue lesion is present. Sinuses/Orbits: The paranasal sinuses and mastoid air cells are clear. The globes and orbits are within normal limits. IMPRESSION: 1. Progressive atrophy and white matter disease. This likely reflects the sequela of chronic microvascular ischemia. 2. No acute intracranial abnormality.  Electronically Signed   By: San Morelle M.D.   On: 09/01/2021 18:10    EKG: Independently reviewed.  Sinus rhythm at 70 bpm.  Low voltage in precordial leads.  Baseline artifact.   Assessment/Plan Principal Problem:   Syncope and collapse Active Problems:   Microcytic anemia   H/O post-polio syndrome   Benign hypertension   Depressive disorder   Type 2 diabetes mellitus with diabetic nephropathy, without long-term current use of insulin (HCC)   Essential hypertension   Chronic kidney disease (CKD), stage III (moderate) (HCC)   HLD (hyperlipidemia)   Syncope and collapse > Patient presenting with episode of dizziness and syncope and collapse. > Unclear etiology for dizziness, work-up as below. > There is a possibility that diabetic autonomic neuropathy could be playing a role in both. > States he has passed out and possibly hit his head.  CT head without acute abnormality in the ED. - We will monitor on telemetry - Check echocardiogram - Orthostatic vital signs - IV fluids overnight  Dizziness > Unclear etiology.  Had some work-up at outside hospital in May.  Thought may be due to diabetic autonomic neuropathy at that time which remains on differential. > Also is taking several medications that have association with gait abnormality and dizziness.  Abilify has been associated with gait abnormality and less than 1% of cases, trazodone associated with dizziness and 20 to 28% of cases, alfuzosin associated with dizziness as well. > Has had CT head and MRI brain not showing any evidence of central etiology such as NPH. > Lab work-up including vitamins and RPR pending in the ED. > Does have history in chart of previously having postpolio syndrome, unclear how this may or may not be related. - Monitor on telemetry as above - Holding home Abilify, trazodone, alfuzosin - Follow-up B12, folate, RPR - Follow-up - If fails to improve or initial work-up unrevealing may benefit from  neurology consult  Hypertension - Holding home amlodipine in the setting of syncopal episodes  Depression Anxiety Insomnia - Holding home Abilify, trazodone in the setting of association with gait abnormality and dizziness  Diabetes -  SSI  CKD 3 > Creatinine near baseline but possibly mildly elevated at 1.84 from baseline of near 1.6. > Received 1 L of IV fluids in the ED - We will continue IV fluids overnight - Trend renal function and electrolytes  History of prostate cancer - Continue home finasteride - Holding home alfuzosin due to association with dizziness  Hyperlipidemia - Continue home atorvastatin  DVT prophylaxis: Lovenox Code Status:   Full Family Communication:  None on admission. States his male friend is up to date and does not need anyone else notified.  Disposition Plan:   Patient is from:  Home  Anticipated DC to:  Home  Anticipated DC date:  1 to 3 days  Anticipated DC barriers: None  Consults called:  None Admission status:  Observation, telemetry  Severity of Illness: The appropriate patient status for this patient is OBSERVATION. Observation status is judged to be reasonable and necessary in order to provide the required intensity of service to ensure the patient's safety. The patient's presenting symptoms, physical exam findings, and initial radiographic and laboratory data in the context of their medical condition is felt to place them at decreased risk for further clinical deterioration. Furthermore, it is anticipated that the patient will be medically stable for discharge from the hospital within 2 midnights of admission.    Marcelyn Bruins MD [facility]  How to contact the [facility] Attending or Consulting provider [place] or covering provider during after hours [place], for this patient?   Check the care team in [facility] and look for a) attending/consulting [facility] provider listed and b) the [facility] team listed Log into [email or web address] and use [facility]'s universal password to access. If you do not have the password, please contact the hospital operator. Locate the [facility] provider you are looking for under [facility] and page to a number that you can be directly reached. If you still have difficulty reaching the provider, please page the [facility] (Director on Call) for the Hospitalists listed on amion for assistance.  09/02/2021, 12:12 AM

## 2021-09-03 ENCOUNTER — Observation Stay (HOSPITAL_BASED_OUTPATIENT_CLINIC_OR_DEPARTMENT_OTHER): Payer: Medicare Other

## 2021-09-03 ENCOUNTER — Encounter (HOSPITAL_COMMUNITY): Payer: Self-pay | Admitting: Internal Medicine

## 2021-09-03 ENCOUNTER — Observation Stay (HOSPITAL_COMMUNITY)
Admit: 2021-09-03 | Discharge: 2021-09-03 | Disposition: A | Discharge status: Home / Self Care | Payer: Medicare Other | Attending: Internal Medicine | Admitting: Internal Medicine

## 2021-09-03 ENCOUNTER — Observation Stay (HOSPITAL_COMMUNITY): Payer: Medicare Other

## 2021-09-03 ENCOUNTER — Other Ambulatory Visit: Payer: Self-pay

## 2021-09-03 DIAGNOSIS — R531 Weakness: Secondary | ICD-10-CM | POA: Diagnosis not present

## 2021-09-03 DIAGNOSIS — R42 Dizziness and giddiness: Secondary | ICD-10-CM | POA: Diagnosis not present

## 2021-09-03 DIAGNOSIS — R55 Syncope and collapse: Secondary | ICD-10-CM

## 2021-09-03 DIAGNOSIS — N1831 Chronic kidney disease, stage 3a: Secondary | ICD-10-CM | POA: Diagnosis not present

## 2021-09-03 DIAGNOSIS — I10 Essential (primary) hypertension: Secondary | ICD-10-CM | POA: Diagnosis not present

## 2021-09-03 LAB — GLUCOSE, CAPILLARY: Glucose-Capillary: 252 mg/dL — ABNORMAL HIGH (ref 70–99)

## 2021-09-03 MED ORDER — TETRAHYDROZOLINE HCL 0.05 % OP SOLN
1.0000 [drp] | Freq: Every day | OPHTHALMIC | Status: DC | PRN
  Filled 2021-09-03: qty 15

## 2021-09-03 MED ORDER — TRAZODONE HCL 50 MG PO TABS
50.0000 mg | ORAL_TABLET | Freq: Every evening | ORAL | Status: DC | PRN
  Administered 2021-09-03 – 2021-09-10 (×6): 50 mg via ORAL
  Filled 2021-09-03 (×6): qty 1

## 2021-09-03 MED ORDER — ASPIRIN 81 MG PO TBEC
81.0000 mg | DELAYED_RELEASE_TABLET | Freq: Every day | ORAL | Status: DC
  Administered 2021-09-04 – 2021-09-11 (×8): 81 mg via ORAL
  Filled 2021-09-03 (×8): qty 1

## 2021-09-03 NOTE — Progress Notes (Signed)
EEG complete - results pending 

## 2021-09-03 NOTE — Procedures (Signed)
Patient Name: ADRYEN COOKSON  MRN: [id number]  Epilepsy Attending: Lora Havens  Referring Physician/Provider: Hosie Poisson, MD  Date: 09/03/2021 Duration: 23.04 mins  Patient history: 66yo m with syncope. EEG to evaluate for seizure.   Level of alertness: Awake  AEDs during EEG study: None  Technical aspects: This EEG study was done with scalp electrodes positioned according to the 10-20 International system of electrode placement. Electrical activity was acquired at a sampling rate of '500Hz'$  and reviewed with a high frequency filter of '70Hz'$  and a low frequency filter of '1Hz'$ . EEG data were recorded continuously and digitally stored.   Description: The posterior dominant rhythm consists of 9 Hz activity of moderate voltage (25-35 uV) seen predominantly in posterior head regions, symmetric and reactive to eye opening and eye closing.  Hyperventilation and photic stimulation were not performed.     IMPRESSION: This study is within normal limits. No seizures or epileptiform discharges were seen throughout the recording.  Yun Gutierrez Barbra Sarks

## 2021-09-03 NOTE — Progress Notes (Signed)
2D echo attempted, but EEG in progress and tech stated need 20 more minutes. Will try later

## 2021-09-03 NOTE — Progress Notes (Signed)
[facility]                                                                               [name], is a 66 y.o. male, DOB - [birth year], [place] date - 09/01/2021    Outpatient Primary MD for the patient is Pcp, No  LOS - 0  days    Brief summary   [name] is a 66 y.o. male with medical history significant of anemia, history of postpolio syndrome, hypertension, depression, anxiety, insomnia, diabetes, CKD 3, hyperlipidemia, prostate cancer, GERD, macular degeneration presenting with dizziness. .  CT head showed no acute abnormality but demonstrate progressive atrophy and white matter disease consistent with chronic microvascular disease.  MRI of the brain showed no acute abnormality,.   He was admitted for evaluation of syncope and dizziness.    Assessment & Plan    Assessment and Plan:  Intermittent dizziness and Syncope:  Differential include autonomic dysregulation vs neurological etiology vs arrhythmias.  CT head showed no acute abnormality but demonstrate progressive atrophy and white matter disease consistent with chronic microvascular disease.  MRI of the brain showed no acute abnormality,. Orthostatic vital signs are negative.  Echocardiogram pending Carotid duplex pending.  EEG ordered.  Over night telemetry is wnl.  If all the work up is negative, recommend event monitor placement and follo wup with cardiology.     Dizziness:  Appears to have improved.  Check for PT vestibular evaluation.  Also is taking several medications that have association with gait abnormality and dizziness.  Abilify has been associated with gait abnormality and less than 1% of cases, trazodone associated with dizziness and 20 to 28% of cases, alfuzosin associated with dizziness as well. Vitamin b12 levels wnl, RPR is negative.  Folate wnl.  Recommend outpatient follow up with Neurology.     Hypertension:  BP slightly elevated.  Orthostatic vital  signs are negative.    Depression, anxiety  Holding abilify and trazodone in the setting of dizziness.     Stage 3a CKD Creatinine stable at 1.66.    DM: CBG (last 3)  Recent Labs    09/03/21 0719  GLUCAP 252*   Resume SSI.    H/o prostate cancer:  Continue with finasteride.    Hyperlipidemia:  Continue with lipitor.       Estimated body mass index is 26.65 kg/m as calculated from the following:   Height as of this encounter: '5\' 6"'$  (1.676 m).   Weight as of this encounter: 74.9 kg.  Code Status: full code.  DVT Prophylaxis:  enoxaparin (LOVENOX) injection 40 mg Start: 09/02/21 1000   Level of Care: Level of care: Telemetry Medical Family Communication: None at bedside.   Disposition Plan:     Remains inpatient appropriate:  further work up for syncope.   Procedures:  MRI brain Carotid duplex EEG.   Consultants:   None.   Antimicrobials:   Anti-infectives (From admission, onward)    None        Medications  Scheduled Meds:  atorvastatin  40 mg Oral Daily   enoxaparin (LOVENOX) injection  40 mg Subcutaneous Daily   finasteride  5 mg Oral  Daily   sodium chloride flush  3 mL Intravenous Q12H   thiamine injection  100 mg Intravenous Once   Continuous Infusions: PRN Meds:.acetaminophen **OR** acetaminophen, polyethylene glycol    Subjective:   Linell Shawn was seen and examined today.  NO DIZZINESS TODAY.   Objective:   Vitals:   09/02/21 2048 09/03/21 0014 09/03/21 0716 09/03/21 0923  BP: 127/80 (!) 129/98 (!) 134/95 (!) 145/99  Pulse: 62 60 80 85  Resp: '18 16 18 18  '$ Temp: 98.1 F (36.7 C) 98 F (36.7 C) 97.8 F (36.6 C)   TempSrc: Oral Oral    SpO2: 99% 98% 95% 98%  Weight:      Height:        Intake/Output Summary (Last 24 hours) at 09/03/2021 1119 Last data filed at 09/03/2021 0600 Gross per 24 hour  Intake 463 ml  Output 1150 ml  Net -687 ml   Filed Weights   09/02/21 1600  Weight: 74.9 kg     Exam General  exam: Appears calm and comfortable  Respiratory system: Clear to auscultation. Respiratory effort normal. Cardiovascular system: S1 & S2 heard, RRR. No JVD, No pedal edema. Gastrointestinal system: Abdomen is nondistended, soft and nontender. Normal bowel sounds heard. Central nervous system: Alert and oriented. No focal neurological deficits. Extremities: Symmetric 5 x 5 power. Skin: No rashes, lesions or ulcers Psychiatry:  Mood & affect appropriate.    Data Reviewed:  I have personally reviewed following labs and imaging studies   CBC Lab Results  Component Value Date   WBC 7.6 09/02/2021   RBC 5.99 (H) 09/02/2021   HGB 17.6 (H) 09/02/2021   HCT 53.6 (H) 09/02/2021   MCV 89.5 09/02/2021   MCH 29.4 09/02/2021   PLT 194 09/02/2021   MCHC 32.8 09/02/2021   RDW 12.8 09/02/2021   LYMPHSABS 1.7 09/01/2021   MONOABS 0.8 09/01/2021   EOSABS 0.2 09/01/2021   BASOSABS 0.1 23/55/7322     Last metabolic panel Lab Results  Component Value Date   NA 136 09/02/2021   K 3.6 09/02/2021   CL 103 09/02/2021   CO2 24 09/02/2021   BUN 27 (H) 09/02/2021   CREATININE 1.66 (H) 09/02/2021   GLUCOSE 256 (H) 09/02/2021   GFRNONAA 45 (L) 09/02/2021   GFRAA 48 (L) 03/13/2020   CALCIUM 9.1 09/02/2021   PHOS 4.4 10/27/2020   PROT 6.5 09/02/2021   ALBUMIN 3.3 (L) 09/02/2021   LABGLOB 2.6 07/17/2020   AGRATIO 1.6 07/17/2020   BILITOT 0.8 09/02/2021   ALKPHOS 93 09/02/2021   AST 15 09/02/2021   ALT 19 09/02/2021   ANIONGAP 9 09/02/2021    CBG (last 3)  Recent Labs    09/03/21 0719  GLUCAP 252*      Coagulation Profile: No results for input(s): "INR", "PROTIME" in the last 168 hours.   Radiology Studies: MR BRAIN WO CONTRAST  Result Date: 09/01/2021 CLINICAL DATA:  Initial evaluation for acute dizziness. EXAM: MRI HEAD WITHOUT CONTRAST TECHNIQUE: Multiplanar, multiecho pulse sequences of the brain and surrounding structures were obtained without intravenous contrast.  COMPARISON:  Prior CT from earlier the same day. FINDINGS: Brain: Generalized age-related cerebral atrophy. Patchy and confluent T2/FLAIR hyperintensity involving the periventricular deep white matter both cerebral hemispheres, most consistent with chronic small vessel ischemic disease, moderately advanced in nature. No evidence for acute or subacute infarct. Gray-white matter differentiation maintained. No areas of chronic cortical infarction. No acute or chronic intracranial blood products. No mass lesion, midline shift or  mass effect. Mild ventricular prominence related global parenchymal volume loss of hydrocephalus. No extra-axial fluid collection. Pituitary gland and suprasellar region normal. Vascular: Major intracranial vascular flow voids are maintained. Skull and upper cervical spine: Craniocervical junction normal. Bone marrow signal intensity within normal limits. No scalp soft tissue abnormality 5 Sinuses/Orbits: Globes and orbital soft tissues within normal limits. Paranasal sinuses are largely clear. No mastoid effusion. Other: None. IMPRESSION: 1. No acute intracranial abnormality. 2. Moderately advanced chronic microvascular ischemic disease for age. Electronically Signed   By: Jeannine Boga M.D.   On: 09/01/2021 23:06   DG Chest Portable 1 View  Result Date: 09/01/2021 CLINICAL DATA:  Evaluate for pulmonary edema. EXAM: PORTABLE CHEST 1 VIEW COMPARISON:  Chest radiograph dated 05/30/2021. FINDINGS: No focal consolidation, pleural effusion, pneumothorax. The cardiac silhouette is within limits. No acute osseous pathology. IMPRESSION: No active disease. Electronically Signed   By: Anner Crete M.D.   On: 09/01/2021 19:36   CT HEAD WO CONTRAST (5MM)  Result Date: 09/01/2021 CLINICAL DATA:  Dizziness, persistent/recurrent, cardiac or vascular cause suspected. EXAM: CT HEAD WITHOUT CONTRAST TECHNIQUE: Contiguous axial images were obtained from the base of the skull through the vertex  without intravenous contrast. RADIATION DOSE REDUCTION: This exam was performed according to the departmental dose-optimization program which includes automated exposure control, adjustment of the mA and/or kV according to patient size and/or use of iterative reconstruction technique. COMPARISON:  CT head without contrast 09/28/2019 FINDINGS: Brain: Moderate atrophy and white matter disease has progressed. No acute infarct, hemorrhage, or mass lesion is present. The ventricles are proportionate to the degree of atrophy. No significant extraaxial fluid collection is present. The brainstem and cerebellum are within normal limits. Vascular: Atherosclerotic calcifications are present within the cavernous internal carotid arteries. Additional calcifications are present at the dural margin of the vertebral arteries. No hyperdense vessel is present. Skull: Calvarium is intact. No focal lytic or blastic lesions are present. No significant extracranial soft tissue lesion is present. Sinuses/Orbits: The paranasal sinuses and mastoid air cells are clear. The globes and orbits are within normal limits. IMPRESSION: 1. Progressive atrophy and white matter disease. This likely reflects the sequela of chronic microvascular ischemia. 2. No acute intracranial abnormality. Electronically Signed   By: San Morelle M.D.   On: 09/01/2021 18:10       Hosie Poisson M.D. [facility] 09/03/2021, 11:19 AM  Available via Epic secure chat 7am-7pm After 7 pm, please refer to night coverage provider listed on amion.

## 2021-09-03 NOTE — Progress Notes (Signed)
Bilateral carotid duplex study completed. Please see CV Proc for preliminary results.  Macklen Wilhoite BS, RVT 09/03/2021 12:21 PM

## 2021-09-03 NOTE — Care Management Obs Status (Signed)
Helper NOTIFICATION   Patient Details  Name: [name] MRN: [id number] Date of Birth: March 08, 1956   Medicare Observation Status Notification Given:  Yes    Tom-Johnson, Renea Ee, RN 09/03/2021, 11:25 AM

## 2021-09-03 NOTE — TOC Initial Note (Addendum)
[facility]) - Initial/Assessment Note    Patient Details  Name: [name] MRN: [id number] Date of Birth: May 05, 1955  [facility] CM/SW Contact:    Tom-Johnson, Renea Ee, RN Phone Number: 09/03/2021, 3:28 PM  Clinical Narrative:                  CM spoke with patient at bedside about needs for post hospital transition. Admitted for Syncope.  From home alone. Does not have children. Has two sisters. Does not drive, uses DSS transportation.  Does not have a PCP. Hospital f/u scheduled with [facility] per patient's request, info on AVS.  CM unable to secure home health for patient d/t non compliance and patient not having a PCP to write orders and followup. MD made aware to place Outpatient order. Patient states he has transportation to take him to his appointments.  CM will continue to follow with needs.   Expected Discharge Plan: [place] Barriers to Discharge: Continued Medical Work up   Patient Goals and CMS Choice Patient states their goals for this hospitalization and ongoing recovery are:: To return home CMS Medicare.gov Compare Post Acute Care list provided to:: Patient Choice offered to / list presented to : Patient  Expected Discharge Plan and Services Expected Discharge Plan: [place]   Discharge Planning Services: CM Consult Post Acute Care Choice: [place] arrangements for the past 2 months: Apartment                 DME Arranged: Tub bench DME Agency: [employer] Date DME Agency Contacted: 09/03/21 Time DME Agency Contacted: [id number] Representative spoke with at DME Agency: Jodell [facility]ipro HH Arranged:  (Unable to get ome health. Outpatient instead.) HH Agency: NA        Prior Living Arrangements/Services Living arrangements for the past 2 months: Apartment Lives with:: Self Patient language and need for interpreter reviewed:: Yes Do you feel safe going back to the place where you  live?: Yes      Need for Family Participation in Patient Care: Yes (Comment) Care giver support system in place?: Yes (comment)   Criminal Activity/Legal Involvement Pertinent to Current Situation/Hospitalization: No - Comment as needed  Activities of Daily Living Home Assistive Devices/Equipment: None ADL Screening (condition at time of admission) Patient's cognitive ability adequate to safely complete daily activities?: Yes Is the patient deaf or have difficulty hearing?: No Does the patient have difficulty seeing, even when wearing glasses/contacts?: No Does the patient have difficulty concentrating, remembering, or making decisions?: No Patient able to express need for assistance with ADLs?: Yes Does the patient have difficulty dressing or bathing?: No Independently performs ADLs?: Yes (appropriate for developmental age) Does the patient have difficulty walking or climbing stairs?: Yes Weakness of Legs: Both Weakness of Arms/Hands: None  Permission Sought/Granted Permission sought to share information with : Case Manager, Customer service manager, Family Supports Permission granted to share information with : Yes, Verbal Permission Granted              Emotional Assessment Appearance:: Appears stated age Attitude/Demeanor/Rapport: Engaged, Gracious Affect (typically observed): Accepting, Appropriate, Calm, Hopeful Orientation: : Oriented to Self, Oriented to Place, Oriented to  Time, Oriented to Situation Alcohol / Substance Use: Not Applicable Psych Involvement: No (comment)  Admission diagnosis:  Syncope and collapse [R55] Dizziness [R42] Gait instability [R26.81] Chronic kidney disease, unspecified CKD stage [N18.9] Recurrent syncope [R55] Patient Active Problem List   Diagnosis Date Noted  Syncope and collapse 09/02/2021   History of prostate cancer with recurrent urinary obstruction  03/24/2021   Polycythemia 03/24/2021   COVID-19 virus infection  03/24/2021   History of noncompliance with medical treatment 03/24/2021   HLD (hyperlipidemia) 01/05/2021   Acute urinary retention 10/25/2020   Adjustment disorder with mixed anxiety and depressed mood    Ataxia 08/01/2020   Dizziness 07/31/2020   Hydroureteronephrosis 02/10/2020   Nausea and vomiting 02/10/2020   Pneumonia due to COVID-19 virus 09/29/2019   Hyponatremia 09/28/2019   Sinus tachycardia 09/28/2019   Insomnia 08/26/2018   Nephropathy 03/26/2018   Urinary urgency 03/26/2018   Type 2 diabetes mellitus with diabetic nephropathy, without long-term current use of insulin ([place]) 03/26/2018   Essential hypertension 03/26/2018   Chronic kidney disease (CKD), stage III (moderate) ([place]) 03/26/2018   Conductive hearing loss, bilateral 11/07/2017   Microcytic anemia 02/17/2012   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   Benign hypertension 02/17/2012   Depressive disorder 02/17/2012   PCP:  Pcp, No Pharmacy:   [id number] [place] [place]Phone: [phone]: [phone] [facility] [address]: [phone]: [phone]    Social Determinants of Health (SDOH) Interventions    Readmission Risk Interventions    10/05/2019    4:42 PM  Readmission Risk Prevention Plan  Transportation Screening Complete  PCP or Specialist Appt within 5-7 Days Complete  Home Care Screening Complete  Medication Review (RN CM) Complete

## 2021-09-03 NOTE — Plan of Care (Signed)
  Problem: Activity: Goal: Risk for activity intolerance will decrease Outcome: Progressing   Problem: Nutrition: Goal: Adequate nutrition will be maintained Outcome: Progressing   Problem: Elimination: Goal: Will not experience complications related to urinary retention Outcome: Progressing   

## 2021-09-03 NOTE — Progress Notes (Signed)
TRH night cross cover note:   I was notified by RN of the patient's request for resumption of his home trazodone 50 mg p.o. nightly for sleep.  I subsequently resumed this home medication, but on as needed basis for insomnia.    Babs Bertin, DO Hospitalist

## 2021-09-03 NOTE — Progress Notes (Signed)
PT Cancellation Note  Patient Details Name: [name] MRN: [id number] DOB: 02-Feb-1956   Cancelled Treatment:    Reason Eval/Treat Not Completed: Patient at procedure or test/unavailable  Patient continues to be attached to EEG machine. Will attempt to see 09/04/21   Arby Barrette, [place]  Office [phone]   Rexanne Mano 09/03/2021, 3:26 PM

## 2021-09-04 ENCOUNTER — Observation Stay (HOSPITAL_COMMUNITY): Payer: Medicare Other

## 2021-09-04 ENCOUNTER — Observation Stay (HOSPITAL_BASED_OUTPATIENT_CLINIC_OR_DEPARTMENT_OTHER): Payer: Medicare Other

## 2021-09-04 DIAGNOSIS — I10 Essential (primary) hypertension: Secondary | ICD-10-CM | POA: Diagnosis not present

## 2021-09-04 DIAGNOSIS — R42 Dizziness and giddiness: Secondary | ICD-10-CM | POA: Diagnosis not present

## 2021-09-04 DIAGNOSIS — R55 Syncope and collapse: Secondary | ICD-10-CM | POA: Diagnosis not present

## 2021-09-04 DIAGNOSIS — N1831 Chronic kidney disease, stage 3a: Secondary | ICD-10-CM | POA: Diagnosis not present

## 2021-09-04 LAB — ECHOCARDIOGRAM COMPLETE
AR max vel: 3.65 cm2
AV Area VTI: 3.37 cm2
AV Area mean vel: 3.54 cm2
AV Mean grad: 4 mmHg
AV Peak grad: 6.6 mmHg
Ao pk vel: 1.28 m/s
Area-P 1/2: 2.02 cm2
Height: 66 in
S' Lateral: 2.9 cm
Weight: 2608 oz

## 2021-09-04 LAB — HEMOGLOBIN A1C
Hgb A1c MFr Bld: 11 % — ABNORMAL HIGH (ref 4.8–5.6)
Mean Plasma Glucose: 269 mg/dL

## 2021-09-04 LAB — GLUCOSE, CAPILLARY
Glucose-Capillary: 294 mg/dL — ABNORMAL HIGH (ref 70–99)
Glucose-Capillary: 430 mg/dL — ABNORMAL HIGH (ref 70–99)
Glucose-Capillary: 212 mg/dL — ABNORMAL HIGH (ref 70–99)

## 2021-09-04 MED ORDER — INSULIN ASPART 100 UNIT/ML IJ SOLN
0.0000 [IU] | Freq: Three times a day (TID) | INTRAMUSCULAR | Status: DC
  Administered 2021-09-04: 9 [IU] via SUBCUTANEOUS
  Administered 2021-09-05 (×2): 3 [IU] via SUBCUTANEOUS
  Administered 2021-09-05: 5 [IU] via SUBCUTANEOUS
  Administered 2021-09-06 (×3): 3 [IU] via SUBCUTANEOUS
  Administered 2021-09-07 (×2): 2 [IU] via SUBCUTANEOUS
  Administered 2021-09-07: 3 [IU] via SUBCUTANEOUS
  Administered 2021-09-08 (×2): 2 [IU] via SUBCUTANEOUS
  Administered 2021-09-08: 7 [IU] via SUBCUTANEOUS
  Administered 2021-09-09: 2 [IU] via SUBCUTANEOUS
  Administered 2021-09-09: 5 [IU] via SUBCUTANEOUS
  Administered 2021-09-09: 2 [IU] via SUBCUTANEOUS
  Administered 2021-09-10: 3 [IU] via SUBCUTANEOUS
  Administered 2021-09-10 (×2): 5 [IU] via SUBCUTANEOUS
  Administered 2021-09-11 (×2): 2 [IU] via SUBCUTANEOUS

## 2021-09-04 MED ORDER — INSULIN ASPART 100 UNIT/ML IJ SOLN
0.0000 [IU] | Freq: Every day | INTRAMUSCULAR | Status: DC
  Administered 2021-09-04: 2 [IU] via SUBCUTANEOUS
  Administered 2021-09-05: 3 [IU] via SUBCUTANEOUS
  Administered 2021-09-06: 4 [IU] via SUBCUTANEOUS
  Administered 2021-09-07: 5 [IU] via SUBCUTANEOUS
  Administered 2021-09-08: 4 [IU] via SUBCUTANEOUS
  Administered 2021-09-09: 2 [IU] via SUBCUTANEOUS

## 2021-09-04 NOTE — Progress Notes (Signed)
  Echocardiogram 2D Echocardiogram has been performed.  [name] 09/04/2021, 3:53 PM

## 2021-09-04 NOTE — Progress Notes (Signed)
[facility]                                                                               [name], is a 66 y.o. male, DOB - 09-29-1955, [place] date - 09/01/2021    Outpatient Primary MD for the patient is Pcp, No  LOS - 0  days    Brief summary   [name] is a 66 y.o. male with medical history significant of anemia, history of postpolio syndrome, hypertension, depression, anxiety, insomnia, diabetes, CKD 3, hyperlipidemia, prostate cancer, GERD, macular degeneration presenting with dizziness. .  CT head showed no acute abnormality but demonstrate progressive atrophy and white matter disease consistent with chronic microvascular disease.  MRI of the brain showed no acute abnormality,.   He was admitted for evaluation of syncope and dizziness.    Assessment & Plan    Assessment and Plan:  Intermittent dizziness and Syncope:  Differential include autonomic dysregulation vs neurological etiology vs arrhythmias.  CT head showed no acute abnormality but demonstrate progressive atrophy and white matter disease consistent with chronic microvascular disease.  MRI of the brain showed no acute abnormality,. Orthostatic vital signs are negative.  Echocardiogram pending Carotid duplex is unremarkable.  EEG ordered and is negative for active seizures.  Over night telemetry is wnl.  If all the work up is negative, recommend event monitor placement and follow up with cardiology as outpatient.     Dizziness:  Appears to have improved but not completely resolved.  Check for PT vestibular evaluation.  Also is taking several medications that have association with gait abnormality and dizziness.  Abilify has been associated with gait abnormality and less than 1% of cases, trazodone associated with dizziness and 20 to 28% of cases, alfuzosin associated with dizziness as well. Vitamin b12 levels wnl, RPR is negative.  Folate wnl.  Recommend outpatient follow up  with Neurology.     Hypertension:  BP parameters are optimal today.  Orthostatic vital signs are negative.    Depression, anxiety  Holding abilify and trazodone in the setting of dizziness.     Stage 3a CKD Creatinine stable at 1.66. Recheck creatinine in am.     DM: CBG (last 3)  Recent Labs    09/03/21 0719 09/04/21 0723  GLUCAP 252* 294*   Check A1c .  Resume SSI.    H/o prostate cancer:  Continue with finasteride.    Hyperlipidemia:  Continue with lipitor.       Estimated body mass index is 26.31 kg/m as calculated from the following:   Height as of this encounter: '5\' 6"'$  (1.676 m).   Weight as of this encounter: 73.9 kg.  Code Status: full code.  DVT Prophylaxis:  enoxaparin (LOVENOX) injection 40 mg Start: 09/02/21 1000   Level of Care: Level of care: Telemetry Medical Family Communication: None at bedside.   Disposition Plan:     Remains inpatient appropriate: possible d/c home in the next 24 hours after echocardiogram is done.   Procedures:  MRI brain Carotid duplex EEG.  Echocardiogram.   Consultants:   None.   Antimicrobials:   Anti-infectives (From admission, onward)    None  Medications  Scheduled Meds:  aspirin EC  81 mg Oral Q breakfast   atorvastatin  40 mg Oral Daily   enoxaparin (LOVENOX) injection  40 mg Subcutaneous Daily   finasteride  5 mg Oral Daily   sodium chloride flush  3 mL Intravenous Q12H   Continuous Infusions: PRN Meds:.acetaminophen **OR** acetaminophen, polyethylene glycol, tetrahydrozoline, traZODone    Subjective:   [name] was seen and examined today.  Dizziness has improved. No chest pain or sob.  No headache.   Objective:   Vitals:   09/03/21 2102 09/04/21 0433 09/04/21 0500 09/04/21 0851  BP: 133/87 (!) 97/59  117/72  Pulse: 82 67  73  Resp: '16 20  17  '$ Temp: 99.1 F (37.3 C) 98.3 F (36.8 C)  98 F (36.7 C)  TempSrc: Oral Oral  Oral  SpO2: 97% 96%  95%  Weight:    73.9 kg   Height:        Intake/Output Summary (Last 24 hours) at 09/04/2021 1456 Last data filed at 09/04/2021 0902 Gross per 24 hour  Intake 840 ml  Output 1120 ml  Net -280 ml    Filed Weights   09/02/21 1600 09/04/21 0500  Weight: 74.9 kg 73.9 kg     Exam General exam: Elderly gentleman not in distress.  Respiratory system: Clear to auscultation. Respiratory effort normal. Cardiovascular system: S1 & S2 heard, RRR. No JVD,  No pedal edema. Gastrointestinal system: Abdomen is nondistended, soft and nontender.  Normal bowel sounds heard. Central nervous system: Alert and oriented. No focal neurological deficits. Extremities: Symmetric 5 x 5 power. Skin: No rashes, lesions or ulcers Psychiatry:  Mood & affect appropriate.     Data Reviewed:  I have personally reviewed following labs and imaging studies   CBC Lab Results  Component Value Date   WBC 7.6 09/02/2021   RBC 5.99 (H) 09/02/2021   HGB 17.6 (H) 09/02/2021   HCT 53.6 (H) 09/02/2021   MCV 89.5 09/02/2021   MCH 29.4 09/02/2021   PLT 194 09/02/2021   MCHC 32.8 09/02/2021   RDW 12.8 09/02/2021   LYMPHSABS 1.7 09/01/2021   MONOABS 0.8 09/01/2021   EOSABS 0.2 09/01/2021   BASOSABS 0.1 24/23/5361     Last metabolic panel Lab Results  Component Value Date   NA 136 09/02/2021   K 3.6 09/02/2021   CL 103 09/02/2021   CO2 24 09/02/2021   BUN 27 (H) 09/02/2021   CREATININE 1.66 (H) 09/02/2021   GLUCOSE 256 (H) 09/02/2021   GFRNONAA 45 (L) 09/02/2021   GFRAA 48 (L) 03/13/2020   CALCIUM 9.1 09/02/2021   PHOS 4.4 10/27/2020   PROT 6.5 09/02/2021   ALBUMIN 3.3 (L) 09/02/2021   LABGLOB 2.6 07/17/2020   AGRATIO 1.6 07/17/2020   BILITOT 0.8 09/02/2021   ALKPHOS 93 09/02/2021   AST 15 09/02/2021   ALT 19 09/02/2021   ANIONGAP 9 09/02/2021    CBG (last 3)  Recent Labs    09/03/21 0719 09/04/21 0723  GLUCAP 252* 294*       Coagulation Profile: No results for input(s): "INR", "PROTIME" in the  last 168 hours.   Radiology Studies: VAS US CAROTID  Result Date: 09/03/2021 Carotid Arterial Duplex Study Patient Name:  [name]  Date of Exam:   09/03/2021 Medical Rec #: [id number]     Accession #:    [id number] Date of Birth: January 09, 1956     Patient Gender: M Patient Age:   71 years Exam Location:  [facility] Procedure:      VAS US CAROTID Referring Phys: Hosie Poisson --------------------------------------------------------------------------------  Indications:       Syncope. Risk Factors:      Hypertension, hyperlipidemia, Diabetes, no history of                    smoking. Comparison Study:  No previous exam noted. Performing Technologist: Bobetta Lime  Examination Guidelines: A complete evaluation includes B-mode imaging, spectral Doppler, color Doppler, and power Doppler as needed of all accessible portions of each vessel. Bilateral testing is considered an integral part of a complete examination. Limited examinations for reoccurring indications may be performed as noted.  Right Carotid Findings: +----------+--------+--------+--------+------------------+------------------+           PSV cm/sEDV cm/sStenosisPlaque DescriptionComments           +----------+--------+--------+--------+------------------+------------------+ CCA Prox  88      15                                intimal thickening +----------+--------+--------+--------+------------------+------------------+ CCA Distal99      19                                intimal thickening +----------+--------+--------+--------+------------------+------------------+ ICA Prox  61      21              heterogenous                         +----------+--------+--------+--------+------------------+------------------+ ECA       72      19              heterogenous                         +----------+--------+--------+--------+------------------+------------------+  +----------+--------+-------+----------------+-------------------+           PSV cm/sEDV cmsDescribe        Arm Pressure (mmHG) +----------+--------+-------+----------------+-------------------+ [id number]            Multiphasic, WNL                    +----------+--------+-------+----------------+-------------------+ +---------+--------+--------+---------+ VertebralPSV cm/sEDV cm/sAntegrade +---------+--------+--------+---------+  Left Carotid Findings: +----------+--------+--------+--------+------------------+------------------+           PSV cm/sEDV cm/sStenosisPlaque DescriptionComments           +----------+--------+--------+--------+------------------+------------------+ CCA Prox  105     18                                intimal thickening +----------+--------+--------+--------+------------------+------------------+ CCA Distal111     22                                intimal thickening +----------+--------+--------+--------+------------------+------------------+ ICA Prox  66      22              heterogenous                         +----------+--------+--------+--------+------------------+------------------+ ICA Distal72      25                                                   +----------+--------+--------+--------+------------------+------------------+  ECA       107     7               heterogenous                         +----------+--------+--------+--------+------------------+------------------+ +----------+--------+--------+----------------+-------------------+           PSV cm/sEDV cm/sDescribe        Arm Pressure (mmHG) +----------+--------+--------+----------------+-------------------+ [id number]             Multiphasic, WNL                    +----------+--------+--------+----------------+-------------------+ +---------+--------+--+--------+--+---------+ VertebralPSV cm/s59EDV cm/s18Antegrade  +---------+--------+--+--------+--+---------+   Summary: Right Carotid: Velocities in the right ICA are consistent with a 1-39% stenosis. Left Carotid: Velocities in the left ICA are consistent with a 1-39% stenosis. Vertebrals:  Bilateral vertebral arteries demonstrate antegrade flow. Subclavians: Normal flow hemodynamics were seen in bilateral subclavian              arteries. *See table(s) above for measurements and observations.  Electronically signed by Monica Martinez MD on 09/03/2021 at 7:47:42 PM.    Final    EEG adult  Result Date: 09/03/2021 Lora Havens, MD     09/03/2021  5:45 PM Patient Name: [name] MRN: [id number] Epilepsy Attending: Lora Havens Referring Physician/Provider: Hosie Poisson, MD Date: 09/03/2021 Duration: 23.04 mins Patient history: 66yo m with syncope. EEG to evaluate for seizure. Level of alertness: Awake AEDs during EEG study: None Technical aspects: This EEG study was done with scalp electrodes positioned according to the 10-20 International system of electrode placement. Electrical activity was acquired at a sampling rate of '500Hz'$  and reviewed with a high frequency filter of '70Hz'$  and a low frequency filter of '1Hz'$ . EEG data were recorded continuously and digitally stored. Description: The posterior dominant rhythm consists of 9 Hz activity of moderate voltage (25-35 uV) seen predominantly in posterior head regions, symmetric and reactive to eye opening and eye closing.  Hyperventilation and photic stimulation were not performed.   IMPRESSION: This study is within normal limits. No seizures or epileptiform discharges were seen throughout the recording. Priyanka Rosary Lively M.D. [facility] 09/04/2021, 2:56 PM  Available via Epic secure chat 7am-7pm After 7 pm, please refer to night coverage provider listed on amion.

## 2021-09-04 NOTE — Plan of Care (Signed)
  Problem: Activity: Goal: Risk for activity intolerance will decrease Outcome: Progressing   Problem: Nutrition: Goal: Adequate nutrition will be maintained Outcome: Progressing   Problem: Elimination: Goal: Will not experience complications related to bowel motility Outcome: Progressing Goal: Will not experience complications related to urinary retention Outcome: Progressing   Problem: Pain Managment: Goal: General experience of comfort will improve Outcome: Progressing   Problem: Education: Goal: Ability to describe self-care measures that may prevent or decrease complications (Diabetes Survival Skills Education) will improve Outcome: Progressing Goal: Individualized Educational Video(s) Outcome: Progressing   Problem: Coping: Goal: Ability to adjust to condition or change in health will improve Outcome: Progressing   Problem: Fluid Volume: Goal: Ability to maintain a balanced intake and output will improve Outcome: Progressing   Problem: Health Behavior/Discharge Planning: Goal: Ability to identify and utilize available resources and services will improve Outcome: Progressing Goal: Ability to manage health-related needs will improve Outcome: Progressing   Problem: Metabolic: Goal: Ability to maintain appropriate glucose levels will improve Outcome: Progressing   Problem: Nutritional: Goal: Maintenance of adequate nutrition will improve Outcome: Progressing Goal: Progress toward achieving an optimal weight will improve Outcome: Progressing   Problem: Skin Integrity: Goal: Risk for impaired skin integrity will decrease Outcome: Progressing   Problem: Tissue Perfusion: Goal: Adequacy of tissue perfusion will improve Outcome: Progressing

## 2021-09-04 NOTE — Progress Notes (Signed)
Echo attempted. Patient refusing test at this time, stating "I want to talk to the doctor before having test done."

## 2021-09-04 NOTE — Progress Notes (Addendum)
Blood sugar 430. Patient insisted on snack a few hours ago. Dr. Karleen Hampshire was notified and was ordered to just give 9 units of novolog at the moment.    '@1755'$   Patient came out of the room to the nursing station to ask for a cake. Educated the patient regarding A1C and sugar being 430. Patient still requesting cake.  '@1814'$   Patient came out of the room again, requesting cake. Took away his white bread and sauce from his dinner tray to reduce carb intake. Patient agreed to trade for a cake.

## 2021-09-04 NOTE — Progress Notes (Signed)
Physical Therapy Treatment Patient Details Name: [name] MRN: [id number] DOB: 10/17/1955 Today's Date: 09/04/2021   History of Present Illness 66 y.o. male presents to [facility] on 09/01/2021 with dizziness and recent syncopal episodes. CT and MRI unremakrable. EEG negative for seizure   PMH includes DMII, CKD, HLD, HTN.    PT Comments    Patient seen for further assessment of dizziness. Patient denied any sense of vertigo or movement with his dizziness. No nystagmus observed throughout session. Patient reports mild, brief lightheadedness with supine to sit and sit to stand. No further vestibular assessment indicated. Able to ambulate 102f without imbalance or drifting. Patient reports he feels much better than when admitted. Will continue to follow to progress towards mobility goals.     Recommendations for follow up therapy are one component of a multi-disciplinary discharge planning process, led by the attending physician.  Recommendations may be updated based on patient status, additional functional criteria and insurance authorization.  Follow Up Recommendations  No PT follow up     Assistance Recommended at Discharge PRN  Patient can return home with the following Direct supervision/assist for medications management;Direct supervision/assist for financial management;Assist for transportation;Help with stairs or ramp for entrance   Equipment Recommendations  None recommended by PT    Recommendations for Other Services       Precautions / Restrictions Precautions Precautions: Fall Restrictions Weight Bearing Restrictions: No     Mobility  Bed Mobility Overal bed mobility: Needs Assistance Bed Mobility: Supine to Sit, Sit to Supine     Supine to sit: Independent Sit to supine: Independent   General bed mobility comments: very mild lightheadedness with supine to sit (brief); no dizziness or nystagmus with return to supine    Transfers Overall transfer level:  Needs assistance Equipment used: None Transfers: Sit to/from Stand Sit to Stand: Supervision           General transfer comment: supervision for safety only due to reports of mild lightheadedness    Ambulation/Gait Ambulation/Gait assistance: Supervision Gait Distance (Feet): 95 Feet Assistive device: None Gait Pattern/deviations: Step-through pattern, Decreased stance time - right Gait velocity: reduced     General Gait Details: pt with slowed step-through gait, reduced stance time on RLE; denied lightheadedness or sense of movement   Stairs             Wheelchair Mobility    Modified Rankin (Stroke Patients Only)       Balance Overall balance assessment: Needs assistance Sitting-balance support: No upper extremity supported, Feet supported Sitting balance-Leahy Scale: Good     Standing balance support: No upper extremity supported, During functional activity Standing balance-Leahy Scale: Good Standing balance comment: able to perform pericare in standing                            Cognition Arousal/Alertness: Awake/alert Behavior During Therapy: Flat affect Overall Cognitive Status: No family/caregiver present to determine baseline cognitive functioning                                 General Comments: slowed processing        Exercises      General Comments        Pertinent Vitals/Pain Pain Assessment Pain Assessment: No/denies pain    Home Living  Prior Function            PT Goals (current goals can now be found in the care plan section) Acute Rehab PT Goals Patient Stated Goal: to reduce feelings of lightheadedness and reduce falls risk PT Goal Formulation: With patient Time For Goal Achievement: 09/16/21 Potential to Achieve Goals: Fair Progress towards PT goals: Progressing toward goals    Frequency    Min 3X/week      PT Plan Discharge plan needs to be  updated    Co-evaluation              AM-PAC PT "6 Clicks" Mobility   Outcome Measure  Help needed turning from your back to your side while in a flat bed without using bedrails?: None Help needed moving from lying on your back to sitting on the side of a flat bed without using bedrails?: None Help needed moving to and from a bed to a chair (including a wheelchair)?: A Little Help needed standing up from a chair using your arms (e.g., wheelchair or bedside chair)?: A Little Help needed to walk in hospital room?: A Little Help needed climbing 3-5 steps with a railing? : A Little 6 Click Score: 20    End of Session   Activity Tolerance: Patient limited by fatigue Patient left: in bed;with call bell/phone within reach Nurse Communication: Mobility status PT Visit Diagnosis: Other abnormalities of gait and mobility (R26.89);Dizziness and giddiness (R42)     Time: 5956-3875 PT Time Calculation (min) (ACUTE ONLY): 13 min  Charges:  $Gait Training: 8-22 mins                      [place]  Office [phone]    Rexanne Mano 09/04/2021, 9:38 AM

## 2021-09-05 ENCOUNTER — Observation Stay (HOSPITAL_COMMUNITY): Payer: Medicare Other

## 2021-09-05 DIAGNOSIS — M25551 Pain in right hip: Secondary | ICD-10-CM | POA: Diagnosis not present

## 2021-09-05 DIAGNOSIS — I6782 Cerebral ischemia: Secondary | ICD-10-CM | POA: Diagnosis not present

## 2021-09-05 DIAGNOSIS — N1831 Chronic kidney disease, stage 3a: Secondary | ICD-10-CM | POA: Diagnosis not present

## 2021-09-05 DIAGNOSIS — M25552 Pain in left hip: Secondary | ICD-10-CM | POA: Diagnosis not present

## 2021-09-05 DIAGNOSIS — G319 Degenerative disease of nervous system, unspecified: Secondary | ICD-10-CM | POA: Diagnosis not present

## 2021-09-05 DIAGNOSIS — R55 Syncope and collapse: Secondary | ICD-10-CM | POA: Diagnosis not present

## 2021-09-05 DIAGNOSIS — R42 Dizziness and giddiness: Secondary | ICD-10-CM | POA: Diagnosis not present

## 2021-09-05 DIAGNOSIS — I10 Essential (primary) hypertension: Secondary | ICD-10-CM | POA: Diagnosis not present

## 2021-09-05 LAB — BASIC METABOLIC PANEL
Anion gap: 10 (ref 5–15)
BUN: 43 mg/dL — ABNORMAL HIGH (ref 8–23)
CO2: 27 mmol/L (ref 22–32)
Calcium: 9.8 mg/dL (ref 8.9–10.3)
Chloride: 101 mmol/L (ref 98–111)
Creatinine, Ser: 1.78 mg/dL — ABNORMAL HIGH (ref 0.61–1.24)
GFR, Estimated: 41 mL/min — ABNORMAL LOW (ref >60)
Glucose, Bld: 256 mg/dL — ABNORMAL HIGH (ref 70–99)
Potassium: 4.5 mmol/L (ref 3.5–5.1)
Sodium: 138 mmol/L (ref 135–145)

## 2021-09-05 LAB — CBC WITH DIFFERENTIAL/PLATELET
Abs Immature Granulocytes: 0.04 10*3/uL (ref 0.00–0.07)
Basophils Absolute: 0.1 10*3/uL (ref 0.0–0.1)
Basophils Relative: 1 %
Eosinophils Absolute: 0.2 10*3/uL (ref 0.0–0.5)
Eosinophils Relative: 3 %
HCT: 54.4 % — ABNORMAL HIGH (ref 39.0–52.0)
Hemoglobin: 18 g/dL — ABNORMAL HIGH (ref 13.0–17.0)
Immature Granulocytes: 1 %
Lymphocytes Relative: 24 %
Lymphs Abs: 1.6 10*3/uL (ref 0.7–4.0)
MCH: 29.1 pg (ref 26.0–34.0)
MCHC: 33.1 g/dL (ref 30.0–36.0)
MCV: 88 fL (ref 80.0–100.0)
Monocytes Absolute: 0.5 10*3/uL (ref 0.1–1.0)
Monocytes Relative: 8 %
Neutro Abs: 4.3 10*3/uL (ref 1.7–7.7)
Neutrophils Relative %: 63 %
Platelets: 201 10*3/uL (ref 150–400)
RBC: 6.18 MIL/uL — ABNORMAL HIGH (ref 4.22–5.81)
RDW: 12.7 % (ref 11.5–15.5)
WBC: 6.7 10*3/uL (ref 4.0–10.5)
nRBC: 0 % (ref 0.0–0.2)

## 2021-09-05 LAB — GLUCOSE, CAPILLARY
Glucose-Capillary: 234 mg/dL — ABNORMAL HIGH (ref 70–99)
Glucose-Capillary: 237 mg/dL — ABNORMAL HIGH (ref 70–99)
Glucose-Capillary: 262 mg/dL — ABNORMAL HIGH (ref 70–99)
Glucose-Capillary: 294 mg/dL — ABNORMAL HIGH (ref 70–99)

## 2021-09-05 MED ORDER — INSULIN GLARGINE-YFGN 100 UNIT/ML ~~LOC~~ SOLN
10.0000 [IU] | Freq: Every day | SUBCUTANEOUS | Status: DC
  Administered 2021-09-05: 10 [IU] via SUBCUTANEOUS
  Filled 2021-09-05 (×2): qty 0.1

## 2021-09-05 NOTE — Progress Notes (Signed)
  Mobility Specialist Criteria Algorithm Info.   09/05/21 1040  Mobility  Activity Dangled on edge of bed;Refused mobility  Range of Motion/Exercises Active;All extremities  Level of Assistance Standby assist, set-up cues, supervision of patient - no hands on  Assistive Device None   Patient received in supine initially agreeable to participate in mobility. Got to EOB with supervision but upon sitting pt became emotional and tearful. Deferred further mobility for reasons that are unclear to writer, stated that he was depressed and couldn't do it. Offered my sympathy, understanding, and encouraging words. Will f/u as time permits.   09/05/2021 10:40AM  [name], [place], [place]  KPTWS:[phone] Office: [phone]

## 2021-09-05 NOTE — Progress Notes (Addendum)
Inpatient Diabetes Program Recommendations  AACE/ADA: New Consensus Statement on Inpatient Glycemic Control (2015)  Target Ranges:  Prepandial:   less than 140 mg/dL      Peak postprandial:   less than 180 mg/dL (1-2 hours)      Critically ill patients:  140 - 180 mg/dL   Lab Results  Component Value Date   GLUCAP 237 (H) 09/05/2021   HGBA1C 11.0 (H) 09/04/2021    Review of Glycemic Control  Latest Reference Range & Units 09/04/21 07:23 09/04/21 17:34 09/04/21 20:53 09/05/21 07:19 09/05/21 12:06  Glucose-Capillary 70 - 99 mg/dL 294 (H) 430 (H) 212 (H) 234 (H) 237 (H)  (H): Data is abnormally high  Diabetes history: DM 2 Outpatient Diabetes medications:  Rybelsus 14 mg daily Current orders for Inpatient glycemic control:  Novolog sensitive tid with meals and HS  Inpatient Diabetes Program Recommendations:    Please consider adding Semglee 10 units daily.  Note elevated A1C.  Unclear if patient was taking DM medications prior to admit?  May need insulin at home?  Will follow.   Thanks,  Adah Perl, RN, BC-ADM Inpatient Diabetes Coordinator Pager [phone]  (8a-5p)  Addendum 1415- attempted to see patient.  He is currently sleeping soundly.  Will not wake patient at this time. Per RN patient is impulsive and requests sweets and cakes often.  Needs safe discharge plan as well as DM management at home.  Will follow.

## 2021-09-05 NOTE — Progress Notes (Signed)
RN was reported that the patient was found down on the floor, patient reports that he hit his hip, complaining 5/10 pain and slightly banged his head on the wall. Denies headache, complaining of lightheadedness. Dr. Venia Minks was notified. She will be by to assess the patient. Patient still refusing bedalarm. Put the patient back in bed and turned on bed alarm.

## 2021-09-05 NOTE — Consult Note (Addendum)
Cardiology Consultation:   Patient ID: [name] MRN: [id number]; DOB: December 31, 1955  Admit date: 09/01/2021 Date of Consult: 09/05/2021  PCP:  Merryl Hacker, No   [facility] Providers Cardiologist:  Irish Lack   History of Present Illness:   [name] is a 66 yo male with history of HTN, depression, anxiety, depression, anemia, postpolio syndrome, DM, CKD stage 3, hyperlipidemia, prostate cancer and GERD admitted with dizziness. CT head showed no acute abnormality but demonstrated progressive atrophy and white matter disease consistent with chronic microvascular disease.  MRI of the brain showed no acute abnormality. Orthostatic vital signs negative. Echo with normal LV function. No valve issues. Telemetry without any arrhythmias. Carotid artery dopplers with minimal plaque. He tells me that he feels well overall. No chest pain or dyspnea.    Past Medical History:  Diagnosis Date   Acute kidney injury superimposed on chronic kidney disease ([place]) 02/10/2020   Acute on chronic kidney failure (HCC) 09/28/2019   Closed nondisplaced spiral fracture of shaft of right tibia    Community acquired pneumonia 03/19/8784   Complicated urinary tract infection 09/06/2016   Depression    Diabetes mellitus without complication (HCC)    GERD (gastroesophageal reflux disease)    Gout    right foot   Hemorrhoids    Hypertension    Hypertension    Macular degeneration    Microcytic anemia    PNA (pneumonia) 03/26/2021   Prostate cancer ([place]) 08/2009   Rectal bleeding 07/31/2016   Tubular adenoma     Past Surgical History:  Procedure Laterality Date   CYSTOSCOPY WITH RETROGRADE PYELOGRAM, URETEROSCOPY AND STENT PLACEMENT Right 07/31/2016   Procedure: CYSTOSCOPY WITH RIGHT  RETROGRADE PYELOGRAM, URETEROSCOPY;  Surgeon: [name], MD;  Location: [facility];  Service: Urology;  Laterality: Right;   INGUINAL HERNIA REPAIR     Left ankle joint fusion  1981   PROSTATE BIOPSY     x 2   URETERAL REIMPLANTION   07/31/2016   Procedure: URETERAL REIMPLANT, right boari flap, right psoas hitch;  Surgeon: [name], MD;  Location: [facility];  Service: Urology;;     Home Medications:  Prior to Admission medications   Medication Sig Start Date End Date Taking? Authorizing Provider  alfuzosin (UROXATRAL) 10 MG 24 hr tablet Take 1 tablet (10 mg total) by mouth daily with breakfast. 03/30/21  Yes Ghimire, Henreitta Leber, MD  aspirin EC 81 MG tablet Take 81 mg by mouth daily with breakfast.    Yes [provider]  Multiple Vitamin (MULTIVITAMIN WITH MINERALS) TABS tablet Take 1 tablet by mouth daily.   Yes [provider]  olmesartan-hydrochlorothiazide (BENICAR HCT) 40-25 MG tablet Take 1 tablet by mouth daily. 06/25/21  Yes [provider]  Semaglutide (RYBELSUS) 14 MG TABS Take 1 tablet by mouth daily. Take 30 minutes before breakfast 12/06/20  Yes [name], [name]  sildenafil (REVATIO) 20 MG tablet Take 1 tablet (20 mg total) by mouth daily as needed. Patient taking differently: Take 20 mg by mouth daily as needed (erectile dysfunction). 03/30/21  Yes Ghimire, Henreitta Leber, MD  Tetrahydrozoline HCl (VISINE OP) Place 1 drop into both eyes daily as needed (itching/irritation).   Yes [provider]  traZODone (DESYREL) 50 MG tablet Take 1 tablet (50 mg total) by mouth at bedtime as needed for sleep. 03/30/21  Yes Ghimire, Henreitta Leber, MD  acetaminophen (TYLENOL) 325 MG tablet Take 2 tablets (650 mg total) by mouth every 6 (six) hours as needed for  mild pain (or Fever >/= 101). Patient not taking: Reported on 09/02/2021 02/12/20   Domenic Polite, MD  amLODipine (NORVASC) 5 MG tablet Take 1 tablet (5 mg total) by mouth daily. Patient not taking: Reported on 09/02/2021 01/07/21   Regalado, Jerald Kief A, MD  ARIPiprazole (ABILIFY) 2 MG tablet Take 1 tablet (2 mg total) by mouth daily. Patient not taking: Reported on 09/02/2021 03/30/21   Jonetta Osgood, MD  atorvastatin (LIPITOR) 40 MG  tablet Take 1 tablet (40 mg total) by mouth daily. Patient not taking: Reported on 09/02/2021 03/30/21   Jonetta Osgood, MD  dicyclomine (BENTYL) 20 MG tablet Take 1 tablet (20 mg total) by mouth 2 (two) times daily. Patient not taking: Reported on 09/02/2021 04/09/21   Deno Etienne, DO  finasteride (PROSCAR) 5 MG tablet Take 1 tablet (5 mg total) by mouth daily. Patient not taking: Reported on 09/02/2021 03/30/21   Jonetta Osgood, MD  ondansetron (ZOFRAN-ODT) 4 MG disintegrating tablet '4mg'$  ODT q4 hours prn nausea/vomit Patient not taking: Reported on 09/02/2021 04/09/21   Deno Etienne, DO    Inpatient Medications: Scheduled Meds:  aspirin EC  81 mg Oral Q breakfast   atorvastatin  40 mg Oral Daily   enoxaparin (LOVENOX) injection  40 mg Subcutaneous Daily   finasteride  5 mg Oral Daily   insulin aspart  0-5 Units Subcutaneous QHS   insulin aspart  0-9 Units Subcutaneous TID WC   insulin glargine-yfgn  10 Units Subcutaneous Daily   sodium chloride flush  3 mL Intravenous Q12H   Continuous Infusions:  PRN Meds: acetaminophen **OR** acetaminophen, polyethylene glycol, tetrahydrozoline, traZODone  Allergies:    Allergies  Allergen Reactions   Haldol [Haloperidol] Other (See Comments)    Per family cause life threatening complications to patient Per Pt, it causes "complications with my mouth"    Social History:   Social History   Socioeconomic History   Marital status: Divorced    Spouse name: Not on file   Number of children: 0   Years of education: Not on file   Highest education level: Not on file  Occupational History   Occupation: retired  Tobacco Use   Smoking status: Never   Smokeless tobacco: Never  Vaping Use   Vaping Use: Never used  Substance and Sexual Activity   Alcohol use: Not Currently   Drug use: No   Sexual activity: Yes  Other Topics Concern   Not on file  Social History Narrative   Not on file   Social Determinants of Health   Financial  Resource Strain: [place]  (12/10/2019)   Overall Financial Resource Strain (CARDIA)    Difficulty of Paying Living Expenses: Not hard at all  Food Insecurity: No Food Insecurity (08/26/2019)   Hunger Vital Sign    Worried About Running Out of Food in the Last Year: Never true    [place] in the Last Year: Never true  Transportation Needs: No Transportation Needs (08/26/2019)   PRAPARE - Hydrologist (Medical): No    Lack of Transportation (Non-Medical): No  Physical Activity: Insufficiently Active (08/26/2019)   Exercise Vital Sign    Days of Exercise per Week: 3 days    Minutes of Exercise per Session: 10 min  Stress: No Stress Concern Present (08/26/2019)   [place]    Feeling of Stress : Only a little  Social Connections: Not on file  Intimate Partner Violence: Not At Risk (08/25/2018)   Humiliation, Afraid, Rape, and Kick questionnaire    Fear of Current or Ex-Partner: No    Emotionally Abused: No    Physically Abused: No    Sexually Abused: No    Family History:    Family History  Problem Relation Age of Onset   Lung cancer Mother        Deceased   Pancreatic cancer Father    Heart disease Father        Deceased   Throat cancer Brother    Lung cancer Maternal Uncle        nephew   Diabetes Other      ROS:  Please see the history of present illness.  All other ROS reviewed and negative.     Physical Exam/Data:   Vitals:   09/05/21 0457 09/05/21 0500 09/05/21 0832 09/05/21 1631  BP: 120/69  (!) 139/96 (!) 138/95  Pulse: 61  75 76  Resp: '18  17 18  '$ Temp: 98 F (36.7 C)  98 F (36.7 C) 98.2 F (36.8 C)  TempSrc: Oral  Oral Oral  SpO2: 97%  99% 99%  Weight:  74 kg    Height:        Intake/Output Summary (Last 24 hours) at 09/05/2021 1718 Last data filed at 09/05/2021 1600 Gross per 24 hour  Intake 763 ml  Output 1300 ml  Net -537 ml      09/05/2021     5:00 AM 09/04/2021    5:00 AM 09/02/2021    4:00 PM  Last 3 Weights  Weight (lbs) 163 lb 2.3 oz 163 lb 165 lb 1.6 oz  Weight (kg) 74 kg 73.936 kg 74.889 kg     Body mass index is 26.33 kg/m.  General:  Well nourished, well developed, in no acute distress HEENT: normal Neck: no JVD Vascular: No carotid bruits; Distal pulses 2+ bilaterally Cardiac:  normal S1, S2; RRR; no murmur  Lungs:  clear to auscultation bilaterally, no wheezing, rhonchi or rales  Abd: soft, nontender, no hepatomegaly  Ext: no edema Musculoskeletal:  No deformities, BUE and BLE strength normal and equal Skin: warm and dry  Neuro:  CNs 2-12 intact, no focal abnormalities noted Psych:  Normal affect   EKG:  The EKG was personally reviewed and demonstrates:  sinus, low voltage Telemetry:  Telemetry was personally reviewed and demonstrates:  sinus  Relevant CV Studies:   Laboratory Data:  High Sensitivity Troponin:   Recent Labs  Lab 09/01/21 1808 09/01/21 2058  TROPONINIHS 5 7     Chemistry Recent Labs  Lab 09/01/21 1657 09/02/21 0357 09/05/21 0803  NA 137 136 138  K 4.0 3.6 4.5  CL 98 103 101  CO2 '23 24 27  '$ GLUCOSE 246* 256* 256*  BUN 28* 27* 43*  CREATININE 1.84* 1.66* 1.78*  CALCIUM 9.6 9.1 9.8  MG  --  1.9  --   GFRNONAA 40* 45* 41*  ANIONGAP 16* 9 10    Recent Labs  Lab 09/01/21 1657 09/02/21 0357  PROT 7.4 6.5  ALBUMIN 4.0 3.3*  AST 18 15  ALT 23 19  ALKPHOS 114 93  BILITOT 0.8 0.8   Lipids No results for input(s): "CHOL", "TRIG", "HDL", "LABVLDL", "LDLCALC", "CHOLHDL" in the last 168 hours.  Hematology Recent Labs  Lab 09/01/21 1657 09/02/21 0357 09/05/21 0803  WBC 8.4 7.6 6.7  RBC 6.09* 5.99* 6.18*  HGB 18.1* 17.6* 18.0*  HCT 53.6* 53.6* 54.4*  MCV 88.0 89.5 88.0  MCH 29.7 29.4 29.1  MCHC 33.8 32.8 33.1  RDW 12.7 12.8 12.7  PLT 235 194 201   Thyroid No results for input(s): "TSH", "FREET4" in the last 168 hours.  BNPNo results for input(s): "BNP", "PROBNP" in  the last 168 hours.  DDimer No results for input(s): "DDIMER" in the last 168 hours.   Radiology/Studies:  CT HEAD WO CONTRAST (5MM)  Result Date: 09/05/2021 CLINICAL DATA:  Dizziness, persistent/recurrent, cardiac or vascular cause suspected. Syncope and collapse. EXAM: CT HEAD WITHOUT CONTRAST TECHNIQUE: Contiguous axial images were obtained from the base of the skull through the vertex without intravenous contrast. RADIATION DOSE REDUCTION: This exam was performed according to the departmental dose-optimization program which includes automated exposure control, adjustment of the mA and/or kV according to patient size and/or use of iterative reconstruction technique. COMPARISON:  Head CT and MRI 09/01/2021 FINDINGS: Brain: There is no evidence of an acute infarct, intracranial hemorrhage, mass, midline shift, or extra-axial fluid collection. Patchy and confluent hypodensities in the cerebral white matter bilaterally are unchanged and nonspecific but compatible with extensive chronic small vessel ischemic disease. Age advanced cerebral atrophy is again noted with asymmetric dilatation of the occipital horn of the left lateral ventricle Vascular: Calcified atherosclerosis at the skull base. No hyperdense vessel. Skull: No fracture or suspicious osseous lesion. Sinuses/Orbits: Visualized paranasal sinuses and mastoid air cells are clear. Unremarkable orbits. Other: None. IMPRESSION: 1. No evidence of acute intracranial abnormality. 2. Age advanced chronic small vessel ischemic disease and cerebral atrophy. Electronically Signed   By: Logan Bores M.D.   On: 09/05/2021 11:28   DG HIP UNILAT WITH PELVIS 2-3 VIEWS LEFT  Result Date: 09/05/2021 CLINICAL DATA:  Provided history: Fall, left greater than right hip pain. EXAM: DG HIP (WITH OR WITHOUT PELVIS) 2-3V LEFT COMPARISON:  Same day radiographs of the right hip 09/05/2021. Radiographs of the left hip 07/31/2020. FINDINGS: There is normal bony alignment. No  evidence of acute osseous or articular abnormality. The joint spaces are maintained. IMPRESSION: No evidence of acute osseous or articular abnormality. Electronically Signed   By: Kellie Simmering D.O.   On: 09/05/2021 10:15   DG HIP UNILAT WITH PELVIS 2-3 VIEWS RIGHT  Result Date: 09/05/2021 CLINICAL DATA:  Provided history: Fall. Pain in left greater than right hips. EXAM: DG HIP (WITH OR WITHOUT PELVIS) 2-3V RIGHT COMPARISON:  Same day radiographs of the left hip 09/05/2021. Radiographs the left hip 07/31/2020. FINDINGS: There is normal bony alignment. No evidence of acute osseous or articular abnormality. Mild right femoroacetabular joint osteoarthrosis. IMPRESSION: No evidence of acute osseous or articular abnormality. Electronically Signed   By: Kellie Simmering D.O.   On: 09/05/2021 10:13   ECHOCARDIOGRAM COMPLETE  Result Date: 09/04/2021    ECHOCARDIOGRAM REPORT   Patient Name:   [name] Date of Exam: 09/04/2021 Medical Rec #:  [id number]    Height:       66.0 in Accession #:    [id number]   Weight:       163.0 lb Date of Birth:  [birth year]    BSA:          1.833 m Patient Age:    58 years     BP:           117/72 mmHg Patient Gender: M            HR:           68 bpm. Exam Location:  Inpatient  Procedure: 2D Echo, Cardiac Doppler and Color Doppler Indications:    Syncope  History:        Patient has prior history of Echocardiogram examinations, most                 recent 01/05/2021. Risk Factors:Hypertension, Dyslipidemia and                 Diabetes. CKD.  Sonographer:    Clayton Lefort RDCS (AE) Referring Phys: [id number] [place]  1. Left ventricular ejection fraction, by estimation, is 60 to 65%. The left ventricle has normal function. The left ventricle has no regional wall motion abnormalities. There is mild concentric left ventricular hypertrophy. Left ventricular diastolic parameters are consistent with Grade I diastolic dysfunction (impaired relaxation).  2. Right ventricular  systolic function is normal. The right ventricular size is normal.  3. The mitral valve is abnormal. No evidence of mitral valve regurgitation. No evidence of mitral stenosis.  4. The aortic valve was not well visualized. Aortic valve regurgitation is not visualized. Aortic valve sclerosis is present, with no evidence of aortic valve stenosis.  5. The inferior vena cava is normal in size with greater than 50% respiratory variability, suggesting right atrial pressure of 3 mmHg. Comparison(s): No significant change from prior study. FINDINGS  Left Ventricle: Left ventricular ejection fraction, by estimation, is 60 to 65%. The left ventricle has normal function. The left ventricle has no regional wall motion abnormalities. The left ventricular internal cavity size was normal in size. There is  mild concentric left ventricular hypertrophy. Left ventricular diastolic parameters are consistent with Grade I diastolic dysfunction (impaired relaxation). Right Ventricle: The right ventricular size is normal. No increase in right ventricular wall thickness. Right ventricular systolic function is normal. Left Atrium: Left atrial size was normal in size. Right Atrium: Right atrial size was normal in size. Pericardium: Trivial pericardial effusion is present. Mitral Valve: The mitral valve is abnormal. No evidence of mitral valve regurgitation. No evidence of mitral valve stenosis. Tricuspid Valve: The tricuspid valve is normal in structure. Tricuspid valve regurgitation is not demonstrated. No evidence of tricuspid stenosis. Aortic Valve: The aortic valve was not well visualized. Aortic valve regurgitation is not visualized. Aortic valve sclerosis is present, with no evidence of aortic valve stenosis. Aortic valve mean gradient measures 4.0 mmHg. Aortic valve peak gradient measures 6.6 mmHg. Aortic valve area, by VTI measures 3.37 cm. Pulmonic Valve: The pulmonic valve was not well visualized. Pulmonic valve regurgitation is  not visualized. No evidence of pulmonic stenosis. Aorta: The aortic root and ascending aorta are structurally normal, with no evidence of dilitation. Venous: The inferior vena cava is normal in size with greater than 50% respiratory variability, suggesting right atrial pressure of 3 mmHg. IAS/Shunts: No atrial level shunt detected by color flow Doppler.  LEFT VENTRICLE PLAX 2D LVIDd:         4.40 cm   Diastology LVIDs:         2.90 cm   LV e' medial:    6.09 cm/s LV PW:         1.20 cm   LV E/e' medial:  11.0 LV IVS:        1.00 cm   LV e' lateral:   7.94 cm/s LVOT diam:     2.10 cm   LV E/e' lateral: 8.4 LV SV:         89 LV SV Index:   49 LVOT Area:  3.46 cm  RIGHT VENTRICLE             IVC RV S prime:     16.90 cm/s  IVC diam: 1.40 cm TAPSE (M-mode): 1.7 cm LEFT ATRIUM           Index        RIGHT ATRIUM           Index LA diam:      3.50 cm 1.91 cm/m   RA Area:     13.90 cm LA Vol (A2C): 28.3 ml 15.44 ml/m  RA Volume:   34.20 ml  18.66 ml/m LA Vol (A4C): 42.7 ml 23.29 ml/m  AORTIC VALVE AV Area (Vmax):    3.65 cm AV Area (Vmean):   3.54 cm AV Area (VTI):     3.37 cm AV Vmax:           128.00 cm/s AV Vmean:          98.700 cm/s AV VTI:            0.265 m AV Peak Grad:      6.6 mmHg AV Mean Grad:      4.0 mmHg LVOT Vmax:         135.00 cm/s LVOT Vmean:        101.000 cm/s LVOT VTI:          0.258 m LVOT/AV VTI ratio: 0.97  AORTA Ao Root diam: 3.30 cm Ao Asc diam:  3.20 cm MITRAL VALVE MV Area (PHT): 2.02 cm    SHUNTS MV Decel Time: 376 msec    Systemic VTI:  0.26 m MV E velocity: 66.80 cm/s  Systemic Diam: 2.10 cm MV A velocity: 82.30 cm/s MV E/A ratio:  0.81 Rudean Haskell MD Electronically signed by Rudean Haskell MD Signature Date/Time: 09/04/2021/5:23:25 PM    Final    VAS US CAROTID  Result Date: 09/03/2021 Carotid Arterial Duplex Study Patient Name:  [name]  Date of Exam:   09/03/2021 Medical Rec #: [id number]     Accession #:    [id number] Date of Birth: 12-26-1955     Patient  Gender: M Patient Age:   40 years Exam Location:  [facility] Procedure:      VAS US CAROTID Referring Phys: Hosie Poisson --------------------------------------------------------------------------------  Indications:       Syncope. Risk Factors:      Hypertension, hyperlipidemia, Diabetes, no history of                    smoking. Comparison Study:  No previous exam noted. Performing Technologist: Bobetta Lime  Examination Guidelines: A complete evaluation includes B-mode imaging, spectral Doppler, color Doppler, and power Doppler as needed of all accessible portions of each vessel. Bilateral testing is considered an integral part of a complete examination. Limited examinations for reoccurring indications may be performed as noted.  Right Carotid Findings: +----------+--------+--------+--------+------------------+------------------+           PSV cm/sEDV cm/sStenosisPlaque DescriptionComments           +----------+--------+--------+--------+------------------+------------------+ CCA Prox  88      15                                intimal thickening +----------+--------+--------+--------+------------------+------------------+ CCA Distal99      19  intimal thickening +----------+--------+--------+--------+------------------+------------------+ ICA Prox  61      21              heterogenous                         +----------+--------+--------+--------+------------------+------------------+ ECA       72      19              heterogenous                         +----------+--------+--------+--------+------------------+------------------+ +----------+--------+-------+----------------+-------------------+           PSV cm/sEDV cmsDescribe        Arm Pressure (mmHG) +----------+--------+-------+----------------+-------------------+ Subclavian114            Multiphasic, WNL                     +----------+--------+-------+----------------+-------------------+ +---------+--------+--------+---------+ VertebralPSV cm/sEDV cm/sAntegrade +---------+--------+--------+---------+  Left Carotid Findings: +----------+--------+--------+--------+------------------+------------------+           PSV cm/sEDV cm/sStenosisPlaque DescriptionComments           +----------+--------+--------+--------+------------------+------------------+ CCA Prox  105     18                                intimal thickening +----------+--------+--------+--------+------------------+------------------+ CCA Distal111     22                                intimal thickening +----------+--------+--------+--------+------------------+------------------+ ICA Prox  66      22              heterogenous                         +----------+--------+--------+--------+------------------+------------------+ ICA Distal72      25                                                   +----------+--------+--------+--------+------------------+------------------+ ECA       107     7               heterogenous                         +----------+--------+--------+--------+------------------+------------------+ +----------+--------+--------+----------------+-------------------+           PSV cm/sEDV cm/sDescribe        Arm Pressure (mmHG) +----------+--------+--------+----------------+-------------------+ [id number]             Multiphasic, WNL                    +----------+--------+--------+----------------+-------------------+ +---------+--------+--+--------+--+---------+ VertebralPSV cm/s59EDV cm/s18Antegrade +---------+--------+--+--------+--+---------+   Summary: Right Carotid: Velocities in the right ICA are consistent with a 1-39% stenosis. Left Carotid: Velocities in the left ICA are consistent with a 1-39% stenosis. Vertebrals:  Bilateral vertebral arteries demonstrate antegrade flow.  Subclavians: Normal flow hemodynamics were seen in bilateral subclavian              arteries. *See table(s) above for measurements and observations.  Electronically signed by Monica Martinez MD on 09/03/2021 at 89:47:42 PM.    Final    EEG adult  Result Date: 09/03/2021 Lora Havens, MD     09/03/2021  5:45 PM Patient Name: [name] MRN: [id number] Epilepsy Attending: Lora Havens Referring Physician/Provider: Hosie Poisson, MD Date: 09/03/2021 Duration: 23.04 mins Patient history: 66yo m with syncope. EEG to evaluate for seizure. Level of alertness: Awake AEDs during EEG study: None Technical aspects: This EEG study was done with scalp electrodes positioned according to the 10-20 International system of electrode placement. Electrical activity was acquired at a sampling rate of '500Hz'$  and reviewed with a high frequency filter of '70Hz'$  and a low frequency filter of '1Hz'$ . EEG data were recorded continuously and digitally stored. Description: The posterior dominant rhythm consists of 9 Hz activity of moderate voltage (25-35 uV) seen predominantly in posterior head regions, symmetric and reactive to eye opening and eye closing.  Hyperventilation and photic stimulation were not performed.   IMPRESSION: This study is within normal limits. No seizures or epileptiform discharges were seen throughout the recording. Lora Havens   MR BRAIN WO CONTRAST  Result Date: 09/01/2021 CLINICAL DATA:  Initial evaluation for acute dizziness. EXAM: MRI HEAD WITHOUT CONTRAST TECHNIQUE: Multiplanar, multiecho pulse sequences of the brain and surrounding structures were obtained without intravenous contrast. COMPARISON:  Prior CT from earlier the same day. FINDINGS: Brain: Generalized age-related cerebral atrophy. Patchy and confluent T2/FLAIR hyperintensity involving the periventricular deep white matter both cerebral hemispheres, most consistent with chronic small vessel ischemic disease, moderately advanced in  nature. No evidence for acute or subacute infarct. Gray-white matter differentiation maintained. No areas of chronic cortical infarction. No acute or chronic intracranial blood products. No mass lesion, midline shift or mass effect. Mild ventricular prominence related global parenchymal volume loss of hydrocephalus. No extra-axial fluid collection. Pituitary gland and suprasellar region normal. Vascular: Major intracranial vascular flow voids are maintained. Skull and upper cervical spine: Craniocervical junction normal. Bone marrow signal intensity within normal limits. No scalp soft tissue abnormality 5 Sinuses/Orbits: Globes and orbital soft tissues within normal limits. Paranasal sinuses are largely clear. No mastoid effusion. Other: None. IMPRESSION: 1. No acute intracranial abnormality. 2. Moderately advanced chronic microvascular ischemic disease for age. Electronically Signed   By: Jeannine Boga M.D.   On: 09/01/2021 23:06   DG Chest Portable 1 View  Result Date: 09/01/2021 CLINICAL DATA:  Evaluate for pulmonary edema. EXAM: PORTABLE CHEST 1 VIEW COMPARISON:  Chest radiograph dated 05/30/2021. FINDINGS: No focal consolidation, pleural effusion, pneumothorax. The cardiac silhouette is within limits. No acute osseous pathology. IMPRESSION: No active disease. Electronically Signed   By: Anner Crete M.D.   On: 09/01/2021 19:36   CT HEAD WO CONTRAST (5MM)  Result Date: 09/01/2021 CLINICAL DATA:  Dizziness, persistent/recurrent, cardiac or vascular cause suspected. EXAM: CT HEAD WITHOUT CONTRAST TECHNIQUE: Contiguous axial images were obtained from the base of the skull through the vertex without intravenous contrast. RADIATION DOSE REDUCTION: This exam was performed according to the departmental dose-optimization program which includes automated exposure control, adjustment of the mA and/or kV according to patient size and/or use of iterative reconstruction technique. COMPARISON:  CT head  without contrast 09/28/2019 FINDINGS: Brain: Moderate atrophy and white matter disease has progressed. No acute infarct, hemorrhage, or mass lesion is present. The ventricles are proportionate to the degree of atrophy. No significant extraaxial fluid collection is present. The brainstem and cerebellum are within normal limits. Vascular: Atherosclerotic calcifications are present within the cavernous internal carotid arteries. Additional calcifications are present at the dural margin of the vertebral arteries. No hyperdense vessel  is present. Skull: Calvarium is intact. No focal lytic or blastic lesions are present. No significant extracranial soft tissue lesion is present. Sinuses/Orbits: The paranasal sinuses and mastoid air cells are clear. The globes and orbits are within normal limits. IMPRESSION: 1. Progressive atrophy and white matter disease. This likely reflects the sequela of chronic microvascular ischemia. 2. No acute intracranial abnormality. Electronically Signed   By: San Morelle M.D.   On: 09/01/2021 18:10     Assessment and Plan:   Dizziness/near syncope: No evidence of arrhythmias thus far on telemetry. Normal LV function by echo. Minimal carotid artery disease. I suspect that his dizziness is not cardiac related. Possibly vertigo. We can help arrange a 30 day cardiac monitor at the time of discharge if he agrees to this.    For questions or updates, please contact [place] Please consult [email or web address] for contact info under    Signed, Lauree Chandler, MD  09/05/2021 5:18 PM

## 2021-09-05 NOTE — Progress Notes (Signed)
[facility]                                                                               [name], is a 66 y.o. male, DOB - 08-Jun-1955, [place] date - 09/01/2021    Outpatient Primary MD for the patient is Pcp, No  LOS - 0  days    Brief summary   [name] is a 66 y.o. male with medical history significant of anemia, history of postpolio syndrome, hypertension, depression, anxiety, insomnia, diabetes, CKD 3, hyperlipidemia, prostate cancer, GERD, macular degeneration presenting with dizziness. .  CT head showed no acute abnormality but demonstrate progressive atrophy and white matter disease consistent with chronic microvascular disease.  MRI of the brain showed no acute abnormality,.   He was admitted for evaluation of syncope and dizziness. So far work up has been negative.  RN reported that patient refused bed alarm earlier this am, got up and felt dizzy, lightheaded and fell, hitting his head and hip.    Assessment & Plan    Assessment and Plan:  Intermittent dizziness and Syncope:  Differential include autonomic dysregulation, neuropathy from DM vs neurological etiology vs arrhythmias.  CT head showed no acute abnormality but demonstrate progressive atrophy and white matter disease consistent with chronic microvascular disease.  MRI of the brain showed no acute abnormality,. Orthostatic vital signs are negative.  Echocardiogram showed preserved LVef and grade 1 diastolic dysfunction. No regional wall abnormalities.  Carotid duplex is unremarkable.  EEG ordered and is negative for active seizures.  Over night telemetry is wnl.  Another fall this am with lightheadedness , cardiology consulted for evaluation of presyncope and syncope.  Repeat CT head and hip x rays are negative.     Dizziness:  Appears to have improved but not completely resolved.  Check for PT vestibular evaluation.  Also is taking several medications that have  association with gait abnormality and dizziness.  Abilify has been associated with gait abnormality and less than 1% of cases, trazodone associated with dizziness and 20 to 28% of cases, alfuzosin associated with dizziness as well. Vitamin b12 levels wnl, RPR is negative.  Folate wnl.  Recommend outpatient follow up with Neurology.     Hypertension:  BP parameters are optimal.  Orthostatic vital signs are negative.    Depression, anxiety  Holding abilify and trazodone in the setting of dizziness.     Stage 3a CKD Creatinine stable at 1.66. Recheck creatinine in am.     DM uncontrolled with hyperglycemia. Not on insulin at home. Pt uses semaglutide at home.  CBG (last 3)  Recent Labs    09/04/21 2053 09/05/21 0719 09/05/21 1206  GLUCAP 212* 234* 237*   A1c is 11, started him on Semglee 10 units daily. Will need teaching to administer insulin at home.  Resume SSI.    H/o prostate cancer:  Continue with finasteride.    Hyperlipidemia:  Continue with lipitor.     Erythrocytosis:  Of unclear etiology. No Thrombocytosis.  Will need further work up with Hematology  Estimated body mass index is 26.33 kg/m as calculated from the following:   Height as of this encounter:  $'5\' 6"'W$  (1.676 m).   Weight as of this encounter: 74 kg.  Code Status: full code.  DVT Prophylaxis:  enoxaparin (LOVENOX) injection 40 mg Start: 09/02/21 1000   Level of Care: Level of care: Telemetry Medical Family Communication: None at bedside.   Disposition Plan:     Remains inpatient appropriate: possible d/c home in the next 24 hours after echocardiogram is done.   Procedures:  MRI brain Carotid duplex EEG.  Echocardiogram.   Consultants:   None.   Antimicrobials:   Anti-infectives (From admission, onward)    None        Medications  Scheduled Meds:  aspirin EC  81 mg Oral Q breakfast   atorvastatin  40 mg Oral Daily   enoxaparin (LOVENOX) injection  40 mg Subcutaneous  Daily   finasteride  5 mg Oral Daily   insulin aspart  0-5 Units Subcutaneous QHS   insulin aspart  0-9 Units Subcutaneous TID WC   insulin glargine-yfgn  10 Units Subcutaneous Daily   sodium chloride flush  3 mL Intravenous Q12H   Continuous Infusions: PRN Meds:.acetaminophen **OR** acetaminophen, polyethylene glycol, tetrahydrozoline, traZODone    Subjective:   [name] was seen and examined today.  Appears disheveled. Reports hip pain, after the fall. Pt reports he got up felt lightheaded and fell, hit his head. Denies losing consciousness today. Objective:   Vitals:   09/04/21 2053 09/05/21 0457 09/05/21 0500 09/05/21 0832  BP: 129/84 120/69  (!) 139/96  Pulse: 65 61  75  Resp: '19 18  17  '$ Temp: 98.2 F (36.8 C) 98 F (36.7 C)  98 F (36.7 C)  TempSrc: Oral Oral  Oral  SpO2: 96% 97%  99%  Weight:   74 kg   Height:        Intake/Output Summary (Last 24 hours) at 09/05/2021 1601 Last data filed at 09/05/2021 1100 Gross per 24 hour  Intake 983 ml  Output 700 ml  Net 283 ml    Filed Weights   09/02/21 1600 09/04/21 0500 09/05/21 0500  Weight: 74.9 kg 73.9 kg 74 kg     Exam General exam: Appears calm and comfortable  Respiratory system: Clear to auscultation. Respiratory effort normal. Cardiovascular system: S1 & S2 heard, RRR. No JVD, murmurs, rubs, gallops or clicks. No pedal edema. Gastrointestinal system: Abdomen is nondistended, soft and nontender. Normal bowel sounds heard. Central nervous system: Alert and oriented. No focal neurological deficits. Extremities: Symmetric 5 x 5 power. Skin: No rashes, lesions or ulcers Psychiatry:  Mood & affect appropriate.     Data Reviewed:  I have personally reviewed following labs and imaging studies   CBC Lab Results  Component Value Date   WBC 6.7 09/05/2021   RBC 6.18 (H) 09/05/2021   HGB 18.0 (H) 09/05/2021   HCT 54.4 (H) 09/05/2021   MCV 88.0 09/05/2021   MCH 29.1 09/05/2021   PLT 201 09/05/2021    MCHC 33.1 09/05/2021   RDW 12.7 09/05/2021   LYMPHSABS 1.6 09/05/2021   MONOABS 0.5 09/05/2021   EOSABS 0.2 09/05/2021   BASOSABS 0.1 87/68/1157     Last metabolic panel Lab Results  Component Value Date   NA 138 09/05/2021   K 4.5 09/05/2021   CL 101 09/05/2021   CO2 27 09/05/2021   BUN 43 (H) 09/05/2021   CREATININE 1.78 (H) 09/05/2021   GLUCOSE 256 (H) 09/05/2021   GFRNONAA 41 (L) 09/05/2021   GFRAA 48 (L) 03/13/2020   CALCIUM 9.8 09/05/2021  PHOS 4.4 10/27/2020   PROT 6.5 09/02/2021   ALBUMIN 3.3 (L) 09/02/2021   LABGLOB 2.6 07/17/2020   AGRATIO 1.6 07/17/2020   BILITOT 0.8 09/02/2021   ALKPHOS 93 09/02/2021   AST 15 09/02/2021   ALT 19 09/02/2021   ANIONGAP 10 09/05/2021    CBG (last 3)  Recent Labs    09/04/21 2053 09/05/21 0719 09/05/21 1206  GLUCAP 212* 234* 237*       Coagulation Profile: No results for input(s): "INR", "PROTIME" in the last 168 hours.   Radiology Studies: CT HEAD WO CONTRAST (5MM)  Result Date: 09/05/2021 CLINICAL DATA:  Dizziness, persistent/recurrent, cardiac or vascular cause suspected. Syncope and collapse. EXAM: CT HEAD WITHOUT CONTRAST TECHNIQUE: Contiguous axial images were obtained from the base of the skull through the vertex without intravenous contrast. RADIATION DOSE REDUCTION: This exam was performed according to the departmental dose-optimization program which includes automated exposure control, adjustment of the mA and/or kV according to patient size and/or use of iterative reconstruction technique. COMPARISON:  Head CT and MRI 09/01/2021 FINDINGS: Brain: There is no evidence of an acute infarct, intracranial hemorrhage, mass, midline shift, or extra-axial fluid collection. Patchy and confluent hypodensities in the cerebral white matter bilaterally are unchanged and nonspecific but compatible with extensive chronic small vessel ischemic disease. Age advanced cerebral atrophy is again noted with asymmetric dilatation of  the occipital horn of the left lateral ventricle Vascular: Calcified atherosclerosis at the skull base. No hyperdense vessel. Skull: No fracture or suspicious osseous lesion. Sinuses/Orbits: Visualized paranasal sinuses and mastoid air cells are clear. Unremarkable orbits. Other: None. IMPRESSION: 1. No evidence of acute intracranial abnormality. 2. Age advanced chronic small vessel ischemic disease and cerebral atrophy. Electronically Signed   By: Logan Bores M.D.   On: 09/05/2021 11:28   DG HIP UNILAT WITH PELVIS 2-3 VIEWS LEFT  Result Date: 09/05/2021 CLINICAL DATA:  Provided history: Fall, left greater than right hip pain. EXAM: DG HIP (WITH OR WITHOUT PELVIS) 2-3V LEFT COMPARISON:  Same day radiographs of the right hip 09/05/2021. Radiographs of the left hip 07/31/2020. FINDINGS: There is normal bony alignment. No evidence of acute osseous or articular abnormality. The joint spaces are maintained. IMPRESSION: No evidence of acute osseous or articular abnormality. Electronically Signed   By: Kellie Simmering D.O.   On: 09/05/2021 10:15   DG HIP UNILAT WITH PELVIS 2-3 VIEWS RIGHT  Result Date: 09/05/2021 CLINICAL DATA:  Provided history: Fall. Pain in left greater than right hips. EXAM: DG HIP (WITH OR WITHOUT PELVIS) 2-3V RIGHT COMPARISON:  Same day radiographs of the left hip 09/05/2021. Radiographs the left hip 07/31/2020. FINDINGS: There is normal bony alignment. No evidence of acute osseous or articular abnormality. Mild right femoroacetabular joint osteoarthrosis. IMPRESSION: No evidence of acute osseous or articular abnormality. Electronically Signed   By: Kellie Simmering D.O.   On: 09/05/2021 10:13   ECHOCARDIOGRAM COMPLETE  Result Date: 09/04/2021    ECHOCARDIOGRAM REPORT   Patient Name:   [name] Date of Exam: 09/04/2021 Medical Rec #:  [id number]    Height:       66.0 in Accession #:    [id number]   Weight:       163.0 lb Date of Birth:  15-Jul-1955    BSA:          1.833 m Patient Age:    5  years     BP:           117/72 mmHg Patient Gender:  M            HR:           68 bpm. Exam Location:  Inpatient Procedure: 2D Echo, Cardiac Doppler and Color Doppler Indications:    Syncope  History:        Patient has prior history of Echocardiogram examinations, most                 recent 01/05/2021. Risk Factors:Hypertension, Dyslipidemia and                 Diabetes. CKD.  Sonographer:    Clayton Lefort RDCS (AE) Referring Phys: [id number] [place]  1. Left ventricular ejection fraction, by estimation, is 60 to 65%. The left ventricle has normal function. The left ventricle has no regional wall motion abnormalities. There is mild concentric left ventricular hypertrophy. Left ventricular diastolic parameters are consistent with Grade I diastolic dysfunction (impaired relaxation).  2. Right ventricular systolic function is normal. The right ventricular size is normal.  3. The mitral valve is abnormal. No evidence of mitral valve regurgitation. No evidence of mitral stenosis.  4. The aortic valve was not well visualized. Aortic valve regurgitation is not visualized. Aortic valve sclerosis is present, with no evidence of aortic valve stenosis.  5. The inferior vena cava is normal in size with greater than 50% respiratory variability, suggesting right atrial pressure of 3 mmHg. Comparison(s): No significant change from prior study. FINDINGS  Left Ventricle: Left ventricular ejection fraction, by estimation, is 60 to 65%. The left ventricle has normal function. The left ventricle has no regional wall motion abnormalities. The left ventricular internal cavity size was normal in size. There is  mild concentric left ventricular hypertrophy. Left ventricular diastolic parameters are consistent with Grade I diastolic dysfunction (impaired relaxation). Right Ventricle: The right ventricular size is normal. No increase in right ventricular wall thickness. Right ventricular systolic function is normal. Left  Atrium: Left atrial size was normal in size. Right Atrium: Right atrial size was normal in size. Pericardium: Trivial pericardial effusion is present. Mitral Valve: The mitral valve is abnormal. No evidence of mitral valve regurgitation. No evidence of mitral valve stenosis. Tricuspid Valve: The tricuspid valve is normal in structure. Tricuspid valve regurgitation is not demonstrated. No evidence of tricuspid stenosis. Aortic Valve: The aortic valve was not well visualized. Aortic valve regurgitation is not visualized. Aortic valve sclerosis is present, with no evidence of aortic valve stenosis. Aortic valve mean gradient measures 4.0 mmHg. Aortic valve peak gradient measures 6.6 mmHg. Aortic valve area, by VTI measures 3.37 cm. Pulmonic Valve: The pulmonic valve was not well visualized. Pulmonic valve regurgitation is not visualized. No evidence of pulmonic stenosis. Aorta: The aortic root and ascending aorta are structurally normal, with no evidence of dilitation. Venous: The inferior vena cava is normal in size with greater than 50% respiratory variability, suggesting right atrial pressure of 3 mmHg. IAS/Shunts: No atrial level shunt detected by color flow Doppler.  LEFT VENTRICLE PLAX 2D LVIDd:         4.40 cm   Diastology LVIDs:         2.90 cm   LV e' medial:    6.09 cm/s LV PW:         1.20 cm   LV E/e' medial:  11.0 LV IVS:        1.00 cm   LV e' lateral:   7.94 cm/s LVOT diam:     2.10 cm  LV E/e' lateral: 8.4 LV SV:         89 LV SV Index:   49 LVOT Area:     3.46 cm  RIGHT VENTRICLE             IVC RV S prime:     16.90 cm/s  IVC diam: 1.40 cm TAPSE (M-mode): 1.7 cm LEFT ATRIUM           Index        RIGHT ATRIUM           Index LA diam:      3.50 cm 1.91 cm/m   RA Area:     13.90 cm LA Vol (A2C): 28.3 ml 15.44 ml/m  RA Volume:   34.20 ml  18.66 ml/m LA Vol (A4C): 42.7 ml 23.29 ml/m  AORTIC VALVE AV Area (Vmax):    3.65 cm AV Area (Vmean):   3.54 cm AV Area (VTI):     3.37 cm AV Vmax:            128.00 cm/s AV Vmean:          98.700 cm/s AV VTI:            0.265 m AV Peak Grad:      6.6 mmHg AV Mean Grad:      4.0 mmHg LVOT Vmax:         135.00 cm/s LVOT Vmean:        101.000 cm/s LVOT VTI:          0.258 m LVOT/AV VTI ratio: 0.97  AORTA Ao Root diam: 3.30 cm Ao Asc diam:  3.20 cm MITRAL VALVE MV Area (PHT): 2.02 cm    SHUNTS MV Decel Time: 376 msec    Systemic VTI:  0.26 m MV E velocity: 66.80 cm/s  Systemic Diam: 2.10 cm MV A velocity: 82.30 cm/s MV E/A ratio:  0.81 Rudean Haskell MD Electronically signed by Rudean Haskell MD Signature Date/Time: 09/04/2021/5:23:25 PM    Final    EEG adult  Result Date: 09/03/2021 Lora Havens, MD     09/03/2021  5:45 PM Patient Name: [name] MRN: [id number] Epilepsy Attending: Lora Havens Referring Physician/Provider: Hosie Poisson, MD Date: 09/03/2021 Duration: 23.04 mins Patient history: 66yo m with syncope. EEG to evaluate for seizure. Level of alertness: Awake AEDs during EEG study: None Technical aspects: This EEG study was done with scalp electrodes positioned according to the 10-20 International system of electrode placement. Electrical activity was acquired at a sampling rate of '500Hz'$  and reviewed with a high frequency filter of '70Hz'$  and a low frequency filter of '1Hz'$ . EEG data were recorded continuously and digitally stored. Description: The posterior dominant rhythm consists of 9 Hz activity of moderate voltage (25-35 uV) seen predominantly in posterior head regions, symmetric and reactive to eye opening and eye closing.  Hyperventilation and photic stimulation were not performed.   IMPRESSION: This study is within normal limits. No seizures or epileptiform discharges were seen throughout the recording. Priyanka Rosary Lively M.D. [facility] 09/05/2021, 4:01 PM  Available via Epic secure chat 7am-7pm After 7 pm, please refer to night coverage provider listed on amion.

## 2021-09-06 DIAGNOSIS — Z515 Encounter for palliative care: Secondary | ICD-10-CM | POA: Diagnosis not present

## 2021-09-06 DIAGNOSIS — E782 Mixed hyperlipidemia: Secondary | ICD-10-CM

## 2021-09-06 DIAGNOSIS — R2681 Unsteadiness on feet: Secondary | ICD-10-CM

## 2021-09-06 DIAGNOSIS — R55 Syncope and collapse: Secondary | ICD-10-CM | POA: Diagnosis not present

## 2021-09-06 DIAGNOSIS — N1831 Chronic kidney disease, stage 3a: Secondary | ICD-10-CM | POA: Diagnosis not present

## 2021-09-06 DIAGNOSIS — F32A Depression, unspecified: Secondary | ICD-10-CM | POA: Diagnosis not present

## 2021-09-06 DIAGNOSIS — R42 Dizziness and giddiness: Secondary | ICD-10-CM | POA: Diagnosis not present

## 2021-09-06 DIAGNOSIS — I10 Essential (primary) hypertension: Secondary | ICD-10-CM | POA: Diagnosis not present

## 2021-09-06 DIAGNOSIS — E1121 Type 2 diabetes mellitus with diabetic nephropathy: Secondary | ICD-10-CM | POA: Diagnosis not present

## 2021-09-06 DIAGNOSIS — Z789 Other specified health status: Secondary | ICD-10-CM

## 2021-09-06 LAB — GLUCOSE, CAPILLARY
Glucose-Capillary: 204 mg/dL — ABNORMAL HIGH (ref 70–99)
Glucose-Capillary: 211 mg/dL — ABNORMAL HIGH (ref 70–99)
Glucose-Capillary: 217 mg/dL — ABNORMAL HIGH (ref 70–99)
Glucose-Capillary: 310 mg/dL — ABNORMAL HIGH (ref 70–99)

## 2021-09-06 MED ORDER — INSULIN GLARGINE-YFGN 100 UNIT/ML ~~LOC~~ SOLN
15.0000 [IU] | Freq: Every day | SUBCUTANEOUS | Status: DC
  Administered 2021-09-06 – 2021-09-09 (×4): 15 [IU] via SUBCUTANEOUS
  Filled 2021-09-06 (×5): qty 0.15

## 2021-09-06 MED ORDER — MECLIZINE HCL 12.5 MG PO TABS: 12.5000 mg | ORAL_TABLET | Freq: Two times a day (BID) | ORAL | 0 refills | Status: DC | PRN

## 2021-09-06 MED ORDER — LANTUS 100 UNIT/ML ~~LOC~~ SOLN: 15.0000 [IU] | Freq: Every day | SUBCUTANEOUS | 11 refills | Status: DC

## 2021-09-06 MED ORDER — INSULIN ASPART 100 UNIT/ML IJ SOLN: INTRAMUSCULAR | 11 refills | Status: DC

## 2021-09-06 MED ORDER — MECLIZINE HCL 25 MG PO TABS
12.5000 mg | ORAL_TABLET | Freq: Two times a day (BID) | ORAL | Status: DC | PRN
Start: 2021-09-06 — End: 2021-09-11
  Administered 2021-09-07 – 2021-09-09 (×3): 12.5 mg via ORAL
  Filled 2021-09-06 (×3): qty 1

## 2021-09-06 NOTE — Progress Notes (Signed)
[facility]                                                                               [name], is a 66 y.o. male, DOB - March 04, 1956, [place] date - 09/01/2021    Outpatient Primary MD for the patient is Pcp, No  LOS - 0  days    Brief summary   [name] is a 66 y.o. male with medical history significant of anemia, history of postpolio syndrome, hypertension, depression, anxiety, insomnia, diabetes, CKD 3, hyperlipidemia, prostate cancer, GERD, macular degeneration presenting with dizziness. .  CT head showed no acute abnormality but demonstrate progressive atrophy and white matter disease consistent with chronic microvascular disease.  MRI of the brain showed no acute abnormality,.   He was admitted for evaluation of syncope and vertigo and some lightheadedness. So far work up has been negative.      Assessment & Plan    Assessment and Plan:  Intermittent dizziness and Syncope:  Differential include autonomic dysregulation, neuropathy from DM vs neurological etiology vs arrhythmias.  CT head showed no acute abnormality but demonstrate progressive atrophy and white matter disease consistent with chronic microvascular disease.  MRI of the brain showed no acute abnormality,. Orthostatic vital signs are negative.  Echocardiogram showed preserved LVef and grade 1 diastolic dysfunction. No regional wall abnormalities.  Carotid duplex is unremarkable.  EEG ordered and is negative for active seizures.  Over night telemetry is wnl.  Another fall this am with lightheadedness , cardiology consulted for evaluation of presyncope and syncope.  Repeat CT head and hip x rays are negative. No further work up as per cardiology.     Dizziness:  Appears to have improved but not completely resolved.  Check for PT vestibular evaluation.  Also is taking several medications that have association with gait abnormality and dizziness.  Abilify has been associated  with gait abnormality and less than 1% of cases, trazodone associated with dizziness and 20 to 28% of cases, alfuzosin associated with dizziness as well. Vitamin b12 levels wnl, RPR is negative.  Folate wnl.  Recommend outpatient follow up with Neurology.     Hypertension:  BP parameters are well controlled.  Orthostatic vital signs are negative.    Depression, anxiety  Holding abilify and trazodone in the setting of dizziness.  Pt reports being depressed , teary and wants to talk to chaplin.  Requested psychiatry consult for medication assistance.  No suicidal ideations.     Stage 3a CKD Creatinine stable     DM uncontrolled with hyperglycemia. Not on insulin at home. Pt uses semaglutide at home.  CBG (last 3)  Recent Labs    09/05/21 2106 09/06/21 0733 09/06/21 1143  GLUCAP 294* 204* 211*   A1c is 11, started him on Semglee 10 units daily, increased to 15 units daily.  Resume SSI.  Pt refusing insulin on discharge.    H/o prostate cancer:  Continue with finasteride.    Hyperlipidemia:  Continue with lipitor.     Erythrocytosis:  Of unclear etiology. No Thrombocytosis.  Will need further work up with Hematology as outpatient.    Pt is deconditioned, had severe  episodes of syncope and pre syncopal episodes, along with vertigo, non compliant to meds , depression, poor prognosis, requested palliative care consult and psychiatry consult and TOC for evaluation of safe disposition , possibly to ALF.   Estimated body mass index is 26.4 kg/m as calculated from the following:   Height as of this encounter: '5\' 6"'$  (1.676 m).   Weight as of this encounter: 74.2 kg.  Code Status: full code.  DVT Prophylaxis:  enoxaparin (LOVENOX) injection 40 mg Start: 09/02/21 1000   Level of Care: Level of care: Telemetry Medical Family Communication: None at bedside.   Disposition Plan:     Remains inpatient appropriate: Palliative care consult, pt requesting to speak to [facility]  for ALF.   Procedures:  MRI brain Carotid duplex EEG.  Echocardiogram.   Consultants:   Cardiology Palliative care Psychiatry.   Antimicrobials:   Anti-infectives (From admission, onward)    None        Medications  Scheduled Meds:  aspirin EC  81 mg Oral Q breakfast   atorvastatin  40 mg Oral Daily   enoxaparin (LOVENOX) injection  40 mg Subcutaneous Daily   finasteride  5 mg Oral Daily   insulin aspart  0-5 Units Subcutaneous QHS   insulin aspart  0-9 Units Subcutaneous TID WC   insulin glargine-yfgn  15 Units Subcutaneous Daily   sodium chloride flush  3 mL Intravenous Q12H   Continuous Infusions: PRN Meds:.acetaminophen **OR** acetaminophen, meclizine, polyethylene glycol, tetrahydrozoline, traZODone    Subjective:   [name] was seen and examined today.  Crying, not in distress. Reports doesn't feel good to go home.  Objective:   Vitals:   09/05/21 1631 09/05/21 2106 09/06/21 0500 09/06/21 0635  BP: (!) 138/95 (!) 149/81  (!) 141/89  Pulse: 76 76  (!) 58  Resp: 18   16  Temp: 98.2 F (36.8 C)   98 F (36.7 C)  TempSrc: Oral   Oral  SpO2: 99% 98%  99%  Weight:   74.2 kg   Height:        Intake/Output Summary (Last 24 hours) at 09/06/2021 1500 Last data filed at 09/06/2021 1204 Gross per 24 hour  Intake 1320 ml  Output 1525 ml  Net -205 ml    Filed Weights   09/04/21 0500 09/05/21 0500 09/06/21 0500  Weight: 73.9 kg 74 kg 74.2 kg     Exam General exam: Appears calm and comfortable  Respiratory system: Clear to auscultation. Respiratory effort normal. Cardiovascular system: S1 & S2 heard, RRR. No JVD, No pedal edema. Gastrointestinal system: Abdomen is nondistended, soft and nontender. Normal bowel sounds heard. Central nervous system: Alert and oriented. No focal neurological deficits. Extremities: Symmetric 5 x 5 power. Skin: No rashes seen. Psychiatry: teary, depressed    Data Reviewed:  I have personally reviewed following  labs and imaging studies   CBC Lab Results  Component Value Date   WBC 6.7 09/05/2021   RBC 6.18 (H) 09/05/2021   HGB 18.0 (H) 09/05/2021   HCT 54.4 (H) 09/05/2021   MCV 88.0 09/05/2021   MCH 29.1 09/05/2021   PLT 201 09/05/2021   MCHC 33.1 09/05/2021   RDW 12.7 09/05/2021   LYMPHSABS 1.6 09/05/2021   MONOABS 0.5 09/05/2021   EOSABS 0.2 09/05/2021   BASOSABS 0.1 17/00/1749     Last metabolic panel Lab Results  Component Value Date   NA 138 09/05/2021   K 4.5 09/05/2021   CL 101 09/05/2021   CO2 27  09/05/2021   BUN 43 (H) 09/05/2021   CREATININE 1.78 (H) 09/05/2021   GLUCOSE 256 (H) 09/05/2021   GFRNONAA 41 (L) 09/05/2021   GFRAA 48 (L) 03/13/2020   CALCIUM 9.8 09/05/2021   PHOS 4.4 10/27/2020   PROT 6.5 09/02/2021   ALBUMIN 3.3 (L) 09/02/2021   LABGLOB 2.6 07/17/2020   AGRATIO 1.6 07/17/2020   BILITOT 0.8 09/02/2021   ALKPHOS 93 09/02/2021   AST 15 09/02/2021   ALT 19 09/02/2021   ANIONGAP 10 09/05/2021    CBG (last 3)  Recent Labs    09/05/21 2106 09/06/21 0733 09/06/21 1143  GLUCAP 294* 204* 211*       Coagulation Profile: No results for input(s): "INR", "PROTIME" in the last 168 hours.   Radiology Studies: CT HEAD WO CONTRAST (5MM)  Result Date: 09/05/2021 CLINICAL DATA:  Dizziness, persistent/recurrent, cardiac or vascular cause suspected. Syncope and collapse. EXAM: CT HEAD WITHOUT CONTRAST TECHNIQUE: Contiguous axial images were obtained from the base of the skull through the vertex without intravenous contrast. RADIATION DOSE REDUCTION: This exam was performed according to the departmental dose-optimization program which includes automated exposure control, adjustment of the mA and/or kV according to patient size and/or use of iterative reconstruction technique. COMPARISON:  Head CT and MRI 09/01/2021 FINDINGS: Brain: There is no evidence of an acute infarct, intracranial hemorrhage, mass, midline shift, or extra-axial fluid collection. Patchy  and confluent hypodensities in the cerebral white matter bilaterally are unchanged and nonspecific but compatible with extensive chronic small vessel ischemic disease. Age advanced cerebral atrophy is again noted with asymmetric dilatation of the occipital horn of the left lateral ventricle Vascular: Calcified atherosclerosis at the skull base. No hyperdense vessel. Skull: No fracture or suspicious osseous lesion. Sinuses/Orbits: Visualized paranasal sinuses and mastoid air cells are clear. Unremarkable orbits. Other: None. IMPRESSION: 1. No evidence of acute intracranial abnormality. 2. Age advanced chronic small vessel ischemic disease and cerebral atrophy. Electronically Signed   By: Logan Bores M.D.   On: 09/05/2021 11:28   DG HIP UNILAT WITH PELVIS 2-3 VIEWS LEFT  Result Date: 09/05/2021 CLINICAL DATA:  Provided history: Fall, left greater than right hip pain. EXAM: DG HIP (WITH OR WITHOUT PELVIS) 2-3V LEFT COMPARISON:  Same day radiographs of the right hip 09/05/2021. Radiographs of the left hip 07/31/2020. FINDINGS: There is normal bony alignment. No evidence of acute osseous or articular abnormality. The joint spaces are maintained. IMPRESSION: No evidence of acute osseous or articular abnormality. Electronically Signed   By: Kellie Simmering D.O.   On: 09/05/2021 10:15   DG HIP UNILAT WITH PELVIS 2-3 VIEWS RIGHT  Result Date: 09/05/2021 CLINICAL DATA:  Provided history: Fall. Pain in left greater than right hips. EXAM: DG HIP (WITH OR WITHOUT PELVIS) 2-3V RIGHT COMPARISON:  Same day radiographs of the left hip 09/05/2021. Radiographs the left hip 07/31/2020. FINDINGS: There is normal bony alignment. No evidence of acute osseous or articular abnormality. Mild right femoroacetabular joint osteoarthrosis. IMPRESSION: No evidence of acute osseous or articular abnormality. Electronically Signed   By: Kellie Simmering D.O.   On: 09/05/2021 10:13   ECHOCARDIOGRAM COMPLETE  Result Date: 09/04/2021     ECHOCARDIOGRAM REPORT   Patient Name:   [name] Date of Exam: 09/04/2021 Medical Rec #:  [id number]    Height:       66.0 in Accession #:    [id number]   Weight:       163.0 lb Date of Birth:  01-Apr-1955  BSA:          1.833 m Patient Age:    43 years     BP:           117/72 mmHg Patient Gender: M            HR:           68 bpm. Exam Location:  Inpatient Procedure: 2D Echo, Cardiac Doppler and Color Doppler Indications:    Syncope  History:        Patient has prior history of Echocardiogram examinations, most                 recent 01/05/2021. Risk Factors:Hypertension, Dyslipidemia and                 Diabetes. CKD.  Sonographer:    Clayton Lefort RDCS (AE) Referring Phys: [id number] [place]  1. Left ventricular ejection fraction, by estimation, is 60 to 65%. The left ventricle has normal function. The left ventricle has no regional wall motion abnormalities. There is mild concentric left ventricular hypertrophy. Left ventricular diastolic parameters are consistent with Grade I diastolic dysfunction (impaired relaxation).  2. Right ventricular systolic function is normal. The right ventricular size is normal.  3. The mitral valve is abnormal. No evidence of mitral valve regurgitation. No evidence of mitral stenosis.  4. The aortic valve was not well visualized. Aortic valve regurgitation is not visualized. Aortic valve sclerosis is present, with no evidence of aortic valve stenosis.  5. The inferior vena cava is normal in size with greater than 50% respiratory variability, suggesting right atrial pressure of 3 mmHg. Comparison(s): No significant change from prior study. FINDINGS  Left Ventricle: Left ventricular ejection fraction, by estimation, is 60 to 65%. The left ventricle has normal function. The left ventricle has no regional wall motion abnormalities. The left ventricular internal cavity size was normal in size. There is  mild concentric left ventricular hypertrophy. Left ventricular  diastolic parameters are consistent with Grade I diastolic dysfunction (impaired relaxation). Right Ventricle: The right ventricular size is normal. No increase in right ventricular wall thickness. Right ventricular systolic function is normal. Left Atrium: Left atrial size was normal in size. Right Atrium: Right atrial size was normal in size. Pericardium: Trivial pericardial effusion is present. Mitral Valve: The mitral valve is abnormal. No evidence of mitral valve regurgitation. No evidence of mitral valve stenosis. Tricuspid Valve: The tricuspid valve is normal in structure. Tricuspid valve regurgitation is not demonstrated. No evidence of tricuspid stenosis. Aortic Valve: The aortic valve was not well visualized. Aortic valve regurgitation is not visualized. Aortic valve sclerosis is present, with no evidence of aortic valve stenosis. Aortic valve mean gradient measures 4.0 mmHg. Aortic valve peak gradient measures 6.6 mmHg. Aortic valve area, by VTI measures 3.37 cm. Pulmonic Valve: The pulmonic valve was not well visualized. Pulmonic valve regurgitation is not visualized. No evidence of pulmonic stenosis. Aorta: The aortic root and ascending aorta are structurally normal, with no evidence of dilitation. Venous: The inferior vena cava is normal in size with greater than 50% respiratory variability, suggesting right atrial pressure of 3 mmHg. IAS/Shunts: No atrial level shunt detected by color flow Doppler.  LEFT VENTRICLE PLAX 2D LVIDd:         4.40 cm   Diastology LVIDs:         2.90 cm   LV e' medial:    6.09 cm/s LV PW:  1.20 cm   LV E/e' medial:  11.0 LV IVS:        1.00 cm   LV e' lateral:   7.94 cm/s LVOT diam:     2.10 cm   LV E/e' lateral: 8.4 LV SV:         89 LV SV Index:   49 LVOT Area:     3.46 cm  RIGHT VENTRICLE             IVC RV S prime:     16.90 cm/s  IVC diam: 1.40 cm TAPSE (M-mode): 1.7 cm LEFT ATRIUM           Index        RIGHT ATRIUM           Index LA diam:      3.50 cm 1.91  cm/m   RA Area:     13.90 cm LA Vol (A2C): 28.3 ml 15.44 ml/m  RA Volume:   34.20 ml  18.66 ml/m LA Vol (A4C): 42.7 ml 23.29 ml/m  AORTIC VALVE AV Area (Vmax):    3.65 cm AV Area (Vmean):   3.54 cm AV Area (VTI):     3.37 cm AV Vmax:           128.00 cm/s AV Vmean:          98.700 cm/s AV VTI:            0.265 m AV Peak Grad:      6.6 mmHg AV Mean Grad:      4.0 mmHg LVOT Vmax:         135.00 cm/s LVOT Vmean:        101.000 cm/s LVOT VTI:          0.258 m LVOT/AV VTI ratio: 0.97  AORTA Ao Root diam: 3.30 cm Ao Asc diam:  3.20 cm MITRAL VALVE MV Area (PHT): 2.02 cm    SHUNTS MV Decel Time: 376 msec    Systemic VTI:  0.26 m MV E velocity: 66.80 cm/s  Systemic Diam: 2.10 cm MV A velocity: 82.30 cm/s MV E/A ratio:  0.81 Rudean Haskell MD Electronically signed by Rudean Haskell MD Signature Date/Time: 09/04/2021/5:23:25 PM    Final        Hosie Poisson M.D. [facility] 09/06/2021, 3:00 PM  Available via Epic secure chat 7am-7pm After 7 pm, please refer to night coverage provider listed on amion.

## 2021-09-06 NOTE — Progress Notes (Signed)
Progress Note  Patient Name: [name] Date of Encounter: 09/06/2021  [facility] Cardiologist: Irish Lack  Subjective   Mild dizziness today. No pain.   Inpatient Medications    Scheduled Meds:  aspirin EC  81 mg Oral Q breakfast   atorvastatin  40 mg Oral Daily   enoxaparin (LOVENOX) injection  40 mg Subcutaneous Daily   finasteride  5 mg Oral Daily   insulin aspart  0-5 Units Subcutaneous QHS   insulin aspart  0-9 Units Subcutaneous TID WC   insulin glargine-yfgn  15 Units Subcutaneous Daily   sodium chloride flush  3 mL Intravenous Q12H   Continuous Infusions:  PRN Meds: acetaminophen **OR** acetaminophen, meclizine, polyethylene glycol, tetrahydrozoline, traZODone   Vital Signs    Vitals:   09/05/21 1631 09/05/21 2106 09/06/21 0500 09/06/21 0635  BP: (!) 138/95 (!) 149/81  (!) 141/89  Pulse: 76 76  (!) 58  Resp: 18   16  Temp: 98.2 F (36.8 C)   98 F (36.7 C)  TempSrc: Oral   Oral  SpO2: 99% 98%  99%  Weight:   74.2 kg   Height:        Intake/Output Summary (Last 24 hours) at 09/06/2021 6283 Last data filed at 09/06/2021 0600 Gross per 24 hour  Intake 1243 ml  Output 1400 ml  Net -157 ml      09/06/2021    5:00 AM 09/05/2021    5:00 AM 09/04/2021    5:00 AM  Last 3 Weights  Weight (lbs) 163 lb 9.3 oz 163 lb 2.3 oz 163 lb  Weight (kg) 74.2 kg 74 kg 73.936 kg      Telemetry    sinus - Personally Reviewed  ECG    No AM EKG - Personally Reviewed  Physical Exam   GEN: No acute distress.   Neck: No JVD Cardiac: RRR, no murmurs, rubs, or gallops.  Respiratory: Clear to auscultation bilaterally. GI: Soft, nontender, non-distended  MS: No edema; No deformity. Neuro:  Nonfocal  Psych: Normal affect   Labs    High Sensitivity Troponin:   Recent Labs  Lab 09/01/21 1808 09/01/21 2058  TROPONINIHS 5 7     Chemistry Recent Labs  Lab 09/01/21 1657 09/02/21 0357 09/05/21 0803  NA 137 136 138  K 4.0 3.6 4.5  CL 98 103 101  CO2 '23  24 27  '$ GLUCOSE 246* 256* 256*  BUN 28* 27* 43*  CREATININE 1.84* 1.66* 1.78*  CALCIUM 9.6 9.1 9.8  MG  --  1.9  --   PROT 7.4 6.5  --   ALBUMIN 4.0 3.3*  --   AST 18 15  --   ALT 23 19  --   ALKPHOS 114 93  --   BILITOT 0.8 0.8  --   GFRNONAA 40* 45* 41*  ANIONGAP 16* 9 10    Lipids No results for input(s): "CHOL", "TRIG", "HDL", "LABVLDL", "LDLCALC", "CHOLHDL" in the last 168 hours.  Hematology Recent Labs  Lab 09/01/21 1657 09/02/21 0357 09/05/21 0803  WBC 8.4 7.6 6.7  RBC 6.09* 5.99* 6.18*  HGB 18.1* 17.6* 18.0*  HCT 53.6* 53.6* 54.4*  MCV 88.0 89.5 88.0  MCH 29.7 29.4 29.1  MCHC 33.8 32.8 33.1  RDW 12.7 12.8 12.7  PLT 235 194 201   Thyroid No results for input(s): "TSH", "FREET4" in the last 168 hours.  BNPNo results for input(s): "BNP", "PROBNP" in the last 168 hours.  DDimer No results for input(s): "DDIMER"  in the last 168 hours.   Radiology    CT HEAD WO CONTRAST (5MM)  Result Date: 09/05/2021 CLINICAL DATA:  Dizziness, persistent/recurrent, cardiac or vascular cause suspected. Syncope and collapse. EXAM: CT HEAD WITHOUT CONTRAST TECHNIQUE: Contiguous axial images were obtained from the base of the skull through the vertex without intravenous contrast. RADIATION DOSE REDUCTION: This exam was performed according to the departmental dose-optimization program which includes automated exposure control, adjustment of the mA and/or kV according to patient size and/or use of iterative reconstruction technique. COMPARISON:  Head CT and MRI 09/01/2021 FINDINGS: Brain: There is no evidence of an acute infarct, intracranial hemorrhage, mass, midline shift, or extra-axial fluid collection. Patchy and confluent hypodensities in the cerebral white matter bilaterally are unchanged and nonspecific but compatible with extensive chronic small vessel ischemic disease. Age advanced cerebral atrophy is again noted with asymmetric dilatation of the occipital horn of the left lateral  ventricle Vascular: Calcified atherosclerosis at the skull base. No hyperdense vessel. Skull: No fracture or suspicious osseous lesion. Sinuses/Orbits: Visualized paranasal sinuses and mastoid air cells are clear. Unremarkable orbits. Other: None. IMPRESSION: 1. No evidence of acute intracranial abnormality. 2. Age advanced chronic small vessel ischemic disease and cerebral atrophy. Electronically Signed   By: Logan Bores M.D.   On: 09/05/2021 11:28   DG HIP UNILAT WITH PELVIS 2-3 VIEWS LEFT  Result Date: 09/05/2021 CLINICAL DATA:  Provided history: Fall, left greater than right hip pain. EXAM: DG HIP (WITH OR WITHOUT PELVIS) 2-3V LEFT COMPARISON:  Same day radiographs of the right hip 09/05/2021. Radiographs of the left hip 07/31/2020. FINDINGS: There is normal bony alignment. No evidence of acute osseous or articular abnormality. The joint spaces are maintained. IMPRESSION: No evidence of acute osseous or articular abnormality. Electronically Signed   By: Kellie Simmering D.O.   On: 09/05/2021 10:15   DG HIP UNILAT WITH PELVIS 2-3 VIEWS RIGHT  Result Date: 09/05/2021 CLINICAL DATA:  Provided history: Fall. Pain in left greater than right hips. EXAM: DG HIP (WITH OR WITHOUT PELVIS) 2-3V RIGHT COMPARISON:  Same day radiographs of the left hip 09/05/2021. Radiographs the left hip 07/31/2020. FINDINGS: There is normal bony alignment. No evidence of acute osseous or articular abnormality. Mild right femoroacetabular joint osteoarthrosis. IMPRESSION: No evidence of acute osseous or articular abnormality. Electronically Signed   By: Kellie Simmering D.O.   On: 09/05/2021 10:13   ECHOCARDIOGRAM COMPLETE  Result Date: 09/04/2021    ECHOCARDIOGRAM REPORT   Patient Name:   [name] Date of Exam: 09/04/2021 Medical Rec #:  [id number]    Height:       66.0 in Accession #:    [id number]   Weight:       163.0 lb Date of Birth:  1956/03/10    BSA:          1.833 m Patient Age:    66 years     BP:           117/72 mmHg  Patient Gender: M            HR:           68 bpm. Exam Location:  Inpatient Procedure: 2D Echo, Cardiac Doppler and Color Doppler Indications:    Syncope  History:        Patient has prior history of Echocardiogram examinations, most                 recent 01/05/2021. Risk Factors:Hypertension, Dyslipidemia and  Diabetes. CKD.  Sonographer:    Clayton Lefort RDCS (AE) Referring Phys: [id number] [place]  1. Left ventricular ejection fraction, by estimation, is 60 to 65%. The left ventricle has normal function. The left ventricle has no regional wall motion abnormalities. There is mild concentric left ventricular hypertrophy. Left ventricular diastolic parameters are consistent with Grade I diastolic dysfunction (impaired relaxation).  2. Right ventricular systolic function is normal. The right ventricular size is normal.  3. The mitral valve is abnormal. No evidence of mitral valve regurgitation. No evidence of mitral stenosis.  4. The aortic valve was not well visualized. Aortic valve regurgitation is not visualized. Aortic valve sclerosis is present, with no evidence of aortic valve stenosis.  5. The inferior vena cava is normal in size with greater than 50% respiratory variability, suggesting right atrial pressure of 3 mmHg. Comparison(s): No significant change from prior study. FINDINGS  Left Ventricle: Left ventricular ejection fraction, by estimation, is 60 to 65%. The left ventricle has normal function. The left ventricle has no regional wall motion abnormalities. The left ventricular internal cavity size was normal in size. There is  mild concentric left ventricular hypertrophy. Left ventricular diastolic parameters are consistent with Grade I diastolic dysfunction (impaired relaxation). Right Ventricle: The right ventricular size is normal. No increase in right ventricular wall thickness. Right ventricular systolic function is normal. Left Atrium: Left atrial size was normal  in size. Right Atrium: Right atrial size was normal in size. Pericardium: Trivial pericardial effusion is present. Mitral Valve: The mitral valve is abnormal. No evidence of mitral valve regurgitation. No evidence of mitral valve stenosis. Tricuspid Valve: The tricuspid valve is normal in structure. Tricuspid valve regurgitation is not demonstrated. No evidence of tricuspid stenosis. Aortic Valve: The aortic valve was not well visualized. Aortic valve regurgitation is not visualized. Aortic valve sclerosis is present, with no evidence of aortic valve stenosis. Aortic valve mean gradient measures 4.0 mmHg. Aortic valve peak gradient measures 6.6 mmHg. Aortic valve area, by VTI measures 3.37 cm. Pulmonic Valve: The pulmonic valve was not well visualized. Pulmonic valve regurgitation is not visualized. No evidence of pulmonic stenosis. Aorta: The aortic root and ascending aorta are structurally normal, with no evidence of dilitation. Venous: The inferior vena cava is normal in size with greater than 50% respiratory variability, suggesting right atrial pressure of 3 mmHg. IAS/Shunts: No atrial level shunt detected by color flow Doppler.  LEFT VENTRICLE PLAX 2D LVIDd:         4.40 cm   Diastology LVIDs:         2.90 cm   LV e' medial:    6.09 cm/s LV PW:         1.20 cm   LV E/e' medial:  11.0 LV IVS:        1.00 cm   LV e' lateral:   7.94 cm/s LVOT diam:     2.10 cm   LV E/e' lateral: 8.4 LV SV:         89 LV SV Index:   49 LVOT Area:     3.46 cm  RIGHT VENTRICLE             IVC RV S prime:     16.90 cm/s  IVC diam: 1.40 cm TAPSE (M-mode): 1.7 cm LEFT ATRIUM           Index        RIGHT ATRIUM           Index  LA diam:      3.50 cm 1.91 cm/m   RA Area:     13.90 cm LA Vol (A2C): 28.3 ml 15.44 ml/m  RA Volume:   34.20 ml  18.66 ml/m LA Vol (A4C): 42.7 ml 23.29 ml/m  AORTIC VALVE AV Area (Vmax):    3.65 cm AV Area (Vmean):   3.54 cm AV Area (VTI):     3.37 cm AV Vmax:           128.00 cm/s AV Vmean:           98.700 cm/s AV VTI:            0.265 m AV Peak Grad:      6.6 mmHg AV Mean Grad:      4.0 mmHg LVOT Vmax:         135.00 cm/s LVOT Vmean:        101.000 cm/s LVOT VTI:          0.258 m LVOT/AV VTI ratio: 0.97  AORTA Ao Root diam: 3.30 cm Ao Asc diam:  3.20 cm MITRAL VALVE MV Area (PHT): 2.02 cm    SHUNTS MV Decel Time: 376 msec    Systemic VTI:  0.26 m MV E velocity: 66.80 cm/s  Systemic Diam: 2.10 cm MV A velocity: 82.30 cm/s MV E/A ratio:  0.81 Rudean Haskell MD Electronically signed by Rudean Haskell MD Signature Date/Time: 09/04/2021/5:23:25 PM    Final     Cardiac Studies     Patient Profile     66 y.o. male with history of medical non-compliance, HTN, depression, anxiety, depression, anemia, postpolio syndrome, DM, CKD stage 3, hyperlipidemia, prostate cancer and GERD admitted with dizziness. CT head showed no acute abnormality but demonstrated progressive atrophy and white matter disease consistent with chronic microvascular disease.  MRI of the brain showed no acute abnormality. Orthostatic vital signs negative. Echo with normal LV function. No valve issues. Telemetry without any arrhythmias. Carotid artery dopplers with minimal plaque. Cardiology asked to see in evaluation of dizziness/near syncope  Assessment & Plan    Dizziness/near syncope: No arrhythmias or heart block noted on telemetry. BP stable. Normal LV function by echo. EEG normal. Minimal carotid artery disease. I suspect that his dizziness is not cardiac related, especially given recurrence yesterday of dizziness while on telemetry with no concurrent arrhythmia noted. Possibly vertigo. Will not plan a cardiac monitor. No further cardiac workup   Cardiology sign off. Please call with questions.      For questions or updates, please contact [place] Please consult [email or web address] for contact info under        Signed, Lauree Chandler, MD  09/06/2021, 8:22 AM

## 2021-09-06 NOTE — Progress Notes (Signed)
Mobility Specialist Progress Note   09/06/21 1717  Mobility  Activity Ambulated with assistance in hallway  Level of Assistance Contact guard assist, steadying assist  Assistive Device  (HHA)  Distance Ambulated (ft) 156 ft  Activity Response Tolerated well  $Mobility charge 1 Mobility   Patient received on EOB stating they generally do not feel good this evening but was agreeable to participate in mobility. Ambulated in hall w/ contact guard and pt using railings for HHA. RN stating that pt's gait is a lot slower than normal this evening, pt claiming to be feeling slow but steady. Returned to room without complaint or incident. Was left back on EOB with all needs met and call bell in reach.    Holland Falling Mobility Specialist Phone Number [phone]

## 2021-09-06 NOTE — Progress Notes (Signed)
This chaplain responded to Dr. Petra Kuba request for spiritual care. The chaplain met the Pt. in the hall and assisted him with charging his phone.   The Pt. accepted the chaplain's invitation for a listening presence at the Pt. bedside. The chaplain understands the Pt. discerns meaning in his life from the relationship with his friend-Torina. The Pt. is perseverating on life without Torina and his hope for forgiveness.   The chaplain listens as the Pt. explains his diagnosis of Stage 3 CKD. The Pt. verbalizes his desire not to have HD. The Pt. was not able to articulate an understanding of self care and his diagnosis.  The Pt. is hopeful the chaplain will re-visit.  Chaplain Sallyanne Kuster [phone]

## 2021-09-06 NOTE — Plan of Care (Signed)
  Problem: Activity: Goal: Risk for activity intolerance will decrease Outcome: Progressing   Problem: Nutrition: Goal: Adequate nutrition will be maintained Outcome: Progressing   Problem: Elimination: Goal: Will not experience complications related to urinary retention Outcome: Progressing   

## 2021-09-06 NOTE — Consult Note (Cosign Needed)
Consultation Note Date: 09/06/2021   Patient Name: [name]  DOB: 05/20/1955  MRN: [id number]  Age / Sex: 66 y.o., male  PCP: Pcp, No Referring Physician: Hosie Poisson, MD  Reason for Consultation: Establishing goals of care  HPI/Patient Profile: 66 y.o. male  with past medical history of HTN, depression, anxiety, anemia, postpolio syndrome, type 2 diabetes, CKD (stage III), HLD, prostate cancer, GERD, and macular degeneration admitted on 09/01/2021 with syncope and dizziness.  CT was negative for acute abnormality but revealed progressive atrophy and white matter disease, as well as chronic microvascular disease.  MRI was negative.  Orthostatics are negative.  Echo was normal with LV function WNL.  No valve issues.  Telemetry monitoring has revealed no arrhythmias.  Carotid artery Dopplers revealed minimal plaque.  Patient is suspected to have vertigo.  Current recommendations are for patient to be admitted to assisted living facility.  Palliative medicine team was consulted to discuss goals of care.   Clinical Assessment and Goals of Care: I have reviewed medical records including EPIC notes, labs and imaging, assessed the patient and then met with patient at bedside to discuss diagnosis prognosis, GOC, EOL wishes, disposition and options.  I introduced Palliative Medicine as specialized medical care for people living with serious illness. It focuses on providing relief from the symptoms and stress of a serious illness. The goal is to improve quality of life for both the patient and the family.  We discussed a brief life review of the patient.  Patient is 1 of 5 children, 1 sister passed during house fire and 1 passed from lung cancer.  He reports he is not close with his 2 sisters.  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  He speaks fondly of his "lady friend" [place].  As far as  functional and nutritional status patient reports he has had multiple falls but cannot recall triggering factors.  He endorses he does drink "cocktails and a beer" with his lady friend prior to some of his previous falls.  He reports he eats a chicken biscuit from [employer] and a [place] sandwich about 3:00 every afternoon.  He shares he has had a significant amount of weight loss over the past year plus.  He endorses poor appetite.  We discussed patient's current illness and what it means in the larger context of patient's on-going co-morbidities. I attempted to elicit values and goals of care important to the patient.  However, patient was perseverating on wanting to speak with, be with, and spend time with his lady friend [place].  He recalls several stories in which he regrets his behavior and treatment.  Patient becomes tearful when sharing the stories.  We discussed patient's PMH of anxiety, depression, and bipolar disorder.  Patient was in agreement that he is depressed but denies SI/HI.  He shares that he does not think he has much to live for since he is unable to be with or talk to [place].  I attempted to talk about  surrogate decision maker but patient continued to speak about his lady friend.  I attempted to address CODE STATUS and discuss goals of care.  However, patient was persistent and only wanted to discuss Tovina. When pressed about his wishes for surrogate decision maker or advanced care planning, patient shared he doesn't know, wants to think about it, and talk more about it later.   I suggested to patient that he focus on himself and his needs so that a plan of care with goals in line with his values and beliefs can be established.  Patient remained focused on only wanting to speak of Tovina but was agreeable to speaking with psychology for evaluation of depression/bipolar disorder as well as social worker to discuss assisted living facility.  Questions and concerns were addressed. The  patient was encouraged to call with questions or concerns.   Primary Decision Maker PATIENT  Code Status/Advance Care Planning: Full code  Prognosis:   Unable to determine  Discharge Planning: Assisted living facility  Primary Diagnoses: Present on Admission:  Microcytic anemia  H/O post-polio syndrome  Benign hypertension  Depressive disorder  Type 2 diabetes mellitus with diabetic nephropathy, without long-term current use of insulin (HCC)  Essential hypertension  Chronic kidney disease (CKD), stage III (moderate) (HCC)  HLD (hyperlipidemia)  Syncope and collapse   Physical Exam Vitals and nursing note reviewed.  Constitutional:      General: He is not in acute distress.    Appearance: Normal appearance. He is not ill-appearing or toxic-appearing.  HENT:     Head: Normocephalic and atraumatic.     Mouth/Throat:     Mouth: Mucous membranes are moist.  Eyes:     Pupils: Pupils are equal, round, and reactive to light.  Cardiovascular:     Pulses: Normal pulses.  Pulmonary:     Effort: Pulmonary effort is normal.  Abdominal:     Palpations: Abdomen is soft.  Musculoskeletal:        General: Normal range of motion.  Skin:    General: Skin is warm and dry.  Neurological:     Mental Status: He is alert and oriented to person, place, and time.     Palliative Assessment/Data: 80%     I discussed this patient's plan of care with patient, RN Alyson. Dr. Akula.  Thank you for this consult. Palliative medicine will continue to follow and assist holistically.   Time Total: 75 minutes Greater than 50%  of this time was spent counseling and coordinating care related to the above assessment and plan.  Signed by:  , DNP, [name]-BC Palliative Medicine    Please contact Palliative Medicine Team phone at [phone] for questions and concerns.  For individual provider: See Amion               

## 2021-09-06 NOTE — Progress Notes (Addendum)
Physical Therapy Treatment Patient Details Name: [name] MRN: [id number] DOB: [birth year] Today's Date: 09/06/2021   History of Present Illness 66 y.o. male presents to [facility] hospital on 09/01/2021 with dizziness and recent syncopal episodes. CT and MRI unremakrable. EEG negative for seizure   PMH includes DMII, CKD, HLD, HTN.    PT Comments    Pt admitted with above diagnosis. Pt was able to tolerate BPPV treatment with PT performing Semont, then Epley, then BBQ roll as PT suspects right posterior and horizontal canal BPPV.  Pt did feel better after treatment performed.  Pt able to ambulate and self correct balance with supervision however PT did encourage for pt to use RW at home as he tends to reach for furniture and walls at times and discussed safety with pt.  Of note, did not perform entire DGI however did aspects of it and pt did not lose balance today.  REcommend Outpt PT for vestibular rehab and discussed with CM.  Also messaged MD, CM and SW regarding pts concerns expressed about being hopeless and depressed.  Pt requesting to talk with SW.  MD ordering chaplain c/s and palliative care c/s.  Will continue to follow pt acutely.  Pt currently with functional limitations due to balance and endurance deficits. Pt will benefit from skilled PT to increase their independence and safety with mobility to allow discharge to the venue listed below.      Recommendations for follow up therapy are one component of a multi-disciplinary discharge planning process, led by the attending physician.  Recommendations may be updated based on patient status, additional functional criteria and insurance authorization.  Follow Up Recommendations  Outpatient PT (for vestibular rehab)     Assistance Recommended at Discharge PRN  Patient can return home with the following Direct supervision/assist for medications management;Direct supervision/assist for financial management;Assist for transportation;Help with stairs  or ramp for entrance   Equipment Recommendations  None recommended by PT    Recommendations for Other Services       Precautions / Restrictions Precautions Precautions: Fall Restrictions Weight Bearing Restrictions: No     Mobility  Bed Mobility Overal bed mobility: Needs Assistance Bed Mobility: Supine to Sit, Sit to Supine     Supine to sit: Independent Sit to supine: Independent   General bed mobility comments: very mild lightheadedness with supine to sit (brief); Pt did test positive for right posterior canal and horizontal canal BPPV and was treated for them both.  Given that it was possibly stuck, Semont performed, then Epley and then BBQ roll.  Did note that nystagmus was lessened at end of treatment.  Pt tolerated well overall.    Transfers Overall transfer level: Needs assistance Equipment used: None Transfers: Sit to/from Stand Sit to Stand: Supervision           General transfer comment: supervision for safety only due to reports of mild lightheadedness    Ambulation/Gait Ambulation/Gait assistance: Supervision Gait Distance (Feet): 150 Feet Assistive device: None Gait Pattern/deviations: Step-through pattern, Decreased stance time - right Gait velocity: reduced Gait velocity interpretation: <1.31 ft/sec, indicative of household ambulator   General Gait Details: pt with slowed step-through gait, reduced stance time on RLE; reports of lightheadedness persisted but had done all the treatment before the walk. Pt occasionally reaching for wall to steady but was able to self correct balance wihtout assit. Pt states he feels he is walking at baseline.   Stairs  Wheelchair Mobility    Modified Rankin (Stroke Patients Only)       Balance Overall balance assessment: Needs assistance Sitting-balance support: No upper extremity supported, Feet supported Sitting balance-Leahy Scale: Good     Standing balance support: No upper extremity  supported, During functional activity Standing balance-Leahy Scale: Good Standing balance comment: able to perform pericare in standing.  Pt was concerned that he had urinated on himself and asked for washclothes and performed pericare and PT assisted with changing pts gown.                            Cognition Arousal/Alertness: Awake/alert Behavior During Therapy: Flat affect Overall Cognitive Status: No family/caregiver present to determine baseline cognitive functioning                                 General Comments: slowed processing        Exercises      General Comments General comments (skin integrity, edema, etc.): VSS      Pertinent Vitals/Pain Pain Assessment Pain Assessment: No/denies pain    Home Living                          Prior Function            PT Goals (current goals can now be found in the care plan section) Acute Rehab PT Goals Patient Stated Goal: to reduce feelings of lightheadedness and reduce falls risk Progress towards PT goals: Progressing toward goals    Frequency    Min 3X/week      PT Plan Discharge plan needs to be updated    Co-evaluation              AM-PAC PT "6 Clicks" Mobility   Outcome Measure  Help needed turning from your back to your side while in a flat bed without using bedrails?: None Help needed moving from lying on your back to sitting on the side of a flat bed without using bedrails?: None Help needed moving to and from a bed to a chair (including a wheelchair)?: A Little Help needed standing up from a chair using your arms (e.g., wheelchair or bedside chair)?: A Little Help needed to walk in hospital room?: A Little Help needed climbing 3-5 steps with a railing? : A Little 6 Click Score: 20    End of Session Equipment Utilized During Treatment: Gait belt Activity Tolerance: Patient limited by fatigue (limited by dizziness) Patient left: in bed;with call  bell/phone within reach Nurse Communication: Mobility status PT Visit Diagnosis: Other abnormalities of gait and mobility (R26.89);Dizziness and giddiness (R42)     Time: 2725-3664 PT Time Calculation (min) (ACUTE ONLY): 48 min  Charges:  $Gait Training: 8-22 mins $Self Care/Home Management: 8-22 $Canalith Rep Proc: 8-22 mins                     [name],PT Acute Rehab Services [phone]    Alvira Philips 09/06/2021, 10:57 AM

## 2021-09-06 NOTE — Inpatient Diabetes Management (Addendum)
Inpatient Diabetes Program Recommendations  AACE/ADA: New Consensus Statement on Inpatient Glycemic Control (2015)  Target Ranges:  Prepandial:   less than 140 mg/dL      Peak postprandial:   less than 180 mg/dL (1-2 hours)      Critically ill patients:  140 - 180 mg/dL   Lab Results  Component Value Date   GLUCAP 204 (H) 09/06/2021   HGBA1C 11.0 (H) 09/04/2021    Review of Glycemic Control  Diabetes history: type 2 Outpatient Diabetes medications: Rybelsus 14 mg daily Current orders for Inpatient glycemic control: Semglee 15 units daily, Novolog 0-9 units TID correction scale and Novolog 0-5 units scale at HS.  Inpatient Diabetes Program Recommendations:   Spoke with patient at the bedside. Patient states that he was diagnosed with diabetes about 10-12 years ago. Has been on Rybelsus for about 2 years. States that he does take it just about every day. States that he is depressed and states that his body is giving out. He has stage 3 kidney disease, however, his GFR is 41. If his GFR is >30, he could be on a SGLT-2 such as [place] or [place]. Patient is not willing to take insulin at this time. He usually only eats one meal  a day at home where he lives alone. States that he has a home blood glucose meter, but does not know where it is. Discussed HgbA1C of 11%. States that he had been down to 8% in the past, but had not been taking care of himself lately. He states that he will be moving in the next month, but does not know where yet.  He does need a PCP to follow his glucose control. Assisted living situation would be ideal for him and he seems agreeable to the idea. Physician to have social worker talk with patient.  Will continue to monitor blood sugars while in the hospital.  Harvel Ricks RN BSN CDE Diabetes Coordinator Pager: [phone]  8am-5pm

## 2021-09-07 DIAGNOSIS — R55 Syncope and collapse: Secondary | ICD-10-CM | POA: Diagnosis not present

## 2021-09-07 DIAGNOSIS — N1831 Chronic kidney disease, stage 3a: Secondary | ICD-10-CM | POA: Diagnosis not present

## 2021-09-07 DIAGNOSIS — F329 Major depressive disorder, single episode, unspecified: Secondary | ICD-10-CM | POA: Diagnosis not present

## 2021-09-07 DIAGNOSIS — R42 Dizziness and giddiness: Secondary | ICD-10-CM | POA: Diagnosis not present

## 2021-09-07 DIAGNOSIS — Z515 Encounter for palliative care: Secondary | ICD-10-CM | POA: Diagnosis not present

## 2021-09-07 DIAGNOSIS — I10 Essential (primary) hypertension: Secondary | ICD-10-CM | POA: Diagnosis not present

## 2021-09-07 LAB — GLUCOSE, CAPILLARY
Glucose-Capillary: 195 mg/dL — ABNORMAL HIGH (ref 70–99)
Glucose-Capillary: 193 mg/dL — ABNORMAL HIGH (ref 70–99)
Glucose-Capillary: 243 mg/dL — ABNORMAL HIGH (ref 70–99)
Glucose-Capillary: 359 mg/dL — ABNORMAL HIGH (ref 70–99)

## 2021-09-07 MED ORDER — GUAIFENESIN-DM 100-10 MG/5ML PO SYRP
15.0000 mL | ORAL_SOLUTION | ORAL | Status: DC | PRN
  Administered 2021-09-07: 15 mL via ORAL
  Filled 2021-09-07: qty 15

## 2021-09-07 MED ORDER — ATORVASTATIN CALCIUM 40 MG PO TABS: 40.0000 mg | ORAL_TABLET | Freq: Every day | ORAL | 1 refills | Status: DC

## 2021-09-07 MED ORDER — SERTRALINE HCL 25 MG PO TABS: 25.0000 mg | ORAL_TABLET | Freq: Every day | ORAL | 0 refills | Status: DC

## 2021-09-07 MED ORDER — SERTRALINE HCL 50 MG PO TABS
25.0000 mg | ORAL_TABLET | Freq: Every day | ORAL | Status: DC
Start: 2021-09-07 — End: 2021-09-10
  Administered 2021-09-07 – 2021-09-10 (×4): 25 mg via ORAL
  Filled 2021-09-07 (×4): qty 1

## 2021-09-07 MED ORDER — FINASTERIDE 5 MG PO TABS: 5.0000 mg | ORAL_TABLET | Freq: Every day | ORAL | 0 refills | Status: DC

## 2021-09-07 MED ORDER — HYDROXYZINE HCL 25 MG PO TABS
25.0000 mg | ORAL_TABLET | Freq: Three times a day (TID) | ORAL | Status: DC | PRN
Start: 2021-09-07 — End: 2021-09-11
  Administered 2021-09-07 – 2021-09-10 (×5): 25 mg via ORAL
  Filled 2021-09-07 (×5): qty 1
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
## 2021-09-08 DIAGNOSIS — R55 Syncope and collapse: Secondary | ICD-10-CM | POA: Diagnosis not present

## 2021-09-08 DIAGNOSIS — I10 Essential (primary) hypertension: Secondary | ICD-10-CM | POA: Diagnosis not present

## 2021-09-08 DIAGNOSIS — R42 Dizziness and giddiness: Secondary | ICD-10-CM | POA: Diagnosis not present

## 2021-09-08 DIAGNOSIS — N1831 Chronic kidney disease, stage 3a: Secondary | ICD-10-CM | POA: Diagnosis not present

## 2021-09-08 LAB — GLUCOSE, CAPILLARY
Glucose-Capillary: 160 mg/dL — ABNORMAL HIGH (ref 70–99)
Glucose-Capillary: 179 mg/dL — ABNORMAL HIGH (ref 70–99)
Glucose-Capillary: 326 mg/dL — ABNORMAL HIGH (ref 70–99)
Glucose-Capillary: 346 mg/dL — ABNORMAL HIGH (ref 70–99)

## 2021-09-09 DIAGNOSIS — I129 Hypertensive chronic kidney disease with stage 1 through stage 4 chronic kidney disease, or unspecified chronic kidney disease: Secondary | ICD-10-CM | POA: Diagnosis present

## 2021-09-09 DIAGNOSIS — F32A Depression, unspecified: Secondary | ICD-10-CM | POA: Diagnosis not present

## 2021-09-09 DIAGNOSIS — E785 Hyperlipidemia, unspecified: Secondary | ICD-10-CM | POA: Diagnosis not present

## 2021-09-09 DIAGNOSIS — F0394 Unspecified dementia, unspecified severity, with anxiety: Secondary | ICD-10-CM | POA: Diagnosis present

## 2021-09-09 DIAGNOSIS — Z515 Encounter for palliative care: Secondary | ICD-10-CM | POA: Diagnosis not present

## 2021-09-09 DIAGNOSIS — G904 Autonomic dysreflexia: Secondary | ICD-10-CM | POA: Diagnosis present

## 2021-09-09 DIAGNOSIS — G14 Postpolio syndrome: Secondary | ICD-10-CM | POA: Diagnosis present

## 2021-09-09 DIAGNOSIS — D750 Familial erythrocytosis: Secondary | ICD-10-CM | POA: Diagnosis not present

## 2021-09-09 DIAGNOSIS — L299 Pruritus, unspecified: Secondary | ICD-10-CM | POA: Diagnosis not present

## 2021-09-09 DIAGNOSIS — R42 Dizziness and giddiness: Secondary | ICD-10-CM | POA: Diagnosis not present

## 2021-09-09 DIAGNOSIS — H3530 Unspecified macular degeneration: Secondary | ICD-10-CM | POA: Diagnosis not present

## 2021-09-09 DIAGNOSIS — E1165 Type 2 diabetes mellitus with hyperglycemia: Secondary | ICD-10-CM | POA: Diagnosis not present

## 2021-09-09 DIAGNOSIS — R404 Transient alteration of awareness: Secondary | ICD-10-CM | POA: Diagnosis not present

## 2021-09-09 DIAGNOSIS — E1140 Type 2 diabetes mellitus with diabetic neuropathy, unspecified: Secondary | ICD-10-CM | POA: Diagnosis present

## 2021-09-09 DIAGNOSIS — E1121 Type 2 diabetes mellitus with diabetic nephropathy: Secondary | ICD-10-CM | POA: Diagnosis not present

## 2021-09-09 DIAGNOSIS — Z7401 Bed confinement status: Secondary | ICD-10-CM | POA: Diagnosis not present

## 2021-09-09 DIAGNOSIS — N1831 Chronic kidney disease, stage 3a: Secondary | ICD-10-CM | POA: Diagnosis not present

## 2021-09-09 DIAGNOSIS — R9082 White matter disease, unspecified: Secondary | ICD-10-CM | POA: Diagnosis present

## 2021-09-09 DIAGNOSIS — K219 Gastro-esophageal reflux disease without esophagitis: Secondary | ICD-10-CM | POA: Diagnosis present

## 2021-09-09 DIAGNOSIS — F419 Anxiety disorder, unspecified: Secondary | ICD-10-CM | POA: Diagnosis present

## 2021-09-09 DIAGNOSIS — I10 Essential (primary) hypertension: Secondary | ICD-10-CM | POA: Diagnosis not present

## 2021-09-09 DIAGNOSIS — D751 Secondary polycythemia: Secondary | ICD-10-CM | POA: Diagnosis present

## 2021-09-09 DIAGNOSIS — E669 Obesity, unspecified: Secondary | ICD-10-CM | POA: Diagnosis present

## 2021-09-09 DIAGNOSIS — M109 Gout, unspecified: Secondary | ICD-10-CM | POA: Diagnosis present

## 2021-09-09 DIAGNOSIS — R55 Syncope and collapse: Secondary | ICD-10-CM | POA: Diagnosis not present

## 2021-09-09 DIAGNOSIS — W19XXXA Unspecified fall, initial encounter: Secondary | ICD-10-CM | POA: Diagnosis not present

## 2021-09-09 DIAGNOSIS — D509 Iron deficiency anemia, unspecified: Secondary | ICD-10-CM | POA: Diagnosis not present

## 2021-09-09 DIAGNOSIS — R5383 Other fatigue: Secondary | ICD-10-CM | POA: Diagnosis not present

## 2021-09-09 DIAGNOSIS — G4700 Insomnia, unspecified: Secondary | ICD-10-CM | POA: Diagnosis not present

## 2021-09-09 DIAGNOSIS — M6281 Muscle weakness (generalized): Secondary | ICD-10-CM | POA: Diagnosis not present

## 2021-09-09 LAB — GLUCOSE, CAPILLARY
Glucose-Capillary: 176 mg/dL — ABNORMAL HIGH (ref 70–99)
Glucose-Capillary: 157 mg/dL — ABNORMAL HIGH (ref 70–99)
Glucose-Capillary: 282 mg/dL — ABNORMAL HIGH (ref 70–99)
Glucose-Capillary: 239 mg/dL — ABNORMAL HIGH (ref 70–99)

## 2021-09-09 LAB — CREATININE, SERUM
Creatinine, Ser: 1.58 mg/dL — ABNORMAL HIGH (ref 0.61–1.24)
GFR, Estimated: 48 mL/min — ABNORMAL LOW (ref >60)

## 2021-09-09 NOTE — Progress Notes (Signed)
[facility]                                                                               [name], is a 66 y.o. male, DOB - June 13, 1955, HQI:[id number] Admit date - 09/01/2021    Outpatient Primary MD for the patient is Pcp, No  LOS - 0  days    Brief summary   [name] is a 66 y.o. male with medical history significant of anemia, history of postpolio syndrome, hypertension, depression, anxiety, insomnia, diabetes, CKD 3, hyperlipidemia, prostate cancer, GERD, macular degeneration presenting with dizziness. CT head showed no acute abnormality but demonstrate progressive atrophy and white matter disease consistent with chronic microvascular disease.  MRI of the brain showed no acute abnormality,.   He was admitted for evaluation of syncope and vertigo and some lightheadedness. So far work up has been negative. Patient continues to have dizziness,  balance deficits and memory deficits, non compliance to meds at home, and had a fall during this hospitalization. He lives by himself, does not have family to supervise,. He is not safe for discharge home , would benefit from SNF, and possible transition to memory care Unit / ALF. No new complaints overnight.      Assessment & Plan    Assessment and Plan:  Intermittent dizziness and Syncope:  Differential include autonomic dysregulation, neuropathy from DM vs neurological etiology vs arrhythmias.  CT head showed no acute abnormality but demonstrate progressive atrophy and white matter disease consistent with chronic microvascular disease.  MRI of the brain showed no acute abnormality,. Orthostatic vital signs are negative.  Echocardiogram showed preserved LVef and grade 1 diastolic dysfunction. No regional wall abnormalities.  Carotid duplex is unremarkable.  EEG ordered and is negative for active seizures. Telemetry did not show any arrhythmias.  Another fall on the 21st, due to balance deficits and intermittent  dizziness.  cardiology consulted for evaluation of presyncope and syncope, recommended no further work up at this time. But patient has another fall this admission. Repeat CT head and hip x rays are negative.  But pt continues to have intermittent dizziness and forgetfulness, not safe for discharge home. Therapy evaluations recommending SNF, and he would benefit from possible transition to Memory care unit / ALF.  SLP eval for cognitive evaluation requested and recommendations given.     Dizziness:  Not resolved despite, iv fluids, and meclizine, associated with balance impairment possibly from diabetic neuropathy.  Also is taking several medications that have association with gait abnormality and dizziness.  Abilify has been associated with gait abnormality and less than 1% of cases, trazodone associated with dizziness and 20 to 28% of cases, alfuzosin associated with dizziness as well. Vitamin b12 levels wnl, RPR is negative.  Folate wnl.  Recommend outpatient follow up with Neurology.     Hypertension:  BP parameters are optimal.  Orthostatic vital signs are negative.    Depression, anxiety  Holding abilify and trazodone in the setting of dizziness.  Pt reports being depressed , teary and  reports feeling alone.  Requested psychiatry consult for medication assistance.  No suicidal ideations.  Added zoloft for depression and atarax for bouts of  anxiety.     Stage 3a CKD Creatinine stable     Type 2 DM uncontrolled with hyperglycemia. Not on insulin at home. Pt uses semaglutide at home.  CBG (last 3)  Recent Labs    09/08/21 2214 09/09/21 0659 09/09/21 1136  GLUCAP 346* 176* 157*   A1c is 11, started him on Semglee 10 units daily, increased to 15 units daily.  Resume SSI.  Will need education on the diet.  Added 2 units of Novolog TIDAC. No changes in medications.    H/o prostate cancer:  Continue with finasteride.    Hyperlipidemia:  Continue with lipitor.      Erythrocytosis:  Of unclear etiology. No Thrombocytosis.  Will need further work up with Hematology as outpatient.    Pt is deconditioned, had severe episodes of syncope and pre syncopal episodes, along with vertigo, non compliant to meds , depression, poor prognosis, requested palliative care consult and psychiatry consult and TOC for evaluation of safe disposition , SNF .  Estimated body mass index is 27.94 kg/m as calculated from the following:   Height as of this encounter: 5\' 6"  (1.676 m).   Weight as of this encounter: 78.5 kg.  Code Status: full code.  DVT Prophylaxis:  enoxaparin (LOVENOX) injection 40 mg Start: 09/02/21 1000   Level of Care: Level of care: Telemetry Medical Family Communication: None at bedside.   Disposition Plan:     Remains inpatient appropriate: SNF placement, pt is medically stable for discharge.   Procedures:  MRI brain Carotid duplex EEG.  Echocardiogram.   Consultants:   Cardiology Palliative care Psychiatry.   Antimicrobials:   Anti-infectives (From admission, onward)    None        Medications  Scheduled Meds:  aspirin EC  81 mg Oral Q breakfast   atorvastatin  40 mg Oral Daily   enoxaparin (LOVENOX) injection  40 mg Subcutaneous Daily   finasteride  5 mg Oral Daily   insulin aspart  0-5 Units Subcutaneous QHS   insulin aspart  0-9 Units Subcutaneous TID WC   insulin glargine-yfgn  15 Units Subcutaneous Daily   sertraline  25 mg Oral Daily   sodium chloride flush  3 mL Intravenous Q12H   Continuous Infusions: PRN Meds:.acetaminophen **OR** acetaminophen, guaiFENesin-dextromethorphan, hydrOXYzine, meclizine, polyethylene glycol, tetrahydrozoline, traZODone    Subjective:   [name] was seen and examined today.   No Events overnight.   Vitals:   09/08/21 1547 09/08/21 2033 09/09/21 0657 09/09/21 0851  BP: (!) 159/100 (!) 142/93 (!) 138/92 (!) 147/87  Pulse: 74 77 61 62  Resp: 19 18 18 16   Temp: 98.6 F  (37 C) 98.3 F (36.8 C) 98.4 F (36.9 C) 98.5 F (36.9 C)  TempSrc: Oral Oral Oral Oral  SpO2: 97% 98% 98% 99%  Weight:      Height:        Intake/Output Summary (Last 24 hours) at 09/09/2021 1419 Last data filed at 09/09/2021 0900 Gross per 24 hour  Intake 700 ml  Output 1000 ml  Net -300 ml    Filed Weights   09/05/21 0500 09/06/21 0500 09/07/21 0500  Weight: 74 kg 74.2 kg 78.5 kg     Exam General exam: Appears calm and comfortable  Respiratory system: Clear to auscultation. Respiratory effort normal. Cardiovascular system: S1 & S2 heard, RRR. No JVD,  No pedal edema. Gastrointestinal system: Abdomen is nondistended, soft and nontender. Normal bowel sounds heard. Central nervous system: Alert and oriented to person and  place only, no focal deficits.  Extremities: Symmetric 5 x 5 power. Skin: No rashes,  Psychiatry: pt appears depressed.       Data Reviewed:  I have personally reviewed following labs and imaging studies   CBC Lab Results  Component Value Date   WBC 6.7 09/05/2021   RBC 6.18 (H) 09/05/2021   HGB 18.0 (H) 09/05/2021   HCT 54.4 (H) 09/05/2021   MCV 88.0 09/05/2021   MCH 29.1 09/05/2021   PLT 201 09/05/2021   MCHC 33.1 09/05/2021   RDW 12.7 09/05/2021   LYMPHSABS 1.6 09/05/2021   MONOABS 0.5 09/05/2021   EOSABS 0.2 09/05/2021   BASOSABS 0.1 09/05/2021     Last metabolic panel Lab Results  Component Value Date   NA 138 09/05/2021   K 4.5 09/05/2021   CL 101 09/05/2021   CO2 27 09/05/2021   BUN 43 (H) 09/05/2021   CREATININE 1.58 (H) 09/09/2021   GLUCOSE 256 (H) 09/05/2021   GFRNONAA 48 (L) 09/09/2021   GFRAA 48 (L) 03/13/2020   CALCIUM 9.8 09/05/2021   PHOS 4.4 10/27/2020   PROT 6.5 09/02/2021   ALBUMIN 3.3 (L) 09/02/2021   LABGLOB 2.6 07/17/2020   AGRATIO 1.6 07/17/2020   BILITOT 0.8 09/02/2021   ALKPHOS 93 09/02/2021   AST 15 09/02/2021   ALT 19 09/02/2021   ANIONGAP 10 09/05/2021    CBG (last 3)  Recent Labs     09/08/21 2214 09/09/21 0659 09/09/21 1136  GLUCAP 346* 176* 157*       Coagulation Profile: No results for input(s): "INR", "PROTIME" in the last 168 hours.   Radiology Studies: No results found.     Kathlen Mody M.D. [facility] 09/09/2021, 2:19 PM  Available via Epic secure chat 7am-7pm After 7 pm, please refer to night coverage provider listed on amion.

## 2021-09-10 DIAGNOSIS — R55 Syncope and collapse: Secondary | ICD-10-CM | POA: Diagnosis not present

## 2021-09-10 LAB — GLUCOSE, CAPILLARY
Glucose-Capillary: 222 mg/dL — ABNORMAL HIGH (ref 70–99)
Glucose-Capillary: 272 mg/dL — ABNORMAL HIGH (ref 70–99)
Glucose-Capillary: 276 mg/dL — ABNORMAL HIGH (ref 70–99)
Glucose-Capillary: 141 mg/dL — ABNORMAL HIGH (ref 70–99)

## 2021-09-10 MED ORDER — SERTRALINE HCL 50 MG PO TABS
50.0000 mg | ORAL_TABLET | Freq: Every day | ORAL | Status: DC
  Administered 2021-09-11: 50 mg via ORAL
  Filled 2021-09-10: qty 1

## 2021-09-10 MED ORDER — INSULIN ASPART 100 UNIT/ML IJ SOLN
4.0000 [IU] | Freq: Three times a day (TID) | INTRAMUSCULAR | Status: DC
  Administered 2021-09-10 – 2021-09-11 (×3): 4 [IU] via SUBCUTANEOUS

## 2021-09-10 MED ORDER — INSULIN GLARGINE-YFGN 100 UNIT/ML ~~LOC~~ SOLN
18.0000 [IU] | Freq: Every day | SUBCUTANEOUS | Status: DC
  Administered 2021-09-10 – 2021-09-11 (×2): 18 [IU] via SUBCUTANEOUS
  Filled 2021-09-10 (×2): qty 0.18

## 2021-09-10 NOTE — Progress Notes (Signed)
  Mobility Specialist Criteria Algorithm Info.    09/10/21 1434  Mobility  Activity Dangled on edge of bed;Refused mobility  Range of Motion/Exercises Active;All extremities   Patient asleep upon entry but easily aroused. Deferred hallway ambulation at this time for unspecified reasons. Did c/o slight lightheadedness. Will f/u as time permits.  09/10/2021 2:35 PM  [place] Brayton Baumgartner, CMS, BS EXP Acute Rehabilitation Services  Phone:[phone] Office: [phone]

## 2021-09-10 NOTE — Progress Notes (Signed)
Physical Therapy Treatment Note  Patient reports no improvement in lightheadedness or "drunk feeling" since having vestibular treatments. See below for BPPV assessment this date with no nystagmus noted but pt with symptoms of lightheadedness lasting more than one minute (several minutes). No BPPV interventions attempted as pt without nystagmus and denies that he has had any benefit from previous treatments.  Patient's Case Manager arrived and pt requested to defer ambulation at this time as he had an urgent situation to discuss with her. Will attempt to return later today for gait assessment. Encouraged pt to use RW when up walking and he continues to refuse stating it "makes me feel like an old man."    09/10/21 1041  Symptom Behavior  Subjective history of current problem pt reports lightheadedness with sitting still and with movement; later states he feels drunk when he walks  Type of Dizziness  Lightheadedness;Comment ("drunk")  Frequency of Dizziness daily  Duration of Dizziness varies--usually minutes  Symptom Nature Motion provoked;Variable  Aggravating Factors Spontaneous onset;Supine to sit;Sit to stand;Forward bending  Relieving Factors No known relieving factors  Progression of Symptoms No change since onset  History of similar episodes reports he's been having these problems for years  Oculomotor Exam  Oculomotor Alignment Normal  Spontaneous Absent  Positional Testing  Dix-Hallpike Dix-Hallpike Right  Horizontal Canal Testing Horizontal Canal Right;Horizontal Canal Left  Dix-Hallpike Right  Dix-Hallpike Right Duration > 60 seconds, pt demanding to come up out of position  Dix-Hallpike Right Symptoms No nystagmus  Horizontal Canal Right  Horizontal Canal Right Duration 0  Horizontal Canal Right Symptoms Normal  Horizontal Canal Left  Horizontal Canal Left Duration 0  Horizontal Canal Left Symptoms Normal     09/10/21 1000  PT Visit Information  Last PT Received On  09/10/21  PT Time Calculation  PT Start Time (ACUTE ONLY) 1015  PT Stop Time (ACUTE ONLY) 1034  PT Time Calculation (min) (ACUTE ONLY) 19 min  PT General Charges  $$ ACUTE PT VISIT 1 Visit  PT Treatments  $Therapeutic Activity 8-22 mins   Jerolyn Center, PT Acute Rehabilitation Services  Office [phone]

## 2021-09-10 NOTE — TOC Progression Note (Signed)
Transition of Care [facility]) - Progression Note    Patient Details  Name: [name] MRN: [id number] Date of Birth: 1955-12-12  Transition of Care [facility]) CM/SW Contact  Tom-Johnson, Hershal Coria, RN Phone Number: 09/10/2021, 11:03 AM  Clinical Narrative:     Patient requests a letter form MD stating he is in the hospital as he has a court appearance tomorrow 09/11/21 and will not be able to attend. MD wrote letter and CM faxed to [facility] [phone]) with successful notice received. Patient given a copy of the letter. CM will continue to follow with needs.   Expected Discharge Plan: Home w Home Health Services Barriers to Discharge: Continued Medical Work up  Expected Discharge Plan and Services Expected Discharge Plan: Home w Home Health Services   Discharge Planning Services: CM Consult Post Acute Care Choice: Home Health Living arrangements for the past 2 months: Apartment Expected Discharge Date: 09/07/21               DME Arranged: Tub bench DME Agency: [employer] Date DME Agency Contacted: 09/03/21 Time DME Agency Contacted: 1520 Representative spoke with at DME Agency: Arnold Long HH Arranged:  (Unable to get ome health. Outpatient instead.) HH Agency: NA         Social Determinants of Health (SDOH) Interventions    Readmission Risk Interventions    10/05/2019    4:42 PM  Readmission Risk Prevention Plan  Transportation Screening Complete  PCP or Specialist Appt within 5-7 Days Complete  Home Care Screening Complete  Medication Review (RN CM) Complete

## 2021-09-10 NOTE — TOC Progression Note (Signed)
Transition of Care [facility]) - Progression Note    Patient Details  Name: ARMAAN RICHEL MRN: [id number] Date of Birth: 1955-07-16  Transition of Care [facility]) CM/SW Contact  Eduard Roux, [place] Phone Number: 09/10/2021, 6:29 PM  Clinical Narrative:     Received authorization referral [id number] good from 06/26-06/28. Patient SNF choice is [facility].  [facility] confirmed bed offer and can admit patient tomorrow.  TOC will continue to follow and assist with discharge planning.  Antony Blackbird, MSW, LCSW Clinical Social Worker    Expected Discharge Plan: Skilled Nursing Facility    Expected Discharge Plan and Services Expected Discharge Plan: Skilled Nursing Facility   Discharge Planning Services: CM Consult Post Acute Care Choice: Home Health Living arrangements for the past 2 months: Apartment Expected Discharge Date: 09/07/21               DME Arranged: Tub bench DME Agency: [employer] Date DME Agency Contacted: 09/03/21 Time DME Agency Contacted: 1520 Representative spoke with at DME Agency: Arnold Long HH Arranged:  (Unable to get ome health. Outpatient instead.) HH Agency: NA         Social Determinants of Health (SDOH) Interventions    Readmission Risk Interventions    10/05/2019    4:42 PM  Readmission Risk Prevention Plan  Transportation Screening Complete  PCP or Specialist Appt within 5-7 Days Complete  Home Care Screening Complete  Medication Review (RN CM) Complete

## 2021-09-11 DIAGNOSIS — I129 Hypertensive chronic kidney disease with stage 1 through stage 4 chronic kidney disease, or unspecified chronic kidney disease: Secondary | ICD-10-CM | POA: Diagnosis not present

## 2021-09-11 DIAGNOSIS — E7800 Pure hypercholesterolemia, unspecified: Secondary | ICD-10-CM | POA: Diagnosis not present

## 2021-09-11 DIAGNOSIS — N1831 Chronic kidney disease, stage 3a: Secondary | ICD-10-CM | POA: Diagnosis not present

## 2021-09-11 DIAGNOSIS — E1121 Type 2 diabetes mellitus with diabetic nephropathy: Secondary | ICD-10-CM | POA: Diagnosis not present

## 2021-09-11 DIAGNOSIS — R55 Syncope and collapse: Secondary | ICD-10-CM | POA: Diagnosis not present

## 2021-09-11 DIAGNOSIS — D750 Familial erythrocytosis: Secondary | ICD-10-CM | POA: Diagnosis not present

## 2021-09-11 DIAGNOSIS — G4700 Insomnia, unspecified: Secondary | ICD-10-CM | POA: Diagnosis not present

## 2021-09-11 DIAGNOSIS — Z79899 Other long term (current) drug therapy: Secondary | ICD-10-CM | POA: Diagnosis not present

## 2021-09-11 DIAGNOSIS — E1165 Type 2 diabetes mellitus with hyperglycemia: Secondary | ICD-10-CM | POA: Diagnosis not present

## 2021-09-11 DIAGNOSIS — R5383 Other fatigue: Secondary | ICD-10-CM | POA: Diagnosis not present

## 2021-09-11 DIAGNOSIS — E785 Hyperlipidemia, unspecified: Secondary | ICD-10-CM | POA: Diagnosis not present

## 2021-09-11 DIAGNOSIS — Z7401 Bed confinement status: Secondary | ICD-10-CM | POA: Diagnosis not present

## 2021-09-11 DIAGNOSIS — R0789 Other chest pain: Secondary | ICD-10-CM | POA: Diagnosis not present

## 2021-09-11 DIAGNOSIS — M25562 Pain in left knee: Secondary | ICD-10-CM | POA: Diagnosis not present

## 2021-09-11 DIAGNOSIS — Z743 Need for continuous supervision: Secondary | ICD-10-CM | POA: Diagnosis not present

## 2021-09-11 DIAGNOSIS — D509 Iron deficiency anemia, unspecified: Secondary | ICD-10-CM | POA: Diagnosis not present

## 2021-09-11 DIAGNOSIS — L299 Pruritus, unspecified: Secondary | ICD-10-CM | POA: Diagnosis not present

## 2021-09-11 DIAGNOSIS — H3530 Unspecified macular degeneration: Secondary | ICD-10-CM | POA: Diagnosis not present

## 2021-09-11 DIAGNOSIS — R079 Chest pain, unspecified: Secondary | ICD-10-CM | POA: Diagnosis not present

## 2021-09-11 DIAGNOSIS — R404 Transient alteration of awareness: Secondary | ICD-10-CM | POA: Diagnosis not present

## 2021-09-11 DIAGNOSIS — R519 Headache, unspecified: Secondary | ICD-10-CM | POA: Diagnosis not present

## 2021-09-11 DIAGNOSIS — Z7982 Long term (current) use of aspirin: Secondary | ICD-10-CM | POA: Diagnosis not present

## 2021-09-11 DIAGNOSIS — I499 Cardiac arrhythmia, unspecified: Secondary | ICD-10-CM | POA: Diagnosis not present

## 2021-09-11 DIAGNOSIS — F32A Depression, unspecified: Secondary | ICD-10-CM | POA: Diagnosis not present

## 2021-09-11 DIAGNOSIS — M6281 Muscle weakness (generalized): Secondary | ICD-10-CM | POA: Diagnosis not present

## 2021-09-11 DIAGNOSIS — R6889 Other general symptoms and signs: Secondary | ICD-10-CM | POA: Diagnosis not present

## 2021-09-11 DIAGNOSIS — I10 Essential (primary) hypertension: Secondary | ICD-10-CM | POA: Diagnosis not present

## 2021-09-11 LAB — GLUCOSE, CAPILLARY
Glucose-Capillary: 172 mg/dL — ABNORMAL HIGH (ref 70–99)
Glucose-Capillary: 183 mg/dL — ABNORMAL HIGH (ref 70–99)

## 2021-09-11 MED ORDER — SERTRALINE HCL 50 MG PO TABS: 50.0000 mg | ORAL_TABLET | Freq: Every day | ORAL | Status: DC

## 2021-09-11 MED ORDER — INSULIN ASPART 100 UNIT/ML IJ SOLN: 4.0000 [IU] | Freq: Three times a day (TID) | INTRAMUSCULAR | 11 refills | Status: DC

## 2021-09-11 MED ORDER — INSULIN GLARGINE-YFGN 100 UNIT/ML ~~LOC~~ SOLN: 20.0000 [IU] | Freq: Every day | SUBCUTANEOUS | 11 refills | Status: DC

## 2021-09-11 MED ORDER — INSULIN GLARGINE-YFGN 100 UNIT/ML ~~LOC~~ SOLN
20.0000 [IU] | Freq: Every day | SUBCUTANEOUS | Status: DC
Start: 2021-09-12 — End: 2021-09-11

## 2021-09-11 MED ORDER — HYDROXYZINE HCL 25 MG PO TABS: 25.0000 mg | ORAL_TABLET | Freq: Three times a day (TID) | ORAL | 0 refills | Status: DC | PRN

## 2021-09-11 NOTE — Progress Notes (Signed)
Physical Therapy Treatment Patient Details Name: [name] MRN: [id number] DOB: [birth year] Today's Date: 09/11/2021   History of Present Illness 66 y.o. male presents to [facility] hospital on 09/01/2021 with dizziness and recent syncopal episodes. CT and MRI unremakrable. EEG negative for seizure   PMH includes DMII, CKD, HLD, HTN.    PT Comments    Pt reluctantly agrees to working with therapy. Pt reports he has so many things going wrong that he can't remember what he likes to do. Pt continues to refuse RW for stability with ambulation and continues to have minor LoB with ambulation, using handrail in hallway for assistance. No outside assistance provided today. Pt participates in limited balance training before reporting dizziness and requests for therapy to be over. Pt reports going to SNF this afternoon, but doesn't know how he feels about it.      Recommendations for follow up therapy are one component of a multi-disciplinary discharge planning process, led by the attending physician.  Recommendations may be updated based on patient status, additional functional criteria and insurance authorization.  Follow Up Recommendations  Skilled nursing-short term rehab (<3 hours/day) (for vestibular rehab and transition to a memory care unit or to A living) Can patient physically be transported by private vehicle: Yes   Assistance Recommended at Discharge PRN  Patient can return home with the following Direct supervision/assist for medications management;Direct supervision/assist for financial management;Assist for transportation;Help with stairs or ramp for entrance   Equipment Recommendations  None recommended by PT    Recommendations for Other Services       Precautions / Restrictions Precautions Precautions: Fall Restrictions Weight Bearing Restrictions: No     Mobility  Bed Mobility Overal bed mobility: Needs Assistance Bed Mobility: Supine to Sit, Sit to Supine     Supine to  sit: Independent Sit to supine: Independent        Transfers Overall transfer level: Needs assistance Equipment used: None Transfers: Sit to/from Stand Sit to Stand: Supervision           General transfer comment: fall risk    Ambulation/Gait Ambulation/Gait assistance: Min guard, Min assist Gait Distance (Feet): 200 Feet Assistive device: None Gait Pattern/deviations: Step-through pattern, Decreased stance time - right, Staggering left, Staggering right, Scissoring Gait velocity: reduced Gait velocity interpretation: <1.31 ft/sec, indicative of household ambulator   General Gait Details: slowed unsteady gait, reaching out for assist from L hand rail in hallway for steadying, refuses use of RW for stability       Balance Overall balance assessment: Needs assistance Sitting-balance support: No upper extremity supported, Feet supported Sitting balance-Leahy Scale: Good     Standing balance support: No upper extremity supported, During functional activity Standing balance-Leahy Scale: Fair Standing balance comment: Pt loses balance with challenges and at times reaches for furniture.               High Level Balance Comments: attempted to work on [employer] balance able to tolerate for 20 sec before reaching out for assist, afterward reports dizziness and requests to be done with therapy            Cognition Arousal/Alertness: Awake/alert Behavior During Therapy: Flat affect, Impulsive Overall Cognitive Status: Impaired/Different from baseline Area of Impairment: Safety/judgement, Problem solving, Attention, Following commands, Memory                   Current Attention Level: Sustained Memory: Decreased short-term memory Following Commands: Follows one step commands with increased time, Follows one  step commands inconsistently Safety/Judgement: Decreased awareness of safety, Decreased awareness of deficits   Problem Solving: Slow processing General  Comments: slowed processing and defeatist look on progress being made           General Comments General comments (skin integrity, edema, etc.): VSS on RA      Pertinent Vitals/Pain Pain Assessment Pain Assessment: Faces Faces Pain Scale: Hurts a little bit Pain Location: back Pain Descriptors / Indicators: Discomfort, Guarding Pain Intervention(s): Limited activity within patient's tolerance, Monitored during session, Repositioned     PT Goals (current goals can now be found in the care plan section) Acute Rehab PT Goals PT Goal Formulation: With patient Time For Goal Achievement: 09/16/21 Potential to Achieve Goals: Fair Progress towards PT goals: Progressing toward goals    Frequency    Min 3X/week      PT Plan Discharge plan needs to be updated       AM-PAC PT "6 Clicks" Mobility   Outcome Measure  Help needed turning from your back to your side while in a flat bed without using bedrails?: None Help needed moving from lying on your back to sitting on the side of a flat bed without using bedrails?: None Help needed moving to and from a bed to a chair (including a wheelchair)?: A Little Help needed standing up from a chair using your arms (e.g., wheelchair or bedside chair)?: A Little Help needed to walk in hospital room?: A Little Help needed climbing 3-5 steps with a railing? : A Lot 6 Click Score: 19    End of Session Equipment Utilized During Treatment: Gait belt Activity Tolerance: Patient limited by fatigue (limited by dizziness) Patient left: in bed;with call bell/phone within reach;with bed alarm set Nurse Communication: Mobility status PT Visit Diagnosis: Other abnormalities of gait and mobility (R26.89);Dizziness and giddiness (R42)     Time: 1610-9604 PT Time Calculation (min) (ACUTE ONLY): 14 min  Charges:  $Therapeutic Exercise: 8-22 mins                     [name] B. Beverely Risen PT, DPT Acute Rehabilitation Services Please use secure  chat or  Call Office [phone]    [facility] 09/11/2021, 11:07 AM

## 2021-09-12 DIAGNOSIS — M6281 Muscle weakness (generalized): Secondary | ICD-10-CM | POA: Diagnosis not present

## 2021-09-12 DIAGNOSIS — R55 Syncope and collapse: Secondary | ICD-10-CM | POA: Diagnosis not present

## 2021-09-12 DIAGNOSIS — F32A Depression, unspecified: Secondary | ICD-10-CM | POA: Diagnosis not present

## 2021-09-13 ENCOUNTER — Inpatient Hospital Stay (INDEPENDENT_AMBULATORY_CARE_PROVIDER_SITE_OTHER): Payer: Medicare Other | Admitting: Primary Care

## 2021-09-14 DIAGNOSIS — M6281 Muscle weakness (generalized): Secondary | ICD-10-CM | POA: Diagnosis not present

## 2021-09-14 DIAGNOSIS — L299 Pruritus, unspecified: Secondary | ICD-10-CM | POA: Diagnosis not present

## 2021-09-15 DIAGNOSIS — N1831 Chronic kidney disease, stage 3a: Secondary | ICD-10-CM | POA: Diagnosis not present

## 2021-09-15 DIAGNOSIS — E1121 Type 2 diabetes mellitus with diabetic nephropathy: Secondary | ICD-10-CM | POA: Diagnosis not present

## 2021-09-15 DIAGNOSIS — F32A Depression, unspecified: Secondary | ICD-10-CM | POA: Diagnosis not present

## 2021-09-15 DIAGNOSIS — M6281 Muscle weakness (generalized): Secondary | ICD-10-CM | POA: Diagnosis not present

## 2021-09-19 ENCOUNTER — Other Ambulatory Visit: Payer: Self-pay | Admitting: Nurse Practitioner

## 2021-09-19 DIAGNOSIS — E785 Hyperlipidemia, unspecified: Secondary | ICD-10-CM | POA: Diagnosis not present

## 2021-09-19 DIAGNOSIS — E7800 Pure hypercholesterolemia, unspecified: Secondary | ICD-10-CM | POA: Diagnosis not present

## 2021-09-19 DIAGNOSIS — E1121 Type 2 diabetes mellitus with diabetic nephropathy: Secondary | ICD-10-CM | POA: Diagnosis not present

## 2021-09-19 DIAGNOSIS — E1165 Type 2 diabetes mellitus with hyperglycemia: Secondary | ICD-10-CM | POA: Diagnosis not present

## 2021-09-19 DIAGNOSIS — N1831 Chronic kidney disease, stage 3a: Secondary | ICD-10-CM | POA: Diagnosis not present

## 2021-09-19 DIAGNOSIS — F32A Depression, unspecified: Secondary | ICD-10-CM | POA: Diagnosis not present

## 2021-09-20 DIAGNOSIS — L299 Pruritus, unspecified: Secondary | ICD-10-CM | POA: Diagnosis not present

## 2021-09-20 DIAGNOSIS — H3530 Unspecified macular degeneration: Secondary | ICD-10-CM | POA: Diagnosis not present

## 2021-09-20 DIAGNOSIS — E1121 Type 2 diabetes mellitus with diabetic nephropathy: Secondary | ICD-10-CM | POA: Diagnosis not present

## 2021-09-20 DIAGNOSIS — F32A Depression, unspecified: Secondary | ICD-10-CM | POA: Diagnosis not present

## 2021-09-21 DIAGNOSIS — F32A Depression, unspecified: Secondary | ICD-10-CM | POA: Diagnosis not present

## 2021-09-21 DIAGNOSIS — M25562 Pain in left knee: Secondary | ICD-10-CM | POA: Diagnosis not present

## 2021-09-21 DIAGNOSIS — M6281 Muscle weakness (generalized): Secondary | ICD-10-CM | POA: Diagnosis not present

## 2021-09-21 DIAGNOSIS — L299 Pruritus, unspecified: Secondary | ICD-10-CM | POA: Diagnosis not present

## 2021-09-24 ENCOUNTER — Other Ambulatory Visit: Payer: Self-pay

## 2021-09-24 ENCOUNTER — Emergency Department (HOSPITAL_COMMUNITY): Payer: Medicare Other

## 2021-09-24 ENCOUNTER — Emergency Department (HOSPITAL_COMMUNITY)
Admission: EM | Admit: 2021-09-24 | Discharge: 2021-09-25 | Disposition: A | Discharge status: Home / Self Care | Payer: Medicare Other | Attending: Emergency Medicine | Admitting: Emergency Medicine

## 2021-09-24 DIAGNOSIS — I10 Essential (primary) hypertension: Secondary | ICD-10-CM

## 2021-09-24 DIAGNOSIS — I129 Hypertensive chronic kidney disease with stage 1 through stage 4 chronic kidney disease, or unspecified chronic kidney disease: Secondary | ICD-10-CM | POA: Diagnosis not present

## 2021-09-24 DIAGNOSIS — R079 Chest pain, unspecified: Secondary | ICD-10-CM

## 2021-09-24 DIAGNOSIS — R6889 Other general symptoms and signs: Secondary | ICD-10-CM | POA: Diagnosis not present

## 2021-09-24 DIAGNOSIS — R519 Headache, unspecified: Secondary | ICD-10-CM

## 2021-09-24 DIAGNOSIS — R0789 Other chest pain: Secondary | ICD-10-CM | POA: Diagnosis not present

## 2021-09-24 DIAGNOSIS — M25562 Pain in left knee: Secondary | ICD-10-CM | POA: Diagnosis not present

## 2021-09-24 DIAGNOSIS — Z79899 Other long term (current) drug therapy: Secondary | ICD-10-CM | POA: Diagnosis not present

## 2021-09-24 DIAGNOSIS — Z7982 Long term (current) use of aspirin: Secondary | ICD-10-CM | POA: Insufficient documentation

## 2021-09-24 DIAGNOSIS — I499 Cardiac arrhythmia, unspecified: Secondary | ICD-10-CM | POA: Diagnosis not present

## 2021-09-24 DIAGNOSIS — Z743 Need for continuous supervision: Secondary | ICD-10-CM | POA: Diagnosis not present

## 2021-09-24 LAB — COMPREHENSIVE METABOLIC PANEL
ALT: 67 U/L — ABNORMAL HIGH (ref 0–44)
AST: 40 U/L (ref 15–41)
Albumin: 3.4 g/dL — ABNORMAL LOW (ref 3.5–5.0)
Alkaline Phosphatase: 254 U/L — ABNORMAL HIGH (ref 38–126)
Anion gap: 11 (ref 5–15)
BUN: 29 mg/dL — ABNORMAL HIGH (ref 8–23)
CO2: 24 mmol/L (ref 22–32)
Calcium: 9.6 mg/dL (ref 8.9–10.3)
Chloride: 102 mmol/L (ref 98–111)
Creatinine, Ser: 1.62 mg/dL — ABNORMAL HIGH (ref 0.61–1.24)
GFR, Estimated: 46 mL/min — ABNORMAL LOW (ref >60)
Glucose, Bld: 241 mg/dL — ABNORMAL HIGH (ref 70–99)
Potassium: 4.9 mmol/L (ref 3.5–5.1)
Sodium: 137 mmol/L (ref 135–145)
Total Bilirubin: 0.7 mg/dL (ref 0.3–1.2)
Total Protein: 7.5 g/dL (ref 6.5–8.1)

## 2021-09-24 LAB — CBC WITH DIFFERENTIAL/PLATELET
Abs Immature Granulocytes: 0.03 10*3/uL (ref 0.00–0.07)
Basophils Absolute: 0 10*3/uL (ref 0.0–0.1)
Basophils Relative: 1 %
Eosinophils Absolute: 0.1 10*3/uL (ref 0.0–0.5)
Eosinophils Relative: 2 %
HCT: 52.7 % — ABNORMAL HIGH (ref 39.0–52.0)
Hemoglobin: 17.6 g/dL — ABNORMAL HIGH (ref 13.0–17.0)
Immature Granulocytes: 0 %
Lymphocytes Relative: 10 %
Lymphs Abs: 0.8 10*3/uL (ref 0.7–4.0)
MCH: 29.1 pg (ref 26.0–34.0)
MCHC: 33.4 g/dL (ref 30.0–36.0)
MCV: 87.3 fL (ref 80.0–100.0)
Monocytes Absolute: 0.4 10*3/uL (ref 0.1–1.0)
Monocytes Relative: 5 %
Neutro Abs: 6.7 10*3/uL (ref 1.7–7.7)
Neutrophils Relative %: 82 %
Platelets: 215 10*3/uL (ref 150–400)
RBC: 6.04 MIL/uL — ABNORMAL HIGH (ref 4.22–5.81)
RDW: 13.1 % (ref 11.5–15.5)
WBC: 8.2 10*3/uL (ref 4.0–10.5)
nRBC: 0 % (ref 0.0–0.2)

## 2021-09-24 LAB — TROPONIN I (HIGH SENSITIVITY)
Troponin I (High Sensitivity): 12 ng/L (ref <18)
Troponin I (High Sensitivity): 12 ng/L (ref <18)

## 2021-09-24 LAB — CBG MONITORING, ED: Glucose-Capillary: 220 mg/dL — ABNORMAL HIGH (ref 70–99)

## 2021-09-24 MED ORDER — FINASTERIDE 5 MG PO TABS
5.0000 mg | ORAL_TABLET | Freq: Every day | ORAL | Status: DC
  Administered 2021-09-25: 5 mg via ORAL
  Filled 2021-09-24: qty 1

## 2021-09-24 MED ORDER — HYDRALAZINE HCL 25 MG PO TABS
25.0000 mg | ORAL_TABLET | Freq: Once | ORAL | Status: AC
  Administered 2021-09-24: 25 mg via ORAL
  Filled 2021-09-24: qty 1

## 2021-09-24 MED ORDER — MECLIZINE HCL 25 MG PO TABS
12.5000 mg | ORAL_TABLET | Freq: Two times a day (BID) | ORAL | Status: DC | PRN
Start: 2021-09-24 — End: 2021-09-25
  Administered 2021-09-24: 12.5 mg via ORAL
  Filled 2021-09-24: qty 1

## 2021-09-24 MED ORDER — INSULIN ASPART 100 UNIT/ML IJ SOLN
0.0000 [IU] | Freq: Every day | INTRAMUSCULAR | Status: DC
  Administered 2021-09-24: 2 [IU] via SUBCUTANEOUS

## 2021-09-24 MED ORDER — ASPIRIN 81 MG PO TBEC
81.0000 mg | DELAYED_RELEASE_TABLET | Freq: Every day | ORAL | Status: DC
  Administered 2021-09-25: 81 mg via ORAL
  Filled 2021-09-24: qty 1

## 2021-09-24 MED ORDER — ATORVASTATIN CALCIUM 40 MG PO TABS
40.0000 mg | ORAL_TABLET | Freq: Every day | ORAL | Status: DC
  Administered 2021-09-25: 40 mg via ORAL
  Filled 2021-09-24: qty 1

## 2021-09-24 MED ORDER — ACETAMINOPHEN 325 MG PO TABS
650.0000 mg | ORAL_TABLET | Freq: Four times a day (QID) | ORAL | Status: DC | PRN
Start: 2021-09-24 — End: 2021-09-25
  Administered 2021-09-24: 650 mg via ORAL
  Filled 2021-09-24 (×2): qty 2

## 2021-09-24 MED ORDER — LACTATED RINGERS IV BOLUS
500.0000 mL | Freq: Once | INTRAVENOUS | Status: AC
  Administered 2021-09-24: 500 mL via INTRAVENOUS

## 2021-09-24 MED ORDER — DIPHENHYDRAMINE HCL 50 MG/ML IJ SOLN
12.5000 mg | Freq: Once | INTRAMUSCULAR | Status: AC
  Administered 2021-09-24: 12.5 mg via INTRAVENOUS
  Filled 2021-09-24: qty 1

## 2021-09-24 MED ORDER — INSULIN GLARGINE-YFGN 100 UNIT/ML ~~LOC~~ SOLN
20.0000 [IU] | Freq: Every day | SUBCUTANEOUS | Status: DC
Start: 2021-09-25 — End: 2021-09-25
  Administered 2021-09-25: 20 [IU] via SUBCUTANEOUS
  Filled 2021-09-24: qty 0.2

## 2021-09-24 MED ORDER — INSULIN ASPART 100 UNIT/ML IJ SOLN
0.0000 [IU] | Freq: Three times a day (TID) | INTRAMUSCULAR | Status: DC
  Administered 2021-09-25: 5 [IU] via SUBCUTANEOUS

## 2021-09-24 MED ORDER — METOCLOPRAMIDE HCL 5 MG/ML IJ SOLN
10.0000 mg | Freq: Once | INTRAMUSCULAR | Status: AC
  Administered 2021-09-24: 10 mg via INTRAVENOUS
  Filled 2021-09-24: qty 2

## 2021-09-24 MED ORDER — HYDROXYZINE HCL 25 MG PO TABS
25.0000 mg | ORAL_TABLET | Freq: Three times a day (TID) | ORAL | Status: DC | PRN
Start: 2021-09-24 — End: 2021-09-25
  Administered 2021-09-24 – 2021-09-25 (×2): 25 mg via ORAL
  Filled 2021-09-24 (×2): qty 1

## 2021-09-24 MED ORDER — ACETAMINOPHEN 500 MG PO TABS
1000.0000 mg | ORAL_TABLET | Freq: Once | ORAL | Status: AC
  Administered 2021-09-24: 1000 mg via ORAL
  Filled 2021-09-24: qty 2

## 2021-09-24 MED ORDER — SERTRALINE HCL 50 MG PO TABS
50.0000 mg | ORAL_TABLET | Freq: Every day | ORAL | Status: DC
  Administered 2021-09-25: 50 mg via ORAL
  Filled 2021-09-24: qty 1

## 2021-09-24 MED ORDER — INSULIN ASPART 100 UNIT/ML IJ SOLN
4.0000 [IU] | Freq: Three times a day (TID) | INTRAMUSCULAR | Status: DC
  Administered 2021-09-25: 4 [IU] via SUBCUTANEOUS

## 2021-09-24 NOTE — ED Triage Notes (Signed)
BIB [facility] after staff at [facility] called to report pt having left sided chest pain. Per EMS, pt was d/c from [facility] today but became upset when asked to leave. Pt then was found to be HTN on scene, and was c/o c/p.  HX: HTN, DM, Depression, Bipolar CKD

## 2021-09-24 NOTE — ED Provider Triage Note (Signed)
Emergency Medicine Provider Triage Evaluation Note  [name] , a 66 y.o. male  was evaluated in triage.  Pt complains of dizziness.  He reports headache and feeling like his heart was racing.   Patient reports that he was too dizzy and [facility] discharged him per his report.    He reports multiple falls at the facility, last was yesterday morning per patient.   Physical Exam  BP (!) 184/133 (BP Location: Right Arm)   Pulse (!) 114   Temp 98 F (36.7 C)   Resp 18   SpO2 99%  Gen:   Awake, no distress   Resp:  Normal effort  MSK:   Moves extremities without difficulty  Other:  Normal speech  Medical Decision Making  Medically screening exam initiated at 2:46 PM.  Appropriate orders placed.  [name] was informed that the remainder of the evaluation will be completed by another provider, this initial triage assessment does not replace that evaluation, and the importance of remaining in the ED until their evaluation is complete.  Patient is refusing imaging for head and c-spine, cxr after I explained the indications to him.     Lorin Glass, [place] 09/24/21 1455

## 2021-09-24 NOTE — ED Provider Notes (Addendum)
[facility] EMERGENCY DEPARTMENT Provider Note   CSN: [id number] Arrival date & time: 09/24/21  1329     History  Chief Complaint  Patient presents with   Chest Pain    Left side     [name] is a 66 y.o. male.  Presented to the emergency room due to concern for chest pain.  Patient reports that his pain started sometime this morning he does not recall when it started or how it started exactly.  He says it has been relatively constant, central, nonradiating.  That is not associated with shortness of breath, currently mild to moderate in severity.  He says that he also has been dealing with a headache, headache started a couple hours ago, frontal, aching, nonradiating, not associate with numbness, weakness, speech or vision change.  Patient reports that he was at [facility] skilled nursing facility, was discharged this morning but did not feel like he was ready to go home and felt like he could have used more time facility.  HPI     Home Medications Prior to Admission medications   Medication Sig Start Date End Date Taking? Authorizing Provider  aspirin EC 81 MG tablet Take 81 mg by mouth daily with breakfast.     [provider]  atorvastatin (LIPITOR) 40 MG tablet Take 1 tablet (40 mg total) by mouth daily. 09/07/21   Hosie Poisson, MD  finasteride (PROSCAR) 5 MG tablet Take 1 tablet (5 mg total) by mouth daily. 09/07/21   Hosie Poisson, MD  hydrOXYzine (ATARAX) 25 MG tablet Take 1 tablet (25 mg total) by mouth 3 (three) times daily as needed for anxiety. 09/11/21   [place] Eric J, DO  insulin aspart (NOVOLOG) 100 UNIT/ML injection Inject 4 Units into the skin 3 (three) times daily with meals. 09/11/21   [place] Eric J, DO  insulin glargine-yfgn (SEMGLEE) 100 UNIT/ML injection Inject 0.2 mLs (20 Units total) into the skin daily. 09/12/21   [place] Donnamarie Poag, DO  meclizine (ANTIVERT) 12.5 MG tablet Take 1 tablet (12.5 mg total) by mouth 2 (two) times daily  as needed for dizziness. 09/06/21   Hosie Poisson, MD  Multiple Vitamin (MULTIVITAMIN WITH MINERALS) TABS tablet Take 1 tablet by mouth daily.    [provider]  Semaglutide (RYBELSUS) 14 MG TABS Take 1 tablet by mouth daily. Take 30 minutes before breakfast 12/06/20   [name], [name]  sertraline (ZOLOFT) 50 MG tablet Take 1 tablet (50 mg total) by mouth daily. 09/12/21   [place] Donnamarie Poag, DO  Tetrahydrozoline HCl (VISINE OP) Place 1 drop into both eyes daily as needed (itching/irritation).    [provider]  traZODone (DESYREL) 50 MG tablet Take 1 tablet (50 mg total) by mouth at bedtime as needed for sleep. 03/30/21   Ghimire, Henreitta Leber, MD      Allergies    Haldol [haloperidol]    Review of Systems   Review of Systems  Constitutional:  Negative for chills and fever.  HENT:  Negative for ear pain and sore throat.   Eyes:  Negative for pain and visual disturbance.  Respiratory:  Positive for chest tightness. Negative for cough and shortness of breath.   Cardiovascular:  Positive for chest pain. Negative for palpitations.  Gastrointestinal:  Negative for abdominal pain and vomiting.  Genitourinary:  Negative for dysuria and hematuria.  Musculoskeletal:  Negative for arthralgias and back pain.  Skin:  Negative for color change and rash.  Neurological:  Negative for  seizures and syncope.  All other systems reviewed and are negative.   Physical Exam Updated Vital Signs BP (!) 153/140   Pulse 87   Temp 97.6 F (36.4 C) (Oral)   Resp 17   Ht '5\' 6"'$  (1.676 m)   Wt 79.4 kg   SpO2 97%   BMI 28.25 kg/m  Physical Exam Vitals and nursing note reviewed.  Constitutional:      General: He is not in acute distress.    Appearance: He is well-developed.  HENT:     Head: Normocephalic and atraumatic.  Eyes:     Conjunctiva/sclera: Conjunctivae normal.  Cardiovascular:     Rate and Rhythm: Normal rate and regular rhythm.     Heart sounds: No murmur heard. Pulmonary:      Effort: Pulmonary effort is normal. No respiratory distress.     Breath sounds: Normal breath sounds.  Abdominal:     Palpations: Abdomen is soft.     Tenderness: There is no abdominal tenderness.  Musculoskeletal:        General: No swelling.     Cervical back: Neck supple.     Right lower leg: No edema.     Left lower leg: No edema.  Skin:    General: Skin is warm and dry.     Capillary Refill: Capillary refill takes less than 2 seconds.  Neurological:     Mental Status: He is alert.  Psychiatric:        Mood and Affect: Mood normal.     ED Results / Procedures / Treatments   Labs (all labs ordered are listed, but only abnormal results are displayed) Labs Reviewed  COMPREHENSIVE METABOLIC PANEL - Abnormal; Notable for the following components:      Result Value   Glucose, Bld 241 (*)    BUN 29 (*)    Creatinine, Ser 1.62 (*)    Albumin 3.4 (*)    ALT 67 (*)    Alkaline Phosphatase 254 (*)    GFR, Estimated 46 (*)    All other components within normal limits  CBC WITH DIFFERENTIAL/PLATELET - Abnormal; Notable for the following components:   RBC 6.04 (*)    Hemoglobin 17.6 (*)    HCT 52.7 (*)    All other components within normal limits  TROPONIN I (HIGH SENSITIVITY)  TROPONIN I (HIGH SENSITIVITY)    EKG None  Radiology CT Head Wo Contrast  Result Date: 09/24/2021 CLINICAL DATA:  Headache, new or worsening (Age >= 50y) EXAM: CT HEAD WITHOUT CONTRAST TECHNIQUE: Contiguous axial images were obtained from the base of the skull through the vertex without intravenous contrast. RADIATION DOSE REDUCTION: This exam was performed according to the departmental dose-optimization program which includes automated exposure control, adjustment of the mA and/or kV according to patient size and/or use of iterative reconstruction technique. COMPARISON:  09/05/2021 FINDINGS: Brain: There is no acute intracranial hemorrhage, mass effect, or edema. Gray-white differentiation is  preserved. There is no extra-axial fluid collection. Prominence of the ventricles and sulci reflects stable parenchymal volume loss. Patchy and confluent hypoattenuation in the supratentorial white matter probably reflects stable chronic microvascular ischemic changes. Vascular: There is atherosclerotic calcification at the skull base. Skull: Calvarium is unremarkable. Sinuses/Orbits: No acute finding. Other: None. IMPRESSION: No acute intracranial abnormality. Stable chronic/nonemergent findings detailed above. Electronically Signed   By: Macy Mis M.D.   On: 09/24/2021 18:22   DG Chest 2 View  Result Date: 09/24/2021 CLINICAL DATA:  Chest pain EXAM: CHEST -  2 VIEW COMPARISON:  09/01/2021 FINDINGS: The heart size and mediastinal contours are within normal limits. Both lungs are clear. The visualized skeletal structures are unremarkable. IMPRESSION: No active cardiopulmonary disease. Electronically Signed   By: Davina Poke D.O.   On: 09/24/2021 17:02    Procedures Procedures    Medications Ordered in ED Medications  aspirin EC tablet 81 mg (has no administration in time range)  atorvastatin (LIPITOR) tablet 40 mg (has no administration in time range)  finasteride (PROSCAR) tablet 5 mg (has no administration in time range)  hydrOXYzine (ATARAX) tablet 25 mg (has no administration in time range)  insulin aspart (novoLOG) injection 4 Units (has no administration in time range)  insulin glargine-yfgn (SEMGLEE) injection 20 Units (has no administration in time range)  meclizine (ANTIVERT) tablet 12.5 mg (has no administration in time range)  sertraline (ZOLOFT) tablet 50 mg (has no administration in time range)  insulin aspart (novoLOG) injection 0-15 Units (has no administration in time range)  insulin aspart (novoLOG) injection 0-5 Units (has no administration in time range)  hydrALAZINE (APRESOLINE) tablet 25 mg (25 mg Oral Given 09/24/21 1621)  acetaminophen (TYLENOL) tablet 1,000 mg  (1,000 mg Oral Given 09/24/21 1621)  metoCLOPramide (REGLAN) injection 10 mg (10 mg Intravenous Given 09/24/21 1623)  diphenhydrAMINE (BENADRYL) injection 12.5 mg (12.5 mg Intravenous Given 09/24/21 1624)  lactated ringers bolus 500 mL (0 mLs Intravenous Stopped 09/24/21 1804)    ED Course/ Medical Decision Making/ A&P                           Medical Decision Making Amount and/or Complexity of Data Reviewed Radiology: ordered.  Risk OTC drugs. Prescription drug management.   66 year old gentleman presenting to ER due to concern for headache and chest pain.  Recent admission for dizziness, syncope.  He was discharged to [facility] for rehab.  Patient presented to ER due to concern for chest pain and headache.  His work-up from a medical standpoint is all grossly reassuring.  His EKG does not have any acute ischemic change, troponin x2 within normal limits.  His CT of his head is negative.  Provided headache cocktail, fluids, reassess.  Headache and chest pain have resolved.  When discussing disposition, patient states that he is frequently falling, cannot care for himself at home and does not feel safe returning home at this time.  He was apparently recently discharged from [facility]; he reported that he was not ready to be discharged and reports that he still had insurance days left.  I have consulted TOC to investigate, Riquita and Mariann Laster have spent considerable time discussing with patient and attempting to get hold of the right folks at [facility]. They recommend holding patient in ER tonight and have our day time team look into this further. I will also place consult to PT to see if there is any ongoing PT needs. I have ordered his home medications.     Final Clinical Impression(s) / ED Diagnoses Final diagnoses:  Chest pain, unspecified type  Hypertension, unspecified type  Nonintractable headache, unspecified chronicity pattern, unspecified headache type    Rx / DC Orders ED  Discharge Orders     None         Lucrezia Starch, MD 09/24/21 2213    Lucrezia Starch, MD 09/24/21 2249

## 2021-09-24 NOTE — Care Management (Signed)
TOC team met with patient at bedside, patient reports he was discharged today from [facility], patient states he was told by Anderson Malta the Administration  he was eligible to return to the facility,  [facility] will follow up  the administrator in the am.

## 2021-09-24 NOTE — ED Notes (Signed)
Pt visibly upset and repeatedly asking to speak to staff at [facility] so he can go back. This RN contacted facility. He was officially d/c today and would require additional preauthorization prior to being placed. Per facility, home health has been set up at home for his care.

## 2021-09-24 NOTE — Progress Notes (Signed)
Transition of Care [facility] [place]) - Emergency Department Mini Assessment   Patient Details  Name: [name] MRN: [id number] Date of Birth: May 07, 1955  Transition of Care [name]) CM/SW Contact:    [name], [place] Phone Number: 09/24/2021, 5:14 PM   Clinical Narrative: TOC CSW attempted to contact [name] in regards to pts return.  CSW awaiting a return call.  Per RN notes pt was dc'd today.  Pt currently has been provided with [facility] on his return home.  [name], MSW, LCSW-A Pronouns:  She/Her/Hers [facility] Clinical Social Worker Direct Number:  [phone] [email or web address]    ED Mini Assessment: What brought you to the Emergency Department? : Chest Pain  Barriers to Discharge: No Barriers Identified     Means of departure: Not know  Interventions which prevented an admission or readmission: SNF Placement    Patient Contact and Communications Key Contact 1: [name] Date: 09/24/21,     Contact Phone Number: [phone] Call outcome: CSW currently awaiting a return call.  Patient states their goals for this hospitalization and ongoing recovery are:: Wants to return to [facility].   Choice offered to / list presented to : NA  Admission diagnosis:  cp Patient Active Problem List   Diagnosis Date Noted   Syncope and collapse 09/02/2021   History of prostate cancer with recurrent urinary obstruction  03/24/2021   Polycythemia 03/24/2021   COVID-19 virus infection 03/24/2021   History of noncompliance with medical treatment 03/24/2021   HLD (hyperlipidemia) 01/05/2021   Acute urinary retention 10/25/2020   Adjustment disorder with mixed anxiety and depressed mood    Ataxia 08/01/2020   Dizziness 07/31/2020   Hydroureteronephrosis 02/10/2020   Nausea and vomiting 02/10/2020   Pneumonia due to COVID-19 virus 09/29/2019   Hyponatremia 09/28/2019   Sinus tachycardia  09/28/2019   Insomnia 08/26/2018   Nephropathy 03/26/2018   Urinary urgency 03/26/2018   Type 2 diabetes mellitus with diabetic nephropathy, without long-term current use of insulin ([place]) 03/26/2018   Essential hypertension 03/26/2018   Chronic kidney disease (CKD), stage III (moderate) ([place]) 03/26/2018   Conductive hearing loss, bilateral 11/07/2017   Microcytic anemia 02/17/2012   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: [phone]: [phone]

## 2021-09-24 NOTE — ED Notes (Signed)
Pt refusing to be hooked up to cardiac monitoring, states "I don't need all that I just need to speak to the doctor".

## 2021-09-25 DIAGNOSIS — I10 Essential (primary) hypertension: Secondary | ICD-10-CM | POA: Diagnosis not present

## 2021-09-25 LAB — CBG MONITORING, ED: Glucose-Capillary: 210 mg/dL — ABNORMAL HIGH (ref 70–99)

## 2021-09-25 MED ORDER — HYDROCHLOROTHIAZIDE 25 MG PO TABS
25.0000 mg | ORAL_TABLET | Freq: Every day | ORAL | Status: DC
  Administered 2021-09-25: 25 mg via ORAL
  Filled 2021-09-25: qty 1

## 2021-09-25 MED ORDER — AMLODIPINE BESYLATE 5 MG PO TABS
5.0000 mg | ORAL_TABLET | Freq: Once | ORAL | Status: AC
  Administered 2021-09-25: 5 mg via ORAL
  Filled 2021-09-25: qty 1

## 2021-09-25 MED ORDER — IRBESARTAN 300 MG PO TABS
300.0000 mg | ORAL_TABLET | Freq: Every day | ORAL | Status: DC
  Filled 2021-09-25: qty 1

## 2021-09-25 NOTE — Progress Notes (Signed)
SW, Melissa Black from [place] called CSW back and told CSW that patient was setup with [facility] for home health. Patient was also setup with a new primary care doctor at [facility]. CSW was told that patient refused to take the paperwork with him and that they also prescribed him medication.

## 2021-09-25 NOTE — ED Provider Notes (Signed)
Emergency Medicine Observation Re-evaluation Note  [name] is a 66 y.o. male, seen on rounds today.  Pt initially presented to the ED for complaints of Chest Pain (Left side ) Currently, the patient is sitting in bed.  Physical Exam  BP (!) 171/135   Pulse 88   Temp 98.1 F (36.7 C)   Resp 15   Ht '5\' 6"'$  (1.676 m)   Wt 79.4 kg   SpO2 95%   BMI 28.25 kg/m  Physical Exam General: Awake, alert Cardiac: Normal heart rate and rhythm Lungs: Breathing unlabored, SPO2 normal on room air Psych: No agitation, calm and cooperative  ED Course / MDM  EKG:   I have reviewed the labs performed to date as well as medications administered while in observation.  Recent changes in the last 24 hours include social work has been speaking with [facility].  [place] states that patient is discharged and they will not accept him back.  This was because they were planning on discharging him anyway and he had the behavioral outburst 2 days ago.  Social work states that he does not meet criteria for placement in a different nursing facility.  Patient was informed of this.  He does have a home but is worried about taking care of himself.  Despite this, he is resistant to obtaining home health.  He states that he does not have any family to call.  He does have a friend that he will try to reach out to.  He is alert and oriented.  He was advised to follow-up with his primary care doctor for [facility] paperwork.  He can also follow-up with neurology for evaluation of dementia, however, he appears to have short-term memory intact on my exam.  Patient has had elevated blood pressures while in the ED.  He was previously on Benicar, HCTZ, and amlodipine.  It appears that, during his most recent hospitalization, these medications were discontinued.  It is unclear why.  Previous home medications were ordered.  Subsequent blood pressure was improved.  He refused any further blood pressure checks.  Patient to follow-up with  PCP for further blood pressure control.  Patient was discharged in stable condition.  Plan  Current plan is for discharge.  [name] is not under involuntary commitment.     Godfrey Pick, MD 09/25/21 [phone]

## 2021-09-25 NOTE — ED Notes (Signed)
Patient refused recheck of B/P. Patient states he does not need that.

## 2021-09-25 NOTE — Progress Notes (Signed)
CSW spoke with Meka with admissions at [facility] who stated due to patients behaviors they will not take him back. CSW was told that the social worker was working on discharging him prior to the incident that occurred yesterday. CSW was told patient was being combative yesterday when asked to leave. Meka was not sure what home health agency was assigned to patient but she told CSW she would call back and let her know. CSW spoke with patient to let him know [facility] will not take him back so he will have to discharge back home. CSW told patient that she will provide a personal care services form he can have filled out by his primary doctor. PCS through medicaid can offer up to 4 hours daily in the home. CSW also told patient that [facility] had discharged him with home health as well. CSW reminded patient if he can't find rides to doctors appointments to contact medicaid for transportation in advance. Patient stated he might stay with a friend as well who can help him around the house. Patient stated he has also asked his neighbor in the past at his apartment that has helped him before.

## 2021-09-25 NOTE — Discharge Instructions (Signed)
Medicaid personal care services will be attached to your discharge paperwork. Please bring this form to your primary doctor for them to fill out. Personal care services through medicaid get sometimes offer up to 4 hours of care in the home each day. Your primary doctor can also fill out an FL2 form for assisted living.

## 2021-10-03 ENCOUNTER — Ambulatory Visit: Payer: Medicare Other

## 2021-10-03 DIAGNOSIS — R350 Frequency of micturition: Secondary | ICD-10-CM | POA: Diagnosis not present

## 2021-10-05 ENCOUNTER — Other Ambulatory Visit: Payer: Self-pay | Admitting: Nurse Practitioner

## 2021-10-20 ENCOUNTER — Emergency Department (HOSPITAL_COMMUNITY): Payer: Medicare Other

## 2021-10-20 ENCOUNTER — Emergency Department (HOSPITAL_BASED_OUTPATIENT_CLINIC_OR_DEPARTMENT_OTHER): Payer: Medicare Other

## 2021-10-20 ENCOUNTER — Encounter (HOSPITAL_COMMUNITY): Payer: Self-pay | Admitting: Emergency Medicine

## 2021-10-20 ENCOUNTER — Emergency Department (HOSPITAL_COMMUNITY)
Admission: EM | Admit: 2021-10-20 | Discharge: 2021-10-20 | Disposition: A | Discharge status: Home / Self Care | Payer: Medicare Other | Attending: Emergency Medicine | Admitting: Emergency Medicine

## 2021-10-20 DIAGNOSIS — E119 Type 2 diabetes mellitus without complications: Secondary | ICD-10-CM | POA: Diagnosis not present

## 2021-10-20 DIAGNOSIS — Z7982 Long term (current) use of aspirin: Secondary | ICD-10-CM | POA: Diagnosis not present

## 2021-10-20 DIAGNOSIS — Z79899 Other long term (current) drug therapy: Secondary | ICD-10-CM | POA: Insufficient documentation

## 2021-10-20 DIAGNOSIS — Z7984 Long term (current) use of oral hypoglycemic drugs: Secondary | ICD-10-CM | POA: Insufficient documentation

## 2021-10-20 DIAGNOSIS — M7989 Other specified soft tissue disorders: Secondary | ICD-10-CM | POA: Diagnosis not present

## 2021-10-20 DIAGNOSIS — I10 Essential (primary) hypertension: Secondary | ICD-10-CM | POA: Insufficient documentation

## 2021-10-20 DIAGNOSIS — Z794 Long term (current) use of insulin: Secondary | ICD-10-CM | POA: Insufficient documentation

## 2021-10-20 DIAGNOSIS — M25572 Pain in left ankle and joints of left foot: Secondary | ICD-10-CM

## 2021-10-20 DIAGNOSIS — M109 Gout, unspecified: Secondary | ICD-10-CM | POA: Insufficient documentation

## 2021-10-20 DIAGNOSIS — M79605 Pain in left leg: Secondary | ICD-10-CM

## 2021-10-20 DIAGNOSIS — M25562 Pain in left knee: Secondary | ICD-10-CM | POA: Diagnosis not present

## 2021-10-20 DIAGNOSIS — M25462 Effusion, left knee: Secondary | ICD-10-CM | POA: Diagnosis not present

## 2021-10-20 DIAGNOSIS — M10072 Idiopathic gout, left ankle and foot: Secondary | ICD-10-CM | POA: Diagnosis not present

## 2021-10-20 DIAGNOSIS — M19072 Primary osteoarthritis, left ankle and foot: Secondary | ICD-10-CM | POA: Diagnosis not present

## 2021-10-20 LAB — URIC ACID: Uric Acid, Serum: 10.2 mg/dL — ABNORMAL HIGH (ref 3.7–8.6)

## 2021-10-20 MED ORDER — HYDROCODONE-ACETAMINOPHEN 5-325 MG PO TABS
1.0000 | ORAL_TABLET | Freq: Once | ORAL | Status: AC
  Administered 2021-10-20: 1 via ORAL
  Filled 2021-10-20: qty 1

## 2021-10-20 MED ORDER — IBUPROFEN 800 MG PO TABS
800.0000 mg | ORAL_TABLET | Freq: Once | ORAL | Status: AC
  Administered 2021-10-20: 800 mg via ORAL
  Filled 2021-10-20: qty 1

## 2021-10-20 MED ORDER — METHYLPREDNISOLONE 4 MG PO TBPK: ORAL_TABLET | ORAL | 0 refills | Status: DC

## 2021-10-20 MED ORDER — DEXAMETHASONE SODIUM PHOSPHATE 10 MG/ML IJ SOLN
10.0000 mg | Freq: Once | INTRAMUSCULAR | Status: AC
Start: 2021-10-20 — End: 2021-10-20
  Administered 2021-10-20: 10 mg via INTRAMUSCULAR
  Filled 2021-10-20: qty 1

## 2021-10-20 NOTE — ED Provider Triage Note (Signed)
Emergency Medicine Provider Triage Evaluation Note  [name] , a 66 y.o. male  was evaluated in triage.  Pt complains of severe left ankle pain for the last 4 days. Patient wondering whether injury vs. Gout. No improvement with tylenol. Denies numbness, tingling. Previous hx of calcaneus operation on same side, per patient, remotely.  Review of Systems  Positive: Ankle pain, foot pain Negative: Fever, chills  Physical Exam  BP (!) 149/109   Pulse (!) 109   Temp 98.9 F (37.2 C) (Oral)   Resp 18   Ht '5\' 6"'$  (1.676 m)   Wt 74.8 kg   SpO2 98%   BMI 26.63 kg/m  Gen:   Awake, no distress   Resp:  Normal effort  MSK:   Moves extremities without difficulty  Other:  TTP of medial and lateral left foot, decreased passive ROM of ankle 2/2 pain  Medical Decision Making  Medically screening exam initiated at 12:14 PM.  Appropriate orders placed.  [name] was informed that the remainder of the evaluation will be completed by another provider, this initial triage assessment does not replace that evaluation, and the importance of remaining in the ED until their evaluation is complete.  Workup initiated   [name], [place] 10/20/21 1216

## 2021-10-20 NOTE — Progress Notes (Signed)
LLE venous duplex has been completed.  Preliminary findings given to Genevive Bi, PA-C.   Results can be found under chart review under CV PROC. 10/20/2021 5:03 PM Ladye Macnaughton RVT, RDMS

## 2021-10-20 NOTE — ED Triage Notes (Signed)
Pt arrives via POV with left leg pain and swelling for 3 day. States the pain goes from ankle to thigh.

## 2021-10-20 NOTE — ED Notes (Signed)
Patient moved to room to have ultrasound done

## 2021-10-20 NOTE — ED Provider Notes (Signed)
[facility] DEPT Provider Note   CSN: [id number] Arrival date & time: 10/20/21  1155     History  Chief Complaint  Patient presents with   Leg Swelling    TYSEN ROESLER is a 66 y.o. male with medical history of AKI, GERD, depression, diabetes, gout, hypertension.  The patient presents to the ED for evaluation of left lower extremity pain.  The patient states that 3 days ago he began experiencing pain in his left ankle.  The patient states that over the course of the last 3 days the pain is progressively worsened and spread.  The patient states that the pain is now extending from his ankle all the way up to just above his left knee.  Patient denies any trauma, excessive exercise, walking, event to account for this pain.  Patient states he is concerned that this is gout.  HPI     Home Medications Prior to Admission medications   Medication Sig Start Date End Date Taking? Authorizing Provider  methylPREDNISolone (MEDROL DOSEPAK) 4 MG TBPK tablet Day 1: Take 2 tablets for breakfast, 1 tab after lunch and after supper, 2 tabs at bedtime.  Day 2: Take 1 tab before breakfast, 1 tablet after lunch and after supper, 2 tablets at bedtime.  Day 3: Take 1 tablet before breakfast, 1 tablet for lunch, 1 tablet after supper, 1 tablet at bedtime.  Day 4: Take 1 tablet before breakfast, 1 tablet after lunch, 1 tablet at bedtime.  Day 5: Take 1 tab before breakfast and at bedtime.  Day 6: Take 1 tablet before breakfast 10/20/21  Yes Azucena Cecil, PA-C  aspirin EC 81 MG tablet Take 81 mg by mouth daily with breakfast.     [provider]  atorvastatin (LIPITOR) 40 MG tablet Take 1 tablet (40 mg total) by mouth daily. 09/07/21   Hosie Poisson, MD  finasteride (PROSCAR) 5 MG tablet Take 1 tablet (5 mg total) by mouth daily. 09/07/21   Hosie Poisson, MD  hydrOXYzine (ATARAX) 25 MG tablet Take 1 tablet (25 mg total) by mouth 3 (three) times daily as needed for anxiety.  09/11/21   [place] Eric J, DO  insulin aspart (NOVOLOG) 100 UNIT/ML injection Inject 4 Units into the skin 3 (three) times daily with meals. 09/11/21   [place] Eric J, DO  insulin glargine-yfgn (SEMGLEE) 100 UNIT/ML injection Inject 0.2 mLs (20 Units total) into the skin daily. 09/12/21   [place] Donnamarie Poag, DO  meclizine (ANTIVERT) 12.5 MG tablet Take 1 tablet (12.5 mg total) by mouth 2 (two) times daily as needed for dizziness. 09/06/21   Hosie Poisson, MD  Multiple Vitamin (MULTIVITAMIN WITH MINERALS) TABS tablet Take 1 tablet by mouth daily.    [provider]  Semaglutide (RYBELSUS) 14 MG TABS Take 1 tablet by mouth daily. Take 30 minutes before breakfast 12/06/20   [name], [name]  sertraline (ZOLOFT) 50 MG tablet Take 1 tablet (50 mg total) by mouth daily. 09/12/21   [place] Donnamarie Poag, DO  Tetrahydrozoline HCl (VISINE OP) Place 1 drop into both eyes daily as needed (itching/irritation).    [provider]  traZODone (DESYREL) 50 MG tablet Take 1 tablet (50 mg total) by mouth at bedtime as needed for sleep. 03/30/21   Ghimire, Henreitta Leber, MD      Allergies    Haldol [haloperidol]    Review of Systems   Review of Systems  Musculoskeletal:  Positive for arthralgias and myalgias.  All other systems  reviewed and are negative.   Physical Exam Updated Vital Signs BP (!) 149/109   Pulse (!) 109   Temp 98.9 F (37.2 C) (Oral)   Resp 18   Ht '5\' 6"'$  (1.676 m)   Wt 74.8 kg   SpO2 98%   BMI 26.63 kg/m  Physical Exam Vitals and nursing note reviewed.  Constitutional:      General: He is not in acute distress.    Appearance: Normal appearance. He is not ill-appearing, toxic-appearing or diaphoretic.  HENT:     Head: Normocephalic and atraumatic.     Nose: Nose normal. No congestion.     Mouth/Throat:     Mouth: Mucous membranes are moist.     Pharynx: Oropharynx is clear.  Eyes:     Extraocular Movements: Extraocular movements intact.     Conjunctiva/sclera: Conjunctivae  normal.     Pupils: Pupils are equal, round, and reactive to light.  Cardiovascular:     Rate and Rhythm: Normal rate and regular rhythm.  Pulmonary:     Effort: Pulmonary effort is normal.     Breath sounds: Normal breath sounds. No wheezing.  Abdominal:     General: Abdomen is flat. Bowel sounds are normal.     Palpations: Abdomen is soft.     Tenderness: There is no abdominal tenderness.  Musculoskeletal:     Cervical back: Normal range of motion and neck supple. No tenderness.     Right upper leg: Normal.     Left upper leg: Tenderness present. No swelling, edema, deformity or lacerations.     Right knee: Normal.     Left knee: Normal range of motion. Tenderness present.     Left lower leg: Tenderness present. No swelling. No edema.     Left ankle: Tenderness present.     Left foot: Tenderness present.  Skin:    General: Skin is warm and dry.     Capillary Refill: Capillary refill takes less than 2 seconds.  Neurological:     Mental Status: He is alert and oriented to person, place, and time.     ED Results / Procedures / Treatments   Labs (all labs ordered are listed, but only abnormal results are displayed) Labs Reviewed  URIC ACID - Abnormal; Notable for the following components:      Result Value   Uric Acid, Serum 10.2 (*)    All other components within normal limits    EKG None  Radiology DG Knee 2 Views Left  Result Date: 10/20/2021 CLINICAL DATA:  Medial knee pain and swelling for 3 days. EXAM: LEFT KNEE - 1-2 VIEW COMPARISON:  None Available. FINDINGS: The mineralization and alignment are normal. There is no evidence of acute fracture or dislocation. The joint spaces appear preserved. There is a moderate-sized knee joint effusion with possible prepatellar soft tissue swelling. No evidence of foreign body. Femoropopliteal atherosclerosis noted. IMPRESSION: Moderate-sized joint effusion with possible prepatellar soft tissue swelling. No acute osseous findings  evident. Electronically Signed   By: Richardean Sale M.D.   On: 10/20/2021 14:15   DG Ankle Complete Left  Result Date: 10/20/2021 CLINICAL DATA:  Left ankle swelling and pain. EXAM: LEFT ANKLE COMPLETE - 3+ VIEW COMPARISON:  None Available. FINDINGS: No evidence for an acute fracture. No subluxation or dislocation. Degenerative changes noted in the mid and hindfoot, similar to foot x-ray from 11/27/2018. Bones are diffusely demineralized. IMPRESSION: No acute bony abnormality. Electronically Signed   By: Verda Cumins.D.  On: 10/20/2021 13:18    Procedures Procedures   Medications Ordered in ED Medications  dexamethasone (DECADRON) injection 10 mg (has no administration in time range)  ibuprofen (ADVIL) tablet 800 mg (800 mg Oral Given 10/20/21 1220)  HYDROcodone-acetaminophen (NORCO/VICODIN) 5-325 MG per tablet 1 tablet (1 tablet Oral Given 10/20/21 1331)    ED Course/ Medical Decision Making/ A&P Clinical Course as of 10/20/21 1611  Sat Oct 20, 2021  1541 [place] to rule out DVT, dc with gout treatment [CP]    Clinical Course User Index [CP] Anselmo Pickler, PA-C                           Medical Decision Making Amount and/or Complexity of Data Reviewed Labs: ordered. Radiology: ordered.  Risk Prescription drug management.   66 year old presents to ED for evaluation.  Please see HPI for further details.  On examination, patient afebrile and nontachycardic.  Patient lung sounds clear bilaterally, not hypoxic on room air.  The patient abdomen is soft and compressible all 4 quadrants.  Patient left lower extremity has no overlying skin change.  The patient will not allow me to range his knee secondary to pain, would not allow me to range his ankle secondary to pain.  Patient has extensive onychomycosis to his left toenails.  Concern for gout moderate.  Patient was up utilizing the following labs and imaging studies interpreted by me personally: - Uric acid level elevated to  10.2 - Plain film imaging of patient ankle and knee on left side are negative for any acute abnormality, fracture or dislocation - Patient DVT study of left lower extremity does not show any signs of DVT however there is appearance of SVT and small saphenous vein.  Plan for this patient will be to discharge him on steroid Dosepak.  The patient has history of chronic kidney disease, is not a candidate for NSAID therapy.  Patient be advised to follow-up with his PCP for further management.  Patient states he will be unable to walk on left foot and is requesting crutches.  Crutches were ordered. Patient was given return precautions and he voiced understanding.  The patient had all of his questions answered to his satisfaction prior to discharge.  Patient stable at this time for discharge home.  Final Clinical Impression(s) / ED Diagnoses Final diagnoses:  Acute gout of left ankle, unspecified cause    Rx / DC Orders ED Discharge Orders          Ordered    methylPREDNISolone (MEDROL DOSEPAK) 4 MG TBPK tablet        10/20/21 1607              Azucena Cecil, PA-C 10/20/21 1611    Godfrey Pick, MD 10/22/21 Bosie Helper

## 2021-10-20 NOTE — Discharge Instructions (Addendum)
Please return to the ED with any new or worsening symptoms Please begin taking steroid Dosepak.  Please begin taking this tomorrow, Sunday.  You have already been given steroids here today. Please read attached guide concerning gout as well as eating plans to decrease chances of gout flares Please utilize crutches provided

## 2021-10-20 NOTE — ED Notes (Signed)
Patient moved back to the hallway

## 2021-10-21 ENCOUNTER — Other Ambulatory Visit: Payer: Self-pay | Admitting: Nurse Practitioner

## 2021-10-29 ENCOUNTER — Encounter (HOSPITAL_COMMUNITY): Payer: Self-pay

## 2021-10-29 ENCOUNTER — Other Ambulatory Visit: Payer: Self-pay

## 2021-10-29 ENCOUNTER — Encounter (HOSPITAL_COMMUNITY)
Admission: EM | Disposition: A | Discharge status: Skilled Nursing Facility | Payer: Self-pay | Source: Home / Self Care | Attending: Internal Medicine

## 2021-10-29 ENCOUNTER — Inpatient Hospital Stay (HOSPITAL_COMMUNITY)
Admission: EM | Admit: 2021-10-29 | Discharge: 2021-10-31 | DRG: 247 | Disposition: A | Discharge status: Skilled Nursing Facility | Payer: Medicare Other | Attending: Internal Medicine | Admitting: Internal Medicine

## 2021-10-29 ENCOUNTER — Emergency Department (HOSPITAL_COMMUNITY): Payer: Medicare Other

## 2021-10-29 DIAGNOSIS — Z800 Family history of malignant neoplasm of digestive organs: Secondary | ICD-10-CM

## 2021-10-29 DIAGNOSIS — D509 Iron deficiency anemia, unspecified: Secondary | ICD-10-CM | POA: Diagnosis not present

## 2021-10-29 DIAGNOSIS — R079 Chest pain, unspecified: Secondary | ICD-10-CM | POA: Diagnosis not present

## 2021-10-29 DIAGNOSIS — Z808 Family history of malignant neoplasm of other organs or systems: Secondary | ICD-10-CM | POA: Diagnosis not present

## 2021-10-29 DIAGNOSIS — Z794 Long term (current) use of insulin: Secondary | ICD-10-CM | POA: Diagnosis not present

## 2021-10-29 DIAGNOSIS — N1831 Chronic kidney disease, stage 3a: Secondary | ICD-10-CM | POA: Diagnosis not present

## 2021-10-29 DIAGNOSIS — Z8249 Family history of ischemic heart disease and other diseases of the circulatory system: Secondary | ICD-10-CM | POA: Diagnosis not present

## 2021-10-29 DIAGNOSIS — I2510 Atherosclerotic heart disease of native coronary artery without angina pectoris: Secondary | ICD-10-CM

## 2021-10-29 DIAGNOSIS — Z79899 Other long term (current) drug therapy: Secondary | ICD-10-CM

## 2021-10-29 DIAGNOSIS — R32 Unspecified urinary incontinence: Secondary | ICD-10-CM | POA: Diagnosis present

## 2021-10-29 DIAGNOSIS — C61 Malignant neoplasm of prostate: Secondary | ICD-10-CM | POA: Diagnosis not present

## 2021-10-29 DIAGNOSIS — E871 Hypo-osmolality and hyponatremia: Secondary | ICD-10-CM | POA: Diagnosis not present

## 2021-10-29 DIAGNOSIS — R42 Dizziness and giddiness: Secondary | ICD-10-CM | POA: Diagnosis not present

## 2021-10-29 DIAGNOSIS — E1122 Type 2 diabetes mellitus with diabetic chronic kidney disease: Secondary | ICD-10-CM | POA: Diagnosis not present

## 2021-10-29 DIAGNOSIS — F32A Depression, unspecified: Secondary | ICD-10-CM | POA: Diagnosis not present

## 2021-10-29 DIAGNOSIS — N401 Enlarged prostate with lower urinary tract symptoms: Secondary | ICD-10-CM | POA: Diagnosis present

## 2021-10-29 DIAGNOSIS — I10 Essential (primary) hypertension: Secondary | ICD-10-CM | POA: Diagnosis not present

## 2021-10-29 DIAGNOSIS — E785 Hyperlipidemia, unspecified: Secondary | ICD-10-CM | POA: Diagnosis not present

## 2021-10-29 DIAGNOSIS — Z801 Family history of malignant neoplasm of trachea, bronchus and lung: Secondary | ICD-10-CM

## 2021-10-29 DIAGNOSIS — Z955 Presence of coronary angioplasty implant and graft: Secondary | ICD-10-CM

## 2021-10-29 DIAGNOSIS — Z833 Family history of diabetes mellitus: Secondary | ICD-10-CM | POA: Diagnosis not present

## 2021-10-29 DIAGNOSIS — K219 Gastro-esophageal reflux disease without esophagitis: Secondary | ICD-10-CM | POA: Diagnosis present

## 2021-10-29 DIAGNOSIS — R739 Hyperglycemia, unspecified: Secondary | ICD-10-CM

## 2021-10-29 DIAGNOSIS — Z7982 Long term (current) use of aspirin: Secondary | ICD-10-CM

## 2021-10-29 DIAGNOSIS — Z743 Need for continuous supervision: Secondary | ICD-10-CM | POA: Diagnosis not present

## 2021-10-29 DIAGNOSIS — D751 Secondary polycythemia: Secondary | ICD-10-CM | POA: Diagnosis not present

## 2021-10-29 DIAGNOSIS — G14 Postpolio syndrome: Secondary | ICD-10-CM | POA: Diagnosis present

## 2021-10-29 DIAGNOSIS — N183 Chronic kidney disease, stage 3 unspecified: Secondary | ICD-10-CM | POA: Diagnosis not present

## 2021-10-29 DIAGNOSIS — R109 Unspecified abdominal pain: Secondary | ICD-10-CM | POA: Diagnosis not present

## 2021-10-29 DIAGNOSIS — I25110 Atherosclerotic heart disease of native coronary artery with unstable angina pectoris: Secondary | ICD-10-CM | POA: Diagnosis not present

## 2021-10-29 DIAGNOSIS — N289 Disorder of kidney and ureter, unspecified: Secondary | ICD-10-CM | POA: Diagnosis not present

## 2021-10-29 DIAGNOSIS — R55 Syncope and collapse: Secondary | ICD-10-CM | POA: Diagnosis not present

## 2021-10-29 DIAGNOSIS — R531 Weakness: Secondary | ICD-10-CM | POA: Diagnosis not present

## 2021-10-29 DIAGNOSIS — N189 Chronic kidney disease, unspecified: Secondary | ICD-10-CM | POA: Diagnosis not present

## 2021-10-29 DIAGNOSIS — F419 Anxiety disorder, unspecified: Secondary | ICD-10-CM | POA: Diagnosis present

## 2021-10-29 DIAGNOSIS — D72829 Elevated white blood cell count, unspecified: Secondary | ICD-10-CM | POA: Diagnosis present

## 2021-10-29 DIAGNOSIS — H900 Conductive hearing loss, bilateral: Secondary | ICD-10-CM | POA: Diagnosis not present

## 2021-10-29 DIAGNOSIS — R112 Nausea with vomiting, unspecified: Secondary | ICD-10-CM | POA: Diagnosis not present

## 2021-10-29 DIAGNOSIS — R6889 Other general symptoms and signs: Secondary | ICD-10-CM | POA: Diagnosis not present

## 2021-10-29 DIAGNOSIS — Z91199 Patient's noncompliance with other medical treatment and regimen due to unspecified reason: Secondary | ICD-10-CM

## 2021-10-29 DIAGNOSIS — E1165 Type 2 diabetes mellitus with hyperglycemia: Secondary | ICD-10-CM | POA: Diagnosis not present

## 2021-10-29 DIAGNOSIS — R338 Other retention of urine: Secondary | ICD-10-CM | POA: Diagnosis not present

## 2021-10-29 DIAGNOSIS — I739 Peripheral vascular disease, unspecified: Secondary | ICD-10-CM | POA: Diagnosis not present

## 2021-10-29 DIAGNOSIS — I214 Non-ST elevation (NSTEMI) myocardial infarction: Secondary | ICD-10-CM | POA: Diagnosis not present

## 2021-10-29 DIAGNOSIS — Z8546 Personal history of malignant neoplasm of prostate: Secondary | ICD-10-CM | POA: Diagnosis not present

## 2021-10-29 DIAGNOSIS — R000 Tachycardia, unspecified: Secondary | ICD-10-CM | POA: Diagnosis not present

## 2021-10-29 DIAGNOSIS — E1121 Type 2 diabetes mellitus with diabetic nephropathy: Secondary | ICD-10-CM | POA: Diagnosis present

## 2021-10-29 DIAGNOSIS — G4700 Insomnia, unspecified: Secondary | ICD-10-CM | POA: Diagnosis not present

## 2021-10-29 DIAGNOSIS — N1330 Unspecified hydronephrosis: Secondary | ICD-10-CM | POA: Diagnosis not present

## 2021-10-29 DIAGNOSIS — U071 COVID-19: Secondary | ICD-10-CM | POA: Diagnosis not present

## 2021-10-29 DIAGNOSIS — I129 Hypertensive chronic kidney disease with stage 1 through stage 4 chronic kidney disease, or unspecified chronic kidney disease: Secondary | ICD-10-CM | POA: Diagnosis not present

## 2021-10-29 DIAGNOSIS — Z7401 Bed confinement status: Secondary | ICD-10-CM | POA: Diagnosis not present

## 2021-10-29 DIAGNOSIS — Z91148 Patient's other noncompliance with medication regimen for other reason: Secondary | ICD-10-CM

## 2021-10-29 DIAGNOSIS — R270 Ataxia, unspecified: Secondary | ICD-10-CM | POA: Diagnosis not present

## 2021-10-29 HISTORY — PX: CORONARY STENT INTERVENTION: CATH118234

## 2021-10-29 HISTORY — PX: LEFT HEART CATH AND CORONARY ANGIOGRAPHY: CATH118249

## 2021-10-29 LAB — BASIC METABOLIC PANEL
Anion gap: 4 — ABNORMAL LOW (ref 5–15)
BUN: 38 mg/dL — ABNORMAL HIGH (ref 8–23)
CO2: 27 mmol/L (ref 22–32)
Calcium: 7.6 mg/dL — ABNORMAL LOW (ref 8.9–10.3)
Chloride: 110 mmol/L (ref 98–111)
Creatinine, Ser: 1.31 mg/dL — ABNORMAL HIGH (ref 0.61–1.24)
GFR, Estimated: 60 mL/min — ABNORMAL LOW (ref >60)
Glucose, Bld: 191 mg/dL — ABNORMAL HIGH (ref 70–99)
Potassium: 3.5 mmol/L (ref 3.5–5.1)
Sodium: 141 mmol/L (ref 135–145)

## 2021-10-29 LAB — CBC WITH DIFFERENTIAL/PLATELET
Abs Immature Granulocytes: 0.19 10*3/uL — ABNORMAL HIGH (ref 0.00–0.07)
Basophils Absolute: 0.1 10*3/uL (ref 0.0–0.1)
Basophils Relative: 0 %
Eosinophils Absolute: 0 10*3/uL (ref 0.0–0.5)
Eosinophils Relative: 0 %
HCT: 57.4 % — ABNORMAL HIGH (ref 39.0–52.0)
Hemoglobin: 19.9 g/dL — ABNORMAL HIGH (ref 13.0–17.0)
Immature Granulocytes: 1 %
Lymphocytes Relative: 11 %
Lymphs Abs: 1.5 10*3/uL (ref 0.7–4.0)
MCH: 29.4 pg (ref 26.0–34.0)
MCHC: 34.7 g/dL (ref 30.0–36.0)
MCV: 84.8 fL (ref 80.0–100.0)
Monocytes Absolute: 0.8 10*3/uL (ref 0.1–1.0)
Monocytes Relative: 6 %
Neutro Abs: 10.7 10*3/uL — ABNORMAL HIGH (ref 1.7–7.7)
Neutrophils Relative %: 82 %
Platelets: 218 10*3/uL (ref 150–400)
RBC: 6.77 MIL/uL — ABNORMAL HIGH (ref 4.22–5.81)
RDW: 13.3 % (ref 11.5–15.5)
WBC: 13.3 10*3/uL — ABNORMAL HIGH (ref 4.0–10.5)
nRBC: 0 % (ref 0.0–0.2)

## 2021-10-29 LAB — URINALYSIS, ROUTINE W REFLEX MICROSCOPIC
Bacteria, UA: NONE SEEN
Bilirubin Urine: NEGATIVE
Glucose, UA: >=500 mg/dL — AB
Ketones, ur: NEGATIVE mg/dL
Leukocytes,Ua: NEGATIVE
Nitrite: NEGATIVE
Protein, ur: 100 mg/dL — AB
Specific Gravity, Urine: 1.02 (ref 1.005–1.030)
pH: 5 (ref 5.0–8.0)

## 2021-10-29 LAB — I-STAT CHEM 8, ED
BUN: 43 mg/dL — ABNORMAL HIGH (ref 8–23)
Calcium, Ion: 1.15 mmol/L (ref 1.15–1.40)
Chloride: 100 mmol/L (ref 98–111)
Creatinine, Ser: 1.6 mg/dL — ABNORMAL HIGH (ref 0.61–1.24)
Glucose, Bld: 208 mg/dL — ABNORMAL HIGH (ref 70–99)
HCT: 53 % — ABNORMAL HIGH (ref 39.0–52.0)
Hemoglobin: 18 g/dL — ABNORMAL HIGH (ref 13.0–17.0)
Potassium: 4.3 mmol/L (ref 3.5–5.1)
Sodium: 138 mmol/L (ref 135–145)
TCO2: 27 mmol/L (ref 22–32)

## 2021-10-29 LAB — TROPONIN I (HIGH SENSITIVITY)
Troponin I (High Sensitivity): 568 ng/L (ref <18)
Troponin I (High Sensitivity): 3473 ng/L (ref <18)
Troponin I (High Sensitivity): 9554 ng/L (ref <18)
Troponin I (High Sensitivity): 10903 ng/L (ref <18)

## 2021-10-29 LAB — POCT ACTIVATED CLOTTING TIME
Activated Clotting Time: 702 seconds
Activated Clotting Time: 287 seconds

## 2021-10-29 LAB — COMPREHENSIVE METABOLIC PANEL
ALT: 30 U/L (ref 0–44)
AST: 23 U/L (ref 15–41)
Albumin: 4.2 g/dL (ref 3.5–5.0)
Alkaline Phosphatase: 133 U/L — ABNORMAL HIGH (ref 38–126)
Anion gap: 12 (ref 5–15)
BUN: 54 mg/dL — ABNORMAL HIGH (ref 8–23)
CO2: 26 mmol/L (ref 22–32)
Calcium: 10.2 mg/dL (ref 8.9–10.3)
Chloride: 97 mmol/L — ABNORMAL LOW (ref 98–111)
Creatinine, Ser: 1.79 mg/dL — ABNORMAL HIGH (ref 0.61–1.24)
GFR, Estimated: 41 mL/min — ABNORMAL LOW (ref >60)
Glucose, Bld: 352 mg/dL — ABNORMAL HIGH (ref 70–99)
Potassium: 4.5 mmol/L (ref 3.5–5.1)
Sodium: 135 mmol/L (ref 135–145)
Total Bilirubin: 1.1 mg/dL (ref 0.3–1.2)
Total Protein: 7.9 g/dL (ref 6.5–8.1)

## 2021-10-29 LAB — LIPID PANEL
Cholesterol: 149 mg/dL (ref 0–200)
HDL: 28 mg/dL — ABNORMAL LOW (ref >40)
LDL Cholesterol: 94 mg/dL (ref 0–99)
Total CHOL/HDL Ratio: 5.3 RATIO
Triglycerides: 137 mg/dL (ref <150)
VLDL: 27 mg/dL (ref 0–40)

## 2021-10-29 LAB — TECHNOLOGIST SMEAR REVIEW: Plt Morphology: NORMAL

## 2021-10-29 LAB — HIV ANTIBODY (ROUTINE TESTING W REFLEX): HIV Screen 4th Generation wRfx: NONREACTIVE

## 2021-10-29 LAB — CK: Total CK: 132 U/L (ref 49–397)

## 2021-10-29 LAB — LIPASE, BLOOD: Lipase: 31 U/L (ref 11–51)

## 2021-10-29 LAB — GLUCOSE, CAPILLARY
Glucose-Capillary: 204 mg/dL — ABNORMAL HIGH (ref 70–99)
Glucose-Capillary: 207 mg/dL — ABNORMAL HIGH (ref 70–99)

## 2021-10-29 LAB — CBG MONITORING, ED: Glucose-Capillary: 253 mg/dL — ABNORMAL HIGH (ref 70–99)

## 2021-10-29 LAB — HEPARIN LEVEL (UNFRACTIONATED): Heparin Unfractionated: 0.4 IU/mL (ref 0.30–0.70)

## 2021-10-29 SURGERY — LEFT HEART CATH AND CORONARY ANGIOGRAPHY
Anesthesia: LOCAL

## 2021-10-29 MED ORDER — HEPARIN (PORCINE) 25000 UT/250ML-% IV SOLN
1000.0000 [IU]/h | INTRAVENOUS | Status: DC
  Administered 2021-10-29: 1000 [IU]/h via INTRAVENOUS
  Filled 2021-10-29: qty 250

## 2021-10-29 MED ORDER — SODIUM CHLORIDE 0.9 % WEIGHT BASED INFUSION: 1.0000 mL/kg/h | INTRAVENOUS | Status: DC

## 2021-10-29 MED ORDER — HEPARIN SODIUM (PORCINE) 1000 UNIT/ML IJ SOLN
INTRAMUSCULAR | Status: AC
  Filled 2021-10-29: qty 10

## 2021-10-29 MED ORDER — SODIUM CHLORIDE 0.9% FLUSH
3.0000 mL | Freq: Two times a day (BID) | INTRAVENOUS | Status: DC
  Administered 2021-10-30 (×3): 3 mL via INTRAVENOUS

## 2021-10-29 MED ORDER — NITROGLYCERIN 1 MG/10 ML FOR IR/CATH LAB
INTRA_ARTERIAL | Status: AC
  Filled 2021-10-29: qty 10

## 2021-10-29 MED ORDER — SODIUM CHLORIDE 0.9% FLUSH: 3.0000 mL | INTRAVENOUS | Status: DC | PRN

## 2021-10-29 MED ORDER — INSULIN ASPART 100 UNIT/ML IJ SOLN
0.0000 [IU] | Freq: Three times a day (TID) | INTRAMUSCULAR | Status: DC
  Administered 2021-10-30: 5 [IU] via SUBCUTANEOUS
  Administered 2021-10-30: 8 [IU] via SUBCUTANEOUS
  Administered 2021-10-30 – 2021-10-31 (×2): 5 [IU] via SUBCUTANEOUS
  Administered 2021-10-31: 3 [IU] via SUBCUTANEOUS

## 2021-10-29 MED ORDER — ONDANSETRON HCL 4 MG/2ML IJ SOLN
4.0000 mg | Freq: Once | INTRAMUSCULAR | Status: AC
  Administered 2021-10-29: 4 mg via INTRAVENOUS
  Filled 2021-10-29: qty 2

## 2021-10-29 MED ORDER — INSULIN GLARGINE-YFGN 100 UNIT/ML ~~LOC~~ SOLN
10.0000 [IU] | Freq: Every day | SUBCUTANEOUS | Status: DC
  Administered 2021-10-29: 10 [IU] via SUBCUTANEOUS
  Filled 2021-10-29 (×2): qty 0.1

## 2021-10-29 MED ORDER — SODIUM CHLORIDE 0.9 % IV SOLN: 250.0000 mL | INTRAVENOUS | Status: DC | PRN

## 2021-10-29 MED ORDER — ASPIRIN 81 MG PO CHEW: 81.0000 mg | CHEWABLE_TABLET | ORAL | Status: DC

## 2021-10-29 MED ORDER — ASPIRIN 81 MG PO TBEC
81.0000 mg | DELAYED_RELEASE_TABLET | Freq: Every day | ORAL | Status: DC
  Administered 2021-10-30 – 2021-10-31 (×2): 81 mg via ORAL
  Filled 2021-10-29 (×2): qty 1

## 2021-10-29 MED ORDER — NITROGLYCERIN IN D5W 200-5 MCG/ML-% IV SOLN
0.0000 ug/min | INTRAVENOUS | Status: DC
  Administered 2021-10-29: 5 ug/min via INTRAVENOUS
  Filled 2021-10-29: qty 250

## 2021-10-29 MED ORDER — SODIUM CHLORIDE 0.9 % IV SOLN: INTRAVENOUS | Status: AC

## 2021-10-29 MED ORDER — HEPARIN SODIUM (PORCINE) 1000 UNIT/ML IJ SOLN
INTRAMUSCULAR | Status: DC | PRN
  Administered 2021-10-29: 3500 [IU] via INTRAVENOUS
  Administered 2021-10-29: 4000 [IU] via INTRAVENOUS

## 2021-10-29 MED ORDER — SODIUM CHLORIDE 0.9 % IV SOLN
250.0000 mL | INTRAVENOUS | Status: DC | PRN
Start: 2021-10-29 — End: 2021-10-29

## 2021-10-29 MED ORDER — ASPIRIN 81 MG PO CHEW
324.0000 mg | CHEWABLE_TABLET | Freq: Once | ORAL | Status: AC
  Administered 2021-10-29: 324 mg via ORAL
  Filled 2021-10-29: qty 4

## 2021-10-29 MED ORDER — HEPARIN (PORCINE) IN NACL 1000-0.9 UT/500ML-% IV SOLN
INTRAVENOUS | Status: AC
  Filled 2021-10-29: qty 1000

## 2021-10-29 MED ORDER — VERAPAMIL HCL 2.5 MG/ML IV SOLN
INTRAVENOUS | Status: AC
  Filled 2021-10-29: qty 2

## 2021-10-29 MED ORDER — TICAGRELOR 90 MG PO TABS
ORAL_TABLET | ORAL | Status: DC | PRN
  Administered 2021-10-29: 180 mg via ORAL

## 2021-10-29 MED ORDER — LIDOCAINE HCL (PF) 1 % IJ SOLN
INTRAMUSCULAR | Status: AC
  Filled 2021-10-29: qty 30

## 2021-10-29 MED ORDER — VERAPAMIL HCL 2.5 MG/ML IV SOLN
INTRAVENOUS | Status: DC | PRN
  Administered 2021-10-29: 10 mL via INTRA_ARTERIAL

## 2021-10-29 MED ORDER — HEPARIN BOLUS VIA INFUSION
4000.0000 [IU] | Freq: Once | INTRAVENOUS | Status: AC
  Administered 2021-10-29: 4000 [IU] via INTRAVENOUS
  Filled 2021-10-29: qty 4000

## 2021-10-29 MED ORDER — SODIUM CHLORIDE 0.9% FLUSH: 3.0000 mL | Freq: Two times a day (BID) | INTRAVENOUS | Status: DC

## 2021-10-29 MED ORDER — AMLODIPINE BESYLATE 5 MG PO TABS
5.0000 mg | ORAL_TABLET | Freq: Every day | ORAL | Status: DC
  Administered 2021-10-29 – 2021-10-31 (×3): 5 mg via ORAL
  Filled 2021-10-29 (×3): qty 1

## 2021-10-29 MED ORDER — MIDAZOLAM HCL 2 MG/2ML IJ SOLN
INTRAMUSCULAR | Status: AC
  Filled 2021-10-29: qty 2

## 2021-10-29 MED ORDER — IOHEXOL 350 MG/ML SOLN
INTRAVENOUS | Status: DC | PRN
  Administered 2021-10-29: 110 mL

## 2021-10-29 MED ORDER — ACETAMINOPHEN 325 MG PO TABS
650.0000 mg | ORAL_TABLET | Freq: Four times a day (QID) | ORAL | Status: DC | PRN
  Administered 2021-10-29: 650 mg via ORAL
  Filled 2021-10-29: qty 2

## 2021-10-29 MED ORDER — ACETAMINOPHEN 650 MG RE SUPP: 650.0000 mg | Freq: Four times a day (QID) | RECTAL | Status: DC | PRN

## 2021-10-29 MED ORDER — TICAGRELOR 90 MG PO TABS
90.0000 mg | ORAL_TABLET | Freq: Two times a day (BID) | ORAL | Status: DC
  Administered 2021-10-30 – 2021-10-31 (×3): 90 mg via ORAL
  Filled 2021-10-29 (×3): qty 1

## 2021-10-29 MED ORDER — FINASTERIDE 5 MG PO TABS
5.0000 mg | ORAL_TABLET | Freq: Every day | ORAL | Status: DC
  Administered 2021-10-30 – 2021-10-31 (×2): 5 mg via ORAL
  Filled 2021-10-29 (×2): qty 1

## 2021-10-29 MED ORDER — HEPARIN (PORCINE) IN NACL 1000-0.9 UT/500ML-% IV SOLN
INTRAVENOUS | Status: DC | PRN
  Administered 2021-10-29 (×2): 500 mL

## 2021-10-29 MED ORDER — NITROGLYCERIN 0.4 MG SL SUBL
0.4000 mg | SUBLINGUAL_TABLET | SUBLINGUAL | Status: DC | PRN
  Administered 2021-10-29 (×3): 0.4 mg via SUBLINGUAL
  Filled 2021-10-29 (×3): qty 1

## 2021-10-29 MED ORDER — NITROGLYCERIN 1 MG/10 ML FOR IR/CATH LAB
INTRA_ARTERIAL | Status: DC | PRN
  Administered 2021-10-29 (×2): 200 ug via INTRACORONARY

## 2021-10-29 MED ORDER — HEPARIN SODIUM (PORCINE) 5000 UNIT/ML IJ SOLN
5000.0000 [IU] | Freq: Three times a day (TID) | INTRAMUSCULAR | Status: DC
  Administered 2021-10-30 – 2021-10-31 (×3): 5000 [IU] via SUBCUTANEOUS
  Filled 2021-10-29 (×4): qty 1

## 2021-10-29 MED ORDER — LIDOCAINE HCL (PF) 1 % IJ SOLN
INTRAMUSCULAR | Status: DC | PRN
  Administered 2021-10-29: 2 mL via INTRADERMAL

## 2021-10-29 MED ORDER — FENTANYL CITRATE (PF) 100 MCG/2ML IJ SOLN
INTRAMUSCULAR | Status: DC | PRN
Start: 2021-10-29 — End: 2021-10-29
  Administered 2021-10-29 (×2): 25 ug via INTRAVENOUS

## 2021-10-29 MED ORDER — ATORVASTATIN CALCIUM 80 MG PO TABS
80.0000 mg | ORAL_TABLET | Freq: Every day | ORAL | Status: DC
  Administered 2021-10-29 – 2021-10-31 (×3): 80 mg via ORAL
  Filled 2021-10-29: qty 1
  Filled 2021-10-29: qty 2
  Filled 2021-10-29: qty 1

## 2021-10-29 MED ORDER — MIDAZOLAM HCL 2 MG/2ML IJ SOLN
INTRAMUSCULAR | Status: DC | PRN
  Administered 2021-10-29 (×2): 1 mg via INTRAVENOUS

## 2021-10-29 MED ORDER — METOPROLOL TARTRATE 12.5 MG HALF TABLET
12.5000 mg | ORAL_TABLET | Freq: Two times a day (BID) | ORAL | Status: DC
  Administered 2021-10-29 – 2021-10-31 (×5): 12.5 mg via ORAL
  Filled 2021-10-29 (×5): qty 1

## 2021-10-29 MED ORDER — SODIUM CHLORIDE 0.9 % WEIGHT BASED INFUSION: 1.0000 mL/kg/h | INTRAVENOUS | Status: AC

## 2021-10-29 MED ORDER — INSULIN ASPART 100 UNIT/ML IJ SOLN
0.0000 [IU] | Freq: Every day | INTRAMUSCULAR | Status: DC
  Administered 2021-10-29 – 2021-10-30 (×2): 2 [IU] via SUBCUTANEOUS

## 2021-10-29 MED ORDER — FENTANYL CITRATE (PF) 100 MCG/2ML IJ SOLN
INTRAMUSCULAR | Status: AC
  Filled 2021-10-29: qty 2

## 2021-10-29 SURGICAL SUPPLY — 19 items
BAG SNAP BAND KOVER 36X36 (MISCELLANEOUS) ×1 IMPLANT
BALLN SAPPHIRE 2.0X12 (BALLOONS) ×2
BALLOON SAPPHIRE 2.0X12 (BALLOONS) IMPLANT
CATH 5FR JL3.5 JR4 ANG PIG MP (CATHETERS) ×1 IMPLANT
CATH VISTA GUIDE 6FR XBLAD3.5 (CATHETERS) ×1 IMPLANT
DEVICE RAD COMP TR BAND LRG (VASCULAR PRODUCTS) ×1 IMPLANT
GLIDESHEATH SLEND SS 6F .021 (SHEATH) ×1 IMPLANT
GUIDEWIRE INQWIRE 1.5J.035X260 (WIRE) IMPLANT
INQWIRE 1.5J .035X260CM (WIRE) ×2
KIT ENCORE 26 ADVANTAGE (KITS) ×1 IMPLANT
KIT HEART LEFT (KITS) ×3 IMPLANT
PACK CARDIAC CATHETERIZATION (CUSTOM PROCEDURE TRAY) ×3 IMPLANT
SHEATH PROBE COVER 6X72 (BAG) ×1 IMPLANT
STENT SYNERGY XD 2.50X16 (Permanent Stent) IMPLANT
SYNERGY XD 2.50X16 (Permanent Stent) ×2 IMPLANT
SYR MEDRAD MARK 7 150ML (SYRINGE) ×3 IMPLANT
TRANSDUCER W/STOPCOCK (MISCELLANEOUS) ×3 IMPLANT
TUBING CIL FLEX 10 FLL-RA (TUBING) ×3 IMPLANT
WIRE ASAHI PROWATER 180CM (WIRE) ×1 IMPLANT

## 2021-10-29 NOTE — ED Notes (Signed)
Crit trop 568 relayed to rancour at 0500

## 2021-10-29 NOTE — ED Notes (Signed)
ED Provider at bedside. 

## 2021-10-29 NOTE — ED Notes (Signed)
Checked cbg it was 86 notified RN of blood sugar patient is resting with call bell in reach

## 2021-10-29 NOTE — ED Notes (Signed)
Pt experiencing chest pain, denies wanting any meds to help.

## 2021-10-29 NOTE — ED Triage Notes (Addendum)
PT COMING FROM HOME C/O CP, ABD PAIN AND NVD X2. CBG 403. BP 180/90 HR 70. CHEST PAIN IS CENTRAL AND DOES NOT RADIATE.Marland Kitchen DENIES SOB. PT WAS SITTING IN THE SUN ALL DAY

## 2021-10-29 NOTE — ED Notes (Signed)
Pt states "My chest pain is more tolerable now and I am comfortable."

## 2021-10-29 NOTE — Consult Note (Addendum)
Cardiology Consultation:   Patient ID: [name] MRN: [id number]; DOB: 09/21/1955  Admit date: 10/29/2021 Date of Consult: 10/29/2021  PCP:  [name] No   [facility] Providers Cardiologist:  Randy Grooms, MD   Patient Profile:   [name] is a 67 y.o. male with a hx of medical noncompliance, hypertension, depression, anxiety, anemia, postpolio syndrome, DM, CKD stage III, hyperlipidemia, prostate cancer, and GERD who is being seen 10/29/2021 for the evaluation of NSTEMI at the request of Dr. Dayna Black.  History of Present Illness:   [name] was seen during hospitalization in October 2022 with chest pain.  EKG was nonischemic and HST was negative x3.  Given symptoms and risk factors, he underwent nuclear stress test 01/07/2021 that showed no reversible ischemia.  Echocardiogram at that time showed LVEF 60 to 02%, grade 1 diastolic dysfunction, and no significant valvular disease.  Aortic valve sclerosis is noted without evidence of aortic stenosis.  He was hospitalized 08/2021 for dizziness.  No arrhythmia or heart block noted on telemetry.  Minimal carotid artery disease on ultrasound.  Dizziness felt not cardiac related.  Patient did have dizziness while on telemetry with no concomitant arrhythmia.  He did not follow-up with cardiology after either hospitalization.  He has had multiple ER visits (18 since Jan 2023) and has a history of leaving AMA.   He presented to [facility] ED with chest pain, abdominal pain, and nausea vomiting with diarrhea x2.  CBG was 403 on arrival and BP was 180/90.  He reported sitting in the sun all day but was unable to keep food down due to emesis.  HST 568 --> 3473 EKG sCR 1.79, near baseline WBC 13.3 Hb 19.9  Cardiology was consulted. History was a bit difficult, pt difficult historian. He lives at home alone. He reports compliance on benicar. He was walking to the store yesterday at 3pm and had to sit on the curb due to weakness and fatigue. He finally  made it home to rest and developed chest pressure, intermittent SOB, diaphoresis and N/V. No diarrhea. CP rated as a pressure, band around his chest, has been intermittent, rated 8/10. He is currently comfortable. He has received nitro x 2 + 324 mg ASA and heparin gtt.   With further questioning, he states he has been getting chest pain with walking, suspicious for angina.    Past Medical History:  Diagnosis Date   Acute kidney injury superimposed on chronic kidney disease ([place]) 02/10/2020   Acute on chronic kidney failure (HCC) 09/28/2019   Closed nondisplaced spiral fracture of shaft of right tibia    Community acquired pneumonia 4/0/9735   Complicated urinary tract infection 09/06/2016   Depression    Diabetes mellitus without complication (HCC)    GERD (gastroesophageal reflux disease)    Gout    right foot   Hemorrhoids    Hypertension    Hypertension    Macular degeneration    Microcytic anemia    PNA (pneumonia) 03/26/2021   Prostate cancer ([place]) 08/2009   Rectal bleeding 07/31/2016   Tubular adenoma     Past Surgical History:  Procedure Laterality Date   CYSTOSCOPY WITH RETROGRADE PYELOGRAM, URETEROSCOPY AND STENT PLACEMENT Right 07/31/2016   Procedure: CYSTOSCOPY WITH RIGHT  RETROGRADE PYELOGRAM, URETEROSCOPY;  Surgeon: [name], MD;  Location: [facility];  Service: Urology;  Laterality: Right;   INGUINAL HERNIA REPAIR     Left ankle joint fusion  1981   PROSTATE BIOPSY     x  2   URETERAL REIMPLANTION  07/31/2016   Procedure: URETERAL REIMPLANT, right boari flap, right psoas hitch;  Surgeon: [name], MD;  Location: [facility];  Service: Urology;;     Home Medications:  Prior to Admission medications   Medication Sig Start Date End Date Taking? Authorizing Provider  aspirin EC 81 MG tablet Take 81 mg by mouth daily with breakfast.     [provider]  atorvastatin (LIPITOR) 40 MG tablet Take 1 tablet (40 mg total) by mouth daily. 09/07/21   Hosie Poisson, MD  finasteride (PROSCAR) 5 MG tablet Take 1 tablet (5 mg total) by mouth daily. 09/07/21   Hosie Poisson, MD  hydrOXYzine (ATARAX) 25 MG tablet Take 1 tablet (25 mg total) by mouth 3 (three) times daily as needed for anxiety. 09/11/21   [place] Eric J, DO  insulin aspart (NOVOLOG) 100 UNIT/ML injection Inject 4 Units into the skin 3 (three) times daily with meals. 09/11/21   [place] Eric J, DO  insulin glargine-yfgn (SEMGLEE) 100 UNIT/ML injection Inject 0.2 mLs (20 Units total) into the skin daily. 09/12/21   [place] Donnamarie Poag, DO  meclizine (ANTIVERT) 12.5 MG tablet Take 1 tablet (12.5 mg total) by mouth 2 (two) times daily as needed for dizziness. 09/06/21   Hosie Poisson, MD  methylPREDNISolone (MEDROL DOSEPAK) 4 MG TBPK tablet Day 1: Take 2 tablets for breakfast, 1 tab after lunch and after supper, 2 tabs at bedtime.  Day 2: Take 1 tab before breakfast, 1 tablet after lunch and after supper, 2 tablets at bedtime.  Day 3: Take 1 tablet before breakfast, 1 tablet for lunch, 1 tablet after supper, 1 tablet at bedtime.  Day 4: Take 1 tablet before breakfast, 1 tablet after lunch, 1 tablet at bedtime.  Day 5: Take 1 tab before breakfast and at bedtime.  Day 6: Take 1 tablet before breakfast 10/20/21   Azucena Cecil, PA-C  Multiple Vitamin (MULTIVITAMIN WITH MINERALS) TABS tablet Take 1 tablet by mouth daily.    [provider]  Semaglutide (RYBELSUS) 14 MG TABS Take 1 tablet by mouth daily. Take 30 minutes before breakfast 12/06/20   [name], [name]  sertraline (ZOLOFT) 50 MG tablet Take 1 tablet (50 mg total) by mouth daily. 09/12/21   [place] Donnamarie Poag, DO  Tetrahydrozoline HCl (VISINE OP) Place 1 drop into both eyes daily as needed (itching/irritation).    [provider]  traZODone (DESYREL) 50 MG tablet Take 1 tablet (50 mg total) by mouth at bedtime as needed for sleep. 03/30/21   Jonetta Osgood, MD    Inpatient Medications: Scheduled Meds:  Continuous Infusions:   heparin 1,000 Units/hr (10/29/21 0849)   PRN Meds: nitroGLYCERIN  Allergies:    Allergies  Allergen Reactions   Haldol [Haloperidol] Other (See Comments)    Per family cause life threatening complications to patient Per Pt, it causes "complications with my mouth"    Social History:   Social History   Socioeconomic History   Marital status: Divorced    Spouse name: Not on file   Number of children: 0   Years of education: Not on file   Highest education level: Not on file  Occupational History   Occupation: retired  Tobacco Use   Smoking status: Never   Smokeless tobacco: Never  Vaping Use   Vaping Use: Never used  Substance and Sexual Activity   Alcohol use: Not Currently   Drug use: No   Sexual activity: Yes  Other Topics Concern   Not on file  Social History Narrative   Not on file   Social Determinants of Health   Financial Resource Strain: Low Risk  (12/10/2019)   Overall Financial Resource Strain (CARDIA)    Difficulty of Paying Living Expenses: Not hard at all  Food Insecurity: No Food Insecurity (08/26/2019)   Hunger Vital Sign    Worried About Running Out of Food in the Last Year: Never true    Ran Out of Food in the Last Year: Never true  Transportation Needs: No Transportation Needs (08/26/2019)   PRAPARE - Hydrologist (Medical): No    Lack of Transportation (Non-Medical): No  Physical Activity: Insufficiently Active (08/26/2019)   Exercise Vital Sign    Days of Exercise per Week: 3 days    Minutes of Exercise per Session: 10 min  Stress: No Stress Concern Present (08/26/2019)   [place]    Feeling of Stress : Only a little  Social Connections: Not on file  Intimate Partner Violence: Not At Risk (08/25/2018)   Humiliation, Afraid, Rape, and Kick questionnaire    Fear of Current or Ex-Partner: No    Emotionally Abused: No    Physically Abused: No     Sexually Abused: No    Family History:    Family History  Problem Relation Age of Onset   Lung cancer Mother        Deceased   Pancreatic cancer Father    Heart disease Father        Deceased   Throat cancer Brother    Lung cancer Maternal Uncle        nephew   Diabetes Other      ROS:  Please see the history of present illness.   All other ROS reviewed and negative.     Physical Exam/Data:   Vitals:   10/29/21 0800 10/29/21 0815 10/29/21 0830 10/29/21 0845  BP: (!) 160/96 (!) 149/98 (!) 154/94 (!) 176/93  Pulse: 64 74 63 66  Resp: '14 15 16 16  '$ Temp:      TempSrc:      SpO2: 98% 98% 97% 99%  Weight:      Height:       No intake or output data in the 24 hours ending 10/29/21 0930    10/29/2021    1:49 AM 10/20/2021   12:04 PM 09/24/2021    3:39 PM  Last 3 Weights  Weight (lbs) 164 lb 14.5 oz 165 lb 175 lb  Weight (kg) 74.8 kg 74.844 kg 79.379 kg     Body mass index is 26.62 kg/m.  General:  Well nourished, well developed, in no acute distress HEENT: normal Neck: no JVD Vascular: No carotid bruits; Distal pulses 2+ bilaterally Cardiac:  normal S1, S2; RRR; no murmur  Lungs:  clear to auscultation bilaterally, no wheezing, rhonchi or rales  Abd: soft, nontender, no hepatomegaly  Ext: no edema Musculoskeletal:  No deformities, BUE and BLE strength normal and equal Skin: warm and dry  Neuro:  CNs 2-12 intact, no focal abnormalities noted Psych:  Normal affect   EKG:  The EKG was personally reviewed and demonstrates:  sinus rhythm with HR 61 Telemetry:  Telemetry was personally reviewed and demonstrates:  sinus rhythm HR 60-70s, PVCs  Relevant CV Studies:  Echo 09/04/21:  1. Left ventricular ejection fraction, by estimation, is 60 to 65%. The  left ventricle has normal  function. The left ventricle has no regional  wall motion abnormalities. There is mild concentric left ventricular  hypertrophy. Left ventricular diastolic  parameters are consistent with  Grade I diastolic dysfunction (impaired  relaxation).   2. Right ventricular systolic function is normal. The right ventricular  size is normal.   3. The mitral valve is abnormal. No evidence of mitral valve  regurgitation. No evidence of mitral stenosis.   4. The aortic valve was not well visualized. Aortic valve regurgitation  is not visualized. Aortic valve sclerosis is present, with no evidence of  aortic valve stenosis.   5. The inferior vena cava is normal in size with greater than 50%  respiratory variability, suggesting right atrial pressure of 3 mmHg.    Nuclear stress test 12/2020 IMPRESSION: 1. No reversible ischemia or infarction.   2. Normal left ventricular wall motion.   3. Left ventricular ejection fraction 58%   4. Non invasive risk stratification*: Low   Laboratory Data:  High Sensitivity Troponin:   Recent Labs  Lab 10/29/21 0410 10/29/21 0611  TROPONINIHS 568* 3,473*     Chemistry Recent Labs  Lab 10/29/21 0410  NA 135  K 4.5  CL 97*  CO2 26  GLUCOSE 352*  BUN 54*  CREATININE 1.79*  CALCIUM 10.2  GFRNONAA 41*  ANIONGAP 12    Recent Labs  Lab 10/29/21 0410  PROT 7.9  ALBUMIN 4.2  AST 23  ALT 30  ALKPHOS 133*  BILITOT 1.1   Lipids No results for input(s): "CHOL", "TRIG", "HDL", "LABVLDL", "LDLCALC", "CHOLHDL" in the last 168 hours.  Hematology Recent Labs  Lab 10/29/21 0410  WBC 13.3*  RBC 6.77*  HGB 19.9*  HCT 57.4*  MCV 84.8  MCH 29.4  MCHC 34.7  RDW 13.3  PLT 218   Thyroid No results for input(s): "TSH", "FREET4" in the last 168 hours.  BNPNo results for input(s): "BNP", "PROBNP" in the last 168 hours.  DDimer No results for input(s): "DDIMER" in the last 168 hours.   Radiology/Studies:  DG Chest 2 View  Result Date: 10/29/2021 CLINICAL DATA:  Chest pain. EXAM: CHEST - 2 VIEW COMPARISON:  September 24, 2021 FINDINGS: The heart size and mediastinal contours are within normal limits. Both lungs are clear. The visualized  skeletal structures are unremarkable. IMPRESSION: No active cardiopulmonary disease. Electronically Signed   By: Virgina Norfolk M.D.   On: 10/29/2021 02:59     Assessment and Plan:   NSTEMI Chest pain - HS troponin 568 --> 3473 - EKG does not appear ischemic - pt reports symptoms concerning for angina - pt continues to have intermittent CP on heparin - discussed possible heart catheterization and kidney injury with contrast - continue ASA, heparin, statin - would gently hydrate today, I have placed heart cath orders for tomorrow with hydration starting at 8PM tonight   Hypertension He reports bein gon benicar, this is not listed on home medications - will start amlodipine, hold ARB/ACEI for now   Hyperlipidemia with LDL goal < 70 May consider LDL goal lower than 55 given uncontrolled DM 12/06/2020: Cholesterol, Total 192; HDL 39; LDL Chol Calc (NIH) 115; Triglycerides 218 Increase to 80 mg lipitor   CKD stage III - sCr 1.79 - baseline apepars 1.5-1.7 - gentle hydration   DM A1c 11% UA with protein and glucose Appreciate assistance with DM medications He tells me he isn't taking insulin, listed on home meds   Leukocytosis WBC 13.3 UA does not appear infectious   Hx  of noncompliance Was just discharged from SNF due to behavioral outburtsts. Has a history of refusing vitals and telemetry. Seems to be cooperative currently. He reports compliance with his medications, although does miss some doses.    Risk Assessment/Risk Scores:     TIMI Risk Score for Unstable Angina or Non-ST Elevation MI:   The patient's TIMI risk score is 5, which indicates a 26% risk of all cause mortality, new or recurrent myocardial infarction or need for urgent revascularization in the next 14 days.     For questions or updates, please contact [place] Please consult [email or web address] for contact info under    Signed, Ledora Bottcher, PA  10/29/2021 9:30 AM  History and all data  above reviewed.  Patient examined.  The patient presents with chest discomfort.  This was left-sided and into his left arm and up to his jaw.  It has been severe.  Happened at rest essentially after he went to get something to eat yesterday.  His friend noticed that he was pale and went back to his apartment and was extremely fatigued with this discomfort.  He presented to the emergency room.  He was hyperglycemic and hypertensive.  Cardiac enzymes have been elevated as above.  EKG did not demonstrate any acute ST T wave changes.  He has a poor social situation and has only a sister who might check on him.  He gets a monthly check and has a one bedroom apartment but reports that he is not able to take care of himself and that he does not take his meds.   I agree with the findings as above.  The patient exam reveals COR:RRR  ,  Lungs: Clear  ,  Abd: Positive bowel sounds, no rebound no guarding, Ext No edema  .  All available labs, radiology testing, previous records reviewed. Agree with documented assessment and plan. NSTEMI:  I have discussed with him the need for cardiac cath.  I am going to try to hydrate him today and cath electively tomorrow given his CKD.  The patient understands that risks included but are not limited to stroke (1 in 1000), death (1 in 1), kidney failure [usually temporary] (1 in 500), bleeding (1 in 200), allergic reaction [possibly serious] (1 in 200).  The patient understands and agrees to proceed.   He understands that the risk of renal insufficiency is higher.   He is currently having 7 out of 10 chest discomfort so we will start IV nitroglycerin.   HTN: For now I am going to start a beta-blocker.  I will hold off on a ARB until after his cardiac catheterization.   We will however need med titration.  Minus Breeding  10:51 AM  10/29/2021

## 2021-10-29 NOTE — Interval H&P Note (Signed)
History and Physical Interval Note:  10/29/2021 4:32 PM  [name]  has presented today for surgery, with the diagnosis of unstable angina.  The various methods of treatment have been discussed with the patient and family. After consideration of risks, benefits and other options for treatment, the patient has consented to  Procedure(s): LEFT HEART CATH AND CORONARY ANGIOGRAPHY (N/A) as a surgical intervention.  The patient's history has been reviewed, patient examined, no change in status, stable for surgery.  I have reviewed the patient's chart and labs.  Questions were answered to the patient's satisfaction.   Cath Lab Visit (complete for each Cath Lab visit)  Clinical Evaluation Leading to the Procedure:   ACS: Yes.    Non-ACS:    Anginal Classification: CCS IV  Anti-ischemic medical therapy: No Therapy  Non-Invasive Test Results: No non-invasive testing performed  Prior CABG: No previous CABG        Collier Salina [facility] 10/29/2021 4:33 PM

## 2021-10-29 NOTE — Progress Notes (Signed)
ANTICOAGULATION CONSULT NOTE - Initial Consult  Pharmacy Consult for Heparin  Indication: chest pain/ACS  Allergies  Allergen Reactions   Haldol [Haloperidol] Other (See Comments)    Per family cause life threatening complications to patient Per Pt, it causes "complications with my mouth"    Patient Measurements: Height: '5\' 6"'$  (167.6 cm) Weight: 74.8 kg (164 lb 14.5 oz) IBW/kg (Calculated) : 63.8  Vital Signs: Temp: 97.6 F (36.4 C) (08/14 0519) Temp Source: Oral (08/14 0519) BP: 178/89 (08/14 0519) Pulse Rate: 59 (08/14 0519)  Labs: Recent Labs    10/29/21 0410  HGB 19.9*  HCT 57.4*  PLT 218  CREATININE 1.79*  CKTOTAL 132  TROPONINIHS 568*    Estimated Creatinine Clearance: 36.1 mL/min (A) (by C-G formula based on SCr of 1.79 mg/dL (H)).   Medical History: Past Medical History:  Diagnosis Date   Acute kidney injury superimposed on chronic kidney disease ([place]) 02/10/2020   Acute on chronic kidney failure (HCC) 09/28/2019   Closed nondisplaced spiral fracture of shaft of right tibia    Community acquired pneumonia 09/20/1605   Complicated urinary tract infection 09/06/2016   Depression    Diabetes mellitus without complication (HCC)    GERD (gastroesophageal reflux disease)    Gout    right foot   Hemorrhoids    Hypertension    Hypertension    Macular degeneration    Microcytic anemia    PNA (pneumonia) 03/26/2021   Prostate cancer ([place]) 08/2009   Rectal bleeding 07/31/2016   Tubular adenoma     Assessment: 66 y/o M with central chest pain and elevated troponin. Starting heparin. Labs above reviewed. PTA meds reviewed.  Goal of Therapy:  Heparin level 0.3-0.7 units/ml Monitor platelets by anticoagulation protocol: Yes   Plan:  Heparin 4000 units BOLUS Start heparin drip at 1000 units/hr 1400 Heparin level Daily CBC/Heparin level Monitor for bleeding   Narda Bonds, PharmD, BCPS Clinical Pharmacist Phone: [phone]

## 2021-10-29 NOTE — Inpatient Diabetes Management (Signed)
Inpatient Diabetes Program Recommendations  AACE/ADA: New Consensus Statement on Inpatient Glycemic Control (2015)  Target Ranges:  Prepandial:   less than 140 mg/dL      Peak postprandial:   less than 180 mg/dL (1-2 hours)      Critically ill patients:  140 - 180 mg/dL   Lab Results  Component Value Date   GLUCAP 210 (H) 09/25/2021   HGBA1C 11.0 (H) 09/04/2021    Review of Glycemic Control  Latest Reference Range & Units 10/29/21 04:10  Glucose 70 - 99 mg/dL 352 (H)  (H): Data is abnormally high  Diabetes history: DM2 Outpatient Diabetes medications:  Semglee 20 units QD Novolog 4 units TID Rybelsus 14 mg QD  Current orders for Inpatient glycemic control:  None  Inpatient Diabetes Program Recommendations:    Please consider:  Semglee 15 units QD Novolog 0-15 units Q4H if NPO  Will plan on seeing patient when appropriate regarding DM management & A1C of 11%.  Will continue to follow while inpatient.  Thank you, Reche Dixon, MSN, [place] Diabetes Coordinator Inpatient Diabetes Program [phone] (team pager from 8a-5p)

## 2021-10-29 NOTE — Progress Notes (Signed)
Ate [place] sandwich. Sleeping.

## 2021-10-29 NOTE — ED Provider Triage Note (Signed)
Emergency Medicine Provider Triage Evaluation Note  [name] , a 66 y.o. male  was evaluated in triage.  Pt complains of central chest pain.  Emesis x4 and diarrhea this afternoon.  Reports he was out in the sun most of the day today and feels "drained".  CBG 403 with EMS.  Review of Systems  Positive: Chest pain, vomiting Negative: fever  Physical Exam  BP (!) 169/94   Pulse 73   Temp 98.4 F (36.9 C) (Oral)   Resp 18   Ht '5\' 6"'$  (1.676 m)   Wt 74.8 kg   SpO2 97%   BMI 26.62 kg/m   Gen:   Awake, no distress   Resp:  Normal effort  MSK:   Moves extremities without difficulty  Other:  Abdomen soft, non-tender on exam  Medical Decision Making  Medically screening exam initiated at 1:46 AM.  Appropriate orders placed.  [name] was informed that the remainder of the evaluation will be completed by another provider, this initial triage assessment does not replace that evaluation, and the importance of remaining in the ED until their evaluation is complete.  Chest pain, N/V/D.  Also reports out in the sun all day today so some possible heat exposure as well. VSS in triage. EKG, labs including CK, CXR.   Larene Pickett, PA-C 10/29/21 [phone]

## 2021-10-29 NOTE — H&P (Signed)
Date: 10/29/2021               Patient Name:  [name] MRN: [id number]  DOB: 15-Sep-1955 Age / Sex: 66 y.o., male   PCP: Pcp, No         Medical Service: Internal Medicine Teaching Service         Attending Physician: Dr. Jimmye Norman, Elaina Pattee, MD    First Contact: Dr. Sanjuana Mae Pager: [phone]  Second Contact: Dr. Collene Gobble Pager: [phone]       After Hours (After 5p/  First Contact Pager: [phone]  weekends / holidays): Second Contact Pager: [phone]   Chief Concern: Chest pain  History of Present Illness:   [name] is a 66 year old male with past medical history of hypertension, type 2 diabetes, CKD 3A, hyperlipidemia, prostate cancer and depression who presents to the emergency room for chest pain that started last night.  Patient reports feeling weak and tired when he was walking to a grocery store last night.  Patient did not develop chest pain until he got back to apartment.  Reports chest pain in the left side of his chest that radiates to his jaw.  Denies radiation to his arm and back.  Nothing seems to help or aggravate it.  He does not have nitroglycerin at home.  The pain lasted for about 40 minutes before he called EMS.  Patient report diaphoresis and one episode of vomiting.  He denies accompanied shortness of breath or palpitation.  States that pain has improved significantly now after given medications in the emergency room. Patient denies chest pain with exertion before this episode.   Patient reports occasional abdominal pain.  Patient reports occasional bladder incontinence without dysuria or hematuria.  No issue with bowel movements.  Patient was recently admitted in June for recurrent syncope and discharged to [facility].  After leaving the [facility], patient did not have any medication except for what he had at home.  He is only taking Benicar and Rybelsus.  He does not inject insulin due to his vision and unclear instructions.  Patient states that he lives  by himself in an apartment.  The only family member around is his sister but they are not closed.  Patient states that he needs more help at home regarding medication management and some ADLs such as bathing.  Patient reports depressive mood after breaking up with his partner.  He currently not taking medication for his depression.  Patient endorses occasional thoughts that he is better off dead but denies any plan, action or meaning.  He states that he would not hurt himself.  He has no clear protective factors.  In the ED, patient was hypertensive.  Troponin elevated 568 - 3473.  No obvious ST depression or elevation on EKG.  Cardiology was consulted and started him on heparin drip and nitro drip.  Plan for possible heart cath tomorrow.  [facility] will admit for diabetes and CKD management.  Meds:  -Benicar -Rybelsus  Allergies: Allergies as of 10/29/2021 - Review Complete 10/29/2021  Allergen Reaction Noted   Haldol [haloperidol] Other (See Comments) 01/06/2021   Past Medical History:  Diagnosis Date   Acute kidney injury superimposed on chronic kidney disease ([place]) 02/10/2020   Acute on chronic kidney failure (HCC) 09/28/2019   Closed nondisplaced spiral fracture of shaft of right tibia    Community acquired pneumonia 66/08/3014   Complicated urinary tract infection 09/06/2016   Depression    Diabetes mellitus without complication (  HCC)    GERD (gastroesophageal reflux disease)    Gout    right foot   Hemorrhoids    Hypertension    Macular degeneration    Microcytic anemia    PNA (pneumonia) 03/26/2021   Prostate cancer ([place]) 08/2009   Tubular adenoma     Family History:  Family History  Problem Relation Age of Onset   Lung cancer Mother        Deceased   Pancreatic cancer Father    Heart disease Father        Deceased   Throat cancer Brother    Lung cancer Maternal Uncle        nephew   Diabetes Other      Social History:  Lives by himself.  Sister is only family  member Independent with most ADLs but he helps with bathing and medication management Only drink alcohol socially.  Has history of alcohol use disorder many many years ago. Denies smoking or drug use PCP: [name], [name]  Review of Systems: A complete ROS was negative except as per HPI.   Physical Exam: Blood pressure (!) 152/104, pulse 68, temperature 97.7 F (36.5 C), temperature source Oral, resp. rate 11, height '5\' 6"'$  (1.676 m), weight 74.8 kg, SpO2 99 %. Physical Exam Constitutional:      General: He is not in acute distress.    Appearance: He is not ill-appearing.  HENT:     Head: Normocephalic.  Eyes:     General:        Right eye: No discharge.        Left eye: No discharge.     Conjunctiva/sclera: Conjunctivae normal.  Cardiovascular:     Rate and Rhythm: Normal rate and regular rhythm.     Heart sounds: Normal heart sounds. No murmur heard.    Comments: No JVD.  No LE edema. Pulmonary:     Effort: Pulmonary effort is normal. No respiratory distress.     Breath sounds: Normal breath sounds. No wheezing.  Chest:     Chest wall: No tenderness.  Abdominal:     General: Bowel sounds are normal. There is no distension.     Palpations: Abdomen is soft.     Tenderness: There is no abdominal tenderness. There is no guarding.     Comments: Well-healed midline scar noted  Musculoskeletal:        General: Normal range of motion.     Comments: +2 pedis pulse palpated left foot.  Right foot is cooler to touch with diminished pulse.  Skin:    General: Skin is warm.     Coloration: Skin is not jaundiced.     Comments: No obvious wound or ulcer at bilateral feet  Neurological:     Mental Status: He is alert and oriented to person, place, and time.  Psychiatric:        Mood and Affect: Mood normal.      EKG: personally reviewed my interpretation is normal sinus rhythm.  CXR: personally reviewed my interpretation is unremarkable  Assessment & Plan by  Problem: Principal Problem:   NSTEMI (non-ST elevated myocardial infarction) ([place]) Active Problems:   Depressive disorder   Type 2 diabetes mellitus with diabetic nephropathy, without long-term current use of insulin (HCC)   Essential hypertension   Chronic kidney disease (CKD), stage III (moderate) ([place])  [name] is a 66 year old male with past medical history of hypertension, type 2 diabetes, CKD 3A, hyperlipidemia, prostate cancer and depression who  presented for chest pain radiates to left jaw accompanied diaphoresis, and was admitted for NSTEMI.  NSTEMI Trop: 568 - 3473.  No ST elevation on EKG Chest pain has improved on nitro drip.  Risk factor including uncontrolled hypertension and type 2 diabetes from medication nonadherence. -Appreciate cardiology recommendation -Plan for left heart cath tomorrow.  N.p.o. after midnight -Loaded with aspirin 324 mg.  Start aspirin 81 mg daily -Continue heparin drip -Continue nitro drip -Continue metoprolol -Continue Lipitor 80 mg.  Check lipid panel -Pending echocardiogram -Continue trending troponin  Hypertension Cardiology started amlodipine and metoprolol -Continue nitro drip  Type 2 diabetes A1C of 11 in June.  Unclear if patient has DKA in the past. Home regimen: Semglee 20 units nightly Novolog 4 units TID w meal Rybelsus 14 mg daily  -Patient will require more assistance with medication management after discharge -Start Semglee 10 units nightly due to n.p.o. status tomorrow -Sliding scale insulin -No metformin due to CKD.   CKD 3A In the setting of uncontrolled diabetes and hypertension.  Kidney function at baseline. -Gentle hydration with NS 75 cc/h for the heart cath tomorrow -BMP in a.m.  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  No work-up was done in the past.  Patient has no history of smoking. -Will check peripheral smear  and serum erythropoietin to determine primary or secondary etiology  Right lower extremity diminished pulses -Obtain ABI  Prostate cancer Patient denies surgical intervention or radiation/chemotherapy -Resume finasteride  Depression Can resume Zoloft after the heart cath tomorrow  Full code Diet: Heart healthy IV: NS 75 cc/hour DVT: Heparin drip  Dispo: Admit patient to Inpatient with expected length of stay greater than 2 midnights.  Signed: Gaylan Gerold, DO 10/29/2021, 12:39 PM  Pager: [phone] After 5pm on weekdays and 1pm on weekends: On Call pager: [phone]

## 2021-10-29 NOTE — H&P (View-Only) (Signed)
Cardiology Consultation:   Patient ID: [name] MRN: [id number]; DOB: November 05, 1955  Admit date: 10/29/2021 Date of Consult: 10/29/2021  PCP:  Merryl Hacker No   [facility] Providers Cardiologist:  Larae Grooms, MD   Patient Profile:   [name] is a 66 y.o. male with a hx of medical noncompliance, hypertension, depression, anxiety, anemia, postpolio syndrome, DM, CKD stage III, hyperlipidemia, prostate cancer, and GERD who is being seen 10/29/2021 for the evaluation of NSTEMI at the request of Dr. Dayna Barker.  History of Present Illness:   [name] was seen during hospitalization in October 2022 with chest pain.  EKG was nonischemic and HST was negative x3.  Given symptoms and risk factors, he underwent nuclear stress test 01/07/2021 that showed no reversible ischemia.  Echocardiogram at that time showed LVEF 60 to 15%, grade 1 diastolic dysfunction, and no significant valvular disease.  Aortic valve sclerosis is noted without evidence of aortic stenosis.  He was hospitalized 08/2021 for dizziness.  No arrhythmia or heart block noted on telemetry.  Minimal carotid artery disease on ultrasound.  Dizziness felt not cardiac related.  Patient did have dizziness while on telemetry with no concomitant arrhythmia.  He did not follow-up with cardiology after either hospitalization.  He has had multiple ER visits (18 since Jan 2023) and has a history of leaving AMA.   He presented to [facility] ED with chest pain, abdominal pain, and nausea vomiting with diarrhea x2.  CBG was 403 on arrival and BP was 180/90.  He reported sitting in the sun all day but was unable to keep food down due to emesis.  HST 568 --> 3473 EKG sCR 1.79, near baseline WBC 13.3 Hb 19.9  Cardiology was consulted. History was a bit difficult, pt difficult historian. He lives at home alone. He reports compliance on benicar. He was walking to the store yesterday at 3pm and had to sit on the curb due to weakness and fatigue. He finally  made it home to rest and developed chest pressure, intermittent SOB, diaphoresis and N/V. No diarrhea. CP rated as a pressure, band around his chest, has been intermittent, rated 8/10. He is currently comfortable. He has received nitro x 2 + 324 mg ASA and heparin gtt.   With further questioning, he states he has been getting chest pain with walking, suspicious for angina.    Past Medical History:  Diagnosis Date   Acute kidney injury superimposed on chronic kidney disease ([place]) 02/10/2020   Acute on chronic kidney failure (HCC) 09/28/2019   Closed nondisplaced spiral fracture of shaft of right tibia    Community acquired pneumonia 11/19/5857   Complicated urinary tract infection 09/06/2016   Depression    Diabetes mellitus without complication (HCC)    GERD (gastroesophageal reflux disease)    Gout    right foot   Hemorrhoids    Hypertension    Hypertension    Macular degeneration    Microcytic anemia    PNA (pneumonia) 03/26/2021   Prostate cancer ([place]) 08/2009   Rectal bleeding 07/31/2016   Tubular adenoma     Past Surgical History:  Procedure Laterality Date   CYSTOSCOPY WITH RETROGRADE PYELOGRAM, URETEROSCOPY AND STENT PLACEMENT Right 07/31/2016   Procedure: CYSTOSCOPY WITH RIGHT  RETROGRADE PYELOGRAM, URETEROSCOPY;  Surgeon: [name], MD;  Location: [facility];  Service: Urology;  Laterality: Right;   INGUINAL HERNIA REPAIR     Left ankle joint fusion  1981   PROSTATE BIOPSY     x  2   URETERAL REIMPLANTION  07/31/2016   Procedure: URETERAL REIMPLANT, right boari flap, right psoas hitch;  Surgeon: [name], MD;  Location: [facility];  Service: Urology;;     Home Medications:  Prior to Admission medications   Medication Sig Start Date End Date Taking? Authorizing Provider  aspirin EC 81 MG tablet Take 81 mg by mouth daily with breakfast.     [provider]  atorvastatin (LIPITOR) 40 MG tablet Take 1 tablet (40 mg total) by mouth daily. 09/07/21   Hosie Poisson, MD  finasteride (PROSCAR) 5 MG tablet Take 1 tablet (5 mg total) by mouth daily. 09/07/21   Hosie Poisson, MD  hydrOXYzine (ATARAX) 25 MG tablet Take 1 tablet (25 mg total) by mouth 3 (three) times daily as needed for anxiety. 09/11/21   [place] Eric J, DO  insulin aspart (NOVOLOG) 100 UNIT/ML injection Inject 4 Units into the skin 3 (three) times daily with meals. 09/11/21   [place] Eric J, DO  insulin glargine-yfgn (SEMGLEE) 100 UNIT/ML injection Inject 0.2 mLs (20 Units total) into the skin daily. 09/12/21   [place] Donnamarie Poag, DO  meclizine (ANTIVERT) 12.5 MG tablet Take 1 tablet (12.5 mg total) by mouth 2 (two) times daily as needed for dizziness. 09/06/21   Hosie Poisson, MD  methylPREDNISolone (MEDROL DOSEPAK) 4 MG TBPK tablet Day 1: Take 2 tablets for breakfast, 1 tab after lunch and after supper, 2 tabs at bedtime.  Day 2: Take 1 tab before breakfast, 1 tablet after lunch and after supper, 2 tablets at bedtime.  Day 3: Take 1 tablet before breakfast, 1 tablet for lunch, 1 tablet after supper, 1 tablet at bedtime.  Day 4: Take 1 tablet before breakfast, 1 tablet after lunch, 1 tablet at bedtime.  Day 5: Take 1 tab before breakfast and at bedtime.  Day 6: Take 1 tablet before breakfast 10/20/21   Azucena Cecil, PA-C  Multiple Vitamin (MULTIVITAMIN WITH MINERALS) TABS tablet Take 1 tablet by mouth daily.    [provider]  Semaglutide (RYBELSUS) 14 MG TABS Take 1 tablet by mouth daily. Take 30 minutes before breakfast 12/06/20   [name], [name]  sertraline (ZOLOFT) 50 MG tablet Take 1 tablet (50 mg total) by mouth daily. 09/12/21   [place] Donnamarie Poag, DO  Tetrahydrozoline HCl (VISINE OP) Place 1 drop into both eyes daily as needed (itching/irritation).    [provider]  traZODone (DESYREL) 50 MG tablet Take 1 tablet (50 mg total) by mouth at bedtime as needed for sleep. 03/30/21   Jonetta Osgood, MD    Inpatient Medications: Scheduled Meds:  Continuous Infusions:   heparin 1,000 Units/hr (10/29/21 0849)   PRN Meds: nitroGLYCERIN  Allergies:    Allergies  Allergen Reactions   Haldol [Haloperidol] Other (See Comments)    Per family cause life threatening complications to patient Per Pt, it causes "complications with my mouth"    Social History:   Social History   Socioeconomic History   Marital status: Divorced    Spouse name: Not on file   Number of children: 0   Years of education: Not on file   Highest education level: Not on file  Occupational History   Occupation: retired  Tobacco Use   Smoking status: Never   Smokeless tobacco: Never  Vaping Use   Vaping Use: Never used  Substance and Sexual Activity   Alcohol use: Not Currently   Drug use: No   Sexual activity: Yes  Other Topics Concern   Not on file  Social History Narrative   Not on file   Social Determinants of Health   Financial Resource Strain: Low Risk  (12/10/2019)   Overall Financial Resource Strain (CARDIA)    Difficulty of Paying Living Expenses: Not hard at all  Food Insecurity: No Food Insecurity (08/26/2019)   Hunger Vital Sign    Worried About Running Out of Food in the Last Year: Never true    Ran Out of Food in the Last Year: Never true  Transportation Needs: No Transportation Needs (08/26/2019)   PRAPARE - Hydrologist (Medical): No    Lack of Transportation (Non-Medical): No  Physical Activity: Insufficiently Active (08/26/2019)   Exercise Vital Sign    Days of Exercise per Week: 3 days    Minutes of Exercise per Session: 10 min  Stress: No Stress Concern Present (08/26/2019)   [place]    Feeling of Stress : Only a little  Social Connections: Not on file  Intimate Partner Violence: Not At Risk (08/25/2018)   Humiliation, Afraid, Rape, and Kick questionnaire    Fear of Current or Ex-Partner: No    Emotionally Abused: No    Physically Abused: No     Sexually Abused: No    Family History:    Family History  Problem Relation Age of Onset   Lung cancer Mother        Deceased   Pancreatic cancer Father    Heart disease Father        Deceased   Throat cancer Brother    Lung cancer Maternal Uncle        nephew   Diabetes Other      ROS:  Please see the history of present illness.   All other ROS reviewed and negative.     Physical Exam/Data:   Vitals:   10/29/21 0800 10/29/21 0815 10/29/21 0830 10/29/21 0845  BP: (!) 160/96 (!) 149/98 (!) 154/94 (!) 176/93  Pulse: 64 74 63 66  Resp: '14 15 16 16  '$ Temp:      TempSrc:      SpO2: 98% 98% 97% 99%  Weight:      Height:       No intake or output data in the 24 hours ending 10/29/21 0930    10/29/2021    1:49 AM 10/20/2021   12:04 PM 09/24/2021    3:39 PM  Last 3 Weights  Weight (lbs) 164 lb 14.5 oz 165 lb 175 lb  Weight (kg) 74.8 kg 74.844 kg 79.379 kg     Body mass index is 26.62 kg/m.  General:  Well nourished, well developed, in no acute distress HEENT: normal Neck: no JVD Vascular: No carotid bruits; Distal pulses 2+ bilaterally Cardiac:  normal S1, S2; RRR; no murmur  Lungs:  clear to auscultation bilaterally, no wheezing, rhonchi or rales  Abd: soft, nontender, no hepatomegaly  Ext: no edema Musculoskeletal:  No deformities, BUE and BLE strength normal and equal Skin: warm and dry  Neuro:  CNs 2-12 intact, no focal abnormalities noted Psych:  Normal affect   EKG:  The EKG was personally reviewed and demonstrates:  sinus rhythm with HR 61 Telemetry:  Telemetry was personally reviewed and demonstrates:  sinus rhythm HR 60-70s, PVCs  Relevant CV Studies:  Echo 09/04/21:  1. Left ventricular ejection fraction, by estimation, is 60 to 65%. The  left ventricle has normal  function. The left ventricle has no regional  wall motion abnormalities. There is mild concentric left ventricular  hypertrophy. Left ventricular diastolic  parameters are consistent with  Grade I diastolic dysfunction (impaired  relaxation).   2. Right ventricular systolic function is normal. The right ventricular  size is normal.   3. The mitral valve is abnormal. No evidence of mitral valve  regurgitation. No evidence of mitral stenosis.   4. The aortic valve was not well visualized. Aortic valve regurgitation  is not visualized. Aortic valve sclerosis is present, with no evidence of  aortic valve stenosis.   5. The inferior vena cava is normal in size with greater than 50%  respiratory variability, suggesting right atrial pressure of 3 mmHg.    Nuclear stress test 12/2020 IMPRESSION: 1. No reversible ischemia or infarction.   2. Normal left ventricular wall motion.   3. Left ventricular ejection fraction 58%   4. Non invasive risk stratification*: Low   Laboratory Data:  High Sensitivity Troponin:   Recent Labs  Lab 10/29/21 0410 10/29/21 0611  TROPONINIHS 568* 3,473*     Chemistry Recent Labs  Lab 10/29/21 0410  NA 135  K 4.5  CL 97*  CO2 26  GLUCOSE 352*  BUN 54*  CREATININE 1.79*  CALCIUM 10.2  GFRNONAA 41*  ANIONGAP 12    Recent Labs  Lab 10/29/21 0410  PROT 7.9  ALBUMIN 4.2  AST 23  ALT 30  ALKPHOS 133*  BILITOT 1.1   Lipids No results for input(s): "CHOL", "TRIG", "HDL", "LABVLDL", "LDLCALC", "CHOLHDL" in the last 168 hours.  Hematology Recent Labs  Lab 10/29/21 0410  WBC 13.3*  RBC 6.77*  HGB 19.9*  HCT 57.4*  MCV 84.8  MCH 29.4  MCHC 34.7  RDW 13.3  PLT 218   Thyroid No results for input(s): "TSH", "FREET4" in the last 168 hours.  BNPNo results for input(s): "BNP", "PROBNP" in the last 168 hours.  DDimer No results for input(s): "DDIMER" in the last 168 hours.   Radiology/Studies:  DG Chest 2 View  Result Date: 10/29/2021 CLINICAL DATA:  Chest pain. EXAM: CHEST - 2 VIEW COMPARISON:  September 24, 2021 FINDINGS: The heart size and mediastinal contours are within normal limits. Both lungs are clear. The visualized  skeletal structures are unremarkable. IMPRESSION: No active cardiopulmonary disease. Electronically Signed   By: Virgina Norfolk M.D.   On: 10/29/2021 02:59     Assessment and Plan:   NSTEMI Chest pain - HS troponin 568 --> 3473 - EKG does not appear ischemic - pt reports symptoms concerning for angina - pt continues to have intermittent CP on heparin - discussed possible heart catheterization and kidney injury with contrast - continue ASA, heparin, statin - would gently hydrate today, I have placed heart cath orders for tomorrow with hydration starting at 8PM tonight   Hypertension He reports bein gon benicar, this is not listed on home medications - will start amlodipine, hold ARB/ACEI for now   Hyperlipidemia with LDL goal < 70 May consider LDL goal lower than 55 given uncontrolled DM 12/06/2020: Cholesterol, Total 192; HDL 39; LDL Chol Calc (NIH) 115; Triglycerides 218 Increase to 80 mg lipitor   CKD stage III - sCr 1.79 - baseline apepars 1.5-1.7 - gentle hydration   DM A1c 11% UA with protein and glucose Appreciate assistance with DM medications He tells me he isn't taking insulin, listed on home meds   Leukocytosis WBC 13.3 UA does not appear infectious   Hx  of noncompliance Was just discharged from SNF due to behavioral outburtsts. Has a history of refusing vitals and telemetry. Seems to be cooperative currently. He reports compliance with his medications, although does miss some doses.    Risk Assessment/Risk Scores:     TIMI Risk Score for Unstable Angina or Non-ST Elevation MI:   The patient's TIMI risk score is 5, which indicates a 26% risk of all cause mortality, new or recurrent myocardial infarction or need for urgent revascularization in the next 14 days.     For questions or updates, please contact [place] Please consult [email or web address] for contact info under    Signed, Ledora Bottcher, PA  10/29/2021 9:30 AM  History and all data  above reviewed.  Patient examined.  The patient presents with chest discomfort.  This was left-sided and into his left arm and up to his jaw.  It has been severe.  Happened at rest essentially after he went to get something to eat yesterday.  His friend noticed that he was pale and went back to his apartment and was extremely fatigued with this discomfort.  He presented to the emergency room.  He was hyperglycemic and hypertensive.  Cardiac enzymes have been elevated as above.  EKG did not demonstrate any acute ST T wave changes.  He has a poor social situation and has only a sister who might check on him.  He gets a monthly check and has a one bedroom apartment but reports that he is not able to take care of himself and that he does not take his meds.   I agree with the findings as above.  The patient exam reveals COR:RRR  ,  Lungs: Clear  ,  Abd: Positive bowel sounds, no rebound no guarding, Ext No edema  .  All available labs, radiology testing, previous records reviewed. Agree with documented assessment and plan. NSTEMI:  I have discussed with him the need for cardiac cath.  I am going to try to hydrate him today and cath electively tomorrow given his CKD.  The patient understands that risks included but are not limited to stroke (1 in 1000), death (1 in 56), kidney failure [usually temporary] (1 in 500), bleeding (1 in 200), allergic reaction [possibly serious] (1 in 200).  The patient understands and agrees to proceed.   He understands that the risk of renal insufficiency is higher.   He is currently having 7 out of 10 chest discomfort so we will start IV nitroglycerin.   HTN: For now I am going to start a beta-blocker.  I will hold off on a ARB until after his cardiac catheterization.   We will however need med titration.  Minus Breeding  10:51 AM  10/29/2021

## 2021-10-29 NOTE — Progress Notes (Signed)
[place] for Heparin  Indication: chest pain/ACS  Allergies  Allergen Reactions   Haldol [Haloperidol] Other (See Comments)    Per family cause life threatening complications to patient Per Pt, it causes "complications with my mouth"    Patient Measurements: Height: '5\' 6"'$  (167.6 cm) Weight: 74.8 kg (164 lb 14.5 oz) IBW/kg (Calculated) : 63.8  Vital Signs: Temp: 98.6 F (37 C) (08/14 1315) Temp Source: Oral (08/14 1315) BP: 116/80 (08/14 1522) Pulse Rate: 58 (08/14 1522)  Labs: Recent Labs    10/29/21 0410 10/29/21 0611 10/29/21 1424 10/29/21 1503  HGB 19.9*  --   --   --   HCT 57.4*  --   --   --   PLT 218  --   --   --   HEPARINUNFRC  --   --   --  0.40  CREATININE 1.79*  --   --   --   CKTOTAL 132  --   --   --   TROPONINIHS 568* 3,473* 9,554*  --      Estimated Creatinine Clearance: 36.1 mL/min (A) (by C-G formula based on SCr of 1.79 mg/dL (H)).   Medical History: Past Medical History:  Diagnosis Date   Acute kidney injury superimposed on chronic kidney disease ([place]) 02/10/2020   Acute on chronic kidney failure (HCC) 09/28/2019   Closed nondisplaced spiral fracture of shaft of right tibia    Community acquired pneumonia 61/44/3154   Complicated urinary tract infection 09/06/2016   Depression    Diabetes mellitus without complication (HCC)    GERD (gastroesophageal reflux disease)    Gout    right foot   Hemorrhoids    Hypertension    Macular degeneration    Microcytic anemia    PNA (pneumonia) 03/26/2021   Prostate cancer ([place]) 08/2009   Tubular adenoma     Assessment: 66 y/o M with central chest pain and elevated troponin. Starting heparin. Labs above reviewed. PTA meds reviewed.  Heparin level therapeutic on 1000 units/hr  Goal of Therapy:  Heparin level 0.3-0.7 units/ml Monitor platelets by anticoagulation protocol: Yes   Plan:  Continue heparin gtt at 1000 units/hr Daily heparin level, CBC, s/s  bleeding F/u cath plans  Bertis Ruddy, PharmD Clinical Pharmacist ED Pharmacist Phone # [phone] 10/29/2021 3:39 PM

## 2021-10-29 NOTE — ED Provider Notes (Signed)
[facility] EMERGENCY DEPARTMENT Provider Note   CSN: [id number] Arrival date & time: 10/29/21  0145     History  Chief Complaint  Patient presents with   Chest Pain    [name] is a 66 y.o. male with a hx of diabetes mellitus, hypertension, CKD, hyperlipidemia, and anemia who presents to the ED with complaints of chest pain for the past 3 days. Patient reports pain is to the left chest, dull/tight, radiates to the jaw at times, waxes/wanes in severity. Aggravated by activity, alleviated by laying down. Associated dyspnea at times. Has also had nausea with emesis, not able to keep anything down, and a few episodes of diarrhea. Reports he feels generally not well & weak. Denies fever, cough, hemoptysis, leg pain/swelling, or syncope.   HPI     Home Medications Prior to Admission medications   Medication Sig Start Date End Date Taking? Authorizing Provider  aspirin EC 81 MG tablet Take 81 mg by mouth daily with breakfast.     [provider]  atorvastatin (LIPITOR) 40 MG tablet Take 1 tablet (40 mg total) by mouth daily. 09/07/21   Hosie Poisson, MD  finasteride (PROSCAR) 5 MG tablet Take 1 tablet (5 mg total) by mouth daily. 09/07/21   Hosie Poisson, MD  hydrOXYzine (ATARAX) 25 MG tablet Take 1 tablet (25 mg total) by mouth 3 (three) times daily as needed for anxiety. 09/11/21   [place] Eric J, DO  insulin aspart (NOVOLOG) 100 UNIT/ML injection Inject 4 Units into the skin 3 (three) times daily with meals. 09/11/21   [place] Eric J, DO  insulin glargine-yfgn (SEMGLEE) 100 UNIT/ML injection Inject 0.2 mLs (20 Units total) into the skin daily. 09/12/21   [place] Donnamarie Poag, DO  meclizine (ANTIVERT) 12.5 MG tablet Take 1 tablet (12.5 mg total) by mouth 2 (two) times daily as needed for dizziness. 09/06/21   Hosie Poisson, MD  methylPREDNISolone (MEDROL DOSEPAK) 4 MG TBPK tablet Day 1: Take 2 tablets for breakfast, 1 tab after lunch and after supper, 2 tabs at  bedtime.  Day 2: Take 1 tab before breakfast, 1 tablet after lunch and after supper, 2 tablets at bedtime.  Day 3: Take 1 tablet before breakfast, 1 tablet for lunch, 1 tablet after supper, 1 tablet at bedtime.  Day 4: Take 1 tablet before breakfast, 1 tablet after lunch, 1 tablet at bedtime.  Day 5: Take 1 tab before breakfast and at bedtime.  Day 6: Take 1 tablet before breakfast 10/20/21   Azucena Cecil, PA-C  Multiple Vitamin (MULTIVITAMIN WITH MINERALS) TABS tablet Take 1 tablet by mouth daily.    [provider]  Semaglutide (RYBELSUS) 14 MG TABS Take 1 tablet by mouth daily. Take 30 minutes before breakfast 12/06/20   [name], [name]  sertraline (ZOLOFT) 50 MG tablet Take 1 tablet (50 mg total) by mouth daily. 09/12/21   [place] Donnamarie Poag, DO  Tetrahydrozoline HCl (VISINE OP) Place 1 drop into both eyes daily as needed (itching/irritation).    [provider]  traZODone (DESYREL) 50 MG tablet Take 1 tablet (50 mg total) by mouth at bedtime as needed for sleep. 03/30/21   Ghimire, Henreitta Leber, MD      Allergies    Haldol [haloperidol]    Review of Systems   Review of Systems  Constitutional:  Positive for fatigue. Negative for chills and fever.  Respiratory:  Positive for shortness of breath.   Cardiovascular:  Positive for chest pain. Negative  for leg swelling.  Gastrointestinal:  Positive for diarrhea, nausea and vomiting. Negative for blood in stool.  Genitourinary:  Negative for dysuria.  Neurological:  Negative for syncope.  All other systems reviewed and are negative.   Physical Exam Updated Vital Signs BP (!) 212/91 (BP Location: Left Arm)   Pulse 83   Temp 97.9 F (36.6 C)   Resp 17   Ht '5\' 6"'$  (1.676 m)   Wt 74.8 kg   SpO2 98%   BMI 26.62 kg/m  Physical Exam Vitals and nursing note reviewed.  Constitutional:      General: He is not in acute distress.    Appearance: He is well-developed. He is not toxic-appearing.  HENT:     Head: Normocephalic  and atraumatic.  Eyes:     General:        Right eye: No discharge.        Left eye: No discharge.     Conjunctiva/sclera: Conjunctivae normal.  Cardiovascular:     Rate and Rhythm: Normal rate and regular rhythm.     Pulses:          Radial pulses are 2+ on the right side and 2+ on the left side.  Pulmonary:     Effort: No respiratory distress.     Breath sounds: Normal breath sounds. No wheezing or rales.  Chest:     Chest wall: Tenderness (anterior chest wall) present.  Abdominal:     General: A surgical scar is present. There is no distension.     Palpations: Abdomen is soft.     Tenderness: There is no abdominal tenderness. There is no guarding or rebound.  Musculoskeletal:     Cervical back: Neck supple.     Right lower leg: No tenderness. No edema.     Left lower leg: No tenderness. No edema.  Skin:    General: Skin is warm and dry.  Neurological:     Mental Status: He is alert.     Comments: Clear speech.   Psychiatric:        Behavior: Behavior normal.     ED Results / Procedures / Treatments   Labs (all labs ordered are listed, but only abnormal results are displayed) Labs Reviewed  CBC WITH DIFFERENTIAL/PLATELET - Abnormal; Notable for the following components:      Result Value   WBC 13.3 (*)    RBC 6.77 (*)    Hemoglobin 19.9 (*)    HCT 57.4 (*)    Neutro Abs 10.7 (*)    Abs Immature Granulocytes 0.19 (*)    All other components within normal limits  COMPREHENSIVE METABOLIC PANEL - Abnormal; Notable for the following components:   Chloride 97 (*)    Glucose, Bld 352 (*)    BUN 54 (*)    Creatinine, Ser 1.79 (*)    Alkaline Phosphatase 133 (*)    GFR, Estimated 41 (*)    All other components within normal limits  TROPONIN I (HIGH SENSITIVITY) - Abnormal; Notable for the following components:   Troponin I (High Sensitivity) 568 (*)    All other components within normal limits  LIPASE, BLOOD  CK  URINALYSIS, ROUTINE W REFLEX MICROSCOPIC  TROPONIN  I (HIGH SENSITIVITY)    EKG EKG Interpretation  Date/Time:  Monday October 29 2021 05:49:06 EDT Ventricular Rate:  68 PR Interval:  124 QRS Duration: 95 QT Interval:  369 QTC Calculation: 393 R Axis:   -25 Text Interpretation: Sinus rhythm Probable left atrial  enlargement Borderline left axis deviation Low voltage, precordial leads Consider anterior infarct Confirmed by Merrily Pew [phone]) on 10/29/2021 6:39:25 AM  Radiology DG Chest 2 View  Result Date: 10/29/2021 CLINICAL DATA:  Chest pain. EXAM: CHEST - 2 VIEW COMPARISON:  September 24, 2021 FINDINGS: The heart size and mediastinal contours are within normal limits. Both lungs are clear. The visualized skeletal structures are unremarkable. IMPRESSION: No active cardiopulmonary disease. Electronically Signed   By: Virgina Norfolk M.D.   On: 10/29/2021 02:59    Procedures .Critical Care  Performed by: Amaryllis Dyke, PA-C Authorized by: Amaryllis Dyke, PA-C     CRITICAL CARE Performed by: Kennith Maes   Total critical care time: 30 minutes  Critical care time was exclusive of separately billable procedures and treating other patients.  Critical care was necessary to treat or prevent imminent or life-threatening deterioration.  Critical care was time spent personally by me on the following activities: development of treatment plan with patient and/or surrogate as well as nursing, discussions with consultants, evaluation of patient's response to treatment, examination of patient, obtaining history from patient or surrogate, ordering and performing treatments and interventions, ordering and review of laboratory studies, ordering and review of radiographic studies, pulse oximetry and re-evaluation of patient's condition.    Medications Ordered in ED Medications - No data to display  ED Course/ Medical Decision Making/ A&P                           Medical Decision Making Amount and/or Complexity of Data  Reviewed Labs: ordered.  Risk OTC drugs. Prescription drug management. Decision regarding hospitalization.   Patient presents to the ED with complaints of chest pain, this involves an extensive number of treatment options, and is a complaint that carries with it a high risk of complications and morbidity. Nontoxic, vitals w/ elevated blood pressure.   DDX including but not limited to: ACS, pulmonary embolism, dissection, pneumothorax, pneumonia, arrhythmia, severe anemia, MSK, GERD, anxiety, abdominal process, DKA, vital illness.   Additional history obtained:  Echocardiogram 08/2021 viewed:  1. Left ventricular ejection fraction, by estimation, is 60 to 65%. The  left ventricle has normal function. The left ventricle has no regional  wall motion abnormalities. There is mild concentric left ventricular  hypertrophy. Left ventricular diastolic  parameters are consistent with Grade I diastolic dysfunction (impaired  relaxation).   2. Right ventricular systolic function is normal. The right ventricular  size is normal.   3. The mitral valve is abnormal. No evidence of mitral valve  regurgitation. No evidence of mitral stenosis.   4. The aortic valve was not well visualized. Aortic valve regurgitation  is not visualized. Aortic valve sclerosis is present, with no evidence of  aortic valve stenosis.   5. The inferior vena cava is normal in size with greater than 50%  respiratory variability, suggesting right atrial pressure of 3 mmHg.  EKG: Sinus rhythm Probable left atrial enlargement Borderline left axis deviation Low voltage, precordial leads Consider anterior infarct   Lab Tests:  I viewed & interpreted labs including:  CBC: leukocytosis, elevated hgb/hct increased from prior.  CMP: BUN up.  Lipase: WNL CK: WNL Troponin: elevated @ 568- increased from prior typical normals for patient.   Imaging Studies:  I iewed the following imaging, agree with radiologist impression:  No  active cardiopulmonary disease.  ED Course:  I ordered medications including aspirin, heparin, and nitroglycerin due to concern for ACS, zofran  ordered for nausea. Plan for admission.   05:55: CONSULT: Discussed with Dr. Humphrey Rolls with cardiology- relays cardiology team will see patient in the ED and plan to admit.   Portions of this note were generated with Lobbyist. Dictation errors may occur despite best attempts at proofreading.   Final Clinical Impression(s) / ED Diagnoses Final diagnoses:  NSTEMI (non-ST elevated myocardial infarction) ([place])  Hyperglycemia  Chronic kidney disease, unspecified CKD stage    Rx / DC Orders ED Discharge Orders     None         Amaryllis Dyke, PA-C 10/29/21 0657    Merrily Pew, MD 10/29/21 [phone]

## 2021-10-30 ENCOUNTER — Encounter (HOSPITAL_COMMUNITY): Payer: Self-pay | Admitting: Cardiology

## 2021-10-30 ENCOUNTER — Other Ambulatory Visit (HOSPITAL_COMMUNITY): Payer: Self-pay

## 2021-10-30 ENCOUNTER — Inpatient Hospital Stay (HOSPITAL_COMMUNITY): Payer: Medicare Other

## 2021-10-30 ENCOUNTER — Telehealth (HOSPITAL_COMMUNITY): Payer: Self-pay | Admitting: Pharmacy Technician

## 2021-10-30 DIAGNOSIS — I214 Non-ST elevation (NSTEMI) myocardial infarction: Secondary | ICD-10-CM | POA: Diagnosis not present

## 2021-10-30 DIAGNOSIS — Z8546 Personal history of malignant neoplasm of prostate: Secondary | ICD-10-CM

## 2021-10-30 DIAGNOSIS — I739 Peripheral vascular disease, unspecified: Secondary | ICD-10-CM

## 2021-10-30 DIAGNOSIS — I129 Hypertensive chronic kidney disease with stage 1 through stage 4 chronic kidney disease, or unspecified chronic kidney disease: Secondary | ICD-10-CM

## 2021-10-30 DIAGNOSIS — E785 Hyperlipidemia, unspecified: Secondary | ICD-10-CM

## 2021-10-30 DIAGNOSIS — F32A Depression, unspecified: Secondary | ICD-10-CM

## 2021-10-30 DIAGNOSIS — F419 Anxiety disorder, unspecified: Secondary | ICD-10-CM

## 2021-10-30 DIAGNOSIS — Z794 Long term (current) use of insulin: Secondary | ICD-10-CM

## 2021-10-30 DIAGNOSIS — E1121 Type 2 diabetes mellitus with diabetic nephropathy: Secondary | ICD-10-CM

## 2021-10-30 DIAGNOSIS — N1831 Chronic kidney disease, stage 3a: Secondary | ICD-10-CM

## 2021-10-30 DIAGNOSIS — E1122 Type 2 diabetes mellitus with diabetic chronic kidney disease: Secondary | ICD-10-CM

## 2021-10-30 LAB — GLUCOSE, CAPILLARY
Glucose-Capillary: 271 mg/dL — ABNORMAL HIGH (ref 70–99)
Glucose-Capillary: 236 mg/dL — ABNORMAL HIGH (ref 70–99)
Glucose-Capillary: 237 mg/dL — ABNORMAL HIGH (ref 70–99)
Glucose-Capillary: 248 mg/dL — ABNORMAL HIGH (ref 70–99)

## 2021-10-30 LAB — ECHOCARDIOGRAM COMPLETE
Area-P 1/2: 2.21 cm2
Height: 66 in
MV VTI: 2.39 cm2
S' Lateral: 2.3 cm
Weight: 2610.25 oz

## 2021-10-30 LAB — CBC
HCT: 52.8 % — ABNORMAL HIGH (ref 39.0–52.0)
Hemoglobin: 17.9 g/dL — ABNORMAL HIGH (ref 13.0–17.0)
MCH: 29.2 pg (ref 26.0–34.0)
MCHC: 33.9 g/dL (ref 30.0–36.0)
MCV: 86.1 fL (ref 80.0–100.0)
Platelets: 209 10*3/uL (ref 150–400)
RBC: 6.13 MIL/uL — ABNORMAL HIGH (ref 4.22–5.81)
RDW: 13.2 % (ref 11.5–15.5)
WBC: 8.7 10*3/uL (ref 4.0–10.5)
nRBC: 0 % (ref 0.0–0.2)

## 2021-10-30 LAB — BASIC METABOLIC PANEL
Anion gap: 9 (ref 5–15)
BUN: 41 mg/dL — ABNORMAL HIGH (ref 8–23)
CO2: 25 mmol/L (ref 22–32)
Calcium: 9.7 mg/dL (ref 8.9–10.3)
Chloride: 105 mmol/L (ref 98–111)
Creatinine, Ser: 1.5 mg/dL — ABNORMAL HIGH (ref 0.61–1.24)
GFR, Estimated: 51 mL/min — ABNORMAL LOW (ref >60)
Glucose, Bld: 172 mg/dL — ABNORMAL HIGH (ref 70–99)
Potassium: 4.4 mmol/L (ref 3.5–5.1)
Sodium: 139 mmol/L (ref 135–145)

## 2021-10-30 LAB — ERYTHROPOIETIN: Erythropoietin: 6.7 m[IU]/mL (ref 2.6–18.5)

## 2021-10-30 MED ORDER — INSULIN GLARGINE-YFGN 100 UNIT/ML ~~LOC~~ SOLN
15.0000 [IU] | Freq: Every day | SUBCUTANEOUS | Status: DC
  Administered 2021-10-30: 15 [IU] via SUBCUTANEOUS
  Filled 2021-10-30 (×2): qty 0.15

## 2021-10-30 MED ORDER — PERFLUTREN LIPID MICROSPHERE
1.0000 mL | INTRAVENOUS | Status: AC | PRN
  Administered 2021-10-30: 2 mL via INTRAVENOUS

## 2021-10-30 NOTE — Inpatient Diabetes Management (Addendum)
Inpatient Diabetes Program Recommendations  AACE/ADA: New Consensus Statement on Inpatient Glycemic Control (2015)  Target Ranges:  Prepandial:   less than 140 mg/dL      Peak postprandial:   less than 180 mg/dL (1-2 hours)      Critically ill patients:  140 - 180 mg/dL   Lab Results  Component Value Date   GLUCAP 236 (H) 10/30/2021   HGBA1C 11.0 (H) 09/04/2021    Review of Glycemic Control  Latest Reference Range & Units 10/29/21 13:42 10/29/21 18:20 10/29/21 21:31 10/30/21 09:42 10/30/21 11:54  Glucose-Capillary 70 - 99 mg/dL 253 (H) 204 (H) 207 (H) 271 (H) 236 (H)  (H): Data is abnormally high  Diabetes history: DM2 Outpatient Diabetes medications:  Semglee 20 units QD Novolog 4 units TID Rybelsus 14 mg QD Current orders for Inpatient glycemic control: Semglee 10 units q hs, Novolog 0-15 units tid, 0-5 units hs  Inpatient Diabetes Program Recommendations:   Please consider: -Increase Semglee to 15 units q hs -Add Novolog 3 units tid meal coverage if eats 50% meals  Spoke with patient @ bedside limited time due to patient states "I don't feel good" and requests to discuss further tomorrow if needed. Patient states "I like ice cream, cakes, and half sweet tea." Patient agrees to decrease amount of sweets and decrease tea to unsweet and add sweetener as needed. Reviewed A1c of 11.0 and risks of elevated CBGs. Requested patient to take log of CBGs or meter to MD appointments. Patient shared he doesn't go to his doctor often, so reminded him importance of keeping appointments and follow up.  Thank you, Nani Gasser. Arieal Cuoco, RN, MSN, CDE  Diabetes Coordinator Inpatient Glycemic Control Team Team Pager [phone] (8am-5pm) 10/30/2021 3:10 PM

## 2021-10-30 NOTE — TOC Initial Note (Addendum)
Transition of Care [facility]) - Initial/Assessment Note    Patient Details  Name: [name] MRN: [id number] Date of Birth: [birth year]  [facility] CM/SW Contact:    Milas Gain, [place] Phone Number: 10/30/2021, 2:17 PM  Clinical Narrative:                  CSW received consult for possible SNF placement at time of discharge. CSW spoke with patient at bedside regarding PT recommendation of SNF placement at time of discharge. Patient reports he comes from home alone.Patient expressed understanding of PT recommendation and is agreeable to SNF placement at time of discharge. Patient gave CSW permission to fax out initial referral near the [facility] and [place] area.CSW discussed insurance authorization process with patient and will provide Medicare SNF ratings list when available with accepted SNF bed offers. Patient reports he has received the COVID vaccines, not boosted. CSW received consult for patient.After speaking with patient,CSW offered patient psychiatry and counseling resources. Patient accepted.All questions answered.No further questions reported at this time. CSW to continue to follow and assist with discharge planning needs.   Update- CSW started insurance authorization for patient Reference # [id number].    Expected Discharge Plan: Skilled Nursing Facility Barriers to Discharge: Continued Medical Work up   Patient Goals and CMS Choice Patient states their goals for this hospitalization and ongoing recovery are:: SNF CMS Medicare.gov Compare Post Acute Care list provided to:: Patient Choice offered to / list presented to : Patient  Expected Discharge Plan and Services Expected Discharge Plan: [place] In-house Referral: Clinical Social Work     Living arrangements for the past 2 months: Apartment                                      Prior Living Arrangements/Services Living arrangements for the past 2 months: Apartment Lives with::  Self Patient language and need for interpreter reviewed:: Yes Do you feel safe going back to the place where you live?: No   SNF  Need for Family Participation in Patient Care: Yes (Comment) Care giver support system in place?: Yes (comment)   Criminal Activity/Legal Involvement Pertinent to Current Situation/Hospitalization: No - Comment as needed  Activities of Daily Living Home Assistive Devices/Equipment: None ADL Screening (condition at time of admission) Patient's cognitive ability adequate to safely complete daily activities?: Yes Is the patient deaf or have difficulty hearing?: No Does the patient have difficulty seeing, even when wearing glasses/contacts?: Yes Does the patient have difficulty concentrating, remembering, or making decisions?: No Patient able to express need for assistance with ADLs?: Yes Does the patient have difficulty dressing or bathing?: Yes  Permission Sought/Granted Permission sought to share information with : Case Manager, Family Supports, Chartered certified accountant granted to share information with : Yes, Verbal Permission Granted  Share Information with NAME: Tim  Permission granted to share info w AGENCY: SNF  Permission granted to share info w Relationship: friend  Permission granted to share info w Contact Information: Octavia Bruckner [phone]  Emotional Assessment Appearance:: Appears stated age Attitude/Demeanor/Rapport: Gracious Affect (typically observed): Calm Orientation: : Oriented to Self, Oriented to  Time, Oriented to Situation Alcohol / Substance Use: Not Applicable Psych Involvement: No (comment)  Admission diagnosis:  Hyperglycemia [R73.9] NSTEMI (non-ST elevated myocardial infarction) ([place]) [I21.4] Chronic kidney disease, unspecified CKD stage [N18.9] Patient Active Problem List   Diagnosis Date Noted   NSTEMI (  non-ST elevated myocardial infarction) ([place]) 10/29/2021   Syncope and collapse 09/02/2021   History of  prostate cancer with recurrent urinary obstruction  03/24/2021   Polycythemia 03/24/2021   COVID-19 virus infection 03/24/2021   History of noncompliance with medical treatment 03/24/2021   HLD (hyperlipidemia) 01/05/2021   Acute urinary retention 10/25/2020   Adjustment disorder with mixed anxiety and depressed mood    Ataxia 08/01/2020   Dizziness 07/31/2020   Hydroureteronephrosis 02/10/2020   Nausea and vomiting 02/10/2020   Pneumonia due to COVID-19 virus 09/29/2019   Hyponatremia 09/28/2019   Sinus tachycardia 09/28/2019   Insomnia 08/26/2018   Nephropathy 03/26/2018   Urinary urgency 03/26/2018   Type 2 diabetes mellitus with diabetic nephropathy, without long-term current use of insulin ([place]) 03/26/2018   Essential hypertension 03/26/2018   Chronic kidney disease (CKD), stage III (moderate) ([place]) 03/26/2018   Conductive hearing loss, bilateral 11/07/2017   Microcytic anemia 02/17/2012   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Phone: [phone]: [phone]    Social Determinants of Health (SDOH) Interventions    Readmission Risk Interventions    10/05/2019    4:42 PM  Readmission Risk Prevention Plan  Transportation Screening Complete  PCP or Specialist Appt within 5-7 Days Complete  Home Care Screening Complete  Medication Review (RN CM) Complete

## 2021-10-30 NOTE — Telephone Encounter (Signed)
Pharmacy Patient Advocate Encounter  Insurance verification completed.    The patient is insured through [employer] Medicare Part D   The patient is currently admitted and ran test claims for the following: Brilinta .  Copays and coinsurance results were relayed to Inpatient clinical team.  

## 2021-10-30 NOTE — Progress Notes (Addendum)
Progress Note  Patient Name: [name] Date of Encounter: 10/30/2021  [facility] Cardiologist: Larae Grooms, MD   Subjective   Patient doing well this AM. Reports some very mild chest pain, pain is significantly improved compared to yesterday. Denies sob.   Patient is worried about where he will go after discharge. He recently lived in a rehab center, but told me that he was asked to leave. However, he would like to return to SNF/rehab after discharge. Social worker consulted   Inpatient Medications    Scheduled Meds:  amLODipine  5 mg Oral Daily   aspirin EC  81 mg Oral Daily   atorvastatin  80 mg Oral Daily   finasteride  5 mg Oral Daily   heparin  5,000 Units Subcutaneous Q8H   insulin aspart  0-15 Units Subcutaneous TID WC   insulin aspart  0-5 Units Subcutaneous QHS   insulin glargine-yfgn  10 Units Subcutaneous QHS   metoprolol tartrate  12.5 mg Oral BID   sodium chloride flush  3 mL Intravenous Q12H   ticagrelor  90 mg Oral BID   Continuous Infusions:  sodium chloride     PRN Meds: sodium chloride, acetaminophen **OR** acetaminophen, sodium chloride flush   Vital Signs    Vitals:   10/29/21 2110 10/29/21 2125 10/30/21 0040 10/30/21 0400  BP: (!) 135/90  126/78 109/80  Pulse: (!) 58  (!) 58 (!) 55  Resp: '17  13 15  '$ Temp: 98.2 F (36.8 C)     TempSrc: Oral     SpO2: 97%  93% 95%  Weight:  74 kg    Height:  '5\' 6"'$  (1.676 m)      Intake/Output Summary (Last 24 hours) at 10/30/2021 0730 Last data filed at 10/29/2021 2050 Gross per 24 hour  Intake --  Output 200 ml  Net -200 ml      10/29/2021    9:25 PM 10/29/2021    1:49 AM 10/20/2021   12:04 PM  Last 3 Weights  Weight (lbs) 163 lb 2.3 oz 164 lb 14.5 oz 165 lb  Weight (kg) 74 kg 74.8 kg 74.844 kg      Telemetry    Sinus rhythm, HR in the 50s  - Personally Reviewed  ECG    Sinus bradycardia, HR 58 BPM  - Personally Reviewed  Physical Exam   GEN: No acute distress. Laying flat in  the bed Neck: No JVD Cardiac: RRR, no murmurs, rubs, or gallops. Right radial cath site covered in bandages. Site is soft and nontender Respiratory: Clear to auscultation bilaterally. GI: Soft, nontender, non-distended  MS: No edema; No deformity. Neuro:  Nonfocal  Psych: Normal affect   Labs    High Sensitivity Troponin:   Recent Labs  Lab 10/29/21 0410 10/29/21 0611 10/29/21 1424 10/29/21 1601  TROPONINIHS 568* 3,473* 9,554* 10,903*     Chemistry Recent Labs  Lab 10/29/21 0410 10/29/21 1601 10/29/21 1613 10/30/21 0333  NA 135 141 138 139  K 4.5 3.5 4.3 4.4  CL 97* 110 100 105  CO2 26 27  --  25  GLUCOSE 352* 191* 208* 172*  BUN 54* 38* 43* 41*  CREATININE 1.79* 1.31* 1.60* 1.50*  CALCIUM 10.2 7.6*  --  9.7  PROT 7.9  --   --   --   ALBUMIN 4.2  --   --   --   AST 23  --   --   --   ALT 30  --   --   --  ALKPHOS 133*  --   --   --   BILITOT 1.1  --   --   --   GFRNONAA 41* 60*  --  51*  ANIONGAP 12 4*  --  9    Lipids  Recent Labs  Lab 10/29/21 1507  CHOL 149  TRIG 137  HDL 28*  LDLCALC 94  CHOLHDL 5.3    Hematology Recent Labs  Lab 10/29/21 0410 10/29/21 1613 10/30/21 0333  WBC 13.3*  --  8.7  RBC 6.77*  --  6.13*  HGB 19.9* 18.0* 17.9*  HCT 57.4* 53.0* 52.8*  MCV 84.8  --  86.1  MCH 29.4  --  29.2  MCHC 34.7  --  33.9  RDW 13.3  --  13.2  PLT 218  --  209   Thyroid No results for input(s): "TSH", "FREET4" in the last 168 hours.  BNPNo results for input(s): "BNP", "PROBNP" in the last 168 hours.  DDimer No results for input(s): "DDIMER" in the last 168 hours.   Radiology    CARDIAC CATHETERIZATION  Result Date: 10/29/2021   Prox RCA lesion is 20% stenosed.   RPDA lesion is 30% stenosed.   1st Mrg lesion is 60% stenosed.   Prox LAD to Mid LAD lesion is 45% stenosed.   1st Diag lesion is 70% stenosed.   Mid LAD lesion is 99% stenosed.   A drug-eluting stent was successfully placed using a SYNERGY XD 2.50X16.   Post intervention, there is  a 0% residual stenosis. 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 Normal LVEDP Successful PCI of the mid LAD with DES x 1 Plan: DAPT for one year. Aggressive risk factor modification. Monitor renal function closely with BMET in am. Hydrate. Possible DC tomorrow if stable.   DG Chest 2 View  Result Date: 10/29/2021 CLINICAL DATA:  Chest pain. EXAM: CHEST - 2 VIEW COMPARISON:  September 24, 2021 FINDINGS: The heart size and mediastinal contours are within normal limits. Both lungs are clear. The visualized skeletal structures are unremarkable. IMPRESSION: No active cardiopulmonary disease. Electronically Signed   By: Virgina Norfolk M.D.   On: 10/29/2021 02:59    Cardiac Studies   LHC 10/29/2021    Prox RCA lesion is 20% stenosed.   RPDA lesion is 30% stenosed.   1st Mrg lesion is 60% stenosed.   Prox LAD to Mid LAD lesion is 45% stenosed.   1st Diag lesion is 70% stenosed.   Mid LAD lesion is 99% stenosed.   A drug-eluting stent was successfully placed using a SYNERGY XD 2.50X16.   Post intervention, there is a 0% residual stenosis.   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  Normal LVEDP Successful PCI of the mid LAD with DES x 1   Plan: DAPT for one year. Aggressive risk factor modification. Monitor renal function closely with BMET in am. Hydrate. Possible DC tomorrow if stable.   Diagnostic Dominance: Right  Intervention    Patient Profile     66 y.o. male with a past medical history of medical noncompliance, HTN, depression, anxiety, anemia, postpolio syndrome, DM, CKD stage III, HLD, prostate cancer, GERD who is being seen for evaluation of NSTEMI   Assessment & Plan    NSTEMI Chest pain - Patient presented complaining of chest pain, abdominal pain, N/V.  - HS troponin [id number]. EKG did not appear ischemic -  He was started on IV heparin, continued to  have some intermittent chest pain - Given his history of CKD, planned to give IV fluids overnight and cath in the morning of 8/15. However, patient continued to have chest pain so underwent LHC on 8/14.  - LHC 8/14 showed signle vessel occlusive CAD with 99% stenosis int he mid LAD. Now S/P DES to LAD  - Echo ordered and scheduled for today  - Patient was given hydration overnight-- renal function stable  - Started metoprolol tartrate 12.5 mg BID yesterday-- BP tolerating well  - continue ASA, start Brilinta  - Continue lipitor 80 mg daily   Hypertension - He reported being benicar, this is not listed on home medications. We will hold off on ACEi/ARB for now due to renal dysfunction  - Continue amlodipine, metoprolol    Hyperlipidemia with LDL goal < 70 - May consider LDL goal lower than 55 given uncontrolled DM - Lipid panel 12/06/2020: Cholesterol, Total 192; HDL 39; LDL Chol Calc (NIH) 115; Triglycerides 218 - Increased lipitor to 80 mg daily-- will need LFTs and lipid panel in 2-3 months   CKD stage III - sCr 1.79 - baseline apepars 1.5-1.7 - gentle hydration after cath-- creatinine 1.50 this AM    DM - A1c 11% - Appreciate primary team's assistance with DM medications   For questions or updates, please contact [facility] Please consult [email or web address] for contact info under        Signed, Margie Billet, PA-C  10/30/2021, 7:30 AM    History and all data above reviewed.  Patient examined.  I agree with the findings as above.  Very mild chest pain.  No SOB. The patient exam reveals COR:RRR  ,  Lungs: Clear  ,  Abd: Positive bowel sounds, no rebound no guarding, Ext No edema  .  All available labs, radiology testing, previous records reviewed. Agree with documented assessment and plan.   NSTEMI:  Doing much better post cath.  Continue with risk reduction.  Echo ordered to see if there has been any change in EF or wall motion abnormality.    Jeneen Rinks Ardelle Haliburton  10:35 AM   10/30/2021

## 2021-10-30 NOTE — NC FL2 (Signed)
[place] LEVEL OF CARE SCREENING TOOL     IDENTIFICATION  Patient Name: [name] Birthdate: 05/01/1955 Sex: male Admission Date (Current Location): 10/29/2021  [facility] and [place] Number:  Herbalist and Address:  [facility], [place] [address] [place], [place] [address]      Provider Number: [id number]  Attending Physician Name and Address:  Angelica Pou, MD  Relative Name and Phone Number:       Current Level of Care: Hospital Recommended Level of Care: [place] Prior Approval Number:    Date Approved/Denied:   PASRR Number: [id number]  Discharge Plan: SNF    Current Diagnoses: Patient Active Problem List   Diagnosis Date Noted   NSTEMI (non-ST elevated myocardial infarction) ([place]) 10/29/2021   Syncope and collapse 09/02/2021   History of prostate cancer with recurrent urinary obstruction  03/24/2021   Polycythemia 03/24/2021   COVID-19 virus infection 03/24/2021   History of noncompliance with medical treatment 03/24/2021   HLD (hyperlipidemia) 01/05/2021   Acute urinary retention 10/25/2020   Adjustment disorder with mixed anxiety and depressed mood    Ataxia 08/01/2020   Dizziness 07/31/2020   Hydroureteronephrosis 02/10/2020   Nausea and vomiting 02/10/2020   Pneumonia due to COVID-19 virus 09/29/2019   Hyponatremia 09/28/2019   Sinus tachycardia 09/28/2019   Insomnia 08/26/2018   Nephropathy 03/26/2018   Urinary urgency 03/26/2018   Type 2 diabetes mellitus with diabetic nephropathy, without long-term current use of insulin ([place]) 03/26/2018   Essential hypertension 03/26/2018   Chronic kidney disease (CKD), stage III (moderate) ([place]) 03/26/2018   Conductive hearing loss, bilateral 11/07/2017   Microcytic anemia 02/17/2012   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   Benign hypertension 02/17/2012   Depressive disorder 02/17/2012    Orientation RESPIRATION BLADDER Height & Weight      Self, Time, Situation  Normal Incontinent, External catheter (External Urinary Catheter) Weight: 163 lb 2.3 oz (74 kg) Height:  '5\' 6"'$  (167.6 cm)  BEHAVIORAL SYMPTOMS/MOOD NEUROLOGICAL BOWEL NUTRITION STATUS      Continent (WDL) Diet (Please see discharge summary)  AMBULATORY STATUS COMMUNICATION OF NEEDS Skin   Limited Assist Verbally Other (Comment) (WDL)                       Personal Care Assistance Level of Assistance  Bathing, Feeding, Dressing Bathing Assistance: Limited assistance Feeding assistance: Independent (can feed self) Dressing Assistance: Limited assistance     Functional Limitations Info  Sight, Hearing, Speech Sight Info: Impaired Hearing Info: Adequate Speech Info: Adequate    SPECIAL CARE FACTORS FREQUENCY  PT (By licensed PT), OT (By licensed OT)     PT Frequency: 5x min weekly OT Frequency: 5x min weekly            Contractures Contractures Info: Not present    Additional Factors Info  Code Status, Allergies, Insulin Sliding Scale Code Status Info: FULL Allergies Info: Haldol (haloperidol)   Insulin Sliding Scale Info: insulin aspart (novoLOG) injection 0-15 Units 3 times daily with meals,insulin aspart (novoLOG) injection 0-5 Units daily at bedtime,insulin glargine-yfgn [facility]) injection 10 Units daily at bedtime       Current Medications (10/30/2021):  This is the current hospital active medication list Current Facility-Administered Medications  Medication Dose Route Frequency Provider Last Rate Last Admin   0.9 %  sodium chloride infusion  250 mL Intravenous PRN Martinique, Peter M, MD       acetaminophen (TYLENOL)  tablet 650 mg  650 mg Oral Q6H PRN Martinique, Peter M, MD   650 mg at 10/29/21 2138   Or   acetaminophen (TYLENOL) suppository 650 mg  650 mg Rectal Q6H PRN Martinique, Peter M, MD       amLODipine (NORVASC) tablet 5 mg  5 mg Oral Daily Martinique, Peter M, MD   5 mg at 10/30/21 1012   aspirin EC tablet 81 mg  81 mg Oral Daily  [place], Peter M, MD   81 mg at 10/30/21 1012   atorvastatin (LIPITOR) tablet 80 mg  80 mg Oral Daily Martinique, Peter M, MD   80 mg at 10/30/21 1012   finasteride (PROSCAR) tablet 5 mg  5 mg Oral Daily Martinique, Peter M, MD   5 mg at 10/30/21 1012   heparin injection 5,000 Units  5,000 Units Subcutaneous Q8H Martinique, Peter M, MD       insulin aspart (novoLOG) injection 0-15 Units  0-15 Units Subcutaneous TID [facility] Martinique, Peter M, MD   5 Units at 10/30/21 1244   insulin aspart (novoLOG) injection 0-5 Units  0-5 Units Subcutaneous QHS Martinique, Peter M, MD   2 Units at 10/29/21 2138   insulin glargine-yfgn (SEMGLEE) injection 10 Units  10 Units Subcutaneous QHS [place]ique, Peter M, MD   10 Units at 10/29/21 1353   metoprolol tartrate (LOPRESSOR) tablet 12.5 mg  12.5 mg Oral BID [place]ique, Peter M, MD   12.5 mg at 10/30/21 1012   sodium chloride flush (NS) 0.9 % injection 3 mL  3 mL Intravenous Q12H [place], Peter M, MD   3 mL at 10/30/21 1015   sodium chloride flush (NS) 0.9 % injection 3 mL  3 mL Intravenous PRN [place], Peter M, MD       ticagrelor [facility]) tablet 90 mg  90 mg Oral BID Martinique, Peter M, MD   90 mg at 10/30/21 1012     Discharge Medications: Please see discharge summary for a list of discharge medications.  Relevant Imaging Results:  Relevant Lab Results:   Additional Information [id number], Both Covid Vaccines  Milas Gain, LCSWA

## 2021-10-30 NOTE — Progress Notes (Signed)
ABI's have been completed. Preliminary results can be found in CV Proc through chart review.   10/30/21 11:10 AM [name] RVT

## 2021-10-30 NOTE — Progress Notes (Signed)
  Echocardiogram 2D Echocardiogram has been performed.  Eartha Inch, [place] 10/30/2021, 1:02 PM

## 2021-10-30 NOTE — TOC Benefit Eligibility Note (Signed)
Patient Advocate Encounter  Insurance verification completed.    The patient is currently admitted and upon discharge could be taking Brilinta 90 mg.  The current 30 day co-pay is $0.00.   The patient is insured through [employer] Medicare Part D    Timeka Goette, CPhT Pharmacy Patient Advocate Specialist [place] [facility] Patient Advocate Team Direct Number: [phone]  Fax: [phone]        

## 2021-10-30 NOTE — Progress Notes (Signed)
CARDIAC REHAB PHASE I   PRE:  Rate/Rhythm: 57 SR  BP:  Sitting: 121/81      SaO2: 94 RA  MODE:  Ambulation: 150 ft   POST:  Rate/Rhythm: 74 SR  BP:  Sitting: 112/89      SaO2: 95 RA  Pt walked in hall tolerated well using front wheel walker for balance stopping for a few standing breaks. Back to room to bed refused chair with call bell and bedside table in reach. Post stent education including risk factors, heart healthy diabetic diet, restrictions, exercise guidelines, site care,antiplatelet therapy importance, MI booklet,  exercise guidelines, CRP2. Will refer to [facility] for CRP2. Pt appears to have short term memory problems. I had to repeat myself several times during visit. He states I have given up and I don't want to take all these medications. I explained the importance of antiplatelet medications several times in great detail. Pt is worried about going  home social work and case management are working on home plan. Pt states over and over again I give up, I just don't care about anything. RN notified regarding pt statements she will follow up with MD. Will continue to follow.   [id number] Vanessa Barbara, RN BSN 10/30/2021 11:26 AM

## 2021-10-30 NOTE — Progress Notes (Signed)
Admitted to unit with some bleeding from TR band at right radial site, 12cc inflated. Attempted multiple times to decrease to 10ccs but increased bleeding occurred. Some cleaning performed and reassessment with charge nurse after each holding 20 minutes of manual pressure proximal to TR band. Paged physician and felt hemostasis achieved. Waited an hour an half before beginning to deflate but bleeding occurred again. With charge nurse, felt TR band not in proper position. Removed TR band and held 50 min of manual pressure with arm elevated. Bleeding currently stopped. Blood pressures stable. Tegaderm/gauze dressing applied with pressure and will continue to monitor.

## 2021-10-30 NOTE — TOC Progression Note (Signed)
Transition of Care [facility]) - Progression Note    Patient Details  Name: [name] MRN: [id number] Date of Birth: [birth year]  Transition of Care [facility]) CM/SW [place], [place] Phone Number: 10/30/2021, 5:29 PM  Clinical Narrative:     CSW provided patient with SNF bed offers. Patient chose SNF placement with [facility]. CSW followed up with Loma Boston from [facility] who confirmed they currently have no male beds. CSW to follow up with patient tomorrow on another SNF choice. CSW will continue to follow and assist with patients dc planning needs.  Expected Discharge Plan: Skilled Nursing Facility Barriers to Discharge: Continued Medical Work up  Expected Discharge Plan and Services Expected Discharge Plan: [place] In-house Referral: Clinical Social Work     Living arrangements for the past 2 months: Apartment                                       Social Determinants of Health (SDOH) Interventions    Readmission Risk Interventions    10/05/2019    4:42 PM  Readmission Risk Prevention Plan  Transportation Screening Complete  PCP or Specialist Appt within 5-7 Days Complete  Home Care Screening Complete  Medication Review (RN CM) Complete

## 2021-10-30 NOTE — Progress Notes (Signed)
Subjective:   Summary: [name] is a 66 y.o. year old male currently admitted on the IMTS HD#1 for chest pain secondary to CAD.  Overnight Events: NOE   Pt endorses feeling better after cath procedure done yesterday. He does say he is still having some chest tightness. He states he has trouble keeping up with his medications at home, as well as most ADLs/IADLs. He feels as if his whole body was letting him down as he aged.   Objective:  Vital signs in last 24 hours: Vitals:   10/30/21 0040 10/30/21 0400 10/30/21 0841 10/30/21 1012  BP: 126/78 109/80 137/79 121/81  Pulse: (!) 58 (!) 55 70 70  Resp: '13 15 18   '$ Temp:   97.6 F (36.4 C)   TempSrc:   Oral   SpO2: 93% 95% 95%   Weight:      Height:       Supplemental O2: Room Air SpO2: 95%   Physical Exam:  Constitutional: anxious looking man, laying in bed, in no acute distress Cardiovascular: RRR, no murmurs, rubs or gallops Pulmonary/Chest: normal work of breathing on room air, lungs clear to auscultation bilaterally Abdominal: soft, non-tender, non-distended Skin: warm and dry Extremities: upper/lower extremity pulses 2+, no lower extremity edema present  Filed Weights   10/29/21 0149 10/29/21 2125  Weight: 74.8 kg 74 kg     Intake/Output Summary (Last 24 hours) at 10/30/2021 1703 Last data filed at 10/30/2021 1602 Gross per 24 hour  Intake 498.24 ml  Output 800 ml  Net -301.76 ml   Net IO Since Admission: -301.76 mL [10/30/21 1703]  Pertinent Labs:    Latest Ref Rng & Units 10/30/2021    3:33 AM 10/29/2021    4:13 PM 10/29/2021    4:10 AM  CBC  WBC 4.0 - 10.5 K/uL 8.7   13.3   Hemoglobin 13.0 - 17.0 g/dL 17.9  18.0  19.9   Hematocrit 39.0 - 52.0 % 52.8  53.0  57.4   Platelets 150 - 400 K/uL 209   218        Latest Ref Rng & Units 10/30/2021    3:33 AM 10/29/2021    4:13 PM 10/29/2021    4:01 PM  CMP  Glucose 70 - 99 mg/dL 172  208  191   BUN 8 - 23 mg/dL 41  43  38   Creatinine  0.61 - 1.24 mg/dL 1.50  1.60  1.31   Sodium 135 - 145 mmol/L 139  138  141   Potassium 3.5 - 5.1 mmol/L 4.4  4.3  3.5   Chloride 98 - 111 mmol/L 105  100  110   CO2 22 - 32 mmol/L 25   27   Calcium 8.9 - 10.3 mg/dL 9.7   7.6     Imaging: ECHOCARDIOGRAM COMPLETE  Result Date: 10/30/2021    ECHOCARDIOGRAM REPORT   Patient Name:   [name] Date of Exam: 10/30/2021 Medical Rec #:  [id number]    Height:       66.0 in Accession #:    [id number]   Weight:       163.1 lb Date of Birth:  November 03, 1955    BSA:          1.834 m Patient Age:    47 years     BP:  112/89 mmHg Patient Gender: M            HR:           67 bpm. Exam Location:  Inpatient Procedure: 2D Echo, Intracardiac Opacification Agent, Cardiac Doppler and Color            Doppler Indications:    NSTEMI  History:        Patient has prior history of Echocardiogram examinations, most                 recent 09/04/2021. Risk Factors:Hypertension and Diabetes.                 Prostate Cancer ([place]), Anemia, CKD.  Sonographer:    Eartha Inch Referring Phys: [id number] PETER M [place]  Sonographer Comments: Suboptimal parasternal window. Image acquisition challenging due to patient body habitus and Image acquisition challenging due to respiratory motion. IMPRESSIONS  1. Left ventricular ejection fraction, by estimation, is 70 to 75%. The left ventricle has hyperdynamic function. The left ventricle demonstrates regional wall motion abnormalities (see scoring diagram/findings for description). Left ventricular diastolic parameters are consistent with Grade I diastolic dysfunction (impaired relaxation).  2. Right ventricular systolic function is normal. The right ventricular size is normal. Tricuspid regurgitation signal is inadequate for assessing PA pressure.  3. The mitral valve is grossly normal. Trivial mitral valve regurgitation. No evidence of mitral stenosis.  4. The aortic valve is tricuspid. Aortic valve regurgitation is not visualized. Aortic  valve sclerosis is present, with no evidence of aortic valve stenosis.  5. The inferior vena cava is normal in size with greater than 50% respiratory variability, suggesting right atrial pressure of 3 mmHg. Comparison(s): Changes from prior study are noted. New apical WMA. FINDINGS  Left Ventricle: Left ventricular ejection fraction, by estimation, is 70 to 75%. The left ventricle has hyperdynamic function. The left ventricle demonstrates regional wall motion abnormalities. Definity contrast agent was given IV to delineate the left  ventricular endocardial borders. The left ventricular internal cavity size was normal in size. There is no left ventricular hypertrophy. Left ventricular diastolic parameters are consistent with Grade I diastolic dysfunction (impaired relaxation).  LV Wall Scoring: The apical septal segment and apex are akinetic. Right Ventricle: The right ventricular size is normal. No increase in right ventricular wall thickness. Right ventricular systolic function is normal. Tricuspid regurgitation signal is inadequate for assessing PA pressure. Left Atrium: Left atrial size was normal in size. Right Atrium: Right atrial size was normal in size. Pericardium: Trivial pericardial effusion is present. Mitral Valve: The mitral valve is grossly normal. Trivial mitral valve regurgitation. No evidence of mitral valve stenosis. MV peak gradient, 4.0 mmHg. The mean mitral valve gradient is 1.0 mmHg. Tricuspid Valve: The tricuspid valve is grossly normal. Tricuspid valve regurgitation is trivial. No evidence of tricuspid stenosis. Aortic Valve: The aortic valve is tricuspid. Aortic valve regurgitation is not visualized. Aortic valve sclerosis is present, with no evidence of aortic valve stenosis. Pulmonic Valve: The pulmonic valve was grossly normal. Pulmonic valve regurgitation is not visualized. No evidence of pulmonic stenosis. Aorta: The aortic root and ascending aorta are structurally normal, with no  evidence of dilitation. Venous: The inferior vena cava is normal in size with greater than 50% respiratory variability, suggesting right atrial pressure of 3 mmHg. IAS/Shunts: The atrial septum is grossly normal.  LEFT VENTRICLE PLAX 2D LVIDd:         3.70 cm   Diastology LVIDs:  2.30 cm   LV e' medial:    5.87 cm/s LV PW:         1.20 cm   LV E/e' medial:  9.3 LV IVS:        1.20 cm   LV e' lateral:   7.62 cm/s LVOT diam:     1.80 cm   LV E/e' lateral: 7.1 LV SV:         69 LV SV Index:   38 LVOT Area:     2.54 cm  RIGHT VENTRICLE RV S prime:     10.50 cm/s TAPSE (M-mode): 1.6 cm LEFT ATRIUM             Index        RIGHT ATRIUM           Index LA diam:        3.40 cm 1.85 cm/m   RA Area:     10.40 cm LA Vol (A2C):   29.5 ml 16.09 ml/m  RA Volume:   21.40 ml  11.67 ml/m LA Vol (A4C):   40.7 ml 22.19 ml/m LA Biplane Vol: 34.9 ml 19.03 ml/m  AORTIC VALVE LVOT Vmax:   138.00 cm/s LVOT Vmean:  113.000 cm/s LVOT VTI:    0.273 m  AORTA Ao Root diam: 2.90 cm Ao Asc diam:  3.30 cm MITRAL VALVE MV Area (PHT): 2.21 cm    SHUNTS MV Area VTI:   2.39 cm    Systemic VTI:  0.27 m MV Peak grad:  4.0 mmHg    Systemic Diam: 1.80 cm MV Mean grad:  1.0 mmHg MV Vmax:       1.00 m/s MV Vmean:      56.9 cm/s MV Decel Time: 344 msec MV E velocity: 54.40 cm/s MV A velocity: 85.70 cm/s MV E/A ratio:  0.63 Eleonore Chiquito MD Electronically signed by Eleonore Chiquito MD Signature Date/Time: 10/30/2021/2:24:33 PM    Final    VAS [place] ABI WITH/WO TBI  Result Date: 10/30/2021  LOWER EXTREMITY DOPPLER STUDY Patient Name:  YASMIN DIBELLO  Date of Exam:   10/30/2021 Medical Rec #: [id number]     Accession #:    [id number] Date of Birth: 1956/02/18     Patient Gender: M Patient Age:   35 years Exam Location:  [facility] Procedure:      VAS [place] ABI WITH/WO TBI Referring Phys: Collier Salina [place] --------------------------------------------------------------------------------  Indications: Peripheral artery disease. High Risk Factors:  Hypertension, hyperlipidemia, Diabetes.  Comparison Study: No prior studies. Performing Technologist: Carlos Levering RVT  Examination Guidelines: A complete evaluation includes at minimum, Doppler waveform signals and systolic blood pressure reading at the level of bilateral brachial, anterior tibial, and posterior tibial arteries, when vessel segments are accessible. Bilateral testing is considered an integral part of a complete examination. Photoelectric Plethysmograph (PPG) waveforms and toe systolic pressure readings are included as required and additional duplex testing as needed. Limited examinations for reoccurring indications may be performed as noted.  ABI Findings: +---------+------------------+-----+-----------+--------+ Right    Rt Pressure (mmHg)IndexWaveform   Comment  +---------+------------------+-----+-----------+--------+ Brachial 120                    triphasic           +---------+------------------+-----+-----------+--------+ PTA      254               1.97 multiphasic         +---------+------------------+-----+-----------+--------+ DP  254               1.97 multiphasic         +---------+------------------+-----+-----------+--------+ Great Toe98                0.76                     +---------+------------------+-----+-----------+--------+ +---------+------------------+-----+-----------+-------+ Left     Lt Pressure (mmHg)IndexWaveform   Comment +---------+------------------+-----+-----------+-------+ Brachial 129                    triphasic          +---------+------------------+-----+-----------+-------+ PTA      254               1.97 multiphasic        +---------+------------------+-----+-----------+-------+ DP       254               1.97 multiphasic        +---------+------------------+-----+-----------+-------+ Great Toe90                0.70                    +---------+------------------+-----+-----------+-------+  +-------+-----------+-----------+------------+------------+ ABI/TBIToday's ABIToday's TBIPrevious ABIPrevious TBI +-------+-----------+-----------+------------+------------+ Right  [place]         0.76                                +-------+-----------+-----------+------------+------------+ Left   [place]a         0.7                                 +-------+-----------+-----------+------------+------------+  Summary: Right: Resting right ankle-brachial index indicates noncompressible right lower extremity arteries. The right toe-brachial index is normal. Left: Resting left ankle-brachial index indicates noncompressible left lower extremity arteries. The left toe-brachial index is normal. *See table(s) above for measurements and observations.     Preliminary      Assessment/Plan:   Principal Problem:   NSTEMI (non-ST elevated myocardial infarction) ([place]) Active Problems:   Depressive disorder   Type 2 diabetes mellitus with diabetic nephropathy, without long-term current use of insulin (HCC)   Essential hypertension   Chronic kidney disease (CKD), stage III (moderate) ([place])   Patient Summary: [name] is a 66 y.o. with a pertinent PMH of HTN, Type 2 DM, CKD 3A, Hyperlipidemia, prostate cancer, and depression who presented with chest pain and admitted for NSTEMI.    #NSTEMI Upon admission to the ED, pt's troponins were steadily increasing from 500 --> 3000 ---> 10000, so cardiology recommended to do the immediately. Pt underwent cath procedure yesterday and found to have a Mid LAD lesion that is 99% stenosed. Stent was placed which resolved stenosis to 0%. He endorses feeling no chest pain today, but some chest tightness. He denies any dyspnea at the moment.   ECHO done today showed an EF of 70-75%, and left ventricle with hyperdynamic functions with regional wall motion abnormalities, consistent with Grade 1 Diastolic dysfunction.   Pt is worried about discharge plans, as he  struggles with medication management and most ADL/IADLs. He has been in a [facility] previously, but was asked to leave because of aggressive language use. TOC is involved now trying to locate a facility for him, and PT/OT evaluation recommended SNF placement.  Plan:  - Per cardiology recommendations, will continue DAPT therapy with ASA 81 and Ticagrelor '90mg'$  BID for the next year  - Will control lipids with Lipitor 80 mg - Metoprolol 12.5 mg BID   #Type 2 DM Pt current medical regimen in the hospital includes SSI and Semglee 10 units. CGM readings have been in the 170-270 range today.   Plan: -Home dose of Semglee was 20, and due to sugar being higher than goal at the moment, we increased his semglee to 15U - Continue SSI    #CKD Pt's last GFR was 51, and creatinine was 1.5, improved from 1.6 the day before. Baseline creatinine seems to be 1.2-1.3. Procedure yesterday was done using contrast, so we will monitor his creatinine and GFR tomorrow and continue to hydrate him with NS.   Plan:  - Monitor BMP tomorrow  #Depression/Anxiety Disorder Pt expressed today that he feels like his body is letting him down as he ages. He consonantly was asking if there was a way to get his heart to how it was 30 years ago. Seems like he's hopeless about his situation.   Plan: -Provide reassurance that he is getting better day by day  -Consider SSRI     Diet: Normal IVF: NS,10cc/hr VTE: Heparin Code: Full PT/OT recs: SNF for Subacute PT, none.    Dispo: Anticipated discharge in less than 2 midnights  Drucie Opitz, MD PGY-1 Internal Medicine Resident Pager Number [phone] Please contact the on call pager after 5 pm and on weekends at [phone].

## 2021-10-30 NOTE — Progress Notes (Signed)
Sister Marcie Bal called for an update. Stated she is worried about her brother after discharging from the hospital as she believes he is about to be evicted from his current apartment; she believes it would be best if he went to a SNF and was closer to family. Explained that SW/CM is following his care and that they could reach out and loop everyone in. She left contact numbers for herself and the other sister.  Marcie Bal [phone] Kenney Houseman [phone]

## 2021-10-30 NOTE — Evaluation (Signed)
Occupational Therapy Evaluation Patient Details Name: [name] MRN: [id number] DOB: June 08, 1955 Today's Date: 10/30/2021   History of Present Illness 66 y/o male admitted secondary to NSTEMI. Pt is s/p heart cath with stent placement on 8/14. PMH includes DM, HTN, CKD, and prostate cancer.   Clinical Impression   PTA, pt was living alone and was performing ADLs and IADLs; difficulty with medication management due to macular degeneration and no long drives.  Pt currently requiring Min A for UB ADLs, Mod A for LB ADLs, and Min A for functional mobility. Pt presenting with decreased strength, balance, cognition, and safety. Throughout session, pt presenting with ST memory deficits and perseverating on certain topics. Pt would benefit from further acute OT to facilitate safe dc. Recommend dc to SNF for further OT to optimize safety, independence with ADLs, and return to PLOF.      Recommendations for follow up therapy are one component of a multi-disciplinary discharge planning process, led by the attending physician.  Recommendations may be updated based on patient status, additional functional criteria and insurance authorization.   Follow Up Recommendations  Skilled nursing-short term rehab (<3 hours/day)    Assistance Recommended at Discharge Frequent or constant Supervision/Assistance  Patient can return home with the following A little help with walking and/or transfers;A little help with bathing/dressing/bathroom;Assistance with cooking/housework;Direct supervision/assist for medications management;Direct supervision/assist for financial management;Assist for transportation    Functional Status Assessment  Patient has had a recent decline in their functional status and demonstrates the ability to make significant improvements in function in a reasonable and predictable amount of time.  Equipment Recommendations  Other (comment) (Defer to next venue)    Recommendations for Other  Services       Precautions / Restrictions Precautions Precautions: Fall Restrictions Weight Bearing Restrictions: No      Mobility Bed Mobility Overal bed mobility: Needs Assistance Bed Mobility: Supine to Sit, Sit to Supine     Supine to sit: Supervision Sit to supine: Supervision   General bed mobility comments: supervision for safety    Transfers Overall transfer level: Needs assistance Equipment used: None Transfers: Sit to/from Stand Sit to Stand: Min guard, Min assist           General transfer comment: Min guard to stand. Pt attempting to sit prematurely upon return to bed and required assist for controlled descent back to bed.      Balance Overall balance assessment: Needs assistance Sitting-balance support: No upper extremity supported, Feet supported Sitting balance-Leahy Scale: Fair     Standing balance support: No upper extremity supported Standing balance-Leahy Scale: Fair                             ADL either performed or assessed with clinical judgement   ADL Overall ADL's : Needs assistance/impaired Eating/Feeding: Set up;Sitting   Grooming: Supervision/safety;Set up;Sitting   Upper Body Bathing: Minimal assistance;Sitting   Lower Body Bathing: Moderate assistance;Sit to/from stand   Upper Body Dressing : Minimal assistance;Sitting   Lower Body Dressing: Moderate assistance;Sit to/from stand   Toilet Transfer: Minimal assistance;Ambulation;Rolling walker (2 wheels)           Functional mobility during ADLs: Minimal assistance General ADL Comments: Decreased cognition, balance, strength, and safety     Vision Baseline Vision/History: 6 Macular Degeneration       Perception     Praxis      Pertinent Vitals/Pain Pain Assessment Pain Assessment: No/denies  pain     Hand Dominance Right   Extremity/Trunk Assessment Upper Extremity Assessment Upper Extremity Assessment: Generalized weakness   Lower  Extremity Assessment Lower Extremity Assessment: Defer to PT evaluation   Cervical / Trunk Assessment Cervical / Trunk Assessment: Kyphotic   Communication Communication Communication: No difficulties   Cognition Arousal/Alertness: Awake/alert Behavior During Therapy: Restless Overall Cognitive Status: No family/caregiver present to determine baseline cognitive functioning                                 General Comments: Very repetitive throughout and stated his primofit was leaking. Educated not to pull on it, but pt continued to pull on it. Decreased safety awareness.     General Comments       Exercises     Shoulder Instructions      Home Living Family/patient expects to be discharged to:: Private residence Living Arrangements: Alone Available Help at Discharge:  (reports no one) Type of Home: House Home Access: Level entry     Home Layout: Two level Alternate Level Stairs-Number of Steps: flight Alternate Level Stairs-Rails: None Bathroom Shower/Tub: Teacher, early years/pre: Standard     Home Equipment: Shower seat;Grab bars - tub/shower          Prior Functioning/Environment Prior Level of Function : Independent/Modified Independent             Mobility Comments: Reports independence, but then reports difficulty with stair navigation. ADLs Comments: Performs ADLs and IADLs. However limited due to vision deficits. Needs assist with transportation. Reports he has not been taking meds because he cannot see which ones he's taking.        OT Problem List: Decreased strength;Decreased range of motion;Decreased activity tolerance;Impaired balance (sitting and/or standing);Decreased knowledge of use of DME or AE;Decreased knowledge of precautions      OT Treatment/Interventions: Self-care/ADL training;Therapeutic exercise;Energy conservation;DME and/or AE instruction;Therapeutic activities;Patient/family education    OT  Goals(Current goals can be found in the care plan section) Acute Rehab OT Goals Patient Stated Goal: Get stronger at rehab OT Goal Formulation: With patient Time For Goal Achievement: 11/13/21 Potential to Achieve Goals: Good  OT Frequency: Min 2X/week    Co-evaluation PT/OT/SLP Co-Evaluation/Treatment: Yes Reason for Co-Treatment: For patient/therapist safety;To address functional/ADL transfers   OT goals addressed during session: ADL's and self-care      AM-PAC OT "6 Clicks" Daily Activity     Outcome Measure Help from another person eating meals?: A Little Help from another person taking care of personal grooming?: A Little Help from another person toileting, which includes using toliet, bedpan, or urinal?: A Little Help from another person bathing (including washing, rinsing, drying)?: A Lot Help from another person to put on and taking off regular upper body clothing?: A Little Help from another person to put on and taking off regular lower body clothing?: A Lot 6 Click Score: 16   End of Session Nurse Communication: Mobility status  Activity Tolerance: Patient tolerated treatment well Patient left: in bed;with call bell/phone within reach;with bed alarm set;with family/visitor present  OT Visit Diagnosis: Other abnormalities of gait and mobility (R26.89);Unsteadiness on feet (R26.81);Muscle weakness (generalized) (M62.81)                Time: 6834-1962 OT Time Calculation (min): 20 min Charges:  OT General Charges $OT Visit: 1 Visit OT Evaluation $OT Eval Low Complexity: 1 Low  Klaryssa Fauth MSOT, OTR/L  Acute Rehab Office: [place] 10/30/2021, 3:52 PM

## 2021-10-30 NOTE — Evaluation (Signed)
Physical Therapy Evaluation Patient Details Name: [name] MRN: [id number] DOB: Aug 19, 1955 Today's Date: 10/30/2021  History of Present Illness  Pt is a 66 y/o male admitted secondary to NSTEMI. Pt is s/p heart cath with stent placement on 8/14. PMH includes DM, HTN, CKD, and prostate cancer.  Clinical Impression  Pt admitted secondary to problem above with deficits below. Pt with very poor insight and fixated on needing to fix primofit throughout. Required safety cues throughout. Pt currently lives alone and is at increased risk for falls. Will also have difficulty caring for himself. Recommending SNF level therapies at d/c. Will continue to follow acutely.        Recommendations for follow up therapy are one component of a multi-disciplinary discharge planning process, led by the attending physician.  Recommendations may be updated based on patient status, additional functional criteria and insurance authorization.  Follow Up Recommendations Skilled nursing-short term rehab (<3 hours/day) Can patient physically be transported by private vehicle: Yes    Assistance Recommended at Discharge Frequent or constant Supervision/Assistance  Patient can return home with the following  Direct supervision/assist for financial management;Direct supervision/assist for medications management;Assistance with cooking/housework;Help with stairs or ramp for entrance;Assist for transportation;A little help with walking and/or transfers;A little help with bathing/dressing/bathroom    Equipment Recommendations Rolling walker (2 wheels)  Recommendations for Other Services       Functional Status Assessment Patient has had a recent decline in their functional status and demonstrates the ability to make significant improvements in function in a reasonable and predictable amount of time.     Precautions / Restrictions Precautions Precautions: Fall Restrictions Weight Bearing Restrictions: No       Mobility  Bed Mobility Overal bed mobility: Needs Assistance Bed Mobility: Supine to Sit, Sit to Supine     Supine to sit: Supervision Sit to supine: Supervision   General bed mobility comments: supervision for safety    Transfers Overall transfer level: Needs assistance Equipment used: None Transfers: Sit to/from Stand Sit to Stand: Min guard, Min assist           General transfer comment: Min guard to stand. Pt attempting to sit prematurely upon return to bed and required assist for controlled descent back to bed.    Ambulation/Gait Ambulation/Gait assistance: Min assist, Min guard Gait Distance (Feet): 15 Feet Assistive device: None Gait Pattern/deviations: Step-through pattern, Decreased stride length Gait velocity: Decreased     General Gait Details: Min guard to min A for steadying. Only wanting to ambulate short distance and then reports he needs to sit down. Did not want to sit in chair so ambulated back to bed.  Stairs            Wheelchair Mobility    Modified Rankin (Stroke Patients Only)       Balance Overall balance assessment: Needs assistance Sitting-balance support: No upper extremity supported, Feet supported Sitting balance-Leahy Scale: Fair     Standing balance support: No upper extremity supported Standing balance-Leahy Scale: Fair                               Pertinent Vitals/Pain Pain Assessment Pain Assessment: No/denies pain    Home Living Family/patient expects to be discharged to:: Private residence Living Arrangements: Alone Available Help at Discharge:  (reports no one) Type of Home: House Home Access: Level entry     Alternate Level Stairs-Number of Steps: flight Home Layout: Two level  Home Equipment: Shower seat;Grab bars - tub/shower      Prior Function Prior Level of Function : Independent/Modified Independent             Mobility Comments: Reports independence, but then reports  difficulty with stair navigation. ADLs Comments: Needs assist with transportation. Reports he has not been taking meds because he cannot see which ones he's taking.     Hand Dominance        Extremity/Trunk Assessment   Upper Extremity Assessment Upper Extremity Assessment: Defer to OT evaluation    Lower Extremity Assessment Lower Extremity Assessment: Generalized weakness    Cervical / Trunk Assessment Cervical / Trunk Assessment: Kyphotic  Communication   Communication: No difficulties  Cognition Arousal/Alertness: Awake/alert Behavior During Therapy: Restless Overall Cognitive Status: No family/caregiver present to determine baseline cognitive functioning                                 General Comments: Very repetitive throughout and stated his primofit was leaking. Educated not to pull on it, but pt continued to pull on it. Decreased safety awareness.        General Comments      Exercises     Assessment/Plan    PT Assessment Patient needs continued PT services  PT Problem List Decreased strength;Decreased activity tolerance;Decreased balance;Decreased mobility;Decreased cognition;Decreased knowledge of use of DME;Decreased safety awareness;Decreased knowledge of precautions       PT Treatment Interventions DME instruction;Gait training;Functional mobility training;Therapeutic activities;Therapeutic exercise;Stair training;Balance training;Patient/family education    PT Goals (Current goals can be found in the Care Plan section)  Acute Rehab PT Goals Patient Stated Goal: to go to rehab PT Goal Formulation: With patient Time For Goal Achievement: 11/13/21 Potential to Achieve Goals: Fair    Frequency Min 2X/week     Co-evaluation               AM-PAC PT "6 Clicks" Mobility  Outcome Measure Help needed turning from your back to your side while in a flat bed without using bedrails?: A Little Help needed moving from lying on your  back to sitting on the side of a flat bed without using bedrails?: A Little Help needed moving to and from a bed to a chair (including a wheelchair)?: A Little Help needed standing up from a chair using your arms (e.g., wheelchair or bedside chair)?: A Little Help needed to walk in hospital room?: A Little Help needed climbing 3-5 steps with a railing? : A Lot 6 Click Score: 17    End of Session   Activity Tolerance: Patient limited by fatigue Patient left: in bed;with bed alarm set;with call bell/phone within reach;with nursing/sitter in room Nurse Communication: Mobility status PT Visit Diagnosis: Unsteadiness on feet (R26.81);Muscle weakness (generalized) (M62.81);Difficulty in walking, not elsewhere classified (R26.2)    Time: 5697-9480 PT Time Calculation (min) (ACUTE ONLY): 24 min   Charges:   PT Evaluation $PT Eval Moderate Complexity: 1 Mod          Reuel Derby, PT, DPT  Acute Rehabilitation Services  Office: [phone]   Rudean Hitt 10/30/2021, 1:38 PM

## 2021-10-30 NOTE — Hospital Course (Signed)
8/15:  Feels fine right now, but has episodes where he feels bad. Episodes where he has some minor chest pain and SHOB. Feels better today, not nearly as bad as it was.  Overall feels like his whole body is letting him down. He did not have any problems about 30 years ago, but now he has diabetes, blood pressure, macular degeneration. Has to take so many different medications that it is very hard for him. He has difficulty even with 3 pills. Would like more assistance if possible. Does not have anyone at home to help him. Unable to cook for himself.   Heart doctors told him that everything should be alright. Put a stent in one of his vessels.

## 2021-10-31 ENCOUNTER — Other Ambulatory Visit (HOSPITAL_COMMUNITY): Payer: Self-pay

## 2021-10-31 DIAGNOSIS — I200 Unstable angina: Secondary | ICD-10-CM | POA: Diagnosis not present

## 2021-10-31 DIAGNOSIS — N390 Urinary tract infection, site not specified: Secondary | ICD-10-CM | POA: Diagnosis not present

## 2021-10-31 DIAGNOSIS — I129 Hypertensive chronic kidney disease with stage 1 through stage 4 chronic kidney disease, or unspecified chronic kidney disease: Secondary | ICD-10-CM | POA: Diagnosis not present

## 2021-10-31 DIAGNOSIS — I214 Non-ST elevation (NSTEMI) myocardial infarction: Secondary | ICD-10-CM | POA: Diagnosis not present

## 2021-10-31 DIAGNOSIS — U071 COVID-19: Secondary | ICD-10-CM | POA: Diagnosis not present

## 2021-10-31 DIAGNOSIS — F32A Depression, unspecified: Secondary | ICD-10-CM | POA: Diagnosis not present

## 2021-10-31 DIAGNOSIS — N1330 Unspecified hydronephrosis: Secondary | ICD-10-CM | POA: Diagnosis not present

## 2021-10-31 DIAGNOSIS — G4700 Insomnia, unspecified: Secondary | ICD-10-CM | POA: Diagnosis not present

## 2021-10-31 DIAGNOSIS — I25110 Atherosclerotic heart disease of native coronary artery with unstable angina pectoris: Secondary | ICD-10-CM | POA: Diagnosis not present

## 2021-10-31 DIAGNOSIS — E119 Type 2 diabetes mellitus without complications: Secondary | ICD-10-CM | POA: Diagnosis not present

## 2021-10-31 DIAGNOSIS — N289 Disorder of kidney and ureter, unspecified: Secondary | ICD-10-CM | POA: Diagnosis not present

## 2021-10-31 DIAGNOSIS — N189 Chronic kidney disease, unspecified: Secondary | ICD-10-CM | POA: Diagnosis not present

## 2021-10-31 DIAGNOSIS — R531 Weakness: Secondary | ICD-10-CM | POA: Diagnosis not present

## 2021-10-31 DIAGNOSIS — E785 Hyperlipidemia, unspecified: Secondary | ICD-10-CM | POA: Diagnosis not present

## 2021-10-31 DIAGNOSIS — I2510 Atherosclerotic heart disease of native coronary artery without angina pectoris: Secondary | ICD-10-CM | POA: Diagnosis not present

## 2021-10-31 DIAGNOSIS — R2681 Unsteadiness on feet: Secondary | ICD-10-CM | POA: Diagnosis not present

## 2021-10-31 DIAGNOSIS — R000 Tachycardia, unspecified: Secondary | ICD-10-CM | POA: Diagnosis not present

## 2021-10-31 DIAGNOSIS — Z7982 Long term (current) use of aspirin: Secondary | ICD-10-CM | POA: Diagnosis not present

## 2021-10-31 DIAGNOSIS — E1122 Type 2 diabetes mellitus with diabetic chronic kidney disease: Secondary | ICD-10-CM | POA: Diagnosis not present

## 2021-10-31 DIAGNOSIS — R338 Other retention of urine: Secondary | ICD-10-CM | POA: Diagnosis not present

## 2021-10-31 DIAGNOSIS — R112 Nausea with vomiting, unspecified: Secondary | ICD-10-CM | POA: Diagnosis not present

## 2021-10-31 DIAGNOSIS — Z7401 Bed confinement status: Secondary | ICD-10-CM | POA: Diagnosis not present

## 2021-10-31 DIAGNOSIS — I252 Old myocardial infarction: Secondary | ICD-10-CM | POA: Diagnosis not present

## 2021-10-31 DIAGNOSIS — K219 Gastro-esophageal reflux disease without esophagitis: Secondary | ICD-10-CM | POA: Diagnosis not present

## 2021-10-31 DIAGNOSIS — R55 Syncope and collapse: Secondary | ICD-10-CM | POA: Diagnosis not present

## 2021-10-31 DIAGNOSIS — I10 Essential (primary) hypertension: Secondary | ICD-10-CM | POA: Diagnosis not present

## 2021-10-31 DIAGNOSIS — R079 Chest pain, unspecified: Secondary | ICD-10-CM | POA: Diagnosis not present

## 2021-10-31 DIAGNOSIS — H3530 Unspecified macular degeneration: Secondary | ICD-10-CM | POA: Diagnosis not present

## 2021-10-31 DIAGNOSIS — R42 Dizziness and giddiness: Secondary | ICD-10-CM | POA: Diagnosis not present

## 2021-10-31 DIAGNOSIS — R0789 Other chest pain: Secondary | ICD-10-CM | POA: Diagnosis not present

## 2021-10-31 DIAGNOSIS — Z91199 Patient's noncompliance with other medical treatment and regimen due to unspecified reason: Secondary | ICD-10-CM | POA: Diagnosis not present

## 2021-10-31 DIAGNOSIS — N183 Chronic kidney disease, stage 3 unspecified: Secondary | ICD-10-CM | POA: Diagnosis not present

## 2021-10-31 DIAGNOSIS — E1121 Type 2 diabetes mellitus with diabetic nephropathy: Secondary | ICD-10-CM | POA: Diagnosis not present

## 2021-10-31 DIAGNOSIS — Z79899 Other long term (current) drug therapy: Secondary | ICD-10-CM | POA: Diagnosis not present

## 2021-10-31 DIAGNOSIS — Z743 Need for continuous supervision: Secondary | ICD-10-CM | POA: Diagnosis not present

## 2021-10-31 DIAGNOSIS — D751 Secondary polycythemia: Secondary | ICD-10-CM | POA: Diagnosis not present

## 2021-10-31 DIAGNOSIS — N1831 Chronic kidney disease, stage 3a: Secondary | ICD-10-CM | POA: Diagnosis not present

## 2021-10-31 DIAGNOSIS — R6889 Other general symptoms and signs: Secondary | ICD-10-CM | POA: Diagnosis not present

## 2021-10-31 DIAGNOSIS — H900 Conductive hearing loss, bilateral: Secondary | ICD-10-CM | POA: Diagnosis not present

## 2021-10-31 DIAGNOSIS — Z794 Long term (current) use of insulin: Secondary | ICD-10-CM | POA: Diagnosis not present

## 2021-10-31 DIAGNOSIS — D509 Iron deficiency anemia, unspecified: Secondary | ICD-10-CM | POA: Diagnosis not present

## 2021-10-31 DIAGNOSIS — E871 Hypo-osmolality and hyponatremia: Secondary | ICD-10-CM | POA: Diagnosis not present

## 2021-10-31 DIAGNOSIS — R270 Ataxia, unspecified: Secondary | ICD-10-CM | POA: Diagnosis not present

## 2021-10-31 DIAGNOSIS — Z955 Presence of coronary angioplasty implant and graft: Secondary | ICD-10-CM | POA: Diagnosis not present

## 2021-10-31 LAB — BASIC METABOLIC PANEL
Anion gap: 10 (ref 5–15)
BUN: 42 mg/dL — ABNORMAL HIGH (ref 8–23)
CO2: 23 mmol/L (ref 22–32)
Calcium: 9.5 mg/dL (ref 8.9–10.3)
Chloride: 104 mmol/L (ref 98–111)
Creatinine, Ser: 1.64 mg/dL — ABNORMAL HIGH (ref 0.61–1.24)
GFR, Estimated: 46 mL/min — ABNORMAL LOW (ref >60)
Glucose, Bld: 157 mg/dL — ABNORMAL HIGH (ref 70–99)
Potassium: 4.1 mmol/L (ref 3.5–5.1)
Sodium: 137 mmol/L (ref 135–145)

## 2021-10-31 LAB — GLUCOSE, CAPILLARY
Glucose-Capillary: 166 mg/dL — ABNORMAL HIGH (ref 70–99)
Glucose-Capillary: 218 mg/dL — ABNORMAL HIGH (ref 70–99)

## 2021-10-31 LAB — LIPOPROTEIN A (LPA): Lipoprotein (a): 87.1 nmol/L — ABNORMAL HIGH (ref <75.0)

## 2021-10-31 MED ORDER — ASPIRIN 81 MG PO TBEC
81.0000 mg | DELAYED_RELEASE_TABLET | Freq: Every day | ORAL | 12 refills | Status: DC
  Filled 2021-10-31: qty 30, 30d supply, fill #0

## 2021-10-31 MED ORDER — METOPROLOL SUCCINATE ER 25 MG PO TB24
25.0000 mg | ORAL_TABLET | Freq: Every day | ORAL | Status: DC
  Administered 2021-10-31: 25 mg via ORAL
  Filled 2021-10-31: qty 1

## 2021-10-31 MED ORDER — AMLODIPINE BESYLATE 5 MG PO TABS
5.0000 mg | ORAL_TABLET | Freq: Every day | ORAL | 0 refills | Status: DC
  Filled 2021-10-31: qty 30, 30d supply, fill #0

## 2021-10-31 MED ORDER — ATORVASTATIN CALCIUM 80 MG PO TABS
80.0000 mg | ORAL_TABLET | Freq: Every day | ORAL | 0 refills | Status: DC
  Filled 2021-10-31: qty 30, 30d supply, fill #0

## 2021-10-31 MED ORDER — METOPROLOL SUCCINATE ER 25 MG PO TB24
25.0000 mg | ORAL_TABLET | Freq: Every day | ORAL | 0 refills | Status: DC
  Filled 2021-10-31: qty 30, 30d supply, fill #0

## 2021-10-31 MED ORDER — METOPROLOL SUCCINATE ER 25 MG PO TB24
25.0000 mg | ORAL_TABLET | Freq: Every day | ORAL | Status: DC
  Filled 2021-10-31: qty 1

## 2021-10-31 MED ORDER — FINASTERIDE 5 MG PO TABS
5.0000 mg | ORAL_TABLET | Freq: Every day | ORAL | 0 refills | Status: DC
  Filled 2021-10-31: qty 30, 30d supply, fill #0

## 2021-10-31 MED ORDER — TICAGRELOR 90 MG PO TABS
90.0000 mg | ORAL_TABLET | Freq: Two times a day (BID) | ORAL | 0 refills | Status: DC
  Filled 2021-10-31: qty 60, 30d supply, fill #0

## 2021-10-31 MED ORDER — SERTRALINE HCL 50 MG PO TABS
50.0000 mg | ORAL_TABLET | Freq: Every day | ORAL | 2 refills | Status: DC
  Filled 2021-10-31: qty 30, 30d supply, fill #0

## 2021-10-31 NOTE — Discharge Instructions (Signed)
You were hospitalized for an NSTEMI, which is essentially a heart attack. We were able to clear the blockage in your heart throughout your stay here. We've given you some medications as well to take, and hopefully the nursing facility you're heading to will help you take them daily! Please set an appointment with cardiology for follow up, as well as making your appointment to [facility] on 9/6. We'd also like you to set an appointment with a urologist to check your prostate.   It was a pleasure being a part of your care!

## 2021-10-31 NOTE — TOC Progression Note (Addendum)
Transition of Care [facility]) - Progression Note    Patient Details  Name: NICHOLLAS PERUSSE MRN: [id number] Date of Birth: 06-Feb-1956  Transition [facility] Care [facility] CM/SW [place], [place] Phone Number: 10/31/2021, 10:11 AM  Clinical Narrative:     Update- Patients insurance authorization has been approved. Plan Auth ID# [id number] [place] ID# [id number]. Insurance authorization approved from 8/16-8/18.Patient has SNF bed at [facility]   CSW informed patient at bedside that [facility] no male beds. Patient accepted bed offer with [facility]. CSW spoke with Altha Harm with [facility] who confirmed SNF bed for patient. Patients insurance authorization currently pending. CSW will continue to follow and assist with patients dc planning needs.  Expected Discharge Plan: Skilled Nursing Facility Barriers to Discharge: Continued Medical Work up  Expected Discharge Plan and Services Expected Discharge Plan: [place] In-house Referral: Clinical Social Work     Living arrangements for the past 2 months: Apartment                                       Social Determinants of Health (SDOH) Interventions    Readmission Risk Interventions    10/05/2019    4:42 PM  Readmission Risk Prevention Plan  Transportation Screening Complete  PCP or Specialist Appt within 5-7 Days Complete  Home Care Screening Complete  Medication Review (RN CM) Complete

## 2021-10-31 NOTE — TOC Transition Note (Signed)
Transition of Care [facility]) - CM/SW Discharge Note   Patient Details  Name: [name] MRN: [id number] Date of Birth: [birth year]  Transition of Care [facility]) CM/SW Contact:  Milas Gain, [place] Phone Number: 10/31/2021, 2:07 PM   Clinical Narrative:     Patient will DC to: [facility]  Anticipated DC date: 10/31/2021  Family notified: Patient declined-Patient informed CSW he will let his friend Clair Gulling know that he is going to dc.  Transport by: Corey Harold  ?  Per MD patient ready for DC to [facility] . RN, patient, patient's family, and facility notified of DC. Discharge Summary sent to facility. RN given number for report tele# [phone] RM# [id number]. DC packet on chart. Ambulance transport requested for patient.  CSW signing off.    Final next level of care: Skilled Nursing Facility Barriers to Discharge: No Barriers Identified   Patient Goals and CMS Choice Patient states their goals for this hospitalization and ongoing recovery are:: SNF CMS Medicare.gov Compare Post Acute Care list provided to:: Patient Choice offered to / list presented to : Patient  Discharge Placement              Patient chooses bed at:  [facility]) Patient to be transferred to facility by: [facility] Name of family member notified: Tim Patient and family notified of of transfer: 10/31/21  Discharge Plan and Services In-house Referral: Clinical Social Work                                   Social Determinants of Health ([place]) Interventions     Readmission Risk Interventions    10/05/2019    4:42 PM  Readmission Risk Prevention Plan  Transportation Screening Complete  PCP or Specialist Appt within 5-7 Days Complete  Home Care Screening Complete  Medication Review (RN CM) Complete

## 2021-10-31 NOTE — Discharge Summary (Signed)
Name: [name] MRN: [id number] DOB: 1955/05/03 66 y.o. PCP: Pcp, No  Date of Admission: 10/29/2021  1:45 AM Date of Discharge: 10/31/21 Attending Physician: Angelica Pou, MD  Discharge Diagnosis: 1. Principal Problem:   NSTEMI (non-ST elevated myocardial infarction) ([place]) Active Problems:   Depressive disorder   Type 2 diabetes mellitus with diabetic nephropathy, without long-term current use of insulin (HCC)   Essential hypertension   Chronic kidney disease (CKD), stage III (moderate) ([place])    Discharge Medications: Allergies as of 10/31/2021       Reactions   Haldol [haloperidol] Other (See Comments)   Per family cause life threatening complications to patient Per Pt, it causes "complications with my mouth"        Medication List     STOP taking these medications    hydrOXYzine 25 MG tablet Commonly known as: ATARAX   meclizine 12.5 MG tablet Commonly known as: ANTIVERT   methylPREDNISolone 4 MG Tbpk tablet Commonly known as: MEDROL DOSEPAK   olmesartan-hydrochlorothiazide 40-25 MG tablet Commonly known as: BENICAR HCT   Rybelsus 14 MG Tabs Generic drug: Semaglutide   traZODone 50 MG tablet Commonly known as: DESYREL       TAKE these medications    amLODipine 5 MG tablet Commonly known as: NORVASC Take 1 tablet (5 mg total) by mouth daily. Start taking on: November 01, 2021   aspirin EC 81 MG tablet Take 1 tablet (81 mg total) by mouth daily. Swallow whole. Start taking on: November 01, 2021   atorvastatin 80 MG tablet Commonly known as: LIPITOR Take 1 tablet (80 mg total) by mouth daily. Start taking on: November 01, 2021 What changed:  medication strength how much to take   finasteride 5 MG tablet Commonly known as: PROSCAR Take 1 tablet (5 mg total) by mouth daily. Start taking on: November 01, 2021   insulin aspart 100 UNIT/ML injection Commonly known as: novoLOG Inject 4 Units into the skin 3 (three) times daily with  meals.   insulin glargine-yfgn 100 UNIT/ML injection Commonly known as: SEMGLEE Inject 0.2 mLs (20 Units total) into the skin daily.   metoprolol succinate 25 MG 24 hr tablet Commonly known as: TOPROL-XL Take 1 tablet (25 mg total) by mouth daily.   sertraline 50 MG tablet Commonly known as: ZOLOFT Take 1 tablet (50 mg total) by mouth daily. What changed: Another medication with the same name was added. Make sure you understand how and when to take each.   sertraline 50 MG tablet Commonly known as: Zoloft Take 1 tablet (50 mg total) by mouth daily. What changed: You were already taking a medication with the same name, and this prescription was added. Make sure you understand how and when to take each.   ticagrelor 90 MG Tabs tablet Commonly known as: BRILINTA Take 1 tablet (90 mg total) by mouth 2 (two) times daily.        Disposition and follow-up:   [name] was discharged from [facility] in Good condition.  At the hospital follow up visit please address:  1.  Please make sure he has the following: A cardiology appt, urology appt, depression screening. He also has an appointment with podiatry on 9/6. Peripheral pulse check as well.   2.  Labs / imaging needed at time of follow-up: BMP, LFT in 3-4 months due to statin initiation  3.  Pending labs/ test needing follow-up: NA  Follow-up Appointments:  Contact information for after-discharge care  Destination     [facility] .   Service: Skilled Nursing Contact information: [address] [place] Hospital Course by Problem List:  #NSTEMI #CAD #HTN Patient presented to the emergency department on 8/14 after complaining of severe chest pain that radiated to his jaw after coming back to his apartment from the grocery store. Troponins were trended and went from 568, to 3473, to 9554, to  10903. His EKG showed sinus bradycardia with no signs of ST segment elevation. Due to his un-resolving chest pain and troponins continually increasing, the diagnosis of NSTEMI was made. He was immediately taken to the cath lab where a 99% stenosis of the Mid LAD was stenosed. A drug-eluting stent was placed, and post intervention there was a 0% residual stenosis. Echo was also done while in the hospital which showed an EF of 70-75% and left ventricle that is hyperdynamic with regional wall abnormalities.   He was placed on DAPT with Aspirin and Ticagrelor '90mg'$  BID, and cardiology recommends continuing this regimen for a year. His blood pressure has been well controlled with the regimen of amlodipine '5mg'$  and metoprolol '25mg'$ .  #Hyperlipidemia Pt current goal is LDL <70. Last lipid panel was done on 10/29/21 which showed a cholesterol of 149, HDL of 28, and LDL of 97. Initiated Lipitor '80mg'$  this hospital visit, would continue outpatient as well.   #CKD Pt seems to have a baseline of 1.2-1.3 creatinine level. His creatinine on day of discharge was 1.64, and GFR was 46. He underwent a catherization of his heart using contrast, which is most likely cause of acute dysfunction.   #Type 2 Diabetes Mellitus Pt medical regimen in the hospital included a sliding scale of insulin,and Semglee 10 units once a day. His CGM readings have been between 170-300 throughout his course in the hospital. His home regimen was Novolog 4U, and  semglee 20U, and we would like him to continue this outpatient.   #Prostate Cancer  Pt has a history of prostate cancer, and he has been complaining of trouble urinating. He was to have had a prostate MRI, but unclear if ever done. Advised him to please make patient with urologist, in the meanwhile have given him finasteride for outpatient use.   #Depression Pt was previously on Zoloft and has not received it in the hospital, restarting him on it in the outpatient setting.     Discharge Exam:   BP 113/75 (BP Location: Left Arm)   Pulse 84   Temp 97.8 F (36.6 C) (Oral)   Resp 20   Ht '5\' 6"'$  (1.676 m)   Wt 72.7 kg   SpO2 96%   BMI 25.87 kg/m  Discharge exam:   Constitutional: Anxious looking man, laying in bed, in no acute distress  Cardiovascular: No murmurs  or gallops heard, RRR Pulmonary: Normal work of breathing, lungs clear to auscultation bilaterally Skin: Warm and Dry  Extremities: +1 distal LE pulse on R side, +2 on L side. Thickened yellow nails with dirt.   Pertinent Labs, Studies, and Procedures:     Latest Ref Rng & Units 10/30/2021    3:33 AM 10/29/2021    4:13 PM 10/29/2021    4:10 AM  CBC  WBC 4.0 - 10.5 K/uL 8.7   13.3   Hemoglobin 13.0 - 17.0 g/dL 17.9  18.0  19.9   Hematocrit 39.0 - 52.0 % 52.8  53.0  57.4   Platelets 150 - 400 K/uL 209   218        Latest Ref Rng & Units 10/31/2021    4:59 AM 10/30/2021    3:33 AM 10/29/2021    4:13 PM  BMP  Glucose 70 - 99 mg/dL 157  172  208   BUN 8 - 23 mg/dL 42  41  43   Creatinine 0.61 - 1.24 mg/dL 1.64  1.50  1.60   Sodium 135 - 145 mmol/L 137  139  138   Potassium 3.5 - 5.1 mmol/L 4.1  4.4  4.3   Chloride 98 - 111 mmol/L 104  105  100   CO2 22 - 32 mmol/L 23  25    Calcium 8.9 - 10.3 mg/dL 9.5  9.7       Discharge Instructions: Discharge Instructions     Amb Referral to Cardiac Rehabilitation   Complete by: As directed    Diagnosis:  Coronary Stents NSTEMI     After initial evaluation and assessments completed: Virtual Based Care may be provided alone or in conjunction with Phase 2 Cardiac Rehab based on patient barriers.: Yes   Call MD for:  persistant nausea and vomiting   Complete by: As directed    Call MD for:  severe uncontrolled pain   Complete by: As directed    Call MD for:  temperature >100.4   Complete by: As directed    Diet - low sodium heart healthy   Complete by: As directed    Increase activity slowly   Complete by: As directed         Signed: Drucie Opitz, MD 10/31/2021, 1:29 PM   Pager: [phone]

## 2021-10-31 NOTE — Progress Notes (Addendum)
Progress Note  Patient Name: [name] Date of Encounter: 10/31/2021  [facility] Cardiologist: Larae Grooms, MD   Subjective   Patient continues to have some mild chest discomfort, most noticeable when he get depressed or anxious. Denies SOB. Is nervous about where he will stay after discharge, Social work involved with arranging SNF.   Inpatient Medications    Scheduled Meds:  amLODipine  5 mg Oral Daily   aspirin EC  81 mg Oral Daily   atorvastatin  80 mg Oral Daily   finasteride  5 mg Oral Daily   heparin  5,000 Units Subcutaneous Q8H   insulin aspart  0-15 Units Subcutaneous TID WC   insulin aspart  0-5 Units Subcutaneous QHS   insulin glargine-yfgn  15 Units Subcutaneous QHS   metoprolol tartrate  12.5 mg Oral BID   sodium chloride flush  3 mL Intravenous Q12H   ticagrelor  90 mg Oral BID   Continuous Infusions:  sodium chloride     PRN Meds: sodium chloride, acetaminophen **OR** acetaminophen, sodium chloride flush   Vital Signs    Vitals:   10/30/21 0841 10/30/21 1012 10/30/21 2056 10/31/21 0524  BP: 137/79 121/81 90/63 113/75  Pulse: 70 70 84   Resp: '18  16 20  '$ Temp: 97.6 F (36.4 C)  98.6 F (37 C) 97.8 F (36.6 C)  TempSrc: Oral  Oral Oral  SpO2: 95%   96%  Weight:    72.7 kg  Height:        Intake/Output Summary (Last 24 hours) at 10/31/2021 0911 Last data filed at 10/31/2021 0500 Gross per 24 hour  Intake 138.24 ml  Output 1000 ml  Net -861.76 ml      10/31/2021    5:24 AM 10/29/2021    9:25 PM 10/29/2021    1:49 AM  Last 3 Weights  Weight (lbs) 160 lb 4.8 oz 163 lb 2.3 oz 164 lb 14.5 oz  Weight (kg) 72.712 kg 74 kg 74.8 kg      Telemetry    Sinus rhythm, HR in the 60s - Personally Reviewed  ECG    No new tracings since 8/14 - Personally Reviewed  Physical Exam   GEN: No acute distress.  Sitting upright in the bed Neck: No JVD Cardiac: RRR, no murmurs, rubs, or gallops. Right radial cath site is soft, mildly tender to  palpation. Radial pulses 2+ bilaterally  Respiratory: Clear to auscultation bilaterally. GI: Soft, tender to palpation  MS: No edema; No deformity. Neuro:  Nonfocal  Psych: Depressed mood   Labs    High Sensitivity Troponin:   Recent Labs  Lab 10/29/21 0410 10/29/21 0611 10/29/21 1424 10/29/21 1601  TROPONINIHS 568* 3,473* 9,554* 10,903*     Chemistry Recent Labs  Lab 10/29/21 0410 10/29/21 1601 10/29/21 1613 10/30/21 0333 10/31/21 0459  NA 135 141 138 139 137  K 4.5 3.5 4.3 4.4 4.1  CL 97* 110 100 105 104  CO2 26 27  --  25 23  GLUCOSE 352* 191* 208* 172* 157*  BUN 54* 38* 43* 41* 42*  CREATININE 1.79* 1.31* 1.60* 1.50* 1.64*  CALCIUM 10.2 7.6*  --  9.7 9.5  PROT 7.9  --   --   --   --   ALBUMIN 4.2  --   --   --   --   AST 23  --   --   --   --   ALT 30  --   --   --   --  ALKPHOS 133*  --   --   --   --   BILITOT 1.1  --   --   --   --   GFRNONAA 41* 60*  --  51* 46*  ANIONGAP 12 4*  --  9 10    Lipids  Recent Labs  Lab 10/29/21 1507  CHOL 149  TRIG 137  HDL 28*  LDLCALC 94  CHOLHDL 5.3    Hematology Recent Labs  Lab 10/29/21 0410 10/29/21 1613 10/30/21 0333  WBC 13.3*  --  8.7  RBC 6.77*  --  6.13*  HGB 19.9* 18.0* 17.9*  HCT 57.4* 53.0* 52.8*  MCV 84.8  --  86.1  MCH 29.4  --  29.2  MCHC 34.7  --  33.9  RDW 13.3  --  13.2  PLT 218  --  209   Thyroid No results for input(s): "TSH", "FREET4" in the last 168 hours.  BNPNo results for input(s): "BNP", "PROBNP" in the last 168 hours.  DDimer No results for input(s): "DDIMER" in the last 168 hours.   Radiology    VAS [place] ABI WITH/WO TBI  Result Date: 10/30/2021  LOWER EXTREMITY DOPPLER STUDY Patient Name:  [name]  Date of Exam:   10/30/2021 Medical Rec #: [id number]     Accession #:    [id number] Date of Birth: 1956-02-13     Patient Gender: M Patient Age:   66 years Exam Location:  [facility] Procedure:      VAS [place] ABI WITH/WO TBI Referring Phys: Collier Salina [place]  --------------------------------------------------------------------------------  Indications: Peripheral artery disease. High Risk Factors: Hypertension, hyperlipidemia, Diabetes.  Comparison Study: No prior studies. Performing Technologist: Carlos Levering RVT  Examination Guidelines: A complete evaluation includes at minimum, Doppler waveform signals and systolic blood pressure reading at the level of bilateral brachial, anterior tibial, and posterior tibial arteries, when vessel segments are accessible. Bilateral testing is considered an integral part of a complete examination. Photoelectric Plethysmograph (PPG) waveforms and toe systolic pressure readings are included as required and additional duplex testing as needed. Limited examinations for reoccurring indications may be performed as noted.  ABI Findings: +---------+------------------+-----+-----------+--------+ Right    Rt Pressure (mmHg)IndexWaveform   Comment  +---------+------------------+-----+-----------+--------+ Brachial 120                    triphasic           +---------+------------------+-----+-----------+--------+ PTA      254               1.97 multiphasic         +---------+------------------+-----+-----------+--------+ DP       254               1.97 multiphasic         +---------+------------------+-----+-----------+--------+ Great Toe98                0.76                     +---------+------------------+-----+-----------+--------+ +---------+------------------+-----+-----------+-------+ Left     Lt Pressure (mmHg)IndexWaveform   Comment +---------+------------------+-----+-----------+-------+ Brachial 129                    triphasic          +---------+------------------+-----+-----------+-------+ PTA      254               1.97 multiphasic        +---------+------------------+-----+-----------+-------+ DP  254               1.97 multiphasic         +---------+------------------+-----+-----------+-------+ Great Toe90                0.70                    +---------+------------------+-----+-----------+-------+ +-------+-----------+-----------+------------+------------+ ABI/TBIToday's ABIToday's TBIPrevious ABIPrevious TBI +-------+-----------+-----------+------------+------------+ Right  [place]         0.76                                +-------+-----------+-----------+------------+------------+ Left   [place]         0.7                                 +-------+-----------+-----------+------------+------------+  Summary: Right: Resting right ankle-brachial index indicates noncompressible right lower extremity arteries. The right toe-brachial index is normal. Left: Resting left ankle-brachial index indicates noncompressible left lower extremity arteries. The left toe-brachial index is normal. *See table(s) above for measurements and observations.  Electronically signed by Harold Barban MD on 10/30/2021 at 8:10:29 PM.    Final    ECHOCARDIOGRAM COMPLETE  Result Date: 10/30/2021    ECHOCARDIOGRAM REPORT   Patient Name:   [name] Date of Exam: 10/30/2021 Medical Rec #:  [id number]    Height:       66.0 in Accession #:    [id number]   Weight:       163.1 lb Date of Birth:  May 12, 1955    BSA:          1.834 m Patient Age:    66 years     BP:           112/89 mmHg Patient Gender: M            HR:           67 bpm. Exam Location:  Inpatient Procedure: 2D Echo, Intracardiac Opacification Agent, Cardiac Doppler and Color            Doppler Indications:    NSTEMI  History:        Patient has prior history of Echocardiogram examinations, most                 recent 09/04/2021. Risk Factors:Hypertension and Diabetes.                 Prostate Cancer ([place]), Anemia, CKD.  Sonographer:    Eartha Inch Referring Phys: [id number] PETER M [place]  Sonographer Comments: Suboptimal parasternal window. Image acquisition challenging due to patient body habitus and  Image acquisition challenging due to respiratory motion. IMPRESSIONS  1. Left ventricular ejection fraction, by estimation, is 70 to 75%. The left ventricle has hyperdynamic function. The left ventricle demonstrates regional wall motion abnormalities (see scoring diagram/findings for description). Left ventricular diastolic parameters are consistent with Grade I diastolic dysfunction (impaired relaxation).  2. Right ventricular systolic function is normal. The right ventricular size is normal. Tricuspid regurgitation signal is inadequate for assessing PA pressure.  3. The mitral valve is grossly normal. Trivial mitral valve regurgitation. No evidence of mitral stenosis.  4. The aortic valve is tricuspid. Aortic valve regurgitation is not visualized. Aortic valve sclerosis is present, with no evidence of aortic valve stenosis.  5. The inferior vena cava is normal in size with  greater than 50% respiratory variability, suggesting right atrial pressure of 3 mmHg. Comparison(s): Changes from prior study are noted. New apical WMA. FINDINGS  Left Ventricle: Left ventricular ejection fraction, by estimation, is 70 to 75%. The left ventricle has hyperdynamic function. The left ventricle demonstrates regional wall motion abnormalities. Definity contrast agent was given IV to delineate the left  ventricular endocardial borders. The left ventricular internal cavity size was normal in size. There is no left ventricular hypertrophy. Left ventricular diastolic parameters are consistent with Grade I diastolic dysfunction (impaired relaxation).  LV Wall Scoring: The apical septal segment and apex are akinetic. Right Ventricle: The right ventricular size is normal. No increase in right ventricular wall thickness. Right ventricular systolic function is normal. Tricuspid regurgitation signal is inadequate for assessing PA pressure. Left Atrium: Left atrial size was normal in size. Right Atrium: Right atrial size was normal in size.  Pericardium: Trivial pericardial effusion is present. Mitral Valve: The mitral valve is grossly normal. Trivial mitral valve regurgitation. No evidence of mitral valve stenosis. MV peak gradient, 4.0 mmHg. The mean mitral valve gradient is 1.0 mmHg. Tricuspid Valve: The tricuspid valve is grossly normal. Tricuspid valve regurgitation is trivial. No evidence of tricuspid stenosis. Aortic Valve: The aortic valve is tricuspid. Aortic valve regurgitation is not visualized. Aortic valve sclerosis is present, with no evidence of aortic valve stenosis. Pulmonic Valve: The pulmonic valve was grossly normal. Pulmonic valve regurgitation is not visualized. No evidence of pulmonic stenosis. Aorta: The aortic root and ascending aorta are structurally normal, with no evidence of dilitation. Venous: The inferior vena cava is normal in size with greater than 50% respiratory variability, suggesting right atrial pressure of 3 mmHg. IAS/Shunts: The atrial septum is grossly normal.  LEFT VENTRICLE PLAX 2D LVIDd:         3.70 cm   Diastology LVIDs:         2.30 cm   LV e' medial:    5.87 cm/s LV PW:         1.20 cm   LV E/e' medial:  9.3 LV IVS:        1.20 cm   LV e' lateral:   7.62 cm/s LVOT diam:     1.80 cm   LV E/e' lateral: 7.1 LV SV:         69 LV SV Index:   38 LVOT Area:     2.54 cm  RIGHT VENTRICLE RV S prime:     10.50 cm/s TAPSE (M-mode): 1.6 cm LEFT ATRIUM             Index        RIGHT ATRIUM           Index LA diam:        3.40 cm 1.85 cm/m   RA Area:     10.40 cm LA Vol (A2C):   29.5 ml 16.09 ml/m  RA Volume:   21.40 ml  11.67 ml/m LA Vol (A4C):   40.7 ml 22.19 ml/m LA Biplane Vol: 34.9 ml 19.03 ml/m  AORTIC VALVE LVOT Vmax:   138.00 cm/s LVOT Vmean:  113.000 cm/s LVOT VTI:    0.273 m  AORTA Ao Root diam: 2.90 cm Ao Asc diam:  3.30 cm MITRAL VALVE MV Area (PHT): 2.21 cm    SHUNTS MV Area VTI:   2.39 cm    Systemic VTI:  0.27 m MV Peak grad:  4.0 mmHg    Systemic Diam: 1.80 cm MV Mean grad:  1.0 mmHg MV Vmax:        1.00 m/s MV Vmean:      56.9 cm/s MV Decel Time: 344 msec MV E velocity: 54.40 cm/s MV A velocity: 85.70 cm/s MV E/A ratio:  0.63 Eleonore Chiquito MD Electronically signed by Eleonore Chiquito MD Signature Date/Time: 10/30/2021/2:24:33 PM    Final    CARDIAC CATHETERIZATION  Result Date: 10/29/2021   Prox RCA lesion is 20% stenosed.   RPDA lesion is 30% stenosed.   1st Mrg lesion is 60% stenosed.   Prox LAD to Mid LAD lesion is 45% stenosed.   1st Diag lesion is 70% stenosed.   Mid LAD lesion is 99% stenosed.   A drug-eluting stent was successfully placed using a SYNERGY XD 2.50X16.   Post intervention, there is a 0% residual stenosis. 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 Normal LVEDP Successful PCI of the mid LAD with DES x 1 Plan: DAPT for one year. Aggressive risk factor modification. Monitor renal function closely with BMET in am. Hydrate. Possible DC tomorrow if stable.    Cardiac Studies   Echocardiogram 10/30/2021   1. Left ventricular ejection fraction, by estimation, is 70 to 75%. The  left ventricle has hyperdynamic function. The left ventricle demonstrates  regional wall motion abnormalities (see scoring diagram/findings for  description). Left ventricular  diastolic parameters are consistent with Grade I diastolic dysfunction  (impaired relaxation).   2. Right ventricular systolic function is normal. The right ventricular  size is normal. Tricuspid regurgitation signal is inadequate for assessing  PA pressure.   3. The mitral valve is grossly normal. Trivial mitral valve  regurgitation. No evidence of mitral stenosis.   4. The aortic valve is tricuspid. Aortic valve regurgitation is not  visualized. Aortic valve sclerosis is present, with no evidence of aortic  valve stenosis.   5. The inferior vena cava is normal in size with greater than 50%  respiratory variability, suggesting right atrial pressure of 3 mmHg.    Comparison(s): Changes from prior study are noted. New apical WMA.     LHC 10/29/2021    Prox RCA lesion is 20% stenosed.   RPDA lesion is 30% stenosed.   1st Mrg lesion is 60% stenosed.   Prox LAD to Mid LAD lesion is 45% stenosed.   1st Diag lesion is 70% stenosed.   Mid LAD lesion is 99% stenosed.   A drug-eluting stent was successfully placed using a SYNERGY XD 2.50X16.   Post intervention, there is a 0% residual stenosis.   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  Normal LVEDP Successful PCI of the mid LAD with DES x 1   Plan: DAPT for one year. Aggressive risk factor modification. Monitor renal function closely with BMET in am. Hydrate. Possible DC tomorrow if stable.    Diagnostic Dominance: Right  Intervention     Patient Profile     66 y.o. male with a past medical history of medical noncompliance, HTN, depression, anxiety, anemia, postpolio syndrome, DM, CKD stage III, HLD, prostate cancer, GERD who is being seen for evaluation of NSTEMI   Assessment & Plan    NSTEMI Chest pain - Patient presented complaining of chest pain, abdominal pain, N/V.  - HS troponin 568>>[id number]. EKG did not appear ischemic - He was started on IV heparin, continued to have some intermittent chest pain - Given his history  of CKD, planned to give IV fluids overnight and cath in the morning of 8/15. However, patient continued to have chest pain so underwent LHC on 8/14.  - LHC 8/14 showed signle vessel occlusive CAD with 99% stenosis in the mid LAD. Now S/P DES to LAD  - Echo this admission showed EF 70-75%, some regional wall motion abnormalities, normal RV systolic function  - Patient was given hydration after cath-- renal function stable, creatinine 1.64 today  - Started metoprolol tartrate 12.5 mg BID this admission-- BP and HR tolerating well  - continue ASA, Brilinta -- patient says that he does not like taking  medications. I emphasized how important his DAPT was in keeping his stent open   - Continue lipitor 80 mg daily    Hypertension - He reported being benicar, this is not listed on home medications. We will hold off on ACEi/ARB for now due to renal dysfunction  - Continue amlodipine, metoprolol-- BP is well controlled on this regiment    Hyperlipidemia with LDL goal < 70 - May consider LDL goal lower than 55 given uncontrolled DM - Lipid panel 12/06/2020: Cholesterol, Total 192; HDL 39; LDL Chol Calc (NIH) 115; Triglycerides 218 - Increased lipitor to 80 mg daily this admission-- will need LFTs and lipid panel in 2-3 months   CKD stage III - sCr 1.79 - baseline apepars 1.5-1.7 - gentle hydration after cath-- creatinine 1.64 this AM    DM - A1c 11% - Appreciate primary team's assistance with DM medications    Depression/Anxiety  - Patient feels very anxious about where he will go after discharge. Feels depressed and that he does not have much to look forward to  - I encouraged him that he is on good medications that can help him feel better long term  - Management per primary   For questions or updates, please contact [facility] Please consult [email or web address] for contact info under        Signed, Margie Billet, PA-C  10/31/2021, 9:11 AM    History and all data above reviewed.  Patient examined.  I agree with the findings as above.  Mild nausea last evening.  No chest pain.  The patient exam reveals COR:RRR  ,  Lungs: Clear  ,  Abd: Positive bowel sounds, no rebound no guarding, Ext No edema  .  All available labs, radiology testing, previous records reviewed. Agree with documented assessment and plan.   NSTEMI:  Meds as on MAR.  Discussed need for med adherence.  Change to Toprol XL for ease of administration.    Jeneen Rinks Jacquelina Hewins  12:07 PM  10/31/2021

## 2021-10-31 NOTE — Inpatient Diabetes Management (Signed)
Inpatient Diabetes Program Recommendations  AACE/ADA: New Consensus Statement on Inpatient Glycemic Control (2015)  Target Ranges:  Prepandial:   less than 140 mg/dL      Peak postprandial:   less than 180 mg/dL (1-2 hours)      Critically ill patients:  140 - 180 mg/dL   Lab Results  Component Value Date   GLUCAP 218 (H) 10/31/2021   HGBA1C 11.0 (H) 09/04/2021    Diabetes history: DM2 Outpatient Diabetes medications:  Semglee 20 units QD Novolog 4 units TID Rybelsus 14 mg QD Current orders for Inpatient glycemic control: Semglee 15 units q hs, Novolog 0-15 units tid, 0-5 units hs  Spoke with patient at bedside.  He states he does not take insulin at home nor does he check his blood sugar.  States, "I don't care if my blood sugar goes up".  He does take Rybelsus.  He has not been to his PCP in several months because he was "fired" for missed appointments.    Reviewed patient's current A1c of 11% (average blood glucose of 269 mg/dL) Explained what a A1c is and what it measures. Also reviewed goal A1c with patient, importance of good glucose control @ home, and blood sugar goals.  He states he likes Ice cream and half and half tea.  He reports someone told him he should drink un-sweet tea and maybe he can do that.  Encouraged him to drink non-caloric beverages, modify CHOs and limit ice cream to on occasion.    He states he is legally blind.  When asked how he gets his food he states a friend will drive him to [place].  He lives alone and can occasionally walk to the store.    He would benefit from a voice glucometer and education on insulin pens using number of clicks to administer dose.  At this time he says he will not give himself insulin or check blood sugars.  Does not want education.    Wants to speak with SW about which facility he will discharge to from the hospital.  Notified Ebony Hail, [place].  Will continue to follow while inpatient.  Thank you, Reche Dixon, MSN,  [place] [facility] Coordinator Inpatient Diabetes Program [phone] (team pager from 8a-5p)

## 2021-11-02 ENCOUNTER — Telehealth (HOSPITAL_COMMUNITY): Payer: Self-pay

## 2021-11-02 DIAGNOSIS — H3530 Unspecified macular degeneration: Secondary | ICD-10-CM | POA: Diagnosis not present

## 2021-11-02 DIAGNOSIS — I10 Essential (primary) hypertension: Secondary | ICD-10-CM | POA: Diagnosis not present

## 2021-11-02 DIAGNOSIS — E119 Type 2 diabetes mellitus without complications: Secondary | ICD-10-CM | POA: Diagnosis not present

## 2021-11-02 DIAGNOSIS — I214 Non-ST elevation (NSTEMI) myocardial infarction: Secondary | ICD-10-CM | POA: Diagnosis not present

## 2021-11-02 DIAGNOSIS — N189 Chronic kidney disease, unspecified: Secondary | ICD-10-CM | POA: Diagnosis not present

## 2021-11-02 DIAGNOSIS — E785 Hyperlipidemia, unspecified: Secondary | ICD-10-CM | POA: Diagnosis not present

## 2021-11-02 DIAGNOSIS — K219 Gastro-esophageal reflux disease without esophagitis: Secondary | ICD-10-CM | POA: Diagnosis not present

## 2021-11-02 NOTE — Telephone Encounter (Signed)
Pt insurance is active and benefits verified through [facility] Medicare Co-pay 0, DED 0/0 met, out of pocket $8,300/$3,918.38 met, co-insurance 20%. no pre-authorization required. Passport, 11/02/2021@9 :30am, REF# [id number]  How many CR sessions are covered? (72 sessions for ICR)72 Is this a lifetime maximum or an annual maximum? annual Has the member used any of these services to date? no Is there a time limit (weeks/months) on start of program and/or program completion? no  2ndary insurance is active and benefits verified through Medicaid. Co-pay 0, DED 0/0 met, out of pocket 0/0 met, co-insurance 0%. No pre-authorization required. Passport, 11/02/2021@9 :43am, REF# [id number]  Will contact patient to see if he is interested in the Cardiac Rehab Program. If interested, patient will need to complete follow up appt. Once completed, patient will be contacted for scheduling upon review by the RN Navigator.

## 2021-11-03 DIAGNOSIS — E785 Hyperlipidemia, unspecified: Secondary | ICD-10-CM | POA: Diagnosis not present

## 2021-11-03 DIAGNOSIS — R000 Tachycardia, unspecified: Secondary | ICD-10-CM | POA: Diagnosis not present

## 2021-11-03 DIAGNOSIS — N390 Urinary tract infection, site not specified: Secondary | ICD-10-CM | POA: Diagnosis not present

## 2021-11-03 DIAGNOSIS — E1121 Type 2 diabetes mellitus with diabetic nephropathy: Secondary | ICD-10-CM | POA: Diagnosis not present

## 2021-11-07 ENCOUNTER — Encounter (HOSPITAL_COMMUNITY)
Admission: EM | Disposition: A | Discharge status: Skilled Nursing Facility | Payer: Self-pay | Source: Home / Self Care | Attending: Emergency Medicine

## 2021-11-07 ENCOUNTER — Observation Stay (HOSPITAL_COMMUNITY)
Admission: EM | Admit: 2021-11-07 | Discharge: 2021-11-08 | Disposition: A | Discharge status: Skilled Nursing Facility | Payer: Medicare Other | Attending: Emergency Medicine | Admitting: Emergency Medicine

## 2021-11-07 ENCOUNTER — Emergency Department (HOSPITAL_COMMUNITY): Payer: Medicare Other

## 2021-11-07 ENCOUNTER — Other Ambulatory Visit: Payer: Self-pay

## 2021-11-07 DIAGNOSIS — R2681 Unsteadiness on feet: Secondary | ICD-10-CM | POA: Diagnosis not present

## 2021-11-07 DIAGNOSIS — R079 Chest pain, unspecified: Secondary | ICD-10-CM | POA: Diagnosis not present

## 2021-11-07 DIAGNOSIS — I2510 Atherosclerotic heart disease of native coronary artery without angina pectoris: Secondary | ICD-10-CM | POA: Diagnosis not present

## 2021-11-07 DIAGNOSIS — R531 Weakness: Secondary | ICD-10-CM | POA: Insufficient documentation

## 2021-11-07 DIAGNOSIS — R6889 Other general symptoms and signs: Secondary | ICD-10-CM | POA: Diagnosis not present

## 2021-11-07 DIAGNOSIS — R0789 Other chest pain: Secondary | ICD-10-CM | POA: Diagnosis not present

## 2021-11-07 DIAGNOSIS — E1122 Type 2 diabetes mellitus with diabetic chronic kidney disease: Secondary | ICD-10-CM | POA: Diagnosis not present

## 2021-11-07 DIAGNOSIS — N1831 Chronic kidney disease, stage 3a: Secondary | ICD-10-CM | POA: Diagnosis not present

## 2021-11-07 DIAGNOSIS — I25110 Atherosclerotic heart disease of native coronary artery with unstable angina pectoris: Secondary | ICD-10-CM | POA: Diagnosis not present

## 2021-11-07 DIAGNOSIS — I129 Hypertensive chronic kidney disease with stage 1 through stage 4 chronic kidney disease, or unspecified chronic kidney disease: Secondary | ICD-10-CM | POA: Insufficient documentation

## 2021-11-07 DIAGNOSIS — Z743 Need for continuous supervision: Secondary | ICD-10-CM | POA: Diagnosis not present

## 2021-11-07 DIAGNOSIS — Z794 Long term (current) use of insulin: Secondary | ICD-10-CM | POA: Diagnosis not present

## 2021-11-07 DIAGNOSIS — Z79899 Other long term (current) drug therapy: Secondary | ICD-10-CM | POA: Diagnosis not present

## 2021-11-07 DIAGNOSIS — Z955 Presence of coronary angioplasty implant and graft: Secondary | ICD-10-CM | POA: Insufficient documentation

## 2021-11-07 DIAGNOSIS — I200 Unstable angina: Secondary | ICD-10-CM | POA: Diagnosis not present

## 2021-11-07 DIAGNOSIS — I252 Old myocardial infarction: Secondary | ICD-10-CM | POA: Diagnosis not present

## 2021-11-07 DIAGNOSIS — Z7982 Long term (current) use of aspirin: Secondary | ICD-10-CM | POA: Insufficient documentation

## 2021-11-07 HISTORY — PX: LEFT HEART CATH AND CORONARY ANGIOGRAPHY: CATH118249

## 2021-11-07 HISTORY — PX: INTRAVASCULAR PRESSURE WIRE/FFR STUDY: CATH118243

## 2021-11-07 LAB — CBC WITH DIFFERENTIAL/PLATELET
Abs Immature Granulocytes: 0.05 10*3/uL (ref 0.00–0.07)
Basophils Absolute: 0 10*3/uL (ref 0.0–0.1)
Basophils Relative: 1 %
Eosinophils Absolute: 0.2 10*3/uL (ref 0.0–0.5)
Eosinophils Relative: 3 %
HCT: 49.4 % (ref 39.0–52.0)
Hemoglobin: 16.3 g/dL (ref 13.0–17.0)
Immature Granulocytes: 1 %
Lymphocytes Relative: 17 %
Lymphs Abs: 1.5 10*3/uL (ref 0.7–4.0)
MCH: 29 pg (ref 26.0–34.0)
MCHC: 33 g/dL (ref 30.0–36.0)
MCV: 87.7 fL (ref 80.0–100.0)
Monocytes Absolute: 0.8 10*3/uL (ref 0.1–1.0)
Monocytes Relative: 9 %
Neutro Abs: 5.9 10*3/uL (ref 1.7–7.7)
Neutrophils Relative %: 69 %
Platelets: 229 10*3/uL (ref 150–400)
RBC: 5.63 MIL/uL (ref 4.22–5.81)
RDW: 13.2 % (ref 11.5–15.5)
WBC: 8.5 10*3/uL (ref 4.0–10.5)
nRBC: 0 % (ref 0.0–0.2)

## 2021-11-07 LAB — GLUCOSE, CAPILLARY
Glucose-Capillary: 120 mg/dL — ABNORMAL HIGH (ref 70–99)
Glucose-Capillary: 209 mg/dL — ABNORMAL HIGH (ref 70–99)

## 2021-11-07 LAB — BASIC METABOLIC PANEL
Anion gap: 9 (ref 5–15)
BUN: 23 mg/dL (ref 8–23)
CO2: 24 mmol/L (ref 22–32)
Calcium: 9.3 mg/dL (ref 8.9–10.3)
Chloride: 108 mmol/L (ref 98–111)
Creatinine, Ser: 1.57 mg/dL — ABNORMAL HIGH (ref 0.61–1.24)
GFR, Estimated: 48 mL/min — ABNORMAL LOW (ref >60)
Glucose, Bld: 155 mg/dL — ABNORMAL HIGH (ref 70–99)
Potassium: 3.9 mmol/L (ref 3.5–5.1)
Sodium: 141 mmol/L (ref 135–145)

## 2021-11-07 LAB — HEPATIC FUNCTION PANEL
ALT: 29 U/L (ref 0–44)
AST: 19 U/L (ref 15–41)
Albumin: 3.3 g/dL — ABNORMAL LOW (ref 3.5–5.0)
Alkaline Phosphatase: 121 U/L (ref 38–126)
Bilirubin, Direct: 0.1 mg/dL (ref 0.0–0.2)
Indirect Bilirubin: 0.6 mg/dL (ref 0.3–0.9)
Total Bilirubin: 0.7 mg/dL (ref 0.3–1.2)
Total Protein: 6.4 g/dL — ABNORMAL LOW (ref 6.5–8.1)

## 2021-11-07 LAB — TROPONIN I (HIGH SENSITIVITY)
Troponin I (High Sensitivity): 51 ng/L — ABNORMAL HIGH (ref <18)
Troponin I (High Sensitivity): 42 ng/L — ABNORMAL HIGH (ref <18)

## 2021-11-07 LAB — POCT ACTIVATED CLOTTING TIME: Activated Clotting Time: 389 seconds

## 2021-11-07 LAB — LIPASE, BLOOD: Lipase: 61 U/L — ABNORMAL HIGH (ref 11–51)

## 2021-11-07 SURGERY — LEFT HEART CATH AND CORONARY ANGIOGRAPHY
Anesthesia: LOCAL

## 2021-11-07 MED ORDER — FENTANYL CITRATE (PF) 100 MCG/2ML IJ SOLN
INTRAMUSCULAR | Status: DC | PRN
  Administered 2021-11-07 (×2): 25 ug via INTRAVENOUS
  Administered 2021-11-07: 50 ug via INTRAVENOUS
  Administered 2021-11-07: 25 ug via INTRAVENOUS

## 2021-11-07 MED ORDER — SODIUM CHLORIDE 0.9 % IV SOLN
INTRAVENOUS | Status: AC
Start: 2021-11-07 — End: 2021-11-07

## 2021-11-07 MED ORDER — HEPARIN (PORCINE) IN NACL 1000-0.9 UT/500ML-% IV SOLN
INTRAVENOUS | Status: DC | PRN
  Administered 2021-11-07 (×2): 500 mL

## 2021-11-07 MED ORDER — LABETALOL HCL 5 MG/ML IV SOLN: 10.0000 mg | INTRAVENOUS | Status: AC | PRN

## 2021-11-07 MED ORDER — FENTANYL CITRATE (PF) 100 MCG/2ML IJ SOLN
INTRAMUSCULAR | Status: AC
  Filled 2021-11-07: qty 2

## 2021-11-07 MED ORDER — MIDAZOLAM HCL 2 MG/2ML IJ SOLN
INTRAMUSCULAR | Status: DC | PRN
  Administered 2021-11-07 (×4): 1 mg via INTRAVENOUS

## 2021-11-07 MED ORDER — HEPARIN SODIUM (PORCINE) 1000 UNIT/ML IJ SOLN
INTRAMUSCULAR | Status: AC
  Filled 2021-11-07: qty 10

## 2021-11-07 MED ORDER — SODIUM CHLORIDE 0.9 % IV SOLN: INTRAVENOUS | Status: AC

## 2021-11-07 MED ORDER — ACETAMINOPHEN 325 MG PO TABS: 650.0000 mg | ORAL_TABLET | ORAL | Status: DC | PRN

## 2021-11-07 MED ORDER — VERAPAMIL HCL 2.5 MG/ML IV SOLN
INTRAVENOUS | Status: AC
  Filled 2021-11-07: qty 2

## 2021-11-07 MED ORDER — ASPIRIN 81 MG PO CHEW
81.0000 mg | CHEWABLE_TABLET | Freq: Every day | ORAL | Status: DC
Start: 2021-11-08 — End: 2021-11-09
  Administered 2021-11-08: 81 mg via ORAL
  Filled 2021-11-07: qty 1

## 2021-11-07 MED ORDER — ONDANSETRON HCL 4 MG/2ML IJ SOLN: 4.0000 mg | Freq: Four times a day (QID) | INTRAMUSCULAR | Status: DC | PRN

## 2021-11-07 MED ORDER — SODIUM CHLORIDE 0.9 % IV SOLN: 250.0000 mL | INTRAVENOUS | Status: DC | PRN

## 2021-11-07 MED ORDER — INSULIN ASPART 100 UNIT/ML IJ SOLN
0.0000 [IU] | Freq: Three times a day (TID) | INTRAMUSCULAR | Status: DC
  Administered 2021-11-08: 5 [IU] via SUBCUTANEOUS
  Administered 2021-11-08: 3 [IU] via SUBCUTANEOUS

## 2021-11-07 MED ORDER — MIDAZOLAM HCL 2 MG/2ML IJ SOLN
INTRAMUSCULAR | Status: AC
  Filled 2021-11-07: qty 2

## 2021-11-07 MED ORDER — SODIUM CHLORIDE 0.9 % IV BOLUS
INTRAVENOUS | Status: AC | PRN
  Administered 2021-11-07: 500 mL via INTRAVENOUS

## 2021-11-07 MED ORDER — VERAPAMIL HCL 2.5 MG/ML IV SOLN
INTRAVENOUS | Status: DC | PRN
  Administered 2021-11-07: 10 mL via INTRA_ARTERIAL

## 2021-11-07 MED ORDER — PANTOPRAZOLE SODIUM 40 MG IV SOLR
40.0000 mg | Freq: Two times a day (BID) | INTRAVENOUS | Status: DC
  Administered 2021-11-07 – 2021-11-08 (×2): 40 mg via INTRAVENOUS
  Filled 2021-11-07 (×2): qty 10

## 2021-11-07 MED ORDER — IOHEXOL 350 MG/ML SOLN
INTRAVENOUS | Status: DC | PRN
  Administered 2021-11-07: 75 mL

## 2021-11-07 MED ORDER — LIDOCAINE HCL (PF) 1 % IJ SOLN
INTRAMUSCULAR | Status: DC | PRN
  Administered 2021-11-07: 2 mL

## 2021-11-07 MED ORDER — LIDOCAINE HCL (PF) 1 % IJ SOLN
INTRAMUSCULAR | Status: AC
  Filled 2021-11-07: qty 30

## 2021-11-07 MED ORDER — HEPARIN SODIUM (PORCINE) 1000 UNIT/ML IJ SOLN
INTRAMUSCULAR | Status: DC | PRN
  Administered 2021-11-07: 5000 [IU] via INTRAVENOUS
  Administered 2021-11-07: 4000 [IU] via INTRAVENOUS

## 2021-11-07 MED ORDER — ASPIRIN 81 MG PO CHEW: 81.0000 mg | CHEWABLE_TABLET | ORAL | Status: DC

## 2021-11-07 MED ORDER — HYDRALAZINE HCL 20 MG/ML IJ SOLN: 10.0000 mg | INTRAMUSCULAR | Status: AC | PRN

## 2021-11-07 MED ORDER — INSULIN GLARGINE-YFGN 100 UNIT/ML ~~LOC~~ SOLN
10.0000 [IU] | Freq: Every day | SUBCUTANEOUS | Status: DC
  Administered 2021-11-07: 10 [IU] via SUBCUTANEOUS
  Filled 2021-11-07 (×2): qty 0.1

## 2021-11-07 MED ORDER — SODIUM CHLORIDE 0.9 % IV SOLN: INTRAVENOUS | Status: DC

## 2021-11-07 MED ORDER — SODIUM CHLORIDE 0.9% FLUSH: 3.0000 mL | INTRAVENOUS | Status: DC | PRN

## 2021-11-07 MED ORDER — TICAGRELOR 90 MG PO TABS
90.0000 mg | ORAL_TABLET | Freq: Two times a day (BID) | ORAL | Status: DC
  Administered 2021-11-07 – 2021-11-08 (×2): 90 mg via ORAL
  Filled 2021-11-07 (×2): qty 1

## 2021-11-07 MED ORDER — SODIUM CHLORIDE 0.9% FLUSH
3.0000 mL | Freq: Two times a day (BID) | INTRAVENOUS | Status: DC
  Administered 2021-11-07 – 2021-11-08 (×2): 3 mL via INTRAVENOUS

## 2021-11-07 SURGICAL SUPPLY — 16 items
CATH DIAG 6FR JR4 (CATHETERS) IMPLANT
CATH INFINITI 6F FL3.5 (CATHETERS) IMPLANT
CATH LAUNCHER 6FR EBU3.5 (CATHETERS) IMPLANT
CATH LAUNCHER 6FR JR4 (CATHETERS) IMPLANT
DEVICE RAD COMP TR BAND LRG (VASCULAR PRODUCTS) IMPLANT
GLIDESHEATH SLEND SS 6F .021 (SHEATH) IMPLANT
GUIDEWIRE PRESSURE X 175 (WIRE) IMPLANT
KIT HEART LEFT (KITS) ×2 IMPLANT
PACK CARDIAC CATHETERIZATION (CUSTOM PROCEDURE TRAY) ×2 IMPLANT
SYR MEDRAD MARK 7 150ML (SYRINGE) ×2 IMPLANT
TRANSDUCER W/STOPCOCK (MISCELLANEOUS) ×2 IMPLANT
TUBING CIL FLEX 10 FLL-RA (TUBING) ×2 IMPLANT
VALVE GUARDIAN II ~~LOC~~ HEMO (MISCELLANEOUS) IMPLANT
WIRE ASAHI PROWATER 180CM (WIRE) IMPLANT
WIRE EMERALD 3MM-J .035X150CM (WIRE) IMPLANT
WIRE EMERALD 3MM-J .035X260CM (WIRE) IMPLANT

## 2021-11-07 NOTE — H&P (Cosign Needed)
Date: 11/07/2021               Patient Name:  [name] MRN: [id number]  DOB: 27-Oct-1955 Age / Sex: 66 y.o., male   PCP: Dr. Marylen Ponto         Medical Service: Internal Medicine Teaching Service         Attending Physician: Dr. Jimmye Norman, Elaina Pattee, MD    First Contact: Drucie Opitz, MD      Pager: [place] [phone]      Second Contact: Sanjuan Dame, MD      Pager: Rudean Curt [phone]           After Hours (After 5p/  First Contact Pager: [phone]  weekends / holidays): Second Contact Pager: [phone]   SUBJECTIVE   Chief Complaint: Chest Pain  History of Present Illness:   [name] is a 66 year old with PMH of Type 2 DM with diabetic nephropathy, essential hypertension, CKD Stage 3, who presented to the emergency department today complaining of chest pain. Of note, patient had a recent admission of NSTEMI and was admitted on 8/14. During that hospital visit, pt underwent a cath procedure where his mid LAD was 99% stenosed and a cath was placed.   Today, he complains of chest pain in the center of his chest. He states pain is in the same location as last time, but does not feel as intense. He described it as stabbing, dull, and burning pain. It started a couple days after he left the hospital, and is intermittent in nature, lasting about 10 seconds. This has occurred about 2-3 times a day. He denies any exacerbating factors, as this happens at rest and exertion. The last instance was this morning at 2 AM. He had one episode of emesis today. He states he is stressed most of the time. He denies any fever, shortness of breath. He has been compliant with his medications.   ED Course: Pt underwent an EKG which showed abnormal changes in the anterior leads compared to his last EKG on visit. Troponins were 51. Cardiology was consulted for further workup, and IMTS was consulted for admission.  Meds:  Current Meds  Medication Sig   amLODipine (NORVASC) 5 MG tablet Take 1  tablet (5 mg total) by mouth daily.   aspirin EC 81 MG tablet Take 1 tablet (81 mg total) by mouth daily. Swallow whole.   atorvastatin (LIPITOR) 80 MG tablet Take 1 tablet (80 mg total) by mouth daily.   finasteride (PROSCAR) 5 MG tablet Take 1 tablet (5 mg total) by mouth daily.   insulin aspart (NOVOLOG) 100 UNIT/ML injection Inject 4 Units into the skin 3 (three) times daily with meals.   insulin glargine-yfgn (SEMGLEE) 100 UNIT/ML injection Inject 0.2 mLs (20 Units total) into the skin daily.   LORazepam (ATIVAN) 0.5 MG tablet Take 0.5 mg by mouth every 8 (eight) hours as needed for anxiety.   metoprolol succinate (TOPROL-XL) 25 MG 24 hr tablet Take 1 tablet (25 mg total) by mouth daily.   sertraline (ZOLOFT) 50 MG tablet Take 1 tablet (50 mg total) by mouth daily.   ticagrelor (BRILINTA) 90 MG TABS tablet Take 1 tablet (90 mg total) by mouth 2 (two) times daily.    Past Medical History  Past Surgical History:  Procedure Laterality Date   CORONARY STENT INTERVENTION N/A 10/29/2021   Procedure: CORONARY STENT INTERVENTION;  Surgeon: Martinique, Peter M, MD;  Location: [facility];  Service: Cardiovascular;  Laterality:  N/A;  Mid LAD   CYSTOSCOPY WITH RETROGRADE PYELOGRAM, URETEROSCOPY AND STENT PLACEMENT Right 07/31/2016   Procedure: CYSTOSCOPY WITH RIGHT  RETROGRADE PYELOGRAM, URETEROSCOPY;  Surgeon: [name], MD;  Location: [facility];  Service: Urology;  Laterality: Right;   INGUINAL HERNIA REPAIR     Left ankle joint fusion  1981   LEFT HEART CATH AND CORONARY ANGIOGRAPHY N/A 10/29/2021   Procedure: LEFT HEART CATH AND CORONARY ANGIOGRAPHY;  Surgeon: Martinique, Peter M, MD;  Location: [facility];  Service: Cardiovascular;  Laterality: N/A;   PROSTATE BIOPSY     x 2   URETERAL REIMPLANTION  07/31/2016   Procedure: URETERAL REIMPLANT, right boari flap, right psoas hitch;  Surgeon: [name], MD;  Location: [facility];  Service: Urology;;    Social:  Lives With:  [facility] Occupation: Retired Support: SNF Level of Function: Able to do all ADLs/IADLs, SNF helps with medication management PCP: Dr. Vista Lawman, [facility], [place] Substances:Denies smoking or alcohol abuse   Allergies: Allergies as of 11/07/2021 - Review Complete 11/07/2021  Allergen Reaction Noted   Haldol [haloperidol] Other (See Comments) 01/06/2021    Review of Systems: A complete ROS was negative except as per HPI.   OBJECTIVE:   Physical Exam: Blood pressure 133/89, pulse (!) 55, temperature (!) 97.5 F (36.4 C), temperature source Oral, resp. rate 15, height '5\' 6"'$  (1.676 m), weight 72.6 kg, SpO2 98 %.  Constitutional: well-appearing male sitting in bed, in no acute distress HENT: normocephalic atraumatic, mucous membranes moist Eyes: conjunctiva non-erythematous Neck: supple Cardiovascular: regular rate and rhythm, no m/r/g Pulmonary/Chest: normal work of breathing on room air, lungs clear to auscultation bilaterally Abdominal: soft, non-tender, non-distended MSK: normal bulk and tone, erythema and dryness on heels  Neurological: alert & oriented x 3, 5/5 strength in bilateral upper and lower extremities, normal gait Skin: warm and dry Psych: Anxious, hx of MDD  Labs: CBC    Component Value Date/Time   WBC 8.5 11/07/2021 0426   RBC 5.63 11/07/2021 0426   HGB 16.3 11/07/2021 0426   HCT 49.4 11/07/2021 0426   PLT 229 11/07/2021 0426   MCV 87.7 11/07/2021 0426   MCH 29.0 11/07/2021 0426   MCHC 33.0 11/07/2021 0426   RDW 13.2 11/07/2021 0426   LYMPHSABS 1.5 11/07/2021 0426   MONOABS 0.8 11/07/2021 0426   EOSABS 0.2 11/07/2021 0426   BASOSABS 0.0 11/07/2021 0426     CMP     Component Value Date/Time   NA 141 11/07/2021 0426   NA 139 12/06/2020 1550   K 3.9 11/07/2021 0426   CL 108 11/07/2021 0426   CO2 24 11/07/2021 0426   GLUCOSE 155 (H) 11/07/2021 0426   BUN 23 11/07/2021 0426   BUN 45 (H) 12/06/2020 1550    CREATININE 1.57 (H) 11/07/2021 0426   CALCIUM 9.3 11/07/2021 0426   PROT 6.4 (L) 11/07/2021 0426   PROT 6.8 07/17/2020 1450   ALBUMIN 3.3 (L) 11/07/2021 0426   ALBUMIN 4.2 07/17/2020 1450   AST 19 11/07/2021 0426   ALT 29 11/07/2021 0426   ALKPHOS 121 11/07/2021 0426   BILITOT 0.7 11/07/2021 0426   BILITOT 0.6 07/17/2020 1450   GFRNONAA 48 (L) 11/07/2021 0426   GFRAA 48 (L) 03/13/2020 1139    Imaging:  CARDIAC CATHETERIZATION  Result Date: 11/07/2021   Prox LAD to Mid LAD lesion is 30% stenosed.   Prox RCA lesion is 20% stenosed.   1st Diag lesion is  70% stenosed.   1st Mrg lesion is 60% stenosed.   RPDA lesion is 40% stenosed.   Non-stenotic Mid LAD lesion was previously treated. 1.  Widely patent mid LAD stent with mild to moderate disease elsewhere. 2.  RFR negative obtuse marginal and PDA lesions. 3.  LVEDP of 5 mmHg. Recommendation: Medical therapy.  Evaluate for noncardiac etiologies of chest pain.   DG Chest 2 View  Result Date: 11/07/2021 CLINICAL DATA:  Chest pain and burning associated with vomiting. EXAM: CHEST - 2 VIEW COMPARISON:  PA Lat 10/29/2021 FINDINGS: The heart size and mediastinal contours are within normal limits. Both lungs are clear. The visualized skeletal structures are intact, with thoracic spondylosis. IMPRESSION: No active cardiopulmonary disease.  Stable chest. Electronically Signed   By: Telford Nab M.D.   On: 11/07/2021 05:24     ASSESSMENT & PLAN:   Assessment & Plan by Problem: Principal Problem:   Atypical chest pain   [name] is a 66 y.o. person living with a history of hypertension, Type 2 Diabetes, CKD3A, hyperlipidemia, prostate cancer, and depression who presented with non-radiating chest pain and admitted for atypical chest pain on hospital day 0  #Atypical Chest Pain  #Hx of CAD S/P PCI on 10/29/21 #Recent NSTEMI #HTN Pt states his pain is in the same location as his previous admission, however states that the intensity is not the  same, it's much less. Pain is also non-radiating. Troponin level obtained in the ED showed initial troponin as 51. EKG showed changes in the anterior leads compared to the EKG taken on his last admission, so cardiology was consulted. Due to patient's history, catherization took place which revealed no severe stenosis of the arteries.   Unlikely anginal pain, as cath was negative, and troponins are currently trending downward. PE unlikely as pt has no dyspnea or acute onset. Due to intermittent nature of symptoms, non-radiating characteristic, and no severe stenosis on cath, unlikely cardiac causes. These symptoms seem to align more with acid reflux/GERD symptoms. Could be reflux symptoms, or GI related. Will explore further.   Plan:  - Appreciate cardiology recommendations  - Continue aspirin, brilinta, and metoprolol - Will start amlodipine if pt gets hypertensive  - Start IV Protonix '40mg'$   #T2DM Pt's previous medical regimen upon admission was SSI, and Semglee 10U once a day. His home regimen was Novolog 4U, and Semglee 20U.   Plan:  - Will start SSI and Semglee 10U  #CKD3A  Pt's GFR was 48, and creatinine was 1.57 upon admission. Baseline creatinine seems to be around 1.4, pt most likely dehydrated. Pt also just underwent cath procedure, so expecting high creatinine levels tomorrow and next day.   Plan:  -IVF   Code: Full    Dispo: Admit patient for less than two midnights  Signed: NooruddinMarlene Lard, MD Internal Medicine Resident PGY-1 Pager: [phone]  11/07/2021, 5:37 PM

## 2021-11-07 NOTE — Progress Notes (Signed)
[facility] called for admission for [name]. He is currently in the cath lab with cardiology. Will plan to see and admit to our service once this is complete. Please call on-call pager at [phone] once patient returns to his room.  Sanjuan Dame, MD Internal Medicine PGY-3 Pager: [phone]

## 2021-11-07 NOTE — ED Provider Notes (Signed)
Care assumed from [place], please see her note for full details, but in brief, [name] is a 66 y.o. male who was recently admitted for an NSTEMI and had a stent placed in the LAD, returns today for chest pain.  Pain started at 230 this morning described as central chest and epigastric pain.  1 episode of vomiting prior to arrival.  Has been compliant with Brilinta since discharge from hospital.  EKG with some new changes in the anterior leads.  Initial troponin is 51, significantly improved from recent hospitalization for NSTEMI.  Prior care team discussed with cardiology and they recommended delta troponin and consult with day team cardiologist.  Plan: Follow up delta trop and re-consult cards   BP (!) 162/89   Pulse (!) 58   Temp 98.6 F (37 C) (Oral)   Resp 14   Ht '5\' 6"'$  (1.676 m)   Wt 72.6 kg   SpO2 99%   BMI 25.82 kg/m    Procedures  Procedures  ED Course / MDM   Labs Reviewed  BASIC METABOLIC PANEL - Abnormal; Notable for the following components:      Result Value   Glucose, Bld 155 (*)    Creatinine, Ser 1.57 (*)    GFR, Estimated 48 (*)    All other components within normal limits  HEPATIC FUNCTION PANEL - Abnormal; Notable for the following components:   Total Protein 6.4 (*)    Albumin 3.3 (*)    All other components within normal limits  LIPASE, BLOOD - Abnormal; Notable for the following components:   Lipase 61 (*)    All other components within normal limits  TROPONIN I (HIGH SENSITIVITY) - Abnormal; Notable for the following components:   Troponin I (High Sensitivity) 51 (*)    All other components within normal limits  TROPONIN I (HIGH SENSITIVITY) - Abnormal; Notable for the following components:   Troponin I (High Sensitivity) 42 (*)    All other components within normal limits  CBC WITH DIFFERENTIAL/PLATELET   EKG Interpretation  Date/Time:  Wednesday November 07 2021 04:52:53 EDT Ventricular Rate:  58 PR Interval:  129 QRS  Duration: 88 QT Interval:  435 QTC Calculation: 428 R Axis:   58 Text Interpretation: Sinus rhythm Abnrm T, consider ischemia, anterolateral lds Confirmed by Quintella Reichert [phone]) on 11/07/2021 6:36:58 AM  DG Chest 2 View  Result Date: 11/07/2021 CLINICAL DATA:  Chest pain and burning associated with vomiting. EXAM: CHEST - 2 VIEW COMPARISON:  PA Lat 10/29/2021 FINDINGS: The heart size and mediastinal contours are within normal limits. Both lungs are clear. The visualized skeletal structures are intact, with thoracic spondylosis. IMPRESSION: No active cardiopulmonary disease.  Stable chest. Electronically Signed   By: Telford Nab M.D.   On: 11/07/2021 05:24    Medical Decision Making Amount and/or Complexity of Data Reviewed Labs: ordered. Radiology: ordered. ECG/medicine tests: ordered.  Risk Decision regarding hospitalization.   6:52 AM called to bedside by nursing staff patient refusing delta troponin and requesting to leave.  I had a long conversation with patient explaining that at this time we cannot rule out new acute cardiac event as he has some EKG changes.  Initial troponin was overall improved compared to recent cardiac event but is still elevated and cardiology recommended second troponin.  Patient is concerned because he is supposed to be in court today I explained to him that he can have a note stating that he was in the emergency department being  evaluated.  After long discussion about potential risks of leaving prior to completing evaluation patient is agreeable to staying and having delta troponin drawn.  8:22 AM delta troponin resulted at 42, this is overall very reassuring as troponins continue to downtrend.  EKG changes may be due to evolution from recent NSTEMI. Discussed with day time cards team, will see pt in consult.  12:15 PM Pt remains stable, awaiting cards consult and recommendations  1:50 PM cardiology gave clearance to cath patient this afternoon and  request medicine admission.  Consult placed to internal medicine teaching service.  2:02 PM Case discussed with Dr. Collene Gobble with internal medicine teaching service who will see and admit the patient.      Jacqlyn Larsen, PA-C 11/07/21 1445    Davonna Belling, MD 11/08/21 [phone]

## 2021-11-07 NOTE — ED Notes (Addendum)
PA at bedside now. Informed pt wants to leave at this time and is refusing delta trop

## 2021-11-07 NOTE — ED Notes (Signed)
Patient transported to X-ray 

## 2021-11-07 NOTE — Hospital Course (Signed)
-   4-5/10 chest pain - center of chest - chest pain does not hurt as much as last hospitalization - pain in same place - does not radiate - stabbing, dull, burning pain at 2 am this morning - 10 second episodes on and off at facility for couple days since he left the hospital - 2-3 times a day - walking, sitting in bed when pain comes on - feels weak and tired all the time - no fever, nausea, vomiting x2, no SOB - has been stressed, frustrated  - compliant with meds - no pain or heaviness in legs - Patient understands why cardio needed to take him to cath lab   #Atypical Chest Pain  #Hx of CAD S/P PCI on 10/29/21 #Recent NSTEMI #HETN Pt presented in the emergency department on 8/23 complaining of chest pain. Given his recent history of CAD S/P PCI on 10/29/21, and EKG changes in the anterior leads, decision was made to admit him to our service. Cardiology was consulted, and patient underwent L sided catherization which revealed no significant stenosis. Troponins upon admission were 51, but may have still been trending downwards from his previous admission. Chest pain is located in the sub-sternal area, and is intermittent in nature. He was started on his home medications of metoprolol '25mg'$ , aspirin '81mg'$ , and his amlodipine was increased to 7.'5mg'$ . He was started on protonix '40mg'$  BID. Cardiology obtained a limited echo which revealed an EF of 60-65%, and consistent with grade 1 diastolic dysfunction. No significant change from previous echo.     On exam today, pt stated he has had intermittent vomiting after meals that has been going on for months. Could be due to poor gastric emptying, as patient has risk factor of T2DM. Obtained a barium esophagus swallow.   #Type 2 Diabetes Mellitus Pt has a history of T2DM. His glucose today was 180, and he was started on semglee 10U and a sliding scale of insulin. Added Wilder Glade to his regimen.   #CKD3A Pt's GFR upon admission was 48, and creatinine was  1.57. Baseline creatinine is around 1.4. Today, GFR is 55, and creatinine is 1.4 after NaCl infusion 146m/hr.

## 2021-11-07 NOTE — ED Notes (Signed)
Patient has pulled off all his leads and pulse ox and is asking when the doctor will come and speak with him. Patient asked to have paperwork faxed over to his lawyer to let him know that he was in the hospital getting treatment as he was supposed to be in court this morning.

## 2021-11-07 NOTE — Consult Note (Addendum)
Cardiology Consultation:   Patient ID: [name] MRN: [id number]; DOB: [birth year]  Admit date: 11/07/2021 Date of Consult: 11/07/2021  PCP:  Merryl Hacker No   [facility] Providers Cardiologist:  Larae Grooms, MD    Patient Profile:   [name] is a 66 y.o. male with a hx of medical noncompliance, hypertension, depression, anxiety, anemia, postpolio syndrome, DM, CKD stage III, hyperlipidemia, prostate cancer, GERD, CAD with recent NSTEMI with DES-mLAD on 10/29/21 who is being seen 11/07/2021 for the evaluation of chest pain at the request of Dr. Alvino Chapel.   History of Present Illness:   [name] has a history of nonischemic nuclear stress test October 2022, obtained for chest pain.  He was hospitalized 09/04/2021 for dizziness.  Telemetry negative for arrhythmia or heart block.  Minimal carotid artery disease on ultrasound.  Dizziness felt noncardiac.  Patient had dizziness while on telemetry with no concomitant arrhythmia.  He did not follow-up with cardiology after either hospitalization.  He has had multiple ER visits with a history of leaving AMA.  He lived in a facility, but was asked to leave just prior to his hospitalization August 2023 for behavioral issues.  He presented to [facility] ED 10/29/2021 with chest pain, abdominal pain, and nausea and vomiting with diarrhea.  Due to ongoing chest pain despite IV nitro and troponin elevation to nearly 11,000, decision was made to obtain definitive angiography.  He underwent heart catheterization 10/29/2021.  Heart catheterization showed 99% mid LAD lesion successfully treated with DES.  Residual disease of 60% in the first marginal and 70% in the first diagonal treated medically.  He was placed on DAPT but continues to have mild chest discomfort.  He was discharged to SNF.  He presented back to [facility] from [facility] with chest pain. He was treated with 324 mg ASA.  He states he has been compliant on brilinta.   EKG with new TWI anterior and  lateral leads HS troponin 51 --> 42 sCr 1.57 Hb 17.9  Maintained on ASA, brilinta, 5 mg amlodipine, 80 mg lipitor, 25 mg toprol.   Cardiology was consulted for medication titration for chest pain. During my interview, he describes chest pain 2-5/10 that has woken him from sleep 4-5 of the last 7 nights. CP generally lats 10-20 seconds and he is able to go back to sleep. This morning the chest pain persisted with a burning sensation. He states it is not reflux, he knows what that feels like. He never received nitro by EMS or EDP. He is chest pain free now.     Past Medical History:  Diagnosis Date   Acute kidney injury superimposed on chronic kidney disease ([place]) 02/10/2020   Acute on chronic kidney failure (HCC) 09/28/2019   Closed nondisplaced spiral fracture of shaft of right tibia    Community acquired pneumonia 58/85/0277   Complicated urinary tract infection 09/06/2016   Depression    Diabetes mellitus without complication (HCC)    GERD (gastroesophageal reflux disease)    Gout    right foot   Hemorrhoids    Hypertension    Macular degeneration    Microcytic anemia    PNA (pneumonia) 03/26/2021   Prostate cancer ([place]) 08/2009   Tubular adenoma     Past Surgical History:  Procedure Laterality Date   CORONARY STENT INTERVENTION N/A 10/29/2021   Procedure: CORONARY STENT INTERVENTION;  Surgeon: Martinique, Peter M, MD;  Location: [place] [facility];  Service: Cardiovascular;  Laterality: N/A;  Mid LAD  CYSTOSCOPY WITH RETROGRADE PYELOGRAM, URETEROSCOPY AND STENT PLACEMENT Right 07/31/2016   Procedure: CYSTOSCOPY WITH RIGHT  RETROGRADE PYELOGRAM, URETEROSCOPY;  Surgeon: [name], MD;  Location: [facility];  Service: Urology;  Laterality: Right;   INGUINAL HERNIA REPAIR     Left ankle joint fusion  1981   LEFT HEART CATH AND CORONARY ANGIOGRAPHY N/A 10/29/2021   Procedure: LEFT HEART CATH AND CORONARY ANGIOGRAPHY;  Surgeon: Martinique, Peter M, MD;  Location: [place] [facility];   Service: Cardiovascular;  Laterality: N/A;   PROSTATE BIOPSY     x 2   URETERAL REIMPLANTION  07/31/2016   Procedure: URETERAL REIMPLANT, right boari flap, right psoas hitch;  Surgeon: [name], MD;  Location: [facility];  Service: Urology;;     Home Medications:  Prior to Admission medications   Medication Sig Start Date End Date Taking? Authorizing Provider  amLODipine (NORVASC) 5 MG tablet Take 1 tablet (5 mg total) by mouth daily. 11/01/21  Yes Nooruddin, Marlene Lard, MD  aspirin EC 81 MG tablet Take 1 tablet (81 mg total) by mouth daily. Swallow whole. 11/01/21  Yes Nooruddin, Marlene Lard, MD  atorvastatin (LIPITOR) 80 MG tablet Take 1 tablet (80 mg total) by mouth daily. 11/01/21  Yes Nooruddin, Marlene Lard, MD  finasteride (PROSCAR) 5 MG tablet Take 1 tablet (5 mg total) by mouth daily. 11/01/21  Yes Nooruddin, Marlene Lard, MD  insulin aspart (NOVOLOG) 100 UNIT/ML injection Inject 4 Units into the skin 3 (three) times daily with meals. 09/11/21  Yes [place] Eric J, DO  insulin glargine-yfgn (SEMGLEE) 100 UNIT/ML injection Inject 0.2 mLs (20 Units total) into the skin daily. 09/12/21  Yes [place] Eric J, DO  LORazepam (ATIVAN) 0.5 MG tablet Take 0.5 mg by mouth every 8 (eight) hours as needed for anxiety. 11/02/21  Yes [provider]  metoprolol succinate (TOPROL-XL) 25 MG 24 hr tablet Take 1 tablet (25 mg total) by mouth daily. 10/31/21  Yes Nooruddin, Marlene Lard, MD  sertraline (ZOLOFT) 50 MG tablet Take 1 tablet (50 mg total) by mouth daily. 09/12/21  Yes [place] Donnamarie Poag, DO  ticagrelor (BRILINTA) 90 MG TABS tablet Take 1 tablet (90 mg total) by mouth 2 (two) times daily. 10/31/21  Yes Nooruddin, Marlene Lard, MD    Inpatient Medications: Scheduled Meds:  Continuous Infusions:  PRN Meds:   Allergies:    Allergies  Allergen Reactions   Haldol [Haloperidol] Other (See Comments)    Per family cause life threatening complications to patient Per Pt, it causes "complications with my mouth"    Social History:    Social History   Socioeconomic History   Marital status: Divorced    Spouse name: Not on file   Number of children: 0   Years of education: Not on file   Highest education level: Not on file  Occupational History   Occupation: retired  Tobacco Use   Smoking status: Never   Smokeless tobacco: Never  Vaping Use   Vaping Use: Never used  Substance and Sexual Activity   Alcohol use: Not Currently   Drug use: No   Sexual activity: Yes  Other Topics Concern   Not on file  Social History Narrative   Not on file   Social Determinants of Health   Financial Resource Strain: Low Risk  (12/10/2019)   Overall Financial Resource Strain (CARDIA)    Difficulty of Paying Living Expenses: Not hard at all  Food Insecurity: No Food Insecurity (08/26/2019)   Hunger Vital Sign    Worried About  Running Out of Food in the Last Year: Never true    [place] in the Last Year: Never true  Transportation Needs: No Transportation Needs (08/26/2019)   PRAPARE - Hydrologist (Medical): No    Lack of Transportation (Non-Medical): No  Physical Activity: Insufficiently Active (08/26/2019)   Exercise Vital Sign    Days of Exercise per Week: 3 days    Minutes of Exercise per Session: 10 min  Stress: No Stress Concern Present (08/26/2019)   [place]    Feeling of Stress : Only a little  Social Connections: Not on file  Intimate Partner Violence: Not At Risk (08/25/2018)   Humiliation, Afraid, Rape, and Kick questionnaire    Fear of Current or Ex-Partner: No    Emotionally Abused: No    Physically Abused: No    Sexually Abused: No    Family History:    Family History  Problem Relation Age of Onset   Lung cancer Mother        Deceased   Pancreatic cancer Father    Heart disease Father        Deceased   Throat cancer Brother    Lung cancer Maternal Uncle        nephew   Diabetes Other      ROS:   Please see the history of present illness.   All other ROS reviewed and negative.     Physical Exam/Data:   Vitals:   11/07/21 1000 11/07/21 1015 11/07/21 1030 11/07/21 1115  BP: (!) 157/99 (!) 155/91 (!) 144/91 (!) 140/85  Pulse: (!) 59 (!) 59 (!) 59 (!) 53  Resp: '11 14 14 12  '$ Temp:      TempSrc:      SpO2: 97% 97% 96% 97%  Weight:      Height:       No intake or output data in the 24 hours ending 11/07/21 1157    11/07/2021    4:27 AM 10/31/2021    5:24 AM 10/29/2021    9:25 PM  Last 3 Weights  Weight (lbs) 160 lb 160 lb 4.8 oz 163 lb 2.3 oz  Weight (kg) 72.576 kg 72.712 kg 74 kg     Body mass index is 25.82 kg/m.  General:  Well nourished, well developed, in no acute distress HEENT: normal Neck: no JVD Vascular: No carotid bruits; Distal pulses 2+ bilaterally Cardiac:  normal S1, S2; RRR; no murmur  Lungs:  clear to auscultation bilaterally, no wheezing, rhonchi or rales  Abd: soft, nontender, no hepatomegaly  Ext: no edema Musculoskeletal:  No deformities, BUE and BLE strength normal and equal Skin: warm and dry  Neuro:  CNs 2-12 intact, no focal abnormalities noted Psych:  Normal affect   EKG:  The EKG was personally reviewed and demonstrates:  sinus rhythm with HR 58, ST elevation with TWI anterior-septal leads Telemetry:  Telemetry was personally reviewed and demonstrates:  sinus to sinus bradycardia 50-60s  Relevant CV Studies:  Echo 10/30/21: 1. Left ventricular ejection fraction, by estimation, is 70 to 75%. The  left ventricle has hyperdynamic function. The left ventricle demonstrates  regional wall motion abnormalities (see scoring diagram/findings for  description). Left ventricular  diastolic parameters are consistent with Grade I diastolic dysfunction  (impaired relaxation).   2. Right ventricular systolic function is normal. The right ventricular  size is normal. Tricuspid regurgitation signal is inadequate for assessing  PA  pressure.   3. The  mitral valve is grossly normal. Trivial mitral valve  regurgitation. No evidence of mitral stenosis.   4. The aortic valve is tricuspid. Aortic valve regurgitation is not  visualized. Aortic valve sclerosis is present, with no evidence of aortic  valve stenosis.   5. The inferior vena cava is normal in size with greater than 50%  respiratory variability, suggesting right atrial pressure of 3 mmHg.    Left heart cath 10/29/21:   Prox RCA lesion is 20% stenosed.   RPDA lesion is 30% stenosed.   1st Mrg lesion is 60% stenosed.   Prox LAD to Mid LAD lesion is 45% stenosed.   1st Diag lesion is 70% stenosed.   Mid LAD lesion is 99% stenosed.   A drug-eluting stent was successfully placed using a SYNERGY XD 2.50X16.   Post intervention, there is a 0% residual stenosis.   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  Normal LVEDP Successful PCI of the mid LAD with DES x 1   Plan: DAPT for one year. Aggressive risk factor modification. Monitor renal function closely with BMET in am. Hydrate. Possible DC tomorrow if stable.   Laboratory Data:  High Sensitivity Troponin:   Recent Labs  Lab 10/29/21 0611 10/29/21 1424 10/29/21 1601 11/07/21 0426 11/07/21 0702  TROPONINIHS 3,473* 9,554* 10,903* 51* 42*     Chemistry Recent Labs  Lab 11/07/21 0426  NA 141  K 3.9  CL 108  CO2 24  GLUCOSE 155*  BUN 23  CREATININE 1.57*  CALCIUM 9.3  GFRNONAA 48*  ANIONGAP 9    Recent Labs  Lab 11/07/21 0426  PROT 6.4*  ALBUMIN 3.3*  AST 19  ALT 29  ALKPHOS 121  BILITOT 0.7   Lipids No results for input(s): "CHOL", "TRIG", "HDL", "LABVLDL", "LDLCALC", "CHOLHDL" in the last 168 hours.  Hematology Recent Labs  Lab 11/07/21 0426  WBC 8.5  RBC 5.63  HGB 16.3  HCT 49.4  MCV 87.7  MCH 29.0  MCHC 33.0  RDW 13.2  PLT 229   Thyroid No results for input(s): "TSH", "FREET4" in the last 168 hours.  BNPNo results for input(s):  "BNP", "PROBNP" in the last 168 hours.  DDimer No results for input(s): "DDIMER" in the last 168 hours.   Radiology/Studies:  DG Chest 2 View  Result Date: 11/07/2021 CLINICAL DATA:  Chest pain and burning associated with vomiting. EXAM: CHEST - 2 VIEW COMPARISON:  PA Lat 10/29/2021 FINDINGS: The heart size and mediastinal contours are within normal limits. Both lungs are clear. The visualized skeletal structures are intact, with thoracic spondylosis. IMPRESSION: No active cardiopulmonary disease.  Stable chest. Electronically Signed   By: Telford Nab M.D.   On: 11/07/2021 05:24     Assessment and Plan:   Chest pain - CE low and flat - EKG with new TWI anterior-septal leads - pt has not received nitro - chest pain sounds atypical, not consistent with his angina leading to heart cath last week - but with new EKG changes recommend repeat angiography - I will start gentle IVF   Recent NSTEMI DES-mLAD 10/29/21 Continue ASA and brilinta, continue BB He reports compliance because he is at a facility that gives him his medications   Hypertension Continue BB and amlodipine   Hyperlipidemia with LDL goal < 70 10/29/2021: Cholesterol 149; HDL 28; LDL Cholesterol 94; Triglycerides 137; VLDL 27 Continue 80 mg lipitor Check LFTs and  lipid panel 2-3 months   CKD stage III sCr at baseline Start gentle fluids   DM A1c 11% He was discharged on semglee and novolog Continue these per primary   Recommend admission to medicine service.  Heart catheterization this afternoon.   Risk Assessment/Risk Scores:     TIMI Risk Score for Unstable Angina or Non-ST Elevation MI:   The patient's TIMI risk score is 5, which indicates a 26% risk of all cause mortality, new or recurrent myocardial infarction or need for urgent revascularization in the next 14 days.      For questions or updates, please contact [place] Please consult [email or web address] for contact info under     Signed, Ledora Bottcher, PA  11/07/2021 11:57 AM   ATTENDING ATTESTATION:  After conducting a review of all available clinical information with the care team, interviewing the patient, and performing a physical exam, I agree with the findings and plan described in this note.   GEN: No acute distress.   HEENT:  MMM, no JVD, no scleral icterus Cardiac: RRR, no murmurs, rubs, or gallops.  Respiratory: Clear to auscultation bilaterally. GI: Soft, nontender, non-distended  MS: No edema; No deformity. Neuro:  Nonfocal  Vasc:  +2 radial pulses  Patient is a 66 year old male with a history of hypertension, depression, recent PCI of the LAD with mild to moderate disease of the obtuse marginal, left circumflex, PDA, and proximal LAD, hypertension, and hyperlipidemia who presents with unstable angina.  His troponins are mildly elevated but downtrending when compared with levels last week when he was admitted with an acute coronary syndrome.  His EKG demonstrated new T wave inversions in the anterior lateral leads.  We will refer for coronary angiography study.  He has been completely compliant with his dual antiplatelet therapy.  Further recommendations will be issued after the coronary angiography study has been completed.  Lenna Sciara, MD Pager [phone]

## 2021-11-07 NOTE — ED Provider Notes (Signed)
[facility] EMERGENCY DEPARTMENT Provider Note   CSN: [id number] Arrival date & time: 11/07/21  0424     History  Chief Complaint  Patient presents with   Chest Pain    [name] is a 66 y.o. male.  The history is provided by the patient and medical records.  Chest Pain  66 y.o. M with hx of HTN, CKD, DM2, HLP, depression, hyponatremia, CAD with DES to LAD earlier this month, currently on brilinta, presenting to the ED with chest pain.  States it started around 0230 this morning-- more central/epigastric area, described as burning.  States after since his NSTEMI he is concerned and feels anxious about his heart at times.  Cannot tell me if this feels like prior chest pains.  States he did vomit x1 at home.  No fever, chills, diarrhea, sick contacts.  Reports he has been compliant with his brilinta.  LHC 10/29/21:   Prox RCA lesion is 20% stenosed.   RPDA lesion is 30% stenosed.   1st Mrg lesion is 60% stenosed.   Prox LAD to Mid LAD lesion is 45% stenosed.   1st Diag lesion is 70% stenosed.   Mid LAD lesion is 99% stenosed.   A drug-eluting stent was successfully placed using a SYNERGY XD 2.50X16.   Post intervention, there is a 0% residual stenosis.   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  Normal LVEDP Successful PCI of the mid LAD with DES x 1    Home Medications Prior to Admission medications   Medication Sig Start Date End Date Taking? Authorizing Provider  amLODipine (NORVASC) 5 MG tablet Take 1 tablet (5 mg total) by mouth daily. 11/01/21   Nooruddin, Marlene Lard, MD  aspirin EC 81 MG tablet Take 1 tablet (81 mg total) by mouth daily. Swallow whole. 11/01/21   Nooruddin, Marlene Lard, MD  atorvastatin (LIPITOR) 80 MG tablet Take 1 tablet (80 mg total) by mouth daily. 11/01/21   Nooruddin, Marlene Lard, MD  finasteride (PROSCAR) 5 MG tablet Take 1 tablet (5 mg total) by mouth daily. 11/01/21   Nooruddin, Marlene Lard,  MD  insulin aspart (NOVOLOG) 100 UNIT/ML injection Inject 4 Units into the skin 3 (three) times daily with meals. Patient not taking: Reported on 10/29/2021 09/11/21   [place] Donnamarie Poag, DO  insulin glargine-yfgn (SEMGLEE) 100 UNIT/ML injection Inject 0.2 mLs (20 Units total) into the skin daily. Patient not taking: Reported on 10/29/2021 09/12/21   [place] Donnamarie Poag, DO  metoprolol succinate (TOPROL-XL) 25 MG 24 hr tablet Take 1 tablet (25 mg total) by mouth daily. 10/31/21   Nooruddin, Marlene Lard, MD  sertraline (ZOLOFT) 50 MG tablet Take 1 tablet (50 mg total) by mouth daily. Patient not taking: Reported on 10/29/2021 09/12/21   [place] Donnamarie Poag, DO  ticagrelor (BRILINTA) 90 MG TABS tablet Take 1 tablet (90 mg total) by mouth 2 (two) times daily. 10/31/21   Nooruddin, Marlene Lard, MD      Allergies    Haldol [haloperidol]    Review of Systems   Review of Systems  Cardiovascular:  Positive for chest pain.  All other systems reviewed and are negative.   Physical Exam Updated Vital Signs BP (!) 162/89   Pulse (!) 58   Temp 98.6 F (37 C) (Oral)   Resp 14   Ht '5\' 6"'$  (1.676 m)   Wt 72.6 kg   SpO2 99%   BMI 25.82 kg/m   Physical Exam  Vitals and nursing note reviewed.  Constitutional:      Appearance: He is well-developed.  HENT:     Head: Normocephalic and atraumatic.  Eyes:     Conjunctiva/sclera: Conjunctivae normal.     Pupils: Pupils are equal, round, and reactive to light.  Cardiovascular:     Rate and Rhythm: Normal rate and regular rhythm.     Heart sounds: Normal heart sounds.  Pulmonary:     Effort: Pulmonary effort is normal.     Breath sounds: Normal breath sounds.  Abdominal:     General: Bowel sounds are normal.     Palpations: Abdomen is soft.     Tenderness: There is no abdominal tenderness. There is no rebound.     Comments: Soft, non-tender on exam  Musculoskeletal:        General: Normal range of motion.     Cervical back: Normal range of motion.  Skin:    General: Skin  is warm and dry.  Neurological:     Mental Status: He is alert and oriented to person, place, and time.  Psychiatric:     Comments: Seems a bit anxious on exam     ED Results / Procedures / Treatments   Labs (all labs ordered are listed, but only abnormal results are displayed) Labs Reviewed  BASIC METABOLIC PANEL - Abnormal; Notable for the following components:      Result Value   Glucose, Bld 155 (*)    Creatinine, Ser 1.57 (*)    GFR, Estimated 48 (*)    All other components within normal limits  HEPATIC FUNCTION PANEL - Abnormal; Notable for the following components:   Total Protein 6.4 (*)    Albumin 3.3 (*)    All other components within normal limits  LIPASE, BLOOD - Abnormal; Notable for the following components:   Lipase 61 (*)    All other components within normal limits  TROPONIN I (HIGH SENSITIVITY) - Abnormal; Notable for the following components:   Troponin I (High Sensitivity) 51 (*)    All other components within normal limits  CBC WITH DIFFERENTIAL/PLATELET  TROPONIN I (HIGH SENSITIVITY)    EKG None  Radiology DG Chest 2 View  Result Date: 11/07/2021 CLINICAL DATA:  Chest pain and burning associated with vomiting. EXAM: CHEST - 2 VIEW COMPARISON:  PA Lat 10/29/2021 FINDINGS: The heart size and mediastinal contours are within normal limits. Both lungs are clear. The visualized skeletal structures are intact, with thoracic spondylosis. IMPRESSION: No active cardiopulmonary disease.  Stable chest. Electronically Signed   By: Telford Nab M.D.   On: 11/07/2021 05:24    Procedures Procedures    Medications Ordered in ED Medications - No data to display  ED Course/ Medical Decision Making/ A&P                           Medical Decision Making Amount and/or Complexity of Data Reviewed Labs: ordered. Radiology: ordered and independent interpretation performed. ECG/medicine tests: ordered and independent interpretation performed.   66 year old male  presenting to the ED with central/epigastric pain that began at 02 30 this morning.  NSTEMI earlier this month, cath 10/29/21 with DES to LAD.  Unclear if symptoms today feeling like prior.  EKG today with changes noted compared with prior-- elevations in V2-V4.  Will check labs, CXR.  ASA given en route, reports compliance with his brilinta.  Labs as above--  no leukocytosis, renal function appears at baseline.  Trop 51.  LFT"s WNL.  Lipase 61.  CXR without acute findings.  Troponin 51 (prior was around 10900 earlier this month) so may still be down-trending.  Will discuss with cardiology given EKG changes.  5:47 AM Discussed with cardiology Fellow, Dr. Marcelle Smiling-- has reviewed EKG's, unsure if acutely new or still just evolutionary changes from prior NSTEMI.  Given trop only 51, plan for repeat and discuss with daytime cards team afterwards for disposition.  6:30 AMd Delta trop pending.  Care will be signed out to oncoming provider to follow-up on this and discuss with daytime cards team for further recommendations.  Final Clinical Impression(s) / ED Diagnoses Final diagnoses:  Chest pain in adult    Rx / DC Orders ED Discharge Orders     None         Larene Pickett, PA-C 11/07/21 [id number]    Quintella Reichert, MD 11/16/21 1220

## 2021-11-07 NOTE — ED Triage Notes (Signed)
Pt BIB [facility] from [facility] d/t pt complaints of chest pain. Pt c/o burning like epigastric/chest pain that started at 2:30 pm associated with emesis. Unremarkable EKG. 324 ASA in route. Cardiac Cath earlier this month.

## 2021-11-08 ENCOUNTER — Inpatient Hospital Stay (HOSPITAL_BASED_OUTPATIENT_CLINIC_OR_DEPARTMENT_OTHER): Payer: Medicare Other

## 2021-11-08 ENCOUNTER — Telehealth (HOSPITAL_COMMUNITY): Payer: Self-pay | Admitting: Pharmacy Technician

## 2021-11-08 ENCOUNTER — Encounter (HOSPITAL_COMMUNITY): Payer: Self-pay | Admitting: Internal Medicine

## 2021-11-08 ENCOUNTER — Other Ambulatory Visit (HOSPITAL_COMMUNITY): Payer: Self-pay

## 2021-11-08 ENCOUNTER — Inpatient Hospital Stay (HOSPITAL_COMMUNITY): Payer: Medicare Other

## 2021-11-08 DIAGNOSIS — R0789 Other chest pain: Secondary | ICD-10-CM | POA: Diagnosis not present

## 2021-11-08 DIAGNOSIS — R531 Weakness: Secondary | ICD-10-CM | POA: Diagnosis not present

## 2021-11-08 DIAGNOSIS — E1122 Type 2 diabetes mellitus with diabetic chronic kidney disease: Secondary | ICD-10-CM | POA: Diagnosis not present

## 2021-11-08 DIAGNOSIS — Z955 Presence of coronary angioplasty implant and graft: Secondary | ICD-10-CM | POA: Diagnosis not present

## 2021-11-08 DIAGNOSIS — R55 Syncope and collapse: Secondary | ICD-10-CM | POA: Diagnosis not present

## 2021-11-08 DIAGNOSIS — Z79899 Other long term (current) drug therapy: Secondary | ICD-10-CM | POA: Diagnosis not present

## 2021-11-08 DIAGNOSIS — R42 Dizziness and giddiness: Secondary | ICD-10-CM | POA: Diagnosis not present

## 2021-11-08 DIAGNOSIS — N1831 Chronic kidney disease, stage 3a: Secondary | ICD-10-CM | POA: Diagnosis not present

## 2021-11-08 DIAGNOSIS — Z794 Long term (current) use of insulin: Secondary | ICD-10-CM

## 2021-11-08 DIAGNOSIS — Z7401 Bed confinement status: Secondary | ICD-10-CM | POA: Diagnosis not present

## 2021-11-08 DIAGNOSIS — E785 Hyperlipidemia, unspecified: Secondary | ICD-10-CM | POA: Diagnosis not present

## 2021-11-08 DIAGNOSIS — Z743 Need for continuous supervision: Secondary | ICD-10-CM | POA: Diagnosis not present

## 2021-11-08 DIAGNOSIS — R079 Chest pain, unspecified: Secondary | ICD-10-CM | POA: Diagnosis not present

## 2021-11-08 DIAGNOSIS — I25119 Atherosclerotic heart disease of native coronary artery with unspecified angina pectoris: Secondary | ICD-10-CM

## 2021-11-08 DIAGNOSIS — I252 Old myocardial infarction: Secondary | ICD-10-CM

## 2021-11-08 DIAGNOSIS — R2681 Unsteadiness on feet: Secondary | ICD-10-CM | POA: Diagnosis not present

## 2021-11-08 DIAGNOSIS — Z91199 Patient's noncompliance with other medical treatment and regimen due to unspecified reason: Secondary | ICD-10-CM | POA: Diagnosis not present

## 2021-11-08 DIAGNOSIS — K219 Gastro-esophageal reflux disease without esophagitis: Secondary | ICD-10-CM | POA: Diagnosis not present

## 2021-11-08 DIAGNOSIS — I129 Hypertensive chronic kidney disease with stage 1 through stage 4 chronic kidney disease, or unspecified chronic kidney disease: Secondary | ICD-10-CM | POA: Diagnosis not present

## 2021-11-08 DIAGNOSIS — Z8679 Personal history of other diseases of the circulatory system: Secondary | ICD-10-CM

## 2021-11-08 DIAGNOSIS — I10 Essential (primary) hypertension: Secondary | ICD-10-CM | POA: Diagnosis not present

## 2021-11-08 DIAGNOSIS — D751 Secondary polycythemia: Secondary | ICD-10-CM | POA: Diagnosis not present

## 2021-11-08 DIAGNOSIS — N189 Chronic kidney disease, unspecified: Secondary | ICD-10-CM | POA: Diagnosis not present

## 2021-11-08 DIAGNOSIS — R338 Other retention of urine: Secondary | ICD-10-CM | POA: Diagnosis not present

## 2021-11-08 DIAGNOSIS — E119 Type 2 diabetes mellitus without complications: Secondary | ICD-10-CM | POA: Diagnosis not present

## 2021-11-08 DIAGNOSIS — R270 Ataxia, unspecified: Secondary | ICD-10-CM | POA: Diagnosis not present

## 2021-11-08 DIAGNOSIS — F32A Depression, unspecified: Secondary | ICD-10-CM | POA: Diagnosis not present

## 2021-11-08 DIAGNOSIS — U071 COVID-19: Secondary | ICD-10-CM | POA: Diagnosis not present

## 2021-11-08 DIAGNOSIS — I2510 Atherosclerotic heart disease of native coronary artery without angina pectoris: Secondary | ICD-10-CM | POA: Diagnosis not present

## 2021-11-08 DIAGNOSIS — Z7982 Long term (current) use of aspirin: Secondary | ICD-10-CM | POA: Diagnosis not present

## 2021-11-08 LAB — GLUCOSE, CAPILLARY
Glucose-Capillary: 180 mg/dL — ABNORMAL HIGH (ref 70–99)
Glucose-Capillary: 108 mg/dL — ABNORMAL HIGH (ref 70–99)
Glucose-Capillary: 240 mg/dL — ABNORMAL HIGH (ref 70–99)

## 2021-11-08 LAB — BASIC METABOLIC PANEL
Anion gap: 6 (ref 5–15)
BUN: 20 mg/dL (ref 8–23)
CO2: 23 mmol/L (ref 22–32)
Calcium: 8.6 mg/dL — ABNORMAL LOW (ref 8.9–10.3)
Chloride: 109 mmol/L (ref 98–111)
Creatinine, Ser: 1.4 mg/dL — ABNORMAL HIGH (ref 0.61–1.24)
GFR, Estimated: 55 mL/min — ABNORMAL LOW (ref >60)
Glucose, Bld: 140 mg/dL — ABNORMAL HIGH (ref 70–99)
Potassium: 4 mmol/L (ref 3.5–5.1)
Sodium: 138 mmol/L (ref 135–145)

## 2021-11-08 LAB — CBC
HCT: 45.6 % (ref 39.0–52.0)
Hemoglobin: 15.6 g/dL (ref 13.0–17.0)
MCH: 29.3 pg (ref 26.0–34.0)
MCHC: 34.2 g/dL (ref 30.0–36.0)
MCV: 85.7 fL (ref 80.0–100.0)
Platelets: 196 10*3/uL (ref 150–400)
RBC: 5.32 MIL/uL (ref 4.22–5.81)
RDW: 13.2 % (ref 11.5–15.5)
WBC: 8.3 10*3/uL (ref 4.0–10.5)
nRBC: 0 % (ref 0.0–0.2)

## 2021-11-08 LAB — ECHOCARDIOGRAM LIMITED
Area-P 1/2: 2.39 cm2
Calc EF: 64.6 %
Height: 66 in
S' Lateral: 2.6 cm
Single Plane A2C EF: 69.1 %
Single Plane A4C EF: 60.6 %
Weight: 2560 oz

## 2021-11-08 MED ORDER — METOPROLOL SUCCINATE ER 25 MG PO TB24
25.0000 mg | ORAL_TABLET | Freq: Every day | ORAL | Status: DC
  Administered 2021-11-08: 25 mg via ORAL
  Filled 2021-11-08: qty 1

## 2021-11-08 MED ORDER — DAPAGLIFLOZIN PROPANEDIOL 10 MG PO TABS
10.0000 mg | ORAL_TABLET | Freq: Every day | ORAL | Status: DC
  Administered 2021-11-08: 10 mg via ORAL
  Filled 2021-11-08: qty 1

## 2021-11-08 MED ORDER — AMLODIPINE BESYLATE 5 MG PO TABS
7.5000 mg | ORAL_TABLET | Freq: Every day | ORAL | Status: DC
  Administered 2021-11-08: 7.5 mg via ORAL
  Filled 2021-11-08: qty 1

## 2021-11-08 MED ORDER — DAPAGLIFLOZIN PROPANEDIOL 10 MG PO TABS
10.0000 mg | ORAL_TABLET | Freq: Every day | ORAL | 0 refills | Status: DC
  Filled 2021-11-08: qty 30, 30d supply, fill #0

## 2021-11-08 MED ORDER — NITROGLYCERIN 0.4 MG SL SUBL
0.4000 mg | SUBLINGUAL_TABLET | SUBLINGUAL | Status: DC | PRN
Start: 2021-11-08 — End: 2021-11-09

## 2021-11-08 MED ORDER — LORAZEPAM 0.5 MG PO TABS: 0.5000 mg | ORAL_TABLET | Freq: Three times a day (TID) | ORAL | 0 refills | Status: DC | PRN

## 2021-11-08 NOTE — Progress Notes (Signed)
Patient is from [facility] short term. CSW requested PT/OT eval. TOC will continue to follow and assist with patients dc planning needs.

## 2021-11-08 NOTE — Progress Notes (Signed)
Echocardiogram 2D Echocardiogram has been performed.  [name] 11/08/2021, 10:27 AM

## 2021-11-08 NOTE — Discharge Summary (Signed)
Name: [name] MRN: [id number] DOB: 1955-03-22 66 y.o. PCP: Pcp, No  Date of Admission: 11/07/2021  4:24 AM Date of Discharge: 11/08/21 Attending Physician: Angelica Pou, MD  Discharge Diagnosis: 1. Principal Problem:   Atypical chest pain    Discharge Medications: Allergies as of 11/08/2021       Reactions   Haldol [haloperidol] Other (See Comments)   Per family cause life threatening complications to patient Per Pt, it causes "complications with my mouth"        Medication List     TAKE these medications    amLODipine 5 MG tablet Commonly known as: NORVASC Take 1 tablet (5 mg total) by mouth daily.   aspirin EC 81 MG tablet Take 1 tablet (81 mg total) by mouth daily. Swallow whole.   atorvastatin 80 MG tablet Commonly known as: LIPITOR Take 1 tablet (80 mg total) by mouth daily.   dapagliflozin propanediol 10 MG Tabs tablet Commonly known as: FARXIGA Take 1 tablet (10 mg total) by mouth daily. Start taking on: November 09, 2021   finasteride 5 MG tablet Commonly known as: PROSCAR Take 1 tablet (5 mg total) by mouth daily.   insulin aspart 100 UNIT/ML injection Commonly known as: novoLOG Inject 4 Units into the skin 3 (three) times daily with meals.   insulin glargine-yfgn 100 UNIT/ML injection Commonly known as: SEMGLEE Inject 0.2 mLs (20 Units total) into the skin daily.   LORazepam 0.5 MG tablet Commonly known as: ATIVAN Take 0.5 mg by mouth every 8 (eight) hours as needed for anxiety.   metoprolol succinate 25 MG 24 hr tablet Commonly known as: TOPROL-XL Take 1 tablet (25 mg total) by mouth daily.   sertraline 50 MG tablet Commonly known as: ZOLOFT Take 1 tablet (50 mg total) by mouth daily.   ticagrelor 90 MG Tabs tablet Commonly known as: BRILINTA Take 1 tablet (90 mg total) by mouth 2 (two) times daily.        Disposition and follow-up:   [name] was discharged from [facility] in Good  condition.  At the hospital follow up visit please address:  1.  Pt would benefit greatly from swallow study as he is having post-prandial emesis, and GI referral for further workup.   2.  Labs / imaging needed at time of follow-up: Swallow study   3.  Pending labs/ test needing follow-up: N/A  Follow-up Appointments:   Hospital Course by problem list: #Atypical Chest Pain  #Hx of CAD S/P PCI on 10/29/21 #Recent NSTEMI #HETN Pt presented in the emergency department on 8/23 complaining of chest pain. Given his recent history of CAD S/P PCI on 10/29/21, and EKG changes in the anterior leads, decision was made to admit him to our service. Cardiology was consulted, and patient underwent L sided catherization which revealed no significant stenosis. Troponins upon admission were 51, but may have still been trending downwards from his previous admission. Chest pain is located in the sub-sternal area, and is intermittent in nature. He was started on his home medications of metoprolol '25mg'$ , aspirin '81mg'$ , and his amlodipine was increased to 7.'5mg'$ . He was started on protonix '40mg'$  BID. Cardiology obtained a limited echo which revealed an EF of 60-65%, and consistent with grade 1 diastolic dysfunction. No significant change from previous echo.  On exam today, pt stated he has had intermittent vomiting after meals that has been going on for months. Could be due to poor gastric emptying, as patient  has risk factor of T2DM. Recommend barium swallow in outpatient setting.   #Type 2 Diabetes Mellitus Pt has a history of T2DM. His glucose today was 180, and he was started on semglee 10U and a sliding scale of insulin. Added Wilder Glade to his regimen for tighter glucose control, as well as cardiac and renal benefits  #CKD3A Pt's GFR upon admission was 48, and creatinine was 1.57. Baseline creatinine is around 1.4. Today, GFR is 55, and creatinine is 1.4 after NaCl infusion 175m/hr.   Discharge Exam:   BP 125/72  (BP Location: Right Arm)   Pulse 71   Temp 99.1 F (37.3 C) (Oral)   Resp 16   Ht '5\' 6"'$  (1.676 m)   Wt 72.6 kg   SpO2 94%   BMI 25.82 kg/m  Discharge exam:   Constitutional: Well-appearing male, sitting in bed, in no acute distress HENT: Normocephalic, atraumatic, mucous membranes moist Cardiovascular: Regular rate and rhythm, no m/r/g Pulmonary/Chest: Normal work of breathing on room air, lungs clear to auscultation bilaterally  Abdominal: Soft, non-tender, non-distended  Pertinent Labs, Studies, and Procedures:     Latest Ref Rng & Units 11/08/2021    6:29 AM 11/07/2021    4:26 AM 10/30/2021    3:33 AM  CBC  WBC 4.0 - 10.5 K/uL 8.3  8.5  8.7   Hemoglobin 13.0 - 17.0 g/dL 15.6  16.3  17.9   Hematocrit 39.0 - 52.0 % 45.6  49.4  52.8   Platelets 150 - 400 K/uL 196  229  209        Latest Ref Rng & Units 11/08/2021    6:29 AM 11/07/2021    4:26 AM 10/31/2021    4:59 AM  BMP  Glucose 70 - 99 mg/dL 140  155  157   BUN 8 - 23 mg/dL 20  23  42   Creatinine 0.61 - 1.24 mg/dL 1.40  1.57  1.64   Sodium 135 - 145 mmol/L 138  141  137   Potassium 3.5 - 5.1 mmol/L 4.0  3.9  4.1   Chloride 98 - 111 mmol/L 109  108  104   CO2 22 - 32 mmol/L '23  24  23   '$ Calcium 8.9 - 10.3 mg/dL 8.6  9.3  9.5      ECHOCARDIOGRAM LIMITED  Result Date: 11/08/2021    ECHOCARDIOGRAM LIMITED REPORT   Patient Name:   Randy UMLANDDate of Exam: 11/08/2021 Medical Rec #:  [id number]   Height:       66.0 in Accession #:    [id number]  Weight:       160.0 lb Date of Birth:  105-18-1957   BSA:          1.819 m Patient Age:    89years     BP:           136/84 mmHg Patient Gender: M            HR:           70 bpm. Exam Location:  Inpatient Procedure: Limited Echo, Limited Color Doppler and Cardiac Doppler Indications:    R07.9* Chest pain, unspecified  History:        Patient has prior history of Echocardiogram examinations, most                 recent 10/30/2021. Risk Factors:Hypertension, Diabetes and GERD.   Sonographer:    SBernadene PersonRDCS Referring Phys: [id number] Dy  NICOLE DUKE IMPRESSIONS  1. Left ventricular ejection fraction, by estimation, is 60 to 65%. The left ventricle has normal function. Left ventricular diastolic parameters are consistent with Grade I diastolic dysfunction (impaired relaxation).  2. Right ventricular systolic function is normal. The right ventricular size is normal. There is normal pulmonary artery systolic pressure. The estimated right ventricular systolic pressure is 65.7 mmHg.  3. The mitral valve is normal in structure. Trivial mitral valve regurgitation.  4. The aortic valve is tricuspid. There is mild calcification of the aortic valve. There is mild thickening of the aortic valve. Aortic valve sclerosis is present, with no evidence of aortic valve stenosis.  5. The inferior vena cava is normal in size with greater than 50% respiratory variability, suggesting right atrial pressure of 3 mmHg. Comparison(s): Compared to prior TTE on 10/30/21, the apical segments are not well visualized on current study as definity was not used and were previously akinetic. Otherwise, there is no significant change. FINDINGS  Left Ventricle: Left ventricular ejection fraction, by estimation, is 60 to 65%. The left ventricle has normal function. The left ventricular internal cavity size was normal in size. There is no left ventricular hypertrophy. Left ventricular diastolic parameters are consistent with Grade I diastolic dysfunction (impaired relaxation). Right Ventricle: The right ventricular size is normal. No increase in right ventricular wall thickness. Right ventricular systolic function is normal. There is normal pulmonary artery systolic pressure. The tricuspid regurgitant velocity is 1.74 m/s, and  with an assumed right atrial pressure of 3 mmHg, the estimated right ventricular systolic pressure is 84.6 mmHg. Pericardium: There is no evidence of pericardial effusion. Mitral Valve: The mitral  valve is normal in structure. Trivial mitral valve regurgitation. Tricuspid Valve: The tricuspid valve is normal in structure. Tricuspid valve regurgitation is not demonstrated. Aortic Valve: The aortic valve is tricuspid. There is mild calcification of the aortic valve. There is mild thickening of the aortic valve. Aortic valve sclerosis is present, with no evidence of aortic valve stenosis. Pulmonic Valve: The pulmonic valve was not well visualized. Pulmonic valve regurgitation is trivial. Aorta: The aortic root is normal in size and structure. Venous: The inferior vena cava is normal in size with greater than 50% respiratory variability, suggesting right atrial pressure of 3 mmHg. LEFT VENTRICLE PLAX 2D LVIDd:         5.10 cm     Diastology LVIDs:         2.60 cm     LV e' medial:    6.73 cm/s LV PW:         1.00 cm     LV E/e' medial:  10.8 LV IVS:        0.80 cm     LV e' lateral:   8.11 cm/s LVOT diam:     2.00 cm     LV E/e' lateral: 9.0 LV SV:         78 LV SV Index:   43 LVOT Area:     3.14 cm  LV Volumes (MOD) LV vol d, MOD A2C: 54.1 ml LV vol d, MOD A4C: 64.0 ml LV vol s, MOD A2C: 16.7 ml LV vol s, MOD A4C: 25.2 ml LV SV MOD A2C:     37.4 ml LV SV MOD A4C:     64.0 ml LV SV MOD BP:      38.8 ml RIGHT VENTRICLE RV S prime:     13.60 cm/s TAPSE (M-mode): 1.8 cm LEFT ATRIUM  Index LA diam:    4.30 cm 2.36 cm/m  AORTIC VALVE LVOT Vmax:   131.00 cm/s LVOT Vmean:  86.600 cm/s LVOT VTI:    0.249 m  AORTA Ao Root diam: 3.30 cm MITRAL VALVE               TRICUSPID VALVE MV Area (PHT): 2.39 cm    TR Peak grad:   12.1 mmHg MV Decel Time: 317 msec    TR Vmax:        174.00 cm/s MV E velocity: 72.80 cm/s MV A velocity: 90.60 cm/s  SHUNTS MV E/A ratio:  0.80        Systemic VTI:  0.25 m                            Systemic Diam: 2.00 cm Gwyndolyn Kaufman MD Electronically signed by Gwyndolyn Kaufman MD Signature Date/Time: 11/08/2021/11:42:54 AM    Final    CARDIAC CATHETERIZATION  Result Date: 11/07/2021    Prox LAD to Mid LAD lesion is 30% stenosed.   Prox RCA lesion is 20% stenosed.   1st Diag lesion is 70% stenosed.   1st Mrg lesion is 60% stenosed.   RPDA lesion is 40% stenosed.   Non-stenotic Mid LAD lesion was previously treated. 1.  Widely patent mid LAD stent with mild to moderate disease elsewhere. 2.  RFR negative obtuse marginal and PDA lesions. 3.  LVEDP of 5 mmHg. Recommendation: Medical therapy.  Evaluate for noncardiac etiologies of chest pain.   DG Chest 2 View  Result Date: 11/07/2021 CLINICAL DATA:  Chest pain and burning associated with vomiting. EXAM: CHEST - 2 VIEW COMPARISON:  PA Lat 10/29/2021 FINDINGS: The heart size and mediastinal contours are within normal limits. Both lungs are clear. The visualized skeletal structures are intact, with thoracic spondylosis. IMPRESSION: No active cardiopulmonary disease.  Stable chest. Electronically Signed   By: Telford Nab M.D.   On: 11/07/2021 05:24     Discharge Instructions: Discharge Instructions     Diet - low sodium heart healthy   Complete by: As directed    Increase activity slowly   Complete by: As directed        Signed: Drucie Opitz, MD 11/08/2021, 3:13 PM   Pager: [phone]

## 2021-11-08 NOTE — TOC Transition Note (Signed)
Transition of Care [facility]) - CM/SW Discharge Note   Patient Details  Name: [name] MRN: [id number] Date of Birth: [birth year]  Transition of Care [facility]) CM/SW Contact:  Bethann Berkshire, LCSW Phone Number: 11/08/2021, 5:14 PM   Clinical Narrative:     [facility] supervisor spoke with [facility]. They can accept pt back. CSW confirmed this with [facility].   Patient will DC to: [facility] Anticipated DC date: 11/08/21 Transport by: Corey Harold   Per MD patient ready for DC to [facility]. RN, patient, and facility notified of DC. Discharge Summary and FL2 sent to facility. RN to call report prior to discharge [phone](). DC packet on chart. Ambulance transport requested for patient.   CSW will sign off for now as social work intervention is no longer needed. Please consult [place] again if new needs arise.   Final next level of care: [place] Barriers to Discharge: No Barriers Identified   Patient Goals and CMS Choice        Discharge Placement              Patient chooses bed at:  [facility]) Patient to be transferred to facility by: [facility]   Patient and family notified of of transfer: 11/08/21  Discharge Plan and Services                                     Social Determinants of Health (SDOH) Interventions     Readmission Risk Interventions    10/31/2021    2:47 PM 10/05/2019    4:42 PM  Readmission Risk Prevention Plan  Transportation Screening Complete Complete  PCP or Specialist Appt within 5-7 Days  Complete  Home Care Screening  Complete  Medication Review (RN CM)  Complete  Medication Review (RN Care Manager) Complete   PCP or Specialist appointment within 3-5 days of discharge Complete   SW Recovery Care/Counseling Consult Complete   [place] Complete

## 2021-11-08 NOTE — Evaluation (Signed)
Occupational Therapy Evaluation Patient Details Name: [name] MRN: [id number] DOB: [birth year] Today's Date: 11/08/2021   History of Present Illness 66 year old with PMH of Type 2 DM with diabetic nephropathy, essential hypertension, CKD Stage 3, who presented to the emergency department today complaining of chest pain.  Recent hospitalizations for NSTEMI and syncopal epidsodes with associated dizziness.   Clinical Impression   Patient admitted for the diagnosis above. He has been seen by this OT in the past, and presents with poor command following, poor dynamic balance, compounded by visual deficits that impact his ability with medications management.  PTA he lives alone in a two story condo style apartment, and relies on friends for community mobility, groceries, and medication setup.  Unfortunately the individual that assisted with medications has moved, and he is unsure who could help him.  He demonstrates a lack of insight, which places him at an increased fall risk.  The patient exhibited multiple loss's of balance, and needed assist to prevent a fall.  He would benefit from continued rehab to improve dynamic balance, and ultimately, would benefit from either LTC at a local SNF, or ALF that can assist with medications and meals.         Recommendations for follow up therapy are one component of a multi-disciplinary discharge planning process, led by the attending physician.  Recommendations may be updated based on patient status, additional functional criteria and insurance authorization.   Follow Up Recommendations  Skilled nursing-short term rehab (<3 hours/day)    Assistance Recommended at Discharge Frequent or constant Supervision/Assistance  Patient can return home with the following A little help with walking and/or transfers;A little help with bathing/dressing/bathroom;Assistance with cooking/housework;Direct supervision/assist for medications management;Direct supervision/assist  for financial management;Assist for transportation    Functional Status Assessment  Patient has had a recent decline in their functional status and demonstrates the ability to make significant improvements in function in a reasonable and predictable amount of time.  Equipment Recommendations  None recommended by OT    Recommendations for Other Services       Precautions / Restrictions Precautions Precautions: Fall Restrictions Weight Bearing Restrictions: No      Mobility Bed Mobility               General bed mobility comments: sitting edge of bed    Transfers Overall transfer level: Needs assistance Equipment used: None Transfers: Sit to/from Stand Sit to Stand: Min guard                  Balance Overall balance assessment: Needs assistance Sitting-balance support: Feet supported Sitting balance-Leahy Scale: Good     Standing balance support: No upper extremity supported Standing balance-Leahy Scale: Fair Standing balance comment: would benefit from AD for mobility                           ADL either performed or assessed with clinical judgement   ADL       Grooming: Min guard;Standing   Upper Body Bathing: Sitting;Set up   Lower Body Bathing: Sit to/from stand;Minimal assistance   Upper Body Dressing : Minimal assistance;Standing   Lower Body Dressing: Minimal assistance;Sit to/from stand   Toilet Transfer: Minimal assistance;Ambulation                   Vision Baseline Vision/History: 6 Macular Degeneration Ability to See in Adequate Light: 3 Highly impaired Patient Visual Report: No change from baseline  Perception Perception Perception: Not tested   Praxis Praxis Praxis: Not tested    Pertinent Vitals/Pain Pain Assessment Pain Assessment: No/denies pain     Hand Dominance Right   Extremity/Trunk Assessment Upper Extremity Assessment Upper Extremity Assessment: Generalized weakness   Lower  Extremity Assessment Lower Extremity Assessment: Defer to PT evaluation   Cervical / Trunk Assessment Cervical / Trunk Assessment: Kyphotic   Communication Communication Communication: No difficulties   Cognition Arousal/Alertness: Awake/alert Behavior During Therapy: WFL for tasks assessed/performed   Area of Impairment: Attention, Following commands, Awareness, Problem solving                   Current Attention Level: Selective   Following Commands: Follows one step commands inconsistently   Awareness: Emergent Problem Solving: Requires verbal cues, Slow processing       General Comments   Continues with previous dizzy spells    Exercises     Shoulder Instructions      Home Living Family/patient expects to be discharged to:: Skilled nursing facility                                 Additional Comments: Patient stating he lives in a two level condo style apartment.  Stays on the main level.      Prior Functioning/Environment               Mobility Comments: Reports independence, but then reports difficulty with stair navigation. ADLs Comments: Performs ADLs, sink baths on the main level.  No longer drives, difficulty with meds due to poor vision, and has a friend that brings food.        OT Problem List: Decreased strength;Decreased activity tolerance;Impaired balance (sitting and/or standing);Decreased knowledge of use of DME or AE      OT Treatment/Interventions: Self-care/ADL training;Therapeutic exercise;Energy conservation;DME and/or AE instruction;Therapeutic activities;Patient/family education    OT Goals(Current goals can be found in the care plan section) Acute Rehab OT Goals Patient Stated Goal: Continue rehab OT Goal Formulation: With patient Time For Goal Achievement: 11/21/21 Potential to Achieve Goals: Good  OT Frequency: Min 2X/week    Co-evaluation              AM-PAC OT "6 Clicks" Daily Activity      Outcome Measure Help from another person eating meals?: None Help from another person taking care of personal grooming?: A Little Help from another person toileting, which includes using toliet, bedpan, or urinal?: A Little Help from another person bathing (including washing, rinsing, drying)?: A Little Help from another person to put on and taking off regular upper body clothing?: A Little Help from another person to put on and taking off regular lower body clothing?: A Little 6 Click Score: 19   End of Session Equipment Utilized During Treatment: Gait belt Nurse Communication: Mobility status  Activity Tolerance: Patient tolerated treatment well Patient left: in chair;with call bell/phone within reach  OT Visit Diagnosis: Other abnormalities of gait and mobility (R26.89);Unsteadiness on feet (R26.81);Muscle weakness (generalized) (M62.81)                Time: 0175-1025 OT Time Calculation (min): 16 min Charges:  OT General Charges $OT Visit: 1 Visit OT Evaluation $OT Eval Moderate Complexity: 1 Mod  11/08/2021  RP, OTR/L  Acute Rehabilitation Services  Office:  [phone]   Metta Clines 11/08/2021, 2:55 PM

## 2021-11-08 NOTE — TOC Benefit Eligibility Note (Addendum)
Patient Teacher, English as a foreign language completed.    The patient is currently admitted and upon discharge could be taking Farxiga 10 mg.  The current 30 day co-pay is $0.00.   The patient is currently admitted and upon discharge could be taking Jardiance 10 mg.  The current 30 day co-pay is $0.00.   The patient is insured through [place] Patient Advocate Specialist [place] Patient Advocate Team Direct Number: [phone]  Fax: [phone]

## 2021-11-08 NOTE — NC FL2 (Signed)
[place] LEVEL OF CARE SCREENING TOOL     IDENTIFICATION  Patient Name: [name] Birthdate: [birth year] Sex: male Admission Date (Current Location): 11/07/2021  [facility] and [place] Number:  Herbalist and Address:  The [place], [place] [address], [place], [place] [address]      Provider Number: [id number]  Attending Physician Name and Address:  Angelica Pou, MD  Relative Name and Phone Number:  Shona Simpson (Sister)   [phone] [facility])    Current Level of Care: Hospital Recommended Level of Care: [place] Prior Approval Number:    Date Approved/Denied:   PASRR Number: [id number]  Discharge Plan: SNF    Current Diagnoses: Patient Active Problem List   Diagnosis Date Noted   Atypical chest pain 11/07/2021   NSTEMI (non-ST elevated myocardial infarction) ([place]) 10/29/2021   Syncope and collapse 09/02/2021   History of prostate cancer with recurrent urinary obstruction  03/24/2021   Polycythemia 03/24/2021   COVID-19 virus infection 03/24/2021   History of noncompliance with medical treatment 03/24/2021   HLD (hyperlipidemia) 01/05/2021   Acute urinary retention 10/25/2020   Adjustment disorder with mixed anxiety and depressed mood    Ataxia 08/01/2020   Dizziness 07/31/2020   Hydroureteronephrosis 02/10/2020   Nausea and vomiting 02/10/2020   Pneumonia due to COVID-19 virus 09/29/2019   Hyponatremia 09/28/2019   Sinus tachycardia 09/28/2019   Insomnia 08/26/2018   Nephropathy 03/26/2018   Urinary urgency 03/26/2018   Type 2 diabetes mellitus with diabetic nephropathy, without long-term current use of insulin ([place]) 03/26/2018   Essential hypertension 03/26/2018   Chronic kidney disease (CKD), stage III (moderate) ([place]) 03/26/2018   Conductive hearing loss, bilateral 11/07/2017   Microcytic anemia 02/17/2012   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   Benign hypertension 02/17/2012    Depressive disorder 02/17/2012    Orientation RESPIRATION BLADDER Height & Weight     Self, Place, Situation  Normal Incontinent, External catheter Weight: 160 lb (72.6 kg) Height:  '5\' 6"'$  (167.6 cm)  BEHAVIORAL SYMPTOMS/MOOD NEUROLOGICAL BOWEL NUTRITION STATUS      Continent Diet (See d/c summary)  AMBULATORY STATUS COMMUNICATION OF NEEDS Skin   Extensive Assist Verbally Normal                       Personal Care Assistance Level of Assistance  Bathing, Feeding, Dressing Bathing Assistance: Limited assistance Feeding assistance: Independent Dressing Assistance: Limited assistance     Functional Limitations Info  Sight, Hearing, Speech Sight Info: Impaired Hearing Info: Adequate Speech Info: Adequate    SPECIAL CARE FACTORS FREQUENCY                       Contractures Contractures Info: Not present    Additional Factors Info  Code Status, Allergies Code Status Info: Full code Allergies Info: Haldol (haloperidol)           Current Medications (11/08/2021):  This is the current hospital active medication list Current Facility-Administered Medications  Medication Dose Route Frequency Provider Last Rate Last Admin   0.9 %  sodium chloride infusion  250 mL Intravenous PRN Early Osmond, MD       acetaminophen (TYLENOL) tablet 650 mg  650 mg Oral Q4H PRN Early Osmond, MD       amLODipine (NORVASC) tablet 7.5 mg  7.5 mg Oral Daily Ledora Bottcher, PA   7.5 mg at 11/08/21 1036   aspirin chewable tablet  81 mg  81 mg Oral Daily Early Osmond, MD   81 mg at 11/08/21 1036   dapagliflozin propanediol (FARXIGA) tablet 10 mg  10 mg Oral Daily Ledora Bottcher, [place]   10 mg at 11/08/21 1036   insulin aspart (novoLOG) injection 0-15 Units  0-15 Units Subcutaneous TID WC Nooruddin, Saad, MD   3 Units at 11/08/21 0859   insulin glargine-yfgn (SEMGLEE) injection 10 Units  10 Units Subcutaneous QHS Nooruddin, Marlene Lard, MD   10 Units at 11/07/21 2141   metoprolol  succinate (TOPROL-XL) 24 hr tablet 25 mg  25 mg Oral Daily Nooruddin, Saad, MD   25 mg at 11/08/21 1036   nitroGLYCERIN (NITROSTAT) SL tablet 0.4 mg  0.4 mg Sublingual Q5 min PRN Angelica Pou, MD       ondansetron [facility]) injection 4 mg  4 mg Intravenous Q6H PRN Early Osmond, MD       pantoprazole (PROTONIX) injection 40 mg  40 mg Intravenous Q12H Nooruddin, Saad, MD   40 mg at 11/08/21 1034   sodium chloride flush (NS) 0.9 % injection 3 mL  3 mL Intravenous Q12H Early Osmond, MD   3 mL at 11/08/21 1036   sodium chloride flush (NS) 0.9 % injection 3 mL  3 mL Intravenous PRN Early Osmond, MD       ticagrelor [facility]) tablet 90 mg  90 mg Oral BID Early Osmond, MD   90 mg at 11/08/21 1036     Discharge Medications: Please see discharge summary for a list of discharge medications.  Relevant Imaging Results:  Relevant Lab Results:   Additional Information [id number], Both Covid Vaccines  Amjad Fikes [employer], LCSW

## 2021-11-08 NOTE — Care Management CC44 (Signed)
Condition Code 44 Documentation Completed  Patient Details  Name: RHILEY SOLEM MRN: [id number] Date of Birth: 1956/02/20   Condition Code 44 given:  Yes Patient signature on Condition Code 44 notice:  Yes Documentation of 2 MD's agreement:  Yes Code 44 added to claim:  Yes    Ninfa Meeker, RN 11/08/2021, 1:27 PM

## 2021-11-08 NOTE — Care Management Obs Status (Signed)
[place] NOTIFICATION   Patient Details  Name: [name] MRN: [id number] Date of Birth: 1955-12-02   Medicare Observation Status Notification Given:  Yes    Ninfa Meeker, RN 11/08/2021, 1:27 PM

## 2021-11-08 NOTE — Evaluation (Signed)
Physical Therapy Evaluation Patient Details Name: [name] MRN: [id number] DOB: [birth year] Today's Date: 11/08/2021  History of Present Illness  66 year old with PMH of Type 2 DM with diabetic nephropathy, essential hypertension, CKD Stage 3, who presented to the emergency department today complaining of chest pain.  Recent hospitalizations for NSTEMI and syncopal epidsodes with associated dizziness.  Clinical Impression  Pt admitted secondary to problem above with deficits below. Pt unsteady and with notable cognitive deficits. Multiple LOB throughout during gait and in standing, requiring up to mod A. Requiring multiple safety cues throughout to ensure safety with mobility. Feel pt would benefit from continued therapies at SNF to address current deficits. Ultimately, would benefit from either LTC at a local SNF, or ALF that can assist with medications and meals. Will continue to follow acutely.      Recommendations for follow up therapy are one component of a multi-disciplinary discharge planning process, led by the attending physician.  Recommendations may be updated based on patient status, additional functional criteria and insurance authorization.  Follow Up Recommendations Skilled nursing-short term rehab (<3 hours/day) Can patient physically be transported by private vehicle: Yes    Assistance Recommended at Discharge Frequent or constant Supervision/Assistance  Patient can return home with the following  Direct supervision/assist for financial management;Direct supervision/assist for medications management;Assistance with cooking/housework;Help with stairs or ramp for entrance;Assist for transportation;A little help with walking and/or transfers;A little help with bathing/dressing/bathroom    Equipment Recommendations Rolling walker (2 wheels)  Recommendations for Other Services       Functional Status Assessment Patient has had a recent decline in their functional status and  demonstrates the ability to make significant improvements in function in a reasonable and predictable amount of time.     Precautions / Restrictions Precautions Precautions: Fall Restrictions Weight Bearing Restrictions: No      Mobility  Bed Mobility               General bed mobility comments: sitting edge of bed    Transfers Overall transfer level: Needs assistance Equipment used: None Transfers: Sit to/from Stand Sit to Stand: Min guard, Mod assist           General transfer comment: Min guard for safety and steadying. Pt with LOB in standing when cleaning himself and required mod A to prevent fall.    Ambulation/Gait Ambulation/Gait assistance: Min assist, Min guard Gait Distance (Feet): 30 Feet Assistive device: None Gait Pattern/deviations: Step-through pattern, Decreased stride length, Antalgic Gait velocity: Decreased     General Gait Details: Slow, antalgic gait. unsteady with LOB. Difficulty dual tasking and had to stop when performing head turns.  Stairs            Wheelchair Mobility    Modified Rankin (Stroke Patients Only)       Balance Overall balance assessment: Needs assistance Sitting-balance support: Feet supported Sitting balance-Leahy Scale: Good     Standing balance support: No upper extremity supported Standing balance-Leahy Scale: Fair                               Pertinent Vitals/Pain Pain Assessment Pain Assessment: No/denies pain    Home Living Family/patient expects to be discharged to:: Skilled nursing facility                   Additional Comments: Patient stating he lives in a two level condo style apartment.  Stays on  the main level.    Prior Function Prior Level of Function : Needs assist             Mobility Comments: was working with therapy at SNF on ambulation ADLs Comments: Performs ADLs, sink baths on the main level.  No longer drives, difficulty with meds due to poor  vision, and has a friend that brings food.     Hand Dominance   Dominant Hand: Right    Extremity/Trunk Assessment   Upper Extremity Assessment Upper Extremity Assessment: Defer to OT evaluation    Lower Extremity Assessment Lower Extremity Assessment: Generalized weakness;RLE deficits/detail RLE Deficits / Details: Grossly 3/5 throughout    Cervical / Trunk Assessment Cervical / Trunk Assessment: Kyphotic  Communication   Communication: No difficulties  Cognition Arousal/Alertness: Awake/alert Behavior During Therapy: WFL for tasks assessed/performed Overall Cognitive Status: No family/caregiver present to determine baseline cognitive functioning Area of Impairment: Attention, Following commands, Awareness, Problem solving                   Current Attention Level: Selective   Following Commands: Follows one step commands inconsistently   Awareness: Emergent Problem Solving: Requires verbal cues, Slow processing General Comments: Decreased safety awareness and required continuous safety cues from PT. Unaware of current deficits. Pulling on condom cath and required cues to leave catheter alone.        General Comments      Exercises     Assessment/Plan    PT Assessment Patient needs continued PT services  PT Problem List Decreased strength;Decreased activity tolerance;Decreased balance;Decreased mobility;Decreased cognition;Decreased knowledge of use of DME;Decreased safety awareness;Decreased knowledge of precautions       PT Treatment Interventions DME instruction;Gait training;Functional mobility training;Therapeutic activities;Therapeutic exercise;Stair training;Balance training;Patient/family education    PT Goals (Current goals can be found in the Care Plan section)  Acute Rehab PT Goals Patient Stated Goal: to go to rehab PT Goal Formulation: With patient Time For Goal Achievement: 11/22/21 Potential to Achieve Goals: Fair    Frequency Min  2X/week     Co-evaluation               AM-PAC PT "6 Clicks" Mobility  Outcome Measure Help needed turning from your back to your side while in a flat bed without using bedrails?: A Little Help needed moving from lying on your back to sitting on the side of a flat bed without using bedrails?: A Little Help needed moving to and from a bed to a chair (including a wheelchair)?: A Little Help needed standing up from a chair using your arms (e.g., wheelchair or bedside chair)?: A Little Help needed to walk in hospital room?: A Little Help needed climbing 3-5 steps with a railing? : A Lot 6 Click Score: 17    End of Session Equipment Utilized During Treatment: Gait belt Activity Tolerance: Patient tolerated treatment well Patient left: in bed;with call bell/phone within reach;with bed alarm set Nurse Communication: Mobility status PT Visit Diagnosis: Unsteadiness on feet (R26.81);Muscle weakness (generalized) (M62.81);Difficulty in walking, not elsewhere classified (R26.2)    Time: 3300-7622 PT Time Calculation (min) (ACUTE ONLY): 13 min   Charges:   PT Evaluation $PT Eval Moderate Complexity: 1 Mod          Reuel Derby, PT, DPT  Acute Rehabilitation Services  Office: [phone]   Rudean Hitt 11/08/2021, 3:24 PM

## 2021-11-08 NOTE — Discharge Instructions (Signed)
You were admitted to the hospital for chest pain. It seems like your heart is doing ok with all the tests we ran, we have added one medication to your regimen called Wilder Glade which will help with your blood sugar and heart. I highly recommend seeing your primary care doctor, or stomach doctor so they can further evaluate your vomiting that comes and goes. It was a pleasure taking care of you!

## 2021-11-08 NOTE — TOC Progression Note (Addendum)
Transition of Care [facility]) - Progression Note    Patient Details  Name: [name] MRN: [id number] Date of Birth: 1955-08-10  Transition of Care [facility]) CM/SW [place], [place] Phone Number: 11/08/2021, 2:15 PM  Clinical Narrative:      CSW received call from [facility] and is informed that pt had reached a plataeu in his therapy at [facility] and their PT signed off. Pt had also received a notice that medicare would no longer cover STR at SNF due to pt's mobility level at SNF prior to this admission. He did not meet criteria/need for SNF level of care anymore.   1500: Informed by PT/OT that pt still needs LTC at SNF if STR no longer being approved by insurance. Spoke with [facility] liaison who informed CSW they don't have LTC beds available. CSW inquired about sister facilities and is informed to call their central intake at [phone]. CSW called central intake and spoke with Sharyn Lull. Sharyn Lull explained that [facility] should still be able to take pt back; CSW explained that [place] said they cannot. Michelle with Central intake will look into this. She did inform CSW that [facility] does not have LTC beds available. They may have LTC beds available in different cities. CSW to complete fl2 and fax bed requests in hub to secure a LTC bed. Will continue to speak with [facility] in hopes they can accept pt back with LTC as this is what pt wants.   1540: CSW notified TOC supervisor of situation. She will reach out to [facility].       Expected Discharge Plan and Services                                                 Social Determinants of Health (SDOH) Interventions    Readmission Risk Interventions    10/31/2021    2:47 PM 10/05/2019    4:42 PM  Readmission Risk Prevention Plan  Transportation Screening Complete Complete  PCP or Specialist Appt within 5-7 Days  Complete  Home Care Screening  Complete  Medication Review  (RN CM)  Complete  Medication Review (RN Care Manager) Complete   PCP or Specialist appointment within 3-5 days of discharge Complete   SW Recovery Care/Counseling Consult Complete   [place] Complete

## 2021-11-08 NOTE — TOC Initial Note (Signed)
Transition of Care [facility]) - Initial/Assessment Note    Patient Details  Name: [name] MRN: [id number] Date of Birth: May 16, 1955  Transition of Care [facility]) CM/SW Contact:    Ninfa Meeker, RN Phone Number: 11/08/2021, 10:14 AM  Clinical Narrative:                 Transition of Care Screening Note:Transition of Care Department [facility]) has reviewed patient and no TOC needs have been identified at this time. We will continue to monitor patient advancement through Interdisciplinary progressions. If new patient transition needs arise, please place a consult.          Patient Goals and CMS Choice        Expected Discharge Plan and Services                                                Prior Living Arrangements/Services                       Activities of Daily Living      Permission Sought/Granted                  Emotional Assessment              Admission diagnosis:  Chest pain in adult [R07.9] Atypical chest pain [R07.89] Patient Active Problem List   Diagnosis Date Noted   Atypical chest pain 11/07/2021   NSTEMI (non-ST elevated myocardial infarction) ([place]) 10/29/2021   Syncope and collapse 09/02/2021   History of prostate cancer with recurrent urinary obstruction  03/24/2021   Polycythemia 03/24/2021   COVID-19 virus infection 03/24/2021   History of noncompliance with medical treatment 03/24/2021   HLD (hyperlipidemia) 01/05/2021   Acute urinary retention 10/25/2020   Adjustment disorder with mixed anxiety and depressed mood    Ataxia 08/01/2020   Dizziness 07/31/2020   Hydroureteronephrosis 02/10/2020   Nausea and vomiting 02/10/2020   Pneumonia due to COVID-19 virus 09/29/2019   Hyponatremia 09/28/2019   Sinus tachycardia 09/28/2019   Insomnia 08/26/2018   Nephropathy 03/26/2018   Urinary urgency 03/26/2018   Type 2 diabetes mellitus with diabetic nephropathy, without long-term current use of insulin ([place])  03/26/2018   Essential hypertension 03/26/2018   Chronic kidney disease (CKD), stage III (moderate) ([place]) 03/26/2018   Conductive hearing loss, bilateral 11/07/2017   Microcytic anemia 02/17/2012   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   Benign hypertension 02/17/2012   Depressive disorder 02/17/2012   PCP:  Pcp, No Pharmacy:   [id number] [place]Phone: [phone]: [phone]    Social Determinants of Health (SDOH) Interventions    Readmission Risk Interventions    10/31/2021    2:47 PM 10/05/2019    4:42 PM  Readmission Risk Prevention Plan  Transportation Screening Complete Complete  PCP or Specialist Appt within 5-7 Days  Complete  Home Care Screening  Complete  Medication Review (RN CM)  Complete  Medication Review (RN Care Manager) Complete   PCP or Specialist appointment within 3-5 days of discharge Complete   SW Recovery Care/Counseling Consult Complete   Palliative Care [place]ening [place]

## 2021-11-08 NOTE — Progress Notes (Addendum)
Progress Note  Patient Name: [name] Date of Encounter: 11/08/2021  [facility] Cardiologist: Larae Grooms, MD   Subjective   Pt continues to complain of chest pain, appears comfortable.  Inpatient Medications    Scheduled Meds:  aspirin  81 mg Oral Daily   insulin aspart  0-15 Units Subcutaneous TID WC   insulin glargine-yfgn  10 Units Subcutaneous QHS   metoprolol succinate  25 mg Oral Daily   pantoprazole (PROTONIX) IV  40 mg Intravenous Q12H   sodium chloride flush  3 mL Intravenous Q12H   ticagrelor  90 mg Oral BID   Continuous Infusions:  sodium chloride     PRN Meds: sodium chloride, acetaminophen, ondansetron (ZOFRAN) IV, sodium chloride flush   Vital Signs    Vitals:   11/07/21 1641 11/07/21 2003 11/07/21 2332 11/08/21 0431  BP: 133/89 (!) 159/89 (!) 128/90 132/83  Pulse: (!) 55 (!) 56 (!) 45 (!) 57  Resp: '15 16  15  '$ Temp:  97.6 F (36.4 C) 97.7 F (36.5 C) 98.2 F (36.8 C)  TempSrc:  Oral Oral Oral  SpO2: 98% 100% 94% 94%  Weight:      Height:        Intake/Output Summary (Last 24 hours) at 11/08/2021 0737 Last data filed at 11/08/2021 0433 Gross per 24 hour  Intake 926.84 ml  Output 600 ml  Net 326.84 ml      11/07/2021    4:27 AM 10/31/2021    5:24 AM 10/29/2021    9:25 PM  Last 3 Weights  Weight (lbs) 160 lb 160 lb 4.8 oz 163 lb 2.3 oz  Weight (kg) 72.576 kg 72.712 kg 74 kg      Telemetry    Sinus bradycardia with HR 50s - Personally Reviewed  ECG    Sinus bradycardia with HR 57 and TWI V1-V4 - Personally Reviewed  Physical Exam   GEN: No acute distress.   Neck: No JVD Cardiac: RRR, no murmurs, rubs, or gallops.  Respiratory: Clear to auscultation bilaterally. GI: Soft, nontender, non-distended  MS: No edema; No deformity. Neuro:  Nonfocal  Psych: Normal affect  Right radial cath site C/D/I  Labs    High Sensitivity Troponin:   Recent Labs  Lab 10/29/21 0611 10/29/21 1424 10/29/21 1601 11/07/21 0426  11/07/21 0702  TROPONINIHS 3,473* 9,554* 10,903* 51* 42*     Chemistry Recent Labs  Lab 11/07/21 0426 11/08/21 0629  NA 141 138  K 3.9 4.0  CL 108 109  CO2 24 23  GLUCOSE 155* 140*  BUN 23 20  CREATININE 1.57* 1.40*  CALCIUM 9.3 8.6*  PROT 6.4*  --   ALBUMIN 3.3*  --   AST 19  --   ALT 29  --   ALKPHOS 121  --   BILITOT 0.7  --   GFRNONAA 48* 55*  ANIONGAP 9 6    Lipids No results for input(s): "CHOL", "TRIG", "HDL", "LABVLDL", "LDLCALC", "CHOLHDL" in the last 168 hours.  Hematology Recent Labs  Lab 11/07/21 0426 11/08/21 0629  WBC 8.5 8.3  RBC 5.63 5.32  HGB 16.3 15.6  HCT 49.4 45.6  MCV 87.7 85.7  MCH 29.0 29.3  MCHC 33.0 34.2  RDW 13.2 13.2  PLT 229 196   Thyroid No results for input(s): "TSH", "FREET4" in the last 168 hours.  BNPNo results for input(s): "BNP", "PROBNP" in the last 168 hours.  DDimer No results for input(s): "DDIMER" in the last 168 hours.   Radiology  CARDIAC CATHETERIZATION  Result Date: 11/07/2021   Prox LAD to Mid LAD lesion is 30% stenosed.   Prox RCA lesion is 20% stenosed.   1st Diag lesion is 70% stenosed.   1st Mrg lesion is 60% stenosed.   RPDA lesion is 40% stenosed.   Non-stenotic Mid LAD lesion was previously treated. 1.  Widely patent mid LAD stent with mild to moderate disease elsewhere. 2.  RFR negative obtuse marginal and PDA lesions. 3.  LVEDP of 5 mmHg. Recommendation: Medical therapy.  Evaluate for noncardiac etiologies of chest pain.   DG Chest 2 View  Result Date: 11/07/2021 CLINICAL DATA:  Chest pain and burning associated with vomiting. EXAM: CHEST - 2 VIEW COMPARISON:  PA Lat 10/29/2021 FINDINGS: The heart size and mediastinal contours are within normal limits. Both lungs are clear. The visualized skeletal structures are intact, with thoracic spondylosis. IMPRESSION: No active cardiopulmonary disease.  Stable chest. Electronically Signed   By: Telford Nab M.D.   On: 11/07/2021 05:24    Cardiac Studies   Left  heart cath 11/07/21:   Prox LAD to Mid LAD lesion is 30% stenosed.   Prox RCA lesion is 20% stenosed.   1st Diag lesion is 70% stenosed.   1st Mrg lesion is 60% stenosed.   RPDA lesion is 40% stenosed.   Non-stenotic Mid LAD lesion was previously treated.   1.  Widely patent mid LAD stent with mild to moderate disease elsewhere. 2.  RFR negative obtuse marginal and PDA lesions. 3.  LVEDP of 5 mmHg.   Recommendation: Medical therapy.  Evaluate for noncardiac etiologies of chest pain.  Patient Profile     66 y.o. male with a hx of medical noncompliance, hypertension, depression, anxiety, anemia, postpolio syndrome, DM, CKD stage III, hyperlipidemia, prostate cancer, GERD, CAD with recent NSTEMI with DES-mLAD on 10/29/21 who is being seen 11/07/2021 for the evaluation of chest pain at the request of Dr. Alvino Chapel.  Assessment & Plan    Chest pain Chest pain felt to be atypical. However, in the setting of known disease, recent PCI, and new TWI anterolateral leads, decision was made for repeat LHC. Agniography revealed stable disease and patent stent. No intervention. Given new TWI, will obtain limited echo.  Continue ASA and briltina. Will add protonix and increase amlodipine to 7.5 mg.   CAD Hyperlipidemia with LDL goal < 70 DES-mLAD 10/29/21 As above, continue ASA and brilinta, increase amlodipine, continue BB and statin.    Hypertension Medications as above   CKD stage III Renal function remains stable after heart cath yesterday.    DM Insulin per primary Adding farxiga - would be free for him.    Will obtain limited echo today. If echo unremarkable, may discharge back to SNF this afternoon from a cardiology standpoint.        For questions or updates, please contact [facility] Please consult [email or web address] for contact info under        Signed, Ledora Bottcher, PA  11/08/2021, 7:37 AM    Agree with note by Fabian Sharp PA-C  Patient admitted with atypical  chest pain status post LAD intervention 4 months ago by Dr. Ellyn Hack.  His enzymes were low.  EKG did show no changes.  Cardiac catheterization performed yesterday by Dr. Ali Lowe revealed a patent LAD stent with some mild to moderate LAD disease just proximal to this unchanged from prior cath.  He performed DFR of the obtuse marginal branch and PDA lesions which were not physiologically significant.  Plan 2D echo this morning and anticipate potential discharge this afternoon back to the skilled nursing facility.  Lorretta Harp, M.D., [place], [facility], [place] [address] [place], [place]  [address]  [phone] 11/08/2021 8:31 AM

## 2021-11-08 NOTE — Telephone Encounter (Signed)
Pharmacy Patient Advocate Encounter  Insurance verification completed.    The patient is insured through [employer] Medicare Part D   The patient is currently admitted and ran test claims for the following: Farxiga, Jardiance.  Copays and coinsurance results were relayed to Inpatient clinical team.  

## 2021-11-09 DIAGNOSIS — N189 Chronic kidney disease, unspecified: Secondary | ICD-10-CM | POA: Diagnosis not present

## 2021-11-09 DIAGNOSIS — F32A Depression, unspecified: Secondary | ICD-10-CM | POA: Diagnosis not present

## 2021-11-09 DIAGNOSIS — K219 Gastro-esophageal reflux disease without esophagitis: Secondary | ICD-10-CM | POA: Diagnosis not present

## 2021-11-09 DIAGNOSIS — I10 Essential (primary) hypertension: Secondary | ICD-10-CM | POA: Diagnosis not present

## 2021-11-09 DIAGNOSIS — E785 Hyperlipidemia, unspecified: Secondary | ICD-10-CM | POA: Diagnosis not present

## 2021-11-09 DIAGNOSIS — E119 Type 2 diabetes mellitus without complications: Secondary | ICD-10-CM | POA: Diagnosis not present

## 2021-11-15 DIAGNOSIS — E119 Type 2 diabetes mellitus without complications: Secondary | ICD-10-CM | POA: Diagnosis not present

## 2021-11-15 DIAGNOSIS — I10 Essential (primary) hypertension: Secondary | ICD-10-CM | POA: Diagnosis not present

## 2021-11-15 DIAGNOSIS — E785 Hyperlipidemia, unspecified: Secondary | ICD-10-CM | POA: Diagnosis not present

## 2021-11-15 DIAGNOSIS — K219 Gastro-esophageal reflux disease without esophagitis: Secondary | ICD-10-CM | POA: Diagnosis not present

## 2021-11-15 DIAGNOSIS — N189 Chronic kidney disease, unspecified: Secondary | ICD-10-CM | POA: Diagnosis not present

## 2021-11-15 DIAGNOSIS — F32A Depression, unspecified: Secondary | ICD-10-CM | POA: Diagnosis not present

## 2021-11-16 DIAGNOSIS — D369 Benign neoplasm, unspecified site: Secondary | ICD-10-CM | POA: Diagnosis not present

## 2021-11-16 DIAGNOSIS — N189 Chronic kidney disease, unspecified: Secondary | ICD-10-CM | POA: Diagnosis not present

## 2021-11-16 DIAGNOSIS — F32A Depression, unspecified: Secondary | ICD-10-CM | POA: Diagnosis not present

## 2021-11-16 DIAGNOSIS — E785 Hyperlipidemia, unspecified: Secondary | ICD-10-CM | POA: Diagnosis not present

## 2021-11-16 DIAGNOSIS — E119 Type 2 diabetes mellitus without complications: Secondary | ICD-10-CM | POA: Diagnosis not present

## 2021-11-16 DIAGNOSIS — K219 Gastro-esophageal reflux disease without esophagitis: Secondary | ICD-10-CM | POA: Diagnosis not present

## 2021-11-23 DIAGNOSIS — R410 Disorientation, unspecified: Secondary | ICD-10-CM | POA: Diagnosis not present

## 2021-11-23 DIAGNOSIS — K219 Gastro-esophageal reflux disease without esophagitis: Secondary | ICD-10-CM | POA: Diagnosis not present

## 2021-11-23 DIAGNOSIS — N189 Chronic kidney disease, unspecified: Secondary | ICD-10-CM | POA: Diagnosis not present

## 2021-11-23 DIAGNOSIS — I10 Essential (primary) hypertension: Secondary | ICD-10-CM | POA: Diagnosis not present

## 2021-11-28 ENCOUNTER — Emergency Department (HOSPITAL_COMMUNITY): Payer: Medicare Other

## 2021-11-28 ENCOUNTER — Emergency Department (HOSPITAL_COMMUNITY)
Admission: EM | Admit: 2021-11-28 | Discharge: 2021-11-28 | Disposition: A | Discharge status: Home / Self Care | Payer: Medicare Other | Attending: Emergency Medicine | Admitting: Emergency Medicine

## 2021-11-28 ENCOUNTER — Telehealth: Payer: Self-pay

## 2021-11-28 ENCOUNTER — Other Ambulatory Visit: Payer: Self-pay

## 2021-11-28 DIAGNOSIS — Z7982 Long term (current) use of aspirin: Secondary | ICD-10-CM | POA: Insufficient documentation

## 2021-11-28 DIAGNOSIS — I499 Cardiac arrhythmia, unspecified: Secondary | ICD-10-CM | POA: Diagnosis not present

## 2021-11-28 DIAGNOSIS — R079 Chest pain, unspecified: Secondary | ICD-10-CM | POA: Diagnosis not present

## 2021-11-28 DIAGNOSIS — R0789 Other chest pain: Secondary | ICD-10-CM | POA: Diagnosis not present

## 2021-11-28 DIAGNOSIS — Z743 Need for continuous supervision: Secondary | ICD-10-CM | POA: Diagnosis not present

## 2021-11-28 LAB — BASIC METABOLIC PANEL
Anion gap: 13 (ref 5–15)
BUN: 31 mg/dL — ABNORMAL HIGH (ref 8–23)
CO2: 15 mmol/L — ABNORMAL LOW (ref 22–32)
Calcium: 9.1 mg/dL (ref 8.9–10.3)
Chloride: 111 mmol/L (ref 98–111)
Creatinine, Ser: 1.72 mg/dL — ABNORMAL HIGH (ref 0.61–1.24)
GFR, Estimated: 43 mL/min — ABNORMAL LOW (ref >60)
Glucose, Bld: 211 mg/dL — ABNORMAL HIGH (ref 70–99)
Potassium: 4.4 mmol/L (ref 3.5–5.1)
Sodium: 139 mmol/L (ref 135–145)

## 2021-11-28 LAB — CBC
HCT: 47.3 % (ref 39.0–52.0)
Hemoglobin: 15.9 g/dL (ref 13.0–17.0)
MCH: 29.6 pg (ref 26.0–34.0)
MCHC: 33.6 g/dL (ref 30.0–36.0)
MCV: 88.1 fL (ref 80.0–100.0)
Platelets: 218 10*3/uL (ref 150–400)
RBC: 5.37 MIL/uL (ref 4.22–5.81)
RDW: 14.3 % (ref 11.5–15.5)
WBC: 8.6 10*3/uL (ref 4.0–10.5)
nRBC: 0 % (ref 0.0–0.2)

## 2021-11-28 LAB — TROPONIN I (HIGH SENSITIVITY): Troponin I (High Sensitivity): 8 ng/L (ref <18)

## 2021-11-28 NOTE — ED Notes (Signed)
Pt refusing IV 

## 2021-11-28 NOTE — Discharge Instructions (Addendum)
Return for any problem.  ?

## 2021-11-28 NOTE — ED Notes (Signed)
Pt back in room.

## 2021-11-28 NOTE — ED Provider Notes (Signed)
[facility] EMERGENCY DEPARTMENT Provider Note   CSN: [id number] Arrival date & time: 11/28/21  0630     History  Chief Complaint  Patient presents with   Chest Pain    [name] is a 66 y.o. male.  66 year old male with prior medical history as detailed below presents for evaluation.  Patient reports chest pain that began last night around 9:00.  Patient reports that his pain is completely resolved.  Within minutes of my introducing myself he reports that he likes would like to leave.  His work-up is barely begun.  He does understand that he will be leaving AGAINST MEDICAL ADVICE.  He is alert and competent.  He can make his own decisions.  He did not provide additional details about his symptoms last night.  He is adamant about leaving [place].  The history is provided by the patient and medical records.  Chest Pain      Home Medications Prior to Admission medications   Medication Sig Start Date End Date Taking? Authorizing Provider  amLODipine (NORVASC) 5 MG tablet Take 1 tablet (5 mg total) by mouth daily. 11/01/21   Nooruddin, Marlene Lard, MD  aspirin EC 81 MG tablet Take 1 tablet (81 mg total) by mouth daily. Swallow whole. 11/01/21   Nooruddin, Marlene Lard, MD  atorvastatin (LIPITOR) 80 MG tablet Take 1 tablet (80 mg total) by mouth daily. 11/01/21   Nooruddin, Marlene Lard, MD  dapagliflozin propanediol (FARXIGA) 10 MG TABS tablet Take 1 tablet (10 mg total) by mouth daily. 11/09/21   Nooruddin, Marlene Lard, MD  finasteride (PROSCAR) 5 MG tablet Take 1 tablet (5 mg total) by mouth daily. 11/01/21   Nooruddin, Marlene Lard, MD  insulin aspart (NOVOLOG) 100 UNIT/ML injection Inject 4 Units into the skin 3 (three) times daily with meals. 09/11/21   [place] Eric J, DO  insulin glargine-yfgn (SEMGLEE) 100 UNIT/ML injection Inject 0.2 mLs (20 Units total) into the skin daily. 09/12/21   [place] Eric J, DO  LORazepam (ATIVAN) 0.5 MG tablet Take 1 tablet (0.5 mg total) by mouth every 8  (eight) hours as needed for anxiety. 11/08/21   Riesa Pope, MD  metoprolol succinate (TOPROL-XL) 25 MG 24 hr tablet Take 1 tablet (25 mg total) by mouth daily. 10/31/21   Nooruddin, Marlene Lard, MD  sertraline (ZOLOFT) 50 MG tablet Take 1 tablet (50 mg total) by mouth daily. 09/12/21   [place] Donnamarie Poag, DO  ticagrelor (BRILINTA) 90 MG TABS tablet Take 1 tablet (90 mg total) by mouth 2 (two) times daily. 10/31/21   Nooruddin, Marlene Lard, MD      Allergies    Haldol [haloperidol]    Review of Systems   Review of Systems  Cardiovascular:  Positive for chest pain.  All other systems reviewed and are negative.   Physical Exam Updated Vital Signs BP (!) 157/89 (BP Location: Right Arm)   Pulse 70   Temp 98.4 F (36.9 C) (Oral)   Resp 18   Wt 74.8 kg   SpO2 98%   BMI 26.63 kg/m  Physical Exam Vitals and nursing note reviewed.  Constitutional:      General: He is not in acute distress.    Appearance: Normal appearance. He is well-developed.  HENT:     Head: Normocephalic and atraumatic.  Eyes:     Conjunctiva/sclera: Conjunctivae normal.     Pupils: Pupils are equal, round, and reactive to light.  Cardiovascular:     Rate and Rhythm: Normal rate and regular rhythm.  Heart sounds: Normal heart sounds.  Pulmonary:     Effort: Pulmonary effort is normal. No respiratory distress.     Breath sounds: Normal breath sounds.  Abdominal:     General: There is no distension.     Palpations: Abdomen is soft.     Tenderness: There is no abdominal tenderness.  Musculoskeletal:        General: No deformity. Normal range of motion.     Cervical back: Normal range of motion and neck supple.  Skin:    General: Skin is warm and dry.  Neurological:     General: No focal deficit present.     Mental Status: He is alert and oriented to person, place, and time.     ED Results / Procedures / Treatments   Labs (all labs ordered are listed, but only abnormal results are displayed) Labs Reviewed   BASIC METABOLIC PANEL  CBC  TROPONIN I (HIGH SENSITIVITY)    EKG EKG Interpretation  Date/Time:  Wednesday November 28 2021 06:42:38 EDT Ventricular Rate:  70 PR Interval:  141 QRS Duration: 83 QT Interval:  369 QTC Calculation: 399 R Axis:   -11 Text Interpretation: Sinus rhythm Probable left atrial enlargement Anteroseptal infarct, age indeterminate Confirmed by Dene Gentry [phone]) on 11/28/2021 6:57:21 AM  Radiology No results found.  Procedures Procedures    Medications Ordered in ED Medications - No data to display  ED Course/ Medical Decision Making/ A&P                           Medical Decision Making Amount and/or Complexity of Data Reviewed Labs: ordered. Radiology: ordered.    Medical Screen Complete  This patient presented to the ED with complaint of chest pain.  This complaint involves an extensive number of treatment options. The initial differential diagnosis includes, but is not limited to, ACS, metabolic abnormality, etc.  This presentation is: Acute, Self-Limited, Previously Undiagnosed, Uncertain Prognosis, Complicated, Systemic Symptoms, and Threat to Life/Bodily Function  Patient presented with complaint of chest pain.  Patient reports that he is completely chest pain-free at time of evaluation.  Patient did not provide significant details about his recent symptoms.  He desires DC home -patient is advised that if he leaves before work-up is initiated then leaving [place] is appropriate.  Patient has capacity for making his own medical decisions.  He is advised that missed undiagnosed cardiac disease could result in his death.  Patient still desires to leave.  Importance of close follow-up is repeatedly stressed.  Strict return precautions given and understood.    Additional history obtained: External records from outside sources obtained and reviewed including prior ED visits and prior Inpatient records.   Problem  List / ED Course:  Chest pain   Reevaluation:  After the interventions noted above, I reevaluated the patient and found that they have: resolved   Disposition:  After consideration of the diagnostic results and the patients response to treatment, I feel that the patent would benefit from completion of ED evaluation.          Final Clinical Impression(s) / ED Diagnoses Final diagnoses:  Chest pain, unspecified type    Rx / DC Orders ED Discharge Orders     None         Valarie Merino, MD 11/28/21 [phone]

## 2021-11-28 NOTE — ED Notes (Addendum)
Pt signed electronically AMA.  Pt educated about risks and complications including death due to leaving without proper evaluation and treatment.  Pt refusal and wants "to leave"

## 2021-11-28 NOTE — ED Triage Notes (Signed)
Presents from [facility] via [place] for CP since 9pm last night. No radiation, 6/10, 4/10 after 1 nitro, also received 324 ASA. States feels like MI 3 weeks ago.  EMS VS NSR. 150/90 , 70 bpm, 208 CBG H/o htn, depression, bipolar, erectile dysfunction (none of these meds in past 24 hr)

## 2021-11-28 NOTE — ED Notes (Signed)
Patient transported to X-ray 

## 2021-11-28 NOTE — Telephone Encounter (Signed)
         Patient  visited [facility] ED on 9/13    Telephone encounter attempt :  1ST  Unable to leave a message     [place], [place] Management  [phone] [address], [place], [place] [address] Phone: [phone] Email: [email or web address]

## 2021-11-29 DIAGNOSIS — E785 Hyperlipidemia, unspecified: Secondary | ICD-10-CM | POA: Diagnosis not present

## 2021-11-29 DIAGNOSIS — R410 Disorientation, unspecified: Secondary | ICD-10-CM | POA: Diagnosis not present

## 2021-11-29 DIAGNOSIS — I10 Essential (primary) hypertension: Secondary | ICD-10-CM | POA: Diagnosis not present

## 2021-11-29 DIAGNOSIS — N189 Chronic kidney disease, unspecified: Secondary | ICD-10-CM | POA: Diagnosis not present

## 2021-11-29 DIAGNOSIS — K219 Gastro-esophageal reflux disease without esophagitis: Secondary | ICD-10-CM | POA: Diagnosis not present

## 2021-11-29 DIAGNOSIS — E119 Type 2 diabetes mellitus without complications: Secondary | ICD-10-CM | POA: Diagnosis not present

## 2021-11-29 DIAGNOSIS — R079 Chest pain, unspecified: Secondary | ICD-10-CM | POA: Diagnosis not present

## 2021-11-30 ENCOUNTER — Telehealth: Payer: Self-pay

## 2021-11-30 DIAGNOSIS — M109 Gout, unspecified: Secondary | ICD-10-CM | POA: Diagnosis not present

## 2021-11-30 DIAGNOSIS — K219 Gastro-esophageal reflux disease without esophagitis: Secondary | ICD-10-CM | POA: Diagnosis not present

## 2021-11-30 DIAGNOSIS — N189 Chronic kidney disease, unspecified: Secondary | ICD-10-CM | POA: Diagnosis not present

## 2021-11-30 DIAGNOSIS — F32A Depression, unspecified: Secondary | ICD-10-CM | POA: Diagnosis not present

## 2021-11-30 DIAGNOSIS — R0602 Shortness of breath: Secondary | ICD-10-CM | POA: Diagnosis not present

## 2021-11-30 DIAGNOSIS — R0989 Other specified symptoms and signs involving the circulatory and respiratory systems: Secondary | ICD-10-CM | POA: Diagnosis not present

## 2021-11-30 DIAGNOSIS — E785 Hyperlipidemia, unspecified: Secondary | ICD-10-CM | POA: Diagnosis not present

## 2021-11-30 DIAGNOSIS — R410 Disorientation, unspecified: Secondary | ICD-10-CM | POA: Diagnosis not present

## 2021-11-30 DIAGNOSIS — I10 Essential (primary) hypertension: Secondary | ICD-10-CM | POA: Diagnosis not present

## 2021-11-30 DIAGNOSIS — E119 Type 2 diabetes mellitus without complications: Secondary | ICD-10-CM | POA: Diagnosis not present

## 2021-11-30 NOTE — Telephone Encounter (Signed)
     Patient  visit on 9/13  at [facility] was for chest pain  Have you been able to follow up with your primary care physician? yes  The patient was or was not able to obtain any needed medicine or equipment.yes  Are there diet recommendations that you are having difficulty following?na  Patient expresses understanding of discharge instructions and education provided has no other needs at this time. yes    [place], Care Management  [phone] [address], [place], [place] [address] Phone: [phone] Email: [email or web address]

## 2021-12-03 DIAGNOSIS — M109 Gout, unspecified: Secondary | ICD-10-CM | POA: Diagnosis not present

## 2021-12-04 DIAGNOSIS — E1121 Type 2 diabetes mellitus with diabetic nephropathy: Secondary | ICD-10-CM | POA: Diagnosis not present

## 2021-12-04 DIAGNOSIS — I10 Essential (primary) hypertension: Secondary | ICD-10-CM | POA: Diagnosis not present

## 2021-12-04 DIAGNOSIS — F32A Depression, unspecified: Secondary | ICD-10-CM | POA: Diagnosis not present

## 2021-12-10 DIAGNOSIS — B356 Tinea cruris: Secondary | ICD-10-CM | POA: Diagnosis not present

## 2021-12-13 DIAGNOSIS — I10 Essential (primary) hypertension: Secondary | ICD-10-CM | POA: Diagnosis not present

## 2021-12-13 DIAGNOSIS — E1121 Type 2 diabetes mellitus with diabetic nephropathy: Secondary | ICD-10-CM | POA: Diagnosis not present

## 2021-12-20 DIAGNOSIS — E1121 Type 2 diabetes mellitus with diabetic nephropathy: Secondary | ICD-10-CM | POA: Diagnosis not present

## 2021-12-20 DIAGNOSIS — I10 Essential (primary) hypertension: Secondary | ICD-10-CM | POA: Diagnosis not present

## 2021-12-26 ENCOUNTER — Emergency Department (HOSPITAL_COMMUNITY): Payer: Medicare Other

## 2021-12-26 ENCOUNTER — Other Ambulatory Visit: Payer: Self-pay

## 2021-12-26 ENCOUNTER — Emergency Department (HOSPITAL_COMMUNITY)
Admission: EM | Admit: 2021-12-26 | Discharge: 2021-12-26 | Discharge status: Against Medical Advice | Payer: Medicare Other | Attending: Emergency Medicine | Admitting: Emergency Medicine

## 2021-12-26 DIAGNOSIS — R32 Unspecified urinary incontinence: Secondary | ICD-10-CM | POA: Insufficient documentation

## 2021-12-26 DIAGNOSIS — Z5321 Procedure and treatment not carried out due to patient leaving prior to being seen by health care provider: Secondary | ICD-10-CM | POA: Insufficient documentation

## 2021-12-26 DIAGNOSIS — K8020 Calculus of gallbladder without cholecystitis without obstruction: Secondary | ICD-10-CM | POA: Diagnosis not present

## 2021-12-26 DIAGNOSIS — R1031 Right lower quadrant pain: Secondary | ICD-10-CM | POA: Insufficient documentation

## 2021-12-26 DIAGNOSIS — Z743 Need for continuous supervision: Secondary | ICD-10-CM | POA: Diagnosis not present

## 2021-12-26 DIAGNOSIS — N281 Cyst of kidney, acquired: Secondary | ICD-10-CM | POA: Diagnosis not present

## 2021-12-26 DIAGNOSIS — R6889 Other general symptoms and signs: Secondary | ICD-10-CM | POA: Diagnosis not present

## 2021-12-26 DIAGNOSIS — R109 Unspecified abdominal pain: Secondary | ICD-10-CM | POA: Diagnosis not present

## 2021-12-26 DIAGNOSIS — N1330 Unspecified hydronephrosis: Secondary | ICD-10-CM | POA: Diagnosis not present

## 2021-12-26 LAB — BASIC METABOLIC PANEL
Anion gap: 12 (ref 5–15)
BUN: 24 mg/dL — ABNORMAL HIGH (ref 8–23)
CO2: 21 mmol/L — ABNORMAL LOW (ref 22–32)
Calcium: 9.6 mg/dL (ref 8.9–10.3)
Chloride: 103 mmol/L (ref 98–111)
Creatinine, Ser: 1.33 mg/dL — ABNORMAL HIGH (ref 0.61–1.24)
GFR, Estimated: 59 mL/min — ABNORMAL LOW (ref >60)
Glucose, Bld: 217 mg/dL — ABNORMAL HIGH (ref 70–99)
Potassium: 3.9 mmol/L (ref 3.5–5.1)
Sodium: 136 mmol/L (ref 135–145)

## 2021-12-26 LAB — CBC WITH DIFFERENTIAL/PLATELET
Abs Immature Granulocytes: 0.03 10*3/uL (ref 0.00–0.07)
Basophils Absolute: 0.1 10*3/uL (ref 0.0–0.1)
Basophils Relative: 1 %
Eosinophils Absolute: 0.3 10*3/uL (ref 0.0–0.5)
Eosinophils Relative: 4 %
HCT: 51 % (ref 39.0–52.0)
Hemoglobin: 17.3 g/dL — ABNORMAL HIGH (ref 13.0–17.0)
Immature Granulocytes: 0 %
Lymphocytes Relative: 19 %
Lymphs Abs: 1.5 10*3/uL (ref 0.7–4.0)
MCH: 29.3 pg (ref 26.0–34.0)
MCHC: 33.9 g/dL (ref 30.0–36.0)
MCV: 86.4 fL (ref 80.0–100.0)
Monocytes Absolute: 0.8 10*3/uL (ref 0.1–1.0)
Monocytes Relative: 10 %
Neutro Abs: 5.5 10*3/uL (ref 1.7–7.7)
Neutrophils Relative %: 66 %
Platelets: 205 10*3/uL (ref 150–400)
RBC: 5.9 MIL/uL — ABNORMAL HIGH (ref 4.22–5.81)
RDW: 13.4 % (ref 11.5–15.5)
WBC: 8.2 10*3/uL (ref 4.0–10.5)
nRBC: 0 % (ref 0.0–0.2)

## 2021-12-26 NOTE — ED Provider Triage Note (Signed)
  Emergency Medicine Provider Triage Evaluation Note  MRN:  [id number]  Arrival date & time: 12/26/21    Medically screening exam initiated at 4:15 AM.   CC:   Flank Pain   HPI:  [name] is a 66 y.o. year-old male presents to the ED with chief complaint of right flank pain.  Onset tonight.  States that this isn't new, but is worse than normal.  Recent MI with stents.  Denies CP or SOB. Denies urinary pain, but has had some urinary incontinence.  History provided by patient. ROS:  -As included in HPI PE:   Vitals:   12/26/21 0407  BP: (!) 173/103  Pulse: 66  Resp: 17  Temp: 98 F (36.7 C)  SpO2: 97%    Non-toxic appearing No respiratory distress  MDM:  Right flank pain I've ordered labs and imaging in triage to expedite lab/diagnostic workup.  Patient was informed that the remainder of the evaluation will be completed by another provider, this initial triage assessment does not replace that evaluation, and the importance of remaining in the ED until their evaluation is complete.    [name], PA-C 12/26/21 [phone]

## 2021-12-26 NOTE — ED Triage Notes (Signed)
Pt here via [facility] from [facility] was called for R side pain. Pt has hx of kidney disease and has had issues w/ kidneys. Pain started at 7pm, pt is ambulatory. 148/104, cbg 194,  97% RA, 70HR.

## 2021-12-26 NOTE — ED Notes (Signed)
Called patient several times patient didn't answer  

## 2021-12-27 DIAGNOSIS — E119 Type 2 diabetes mellitus without complications: Secondary | ICD-10-CM | POA: Diagnosis not present

## 2021-12-27 DIAGNOSIS — I2510 Atherosclerotic heart disease of native coronary artery without angina pectoris: Secondary | ICD-10-CM | POA: Diagnosis not present

## 2021-12-27 DIAGNOSIS — I10 Essential (primary) hypertension: Secondary | ICD-10-CM | POA: Diagnosis not present

## 2021-12-27 DIAGNOSIS — K219 Gastro-esophageal reflux disease without esophagitis: Secondary | ICD-10-CM | POA: Diagnosis not present

## 2021-12-28 DIAGNOSIS — M19012 Primary osteoarthritis, left shoulder: Secondary | ICD-10-CM | POA: Diagnosis not present

## 2021-12-28 DIAGNOSIS — M25522 Pain in left elbow: Secondary | ICD-10-CM | POA: Diagnosis not present

## 2021-12-28 DIAGNOSIS — M79632 Pain in left forearm: Secondary | ICD-10-CM | POA: Diagnosis not present

## 2022-01-11 DIAGNOSIS — E785 Hyperlipidemia, unspecified: Secondary | ICD-10-CM | POA: Diagnosis not present

## 2022-01-11 DIAGNOSIS — I10 Essential (primary) hypertension: Secondary | ICD-10-CM | POA: Diagnosis not present

## 2022-01-13 ENCOUNTER — Observation Stay (HOSPITAL_COMMUNITY): Payer: Medicare Other

## 2022-01-13 ENCOUNTER — Other Ambulatory Visit: Payer: Self-pay

## 2022-01-13 ENCOUNTER — Emergency Department (HOSPITAL_COMMUNITY): Payer: Medicare Other

## 2022-01-13 ENCOUNTER — Observation Stay (HOSPITAL_COMMUNITY)
Admission: EM | Admit: 2022-01-13 | Discharge: 2022-01-15 | Disposition: A | Discharge status: Skilled Nursing Facility | Payer: Medicare Other | Attending: Internal Medicine | Admitting: Internal Medicine

## 2022-01-13 ENCOUNTER — Encounter (HOSPITAL_COMMUNITY): Payer: Self-pay | Admitting: Emergency Medicine

## 2022-01-13 DIAGNOSIS — S065X0A Traumatic subdural hemorrhage without loss of consciousness, initial encounter: Secondary | ICD-10-CM | POA: Diagnosis not present

## 2022-01-13 DIAGNOSIS — I2510 Atherosclerotic heart disease of native coronary artery without angina pectoris: Secondary | ICD-10-CM

## 2022-01-13 DIAGNOSIS — Z955 Presence of coronary angioplasty implant and graft: Secondary | ICD-10-CM | POA: Insufficient documentation

## 2022-01-13 DIAGNOSIS — Z7982 Long term (current) use of aspirin: Secondary | ICD-10-CM | POA: Diagnosis not present

## 2022-01-13 DIAGNOSIS — Z8546 Personal history of malignant neoplasm of prostate: Secondary | ICD-10-CM

## 2022-01-13 DIAGNOSIS — Z7984 Long term (current) use of oral hypoglycemic drugs: Secondary | ICD-10-CM | POA: Insufficient documentation

## 2022-01-13 DIAGNOSIS — E1121 Type 2 diabetes mellitus with diabetic nephropathy: Secondary | ICD-10-CM

## 2022-01-13 DIAGNOSIS — Z7902 Long term (current) use of antithrombotics/antiplatelets: Secondary | ICD-10-CM | POA: Diagnosis not present

## 2022-01-13 DIAGNOSIS — W19XXXA Unspecified fall, initial encounter: Secondary | ICD-10-CM

## 2022-01-13 DIAGNOSIS — N1832 Chronic kidney disease, stage 3b: Secondary | ICD-10-CM | POA: Diagnosis not present

## 2022-01-13 DIAGNOSIS — E785 Hyperlipidemia, unspecified: Secondary | ICD-10-CM | POA: Diagnosis present

## 2022-01-13 DIAGNOSIS — M7989 Other specified soft tissue disorders: Secondary | ICD-10-CM | POA: Diagnosis not present

## 2022-01-13 DIAGNOSIS — I10 Essential (primary) hypertension: Secondary | ICD-10-CM | POA: Diagnosis not present

## 2022-01-13 DIAGNOSIS — I129 Hypertensive chronic kidney disease with stage 1 through stage 4 chronic kidney disease, or unspecified chronic kidney disease: Secondary | ICD-10-CM | POA: Diagnosis not present

## 2022-01-13 DIAGNOSIS — W0110XA Fall on same level from slipping, tripping and stumbling with subsequent striking against unspecified object, initial encounter: Secondary | ICD-10-CM | POA: Diagnosis not present

## 2022-01-13 DIAGNOSIS — Z743 Need for continuous supervision: Secondary | ICD-10-CM | POA: Diagnosis not present

## 2022-01-13 DIAGNOSIS — F4323 Adjustment disorder with mixed anxiety and depressed mood: Secondary | ICD-10-CM | POA: Diagnosis present

## 2022-01-13 DIAGNOSIS — S065XAA Traumatic subdural hemorrhage with loss of consciousness status unknown, initial encounter: Secondary | ICD-10-CM

## 2022-01-13 DIAGNOSIS — Z794 Long term (current) use of insulin: Secondary | ICD-10-CM | POA: Diagnosis not present

## 2022-01-13 DIAGNOSIS — R739 Hyperglycemia, unspecified: Secondary | ICD-10-CM | POA: Diagnosis not present

## 2022-01-13 DIAGNOSIS — S5002XA Contusion of left elbow, initial encounter: Secondary | ICD-10-CM | POA: Diagnosis not present

## 2022-01-13 DIAGNOSIS — S0990XA Unspecified injury of head, initial encounter: Secondary | ICD-10-CM | POA: Diagnosis present

## 2022-01-13 DIAGNOSIS — G319 Degenerative disease of nervous system, unspecified: Secondary | ICD-10-CM | POA: Diagnosis not present

## 2022-01-13 DIAGNOSIS — E1122 Type 2 diabetes mellitus with diabetic chronic kidney disease: Secondary | ICD-10-CM | POA: Insufficient documentation

## 2022-01-13 DIAGNOSIS — M25522 Pain in left elbow: Secondary | ICD-10-CM | POA: Diagnosis not present

## 2022-01-13 DIAGNOSIS — Z79899 Other long term (current) drug therapy: Secondary | ICD-10-CM | POA: Insufficient documentation

## 2022-01-13 DIAGNOSIS — G9389 Other specified disorders of brain: Secondary | ICD-10-CM | POA: Diagnosis not present

## 2022-01-13 DIAGNOSIS — R410 Disorientation, unspecified: Secondary | ICD-10-CM | POA: Diagnosis not present

## 2022-01-13 DIAGNOSIS — I6529 Occlusion and stenosis of unspecified carotid artery: Secondary | ICD-10-CM | POA: Diagnosis not present

## 2022-01-13 DIAGNOSIS — N183 Chronic kidney disease, stage 3 unspecified: Secondary | ICD-10-CM | POA: Diagnosis present

## 2022-01-13 LAB — CBC WITH DIFFERENTIAL/PLATELET
Abs Immature Granulocytes: 0.04 10*3/uL (ref 0.00–0.07)
Basophils Absolute: 0.1 10*3/uL (ref 0.0–0.1)
Basophils Relative: 1 %
Eosinophils Absolute: 0 10*3/uL (ref 0.0–0.5)
Eosinophils Relative: 0 %
HCT: 48.9 % (ref 39.0–52.0)
Hemoglobin: 16.9 g/dL (ref 13.0–17.0)
Immature Granulocytes: 0 %
Lymphocytes Relative: 5 %
Lymphs Abs: 0.5 10*3/uL — ABNORMAL LOW (ref 0.7–4.0)
MCH: 29.6 pg (ref 26.0–34.0)
MCHC: 34.6 g/dL (ref 30.0–36.0)
MCV: 85.8 fL (ref 80.0–100.0)
Monocytes Absolute: 0.5 10*3/uL (ref 0.1–1.0)
Monocytes Relative: 5 %
Neutro Abs: 9.5 10*3/uL — ABNORMAL HIGH (ref 1.7–7.7)
Neutrophils Relative %: 89 %
Platelets: 210 10*3/uL (ref 150–400)
RBC: 5.7 MIL/uL (ref 4.22–5.81)
RDW: 13.2 % (ref 11.5–15.5)
WBC: 10.6 10*3/uL — ABNORMAL HIGH (ref 4.0–10.5)
nRBC: 0 % (ref 0.0–0.2)

## 2022-01-13 LAB — I-STAT CHEM 8, ED
BUN: 41 mg/dL — ABNORMAL HIGH (ref 8–23)
BUN: 42 mg/dL — ABNORMAL HIGH (ref 8–23)
Calcium, Ion: 1.02 mmol/L — ABNORMAL LOW (ref 1.15–1.40)
Calcium, Ion: 1.01 mmol/L — ABNORMAL LOW (ref 1.15–1.40)
Chloride: 104 mmol/L (ref 98–111)
Chloride: 104 mmol/L (ref 98–111)
Creatinine, Ser: 1.6 mg/dL — ABNORMAL HIGH (ref 0.61–1.24)
Creatinine, Ser: 1.6 mg/dL — ABNORMAL HIGH (ref 0.61–1.24)
Glucose, Bld: 346 mg/dL — ABNORMAL HIGH (ref 70–99)
Glucose, Bld: 346 mg/dL — ABNORMAL HIGH (ref 70–99)
HCT: 52 % (ref 39.0–52.0)
HCT: 55 % — ABNORMAL HIGH (ref 39.0–52.0)
Hemoglobin: 17.7 g/dL — ABNORMAL HIGH (ref 13.0–17.0)
Hemoglobin: 18.7 g/dL — ABNORMAL HIGH (ref 13.0–17.0)
Potassium: 6.3 mmol/L (ref 3.5–5.1)
Potassium: 6.2 mmol/L — ABNORMAL HIGH (ref 3.5–5.1)
Sodium: 134 mmol/L — ABNORMAL LOW (ref 135–145)
Sodium: 134 mmol/L — ABNORMAL LOW (ref 135–145)
TCO2: 23 mmol/L (ref 22–32)
TCO2: 23 mmol/L (ref 22–32)

## 2022-01-13 LAB — COMPREHENSIVE METABOLIC PANEL
ALT: 36 U/L (ref 0–44)
AST: 22 U/L (ref 15–41)
Albumin: 3.5 g/dL (ref 3.5–5.0)
Alkaline Phosphatase: 138 U/L — ABNORMAL HIGH (ref 38–126)
Anion gap: 11 (ref 5–15)
BUN: 25 mg/dL — ABNORMAL HIGH (ref 8–23)
CO2: 21 mmol/L — ABNORMAL LOW (ref 22–32)
Calcium: 9.2 mg/dL (ref 8.9–10.3)
Chloride: 104 mmol/L (ref 98–111)
Creatinine, Ser: 1.55 mg/dL — ABNORMAL HIGH (ref 0.61–1.24)
GFR, Estimated: 49 mL/min — ABNORMAL LOW (ref >60)
Glucose, Bld: 312 mg/dL — ABNORMAL HIGH (ref 70–99)
Potassium: 4.4 mmol/L (ref 3.5–5.1)
Sodium: 136 mmol/L (ref 135–145)
Total Bilirubin: 0.8 mg/dL (ref 0.3–1.2)
Total Protein: 7 g/dL (ref 6.5–8.1)

## 2022-01-13 LAB — TYPE AND SCREEN
ABO/RH(D): O POS
Antibody Screen: NEGATIVE

## 2022-01-13 LAB — CBG MONITORING, ED: Glucose-Capillary: 166 mg/dL — ABNORMAL HIGH (ref 70–99)

## 2022-01-13 LAB — HEMOGLOBIN A1C
Hgb A1c MFr Bld: 9.1 % — ABNORMAL HIGH (ref 4.8–5.6)
Mean Plasma Glucose: 214.47 mg/dL

## 2022-01-13 LAB — PSA: Prostatic Specific Antigen: 7.26 ng/mL — ABNORMAL HIGH (ref 0.00–4.00)

## 2022-01-13 MED ORDER — LORAZEPAM 1 MG PO TABS
1.0000 mg | ORAL_TABLET | Freq: Two times a day (BID) | ORAL | Status: DC
  Administered 2022-01-13 – 2022-01-14 (×3): 1 mg via ORAL
  Filled 2022-01-13 (×3): qty 1

## 2022-01-13 MED ORDER — FINASTERIDE 5 MG PO TABS
5.0000 mg | ORAL_TABLET | Freq: Every day | ORAL | Status: DC
  Administered 2022-01-14: 5 mg via ORAL
  Filled 2022-01-13: qty 1

## 2022-01-13 MED ORDER — ATORVASTATIN CALCIUM 40 MG PO TABS
80.0000 mg | ORAL_TABLET | Freq: Every day | ORAL | Status: DC
  Administered 2022-01-14: 80 mg via ORAL
  Filled 2022-01-13: qty 2

## 2022-01-13 MED ORDER — SERTRALINE HCL 50 MG PO TABS
75.0000 mg | ORAL_TABLET | Freq: Every day | ORAL | Status: DC
  Administered 2022-01-14: 75 mg via ORAL
  Filled 2022-01-13: qty 2

## 2022-01-13 MED ORDER — ACETAMINOPHEN 650 MG RE SUPP: 650.0000 mg | Freq: Four times a day (QID) | RECTAL | Status: DC | PRN

## 2022-01-13 MED ORDER — ACETAMINOPHEN 325 MG PO TABS
650.0000 mg | ORAL_TABLET | Freq: Four times a day (QID) | ORAL | Status: DC | PRN
  Administered 2022-01-13: 650 mg via ORAL
  Filled 2022-01-13: qty 2

## 2022-01-13 MED ORDER — METOPROLOL SUCCINATE ER 25 MG PO TB24
25.0000 mg | ORAL_TABLET | Freq: Every day | ORAL | Status: DC
  Administered 2022-01-14: 25 mg via ORAL
  Filled 2022-01-13: qty 1

## 2022-01-13 MED ORDER — LORAZEPAM 1 MG PO TABS: 0.5000 mg | ORAL_TABLET | Freq: Every day | ORAL | Status: DC | PRN

## 2022-01-13 MED ORDER — DAPAGLIFLOZIN PROPANEDIOL 10 MG PO TABS
10.0000 mg | ORAL_TABLET | Freq: Every day | ORAL | Status: DC
  Administered 2022-01-14: 10 mg via ORAL
  Filled 2022-01-13: qty 1

## 2022-01-13 MED ORDER — INSULIN GLARGINE-YFGN 100 UNIT/ML ~~LOC~~ SOLN
20.0000 [IU] | Freq: Every day | SUBCUTANEOUS | Status: DC
  Administered 2022-01-14: 20 [IU] via SUBCUTANEOUS
  Filled 2022-01-13 (×2): qty 0.2

## 2022-01-13 MED ORDER — ONDANSETRON HCL 4 MG/2ML IJ SOLN: 4.0000 mg | Freq: Four times a day (QID) | INTRAMUSCULAR | Status: DC | PRN

## 2022-01-13 MED ORDER — ONDANSETRON HCL 4 MG PO TABS: 4.0000 mg | ORAL_TABLET | Freq: Four times a day (QID) | ORAL | Status: DC | PRN

## 2022-01-13 MED ORDER — AMLODIPINE BESYLATE 5 MG PO TABS
5.0000 mg | ORAL_TABLET | Freq: Every day | ORAL | Status: DC
  Administered 2022-01-14: 5 mg via ORAL
  Filled 2022-01-13: qty 1

## 2022-01-13 MED ORDER — CALCIUM GLUCONATE-NACL 1-0.675 GM/50ML-% IV SOLN: 1.0000 g | Freq: Once | INTRAVENOUS | Status: DC

## 2022-01-13 MED ORDER — INSULIN ASPART 100 UNIT/ML IJ SOLN
0.0000 [IU] | Freq: Three times a day (TID) | INTRAMUSCULAR | Status: DC
  Administered 2022-01-14: 5 [IU] via SUBCUTANEOUS
  Administered 2022-01-14: 3 [IU] via SUBCUTANEOUS

## 2022-01-13 MED ORDER — HYDROCODONE-ACETAMINOPHEN 5-325 MG PO TABS: 1.0000 | ORAL_TABLET | Freq: Four times a day (QID) | ORAL | Status: DC | PRN

## 2022-01-13 MED ORDER — LACTATED RINGERS IV BOLUS
1000.0000 mL | Freq: Once | INTRAVENOUS | Status: AC
  Administered 2022-01-13: 1000 mL via INTRAVENOUS

## 2022-01-13 NOTE — Assessment & Plan Note (Signed)
Edema/pain s/p fall Elbow xray wnl Check forearm xray Ice prn, norco prn for moderate pain

## 2022-01-13 NOTE — Assessment & Plan Note (Signed)
Baseline creatinine 1.5-.1.7, stable

## 2022-01-13 NOTE — ED Notes (Signed)
Pt back from CT at this time 

## 2022-01-13 NOTE — ED Notes (Signed)
Wolfe MD at bedside and provided pt w/ more soda

## 2022-01-13 NOTE — Assessment & Plan Note (Signed)
Continue zoloft and prn ativan

## 2022-01-13 NOTE — ED Notes (Signed)
Pt transfer to CT at this time

## 2022-01-13 NOTE — ED Notes (Signed)
Pt removed PIV, removed condom cath, removed all wires. Pt speaking about items not in room. Pt very disoriented.

## 2022-01-13 NOTE — Progress Notes (Signed)
   01/13/22 1340  Clinical Encounter Type  Visited With Patient not available  Visit Type Initial;Trauma  Referral From Nurse  Consult/Referral To Chaplain   Chaplain responded to a level two trauma. The patient [name] was under the care of the medical team. No family is present. If a chaplain is requested someone will respond.   Danice Goltz [facility]  [phone]

## 2022-01-13 NOTE — Assessment & Plan Note (Addendum)
66 year old male presenting to ED s/p fall at his SNF -obs to progressive -frequent neuro checks -hold DAPT (recent STEMI with DES in 10/2021) last took this AM  -neurosurgery consulted  -seizure precautions -repeat head CT per NS -f/u on DAPT re-initiation plans per NS -frequent falls and doesn't use walker like he should. This may be a difficult issue if he continues to not use his walker. ? Diabetic neuropathy also.  -PT

## 2022-01-13 NOTE — ED Triage Notes (Signed)
Pt BIB [facility] to  [facility] patient is from [place] fall and is on thinners, which impacted his head and left elbow. There is a deformity to left elbow.

## 2022-01-13 NOTE — ED Notes (Signed)
Lab call CMP was hemolyzed, want lab to be redraw

## 2022-01-13 NOTE — ED Notes (Signed)
Primary RN at bedside redrawing cmp

## 2022-01-13 NOTE — Consult Note (Signed)
Full consult to follow, pt with a ground level fall, on clopidogrel. [place] reviewed, shows a hyperdensity c/w aSDH along the falx, no convexity hemorrhage present. Given the clopidogrel, will need to hold the plavix for 1 week, observe the patient overnight and repeat the CT head in the morning prior to discharge. Will see the patient and update my note accordingly.

## 2022-01-13 NOTE — Assessment & Plan Note (Signed)
A1C of 11.0 in 08/2021  Repeat A1C continue long acting insulin at 20 units Continue farxiga  SSI and accuchecks qac/hs

## 2022-01-13 NOTE — ED Notes (Signed)
Pt threatening staff that he is going to leave because he hasn't gotten food as soon as he asked for it. Pt informed that a meal tray has been ordered. Neighboring RN provided pt w/ a soda.

## 2022-01-13 NOTE — Assessment & Plan Note (Addendum)
Recent NSTEMI in 10/2021 LHC with 99% stenosis of the Mid LAD s/p DES.  -Echo was also done while in the hospital which showed an EF of 70-75% and left ventricle that is hyperdynamic with regional wall abnormalities.  -currently on plavix and ASA x1 year  -clearly hold these in setting of SDH, unfortunately close to time of DES placement  Continue statin/beta blocker

## 2022-01-13 NOTE — ED Provider Notes (Signed)
[facility]  Provider Note  CSN: [id number] Arrival date & time: 01/13/22 1336  History Chief Complaint  Patient presents with   level 2 fall on thinners    [name] is a 66 y.o. male brought to the ED via EMS from LTCF for fall. He stood up after eating lunch was trying to use his rolling lunch tray for stability and it rolled away causing him to fall. He his his head and his L elbow. Did not have LOC. He is on Plavix for prior NSTEMI earlier this year. He is complaining of L elbow pain, worse with movement.   Home Medications Prior to Admission medications   Medication Sig Start Date End Date Taking? Authorizing Provider  amLODipine (NORVASC) 5 MG tablet Take 1 tablet (5 mg total) by mouth daily. 11/01/21   Nooruddin, Marlene Lard, MD  aspirin EC 81 MG tablet Take 1 tablet (81 mg total) by mouth daily. Swallow whole. 11/01/21   Nooruddin, Marlene Lard, MD  atorvastatin (LIPITOR) 80 MG tablet Take 1 tablet (80 mg total) by mouth daily. 11/01/21   Nooruddin, Marlene Lard, MD  dapagliflozin propanediol (FARXIGA) 10 MG TABS tablet Take 1 tablet (10 mg total) by mouth daily. 11/09/21   Nooruddin, Marlene Lard, MD  finasteride (PROSCAR) 5 MG tablet Take 1 tablet (5 mg total) by mouth daily. 11/01/21   Nooruddin, Marlene Lard, MD  insulin aspart (NOVOLOG) 100 UNIT/ML injection Inject 4 Units into the skin 3 (three) times daily with meals. 09/11/21   [place] Eric J, DO  insulin glargine-yfgn (SEMGLEE) 100 UNIT/ML injection Inject 0.2 mLs (20 Units total) into the skin daily. 09/12/21   [place] Eric J, DO  LORazepam (ATIVAN) 0.5 MG tablet Take 1 tablet (0.5 mg total) by mouth every 8 (eight) hours as needed for anxiety. 11/08/21   Riesa Pope, MD  metoprolol succinate (TOPROL-XL) 25 MG 24 hr tablet Take 1 tablet (25 mg total) by mouth daily. 10/31/21   Nooruddin, Marlene Lard, MD  sertraline (ZOLOFT) 50 MG tablet Take 1 tablet (50 mg total) by mouth daily. 09/12/21   [place] Donnamarie Poag, DO   ticagrelor (BRILINTA) 90 MG TABS tablet Take 1 tablet (90 mg total) by mouth 2 (two) times daily. 10/31/21   Nooruddin, Marlene Lard, MD     Allergies    Haldol [haloperidol]   Review of Systems   Review of Systems Please see HPI for pertinent positives and negatives  Physical Exam BP 127/89   Pulse 80   Temp 97.6 F (36.4 C) (Oral)   Resp 19   Ht '5\' 6"'$  (1.676 m)   Wt 75.8 kg   SpO2 95%   BMI 26.95 kg/m   Physical Exam Vitals and nursing note reviewed.  Constitutional:      Appearance: Normal appearance.  HENT:     Head: Normocephalic.     Comments: Abrasion L cheek    Nose: Nose normal.     Mouth/Throat:     Mouth: Mucous membranes are moist.  Eyes:     Extraocular Movements: Extraocular movements intact.     Conjunctiva/sclera: Conjunctivae normal.  Cardiovascular:     Rate and Rhythm: Normal rate.  Pulmonary:     Effort: Pulmonary effort is normal.     Breath sounds: Normal breath sounds.  Abdominal:     General: Abdomen is flat.     Palpations: Abdomen is soft.     Tenderness: There is no abdominal tenderness.  Musculoskeletal:  General: Swelling and tenderness present.     Cervical back: Neck supple. No tenderness (no midline tenderness, refused C-collar).     Comments: Bruising and swelling to L olecranon, tender to palpation, decreased ROM due to pain  Skin:    General: Skin is warm and dry.  Neurological:     General: No focal deficit present.     Mental Status: He is alert.  Psychiatric:        Mood and Affect: Mood normal.     ED Results / Procedures / Treatments   EKG EKG Interpretation  Date/Time:  Sunday January 13 2022 14:34:57 EDT Ventricular Rate:  78 PR Interval:  131 QRS Duration: 86 QT Interval:  378 QTC Calculation: 431 R Axis:   -6 Text Interpretation: Sinus rhythm Nonspecific T abnormalities, lateral leads No significant change since last tracing Confirmed by Calvert Cantor [phone]) on 01/13/2022 3:33:09  PM  Procedures Procedures  Medications Ordered in the ED Medications  calcium gluconate 1 g/ 50 mL sodium chloride IVPB (has no administration in time range)    Initial Impression and Plan  Patient here with mechanical fall, has head injury and L elbow injury. Otherwise looks stable on exam. Will check imaging for signs of injury. Hold off on labs at this time.   ED Course   Clinical Course as of 01/13/22 1544  Sun Jan 13, 2022  1414 I-stat apparently run by POC lab with elevated K. He has known CKD. Will be rechecked to confirm or may be due to hemolysis.  [CS]  5361 Recheck of I-stat again shows hyperkalemia. Will send to main lab for confirmation, check EKG and give a dose of CaGluc pending those results.  [CS]  4431 I personally viewed the images from radiology studies and agree with radiologist interpretation: Xray is neg for fracture. I discussed CT results with Radiologist, small SDH. Will consult Neurosurgery.   [CS]  5400 CBC is unremarkable.  [CS]  8676 Spoke with Marcello Moores, Neurosurgery, who recommends overnight observation and repeat CT in the AM.  [CS]  1950 RN now reports the patient's chemistry panel in the lab was hemolyzed and that both prior I-stats were run on the same blood sample and not on a redraw. I suspect this is a lab error and not true hyperkalemia. Will hold on CaGluc for now. Care of the patient will be signed out to Dr. Regenia Skeeter at the change of shift.  [CS]    Clinical Course User Index [CS] Truddie Hidden, MD     MDM Rules/Calculators/A&P Medical Decision Making Problems Addressed: Fall, initial encounter: acute illness or injury SDH (subdural hematoma) [facility]): acute illness or injury  Amount and/or Complexity of Data Reviewed Labs: ordered. Decision-making details documented in ED Course. Radiology: ordered and independent interpretation performed. Decision-making details documented in ED Course. ECG/medicine tests: ordered and independent  interpretation performed. Decision-making details documented in ED Course.  Risk Prescription drug management. Decision regarding hospitalization.    Final Clinical Impression(s) / ED Diagnoses Final diagnoses:  Fall, initial encounter  SDH (subdural hematoma) ([place])    Rx / DC Orders ED Discharge Orders     None        Truddie Hidden, MD 01/13/22 1544

## 2022-01-13 NOTE — Assessment & Plan Note (Signed)
--  Continue lipitor 80mg  ?

## 2022-01-13 NOTE — ED Notes (Signed)
PIV restarted, pt refused condom cath placement, pt's wires reconnected, switched to hospital bed for comfort Pt c/o headache.

## 2022-01-13 NOTE — Assessment & Plan Note (Addendum)
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 -Was to have had a prostate MRI and appears has not followed up on this. Never went to f/u after hospitalization as recommended -repeat PSA today

## 2022-01-13 NOTE — Progress Notes (Signed)
Trauma Response Nurse Documentation   [name] is a 66 y.o. male arriving to [facility] ED via EMS  On Brilinta (ticagrelor) 90 mg bid. Trauma was activated as a Level 2 by ED Charge RN based on the following trauma criteria Elderly patients > 65 with head trauma on anti-coagulation (excluding ASA). Trauma team at the bedside on patient arrival.   Patient cleared for CT by Dr. Karle Starch. Pt transported to CT with trauma response nurse present to monitor. RN remained with the patient throughout their absence from the department for clinical observation.   GCS 14.  History   Past Medical History:  Diagnosis Date   Acute kidney injury superimposed on chronic kidney disease ([place]) 02/10/2020   Acute on chronic kidney failure (HCC) 09/28/2019   Closed nondisplaced spiral fracture of shaft of right tibia    Community acquired pneumonia 01/77/9390   Complicated urinary tract infection 09/06/2016   Depression    Diabetes mellitus without complication (HCC)    GERD (gastroesophageal reflux disease)    Gout    right foot   Hemorrhoids    Hypertension    Macular degeneration    Microcytic anemia    PNA (pneumonia) 03/26/2021   Prostate cancer ([place]) 08/2009   Tubular adenoma      Past Surgical History:  Procedure Laterality Date   CORONARY STENT INTERVENTION N/A 10/29/2021   Procedure: CORONARY STENT INTERVENTION;  Surgeon: Martinique, Peter M, MD;  Location: [facility];  Service: Cardiovascular;  Laterality: N/A;  Mid LAD   CYSTOSCOPY WITH RETROGRADE PYELOGRAM, URETEROSCOPY AND STENT PLACEMENT Right 07/31/2016   Procedure: CYSTOSCOPY WITH RIGHT  RETROGRADE PYELOGRAM, URETEROSCOPY;  Surgeon: [name], MD;  Location: [facility];  Service: Urology;  Laterality: Right;   INGUINAL HERNIA REPAIR     INTRAVASCULAR PRESSURE WIRE/FFR STUDY N/A 11/07/2021   Procedure: INTRAVASCULAR PRESSURE WIRE/FFR STUDY;  Surgeon: Early Osmond, MD;  Location: [facility];  Service:  Cardiovascular;  Laterality: N/A;   Left ankle joint fusion  1981   LEFT HEART CATH AND CORONARY ANGIOGRAPHY N/A 10/29/2021   Procedure: LEFT HEART CATH AND CORONARY ANGIOGRAPHY;  Surgeon: Martinique, Peter M, MD;  Location: [facility];  Service: Cardiovascular;  Laterality: N/A;   LEFT HEART CATH AND CORONARY ANGIOGRAPHY N/A 11/07/2021   Procedure: LEFT HEART CATH AND CORONARY ANGIOGRAPHY;  Surgeon: Early Osmond, MD;  Location: [facility] CV LAB;  Service: Cardiovascular;  Laterality: N/A;   PROSTATE BIOPSY     x 2   URETERAL REIMPLANTION  07/31/2016   Procedure: URETERAL REIMPLANT, right boari flap, right psoas hitch;  Surgeon: [name], MD;  Location: [facility];  Service: Urology;;       Initial Focused Assessment (If applicable, or please see trauma documentation):   CT's Completed:   CT Head   Interventions:   Plan for disposition:  Admission to [place] completed:  Neurosurgeon at 1508.  Event Summary:  Patient brought to the ED via EMS from LTCF for fall. He stood up after eating lunch was trying to use his rolling lunch tray for stability and it rolled away causing him to fall. He his his head and his L elbow. Did not have LOC. He is on Plavix for prior NSTEMI earlier this year. He is complaining of L elbow pain, worse with movement. Pt transported to CT, positive for SDH, NeuroSx consulted.     Bedside handoff with ED RN Raquel Sarna.  [name] L [name]  Trauma Response RN  Please call TRN at [phone] for further assistance.

## 2022-01-13 NOTE — ED Notes (Signed)
Called lab to have cbc and cmp added onto previous collection

## 2022-01-13 NOTE — Assessment & Plan Note (Signed)
Well controlled Continue norvasc '5mg'$  daily and metoprolol -xl '25mg'$  daily

## 2022-01-13 NOTE — H&P (Signed)
History and Physical    Patient: [name] DOB: July 05, 1955 DOA: 01/13/2022 DOS: the patient was seen and examined on 01/13/2022 PCP: Pcp, No  Patient coming from: SNF - uses walker and cane    Chief Complaint: fall  HPI: [name] is a 66 y.o. male with medical history significant of  Type 2 DM with diabetic nephropathy, essential hypertension, CKD Stage 3, gout, hx of prostate cancer, GERD who presented to ED after a fall. He has history of frequent falls.  He is a poor historian. Today at lunch he was trying to use a rolling lunch tray for stability and rolled away causing him to fall. He his his head and elbow.  He also fell yesterday evening. He fell off the bed. He was watching TV and rolled over and hit the floor. He thinks he hit his head, but didn't come to the hospital last night. He is on aspirin and plavix after DES to 99% stenosed mid LAD. He denies any chest pain, dizziness or lightheadedness prior to the fall. He states his head hurt pretty bad after this fall. He had no open wounds  Denies any fever/chills, vision changes/headaches, chest pain or palpitations, shortness of breath or cough, abdominal pain, N/V/D, dysuria or leg swelling.    He does not smoke or drink alcohol.    ER Course:  vitals: afebrile, bp: 123/68, HR: 74, RR: 18 oxygen: 95% RA Pertinent labs: wbc: 10.6, BUN: 25, creatinine: 1.55,  CT head: acute SDH along the falx measuring up to 61m in greatest thickness with mild associated mass effect. No midline shift.  In ED: NS consulted. TRH asked to admit.     Review of Systems: As mentioned in the history of present illness. All other systems reviewed and are negative. Past Medical History:  Diagnosis Date   Acute kidney [place] superimposed [place] chronic kidney disease [place] 02/10/2020   Acute on chronic kidney failure (HCC) 09/28/2019   Closed nondisplaced spiral fracture of shaft of right tibia    Community acquired pneumonia  061/95/0932  Complicated urinary tract infection 09/06/2016   Depression    Diabetes mellitus without complication (HCC)    GERD (gastroesophageal reflux disease)    Gout    right foot   Hemorrhoids    Hypertension    Macular degeneration    Microcytic anemia    PNA (pneumonia) 03/26/2021   Prostate cancer ([place] 08/2009   Tubular adenoma    Past Surgical History:  Procedure Laterality Date   CORONARY STENT INTERVENTION N/A 10/29/2021   Procedure: CORONARY STENT INTERVENTION;  Surgeon: JMartinique Peter M, MD;  [facility];  Service: Cardiovascular;  Laterality: N/A;  Mid LAD   CYSTOSCOPY WITH RETROGRADE PYELOGRAM, URETEROSCOPY AND STENT PLACEMENT Right 07/31/2016   Procedure: CYSTOSCOPY WITH RIGHT  RETROGRADE PYELOGRAM, URETEROSCOPY;  Surgeon: HArdis Hughs MD;  [facility];  Service: Urology;  Laterality: Right;   INGUINAL HERNIA REPAIR     INTRAVASCULAR PRESSURE WIRE/FFR STUDY N/A 11/07/2021   Procedure: INTRAVASCULAR PRESSURE WIRE/FFR STUDY;  Surgeon: TEarly Osmond MD;  [facility];  Service: Cardiovascular;  Laterality: N/A;   Left ankle joint fusion  1981   LEFT HEART CATH AND CORONARY ANGIOGRAPHY N/A 10/29/2021   Procedure: LEFT HEART CATH AND CORONARY ANGIOGRAPHY;  Surgeon: JMartinique Peter M, MD;  [place] [facility];  Service: Cardiovascular;  Laterality: N/A;   LEFT HEART CATH AND CORONARY ANGIOGRAPHY N/A 11/07/2021  Procedure: LEFT HEART CATH AND CORONARY ANGIOGRAPHY;  Surgeon: Early Osmond, MD;  Location: [facility];  Service: Cardiovascular;  Laterality: N/A;   PROSTATE BIOPSY     x 2   URETERAL REIMPLANTION  07/31/2016   Procedure: URETERAL REIMPLANT, right boari flap, right psoas hitch;  Surgeon: [name], MD;  [facility];  Service: Urology;;   Social History:  reports that he has never smoked. He has never used smokeless tobacco. He reports that he does not currently use alcohol. He reports  that he does not use drugs.  Allergies  Allergen Reactions   Haldol [Haloperidol] Other (See Comments)    Per family cause life threatening complications to patient Per Pt, it causes "complications with my mouth"    Family History  Problem Relation Age of Onset   Lung cancer Mother        Deceased   Pancreatic cancer Father    Heart disease Father        Deceased   Throat cancer Brother    Lung cancer Maternal Uncle        nephew   Diabetes Other     Prior to Admission medications   Medication Sig Start Date End Date Taking? Authorizing Provider  amLODipine (NORVASC) 5 MG tablet Take 1 tablet (5 mg total) by mouth daily. 11/01/21   Nooruddin, Marlene Lard, MD  aspirin EC 81 MG tablet Take 1 tablet (81 mg total) by mouth daily. Swallow whole. 11/01/21   Nooruddin, Marlene Lard, MD  atorvastatin (LIPITOR) 80 MG tablet Take 1 tablet (80 mg total) by mouth daily. 11/01/21   Nooruddin, Marlene Lard, MD  dapagliflozin propanediol (FARXIGA) 10 MG TABS tablet Take 1 tablet (10 mg total) by mouth daily. 11/09/21   Nooruddin, Marlene Lard, MD  finasteride (PROSCAR) 5 MG tablet Take 1 tablet (5 mg total) by mouth daily. 11/01/21   Nooruddin, Marlene Lard, MD  insulin aspart (NOVOLOG) 100 UNIT/ML injection Inject 4 Units into the skin 3 (three) times daily with meals. 09/11/21   [place] Eric J, DO  insulin glargine-yfgn (SEMGLEE) 100 UNIT/ML injection Inject 0.2 mLs (20 Units total) into the skin daily. 09/12/21   [place] Eric J, DO  LORazepam (ATIVAN) 0.5 MG tablet Take 1 tablet (0.5 mg total) by mouth every 8 (eight) hours as needed for anxiety. 11/08/21   Riesa Pope, MD  metoprolol succinate (TOPROL-XL) 25 MG 24 hr tablet Take 1 tablet (25 mg total) by mouth daily. 10/31/21   Nooruddin, Marlene Lard, MD  sertraline (ZOLOFT) 50 MG tablet Take 1 tablet (50 mg total) by mouth daily. 09/12/21   [place] Donnamarie Poag, DO  ticagrelor (BRILINTA) 90 MG TABS tablet Take 1 tablet (90 mg total) by mouth 2 (two) times daily. 10/31/21   Drucie Opitz,  MD    Physical Exam: Vitals:   01/13/22 1645 01/13/22 1700 01/13/22 1730 01/13/22 1745  BP: (!) 153/89 (!) 139/93 (!) 150/97 134/79  Pulse: 80 79 78 80  Resp: '18 20 19 19  '$ Temp:      TempSrc:      SpO2: 97% 96% 97% 96%  Weight:      Height:       General:  Appears calm and comfortable and is in NAD.  Eyes:  PERRL, EOMI, normal lids, iris ENT:  grossly normal hearing, lips & tongue, mmm; poor dentition  Neck:  no LAD, masses or thyromegaly; no carotid bruits Cardiovascular:  RRR, no m/r/g. No LE edema.  Respiratory:   CTA bilaterally with  no wheezes/rales/rhonchi.  Normal respiratory effort. Abdomen:  soft, NT, ND, NABS Back:   normal alignment, no CVAT Skin:  no rash or induration seen on limited exam Musculoskeletal:  grossly normal tone BUE/BLE, good ROM, no bony abnormality. TTP over left elbow, distal forearm. Edema. Can extend 180 degrees, radial pulse intact.  Lower extremity:  No LE edema.  Limited foot exam with no ulcerations.  2+ distal pulses. Psychiatric:  grossly normal mood and affect, speech fluent and appropriate, AOx3 Neurologic:  CN 2-12 grossly intact, moves all extremities in coordinated fashion, sensation intact. TD with lip smacking?    Radiological Exams on Admission: Independently reviewed - see discussion in A/P where applicable  CT Head Wo Contrast  Result Date: 01/13/2022 CLINICAL DATA:  Head trauma, patient is on blood thinners. EXAM: CT HEAD WITHOUT CONTRAST TECHNIQUE: Contiguous axial images were obtained from the base of the skull through the vertex without intravenous contrast. RADIATION DOSE REDUCTION: This exam was performed according to the departmental dose-optimization program which includes automated exposure control, adjustment of the mA and/or kV according to patient size and/or use of iterative reconstruction technique. COMPARISON:  CT head dated 09/24/2021. FINDINGS: Brain: Acute blood products are seen along the falx, measuring up to 5 mm  in greatest thickness, representing an acute subdural hematoma. There is mild associated mass effect. No midline shift. There is slight thickening along the tentorium which likely reflects layering of blood products. No definite subarachnoid or intraventricular extension of blood products is identified. The basilar cisterns are patent. There is mild cerebral volume loss with associated ex vacuo dilatation. Periventricular white matter hypoattenuation likely represents chronic small vessel ischemic disease. Vascular: There are vascular calcifications in the carotid siphons. Skull: Normal. Negative for fracture or focal lesion. Sinuses/Orbits: No acute finding. Other: None. IMPRESSION: Acute subdural hematoma along the falx measuring up to 5 mm in greatest thickness with mild associated mass effect. No midline shift. These results were called by telephone at the time of interpretation on 01/13/2022 at 2:34 pm to provider [facility] , who verbally acknowledged these results. Electronically Signed   By: Zerita Boers M.D.   On: 01/13/2022 14:35   DG Elbow Complete Left  Result Date: 01/13/2022 CLINICAL DATA:  Left elbow contusion EXAM: LEFT ELBOW - COMPLETE 3+ VIEW COMPARISON:  None Available. FINDINGS: There is no evidence of fracture, dislocation, or joint effusion. There is no evidence of arthropathy or other focal bone abnormality. Soft tissues are unremarkable. IMPRESSION: Negative. Electronically Signed   By: Zerita Boers M.D.   On: 01/13/2022 14:16    EKG: Independently reviewed.  NSR with rate 78; nonspecific ST changes with no evidence of acute ischemia   Labs on Admission: I have personally reviewed the available labs and imaging studies at the time of the admission.  Pertinent labs:   wbc: 10.6,  BUN: 25,  creatinine: 1.55,  Assessment and Plan: Principal Problem:   Acute subdural hematoma (HCC) Active Problems:   CAD S/P percutaneous coronary angioplasty   Left elbow pain    Essential hypertension   Type 2 diabetes mellitus with diabetic nephropathy, without long-term current use of insulin (HCC)   Chronic kidney disease (CKD), stage III (moderate) (HCC)   Adjustment disorder with mixed anxiety and depressed mood   HLD (hyperlipidemia)   History of prostate cancer with recurrent urinary obstruction     Assessment and Plan: * Acute subdural hematoma ([place]) 66 year old male presenting to ED s/p fall at his SNF -obs  to progressive -frequent neuro checks -hold DAPT (recent STEMI with DES in 10/2021) last took this AM  -neurosurgery consulted  -seizure precautions -repeat head CT per NS -f/u on DAPT re-initiation plans per NS -frequent falls and doesn't use walker like he should. This may be a difficult issue if he continues to not use his walker. ? Diabetic neuropathy also.  -PT   Left elbow pain Edema/pain s/p fall Elbow xray wnl Check forearm xray Ice prn, norco prn for moderate pain   CAD S/P percutaneous coronary angioplasty Recent NSTEMI in 10/2021 LHC with 99% stenosis of the Mid LAD s/p DES.  -Echo was also done while in the hospital which showed an EF of 70-75% and left ventricle that is hyperdynamic with regional wall abnormalities.  -currently on plavix and ASA x1 year  -clearly hold these in setting of SDH, unfortunately close to time of DES placement  Continue statin/beta blocker      Essential hypertension Well controlled Continue norvasc '5mg'$  daily and metoprolol -xl '25mg'$  daily   Type 2 diabetes mellitus with diabetic nephropathy, without long-term current use of insulin (HCC) A1C of 11.0 in 08/2021  Repeat A1C continue long acting insulin at 20 units Continue farxiga  SSI and accuchecks qac/hs   Chronic kidney disease (CKD), stage III (moderate) (HCC) Baseline creatinine 1.5-.1.7, stable   Adjustment disorder with mixed anxiety and depressed mood Continue zoloft and prn ativan   HLD (hyperlipidemia) Continue lipitor '80mg'$     History of prostate cancer with recurrent urinary obstruction  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 -Was to have had a prostate MRI and appears has not followed up on this. Never went to f/u after hospitalization as recommended -repeat PSA today      Advance Care Planning:   Code Status: Full Code   Consults: neurosurgery/PT  DVT Prophylaxis: SCDs   Family Communication: sister   Severity of Illness: The appropriate patient status for this patient is OBSERVATION. Observation status is judged to be reasonable and necessary in order to provide the required intensity of service to ensure the patient's safety. The patient's presenting symptoms, physical exam findings, and initial radiographic and laboratory data in the context of their medical condition is felt to place them at decreased risk for further clinical deterioration. Furthermore, it is anticipated that the patient will be medically stable for discharge from the hospital within 2 midnights of admission.   Author: Orma Flaming, MD 01/13/2022 7:02 PM  For on call review [email or web address].

## 2022-01-13 NOTE — Progress Notes (Signed)
Orthopedic Tech Progress Note Patient Details:  [name] September 09, 1955 [id number]  Level 2 trauma Patient ID: [name], male   DOB: [birth year], 66 y.o.   MRN: [id number]  [name] 01/13/2022, 3:51 PM

## 2022-01-13 NOTE — ED Provider Notes (Signed)
Care transferred to me.  Potassium is normal.  CKD unchanged.  He is hyperglycemic but no DKA.  Per prior plan will admit to the hospitalist, discussed with Dr. Rogers Blocker. Otherwise is well appearing at this time.   Sherwood Gambler, MD 01/13/22 [phone]

## 2022-01-13 NOTE — ED Notes (Signed)
Pt called out, stated he had urinated on himself. Pt cleaned, linen and gown changed, condom cath applied, pt consented to condom cath.

## 2022-01-14 ENCOUNTER — Observation Stay (HOSPITAL_COMMUNITY): Payer: Medicare Other

## 2022-01-14 DIAGNOSIS — I10 Essential (primary) hypertension: Secondary | ICD-10-CM | POA: Diagnosis not present

## 2022-01-14 DIAGNOSIS — I2510 Atherosclerotic heart disease of native coronary artery without angina pectoris: Secondary | ICD-10-CM

## 2022-01-14 DIAGNOSIS — W19XXXA Unspecified fall, initial encounter: Secondary | ICD-10-CM | POA: Diagnosis not present

## 2022-01-14 DIAGNOSIS — S065X0A Traumatic subdural hemorrhage without loss of consciousness, initial encounter: Secondary | ICD-10-CM | POA: Diagnosis not present

## 2022-01-14 DIAGNOSIS — S065XAA Traumatic subdural hemorrhage with loss of consciousness status unknown, initial encounter: Secondary | ICD-10-CM | POA: Diagnosis not present

## 2022-01-14 DIAGNOSIS — I6523 Occlusion and stenosis of bilateral carotid arteries: Secondary | ICD-10-CM | POA: Diagnosis not present

## 2022-01-14 DIAGNOSIS — Z9861 Coronary angioplasty status: Secondary | ICD-10-CM | POA: Diagnosis not present

## 2022-01-14 DIAGNOSIS — I672 Cerebral atherosclerosis: Secondary | ICD-10-CM | POA: Diagnosis not present

## 2022-01-14 DIAGNOSIS — I6200 Nontraumatic subdural hemorrhage, unspecified: Secondary | ICD-10-CM | POA: Diagnosis not present

## 2022-01-14 LAB — CBC
HCT: 52.5 % — ABNORMAL HIGH (ref 39.0–52.0)
Hemoglobin: 17.1 g/dL — ABNORMAL HIGH (ref 13.0–17.0)
MCH: 28.6 pg (ref 26.0–34.0)
MCHC: 32.6 g/dL (ref 30.0–36.0)
MCV: 87.8 fL (ref 80.0–100.0)
Platelets: 165 10*3/uL (ref 150–400)
RBC: 5.98 MIL/uL — ABNORMAL HIGH (ref 4.22–5.81)
RDW: 13.5 % (ref 11.5–15.5)
WBC: 7.2 10*3/uL (ref 4.0–10.5)
nRBC: 0 % (ref 0.0–0.2)

## 2022-01-14 LAB — CBG MONITORING, ED
Glucose-Capillary: 210 mg/dL — ABNORMAL HIGH (ref 70–99)
Glucose-Capillary: 184 mg/dL — ABNORMAL HIGH (ref 70–99)
Glucose-Capillary: 204 mg/dL — ABNORMAL HIGH (ref 70–99)
Glucose-Capillary: 114 mg/dL — ABNORMAL HIGH (ref 70–99)
Glucose-Capillary: 117 mg/dL — ABNORMAL HIGH (ref 70–99)

## 2022-01-14 LAB — BASIC METABOLIC PANEL
Anion gap: 12 (ref 5–15)
BUN: 22 mg/dL (ref 8–23)
CO2: 20 mmol/L — ABNORMAL LOW (ref 22–32)
Calcium: 9.2 mg/dL (ref 8.9–10.3)
Chloride: 105 mmol/L (ref 98–111)
Creatinine, Ser: 1.62 mg/dL — ABNORMAL HIGH (ref 0.61–1.24)
GFR, Estimated: 46 mL/min — ABNORMAL LOW (ref >60)
Glucose, Bld: 222 mg/dL — ABNORMAL HIGH (ref 70–99)
Potassium: 4.1 mmol/L (ref 3.5–5.1)
Sodium: 137 mmol/L (ref 135–145)

## 2022-01-14 MED ORDER — ASPIRIN 81 MG PO TBEC
81.0000 mg | DELAYED_RELEASE_TABLET | Freq: Every day | ORAL | Status: DC
  Administered 2022-01-14: 81 mg via ORAL
  Filled 2022-01-14: qty 1

## 2022-01-14 MED ORDER — CLOPIDOGREL BISULFATE 75 MG PO TABS
75.0000 mg | ORAL_TABLET | Freq: Every day | ORAL | Status: DC
  Administered 2022-01-14: 75 mg via ORAL
  Filled 2022-01-14: qty 1

## 2022-01-14 NOTE — Evaluation (Signed)
Physical Therapy Evaluation Patient Details Name: [name] MRN: [id number] DOB: Jul 28, 1955 Today's Date: 01/14/2022  History of Present Illness  Pt is 66 yo male admitted 01/13/22 after fall and admitted with parafalcine subdural hematoma.  Pt with hx of frequent falls, DM2, HTN, CKD, gout, prostate CA, GERD  Clinical Impression  Pt admitted with above diagnosis. Pt is poor historian and reports from home with aides to assist intermittently and could ambulate; however, per chart review pt from long term care.  Today, he required mod A for bed mobility, mod A EOB balance initially, and mod A to stand.  Pt was unable to take any steps.  Pt with hx of recent falls and will benefit from PT to address mobility. Pt currently with functional limitations due to the deficits listed below (see PT Problem List). Pt will benefit from skilled PT to increase their independence and safety with mobility to allow discharge to the venue listed below.          Recommendations for follow up therapy are one component of a multi-disciplinary discharge planning process, led by the attending physician.  Recommendations may be updated based on patient status, additional functional criteria and insurance authorization.  Follow Up Recommendations Skilled nursing-short term rehab (<3 hours/day) Can patient physically be transported by private vehicle: No    Assistance Recommended at Discharge Frequent or constant Supervision/Assistance  Patient can return home with the following  A lot of help with walking and/or transfers;A lot of help with bathing/dressing/bathroom;Assistance with feeding;Assistance with cooking/housework    Equipment Recommendations None recommended by PT  Recommendations for Other Services       Functional Status Assessment Patient has had a recent decline in their functional status and demonstrates the ability to make significant improvements in function in a reasonable and predictable  amount of time.     Precautions / Restrictions Precautions Precautions: Fall      Mobility  Bed Mobility Overal bed mobility: Needs Assistance Bed Mobility: Supine to Sit, Sit to Supine     Supine to sit: Mod assist Sit to supine: +2 for physical assistance, Mod assist   General bed mobility comments: Pt able to get legs off bed but requiring mod A to lift trunk and scoot; mod x 2 to return to supine and reposition    Transfers Overall transfer level: Needs assistance Equipment used: Rolling walker (2 wheels) Transfers: Sit to/from Stand Sit to Stand: From elevated surface, Mod assist           General transfer comment: Mod A to stand (after working on leaning forward)    Ambulation/Gait             Pre-gait activities: Pt weight shifting but unable to move for steps; requiring UE support and mod A    Stairs            Wheelchair Mobility    Modified Rankin (Stroke Patients Only)       Balance Overall balance assessment: Needs assistance Sitting-balance support: Bilateral upper extremity supported Sitting balance-Leahy Scale: Poor Sitting balance - Comments: Pt initially with heavy posterior lean requiring mod A -improved when chair placed in front and pt leaning on armrest (close min guard).  When chair removed pt with posterior or R lean requiring min-mod A.  Tried using bed rails but still leans R.  EOB 10 mins   Standing balance support: Bilateral upper extremity supported Standing balance-Leahy Scale: Poor Standing balance comment: Requiring UE support and mod A  Pertinent Vitals/Pain Pain Assessment Pain Assessment: No/denies pain Faces Pain Scale: Hurts a little bit Pain Location: L ankle and R side Pain Descriptors / Indicators: Discomfort Pain Intervention(s): Limited activity within patient's tolerance, Monitored during session    Home Living Family/patient expects to be discharged to::  Skilled nursing facility Living Arrangements: Alone Available Help at Discharge: Personal care attendant;Available PRN/intermittently (aide 3 x week for couple hours a day) Type of Home: Apartment Home Access: Stairs to enter Entrance Stairs-Rails: None Entrance Stairs-Number of Steps: 2   Home Layout: One level Home Equipment: Conservation officer, nature (2 wheels);Crutches Additional Comments: Pt is poor historian.  Reported above prior level of function/home environment; however, upon further chart review found that pt is from long term care    Prior Function Prior Level of Function : History of Falls (last six months)             Mobility Comments: Poor historian but reports: Pt ambulated without AD; Could ambulate short community distances ADLs Comments: Reports assist with ADLs     Hand Dominance        Extremity/Trunk Assessment   Upper Extremity Assessment Upper Extremity Assessment: Difficult to assess due to impaired cognition;RUE deficits/detail;LUE deficits/detail RUE Deficits / Details: Difficulty following commands; demonstrating grossly 2/5 thoughout; appeared equal bilaterally LUE Deficits / Details: Difficulty following commands; demonstrating grossly 2/5 thoughout; appeared equal bilaterally    Lower Extremity Assessment Lower Extremity Assessment: Difficult to assess due to impaired cognition;LLE deficits/detail;RLE deficits/detail RLE Deficits / Details: Difficulty following commands; demonstrating grossly 2/5 thoughout; appeared equal bilaterally LLE Deficits / Details: Difficulty following commands; demonstrating grossly 2/5 thoughout; appeared equal bilaterally    Cervical / Trunk Assessment Cervical / Trunk Assessment: Kyphotic  Communication   Communication: No difficulties  Cognition Arousal/Alertness: Awake/alert Behavior During Therapy: WFL for tasks assessed/performed Overall Cognitive Status: No family/caregiver present to determine baseline cognitive  functioning                                 General Comments: Pt oriented to self and place only; followed about 50% of simple commands; unable to provide accurate history        General Comments General comments (skin integrity, edema, etc.): VSS    Exercises     Assessment/Plan    PT Assessment Patient needs continued PT services  PT Problem List Decreased strength;Decreased mobility;Decreased range of motion;Decreased activity tolerance;Decreased balance;Decreased knowledge of use of DME;Decreased cognition;Decreased knowledge of precautions;Decreased safety awareness       PT Treatment Interventions DME instruction;Therapeutic exercise;Gait training;Balance training;Neuromuscular re-education;Functional mobility training;Therapeutic activities;Patient/family education;Cognitive remediation    PT Goals (Current goals can be found in the Care Plan section)  Acute Rehab PT Goals Patient Stated Goal: unable to state PT Goal Formulation: Patient unable to participate in goal setting    Frequency Min 2X/week     Co-evaluation               AM-PAC PT "6 Clicks" Mobility  Outcome Measure Help needed turning from your back to your side while in a flat bed without using bedrails?: A Lot Help needed moving from lying on your back to sitting on the side of a flat bed without using bedrails?: A Lot Help needed moving to and from a bed to a chair (including a wheelchair)?: Total Help needed standing up from a chair using your arms (e.g., wheelchair or bedside chair)?: Total Help needed to  walk in hospital room?: Total Help needed climbing 3-5 steps with a railing? : Total 6 Click Score: 8    End of Session Equipment Utilized During Treatment: Gait belt Activity Tolerance: Patient tolerated treatment well Patient left: in bed;with call bell/phone within reach;with bed alarm set Nurse Communication: Mobility status (condom cath off - tech into assist and  aware) PT Visit Diagnosis: Other abnormalities of gait and mobility (R26.89);History of falling (Z91.81)    Time: 5844-1712 PT Time Calculation (min) (ACUTE ONLY): 28 min   Charges:   PT Evaluation $PT Eval Low Complexity: 1 Low PT Treatments $Therapeutic Activity: 8-22 mins        Abran Richard, PT Acute Rehab [facility] [phone]   Karlton Lemon 01/14/2022, 1:22 PM

## 2022-01-14 NOTE — Consult Note (Signed)
Neurosurgery Consultation  Reason for Consult: Subdural hematoma Referring Physician: [place] Pink  CC: Fall  HPI: This is a 66 y.o. man w/ h/o CKD3, recent DES placed 2.91moago for NSTEMI with chest pain and bradycardia. He now presents after a fall, reports he has been having multiple falls, he is not sure why, can't describe the conditions that occur prior to, during, or after a fall. He denies any new weakness, numbness, or parasthesias, no recent change in bowel or bladder function. Was discharge on ticagrelor but appears to have transitioned to clopidogrel now.    ROS: A 14 point ROS was performed and is negative except as noted in the HPI.   PMHx:  Past Medical History:  Diagnosis Date   Acute kidney injury superimposed on chronic kidney disease ([facility] 02/10/2020   Acute on chronic kidney failure (HCC) 09/28/2019   Closed nondisplaced spiral fracture of shaft of right tibia    Community acquired pneumonia 052/84/1324  Complicated urinary tract infection 09/06/2016   Depression    Diabetes mellitus without complication (HCC)    GERD (gastroesophageal reflux disease)    Gout    right foot   Hemorrhoids    Hypertension    Macular degeneration    Microcytic anemia    PNA (pneumonia) 03/26/2021   Prostate cancer [place] 08/2009   Tubular adenoma    FamHx:  Family History  Problem Relation Age of Onset   Lung cancer Mother        Deceased   Pancreatic cancer Father    Heart disease Father        Deceased   Throat cancer Brother    Lung cancer Maternal Uncle        nephew   Diabetes Other    SocHx:  reports that he has never smoked. He has never used smokeless tobacco. He reports that he does not currently use alcohol. He reports that he does not use drugs.  Exam: Vital signs in last 24 hours: Temp:  [97.6 F (36.4 C)-99.8 F (37.7 C)] 99.8 F (37.7 C) (10/30 1152) Pulse Rate:  [74-105] 96 (10/30 1152) Resp:  [16-24] 21 (10/30 1152) BP: (116-177)/(68-107) 150/94  (10/30 1152) SpO2:  [92 %-98 %] 96 % (10/30 1152) Weight:  [75.8 kg] 75.8 kg (10/29 1345) General: Awake, alert, cooperative, lying in bed in NAD Head: Normocephalic and atruamatic HEENT: Neck supple Pulmonary: breathing room air comfortably, no evidence of increased work of breathing Cardiac: RRR Abdomen: S NT ND Extremities: Warm and well perfused x4, some scattered small chronic appearing wounds Neuro: AOx3, PERRL, EOMI, FS Strength 5/5 x4, SILTx4, no drift   Assessment and Plan: 66y.o. man s/p fall with head trauma, on clopidogrel for recent NSTEMI with DES. CBoydpersonally reviewed, which shows parafalcine subdural hematoma, repeat CT head shows stable to decreased size without any other hemorrhage present.    -no acute neurosurgical intervention indicated at this time -discussed with the patient, difficult situation with a relatively fresh stent and intracranial hemorrhage. From a neurosurgical perspective, parafalcine subdurals are almost never symptomatic and very rarely expand to the point where they require surgical intervention. But generally we hold anti-platelet agents so this is a bit of a selection bias. We don't obviously have high quality data for this scenario, that I am aware of, odds of stent occlusion are likely higher, so I think benefits outweigh risks of continuing the DAPT. Discussed the risks of this at length with the patient. I think the more pressing  concern is his frequent falls with respect to risks of anticoagulation. -follow up with me in 2 weeks, okay for discharge home from my perspective -please call with any concerns or questions  Judith Part, MD 01/14/22 12:26 PM [facility]

## 2022-01-14 NOTE — Progress Notes (Addendum)
[facility] PROGRESS NOTE   [name]:[id number] DOB: 1955/12/22 DOA: 01/13/2022  PCP: Pcp, No  Brief History/Interval Summary:  66 y.o. male with medical history significant for Type 2 DM with diabetic nephropathy, essential hypertension, CKD Stage 3, gout, hx of prostate cancer, GERD who presented to ED after a fall. He has history of frequent falls.  He was not any loss of consciousness associated with this fall.  He was found to have a subdural hematoma.  He was hospitalized for further management.    Consultants: Neurosurgery.  Will discuss with cardiology.  Procedures: None yet    Subjective/Interval History: Patient mentions that his headache has improved.  He denies any dizziness or lightheadedness.  No chest pain or shortness of breath.  However he does mention that he is quite fatigued.  Has had some unsteadiness over the past few months.  Denies any weakness in any 1 side of the body.     Assessment/Plan:  Acute subdural hematoma Secondary to a fall.  Patient was on aspirin and Plavix for coronary artery disease.  Patient was seen by neurosurgery.  No plans for surgical intervention.  CT scan to be repeated today.  Will involve PT and OT since patient has had several falls and has been feeling unsteady.  He does not have any focal neurological deficits.   Neurosurgery recommends holding Plavix for 1 week.  This will need to be discussed with cardiology due to presence of drug-eluting stent in his mid LAD, placed in August.  Coronary artery disease He is status post PTCI.  Drug-eluting stent was placed to mid LAD on August 14.  Patient is supposed to be on Plavix and aspirin for 1 year.  These medicines are currently on hold due to subdural hematoma.  We will need to discuss with neurosurgery as to when aspirin can be resumed.  They recommend holding Plavix for 1 week.  Left elbow pain X-rays did not show any fractures.  Analgesic agents as  needed.  Essential hypertension Monitor blood pressures.  Noted to be on amlodipine and metoprolol.  Diabetes mellitus type 2 with diabetic neuropathy HbA1c 9.1.  Currently on glargine insulin and SSI.  Also noted to be on Farxiga.  Monitor CBGs.  CKD stage IIIb Renal function noted to be at baseline.  Baseline creatinine is between 1.5-1.7.  History of adjustment disorder with mixed anxiety and depressed mood Continue Zoloft and as needed Ativan.  Hyperlipidemia Continue Lipitor.  History of prostate cancer with recurrent urinary obstruction Followed by urology in [place] as well as an [facility]. PSA noted to be 7.26.  Recommend follow-up with his urologist.  DVT Prophylaxis: SCDs Code Status: Full code Family Communication: Discussed with patient Disposition Plan: He lives alone.  PT evaluation.  Status is: Observation The patient will require care spanning > 2 midnights and should be moved to inpatient because: Unsafe discharge as he lives by himself and has had recurrent falls.  Now with  subdural hematoma      Medications: Scheduled:  amLODipine  5 mg Oral Daily   atorvastatin  80 mg Oral Daily   dapagliflozin propanediol  10 mg Oral Daily   finasteride  5 mg Oral Daily   insulin aspart  0-15 Units Subcutaneous TID WC   insulin glargine-yfgn  20 Units Subcutaneous Daily   LORazepam  1 mg Oral BID   metoprolol succinate  25 mg Oral Daily   sertraline  75 mg Oral Daily   Continuous: NFA:OZHYQMVHQIONG **  OR** acetaminophen, HYDROcodone-acetaminophen, LORazepam, ondansetron **OR** ondansetron (ZOFRAN) IV  Antibiotics: Anti-infectives (From admission, onward)    None       Objective:  Vital Signs  Vitals:   01/14/22 0444 01/14/22 0500 01/14/22 0600 01/14/22 0700  BP:  (!) 159/82 (!) 165/93 (!) 142/77  Pulse:  86 97 99  Resp:  20 (!) 23 (!) 23  Temp: 99.1 F (37.3 C)     TempSrc: Oral     SpO2:  94% 96% 93%  Weight:      Height:         Intake/Output Summary (Last 24 hours) at 01/14/2022 0926 Last data filed at 01/13/2022 1927 Gross per 24 hour  Intake 1003.05 ml  Output 0 ml  Net 1003.05 ml   Filed Weights   01/13/22 1345  Weight: 75.8 kg    General appearance: Awake alert.  In no distress Resp: Clear to auscultation bilaterally.  Normal effort Cardio: S1-S2 is normal regular.  No S3-S4.  No rubs murmurs or bruit GI: Abdomen is soft.  Nontender nondistended.  Bowel sounds are present normal.  No masses organomegaly Extremities: No edema.  Full range of motion of lower extremities. Neurologic: Alert and oriented x3.  No focal neurological deficits.    Lab Results:  Data Reviewed: I have personally reviewed following labs and reports of the imaging studies  CBC: Recent Labs  Lab 01/13/22 1359 01/13/22 1428 01/13/22 1432 01/14/22 0430  WBC  --   --  10.6* 7.2  NEUTROABS  --   --  9.5*  --   HGB 17.7* 18.7* 16.9 17.1*  HCT 52.0 55.0* 48.9 52.5*  MCV  --   --  85.8 87.8  PLT  --   --  210 254    Basic Metabolic Panel: Recent Labs  Lab 01/13/22 1359 01/13/22 1428 01/13/22 1600 01/14/22 0430  NA 134* 134* 136 137  K 6.3* 6.2* 4.4 4.1  CL 104 104 104 105  CO2  --   --  21* 20*  GLUCOSE 346* 346* 312* 222*  BUN 41* 42* 25* 22  CREATININE 1.60* 1.60* 1.55* 1.62*  CALCIUM  --   --  9.2 9.2    GFR: Estimated Creatinine Clearance: 39.9 mL/min (A) (by C-G formula based on SCr of 1.62 mg/dL (H)).  Liver Function Tests: Recent Labs  Lab 01/13/22 1600  AST 22  ALT 36  ALKPHOS 138*  BILITOT 0.8  PROT 7.0  ALBUMIN 3.5    HbA1C: Recent Labs    01/13/22 2125  HGBA1C 9.1*    CBG: Recent Labs  Lab 01/13/22 2240 01/14/22 0743  GLUCAP 166* 210*     Radiology Studies: DG Forearm Left  Result Date: 01/13/2022 CLINICAL DATA:  Fall EXAM: LEFT FOREARM - 2 VIEW COMPARISON:  None Available. FINDINGS: Soft tissue swelling posteriorly overlying the elbow and proximal to mid forearm. No  underlying acute bony abnormality. No acute fracture, subluxation or dislocation. No joint effusion seen within the left elbow. IMPRESSION: Posterior soft tissue swelling.  No acute bony abnormality. Electronically Signed   By: Rolm Baptise M.D.   On: 01/13/2022 19:18   CT Head Wo Contrast  Result Date: 01/13/2022 CLINICAL DATA:  Head trauma, patient is on blood thinners. EXAM: CT HEAD WITHOUT CONTRAST TECHNIQUE: Contiguous axial images were obtained from the base of the skull through the vertex without intravenous contrast. RADIATION DOSE REDUCTION: This exam was performed according to the departmental dose-optimization program which includes automated exposure control, adjustment  of the mA and/or kV according to patient size and/or use of iterative reconstruction technique. COMPARISON:  CT head dated 09/24/2021. FINDINGS: Brain: Acute blood products are seen along the falx, measuring up to 5 mm in greatest thickness, representing an acute subdural hematoma. There is mild associated mass effect. No midline shift. There is slight thickening along the tentorium which likely reflects layering of blood products. No definite subarachnoid or intraventricular extension of blood products is identified. The basilar cisterns are patent. There is mild cerebral volume loss with associated ex vacuo dilatation. Periventricular white matter hypoattenuation likely represents chronic small vessel ischemic disease. Vascular: There are vascular calcifications in the carotid siphons. Skull: Normal. Negative for fracture or focal lesion. Sinuses/Orbits: No acute finding. Other: None. IMPRESSION: Acute subdural hematoma along the falx measuring up to 5 mm in greatest thickness with mild associated mass effect. No midline shift. These results were called by telephone at the time of interpretation on 01/13/2022 at 2:34 pm to provider [facility] , who verbally acknowledged these results. Electronically Signed   By: Zerita Boers  M.D.   On: 01/13/2022 14:35   DG Elbow Complete Left  Result Date: 01/13/2022 CLINICAL DATA:  Left elbow contusion EXAM: LEFT ELBOW - COMPLETE 3+ VIEW COMPARISON:  None Available. FINDINGS: There is no evidence of fracture, dislocation, or joint effusion. There is no evidence of arthropathy or other focal bone abnormality. Soft tissues are unremarkable. IMPRESSION: Negative. Electronically Signed   By: Zerita Boers M.D.   On: 01/13/2022 14:16       LOS: 0 days   [facility] Pager on [email or web address]  01/14/2022, 9:26 AM

## 2022-01-14 NOTE — Care Management Obs Status (Signed)
MEDICARE OBSERVATION STATUS NOTIFICATION   Patient Details  Name: [name] MRN: [id number] Date of Birth: 1955-09-29   Medicare Observation Status Notification Given:  Yes    Fuller Mandril, RN 01/14/2022, 3:43 PM

## 2022-01-15 DIAGNOSIS — S065X0A Traumatic subdural hemorrhage without loss of consciousness, initial encounter: Secondary | ICD-10-CM | POA: Diagnosis not present

## 2022-01-15 DIAGNOSIS — R404 Transient alteration of awareness: Secondary | ICD-10-CM | POA: Diagnosis not present

## 2022-01-15 DIAGNOSIS — Z7401 Bed confinement status: Secondary | ICD-10-CM | POA: Diagnosis not present

## 2022-01-15 DIAGNOSIS — S065XAA Traumatic subdural hemorrhage with loss of consciousness status unknown, initial encounter: Secondary | ICD-10-CM | POA: Diagnosis not present

## 2022-01-15 LAB — BASIC METABOLIC PANEL
Anion gap: 13 (ref 5–15)
BUN: 21 mg/dL (ref 8–23)
CO2: 19 mmol/L — ABNORMAL LOW (ref 22–32)
Calcium: 9.1 mg/dL (ref 8.9–10.3)
Chloride: 106 mmol/L (ref 98–111)
Creatinine, Ser: 1.61 mg/dL — ABNORMAL HIGH (ref 0.61–1.24)
GFR, Estimated: 47 mL/min — ABNORMAL LOW (ref >60)
Glucose, Bld: 99 mg/dL (ref 70–99)
Potassium: 3.7 mmol/L (ref 3.5–5.1)
Sodium: 138 mmol/L (ref 135–145)

## 2022-01-15 LAB — CBC
HCT: 53.6 % — ABNORMAL HIGH (ref 39.0–52.0)
Hemoglobin: 17.1 g/dL — ABNORMAL HIGH (ref 13.0–17.0)
MCH: 28.5 pg (ref 26.0–34.0)
MCHC: 31.9 g/dL (ref 30.0–36.0)
MCV: 89.2 fL (ref 80.0–100.0)
Platelets: 171 10*3/uL (ref 150–400)
RBC: 6.01 MIL/uL — ABNORMAL HIGH (ref 4.22–5.81)
RDW: 13.6 % (ref 11.5–15.5)
WBC: 7.2 10*3/uL (ref 4.0–10.5)
nRBC: 0 % (ref 0.0–0.2)

## 2022-01-15 LAB — CBG MONITORING, ED: Glucose-Capillary: 110 mg/dL — ABNORMAL HIGH (ref 70–99)

## 2022-01-15 MED ORDER — LORAZEPAM 1 MG PO TABS: 1.0000 mg | ORAL_TABLET | Freq: Two times a day (BID) | ORAL | 0 refills | Status: DC

## 2022-01-15 MED ORDER — LORAZEPAM 0.5 MG PO TABS: 0.5000 mg | ORAL_TABLET | Freq: Three times a day (TID) | ORAL | 0 refills | Status: DC | PRN

## 2022-01-15 MED ORDER — HYDROCODONE-ACETAMINOPHEN 5-325 MG PO TABS: 1.0000 | ORAL_TABLET | Freq: Four times a day (QID) | ORAL | 0 refills | Status: DC | PRN

## 2022-01-15 NOTE — ED Notes (Signed)
[facility] called.  Patient should be ready for an anticipated ETA of "within the hour".

## 2022-01-15 NOTE — ED Notes (Signed)
Pt at bedside working with patient

## 2022-01-15 NOTE — ED Notes (Signed)
Pt discharged via [facility] to facility. Report called to [facility] at facility.

## 2022-01-15 NOTE — Discharge Summary (Signed)
[facility]  Physician Discharge Summary   Patient ID: [name] MRN: [id number] DOB/AGE: 68-Jan-1957 68 y.o.  Admit date: 01/13/2022 Discharge date:   01/15/2022   PCP: Pcp, No  DISCHARGE DIAGNOSES:    Acute subdural hematoma (HCC)   CAD S/P percutaneous coronary angioplasty   Left elbow pain   Essential hypertension   Type 2 diabetes mellitus with diabetic nephropathy, without long-term current use of insulin (HCC)   Chronic kidney disease (CKD), stage III (moderate) (HCC)   Adjustment disorder with mixed anxiety and depressed mood   HLD (hyperlipidemia)   History of prostate cancer with recurrent urinary obstruction    RECOMMENDATIONS FOR OUTPATIENT FOLLOW UP: Patient is to follow-up with Dr. Zada Finders with the neurosurgery in 2 weeks. Patient is to follow-up with his urologist for elevated PSA   Home Health: Back to SNF Equipment/Devices: None  CODE STATUS: Full code  DISCHARGE CONDITION: fair  Diet recommendation: Modified carbohydrate  INITIAL HISTORY:  66 y.o. male with medical history significant for Type 2 DM with diabetic nephropathy, essential hypertension, CKD Stage 3, gout, hx of prostate cancer, GERD who presented to ED after a fall. He has history of frequent falls.  He was not any loss of consciousness associated with this fall.  He was found to have a subdural hematoma.  He was hospitalized for further management.     Consultants: Neurosurgery: Dr. Zada Finders   Procedures: None    HOSPITAL COURSE:   Acute subdural hematoma Secondary to a fall.  Patient was on aspirin and Plavix for coronary artery disease.  Patient was seen by neurosurgery.  No plans for surgical intervention.  The scan was repeated and showed stable findings.  Since the stent in his LAD is recent neurosurgery recommends resuming antiplatelet agents at this time.  They will follow the patient in 2 weeks in their office.  Neurological status is stable.     Coronary artery  disease He is status post PTCI.  Drug-eluting stent was placed to mid LAD on August 14.  Patient is supposed to be on Plavix and aspirin for 1 year.  Medications were resumed after discussing with neurosurgery.   Left elbow pain X-rays did not show any fractures.  Analgesic agents as needed.   Essential hypertension Noted to be on amlodipine and metoprolol.   Diabetes mellitus type 2 with diabetic neuropathy HbA1c 9.1.  Continue home medication regimen   CKD stage IIIb Renal function noted to be at baseline.  Baseline creatinine is between 1.5-1.7.   History of adjustment disorder with mixed anxiety and depressed mood Continue Zoloft and as needed Ativan.   Hyperlipidemia Continue Lipitor.   History of prostate cancer with recurrent urinary obstruction Followed by urology in [place] as well as an [facility]. PSA noted to be 7.26.  Recommend follow-up with his urologist.   Patient is stable.  Okay for discharge back to his nursing facility.  PERTINENT LABS:  The results of significant diagnostics from this hospitalization (including imaging, microbiology, ancillary and laboratory) are listed below for reference.     Labs:   Basic Metabolic Panel: Recent Labs  Lab 01/13/22 1359 01/13/22 1428 01/13/22 1600 01/14/22 0430 01/15/22 0359  NA 134* 134* 136 137 138  K 6.3* 6.2* 4.4 4.1 3.7  CL 104 104 104 105 106  CO2  --   --  21* 20* 19*  GLUCOSE 346* 346* 312* 222* 99  BUN 41* 42* 25* 22 21  CREATININE 1.60* 1.60* 1.55* 1.62* 1.61*  CALCIUM  --   --  9.2 9.2 9.1   Liver Function Tests: Recent Labs  Lab 01/13/22 1600  AST 22  ALT 36  ALKPHOS 138*  BILITOT 0.8  PROT 7.0  ALBUMIN 3.5    CBC: Recent Labs  Lab 01/13/22 1359 01/13/22 1428 01/13/22 1432 01/14/22 0430 01/15/22 0359  WBC  --   --  10.6* 7.2 7.2  NEUTROABS  --   --  9.5*  --   --   HGB 17.7* 18.7* 16.9 17.1* 17.1*  HCT 52.0 55.0* 48.9 52.5* 53.6*  MCV  --   --  85.8 87.8 89.2  PLT  --   --   210 165 171      CBG: Recent Labs  Lab 01/14/22 1137 01/14/22 1147 01/14/22 1657 01/14/22 2324 01/15/22 0804  GLUCAP 204* 184* 114* 117* 110*     IMAGING STUDIES CT HEAD WO CONTRAST (5MM)  Result Date: 01/14/2022 CLINICAL DATA:  Subdural hematoma. EXAM: CT HEAD WITHOUT CONTRAST TECHNIQUE: Contiguous axial images were obtained from the base of the skull through the vertex without intravenous contrast. RADIATION DOSE REDUCTION: This exam was performed according to the departmental dose-optimization program which includes automated exposure control, adjustment of the mA and/or kV according to patient size and/or use of iterative reconstruction technique. COMPARISON:  CT head 1 day prior FINDINGS: Brain: Again seen is acute subdural blood layering along the falx measuring up to 4-5 mm in maximal thickness in the coronal plane, overall unchanged. There is no mass effect on the underlying brain parenchyma or midline shift. There is no other acute intracranial hemorrhage or extra-axial fluid collection. There is no intraventricular hemorrhage. There is no evidence of acute infarct. Parenchymal volume is stable. The ventricles are stable in size, with unchanged prominence of the ventricular system. Patchy and confluent hypodensity in the supratentorial white matter is unchanged, likely reflecting sequela of chronic small-vessel ischemic change There is no solid mass lesion. Vascular: There is calcification of the bilateral carotid siphons and vertebral arteries. Skull: Normal. Negative for fracture or focal lesion. Sinuses/Orbits: There is mucosal thickening throughout the paranasal sinuses the globes and orbits are unremarkable. Other: None IMPRESSION: Unchanged parafalcine subdural hematoma measuring up to 4-5 mm in thickness without mass effect or midline shift. No new acute intracranial pathology. Electronically Signed   By: Valetta Mole M.D.   On: 01/14/2022 12:47   DG Forearm Left  Result  Date: 01/13/2022 CLINICAL DATA:  Fall EXAM: LEFT FOREARM - 2 VIEW COMPARISON:  None Available. FINDINGS: Soft tissue swelling posteriorly overlying the elbow and proximal to mid forearm. No underlying acute bony abnormality. No acute fracture, subluxation or dislocation. No joint effusion seen within the left elbow. IMPRESSION: Posterior soft tissue swelling.  No acute bony abnormality. Electronically Signed   By: Rolm Baptise M.D.   On: 01/13/2022 19:18   CT Head Wo Contrast  Result Date: 01/13/2022 CLINICAL DATA:  Head trauma, patient is on blood thinners. EXAM: CT HEAD WITHOUT CONTRAST TECHNIQUE: Contiguous axial images were obtained from the base of the skull through the vertex without intravenous contrast. RADIATION DOSE REDUCTION: This exam was performed according to the departmental dose-optimization program which includes automated exposure control, adjustment of the mA and/or kV according to patient size and/or use of iterative reconstruction technique. COMPARISON:  CT head dated 09/24/2021. FINDINGS: Brain: Acute blood products are seen along the falx, measuring up to 5 mm in greatest thickness, representing an acute subdural hematoma. There is mild associated mass  effect. No midline shift. There is slight thickening along the tentorium which likely reflects layering of blood products. No definite subarachnoid or intraventricular extension of blood products is identified. The basilar cisterns are patent. There is mild cerebral volume loss with associated ex vacuo dilatation. Periventricular white matter hypoattenuation likely represents chronic small vessel ischemic disease. Vascular: There are vascular calcifications in the carotid siphons. Skull: Normal. Negative for fracture or focal lesion. Sinuses/Orbits: No acute finding. Other: None. IMPRESSION: Acute subdural hematoma along the falx measuring up to 5 mm in greatest thickness with mild associated mass effect. No midline shift. These results  were called by telephone at the time of interpretation on 01/13/2022 at 2:34 pm to provider [facility] , who verbally acknowledged these results. Electronically Signed   By: Zerita Boers M.D.   On: 01/13/2022 14:35   DG Elbow Complete Left  Result Date: 01/13/2022 CLINICAL DATA:  Left elbow contusion EXAM: LEFT ELBOW - COMPLETE 3+ VIEW COMPARISON:  None Available. FINDINGS: There is no evidence of fracture, dislocation, or joint effusion. There is no evidence of arthropathy or other focal bone abnormality. Soft tissues are unremarkable. IMPRESSION: Negative. Electronically Signed   By: Zerita Boers M.D.   On: 01/13/2022 14:16   CT Renal Stone Study  Result Date: 12/26/2021 CLINICAL DATA:  Right flank pain. EXAM: CT ABDOMEN AND PELVIS WITHOUT CONTRAST TECHNIQUE: Multidetector CT imaging of the abdomen and pelvis was performed following the standard protocol without IV contrast. RADIATION DOSE REDUCTION: This exam was performed according to the departmental dose-optimization program which includes automated exposure control, adjustment of the mA and/or kV according to patient size and/or use of iterative reconstruction technique. COMPARISON:  05/22/2021 FINDINGS: Lower chest: Unremarkable Hepatobiliary: No suspicious focal abnormality in the liver on this study without intravenous contrast. Tiny calcified gallstones again noted. No intrahepatic or extrahepatic biliary dilation. Pancreas: No focal mass lesion. No dilatation of the main duct. No intraparenchymal cyst. No peripancreatic edema. Spleen: No splenomegaly. No focal mass lesion. Adrenals/Urinary Tract: No adrenal nodule or mass. The marked right-sided hydroureteronephrosis is stable in the interval. Similar appearance 4.3 cm cyst lower pole right kidney. 10 mm subcapsular lesion in the lateral interpolar right kidney has attenuation too high to be a simple cyst and is similar in size compared to the prior study. Likely a cyst complicated by  proteinaceous debris or hemorrhage, attention on follow-up recommended. Small cyst again noted upper pole left kidney. Stable 4.8 cm cyst lower pole left kidney. No left hydroureter. The urinary bladder appears normal for the degree of distention. Stomach/Bowel: Stomach is unremarkable. No gastric wall thickening. No evidence of outlet obstruction. Duodenum is normally positioned as is the ligament of Treitz. Insert normal small The terminal ileum is normal. The appendix is normal. No gross colonic mass. No colonic wall thickening. Vascular/Lymphatic: There is mild atherosclerotic calcification of the abdominal aorta without aneurysm. There is no gastrohepatic or hepatoduodenal ligament lymphadenopathy. No retroperitoneal or mesenteric lymphadenopathy. No pelvic sidewall lymphadenopathy. Reproductive: Prostate gland is mildly enlarged. Other: No intraperitoneal free fluid. Musculoskeletal: No worrisome lytic or sclerotic osseous abnormality. IMPRESSION: 1. No new or acute findings in the abdomen or pelvis. 2. Stable marked right-sided hydroureteronephrosis with features suggesting previous ureteral reimplantation. 3. Cholelithiasis. 4. Bilateral renal cysts. 5. Prostatomegaly. 6. Aortic Atherosclerosis (ICD10-I70.0). Electronically Signed   By: Misty Stanley M.D.   On: 12/26/2021 05:21    DISCHARGE EXAMINATION: Vitals:   01/15/22 0500 01/15/22 0600 01/15/22 0700 01/15/22 0720  BP: (!) 153/93 Marland Kitchen)  167/98 (!) 154/97   Pulse: 73 75 74   Resp: '15 19 20   '$ Temp:    98.1 F (36.7 C)  TempSrc:    Oral  SpO2: 94% 95% 93%   Weight:      Height:       General appearance: Awake alert.  In no distress Resp: Clear to auscultation bilaterally.  Normal effort Cardio: S1-S2 is normal regular.  No S3-S4.  No rubs murmurs or bruit GI: Abdomen is soft.  Nontender nondistended.  Bowel sounds are present normal.  No masses organomegaly   DISPOSITION: SNF  Discharge Instructions     Call MD for:  difficulty  breathing, headache or visual disturbances   Complete by: As directed    Call MD for:  extreme fatigue   Complete by: As directed    Call MD for:  persistant dizziness or light-headedness   Complete by: As directed    Call MD for:  persistant nausea and vomiting   Complete by: As directed    Call MD for:  severe uncontrolled pain   Complete by: As directed    Call MD for:  temperature >100.4   Complete by: As directed    Diet Carb Modified   Complete by: As directed    Discharge instructions   Complete by: As directed    Please review instructions on the discharge summary.  You were cared for by a hospitalist during your hospital stay. If you have any questions about your discharge medications or the care you received while you were in the hospital after you are discharged, you can call the unit and asked to speak with the hospitalist on call if the hospitalist that took care of you is not available. Once you are discharged, your primary care physician will handle any further medical issues. Please note that NO REFILLS for any discharge medications will be authorized once you are discharged, as it is imperative that you return to your primary care physician (or establish a relationship with a primary care physician if you do not have one) for your aftercare needs so that they can reassess your need for medications and monitor your lab values. If you do not have a primary care physician, you can call [phone] for a physician referral.   Increase activity slowly   Complete by: As directed           Allergies as of 01/15/2022       Reactions   Haldol [haloperidol] Other (See Comments)   Per family cause life threatening complications to patient Per Pt, it causes "complications with my mouth"        Medication List     STOP taking these medications    insulin aspart 100 UNIT/ML injection Commonly known as: novoLOG       TAKE these medications    amLODipine 5 MG  tablet Commonly known as: NORVASC Take 1 tablet (5 mg total) by mouth daily.   aspirin EC 81 MG tablet Take 1 tablet (81 mg total) by mouth daily. Swallow whole.   atorvastatin 80 MG tablet Commonly known as: LIPITOR Take 1 tablet (80 mg total) by mouth daily.   clopidogrel 75 MG tablet Commonly known as: PLAVIX Take 75 mg by mouth daily.   Colchicine 0.6 MG Caps Take 1.2 mg by mouth daily as needed (gout).   Farxiga 10 MG Tabs tablet Generic drug: dapagliflozin propanediol Take 1 tablet (10 mg total) by mouth daily.   finasteride 5  MG tablet Commonly known as: PROSCAR Take 1 tablet (5 mg total) by mouth daily.   HYDROcodone-acetaminophen 5-325 MG tablet Commonly known as: NORCO/VICODIN Take 1-2 tablets by mouth every 6 (six) hours as needed for moderate pain or severe pain.   insulin glargine-yfgn 100 UNIT/ML injection Commonly known as: SEMGLEE Inject 0.2 mLs (20 Units total) into the skin daily.   insulin lispro 100 UNIT/ML KwikPen Commonly known as: HUMALOG Inject 4 Units into the skin 3 (three) times daily. Before meals   LORazepam 0.5 MG tablet Commonly known as: ATIVAN Take 1 tablet (0.5 mg total) by mouth every 8 (eight) hours as needed for anxiety. What changed: when to take this   LORazepam 1 MG tablet Commonly known as: ATIVAN Take 1 tablet (1 mg total) by mouth 2 (two) times daily. What changed: Another medication with the same name was changed. Make sure you understand how and when to take each.   metoprolol succinate 25 MG 24 hr tablet Commonly known as: TOPROL-XL Take 1 tablet (25 mg total) by mouth daily.   sertraline 50 MG tablet Commonly known as: ZOLOFT Take 1 tablet (50 mg total) by mouth daily. What changed: how much to take          Follow-up Information     Judith Part, MD Follow up in 2 week(s).   Specialty: Neurosurgery Why: for subdural hematoma Contact information: [place]  [address] [phone]                 TOTAL DISCHARGE TIME: 35 minutes  [facility] Pager on [email or web address]  01/15/2022, 8:58 AM

## 2022-01-15 NOTE — Evaluation (Signed)
Occupational Therapy Evaluation Patient Details Name: [name] MRN: [id number] DOB: [birth year] Today's Date: 01/15/2022   History of Present Illness Pt is 66 yo male admitted 01/13/22 after fall and admitted with parafalcine subdural hematoma.  Pt with hx of frequent falls, DM2, HTN, CKD, gout, prostate CA, GERD   Clinical Impression   PTA, pt is a resident at a SNF, typically ambulatory without AD and receives light assist for ADLs. Pt presents now with deficits in cognition (attention, motor planning), standing balance and endurance. Overall, pt requires Mod A for bed mobility, Min A for side steps at bedside though noted difficulty lifting L foot. Pt requires ModA  for UB ADL and max A for LB ADLs d/t deficits. Pt reports consistent headache throughout session. Will continue to follow acutely. Recommend return back to SNF once medically stable with therapy follow up as needed.       Recommendations for follow up therapy are one component of a multi-disciplinary discharge planning process, led by the attending physician.  Recommendations may be updated based on patient status, additional functional criteria and insurance authorization.   Follow Up Recommendations  Skilled nursing-short term rehab (<3 hours/day) (back to SNF with therapy follow up as needed)    Assistance Recommended at Discharge Frequent or constant Supervision/Assistance  Patient can return home with the following A lot of help with walking and/or transfers;A lot of help with bathing/dressing/bathroom    Functional Status Assessment  Patient has had a recent decline in their functional status and demonstrates the ability to make significant improvements in function in a reasonable and predictable amount of time.  Equipment Recommendations  None recommended by OT    Recommendations for Other Services       Precautions / Restrictions Precautions Precautions: Fall Restrictions Weight Bearing Restrictions: No       Mobility Bed Mobility Overal bed mobility: Needs Assistance Bed Mobility: Supine to Sit, Sit to Supine     Supine to sit: Mod assist Sit to supine: Mod assist   General bed mobility comments: max multimodal cues, assist to bring LE off of bed and lift trunk, handheld assist to reach bedrail to assist and BLE assist back into bed    Transfers Overall transfer level: Needs assistance Equipment used: 1 person hand held assist Transfers: Sit to/from Stand Sit to Stand: Min assist           General transfer comment: slow to rise, wide stance and kyphotic posture. difficulty lifting L LE to take side steps at bedside      Balance Overall balance assessment: Needs assistance Sitting-balance support: Feet supported, No upper extremity supported Sitting balance-Leahy Scale: Fair     Standing balance support: Single extremity supported, During functional activity Standing balance-Leahy Scale: Poor                             ADL either performed or assessed with clinical judgement   ADL Overall ADL's : Needs assistance/impaired Eating/Feeding: Set up;Bed level Eating/Feeding Details (indicate cue type and reason): assist to open containers Grooming: Minimal assistance;Sitting   Upper Body Bathing: Moderate assistance;Sitting   Lower Body Bathing: Maximal assistance;Sit to/from stand   Upper Body Dressing : Moderate assistance;Sitting   Lower Body Dressing: Maximal assistance;Sit to/from stand   Toilet Transfer: Moderate assistance;Stand-pivot   Toileting- Clothing Manipulation and Hygiene: Maximal assistance;Sit to/from stand         General ADL Comments: Pt limited by  cognition with difficulty attending to tasks and following directions. Delayed motor planning     Vision Ability to See in Adequate Light: 0 Adequate Patient Visual Report: No change from baseline (denies concerns; difficult to assess d/t cognition) Vision Assessment?: No  apparent visual deficits     Perception     Praxis      Pertinent Vitals/Pain Pain Assessment Pain Assessment: No/denies pain     Hand Dominance Right   Extremity/Trunk Assessment Upper Extremity Assessment Upper Extremity Assessment: Difficult to assess due to impaired cognition;Generalized weakness   Lower Extremity Assessment Lower Extremity Assessment: Defer to PT evaluation   Cervical / Trunk Assessment Cervical / Trunk Assessment: Kyphotic   Communication Communication Communication: No difficulties   Cognition Arousal/Alertness: Awake/alert Behavior During Therapy: Restless, Flat affect Overall Cognitive Status: No family/caregiver present to determine baseline cognitive functioning                                 General Comments: Pt oriented to self and place only; followed about 50% of simple commands; perseverating on locating phone, wallet, etc and difficult to redirect     General Comments       Exercises     Shoulder Instructions      Home Living Family/patient expects to be discharged to:: Skilled nursing facility                                 Additional Comments: from [place]      Prior Functioning/Environment Prior Level of Function : Patient poor historian/Family not available;History of Falls (last six months)             Mobility Comments: no use of AD for mobility ADLs Comments: reports some assist for ADLs        OT Problem List: Decreased strength;Decreased activity tolerance;Impaired balance (sitting and/or standing);Decreased cognition;Decreased safety awareness      OT Treatment/Interventions: Self-care/ADL training;Therapeutic exercise;DME and/or AE instruction;Therapeutic activities;Balance training;Patient/family education    OT Goals(Current goals can be found in the care plan section) Acute Rehab OT Goals Patient Stated Goal: find my money OT Goal Formulation: Patient unable to  participate in goal setting Time For Goal Achievement: 01/23/22 Potential to Achieve Goals: Fair  OT Frequency: Min 2X/week    Co-evaluation              AM-PAC OT "6 Clicks" Daily Activity     Outcome Measure Help from another person eating meals?: A Little Help from another person taking care of personal grooming?: A Little Help from another person toileting, which includes using toliet, bedpan, or urinal?: A Lot Help from another person bathing (including washing, rinsing, drying)?: A Lot Help from another person to put on and taking off regular upper body clothing?: A Lot Help from another person to put on and taking off regular lower body clothing?: A Lot 6 Click Score: 14   End of Session Nurse Communication: Mobility status  Activity Tolerance: Patient tolerated treatment well Patient left: in bed;with call bell/phone within reach  OT Visit Diagnosis: Other abnormalities of gait and mobility (R26.89);Unsteadiness on feet (R26.81);Other symptoms and signs involving cognitive function                Time: 4132-4401 OT Time Calculation (min): 17 min Charges:  OT General Charges $OT Visit: 1 Visit OT Evaluation $OT Eval  Low Complexity: 1 Low  Malachy Chamber, OTR/L Acute Rehab Services Office: [phone]   Layla Maw 01/15/2022, 8:38 AM

## 2022-01-16 DIAGNOSIS — I2510 Atherosclerotic heart disease of native coronary artery without angina pectoris: Secondary | ICD-10-CM | POA: Diagnosis not present

## 2022-01-16 DIAGNOSIS — K219 Gastro-esophageal reflux disease without esophagitis: Secondary | ICD-10-CM | POA: Diagnosis not present

## 2022-01-16 DIAGNOSIS — E785 Hyperlipidemia, unspecified: Secondary | ICD-10-CM | POA: Diagnosis not present

## 2022-01-16 DIAGNOSIS — E1121 Type 2 diabetes mellitus with diabetic nephropathy: Secondary | ICD-10-CM | POA: Diagnosis not present

## 2022-01-16 DIAGNOSIS — E119 Type 2 diabetes mellitus without complications: Secondary | ICD-10-CM | POA: Diagnosis not present

## 2022-01-16 DIAGNOSIS — I10 Essential (primary) hypertension: Secondary | ICD-10-CM | POA: Diagnosis not present

## 2022-01-16 DIAGNOSIS — N189 Chronic kidney disease, unspecified: Secondary | ICD-10-CM | POA: Diagnosis not present

## 2022-01-16 DIAGNOSIS — F32A Depression, unspecified: Secondary | ICD-10-CM | POA: Diagnosis not present

## 2022-01-17 DIAGNOSIS — D369 Benign neoplasm, unspecified site: Secondary | ICD-10-CM | POA: Diagnosis not present

## 2022-01-17 DIAGNOSIS — K219 Gastro-esophageal reflux disease without esophagitis: Secondary | ICD-10-CM | POA: Diagnosis not present

## 2022-01-17 DIAGNOSIS — I2510 Atherosclerotic heart disease of native coronary artery without angina pectoris: Secondary | ICD-10-CM | POA: Diagnosis not present

## 2022-01-17 DIAGNOSIS — N189 Chronic kidney disease, unspecified: Secondary | ICD-10-CM | POA: Diagnosis not present

## 2022-01-17 DIAGNOSIS — R410 Disorientation, unspecified: Secondary | ICD-10-CM | POA: Diagnosis not present

## 2022-01-17 DIAGNOSIS — E119 Type 2 diabetes mellitus without complications: Secondary | ICD-10-CM | POA: Diagnosis not present

## 2022-01-17 DIAGNOSIS — E785 Hyperlipidemia, unspecified: Secondary | ICD-10-CM | POA: Diagnosis not present

## 2022-01-23 DIAGNOSIS — M6281 Muscle weakness (generalized): Secondary | ICD-10-CM | POA: Diagnosis not present

## 2022-01-23 DIAGNOSIS — R2689 Other abnormalities of gait and mobility: Secondary | ICD-10-CM | POA: Diagnosis not present

## 2022-01-23 DIAGNOSIS — R55 Syncope and collapse: Secondary | ICD-10-CM | POA: Diagnosis not present

## 2022-01-24 DIAGNOSIS — M6281 Muscle weakness (generalized): Secondary | ICD-10-CM | POA: Diagnosis not present

## 2022-01-24 DIAGNOSIS — R2689 Other abnormalities of gait and mobility: Secondary | ICD-10-CM | POA: Diagnosis not present

## 2022-01-24 DIAGNOSIS — R55 Syncope and collapse: Secondary | ICD-10-CM | POA: Diagnosis not present

## 2022-01-26 DIAGNOSIS — E119 Type 2 diabetes mellitus without complications: Secondary | ICD-10-CM | POA: Diagnosis not present

## 2022-01-26 DIAGNOSIS — N189 Chronic kidney disease, unspecified: Secondary | ICD-10-CM | POA: Diagnosis not present

## 2022-01-26 DIAGNOSIS — I10 Essential (primary) hypertension: Secondary | ICD-10-CM | POA: Diagnosis not present

## 2022-01-26 DIAGNOSIS — I2510 Atherosclerotic heart disease of native coronary artery without angina pectoris: Secondary | ICD-10-CM | POA: Diagnosis not present

## 2022-01-28 DIAGNOSIS — R2689 Other abnormalities of gait and mobility: Secondary | ICD-10-CM | POA: Diagnosis not present

## 2022-01-28 DIAGNOSIS — R55 Syncope and collapse: Secondary | ICD-10-CM | POA: Diagnosis not present

## 2022-01-28 DIAGNOSIS — M6281 Muscle weakness (generalized): Secondary | ICD-10-CM | POA: Diagnosis not present

## 2022-01-29 DIAGNOSIS — M6281 Muscle weakness (generalized): Secondary | ICD-10-CM | POA: Diagnosis not present

## 2022-01-29 DIAGNOSIS — R55 Syncope and collapse: Secondary | ICD-10-CM | POA: Diagnosis not present

## 2022-01-29 DIAGNOSIS — R2689 Other abnormalities of gait and mobility: Secondary | ICD-10-CM | POA: Diagnosis not present

## 2022-01-30 DIAGNOSIS — M6281 Muscle weakness (generalized): Secondary | ICD-10-CM | POA: Diagnosis not present

## 2022-01-30 DIAGNOSIS — R55 Syncope and collapse: Secondary | ICD-10-CM | POA: Diagnosis not present

## 2022-01-30 DIAGNOSIS — R2689 Other abnormalities of gait and mobility: Secondary | ICD-10-CM | POA: Diagnosis not present

## 2022-01-31 DIAGNOSIS — R55 Syncope and collapse: Secondary | ICD-10-CM | POA: Diagnosis not present

## 2022-01-31 DIAGNOSIS — R2689 Other abnormalities of gait and mobility: Secondary | ICD-10-CM | POA: Diagnosis not present

## 2022-01-31 DIAGNOSIS — M6281 Muscle weakness (generalized): Secondary | ICD-10-CM | POA: Diagnosis not present

## 2022-02-01 DIAGNOSIS — R2689 Other abnormalities of gait and mobility: Secondary | ICD-10-CM | POA: Diagnosis not present

## 2022-02-01 DIAGNOSIS — M6281 Muscle weakness (generalized): Secondary | ICD-10-CM | POA: Diagnosis not present

## 2022-02-01 DIAGNOSIS — R55 Syncope and collapse: Secondary | ICD-10-CM | POA: Diagnosis not present

## 2022-02-02 DIAGNOSIS — R55 Syncope and collapse: Secondary | ICD-10-CM | POA: Diagnosis not present

## 2022-02-02 DIAGNOSIS — M6281 Muscle weakness (generalized): Secondary | ICD-10-CM | POA: Diagnosis not present

## 2022-02-02 DIAGNOSIS — R2689 Other abnormalities of gait and mobility: Secondary | ICD-10-CM | POA: Diagnosis not present

## 2022-02-05 DIAGNOSIS — R55 Syncope and collapse: Secondary | ICD-10-CM | POA: Diagnosis not present

## 2022-02-05 DIAGNOSIS — R2689 Other abnormalities of gait and mobility: Secondary | ICD-10-CM | POA: Diagnosis not present

## 2022-02-05 DIAGNOSIS — M6281 Muscle weakness (generalized): Secondary | ICD-10-CM | POA: Diagnosis not present

## 2022-02-06 DIAGNOSIS — R55 Syncope and collapse: Secondary | ICD-10-CM | POA: Diagnosis not present

## 2022-02-06 DIAGNOSIS — M6281 Muscle weakness (generalized): Secondary | ICD-10-CM | POA: Diagnosis not present

## 2022-02-06 DIAGNOSIS — R2689 Other abnormalities of gait and mobility: Secondary | ICD-10-CM | POA: Diagnosis not present

## 2022-02-07 DIAGNOSIS — R55 Syncope and collapse: Secondary | ICD-10-CM | POA: Diagnosis not present

## 2022-02-07 DIAGNOSIS — R2689 Other abnormalities of gait and mobility: Secondary | ICD-10-CM | POA: Diagnosis not present

## 2022-02-07 DIAGNOSIS — M6281 Muscle weakness (generalized): Secondary | ICD-10-CM | POA: Diagnosis not present

## 2022-02-11 DIAGNOSIS — M6281 Muscle weakness (generalized): Secondary | ICD-10-CM | POA: Diagnosis not present

## 2022-02-11 DIAGNOSIS — R2689 Other abnormalities of gait and mobility: Secondary | ICD-10-CM | POA: Diagnosis not present

## 2022-02-11 DIAGNOSIS — E785 Hyperlipidemia, unspecified: Secondary | ICD-10-CM | POA: Diagnosis not present

## 2022-02-11 DIAGNOSIS — I10 Essential (primary) hypertension: Secondary | ICD-10-CM | POA: Diagnosis not present

## 2022-02-11 DIAGNOSIS — R55 Syncope and collapse: Secondary | ICD-10-CM | POA: Diagnosis not present

## 2022-02-13 DIAGNOSIS — M6281 Muscle weakness (generalized): Secondary | ICD-10-CM | POA: Diagnosis not present

## 2022-02-13 DIAGNOSIS — R2689 Other abnormalities of gait and mobility: Secondary | ICD-10-CM | POA: Diagnosis not present

## 2022-02-13 DIAGNOSIS — R55 Syncope and collapse: Secondary | ICD-10-CM | POA: Diagnosis not present

## 2022-02-15 DIAGNOSIS — R296 Repeated falls: Secondary | ICD-10-CM | POA: Diagnosis not present

## 2022-02-15 DIAGNOSIS — F32A Depression, unspecified: Secondary | ICD-10-CM | POA: Diagnosis not present

## 2022-02-15 DIAGNOSIS — E119 Type 2 diabetes mellitus without complications: Secondary | ICD-10-CM | POA: Diagnosis not present

## 2022-02-15 DIAGNOSIS — K219 Gastro-esophageal reflux disease without esophagitis: Secondary | ICD-10-CM | POA: Diagnosis not present

## 2022-02-15 DIAGNOSIS — I2510 Atherosclerotic heart disease of native coronary artery without angina pectoris: Secondary | ICD-10-CM | POA: Diagnosis not present

## 2022-02-15 DIAGNOSIS — I10 Essential (primary) hypertension: Secondary | ICD-10-CM | POA: Diagnosis not present

## 2022-02-15 DIAGNOSIS — R300 Dysuria: Secondary | ICD-10-CM | POA: Diagnosis not present

## 2022-02-15 DIAGNOSIS — N189 Chronic kidney disease, unspecified: Secondary | ICD-10-CM | POA: Diagnosis not present

## 2022-02-15 DIAGNOSIS — R410 Disorientation, unspecified: Secondary | ICD-10-CM | POA: Diagnosis not present

## 2022-02-16 DIAGNOSIS — R55 Syncope and collapse: Secondary | ICD-10-CM | POA: Diagnosis not present

## 2022-02-17 ENCOUNTER — Other Ambulatory Visit: Payer: Self-pay

## 2022-02-17 ENCOUNTER — Emergency Department (HOSPITAL_COMMUNITY): Payer: Medicare Other

## 2022-02-17 ENCOUNTER — Encounter (HOSPITAL_COMMUNITY): Payer: Self-pay | Admitting: Emergency Medicine

## 2022-02-17 ENCOUNTER — Emergency Department (HOSPITAL_COMMUNITY)
Admission: EM | Admit: 2022-02-17 | Discharge: 2022-02-18 | Disposition: A | Discharge status: Skilled Nursing Facility | Payer: Medicare Other | Attending: Emergency Medicine | Admitting: Emergency Medicine

## 2022-02-17 DIAGNOSIS — Z7982 Long term (current) use of aspirin: Secondary | ICD-10-CM | POA: Diagnosis not present

## 2022-02-17 DIAGNOSIS — I739 Peripheral vascular disease, unspecified: Secondary | ICD-10-CM | POA: Diagnosis not present

## 2022-02-17 DIAGNOSIS — Z8546 Personal history of malignant neoplasm of prostate: Secondary | ICD-10-CM | POA: Insufficient documentation

## 2022-02-17 DIAGNOSIS — R0789 Other chest pain: Secondary | ICD-10-CM | POA: Diagnosis not present

## 2022-02-17 DIAGNOSIS — I129 Hypertensive chronic kidney disease with stage 1 through stage 4 chronic kidney disease, or unspecified chronic kidney disease: Secondary | ICD-10-CM | POA: Diagnosis not present

## 2022-02-17 DIAGNOSIS — M25512 Pain in left shoulder: Secondary | ICD-10-CM | POA: Diagnosis not present

## 2022-02-17 DIAGNOSIS — N189 Chronic kidney disease, unspecified: Secondary | ICD-10-CM | POA: Insufficient documentation

## 2022-02-17 DIAGNOSIS — Z743 Need for continuous supervision: Secondary | ICD-10-CM | POA: Diagnosis not present

## 2022-02-17 DIAGNOSIS — E875 Hyperkalemia: Secondary | ICD-10-CM

## 2022-02-17 DIAGNOSIS — R519 Headache, unspecified: Secondary | ICD-10-CM | POA: Insufficient documentation

## 2022-02-17 DIAGNOSIS — Z794 Long term (current) use of insulin: Secondary | ICD-10-CM | POA: Diagnosis not present

## 2022-02-17 DIAGNOSIS — E11649 Type 2 diabetes mellitus with hypoglycemia without coma: Secondary | ICD-10-CM | POA: Diagnosis not present

## 2022-02-17 DIAGNOSIS — Z7902 Long term (current) use of antithrombotics/antiplatelets: Secondary | ICD-10-CM | POA: Insufficient documentation

## 2022-02-17 DIAGNOSIS — R6889 Other general symptoms and signs: Secondary | ICD-10-CM | POA: Diagnosis not present

## 2022-02-17 DIAGNOSIS — R911 Solitary pulmonary nodule: Secondary | ICD-10-CM | POA: Diagnosis not present

## 2022-02-17 DIAGNOSIS — W010XXA Fall on same level from slipping, tripping and stumbling without subsequent striking against object, initial encounter: Secondary | ICD-10-CM | POA: Insufficient documentation

## 2022-02-17 DIAGNOSIS — E1122 Type 2 diabetes mellitus with diabetic chronic kidney disease: Secondary | ICD-10-CM | POA: Diagnosis not present

## 2022-02-17 DIAGNOSIS — S0990XA Unspecified injury of head, initial encounter: Secondary | ICD-10-CM | POA: Diagnosis not present

## 2022-02-17 DIAGNOSIS — M533 Sacrococcygeal disorders, not elsewhere classified: Secondary | ICD-10-CM | POA: Insufficient documentation

## 2022-02-17 DIAGNOSIS — W19XXXA Unspecified fall, initial encounter: Secondary | ICD-10-CM

## 2022-02-17 DIAGNOSIS — S065X0A Traumatic subdural hemorrhage without loss of consciousness, initial encounter: Secondary | ICD-10-CM | POA: Diagnosis not present

## 2022-02-17 DIAGNOSIS — S199XXA Unspecified injury of neck, initial encounter: Secondary | ICD-10-CM | POA: Diagnosis not present

## 2022-02-17 DIAGNOSIS — Z79899 Other long term (current) drug therapy: Secondary | ICD-10-CM | POA: Diagnosis not present

## 2022-02-17 DIAGNOSIS — R0689 Other abnormalities of breathing: Secondary | ICD-10-CM | POA: Diagnosis not present

## 2022-02-17 DIAGNOSIS — Z043 Encounter for examination and observation following other accident: Secondary | ICD-10-CM | POA: Diagnosis not present

## 2022-02-17 DIAGNOSIS — M25519 Pain in unspecified shoulder: Secondary | ICD-10-CM | POA: Diagnosis not present

## 2022-02-17 DIAGNOSIS — I2510 Atherosclerotic heart disease of native coronary artery without angina pectoris: Secondary | ICD-10-CM | POA: Diagnosis not present

## 2022-02-17 DIAGNOSIS — R9431 Abnormal electrocardiogram [ECG] [EKG]: Secondary | ICD-10-CM | POA: Diagnosis not present

## 2022-02-17 LAB — COMPREHENSIVE METABOLIC PANEL
ALT: 33 U/L (ref 0–44)
AST: 50 U/L — ABNORMAL HIGH (ref 15–41)
Albumin: 3.6 g/dL (ref 3.5–5.0)
Alkaline Phosphatase: 143 U/L — ABNORMAL HIGH (ref 38–126)
Anion gap: 15 (ref 5–15)
BUN: 17 mg/dL (ref 8–23)
CO2: 21 mmol/L — ABNORMAL LOW (ref 22–32)
Calcium: 9.5 mg/dL (ref 8.9–10.3)
Chloride: 104 mmol/L (ref 98–111)
Creatinine, Ser: 1.68 mg/dL — ABNORMAL HIGH (ref 0.61–1.24)
GFR, Estimated: 44 mL/min — ABNORMAL LOW (ref >60)
Glucose, Bld: 64 mg/dL — ABNORMAL LOW (ref 70–99)
Potassium: 5.6 mmol/L — ABNORMAL HIGH (ref 3.5–5.1)
Sodium: 140 mmol/L (ref 135–145)
Total Bilirubin: 1.6 mg/dL — ABNORMAL HIGH (ref 0.3–1.2)
Total Protein: 6.9 g/dL (ref 6.5–8.1)

## 2022-02-17 LAB — CBC
HCT: 54.2 % — ABNORMAL HIGH (ref 39.0–52.0)
Hemoglobin: 18.8 g/dL — ABNORMAL HIGH (ref 13.0–17.0)
MCH: 28.7 pg (ref 26.0–34.0)
MCHC: 34.7 g/dL (ref 30.0–36.0)
MCV: 82.7 fL (ref 80.0–100.0)
Platelets: 362 10*3/uL (ref 150–400)
RBC: 6.55 MIL/uL — ABNORMAL HIGH (ref 4.22–5.81)
RDW: 14.9 % (ref 11.5–15.5)
WBC: 11.5 10*3/uL — ABNORMAL HIGH (ref 4.0–10.5)
nRBC: 0 % (ref 0.0–0.2)

## 2022-02-17 LAB — CBG MONITORING, ED: Glucose-Capillary: 85 mg/dL (ref 70–99)

## 2022-02-17 LAB — I-STAT CHEM 8, ED
BUN: 24 mg/dL — ABNORMAL HIGH (ref 8–23)
Calcium, Ion: 1.03 mmol/L — ABNORMAL LOW (ref 1.15–1.40)
Chloride: 104 mmol/L (ref 98–111)
Creatinine, Ser: 1.5 mg/dL — ABNORMAL HIGH (ref 0.61–1.24)
Glucose, Bld: 66 mg/dL — ABNORMAL LOW (ref 70–99)
HCT: 56 % — ABNORMAL HIGH (ref 39.0–52.0)
Hemoglobin: 19 g/dL — ABNORMAL HIGH (ref 13.0–17.0)
Potassium: 5.2 mmol/L — ABNORMAL HIGH (ref 3.5–5.1)
Sodium: 141 mmol/L (ref 135–145)
TCO2: 26 mmol/L (ref 22–32)

## 2022-02-17 LAB — SAMPLE TO BLOOD BANK

## 2022-02-17 LAB — PROTIME-INR
INR: 1.1 (ref 0.8–1.2)
Prothrombin Time: 13.8 seconds (ref 11.4–15.2)

## 2022-02-17 LAB — ETHANOL: Alcohol, Ethyl (B): <10 mg/dL (ref <10)

## 2022-02-17 LAB — LACTIC ACID, PLASMA: Lactic Acid, Venous: 1.3 mmol/L (ref 0.5–1.9)

## 2022-02-17 LAB — TROPONIN I (HIGH SENSITIVITY): Troponin I (High Sensitivity): 9 ng/L (ref <18)

## 2022-02-17 MED ORDER — LOKELMA 5 G PO PACK: 10.0000 g | PACK | Freq: Every day | ORAL | 0 refills | Status: AC

## 2022-02-17 MED ORDER — SODIUM ZIRCONIUM CYCLOSILICATE 10 G PO PACK
10.0000 g | PACK | Freq: Once | ORAL | Status: AC
  Administered 2022-02-17: 10 g via ORAL
  Filled 2022-02-17: qty 1

## 2022-02-17 NOTE — ED Notes (Signed)
Transported to CT by Trauma RN

## 2022-02-17 NOTE — ED Notes (Signed)
The pt is asking for something to drink

## 2022-02-17 NOTE — ED Notes (Signed)
Pt has taken off C-collar and refuses to placed back into collar.  MD Schlossman notified and aware.  No new orders at this time.

## 2022-02-17 NOTE — Progress Notes (Signed)
Orthopedic Tech Progress Note Patient Details:  [name] 04/18/1955 [id number]   Level 2 trauma, ortho not needed  Patient ID: [name], male   DOB: 02-26-1956, 66 y.o.   MRN: [id number]  [name] 02/17/2022, 5:54 PM

## 2022-02-17 NOTE — ED Provider Notes (Signed)
[facility] EMERGENCY DEPARTMENT Provider Note   CSN: [id number] Arrival date & time: 02/17/22  1628     History  Chief Complaint  Patient presents with   Fall    On Thinners     [name] is a 66 y.o. male.  HPI      66 year old male with a history of diabetes, hypertension, prostate cancer, CKD, hlpd, prostate cancer, recurrent urinary obstruction, CAD with stent 10/2021 on plavix and aspirin recent admission with SDH from fall 10/31 who was resume don antiplatelets given recent catheterization, who presents with concern for falls from [facility].  He is an unclear historian, but reports falls over the last 3-4 days. Unclear if they are caused by slipping and falling or feeling lightheaded, weak or syncope.  Does report feeling lightheaded today, reports he "fell part way" but the other day did fall onto his right side and hit ride side of his head.  From the fall, reports right-sided chest wall pain, right-sided headache, and left shoulder pain.  Denies shortness of breath.  Denies nausea, vomiting, numbness, weakness, fever, cough, abdominal pain.  Reports he had a feeling of both of his legs were getting shaky.  Reports that he has episodes at times of palpitations but did not have any today.  He is not clear if he lost consciousness today.  Reports that it happened so fast.  EMS reported that the floors appeared to be slippery and he was wearing cowboy boots and suspected mechanical falls.    [facility] reports he has had a few falls and a history of frequent falls.  He has a chronically unsteady gait and does like to wander.  He is oriented to self and place typically.     Past Medical History:  Diagnosis Date   Acute kidney injury superimposed on chronic kidney disease ([place]) 02/10/2020   Acute on chronic kidney failure (HCC) 09/28/2019   Closed nondisplaced spiral fracture of shaft of right tibia    Community acquired pneumonia 73/42/8768    Complicated urinary tract infection 09/06/2016   Depression    Diabetes mellitus without complication (HCC)    GERD (gastroesophageal reflux disease)    Gout    right foot   Hemorrhoids    Hypertension    Macular degeneration    Microcytic anemia    PNA (pneumonia) 03/26/2021   Prostate cancer ([place]) 08/2009   Tubular adenoma     Past Surgical History:  Procedure Laterality Date   CORONARY STENT INTERVENTION N/A 10/29/2021   Procedure: CORONARY STENT INTERVENTION;  Surgeon: Martinique, Peter M, MD;  Location: [place] CV LAB;  Service: Cardiovascular;  Laterality: N/A;  Mid LAD   CYSTOSCOPY WITH RETROGRADE PYELOGRAM, URETEROSCOPY AND STENT PLACEMENT Right 07/31/2016   Procedure: CYSTOSCOPY WITH RIGHT  RETROGRADE PYELOGRAM, URETEROSCOPY;  Surgeon: [name], MD;  [facility];  Service: Urology;  Laterality: Right;   INGUINAL HERNIA REPAIR     INTRAVASCULAR PRESSURE WIRE/FFR STUDY N/A 11/07/2021   Procedure: INTRAVASCULAR PRESSURE WIRE/FFR STUDY;  Surgeon: Early Osmond, MD;  Location: [place] CV LAB;  Service: Cardiovascular;  Laterality: N/A;   Left ankle joint fusion  1981   LEFT HEART CATH AND CORONARY ANGIOGRAPHY N/A 10/29/2021   Procedure: LEFT HEART CATH AND CORONARY ANGIOGRAPHY;  Surgeon: Martinique, Peter M, MD;  Location: [place] CV LAB;  Service: Cardiovascular;  Laterality: N/A;   LEFT HEART CATH AND CORONARY ANGIOGRAPHY N/A 11/07/2021   Procedure: LEFT  HEART CATH AND CORONARY ANGIOGRAPHY;  Surgeon: Early Osmond, MD;  Location: [place] CV LAB;  Service: Cardiovascular;  Laterality: N/A;   PROSTATE BIOPSY     x 2   URETERAL REIMPLANTION  07/31/2016   Procedure: URETERAL REIMPLANT, right boari flap, right psoas hitch;  Surgeon: [name], MD;  [facility];  Service: Urology;;     Home Medications Prior to Admission medications   Medication Sig Start Date End Date Taking? Authorizing Provider  sodium zirconium cyclosilicate (LOKELMA) 5  g packet Take 10 g by mouth daily for 2 days. 02/17/22 02/19/22 Yes Gareth Morgan, MD  amLODipine (NORVASC) 5 MG tablet Take 1 tablet (5 mg total) by mouth daily. 11/01/21   Nooruddin, Marlene Lard, MD  aspirin EC 81 MG tablet Take 1 tablet (81 mg total) by mouth daily. Swallow whole. 11/01/21   Nooruddin, Marlene Lard, MD  atorvastatin (LIPITOR) 80 MG tablet Take 1 tablet (80 mg total) by mouth daily. 11/01/21   Nooruddin, Marlene Lard, MD  clopidogrel (PLAVIX) 75 MG tablet Take 75 mg by mouth daily.    [provider]  Colchicine 0.6 MG CAPS Take 1.2 mg by mouth daily as needed (gout).    [provider]  dapagliflozin propanediol (FARXIGA) 10 MG TABS tablet Take 1 tablet (10 mg total) by mouth daily. 11/09/21   Nooruddin, Marlene Lard, MD  finasteride (PROSCAR) 5 MG tablet Take 1 tablet (5 mg total) by mouth daily. 11/01/21   Nooruddin, Marlene Lard, MD  HYDROcodone-acetaminophen (NORCO/VICODIN) 5-325 MG tablet Take 1-2 tablets by mouth every 6 (six) hours as needed for moderate pain or severe pain. 01/15/22   Bonnielee Haff, MD  insulin glargine-yfgn (SEMGLEE) 100 UNIT/ML injection Inject 0.2 mLs (20 Units total) into the skin daily. 09/12/21   [place] Eric J, DO  insulin lispro (HUMALOG) 100 UNIT/ML KwikPen Inject 4 Units into the skin 3 (three) times daily. Before meals 12/30/21   [provider]  LORazepam (ATIVAN) 0.5 MG tablet Take 1 tablet (0.5 mg total) by mouth every 8 (eight) hours as needed for anxiety. 01/15/22   Bonnielee Haff, MD  LORazepam (ATIVAN) 1 MG tablet Take 1 tablet (1 mg total) by mouth 2 (two) times daily. 01/15/22   Bonnielee Haff, MD  metoprolol succinate (TOPROL-XL) 25 MG 24 hr tablet Take 1 tablet (25 mg total) by mouth daily. 10/31/21   Nooruddin, Marlene Lard, MD  sertraline (ZOLOFT) 50 MG tablet Take 1 tablet (50 mg total) by mouth daily. Patient taking differently: Take 75 mg by mouth daily. 09/12/21   [place] Donnamarie Poag, DO      Allergies    Haldol [haloperidol]    Review of Systems    Review of Systems  Physical Exam Updated Vital Signs BP (!) 133/101   Pulse 63   Temp (!) 97.4 F (36.3 C) (Temporal)   Resp 16   Ht '5\' 6"'$  (1.676 m)   Wt 75.8 kg   SpO2 97%   BMI 26.97 kg/m  Physical Exam Vitals and nursing note reviewed.  Constitutional:      General: He is not in acute distress.    Appearance: He is well-developed. He is not diaphoretic.  HENT:     Head: Normocephalic and atraumatic.  Eyes:     Conjunctiva/sclera: Conjunctivae normal.  Cardiovascular:     Rate and Rhythm: Normal rate and regular rhythm.     Heart sounds: Normal heart sounds. No murmur heard.    No friction rub. No gallop.  Pulmonary:  Effort: Pulmonary effort is normal. No respiratory distress.     Breath sounds: Normal breath sounds. No wheezing or rales.  Chest:     Chest wall: Tenderness (right chest wall, left shoulder) present.  Abdominal:     General: There is no distension.     Palpations: Abdomen is soft.     Tenderness: There is no abdominal tenderness. There is no guarding.  Musculoskeletal:     Cervical back: Normal range of motion.     Comments: No c/t/l spine, sacral tenderness   Skin:    General: Skin is warm and dry.  Neurological:     Mental Status: He is alert and oriented to person, place, and time.     ED Results / Procedures / Treatments   Labs (all labs ordered are listed, but only abnormal results are displayed) Labs Reviewed  COMPREHENSIVE METABOLIC PANEL - Abnormal; Notable for the following components:      Result Value   Potassium 5.6 (*)    CO2 21 (*)    Glucose, Bld 64 (*)    Creatinine, Ser 1.68 (*)    AST 50 (*)    Alkaline Phosphatase 143 (*)    Total Bilirubin 1.6 (*)    GFR, Estimated 44 (*)    All other components within normal limits  CBC - Abnormal; Notable for the following components:   WBC 11.5 (*)    RBC 6.55 (*)    Hemoglobin 18.8 (*)    HCT 54.2 (*)    All other components within normal limits  I-STAT CHEM 8, ED -  Abnormal; Notable for the following components:   Potassium 5.2 (*)    BUN 24 (*)    Creatinine, Ser 1.50 (*)    Glucose, Bld 66 (*)    Calcium, Ion 1.03 (*)    Hemoglobin 19.0 (*)    HCT 56.0 (*)    All other components within normal limits  PROTIME-INR  ETHANOL  LACTIC ACID, PLASMA  URINALYSIS, ROUTINE W REFLEX MICROSCOPIC  CBG MONITORING, ED  CBG MONITORING, ED  SAMPLE TO BLOOD BANK  TROPONIN I (HIGH SENSITIVITY)  TROPONIN I (HIGH SENSITIVITY)    EKG EKG Interpretation  Date/Time:  Sunday February 17 2022 16:36:36 EST Ventricular Rate:  75 PR Interval:  129 QRS Duration: 89 QT Interval:  405 QTC Calculation: 453 R Axis:   -29 Text Interpretation: Sinus rhythm Inferior infarct, old No significant change since last tracing Confirmed by Gareth Morgan [phone]) on 02/17/2022 5:04:08 PM  Radiology DG Shoulder Left  Result Date: 02/17/2022 CLINICAL DATA:  Unwitnessed fall. EXAM: LEFT SHOULDER - 2+ VIEW COMPARISON:  None Available. FINDINGS: There is no evidence of fracture or dislocation. There is no evidence of arthropathy or other focal bone abnormality. Soft tissues are unremarkable. IMPRESSION: Negative. Electronically Signed   By: Virgina Norfolk M.D.   On: 02/17/2022 19:10   CT HEAD WO CONTRAST  Result Date: 02/17/2022 CLINICAL DATA:  Trauma, fall EXAM: CT HEAD WITHOUT CONTRAST TECHNIQUE: Contiguous axial images were obtained from the base of the skull through the vertex without intravenous contrast. RADIATION DOSE REDUCTION: This exam was performed according to the departmental dose-optimization program which includes automated exposure control, adjustment of the mA and/or kV according to patient size and/or use of iterative reconstruction technique. COMPARISON:  01/14/2022 FINDINGS: Brain: No acute intracranial findings are seen. There is interval clearing of small parafalcine subdural hematoma in the interhemispheric region. There are no new foci of intracranial  bleeding. Cortical  sulci are prominent. There is decreased density in periventricular and subcortical white matter. Vascular: Scattered arterial calcifications are seen. Skull: Unremarkable. Sinuses/Orbits: There is mild mucosal thickening in the ethmoid and maxillary sinuses. Other: None. IMPRESSION: There is interval clearing of small interhemispheric subdural hematoma since 01/14/2022. No acute intracranial findings are seen. Atrophy. Small-vessel disease. Electronically Signed   By: Elmer Picker M.D.   On: 02/17/2022 17:53   CT Chest Wo Contrast  Result Date: 02/17/2022 CLINICAL DATA:  Chest pain EXAM: CT CHEST WITHOUT CONTRAST TECHNIQUE: Multidetector CT imaging of the chest was performed following the standard protocol without IV contrast. RADIATION DOSE REDUCTION: This exam was performed according to the departmental dose-optimization program which includes automated exposure control, adjustment of the mA and/or kV according to patient size and/or use of iterative reconstruction technique. COMPARISON:  Chest radiograph done earlier today and CT done on 01/04/2021 FINDINGS: Cardiovascular: Coronary artery calcifications are seen. There is ectasia of ascending thoracic aorta measuring 3.7 cm. Mediastinum/Nodes: No significant lymphadenopathy is seen. Lungs/Pleura: There is no focal pulmonary consolidation. In image 92 of series 8, there is 7 mm nodular density in the medial right lower lung fields and right middle lobe which has not changed significantly. Similar finding has been noted in previous studies dating as far back as 09/06/2016. There is a linear injury extending from this nodule to the pleura which has not changed. There is no pleural effusion or pneumothorax. Upper Abdomen: There are few low-density lesions in the visualized upper portions of the kidneys measuring up to 2.4 cm suggesting renal cysts. Musculoskeletal: No acute findings are seen. IMPRESSION: There is no focal pulmonary  consolidation. There is no pleural effusion or pneumothorax. There is 7 mm nodular density in right middle lobe which has not changed since 09/06/2016, possibly a granuloma or scarring. Coronary artery calcifications are seen. Possible bilateral renal cysts. Electronically Signed   By: Elmer Picker M.D.   On: 02/17/2022 17:49   CT CERVICAL SPINE WO CONTRAST  Result Date: 02/17/2022 CLINICAL DATA:  Polytrauma, blunt EXAM: CT CERVICAL SPINE WITHOUT CONTRAST TECHNIQUE: Multidetector CT imaging of the cervical spine was performed without intravenous contrast. Multiplanar CT image reconstructions were also generated. RADIATION DOSE REDUCTION: This exam was performed according to the departmental dose-optimization program which includes automated exposure control, adjustment of the mA and/or kV according to patient size and/or use of iterative reconstruction technique. COMPARISON:  04/18/2008 FINDINGS: Alignment: Normal. Skull base and vertebrae: No acute fracture. No primary bone lesion or focal pathologic process. Soft tissues and spinal canal: No prevertebral fluid or swelling. No visible canal hematoma. Disc levels:  Maintained Upper chest: Negative. IMPRESSION: Unremarkable study. Electronically Signed   By: Sammie Bench M.D.   On: 02/17/2022 17:42   DG Pelvis Portable  Result Date: 02/17/2022 CLINICAL DATA:  Fall. EXAM: PORTABLE PELVIS 1-2 VIEWS COMPARISON:  None Available. FINDINGS: There is no evidence of pelvic fracture or diastasis. No pelvic bone lesions are seen. IMPRESSION: Negative. Electronically Signed   By: Margarette Canada M.D.   On: 02/17/2022 17:05   DG Chest Port 1 View  Result Date: 02/17/2022 CLINICAL DATA:  Fall. EXAM: PORTABLE CHEST 1 VIEW COMPARISON:  11/28/2021 and prior radiographs FINDINGS: This is a mildly low volume study. The cardiomediastinal silhouette is unremarkable. There is no evidence of focal airspace disease, pulmonary edema, suspicious pulmonary nodule/mass,  pleural effusion, or pneumothorax. No acute bony abnormalities are identified. IMPRESSION: No active disease. Electronically Signed   By: Cleatis Polka.D.  On: 02/17/2022 17:04    Procedures Procedures    Medications Ordered in ED Medications  sodium zirconium cyclosilicate (LOKELMA) packet 10 g (10 g Oral Given 02/17/22 2131)    ED Course/ Medical Decision Making/ A&P                           Medical Decision Making Amount and/or Complexity of Data Reviewed Labs: ordered. Radiology: ordered.  Risk Prescription drug management.    66 year old male with a history of diabetes, hypertension, prostate cancer, CKD, hlpd, prostate cancer, recurrent urinary obstruction, CAD with stent 10/2021 on plavix and aspirin recent admission with SDH from fall 10/31 who was resume don antiplatelets given recent catheterization, who presents with concern for falls from [facility]. Unclear if this unwitnessed fall was syncope or mechanical as Mr. Dorvil is an unclear historian.  Trauma RN at bedside on arrival.   Regarding his fall--- CT cervical spine without acute abnormalities.  CT head was completed and personally evaluated and interpreted by me and radiology and showed interval resolution of his prior subdural hematoma.  He also had chest wall tenderness on exam, and a CT was completed to evaluate for underlying rib fractures in the setting of his age and possible need for admission if there were multiple.  CT shows no evidence of rib fractures.  Shoulder x-ray is negative. XR chest and pelvis evluated at bedside without acute abnormalities.   Regarding question of mechanical fall versus syncope--  EKG was completed and personally eval and interpreted by me and showed no acute change in comparison to prior.  He denies any dyspnea, have low suspicion for pulmonary embolus.  Troponin is negative.  Lactic acid within normal limits.  Noted to have mild hypoglycemia with a glucose of 66, mild  hyperkalemia with a potassium of 5.2-5.6.  His creatinine is at baseline GFR 44.  EKG without acute changes.  Recheck glucose 85.  Encouraged to eat.  Do not feel he requires admission for mildly elevated K, no ECG changes, no ongoing factors to contribute and mild CKD.  Do not feel he had syncope secondary to these. Will give lokelma and rx for lokelma for 2 days, recommend PCP follow up for recheck K.  Discussed with facility and it sounds he is at his baseline.  It does also sound that he is adamant about wearing boots that make him higher fall risk.  Given unclear history and likelihood of baseline gait abnormality and mechanical fall, do not feel he requires admission for question of syncope at thsi time and recommend PCP follow up.          Final Clinical Impression(s) / ED Diagnoses Final diagnoses:  Fall, initial encounter  Hyperkalemia    Rx / DC Orders ED Discharge Orders          Ordered    sodium zirconium cyclosilicate (LOKELMA) 5 g packet  Daily        02/17/22 2203              Gareth Morgan, MD 02/17/22 2344

## 2022-02-17 NOTE — ED Notes (Signed)
Call made to [place]  report given

## 2022-02-17 NOTE — ED Triage Notes (Signed)
Pt BIB [facility] from [facility] due unwitnessed fall from his chair around 1525.  Pt reports left shoulder pain.  Possible LOC.  Pt does take Eliquis.  VS BP 146/96, HR 74, SpO2 96%

## 2022-02-17 NOTE — ED Notes (Signed)
Trauma RN Linda Hedges at bedside

## 2022-02-17 NOTE — Progress Notes (Incomplete)
Trauma Response Nurse Documentation   [name] is a 66 y.o. male arriving to [facility] ED via EMS  On Eliquis (apixaban) daily. Trauma was activated as a Level 2 by *** based on the following trauma criteria {Trauma criteria:26865}. Trauma team at the bedside on patient arrival.   Patient cleared for CT by Dr. Marland Kitchen Pt transported to {TRN Radiology:26861::"CT"} with trauma response nurse present to monitor. RN remained with the patient throughout their absence from the department for clinical observation.   [facility] ***.  History   Past Medical History:  Diagnosis Date   Acute kidney injury superimposed on chronic kidney disease ([employer]) 02/10/2020   Acute on chronic kidney failure (HCC) 09/28/2019   Closed nondisplaced spiral fracture of shaft of right tibia    Community acquired pneumonia 85/27/7824   Complicated urinary tract infection 09/06/2016   Depression    Diabetes mellitus without complication (HCC)    GERD (gastroesophageal reflux disease)    Gout    right foot   Hemorrhoids    Hypertension    Macular degeneration    Microcytic anemia    PNA (pneumonia) 03/26/2021   Prostate cancer ([place]) 08/2009   Tubular adenoma      Past Surgical History:  Procedure Laterality Date   CORONARY STENT INTERVENTION N/A 10/29/2021   Procedure: CORONARY STENT INTERVENTION;  Surgeon: Martinique, Peter M, MD;  Location: [place] CV LAB;  Service: Cardiovascular;  Laterality: N/A;  Mid LAD   CYSTOSCOPY WITH RETROGRADE PYELOGRAM, URETEROSCOPY AND STENT PLACEMENT Right 07/31/2016   Procedure: CYSTOSCOPY WITH RIGHT  RETROGRADE PYELOGRAM, URETEROSCOPY;  Surgeon: [name], MD;  [facility];  Service: Urology;  Laterality: Right;   INGUINAL HERNIA REPAIR     INTRAVASCULAR PRESSURE WIRE/FFR STUDY N/A 11/07/2021   Procedure: INTRAVASCULAR PRESSURE WIRE/FFR STUDY;  Surgeon: Early Osmond, MD;  Location: [place] CV LAB;  Service: Cardiovascular;  Laterality: N/A;   Left ankle joint  fusion  1981   LEFT HEART CATH AND CORONARY ANGIOGRAPHY N/A 10/29/2021   Procedure: LEFT HEART CATH AND CORONARY ANGIOGRAPHY;  Surgeon: Martinique, Peter M, MD;  Location: [place] CV LAB;  Service: Cardiovascular;  Laterality: N/A;   LEFT HEART CATH AND CORONARY ANGIOGRAPHY N/A 11/07/2021   Procedure: LEFT HEART CATH AND CORONARY ANGIOGRAPHY;  Surgeon: Early Osmond, MD;  Location: [place] CV LAB;  Service: Cardiovascular;  Laterality: N/A;   PROSTATE BIOPSY     x 2   URETERAL REIMPLANTION  07/31/2016   Procedure: URETERAL REIMPLANT, right boari flap, right psoas hitch;  Surgeon: [name], MD;  [facility];  Service: Urology;;       Initial Focused Assessment (If applicable, or please see trauma documentation):   CT's Completed:   {Trauma CT:26866}   Interventions:   Plan for disposition:  {Trauma Dispo:26867}   Consults completed:  {Trauma Consults:26862} at ***.  Event Summary:  MTP Summary (If applicable):   Bedside handoff with {Trauma handoff:26863::"ED RN"} ***.    Takiyah Bohnsack L Tawfiq Favila  Trauma Response RN  Please call TRN at [phone] for further assistance.

## 2022-02-18 DIAGNOSIS — E1121 Type 2 diabetes mellitus with diabetic nephropathy: Secondary | ICD-10-CM | POA: Diagnosis not present

## 2022-02-18 DIAGNOSIS — R404 Transient alteration of awareness: Secondary | ICD-10-CM | POA: Diagnosis not present

## 2022-02-18 DIAGNOSIS — Z743 Need for continuous supervision: Secondary | ICD-10-CM | POA: Diagnosis not present

## 2022-02-18 DIAGNOSIS — R55 Syncope and collapse: Secondary | ICD-10-CM | POA: Diagnosis not present

## 2022-02-18 DIAGNOSIS — Z7401 Bed confinement status: Secondary | ICD-10-CM | POA: Diagnosis not present

## 2022-02-18 DIAGNOSIS — I10 Essential (primary) hypertension: Secondary | ICD-10-CM | POA: Diagnosis not present

## 2022-02-18 DIAGNOSIS — R531 Weakness: Secondary | ICD-10-CM | POA: Diagnosis not present

## 2022-02-21 DIAGNOSIS — E785 Hyperlipidemia, unspecified: Secondary | ICD-10-CM | POA: Diagnosis not present

## 2022-02-27 ENCOUNTER — Telehealth: Payer: Self-pay | Admitting: *Deleted

## 2022-02-27 DIAGNOSIS — I10 Essential (primary) hypertension: Secondary | ICD-10-CM | POA: Diagnosis not present

## 2022-02-27 DIAGNOSIS — N189 Chronic kidney disease, unspecified: Secondary | ICD-10-CM | POA: Diagnosis not present

## 2022-02-27 DIAGNOSIS — I2510 Atherosclerotic heart disease of native coronary artery without angina pectoris: Secondary | ICD-10-CM | POA: Diagnosis not present

## 2022-02-27 DIAGNOSIS — E119 Type 2 diabetes mellitus without complications: Secondary | ICD-10-CM | POA: Diagnosis not present

## 2022-02-27 NOTE — Telephone Encounter (Signed)
        Patient  visited [place] ed on 02/18/2022  for fall   Telephone encounter attempt :  1st    Patient was a ed fall visit , phone rang no answer    [place] [phone] [address] , [place] [address] Email : [email or web address]

## 2022-02-28 ENCOUNTER — Emergency Department (HOSPITAL_COMMUNITY): Payer: Medicare Other

## 2022-02-28 ENCOUNTER — Emergency Department (HOSPITAL_COMMUNITY)
Admission: EM | Admit: 2022-02-28 | Discharge: 2022-03-01 | Disposition: A | Discharge status: Other Acute Inpatient Hospital | Payer: Medicare Other | Attending: Emergency Medicine | Admitting: Emergency Medicine

## 2022-02-28 ENCOUNTER — Encounter (HOSPITAL_COMMUNITY): Payer: Self-pay

## 2022-02-28 DIAGNOSIS — Z79899 Other long term (current) drug therapy: Secondary | ICD-10-CM | POA: Insufficient documentation

## 2022-02-28 DIAGNOSIS — R410 Disorientation, unspecified: Secondary | ICD-10-CM | POA: Insufficient documentation

## 2022-02-28 DIAGNOSIS — I6782 Cerebral ischemia: Secondary | ICD-10-CM | POA: Insufficient documentation

## 2022-02-28 DIAGNOSIS — K8020 Calculus of gallbladder without cholecystitis without obstruction: Secondary | ICD-10-CM | POA: Diagnosis not present

## 2022-02-28 DIAGNOSIS — R112 Nausea with vomiting, unspecified: Secondary | ICD-10-CM | POA: Diagnosis not present

## 2022-02-28 DIAGNOSIS — F32A Depression, unspecified: Secondary | ICD-10-CM | POA: Diagnosis not present

## 2022-02-28 DIAGNOSIS — I129 Hypertensive chronic kidney disease with stage 1 through stage 4 chronic kidney disease, or unspecified chronic kidney disease: Secondary | ICD-10-CM | POA: Insufficient documentation

## 2022-02-28 DIAGNOSIS — N1330 Unspecified hydronephrosis: Secondary | ICD-10-CM

## 2022-02-28 DIAGNOSIS — Z794 Long term (current) use of insulin: Secondary | ICD-10-CM | POA: Diagnosis not present

## 2022-02-28 DIAGNOSIS — Z7982 Long term (current) use of aspirin: Secondary | ICD-10-CM | POA: Insufficient documentation

## 2022-02-28 DIAGNOSIS — R109 Unspecified abdominal pain: Secondary | ICD-10-CM | POA: Diagnosis not present

## 2022-02-28 DIAGNOSIS — R110 Nausea: Secondary | ICD-10-CM | POA: Diagnosis not present

## 2022-02-28 DIAGNOSIS — E1122 Type 2 diabetes mellitus with diabetic chronic kidney disease: Secondary | ICD-10-CM | POA: Insufficient documentation

## 2022-02-28 DIAGNOSIS — N189 Chronic kidney disease, unspecified: Secondary | ICD-10-CM | POA: Insufficient documentation

## 2022-02-28 DIAGNOSIS — R1111 Vomiting without nausea: Secondary | ICD-10-CM | POA: Diagnosis not present

## 2022-02-28 DIAGNOSIS — Z743 Need for continuous supervision: Secondary | ICD-10-CM | POA: Diagnosis not present

## 2022-02-28 DIAGNOSIS — Z8546 Personal history of malignant neoplasm of prostate: Secondary | ICD-10-CM | POA: Diagnosis not present

## 2022-02-28 DIAGNOSIS — N281 Cyst of kidney, acquired: Secondary | ICD-10-CM | POA: Diagnosis not present

## 2022-02-28 LAB — CBC
HCT: 56.1 % — ABNORMAL HIGH (ref 39.0–52.0)
Hemoglobin: 18.2 g/dL — ABNORMAL HIGH (ref 13.0–17.0)
MCH: 28.3 pg (ref 26.0–34.0)
MCHC: 32.4 g/dL (ref 30.0–36.0)
MCV: 87.4 fL (ref 80.0–100.0)
Platelets: 196 10*3/uL (ref 150–400)
RBC: 6.42 MIL/uL — ABNORMAL HIGH (ref 4.22–5.81)
RDW: 14 % (ref 11.5–15.5)
WBC: 9.4 10*3/uL (ref 4.0–10.5)
nRBC: 0 % (ref 0.0–0.2)

## 2022-02-28 LAB — COMPREHENSIVE METABOLIC PANEL
ALT: 26 U/L (ref 0–44)
AST: 34 U/L (ref 15–41)
Albumin: 3.9 g/dL (ref 3.5–5.0)
Alkaline Phosphatase: 133 U/L — ABNORMAL HIGH (ref 38–126)
Anion gap: 15 (ref 5–15)
BUN: 17 mg/dL (ref 8–23)
CO2: 23 mmol/L (ref 22–32)
Calcium: 9.4 mg/dL (ref 8.9–10.3)
Chloride: 102 mmol/L (ref 98–111)
Creatinine, Ser: 1.31 mg/dL — ABNORMAL HIGH (ref 0.61–1.24)
GFR, Estimated: 59 mL/min — ABNORMAL LOW (ref >60)
Glucose, Bld: 124 mg/dL — ABNORMAL HIGH (ref 70–99)
Potassium: 4.2 mmol/L (ref 3.5–5.1)
Sodium: 140 mmol/L (ref 135–145)
Total Bilirubin: 1.3 mg/dL — ABNORMAL HIGH (ref 0.3–1.2)
Total Protein: 7.7 g/dL (ref 6.5–8.1)

## 2022-02-28 LAB — LIPASE, BLOOD: Lipase: 25 U/L (ref 11–51)

## 2022-02-28 MED ORDER — ONDANSETRON HCL 4 MG/2ML IJ SOLN
4.0000 mg | Freq: Once | INTRAMUSCULAR | Status: AC
  Administered 2022-02-28: 4 mg via INTRAVENOUS
  Filled 2022-02-28: qty 2

## 2022-02-28 MED ORDER — SODIUM CHLORIDE 0.9 % IV BOLUS (SEPSIS)
1000.0000 mL | Freq: Once | INTRAVENOUS | Status: AC
  Administered 2022-02-28: 1000 mL via INTRAVENOUS

## 2022-02-28 MED ORDER — IOHEXOL 300 MG/ML  SOLN
100.0000 mL | Freq: Once | INTRAMUSCULAR | Status: AC | PRN
  Administered 2022-02-28: 100 mL via INTRAVENOUS

## 2022-02-28 MED ORDER — SODIUM CHLORIDE 0.9 % IV SOLN: 1000.0000 mL | INTRAVENOUS | Status: DC

## 2022-02-28 NOTE — ED Provider Notes (Signed)
[facility] DEPT Provider Note   CSN: [id number] Arrival date & time: 02/28/22  1902     History  Chief Complaint  Patient presents with   Emesis    [name] is a 66 y.o. male.   Emesis  Patient has a history of GERD, prostate cancer, diabetes, hypertension, gout, acute on chronic kidney disease who resides at a nursing facility.  Patient presents to the ED for evaluation of nausea and vomiting.  Patient is resident of [facility] facility.  Patient reportedly was confused and they decreased his Ativan dosing.  Patient himself is alert and awake.  He complains of having some nausea and vomiting today.  He also complains of some mild upper abdominal discomfort.  He is not having any pain now.  He is not having any diarrhea.    Home Medications Prior to Admission medications   Medication Sig Start Date End Date Taking? Authorizing Provider  amLODipine (NORVASC) 5 MG tablet Take 1 tablet (5 mg total) by mouth daily. 11/01/21   Nooruddin, Marlene Lard, MD  aspirin EC 81 MG tablet Take 1 tablet (81 mg total) by mouth daily. Swallow whole. 11/01/21   Nooruddin, Marlene Lard, MD  atorvastatin (LIPITOR) 80 MG tablet Take 1 tablet (80 mg total) by mouth daily. 11/01/21   Nooruddin, Marlene Lard, MD  clopidogrel (PLAVIX) 75 MG tablet Take 75 mg by mouth daily.    [provider]  Colchicine 0.6 MG CAPS Take 1.2 mg by mouth daily as needed (gout).    [provider]  dapagliflozin propanediol (FARXIGA) 10 MG TABS tablet Take 1 tablet (10 mg total) by mouth daily. 11/09/21   Nooruddin, Marlene Lard, MD  finasteride (PROSCAR) 5 MG tablet Take 1 tablet (5 mg total) by mouth daily. 11/01/21   Nooruddin, Marlene Lard, MD  HYDROcodone-acetaminophen (NORCO/VICODIN) 5-325 MG tablet Take 1-2 tablets by mouth every 6 (six) hours as needed for moderate pain or severe pain. 01/15/22   Bonnielee Haff, MD  insulin glargine-yfgn (SEMGLEE) 100 UNIT/ML injection Inject 0.2 mLs (20 Units  total) into the skin daily. 09/12/21   [place] Eric J, DO  insulin lispro (HUMALOG) 100 UNIT/ML KwikPen Inject 4 Units into the skin 3 (three) times daily. Before meals 12/30/21   [provider]  LORazepam (ATIVAN) 0.5 MG tablet Take 1 tablet (0.5 mg total) by mouth every 8 (eight) hours as needed for anxiety. 01/15/22   Bonnielee Haff, MD  LORazepam (ATIVAN) 1 MG tablet Take 1 tablet (1 mg total) by mouth 2 (two) times daily. 01/15/22   Bonnielee Haff, MD  metoprolol succinate (TOPROL-XL) 25 MG 24 hr tablet Take 1 tablet (25 mg total) by mouth daily. 10/31/21   Nooruddin, Marlene Lard, MD  sertraline (ZOLOFT) 50 MG tablet Take 1 tablet (50 mg total) by mouth daily. Patient taking differently: Take 75 mg by mouth daily. 09/12/21   [place] Donnamarie Poag, DO      Allergies    Haldol [haloperidol]    Review of Systems   Review of Systems  Gastrointestinal:  Positive for vomiting.    Physical Exam Updated Vital Signs BP (!) 145/121   Pulse 66   Temp 97.6 F (36.4 C) (Oral)   Resp 16   Ht 1.676 m ('5\' 6"'$ )   Wt 75.8 kg   SpO2 99%   BMI 26.97 kg/m  Physical Exam Vitals and nursing note reviewed.  Constitutional:      General: He is not in acute distress.    Appearance: He  is well-developed.  HENT:     Head: Normocephalic and atraumatic.     Right Ear: External ear normal.     Left Ear: External ear normal.  Eyes:     General: No scleral icterus.       Right eye: No discharge.        Left eye: No discharge.     Conjunctiva/sclera: Conjunctivae normal.  Neck:     Trachea: No tracheal deviation.  Cardiovascular:     Rate and Rhythm: Normal rate and regular rhythm.  Pulmonary:     Effort: Pulmonary effort is normal. No respiratory distress.     Breath sounds: Normal breath sounds. No stridor. No wheezing or rales.  Abdominal:     General: Bowel sounds are normal. There is no distension.     Palpations: Abdomen is soft.     Tenderness: There is no abdominal tenderness. There is no  guarding or rebound.  Musculoskeletal:        General: No tenderness or deformity.     Cervical back: Neck supple.  Skin:    General: Skin is warm and dry.     Findings: No rash.  Neurological:     General: No focal deficit present.     Mental Status: He is alert.     GCS: GCS eye subscore is 4. GCS verbal subscore is 4. GCS motor subscore is 6.     Cranial Nerves: No cranial nerve deficit, dysarthria or facial asymmetry.     Sensory: Sensation is intact. No sensory deficit.     Motor: No weakness, tremor, abnormal muscle tone or seizure activity.     Coordination: Coordination normal.  Psychiatric:        Mood and Affect: Mood normal.     ED Results / Procedures / Treatments   Labs (all labs ordered are listed, but only abnormal results are displayed) Labs Reviewed  CBC - Abnormal; Notable for the following components:      Result Value   RBC 6.42 (*)    Hemoglobin 18.2 (*)    HCT 56.1 (*)    All other components within normal limits  COMPREHENSIVE METABOLIC PANEL - Abnormal; Notable for the following components:   Glucose, Bld 124 (*)    Creatinine, Ser 1.31 (*)    Alkaline Phosphatase 133 (*)    Total Bilirubin 1.3 (*)    GFR, Estimated 59 (*)    All other components within normal limits  LIPASE, BLOOD  URINALYSIS, ROUTINE W REFLEX MICROSCOPIC    EKG None  Radiology CT ABDOMEN PELVIS W CONTRAST  Result Date: 02/28/2022 CLINICAL DATA:  Bowel obstruction suspected. Altered mental status. Multiple falls. EXAM: CT ABDOMEN AND PELVIS WITH CONTRAST TECHNIQUE: Multidetector CT imaging of the abdomen and pelvis was performed using the standard protocol following bolus administration of intravenous contrast. RADIATION DOSE REDUCTION: This exam was performed according to the departmental dose-optimization program which includes automated exposure control, adjustment of the mA and/or kV according to patient size and/or use of iterative reconstruction technique. CONTRAST:   193m OMNIPAQUE IOHEXOL 300 MG/ML  SOLN COMPARISON:  CT renal 12/26/2021, CT abdomen pelvis 05/22/2021, CT angio abdomen pelvis 01/04/2021 CT abdomen pelvis 02/10/2020, CT pelvis 01/24/2003 FINDINGS: Lower chest: No acute abnormality. Hepatobiliary: No focal liver abnormality. Nonspecific epic gallbladder. Calcified gallstone noted within the gallbladder lumen. No gallbladder wall thickening or pericholecystic fluid. No biliary dilatation. Pancreas: No focal lesion. Normal pancreatic contour. No surrounding inflammatory changes. No main pancreatic ductal dilatation. Spleen:  Normal in size without focal abnormality. Adrenals/Urinary Tract: No adrenal nodule bilaterally. Similar-appearing chronic severe right hydroureteronephrosis with right-sided ureteral reimplantation. No left hydronephrosis. No nephrolithiasis bilaterally. Stable fluid density lesions consistent with simple renal cysts. Simple renal cysts, in the absence of clinically indicated signs/symptoms, require no independent follow-up. No ureterolithiasis or hydroureter. The urinary bladder is unremarkable. On delayed imaging, there is no urothelial wall thickening and there are no filling defects in the opacified portions of the left collecting system or ureter Stomach/Bowel: Stomach is within normal limits. No evidence of bowel wall thickening or dilatation. Colonic diverticulosis. Appendix appears normal. Vascular/Lymphatic: No abdominal aorta or iliac aneurysm. Mild atherosclerotic plaque of the aorta and its branches. No abdominal, pelvic, or inguinal lymphadenopathy. Reproductive: The prostate is enlarged measuring up to 5.2 cm. Other: No intraperitoneal free fluid. No intraperitoneal free gas. No organized fluid collection. Musculoskeletal: Tiny fat containing inguinal hernias. No suspicious lytic or blastic osseous lesions. No acute displaced fracture. Multilevel degenerative changes of the spine. IMPRESSION: 1. No acute intra-abdominal or  intrapelvic abnormality. 2. Patient status post right ureteral reimplantation with severe chronic hydronephrosis. Limited evaluation of the distal right ureter that is chronically markedly dilated given nonvisualization on the delayed contrast images. Underlying intraluminal mass lesion is not fully excluded. 3. Cholelithiasis with no CT findings of acute cholecystitis. 4.  Aortic Atherosclerosis (ICD10-I70.0). Electronically Signed   By: Iven Finn M.D.   On: 02/28/2022 23:26   CT Head Wo Contrast  Result Date: 02/28/2022 CLINICAL DATA:  Altered mental status EXAM: CT HEAD WITHOUT CONTRAST TECHNIQUE: Contiguous axial images were obtained from the base of the skull through the vertex without intravenous contrast. RADIATION DOSE REDUCTION: This exam was performed according to the departmental dose-optimization program which includes automated exposure control, adjustment of the mA and/or kV according to patient size and/or use of iterative reconstruction technique. COMPARISON:  02/17/2022 FINDINGS: Brain: No evidence of acute infarction, hemorrhage, hydrocephalus, extra-axial collection or mass lesion/mass effect. Chronic atrophic and ischemic changes are noted similar to that seen on the prior exam. Vascular: No hyperdense vessel or unexpected calcification. Skull: Normal. Negative for fracture or focal lesion. Sinuses/Orbits: No acute finding. Other: None. IMPRESSION: Chronic atrophic and ischemic changes.  No acute abnormality noted. Electronically Signed   By: Inez Catalina M.D.   On: 02/28/2022 20:57    Procedures Procedures    Medications Ordered in ED Medications  sodium chloride 0.9 % bolus 1,000 mL (1,000 mLs Intravenous New Bag/Given 02/28/22 2318)    Followed by  0.9 %  sodium chloride infusion (has no administration in time range)  ondansetron (ZOFRAN) injection 4 mg (4 mg Intravenous Given 02/28/22 2319)  iohexol (OMNIPAQUE) 300 MG/ML solution 100 mL (100 mLs Intravenous Contrast  Given 02/28/22 2255)    ED Course/ Medical Decision Making/ A&P Clinical Course as of 03/01/22 0005  Thu Feb 28, 2022  2132 Head CT without acute abnormalities. [JK]  7858 Metabolic panel shows creatinine slightly elevated 1.3.  Bilirubin slightly elevated at 1.3.  CBC unremarkable.  Lipase normal [JK]  2132 With his complaints of vomiting will CT to evaluate for possible obstruction [JK]    Clinical Course User Index [JK] Dorie Rank, MD                           Medical Decision Making Problems Addressed: Hydronephrosis, unspecified hydronephrosis type: chronic illness or injury Nausea and vomiting, unspecified vomiting type: acute illness or injury  Amount  and/or Complexity of Data Reviewed Labs: ordered. Decision-making details documented in ED Course. Radiology: ordered and independent interpretation performed.  Risk Prescription drug management.  Patient presented to the ED for evaluation of nausea and vomiting.  There was also a concern about change in mental status although here in the ED patient is alert and oriented at baseline.  No acute abnormalities noted on head CT.  Patient is at his baseline.  No signs of acute infection.  No focal neurologic deficits.  Did complain of some mild abdominal discomfort.  CT scan was performed and there is no evidence of any acute abnormality.  Chronic nephrosis noted but this is not a new finding.  he was treated with IV fluids and antiemetics.  He has been able to eat and drink without difficulty.  At this time doubt any acute emergent process. Evaluation and diagnostic testing in the emergency department does not suggest an emergent condition requiring admission or immediate intervention beyond what has been performed at this time.  The patient is safe for discharge and has been instructed to return immediately for worsening symptoms, change in symptoms or any other concerns.        Final Clinical Impression(s) / ED  Diagnoses Final diagnoses:  Hydronephrosis, unspecified hydronephrosis type  Nausea and vomiting, unspecified vomiting type    Rx / DC Orders ED Discharge Orders     None         Dorie Rank, MD 03/01/22 0005

## 2022-02-28 NOTE — ED Triage Notes (Signed)
Pt coming from [facility].  Facility reports that ativan decreased today because of altered mental status . Pt is currently at baseline. Pt complaining of emesis. facility reports multiple falls in the past few days.   Bp 160/84 Hr 70 Spo2 99%  CBG 175

## 2022-02-28 NOTE — Discharge Instructions (Addendum)
The CT scan did not show any acute abnormalities.  Hydronephrosis noted on the CT scan is a chronic finding and is associated with your ureteral surgery.  Continue your current medications.

## 2022-03-01 DIAGNOSIS — M25522 Pain in left elbow: Secondary | ICD-10-CM | POA: Diagnosis not present

## 2022-03-01 DIAGNOSIS — N189 Chronic kidney disease, unspecified: Secondary | ICD-10-CM | POA: Diagnosis not present

## 2022-03-01 DIAGNOSIS — K219 Gastro-esophageal reflux disease without esophagitis: Secondary | ICD-10-CM | POA: Diagnosis not present

## 2022-03-01 DIAGNOSIS — Z7401 Bed confinement status: Secondary | ICD-10-CM | POA: Diagnosis not present

## 2022-03-01 DIAGNOSIS — I2510 Atherosclerotic heart disease of native coronary artery without angina pectoris: Secondary | ICD-10-CM | POA: Diagnosis not present

## 2022-03-01 DIAGNOSIS — N1330 Unspecified hydronephrosis: Secondary | ICD-10-CM | POA: Diagnosis not present

## 2022-03-01 DIAGNOSIS — F32A Depression, unspecified: Secondary | ICD-10-CM | POA: Diagnosis not present

## 2022-03-01 DIAGNOSIS — Z743 Need for continuous supervision: Secondary | ICD-10-CM | POA: Diagnosis not present

## 2022-03-01 DIAGNOSIS — R404 Transient alteration of awareness: Secondary | ICD-10-CM | POA: Diagnosis not present

## 2022-03-01 DIAGNOSIS — E119 Type 2 diabetes mellitus without complications: Secondary | ICD-10-CM | POA: Diagnosis not present

## 2022-03-01 DIAGNOSIS — E785 Hyperlipidemia, unspecified: Secondary | ICD-10-CM | POA: Diagnosis not present

## 2022-03-01 DIAGNOSIS — I10 Essential (primary) hypertension: Secondary | ICD-10-CM | POA: Diagnosis not present

## 2022-03-01 DIAGNOSIS — R410 Disorientation, unspecified: Secondary | ICD-10-CM | POA: Diagnosis not present

## 2022-03-01 DIAGNOSIS — S065XAA Traumatic subdural hemorrhage with loss of consciousness status unknown, initial encounter: Secondary | ICD-10-CM | POA: Diagnosis not present

## 2022-03-01 NOTE — ED Notes (Addendum)
Report attempted x2 unable to get in touch with nurse

## 2022-03-01 NOTE — ED Notes (Signed)
Urinal at bedside for U/A collection per MD order. Advised pt to call for assistance as needed. [employer]

## 2022-03-05 ENCOUNTER — Telehealth: Payer: Self-pay | Admitting: *Deleted

## 2022-03-05 DIAGNOSIS — N390 Urinary tract infection, site not specified: Secondary | ICD-10-CM | POA: Diagnosis not present

## 2022-03-05 NOTE — Telephone Encounter (Signed)
        Patient  visited [facility] ed on 02/18/2022  for treatment    Telephone encounter attempt :  2nd  A HIPAA compliant voice message was left requesting a return call.  Instructed patient to call back at [phone].  [facility] [phone] [address] , [place] [address] Email : [email or web address] [email or web address]

## 2022-03-06 ENCOUNTER — Telehealth: Payer: Self-pay | Admitting: *Deleted

## 2022-03-06 NOTE — Telephone Encounter (Signed)
        Patient  visited [facility] ed on 02/18/2022  for treatment cannot be reached at this time   Telephone encounter attempt :  [place] [phone] [address] , [place] [address] Email : Ashby [email or web address]

## 2022-03-14 DIAGNOSIS — S065X9A Traumatic subdural hemorrhage with loss of consciousness of unspecified duration, initial encounter: Secondary | ICD-10-CM | POA: Diagnosis not present

## 2022-03-22 DIAGNOSIS — M109 Gout, unspecified: Secondary | ICD-10-CM | POA: Diagnosis not present

## 2022-03-22 DIAGNOSIS — M19072 Primary osteoarthritis, left ankle and foot: Secondary | ICD-10-CM | POA: Diagnosis not present

## 2022-03-22 DIAGNOSIS — I10 Essential (primary) hypertension: Secondary | ICD-10-CM | POA: Diagnosis not present

## 2022-03-22 DIAGNOSIS — I2510 Atherosclerotic heart disease of native coronary artery without angina pectoris: Secondary | ICD-10-CM | POA: Diagnosis not present

## 2022-03-22 DIAGNOSIS — N189 Chronic kidney disease, unspecified: Secondary | ICD-10-CM | POA: Diagnosis not present

## 2022-03-22 DIAGNOSIS — E119 Type 2 diabetes mellitus without complications: Secondary | ICD-10-CM | POA: Diagnosis not present

## 2022-03-22 DIAGNOSIS — M7989 Other specified soft tissue disorders: Secondary | ICD-10-CM | POA: Diagnosis not present

## 2022-03-23 DIAGNOSIS — I10 Essential (primary) hypertension: Secondary | ICD-10-CM | POA: Diagnosis not present

## 2022-03-28 ENCOUNTER — Other Ambulatory Visit: Payer: Self-pay | Admitting: Urology

## 2022-03-28 DIAGNOSIS — R972 Elevated prostate specific antigen [PSA]: Secondary | ICD-10-CM

## 2022-03-29 DIAGNOSIS — F32A Depression, unspecified: Secondary | ICD-10-CM | POA: Diagnosis not present

## 2022-04-01 DIAGNOSIS — E785 Hyperlipidemia, unspecified: Secondary | ICD-10-CM | POA: Diagnosis not present

## 2022-04-01 DIAGNOSIS — F32A Depression, unspecified: Secondary | ICD-10-CM | POA: Diagnosis not present

## 2022-04-01 DIAGNOSIS — I10 Essential (primary) hypertension: Secondary | ICD-10-CM | POA: Diagnosis not present

## 2022-04-01 DIAGNOSIS — E119 Type 2 diabetes mellitus without complications: Secondary | ICD-10-CM | POA: Diagnosis not present

## 2022-04-01 DIAGNOSIS — K219 Gastro-esophageal reflux disease without esophagitis: Secondary | ICD-10-CM | POA: Diagnosis not present

## 2022-04-04 DIAGNOSIS — I10 Essential (primary) hypertension: Secondary | ICD-10-CM | POA: Diagnosis not present

## 2022-04-04 DIAGNOSIS — E119 Type 2 diabetes mellitus without complications: Secondary | ICD-10-CM | POA: Diagnosis not present

## 2022-04-04 DIAGNOSIS — K219 Gastro-esophageal reflux disease without esophagitis: Secondary | ICD-10-CM | POA: Diagnosis not present

## 2022-04-04 DIAGNOSIS — E785 Hyperlipidemia, unspecified: Secondary | ICD-10-CM | POA: Diagnosis not present

## 2022-04-04 DIAGNOSIS — N189 Chronic kidney disease, unspecified: Secondary | ICD-10-CM | POA: Diagnosis not present

## 2022-04-04 DIAGNOSIS — I2510 Atherosclerotic heart disease of native coronary artery without angina pectoris: Secondary | ICD-10-CM | POA: Diagnosis not present

## 2022-04-05 DIAGNOSIS — E1121 Type 2 diabetes mellitus with diabetic nephropathy: Secondary | ICD-10-CM | POA: Diagnosis not present

## 2022-04-05 DIAGNOSIS — I10 Essential (primary) hypertension: Secondary | ICD-10-CM | POA: Diagnosis not present

## 2022-04-25 DIAGNOSIS — F32A Depression, unspecified: Secondary | ICD-10-CM | POA: Diagnosis not present

## 2022-04-29 DIAGNOSIS — H3530 Unspecified macular degeneration: Secondary | ICD-10-CM | POA: Diagnosis not present

## 2022-04-29 DIAGNOSIS — I2510 Atherosclerotic heart disease of native coronary artery without angina pectoris: Secondary | ICD-10-CM | POA: Diagnosis not present

## 2022-04-29 DIAGNOSIS — K219 Gastro-esophageal reflux disease without esophagitis: Secondary | ICD-10-CM | POA: Diagnosis not present

## 2022-04-29 DIAGNOSIS — N189 Chronic kidney disease, unspecified: Secondary | ICD-10-CM | POA: Diagnosis not present

## 2022-04-29 DIAGNOSIS — E119 Type 2 diabetes mellitus without complications: Secondary | ICD-10-CM | POA: Diagnosis not present

## 2022-04-29 DIAGNOSIS — E785 Hyperlipidemia, unspecified: Secondary | ICD-10-CM | POA: Diagnosis not present

## 2022-04-29 DIAGNOSIS — F32A Depression, unspecified: Secondary | ICD-10-CM | POA: Diagnosis not present

## 2022-05-03 DIAGNOSIS — N189 Chronic kidney disease, unspecified: Secondary | ICD-10-CM | POA: Diagnosis not present

## 2022-05-03 DIAGNOSIS — I2510 Atherosclerotic heart disease of native coronary artery without angina pectoris: Secondary | ICD-10-CM | POA: Diagnosis not present

## 2022-05-03 DIAGNOSIS — I10 Essential (primary) hypertension: Secondary | ICD-10-CM | POA: Diagnosis not present

## 2022-05-03 DIAGNOSIS — E119 Type 2 diabetes mellitus without complications: Secondary | ICD-10-CM | POA: Diagnosis not present

## 2022-05-06 DIAGNOSIS — M6281 Muscle weakness (generalized): Secondary | ICD-10-CM | POA: Diagnosis not present

## 2022-05-06 DIAGNOSIS — R55 Syncope and collapse: Secondary | ICD-10-CM | POA: Diagnosis not present

## 2022-05-06 DIAGNOSIS — E1121 Type 2 diabetes mellitus with diabetic nephropathy: Secondary | ICD-10-CM | POA: Diagnosis not present

## 2022-05-06 DIAGNOSIS — I10 Essential (primary) hypertension: Secondary | ICD-10-CM | POA: Diagnosis not present

## 2022-05-15 DIAGNOSIS — M109 Gout, unspecified: Secondary | ICD-10-CM | POA: Diagnosis not present

## 2022-05-16 DIAGNOSIS — N189 Chronic kidney disease, unspecified: Secondary | ICD-10-CM | POA: Diagnosis not present

## 2022-05-16 DIAGNOSIS — I2510 Atherosclerotic heart disease of native coronary artery without angina pectoris: Secondary | ICD-10-CM | POA: Diagnosis not present

## 2022-05-16 DIAGNOSIS — E119 Type 2 diabetes mellitus without complications: Secondary | ICD-10-CM | POA: Diagnosis not present

## 2022-05-16 DIAGNOSIS — I10 Essential (primary) hypertension: Secondary | ICD-10-CM | POA: Diagnosis not present

## 2022-05-16 DIAGNOSIS — E785 Hyperlipidemia, unspecified: Secondary | ICD-10-CM | POA: Diagnosis not present

## 2022-05-16 DIAGNOSIS — M109 Gout, unspecified: Secondary | ICD-10-CM | POA: Diagnosis not present

## 2022-05-22 DIAGNOSIS — B353 Tinea pedis: Secondary | ICD-10-CM | POA: Diagnosis not present

## 2022-05-23 DIAGNOSIS — E1121 Type 2 diabetes mellitus with diabetic nephropathy: Secondary | ICD-10-CM | POA: Diagnosis not present

## 2022-05-23 DIAGNOSIS — M6281 Muscle weakness (generalized): Secondary | ICD-10-CM | POA: Diagnosis not present

## 2022-05-27 DIAGNOSIS — K219 Gastro-esophageal reflux disease without esophagitis: Secondary | ICD-10-CM | POA: Diagnosis not present

## 2022-05-27 DIAGNOSIS — I10 Essential (primary) hypertension: Secondary | ICD-10-CM | POA: Diagnosis not present

## 2022-05-27 DIAGNOSIS — N189 Chronic kidney disease, unspecified: Secondary | ICD-10-CM | POA: Diagnosis not present

## 2022-05-27 DIAGNOSIS — I214 Non-ST elevation (NSTEMI) myocardial infarction: Secondary | ICD-10-CM | POA: Diagnosis not present

## 2022-05-27 DIAGNOSIS — E785 Hyperlipidemia, unspecified: Secondary | ICD-10-CM | POA: Diagnosis not present

## 2022-05-28 DIAGNOSIS — F32A Depression, unspecified: Secondary | ICD-10-CM | POA: Diagnosis not present

## 2022-05-29 DIAGNOSIS — I2510 Atherosclerotic heart disease of native coronary artery without angina pectoris: Secondary | ICD-10-CM | POA: Diagnosis not present

## 2022-05-29 DIAGNOSIS — N189 Chronic kidney disease, unspecified: Secondary | ICD-10-CM | POA: Diagnosis not present

## 2022-05-29 DIAGNOSIS — E119 Type 2 diabetes mellitus without complications: Secondary | ICD-10-CM | POA: Diagnosis not present

## 2022-05-29 DIAGNOSIS — I10 Essential (primary) hypertension: Secondary | ICD-10-CM | POA: Diagnosis not present

## 2022-06-04 DIAGNOSIS — I739 Peripheral vascular disease, unspecified: Secondary | ICD-10-CM | POA: Diagnosis not present

## 2022-06-04 DIAGNOSIS — L602 Onychogryphosis: Secondary | ICD-10-CM | POA: Diagnosis not present

## 2022-06-05 DIAGNOSIS — I10 Essential (primary) hypertension: Secondary | ICD-10-CM | POA: Diagnosis not present

## 2022-06-05 DIAGNOSIS — E1121 Type 2 diabetes mellitus with diabetic nephropathy: Secondary | ICD-10-CM | POA: Diagnosis not present

## 2022-06-07 DIAGNOSIS — H04121 Dry eye syndrome of right lacrimal gland: Secondary | ICD-10-CM | POA: Diagnosis not present

## 2022-06-10 DIAGNOSIS — C220 Liver cell carcinoma: Secondary | ICD-10-CM | POA: Diagnosis not present

## 2022-06-10 DIAGNOSIS — E785 Hyperlipidemia, unspecified: Secondary | ICD-10-CM | POA: Diagnosis not present

## 2022-06-10 DIAGNOSIS — I10 Essential (primary) hypertension: Secondary | ICD-10-CM | POA: Diagnosis not present

## 2022-07-01 DIAGNOSIS — F32A Depression, unspecified: Secondary | ICD-10-CM | POA: Diagnosis not present

## 2022-07-03 DIAGNOSIS — I2510 Atherosclerotic heart disease of native coronary artery without angina pectoris: Secondary | ICD-10-CM | POA: Diagnosis not present

## 2022-07-03 DIAGNOSIS — E119 Type 2 diabetes mellitus without complications: Secondary | ICD-10-CM | POA: Diagnosis not present

## 2022-07-03 DIAGNOSIS — N189 Chronic kidney disease, unspecified: Secondary | ICD-10-CM | POA: Diagnosis not present

## 2022-07-03 DIAGNOSIS — R296 Repeated falls: Secondary | ICD-10-CM | POA: Diagnosis not present

## 2022-07-03 DIAGNOSIS — I10 Essential (primary) hypertension: Secondary | ICD-10-CM | POA: Diagnosis not present

## 2022-07-12 DIAGNOSIS — E785 Hyperlipidemia, unspecified: Secondary | ICD-10-CM | POA: Diagnosis not present

## 2022-07-12 DIAGNOSIS — B356 Tinea cruris: Secondary | ICD-10-CM | POA: Diagnosis not present

## 2022-07-12 DIAGNOSIS — E119 Type 2 diabetes mellitus without complications: Secondary | ICD-10-CM | POA: Diagnosis not present

## 2022-07-12 DIAGNOSIS — R300 Dysuria: Secondary | ICD-10-CM | POA: Diagnosis not present

## 2022-07-12 DIAGNOSIS — I10 Essential (primary) hypertension: Secondary | ICD-10-CM | POA: Diagnosis not present

## 2022-07-12 DIAGNOSIS — I2510 Atherosclerotic heart disease of native coronary artery without angina pectoris: Secondary | ICD-10-CM | POA: Diagnosis not present

## 2022-07-13 DIAGNOSIS — N390 Urinary tract infection, site not specified: Secondary | ICD-10-CM | POA: Diagnosis not present

## 2022-07-18 DIAGNOSIS — E119 Type 2 diabetes mellitus without complications: Secondary | ICD-10-CM | POA: Diagnosis not present

## 2022-07-18 DIAGNOSIS — I2510 Atherosclerotic heart disease of native coronary artery without angina pectoris: Secondary | ICD-10-CM | POA: Diagnosis not present

## 2022-07-18 DIAGNOSIS — E785 Hyperlipidemia, unspecified: Secondary | ICD-10-CM | POA: Diagnosis not present

## 2022-07-18 DIAGNOSIS — I10 Essential (primary) hypertension: Secondary | ICD-10-CM | POA: Diagnosis not present

## 2022-07-18 DIAGNOSIS — B356 Tinea cruris: Secondary | ICD-10-CM | POA: Diagnosis not present

## 2022-07-31 DIAGNOSIS — Z736 Limitation of activities due to disability: Secondary | ICD-10-CM | POA: Diagnosis not present

## 2022-07-31 DIAGNOSIS — M25512 Pain in left shoulder: Secondary | ICD-10-CM | POA: Diagnosis not present

## 2022-07-31 DIAGNOSIS — E1121 Type 2 diabetes mellitus with diabetic nephropathy: Secondary | ICD-10-CM | POA: Diagnosis not present

## 2022-08-01 DIAGNOSIS — E1121 Type 2 diabetes mellitus with diabetic nephropathy: Secondary | ICD-10-CM | POA: Diagnosis not present

## 2022-08-01 DIAGNOSIS — Z736 Limitation of activities due to disability: Secondary | ICD-10-CM | POA: Diagnosis not present

## 2022-08-01 DIAGNOSIS — M25512 Pain in left shoulder: Secondary | ICD-10-CM | POA: Diagnosis not present

## 2022-08-02 DIAGNOSIS — E1121 Type 2 diabetes mellitus with diabetic nephropathy: Secondary | ICD-10-CM | POA: Diagnosis not present

## 2022-08-02 DIAGNOSIS — Z736 Limitation of activities due to disability: Secondary | ICD-10-CM | POA: Diagnosis not present

## 2022-08-02 DIAGNOSIS — M25512 Pain in left shoulder: Secondary | ICD-10-CM | POA: Diagnosis not present

## 2022-08-03 DIAGNOSIS — Z736 Limitation of activities due to disability: Secondary | ICD-10-CM | POA: Diagnosis not present

## 2022-08-03 DIAGNOSIS — M25512 Pain in left shoulder: Secondary | ICD-10-CM | POA: Diagnosis not present

## 2022-08-03 DIAGNOSIS — E1121 Type 2 diabetes mellitus with diabetic nephropathy: Secondary | ICD-10-CM | POA: Diagnosis not present

## 2022-08-04 DIAGNOSIS — E1121 Type 2 diabetes mellitus with diabetic nephropathy: Secondary | ICD-10-CM | POA: Diagnosis not present

## 2022-08-04 DIAGNOSIS — Z736 Limitation of activities due to disability: Secondary | ICD-10-CM | POA: Diagnosis not present

## 2022-08-04 DIAGNOSIS — M25512 Pain in left shoulder: Secondary | ICD-10-CM | POA: Diagnosis not present

## 2022-08-07 DIAGNOSIS — M25512 Pain in left shoulder: Secondary | ICD-10-CM | POA: Diagnosis not present

## 2022-08-07 DIAGNOSIS — Z736 Limitation of activities due to disability: Secondary | ICD-10-CM | POA: Diagnosis not present

## 2022-08-07 DIAGNOSIS — I2510 Atherosclerotic heart disease of native coronary artery without angina pectoris: Secondary | ICD-10-CM | POA: Diagnosis not present

## 2022-08-07 DIAGNOSIS — I10 Essential (primary) hypertension: Secondary | ICD-10-CM | POA: Diagnosis not present

## 2022-08-07 DIAGNOSIS — E1121 Type 2 diabetes mellitus with diabetic nephropathy: Secondary | ICD-10-CM | POA: Diagnosis not present

## 2022-08-07 DIAGNOSIS — N189 Chronic kidney disease, unspecified: Secondary | ICD-10-CM | POA: Diagnosis not present

## 2022-08-07 DIAGNOSIS — E119 Type 2 diabetes mellitus without complications: Secondary | ICD-10-CM | POA: Diagnosis not present

## 2022-08-08 DIAGNOSIS — Z736 Limitation of activities due to disability: Secondary | ICD-10-CM | POA: Diagnosis not present

## 2022-08-08 DIAGNOSIS — M25512 Pain in left shoulder: Secondary | ICD-10-CM | POA: Diagnosis not present

## 2022-08-08 DIAGNOSIS — E1121 Type 2 diabetes mellitus with diabetic nephropathy: Secondary | ICD-10-CM | POA: Diagnosis not present

## 2022-08-09 DIAGNOSIS — E119 Type 2 diabetes mellitus without complications: Secondary | ICD-10-CM | POA: Diagnosis not present

## 2022-08-09 DIAGNOSIS — F32A Depression, unspecified: Secondary | ICD-10-CM | POA: Diagnosis not present

## 2022-08-09 DIAGNOSIS — Z736 Limitation of activities due to disability: Secondary | ICD-10-CM | POA: Diagnosis not present

## 2022-08-09 DIAGNOSIS — M25512 Pain in left shoulder: Secondary | ICD-10-CM | POA: Diagnosis not present

## 2022-08-09 DIAGNOSIS — K219 Gastro-esophageal reflux disease without esophagitis: Secondary | ICD-10-CM | POA: Diagnosis not present

## 2022-08-09 DIAGNOSIS — I10 Essential (primary) hypertension: Secondary | ICD-10-CM | POA: Diagnosis not present

## 2022-08-09 DIAGNOSIS — E1121 Type 2 diabetes mellitus with diabetic nephropathy: Secondary | ICD-10-CM | POA: Diagnosis not present

## 2022-08-12 DIAGNOSIS — E1121 Type 2 diabetes mellitus with diabetic nephropathy: Secondary | ICD-10-CM | POA: Diagnosis not present

## 2022-08-12 DIAGNOSIS — Z736 Limitation of activities due to disability: Secondary | ICD-10-CM | POA: Diagnosis not present

## 2022-08-12 DIAGNOSIS — M25512 Pain in left shoulder: Secondary | ICD-10-CM | POA: Diagnosis not present

## 2022-08-13 DIAGNOSIS — Z736 Limitation of activities due to disability: Secondary | ICD-10-CM | POA: Diagnosis not present

## 2022-08-13 DIAGNOSIS — E1121 Type 2 diabetes mellitus with diabetic nephropathy: Secondary | ICD-10-CM | POA: Diagnosis not present

## 2022-08-13 DIAGNOSIS — M25512 Pain in left shoulder: Secondary | ICD-10-CM | POA: Diagnosis not present

## 2022-08-14 DIAGNOSIS — M25512 Pain in left shoulder: Secondary | ICD-10-CM | POA: Diagnosis not present

## 2022-08-14 DIAGNOSIS — E1121 Type 2 diabetes mellitus with diabetic nephropathy: Secondary | ICD-10-CM | POA: Diagnosis not present

## 2022-08-14 DIAGNOSIS — Z736 Limitation of activities due to disability: Secondary | ICD-10-CM | POA: Diagnosis not present

## 2022-08-15 DIAGNOSIS — Z736 Limitation of activities due to disability: Secondary | ICD-10-CM | POA: Diagnosis not present

## 2022-08-15 DIAGNOSIS — M25512 Pain in left shoulder: Secondary | ICD-10-CM | POA: Diagnosis not present

## 2022-08-15 DIAGNOSIS — E1121 Type 2 diabetes mellitus with diabetic nephropathy: Secondary | ICD-10-CM | POA: Diagnosis not present

## 2022-08-16 DIAGNOSIS — M25512 Pain in left shoulder: Secondary | ICD-10-CM | POA: Diagnosis not present

## 2022-08-16 DIAGNOSIS — E1121 Type 2 diabetes mellitus with diabetic nephropathy: Secondary | ICD-10-CM | POA: Diagnosis not present

## 2022-08-16 DIAGNOSIS — Z736 Limitation of activities due to disability: Secondary | ICD-10-CM | POA: Diagnosis not present

## 2022-08-19 DIAGNOSIS — M25512 Pain in left shoulder: Secondary | ICD-10-CM | POA: Diagnosis not present

## 2022-08-19 DIAGNOSIS — Z736 Limitation of activities due to disability: Secondary | ICD-10-CM | POA: Diagnosis not present

## 2022-08-19 DIAGNOSIS — E1121 Type 2 diabetes mellitus with diabetic nephropathy: Secondary | ICD-10-CM | POA: Diagnosis not present

## 2022-08-20 DIAGNOSIS — M25512 Pain in left shoulder: Secondary | ICD-10-CM | POA: Diagnosis not present

## 2022-08-20 DIAGNOSIS — Z736 Limitation of activities due to disability: Secondary | ICD-10-CM | POA: Diagnosis not present

## 2022-08-20 DIAGNOSIS — E1121 Type 2 diabetes mellitus with diabetic nephropathy: Secondary | ICD-10-CM | POA: Diagnosis not present

## 2022-08-21 DIAGNOSIS — E1121 Type 2 diabetes mellitus with diabetic nephropathy: Secondary | ICD-10-CM | POA: Diagnosis not present

## 2022-08-21 DIAGNOSIS — M25512 Pain in left shoulder: Secondary | ICD-10-CM | POA: Diagnosis not present

## 2022-08-21 DIAGNOSIS — Z736 Limitation of activities due to disability: Secondary | ICD-10-CM | POA: Diagnosis not present

## 2022-08-22 DIAGNOSIS — Z736 Limitation of activities due to disability: Secondary | ICD-10-CM | POA: Diagnosis not present

## 2022-08-22 DIAGNOSIS — I10 Essential (primary) hypertension: Secondary | ICD-10-CM | POA: Diagnosis not present

## 2022-08-22 DIAGNOSIS — M25512 Pain in left shoulder: Secondary | ICD-10-CM | POA: Diagnosis not present

## 2022-08-22 DIAGNOSIS — E1121 Type 2 diabetes mellitus with diabetic nephropathy: Secondary | ICD-10-CM | POA: Diagnosis not present

## 2022-08-22 DIAGNOSIS — M545 Low back pain, unspecified: Secondary | ICD-10-CM | POA: Diagnosis not present

## 2022-08-22 DIAGNOSIS — K219 Gastro-esophageal reflux disease without esophagitis: Secondary | ICD-10-CM | POA: Diagnosis not present

## 2022-08-22 DIAGNOSIS — L299 Pruritus, unspecified: Secondary | ICD-10-CM | POA: Diagnosis not present

## 2022-08-22 DIAGNOSIS — E119 Type 2 diabetes mellitus without complications: Secondary | ICD-10-CM | POA: Diagnosis not present

## 2022-08-23 DIAGNOSIS — E1121 Type 2 diabetes mellitus with diabetic nephropathy: Secondary | ICD-10-CM | POA: Diagnosis not present

## 2022-08-23 DIAGNOSIS — M25512 Pain in left shoulder: Secondary | ICD-10-CM | POA: Diagnosis not present

## 2022-08-23 DIAGNOSIS — Z736 Limitation of activities due to disability: Secondary | ICD-10-CM | POA: Diagnosis not present

## 2022-08-26 DIAGNOSIS — Z736 Limitation of activities due to disability: Secondary | ICD-10-CM | POA: Diagnosis not present

## 2022-08-26 DIAGNOSIS — E1121 Type 2 diabetes mellitus with diabetic nephropathy: Secondary | ICD-10-CM | POA: Diagnosis not present

## 2022-08-26 DIAGNOSIS — M25512 Pain in left shoulder: Secondary | ICD-10-CM | POA: Diagnosis not present

## 2022-08-27 DIAGNOSIS — E1121 Type 2 diabetes mellitus with diabetic nephropathy: Secondary | ICD-10-CM | POA: Diagnosis not present

## 2022-08-27 DIAGNOSIS — M25512 Pain in left shoulder: Secondary | ICD-10-CM | POA: Diagnosis not present

## 2022-08-27 DIAGNOSIS — Z736 Limitation of activities due to disability: Secondary | ICD-10-CM | POA: Diagnosis not present

## 2022-08-28 DIAGNOSIS — Z736 Limitation of activities due to disability: Secondary | ICD-10-CM | POA: Diagnosis not present

## 2022-08-28 DIAGNOSIS — E1121 Type 2 diabetes mellitus with diabetic nephropathy: Secondary | ICD-10-CM | POA: Diagnosis not present

## 2022-08-28 DIAGNOSIS — M25512 Pain in left shoulder: Secondary | ICD-10-CM | POA: Diagnosis not present

## 2022-08-29 DIAGNOSIS — M25512 Pain in left shoulder: Secondary | ICD-10-CM | POA: Diagnosis not present

## 2022-08-29 DIAGNOSIS — E1121 Type 2 diabetes mellitus with diabetic nephropathy: Secondary | ICD-10-CM | POA: Diagnosis not present

## 2022-08-29 DIAGNOSIS — Z736 Limitation of activities due to disability: Secondary | ICD-10-CM | POA: Diagnosis not present

## 2022-08-30 DIAGNOSIS — M25512 Pain in left shoulder: Secondary | ICD-10-CM | POA: Diagnosis not present

## 2022-08-30 DIAGNOSIS — Z736 Limitation of activities due to disability: Secondary | ICD-10-CM | POA: Diagnosis not present

## 2022-08-30 DIAGNOSIS — E1121 Type 2 diabetes mellitus with diabetic nephropathy: Secondary | ICD-10-CM | POA: Diagnosis not present

## 2022-09-02 DIAGNOSIS — M25512 Pain in left shoulder: Secondary | ICD-10-CM | POA: Diagnosis not present

## 2022-09-02 DIAGNOSIS — E1121 Type 2 diabetes mellitus with diabetic nephropathy: Secondary | ICD-10-CM | POA: Diagnosis not present

## 2022-09-02 DIAGNOSIS — Z736 Limitation of activities due to disability: Secondary | ICD-10-CM | POA: Diagnosis not present

## 2022-09-03 DIAGNOSIS — M25512 Pain in left shoulder: Secondary | ICD-10-CM | POA: Diagnosis not present

## 2022-09-03 DIAGNOSIS — E1121 Type 2 diabetes mellitus with diabetic nephropathy: Secondary | ICD-10-CM | POA: Diagnosis not present

## 2022-09-03 DIAGNOSIS — Z736 Limitation of activities due to disability: Secondary | ICD-10-CM | POA: Diagnosis not present

## 2022-09-04 DIAGNOSIS — Z736 Limitation of activities due to disability: Secondary | ICD-10-CM | POA: Diagnosis not present

## 2022-09-04 DIAGNOSIS — E1121 Type 2 diabetes mellitus with diabetic nephropathy: Secondary | ICD-10-CM | POA: Diagnosis not present

## 2022-09-04 DIAGNOSIS — M25512 Pain in left shoulder: Secondary | ICD-10-CM | POA: Diagnosis not present

## 2022-09-05 DIAGNOSIS — Z736 Limitation of activities due to disability: Secondary | ICD-10-CM | POA: Diagnosis not present

## 2022-09-05 DIAGNOSIS — E1121 Type 2 diabetes mellitus with diabetic nephropathy: Secondary | ICD-10-CM | POA: Diagnosis not present

## 2022-09-05 DIAGNOSIS — M25512 Pain in left shoulder: Secondary | ICD-10-CM | POA: Diagnosis not present

## 2022-09-08 DIAGNOSIS — M25512 Pain in left shoulder: Secondary | ICD-10-CM | POA: Diagnosis not present

## 2022-09-08 DIAGNOSIS — Z736 Limitation of activities due to disability: Secondary | ICD-10-CM | POA: Diagnosis not present

## 2022-09-08 DIAGNOSIS — E1121 Type 2 diabetes mellitus with diabetic nephropathy: Secondary | ICD-10-CM | POA: Diagnosis not present

## 2022-09-09 DIAGNOSIS — M25512 Pain in left shoulder: Secondary | ICD-10-CM | POA: Diagnosis not present

## 2022-09-09 DIAGNOSIS — E1121 Type 2 diabetes mellitus with diabetic nephropathy: Secondary | ICD-10-CM | POA: Diagnosis not present

## 2022-09-09 DIAGNOSIS — Z736 Limitation of activities due to disability: Secondary | ICD-10-CM | POA: Diagnosis not present

## 2022-09-10 DIAGNOSIS — M25512 Pain in left shoulder: Secondary | ICD-10-CM | POA: Diagnosis not present

## 2022-09-10 DIAGNOSIS — Z736 Limitation of activities due to disability: Secondary | ICD-10-CM | POA: Diagnosis not present

## 2022-09-10 DIAGNOSIS — R296 Repeated falls: Secondary | ICD-10-CM | POA: Diagnosis not present

## 2022-09-10 DIAGNOSIS — E1121 Type 2 diabetes mellitus with diabetic nephropathy: Secondary | ICD-10-CM | POA: Diagnosis not present

## 2022-09-11 DIAGNOSIS — I10 Essential (primary) hypertension: Secondary | ICD-10-CM | POA: Diagnosis not present

## 2022-09-11 DIAGNOSIS — E119 Type 2 diabetes mellitus without complications: Secondary | ICD-10-CM | POA: Diagnosis not present

## 2022-09-11 DIAGNOSIS — N189 Chronic kidney disease, unspecified: Secondary | ICD-10-CM | POA: Diagnosis not present

## 2022-09-11 DIAGNOSIS — I2510 Atherosclerotic heart disease of native coronary artery without angina pectoris: Secondary | ICD-10-CM | POA: Diagnosis not present

## 2022-09-12 DIAGNOSIS — N390 Urinary tract infection, site not specified: Secondary | ICD-10-CM | POA: Diagnosis not present

## 2022-09-12 DIAGNOSIS — E1121 Type 2 diabetes mellitus with diabetic nephropathy: Secondary | ICD-10-CM | POA: Diagnosis not present

## 2022-09-12 DIAGNOSIS — Z736 Limitation of activities due to disability: Secondary | ICD-10-CM | POA: Diagnosis not present

## 2022-09-12 DIAGNOSIS — M25512 Pain in left shoulder: Secondary | ICD-10-CM | POA: Diagnosis not present

## 2022-09-13 DIAGNOSIS — Z736 Limitation of activities due to disability: Secondary | ICD-10-CM | POA: Diagnosis not present

## 2022-09-13 DIAGNOSIS — M25512 Pain in left shoulder: Secondary | ICD-10-CM | POA: Diagnosis not present

## 2022-09-13 DIAGNOSIS — E1121 Type 2 diabetes mellitus with diabetic nephropathy: Secondary | ICD-10-CM | POA: Diagnosis not present

## 2022-09-14 DIAGNOSIS — M25512 Pain in left shoulder: Secondary | ICD-10-CM | POA: Diagnosis not present

## 2022-09-14 DIAGNOSIS — Z736 Limitation of activities due to disability: Secondary | ICD-10-CM | POA: Diagnosis not present

## 2022-09-14 DIAGNOSIS — E1121 Type 2 diabetes mellitus with diabetic nephropathy: Secondary | ICD-10-CM | POA: Diagnosis not present

## 2022-09-15 DIAGNOSIS — M25512 Pain in left shoulder: Secondary | ICD-10-CM | POA: Diagnosis not present

## 2022-09-15 DIAGNOSIS — E1121 Type 2 diabetes mellitus with diabetic nephropathy: Secondary | ICD-10-CM | POA: Diagnosis not present

## 2022-09-15 DIAGNOSIS — Z736 Limitation of activities due to disability: Secondary | ICD-10-CM | POA: Diagnosis not present

## 2022-09-16 DIAGNOSIS — H353134 Nonexudative age-related macular degeneration, bilateral, advanced atrophic with subfoveal involvement: Secondary | ICD-10-CM | POA: Diagnosis not present

## 2022-09-16 DIAGNOSIS — H524 Presbyopia: Secondary | ICD-10-CM | POA: Diagnosis not present

## 2022-09-16 DIAGNOSIS — H25813 Combined forms of age-related cataract, bilateral: Secondary | ICD-10-CM | POA: Diagnosis not present

## 2022-09-17 DIAGNOSIS — Z736 Limitation of activities due to disability: Secondary | ICD-10-CM | POA: Diagnosis not present

## 2022-09-17 DIAGNOSIS — E1121 Type 2 diabetes mellitus with diabetic nephropathy: Secondary | ICD-10-CM | POA: Diagnosis not present

## 2022-09-17 DIAGNOSIS — M25512 Pain in left shoulder: Secondary | ICD-10-CM | POA: Diagnosis not present

## 2022-09-19 DIAGNOSIS — E1121 Type 2 diabetes mellitus with diabetic nephropathy: Secondary | ICD-10-CM | POA: Diagnosis not present

## 2022-09-19 DIAGNOSIS — Z736 Limitation of activities due to disability: Secondary | ICD-10-CM | POA: Diagnosis not present

## 2022-09-19 DIAGNOSIS — M25512 Pain in left shoulder: Secondary | ICD-10-CM | POA: Diagnosis not present

## 2022-09-20 DIAGNOSIS — Z736 Limitation of activities due to disability: Secondary | ICD-10-CM | POA: Diagnosis not present

## 2022-09-20 DIAGNOSIS — E1121 Type 2 diabetes mellitus with diabetic nephropathy: Secondary | ICD-10-CM | POA: Diagnosis not present

## 2022-09-20 DIAGNOSIS — M25512 Pain in left shoulder: Secondary | ICD-10-CM | POA: Diagnosis not present

## 2022-09-22 DIAGNOSIS — M25512 Pain in left shoulder: Secondary | ICD-10-CM | POA: Diagnosis not present

## 2022-09-22 DIAGNOSIS — Z736 Limitation of activities due to disability: Secondary | ICD-10-CM | POA: Diagnosis not present

## 2022-09-22 DIAGNOSIS — E1121 Type 2 diabetes mellitus with diabetic nephropathy: Secondary | ICD-10-CM | POA: Diagnosis not present

## 2022-09-23 DIAGNOSIS — M25512 Pain in left shoulder: Secondary | ICD-10-CM | POA: Diagnosis not present

## 2022-09-23 DIAGNOSIS — Z736 Limitation of activities due to disability: Secondary | ICD-10-CM | POA: Diagnosis not present

## 2022-09-23 DIAGNOSIS — E1121 Type 2 diabetes mellitus with diabetic nephropathy: Secondary | ICD-10-CM | POA: Diagnosis not present

## 2022-09-24 DIAGNOSIS — Z736 Limitation of activities due to disability: Secondary | ICD-10-CM | POA: Diagnosis not present

## 2022-09-24 DIAGNOSIS — E1121 Type 2 diabetes mellitus with diabetic nephropathy: Secondary | ICD-10-CM | POA: Diagnosis not present

## 2022-09-24 DIAGNOSIS — M25512 Pain in left shoulder: Secondary | ICD-10-CM | POA: Diagnosis not present

## 2022-09-25 DIAGNOSIS — M25512 Pain in left shoulder: Secondary | ICD-10-CM | POA: Diagnosis not present

## 2022-09-25 DIAGNOSIS — E1121 Type 2 diabetes mellitus with diabetic nephropathy: Secondary | ICD-10-CM | POA: Diagnosis not present

## 2022-09-25 DIAGNOSIS — Z736 Limitation of activities due to disability: Secondary | ICD-10-CM | POA: Diagnosis not present

## 2022-09-26 DIAGNOSIS — I10 Essential (primary) hypertension: Secondary | ICD-10-CM | POA: Diagnosis not present

## 2022-09-26 DIAGNOSIS — I214 Non-ST elevation (NSTEMI) myocardial infarction: Secondary | ICD-10-CM | POA: Diagnosis not present

## 2022-09-26 DIAGNOSIS — N189 Chronic kidney disease, unspecified: Secondary | ICD-10-CM | POA: Diagnosis not present

## 2022-09-26 DIAGNOSIS — Z736 Limitation of activities due to disability: Secondary | ICD-10-CM | POA: Diagnosis not present

## 2022-09-26 DIAGNOSIS — M25512 Pain in left shoulder: Secondary | ICD-10-CM | POA: Diagnosis not present

## 2022-09-26 DIAGNOSIS — M545 Low back pain, unspecified: Secondary | ICD-10-CM | POA: Diagnosis not present

## 2022-09-26 DIAGNOSIS — E119 Type 2 diabetes mellitus without complications: Secondary | ICD-10-CM | POA: Diagnosis not present

## 2022-09-26 DIAGNOSIS — E1121 Type 2 diabetes mellitus with diabetic nephropathy: Secondary | ICD-10-CM | POA: Diagnosis not present

## 2022-09-27 DIAGNOSIS — Z736 Limitation of activities due to disability: Secondary | ICD-10-CM | POA: Diagnosis not present

## 2022-09-27 DIAGNOSIS — M25512 Pain in left shoulder: Secondary | ICD-10-CM | POA: Diagnosis not present

## 2022-09-27 DIAGNOSIS — E1121 Type 2 diabetes mellitus with diabetic nephropathy: Secondary | ICD-10-CM | POA: Diagnosis not present

## 2022-09-30 DIAGNOSIS — Z736 Limitation of activities due to disability: Secondary | ICD-10-CM | POA: Diagnosis not present

## 2022-09-30 DIAGNOSIS — R296 Repeated falls: Secondary | ICD-10-CM | POA: Diagnosis not present

## 2022-09-30 DIAGNOSIS — M25512 Pain in left shoulder: Secondary | ICD-10-CM | POA: Diagnosis not present

## 2022-09-30 DIAGNOSIS — E1121 Type 2 diabetes mellitus with diabetic nephropathy: Secondary | ICD-10-CM | POA: Diagnosis not present

## 2022-10-01 DIAGNOSIS — Z736 Limitation of activities due to disability: Secondary | ICD-10-CM | POA: Diagnosis not present

## 2022-10-01 DIAGNOSIS — M25512 Pain in left shoulder: Secondary | ICD-10-CM | POA: Diagnosis not present

## 2022-10-01 DIAGNOSIS — E1121 Type 2 diabetes mellitus with diabetic nephropathy: Secondary | ICD-10-CM | POA: Diagnosis not present

## 2022-10-03 DIAGNOSIS — E1121 Type 2 diabetes mellitus with diabetic nephropathy: Secondary | ICD-10-CM | POA: Diagnosis not present

## 2022-10-03 DIAGNOSIS — M25512 Pain in left shoulder: Secondary | ICD-10-CM | POA: Diagnosis not present

## 2022-10-03 DIAGNOSIS — Z736 Limitation of activities due to disability: Secondary | ICD-10-CM | POA: Diagnosis not present

## 2022-10-05 DIAGNOSIS — E1121 Type 2 diabetes mellitus with diabetic nephropathy: Secondary | ICD-10-CM | POA: Diagnosis not present

## 2022-10-05 DIAGNOSIS — M25512 Pain in left shoulder: Secondary | ICD-10-CM | POA: Diagnosis not present

## 2022-10-05 DIAGNOSIS — Z736 Limitation of activities due to disability: Secondary | ICD-10-CM | POA: Diagnosis not present

## 2022-10-06 DIAGNOSIS — E1121 Type 2 diabetes mellitus with diabetic nephropathy: Secondary | ICD-10-CM | POA: Diagnosis not present

## 2022-10-06 DIAGNOSIS — M25512 Pain in left shoulder: Secondary | ICD-10-CM | POA: Diagnosis not present

## 2022-10-06 DIAGNOSIS — Z736 Limitation of activities due to disability: Secondary | ICD-10-CM | POA: Diagnosis not present

## 2022-10-07 DIAGNOSIS — Z736 Limitation of activities due to disability: Secondary | ICD-10-CM | POA: Diagnosis not present

## 2022-10-07 DIAGNOSIS — E1121 Type 2 diabetes mellitus with diabetic nephropathy: Secondary | ICD-10-CM | POA: Diagnosis not present

## 2022-10-07 DIAGNOSIS — M25512 Pain in left shoulder: Secondary | ICD-10-CM | POA: Diagnosis not present

## 2022-10-07 DIAGNOSIS — E119 Type 2 diabetes mellitus without complications: Secondary | ICD-10-CM | POA: Diagnosis not present

## 2022-10-07 DIAGNOSIS — M109 Gout, unspecified: Secondary | ICD-10-CM | POA: Diagnosis not present

## 2022-10-07 DIAGNOSIS — I10 Essential (primary) hypertension: Secondary | ICD-10-CM | POA: Diagnosis not present

## 2022-10-07 DIAGNOSIS — F32A Depression, unspecified: Secondary | ICD-10-CM | POA: Diagnosis not present

## 2022-10-07 DIAGNOSIS — E785 Hyperlipidemia, unspecified: Secondary | ICD-10-CM | POA: Diagnosis not present

## 2022-10-08 DIAGNOSIS — E1121 Type 2 diabetes mellitus with diabetic nephropathy: Secondary | ICD-10-CM | POA: Diagnosis not present

## 2022-10-08 DIAGNOSIS — M25512 Pain in left shoulder: Secondary | ICD-10-CM | POA: Diagnosis not present

## 2022-10-08 DIAGNOSIS — Z736 Limitation of activities due to disability: Secondary | ICD-10-CM | POA: Diagnosis not present

## 2022-10-09 DIAGNOSIS — Z736 Limitation of activities due to disability: Secondary | ICD-10-CM | POA: Diagnosis not present

## 2022-10-09 DIAGNOSIS — N189 Chronic kidney disease, unspecified: Secondary | ICD-10-CM | POA: Diagnosis not present

## 2022-10-09 DIAGNOSIS — E119 Type 2 diabetes mellitus without complications: Secondary | ICD-10-CM | POA: Diagnosis not present

## 2022-10-09 DIAGNOSIS — M25512 Pain in left shoulder: Secondary | ICD-10-CM | POA: Diagnosis not present

## 2022-10-09 DIAGNOSIS — I2510 Atherosclerotic heart disease of native coronary artery without angina pectoris: Secondary | ICD-10-CM | POA: Diagnosis not present

## 2022-10-09 DIAGNOSIS — I10 Essential (primary) hypertension: Secondary | ICD-10-CM | POA: Diagnosis not present

## 2022-10-09 DIAGNOSIS — E1121 Type 2 diabetes mellitus with diabetic nephropathy: Secondary | ICD-10-CM | POA: Diagnosis not present

## 2022-10-11 DIAGNOSIS — Z736 Limitation of activities due to disability: Secondary | ICD-10-CM | POA: Diagnosis not present

## 2022-10-11 DIAGNOSIS — M25512 Pain in left shoulder: Secondary | ICD-10-CM | POA: Diagnosis not present

## 2022-10-11 DIAGNOSIS — E1121 Type 2 diabetes mellitus with diabetic nephropathy: Secondary | ICD-10-CM | POA: Diagnosis not present

## 2022-10-12 DIAGNOSIS — Z736 Limitation of activities due to disability: Secondary | ICD-10-CM | POA: Diagnosis not present

## 2022-10-12 DIAGNOSIS — E1121 Type 2 diabetes mellitus with diabetic nephropathy: Secondary | ICD-10-CM | POA: Diagnosis not present

## 2022-10-12 DIAGNOSIS — M25512 Pain in left shoulder: Secondary | ICD-10-CM | POA: Diagnosis not present

## 2022-10-14 DIAGNOSIS — Z736 Limitation of activities due to disability: Secondary | ICD-10-CM | POA: Diagnosis not present

## 2022-10-14 DIAGNOSIS — R296 Repeated falls: Secondary | ICD-10-CM | POA: Diagnosis not present

## 2022-10-14 DIAGNOSIS — M25512 Pain in left shoulder: Secondary | ICD-10-CM | POA: Diagnosis not present

## 2022-10-14 DIAGNOSIS — E1121 Type 2 diabetes mellitus with diabetic nephropathy: Secondary | ICD-10-CM | POA: Diagnosis not present

## 2022-10-15 DIAGNOSIS — Z736 Limitation of activities due to disability: Secondary | ICD-10-CM | POA: Diagnosis not present

## 2022-10-15 DIAGNOSIS — E119 Type 2 diabetes mellitus without complications: Secondary | ICD-10-CM | POA: Diagnosis not present

## 2022-10-15 DIAGNOSIS — M25512 Pain in left shoulder: Secondary | ICD-10-CM | POA: Diagnosis not present

## 2022-10-15 DIAGNOSIS — E1121 Type 2 diabetes mellitus with diabetic nephropathy: Secondary | ICD-10-CM | POA: Diagnosis not present

## 2022-10-16 DIAGNOSIS — M25512 Pain in left shoulder: Secondary | ICD-10-CM | POA: Diagnosis not present

## 2022-10-16 DIAGNOSIS — E1121 Type 2 diabetes mellitus with diabetic nephropathy: Secondary | ICD-10-CM | POA: Diagnosis not present

## 2022-10-16 DIAGNOSIS — Z736 Limitation of activities due to disability: Secondary | ICD-10-CM | POA: Diagnosis not present

## 2022-10-16 DIAGNOSIS — L602 Onychogryphosis: Secondary | ICD-10-CM | POA: Diagnosis not present

## 2022-10-16 DIAGNOSIS — I739 Peripheral vascular disease, unspecified: Secondary | ICD-10-CM | POA: Diagnosis not present

## 2022-10-21 DIAGNOSIS — E1121 Type 2 diabetes mellitus with diabetic nephropathy: Secondary | ICD-10-CM | POA: Diagnosis not present

## 2022-10-21 DIAGNOSIS — M6281 Muscle weakness (generalized): Secondary | ICD-10-CM | POA: Diagnosis not present

## 2022-10-22 DIAGNOSIS — M6281 Muscle weakness (generalized): Secondary | ICD-10-CM | POA: Diagnosis not present

## 2022-10-22 DIAGNOSIS — E1121 Type 2 diabetes mellitus with diabetic nephropathy: Secondary | ICD-10-CM | POA: Diagnosis not present

## 2022-10-23 DIAGNOSIS — E1121 Type 2 diabetes mellitus with diabetic nephropathy: Secondary | ICD-10-CM | POA: Diagnosis not present

## 2022-10-23 DIAGNOSIS — M6281 Muscle weakness (generalized): Secondary | ICD-10-CM | POA: Diagnosis not present

## 2022-10-24 DIAGNOSIS — F32A Depression, unspecified: Secondary | ICD-10-CM | POA: Diagnosis not present

## 2022-10-24 DIAGNOSIS — I10 Essential (primary) hypertension: Secondary | ICD-10-CM | POA: Diagnosis not present

## 2022-10-24 DIAGNOSIS — E785 Hyperlipidemia, unspecified: Secondary | ICD-10-CM | POA: Diagnosis not present

## 2022-10-24 DIAGNOSIS — R296 Repeated falls: Secondary | ICD-10-CM | POA: Diagnosis not present

## 2022-10-25 DIAGNOSIS — E1121 Type 2 diabetes mellitus with diabetic nephropathy: Secondary | ICD-10-CM | POA: Diagnosis not present

## 2022-10-25 DIAGNOSIS — M6281 Muscle weakness (generalized): Secondary | ICD-10-CM | POA: Diagnosis not present

## 2022-10-31 DIAGNOSIS — M6281 Muscle weakness (generalized): Secondary | ICD-10-CM | POA: Diagnosis not present

## 2022-10-31 DIAGNOSIS — E1121 Type 2 diabetes mellitus with diabetic nephropathy: Secondary | ICD-10-CM | POA: Diagnosis not present

## 2022-11-01 DIAGNOSIS — M6281 Muscle weakness (generalized): Secondary | ICD-10-CM | POA: Diagnosis not present

## 2022-11-01 DIAGNOSIS — E1121 Type 2 diabetes mellitus with diabetic nephropathy: Secondary | ICD-10-CM | POA: Diagnosis not present

## 2022-11-04 DIAGNOSIS — E785 Hyperlipidemia, unspecified: Secondary | ICD-10-CM | POA: Diagnosis not present

## 2022-11-04 DIAGNOSIS — M6281 Muscle weakness (generalized): Secondary | ICD-10-CM | POA: Diagnosis not present

## 2022-11-04 DIAGNOSIS — K219 Gastro-esophageal reflux disease without esophagitis: Secondary | ICD-10-CM | POA: Diagnosis not present

## 2022-11-04 DIAGNOSIS — E1121 Type 2 diabetes mellitus with diabetic nephropathy: Secondary | ICD-10-CM | POA: Diagnosis not present

## 2022-11-04 DIAGNOSIS — I10 Essential (primary) hypertension: Secondary | ICD-10-CM | POA: Diagnosis not present

## 2022-11-04 DIAGNOSIS — H3530 Unspecified macular degeneration: Secondary | ICD-10-CM | POA: Diagnosis not present

## 2022-11-04 DIAGNOSIS — F32A Depression, unspecified: Secondary | ICD-10-CM | POA: Diagnosis not present

## 2022-11-04 DIAGNOSIS — E119 Type 2 diabetes mellitus without complications: Secondary | ICD-10-CM | POA: Diagnosis not present

## 2022-11-04 DIAGNOSIS — M109 Gout, unspecified: Secondary | ICD-10-CM | POA: Diagnosis not present

## 2022-11-05 DIAGNOSIS — E1121 Type 2 diabetes mellitus with diabetic nephropathy: Secondary | ICD-10-CM | POA: Diagnosis not present

## 2022-11-05 DIAGNOSIS — M6281 Muscle weakness (generalized): Secondary | ICD-10-CM | POA: Diagnosis not present

## 2022-11-06 DIAGNOSIS — E1121 Type 2 diabetes mellitus with diabetic nephropathy: Secondary | ICD-10-CM | POA: Diagnosis not present

## 2022-11-06 DIAGNOSIS — E119 Type 2 diabetes mellitus without complications: Secondary | ICD-10-CM | POA: Diagnosis not present

## 2022-11-06 DIAGNOSIS — N189 Chronic kidney disease, unspecified: Secondary | ICD-10-CM | POA: Diagnosis not present

## 2022-11-06 DIAGNOSIS — M6281 Muscle weakness (generalized): Secondary | ICD-10-CM | POA: Diagnosis not present

## 2022-11-06 DIAGNOSIS — I10 Essential (primary) hypertension: Secondary | ICD-10-CM | POA: Diagnosis not present

## 2022-11-06 DIAGNOSIS — R296 Repeated falls: Secondary | ICD-10-CM | POA: Diagnosis not present

## 2022-11-06 DIAGNOSIS — I2510 Atherosclerotic heart disease of native coronary artery without angina pectoris: Secondary | ICD-10-CM | POA: Diagnosis not present

## 2022-11-06 DIAGNOSIS — M533 Sacrococcygeal disorders, not elsewhere classified: Secondary | ICD-10-CM | POA: Diagnosis not present

## 2022-11-07 DIAGNOSIS — M6281 Muscle weakness (generalized): Secondary | ICD-10-CM | POA: Diagnosis not present

## 2022-11-07 DIAGNOSIS — E1121 Type 2 diabetes mellitus with diabetic nephropathy: Secondary | ICD-10-CM | POA: Diagnosis not present

## 2022-11-09 DIAGNOSIS — R809 Proteinuria, unspecified: Secondary | ICD-10-CM | POA: Diagnosis not present

## 2022-11-13 DIAGNOSIS — M6281 Muscle weakness (generalized): Secondary | ICD-10-CM | POA: Diagnosis not present

## 2022-11-13 DIAGNOSIS — E1121 Type 2 diabetes mellitus with diabetic nephropathy: Secondary | ICD-10-CM | POA: Diagnosis not present

## 2022-11-14 DIAGNOSIS — E1121 Type 2 diabetes mellitus with diabetic nephropathy: Secondary | ICD-10-CM | POA: Diagnosis not present

## 2022-11-14 DIAGNOSIS — I2510 Atherosclerotic heart disease of native coronary artery without angina pectoris: Secondary | ICD-10-CM | POA: Diagnosis not present

## 2022-11-14 DIAGNOSIS — K219 Gastro-esophageal reflux disease without esophagitis: Secondary | ICD-10-CM | POA: Diagnosis not present

## 2022-11-14 DIAGNOSIS — M6281 Muscle weakness (generalized): Secondary | ICD-10-CM | POA: Diagnosis not present

## 2022-11-14 DIAGNOSIS — E119 Type 2 diabetes mellitus without complications: Secondary | ICD-10-CM | POA: Diagnosis not present

## 2022-11-15 DIAGNOSIS — M6281 Muscle weakness (generalized): Secondary | ICD-10-CM | POA: Diagnosis not present

## 2022-11-15 DIAGNOSIS — E1121 Type 2 diabetes mellitus with diabetic nephropathy: Secondary | ICD-10-CM | POA: Diagnosis not present

## 2022-11-19 DIAGNOSIS — M6281 Muscle weakness (generalized): Secondary | ICD-10-CM | POA: Diagnosis not present

## 2022-11-19 DIAGNOSIS — E1121 Type 2 diabetes mellitus with diabetic nephropathy: Secondary | ICD-10-CM | POA: Diagnosis not present

## 2022-11-19 DIAGNOSIS — R279 Unspecified lack of coordination: Secondary | ICD-10-CM | POA: Diagnosis not present

## 2022-11-20 DIAGNOSIS — R279 Unspecified lack of coordination: Secondary | ICD-10-CM | POA: Diagnosis not present

## 2022-11-20 DIAGNOSIS — M6281 Muscle weakness (generalized): Secondary | ICD-10-CM | POA: Diagnosis not present

## 2022-11-20 DIAGNOSIS — E1121 Type 2 diabetes mellitus with diabetic nephropathy: Secondary | ICD-10-CM | POA: Diagnosis not present

## 2022-11-22 DIAGNOSIS — R279 Unspecified lack of coordination: Secondary | ICD-10-CM | POA: Diagnosis not present

## 2022-11-22 DIAGNOSIS — E1121 Type 2 diabetes mellitus with diabetic nephropathy: Secondary | ICD-10-CM | POA: Diagnosis not present

## 2022-11-22 DIAGNOSIS — M6281 Muscle weakness (generalized): Secondary | ICD-10-CM | POA: Diagnosis not present

## 2022-11-22 DIAGNOSIS — R296 Repeated falls: Secondary | ICD-10-CM | POA: Diagnosis not present

## 2022-11-23 DIAGNOSIS — E1121 Type 2 diabetes mellitus with diabetic nephropathy: Secondary | ICD-10-CM | POA: Diagnosis not present

## 2022-11-23 DIAGNOSIS — R279 Unspecified lack of coordination: Secondary | ICD-10-CM | POA: Diagnosis not present

## 2022-11-23 DIAGNOSIS — M6281 Muscle weakness (generalized): Secondary | ICD-10-CM | POA: Diagnosis not present

## 2022-11-24 DIAGNOSIS — E1121 Type 2 diabetes mellitus with diabetic nephropathy: Secondary | ICD-10-CM | POA: Diagnosis not present

## 2022-11-24 DIAGNOSIS — R279 Unspecified lack of coordination: Secondary | ICD-10-CM | POA: Diagnosis not present

## 2022-11-24 DIAGNOSIS — M6281 Muscle weakness (generalized): Secondary | ICD-10-CM | POA: Diagnosis not present

## 2022-11-25 DIAGNOSIS — R296 Repeated falls: Secondary | ICD-10-CM | POA: Diagnosis not present

## 2022-11-25 DIAGNOSIS — E1121 Type 2 diabetes mellitus with diabetic nephropathy: Secondary | ICD-10-CM | POA: Diagnosis not present

## 2022-11-25 DIAGNOSIS — M542 Cervicalgia: Secondary | ICD-10-CM | POA: Diagnosis not present

## 2022-11-25 DIAGNOSIS — R279 Unspecified lack of coordination: Secondary | ICD-10-CM | POA: Diagnosis not present

## 2022-11-25 DIAGNOSIS — M6281 Muscle weakness (generalized): Secondary | ICD-10-CM | POA: Diagnosis not present

## 2022-11-26 DIAGNOSIS — M6281 Muscle weakness (generalized): Secondary | ICD-10-CM | POA: Diagnosis not present

## 2022-11-26 DIAGNOSIS — E1121 Type 2 diabetes mellitus with diabetic nephropathy: Secondary | ICD-10-CM | POA: Diagnosis not present

## 2022-11-26 DIAGNOSIS — M542 Cervicalgia: Secondary | ICD-10-CM | POA: Diagnosis not present

## 2022-11-26 DIAGNOSIS — R279 Unspecified lack of coordination: Secondary | ICD-10-CM | POA: Diagnosis not present

## 2022-11-26 DIAGNOSIS — Z9181 History of falling: Secondary | ICD-10-CM | POA: Diagnosis not present

## 2022-11-27 DIAGNOSIS — Z9181 History of falling: Secondary | ICD-10-CM | POA: Diagnosis not present

## 2022-11-27 DIAGNOSIS — E1121 Type 2 diabetes mellitus with diabetic nephropathy: Secondary | ICD-10-CM | POA: Diagnosis not present

## 2022-11-27 DIAGNOSIS — M6281 Muscle weakness (generalized): Secondary | ICD-10-CM | POA: Diagnosis not present

## 2022-11-27 DIAGNOSIS — R279 Unspecified lack of coordination: Secondary | ICD-10-CM | POA: Diagnosis not present

## 2022-11-29 DIAGNOSIS — R279 Unspecified lack of coordination: Secondary | ICD-10-CM | POA: Diagnosis not present

## 2022-11-29 DIAGNOSIS — E1121 Type 2 diabetes mellitus with diabetic nephropathy: Secondary | ICD-10-CM | POA: Diagnosis not present

## 2022-11-29 DIAGNOSIS — M6281 Muscle weakness (generalized): Secondary | ICD-10-CM | POA: Diagnosis not present

## 2022-11-30 DIAGNOSIS — R279 Unspecified lack of coordination: Secondary | ICD-10-CM | POA: Diagnosis not present

## 2022-11-30 DIAGNOSIS — M6281 Muscle weakness (generalized): Secondary | ICD-10-CM | POA: Diagnosis not present

## 2022-11-30 DIAGNOSIS — E1121 Type 2 diabetes mellitus with diabetic nephropathy: Secondary | ICD-10-CM | POA: Diagnosis not present

## 2022-12-01 DIAGNOSIS — M6281 Muscle weakness (generalized): Secondary | ICD-10-CM | POA: Diagnosis not present

## 2022-12-01 DIAGNOSIS — E1121 Type 2 diabetes mellitus with diabetic nephropathy: Secondary | ICD-10-CM | POA: Diagnosis not present

## 2022-12-01 DIAGNOSIS — R279 Unspecified lack of coordination: Secondary | ICD-10-CM | POA: Diagnosis not present

## 2022-12-02 DIAGNOSIS — R279 Unspecified lack of coordination: Secondary | ICD-10-CM | POA: Diagnosis not present

## 2022-12-02 DIAGNOSIS — M109 Gout, unspecified: Secondary | ICD-10-CM | POA: Diagnosis not present

## 2022-12-02 DIAGNOSIS — I10 Essential (primary) hypertension: Secondary | ICD-10-CM | POA: Diagnosis not present

## 2022-12-02 DIAGNOSIS — E785 Hyperlipidemia, unspecified: Secondary | ICD-10-CM | POA: Diagnosis not present

## 2022-12-02 DIAGNOSIS — M6281 Muscle weakness (generalized): Secondary | ICD-10-CM | POA: Diagnosis not present

## 2022-12-02 DIAGNOSIS — E1121 Type 2 diabetes mellitus with diabetic nephropathy: Secondary | ICD-10-CM | POA: Diagnosis not present

## 2022-12-02 DIAGNOSIS — E119 Type 2 diabetes mellitus without complications: Secondary | ICD-10-CM | POA: Diagnosis not present

## 2022-12-03 DIAGNOSIS — E1121 Type 2 diabetes mellitus with diabetic nephropathy: Secondary | ICD-10-CM | POA: Diagnosis not present

## 2022-12-03 DIAGNOSIS — M6281 Muscle weakness (generalized): Secondary | ICD-10-CM | POA: Diagnosis not present

## 2022-12-03 DIAGNOSIS — R279 Unspecified lack of coordination: Secondary | ICD-10-CM | POA: Diagnosis not present

## 2022-12-04 DIAGNOSIS — R279 Unspecified lack of coordination: Secondary | ICD-10-CM | POA: Diagnosis not present

## 2022-12-04 DIAGNOSIS — E1121 Type 2 diabetes mellitus with diabetic nephropathy: Secondary | ICD-10-CM | POA: Diagnosis not present

## 2022-12-04 DIAGNOSIS — M6281 Muscle weakness (generalized): Secondary | ICD-10-CM | POA: Diagnosis not present

## 2022-12-06 DIAGNOSIS — I2510 Atherosclerotic heart disease of native coronary artery without angina pectoris: Secondary | ICD-10-CM | POA: Diagnosis not present

## 2022-12-06 DIAGNOSIS — N189 Chronic kidney disease, unspecified: Secondary | ICD-10-CM | POA: Diagnosis not present

## 2022-12-06 DIAGNOSIS — M6281 Muscle weakness (generalized): Secondary | ICD-10-CM | POA: Diagnosis not present

## 2022-12-06 DIAGNOSIS — E1121 Type 2 diabetes mellitus with diabetic nephropathy: Secondary | ICD-10-CM | POA: Diagnosis not present

## 2022-12-06 DIAGNOSIS — E119 Type 2 diabetes mellitus without complications: Secondary | ICD-10-CM | POA: Diagnosis not present

## 2022-12-06 DIAGNOSIS — I10 Essential (primary) hypertension: Secondary | ICD-10-CM | POA: Diagnosis not present

## 2022-12-06 DIAGNOSIS — R279 Unspecified lack of coordination: Secondary | ICD-10-CM | POA: Diagnosis not present

## 2022-12-07 DIAGNOSIS — E1121 Type 2 diabetes mellitus with diabetic nephropathy: Secondary | ICD-10-CM | POA: Diagnosis not present

## 2022-12-07 DIAGNOSIS — M6281 Muscle weakness (generalized): Secondary | ICD-10-CM | POA: Diagnosis not present

## 2022-12-07 DIAGNOSIS — R279 Unspecified lack of coordination: Secondary | ICD-10-CM | POA: Diagnosis not present

## 2022-12-09 DIAGNOSIS — E1121 Type 2 diabetes mellitus with diabetic nephropathy: Secondary | ICD-10-CM | POA: Diagnosis not present

## 2022-12-09 DIAGNOSIS — M6281 Muscle weakness (generalized): Secondary | ICD-10-CM | POA: Diagnosis not present

## 2022-12-09 DIAGNOSIS — R279 Unspecified lack of coordination: Secondary | ICD-10-CM | POA: Diagnosis not present

## 2022-12-10 DIAGNOSIS — N390 Urinary tract infection, site not specified: Secondary | ICD-10-CM | POA: Diagnosis not present

## 2022-12-10 DIAGNOSIS — M6281 Muscle weakness (generalized): Secondary | ICD-10-CM | POA: Diagnosis not present

## 2022-12-10 DIAGNOSIS — R279 Unspecified lack of coordination: Secondary | ICD-10-CM | POA: Diagnosis not present

## 2022-12-10 DIAGNOSIS — E1121 Type 2 diabetes mellitus with diabetic nephropathy: Secondary | ICD-10-CM | POA: Diagnosis not present

## 2022-12-11 DIAGNOSIS — E1121 Type 2 diabetes mellitus with diabetic nephropathy: Secondary | ICD-10-CM | POA: Diagnosis not present

## 2022-12-11 DIAGNOSIS — M6281 Muscle weakness (generalized): Secondary | ICD-10-CM | POA: Diagnosis not present

## 2022-12-11 DIAGNOSIS — R279 Unspecified lack of coordination: Secondary | ICD-10-CM | POA: Diagnosis not present

## 2022-12-12 DIAGNOSIS — M6281 Muscle weakness (generalized): Secondary | ICD-10-CM | POA: Diagnosis not present

## 2022-12-12 DIAGNOSIS — E1121 Type 2 diabetes mellitus with diabetic nephropathy: Secondary | ICD-10-CM | POA: Diagnosis not present

## 2022-12-12 DIAGNOSIS — R279 Unspecified lack of coordination: Secondary | ICD-10-CM | POA: Diagnosis not present

## 2022-12-14 ENCOUNTER — Encounter (HOSPITAL_COMMUNITY): Payer: Self-pay

## 2022-12-14 ENCOUNTER — Emergency Department (HOSPITAL_COMMUNITY): Payer: 59

## 2022-12-14 ENCOUNTER — Emergency Department (HOSPITAL_COMMUNITY)
Admission: EM | Admit: 2022-12-14 | Discharge: 2022-12-15 | Disposition: A | Discharge status: Home / Self Care | Payer: 59 | Attending: Emergency Medicine | Admitting: Emergency Medicine

## 2022-12-14 ENCOUNTER — Other Ambulatory Visit: Payer: Self-pay

## 2022-12-14 DIAGNOSIS — I499 Cardiac arrhythmia, unspecified: Secondary | ICD-10-CM | POA: Diagnosis not present

## 2022-12-14 DIAGNOSIS — M47812 Spondylosis without myelopathy or radiculopathy, cervical region: Secondary | ICD-10-CM | POA: Diagnosis not present

## 2022-12-14 DIAGNOSIS — G9389 Other specified disorders of brain: Secondary | ICD-10-CM | POA: Diagnosis not present

## 2022-12-14 DIAGNOSIS — R531 Weakness: Secondary | ICD-10-CM | POA: Insufficient documentation

## 2022-12-14 DIAGNOSIS — M6281 Muscle weakness (generalized): Secondary | ICD-10-CM | POA: Diagnosis not present

## 2022-12-14 DIAGNOSIS — E1121 Type 2 diabetes mellitus with diabetic nephropathy: Secondary | ICD-10-CM | POA: Diagnosis not present

## 2022-12-14 DIAGNOSIS — W19XXXA Unspecified fall, initial encounter: Secondary | ICD-10-CM | POA: Diagnosis not present

## 2022-12-14 DIAGNOSIS — S12391A Other nondisplaced fracture of fourth cervical vertebra, initial encounter for closed fracture: Secondary | ICD-10-CM

## 2022-12-14 DIAGNOSIS — R519 Headache, unspecified: Secondary | ICD-10-CM | POA: Diagnosis not present

## 2022-12-14 DIAGNOSIS — Z743 Need for continuous supervision: Secondary | ICD-10-CM | POA: Diagnosis not present

## 2022-12-14 DIAGNOSIS — R279 Unspecified lack of coordination: Secondary | ICD-10-CM | POA: Diagnosis not present

## 2022-12-14 DIAGNOSIS — S199XXA Unspecified injury of neck, initial encounter: Secondary | ICD-10-CM | POA: Diagnosis not present

## 2022-12-14 DIAGNOSIS — Z20822 Contact with and (suspected) exposure to covid-19: Secondary | ICD-10-CM | POA: Diagnosis not present

## 2022-12-14 DIAGNOSIS — I6782 Cerebral ischemia: Secondary | ICD-10-CM | POA: Diagnosis not present

## 2022-12-14 DIAGNOSIS — G4489 Other headache syndrome: Secondary | ICD-10-CM | POA: Diagnosis not present

## 2022-12-14 LAB — COMPREHENSIVE METABOLIC PANEL
ALT: 79 U/L — ABNORMAL HIGH (ref 0–44)
AST: 99 U/L — ABNORMAL HIGH (ref 15–41)
Albumin: 3.5 g/dL (ref 3.5–5.0)
Alkaline Phosphatase: 276 U/L — ABNORMAL HIGH (ref 38–126)
Anion gap: 16 — ABNORMAL HIGH (ref 5–15)
BUN: 28 mg/dL — ABNORMAL HIGH (ref 8–23)
CO2: 21 mmol/L — ABNORMAL LOW (ref 22–32)
Calcium: 8.8 mg/dL — ABNORMAL LOW (ref 8.9–10.3)
Chloride: 103 mmol/L (ref 98–111)
Creatinine, Ser: 1.42 mg/dL — ABNORMAL HIGH (ref 0.61–1.24)
GFR, Estimated: 54 mL/min — ABNORMAL LOW (ref >60)
Glucose, Bld: 90 mg/dL (ref 70–99)
Potassium: 3.4 mmol/L — ABNORMAL LOW (ref 3.5–5.1)
Sodium: 140 mmol/L (ref 135–145)
Total Bilirubin: 1.3 mg/dL — ABNORMAL HIGH (ref 0.3–1.2)
Total Protein: 7.8 g/dL (ref 6.5–8.1)

## 2022-12-14 LAB — CBC WITH DIFFERENTIAL/PLATELET
Abs Immature Granulocytes: 0.03 10*3/uL (ref 0.00–0.07)
Basophils Absolute: 0.1 10*3/uL (ref 0.0–0.1)
Basophils Relative: 1 %
Eosinophils Absolute: 0.1 10*3/uL (ref 0.0–0.5)
Eosinophils Relative: 1 %
HCT: 57.3 % — ABNORMAL HIGH (ref 39.0–52.0)
Hemoglobin: 18.5 g/dL — ABNORMAL HIGH (ref 13.0–17.0)
Immature Granulocytes: 0 %
Lymphocytes Relative: 9 %
Lymphs Abs: 0.7 10*3/uL (ref 0.7–4.0)
MCH: 28.4 pg (ref 26.0–34.0)
MCHC: 32.3 g/dL (ref 30.0–36.0)
MCV: 88 fL (ref 80.0–100.0)
Monocytes Absolute: 0.9 10*3/uL (ref 0.1–1.0)
Monocytes Relative: 11 %
Neutro Abs: 6.6 10*3/uL (ref 1.7–7.7)
Neutrophils Relative %: 78 %
Platelets: 214 10*3/uL (ref 150–400)
RBC: 6.51 MIL/uL — ABNORMAL HIGH (ref 4.22–5.81)
RDW: 17.3 % — ABNORMAL HIGH (ref 11.5–15.5)
WBC: 8.4 10*3/uL (ref 4.0–10.5)
nRBC: 0 % (ref 0.0–0.2)

## 2022-12-14 LAB — SARS CORONAVIRUS 2 BY RT PCR: SARS Coronavirus 2 by RT PCR: NEGATIVE

## 2022-12-14 NOTE — Discharge Instructions (Signed)
Hard collar at all times and follow-up with neurosurgery in 4 weeks

## 2022-12-14 NOTE — ED Triage Notes (Addendum)
Patient BIB [facility] from [facility]. Has had weakness for 1 week, no appetite. 3 days ago, began having a lean towards left side. Stated he feels that the whole left side is weaker than the right. Has a headache that began 1 week ago and still has one today. History of clots.

## 2022-12-14 NOTE — ED Notes (Signed)
This RN spoke to [employer], Charity fundraiser at [facility] and gave report on pt

## 2022-12-14 NOTE — ED Provider Notes (Signed)
[facility] EMERGENCY DEPARTMENT AT [facility] Provider Note   CSN: [id number] Arrival date & time: 12/14/22  1821     History Chief Complaint  Patient presents with   Weakness    HPI [name] is a 67 y.o. male presenting for nonspecific neck pain weakness. Per sister: Larey Seat 2 days ago. They denied any other symptoms  Patient's recorded medical, surgical, social, medication list and allergies were reviewed in the Snapshot window as part of the initial history.   Review of Systems   Review of Systems  Constitutional:  Negative for chills and fever.  HENT:  Negative for ear pain and sore throat.   Eyes:  Negative for pain and visual disturbance.  Respiratory:  Negative for cough and shortness of breath.   Cardiovascular:  Negative for chest pain and palpitations.  Gastrointestinal:  Negative for abdominal pain and vomiting.  Genitourinary:  Negative for dysuria and hematuria.  Musculoskeletal:  Positive for neck pain. Negative for arthralgias and back pain.  Skin:  Negative for color change and rash.  Neurological:  Negative for seizures and syncope.  All other systems reviewed and are negative.   Physical Exam Updated Vital Signs BP 131/86   Pulse 71   Temp 98.4 F (36.9 C) (Oral)   Resp 18   Ht 5\' 6"  (1.676 m)   Wt 90.7 kg   SpO2 96%   BMI 32.28 kg/m  Physical Exam Vitals and nursing note reviewed.  Constitutional:      General: He is not in acute distress.    Appearance: He is well-developed.  HENT:     Head: Normocephalic and atraumatic.  Eyes:     Conjunctiva/sclera: Conjunctivae normal.  Cardiovascular:     Rate and Rhythm: Normal rate and regular rhythm.     Heart sounds: No murmur heard. Pulmonary:     Effort: Pulmonary effort is normal. No respiratory distress.     Breath sounds: Normal breath sounds.  Abdominal:     Palpations: Abdomen is soft.     Tenderness: There is no abdominal tenderness.  Musculoskeletal:        General:  No swelling.     Cervical back: Neck supple.  Skin:    General: Skin is warm and dry.     Capillary Refill: Capillary refill takes less than 2 seconds.  Neurological:     Mental Status: He is alert.  Psychiatric:        Mood and Affect: Mood normal.      ED Course/ Medical Decision Making/ A&P Clinical Course as of 12/14/22 2219  Sat Dec 14, 2022  2154 DG Chest Portable 1 View [CC]    Clinical Course User Index [CC] Glyn Ade, MD    Procedures Procedures   Medications Ordered in ED Medications - No data to display Medical Decision Making:   [name] is a 67 y.o. male who presented to the ED today with a fall at their living facility detailed above. They are not on a blood thinner. Handoff received from EMS.  Patient placed on continuous vitals and telemetry monitoring while in ED which was reviewed periodically.   Complete initial physical exam performed, notably the patient  was hemodynamically stable  in no acute distress. No obvious deformities or injuries appreciated on extensive physical exam including active range of motion of all joints.     Reviewed and confirmed nursing documentation for past medical history, family history, social history.    Initial Assessment/Plan:  This is a patient presenting with a moderate blunt mechanism trauma.  As such, I have considered intracranial injuries including intracranial hemorrhage, intrathoracic injuries including blunt myocardial or blunt lung injury, blunt abdominal injuries including aortic dissection, bladder injury, spleen injury, liver injury and I have considered orthopedic injuries including extremity or spinal injury. This is most consistent with an acute life/limb threatening illness complicated by underlying chronic conditions.  With the patient's presentation of moderate mechanism trauma but an otherwise reassuring exam, patient warrants targeted evaluation for potential traumatic injuries. Will proceed  with targeted evaluation for potential injuries. Will proceed with CT Head, Cervical Spine CT, and Chest/Pelvis XR.   Images reviewed and agree with radiology interpretation.  CT CERVICAL SPINE WO CONTRAST  Result Date: 12/14/2022 CLINICAL DATA:  Poly trauma, blunt. Weakness for 1 week. Leaning towards the left side. Left side weaker than right. Headache. EXAM: CT CERVICAL SPINE WITHOUT CONTRAST TECHNIQUE: Multidetector CT imaging of the cervical spine was performed without intravenous contrast. Multiplanar CT image reconstructions were also generated. RADIATION DOSE REDUCTION: This exam was performed according to the departmental dose-optimization program which includes automated exposure control, adjustment of the mA and/or kV according to patient size and/or use of iterative reconstruction technique. COMPARISON:  02/17/2022 FINDINGS: Alignment: There is slight anterior subluxation of C 4 on C5, new since prior study. Skull base and vertebrae: Skull base appears intact. No vertebral compression deformities. Focal linear lucency adjacent to the anterior superior endplate of C5 possibly representing a small corner fracture or avulsion. No focal bone lesion or bone destruction. Soft tissues and spinal canal: No prevertebral fluid or swelling. No visible canal hematoma. Disc levels: Mild asymmetrical anterior widening of the C4-5 disc space. Mild degenerative changes throughout the cervical spine with small endplate osteophyte formation. Degenerative changes in the posterior facet joints. Upper chest: Lung apices are clear. Other: None. IMPRESSION: 1. There is slight anterior subluxation of C4 on C5 with mild asymmetrical anterior widening of the C4-5 disc space and suggestion of a small lucency at the anterior superior endplate. Changes are new since prior study and could represent acute injury. MRI is suggested for further evaluation. 2. Mild degenerative changes in the cervical spine. Electronically Signed    By: Burman Nieves M.D.   On: 12/14/2022 20:51   CT HEAD WO CONTRAST ( )  Result Date: 12/14/2022 CLINICAL DATA:  Mental status change of unknown cause. Weakness for 1 week without appetite. Leaning towards the left side for 3 days. Left-sided weakness. Headache. EXAM: CT HEAD WITHOUT CONTRAST TECHNIQUE: Contiguous axial images were obtained from the base of the skull through the vertex without intravenous contrast. RADIATION DOSE REDUCTION: This exam was performed according to the departmental dose-optimization program which includes automated exposure control, adjustment of the mA and/or kV according to patient size and/or use of iterative reconstruction technique. COMPARISON:  02/28/2022 FINDINGS: Brain: Diffuse cerebral atrophy. Ventricular dilatation consistent with central atrophy. Low-attenuation changes in the deep white matter consistent with small vessel ischemia. No abnormal extra-axial fluid collections. No mass effect or midline shift. Gray-white matter junctions are distinct. Basal cisterns are not effaced. No acute intracranial hemorrhage. Vascular: No hyperdense vessel or unexpected calcification. Skull: Normal. Negative for fracture or focal lesion. Sinuses/Orbits: No acute finding. Other: None. IMPRESSION: No acute intracranial abnormalities. Chronic atrophy and small vessel ischemic changes. Electronically Signed   By: Burman Nieves M.D.   On: 12/14/2022 20:46   DG Chest Portable 1 View  Result Date: 12/14/2022 CLINICAL DATA:  Weakness for  1 month EXAM: PORTABLE CHEST 1 VIEW COMPARISON:  02/17/22 FINDINGS: The heart size and mediastinal contours are within normal limits. Both lungs are clear. The visualized skeletal structures are unremarkable. IMPRESSION: No active disease. Electronically Signed   By: Alcide Clever M.D.   On: 12/14/2022 19:22    Final Reassessment and Plan:   Patient's objective findings reveal a C4 superior plate fracture.  Consulted Dr.Nundkumar of [facility].  Given the reported leg weakness and findings of a superior plate fracture.  He recommended hard collar at all times and follow-up in his clinic within 2 weeks. Given well appearance lack of any other focal deficits any other infectious or abnormal pathology on his evaluation today I believe patient stable for outpatient care and management.  Disposition:  I have considered need for hospitalization, however, considering all of the above, I believe this patient is stable for discharge at this time.  Patient/family educated about specific return precautions for given chief complaint and symptoms.  Patient/family educated about follow-up with PCP .     Patient/family expressed understanding of return precautions and need for follow-up. Patient spoken to regarding all imaging and laboratory results and appropriate follow up for these results. All education provided in verbal form with additional information in written form. Time was allowed for answering of patient questions. Patient discharged.    Emergency Department Medication Summary:   Medications - No data to display       Clinical Impression:  1. Other closed nondisplaced fracture of fourth cervical vertebra, initial encounter [facility])      Discharge   Final Clinical Impression(s) / ED Diagnoses Final diagnoses:  Other closed nondisplaced fracture of fourth cervical vertebra, initial encounter [facility])    Rx / DC Orders ED Discharge Orders     None         Glyn Ade, MD 12/14/22 2219

## 2022-12-14 NOTE — ED Notes (Signed)
Advised pt the need for a urine specimen, provided a urinal RN made aware, suggested a condom cath if pt is incontinent

## 2022-12-15 DIAGNOSIS — E1121 Type 2 diabetes mellitus with diabetic nephropathy: Secondary | ICD-10-CM | POA: Diagnosis not present

## 2022-12-15 DIAGNOSIS — M6281 Muscle weakness (generalized): Secondary | ICD-10-CM | POA: Diagnosis not present

## 2022-12-15 DIAGNOSIS — R279 Unspecified lack of coordination: Secondary | ICD-10-CM | POA: Diagnosis not present

## 2022-12-15 DIAGNOSIS — Z7401 Bed confinement status: Secondary | ICD-10-CM | POA: Diagnosis not present

## 2022-12-15 DIAGNOSIS — R531 Weakness: Secondary | ICD-10-CM | POA: Diagnosis not present

## 2022-12-15 NOTE — ED Notes (Signed)
Gave pt [place], egg & cheese biscuit with a apple juice

## 2022-12-16 DIAGNOSIS — E1121 Type 2 diabetes mellitus with diabetic nephropathy: Secondary | ICD-10-CM | POA: Diagnosis not present

## 2022-12-16 DIAGNOSIS — E119 Type 2 diabetes mellitus without complications: Secondary | ICD-10-CM | POA: Diagnosis not present

## 2022-12-16 DIAGNOSIS — R296 Repeated falls: Secondary | ICD-10-CM | POA: Diagnosis not present

## 2022-12-16 DIAGNOSIS — R279 Unspecified lack of coordination: Secondary | ICD-10-CM | POA: Diagnosis not present

## 2022-12-16 DIAGNOSIS — E559 Vitamin D deficiency, unspecified: Secondary | ICD-10-CM | POA: Diagnosis not present

## 2022-12-16 DIAGNOSIS — M6281 Muscle weakness (generalized): Secondary | ICD-10-CM | POA: Diagnosis not present

## 2022-12-17 DIAGNOSIS — R279 Unspecified lack of coordination: Secondary | ICD-10-CM | POA: Diagnosis not present

## 2022-12-17 DIAGNOSIS — E1121 Type 2 diabetes mellitus with diabetic nephropathy: Secondary | ICD-10-CM | POA: Diagnosis not present

## 2022-12-17 DIAGNOSIS — M6281 Muscle weakness (generalized): Secondary | ICD-10-CM | POA: Diagnosis not present

## 2022-12-18 DIAGNOSIS — R279 Unspecified lack of coordination: Secondary | ICD-10-CM | POA: Diagnosis not present

## 2022-12-18 DIAGNOSIS — M6281 Muscle weakness (generalized): Secondary | ICD-10-CM | POA: Diagnosis not present

## 2022-12-18 DIAGNOSIS — R296 Repeated falls: Secondary | ICD-10-CM | POA: Diagnosis not present

## 2022-12-18 DIAGNOSIS — E1121 Type 2 diabetes mellitus with diabetic nephropathy: Secondary | ICD-10-CM | POA: Diagnosis not present

## 2022-12-18 DIAGNOSIS — R5381 Other malaise: Secondary | ICD-10-CM | POA: Diagnosis not present

## 2022-12-18 DIAGNOSIS — M542 Cervicalgia: Secondary | ICD-10-CM | POA: Diagnosis not present

## 2022-12-18 DIAGNOSIS — R42 Dizziness and giddiness: Secondary | ICD-10-CM | POA: Diagnosis not present

## 2022-12-19 DIAGNOSIS — M6281 Muscle weakness (generalized): Secondary | ICD-10-CM | POA: Diagnosis not present

## 2022-12-19 DIAGNOSIS — E1121 Type 2 diabetes mellitus with diabetic nephropathy: Secondary | ICD-10-CM | POA: Diagnosis not present

## 2022-12-19 DIAGNOSIS — R296 Repeated falls: Secondary | ICD-10-CM | POA: Diagnosis not present

## 2022-12-19 DIAGNOSIS — R279 Unspecified lack of coordination: Secondary | ICD-10-CM | POA: Diagnosis not present

## 2022-12-19 DIAGNOSIS — R5381 Other malaise: Secondary | ICD-10-CM | POA: Diagnosis not present

## 2022-12-20 DIAGNOSIS — E1121 Type 2 diabetes mellitus with diabetic nephropathy: Secondary | ICD-10-CM | POA: Diagnosis not present

## 2022-12-20 DIAGNOSIS — E7800 Pure hypercholesterolemia, unspecified: Secondary | ICD-10-CM | POA: Diagnosis not present

## 2022-12-20 DIAGNOSIS — R279 Unspecified lack of coordination: Secondary | ICD-10-CM | POA: Diagnosis not present

## 2022-12-20 DIAGNOSIS — E119 Type 2 diabetes mellitus without complications: Secondary | ICD-10-CM | POA: Diagnosis not present

## 2022-12-20 DIAGNOSIS — I509 Heart failure, unspecified: Secondary | ICD-10-CM | POA: Diagnosis not present

## 2022-12-20 DIAGNOSIS — D649 Anemia, unspecified: Secondary | ICD-10-CM | POA: Diagnosis not present

## 2022-12-20 DIAGNOSIS — M6281 Muscle weakness (generalized): Secondary | ICD-10-CM | POA: Diagnosis not present

## 2022-12-20 DIAGNOSIS — E559 Vitamin D deficiency, unspecified: Secondary | ICD-10-CM | POA: Diagnosis not present

## 2022-12-20 DIAGNOSIS — I4891 Unspecified atrial fibrillation: Secondary | ICD-10-CM | POA: Diagnosis not present

## 2022-12-23 DIAGNOSIS — R279 Unspecified lack of coordination: Secondary | ICD-10-CM | POA: Diagnosis not present

## 2022-12-23 DIAGNOSIS — M6281 Muscle weakness (generalized): Secondary | ICD-10-CM | POA: Diagnosis not present

## 2022-12-23 DIAGNOSIS — E1121 Type 2 diabetes mellitus with diabetic nephropathy: Secondary | ICD-10-CM | POA: Diagnosis not present

## 2022-12-24 DIAGNOSIS — M6281 Muscle weakness (generalized): Secondary | ICD-10-CM | POA: Diagnosis not present

## 2022-12-24 DIAGNOSIS — R279 Unspecified lack of coordination: Secondary | ICD-10-CM | POA: Diagnosis not present

## 2022-12-24 DIAGNOSIS — E1121 Type 2 diabetes mellitus with diabetic nephropathy: Secondary | ICD-10-CM | POA: Diagnosis not present

## 2022-12-25 DIAGNOSIS — E1121 Type 2 diabetes mellitus with diabetic nephropathy: Secondary | ICD-10-CM | POA: Diagnosis not present

## 2022-12-25 DIAGNOSIS — M6281 Muscle weakness (generalized): Secondary | ICD-10-CM | POA: Diagnosis not present

## 2022-12-25 DIAGNOSIS — R258 Other abnormal involuntary movements: Secondary | ICD-10-CM | POA: Diagnosis not present

## 2022-12-25 DIAGNOSIS — R279 Unspecified lack of coordination: Secondary | ICD-10-CM | POA: Diagnosis not present

## 2022-12-25 DIAGNOSIS — M47812 Spondylosis without myelopathy or radiculopathy, cervical region: Secondary | ICD-10-CM | POA: Diagnosis not present

## 2022-12-26 DIAGNOSIS — R279 Unspecified lack of coordination: Secondary | ICD-10-CM | POA: Diagnosis not present

## 2022-12-26 DIAGNOSIS — E1121 Type 2 diabetes mellitus with diabetic nephropathy: Secondary | ICD-10-CM | POA: Diagnosis not present

## 2022-12-26 DIAGNOSIS — M6281 Muscle weakness (generalized): Secondary | ICD-10-CM | POA: Diagnosis not present

## 2022-12-27 ENCOUNTER — Inpatient Hospital Stay (HOSPITAL_COMMUNITY): Payer: 59

## 2022-12-27 ENCOUNTER — Other Ambulatory Visit: Payer: Self-pay

## 2022-12-27 ENCOUNTER — Emergency Department (HOSPITAL_COMMUNITY): Payer: 59

## 2022-12-27 ENCOUNTER — Inpatient Hospital Stay (HOSPITAL_COMMUNITY)
Admission: EM | Admit: 2022-12-27 | Discharge: 2023-01-02 | DRG: 871 | Disposition: A | Discharge status: Skilled Nursing Facility | Payer: 59 | Source: Skilled Nursing Facility | Attending: Student | Admitting: Student

## 2022-12-27 ENCOUNTER — Encounter (HOSPITAL_COMMUNITY): Payer: Self-pay

## 2022-12-27 DIAGNOSIS — I2510 Atherosclerotic heart disease of native coronary artery without angina pectoris: Secondary | ICD-10-CM | POA: Diagnosis present

## 2022-12-27 DIAGNOSIS — E785 Hyperlipidemia, unspecified: Secondary | ICD-10-CM | POA: Diagnosis present

## 2022-12-27 DIAGNOSIS — E669 Obesity, unspecified: Secondary | ICD-10-CM | POA: Diagnosis present

## 2022-12-27 DIAGNOSIS — Z794 Long term (current) use of insulin: Secondary | ICD-10-CM | POA: Diagnosis not present

## 2022-12-27 DIAGNOSIS — G9341 Metabolic encephalopathy: Secondary | ICD-10-CM | POA: Diagnosis present

## 2022-12-27 DIAGNOSIS — E1165 Type 2 diabetes mellitus with hyperglycemia: Secondary | ICD-10-CM | POA: Diagnosis not present

## 2022-12-27 DIAGNOSIS — M109 Gout, unspecified: Secondary | ICD-10-CM | POA: Diagnosis present

## 2022-12-27 DIAGNOSIS — Z7984 Long term (current) use of oral hypoglycemic drugs: Secondary | ICD-10-CM

## 2022-12-27 DIAGNOSIS — Z1152 Encounter for screening for COVID-19: Secondary | ICD-10-CM | POA: Diagnosis not present

## 2022-12-27 DIAGNOSIS — R6520 Severe sepsis without septic shock: Secondary | ICD-10-CM | POA: Diagnosis present

## 2022-12-27 DIAGNOSIS — N136 Pyonephrosis: Secondary | ICD-10-CM | POA: Diagnosis present

## 2022-12-27 DIAGNOSIS — Z955 Presence of coronary angioplasty implant and graft: Secondary | ICD-10-CM

## 2022-12-27 DIAGNOSIS — N1330 Unspecified hydronephrosis: Secondary | ICD-10-CM | POA: Diagnosis not present

## 2022-12-27 DIAGNOSIS — J9811 Atelectasis: Secondary | ICD-10-CM | POA: Diagnosis not present

## 2022-12-27 DIAGNOSIS — N1831 Chronic kidney disease, stage 3a: Secondary | ICD-10-CM | POA: Diagnosis present

## 2022-12-27 DIAGNOSIS — Z7902 Long term (current) use of antithrombotics/antiplatelets: Secondary | ICD-10-CM

## 2022-12-27 DIAGNOSIS — E872 Acidosis, unspecified: Secondary | ICD-10-CM | POA: Diagnosis present

## 2022-12-27 DIAGNOSIS — N401 Enlarged prostate with lower urinary tract symptoms: Secondary | ICD-10-CM | POA: Diagnosis present

## 2022-12-27 DIAGNOSIS — Z8249 Family history of ischemic heart disease and other diseases of the circulatory system: Secondary | ICD-10-CM

## 2022-12-27 DIAGNOSIS — R1310 Dysphagia, unspecified: Secondary | ICD-10-CM | POA: Diagnosis not present

## 2022-12-27 DIAGNOSIS — Z7401 Bed confinement status: Secondary | ICD-10-CM | POA: Diagnosis not present

## 2022-12-27 DIAGNOSIS — N179 Acute kidney failure, unspecified: Secondary | ICD-10-CM | POA: Diagnosis not present

## 2022-12-27 DIAGNOSIS — F0393 Unspecified dementia, unspecified severity, with mood disturbance: Secondary | ICD-10-CM | POA: Diagnosis present

## 2022-12-27 DIAGNOSIS — A4102 Sepsis due to Methicillin resistant Staphylococcus aureus: Secondary | ICD-10-CM | POA: Diagnosis present

## 2022-12-27 DIAGNOSIS — R000 Tachycardia, unspecified: Secondary | ICD-10-CM | POA: Diagnosis not present

## 2022-12-27 DIAGNOSIS — R531 Weakness: Secondary | ICD-10-CM | POA: Diagnosis not present

## 2022-12-27 DIAGNOSIS — Z6832 Body mass index (BMI) 32.0-32.9, adult: Secondary | ICD-10-CM | POA: Diagnosis not present

## 2022-12-27 DIAGNOSIS — A419 Sepsis, unspecified organism: Secondary | ICD-10-CM | POA: Diagnosis present

## 2022-12-27 DIAGNOSIS — I252 Old myocardial infarction: Secondary | ICD-10-CM

## 2022-12-27 DIAGNOSIS — E1121 Type 2 diabetes mellitus with diabetic nephropathy: Secondary | ICD-10-CM

## 2022-12-27 DIAGNOSIS — Z800 Family history of malignant neoplasm of digestive organs: Secondary | ICD-10-CM

## 2022-12-27 DIAGNOSIS — E876 Hypokalemia: Secondary | ICD-10-CM | POA: Diagnosis not present

## 2022-12-27 DIAGNOSIS — R7881 Bacteremia: Secondary | ICD-10-CM | POA: Diagnosis not present

## 2022-12-27 DIAGNOSIS — R7989 Other specified abnormal findings of blood chemistry: Secondary | ICD-10-CM | POA: Diagnosis not present

## 2022-12-27 DIAGNOSIS — R4189 Other symptoms and signs involving cognitive functions and awareness: Secondary | ICD-10-CM | POA: Diagnosis present

## 2022-12-27 DIAGNOSIS — Z7982 Long term (current) use of aspirin: Secondary | ICD-10-CM

## 2022-12-27 DIAGNOSIS — E860 Dehydration: Secondary | ICD-10-CM | POA: Diagnosis present

## 2022-12-27 DIAGNOSIS — Z0389 Encounter for observation for other suspected diseases and conditions ruled out: Secondary | ICD-10-CM | POA: Diagnosis not present

## 2022-12-27 DIAGNOSIS — I129 Hypertensive chronic kidney disease with stage 1 through stage 4 chronic kidney disease, or unspecified chronic kidney disease: Secondary | ICD-10-CM | POA: Diagnosis present

## 2022-12-27 DIAGNOSIS — K8020 Calculus of gallbladder without cholecystitis without obstruction: Secondary | ICD-10-CM | POA: Diagnosis not present

## 2022-12-27 DIAGNOSIS — E1122 Type 2 diabetes mellitus with diabetic chronic kidney disease: Secondary | ICD-10-CM | POA: Diagnosis present

## 2022-12-27 DIAGNOSIS — D6959 Other secondary thrombocytopenia: Secondary | ICD-10-CM | POA: Diagnosis present

## 2022-12-27 DIAGNOSIS — Z888 Allergy status to other drugs, medicaments and biological substances status: Secondary | ICD-10-CM

## 2022-12-27 DIAGNOSIS — I499 Cardiac arrhythmia, unspecified: Secondary | ICD-10-CM | POA: Diagnosis not present

## 2022-12-27 DIAGNOSIS — Z79899 Other long term (current) drug therapy: Secondary | ICD-10-CM

## 2022-12-27 DIAGNOSIS — R0989 Other specified symptoms and signs involving the circulatory and respiratory systems: Secondary | ICD-10-CM | POA: Diagnosis not present

## 2022-12-27 DIAGNOSIS — R0902 Hypoxemia: Secondary | ICD-10-CM | POA: Diagnosis not present

## 2022-12-27 DIAGNOSIS — R059 Cough, unspecified: Secondary | ICD-10-CM | POA: Diagnosis not present

## 2022-12-27 DIAGNOSIS — K838 Other specified diseases of biliary tract: Secondary | ICD-10-CM | POA: Diagnosis not present

## 2022-12-27 DIAGNOSIS — Z801 Family history of malignant neoplasm of trachea, bronchus and lung: Secondary | ICD-10-CM

## 2022-12-27 DIAGNOSIS — Z808 Family history of malignant neoplasm of other organs or systems: Secondary | ICD-10-CM

## 2022-12-27 DIAGNOSIS — R001 Bradycardia, unspecified: Secondary | ICD-10-CM | POA: Diagnosis not present

## 2022-12-27 DIAGNOSIS — M6281 Muscle weakness (generalized): Secondary | ICD-10-CM | POA: Diagnosis not present

## 2022-12-27 DIAGNOSIS — Z833 Family history of diabetes mellitus: Secondary | ICD-10-CM

## 2022-12-27 DIAGNOSIS — R109 Unspecified abdominal pain: Secondary | ICD-10-CM | POA: Diagnosis not present

## 2022-12-27 DIAGNOSIS — R279 Unspecified lack of coordination: Secondary | ICD-10-CM | POA: Diagnosis not present

## 2022-12-27 DIAGNOSIS — R338 Other retention of urine: Secondary | ICD-10-CM | POA: Diagnosis not present

## 2022-12-27 DIAGNOSIS — Z743 Need for continuous supervision: Secondary | ICD-10-CM | POA: Diagnosis not present

## 2022-12-27 LAB — I-STAT CG4 LACTIC ACID, ED
Lactic Acid, Venous: 2.3 mmol/L (ref 0.5–1.9)
Lactic Acid, Venous: >15 mmol/L (ref 0.5–1.9)

## 2022-12-27 LAB — CBC WITH DIFFERENTIAL/PLATELET
Abs Immature Granulocytes: 0.08 10*3/uL — ABNORMAL HIGH (ref 0.00–0.07)
Basophils Absolute: 0.1 10*3/uL (ref 0.0–0.1)
Basophils Relative: 0 %
Eosinophils Absolute: 0 10*3/uL (ref 0.0–0.5)
Eosinophils Relative: 0 %
HCT: 55.8 % — ABNORMAL HIGH (ref 39.0–52.0)
Hemoglobin: 18.5 g/dL — ABNORMAL HIGH (ref 13.0–17.0)
Immature Granulocytes: 0 %
Lymphocytes Relative: 4 %
Lymphs Abs: 0.7 10*3/uL (ref 0.7–4.0)
MCH: 28 pg (ref 26.0–34.0)
MCHC: 33.2 g/dL (ref 30.0–36.0)
MCV: 84.5 fL (ref 80.0–100.0)
Monocytes Absolute: 1.4 10*3/uL — ABNORMAL HIGH (ref 0.1–1.0)
Monocytes Relative: 7 %
Neutro Abs: 17 10*3/uL — ABNORMAL HIGH (ref 1.7–7.7)
Neutrophils Relative %: 89 %
Platelets: 237 10*3/uL (ref 150–400)
RBC: 6.6 MIL/uL — ABNORMAL HIGH (ref 4.22–5.81)
RDW: 18.2 % — ABNORMAL HIGH (ref 11.5–15.5)
WBC: 19.3 10*3/uL — ABNORMAL HIGH (ref 4.0–10.5)
nRBC: 0 % (ref 0.0–0.2)

## 2022-12-27 LAB — COMPREHENSIVE METABOLIC PANEL
ALT: 102 U/L — ABNORMAL HIGH (ref 0–44)
AST: 108 U/L — ABNORMAL HIGH (ref 15–41)
Albumin: 2.6 g/dL — ABNORMAL LOW (ref 3.5–5.0)
Alkaline Phosphatase: 290 U/L — ABNORMAL HIGH (ref 38–126)
Anion gap: 19 — ABNORMAL HIGH (ref 5–15)
BUN: 34 mg/dL — ABNORMAL HIGH (ref 8–23)
CO2: 18 mmol/L — ABNORMAL LOW (ref 22–32)
Calcium: 8.2 mg/dL — ABNORMAL LOW (ref 8.9–10.3)
Chloride: 104 mmol/L (ref 98–111)
Creatinine, Ser: 1.5 mg/dL — ABNORMAL HIGH (ref 0.61–1.24)
GFR, Estimated: 50 mL/min — ABNORMAL LOW (ref >60)
Glucose, Bld: 117 mg/dL — ABNORMAL HIGH (ref 70–99)
Potassium: 4.2 mmol/L (ref 3.5–5.1)
Sodium: 141 mmol/L (ref 135–145)
Total Bilirubin: 2.1 mg/dL — ABNORMAL HIGH (ref 0.3–1.2)
Total Protein: 6.4 g/dL — ABNORMAL LOW (ref 6.5–8.1)

## 2022-12-27 LAB — PROTIME-INR
INR: 9.4 (ref 0.8–1.2)
Prothrombin Time: 75.9 s — ABNORMAL HIGH (ref 11.4–15.2)

## 2022-12-27 LAB — RESP PANEL BY RT-PCR (RSV, FLU A&B, COVID)  RVPGX2
Influenza A by PCR: NEGATIVE
Influenza B by PCR: NEGATIVE
Resp Syncytial Virus by PCR: NEGATIVE
SARS Coronavirus 2 by RT PCR: NEGATIVE

## 2022-12-27 LAB — APTT: aPTT: 105 s — ABNORMAL HIGH (ref 24–36)

## 2022-12-27 MED ORDER — LACTATED RINGERS IV BOLUS
30.0000 mL/kg | Freq: Once | INTRAVENOUS | Status: AC
  Administered 2022-12-27: 2238 mL via INTRAVENOUS

## 2022-12-27 MED ORDER — LACTATED RINGERS IV SOLN: INTRAVENOUS | Status: DC

## 2022-12-27 MED ORDER — POLYETHYLENE GLYCOL 3350 17 G PO PACK: 17.0000 g | PACK | Freq: Every day | ORAL | Status: DC | PRN

## 2022-12-27 MED ORDER — METRONIDAZOLE 500 MG/100ML IV SOLN
500.0000 mg | Freq: Once | INTRAVENOUS | Status: AC
  Administered 2022-12-27: 500 mg via INTRAVENOUS
  Filled 2022-12-27: qty 100

## 2022-12-27 MED ORDER — DOCUSATE SODIUM 100 MG PO CAPS
100.0000 mg | ORAL_CAPSULE | Freq: Two times a day (BID) | ORAL | Status: DC
  Administered 2022-12-29 – 2022-12-30 (×2): 100 mg via ORAL
  Filled 2022-12-27 (×5): qty 1

## 2022-12-27 MED ORDER — VANCOMYCIN HCL IN DEXTROSE 1-5 GM/200ML-% IV SOLN: 1000.0000 mg | Freq: Once | INTRAVENOUS | Status: DC

## 2022-12-27 MED ORDER — IOHEXOL 350 MG/ML SOLN
75.0000 mL | Freq: Once | INTRAVENOUS | Status: AC | PRN
  Administered 2022-12-27: 75 mL via INTRAVENOUS

## 2022-12-27 MED ORDER — METRONIDAZOLE 500 MG/100ML IV SOLN
500.0000 mg | Freq: Two times a day (BID) | INTRAVENOUS | Status: DC
  Administered 2022-12-28 – 2022-12-29 (×3): 500 mg via INTRAVENOUS
  Filled 2022-12-27 (×3): qty 100

## 2022-12-27 MED ORDER — SODIUM CHLORIDE 0.9 % IV SOLN
2.0000 g | Freq: Once | INTRAVENOUS | Status: AC
  Administered 2022-12-27: 2 g via INTRAVENOUS
  Filled 2022-12-27: qty 12.5

## 2022-12-27 MED ORDER — VANCOMYCIN HCL 2000 MG/400ML IV SOLN
2000.0000 mg | Freq: Once | INTRAVENOUS | Status: AC
  Administered 2022-12-27: 2000 mg via INTRAVENOUS
  Filled 2022-12-27: qty 400

## 2022-12-27 NOTE — Progress Notes (Signed)
Pt being followed by [facility] for Sepsis protocol. 

## 2022-12-27 NOTE — ED Notes (Signed)
Called lab to get another adjusted blue tube sent down for recollect

## 2022-12-27 NOTE — ED Triage Notes (Signed)
"  From [facility], they said he vomited some brownish/bloody emesis today. He is usually very talkative and alert but he has been very weak and lethargic today and just not himself" per EMS

## 2022-12-27 NOTE — Progress Notes (Signed)
ED Pharmacy Antibiotic Sign Off An antibiotic consult was received from an ED provider for vancomycin and cefepime per pharmacy dosing for sepsis of unknown origin. A chart review was completed to assess appropriateness.   The following one time order(s) were placed:  Vancomycin 2000mg  x1  Cefepime 2g   Further antibiotic and/or antibiotic pharmacy consults should be ordered by the admitting provider if indicated.   Thank you for allowing pharmacy to be a part of this patient's care.   Estill Batten, PharmD, BCCCP  Clinical Pharmacist 12/27/22 6:45 PM

## 2022-12-27 NOTE — Progress Notes (Signed)
Pharmacy Antibiotic Note  [name] is a 67 y.o. male admitted on 12/27/2022 with sepsis of unknown source. Pharmacy has been consulted for vancomycin and cefepime dosing.  Plan: Cefepime 2g q8h Vancomycin 1500mg  q24h (eAUC 491, Scr 1.25) F/u renal function, micro data and narrow as able  Vancomycin levels as needed  Height: 5\' 6"  (167.6 cm) Weight: 90.7 kg (200 lb) IBW/kg (Calculated) : 63.8  Temp (24hrs), Avg:99 F (37.2 C), Min:97.8 F (36.6 C), Max:100.2 F (37.9 C)  Recent Labs  Lab 12/27/22 1930 12/27/22 1941 12/27/22 2216  WBC 19.3*  --   --   CREATININE 1.50*  --   --   LATICACIDVEN  --  2.3* >15.0*    Estimated Creatinine Clearance: 49.7 mL/min (A) (by C-G formula based on SCr of 1.5 mg/dL (H)).    Allergies  Allergen Reactions   Haldol [Haloperidol] Other (See Comments)    Per family cause life threatening complications to patient Per Pt, it causes "complications with my mouth"    Antimicrobials this admission: Vancomycin 10/11 > Cefepime 10/11 >  Flagyl 10/11 >  Microbiology results: 10//11 BCx: pending 10/11 resp panel: negative  Thank you for allowing pharmacy to be a part of this patient's care.  Marja Kays 12/27/2022 11:21 PM

## 2022-12-27 NOTE — ED Notes (Signed)
Patient transported to CT 

## 2022-12-27 NOTE — H&P (Addendum)
HISTORY AND PHYSICAL    [name]   BTD:[id number] DOB: Nov 04, 1955   Date of Service: 12/27/22 Requesting physician/APP from ED: Treatment Team:  Attending Provider: Sunnie Nielsen, DO  PCP: Pcp, No   HPI: [name] is a 67 y.o. male w/ PMH HTN, HLD, DM2 insulin-dependent.   Presents from [facility] facility with chief complaint vomiting some brownish/bloody emesis, altered mental status stating usually very talkative/alert but has been weak and lethargic throughout today.   Patient is unable to meaningfully contribute to HPI/ROS, information obtained from record review and ED provider.  In ED, blood pressure stable at 138/99, tachycardic heart rate initially 127 down to 113, respiratory rate initially 18 but occasionally elevated to 27.  WBC elevated at 19.3.  Hgb somewhat elevated 18.5.  Renal function close to baseline creatinine 1.5 and GFR 50.  Elevated liver enzymes, ALP 290, AST 108, ALT 102, all elevated from recent measurement 12/14/2022.  Respiratory PCR negative including COVID/flu.  CXR appears normal.  CT abdomen pelvis showing severe chronic right hydronephrosis, bladder thickening, cholelithiasis, hepatic steatosis, coronary artery disease and aortic atherosclerosis, questionable groundglass findings LLL question alveolitis/early pneumonia.  Patient was dosed with cefepime and vancomycin.  Urinalysis was ordered but had not been collected.  Initial lactic acid measured at 2.3, repeat measurement showed greater than 15, question lab error.  INR elevated at 9.4.  Blood cultures collected.     Consultants:  none  Procedures: none      ASSESSMENT & PLAN:   Severe sepsis, POA, uncertain source, questionable septic shock with significant elevation lactic acid greater than 15 on repeat measurement, suspect potential lab error as this trended up significantly despite receiving IV fluids and having started on ABX and then repeat level was 5 UTI seems likely  given imaging findings and mental status change  No headache reported, meningitis is in the differential but seems unlikely  Broad-spectrum antibiotics, continue vancomycin and cefepime per pharmacy dosing, added metronidazole Awaiting UA and will make sure urine culture is ordered, of note patient will have received IV antibiotics prior to urinalysis collection Await blood cultures CT head pending Repeat lactic acid level ordered to confirm given substantial abnormality / upward trend  Trend CBC --> WBC improving  Trend lactic acid: 2.3 on presentation to ED --> >15 suspect lab error as repeat --> 5.0  Acute Delirium Ddx: Acute metabolic encephalopathy due to infection/sepsis unknown source, would also consider head trauma given hx falls, concern may need LP to evaluate for meningitis, possible hepatic encephalopathy, consider CVA but seems less likely with no apparent focal deficits and strong evidence for sepsis. TSH WNL.  CT head pending  Treating sepsis as above and investigating for source  Await ammonia levels  NPO pending swallow evaluation, may need SLP consult  Holding psychoactive medications    Elevated anion gap metabolic acidosis Likely d/t lactic acidosis Trend BMP and lactic --> anion gap improved but CO2 lower  Suspect respiratory compensation but RR now has also normalized, will go ahead and get ABG   Elevation in LFT, suspect hepatic dysfunction and questionable hepatic encephalopathy Hepatitis panel pending Ammonia level pending --> WNL Closely monitor CMP --> trending appropriately   Elevated INR, not on warfarin Prolonged PT and aPTT No evidence of bleeding at this time Platelet count normal - less likely for acute DIC  Question due to hepatic dysfunction, concern for DIC so will eval for this  Repeat levels ordered to confirm given substantial abnormality  Closely  monitor CMP Closely monitor CBC, obtain peripheral smear review and fibrinogen Holding  pharmacologic DVT PPx  CKD3a Slight elevation in Cr to 1.5 from baseline 1.3 not meeting criteria for AKI Monitor BMP --> Cr some improved on fluids   DM2 SSI q4h while NPO Holding home meds  HTN Holding home meds d/t risk hypotension w/ severe sepsis      DVT prophylaxis: SCD until can confirm INR Pertinent IV fluids/nutrition: continue LR -insetting of IV fluid shortage, DC/slow rate ASAP Central lines / invasive devices: none  Code Status: FULL CODE - MOST form reviewed Family Communication: none at this time  Disposition: inpatient TOC needs: TBD Barriers to discharge / significant pending items: clinical improvement, expect will be here several days                   Review of Systems:  Review of Systems  Respiratory:  Negative for shortness of breath.   Cardiovascular:  Negative for chest pain.  Note, ROS limited, patient is difficult to awaken and unable to carry on a coherent conversation, he can answer basic questions with yes/no.  He is not oriented       has a past medical history of Acute kidney injury superimposed on chronic kidney disease (HCC) (02/10/2020), Acute on chronic kidney failure (HCC) (09/28/2019), Closed nondisplaced spiral fracture of shaft of right tibia, Community acquired pneumonia (03/24/2021), Complicated urinary tract infection (09/06/2016), Depression, Diabetes mellitus without complication (HCC), GERD (gastroesophageal reflux disease), Gout, Hemorrhoids, Hypertension, Macular degeneration, Microcytic anemia, PNA (pneumonia) (03/26/2021), Prostate cancer (HCC) (08/2009), and Tubular adenoma. (Not in an outpatient encounter)   Allergies  Allergen Reactions   Haldol [Haloperidol] Other (See Comments)    Per family cause life threatening complications to patient Per Pt, it causes "complications with my mouth"   No current facility-administered medications on file prior to encounter.   Current Outpatient Medications on  File Prior to Encounter  Medication Sig Dispense Refill   amLODipine (NORVASC) 5 MG tablet Take 1 tablet (5 mg total) by mouth daily. 30 tablet 0   aspirin EC 81 MG tablet Take 1 tablet (81 mg total) by mouth daily. Swallow whole. 30 tablet 12   atorvastatin (LIPITOR) 80 MG tablet Take 1 tablet (80 mg total) by mouth daily. (Patient taking differently: Take 80 mg by mouth at bedtime.) 30 tablet 0   clopidogrel (PLAVIX) 75 MG tablet Take 75 mg by mouth daily.     Colchicine 0.6 MG CAPS Take 0.6-1.2 mg by mouth See admin instructions. Give 2 capsules (1.2 mg) every 24 hours as needed for gout. If pain has not subsided with 2 capsules, may administer an additional 1 capsule (0.6 mg) one hour after receiving the first 2 capsules.     dapagliflozin propanediol (FARXIGA) 10 MG TABS tablet Take 1 tablet (10 mg total) by mouth daily. 30 tablet 0   divalproex (DEPAKOTE ER) 250 MG 24 hr tablet Take 250 mg by mouth 2 (two) times daily.     finasteride (PROSCAR) 5 MG tablet Take 1 tablet (5 mg total) by mouth daily. 30 tablet 0   HYDROcodone-acetaminophen (NORCO/VICODIN) 5-325 MG tablet Take 1-2 tablets by mouth every 6 (six) hours as needed for moderate pain or severe pain. (Patient taking differently: Take 1-2 tablets by mouth See admin instructions. Give 1 tablet (moderate pain) or 2 tablets (severe pain) every 6 hours as needed.) 25 tablet 0   insulin lispro (HUMALOG) 100 UNIT/ML KwikPen Inject 4 Units into the skin  3 (three) times daily before meals.     LANTUS SOLOSTAR 100 UNIT/ML Solostar Pen Inject 20 Units into the skin daily.     LORazepam (ATIVAN) 0.5 MG tablet Take 1 tablet (0.5 mg total) by mouth every 8 (eight) hours as needed for anxiety. (Patient taking differently: Take 0.25 mg by mouth 2 (two) times daily.) 20 tablet 0   memantine (NAMENDA) 10 MG tablet Take 10 mg by mouth at bedtime.     metoprolol succinate (TOPROL-XL) 25 MG 24 hr tablet Take 1 tablet (25 mg total) by mouth daily. 30 tablet 0    nystatin (MYCOSTATIN/NYSTOP) powder Apply 1 Application topically 3 (three) times daily. To groin     OLANZapine (ZYPREXA) 2.5 MG tablet Take 2.5 mg by mouth 2 (two) times daily.     sertraline (ZOLOFT) 50 MG tablet Take 1 tablet (50 mg total) by mouth daily. (Patient taking differently: Take 75 mg by mouth daily.)     Zinc Oxide (DESITIN EX) Apply 1 application  topically as needed (skin irritation to bilateral inner thighs and groin area).        family history includes Diabetes in an other family member; Heart disease in his father; Lung cancer in his maternal uncle and mother; Pancreatic cancer in his father; Throat cancer in his brother.  Past Surgical History:  Procedure Laterality Date   CORONARY PRESSURE/FFR STUDY N/A 11/07/2021   Procedure: INTRAVASCULAR PRESSURE WIRE/FFR STUDY;  Surgeon: Orbie Pyo, MD;  Location: [facility] INVASIVE CV LAB;  Service: Cardiovascular;  Laterality: N/A;   CORONARY STENT INTERVENTION N/A 10/29/2021   Procedure: CORONARY STENT INTERVENTION;  Surgeon: [place], Peter M, MD;  Location: [facility] INVASIVE CV LAB;  Service: Cardiovascular;  Laterality: N/A;  Mid LAD   CYSTOSCOPY WITH RETROGRADE PYELOGRAM, URETEROSCOPY AND STENT PLACEMENT Right 07/31/2016   Procedure: CYSTOSCOPY WITH RIGHT  RETROGRADE PYELOGRAM, URETEROSCOPY;  Surgeon: Crist Fat, MD;  Location: [facility] ORS;  Service: Urology;  Laterality: Right;   INGUINAL HERNIA REPAIR     Left ankle joint fusion  1981   LEFT HEART CATH AND CORONARY ANGIOGRAPHY N/A 10/29/2021   Procedure: LEFT HEART CATH AND CORONARY ANGIOGRAPHY;  Surgeon: [place], Peter M, MD;  Location: [facility] INVASIVE CV LAB;  Service: Cardiovascular;  Laterality: N/A;   LEFT HEART CATH AND CORONARY ANGIOGRAPHY N/A 11/07/2021   Procedure: LEFT HEART CATH AND CORONARY ANGIOGRAPHY;  Surgeon: Orbie Pyo, MD;  Location: [facility] INVASIVE CV LAB;  Service: Cardiovascular;  Laterality: N/A;   PROSTATE BIOPSY     x 2   URETERAL REIMPLANTION  07/31/2016    Procedure: URETERAL REIMPLANT, right boari flap, right psoas hitch;  Surgeon: Crist Fat, MD;  Location: [facility] OR[facility];  Service: Urology;;          Objective Findings:  Vitals:   12/27/22 1945 12/27/22 2030 12/27/22 2201 12/27/22 2223  BP: 114/89 135/87 114/88   Pulse: (!) 124 (!) 113 (!) 106   Resp: (!) 27 (!) 25 18   Temp:    97.8 F (36.6 C)  TempSrc:    Axillary  SpO2: 95% 98% 96%   Weight:      Height:        Intake/Output Summary (Last 24 hours) at 12/27/2022 2306 Last data filed at 12/27/2022 2156 Gross per 24 hour  Intake 200 ml  Output --  Net 200 ml   Filed Weights   12/27/22 1839  Weight: 90.7 kg    Examination:  Physical Exam Constitutional:  General: He is not in acute distress.    Appearance: He is ill-appearing.  Cardiovascular:     Rate and Rhythm: Normal rate and regular rhythm.  Pulmonary:     Breath sounds: Normal breath sounds.     Comments: following commands to "take deep breaths" but poor respiratory effort  Abdominal:     General: Bowel sounds are normal. There is no distension.     Palpations: Abdomen is soft.     Tenderness: There is no abdominal tenderness.  Musculoskeletal:     Cervical back: Neck supple.     Right lower leg: No edema.     Left lower leg: No edema.  Skin:    General: Skin is warm and dry.  Neurological:     Mental Status: He is disoriented.     Comments: No apparent focal weakness but exam is limited d/t confusion. He is following commands (squeeze hands -equal grip strength, wiggle toes bilateral ROM intact), he is not oriented stating year is 2023 president is Felix Pacini cannot tell [place] where he is or situation  Psychiatric:        Behavior: Behavior normal.          Scheduled Medications:   docusate sodium  100 mg Oral BID    Continuous Infusions:  lactated ringers     [START ON 12/28/2022] metronidazole      PRN Medications:  polyethylene glycol  Antimicrobials:  Anti-infectives  (From admission, onward)    Start     Dose/Rate Route Frequency Ordered Stop   12/28/22 1000  metroNIDAZOLE (FLAGYL) IVPB 500 mg        500 mg 100 mL/hr over 60 Minutes Intravenous Every 12 hours 12/27/22 2257 01/04/23 0959   12/27/22 1845  ceFEPIme (MAXIPIME) 2 g in sodium chloride 0.9 % 100 mL IVPB        2 g 200 mL/hr over 30 Minutes Intravenous  Once 12/27/22 1841 12/27/22 2042   12/27/22 1845  metroNIDAZOLE (FLAGYL) IVPB 500 mg        500 mg 100 mL/hr over 60 Minutes Intravenous  Once 12/27/22 1841 12/27/22 2156   12/27/22 1845  vancomycin (VANCOCIN) IVPB 1000 mg/200 mL premix  Status:  Discontinued        1,000 mg 200 mL/hr over 60 Minutes Intravenous  Once 12/27/22 1841 12/27/22 1844   12/27/22 1845  vancomycin (VANCOREADY) IVPB 2000 mg/400 mL        2,000 mg 200 mL/hr over 120 Minutes Intravenous  Once 12/27/22 1844 12/27/22 2155           Data Reviewed: I have personally reviewed following labs and imaging studies  CBC: Recent Labs  Lab 12/27/22 1930  WBC 19.3*  NEUTROABS 17.0*  HGB 18.5*  HCT 55.8*  MCV 84.5  PLT 237   Basic Metabolic Panel: Recent Labs  Lab 12/27/22 1930  NA 141  K 4.2  CL 104  CO2 18*  GLUCOSE 117*  BUN 34*  CREATININE 1.50*  CALCIUM 8.2*   GFR: Estimated Creatinine Clearance: 49.7 mL/min (A) (by C-G formula based on SCr of 1.5 mg/dL (H)). Liver Function Tests: Recent Labs  Lab 12/27/22 1930  AST 108*  ALT 102*  ALKPHOS 290*  BILITOT 2.1*  PROT 6.4*  ALBUMIN 2.6*   No results for input(s): "LIPASE", "AMYLASE" in the last 168 hours. No results for input(s): "AMMONIA" in the last 168 hours. Coagulation Profile: Recent Labs  Lab 12/27/22 2153  INR 9.4*   Cardiac  Enzymes: No results for input(s): "CKTOTAL", "CKMB", "CKMBINDEX", "TROPONINI" in the last 168 hours. BNP (last 3 results) No results for input(s): "PROBNP" in the last 8760 hours. HbA1C: No results for input(s): "HGBA1C" in the last 72 hours. CBG: No  results for input(s): "GLUCAP" in the last 168 hours. Lipid Profile: No results for input(s): "CHOL", "HDL", "LDLCALC", "TRIG", "CHOLHDL", "LDLDIRECT" in the last 72 hours. Thyroid Function Tests: No results for input(s): "TSH", "T4TOTAL", "FREET4", "T3FREE", "THYROIDAB" in the last 72 hours. Anemia Panel: No results for input(s): "VITAMINB12", "FOLATE", "FERRITIN", "TIBC", "IRON", "RETICCTPCT" in the last 72 hours. Most Recent Urinalysis On File:     Component Value Date/Time   COLORURINE YELLOW 10/29/2021 0656   APPEARANCEUR CLEAR 10/29/2021 0656   APPEARANCEUR Clear 12/12/2020 1046   LABSPEC 1.020 10/29/2021 0656   PHURINE 5.0 10/29/2021 0656   GLUCOSEU >=500 (A) 10/29/2021 0656   HGBUR SMALL (A) 10/29/2021 0656   BILIRUBINUR NEGATIVE 10/29/2021 0656   BILIRUBINUR Negative 12/12/2020 1046   KETONESUR NEGATIVE 10/29/2021 0656   PROTEINUR 100 (A) 10/29/2021 0656   UROBILINOGEN 0.2 08/26/2019 1516   UROBILINOGEN 0.2 04/18/2008 1153   NITRITE NEGATIVE 10/29/2021 0656   LEUKOCYTESUR NEGATIVE 10/29/2021 0656   Sepsis Labs: @LABRCNTIP (procalcitonin:4,lacticidven:4)  Recent Results (from the past 240 hour(s))  Resp panel by RT-PCR (RSV, Flu A&B, Covid) Anterior Nasal Swab     Status: None   Collection Time: 12/27/22  6:41 PM   Specimen: Anterior Nasal Swab  Result Value Ref Range Status   SARS Coronavirus 2 by RT PCR NEGATIVE NEGATIVE Final   Influenza A by PCR NEGATIVE NEGATIVE Final   Influenza B by PCR NEGATIVE NEGATIVE Final    Comment: (NOTE) The Xpert Xpress SARS-CoV-2/FLU/RSV plus assay is intended as an aid in the diagnosis of influenza from Nasopharyngeal swab specimens and should not be used as a sole basis for treatment. Nasal washings and aspirates are unacceptable for Xpert Xpress SARS-CoV-2/FLU/RSV testing.  Fact Sheet for Patients: BloggerCourse.com  Fact Sheet for Healthcare Providers: SeriousBroker.it  This  test is not yet approved or cleared by the [place] FDA and has been authorized for detection and/or diagnosis of SARS-CoV-2 by FDA under an Emergency Use Authorization (EUA). This EUA will remain in effect (meaning this test can be used) for the duration of the COVID-19 declaration under Section 564(b)(1) of the Act, 21 U.S.C. section 360bbb-3(b)(1), unless the authorization is terminated or revoked.     Resp Syncytial Virus by PCR NEGATIVE NEGATIVE Final    Comment: (NOTE) Fact Sheet for Patients: BloggerCourse.com  Fact Sheet for Healthcare Providers: SeriousBroker.it  This test is not yet approved or cleared by the [place] FDA and has been authorized for detection and/or diagnosis of SARS-CoV-2 by FDA under an Emergency Use Authorization (EUA). This EUA will remain in effect (meaning this test can be used) for the duration of the COVID-19 declaration under Section 564(b)(1) of the Act, 21 U.S.C. section 360bbb-3(b)(1), unless the authorization is terminated or revoked.  Performed at [facility], [address] [place], [place] [address]          Radiology Studies: CT ABDOMEN PELVIS W CONTRAST  Result Date: 12/27/2022 CLINICAL DATA:  Abdominal pain, vomiting EXAM: CT ABDOMEN AND PELVIS WITH CONTRAST TECHNIQUE: Multidetector CT imaging of the abdomen and pelvis was performed using the standard protocol following bolus administration of intravenous contrast. RADIATION DOSE REDUCTION: This exam was performed according to the departmental dose-optimization program which includes automated exposure control, adjustment  of the mA and/or kV according to patient size and/or use of iterative reconstruction technique. CONTRAST:  75mL OMNIPAQUE IOHEXOL 350 MG/ML SOLN COMPARISON:  02/28/2022 FINDINGS: Lower chest: Patchy ground-glass nodular densities in the lingula and left lower lobe could reflect small airways  disease/alveolitis or early pneumonia. No effusions. Extensive coronary artery calcifications. Hepatobiliary: Small layering gallstones within the gallbladder. Mild diffuse low-density throughout the liver suggesting fatty infiltration. No focal hepatic abnormality. Pancreas: No focal abnormality or ductal dilatation. Spleen: No focal abnormality.  Normal size. Adrenals/Urinary Tract: Severe chronic right hydronephrosis with right ureteral reimplantation. No left hydronephrosis. Multiple bilateral renal cysts appear benign. No follow-up imaging recommended. Adrenal glands unremarkable. Mild diffuse wall thickening within the urinary bladder possibly related to bladder outlet obstruction with enlarged prostate. Stomach/Bowel: Normal appendix. Stomach, large and small bowel grossly unremarkable. Vascular/Lymphatic: Aortic atherosclerosis. No evidence of aneurysm or adenopathy. Reproductive: Prostate enlargement Other: No free fluid or free air. Musculoskeletal: No acute bony abnormality. IMPRESSION: Severe chronic right hydronephrosis with reimplantation of the right ureter again noted. Bladder wall thickening. This may be related to bladder outlet obstruction with enlarged prostate. Recommend clinical correlation to exclude cystitis. Cholelithiasis. Hepatic steatosis. Coronary artery disease, aortic atherosclerosis. Patchy areas of ground-glass nodularity in the left lower lobe and lingula. This could reflect small airways disease/alveolitis or early infiltrate/pneumonia. Electronically Signed   By: Charlett Nose M.D.   On: 12/27/2022 21:28   DG Chest Port 1 View  Result Date: 12/27/2022 CLINICAL DATA:  Questionable sepsis EXAM: PORTABLE CHEST 1 VIEW COMPARISON:  12/14/2022 FINDINGS: The heart size and mediastinal contours are within normal limits. Both lungs are clear. The visualized skeletal structures are unremarkable. IMPRESSION: No active disease. Electronically Signed   By: Charlett Nose M.D.   On:  12/27/2022 21:25             LOS: 0 days    Time spent: 75 min    Randy Nielsen, DO [facility] 12/27/2022, 11:06 PM    Dictation software may have been used to generate the above note. Typos may occur and escape review in typed/dictated notes. Please contact Dr Lyn Hollingshead directly for clarity if needed.  Staff may message me via secure chat in Epic  but this may not receive an immediate response,  please page me for urgent matters!  If 7PM-7AM, please contact night coverage [email or web address]

## 2022-12-27 NOTE — ED Notes (Signed)
PT is more responsive and alert

## 2022-12-27 NOTE — ED Provider Notes (Signed)
[place] EMERGENCY DEPARTMENT AT [facility] Provider Note  CSN: [id number] Arrival date & time: 12/27/22 1817  Chief Complaint(s) Fatigue  HPI [name] is a 67 y.o. male history of CKD, diabetes, hypertension presenting from nursing facility.  Patient was brought to the emergency department as today he was noted to be lethargic and fatigued, much less interactive than typical for him.  Usually he is awake and alert, talking.  Unclear why he resides in a nursing facility.  Patient currently denies pain.  Does not know where he is currently other than that he is in the hospital.  Does not know the year.  History limited due to altered mental status.   Past Medical History Past Medical History:  Diagnosis Date   Acute kidney injury superimposed on chronic kidney disease (HCC) 02/10/2020   Acute on chronic kidney failure (HCC) 09/28/2019   Closed nondisplaced spiral fracture of shaft of right tibia    Community acquired pneumonia 03/24/2021   Complicated urinary tract infection 09/06/2016   Depression    Diabetes mellitus without complication (HCC)    GERD (gastroesophageal reflux disease)    Gout    right foot   Hemorrhoids    Hypertension    Macular degeneration    Microcytic anemia    PNA (pneumonia) 03/26/2021   Prostate cancer (HCC) 08/2009   Tubular adenoma    Patient Active Problem List   Diagnosis Date Noted   Severe sepsis w/ septic shock POA evidenced by encephalopathy, significant lactic acid elevation 12/27/2022   Sepsis (HCC) 12/27/2022   Acute subdural hematoma (HCC) 01/13/2022   CAD S/P percutaneous coronary angioplasty 01/13/2022   Left elbow pain 01/13/2022   NSTEMI (non-ST elevated myocardial infarction) (HCC) 10/29/2021   History of prostate cancer with recurrent urinary obstruction  03/24/2021   HLD (hyperlipidemia) 01/05/2021   Acute urinary retention 10/25/2020   Adjustment disorder with mixed anxiety and depressed mood    Ataxia  08/01/2020   Hydroureteronephrosis 02/10/2020   Sinus tachycardia 09/28/2019   Insomnia 08/26/2018   Nephropathy 03/26/2018   Urinary urgency 03/26/2018   Type 2 diabetes mellitus with diabetic nephropathy, without long-term current use of insulin (HCC) 03/26/2018   Essential hypertension 03/26/2018   Chronic kidney disease (CKD), stage III (moderate) (HCC) 03/26/2018   Conductive hearing loss, bilateral 11/07/2017   Microcytic anemia 02/17/2012   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 Authorizing Provider  amLODipine (NORVASC) 5 MG tablet Take 1 tablet (5 mg total) by mouth daily. 11/01/21  Yes Nooruddin, Jason Fila, MD  aspirin EC 81 MG tablet Take 1 tablet (81 mg total) by mouth daily. Swallow whole. 11/01/21  Yes Nooruddin, Jason Fila, MD  atorvastatin (LIPITOR) 80 MG tablet Take 1 tablet (80 mg total) by mouth daily. Patient taking differently: Take 80 mg by mouth at bedtime. 11/01/21  Yes Nooruddin, Jason Fila, MD  clopidogrel (PLAVIX) 75 MG tablet Take 75 mg by mouth daily.   Yes [provider]  Colchicine 0.6 MG CAPS Take 0.6-1.2 mg by mouth See admin instructions. Give 2 capsules (1.2 mg) every 24 hours as needed for gout. If pain has not subsided with 2 capsules, may administer an additional 1 capsule (0.6 mg) one hour after receiving the first 2 capsules.   Yes [provider]  dapagliflozin propanediol (FARXIGA) 10 MG TABS tablet Take 1 tablet (10 mg total) by mouth daily.  11/09/21  Yes Nooruddin, Jason Fila, MD  divalproex (DEPAKOTE ER) 250 MG 24 hr tablet Take 250 mg by mouth 2 (two) times daily. 12/09/22  Yes [provider]  finasteride (PROSCAR) 5 MG tablet Take 1 tablet (5 mg total) by mouth daily. 11/01/21  Yes Nooruddin, Jason Fila, MD  HYDROcodone-acetaminophen (NORCO/VICODIN) 5-325 MG tablet Take 1-2 tablets by mouth every 6 (six) hours as needed for  moderate pain or severe pain. Patient taking differently: Take 1-2 tablets by mouth See admin instructions. Give 1 tablet (moderate pain) or 2 tablets (severe pain) every 6 hours as needed. 01/15/22  Yes Osvaldo Shipper, MD  insulin lispro (HUMALOG) 100 UNIT/ML KwikPen Inject 4 Units into the skin 3 (three) times daily before meals. 12/30/21  Yes [provider]  LANTUS SOLOSTAR 100 UNIT/ML Solostar Pen Inject 20 Units into the skin daily. 12/05/22  Yes [provider]  LORazepam (ATIVAN) 0.5 MG tablet Take 1 tablet (0.5 mg total) by mouth every 8 (eight) hours as needed for anxiety. Patient taking differently: Take 0.25 mg by mouth 2 (two) times daily. 01/15/22  Yes Osvaldo Shipper, MD  memantine (NAMENDA) 10 MG tablet Take 10 mg by mouth at bedtime.   Yes [provider]  metoprolol succinate (TOPROL-XL) 25 MG 24 hr tablet Take 1 tablet (25 mg total) by mouth daily. 10/31/21  Yes Nooruddin, Jason Fila, MD  nystatin (MYCOSTATIN/NYSTOP) powder Apply 1 Application topically 3 (three) times daily. To groin   Yes [provider]  OLANZapine (ZYPREXA) 2.5 MG tablet Take 2.5 mg by mouth 2 (two) times daily. 11/21/22  Yes [provider]  sertraline (ZOLOFT) 50 MG tablet Take 1 tablet (50 mg total) by mouth daily. Patient taking differently: Take 75 mg by mouth daily. 09/12/21  Yes [place], Alvira Philips, DO  Zinc Oxide (DESITIN EX) Apply 1 application  topically as needed (skin irritation to bilateral inner thighs and groin area).   Yes [provider]                                                                                                                                    Past Surgical History Past Surgical History:  Procedure Laterality Date   CORONARY PRESSURE/FFR STUDY N/A 11/07/2021   Procedure: INTRAVASCULAR PRESSURE WIRE/FFR STUDY;  Surgeon: Orbie Pyo, MD;  Location: [facility] INVASIVE CV LAB;  Service: Cardiovascular;  Laterality: N/A;   CORONARY STENT  INTERVENTION N/A 10/29/2021   Procedure: CORONARY STENT INTERVENTION;  Surgeon: [place], Peter M, MD;  Location: [facility] INVASIVE CV LAB;  Service: Cardiovascular;  Laterality: N/A;  Mid LAD   CYSTOSCOPY WITH RETROGRADE PYELOGRAM, URETEROSCOPY AND STENT PLACEMENT Right 07/31/2016   Procedure: CYSTOSCOPY WITH RIGHT  RETROGRADE PYELOGRAM, URETEROSCOPY;  Surgeon: Crist Fat, MD;  Location: [facility];  Service: Urology;  Laterality: Right;   INGUINAL HERNIA REPAIR     Left ankle joint fusion  1981   LEFT HEART CATH  AND CORONARY ANGIOGRAPHY N/A 10/29/2021   Procedure: LEFT HEART CATH AND CORONARY ANGIOGRAPHY;  Surgeon: [place], Peter M, MD;  Location: [facility] INVASIVE CV LAB;  Service: Cardiovascular;  Laterality: N/A;   LEFT HEART CATH AND CORONARY ANGIOGRAPHY N/A 11/07/2021   Procedure: LEFT HEART CATH AND CORONARY ANGIOGRAPHY;  Surgeon: Orbie Pyo, MD;  Location: [facility] INVASIVE CV LAB;  Service: Cardiovascular;  Laterality: N/A;   PROSTATE BIOPSY     x 2   URETERAL REIMPLANTION  07/31/2016   Procedure: URETERAL REIMPLANT, right boari flap, right psoas hitch;  Surgeon: Crist Fat, MD;  Location: [facility];  Service: Urology;;   Family History Family History  Problem Relation Age of Onset   Lung cancer Mother        Deceased   Pancreatic cancer Father    Heart disease Father        Deceased   Throat cancer Brother    Lung cancer Maternal Uncle        nephew   Diabetes Other     Social History Social History   Tobacco Use   Smoking status: Never   Smokeless tobacco: Never  Vaping Use   Vaping status: Never Used  Substance Use Topics   Alcohol use: Not Currently   Drug use: No   Allergies Haldol [haloperidol]  Review of Systems Review of Systems  All other systems reviewed and are negative.   Physical Exam Vital Signs  I have reviewed the triage vital signs BP 114/88   Pulse (!) 106   Temp 97.8 F (36.6 C) (Axillary)   Resp 18   Ht 5\' 6"  (1.676 m)   Wt 90.7 kg    SpO2 96%   BMI 32.28 kg/m  Physical Exam Vitals and nursing note reviewed.  Constitutional:      General: He is in acute distress.     Appearance: Normal appearance.  HENT:     Mouth/Throat:     Mouth: Mucous membranes are dry.  Eyes:     Conjunctiva/sclera: Conjunctivae normal.  Cardiovascular:     Rate and Rhythm: Regular rhythm. Tachycardia present.  Pulmonary:     Effort: Pulmonary effort is normal. No respiratory distress.     Breath sounds: Normal breath sounds.  Abdominal:     General: Abdomen is flat.     Palpations: Abdomen is soft.     Tenderness: There is no abdominal tenderness.  Musculoskeletal:     Right lower leg: No edema.     Left lower leg: No edema.  Skin:    General: Skin is warm and dry.     Capillary Refill: Capillary refill takes less than 2 seconds.  Neurological:     Mental Status: He is alert.     Comments: Oriented to self, not to situation, place or time.  Will follow simple commands.  Moves all 4 extremities but globally weak.  No cranial nerve deficit.  Psychiatric:        Mood and Affect: Mood normal.        Behavior: Behavior normal.     ED Results and Treatments Labs (all labs ordered are listed, but only abnormal results are displayed) Labs Reviewed  COMPREHENSIVE METABOLIC PANEL - Abnormal; Notable for the following components:      Result Value   CO2 18 (*)    Glucose, Bld 117 (*)    BUN 34 (*)    Creatinine, Ser 1.50 (*)    Calcium 8.2 (*)  Total Protein 6.4 (*)    Albumin 2.6 (*)    AST 108 (*)    ALT 102 (*)    Alkaline Phosphatase 290 (*)    Total Bilirubin 2.1 (*)    GFR, Estimated 50 (*)    Anion gap 19 (*)    All other components within normal limits  CBC WITH DIFFERENTIAL/PLATELET - Abnormal; Notable for the following components:   WBC 19.3 (*)    RBC 6.60 (*)    Hemoglobin 18.5 (*)    HCT 55.8 (*)    RDW 18.2 (*)    Neutro Abs 17.0 (*)    Monocytes Absolute 1.4 (*)    Abs Immature Granulocytes 0.08 (*)     All other components within normal limits  PROTIME-INR - Abnormal; Notable for the following components:   Prothrombin Time 75.9 (*)    INR 9.4 (*)    All other components within normal limits  APTT - Abnormal; Notable for the following components:   aPTT 105 (*)    All other components within normal limits  I-STAT CG4 LACTIC ACID, ED - Abnormal; Notable for the following components:   Lactic Acid, Venous 2.3 (*)    All other components within normal limits  I-STAT CG4 LACTIC ACID, ED - Abnormal; Notable for the following components:   Lactic Acid, Venous >15.0 (*)    All other components within normal limits  RESP PANEL BY RT-PCR (RSV, FLU A&B, COVID)  RVPGX2  CULTURE, BLOOD (ROUTINE X 2)  CULTURE, BLOOD (ROUTINE X 2)  URINALYSIS, W/ REFLEX TO CULTURE (INFECTION SUSPECTED)  LACTIC ACID, PLASMA  HIV ANTIBODY (ROUTINE TESTING W REFLEX)  PROTIME-INR  TSH  COMPREHENSIVE METABOLIC PANEL  CBC  LACTIC ACID, PLASMA                                                                                                                          Radiology CT ABDOMEN PELVIS W CONTRAST  Result Date: 12/27/2022 CLINICAL DATA:  Abdominal pain, vomiting EXAM: CT ABDOMEN AND PELVIS WITH CONTRAST TECHNIQUE: Multidetector CT imaging of the abdomen and pelvis was performed using the standard protocol following bolus administration of intravenous contrast. RADIATION DOSE REDUCTION: This exam was performed according to the departmental dose-optimization program which includes automated exposure control, adjustment of the mA and/or kV according to patient size and/or use of iterative reconstruction technique. CONTRAST:  75mL OMNIPAQUE IOHEXOL 350 MG/ML SOLN COMPARISON:  02/28/2022 FINDINGS: Lower chest: Patchy ground-glass nodular densities in the lingula and left lower lobe could reflect small airways disease/alveolitis or early pneumonia. No effusions. Extensive coronary artery calcifications. Hepatobiliary: Small  layering gallstones within the gallbladder. Mild diffuse low-density throughout the liver suggesting fatty infiltration. No focal hepatic abnormality. Pancreas: No focal abnormality or ductal dilatation. Spleen: No focal abnormality.  Normal size. Adrenals/Urinary Tract: Severe chronic right hydronephrosis with right ureteral reimplantation. No left hydronephrosis. Multiple bilateral renal cysts appear benign. No follow-up imaging recommended. Adrenal glands unremarkable. Mild diffuse wall  thickening within the urinary bladder possibly related to bladder outlet obstruction with enlarged prostate. Stomach/Bowel: Normal appendix. Stomach, large and small bowel grossly unremarkable. Vascular/Lymphatic: Aortic atherosclerosis. No evidence of aneurysm or adenopathy. Reproductive: Prostate enlargement Other: No free fluid or free air. Musculoskeletal: No acute bony abnormality. IMPRESSION: Severe chronic right hydronephrosis with reimplantation of the right ureter again noted. Bladder wall thickening. This may be related to bladder outlet obstruction with enlarged prostate. Recommend clinical correlation to exclude cystitis. Cholelithiasis. Hepatic steatosis. Coronary artery disease, aortic atherosclerosis. Patchy areas of ground-glass nodularity in the left lower lobe and lingula. This could reflect small airways disease/alveolitis or early infiltrate/pneumonia. Electronically Signed   By: Charlett Nose M.D.   On: 12/27/2022 21:28   DG Chest Port 1 View  Result Date: 12/27/2022 CLINICAL DATA:  Questionable sepsis EXAM: PORTABLE CHEST 1 VIEW COMPARISON:  12/14/2022 FINDINGS: The heart size and mediastinal contours are within normal limits. Both lungs are clear. The visualized skeletal structures are unremarkable. IMPRESSION: No active disease. Electronically Signed   By: Charlett Nose M.D.   On: 12/27/2022 21:25    Pertinent labs & imaging results that were available during my care of the patient were reviewed by me  and considered in my medical decision making (see MDM for details).  Medications Ordered in ED Medications  lactated ringers infusion (has no administration in time range)  metroNIDAZOLE (FLAGYL) IVPB 500 mg (has no administration in time range)  docusate sodium (COLACE) capsule 100 mg (has no administration in time range)  polyethylene glycol (MIRALAX / GLYCOLAX) packet 17 g (has no administration in time range)  ceFEPIme (MAXIPIME) 2 g in sodium chloride 0.9 % 100 mL IVPB (0 g Intravenous Stopped 12/27/22 2042)  metroNIDAZOLE (FLAGYL) IVPB 500 mg (0 mg Intravenous Stopped 12/27/22 2156)  lactated ringers bolus 2,238 mL (0 mLs Intravenous Stopped 12/27/22 2319)  vancomycin (VANCOREADY) IVPB 2000 mg/400 mL (0 mg Intravenous Stopped 12/27/22 2319)  iohexol (OMNIPAQUE) 350 MG/ML injection 75 mL (75 mLs Intravenous Contrast Given 12/27/22 2101)                                                                                                                                     Procedures .Critical Care  Performed by: Lonell Grandchild, MD Authorized by: Lonell Grandchild, MD   Critical care provider statement:    Critical care time (minutes):  30   Critical care was necessary to treat or prevent imminent or life-threatening deterioration of the following conditions:  Sepsis   Critical care was time spent personally by me on the following activities:  Development of treatment plan with patient or surrogate, discussions with consultants, evaluation of patient's response to treatment, examination of patient, ordering and review of laboratory studies, ordering and review of radiographic studies, ordering and performing treatments and interventions, pulse oximetry, re-evaluation of patient's condition and review of old charts   (including critical care time)  Medical Decision Making / ED Course   MDM:  67 year old presenting to the emergency department with weakness.  Patient mildly  ill-appearing, tachycardic with fever.  Suspect underlying infectious process.  Differential includes urinary infection, pneumonia, COVID infection.  No meningismus to suggest CNS infection, no headache.  No abdominal tenderness to suggest intra-abdominal infection.  No rashes or wounds noted on exam.  Patient will give sepsis bolus of IV fluids as well as antibiotics.  Will check labs.  Will check imaging including chest x-ray.  Given altered mental status patient will need to be admitted.    Clinical Course as of 12/27/22 2335  Fri Dec 27, 2022  2333 CXR clear, CT abdomen with signs of possible pneumonia. Admitted to hospitalist for sepsis.  [WS]    Clinical Course User Index [WS] Lonell Grandchild, MD     Additional history obtained: -Additional history obtained from ems -External records from outside source obtained and reviewed including: Chart review including previous notes, labs, imaging, consultation notes including prior ER notes   Lab Tests: -I ordered, reviewed, and interpreted labs.   The pertinent results include:   Labs Reviewed  COMPREHENSIVE METABOLIC PANEL - Abnormal; Notable for the following components:      Result Value   CO2 18 (*)    Glucose, Bld 117 (*)    BUN 34 (*)    Creatinine, Ser 1.50 (*)    Calcium 8.2 (*)    Total Protein 6.4 (*)    Albumin 2.6 (*)    AST 108 (*)    ALT 102 (*)    Alkaline Phosphatase 290 (*)    Total Bilirubin 2.1 (*)    GFR, Estimated 50 (*)    Anion gap 19 (*)    All other components within normal limits  CBC WITH DIFFERENTIAL/PLATELET - Abnormal; Notable for the following components:   WBC 19.3 (*)    RBC 6.60 (*)    Hemoglobin 18.5 (*)    HCT 55.8 (*)    RDW 18.2 (*)    Neutro Abs 17.0 (*)    Monocytes Absolute 1.4 (*)    Abs Immature Granulocytes 0.08 (*)    All other components within normal limits  PROTIME-INR - Abnormal; Notable for the following components:   Prothrombin Time 75.9 (*)    INR 9.4 (*)    All  other components within normal limits  APTT - Abnormal; Notable for the following components:   aPTT 105 (*)    All other components within normal limits  I-STAT CG4 LACTIC ACID, ED - Abnormal; Notable for the following components:   Lactic Acid, Venous 2.3 (*)    All other components within normal limits  I-STAT CG4 LACTIC ACID, ED - Abnormal; Notable for the following components:   Lactic Acid, Venous >15.0 (*)    All other components within normal limits  RESP PANEL BY RT-PCR (RSV, FLU A&B, COVID)  RVPGX2  CULTURE, BLOOD (ROUTINE X 2)  CULTURE, BLOOD (ROUTINE X 2)  URINALYSIS, W/ REFLEX TO CULTURE (INFECTION SUSPECTED)  LACTIC ACID, PLASMA  HIV ANTIBODY (ROUTINE TESTING W REFLEX)  PROTIME-INR  TSH  COMPREHENSIVE METABOLIC PANEL  CBC  LACTIC ACID, PLASMA    Notable for abnormal LFTs, without acute hepatobiliary process on CT scan . Likely erroneous second lactate which has been resent  EKG   EKG Interpretation Date/Time:  Friday December 27 2022 18:28:55 EDT Ventricular Rate:  128 PR Interval:  127 QRS Duration:  86 QT Interval:  312  QTC Calculation: 456 R Axis:   -13  Text Interpretation: Sinus tachycardia Atrial premature complexes Abnormal T, consider ischemia, lateral leads Artifact in lead(s) I II III aVR aVL Confirmed by Alvino Blood ([id number]) on 12/27/2022 6:44:24 PM         Imaging Studies ordered: I ordered imaging studies including CXR, CT abd On my interpretation imaging demonstrates pneumonai I independently visualized and interpreted imaging. I agree with the radiologist interpretation   Medicines ordered and prescription drug management: Meds ordered this encounter  Medications   lactated ringers infusion   ceFEPIme (MAXIPIME) 2 g in sodium chloride 0.9 % 100 mL IVPB    Order Specific Question:   Antibiotic Indication:    Answer:   Other Indication (list below)    Order Specific Question:   Other Indication:    Answer:   Unknown source    metroNIDAZOLE (FLAGYL) IVPB 500 mg    Order Specific Question:   Antibiotic Indication:    Answer:   Other Indication (list below)    Order Specific Question:   Other Indication:    Answer:   Unknown source   DISCONTD: vancomycin (VANCOCIN) IVPB 1000 mg/200 mL premix    Order Specific Question:   Indication:    Answer:   Other Indication (list below)    Order Specific Question:   Other Indication:    Answer:   Unknown source   lactated ringers bolus 2,238 mL   vancomycin (VANCOREADY) IVPB 2000 mg/400 mL    Order Specific Question:   Indication:    Answer:   Sepsis   iohexol (OMNIPAQUE) 350 MG/ML injection 75 mL   metroNIDAZOLE (FLAGYL) IVPB 500 mg    Order Specific Question:   Antibiotic Indication:    Answer:   Other Indication (list below)    Order Specific Question:   Other Indication:    Answer:   Unknown source   docusate sodium (COLACE) capsule 100 mg   polyethylene glycol (MIRALAX / GLYCOLAX) packet 17 g    -I have reviewed the patients home medicines and have made adjustments as needed   Consultations Obtained: I requested consultation with the hospitalist,  and discussed lab and imaging findings as well as pertinent plan - they recommend: admission   Cardiac Monitoring: The patient was maintained on a cardiac monitor.  I personally viewed and interpreted the cardiac monitored which showed an underlying rhythm of: sinus tachycardia   Social Determinants of Health:  Diagnosis or treatment significantly limited by social determinants of health: obesity   Reevaluation: After the interventions noted above, I reevaluated the patient and found that their symptoms have improved  Co morbidities that complicate the patient evaluation  Past Medical History:  Diagnosis Date   Acute kidney injury superimposed on chronic kidney disease (HCC) 02/10/2020   Acute on chronic kidney failure (HCC) 09/28/2019   Closed nondisplaced spiral fracture of shaft of right tibia     Community acquired pneumonia 03/24/2021   Complicated urinary tract infection 09/06/2016   Depression    Diabetes mellitus without complication (HCC)    GERD (gastroesophageal reflux disease)    Gout    right foot   Hemorrhoids    Hypertension    Macular degeneration    Microcytic anemia    PNA (pneumonia) 03/26/2021   Prostate cancer (HCC) 08/2009   Tubular adenoma       Dispostion: Disposition decision including need for hospitalization was considered, and patient admitted to the hospital.    Final Clinical Impression(s) /  ED Diagnoses Final diagnoses:  Sepsis, due to unspecified organism, unspecified whether acute organ dysfunction present [facility])     This chart was dictated using voice recognition software.  Despite best efforts to proofread,  errors can occur which can change the documentation meaning.    Lonell Grandchild, MD 12/27/22 [phone]

## 2022-12-28 DIAGNOSIS — A419 Sepsis, unspecified organism: Secondary | ICD-10-CM | POA: Diagnosis not present

## 2022-12-28 DIAGNOSIS — R6520 Severe sepsis without septic shock: Secondary | ICD-10-CM | POA: Diagnosis not present

## 2022-12-28 LAB — CBG MONITORING, ED
Glucose-Capillary: 138 mg/dL — ABNORMAL HIGH (ref 70–99)
Glucose-Capillary: 113 mg/dL — ABNORMAL HIGH (ref 70–99)
Glucose-Capillary: 123 mg/dL — ABNORMAL HIGH (ref 70–99)
Glucose-Capillary: 125 mg/dL — ABNORMAL HIGH (ref 70–99)

## 2022-12-28 LAB — I-STAT ARTERIAL BLOOD GAS, ED
Acid-base deficit: 3 mmol/L — ABNORMAL HIGH (ref 0.0–2.0)
Bicarbonate: 20.3 mmol/L (ref 20.0–28.0)
Calcium, Ion: 1.19 mmol/L (ref 1.15–1.40)
HCT: 45 % (ref 39.0–52.0)
Hemoglobin: 15.3 g/dL (ref 13.0–17.0)
O2 Saturation: 94 %
Patient temperature: 100.2
Potassium: 3.2 mmol/L — ABNORMAL LOW (ref 3.5–5.1)
Sodium: 139 mmol/L (ref 135–145)
TCO2: 21 mmol/L — ABNORMAL LOW (ref 22–32)
pCO2 arterial: 33.8 mm[Hg] (ref 32–48)
pH, Arterial: 7.39 (ref 7.35–7.45)
pO2, Arterial: 72 mm[Hg] — ABNORMAL LOW (ref 83–108)

## 2022-12-28 LAB — URINALYSIS, W/ REFLEX TO CULTURE (INFECTION SUSPECTED)
Bilirubin Urine: NEGATIVE
Glucose, UA: 50 mg/dL — AB
Ketones, ur: 20 mg/dL — AB
Nitrite: POSITIVE — AB
Protein, ur: 100 mg/dL — AB
Specific Gravity, Urine: 1.045 — ABNORMAL HIGH (ref 1.005–1.030)
WBC, UA: >50 WBC/hpf (ref 0–5)
pH: 5 (ref 5.0–8.0)

## 2022-12-28 LAB — CBC
HCT: 44.8 % (ref 39.0–52.0)
Hemoglobin: 14.5 g/dL (ref 13.0–17.0)
MCH: 27.9 pg (ref 26.0–34.0)
MCHC: 32.4 g/dL (ref 30.0–36.0)
MCV: 86.2 fL (ref 80.0–100.0)
Platelets: 159 10*3/uL (ref 150–400)
RBC: 5.2 MIL/uL (ref 4.22–5.81)
RDW: 17 % — ABNORMAL HIGH (ref 11.5–15.5)
WBC: 14.4 10*3/uL — ABNORMAL HIGH (ref 4.0–10.5)
nRBC: 0 % (ref 0.0–0.2)

## 2022-12-28 LAB — COMPREHENSIVE METABOLIC PANEL
ALT: 75 U/L — ABNORMAL HIGH (ref 0–44)
AST: 76 U/L — ABNORMAL HIGH (ref 15–41)
Albumin: 1.9 g/dL — ABNORMAL LOW (ref 3.5–5.0)
Alkaline Phosphatase: 198 U/L — ABNORMAL HIGH (ref 38–126)
Anion gap: 14 (ref 5–15)
BUN: 31 mg/dL — ABNORMAL HIGH (ref 8–23)
CO2: 16 mmol/L — ABNORMAL LOW (ref 22–32)
Calcium: 7.8 mg/dL — ABNORMAL LOW (ref 8.9–10.3)
Chloride: 106 mmol/L (ref 98–111)
Creatinine, Ser: 1.25 mg/dL — ABNORMAL HIGH (ref 0.61–1.24)
GFR, Estimated: >60 mL/min (ref >60)
Glucose, Bld: 121 mg/dL — ABNORMAL HIGH (ref 70–99)
Potassium: 3.8 mmol/L (ref 3.5–5.1)
Sodium: 136 mmol/L (ref 135–145)
Total Bilirubin: 1.2 mg/dL (ref 0.3–1.2)
Total Protein: 5 g/dL — ABNORMAL LOW (ref 6.5–8.1)

## 2022-12-28 LAB — TECHNOLOGIST SMEAR REVIEW

## 2022-12-28 LAB — AMMONIA: Ammonia: 32 umol/L (ref 9–35)

## 2022-12-28 LAB — TSH: TSH: 1.856 u[IU]/mL (ref 0.350–4.500)

## 2022-12-28 LAB — HEPATITIS PANEL, ACUTE
HCV Ab: NONREACTIVE
Hep A IgM: NONREACTIVE
Hep B C IgM: NONREACTIVE
Hepatitis B Surface Ag: NONREACTIVE

## 2022-12-28 LAB — LACTIC ACID, PLASMA
Lactic Acid, Venous: 5 mmol/L (ref 0.5–1.9)
Lactic Acid, Venous: 7.4 mmol/L (ref 0.5–1.9)
Lactic Acid, Venous: 2.3 mmol/L (ref 0.5–1.9)

## 2022-12-28 LAB — GLUCOSE, CAPILLARY
Glucose-Capillary: 205 mg/dL — ABNORMAL HIGH (ref 70–99)
Glucose-Capillary: 181 mg/dL — ABNORMAL HIGH (ref 70–99)

## 2022-12-28 LAB — C DIFFICILE QUICK SCREEN W PCR REFLEX
C Diff antigen: NEGATIVE
C Diff interpretation: NOT DETECTED
C Diff toxin: NEGATIVE

## 2022-12-28 LAB — HEMOGLOBIN A1C
Hgb A1c MFr Bld: 7 % — ABNORMAL HIGH (ref 4.8–5.6)
Mean Plasma Glucose: 154.2 mg/dL

## 2022-12-28 LAB — HIV ANTIBODY (ROUTINE TESTING W REFLEX): HIV Screen 4th Generation wRfx: NONREACTIVE

## 2022-12-28 MED ORDER — SODIUM CHLORIDE 0.9 % IV SOLN
2.0000 g | Freq: Three times a day (TID) | INTRAVENOUS | Status: DC
  Administered 2022-12-28 – 2022-12-29 (×5): 2 g via INTRAVENOUS
  Filled 2022-12-28 (×5): qty 12.5

## 2022-12-28 MED ORDER — LACTATED RINGERS IV BOLUS
1000.0000 mL | Freq: Once | INTRAVENOUS | Status: AC
  Administered 2022-12-28: 1000 mL via INTRAVENOUS

## 2022-12-28 MED ORDER — POTASSIUM CHLORIDE CRYS ER 20 MEQ PO TBCR
30.0000 meq | EXTENDED_RELEASE_TABLET | Freq: Once | ORAL | Status: DC
  Filled 2022-12-28: qty 1

## 2022-12-28 MED ORDER — ATORVASTATIN CALCIUM 40 MG PO TABS
40.0000 mg | ORAL_TABLET | Freq: Every day | ORAL | Status: DC
  Administered 2022-12-28 – 2023-01-01 (×5): 40 mg via ORAL
  Filled 2022-12-28 (×5): qty 1

## 2022-12-28 MED ORDER — LORAZEPAM 0.5 MG PO TABS
0.2500 mg | ORAL_TABLET | Freq: Two times a day (BID) | ORAL | Status: DC | PRN
  Administered 2022-12-28: 0.25 mg via ORAL
  Filled 2022-12-28: qty 1

## 2022-12-28 MED ORDER — HYDRALAZINE HCL 20 MG/ML IJ SOLN: 10.0000 mg | Freq: Three times a day (TID) | INTRAMUSCULAR | Status: DC | PRN

## 2022-12-28 MED ORDER — FINASTERIDE 5 MG PO TABS
5.0000 mg | ORAL_TABLET | Freq: Every day | ORAL | Status: DC
  Administered 2022-12-28 – 2022-12-30 (×3): 5 mg via ORAL
  Filled 2022-12-28 (×5): qty 1

## 2022-12-28 MED ORDER — SERTRALINE HCL 25 MG PO TABS
50.0000 mg | ORAL_TABLET | Freq: Every day | ORAL | Status: DC
  Administered 2022-12-28 – 2022-12-30 (×3): 50 mg via ORAL
  Filled 2022-12-28 (×4): qty 2

## 2022-12-28 MED ORDER — INSULIN ASPART 100 UNIT/ML IJ SOLN
0.0000 [IU] | INTRAMUSCULAR | Status: DC
  Administered 2022-12-28: 3 [IU] via SUBCUTANEOUS
  Administered 2022-12-28 (×3): 1 [IU] via SUBCUTANEOUS
  Administered 2022-12-28: 2 [IU] via SUBCUTANEOUS

## 2022-12-28 MED ORDER — VANCOMYCIN HCL 1500 MG/300ML IV SOLN
1500.0000 mg | Freq: Once | INTRAVENOUS | Status: AC
  Administered 2022-12-28: 1500 mg via INTRAVENOUS
  Filled 2022-12-28: qty 300

## 2022-12-28 MED ORDER — LACTATED RINGERS IV SOLN: INTRAVENOUS | Status: AC

## 2022-12-28 MED ORDER — DIVALPROEX SODIUM ER 250 MG PO TB24
250.0000 mg | ORAL_TABLET | Freq: Two times a day (BID) | ORAL | Status: DC
  Administered 2022-12-28 – 2022-12-30 (×5): 250 mg via ORAL
  Filled 2022-12-28 (×6): qty 1

## 2022-12-28 MED ORDER — MEMANTINE HCL 10 MG PO TABS
10.0000 mg | ORAL_TABLET | Freq: Every day | ORAL | Status: DC
  Administered 2022-12-28 – 2023-01-01 (×5): 10 mg via ORAL
  Filled 2022-12-28 (×6): qty 1

## 2022-12-28 MED ORDER — ASPIRIN 81 MG PO TBEC
81.0000 mg | DELAYED_RELEASE_TABLET | Freq: Every day | ORAL | Status: DC
  Administered 2022-12-28 – 2022-12-30 (×3): 81 mg via ORAL
  Filled 2022-12-28 (×5): qty 1

## 2022-12-28 MED ORDER — OXYCODONE-ACETAMINOPHEN 5-325 MG PO TABS
1.0000 | ORAL_TABLET | Freq: Once | ORAL | Status: AC
  Administered 2022-12-28: 1 via ORAL
  Filled 2022-12-28: qty 1

## 2022-12-28 MED ORDER — METOPROLOL SUCCINATE ER 25 MG PO TB24
25.0000 mg | ORAL_TABLET | Freq: Every day | ORAL | Status: DC
  Administered 2022-12-28 – 2022-12-30 (×3): 25 mg via ORAL
  Filled 2022-12-28 (×5): qty 1

## 2022-12-28 MED ORDER — OLANZAPINE 2.5 MG PO TABS
2.5000 mg | ORAL_TABLET | Freq: Two times a day (BID) | ORAL | Status: DC
  Administered 2022-12-28 – 2022-12-30 (×5): 2.5 mg via ORAL
  Filled 2022-12-28 (×6): qty 1

## 2022-12-28 MED ORDER — CLOPIDOGREL BISULFATE 75 MG PO TABS
75.0000 mg | ORAL_TABLET | Freq: Every day | ORAL | Status: DC
  Administered 2022-12-28 – 2023-01-02 (×6): 75 mg via ORAL
  Filled 2022-12-28 (×7): qty 1

## 2022-12-28 NOTE — ED Notes (Signed)
All pt fall risk precaution are in place

## 2022-12-28 NOTE — Progress Notes (Signed)
PROGRESS NOTE  SNAPPER DECELLES ZOX:[id number] DOB: 03/02/1956 DOA: 12/27/2022 PCP: Pcp, No   LOS: 1 day   Brief Narrative / Interim history: 67 y.o. male w/ PMH HTN, HLD, IDDM, comes into the hospital with confusion, feeling sick to his stomach.  He tells me that he has been sick with abdominal discomfort and nausea for the past 3 days.  He also reports dysuria for the past 4 days.  He denies any fever or chills at home.  He tells me that he lives by himself, and he had a friend help him come to the hospital  Subjective / 24h Interval events: Alert this morning, tells me he is not feeling good.  Complains of being "sick to my stomach", as well as nausea.  Assesement and Plan: Principal problem Severe sepsis due to presumed UTI -urinalysis with pyuria, he also reported dysuria for the past several days.  For now he was placed on broad-spectrum antibiotics, continue, awaiting cultures  Active problems Acute metabolic encephalopathy-due to #1, based on H&P and my evaluation this morning he appears improved, he can contribute to the story and tell me how sick he has been feeling and for how long.  Seems to be improving at this point  Anion gap metabolic acidosis-due to lactic acidosis, continue fluids.  ABG reassuring with normal pH  LFT elevation -mild, likely in the setting of severe sepsis  CKD 3 AA -creatinine currently appears at baseline  DM 2 -continue insulin every 4, if allow a diet we will resume Lantus and sliding scale  Lab Results  Component Value Date   HGBA1C 7.0 (H) 12/28/2022   CBG (last 3)  Recent Labs    12/28/22 0150 12/28/22 0345 12/28/22 0743  GLUCAP 138* 113* 123*    Essential hypertension -hold home agents due to severe sepsis  Hypokalemia-replace potassium and continue to monitor  CAD, prior non-STEMI-continue aspirin and Plavix  Hyperlipidemia-continue statin  Scheduled Meds:  docusate sodium  100 mg Oral BID   insulin aspart  0-9 Units  Subcutaneous Q4H   Continuous Infusions:  ceFEPime (MAXIPIME) IV Stopped (12/28/22 0458)   lactated ringers 150 mL/hr at 12/28/22 0821   metronidazole     vancomycin     PRN Meds:.polyethylene glycol  Current Outpatient Medications  Medication Instructions   amLODipine (NORVASC) 5 mg, Oral, Daily   aspirin EC 81 mg, Oral, Daily, Swallow whole.   atorvastatin (LIPITOR) 40 mg, Oral, Daily at bedtime   clopidogrel (PLAVIX) 75 mg, Oral, Daily   Colchicine 0.6 mg, Oral, Every 12 hours   divalproex (DEPAKOTE ER) 250 mg, Oral, 2 times daily   Farxiga 10 mg, Oral, Daily   finasteride (PROSCAR) 5 mg, Oral, Daily   HYDROcodone-acetaminophen (NORCO/VICODIN) 5-325 MG tablet 1-2 tablets, Oral, Every 6 hours PRN   insulin lispro (HUMALOG) 4 Units, Subcutaneous, 3 times daily before meals   Lantus SoloStar 20 Units, Subcutaneous, Daily   LORazepam (ATIVAN) 0.5 mg, Oral, Every 8 hours PRN   memantine (NAMENDA) 10 mg, Oral, Daily at bedtime   metoprolol succinate (TOPROL-XL) 25 mg, Oral, Daily   nystatin (MYCOSTATIN/NYSTOP) powder 1 Application, Topical, 3 times daily, To groin   OLANZapine (ZYPREXA) 2.5 mg, Oral, 2 times daily   sertraline (ZOLOFT) 50 mg, Oral, Daily   Zinc Oxide (DESITIN EX) 1 application , Topical, As needed    Diet Orders (From admission, onward)     Start     Ordered   12/27/22 2256  Diet NPO time specified  Except for: Sips with Meds, Ice Chips  Diet effective now       Comments: Pend swallow eval  Question Answer Comment  Except for Sips with Meds   Except for Ice Chips      12/27/22 2257            DVT prophylaxis: SCDs Start: 12/27/22 2254   Lab Results  Component Value Date   PLT 159 12/28/2022      Code Status: Full Code  Family Communication: no family at bedside   Status is: Inpatient Remains inpatient appropriate because: severity of illness  Level of care: Progressive  Consultants:  none  Objective: Vitals:   12/28/22 0430 12/28/22  0445 12/28/22 0600 12/28/22 0746  BP: 133/76 137/87 (!) 140/90 (!) 130/94  Pulse: 98 94 94 98  Resp: 14 17 18 16   Temp:    98.4 F (36.9 C)  TempSrc:    Oral  SpO2: 99% 100% 99% 100%  Weight:      Height:        Intake/Output Summary (Last 24 hours) at 12/28/2022 0906 Last data filed at 12/28/2022 0747 Gross per 24 hour  Intake 2730.58 ml  Output 400 ml  Net 2330.58 ml   Wt Readings from Last 3 Encounters:  12/27/22 90.7 kg  12/14/22 90.7 kg  02/28/22 75.8 kg    Examination:  Constitutional: NAD Eyes: no scleral icterus ENMT: Mucous membranes are moist.  Neck: normal, supple Respiratory: clear to auscultation bilaterally, no wheezing, no crackles. Normal respiratory effort. No accessory muscle use.  Cardiovascular: Regular rate and rhythm, no murmurs / rubs / gallops. No LE edema.  Abdomen: non distended, no tenderness. Bowel sounds positive.  Musculoskeletal: no clubbing / cyanosis.   Data Reviewed: I have independently reviewed following labs and imaging studies   CBC Recent Labs  Lab 12/27/22 1930 12/28/22 0055 12/28/22 0446  WBC 19.3* 14.4*  --   HGB 18.5* 14.5 15.3  HCT 55.8* 44.8 45.0  PLT 237 159  --   MCV 84.5 86.2  --   MCH 28.0 27.9  --   MCHC 33.2 32.4  --   RDW 18.2* 17.0*  --   LYMPHSABS 0.7  --   --   MONOABS 1.4*  --   --   EOSABS 0.0  --   --   BASOSABS 0.1  --   --     Recent Labs  Lab 12/27/22 1930 12/27/22 1941 12/27/22 2153 12/27/22 2216 12/28/22 0040 12/28/22 0052 12/28/22 0055 12/28/22 0446 12/28/22 0521  NA 141  --   --   --   --   --  136 139  --   K 4.2  --   --   --   --   --  3.8 3.2*  --   CL 104  --   --   --   --   --  106  --   --   CO2 18*  --   --   --   --   --  16*  --   --   GLUCOSE 117*  --   --   --   --   --  121*  --   --   BUN 34*  --   --   --   --   --  31*  --   --   CREATININE 1.50*  --   --   --   --   --  1.25*  --   --   CALCIUM 8.2*  --   --   --   --   --  7.8*  --   --   AST 108*  --   --    --   --   --  76*  --   --   ALT 102*  --   --   --   --   --  75*  --   --   ALKPHOS 290*  --   --   --   --   --  198*  --   --   BILITOT 2.1*  --   --   --   --   --  1.2  --   --   ALBUMIN 2.6*  --   --   --   --   --  1.9*  --   --   LATICACIDVEN  --  2.3*  --  >15.0*  --  5.0*  --   --  7.4*  INR  --   --  9.4*  --   --   --   --   --   --   TSH 1.856  --   --   --   --   --   --   --   --   HGBA1C  --   --   --   --   --   --  7.0*  --   --   AMMONIA  --   --   --   --  32  --   --   --   --     ------------------------------------------------------------------------------------------------------------------ No results for input(s): "CHOL", "HDL", "LDLCALC", "TRIG", "CHOLHDL", "LDLDIRECT" in the last 72 hours.  Lab Results  Component Value Date   HGBA1C 7.0 (H) 12/28/2022   ------------------------------------------------------------------------------------------------------------------ Recent Labs    12/27/22 1930  TSH 1.856    Cardiac Enzymes No results for input(s): "CKMB", "TROPONINI", "MYOGLOBIN" in the last 168 hours.  Invalid input(s): "CK" ------------------------------------------------------------------------------------------------------------------    Component Value Date/Time   BNP 16.2 02/09/2021 1407    CBG: Recent Labs  Lab 12/28/22 0150 12/28/22 0345 12/28/22 0743  GLUCAP 138* 113* 123*    Recent Results (from the past 240 hour(s))  Resp panel by RT-PCR (RSV, Flu A&B, Covid) Anterior Nasal Swab     Status: None   Collection Time: 12/27/22  6:41 PM   Specimen: Anterior Nasal Swab  Result Value Ref Range Status   SARS Coronavirus 2 by RT PCR NEGATIVE NEGATIVE Final   Influenza A by PCR NEGATIVE NEGATIVE Final   Influenza B by PCR NEGATIVE NEGATIVE Final    Comment: (NOTE) The Xpert Xpress SARS-CoV-2/FLU/RSV plus assay is intended as an aid in the diagnosis of influenza from Nasopharyngeal swab specimens and should not be used as a sole  basis for treatment. Nasal washings and aspirates are unacceptable for Xpert Xpress SARS-CoV-2/FLU/RSV testing.  Fact Sheet for Patients: BloggerCourse.com  Fact Sheet for Healthcare Providers: SeriousBroker.it  This test is not yet approved or cleared by the [place] FDA and has been authorized for detection and/or diagnosis of SARS-CoV-2 by FDA under an Emergency Use Authorization (EUA). This EUA will remain in effect (meaning this test can be used) for the duration of the COVID-19 declaration under Section 564(b)(1) of the Act, 21 U.S.C. section 360bbb-3(b)(1), unless the authorization is terminated or revoked.     Resp Syncytial Virus by PCR  NEGATIVE NEGATIVE Final    Comment: (NOTE) Fact Sheet for Patients: BloggerCourse.com  Fact Sheet for Healthcare Providers: SeriousBroker.it  This test is not yet approved or cleared by the [place] FDA and has been authorized for detection and/or diagnosis of SARS-CoV-2 by FDA under an Emergency Use Authorization (EUA). This EUA will remain in effect (meaning this test can be used) for the duration of the COVID-19 declaration under Section 564(b)(1) of the Act, 21 U.S.C. section 360bbb-3(b)(1), unless the authorization is terminated or revoked.  Performed at [facility], [address], [place], [place] [address]      Radiology Studies: CT HEAD WO CONTRAST ( )  Result Date: 12/28/2022 CLINICAL DATA:  Mental status change, unknown cause EXAM: CT HEAD WITHOUT CONTRAST TECHNIQUE: Contiguous axial images were obtained from the base of the skull through the vertex without intravenous contrast. RADIATION DOSE REDUCTION: This exam was performed according to the departmental dose-optimization program which includes automated exposure control, adjustment of the mA and/or kV according to patient size and/or use of iterative  reconstruction technique. COMPARISON:  None Available. FINDINGS: Brain: There is atrophy and chronic small vessel disease changes. No acute intracranial abnormality. Specifically, no hemorrhage, hydrocephalus, mass lesion, acute infarction, or significant intracranial injury. Vascular: No hyperdense vessel or unexpected calcification. Skull: No acute calvarial abnormality. Sinuses/Orbits: No acute findings Other: None IMPRESSION: Atrophy, chronic microvascular disease. No acute intracranial abnormality. Electronically Signed   By: Charlett Nose M.D.   On: 12/28/2022 02:06   CT ABDOMEN PELVIS W CONTRAST  Result Date: 12/27/2022 CLINICAL DATA:  Abdominal pain, vomiting EXAM: CT ABDOMEN AND PELVIS WITH CONTRAST TECHNIQUE: Multidetector CT imaging of the abdomen and pelvis was performed using the standard protocol following bolus administration of intravenous contrast. RADIATION DOSE REDUCTION: This exam was performed according to the departmental dose-optimization program which includes automated exposure control, adjustment of the mA and/or kV according to patient size and/or use of iterative reconstruction technique. CONTRAST:  75mL OMNIPAQUE IOHEXOL 350 MG/ML SOLN COMPARISON:  02/28/2022 FINDINGS: Lower chest: Patchy ground-glass nodular densities in the lingula and left lower lobe could reflect small airways disease/alveolitis or early pneumonia. No effusions. Extensive coronary artery calcifications. Hepatobiliary: Small layering gallstones within the gallbladder. Mild diffuse low-density throughout the liver suggesting fatty infiltration. No focal hepatic abnormality. Pancreas: No focal abnormality or ductal dilatation. Spleen: No focal abnormality.  Normal size. Adrenals/Urinary Tract: Severe chronic right hydronephrosis with right ureteral reimplantation. No left hydronephrosis. Multiple bilateral renal cysts appear benign. No follow-up imaging recommended. Adrenal glands unremarkable. Mild diffuse wall  thickening within the urinary bladder possibly related to bladder outlet obstruction with enlarged prostate. Stomach/Bowel: Normal appendix. Stomach, large and small bowel grossly unremarkable. Vascular/Lymphatic: Aortic atherosclerosis. No evidence of aneurysm or adenopathy. Reproductive: Prostate enlargement Other: No free fluid or free air. Musculoskeletal: No acute bony abnormality. IMPRESSION: Severe chronic right hydronephrosis with reimplantation of the right ureter again noted. Bladder wall thickening. This may be related to bladder outlet obstruction with enlarged prostate. Recommend clinical correlation to exclude cystitis. Cholelithiasis. Hepatic steatosis. Coronary artery disease, aortic atherosclerosis. Patchy areas of ground-glass nodularity in the left lower lobe and lingula. This could reflect small airways disease/alveolitis or early infiltrate/pneumonia. Electronically Signed   By: Charlett Nose M.D.   On: 12/27/2022 21:28   DG Chest Port 1 View  Result Date: 12/27/2022 CLINICAL DATA:  Questionable sepsis EXAM: PORTABLE CHEST 1 VIEW COMPARISON:  12/14/2022 FINDINGS: The heart size and mediastinal contours are within normal limits. Both lungs are  clear. The visualized skeletal structures are unremarkable. IMPRESSION: No active disease. Electronically Signed   By: Charlett Nose M.D.   On: 12/27/2022 21:25     Pamella Pert, MD, PhD [facility]  Between 7 am - 7 pm I am available, please contact me via Amion (for emergencies) or Securechat (non urgent messages)  Between 7 pm - 7 am I am not available, please contact night coverage MD/APP via Amion

## 2022-12-28 NOTE — Evaluation (Signed)
Clinical/Bedside Swallow Evaluation Patient Details  Name: [name] MRN: [id number] Date of Birth: 1955-07-15  Today's Date: 12/28/2022 Time: SLP Start Time (ACUTE ONLY): 0955 SLP Stop Time (ACUTE ONLY): 1013 SLP Time Calculation (min) (ACUTE ONLY): 18 min  Past Medical History:  Past Medical History:  Diagnosis Date   Acute kidney injury superimposed on chronic kidney disease (HCC) 02/10/2020   Acute on chronic kidney failure (HCC) 09/28/2019   Closed nondisplaced spiral fracture of shaft of right tibia    Community acquired pneumonia 03/24/2021   Complicated urinary tract infection 09/06/2016   Depression    Diabetes mellitus without complication (HCC)    GERD (gastroesophageal reflux disease)    Gout    right foot   Hemorrhoids    Hypertension    Macular degeneration    Microcytic anemia    PNA (pneumonia) 03/26/2021   Prostate cancer (HCC) 08/2009   Tubular adenoma    Past Surgical History:  Past Surgical History:  Procedure Laterality Date   CORONARY PRESSURE/FFR STUDY N/A 11/07/2021   Procedure: INTRAVASCULAR PRESSURE WIRE/FFR STUDY;  Surgeon: Orbie Pyo, MD;  Location: [facility] IN[facility]ASIVE CV LAB;  Service: Cardiovascular;  Laterality: N/A;   CORONARY STENT INTERVENTION N/A 10/29/2021   Procedure: CORONARY STENT INTERVENTION;  Surgeon: Swaziland, Peter M, MD;  Location: [facility] INVASIVE CV LAB;  Service: Cardiovascular;  Laterality: N/A;  Mid LAD   CYSTOSCOPY WITH RETROGRADE PYELOGRAM, URETEROSCOPY AND STENT PLACEMENT Right 07/31/2016   Procedure: CYSTOSCOPY WITH RIGHT  RETROGRADE PYELOGRAM, URETEROSCOPY;  Surgeon: Crist Fat, MD;  Location: [facility];  Service: Urology;  Laterality: Right;   INGUINAL HERNIA REPAIR     Left ankle joint fusion  1981   LEFT HEART CATH AND CORONARY ANGIOGRAPHY N/A 10/29/2021   Procedure: LEFT HEART CATH AND CORONARY ANGIOGRAPHY;  Surgeon: Swaziland, Peter M, MD;  Location: [facility] INVASIVE CV LAB;  Service: Cardiovascular;  Laterality: N/A;   LEFT  HEART CATH AND CORONARY ANGIOGRAPHY N/A 11/07/2021   Procedure: LEFT HEART CATH AND CORONARY ANGIOGRAPHY;  Surgeon: Orbie Pyo, MD;  Location: [facility]VASIVE CV [facility];  Service: Cardiovascular;  Laterality: N/A;   PROSTATE BIOPSY     x 2   URETERAL REIMPLANTION  07/31/2016   Procedure: URETERAL REIMPLANT, right boari flap, right psoas hitch;  Surgeon: Crist Fat, MD;  Location: [facility];  Service: Urology;;   HPI:  67 y.o. male w/ PMH HTN, HLD, IDDM, comes into the hospital with confusion, feeling sick to his stomach with abdominal discomfort and nausea for the past 3 days.  He also reports dysuria for the past 4 days. Head CT negative. CXR no active diesease.    Assessment / Plan / Recommendation  Clinical Impression  Pt presents with normal oropharyngeal swallow although had episode of emesis during assessment. He has adequate dentition with xerostomia and SLP provided oral hygiene. Labial and lingual movements are within normal limits with strong cough. He consumed sips of thin liquid via cup and straw without s/s aspiration with timely swallow as well as applesauce. As he was masticating the graham cracker he started to dry heave and had mild emesis with eructation. Recommend he initiate a full liquid diet which may be easier due to his nausea and vomiting. ST does not need to follow this pt and as his nausea/vomiting improves, MD can upgrade his diet as tolerated. Recommend to crush meds at this time. ST to sign off. SLP Visit Diagnosis: Dysphagia, unspecified (R13.10)    Aspiration Risk  No limitations    Diet Recommendation Thin liquid;Other (Comment) (full liquid)    Liquid Administration via: Cup;Straw Medication Administration: Crushed with puree Supervision: Patient able to self feed Compensations: Slow rate;Small sips/bites Postural Changes: Seated upright at 90 degrees    Other  Recommendations Oral Care Recommendations: Oral care BID    Recommendations for follow up  therapy are one component of a multi-disciplinary discharge planning process, led by the attending physician.  Recommendations may be updated based on patient status, additional functional criteria and insurance authorization.  Follow up Recommendations No SLP follow up      Assistance Recommended at Discharge    Functional Status Assessment Patient has not had a recent decline in their functional status  Frequency and Duration            Prognosis        Swallow Study   General Date of Onset: 12/27/22 HPI: 67 y.o. male w/ PMH HTN, HLD, IDDM, comes into the hospital with confusion, feeling sick to his stomach with abdominal discomfort and nausea for the past 3 days.  He also reports dysuria for the past 4 days. Head CT negative. CXR no active diesease. Type of Study: Bedside Swallow Evaluation Previous Swallow Assessment:  (none) Diet Prior to this Study: NPO Temperature Spikes Noted: No Respiratory Status: Room air History of Recent Intubation: No Behavior/Cognition: Alert;Pleasant mood;Cooperative Oral Cavity Assessment: Dry Oral Care Completed by SLP: Yes Oral Cavity - Dentition: Adequate natural dentition Vision: Functional for self-feeding Self-Feeding Abilities: Able to feed self Patient Positioning: Upright in bed Baseline Vocal Quality: Normal Volitional Cough: Strong Volitional Swallow: Able to elicit    Oral/Motor/Sensory Function Overall Oral Motor/Sensory Function: Within functional limits   Ice Chips Ice chips: Not tested   Thin Liquid Thin Liquid: Within functional limits Presentation: Cup;Straw    Nectar Thick Nectar Thick Liquid: Not tested   Honey Thick Honey Thick Liquid: Not tested   Puree Puree: Within functional limits   Solid     Solid: Within functional limits (had emesis with the cracker but mastication functional)      Arrianna Catala, Breck Coons 12/28/2022,10:30 AM

## 2022-12-28 NOTE — ED Notes (Signed)
Attempted to give pt medication crushed with applesauce and pt had a coughing fit after 3rd scoop of applesauce. Pt agreed to hold off on oral intake for now. Dr. Elvera Lennox notified. Pt O2 saturation remained in upper 90's. Current VSS. Distress resolved. Ongoing care.

## 2022-12-28 NOTE — Progress Notes (Signed)
  Abdominal pain: Patient is complaining about lower abdominal pain.  Given patient has acute metabolic encephalopathy cannot really pinpoint which part of his belly he is hurting.  Per chart review patient has been admitted for sepsis in setting of UTI. I believe abdominal pain most likely related to bladder spasm from UTI. -Giving 1 dose of Percocet 5 mg.  Elevated blood pressure: -Bedside nurse reported patient's systolic blood pressure elevating up to 160-170. -Ordering hydralazine as needed.

## 2022-12-28 NOTE — ED Notes (Signed)
ED TO INPATIENT HANDOFF REPORT  ED Nurse Name and Phone #: Leavy Cella Dontrelle Mazon [phone]  S Name/Age/Gender [name] 67 y.o. male Room/Bed: 003C/003C  Code Status   Code Status: Full Code  Home/SNF/Other Home Patient oriented to: self Is this baseline? No   Triage Complete: Triage complete  Chief Complaint Sepsis [facility]) [A41.9]  Triage Note "From [facility], they said he vomited some brownish/bloody emesis today. He is usually very talkative and alert but he has been very weak and lethargic today and just not himself" per EMS   Allergies Allergies  Allergen Reactions   Haldol [Haloperidol] Other (See Comments)    Per family cause life threatening complications to patient Per Pt, it causes "complications with my mouth"    Level of Care/Admitting Diagnosis ED Disposition     ED Disposition  Admit   Condition  --   Comment  Hospital Area: [facility] [[id number]]  Level of Care: Progressive [102]  Admit to Progressive based on following criteria: MULTISYSTEM THREATS such as stable sepsis, metabolic/electrolyte imbalance with or without encephalopathy that is responding to early treatment.  May [facility] patient to [facility] or [facility] if equivalent level of care is available:: Yes  Covid Evaluation: Confirmed COVID Negative  Diagnosis: Sepsis [facility]) [[id number]]  Admitting Physician: Sunnie Nielsen [[id number]]  Attending Physician: Sunnie Nielsen [phone]  Certification:: I certify this patient will need inpatient services for at least 2 midnights  Expected Medical Readiness: 12/30/2022          B Medical/Surgery History Past Medical History:  Diagnosis Date   Acute kidney injury superimposed on chronic kidney disease (HCC) 02/10/2020   Acute on chronic kidney failure (HCC) 09/28/2019   Closed nondisplaced spiral fracture of shaft of right tibia    Community acquired pneumonia 03/24/2021   Complicated urinary tract infection 09/06/2016    Depression    Diabetes mellitus without complication (HCC)    GERD (gastroesophageal reflux disease)    Gout    right foot   Hemorrhoids    Hypertension    Macular degeneration    Microcytic anemia    PNA (pneumonia) 03/26/2021   Prostate cancer (HCC) 08/2009   Tubular adenoma    Past Surgical History:  Procedure Laterality Date   CORONARY PRESSURE/FFR STUDY N/A 11/07/2021   Procedure: INTRAVASCULAR PRESSURE WIRE/FFR STUDY;  Surgeon: Orbie Pyo, MD;  Location: [facility] INVASIVE CV LAB;  Service: Cardiovascular;  Laterality: N/A;   CORONARY STENT INTERVENTION N/A 10/29/2021   Procedure: CORONARY STENT INTERVENTION;  Surgeon: Swaziland, Peter M, MD;  Location: [facility] INVASIVE CV LAB;  Service: Cardiovascular;  Laterality: N/A;  Mid LAD   CYSTOSCOPY WITH RETROGRADE PYELOGRAM, URETEROSCOPY AND STENT PLACEMENT Right 07/31/2016   Procedure: CYSTOSCOPY WITH RIGHT  RETROGRADE PYELOGRAM, URETEROSCOPY;  Surgeon: Crist Fat, MD;  Location: [facility] ORS;  Service: Urology;  Laterality: Right;   INGUINAL HERNIA REPAIR     Left ankle joint fusion  1981   LEFT HEART CATH AND CORONARY ANGIOGRAPHY N/A 10/29/2021   Procedure: LEFT HEART CATH AND CORONARY ANGIOGRAPHY;  Surgeon: Swaziland, Peter M, MD;  Location: [facility] INVASIVE CV LAB;  Service: Cardiovascular;  Laterality: N/A;   LEFT HEART CATH AND CORONARY ANGIOGRAPHY N/A 11/07/2021   Procedure: LEFT HEART CATH AND CORONARY ANGIOGRAPHY;  Surgeon: Orbie Pyo, MD;  Location: [facility] INVASIVE CV LAB;  Service: Cardiovascular;  Laterality: N/A;   PROSTATE BIOPSY     x 2   URETERAL REIMPLANTION  07/31/2016   Procedure:  URETERAL REIMPLANT, right boari flap, right psoas hitch;  Surgeon: Crist Fat, MD;  Location: [facility] ORS;  Service: Urology;;     A IV Location/Drains/Wounds Patient Lines/Drains/Airways Status     Active Line/Drains/Airways     Name Placement date Placement time Site Days   Peripheral IV 12/27/22 22 G 1" Posterior;Right Hand 12/27/22  1800   Hand  1   Peripheral IV 12/27/22 20 G Anterior;Distal;Right;Upper Arm 12/27/22  1953  Arm  1   External Urinary Catheter 12/28/22  --  --  less than 1            Intake/Output Last 24 hours  Intake/Output Summary (Last 24 hours) at 12/28/2022 1348 Last data filed at 12/28/2022 1348 Gross per 24 hour  Intake 2830.58 ml  Output 400 ml  Net 2430.58 ml    Labs/Imaging Results for orders placed or performed during the hospital encounter of 12/27/22 (from the past 48 hour(s))  Resp panel by RT-PCR (RSV, Flu A&B, Covid) Anterior Nasal Swab     Status: None   Collection Time: 12/27/22  6:41 PM   Specimen: Anterior Nasal Swab  Result Value Ref Range   SARS Coronavirus 2 by RT PCR NEGATIVE NEGATIVE   Influenza A by PCR NEGATIVE NEGATIVE   Influenza B by PCR NEGATIVE NEGATIVE    Comment: (NOTE) The Xpert Xpress SARS-CoV-2/FLU/RSV plus assay is intended as an aid in the diagnosis of influenza from Nasopharyngeal swab specimens and should not be used as a sole basis for treatment. Nasal washings and aspirates are unacceptable for Xpert Xpress SARS-CoV-2/FLU/RSV testing.  Fact Sheet for Patients: BloggerCourse.com  Fact Sheet for Healthcare Providers: SeriousBroker.it  This test is not yet approved or cleared by the [place] FDA and has been authorized for detection and/or diagnosis of SARS-CoV-2 by FDA under an Emergency Use Authorization (EUA). This EUA will remain in effect (meaning this test can be used) for the duration of the COVID-19 declaration under Section 564(b)(1) of the Act, 21 U.S.C. section 360bbb-3(b)(1), unless the authorization is terminated or revoked.     Resp Syncytial Virus by PCR NEGATIVE NEGATIVE    Comment: (NOTE) Fact Sheet for Patients: BloggerCourse.com  Fact Sheet for Healthcare Providers: SeriousBroker.it  This test is not yet approved or  cleared by the [place] FDA and has been authorized for detection and/or diagnosis of SARS-CoV-2 by FDA under an Emergency Use Authorization (EUA). This EUA will remain in effect (meaning this test can be used) for the duration of the COVID-19 declaration under Section 564(b)(1) of the Act, 21 U.S.C. section 360bbb-3(b)(1), unless the authorization is terminated or revoked.  Performed at [facility], [address], [place], [place] [address]   Comprehensive metabolic panel     Status: Abnormal   Collection Time: 12/27/22  7:30 PM  Result Value Ref Range   Sodium 141 135 - 145 mmol/L   Potassium 4.2 3.5 - 5.1 mmol/L   Chloride 104 98 - 111 mmol/L   CO2 18 (L) 22 - 32 mmol/L   Glucose, Bld 117 (H) 70 - 99 mg/dL    Comment: Glucose reference range applies only to samples taken after fasting for at least 8 hours.   BUN 34 (H) 8 - 23 mg/dL   Creatinine, Ser 4.40 (H) 0.61 - 1.24 mg/dL   Calcium 8.2 (L) 8.9 - 10.3 mg/dL   Total Protein 6.4 (L) 6.5 - 8.1 g/dL   Albumin 2.6 (L) 3.5 - 5.0 g/dL  AST 108 (H) 15 - 41 U/L   ALT 102 (H) 0 - 44 U/L   Alkaline Phosphatase 290 (H) 38 - 126 U/L   Total Bilirubin 2.1 (H) 0.3 - 1.2 mg/dL   GFR, Estimated 50 (L) >60 mL/min    Comment: (NOTE) Calculated using the CKD-EPI Creatinine Equation (2021)    Anion gap 19 (H) 5 - 15    Comment: Performed at [facility], [address], [place], [place] [address]  CBC with Differential     Status: Abnormal   Collection Time: 12/27/22  7:30 PM  Result Value Ref Range   WBC 19.3 (H) 4.0 - 10.5 K/uL   RBC 6.60 (H) 4.22 - 5.81 MIL/uL   Hemoglobin 18.5 (H) 13.0 - 17.0 g/dL   HCT 19.1 (H) 47.8 - 29.5 %   MCV 84.5 80.0 - 100.0 fL   MCH 28.0 26.0 - 34.0 pg   MCHC 33.2 30.0 - 36.0 g/dL   RDW 62.1 (H) 30.8 - 65.7 %   Platelets 237 150 - 400 K/uL   nRBC 0.0 0.0 - 0.2 %   Neutrophils Relative % 89 %   Neutro Abs 17.0 (H) 1.7 - 7.7 K/uL   Lymphocytes Relative 4 %   Lymphs Abs 0.7 0.7 - 4.0  K/uL   Monocytes Relative 7 %   Monocytes Absolute 1.4 (H) 0.1 - 1.0 K/uL   Eosinophils Relative 0 %   Eosinophils Absolute 0.0 0.0 - 0.5 K/uL   Basophils Relative 0 %   Basophils Absolute 0.1 0.0 - 0.1 K/uL   Immature Granulocytes 0 %   Abs Immature Granulocytes 0.08 (H) 0.00 - 0.07 K/uL    Comment: Performed at [facility], [address], [place], [place] [address]  TSH     Status: None   Collection Time: 12/27/22  7:30 PM  Result Value Ref Range   TSH 1.856 0.350 - 4.500 uIU/mL    Comment: Performed by a 3rd Generation assay with a functional sensitivity of <=0.01 uIU/mL. Performed at [facility], [address] [place], [place] [address]   I-Stat Lactic Acid, ED     Status: Abnormal   Collection Time: 12/27/22  7:41 PM  Result Value Ref Range   Lactic Acid, Venous 2.3 (HH) 0.5 - 1.9 mmol/L   Comment NOTIFIED PHYSICIAN   Blood Culture (routine x 2)     Status: None (Preliminary result)   Collection Time: 12/27/22  7:50 PM   Specimen: BLOOD  Result Value Ref Range   Specimen Description BLOOD RIGHT ANTECUBITAL    Special Requests      BOTTLES DRAWN AEROBIC AND ANAEROBIC Blood Culture adequate volume   Culture      NO GROWTH < 24 HOURS Performed at [facility], [address] [place], [place] [address]    Report Status PENDING   Blood Culture (routine x 2)     Status: None (Preliminary result)   Collection Time: 12/27/22  7:50 PM   Specimen: BLOOD  Result Value Ref Range   Specimen Description BLOOD RIGHT ANTECUBITAL    Special Requests      BOTTLES DRAWN AEROBIC AND ANAEROBIC Blood Culture results may not be optimal due to an inadequate volume of blood received in culture bottles   Culture      NO GROWTH < 24 HOURS Performed at [facility], [address] [place], [place] [address]    Report Status PENDING  Protime-INR     Status: Abnormal   Collection Time: 12/27/22  9:53 PM  Result Value Ref Range   Prothrombin Time 75.9 (H) 11.4 -  15.2 seconds    Comment: SPECIMEN COLLECTED IN ANTICOAGULANT ADJUSTED TUBE DUE TO ELEVATED HEMATOCRIT   INR 9.4 (HH) 0.8 - 1.2    Comment: SPECIMEN COLLECTED IN ANTICOAGULANT ADJUSTED TUBE DUE TO ELEVATED HEMATOCRIT REPEATED TO VERIFY CRITICAL RESULT CALLED TO, READ BACK BY AND VERIFIED WITH: Meta Hatchet, RN 12/27/2022 2237 BTAYLOR (NOTE) INR goal varies based on device and disease states. Performed at [facility], [address] [place], [place] [address]   APTT     Status: Abnormal   Collection Time: 12/27/22  9:53 PM  Result Value Ref Range   aPTT 105 (H) 24 - 36 seconds    Comment:        IF BASELINE aPTT IS ELEVATED, SUGGEST PATIENT RISK ASSESSMENT BE USED TO DETERMINE APPROPRIATE ANTICOAGULANT THERAPY. SPECIMEN COLLECTED IN ANTICOAGULANT ADJUSTED TUBE DUE TO ELEVATED HEMATOCRIT Performed at [facility], [address] [place], [place] [address]   I-Stat Lactic Acid, ED     Status: Abnormal   Collection Time: 12/27/22 10:16 PM  Result Value Ref Range   Lactic Acid, Venous >15.0 (HH) 0.5 - 1.9 mmol/L   Comment NOTIFIED PHYSICIAN   Ammonia     Status: None   Collection Time: 12/28/22 12:40 AM  Result Value Ref Range   Ammonia 32 9 - 35 umol/L    Comment: HEMOLYSIS AT THIS LEVEL MAY AFFECT RESULT Performed at [facility], [address] [place], [place] [address]   Lactic acid, plasma     Status: Abnormal   Collection Time: 12/28/22 12:52 AM  Result Value Ref Range   Lactic Acid, Venous 5.0 (HH) 0.5 - 1.9 mmol/L    Comment: CRITICAL RESULT CALLED TO, READ BACK BY AND VERIFIED WITH T. MORRISON RN 12/28/22 @0139  BY J. WHITE Performed at [facility], [address], [place], [place] [address]   HIV Antibody (routine testing w rflx)     Status: None   Collection Time: 12/28/22 12:55 AM  Result Value Ref Range   HIV Screen 4th Generation wRfx Non Reactive Non Reactive    Comment: Performed at [facility], [address],  [place], [place] [address]  Comprehensive metabolic panel     Status: Abnormal   Collection Time: 12/28/22 12:55 AM  Result Value Ref Range   Sodium 136 135 - 145 mmol/L   Potassium 3.8 3.5 - 5.1 mmol/L   Chloride 106 98 - 111 mmol/L   CO2 16 (L) 22 - 32 mmol/L   Glucose, Bld 121 (H) 70 - 99 mg/dL    Comment: Glucose reference range applies only to samples taken after fasting for at least 8 hours.   BUN 31 (H) 8 - 23 mg/dL   Creatinine, Ser 1.30 (H) 0.61 - 1.24 mg/dL   Calcium 7.8 (L) 8.9 - 10.3 mg/dL   Total Protein 5.0 (L) 6.5 - 8.1 g/dL   Albumin 1.9 (L) 3.5 - 5.0 g/dL   AST 76 (H) 15 - 41 U/L   ALT 75 (H) 0 - 44 U/L   Alkaline Phosphatase 198 (H) 38 - 126 U/L   Total Bilirubin 1.2 0.3 - 1.2 mg/dL   GFR, Estimated >86 >57 mL/min    Comment: (NOTE) Calculated using the CKD-EPI Creatinine Equation (2021)    Anion gap 14  5 - 15    Comment: Performed at [facility], [address] [place], [place] [address]  CBC     Status: Abnormal   Collection Time: 12/28/22 12:55 AM  Result Value Ref Range   WBC 14.4 (H) 4.0 - 10.5 K/uL   RBC 5.20 4.22 - 5.81 MIL/uL   Hemoglobin 14.5 13.0 - 17.0 g/dL   HCT 91.4 78.2 - 95.6 %   MCV 86.2 80.0 - 100.0 fL   MCH 27.9 26.0 - 34.0 pg   MCHC 32.4 30.0 - 36.0 g/dL   RDW 21.3 (H) 08.6 - 57.8 %   Platelets 159 150 - 400 K/uL   nRBC 0.0 0.0 - 0.2 %    Comment: Performed at [facility], [address] [place], [place] [address]  Hemoglobin A1c     Status: Abnormal   Collection Time: 12/28/22 12:55 AM  Result Value Ref Range   Hgb A1c MFr Bld 7.0 (H) 4.8 - 5.6 %    Comment: (NOTE) Pre diabetes:          5.7%-6.4%  Diabetes:              >6.4%  Glycemic control for   <7.0% adults with diabetes    Mean Plasma Glucose 154.2 mg/dL    Comment: Performed at [facility], [address] [place], [place] [address]  Technologist smear review     Status: None   Collection Time: 12/28/22 12:55 AM  Result Value Ref Range   WBC  MORPHOLOGY MORPHOLOGY UNREMARKABLE    RBC MORPHOLOGY MORPHOLOGY UNREMARKABLE    Plt Morphology MORPHOLOGY UNREMARKABLE    Clinical Information elevation INR, eval periph smear     Comment: Performed at [facility], [address] [place], [place] [address]  Hepatitis panel, acute     Status: None   Collection Time: 12/28/22 12:55 AM  Result Value Ref Range   Hepatitis B Surface Ag NON REACTIVE NON REACTIVE   HCV Ab NON REACTIVE NON REACTIVE    Comment: (NOTE) Nonreactive HCV antibody screen is consistent with no HCV infections,  unless recent infection is suspected or other evidence exists to indicate HCV infection.     Hep A IgM NON REACTIVE NON REACTIVE   Hep B C IgM NON REACTIVE NON REACTIVE    Comment: Performed at [facility], [address] [place], [place] [address]  CBG monitoring, ED     Status: Abnormal   Collection Time: 12/28/22  1:50 AM  Result Value Ref Range   Glucose-Capillary 138 (H) 70 - 99 mg/dL    Comment: Glucose reference range applies only to samples taken after fasting for at least 8 hours.  CBG monitoring, ED     Status: Abnormal   Collection Time: 12/28/22  3:45 AM  Result Value Ref Range   Glucose-Capillary 113 (H) 70 - 99 mg/dL    Comment: Glucose reference range applies only to samples taken after fasting for at least 8 hours.  Urinalysis, w/ Reflex to Culture (Infection Suspected) -Urine, Catheterized     Status: Abnormal   Collection Time: 12/28/22  3:48 AM  Result Value Ref Range   Specimen Source URINE, CATHETERIZED    Color, Urine YELLOW YELLOW   APPearance CLOUDY (A) CLEAR   Specific Gravity, Urine 1.045 (H) 1.005 - 1.030   pH 5.0 5.0 - 8.0   Glucose, UA 50 (A) NEGATIVE mg/dL   Hgb urine dipstick SMALL (A)  NEGATIVE   Bilirubin Urine NEGATIVE NEGATIVE   Ketones, ur 20 (A) NEGATIVE mg/dL   Protein, ur 409 (A) NEGATIVE mg/dL   Nitrite POSITIVE (A) NEGATIVE   Leukocytes,Ua LARGE (A) NEGATIVE   RBC / HPF 11-20 0 - 5 RBC/hpf    WBC, UA >50 0 - 5 WBC/hpf    Comment:        Reflex urine culture not performed if WBC <=10, OR if Squamous epithelial cells >5. If Squamous epithelial cells >5 suggest recollection.    Bacteria, UA FEW (A) NONE SEEN   Squamous Epithelial / HPF 0-5 0 - 5 /HPF   Mucus PRESENT     Comment: Performed at [facility], [address] [place], [place] [address]  I-Stat arterial blood gas, ED     Status: Abnormal   Collection Time: 12/28/22  4:46 AM  Result Value Ref Range   pH, Arterial 7.390 7.35 - 7.45   pCO2 arterial 33.8 32 - 48 mmHg   pO2, Arterial 72 (L) 83 - 108 mmHg   Bicarbonate 20.3 20.0 - 28.0 mmol/L   TCO2 21 (L) 22 - 32 mmol/L   O2 Saturation 94 %   Acid-base deficit 3.0 (H) 0.0 - 2.0 mmol/L   Sodium 139 135 - 145 mmol/L   Potassium 3.2 (L) 3.5 - 5.1 mmol/L   Calcium, Ion 1.19 1.15 - 1.40 mmol/L   HCT 45.0 39.0 - 52.0 %   Hemoglobin 15.3 13.0 - 17.0 g/dL   Patient temperature 478.2 F    Collection site RADIAL, ALLEN'S TEST ACCEPTABLE    Drawn by RT    Sample type ARTERIAL   Lactic acid, plasma     Status: Abnormal   Collection Time: 12/28/22  5:21 AM  Result Value Ref Range   Lactic Acid, Venous 7.4 (HH) 0.5 - 1.9 mmol/L    Comment: CRITICAL VALUE NOTED. VALUE IS CONSISTENT WITH PREVIOUSLY REPORTED/CALLED VALUE Performed at [facility] Lab, [address], [place], [place] [address]   CBG monitoring, ED     Status: Abnormal   Collection Time: 12/28/22  7:43 AM  Result Value Ref Range   Glucose-Capillary 123 (H) 70 - 99 mg/dL    Comment: Glucose reference range applies only to samples taken after fasting for at least 8 hours.  CBG monitoring, ED     Status: Abnormal   Collection Time: 12/28/22 12:54 PM  Result Value Ref Range   Glucose-Capillary 125 (H) 70 - 99 mg/dL    Comment: Glucose reference range applies only to samples taken after fasting for at least 8 hours.   CT HEAD WO CONTRAST ( )  Result Date: 12/28/2022 CLINICAL DATA:  Mental status  change, unknown cause EXAM: CT HEAD WITHOUT CONTRAST TECHNIQUE: Contiguous axial images were obtained from the base of the skull through the vertex without intravenous contrast. RADIATION DOSE REDUCTION: This exam was performed according to the departmental dose-optimization program which includes automated exposure control, adjustment of the mA and/or kV according to patient size and/or use of iterative reconstruction technique. COMPARISON:  None Available. FINDINGS: Brain: There is atrophy and chronic small vessel disease changes. No acute intracranial abnormality. Specifically, no hemorrhage, hydrocephalus, mass lesion, acute infarction, or significant intracranial injury. Vascular: No hyperdense vessel or unexpected calcification. Skull: No acute calvarial abnormality. Sinuses/Orbits: No acute findings Other: None IMPRESSION: Atrophy, chronic microvascular disease. No acute intracranial abnormality. Electronically Signed   By: Charlett Nose M.D.   On: 12/28/2022 02:06   CT ABDOMEN  PELVIS W CONTRAST  Result Date: 12/27/2022 CLINICAL DATA:  Abdominal pain, vomiting EXAM: CT ABDOMEN AND PELVIS WITH CONTRAST TECHNIQUE: Multidetector CT imaging of the abdomen and pelvis was performed using the standard protocol following bolus administration of intravenous contrast. RADIATION DOSE REDUCTION: This exam was performed according to the departmental dose-optimization program which includes automated exposure control, adjustment of the mA and/or kV according to patient size and/or use of iterative reconstruction technique. CONTRAST:  75mL OMNIPAQUE IOHEXOL 350 MG/ML SOLN COMPARISON:  02/28/2022 FINDINGS: Lower chest: Patchy ground-glass nodular densities in the lingula and left lower lobe could reflect small airways disease/alveolitis or early pneumonia. No effusions. Extensive coronary artery calcifications. Hepatobiliary: Small layering gallstones within the gallbladder. Mild diffuse low-density throughout the liver  suggesting fatty infiltration. No focal hepatic abnormality. Pancreas: No focal abnormality or ductal dilatation. Spleen: No focal abnormality.  Normal size. Adrenals/Urinary Tract: Severe chronic right hydronephrosis with right ureteral reimplantation. No left hydronephrosis. Multiple bilateral renal cysts appear benign. No follow-up imaging recommended. Adrenal glands unremarkable. Mild diffuse wall thickening within the urinary bladder possibly related to bladder outlet obstruction with enlarged prostate. Stomach/Bowel: Normal appendix. Stomach, large and small bowel grossly unremarkable. Vascular/Lymphatic: Aortic atherosclerosis. No evidence of aneurysm or adenopathy. Reproductive: Prostate enlargement Other: No free fluid or free air. Musculoskeletal: No acute bony abnormality. IMPRESSION: Severe chronic right hydronephrosis with reimplantation of the right ureter again noted. Bladder wall thickening. This may be related to bladder outlet obstruction with enlarged prostate. Recommend clinical correlation to exclude cystitis. Cholelithiasis. Hepatic steatosis. Coronary artery disease, aortic atherosclerosis. Patchy areas of ground-glass nodularity in the left lower lobe and lingula. This could reflect small airways disease/alveolitis or early infiltrate/pneumonia. Electronically Signed   By: Charlett Nose M.D.   On: 12/27/2022 21:28   DG Chest Port 1 View  Result Date: 12/27/2022 CLINICAL DATA:  Questionable sepsis EXAM: PORTABLE CHEST 1 VIEW COMPARISON:  12/14/2022 FINDINGS: The heart size and mediastinal contours are within normal limits. Both lungs are clear. The visualized skeletal structures are unremarkable. IMPRESSION: No active disease. Electronically Signed   By: Charlett Nose M.D.   On: 12/27/2022 21:25    Pending Labs Unresulted Labs (From admission, onward)     Start     Ordered   12/29/22 0500  Comprehensive metabolic panel  Tomorrow morning,   R        12/28/22 0916   12/29/22 0500  CBC   Tomorrow morning,   R        12/28/22 0916   12/29/22 0500  Magnesium  Tomorrow morning,   R        12/28/22 0916   12/28/22 0348  Urine Culture  Once,   R        12/28/22 0348   12/28/22 0230  Protime-INR  Once,   R        12/28/22 0230   12/28/22 0230  APTT  Once,   R        12/28/22 0230   12/28/22 0230  Fibrinogen  Once,   R        12/28/22 0230   12/28/22 0150  Blood gas, arterial  Once,   R        12/28/22 0149            Vitals/Pain Today's Vitals   12/28/22 0600 12/28/22 0723 12/28/22 0746 12/28/22 1053  BP: (!) 140/90  (!) 130/94 125/87  Pulse: 94  98 92  Resp: 18  16 20   Temp:  98.4 F (36.9 C) 97.9 F (36.6 C)  TempSrc:   Oral Oral  SpO2: 99%  100% 99%  Weight:      Height:      PainSc:  0-No pain 0-No pain     Isolation Precautions No active isolations  Medications Medications  metroNIDAZOLE (FLAGYL) IVPB 500 mg (0 mg Intravenous Stopped 12/28/22 1256)  docusate sodium (COLACE) capsule 100 mg (100 mg Oral Not Given 12/28/22 1121)  polyethylene glycol (MIRALAX / GLYCOLAX) packet 17 g (has no administration in time range)  insulin aspart (novoLOG) injection 0-9 Units (1 Units Subcutaneous Given 12/28/22 1303)  ceFEPIme (MAXIPIME) 2 g in sodium chloride 0.9 % 100 mL IVPB (0 g Intravenous Stopped 12/28/22 1348)  vancomycin (VANCOREADY) IVPB 1500 mg/300 mL (has no administration in time range)  lactated ringers infusion ( Intravenous New Bag/Given 12/28/22 0821)  potassium chloride (KLOR-CON M) CR tablet 30 mEq (30 mEq Oral Not Given 12/28/22 1121)  ceFEPIme (MAXIPIME) 2 g in sodium chloride 0.9 % 100 mL IVPB (0 g Intravenous Stopped 12/27/22 2042)  metroNIDAZOLE (FLAGYL) IVPB 500 mg (0 mg Intravenous Stopped 12/27/22 2156)  lactated ringers bolus 2,238 mL (0 mLs Intravenous Stopped 12/27/22 2319)  vancomycin (VANCOREADY) IVPB 2000 mg/400 mL (0 mg Intravenous Stopped 12/27/22 2319)  iohexol (OMNIPAQUE) 350 MG/ML injection 75 mL (75 mLs Intravenous  Contrast Given 12/27/22 2101)  lactated ringers bolus 1,000 mL (0 mLs Intravenous Stopped 12/28/22 0446)    Mobility walks with device     Focused Assessments Cardiac Assessment Handoff:    Lab Results  Component Value Date   CKTOTAL 132 10/29/2021   Lab Results  Component Value Date   DDIMER 0.83 (H) 03/26/2021   Does the Patient currently have chest pain? No   , Neuro Assessment Handoff:  Swallow screen pass? No          Neuro Assessment: Exceptions to [facility] Neuro Checks:      Has TPA been given? No If patient is a Neuro Trauma and patient is going to OR before floor call report to 4N Charge nurse: [phone] or [phone]   R Recommendations: See Admitting Provider Note  Report given to:   Additional Notes: see admission orders

## 2022-12-28 NOTE — ED Notes (Signed)
PT has a strong yeasty urine smell

## 2022-12-28 NOTE — ED Notes (Signed)
RN messaged provider to clarify current necessity for order for indwelling foley catheter. Pt currently wearing condom catheter, urinating by self, and output being measured.

## 2022-12-29 ENCOUNTER — Inpatient Hospital Stay (HOSPITAL_COMMUNITY): Payer: 59

## 2022-12-29 DIAGNOSIS — A419 Sepsis, unspecified organism: Secondary | ICD-10-CM | POA: Diagnosis not present

## 2022-12-29 DIAGNOSIS — R6520 Severe sepsis without septic shock: Secondary | ICD-10-CM | POA: Diagnosis not present

## 2022-12-29 LAB — COMPREHENSIVE METABOLIC PANEL
ALT: 74 U/L — ABNORMAL HIGH (ref 0–44)
AST: 72 U/L — ABNORMAL HIGH (ref 15–41)
Albumin: 2.2 g/dL — ABNORMAL LOW (ref 3.5–5.0)
Alkaline Phosphatase: 202 U/L — ABNORMAL HIGH (ref 38–126)
Anion gap: 11 (ref 5–15)
BUN: 26 mg/dL — ABNORMAL HIGH (ref 8–23)
CO2: 21 mmol/L — ABNORMAL LOW (ref 22–32)
Calcium: 8.2 mg/dL — ABNORMAL LOW (ref 8.9–10.3)
Chloride: 106 mmol/L (ref 98–111)
Creatinine, Ser: 1.1 mg/dL (ref 0.61–1.24)
GFR, Estimated: >60 mL/min (ref >60)
Glucose, Bld: 131 mg/dL — ABNORMAL HIGH (ref 70–99)
Potassium: 3.2 mmol/L — ABNORMAL LOW (ref 3.5–5.1)
Sodium: 138 mmol/L (ref 135–145)
Total Bilirubin: 1.5 mg/dL — ABNORMAL HIGH (ref 0.3–1.2)
Total Protein: 5.6 g/dL — ABNORMAL LOW (ref 6.5–8.1)

## 2022-12-29 LAB — GLUCOSE, CAPILLARY
Glucose-Capillary: 115 mg/dL — ABNORMAL HIGH (ref 70–99)
Glucose-Capillary: 107 mg/dL — ABNORMAL HIGH (ref 70–99)
Glucose-Capillary: 169 mg/dL — ABNORMAL HIGH (ref 70–99)
Glucose-Capillary: 170 mg/dL — ABNORMAL HIGH (ref 70–99)
Glucose-Capillary: 110 mg/dL — ABNORMAL HIGH (ref 70–99)

## 2022-12-29 LAB — GASTROINTESTINAL PANEL BY PCR, STOOL (REPLACES STOOL CULTURE)

## 2022-12-29 LAB — CBC
HCT: 49.1 % (ref 39.0–52.0)
Hemoglobin: 16.1 g/dL (ref 13.0–17.0)
MCH: 27.6 pg (ref 26.0–34.0)
MCHC: 32.8 g/dL (ref 30.0–36.0)
MCV: 84.1 fL (ref 80.0–100.0)
Platelets: 124 10*3/uL — ABNORMAL LOW (ref 150–400)
RBC: 5.84 MIL/uL — ABNORMAL HIGH (ref 4.22–5.81)
RDW: 17.6 % — ABNORMAL HIGH (ref 11.5–15.5)
WBC: 9.4 10*3/uL (ref 4.0–10.5)
nRBC: 0 % (ref 0.0–0.2)

## 2022-12-29 LAB — MAGNESIUM: Magnesium: 1.5 mg/dL — ABNORMAL LOW (ref 1.7–2.4)

## 2022-12-29 LAB — LACTIC ACID, PLASMA: Lactic Acid, Venous: 1 mmol/L (ref 0.5–1.9)

## 2022-12-29 MED ORDER — VANCOMYCIN HCL 1500 MG/300ML IV SOLN
1500.0000 mg | INTRAVENOUS | Status: DC
  Administered 2022-12-29: 1500 mg via INTRAVENOUS
  Filled 2022-12-29: qty 300

## 2022-12-29 MED ORDER — POTASSIUM CHLORIDE 20 MEQ PO PACK
40.0000 meq | PACK | ORAL | Status: AC
  Administered 2022-12-29: 40 meq via ORAL
  Filled 2022-12-29: qty 2

## 2022-12-29 MED ORDER — MAGNESIUM SULFATE 2 GM/50ML IV SOLN
2.0000 g | INTRAVENOUS | Status: AC
  Administered 2022-12-29: 2 g via INTRAVENOUS
  Filled 2022-12-29: qty 50

## 2022-12-29 MED ORDER — OXYCODONE-ACETAMINOPHEN 5-325 MG PO TABS
1.0000 | ORAL_TABLET | Freq: Four times a day (QID) | ORAL | Status: DC | PRN
  Administered 2022-12-29 – 2023-01-01 (×3): 1 via ORAL
  Filled 2022-12-29 (×3): qty 1

## 2022-12-29 MED ORDER — INSULIN ASPART 100 UNIT/ML IJ SOLN
0.0000 [IU] | Freq: Three times a day (TID) | INTRAMUSCULAR | Status: DC
  Administered 2022-12-29 (×2): 2 [IU] via SUBCUTANEOUS
  Administered 2022-12-30 – 2023-01-01 (×4): 1 [IU] via SUBCUTANEOUS

## 2022-12-29 MED ORDER — INSULIN ASPART 100 UNIT/ML IJ SOLN: 0.0000 [IU] | Freq: Every day | INTRAMUSCULAR | Status: DC

## 2022-12-29 NOTE — Progress Notes (Signed)
PROGRESS NOTE  [name] VWU:[id number] DOB: Mar 26, 1955 DOA: 12/27/2022 PCP: Pcp, No   LOS: 2 days   Brief Narrative / Interim history: 67 y.o. male w/ PMH HTN, HLD, IDDM, comes into the hospital with confusion, feeling sick to his stomach.  He tells me that he has been sick with abdominal discomfort and nausea for the past 3 days.  He also reports dysuria for the past 4 days.  He denies any fever or chills at home.  He tells me that he lives by himself, and he had a friend help him come to the hospital  Subjective / 24h Interval events: Nausea seems to be better but still complains of lower midline abdominal discomfort.  Assesement and Plan: Principal problem Severe sepsis due to presumed UTI -urinalysis with pyuria, he also reported dysuria for the past several days.  For now he was placed on broad-spectrum antibiotics, continue -Urine cultures appears to grow something, reintubated for better growth.  Blood cultures have remained negative.  Active problems Diarrhea-C. difficile negative, GI pathogen panel pending  Acute metabolic encephalopathy-still with some confusion but more alert and aware today  Anion gap metabolic acidosis-due to lactic acidosis, continue fluids.  ABG reassuring with normal pH.  Lactic acid is improved  Hypokalemia, hypomagnesemia-replenish and continue to monitor  LFT elevation -mild, likely in the setting of severe sepsis.  Right upper quadrant ultrasound unremarkable  CKD 3 AA -creatinine currently appears at baseline  Thrombocytopenia -due to acute illness  DM 2 -add mealtime sliding scale  Lab Results  Component Value Date   HGBA1C 7.0 (H) 12/28/2022   CBG (last 3)  Recent Labs    12/28/22 2311 12/29/22 0423 12/29/22 0744  GLUCAP 181* 115* 107*    Essential hypertension -hold home agents due to severe sepsis.  He is normotensive today  CAD, prior non-STEMI-continue aspirin and Plavix  Hyperlipidemia-continue statin  Scheduled  Meds:  aspirin EC  81 mg Oral Daily   atorvastatin  40 mg Oral QHS   clopidogrel  75 mg Oral Daily   divalproex  250 mg Oral BID   docusate sodium  100 mg Oral BID   finasteride  5 mg Oral Daily   insulin aspart  0-9 Units Subcutaneous Q4H   memantine  10 mg Oral QHS   metoprolol succinate  25 mg Oral Daily   OLANZapine  2.5 mg Oral BID   potassium chloride  30 mEq Oral Once   sertraline  50 mg Oral Daily   Continuous Infusions:  ceFEPime (MAXIPIME) IV Stopped (12/29/22 0510)   metronidazole 500 mg (12/29/22 0940)   vancomycin     PRN Meds:.hydrALAZINE, LORazepam, polyethylene glycol  Current Outpatient Medications  Medication Instructions   amLODipine (NORVASC) 5 mg, Oral, Daily   aspirin EC 81 mg, Oral, Daily, Swallow whole.   atorvastatin (LIPITOR) 40 mg, Oral, Daily at bedtime   clopidogrel (PLAVIX) 75 mg, Oral, Daily   Colchicine 0.6 mg, Oral, Every 12 hours   divalproex (DEPAKOTE ER) 250 mg, Oral, 2 times daily   Farxiga 10 mg, Oral, Daily   finasteride (PROSCAR) 5 mg, Oral, Daily   HYDROcodone-acetaminophen (NORCO/VICODIN) 5-325 MG tablet 1-2 tablets, Oral, Every 6 hours PRN   insulin lispro (HUMALOG) 4 Units, Subcutaneous, 3 times daily before meals   Lantus SoloStar 20 Units, Subcutaneous, Daily   LORazepam (ATIVAN) 0.5 mg, Oral, Every 8 hours PRN   memantine (NAMENDA) 10 mg, Oral, Daily at bedtime   metoprolol succinate (TOPROL-XL) 25 mg, Oral,  Daily   nystatin (MYCOSTATIN/NYSTOP) powder 1 Application, Topical, 3 times daily, To groin   OLANZapine (ZYPREXA) 2.5 mg, Oral, 2 times daily   sertraline (ZOLOFT) 50 mg, Oral, Daily   Zinc Oxide (DESITIN EX) 1 application , Topical, As needed    Diet Orders (From admission, onward)     Start     Ordered   12/28/22 1020  Diet full liquid Room service appropriate? No; Fluid consistency: Thin  Diet effective now       Question Answer Comment  Room service appropriate? No   Fluid consistency: Thin      12/28/22 1020             DVT prophylaxis: SCDs Start: 12/27/22 2254   Lab Results  Component Value Date   PLT 124 (L) 12/29/2022      Code Status: Full Code  Family Communication: no family at bedside   Status is: Inpatient Remains inpatient appropriate because: severity of illness  Level of care: Progressive  Consultants:  none  Objective: Vitals:   12/28/22 2313 12/29/22 0000 12/29/22 0426 12/29/22 0743  BP:  111/74 126/85 113/72  Pulse:  78 (!) 54 (!) 55  Resp:  19 14 16   Temp: 97.9 F (36.6 C)  97.7 F (36.5 C) 97.6 F (36.4 C)  TempSrc: Oral  Axillary Axillary  SpO2:  94% 99% 96%  Weight:      Height:        Intake/Output Summary (Last 24 hours) at 12/29/2022 1029 Last data filed at 12/29/2022 1610 Gross per 24 hour  Intake 2171.08 ml  Output --  Net 2171.08 ml   Wt Readings from Last 3 Encounters:  12/27/22 90.7 kg  12/14/22 90.7 kg  02/28/22 75.8 kg    Examination:  Constitutional: NAD Eyes: lids and conjunctivae normal, no scleral icterus ENMT: mmm Neck: normal, supple Respiratory: clear to auscultation bilaterally, no wheezing, no crackles. Normal respiratory effort.  Cardiovascular: Regular rate and rhythm, no murmurs / rubs / gallops. No LE edema. Abdomen: soft, no distention, no tenderness. Bowel sounds positive.  Some tenderness present in the suprapubic area  Data Reviewed: I have independently reviewed following labs and imaging studies   CBC Recent Labs  Lab 12/27/22 1930 12/28/22 0055 12/28/22 0446 12/29/22 0235  WBC 19.3* 14.4*  --  9.4  HGB 18.5* 14.5 15.3 16.1  HCT 55.8* 44.8 45.0 49.1  PLT 237 159  --  124*  MCV 84.5 86.2  --  84.1  MCH 28.0 27.9  --  27.6  MCHC 33.2 32.4  --  32.8  RDW 18.2* 17.0*  --  17.6*  LYMPHSABS 0.7  --   --   --   MONOABS 1.4*  --   --   --   EOSABS 0.0  --   --   --   BASOSABS 0.1  --   --   --     Recent Labs  Lab 12/27/22 1930 12/27/22 1941 12/27/22 2153 12/27/22 2216 12/28/22 0040  12/28/22 0052 12/28/22 0055 12/28/22 0446 12/28/22 0521 12/28/22 1804 12/29/22 0235 12/29/22 0727  NA 141  --   --   --   --   --  136 139  --   --  138  --   K 4.2  --   --   --   --   --  3.8 3.2*  --   --  3.2*  --   CL 104  --   --   --   --   --  106  --   --   --  106  --   CO2 18*  --   --   --   --   --  16*  --   --   --  21*  --   GLUCOSE 117*  --   --   --   --   --  121*  --   --   --  131*  --   BUN 34*  --   --   --   --   --  31*  --   --   --  26*  --   CREATININE 1.50*  --   --   --   --   --  1.25*  --   --   --  1.10  --   CALCIUM 8.2*  --   --   --   --   --  7.8*  --   --   --  8.2*  --   AST 108*  --   --   --   --   --  76*  --   --   --  72*  --   ALT 102*  --   --   --   --   --  75*  --   --   --  74*  --   ALKPHOS 290*  --   --   --   --   --  198*  --   --   --  202*  --   BILITOT 2.1*  --   --   --   --   --  1.2  --   --   --  1.5*  --   ALBUMIN 2.6*  --   --   --   --   --  1.9*  --   --   --  2.2*  --   MG  --   --   --   --   --   --   --   --   --   --  1.5*  --   LATICACIDVEN  --    < >  --  >15.0*  --  5.0*  --   --  7.4* 2.3*  --  1.0  INR  --   --  9.4*  --   --   --   --   --   --   --   --   --   TSH 1.856  --   --   --   --   --   --   --   --   --   --   --   HGBA1C  --   --   --   --   --   --  7.0*  --   --   --   --   --   AMMONIA  --   --   --   --  32  --   --   --   --   --   --   --    < > = values in this interval not displayed.    ------------------------------------------------------------------------------------------------------------------ No results for input(s): "CHOL", "HDL", "LDLCALC", "TRIG", "CHOLHDL", "LDLDIRECT" in the last 72 hours.  Lab Results  Component Value Date   HGBA1C 7.0 (H) 12/28/2022   ------------------------------------------------------------------------------------------------------------------ Recent Labs    12/27/22 1930  TSH 1.856  Cardiac Enzymes No results for input(s): "CKMB",  "TROPONINI", "MYOGLOBIN" in the last 168 hours.  Invalid input(s): "CK" ------------------------------------------------------------------------------------------------------------------    Component Value Date/Time   BNP 16.2 02/09/2021 1407    CBG: Recent Labs  Lab 12/28/22 1254 12/28/22 2026 12/28/22 2311 12/29/22 0423 12/29/22 0744  GLUCAP 125* 205* 181* 115* 107*    Recent Results (from the past 240 hour(s))  Resp panel by RT-PCR (RSV, Flu A&B, Covid) Anterior Nasal Swab     Status: None   Collection Time: 12/27/22  6:41 PM   Specimen: Anterior Nasal Swab  Result Value Ref Range Status   SARS Coronavirus 2 by RT PCR NEGATIVE NEGATIVE Final   Influenza A by PCR NEGATIVE NEGATIVE Final   Influenza B by PCR NEGATIVE NEGATIVE Final    Comment: (NOTE) The Xpert Xpress SARS-CoV-2/FLU/RSV plus assay is intended as an aid in the diagnosis of influenza from Nasopharyngeal swab specimens and should not be used as a sole basis for treatment. Nasal washings and aspirates are unacceptable for Xpert Xpress SARS-CoV-2/FLU/RSV testing.  Fact Sheet for Patients: BloggerCourse.com  Fact Sheet for Healthcare Providers: SeriousBroker.it  This test is not yet approved or cleared by the [place] FDA and has been authorized for detection and/or diagnosis of SARS-CoV-2 by FDA under an Emergency Use Authorization (EUA). This EUA will remain in effect (meaning this test can be used) for the duration of the COVID-19 declaration under Section 564(b)(1) of the Act, 21 U.S.C. section 360bbb-3(b)(1), unless the authorization is terminated or revoked.     Resp Syncytial Virus by PCR NEGATIVE NEGATIVE Final    Comment: (NOTE) Fact Sheet for Patients: BloggerCourse.com  Fact Sheet for Healthcare Providers: SeriousBroker.it  This test is not yet approved or cleared by the [place]  FDA and has been authorized for detection and/or diagnosis of SARS-CoV-2 by FDA under an Emergency Use Authorization (EUA). This EUA will remain in effect (meaning this test can be used) for the duration of the COVID-19 declaration under Section 564(b)(1) of the Act, 21 U.S.C. section 360bbb-3(b)(1), unless the authorization is terminated or revoked.  Performed at [facility], [address], [place], [place] [address]   Blood Culture (routine x 2)     Status: None (Preliminary result)   Collection Time: 12/27/22  7:50 PM   Specimen: BLOOD  Result Value Ref Range Status   Specimen Description BLOOD RIGHT ANTECUBITAL  Final   Special Requests   Final    BOTTLES DRAWN AEROBIC AND ANAEROBIC Blood Culture adequate volume   Culture   Final    NO GROWTH 2 DAYS Performed at [facility], [address], [place], [place] [address]    Report Status PENDING  Incomplete  Blood Culture (routine x 2)     Status: None (Preliminary result)   Collection Time: 12/27/22  7:50 PM   Specimen: BLOOD  Result Value Ref Range Status   Specimen Description BLOOD RIGHT ANTECUBITAL  Final   Special Requests   Final    BOTTLES DRAWN AEROBIC AND ANAEROBIC Blood Culture results may not be optimal due to an inadequate volume of blood received in culture bottles   Culture   Final    NO GROWTH 2 DAYS Performed at [facility], [address] [place], [place] [address]    Report Status PENDING  Incomplete  Urine Culture     Status: None (Preliminary result)   Collection Time: 12/28/22  3:48 AM   Specimen: Urine,  Random  Result Value Ref Range Status   Specimen Description URINE, RANDOM  Final   Special Requests NONE Reflexed from [id number]  Final   Culture   Final    CULTURE REINCUBATED FOR BETTER GROWTH Performed at [facility], [address] [place], [place] [address]    Report Status PENDING  Incomplete  C Difficile Quick Screen w PCR reflex     Status: None   Collection  Time: 12/28/22  4:07 PM   Specimen: STOOL  Result Value Ref Range Status   C Diff antigen NEGATIVE NEGATIVE Final   C Diff toxin NEGATIVE NEGATIVE Final   C Diff interpretation No C. difficile detected.  Final    Comment: Performed at [facility], [address], [place], [place] [address]     Radiology Studies: US Abdomen Limited RUQ (LIVER/GB)  Result Date: 12/29/2022 CLINICAL DATA:  Elevated liver function tests EXAM: ULTRASOUND ABDOMEN LIMITED RIGHT UPPER QUADRANT COMPARISON:  CT abdomen pelvis with contrast 12/27/2022 FINDINGS: Gallbladder: Gallstones: None Sludge: Present Gallbladder Wall: Within normal limits Pericholecystic fluid: None Sonographic Murphy's Sign: Negative per technologist Common bile duct: Diameter: 4 mm Liver: Parenchymal echogenicity: Within normal limits Contours: Normal Lesions: None Portal vein: Patent.  Hepatopetal flow Other: Unchanged severe right hydronephrosis. IMPRESSION: 1. No acute abnormality of the liver or gallbladder. 2. Mild gallbladder sludge without additional sonographic evidence of acute cholecystitis. Electronically Signed   By: Acquanetta Belling M.D.   On: 12/29/2022 09:31     Pamella Pert, MD, PhD [facility]  Between 7 am - 7 pm I am available, please contact me via Amion (for emergencies) or Securechat (non urgent messages)  Between 7 pm - 7 am I am not available, please contact night coverage MD/APP via Amion

## 2022-12-29 NOTE — Progress Notes (Signed)
Hypokalemia Hypomagnesemia Patient's nurse informing labs showing low potassium 3.5 and low mag 1.5 - Replating both potassium and mag.  Tereasa Coop, MD [facility] 12/29/2022, 4:06 AM

## 2022-12-29 NOTE — Plan of Care (Signed)
Pt complained of pain, notified attending. PRN order placed and pt is resting at this time. VSS on RA, no distress noted.

## 2022-12-29 NOTE — Plan of Care (Signed)
  Problem: Fluid Volume: Goal: Hemodynamic stability will improve Outcome: Progressing   Problem: Clinical Measurements: Goal: Diagnostic test results will improve Outcome: Progressing Goal: Signs and symptoms of infection will decrease Outcome: Progressing   Problem: Respiratory: Goal: Ability to maintain adequate ventilation will improve Outcome: Progressing   Problem: Fluid Volume: Goal: Ability to maintain a balanced intake and output will improve Outcome: Progressing

## 2022-12-29 NOTE — Plan of Care (Signed)
Problem: Fluid Volume: Goal: Hemodynamic stability will improve Outcome: Progressing   Problem: Clinical Measurements: Goal: Diagnostic test results will improve Outcome: Progressing Goal: Signs and symptoms of infection will decrease Outcome: Progressing

## 2022-12-30 DIAGNOSIS — R6520 Severe sepsis without septic shock: Secondary | ICD-10-CM | POA: Diagnosis not present

## 2022-12-30 DIAGNOSIS — A419 Sepsis, unspecified organism: Secondary | ICD-10-CM | POA: Diagnosis not present

## 2022-12-30 LAB — CBC
HCT: 47.5 % (ref 39.0–52.0)
Hemoglobin: 16 g/dL (ref 13.0–17.0)
MCH: 28.9 pg (ref 26.0–34.0)
MCHC: 33.7 g/dL (ref 30.0–36.0)
MCV: 85.7 fL (ref 80.0–100.0)
Platelets: 152 10*3/uL (ref 150–400)
RBC: 5.54 MIL/uL (ref 4.22–5.81)
RDW: 17.2 % — ABNORMAL HIGH (ref 11.5–15.5)
WBC: 11.6 10*3/uL — ABNORMAL HIGH (ref 4.0–10.5)
nRBC: 0 % (ref 0.0–0.2)

## 2022-12-30 LAB — COMPREHENSIVE METABOLIC PANEL
ALT: 65 U/L — ABNORMAL HIGH (ref 0–44)
AST: 57 U/L — ABNORMAL HIGH (ref 15–41)
Albumin: 2.5 g/dL — ABNORMAL LOW (ref 3.5–5.0)
Alkaline Phosphatase: 207 U/L — ABNORMAL HIGH (ref 38–126)
Anion gap: 12 (ref 5–15)
BUN: 18 mg/dL (ref 8–23)
CO2: 20 mmol/L — ABNORMAL LOW (ref 22–32)
Calcium: 8.5 mg/dL — ABNORMAL LOW (ref 8.9–10.3)
Chloride: 110 mmol/L (ref 98–111)
Creatinine, Ser: 0.94 mg/dL (ref 0.61–1.24)
GFR, Estimated: >60 mL/min (ref >60)
Glucose, Bld: 98 mg/dL (ref 70–99)
Potassium: 3 mmol/L — ABNORMAL LOW (ref 3.5–5.1)
Sodium: 142 mmol/L (ref 135–145)
Total Bilirubin: 1 mg/dL (ref 0.3–1.2)
Total Protein: 6.1 g/dL — ABNORMAL LOW (ref 6.5–8.1)

## 2022-12-30 LAB — GLUCOSE, CAPILLARY
Glucose-Capillary: 187 mg/dL — ABNORMAL HIGH (ref 70–99)
Glucose-Capillary: 136 mg/dL — ABNORMAL HIGH (ref 70–99)
Glucose-Capillary: 114 mg/dL — ABNORMAL HIGH (ref 70–99)
Glucose-Capillary: 111 mg/dL — ABNORMAL HIGH (ref 70–99)
Glucose-Capillary: 114 mg/dL — ABNORMAL HIGH (ref 70–99)

## 2022-12-30 LAB — URINE CULTURE: Culture: >=100000 — AB

## 2022-12-30 LAB — MAGNESIUM: Magnesium: 1.9 mg/dL (ref 1.7–2.4)

## 2022-12-30 LAB — MRSA NEXT GEN BY PCR, NASAL: MRSA by PCR Next Gen: DETECTED — AB

## 2022-12-30 MED ORDER — MUPIROCIN 2 % EX OINT
1.0000 | TOPICAL_OINTMENT | Freq: Two times a day (BID) | CUTANEOUS | Status: DC
  Administered 2022-12-30 – 2023-01-02 (×7): 1 via NASAL
  Filled 2022-12-30 (×2): qty 22

## 2022-12-30 MED ORDER — ONDANSETRON HCL 4 MG/2ML IJ SOLN
4.0000 mg | Freq: Four times a day (QID) | INTRAMUSCULAR | Status: DC | PRN
  Administered 2022-12-30: 4 mg via INTRAVENOUS
  Filled 2022-12-30: qty 2

## 2022-12-30 MED ORDER — POTASSIUM CHLORIDE CRYS ER 20 MEQ PO TBCR: 40.0000 meq | EXTENDED_RELEASE_TABLET | Freq: Once | ORAL | Status: DC

## 2022-12-30 MED ORDER — CHLORHEXIDINE GLUCONATE CLOTH 2 % EX PADS
6.0000 | MEDICATED_PAD | Freq: Every day | CUTANEOUS | Status: DC
  Administered 2023-01-01 – 2023-01-02 (×2): 6 via TOPICAL

## 2022-12-30 MED ORDER — CHLORHEXIDINE GLUCONATE CLOTH 2 % EX PADS
6.0000 | MEDICATED_PAD | Freq: Every day | CUTANEOUS | Status: DC
  Administered 2022-12-31 – 2023-01-01 (×2): 6 via TOPICAL

## 2022-12-30 MED ORDER — POTASSIUM CHLORIDE CRYS ER 20 MEQ PO TBCR
40.0000 meq | EXTENDED_RELEASE_TABLET | ORAL | Status: AC
  Administered 2022-12-30: 40 meq via ORAL
  Filled 2022-12-30: qty 2

## 2022-12-30 MED ORDER — CARMEX CLASSIC LIP BALM EX OINT
TOPICAL_OINTMENT | CUTANEOUS | Status: DC | PRN
  Administered 2022-12-30: 1 via TOPICAL
  Filled 2022-12-30: qty 10

## 2022-12-30 MED ORDER — LINEZOLID 600 MG PO TABS
600.0000 mg | ORAL_TABLET | Freq: Two times a day (BID) | ORAL | Status: DC
  Administered 2022-12-30 – 2023-01-02 (×7): 600 mg via ORAL
  Filled 2022-12-30 (×9): qty 1

## 2022-12-30 MED ORDER — POTASSIUM CHLORIDE 20 MEQ PO PACK: 40.0000 meq | PACK | ORAL | Status: DC

## 2022-12-30 NOTE — [place] FL2 (Signed)
[place] MEDICAID FL2 LEVEL OF CARE FORM     IDENTIFICATION  Patient Name: [name] Birthdate: 1955/10/19 Sex: male Admission Date (Current Location): 12/27/2022  [facility] and [place] Number:  Producer, television/film/video and Address:  [facility] [place] [place], [address] [place], [place] [address]      Provider Number: [id number]  Attending Physician Name and Address:  Leatha Gilding, MD  Relative Name and Phone Number:       Current Level of Care: Hospital Recommended Level of Care: Skilled Nursing Facility Prior Approval Number:    Date Approved/Denied:   PASRR Number: [id number]  Discharge Plan: SNF    Current Diagnoses: Patient Active Problem List   Diagnosis Date Noted   Severe sepsis w/ septic shock POA evidenced by encephalopathy, significant lactic acid elevation 12/27/2022   Sepsis (HCC) 12/27/2022   Acute subdural hematoma (HCC) 01/13/2022   CAD S/P percutaneous coronary angioplasty 01/13/2022   Left elbow pain 01/13/2022   NSTEMI (non-ST elevated myocardial infarction) (HCC) 10/29/2021   History of prostate cancer with recurrent urinary obstruction  03/24/2021   HLD (hyperlipidemia) 01/05/2021   Acute urinary retention 10/25/2020   Adjustment disorder with mixed anxiety and depressed mood    Ataxia 08/01/2020   Hydroureteronephrosis 02/10/2020   Sinus tachycardia 09/28/2019   Insomnia 08/26/2018   Nephropathy 03/26/2018   Urinary urgency 03/26/2018   Type 2 diabetes mellitus with diabetic nephropathy, without long-term current use of insulin (HCC) 03/26/2018   Essential hypertension 03/26/2018   Chronic kidney disease (CKD), stage III (moderate) (HCC) 03/26/2018   Conductive hearing loss, bilateral 11/07/2017   Microcytic anemia 02/17/2012   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   Depressive disorder 02/17/2012    Orientation RESPIRATION BLADDER Height & Weight     Self, Place  Normal Incontinent, Indwelling catheter Weight: 200  lb (90.7 kg) Height:  5\' 6"  (167.6 cm)  BEHAVIORAL SYMPTOMS/MOOD NEUROLOGICAL BOWEL NUTRITION STATUS      Incontinent Diet (see dc summary)  AMBULATORY STATUS COMMUNICATION OF NEEDS Skin   Extensive Assist Verbally Normal                       Personal Care Assistance Level of Assistance  Bathing, Feeding, Dressing Bathing Assistance: Maximum assistance Feeding assistance: Maximum assistance Dressing Assistance: Maximum assistance     Functional Limitations Info             SPECIAL CARE FACTORS FREQUENCY                       Contractures Contractures Info: Not present    Additional Factors Info  Code Status, Allergies, Insulin Sliding Scale Code Status Info: Full Allergies Info: Haldol (Haloperidol)   Insulin Sliding Scale Info: See dc summary       Current Medications (12/30/2022):  This is the current hospital active medication list Current Facility-Administered Medications  Medication Dose Route Frequency Provider Last Rate Last Admin   aspirin EC tablet 81 mg  81 mg Oral Daily Leatha Gilding, MD   81 mg at 12/30/22 0952   atorvastatin (LIPITOR) tablet 40 mg  40 mg Oral QHS Leatha Gilding, MD   40 mg at 12/29/22 2141   clopidogrel (PLAVIX) tablet 75 mg  75 mg Oral Daily Leatha Gilding, MD   75 mg at 12/30/22 0953   divalproex (DEPAKOTE ER) 24 hr tablet 250 mg  250 mg Oral BID Leatha Gilding, MD  250 mg at 12/30/22 0953   docusate sodium (COLACE) capsule 100 mg  100 mg Oral BID Sunnie Nielsen, DO   100 mg at 12/30/22 9528   finasteride (PROSCAR) tablet 5 mg  5 mg Oral Daily Leatha Gilding, MD   5 mg at 12/30/22 4132   hydrALAZINE (APRESOLINE) injection 10 mg  10 mg Intravenous Q8H PRN Sundil, Subrina, MD       insulin aspart (novoLOG) injection 0-5 Units  0-5 Units Subcutaneous QHS Gherghe, Costin M, MD       insulin aspart (novoLOG) injection 0-9 Units  0-9 Units Subcutaneous TID WC Leatha Gilding, MD   1 Units at 12/30/22 0703    linezolid (ZYVOX) tablet 600 mg  600 mg Oral Q12H Leatha Gilding, MD       LORazepam (ATIVAN) tablet 0.25 mg  0.25 mg Oral BID PRN Leatha Gilding, MD   0.25 mg at 12/28/22 2037   memantine (NAMENDA) tablet 10 mg  10 mg Oral QHS Leatha Gilding, MD   10 mg at 12/29/22 2141   metoprolol succinate (TOPROL-XL) 24 hr tablet 25 mg  25 mg Oral Daily Leatha Gilding, MD   25 mg at 12/30/22 0953   OLANZapine (ZYPREXA) tablet 2.5 mg  2.5 mg Oral BID Leatha Gilding, MD   2.5 mg at 12/30/22 0953   oxyCODONE-acetaminophen (PERCOCET/ROXICET) 5-325 MG per tablet 1 tablet  1 tablet Oral Q6H PRN Leatha Gilding, MD   1 tablet at 12/29/22 1541   polyethylene glycol (MIRALAX / GLYCOLAX) packet 17 g  17 g Oral Daily PRN Sunnie Nielsen, DO       potassium chloride (KLOR-CON M) CR tablet 30 mEq  30 mEq Oral Once Leatha Gilding, MD         Discharge Medications: Please see discharge summary for a list of discharge medications.  Relevant Imaging Results:  Relevant Lab Results:   Additional Information [id number]  Mearl Latin, LCSW

## 2022-12-30 NOTE — Progress Notes (Signed)
  Patient complaining about nausea. - Ordering Zofran as needed.   Tereasa Coop, MD [facility] 12/30/2022, 7:50 PM

## 2022-12-30 NOTE — Progress Notes (Signed)
Patient has been retaining urine and unable to void the last few hours through the external catheter. The bladder scan showed an amount greater than 500 ml. A Foley catheter was placed per orders with a subsequent instant output of 1100 ml.

## 2022-12-30 NOTE — TOC Initial Note (Signed)
Transition of Care [facility]) - Initial/Assessment Note    Patient Details  Name: [name] MRN: [id number] Date of Birth: [birth year]  Transition of Care [facility]) CM/SW Contact:    Mearl Latin, LCSW Phone Number: 12/30/2022, 4:59 PM  Clinical Narrative:                 Patient was admitted from [facility] long term care SNF. CSW notified facility of patient likely being ready for discharge tomorrow. CSW attempted to call patient's sister but line keeps ringing and no voicemail picks up. Will continue to follow and arrange discharge when appropriate.   Expected Discharge Plan: Skilled Nursing Facility Barriers to Discharge: Continued Medical Work up   Patient Goals and CMS Choice Patient states their goals for this hospitalization and ongoing recovery are:: Return to snf          Expected Discharge Plan and Services In-house Referral: Clinical Social Work   Post Acute Care Choice: Skilled Nursing Facility Living arrangements for the past 2 months: Skilled Nursing Facility                                      Prior Living Arrangements/Services Living arrangements for the past 2 months: Skilled Nursing Facility Lives with:: Facility Resident Patient language and need for interpreter reviewed:: Yes Do you feel safe going back to the place where you live?: Yes      Need for Family Participation in Patient Care: No (Comment) Care giver support system in place?: No (comment)   Criminal Activity/Legal Involvement Pertinent to Current Situation/Hospitalization: No - Comment as needed  Activities of Daily Living   ADL Screening (condition at time of admission) Independently performs ADLs?: No Is the patient deaf or have difficulty hearing?: No Does the patient have difficulty seeing, even when wearing glasses/contacts?: No  Permission Sought/Granted Permission sought to share information with : Facility Medical sales representative, Family Supports Permission granted to  share information with : No  Share Information with NAME: [name]  Permission granted to share info w AGENCY: [employer] granted to share info w Relationship: Sister  Permission granted to share info w Contact Information: [phone]  Emotional Assessment Appearance:: Appears stated age Attitude/Demeanor/Rapport: Unable to Assess Affect (typically observed): Unable to Assess Orientation: : Oriented to Self, Oriented to Place Alcohol / Substance Use: Not Applicable Psych Involvement: No (comment)  Admission diagnosis:  Sepsis (HCC) [A41.9] Sepsis, due to unspecified organism, unspecified whether acute organ dysfunction present [facility]) [A41.9] Patient Active Problem List   Diagnosis Date Noted   Severe sepsis w/ septic shock POA evidenced by encephalopathy, significant lactic acid elevation 12/27/2022   Sepsis (HCC) 12/27/2022   Acute subdural hematoma (HCC) 01/13/2022   CAD S/P percutaneous coronary angioplasty 01/13/2022   Left elbow pain 01/13/2022   NSTEMI (non-ST elevated myocardial infarction) (HCC) 10/29/2021   History of prostate cancer with recurrent urinary obstruction  03/24/2021   HLD (hyperlipidemia) 01/05/2021   Acute urinary retention 10/25/2020   Adjustment disorder with mixed anxiety and depressed mood    Ataxia 08/01/2020   Hydroureteronephrosis 02/10/2020   Sinus tachycardia 09/28/2019   Insomnia 08/26/2018   Nephropathy 03/26/2018   Urinary urgency 03/26/2018   Type 2 diabetes mellitus with diabetic nephropathy, without long-term current use of insulin (HCC) 03/26/2018   Essential hypertension 03/26/2018   Chronic kidney disease (CKD), stage III (moderate) (HCC) 03/26/2018   Conductive hearing  loss, bilateral 11/07/2017   Microcytic anemia 02/17/2012   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   Depressive disorder 02/17/2012   PCP:  Oneita Hurt, No Pharmacy:   [facility] - [place], [place] - [address] [place]  [place] [address] Phone: [phone] Fax: [phone]     Social Determinants of Health (SDOH) Social History: SDOH Screenings   Food Insecurity: No Food Insecurity (08/26/2019)  Transportation Needs: No Transportation Needs (08/26/2019)  Depression (PHQ2-9): High Risk (12/06/2020)  Financial Resource Strain: Low Risk  (07/24/2021)   Received from [facility]  Physical Activity: Insufficiently Active (08/26/2019)  Social Connections: Unknown (07/24/2021)   Received from [facility], [facility]  Stress: Stress Concern Present (07/24/2021)   Received from [facility], [facility]  Tobacco Use: Low Risk  (12/27/2022)   SDOH Interventions:     Readmission Risk Interventions    10/31/2021    2:47 PM  Readmission Risk Prevention Plan  Transportation Screening Complete  Medication Review (RN Care Manager) Complete  PCP or Specialist appointment within 3-5 days of discharge Complete  SW Recovery Care/Counseling Consult Complete  Palliative Care Screening Not Applicable  Skilled Nursing Facility Complete

## 2022-12-30 NOTE — Evaluation (Signed)
Clinical/Bedside Swallow Evaluation Patient Details  Name: [name] MRN: [id number] Date of Birth: 12-Dec-1955  Today's Date: 12/30/2022 Time: SLP Start Time (ACUTE ONLY): 1638 SLP Stop Time (ACUTE ONLY): 1653 SLP Time Calculation (min) (ACUTE ONLY): 15 min  Past Medical History:  Past Medical History:  Diagnosis Date   Acute kidney injury superimposed on chronic kidney disease (HCC) 02/10/2020   Acute on chronic kidney failure (HCC) 09/28/2019   Closed nondisplaced spiral fracture of shaft of right tibia    Community acquired pneumonia 03/24/2021   Complicated urinary tract infection 09/06/2016   Depression    Diabetes mellitus without complication (HCC)    GERD (gastroesophageal reflux disease)    Gout    right foot   Hemorrhoids    Hypertension    Macular degeneration    Microcytic anemia    PNA (pneumonia) 03/26/2021   Prostate cancer (HCC) 08/2009   Tubular adenoma    Past Surgical History:  Past Surgical History:  Procedure Laterality Date   CORONARY PRESSURE/FFR STUDY N/A 11/07/2021   Procedure: INTRAVASCULAR PRESSURE WIRE/FFR STUDY;  Surgeon: Orbie Pyo, MD;  Location: [facility] INVASIVE CV LAB;  Service: Cardiovascular;  Laterality: N/A;   CORONARY STENT INTERVENTION N/A 10/29/2021   Procedure: CORONARY STENT INTERVENTION;  Surgeon: Swaziland, Peter M, MD;  Location: [facility] INVASIVE CV LAB;  Service: Cardiovascular;  Laterality: N/A;  Mid LAD   CYSTOSCOPY WITH RETROGRADE PYELOGRAM, URETEROSCOPY AND STENT PLACEMENT Right 07/31/2016   Procedure: CYSTOSCOPY WITH RIGHT  RETROGRADE PYELOGRAM, URETEROSCOPY;  Surgeon: Crist Fat, MD;  Location: [facility] ORS;  Service: Urology;  Laterality: Right;   INGUINAL HERNIA REPAIR     Left ankle joint fusion  1981   LEFT HEART CATH AND CORONARY ANGIOGRAPHY N/A 10/29/2021   Procedure: LEFT HEART CATH AND CORONARY ANGIOGRAPHY;  Surgeon: Swaziland, Peter M, MD;  Location: [facility] INVASIVE CV LAB;  Service: Cardiovascular;  Laterality: N/A;   LEFT  HEART CATH AND CORONARY ANGIOGRAPHY N/A 11/07/2021   Procedure: LEFT HEART CATH AND CORONARY ANGIOGRAPHY;  Surgeon: Orbie Pyo, MD;  Location: [facility] INVASIVE CV LAB;  Service: Cardiovascular;  Laterality: N/A;   PROSTATE BIOPSY     x 2   URETERAL REIMPLANTION  07/31/2016   Procedure: URETERAL REIMPLANT, right boari flap, right psoas hitch;  Surgeon: Crist Fat, MD;  Location: [facility] ORS;  Service: Urology;;   HPI:  Pt is a 67 yo. male with PMH including HTN, HLD, IDDM. Presenting to ED 10/11 with three day history of AMS, abdominal discomfort, and nausea.  He also reports dysuria for the past 4 days. Head CT negative. CXR no active disease. Seen by SLP 12/28/22 with no overt s/s of dysphagia or aspiration but with recommendations to resume full liquid diet given frequent emesis and ability to upgrade per MD discretion. SLP received new orders to evaluate swallowing due to RN reports of frequent coughing with med adminsitration and during meals with desaturations.    Assessment / Plan / Recommendation  Clinical Impression  Per RN, pt began having extensive coughing this date with administration of single pill with thin liquids that continued throughout the day as well as with meals and was subsequently made NPO. Pt reports nausea has subsided and he has had no difficulty with previously recommended diet of full liquids until today. Oral motor exam WFL. Pt presents with no s/s of dysphagia or aspiration given single sips of thin liquids via straw, teaspoons of puree, or solids. When given mixed solids and  consecutive sips of thin liquids, pt had significant bout of coughing lasting minutes with a wet vocal quality. Subsequent single sips of thin liquids after initial cough also resulted in a wet sounding cough and quality of voice. Recommend he remain NPO except for essential meds given crushed in purees. Plan to complete an MBS to further assess swallowing as scheduling allows. Discussed with RN,  who is in agreement. Will continue to f/u. SLP Visit Diagnosis: Dysphagia, unspecified (R13.10)    Aspiration Risk  Mild aspiration risk    Diet Recommendation NPO except meds    Medication Administration: Crushed with puree    Other  Recommendations Oral Care Recommendations: Oral care QID    Recommendations for follow up therapy are one component of a multi-disciplinary discharge planning process, led by the attending physician.  Recommendations may be updated based on patient status, additional functional criteria and insurance authorization.  Follow up Recommendations Skilled nursing-short term rehab (<3 hours/day)      Assistance Recommended at Discharge    Functional Status Assessment Patient has not had a recent decline in their functional status  Frequency and Duration min 2x/week  1 week       Prognosis Prognosis for improved oropharyngeal function: Good Barriers to Reach Goals: Time post onset      Swallow Study   General HPI: Pt is a 67 yo. male with PMH including HTN, HLD, IDDM. Presenting to ED 10/11 with three day history of AMS, abdominal discomfort, and nausea.  He also reports dysuria for the past 4 days. Head CT negative. CXR no active disease. Seen by SLP 12/28/22 with no overt s/s of dysphagia or aspiration but with recommendations to resume full liquid diet given frequent emesis and ability to upgrade per MD discretion. SLP received new orders to evaluate swallowing due to RN reports of frequent coughing with med adminsitration and during meals with desaturations. Type of Study: Bedside Swallow Evaluation Previous Swallow Assessment: see HPI Diet Prior to this Study: NPO Temperature Spikes Noted: No Respiratory Status: Room air History of Recent Intubation: No Behavior/Cognition: Alert;Pleasant mood;Cooperative Oral Cavity Assessment: Within Functional Limits Oral Care Completed by SLP: No Oral Cavity - Dentition: Adequate natural dentition Vision:  Functional for self-feeding Self-Feeding Abilities: Able to feed self Patient Positioning: Upright in bed Baseline Vocal Quality: Normal Volitional Cough: Strong Volitional Swallow: Able to elicit    Oral/Motor/Sensory Function Overall Oral Motor/Sensory Function: Within functional limits   Ice Chips Ice chips: Not tested   Thin Liquid Thin Liquid: Impaired Presentation: Straw;Self Fed Pharyngeal  Phase Impairments: Wet Vocal Quality;Cough - Immediate;Change in Vital Signs    Nectar Thick Nectar Thick Liquid: Not tested   Honey Thick Honey Thick Liquid: Not tested   Puree Puree: Within functional limits Presentation: Spoon;Self Fed   Solid      Solid: Within functional limits Presentation: Self Fed     Gwynneth Aliment, M.A., CF-SLP Speech Language Pathology, Acute Rehabilitation Services  Secure Chat preferred [phone]  12/30/2022,5:22 PM

## 2022-12-30 NOTE — Evaluation (Signed)
Physical Therapy Evaluation Patient Details Name: [name] MRN: [id number] DOB: [birth year] Today's Date: 12/30/2022  History of Present Illness  67 y.o. male comes into the hospital with confusion, feeling sick to his stomach, and vomiting. PMH: HTN, HLD, IDDM  Clinical Impression  Pt admitted with above. Pt confused and poor historian. Unsure of pt's baseline level of function and cognition. Currently pt requiring totalA for bed mobility and was unable to transfer to EOB this date due to resistance and impaired comprehension of situation and verbal cues. Pt only oriented to self and unable to recall once re-oriented to situation and date. Recommend return to current facility for continued therapy as patient was walking without AD PTA, per patient. Acute PT to cont to follow.        If plan is discharge home, recommend the following:     Can travel by private vehicle   No    Equipment Recommendations None recommended by PT  Recommendations for Other Services       Functional Status Assessment Patient has had a recent decline in their functional status and demonstrates the ability to make significant improvements in function in a reasonable and predictable amount of time.     Precautions / Restrictions Precautions Precautions: Fall Precaution Comments: confusion Restrictions Weight Bearing Restrictions: No      Mobility  Bed Mobility Overal bed mobility: Needs Assistance Bed Mobility: Rolling Rolling: Total assist         General bed mobility comments: attempted several time to assist pt to EOB however pt very resistant stating "I am so weak". pt with poor initiation and comprehension of verbal cues and restitant to tactile cues. Pt pt maxAx2 to scoot up in the bed. unable to achieve trunk elevation to sit on EOB    Transfers                   General transfer comment: unable this date    Ambulation/Gait               General Gait Details: unable  this date  Stairs            Wheelchair Mobility     Tilt Bed    Modified Rankin (Stroke Patients Only)       Balance Overall balance assessment: Needs assistance     Sitting balance - Comments: unable to achieve sitting EOB                                     Pertinent Vitals/Pain Pain Assessment Pain Assessment: No/denies pain    Home Living Family/patient expects to be discharged to:: Skilled nursing facility                   Additional Comments: from [facility]    Prior Function Prior Level of Function : Patient poor historian/Family not available;History of Falls (last six months)             Mobility Comments: per patient he doesn't use a cane or walker, "I just walk" ADLs Comments: reports he is indep with ADLs however facility brings him his food to his room     Extremity/Trunk Assessment   Upper Extremity Assessment Upper Extremity Assessment: Generalized weakness    Lower Extremity Assessment Lower Extremity Assessment: Generalized weakness (R LE resting in external rotation and unable to achieve full knee extension or rotation  back to neutral)    Cervical / Trunk Assessment Cervical / Trunk Assessment: Normal  Communication   Communication Communication: Difficulty communicating thoughts/reduced clarity of speech (poor articulation)  Cognition Arousal: Alert Behavior During Therapy: Flat affect Overall Cognitive Status: No family/caregiver present to determine baseline cognitive functioning                                 General Comments: unsure of patient's baseline level of cognition. Pt reponsive however very resistant to movement displaying fear/anxiety most likely due to pain in abdomen. Pt only oriented to self.        General Comments General comments (skin integrity, edema, etc.): VSS    Exercises     Assessment/Plan    PT Assessment Patient needs continued PT services  PT  Problem List Decreased strength;Decreased activity tolerance;Decreased balance;Decreased mobility;Decreased cognition;Decreased coordination;Decreased safety awareness       PT Treatment Interventions DME instruction;Gait training;Functional mobility training;Therapeutic activities;Therapeutic exercise;Balance training    PT Goals (Current goals can be found in the Care Plan section)  Acute Rehab PT Goals Patient Stated Goal: unable to state PT Goal Formulation: Patient unable to participate in goal setting Time For Goal Achievement: 01/13/23 Potential to Achieve Goals: Fair    Frequency Min 1X/week     Co-evaluation               AM-PAC PT "6 Clicks" Mobility  Outcome Measure Help needed turning from your back to your side while in a flat bed without using bedrails?: Total Help needed moving from lying on your back to sitting on the side of a flat bed without using bedrails?: Total Help needed moving to and from a bed to a chair (including a wheelchair)?: Total Help needed standing up from a chair using your arms (e.g., wheelchair or bedside chair)?: Total Help needed to walk in hospital room?: Total Help needed climbing 3-5 steps with a railing? : Total 6 Click Score: 6    End of Session   Activity Tolerance: Patient limited by pain Patient left: in bed;with call bell/phone within reach;with bed alarm set Nurse Communication: Mobility status PT Visit Diagnosis: Unsteadiness on feet (R26.81);Muscle weakness (generalized) (M62.81)    Time: 7782-4235 PT Time Calculation (min) (ACUTE ONLY): 21 min   Charges:   PT Evaluation $PT Eval Moderate Complexity: 1 Mod   PT General Charges $$ ACUTE PT VISIT: 1 Visit         Lewis Shock, PT, DPT Acute Rehabilitation Services Secure chat preferred Office #: [phone]   Iona Hansen 12/30/2022, 1:11 PM

## 2022-12-30 NOTE — Progress Notes (Signed)
Patient is NPO until swallow eval tomorrow. Lips very dry and cracked. Paged MD for Carmex.

## 2022-12-30 NOTE — Progress Notes (Signed)
PROGRESS NOTE  [name] WUJ:[id number] DOB: 03/21/1955 DOA: 12/27/2022 PCP: Pcp, No   LOS: 3 days   Brief Narrative / Interim history: 67 y.o. male w/ PMH HTN, HLD, IDDM, comes into the hospital with confusion, feeling sick to his stomach.  He tells me that he has been sick with abdominal discomfort and nausea for the past 3 days.  He also reports dysuria for the past 4 days.  He denies any fever or chills at home.  He tells me that he lives by himself, and he had a friend help him come to the hospital  Subjective / 24h Interval events: Tells me he feels better this morning.  No nausea, no abdominal pain  Assesement and Plan: Principal problem Severe sepsis due to presumed UTI -urinalysis with pyuria, he also reported dysuria for the past several days.  Urine cultures speciated Staph aureus yesterday.  Has been narrowed to vancomycin alone -Blood cultures remain negative  Active problems Acute urinary retention-likely in the setting of UTI, Foley catheter had to be placed overnight  Diarrhea-C. difficile and GI pathogen panel negative  Acute metabolic encephalopathy-improving  Anion gap metabolic acidosis-due to lactic acidosis, continue fluids.  ABG reassuring with normal pH.  Lactic now normalized  Hypokalemia, hypomagnesemia-replace potassium this morning, magnesium is normal  LFT elevation -mild, likely in the setting of severe sepsis.  Right upper quadrant ultrasound unremarkable.  LFTs improving  CKD 3 AA -creatinine at base this morning  Thrombocytopenia -due to acute illness, now resolved and platelets are normalized now  DM 2 -continue all-time sliding scale  Lab Results  Component Value Date   HGBA1C 7.0 (H) 12/28/2022   CBG (last 3)  Recent Labs    12/29/22 1554 12/29/22 2140 12/30/22 0638  GLUCAP 170* 110* 136*    Essential hypertension -blood pressure has 1 high reading value but then normalized.  Continue to hold home agents.  May need to resume  later on  CAD, prior non-STEMI-continue aspirin and Plavix  Hyperlipidemia-continue statin  Scheduled Meds:  aspirin EC  81 mg Oral Daily   atorvastatin  40 mg Oral QHS   clopidogrel  75 mg Oral Daily   divalproex  250 mg Oral BID   docusate sodium  100 mg Oral BID   finasteride  5 mg Oral Daily   insulin aspart  0-5 Units Subcutaneous QHS   insulin aspart  0-9 Units Subcutaneous TID WC   memantine  10 mg Oral QHS   metoprolol succinate  25 mg Oral Daily   OLANZapine  2.5 mg Oral BID   potassium chloride  30 mEq Oral Once   sertraline  50 mg Oral Daily   Continuous Infusions:  vancomycin Stopped (12/29/22 1904)   PRN Meds:.hydrALAZINE, LORazepam, oxyCODONE-acetaminophen, polyethylene glycol  Current Outpatient Medications  Medication Instructions   amLODipine (NORVASC) 5 mg, Oral, Daily   aspirin EC 81 mg, Oral, Daily, Swallow whole.   atorvastatin (LIPITOR) 40 mg, Oral, Daily at bedtime   clopidogrel (PLAVIX) 75 mg, Oral, Daily   Colchicine 0.6 mg, Oral, Every 12 hours   divalproex (DEPAKOTE ER) 250 mg, Oral, 2 times daily   Farxiga 10 mg, Oral, Daily   finasteride (PROSCAR) 5 mg, Oral, Daily   HYDROcodone-acetaminophen (NORCO/VICODIN) 5-325 MG tablet 1-2 tablets, Oral, Every 6 hours PRN   insulin lispro (HUMALOG) 4 Units, Subcutaneous, 3 times daily before meals   Lantus SoloStar 20 Units, Subcutaneous, Daily   LORazepam (ATIVAN) 0.5 mg, Oral, Every 8 hours PRN  memantine (NAMENDA) 10 mg, Oral, Daily at bedtime   metoprolol succinate (TOPROL-XL) 25 mg, Oral, Daily   nystatin (MYCOSTATIN/NYSTOP) powder 1 Application, Topical, 3 times daily, To groin   OLANZapine (ZYPREXA) 2.5 mg, Oral, 2 times daily   sertraline (ZOLOFT) 50 mg, Oral, Daily   Zinc Oxide (DESITIN EX) 1 application , Topical, As needed    Diet Orders (From admission, onward)     Start     Ordered   12/28/22 1020  Diet full liquid Room service appropriate? No; Fluid consistency: Thin  Diet effective  now       Question Answer Comment  Room service appropriate? No   Fluid consistency: Thin      12/28/22 1020            DVT prophylaxis: SCDs Start: 12/27/22 2254   Lab Results  Component Value Date   PLT 152 12/30/2022      Code Status: Full Code  Family Communication: no family at bedside   Status is: Inpatient Remains inpatient appropriate because: severity of illness  Level of care: Progressive  Consultants:  none  Objective: Vitals:   12/29/22 2012 12/29/22 2300 12/30/22 0350 12/30/22 0738  BP: (!) 128/91 (!) 135/92 (!) 162/90 115/70  Pulse:      Resp: 16 16 19 15   Temp: 98.2 F (36.8 C) 97.8 F (36.6 C) 98.4 F (36.9 C) 97.6 F (36.4 C)  TempSrc: Oral Axillary Oral Axillary  SpO2: 99% 98% 98% 98%  Weight:      Height:        Intake/Output Summary (Last 24 hours) at 12/30/2022 0839 Last data filed at 12/30/2022 0800 Gross per 24 hour  Intake 681.49 ml  Output 2400 ml  Net -1718.51 ml   Wt Readings from Last 3 Encounters:  12/27/22 90.7 kg  12/14/22 90.7 kg  02/28/22 75.8 kg    Examination:  Constitutional: NAD Eyes: lids and conjunctivae normal, no scleral icterus ENMT: mmm Neck: normal, supple Respiratory: clear to auscultation bilaterally, no wheezing, no crackles. Normal respiratory effort.  Cardiovascular: Regular rate and rhythm, no murmurs / rubs / gallops. No LE edema. Abdomen: soft, no distention, no tenderness. Bowel sounds positive.    Data Reviewed: I have independently reviewed following labs and imaging studies   CBC Recent Labs  Lab 12/27/22 1930 12/28/22 0055 12/28/22 0446 12/29/22 0235 12/30/22 0230  WBC 19.3* 14.4*  --  9.4 11.6*  HGB 18.5* 14.5 15.3 16.1 16.0  HCT 55.8* 44.8 45.0 49.1 47.5  PLT 237 159  --  124* 152  MCV 84.5 86.2  --  84.1 85.7  MCH 28.0 27.9  --  27.6 28.9  MCHC 33.2 32.4  --  32.8 33.7  RDW 18.2* 17.0*  --  17.6* 17.2*  LYMPHSABS 0.7  --   --   --   --   MONOABS 1.4*  --   --   --    --   EOSABS 0.0  --   --   --   --   BASOSABS 0.1  --   --   --   --     Recent Labs  Lab 12/27/22 1930 12/27/22 1941 12/27/22 2153 12/27/22 2216 12/28/22 0040 12/28/22 0052 12/28/22 0055 12/28/22 0446 12/28/22 0521 12/28/22 1804 12/29/22 0235 12/29/22 0727 12/30/22 0230  NA 141  --   --   --   --   --  136 139  --   --  138  --  142  K 4.2  --   --   --   --   --  3.8 3.2*  --   --  3.2*  --  3.0*  CL 104  --   --   --   --   --  106  --   --   --  106  --  110  CO2 18*  --   --   --   --   --  16*  --   --   --  21*  --  20*  GLUCOSE 117*  --   --   --   --   --  121*  --   --   --  131*  --  98  BUN 34*  --   --   --   --   --  31*  --   --   --  26*  --  18  CREATININE 1.50*  --   --   --   --   --  1.25*  --   --   --  1.10  --  0.94  CALCIUM 8.2*  --   --   --   --   --  7.8*  --   --   --  8.2*  --  8.5*  AST 108*  --   --   --   --   --  76*  --   --   --  72*  --  57*  ALT 102*  --   --   --   --   --  75*  --   --   --  74*  --  65*  ALKPHOS 290*  --   --   --   --   --  198*  --   --   --  202*  --  207*  BILITOT 2.1*  --   --   --   --   --  1.2  --   --   --  1.5*  --  1.0  ALBUMIN 2.6*  --   --   --   --   --  1.9*  --   --   --  2.2*  --  2.5*  MG  --   --   --   --   --   --   --   --   --   --  1.5*  --  1.9  LATICACIDVEN  --    < >  --  >15.0*  --  5.0*  --   --  7.4* 2.3*  --  1.0  --   INR  --   --  9.4*  --   --   --   --   --   --   --   --   --   --   TSH 1.856  --   --   --   --   --   --   --   --   --   --   --   --   HGBA1C  --   --   --   --   --   --  7.0*  --   --   --   --   --   --   AMMONIA  --   --   --   --  32  --   --   --   --   --   --   --   --    < > =  values in this interval not displayed.    ------------------------------------------------------------------------------------------------------------------ No results for input(s): "CHOL", "HDL", "LDLCALC", "TRIG", "CHOLHDL", "LDLDIRECT" in the last 72 hours.  Lab Results   Component Value Date   HGBA1C 7.0 (H) 12/28/2022   ------------------------------------------------------------------------------------------------------------------ Recent Labs    12/27/22 1930  TSH 1.856    Cardiac Enzymes No results for input(s): "CKMB", "TROPONINI", "MYOGLOBIN" in the last 168 hours.  Invalid input(s): "CK" ------------------------------------------------------------------------------------------------------------------    Component Value Date/Time   BNP 16.2 02/09/2021 1407    CBG: Recent Labs  Lab 12/29/22 0744 12/29/22 1142 12/29/22 1554 12/29/22 2140 12/30/22 0638  GLUCAP 107* 169* 170* 110* 136*    Recent Results (from the past 240 hour(s))  Resp panel by RT-PCR (RSV, Flu A&B, Covid) Anterior Nasal Swab     Status: None   Collection Time: 12/27/22  6:41 PM   Specimen: Anterior Nasal Swab  Result Value Ref Range Status   SARS Coronavirus 2 by RT PCR NEGATIVE NEGATIVE Final   Influenza A by PCR NEGATIVE NEGATIVE Final   Influenza B by PCR NEGATIVE NEGATIVE Final    Comment: (NOTE) The Xpert Xpress SARS-CoV-2/FLU/RSV plus assay is intended as an aid in the diagnosis of influenza from Nasopharyngeal swab specimens and should not be used as a sole basis for treatment. Nasal washings and aspirates are unacceptable for Xpert Xpress SARS-CoV-2/FLU/RSV testing.  Fact Sheet for Patients: BloggerCourse.com  Fact Sheet for Healthcare Providers: SeriousBroker.it  This test is not yet approved or cleared by the [place] FDA and has been authorized for detection and/or diagnosis of SARS-CoV-2 by FDA under an Emergency Use Authorization (EUA). This EUA will remain in effect (meaning this test can be used) for the duration of the COVID-19 declaration under Section 564(b)(1) of the Act, 21 U.S.C. section 360bbb-3(b)(1), unless the authorization is terminated or revoked.     Resp Syncytial  Virus by PCR NEGATIVE NEGATIVE Final    Comment: (NOTE) Fact Sheet for Patients: BloggerCourse.com  Fact Sheet for Healthcare Providers: SeriousBroker.it  This test is not yet approved or cleared by the [place] FDA and has been authorized for detection and/or diagnosis of SARS-CoV-2 by FDA under an Emergency Use Authorization (EUA). This EUA will remain in effect (meaning this test can be used) for the duration of the COVID-19 declaration under Section 564(b)(1) of the Act, 21 U.S.C. section 360bbb-3(b)(1), unless the authorization is terminated or revoked.  Performed at [facility], [address], [place], [place] [address]   Blood Culture (routine x 2)     Status: None (Preliminary result)   Collection Time: 12/27/22  7:50 PM   Specimen: BLOOD  Result Value Ref Range Status   Specimen Description BLOOD RIGHT ANTECUBITAL  Final   Special Requests   Final    BOTTLES DRAWN AEROBIC AND ANAEROBIC Blood Culture adequate volume   Culture   Final    NO GROWTH 3 DAYS Performed at [facility], [address], [place], [place] [address]    Report Status PENDING  Incomplete  Blood Culture (routine x 2)     Status: None (Preliminary result)   Collection Time: 12/27/22  7:50 PM   Specimen: BLOOD  Result Value Ref Range Status   Specimen Description BLOOD RIGHT ANTECUBITAL  Final   Special Requests   Final    BOTTLES DRAWN AEROBIC AND ANAEROBIC Blood Culture results may not be optimal due to an inadequate volume of blood received in culture bottles   Culture  Final    NO GROWTH 3 DAYS Performed at [facility], [address] [place], [place] [address]    Report Status PENDING  Incomplete  Urine Culture     Status: Abnormal (Preliminary result)   Collection Time: 12/28/22  3:48 AM   Specimen: Urine, Random  Result Value Ref Range Status   Specimen Description URINE, RANDOM  Final   Special Requests NONE  Reflexed from [id number]  Final   Culture (A)  Final    >=100,000 COLONIES/mL STAPHYLOCOCCUS AUREUS SUSCEPTIBILITIES TO FOLLOW Performed at [facility], [address] [place], [place] [address]    Report Status PENDING  Incomplete  C Difficile Quick Screen w PCR reflex     Status: None   Collection Time: 12/28/22  4:07 PM   Specimen: STOOL  Result Value Ref Range Status   C Diff antigen NEGATIVE NEGATIVE Final   C Diff toxin NEGATIVE NEGATIVE Final   C Diff interpretation No C. difficile detected.  Final    Comment: Performed at [facility], [address], [place], [place] [address]  Gastrointestinal Panel by PCR , Stool     Status: None   Collection Time: 12/28/22  4:07 PM   Specimen: Stool  Result Value Ref Range Status   Campylobacter species NOT DETECTED NOT DETECTED Final   Plesimonas shigelloides NOT DETECTED NOT DETECTED Final   Salmonella species NOT DETECTED NOT DETECTED Final   Yersinia enterocolitica NOT DETECTED NOT DETECTED Final   Vibrio species NOT DETECTED NOT DETECTED Final   Vibrio cholerae NOT DETECTED NOT DETECTED Final   Enteroaggregative E coli (EAEC) NOT DETECTED NOT DETECTED Final   Enteropathogenic E coli (EPEC) NOT DETECTED NOT DETECTED Final   Enterotoxigenic E coli (ETEC) NOT DETECTED NOT DETECTED Final   Shiga like toxin producing E coli (STEC) NOT DETECTED NOT DETECTED Final   Shigella/Enteroinvasive E coli (EIEC) NOT DETECTED NOT DETECTED Final   Cryptosporidium NOT DETECTED NOT DETECTED Final   Cyclospora cayetanensis NOT DETECTED NOT DETECTED Final   Entamoeba histolytica NOT DETECTED NOT DETECTED Final   Giardia lamblia NOT DETECTED NOT DETECTED Final   Adenovirus F40/41 NOT DETECTED NOT DETECTED Final   Astrovirus NOT DETECTED NOT DETECTED Final   Norovirus GI/GII NOT DETECTED NOT DETECTED Final   Rotavirus A NOT DETECTED NOT DETECTED Final   Sapovirus (I, II, IV, and V) NOT DETECTED NOT DETECTED Final    Comment: Performed at  [facility], [address] [place], [place] [address]     Radiology Studies: US Abdomen Limited RUQ (LIVER/GB)  Result Date: 12/29/2022 CLINICAL DATA:  Elevated liver function tests EXAM: ULTRASOUND ABDOMEN LIMITED RIGHT UPPER QUADRANT COMPARISON:  CT abdomen pelvis with contrast 12/27/2022 FINDINGS: Gallbladder: Gallstones: None Sludge: Present Gallbladder Wall: Within normal limits Pericholecystic fluid: None Sonographic Murphy's Sign: Negative per technologist Common bile duct: Diameter: 4 mm Liver: Parenchymal echogenicity: Within normal limits Contours: Normal Lesions: None Portal vein: Patent.  Hepatopetal flow Other: Unchanged severe right hydronephrosis. IMPRESSION: 1. No acute abnormality of the liver or gallbladder. 2. Mild gallbladder sludge without additional sonographic evidence of acute cholecystitis. Electronically Signed   By: Acquanetta Belling M.D.   On: 12/29/2022 09:31     Pamella Pert, MD, PhD [facility]  Between 7 am - 7 pm I am available, please contact me via Amion (for emergencies) or Securechat (non urgent messages)  Between 7 pm - 7 am I am not available, please contact night coverage  MD/APP via Loretha Stapler

## 2022-12-30 NOTE — Progress Notes (Signed)
Hypokalemia: Nurse paged me to inform that morning lab check showed low potassium 3. -Oral KCl 40 mg has been ordered by the daytime physician.   Tereasa Coop, MD [facility] 12/30/2022, 6:59 AM

## 2022-12-31 ENCOUNTER — Inpatient Hospital Stay (HOSPITAL_COMMUNITY): Payer: 59

## 2022-12-31 DIAGNOSIS — A419 Sepsis, unspecified organism: Secondary | ICD-10-CM | POA: Diagnosis not present

## 2022-12-31 DIAGNOSIS — R6520 Severe sepsis without septic shock: Secondary | ICD-10-CM | POA: Diagnosis not present

## 2022-12-31 LAB — CBC
HCT: 43 % (ref 39.0–52.0)
Hemoglobin: 14 g/dL (ref 13.0–17.0)
MCH: 28 pg (ref 26.0–34.0)
MCHC: 32.6 g/dL (ref 30.0–36.0)
MCV: 86 fL (ref 80.0–100.0)
Platelets: 134 10*3/uL — ABNORMAL LOW (ref 150–400)
RBC: 5 MIL/uL (ref 4.22–5.81)
RDW: 17.2 % — ABNORMAL HIGH (ref 11.5–15.5)
WBC: 10.1 10*3/uL (ref 4.0–10.5)
nRBC: 0 % (ref 0.0–0.2)

## 2022-12-31 LAB — GLUCOSE, CAPILLARY
Glucose-Capillary: 117 mg/dL — ABNORMAL HIGH (ref 70–99)
Glucose-Capillary: 147 mg/dL — ABNORMAL HIGH (ref 70–99)
Glucose-Capillary: 130 mg/dL — ABNORMAL HIGH (ref 70–99)
Glucose-Capillary: 136 mg/dL — ABNORMAL HIGH (ref 70–99)

## 2022-12-31 LAB — COMPREHENSIVE METABOLIC PANEL
ALT: 50 U/L — ABNORMAL HIGH (ref 0–44)
AST: 40 U/L (ref 15–41)
Albumin: 2.1 g/dL — ABNORMAL LOW (ref 3.5–5.0)
Alkaline Phosphatase: 177 U/L — ABNORMAL HIGH (ref 38–126)
Anion gap: 9 (ref 5–15)
BUN: 17 mg/dL (ref 8–23)
CO2: 22 mmol/L (ref 22–32)
Calcium: 8.5 mg/dL — ABNORMAL LOW (ref 8.9–10.3)
Chloride: 112 mmol/L — ABNORMAL HIGH (ref 98–111)
Creatinine, Ser: 1.07 mg/dL (ref 0.61–1.24)
GFR, Estimated: >60 mL/min (ref >60)
Glucose, Bld: 125 mg/dL — ABNORMAL HIGH (ref 70–99)
Potassium: 3.1 mmol/L — ABNORMAL LOW (ref 3.5–5.1)
Sodium: 143 mmol/L (ref 135–145)
Total Bilirubin: 0.9 mg/dL (ref 0.3–1.2)
Total Protein: 5.6 g/dL — ABNORMAL LOW (ref 6.5–8.1)

## 2022-12-31 LAB — MAGNESIUM: Magnesium: 1.8 mg/dL (ref 1.7–2.4)

## 2022-12-31 MED ORDER — DOCUSATE SODIUM 50 MG/5ML PO LIQD
100.0000 mg | Freq: Two times a day (BID) | ORAL | Status: DC
  Administered 2022-12-31 – 2023-01-02 (×4): 100 mg via ORAL
  Filled 2022-12-31 (×5): qty 10

## 2022-12-31 MED ORDER — LACTATED RINGERS IV SOLN: INTRAVENOUS | Status: AC

## 2022-12-31 MED ORDER — ASPIRIN 81 MG PO CHEW
81.0000 mg | CHEWABLE_TABLET | Freq: Every day | ORAL | Status: DC
  Administered 2022-12-31 – 2023-01-02 (×3): 81 mg via ORAL
  Filled 2022-12-31 (×3): qty 1

## 2022-12-31 MED ORDER — OLANZAPINE 5 MG PO TBDP
2.5000 mg | ORAL_TABLET | Freq: Two times a day (BID) | ORAL | Status: DC
  Administered 2022-12-31 – 2023-01-02 (×5): 2.5 mg via ORAL
  Filled 2022-12-31 (×6): qty 0.5

## 2022-12-31 MED ORDER — POTASSIUM CHLORIDE 20 MEQ PO PACK
40.0000 meq | PACK | ORAL | Status: AC
  Administered 2022-12-31: 40 meq via ORAL
  Filled 2022-12-31: qty 2

## 2022-12-31 MED ORDER — VALPROIC ACID 250 MG/5ML PO SOLN
250.0000 mg | Freq: Two times a day (BID) | ORAL | Status: DC
  Administered 2022-12-31 – 2023-01-02 (×5): 250 mg via ORAL
  Filled 2022-12-31 (×6): qty 5

## 2022-12-31 NOTE — Progress Notes (Addendum)
  Decreased urinary output  Acute kidney injury CKD stage IIIa Patient's nurses reporting that patient has only 275 cc urine output since 5 PM 12/30/2022.  He had liter 1 of urine output 5 PM yesterday.  -Morning labs showing creatinine has been trended up 0.94 to 1.07. -Per chart review patient has been admitted for severe sepsis due to UTI. - Encouraging patient to increase oral hydration and starting 100 cc/h for 10 hours.  Tereasa Coop, MD [facility] 12/31/2022, 6:23 AM

## 2022-12-31 NOTE — Plan of Care (Signed)

## 2022-12-31 NOTE — Progress Notes (Addendum)
PROGRESS NOTE  KALLUM BUSHEY ZOX:[id number] DOB: Mar 07, 1956 DOA: 12/27/2022 PCP: Pcp, No   LOS: 4 days   Brief Narrative / Interim history: 67 y.o. male w/ PMH HTN, HLD, IDDM, long-term SNF resident, comes into the hospital with confusion, feeling sick to his stomach.  He tells me that he has been sick with abdominal discomfort and nausea for the past 3 days.  He also reports dysuria for the past 4 days.  He denies any fever or chills at home.  He tells me that he lives by himself, and he had a friend help him come to the hospital  Subjective / 24h Interval events: He is feeling better.  Abdominal pain resolved, no longer feels nauseous.  Has been having increased difficulties swallowing food last night, he was made n.p.o. last night.  Night nurse tells me that he has been having increased gurgling sounds  Assesement and Plan: Principal problem Severe sepsis due due to Staph aureus UTI-urinalysis with pyuria, he also reported dysuria for the past several days.  Urine cultures with MRSA -Case discussed with Dr. Algis Liming with ID over the phone, thankfully blood cultures are negative.  Obtain a 2D echo to evaluate for vegetations -Has been narrowed to linezolid  Active problems Acute urinary retention-likely in the setting of UTI, continue Foley catheter.  Voiding trial either when close to discharge  Dysphagia, concern for aspiration - Speech consulted, will get a barium swallow eval today.  Obtain a chest x-ray  Diarrhea-C. difficile and GI pathogen panel negative  Acute metabolic encephalopathy-improving, now ,more alert  Anion gap metabolic acidosis-due to lactic acidosis, continue fluids.  ABG reassuring with normal pH.  Lactic now normalized  Hypokalemia, hypomagnesemia-replace K again today.  Magnesium okay  LFT elevation -mild, likely in the setting of severe sepsis.  Right upper quadrant ultrasound unremarkable.  Overall improving  CKD 3 AA -creatinine at  Thrombocytopenia -due  to acute illness, platelets stable  DM 2 -continue sliding scale  Lab Results  Component Value Date   HGBA1C 7.0 (H) 12/28/2022   CBG (last 3)  Recent Labs    12/30/22 1721 12/30/22 2128 12/31/22 0616  GLUCAP 111* 114* 117*    Essential hypertension -Home antihypertensives held in the setting of sepsis, blood pressure now normal, continue to hold  CAD, prior non-STEMI-continue aspirin and Plavix  Hyperlipidemia-continue statin  Scheduled Meds:  aspirin EC  81 mg Oral Daily   atorvastatin  40 mg Oral QHS   Chlorhexidine Gluconate Cloth  6 each Topical Q0600   Chlorhexidine Gluconate Cloth  6 each Topical Daily   clopidogrel  75 mg Oral Daily   divalproex  250 mg Oral BID   docusate sodium  100 mg Oral BID   finasteride  5 mg Oral Daily   insulin aspart  0-5 Units Subcutaneous QHS   insulin aspart  0-9 Units Subcutaneous TID WC   linezolid  600 mg Oral Q12H   memantine  10 mg Oral QHS   metoprolol succinate  25 mg Oral Daily   mupirocin ointment  1 Application Nasal BID   OLANZapine  2.5 mg Oral BID   potassium chloride  30 mEq Oral Once   Continuous Infusions:  lactated ringers 100 mL/hr at 12/31/22 0801   PRN Meds:.hydrALAZINE, lip balm, LORazepam, ondansetron (ZOFRAN) IV, oxyCODONE-acetaminophen, polyethylene glycol  Current Outpatient Medications  Medication Instructions   amLODipine (NORVASC) 5 mg, Oral, Daily   aspirin EC 81 mg, Oral, Daily, Swallow whole.   atorvastatin (LIPITOR)  40 mg, Oral, Daily at bedtime   clopidogrel (PLAVIX) 75 mg, Oral, Daily   Colchicine 0.6 mg, Oral, Every 12 hours   divalproex (DEPAKOTE ER) 250 mg, Oral, 2 times daily   Farxiga 10 mg, Oral, Daily   finasteride (PROSCAR) 5 mg, Oral, Daily   HYDROcodone-acetaminophen (NORCO/VICODIN) 5-325 MG tablet 1-2 tablets, Oral, Every 6 hours PRN   insulin lispro (HUMALOG) 4 Units, Subcutaneous, 3 times daily before meals   Lantus SoloStar 20 Units, Subcutaneous, Daily   LORazepam (ATIVAN)  0.5 mg, Oral, Every 8 hours PRN   memantine (NAMENDA) 10 mg, Oral, Daily at bedtime   metoprolol succinate (TOPROL-XL) 25 mg, Oral, Daily   nystatin (MYCOSTATIN/NYSTOP) powder 1 Application, Topical, 3 times daily, To groin   OLANZapine (ZYPREXA) 2.5 mg, Oral, 2 times daily   sertraline (ZOLOFT) 50 mg, Oral, Daily   Zinc Oxide (DESITIN EX) 1 application , Topical, As needed    Diet Orders (From admission, onward)     Start     Ordered   12/30/22 1730  Diet NPO time specified Except for: Other (See Comments)  Diet effective now       Comments: Meds crushed in apple sauce.  Question:  Except for  Answer:  Other (See Comments)   12/30/22 1730            DVT prophylaxis: SCDs Start: 12/27/22 2254   Lab Results  Component Value Date   PLT 134 (L) 12/31/2022      Code Status: Full Code  Family Communication: no family at bedside, patient declined for me to call any family members  Status is: Inpatient Remains inpatient appropriate because: severity of illness  Level of care: Progressive  Consultants:  none  Objective: Vitals:   12/30/22 2030 12/30/22 2336 12/31/22 0443 12/31/22 0722  BP: 118/67 103/68 123/76 114/80  Pulse: (!) 51 (!) 48 61 (!) 48  Resp: 14 14 19 13   Temp: 98.2 F (36.8 C) 98.5 F (36.9 C) 97.7 F (36.5 C) 98.2 F (36.8 C)  TempSrc: Oral Oral Oral Oral  SpO2: 97% 95% 93% 95%  Weight:      Height:        Intake/Output Summary (Last 24 hours) at 12/31/2022 1115 Last data filed at 12/31/2022 0865 Gross per 24 hour  Intake --  Output 475 ml  Net -475 ml   Wt Readings from Last 3 Encounters:  12/27/22 90.7 kg  12/14/22 90.7 kg  02/28/22 75.8 kg    Examination:  Constitutional: NAD Eyes: lids and conjunctivae normal, no scleral icterus ENMT: mmm Neck: normal, supple Respiratory: Faint crackles, no wheezing heard Cardiovascular: Regular rate and rhythm, no murmurs / rubs / gallops. No LE edema. Abdomen: soft, no distention, no  tenderness. Bowel sounds positive.   Data Reviewed: I have independently reviewed following labs and imaging studies   CBC Recent Labs  Lab 12/27/22 1930 12/28/22 0055 12/28/22 0446 12/29/22 0235 12/30/22 0230 12/31/22 0226  WBC 19.3* 14.4*  --  9.4 11.6* 10.1  HGB 18.5* 14.5 15.3 16.1 16.0 14.0  HCT 55.8* 44.8 45.0 49.1 47.5 43.0  PLT 237 159  --  124* 152 134*  MCV 84.5 86.2  --  84.1 85.7 86.0  MCH 28.0 27.9  --  27.6 28.9 28.0  MCHC 33.2 32.4  --  32.8 33.7 32.6  RDW 18.2* 17.0*  --  17.6* 17.2* 17.2*  LYMPHSABS 0.7  --   --   --   --   --  MONOABS 1.4*  --   --   --   --   --   EOSABS 0.0  --   --   --   --   --   BASOSABS 0.1  --   --   --   --   --     Recent Labs  Lab 12/27/22 1930 12/27/22 1941 12/27/22 2153 12/27/22 2216 12/28/22 0040 12/28/22 0052 12/28/22 0055 12/28/22 0446 12/28/22 0521 12/28/22 1804 12/29/22 0235 12/29/22 0727 12/30/22 0230 12/31/22 0226  NA 141  --   --   --   --   --  136 139  --   --  138  --  142 143  K 4.2  --   --   --   --   --  3.8 3.2*  --   --  3.2*  --  3.0* 3.1*  CL 104  --   --   --   --   --  106  --   --   --  106  --  110 112*  CO2 18*  --   --   --   --   --  16*  --   --   --  21*  --  20* 22  GLUCOSE 117*  --   --   --   --   --  121*  --   --   --  131*  --  98 125*  BUN 34*  --   --   --   --   --  31*  --   --   --  26*  --  18 17  CREATININE 1.50*  --   --   --   --   --  1.25*  --   --   --  1.10  --  0.94 1.07  CALCIUM 8.2*  --   --   --   --   --  7.8*  --   --   --  8.2*  --  8.5* 8.5*  AST 108*  --   --   --   --   --  76*  --   --   --  72*  --  57* 40  ALT 102*  --   --   --   --   --  75*  --   --   --  74*  --  65* 50*  ALKPHOS 290*  --   --   --   --   --  198*  --   --   --  202*  --  207* 177*  BILITOT 2.1*  --   --   --   --   --  1.2  --   --   --  1.5*  --  1.0 0.9  ALBUMIN 2.6*  --   --   --   --   --  1.9*  --   --   --  2.2*  --  2.5* 2.1*  MG  --   --   --   --   --   --   --   --   --    --  1.5*  --  1.9 1.8  LATICACIDVEN  --    < >  --  >15.0*  --  5.0*  --   --  7.4* 2.3*  --  1.0  --   --  INR  --   --  9.4*  --   --   --   --   --   --   --   --   --   --   --   TSH 1.856  --   --   --   --   --   --   --   --   --   --   --   --   --   HGBA1C  --   --   --   --   --   --  7.0*  --   --   --   --   --   --   --   AMMONIA  --   --   --   --  32  --   --   --   --   --   --   --   --   --    < > = values in this interval not displayed.    ------------------------------------------------------------------------------------------------------------------ No results for input(s): "CHOL", "HDL", "LDLCALC", "TRIG", "CHOLHDL", "LDLDIRECT" in the last 72 hours.  Lab Results  Component Value Date   HGBA1C 7.0 (H) 12/28/2022   ------------------------------------------------------------------------------------------------------------------ No results for input(s): "TSH", "T4TOTAL", "T3FREE", "THYROIDAB" in the last 72 hours.  Invalid input(s): "FREET3"   Cardiac Enzymes No results for input(s): "CKMB", "TROPONINI", "MYOGLOBIN" in the last 168 hours.  Invalid input(s): "CK" ------------------------------------------------------------------------------------------------------------------    Component Value Date/Time   BNP 16.2 02/09/2021 1407    CBG: Recent Labs  Lab 12/30/22 0638 12/30/22 1102 12/30/22 1721 12/30/22 2128 12/31/22 0616  GLUCAP 136* 114* 111* 114* 117*    Recent Results (from the past 240 hour(s))  Resp panel by RT-PCR (RSV, Flu A&B, Covid) Anterior Nasal Swab     Status: None   Collection Time: 12/27/22  6:41 PM   Specimen: Anterior Nasal Swab  Result Value Ref Range Status   SARS Coronavirus 2 by RT PCR NEGATIVE NEGATIVE Final   Influenza A by PCR NEGATIVE NEGATIVE Final   Influenza B by PCR NEGATIVE NEGATIVE Final    Comment: (NOTE) The Xpert Xpress SARS-CoV-2/FLU/RSV plus assay is intended as an aid in the diagnosis of influenza from  Nasopharyngeal swab specimens and should not be used as a sole basis for treatment. Nasal washings and aspirates are unacceptable for Xpert Xpress SARS-CoV-2/FLU/RSV testing.  Fact Sheet for Patients: BloggerCourse.com  Fact Sheet for Healthcare Providers: SeriousBroker.it  This test is not yet approved or cleared by the [place] FDA and has been authorized for detection and/or diagnosis of SARS-CoV-2 by FDA under an Emergency Use Authorization (EUA). This EUA will remain in effect (meaning this test can be used) for the duration of the COVID-19 declaration under Section 564(b)(1) of the Act, 21 U.S.C. section 360bbb-3(b)(1), unless the authorization is terminated or revoked.     Resp Syncytial Virus by PCR NEGATIVE NEGATIVE Final    Comment: (NOTE) Fact Sheet for Patients: BloggerCourse.com  Fact Sheet for Healthcare Providers: SeriousBroker.it  This test is not yet approved or cleared by the [place] FDA and has been authorized for detection and/or diagnosis of SARS-CoV-2 by FDA under an Emergency Use Authorization (EUA). This EUA will remain in effect (meaning this test can be used) for the duration of the COVID-19 declaration under Section 564(b)(1) of the Act, 21 U.S.C. section 360bbb-3(b)(1), unless the authorization is terminated or revoked.  Performed at [facility], [address] [place], [place] [address]   Blood Culture (routine x 2)     Status: None (Preliminary result)   Collection Time: 12/27/22  7:50 PM   Specimen: BLOOD  Result Value Ref Range Status   Specimen Description BLOOD RIGHT ANTECUBITAL  Final   Special Requests   Final    BOTTLES DRAWN AEROBIC AND ANAEROBIC Blood Culture adequate volume   Culture   Final    NO GROWTH 4 DAYS Performed at [facility], [address] [place], [place] [address]    Report Status PENDING   Incomplete  Blood Culture (routine x 2)     Status: None (Preliminary result)   Collection Time: 12/27/22  7:50 PM   Specimen: BLOOD  Result Value Ref Range Status   Specimen Description BLOOD RIGHT ANTECUBITAL  Final   Special Requests   Final    BOTTLES DRAWN AEROBIC AND ANAEROBIC Blood Culture results may not be optimal due to an inadequate volume of blood received in culture bottles   Culture   Final    NO GROWTH 4 DAYS Performed at [facility], [address] [place], [place] [address]    Report Status PENDING  Incomplete  Urine Culture     Status: Abnormal   Collection Time: 12/28/22  3:48 AM   Specimen: Urine, Random  Result Value Ref Range Status   Specimen Description URINE, RANDOM  Final   Special Requests   Final    NONE Reflexed from [id number] Performed at [facility], [address], [place], [place] [address]    Culture (A)  Final    >=100,000 COLONIES/mL METHICILLIN RESISTANT STAPHYLOCOCCUS AUREUS   Report Status 12/30/2022 FINAL  Final   Organism ID, Bacteria METHICILLIN RESISTANT STAPHYLOCOCCUS AUREUS (A)  Final      Susceptibility   Methicillin resistant staphylococcus aureus - MIC*    CIPROFLOXACIN >=8 RESISTANT Resistant     GENTAMICIN <=0.5 SENSITIVE Sensitive     NITROFURANTOIN <=16 SENSITIVE Sensitive     OXACILLIN >=4 RESISTANT Resistant     TETRACYCLINE <=1 SENSITIVE Sensitive     VANCOMYCIN 1 SENSITIVE Sensitive     TRIMETH/SULFA <=10 SENSITIVE Sensitive     CLINDAMYCIN <=0.25 SENSITIVE Sensitive     RIFAMPIN <=0.5 SENSITIVE Sensitive     Inducible Clindamycin NEGATIVE Sensitive     LINEZOLID 2 SENSITIVE Sensitive     * >=100,000 COLONIES/mL METHICILLIN RESISTANT STAPHYLOCOCCUS AUREUS  C Difficile Quick Screen w PCR reflex     Status: None   Collection Time: 12/28/22  4:07 PM   Specimen: STOOL  Result Value Ref Range Status   C Diff antigen NEGATIVE NEGATIVE Final   C Diff toxin NEGATIVE NEGATIVE Final   C Diff interpretation No C.  difficile detected.  Final    Comment: Performed at [facility], [address], [place], [place] [address]  Gastrointestinal Panel by PCR , Stool     Status: None   Collection Time: 12/28/22  4:07 PM   Specimen: Stool  Result Value Ref Range Status   Campylobacter species NOT DETECTED NOT DETECTED Final   Plesimonas shigelloides NOT DETECTED NOT DETECTED Final   Salmonella species NOT DETECTED NOT DETECTED Final   Yersinia enterocolitica NOT DETECTED NOT DETECTED Final   Vibrio species NOT DETECTED NOT DETECTED Final   Vibrio cholerae NOT DETECTED NOT DETECTED Final   Enteroaggregative E coli (EAEC) NOT DETECTED NOT DETECTED Final   Enteropathogenic E coli (EPEC) NOT  DETECTED NOT DETECTED Final   Enterotoxigenic E coli (ETEC) NOT DETECTED NOT DETECTED Final   Shiga like toxin producing E coli (STEC) NOT DETECTED NOT DETECTED Final   Shigella/Enteroinvasive E coli (EIEC) NOT DETECTED NOT DETECTED Final   Cryptosporidium NOT DETECTED NOT DETECTED Final   Cyclospora cayetanensis NOT DETECTED NOT DETECTED Final   Entamoeba histolytica NOT DETECTED NOT DETECTED Final   Giardia lamblia NOT DETECTED NOT DETECTED Final   Adenovirus F40/41 NOT DETECTED NOT DETECTED Final   Astrovirus NOT DETECTED NOT DETECTED Final   Norovirus GI/GII NOT DETECTED NOT DETECTED Final   Rotavirus A NOT DETECTED NOT DETECTED Final   Sapovirus (I, II, IV, and V) NOT DETECTED NOT DETECTED Final    Comment: Performed at [facility], [address] [place], [place] [address]  MRSA Next Gen by PCR, Nasal     Status: Abnormal   Collection Time: 12/30/22 12:25 PM   Specimen: Nasal Mucosa; Nasal Swab  Result Value Ref Range Status   MRSA by PCR Next Gen DETECTED (A) NOT DETECTED Final    Comment: RESULT CALLED TO, READ BACK BY AND VERIFIED WITH: RN SARA.K AT 1405 ON 12/30/2022 BY T.SAAD. (NOTE) The GeneXpert MRSA Assay (FDA approved for NASAL specimens only), is one component of a comprehensive  MRSA colonization surveillance program. It is not intended to diagnose MRSA infection nor to guide or monitor treatment for MRSA infections. Test performance is not FDA approved in patients less than 48 years old. Performed at [facility], [address] [place], [place] [address]      Radiology Studies: No results found.   Pamella Pert, MD, PhD [facility]  Between 7 am - 7 pm I am available, please contact me via Amion (for emergencies) or Securechat (non urgent messages)  Between 7 pm - 7 am I am not available, please contact night coverage MD/APP via Amion

## 2022-12-31 NOTE — Evaluation (Addendum)
Modified Barium Swallow Study  Patient Details  Name: [name] MRN: [id number] Date of Birth: 10-16-1955  Today's Date: 12/31/2022  Modified Barium Swallow completed.  Full report located under Chart Review in the Imaging Section.  History of Present Illness Pt is a 67 yo. male with PMH including HTN, HLD, IDDM. Presenting to ED 10/11 with three day history of AMS, abdominal discomfort, and nausea.  He also reports dysuria for the past 4 days. Head CT negative. CXR no active disease. Seen by SLP 12/28/22 with no overt s/s of dysphagia or aspiration but with recommendations to resume full liquid diet given frequent emesis and ability to upgrade per MD discretion. SLP received new orders to evaluate swallowing due to RN reports of frequent coughing with med adminsitration and during meals with desaturations.   Clinical Impression Patient presents with a mild oropharyngeal dysphagia characterized primarily by intermittently delayed swallow initiation to the level of the pyriform sinuses with resultant aspiration of thin liquids, prevented with use of small single cup sips. Otherwise, oral phase efficient for mastication of regular texture solids. Trace vallecular and pyriform sinus coating noted post swallow clears with spontaenous dry swallows. Recommend po diet wtih use of aspiration precautions/compensatory strategies and f/u from SLP services for diet tolerance. Suspect that during po intake, mixed consistency boluses and/or rapid rate of intake will increase degree of swallow delay and increase risk of aspiration. Note that patient did cough some during study outside of the presence of aspiration.   Factors that may increase risk of adverse event in presence of aspiration Rubye Oaks & Clearance Coots 2021):    Swallow Evaluation Recommendations Recommendations: PO diet PO Diet Recommendation: Thin liquids (Level 0);Regular Liquid Administration via: Cup;No straw Medication Administration: Crushed  with puree Supervision: Staff to assist with self-feeding;Full assist for feeding Swallowing strategies  : Slow rate;Small bites/sips Postural changes: Position pt fully upright for meals Oral care recommendations: Oral care BID (2x/day)    Zendayah Hardgrave MA, CCC-SLP   [place]i Ahart Meryl 12/31/2022,11:16 AM

## 2022-12-31 NOTE — Progress Notes (Signed)
Patient's potassium back at 3.1. Paged Dr. Janalyn Shy to alert.

## 2022-12-31 NOTE — Progress Notes (Signed)
2D echo attempted, patient leaving department for procedure. Will try later

## 2022-12-31 NOTE — Plan of Care (Signed)
  Problem: Skin Integrity: Goal: Risk for impaired skin integrity will decrease Outcome: Progressing   Problem: Education: Goal: Knowledge of General Education information will improve Description: Including pain rating scale, medication(s)/side effects and non-pharmacologic comfort measures Outcome: Progressing   Problem: Health Behavior/Discharge Planning: Goal: Ability to manage health-related needs will improve Outcome: Progressing   Problem: Coping: Goal: Level of anxiety will decrease Outcome: Progressing   Problem: Skin Integrity: Goal: Risk for impaired skin integrity will decrease Outcome: Progressing

## 2022-12-31 NOTE — Progress Notes (Addendum)
Patient with Foley for urinary retention. Only 275 urine output from 1700 p.m.until now at 0620 a.m. Alerted Dr. Janalyn Shy, who acknowledged. See orders.

## 2023-01-01 ENCOUNTER — Inpatient Hospital Stay (HOSPITAL_COMMUNITY): Payer: 59

## 2023-01-01 DIAGNOSIS — A419 Sepsis, unspecified organism: Secondary | ICD-10-CM | POA: Diagnosis not present

## 2023-01-01 DIAGNOSIS — R7881 Bacteremia: Secondary | ICD-10-CM | POA: Diagnosis not present

## 2023-01-01 DIAGNOSIS — R6520 Severe sepsis without septic shock: Secondary | ICD-10-CM | POA: Diagnosis not present

## 2023-01-01 LAB — ECHOCARDIOGRAM COMPLETE
AR max vel: 2.51 cm2
AV Area VTI: 2.55 cm2
AV Area mean vel: 2.56 cm2
AV Mean grad: 5 mm[Hg]
AV Peak grad: 10.1 mm[Hg]
Ao pk vel: 1.59 m/s
Area-P 1/2: 3.27 cm2
Height: 66 in
S' Lateral: 2.2 cm
Weight: 3200 [oz_av]

## 2023-01-01 LAB — COMPREHENSIVE METABOLIC PANEL
ALT: 44 U/L (ref 0–44)
AST: 40 U/L (ref 15–41)
Albumin: 2.1 g/dL — ABNORMAL LOW (ref 3.5–5.0)
Alkaline Phosphatase: 189 U/L — ABNORMAL HIGH (ref 38–126)
Anion gap: 9 (ref 5–15)
BUN: 13 mg/dL (ref 8–23)
CO2: 23 mmol/L (ref 22–32)
Calcium: 8.3 mg/dL — ABNORMAL LOW (ref 8.9–10.3)
Chloride: 109 mmol/L (ref 98–111)
Creatinine, Ser: 0.95 mg/dL (ref 0.61–1.24)
GFR, Estimated: >60 mL/min (ref >60)
Glucose, Bld: 159 mg/dL — ABNORMAL HIGH (ref 70–99)
Potassium: 3.4 mmol/L — ABNORMAL LOW (ref 3.5–5.1)
Sodium: 141 mmol/L (ref 135–145)
Total Bilirubin: 0.8 mg/dL (ref 0.3–1.2)
Total Protein: 5.3 g/dL — ABNORMAL LOW (ref 6.5–8.1)

## 2023-01-01 LAB — GLUCOSE, CAPILLARY
Glucose-Capillary: 120 mg/dL — ABNORMAL HIGH (ref 70–99)
Glucose-Capillary: 139 mg/dL — ABNORMAL HIGH (ref 70–99)
Glucose-Capillary: 112 mg/dL — ABNORMAL HIGH (ref 70–99)
Glucose-Capillary: 113 mg/dL — ABNORMAL HIGH (ref 70–99)

## 2023-01-01 LAB — CULTURE, BLOOD (ROUTINE X 2)
Culture: NO GROWTH
Culture: NO GROWTH
Special Requests: ADEQUATE

## 2023-01-01 LAB — CBC
HCT: 42.3 % (ref 39.0–52.0)
Hemoglobin: 13.9 g/dL (ref 13.0–17.0)
MCH: 29 pg (ref 26.0–34.0)
MCHC: 32.9 g/dL (ref 30.0–36.0)
MCV: 88.1 fL (ref 80.0–100.0)
Platelets: 126 10*3/uL — ABNORMAL LOW (ref 150–400)
RBC: 4.8 MIL/uL (ref 4.22–5.81)
RDW: 17.4 % — ABNORMAL HIGH (ref 11.5–15.5)
WBC: 7.5 10*3/uL (ref 4.0–10.5)
nRBC: 0 % (ref 0.0–0.2)

## 2023-01-01 LAB — MAGNESIUM: Magnesium: 1.8 mg/dL (ref 1.7–2.4)

## 2023-01-01 MED ORDER — TAMSULOSIN HCL 0.4 MG PO CAPS
0.4000 mg | ORAL_CAPSULE | Freq: Every day | ORAL | Status: DC
  Administered 2023-01-01: 0.4 mg via ORAL
  Filled 2023-01-01: qty 1

## 2023-01-01 MED ORDER — ACETAMINOPHEN 500 MG PO TABS: 1000.0000 mg | ORAL_TABLET | Freq: Three times a day (TID) | ORAL | Status: AC | PRN

## 2023-01-01 MED ORDER — BETHANECHOL CHLORIDE 10 MG PO TABS: 10.0000 mg | ORAL_TABLET | Freq: Three times a day (TID) | ORAL | Status: AC

## 2023-01-01 MED ORDER — BETHANECHOL CHLORIDE 25 MG PO TABS
25.0000 mg | ORAL_TABLET | Freq: Once | ORAL | Status: AC
  Administered 2023-01-01: 25 mg via ORAL
  Filled 2023-01-01: qty 1

## 2023-01-01 MED ORDER — SULFAMETHOXAZOLE-TRIMETHOPRIM 800-160 MG PO TABS
1.0000 | ORAL_TABLET | Freq: Two times a day (BID) | ORAL | Status: AC
Start: 2023-01-01 — End: 2023-01-04

## 2023-01-01 MED ORDER — ACETAMINOPHEN 325 MG PO TABS
650.0000 mg | ORAL_TABLET | Freq: Four times a day (QID) | ORAL | Status: DC
  Administered 2023-01-01 – 2023-01-02 (×3): 650 mg via ORAL
  Filled 2023-01-01 (×3): qty 2

## 2023-01-01 MED ORDER — POTASSIUM CHLORIDE CRYS ER 20 MEQ PO TBCR
60.0000 meq | EXTENDED_RELEASE_TABLET | Freq: Once | ORAL | Status: AC
  Administered 2023-01-01: 60 meq via ORAL
  Filled 2023-01-01: qty 3

## 2023-01-01 MED ORDER — BETHANECHOL CHLORIDE 10 MG PO TABS
10.0000 mg | ORAL_TABLET | Freq: Three times a day (TID) | ORAL | Status: DC
  Administered 2023-01-01 – 2023-01-02 (×3): 10 mg via ORAL
  Filled 2023-01-01 (×5): qty 1

## 2023-01-01 MED ORDER — TAMSULOSIN HCL 0.4 MG PO CAPS
0.4000 mg | ORAL_CAPSULE | Freq: Once | ORAL | Status: AC
  Administered 2023-01-01: 0.4 mg via ORAL
  Filled 2023-01-01: qty 1

## 2023-01-01 NOTE — Plan of Care (Signed)
Patient alert/oriented X2. Patient could not recall the month or why he went to the hospital. Patient compliant with medication administration and skin assessed with Elisa, RN on admission, no breakdown of skin noted and has some generalized bruising. Patient turned Q2 hours and VSS upon admission. No complaints at this time.   Problem: Fluid Volume: Goal: Hemodynamic stability will improve Outcome: Progressing   Problem: Clinical Measurements: Goal: Diagnostic test results will improve Outcome: Progressing   Problem: Clinical Measurements: Goal: Signs and symptoms of infection will decrease Outcome: Progressing   Problem: Respiratory: Goal: Ability to maintain adequate ventilation will improve Outcome: Progressing   Problem: Education: Goal: Ability to describe self-care measures that may prevent or decrease complications (Diabetes Survival Skills Education) will improve Outcome: Progressing   Problem: Education: Goal: Individualized Educational Video(s) Outcome: Progressing   Problem: Coping: Goal: Ability to adjust to condition or change in health will improve Outcome: Progressing   Problem: Fluid Volume: Goal: Ability to maintain a balanced intake and output will improve Outcome: Progressing   Problem: Health Behavior/Discharge Planning: Goal: Ability to identify and utilize available resources and services will improve Outcome: Progressing   Problem: Health Behavior/Discharge Planning: Goal: Ability to manage health-related needs will improve Outcome: Progressing   Problem: Metabolic: Goal: Ability to maintain appropriate glucose levels will improve Outcome: Progressing   Problem: Nutritional: Goal: Maintenance of adequate nutrition will improve Outcome: Progressing   Problem: Nutritional: Goal: Progress toward achieving an optimal weight will improve Outcome: Progressing   Problem: Skin Integrity: Goal: Risk for impaired skin integrity will  decrease Outcome: Progressing   Problem: Tissue Perfusion: Goal: Adequacy of tissue perfusion will improve Outcome: Progressing   Problem: Education: Goal: Knowledge of General Education information will improve Description: Including pain rating scale, medication(s)/side effects and non-pharmacologic comfort measures Outcome: Progressing   Problem: Health Behavior/Discharge Planning: Goal: Ability to manage health-related needs will improve Outcome: Progressing   Problem: Clinical Measurements: Goal: Ability to maintain clinical measurements within normal limits will improve Outcome: Progressing   Problem: Clinical Measurements: Goal: Will remain free from infection Outcome: Progressing   Problem: Clinical Measurements: Goal: Diagnostic test results will improve Outcome: Progressing   Problem: Clinical Measurements: Goal: Respiratory complications will improve Outcome: Progressing   Problem: Clinical Measurements: Goal: Cardiovascular complication will be avoided Outcome: Progressing   Problem: Activity: Goal: Risk for activity intolerance will decrease Outcome: Progressing   Problem: Nutrition: Goal: Adequate nutrition will be maintained Outcome: Progressing   Problem: Coping: Goal: Level of anxiety will decrease Outcome: Progressing   Problem: Elimination: Goal: Will not experience complications related to bowel motility Outcome: Progressing   Problem: Elimination: Goal: Will not experience complications related to urinary retention Outcome: Progressing   Problem: Pain Managment: Goal: General experience of comfort will improve Outcome: Progressing   Problem: Safety: Goal: Ability to remain free from injury will improve Outcome: Progressing   Problem: Skin Integrity: Goal: Risk for impaired skin integrity will decrease Outcome: Progressing

## 2023-01-01 NOTE — Plan of Care (Signed)
  Problem: Health Behavior/Discharge Planning: Goal: Ability to manage health-related needs will improve Outcome: Progressing   Problem: Clinical Measurements: Goal: Ability to maintain clinical measurements within normal limits will improve Outcome: Progressing Goal: Will remain free from infection Outcome: Progressing Goal: Diagnostic test results will improve Outcome: Progressing Goal: Cardiovascular complication will be avoided Outcome: Progressing   Problem: Nutrition: Goal: Adequate nutrition will be maintained Outcome: Progressing   Problem: Coping: Goal: Level of anxiety will decrease Outcome: Progressing   Problem: Elimination: Goal: Will not experience complications related to bowel motility Outcome: Progressing Goal: Will not experience complications related to urinary retention Outcome: Progressing   Problem: Pain Managment: Goal: General experience of comfort will improve Outcome: Progressing   Problem: Safety: Goal: Ability to remain free from injury will improve Outcome: Progressing   Problem: Skin Integrity: Goal: Risk for impaired skin integrity will decrease Outcome: Progressing   Problem: Education: Goal: Knowledge of General Education information will improve Description: Including pain rating scale, medication(s)/side effects and non-pharmacologic comfort measures Outcome: Not Progressing   Problem: Clinical Measurements: Goal: Respiratory complications will improve Outcome: Not Progressing   Problem: Activity: Goal: Risk for activity intolerance will decrease Outcome: Not Progressing

## 2023-01-01 NOTE — Progress Notes (Signed)
PROGRESS NOTE  [name] QMV:[id number] DOB: 1955-09-23   PCP: Pcp, No  Patient is from: LTC/SNF  DOA: 12/27/2022 LOS: 5  Chief complaints Chief Complaint  Patient presents with   Fatigue     Brief Narrative / Interim history: 66 year old M with PMH of dementia, IDDM-2, HTN, HLD and mood disorder brought to ED from SNF due to confusion, lethargy, nausea, dysuria for about 4 days and admitted for severe sepsis due to Staph aureus UTI.  He also had diarrhea.  History is limited due to patient's underlying cognitive deficit.  In ED, tachycardic to 120s and tachypneic to 27.  WBC 19.3 with left shift.  Hgb 18.5. Cr 1.5.  Slightly elevated liver enzymes.  COVID-19, influenza and RSV PCR nonreactive.  Lactic acid 2.3.  CXR without acute finding.  CT abdomen and pelvis showed severe chronic right hydronephrosis, bladder wall thickening, cholelithiasis, hepatic steatosis, CAD, questionable groundglass opacity in LLL and aortic atherosclerosis.  Urinalysis was pending.  INR was elevated to 9.4.  Lactic acid was 2.3 and up to 15 on repeat which was felt to be lab error.  Cultures collected.  Patient was started on broad-spectrum antibiotics with vancomycin and cefepime and admitted severe sepsis likely due to UTI, dehydration, elevated liver enzymes and delirium.  Patient was continued on broad-spectrum antibiotics.  Blood cultures NGTD.  GIP and C. difficile negative.  Urine culture with MRSA.  Antibiotic de-escalated to Zyvox.    Hospital course complicated by acute urinary retention requiring Foley catheter.   On the day of discharge, sepsis physiology resolved.  Mental status improved.  He is awake and alert and oriented to self and place but not time.  Subjective: Seen and examined earlier this morning.  No major events overnight of this morning.  No complaints but not a great historian.  He is awake and alert but only oriented to self and place.  He is not oriented to time.  A little  confused about situation.  Objective: Vitals:   01/01/23 0010 01/01/23 0410 01/01/23 0750 01/01/23 1158  BP: 131/80 117/81 (!) 145/100 (!) 149/86  Pulse: (!) 54 61 62 60  Resp: 16 14 17 18   Temp: 98.5 F (36.9 C) 98.3 F (36.8 C) (!) 97.5 F (36.4 C) 98.7 F (37.1 C)  TempSrc: Oral Oral Oral Oral  SpO2: 93% 92% 94% 98%  Weight:      Height:        Examination:  GENERAL: No apparent distress.  Nontoxic. HEENT: MMM.  Vision and hearing grossly intact.  NECK: Supple.  No apparent JVD.  RESP:  No IWOB.  Fair aeration bilaterally. CVS:  RRR. Heart sounds normal.  ABD/GI/GU: BS+. Abd soft, NTND.  Foley in place. MSK/EXT:  Moves extremities.  Bilateral extremity weakness.  No edema. SKIN: no apparent skin lesion or wound NEURO: Awake and alert.  Oriented to self and place but not time.  RLE 3/5.  LLE 3+/5. PSYCH: Calm. Normal affect.   Procedures:  None  Microbiology summarized: COVID-19, influenza and RSV PCR nonreactive C. difficile and GIP nonreactive Blood cultures NGTD Urine culture with MRSA  Assessment and plan: Severe sepsis due due to Staph aureus UTI-urinalysis with pyuria, he also reported dysuria for the past several days.  CT with severe chronic right hydronephrosis with reimplantation of right ureter and bladder wall thickening and possible bladder outlet obstruction with enlarged prostate.  Urine cultures with MRSA.  Previous attending discussed case with ID, Dr. Algis Liming who recommended TTE to  exclude vegetation.  Antibiotics de-escalated to linezolid. -Continue linezolid   Acute metabolic encephalopathy-resolved.  Awake and alert.  Oriented to self and place but not time.  A little confused about situation.  Patient has underlying dementia. -Reorientation and delirium precaution -Minimize or avoid sedating medications -Discontinue Percocet.  Tylenol every 6 hours while awake.  IDDM-2 with hyperglycemia: A1c 7.0%. Recent Labs  Lab 12/31/22 1145  12/31/22 1628 12/31/22 2101 01/01/23 0632 01/01/23 1201  GLUCAP 147* 130* 136* 120* 139*  -Continue current insulin regimen  Acute urinary retention/severe chronic hydronephrosis with reimplantation of right ureter-likely in the setting of UTI, continue Foley catheter.  Seems like he has chronic urinary retention.  He has chronic right hydronephrosis.  -Start Flomax and bethanechol -Voiding trial.  -Strict intake and output.    Dysphagia, concern for aspiration: Cleared for regular diet by SLP. -Aspiration precaution   Diarrhea-C. difficile and GI pathogen panel negative.  Resolved.  Anion gap metabolic acidosis-due to lactic acidosis.  Resolved.   Hypokalemia, hypomagnesemia- -monitor replenish as appropriate.   LFT elevation -mild, likely in the setting of severe sepsis.  RUQ [place] without significant finding.  Resolved.   AKI versus CKD-3A: Likely from dehydration and severe sepsis.  Renal function improved.   Thrombocytopenia: Likely due to sepsis.  Improving.   Essential hypertension -blood pressure improved. -resume home meds.   CAD, prior non-STEMI -continue aspirin and Plavix   Hyperlipidemia-continue statin  Mood disorder: Stable. -Resume home meds   Obesity Body mass index is 32.28 kg/m.          DVT prophylaxis:  SCDs Start: 12/27/22 2254  Code Status: Full code Family Communication: None at the bedside Level of care: Med-Surg Status is: Inpatient Remains inpatient appropriate because: MRSA UTI.  Needs echocardiogram per ID.   Final disposition: SNF Consultants:  Infectious disease over the phone  55 minutes with more than 50% spent in reviewing records, counseling patient/family and coordinating care.   Sch Meds:  Scheduled Meds:  acetaminophen  650 mg Oral Q6H WA   aspirin  81 mg Oral Daily   atorvastatin  40 mg Oral QHS   bethanechol  25 mg Oral Once   Followed by   bethanechol  10 mg Oral TID   Chlorhexidine Gluconate Cloth  6 each  Topical Q0600   Chlorhexidine Gluconate Cloth  6 each Topical Daily   clopidogrel  75 mg Oral Daily   docusate  100 mg Oral BID   insulin aspart  0-5 Units Subcutaneous QHS   insulin aspart  0-9 Units Subcutaneous TID WC   linezolid  600 mg Oral Q12H   memantine  10 mg Oral QHS   mupirocin ointment  1 Application Nasal BID   OLANZapine zydis  2.5 mg Oral BID   tamsulosin  0.4 mg Oral Once   Followed by   tamsulosin  0.4 mg Oral QPC supper   valproic acid  250 mg Oral BID   Continuous Infusions: PRN Meds:.hydrALAZINE, lip balm, LORazepam, ondansetron (ZOFRAN) IV, polyethylene glycol  Antimicrobials: Anti-infectives (From admission, onward)    Start     Dose/Rate Route Frequency Ordered Stop   01/01/23 0000  sulfamethoxazole-trimethoprim (BACTRIM DS) 800-160 MG tablet        1 tablet Oral 2 times daily 01/01/23 1200 01/04/23 2359   12/30/22 1115  linezolid (ZYVOX) tablet 600 mg        600 mg Oral Every 12 hours 12/30/22 1027     12/29/22 1800  vancomycin (VANCOREADY)  IVPB 1500 mg/300 mL  Status:  Discontinued        1,500 mg 150 mL/hr over 120 Minutes Intravenous Every 24 hours 12/29/22 1008 12/30/22 1027   12/28/22 1900  vancomycin (VANCOREADY) IVPB 1500 mg/300 mL        1,500 mg 150 mL/hr over 120 Minutes Intravenous  Once 12/28/22 0200 12/28/22 2102   12/28/22 1000  metroNIDAZOLE (FLAGYL) IVPB 500 mg  Status:  Discontinued        500 mg 100 mL/hr over 60 Minutes Intravenous Every 12 hours 12/27/22 2257 12/29/22 1604   12/28/22 0400  ceFEPIme (MAXIPIME) 2 g in sodium chloride 0.9 % 100 mL IVPB  Status:  Discontinued        2 g 200 mL/hr over 30 Minutes Intravenous Every 8 hours 12/28/22 0149 12/29/22 1604   12/27/22 1845  ceFEPIme (MAXIPIME) 2 g in sodium chloride 0.9 % 100 mL IVPB        2 g 200 mL/hr over 30 Minutes Intravenous  Once 12/27/22 1841 12/27/22 2042   12/27/22 1845  metroNIDAZOLE (FLAGYL) IVPB 500 mg        500 mg 100 mL/hr over 60 Minutes Intravenous  Once  12/27/22 1841 12/27/22 2156   12/27/22 1845  vancomycin (VANCOCIN) IVPB 1000 mg/200 mL premix  Status:  Discontinued        1,000 mg 200 mL/hr over 60 Minutes Intravenous  Once 12/27/22 1841 12/27/22 1844   12/27/22 1845  vancomycin (VANCOREADY) IVPB 2000 mg/400 mL        2,000 mg 200 mL/hr over 120 Minutes Intravenous  Once 12/27/22 1844 12/27/22 2319        I have personally reviewed the following labs and images: CBC: Recent Labs  Lab 12/27/22 1930 12/28/22 0055 12/28/22 0446 12/29/22 0235 12/30/22 0230 12/31/22 0226 01/01/23 0255  WBC 19.3* 14.4*  --  9.4 11.6* 10.1 7.5  NEUTROABS 17.0*  --   --   --   --   --   --   HGB 18.5* 14.5 15.3 16.1 16.0 14.0 13.9  HCT 55.8* 44.8 45.0 49.1 47.5 43.0 42.3  MCV 84.5 86.2  --  84.1 85.7 86.0 88.1  PLT 237 159  --  124* 152 134* 126*   BMP &GFR Recent Labs  Lab 12/28/22 0055 12/28/22 0446 12/29/22 0235 12/30/22 0230 12/31/22 0226 01/01/23 0255  NA 136 139 138 142 143 141  K 3.8 3.2* 3.2* 3.0* 3.1* 3.4*  CL 106  --  106 110 112* 109  CO2 16*  --  21* 20* 22 23  GLUCOSE 121*  --  131* 98 125* 159*  BUN 31*  --  26* 18 17 13   CREATININE 1.25*  --  1.10 0.94 1.07 0.95  CALCIUM 7.8*  --  8.2* 8.5* 8.5* 8.3*  MG  --   --  1.5* 1.9 1.8 1.8   Estimated Creatinine Clearance: 78.5 mL/min (by C-G formula based on SCr of 0.95 mg/dL). Liver & Pancreas: Recent Labs  Lab 12/28/22 0055 12/29/22 0235 12/30/22 0230 12/31/22 0226 01/01/23 0255  AST 76* 72* 57* 40 40  ALT 75* 74* 65* 50* 44  ALKPHOS 198* 202* 207* 177* 189*  BILITOT 1.2 1.5* 1.0 0.9 0.8  PROT 5.0* 5.6* 6.1* 5.6* 5.3*  ALBUMIN 1.9* 2.2* 2.5* 2.1* 2.1*   No results for input(s): "LIPASE", "AMYLASE" in the last 168 hours. Recent Labs  Lab 12/28/22 0040  AMMONIA 32   Diabetic: No results for input(s): "HGBA1C"  in the last 72 hours. Recent Labs  Lab 12/31/22 1145 12/31/22 1628 12/31/22 2101 01/01/23 0632 01/01/23 1201  GLUCAP 147* 130* 136* 120* 139*    Cardiac Enzymes: No results for input(s): "CKTOTAL", "CKMB", "CKMBINDEX", "TROPONINI" in the last 168 hours. No results for input(s): "PROBNP" in the last 8760 hours. Coagulation Profile: Recent Labs  Lab 12/27/22 2153  INR 9.4*   Thyroid Function Tests: No results for input(s): "TSH", "T4TOTAL", "FREET4", "T3FREE", "THYROIDAB" in the last 72 hours. Lipid Profile: No results for input(s): "CHOL", "HDL", "LDLCALC", "TRIG", "CHOLHDL", "LDLDIRECT" in the last 72 hours. Anemia Panel: No results for input(s): "VITAMINB12", "FOLATE", "FERRITIN", "TIBC", "IRON", "RETICCTPCT" in the last 72 hours. Urine analysis:    Component Value Date/Time   COLORURINE YELLOW 12/28/2022 0348   APPEARANCEUR CLOUDY (A) 12/28/2022 0348   APPEARANCEUR Clear 12/12/2020 1046   LABSPEC 1.045 (H) 12/28/2022 0348   PHURINE 5.0 12/28/2022 0348   GLUCOSEU 50 (A) 12/28/2022 0348   HGBUR SMALL (A) 12/28/2022 0348   BILIRUBINUR NEGATIVE 12/28/2022 0348   BILIRUBINUR Negative 12/12/2020 1046   KETONESUR 20 (A) 12/28/2022 0348   PROTEINUR 100 (A) 12/28/2022 0348   UROBILINOGEN 0.2 08/26/2019 1516   UROBILINOGEN 0.2 04/18/2008 1153   NITRITE POSITIVE (A) 12/28/2022 0348   LEUKOCYTESUR LARGE (A) 12/28/2022 0348   Sepsis Labs: Invalid input(s): "PROCALCITONIN", "LACTICIDVEN"  Microbiology: Recent Results (from the past 240 hour(s))  Resp panel by RT-PCR (RSV, Flu A&B, Covid) Anterior Nasal Swab     Status: None   Collection Time: 12/27/22  6:41 PM   Specimen: Anterior Nasal Swab  Result Value Ref Range Status   SARS Coronavirus 2 by RT PCR NEGATIVE NEGATIVE Final   Influenza A by PCR NEGATIVE NEGATIVE Final   Influenza B by PCR NEGATIVE NEGATIVE Final    Comment: (NOTE) The Xpert Xpress SARS-CoV-2/FLU/RSV plus assay is intended as an aid in the diagnosis of influenza from Nasopharyngeal swab specimens and should not be used as a sole basis for treatment. Nasal washings and aspirates are unacceptable for  Xpert Xpress SARS-CoV-2/FLU/RSV testing.  Fact Sheet for Patients: BloggerCourse.com  Fact Sheet for Healthcare Providers: SeriousBroker.it  This test is not yet approved or cleared by the [place] FDA and has been authorized for detection and/or diagnosis of SARS-CoV-2 by FDA under an Emergency Use Authorization (EUA). This EUA will remain in effect (meaning this test can be used) for the duration of the COVID-19 declaration under Section 564(b)(1) of the Act, 21 U.S.C. section 360bbb-3(b)(1), unless the authorization is terminated or revoked.     Resp Syncytial Virus by PCR NEGATIVE NEGATIVE Final    Comment: (NOTE) Fact Sheet for Patients: BloggerCourse.com  Fact Sheet for Healthcare Providers: SeriousBroker.it  This test is not yet approved or cleared by the [place] FDA and has been authorized for detection and/or diagnosis of SARS-CoV-2 by FDA under an Emergency Use Authorization (EUA). This EUA will remain in effect (meaning this test can be used) for the duration of the COVID-19 declaration under Section 564(b)(1) of the Act, 21 U.S.C. section 360bbb-3(b)(1), unless the authorization is terminated or revoked.  Performed at [facility], [address], [place], [place] [address]   Blood Culture (routine x 2)     Status: None   Collection Time: 12/27/22  7:50 PM   Specimen: BLOOD  Result Value Ref Range Status   Specimen Description BLOOD RIGHT ANTECUBITAL  Final   Special Requests   Final    BOTTLES DRAWN  AEROBIC AND ANAEROBIC Blood Culture adequate volume   Culture   Final    NO GROWTH 5 DAYS Performed at [facility], [address] [place], [place] [address]    Report Status 01/01/2023 FINAL  Final  Blood Culture (routine x 2)     Status: None   Collection Time: 12/27/22  7:50 PM   Specimen: BLOOD  Result Value Ref Range Status    Specimen Description BLOOD RIGHT ANTECUBITAL  Final   Special Requests   Final    BOTTLES DRAWN AEROBIC AND ANAEROBIC Blood Culture results may not be optimal due to an inadequate volume of blood received in culture bottles   Culture   Final    NO GROWTH 5 DAYS Performed at [facility], [address] [place], [place] [address]    Report Status 01/01/2023 FINAL  Final  Urine Culture     Status: Abnormal   Collection Time: 12/28/22  3:48 AM   Specimen: Urine, Random  Result Value Ref Range Status   Specimen Description URINE, RANDOM  Final   Special Requests   Final    NONE Reflexed from [id number] Performed at [facility], [address], [place], [place] [address]    Culture (A)  Final    >=100,000 COLONIES/mL METHICILLIN RESISTANT STAPHYLOCOCCUS AUREUS   Report Status 12/30/2022 FINAL  Final   Organism ID, Bacteria METHICILLIN RESISTANT STAPHYLOCOCCUS AUREUS (A)  Final      Susceptibility   Methicillin resistant staphylococcus aureus - MIC*    CIPROFLOXACIN >=8 RESISTANT Resistant     GENTAMICIN <=0.5 SENSITIVE Sensitive     NITROFURANTOIN <=16 SENSITIVE Sensitive     OXACILLIN >=4 RESISTANT Resistant     TETRACYCLINE <=1 SENSITIVE Sensitive     VANCOMYCIN 1 SENSITIVE Sensitive     TRIMETH/SULFA <=10 SENSITIVE Sensitive     CLINDAMYCIN <=0.25 SENSITIVE Sensitive     RIFAMPIN <=0.5 SENSITIVE Sensitive     Inducible Clindamycin NEGATIVE Sensitive     LINEZOLID 2 SENSITIVE Sensitive     * >=100,000 COLONIES/mL METHICILLIN RESISTANT STAPHYLOCOCCUS AUREUS  C Difficile Quick Screen w PCR reflex     Status: None   Collection Time: 12/28/22  4:07 PM   Specimen: STOOL  Result Value Ref Range Status   C Diff antigen NEGATIVE NEGATIVE Final   C Diff toxin NEGATIVE NEGATIVE Final   C Diff interpretation No C. difficile detected.  Final    Comment: Performed at [facility], [address] [place], [place] [address]  Gastrointestinal Panel by PCR , Stool     Status:  None   Collection Time: 12/28/22  4:07 PM   Specimen: Stool  Result Value Ref Range Status   Campylobacter species NOT DETECTED NOT DETECTED Final   Plesimonas shigelloides NOT DETECTED NOT DETECTED Final   Salmonella species NOT DETECTED NOT DETECTED Final   Yersinia enterocolitica NOT DETECTED NOT DETECTED Final   Vibrio species NOT DETECTED NOT DETECTED Final   Vibrio cholerae NOT DETECTED NOT DETECTED Final   Enteroaggregative E coli (EAEC) NOT DETECTED NOT DETECTED Final   Enteropathogenic E coli (EPEC) NOT DETECTED NOT DETECTED Final   Enterotoxigenic E coli (ETEC) NOT DETECTED NOT DETECTED Final   Shiga like toxin producing E coli (STEC) NOT DETECTED NOT DETECTED Final   Shigella/Enteroinvasive E coli (EIEC) NOT DETECTED NOT DETECTED Final   Cryptosporidium NOT DETECTED NOT DETECTED Final   Cyclospora cayetanensis NOT DETECTED NOT DETECTED Final  Entamoeba histolytica NOT DETECTED NOT DETECTED Final   Giardia lamblia NOT DETECTED NOT DETECTED Final   Adenovirus F40/41 NOT DETECTED NOT DETECTED Final   Astrovirus NOT DETECTED NOT DETECTED Final   Norovirus GI/GII NOT DETECTED NOT DETECTED Final   Rotavirus A NOT DETECTED NOT DETECTED Final   Sapovirus (I, II, IV, and V) NOT DETECTED NOT DETECTED Final    Comment: Performed at [facility], [address] [place], [place] [address]  MRSA Next Gen by PCR, Nasal     Status: Abnormal   Collection Time: 12/30/22 12:25 PM   Specimen: Nasal Mucosa; Nasal Swab  Result Value Ref Range Status   MRSA by PCR Next Gen DETECTED (A) NOT DETECTED Final    Comment: RESULT CALLED TO, READ BACK BY AND VERIFIED WITH: RN SARA.K AT 1405 ON 12/30/2022 BY T.SAAD. (NOTE) The GeneXpert MRSA Assay (FDA approved for NASAL specimens only), is one component of a comprehensive MRSA colonization surveillance program. It is not intended to diagnose MRSA infection nor to guide or monitor treatment for MRSA infections. Test performance is not FDA  approved in patients less than 56 years old. Performed at [facility], [address], [place], [place] [address]     Radiology Studies: No results found.    Geo Slone T. Barri Neidlinger [facility]  If 7PM-7AM, please contact night-coverage [email or web address] 01/01/2023, 12:20 PM

## 2023-01-01 NOTE — Progress Notes (Signed)
Speech Language Pathology Treatment: Dysphagia  Patient Details Name: [name] MRN: [id number] DOB: [birth year] Today's Date: 01/01/2023 Time: 4098-1191 SLP Time Calculation (min) (ACUTE ONLY): 12 min  Assessment / Plan / Recommendation Clinical Impression  Pt observed with thin liquids and regular solids following completion of MBS last date. Provided education regarding drinking via cup only to control bolus size. Pt continues to have a delayed cough, although not as severe as noted during initial swallowing evaluation. Also note pt coughed at times that were not representative of penetration/aspiration on MBS. Recommend continuing current diet of regular texture solids with thin liquids and full supervision to control rate and size of bolus. No further ST needs identified acutely, although encourage f/u with SLP at next venue of care for further dysphagia management.    HPI HPI: Pt is a 67 yo. male with PMH including HTN, HLD, IDDM. Presenting to ED 10/11 with three day history of AMS, abdominal discomfort, and nausea.  He also reports dysuria for the past 4 days. Head CT negative. CXR no active disease. Seen by SLP 12/28/22 with no overt s/s of dysphagia or aspiration but with recommendations to resume full liquid diet given frequent emesis and ability to upgrade per MD discretion. SLP received new orders to evaluate swallowing due to RN reports of frequent coughing with med adminsitration and during meals with desaturations.      SLP Plan  All goals met      Recommendations for follow up therapy are one component of a multi-disciplinary discharge planning process, led by the attending physician.  Recommendations may be updated based on patient status, additional functional criteria and insurance authorization.    Recommendations  Diet recommendations: Regular;Thin liquid Liquids provided via: Cup;No straw Medication Administration: Whole meds with puree Supervision: Patient able to  self feed;Full supervision/cueing for compensatory strategies Compensations: Slow rate;Small sips/bites Postural Changes and/or Swallow Maneuvers: Seated upright 90 degrees                  Oral care BID   Frequent or constant Supervision/Assistance Dysphagia, pharyngeal phase (R13.13)     All goals met     Gwynneth Aliment, M.A., CF-SLP Speech Language Pathology, Acute Rehabilitation Services  Secure Chat preferred [phone]   01/01/2023, 4:06 PM

## 2023-01-02 DIAGNOSIS — A419 Sepsis, unspecified organism: Secondary | ICD-10-CM | POA: Diagnosis not present

## 2023-01-02 DIAGNOSIS — R6520 Severe sepsis without septic shock: Secondary | ICD-10-CM | POA: Diagnosis not present

## 2023-01-02 LAB — CBC
HCT: 46.2 % (ref 39.0–52.0)
Hemoglobin: 15.1 g/dL (ref 13.0–17.0)
MCH: 28.5 pg (ref 26.0–34.0)
MCHC: 32.7 g/dL (ref 30.0–36.0)
MCV: 87.3 fL (ref 80.0–100.0)
Platelets: 123 10*3/uL — ABNORMAL LOW (ref 150–400)
RBC: 5.29 MIL/uL (ref 4.22–5.81)
RDW: 17.1 % — ABNORMAL HIGH (ref 11.5–15.5)
WBC: 7.6 10*3/uL (ref 4.0–10.5)
nRBC: 0 % (ref 0.0–0.2)

## 2023-01-02 LAB — RENAL FUNCTION PANEL
Albumin: 2.1 g/dL — ABNORMAL LOW (ref 3.5–5.0)
Anion gap: 10 (ref 5–15)
BUN: 8 mg/dL (ref 8–23)
CO2: 24 mmol/L (ref 22–32)
Calcium: 8.4 mg/dL — ABNORMAL LOW (ref 8.9–10.3)
Chloride: 105 mmol/L (ref 98–111)
Creatinine, Ser: 0.91 mg/dL (ref 0.61–1.24)
GFR, Estimated: >60 mL/min (ref >60)
Glucose, Bld: 116 mg/dL — ABNORMAL HIGH (ref 70–99)
Phosphorus: 1.8 mg/dL — ABNORMAL LOW (ref 2.5–4.6)
Potassium: 4.7 mmol/L (ref 3.5–5.1)
Sodium: 139 mmol/L (ref 135–145)

## 2023-01-02 LAB — GLUCOSE, CAPILLARY
Glucose-Capillary: 104 mg/dL — ABNORMAL HIGH (ref 70–99)
Glucose-Capillary: 137 mg/dL — ABNORMAL HIGH (ref 70–99)

## 2023-01-02 LAB — MAGNESIUM: Magnesium: 1.6 mg/dL — ABNORMAL LOW (ref 1.7–2.4)

## 2023-01-02 MED ORDER — MAGNESIUM SULFATE 2 GM/50ML IV SOLN
2.0000 g | Freq: Once | INTRAVENOUS | Status: AC
  Administered 2023-01-02: 2 g via INTRAVENOUS
  Filled 2023-01-02: qty 50

## 2023-01-02 MED ORDER — SODIUM PHOSPHATES 45 MMOLE/15ML IV SOLN
30.0000 mmol | Freq: Once | INTRAVENOUS | Status: DC
  Filled 2023-01-02: qty 10

## 2023-01-02 MED ORDER — K PHOS MONO-SOD PHOS DI & MONO 155-852-130 MG PO TABS: 500.0000 mg | ORAL_TABLET | Freq: Four times a day (QID) | ORAL | Status: AC

## 2023-01-02 MED ORDER — TAMSULOSIN HCL 0.4 MG PO CAPS: 0.4000 mg | ORAL_CAPSULE | Freq: Every day | ORAL | Status: DC

## 2023-01-02 MED ORDER — K PHOS MONO-SOD PHOS DI & MONO 155-852-130 MG PO TABS
500.0000 mg | ORAL_TABLET | Freq: Four times a day (QID) | ORAL | Status: DC
  Administered 2023-01-02: 500 mg via ORAL
  Filled 2023-01-02: qty 2

## 2023-01-02 NOTE — Progress Notes (Signed)
Report called to Bibi at [facility] at 1155.

## 2023-01-02 NOTE — TOC Transition Note (Signed)
Transition of Care [facility]) - CM/SW Discharge Note   Patient Details  Name: [name] MRN: [id number] Date of Birth: [birth year]  Transition of Care [facility]) CM/SW Contact:  Shelsy Seng A [place], LCSWA Phone Number: 01/02/2023, 10:49 AM   Clinical Narrative:     Patient will DC to: [facility]   Anticipated DC date: 01/02/23  Family notified: Ignacia Bayley  Transport by: Sharin Mons      Per MD patient ready for DC to [facility] for Long Term Care. RN, patient, patient's family, and facility notified of DC. Discharge Summary and FL2 sent to facility. RN to call report prior to discharge [phone]). DC packet on chart. Ambulance transport requested for patient.     CSW will sign off for now as social work intervention is no longer needed. Please consult [place] again if new needs arise.   Final next level of care: Long Term Nursing Home Barriers to Discharge: Barriers Resolved   Patient Goals and CMS Choice      Discharge Placement                Patient chooses bed at:  [facility] for LTC) Patient to be transferred to facility by: [facility] Name of family member notified: Ignacia Bayley Patient and family notified of of transfer: 01/02/23  Discharge Plan and Services Additional resources added to the After Visit Summary for   In-house Referral: Clinical Social Work   Post Acute Care Choice: Skilled Nursing Facility                               Social Determinants of Health (SDOH) Interventions SDOH Screenings   Food Insecurity: No Food Insecurity (12/30/2022)  Housing: Low Risk  (12/30/2022)  Transportation Needs: No Transportation Needs (12/30/2022)  Utilities: Not At Risk (12/30/2022)  Depression (PHQ2-9): High Risk (12/06/2020)  Financial Resource Strain: Low Risk  (07/24/2021)   Received from [facility], [facility]  Physical Activity: Insufficiently Active (08/26/2019)  Social Connections: Unknown (07/24/2021)   [facility] from [facility], [facility]  Stress: Stress Concern Present (07/24/2021)   Received from [facility], [facility]  Tobacco Use: Low Risk  (12/27/2022)     Readmission Risk Interventions    10/31/2021    2:47 PM  Readmission Risk Prevention Plan  Transportation Screening Complete  Medication Review (RN Care Manager) Complete  PCP or Specialist appointment within 3-5 days of discharge Complete  SW Recovery Care/Counseling Consult Complete  Palliative Care Screening Not Applicable  Skilled Nursing Facility Complete

## 2023-01-02 NOTE — Discharge Summary (Addendum)
Physician Discharge Summary  [name] ZOX:[id number] DOB: 13-Apr-1955 DOA: 12/27/2022  PCP: Oneita Hurt, No  Admit date: 12/27/2022 Discharge date: 01/02/2023 Admitted From: LTC/SNF Disposition: LTC/SNF Recommendations for Outpatient Follow-up:  Recommend ambulatory referral to urology for urinary retention, chronic right hydronephrosis and BPH Renal ultrasound in 2 to 3 weeks CMP and CBC in 1 week Please follow up on the following pending results: None   Discharge Condition: Stable CODE STATUS: Full code   Hospital course 67 year old M with PMH of dementia, IDDM-2, HTN, HLD and mood disorder brought to ED from SNF due to confusion, lethargy, nausea, dysuria for about 4 days and admitted for severe sepsis due to Staph aureus UTI.  He also had diarrhea.  History is limited due to patient's underlying cognitive deficit.   In ED, tachycardic to 120s and tachypneic to 27.  WBC 19.3 with left shift.  Hgb 18.5. Cr 1.5.  Slightly elevated liver enzymes.  COVID-19, influenza and RSV PCR nonreactive.  Lactic acid 2.3.  CXR without acute finding.  CT abdomen and pelvis showed severe chronic right hydronephrosis, bladder wall thickening, cholelithiasis, hepatic steatosis, CAD, questionable groundglass opacity in LLL and aortic atherosclerosis.  Urinalysis was pending.  INR was elevated to 9.4.  Lactic acid was 2.3 and up to 15 on repeat which was felt to be lab error.  Cultures collected.  Patient was started on broad-spectrum antibiotics with vancomycin and cefepime and admitted severe sepsis likely due to UTI, dehydration, elevated liver enzymes and delirium.   Patient was continued on broad-spectrum antibiotics.  Blood cultures NGTD.  GIP and C. difficile negative.  Urine culture with MRSA.  Antibiotic de-escalated to Zyvox.  ID recommended echocardiogram which did not show significant finding of vegetation.  Cleared for discharge by ID.  He is discharged on Bactrim DS for 3 more days to complete  treatment course.   Hospital course complicated by acute urinary retention requiring Foley catheter.  He has chronic right hydronephrosis and BPH on CT.  He was  started on bethanechol and Flomax.  Failed voiding trial on 01/01/2023.  Discharged with Foley catheter.  Recommend renal ultrasound in 2 to 3 weeks and ambulatory referral to urology.  See individual problem list below for more.   Problems addressed during this hospitalization Severe sepsis due due to Staph aureus UTI-urinalysis with pyuria, he also reported dysuria for the past several days.  CT with severe chronic right hydronephrosis with reimplantation of right ureter and bladder wall thickening and possible bladder outlet obstruction with enlarged prostate.  Urine cultures with MRSA.  Previous attending discussed case with ID, Dr. Algis Liming who recommended TTE which is negative for vegetation.  Antibiotics de-escalated to linezolid, and discharged on p.o. Bactrim for 3 more days to complete a total of 8 days course.   Acute metabolic encephalopathy-resolved.  Awake and alert.  Oriented to self and place but not time.  A little confused about situation.  Patient has underlying dementia. -Reorientation and delirium precaution -Minimize or avoid sedating medications -Discontinued opiate.  Scheduled Tylenol for pain control.   IDDM-2 with hyperglycemia: A1c 7.0%. -Continue home regimen.  Acute urinary retention/severe chronic hydronephrosis with reimplantation of right ureter-likely in the setting of UTI, continue Foley catheter.  Seems like he has chronic urinary retention.  He has chronic right hydronephrosis.  Started on bethanechol and Flomax.  Failed voiding trial on 10/16.  Discharged with Foley catheter. -Renal ultrasound in 2 to 3 weeks -Recommend ambulatory referral to urology   Dysphagia, concern  topically as needed (skin irritation to bilateral inner thighs and groin area).   divalproex 250 MG 24 hr tablet Commonly known as: DEPAKOTE ER Take 250 mg by mouth 2 (two) times daily.   Farxiga 10 MG Tabs tablet Generic drug: dapagliflozin propanediol Take 1 tablet (10 mg total) by mouth daily.   finasteride 5 MG tablet Commonly known as: PROSCAR Take 1 tablet (5 mg total) by mouth daily.   insulin lispro 100 UNIT/ML KwikPen Commonly known as: HUMALOG Inject 4 Units into the skin 3 (three) times daily before meals.   Lantus SoloStar 100 UNIT/ML Solostar Pen Generic drug: insulin glargine Inject 20 Units into the skin daily.   LORazepam 0.5 MG tablet Commonly known as: ATIVAN Take 1 tablet (0.5 mg total) by mouth every 8 (eight) hours as needed for anxiety. What changed:  how much to take when to take this   memantine 10 MG tablet Commonly known as: NAMENDA Take 10 mg  by mouth at bedtime.   metoprolol succinate 25 MG 24 hr tablet Commonly known as: TOPROL-XL Take 1 tablet (25 mg total) by mouth daily.   nystatin powder Commonly known as: MYCOSTATIN/NYSTOP Apply 1 Application topically 3 (three) times daily. To groin   OLANZapine 2.5 MG tablet Commonly known as: ZYPREXA Take 2.5 mg by mouth 2 (two) times daily.   phosphorus 155-852-130 MG tablet Commonly known as: K PHOS NEUTRAL Take 2 tablets (500 mg total) by mouth 4 (four) times daily for 5 days.   sertraline 50 MG tablet Commonly known as: ZOLOFT Take 1 tablet (50 mg total) by mouth daily. What changed: how much to take   sulfamethoxazole-trimethoprim 800-160 MG tablet Commonly known as: Bactrim DS Take 1 tablet by mouth 2 (two) times daily for 3 days.   tamsulosin 0.4 MG Caps capsule Commonly known as: FLOMAX Take 1 capsule (0.4 mg total) by mouth daily after supper.        Consultations: Infectious disease  Procedures/Studies:   ECHOCARDIOGRAM COMPLETE  Result Date: 01/01/2023    ECHOCARDIOGRAM REPORT   Patient Name:   [name] Date of Exam: 01/01/2023 Medical Rec #:  [id number]    Height:       66.0 in Accession #:    [id number]   Weight:       200.0 lb Date of Birth:  [birth year]    BSA:          2.000 m Patient Age:    66 years     BP:           140/83 mmHg Patient Gender: M            HR:           59 bpm. Exam Location:  Inpatient Procedure: 2D Echo, Color Doppler and Cardiac Doppler Indications:    Bacteremia  History:        Patient has prior history of Echocardiogram examinations, most                 recent 11/08/2021. Signs/Symptoms:Bacteremia; Risk                 Factors:Hypertension, Diabetes and Dyslipidemia.  Sonographer:    Milbert Coulter Referring Phys: 43 Daylene Katayama [facility]  Sonographer Comments: No subcostal window. Image acquisition challenging due to uncooperative patient. IMPRESSIONS  1. Left ventricular ejection fraction, by estimation, is 60 to 65%. The left  ventricle has normal function. The left ventricle has no regional wall motion abnormalities. Left ventricular  2.55 cm AV Vmax:           159.00 cm/s AV Vmean:          106.000 cm/s AV VTI:            0.329 m AV Peak Grad:      10.1 mmHg AV Mean Grad:      5.0 mmHg LVOT Vmax:         127.00 cm/s LVOT Vmean:        86.500 cm/s LVOT VTI:          0.267 m LVOT/AV VTI ratio: 0.81  AORTA Ao Root diam: 3.40 cm MITRAL VALVE MV Area (PHT): 3.27 cm    SHUNTS MV Decel Time: 232 msec    Systemic VTI:  0.27 m MV E velocity: 68.10 cm/s  Systemic Diam: 2.00 cm MV A velocity: 92.20 cm/s MV E/A ratio:  0.74 Lennie Odor MD Electronically signed by Lennie Odor MD Signature Date/Time: 01/01/2023/8:54:38 PM    Final    DG CHEST PORT 1 VIEW  Result Date:  12/31/2022 CLINICAL DATA:  Cough EXAM: PORTABLE CHEST 1 VIEW COMPARISON:  12/27/2022 FINDINGS: Low lung volumes. Bandlike atelectasis in the left perihilar region and left base. Stable cardiomediastinal silhouette. No pneumothorax. IMPRESSION: Low lung volumes with bandlike atelectasis in the left perihilar region and left base. Electronically Signed   By: Jasmine Pang M.D.   On: 12/31/2022 15:55   DG Swallowing Func-Speech Pathology  Result Date: 12/31/2022 Table formatting from the original result was not included. Modified Barium Swallow Study Patient Details Name: TAJEE SAVANT MRN: [id number] Date of Birth: Aug 13, 1955 Today's Date: 12/31/2022 HPI/PMH: HPI: Pt is a 67 yo. male with PMH including HTN, HLD, IDDM. Presenting to ED 10/11 with three day history of AMS, abdominal discomfort, and nausea.  He also reports dysuria for the past 4 days. Head CT negative. CXR no active disease. Seen by SLP 12/28/22 with no overt s/s of dysphagia or aspiration but with recommendations to resume full liquid diet given frequent emesis and ability to upgrade per MD discretion. SLP received new orders to evaluate swallowing due to RN reports of frequent coughing with med adminsitration and during meals with desaturations. Clinical Impression: Clinical Impression: Patient presents with a mild oropharyngeal dysphagia characterized primarily by intermittently delayed swallow initiation to the level of the pyriform sinuses with resultant aspiration of thin liquids, prevented with use of small single cup sips. Otherwise, oral phase efficient for mastication of regular texture solids. Trace vallecular and pyriform sinus coating noted post swallow clears with spontaenous dry swallows. Recommend po diet wtih use of aspiration precautions/compensatory strategies and f/u from SLP services for diet tolerance. Suspect that during po intake, mixed consistency boluses and/or rapid rate of intake will increase degree of swallow delay and  increase risk of aspiration. Factors that may increase risk of adverse event in presence of aspiration Rubye Oaks & Clearance Coots 2021): No data recorded Recommendations/Plan: Swallowing Evaluation Recommendations Swallowing Evaluation Recommendations Recommendations: PO diet PO Diet Recommendation: Thin liquids (Level 0); Regular Liquid Administration via: Cup; No straw Medication Administration: Crushed with puree Supervision: Staff to assist with self-feeding; Full assist for feeding Swallowing strategies  : Slow rate; Small bites/sips Postural changes: Position pt fully upright for meals Oral care recommendations: Oral care BID (2x/day) Treatment Plan Treatment Plan Treatment recommendations: Therapy as outlined in treatment plan below Functional status assessment: Patient has had a recent decline in their functional status and demonstrates the ability to make significant improvements  Physician Discharge Summary  [name] ZOX:[id number] DOB: 13-Apr-1955 DOA: 12/27/2022  PCP: Oneita Hurt, No  Admit date: 12/27/2022 Discharge date: 01/02/2023 Admitted From: LTC/SNF Disposition: LTC/SNF Recommendations for Outpatient Follow-up:  Recommend ambulatory referral to urology for urinary retention, chronic right hydronephrosis and BPH Renal ultrasound in 2 to 3 weeks CMP and CBC in 1 week Please follow up on the following pending results: None   Discharge Condition: Stable CODE STATUS: Full code   Hospital course 67 year old M with PMH of dementia, IDDM-2, HTN, HLD and mood disorder brought to ED from SNF due to confusion, lethargy, nausea, dysuria for about 4 days and admitted for severe sepsis due to Staph aureus UTI.  He also had diarrhea.  History is limited due to patient's underlying cognitive deficit.   In ED, tachycardic to 120s and tachypneic to 27.  WBC 19.3 with left shift.  Hgb 18.5. Cr 1.5.  Slightly elevated liver enzymes.  COVID-19, influenza and RSV PCR nonreactive.  Lactic acid 2.3.  CXR without acute finding.  CT abdomen and pelvis showed severe chronic right hydronephrosis, bladder wall thickening, cholelithiasis, hepatic steatosis, CAD, questionable groundglass opacity in LLL and aortic atherosclerosis.  Urinalysis was pending.  INR was elevated to 9.4.  Lactic acid was 2.3 and up to 15 on repeat which was felt to be lab error.  Cultures collected.  Patient was started on broad-spectrum antibiotics with vancomycin and cefepime and admitted severe sepsis likely due to UTI, dehydration, elevated liver enzymes and delirium.   Patient was continued on broad-spectrum antibiotics.  Blood cultures NGTD.  GIP and C. difficile negative.  Urine culture with MRSA.  Antibiotic de-escalated to Zyvox.  ID recommended echocardiogram which did not show significant finding of vegetation.  Cleared for discharge by ID.  He is discharged on Bactrim DS for 3 more days to complete  treatment course.   Hospital course complicated by acute urinary retention requiring Foley catheter.  He has chronic right hydronephrosis and BPH on CT.  He was  started on bethanechol and Flomax.  Failed voiding trial on 01/01/2023.  Discharged with Foley catheter.  Recommend renal ultrasound in 2 to 3 weeks and ambulatory referral to urology.  See individual problem list below for more.   Problems addressed during this hospitalization Severe sepsis due due to Staph aureus UTI-urinalysis with pyuria, he also reported dysuria for the past several days.  CT with severe chronic right hydronephrosis with reimplantation of right ureter and bladder wall thickening and possible bladder outlet obstruction with enlarged prostate.  Urine cultures with MRSA.  Previous attending discussed case with ID, Dr. Algis Liming who recommended TTE which is negative for vegetation.  Antibiotics de-escalated to linezolid, and discharged on p.o. Bactrim for 3 more days to complete a total of 8 days course.   Acute metabolic encephalopathy-resolved.  Awake and alert.  Oriented to self and place but not time.  A little confused about situation.  Patient has underlying dementia. -Reorientation and delirium precaution -Minimize or avoid sedating medications -Discontinued opiate.  Scheduled Tylenol for pain control.   IDDM-2 with hyperglycemia: A1c 7.0%. -Continue home regimen.  Acute urinary retention/severe chronic hydronephrosis with reimplantation of right ureter-likely in the setting of UTI, continue Foley catheter.  Seems like he has chronic urinary retention.  He has chronic right hydronephrosis.  Started on bethanechol and Flomax.  Failed voiding trial on 10/16.  Discharged with Foley catheter. -Renal ultrasound in 2 to 3 weeks -Recommend ambulatory referral to urology   Dysphagia, concern  topically as needed (skin irritation to bilateral inner thighs and groin area).   divalproex 250 MG 24 hr tablet Commonly known as: DEPAKOTE ER Take 250 mg by mouth 2 (two) times daily.   Farxiga 10 MG Tabs tablet Generic drug: dapagliflozin propanediol Take 1 tablet (10 mg total) by mouth daily.   finasteride 5 MG tablet Commonly known as: PROSCAR Take 1 tablet (5 mg total) by mouth daily.   insulin lispro 100 UNIT/ML KwikPen Commonly known as: HUMALOG Inject 4 Units into the skin 3 (three) times daily before meals.   Lantus SoloStar 100 UNIT/ML Solostar Pen Generic drug: insulin glargine Inject 20 Units into the skin daily.   LORazepam 0.5 MG tablet Commonly known as: ATIVAN Take 1 tablet (0.5 mg total) by mouth every 8 (eight) hours as needed for anxiety. What changed:  how much to take when to take this   memantine 10 MG tablet Commonly known as: NAMENDA Take 10 mg  by mouth at bedtime.   metoprolol succinate 25 MG 24 hr tablet Commonly known as: TOPROL-XL Take 1 tablet (25 mg total) by mouth daily.   nystatin powder Commonly known as: MYCOSTATIN/NYSTOP Apply 1 Application topically 3 (three) times daily. To groin   OLANZapine 2.5 MG tablet Commonly known as: ZYPREXA Take 2.5 mg by mouth 2 (two) times daily.   phosphorus 155-852-130 MG tablet Commonly known as: K PHOS NEUTRAL Take 2 tablets (500 mg total) by mouth 4 (four) times daily for 5 days.   sertraline 50 MG tablet Commonly known as: ZOLOFT Take 1 tablet (50 mg total) by mouth daily. What changed: how much to take   sulfamethoxazole-trimethoprim 800-160 MG tablet Commonly known as: Bactrim DS Take 1 tablet by mouth 2 (two) times daily for 3 days.   tamsulosin 0.4 MG Caps capsule Commonly known as: FLOMAX Take 1 capsule (0.4 mg total) by mouth daily after supper.        Consultations: Infectious disease  Procedures/Studies:   ECHOCARDIOGRAM COMPLETE  Result Date: 01/01/2023    ECHOCARDIOGRAM REPORT   Patient Name:   [name] Date of Exam: 01/01/2023 Medical Rec #:  [id number]    Height:       66.0 in Accession #:    [id number]   Weight:       200.0 lb Date of Birth:  [birth year]    BSA:          2.000 m Patient Age:    66 years     BP:           140/83 mmHg Patient Gender: M            HR:           59 bpm. Exam Location:  Inpatient Procedure: 2D Echo, Color Doppler and Cardiac Doppler Indications:    Bacteremia  History:        Patient has prior history of Echocardiogram examinations, most                 recent 11/08/2021. Signs/Symptoms:Bacteremia; Risk                 Factors:Hypertension, Diabetes and Dyslipidemia.  Sonographer:    Milbert Coulter Referring Phys: 43 Daylene Katayama [facility]  Sonographer Comments: No subcostal window. Image acquisition challenging due to uncooperative patient. IMPRESSIONS  1. Left ventricular ejection fraction, by estimation, is 60 to 65%. The left  ventricle has normal function. The left ventricle has no regional wall motion abnormalities. Left ventricular  Physician Discharge Summary  [name] ZOX:[id number] DOB: 13-Apr-1955 DOA: 12/27/2022  PCP: Oneita Hurt, No  Admit date: 12/27/2022 Discharge date: 01/02/2023 Admitted From: LTC/SNF Disposition: LTC/SNF Recommendations for Outpatient Follow-up:  Recommend ambulatory referral to urology for urinary retention, chronic right hydronephrosis and BPH Renal ultrasound in 2 to 3 weeks CMP and CBC in 1 week Please follow up on the following pending results: None   Discharge Condition: Stable CODE STATUS: Full code   Hospital course 67 year old M with PMH of dementia, IDDM-2, HTN, HLD and mood disorder brought to ED from SNF due to confusion, lethargy, nausea, dysuria for about 4 days and admitted for severe sepsis due to Staph aureus UTI.  He also had diarrhea.  History is limited due to patient's underlying cognitive deficit.   In ED, tachycardic to 120s and tachypneic to 27.  WBC 19.3 with left shift.  Hgb 18.5. Cr 1.5.  Slightly elevated liver enzymes.  COVID-19, influenza and RSV PCR nonreactive.  Lactic acid 2.3.  CXR without acute finding.  CT abdomen and pelvis showed severe chronic right hydronephrosis, bladder wall thickening, cholelithiasis, hepatic steatosis, CAD, questionable groundglass opacity in LLL and aortic atherosclerosis.  Urinalysis was pending.  INR was elevated to 9.4.  Lactic acid was 2.3 and up to 15 on repeat which was felt to be lab error.  Cultures collected.  Patient was started on broad-spectrum antibiotics with vancomycin and cefepime and admitted severe sepsis likely due to UTI, dehydration, elevated liver enzymes and delirium.   Patient was continued on broad-spectrum antibiotics.  Blood cultures NGTD.  GIP and C. difficile negative.  Urine culture with MRSA.  Antibiotic de-escalated to Zyvox.  ID recommended echocardiogram which did not show significant finding of vegetation.  Cleared for discharge by ID.  He is discharged on Bactrim DS for 3 more days to complete  treatment course.   Hospital course complicated by acute urinary retention requiring Foley catheter.  He has chronic right hydronephrosis and BPH on CT.  He was  started on bethanechol and Flomax.  Failed voiding trial on 01/01/2023.  Discharged with Foley catheter.  Recommend renal ultrasound in 2 to 3 weeks and ambulatory referral to urology.  See individual problem list below for more.   Problems addressed during this hospitalization Severe sepsis due due to Staph aureus UTI-urinalysis with pyuria, he also reported dysuria for the past several days.  CT with severe chronic right hydronephrosis with reimplantation of right ureter and bladder wall thickening and possible bladder outlet obstruction with enlarged prostate.  Urine cultures with MRSA.  Previous attending discussed case with ID, Dr. Algis Liming who recommended TTE which is negative for vegetation.  Antibiotics de-escalated to linezolid, and discharged on p.o. Bactrim for 3 more days to complete a total of 8 days course.   Acute metabolic encephalopathy-resolved.  Awake and alert.  Oriented to self and place but not time.  A little confused about situation.  Patient has underlying dementia. -Reorientation and delirium precaution -Minimize or avoid sedating medications -Discontinued opiate.  Scheduled Tylenol for pain control.   IDDM-2 with hyperglycemia: A1c 7.0%. -Continue home regimen.  Acute urinary retention/severe chronic hydronephrosis with reimplantation of right ureter-likely in the setting of UTI, continue Foley catheter.  Seems like he has chronic urinary retention.  He has chronic right hydronephrosis.  Started on bethanechol and Flomax.  Failed voiding trial on 10/16.  Discharged with Foley catheter. -Renal ultrasound in 2 to 3 weeks -Recommend ambulatory referral to urology   Dysphagia, concern  topically as needed (skin irritation to bilateral inner thighs and groin area).   divalproex 250 MG 24 hr tablet Commonly known as: DEPAKOTE ER Take 250 mg by mouth 2 (two) times daily.   Farxiga 10 MG Tabs tablet Generic drug: dapagliflozin propanediol Take 1 tablet (10 mg total) by mouth daily.   finasteride 5 MG tablet Commonly known as: PROSCAR Take 1 tablet (5 mg total) by mouth daily.   insulin lispro 100 UNIT/ML KwikPen Commonly known as: HUMALOG Inject 4 Units into the skin 3 (three) times daily before meals.   Lantus SoloStar 100 UNIT/ML Solostar Pen Generic drug: insulin glargine Inject 20 Units into the skin daily.   LORazepam 0.5 MG tablet Commonly known as: ATIVAN Take 1 tablet (0.5 mg total) by mouth every 8 (eight) hours as needed for anxiety. What changed:  how much to take when to take this   memantine 10 MG tablet Commonly known as: NAMENDA Take 10 mg  by mouth at bedtime.   metoprolol succinate 25 MG 24 hr tablet Commonly known as: TOPROL-XL Take 1 tablet (25 mg total) by mouth daily.   nystatin powder Commonly known as: MYCOSTATIN/NYSTOP Apply 1 Application topically 3 (three) times daily. To groin   OLANZapine 2.5 MG tablet Commonly known as: ZYPREXA Take 2.5 mg by mouth 2 (two) times daily.   phosphorus 155-852-130 MG tablet Commonly known as: K PHOS NEUTRAL Take 2 tablets (500 mg total) by mouth 4 (four) times daily for 5 days.   sertraline 50 MG tablet Commonly known as: ZOLOFT Take 1 tablet (50 mg total) by mouth daily. What changed: how much to take   sulfamethoxazole-trimethoprim 800-160 MG tablet Commonly known as: Bactrim DS Take 1 tablet by mouth 2 (two) times daily for 3 days.   tamsulosin 0.4 MG Caps capsule Commonly known as: FLOMAX Take 1 capsule (0.4 mg total) by mouth daily after supper.        Consultations: Infectious disease  Procedures/Studies:   ECHOCARDIOGRAM COMPLETE  Result Date: 01/01/2023    ECHOCARDIOGRAM REPORT   Patient Name:   [name] Date of Exam: 01/01/2023 Medical Rec #:  [id number]    Height:       66.0 in Accession #:    [id number]   Weight:       200.0 lb Date of Birth:  [birth year]    BSA:          2.000 m Patient Age:    66 years     BP:           140/83 mmHg Patient Gender: M            HR:           59 bpm. Exam Location:  Inpatient Procedure: 2D Echo, Color Doppler and Cardiac Doppler Indications:    Bacteremia  History:        Patient has prior history of Echocardiogram examinations, most                 recent 11/08/2021. Signs/Symptoms:Bacteremia; Risk                 Factors:Hypertension, Diabetes and Dyslipidemia.  Sonographer:    Milbert Coulter Referring Phys: 43 Daylene Katayama [facility]  Sonographer Comments: No subcostal window. Image acquisition challenging due to uncooperative patient. IMPRESSIONS  1. Left ventricular ejection fraction, by estimation, is 60 to 65%. The left  ventricle has normal function. The left ventricle has no regional wall motion abnormalities. Left ventricular  Physician Discharge Summary  [name] ZOX:[id number] DOB: 13-Apr-1955 DOA: 12/27/2022  PCP: Oneita Hurt, No  Admit date: 12/27/2022 Discharge date: 01/02/2023 Admitted From: LTC/SNF Disposition: LTC/SNF Recommendations for Outpatient Follow-up:  Recommend ambulatory referral to urology for urinary retention, chronic right hydronephrosis and BPH Renal ultrasound in 2 to 3 weeks CMP and CBC in 1 week Please follow up on the following pending results: None   Discharge Condition: Stable CODE STATUS: Full code   Hospital course 67 year old M with PMH of dementia, IDDM-2, HTN, HLD and mood disorder brought to ED from SNF due to confusion, lethargy, nausea, dysuria for about 4 days and admitted for severe sepsis due to Staph aureus UTI.  He also had diarrhea.  History is limited due to patient's underlying cognitive deficit.   In ED, tachycardic to 120s and tachypneic to 27.  WBC 19.3 with left shift.  Hgb 18.5. Cr 1.5.  Slightly elevated liver enzymes.  COVID-19, influenza and RSV PCR nonreactive.  Lactic acid 2.3.  CXR without acute finding.  CT abdomen and pelvis showed severe chronic right hydronephrosis, bladder wall thickening, cholelithiasis, hepatic steatosis, CAD, questionable groundglass opacity in LLL and aortic atherosclerosis.  Urinalysis was pending.  INR was elevated to 9.4.  Lactic acid was 2.3 and up to 15 on repeat which was felt to be lab error.  Cultures collected.  Patient was started on broad-spectrum antibiotics with vancomycin and cefepime and admitted severe sepsis likely due to UTI, dehydration, elevated liver enzymes and delirium.   Patient was continued on broad-spectrum antibiotics.  Blood cultures NGTD.  GIP and C. difficile negative.  Urine culture with MRSA.  Antibiotic de-escalated to Zyvox.  ID recommended echocardiogram which did not show significant finding of vegetation.  Cleared for discharge by ID.  He is discharged on Bactrim DS for 3 more days to complete  treatment course.   Hospital course complicated by acute urinary retention requiring Foley catheter.  He has chronic right hydronephrosis and BPH on CT.  He was  started on bethanechol and Flomax.  Failed voiding trial on 01/01/2023.  Discharged with Foley catheter.  Recommend renal ultrasound in 2 to 3 weeks and ambulatory referral to urology.  See individual problem list below for more.   Problems addressed during this hospitalization Severe sepsis due due to Staph aureus UTI-urinalysis with pyuria, he also reported dysuria for the past several days.  CT with severe chronic right hydronephrosis with reimplantation of right ureter and bladder wall thickening and possible bladder outlet obstruction with enlarged prostate.  Urine cultures with MRSA.  Previous attending discussed case with ID, Dr. Algis Liming who recommended TTE which is negative for vegetation.  Antibiotics de-escalated to linezolid, and discharged on p.o. Bactrim for 3 more days to complete a total of 8 days course.   Acute metabolic encephalopathy-resolved.  Awake and alert.  Oriented to self and place but not time.  A little confused about situation.  Patient has underlying dementia. -Reorientation and delirium precaution -Minimize or avoid sedating medications -Discontinued opiate.  Scheduled Tylenol for pain control.   IDDM-2 with hyperglycemia: A1c 7.0%. -Continue home regimen.  Acute urinary retention/severe chronic hydronephrosis with reimplantation of right ureter-likely in the setting of UTI, continue Foley catheter.  Seems like he has chronic urinary retention.  He has chronic right hydronephrosis.  Started on bethanechol and Flomax.  Failed voiding trial on 10/16.  Discharged with Foley catheter. -Renal ultrasound in 2 to 3 weeks -Recommend ambulatory referral to urology   Dysphagia, concern  2.55 cm AV Vmax:           159.00 cm/s AV Vmean:          106.000 cm/s AV VTI:            0.329 m AV Peak Grad:      10.1 mmHg AV Mean Grad:      5.0 mmHg LVOT Vmax:         127.00 cm/s LVOT Vmean:        86.500 cm/s LVOT VTI:          0.267 m LVOT/AV VTI ratio: 0.81  AORTA Ao Root diam: 3.40 cm MITRAL VALVE MV Area (PHT): 3.27 cm    SHUNTS MV Decel Time: 232 msec    Systemic VTI:  0.27 m MV E velocity: 68.10 cm/s  Systemic Diam: 2.00 cm MV A velocity: 92.20 cm/s MV E/A ratio:  0.74 Lennie Odor MD Electronically signed by Lennie Odor MD Signature Date/Time: 01/01/2023/8:54:38 PM    Final    DG CHEST PORT 1 VIEW  Result Date:  12/31/2022 CLINICAL DATA:  Cough EXAM: PORTABLE CHEST 1 VIEW COMPARISON:  12/27/2022 FINDINGS: Low lung volumes. Bandlike atelectasis in the left perihilar region and left base. Stable cardiomediastinal silhouette. No pneumothorax. IMPRESSION: Low lung volumes with bandlike atelectasis in the left perihilar region and left base. Electronically Signed   By: Jasmine Pang M.D.   On: 12/31/2022 15:55   DG Swallowing Func-Speech Pathology  Result Date: 12/31/2022 Table formatting from the original result was not included. Modified Barium Swallow Study Patient Details Name: TAJEE SAVANT MRN: [id number] Date of Birth: Aug 13, 1955 Today's Date: 12/31/2022 HPI/PMH: HPI: Pt is a 67 yo. male with PMH including HTN, HLD, IDDM. Presenting to ED 10/11 with three day history of AMS, abdominal discomfort, and nausea.  He also reports dysuria for the past 4 days. Head CT negative. CXR no active disease. Seen by SLP 12/28/22 with no overt s/s of dysphagia or aspiration but with recommendations to resume full liquid diet given frequent emesis and ability to upgrade per MD discretion. SLP received new orders to evaluate swallowing due to RN reports of frequent coughing with med adminsitration and during meals with desaturations. Clinical Impression: Clinical Impression: Patient presents with a mild oropharyngeal dysphagia characterized primarily by intermittently delayed swallow initiation to the level of the pyriform sinuses with resultant aspiration of thin liquids, prevented with use of small single cup sips. Otherwise, oral phase efficient for mastication of regular texture solids. Trace vallecular and pyriform sinus coating noted post swallow clears with spontaenous dry swallows. Recommend po diet wtih use of aspiration precautions/compensatory strategies and f/u from SLP services for diet tolerance. Suspect that during po intake, mixed consistency boluses and/or rapid rate of intake will increase degree of swallow delay and  increase risk of aspiration. Factors that may increase risk of adverse event in presence of aspiration Rubye Oaks & Clearance Coots 2021): No data recorded Recommendations/Plan: Swallowing Evaluation Recommendations Swallowing Evaluation Recommendations Recommendations: PO diet PO Diet Recommendation: Thin liquids (Level 0); Regular Liquid Administration via: Cup; No straw Medication Administration: Crushed with puree Supervision: Staff to assist with self-feeding; Full assist for feeding Swallowing strategies  : Slow rate; Small bites/sips Postural changes: Position pt fully upright for meals Oral care recommendations: Oral care BID (2x/day) Treatment Plan Treatment Plan Treatment recommendations: Therapy as outlined in treatment plan below Functional status assessment: Patient has had a recent decline in their functional status and demonstrates the ability to make significant improvements  2.55 cm AV Vmax:           159.00 cm/s AV Vmean:          106.000 cm/s AV VTI:            0.329 m AV Peak Grad:      10.1 mmHg AV Mean Grad:      5.0 mmHg LVOT Vmax:         127.00 cm/s LVOT Vmean:        86.500 cm/s LVOT VTI:          0.267 m LVOT/AV VTI ratio: 0.81  AORTA Ao Root diam: 3.40 cm MITRAL VALVE MV Area (PHT): 3.27 cm    SHUNTS MV Decel Time: 232 msec    Systemic VTI:  0.27 m MV E velocity: 68.10 cm/s  Systemic Diam: 2.00 cm MV A velocity: 92.20 cm/s MV E/A ratio:  0.74 Lennie Odor MD Electronically signed by Lennie Odor MD Signature Date/Time: 01/01/2023/8:54:38 PM    Final    DG CHEST PORT 1 VIEW  Result Date:  12/31/2022 CLINICAL DATA:  Cough EXAM: PORTABLE CHEST 1 VIEW COMPARISON:  12/27/2022 FINDINGS: Low lung volumes. Bandlike atelectasis in the left perihilar region and left base. Stable cardiomediastinal silhouette. No pneumothorax. IMPRESSION: Low lung volumes with bandlike atelectasis in the left perihilar region and left base. Electronically Signed   By: Jasmine Pang M.D.   On: 12/31/2022 15:55   DG Swallowing Func-Speech Pathology  Result Date: 12/31/2022 Table formatting from the original result was not included. Modified Barium Swallow Study Patient Details Name: TAJEE SAVANT MRN: [id number] Date of Birth: Aug 13, 1955 Today's Date: 12/31/2022 HPI/PMH: HPI: Pt is a 67 yo. male with PMH including HTN, HLD, IDDM. Presenting to ED 10/11 with three day history of AMS, abdominal discomfort, and nausea.  He also reports dysuria for the past 4 days. Head CT negative. CXR no active disease. Seen by SLP 12/28/22 with no overt s/s of dysphagia or aspiration but with recommendations to resume full liquid diet given frequent emesis and ability to upgrade per MD discretion. SLP received new orders to evaluate swallowing due to RN reports of frequent coughing with med adminsitration and during meals with desaturations. Clinical Impression: Clinical Impression: Patient presents with a mild oropharyngeal dysphagia characterized primarily by intermittently delayed swallow initiation to the level of the pyriform sinuses with resultant aspiration of thin liquids, prevented with use of small single cup sips. Otherwise, oral phase efficient for mastication of regular texture solids. Trace vallecular and pyriform sinus coating noted post swallow clears with spontaenous dry swallows. Recommend po diet wtih use of aspiration precautions/compensatory strategies and f/u from SLP services for diet tolerance. Suspect that during po intake, mixed consistency boluses and/or rapid rate of intake will increase degree of swallow delay and  increase risk of aspiration. Factors that may increase risk of adverse event in presence of aspiration Rubye Oaks & Clearance Coots 2021): No data recorded Recommendations/Plan: Swallowing Evaluation Recommendations Swallowing Evaluation Recommendations Recommendations: PO diet PO Diet Recommendation: Thin liquids (Level 0); Regular Liquid Administration via: Cup; No straw Medication Administration: Crushed with puree Supervision: Staff to assist with self-feeding; Full assist for feeding Swallowing strategies  : Slow rate; Small bites/sips Postural changes: Position pt fully upright for meals Oral care recommendations: Oral care BID (2x/day) Treatment Plan Treatment Plan Treatment recommendations: Therapy as outlined in treatment plan below Functional status assessment: Patient has had a recent decline in their functional status and demonstrates the ability to make significant improvements  Physician Discharge Summary  [name] ZOX:[id number] DOB: 13-Apr-1955 DOA: 12/27/2022  PCP: Oneita Hurt, No  Admit date: 12/27/2022 Discharge date: 01/02/2023 Admitted From: LTC/SNF Disposition: LTC/SNF Recommendations for Outpatient Follow-up:  Recommend ambulatory referral to urology for urinary retention, chronic right hydronephrosis and BPH Renal ultrasound in 2 to 3 weeks CMP and CBC in 1 week Please follow up on the following pending results: None   Discharge Condition: Stable CODE STATUS: Full code   Hospital course 67 year old M with PMH of dementia, IDDM-2, HTN, HLD and mood disorder brought to ED from SNF due to confusion, lethargy, nausea, dysuria for about 4 days and admitted for severe sepsis due to Staph aureus UTI.  He also had diarrhea.  History is limited due to patient's underlying cognitive deficit.   In ED, tachycardic to 120s and tachypneic to 27.  WBC 19.3 with left shift.  Hgb 18.5. Cr 1.5.  Slightly elevated liver enzymes.  COVID-19, influenza and RSV PCR nonreactive.  Lactic acid 2.3.  CXR without acute finding.  CT abdomen and pelvis showed severe chronic right hydronephrosis, bladder wall thickening, cholelithiasis, hepatic steatosis, CAD, questionable groundglass opacity in LLL and aortic atherosclerosis.  Urinalysis was pending.  INR was elevated to 9.4.  Lactic acid was 2.3 and up to 15 on repeat which was felt to be lab error.  Cultures collected.  Patient was started on broad-spectrum antibiotics with vancomycin and cefepime and admitted severe sepsis likely due to UTI, dehydration, elevated liver enzymes and delirium.   Patient was continued on broad-spectrum antibiotics.  Blood cultures NGTD.  GIP and C. difficile negative.  Urine culture with MRSA.  Antibiotic de-escalated to Zyvox.  ID recommended echocardiogram which did not show significant finding of vegetation.  Cleared for discharge by ID.  He is discharged on Bactrim DS for 3 more days to complete  treatment course.   Hospital course complicated by acute urinary retention requiring Foley catheter.  He has chronic right hydronephrosis and BPH on CT.  He was  started on bethanechol and Flomax.  Failed voiding trial on 01/01/2023.  Discharged with Foley catheter.  Recommend renal ultrasound in 2 to 3 weeks and ambulatory referral to urology.  See individual problem list below for more.   Problems addressed during this hospitalization Severe sepsis due due to Staph aureus UTI-urinalysis with pyuria, he also reported dysuria for the past several days.  CT with severe chronic right hydronephrosis with reimplantation of right ureter and bladder wall thickening and possible bladder outlet obstruction with enlarged prostate.  Urine cultures with MRSA.  Previous attending discussed case with ID, Dr. Algis Liming who recommended TTE which is negative for vegetation.  Antibiotics de-escalated to linezolid, and discharged on p.o. Bactrim for 3 more days to complete a total of 8 days course.   Acute metabolic encephalopathy-resolved.  Awake and alert.  Oriented to self and place but not time.  A little confused about situation.  Patient has underlying dementia. -Reorientation and delirium precaution -Minimize or avoid sedating medications -Discontinued opiate.  Scheduled Tylenol for pain control.   IDDM-2 with hyperglycemia: A1c 7.0%. -Continue home regimen.  Acute urinary retention/severe chronic hydronephrosis with reimplantation of right ureter-likely in the setting of UTI, continue Foley catheter.  Seems like he has chronic urinary retention.  He has chronic right hydronephrosis.  Started on bethanechol and Flomax.  Failed voiding trial on 10/16.  Discharged with Foley catheter. -Renal ultrasound in 2 to 3 weeks -Recommend ambulatory referral to urology   Dysphagia, concern  topically as needed (skin irritation to bilateral inner thighs and groin area).   divalproex 250 MG 24 hr tablet Commonly known as: DEPAKOTE ER Take 250 mg by mouth 2 (two) times daily.   Farxiga 10 MG Tabs tablet Generic drug: dapagliflozin propanediol Take 1 tablet (10 mg total) by mouth daily.   finasteride 5 MG tablet Commonly known as: PROSCAR Take 1 tablet (5 mg total) by mouth daily.   insulin lispro 100 UNIT/ML KwikPen Commonly known as: HUMALOG Inject 4 Units into the skin 3 (three) times daily before meals.   Lantus SoloStar 100 UNIT/ML Solostar Pen Generic drug: insulin glargine Inject 20 Units into the skin daily.   LORazepam 0.5 MG tablet Commonly known as: ATIVAN Take 1 tablet (0.5 mg total) by mouth every 8 (eight) hours as needed for anxiety. What changed:  how much to take when to take this   memantine 10 MG tablet Commonly known as: NAMENDA Take 10 mg  by mouth at bedtime.   metoprolol succinate 25 MG 24 hr tablet Commonly known as: TOPROL-XL Take 1 tablet (25 mg total) by mouth daily.   nystatin powder Commonly known as: MYCOSTATIN/NYSTOP Apply 1 Application topically 3 (three) times daily. To groin   OLANZapine 2.5 MG tablet Commonly known as: ZYPREXA Take 2.5 mg by mouth 2 (two) times daily.   phosphorus 155-852-130 MG tablet Commonly known as: K PHOS NEUTRAL Take 2 tablets (500 mg total) by mouth 4 (four) times daily for 5 days.   sertraline 50 MG tablet Commonly known as: ZOLOFT Take 1 tablet (50 mg total) by mouth daily. What changed: how much to take   sulfamethoxazole-trimethoprim 800-160 MG tablet Commonly known as: Bactrim DS Take 1 tablet by mouth 2 (two) times daily for 3 days.   tamsulosin 0.4 MG Caps capsule Commonly known as: FLOMAX Take 1 capsule (0.4 mg total) by mouth daily after supper.        Consultations: Infectious disease  Procedures/Studies:   ECHOCARDIOGRAM COMPLETE  Result Date: 01/01/2023    ECHOCARDIOGRAM REPORT   Patient Name:   [name] Date of Exam: 01/01/2023 Medical Rec #:  [id number]    Height:       66.0 in Accession #:    [id number]   Weight:       200.0 lb Date of Birth:  [birth year]    BSA:          2.000 m Patient Age:    66 years     BP:           140/83 mmHg Patient Gender: M            HR:           59 bpm. Exam Location:  Inpatient Procedure: 2D Echo, Color Doppler and Cardiac Doppler Indications:    Bacteremia  History:        Patient has prior history of Echocardiogram examinations, most                 recent 11/08/2021. Signs/Symptoms:Bacteremia; Risk                 Factors:Hypertension, Diabetes and Dyslipidemia.  Sonographer:    Milbert Coulter Referring Phys: 43 Daylene Katayama [facility]  Sonographer Comments: No subcostal window. Image acquisition challenging due to uncooperative patient. IMPRESSIONS  1. Left ventricular ejection fraction, by estimation, is 60 to 65%. The left  ventricle has normal function. The left ventricle has no regional wall motion abnormalities. Left ventricular

## 2023-01-02 NOTE — Plan of Care (Signed)
  Problem: Fluid Volume: Goal: Hemodynamic stability will improve Outcome: Progressing   Problem: Clinical Measurements: Goal: Diagnostic test results will improve Outcome: Progressing Goal: Signs and symptoms of infection will decrease Outcome: Progressing   Problem: Respiratory: Goal: Ability to maintain adequate ventilation will improve Outcome: Progressing   Problem: Education: Goal: Ability to describe self-care measures that may prevent or decrease complications (Diabetes Survival Skills Education) will improve Outcome: Progressing Goal: Individualized Educational Video(s) Outcome: Progressing   Problem: Coping: Goal: Ability to adjust to condition or change in health will improve Outcome: Progressing   Problem: Fluid Volume: Goal: Ability to maintain a balanced intake and output will improve Outcome: Progressing   Problem: Health Behavior/Discharge Planning: Goal: Ability to identify and utilize available resources and services will improve Outcome: Progressing Goal: Ability to manage health-related needs will improve Outcome: Progressing   Problem: Metabolic: Goal: Ability to maintain appropriate glucose levels will improve Outcome: Progressing   Problem: Nutritional: Goal: Maintenance of adequate nutrition will improve Outcome: Progressing Goal: Progress toward achieving an optimal weight will improve Outcome: Progressing   Problem: Skin Integrity: Goal: Risk for impaired skin integrity will decrease Outcome: Progressing   Problem: Tissue Perfusion: Goal: Adequacy of tissue perfusion will improve Outcome: Progressing   Problem: Education: Goal: Knowledge of General Education information will improve Description: Including pain rating scale, medication(s)/side effects and non-pharmacologic comfort measures Outcome: Progressing   Problem: Health Behavior/Discharge Planning: Goal: Ability to manage health-related needs will improve Outcome:  Progressing   Problem: Clinical Measurements: Goal: Ability to maintain clinical measurements within normal limits will improve Outcome: Progressing Goal: Will remain free from infection Outcome: Progressing Goal: Diagnostic test results will improve Outcome: Progressing Goal: Respiratory complications will improve Outcome: Progressing Goal: Cardiovascular complication will be avoided Outcome: Progressing   Problem: Activity: Goal: Risk for activity intolerance will decrease Outcome: Progressing   Problem: Coping: Goal: Level of anxiety will decrease Outcome: Progressing   Problem: Elimination: Goal: Will not experience complications related to bowel motility Outcome: Progressing Goal: Will not experience complications related to urinary retention Outcome: Progressing   Problem: Pain Managment: Goal: General experience of comfort will improve Outcome: Progressing

## 2023-01-03 DIAGNOSIS — E1121 Type 2 diabetes mellitus with diabetic nephropathy: Secondary | ICD-10-CM | POA: Diagnosis not present

## 2023-01-03 DIAGNOSIS — R1311 Dysphagia, oral phase: Secondary | ICD-10-CM | POA: Diagnosis not present

## 2023-01-03 DIAGNOSIS — M6281 Muscle weakness (generalized): Secondary | ICD-10-CM | POA: Diagnosis not present

## 2023-01-04 DIAGNOSIS — I2510 Atherosclerotic heart disease of native coronary artery without angina pectoris: Secondary | ICD-10-CM | POA: Diagnosis not present

## 2023-01-04 DIAGNOSIS — E119 Type 2 diabetes mellitus without complications: Secondary | ICD-10-CM | POA: Diagnosis not present

## 2023-01-04 DIAGNOSIS — I10 Essential (primary) hypertension: Secondary | ICD-10-CM | POA: Diagnosis not present

## 2023-01-04 DIAGNOSIS — N189 Chronic kidney disease, unspecified: Secondary | ICD-10-CM | POA: Diagnosis not present

## 2023-01-05 DIAGNOSIS — R1311 Dysphagia, oral phase: Secondary | ICD-10-CM | POA: Diagnosis not present

## 2023-01-05 DIAGNOSIS — E1121 Type 2 diabetes mellitus with diabetic nephropathy: Secondary | ICD-10-CM | POA: Diagnosis not present

## 2023-01-05 DIAGNOSIS — M6281 Muscle weakness (generalized): Secondary | ICD-10-CM | POA: Diagnosis not present

## 2023-01-05 NOTE — Plan of Care (Signed)
[facility] Tonsillectomy/Adenoidectomy, Postoperative PEDS care plan entered in error.

## 2023-01-06 DIAGNOSIS — E1121 Type 2 diabetes mellitus with diabetic nephropathy: Secondary | ICD-10-CM | POA: Diagnosis not present

## 2023-01-06 DIAGNOSIS — R1311 Dysphagia, oral phase: Secondary | ICD-10-CM | POA: Diagnosis not present

## 2023-01-06 DIAGNOSIS — M6281 Muscle weakness (generalized): Secondary | ICD-10-CM | POA: Diagnosis not present

## 2023-01-07 DIAGNOSIS — E1121 Type 2 diabetes mellitus with diabetic nephropathy: Secondary | ICD-10-CM | POA: Diagnosis not present

## 2023-01-07 DIAGNOSIS — M6281 Muscle weakness (generalized): Secondary | ICD-10-CM | POA: Diagnosis not present

## 2023-01-07 DIAGNOSIS — R1311 Dysphagia, oral phase: Secondary | ICD-10-CM | POA: Diagnosis not present

## 2023-01-08 DIAGNOSIS — E1121 Type 2 diabetes mellitus with diabetic nephropathy: Secondary | ICD-10-CM | POA: Diagnosis not present

## 2023-01-08 DIAGNOSIS — M6281 Muscle weakness (generalized): Secondary | ICD-10-CM | POA: Diagnosis not present

## 2023-01-08 DIAGNOSIS — R1311 Dysphagia, oral phase: Secondary | ICD-10-CM | POA: Diagnosis not present

## 2023-01-09 DIAGNOSIS — N390 Urinary tract infection, site not specified: Secondary | ICD-10-CM | POA: Diagnosis not present

## 2023-01-09 DIAGNOSIS — R339 Retention of urine, unspecified: Secondary | ICD-10-CM | POA: Diagnosis not present

## 2023-01-09 DIAGNOSIS — M6281 Muscle weakness (generalized): Secondary | ICD-10-CM | POA: Diagnosis not present

## 2023-01-09 DIAGNOSIS — R5381 Other malaise: Secondary | ICD-10-CM | POA: Diagnosis not present

## 2023-01-09 DIAGNOSIS — R1311 Dysphagia, oral phase: Secondary | ICD-10-CM | POA: Diagnosis not present

## 2023-01-09 DIAGNOSIS — E1121 Type 2 diabetes mellitus with diabetic nephropathy: Secondary | ICD-10-CM | POA: Diagnosis not present

## 2023-01-09 DIAGNOSIS — A419 Sepsis, unspecified organism: Secondary | ICD-10-CM | POA: Diagnosis not present

## 2023-01-09 DIAGNOSIS — R296 Repeated falls: Secondary | ICD-10-CM | POA: Diagnosis not present

## 2023-01-10 DIAGNOSIS — R1311 Dysphagia, oral phase: Secondary | ICD-10-CM | POA: Diagnosis not present

## 2023-01-10 DIAGNOSIS — E1121 Type 2 diabetes mellitus with diabetic nephropathy: Secondary | ICD-10-CM | POA: Diagnosis not present

## 2023-01-10 DIAGNOSIS — M6281 Muscle weakness (generalized): Secondary | ICD-10-CM | POA: Diagnosis not present

## 2023-01-11 DIAGNOSIS — I10 Essential (primary) hypertension: Secondary | ICD-10-CM | POA: Diagnosis not present

## 2023-01-11 DIAGNOSIS — M6281 Muscle weakness (generalized): Secondary | ICD-10-CM | POA: Diagnosis not present

## 2023-01-11 DIAGNOSIS — E1121 Type 2 diabetes mellitus with diabetic nephropathy: Secondary | ICD-10-CM | POA: Diagnosis not present

## 2023-01-11 DIAGNOSIS — R1311 Dysphagia, oral phase: Secondary | ICD-10-CM | POA: Diagnosis not present

## 2023-01-11 DIAGNOSIS — D649 Anemia, unspecified: Secondary | ICD-10-CM | POA: Diagnosis not present

## 2023-01-12 DIAGNOSIS — M6281 Muscle weakness (generalized): Secondary | ICD-10-CM | POA: Diagnosis not present

## 2023-01-12 DIAGNOSIS — R1311 Dysphagia, oral phase: Secondary | ICD-10-CM | POA: Diagnosis not present

## 2023-01-12 DIAGNOSIS — E1121 Type 2 diabetes mellitus with diabetic nephropathy: Secondary | ICD-10-CM | POA: Diagnosis not present

## 2023-01-13 DIAGNOSIS — M6281 Muscle weakness (generalized): Secondary | ICD-10-CM | POA: Diagnosis not present

## 2023-01-13 DIAGNOSIS — R1311 Dysphagia, oral phase: Secondary | ICD-10-CM | POA: Diagnosis not present

## 2023-01-13 DIAGNOSIS — E1121 Type 2 diabetes mellitus with diabetic nephropathy: Secondary | ICD-10-CM | POA: Diagnosis not present

## 2023-01-14 DIAGNOSIS — M6281 Muscle weakness (generalized): Secondary | ICD-10-CM | POA: Diagnosis not present

## 2023-01-14 DIAGNOSIS — R1311 Dysphagia, oral phase: Secondary | ICD-10-CM | POA: Diagnosis not present

## 2023-01-14 DIAGNOSIS — E1121 Type 2 diabetes mellitus with diabetic nephropathy: Secondary | ICD-10-CM | POA: Diagnosis not present

## 2023-01-15 DIAGNOSIS — E1140 Type 2 diabetes mellitus with diabetic neuropathy, unspecified: Secondary | ICD-10-CM | POA: Diagnosis not present

## 2023-01-15 DIAGNOSIS — R338 Other retention of urine: Secondary | ICD-10-CM | POA: Diagnosis not present

## 2023-01-15 DIAGNOSIS — E1121 Type 2 diabetes mellitus with diabetic nephropathy: Secondary | ICD-10-CM | POA: Diagnosis not present

## 2023-01-15 DIAGNOSIS — R1311 Dysphagia, oral phase: Secondary | ICD-10-CM | POA: Diagnosis not present

## 2023-01-15 DIAGNOSIS — I129 Hypertensive chronic kidney disease with stage 1 through stage 4 chronic kidney disease, or unspecified chronic kidney disease: Secondary | ICD-10-CM | POA: Diagnosis not present

## 2023-01-15 DIAGNOSIS — M6281 Muscle weakness (generalized): Secondary | ICD-10-CM | POA: Diagnosis not present

## 2023-01-16 DIAGNOSIS — E1121 Type 2 diabetes mellitus with diabetic nephropathy: Secondary | ICD-10-CM | POA: Diagnosis not present

## 2023-01-16 DIAGNOSIS — R1311 Dysphagia, oral phase: Secondary | ICD-10-CM | POA: Diagnosis not present

## 2023-01-16 DIAGNOSIS — M6281 Muscle weakness (generalized): Secondary | ICD-10-CM | POA: Diagnosis not present

## 2023-01-17 DIAGNOSIS — E1121 Type 2 diabetes mellitus with diabetic nephropathy: Secondary | ICD-10-CM | POA: Diagnosis not present

## 2023-01-17 DIAGNOSIS — R1311 Dysphagia, oral phase: Secondary | ICD-10-CM | POA: Diagnosis not present

## 2023-01-17 DIAGNOSIS — M6281 Muscle weakness (generalized): Secondary | ICD-10-CM | POA: Diagnosis not present

## 2023-01-19 DIAGNOSIS — M6281 Muscle weakness (generalized): Secondary | ICD-10-CM | POA: Diagnosis not present

## 2023-01-19 DIAGNOSIS — E1121 Type 2 diabetes mellitus with diabetic nephropathy: Secondary | ICD-10-CM | POA: Diagnosis not present

## 2023-01-19 DIAGNOSIS — R1311 Dysphagia, oral phase: Secondary | ICD-10-CM | POA: Diagnosis not present

## 2023-01-20 DIAGNOSIS — E1121 Type 2 diabetes mellitus with diabetic nephropathy: Secondary | ICD-10-CM | POA: Diagnosis not present

## 2023-01-20 DIAGNOSIS — M6281 Muscle weakness (generalized): Secondary | ICD-10-CM | POA: Diagnosis not present

## 2023-01-20 DIAGNOSIS — R1311 Dysphagia, oral phase: Secondary | ICD-10-CM | POA: Diagnosis not present

## 2023-01-21 DIAGNOSIS — E1121 Type 2 diabetes mellitus with diabetic nephropathy: Secondary | ICD-10-CM | POA: Diagnosis not present

## 2023-01-21 DIAGNOSIS — R1311 Dysphagia, oral phase: Secondary | ICD-10-CM | POA: Diagnosis not present

## 2023-01-21 DIAGNOSIS — M6281 Muscle weakness (generalized): Secondary | ICD-10-CM | POA: Diagnosis not present

## 2023-01-21 DIAGNOSIS — N2889 Other specified disorders of kidney and ureter: Secondary | ICD-10-CM | POA: Diagnosis not present

## 2023-01-23 DIAGNOSIS — R1311 Dysphagia, oral phase: Secondary | ICD-10-CM | POA: Diagnosis not present

## 2023-01-23 DIAGNOSIS — E1121 Type 2 diabetes mellitus with diabetic nephropathy: Secondary | ICD-10-CM | POA: Diagnosis not present

## 2023-01-23 DIAGNOSIS — M6281 Muscle weakness (generalized): Secondary | ICD-10-CM | POA: Diagnosis not present

## 2023-01-24 DIAGNOSIS — E1121 Type 2 diabetes mellitus with diabetic nephropathy: Secondary | ICD-10-CM | POA: Diagnosis not present

## 2023-01-24 DIAGNOSIS — M6281 Muscle weakness (generalized): Secondary | ICD-10-CM | POA: Diagnosis not present

## 2023-01-24 DIAGNOSIS — R1311 Dysphagia, oral phase: Secondary | ICD-10-CM | POA: Diagnosis not present

## 2023-01-25 DIAGNOSIS — E1121 Type 2 diabetes mellitus with diabetic nephropathy: Secondary | ICD-10-CM | POA: Diagnosis not present

## 2023-01-25 DIAGNOSIS — M6281 Muscle weakness (generalized): Secondary | ICD-10-CM | POA: Diagnosis not present

## 2023-01-25 DIAGNOSIS — R1311 Dysphagia, oral phase: Secondary | ICD-10-CM | POA: Diagnosis not present

## 2023-01-27 DIAGNOSIS — R1311 Dysphagia, oral phase: Secondary | ICD-10-CM | POA: Diagnosis not present

## 2023-01-27 DIAGNOSIS — M6281 Muscle weakness (generalized): Secondary | ICD-10-CM | POA: Diagnosis not present

## 2023-01-27 DIAGNOSIS — E1121 Type 2 diabetes mellitus with diabetic nephropathy: Secondary | ICD-10-CM | POA: Diagnosis not present

## 2023-01-28 DIAGNOSIS — M6281 Muscle weakness (generalized): Secondary | ICD-10-CM | POA: Diagnosis not present

## 2023-01-28 DIAGNOSIS — E1121 Type 2 diabetes mellitus with diabetic nephropathy: Secondary | ICD-10-CM | POA: Diagnosis not present

## 2023-01-28 DIAGNOSIS — R1311 Dysphagia, oral phase: Secondary | ICD-10-CM | POA: Diagnosis not present

## 2023-01-29 DIAGNOSIS — R1311 Dysphagia, oral phase: Secondary | ICD-10-CM | POA: Diagnosis not present

## 2023-01-29 DIAGNOSIS — E1121 Type 2 diabetes mellitus with diabetic nephropathy: Secondary | ICD-10-CM | POA: Diagnosis not present

## 2023-01-29 DIAGNOSIS — M6281 Muscle weakness (generalized): Secondary | ICD-10-CM | POA: Diagnosis not present

## 2023-01-30 DIAGNOSIS — E1121 Type 2 diabetes mellitus with diabetic nephropathy: Secondary | ICD-10-CM | POA: Diagnosis not present

## 2023-01-30 DIAGNOSIS — M6281 Muscle weakness (generalized): Secondary | ICD-10-CM | POA: Diagnosis not present

## 2023-01-30 DIAGNOSIS — R1311 Dysphagia, oral phase: Secondary | ICD-10-CM | POA: Diagnosis not present

## 2023-01-31 DIAGNOSIS — E1121 Type 2 diabetes mellitus with diabetic nephropathy: Secondary | ICD-10-CM | POA: Diagnosis not present

## 2023-01-31 DIAGNOSIS — M6281 Muscle weakness (generalized): Secondary | ICD-10-CM | POA: Diagnosis not present

## 2023-01-31 DIAGNOSIS — R1311 Dysphagia, oral phase: Secondary | ICD-10-CM | POA: Diagnosis not present

## 2023-02-01 DIAGNOSIS — D510 Vitamin B12 deficiency anemia due to intrinsic factor deficiency: Secondary | ICD-10-CM | POA: Diagnosis not present

## 2023-02-01 DIAGNOSIS — I10 Essential (primary) hypertension: Secondary | ICD-10-CM | POA: Diagnosis not present

## 2023-02-02 DIAGNOSIS — R1311 Dysphagia, oral phase: Secondary | ICD-10-CM | POA: Diagnosis not present

## 2023-02-02 DIAGNOSIS — M6281 Muscle weakness (generalized): Secondary | ICD-10-CM | POA: Diagnosis not present

## 2023-02-02 DIAGNOSIS — E1121 Type 2 diabetes mellitus with diabetic nephropathy: Secondary | ICD-10-CM | POA: Diagnosis not present

## 2023-02-03 DIAGNOSIS — M6281 Muscle weakness (generalized): Secondary | ICD-10-CM | POA: Diagnosis not present

## 2023-02-03 DIAGNOSIS — E1121 Type 2 diabetes mellitus with diabetic nephropathy: Secondary | ICD-10-CM | POA: Diagnosis not present

## 2023-02-03 DIAGNOSIS — R1311 Dysphagia, oral phase: Secondary | ICD-10-CM | POA: Diagnosis not present

## 2023-02-04 DIAGNOSIS — R1311 Dysphagia, oral phase: Secondary | ICD-10-CM | POA: Diagnosis not present

## 2023-02-04 DIAGNOSIS — M6281 Muscle weakness (generalized): Secondary | ICD-10-CM | POA: Diagnosis not present

## 2023-02-04 DIAGNOSIS — E1121 Type 2 diabetes mellitus with diabetic nephropathy: Secondary | ICD-10-CM | POA: Diagnosis not present

## 2023-02-05 DIAGNOSIS — M6281 Muscle weakness (generalized): Secondary | ICD-10-CM | POA: Diagnosis not present

## 2023-02-05 DIAGNOSIS — R1311 Dysphagia, oral phase: Secondary | ICD-10-CM | POA: Diagnosis not present

## 2023-02-05 DIAGNOSIS — E1121 Type 2 diabetes mellitus with diabetic nephropathy: Secondary | ICD-10-CM | POA: Diagnosis not present

## 2023-02-06 DIAGNOSIS — K219 Gastro-esophageal reflux disease without esophagitis: Secondary | ICD-10-CM | POA: Diagnosis not present

## 2023-02-06 DIAGNOSIS — M6281 Muscle weakness (generalized): Secondary | ICD-10-CM | POA: Diagnosis not present

## 2023-02-06 DIAGNOSIS — E1140 Type 2 diabetes mellitus with diabetic neuropathy, unspecified: Secondary | ICD-10-CM | POA: Diagnosis not present

## 2023-02-06 DIAGNOSIS — I129 Hypertensive chronic kidney disease with stage 1 through stage 4 chronic kidney disease, or unspecified chronic kidney disease: Secondary | ICD-10-CM | POA: Diagnosis not present

## 2023-02-06 DIAGNOSIS — E1121 Type 2 diabetes mellitus with diabetic nephropathy: Secondary | ICD-10-CM | POA: Diagnosis not present

## 2023-02-06 DIAGNOSIS — R1311 Dysphagia, oral phase: Secondary | ICD-10-CM | POA: Diagnosis not present

## 2023-02-07 DIAGNOSIS — R1311 Dysphagia, oral phase: Secondary | ICD-10-CM | POA: Diagnosis not present

## 2023-02-07 DIAGNOSIS — I10 Essential (primary) hypertension: Secondary | ICD-10-CM | POA: Diagnosis not present

## 2023-02-07 DIAGNOSIS — M6281 Muscle weakness (generalized): Secondary | ICD-10-CM | POA: Diagnosis not present

## 2023-02-07 DIAGNOSIS — E1121 Type 2 diabetes mellitus with diabetic nephropathy: Secondary | ICD-10-CM | POA: Diagnosis not present

## 2023-02-08 DIAGNOSIS — E1121 Type 2 diabetes mellitus with diabetic nephropathy: Secondary | ICD-10-CM | POA: Diagnosis not present

## 2023-02-08 DIAGNOSIS — M6281 Muscle weakness (generalized): Secondary | ICD-10-CM | POA: Diagnosis not present

## 2023-02-08 DIAGNOSIS — R1311 Dysphagia, oral phase: Secondary | ICD-10-CM | POA: Diagnosis not present

## 2023-02-09 DIAGNOSIS — M6281 Muscle weakness (generalized): Secondary | ICD-10-CM | POA: Diagnosis not present

## 2023-02-09 DIAGNOSIS — E1121 Type 2 diabetes mellitus with diabetic nephropathy: Secondary | ICD-10-CM | POA: Diagnosis not present

## 2023-02-09 DIAGNOSIS — R1311 Dysphagia, oral phase: Secondary | ICD-10-CM | POA: Diagnosis not present

## 2023-02-10 ENCOUNTER — Emergency Department (HOSPITAL_COMMUNITY): Payer: 59

## 2023-02-10 ENCOUNTER — Other Ambulatory Visit: Payer: Self-pay

## 2023-02-10 ENCOUNTER — Inpatient Hospital Stay (HOSPITAL_COMMUNITY)
Admission: EM | Admit: 2023-02-10 | Discharge: 2023-03-19 | DRG: 870 | Disposition: E | Payer: 59 | Source: Skilled Nursing Facility | Attending: Internal Medicine | Admitting: Internal Medicine

## 2023-02-10 ENCOUNTER — Inpatient Hospital Stay (HOSPITAL_COMMUNITY): Payer: 59

## 2023-02-10 ENCOUNTER — Encounter (HOSPITAL_COMMUNITY): Payer: Self-pay

## 2023-02-10 DIAGNOSIS — Z801 Family history of malignant neoplasm of trachea, bronchus and lung: Secondary | ICD-10-CM

## 2023-02-10 DIAGNOSIS — F32A Depression, unspecified: Secondary | ICD-10-CM | POA: Diagnosis present

## 2023-02-10 DIAGNOSIS — N390 Urinary tract infection, site not specified: Secondary | ICD-10-CM | POA: Diagnosis present

## 2023-02-10 DIAGNOSIS — A0472 Enterocolitis due to Clostridium difficile, not specified as recurrent: Secondary | ICD-10-CM | POA: Diagnosis present

## 2023-02-10 DIAGNOSIS — R1110 Vomiting, unspecified: Secondary | ICD-10-CM | POA: Diagnosis not present

## 2023-02-10 DIAGNOSIS — E1122 Type 2 diabetes mellitus with diabetic chronic kidney disease: Secondary | ICD-10-CM | POA: Diagnosis present

## 2023-02-10 DIAGNOSIS — K8020 Calculus of gallbladder without cholecystitis without obstruction: Secondary | ICD-10-CM | POA: Diagnosis not present

## 2023-02-10 DIAGNOSIS — J90 Pleural effusion, not elsewhere classified: Secondary | ICD-10-CM | POA: Diagnosis not present

## 2023-02-10 DIAGNOSIS — J9601 Acute respiratory failure with hypoxia: Principal | ICD-10-CM | POA: Diagnosis present

## 2023-02-10 DIAGNOSIS — R0989 Other specified symptoms and signs involving the circulatory and respiratory systems: Secondary | ICD-10-CM | POA: Diagnosis not present

## 2023-02-10 DIAGNOSIS — Z9911 Dependence on respirator [ventilator] status: Secondary | ICD-10-CM

## 2023-02-10 DIAGNOSIS — Z8546 Personal history of malignant neoplasm of prostate: Secondary | ICD-10-CM

## 2023-02-10 DIAGNOSIS — Z515 Encounter for palliative care: Secondary | ICD-10-CM

## 2023-02-10 DIAGNOSIS — G009 Bacterial meningitis, unspecified: Secondary | ICD-10-CM | POA: Diagnosis present

## 2023-02-10 DIAGNOSIS — Z1152 Encounter for screening for COVID-19: Secondary | ICD-10-CM

## 2023-02-10 DIAGNOSIS — E876 Hypokalemia: Secondary | ICD-10-CM | POA: Diagnosis present

## 2023-02-10 DIAGNOSIS — A414 Sepsis due to anaerobes: Secondary | ICD-10-CM | POA: Diagnosis present

## 2023-02-10 DIAGNOSIS — Z4682 Encounter for fitting and adjustment of non-vascular catheter: Secondary | ICD-10-CM | POA: Diagnosis not present

## 2023-02-10 DIAGNOSIS — R7401 Elevation of levels of liver transaminase levels: Secondary | ICD-10-CM | POA: Diagnosis present

## 2023-02-10 DIAGNOSIS — R404 Transient alteration of awareness: Secondary | ICD-10-CM | POA: Diagnosis not present

## 2023-02-10 DIAGNOSIS — Z1621 Resistance to vancomycin: Secondary | ICD-10-CM | POA: Diagnosis present

## 2023-02-10 DIAGNOSIS — Z7982 Long term (current) use of aspirin: Secondary | ICD-10-CM

## 2023-02-10 DIAGNOSIS — R6521 Severe sepsis with septic shock: Secondary | ICD-10-CM | POA: Diagnosis present

## 2023-02-10 DIAGNOSIS — E440 Moderate protein-calorie malnutrition: Secondary | ICD-10-CM | POA: Insufficient documentation

## 2023-02-10 DIAGNOSIS — D61818 Other pancytopenia: Secondary | ICD-10-CM | POA: Diagnosis present

## 2023-02-10 DIAGNOSIS — Z6822 Body mass index (BMI) 22.0-22.9, adult: Secondary | ICD-10-CM

## 2023-02-10 DIAGNOSIS — N1330 Unspecified hydronephrosis: Secondary | ICD-10-CM | POA: Diagnosis present

## 2023-02-10 DIAGNOSIS — Z66 Do not resuscitate: Secondary | ICD-10-CM | POA: Diagnosis present

## 2023-02-10 DIAGNOSIS — L8990 Pressure ulcer of unspecified site, unspecified stage: Secondary | ICD-10-CM | POA: Insufficient documentation

## 2023-02-10 DIAGNOSIS — M109 Gout, unspecified: Secondary | ICD-10-CM | POA: Diagnosis present

## 2023-02-10 DIAGNOSIS — A419 Sepsis, unspecified organism: Principal | ICD-10-CM | POA: Diagnosis present

## 2023-02-10 DIAGNOSIS — G9340 Encephalopathy, unspecified: Secondary | ICD-10-CM | POA: Diagnosis not present

## 2023-02-10 DIAGNOSIS — I129 Hypertensive chronic kidney disease with stage 1 through stage 4 chronic kidney disease, or unspecified chronic kidney disease: Secondary | ICD-10-CM | POA: Diagnosis present

## 2023-02-10 DIAGNOSIS — N179 Acute kidney failure, unspecified: Secondary | ICD-10-CM

## 2023-02-10 DIAGNOSIS — Z7902 Long term (current) use of antithrombotics/antiplatelets: Secondary | ICD-10-CM

## 2023-02-10 DIAGNOSIS — N189 Chronic kidney disease, unspecified: Secondary | ICD-10-CM | POA: Diagnosis not present

## 2023-02-10 DIAGNOSIS — Z800 Family history of malignant neoplasm of digestive organs: Secondary | ICD-10-CM

## 2023-02-10 DIAGNOSIS — M47812 Spondylosis without myelopathy or radiculopathy, cervical region: Secondary | ICD-10-CM | POA: Diagnosis present

## 2023-02-10 DIAGNOSIS — Z955 Presence of coronary angioplasty implant and graft: Secondary | ICD-10-CM

## 2023-02-10 DIAGNOSIS — R339 Retention of urine, unspecified: Secondary | ICD-10-CM | POA: Diagnosis not present

## 2023-02-10 DIAGNOSIS — Z2239 Carrier of other specified bacterial diseases: Secondary | ICD-10-CM | POA: Diagnosis not present

## 2023-02-10 DIAGNOSIS — E119 Type 2 diabetes mellitus without complications: Secondary | ICD-10-CM | POA: Diagnosis not present

## 2023-02-10 DIAGNOSIS — E781 Pure hyperglyceridemia: Secondary | ICD-10-CM | POA: Diagnosis present

## 2023-02-10 DIAGNOSIS — I700 Atherosclerosis of aorta: Secondary | ICD-10-CM | POA: Diagnosis not present

## 2023-02-10 DIAGNOSIS — R402431 Glasgow coma scale score 3-8, in the field [EMT or ambulance]: Secondary | ICD-10-CM | POA: Diagnosis not present

## 2023-02-10 DIAGNOSIS — M4312 Spondylolisthesis, cervical region: Secondary | ICD-10-CM | POA: Diagnosis not present

## 2023-02-10 DIAGNOSIS — M4802 Spinal stenosis, cervical region: Secondary | ICD-10-CM | POA: Diagnosis not present

## 2023-02-10 DIAGNOSIS — I499 Cardiac arrhythmia, unspecified: Secondary | ICD-10-CM | POA: Diagnosis not present

## 2023-02-10 DIAGNOSIS — J9690 Respiratory failure, unspecified, unspecified whether with hypoxia or hypercapnia: Secondary | ICD-10-CM | POA: Diagnosis not present

## 2023-02-10 DIAGNOSIS — N1831 Chronic kidney disease, stage 3a: Secondary | ICD-10-CM | POA: Diagnosis present

## 2023-02-10 DIAGNOSIS — I10 Essential (primary) hypertension: Secondary | ICD-10-CM | POA: Diagnosis not present

## 2023-02-10 DIAGNOSIS — K219 Gastro-esophageal reflux disease without esophagitis: Secondary | ICD-10-CM | POA: Diagnosis present

## 2023-02-10 DIAGNOSIS — T361X5A Adverse effect of cephalosporins and other beta-lactam antibiotics, initial encounter: Secondary | ICD-10-CM | POA: Diagnosis not present

## 2023-02-10 DIAGNOSIS — Z8614 Personal history of Methicillin resistant Staphylococcus aureus infection: Secondary | ICD-10-CM

## 2023-02-10 DIAGNOSIS — J984 Other disorders of lung: Secondary | ICD-10-CM | POA: Diagnosis not present

## 2023-02-10 DIAGNOSIS — K649 Unspecified hemorrhoids: Secondary | ICD-10-CM | POA: Diagnosis present

## 2023-02-10 DIAGNOSIS — Z79899 Other long term (current) drug therapy: Secondary | ICD-10-CM

## 2023-02-10 DIAGNOSIS — E11649 Type 2 diabetes mellitus with hypoglycemia without coma: Secondary | ICD-10-CM | POA: Diagnosis present

## 2023-02-10 DIAGNOSIS — F0393 Unspecified dementia, unspecified severity, with mood disturbance: Secondary | ICD-10-CM | POA: Diagnosis present

## 2023-02-10 DIAGNOSIS — R569 Unspecified convulsions: Secondary | ICD-10-CM | POA: Diagnosis not present

## 2023-02-10 DIAGNOSIS — R918 Other nonspecific abnormal finding of lung field: Secondary | ICD-10-CM | POA: Diagnosis not present

## 2023-02-10 DIAGNOSIS — R6889 Other general symptoms and signs: Secondary | ICD-10-CM | POA: Diagnosis not present

## 2023-02-10 DIAGNOSIS — E1165 Type 2 diabetes mellitus with hyperglycemia: Secondary | ICD-10-CM | POA: Diagnosis present

## 2023-02-10 DIAGNOSIS — Z833 Family history of diabetes mellitus: Secondary | ICD-10-CM

## 2023-02-10 DIAGNOSIS — I2510 Atherosclerotic heart disease of native coronary artery without angina pectoris: Secondary | ICD-10-CM | POA: Diagnosis present

## 2023-02-10 DIAGNOSIS — G9341 Metabolic encephalopathy: Secondary | ICD-10-CM | POA: Diagnosis present

## 2023-02-10 DIAGNOSIS — R000 Tachycardia, unspecified: Secondary | ICD-10-CM | POA: Diagnosis not present

## 2023-02-10 DIAGNOSIS — E860 Dehydration: Secondary | ICD-10-CM | POA: Diagnosis present

## 2023-02-10 DIAGNOSIS — R6520 Severe sepsis without septic shock: Secondary | ICD-10-CM | POA: Diagnosis not present

## 2023-02-10 DIAGNOSIS — Z794 Long term (current) use of insulin: Secondary | ICD-10-CM

## 2023-02-10 DIAGNOSIS — E871 Hypo-osmolality and hyponatremia: Secondary | ICD-10-CM | POA: Diagnosis present

## 2023-02-10 DIAGNOSIS — Z8249 Family history of ischemic heart disease and other diseases of the circulatory system: Secondary | ICD-10-CM

## 2023-02-10 DIAGNOSIS — Z743 Need for continuous supervision: Secondary | ICD-10-CM | POA: Diagnosis not present

## 2023-02-10 DIAGNOSIS — Z0389 Encounter for observation for other suspected diseases and conditions ruled out: Secondary | ICD-10-CM | POA: Diagnosis not present

## 2023-02-10 DIAGNOSIS — G319 Degenerative disease of nervous system, unspecified: Secondary | ICD-10-CM | POA: Diagnosis not present

## 2023-02-10 DIAGNOSIS — E872 Acidosis, unspecified: Secondary | ICD-10-CM | POA: Diagnosis present

## 2023-02-10 DIAGNOSIS — Z452 Encounter for adjustment and management of vascular access device: Secondary | ICD-10-CM | POA: Diagnosis not present

## 2023-02-10 DIAGNOSIS — Z808 Family history of malignant neoplasm of other organs or systems: Secondary | ICD-10-CM

## 2023-02-10 DIAGNOSIS — I6782 Cerebral ischemia: Secondary | ICD-10-CM | POA: Diagnosis not present

## 2023-02-10 LAB — GASTROINTESTINAL PANEL BY PCR, STOOL (REPLACES STOOL CULTURE)

## 2023-02-10 LAB — CBC WITH DIFFERENTIAL/PLATELET
Abs Immature Granulocytes: 0.07 10*3/uL (ref 0.00–0.07)
Basophils Absolute: 0.1 10*3/uL (ref 0.0–0.1)
Basophils Relative: 1 %
Eosinophils Absolute: 0 10*3/uL (ref 0.0–0.5)
Eosinophils Relative: 0 %
HCT: 50.5 % (ref 39.0–52.0)
Hemoglobin: 16.9 g/dL (ref 13.0–17.0)
Immature Granulocytes: 1 %
Lymphocytes Relative: 8 %
Lymphs Abs: 0.5 10*3/uL — ABNORMAL LOW (ref 0.7–4.0)
MCH: 29.3 pg (ref 26.0–34.0)
MCHC: 33.5 g/dL (ref 30.0–36.0)
MCV: 87.7 fL (ref 80.0–100.0)
Monocytes Absolute: 0.3 10*3/uL (ref 0.1–1.0)
Monocytes Relative: 5 %
Neutro Abs: 4.8 10*3/uL (ref 1.7–7.7)
Neutrophils Relative %: 85 %
Platelets: 195 10*3/uL (ref 150–400)
RBC: 5.76 MIL/uL (ref 4.22–5.81)
RDW: 20.3 % — ABNORMAL HIGH (ref 11.5–15.5)
WBC: 5.7 10*3/uL (ref 4.0–10.5)
nRBC: 0 % (ref 0.0–0.2)

## 2023-02-10 LAB — TSH: TSH: 0.796 u[IU]/mL (ref 0.350–4.500)

## 2023-02-10 LAB — URINALYSIS, W/ REFLEX TO CULTURE (INFECTION SUSPECTED)
Bilirubin Urine: NEGATIVE
Glucose, UA: >=500 mg/dL — AB
Ketones, ur: NEGATIVE mg/dL
Nitrite: NEGATIVE
Protein, ur: 100 mg/dL — AB
RBC / HPF: >50 RBC/hpf (ref 0–5)
Specific Gravity, Urine: 1.02 (ref 1.005–1.030)
WBC, UA: >50 WBC/hpf (ref 0–5)
pH: 5 (ref 5.0–8.0)

## 2023-02-10 LAB — COMPREHENSIVE METABOLIC PANEL
ALT: 32 U/L (ref 0–44)
AST: 58 U/L — ABNORMAL HIGH (ref 15–41)
Albumin: 2.7 g/dL — ABNORMAL LOW (ref 3.5–5.0)
Alkaline Phosphatase: 204 U/L — ABNORMAL HIGH (ref 38–126)
Anion gap: 17 — ABNORMAL HIGH (ref 5–15)
BUN: 68 mg/dL — ABNORMAL HIGH (ref 8–23)
CO2: 20 mmol/L — ABNORMAL LOW (ref 22–32)
Calcium: 8.1 mg/dL — ABNORMAL LOW (ref 8.9–10.3)
Chloride: 107 mmol/L (ref 98–111)
Creatinine, Ser: 3.72 mg/dL — ABNORMAL HIGH (ref 0.61–1.24)
GFR, Estimated: 17 mL/min — ABNORMAL LOW (ref >60)
Glucose, Bld: 75 mg/dL (ref 70–99)
Potassium: 3.7 mmol/L (ref 3.5–5.1)
Sodium: 144 mmol/L (ref 135–145)
Total Bilirubin: 1.2 mg/dL — ABNORMAL HIGH (ref <1.2)
Total Protein: 7.1 g/dL (ref 6.5–8.1)

## 2023-02-10 LAB — BLOOD GAS, ARTERIAL
Acid-base deficit: 5.9 mmol/L — ABNORMAL HIGH (ref 0.0–2.0)
Bicarbonate: 18.6 mmol/L — ABNORMAL LOW (ref 20.0–28.0)
Drawn by: 29503
FIO2: 100 %
MECHVT: 510 mL
O2 Saturation: 99.9 %
PEEP: 5 cmH2O
Patient temperature: 37
RATE: 20 {breaths}/min
pCO2 arterial: 33 mm[Hg] (ref 32–48)
pH, Arterial: 7.36 (ref 7.35–7.45)
pO2, Arterial: 143 mm[Hg] — ABNORMAL HIGH (ref 83–108)

## 2023-02-10 LAB — GLUCOSE, CAPILLARY
Glucose-Capillary: 119 mg/dL — ABNORMAL HIGH (ref 70–99)
Glucose-Capillary: 122 mg/dL — ABNORMAL HIGH (ref 70–99)
Glucose-Capillary: 133 mg/dL — ABNORMAL HIGH (ref 70–99)
Glucose-Capillary: 55 mg/dL — ABNORMAL LOW (ref 70–99)
Glucose-Capillary: 76 mg/dL (ref 70–99)
Glucose-Capillary: 85 mg/dL (ref 70–99)

## 2023-02-10 LAB — LACTIC ACID, PLASMA
Lactic Acid, Venous: 4.6 mmol/L (ref 0.5–1.9)
Lactic Acid, Venous: 3.5 mmol/L (ref 0.5–1.9)
Lactic Acid, Venous: 3 mmol/L (ref 0.5–1.9)
Lactic Acid, Venous: 2.5 mmol/L (ref 0.5–1.9)

## 2023-02-10 LAB — BLOOD GAS, VENOUS
Acid-base deficit: 0.9 mmol/L (ref 0.0–2.0)
Bicarbonate: 24.3 mmol/L (ref 20.0–28.0)
O2 Saturation: 27.2 %
Patient temperature: 37
pCO2, Ven: 41 mm[Hg] — ABNORMAL LOW (ref 44–60)
pH, Ven: 7.38 (ref 7.25–7.43)
pO2, Ven: <31 mm[Hg] — CL (ref 32–45)

## 2023-02-10 LAB — I-STAT CHEM 8, ED
BUN: 67 mg/dL — ABNORMAL HIGH (ref 8–23)
Calcium, Ion: 0.97 mmol/L — ABNORMAL LOW (ref 1.15–1.40)
Chloride: 108 mmol/L (ref 98–111)
Creatinine, Ser: 3.7 mg/dL — ABNORMAL HIGH (ref 0.61–1.24)
Glucose, Bld: 76 mg/dL (ref 70–99)
HCT: 52 % (ref 39.0–52.0)
Hemoglobin: 17.7 g/dL — ABNORMAL HIGH (ref 13.0–17.0)
Potassium: 3.6 mmol/L (ref 3.5–5.1)
Sodium: 146 mmol/L — ABNORMAL HIGH (ref 135–145)
TCO2: 23 mmol/L (ref 22–32)

## 2023-02-10 LAB — APTT: aPTT: 30 s (ref 24–36)

## 2023-02-10 LAB — RESP PANEL BY RT-PCR (RSV, FLU A&B, COVID)  RVPGX2
Influenza A by PCR: NEGATIVE
Influenza B by PCR: NEGATIVE
Resp Syncytial Virus by PCR: NEGATIVE
SARS Coronavirus 2 by RT PCR: NEGATIVE

## 2023-02-10 LAB — C DIFFICILE QUICK SCREEN W PCR REFLEX
C Diff antigen: POSITIVE — AB
C Diff toxin: NEGATIVE

## 2023-02-10 LAB — MAGNESIUM: Magnesium: 1.8 mg/dL (ref 1.7–2.4)

## 2023-02-10 LAB — PROTIME-INR
INR: 1.4 — ABNORMAL HIGH (ref 0.8–1.2)
Prothrombin Time: 17.6 s — ABNORMAL HIGH (ref 11.4–15.2)

## 2023-02-10 LAB — AMMONIA: Ammonia: 18 umol/L (ref 9–35)

## 2023-02-10 LAB — CBG MONITORING, ED
Glucose-Capillary: 112 mg/dL — ABNORMAL HIGH (ref 70–99)
Glucose-Capillary: 51 mg/dL — ABNORMAL LOW (ref 70–99)
Glucose-Capillary: 111 mg/dL — ABNORMAL HIGH (ref 70–99)

## 2023-02-10 LAB — I-STAT CG4 LACTIC ACID, ED
Lactic Acid, Venous: 3.8 mmol/L (ref 0.5–1.9)
Lactic Acid, Venous: 4.8 mmol/L (ref 0.5–1.9)

## 2023-02-10 LAB — MRSA NEXT GEN BY PCR, NASAL: MRSA by PCR Next Gen: DETECTED — AB

## 2023-02-10 MED ORDER — SODIUM CHLORIDE 0.9 % IV SOLN
500.0000 mg | INTRAVENOUS | Status: DC
  Administered 2023-02-10 – 2023-02-12 (×3): 500 mg via INTRAVENOUS
  Filled 2023-02-10 (×3): qty 5

## 2023-02-10 MED ORDER — MIDAZOLAM HCL 2 MG/2ML IJ SOLN
INTRAMUSCULAR | Status: AC
  Filled 2023-02-10: qty 2

## 2023-02-10 MED ORDER — SUCCINYLCHOLINE CHLORIDE 200 MG/10ML IV SOSY
PREFILLED_SYRINGE | INTRAVENOUS | Status: AC
  Filled 2023-02-10: qty 10

## 2023-02-10 MED ORDER — FENTANYL BOLUS VIA INFUSION: 25.0000 ug | INTRAVENOUS | Status: DC | PRN

## 2023-02-10 MED ORDER — SODIUM CHLORIDE 0.9 % IV SOLN: 250.0000 mL | INTRAVENOUS | Status: AC

## 2023-02-10 MED ORDER — CHLORHEXIDINE GLUCONATE CLOTH 2 % EX PADS
6.0000 | MEDICATED_PAD | Freq: Every day | CUTANEOUS | Status: DC
  Administered 2023-02-10 – 2023-02-19 (×10): 6 via TOPICAL

## 2023-02-10 MED ORDER — DEXTROSE 50 % IV SOLN
INTRAVENOUS | Status: AC
  Filled 2023-02-10: qty 50

## 2023-02-10 MED ORDER — SODIUM CHLORIDE 0.9 % IV SOLN
2.0000 g | Freq: Once | INTRAVENOUS | Status: AC
  Administered 2023-02-10: 2 g via INTRAVENOUS
  Filled 2023-02-10: qty 12.5

## 2023-02-10 MED ORDER — VANCOMYCIN HCL 750 MG/150ML IV SOLN: 750.0000 mg | INTRAVENOUS | Status: DC

## 2023-02-10 MED ORDER — PIPERACILLIN-TAZOBACTAM 3.375 G IVPB
3.3750 g | Freq: Two times a day (BID) | INTRAVENOUS | Status: DC
  Administered 2023-02-10: 3.375 g via INTRAVENOUS
  Filled 2023-02-10: qty 50

## 2023-02-10 MED ORDER — NOREPINEPHRINE 4 MG/250ML-% IV SOLN
0.0000 ug/min | INTRAVENOUS | Status: DC
  Filled 2023-02-10: qty 250

## 2023-02-10 MED ORDER — POLYETHYLENE GLYCOL 3350 17 G PO PACK: 17.0000 g | PACK | Freq: Every day | ORAL | Status: DC | PRN

## 2023-02-10 MED ORDER — DOCUSATE SODIUM 100 MG PO CAPS
100.0000 mg | ORAL_CAPSULE | Freq: Two times a day (BID) | ORAL | Status: DC | PRN
Start: 2023-02-10 — End: 2023-02-19

## 2023-02-10 MED ORDER — LACTATED RINGERS IV BOLUS
1000.0000 mL | Freq: Once | INTRAVENOUS | Status: AC
  Administered 2023-02-10: 1000 mL via INTRAVENOUS

## 2023-02-10 MED ORDER — CLOPIDOGREL BISULFATE 75 MG PO TABS
75.0000 mg | ORAL_TABLET | Freq: Every day | ORAL | Status: DC
  Administered 2023-02-11 – 2023-02-18 (×8): 75 mg
  Filled 2023-02-10 (×10): qty 1

## 2023-02-10 MED ORDER — NOREPINEPHRINE 4 MG/250ML-% IV SOLN: 2.0000 ug/min | INTRAVENOUS | Status: DC

## 2023-02-10 MED ORDER — ROCURONIUM BROMIDE 10 MG/ML (PF) SYRINGE
PREFILLED_SYRINGE | INTRAVENOUS | Status: AC
  Administered 2023-02-10: 60 mg
  Filled 2023-02-10: qty 10

## 2023-02-10 MED ORDER — PHENYLEPHRINE 80 MCG/ML (10ML) SYRINGE FOR IV PUSH (FOR BLOOD PRESSURE SUPPORT)
80.0000 ug | PREFILLED_SYRINGE | Freq: Once | INTRAVENOUS | Status: AC | PRN
  Administered 2023-02-10: 80 ug via INTRAVENOUS
  Filled 2023-02-10: qty 10

## 2023-02-10 MED ORDER — DEXTROSE 50 % IV SOLN
12.5000 g | INTRAVENOUS | Status: AC
  Administered 2023-02-10: 12.5 g via INTRAVENOUS

## 2023-02-10 MED ORDER — SODIUM CHLORIDE 0.9 % IV SOLN
500.0000 mL | Freq: Once | INTRAVENOUS | Status: AC
  Administered 2023-02-10: 500 mL via INTRAVENOUS

## 2023-02-10 MED ORDER — NOREPINEPHRINE 4 MG/250ML-% IV SOLN
2.0000 ug/min | INTRAVENOUS | Status: DC
  Administered 2023-02-10 – 2023-02-11 (×2): 2 ug/min via INTRAVENOUS
  Administered 2023-02-12: 4 ug/min via INTRAVENOUS
  Filled 2023-02-10 (×3): qty 250

## 2023-02-10 MED ORDER — LACTATED RINGERS IV SOLN: INTRAVENOUS | Status: AC

## 2023-02-10 MED ORDER — DOCUSATE SODIUM 50 MG/5ML PO LIQD
100.0000 mg | Freq: Two times a day (BID) | ORAL | Status: DC
  Administered 2023-02-12 – 2023-02-16 (×7): 100 mg
  Filled 2023-02-10 (×7): qty 10

## 2023-02-10 MED ORDER — SODIUM CHLORIDE 0.9 % IV BOLUS
1000.0000 mL | Freq: Once | INTRAVENOUS | Status: AC
  Administered 2023-02-10: 1000 mL via INTRAVENOUS

## 2023-02-10 MED ORDER — VANCOMYCIN HCL IN DEXTROSE 1-5 GM/200ML-% IV SOLN
1000.0000 mg | Freq: Once | INTRAVENOUS | Status: AC
  Administered 2023-02-10: 1000 mg via INTRAVENOUS
  Filled 2023-02-10: qty 200

## 2023-02-10 MED ORDER — LACTATED RINGERS IV SOLN: INTRAVENOUS | Status: DC

## 2023-02-10 MED ORDER — SODIUM CHLORIDE 0.9 % IV BOLUS (SEPSIS)
1000.0000 mL | Freq: Once | INTRAVENOUS | Status: AC
  Administered 2023-02-10: 1000 mL via INTRAVENOUS

## 2023-02-10 MED ORDER — MEMANTINE HCL 5 MG PO TABS
5.0000 mg | ORAL_TABLET | Freq: Two times a day (BID) | ORAL | Status: DC
  Administered 2023-02-10 – 2023-02-18 (×17): 5 mg
  Filled 2023-02-10 (×19): qty 1

## 2023-02-10 MED ORDER — DEXTROSE 10 % IV SOLN: INTRAVENOUS | Status: AC

## 2023-02-10 MED ORDER — PROPOFOL 1000 MG/100ML IV EMUL
0.0000 ug/kg/min | INTRAVENOUS | Status: DC
  Filled 2023-02-10: qty 100

## 2023-02-10 MED ORDER — FAMOTIDINE 20 MG PO TABS
20.0000 mg | ORAL_TABLET | Freq: Two times a day (BID) | ORAL | Status: DC
  Administered 2023-02-10 – 2023-02-18 (×17): 20 mg
  Filled 2023-02-10 (×19): qty 1

## 2023-02-10 MED ORDER — POLYETHYLENE GLYCOL 3350 17 G PO PACK
17.0000 g | PACK | Freq: Every day | ORAL | Status: DC
  Administered 2023-02-14: 17 g
  Filled 2023-02-10: qty 1

## 2023-02-10 MED ORDER — HEPARIN SODIUM (PORCINE) 5000 UNIT/ML IJ SOLN
5000.0000 [IU] | Freq: Three times a day (TID) | INTRAMUSCULAR | Status: DC
  Administered 2023-02-10 – 2023-02-13 (×8): 5000 [IU] via SUBCUTANEOUS
  Filled 2023-02-10 (×9): qty 1

## 2023-02-10 MED ORDER — FENTANYL CITRATE PF 50 MCG/ML IJ SOSY
50.0000 ug | PREFILLED_SYRINGE | INTRAMUSCULAR | Status: AC | PRN
  Administered 2023-02-10 – 2023-02-11 (×4): 50 ug via INTRAVENOUS
  Filled 2023-02-10 (×5): qty 1

## 2023-02-10 MED ORDER — METRONIDAZOLE 500 MG/100ML IV SOLN
500.0000 mg | Freq: Once | INTRAVENOUS | Status: AC
  Administered 2023-02-10: 500 mg via INTRAVENOUS
  Filled 2023-02-10: qty 100

## 2023-02-10 MED ORDER — ASPIRIN 81 MG PO CHEW
81.0000 mg | CHEWABLE_TABLET | Freq: Every day | ORAL | Status: DC
  Administered 2023-02-11 – 2023-02-18 (×8): 81 mg
  Filled 2023-02-10 (×10): qty 1

## 2023-02-10 MED ORDER — DEXTROSE 50 % IV SOLN
INTRAVENOUS | Status: AC
  Administered 2023-02-10: 12.5 g via INTRAVENOUS
  Filled 2023-02-10: qty 50

## 2023-02-10 MED ORDER — ETOMIDATE 2 MG/ML IV SOLN
INTRAVENOUS | Status: AC
  Administered 2023-02-10: 20 mg
  Filled 2023-02-10: qty 20

## 2023-02-10 NOTE — ED Notes (Signed)
ED Provider at bedside. 

## 2023-02-10 NOTE — Progress Notes (Signed)
Assisted with transporting PT to ICU while on Vent (100% Fi02). Report has been given to ICU RT.

## 2023-02-10 NOTE — ED Provider Notes (Signed)
[place] EMERGENCY DEPARTMENT AT [facility] Provider Note   CSN: [id number] Arrival date & time: 02/10/23  4098     History  No chief complaint on file.   [name] is a 67 y.o. male.  The history is provided by the EMS personnel and medical records.  [name] is a 67 y.o. male who presents to the Emergency Department complaining of altered mental status.  Level 5 caveat due to unresponsiveness.  History is provided by EMS.  Patient presents to the emergency department from [facility] for evaluation of shortness of breath per report he has been unresponsive and altered since Friday.  Very little history available for EMS from staff.  EMS reports sats of 84% and was started on supplemental oxygen. On record review he has a history of Staph aureus UTI with obstruction requiring catheter placement as well as septic shock.    Home Medications Prior to Admission medications   Medication Sig Start Date End Date Taking? Authorizing Provider  amLODipine (NORVASC) 5 MG tablet Take 1 tablet (5 mg total) by mouth daily. 11/01/21   Nooruddin, Jason Fila, MD  aspirin EC 81 MG tablet Take 1 tablet (81 mg total) by mouth daily. Swallow whole. 11/01/21   Nooruddin, Jason Fila, MD  atorvastatin (LIPITOR) 40 MG tablet Take 40 mg by mouth at bedtime. 12/25/22   [provider]  clopidogrel (PLAVIX) 75 MG tablet Take 75 mg by mouth daily.    [provider]  Colchicine 0.6 MG CAPS Take 0.6 mg by mouth every 12 (twelve) hours.    [provider]  dapagliflozin propanediol (FARXIGA) 10 MG TABS tablet Take 1 tablet (10 mg total) by mouth daily. 11/09/21   Nooruddin, Jason Fila, MD  divalproex (DEPAKOTE ER) 250 MG 24 hr tablet Take 250 mg by mouth 2 (two) times daily. 12/09/22   [provider]  finasteride (PROSCAR) 5 MG tablet Take 1 tablet (5 mg total) by mouth daily. 11/01/21   Nooruddin, Jason Fila, MD  insulin lispro (HUMALOG) 100 UNIT/ML KwikPen Inject 4 Units into the  skin 3 (three) times daily before meals. 12/30/21   [provider]  LANTUS SOLOSTAR 100 UNIT/ML Solostar Pen Inject 20 Units into the skin daily. 12/05/22   [provider]  LORazepam (ATIVAN) 0.5 MG tablet Take 1 tablet (0.5 mg total) by mouth every 8 (eight) hours as needed for anxiety. Patient taking differently: Take 0.25 mg by mouth 2 (two) times daily. 01/15/22   Osvaldo Shipper, MD  memantine (NAMENDA) 10 MG tablet Take 10 mg by mouth at bedtime.    [provider]  metoprolol succinate (TOPROL-XL) 25 MG 24 hr tablet Take 1 tablet (25 mg total) by mouth daily. 10/31/21   Nooruddin, Jason Fila, MD  nystatin (MYCOSTATIN/NYSTOP) powder Apply 1 Application topically 3 (three) times daily. To groin    [provider]  OLANZapine (ZYPREXA) 2.5 MG tablet Take 2.5 mg by mouth 2 (two) times daily. 11/21/22   [provider]  sertraline (ZOLOFT) 50 MG tablet Take 1 tablet (50 mg total) by mouth daily. Patient taking differently: Take 75 mg by mouth daily. 09/12/21   [place], Alvira Philips, DO  tamsulosin (FLOMAX) 0.4 MG CAPS capsule Take 1 capsule (0.4 mg total) by mouth daily after supper. 01/02/23   Almon Hercules, MD  Zinc Oxide (DESITIN EX) Apply 1 application  topically as needed (skin irritation to bilateral inner thighs and groin area).    [provider]  Allergies    Haldol [haloperidol]    Review of Systems   Review of Systems  Unable to perform ROS: Mental status change    Physical Exam Updated Vital Signs BP (!) 87/59   Pulse (!) 132   Temp (!) 100.5 F (38.1 C) (Rectal)   Resp (!) 31   SpO2 90%  Physical Exam Vitals and nursing note reviewed.  Constitutional:      General: He is in acute distress.     Appearance: He is well-developed. He is ill-appearing.  HENT:     Head: Normocephalic and atraumatic.     Comments: Dry mucous membranes Cardiovascular:     Rate and Rhythm: Regular rhythm. Tachycardia present.     Heart sounds:  No murmur heard. Pulmonary:     Effort: Pulmonary effort is normal. No respiratory distress.     Comments: Crackles in the right lung base Abdominal:     Palpations: Abdomen is soft.     Tenderness: There is no abdominal tenderness. There is no guarding or rebound.  Musculoskeletal:        General: No swelling or tenderness.  Skin:    General: Skin is warm and dry.     Comments: Delayed cap refill  Neurological:     Comments: Nonverbal.  He does have a weak cough.  Weakly grips bilateral upper extremities.  He withdraws to pain in the left lower extremity greater than the right lower extremity but he does withdraw this pain in the bilateral lower extremities.  Does not track.  Eyes midline     ED Results / Procedures / Treatments   Labs (all labs ordered are listed, but only abnormal results are displayed) Labs Reviewed  CBC WITH DIFFERENTIAL/PLATELET - Abnormal; Notable for the following components:      Result Value   RDW 20.3 (*)    All other components within normal limits  PROTIME-INR - Abnormal; Notable for the following components:   Prothrombin Time 17.6 (*)    INR 1.4 (*)    All other components within normal limits  BLOOD GAS, VENOUS - Abnormal; Notable for the following components:   pCO2, Ven 41 (*)    pO2, Ven <31 (*)    All other components within normal limits  I-STAT CG4 LACTIC ACID, ED - Abnormal; Notable for the following components:   Lactic Acid, Venous 3.8 (*)    All other components within normal limits  I-STAT CHEM 8, ED - Abnormal; Notable for the following components:   Sodium 146 (*)    BUN 67 (*)    Creatinine, Ser 3.70 (*)    Calcium, Ion 0.97 (*)    Hemoglobin 17.7 (*)    All other components within normal limits  CBG MONITORING, ED - Abnormal; Notable for the following components:   Glucose-Capillary 51 (*)    All other components within normal limits  RESP PANEL BY RT-PCR (RSV, FLU A&B, COVID)  RVPGX2  CULTURE, BLOOD (ROUTINE X 2)   CULTURE, BLOOD (ROUTINE X 2)  APTT  COMPREHENSIVE METABOLIC PANEL  URINALYSIS, W/ REFLEX TO CULTURE (INFECTION SUSPECTED)  AMMONIA    EKG EKG Interpretation Date/Time:  Monday February 10 2023 07:12:18 EST Ventricular Rate:  132 PR Interval:  110 QRS Duration:  90 QT Interval:  313 QTC Calculation: 464 R Axis:   34  Text Interpretation: Sinus tachycardia Nonspecific repol abnormality, diffuse leads Confirmed by Tilden Fossa [phone]) on 02/10/2023 7:38:14 AM  Radiology DG Chest [facility] 1 [address]  Result Date: 02/10/2023 CLINICAL DATA:  67 year old male with possible sepsis. EXAM: PORTABLE CHEST 1 VIEW COMPARISON:  Portable chest 12/31/2022 and earlier. FINDINGS: Portable AP semi upright view at 0732 hours. Mildly rotated to the right. Larger lung volumes. Normal cardiac size and mediastinal contours. Allowing for portable technique the lungs are clear. Skin fold artifact in the right chest. No pneumothorax or pleural effusion. No acute osseous abnormality identified. Negative visible bowel gas. IMPRESSION: Negative portable chest. Electronically Signed   By: Odessa Fleming M.D.   On: 02/10/2023 07:39    Procedures Procedures   CRITICAL CARE Performed by: Tilden Fossa   Total critical care time: 40 minutes  Critical care time was exclusive of separately billable procedures and treating other patients.  Critical care was necessary to treat or prevent imminent or life-threatening deterioration.  Critical care was time spent personally by me on the following activities: development of treatment plan with patient and/or surrogate as well as nursing, discussions with consultants, evaluation of patient's response to treatment, examination of patient, obtaining history from patient or surrogate, ordering and performing treatments and interventions, ordering and review of laboratory studies, ordering and review of radiographic studies, pulse oximetry and re-evaluation of patient's  condition.  Medications Ordered in ED Medications  sodium chloride 0.9 % bolus 1,000 mL (1,000 mLs Intravenous New Bag/Given 02/10/23 0751)  vancomycin (VANCOCIN) IVPB 1000 mg/200 mL premix (1,000 mg Intravenous New Bag/Given 02/10/23 0756)  ceFEPIme (MAXIPIME) 2 g in sodium chloride 0.9 % 100 mL IVPB (2 g Intravenous New Bag/Given 02/10/23 0746)  norepinephrine (LEVOPHED) 4mg  in (0.016 mg/mL) premix infusion (has no administration in time range)  etomidate (AMIDATE) 2 MG/ML injection (has no administration in time range)  rocuronium (ZEMURON) 100 MG/10ML injection (has no administration in time range)  midazolam (VERSED) 2 MG/2ML injection (has no administration in time range)  succinylcholine (ANECTINE) 200 MG/10ML syringe (has no administration in time range)  dextrose 50 % solution (has no administration in time range)  sodium chloride 0.9 % bolus 1,000 mL (1,000 mLs Intravenous New Bag/Given 02/10/23 0750)    ED Course/ Medical Decision Making/ A&P                                 Medical Decision Making Amount and/or Complexity of Data Reviewed Labs: ordered. Radiology: ordered. ECG/medicine tests: ordered.  Risk Prescription drug management.   Patient with history of diabetes, sepsis secondary to MRSA UTI.  Patient ill-appearing on evaluation appears septic, poor perfusion.  Started on IV fluid resuscitation, broad-spectrum antibiotics for possible pneumonia or UTI.  CBG prehospital was 100, CBG in ED of 50, will treat with dextrose.  Discussed with patient's sister over the phone advanced directives-she is unaware of patient's wishes, has not seen him recently.  She states 1 year ago he was ambulatory but cannot comment on recent status.  She does state he has been calling less frequently.  Given hypoglycemia may be a confounder in his mental status will treat with dextrose prior to pursuing mechanical ventilation.  Also feel patient will benefit from volume resuscitation  prior to considering RSI.  I-STAT with acute renal failure, appropriate potassium.  Patient care transferred pending labs, additional workup and treatment.        Final Clinical Impression(s) / ED Diagnoses Final diagnoses:  None    Rx / DC Orders ED Discharge Orders     None  Tilden Fossa, MD 02/10/23 [phone]

## 2023-02-10 NOTE — ED Notes (Signed)
1410 20 etomidate given 1411 60 rocc given in r forearm 1412 of phenyephrine given 1413 7.5 size ETT placed, marked 25 at the lip with positive color change  2fr OG placed with positive gastric content aspiration and positive auscultation, Flexiseal placed d/t pt diarrhea

## 2023-02-10 NOTE — ED Notes (Signed)
MD notified that pt BP is decreasing again. MD replied he would be there to see pt shortly.

## 2023-02-10 NOTE — Progress Notes (Signed)
Pharmacy Antibiotic Note  VOSS KESSELRING is a 67 y.o. male admitted on 02/10/2023 with sepsis, suspected pneumonia and/or colitis or UTI.  PMH include recent MRSA UTI (12/28/22).  Pharmacy has been consulted for Vancomycin and Zosyn dosing.  Plan: Zosyn 3.375g IV Q12H infused over 4hrs.  Vancomycin 1000mg  IV already given in ED. Follow with Vanc 750mg  IV q48h (SCr 3.7, TBW, Vd 0.72, est AUC 462). Measure Vanc levels as needed.  Goal AUC = 400 - 550. Follow up renal function, culture results, and clinical course.   Weight: 62.1 kg (137 lb)  Temp (24hrs), Avg:99.4 F (37.4 C), Min:98.9 F (37.2 C), Max:100.5 F (38.1 C)  Recent Labs  Lab 02/10/23 0720 02/10/23 0730 02/10/23 1025 02/10/23 1057 02/10/23 1327  WBC 5.7  --   --   --   --   CREATININE 3.72* 3.70*  --   --   --   LATICACIDVEN  --  3.8* 4.8* 4.6* 3.5*    Estimated Creatinine Clearance: 16.6 mL/min (A) (by C-G formula based on SCr of 3.7 mg/dL (H)).    Allergies  Allergen Reactions   Haldol [Haloperidol] Other (See Comments)    Per family cause life threatening complications to patient Per Pt, it causes "complications with my mouth"    Antimicrobials this admission: 11/25 Cefepime x1 11/25 metronidazole x1 11/25 Vancomycin >> 11/25 Zosyn >>   Dose adjustments this admission:   Microbiology results: 11/25 Resp panel: neg covid, flu, rsv 11/25 BCx:  11/25 UCx: 11/25 GI panel:  11/25 Cdiff: Ag positive, toxin negative, PCR ip 11/25 Resp cxt: 11/25 MRSA PCR 11/25 Resp panel PCR:   Thank you for allowing pharmacy to be a part of this patient's care.  Lynann Beaver PharmD, BCPS [facility] main pharmacy [phone] 02/10/2023 2:59 PM

## 2023-02-10 NOTE — Progress Notes (Signed)
ED Pharmacy Antibiotic Sign Off An antibiotic consult was received from an ED provider for vancomycin and cefepime per pharmacy dosing for pneumonia/sepsis. A chart review was completed to assess appropriateness.   Last recorded weight was 200lbs (90.9 kg) on 12/27/22. CMP not yet collected. Will conservatively dose vancomycin for now and adjust dosing in the future if therapy is continued.  The following one time order(s) were placed:  Vancomycin 1000mg  IV x1 Cefepime 2g IV x1  Further antibiotic and/or antibiotic pharmacy consults should be ordered by the admitting provider if indicated.    Thank you for allowing pharmacy to be a part of this patient's care.   Cherylin Mylar, PharmD Clinical Pharmacist  11/25/20247:24 AM

## 2023-02-10 NOTE — ED Provider Notes (Addendum)
Physical Exam  BP (!) 125/101   Pulse (!) 151   Temp 99.6 F (37.6 C)   Resp (!) 9   Wt 62.1 kg   SpO2 96%   BMI 22.11 kg/m   Physical Exam  Procedures  .Critical Care  Performed by: Derwood Kaplan, MD Authorized by: Derwood Kaplan, MD   Critical care provider statement:    Critical care time (minutes):  82   Critical care was necessary to treat or prevent imminent or life-threatening deterioration of the following conditions:  Sepsis and shock   Critical care was time spent personally by me on the following activities:  Development of treatment plan with patient or surrogate, discussions with consultants, evaluation of patient's response to treatment, examination of patient, ordering and review of laboratory studies, ordering and review of radiographic studies, ordering and performing treatments and interventions, pulse oximetry, re-evaluation of patient's condition, review of old charts and obtaining history from patient or surrogate Procedure Name: Intubation Date/Time: 02/10/2023 2:21 PM  Performed by: Derwood Kaplan, MDPre-anesthesia Checklist: Patient identified, Patient being monitored, Emergency Drugs available, Timeout performed and Suction available Oxygen Delivery Method: Non-rebreather mask Preoxygenation: Pre-oxygenation with 100% oxygen Induction Type: Rapid sequence Ventilation: Mask ventilation without difficulty Laryngoscope Size: Glidescope and 3 Grade View: Grade II Tube size: 7.5 mm Number of attempts: 1 Placement Confirmation: ETT inserted through vocal cords under direct vision, CO2 detector and Breath sounds checked- equal and bilateral Secured at: 25 cm Tube secured with: ETT holder      ED Course / MDM   Clinical Course as of 02/10/23 1422  Mon Feb 10, 2023  0827 CBG monitoring, ED(!) After amp of d50 blood sugar is improved to 112 [AN]  0828 Comprehensive metabolic panel(!) Patient has elevated creatinine at 3.72.  BUN is also elevated  at 68. We did a bladder scan, he has no urine that could be measured in his bladder.  2 L of IV fluid already ordered.  We will order additional liter of fluid. [AN]  [id number] Comprehensive metabolic panel(!) Elevated LFTs, anion gap, bilirubin all likely because of dehydration and sepsis. [AN]  [id number] I requested nursing staff to remove the existing Foley catheter, placed a new Foley catheter and send that urine for UA and culture.  CT head without contrast, CT abdomen pelvis without contrast ordered.   [AN]  1329 Spoke with Dr. Everardo All, ICU attending.  She had briefly seen the patient.  She recommends admission but after intubation. [AN]  1421 Family consented intubation. Intubated w/o complication. [AN]    Clinical Course User Index [AN] Derwood Kaplan, MD   Medical Decision Making Amount and/or Complexity of Data Reviewed Labs: ordered. Decision-making details documented in ED Course. Radiology: ordered. ECG/medicine tests: ordered.  Risk Prescription drug management. Decision regarding hospitalization.  Assuming care of patient from Dr. Madilyn Hook   Patient in the ED for altered mental status Workup pending. Patient just arrived.  Patient comes from nursing home with altered mental status.  He has been less responsive over the last 2 or 3 days.  Per EMS, patient hypoxic.   Per Dr. Madilyn Hook, patient arrived to the ER hypotensive, tachycardic, febrile and tachypneic.  Minimally responsive, but slightly improved.  She has discussed goals of care with family, who indicated that they do not know what patient's wishes would be.  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  Dr. Madilyn Hook has already ordered broad-spectrum antibiotics and 2 L of resuscitation fluid.  Results pending at this time.  Per chart review, patient was just discharged from the hospital earlier this month: He has PMH of dementia, IDDM-2, HTN, HLD and admitted for severe sepsis due  to Staph aureus UTI.  Patient has a Foley catheter in place.  Upon review of the discharge summary, it appears that patient had complication of acute urinary retention and a Foley catheter was placed.  On my brief exam, patient did open his eyes to noxious stimuli.  Pupils are 2 mm and equal.  Skin is still warm to touch.  Patient is moving his left lower extremity better than right side to noxious stimuli.  It appears that he has history of polio in his records.  Patient's blood glucose came back at 50s during signout.  Amp of D50 already ordered.  @8 :30 AM Sepsis hemodynamic reassessment completed.  Patient's blood pressure has improved to MAP over 65 at this time.  He does not need pressors at this point.  More IV fluid has been ordered.  Patient has a gag reflex.  He is slightly more responsive, although still not following commands.  No need for intubation at this time.  Reassessment at 10:30 AM: Repeat lactate is rising.  4.8 now. Serial lactic acid reordered. Urine analysis concerning for infection.  Reassessment at 11 AM: CT scan head is negative for bleed.  CT abdomen and pelvis without contrast showing colitis.  Flagyl added.  Patient is having loose bowel movements.  Stool studies, C. difficile has already been ordered.   Reassessment at 11:45 AM: Talk to patient's sister.  Discussed goals of care. Patient will be DNR -no CPR. She would be supportive of a putting patient on a vent if he can fight off the septic shock.  She likely would not prefer long-term ventilation.  Patient does not have power of attorney.  He resides at a nursing home and has been there for 6 months.  Patient has many siblings.  The sister from [place], who is phone number will be added to patient's contact list will be the primary spokesperson.  Pt will be placed on high flow oxygen. He is not in respiratory distress, but tachypneic with respiratory rate in the upper needs. Peripheral pressors ordered.   Patient's blood pressure systolic in the 90s at this time.  MAP is close to 70.  Patient will need ICU admission.   Derwood Kaplan, MD 02/10/23 1152      Derwood Kaplan, MD 02/10/23 1422

## 2023-02-10 NOTE — ED Triage Notes (Signed)
Pt BIB EMS from [facility] for SOB. Pt has been on 84% SPO2 since Friday and has not been talking as much. Per EMS pt has had rhonchi in the right lung. Pt was 87% with a non-rebreather and nasal cannula. Pt is only alert to painful stimuli.  140 HR 80/30 84% 18 CO2 30 RR GCS 7  Hx diabetes 105 cbg  20g LAC

## 2023-02-10 NOTE — Sepsis Progress Note (Signed)
Sepsis protocol monitored by eLink ?

## 2023-02-10 NOTE — ED Notes (Signed)
ED TO INPATIENT HANDOFF REPORT  Name/Age/Gender [name] 67 y.o. male  Code Status    Code Status Orders  (From admission, onward)           Start     Ordered   02/10/23 1441  Do not attempt resuscitation (DNR) Pre-Arrest Interventions Desired  Continuous       Question Answer Comment  If pulseless and not breathing No CPR or chest compressions.   In Pre-Arrest Conditions (Patient Has Pulse and Is Breathing) May intubate, use advanced airway interventions and cardioversion/ACLS medications if appropriate or indicated. May transfer to ICU.   Consent: Discussion documented in EHR or advanced directives reviewed      02/10/23 1443           Code Status History     Date Active Date Inactive Code Status Order ID Comments User Context   02/10/2023 1428 02/10/2023 1443 Do not attempt resuscitation (DNR) PRE-ARREST INTERVENTIONS DESIRED [id number]  Lidia Collum, PA-C ED   02/10/2023 1153 02/10/2023 1428 Do not attempt resuscitation (DNR) PRE-ARREST INTERVENTIONS DESIRED [id number]  Derwood Kaplan, MD ED   12/27/2022 2258 01/02/2023 2012 Full Code [id number]  Sunnie Nielsen, DO ED   01/13/2022 1856 01/15/2022 1459 Full Code [id number]  Orland Mustard, MD ED   11/07/2021 1639 11/09/2021 0041 Full Code [id number]  Orbie Pyo, MD Inpatient   10/29/2021 1213 10/31/2021 2129 Full Code [id number]  Doran Stabler, DO ED   09/02/2021 0006 09/11/2021 2059 Full Code [id number]  Synetta Fail, MD ED   05/30/2021 1324 05/30/2021 2346 Full Code [id number]  Gloris Manchester, MD ED   03/24/2021 1549 03/27/2021 1854 Full Code [id number]  Orland Mustard, MD ED   01/05/2021 0355 01/08/2021 1435 Full Code [id number]  John Giovanni, MD ED   10/25/2020 1950 10/30/2020 1513 Full Code [id number]  Rometta Emery, MD ED   09/15/2020 1931 09/17/2020 2013 Full Code [id number]  Synetta Fail, MD ED   07/31/2020 0445 08/03/2020 1629 Full Code [id number]  John Giovanni, MD ED   02/10/2020 1622 02/12/2020  2259 Full Code [id number]  Clydie Braun, MD ED   09/28/2019 1419 10/05/2019 2151 Full Code [id number]  Ollen Bowl, MD ED   09/06/2016 2041 09/11/2016 1530 Full Code [id number]  Malen Gauze, MD Inpatient   08/01/2016 0155 08/08/2016 1632 Full Code [id number]  Crist Fat, MD Inpatient   07/31/2016 0627 08/01/2016 0155 Full Code [id number]  Eduard Clos, MD ED   03/22/2016 2213 03/26/2016 1839 Full Code [id number]  Nadara Mustard, MD Inpatient       Home/SNF/Other Nursing Home  Chief Complaint Acute respiratory failure with hypoxia (HCC) [J96.01]  Level of Care/Admitting Diagnosis ED Disposition     ED Disposition  Admit   Condition  --   Comment  Hospital Area: [facility] [[id number]]  Level of Care: ICU [6]  May admit patient to [facility] or [facility] if equivalent level of care is available:: No  Covid Evaluation: Confirmed COVID Negative  Diagnosis: Acute respiratory failure with hypoxia [facility]) [[id number]]  Admitting Physician: Jennette Dubin  Attending Physician: Luciano Cutter [id number]  Certification:: I certify this patient will need inpatient services for at least 2 midnights          Medical History Past Medical History:  Diagnosis Date   Acute kidney injury superimposed on chronic kidney disease (HCC) 02/10/2020   Acute on chronic kidney failure (HCC) 09/28/2019   Closed  nondisplaced spiral fracture of shaft of right tibia    Community acquired pneumonia 03/24/2021   Complicated urinary tract infection 09/06/2016   Depression    Diabetes mellitus without complication (HCC)    GERD (gastroesophageal reflux disease)    Gout    right foot   Hemorrhoids    Hypertension    Macular degeneration    Microcytic anemia    PNA (pneumonia) 03/26/2021   Prostate cancer (HCC) 08/2009   Tubular adenoma     Allergies Allergies  Allergen Reactions   Haldol [Haloperidol] Other (See Comments)    Per family cause life  threatening complications to patient Per Pt, it causes "complications with my mouth"    IV Location/Drains/Wounds Patient Lines/Drains/Airways Status     Active Line/Drains/Airways     Name Placement date Placement time Site Days   Peripheral IV 02/10/23 20 G Anterior;Right Forearm 02/10/23  0740  Forearm  less than 1   Peripheral IV 02/10/23 20 G Anterior;Left;Proximal Forearm 02/10/23  0700  Forearm  less than 1   Urethral Catheter Tally RN 01/02/23  1118  --  39   Airway 7.5 mm 02/10/23  1415  -- less than 1            Labs/Imaging Results for orders placed or performed during the hospital encounter of 02/10/23 (from the past 48 hour(s))  Blood gas, venous ([facility], [facility], [facility])     Status: Abnormal   Collection Time: 02/10/23  7:18 AM  Result Value Ref Range   pH, Ven 7.38 7.25 - 7.43   pCO2, Ven 41 (L) 44 - 60 mmHg   pO2, Ven <31 (LL) 32 - 45 mmHg    Comment: CRITICAL RESULT CALLED TO, READ BACK BY AND VERIFIED WITH: NAPE,T. RN AT 6433 02/10/23 MULLINS,T    Bicarbonate 24.3 20.0 - 28.0 mmol/L   Acid-base deficit 0.9 0.0 - 2.0 mmol/L   O2 Saturation 27.2 %   Patient temperature 37.0     Comment: Performed at [facility], [address] [place], [place] [address]  Comprehensive metabolic panel     Status: Abnormal   Collection Time: 02/10/23  7:20 AM  Result Value Ref Range   Sodium 144 135 - 145 mmol/L   Potassium 3.7 3.5 - 5.1 mmol/L   Chloride 107 98 - 111 mmol/L   CO2 20 (L) 22 - 32 mmol/L   Glucose, Bld 75 70 - 99 mg/dL    Comment: Glucose reference range applies only to samples taken after fasting for at least 8 hours.   BUN 68 (H) 8 - 23 mg/dL   Creatinine, Ser 8.41 (H) 0.61 - 1.24 mg/dL   Calcium 8.1 (L) 8.9 - 10.3 mg/dL   Total Protein 7.1 6.5 - 8.1 g/dL   Albumin 2.7 (L) 3.5 - 5.0 g/dL   AST 58 (H) 15 - 41 U/L   ALT 32 0 - 44 U/L   Alkaline Phosphatase 204 (H) 38 - 126 U/L   Total Bilirubin 1.2 (H) <1.2 mg/dL   GFR, Estimated 17 (L) >60  mL/min    Comment: (NOTE) Calculated using the CKD-EPI Creatinine Equation (2021)    Anion gap 17 (H) 5 - 15    Comment: Performed at [facility], [address], [place], [place] [address]  CBC with Differential     Status: Abnormal   Collection Time: 02/10/23  7:20 AM  Result Value Ref Range   WBC 5.7 4.0 - 10.5 K/uL  RBC 5.76 4.22 - 5.81 MIL/uL   Hemoglobin 16.9 13.0 - 17.0 g/dL   HCT 21.3 08.6 - 57.8 %   MCV 87.7 80.0 - 100.0 fL   MCH 29.3 26.0 - 34.0 pg   MCHC 33.5 30.0 - 36.0 g/dL   RDW 46.9 (H) 62.9 - 52.8 %   Platelets 195 150 - 400 K/uL   nRBC 0.0 0.0 - 0.2 %   Neutrophils Relative % 85 %   Neutro Abs 4.8 1.7 - 7.7 K/uL   Lymphocytes Relative 8 %   Lymphs Abs 0.5 (L) 0.7 - 4.0 K/uL   Monocytes Relative 5 %   Monocytes Absolute 0.3 0.1 - 1.0 K/uL   Eosinophils Relative 0 %   Eosinophils Absolute 0.0 0.0 - 0.5 K/uL   Basophils Relative 1 %   Basophils Absolute 0.1 0.0 - 0.1 K/uL   WBC Morphology Mild Left Shift (1-5% metas, occ myelo)    Immature Granulocytes 1 %   Abs Immature Granulocytes 0.07 0.00 - 0.07 K/uL   Acanthocytes PRESENT    Burr Cells PRESENT    Ovalocytes PRESENT     Comment: Performed at [facility], [address], [place], [place] [address]  Protime-INR     Status: Abnormal   Collection Time: 02/10/23  7:20 AM  Result Value Ref Range   Prothrombin Time 17.6 (H) 11.4 - 15.2 seconds   INR 1.4 (H) 0.8 - 1.2    Comment: (NOTE) INR goal varies based on device and disease states. Performed at [facility], [address], [place], [place] [address]   APTT     Status: None   Collection Time: 02/10/23  7:20 AM  Result Value Ref Range   aPTT 30 24 - 36 seconds    Comment: Performed at [facility], [address], [place], [place] [address]  I-Stat Lactic Acid, ED     Status: Abnormal   Collection Time: 02/10/23  7:30 AM  Result Value Ref Range   Lactic Acid, Venous 3.8  (HH) 0.5 - 1.9 mmol/L   Comment NOTIFIED PHYSICIAN   I-stat chem 8, ED     Status: Abnormal   Collection Time: 02/10/23  7:30 AM  Result Value Ref Range   Sodium 146 (H) 135 - 145 mmol/L   Potassium 3.6 3.5 - 5.1 mmol/L   Chloride 108 98 - 111 mmol/L   BUN 67 (H) 8 - 23 mg/dL   Creatinine, Ser 6.44 (H) 0.61 - 1.24 mg/dL   Glucose, Bld 76 70 - 99 mg/dL    Comment: Glucose reference range applies only to samples taken after fasting for at least 8 hours.   Calcium, Ion 0.97 (L) 1.15 - 1.40 mmol/L   TCO2 23 22 - 32 mmol/L   Hemoglobin 17.7 (H) 13.0 - 17.0 g/dL   HCT 03.4 74.2 - 59.5 %  CBG monitoring, ED     Status: Abnormal   Collection Time: 02/10/23  7:42 AM  Result Value Ref Range   Glucose-Capillary 51 (L) 70 - 99 mg/dL    Comment: Glucose reference range applies only to samples taken after fasting for at least 8 hours.  CBG monitoring, ED     Status: Abnormal   Collection Time: 02/10/23  8:17 AM  Result Value Ref Range   Glucose-Capillary 112 (H) 70 - 99 mg/dL    Comment: Glucose reference range applies only to samples taken after fasting for at least 8 hours.  Resp panel  by RT-PCR (RSV, Flu A&B, Covid) Anterior Nasal Swab     Status: None   Collection Time: 02/10/23  8:50 AM   Specimen: Anterior Nasal Swab  Result Value Ref Range   SARS Coronavirus 2 by RT PCR NEGATIVE NEGATIVE    Comment: (NOTE) SARS-CoV-2 target nucleic acids are NOT DETECTED.  The SARS-CoV-2 RNA is generally detectable in upper respiratory specimens during the acute phase of infection. The lowest concentration of SARS-CoV-2 viral copies this assay can detect is 138 copies/mL. A negative result does not preclude SARS-Cov-2 infection and should not be used as the sole basis for treatment or other patient management decisions. A negative result may occur with  improper specimen collection/handling, submission of specimen other than nasopharyngeal swab, presence of viral mutation(s) within the areas  targeted by this assay, and inadequate number of viral copies(<138 copies/mL). A negative result must be combined with clinical observations, patient history, and epidemiological information. The expected result is Negative.  Fact Sheet for Patients:  BloggerCourse.com  Fact Sheet for Healthcare Providers:  SeriousBroker.it  This test is no t yet approved or cleared by the [place] FDA and  has been authorized for detection and/or diagnosis of SARS-CoV-2 by FDA under an Emergency Use Authorization (EUA). This EUA will remain  in effect (meaning this test can be used) for the duration of the COVID-19 declaration under Section 564(b)(1) of the Act, 21 U.S.C.section 360bbb-3(b)(1), unless the authorization is terminated  or revoked sooner.       Influenza A by PCR NEGATIVE NEGATIVE   Influenza B by PCR NEGATIVE NEGATIVE    Comment: (NOTE) The Xpert Xpress SARS-CoV-2/FLU/RSV plus assay is intended as an aid in the diagnosis of influenza from Nasopharyngeal swab specimens and should not be used as a sole basis for treatment. Nasal washings and aspirates are unacceptable for Xpert Xpress SARS-CoV-2/FLU/RSV testing.  Fact Sheet for Patients: BloggerCourse.com  Fact Sheet for Healthcare Providers: SeriousBroker.it  This test is not yet approved or cleared by the [place] FDA and has been authorized for detection and/or diagnosis of SARS-CoV-2 by FDA under an Emergency Use Authorization (EUA). This EUA will remain in effect (meaning this test can be used) for the duration of the COVID-19 declaration under Section 564(b)(1) of the Act, 21 U.S.C. section 360bbb-3(b)(1), unless the authorization is terminated or revoked.     Resp Syncytial Virus by PCR NEGATIVE NEGATIVE    Comment: (NOTE) Fact Sheet for Patients: BloggerCourse.com  Fact Sheet for  Healthcare Providers: SeriousBroker.it  This test is not yet approved or cleared by the [place] FDA and has been authorized for detection and/or diagnosis of SARS-CoV-2 by FDA under an Emergency Use Authorization (EUA). This EUA will remain in effect (meaning this test can be used) for the duration of the COVID-19 declaration under Section 564(b)(1) of the Act, 21 U.S.C. section 360bbb-3(b)(1), unless the authorization is terminated or revoked.  Performed at [facility], [address], [place], [place] [address]   Ammonia     Status: None   Collection Time: 02/10/23  8:50 AM  Result Value Ref Range   Ammonia 18 9 - 35 umol/L    Comment: Performed at [facility], [address], [place], [place] [address]  Urinalysis, w/ Reflex to Culture (Infection Suspected) -Urine, Catheterized; Indwelling urinary catheter     Status: Abnormal   Collection Time: 02/10/23 10:12 AM  Result Value Ref Range   Specimen Source URINE, CATHETERIZED    Color,  Urine AMBER (A) YELLOW    Comment: BIOCHEMICALS MAY BE AFFECTED BY COLOR   APPearance CLOUDY (A) CLEAR   Specific Gravity, Urine 1.020 1.005 - 1.030   pH 5.0 5.0 - 8.0   Glucose, UA >=500 (A) NEGATIVE mg/dL   Hgb urine dipstick LARGE (A) NEGATIVE   Bilirubin Urine NEGATIVE NEGATIVE   Ketones, ur NEGATIVE NEGATIVE mg/dL   Protein, ur 130 (A) NEGATIVE mg/dL   Nitrite NEGATIVE NEGATIVE   Leukocytes,Ua LARGE (A) NEGATIVE   RBC / HPF >50 0 - 5 RBC/hpf   WBC, UA >50 0 - 5 WBC/hpf    Comment:        Reflex urine culture not performed if WBC <=10, OR if Squamous epithelial cells >5. If Squamous epithelial cells >5 suggest recollection.    Bacteria, UA FEW (A) NONE SEEN   Squamous Epithelial / HPF 0-5 0 - 5 /HPF   Mucus PRESENT     Comment: Performed at [facility], [address], [place], [place] [address]  I-Stat Lactic Acid, ED     Status: Abnormal    Collection Time: 02/10/23 10:25 AM  Result Value Ref Range   Lactic Acid, Venous 4.8 (HH) 0.5 - 1.9 mmol/L   Comment NOTIFIED PHYSICIAN   Lactic acid, plasma     Status: Abnormal   Collection Time: 02/10/23 10:57 AM  Result Value Ref Range   Lactic Acid, Venous 4.6 (HH) 0.5 - 1.9 mmol/L    Comment: CRITICAL RESULT CALLED TO, READ BACK BY AND VERIFIED WITH Annye Asa RN [phone] 02/10/2023 SL Performed at [facility], [address] [place], [place] [address]   C Difficile Quick Screen w PCR reflex     Status: Abnormal   Collection Time: 02/10/23 11:30 AM   Specimen: STOOL  Result Value Ref Range   C Diff antigen POSITIVE (A) NEGATIVE   C Diff toxin NEGATIVE NEGATIVE   C Diff interpretation Results are indeterminate. See PCR results.     Comment: Performed at [facility], [address] [place], [place] [address]  CBG monitoring, ED     Status: Abnormal   Collection Time: 02/10/23 11:49 AM  Result Value Ref Range   Glucose-Capillary 111 (H) 70 - 99 mg/dL    Comment: Glucose reference range applies only to samples taken after fasting for at least 8 hours.  Lactic acid, plasma     Status: Abnormal   Collection Time: 02/10/23  1:27 PM  Result Value Ref Range   Lactic Acid, Venous 3.5 (HH) 0.5 - 1.9 mmol/L    Comment: CRITICAL RESULT CALLED TO, READ BACK BY AND VERIFIED WITH T. NATE RN 1401 02/10/2023 SL Performed at [facility], [address] [place], [place] [address]    CT ABDOMEN PELVIS WO CONTRAST  Result Date: 02/10/2023 CLINICAL DATA:  Sepsis renal failure, hx of BPH, new foley and hydronephrosis. eval for abscess, prostate inf EXAM: CT ABDOMEN AND PELVIS WITHOUT CONTRAST TECHNIQUE: Multidetector CT imaging of the abdomen and pelvis was performed following the standard protocol without IV contrast. RADIATION DOSE REDUCTION: This exam was performed according to the departmental dose-optimization program which includes  automated exposure control, adjustment of the mA and/or kV according to patient size and/or use of iterative reconstruction technique. COMPARISON:  Abdominopelvic CT 12/27/2022 and 02/28/2022. Chest radiographs 02/10/2023. FINDINGS: Lower chest: Images through the lung bases are degraded by breathing artifact. There is new dense left lower lobe consolidation, incompletely visualized, although  suspicious for pneumonia. Additional patchy airspace opacities are present within right lung base and lingula. No significant pleural or pericardial effusion. Extensive coronary artery atherosclerosis again noted. Hepatobiliary: Mild hepatic steatosis. No focal abnormalities are identified on noncontrast imaging. Small dependent calcified gallstones are again noted without evidence of gallbladder wall thickening or biliary dilatation. Pancreas: Fatty replacement. No focal abnormality, ductal dilatation or surrounding inflammation. Spleen: Normal in size without focal abnormality. Adrenals/Urinary Tract: Both adrenal glands appear normal. Chronic right sided hydronephrosis and right renal cortical thinning are unchanged status post distal ureteral reimplantation. No evidence of left-sided hydronephrosis or urinary tract calculus. Bilateral renal cysts are unchanged; no specific follow-up imaging recommended. Grossly stable bladder wall thickening. Foley catheter is in place. Stomach/Bowel: No enteric contrast administered. The stomach appears unremarkable for its degree of distention. No small bowel distension, wall thickening or surrounding inflammation. The appendix appears normal. Possible mild wall thickening of the descending and sigmoid colon versus incomplete distension. No surrounding inflammation identified. Vascular/Lymphatic: There are no enlarged abdominal or pelvic lymph nodes. Stable central mesenteric calcification. Mild aortic and branch vessel atherosclerosis without evidence of aneurysm. Reproductive: Grossly  stable moderate enlargement of the prostate gland. Other: Stable postsurgical changes in the anterior abdominal wall and mildly prominent fat in both inguinal canals. No ascites or pneumoperitoneum. Musculoskeletal: Chronic severe atrophy of the right hip musculature. No evidence of acute fracture or aggressive osseous lesion. Mild spondylosis. Unless specific follow-up recommendations are mentioned in the findings or impression sections, no imaging follow-up of any mentioned incidental findings is recommended. IMPRESSION: 1. New dense left lower lobe consolidation, incompletely visualized, although suspicious for pneumonia. Additional patchy airspace opacities within the right lung base and lingula. Radiographic follow up recommended. 2. Possible mild wall thickening of the descending and sigmoid colon versus incomplete distension. Correlate clinically for possible colitis. 3. No other acute findings identified in the abdomen or pelvis. 4. Stable chronic right-sided hydronephrosis and right renal cortical thinning status post distal ureteral reimplantation. 5. Cholelithiasis without evidence of cholecystitis or biliary dilatation. 6. Stable moderate enlargement of the prostate gland with bladder wall thickening, likely due to chronic bladder outlet obstruction. Foley catheter in place. 7.  Aortic Atherosclerosis (ICD10-I70.0). Electronically Signed   By: Carey Bullocks M.D.   On: 02/10/2023 10:20   CT Head Wo Contrast  Result Date: 02/10/2023 CLINICAL DATA:  Mental status change, unknown cause EXAM: CT HEAD WITHOUT CONTRAST TECHNIQUE: Contiguous axial images were obtained from the base of the skull through the vertex without intravenous contrast. RADIATION DOSE REDUCTION: This exam was performed according to the departmental dose-optimization program which includes automated exposure control, adjustment of the mA and/or kV according to patient size and/or use of iterative reconstruction technique. COMPARISON:   12/27/2022 FINDINGS: Brain: No evidence of acute infarction, hemorrhage, hydrocephalus, extra-axial collection or mass lesion/mass effect. Patchy low-density changes within the periventricular and subcortical white matter most compatible with chronic microvascular ischemic change. Moderate diffuse cerebral volume loss. Vascular: Atherosclerotic calcifications involving the large vessels of the skull base. No unexpected hyperdense vessel. Skull: Normal. Negative for fracture or focal lesion. Sinuses/Orbits: No acute finding. Other: None. IMPRESSION: 1. No acute intracranial findings. 2. Chronic microvascular ischemic change and cerebral volume loss. Electronically Signed   By: Duanne Guess D.O.   On: 02/10/2023 10:09   DG Chest Port 1 View  Result Date: 02/10/2023 CLINICAL DATA:  67 year old male with possible sepsis. EXAM: PORTABLE CHEST 1 VIEW COMPARISON:  Portable chest 12/31/2022 and earlier. FINDINGS: Portable AP semi  upright view at 0732 hours. Mildly rotated to the right. Larger lung volumes. Normal cardiac size and mediastinal contours. Allowing for portable technique the lungs are clear. Skin fold artifact in the right chest. No pneumothorax or pleural effusion. No acute osseous abnormality identified. Negative visible bowel gas. IMPRESSION: Negative portable chest. Electronically Signed   By: Odessa Fleming M.D.   On: 02/10/2023 07:39    Pending Labs Unresulted Labs (From admission, onward)     Start     Ordered   02/11/23 0500  Triglycerides  (propofol (DIPRIVAN))  Every 72 hours,   R     Comments: While on propofol (DIPRIVAN)    02/10/23 1327   02/11/23 0500  CBC  Tomorrow morning,   R        02/10/23 1428   02/11/23 0500  Comprehensive metabolic panel  Tomorrow morning,   R        02/10/23 1434   02/10/23 1448  MRSA Next Gen by PCR, Nasal  Once,   R        02/10/23 1447   02/10/23 1448  Lactic acid, plasma  (Lactic Acid)  STAT Now then every 3 hours,   R (with STAT occurrences)       02/10/23 1447   02/10/23 1435  Magnesium  Add-on,   AD        02/10/23 1434   02/10/23 1345  TSH  Once,   URGENT        02/10/23 1344   02/10/23 1341  Culture, Respiratory w Gram Stain  Once,   URGENT        02/10/23 1340   02/10/23 1130  C. Diff by PCR, Reflexed  Once,   R        02/10/23 1130   02/10/23 1039  Gastrointestinal Panel by PCR , Stool  (Gastrointestinal Panel by PCR, Stool                                                                                                                                                     **Does Not include CLOSTRIDIUM DIFFICILE testing. **If CDIFF testing is needed, place order from the "C Difficile Testing" order set.**)  Once,   URGENT        02/10/23 1039   02/10/23 1012  Urine Culture  Once,   R        02/10/23 1012   02/10/23 0835  Urine Culture  Once,   URGENT       Question:  Indication  Answer:  Sepsis   02/10/23 0834   02/10/23 0718  Blood Culture (routine x 2)  (Septic presentation on arrival (screening labs, nursing and treatment orders for obvious sepsis))  BLOOD CULTURE X 2,   STAT      02/10/23 0718   Pending  Blood gas, arterial  Once,   R       Comments: Post 1 hr ABG (on 510 (8cc)/ 20/ 100/ 5    Pending            Vitals/Pain Today's Vitals   02/10/23 1240 02/10/23 1300 02/10/23 1400 02/10/23 1415  BP: 107/74 93/65  (!) 125/101  Pulse: (!) 115 (!) 117  (!) 151  Resp: (!) 31 (!) 28  (!) 9  Temp: 99 F (37.2 C) 99.2 F (37.3 C)  99.6 F (37.6 C)  TempSrc:      SpO2: 97% 97%  96%  Weight:   62.1 kg     Isolation Precautions Enteric precautions (UV disinfection)  Medications Medications  midazolam (VERSED) 2 MG/2ML injection (  Not Given 02/10/23 1211)  succinylcholine (ANECTINE) 200 MG/10ML syringe (  Not Given 02/10/23 1212)  lactated ringers infusion ( Intravenous New Bag/Given 02/10/23 1019)  0.9 %  sodium chloride infusion (has no administration in time range)  0.9 %  sodium chloride infusion (has no  administration in time range)  norepinephrine (LEVOPHED) 4mg  in (0.016 mg/mL) premix infusion (has no administration in time range)  propofol (DIPRIVAN) 1000 MG/100ML infusion (has no administration in time range)  fentaNYL (SUBLIMAZE) bolus via infusion 25-100 mcg (has no administration in time range)  docusate (COLACE) 50 MG/5ML liquid 100 mg (has no administration in time range)  polyethylene glycol (MIRALAX / GLYCOLAX) packet 17 g (has no administration in time range)  docusate sodium (COLACE) capsule 100 mg (has no administration in time range)  polyethylene glycol (MIRALAX / GLYCOLAX) packet 17 g (has no administration in time range)  heparin injection 5,000 Units (has no administration in time range)  famotidine (PEPCID) tablet 20 mg (has no administration in time range)  lactated ringers infusion (has no administration in time range)  sodium chloride 0.9 % bolus 1,000 mL (0 mLs Intravenous Stopped 02/10/23 0851)  vancomycin (VANCOCIN) IVPB 1000 mg/200 mL premix (0 mg Intravenous Stopped 02/10/23 0856)  ceFEPIme (MAXIPIME) 2 g in sodium chloride 0.9 % 100 mL IVPB (0 g Intravenous Stopped 02/10/23 0816)  sodium chloride 0.9 % bolus 1,000 mL (0 mLs Intravenous Stopped 02/10/23 1209)  etomidate (AMIDATE) 2 MG/ML injection (20 mg  Given 02/10/23 1410)  rocuronium (ZEMURON) 100 MG/10ML injection (60 mg  Given 02/10/23 1411)  dextrose 50 % solution ( Intravenous Given 02/10/23 0800)  lactated ringers bolus 1,000 mL (0 mLs Intravenous Stopped 02/10/23 1120)  metroNIDAZOLE (FLAGYL) IVPB 500 mg (0 mg Intravenous Stopped 02/10/23 1237)  PHENYLephrine 80 mcg/ml in normal saline Adult IV Push Syringe (For Blood Pressure Support) (80 mcg Intravenous Given 02/10/23 1437)    Mobility non-ambulatory

## 2023-02-10 NOTE — Progress Notes (Addendum)
eLink Physician-Brief Progress Note Patient Name: [name] DOB: March 05, 1956 MRN: [id number]   Date of Service  02/10/2023  HPI/Events of Note  Hypoglycemia and soft MAP  Camera: Discussed with RN. Received d50 with improvement sugar > 100. Asking for dextrose.  Received 500 ml bolus, MAP up to 63.  Encephalopathy, on Vent. Not on sedation. Limited DNR.   eICU Interventions  Start D10 at 10 ml/hr for 6 hrs, stop if sugar over 180 x twice. Start low dose levophed via peripheral line, avoid extravasation-follow protocol. If needing over 8 mcg/min or so will need a central line.      Intervention Category Intermediate Interventions: Other:;Hypotension - evaluation and management  Ranee Gosselin 02/10/2023, 8:42 PM  22:43 asing for a fent gtt instead of PRNS. Hoping to avoid prop for better BP  Camera re eval done. VS stable. In synchrony with Vent.  Discussed with RN. Fentanyl prn push ordered.

## 2023-02-10 NOTE — H&P (Signed)
NAME:  [name], MRN:  [id number], DOB:  [birth year], LOS: 0 ADMISSION DATE:  02/10/2023, CONSULTATION DATE:  11/25 REFERRING MD:  Dr. Rhunette Croft, CHIEF COMPLAINT:  acute respiratory failure w/ hypoxia; encephalopathy   History of Present Illness:  Patient is a 67 yo M w/ pertinent PMH dementia, IDDM-2, HTN, HLD and mood disorder presents to [facility] ED on 11/25 w/ ams.  Patient recently admitted last month w/ confusion/lethargy. Found to have severe sepiss from staph aureus uti. Discharged back to SNF. On 11/25 patient w/ AMS. On ems arrival patient sob and unresponsive. Sats 84% and started on supplemental O2. Transported to [facility] ED.  On arrival to [facility] ED on 11/25, patient remains altered. Febrile 100.5 and tachycardic 120s. BP stable. LA 3.8 and given IV fluids. Placed on NRB w/ sats low 90s. CBG 51 given dextrose. Cultures obtained and started on broad spectrum abx. Vbg 7.38, 41, 31, 24. CXR and CT head w/ no significant findings. Patient having diarrhea while in ED. CT abd/pelvis showing dense LLL consolidation and patchy opacities in Right lung base suspicious for pna; mild wall thickening of descending colon/sigmoid colon possible colitis; stable chronic right sided hydronephrosis. Covid/flu negative. UA w/ large leukocytes and few bacteria. Cdif antigen showing positive antigen and negative toxin; PCR pending. Patient remained encephalopathic and BP remains soft despite IV fluids. On HHFNC 80% fio2. ED plans to intubate given increase in o2 needs and poor mental status. PCCM consulted for icu admission.  Pertinent ED labs: creat 3.72, ast 58, alk phosph 204, AG 17, ammonia 18   Pertinent  Medical History   Past Medical History:  Diagnosis Date   Acute kidney injury superimposed on chronic kidney disease (HCC) 02/10/2020   Acute on chronic kidney failure (HCC) 09/28/2019   Closed nondisplaced spiral fracture of shaft of right tibia    Community acquired pneumonia 03/24/2021   Complicated  urinary tract infection 09/06/2016   Depression    Diabetes mellitus without complication (HCC)    GERD (gastroesophageal reflux disease)    Gout    right foot   Hemorrhoids    Hypertension    Macular degeneration    Microcytic anemia    PNA (pneumonia) 03/26/2021   Prostate cancer (HCC) 08/2009   Tubular adenoma      Significant Hospital Events: Including procedures, antibiotic start and stop dates in addition to other pertinent events   11/25 admitted w/ ams and respiratory failure w/ hypoxia  Interim History / Subjective:  Patient just intubated Given rocuronium/etomidate  Objective   Blood pressure 93/65, pulse (!) 117, temperature 99.2 F (37.3 C), resp. rate (!) 28, SpO2 97%.    FiO2 (%):  [80 %] 80 %  No intake or output data in the 24 hours ending 02/10/23 1324 There were no vitals filed for this visit.  Examination: General:  critically ill appearing on mech vent HEENT: MM pink/moist; ETT in place Neuro: sedate; perrl CV: s1s2, tachy 130s, no m/r/g PULM:  dim clear BS bilaterally; on mech vent PRVC GI: soft, bsx4 active  Extremities: warm/dry, no edema  Skin: no rashes or lesions    Resolved Hospital Problem list     Assessment & Plan:    Acute respiratory failure w/ hypoxia Possible pna: opacities in LLL and RLL Plan: -intubated in ED -LTVV strategy with tidal volumes of 6-8 cc/kg ideal body weight -check ABG and adjust settings accordingly -Wean PEEP/FiO2 for SpO2 >92% -VAP bundle in place -Daily SAT and SBT -PAD protocol  in place -wean sedation for RASS goal 0 to -1 -trach aspirate -zosyn and vanc -check mrsa pcr and rvp  Sepsis -likely 2/2 to pna and uti -also w/ diarrhea; possible colitis w/ possible cdiff antigen but negative toxin; follow pcr and gi panel Plan: -given 4L IV fluids -if map <65 will start on levo -cont zosyn and vanc -trend LA -follow cultures and cdiff pcr  Acute metabolic encephalopathy: likely secondary to  sepsis as above; also hypoxic on arrival -ct head negative, ammonia negative Plan: -limit sedating meds -treat sepsis as above -check tsh  Hypoglycemia IDDM Plan: -hold ssi -cbg monitoring  AKI AGMA: likely related to LA Plan: -iv fluids -Trend BMP / urinary output -Replace electrolytes as indicated -Avoid nephrotoxic agents, ensure adequate renal perfusion  Hx of urinary retention/chronic hydronephrosis with reimplantation of right ureter Plan: -cont foley -monitor UOP -hold finasteride for now   LFT elevation  Plan: -elevated on last admission likely related to sepis; RUQ [place] negative on last admission -trend CMP   Essential HTN HLD CAD Plan: -asa and plavix -hold statin w/ elevated LFTs -hold anti-hypertensives   Mood disorder Dementia Plan: -hold home depakote, sertraline, and zyprexa for now -resume namenda  Best Practice (right click and "Reselect all SmartList Selections" daily)   Diet/type: NPO w/ meds via tube DVT prophylaxis: Heparin subcu   Pressure ulcer(s): not present on admission  GI prophylaxis: H2B Lines: N/A Foley:  Yes, and it is still needed Code Status:  DNR Last date of multidisciplinary goals of care discussion [11/25 patient's sister [name] updated over phone ]  Labs   CBC: Recent Labs  Lab 02/10/23 0720 02/10/23 0730  WBC 5.7  --   NEUTROABS 4.8  --   HGB 16.9 17.7*  HCT 50.5 52.0  MCV 87.7  --   PLT 195  --     Basic Metabolic Panel: Recent Labs  Lab 02/10/23 0720 02/10/23 0730  NA 144 146*  K 3.7 3.6  CL 107 108  CO2 20*  --   GLUCOSE 75 76  BUN 68* 67*  CREATININE 3.72* 3.70*  CALCIUM 8.1*  --    GFR: CrCl cannot be calculated (Unknown ideal weight.). Recent Labs  Lab 02/10/23 0720 02/10/23 0730 02/10/23 1025 02/10/23 1057  WBC 5.7  --   --   --   LATICACIDVEN  --  3.8* 4.8* 4.6*    Liver Function Tests: Recent Labs  Lab 02/10/23 0720  AST 58*  ALT 32  ALKPHOS 204*  BILITOT 1.2*  PROT  7.1  ALBUMIN 2.7*   No results for input(s): "LIPASE", "AMYLASE" in the last 168 hours. Recent Labs  Lab 02/10/23 0850  AMMONIA 18    ABG    Component Value Date/Time   PHART 7.390 12/28/2022 0446   PCO2ART 33.8 12/28/2022 0446   PO2ART 72 (L) 12/28/2022 0446   HCO3 24.3 02/10/2023 0718   TCO2 23 02/10/2023 0730   ACIDBASEDEF 0.9 02/10/2023 0718   O2SAT 27.2 02/10/2023 0718     Coagulation Profile: Recent Labs  Lab 02/10/23 0720  INR 1.4*    Cardiac Enzymes: No results for input(s): "CKTOTAL", "CKMB", "CKMBINDEX", "TROPONINI" in the last 168 hours.  HbA1C: Hemoglobin A1C  Date/Time Value Ref Range Status  06/26/2016 12:00 AM 7.6  Final  06/26/2016 12:00 AM 7.6  Final   Hgb A1c MFr Bld  Date/Time Value Ref Range Status  12/28/2022 12:55 AM 7.0 (H) 4.8 - 5.6 % Final    Comment:    (  NOTE) Pre diabetes:          5.7%-6.4%  Diabetes:              >6.4%  Glycemic control for   <7.0% adults with diabetes   01/13/2022 09:25 PM 9.1 (H) 4.8 - 5.6 % Final    Comment:    (NOTE) Pre diabetes:          5.7%-6.4%  Diabetes:              >6.4%  Glycemic control for   <7.0% adults with diabetes     CBG: Recent Labs  Lab 02/10/23 0742 02/10/23 0817 02/10/23 1149  GLUCAP 51* 112* 111*    Review of Systems:   Patient is encephalopathic and/or intubated; therefore, history has been obtained from chart review.    Past Medical History:  He,  has a past medical history of Acute kidney injury superimposed on chronic kidney disease (HCC) (02/10/2020), Acute on chronic kidney failure (HCC) (09/28/2019), Closed nondisplaced spiral fracture of shaft of right tibia, Community acquired pneumonia (03/24/2021), Complicated urinary tract infection (09/06/2016), Depression, Diabetes mellitus without complication (HCC), GERD (gastroesophageal reflux disease), Gout, Hemorrhoids, Hypertension, Macular degeneration, Microcytic anemia, PNA (pneumonia) (03/26/2021), Prostate cancer  (HCC) (08/2009), and Tubular adenoma.   Surgical History:   Past Surgical History:  Procedure Laterality Date   CORONARY PRESSURE/FFR STUDY N/A 11/07/2021   Procedure: INTRAVASCULAR PRESSURE WIRE/FFR STUDY;  Surgeon: Orbie Pyo, MD;  Location: [facility] INVASIVE CV LAB;  Service: Cardiovascular;  Laterality: N/A;   CORONARY STENT INTERVENTION N/A 10/29/2021   Procedure: CORONARY STENT INTERVENTION;  Surgeon: [place], Peter M, MD;  Location: [facility] INVASIVE CV LAB;  Service: Cardiovascular;  Laterality: N/A;  Mid LAD   CYSTOSCOPY WITH RETROGRADE PYELOGRAM, URETEROSCOPY AND STENT PLACEMENT Right 07/31/2016   Procedure: CYSTOSCOPY WITH RIGHT  RETROGRADE PYELOGRAM, URETEROSCOPY;  Surgeon: Crist Fat, MD;  Location: [facility] ORS;  Service: Urology;  Laterality: Right;   INGUINAL HERNIA REPAIR     Left ankle joint fusion  1981   LEFT HEART CATH AND CORONARY ANGIOGRAPHY N/A 10/29/2021   Procedure: LEFT HEART CATH AND CORONARY ANGIOGRAPHY;  Surgeon: [place], Peter M, MD;  Location: [facility] INVASIVE CV LAB;  Service: Cardiovascular;  Laterality: N/A;   LEFT HEART CATH AND CORONARY ANGIOGRAPHY N/A 11/07/2021   Procedure: LEFT HEART CATH AND CORONARY ANGIOGRAPHY;  Surgeon: Orbie Pyo, MD;  Location: [facility] INVASIVE CV LAB;  Service: Cardiovascular;  Laterality: N/A;   PROSTATE BIOPSY     x 2   URETERAL REIMPLANTION  07/31/2016   Procedure: URETERAL REIMPLANT, right boari flap, right psoas hitch;  Surgeon: Crist Fat, MD;  Location: [facility] ORS;  Service: Urology;;     Social History:   reports that he has never smoked. He has never used smokeless tobacco. He reports that he does not currently use alcohol. He reports that he does not use drugs.   Family History:  His family history includes Diabetes in an other family member; Heart disease in his father; Lung cancer in his maternal uncle and mother; Pancreatic cancer in his father; Throat cancer in his brother.   Allergies Allergies  Allergen Reactions    Haldol [Haloperidol] Other (See Comments)    Per family cause life threatening complications to patient Per Pt, it causes "complications with my mouth"     Home Medications  Prior to Admission medications   Medication Sig Start Date End Date Taking? Authorizing Provider  amLODipine (NORVASC) 5 MG tablet Take  1 tablet (5 mg total) by mouth daily. 11/01/21  Yes Nooruddin, Jason Fila, MD  aspirin EC 81 MG tablet Take 1 tablet (81 mg total) by mouth daily. Swallow whole. 11/01/21  Yes Nooruddin, Jason Fila, MD  atorvastatin (LIPITOR) 40 MG tablet Take 40 mg by mouth at bedtime. 12/25/22  Yes [provider]  clopidogrel (PLAVIX) 75 MG tablet Take 75 mg by mouth daily.   Yes [provider]  Colchicine 0.6 MG CAPS Take 0.6 mg by mouth every 12 (twelve) hours.   Yes [provider]  dapagliflozin propanediol (FARXIGA) 10 MG TABS tablet Take 1 tablet (10 mg total) by mouth daily. 11/09/21  Yes Nooruddin, Jason Fila, MD  divalproex (DEPAKOTE ER) 250 MG 24 hr tablet Take 250 mg by mouth 2 (two) times daily. 12/09/22  Yes [provider]  finasteride (PROSCAR) 5 MG tablet Take 1 tablet (5 mg total) by mouth daily. 11/01/21  Yes Nooruddin, Jason Fila, MD  insulin lispro (HUMALOG) 100 UNIT/ML KwikPen Inject 1-6 Units into the skin 3 (three) times daily before meals. Sliding scale: If 200-250 = 1 unit 251-300 = 2 units 301-350 = 3 units 351-400 = 4 units 401-450 = 5 units 451-500 = 6 units >500 administer 6 units and call MD, subcutaneously with meals 12/30/21  Yes [provider]  LANTUS SOLOSTAR 100 UNIT/ML Solostar Pen Inject 5 Units into the skin in the morning. 12/05/22  Yes [provider]  memantine (NAMENDA) 10 MG tablet Take 10 mg by mouth at bedtime.   Yes [provider]  metoprolol succinate (TOPROL-XL) 25 MG 24 hr tablet Take 1 tablet (25 mg total) by mouth daily. 10/31/21  Yes Nooruddin, Jason Fila, MD  OLANZapine (ZYPREXA) 2.5 MG tablet Take 2.5 mg by mouth 2  (two) times daily. 11/21/22  Yes [provider]  sertraline (ZOLOFT) 50 MG tablet Take 1 tablet (50 mg total) by mouth daily. 09/12/21  Yes [place], Alvira Philips, DO  tamsulosin (FLOMAX) 0.4 MG CAPS capsule Take 1 capsule (0.4 mg total) by mouth daily after supper. 01/02/23  Yes Almon Hercules, MD     Critical care time: 45 minutes    JD Anselm Lis [facility] 02/10/2023, 1:24 PM  Please see [email or web address] for pager details.  From 7A-7P if no response, please call [phone]. After hours, please call ELink [phone].

## 2023-02-11 ENCOUNTER — Inpatient Hospital Stay (HOSPITAL_COMMUNITY)
Admit: 2023-02-11 | Discharge: 2023-02-11 | Disposition: A | Discharge status: Home / Self Care | Payer: 59 | Attending: Physician Assistant

## 2023-02-11 DIAGNOSIS — G9341 Metabolic encephalopathy: Secondary | ICD-10-CM | POA: Diagnosis not present

## 2023-02-11 DIAGNOSIS — R569 Unspecified convulsions: Secondary | ICD-10-CM | POA: Diagnosis not present

## 2023-02-11 DIAGNOSIS — J9601 Acute respiratory failure with hypoxia: Secondary | ICD-10-CM | POA: Diagnosis not present

## 2023-02-11 DIAGNOSIS — A419 Sepsis, unspecified organism: Secondary | ICD-10-CM | POA: Diagnosis not present

## 2023-02-11 DIAGNOSIS — N1831 Chronic kidney disease, stage 3a: Secondary | ICD-10-CM | POA: Diagnosis not present

## 2023-02-11 DIAGNOSIS — R6521 Severe sepsis with septic shock: Secondary | ICD-10-CM

## 2023-02-11 DIAGNOSIS — R6520 Severe sepsis without septic shock: Secondary | ICD-10-CM

## 2023-02-11 LAB — COMPREHENSIVE METABOLIC PANEL
ALT: 26 U/L (ref 0–44)
AST: 57 U/L — ABNORMAL HIGH (ref 15–41)
Albumin: 2.1 g/dL — ABNORMAL LOW (ref 3.5–5.0)
Alkaline Phosphatase: 197 U/L — ABNORMAL HIGH (ref 38–126)
Anion gap: 14 (ref 5–15)
BUN: 63 mg/dL — ABNORMAL HIGH (ref 8–23)
CO2: 17 mmol/L — ABNORMAL LOW (ref 22–32)
Calcium: 7.1 mg/dL — ABNORMAL LOW (ref 8.9–10.3)
Chloride: 111 mmol/L (ref 98–111)
Creatinine, Ser: 2.52 mg/dL — ABNORMAL HIGH (ref 0.61–1.24)
GFR, Estimated: 27 mL/min — ABNORMAL LOW (ref >60)
Glucose, Bld: 124 mg/dL — ABNORMAL HIGH (ref 70–99)
Potassium: 3.3 mmol/L — ABNORMAL LOW (ref 3.5–5.1)
Sodium: 142 mmol/L (ref 135–145)
Total Bilirubin: 1 mg/dL (ref <1.2)
Total Protein: 5.4 g/dL — ABNORMAL LOW (ref 6.5–8.1)

## 2023-02-11 LAB — STREP PNEUMONIAE URINARY ANTIGEN: Strep Pneumo Urinary Antigen: NEGATIVE

## 2023-02-11 LAB — CBC
HCT: 46.9 % (ref 39.0–52.0)
Hemoglobin: 15.1 g/dL (ref 13.0–17.0)
MCH: 28.7 pg (ref 26.0–34.0)
MCHC: 32.2 g/dL (ref 30.0–36.0)
MCV: 89 fL (ref 80.0–100.0)
Platelets: 147 10*3/uL — ABNORMAL LOW (ref 150–400)
RBC: 5.27 MIL/uL (ref 4.22–5.81)
RDW: 19.7 % — ABNORMAL HIGH (ref 11.5–15.5)
WBC: 4 10*3/uL (ref 4.0–10.5)
nRBC: 0 % (ref 0.0–0.2)

## 2023-02-11 LAB — GLUCOSE, CAPILLARY
Glucose-Capillary: 93 mg/dL (ref 70–99)
Glucose-Capillary: 102 mg/dL — ABNORMAL HIGH (ref 70–99)
Glucose-Capillary: 100 mg/dL — ABNORMAL HIGH (ref 70–99)
Glucose-Capillary: 56 mg/dL — ABNORMAL LOW (ref 70–99)
Glucose-Capillary: 64 mg/dL — ABNORMAL LOW (ref 70–99)
Glucose-Capillary: 117 mg/dL — ABNORMAL HIGH (ref 70–99)
Glucose-Capillary: 106 mg/dL — ABNORMAL HIGH (ref 70–99)
Glucose-Capillary: 122 mg/dL — ABNORMAL HIGH (ref 70–99)

## 2023-02-11 LAB — PHOSPHORUS
Phosphorus: 3.4 mg/dL (ref 2.5–4.6)
Phosphorus: 2.6 mg/dL (ref 2.5–4.6)

## 2023-02-11 LAB — CLOSTRIDIUM DIFFICILE BY PCR, REFLEXED: Toxigenic C. Difficile by PCR: POSITIVE — AB

## 2023-02-11 LAB — MAGNESIUM
Magnesium: 1.7 mg/dL (ref 1.7–2.4)
Magnesium: 2.1 mg/dL (ref 1.7–2.4)

## 2023-02-11 LAB — TRIGLYCERIDES: Triglycerides: 228 mg/dL — ABNORMAL HIGH (ref <150)

## 2023-02-11 MED ORDER — VANCOMYCIN 50 MG/ML ORAL SOLUTION
500.0000 mg | Freq: Four times a day (QID) | ORAL | Status: DC
Start: 2023-02-11 — End: 2023-02-19
  Administered 2023-02-11 – 2023-02-19 (×32): 500 mg
  Filled 2023-02-11 (×33): qty 10

## 2023-02-11 MED ORDER — VANCOMYCIN HCL IN DEXTROSE 1-5 GM/200ML-% IV SOLN: 1000.0000 mg | INTRAVENOUS | Status: DC

## 2023-02-11 MED ORDER — FENTANYL CITRATE PF 50 MCG/ML IJ SOSY
25.0000 ug | PREFILLED_SYRINGE | INTRAMUSCULAR | Status: DC | PRN
  Administered 2023-02-12: 25 ug via INTRAVENOUS
  Administered 2023-02-13 – 2023-02-14 (×2): 50 ug via INTRAVENOUS
  Filled 2023-02-11 (×4): qty 1

## 2023-02-11 MED ORDER — PIPERACILLIN-TAZOBACTAM 3.375 G IVPB: 3.3750 g | Freq: Three times a day (TID) | INTRAVENOUS | Status: DC

## 2023-02-11 MED ORDER — PROSOURCE TF20 ENFIT COMPATIBL EN LIQD
60.0000 mL | Freq: Every day | ENTERAL | Status: DC
  Administered 2023-02-11: 60 mL
  Filled 2023-02-11: qty 60

## 2023-02-11 MED ORDER — VANCOMYCIN HCL IN DEXTROSE 1-5 GM/200ML-% IV SOLN
1000.0000 mg | INTRAVENOUS | Status: DC
  Administered 2023-02-12: 1000 mg via INTRAVENOUS
  Filled 2023-02-11: qty 200

## 2023-02-11 MED ORDER — SODIUM CHLORIDE 0.9 % IV SOLN
2.0000 g | INTRAVENOUS | Status: DC
  Administered 2023-02-11: 2 g via INTRAVENOUS
  Filled 2023-02-11: qty 12.5

## 2023-02-11 MED ORDER — LACTATED RINGERS IV BOLUS
1000.0000 mL | Freq: Once | INTRAVENOUS | Status: AC
  Administered 2023-02-11: 1000 mL via INTRAVENOUS

## 2023-02-11 MED ORDER — DEXTROSE 10 % IV SOLN: INTRAVENOUS | Status: DC

## 2023-02-11 MED ORDER — ORAL CARE MOUTH RINSE: 15.0000 mL | OROMUCOSAL | Status: DC | PRN

## 2023-02-11 MED ORDER — METRONIDAZOLE 500 MG/100ML IV SOLN
500.0000 mg | Freq: Three times a day (TID) | INTRAVENOUS | Status: DC
  Administered 2023-02-11 – 2023-02-19 (×24): 500 mg via INTRAVENOUS
  Filled 2023-02-11 (×25): qty 100

## 2023-02-11 MED ORDER — DEXTROSE 50 % IV SOLN
12.5000 g | INTRAVENOUS | Status: AC
  Filled 2023-02-11: qty 50

## 2023-02-11 MED ORDER — FENTANYL CITRATE PF 50 MCG/ML IJ SOSY: 12.5000 ug | PREFILLED_SYRINGE | INTRAMUSCULAR | Status: DC | PRN

## 2023-02-11 MED ORDER — VITAL AF 1.2 CAL PO LIQD
1000.0000 mL | ORAL | Status: DC
  Administered 2023-02-11 – 2023-02-18 (×10): 1000 mL
  Filled 2023-02-11 (×2): qty 1000

## 2023-02-11 MED ORDER — POTASSIUM CHLORIDE 20 MEQ PO PACK
40.0000 meq | PACK | Freq: Once | ORAL | Status: AC
  Administered 2023-02-11: 40 meq
  Filled 2023-02-11: qty 2

## 2023-02-11 MED ORDER — PIVOT 1.5 CAL PO LIQD
1000.0000 mL | ORAL | Status: DC
  Administered 2023-02-11: 1000 mL
  Filled 2023-02-11: qty 1000

## 2023-02-11 MED ORDER — MAGNESIUM SULFATE 2 GM/50ML IV SOLN
2.0000 g | Freq: Once | INTRAVENOUS | Status: AC
  Administered 2023-02-11: 2 g via INTRAVENOUS
  Filled 2023-02-11: qty 50

## 2023-02-11 MED ORDER — ORAL CARE MOUTH RINSE
15.0000 mL | OROMUCOSAL | Status: DC
Start: 2023-02-11 — End: 2023-02-18
  Administered 2023-02-11 – 2023-02-18 (×85): 15 mL via OROMUCOSAL

## 2023-02-11 MED ORDER — FREE WATER
100.0000 mL | Status: DC
  Administered 2023-02-11 – 2023-02-12 (×7): 100 mL

## 2023-02-11 NOTE — Progress Notes (Signed)
PHARMACY NOTE:  ANTIMICROBIAL RENAL DOSAGE ADJUSTMENT  Current antimicrobial regimen includes a mismatch between antimicrobial dosage and estimated renal function.  As per policy approved by the Pharmacy & Therapeutics and Medical Executive Committees, the antimicrobial dosage will be adjusted accordingly.  Current antimicrobial dosage: vancomycin 750 mg q48h, Zosyn 3.375 g q12h  Indication: sepsis  Renal Function:  Estimated Creatinine Clearance: 24.5 mL/min (A) (by C-G formula based on SCr of 2.52 mg/dL (H)).     Antimicrobial dosage has been changed to: vancomycin 1000 mg q48h, Zosyn 3.375 g q8h    Thank you for allowing pharmacy to be a part of this patient's care.  Pricilla Riffle, PharmD, BCPS Clinical Pharmacist 02/11/2023 7:51 AM

## 2023-02-11 NOTE — Progress Notes (Signed)
EEG complete - results pending 

## 2023-02-11 NOTE — Progress Notes (Signed)
NAME:  [name], MRN:  [id number], DOB:  02-12-1956, LOS: 1 ADMISSION DATE:  02/10/2023, CONSULTATION DATE:  11/25 REFERRING MD:  Dr. Rhunette Croft, CHIEF COMPLAINT:  acute respiratory failure w/ hypoxia; encephalopathy   History of Present Illness:  Patient is a 67 yo M w/ pertinent PMH dementia, IDDM-2, HTN, HLD and mood disorder presents to [name] ED on 11/25 w/ ams.  Patient recently admitted last month w/ confusion/lethargy. Found to have severe sepiss from staph aureus uti. Discharged back to SNF. On 11/25 patient w/ AMS. On ems arrival patient sob and unresponsive. Sats 84% and started on supplemental O2. Transported to [name] ED.  On arrival to [name] ED on 11/25, patient remains altered. Febrile 100.5 and tachycardic 120s. BP stable. LA 3.8 and given IV fluids. Placed on NRB w/ sats low 90s. CBG 51 given dextrose. Cultures obtained and started on broad spectrum abx. Vbg 7.38, 41, 31, 24. CXR and CT head w/ no significant findings. Patient having diarrhea while in ED. CT abd/pelvis showing dense LLL consolidation and patchy opacities in Right lung base suspicious for pna; mild wall thickening of descending colon/sigmoid colon possible colitis; stable chronic right sided hydronephrosis. Covid/flu negative. UA w/ large leukocytes and few bacteria. Cdif antigen showing positive antigen and negative toxin; PCR pending. Patient remained encephalopathic and BP remains soft despite IV fluids. On HHFNC 80% fio2. ED plans to intubate given increase in o2 needs and poor mental status. PCCM consulted for icu admission.  Pertinent ED labs: creat 3.72, ast 58, alk phosph 204, AG 17, ammonia 18   Pertinent  Medical History   Past Medical History:  Diagnosis Date   Acute kidney injury superimposed on chronic kidney disease (HCC) 02/10/2020   Acute on chronic kidney failure (HCC) 09/28/2019   Closed nondisplaced spiral fracture of shaft of right tibia    Community acquired pneumonia 03/24/2021   Complicated  urinary tract infection 09/06/2016   Depression    Diabetes mellitus without complication (HCC)    GERD (gastroesophageal reflux disease)    Gout    right foot   Hemorrhoids    Hypertension    Macular degeneration    Microcytic anemia    PNA (pneumonia) 03/26/2021   Prostate cancer (HCC) 08/2009   Tubular adenoma      Significant Hospital Events: Including procedures, antibiotic start and stop dates in addition to other pertinent events   11/25 admitted w/ ams and respiratory failure w/ hypoxia  Interim History / Subjective:  Intubated on PSV trial Currently off sedation; opens eyes to voice; perrl; not moving extremites; weak cough/gag On 6 mcg levo  Objective   Blood pressure 111/70, pulse (!) 115, temperature 98.8 F (37.1 C), resp. rate (!) 22, weight 62.6 kg, SpO2 96%.    Vent Mode: PRVC FiO2 (%):  [40 %-100 %] 40 % Set Rate:  [20 bmp] 20 bmp Vt Set:  [510 mL] 510 mL PEEP:  [5 cmH20] 5 cmH20 Plateau Pressure:  [18 cmH20-21 cmH20] 18 cmH20   Intake/Output Summary (Last 24 hours) at 02/11/2023 0739 Last data filed at 02/11/2023 0700 Gross per 24 hour  Intake 1503.03 ml  Output 300 ml  Net 1203.03 ml   Filed Weights   02/10/23 1400 02/11/23 0500  Weight: 62.1 kg 62.6 kg    Examination: General:  critically ill appearing on mech vent HEENT: MM pink/moist; ETT in place Neuro: off sedation; opens eyes to voice; perrl; not moving extremites; weak cough/gag CV: s1s2, tachy 120s, no m/r/g  PULM:  dim clear BS bilaterally; on mech vent PRVC GI: soft, bsx4 active  Extremities: warm/dry, no edema  Skin: no rashes or lesions    Resolved Hospital Problem list     Assessment & Plan:    Acute respiratory failure w/ hypoxia LLL>RLL opacity; possible pna Plan: -LTVV strategy with tidal volumes of 6-8 cc/kg ideal body weight -Wean PEEP/FiO2 for SpO2 >92% -VAP bundle in place -Daily SAT and SBT -PAD protocol in place -wean sedation for RASS goal 0 to -1 -abx  switched to cefepime, vanc, flagyl -follow cultures -check mrsa pcr and rvp  Septic shock -likely 2/2 to cdiff colitis, pna, and uti -strep pneumonia negative; legionella pending Plan: -levo for map goal >65  -1L bolus -vanc changed to per tube, added flagyl, zosyn changed to cefepime  -follow cultures  Acute metabolic encephalopathy: likely secondary to sepsis as above; also hypoxic on  EMS arrival -ct head negative, ammonia and tsh wnl -per family at baseline patient interactive and believe he may be wheelchair bound Plan: -limit sedating meds -treat sepsis as above -patient opening eyes to voice; if mental status not improving may need to consider checking MRI  Hypoglycemia IDDM Plan: -cont to hold ssi -start TF today -cbg monitoring  AKI Hypokalemia Plan: -replete K and mag -IV fluid bolus today -Trend BMP / urinary output -Replace electrolytes as indicated -Avoid nephrotoxic agents, ensure adequate renal perfusion  Hx of urinary retention/chronic hydronephrosis with reimplantation of right ureter Plan: -cont foley -monitor UOP -hold finasteride for now   LFT elevation  Plan: -elevated on last admission likely related to sepis; RUQ [place] negative on last admission -improving -trend CMP   Essential HTN HLD CAD Plan: -cont home asa and plavix -hold statin w/ elevated LFTs -hold anti-hypertensives   Mood disorder Dementia Plan: -hold home depakote, sertraline, and zyprexa for now -resume namenda  Best Practice (right click and "Reselect all SmartList Selections" daily)   Diet/type: tubefeeds DVT prophylaxis: heparin injection 5,000 Units Start: 02/10/23 1430 SCDs Start: 02/10/23 1427  Pressure ulcer(s): not present on admission  GI prophylaxis: H2B Lines: N/A Foley:  Yes, and it is still needed Code Status:  DNR; [name] states DNR but okay with intubation. Believes would not want to be on vent long term. Last date of multidisciplinary goals of  care discussion [11/26 patient's sister [name] updated over phone.  ]  Labs   CBC: Recent Labs  Lab 02/10/23 0720 02/10/23 0730 02/11/23 0310  WBC 5.7  --  4.0  NEUTROABS 4.8  --   --   HGB 16.9 17.7* 15.1  HCT 50.5 52.0 46.9  MCV 87.7  --  89.0  PLT 195  --  147*    Basic Metabolic Panel: Recent Labs  Lab 02/10/23 0720 02/10/23 0730 02/10/23 1636 02/11/23 0310  NA 144 146*  --  142  K 3.7 3.6  --  3.3*  CL 107 108  --  111  CO2 20*  --   --  17*  GLUCOSE 75 76  --  124*  BUN 68* 67*  --  63*  CREATININE 3.72* 3.70*  --  2.52*  CALCIUM 8.1*  --   --  7.1*  MG  --   --  1.8  --    GFR: Estimated Creatinine Clearance: 24.5 mL/min (A) (by C-G formula based on SCr of 2.52 mg/dL (H)). Recent Labs  Lab 02/10/23 0720 02/10/23 0730 02/10/23 1057 02/10/23 1327 02/10/23 1636 02/10/23 1934 02/11/23 0310  WBC  5.7  --   --   --   --   --  4.0  LATICACIDVEN  --    < > 4.6* 3.5* 3.0* 2.5*  --    < > = values in this interval not displayed.    Liver Function Tests: Recent Labs  Lab 02/10/23 0720 02/11/23 0310  AST 58* 57*  ALT 32 26  ALKPHOS 204* 197*  BILITOT 1.2* 1.0  PROT 7.1 5.4*  ALBUMIN 2.7* 2.1*   No results for input(s): "LIPASE", "AMYLASE" in the last 168 hours. Recent Labs  Lab 02/10/23 0850  AMMONIA 18    ABG    Component Value Date/Time   PHART 7.36 02/10/2023 1600   PCO2ART 33 02/10/2023 1600   PO2ART 143 (H) 02/10/2023 1600   HCO3 18.6 (L) 02/10/2023 1600   TCO2 23 02/10/2023 0730   ACIDBASEDEF 5.9 (H) 02/10/2023 1600   O2SAT 99.9 02/10/2023 1600     Coagulation Profile: Recent Labs  Lab 02/10/23 0720  INR 1.4*    Cardiac Enzymes: No results for input(s): "CKTOTAL", "CKMB", "CKMBINDEX", "TROPONINI" in the last 168 hours.  HbA1C: Hemoglobin A1C  Date/Time Value Ref Range Status  06/26/2016 12:00 AM 7.6  Final  06/26/2016 12:00 AM 7.6  Final   Hgb A1c MFr Bld  Date/Time Value Ref Range Status  12/28/2022 12:55 AM 7.0 (H)  4.8 - 5.6 % Final    Comment:    (NOTE) Pre diabetes:          5.7%-6.4%  Diabetes:              >6.4%  Glycemic control for   <7.0% adults with diabetes   01/13/2022 09:25 PM 9.1 (H) 4.8 - 5.6 % Final    Comment:    (NOTE) Pre diabetes:          5.7%-6.4%  Diabetes:              >6.4%  Glycemic control for   <7.0% adults with diabetes     CBG: Recent Labs  Lab 02/10/23 1957 02/10/23 2022 02/10/23 2229 02/10/23 2347 02/11/23 0453  GLUCAP 55* 119* 122* 133* 93    Review of Systems:   Patient is encephalopathic and/or intubated; therefore, history has been obtained from chart review.    Past Medical History:  He,  has a past medical history of Acute kidney injury superimposed on chronic kidney disease (HCC) (02/10/2020), Acute on chronic kidney failure (HCC) (09/28/2019), Closed nondisplaced spiral fracture of shaft of right tibia, Community acquired pneumonia (03/24/2021), Complicated urinary tract infection (09/06/2016), Depression, Diabetes mellitus without complication (HCC), GERD (gastroesophageal reflux disease), Gout, Hemorrhoids, Hypertension, Macular degeneration, Microcytic anemia, PNA (pneumonia) (03/26/2021), Prostate cancer (HCC) (08/2009), and Tubular adenoma.   Surgical History:   Past Surgical History:  Procedure Laterality Date   CORONARY PRESSURE/FFR STUDY N/A 11/07/2021   Procedure: INTRAVASCULAR PRESSURE WIRE/FFR STUDY;  Surgeon: Orbie Pyo, MD;  Location: [facility] INVASIVE CV LAB;  Service: Cardiovascular;  Laterality: N/A;   CORONARY STENT INTERVENTION N/A 10/29/2021   Procedure: CORONARY STENT INTERVENTION;  Surgeon: [place], Peter M, MD;  Location: [facility] INVASIVE CV LAB;  Service: Cardiovascular;  Laterality: N/A;  Mid LAD   CYSTOSCOPY WITH RETROGRADE PYELOGRAM, URETEROSCOPY AND STENT PLACEMENT Right 07/31/2016   Procedure: CYSTOSCOPY WITH RIGHT  RETROGRADE PYELOGRAM, URETEROSCOPY;  Surgeon: Crist Fat, MD;  Location: [facility] ORS;  Service: Urology;   Laterality: Right;   INGUINAL HERNIA REPAIR     Left ankle joint fusion  1981   LEFT HEART CATH AND CORONARY ANGIOGRAPHY N/A 10/29/2021   Procedure: LEFT HEART CATH AND CORONARY ANGIOGRAPHY;  Surgeon: [place], Peter M, MD;  Location: [facility] INVASIVE CV LAB;  Service: Cardiovascular;  Laterality: N/A;   LEFT HEART CATH AND CORONARY ANGIOGRAPHY N/A 11/07/2021   Procedure: LEFT HEART CATH AND CORONARY ANGIOGRAPHY;  Surgeon: Orbie Pyo, MD;  Location: [facility] INVASIVE CV LAB;  Service: Cardiovascular;  Laterality: N/A;   PROSTATE BIOPSY     x 2   URETERAL REIMPLANTION  07/31/2016   Procedure: URETERAL REIMPLANT, right boari flap, right psoas hitch;  Surgeon: Crist Fat, MD;  Location: [facility] ORS;  Service: Urology;;     Social History:   reports that he has never smoked. He has never used smokeless tobacco. He reports that he does not currently use alcohol. He reports that he does not use drugs.   Family History:  His family history includes Diabetes in an other family member; Heart disease in his father; Lung cancer in his maternal uncle and mother; Pancreatic cancer in his father; Throat cancer in his brother.   Allergies Allergies  Allergen Reactions   Haldol [Haloperidol] Other (See Comments)    Per family cause life threatening complications to patient Per Pt, it causes "complications with my mouth"     Home Medications  Prior to Admission medications   Medication Sig Start Date End Date Taking? Authorizing Provider  amLODipine (NORVASC) 5 MG tablet Take 1 tablet (5 mg total) by mouth daily. 11/01/21  Yes Nooruddin, Jason Fila, MD  aspirin EC 81 MG tablet Take 1 tablet (81 mg total) by mouth daily. Swallow whole. 11/01/21  Yes Nooruddin, Jason Fila, MD  atorvastatin (LIPITOR) 40 MG tablet Take 40 mg by mouth at bedtime. 12/25/22  Yes [provider]  clopidogrel (PLAVIX) 75 MG tablet Take 75 mg by mouth daily.   Yes [provider]  Colchicine 0.6 MG CAPS Take 0.6 mg by mouth  every 12 (twelve) hours.   Yes [provider]  dapagliflozin propanediol (FARXIGA) 10 MG TABS tablet Take 1 tablet (10 mg total) by mouth daily. 11/09/21  Yes Nooruddin, Jason Fila, MD  divalproex (DEPAKOTE ER) 250 MG 24 hr tablet Take 250 mg by mouth 2 (two) times daily. 12/09/22  Yes [provider]  finasteride (PROSCAR) 5 MG tablet Take 1 tablet (5 mg total) by mouth daily. 11/01/21  Yes Nooruddin, Jason Fila, MD  insulin lispro (HUMALOG) 100 UNIT/ML KwikPen Inject 1-6 Units into the skin 3 (three) times daily before meals. Sliding scale: If 200-250 = 1 unit 251-300 = 2 units 301-350 = 3 units 351-400 = 4 units 401-450 = 5 units 451-500 = 6 units >500 administer 6 units and call MD, subcutaneously with meals 12/30/21  Yes [provider]  LANTUS SOLOSTAR 100 UNIT/ML Solostar Pen Inject 5 Units into the skin in the morning. 12/05/22  Yes [provider]  memantine (NAMENDA) 10 MG tablet Take 10 mg by mouth at bedtime.   Yes [provider]  metoprolol succinate (TOPROL-XL) 25 MG 24 hr tablet Take 1 tablet (25 mg total) by mouth daily. 10/31/21  Yes Nooruddin, Jason Fila, MD  OLANZapine (ZYPREXA) 2.5 MG tablet Take 2.5 mg by mouth 2 (two) times daily. 11/21/22  Yes [provider]  sertraline (ZOLOFT) 50 MG tablet Take 1 tablet (50 mg total) by mouth daily. 09/12/21  Yes [place], Alvira Philips, DO  tamsulosin (FLOMAX) 0.4 MG CAPS capsule Take 1 capsule (0.4 mg total) by mouth  daily after supper. 01/02/23  Yes Almon Hercules, MD     Critical care time: 40 minutes    JD Anselm Lis [employer] [facility] 02/11/2023, 7:39 AM  Please see [email or web address] for pager details.  From 7A-7P if no response, please call [phone]. After hours, please call ELink [phone].

## 2023-02-11 NOTE — Progress Notes (Signed)
Pharmacy Antibiotic Note  [name] is a 67 y.o. male admitted on 02/10/2023 with sepsis. Concern for pneumonia. Patient also with diarrhea concerning for C.diff infection. Pharmacy initially consulted for vancomycin and Zosyn dosing. Zosyn changed to cefepime today.  Plan: -Stop Zosyn and change to cefepime 2 g IV q24hr  -Increase vancomycin to 1000 mg IV q48h -Add oral vancomycin and IV Flagyl for C.diff infection with septic shock -Follow up renal function, culture results, and clinical course for dose adjustments and de-escalation as indicated   Weight: 62.6 kg (138 lb 0.1 oz)  Temp (24hrs), Avg:98.9 F (37.2 C), Min:97.7 F (36.5 C), Max:99.7 F (37.6 C)  Recent Labs  Lab 02/10/23 0720 02/10/23 0730 02/10/23 0730 02/10/23 1025 02/10/23 1057 02/10/23 1327 02/10/23 1636 02/10/23 1934 02/11/23 0310  WBC 5.7  --   --   --   --   --   --   --  4.0  CREATININE 3.72* 3.70*  --   --   --   --   --   --  2.52*  LATICACIDVEN  --  3.8*   < > 4.8* 4.6* 3.5* 3.0* 2.5*  --    < > = values in this interval not displayed.    Estimated Creatinine Clearance: 24.5 mL/min (A) (by C-G formula based on SCr of 2.52 mg/dL (H)).    Allergies  Allergen Reactions   Haldol [Haloperidol] Other (See Comments)    Per family cause life threatening complications to patient Per Pt, it causes "complications with my mouth"    Antimicrobials this admission: 11/26 PO vancomycin >> 11/25 Cefepime >> 11/25 Metronidazole >> 11/25 Vancomycin >> 11/25 Zosyn >> 11/26  Microbiology results: 11/25 BCx: ngtd 11/25 UCx: 11/25 GI panel: negative 11/25 Cdiff: Ag positive, toxin negative, PCR positive 11/25 MRSA PCR: positive  Thank you for allowing pharmacy to be a part of this patient's care.  Pricilla Riffle, PharmD, BCPS Clinical Pharmacist 02/11/2023 12:25 PM

## 2023-02-11 NOTE — Progress Notes (Signed)
Initial Nutrition Assessment  DOCUMENTATION CODES:   Non-severe (moderate) malnutrition in context of chronic illness  INTERVENTION:  - Per CCM, can start tube feeds today.  Initiate tube feeding via OGT (tip in fundus of stomach): Vital 1.2 at 60 ml/h (1440 ml per day) *Start at 40mL/hr and advance by 10mL Q12H Provides 1728 kcal, 108 gm protein, 1168 ml free water daily  - Monitor magnesium, potassium, and phosphorus BID for at least 3 days, MD to replete as needed, as pt is at risk for refeeding syndrome given malnutrition.  - FWF per CCM/MD.  - Monitor weight trends.    NUTRITION DIAGNOSIS:   Moderate Malnutrition related to chronic illness as evidenced by mild fat depletion, mild muscle depletion.  GOAL:   Patient will meet greater than or equal to 90% of their needs  MONITOR:   Vent status, Labs, Weight trends, TF tolerance  REASON FOR ASSESSMENT:   Consult Enteral/tube feeding initiation and management  ASSESSMENT:   67 yo M w/ pertinent PMH dementia, IDDM-2, HTN, HLD and mood disorder who presented with AMS.  11/25 Admit, Intubated  Patient intubated and sedated at time of visit. OGT in fundus of stomach per xray.  Per chart review, weight history over the past year as below: 02/17/22: 167# 02/28/22: 167# 12/14/22: 199# 10/11: 199# 11/25: 137#  Weight trends indicate possible weight loss during the past year however given severity of differences in weights over the past 2 months question accuracy of weights. No family at bedside to confirm weight history.  Per CCM, can start tube feeds today and beign advancing towards goal. Planning for slow advancement as dicussed with CCM PA. Patient may be at risk for refeeding syndrome, will need to monitor electrolytes closely.    Medications reviewed and include: Colace, Q4H FWF, Miralax D10 @ 9mL/hr (provides 81 kcals over 24 hours) Levophed @ 58mcg/min  Labs reviewed:  K+ 3.3 Creatinine 2.52 HA1C  7.0 Blood Glucose 55-133 x24 hours   NUTRITION - FOCUSED PHYSICAL EXAM:  Flowsheet Row Most Recent Value  Orbital Region Mild depletion  Upper Arm Region Mild depletion  Thoracic and Lumbar Region No depletion  Buccal Region Unable to assess  Temple Region Moderate depletion  Clavicle Bone Region Mild depletion  Clavicle and Acromion Bone Region No depletion  Scapular Bone Region Unable to assess  Dorsal Hand No depletion  Patellar Region Mild depletion  Anterior Thigh Region Mild depletion  Posterior Calf Region No depletion  Edema (RD Assessment) None  Hair Reviewed  Eyes Reviewed  Mouth Reviewed  Skin Reviewed  Nails Reviewed       Diet Order:   Diet Order             Diet NPO time specified Except for: Other (See Comments)  Diet effective now                   EDUCATION NEEDS:  Not appropriate for education at this time  Skin:  Skin Assessment: Skin Integrity Issues: Skin Integrity Issues:: Stage II, DTI DTI: Medial Sacrum Stage II: Right and Legt buttocks  Last BM:  11/26 - rectal tube  Height:  Ht Readings from Last 1 Encounters:  12/27/22 5\' 6"  (1.676 m)   Weight:  Wt Readings from Last 1 Encounters:  02/11/23 62.6 kg    BMI:  Body mass index is 22.28 kg/m.  Estimated Nutritional Needs:  Kcal:  1600-1900 kcals Protein:  95-110 grams Fluid:  >/= 1.6L  Shelle Iron RD, LDN For contact information, refer to [facility].

## 2023-02-11 NOTE — Plan of Care (Signed)
  Problem: Coping: Goal: Level of anxiety will decrease Outcome: Progressing   Problem: Pain Management: Goal: General experience of comfort will improve Outcome: Progressing   Problem: Safety: Goal: Ability to remain free from injury will improve Outcome: Progressing   Problem: Clinical Measurements: Goal: Will remain free from infection Outcome: Not Progressing Goal: Respiratory complications will improve Outcome: Not Progressing Goal: Cardiovascular complication will be avoided Outcome: Not Progressing   Problem: Activity: Goal: Risk for activity intolerance will decrease Outcome: Not Progressing   Problem: Nutrition: Goal: Adequate nutrition will be maintained Outcome: Not Progressing

## 2023-02-11 NOTE — Progress Notes (Signed)
Hypoglycemic Event  CBG: 56   Treatment: D50 25 mL (12.5 gm)  Symptoms: None  Follow-up CBG: Time:1318  CBG Result:100  Possible Reasons for Event: Inadequate meal intake and Medication regimen:    Comments/MD notified: Suzie Portela, PA notified-- D50 given and D10 gtt ordered to be restarted.     [place] Vela Prose

## 2023-02-12 ENCOUNTER — Other Ambulatory Visit: Payer: Self-pay

## 2023-02-12 ENCOUNTER — Inpatient Hospital Stay (HOSPITAL_COMMUNITY): Payer: 59

## 2023-02-12 DIAGNOSIS — J9601 Acute respiratory failure with hypoxia: Secondary | ICD-10-CM | POA: Diagnosis not present

## 2023-02-12 DIAGNOSIS — G9340 Encephalopathy, unspecified: Secondary | ICD-10-CM

## 2023-02-12 DIAGNOSIS — A419 Sepsis, unspecified organism: Secondary | ICD-10-CM | POA: Diagnosis not present

## 2023-02-12 DIAGNOSIS — L8990 Pressure ulcer of unspecified site, unspecified stage: Secondary | ICD-10-CM | POA: Insufficient documentation

## 2023-02-12 DIAGNOSIS — R6521 Severe sepsis with septic shock: Secondary | ICD-10-CM | POA: Diagnosis not present

## 2023-02-12 LAB — COMPREHENSIVE METABOLIC PANEL
ALT: 21 U/L (ref 0–44)
AST: 39 U/L (ref 15–41)
Albumin: 1.7 g/dL — ABNORMAL LOW (ref 3.5–5.0)
Alkaline Phosphatase: 183 U/L — ABNORMAL HIGH (ref 38–126)
Anion gap: 13 (ref 5–15)
BUN: 61 mg/dL — ABNORMAL HIGH (ref 8–23)
CO2: 17 mmol/L — ABNORMAL LOW (ref 22–32)
Calcium: 7.1 mg/dL — ABNORMAL LOW (ref 8.9–10.3)
Chloride: 113 mmol/L — ABNORMAL HIGH (ref 98–111)
Creatinine, Ser: 1.56 mg/dL — ABNORMAL HIGH (ref 0.61–1.24)
GFR, Estimated: 48 mL/min — ABNORMAL LOW (ref >60)
Glucose, Bld: 152 mg/dL — ABNORMAL HIGH (ref 70–99)
Potassium: 3.5 mmol/L (ref 3.5–5.1)
Sodium: 143 mmol/L (ref 135–145)
Total Bilirubin: 0.8 mg/dL (ref <1.2)
Total Protein: 5.1 g/dL — ABNORMAL LOW (ref 6.5–8.1)

## 2023-02-12 LAB — PHOSPHORUS
Phosphorus: 2.1 mg/dL — ABNORMAL LOW (ref 2.5–4.6)
Phosphorus: 2.3 mg/dL — ABNORMAL LOW (ref 2.5–4.6)

## 2023-02-12 LAB — GLUCOSE, CAPILLARY
Glucose-Capillary: 136 mg/dL — ABNORMAL HIGH (ref 70–99)
Glucose-Capillary: 139 mg/dL — ABNORMAL HIGH (ref 70–99)
Glucose-Capillary: 145 mg/dL — ABNORMAL HIGH (ref 70–99)
Glucose-Capillary: 146 mg/dL — ABNORMAL HIGH (ref 70–99)
Glucose-Capillary: 156 mg/dL — ABNORMAL HIGH (ref 70–99)
Glucose-Capillary: 166 mg/dL — ABNORMAL HIGH (ref 70–99)

## 2023-02-12 LAB — MAGNESIUM
Magnesium: 2.1 mg/dL (ref 1.7–2.4)
Magnesium: 2.1 mg/dL (ref 1.7–2.4)

## 2023-02-12 LAB — CBC
HCT: 42.2 % (ref 39.0–52.0)
Hemoglobin: 13.4 g/dL (ref 13.0–17.0)
MCH: 28.7 pg (ref 26.0–34.0)
MCHC: 31.8 g/dL (ref 30.0–36.0)
MCV: 90.4 fL (ref 80.0–100.0)
Platelets: UNDETERMINED 10*3/uL (ref 150–400)
RBC: 4.67 MIL/uL (ref 4.22–5.81)
RDW: 19.5 % — ABNORMAL HIGH (ref 11.5–15.5)
WBC: 1.6 10*3/uL — ABNORMAL LOW (ref 4.0–10.5)
nRBC: 0 % (ref 0.0–0.2)

## 2023-02-12 MED ORDER — CHLORHEXIDINE GLUCONATE CLOTH 2 % EX PADS: 6.0000 | MEDICATED_PAD | Freq: Every day | CUTANEOUS | Status: DC

## 2023-02-12 MED ORDER — POTASSIUM PHOSPHATES 15 MMOLE/5ML IV SOLN
15.0000 mmol | Freq: Once | INTRAVENOUS | Status: AC
  Administered 2023-02-12: 15 mmol via INTRAVENOUS
  Filled 2023-02-12: qty 5

## 2023-02-12 MED ORDER — PHENYLEPHRINE 80 MCG/ML (10ML) SYRINGE FOR IV PUSH (FOR BLOOD PRESSURE SUPPORT)
PREFILLED_SYRINGE | INTRAVENOUS | Status: AC
  Filled 2023-02-12: qty 10

## 2023-02-12 MED ORDER — VANCOMYCIN HCL 750 MG/150ML IV SOLN
750.0000 mg | INTRAVENOUS | Status: DC
  Filled 2023-02-12: qty 150

## 2023-02-12 MED ORDER — FREE WATER
200.0000 mL | Status: DC
  Administered 2023-02-12 – 2023-02-19 (×39): 200 mL

## 2023-02-12 MED ORDER — MUPIROCIN 2 % EX OINT
1.0000 | TOPICAL_OINTMENT | Freq: Two times a day (BID) | CUTANEOUS | Status: AC
  Administered 2023-02-12 – 2023-02-16 (×10): 1 via NASAL
  Filled 2023-02-12: qty 22

## 2023-02-12 MED ORDER — SODIUM CHLORIDE 0.9 % IV SOLN
2.0000 g | Freq: Two times a day (BID) | INTRAVENOUS | Status: AC
  Administered 2023-02-12 – 2023-02-16 (×10): 2 g via INTRAVENOUS
  Filled 2023-02-12 (×10): qty 12.5

## 2023-02-12 NOTE — Progress Notes (Signed)
NAME:  [name], MRN:  [id number], DOB:  31-Mar-1955, LOS: 2 ADMISSION DATE:  02/10/2023, CONSULTATION DATE:  11/25 REFERRING MD:  Dr. Rhunette Croft, CHIEF COMPLAINT:  acute respiratory failure w/ hypoxia; encephalopathy   History of Present Illness:  Patient is a 67 yo M w/ pertinent PMH dementia, IDDM-2, HTN, HLD and mood disorder presents to [facility] ED on 11/25 w/ ams.  Patient recently admitted last month w/ confusion/lethargy. Found to have severe sepiss from staph aureus uti. Discharged back to SNF. On 11/25 patient w/ AMS. On ems arrival patient sob and unresponsive. Sats 84% and started on supplemental O2. Transported to [facility] ED.  On arrival to [facility] ED on 11/25, patient remains altered. Febrile 100.5 and tachycardic 120s. BP stable. LA 3.8 and given IV fluids. Placed on NRB w/ sats low 90s. CBG 51 given dextrose. Cultures obtained and started on broad spectrum abx. Vbg 7.38, 41, 31, 24. CXR and CT head w/ no significant findings. Patient having diarrhea while in ED. CT abd/pelvis showing dense LLL consolidation and patchy opacities in Right lung base suspicious for pna; mild wall thickening of descending colon/sigmoid colon possible colitis; stable chronic right sided hydronephrosis. Covid/flu negative. UA w/ large leukocytes and few bacteria. Cdif antigen showing positive antigen and negative toxin; PCR pending. Patient remained encephalopathic and BP remains soft despite IV fluids. On HHFNC 80% fio2. ED plans to intubate given increase in o2 needs and poor mental status. PCCM consulted for icu admission.  Pertinent ED labs: creat 3.72, ast 58, alk phosph 204, AG 17, ammonia 18   Pertinent  Medical History   Past Medical History:  Diagnosis Date   Acute kidney injury superimposed on chronic kidney disease (HCC) 02/10/2020   Acute on chronic kidney failure (HCC) 09/28/2019   Closed nondisplaced spiral fracture of shaft of right tibia    Community acquired pneumonia 03/24/2021   Complicated  urinary tract infection 09/06/2016   Depression    Diabetes mellitus without complication (HCC)    GERD (gastroesophageal reflux disease)    Gout    right foot   Hemorrhoids    Hypertension    Macular degeneration    Microcytic anemia    PNA (pneumonia) 03/26/2021   Prostate cancer (HCC) 08/2009   Tubular adenoma      Significant Hospital Events: Including procedures, antibiotic start and stop dates in addition to other pertinent events   11/25 admitted w/ ams and respiratory failure w/ hypoxia  Interim History / Subjective:  EEG performed yesterday; results pending off sedation; opens eyes to voice; perrl; some extremity mvt w/ painful stimuli; cough/gag present On 4 mcg levo  Objective   Blood pressure 114/88, pulse (!) 109, temperature 98.2 F (36.8 C), resp. rate 20, weight 65.3 kg, SpO2 99%.    Vent Mode: PRVC FiO2 (%):  [35 %-40 %] 40 % Set Rate:  [20 bmp] 20 bmp Vt Set:  [510 mL] 510 mL PEEP:  [5 cmH20] 5 cmH20 Pressure Support:  [10 cmH20] 10 cmH20 Plateau Pressure:  [17 cmH20] 17 cmH20   Intake/Output Summary (Last 24 hours) at 02/12/2023 0739 Last data filed at 02/12/2023 6433 Gross per 24 hour  Intake 2382.72 ml  Output 906 ml  Net 1476.72 ml   Filed Weights   02/10/23 1400 02/11/23 0500 02/12/23 0500  Weight: 62.1 kg 62.6 kg 65.3 kg    Examination: General:  critically ill appearing on mech vent HEENT: MM pink/moist; ETT in place Neuro: off sedation; opens eyes to voice;  perrl; some extremity mvt w/ painful stimuli; cough/gag present CV: s1s2, tachy 120s, no m/r/g PULM:  dim clear BS bilaterally; on mech vent PRVC GI: soft, bsx4 active  Extremities: warm/dry, no edema  Skin: no rashes or lesions    Resolved Hospital Problem list     Assessment & Plan:    Acute respiratory failure w/ hypoxia LLL>RLL opacity; possible pna Plan: -PSV trials as tolerated; not candidate for extubation with poor mental status -LTVV strategy with tidal  volumes of 6-8 cc/kg ideal body weight -Wean PEEP/FiO2 for SpO2 >92% -VAP bundle in place -Daily SAT and SBT -PAD protocol in place -wean sedation for RASS goal 0 to -1 -cont cefepime, vanc, azithro, flagyl -follow cultures  Septic shock -likely 2/2 to cdiff colitis, pna, and uti -strep pneumonia negative; legionella pending Plan: -levo for map goal >65  -appears dehydrated; increasing free water -cont vanc, cefepime, azithro, flagyl -follow cultures  Acute metabolic encephalopathy: likely secondary to sepsis as above; also hypoxic on  EMS arrival -ct head negative, ammonia and tsh wnl -per family at baseline patient interactive and believe he may be wheelchair bound Plan: -limit sedating meds -treat sepsis as above -EEG results pending -consider MRI  Hypoglycemia IDDM Plan: -cont to hold ssi -cbg slowly improving; will stop d10 drip -cont TF per RD -cbg monitoring  AKI Hypokalemia Hypophosphatemia Plan: -replete K/phosph -increase free water -Trend BMP / urinary output -Replace electrolytes as indicated -Avoid nephrotoxic agents, ensure adequate renal perfusion  Hx of urinary retention/chronic hydronephrosis with reimplantation of right ureter Plan: -cont foley -monitor UOP -hold finasteride   LFT elevation  Plan: -elevated on last admission likely related to sepis; RUQ [place] negative on last admission -improving -trend CMP   Essential HTN HLD CAD Plan: -cont home asa and plavix -hold statin w/ elevated LFTs -hold anti-hypertensives   Mood disorder Dementia Plan: -hold home depakote, sertraline, and zyprexa for now -resume namenda  Best Practice (right click and "Reselect all SmartList Selections" daily)   Diet/type: tubefeeds DVT prophylaxis: heparin injection 5,000 Units Start: 02/10/23 1430 SCDs Start: 02/10/23 1427  Pressure ulcer(s): not present on admission  GI prophylaxis: H2B Lines: N/A Foley:  Yes, and it is still needed Code  Status:  DNR; Tonia states DNR but okay with intubation. Believes would not want to be on vent long term. Last date of multidisciplinary goals of care discussion [11/26 patient's sister [name] updated over phone.  ]  Labs   CBC: Recent Labs  Lab 02/10/23 0720 02/10/23 0730 02/11/23 0310 02/12/23 0344  WBC 5.7  --  4.0 1.6*  NEUTROABS 4.8  --   --   --   HGB 16.9 17.7* 15.1 13.4  HCT 50.5 52.0 46.9 42.2  MCV 87.7  --  89.0 90.4  PLT 195  --  147* PLATELET CLUMPS NOTED ON SMEAR, UNABLE TO ESTIMATE    Basic Metabolic Panel: Recent Labs  Lab 02/10/23 0720 02/10/23 0730 02/10/23 1636 02/11/23 0310 02/11/23 0905 02/11/23 2045 02/12/23 0344  NA 144 146*  --  142  --   --  143  K 3.7 3.6  --  3.3*  --   --  3.5  CL 107 108  --  111  --   --  113*  CO2 20*  --   --  17*  --   --  17*  GLUCOSE 75 76  --  124*  --   --  152*  BUN 68* 67*  --  63*  --   --  61*  CREATININE 3.72* 3.70*  --  2.52*  --   --  1.56*  CALCIUM 8.1*  --   --  7.1*  --   --  7.1*  MG  --   --  1.8  --  1.7 2.1 2.1  PHOS  --   --   --   --  3.4 2.6 2.1*   GFR: Estimated Creatinine Clearance: 40.3 mL/min (A) (by C-G formula based on SCr of 1.56 mg/dL (H)). Recent Labs  Lab 02/10/23 0720 02/10/23 0730 02/10/23 1057 02/10/23 1327 02/10/23 1636 02/10/23 1934 02/11/23 0310 02/12/23 0344  WBC 5.7  --   --   --   --   --  4.0 1.6*  LATICACIDVEN  --    < > 4.6* 3.5* 3.0* 2.5*  --   --    < > = values in this interval not displayed.    Liver Function Tests: Recent Labs  Lab 02/10/23 0720 02/11/23 0310 02/12/23 0344  AST 58* 57* 39  ALT 32 26 21  ALKPHOS 204* 197* 183*  BILITOT 1.2* 1.0 0.8  PROT 7.1 5.4* 5.1*  ALBUMIN 2.7* 2.1* 1.7*   No results for input(s): "LIPASE", "AMYLASE" in the last 168 hours. Recent Labs  Lab 02/10/23 0850  AMMONIA 18    ABG    Component Value Date/Time   PHART 7.36 02/10/2023 1600   PCO2ART 33 02/10/2023 1600   PO2ART 143 (H) 02/10/2023 1600   HCO3 18.6  (L) 02/10/2023 1600   TCO2 23 02/10/2023 0730   ACIDBASEDEF 5.9 (H) 02/10/2023 1600   O2SAT 99.9 02/10/2023 1600     Coagulation Profile: Recent Labs  Lab 02/10/23 0720  INR 1.4*    Cardiac Enzymes: No results for input(s): "CKTOTAL", "CKMB", "CKMBINDEX", "TROPONINI" in the last 168 hours.  HbA1C: Hemoglobin A1C  Date/Time Value Ref Range Status  06/26/2016 12:00 AM 7.6  Final  06/26/2016 12:00 AM 7.6  Final   Hgb A1c MFr Bld  Date/Time Value Ref Range Status  12/28/2022 12:55 AM 7.0 (H) 4.8 - 5.6 % Final    Comment:    (NOTE) Pre diabetes:          5.7%-6.4%  Diabetes:              >6.4%  Glycemic control for   <7.0% adults with diabetes   01/13/2022 09:25 PM 9.1 (H) 4.8 - 5.6 % Final    Comment:    (NOTE) Pre diabetes:          5.7%-6.4%  Diabetes:              >6.4%  Glycemic control for   <7.0% adults with diabetes     CBG: Recent Labs  Lab 02/11/23 1733 02/11/23 2005 02/11/23 2319 02/12/23 0343 02/12/23 0730  GLUCAP 117* 106* 122* 146* 156*    Review of Systems:   Patient is encephalopathic and/or intubated; therefore, history has been obtained from chart review.    Past Medical History:  He,  has a past medical history of Acute kidney injury superimposed on chronic kidney disease (HCC) (02/10/2020), Acute on chronic kidney failure (HCC) (09/28/2019), Closed nondisplaced spiral fracture of shaft of right tibia, Community acquired pneumonia (03/24/2021), Complicated urinary tract infection (09/06/2016), Depression, Diabetes mellitus without complication (HCC), GERD (gastroesophageal reflux disease), Gout, Hemorrhoids, Hypertension, Macular degeneration, Microcytic anemia, PNA (pneumonia) (03/26/2021), Prostate cancer (HCC) (08/2009), and Tubular adenoma.   Surgical History:   Past Surgical History:  Procedure Laterality Date  CORONARY PRESSURE/FFR STUDY N/A 11/07/2021   Procedure: INTRAVASCULAR PRESSURE WIRE/FFR STUDY;  Surgeon: Orbie Pyo, MD;  Location: [facility] INVASIVE CV LAB;  Service: Cardiovascular;  Laterality: N/A;   CORONARY STENT INTERVENTION N/A 10/29/2021   Procedure: CORONARY STENT INTERVENTION;  Surgeon: [place], Peter M, MD;  Location: [facility] INVASIVE CV LAB;  Service: Cardiovascular;  Laterality: N/A;  Mid LAD   CYSTOSCOPY WITH RETROGRADE PYELOGRAM, URETEROSCOPY AND STENT PLACEMENT Right 07/31/2016   Procedure: CYSTOSCOPY WITH RIGHT  RETROGRADE PYELOGRAM, URETEROSCOPY;  Surgeon: Crist Fat, MD;  Location: [facility] ORS;  Service: Urology;  Laterality: Right;   INGUINAL HERNIA REPAIR     Left ankle joint fusion  1981   LEFT HEART CATH AND CORONARY ANGIOGRAPHY N/A 10/29/2021   Procedure: LEFT HEART CATH AND CORONARY ANGIOGRAPHY;  Surgeon: [place], Peter M, MD;  Location: [facility] INVASIVE CV LAB;  Service: Cardiovascular;  Laterality: N/A;   LEFT HEART CATH AND CORONARY ANGIOGRAPHY N/A 11/07/2021   Procedure: LEFT HEART CATH AND CORONARY ANGIOGRAPHY;  Surgeon: Orbie Pyo, MD;  Location: [facility] INVASIVE CV LAB;  Service: Cardiovascular;  Laterality: N/A;   PROSTATE BIOPSY     x 2   URETERAL REIMPLANTION  07/31/2016   Procedure: URETERAL REIMPLANT, right boari flap, right psoas hitch;  Surgeon: Crist Fat, MD;  Location: [facility] ORS;  Service: Urology;;     Social History:   reports that he has never smoked. He has never used smokeless tobacco. He reports that he does not currently use alcohol. He reports that he does not use drugs.   Family History:  His family history includes Diabetes in an other family member; Heart disease in his father; Lung cancer in his maternal uncle and mother; Pancreatic cancer in his father; Throat cancer in his brother.   Allergies Allergies  Allergen Reactions   Haldol [Haloperidol] Other (See Comments)    Per family cause life threatening complications to patient Per Pt, it causes "complications with my mouth"     Home Medications  Prior to Admission medications   Medication Sig Start  Date End Date Taking? Authorizing Provider  amLODipine (NORVASC) 5 MG tablet Take 1 tablet (5 mg total) by mouth daily. 11/01/21  Yes Nooruddin, Jason Fila, MD  aspirin EC 81 MG tablet Take 1 tablet (81 mg total) by mouth daily. Swallow whole. 11/01/21  Yes Nooruddin, Jason Fila, MD  atorvastatin (LIPITOR) 40 MG tablet Take 40 mg by mouth at bedtime. 12/25/22  Yes [provider]  clopidogrel (PLAVIX) 75 MG tablet Take 75 mg by mouth daily.   Yes [provider]  Colchicine 0.6 MG CAPS Take 0.6 mg by mouth every 12 (twelve) hours.   Yes [provider]  dapagliflozin propanediol (FARXIGA) 10 MG TABS tablet Take 1 tablet (10 mg total) by mouth daily. 11/09/21  Yes Nooruddin, Jason Fila, MD  divalproex (DEPAKOTE ER) 250 MG 24 hr tablet Take 250 mg by mouth 2 (two) times daily. 12/09/22  Yes [provider]  finasteride (PROSCAR) 5 MG tablet Take 1 tablet (5 mg total) by mouth daily. 11/01/21  Yes Nooruddin, Jason Fila, MD  insulin lispro (HUMALOG) 100 UNIT/ML KwikPen Inject 1-6 Units into the skin 3 (three) times daily before meals. Sliding scale: If 200-250 = 1 unit 251-300 = 2 units 301-350 = 3 units 351-400 = 4 units 401-450 = 5 units 451-500 = 6 units >500 administer 6 units and call MD, subcutaneously with meals 12/30/21  Yes [provider]  LANTUS SOLOSTAR 100 UNIT/ML Solostar  Pen Inject 5 Units into the skin in the morning. 12/05/22  Yes [provider]  memantine (NAMENDA) 10 MG tablet Take 10 mg by mouth at bedtime.   Yes [provider]  metoprolol succinate (TOPROL-XL) 25 MG 24 hr tablet Take 1 tablet (25 mg total) by mouth daily. 10/31/21  Yes Nooruddin, Jason Fila, MD  OLANZapine (ZYPREXA) 2.5 MG tablet Take 2.5 mg by mouth 2 (two) times daily. 11/21/22  Yes [provider]  sertraline (ZOLOFT) 50 MG tablet Take 1 tablet (50 mg total) by mouth daily. 09/12/21  Yes [place], Alvira Philips, DO  tamsulosin (FLOMAX) 0.4 MG CAPS capsule Take 1 capsule (0.4 mg  total) by mouth daily after supper. 01/02/23  Yes Almon Hercules, MD     Critical care time: 35 minutes    JD Anselm Lis [facility] 02/12/2023, 7:39 AM  Please see [email or web address] for pager details.  From 7A-7P if no response, please call [phone]. After hours, please call ELink [phone].

## 2023-02-12 NOTE — TOC Initial Note (Signed)
Transition of Care [facility]) - Initial/Assessment Note    Patient Details  Name: [name] MRN: [id number] Date of Birth: September 15, 1955  Transition of Care [facility]) CM/SW Contact:    Amada Jupiter, LCSW Phone Number: 02/12/2023, 2:18 PM  Clinical Narrative:                  Have spoken with pt's sister, [name], to review anticipated dc needs. She confirms that pt is admitted from [facility] - there since Aug 2023 and have confirmed with facility he is in a LTC Medicaid bed. Once pt is medically cleared, plan is for him to return to [facility].  At this point, TOC will follow along and assist with this plan as pt medically clears.  Expected Discharge Plan: Long Term Nursing Home Barriers to Discharge: Continued Medical Work up   Patient Goals and CMS Choice Patient states their goals for this hospitalization and ongoing recovery are:: family goal for pt to return to LTC bed          Expected Discharge Plan and Services In-house Referral: Clinical Social Work   Post Acute Care Choice: Nursing Home Living arrangements for the past 2 months:  (LTC bed at [facility])                 DME Arranged: N/A DME Agency: NA                  Prior Living Arrangements/Services Living arrangements for the past 2 months:  (LTC bed at [facility]) Lives with:: Facility Resident Patient language and need for interpreter reviewed:: Yes Do you feel safe going back to the place where you live?: Yes      Need for Family Participation in Patient Care: No (Comment) Care giver support system in place?: Yes (comment)   Criminal Activity/Legal Involvement Pertinent to Current Situation/Hospitalization: No - Comment as needed  Activities of Daily Living   ADL Screening (condition at time of admission) Independently performs ADLs?: No Does the patient have a NEW difficulty with bathing/dressing/toileting/self-feeding that is expected to last >3 days?: No Does the patient have a NEW  difficulty with getting in/out of bed, walking, or climbing stairs that is expected to last >3 days?: No Does the patient have a NEW difficulty with communication that is expected to last >3 days?: No Is the patient deaf or have difficulty hearing?: Yes Does the patient have difficulty seeing, even when wearing glasses/contacts?: No Does the patient have difficulty concentrating, remembering, or making decisions?: Yes  Permission Sought/Granted Permission sought to share information with : Family Supports    Share Information with NAME: sister, Davonna Belling @ [phone]           Emotional Assessment Appearance:: Appears stated age Attitude/Demeanor/Rapport: Unable to Assess, Intubated (Following Commands or Not Following Commands) Affect (typically observed): Unable to Assess   Alcohol / Substance Use: Not Applicable Psych Involvement: No (comment)  Admission diagnosis:  Acute respiratory failure with hypoxia (HCC) [J96.01] Patient Active Problem List   Diagnosis Date Noted   Acute encephalopathy 02/12/2023   Pressure injury of skin 02/12/2023   Acute respiratory failure with hypoxia (HCC) 02/10/2023   Encephalopathy acute 02/10/2023   AKI (acute kidney injury) (HCC) 02/10/2023   Severe sepsis w/ septic shock POA evidenced by encephalopathy, significant lactic acid elevation 12/27/2022   Sepsis (HCC) 12/27/2022   Acute subdural hematoma (HCC) 01/13/2022   CAD S/P percutaneous coronary angioplasty 01/13/2022   Left elbow pain  01/13/2022   NSTEMI (non-ST elevated myocardial infarction) (HCC) 10/29/2021   History of prostate cancer with recurrent urinary obstruction  03/24/2021   HLD (hyperlipidemia) 01/05/2021   Acute urinary retention 10/25/2020   Adjustment disorder with mixed anxiety and depressed mood    Ataxia 08/01/2020   Hydroureteronephrosis 02/10/2020   Sinus tachycardia 09/28/2019   Insomnia 08/26/2018   Nephropathy 03/26/2018   Urinary urgency 03/26/2018   Type  2 diabetes mellitus with diabetic nephropathy, without long-term current use of insulin (HCC) 03/26/2018   Essential hypertension 03/26/2018   Chronic kidney disease (CKD), stage III (moderate) (HCC) 03/26/2018   Conductive hearing loss, bilateral 11/07/2017   Microcytic anemia 02/17/2012   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 [place] [address] Phone: [phone] Fax: [phone]     Social Determinants of Health (SDOH) Social History: SDOH Screenings   Food Insecurity: Patient Unable To Answer (02/10/2023)  Housing: Patient Unable To Answer (02/10/2023)  Transportation Needs: Patient Unable To Answer (02/10/2023)  Utilities: Patient Unable To Answer (02/10/2023)  Depression (PHQ2-9): High Risk (12/06/2020)  Financial Resource Strain: Low Risk  (07/24/2021)   Received from [facility], [facility]  Physical Activity: Insufficiently Active (08/26/2019)  Social Connections: Unknown (07/24/2021)   Received from [facility], [facility]  Stress: Stress Concern Present (07/24/2021)   Received from [facility], [facility]  Tobacco Use: Low Risk  (02/10/2023)   SDOH Interventions:     Readmission Risk Interventions    10/31/2021    2:47 PM  Readmission Risk Prevention Plan  Transportation Screening Complete  Medication Review (RN Care Manager) Complete  PCP or Specialist appointment within 3-5 days of discharge Complete  SW Recovery Care/Counseling Consult Complete  Palliative Care Screening Not Applicable  Skilled Nursing Facility Complete

## 2023-02-12 NOTE — Progress Notes (Signed)
An USGPIV (ultrasound guided PIV) has been placed for short-term vasopressor infusion. A correctly placed ivWatch must be used when administering vasopressors. Should this treatment be needed beyond 72 hours, central line access should be obtained.  It will be the responsibility of the bedside nurse to follow best practice to prevent extravasations.   

## 2023-02-12 NOTE — Plan of Care (Signed)
Problem: Clinical Measurements: Goal: Diagnostic test results will improve Outcome: Not Progressing Goal: Respiratory complications will improve Outcome: Not Progressing   Problem: Nutrition: Goal: Adequate nutrition will be maintained Outcome: Not Progressing

## 2023-02-12 NOTE — Procedures (Signed)
Patient Name: [name]  MRN: [id number]  Epilepsy Attending: Charlsie Quest  Referring Physician/Provider: Lidia Collum, PA-C  Date: 02/11/2023 Duration: 24.37 mins  Patient history: 67 yo M with ams getting eeg to evaluate for seizure  Level of alertness:comatose  AEDs during EEG study: None  Technical aspects: This EEG study was done with scalp electrodes positioned according to the 10-20 International system of electrode placement. Electrical activity was reviewed with band pass filter of 1-70Hz , sensitivity of 7 uV/mm, display speed of 57mm/sec with a 60Hz  notched filter applied as appropriate. EEG data were recorded continuously and digitally stored.  Video monitoring was available and reviewed as appropriate.  Description: EEG showed continuous generalized 3 to 6 Hz theta-delta slowing. Hyperventilation and photic stimulation were not performed.     ABNORMALITY - Continuous slow, generalized  IMPRESSION: This study is suggestive of moderate to severe diffuse encephalopathy. No seizures or epileptiform discharges were seen throughout the recording.  Shaunte Weissinger Annabelle Harman

## 2023-02-12 NOTE — Progress Notes (Signed)
PHARMACY NOTE:  ANTIMICROBIAL RENAL DOSAGE ADJUSTMENT  Current antimicrobial regimen includes a mismatch between antimicrobial dosage and estimated renal function.  As per policy approved by the Pharmacy & Therapeutics and Medical Executive Committees, the antimicrobial dosage will be adjusted accordingly.  Current antimicrobial dosage: vancomycin 1000 mg q48h  Indication: sepsis  Renal Function:  Estimated Creatinine Clearance (by C-G formula based on SCr of 1.56 mg/dL (H)) Male: 40.9 mL/min (A) Male: 40.3 mL/min (A)     Antimicrobial dosage has been changed to: vancomycin 750 mg q24h    Thank you for allowing pharmacy to be a part of this patient's care.  Pricilla Riffle, PharmD, BCPS Clinical Pharmacist 02/12/2023 1:55 PM

## 2023-02-13 DIAGNOSIS — A419 Sepsis, unspecified organism: Secondary | ICD-10-CM | POA: Diagnosis not present

## 2023-02-13 DIAGNOSIS — E440 Moderate protein-calorie malnutrition: Secondary | ICD-10-CM | POA: Insufficient documentation

## 2023-02-13 DIAGNOSIS — R6521 Severe sepsis with septic shock: Secondary | ICD-10-CM | POA: Diagnosis not present

## 2023-02-13 DIAGNOSIS — J9601 Acute respiratory failure with hypoxia: Secondary | ICD-10-CM | POA: Diagnosis not present

## 2023-02-13 LAB — LEGIONELLA PNEUMOPHILA SEROGP 1 UR AG: L. pneumophila Serogp 1 Ur Ag: NEGATIVE

## 2023-02-13 LAB — GLUCOSE, CAPILLARY
Glucose-Capillary: 117 mg/dL — ABNORMAL HIGH (ref 70–99)
Glucose-Capillary: 127 mg/dL — ABNORMAL HIGH (ref 70–99)
Glucose-Capillary: 132 mg/dL — ABNORMAL HIGH (ref 70–99)
Glucose-Capillary: 144 mg/dL — ABNORMAL HIGH (ref 70–99)
Glucose-Capillary: 188 mg/dL — ABNORMAL HIGH (ref 70–99)
Glucose-Capillary: 197 mg/dL — ABNORMAL HIGH (ref 70–99)

## 2023-02-13 LAB — COMPREHENSIVE METABOLIC PANEL
ALT: 20 U/L (ref 0–44)
AST: 27 U/L (ref 15–41)
Albumin: 1.8 g/dL — ABNORMAL LOW (ref 3.5–5.0)
Alkaline Phosphatase: 337 U/L — ABNORMAL HIGH (ref 38–126)
Anion gap: 10 (ref 5–15)
BUN: 57 mg/dL — ABNORMAL HIGH (ref 8–23)
CO2: 19 mmol/L — ABNORMAL LOW (ref 22–32)
Calcium: 7.2 mg/dL — ABNORMAL LOW (ref 8.9–10.3)
Chloride: 111 mmol/L (ref 98–111)
Creatinine, Ser: 1.18 mg/dL (ref 0.61–1.24)
GFR, Estimated: >60 mL/min (ref >60)
Glucose, Bld: 165 mg/dL — ABNORMAL HIGH (ref 70–99)
Potassium: 3.1 mmol/L — ABNORMAL LOW (ref 3.5–5.1)
Sodium: 140 mmol/L (ref 135–145)
Total Bilirubin: 1 mg/dL (ref <1.2)
Total Protein: 5.1 g/dL — ABNORMAL LOW (ref 6.5–8.1)

## 2023-02-13 LAB — CBC
HCT: 40.6 % (ref 39.0–52.0)
Hemoglobin: 13.3 g/dL (ref 13.0–17.0)
MCH: 28.6 pg (ref 26.0–34.0)
MCHC: 32.8 g/dL (ref 30.0–36.0)
MCV: 87.3 fL (ref 80.0–100.0)
Platelets: 47 10*3/uL — ABNORMAL LOW (ref 150–400)
RBC: 4.65 MIL/uL (ref 4.22–5.81)
RDW: 19.5 % — ABNORMAL HIGH (ref 11.5–15.5)
WBC: 1.6 10*3/uL — ABNORMAL LOW (ref 4.0–10.5)
nRBC: 0 % (ref 0.0–0.2)

## 2023-02-13 MED ORDER — POTASSIUM CHLORIDE 10 MEQ/100ML IV SOLN
10.0000 meq | INTRAVENOUS | Status: AC
Start: 2023-02-13 — End: 2023-02-13
  Administered 2023-02-13 (×2): 10 meq via INTRAVENOUS
  Filled 2023-02-13 (×2): qty 100

## 2023-02-13 MED ORDER — POTASSIUM CHLORIDE 20 MEQ PO PACK
20.0000 meq | PACK | Freq: Once | ORAL | Status: AC
  Administered 2023-02-13: 20 meq
  Filled 2023-02-13: qty 1

## 2023-02-13 MED ORDER — VANCOMYCIN HCL IN DEXTROSE 1-5 GM/200ML-% IV SOLN
1000.0000 mg | INTRAVENOUS | Status: AC
  Administered 2023-02-13 – 2023-02-16 (×4): 1000 mg via INTRAVENOUS
  Filled 2023-02-13 (×4): qty 200

## 2023-02-13 MED ORDER — INSULIN ASPART 100 UNIT/ML IJ SOLN
0.0000 [IU] | INTRAMUSCULAR | Status: DC
  Administered 2023-02-13 (×2): 4 [IU] via SUBCUTANEOUS
  Administered 2023-02-13 – 2023-02-14 (×5): 3 [IU] via SUBCUTANEOUS
  Administered 2023-02-14 – 2023-02-15 (×2): 4 [IU] via SUBCUTANEOUS
  Administered 2023-02-15: 3 [IU] via SUBCUTANEOUS
  Administered 2023-02-15 – 2023-02-16 (×5): 4 [IU] via SUBCUTANEOUS
  Administered 2023-02-16: 7 [IU] via SUBCUTANEOUS
  Administered 2023-02-16: 4 [IU] via SUBCUTANEOUS
  Administered 2023-02-16: 11 [IU] via SUBCUTANEOUS
  Administered 2023-02-16 – 2023-02-17 (×3): 4 [IU] via SUBCUTANEOUS
  Administered 2023-02-17: 7 [IU] via SUBCUTANEOUS
  Administered 2023-02-17: 4 [IU] via SUBCUTANEOUS
  Administered 2023-02-17: 7 [IU] via SUBCUTANEOUS
  Administered 2023-02-17: 4 [IU] via SUBCUTANEOUS
  Administered 2023-02-18: 7 [IU] via SUBCUTANEOUS
  Administered 2023-02-18: 4 [IU] via SUBCUTANEOUS
  Administered 2023-02-18: 3 [IU] via SUBCUTANEOUS
  Administered 2023-02-18 (×2): 4 [IU] via SUBCUTANEOUS
  Administered 2023-02-18: 7 [IU] via SUBCUTANEOUS
  Administered 2023-02-19: 3 [IU] via SUBCUTANEOUS
  Administered 2023-02-19: 4 [IU] via SUBCUTANEOUS

## 2023-02-13 MED ORDER — METOCLOPRAMIDE HCL 5 MG/ML IJ SOLN
10.0000 mg | Freq: Three times a day (TID) | INTRAMUSCULAR | Status: DC
  Administered 2023-02-13 – 2023-02-17 (×12): 10 mg via INTRAVENOUS
  Filled 2023-02-13 (×12): qty 2

## 2023-02-13 NOTE — Progress Notes (Signed)
Patient glucose 197, MD notified. No coverage currently ordered.

## 2023-02-13 NOTE — Progress Notes (Addendum)
OG residual 40ml. Tube feeds restarted per Dr. Wynona Neat at 51ml/hr. Increase q12h by 10ml to goal of 59ml/hr.

## 2023-02-13 NOTE — Progress Notes (Signed)
Dr. Wynona Neat at bedside. Discussed concern with patient continuing to have bloody secretions when suctioned orally. Unsure if he bit his tongue or if it is truly his gums bleeding. Will continue to watch and assess for increase in bleeding.

## 2023-02-13 NOTE — Progress Notes (Signed)
[facility] ADULT ICU REPLACEMENT PROTOCOL   The patient does apply for the [facility] Adult ICU Electrolyte Replacment Protocol based on the criteria listed below:   1.Exclusion criteria: TCTS, ECMO, Dialysis, and Myasthenia Gravis patients 2. Is GFR >/= 30 ml/min? Yes.    Patient's GFR today is >60 3. Is SCr </= 2? Yes.   Patient's SCr is 1.18 mg/dL 4. Did SCr increase >/= 0.5 in 24 hours? No. 5.Pt's weight >40kg  Yes.   6. Abnormal electrolyte(s): K+ = 3.1  7. Electrolytes replaced per protocol 8.  Call MD STAT for K+ </= 2.5, Phos </= 1, or Mag </= 1 Physician:  Warrick Parisian, eMD  Suzan Slick Nigel Ericsson 02/13/2023 5:01 AM

## 2023-02-13 NOTE — Plan of Care (Addendum)
Patient remains off levo with adequate blood pressure. Fio2 30%. IV access obtained x2, however patient could benefit from line being placed due to lab having difficulty getting blood draws. Dr. Wynona Neat made aware. Patient tube feedings were stopped from 1000-1630 today due to 500 ml residual. They have been restarted at 50ml/hr. Bleeding from mouth noted during oral sxn. MD aware.    Problem: Education: Goal: Knowledge of General Education information will improve Description: Including pain rating scale, medication(s)/side effects and non-pharmacologic comfort measures Outcome: Progressing   Problem: Health Behavior/Discharge Planning: Goal: Ability to manage health-related needs will improve Outcome: Progressing   Problem: Clinical Measurements: Goal: Ability to maintain clinical measurements within normal limits will improve Outcome: Progressing Goal: Will remain free from infection Outcome: Progressing Goal: Diagnostic test results will improve Outcome: Progressing Goal: Respiratory complications will improve Outcome: Progressing Goal: Cardiovascular complication will be avoided Outcome: Progressing   Problem: Activity: Goal: Risk for activity intolerance will decrease Outcome: Progressing   Problem: Nutrition: Goal: Adequate nutrition will be maintained Outcome: Progressing   Problem: Coping: Goal: Level of anxiety will decrease Outcome: Progressing   Problem: Elimination: Goal: Will not experience complications related to bowel motility Outcome: Progressing Goal: Will not experience complications related to urinary retention Outcome: Progressing   Problem: Pain Management: Goal: General experience of comfort will improve Outcome: Progressing   Problem: Safety: Goal: Ability to remain free from injury will improve Outcome: Progressing   Problem: Skin Integrity: Goal: Risk for impaired skin integrity will decrease Outcome: Progressing   Problem:  Education: Goal: Ability to describe self-care measures that may prevent or decrease complications (Diabetes Survival Skills Education) will improve Outcome: Progressing Goal: Individualized Educational Video(s) Outcome: Progressing   Problem: Coping: Goal: Ability to adjust to condition or change in health will improve Outcome: Progressing   Problem: Fluid Volume: Goal: Ability to maintain a balanced intake and output will improve Outcome: Progressing   Problem: Health Behavior/Discharge Planning: Goal: Ability to identify and utilize available resources and services will improve Outcome: Progressing Goal: Ability to manage health-related needs will improve Outcome: Progressing   Problem: Metabolic: Goal: Ability to maintain appropriate glucose levels will improve Outcome: Progressing   Problem: Nutritional: Goal: Maintenance of adequate nutrition will improve Outcome: Not progressing Goal: Progress toward achieving an optimal weight will improve Outcome: Not progressing   Problem: Skin Integrity: Goal: Risk for impaired skin integrity will decrease Outcome: Progressing   Problem: Tissue Perfusion: Goal: Adequacy of tissue perfusion will improve Outcome: Progressing

## 2023-02-13 NOTE — Progress Notes (Signed)
Patient only has one IV present and it is leaking and bloody. Dr. Wynona Neat notified and this nurse requested line. Charge nurse in room attempting to get IV with ultrasound.

## 2023-02-13 NOTE — Progress Notes (Signed)
Tube feed held per Dr. Wynona Black. Patient had 500 residual.

## 2023-02-13 NOTE — Progress Notes (Signed)
PHARMACY NOTE:  ANTIMICROBIAL RENAL DOSAGE ADJUSTMENT  Current antimicrobial regimen includes a mismatch between antimicrobial dosage and estimated renal function.  As per policy approved by the Pharmacy & Therapeutics and Medical Executive Committees, the antimicrobial dosage will be adjusted accordingly.  Current antimicrobial dosage: vancomycin 750 mg q24h  Indication: sepsis  Renal Function:  Estimated Creatinine Clearance (by C-G formula based on SCr of 1.18 mg/dL) Male: 91.4 mL/min Male: 53.3 mL/min     Antimicrobial dosage has been changed to: vancomycin 1000 mg q24h    Thank you for allowing pharmacy to be a part of this patient's care.  Pricilla Riffle, PharmD, BCPS Clinical Pharmacist 02/13/2023 7:09 AM

## 2023-02-13 NOTE — Progress Notes (Signed)
NAME:  [name], MRN:  [id number], DOB:  06/15/1955, LOS: 3 ADMISSION DATE:  02/10/2023, CONSULTATION DATE:  11/25 REFERRING MD:  Dr. Rhunette Croft, CHIEF COMPLAINT:  acute respiratory failure w/ hypoxia; encephalopathy   History of Present Illness:  Patient is a 67 yo M w/ pertinent PMH dementia, IDDM-2, HTN, HLD and mood disorder presents to [facility] ED on 11/25 w/ ams.  Patient recently admitted last month w/ confusion/lethargy. Found to have severe sepiss from staph aureus uti. Discharged back to SNF. On 11/25 patient w/ AMS. On ems arrival patient sob and unresponsive. Sats 84% and started on supplemental O2. Transported to [facility] ED.  On arrival to [facility] ED on 11/25, patient remains altered. Febrile 100.5 and tachycardic 120s. BP stable. LA 3.8 and given IV fluids. Placed on NRB w/ sats low 90s. CBG 51 given dextrose. Cultures obtained and started on broad spectrum abx. Vbg 7.38, 41, 31, 24. CXR and CT head w/ no significant findings. Patient having diarrhea while in ED. CT abd/pelvis showing dense LLL consolidation and patchy opacities in Right lung base suspicious for pna; mild wall thickening of descending colon/sigmoid colon possible colitis; stable chronic right sided hydronephrosis. Covid/flu negative. UA w/ large leukocytes and few bacteria. Cdif antigen showing positive antigen and negative toxin; PCR pending. Patient remained encephalopathic and BP remains soft despite IV fluids. On HHFNC 80% fio2. ED plans to intubate given increase in o2 needs and poor mental status. PCCM consulted for icu admission.  Pertinent ED labs: creat 3.72, ast 58, alk phosph 204, AG 17, ammonia 18   Pertinent  Medical History   Past Medical History:  Diagnosis Date   Acute kidney injury superimposed on chronic kidney disease (HCC) 02/10/2020   Acute on chronic kidney failure (HCC) 09/28/2019   Closed nondisplaced spiral fracture of shaft of right tibia    Community acquired pneumonia 03/24/2021   Complicated  urinary tract infection 09/06/2016   Depression    Diabetes mellitus without complication (HCC)    GERD (gastroesophageal reflux disease)    Gout    right foot   Hemorrhoids    Hypertension    Macular degeneration    Microcytic anemia    PNA (pneumonia) 03/26/2021   Prostate cancer (HCC) 08/2009   Tubular adenoma      Significant Hospital Events: Including procedures, antibiotic start and stop dates in addition to other pertinent events   11/25 admitted w/ ams and respiratory failure w/ hypoxia 11/26 EEG with moderate to severe diffuse encephalopathy, no seizures or epileptiform discharges 11/27 MRI with chronic microvascular changes, no acute stroke  Interim History / Subjective:  Did receive fentanyl for agitation however not on continuous sedative  Objective   Blood pressure 101/70, pulse 91, temperature (!) 97.3 F (36.3 C), resp. rate 16, height 5\' 6"  (1.676 m), weight 65.3 kg, SpO2 100%.    Vent Mode: CPAP;PSV FiO2 (%):  [30 %-40 %] 30 % Set Rate:  [20 bmp] 20 bmp Vt Set:  [510 mL] 510 mL PEEP:  [5 cmH20] 5 cmH20 Pressure Support:  [8 cmH20-10 cmH20] 10 cmH20 Plateau Pressure:  [15 cmH20-17 cmH20] 15 cmH20   Intake/Output Summary (Last 24 hours) at 02/13/2023 1017 Last data filed at 02/13/2023 0927 Gross per 24 hour  Intake 3281.31 ml  Output 875 ml  Net 2406.31 ml   Filed Weights   02/11/23 0500 02/12/23 0500 02/13/23 0500  Weight: 62.6 kg 65.3 kg 65.3 kg    Examination: General: Critically ill-appearing,  HEENT: Moist  oral mucosa, some oral bleeding Neuro: Obvious station, was responsive earlier but did not do much for me when I saw him CV: S1-S2 appreciated PULM: Air entry bilaterally GI: soft, bsx4 active  Extremities: warm/dry, no edema  Skin: no rashes or lesions    Resolved Hospital Problem list     Assessment & Plan:   Acute respiratory failure with hypoxia Pneumonia -Tolerating weaning but mental status remains barrier to  extubation -Continue mechanical ventilation -Target TVol 6-8cc/kgIBW -Target Plateau Pressure < 30cm H20 -Target driving pressure less than 15 cm of water -Target PaO2 55-65: titrate PEEP/FiO2 per protocol -Ventilator associated pneumonia prevention protocol -On cefepime, vancomycin, azithromycin, Flagyl  C. difficile colitis -On Flagyl and oral vancomycin  Metabolic encephalopathy -Continue to limit sedating medications -MRI and EEG not suggestive of acute events -Ammonia, TSH within normal limits  Septic shock -Now off pressors  IDDM -Continue CBG monitoring -SSI initiated  Acute kidney injury Electrolyte derangement -Replete -Avoid nephrotoxic medications -Maintain renal perfusion  History of urinary retention/hydronephrosis -Continue Foley -Monitor I's and O's   Transaminitis -Continue to monitor -Improving  Hypertension, hyperlipidemia -Home antihypertensives on hold  History of mood disorder/dementia -Namenda resumed -Depakote, sertraline and Zyprexa on hold   Mental status precludes extubation at present  High residuals on tube feeds -Tube feeds on hold -Will initiate Reglan  Best Practice (right click and "Reselect all SmartList Selections" daily)   Diet/type: tubefeeds-held today with high residuals DVT prophylaxis: heparin injection 5,000 Units Start: 02/10/23 1430 SCDs Start: 02/10/23 1427  Pressure ulcer(s): not present on admission  GI prophylaxis: H2B Lines: N/A Foley:  Yes, and it is still needed Code Status:  DNR; Tonia states DNR but okay with intubation. Believes would not want to be on vent long term. Last date of multidisciplinary goals of care discussion [11/26 patient's sister [name] updated over phone.  ]  Labs   CBC: Recent Labs  Lab 02/10/23 0720 02/10/23 0730 02/11/23 0310 02/12/23 0344 02/13/23 0338  WBC 5.7  --  4.0 1.6* 1.6*  NEUTROABS 4.8  --   --   --   --   HGB 16.9 17.7* 15.1 13.4 13.3  HCT 50.5 52.0 46.9 42.2  40.6  MCV 87.7  --  89.0 90.4 87.3  PLT 195  --  147* PLATELET CLUMPS NOTED ON SMEAR, UNABLE TO ESTIMATE 47*    Basic Metabolic Panel: Recent Labs  Lab 02/10/23 0720 02/10/23 0730 02/10/23 1636 02/11/23 0310 02/11/23 0905 02/11/23 2045 02/12/23 0344 02/12/23 2129 02/13/23 0338  NA 144 146*  --  142  --   --  143  --  140  K 3.7 3.6  --  3.3*  --   --  3.5  --  3.1*  CL 107 108  --  111  --   --  113*  --  111  CO2 20*  --   --  17*  --   --  17*  --  19*  GLUCOSE 75 76  --  124*  --   --  152*  --  165*  BUN 68* 67*  --  63*  --   --  61*  --  57*  CREATININE 3.72* 3.70*  --  2.52*  --   --  1.56*  --  1.18  CALCIUM 8.1*  --   --  7.1*  --   --  7.1*  --  7.2*  MG  --   --  1.8  --  1.7  2.1 2.1 2.1  --   PHOS  --   --   --   --  3.4 2.6 2.1* 2.3*  --    GFR: Estimated Creatinine Clearance (by C-G formula based on SCr of 1.18 mg/dL) Male: 40.9 mL/min Male: 53.3 mL/min Recent Labs  Lab 02/10/23 0720 02/10/23 0730 02/10/23 1057 02/10/23 1327 02/10/23 1636 02/10/23 1934 02/11/23 0310 02/12/23 0344 02/13/23 0338  WBC 5.7  --   --   --   --   --  4.0 1.6* 1.6*  LATICACIDVEN  --    < > 4.6* 3.5* 3.0* 2.5*  --   --   --    < > = values in this interval not displayed.    Liver Function Tests: Recent Labs  Lab 02/10/23 0720 02/11/23 0310 02/12/23 0344 02/13/23 0338  AST 58* 57* 39 27  ALT 32 26 21 20   ALKPHOS 204* 197* 183* 337*  BILITOT 1.2* 1.0 0.8 1.0  PROT 7.1 5.4* 5.1* 5.1*  ALBUMIN 2.7* 2.1* 1.7* 1.8*   No results for input(s): "LIPASE", "AMYLASE" in the last 168 hours. Recent Labs  Lab 02/10/23 0850  AMMONIA 18    ABG    Component Value Date/Time   PHART 7.36 02/10/2023 1600   PCO2ART 33 02/10/2023 1600   PO2ART 143 (H) 02/10/2023 1600   HCO3 18.6 (L) 02/10/2023 1600   TCO2 23 02/10/2023 0730   ACIDBASEDEF 5.9 (H) 02/10/2023 1600   O2SAT 99.9 02/10/2023 1600     Coagulation Profile: Recent Labs  Lab 02/10/23 0720  INR 1.4*     Cardiac Enzymes: No results for input(s): "CKTOTAL", "CKMB", "CKMBINDEX", "TROPONINI" in the last 168 hours.  HbA1C: Hemoglobin A1C  Date/Time Value Ref Range Status  06/26/2016 12:00 AM 7.6  Final  06/26/2016 12:00 AM 7.6  Final   Hgb A1c MFr Bld  Date/Time Value Ref Range Status  12/28/2022 12:55 AM 7.0 (H) 4.8 - 5.6 % Final    Comment:    (NOTE) Pre diabetes:          5.7%-6.4%  Diabetes:              >6.4%  Glycemic control for   <7.0% adults with diabetes   01/13/2022 09:25 PM 9.1 (H) 4.8 - 5.6 % Final    Comment:    (NOTE) Pre diabetes:          5.7%-6.4%  Diabetes:              >6.4%  Glycemic control for   <7.0% adults with diabetes     CBG: Recent Labs  Lab 02/12/23 1550 02/12/23 1941 02/12/23 2334 02/13/23 0332 02/13/23 0726  GLUCAP 166* 139* 136* 144* 197*    Review of Systems:   Patient is encephalopathic and/or intubated; therefore, history has been obtained from chart review.    Past Medical History:  He,  has a past medical history of Acute kidney injury superimposed on chronic kidney disease (HCC) (02/10/2020), Acute on chronic kidney failure (HCC) (09/28/2019), Closed nondisplaced spiral fracture of shaft of right tibia, Community acquired pneumonia (03/24/2021), Complicated urinary tract infection (09/06/2016), Depression, Diabetes mellitus without complication (HCC), GERD (gastroesophageal reflux disease), Gout, Hemorrhoids, Hypertension, Macular degeneration, Microcytic anemia, PNA (pneumonia) (03/26/2021), Prostate cancer (HCC) (08/2009), and Tubular adenoma.   Surgical History:   Past Surgical History:  Procedure Laterality Date   CORONARY PRESSURE/FFR STUDY N/A 11/07/2021   Procedure: INTRAVASCULAR PRESSURE WIRE/FFR STUDY;  Surgeon: Orbie Pyo, MD;  Location: [facility]  INVASIVE CV LAB;  Service: Cardiovascular;  Laterality: N/A;   CORONARY STENT INTERVENTION N/A 10/29/2021   Procedure: CORONARY STENT INTERVENTION;  Surgeon: Swazil[place],  Peter M, MD;  Location: [facility] INVASIVE CV LAB;  Service: Cardiovascular;  Laterality: N/A;  Mid LAD   CYSTOSCOPY WITH RETROGRADE PYELOGRAM, URETEROSCOPY AND STENT PLACEMENT Right 07/31/2016   Procedure: CYSTOSCOPY WITH RIGHT  RETROGRADE PYELOGRAM, URETEROSCOPY;  Surgeon: Crist Fat, MD;  Location: [facility] ORS;  Service: Urology;  Laterality: Right;   INGUINAL HERNIA REPAIR     Left ankle joint fusion  1981   LEFT HEART CATH AND CORONARY ANGIOGRAPHY N/A 10/29/2021   Procedure: LEFT HEART CATH AND CORONARY ANGIOGRAPHY;  Surgeon: Swaziland, Peter M, MD;  Location: [facility] INVASIVE CV LAB;  Service: Cardiovascular;  Laterality: N/A;   LEFT HEART CATH AND CORONARY ANGIOGRAPHY N/A 11/07/2021   Procedure: LEFT HEART CATH AND CORONARY ANGIOGRAPHY;  Surgeon: Orbie Pyo, MD;  Location: [facility] INVASIVE CV LAB;  Service: Cardiovascular;  Laterality: N/A;   PROSTATE BIOPSY     x 2   URETERAL REIMPLANTION  07/31/2016   Procedure: URETERAL REIMPLANT, right boari flap, right psoas hitch;  Surgeon: Crist Fat, MD;  Location: [facility] ORS;  Service: Urology;;     Social History:   reports that he has never smoked. He has never used smokeless tobacco. He reports that he does not currently use alcohol. He reports that he does not use drugs.   Family History:  His family history includes Diabetes in an other family member; Heart disease in his father; Lung cancer in his maternal uncle and mother; Pancreatic cancer in his father; Throat cancer in his brother.   Allergies Allergies  Allergen Reactions   Haldol [Haloperidol] Other (See Comments)    Per family cause life threatening complications to patient Per Pt, it causes "complications with my mouth"     The patient is critically ill with multiple organ systems failure and requires high complexity decision making for assessment and support, frequent evaluation and titration of therapies, application of advanced monitoring technologies and extensive interpretation  of multiple databases. Critical Care Time devoted to patient care services described in this note independent of APP/resident time (if applicable)  is 32 minutes.   Virl Diamond MD [facility] Personal pager: See Amion If unanswered, please page CCM On-call: #[phone]

## 2023-02-14 ENCOUNTER — Inpatient Hospital Stay (HOSPITAL_COMMUNITY): Payer: 59

## 2023-02-14 ENCOUNTER — Other Ambulatory Visit: Payer: Self-pay

## 2023-02-14 DIAGNOSIS — J9601 Acute respiratory failure with hypoxia: Secondary | ICD-10-CM | POA: Diagnosis not present

## 2023-02-14 DIAGNOSIS — A0472 Enterocolitis due to Clostridium difficile, not specified as recurrent: Secondary | ICD-10-CM

## 2023-02-14 DIAGNOSIS — E119 Type 2 diabetes mellitus without complications: Secondary | ICD-10-CM | POA: Diagnosis not present

## 2023-02-14 DIAGNOSIS — G9340 Encephalopathy, unspecified: Secondary | ICD-10-CM

## 2023-02-14 DIAGNOSIS — N179 Acute kidney failure, unspecified: Secondary | ICD-10-CM

## 2023-02-14 LAB — GLUCOSE, CAPILLARY
Glucose-Capillary: 120 mg/dL — ABNORMAL HIGH (ref 70–99)
Glucose-Capillary: 125 mg/dL — ABNORMAL HIGH (ref 70–99)
Glucose-Capillary: 126 mg/dL — ABNORMAL HIGH (ref 70–99)
Glucose-Capillary: 134 mg/dL — ABNORMAL HIGH (ref 70–99)
Glucose-Capillary: 150 mg/dL — ABNORMAL HIGH (ref 70–99)
Glucose-Capillary: 164 mg/dL — ABNORMAL HIGH (ref 70–99)

## 2023-02-14 LAB — CBC
HCT: 41 % (ref 39.0–52.0)
Hemoglobin: 13.5 g/dL (ref 13.0–17.0)
MCH: 29 pg (ref 26.0–34.0)
MCHC: 32.9 g/dL (ref 30.0–36.0)
MCV: 88 fL (ref 80.0–100.0)
Platelets: 30 10*3/uL — ABNORMAL LOW (ref 150–400)
RBC: 4.66 MIL/uL (ref 4.22–5.81)
RDW: 19.7 % — ABNORMAL HIGH (ref 11.5–15.5)
WBC: 1.3 10*3/uL — CL (ref 4.0–10.5)
nRBC: 0 % (ref 0.0–0.2)

## 2023-02-14 LAB — COMPREHENSIVE METABOLIC PANEL
ALT: 26 U/L (ref 0–44)
AST: 34 U/L (ref 15–41)
Albumin: 1.8 g/dL — ABNORMAL LOW (ref 3.5–5.0)
Alkaline Phosphatase: 387 U/L — ABNORMAL HIGH (ref 38–126)
Anion gap: 10 (ref 5–15)
BUN: 52 mg/dL — ABNORMAL HIGH (ref 8–23)
CO2: 18 mmol/L — ABNORMAL LOW (ref 22–32)
Calcium: 7.5 mg/dL — ABNORMAL LOW (ref 8.9–10.3)
Chloride: 111 mmol/L (ref 98–111)
Creatinine, Ser: 0.93 mg/dL (ref 0.61–1.24)
GFR, Estimated: >60 mL/min (ref >60)
Glucose, Bld: 149 mg/dL — ABNORMAL HIGH (ref 70–99)
Potassium: 3.3 mmol/L — ABNORMAL LOW (ref 3.5–5.1)
Sodium: 139 mmol/L (ref 135–145)
Total Bilirubin: 1 mg/dL (ref <1.2)
Total Protein: 5.1 g/dL — ABNORMAL LOW (ref 6.5–8.1)

## 2023-02-14 LAB — TRIGLYCERIDES: Triglycerides: 201 mg/dL — ABNORMAL HIGH (ref <150)

## 2023-02-14 MED ORDER — THIAMINE HCL 100 MG/ML IJ SOLN
250.0000 mg | Freq: Two times a day (BID) | INTRAVENOUS | Status: DC
  Administered 2023-02-16 – 2023-02-18 (×6): 250 mg via INTRAVENOUS
  Filled 2023-02-14 (×9): qty 2.5

## 2023-02-14 MED ORDER — SODIUM CHLORIDE 0.9% FLUSH: 10.0000 mL | INTRAVENOUS | Status: DC | PRN

## 2023-02-14 MED ORDER — SODIUM CHLORIDE 0.9% FLUSH
10.0000 mL | Freq: Two times a day (BID) | INTRAVENOUS | Status: DC
  Administered 2023-02-14: 10 mL
  Administered 2023-02-14: 20 mL
  Administered 2023-02-15: 10 mL
  Administered 2023-02-15: 20 mL
  Administered 2023-02-16 – 2023-02-17 (×4): 10 mL
  Administered 2023-02-18: 20 mL
  Administered 2023-02-18: 10 mL

## 2023-02-14 MED ORDER — THIAMINE HCL 100 MG/ML IJ SOLN
500.0000 mg | Freq: Two times a day (BID) | INTRAVENOUS | Status: AC
  Administered 2023-02-14 – 2023-02-15 (×4): 500 mg via INTRAVENOUS
  Filled 2023-02-14 (×4): qty 5

## 2023-02-14 MED ORDER — LACTATED RINGERS IV SOLN: INTRAVENOUS | Status: AC

## 2023-02-14 MED ORDER — POTASSIUM CHLORIDE 10 MEQ/100ML IV SOLN
10.0000 meq | INTRAVENOUS | Status: AC
  Administered 2023-02-14 (×4): 10 meq via INTRAVENOUS
  Filled 2023-02-14 (×4): qty 100

## 2023-02-14 NOTE — Plan of Care (Signed)
  Problem: Education: Goal: Knowledge of General Education information will improve Description: Including pain rating scale, medication(s)/side effects and non-pharmacologic comfort measures Outcome: Not Progressing   Problem: Health Behavior/Discharge Planning: Goal: Ability to manage health-related needs will improve Outcome: Not Progressing   Problem: Coping: Goal: Level of anxiety will decrease Outcome: Not Progressing   Problem: Elimination: Goal: Will not experience complications related to bowel motility Outcome: Not Progressing Goal: Will not experience complications related to urinary retention Outcome: Not Progressing

## 2023-02-14 NOTE — Progress Notes (Signed)
Peripherally Inserted Central Catheter Placement  The IV Nurse has discussed with the patient and/or persons authorized to consent for the patient, the purpose of this procedure and the potential benefits and risks involved with this procedure.  The benefits include less needle sticks, lab draws from the catheter, and the patient may be discharged home with the catheter. Risks include, but not limited to, infection, bleeding, blood clot (thrombus formation), and puncture of an artery; nerve damage and irregular heartbeat and possibility to perform a PICC exchange if needed/ordered by physician.  Alternatives to this procedure were also discussed.  Bard Power PICC patient education guide, fact sheet on infection prevention and patient information card has been provided to patient /or left at bedside.  [name], sister of patient gave verbal consent via telephone for PICC placement.  Right A/C with significant redness noted. Per RN, lab has attempted obtaining blood numerous times. No warmth, drainage noted  PICC Placement Documentation  PICC Double Lumen 02/14/23 Right Basilic 38 cm 0 cm (Active)  Indication for Insertion or Continuance of Line Limited venous access - need for IV therapy >5 days (PICC only) 02/14/23 1300  Exposed Catheter (cm) 0 cm 02/14/23 1300  Site Assessment Clean, Dry, Intact 02/14/23 1300  Lumen #1 Status Flushed;Saline locked;Blood return noted 02/14/23 1300  Lumen #2 Status Flushed;Saline locked;Blood return noted 02/14/23 1300  Dressing Type Transparent;Securing device 02/14/23 1300  Dressing Status Antimicrobial disc in place 02/14/23 1300  Line Care Connections checked and tightened 02/14/23 1300  Line Adjustment (NICU/IV Team Only) No 02/14/23 1300  Dressing Intervention New dressing;Adhesive placed at insertion site (IV team only) 02/14/23 1300  Dressing Change Due 02/21/23 02/14/23 1300       [name]  [name] 02/14/2023, 1:11 PM

## 2023-02-14 NOTE — Progress Notes (Addendum)
NAME:  [name], MRN:  [id number], DOB:  09/24/1955, LOS: 4 ADMISSION DATE:  02/10/2023, CONSULTATION DATE:  11/25 REFERRING MD:  Dr. Rhunette Croft, CHIEF COMPLAINT:  acute respiratory failure w/ hypoxia; encephalopathy   History of Present Illness:  Patient is a 67 yo M w/ pertinent PMH dementia, IDDM-2, HTN, HLD and mood disorder presents to [facility] ED on 11/25 w/ ams.  Patient recently admitted last month w/ confusion/lethargy. Found to have severe sepiss from staph aureus uti. Discharged back to SNF. On 11/25 patient w/ AMS. On ems arrival patient sob and unresponsive. Sats 84% and started on supplemental O2. Transported to [facility] ED.  On arrival to [facility] ED on 11/25, patient remains altered. Febrile 100.5 and tachycardic 120s. BP stable. LA 3.8 and given IV fluids. Placed on NRB w/ sats low 90s. CBG 51 given dextrose. Cultures obtained and started on broad spectrum abx. Vbg 7.38, 41, 31, 24. CXR and CT head w/ no significant findings. Patient having diarrhea while in ED. CT abd/pelvis showing dense LLL consolidation and patchy opacities in Right lung base suspicious for pna; mild wall thickening of descending colon/sigmoid colon possible colitis; stable chronic right sided hydronephrosis. Covid/flu negative. UA w/ large leukocytes and few bacteria. Cdif antigen showing positive antigen and negative toxin; PCR pending. Patient remained encephalopathic and BP remains soft despite IV fluids. On HHFNC 80% fio2. ED plans to intubate given increase in o2 needs and poor mental status. PCCM consulted for icu admission.  Pertinent ED labs: creat 3.72, ast 58, alk phosph 204, AG 17, ammonia 18   Pertinent  Medical History   has a past medical history of Acute kidney injury superimposed on chronic kidney disease (HCC) (02/10/2020), Acute on chronic kidney failure (HCC) (09/28/2019), Closed nondisplaced spiral fracture of shaft of right tibia, Community acquired pneumonia (03/24/2021), Complicated urinary tract  infection (09/06/2016), Depression, Diabetes mellitus without complication (HCC), GERD (gastroesophageal reflux disease), Gout, Hemorrhoids, Hypertension, Macular degeneration, Microcytic anemia, PNA (pneumonia) (03/26/2021), Prostate cancer (HCC) (08/2009), and Tubular adenoma.   Significant Hospital Events: Including procedures, antibiotic start and stop dates in addition to other pertinent events   11/25 admitted w/ ams and respiratory failure w/ hypoxia 11/26 EEG with moderate to severe diffuse encephalopathy, no seizures or epileptiform discharges 11/27 MRI with chronic microvascular changes, no acute stroke  Interim History / Subjective:  No acute events overnight Remains poorly responsive Did receive fentanyl at 0300. Tmax 37.2  Objective   Blood pressure 106/66, pulse 83, temperature (!) 97.5 F (36.4 C), resp. rate 20, height 5\' 6"  (1.676 m), weight 65.3 kg, SpO2 99%.    Vent Mode: PRVC FiO2 (%):  [30 %] 30 % Set Rate:  [20 bmp] 20 bmp Vt Set:  [510 mL] 510 mL PEEP:  [5 cmH20] 5 cmH20 Pressure Support:  [10 cmH20] 10 cmH20 Plateau Pressure:  [17 cmH20-18 cmH20] 17 cmH20   Intake/Output Summary (Last 24 hours) at 02/14/2023 0750 Last data filed at 02/13/2023 1809 Gross per 24 hour  Intake 719.38 ml  Output 485 ml  Net 234.38 ml   Filed Weights   02/11/23 0500 02/12/23 0500 02/13/23 0500  Weight: 62.6 kg 65.3 kg 65.3 kg    Examination: General: adult male in NAD on vent HEENT: [place]/AT, PERRL, no JVD Neuro: unresponsive with exception of pain withdrawal RUE.  CV: RRR, no MRG PULM: Clear bilateral breath sounds GI: Soft, NT, ND Extremities: No acute deformity Skin: no rashes or lesions    Resolved Hospital Problem list  Septic shock Transaminitis  Assessment & Plan:   Acute respiratory failure with hypoxia Pneumonia - Tolerating weaning but mental status remains barrier to extubation - SBT as tolerated - Intermittent ABG/CXR - PRN sedation only,  receiving intermittent dosing for unclear reason. Will [facility]. RN asked to notify MD if any activity.  - RASS goal 0  C. difficile colitis -On Flagyl and oral vancomycin  Metabolic encephalopathy: etiology uncertain. -MRI and EEG not suggestive of acute events. Not explained by available labs including ammonia and TSH. No tox screen done upon arrival.  - Continue to limit sedating medications - Ongoing neuro checks - Considering neurology consult - High dose thiamine 1 week course start - check RPR  IDDM - CBG monitoring and SSI  Acute kidney injury Electrolyte derangement -Replete K -Trend BMP  History of urinary retention/hydronephrosis - Continue Foley - I&O   Hypertension, hyperlipidemia - Home antihypertensives on hold  History of mood disorder/dementia -Namenda resumed -Depakote, sertraline and Zyprexa on hold   Mental status precludes extubation at present High residuals on tube feeds -holding TF, consider re-trial 11/30 -Will start some MIVF -Reglan continue  Best Practice (right click and "Reselect all SmartList Selections" daily)   Diet/type: tubefeeds-held today with high residuals DVT prophylaxis: SCDs Start: 02/10/23 1427  Pressure ulcer(s): not present on admission  GI prophylaxis: H2B Lines: N/A Foley:  Yes, and it is still needed Code Status:  DNR; Tonia states DNR but okay with intubation. Believes would not want to be on vent long term. Last date of multidisciplinary goals of care discussion [11/26 patient's sister [name] updated over phone.  ]  Labs   CBC: Recent Labs  Lab 02/10/23 0720 02/10/23 0730 02/11/23 0310 02/12/23 0344 02/13/23 0338  WBC 5.7  --  4.0 1.6* 1.6*  NEUTROABS 4.8  --   --   --   --   HGB 16.9 17.7* 15.1 13.4 13.3  HCT 50.5 52.0 46.9 42.2 40.6  MCV 87.7  --  89.0 90.4 87.3  PLT 195  --  147* PLATELET CLUMPS NOTED ON SMEAR, UNABLE TO ESTIMATE 47*    Basic Metabolic Panel: Recent Labs  Lab 02/10/23 0720  02/10/23 0730 02/10/23 1636 02/11/23 0310 02/11/23 0905 02/11/23 2045 02/12/23 0344 02/12/23 2129 02/13/23 0338  NA 144 146*  --  142  --   --  143  --  140  K 3.7 3.6  --  3.3*  --   --  3.5  --  3.1*  CL 107 108  --  111  --   --  113*  --  111  CO2 20*  --   --  17*  --   --  17*  --  19*  GLUCOSE 75 76  --  124*  --   --  152*  --  165*  BUN 68* 67*  --  63*  --   --  61*  --  57*  CREATININE 3.72* 3.70*  --  2.52*  --   --  1.56*  --  1.18  CALCIUM 8.1*  --   --  7.1*  --   --  7.1*  --  7.2*  MG  --   --  1.8  --  1.7 2.1 2.1 2.1  --   PHOS  --   --   --   --  3.4 2.6 2.1* 2.3*  --    GFR: Estimated Creatinine Clearance (by C-G formula based on SCr of  1.18 mg/dL) Male: 91.4 mL/min Male: 53.3 mL/min Recent Labs  Lab 02/10/23 0720 02/10/23 0730 02/10/23 1057 02/10/23 1327 02/10/23 1636 02/10/23 1934 02/11/23 0310 02/12/23 0344 02/13/23 0338  WBC 5.7  --   --   --   --   --  4.0 1.6* 1.6*  LATICACIDVEN  --    < > 4.6* 3.5* 3.0* 2.5*  --   --   --    < > = values in this interval not displayed.    Liver Function Tests: Recent Labs  Lab 02/10/23 0720 02/11/23 0310 02/12/23 0344 02/13/23 0338  AST 58* 57* 39 27  ALT 32 26 21 20   ALKPHOS 204* 197* 183* 337*  BILITOT 1.2* 1.0 0.8 1.0  PROT 7.1 5.4* 5.1* 5.1*  ALBUMIN 2.7* 2.1* 1.7* 1.8*   No results for input(s): "LIPASE", "AMYLASE" in the last 168 hours. Recent Labs  Lab 02/10/23 0850  AMMONIA 18    ABG    Component Value Date/Time   PHART 7.36 02/10/2023 1600   PCO2ART 33 02/10/2023 1600   PO2ART 143 (H) 02/10/2023 1600   HCO3 18.6 (L) 02/10/2023 1600   TCO2 23 02/10/2023 0730   ACIDBASEDEF 5.9 (H) 02/10/2023 1600   O2SAT 99.9 02/10/2023 1600     Coagulation Profile: Recent Labs  Lab 02/10/23 0720  INR 1.4*    Cardiac Enzymes: No results for input(s): "CKTOTAL", "CKMB", "CKMBINDEX", "TROPONINI" in the last 168 hours.  HbA1C: Hemoglobin A1C  Date/Time Value Ref Range Status   06/26/2016 12:00 AM 7.6  Final  06/26/2016 12:00 AM 7.6  Final   Hgb A1c MFr Bld  Date/Time Value Ref Range Status  12/28/2022 12:55 AM 7.0 (H) 4.8 - 5.6 % Final    Comment:    (NOTE) Pre diabetes:          5.7%-6.4%  Diabetes:              >6.4%  Glycemic control for   <7.0% adults with diabetes   01/13/2022 09:25 PM 9.1 (H) 4.8 - 5.6 % Final    Comment:    (NOTE) Pre diabetes:          5.7%-6.4%  Diabetes:              >6.4%  Glycemic control for   <7.0% adults with diabetes     CBG: Recent Labs  Lab 02/13/23 1127 02/13/23 1535 02/13/23 1932 02/13/23 2333 02/14/23 0342  GLUCAP 188* 132* 117* 127* 126*    Review of Systems:   Patient is encephalopathic and/or intubated; therefore, history has been obtained from chart review.    Past Medical History:  He,  has a past medical history of Acute kidney injury superimposed on chronic kidney disease (HCC) (02/10/2020), Acute on chronic kidney failure (HCC) (09/28/2019), Closed nondisplaced spiral fracture of shaft of right tibia, Community acquired pneumonia (03/24/2021), Complicated urinary tract infection (09/06/2016), Depression, Diabetes mellitus without complication (HCC), GERD (gastroesophageal reflux disease), Gout, Hemorrhoids, Hypertension, Macular degeneration, Microcytic anemia, PNA (pneumonia) (03/26/2021), Prostate cancer (HCC) (08/2009), and Tubular adenoma.   Surgical History:   Past Surgical History:  Procedure Laterality Date   CORONARY PRESSURE/FFR STUDY N/A 11/07/2021   Procedure: INTRAVASCULAR PRESSURE WIRE/FFR STUDY;  Surgeon: Orbie Pyo, MD;  Location: [facility] INVASIVE CV LAB;  Service: Cardiovascular;  Laterality: N/A;   CORONARY STENT INTERVENTION N/A 10/29/2021   Procedure: CORONARY STENT INTERVENTION;  Surgeon: Swaziland, Peter M, MD;  Location: [facility] INVASIVE CV LAB;  Service: Cardiovascular;  Laterality: N/A;  Mid LAD   CYSTOSCOPY WITH RETROGRADE PYELOGRAM, URETEROSCOPY AND STENT PLACEMENT Right  07/31/2016   Procedure: CYSTOSCOPY WITH RIGHT  RETROGRADE PYELOGRAM, URETEROSCOPY;  Surgeon: Crist Fat, MD;  Location: [facility] ORS;  Service: Urology;  Laterality: Right;   INGUINAL HERNIA REPAIR     Left ankle joint fusion  1981   LEFT HEART CATH AND CORONARY ANGIOGRAPHY N/A 10/29/2021   Procedure: LEFT HEART CATH AND CORONARY ANGIOGRAPHY;  Surgeon: Swaziland, Peter M, MD;  Location: [facility] INVASIVE CV LAB;  Service: Cardiovascular;  Laterality: N/A;   LEFT HEART CATH AND CORONARY ANGIOGRAPHY N/A 11/07/2021   Procedure: LEFT HEART CATH AND CORONARY ANGIOGRAPHY;  Surgeon: Orbie Pyo, MD;  Location: [facility] INVASIVE CV LAB;  Service: Cardiovascular;  Laterality: N/A;   PROSTATE BIOPSY     x 2   URETERAL REIMPLANTION  07/31/2016   Procedure: URETERAL REIMPLANT, right boari flap, right psoas hitch;  Surgeon: Crist Fat, MD;  Location: [facility] ORS;  Service: Urology;;     Social History:   reports that he has never smoked. He has never used smokeless tobacco. He reports that he does not currently use alcohol. He reports that he does not use drugs.   Family History:  His family history includes Diabetes in an other family member; Heart disease in his father; Lung cancer in his maternal uncle and mother; Pancreatic cancer in his father; Throat cancer in his brother.   Allergies Allergies  Allergen Reactions   Haldol [Haloperidol] Other (See Comments)    Per family cause life threatening complications to patient Per Pt, it causes "complications with my mouth"     Critical care time 39 min   Joneen Roach, AGACNP-BC [facility]  See Amion for personal pager PCCM on call pager [phone] until 7pm. Please call Elink 7p-7a. [phone]  02/14/2023 8:13 AM

## 2023-02-14 NOTE — Plan of Care (Signed)
Patient remains on vent, no changes noted, patient handbook/guide at bedside, all lines and tubes patent.  Problem: Education: Goal: Knowledge of General Education information will improve Description: Including pain rating scale, medication(s)/side effects and non-pharmacologic comfort measures Outcome: Progressing   Problem: Health Behavior/Discharge Planning: Goal: Ability to manage health-related needs will improve Outcome: Progressing   Problem: Clinical Measurements: Goal: Ability to maintain clinical measurements within normal limits will improve Outcome: Progressing Goal: Will remain free from infection Outcome: Progressing Goal: Diagnostic test results will improve Outcome: Progressing Goal: Respiratory complications will improve Outcome: Progressing Goal: Cardiovascular complication will be avoided Outcome: Progressing   Problem: Activity: Goal: Risk for activity intolerance will decrease Outcome: Progressing   Problem: Nutrition: Goal: Adequate nutrition will be maintained Outcome: Progressing   Problem: Coping: Goal: Level of anxiety will decrease Outcome: Progressing   Problem: Elimination: Goal: Will not experience complications related to bowel motility Outcome: Progressing Goal: Will not experience complications related to urinary retention Outcome: Progressing   Problem: Pain Management: Goal: General experience of comfort will improve Outcome: Progressing   Problem: Safety: Goal: Ability to remain free from injury will improve Outcome: Progressing   Problem: Skin Integrity: Goal: Risk for impaired skin integrity will decrease Outcome: Progressing   Problem: Education: Goal: Ability to describe self-care measures that may prevent or decrease complications (Diabetes Survival Skills Education) will improve Outcome: Progressing Goal: Individualized Educational Video(s) Outcome: Progressing   Problem: Coping: Goal: Ability to adjust to  condition or change in health will improve Outcome: Progressing   Problem: Fluid Volume: Goal: Ability to maintain a balanced intake and output will improve Outcome: Progressing   Problem: Health Behavior/Discharge Planning: Goal: Ability to identify and utilize available resources and services will improve Outcome: Progressing Goal: Ability to manage health-related needs will improve Outcome: Progressing   Problem: Metabolic: Goal: Ability to maintain appropriate glucose levels will improve Outcome: Progressing   Problem: Nutritional: Goal: Maintenance of adequate nutrition will improve Outcome: Progressing Goal: Progress toward achieving an optimal weight will improve Outcome: Progressing   Problem: Skin Integrity: Goal: Risk for impaired skin integrity will decrease Outcome: Progressing   Problem: Tissue Perfusion: Goal: Adequacy of tissue perfusion will improve Outcome: Progressing

## 2023-02-14 NOTE — Progress Notes (Signed)
Nutrition Follow-up  DOCUMENTATION CODES:   Non-severe (moderate) malnutrition in context of chronic illness  INTERVENTION:  - Per CCM can advance back to goal TF today: Vital 1.2 at 60 ml/h (1440 ml per day) *Increase to 49mL/hr and advance by 10mL Q6H Provides 1728 kcal, 108 gm protein, 1168 ml free water daily   - Monitor magnesium, potassium, and phosphorus BID for at least 3 days, MD to replete as needed, as pt is at risk for refeeding syndrome given malnutrition.   - FWF per CCM/MD.   - Monitor weight trends.    NUTRITION DIAGNOSIS:   Moderate Malnutrition related to chronic illness as evidenced by mild fat depletion, mild muscle depletion. *ongoing  GOAL:   Patient will meet greater than or equal to 90% of their needs *progressing, on TF  MONITOR:   Vent status, Labs, Weight trends, TF tolerance  REASON FOR ASSESSMENT:   Consult Enteral/tube feeding initiation and management  ASSESSMENT:   67 yo M w/ pertinent PMH dementia, IDDM-2, HTN, HLD and mood disorder who presented with AMS.  11/25 Admit, Intubated 11/26 TF started at trickles  11/28 TF held for high residuals; restarted later in the afternoon @ 30   Patient currently intubated and sedated. No family at bedside. TF infusing at 76mL/hr at time of visit.   TF held yesterday after a high residual (500). Restarted at 69mL/hr.   Per discussion with CCM MD, can advance patient back towards goal today.    Admit weight: 137# Current weight: 143# I&O's: +5.2L + moderate pitting generalized edema  Medications reviewed and include: Colace, Miralax, Reglan, Thiamine (high dose), Q4H FWF  Labs reviewed:  K+ 3.3 HA1C 7.0 Blood Glucose 117-197 x24 hours   Diet Order:   Diet Order             Diet NPO time specified Except for: Other (See Comments)  Diet effective now                   EDUCATION NEEDS:  Not appropriate for education at this time  Skin:  Skin Assessment: Skin  Integrity Issues: Skin Integrity Issues:: Stage II, DTI DTI: Medial Sacrum Stage II: Right and Left buttocks  Last BM:  11/28 - type 7 - rectal tube  Height:  Ht Readings from Last 1 Encounters:  02/10/23 5\' 6"  (1.676 m)   Weight:  Wt Readings from Last 1 Encounters:  02/13/23 65.3 kg    BMI:  Body mass index is 23.24 kg/m.  Estimated Nutritional Needs:  Kcal:  1600-1900 kcals Protein:  95-110 grams Fluid:  >/= 1.6L    Shelle Iron RD, LDN For contact information, refer to [facility].

## 2023-02-15 ENCOUNTER — Encounter (HOSPITAL_COMMUNITY): Payer: Self-pay | Admitting: Pulmonary Disease

## 2023-02-15 DIAGNOSIS — A0472 Enterocolitis due to Clostridium difficile, not specified as recurrent: Secondary | ICD-10-CM | POA: Diagnosis not present

## 2023-02-15 DIAGNOSIS — G9340 Encephalopathy, unspecified: Secondary | ICD-10-CM | POA: Diagnosis not present

## 2023-02-15 DIAGNOSIS — J9601 Acute respiratory failure with hypoxia: Secondary | ICD-10-CM | POA: Diagnosis not present

## 2023-02-15 DIAGNOSIS — E119 Type 2 diabetes mellitus without complications: Secondary | ICD-10-CM | POA: Diagnosis not present

## 2023-02-15 LAB — COMPREHENSIVE METABOLIC PANEL
ALT: 23 U/L (ref 0–44)
AST: 29 U/L (ref 15–41)
Albumin: 1.6 g/dL — ABNORMAL LOW (ref 3.5–5.0)
Alkaline Phosphatase: 331 U/L — ABNORMAL HIGH (ref 38–126)
Anion gap: 6 (ref 5–15)
BUN: 46 mg/dL — ABNORMAL HIGH (ref 8–23)
CO2: 19 mmol/L — ABNORMAL LOW (ref 22–32)
Calcium: 7.3 mg/dL — ABNORMAL LOW (ref 8.9–10.3)
Chloride: 111 mmol/L (ref 98–111)
Creatinine, Ser: 0.85 mg/dL (ref 0.61–1.24)
GFR, Estimated: >60 mL/min (ref >60)
Glucose, Bld: 138 mg/dL — ABNORMAL HIGH (ref 70–99)
Potassium: 3.3 mmol/L — ABNORMAL LOW (ref 3.5–5.1)
Sodium: 136 mmol/L (ref 135–145)
Total Bilirubin: 0.7 mg/dL (ref <1.2)
Total Protein: 4.4 g/dL — ABNORMAL LOW (ref 6.5–8.1)

## 2023-02-15 LAB — CULTURE, BLOOD (ROUTINE X 2)
Culture: NO GROWTH
Culture: NO GROWTH
Special Requests: ADEQUATE
Special Requests: ADEQUATE

## 2023-02-15 LAB — CBC
HCT: 33.6 % — ABNORMAL LOW (ref 39.0–52.0)
Hemoglobin: 10.6 g/dL — ABNORMAL LOW (ref 13.0–17.0)
MCH: 28.1 pg (ref 26.0–34.0)
MCHC: 31.5 g/dL (ref 30.0–36.0)
MCV: 89.1 fL (ref 80.0–100.0)
Platelets: 25 10*3/uL — CL (ref 150–400)
RBC: 3.77 MIL/uL — ABNORMAL LOW (ref 4.22–5.81)
RDW: 19.4 % — ABNORMAL HIGH (ref 11.5–15.5)
WBC: 1.6 10*3/uL — ABNORMAL LOW (ref 4.0–10.5)
nRBC: 0 % (ref 0.0–0.2)

## 2023-02-15 LAB — GLUCOSE, CAPILLARY
Glucose-Capillary: 118 mg/dL — ABNORMAL HIGH (ref 70–99)
Glucose-Capillary: 167 mg/dL — ABNORMAL HIGH (ref 70–99)
Glucose-Capillary: 186 mg/dL — ABNORMAL HIGH (ref 70–99)
Glucose-Capillary: 176 mg/dL — ABNORMAL HIGH (ref 70–99)
Glucose-Capillary: 168 mg/dL — ABNORMAL HIGH (ref 70–99)
Glucose-Capillary: 171 mg/dL — ABNORMAL HIGH (ref 70–99)

## 2023-02-15 LAB — URINE CULTURE: Culture: 50000 — AB

## 2023-02-15 LAB — MAGNESIUM: Magnesium: 1.8 mg/dL (ref 1.7–2.4)

## 2023-02-15 LAB — RPR: RPR Ser Ql: NONREACTIVE

## 2023-02-15 LAB — PHOSPHORUS: Phosphorus: <1 mg/dL — CL (ref 2.5–4.6)

## 2023-02-15 MED ORDER — POTASSIUM PHOSPHATES 15 MMOLE/5ML IV SOLN
45.0000 mmol | Freq: Once | INTRAVENOUS | Status: AC
  Administered 2023-02-15: 45 mmol via INTRAVENOUS
  Filled 2023-02-15: qty 15

## 2023-02-15 MED ORDER — MAGNESIUM SULFATE 2 GM/50ML IV SOLN
2.0000 g | Freq: Once | INTRAVENOUS | Status: AC
  Administered 2023-02-15: 2 g via INTRAVENOUS
  Filled 2023-02-15: qty 50

## 2023-02-15 MED ORDER — SODIUM CHLORIDE 0.9 % IV SOLN: INTRAVENOUS | Status: AC | PRN

## 2023-02-15 NOTE — Progress Notes (Signed)
NAME:  [name], MRN:  [id number], DOB:  [birth year], LOS: 5 ADMISSION DATE:  02/10/2023, CONSULTATION DATE:  11/25 REFERRING MD:  Dr. Rhunette Croft, CHIEF COMPLAINT:  acute respiratory failure w/ hypoxia; encephalopathy   History of Present Illness:  Patient is a 67 yo M w/ pertinent PMH dementia, IDDM-2, HTN, HLD and mood disorder presents to [facility] ED on 11/25 w/ ams.  Patient recently admitted last month w/ confusion/lethargy. Found to have severe sepiss from staph aureus uti. Discharged back to SNF. On 11/25 patient w/ AMS. On ems arrival patient sob and unresponsive. Sats 84% and started on supplemental O2. Transported to [facility] ED.  On arrival to [facility] ED on 11/25, patient remains altered. Febrile 100.5 and tachycardic 120s. BP stable. LA 3.8 and given IV fluids. Placed on NRB w/ sats low 90s. CBG 51 given dextrose. Cultures obtained and started on broad spectrum abx. Vbg 7.38, 41, 31, 24. CXR and CT head w/ no significant findings. Patient having diarrhea while in ED. CT abd/pelvis showing dense LLL consolidation and patchy opacities in Right lung base suspicious for pna; mild wall thickening of descending colon/sigmoid colon possible colitis; stable chronic right sided hydronephrosis. Covid/flu negative. UA w/ large leukocytes and few bacteria. Cdif antigen showing positive antigen and negative toxin; PCR pending. Patient remained encephalopathic and BP remains soft despite IV fluids. On HHFNC 80% fio2. ED plans to intubate given increase in o2 needs and poor mental status. PCCM consulted for icu admission.  Pertinent ED labs: creat 3.72, ast 58, alk phosph 204, AG 17, ammonia 18   Pertinent  Medical History   has a past medical history of Acute kidney injury superimposed on chronic kidney disease (HCC) (02/10/2020), Acute on chronic kidney failure (HCC) (09/28/2019), Closed nondisplaced spiral fracture of shaft of right tibia, Community acquired pneumonia (03/24/2021), Complicated urinary tract  infection (09/06/2016), Depression, Diabetes mellitus without complication (HCC), GERD (gastroesophageal reflux disease), Gout, Hemorrhoids, Hypertension, Macular degeneration, Microcytic anemia, PNA (pneumonia) (03/26/2021), Prostate cancer (HCC) (08/2009), and Tubular adenoma.   Significant Hospital Events: Including procedures, antibiotic start and stop dates in addition to other pertinent events   11/25 admitted w/ ams and respiratory failure w/ hypoxia 11/26 EEG with moderate to severe diffuse encephalopathy, no seizures or epileptiform discharges 11/27 MRI with chronic microvascular changes, no acute stroke  Interim History / Subjective:  No acute events Unresponsive Off sedation  Objective   Blood pressure 120/74, pulse 88, temperature 98.8 F (37.1 C), temperature source Bladder, resp. rate 20, height 5\' 6"  (1.676 m), weight 71.6 kg, SpO2 97%.    Vent Mode: PRVC FiO2 (%):  [30 %-35 %] 30 % Set Rate:  [20 bmp] 20 bmp Vt Set:  [510 mL] 510 mL PEEP:  [5 cmH20] 5 cmH20 Pressure Support:  [5 cmH20] 5 cmH20 Plateau Pressure:  [16 cmH20-18 cmH20] 16 cmH20   Intake/Output Summary (Last 24 hours) at 02/15/2023 1340 Last data filed at 02/15/2023 1111 Gross per 24 hour  Intake 3802.93 ml  Output 1455 ml  Net 2347.93 ml   Filed Weights   02/12/23 0500 02/13/23 0500 02/15/23 0600  Weight: 65.3 kg 65.3 kg 71.6 kg    Examination: General: Elderly, does not appear to be in distress, chronically ill-appearing HEENT: Pupils equal Neuro: Not very responsive does withdraw to pain right upper extremity CV: S1-S2 appreciated PULM: Clear breath sounds GI: Bowel sounds appreciated Extremities: No acute deformity Skin: no rashes or lesions   I reviewed nursing notes,  last 24 h vitals  and pain scores, last 48 h intake and output, last 24 h labs and trends, and last 24 h imaging results. Resolved Hospital Problem list   Septic shock Transaminitis  Assessment & Plan:   Acute  hypoxemic respiratory failure Pneumonia -Continue mechanical ventilation  -Target TVol 6-8cc/kgIBW -Target Plateau Pressure < 30cm H20 -Target driving pressure less than 15 cm of water -Target PaO2 55-65: titrate PEEP/FiO2 per protocol -Ventilator associated pneumonia prevention protocol  C. difficile colitis on Flagyl and vancomycin  Metabolic encephalopathy -MRI and EEG negative -Ammonia and TSH normal -Limit sedating medications -RPR nonreactive  IDDM -CBG monitoring -SSI  Acute kidney injury -Replete electrolytes -Trend BMP  Thrombocytopenia Pancytopenia Likely in the context of sepsis -Continue to monitor  History of urinary retention/hydronephrosis -Continue Foley  Home antihypertensives on hold  History of mood disorder/dementia -On Namenda -Depakote, sertraline and Zyprexa on hold  Mental status precludes extubation  Best Practice (right click and "Reselect all SmartList Selections" daily)   Diet/type: tubefeeds DVT prophylaxis: SCDs Start: 02/10/23 1427  Pressure ulcer(s): not present on admission  GI prophylaxis: H2B Lines: N/A Foley:  Yes, and it is still needed Code Status:  DNR; Tonia states DNR but okay with intubation. Believes would not want to be on vent long term. Last date of multidisciplinary goals of care discussion [11/26 patient's sister [name] updated over phone.  ]  Labs   CBC: Recent Labs  Lab 02/10/23 0720 02/10/23 0730 02/11/23 0310 02/12/23 0344 02/13/23 0338 02/14/23 0714 02/15/23 0322  WBC 5.7  --  4.0 1.6* 1.6* 1.3* 1.6*  NEUTROABS 4.8  --   --   --   --   --   --   HGB 16.9   < > 15.1 13.4 13.3 13.5 10.6*  HCT 50.5   < > 46.9 42.2 40.6 41.0 33.6*  MCV 87.7  --  89.0 90.4 87.3 88.0 89.1  PLT 195  --  147* PLATELET CLUMPS NOTED ON SMEAR, UNABLE TO ESTIMATE 47* 30* 25*   < > = values in this interval not displayed.    Basic Metabolic Panel: Recent Labs  Lab 02/11/23 0310 02/11/23 0905 02/11/23 2045  02/12/23 0344 02/12/23 2129 02/13/23 0338 02/14/23 0714 02/15/23 0322  NA 142  --   --  143  --  140 139 136  K 3.3*  --   --  3.5  --  3.1* 3.3* 3.3*  CL 111  --   --  113*  --  111 111 111  CO2 17*  --   --  17*  --  19* 18* 19*  GLUCOSE 124*  --   --  152*  --  165* 149* 138*  BUN 63*  --   --  61*  --  57* 52* 46*  CREATININE 2.52*  --   --  1.56*  --  1.18 0.93 0.85  CALCIUM 7.1*  --   --  7.1*  --  7.2* 7.5* 7.3*  MG  --  1.7 2.1 2.1 2.1  --   --  1.8  PHOS  --  3.4 2.6 2.1* 2.3*  --   --  <1.0*   GFR: Estimated Creatinine Clearance (by C-G formula based on SCr of 0.85 mg/dL) Male: 78.2 mL/min Male: 74 mL/min Recent Labs  Lab 02/10/23 1057 02/10/23 1327 02/10/23 1636 02/10/23 1934 02/11/23 0310 02/12/23 0344 02/13/23 0338 02/14/23 0714 02/15/23 0322  WBC  --   --   --   --    < >  1.6* 1.6* 1.3* 1.6*  LATICACIDVEN 4.6* 3.5* 3.0* 2.5*  --   --   --   --   --    < > = values in this interval not displayed.    Liver Function Tests: Recent Labs  Lab 02/11/23 0310 02/12/23 0344 02/13/23 0338 02/14/23 0714 02/15/23 0322  AST 57* 39 27 34 29  ALT 26 21 20 26 23   ALKPHOS 197* 183* 337* 387* 331*  BILITOT 1.0 0.8 1.0 1.0 0.7  PROT 5.4* 5.1* 5.1* 5.1* 4.4*  ALBUMIN 2.1* 1.7* 1.8* 1.8* 1.6*   No results for input(s): "LIPASE", "AMYLASE" in the last 168 hours. Recent Labs  Lab 02/10/23 0850  AMMONIA 18    ABG    Component Value Date/Time   PHART 7.36 02/10/2023 1600   PCO2ART 33 02/10/2023 1600   PO2ART 143 (H) 02/10/2023 1600   HCO3 18.6 (L) 02/10/2023 1600   TCO2 23 02/10/2023 0730   ACIDBASEDEF 5.9 (H) 02/10/2023 1600   O2SAT 99.9 02/10/2023 1600     Coagulation Profile: Recent Labs  Lab 02/10/23 0720  INR 1.4*    Cardiac Enzymes: No results for input(s): "CKTOTAL", "CKMB", "CKMBINDEX", "TROPONINI" in the last 168 hours.  HbA1C: Hemoglobin A1C  Date/Time Value Ref Range Status  06/26/2016 12:00 AM 7.6  Final  06/26/2016 12:00 AM 7.6   Final   Hgb A1c MFr Bld  Date/Time Value Ref Range Status  12/28/2022 12:55 AM 7.0 (H) 4.8 - 5.6 % Final    Comment:    (NOTE) Pre diabetes:          5.7%-6.4%  Diabetes:              >6.4%  Glycemic control for   <7.0% adults with diabetes   01/13/2022 09:25 PM 9.1 (H) 4.8 - 5.6 % Final    Comment:    (NOTE) Pre diabetes:          5.7%-6.4%  Diabetes:              >6.4%  Glycemic control for   <7.0% adults with diabetes     CBG: Recent Labs  Lab 02/14/23 1946 02/14/23 2346 02/15/23 0321 02/15/23 0800 02/15/23 1115  GLUCAP 120* 134* 118* 167* 186*    Review of Systems:   Patient is encephalopathic and/or intubated; therefore, history has been obtained from chart review.    Past Medical History:  He,  has a past medical history of Acute kidney injury superimposed on chronic kidney disease (HCC) (02/10/2020), Acute on chronic kidney failure (HCC) (09/28/2019), Closed nondisplaced spiral fracture of shaft of right tibia, Community acquired pneumonia (03/24/2021), Complicated urinary tract infection (09/06/2016), Depression, Diabetes mellitus without complication (HCC), GERD (gastroesophageal reflux disease), Gout, Hemorrhoids, Hypertension, Macular degeneration, Microcytic anemia, PNA (pneumonia) (03/26/2021), Prostate cancer (HCC) (08/2009), and Tubular adenoma.   Surgical History:   Past Surgical History:  Procedure Laterality Date   CORONARY PRESSURE/FFR STUDY N/A 11/07/2021   Procedure: INTRAVASCULAR PRESSURE WIRE/FFR STUDY;  Surgeon: Orbie Pyo, MD;  Location: [facility] INVASIVE CV LAB;  Service: Cardiovascular;  Laterality: N/A;   CORONARY STENT INTERVENTION N/A 10/29/2021   Procedure: CORONARY STENT INTERVENTION;  Surgeon: Swaziland, Peter M, MD;  Location: [facility] INVASIVE CV LAB;  Service: Cardiovascular;  Laterality: N/A;  Mid LAD   CYSTOSCOPY WITH RETROGRADE PYELOGRAM, URETEROSCOPY AND STENT PLACEMENT Right 07/31/2016   Procedure: CYSTOSCOPY WITH RIGHT  RETROGRADE  PYELOGRAM, URETEROSCOPY;  Surgeon: Crist Fat, MD;  Location: [facility] ORS;  Service: Urology;  Laterality: Right;   INGUINAL HERNIA REPAIR     Left ankle joint fusion  1981   LEFT HEART CATH AND CORONARY ANGIOGRAPHY N/A 10/29/2021   Procedure: LEFT HEART CATH AND CORONARY ANGIOGRAPHY;  Surgeon: Swaziland, Peter M, MD;  Location: [facility] INVASIVE CV LAB;  Service: Cardiovascular;  Laterality: N/A;   LEFT HEART CATH AND CORONARY ANGIOGRAPHY N/A 11/07/2021   Procedure: LEFT HEART CATH AND CORONARY ANGIOGRAPHY;  Surgeon: Orbie Pyo, MD;  Location: [facility] INVASIVE CV LAB;  Service: Cardiovascular;  Laterality: N/A;   PROSTATE BIOPSY     x 2   URETERAL REIMPLANTION  07/31/2016   Procedure: URETERAL REIMPLANT, right boari flap, right psoas hitch;  Surgeon: Crist Fat, MD;  Location: [facility] ORS;  Service: Urology;;     Social History:   reports that he has never smoked. He has never used smokeless tobacco. He reports that he does not currently use alcohol. He reports that he does not use drugs.   Family History:  His family history includes Diabetes in an other family member; Heart disease in his father; Lung cancer in his maternal uncle and mother; Pancreatic cancer in his father; Throat cancer in his brother.   Allergies Allergies  Allergen Reactions   Haldol [Haloperidol] Other (See Comments)    Per family cause life threatening complications to patient Per Pt, it causes "complications with my mouth"     The patient is critically ill with multiple organ systems failure and requires high complexity decision making for assessment and support, frequent evaluation and titration of therapies, application of advanced monitoring technologies and extensive interpretation of multiple databases. Critical Care Time devoted to patient care services described in this note independent of APP/resident time (if applicable)  is 31 minutes.   Virl Diamond MD [facility] Personal pager:  See Amion If unanswered, please page CCM On-call: #[phone]

## 2023-02-16 DIAGNOSIS — J9601 Acute respiratory failure with hypoxia: Secondary | ICD-10-CM | POA: Diagnosis not present

## 2023-02-16 DIAGNOSIS — A0472 Enterocolitis due to Clostridium difficile, not specified as recurrent: Secondary | ICD-10-CM | POA: Diagnosis not present

## 2023-02-16 DIAGNOSIS — G9340 Encephalopathy, unspecified: Secondary | ICD-10-CM | POA: Diagnosis not present

## 2023-02-16 DIAGNOSIS — E119 Type 2 diabetes mellitus without complications: Secondary | ICD-10-CM | POA: Diagnosis not present

## 2023-02-16 LAB — CBC WITH DIFFERENTIAL/PLATELET
Abs Immature Granulocytes: 0.1 10*3/uL — ABNORMAL HIGH (ref 0.00–0.07)
Band Neutrophils: 2 %
Basophils Absolute: 0 10*3/uL (ref 0.0–0.1)
Basophils Relative: 0 %
Eosinophils Absolute: 0.1 10*3/uL (ref 0.0–0.5)
Eosinophils Relative: 2 %
HCT: 30.7 % — ABNORMAL LOW (ref 39.0–52.0)
Hemoglobin: 10.3 g/dL — ABNORMAL LOW (ref 13.0–17.0)
Lymphocytes Relative: 20 %
Lymphs Abs: 0.6 10*3/uL — ABNORMAL LOW (ref 0.7–4.0)
MCH: 29 pg (ref 26.0–34.0)
MCHC: 33.6 g/dL (ref 30.0–36.0)
MCV: 86.5 fL (ref 80.0–100.0)
Monocytes Absolute: 0.5 10*3/uL (ref 0.1–1.0)
Monocytes Relative: 18 %
Myelocytes: 4 %
Neutro Abs: 1.6 10*3/uL — ABNORMAL LOW (ref 1.7–7.7)
Neutrophils Relative %: 54 %
Platelets: 49 10*3/uL — ABNORMAL LOW (ref 150–400)
RBC: 3.55 MIL/uL — ABNORMAL LOW (ref 4.22–5.81)
RDW: 19.2 % — ABNORMAL HIGH (ref 11.5–15.5)
WBC: 2.9 10*3/uL — ABNORMAL LOW (ref 4.0–10.5)
nRBC: 0 % (ref 0.0–0.2)

## 2023-02-16 LAB — COMPREHENSIVE METABOLIC PANEL
ALT: 21 U/L (ref 0–44)
AST: 21 U/L (ref 15–41)
Albumin: 1.6 g/dL — ABNORMAL LOW (ref 3.5–5.0)
Alkaline Phosphatase: 340 U/L — ABNORMAL HIGH (ref 38–126)
Anion gap: 5 (ref 5–15)
BUN: 40 mg/dL — ABNORMAL HIGH (ref 8–23)
CO2: 21 mmol/L — ABNORMAL LOW (ref 22–32)
Calcium: 7.2 mg/dL — ABNORMAL LOW (ref 8.9–10.3)
Chloride: 107 mmol/L (ref 98–111)
Creatinine, Ser: 0.82 mg/dL (ref 0.61–1.24)
GFR, Estimated: >60 mL/min (ref >60)
Glucose, Bld: 191 mg/dL — ABNORMAL HIGH (ref 70–99)
Potassium: 3.2 mmol/L — ABNORMAL LOW (ref 3.5–5.1)
Sodium: 133 mmol/L — ABNORMAL LOW (ref 135–145)
Total Bilirubin: 0.6 mg/dL (ref <1.2)
Total Protein: 4.7 g/dL — ABNORMAL LOW (ref 6.5–8.1)

## 2023-02-16 LAB — GLUCOSE, CAPILLARY
Glucose-Capillary: 170 mg/dL — ABNORMAL HIGH (ref 70–99)
Glucose-Capillary: 164 mg/dL — ABNORMAL HIGH (ref 70–99)
Glucose-Capillary: 181 mg/dL — ABNORMAL HIGH (ref 70–99)
Glucose-Capillary: 194 mg/dL — ABNORMAL HIGH (ref 70–99)
Glucose-Capillary: 215 mg/dL — ABNORMAL HIGH (ref 70–99)
Glucose-Capillary: 253 mg/dL — ABNORMAL HIGH (ref 70–99)

## 2023-02-16 LAB — MAGNESIUM: Magnesium: 1.9 mg/dL (ref 1.7–2.4)

## 2023-02-16 LAB — PHOSPHORUS: Phosphorus: 2 mg/dL — ABNORMAL LOW (ref 2.5–4.6)

## 2023-02-16 MED ORDER — SODIUM CHLORIDE 0.9 % IV SOLN: INTRAVENOUS | Status: AC | PRN

## 2023-02-16 MED ORDER — METHYLPHENIDATE HCL 5 MG PO TABS
5.0000 mg | ORAL_TABLET | Freq: Every day | ORAL | Status: DC
  Administered 2023-02-16 – 2023-02-17 (×2): 5 mg
  Filled 2023-02-16 (×2): qty 1

## 2023-02-16 MED ORDER — POTASSIUM PHOSPHATES 15 MMOLE/5ML IV SOLN
30.0000 mmol | Freq: Once | INTRAVENOUS | Status: AC
  Administered 2023-02-16: 30 mmol via INTRAVENOUS
  Filled 2023-02-16: qty 10

## 2023-02-16 NOTE — Progress Notes (Signed)
   02/16/23 1539  Adult Ventilator Settings  Vent Mode (S)  PRVC (Changed back to [facility], pt weaned a total of 5 hrs. 50 mins. on PSV 10/5.)

## 2023-02-16 NOTE — Progress Notes (Signed)
Done by RN

## 2023-02-16 NOTE — Progress Notes (Signed)
   02/16/23 0952  Vent Select  Invasive or Noninvasive Invasive  Adult Vent Y  Airway 7.5 mm  Placement Date/Time: 02/10/23 1415   Placed By: ED Physician  Airway Device: Endotracheal Tube  Laryngoscope Blade: 3  ETT Types: Endobronchial  Size (mm): 7.5 mm  Cuffed: Cuffed  Airway Equipment: Stylet;Video Laryngoscope  Placement Confirmation: (c...  Secured at (cm) 25 cm  Adult Ventilator Settings  Vent Type Servo i  Humidity HME  Vent Mode (S)  PSV;CPAP  FiO2 (%) (S)  30 %  Pressure Support (S)  10 cmH20  PEEP (S)  5 cmH20  Adult Ventilator Measurements  Peak Airway Pressure 15 L/min  Mean Airway Pressure 9 cmH20  Resp Rate Spontaneous 19 br/min  Resp Rate Total 19 br/min  Spont TV 442 mL  Measured Ve 4 L  Total PEEP 5 cmH20  SpO2 97 %  Adult Ventilator Alarms  Alarms On Y  Ve High Alarm 16 L/min  Ve Low Alarm 4 L/min  Resp Rate High Alarm 40 br/min  Resp Rate Low Alarm 15  PEEP Low Alarm 3 cmH2O  Press High Alarm 54 cmH2O  T Apnea 20 sec(s)  VAP Prevention  HOB> 30 Degrees Y

## 2023-02-16 NOTE — Plan of Care (Signed)
Problem: Education: Goal: Knowledge of General Education information will improve Description: Including pain rating scale, medication(s)/side effects and non-pharmacologic comfort measures 02/16/2023 2034 by Justus Memory, RN Outcome: Progressing 02/16/2023 0705 by Justus Memory, RN Outcome: Progressing   Problem: Health Behavior/Discharge Planning: Goal: Ability to manage health-related needs will improve 02/16/2023 2034 by Justus Memory, RN Outcome: Progressing 02/16/2023 0705 by Justus Memory, RN Outcome: Progressing   Problem: Clinical Measurements: Goal: Ability to maintain clinical measurements within normal limits will improve 02/16/2023 2034 by Justus Memory, RN Outcome: Progressing 02/16/2023 0705 by Justus Memory, RN Outcome: Progressing Goal: Will remain free from infection 02/16/2023 2034 by Justus Memory, RN Outcome: Progressing 02/16/2023 0705 by Justus Memory, RN Outcome: Progressing Goal: Diagnostic test results will improve 02/16/2023 2034 by Justus Memory, RN Outcome: Progressing 02/16/2023 0705 by Justus Memory, RN Outcome: Progressing Goal: Respiratory complications will improve 02/16/2023 2034 by Justus Memory, RN Outcome: Progressing 02/16/2023 0705 by Justus Memory, RN Outcome: Progressing Goal: Cardiovascular complication will be avoided 02/16/2023 2034 by Justus Memory, RN Outcome: Progressing 02/16/2023 0705 by Justus Memory, RN Outcome: Progressing   Problem: Activity: Goal: Risk for activity intolerance will decrease 02/16/2023 2034 by Justus Memory, RN Outcome: Progressing 02/16/2023 0705 by Justus Memory, RN Outcome: Progressing   Problem: Nutrition: Goal: Adequate nutrition will be maintained 02/16/2023 2034 by Justus Memory, RN Outcome: Progressing 02/16/2023 0705 by Justus Memory, RN Outcome: Progressing   Problem: Coping: Goal: Level of anxiety will decrease 02/16/2023 2034 by Justus Memory, RN Outcome: Progressing 02/16/2023 0705 by Justus Memory, RN Outcome: Progressing   Problem: Elimination: Goal: Will not experience complications related to bowel motility 02/16/2023 2034 by Justus Memory, RN Outcome: Progressing 02/16/2023 0705 by Justus Memory, RN Outcome: Progressing Goal: Will not experience complications related to urinary retention 02/16/2023 2034 by Justus Memory, RN Outcome: Progressing 02/16/2023 0705 by Justus Memory, RN Outcome: Progressing   Problem: Pain Management: Goal: General experience of comfort will improve 02/16/2023 2034 by Justus Memory, RN Outcome: Progressing 02/16/2023 0705 by Justus Memory, RN Outcome: Progressing   Problem: Safety: Goal: Ability to remain free from injury will improve 02/16/2023 2034 by Justus Memory, RN Outcome: Progressing 02/16/2023 0705 by Justus Memory, RN Outcome: Progressing   Problem: Skin Integrity: Goal: Risk for impaired skin integrity will decrease 02/16/2023 2034 by Justus Memory, RN Outcome: Progressing 02/16/2023 0705 by Justus Memory, RN Outcome: Progressing   Problem: Education: Goal: Ability to describe self-care measures that may prevent or decrease complications (Diabetes Survival Skills Education) will improve 02/16/2023 2034 by Justus Memory, RN Outcome: Progressing 02/16/2023 0705 by Justus Memory, RN Outcome: Progressing Goal: Individualized Educational Video(s) 02/16/2023 2034 by Justus Memory, RN Outcome: Progressing 02/16/2023 0705 by Justus Memory, RN Outcome: Progressing   Problem: Coping: Goal: Ability to adjust to condition or change in health will improve 02/16/2023 2034 by Justus Memory, RN Outcome: Progressing 02/16/2023 0705 by Justus Memory, RN Outcome: Progressing   Problem: Fluid Volume: Goal: Ability to maintain a balanced intake and output will improve 02/16/2023 2034 by Justus Memory, RN Outcome:  Progressing 02/16/2023 0705 by Justus Memory, RN Outcome: Progressing   Problem: Health Behavior/Discharge Planning: Goal: Ability to identify and utilize available resources and services will improve 02/16/2023 2034 by Justus Memory, RN Outcome: Progressing 02/16/2023 0705 by Justus Memory,  RN Outcome: Progressing Goal: Ability to manage health-related needs will improve 02/16/2023 2034 by Justus Memory, RN Outcome: Progressing 02/16/2023 0705 by Justus Memory, RN Outcome: Progressing   Problem: Metabolic: Goal: Ability to maintain appropriate glucose levels will improve 02/16/2023 2034 by Justus Memory, RN Outcome: Progressing 02/16/2023 0705 by Justus Memory, RN Outcome: Progressing   Problem: Nutritional: Goal: Maintenance of adequate nutrition will improve 02/16/2023 2034 by Justus Memory, RN Outcome: Progressing 02/16/2023 0705 by Justus Memory, RN Outcome: Progressing Goal: Progress toward achieving an optimal weight will improve 02/16/2023 2034 by Justus Memory, RN Outcome: Progressing 02/16/2023 0705 by Justus Memory, RN Outcome: Progressing   Problem: Skin Integrity: Goal: Risk for impaired skin integrity will decrease 02/16/2023 2034 by Justus Memory, RN Outcome: Progressing 02/16/2023 0705 by Justus Memory, RN Outcome: Progressing   Problem: Tissue Perfusion: Goal: Adequacy of tissue perfusion will improve 02/16/2023 2034 by Justus Memory, RN Outcome: Progressing 02/16/2023 0705 by Justus Memory, RN Outcome: Progressing

## 2023-02-16 NOTE — Progress Notes (Addendum)
NAME:  [name], MRN:  [id number], DOB:  Jun 02, 1955, LOS: 6 ADMISSION DATE:  02/10/2023, CONSULTATION DATE:  11/25 REFERRING MD:  Dr. Rhunette Croft, CHIEF COMPLAINT:  acute respiratory failure w/ hypoxia; encephalopathy   History of Present Illness:  Patient is a 67 yo M w/ pertinent PMH dementia, IDDM-2, HTN, HLD and mood disorder presents to [facility] ED on 11/25 w/ ams.  Patient recently admitted last month w/ confusion/lethargy. Found to have severe sepiss from staph aureus uti. Discharged back to SNF. On 11/25 patient w/ AMS. On ems arrival patient sob and unresponsive. Sats 84% and started on supplemental O2. Transported to [facility] ED.  On arrival to [facility] ED on 11/25, patient remains altered. Febrile 100.5 and tachycardic 120s. BP stable. LA 3.8 and given IV fluids. Placed on NRB w/ sats low 90s. CBG 51 given dextrose. Cultures obtained and started on broad spectrum abx. Vbg 7.38, 41, 31, 24. CXR and CT head w/ no significant findings. Patient having diarrhea while in ED. CT abd/pelvis showing dense LLL consolidation and patchy opacities in Right lung base suspicious for pna; mild wall thickening of descending colon/sigmoid colon possible colitis; stable chronic right sided hydronephrosis. Covid/flu negative. UA w/ large leukocytes and few bacteria. Cdif antigen showing positive antigen and negative toxin; PCR pending. Patient remained encephalopathic and BP remains soft despite IV fluids. On HHFNC 80% fio2. ED plans to intubate given increase in o2 needs and poor mental status. PCCM consulted for icu admission.  Pertinent ED labs: creat 3.72, ast 58, alk phosph 204, AG 17, ammonia 18   Pertinent  Medical History   has a past medical history of Acute kidney injury superimposed on chronic kidney disease (HCC) (02/10/2020), Acute on chronic kidney failure (HCC) (09/28/2019), Closed nondisplaced spiral fracture of shaft of right tibia, Community acquired pneumonia (03/24/2021), Complicated urinary tract  infection (09/06/2016), Depression, Diabetes mellitus without complication (HCC), GERD (gastroesophageal reflux disease), Gout, Hemorrhoids, Hypertension, Macular degeneration, Microcytic anemia, PNA (pneumonia) (03/26/2021), Prostate cancer (HCC) (08/2009), and Tubular adenoma.   Significant Hospital Events: Including procedures, antibiotic start and stop dates in addition to other pertinent events   11/25 admitted w/ ams and respiratory failure w/ hypoxia 11/26 EEG with moderate to severe diffuse encephalopathy, no seizures or epileptiform discharges 11/27 MRI with chronic microvascular changes, no acute stroke 12/1 tolerating weaning pressure support 10/5  Interim History / Subjective:  No acute events still remains unresponsive Off sedation tolerating weaning today  Objective   Blood pressure (!) 150/129, pulse (!) 113, temperature 98.4 F (36.9 C), resp. rate 16, height 5\' 6"  (1.676 m), weight 72.6 kg, SpO2 95%.    Vent Mode: PSV;CPAP FiO2 (%):  [30 %] 30 % Set Rate:  [20 bmp] 20 bmp Vt Set:  [510 mL] 510 mL PEEP:  [5 cmH20] 5 cmH20 Pressure Support:  [10 cmH20] 10 cmH20 Plateau Pressure:  [14 cmH20-18 cmH20] 14 cmH20   Intake/Output Summary (Last 24 hours) at 02/16/2023 1427 Last data filed at 02/16/2023 1208 Gross per 24 hour  Intake 2914.89 ml  Output 1820 ml  Net 1094.89 ml   Filed Weights   02/13/23 0500 02/15/23 0600 02/16/23 0600  Weight: 65.3 kg 71.6 kg 72.6 kg    Examination: General: Elderly, chronically ill-appearing HEENT: Pupils equal Neuro: Not very responsive, does withdraw to painful stimuli CV: S1-S2 appreciated PULM: Fair breath sounds bilaterally GI: Bowel sounds appreciated Extremities: No acute deformity Skin: no rashes or lesions   I reviewed nursing notes,  last 24 h  vitals and pain scores, last 48 h intake and output, last 24 h labs and trends, and last 24 h imaging results.  Resolved Hospital Problem list   Septic  shock Transaminitis  Assessment & Plan:   Acute hypoxemic respiratory failure Pneumonia -Continue mechanical ventilation  -Target TVol 6-8cc/kgIBW -Target Plateau Pressure < 30cm H20 -Target driving pressure less than 15 cm of water -Target PaO2 55-65: titrate PEEP/FiO2 per protocol -Ventilator associated pneumonia prevention protocol -Completed antibiotics for pneumonia  C. difficile colitis on Flagyl and vancomycin  Metabolic encephalopathy MRI and EEG negative Ammonia and TSH normal Has not been on any sedation Possible hypoactive delirium -Will trial neurostimulant -Methylphenidate 5 mg per tube -Can be increased to twice daily dosing if benefit  IDDM -Continue SSI  Acute kidney injury -Replete electrolytes -Trend BMP  Thrombocytopenia Pancytopenia Likely in the context of sepsis -Continue to monitor  History of urinary retention/hydronephrosis -Continue Foley  Continue to hold antihypertensives  History of mood disorder/dementia -On Namenda -Depakote, sertraline and Zyprexa on hold  Mental status precludes extubation  Best Practice (right click and "Reselect all SmartList Selections" daily)   Diet/type: tubefeeds DVT prophylaxis: SCDs Start: 02/10/23 1427  Pressure ulcer(s): not present on admission  GI prophylaxis: H2B Lines: N/A Foley:  Yes, and it is still needed Code Status:  DNR; Tonia states DNR but okay with intubation. Believes would not want to be on vent long term. Last date of multidisciplinary goals of care discussion [11/26 patient's sister [name] updated over phone.  ]  Labs   CBC: Recent Labs  Lab 02/10/23 0720 02/10/23 0730 02/12/23 0344 02/13/23 0338 02/14/23 0714 02/15/23 0322 02/16/23 0651  WBC 5.7   < > 1.6* 1.6* 1.3* 1.6* 2.9*  NEUTROABS 4.8  --   --   --   --   --  1.6*  HGB 16.9   < > 13.4 13.3 13.5 10.6* 10.3*  HCT 50.5   < > 42.2 40.6 41.0 33.6* 30.7*  MCV 87.7   < > 90.4 87.3 88.0 89.1 86.5  PLT 195   < >  PLATELET CLUMPS NOTED ON SMEAR, UNABLE TO ESTIMATE 47* 30* 25* 49*   < > = values in this interval not displayed.    Basic Metabolic Panel: Recent Labs  Lab 02/11/23 2045 02/12/23 0344 02/12/23 2129 02/13/23 0338 02/14/23 0714 02/15/23 0322 02/16/23 0651  NA  --  143  --  140 139 136 133*  K  --  3.5  --  3.1* 3.3* 3.3* 3.2*  CL  --  113*  --  111 111 111 107  CO2  --  17*  --  19* 18* 19* 21*  GLUCOSE  --  152*  --  165* 149* 138* 191*  BUN  --  61*  --  57* 52* 46* 40*  CREATININE  --  1.56*  --  1.18 0.93 0.85 0.82  CALCIUM  --  7.1*  --  7.2* 7.5* 7.3* 7.2*  MG 2.1 2.1 2.1  --   --  1.8 1.9  PHOS 2.6 2.1* 2.3*  --   --  <1.0* 2.0*   GFR: Estimated Creatinine Clearance (by C-G formula based on SCr of 0.82 mg/dL) Male: 66 mL/min Male: 76.7 mL/min Recent Labs  Lab 02/10/23 1057 02/10/23 1327 02/10/23 1636 02/10/23 1934 02/11/23 0310 02/13/23 0338 02/14/23 0714 02/15/23 0322 02/16/23 0651  WBC  --   --   --   --    < > 1.6* 1.3* 1.6*  2.9*  LATICACIDVEN 4.6* 3.5* 3.0* 2.5*  --   --   --   --   --    < > = values in this interval not displayed.    Liver Function Tests: Recent Labs  Lab 02/12/23 0344 02/13/23 0338 02/14/23 0714 02/15/23 0322 02/16/23 0651  AST 39 27 34 29 21  ALT 21 20 26 23 21   ALKPHOS 183* 337* 387* 331* 340*  BILITOT 0.8 1.0 1.0 0.7 0.6  PROT 5.1* 5.1* 5.1* 4.4* 4.7*  ALBUMIN 1.7* 1.8* 1.8* 1.6* 1.6*   No results for input(s): "LIPASE", "AMYLASE" in the last 168 hours. Recent Labs  Lab 02/10/23 0850  AMMONIA 18    ABG    Component Value Date/Time   PHART 7.36 02/10/2023 1600   PCO2ART 33 02/10/2023 1600   PO2ART 143 (H) 02/10/2023 1600   HCO3 18.6 (L) 02/10/2023 1600   TCO2 23 02/10/2023 0730   ACIDBASEDEF 5.9 (H) 02/10/2023 1600   O2SAT 99.9 02/10/2023 1600     Coagulation Profile: Recent Labs  Lab 02/10/23 0720  INR 1.4*    Cardiac Enzymes: No results for input(s): "CKTOTAL", "CKMB", "CKMBINDEX", "TROPONINI"  in the last 168 hours.  HbA1C: Hemoglobin A1C  Date/Time Value Ref Range Status  06/26/2016 12:00 AM 7.6  Final  06/26/2016 12:00 AM 7.6  Final   Hgb A1c MFr Bld  Date/Time Value Ref Range Status  12/28/2022 12:55 AM 7.0 (H) 4.8 - 5.6 % Final    Comment:    (NOTE) Pre diabetes:          5.7%-6.4%  Diabetes:              >6.4%  Glycemic control for   <7.0% adults with diabetes   01/13/2022 09:25 PM 9.1 (H) 4.8 - 5.6 % Final    Comment:    (NOTE) Pre diabetes:          5.7%-6.4%  Diabetes:              >6.4%  Glycemic control for   <7.0% adults with diabetes     CBG: Recent Labs  Lab 02/15/23 1951 02/15/23 2325 02/16/23 0330 02/16/23 0753 02/16/23 1137  GLUCAP 168* 171* 170* 164* 181*    Review of Systems:   Patient is encephalopathic and/or intubated; therefore, history has been obtained from chart review.    Past Medical History:  He,  has a past medical history of Acute kidney injury superimposed on chronic kidney disease (HCC) (02/10/2020), Acute on chronic kidney failure (HCC) (09/28/2019), Closed nondisplaced spiral fracture of shaft of right tibia, Community acquired pneumonia (03/24/2021), Complicated urinary tract infection (09/06/2016), Depression, Diabetes mellitus without complication (HCC), GERD (gastroesophageal reflux disease), Gout, Hemorrhoids, Hypertension, Macular degeneration, Microcytic anemia, PNA (pneumonia) (03/26/2021), Prostate cancer (HCC) (08/2009), and Tubular adenoma.   Surgical History:   Past Surgical History:  Procedure Laterality Date   CORONARY PRESSURE/FFR STUDY N/A 11/07/2021   Procedure: INTRAVASCULAR PRESSURE WIRE/FFR STUDY;  Surgeon: Orbie Pyo, MD;  Location: [facility] INVASIVE CV LAB;  Service: Cardiovascular;  Laterality: N/A;   CORONARY STENT INTERVENTION N/A 10/29/2021   Procedure: CORONARY STENT INTERVENTION;  Surgeon: Swaziland, Peter M, MD;  Location: [facility] INVASIVE CV LAB;  Service: Cardiovascular;  Laterality: N/A;  Mid  LAD   CYSTOSCOPY WITH RETROGRADE PYELOGRAM, URETEROSCOPY AND STENT PLACEMENT Right 07/31/2016   Procedure: CYSTOSCOPY WITH RIGHT  RETROGRADE PYELOGRAM, URETEROSCOPY;  Surgeon: Crist Fat, MD;  Location: [facility] ORS;  Service: Urology;  Laterality: Right;  INGUINAL HERNIA REPAIR     Left ankle joint fusion  1981   LEFT HEART CATH AND CORONARY ANGIOGRAPHY N/A 10/29/2021   Procedure: LEFT HEART CATH AND CORONARY ANGIOGRAPHY;  Surgeon: Swaziland, Peter M, MD;  Location: [facility] INVASIVE CV LAB;  Service: Cardiovascular;  Laterality: N/A;   LEFT HEART CATH AND CORONARY ANGIOGRAPHY N/A 11/07/2021   Procedure: LEFT HEART CATH AND CORONARY ANGIOGRAPHY;  Surgeon: Orbie Pyo, MD;  Location: [facility] INVASIVE CV LAB;  Service: Cardiovascular;  Laterality: N/A;   PROSTATE BIOPSY     x 2   URETERAL REIMPLANTION  07/31/2016   Procedure: URETERAL REIMPLANT, right boari flap, right psoas hitch;  Surgeon: Crist Fat, MD;  Location: [facility] ORS;  Service: Urology;;     Social History:   reports that he has never smoked. He has never used smokeless tobacco. He reports that he does not currently use alcohol. He reports that he does not use drugs.   Family History:  His family history includes Diabetes in an other family member; Heart disease in his father; Lung cancer in his maternal uncle and mother; Pancreatic cancer in his father; Throat cancer in his brother.   Allergies Allergies  Allergen Reactions   Haldol [Haloperidol] Other (See Comments)    Per family cause life threatening complications to patient Per Pt, it causes "complications with my mouth"    The patient is critically ill with multiple organ systems failure and requires high complexity decision making for assessment and support, frequent evaluation and titration of therapies, application of advanced monitoring technologies and extensive interpretation of multiple databases. Critical Care Time devoted to patient care services described in this  note independent of APP/resident time (if applicable)  is 32 minutes.   Virl Diamond MD [facility] Personal pager: See Amion If unanswered, please page CCM On-call: [phone]

## 2023-02-16 NOTE — Plan of Care (Signed)

## 2023-02-16 NOTE — Progress Notes (Signed)
Verbal order for AM labs from Dr Marcelline Deist

## 2023-02-16 DEATH — deceased

## 2023-02-17 ENCOUNTER — Inpatient Hospital Stay (HOSPITAL_COMMUNITY): Payer: 59

## 2023-02-17 DIAGNOSIS — J9601 Acute respiratory failure with hypoxia: Secondary | ICD-10-CM | POA: Diagnosis not present

## 2023-02-17 DIAGNOSIS — Z2239 Carrier of other specified bacterial diseases: Secondary | ICD-10-CM | POA: Diagnosis not present

## 2023-02-17 DIAGNOSIS — A419 Sepsis, unspecified organism: Secondary | ICD-10-CM | POA: Diagnosis not present

## 2023-02-17 DIAGNOSIS — A0472 Enterocolitis due to Clostridium difficile, not specified as recurrent: Secondary | ICD-10-CM | POA: Diagnosis not present

## 2023-02-17 DIAGNOSIS — E876 Hypokalemia: Secondary | ICD-10-CM

## 2023-02-17 LAB — CBC WITH DIFFERENTIAL/PLATELET
Abs Immature Granulocytes: 0.1 10*3/uL — ABNORMAL HIGH (ref 0.00–0.07)
Basophils Absolute: 0 10*3/uL (ref 0.0–0.1)
Basophils Relative: 0 %
Eosinophils Absolute: 0 10*3/uL (ref 0.0–0.5)
Eosinophils Relative: 1 %
HCT: 28.8 % — ABNORMAL LOW (ref 39.0–52.0)
Hemoglobin: 9.6 g/dL — ABNORMAL LOW (ref 13.0–17.0)
Lymphocytes Relative: 19 %
Lymphs Abs: 0.7 10*3/uL (ref 0.7–4.0)
MCH: 28.7 pg (ref 26.0–34.0)
MCHC: 33.3 g/dL (ref 30.0–36.0)
MCV: 86.2 fL (ref 80.0–100.0)
Monocytes Absolute: 0.7 10*3/uL (ref 0.1–1.0)
Monocytes Relative: 17 %
Myelocytes: 2 %
Neutro Abs: 2.4 10*3/uL (ref 1.7–7.7)
Neutrophils Relative %: 61 %
Platelets: 86 10*3/uL — ABNORMAL LOW (ref 150–400)
RBC: 3.34 MIL/uL — ABNORMAL LOW (ref 4.22–5.81)
RDW: 19.2 % — ABNORMAL HIGH (ref 11.5–15.5)
WBC: 3.9 10*3/uL — ABNORMAL LOW (ref 4.0–10.5)
nRBC: 1 % — ABNORMAL HIGH (ref 0.0–0.2)

## 2023-02-17 LAB — GLUCOSE, CAPILLARY
Glucose-Capillary: 147 mg/dL — ABNORMAL HIGH (ref 70–99)
Glucose-Capillary: 166 mg/dL — ABNORMAL HIGH (ref 70–99)
Glucose-Capillary: 186 mg/dL — ABNORMAL HIGH (ref 70–99)
Glucose-Capillary: 189 mg/dL — ABNORMAL HIGH (ref 70–99)
Glucose-Capillary: 207 mg/dL — ABNORMAL HIGH (ref 70–99)
Glucose-Capillary: 211 mg/dL — ABNORMAL HIGH (ref 70–99)

## 2023-02-17 LAB — COMPREHENSIVE METABOLIC PANEL
ALT: 21 U/L (ref 0–44)
AST: 21 U/L (ref 15–41)
Albumin: 1.5 g/dL — ABNORMAL LOW (ref 3.5–5.0)
Alkaline Phosphatase: 308 U/L — ABNORMAL HIGH (ref 38–126)
Anion gap: 6 (ref 5–15)
BUN: 37 mg/dL — ABNORMAL HIGH (ref 8–23)
CO2: 19 mmol/L — ABNORMAL LOW (ref 22–32)
Calcium: 7.4 mg/dL — ABNORMAL LOW (ref 8.9–10.3)
Chloride: 107 mmol/L (ref 98–111)
Creatinine, Ser: 0.78 mg/dL (ref 0.61–1.24)
GFR, Estimated: >60 mL/min (ref >60)
Glucose, Bld: 178 mg/dL — ABNORMAL HIGH (ref 70–99)
Potassium: 3.3 mmol/L — ABNORMAL LOW (ref 3.5–5.1)
Sodium: 132 mmol/L — ABNORMAL LOW (ref 135–145)
Total Bilirubin: 0.6 mg/dL (ref <1.2)
Total Protein: 4.5 g/dL — ABNORMAL LOW (ref 6.5–8.1)

## 2023-02-17 LAB — BLOOD GAS, ARTERIAL
Acid-base deficit: 3.3 mmol/L — ABNORMAL HIGH (ref 0.0–2.0)
Bicarbonate: 19.9 mmol/L — ABNORMAL LOW (ref 20.0–28.0)
Drawn by: 51133
FIO2: 30 %
MECHVT: 510 mL
O2 Saturation: 99.3 %
PEEP: 5 cmH2O
Patient temperature: 37.3
RATE: 20 {breaths}/min
pCO2 arterial: 30 mm[Hg] — ABNORMAL LOW (ref 32–48)
pH, Arterial: 7.43 (ref 7.35–7.45)
pO2, Arterial: 95 mm[Hg] (ref 83–108)

## 2023-02-17 LAB — PHOSPHORUS: Phosphorus: 2.6 mg/dL (ref 2.5–4.6)

## 2023-02-17 LAB — AMMONIA: Ammonia: 25 umol/L (ref 9–35)

## 2023-02-17 LAB — TRIGLYCERIDES: Triglycerides: 269 mg/dL — ABNORMAL HIGH (ref <150)

## 2023-02-17 MED ORDER — AMOXICILLIN 400 MG/5ML PO SUSR
500.0000 mg | Freq: Three times a day (TID) | ORAL | Status: DC
  Administered 2023-02-17 – 2023-02-18 (×4): 500 mg
  Filled 2023-02-17 (×6): qty 10

## 2023-02-17 MED ORDER — POTASSIUM CHLORIDE 20 MEQ PO PACK
40.0000 meq | PACK | Freq: Once | ORAL | Status: AC
  Administered 2023-02-17: 40 meq
  Filled 2023-02-17: qty 2

## 2023-02-17 NOTE — Progress Notes (Signed)
ABG walked down to lab

## 2023-02-17 NOTE — Progress Notes (Signed)
NAME:  [name], MRN:  [id number], DOB:  28-Jun-1955, LOS: 7 ADMISSION DATE:  02/10/2023, CONSULTATION DATE:  11/25 REFERRING MD:  Dr. Rhunette Croft, CHIEF COMPLAINT:  acute respiratory failure w/ hypoxia; encephalopathy   History of Present Illness:  Patient is a 67 yo M w/ pertinent PMH dementia, IDDM-2, HTN, HLD and mood disorder presents to [facility] ED on 11/25 w/ ams.  Patient recently admitted last month w/ confusion/lethargy. Found to have severe sepiss from staph aureus uti. Discharged back to SNF. On 11/25 patient w/ AMS. On ems arrival patient sob and unresponsive. Sats 84% and started on supplemental O2. Transported to [facility] ED.  On arrival to [facility] ED on 11/25, patient remains altered. Febrile 100.5 and tachycardic 120s. BP stable. LA 3.8 and given IV fluids. Placed on NRB w/ sats low 90s. CBG 51 given dextrose. Cultures obtained and started on broad spectrum abx. Vbg 7.38, 41, 31, 24. CXR and CT head w/ no significant findings. Patient having diarrhea while in ED. CT abd/pelvis showing dense LLL consolidation and patchy opacities in Right lung base suspicious for pna; mild wall thickening of descending colon/sigmoid colon possible colitis; stable chronic right sided hydronephrosis. Covid/flu negative. UA w/ large leukocytes and few bacteria. Cdif antigen showing positive antigen and negative toxin; PCR pending. Patient remained encephalopathic and BP remains soft despite IV fluids. On HHFNC 80% fio2. ED plans to intubate given increase in o2 needs and poor mental status. PCCM consulted for icu admission.  Pertinent ED labs: creat 3.72, ast 58, alk phosph 204, AG 17, ammonia 18   Pertinent  Medical History   has a past medical history of Acute kidney injury superimposed on chronic kidney disease (HCC) (02/10/2020), Acute on chronic kidney failure (HCC) (09/28/2019), Closed nondisplaced spiral fracture of shaft of right tibia, Community acquired pneumonia (03/24/2021), Complicated urinary tract  infection (09/06/2016), Depression, Diabetes mellitus without complication (HCC), GERD (gastroesophageal reflux disease), Gout, Hemorrhoids, Hypertension, Macular degeneration, Microcytic anemia, PNA (pneumonia) (03/26/2021), Prostate cancer (HCC) (08/2009), and Tubular adenoma.   Significant Hospital Events: Including procedures, antibiotic start and stop dates in addition to other pertinent events   11/25 admitted w/ ams and respiratory failure w/ hypoxia 11/26 EEG with moderate to severe diffuse encephalopathy, no seizures or epileptiform discharges 11/27 MRI with chronic microvascular changes, no acute stroke 12/1 tolerating weaning pressure support 10/5  Interim History / Subjective:  NAEON. Remains encephalopathic. Had MRI brain 11/27 that was negative but did mention a possibility of NPH  Objective   Blood pressure 125/80, pulse 96, temperature 99.1 F (37.3 C), temperature source Bladder, resp. rate (!) 21, height 5\' 6"  (1.676 m), weight 73.9 kg, SpO2 93%.    Vent Mode: PRVC FiO2 (%):  [30 %] 30 % Set Rate:  [20 bmp] 20 bmp Vt Set:  [510 mL] 510 mL PEEP:  [5 cmH20] 5 cmH20 Pressure Support:  [10 cmH20] 10 cmH20 Plateau Pressure:  [14 cmH20-21 cmH20] 18 cmH20   Intake/Output Summary (Last 24 hours) at 02/17/2023 0853 Last data filed at 02/17/2023 2952 Gross per 24 hour  Intake 3327.98 ml  Output 1720 ml  Net 1607.98 ml   Filed Weights   02/15/23 0600 02/16/23 0600 02/17/23 0500  Weight: 71.6 kg 72.6 kg 73.9 kg    Examination: General: Elderly, chronically ill-appearing male HEENT: [place]/AT. MMM. ETT in place Neuro: Not responsive despite no sedation CV: RRR, no M/R/G PULM: Normal effort, CTAB GI: Bowel sounds appreciated Extremities: No acute deformity Skin: no rashes or lesions  Resolved Hospital Problem list   Septic shock Transaminitis  Assessment & Plan:   Acute hypoxemic respiratory failure with inability to protect the airway -Continue mechanical  ventilation  -Mental status precludes extubation consideration -Target TVol 6-8cc/kgIBW -Target Plateau Pressure < 30cm H20 -Target driving pressure less than 15 cm of water -Target PaO2 55-65: titrate PEEP/FiO2 per protocol -Ventilator associated pneumonia prevention protocol  C. difficile colitis  - Continue Flagyl and vancomycin  MRSA and VRE UTI - Continue Vanc - Add Ampicillin x 5 days  Metabolic encephalopathy - despite no sedation. Unclear etiology at this point, could be sepsis alone?  EEG 11/26 and MRI 11/27 negative though MRI does mention possibility of NPH. Ammonia and TSH normal - Repeat Ammonia. - Repeat brain MRI now - Might need to get neuro involved - Neurostimulant started as a trial attempt 12/1, continue (Methylphenidate 5 mg per tube) -Can be increased to twice daily dosing if benefit  IDDM -Continue SSI  Hypokalemia Hypophosphatemia - repleted 12/1 - K x 1 - Check Phos -Trend BMP  Pancytopenia - Likely in the context of sepsis -Continue supportive care/monitoring  History of urinary retention/hydronephrosis -Continue Foley History of mood disorder/dementia -Continue PTA Namenda -PTA Depakote, sertraline and Zyprexa on hold   Best Practice (right click and "Reselect all SmartList Selections" daily)   Diet/type: tubefeeds DVT prophylaxis: SCDs Start: 02/10/23 1427  Pressure ulcer(s): not present on admission  GI prophylaxis: H2B Lines: N/A Foley:  Yes, and it is still needed Code Status:  DNR; Tonia states DNR but okay with intubation. Believes would not want to be on vent long term. Last date of multidisciplinary goals of care discussion [11/26 patient's sister [name] updated over phone.     Rutherford Guys, PA - [facility] For pager details, please see AMION or use Epic chat  After 1900, please call [facility] for cross coverage needs 02/17/2023, 9:19 AM

## 2023-02-17 NOTE — Plan of Care (Signed)
  Problem: Clinical Measurements: Goal: Ability to maintain clinical measurements within normal limits will improve Outcome: Not Progressing Note: Uable to follow commands. UTA orientation at this time. Vented   Problem: Elimination: Goal: Will not experience complications related to bowel motility Outcome: Not Progressing Note: Diarrhea   Problem: Education: Goal: Ability to describe self-care measures that may prevent or decrease complications (Diabetes Survival Skills Education) will improve Outcome: Not Progressing Goal: Individualized Educational Video(s) Outcome: Not Progressing   Problem: Health Behavior/Discharge Planning: Goal: Ability to identify and utilize available resources and services will improve Outcome: Not Progressing Goal: Ability to manage health-related needs will improve Outcome: Not Progressing

## 2023-02-18 ENCOUNTER — Inpatient Hospital Stay (HOSPITAL_COMMUNITY): Payer: 59

## 2023-02-18 ENCOUNTER — Inpatient Hospital Stay (HOSPITAL_COMMUNITY)
Admit: 2023-02-18 | Discharge: 2023-02-18 | Disposition: A | Discharge status: Home / Self Care | Payer: 59 | Attending: Internal Medicine

## 2023-02-18 DIAGNOSIS — G9341 Metabolic encephalopathy: Secondary | ICD-10-CM | POA: Diagnosis not present

## 2023-02-18 DIAGNOSIS — A0472 Enterocolitis due to Clostridium difficile, not specified as recurrent: Secondary | ICD-10-CM | POA: Diagnosis not present

## 2023-02-18 DIAGNOSIS — J9601 Acute respiratory failure with hypoxia: Secondary | ICD-10-CM | POA: Diagnosis not present

## 2023-02-18 LAB — GLUCOSE, CAPILLARY
Glucose-Capillary: 169 mg/dL — ABNORMAL HIGH (ref 70–99)
Glucose-Capillary: 204 mg/dL — ABNORMAL HIGH (ref 70–99)
Glucose-Capillary: 222 mg/dL — ABNORMAL HIGH (ref 70–99)
Glucose-Capillary: 180 mg/dL — ABNORMAL HIGH (ref 70–99)
Glucose-Capillary: 131 mg/dL — ABNORMAL HIGH (ref 70–99)
Glucose-Capillary: 148 mg/dL — ABNORMAL HIGH (ref 70–99)
Glucose-Capillary: 154 mg/dL — ABNORMAL HIGH (ref 70–99)

## 2023-02-18 LAB — CBC
HCT: 29.5 % — ABNORMAL LOW (ref 39.0–52.0)
Hemoglobin: 9.6 g/dL — ABNORMAL LOW (ref 13.0–17.0)
MCH: 28.7 pg (ref 26.0–34.0)
MCHC: 32.5 g/dL (ref 30.0–36.0)
MCV: 88.3 fL (ref 80.0–100.0)
Platelets: 121 10*3/uL — ABNORMAL LOW (ref 150–400)
RBC: 3.34 MIL/uL — ABNORMAL LOW (ref 4.22–5.81)
RDW: 18.9 % — ABNORMAL HIGH (ref 11.5–15.5)
WBC: 5.1 10*3/uL (ref 4.0–10.5)
nRBC: 0 % (ref 0.0–0.2)

## 2023-02-18 LAB — BASIC METABOLIC PANEL
Anion gap: 6 (ref 5–15)
BUN: 34 mg/dL — ABNORMAL HIGH (ref 8–23)
CO2: 20 mmol/L — ABNORMAL LOW (ref 22–32)
Calcium: 7.7 mg/dL — ABNORMAL LOW (ref 8.9–10.3)
Chloride: 109 mmol/L (ref 98–111)
Creatinine, Ser: 0.59 mg/dL — ABNORMAL LOW (ref 0.61–1.24)
GFR, Estimated: >60 mL/min (ref >60)
Glucose, Bld: 210 mg/dL — ABNORMAL HIGH (ref 70–99)
Potassium: 3.6 mmol/L (ref 3.5–5.1)
Sodium: 135 mmol/L (ref 135–145)

## 2023-02-18 LAB — TSH: TSH: 1.893 u[IU]/mL (ref 0.350–4.500)

## 2023-02-18 LAB — CK: Total CK: 25 U/L — ABNORMAL LOW (ref 49–397)

## 2023-02-18 LAB — HIV ANTIBODY (ROUTINE TESTING W REFLEX): HIV Screen 4th Generation wRfx: NONREACTIVE

## 2023-02-18 LAB — CSF CELL COUNT WITH DIFFERENTIAL
RBC Count, CSF: 375 /mm3 — ABNORMAL HIGH
RBC Count, CSF: 7 /mm3 — ABNORMAL HIGH
Tube #: 1
Tube #: 4
WBC, CSF: 0 /mm3 (ref 0–5)
WBC, CSF: 0 /mm3 (ref 0–5)

## 2023-02-18 LAB — PHOSPHORUS: Phosphorus: 2.4 mg/dL — ABNORMAL LOW (ref 2.5–4.6)

## 2023-02-18 LAB — PROTEIN AND GLUCOSE, CSF
Glucose, CSF: 116 mg/dL — ABNORMAL HIGH (ref 40–70)
Total  Protein, CSF: 19 mg/dL (ref 15–45)

## 2023-02-18 MED ORDER — DIVALPROEX SODIUM 125 MG PO CSDR
250.0000 mg | DELAYED_RELEASE_CAPSULE | Freq: Two times a day (BID) | ORAL | Status: DC
  Filled 2023-02-18: qty 2

## 2023-02-18 MED ORDER — VALPROIC ACID 250 MG/5ML PO SOLN
250.0000 mg | Freq: Two times a day (BID) | ORAL | Status: DC
  Administered 2023-02-18 (×2): 250 mg
  Filled 2023-02-18 (×3): qty 5

## 2023-02-18 MED ORDER — POTASSIUM PHOSPHATES 15 MMOLE/5ML IV SOLN
30.0000 mmol | Freq: Once | INTRAVENOUS | Status: AC
  Administered 2023-02-18: 30 mmol via INTRAVENOUS
  Filled 2023-02-18: qty 10

## 2023-02-18 MED ORDER — SODIUM CHLORIDE 0.9 % IV SOLN
2.0000 g | INTRAVENOUS | Status: DC
  Administered 2023-02-18 – 2023-02-19 (×4): 2 g via INTRAVENOUS
  Filled 2023-02-18 (×6): qty 2000

## 2023-02-18 MED ORDER — SODIUM CHLORIDE 0.9 % IV SOLN
2.0000 g | Freq: Two times a day (BID) | INTRAVENOUS | Status: DC
  Administered 2023-02-18: 2 g via INTRAVENOUS
  Filled 2023-02-18 (×2): qty 20

## 2023-02-18 MED ORDER — VANCOMYCIN HCL IN DEXTROSE 1-5 GM/200ML-% IV SOLN
1000.0000 mg | Freq: Two times a day (BID) | INTRAVENOUS | Status: DC
  Administered 2023-02-18 – 2023-02-19 (×2): 1000 mg via INTRAVENOUS
  Filled 2023-02-18 (×2): qty 200

## 2023-02-18 MED ORDER — METHYLPHENIDATE HCL 5 MG PO TABS
5.0000 mg | ORAL_TABLET | Freq: Two times a day (BID) | ORAL | Status: DC
  Filled 2023-02-18: qty 1

## 2023-02-18 MED ORDER — SODIUM CHLORIDE 0.9 % IV SOLN: INTRAVENOUS | Status: DC

## 2023-02-18 MED ORDER — DEXTROSE 5 % IV SOLN
10.0000 mg/kg | Freq: Three times a day (TID) | INTRAVENOUS | Status: DC
  Administered 2023-02-18 – 2023-02-19 (×2): 750 mg via INTRAVENOUS
  Filled 2023-02-18 (×2): qty 15
  Filled 2023-02-18: qty 10
  Filled 2023-02-18: qty 15

## 2023-02-18 MED ORDER — BETHANECHOL CHLORIDE 10 MG PO TABS
5.0000 mg | ORAL_TABLET | Freq: Three times a day (TID) | ORAL | Status: DC
  Administered 2023-02-18 (×3): 5 mg via ORAL
  Filled 2023-02-18 (×3): qty 1

## 2023-02-18 NOTE — Progress Notes (Signed)
At around 2200 last night, pt was transported to CT on vent at 100% fio2.  Pt tolerated the procedure well and the transport there and back to the ICU without incident.

## 2023-02-18 NOTE — Progress Notes (Signed)
NAME:  KELLYN RUDY, MRN:  [id number], DOB:  [birth year], LOS: 8 ADMISSION DATE:  02/10/2023, CONSULTATION DATE:  11/25 REFERRING MD:  Dr. Rhunette Croft, CHIEF COMPLAINT:  acute respiratory failure w/ hypoxia; encephalopathy   History of Present Illness:  Patient is a 67 yo M w/ pertinent PMH dementia, IDDM-2, HTN, HLD and mood disorder presents to [facility] ED on 11/25 w/ ams.  Patient recently admitted last month w/ confusion/lethargy. Found to have severe sepiss from staph aureus uti. Discharged back to SNF. On 11/25 patient w/ AMS. On ems arrival patient sob and unresponsive. Sats 84% and started on supplemental O2. Transported to [facility] ED.  On arrival to [facility] ED on 11/25, patient remains altered. Febrile 100.5 and tachycardic 120s. BP stable. LA 3.8 and given IV fluids. Placed on NRB w/ sats low 90s. CBG 51 given dextrose. Cultures obtained and started on broad spectrum abx. Vbg 7.38, 41, 31, 24. CXR and CT head w/ no significant findings. Patient having diarrhea while in ED. CT abd/pelvis showing dense LLL consolidation and patchy opacities in Right lung base suspicious for pna; mild wall thickening of descending colon/sigmoid colon possible colitis; stable chronic right sided hydronephrosis. Covid/flu negative. UA w/ large leukocytes and few bacteria. Cdif antigen showing positive antigen and negative toxin; PCR pending. Patient remained encephalopathic and BP remains soft despite IV fluids. On HHFNC 80% fio2. ED plans to intubate given increase in o2 needs and poor mental status. PCCM consulted for icu admission.  Pertinent ED labs: creat 3.72, ast 58, alk phosph 204, AG 17, ammonia 18   Pertinent  Medical History   has a past medical history of Acute kidney injury superimposed on chronic kidney disease (HCC) (02/10/2020), Acute on chronic kidney failure (HCC) (09/28/2019), Closed nondisplaced spiral fracture of shaft of right tibia, Community acquired pneumonia (03/24/2021), Complicated urinary tract  infection (09/06/2016), Depression, Diabetes mellitus without complication (HCC), GERD (gastroesophageal reflux disease), Gout, Hemorrhoids, Hypertension, Macular degeneration, Microcytic anemia, PNA (pneumonia) (03/26/2021), Prostate cancer (HCC) (08/2009), and Tubular adenoma.   Significant Hospital Events: Including procedures, antibiotic start and stop dates in addition to other pertinent events   11/25 admitted w/ ams and respiratory failure w/ hypoxia 11/26 EEG with moderate to severe diffuse encephalopathy, no seizures or epileptiform discharges 11/27 MRI with chronic microvascular changes, no acute stroke 12/1 tolerating weaning pressure support 10/5 12/2 MRI brain and Cspine > no acute process, advanced atrophy, slightly worsened moderate right C4-5 neural foraminal stenosis due to facet arthrosis  Interim History / Subjective:  NAEON. Remains minimally responsive. MRI brain and C-spine not really revealing. Neuro consult pending today.  Objective   Blood pressure (!) 108/57, pulse 93, temperature 97.8 F (36.6 C), temperature source Axillary, resp. rate (!) 21, height 5\' 6"  (1.676 m), weight 75 kg, SpO2 95%.    Vent Mode: PRVC FiO2 (%):  [30 %] 30 % Set Rate:  [20 bmp] 20 bmp Vt Set:  [510 mL] 510 mL PEEP:  [5 cmH20] 5 cmH20 Plateau Pressure:  [15 cmH20-21 cmH20] 15 cmH20   Intake/Output Summary (Last 24 hours) at 02/18/2023 0733 Last data filed at 02/18/2023 0411 Gross per 24 hour  Intake 1722.62 ml  Output 1310 ml  Net 412.62 ml   Filed Weights   02/16/23 0600 02/17/23 0500 02/18/23 0321  Weight: 72.6 kg 73.9 kg 75 kg    Examination: General: Elderly, chronically ill-appearing male HEENT: [place]/AT. MMM. ETT in place Neuro: Not responsive despite no sedation, eyes open. Maybe slight flicker of right  hand to pain but not much CV: RRR, no M/R/G PULM: Normal effort, CTAB GI: Bowel sounds appreciated Extremities: No acute deformity Skin: no rashes or lesions    Resolved Hospital Problem list   Septic shock Transaminitis  Assessment & Plan:   Acute hypoxemic respiratory failure with inability to protect the airway - Continue mechanical ventilation - SBT this AM, he is triggering vent - Will consider extubation this AM - Will need cortrak placement prior to extubation as doubt he'll be able to swallow - Push bronchial hygiene  C. difficile colitis  - Continue Flagyl and vancomycin  MRSA and VRE UTI - Continue Vanc - Add Amoxicillin x 5 days (stop date 12/6)  Metabolic encephalopathy - despite no sedation. Unclear etiology at this point, could be sepsis alone?  EEG 11/26 and MRI 11/27 and MRI repeat 12/2 negative though MRI 11/27 did mention possibility of NPH (not mentioned on repeat). Ammonia and TSH normal - Neuro consulted afternoon 12/2, likely to see today - Neurostimulant started as a trial attempt 12/1, continue (Methylphenidate 5 mg per tube, increase to BID)  IDDM - Continue SSI  Hypophosphatemia - Kphos x 1 - Trend BMP  Pancytopenia - Stable. Likely in the context of sepsis - Continue supportive care/monitoring  History of urinary retention/hydronephrosis - Continue external foley with in and out for retention > 300-400cc retrained - Try Urecholine  History of mood disorder/dementia -Continue PTA Namenda - Restart PTA Depakote - PTA Sertraline and Zyprexa on hold   Best Practice (right click and "Reselect all SmartList Selections" daily)   Diet/type: tubefeeds DVT prophylaxis: SCDs Start: 02/10/23 1427  Pressure ulcer(s): not present on admission  GI prophylaxis: H2B Lines: N/A Foley:  Yes, and it is still needed Code Status:  DNR; Tonia states DNR but okay with intubation. Believes would not want to be on vent long term. Last date of multidisciplinary goals of care discussion [11/26 patient's sister [name] updated over phone.   CC time: 30 min.  Rutherford Guys, PA - [facility] For pager details, please see AMION or use Epic chat  After 1900, please call [facility] for cross coverage needs 02/18/2023, 7:33 AM

## 2023-02-18 NOTE — Progress Notes (Addendum)
eLink Physician-Brief Progress Note Patient Name: [name] DOB: 02-07-1956 MRN: [id number]   Date of Service  02/18/2023  HPI/Events of Note  67 year old with polymicrobial infection with new potential meningitis starting acyclovir.  Pharmacy recommendation to initiate some maintenance fluids for AKI prophylaxis.  Patient clinically noted to have some anasarca.  eICU Interventions  Add liter of saline daily as prophylaxis.   2043 - Have received 2 requests by bedside staff to change meds from internal to IV.  The patient is on numerous medications scheduled via the enteral tube which is in place. The tube terminates in the stomach.  No clear indication to change route from enteral to IV.  Intervention Category Minor Interventions: Routine modifications to care plan (e.g. PRN medications for pain, fever)  Jenya Putz 02/18/2023, 8:45 PM

## 2023-02-18 NOTE — Consult Note (Signed)
NEUROLOGY CONSULT NOTE   Date of service: February 18, 2023 Patient Name: [name] MRN:  [id number] DOB:  09-Jun-1955 Chief Complaint: Patient is unable to provide chief complaint due to his mental status, acutely encephalopathic, intubated in the ICU Requesting Provider: Lorin Glass, MD  History of Present Illness  [name] is a 67 y.o. adult  has a past medical history of Acute kidney injury superimposed on chronic kidney disease (HCC) (02/10/2020), Acute on chronic kidney failure (HCC) (09/28/2019), Closed nondisplaced spiral fracture of shaft of right tibia, Community acquired pneumonia (03/24/2021), Complicated urinary tract infection (09/06/2016), Depression, Diabetes mellitus without complication (HCC), GERD (gastroesophageal reflux disease), Gout, Hemorrhoids, Hypertension, Macular degeneration, Microcytic anemia, PNA (pneumonia) (03/26/2021), Prostate cancer (HCC) (08/2009), and Tubular adenoma.  Patient presents to [facility] with altered mental status, shortness of breath, unresponsive, hypoxic with an SpO2 of 84% on supplemental oxygen, febrile to 100.5, tachycardia with rate 110-120's, lactic acidosis of 3.8, and initial work up revealed concern for sepsis, Cdiff, pneumonia, possible colitis, UA with large leukocytes and few bacteria, and ongoing encephalopathy.  Patient was admitted and has since required intubation with further complications of ongoing altered mental status despite treatment with Vancomycin and Flagyl, transaminitis, an unremarkable MRI brain, cervical spine, and no seizures on EEG.    Of note, approximately one month prior to his current presentation, patient was admitted for evaluation of confusion and lethargy.  At this time, patient was found to have severe sepsis from Staph aureus UTI s/p treatment and discharged back to SNF.  ROS  Unable to ascertain due to patient's condition, intubated and encephalopathic in the ICU.  Past History   Past Medical  History:  Diagnosis Date   Acute kidney injury superimposed on chronic kidney disease (HCC) 02/10/2020   Acute on chronic kidney failure (HCC) 09/28/2019   Closed nondisplaced spiral fracture of shaft of right tibia    Community acquired pneumonia 03/24/2021   Complicated urinary tract infection 09/06/2016   Depression    Diabetes mellitus without complication (HCC)    GERD (gastroesophageal reflux disease)    Gout    right foot   Hemorrhoids    Hypertension    Macular degeneration    Microcytic anemia    PNA (pneumonia) 03/26/2021   Prostate cancer (HCC) 08/2009   Tubular adenoma    Past Surgical History:  Procedure Laterality Date   CORONARY PRESSURE/FFR STUDY N/A 11/07/2021   Procedure: INTRAVASCULAR PRESSURE WIRE/FFR STUDY;  Surgeon: Orbie Pyo, MD;  Location: [facility] INVASIVE CV LAB;  Service: Cardiovascular;  Laterality: N/A;   CORONARY STENT INTERVENTION N/A 10/29/2021   Procedure: CORONARY STENT INTERVENTION;  Surgeon: Swaziland, Peter M, MD;  Location: [facility] INVASIVE CV LAB;  Service: Cardiovascular;  Laterality: N/A;  Mid LAD   CYSTOSCOPY WITH RETROGRADE PYELOGRAM, URETEROSCOPY AND STENT PLACEMENT Right 07/31/2016   Procedure: CYSTOSCOPY WITH RIGHT  RETROGRADE PYELOGRAM, URETEROSCOPY;  Surgeon: Crist Fat, MD;  Location: [facility];  Service: Urology;  Laterality: Right;   INGUINAL HERNIA REPAIR     Left ankle joint fusion  1981   LEFT HEART CATH AND CORONARY ANGIOGRAPHY N/A 10/29/2021   Procedure: LEFT HEART CATH AND CORONARY ANGIOGRAPHY;  Surgeon: Swaziland, Peter M, MD;  Location: [facility] INVASIVE CV LAB;  Service: Cardiovascular;  Laterality: N/A;   LEFT HEART CATH AND CORONARY ANGIOGRAPHY N/A 11/07/2021   Procedure: LEFT HEART CATH AND CORONARY ANGIOGRAPHY;  Surgeon: Orbie Pyo, MD;  Location: [facility] INVASIVE CV LAB;  Service: Cardiovascular;  Laterality: N/A;   PROSTATE BIOPSY     x 2   URETERAL REIMPLANTION  07/31/2016   Procedure: URETERAL REIMPLANT, right boari flap, right  psoas hitch;  Surgeon: Crist Fat, MD;  Location: [facility];  Service: Urology;;   Family History: Family History  Problem Relation Age of Onset   Lung cancer Mother        Deceased   Pancreatic cancer Father    Heart disease Father        Deceased   Throat cancer Brother    Lung cancer Maternal Uncle        nephew   Diabetes Other    Social History  reports that he has never smoked. He has never used smokeless tobacco. He reports that he does not currently use alcohol. He reports that he does not use drugs.  Allergies  Allergen Reactions   Haldol [Haloperidol] Other (See Comments)    Per family cause life threatening complications to patient Per Pt, it causes "complications with my mouth"   Medications   Current Facility-Administered Medications:    amoxicillin (AMOXIL) 400 MG/5ML suspension 500 mg, 500 mg, Per Tube, Q8H, Desai, Rahul P, PA-C, 500 mg at 02/18/23 0548   aspirin chewable tablet 81 mg, 81 mg, Per Tube, Daily, Lidia Collum, PA-C, 81 mg at 02/18/23 1018   bethanechol (URECHOLINE) tablet 5 mg, 5 mg, Oral, TID, Desai, Rahul P, PA-C   Chlorhexidine Gluconate Cloth 2 % PADS 6 each, 6 each, Topical, Daily, Luciano Cutter, MD, 6 each at 02/17/23 1300   clopidogrel (PLAVIX) tablet 75 mg, 75 mg, Per Tube, Daily, Lidia Collum, PA-C, 75 mg at 02/18/23 1018   docusate sodium (COLACE) capsule 100 mg, 100 mg, Oral, BID PRN, Lidia Collum, PA-C   famotidine (PEPCID) tablet 20 mg, 20 mg, Per Tube, BID, Pia Mau D, PA-C, 20 mg at 02/18/23 1018   feeding supplement (VITAL AF 1.2 CAL) liquid 1,000 mL, 1,000 mL, Per Tube, Q24H, Olalere, Adewale A, MD, Infusion Verify at 02/18/23 0118   free water 200 mL, 200 mL, Per Tube, Q4H, Pia Mau D, PA-C, 200 mL at 02/18/23 0837   insulin aspart (novoLOG) injection 0-20 Units, 0-20 Units, Subcutaneous, Q4H, Olalere, Adewale A, MD, 7 Units at 02/18/23 0837   memantine (NAMENDA) tablet 5 mg, 5 mg, Per Tube, BID, Pia Mau D,  PA-C, 5 mg at 02/18/23 1018   methylphenidate (RITALIN) tablet 5 mg, 5 mg, Oral, BID WC, Desai, Rahul P, PA-C   metroNIDAZOLE (FLAGYL) IVPB 500 mg, 500 mg, Intravenous, Q8H, Payne, John D, PA-C, Last Rate: 100 mL/hr at 02/18/23 0951, 500 mg at 02/18/23 0951   Oral care mouth rinse, 15 mL, Mouth Rinse, Q2H, Payne, John D, PA-C, 15 mL at 02/18/23 1018   Oral care mouth rinse, 15 mL, Mouth Rinse, PRN, Pia Mau D, PA-C   polyethylene glycol (MIRALAX / GLYCOLAX) packet 17 g, 17 g, Oral, Daily PRN, Suzie Portela, John D, PA-C   potassium PHOSPHATE 30 mmol in dextrose 5 % 500 mL infusion, 30 mmol, Intravenous, Once, Desai, Rahul P, PA-C, Last Rate: 85 mL/hr at 02/18/23 1013, 30 mmol at 02/18/23 1013   sodium chloride flush (NS) 0.9 % injection 10-40 mL, 10-40 mL, Intracatheter, Q12H, Olalere, Adewale A, MD, 10 mL at 02/18/23 1019   sodium chloride flush (NS) 0.9 % injection 10-40 mL, 10-40 mL, Intracatheter, PRN, Olalere, Adewale A, MD   [COMPLETED] thiamine (VITAMIN B1) 500 mg in sodium chloride 0.9 %  50 mL IVPB, 500 mg, Intravenous, BID, Stopped at 02/15/23 2300 **FOLLOWED BY** thiamine (VITAMIN B1) 250 mg in sodium chloride 0.9 % 50 mL IVPB, 250 mg, Intravenous, BID, Duayne Cal, NP, Stopped at 02/18/23 0055   valproic acid (DEPAKENE) 250 MG/5ML solution 250 mg, 250 mg, Per Tube, BID, Desai, Rahul P, PA-C   vancomycin (VANCOCIN) 50 mg/mL oral solution SOLN 500 mg, 500 mg, Per Tube, Q6H, Pia Mau D, PA-C, 500 mg at 02/18/23 0838  Vitals   Vitals:   02/18/23 0500 02/18/23 0505 02/18/23 0524 02/18/23 0741  BP: (!) 89/60 (!) 108/57    Pulse: 95 93 93   Resp: 20 20 (!) 21   Temp:    98.6 F (37 C)  TempSrc:    Axillary  SpO2: 95% 96% 95%   Weight:      Height:        Body mass index is 26.69 kg/m.  Physical Exam   Constitutional: Acutely ill appearing caucasian male laying in ICU bed Psych: Unable to assess due to patient's condition Eyes: No scleral injection, tearing present left eye >  right eye HENT: Oral ETT with securement device present at the right mouth Head: Normocephalic without obvious abnormality Cardiovascular: Normal rate and regular rhythm on telemetry Respiratory: Respirations assisted via mechanical ventilation, spontaneous breaths initiated by patient GI: Soft.  No distension. Patient does not grimace with palpation Skin: Anasarca   Neurologic Examination  Mental Status: Patient is laying in ICU bed, eyes open on examiner entrance to the room, ventilated and not on sedation. Patient does not fixate or track examiner throughout exam. Patient does not follow commands, does not nod or attempt answer questions. Patient is unable to speak due to oral ETT in place. Cranial Nerves: II: PERRL 4 mm and briskly reactive to light. Blink to threat inconsistent in the right eye, present throughout in the left eye.  III,IV, VI: Appears to have a slightly rightward gaze. Once during exam it seems that he attempts to track examiner with his eyes slightly towards the right but this is inconsistent and incomplete. Doll's eyes maneuver limited by limited ROM of patient's neck. Within that limitation, oculocephalic reflex appears intact.  V: Corneal reflex intact to eyelash brush.  VII: Facial symmetry evaluation limited by ETT and securement device. Patient does not grimace with application of noxious stimuli throughout VIII: Unable to assess patient's hearing as he does not respond to voice X/XI: Cough and gag reflexes intact XII: Does not protrude tongue to command.  Motor/Sensory: No spontaneous movement or response to noxious stimuli throughout except for slight flickering movement of the right hand to noxious stimuli of the digits.  Tone is markedly increased in bilateral upper extremities as well as left lower extremity Plantars: Toes are mute bilaterally.  Cerebellar: Does not perform  Labs/Imaging/Neurodiagnostic studies   CBC:  Recent Labs  Lab March 14, 2023 0651  02/17/23 0546 02/18/23 0415  WBC 2.9* 3.9* 5.1  NEUTROABS 1.6* 2.4  --   HGB 10.3* 9.6* 9.6*  HCT 30.7* 28.8* 29.5*  MCV 86.5 86.2 88.3  PLT 49* 86* 121*   Basic Metabolic Panel:  Lab Results  Component Value Date   NA 135 02/18/2023   K 3.6 02/18/2023   CO2 20 (L) 02/18/2023   GLUCOSE 210 (H) 02/18/2023   BUN 34 (H) 02/18/2023   CREATININE 0.59 (L) 02/18/2023   CALCIUM 7.7 (L) 02/18/2023   GFRNONAA >60 02/18/2023   GFRAA 48 (L) 03/13/2020   Lipid Panel:  Lab Results  Component Value Date   LDLCALC 94 10/29/2021   HgbA1c:  Lab Results  Component Value Date   HGBA1C 7.0 (H) 12/28/2022   Urine Drug Screen:     Component Value Date/Time   LABOPIA NONE DETECTED 09/02/2021 0439   COCAINSCRNUR NONE DETECTED 09/02/2021 0439   LABBENZ NONE DETECTED 09/02/2021 0439   AMPHETMU NONE DETECTED 09/02/2021 0439   THCU NONE DETECTED 09/02/2021 0439   LABBARB NONE DETECTED 09/02/2021 0439    Alcohol Level     Component Value Date/Time   ETH <10 02/17/2022 1857   INR  Lab Results  Component Value Date   INR 1.4 (H) 02/10/2023   APTT  Lab Results  Component Value Date   APTT 30 02/10/2023   AED levels: No results found for: "PHENYTOIN", "ZONISAMIDE", "LAMOTRIGINE", "LEVETIRACETA" C. Diff PCR positive Drugs of Abuse     Component Value Date/Time   LABOPIA NONE DETECTED 09/02/2021 0439   COCAINSCRNUR NONE DETECTED 09/02/2021 0439   LABBENZ NONE DETECTED 09/02/2021 0439   AMPHETMU NONE DETECTED 09/02/2021 0439   THCU NONE DETECTED 09/02/2021 0439   LABBARB NONE DETECTED 09/02/2021 0439    Results for orders placed or performed during the hospital encounter of 02/10/23  Blood Culture (routine x 2)     Status: None   Collection Time: 02/10/23  7:20 AM   Specimen: BLOOD  Result Value Ref Range Status   Specimen Description   Final    BLOOD RIGHT ANTECUBITAL Performed at [facility], [address], [place], [place] [address]    Special  Requests   Final    BOTTLES DRAWN AEROBIC AND ANAEROBIC Blood Culture adequate volume Performed at [facility], [address], [place], [place] [address]    Culture   Final    NO GROWTH 5 DAYS Performed at [facility], [address], [place], [place] [address]    Report Status 02/15/2023 FINAL  Final  Blood Culture (routine x 2)     Status: None   Collection Time: 02/10/23  7:43 AM   Specimen: BLOOD  Result Value Ref Range Status   Specimen Description   Final    BLOOD BLOOD RIGHT FOREARM Performed at [facility], [address], [place], [place] [address]    Special Requests   Final    BOTTLES DRAWN AEROBIC AND ANAEROBIC Blood Culture adequate volume Performed at [facility], [address] [place], [place] [address]    Culture   Final    NO GROWTH 5 DAYS Performed at [facility], [address] [place], [place] [address]    Report Status 02/15/2023 FINAL  Final  Urine Culture     Status: Abnormal   Collection Time: 02/10/23  8:35 AM   Specimen: Urine, Catheterized  Result Value Ref Range Status   Specimen Description   Final    URINE, CATHETERIZED Performed at [facility], [address] [place], [place] [address]    Special Requests   Final    NONE Performed at [facility], [address] [place], [place] [address]    Culture (A)  Final    50,000 COLONIES/mL ENTEROCOCCUS FAECALIS 30,000 COLONIES/mL METHICILLIN RESISTANT STAPHYLOCOCCUS AUREUS VANCOMYCIN RESISTANT ENTEROCOCCUS ISOLATED    Report Status 02/15/2023 FINAL  Final   Organism ID, Bacteria METHICILLIN RESISTANT STAPHYLOCOCCUS AUREUS (A)  Final   Organism ID, Bacteria ENTEROCOCCUS FAECALIS (A)  Final  Susceptibility   Enterococcus faecalis - MIC*    AMPICILLIN <=2 SENSITIVE Sensitive     NITROFURANTOIN <=16 SENSITIVE Sensitive     VANCOMYCIN >=32 RESISTANT Resistant     * 50,000  COLONIES/mL ENTEROCOCCUS FAECALIS   Methicillin resistant staphylococcus aureus - MIC*    CIPROFLOXACIN >=8 RESISTANT Resistant     GENTAMICIN <=0.5 SENSITIVE Sensitive     NITROFURANTOIN <=16 SENSITIVE Sensitive     OXACILLIN >=4 RESISTANT Resistant     TETRACYCLINE <=1 SENSITIVE Sensitive     VANCOMYCIN 1 SENSITIVE Sensitive     TRIMETH/SULFA <=10 SENSITIVE Sensitive     CLINDAMYCIN <=0.25 SENSITIVE Sensitive     RIFAMPIN <=0.5 SENSITIVE Sensitive     Inducible Clindamycin NEGATIVE Sensitive     LINEZOLID 2 SENSITIVE Sensitive     * 30,000 COLONIES/mL METHICILLIN RESISTANT STAPHYLOCOCCUS AUREUS  Resp panel by RT-PCR (RSV, Flu A&B, Covid) Anterior Nasal Swab     Status: None   Collection Time: 02/10/23  8:50 AM   Specimen: Anterior Nasal Swab  Result Value Ref Range Status   SARS Coronavirus 2 by RT PCR NEGATIVE NEGATIVE Final    Comment: (NOTE) SARS-CoV-2 target nucleic acids are NOT DETECTED.  The SARS-CoV-2 RNA is generally detectable in upper respiratory specimens during the acute phase of infection. The lowest concentration of SARS-CoV-2 viral copies this assay can detect is 138 copies/mL. A negative result does not preclude SARS-Cov-2 infection and should not be used as the sole basis for treatment or other patient management decisions. A negative result may occur with  improper specimen collection/handling, submission of specimen other than nasopharyngeal swab, presence of viral mutation(s) within the areas targeted by this assay, and inadequate number of viral copies(<138 copies/mL). A negative result must be combined with clinical observations, patient history, and epidemiological information. The expected result is Negative.  Fact Sheet for Patients:  BloggerCourse.com  Fact Sheet for Healthcare Providers:  SeriousBroker.it  This test is no t yet approved or cleared by the [place] FDA and  has been authorized  for detection and/or diagnosis of SARS-CoV-2 by FDA under an Emergency Use Authorization (EUA). This EUA will remain  in effect (meaning this test can be used) for the duration of the COVID-19 declaration under Section 564(b)(1) of the Act, 21 U.S.C.section 360bbb-3(b)(1), unless the authorization is terminated  or revoked sooner.       Influenza A by PCR NEGATIVE NEGATIVE Final   Influenza B by PCR NEGATIVE NEGATIVE Final    Comment: (NOTE) The Xpert Xpress SARS-CoV-2/FLU/RSV plus assay is intended as an aid in the diagnosis of influenza from Nasopharyngeal swab specimens and should not be used as a sole basis for treatment. Nasal washings and aspirates are unacceptable for Xpert Xpress SARS-CoV-2/FLU/RSV testing.  Fact Sheet for Patients: BloggerCourse.com  Fact Sheet for Healthcare Providers: SeriousBroker.it  This test is not yet approved or cleared by the [place] FDA and has been authorized for detection and/or diagnosis of SARS-CoV-2 by FDA under an Emergency Use Authorization (EUA). This EUA will remain in effect (meaning this test can be used) for the duration of the COVID-19 declaration under Section 564(b)(1) of the Act, 21 U.S.C. section 360bbb-3(b)(1), unless the authorization is terminated or revoked.     Resp Syncytial Virus by PCR NEGATIVE NEGATIVE Final    Comment: (NOTE) Fact Sheet for Patients: BloggerCourse.com  Fact Sheet for Healthcare Providers: SeriousBroker.it  This test is not yet approved or cleared by the [place] FDA and has been  authorized for detection and/or diagnosis of SARS-CoV-2 by FDA under an Emergency Use Authorization (EUA). This EUA will remain in effect (meaning this test can be used) for the duration of the COVID-19 declaration under Section 564(b)(1) of the Act, 21 U.S.C. section 360bbb-3(b)(1), unless the authorization is  terminated or revoked.  Performed at [facility], [address] [place], [place] [address]   Gastrointestinal Panel by PCR , Stool     Status: None   Collection Time: 02/10/23 11:30 AM   Specimen: STOOL  Result Value Ref Range Status   Campylobacter species NOT DETECTED NOT DETECTED Final   Plesimonas shigelloides NOT DETECTED NOT DETECTED Final   Salmonella species NOT DETECTED NOT DETECTED Final   Yersinia enterocolitica NOT DETECTED NOT DETECTED Final   Vibrio species NOT DETECTED NOT DETECTED Final   Vibrio cholerae NOT DETECTED NOT DETECTED Final   Enteroaggregative E coli (EAEC) NOT DETECTED NOT DETECTED Final   Enteropathogenic E coli (EPEC) NOT DETECTED NOT DETECTED Final   Enterotoxigenic E coli (ETEC) NOT DETECTED NOT DETECTED Final   Shiga like toxin producing E coli (STEC) NOT DETECTED NOT DETECTED Final   Shigella/Enteroinvasive E coli (EIEC) NOT DETECTED NOT DETECTED Final   Cryptosporidium NOT DETECTED NOT DETECTED Final   Cyclospora cayetanensis NOT DETECTED NOT DETECTED Final   Entamoeba histolytica NOT DETECTED NOT DETECTED Final   Giardia lamblia NOT DETECTED NOT DETECTED Final   Adenovirus F40/41 NOT DETECTED NOT DETECTED Final   Astrovirus NOT DETECTED NOT DETECTED Final   Norovirus GI/GII NOT DETECTED NOT DETECTED Final   Rotavirus A NOT DETECTED NOT DETECTED Final   Sapovirus (I, II, IV, and V) NOT DETECTED NOT DETECTED Final    Comment: Performed at [facility], [address] [place], [place] [address]  C Difficile Quick Screen w PCR reflex     Status: Abnormal   Collection Time: 02/10/23 11:30 AM   Specimen: STOOL  Result Value Ref Range Status   C Diff antigen POSITIVE (A) NEGATIVE Final   C Diff toxin NEGATIVE NEGATIVE Final   C Diff interpretation Results are indeterminate. See PCR results.  Final    Comment: Performed at [facility], [address] [place], [place] [address]  C. Diff by PCR,  Reflexed     Status: Abnormal   Collection Time: 02/10/23 11:30 AM  Result Value Ref Range Status   Toxigenic C. Difficile by PCR POSITIVE (A) NEGATIVE Final    Comment: Positive for toxigenic C. difficile with little to no toxin production. Only treat if clinical presentation suggests symptomatic illness. Performed at [facility], [address], [place], [place] [address]   MRSA Next Gen by PCR, Nasal     Status: Abnormal   Collection Time: 02/10/23  2:48 PM   Specimen: Nasal Mucosa; Nasal Swab  Result Value Ref Range Status   MRSA by PCR Next Gen DETECTED (A) NOT DETECTED Final    Comment: (NOTE) The GeneXpert MRSA Assay (FDA approved for NASAL specimens only), is one component of a comprehensive MRSA colonization surveillance program. It is not intended to diagnose MRSA infection nor to guide or monitor treatment for MRSA infections. Test performance is not FDA approved in patients less than 53 years old. Performed at [facility], [address], [place], [place] [address]    Ammonia 12/2: 25 RPR: Non reactive  CT Head without contrast 02/10/23 (Personally reviewed): 1. No acute intracranial findings. 2. Chronic microvascular ischemic  change and cerebral volume loss.  MRI Brain and cervical spine 02/17/23 (Personally reviewed): 1. No acute intracranial abnormality. 2. Advanced atrophy and chronic small vessel ischemic disease. 3. Slightly worsened moderate right C4-5 neural foraminal stenosis due to facet arthrosis. 4. No spinal canal stenosis.  Neurodiagnostics rEEG 02/11/23:  "This study is suggestive of moderate to severe diffuse encephalopathy. No seizures or epileptiform discharges were seen throughout the recording."  ASSESSMENT   XZAVIA FEINSTEIN is a 67 y.o. adult  has a past medical history of Acute kidney injury superimposed on chronic kidney disease (HCC) (02/10/2020), Acute on chronic kidney failure (HCC) (09/28/2019), Closed nondisplaced  spiral fracture of shaft of right tibia, Community acquired pneumonia (03/24/2021), Complicated urinary tract infection (09/06/2016), Depression, Diabetes mellitus without complication (HCC), GERD (gastroesophageal reflux disease), Gout, Hemorrhoids, Hypertension, Macular degeneration, Microcytic anemia, PNA (pneumonia) (03/26/2021), Prostate cancer (HCC) (08/2009), and Tubular adenoma. Patient presented to [facility] for the second time in one month with altered mental status, findings concerning for sepsis and UTI.  Patient was found to be septic with acute encephalopathy with unremarkable MRI brain, cervical spine, CTH imaging and rEEG findings of severe diffuse encephalopathy without seizures or epileptiform discharges.  Due to ongoing encephalopathy despite treatment of patient's presenting Cdiff, UTI, and sepsis neurology was consulted.  Presentation of encephalopathy is unclear at this time though stroke was ruled out by MRI brain. Initial EEG negative for seizures or epileptiform discharges though patient was on cefepime from 11/25 - 12/1 concerning for cefepime neurotoxicity. Would hold further cefepime doses and allow time for patient to clear cefepime and repeat rEEG.  On exam, patient does have significant limitation of neck range of motion, specifically with chin to chest tucking but also limited lateral rotation.  Patient will need lumbar puncture for evaluation of possible meningitis though bedside procedure would be risky with patient on clopidogrel. Other workup for reversible causes of encephalopathy has been unremarkable: ammonia 25, RPR non reactive, UDS negative, blood culture no growth to date.   RECOMMENDATIONS  - Lumbar puncture for CSF - CSF protein, glucose, cell count with differential, culture - Empiric meningitis coverage with acyclovir per pharmacy for renal dosing - Repeat rEEG - Hold clopidogrel prior to LP, consider under fluoro with recent clopidogrel dosing - Avoid use of  cefepime - Continue to monitor patient's neurologic status as cefepime is cleared  - Would hold Ritalin while cefepime toxicity remains on the differential diagnosis list at this time - Serum CK, TSH, HIV ordered for evaluation of reversible causes of encephalopathy  - Continue to evaluate and treat for metabolic and infectious processes per CCM as you are  ______________________________________________________________________  Lanae Boast, AGACNP-BC [facility] Pager: [phone]  I have seen the patient and reviewed the above note.  He has a fairly dense encephalopathy with markedly increased tone throughout.  On my exam, I am able to get him to have a flicker of movement, flexion versus minimal withdrawal in all four extremities.  His tone is markedly increased in the upper extremities more than the lower extremities, with the exception of the right lower extremity which does not have particularly increased tone.  He fixates and tracks across midline in both directions, but does not blink to threat.  He does not follow commands.  I do not think level of arousal is the main barrier, and therefore I am not sure Ritalin would need to be continued at this time.,  Could consider in the future if level of arousal does  seem to be the problem.  He appears to have meningismus, but I am not sure if this is just part of the overall increased tone or if this is true meningismus.  Either way, LP is indicated and he is going for that this afternoon.  I would also repeat EEG, as frequent GPD's with triphasic morphology could support cefepime toxicity.  He received 2 g every 12 hours for several days, and so this is still in the differential, though it is more common in people with renal failure.  If this is the ca he should improve over the next few days  His CK is only 25, making neuroleptic malignant syndrome much less likely but there have been case reports of NMS with normal CKs.  For now,  I think initial evaluation should consist of LP for cells, protein, glucose, culture, meningitis/encephalitis panel EEG Would observe to see if he improves as cefepime gets out of his system Continue holding olanzapine  Neurology will continue to follow.  Ritta Slot, MD [facility] [phone]  If 7pm- 7am, please page neurology on call as listed in AMION.

## 2023-02-18 NOTE — Procedures (Signed)
Interventional Radiology Procedure Note  Procedure: Fluoro guided lumbar puncture  Complications: None  Estimated Blood Loss: None  Findings: 20 G spinal needle with single puncture at L3/4 level from midline approach.  Return of clear CSF, opening pressure approximately 8-10 cm water.   12 mL CSF collected in 4 tubes.  Jodi Marble. Fredia Sorrow, M.D Pager:  [phone]

## 2023-02-18 NOTE — Progress Notes (Signed)
eLink Physician-Brief Progress Note Patient Name: [name] DOB: July 13, 1955 MRN: [id number]   Date of Service  02/18/2023  HPI/Events of Note  67 yo M w/ pertinent PMH dementia, IDDM-2, HTN, HLD and mood disorder presents to [facility] ED on 11/25 w/ ams.  Ongoing encephalopathy and has several skin lesions, previously required fecal management system  No urinary output for greater than 8 hours, bladder scan with greater than 391.  eICU Interventions  Voiding trial, continue with intermittent In-N-Out cath for greater than 400 cc retained.  Continue with urinary retention guidelines.     Intervention Category Minor Interventions: Routine modifications to care plan (e.g. PRN medications for pain, fever)  Jibri Schriefer 02/18/2023, 3:36 AM

## 2023-02-18 NOTE — Inpatient Diabetes Management (Signed)
Inpatient Diabetes Program Recommendations  AACE/ADA: New Consensus Statement on Inpatient Glycemic Control   Target Ranges:  Prepandial:   less than 140 mg/dL      Peak postprandial:   less than 180 mg/dL (1-2 hours)      Critically ill patients:  140 - 180 mg/dL    Latest Reference Range & Units 02/17/23 07:44 02/17/23 11:28 02/17/23 15:46 02/17/23 19:34 02/17/23 23:42 02/18/23 03:25 02/18/23 08:04  Glucose-Capillary 70 - 99 mg/dL 130 (H) 865 (H) 784 (H) 166 (H) 147 (H) 169 (H) 204 (H)   Review of Glycemic Control  Diabetes history: DM2 Outpatient Diabetes medications: Lantus 5 units daily, Humalog 1-6 units TID with meals, Farxiga 10 mg daily Current orders for Inpatient glycemic control: Novolog 0-20 units Q4H, Vital @ 60 ml/hr  Inpatient Diabetes Program Recommendations:    Insulin-Tube Feeding: May want to consider ordering Novolog 3 units Q4H for tube feeding coverage. If tube feeding is stopped or held then Novolog tube feeding coverage should also be stopped or held.  Thanks, Orlando Penner, RN, MSN, CDCES Diabetes Coordinator Inpatient Diabetes Program [phone] (Team Pager from 8am to 5pm)

## 2023-02-18 NOTE — Progress Notes (Signed)
PCCM Brief Note  Notified by RN of lab call for GPC's in CSF.   Discussed with pharmacy regarding abx given multiple infections (C.diff, VRE/MRSA UTI and now presumed meningitis).  Pharm consult for IV vanc, CTX, Ampicillin (change from current Amoxicillin), Acyclovir + PO vanc/flagyl for C.diff.   Await further culture results and speciation.   Rutherford Guys, PA - [facility] For pager details, please see AMION or use Epic chat  After 1900, please call [facility] for cross coverage needs 02/18/2023, 6:54 PM

## 2023-02-18 NOTE — Progress Notes (Signed)
Pt started on BIPAP due to increased WOB, RR and HR.  Pt placed on 10/5, R 20 and 40%.  RN at bedside.

## 2023-02-18 NOTE — Progress Notes (Signed)
EEG application was complete. Before Tech could initiate study, was informed that patient had to go down to IR for LP. Amada Jupiter, MD consulted on patient. Tech reached out and informed Amada Jupiter of situation. Was told that LP takes precedence over EEG. Told Neuro that take may not be able to run EEG today. Tech has another patient. LP is going to be about an hour and a half. Amada Jupiter understood situation. Curly Rim, MD as well as EEG lead.

## 2023-02-18 NOTE — Progress Notes (Signed)
EEG complete - results pending 

## 2023-02-18 NOTE — Procedures (Signed)
Extubation Procedure Note  Patient Details:   Name: [name] DOB: 05-26-1955 MRN: [id number]   Airway Documentation:    Vent end date: 02/18/23 Vent end time: 1125   Evaluation  O2 sats: stable throughout Complications: No apparent complications Patient did tolerate procedure well. Bilateral Breath Sounds: Diminished   Yes  Pt extubated per MD order.  Pt placed on 5L [place].  RN at bedside.  HR 85, RR 22.  Sats 97%.  Cain Sieve 02/18/2023, 11:29 AM

## 2023-02-18 NOTE — Progress Notes (Addendum)
Pharmacy Antibiotic Note  [name] is a 68 y.o. adult admitted on 02/10/2023 with sepsis. Concern for pneumonia as well as C.diff infection. Patient initially treated with IV antibiotics for pneumonia as well as PO vancomycin and IV metronidazole for C.diff. Urine culture with E.faecalis and MRSA. Patient was treated with 7 days of vancomycin and Zosyn/cefepime and was placed on amoxicillin to complete course for E.faecalis in urine. On 12/3 concern for meningitis. LP done; GPC on gram stain with culture pending. Pharmacy has been consulted for vancomycin, ampicillin and acyclovir dosing. Patient placed on ceftriaxone as well.  Plan: -Vancomycin 1000 mg IV q12h -Ampicillin 2 g IV q4h -Ceftriaxone 2 g IV q12h -Acyclovir 750 mg (10 mg/kg) q8h + NS 50 ml/hr for AKI prevention while on IV acyclovir  Height: 5\' 6"  (167.6 cm) Weight: 75 kg (165 lb 5.5 oz) IBW/kg (Calculated) : 63.8  Temp (24hrs), Avg:98.2 F (36.8 C), Min:97.8 F (36.6 C), Max:98.6 F (37 C)  Recent Labs  Lab 02/14/23 0714 02/15/23 0322 02/16/23 0651 02/17/23 0546 02/18/23 0415  WBC 1.3* 1.6* 2.9* 3.9* 5.1  CREATININE 0.93 0.85 0.82 0.78 0.59*    Estimated Creatinine Clearance (by C-G formula based on SCr of 0.59 mg/dL (L)) Male: 16.1 mL/min (A) Male: 78.6 mL/min (A)    Allergies  Allergen Reactions   Haldol [Haloperidol] Other (See Comments)    Per family cause life threatening complications to patient Per Pt, it causes "complications with my mouth"    Antimicrobials this admission:  Ceftriaxone 12/3 >> Vancomycin 12/3 >> Ampicillin 12/3 >> Acyclovir 12/3 >> Amoxicillin 12/2 >> 12/3 Vancomycin (oral) 11/26 >> Metronidazole 11/25 >> Vancomycin 11/25 >> 12/1 Cefepime 11/26 >> 12/1 Zosyn 11/25 >> 11/26 Azithro 11/25 >> 11/28  Dose adjustments this admission: NA  Microbiology results: 12/03 CSF: 11/25 BCx: ngtd 11/25 UCx:  50k E.faecalis, 30k MRSA 11/25 C.diff: Ag positive, toxin negative, PCR  positive 11/25 GI panel: negative 11/25 MRSA PCR: positive  Thank you for allowing pharmacy to be a part of this patient's care.  Pricilla Riffle, PharmD, BCPS Clinical Pharmacist 02/18/2023 7:36 PM

## 2023-02-19 ENCOUNTER — Other Ambulatory Visit (HOSPITAL_COMMUNITY): Payer: 59

## 2023-02-19 ENCOUNTER — Inpatient Hospital Stay (HOSPITAL_COMMUNITY): Payer: 59

## 2023-02-19 DIAGNOSIS — J9601 Acute respiratory failure with hypoxia: Secondary | ICD-10-CM | POA: Diagnosis not present

## 2023-02-19 DIAGNOSIS — G9341 Metabolic encephalopathy: Secondary | ICD-10-CM | POA: Diagnosis not present

## 2023-02-19 DIAGNOSIS — E119 Type 2 diabetes mellitus without complications: Secondary | ICD-10-CM | POA: Diagnosis not present

## 2023-02-19 DIAGNOSIS — A0472 Enterocolitis due to Clostridium difficile, not specified as recurrent: Secondary | ICD-10-CM | POA: Diagnosis not present

## 2023-02-19 LAB — CBC
HCT: 28.4 % — ABNORMAL LOW (ref 39.0–52.0)
Hemoglobin: 9.4 g/dL — ABNORMAL LOW (ref 13.0–17.0)
MCH: 28.5 pg (ref 26.0–34.0)
MCHC: 33.1 g/dL (ref 30.0–36.0)
MCV: 86.1 fL (ref 80.0–100.0)
Platelets: 181 10*3/uL (ref 150–400)
RBC: 3.3 MIL/uL — ABNORMAL LOW (ref 4.22–5.81)
RDW: 18.5 % — ABNORMAL HIGH (ref 11.5–15.5)
WBC: 8.5 10*3/uL (ref 4.0–10.5)
nRBC: 0 % (ref 0.0–0.2)

## 2023-02-19 LAB — BLOOD GAS, ARTERIAL
Acid-base deficit: 4.5 mmol/L — ABNORMAL HIGH (ref 0.0–2.0)
Bicarbonate: 20.3 mmol/L (ref 20.0–28.0)
Drawn by: 66453
O2 Saturation: 99.6 %
Patient temperature: 36.5
pCO2 arterial: 35 mm[Hg] (ref 32–48)
pH, Arterial: 7.37 (ref 7.35–7.45)
pO2, Arterial: 99 mm[Hg] (ref 83–108)

## 2023-02-19 LAB — MENINGITIS/ENCEPHALITIS PANEL (CSF)

## 2023-02-19 LAB — BASIC METABOLIC PANEL
Anion gap: 8 (ref 5–15)
BUN: 30 mg/dL — ABNORMAL HIGH (ref 8–23)
CO2: 20 mmol/L — ABNORMAL LOW (ref 22–32)
Calcium: 7.8 mg/dL — ABNORMAL LOW (ref 8.9–10.3)
Chloride: 106 mmol/L (ref 98–111)
Creatinine, Ser: 0.74 mg/dL (ref 0.61–1.24)
GFR, Estimated: >60 mL/min (ref >60)
Glucose, Bld: 174 mg/dL — ABNORMAL HIGH (ref 70–99)
Potassium: 3.7 mmol/L (ref 3.5–5.1)
Sodium: 134 mmol/L — ABNORMAL LOW (ref 135–145)

## 2023-02-19 LAB — GLUCOSE, CAPILLARY
Glucose-Capillary: 117 mg/dL — ABNORMAL HIGH (ref 70–99)
Glucose-Capillary: 145 mg/dL — ABNORMAL HIGH (ref 70–99)

## 2023-02-19 LAB — VDRL, CSF: VDRL Quant, CSF: NONREACTIVE

## 2023-02-19 LAB — PHOSPHORUS: Phosphorus: 3.3 mg/dL (ref 2.5–4.6)

## 2023-02-19 LAB — MAGNESIUM: Magnesium: 1.5 mg/dL — ABNORMAL LOW (ref 1.7–2.4)

## 2023-02-19 MED ORDER — GLYCOPYRROLATE 0.2 MG/ML IJ SOLN
0.2000 mg | INTRAMUSCULAR | Status: DC | PRN
  Administered 2023-02-19 – 2023-02-20 (×3): 0.2 mg via INTRAVENOUS
  Filled 2023-02-19 (×3): qty 1

## 2023-02-19 MED ORDER — LORAZEPAM 2 MG/ML IJ SOLN
2.0000 mg | INTRAMUSCULAR | Status: DC | PRN
  Administered 2023-02-19: 2 mg via INTRAVENOUS
  Filled 2023-02-19: qty 1

## 2023-02-19 MED ORDER — GLYCOPYRROLATE 0.2 MG/ML IJ SOLN: 0.2000 mg | INTRAMUSCULAR | Status: DC | PRN

## 2023-02-19 MED ORDER — ETOMIDATE 2 MG/ML IV SOLN
INTRAVENOUS | Status: AC
  Filled 2023-02-19: qty 20

## 2023-02-19 MED ORDER — MIDAZOLAM HCL 2 MG/2ML IJ SOLN
INTRAMUSCULAR | Status: AC
  Filled 2023-02-19: qty 2

## 2023-02-19 MED ORDER — ACETAMINOPHEN 650 MG RE SUPP: 650.0000 mg | Freq: Four times a day (QID) | RECTAL | Status: DC | PRN

## 2023-02-19 MED ORDER — MORPHINE SULFATE (PF) 2 MG/ML IV SOLN
2.0000 mg | INTRAVENOUS | Status: DC | PRN
  Administered 2023-02-19 (×5): 2 mg via INTRAVENOUS
  Filled 2023-02-19 (×5): qty 1

## 2023-02-19 MED ORDER — ROCURONIUM BROMIDE 10 MG/ML (PF) SYRINGE
PREFILLED_SYRINGE | INTRAVENOUS | Status: AC
  Filled 2023-02-19: qty 10

## 2023-02-19 MED ORDER — KETAMINE HCL 50 MG/5ML IJ SOSY
PREFILLED_SYRINGE | INTRAMUSCULAR | Status: AC
  Filled 2023-02-19: qty 10

## 2023-02-19 MED ORDER — ACETAMINOPHEN 325 MG PO TABS: 650.0000 mg | ORAL_TABLET | Freq: Four times a day (QID) | ORAL | Status: DC | PRN

## 2023-02-19 MED ORDER — MORPHINE SULFATE (PF) 2 MG/ML IV SOLN
2.0000 mg | Freq: Once | INTRAVENOUS | Status: AC
  Administered 2023-02-19: 2 mg via INTRAVENOUS

## 2023-02-19 MED ORDER — MORPHINE SULFATE (PF) 2 MG/ML IV SOLN
INTRAVENOUS | Status: AC
  Filled 2023-02-19: qty 1

## 2023-02-19 MED ORDER — SUCCINYLCHOLINE CHLORIDE 200 MG/10ML IV SOSY
PREFILLED_SYRINGE | INTRAVENOUS | Status: AC
  Filled 2023-02-19: qty 10

## 2023-02-19 MED ORDER — FENTANYL CITRATE (PF) 100 MCG/2ML IJ SOLN
INTRAMUSCULAR | Status: AC
  Filled 2023-02-19: qty 2

## 2023-02-19 MED ORDER — POLYVINYL ALCOHOL 1.4 % OP SOLN: 1.0000 [drp] | Freq: Four times a day (QID) | OPHTHALMIC | Status: DC | PRN

## 2023-02-19 MED ORDER — GLYCOPYRROLATE 1 MG PO TABS: 1.0000 mg | ORAL_TABLET | ORAL | Status: DC | PRN

## 2023-02-19 MED ORDER — ONDANSETRON HCL 4 MG/2ML IJ SOLN
4.0000 mg | Freq: Four times a day (QID) | INTRAMUSCULAR | Status: DC | PRN
  Administered 2023-02-19: 4 mg via INTRAVENOUS
  Filled 2023-02-19 (×2): qty 2

## 2023-02-19 MED ORDER — FENTANYL CITRATE PF 50 MCG/ML IJ SOSY
PREFILLED_SYRINGE | INTRAMUSCULAR | Status: AC
  Filled 2023-02-19: qty 2

## 2023-02-19 NOTE — Progress Notes (Signed)
This chaplain responded to unit page for spiritual care. The chaplain understands the Pt. sisters agreed to proceed with comfort measures. The Pt. sister-Tonia and brother in law are at the bedside. [name] is navigating additional family through Face Time.  The chaplain invited [name] to participate in storytelling in the setting of getting to know the Pt. The chaplain understands the Pt. is an avid football fan. [name] is uncertain of the Pt. religious beliefs and asks for prayer.   The chaplain provided education with the family on how to F/U with a chaplain as needed.  Chaplain Stephanie Acre [phone]

## 2023-02-19 NOTE — Progress Notes (Addendum)
This writer was called to patient bedside due to patient vomiting while wearing bipap.  Upon arrival, pt was being orally suctioned by bedside RN.  Pt noted for audible rhonchi.  Pt hyper-oxygenated on 15L nrb then NT suctioned x3 for copious amount of thick white /tan secretions.  Rhonchi still noted throughout all lung fields.  Pt placed back on NRB at this time.  HOB @45  degrees.  HR127, RR30, Spo2 99%

## 2023-02-19 NOTE — Plan of Care (Signed)
Noting the patient has transitioned to comfort care, neurology will sign off at this time.  Please reach out if any questions or concerns arise

## 2023-02-19 NOTE — Progress Notes (Signed)
Pt hyper-oxygenated on 100% fio2 prior to NT suction.  Pt NT suctioned x2 down his right nare for copious amount of thick white / tan secretions.  Pt tolerated well without incident.  RN assisted at bedside.

## 2023-02-19 NOTE — Progress Notes (Signed)
Pt NT suctioned x2 down right nare after cpt was done.  Pt was suctioned for a copious amount of thick white secretions.  Pt was hyper-oxygenated on 100% fio2 prior to suctioning.  Pt tolerated well without incident.  RN aware.

## 2023-02-19 NOTE — Progress Notes (Signed)
Was in pt room setting up EEG and RN came in to say EEG is no longer needed, pt is on comfort care

## 2023-02-19 NOTE — Progress Notes (Signed)
Nutrition Brief Note  Chart reviewed. Pt now transitioning to comfort care.  No further nutrition interventions planned at this time.  Please re-consult as needed.   Rayanna Matusik RD, LDN For contact information, refer to AMiON.   

## 2023-02-19 NOTE — Progress Notes (Signed)
Bipap on standby 

## 2023-02-19 NOTE — Plan of Care (Signed)
PT had an aspiration episode on the BIPAP early this morning. Status following this episode continued to decline to the point of need for mechanical ventilation again. Family made decision for comfort at this time based on provider conversation and prognosis. PT continues to be unresponsive. PRN meds given for agitation/pain. Will continue to assess for changes in condition.

## 2023-02-19 NOTE — Progress Notes (Addendum)
NAME:  [name], MRN:  [id number], DOB:  1955/04/15, LOS: 9 ADMISSION DATE:  02/10/2023, CONSULTATION DATE:  11/25 REFERRING MD:  Dr. Rhunette Croft, CHIEF COMPLAINT:  acute respiratory failure w/ hypoxia; encephalopathy   History of Present Illness:  Patient is a 67 yo M w/ pertinent PMH dementia, IDDM-2, HTN, HLD and mood disorder presents to [facility] ED on 11/25 w/ ams.  Patient recently admitted last month w/ confusion/lethargy. Found to have severe sepsis from staph aureus uti. Discharged back to SNF. On 11/25 patient w/ AMS. On ems arrival patient sob and unresponsive. Sats 84% and started on supplemental O2. Transported to [facility] ED.  On arrival to [facility] ED on 11/25, patient remains altered. Febrile 100.5 and tachycardic 120s. BP stable. LA 3.8 and given IV fluids. Placed on NRB w/ sats low 90s. CBG 51 given dextrose. Cultures obtained and started on broad spectrum abx. Vbg 7.38, 41, 31, 24. CXR and CT head w/ no significant findings. Patient having diarrhea while in ED. CT abd/pelvis showing dense LLL consolidation and patchy opacities in Right lung base suspicious for pna; mild wall thickening of descending colon/sigmoid colon possible colitis; stable chronic right sided hydronephrosis. Covid/flu negative. UA w/ large leukocytes and few bacteria. Cdiff antigen showing positive antigen and negative toxin; PCR pending. Patient remained encephalopathic and BP remains soft despite IV fluids. On HHFNC 80% fio2. ED plans to intubate given increase in o2 needs and poor mental status. PCCM consulted for icu admission.  Pertinent ED labs: creat 3.72, ast 58, alk phosph 204, AG 17, ammonia 18   Pertinent  Medical History   has a past medical history of Acute kidney injury superimposed on chronic kidney disease (HCC) (02/10/2020), Acute on chronic kidney failure (HCC) (09/28/2019), Closed nondisplaced spiral fracture of shaft of right tibia, Community acquired pneumonia (03/24/2021), Complicated urinary tract  infection (09/06/2016), Depression, Diabetes mellitus without complication (HCC), GERD (gastroesophageal reflux disease), Gout, Hemorrhoids, Hypertension, Macular degeneration, Microcytic anemia, PNA (pneumonia) (03/26/2021), Prostate cancer (HCC) (08/2009), and Tubular adenoma.   Significant Hospital Events: Including procedures, antibiotic start and stop dates in addition to other pertinent events   11/25 admitted w/ ams and respiratory failure w/ hypoxia 11/26 EEG with moderate to severe diffuse encephalopathy, no seizures or epileptiform discharges 11/27 MRI with chronic microvascular changes, no acute stroke 12/1 tolerating weaning pressure support 10/5 12/2 MRI brain and Cspine > no acute process, advanced atrophy, slightly worsened moderate right C4-5 neural foraminal stenosis due to facet arthrosis 12/3 Extubation to BIPAP, LP performed  12/4 Patient on BIPAP overnight, vomited into BIPAP early morning. CXR obtained. 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   Interim History / Subjective:  Alert, nonverbal, unable to follow commands Vomiting early morning on BIPAP   Objective   Blood pressure 122/86, pulse (!) 119, temperature 97.9 F (36.6 C), temperature source Axillary, resp. rate (!) 38, height 5\' 6"  (1.676 m), weight 75.3 kg, SpO2 99%.    Vent Mode: BIPAP FiO2 (%):  [30 %-50 %] 50 % Set Rate:  [20 bmp] 20 bmp PEEP:  [5 cmH20] 5 cmH20 Pressure Support:  [8 cmH20-10 cmH20] 10 cmH20   Intake/Output Summary (Last 24 hours) at 02/19/2023 0729 Last data filed at 02/19/2023 0644 Gross per 24 hour  Intake 4275.03 ml  Output 1075 ml  Net 3200.03 ml   Filed Weights   02/17/23 0500 02/18/23 0321 02/19/23 0500  Weight: 73.9 kg 75 kg 75.3 kg    Examination: General: acute on chronic elderly  male, lying in ICU bed  HEENT: Normocephalic, NG tube, NRB Neuro: alert, not following commands CV: s1, s2, RRR, Tachycardia, no JVD  PULM: clear in upper lung fields, tachypnea   GI: Active BS, abdomen soft Extremities: muscle flickering in upper extremities, no movement in lower extremities with painful stimuli  Skin: two stage 2 pressure ulcers on buttocks BL, DTI on sacrum per RN documentation, UTA due to respiratory status at this time   Resolved Hospital Problem list   Septic shock Transaminitis  Assessment & Plan:   Acute hypoxemic respiratory failure with inability to protect the airway Extubation 12/3 to BIPAP, vomiting into BIPAP on morning of 12/4 concern for aspiration  High risk for reintubation, RR 30s, O2 sats 99% on NRB  P:  Continue monitoring respiratory status  Continue aggressive pulmonary hygiene  CXR pending  Consider Chest PT via bed    C. difficile colitis  P: Continue IV flagyl Contnue Oral Vanc   MRSA and VRE UTI P: Continue Vanc Continue Amoxicillin x 5 days till 12/6   Metabolic encephalopathy  Secondary to cefepime toxicity vs meningitis vs seizures vs dementia hx  Despite no sedation. Unclear etiology at this point, could be sepsis alone?  EEG 11/26 and MRI 11/27 and MRI repeat 12/2 negative though MRI 11/27 did mention possibility of NPH (not mentioned on repeat). Ammonia and TSH normal Cefepime started 11/25 to 12/1 Repeat EEG on 12/3- moderate to severe diffuse encephalopathy, no seizures Concern for Bacterial Meningitis 12/3 LP- Gram stain GPC, Antibiotics started for coverage  CSF clear, glucose 116, no WBC  CSF Biofire panel negative P: Neuro consulted, following appreciate assistance  CSF Culture pending  VDRL CSF pending  Consider deescalating Vanc, Ampicillin, Cetriaxone, and Acyclovir  Continue neuro stimulant methylphenidate 5mg  per tube BID   IDDM P:  Continue SSI, CBG q 4  Hypophosphatemia 12/3 Kphos x 1 P:  Repeat Mag/Phos  Continue to trend BMP   Pancytopenia  Stable. Likely in the context of sepsis P: Continue to trend CBC daily   History of urinary  retention/hydronephrosis Foley placed 12/3 for retention  P:  Continue Foley for now, monitor UO   History of mood disorder/dementia P:  Continue Namenda, Depakote per tube  Continue to hold setraline/zyprexa   Best Practice (right click and "Reselect all SmartList Selections" daily)   Diet/type: tubefeeds DVT prophylaxis: SCDs Start: 02/10/23 1427  Pressure ulcer(s): not present on admission  GI prophylaxis: H2B Lines: N/A Foley:  Yes, and it is still needed Code Status:  DNR; Tonia states DNR but okay with intubation. Believes would not want to be on vent long term. Last date of multidisciplinary goals of care discussion 12/4 will update family via phone   Critical Care time: 40 mins   Christian Katrinka Blazing AGACNP-BC   [facility] 02/19/2023, 8:35 AM  Please see [email or web address] for pager details.  From 7A-7P if no response, please call [phone]. After hours, please call ELink [phone].

## 2023-02-19 NOTE — TOC Progression Note (Signed)
Transition of Care [facility]) - Progression Note    Patient Details  Name: [name] MRN: [id number] Date of Birth: 03/08/1956  [facility]) CM/SW Contact  Amada Jupiter, LCSW Phone Number: 02/19/2023, 10:27 AM  Clinical Narrative:     TOC continues to monitor chart for dc needs with plan to return to [facility] when medically ready.  Expected Discharge Plan: Long Term Nursing Home Barriers to Discharge: Continued Medical Work up  Expected Discharge Plan and Services In-house Referral: Clinical Social Work   Post Acute Care Choice: Nursing Home Living arrangements for the past 2 months:  (LTC bed at [facility])                 DME Arranged: N/A DME Agency: NA                   Social Determinants of Health (SDOH) Interventions SDOH Screenings   Food Insecurity: Patient Unable To Answer (02/10/2023)  Housing: Patient Unable To Answer (02/10/2023)  Transportation Needs: Patient Unable To Answer (02/10/2023)  Utilities: Patient Unable To Answer (02/10/2023)  Depression (PHQ2-9): High Risk (12/06/2020)  Financial Resource Strain: Low Risk  (07/24/2021)   Received from [facility], [facility]  Physical Activity: Insufficiently Active (08/26/2019)  Social Connections: Unknown (07/24/2021)   Received from [facility], [facility]  Stress: Stress Concern Present (07/24/2021)   Received from [facility], [facility]  Tobacco Use: Low Risk  (02/10/2023)    Readmission Risk Interventions    10/31/2021    2:47 PM  Readmission Risk Prevention Plan  Transportation Screening Complete  Medication Review (RN Care Manager) Complete  PCP or Specialist appointment within 3-5 days of discharge Complete  SW Recovery Care/Counseling Consult Complete  Palliative Care Screening Not Applicable  Skilled Nursing Facility Complete

## 2023-02-19 NOTE — IPAL (Signed)
  Interdisciplinary Goals of Care Family Meeting   Date carried out: 02/19/2023  Location of the meeting: Phone conference  Member's involved: Davonna Belling, Louie Bun (patient's sisters) via phone  MD Myrla Halsted    Durable Power of Attorney or acting medical decision maker: Davonna Belling   Discussion: Notified by beside RN that patient was decompensating-patient having increased RR, work of breathing, worsening respiratory distress. Patient also beginning to have signs of hypoxia and mottling.  Called both sisters [name] and [name]) in regards to patient's status. Per documentation, family has conversed that patient would not want long term mechanical ventilation/life support measures. Patient was extubated on 12/3 and placed on BIPAP. Patient did have a vomiting episode on BIPAP this morning on 12/4 with high concerns of aspiration. We discussed goals of care for [name] and [name] and [name] agreed that the patient would not want to be placed back on mechanical ventilation or other forms of life support. This was witnessed by MD Myrla Halsted via phone. Recommendations was made to transition the patient to comfort care. After Marcelene Butte further discussed, both agreed to proceed with comfort care measures.   Code status:   Code Status: Do not attempt resuscitation (DNR) PRE-ARREST INTERVENTIONS DESIRED   Disposition: In-patient comfort care  Time spent for the meeting: 25 mins   Christian Johathon Overturf AGACNP-BC   [facility] 02/19/2023, 10:51 AM  Please see [email or web address] for pager details.  From 7A-7P if no response, please call [phone]. After hours, please call ELink [phone].

## 2023-02-20 DIAGNOSIS — J9601 Acute respiratory failure with hypoxia: Secondary | ICD-10-CM | POA: Diagnosis not present

## 2023-02-20 DIAGNOSIS — Z515 Encounter for palliative care: Secondary | ICD-10-CM

## 2023-02-20 MED ORDER — MORPHINE BOLUS VIA INFUSION
5.0000 mg | INTRAVENOUS | Status: DC | PRN
Start: 2023-02-20 — End: 2023-02-20
  Administered 2023-02-20: 5 mg via INTRAVENOUS

## 2023-02-20 MED ORDER — MORPHINE 100MG IN NS 100ML (1MG/ML) PREMIX INFUSION
0.0000 mg/h | INTRAVENOUS | Status: DC
Start: 2023-02-20 — End: 2023-02-20
  Administered 2023-02-20: 5 mg/h via INTRAVENOUS
  Filled 2023-02-20: qty 100

## 2023-02-20 MED ORDER — SCOPOLAMINE 1 MG/3DAYS TD PT72
1.0000 | MEDICATED_PATCH | TRANSDERMAL | Status: DC
  Administered 2023-02-20: 1.5 mg via TRANSDERMAL
  Filled 2023-02-20: qty 1

## 2023-02-22 LAB — CSF CULTURE W GRAM STAIN
Culture: NO GROWTH
Gram Stain: NONE SEEN

## 2023-03-19 NOTE — Progress Notes (Signed)
Chaplain provided grief support to [name]'s sister [name] who was at bedside at the time of his death.  Chaplain provided listening as she shared about family dynamics, other family deaths and her own feelings.  Chaplain provided prayer for [name], for [name] and for the family.  [address], Bcc Pager, [phone]

## 2023-03-19 NOTE — Care Plan (Signed)
Patient is comfort care and not responding.

## 2023-03-19 NOTE — Progress Notes (Signed)
PT expired at 1450. CCM notified of change in patient status. Sister at bedside hysterical, pastoral services offered and accepted. Will give family time with patient then prepare him for morgue.   Body preparation complete. PT to morgue via gurney at 6:15pm

## 2023-03-19 NOTE — Death Summary Note (Signed)
DEATH SUMMARY   Patient Details  Name: [name] MRN: [id number] DOB: 1955-12-20  Admission/Discharge Information   Admit Date:  02/24/23  Date of Death: Date of Death: 2023/03/06  Time of Death: Time of Death: 1450  Length of Stay: 2023/03/11  Referring Physician: Pcp, No   Reason(s) for Hospitalization  Altered mental status, sepsis, hypoxic respiratory failure    Diagnoses  Preliminary cause of death: Dementia  Secondary Diagnoses (including complications and co-morbidities):  Principal Problem:   Acute respiratory failure with hypoxia (HCC) Active Problems:   Septic shock (HCC)   Encephalopathy acute   AKI (acute kidney injury) (HCC)   Acute encephalopathy   Pressure injury of skin   Malnutrition of moderate degree   Encounter for palliative care Goals of care discussion DNR status Severe sepsis, associated encephalopathy -- Possible Bacterial Meningitis  C Diff Colitis MRSA and VRE UTI  Metabolic acidosis  IDDM with hyperglycemia  Hypokalemia Hypomagnesemia  Hypophosphatemia  Hyponatremia  Hypocalcemia  Pancytopenia, improved Anemia  Hypertriglyceridemia  Urinary retention Hx hydronephrosis Hx Dementia with behavioral disturbance   Brief Hospital Course (including significant findings, care, treatment, and services provided and events leading to death)  [name] is a 68 y.o. year old adult who presented to [facility] ED [facility]-24-23 from SNF with altered mental status and hypoxia. He was intubated and started on treatment for sepsis, and admitted to [facility]. Unfortunately despite treating reversible causes the patient's mental status failed to improve, and he had poor airway protection following extubation 12/3. On 12/4 a decision was reached to shift to comfort care. The patient died peacefully on 06-Mar-2023 at 1450 with family at the bedside.     Feb 24, 2023 admitted w/ ams and respiratory failure w/ hypoxia 11/26 EEG with moderate to severe diffuse encephalopathy, no seizures or  epileptiform discharges 11/27 MRI with chronic microvascular changes, no acute stroke 12/1 tolerating weaning pressure support 10/5 12/2 MRI brain and Cspine > no acute process, advanced atrophy, slightly worsened moderate right C4-5 neural foraminal stenosis due to facet arthrosis 12/3 Extubation to BIPAP, LP performed  12/4 Patient on BIPAP overnight, vomited into BIPAP early morning. CXR obtained. 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 Transition to comfort care  03/06/2023 ongoing comfort care. TOD 1450    Pertinent Labs and Studies  Significant Diagnostic Studies DG CHEST PORT 1 VIEW  Result Date: 02/19/2023 CLINICAL DATA:  Aspiration into airway. EXAM: PORTABLE CHEST 1 VIEW COMPARISON:  02/17/2023, 02/14/2023 and CT 02/17/2022. FINDINGS: Lung volumes are larger on the current exam. Persistent opacity at the left lung base obscures hemidiaphragm. Mild opacity at the medial right lung base is improved from the prior exam, consistent with atelectasis. Remainder of the lungs is clear. Endotracheal tube has been removed. Stable right PICC. Enteric tube replaces the nasogastric tube, enteric tube tip projecting in the left upper quadrant, within the stomach. No pneumothorax. IMPRESSION: 1. Improved lung volumes. 2. Persistent left lung base opacity consistent which may reflect atelectasis, pneumonia or pneumonitis or a combination. 3. Improved right lung base aeration consistent with improved atelectasis. 4. Support apparatus as detailed. Electronically Signed   By: Amie Portland M.D.   On: 02/19/2023 11:10   DG FL GUIDED LUMBAR PUNCTURE  Result Date: 02/18/2023 CLINICAL DATA:  Acute encephalopathy and need for lumbar puncture. EXAM: DIAGNOSTIC LUMBAR PUNCTURE UNDER FLUOROSCOPIC GUIDANCE COMPARISON:  MRI of the brain on 02/17/2023 FLUOROSCOPY: Radiation Exposure Index (as provided by the fluoroscopic device): 3.5 mGy Kerma PROCEDURE: Informed consent was  obtained from the patient's sister prior to  the procedure, including potential complications of headache, allergy, and pain. A time-out was performed prior to initiating the procedure. With the patient prone, the lower back was prepped with Betadine. 1% Lidocaine was used for local anesthesia. Multiple lumbar disc space levels were localized under fluoroscopy. Lumbar puncture was performed at the L3-4 level using a 20 gauge needle with return of clear CSF with an opening pressure of 8-10 cm water. A total of 12 mL of CSF were obtained in 4 tubes for laboratory studies. IMPRESSION: Lumbar puncture performed under fluoroscopic guidance with collection of clear CSF for diagnostic analysis. Normal opening pressure was estimated to be approximately 8-10 cm of water. Electronically Signed   By: Irish Lack M.D.   On: 02/18/2023 16:58   DG Abd Portable 1V  Result Date: 02/18/2023 CLINICAL DATA:  68 year old male enteric tube placement. EXAM: PORTABLE ABDOMEN - 1 VIEW COMPARISON:  02/10/2023 and earlier. FINDINGS: Portable AP semi upright view at 0935 hours. NG type tube in place, side hole at the level of the proximal gastric body. Superimposed enteric feeding tube with weighted tip, also present at the mid gastric body. Lower chest minimally included. It overlying EKG leads and wires. Nonobstructed bowel-gas pattern. IMPRESSION: Both the NG type tube and the enteric feeding tube terminates at the mid stomach level. Electronically Signed   By: Odessa Fleming M.D.   On: 02/18/2023 09:59   MR BRAIN WO CONTRAST  Result Date: 02/18/2023 CLINICAL DATA:  Altered mental status EXAM: MRI HEAD WITHOUT CONTRAST MRI CERVICAL SPINE WITHOUT CONTRAST TECHNIQUE: Multiplanar, multiecho pulse sequences of the brain and surrounding structures, and cervical spine, to include the craniocervical junction and cervicothoracic junction, were obtained without intravenous contrast. COMPARISON:  02/12/2023 FINDINGS: MRI HEAD FINDINGS Brain: No acute infarct, mass effect or extra-axial  collection. No chronic microhemorrhage or siderosis. There is multifocal hyperintense T2-weighted signal within the white matter. There is advanced atrophy. The midline structures are normal. Vascular: Normal flow voids. Skull and upper cervical spine: Normal marrow signal. Sinuses/Orbits: Bilateral mastoid fluid and fluid-filled nasopharynx. Normal orbits and paranasal sinuses. Other: None MRI CERVICAL SPINE FINDINGS Alignment: Grade 1 anterolisthesis at C4-5 Vertebrae: No fracture, evidence of discitis, or bone lesion. Cord: Normal signal and morphology. Posterior Fossa, vertebral arteries, paraspinal tissues: Endotracheal tube and esophageal catheter present. Normal vertebral artery flow voids. Disc levels: C1-2: Unremarkable. C2-3: Normal disc space and facet joints. There is no spinal canal stenosis. No neural foraminal stenosis. C3-4: Right facet and uncovertebral hypertrophy. There is no spinal canal stenosis. Unchanged mild right neural foraminal stenosis. C4-5: Right-greater-than-left facet hypertrophy. There is no spinal canal stenosis. Slightly worsened moderate right neural foraminal stenosis. C5-6: Normal disc space and facet joints. There is no spinal canal stenosis. No neural foraminal stenosis. C6-7: Normal disc space and facet joints. There is no spinal canal stenosis. No neural foraminal stenosis. C7-T1: Normal disc space and facet joints. There is no spinal canal stenosis. No neural foraminal stenosis. IMPRESSION: 1. No acute intracranial abnormality. 2. Advanced atrophy and chronic small vessel ischemic disease. 3. Slightly worsened moderate right C4-5 neural foraminal stenosis due to facet arthrosis. 4. No spinal canal stenosis. Electronically Signed   By: Deatra Robinson M.D.   On: 02/18/2023 01:09   MR CERVICAL SPINE WO CONTRAST  Result Date: 02/18/2023 CLINICAL DATA:  Altered mental status EXAM: MRI HEAD WITHOUT CONTRAST MRI CERVICAL SPINE WITHOUT CONTRAST TECHNIQUE: Multiplanar, multiecho  pulse sequences of the brain and surrounding  structures, and cervical spine, to include the craniocervical junction and cervicothoracic junction, were obtained without intravenous contrast. COMPARISON:  02/12/2023 FINDINGS: MRI HEAD FINDINGS Brain: No acute infarct, mass effect or extra-axial collection. No chronic microhemorrhage or siderosis. There is multifocal hyperintense T2-weighted signal within the white matter. There is advanced atrophy. The midline structures are normal. Vascular: Normal flow voids. Skull and upper cervical spine: Normal marrow signal. Sinuses/Orbits: Bilateral mastoid fluid and fluid-filled nasopharynx. Normal orbits and paranasal sinuses. Other: None MRI CERVICAL SPINE FINDINGS Alignment: Grade 1 anterolisthesis at C4-5 Vertebrae: No fracture, evidence of discitis, or bone lesion. Cord: Normal signal and morphology. Posterior Fossa, vertebral arteries, paraspinal tissues: Endotracheal tube and esophageal catheter present. Normal vertebral artery flow voids. Disc levels: C1-2: Unremarkable. C2-3: Normal disc space and facet joints. There is no spinal canal stenosis. No neural foraminal stenosis. C3-4: Right facet and uncovertebral hypertrophy. There is no spinal canal stenosis. Unchanged mild right neural foraminal stenosis. C4-5: Right-greater-than-left facet hypertrophy. There is no spinal canal stenosis. Slightly worsened moderate right neural foraminal stenosis. C5-6: Normal disc space and facet joints. There is no spinal canal stenosis. No neural foraminal stenosis. C6-7: Normal disc space and facet joints. There is no spinal canal stenosis. No neural foraminal stenosis. C7-T1: Normal disc space and facet joints. There is no spinal canal stenosis. No neural foraminal stenosis. IMPRESSION: 1. No acute intracranial abnormality. 2. Advanced atrophy and chronic small vessel ischemic disease. 3. Slightly worsened moderate right C4-5 neural foraminal stenosis due to facet arthrosis. 4. No  spinal canal stenosis. Electronically Signed   By: Deatra Robinson M.D.   On: 02/18/2023 01:09   DG Chest Port 1 View  Result Date: 02/17/2023 CLINICAL DATA:  [id number] Endotracheal tube present [id number] EXAM: PORTABLE CHEST 1 VIEW COMPARISON:  Chest radiograph dated February 14, 2023. FINDINGS: Endotracheal tube tip is located approximately 2.5 cm above the carina. Right-sided PICC tip overlies the lower SVC. Enteric tube in place. Overlapping telemetry wires. Patient is rotated to the right. Low lung volumes. Airspace opacities again noted in the left lower lobe. Possible small left effusion. No acute osseous abnormality. IMPRESSION: 1. Endotracheal tube tip is located 2.5 cm above the carina. 2. Low lung volumes. Similar left lower lobe airspace disease and possible small left effusion. 3. Additional support devices as above. Electronically Signed   By: Hart Robinsons M.D.   On: 02/17/2023 08:18   DG Chest Port 1 View  Result Date: 02/14/2023 CLINICAL DATA:  [phone] with respiratory failure. EXAM: PORTABLE CHEST 1 VIEW COMPARISON:  Portable chest 02/10/2023 FINDINGS: 4:53 a.m. NGT enters the stomach. The intragastric course is not filmed. ETT tip is 3.9 cm from the carina. Patient is rotated to the right. There is persistent left lower lobe airspace disease and small left pleural effusion. The lungs are hypoexpanded and otherwise clear. The cardiac size is normal. Stable mediastinum with aortic atherosclerosis. No new skeletal findings. IMPRESSION: 1. Persistent left lower lobe airspace disease and small left pleural effusion. 2. Hypoexpanded lungs with no appreciable new abnormality. 3. Support apparatus as above. Electronically Signed   By: Almira Bar M.D.   On: 02/14/2023 05:27   [place] EKG SITE RITE  Result Date: 02/13/2023 If Site Rite image not attached, placement could not be confirmed due to current cardiac rhythm.  MR BRAIN WO CONTRAST  Result Date: 02/12/2023 CLINICAL DATA:  Initial  evaluation for mental status change, unknown cause. EXAM: MRI HEAD WITHOUT CONTRAST TECHNIQUE: Multiplanar, multiecho pulse sequences of the brain and  surrounding structures were obtained without intravenous contrast. COMPARISON:  Prior CT from 02/10/2023. FINDINGS: Brain: Moderately advanced cerebral and cerebellar atrophy. Scattered patchy T2/FLAIR hyperintensity involving the periventricular and deep white matter both cerebral hemispheres, most characteristic of chronic microvascular ischemic disease, moderately advanced in nature. No evidence for acute or subacute ischemia. Gray-white matter differentiation maintained. No acute intracranial hemorrhage. Single chronic microhemorrhage noted at the right occipital lobe. No other chronic intracranial blood products. No mass lesion, midline shift, or mass effect. Diffuse ventricular prominence, favored to be related underlying atrophy, although a degree of NPH could be contributory, and could be considered in the correct clinical setting. No extra-axial fluid collection. Pituitary gland and suprasellar region within normal limits. Vascular: Mildly heterogeneous but preserved flow void within the right V4 segment, suggesting underlying atherosclerotic disease. Major intracranial vascular flow voids are otherwise well maintained. Skull and upper cervical spine: Craniocervical junction with normal limits. Bone marrow signal intensity normal. No scalp soft tissue abnormality. Sinuses/Orbits: Globes and orbital soft tissues within normal limits. Scattered mucosal thickening noted throughout the paranasal sinuses. Small bilateral mastoid effusions. Fluid noted within the nasopharynx. Patient is intubated. Other: None. IMPRESSION: 1. No acute intracranial abnormality. 2. Moderately advanced cerebral atrophy with moderate chronic microvascular ischemic disease. 3. Diffuse ventricular prominence, favored to be related underlying atrophy, although a degree of NPH may be  contributory, and could be considered in the correct clinical setting. Electronically Signed   By: Rise Mu M.D.   On: 02/12/2023 19:15   EEG adult  Result Date: 02/11/2023 Charlsie Quest, MD     02/12/2023  8:08 AM Patient Name: ABUKAR HARP MRN: [id number] Epilepsy Attending: Charlsie Quest Referring Physician/Provider: Lidia Collum, PA-C Date: 02/11/2023 Duration: 24.37 mins Patient history: 68 yo M with ams getting eeg to evaluate for seizure Level of alertness:comatose AEDs during EEG study: None Technical aspects: This EEG study was done with scalp electrodes positioned according to the 10-20 International system of electrode placement. Electrical activity was reviewed with band pass filter of 1-70Hz , sensitivity of 7 uV/mm, display speed of 17mm/sec with a 60Hz  notched filter applied as appropriate. EEG data were recorded continuously and digitally stored.  Video monitoring was available and reviewed as appropriate. Description: EEG showed continuous generalized 3 to 6 Hz theta-delta slowing. Hyperventilation and photic stimulation were not performed.   ABNORMALITY - Continuous slow, generalized IMPRESSION: This study is suggestive of moderate to severe diffuse encephalopathy. No seizures or epileptiform discharges were seen throughout the recording. Charlsie Quest   DG Abd 1 View  Result Date: 02/10/2023 CLINICAL DATA:  NG tube placement EXAM: ABDOMEN - 1 VIEW COMPARISON:  None Available. FINDINGS: NG tube coils in the fundus of the stomach. IMPRESSION: NG tube in the fundus of the stomach. Electronically Signed   By: Charlett Nose M.D.   On: 02/10/2023 17:25   DG CHEST PORT 1 VIEW  Result Date: 02/10/2023 CLINICAL DATA:  ET tube placement EXAM: PORTABLE CHEST 1 VIEW COMPARISON:  02/10/2023 FINDINGS: Endotracheal tube is 5 cm above the carina. NG tube is in the stomach. Increasing left lower lobe airspace opacity. No confluent opacity on the right. No visible effusions or  acute bony abnormality. IMPRESSION: Endotracheal tube 5 cm above the carina. New left lower lobe airspace opacity could reflect atelectasis or pneumonia. Electronically Signed   By: Charlett Nose M.D.   On: 02/10/2023 17:25   CT ABDOMEN PELVIS WO CONTRAST  Result Date: 02/10/2023 CLINICAL DATA:  Sepsis renal  failure, hx of BPH, new foley and hydronephrosis. eval for abscess, prostate inf EXAM: CT ABDOMEN AND PELVIS WITHOUT CONTRAST TECHNIQUE: Multidetector CT imaging of the abdomen and pelvis was performed following the standard protocol without IV contrast. RADIATION DOSE REDUCTION: This exam was performed according to the departmental dose-optimization program which includes automated exposure control, adjustment of the mA and/or kV according to patient size and/or use of iterative reconstruction technique. COMPARISON:  Abdominopelvic CT 12/27/2022 and 02/28/2022. Chest radiographs 02/10/2023. FINDINGS: Lower chest: Images through the lung bases are degraded by breathing artifact. There is new dense left lower lobe consolidation, incompletely visualized, although suspicious for pneumonia. Additional patchy airspace opacities are present within right lung base and lingula. No significant pleural or pericardial effusion. Extensive coronary artery atherosclerosis again noted. Hepatobiliary: Mild hepatic steatosis. No focal abnormalities are identified on noncontrast imaging. Small dependent calcified gallstones are again noted without evidence of gallbladder wall thickening or biliary dilatation. Pancreas: Fatty replacement. No focal abnormality, ductal dilatation or surrounding inflammation. Spleen: Normal in size without focal abnormality. Adrenals/Urinary Tract: Both adrenal glands appear normal. Chronic right sided hydronephrosis and right renal cortical thinning are unchanged status post distal ureteral reimplantation. No evidence of left-sided hydronephrosis or urinary tract calculus. Bilateral renal cysts  are unchanged; no specific follow-up imaging recommended. Grossly stable bladder wall thickening. Foley catheter is in place. Stomach/Bowel: No enteric contrast administered. The stomach appears unremarkable for its degree of distention. No small bowel distension, wall thickening or surrounding inflammation. The appendix appears normal. Possible mild wall thickening of the descending and sigmoid colon versus incomplete distension. No surrounding inflammation identified. Vascular/Lymphatic: There are no enlarged abdominal or pelvic lymph nodes. Stable central mesenteric calcification. Mild aortic and branch vessel atherosclerosis without evidence of aneurysm. Reproductive: Grossly stable moderate enlargement of the prostate gland. Other: Stable postsurgical changes in the anterior abdominal wall and mildly prominent fat in both inguinal canals. No ascites or pneumoperitoneum. Musculoskeletal: Chronic severe atrophy of the right hip musculature. No evidence of acute fracture or aggressive osseous lesion. Mild spondylosis. Unless specific follow-up recommendations are mentioned in the findings or impression sections, no imaging follow-up of any mentioned incidental findings is recommended. IMPRESSION: 1. New dense left lower lobe consolidation, incompletely visualized, although suspicious for pneumonia. Additional patchy airspace opacities within the right lung base and lingula. Radiographic follow up recommended. 2. Possible mild wall thickening of the descending and sigmoid colon versus incomplete distension. Correlate clinically for possible colitis. 3. No other acute findings identified in the abdomen or pelvis. 4. Stable chronic right-sided hydronephrosis and right renal cortical thinning status post distal ureteral reimplantation. 5. Cholelithiasis without evidence of cholecystitis or biliary dilatation. 6. Stable moderate enlargement of the prostate gland with bladder wall thickening, likely due to chronic  bladder outlet obstruction. Foley catheter in place. 7.  Aortic Atherosclerosis (ICD10-I70.0). Electronically Signed   By: Carey Bullocks M.D.   On: 02/10/2023 10:20   CT Head Wo Contrast  Result Date: 02/10/2023 CLINICAL DATA:  Mental status change, unknown cause EXAM: CT HEAD WITHOUT CONTRAST TECHNIQUE: Contiguous axial images were obtained from the base of the skull through the vertex without intravenous contrast. RADIATION DOSE REDUCTION: This exam was performed according to the departmental dose-optimization program which includes automated exposure control, adjustment of the mA and/or kV according to patient size and/or use of iterative reconstruction technique. COMPARISON:  12/27/2022 FINDINGS: Brain: No evidence of acute infarction, hemorrhage, hydrocephalus, extra-axial collection or mass lesion/mass effect. Patchy low-density changes within the periventricular and subcortical white  matter most compatible with chronic microvascular ischemic change. Moderate diffuse cerebral volume loss. Vascular: Atherosclerotic calcifications involving the large vessels of the skull base. No unexpected hyperdense vessel. Skull: Normal. Negative for fracture or focal lesion. Sinuses/Orbits: No acute finding. Other: None. IMPRESSION: 1. No acute intracranial findings. 2. Chronic microvascular ischemic change and cerebral volume loss. Electronically Signed   By: Duanne Guess D.O.   On: 02/10/2023 10:09   DG Chest Port 1 View  Result Date: 02/10/2023 CLINICAL DATA:  68 year old male with possible sepsis. EXAM: PORTABLE CHEST 1 VIEW COMPARISON:  Portable chest 12/31/2022 and earlier. FINDINGS: Portable AP semi upright view at 0732 hours. Mildly rotated to the right. Larger lung volumes. Normal cardiac size and mediastinal contours. Allowing for portable technique the lungs are clear. Skin fold artifact in the right chest. No pneumothorax or pleural effusion. No acute osseous abnormality identified. Negative  visible bowel gas. IMPRESSION: Negative portable chest. Electronically Signed   By: Odessa Fleming M.D.   On: 02/10/2023 07:39    Microbiology Recent Results (from the past 240 hour(s))  CSF culture w Gram Stain     Status: None (Preliminary result)   Collection Time: 02/18/23  4:45 PM   Specimen: PATH Cytology CSF; Cerebrospinal Fluid  Result Value Ref Range Status   Specimen Description   Final    CSF Performed at [facility], [address], [place], [place] [address]    Special Requests   Final    NONE Performed at [facility], [address], [place], [place] [address]    Gram Stain   Final    NO WBC SEEN GRAM POSITIVE COCCI IN PAIRS CYTOSPIN SMEAR CRITICAL RESULT CALLED TO, READ BACK BY AND VERIFIED WITHVictorino Dike [facility] AT 0981 02/18/23 RAM Performed at [facility], [address], [place], [place] [address]    Culture   Final    NO GROWTH 2 DAYS Performed at [facility], [address], [place], [place] [address]    Report Status PENDING  Incomplete    Lab Basic Metabolic Panel: Recent Labs  Lab 02/15/23 0322 02/16/23 0651 02/17/23 0546 02/18/23 0415 02/19/23 0518  NA 136 133* 132* 135 134*  K 3.3* 3.2* 3.3* 3.6 3.7  CL 111 107 107 109 106  CO2 19* 21* 19* 20* 20*  GLUCOSE 138* 191* 178* 210* 174*  BUN 46* 40* 37* 34* 30*  CREATININE 0.85 0.82 0.78 0.59* 0.74  CALCIUM 7.3* 7.2* 7.4* 7.7* 7.8*  MG 1.8 1.9  --   --  1.5*  PHOS <1.0* 2.0* 2.6 2.4* 3.3   Liver Function Tests: Recent Labs  Lab 02/14/23 0714 02/15/23 0322 02/16/23 0651 02/17/23 0546  AST 34 29 21 21   ALT 26 23 21 21   ALKPHOS 387* 331* 340* 308*  BILITOT 1.0 0.7 0.6 0.6  PROT 5.1* 4.4* 4.7* 4.5*  ALBUMIN 1.8* 1.6* 1.6* 1.5*   No results for input(s): "LIPASE", "AMYLASE" in the last 168 hours. Recent Labs  Lab 02/17/23 1111  AMMONIA 25   CBC: Recent Labs  Lab 02/15/23 0322 02/16/23 0651 02/17/23 0546 02/18/23 0415  02/19/23 0518  WBC 1.6* 2.9* 3.9* 5.1 8.5  NEUTROABS  --  1.6* 2.4  --   --   HGB 10.6* 10.3* 9.6* 9.6* 9.4*  HCT 33.6* 30.7* 28.8* 29.5* 28.4*  MCV 89.1 86.5 86.2 88.3 86.1  PLT 25* 49* 86* 121* 181   Cardiac Enzymes: Recent Labs  Lab 02/18/23  0415  CKTOTAL 25*   Sepsis Labs: Recent Labs  Lab 02/16/23 0651 02/17/23 0546 02/18/23 0415 02/19/23 0518  WBC 2.9* 3.9* 5.1 8.5    Procedures/Operations  11/25-12/3 ETT 11/29 PICC 12/3 LP   Kaylyn Layer Lamiyah Schlotter 03/08/2023, 4:16 PM

## 2023-03-19 NOTE — Progress Notes (Signed)
NAME:  [name], MRN:  [id number], DOB:  [birth year], LOS: 10 ADMISSION DATE:  02/10/2023, CONSULTATION DATE:  11/25 REFERRING MD:  Dr. Rhunette Croft, CHIEF COMPLAINT:  acute respiratory failure w/ hypoxia; encephalopathy   History of Present Illness:  Patient is a 68 yo M w/ pertinent PMH dementia, IDDM-2, HTN, HLD and mood disorder presents to [facility] ED on 11/25 w/ ams.  Patient recently admitted last month w/ confusion/lethargy. Found to have severe sepsis from staph aureus uti. Discharged back to SNF. On 11/25 patient w/ AMS. On ems arrival patient sob and unresponsive. Sats 84% and started on supplemental O2. Transported to [facility] ED.  On arrival to [facility] ED on 11/25, patient remains altered. Febrile 100.5 and tachycardic 120s. BP stable. LA 3.8 and given IV fluids. Placed on NRB w/ sats low 90s. CBG 51 given dextrose. Cultures obtained and started on broad spectrum abx. Vbg 7.38, 41, 31, 24. CXR and CT head w/ no significant findings. Patient having diarrhea while in ED. CT abd/pelvis showing dense LLL consolidation and patchy opacities in Right lung base suspicious for pna; mild wall thickening of descending colon/sigmoid colon possible colitis; stable chronic right sided hydronephrosis. Covid/flu negative. UA w/ large leukocytes and few bacteria. Cdiff antigen showing positive antigen and negative toxin; PCR pending. Patient remained encephalopathic and BP remains soft despite IV fluids. On HHFNC 80% fio2. ED plans to intubate given increase in o2 needs and poor mental status. PCCM consulted for icu admission.  Pertinent ED labs: creat 3.72, ast 58, alk phosph 204, AG 17, ammonia 18   Pertinent  Medical History   has a past medical history of Acute kidney injury superimposed on chronic kidney disease (HCC) (02/10/2020), Acute on chronic kidney failure (HCC) (09/28/2019), Closed nondisplaced spiral fracture of shaft of right tibia, Community acquired pneumonia (03/24/2021), Complicated urinary tract  infection (09/06/2016), Depression, Diabetes mellitus without complication (HCC), GERD (gastroesophageal reflux disease), Gout, Hemorrhoids, Hypertension, Macular degeneration, Microcytic anemia, PNA (pneumonia) (03/26/2021), Prostate cancer (HCC) (08/2009), and Tubular adenoma.   Significant Hospital Events: Including procedures, antibiotic start and stop dates in addition to other pertinent events   11/25 admitted w/ ams and respiratory failure w/ hypoxia 11/26 EEG with moderate to severe diffuse encephalopathy, no seizures or epileptiform discharges 11/27 MRI with chronic microvascular changes, no acute stroke 12/1 tolerating weaning pressure support 10/5 12/2 MRI brain and Cspine > no acute process, advanced atrophy, slightly worsened moderate right C4-5 neural foraminal stenosis due to facet arthrosis 12/3 Extubation to BIPAP, LP performed  12/4 Patient on BIPAP overnight, vomited into BIPAP early morning. CXR obtained. 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 Transition to comfort care   Interim History / Subjective:   RR 20-30  Objective   Blood pressure 106/68, pulse (!) 112, temperature 98.1 F (36.7 C), temperature source Axillary, resp. rate (!) 21, height 5\' 6"  (1.676 m), weight 75.3 kg, SpO2 98%.    FiO2 (%):  [100 %] 100 %   Intake/Output Summary (Last 24 hours) at 02/23/2023 0843 Last data filed at 02/17/2023 0303 Gross per 24 hour  Intake 760 ml  Output 300 ml  Net 460 ml   Filed Weights   02/17/23 0500 02/18/23 0321 02/19/23 0500  Weight: 73.9 kg 75 kg 75.3 kg    Examination: General: Chronically and acutely ill older adult M  HEENT:NCAT  Neuro: Lethargic  CV: tachycardic  PULM: incr RR no accessory muscle use  GU: foley  Extremities: No obvious acute joint deformity  Skin: Pale   Resolved Hospital Problem list   Septic shock Transaminitis  Assessment & Plan:   Goals of care discussion Encounter for palliative care DNR status Acute metabolic  encephalopathy Acute hypoxic respiratory failure Severe sepsis, associated encephalopathy -- Possible Bacterial Meningitis  C Diff Colitis MRSA and VRE UTI  Metabolic acidosis  IDDM with hyperglycemia  Hypokalemia Hypomagnesemia  Hypophosphatemia  Hyponatremia  Hypocalcemia  Pancytopenia, improved Anemia  Hypertriglyceridemia  Urinary retention Hx hydronephrosis Hx Dementia with behavioral disturbance  P -DNR -transitioned to comfort care 12/4 -cont PRN ativan, escalating PRN morphine IVP to gtt + PRN bolus  -will ask [facility] to take over care 12/6        Best Practice (right click and "Reselect all SmartList Selections" daily)   Diet/type: tubefeeds DVT prophylaxis: na  Pressure ulcer(s): not present on admission  GI prophylaxis: H2B Lines: N/A Foley:  Yes, and it is still needed Code Status:  DNR;    CCT: n/a  Low MDM    Tessie Fass MSN, AGACNP-BC [facility] Amion for pager  03/10/2023, 8:43 AM

## 2023-03-19 NOTE — Progress Notes (Signed)
Spoke with sister at bedside Talked about changes we are making with his medications based on some symptoms that are not yet adequately controlled. She was wrestling with his stable-appearing VS overnight vs end of life care. There were hopes that his stable-appearing VS meant that prayer was healing Theodis-- unfortunately this is not the case and Ignatz is actively dying. We talked about this extensively.   Provided a lot of emotional support. She asked about dc to hospice -- I recommend continuing current inpatient management as I am not sure that Laryan would survive transport. I think we need to first appropriately manage his EOL sx before we could deem him stable enough for an ambulance ride, and with his periods of hypoxia desatting to 70s, I am skeptical. We are making efforts to transfer to a palliative floor within the hospital.   She is in agreement with this plan. All questions answered.    Tessie Fass MSN, AGACNP-BC [facility] 03/12/2023, 11:28 AM

## 2023-03-19 DEATH — deceased

## 2023-08-19 IMAGING — MR MR HEAD W/O CM
6 of 10 series · 27 of 48 positions shown · non-contrast
Comparison: MRI 10/25/2020

CLINICAL DATA: Dizziness, nonspecific.

EXAM:
MRI HEAD WITHOUT CONTRAST
TECHNIQUE: Multiplanar, multiecho pulse sequences of the brain and surrounding
structures were obtained without intravenous contrast.

[Series 2: DWI · axial · 3.0mm · 0.94mm/px · z∈[-88,+71]mm · 8 of 108 slices shown (1 of 2)]
[im 1/108]
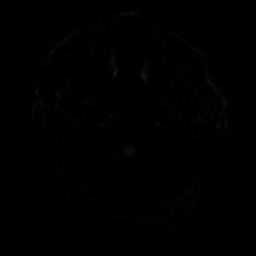
[im 12/108]
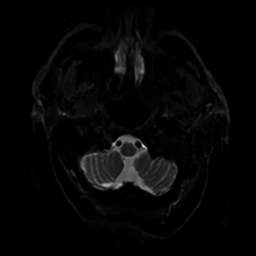
[im 36/108]
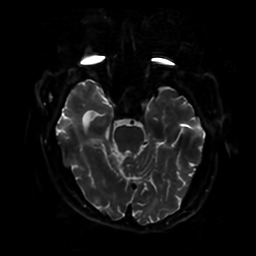
[im 48/108]
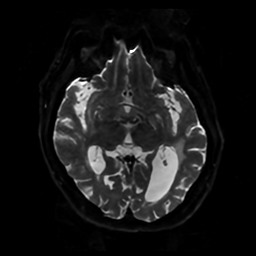
[im 60/108]
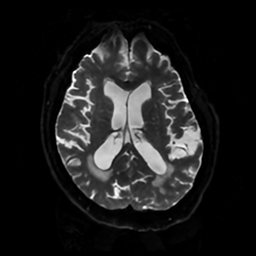
[im 72/108]
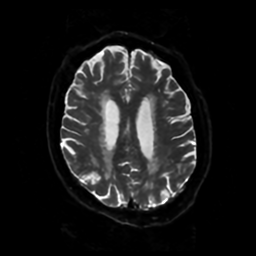
[im 96/108]
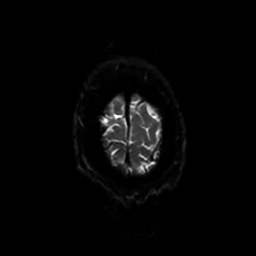
[im 108/108]
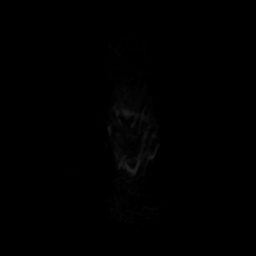

[Series 3: DWI · coronal · 4.0mm · 0.94mm/px · 6 of 74 slices shown (2 of 2)]
[im 1/74]
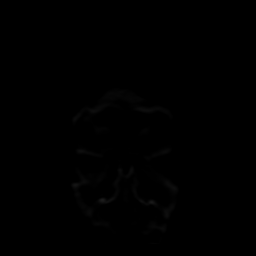
[im 15/74]
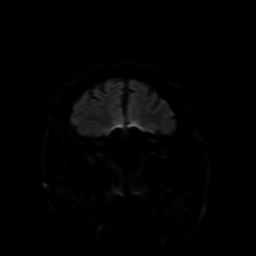
[im 30/74]
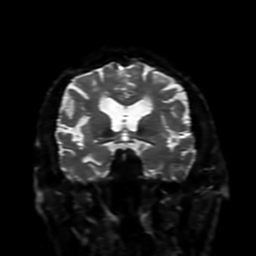
[im 44/74]
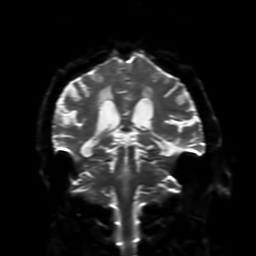
[im 59/74]
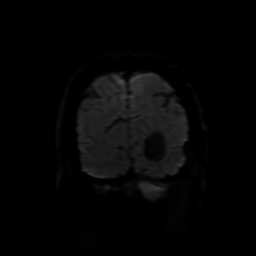
[im 74/74]
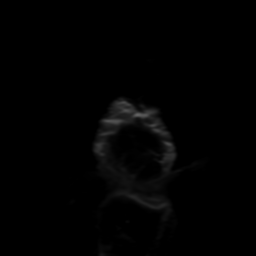

[Series 4: FLAIR · sagittal · 5.0mm · 0.23mm/px · 2 of 25 slices shown (1 of 2)]
[im 1/25]
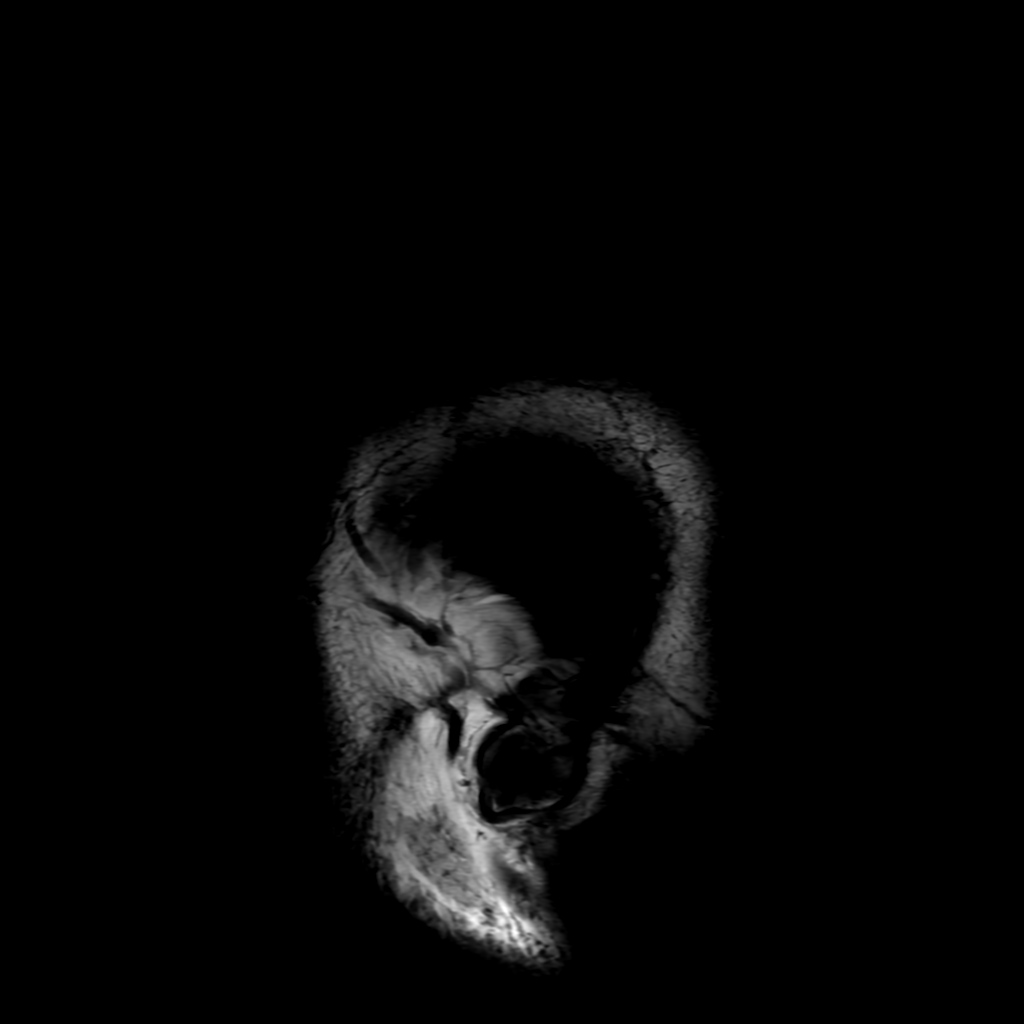
[im 25/25]
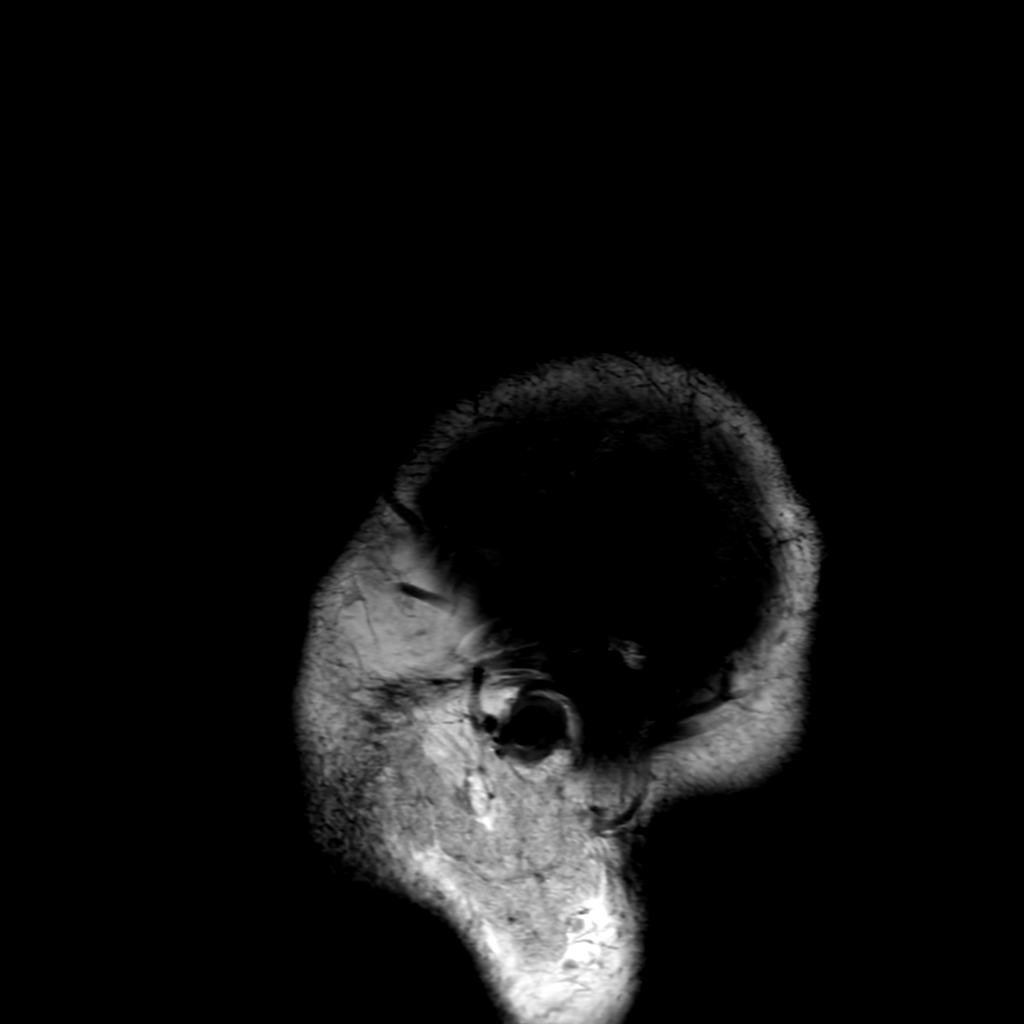

[Series 5: FLAIR · axial · 4.0mm · 0.45mm/px · z∈[-87,+71]mm · 3 of 37 slices shown (2 of 2)]
[im 1/37]
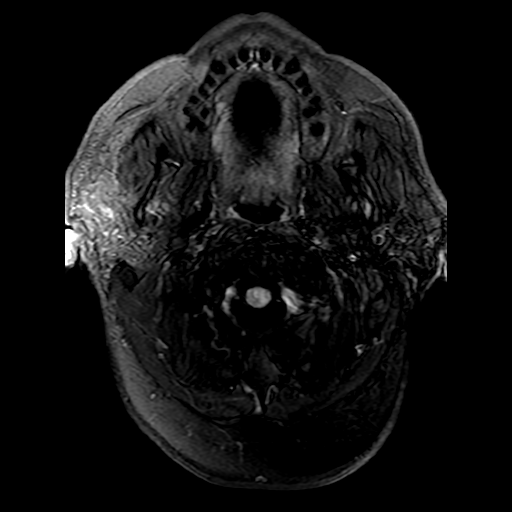
[im 19/37]
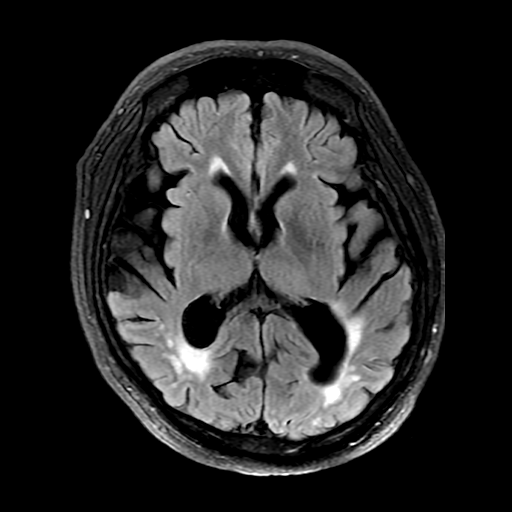
[im 37/37]
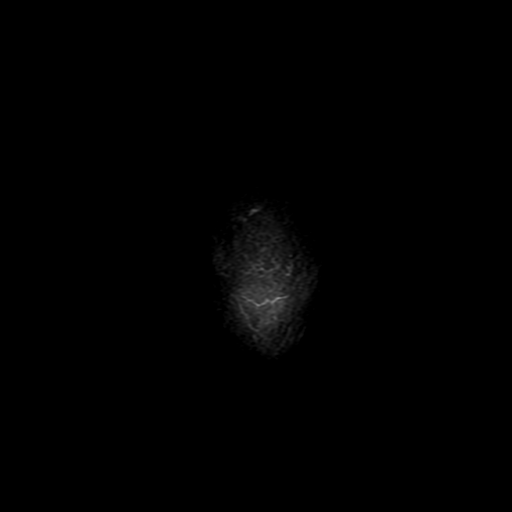

[Series 250: ADC · axial · 3.0mm · 0.94mm/px · z∈[-88,+71]mm · 5 of 54 slices shown (1 of 2)]
[im 1/54]
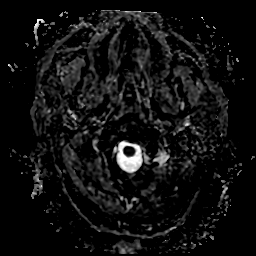
[im 14/54]
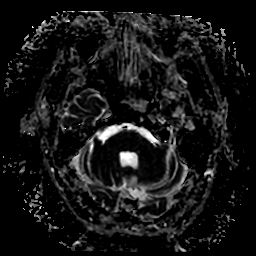
[im 27/54]
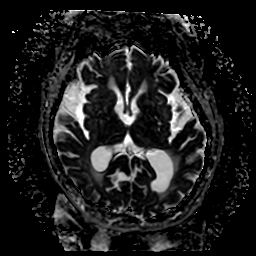
[im 40/54]
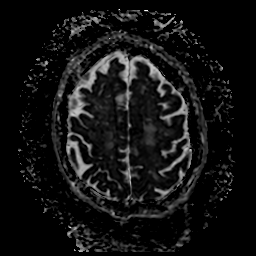
[im 54/54]
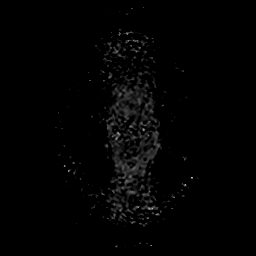

[Series 350: ADC · coronal · 4.0mm · 0.94mm/px · 3 of 37 slices shown (2 of 2)]
[im 1/37]
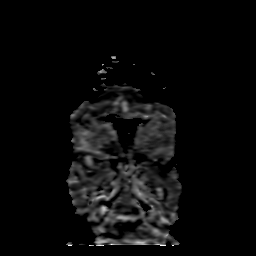
[im 19/37]
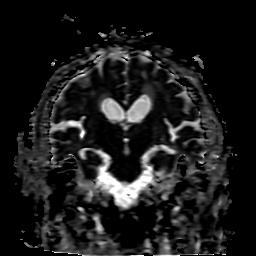
[im 37/37]
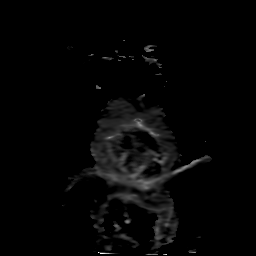

[27 of 48 positions shown; findings below may reference images not displayed]

FINDINGS: Brain: Diffusion imaging does not show any acute or subacute
infarction or other cause of restricted diffusion. No focal
abnormality affects the brainstem. Cerebellar volume loss without
focal insult. Cerebral hemispheres show generalized atrophy with
moderate to severe chronic small-vessel ischemic changes throughout
the white matter. No large vessel territory stroke. No mass lesion,
hemorrhage, hydrocephalus or extra-axial collection. No discernible
change since the study [DATE].

Vascular: Major vessels at the base of the brain show flow.

Skull and upper cervical spine: Negative

Sinuses/Orbits: Clear/normal

Other: No fluid in the middle ears. Very tiny mastoid effusions, not
likely significant.
IMPRESSION: No change since [REDACTED] of this year. No acute or reversible finding.
Moderate to severe chronic small-vessel ischemic changes of the
cerebral hemispheric white matter.
